# Patient Record
Sex: Male | Born: 1955
Health system: Southern US, Community
[De-identification: ages and names within clinical notes are randomized; demographics above are authoritative.]

## PROBLEM LIST (undated history)

## (undated) DIAGNOSIS — R011 Cardiac murmur, unspecified: Secondary | ICD-10-CM

## (undated) DIAGNOSIS — I5032 Chronic diastolic (congestive) heart failure: Secondary | ICD-10-CM

## (undated) DIAGNOSIS — E669 Obesity, unspecified: Secondary | ICD-10-CM

## (undated) DIAGNOSIS — F32A Depression, unspecified: Secondary | ICD-10-CM

## (undated) DIAGNOSIS — E785 Hyperlipidemia, unspecified: Secondary | ICD-10-CM

## (undated) DIAGNOSIS — I2510 Atherosclerotic heart disease of native coronary artery without angina pectoris: Secondary | ICD-10-CM

## (undated) DIAGNOSIS — I6529 Occlusion and stenosis of unspecified carotid artery: Secondary | ICD-10-CM

## (undated) DIAGNOSIS — N329 Bladder disorder, unspecified: Secondary | ICD-10-CM

## (undated) DIAGNOSIS — I219 Acute myocardial infarction, unspecified: Secondary | ICD-10-CM

## (undated) DIAGNOSIS — I712 Thoracic aortic aneurysm, without rupture: Secondary | ICD-10-CM

## (undated) DIAGNOSIS — I350 Nonrheumatic aortic (valve) stenosis: Secondary | ICD-10-CM

## (undated) DIAGNOSIS — K219 Gastro-esophageal reflux disease without esophagitis: Secondary | ICD-10-CM

## (undated) DIAGNOSIS — R42 Dizziness and giddiness: Secondary | ICD-10-CM

## (undated) DIAGNOSIS — C801 Malignant (primary) neoplasm, unspecified: Secondary | ICD-10-CM

## (undated) DIAGNOSIS — G4730 Sleep apnea, unspecified: Secondary | ICD-10-CM

## (undated) DIAGNOSIS — M1990 Unspecified osteoarthritis, unspecified site: Secondary | ICD-10-CM

## (undated) DIAGNOSIS — M2550 Pain in unspecified joint: Secondary | ICD-10-CM

## (undated) DIAGNOSIS — Z860100 Personal history of colon polyps, unspecified: Secondary | ICD-10-CM

## (undated) DIAGNOSIS — R0600 Dyspnea, unspecified: Secondary | ICD-10-CM

## (undated) DIAGNOSIS — G2581 Restless legs syndrome: Secondary | ICD-10-CM

## (undated) DIAGNOSIS — F329 Major depressive disorder, single episode, unspecified: Secondary | ICD-10-CM

## (undated) DIAGNOSIS — I10 Essential (primary) hypertension: Secondary | ICD-10-CM

## (undated) DIAGNOSIS — D126 Benign neoplasm of colon, unspecified: Secondary | ICD-10-CM

## (undated) DIAGNOSIS — R7303 Prediabetes: Secondary | ICD-10-CM

## (undated) DIAGNOSIS — H269 Unspecified cataract: Secondary | ICD-10-CM

## (undated) DIAGNOSIS — F419 Anxiety disorder, unspecified: Secondary | ICD-10-CM

## (undated) DIAGNOSIS — I7121 Aneurysm of the ascending aorta, without rupture: Secondary | ICD-10-CM

## (undated) DIAGNOSIS — T8859XA Other complications of anesthesia, initial encounter: Secondary | ICD-10-CM

## (undated) HISTORY — DX: Anxiety disorder, unspecified: F41.9

## (undated) HISTORY — PX: CATARACT EXTRACTION: SUR2

## (undated) HISTORY — DX: Aneurysm of the ascending aorta, without rupture: I71.21

## (undated) HISTORY — DX: Thoracic aortic aneurysm, without rupture: I71.2

## (undated) HISTORY — DX: Depression, unspecified: F32.A

## (undated) HISTORY — DX: Nonrheumatic aortic (valve) stenosis: I35.0

## (undated) HISTORY — DX: Occlusion and stenosis of unspecified carotid artery: I65.29

## (undated) HISTORY — DX: Chronic diastolic (congestive) heart failure: I50.32

## (undated) HISTORY — DX: Personal history of colon polyps, unspecified: Z86.0100

## (undated) HISTORY — DX: Essential (primary) hypertension: I10

## (undated) HISTORY — DX: Atherosclerotic heart disease of native coronary artery without angina pectoris: I25.10

## (undated) HISTORY — DX: Hyperlipidemia, unspecified: E78.5

## (undated) HISTORY — PX: OTHER SURGICAL HISTORY: SHX169

## (undated) HISTORY — PX: TONSILLECTOMY: SUR1361

## (undated) HISTORY — DX: Unspecified osteoarthritis, unspecified site: M19.90

## (undated) HISTORY — DX: Benign neoplasm of colon, unspecified: D12.6

## (undated) HISTORY — DX: Obesity, unspecified: E66.9

## (undated) HISTORY — DX: Personal history of colonic polyps: Z86.010

## (undated) HISTORY — DX: Cardiac murmur, unspecified: R01.1

## (undated) HISTORY — DX: Major depressive disorder, single episode, unspecified: F32.9

## (undated) SURGERY — ECHOCARDIOGRAM, TRANSESOPHAGEAL
Anesthesia: Moderate Sedation

---
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
## 1997-08-16 ENCOUNTER — Other Ambulatory Visit: Admission: RE | Admit: 1997-08-16 | Discharge: 1997-08-16 | Payer: Self-pay | Admitting: Orthopedic Surgery

## 1998-01-01 ENCOUNTER — Ambulatory Visit (HOSPITAL_COMMUNITY): Admission: RE | Admit: 1998-01-01 | Discharge: 1998-01-01 | Payer: Self-pay | Admitting: Family Medicine

## 1998-02-19 ENCOUNTER — Ambulatory Visit (HOSPITAL_BASED_OUTPATIENT_CLINIC_OR_DEPARTMENT_OTHER): Admission: RE | Admit: 1998-02-19 | Discharge: 1998-02-19 | Payer: Self-pay | Admitting: Orthopaedic Surgery

## 2004-03-10 ENCOUNTER — Ambulatory Visit: Payer: Self-pay | Admitting: Family Medicine

## 2004-03-17 ENCOUNTER — Ambulatory Visit: Payer: Self-pay | Admitting: Family Medicine

## 2004-03-24 ENCOUNTER — Ambulatory Visit: Payer: Self-pay

## 2004-03-26 ENCOUNTER — Ambulatory Visit: Payer: Self-pay | Admitting: Gastroenterology

## 2004-04-05 ENCOUNTER — Ambulatory Visit: Payer: Self-pay | Admitting: Gastroenterology

## 2004-04-08 ENCOUNTER — Ambulatory Visit: Payer: Self-pay | Admitting: Family Medicine

## 2004-07-02 ENCOUNTER — Ambulatory Visit: Payer: Self-pay | Admitting: Internal Medicine

## 2005-03-23 ENCOUNTER — Ambulatory Visit: Payer: Self-pay | Admitting: Family Medicine

## 2005-03-25 ENCOUNTER — Emergency Department (HOSPITAL_COMMUNITY): Admission: EM | Admit: 2005-03-25 | Discharge: 2005-03-25 | Payer: Self-pay | Admitting: Emergency Medicine

## 2005-03-28 ENCOUNTER — Ambulatory Visit: Payer: Self-pay | Admitting: Family Medicine

## 2005-04-05 ENCOUNTER — Ambulatory Visit: Payer: Self-pay

## 2005-04-11 ENCOUNTER — Ambulatory Visit: Payer: Self-pay

## 2005-06-23 ENCOUNTER — Encounter: Admission: RE | Admit: 2005-06-23 | Discharge: 2005-06-23 | Payer: Self-pay | Admitting: Family Medicine

## 2005-07-07 ENCOUNTER — Ambulatory Visit: Payer: Self-pay | Admitting: Family Medicine

## 2005-08-01 ENCOUNTER — Encounter: Admission: RE | Admit: 2005-08-01 | Discharge: 2005-08-01 | Payer: Self-pay | Admitting: Otolaryngology

## 2005-08-01 ENCOUNTER — Other Ambulatory Visit: Admission: RE | Admit: 2005-08-01 | Discharge: 2005-08-01 | Payer: Self-pay | Admitting: Interventional Radiology

## 2005-08-01 ENCOUNTER — Encounter (INDEPENDENT_AMBULATORY_CARE_PROVIDER_SITE_OTHER): Payer: Self-pay | Admitting: *Deleted

## 2005-11-17 ENCOUNTER — Ambulatory Visit: Payer: Self-pay | Admitting: Family Medicine

## 2007-01-06 ENCOUNTER — Emergency Department (HOSPITAL_COMMUNITY): Admission: EM | Admit: 2007-01-06 | Discharge: 2007-01-06 | Payer: Self-pay | Admitting: Emergency Medicine

## 2007-01-09 DIAGNOSIS — F411 Generalized anxiety disorder: Secondary | ICD-10-CM | POA: Insufficient documentation

## 2007-01-25 ENCOUNTER — Telehealth: Payer: Self-pay | Admitting: Family Medicine

## 2007-03-16 ENCOUNTER — Ambulatory Visit: Payer: Self-pay | Admitting: Family Medicine

## 2007-03-16 LAB — CONVERTED CEMR LAB
Glucose, Urine, Semiquant: NEGATIVE
Nitrite: NEGATIVE
Protein, U semiquant: NEGATIVE
Specific Gravity, Urine: 1.015
Urobilinogen, UA: 0.2
WBC Urine, dipstick: NEGATIVE

## 2007-03-20 LAB — CONVERTED CEMR LAB
AST: 18 units/L (ref 0–37)
Alkaline Phosphatase: 82 units/L (ref 39–117)
Basophils Relative: 0.5 % (ref 0.0–1.0)
CO2: 30 meq/L (ref 19–32)
Chloride: 105 meq/L (ref 96–112)
Creatinine, Ser: 1 mg/dL (ref 0.4–1.5)
Direct LDL: 145.6 mg/dL
HCT: 45.4 % (ref 39.0–52.0)
Hemoglobin: 16 g/dL (ref 13.0–17.0)
Monocytes Absolute: 0.5 10*3/uL (ref 0.2–0.7)
Neutrophils Relative %: 58.8 % (ref 43.0–77.0)
PSA: 1.74 ng/mL (ref 0.10–4.00)
Potassium: 5 meq/L (ref 3.5–5.1)
RDW: 12.6 % (ref 11.5–14.6)
Sodium: 142 meq/L (ref 135–145)
Total Bilirubin: 1.6 mg/dL — ABNORMAL HIGH (ref 0.3–1.2)
Total CHOL/HDL Ratio: 5.5
Total Protein: 7.1 g/dL (ref 6.0–8.3)
VLDL: 43 mg/dL — ABNORMAL HIGH (ref 0–40)
WBC: 7.6 10*3/uL (ref 4.5–10.5)

## 2007-03-23 ENCOUNTER — Ambulatory Visit: Payer: Self-pay | Admitting: Family Medicine

## 2007-03-27 ENCOUNTER — Telehealth: Payer: Self-pay | Admitting: Family Medicine

## 2007-04-04 ENCOUNTER — Telehealth (INDEPENDENT_AMBULATORY_CARE_PROVIDER_SITE_OTHER): Payer: Self-pay | Admitting: *Deleted

## 2007-10-03 ENCOUNTER — Telehealth: Payer: Self-pay | Admitting: Family Medicine

## 2008-02-07 ENCOUNTER — Ambulatory Visit: Payer: Self-pay | Admitting: Family Medicine

## 2008-02-07 DIAGNOSIS — I6529 Occlusion and stenosis of unspecified carotid artery: Secondary | ICD-10-CM | POA: Insufficient documentation

## 2008-02-07 DIAGNOSIS — D126 Benign neoplasm of colon, unspecified: Secondary | ICD-10-CM | POA: Insufficient documentation

## 2008-02-08 ENCOUNTER — Encounter: Payer: Self-pay | Admitting: Family Medicine

## 2008-02-22 ENCOUNTER — Ambulatory Visit: Payer: Self-pay

## 2008-02-22 ENCOUNTER — Encounter: Payer: Self-pay | Admitting: Family Medicine

## 2008-05-16 ENCOUNTER — Ambulatory Visit: Payer: Self-pay | Admitting: Family Medicine

## 2008-05-19 ENCOUNTER — Telehealth: Payer: Self-pay | Admitting: Family Medicine

## 2008-05-20 ENCOUNTER — Telehealth: Payer: Self-pay | Admitting: Gastroenterology

## 2008-05-22 ENCOUNTER — Ambulatory Visit: Payer: Self-pay | Admitting: Gastroenterology

## 2008-05-22 DIAGNOSIS — K648 Other hemorrhoids: Secondary | ICD-10-CM | POA: Insufficient documentation

## 2008-05-22 LAB — CONVERTED CEMR LAB
Basophils Absolute: 0 10*3/uL (ref 0.0–0.1)
Basophils Relative: 0 % (ref 0.0–3.0)
Eosinophils Absolute: 0.2 10*3/uL (ref 0.0–0.7)
Ferritin: 123.3 ng/mL (ref 22.0–322.0)
Hemoglobin: 15.4 g/dL (ref 13.0–17.0)
Iron: 43 ug/dL (ref 42–165)
Lymphocytes Relative: 27.8 % (ref 12.0–46.0)
MCHC: 35.2 g/dL (ref 30.0–36.0)
MCV: 89.9 fL (ref 78.0–100.0)
Neutro Abs: 6 10*3/uL (ref 1.4–7.7)
RDW: 12.8 % (ref 11.5–14.6)

## 2008-05-23 ENCOUNTER — Ambulatory Visit: Payer: Self-pay | Admitting: Gastroenterology

## 2008-05-23 HISTORY — PX: OTHER SURGICAL HISTORY: SHX169

## 2008-06-18 ENCOUNTER — Telehealth: Payer: Self-pay | Admitting: Gastroenterology

## 2008-06-24 ENCOUNTER — Encounter: Payer: Self-pay | Admitting: Family Medicine

## 2008-07-18 ENCOUNTER — Telehealth: Payer: Self-pay | Admitting: Family Medicine

## 2008-08-01 ENCOUNTER — Ambulatory Visit: Payer: Self-pay | Admitting: Family Medicine

## 2008-08-01 DIAGNOSIS — N410 Acute prostatitis: Secondary | ICD-10-CM

## 2008-08-01 LAB — CONVERTED CEMR LAB
Bilirubin Urine: NEGATIVE
Glucose, Urine, Semiquant: NEGATIVE
Ketones, urine, test strip: NEGATIVE
Protein, U semiquant: NEGATIVE
Urobilinogen, UA: 0.2
pH: 7.5

## 2008-10-24 ENCOUNTER — Telehealth: Payer: Self-pay | Admitting: Family Medicine

## 2009-02-13 ENCOUNTER — Encounter (INDEPENDENT_AMBULATORY_CARE_PROVIDER_SITE_OTHER): Payer: Self-pay | Admitting: *Deleted

## 2009-03-04 ENCOUNTER — Telehealth: Payer: Self-pay | Admitting: Family Medicine

## 2009-03-13 ENCOUNTER — Ambulatory Visit (HOSPITAL_COMMUNITY): Admission: RE | Admit: 2009-03-13 | Discharge: 2009-03-14 | Payer: Self-pay | Admitting: Orthopaedic Surgery

## 2009-03-23 HISTORY — PX: NECK SURGERY: SHX720

## 2009-04-06 ENCOUNTER — Telehealth: Payer: Self-pay | Admitting: Gastroenterology

## 2009-04-07 ENCOUNTER — Ambulatory Visit: Payer: Self-pay | Admitting: Gastroenterology

## 2009-04-23 ENCOUNTER — Ambulatory Visit: Payer: Self-pay | Admitting: Gastroenterology

## 2009-04-23 DIAGNOSIS — K602 Anal fissure, unspecified: Secondary | ICD-10-CM | POA: Insufficient documentation

## 2009-09-28 ENCOUNTER — Encounter: Admission: RE | Admit: 2009-09-28 | Discharge: 2009-09-28 | Payer: Self-pay | Admitting: Family Medicine

## 2009-10-07 ENCOUNTER — Telehealth: Payer: Self-pay | Admitting: Family Medicine

## 2010-04-25 DIAGNOSIS — I219 Acute myocardial infarction, unspecified: Secondary | ICD-10-CM

## 2010-04-25 HISTORY — PX: CORONARY ANGIOPLASTY: SHX604

## 2010-04-25 HISTORY — DX: Acute myocardial infarction, unspecified: I21.9

## 2010-05-25 NOTE — Progress Notes (Signed)
Summary: refill Valium  Phone Note Refill Request Message from:  Fax from Pharmacy on October 07, 2009 3:03 PM  Refills Requested: Medication #1:  VALIUM 5 MG  TABS two times a day as needed anxiety   Dosage confirmed as above?Dosage Confirmed   Supply Requested: 1 month   Last Refilled: 04/27/2009  Method Requested: Fax to Local Pharmacy Initial call taken by: Raechel Ache, RN,  October 07, 2009 3:04 PM Caller: [facility]  [address] [facility]  Follow-up for Phone Call        call in #60 with 2 rf. He needs to see me soon, it has been over a year Follow-up by: Nelwyn Salisbury MD,  October 08, 2009 8:32 AM  Additional Follow-up for Phone Call Additional follow up Details #1::        Rx faxed to pharmacy Additional Follow-up by: Raechel Ache, RN,  October 08, 2009 9:44 AM    Prescriptions: VALIUM 5 MG  TABS (DIAZEPAM) two times a day as needed anxiety  #60 x 2   Entered by:   Raechel Ache, RN   Authorized by:   Nelwyn Salisbury MD   Signed by:   Raechel Ache, RN on 10/08/2009   Method used:   Historical   RxID:   [id number]

## 2010-07-28 ENCOUNTER — Other Ambulatory Visit: Payer: Self-pay

## 2010-07-28 LAB — DIFFERENTIAL
Basophils Absolute: 0 10*3/uL (ref 0.0–0.1)
Basophils Relative: 0 % (ref 0–1)
Eosinophils Relative: 3 % (ref 0–5)
Lymphocytes Relative: 30 % (ref 12–46)
Monocytes Absolute: 0.5 10*3/uL (ref 0.1–1.0)
Neutro Abs: 5.3 10*3/uL (ref 1.7–7.7)

## 2010-07-28 LAB — CBC
HCT: 45.7 % (ref 39.0–52.0)
Hemoglobin: 15.8 g/dL (ref 13.0–17.0)
MCV: 92.1 fL (ref 78.0–100.0)
Platelets: 172 10*3/uL (ref 150–400)
RBC: 4.96 MIL/uL (ref 4.22–5.81)
WBC: 8.7 10*3/uL (ref 4.0–10.5)

## 2010-07-28 LAB — COMPREHENSIVE METABOLIC PANEL
Albumin: 4 g/dL (ref 3.5–5.2)
Alkaline Phosphatase: 84 U/L (ref 39–117)
BUN: 12 mg/dL (ref 6–23)
CO2: 27 mEq/L (ref 19–32)
Chloride: 105 mEq/L (ref 96–112)
Creatinine, Ser: 0.91 mg/dL (ref 0.4–1.5)
GFR calc non Af Amer: >60 mL/min (ref >60)
Potassium: 4.4 mEq/L (ref 3.5–5.1)
Total Bilirubin: 0.8 mg/dL (ref 0.3–1.2)

## 2010-07-28 LAB — URINALYSIS, ROUTINE W REFLEX MICROSCOPIC
Bilirubin Urine: NEGATIVE
Ketones, ur: NEGATIVE mg/dL
Leukocytes, UA: NEGATIVE
Nitrite: NEGATIVE
Specific Gravity, Urine: 1.005 (ref 1.005–1.030)
Urobilinogen, UA: 0.2 mg/dL (ref 0.0–1.0)
pH: 6.5 (ref 5.0–8.0)

## 2010-07-28 NOTE — Telephone Encounter (Signed)
Called [facility] and rx denied

## 2010-07-28 NOTE — Telephone Encounter (Signed)
NO, he needs an OV. I have not seen him in 2 years

## 2010-11-22 ENCOUNTER — Encounter: Payer: Self-pay | Admitting: Family Medicine

## 2010-11-22 ENCOUNTER — Ambulatory Visit (INDEPENDENT_AMBULATORY_CARE_PROVIDER_SITE_OTHER): Payer: BC Managed Care – PPO | Admitting: Family Medicine

## 2010-11-22 DIAGNOSIS — R319 Hematuria, unspecified: Secondary | ICD-10-CM

## 2010-11-22 DIAGNOSIS — R079 Chest pain, unspecified: Secondary | ICD-10-CM

## 2010-11-22 DIAGNOSIS — R011 Cardiac murmur, unspecified: Secondary | ICD-10-CM

## 2010-11-22 LAB — BASIC METABOLIC PANEL
BUN: 15 mg/dL (ref 6–23)
Calcium: 9.3 mg/dL (ref 8.4–10.5)
GFR: 100.16 mL/min (ref >60.00)
Glucose, Bld: 80 mg/dL (ref 70–99)

## 2010-11-22 LAB — CBC WITH DIFFERENTIAL/PLATELET
Eosinophils Relative: 6.4 % — ABNORMAL HIGH (ref 0.0–5.0)
HCT: 44.9 % (ref 39.0–52.0)
Lymphocytes Relative: 26.2 % (ref 12.0–46.0)
Lymphs Abs: 2.4 10*3/uL (ref 0.7–4.0)
Monocytes Relative: 6.3 % (ref 3.0–12.0)
Neutrophils Relative %: 60.7 % (ref 43.0–77.0)
Platelets: 156 10*3/uL (ref 150.0–400.0)
WBC: 9.2 10*3/uL (ref 4.5–10.5)

## 2010-11-22 LAB — POCT URINALYSIS DIPSTICK
Bilirubin, UA: NEGATIVE
Glucose, UA: NEGATIVE
Ketones, UA: NEGATIVE
Leukocytes, UA: NEGATIVE
Nitrite, UA: NEGATIVE

## 2010-11-22 LAB — TSH: TSH: 1.23 u[IU]/mL (ref 0.35–5.50)

## 2010-11-22 LAB — HEPATIC FUNCTION PANEL
Albumin: 4.2 g/dL (ref 3.5–5.2)
Total Bilirubin: 1 mg/dL (ref 0.3–1.2)

## 2010-11-22 MED ORDER — DIAZEPAM 5 MG PO TABS: 5.0000 mg | ORAL_TABLET | Freq: Four times a day (QID) | ORAL | Status: DC | PRN

## 2010-11-22 NOTE — Progress Notes (Signed)
Addended by: Gershon Crane A on: 11/22/2010 11:03 AM   Modules accepted: Orders

## 2010-11-22 NOTE — Progress Notes (Signed)
  Subjective:    Patient ID: Luis [name], male    DOB: 1955/08/06, 55 y.o.   MRN: [id number]  HPI Here for 2 months of chest pressure and SOB during exercise. When exerting himself like mowing the lawn he gets lightheaded, dizzy, and feels his heart pounding. This resolves quickly when he rests. No nausea or sweats.    Review of Systems  Constitutional: Negative.   Respiratory: Positive for chest tightness and shortness of breath. Negative for cough.   Cardiovascular: Positive for chest pain. Negative for palpitations and leg swelling.       Objective:   Physical Exam  Constitutional: He appears well-developed and well-nourished.  Neck: No thyromegaly present.  Cardiovascular: Normal rate, regular rhythm and intact distal pulses.  Exam reveals no gallop and no friction rub.        He has a 4/6 SM over the aortic area and a 2/6 SM over the mitral area. EKG is clear   Lymphadenopathy:    He has no cervical adenopathy.          Assessment & Plan:   His murmur sounds like some aortic stenosis, and this could certainly explain his symptoms. We will set up an ECHO soon and also a stress test to assess his coronaries. He will take it easy for now. Stay on aspirin.

## 2010-11-26 ENCOUNTER — Telehealth: Payer: Self-pay | Admitting: Family Medicine

## 2010-11-26 NOTE — Telephone Encounter (Signed)
Message copied by Baldemar Friday on Fri Nov 26, 2010 12:31 PM ------      Message from: Gershon Crane A      Created: Wed Nov 24, 2010  9:39 AM       Normal except he has a tiny bit of blood in the urine. This may or may not be important. Refer to Urology for microscopic hematuria

## 2010-11-26 NOTE — Progress Notes (Signed)
Addended by: Aniceto Boss A on: 11/26/2010 12:31 PM   Modules accepted: Orders

## 2010-11-26 NOTE — Telephone Encounter (Signed)
Referral sent and pt aware.

## 2010-12-06 ENCOUNTER — Ambulatory Visit (HOSPITAL_BASED_OUTPATIENT_CLINIC_OR_DEPARTMENT_OTHER): Payer: BC Managed Care – PPO | Admitting: Radiology

## 2010-12-06 ENCOUNTER — Ambulatory Visit (HOSPITAL_COMMUNITY): Payer: BC Managed Care – PPO | Attending: Family Medicine | Admitting: Radiology

## 2010-12-06 DIAGNOSIS — R011 Cardiac murmur, unspecified: Secondary | ICD-10-CM

## 2010-12-06 DIAGNOSIS — R072 Precordial pain: Secondary | ICD-10-CM

## 2010-12-06 DIAGNOSIS — R0989 Other specified symptoms and signs involving the circulatory and respiratory systems: Secondary | ICD-10-CM

## 2010-12-06 DIAGNOSIS — I359 Nonrheumatic aortic valve disorder, unspecified: Secondary | ICD-10-CM | POA: Insufficient documentation

## 2010-12-06 DIAGNOSIS — R0602 Shortness of breath: Secondary | ICD-10-CM

## 2010-12-06 DIAGNOSIS — R079 Chest pain, unspecified: Secondary | ICD-10-CM | POA: Insufficient documentation

## 2010-12-06 DIAGNOSIS — R0609 Other forms of dyspnea: Secondary | ICD-10-CM | POA: Insufficient documentation

## 2010-12-06 DIAGNOSIS — R0789 Other chest pain: Secondary | ICD-10-CM

## 2010-12-06 DIAGNOSIS — R42 Dizziness and giddiness: Secondary | ICD-10-CM | POA: Insufficient documentation

## 2010-12-06 MED ORDER — REGADENOSON 0.4 MG/5ML IV SOLN
0.4000 mg | Freq: Once | INTRAVENOUS | Status: AC
  Administered 2010-12-06: 0.4 mg via INTRAVENOUS

## 2010-12-06 MED ORDER — TECHNETIUM TC 99M TETROFOSMIN IV KIT
11.0000 | PACK | Freq: Once | INTRAVENOUS | Status: AC | PRN
  Administered 2010-12-06: 11 via INTRAVENOUS

## 2010-12-06 MED ORDER — TECHNETIUM TC 99M TETROFOSMIN IV KIT
33.0000 | PACK | Freq: Once | INTRAVENOUS | Status: AC | PRN
  Administered 2010-12-06: 33 via INTRAVENOUS

## 2010-12-06 NOTE — Progress Notes (Signed)
[facility] 3 NUCLEAR MED [address] [place] [address] [phone]  Cardiology Nuclear Med Study  Luis [name] is a 55 y.o. male [id number] 10-29-1955   Nuclear Med Background Indication for Stress Test:  Evaluation for Ischemia History:  No previous documented CAD Cardiac Risk Factors: Carotid Disease and Smoker  Symptoms:  Chest Pressure. Dizziness, DOE and Light-Headedness   Nuclear Pre-Procedure Caffeine/Decaff Intake:  8:00pm NPO After: 9:30pm   Lungs:  clear IV 0.9% NS with Angio Cath:  20g  IV Site: R Hand  IV Started by:  Cathlyn Parsons, RN  Chest Size (in):  44 Cup Size: n/a  Height: 5\' 6"  (1.676 m)  Weight:  198 lb (89.812 kg)  BMI:  Body mass index is 31.96 kg/(m^2). Tech Comments:  NA    Nuclear Med Study 1 or 2 day study: 1 day  Stress Test Type:  Treadmill/Lexiscan  Reading MD: Olga Millers, MD  Order Authorizing Provider:  S.Fry  Resting Radionuclide: Technetium 37m Tetrofosmin  Resting Radionuclide Dose: 11.0 mCi   Stress Radionuclide:  Technetium 23m Tetrofosmin  Stress Radionuclide Dose: 33.0 mCi           Stress Protocol Rest HR: 54 Stress HR: 92  Rest BP: 120/79 Stress BP: 167/75  Exercise Time (min): 6:33 METS: 8.70   Predicted Max HR: 164 bpm % Max HR: 56.1 bpm Rate Pressure Product: 86578   Dose of Adenosine (mg):  n/a Dose of Lexiscan: 0.4 mg  Dose of Atropine (mg): n/a Dose of Dobutamine: n/a mcg/kg/min (at max HR)  Stress Test Technologist: Milana Na, EMT-P  Nuclear Technologist:  Domenic Polite, CNMT     Rest Procedure:  Myocardial perfusion imaging was performed at rest 45 minutes following the intravenous administration of Technetium 26m Tetrofosmin. Rest ECG: Sinus Bradycardia  Stress Procedure:  The patient received IV Lexiscan 0.4 mg over 15-seconds with concurrent low level exercise and then Technetium 91m Tetrofosmin was injected at 30-seconds while the patient continued walking  one more minute.  There were no significant changes with Lexiscan.  Quantitative spect images were obtained after a 45-minute delay. Stress ECG: No significant ST segment change suggestive of ischemia.  QPS Raw Data Images: Normal left ventricular size. Stress Images:  There is decreased uptake in the inferior wall. Rest Images:  There is decreased uptake in the inferior wall, less prominent compared to the stress images. Subtraction (SDS):  These findings are consistent with prior inferior infarct and mild peri-infarct ischemia. Transient Ischemic Dilatation (Normal <1.22):  1.0 Lung/Heart Ratio (Normal <0.45):  0.35  Quantitative Gated Spect Images QGS EDV:  119 ml QGS ESV:  51 ml QGS cine images:  Inferior akinesis. QGS EF: 57%  Impression Exercise Capacity:  Lexiscan with low level exercise. BP Response:  Normal blood pressure response. Clinical Symptoms:  No chest pain. ECG Impression:  No significant ST segment change suggestive of ischemia. Comparison with Prior Nuclear Study: Inferior infarct and ischemia new since previous.  Overall Impression:  Abnormal stress nuclear study.   Olga Millers  .

## 2010-12-07 ENCOUNTER — Telehealth: Payer: Self-pay | Admitting: *Deleted

## 2010-12-07 NOTE — Telephone Encounter (Signed)
Wife called and states husband is "not himself".  Vague abdominal complaints with some numbness in his arm.  Not eating and losing weight.  We discussed the Echo results and upcoming Cardiology referral.  Advised her if she feels he is much worse, we will be happy to have him come in and see if Dr. Clent Ridges needs to make this referral STAT, or make changes in his current regimen.  She will call back after talking to pt.

## 2010-12-07 NOTE — Progress Notes (Signed)
nuc med report routed to Dr.Fry 12/07/10 Domenic Polite

## 2010-12-07 NOTE — Telephone Encounter (Signed)
noted 

## 2010-12-08 ENCOUNTER — Ambulatory Visit: Payer: BC Managed Care – PPO | Admitting: Family Medicine

## 2010-12-08 ENCOUNTER — Encounter: Payer: Self-pay | Admitting: *Deleted

## 2010-12-08 ENCOUNTER — Encounter: Payer: Self-pay | Admitting: Family Medicine

## 2010-12-08 ENCOUNTER — Telehealth: Payer: Self-pay | Admitting: *Deleted

## 2010-12-08 ENCOUNTER — Ambulatory Visit (INDEPENDENT_AMBULATORY_CARE_PROVIDER_SITE_OTHER): Payer: BC Managed Care – PPO | Admitting: Physician Assistant

## 2010-12-08 ENCOUNTER — Ambulatory Visit (INDEPENDENT_AMBULATORY_CARE_PROVIDER_SITE_OTHER): Payer: BC Managed Care – PPO | Admitting: Family Medicine

## 2010-12-08 ENCOUNTER — Encounter: Payer: Self-pay | Admitting: Physician Assistant

## 2010-12-08 DIAGNOSIS — F17200 Nicotine dependence, unspecified, uncomplicated: Secondary | ICD-10-CM

## 2010-12-08 DIAGNOSIS — I209 Angina pectoris, unspecified: Secondary | ICD-10-CM

## 2010-12-08 DIAGNOSIS — Z720 Tobacco use: Secondary | ICD-10-CM | POA: Insufficient documentation

## 2010-12-08 DIAGNOSIS — R079 Chest pain, unspecified: Secondary | ICD-10-CM

## 2010-12-08 DIAGNOSIS — I359 Nonrheumatic aortic valve disorder, unspecified: Secondary | ICD-10-CM

## 2010-12-08 DIAGNOSIS — I350 Nonrheumatic aortic (valve) stenosis: Secondary | ICD-10-CM

## 2010-12-08 DIAGNOSIS — R9430 Abnormal result of cardiovascular function study, unspecified: Secondary | ICD-10-CM

## 2010-12-08 DIAGNOSIS — I208 Other forms of angina pectoris: Secondary | ICD-10-CM

## 2010-12-08 DIAGNOSIS — I6529 Occlusion and stenosis of unspecified carotid artery: Secondary | ICD-10-CM

## 2010-12-08 DIAGNOSIS — I2510 Atherosclerotic heart disease of native coronary artery without angina pectoris: Secondary | ICD-10-CM | POA: Insufficient documentation

## 2010-12-08 MED ORDER — METOPROLOL SUCCINATE ER 25 MG PO TB24: 12.5000 mg | ORAL_TABLET | Freq: Every day | ORAL | Status: DC

## 2010-12-08 MED ORDER — ROSUVASTATIN CALCIUM 20 MG PO TABS: 20.0000 mg | ORAL_TABLET | Freq: Every day | ORAL | Status: DC

## 2010-12-08 MED ORDER — NITROGLYCERIN 0.4 MG SL SUBL: 0.4000 mg | SUBLINGUAL_TABLET | SUBLINGUAL | Status: DC | PRN

## 2010-12-08 NOTE — Assessment & Plan Note (Signed)
Proceed to cardiac catheterization as noted.

## 2010-12-08 NOTE — Telephone Encounter (Signed)
He is seen today as in the chart

## 2010-12-08 NOTE — Progress Notes (Signed)
Addended by: Hillis Range on: 12/08/2010 11:14 PM   Modules accepted: Level of Service

## 2010-12-08 NOTE — Progress Notes (Signed)
Spoke with pt and went over results.  

## 2010-12-08 NOTE — Patient Instructions (Addendum)
Your physician has requested that you have a cardiac catheterization DX ANGINA AND AORTIC STENOSIS. [name] Cardiac catheterization is used to diagnose and/or treat various heart conditions. Doctors may recommend this procedure for a number of different reasons. The most common reason is to evaluate chest pain. Chest pain can be a symptom of coronary artery disease (CAD), and cardiac catheterization can show whether plaque is narrowing or blocking your heart's arteries. This procedure is also used to evaluate the valves, as well as measure the blood flow and oxygen levels in different parts of your heart. For further information please visit [email or web address] Please follow instruction sheet, as given.   Your physician has recommended you make the following change in your medication: START TOPROL XL 25 MG TAKE 1/2 (HALF) TABLET DAILY; START CRESTOR 20 MG 1 TABLET DAILY; A PRESCRIPTION FOR NITROGLYCERIN 0.4 MG SL HAS BEEN SENT TO YOUR PHARMACY TODAY.  CONTINUE ON ASPIRIN  IF YOUR SYMPTOMS SHOULD GET WORSE YOU HAVE BEEN INSTRUCTED TO GO TO THE EMERGENCY DEPT AS PER SCOTT WEAVER, PA-C.  Your physician recommends that you return for lab work in: 12/09/10 STAT PRE CATH LABS, BMET, CBC W/DIFF, PT/INR

## 2010-12-08 NOTE — Assessment & Plan Note (Signed)
He will need followup Dopplers.  This can be arranged after his catheterization.

## 2010-12-08 NOTE — Telephone Encounter (Addendum)
Family members continue to call asking why we have not gotten pt's results and a appt with Cardiology.  They state pt continues with chest pain, fatigue and numbness in arm.  Scheduled appt today with Dr. Clent Ridges to discuss plan of treatment.

## 2010-12-08 NOTE — Assessment & Plan Note (Signed)
He has been advised to quit 

## 2010-12-08 NOTE — Progress Notes (Signed)
History of Present Illness: Luis [name] is a 55 y.o. male presents as a new patient for chest pain and an abnormal stress test.  He has a h/o mild AS by echo in 2005.  He had carotid dopplers in 2009 that demonstrated 0-39% bilat ICA stenosis.  He denies a h/o DM, HTN or HLP.  He smokes 1 PPD of cigarettes for the last 30+ years.  Over the last 2 mos he has developed progressively worsening substernal chest pain with radiation to his throat and jaw with exertion.  He now gets symptoms with just minimal exertion.  He notes associated dyspnea, headache, nausea.  He denies diaphoresis.  He denies syncope.  Feels weak.  No orthopnea, PND or edema.  He denies rest symptoms.  He was set up for an echocardiogram which was done 8/13: Mild LVH, EF 55-65%, grade 2 diastolic dysfunction, moderate aortic stenosis with a mean gradient of 22 mmHg, mild aortic regurgitation, mild LAE, PASP 34.  He had a Myoview done the same day which demonstrated inferior infarct with mild peri-infarct ischemia with an EF of 57%.  He was referred for further evaluation.  Past Medical History  Diagnosis Date  . Aortic stenosis, mild 03-24-04    per ECHO   . ED (erectile dysfunction)   . Anxiety   . Normal cardiac stress test 04/11/05  . Carotid stenosis     11/09 dopplers with 0-39% bilat. ICA    Current Outpatient Prescriptions  Medication Sig Dispense Refill  . aspirin 81 MG tablet Take 81 mg by mouth daily.        . diazepam (VALIUM) 5 MG tablet Take 1 tablet (5 mg total) by mouth every 6 (six) hours as needed.  60 tablet  5  . lidocaine-hydrocortisone (ANAMANTLE HC) 3-0.5 % CREA Place 1 Applicatorful rectally 2 (two) times daily.        . tadalafil (CIALIS) 20 MG tablet Take 20 mg by mouth daily as needed.          Allergies: No Known Allergies  Social Hx:  Smoker (1ppd x 30+yrs); occ ETOH; no drugs; Married; 4 kids; Journalist, newspaper  Family Hx:  Mom died with Lung CA; Aunts and Uncles with "heart  trouble"  ROS:  Please see the history of present illness.  He has had some chills.  He had hematuria diagnosed recently on a urinalysis and is being referred to urology.  He has lost 7 pounds recently.  All other systems reviewed and negative.   Vital Signs: BP 114/78  Pulse 65  Ht 5\' 6"  (1.676 m)  Wt 198 lb (89.812 kg)  BMI 31.96 kg/m2  PHYSICAL EXAM: Well nourished, well developed, in no acute distress HEENT: normal Neck: no JVD Endocrine: No thyromegaly Vascular: DP/PT 1+ bilaterally, No femoral artery bruits bilaterally, carotids are 1+ bilaterally, no carotid bruits or radiating murmur noted Cardiac:  normal S1, Diminished S2; RRR; 2/6 harsh crescendo decrescendo systolic murmur Heard best at the right upper sternal border and left lower sternal border Lungs:  clear to auscultation bilaterally, no wheezing, rhonchi or rales Abd: soft, nontender, no hepatomegaly Ext: no edema Skin: warm and dry Neuro:  CNs 2-12 intact, no focal abnormalities noted Psych: Normal affect  EKG:  Sinus rhythm, heart rate 65, left axis deviation, poor R-wave progression, nonspecific ST-T wave changes  ASSESSMENT AND PLAN:

## 2010-12-08 NOTE — Progress Notes (Signed)
  Subjective:    Patient ID: Luis [name], male    DOB: 1955-06-10, 55 y.o.   MRN: [id number]  HPI Here for follow up of exertional chest pains. We saw him last week and set him up for a stress test and an ECHO, both of which he had on Monday. This showed an infarct surrounded by a zone of ischemia inferiorly. He was told that we would do a referral for him to see Cardiology but he was not informed of the results. He has continued to have mild intermittent chest discomfort. I was not aware of the stress test results until we looked them up today.    Review of Systems  Constitutional: Positive for fatigue.  Respiratory: Negative for cough, choking, chest tightness and wheezing.   Cardiovascular: Negative for palpitations and leg swelling.       Objective:   Physical Exam  Constitutional: He appears well-developed and well-nourished.  Cardiovascular: Normal rate, regular rhythm, normal heart sounds and intact distal pulses.  Exam reveals no gallop and no friction rub.        3/6 SM as before           Assessment & Plan:  He has angina and an abnormal stress test. We will get him worked in to see cardiology today.

## 2010-12-08 NOTE — Assessment & Plan Note (Addendum)
He is describing class IV angina.  He has an abnormal stress test.  He also has moderate aortic stenosis.  I have recommended cardiac catheterization to him today.  I discussed this with Dr. Johney Frame who also agreed.  Given his aortic stenosis, we will set him up for a right and left heart catheterization.  I will place him on a small dose of beta blocker with Toprol-XL 25 mg one half tablet daily.  I will also start him on lipid therapy with Crestor 20 mg a day.  He will be given nitroglycerin to use as needed.  He will remain on aspirin.  He has been advised to go the emergency room should his chest symptoms change.  He will be set up for cardiac catheterization in the next one to 2 days.  Risks and benefits of cardiac catheterization have been discussed with the patient.  These include bleeding, infection, kidney damage, stroke, heart attack, death.  The patient understands these risks and is willing to proceed.             I have seen patient with Luis [name] and formulated plan with him.  I agree with all of the above. Pts symptoms are concerning for progressive angina.  His aortic stenosis is at least moderate but probably not severe by exam. I agree that RHC/ LHC is the most prudent next step.  This will be performed later this week. He will start ASA, metoprolol, and statin.  slNTG also prescribed. He will call 911 immediately if chest pain worsens in the interim.

## 2010-12-09 ENCOUNTER — Other Ambulatory Visit (INDEPENDENT_AMBULATORY_CARE_PROVIDER_SITE_OTHER): Payer: BC Managed Care – PPO | Admitting: *Deleted

## 2010-12-09 DIAGNOSIS — R9430 Abnormal result of cardiovascular function study, unspecified: Secondary | ICD-10-CM

## 2010-12-09 DIAGNOSIS — I359 Nonrheumatic aortic valve disorder, unspecified: Secondary | ICD-10-CM

## 2010-12-09 DIAGNOSIS — I350 Nonrheumatic aortic (valve) stenosis: Secondary | ICD-10-CM

## 2010-12-09 DIAGNOSIS — I208 Other forms of angina pectoris: Secondary | ICD-10-CM

## 2010-12-09 DIAGNOSIS — I209 Angina pectoris, unspecified: Secondary | ICD-10-CM

## 2010-12-09 LAB — CBC WITH DIFFERENTIAL/PLATELET
Basophils Relative: 1.1 % (ref 0.0–3.0)
Eosinophils Absolute: 0.4 10*3/uL (ref 0.0–0.7)
Eosinophils Relative: 8.3 % — ABNORMAL HIGH (ref 0.0–5.0)
Hemoglobin: 15 g/dL (ref 13.0–17.0)
Lymphocytes Relative: 30.3 % (ref 12.0–46.0)
MCHC: 34.2 g/dL (ref 30.0–36.0)
Monocytes Relative: 17 % — ABNORMAL HIGH (ref 3.0–12.0)
Neutro Abs: 2.3 10*3/uL (ref 1.4–7.7)
RBC: 4.87 Mil/uL (ref 4.22–5.81)
WBC: 5.2 10*3/uL (ref 4.5–10.5)

## 2010-12-09 LAB — PROTIME-INR
INR: 1 ratio (ref 0.8–1.0)
Prothrombin Time: 11.3 s (ref 10.2–12.4)

## 2010-12-09 LAB — BASIC METABOLIC PANEL
CO2: 24 mEq/L (ref 19–32)
Calcium: 8.7 mg/dL (ref 8.4–10.5)
Sodium: 134 mEq/L — ABNORMAL LOW (ref 135–145)

## 2010-12-10 ENCOUNTER — Inpatient Hospital Stay (HOSPITAL_COMMUNITY)
Admission: AD | Admit: 2010-12-10 | Discharge: 2010-12-11 | DRG: 854 | Disposition: A | Discharge status: Home / Self Care | Payer: BC Managed Care – PPO | Source: Ambulatory Visit | Attending: Internal Medicine | Admitting: Internal Medicine

## 2010-12-10 ENCOUNTER — Inpatient Hospital Stay (HOSPITAL_BASED_OUTPATIENT_CLINIC_OR_DEPARTMENT_OTHER)
Admission: RE | Admit: 2010-12-10 | Discharge: 2010-12-10 | Disposition: A | Discharge status: Hospice | Payer: BC Managed Care – PPO | Source: Ambulatory Visit | Attending: Internal Medicine | Admitting: Internal Medicine

## 2010-12-10 DIAGNOSIS — I2510 Atherosclerotic heart disease of native coronary artery without angina pectoris: Principal | ICD-10-CM | POA: Diagnosis present

## 2010-12-10 DIAGNOSIS — Z01810 Encounter for preprocedural cardiovascular examination: Secondary | ICD-10-CM | POA: Insufficient documentation

## 2010-12-10 DIAGNOSIS — I6529 Occlusion and stenosis of unspecified carotid artery: Secondary | ICD-10-CM | POA: Diagnosis present

## 2010-12-10 DIAGNOSIS — I4901 Ventricular fibrillation: Secondary | ICD-10-CM | POA: Insufficient documentation

## 2010-12-10 DIAGNOSIS — F411 Generalized anxiety disorder: Secondary | ICD-10-CM | POA: Diagnosis present

## 2010-12-10 DIAGNOSIS — Z7982 Long term (current) use of aspirin: Secondary | ICD-10-CM

## 2010-12-10 DIAGNOSIS — I359 Nonrheumatic aortic valve disorder, unspecified: Secondary | ICD-10-CM | POA: Diagnosis present

## 2010-12-10 DIAGNOSIS — I498 Other specified cardiac arrhythmias: Secondary | ICD-10-CM | POA: Diagnosis present

## 2010-12-10 DIAGNOSIS — R0602 Shortness of breath: Secondary | ICD-10-CM | POA: Insufficient documentation

## 2010-12-10 DIAGNOSIS — I700 Atherosclerosis of aorta: Secondary | ICD-10-CM

## 2010-12-10 DIAGNOSIS — F17200 Nicotine dependence, unspecified, uncomplicated: Secondary | ICD-10-CM | POA: Insufficient documentation

## 2010-12-10 DIAGNOSIS — R079 Chest pain, unspecified: Secondary | ICD-10-CM | POA: Insufficient documentation

## 2010-12-10 DIAGNOSIS — N529 Male erectile dysfunction, unspecified: Secondary | ICD-10-CM | POA: Diagnosis present

## 2010-12-10 DIAGNOSIS — I200 Unstable angina: Secondary | ICD-10-CM | POA: Diagnosis present

## 2010-12-10 LAB — POCT I-STAT 3, VENOUS BLOOD GAS (G3P V)
Bicarbonate: 24.2 mEq/L — ABNORMAL HIGH (ref 20.0–24.0)
Bicarbonate: 24.4 mEq/L — ABNORMAL HIGH (ref 20.0–24.0)
TCO2: 26 mmol/L (ref 0–100)
pCO2, Ven: 44 mmHg — ABNORMAL LOW (ref 45.0–50.0)
pCO2, Ven: 44.3 mmHg — ABNORMAL LOW (ref 45.0–50.0)
pH, Ven: 7.35 — ABNORMAL HIGH (ref 7.250–7.300)
pO2, Ven: 38 mmHg (ref 30.0–45.0)
pO2, Ven: 40 mmHg (ref 30.0–45.0)

## 2010-12-10 LAB — CARDIAC PANEL(CRET KIN+CKTOT+MB+TROPI)
Relative Index: 3.8 — ABNORMAL HIGH (ref 0.0–2.5)
Troponin I: 1.21 ng/mL (ref <0.30)

## 2010-12-10 LAB — POCT I-STAT 3, ART BLOOD GAS (G3+)
Acid-base deficit: 2 mmol/L (ref 0.0–2.0)
pH, Arterial: 7.387 (ref 7.350–7.450)

## 2010-12-10 LAB — MRSA PCR SCREENING: MRSA by PCR: NEGATIVE

## 2010-12-11 DIAGNOSIS — I200 Unstable angina: Secondary | ICD-10-CM

## 2010-12-11 LAB — LIPID PANEL
Cholesterol: 162 mg/dL (ref 0–200)
HDL: 27 mg/dL — ABNORMAL LOW (ref >39)
Triglycerides: 250 mg/dL — ABNORMAL HIGH (ref <150)

## 2010-12-11 LAB — BASIC METABOLIC PANEL
BUN: 15 mg/dL (ref 6–23)
CO2: 28 mEq/L (ref 19–32)
Calcium: 8.2 mg/dL — ABNORMAL LOW (ref 8.4–10.5)
Glucose, Bld: 104 mg/dL — ABNORMAL HIGH (ref 70–99)
Sodium: 137 mEq/L (ref 135–145)

## 2010-12-11 LAB — CBC
MCV: 88.5 fL (ref 78.0–100.0)
Platelets: 105 10*3/uL — ABNORMAL LOW (ref 150–400)
RBC: 4.35 MIL/uL (ref 4.22–5.81)
WBC: 6.4 10*3/uL (ref 4.0–10.5)

## 2010-12-11 LAB — CARDIAC PANEL(CRET KIN+CKTOT+MB+TROPI): CK, MB: 3.8 ng/mL (ref 0.3–4.0)

## 2010-12-13 LAB — POCT ACTIVATED CLOTTING TIME: Activated Clotting Time: 474 seconds

## 2010-12-17 ENCOUNTER — Encounter: Payer: BC Managed Care – PPO | Admitting: Internal Medicine

## 2010-12-21 NOTE — Discharge Summary (Signed)
NAMEMarland Kitchen  Luis [name], Luis [name] NO.:  [id number]  MEDICAL RECORD NO.:  [id number]  LOCATION:  2921                         FACILITY:  [facility]  PHYSICIAN:  Bevelyn Buckles. Bensimhon, MDDATE OF BIRTH:  [birth year]  DATE OF ADMISSION:  12/10/2010 DATE OF DISCHARGE:  12/11/2010                              DISCHARGE SUMMARY   PRIMARY CARDIOLOGIST:  Hillis Range, MD.  PRIMARY CARE PROVIDER:  Tera Mater. Clent Ridges, MD.  DISCHARGE DIAGNOSIS:  Unstable angina.  SECONDARY DIAGNOSES: 1. Coronary artery disease status post drug-eluting stent placement x2     to the right coronary artery this admission. 2. Ventricular tachycardia/fibrillation during right coronary artery     angiography, requiring defibrillation x1. 3. Mild aortic stenosis. 4. Carotid arterial disease. 5. Erectile dysfunction. 6. Anxiety. 7. Ongoing tobacco abuse.  ALLERGIES:  No known drug allergies.  PROCEDURES:  Left heart cardiac catheterization from December 10, 2010, showing 40% stenoses in the distal left circumflex and mid RCA as well as a 99% stenosis in the distal RCA.  During right coronary artery injection, the patient did have episode of ventricular tachycardia/ventricular fibrillation, which required defibrillation x1. EF was 55%-60%.  Renal arteries were patent.  Right heart catheterization was performed showing RA of 6, RV 28/5, (10) PA 20/6, (18) PCWP of 10.  Fick cardiac output of 4.7 liters per minute and an index of 2.4 liters per minute per meter squared.  Aortic valve gradient was 9 mmHg with a calculated area of 2.36.  Films were reviewed in the distal right coronary artery was stented with a 2.5 x 20-mm Promus Element Plus drug-eluting stent as well as a 3.0 x 28-mm Promus Element Plus drug-eluting stent.  HISTORY OF PRESENT ILLNESS:  A 55 year old male without prior cardiac history, who was recently seen in clinic on August 15 secondary to a 2- month history of progressively worsening exertional  substernal chest discomfort with radiation to the throat and jaw.  He underwent echocardiography in the outpatient setting, showing normal LV function with diastolic dysfunction, question of moderate aortic stenosis, as well as a Myoview which showed inferior infarct with mild peri-infarct ischemia, and the EF 57%.  When seen in the office on August 15, decision was made to pursue diagnostic catheterization.  HOSPITAL COURSE:  The patient presented to the [facility] cath lab on August 17 where right and left heart cardiac catheterization were performed showing normal right heart pressures with a 99% stenosis in the distal right coronary artery.  As outlined above, during the right coronary artery angiography, the patient developed ventricular tachycardia/fibrillation and required defibrillation x1.  Films were reviewed and decision was made to pursue percutaneous intervention of the right coronary artery, and this was subsequently treated with two drug-eluting stents as outlined above.  Though a complex procedure, the patient did tolerate procedure well, and this morning, has been ambulating without recurrent symptoms or limitations.  He did have a minor troponin elevation to 1.21 with minimal CK-MB rise to 4.5, however, both enzymes are trending down and the patient has had no additional chest pain.  He will be discharged home today in good condition.  DISCHARGE LABS:  Hemoglobin 13.3, hematocrit 38.5, WCB 6.4, platelets  105.  Sodium 137, potassium 3.7, chloride 104, CO2 28, BUN 15, creatinine 0.88, glucose 104, calcium 8.2, CK 102, MB 3.8, troponin-I 0.60.  Total cholesterol 162, triglycerides 250, HDL 27, LDL 85.  MRSA screen was negative.  DISPOSITION:  The patient will be discharged home today in good condition.  FOLLOWUP PLANS AND APPOINTMENTS:  The patient will follow up with Dr. Gershon Crane as previously scheduled and we will arrange followup in our office in approximately  2 weeks.  DISCHARGE MEDICATIONS: 1. Plavix 75 mg daily. 2. Rosuvastatin 40 mg at bedtime. 3. Nitroglycerin 0.4 mg sublingual p.r.n. chest pain. 4. Aspirin 81 mg daily. 5. Valium 5 mg q.6 h. p.r.n.  We have discontinued the patient's home dose of beta-blocker given baseline bradycardia and borderline low blood pressures.  OUTSTANDING LAB STUDIES:  None.  DURATION OF DISCHARGE ENCOUNTER:  40 minutes including physician time.     Nicolasa Ducking, ANP   ______________________________ Bevelyn Buckles. Bensimhon, MD    CB/MEDQ  D:  12/11/2010  T:  12/11/2010  Job:  [id number]  cc:   Jeannett Senior A. Clent Ridges, MD  Electronically Signed by Nicolasa Ducking ANP on 12/16/2010 03:02:38 PM Electronically Signed by Arvilla Meres MD on 12/21/2010 05:26:36 PM

## 2010-12-21 NOTE — Cardiovascular Report (Signed)
NAME:  Luis [name], Luis [name] NO.:  [id number]  MEDICAL RECORD NO.:  [id number]  LOCATION:                                 FACILITY:  PHYSICIAN:  Bevelyn Buckles. Bensimhon, MDDATE OF BIRTH:  1956/04/13  DATE OF PROCEDURE:  12/10/2010 DATE OF DISCHARGE:                           CARDIAC CATHETERIZATION   PRIMARY CARDIOLOGIST:  Hillis Range, MD.  PATIENT IDENTIFICATION:  Luis [name] is a 55 year old male with a long history of tobacco use, which is ongoing.  He also has a history of aortic stenosis.  He recently developed chest pain and shortness of breath.  An echocardiogram which showed normal LV function with moderate aortic stenosis with a mean gradient of 22, a Myoview showed inferior infarct with mild peri-infarct ischemia, EF 57%.  He was referred for cardiac catheterization.  PROCEDURES PERFORMED: 1. Right heart cath. 2. Selective coronary angiography. 3. Left heart cath. 4. Left ventriculogram. 5. Abdominal aortogram.  DESCRIPTION OF PROCEDURE:  The risks and indication were explained. Consent was signed and placed on the chart.  Right groin area was prepped and draped in routine sterile fashion.  Anesthetized 1% local lidocaine.  A 4-French arterial sheath was placed in the right femoral artery.  A 7-French venous sheath was placed in the right femoral vein using modified Seldinger technique.  A JL-5, 3-D RCA, and angled pigtail were used for left heart cath.  Swan-Ganz catheter was used for the right heart cath.  After injection of the right coronary artery, the patient developed ventricular tachycardia, ventricular fibrillation, which persisted, and he had to be defibrillated.  He quickly had return of circulation and was alert and oriented.  HEMODYNAMICS:  Right atrial pressure, mean of 6.  RV pressure 28/5 with an EDP of 10.  PA pressure 28/6 with a mean 18.  Pulmonary capillary wedge pressure, mean of 10.  Central aortic pressure 116/69 with a  mean of 90.  LV pressure 124/20 with EDP of 25.  Fick cardiac output 4.7. Cardiac index 2.4.  Aortic valve gradient had a peak of 9 mmHg with an aortic valve area of 2.36 consistent with very mild aortic stenosis.  Left main was normal.  LAD was a long vessel coursing to the apex that gave off a diagonal branch, had minimal plaquing.  Left circumflex gave off a ramus branch and two OMs, it has 40% lesion in the mid AV groove circumflex.  Right coronary artery was a dominant vessel, had a 40% lesion in the mid section around the distal bend prior to the PDA that had a high-grade 99% lesion.  There was diffuse disease throughout the distal RCA bed. There was no evidence of left-to-right or right-to-right collaterals.  Left ventriculogram done in the RAO position showed an EF of 55%-60%. No regional wall motion abnormalities.  Abdominal aorta, the renal arteries were patent bilaterally.  He had mild plaquing throughout the aorta.  There was no evidence of aneurysmal dilatation.  ASSESSMENT: 1. Coronary artery disease with high-grade right coronary lesion as     described above. 2. Normal left ventricular function. 3. Normal right heart pressures. 4. Normal renal arteries with mild aortic plaquing. 5. Very mild aortic stenosis by  cath. 6. Ventricular tachycardia/ventricular fibrillation arrest during     cardiac catheterization with injection of the RCA, requiring     defibrillation x1.  PLAN/DISCUSSION:  We will proceed with percutaneous intervention of the RCA today.  He will need aggressive risk factor modification including smoking cessation.     Bevelyn Buckles. Bensimhon, MD     DRB/MEDQ  D:  12/10/2010  T:  12/10/2010  Job:  [id number]  cc:   Luis Senior A. Clent Ridges, MD Luis Newcomer, PA-C  Electronically Signed by Arvilla Meres MD on 12/21/2010 04:54:09 PM

## 2010-12-23 ENCOUNTER — Encounter: Payer: Self-pay | Admitting: Internal Medicine

## 2010-12-29 ENCOUNTER — Ambulatory Visit (INDEPENDENT_AMBULATORY_CARE_PROVIDER_SITE_OTHER): Payer: BC Managed Care – PPO | Admitting: Internal Medicine

## 2010-12-29 ENCOUNTER — Encounter: Payer: BC Managed Care – PPO | Admitting: Physician Assistant

## 2010-12-29 ENCOUNTER — Telehealth: Payer: Self-pay | Admitting: Internal Medicine

## 2010-12-29 ENCOUNTER — Encounter: Payer: BC Managed Care – PPO | Admitting: Internal Medicine

## 2010-12-29 ENCOUNTER — Encounter: Payer: Self-pay | Admitting: Internal Medicine

## 2010-12-29 DIAGNOSIS — I359 Nonrheumatic aortic valve disorder, unspecified: Secondary | ICD-10-CM

## 2010-12-29 DIAGNOSIS — Z720 Tobacco use: Secondary | ICD-10-CM

## 2010-12-29 DIAGNOSIS — I2510 Atherosclerotic heart disease of native coronary artery without angina pectoris: Secondary | ICD-10-CM

## 2010-12-29 DIAGNOSIS — I6529 Occlusion and stenosis of unspecified carotid artery: Secondary | ICD-10-CM

## 2010-12-29 DIAGNOSIS — I350 Nonrheumatic aortic (valve) stenosis: Secondary | ICD-10-CM

## 2010-12-29 DIAGNOSIS — F17200 Nicotine dependence, unspecified, uncomplicated: Secondary | ICD-10-CM

## 2010-12-29 MED ORDER — VARENICLINE TARTRATE 0.5 MG X 11 & 1 MG X 42 PO MISC: ORAL | Status: DC

## 2010-12-29 NOTE — Telephone Encounter (Signed)
Pt wife calling wanting to know if pt still has any restrictions or if pt can return to work following pt stint implantation. Please return call to discuss.

## 2010-12-29 NOTE — Assessment & Plan Note (Signed)
Mild by recent cath We will follow with serial echos

## 2010-12-29 NOTE — Progress Notes (Signed)
The patient presents today for routine cardiology followup.  Since last being seen in our clinic, the patient underwent PCI of the mid and distal RCA with promus stents by Dr Excell Seltzer.  He has done very well since that time.  His chest pain has resolved.  He is presently without complaint.  His groin has healed without event.  Today, he denies symptoms of palpitations, chest pain, shortness of breath, orthopnea, PND, lower extremity edema, dizziness, presyncope, syncope, or neurologic sequela.  The patient feels that he is tolerating medications without difficulties and is otherwise without complaint today.   Past Medical History  Diagnosis Date  . Aortic stenosis, mild 03-24-04    per cath 8/12  . ED (erectile dysfunction)   . Anxiety   . Carotid stenosis     11/09 dopplers with 0-39% bilat. ICA  . History of colonic polyps     hyperplastic  . Rectal bleeding   . CAD (coronary artery disease)     s/p PCI of the RCA 8/12 with DES by Dr Excell Seltzer, preserved EF  . External hemorrhoids   . Tobacco use disorder    Past Surgical History  Procedure Date  . Tonsillectomy   . Cataract extraction   . Right elbow surgery   . Solonscopy 05/23/08    per Dr. Celene Kras hemorrhoids only, repeat in 5 years  . Neck surgery 03/23/09    per Dr. Ophelia Charter, cervical fusion   . Coronary stenting     s/p PCI of the RCA by Dr Excell Seltzer 8/12 with 2 promus stents    Current Outpatient Prescriptions  Medication Sig Dispense Refill  . aspirin 81 MG tablet Take 81 mg by mouth daily.        . clopidogrel (PLAVIX) 75 MG tablet Take 75 mg by mouth daily.        . diazepam (VALIUM) 5 MG tablet Take 1 tablet (5 mg total) by mouth every 6 (six) hours as needed.  60 tablet  5  . nitroGLYCERIN (NITROSTAT) 0.4 MG SL tablet Place 1 tablet (0.4 mg total) under the tongue every 5 (five) minutes as needed for chest pain.  25 tablet  3  . rosuvastatin (CRESTOR) 40 MG tablet Take 40 mg by mouth daily.        . tadalafil  (CIALIS) 20 MG tablet Take 20 mg by mouth daily as needed.        . varenicline (CHANTIX PAK) 0.5 MG X 11 & 1 MG X 42 tablet Take one 0.5mg  tablet by mouth once daily for 3 days, then increase to one 0.5mg  tablet twice daily for 3 days, then increase to one 1mg  tablet twice daily.  53 tablet  0    No Known Allergies  History   Social History  . Marital Status: Married    Spouse Name: N/A    Number of Children: N/A  . Years of Education: N/A   Occupational History  . [employer]    Social History Main Topics  . Smoking status: Current Everyday Smoker -- 1.0 packs/day for 40 years  . Smokeless tobacco: Never Used   Comment: will prescribe chantix  . Alcohol Use: No  . Drug Use: No  . Sexually Active: Not on file   Other Topics Concern  . Not on file   Social History Narrative  . No narrative on file    Family History  Problem Relation Age of Onset  . Cancer Other     colon  .  Heart disease Other   . Colon cancer Neg Hx     ROS-  All systems are reviewed and are negative except as outlined in the HPI above  Physical Exam: Filed Vitals:   12/29/10 1146  BP: 111/76  Pulse: 65  Height: 5\' 6"  (1.676 m)  Weight: 202 lb (91.627 kg)    GEN- The patient is well appearing, alert and oriented x 3 today.   Head- normocephalic, atraumatic Eyes-  Sclera clear, conjunctiva pink Ears- hearing intact Oropharynx- clear Neck- supple, no JVP Lymph- no cervical lymphadenopathy Lungs- Clear to ausculation bilaterally, normal work of breathing Heart- Regular rate and rhythm, 2/6 mid peaking SEM LUSB GI- soft, NT, ND, + BS Extremities- no clubbing, cyanosis, or edema, groin OK without hematoma or bruit MS- no significant deformity or atrophy Skin- no rash or lesion Psych- euthymic mood, full affect Neuro- strength and sensation are intact  ekg today reveals sinus rhythm 65 bpm, LAD Cath reviewed in detail  Assessment and Plan:

## 2010-12-29 NOTE — Assessment & Plan Note (Signed)
Smoking cessation advised He is willing to try chantix.  He denies suicidal ideations or depression at this time.

## 2010-12-29 NOTE — Assessment & Plan Note (Signed)
Stable s/p recent PCI of the RCA Continue ASA and plavix for at least a year Continue statin Add beta blocker in the future if heart rate increases  Smoking cessation advised

## 2010-12-29 NOTE — Telephone Encounter (Signed)
Resume activity as tolerating No lifting more than 20 pounds until return.

## 2010-12-29 NOTE — Cardiovascular Report (Signed)
NAMEMarland Kitchen  TIMTOHY, BROSKI NO.:  [id number]  MEDICAL RECORD NO.:  [id number]  LOCATION:  2921                         FACILITY:  [facility]  PHYSICIAN:  Veverly Fells. Excell Seltzer, MD  DATE OF BIRTH:  January 25, 1956  DATE OF PROCEDURE:  12/10/2010 DATE OF DISCHARGE:                           CARDIAC CATHETERIZATION   PROCEDURE:  PCI of the right coronary artery with PTCA and stenting.  PROCEDURAL INDICATIONS:  Luis [name] is a 55 year old gentleman who presented with crescendo angina.  He underwent cardiac catheterization by Dr. Gala Romney in the outpatient cath lab.  This demonstrated critical distal RCA stenosis with a 99% subtotal occlusion.  There was TIMI 2 flow into the distal portion of the RCA.  The mid RCA had moderately type diffuse stenosis in the 60% range.  During diagnostic angiography of the right coronary artery, the patient had ventricular fibrillation and had to be defibrillated.  He was transferred to the inpatient cardiac cath lab for PCI.  Risks and indication of the procedure had already been reviewed with the patient.  Informed consent was obtained.  The 4-French sheath in the right femoral artery was changed out to a 7-French sheath and the right femoral vein was changed out to a new 7-French venous sheath.  Angiomax was started for anticoagulation.  The patient was loaded with 600 mg of Plavix.  A JR-4 guide was initially inserted, but there was pressure dampening with this.  A JR-4 side-hole guide was then utilized.  I initially tried to wire the lesion with a Cougar guidewire after a therapeutic ACT had been achieved.  I was unable to cross the lesion with the Cougar wire and in fact this led to total occlusion of the vessel.  The patient actually had minimal symptoms with this.  I was not able to get the wire across and then I attempted with a whisper wire. This also would not cross the lesion.  I then took a 2.0 x 15 mm balloon over the whisper wire in  order to provide support and this provided enough support for the wire to cross the lesion.  However, the balloon would not cross.  The wire was in good position and the balloon was removed.  A 1.2 x 6-mm balloon was then advanced and initially I had trouble crossing but after aggressive seating of the guide, 1.2 mm balloon did cross and 2 inflations were done up to 10 atmospheres.  This restored flow into the vessel.  That balloon was removed and a 2-0 balloon was advanced and dilated as well.  At this point there was a good channel in the vessel and the vessel was rewired with a Cougar wire.  The whisper was pulled out.  The distal lesion was measured. There was a critical lesion followed by a moderate lesion, both in close proximity.  The lesions could both be covered with 20 mm stent and a 2.5 x 20 PROMUS Element stent was chosen.  The stent was carefully positioned and deployed at 12 atmospheres.  It appeared well expanded. The stented segment was then postdilated with a 2.75 mm noncompliant balloon.  There was an excellent result at the lesion site.  The guide  catheter was pulled back into the ostium of the RCA and angiography demonstrated increased irregularity through the midportion of the right coronary artery vessel.  I suspected this was related to aggressive guide seating.  Intracoronary nitroglycerin was given, but the appearance of this did not change.  I elected to stent this area of the vessel as well.  A 3.0 x 28-mm PROMUS Element stent platform was chosen. This provided complete coverage of the mid RCA and the stent was deployed at 14 atmospheres.  There was an excellent angiographic result and the stent was postdilated with 3.25 mm x 20 noncompliant balloon. There was an excellent final result with 0% residual stenosis at both lesion sites.  There were no immediate complications.  FINAL CONCLUSIONS:  Successful complex PCI of subtotal occlusion of the right coronary  artery using 2 drug-eluting stents, one in the mid RCA and one in the distal RCA.  The patient should remain on dual antiplatelet therapy with aspirin and Plavix for minimum of 12 months.     Veverly Fells. Excell Seltzer, MD     MDC/MEDQ  D:  12/11/2010  T:  12/11/2010  Job:  [id number]  Electronically Signed by Luis Bollman MD on 12/29/2010 11:34:52 PM

## 2010-12-29 NOTE — Patient Instructions (Signed)
Your physician has recommended you make the following change in your medication:  1) Start Chantix starter pack as directed.  Your physician has requested that you have a carotid duplex. This test is an ultrasound of the carotid arteries in your neck. It looks at blood flow through these arteries that supply the brain with blood. Allow one hour for this exam. There are no restrictions or special instructions.  Your physician has requested that you have an abdominal aorta duplex. During this test, an ultrasound is used to evaluate the aorta. Allow 30 minutes for this exam. Do not eat after midnight the day before and avoid carbonated beverages  Your physician recommends that you schedule a follow-up appointment in: 2 months.

## 2010-12-29 NOTE — Assessment & Plan Note (Signed)
Doppler from 2009 reviewed We will repeat dopplers and also obtain abdominal US to evaluate for AAA given lifestyle/ tobacco  Smoking cessation advised

## 2010-12-30 NOTE — Telephone Encounter (Signed)
Pt's wife calling re message from yesterday, told her what dr allred said, but she still needs to know if he can rtn to work and when? pls call [phone]

## 2011-01-05 NOTE — Telephone Encounter (Signed)
Return to work  

## 2011-01-05 NOTE — Telephone Encounter (Signed)
Called patient back and spoke with him and he went back to work last [employer]

## 2011-02-02 ENCOUNTER — Encounter (INDEPENDENT_AMBULATORY_CARE_PROVIDER_SITE_OTHER): Payer: BC Managed Care – PPO | Admitting: Cardiology

## 2011-02-02 DIAGNOSIS — F17200 Nicotine dependence, unspecified, uncomplicated: Secondary | ICD-10-CM

## 2011-02-02 DIAGNOSIS — I6529 Occlusion and stenosis of unspecified carotid artery: Secondary | ICD-10-CM

## 2011-02-02 DIAGNOSIS — I700 Atherosclerosis of aorta: Secondary | ICD-10-CM

## 2011-02-05 ENCOUNTER — Other Ambulatory Visit: Payer: Self-pay | Admitting: Internal Medicine

## 2011-02-07 ENCOUNTER — Other Ambulatory Visit: Payer: Self-pay | Admitting: *Deleted

## 2011-02-07 MED ORDER — VARENICLINE TARTRATE 1 MG PO TABS: 1.0000 mg | ORAL_TABLET | Freq: Two times a day (BID) | ORAL | Status: DC

## 2011-02-28 ENCOUNTER — Ambulatory Visit (INDEPENDENT_AMBULATORY_CARE_PROVIDER_SITE_OTHER): Payer: BC Managed Care – PPO | Admitting: Internal Medicine

## 2011-02-28 ENCOUNTER — Encounter: Payer: Self-pay | Admitting: Internal Medicine

## 2011-02-28 VITALS — BP 123/77 | HR 67 | Ht 66.0 in | Wt 210.0 lb

## 2011-02-28 DIAGNOSIS — F17200 Nicotine dependence, unspecified, uncomplicated: Secondary | ICD-10-CM

## 2011-02-28 DIAGNOSIS — I2510 Atherosclerotic heart disease of native coronary artery without angina pectoris: Secondary | ICD-10-CM

## 2011-02-28 DIAGNOSIS — Z720 Tobacco use: Secondary | ICD-10-CM

## 2011-02-28 DIAGNOSIS — E7800 Pure hypercholesterolemia, unspecified: Secondary | ICD-10-CM

## 2011-02-28 DIAGNOSIS — E785 Hyperlipidemia, unspecified: Secondary | ICD-10-CM

## 2011-02-28 NOTE — Assessment & Plan Note (Signed)
Ongoing cessation encouraged today

## 2011-02-28 NOTE — Progress Notes (Signed)
The patient presents today for routine cardiology followup.  Since last being seen in our clinic, the patient has done very well.  His chest pain has resolved s/p PCI and he has resumed all activity.Luis [name]  He is presently without complaint.  He has gained weight with smoking cessation. Today, he denies symptoms of palpitations, chest pain, shortness of breath, orthopnea, PND, lower extremity edema, dizziness, presyncope, syncope, or neurologic sequela.  The patient feels that he is tolerating medications without difficulties and is otherwise without complaint today.   Past Medical History  Diagnosis Date  . Aortic stenosis, mild 03-24-04    per cath 8/12  . ED (erectile dysfunction)   . Anxiety   . Carotid stenosis     11/09 dopplers with 0-39% bilat. ICA  . History of colonic polyps     hyperplastic  . Rectal bleeding   . CAD (coronary artery disease)     s/p PCI of the RCA 8/12 with DES by Dr Excell Seltzer, preserved EF  . External hemorrhoids   . Tobacco use disorder    Past Surgical History  Procedure Date  . Tonsillectomy   . Cataract extraction   . Right elbow surgery   . Solonscopy 05/23/08    per Dr. Celene Kras hemorrhoids only, repeat in 5 years  . Neck surgery 03/23/09    per Dr. Ophelia Charter, cervical fusion   . Coronary stenting     s/p PCI of the RCA by Dr Excell Seltzer 8/12 with 2 promus stents    Current Outpatient Prescriptions  Medication Sig Dispense Refill  . aspirin 81 MG tablet Take 81 mg by mouth daily.        . clopidogrel (PLAVIX) 75 MG tablet Take 75 mg by mouth daily.        . diazepam (VALIUM) 5 MG tablet Take 1 tablet (5 mg total) by mouth every 6 (six) hours as needed.  60 tablet  5  . nitroGLYCERIN (NITROSTAT) 0.4 MG SL tablet Place 1 tablet (0.4 mg total) under the tongue every 5 (five) minutes as needed for chest pain.  25 tablet  3  . rosuvastatin (CRESTOR) 40 MG tablet Take 40 mg by mouth daily.        . tadalafil (CIALIS) 20 MG tablet Take 20 mg by mouth daily  as needed.        . varenicline (CHANTIX) 1 MG tablet Take 1 tablet (1 mg total) by mouth 2 (two) times daily.  60 tablet  3    No Known Allergies  History   Social History  . Marital Status: Married    Spouse Name: N/A    Number of Children: N/A  . Years of Education: N/A   Occupational History  . [employer]    Social History Main Topics  . Smoking status: Former Smoker -- 1.0 packs/day for 40 years  . Smokeless tobacco: Never Used   Comment: will prescribe chantix  . Alcohol Use: No  . Drug Use: No  . Sexually Active: Not on file   Other Topics Concern  . Not on file   Social History Narrative  . No narrative on file    Family History  Problem Relation Age of Onset  . Cancer Other     colon  . Heart disease Other   . Colon cancer Neg Hx    Physical Exam: Filed Vitals:   02/28/11 0929  BP: 123/77  Pulse: 67  Height: 5\' 6"  (1.676 m)  Weight: 210 lb (95.255  kg)    GEN- The patient is well appearing, alert and oriented x 3 today.   Head- normocephalic, atraumatic Eyes-  Sclera clear, conjunctiva pink Ears- hearing intact Oropharynx- clear Neck- supple, no JVP Lymph- no cervical lymphadenopathy Lungs- Clear to ausculation bilaterally, normal work of breathing Heart- Regular rate and rhythm, 2/6 mid peaking SEM LUSB GI- soft, NT, ND, + BS Extremities- no clubbing, cyanosis, or edema, groin OK without hematoma or bruit MS- no significant deformity or atrophy Skin- no rash or lesion Psych- euthymic mood, full affect Neuro- strength and sensation are intact  Cath reviewed in detail Carotid US and abdominal US reviewed from 02/02/11  Assessment and Plan:

## 2011-02-28 NOTE — Assessment & Plan Note (Signed)
Stable No change required today  

## 2011-02-28 NOTE — Assessment & Plan Note (Signed)
Fasting lipids and LFTs 

## 2011-02-28 NOTE — Patient Instructions (Addendum)
Your physician wants you to follow-up in: 6 months with Dr Jacquiline Doe will receive a reminder letter in the mail two months in advance. If you don't receive a letter, please call our office to schedule the follow-up appointment.  Your physician recommends that you return for lab work fasting lipid and liver--when patient can come

## 2011-03-04 ENCOUNTER — Other Ambulatory Visit (INDEPENDENT_AMBULATORY_CARE_PROVIDER_SITE_OTHER): Payer: BC Managed Care – PPO | Admitting: *Deleted

## 2011-03-04 DIAGNOSIS — E7800 Pure hypercholesterolemia, unspecified: Secondary | ICD-10-CM

## 2011-03-04 LAB — HEPATIC FUNCTION PANEL
Alkaline Phosphatase: 78 U/L (ref 39–117)
Bilirubin, Direct: 0.1 mg/dL (ref 0.0–0.3)
Total Protein: 7.1 g/dL (ref 6.0–8.3)

## 2011-03-04 LAB — LIPID PANEL: LDL Cholesterol: 72 mg/dL (ref 0–99)

## 2011-03-15 ENCOUNTER — Telehealth: Payer: Self-pay | Admitting: Internal Medicine

## 2011-03-15 NOTE — Telephone Encounter (Signed)
FU Call: Pt wife calling back wanting to know if pt can switch from crestor to Lipitor. Please return pt call to discuss further.

## 2011-03-15 NOTE — Telephone Encounter (Signed)
Returned call to patient  He just has the Crestor refilled so we have some time but wants to change to another med due to cost if okay with Dr Johney Frame.  He says he has discussed with him before and Dr Johney Frame wanted him to stay on Crestor  I let him know I would as again and call him back next week

## 2011-06-04 ENCOUNTER — Emergency Department (HOSPITAL_COMMUNITY)
Admission: EM | Admit: 2011-06-04 | Discharge: 2011-06-05 | Disposition: A | Discharge status: Home / Self Care | Payer: BC Managed Care – PPO | Attending: Emergency Medicine | Admitting: Emergency Medicine

## 2011-06-04 ENCOUNTER — Other Ambulatory Visit: Payer: Self-pay

## 2011-06-04 ENCOUNTER — Emergency Department (HOSPITAL_COMMUNITY): Payer: BC Managed Care – PPO

## 2011-06-04 ENCOUNTER — Encounter (HOSPITAL_COMMUNITY): Payer: Self-pay

## 2011-06-04 DIAGNOSIS — Z7982 Long term (current) use of aspirin: Secondary | ICD-10-CM | POA: Insufficient documentation

## 2011-06-04 DIAGNOSIS — F411 Generalized anxiety disorder: Secondary | ICD-10-CM | POA: Insufficient documentation

## 2011-06-04 DIAGNOSIS — R0602 Shortness of breath: Secondary | ICD-10-CM | POA: Insufficient documentation

## 2011-06-04 DIAGNOSIS — R1013 Epigastric pain: Secondary | ICD-10-CM | POA: Insufficient documentation

## 2011-06-04 DIAGNOSIS — K8020 Calculus of gallbladder without cholecystitis without obstruction: Secondary | ICD-10-CM | POA: Insufficient documentation

## 2011-06-04 DIAGNOSIS — Z79899 Other long term (current) drug therapy: Secondary | ICD-10-CM | POA: Insufficient documentation

## 2011-06-04 DIAGNOSIS — I2510 Atherosclerotic heart disease of native coronary artery without angina pectoris: Secondary | ICD-10-CM | POA: Insufficient documentation

## 2011-06-04 LAB — CBC
HCT: 39.8 % (ref 39.0–52.0)
Hemoglobin: 14 g/dL (ref 13.0–17.0)
MCH: 30.6 pg (ref 26.0–34.0)
MCV: 87.1 fL (ref 78.0–100.0)
RBC: 4.57 MIL/uL (ref 4.22–5.81)
WBC: 6.7 10*3/uL (ref 4.0–10.5)

## 2011-06-04 LAB — LIPASE, BLOOD: Lipase: 24 U/L (ref 11–59)

## 2011-06-04 LAB — BASIC METABOLIC PANEL
CO2: 27 mEq/L (ref 19–32)
Calcium: 10.5 mg/dL (ref 8.4–10.5)
Chloride: 102 mEq/L (ref 96–112)
Creatinine, Ser: 0.87 mg/dL (ref 0.50–1.35)
Glucose, Bld: 136 mg/dL — ABNORMAL HIGH (ref 70–99)

## 2011-06-04 LAB — HEPATIC FUNCTION PANEL
ALT: 20 U/L (ref 0–53)
Alkaline Phosphatase: 78 U/L (ref 39–117)
Indirect Bilirubin: 0.5 mg/dL (ref 0.3–0.9)
Total Bilirubin: 0.6 mg/dL (ref 0.3–1.2)
Total Protein: 7 g/dL (ref 6.0–8.3)

## 2011-06-04 MED ORDER — GI COCKTAIL ~~LOC~~
30.0000 mL | Freq: Once | ORAL | Status: AC
  Administered 2011-06-04: 30 mL via ORAL
  Filled 2011-06-04: qty 30

## 2011-06-04 NOTE — ED Provider Notes (Signed)
History     CSN: [id number]  Arrival date & time 06/04/11  2109   First MD Initiated Contact with Patient 06/04/11 2133      Chief Complaint  Patient presents with  . Abdominal Pain    with shortness of breath    (Consider location/radiation/quality/duration/timing/severity/associated sxs/prior treatment) HPI  Patient with hx of CAD  s/p PCI of the RCA 8/12 with DES by Dr Excell Seltzer, preserved EF presents to ER with complaint of abdominal pain. Patient states he felt well today and then ate a hot dog for lunch around 11:30pm stating that around 2pm he had acute onset abdominal cramping and pain that after belching improved and he went about his day. Patient states he then had a bean burrito for supper and shortly after had recurrence of abdominal pain stating "it hurt so bad it doubled me over and took my breath away." Patient states this was waxing and waning, cramp like for a few hours and by time of arrival to the ER he had once again belched numerous times stating at time of evaluation, "the pain has gone away again." Patient denies hx of similar episodes. He denies fevers, chills, CP, n/v/d, diaphoresis. He took some TUMs after the second episode stating "not sure if the tums or the belching got rid of the pain."    Past Medical History  Diagnosis Date  . Aortic stenosis, mild 03-24-04    per cath 8/12  . ED (erectile dysfunction)   . Anxiety   . Carotid stenosis     11/09 dopplers with 0-39% bilat. ICA  . History of colonic polyps     hyperplastic  . Rectal bleeding   . CAD (coronary artery disease)     s/p PCI of the RCA 8/12 with DES by Dr Excell Seltzer, preserved EF  . External hemorrhoids   . Tobacco use disorder     quit since 9/12    Past Surgical History  Procedure Date  . Tonsillectomy   . Cataract extraction   . Right elbow surgery   . Solonscopy 05/23/08    per Dr. Celene Kras hemorrhoids only, repeat in 5 years  . Neck surgery 03/23/09    per Dr. Ophelia Charter,  cervical fusion   . Coronary stenting     s/p PCI of the RCA by Dr Excell Seltzer 8/12 with 2 promus stents    Family History  Problem Relation Age of Onset  . Cancer Other     colon  . Heart disease Other   . Colon cancer Neg Hx     History  Substance Use Topics  . Smoking status: Former Smoker -- 1.0 packs/day for 40 years  . Smokeless tobacco: Never Used   Comment: will prescribe chantix  . Alcohol Use: No      Review of Systems  All other systems reviewed and are negative.    Allergies  Review of patient's allergies indicates no known allergies.  Home Medications   Current Outpatient Rx  Name Route Sig Dispense Refill  . ASPIRIN 81 MG PO TABS Oral Take 81 mg by mouth daily.      [name] CLOPIDOGREL BISULFATE 75 MG PO TABS Oral Take 75 mg by mouth daily.      [name] DIAZEPAM 5 MG PO TABS Oral Take 5 mg by mouth every 6 (six) hours as needed. For anxiety.    [name] NITROGLYCERIN 0.4 MG SL SUBL Sublingual Place 0.4 mg under the tongue every 5 (five) minutes as needed. For chest pain.    [name]  ROSUVASTATIN CALCIUM 40 MG PO TABS Oral Take 40 mg by mouth daily.        BP 143/91  Pulse 59  Temp(Src) 97.7 F (36.5 C) (Oral)  Resp 18  SpO2 98%  Physical Exam  Nursing note and vitals reviewed. Constitutional: He is oriented to person, place, and time. He appears well-developed and well-nourished. No distress.  HENT:  Head: Normocephalic and atraumatic.  Eyes: Conjunctivae are normal.  Neck: Normal range of motion. Neck supple.  Cardiovascular: Normal rate, regular rhythm, normal heart sounds and intact distal pulses.  Exam reveals no gallop and no friction rub.   No murmur heard. Pulmonary/Chest: Effort normal and breath sounds normal. No respiratory distress. He has no wheezes. He has no rales. He exhibits no tenderness.  Abdominal: Bowel sounds are normal. He exhibits no distension and no mass. There is no tenderness. There is no rebound and no guarding.  Musculoskeletal: Normal range of  motion. He exhibits no edema and no tenderness.  Neurological: He is alert and oriented to person, place, and time.  Skin: Skin is warm and dry. No rash noted. He is not diaphoretic. No erythema.  Psychiatric: He has a normal mood and affect.    ED Course  Procedures (including critical care time)  GI cocktail.  Labs Reviewed  CBC - Abnormal; Notable for the following:    Platelets 141 (*)    All other components within normal limits  BASIC METABOLIC PANEL - Abnormal; Notable for the following:    Glucose, Bld 136 (*)    All other components within normal limits  TROPONIN I  HEPATIC FUNCTION PANEL  LIPASE, BLOOD   Dg Chest 2 View  06/04/2011  *RADIOLOGY REPORT*  Clinical Data: Shortness of breath.  Abdominal pain today.  History of stents placed in August.  CHEST - 2 VIEW  Comparison: 03/11/2009  Findings: Patient has had prior cervical lesion.  The heart is large.  No evidence for pulmonary edema.  No focal consolidations or pleural effusions.  Mildly prominent interstitial markings, likely chronic.  IMPRESSION: Cardiomegaly.  No edema.  Original Report Authenticated By: Patterson Hammersmith, M.D.     No diagnosis found.    MDM  Sign out given to Ivonne Andrew with patient in [place] with results pending and dispo based on finding. On re-exam before going to [place], patient continued to deny abdominal pain.    Medical screening examination/treatment/procedure(s) were conducted as a shared visit with non-physician practitioner(s) and myself.  I personally evaluated the patient during the encounter Obese late-middle-aged man developed upper abdominal pain after eating a hotdog today.  Prior history of CAD with stenting.  Exam shows nontender abdomen; EKG non-acute.  Advised getting abdominal ultrasound. Osvaldo Human, M.D.      [place], [place] 06/04/11 2357  Carleene Cooper III, MD 06/05/11 [phone]

## 2011-06-04 NOTE — ED Provider Notes (Signed)
11:22 PM  Date: 06/04/2011  Rate: 56  Rhythm: sinus bradycardia  QRS Axis: left  Intervals: PR prolonged QRS:  Poor R wave progression in precordial leads suggests old anterior myocardial infarction. Q waves in III, AVF suggest old inferior myocardial infarction.  ST/T Wave abnormalities: normal  Conduction Disutrbances:none  Narrative Interpretation: Abnormal EKG.  Old EKG Reviewed: unchanged    Carleene Cooper III, MD 06/04/11 2325

## 2011-06-04 NOTE — ED Notes (Signed)
Pt c/o epigastric pain, tender w/ palpation. Pt states that it began to hurt a couple of hours after eating a hotdog today

## 2011-06-05 NOTE — ED Provider Notes (Signed)
Luis [name] 12:00 AM patient discussed in sign out with Drucie Opitz PAC. Patient has ultrasound of abdomen pending to rule out gallstones or cholecystitis. Patient currently pain free with unremarkable abdominal exam. Patient states pain was brief and episodic. He denies similar symptoms previously. Patient states she continues to be comfortable at this time.  12:30 AM ultrasound shows small gallstones. Patient continues to be asymptomatic. He is unremarkable abdominal exam was negative Murphy'[name] sign. His labs are also unremarkable today with no elevated WBC were elevated LFTs. Patient is afebrile. At this time he is ready to return home. Plan to provide general surgery referral.    US Abdomen Complete  06/05/2011  *RADIOLOGY REPORT*  Clinical Data:  Epigastric pain after eating of hot dog.  COMPLETE ABDOMINAL ULTRASOUND  Comparison:  None.  Findings:  Gallbladder:  Small stones and sludge in the dependent portion of the gallbladder.  No significant distension or pericholecystic edema.  Mild thickening of the gallbladder wall, measuring 4 mm. Murphy'[name] sign is negative.  Changes are indeterminate for cholecystitis.  Common bile duct:  Normal caliber, measuring 3 mm diameter.  Liver:  Diffusely heterogeneous liver parenchymal echotexture suggesting fatty infiltration.  No focal lesions identified.  IVC:  Not visualized due to bowel gas.  Pancreas:  Not visualized due to bowel gas.  Spleen:  Spleen length measures 10.8 cm.  No focal parenchymal lesion.  Right Kidney:  Right kidney measures 12.1 cm length.  No hydronephrosis.  Left Kidney:  Left kidney measures 12.1 cm length.  No hydronephrosis.  Suggestion of a small cyst in the lower pole measuring about 1 cm diameter.  Abdominal aorta:  Proximal aorta is obscured.  No distal aneurysm.  IMPRESSION: Small stones and sludge in the gallbladder with minimal gallbladder wall thickening.  Probable fatty infiltration of the liver.  Small left renal cyst.   Original Report Authenticated By: Marlon Pel, M.D.    Medical screening examination/treatment/procedure([name]) were conducted as a shared visit with non-physician practitioner([name]) and myself.  I personally evaluated the patient during the encounter Ultrasound confirmed my suspicion that his symptoms were gall bladder-related. Osvaldo Human, M.D.   Phill Mutter Gackle, PA 06/05/11 1610  Carleene Cooper III, MD 06/05/11 [phone]

## 2011-06-16 ENCOUNTER — Encounter (INDEPENDENT_AMBULATORY_CARE_PROVIDER_SITE_OTHER): Payer: Self-pay

## 2011-06-16 ENCOUNTER — Encounter (INDEPENDENT_AMBULATORY_CARE_PROVIDER_SITE_OTHER): Payer: Self-pay | Admitting: Surgery

## 2011-06-16 ENCOUNTER — Ambulatory Visit (INDEPENDENT_AMBULATORY_CARE_PROVIDER_SITE_OTHER): Payer: BC Managed Care – PPO | Admitting: Surgery

## 2011-06-16 DIAGNOSIS — K829 Disease of gallbladder, unspecified: Secondary | ICD-10-CM | POA: Insufficient documentation

## 2011-06-16 NOTE — Progress Notes (Addendum)
Re:   Luis [name] DOB:   Oct 23, 1958 MRN:   [id number]  ASSESSMENT AND PLAN: 1.  Gall bladder disease.  I discussed with the patient the indications and risks of gall bladder surgery.  The primary risks of gall bladder surgery include, but are not limited to, bleeding, infection, common bile duct injury, and open surgery.  There is also the risk that the patient may have continued symptoms after surgery.  However, the likelihood of improvement in symptoms and return to the patient's normal status is good. We discussed the typical post-operative recovery course. I tried to answer the patient's questions.  I gave the patient literature about gall bladder surgery.  2.  Aortic stenosis, mild. 3.  History of MI (unknown date), PCI of RCA in Aug 2012 by Dr. Excell Seltzer.  Sees Dr. Jamesetta Orleans.  Last seen Dec 2012.  Will need cardiac clearance. [Cleared by Dr. Shela Commons. Allred 06/16/2011. DN 06/29/2011] 4.  Anxiety. 5.  History of hemorrhoids - seen by Dr. Cordelia Poche in the past. 6.  Quit smoking 01/19/2011.   Chief Complaint  Patient presents with  . Pre-op Exam    eval gallbladder with stones   REFERRING PHYSICIAN: Nelwyn Salisbury, MD, MD  HISTORY OF PRESENT ILLNESS: Luis [name] is a 56 y.o. (DOB: 1955/09/04)  white male whose primary care physician is Nelwyn Salisbury, MD, MD and comes to me today for gall bladder disease.  The patient has had no significant abdominal symptoms prior to early February of this year. However around 05 June 2011, he developed severe epigastric abdominal pain. He went to the [facility] emergency room and was evaluated.  His evaluation included ultrasound of his abdomen which showed small stones and sludge in his gallbladder. His liver functions were normal. His lipase was normal. His symptoms have resolved. He's had minimal symptoms since that time. He comes today for discussion of further management of his gallbladder disease.  No history of stomach disease.   No history of liver disease.   No history of pancreas disease.  No history of colon disease.  Had colonoscopy by Dr. Nyra Capes around 2011.   Past Medical History  Diagnosis Date  . Aortic stenosis, mild 03-24-04    per cath 8/12  . ED (erectile dysfunction)   . Anxiety   . Carotid stenosis     11/09 dopplers with 0-39% bilat. ICA  . History of colonic polyps     hyperplastic  . Rectal bleeding   . CAD (coronary artery disease)     s/p PCI of the RCA 8/12 with DES by Dr Excell Seltzer, preserved EF  . External hemorrhoids   . Tobacco use disorder     quit since 9/12  . Heart murmur   . Heart attack   . Bruises easily       Past Surgical History  Procedure Date  . Tonsillectomy   . Cataract extraction   . Right elbow surgery   . Solonscopy 05/23/08    per Dr. Celene Kras hemorrhoids only, repeat in 5 years  . Neck surgery 03/23/09    per Dr. Ophelia Charter, cervical fusion   . Coronary stenting     s/p PCI of the RCA by Dr Excell Seltzer 8/12 with 2 promus stents      Current Outpatient Prescriptions  Medication Sig Dispense Refill  . aspirin 81 MG tablet Take 81 mg by mouth daily.        . clopidogrel (PLAVIX) 75 MG tablet Take  75 mg by mouth daily.        . diazepam (VALIUM) 5 MG tablet Take 5 mg by mouth every 6 (six) hours as needed. For anxiety.      . nitroGLYCERIN (NITROSTAT) 0.4 MG SL tablet Place 0.4 mg under the tongue every 5 (five) minutes as needed. For chest pain.      . rosuvastatin (CRESTOR) 40 MG tablet Take 40 mg by mouth daily.           No Known Allergies  REVIEW OF SYSTEMS: Skin:  No history of rash.  No history of abnormal moles. Infection:  No history of hepatitis or HIV.  No history of MRSA. Neurologic:  No history of stroke.  No history of seizure.  No history of headaches. Cardiac:  Significant CAD.  Needed 2 stents in Aug 2012.  Sees Dr. Jamesetta Orleans.  Has aortic stenosis. Pulmonary:  Quit smoking Sept. 2012.  Chantex helped him quit  smoking.  Endocrine:  No diabetes. No thyroid disease. Gastrointestinal:  See HPI. Urologic:  No history of kidney stones.  No history of bladder infections. Musculoskeletal:  No history of joint or back disease. Hematologic:  No bleeding disorder.  No history of anemia.  Not anticoagulated. Psycho-social:  The patient is oriented.   The patient has no obvious psychologic or social impairment to understanding our conversation and plan.  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 Wife, Inocencio Homes, with patient.  PHYSICAL EXAM: BP 122/82  Pulse 69  Temp(Src) 99.1 F (37.3 C) (Temporal)  Ht 5' 6.5" (1.689 m)  Wt 219 lb 9.6 oz (99.61 kg)  BMI 34.91 kg/m2  SpO2 99%  General: Bearded WN WM who is alert. HEENT: Normal. Pupils equal. Upper dentures. Neck: Supple. No mass.  No thyroid mass.   Lymph Nodes:  No supraclavicular or cervical nodes. Lungs: Clear to auscultation and symmetric breath sounds. Heart:  RRR. 3/6 systolic precordial murmur.  Abdomen: Soft. No mass. No tenderness. No hernia. Normal bowel sounds.  No abdominal scars. Rectal: Not done. Extremities:  Good strength and ROM  in upper and lower extremities. Neurologic:  Grossly intact to motor and sensory function. Psychiatric: Has normal mood and affect. Behavior is normal.   DATA REVIEWED: ER visit data, [place] report.  Ovidio Kin, MD,  [facility] [address] [place]  [address]   [place], [place]    [address] Phone:  [phone] FAX:  [phone]

## 2011-06-20 ENCOUNTER — Ambulatory Visit (INDEPENDENT_AMBULATORY_CARE_PROVIDER_SITE_OTHER): Payer: BC Managed Care – PPO | Admitting: Surgery

## 2011-06-20 ENCOUNTER — Telehealth: Payer: Self-pay | Admitting: Internal Medicine

## 2011-06-20 NOTE — Telephone Encounter (Signed)
New Msg: Pt wife calling wanting to check on status of receipt of surgical clearance for gal stone removal. Please return pt/pt wife call to discuss further.   To contact pt please call: [phone]

## 2011-06-20 NOTE — Telephone Encounter (Signed)
Spoke with the patient and he is wanting to get this done soon.  He has been having attacks.  He could not tell me the name of the surgeon he saw on Thurs.  I have asked him to contact them and have them send over surgical clearance request

## 2011-06-21 NOTE — Telephone Encounter (Signed)
Have form for Dr Johney Frame to fill out when he's in the office tomorrow

## 2011-06-22 ENCOUNTER — Other Ambulatory Visit (INDEPENDENT_AMBULATORY_CARE_PROVIDER_SITE_OTHER): Payer: Self-pay | Admitting: Surgery

## 2011-06-22 NOTE — Telephone Encounter (Signed)
Follow up from previous call:  Status of cardiac clearance.  

## 2011-06-22 NOTE — Telephone Encounter (Signed)
Faxed in for clearance

## 2011-06-22 NOTE — Telephone Encounter (Signed)
Fu from previous call: wife calling to check on status of surgical clearance from dr [facility] please call pt [phone] if NA can call [phone] ok to leave message

## 2011-06-23 ENCOUNTER — Telehealth (INDEPENDENT_AMBULATORY_CARE_PROVIDER_SITE_OTHER): Payer: Self-pay

## 2011-06-23 NOTE — Telephone Encounter (Signed)
I called pt and told him that we received the clearance from Dr Johney Frame to schedule surgery.  Dr Ezzard Standing wants him to stop his Plavix a week ahead of time and the Aspirin 3 days ahead.  The orders are in the computer and I sent the posting sheet to the schedulers.

## 2011-07-08 ENCOUNTER — Other Ambulatory Visit: Payer: Self-pay | Admitting: Cardiovascular Disease

## 2011-07-12 ENCOUNTER — Encounter (HOSPITAL_COMMUNITY): Payer: Self-pay | Admitting: Pharmacy Technician

## 2011-07-14 ENCOUNTER — Encounter (HOSPITAL_COMMUNITY)
Admission: RE | Admit: 2011-07-14 | Discharge: 2011-07-14 | Disposition: A | Discharge status: Home / Self Care | Payer: BC Managed Care – PPO | Source: Ambulatory Visit | Attending: Surgery | Admitting: Surgery

## 2011-07-14 ENCOUNTER — Encounter (HOSPITAL_COMMUNITY): Payer: Self-pay

## 2011-07-14 HISTORY — DX: Sleep apnea, unspecified: G47.30

## 2011-07-14 LAB — COMPREHENSIVE METABOLIC PANEL
ALT: 21 U/L (ref 0–53)
Alkaline Phosphatase: 85 U/L (ref 39–117)
BUN: 13 mg/dL (ref 6–23)
CO2: 27 mEq/L (ref 19–32)
Chloride: 102 mEq/L (ref 96–112)
GFR calc Af Amer: >90 mL/min (ref >90)
Glucose, Bld: 145 mg/dL — ABNORMAL HIGH (ref 70–99)
Potassium: 3.8 mEq/L (ref 3.5–5.1)
Sodium: 137 mEq/L (ref 135–145)
Total Bilirubin: 0.7 mg/dL (ref 0.3–1.2)

## 2011-07-14 LAB — SURGICAL PCR SCREEN
MRSA, PCR: NEGATIVE
Staphylococcus aureus: NEGATIVE

## 2011-07-14 LAB — CBC
HCT: 42.1 % (ref 39.0–52.0)
Hemoglobin: 14.4 g/dL (ref 13.0–17.0)
MCH: 29.9 pg (ref 26.0–34.0)
MCHC: 34.2 g/dL (ref 30.0–36.0)
RDW: 12.4 % (ref 11.5–15.5)

## 2011-07-14 LAB — DIFFERENTIAL
Basophils Relative: 0 % (ref 0–1)
Eosinophils Absolute: 0.2 10*3/uL (ref 0.0–0.7)
Eosinophils Relative: 3 % (ref 0–5)
Lymphs Abs: 2.1 10*3/uL (ref 0.7–4.0)
Monocytes Relative: 5 % (ref 3–12)
Neutrophils Relative %: 62 % (ref 43–77)

## 2011-07-14 NOTE — Patient Instructions (Addendum)
20 Luis [name]  07/14/2011  Your procedure is scheduled on:  07-21-2011  Report to [facility] at 0830  AM.  Call this number if you have problems the morning of surgery: [phone]   Remember:   Do not eat food or drink liquids:After Midnight.  .  Take these medicines the morning of surgery with A SIP OF WATER: VALIUM IF NEEDED, ZANTAC   Do not wear jewelry or make up.  Do not wear lotions, powders, or perfumes.Do not wear deodorant.    Do not bring valuables to the hospital.  Contacts, dentures or bridgework may not be worn into surgery.  Leave suitcase in the car. After surgery it may be brought to your room.  For patients admitted to the hospital, checkout time is 11:00 AM the day of discharge.     Special Instructions: CHG Shower Use Special Wash: 1/2 bottle night before surgery and 1/2 bottle morning of surgery.neck down avoid private area   Please read over the following fact sheets that you were given: MRSA Information  Luis [name] [facility] pre op nurse phone number [phone], call if needed

## 2011-07-14 NOTE — Progress Notes (Signed)
07/14/11 1449  OBSTRUCTIVE SLEEP APNEA  Have you ever been diagnosed with sleep apnea through a sleep study? No  Do you snore loudly (loud enough to be heard through closed doors)?  1  Do you often feel tired, fatigued, or sleepy during the daytime? 1  Has anyone observed you stop breathing during your sleep? 0  Do you have, or are you being treated for high blood pressure? 0  BMI more than 35 kg/m2? 1  Age over 58 years old? 1  Neck circumference greater than 40 cm/18 inches? 0  Gender: 1  Obstructive Sleep Apnea Score 5   Score 4 or greater  Updated health history;Results sent to PCP

## 2011-07-14 NOTE — Pre-Procedure Instructions (Signed)
06-04-2011 chest 2 view xray epic 06-04-2011 ekg epic last office visit note 02-28-2012 dr allred epic Carotid dopplers 12-06-2011 epic

## 2011-07-18 ENCOUNTER — Encounter (INDEPENDENT_AMBULATORY_CARE_PROVIDER_SITE_OTHER): Payer: Self-pay | Admitting: Internal Medicine

## 2011-07-21 ENCOUNTER — Encounter (HOSPITAL_COMMUNITY): Payer: Self-pay | Admitting: Anesthesiology

## 2011-07-21 ENCOUNTER — Inpatient Hospital Stay (HOSPITAL_COMMUNITY)
Admission: RE | Admit: 2011-07-21 | Discharge: 2011-07-22 | DRG: 493 | Disposition: A | Discharge status: Home / Self Care | Payer: BC Managed Care – PPO | Source: Ambulatory Visit | Attending: Surgery | Admitting: Surgery

## 2011-07-21 ENCOUNTER — Encounter (HOSPITAL_COMMUNITY): Payer: Self-pay | Admitting: *Deleted

## 2011-07-21 ENCOUNTER — Ambulatory Visit (HOSPITAL_COMMUNITY): Payer: BC Managed Care – PPO

## 2011-07-21 ENCOUNTER — Ambulatory Visit (HOSPITAL_COMMUNITY): Payer: BC Managed Care – PPO | Admitting: Anesthesiology

## 2011-07-21 ENCOUNTER — Encounter (HOSPITAL_COMMUNITY)
Admission: RE | Disposition: A | Discharge status: Home / Self Care | Payer: Self-pay | Source: Ambulatory Visit | Attending: Surgery

## 2011-07-21 DIAGNOSIS — F411 Generalized anxiety disorder: Secondary | ICD-10-CM | POA: Diagnosis present

## 2011-07-21 DIAGNOSIS — K8010 Calculus of gallbladder with chronic cholecystitis without obstruction: Principal | ICD-10-CM | POA: Diagnosis present

## 2011-07-21 DIAGNOSIS — K829 Disease of gallbladder, unspecified: Secondary | ICD-10-CM

## 2011-07-21 DIAGNOSIS — I359 Nonrheumatic aortic valve disorder, unspecified: Secondary | ICD-10-CM | POA: Diagnosis present

## 2011-07-21 DIAGNOSIS — Z87891 Personal history of nicotine dependence: Secondary | ICD-10-CM

## 2011-07-21 DIAGNOSIS — K811 Chronic cholecystitis: Secondary | ICD-10-CM

## 2011-07-21 DIAGNOSIS — I252 Old myocardial infarction: Secondary | ICD-10-CM

## 2011-07-21 HISTORY — PX: CHOLECYSTECTOMY: SHX55

## 2011-07-21 SURGERY — LAPAROSCOPIC CHOLECYSTECTOMY WITH INTRAOPERATIVE CHOLANGIOGRAM
Anesthesia: General | Site: Abdomen | Wound class: Contaminated

## 2011-07-21 MED ORDER — SUCCINYLCHOLINE CHLORIDE 20 MG/ML IJ SOLN
INTRAMUSCULAR | Status: DC | PRN
  Administered 2011-07-21: 150 mg via INTRAVENOUS

## 2011-07-21 MED ORDER — IOHEXOL 300 MG/ML  SOLN
INTRAMUSCULAR | Status: AC
  Filled 2011-07-21: qty 1

## 2011-07-21 MED ORDER — BUPIVACAINE-EPINEPHRINE PF 0.25-1:200000 % IJ SOLN
INTRAMUSCULAR | Status: AC
  Filled 2011-07-21: qty 30

## 2011-07-21 MED ORDER — DEXAMETHASONE SODIUM PHOSPHATE 4 MG/ML IJ SOLN
INTRAMUSCULAR | Status: DC | PRN
  Administered 2011-07-21: 10 mg via INTRAVENOUS

## 2011-07-21 MED ORDER — PROPOFOL 10 MG/ML IV EMUL
INTRAVENOUS | Status: DC | PRN
  Administered 2011-07-21: 200 mg via INTRAVENOUS

## 2011-07-21 MED ORDER — GLYCOPYRROLATE 0.2 MG/ML IJ SOLN
INTRAMUSCULAR | Status: DC | PRN
  Administered 2011-07-21: 0.3 mg via INTRAVENOUS
  Administered 2011-07-21: .4 mg via INTRAVENOUS

## 2011-07-21 MED ORDER — FENTANYL CITRATE 0.05 MG/ML IJ SOLN: 25.0000 ug | INTRAMUSCULAR | Status: DC | PRN

## 2011-07-21 MED ORDER — LIDOCAINE HCL (CARDIAC) 20 MG/ML IV SOLN
INTRAVENOUS | Status: DC | PRN
  Administered 2011-07-21: 30 mg via INTRAVENOUS

## 2011-07-21 MED ORDER — BUPIVACAINE-EPINEPHRINE PF 0.25-1:200000 % IJ SOLN
INTRAMUSCULAR | Status: DC | PRN
  Administered 2011-07-21: 30 mL

## 2011-07-21 MED ORDER — MIDAZOLAM HCL 5 MG/5ML IJ SOLN
INTRAMUSCULAR | Status: DC | PRN
  Administered 2011-07-21: 0.5 mg via INTRAVENOUS
  Administered 2011-07-21: 1.5 mg via INTRAVENOUS

## 2011-07-21 MED ORDER — LACTATED RINGERS IV SOLN
INTRAVENOUS | Status: DC | PRN
  Administered 2011-07-21 (×2): via INTRAVENOUS

## 2011-07-21 MED ORDER — KCL IN DEXTROSE-NACL 20-5-0.45 MEQ/L-%-% IV SOLN
INTRAVENOUS | Status: DC
  Administered 2011-07-21 (×2): via INTRAVENOUS
  Filled 2011-07-21 (×4): qty 1000

## 2011-07-21 MED ORDER — HYDROCODONE-ACETAMINOPHEN 5-325 MG PO TABS
1.0000 | ORAL_TABLET | ORAL | Status: DC | PRN
  Administered 2011-07-22: 1 via ORAL
  Filled 2011-07-21: qty 2

## 2011-07-21 MED ORDER — CISATRACURIUM BESYLATE 2 MG/ML IV SOLN
INTRAVENOUS | Status: DC | PRN
  Administered 2011-07-21: 10 mg via INTRAVENOUS
  Administered 2011-07-21: 2 mg via INTRAVENOUS

## 2011-07-21 MED ORDER — FAMOTIDINE 10 MG PO TABS
10.0000 mg | ORAL_TABLET | Freq: Two times a day (BID) | ORAL | Status: DC
  Administered 2011-07-21: 10 mg via ORAL
  Filled 2011-07-21 (×4): qty 1

## 2011-07-21 MED ORDER — NITROGLYCERIN 0.4 MG SL SUBL: 0.4000 mg | SUBLINGUAL_TABLET | SUBLINGUAL | Status: DC | PRN

## 2011-07-21 MED ORDER — CEFAZOLIN SODIUM-DEXTROSE 2-3 GM-% IV SOLR
INTRAVENOUS | Status: AC
  Filled 2011-07-21: qty 50

## 2011-07-21 MED ORDER — LACTATED RINGERS IR SOLN
Status: DC | PRN
  Administered 2011-07-21: 1000 mL

## 2011-07-21 MED ORDER — ACETAMINOPHEN 10 MG/ML IV SOLN
INTRAVENOUS | Status: DC | PRN
  Administered 2011-07-21: 1000 mg via INTRAVENOUS

## 2011-07-21 MED ORDER — DIAZEPAM 5 MG PO TABS: 5.0000 mg | ORAL_TABLET | Freq: Four times a day (QID) | ORAL | Status: DC | PRN

## 2011-07-21 MED ORDER — FENTANYL CITRATE 0.05 MG/ML IJ SOLN
INTRAMUSCULAR | Status: DC | PRN
  Administered 2011-07-21 (×2): 50 ug via INTRAVENOUS

## 2011-07-21 MED ORDER — LACTATED RINGERS IV SOLN: INTRAVENOUS | Status: DC

## 2011-07-21 MED ORDER — ACETAMINOPHEN 10 MG/ML IV SOLN
INTRAVENOUS | Status: AC
  Filled 2011-07-21: qty 100

## 2011-07-21 MED ORDER — LACTATED RINGERS IV SOLN
INTRAVENOUS | Status: DC
  Administered 2011-07-21: 1000 mL via INTRAVENOUS

## 2011-07-21 MED ORDER — ATROPINE SULFATE 0.4 MG/ML IJ SOLN
INTRAMUSCULAR | Status: DC | PRN
  Administered 2011-07-21: 0.2 mg via INTRAVENOUS

## 2011-07-21 MED ORDER — KETAMINE HCL 10 MG/ML IJ SOLN
INTRAMUSCULAR | Status: DC | PRN
  Administered 2011-07-21 (×6): 5 mg via INTRAVENOUS

## 2011-07-21 MED ORDER — CEFAZOLIN SODIUM-DEXTROSE 2-3 GM-% IV SOLR
2.0000 g | Freq: Once | INTRAVENOUS | Status: AC
  Administered 2011-07-21: 2 g via INTRAVENOUS

## 2011-07-21 MED ORDER — DROPERIDOL 2.5 MG/ML IJ SOLN
INTRAMUSCULAR | Status: DC | PRN
  Administered 2011-07-21: .5 mg via INTRAVENOUS

## 2011-07-21 MED ORDER — MORPHINE SULFATE 2 MG/ML IJ SOLN: 1.0000 mg | INTRAMUSCULAR | Status: DC | PRN

## 2011-07-21 MED ORDER — MIDAZOLAM HCL 10 MG/2ML IJ SOLN
INTRAMUSCULAR | Status: AC
  Filled 2011-07-21: qty 2

## 2011-07-21 MED ORDER — NEOSTIGMINE METHYLSULFATE 1 MG/ML IJ SOLN
INTRAMUSCULAR | Status: DC | PRN
  Administered 2011-07-21: 3 mg via INTRAVENOUS

## 2011-07-21 MED ORDER — HEPARIN SODIUM (PORCINE) 5000 UNIT/ML IJ SOLN
5000.0000 [IU] | Freq: Three times a day (TID) | INTRAMUSCULAR | Status: DC
  Administered 2011-07-21 – 2011-07-22 (×2): 5000 [IU] via SUBCUTANEOUS
  Filled 2011-07-21 (×5): qty 1

## 2011-07-21 MED ORDER — FENTANYL CITRATE 0.05 MG/ML IJ SOLN
INTRAMUSCULAR | Status: AC
  Filled 2011-07-21: qty 2

## 2011-07-21 MED ORDER — ONDANSETRON HCL 4 MG/2ML IJ SOLN
INTRAMUSCULAR | Status: DC | PRN
  Administered 2011-07-21 (×2): 2 mg via INTRAVENOUS

## 2011-07-21 MED ORDER — ONDANSETRON HCL 4 MG/2ML IJ SOLN: 4.0000 mg | Freq: Four times a day (QID) | INTRAMUSCULAR | Status: DC | PRN

## 2011-07-21 MED ORDER — HYDROMORPHONE HCL PF 1 MG/ML IJ SOLN
INTRAMUSCULAR | Status: DC | PRN
  Administered 2011-07-21: 1 mg via INTRAVENOUS

## 2011-07-21 MED ORDER — SODIUM CHLORIDE 0.9 % IJ SOLN
INTRAMUSCULAR | Status: DC | PRN
  Administered 2011-07-21: 13:00:00

## 2011-07-21 MED ORDER — ONDANSETRON HCL 4 MG PO TABS: 4.0000 mg | ORAL_TABLET | Freq: Four times a day (QID) | ORAL | Status: DC | PRN

## 2011-07-21 SURGICAL SUPPLY — 40 items
APPLIER CLIP ROT 10 11.4 M/L (STAPLE) ×2
BENZOIN TINCTURE PRP APPL 2/3 (GAUZE/BANDAGES/DRESSINGS) ×2 IMPLANT
CANISTER SUCTION 2500CC (MISCELLANEOUS) ×2 IMPLANT
CHLORAPREP W/TINT 26ML (MISCELLANEOUS) ×2 IMPLANT
CHOLANGIOGRAM CATH TAUT (CATHETERS) ×2 IMPLANT
CLIP APPLIE ROT 10 11.4 M/L (STAPLE) ×1 IMPLANT
CLOTH BEACON ORANGE TIMEOUT ST (SAFETY) ×2 IMPLANT
COVER MAYO STAND STRL (DRAPES) IMPLANT
DECANTER SPIKE VIAL GLASS SM (MISCELLANEOUS) ×2 IMPLANT
DERMABOND ADVANCED (GAUZE/BANDAGES/DRESSINGS) ×1
DERMABOND ADVANCED .7 DNX12 (GAUZE/BANDAGES/DRESSINGS) ×1 IMPLANT
DRAPE C-ARM 42X72 X-RAY (DRAPES) IMPLANT
DRAPE LAPAROSCOPIC ABDOMINAL (DRAPES) ×2 IMPLANT
ELECT REM PT RETURN 9FT ADLT (ELECTROSURGICAL) ×2
ELECTRODE REM PT RTRN 9FT ADLT (ELECTROSURGICAL) ×1 IMPLANT
GLOVE BIOGEL PI IND STRL 7.0 (GLOVE) ×1 IMPLANT
GLOVE BIOGEL PI INDICATOR 7.0 (GLOVE) ×1
GLOVE SURG SIGNA 7.5 PF LTX (GLOVE) ×2 IMPLANT
GOWN STRL NON-REIN LRG LVL3 (GOWN DISPOSABLE) ×6 IMPLANT
GOWN STRL REIN XL XLG (GOWN DISPOSABLE) ×4 IMPLANT
HEMOSTAT SURGICEL 4X8 (HEMOSTASIS) IMPLANT
IV CATH 14GX2 1/4 (CATHETERS) ×2 IMPLANT
IV SET EXT 30 76VOL 4 MALE LL (IV SETS) ×2 IMPLANT
KIT BASIN OR (CUSTOM PROCEDURE TRAY) ×2 IMPLANT
NS IRRIG 1000ML POUR BTL (IV SOLUTION) IMPLANT
POUCH SPECIMEN RETRIEVAL 10MM (ENDOMECHANICALS) IMPLANT
SET IRRIG TUBING LAPAROSCOPIC (IRRIGATION / IRRIGATOR) ×2 IMPLANT
SLEEVE Z-THREAD 5X100MM (TROCAR) IMPLANT
SOLUTION ANTI FOG 6CC (MISCELLANEOUS) ×2 IMPLANT
STOPCOCK K 69 2C6206 (IV SETS) ×2 IMPLANT
STRIP CLOSURE SKIN 1/4X4 (GAUZE/BANDAGES/DRESSINGS) IMPLANT
SUT VIC AB 5-0 PS2 18 (SUTURE) ×2 IMPLANT
TOWEL OR 17X26 10 PK STRL BLUE (TOWEL DISPOSABLE) ×6 IMPLANT
TRAY LAP CHOLE (CUSTOM PROCEDURE TRAY) ×2 IMPLANT
TROCAR XCEL BLUNT TIP 100MML (ENDOMECHANICALS) ×2 IMPLANT
TROCAR Z-THREAD FIOS 11X100 BL (TROCAR) ×2 IMPLANT
TROCAR Z-THREAD FIOS 5X100MM (TROCAR) ×2 IMPLANT
TROCAR Z-THREAD SLEEVE 11X100 (TROCAR) ×2 IMPLANT
TUBING INSUFFLATION 10FT LAP (TUBING) ×2 IMPLANT
WATER STERILE IRR 1500ML POUR (IV SOLUTION) ×2 IMPLANT

## 2011-07-21 NOTE — H&P (Signed)
Re: Luis [name]  DOB: [birth year]  MRN: [id number]   ASSESSMENT AND PLAN:  1. Gall bladder disease.   I discussed with the patient the indications and risks of gall bladder surgery. The primary risks of gall bladder surgery include, but are not limited to, bleeding, infection, common bile duct injury, and open surgery. There is also the risk that the patient may have continued symptoms after surgery. However, the likelihood of improvement in symptoms and return to the patient's normal status is good. We discussed the typical post-operative recovery course. I tried to answer the patient's questions.   I gave the patient literature about gall bladder surgery.   The patient had more abdominal symptoms last night.  His wife is here with him today.  2. Aortic stenosis, mild.  3. History of MI (unknown date), PCI of RCA in Aug 2012 by Dr. Excell Seltzer.   Sees Dr. Jamesetta Orleans. Last seen Dec 2012.   Cleared by Dr. Shela Commons. Allred 06/16/2011. DN 06/29/2011 4. Anxiety.  5. History of hemorrhoids - seen by Dr. Cordelia Poche in the past.  6. Quit smoking 01/19/2011.   Chief Complaint   Patient presents with   .  Pre-op Exam     eval gallbladder with stones   REFERRING PHYSICIAN: Nelwyn Salisbury, MD, MD   HISTORY OF PRESENT ILLNESS:  Luis [name] is a 56 y.o. (DOB: May 19, 1955) white male whose primary care physician is Nelwyn Salisbury, MD, MD and comes to me today for gall bladder disease.  The patient has had no significant abdominal symptoms prior to early February of this year. However around 05 June 2011, he developed severe epigastric abdominal pain. He went to the [facility] emergency room and was evaluated.  His evaluation included ultrasound of his abdomen which showed small stones and sludge in his gallbladder. His liver functions were normal. His lipase was normal. His symptoms have resolved. He's had minimal symptoms since that time. He comes today for discussion of further management of his  gallbladder disease.  No history of stomach disease. No history of liver disease. No history of pancreas disease. No history of colon disease. Had colonoscopy by Dr. Nyra Capes around 2011.   Past Medical History   Diagnosis  Date   .  Aortic stenosis, mild  03-24-04     per cath 8/12   .  ED (erectile dysfunction)    .  Anxiety    .  Carotid stenosis      11/09 dopplers with 0-39% bilat. ICA   .  History of colonic polyps      hyperplastic   .  Rectal bleeding    .  CAD (coronary artery disease)      s/p PCI of the RCA 8/12 with DES by Dr Excell Seltzer, preserved EF   .  External hemorrhoids    .  Tobacco use disorder      quit since 9/12   .  Heart murmur    .  Heart attack    .  Bruises easily     Past Surgical History   Procedure  Date   .  Tonsillectomy    .  Cataract extraction    .  Right elbow surgery    .  Solonscopy  05/23/08     per Dr. Celene Kras hemorrhoids only, repeat in 5 years   .  Neck surgery  03/23/09     per Dr. Ophelia Charter, cervical fusion   .  Coronary stenting  s/p PCI of the RCA by Dr Excell Seltzer 8/12 with 2 promus stents    Current Outpatient Prescriptions   Medication  Sig  Dispense  Refill   .  aspirin 81 MG tablet  Take 81 mg by mouth daily.     .  clopidogrel (PLAVIX) 75 MG tablet  Take 75 mg by mouth daily.     .  diazepam (VALIUM) 5 MG tablet  Take 5 mg by mouth every 6 (six) hours as needed. For anxiety.     .  nitroGLYCERIN (NITROSTAT) 0.4 MG SL tablet  Place 0.4 mg under the tongue every 5 (five) minutes as needed. For chest pain.     .  rosuvastatin (CRESTOR) 40 MG tablet  Take 40 mg by mouth daily.     No Known Allergies   REVIEW OF SYSTEMS:  Skin: No history of rash. No history of abnormal moles.  Infection: No history of hepatitis or HIV. No history of MRSA.  Neurologic: No history of stroke. No history of seizure. No history of headaches.  Cardiac: Significant CAD. Needed 2 stents in Aug 2012. Sees Dr. Jamesetta Orleans. Has aortic stenosis.   Pulmonary: Quit smoking Sept. 2012. Chantex helped him quit smoking.  Endocrine: No diabetes. No thyroid disease.  Gastrointestinal: See HPI.  Urologic: No history of kidney stones. No history of bladder infections.  Musculoskeletal: No history of joint or back disease.  Hematologic: No bleeding disorder. No history of anemia. Not anticoagulated.  Psycho-social: The patient is oriented. The patient has no obvious psychologic or social impairment to understanding our conversation and plan.   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  Wife, Inocencio Homes, with patient.   PHYSICAL EXAM:  Ht 5' 6.5" (1.689 m)  Wt 219 lb 9.6 oz (99.61 kg)  BMI 34.91 kg/m2   General: Bearded WN WM who is alert.  HEENT: Normal. Pupils equal. Upper dentures.  Neck: Supple. No mass. No thyroid mass.  Lymph Nodes: No supraclavicular or cervical nodes.  Lungs: Clear to auscultation and symmetric breath sounds.  Heart: RRR. 3/6 systolic precordial murmur.   Abdomen: Soft. No mass. No tenderness. No hernia. Normal bowel sounds. No abdominal scars.  Rectal: Not done.  Extremities: Good strength and ROM in upper and lower extremities.  Neurologic: Grossly intact to motor and sensory function.  Psychiatric: Has normal mood and affect. Behavior is normal.   DATA REVIEWED:  ER visit data, [place] report.   Ovidio Kin, MD, [facility]  [address] [place]  [place], [place] [address]  Phone: [phone] FAX: [phone]

## 2011-07-21 NOTE — Brief Op Note (Signed)
07/21/2011  12:43 PM  PATIENT:  Luis [name], 56 y.o., male, MRN: [id number]  PREOP DIAGNOSIS:  GALL BLADDER DISEASE   POSTOP DIAGNOSIS:   Chronic cholecystitis, cholelithiasis.  PROCEDURE:   Procedure(s):  LAPAROSCOPIC CHOLECYSTECTOMY WITH INTRAOPERATIVE CHOLANGIOGRAM  SURGEON:   Ovidio Kin, M.D.  ASSISTANTMichaell Cowing, MD  ANESTHESIA:   general  Gaetano Hawthorne, MD - Anesthesiologist  General  EBL:  minimal  ml  BLOOD ADMINISTERED: none  DRAINS: none   LOCAL MEDICATIONS USED:   25 cc 1/4% marcaine  SPECIMEN:   Gall bladder  COUNTS CORRECT:  YES  INDICATIONS FOR PROCEDURE:  Luis [name] is a 56 y.o. (DOB: 12-06-1955) white male whose primary care physician is Nelwyn Salisbury, MD, MD and comes for gall bladder surgery.   The indications and risks of the surgery were explained to the patient.  The risks include, but are not limited to, infection, bleeding, and nerve injury.  Note dictated to:   [phone]

## 2011-07-21 NOTE — Anesthesia Postprocedure Evaluation (Signed)
  Anesthesia Post-op Note  Patient: Luis [name]  Procedure(s) Performed: Procedure(s) (LRB): LAPAROSCOPIC CHOLECYSTECTOMY WITH INTRAOPERATIVE CHOLANGIOGRAM (N/A)  Patient Location: PACU  Anesthesia Type: General  Level of Consciousness: awake and alert   Airway and Oxygen Therapy: Patient Spontanous Breathing  Post-op Pain: mild  Post-op Assessment: Post-op Vital signs reviewed, Patient's Cardiovascular Status Stable, Respiratory Function Stable, Patent Airway and No signs of Nausea or vomiting  Post-op Vital Signs: stable  Complications: No apparent anesthesia complications

## 2011-07-21 NOTE — Anesthesia Preprocedure Evaluation (Addendum)
Anesthesia Evaluation  Patient identified by MRN, date of birth, ID band Patient awake    Reviewed: Allergy & Precautions, H&P , NPO status , Patient's Chart, lab work & pertinent test results  Airway Mallampati: II TM Distance: >3 FB Neck ROM: full    Dental  (+) Dental Advisory Given and Missing,    Pulmonary sleep apnea , Current Smoker,  Stop bang 5.  No CPAP or history of OSA breath sounds clear to auscultation  Pulmonary exam normal       Cardiovascular + CAD and + Past MI + Valvular Problems/Murmurs AS Rhythm:regular Rate:Normal  PCI of the RCA 8/12. No angina or probs since.  Preserved EF.  Mild AS.     Neuro/Psych Anxiety negative neurological ROS  negative psych ROS   GI/Hepatic negative GI ROS, Neg liver ROS,   Endo/Other  negative endocrine ROS  Renal/GU negative Renal ROS  negative genitourinary   Musculoskeletal   Abdominal   Peds  Hematology negative hematology ROS (+)   Anesthesia Other Findings   Reproductive/Obstetrics negative OB ROS                          Anesthesia Physical Anesthesia Plan  ASA: III  Anesthesia Plan: General   Post-op Pain Management:    Induction:   Airway Management Planned: Oral ETT  Additional Equipment:   Intra-op Plan:   Post-operative Plan: Extubation in OR  Informed Consent: I have reviewed the patients History and Physical, chart, labs and discussed the procedure including the risks, benefits and alternatives for the proposed anesthesia with the patient or authorized representative who has indicated his/her understanding and acceptance.   Dental Advisory Given  Plan Discussed with: CRNA and Surgeon  Anesthesia Plan Comments:        Anesthesia Quick Evaluation

## 2011-07-21 NOTE — Transfer of Care (Signed)
Immediate Anesthesia Transfer of Care Note  Patient: Luis [name]  Procedure(s) Performed: Procedure(s) (LRB): LAPAROSCOPIC CHOLECYSTECTOMY WITH INTRAOPERATIVE CHOLANGIOGRAM (N/A)  Patient Location: PACU  Anesthesia Type: General  Level of Consciousness: awake and sedated  Airway & Oxygen Therapy: Patient Spontanous Breathing and Patient connected to face mask  Post-op Assessment: Report given to PACU RN and Post -op Vital signs reviewed and stable  Post vital signs: Reviewed and stable  Complications: No apparent anesthesia complications

## 2011-07-22 ENCOUNTER — Encounter (HOSPITAL_COMMUNITY): Payer: Self-pay | Admitting: Surgery

## 2011-07-22 MED ORDER — HYDROCODONE-ACETAMINOPHEN 5-325 MG PO TABS: 1.0000 | ORAL_TABLET | ORAL | Status: AC | PRN

## 2011-07-22 NOTE — Progress Notes (Signed)
Patient provided with discharge instructions and prescriptions. Patient verbalized understanding. Patient discharged to home. 

## 2011-07-22 NOTE — Discharge Instructions (Signed)

## 2011-07-22 NOTE — Discharge Summary (Signed)
  Physician Discharge Summary  Patient ID: Luis [name] MRN: [id number] DOB/AGE: July 16, 1955 57 y.o.  Admit date: 07/21/2011 Discharge date: 07/22/2011  Admission Diagnoses:  Chronic cholecystitis  Discharge Diagnoses:  same  Active Problems:  * No active hospital problems. *    Surgery:  Laparoscopic cholecystectomy with IOC  Discharged Condition: improved  Hospital Course:   Had surgery and observed overnight.  Eating a hearty breakfast and ready for discharge on PD 1.  Consults: none  Significant Diagnostic Studies: IOC    Discharge Exam: Blood pressure 132/73, pulse 65, temperature 97.8 F (36.6 C), temperature source Oral, resp. rate 16, height 5\' 6"  (1.676 m), weight 219 lb 12.8 oz (99.701 kg), SpO2 99.00%. incisons ok with dermabond in place.    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      clopidogrel 75 MG tablet   Commonly known as: PLAVIX      diazepam 5 MG tablet   Commonly known as: VALIUM   Take 5 mg by mouth every 6 (six) hours as needed. For anxiety.      HYDROcodone-acetaminophen 5-325 MG per tablet   Commonly known as: NORCO   Take 1-2 tablets by mouth every 4 (four) hours as needed.      nitroGLYCERIN 0.4 MG SL tablet   Commonly known as: NITROSTAT   Place 0.4 mg under the tongue every 5 (five) minutes as needed. For chest pain.      ranitidine 150 MG capsule   Commonly known as: ZANTAC   Take 150 mg by mouth 2 (two) times daily.      rosuvastatin 40 MG tablet   Commonly known as: CRESTOR   Take 40 mg by mouth every evening.           Follow-up  Information    Follow up with NEWMAN,DAVID H, MD in 4 weeks.   Contact information:   [employer] [address], [address] [place] [address] [phone]          Signed: Valarie Merino 07/22/2011, 7:51 AM

## 2011-07-22 NOTE — Op Note (Signed)
NAMEMarland Kitchen  Luis, [name] NO.:  [id number]  MEDICAL RECORD NO.:  [id number]  LOCATION:  1536                         FACILITY:  [facility]  PHYSICIAN:  Sandria Bales. Ezzard Standing, M.D.  DATE OF BIRTH:  07-25-1955  DATE OF PROCEDURE:  07/21/2011                              OPERATIVE REPORT  PREOPERATIVE DIAGNOSIS:  Chronic cholecystitis and cholelithiasis.  POSTOPERATIVE DIAGNOSIS:  Chronic cholecystitis and cholelithiasis.  PROCEDURE:  Laparoscopic cholecystectomy with intraoperative cholangiogram.  SURGEON:  Sandria Bales. Ezzard Standing, M.D.  FIRST ASSISTANT:  Ardeth Sportsman, MD  ANESTHESIA:  General endotracheal.  BLOOD LOSS:  Minimal.  INDICATION FOR PROCEDURE:  [name] is a 56 year old white male who sees Dr. Gershon Crane as his primary medical doctor.  He developed acute abdominal pain, presented to the [facility] Emergency Room in February and was found to have gallstones.  His other evaluation was negative.  The symptoms were probably due to gallbladder disease.  I discussed with him about the indications, potential complications of gallbladder surgery.  Potential complications included, but are not limited to, bleeding, infection, common bile duct injury, and open surgery.  OPERATIVE NOTE:  The patient was taken to room #11 where he underwent a general endotracheal anesthetic and supervised by Dr. Ronelle Nigh.  His abdomen was shaved and prepped with ChloraPrep and sterilely draped.  A time-out was held surgical checklist run.  I accessed the abdominal cavity through an infraumbilical incision with sharp dissection carried down to the abdominal cavity.  I placed 3 additional trocars, a 10 mm subxiphoid trocar, 5 mm right mid subcostal, 5 mm lateral subcostal trocar.  I did an evaluation of the abdomen.  The stomach was unremarkable.  Both lobes of liver unremarkable.  The bile duct I could see was unremarkable, though he had a fair amount of omentum consistent  with his body habitus.    I then found the gallbladder, grasped, rotated cephalad.  Three-fourth of it was covered with fat and some extent appeared to be chronically inflamed.  I stripped down the fat to identify the cystic artery, which was doubly clipped and divided.   Identified the cystic duct.  I then shot an intraoperative cholangiogram.  Intraoperative cholangiogram was shot using a cutoff taut catheter, inserted through a 14-gauge Jelco catheter and into the side of the cut cystic duct.  The taut catheter was secured with an Endoclip.  I used about 15 mL of half-strength Hypaque solution and injected this under fluoro showing free flow of contrast down the cystic duct into the common bile duct up the hepatic radicals and into the duodenum.  There was no obstruction or filling defect.  This looked to be a normal intraoperative cholangiogram.  The taut catheter was then removed.  Cystic duct triply clipped and divided.  The gallbladder was then bluntly and sharply dissected from the gallbladder bed, and placed in an EndoCatch bag. I revisualized the triangle of Calot and gallbladder bed.  There was no bleeding or bile leak.  I irrigated with 2 L of saline.  The gallbladder was then delivered through the umbilicus.  The umbilical port was closed with 0 Vicryl suture.  I washed the ports.  The trocar was removed. There was no bleeding at trocar site.  The skin at each site was closed with 5-0 Vicryl suture, painted with Dermabond.    The patient tolerated the procedure well, was transported to recovery room in good condition. Sponge and needle count were correct.   Sandria Bales. Ezzard Standing, M.D., FACS   DHN/MEDQ  D:  07/21/2011  T:  07/22/2011  Job:  [id number]  cc:   Jeannett Senior A. Clent Ridges, MD [address] [place] [address]  Hillis Range, MD [address] [place] [address]  Vania Rea. Jarold Motto, MD, FACG, FACP, FAGA [address] [place] [address]

## 2011-07-28 ENCOUNTER — Telehealth (INDEPENDENT_AMBULATORY_CARE_PROVIDER_SITE_OTHER): Payer: Self-pay

## 2011-07-28 NOTE — Telephone Encounter (Signed)
I left patient several messages to call me for his postop appointment.  It is scheduled for 08/18/11 4pm

## 2011-07-29 ENCOUNTER — Encounter (INDEPENDENT_AMBULATORY_CARE_PROVIDER_SITE_OTHER): Payer: Self-pay

## 2011-08-11 ENCOUNTER — Telehealth: Payer: Self-pay | Admitting: Family Medicine

## 2011-08-11 NOTE — Telephone Encounter (Signed)
Refill request for Diazepam 5 mg take 1 po q6hrs prn and pt last here on 12/08/10.

## 2011-08-12 NOTE — Telephone Encounter (Signed)
Call in #[phone] with 5 rf 

## 2011-08-15 MED ORDER — DIAZEPAM 5 MG PO TABS: 5.0000 mg | ORAL_TABLET | Freq: Four times a day (QID) | ORAL | Status: DC | PRN

## 2011-08-15 NOTE — Telephone Encounter (Signed)
Script called in

## 2011-08-18 ENCOUNTER — Encounter (INDEPENDENT_AMBULATORY_CARE_PROVIDER_SITE_OTHER): Payer: BC Managed Care – PPO | Admitting: Surgery

## 2011-09-13 ENCOUNTER — Ambulatory Visit (INDEPENDENT_AMBULATORY_CARE_PROVIDER_SITE_OTHER): Payer: BC Managed Care – PPO | Admitting: Family Medicine

## 2011-09-13 ENCOUNTER — Encounter: Payer: Self-pay | Admitting: Family Medicine

## 2011-09-13 VITALS — BP 130/78 | HR 59 | Temp 97.9°F | Wt 222.0 lb

## 2011-09-13 DIAGNOSIS — F341 Dysthymic disorder: Secondary | ICD-10-CM

## 2011-09-13 DIAGNOSIS — F418 Other specified anxiety disorders: Secondary | ICD-10-CM

## 2011-09-13 MED ORDER — ESCITALOPRAM OXALATE 10 MG PO TABS: 10.0000 mg | ORAL_TABLET | Freq: Every day | ORAL | Status: DC

## 2011-09-13 NOTE — Progress Notes (Signed)
  Subjective:    Patient ID: Luis [name], male    DOB: 1955-07-22, 56 y.o.   MRN: [id number]  HPI Here with his wife for increasing problems of anxiety and what they think are "panic attacks". He has been under stress lately due to some legal trouble his son is dealing with, and [name] worries about him all the time. He feels pressure in his chest and SOB, especially when he is in a crowded room. Frequently he has to go outside to relieve the overwhelming feelings of dread that come over him. This is happening at work and in other places. He has trouble sleeping but his appetite is normal. He admits to feeling sad and hopeless at times as well, and he often has uncontrollable crying spells. He denies any suicidal thoughts. These feelings have troubled him for about 6 months but are getting worse. He has used Valium on a prn basis for 10 years or more.    Review of Systems  Constitutional: Negative.   Respiratory: Positive for chest tightness and shortness of breath. Negative for cough, choking and wheezing.   Cardiovascular: Negative.   Psychiatric/Behavioral: Positive for hallucinations, sleep disturbance, dysphoric mood, decreased concentration and agitation. Negative for suicidal ideas, confusion and self-injury. The patient is nervous/anxious. The patient is not hyperactive.        Objective:   Physical Exam  Constitutional: He appears well-developed and well-nourished.  Neck: No thyromegaly present.  Cardiovascular: Normal rate, regular rhythm, normal heart sounds and intact distal pulses.   Pulmonary/Chest: Effort normal and breath sounds normal.  Lymphadenopathy:    He has no cervical adenopathy.  Psychiatric: His behavior is normal. Judgment and thought content normal.       Tearful, poor eye contact           Assessment & Plan:  He has depression mixed with anxiety. Continue Valium prn, but we will start on Lexapro 10 mg daily. Recheck in 2 weeks. We will probably need to  titrate up on the dose.

## 2011-09-27 ENCOUNTER — Encounter: Payer: Self-pay | Admitting: Family Medicine

## 2011-09-27 ENCOUNTER — Ambulatory Visit (INDEPENDENT_AMBULATORY_CARE_PROVIDER_SITE_OTHER): Payer: BC Managed Care – PPO | Admitting: Family Medicine

## 2011-09-27 VITALS — BP 126/88 | HR 62 | Temp 98.4°F | Wt 218.0 lb

## 2011-09-27 DIAGNOSIS — F418 Other specified anxiety disorders: Secondary | ICD-10-CM

## 2011-09-27 DIAGNOSIS — F341 Dysthymic disorder: Secondary | ICD-10-CM

## 2011-09-27 NOTE — Progress Notes (Signed)
  Subjective:    Patient ID: Luis [name], male    DOB: 1955/11/09, 56 y.o.   MRN: [id number]  HPI Here to follow up on anxiety and depression. We saw him 2 weeks ago, and he discussed some of the stressors in his life. We started him on Lexapro to take daily in addition to prn Valium, and this has been very successful. He feels better, he is less anxious, and his moods are improved. He wants to continue the meds.    Review of Systems  Constitutional: Negative.   Psychiatric/Behavioral: Positive for dysphoric mood. The patient is nervous/anxious.        Objective:   Physical Exam  Constitutional: He appears well-developed and well-nourished.  Psychiatric: He has a normal mood and affect. His behavior is normal. Thought content normal.       He seems much more relaxed and is smiling          Assessment & Plan:  He is much better. We will stay on current meds, and will recheck in 2 months

## 2011-12-10 ENCOUNTER — Other Ambulatory Visit: Payer: Self-pay | Admitting: Physician Assistant

## 2011-12-10 ENCOUNTER — Other Ambulatory Visit: Payer: Self-pay | Admitting: Family Medicine

## 2011-12-12 ENCOUNTER — Other Ambulatory Visit: Payer: Self-pay | Admitting: *Deleted

## 2011-12-12 ENCOUNTER — Telehealth: Payer: Self-pay | Admitting: Family Medicine

## 2011-12-12 ENCOUNTER — Encounter: Payer: Self-pay | Admitting: *Deleted

## 2011-12-12 NOTE — Telephone Encounter (Signed)
Refill request for Escitalopram 10 mg take 1 po qd and last here on 09/27/11.

## 2011-12-14 MED ORDER — ESCITALOPRAM OXALATE 10 MG PO TABS: 10.0000 mg | ORAL_TABLET | Freq: Every day | ORAL | Status: DC

## 2011-12-14 NOTE — Addendum Note (Signed)
Addended by: Aniceto Boss A on: 12/14/2011 04:18 PM   Modules accepted: Orders

## 2011-12-14 NOTE — Telephone Encounter (Signed)
Call in one year supply  

## 2011-12-14 NOTE — Telephone Encounter (Signed)
I sent script e-scribe. 

## 2011-12-28 ENCOUNTER — Encounter: Payer: Self-pay | Admitting: Family Medicine

## 2011-12-28 ENCOUNTER — Ambulatory Visit (INDEPENDENT_AMBULATORY_CARE_PROVIDER_SITE_OTHER): Payer: BC Managed Care – PPO | Admitting: Family Medicine

## 2011-12-28 VITALS — BP 122/78 | HR 68 | Temp 97.9°F | Wt 215.0 lb

## 2011-12-28 DIAGNOSIS — I350 Nonrheumatic aortic (valve) stenosis: Secondary | ICD-10-CM

## 2011-12-28 DIAGNOSIS — I2510 Atherosclerotic heart disease of native coronary artery without angina pectoris: Secondary | ICD-10-CM

## 2011-12-28 DIAGNOSIS — F411 Generalized anxiety disorder: Secondary | ICD-10-CM

## 2011-12-28 DIAGNOSIS — I359 Nonrheumatic aortic valve disorder, unspecified: Secondary | ICD-10-CM

## 2011-12-28 MED ORDER — ESCITALOPRAM OXALATE 20 MG PO TABS: 20.0000 mg | ORAL_TABLET | Freq: Every day | ORAL | Status: DC

## 2011-12-28 NOTE — Progress Notes (Signed)
  Subjective:    Patient ID: Luis [name], male    DOB: 09/02/1955, 56 y.o.   MRN: [id number]  HPI Here to follow up on anxiety. Last visit we started on Lexapro and prn Valium, and this has been quite helpful for him. He feels more relaxed, he sleeps better, his BP is better controlled, and he can focus on his job better. He would like to try a higher dose however. No side effects to report.    Review of Systems  Constitutional: Negative.   Respiratory: Negative.   Cardiovascular: Negative.   Neurological: Negative.   Psychiatric/Behavioral: Negative for hallucinations, behavioral problems, confusion, dysphoric mood, decreased concentration and agitation. The patient is nervous/anxious.        Objective:   Physical Exam  Constitutional: He appears well-developed and well-nourished.  Cardiovascular: Normal rate, regular rhythm, normal heart sounds and intact distal pulses.   Pulmonary/Chest: Effort normal and breath sounds normal.  Lymphadenopathy:    He has no cervical adenopathy.  Neurological: He is alert.  Psychiatric: He has a normal mood and affect. His behavior is normal. Thought content normal.          Assessment & Plan:  We will increase Lexapro to 20 mg a day. Recheck prn

## 2011-12-29 ENCOUNTER — Encounter: Payer: Self-pay | Admitting: Family Medicine

## 2012-01-09 ENCOUNTER — Ambulatory Visit (INDEPENDENT_AMBULATORY_CARE_PROVIDER_SITE_OTHER): Payer: BC Managed Care – PPO | Admitting: Internal Medicine

## 2012-01-09 ENCOUNTER — Encounter: Payer: Self-pay | Admitting: Internal Medicine

## 2012-01-09 VITALS — BP 120/68 | HR 59 | Ht 66.0 in | Wt 213.0 lb

## 2012-01-09 DIAGNOSIS — R0989 Other specified symptoms and signs involving the circulatory and respiratory systems: Secondary | ICD-10-CM

## 2012-01-09 DIAGNOSIS — F17200 Nicotine dependence, unspecified, uncomplicated: Secondary | ICD-10-CM

## 2012-01-09 DIAGNOSIS — I359 Nonrheumatic aortic valve disorder, unspecified: Secondary | ICD-10-CM

## 2012-01-09 DIAGNOSIS — R0683 Snoring: Secondary | ICD-10-CM

## 2012-01-09 DIAGNOSIS — R5381 Other malaise: Secondary | ICD-10-CM

## 2012-01-09 DIAGNOSIS — R5383 Other fatigue: Secondary | ICD-10-CM | POA: Insufficient documentation

## 2012-01-09 DIAGNOSIS — I350 Nonrheumatic aortic (valve) stenosis: Secondary | ICD-10-CM

## 2012-01-09 DIAGNOSIS — Z720 Tobacco use: Secondary | ICD-10-CM

## 2012-01-09 DIAGNOSIS — E785 Hyperlipidemia, unspecified: Secondary | ICD-10-CM

## 2012-01-09 DIAGNOSIS — I2510 Atherosclerotic heart disease of native coronary artery without angina pectoris: Secondary | ICD-10-CM

## 2012-01-09 NOTE — Progress Notes (Signed)
PCP: Nelwyn Salisbury, MD  Luis [name] is a 56 y.o. male who presents today for routine cardiology followup.  Since last being seen in our clinic, the patient reports doing very well.  He has quit smoking for a year now.  He remains active, without ischemic symptoms.  His concern today is with fatigue.  He reports that he snores and does not feel well rested in the am.  He is frequently tired throughout the day.   Today, he denies symptoms of palpitations, chest pain, shortness of breath,  lower extremity edema, dizziness, presyncope, or syncope.  The patient is otherwise without complaint today.   Past Medical History  Diagnosis Date  . Aortic stenosis, mild 03-24-04    per cath 8/12  . Anxiety   . Carotid stenosis     11/09 dopplers with 0-39% bilat. ICA  . History of colonic polyps     hyperplastic  . Rectal bleeding   . CAD (coronary artery disease)     s/p PCI of the RCA 8/12 with DES by Dr Excell Seltzer, preserved EF  . External hemorrhoids   . Tobacco use disorder     quit since 9/12  . Heart murmur   . Heart attack   . ED (erectile dysfunction)   . Sleep apnea     STOPBANG=5   Past Surgical History  Procedure Date  . Tonsillectomy   . Cataract extraction  4 YRS AGO    BOTH EYES  . Right elbow surgery   . Solonscopy 05/23/08    per Dr. Celene Kras hemorrhoids only, repeat in 5 years  . Neck surgery 03/23/09    per Dr. Ophelia Charter, cervical fusion   . Coronary stenting     s/p PCI of the RCA by Dr Excell Seltzer 8/12 with 2 promus stents  . Cholecystectomy 07/21/2011    Procedure: LAPAROSCOPIC CHOLECYSTECTOMY WITH INTRAOPERATIVE CHOLANGIOGRAM;  Surgeon: Kandis Cocking, MD;  Location: [facility] ORS;  Service: General;  Laterality: N/A;    Current Outpatient Prescriptions  Medication Sig Dispense Refill  . aspirin 81 MG tablet Take 81 mg by mouth every morning.       . diazepam (VALIUM) 5 MG tablet Take 1 tablet (5 mg total) by mouth every 6 (six) hours as needed. For anxiety.  60 tablet   5  . escitalopram (LEXAPRO) 20 MG tablet Take 1 tablet (20 mg total) by mouth daily.  30 tablet  11  . nitroGLYCERIN (NITROSTAT) 0.4 MG SL tablet Place 0.4 mg under the tongue every 5 (five) minutes as needed. For chest pain.      . rosuvastatin (CRESTOR) 20 MG tablet Take 20 mg by mouth at bedtime.      [name] DISCONTD: CRESTOR 20 MG tablet TAKE 1 TABLET (20 MG TOTAL) BY MOUTH AT BEDTIME.  30 tablet  11    Physical Exam: Filed Vitals:   01/09/12 1628  BP: 120/68  Pulse: 59  Height: 5\' 6"  (1.676 m)  Weight: 213 lb (96.616 kg)  SpO2: 95%    GEN- The patient is well appearing, alert and oriented x 3 today.   Head- normocephalic, atraumatic Eyes-  Sclera clear, conjunctiva pink Ears- hearing intact Oropharynx- clear Lungs- Clear to ausculation bilaterally, normal work of breathing Heart- Regular rate and rhythm, 2/6 SEM LUSB (early peaking), rubs or gallops, PMI not laterally displaced GI- soft, NT, ND, + BS Extremities- no clubbing, cyanosis, or edema  ekg today reveals sinus rhythm 59 bpm, PR 182, QRS 106, otherwise normal  ekg  Assessment and Plan:

## 2012-01-09 NOTE — Assessment & Plan Note (Signed)
No ischemic symptoms I have reviewed his cath with Dr Excell Seltzer today.  As he is > 12 months s/p PCI, per guidelines, we will stop plavix.  Continue ASA. Check fasting lipids

## 2012-01-09 NOTE — Assessment & Plan Note (Signed)
He remains quit! 

## 2012-01-09 NOTE — Assessment & Plan Note (Signed)
Mild by previous echo and by exam today Consider repeat echo upon return

## 2012-01-09 NOTE — Patient Instructions (Signed)
Your physician wants you to follow-up in: 6 months with Sunday Spillers and 12 months with Dr Jacquiline Doe will receive a reminder letter in the mail two months in advance. If you don't receive a letter, please call our office to schedule the follow-up appointment.  Your physician has recommended you make the following change in your medication:  1) Stop Plavix  Your physician recommends that you return for lab work at patient convenience

## 2012-01-09 NOTE — Assessment & Plan Note (Signed)
Continue crestor Return for fasting lipids and liver profile this week

## 2012-01-09 NOTE — Assessment & Plan Note (Signed)
Given snoring and am fatigue, we will obtain a sleep study Check TFTs and testosterone also

## 2012-01-10 ENCOUNTER — Other Ambulatory Visit (INDEPENDENT_AMBULATORY_CARE_PROVIDER_SITE_OTHER): Payer: BC Managed Care – PPO

## 2012-01-10 DIAGNOSIS — E785 Hyperlipidemia, unspecified: Secondary | ICD-10-CM

## 2012-01-10 DIAGNOSIS — R5383 Other fatigue: Secondary | ICD-10-CM

## 2012-01-10 LAB — TSH: TSH: 0.89 u[IU]/mL (ref 0.35–5.50)

## 2012-01-10 LAB — TESTOSTERONE: Testosterone: 326.57 ng/dL — ABNORMAL LOW (ref 350.00–890.00)

## 2012-01-10 LAB — HEPATIC FUNCTION PANEL
AST: 17 U/L (ref 0–37)
Albumin: 3.8 g/dL (ref 3.5–5.2)
Alkaline Phosphatase: 72 U/L (ref 39–117)
Bilirubin, Direct: 0.2 mg/dL (ref 0.0–0.3)
Total Bilirubin: 1.1 mg/dL (ref 0.3–1.2)

## 2012-01-10 LAB — LIPID PANEL
HDL: 47.9 mg/dL (ref >39.00)
LDL Cholesterol: 68 mg/dL (ref 0–99)
VLDL: 18.4 mg/dL (ref 0.0–40.0)

## 2012-01-10 LAB — T4, FREE: Free T4: 0.79 ng/dL (ref 0.60–1.60)

## 2012-01-13 ENCOUNTER — Ambulatory Visit (HOSPITAL_BASED_OUTPATIENT_CLINIC_OR_DEPARTMENT_OTHER): Payer: BC Managed Care – PPO | Attending: Internal Medicine

## 2012-01-13 VITALS — Ht 66.0 in | Wt 212.0 lb

## 2012-01-13 DIAGNOSIS — R5383 Other fatigue: Secondary | ICD-10-CM

## 2012-01-13 DIAGNOSIS — G4733 Obstructive sleep apnea (adult) (pediatric): Secondary | ICD-10-CM | POA: Insufficient documentation

## 2012-01-16 ENCOUNTER — Telehealth: Payer: Self-pay | Admitting: Internal Medicine

## 2012-01-16 NOTE — Telephone Encounter (Signed)
lmom for patient with his results of labs and that the sleep study results are not ready yet  Will call him when they are done

## 2012-01-16 NOTE — Telephone Encounter (Signed)
Pt' s wife calling for results of blood test and sleep study last week, would like results [phone] pt cell

## 2012-01-24 DIAGNOSIS — G4710 Hypersomnia, unspecified: Secondary | ICD-10-CM

## 2012-01-24 DIAGNOSIS — G4730 Sleep apnea, unspecified: Secondary | ICD-10-CM

## 2012-01-25 NOTE — Procedures (Signed)
NAME:  Luis [name], Luis [name] NO.:  [id number]  MEDICAL RECORD NO.:  [id number]          PATIENT TYPE:  OUT  LOCATION:  SLEEP CENTER                 FACILITY:  [facility]  PHYSICIAN:  Barbaraann Share, MD,FCCPDATE OF BIRTH:  10[birth year]  DATE OF STUDY:  01/13/2012                           NOCTURNAL POLYSOMNOGRAM  REFERRING PHYSICIAN:  Hillis Range, MD  LOCATION:  Sleep Lab.  INDICATION FOR STUDY:  Hypersomnia with sleep apnea.  EPWORTH SLEEPINESS SCORE:  10.  MEDICATIONS:  SLEEP ARCHITECTURE:  The patient had total sleep time of 342 minutes with no slow-wave sleep and only 60 minutes of REM.  Sleep onset latency was normal at 17 minutes, and REM onset was prolonged at 160 minutes. Sleep efficiency was moderately reduced at 75%.  RESPIRATORY DATA:  The patient was found to have 54 apneas and 87 obstructive hypopneas, giving him an apnea-hypopnea index of 25 events per hour.  The events occurred in all body positions and there was loud snoring noted throughout.  Attempts were made to do a split night protocol, however, the patient was unable to tolerate CPAP.  This study was then continued as a standard NPSG.  It should be noted the patient was tried on three different masks prior to the study, and a Swift FX medium appeared to be his best tolerance.  OXYGEN DATA:  There was O2 desaturation as low as 82% with the patient's obstructive events.  CARDIAC DATA:  No clinically significant arrhythmias were noted.  MOVEMENT-PARASOMNIA:  The patient was found to have 642 periodic limb movements, with 2 per hour resulting in arousal or awakening.  There were no abnormal behaviors seen.  IMPRESSIONS-RECOMMENDATIONS: 1. Moderate obstructive sleep apnea/hypopnea syndrome with an AHI of     25 events per hour and O2 desaturation as low as 82%.  Treatment     for this degree of sleep apnea can include a trial of weight loss     alone, upper airway surgery, dental appliance, and  also a     continuous positive airway pressure.  It should be noted attempts     were made to do a split night protocol with a ResMed Swift FX     medium nasal pillows; however, the patient could not tolerate     continuous positive airway pressure and the study was continued as     a standard NPSG.  If continuous positive airway pressure is chosen     as the patient's treatment modality, he will need aggressive     desensitization. 2. Very large numbers of periodic limb movements with significant     sleep disruption.  It is unclear if the patient's     leg jerks are related to his sleep-disordered breathing, or whether     he may have a concomitant primary movement disorder of sleep.     Clinical correlation is suggested.     Barbaraann Share, MD,FCCP Diplomate, American Board of Sleep Medicine    KMC/MEDQ  D:  01/24/2012 08:31:52  T:  01/25/2012 00:02:25  Job:  [id number]

## 2012-01-26 ENCOUNTER — Telehealth: Payer: Self-pay | Admitting: *Deleted

## 2012-01-26 DIAGNOSIS — G4730 Sleep apnea, unspecified: Secondary | ICD-10-CM

## 2012-01-26 NOTE — Telephone Encounter (Signed)
Called and left message for patient that Dr Johney Frame had reviewed his sleep study results and taht he needs to be referred to sleep MD for CPAP

## 2012-01-31 ENCOUNTER — Telehealth: Payer: Self-pay | Admitting: Cardiology

## 2012-01-31 NOTE — Telephone Encounter (Signed)
Patient called no answer.Left message on personal voice mail schedulers will be calling with appointment with pulmonary Dr for cpap.

## 2012-01-31 NOTE — Telephone Encounter (Signed)
Pt rtn call, not sure who called something about an appt, he thought he was cleared for appts after last visit, pls call

## 2012-02-13 ENCOUNTER — Encounter: Payer: Self-pay | Admitting: Pulmonary Disease

## 2012-02-13 ENCOUNTER — Ambulatory Visit (INDEPENDENT_AMBULATORY_CARE_PROVIDER_SITE_OTHER): Payer: BC Managed Care – PPO | Admitting: Pulmonary Disease

## 2012-02-13 VITALS — BP 118/72 | HR 57 | Temp 97.7°F | Ht 66.0 in | Wt 216.0 lb

## 2012-02-13 DIAGNOSIS — G4733 Obstructive sleep apnea (adult) (pediatric): Secondary | ICD-10-CM | POA: Insufficient documentation

## 2012-02-13 NOTE — Patient Instructions (Addendum)
Will start on cpap at moderate pressure level.  Please call if having tolerance issues. followup with me in 6 weeks.

## 2012-02-13 NOTE — Progress Notes (Signed)
  Subjective:    Patient ID: Luis [name], male    DOB: March 29, 1956, 56 y.o.   MRN: [id number]  HPI The patient is a 56 year old male who I've been asked to see for management of obstructive sleep apnea.  He recently underwent a sleep study where he was found to have moderate obstructive sleep apnea, with an AHI of 25 events per hour.  He was also found to have large numbers of periodic limb movements, with mild sleep disruption.  The patient notes loud snoring, and has been told that he has an abnormal breathing pattern during sleep.  He has frequent awakenings at night, and is not rested in the mornings upon arising.  His wife has commented on frequent leg kicks during the night, but he denies any history consistent with restless leg syndrome.  He notes definite sleep pressure with periods of inactivity, and states that he is unable to stay inside in the evenings without falling asleep.  He struggles staying awake with movies and TV shows.  He denies any issues with sleepiness while driving unless greater than 3 hours.  The patient states that his weight is up 25 pounds over the last 2 years, and his Epworth score today is 13.  Sleep Questionnaire: What time do you typically go to bed?( Between what hours) 9-9:30 pm How long does it take you to fall asleep? depends on How many times during the night do you wake up? What time do you get out of bed to start your day? 0600 Do you drive or operate heavy machinery in your occupation? No How much has your weight changed (up or down) over the past two years? (In pounds) 0 oz (0 kg) Have you ever had a sleep study before? Yes If yes, location of study? [facility] If yes, date of study? 01-13-12 Do you currently use CPAP? No Do you wear oxygen at any time? No    Review of Systems  Constitutional: Negative for fever and unexpected weight change.  HENT: Negative for ear pain, nosebleeds, congestion, sore throat, rhinorrhea, sneezing, trouble swallowing, dental problem,  postnasal drip and sinus pressure.   Eyes: Negative for redness and itching.  Respiratory: Negative for cough, chest tightness, shortness of breath and wheezing.   Cardiovascular: Negative for palpitations and leg swelling.  Gastrointestinal: Negative for nausea and vomiting.  Genitourinary: Negative for dysuria.  Musculoskeletal: Negative for joint swelling.  Skin: Negative for rash.  Neurological: Negative for headaches.  Hematological: Does not bruise/bleed easily.  Psychiatric/Behavioral: Positive for dysphoric mood. The patient is nervous/anxious.        Objective:   Physical Exam Constitutional:  Overweight male, no acute distress  HENT:  Nares patent without discharge, but narrowed.  Oropharynx without exudate, uvula normal, palate mildly elongated.   Eyes:  Perrla, eomi, no scleral icterus  Neck:  No JVD, no TMG  Cardiovascular:  Normal rate, regular rhythm, no rubs or gallops.  3/6 blowing murmur.         Intact distal pulses  Pulmonary :  Normal breath sounds, no stridor or respiratory distress   No rales, rhonchi, or wheezing  Abdominal:  Soft, nondistended, bowel sounds present.  No tenderness noted.   Musculoskeletal:  No lower extremity edema noted.  Lymph Nodes:  No cervical lymphadenopathy noted  Skin:  No cyanosis noted  Neurologic:  Alert, appropriate, moves all 4 extremities without obvious deficit.         Assessment & Plan:

## 2012-02-13 NOTE — Assessment & Plan Note (Signed)
The patient has moderate obstructive sleep apnea by his recent study, and clearly is very symptomatic.  He also has underlying cardiovascular issues as well.  He was found to have a lot of leg kicks on his sleep study, but is not symptomatic in the evening or at bedtime.  These may just be secondary to his sleep disordered breathing.  I have reviewed the various treatment options for sleep apnea, but have recommended a trial of CPAP while he works on weight loss.  The patient is agreeable to this approach.

## 2012-03-26 ENCOUNTER — Encounter: Payer: Self-pay | Admitting: Pulmonary Disease

## 2012-03-26 ENCOUNTER — Ambulatory Visit (INDEPENDENT_AMBULATORY_CARE_PROVIDER_SITE_OTHER): Payer: BC Managed Care – PPO | Admitting: Pulmonary Disease

## 2012-03-26 VITALS — BP 122/82 | HR 69 | Temp 97.6°F | Ht 66.0 in | Wt 218.0 lb

## 2012-03-26 DIAGNOSIS — G4733 Obstructive sleep apnea (adult) (pediatric): Secondary | ICD-10-CM

## 2012-03-26 NOTE — Patient Instructions (Addendum)
Will turn your machine to the auto mode to optimize your pressure.  Will let you know the results. Work on weight loss If doing well, followup with me in 6mos.  If having issues, please call me.

## 2012-03-26 NOTE — Assessment & Plan Note (Signed)
The patient is wearing CPAP compliantly, and has seen some improvement in his sleep.  He is still getting used to the machine, but will continue to make the effort to wear the device.  I have told him that we need to optimize his pressure, and we used the automatic settings on his machine to do this.  I also encouraged him to work aggressively on weight loss. Care Plan:  At this point, will arrange for the patient's machine to be changed over to auto mode for 2 weeks to optimize their pressure.  I will review the downloaded data once sent by dme, and also evaluate for compliance, leaks, and residual osa.  I will call the patient and dme to discuss the results, and have the patient's machine set appropriately.  This will serve as the pt's cpap pressure titration.

## 2012-03-26 NOTE — Progress Notes (Signed)
  Subjective:    Patient ID: Luis [name], male    DOB: 27-Dec-1955, 56 y.o.   MRN: [id number]  HPI Patient comes in today for followup of his known obstructive sleep apnea.  He was started on CPAP at the last visit a moderate pressure level, and returns for evaluation.  He has been wearing CPAP compliantly, but does have tolerance issues at night on occasion.  He will sometimes pull the mask off during sleep, but then will awaken and put it back on.  He thinks that he feels more rested in the mornings, but has not seen a big difference in his daytime alertness.   Review of Systems  Constitutional: Negative for fever and unexpected weight change.  HENT: Positive for congestion and rhinorrhea. Negative for ear pain, nosebleeds, sore throat, sneezing, trouble swallowing, dental problem, postnasal drip and sinus pressure.   Eyes: Negative for redness and itching.  Respiratory: Positive for cough. Negative for chest tightness, shortness of breath and wheezing.   Cardiovascular: Negative for palpitations and leg swelling.  Gastrointestinal: Negative for nausea and vomiting.  Genitourinary: Negative for dysuria.  Musculoskeletal: Negative for joint swelling.  Skin: Negative for rash.  Neurological: Negative for headaches.  Hematological: Bruises/bleeds easily.  Psychiatric/Behavioral: Positive for dysphoric mood. The patient is nervous/anxious.        Objective:   Physical Exam Overweight male in no acute distress Nose without purulence or discharge noted No skin breakdown or pressure necrosis from the CPAP mask Neck without lymphadenopathy or thyromegaly Lower extremities without edema, no cyanosis Alert and oriented, does not appear to be sleepy, moves all 4 extremities.       Assessment & Plan:

## 2012-04-21 ENCOUNTER — Other Ambulatory Visit: Payer: Self-pay | Admitting: Family Medicine

## 2012-04-27 NOTE — Telephone Encounter (Signed)
Call in #[phone] with 5 rf 

## 2012-05-07 ENCOUNTER — Encounter: Payer: Self-pay | Admitting: Internal Medicine

## 2012-06-10 ENCOUNTER — Other Ambulatory Visit: Payer: Self-pay | Admitting: Pulmonary Disease

## 2012-06-10 DIAGNOSIS — G4733 Obstructive sleep apnea (adult) (pediatric): Secondary | ICD-10-CM

## 2012-06-12 ENCOUNTER — Telehealth: Payer: Self-pay | Admitting: Internal Medicine

## 2012-06-12 NOTE — Telephone Encounter (Signed)
Advanced Let pt and dme know that optimal pressure is 13cm. ---  I spoke with patient about results and he verbalized understanding and had no questions.

## 2012-07-02 ENCOUNTER — Encounter: Payer: Self-pay | Admitting: Family Medicine

## 2012-07-02 ENCOUNTER — Ambulatory Visit (INDEPENDENT_AMBULATORY_CARE_PROVIDER_SITE_OTHER): Payer: BC Managed Care – PPO | Admitting: Family Medicine

## 2012-07-02 VITALS — BP 120/80 | HR 69 | Temp 98.5°F | Wt 224.0 lb

## 2012-07-02 DIAGNOSIS — L02229 Furuncle of trunk, unspecified: Secondary | ICD-10-CM

## 2012-07-02 MED ORDER — DOXYCYCLINE HYCLATE 100 MG PO CAPS: 100.0000 mg | ORAL_CAPSULE | Freq: Two times a day (BID) | ORAL | Status: AC

## 2012-07-02 NOTE — Progress Notes (Signed)
  Subjective:    Patient ID: Luis [name], male    DOB: [birth year], 57 y.o.   MRN: [id number]  HPI Here for one week of a painful boil on the upper back. This is same site where we lanced a boil several years ago. No fever    Review of Systems  Constitutional: Negative.        Objective:   Physical Exam  Constitutional: He appears well-developed and well-nourished.  Skin:  The middle upper back has a large tender boil           Assessment & Plan:  The boil was lanced with a scalpel and a large amount of purulent fluid was extracted . The wound was dressed with gauze. Given Doxycycline while we wait for the culture report.

## 2012-07-05 LAB — WOUND CULTURE: Organism ID, Bacteria: NO GROWTH

## 2012-07-06 NOTE — Progress Notes (Signed)
Quick Note:  I spoke with pt ______ 

## 2012-11-19 ENCOUNTER — Encounter: Payer: Self-pay | Admitting: Family Medicine

## 2012-11-19 ENCOUNTER — Ambulatory Visit (INDEPENDENT_AMBULATORY_CARE_PROVIDER_SITE_OTHER): Payer: BC Managed Care – PPO | Admitting: Family Medicine

## 2012-11-19 VITALS — BP 124/80 | HR 64 | Temp 97.9°F | Wt 230.0 lb

## 2012-11-19 DIAGNOSIS — B86 Scabies: Secondary | ICD-10-CM

## 2012-11-19 MED ORDER — METHYLPREDNISOLONE ACETATE 80 MG/ML IJ SUSP
120.0000 mg | Freq: Once | INTRAMUSCULAR | Status: AC
  Administered 2012-11-19: 120 mg via INTRAMUSCULAR

## 2012-11-19 MED ORDER — MALATHION 0.5 % EX LOTN: TOPICAL_LOTION | CUTANEOUS | Status: DC

## 2012-11-19 NOTE — Progress Notes (Signed)
  Subjective:    Patient ID: Luis [name], male    DOB: 01-11-1956, 57 y.o.   MRN: [id number]  HPI Here for 3 days of itchy rash over both legs and around the waist. He feels fine in general. Using benadryl.    Review of Systems  Constitutional: Negative.   Skin: Positive for rash.       Objective:   Physical Exam  Constitutional: He appears well-developed and well-nourished.  Skin:  Widespread red tiny papules and macules over the lower body           Assessment & Plan:  Scabies. Treat tonight with Ovide and repeat tomorrow night if needed

## 2012-11-19 NOTE — Addendum Note (Signed)
Addended by: Aniceto Boss A on: 11/19/2012 11:52 AM   Modules accepted: Orders

## 2012-11-30 ENCOUNTER — Telehealth: Payer: Self-pay | Admitting: Family Medicine

## 2012-11-30 ENCOUNTER — Ambulatory Visit: Payer: Self-pay | Admitting: Family Medicine

## 2012-11-30 NOTE — Telephone Encounter (Signed)
Patient Information:  Caller Name: Inocencio Homes  Phone: [phone]  Patient: Lenice Llamas  Gender: Male  DOB: 16-Feb-1956  Age: 57 Years  PCP: Gershon Crane [facility])  Office Follow Up:  Does the office need to follow up with this patient?: No  Instructions For The Office: N/A  RN Note:  Diagnosed with scabies, and given Ovide, and used as directed.  States there are more blisters, now on the upper body as well.  Itching is intense and keeping him from sleeping.  States is out of Ovide and wants to try something else, or refill the Rx.  States no one else in the family is affected.  Per rash protocol, emergent symptoms denied; disposition See Today in Office.  Appt scheduled 11/30/12 1515 with Dr. Clent Ridges.  krs/can  Symptoms  Reason For Call & Symptoms: rash; seen in office 11/19/12 and diagnosed with scabies  Reviewed Health History In EMR: Yes  Reviewed Medications In EMR: Yes  Reviewed Allergies In EMR: Yes  Reviewed Surgeries / Procedures: Yes  Date of Onset of Symptoms: Unknown  Guideline(s) Used:  Rash or Redness - Widespread  Disposition Per Guideline:   See Today in Office  Reason For Disposition Reached:   Severe itching  Advice Given:  N/A  Patient Will Follow Care Advice:  YES  Appointment Scheduled:  11/30/2012 15:15:00 Appointment Scheduled Provider:  Gershon Crane [facility])

## 2012-12-10 ENCOUNTER — Encounter: Payer: Self-pay | Admitting: Family Medicine

## 2012-12-10 ENCOUNTER — Ambulatory Visit (INDEPENDENT_AMBULATORY_CARE_PROVIDER_SITE_OTHER): Payer: BC Managed Care – PPO | Admitting: Family Medicine

## 2012-12-10 VITALS — BP 140/98 | Temp 98.1°F | Wt 231.0 lb

## 2012-12-10 DIAGNOSIS — L739 Follicular disorder, unspecified: Secondary | ICD-10-CM

## 2012-12-10 DIAGNOSIS — L738 Other specified follicular disorders: Secondary | ICD-10-CM

## 2012-12-10 MED ORDER — DOXYCYCLINE HYCLATE 100 MG PO CAPS: 100.0000 mg | ORAL_CAPSULE | Freq: Two times a day (BID) | ORAL | Status: AC

## 2012-12-10 NOTE — Progress Notes (Signed)
  Subjective:    Patient ID: Luis [name], male    DOB: 1955/07/01, 57 y.o.   MRN: [id number]  HPI Here for continuing skin rashes. He was seen here last month for red spots over the legs which appeared to be from scabies. He used Ovide lotion once and the spots seemed to improve. They faded away somewhat and the itching improved for about a week. Then the itching returned and he developed new red spots over the entire trunk. His wife still has no skin lesions at all.    Review of Systems  Constitutional: Negative.   Skin: Positive for rash.       Objective:   Physical Exam  Constitutional: He appears well-developed and well-nourished. No distress.  Skin:  Widespread isolated red spots over the trunk and legs. The newer ones over the back are pustular           Assessment & Plan:  Now this appears to be a folliculitis. Given 10 days of Doxycycline. Recheck prn

## 2012-12-14 ENCOUNTER — Other Ambulatory Visit: Payer: Self-pay | Admitting: Family Medicine

## 2012-12-17 NOTE — Telephone Encounter (Signed)
Call in #[phone] with 5 rf 

## 2012-12-24 ENCOUNTER — Other Ambulatory Visit: Payer: Self-pay | Admitting: Internal Medicine

## 2013-01-11 ENCOUNTER — Ambulatory Visit (INDEPENDENT_AMBULATORY_CARE_PROVIDER_SITE_OTHER): Payer: BC Managed Care – PPO | Admitting: Family Medicine

## 2013-01-11 ENCOUNTER — Encounter: Payer: Self-pay | Admitting: Family Medicine

## 2013-01-11 VITALS — BP 120/78 | Wt 231.0 lb

## 2013-01-11 DIAGNOSIS — H8110 Benign paroxysmal vertigo, unspecified ear: Secondary | ICD-10-CM

## 2013-01-11 DIAGNOSIS — R0602 Shortness of breath: Secondary | ICD-10-CM

## 2013-01-11 MED ORDER — MECLIZINE HCL 25 MG PO TABS: 25.0000 mg | ORAL_TABLET | Freq: Three times a day (TID) | ORAL | Status: DC | PRN

## 2013-01-11 MED ORDER — MECLIZINE HCL 32 MG PO TABS: 32.0000 mg | ORAL_TABLET | Freq: Three times a day (TID) | ORAL | Status: DC | PRN

## 2013-01-11 NOTE — Patient Instructions (Addendum)
Benign Positional Vertigo Vertigo means you feel like you or your surroundings are moving when they are not. Benign positional vertigo is the most common form of vertigo. Benign means that the cause of your condition is not serious. Benign positional vertigo is more common in older adults. CAUSES  Benign positional vertigo is the result of an upset in the labyrinth system. This is an area in the middle ear that helps control your balance. This may be caused by a viral infection, head injury, or repetitive motion. However, often no specific cause is found. SYMPTOMS  Symptoms of benign positional vertigo occur when you move your head or eyes in different directions. Some of the symptoms may include:  Loss of balance and falls.  Vomiting.  Blurred vision.  Dizziness.  Nausea.  Involuntary eye movements (nystagmus). DIAGNOSIS  Benign positional vertigo is usually diagnosed by physical exam. If the specific cause of your benign positional vertigo is unknown, your caregiver may perform imaging tests, such as magnetic resonance imaging (MRI) or computed tomography (CT). TREATMENT  Your caregiver may recommend movements or procedures to correct the benign positional vertigo. Medicines such as meclizine, benzodiazepines, and medicines for nausea may be used to treat your symptoms. In rare cases, if your symptoms are caused by certain conditions that affect the inner ear, you may need surgery. HOME CARE INSTRUCTIONS   Follow your caregiver's instructions.  Move slowly. Do not make sudden body or head movements.  Avoid driving.  Avoid operating heavy machinery.  Avoid performing any tasks that would be dangerous to you or others during a vertigo episode.  Drink enough fluids to keep your urine clear or pale yellow. SEEK IMMEDIATE MEDICAL CARE IF:   You develop problems with walking, weakness, numbness, or using your arms, hands, or legs.  You have difficulty speaking.  You develop  severe headaches.  Your nausea or vomiting continues or gets worse.  You develop visual changes.  Your family or friends notice any behavioral changes.  Your condition gets worse.  You have a fever.  You develop a stiff neck or sensitivity to light.  SEEK MEDICAL CARE IMMEDIATELY IF CHEST PAIN or TIGHTNESS, TROUBLE BREATHING, JAW PAIN  MAKE SURE YOU:   Understand these instructions.  Will watch your condition.  Will get help right away if you are not doing well or get worse. Document Released: 01/17/2006 Document Revised: 07/04/2011 Document Reviewed: 12/30/2010 [facility] Patient Information 2014 [place], [place].

## 2013-01-11 NOTE — Addendum Note (Signed)
Addended by: Azucena Freed on: 01/11/2013 04:40 PM   Modules accepted: Orders, Medications

## 2013-01-11 NOTE — Progress Notes (Signed)
Chief Complaint  Patient presents with  . 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  Past Medical History  Diagnosis Date  . Aortic stenosis, mild 03-24-04    per cath 8/12  . Anxiety   . Carotid stenosis     11/09 dopplers with 0-39% bilat. ICA  . History of colonic polyps     hyperplastic  . Rectal bleeding   . CAD (coronary artery disease)     s/p PCI of the RCA 8/12 with DES by Dr Excell Seltzer, preserved EF  . External hemorrhoids   . Tobacco use disorder     quit since 9/12  . Heart murmur   . Heart attack   . ED (erectile dysfunction)   . Sleep apnea     STOPBANG=5    Past Surgical History  Procedure Laterality Date  . Tonsillectomy    . Cataract extraction   4 YRS AGO    BOTH EYES  . Right elbow surgery    . Solonscopy  05/23/08    per Dr. Celene Kras hemorrhoids only, repeat in 5 years  . Neck surgery  03/23/09    per Dr. Ophelia Charter, cervical fusion   . Coronary stenting      s/p PCI of the RCA by Dr Excell Seltzer 8/12 with 2 promus stents  . Cholecystectomy  07/21/2011    Procedure: LAPAROSCOPIC CHOLECYSTECTOMY WITH INTRAOPERATIVE CHOLANGIOGRAM;  Surgeon: Kandis Cocking, MD;  Location: [facility];  Service: General;  Laterality: N/A;    Family History  Problem Relation Age of Onset  . Cancer Other     colon  . Heart disease Other   . Colon cancer Neg Hx   . Cancer Mother     lung    History   Social History  . Marital  Status: Married    Spouse Name: N/A    Number of Children: N/A  . Years of Education: N/A   Occupational History  . [employer]    Social History Main Topics  . Smoking status: Former Smoker -- 1.00 packs/day for 40 years    Quit date: 01/19/2011  . Smokeless tobacco: Never Used     Comment: will prescribe chantix  . Alcohol Use: No  . Drug Use: No  . Sexual Activity: None   Other Topics Concern  . None   Social History Narrative  . None    Current outpatient prescriptions:aspirin 81 MG tablet, Take 81 mg by mouth every morning. , Disp: , Rfl: ;  CRESTOR 20 MG tablet, TAKE 1 TABLET (20 MG TOTAL) BY MOUTH AT BEDTIME., Disp: 30 tablet, Rfl: 1;  diazepam (VALIUM) 5 MG tablet, TAKE 1 TABLET BY MOUTH EVERY 6 HOURS, Disp: 60 tablet, Rfl: 5;  nitroGLYCERIN (NITROSTAT) 0.4 MG SL tablet, Place 0.4 mg under the tongue every 5 (five) minutes as needed. For chest pain., Disp: , Rfl:  rosuvastatin (CRESTOR) 20 MG tablet, Take  20 mg by mouth at bedtime., Disp: , Rfl: ;  escitalopram (LEXAPRO) 20 MG tablet, Take 1 tablet (20 mg total) by mouth daily., Disp: 30 tablet, Rfl: 11;  meclizine (ANTIVERT) 32 MG tablet, Take 1 tablet (32 mg total) by mouth 3 (three) times daily as needed., Disp: 30 tablet, Rfl: 0  EXAM:  Filed Vitals:   01/11/13 1420  BP: 120/78  Orthostatic negative  Body mass index is 37.3 kg/(m^2).  GENERAL: vitals reviewed and listed above, alert, oriented, appears well hydrated and in no acute distress  HEENT: atraumatic, PERRLA, conjunttiva clear, no obvious abnormalities on inspection of external nose and ears  NECK: no obvious masses on inspection  LUNGS: clear to auscultation bilaterally, no wheezes, rales or rhonchi, good air movement  CV: HRRR, SEM II/VI, no peripheral edema  MS: moves all extremities without noticeable abnormality  NEURO: dix-hallpike abnormal to R with reproduction of symptoms and rotational nystagmus, CN II-XII grossly intact, finger to nose  normal  PSYCH: pleasant and cooperative, no obvious depression or anxiety  ASSESSMENT AND PLAN:  Discussed the following assessment and plan:  BPPV (benign paroxysmal positional vertigo) - Plan: meclizine (ANTIVERT) 32 MG tablet, Ambulatory referral to Physical Therapy  Benign paroxysmal positional vertigo - Plan: meclizine (ANTIVERT) 32 MG tablet, Ambulatory referral to Physical Therapy  SOB (shortness of breath) - Plan: EKG 12-Lead  -hx and exam findings classic for BPPV - denies any symptoms that would suggest cardiac or other neurological etiology, dix hallpike + and reproduced symptoms - otherwise normal neurol exam -EKG given PMH done and unchanged from previous, orthostatics normal -referral for vestibular rehab and meclizine given -return and emergency precuations given - discussed that hx and exam are strongly suggestive of middle ear issues, but given his hx of heart issues advised if any pre-syncope, CP, SOB, DOE, HA returns, or other symptoms or otherwise of course should be evaluated immediately/EMS if any concern for heart issues -Patient advised to return or notify a doctor immediately if symptoms worsen or persist or new concerns arise.  There are no Patient Instructions on file for this visit.   Kriste Basque R.

## 2013-01-23 ENCOUNTER — Other Ambulatory Visit: Payer: Self-pay | Admitting: Family Medicine

## 2013-02-24 ENCOUNTER — Other Ambulatory Visit: Payer: Self-pay | Admitting: Internal Medicine

## 2013-03-01 ENCOUNTER — Other Ambulatory Visit: Payer: Self-pay | Admitting: Internal Medicine

## 2013-03-08 ENCOUNTER — Encounter: Payer: Self-pay | Admitting: Gastroenterology

## 2013-04-09 ENCOUNTER — Other Ambulatory Visit: Payer: Self-pay | Admitting: Internal Medicine

## 2013-05-15 ENCOUNTER — Other Ambulatory Visit: Payer: Self-pay

## 2013-05-15 MED ORDER — ROSUVASTATIN CALCIUM 20 MG PO TABS
ORAL_TABLET | ORAL | Status: DC
Start: 2013-05-15 — End: 2013-05-22

## 2013-05-22 ENCOUNTER — Encounter (INDEPENDENT_AMBULATORY_CARE_PROVIDER_SITE_OTHER): Payer: Self-pay

## 2013-05-22 ENCOUNTER — Encounter: Payer: Self-pay | Admitting: Physician Assistant

## 2013-05-22 ENCOUNTER — Ambulatory Visit (INDEPENDENT_AMBULATORY_CARE_PROVIDER_SITE_OTHER): Payer: BC Managed Care – PPO | Admitting: Physician Assistant

## 2013-05-22 VITALS — BP 110/72 | HR 56 | Ht 66.0 in | Wt 233.0 lb

## 2013-05-22 DIAGNOSIS — I359 Nonrheumatic aortic valve disorder, unspecified: Secondary | ICD-10-CM

## 2013-05-22 DIAGNOSIS — E785 Hyperlipidemia, unspecified: Secondary | ICD-10-CM

## 2013-05-22 DIAGNOSIS — I6529 Occlusion and stenosis of unspecified carotid artery: Secondary | ICD-10-CM

## 2013-05-22 DIAGNOSIS — I2510 Atherosclerotic heart disease of native coronary artery without angina pectoris: Secondary | ICD-10-CM

## 2013-05-22 MED ORDER — ROSUVASTATIN CALCIUM 20 MG PO TABS: ORAL_TABLET | ORAL | Status: DC

## 2013-05-22 MED ORDER — NITROGLYCERIN 0.4 MG SL SUBL: 0.4000 mg | SUBLINGUAL_TABLET | SUBLINGUAL | Status: DC | PRN

## 2013-05-22 NOTE — Addendum Note (Signed)
Addended by: Michae Kava on: 05/22/2013 04:59 PM   Modules accepted: Orders

## 2013-05-22 NOTE — Patient Instructions (Addendum)
REFILL FOR CRESTOR  REFILL FOR NTG SENT IN TODAY  LAB WORK; FASTING LIPID AND LIVER PANEL, BMET  Your physician has requested that you have an echocardiogram DX 424.1. Echocardiography is a painless test that uses sound waves to create images of your heart. It provides your doctor with information about the size and shape of your heart and how well your heart's chambers and valves are working. This procedure takes approximately one hour. There are no restrictions for this procedure.  Your physician has requested that you have a carotid duplex DX 433.10. This test is an ultrasound of the carotid arteries in your neck. It looks at blood flow through these arteries that supply the brain with blood. Allow one hour for this exam. There are no restrictions or special instructions.  Your physician wants you to follow-up in: [place] DR. ALLRED You will receive a reminder letter in the mail two months in advance. If you don't receive a letter, please call our office to schedule the follow-up appointment.

## 2013-05-22 NOTE — Progress Notes (Signed)
[address] [place], [place]  [address] Phone: [phone] Fax:  [phone]  Date:  05/22/2013   ID:  Luis [name], DOB 1955-05-29, MRN [id number]  PCP:  Laurey Morale, MD  Cardiologist:  Dr. Thompson Grayer     History of Present Illness: Luis [name] is a 58 y.o. male with a hx of CAD, aortic stenosis, mild carotid stenosis, T2DM, HTN, HL, tobacco abuse.  He was initially seen in 11/2010 for chest pain.  Echo demonstrated normal LVF, mod diastolic dysfunction, mod AS (mean 22), mild AI, mild LAE, PASP 34.  Myoview demonstrated inf scar with mild peri-infarct ischemia, EF 57%.  He was referred for cardiac cath.  LHC (12/10/2010):  mAVCFX 40, mRCA 40, dRCA 99, EF 55-60%; mild AS (peak 9 mmHg).  PCI:  Promus DES x 2 to mid and dist RCA.   Abdominal US (01/2011):  No AAA.  Carotid US (01/2011):  0-39% bilateral.  Last seen by Dr. Thompson Grayer in 12/2011.  Since last seen, he is doing well.  He denies chest pain, significant dyspnea with exertion, syncope, orthopnea, PND, edema.  He is now on CPAP for OSA.    Recent Labs: No results found for requested labs within last 365 days.   Wt Readings from Last 3 Encounters:  05/22/13 233 lb (105.688 kg)  01/11/13 231 lb (104.781 kg)  12/10/12 231 lb (104.781 kg)     Past Medical History  Diagnosis Date  . Aortic stenosis, mild 03-24-04    per cath 8/12  . Anxiety   . Carotid stenosis     11/09 dopplers with 0-39% bilat. ICA  . History of colonic polyps     hyperplastic  . Rectal bleeding   . CAD (coronary artery disease)     s/p PCI of the RCA 8/12 with DES by Dr Burt Knack, preserved EF  . External hemorrhoids   . Tobacco use disorder     quit since 9/12  . Heart murmur   . Heart attack   . ED (erectile dysfunction)   . Sleep apnea     STOPBANG=5    Current Outpatient Prescriptions  Medication Sig Dispense Refill  . aspirin 81 MG tablet Take 81 mg by mouth every morning.       . diazepam (VALIUM) 5 MG tablet TAKE  1 TABLET BY MOUTH EVERY 6 HOURS  60 tablet  5  . escitalopram (LEXAPRO) 20 MG tablet TAKE 1 TABLET BY MOUTH DAILY.  30 tablet  11  . meclizine (ANTIVERT) 25 MG tablet Take 1 tablet (25 mg total) by mouth 3 (three) times daily as needed.  30 tablet  0  . nitroGLYCERIN (NITROSTAT) 0.4 MG SL tablet Place 0.4 mg under the tongue every 5 (five) minutes as needed. For chest pain.      . rosuvastatin (CRESTOR) 20 MG tablet TAKE 1 TABLET BY MOUTH AT BEDTIME  7 tablet  0   No current facility-administered medications for this visit.    Allergies:   Review of patient's allergies indicates no known allergies.   Social History:  The patient  reports that he quit smoking about 2 years ago. He has never used smokeless tobacco. He reports that he does not drink alcohol or use illicit drugs.   Family History:  The patient's family history includes Cancer in his mother and other; Heart disease in his other. There is no history of Colon cancer.   ROS:  Please see the history of  present illness.      All other systems reviewed and negative.   PHYSICAL EXAM: VS:  BP 110/72  Pulse 56  Ht 5\' 6"  (1.676 m)  Wt 233 lb (105.688 kg)  BMI 37.63 kg/m2 Well nourished, well developed, in no acute distress HEENT: normal Neck: no JVD Vascular:  Bilateral carotid bruit vs radiating murmur Cardiac:  normal S1, S2; RRR; 2/6 harsh systolic murmurat RUSB Lungs:  clear to auscultation bilaterally, no wheezing, rhonchi or rales Abd: soft, nontender, no hepatomegaly Ext: no edema Skin: warm and dry Neuro:  CNs 2-12 intact, no focal abnormalities noted  EKG:  Sinus brady, HR 56, no significant change from prior tracing.          ASSESSMENT AND PLAN:  1. CAD:  No angina.  Continue ASA, statin.   2. Aortic Stenosis:  Mild by echo in 2012.  Arrange follow up 2D echo.   3. Hyperlipidemia:  Continue statin.  Arrange follow up BMET, Lipids, LFTs. 4. Carotid Stenosis:  Arrange follow up Carotid US. 5. Tobacco Abuse:  He  remains quit.  6. Disposition:  F/u with Dr. Thompson Grayer in 1 year.   Signed, Richardson Dopp, PA-C  05/22/2013 4:03 PM

## 2013-05-28 ENCOUNTER — Encounter: Payer: Self-pay | Admitting: Cardiology

## 2013-05-28 ENCOUNTER — Ambulatory Visit (HOSPITAL_COMMUNITY): Payer: BC Managed Care – PPO | Attending: Cardiology | Admitting: Cardiology

## 2013-05-28 ENCOUNTER — Telehealth: Payer: Self-pay | Admitting: *Deleted

## 2013-05-28 ENCOUNTER — Encounter: Payer: Self-pay | Admitting: Physician Assistant

## 2013-05-28 ENCOUNTER — Other Ambulatory Visit (INDEPENDENT_AMBULATORY_CARE_PROVIDER_SITE_OTHER): Payer: BC Managed Care – PPO

## 2013-05-28 DIAGNOSIS — E785 Hyperlipidemia, unspecified: Secondary | ICD-10-CM | POA: Insufficient documentation

## 2013-05-28 DIAGNOSIS — I10 Essential (primary) hypertension: Secondary | ICD-10-CM | POA: Insufficient documentation

## 2013-05-28 DIAGNOSIS — I2510 Atherosclerotic heart disease of native coronary artery without angina pectoris: Secondary | ICD-10-CM

## 2013-05-28 DIAGNOSIS — I059 Rheumatic mitral valve disease, unspecified: Secondary | ICD-10-CM | POA: Insufficient documentation

## 2013-05-28 DIAGNOSIS — I359 Nonrheumatic aortic valve disorder, unspecified: Secondary | ICD-10-CM | POA: Insufficient documentation

## 2013-05-28 LAB — HEPATIC FUNCTION PANEL
ALBUMIN: 3.9 g/dL (ref 3.5–5.2)
ALT: 27 U/L (ref 0–53)
AST: 22 U/L (ref 0–37)
Alkaline Phosphatase: 72 U/L (ref 39–117)
Bilirubin, Direct: 0.1 mg/dL (ref 0.0–0.3)
Total Bilirubin: 1.1 mg/dL (ref 0.3–1.2)
Total Protein: 7.1 g/dL (ref 6.0–8.3)

## 2013-05-28 LAB — LIPID PANEL
CHOLESTEROL: 146 mg/dL (ref 0–200)
HDL: 53 mg/dL (ref >39.00)
LDL Cholesterol: 71 mg/dL (ref 0–99)
Total CHOL/HDL Ratio: 3
Triglycerides: 109 mg/dL (ref 0.0–149.0)
VLDL: 21.8 mg/dL (ref 0.0–40.0)

## 2013-05-28 LAB — BASIC METABOLIC PANEL
BUN: 14 mg/dL (ref 6–23)
CHLORIDE: 105 meq/L (ref 96–112)
CO2: 28 mEq/L (ref 19–32)
Calcium: 8.8 mg/dL (ref 8.4–10.5)
Creatinine, Ser: 0.9 mg/dL (ref 0.4–1.5)
GFR: 96.62 mL/min (ref >60.00)
GLUCOSE: 97 mg/dL (ref 70–99)
POTASSIUM: 4.4 meq/L (ref 3.5–5.1)
Sodium: 138 mEq/L (ref 135–145)

## 2013-05-28 NOTE — Progress Notes (Signed)
Echo performed. 

## 2013-05-28 NOTE — Telephone Encounter (Signed)
lmptcb for lab results 

## 2013-05-30 ENCOUNTER — Telehealth: Payer: Self-pay | Admitting: *Deleted

## 2013-05-30 ENCOUNTER — Telehealth: Payer: Self-pay | Admitting: Internal Medicine

## 2013-05-30 ENCOUNTER — Encounter: Payer: Self-pay | Admitting: *Deleted

## 2013-05-30 NOTE — Telephone Encounter (Signed)
LVM with a man who answered the phone, do not know if this was a family memeber or not. I asked for ptcb in reference had test results. He said ok and will give the message. I said thank you.

## 2013-05-30 NOTE — Telephone Encounter (Signed)
New problem   Pt need to talk to nurse concerning mychart. Please call pt

## 2013-05-30 NOTE — Telephone Encounter (Signed)
Left patient a message to call me back. I do not know anything about mychart or how it's handled

## 2013-05-30 NOTE — Telephone Encounter (Signed)
ptcb and has been notified about lab results with verbal understanding. I was going to send out a result letter but pt cb before letter went out so I cancelled letter.

## 2013-06-03 ENCOUNTER — Ambulatory Visit (HOSPITAL_COMMUNITY): Payer: BC Managed Care – PPO | Attending: Internal Medicine

## 2013-06-03 DIAGNOSIS — I6529 Occlusion and stenosis of unspecified carotid artery: Secondary | ICD-10-CM | POA: Insufficient documentation

## 2013-06-03 DIAGNOSIS — E785 Hyperlipidemia, unspecified: Secondary | ICD-10-CM | POA: Insufficient documentation

## 2013-06-03 DIAGNOSIS — I2510 Atherosclerotic heart disease of native coronary artery without angina pectoris: Secondary | ICD-10-CM | POA: Insufficient documentation

## 2013-06-03 DIAGNOSIS — F17200 Nicotine dependence, unspecified, uncomplicated: Secondary | ICD-10-CM | POA: Insufficient documentation

## 2013-06-03 DIAGNOSIS — I658 Occlusion and stenosis of other precerebral arteries: Secondary | ICD-10-CM | POA: Insufficient documentation

## 2013-06-05 ENCOUNTER — Encounter: Payer: Self-pay | Admitting: Physician Assistant

## 2013-06-05 ENCOUNTER — Telehealth: Payer: Self-pay | Admitting: *Deleted

## 2013-06-05 NOTE — Telephone Encounter (Signed)
lmptcb for test results 

## 2013-06-06 ENCOUNTER — Other Ambulatory Visit: Payer: Self-pay | Admitting: *Deleted

## 2013-06-06 DIAGNOSIS — R931 Abnormal findings on diagnostic imaging of heart and coronary circulation: Secondary | ICD-10-CM

## 2013-06-07 NOTE — Telephone Encounter (Signed)
pt notified about echo results with verbal understanding, repeat echo in 2 yrs.

## 2013-06-10 ENCOUNTER — Telehealth: Payer: Self-pay | Admitting: Internal Medicine

## 2013-06-10 NOTE — Telephone Encounter (Signed)
LMTCB ./CY 

## 2013-06-10 NOTE — Telephone Encounter (Signed)
New message    Pt is having a CT thurs----what is this CT for?

## 2013-06-12 NOTE — Telephone Encounter (Signed)
Spoke with patient who states his wife called regarding reason for CT.  I advised patient of reason from Richardson Dopp, PA-C's note following echo.  Patient verbalized understanding and agreement.

## 2013-06-13 ENCOUNTER — Ambulatory Visit (INDEPENDENT_AMBULATORY_CARE_PROVIDER_SITE_OTHER)
Admission: RE | Admit: 2013-06-13 | Discharge: 2013-06-13 | Disposition: A | Discharge status: Home / Self Care | Payer: BC Managed Care – PPO | Source: Ambulatory Visit | Attending: Physician Assistant | Admitting: Physician Assistant

## 2013-06-13 DIAGNOSIS — R931 Abnormal findings on diagnostic imaging of heart and coronary circulation: Secondary | ICD-10-CM

## 2013-06-13 DIAGNOSIS — R9389 Abnormal findings on diagnostic imaging of other specified body structures: Secondary | ICD-10-CM

## 2013-06-13 MED ORDER — IOHEXOL 350 MG/ML SOLN
100.0000 mL | Freq: Once | INTRAVENOUS | Status: AC | PRN
  Administered 2013-06-13: 100 mL via INTRAVENOUS

## 2013-06-14 ENCOUNTER — Telehealth: Payer: Self-pay | Admitting: *Deleted

## 2013-06-14 NOTE — Telephone Encounter (Signed)
pt notified about CTA mild dilation of ascending aorta and will repeat CTA in 1 year, pt verbalized understanding.

## 2013-07-03 ENCOUNTER — Other Ambulatory Visit: Payer: Self-pay | Admitting: Family Medicine

## 2013-07-08 NOTE — Telephone Encounter (Signed)
Call in #60 only, he needs testing and a contract  

## 2013-07-09 NOTE — Telephone Encounter (Signed)
I spoke with pt and he will give a urine sample & sign contract.

## 2013-09-24 ENCOUNTER — Encounter: Payer: Self-pay | Admitting: Gastroenterology

## 2014-02-01 ENCOUNTER — Other Ambulatory Visit: Payer: Self-pay | Admitting: Family Medicine

## 2014-02-06 ENCOUNTER — Other Ambulatory Visit: Payer: Self-pay | Admitting: Family Medicine

## 2014-06-02 ENCOUNTER — Ambulatory Visit: Payer: BC Managed Care – PPO | Admitting: Cardiology

## 2014-06-03 ENCOUNTER — Other Ambulatory Visit: Payer: Self-pay | Admitting: Physician Assistant

## 2014-06-05 ENCOUNTER — Encounter: Payer: Self-pay | Admitting: *Deleted

## 2014-06-05 ENCOUNTER — Ambulatory Visit (INDEPENDENT_AMBULATORY_CARE_PROVIDER_SITE_OTHER): Payer: BC Managed Care – PPO | Admitting: Cardiology

## 2014-06-05 ENCOUNTER — Encounter: Payer: Self-pay | Admitting: Cardiology

## 2014-06-05 VITALS — BP 126/82 | HR 53 | Ht 66.0 in | Wt 241.0 lb

## 2014-06-05 DIAGNOSIS — I25118 Atherosclerotic heart disease of native coronary artery with other forms of angina pectoris: Secondary | ICD-10-CM

## 2014-06-05 DIAGNOSIS — R0602 Shortness of breath: Secondary | ICD-10-CM

## 2014-06-05 DIAGNOSIS — I350 Nonrheumatic aortic (valve) stenosis: Secondary | ICD-10-CM

## 2014-06-05 NOTE — Patient Instructions (Signed)
Your physician has requested that you have an echocardiogram. Echocardiography is a painless test that uses sound waves to create images of your heart. It provides your doctor with information about the size and shape of your heart and how well your heart's chambers and valves are working. This procedure takes approximately one hour. There are no restrictions for this procedure. HAVE THIS FIRST TO BE SURE THAT YOU DO NOT HAVE SEVERE AORTIC STENOSIS  Your physician has requested that you have en exercise stress myoview. For further information please visit [email or web address]. Please follow instruction sheet, as given.  Your physician recommends that you schedule a follow-up appointment in: 2 weeks with Dr Aundra Dubin.

## 2014-06-07 NOTE — Progress Notes (Signed)
Patient ID: Luis [name], male   DOB: 07/02/1955, 59 y.o.   MRN: [id number] PCP: Dr. Sarajane Jews  59 yo with history of CAD and moderate aortic stenosis presents for cardiology followup.  He has seen Dr Rayann Heman and is seen by me for the first time today.  Last Sunday, he was cleaning out a dog house and became markedly tire, lightheaded, short of breath, and diaphoretic.  No chest pain.  Also, for the last few weeks he has noted increased exertional dyspnea => now with moderate work such as Freight forwarder, carrying a load. Legs feel weak but no claudication-type pain.  No chest pain or tightness.  No palpitations or lightheadedness.  His prior anginal equivalent before PCI in 8/12 also was exertional dyspnea.    ECG: NSR at 53  Labs (2/15): K 4.4, creatinine 0.9, LDL 71, HDL 53  PMH: 1. OSA: Uses CPAP 2. CAD: Cardiolite 8/12 with inferior infarct and peri-infarct ischemia, had PCI to the mid and distal RCA with Promus DES x 2.   3. Carotid stenosis: 2/15 carotid dopplers 1-39% bilateral ICA stenosis.  4. Colon polyps 5. HTN 6. Hyperlipidemia 7. Abdominal US (10/12) with no AAA 8. Aortic stenosis: Echo (2/15) with EF 60-65%, mild LVH, moderate diastolic dysfunction, normal RV size/systolic function, moderate aortic stenosis with mean gradient 31 and AVA 1.16 cm2., ascending aorta 4.4 cm.  9. Ascending aorta aneurysm: 4.4 cm on 2/15 echo.   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   ROS: All systems reviewed and negative except as per HPI.   Current Outpatient Prescriptions  Medication Sig Dispense Refill  . aspirin 81 MG tablet Take 81 mg by mouth every morning.     [name] CRESTOR 20 MG tablet TAKE 1 TABLET BY MOUTH AT BEDTIME 30 tablet 6  . diazepam (VALIUM) 5 MG tablet TAKE 1 TABLET BY MOUTH EVERY 6 HOURS AS NEEDED 60 tablet 0  . escitalopram (LEXAPRO) 20 MG tablet TAKE 1 TABLET BY MOUTH DAILY. 30 tablet 11  . escitalopram (LEXAPRO) 20 MG tablet TAKE 1 TABLET BY  MOUTH DAILY. 30 tablet 11  . meclizine (ANTIVERT) 25 MG tablet Take 1 tablet (25 mg total) by mouth 3 (three) times daily as needed. 30 tablet 0  . nitroGLYCERIN (NITROSTAT) 0.4 MG SL tablet Place 1 tablet (0.4 mg total) under the tongue every 5 (five) minutes as needed. For chest pain. 25 tablet 0  . rosuvastatin (CRESTOR) 20 MG tablet TAKE 1 TABLET BY MOUTH AT BEDTIME 30 tablet 11   No current facility-administered medications for this visit.   BP 126/82 mmHg  Pulse 53  Ht 5\' 6"  (1.676 m)  Wt 241 lb (109.317 kg)  BMI 38.92 kg/m2 General: NAD Neck: No JVD, no thyromegaly or thyroid nodule.  Lungs: Clear to auscultation bilaterally with normal respiratory effort. CV: Nondisplaced PMI.  Heart regular S1/S2, no W0/J8, 3/6 systolic crescendo-decrescendo murmur RUSB with somewhat muffled S2.  No peripheral edema.  No carotid bruit.  Normal pedal pulses.  Abdomen: Soft, nontender, no hepatosplenomegaly, no distention.  Skin: Intact without lesions or rashes.  Neurologic: Alert and oriented x 3.  Psych: Normal affect. Extremities: No clubbing or cyanosis.  HEENT: Normal.   Assessment/Plan: 1. CAD: Prior PCI to RCA in 8/12.  Symptoms currently are concerning for recurrent symptomatic CAD.  His prior angina equivalent was exertional dyspnea (which is what he is having currently).  I will arrange for ETT-Cardiolite (do after echo  is done to make sure he does not have severe AS).  Continue ASA 81 and statin.  2. Aortic stenosis: Moderate on last echo.  If AS has progressed to severe, it certainly could be contributing to his current symptoms.  I will arrange for echo prior to Cardiolite.   3. Hyperlipidemia: Needs lipids checked.   [name] 06/07/2014

## 2014-06-10 ENCOUNTER — Ambulatory Visit (HOSPITAL_COMMUNITY): Payer: BC Managed Care – PPO | Attending: Cardiology | Admitting: Radiology

## 2014-06-10 DIAGNOSIS — E785 Hyperlipidemia, unspecified: Secondary | ICD-10-CM | POA: Insufficient documentation

## 2014-06-10 DIAGNOSIS — R0602 Shortness of breath: Secondary | ICD-10-CM | POA: Insufficient documentation

## 2014-06-10 DIAGNOSIS — Z720 Tobacco use: Secondary | ICD-10-CM | POA: Diagnosis not present

## 2014-06-10 DIAGNOSIS — I350 Nonrheumatic aortic (valve) stenosis: Secondary | ICD-10-CM | POA: Diagnosis not present

## 2014-06-10 NOTE — Progress Notes (Signed)
Echocardiogram performed.  

## 2014-06-16 ENCOUNTER — Encounter: Payer: Self-pay | Admitting: Internal Medicine

## 2014-06-16 ENCOUNTER — Encounter: Payer: Self-pay | Admitting: Cardiology

## 2014-06-19 ENCOUNTER — Ambulatory Visit (HOSPITAL_COMMUNITY): Payer: BC Managed Care – PPO | Attending: Cardiology | Admitting: Radiology

## 2014-06-19 DIAGNOSIS — I350 Nonrheumatic aortic (valve) stenosis: Secondary | ICD-10-CM | POA: Diagnosis not present

## 2014-06-19 DIAGNOSIS — R0602 Shortness of breath: Secondary | ICD-10-CM | POA: Insufficient documentation

## 2014-06-19 MED ORDER — REGADENOSON 0.4 MG/5ML IV SOLN
0.4000 mg | Freq: Once | INTRAVENOUS | Status: AC
  Administered 2014-06-19: 0.4 mg via INTRAVENOUS

## 2014-06-19 MED ORDER — TECHNETIUM TC 99M SESTAMIBI GENERIC - CARDIOLITE
11.0000 | Freq: Once | INTRAVENOUS | Status: AC | PRN
  Administered 2014-06-19: 11 via INTRAVENOUS

## 2014-06-19 MED ORDER — TECHNETIUM TC 99M SESTAMIBI GENERIC - CARDIOLITE
33.0000 | Freq: Once | INTRAVENOUS | Status: AC | PRN
Start: 2014-06-19 — End: 2014-06-19
  Administered 2014-06-19: 33 via INTRAVENOUS

## 2014-06-19 NOTE — Progress Notes (Signed)
[facility] [address] [place], [place] [address] [phone]    Cardiology Nuclear Med Study  Luis [name] is a 59 y.o. male     MRN : [id number]     DOB: 09/15/1955  Procedure Date: 06/19/2014  Nuclear Med Background  Indication for Stress Test:  Evaluation for Ischemia History:  CAD, MPI 2012 (ischemia) Cardiac Risk Factors: Carotid Disease  Symptoms:  DOE and SOB   Nuclear Pre-Procedure Caffeine/Decaff Intake:  None NPO After: 8:00am   Lungs:  clear O2 Sat: 96% on room air. IV 0.9% NS with Angio Cath:  22g  IV Site: R Hand  IV Started by:  Crissie Figures, RN  Chest Size (in):  48 Cup Size: n/a  Height: 5\' 6"  (1.676 m)  Weight:  240 lb (108.863 kg)  BMI:  Body mass index is 38.76 kg/(m^2). Tech Comments:  N/A    Nuclear Med Study 1 or 2 day study: 1 day  Stress Test Type:  Lexiscan  Reading MD: N/A  Order Authorizing Provider:  [name], MD  Resting Radionuclide: Technetium 70m Sestamibi  Resting Radionuclide Dose: 11.0 mCi   Stress Radionuclide:  Technetium 33m Sestamibi  Stress Radionuclide Dose: 33.0 mCi           Stress Protocol Rest HR: 51 Stress HR: 80  Rest BP: 133/79 Stress BP: 134/83  Exercise Time (min): n/a METS: n/a   Predicted Max HR: 160 bpm % Max HR: 50 bpm Rate Pressure Product: 11120   Dose of Adenosine (mg):  n/a Dose of Lexiscan: 0.4 mg  Dose of Atropine (mg): n/a Dose of Dobutamine: n/a mcg/kg/min (at max HR)  Stress Test Technologist: Glade Lloyd, BS-ES  Nuclear Technologist:  Earl Many, CNMT     Rest Procedure:  Myocardial perfusion imaging was performed at rest 45 minutes following the intravenous administration of Technetium 39m Sestamibi. Rest ECG: Sinus bradycardia rate 51 with poor R wave progression  Stress Procedure:  The patient received IV Lexiscan 0.4 mg over 15-seconds.  Technetium 90m Sestamibi injected at 30-seconds.  Quantitative spect images were obtained after a 45 minute  delay.  During the infusion of Lexiscan the patient complained of SOB, "feeling funny" and a headache.  These symptoms began to resolve in recovery.  Stress ECG: No significant change from baseline ECG  QPS Raw Data Images:  Normal; no motion artifact; normal heart/lung ratio. Stress Images:  Normal homogeneous uptake in all areas of the myocardium. Rest Images:  Normal homogeneous uptake in all areas of the myocardium. Subtraction (SDS):  No evidence of ischemia. Transient Ischemic Dilatation (Normal <1.22):  0.99 Lung/Heart Ratio (Normal <0.45):  0.34  Quantitative Gated Spect Images QGS EDV:  159 ml QGS ESV:  58 ml  Impression Exercise Capacity:  Lexiscan with no exercise. BP Response:  Normal blood pressure response. Clinical Symptoms:  Typical symptoms with Lexiscan ECG Impression:  No significant ST segment change suggestive of ischemia. Comparison with Prior Nuclear Study: No images to compare  Overall Impression:  Low risk stress nuclear study with no evidence of ischemia identified.  LV Ejection Fraction: 64%.  LV Wall Motion:  NL LV Function; NL Wall Motion  Candee Furbish, MD

## 2014-06-20 ENCOUNTER — Encounter: Payer: Self-pay | Admitting: *Deleted

## 2014-06-20 ENCOUNTER — Ambulatory Visit (INDEPENDENT_AMBULATORY_CARE_PROVIDER_SITE_OTHER): Payer: BC Managed Care – PPO | Admitting: Cardiology

## 2014-06-20 ENCOUNTER — Encounter: Payer: Self-pay | Admitting: Cardiology

## 2014-06-20 VITALS — BP 118/72 | HR 66 | Ht 66.0 in | Wt 241.6 lb

## 2014-06-20 DIAGNOSIS — I25118 Atherosclerotic heart disease of native coronary artery with other forms of angina pectoris: Secondary | ICD-10-CM

## 2014-06-20 DIAGNOSIS — R0602 Shortness of breath: Secondary | ICD-10-CM

## 2014-06-20 DIAGNOSIS — E785 Hyperlipidemia, unspecified: Secondary | ICD-10-CM

## 2014-06-20 DIAGNOSIS — I350 Nonrheumatic aortic (valve) stenosis: Secondary | ICD-10-CM

## 2014-06-20 LAB — CBC WITH DIFFERENTIAL/PLATELET
Basophils Absolute: 0 10*3/uL (ref 0.0–0.1)
Basophils Relative: 0.4 % (ref 0.0–3.0)
EOS ABS: 0.3 10*3/uL (ref 0.0–0.7)
Eosinophils Relative: 4.5 % (ref 0.0–5.0)
HEMATOCRIT: 41.3 % (ref 39.0–52.0)
Hemoglobin: 14.1 g/dL (ref 13.0–17.0)
Lymphocytes Relative: 24.1 % (ref 12.0–46.0)
Lymphs Abs: 1.7 10*3/uL (ref 0.7–4.0)
MCHC: 34.2 g/dL (ref 30.0–36.0)
MCV: 86.2 fl (ref 78.0–100.0)
MONO ABS: 0.6 10*3/uL (ref 0.1–1.0)
Monocytes Relative: 8.5 % (ref 3.0–12.0)
NEUTROS PCT: 62.5 % (ref 43.0–77.0)
Neutro Abs: 4.5 10*3/uL (ref 1.4–7.7)
Platelets: 171 10*3/uL (ref 150.0–400.0)
RBC: 4.8 Mil/uL (ref 4.22–5.81)
RDW: 13.9 % (ref 11.5–15.5)
WBC: 7.1 10*3/uL (ref 4.0–10.5)

## 2014-06-20 LAB — LIPID PANEL
Cholesterol: 154 mg/dL (ref 0–200)
HDL: 57.1 mg/dL (ref >39.00)
LDL Cholesterol: 62 mg/dL (ref 0–99)
NONHDL: 96.9
Total CHOL/HDL Ratio: 3
Triglycerides: 177 mg/dL — ABNORMAL HIGH (ref 0.0–149.0)
VLDL: 35.4 mg/dL (ref 0.0–40.0)

## 2014-06-20 LAB — PROTIME-INR
INR: 1 ratio (ref 0.8–1.0)
PROTHROMBIN TIME: 10.6 s (ref 9.6–13.1)

## 2014-06-20 LAB — HEPATIC FUNCTION PANEL
ALBUMIN: 4.2 g/dL (ref 3.5–5.2)
ALT: 21 U/L (ref 0–53)
AST: 16 U/L (ref 0–37)
Alkaline Phosphatase: 81 U/L (ref 39–117)
BILIRUBIN TOTAL: 0.9 mg/dL (ref 0.2–1.2)
Bilirubin, Direct: 0.2 mg/dL (ref 0.0–0.3)
Total Protein: 7.2 g/dL (ref 6.0–8.3)

## 2014-06-20 LAB — BASIC METABOLIC PANEL
BUN: 17 mg/dL (ref 6–23)
CHLORIDE: 104 meq/L (ref 96–112)
CO2: 31 meq/L (ref 19–32)
Calcium: 9.1 mg/dL (ref 8.4–10.5)
Creatinine, Ser: 1 mg/dL (ref 0.40–1.50)
GFR: 80.9 mL/min (ref >60.00)
Glucose, Bld: 103 mg/dL — ABNORMAL HIGH (ref 70–99)
POTASSIUM: 4.8 meq/L (ref 3.5–5.1)
Sodium: 138 mEq/L (ref 135–145)

## 2014-06-20 NOTE — Patient Instructions (Signed)
Your physician has requested that you have a cardiac catheterization. Cardiac catheterization is used to diagnose and/or treat various heart conditions. Doctors may recommend this procedure for a number of different reasons. The most common reason is to evaluate chest pain. Chest pain can be a symptom of coronary artery disease (CAD), and cardiac catheterization can show whether plaque is narrowing or blocking your heart's arteries. This procedure is also used to evaluate the valves, as well as measure the blood flow and oxygen levels in different parts of your heart. For further information please visit [email or web address]. Please follow instruction sheet, as given.  Your physician has requested that you have a TEE. During a TEE, sound waves are used to create images of your heart. It provides your doctor with information about the size and shape of your heart and how well your heart's chambers and valves are working. In this test, a transducer is attached to the end of a flexible tube that's guided down your throat and into your esophagus (the tube leading from you mouth to your stomach) to get a more detailed image of your heart. You are not awake for the procedure. Please see the instruction sheet given to you today. For further information please visit [email or web address].  Your physician recommends that you have lab work today: lipid/liver/ bmp/cbc/inr  Your physician recommends that you schedule a follow-up appointment in: 2-3 weeks.

## 2014-06-22 NOTE — Progress Notes (Signed)
Patient ID: Luis [name], male   DOB: April 05, 1956, 59 y.o.   MRN: [id number] PCP: Dr. Sarajane Jews  59 yo with history of CAD and moderate aortic stenosis presents for cardiology followup.  For the last few weeks he has noted increased exertional dyspnea => now with walking 30-40 yards, bending over, lifting and carrying modest loads.  This has been steadily worsening.  It feels like symptoms before he had stents in 8/12.  No chest pain or tightness.  No palpitations or lightheadedness.  Weight is stable.   I had him do a Lexiscan Cardiolite in 2/16.  This showed no ischemia or infarction.  I also had him get an echo that showed ?bicuspid aortic valve and probably moderate aortic stenosis.   Labs (2/15): K 4.4, creatinine 0.9, LDL 71, HDL 53  PMH: 1. OSA: Uses CPAP 2. CAD: Cardiolite 8/12 with inferior infarct and peri-infarct ischemia, had PCI to the mid and distal RCA with Promus DES x 2.  Lexiscan Cardiolite (2/16) with no ischemia or infarction.  3. Carotid stenosis: 2/15 carotid dopplers 1-39% bilateral ICA stenosis.  4. Colon polyps 5. HTN 6. Hyperlipidemia 7. Abdominal US (10/12) with no AAA 8. Aortic stenosis: Echo (2/15) with EF 60-65%, mild LVH, moderate diastolic dysfunction, normal RV size/systolic function, moderate aortic stenosis with mean gradient 31 and AVA 1.16 cm2., ascending aorta 4.4 cm.  Echo (2/16) with EF 60-65%, grade II diastolic dysfunction, ?bicuspid aortic valve with moderate AS (mean gradient 35 mmHg, AVA 1.5 cm^2), mildly dilated RV with normal systolic function.  9. Ascending aorta aneurysm: 4.4 cm on 2/15 echo.   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   ROS: All systems reviewed and negative except as per HPI.   Current Outpatient Prescriptions  Medication Sig Dispense Refill  . aspirin 81 MG tablet Take 81 mg by mouth every morning.     [name] CRESTOR 20 MG tablet TAKE 1 TABLET BY MOUTH AT BEDTIME 30 tablet 6  . diazepam  (VALIUM) 5 MG tablet TAKE 1 TABLET BY MOUTH EVERY 6 HOURS AS NEEDED 60 tablet 0  . escitalopram (LEXAPRO) 20 MG tablet TAKE 1 TABLET BY MOUTH DAILY. 30 tablet 11  . meclizine (ANTIVERT) 25 MG tablet Take 1 tablet (25 mg total) by mouth 3 (three) times daily as needed. 30 tablet 0  . nitroGLYCERIN (NITROSTAT) 0.4 MG SL tablet Place 1 tablet (0.4 mg total) under the tongue every 5 (five) minutes as needed. For chest pain. 25 tablet 0   No current facility-administered medications for this visit.   BP 118/72 mmHg  Pulse 66  Ht 5\' 6"  (1.676 m)  Wt 241 lb 9.6 oz (109.589 kg)  BMI 39.01 kg/m2 General: NAD Neck: No JVD, no thyromegaly or thyroid nodule.  Lungs: Clear to auscultation bilaterally with normal respiratory effort. CV: Nondisplaced PMI.  Heart regular S1/S2, no M0/Q6, 3/6 systolic crescendo-decrescendo murmur RUSB with somewhat muffled S2.  No peripheral edema.  No carotid bruit.  Normal pedal pulses.  Abdomen: Soft, nontender, no hepatosplenomegaly, no distention.  Skin: Intact without lesions or rashes.  Neurologic: Alert and oriented x 3.  Psych: Normal affect. Extremities: No clubbing or cyanosis.  HEENT: Normal.   Assessment/Plan: 1. CAD: Prior PCI to RCA in 8/12.  Symptoms currently are concerning for recurrent symptomatic CAD.  His prior angina equivalent was exertional dyspnea (which is what he is having currently).  Cardiolite done after last appointment showed no ischemia or  infarction, but his symptoms continue and are worsening.  I am concerned that his Cardiolite could be a false positive.  I am going to set him up for right and left heart catheterization.  2. Aortic stenosis:  Mean gradient on last echo in 2/16 suggested that AS is still moderate (35 mmHg).  However, I question whether the TTE obtained the true gradient across the aortic valve, as his symptoms certainly could also be due to symptomatic severe AS.  Also, there is a question of possible bicuspid aortic valve.   Therefore, I will arrange for TEE to more closely assess the aortic valve.  3. Hyperlipidemia: Needs lipids checked.  4. Ascending aortic aneurysm: Will reassess by TEE.  This can be associated with bicuspid aortic valve.   [name] 06/22/2014

## 2014-06-23 ENCOUNTER — Ambulatory Visit (HOSPITAL_COMMUNITY)
Admission: RE | Admit: 2014-06-23 | Discharge: 2014-06-23 | Disposition: A | Discharge status: Home / Self Care | Payer: BC Managed Care – PPO | Source: Ambulatory Visit | Attending: Cardiology | Admitting: Cardiology

## 2014-06-23 ENCOUNTER — Encounter (HOSPITAL_COMMUNITY)
Admission: RE | Disposition: A | Discharge status: Home / Self Care | Payer: Self-pay | Source: Ambulatory Visit | Attending: Cardiology

## 2014-06-23 ENCOUNTER — Encounter (HOSPITAL_COMMUNITY): Payer: Self-pay | Admitting: *Deleted

## 2014-06-23 DIAGNOSIS — I714 Abdominal aortic aneurysm, without rupture: Secondary | ICD-10-CM | POA: Diagnosis not present

## 2014-06-23 DIAGNOSIS — I10 Essential (primary) hypertension: Secondary | ICD-10-CM | POA: Diagnosis not present

## 2014-06-23 DIAGNOSIS — Q231 Congenital insufficiency of aortic valve: Secondary | ICD-10-CM | POA: Diagnosis not present

## 2014-06-23 DIAGNOSIS — I2510 Atherosclerotic heart disease of native coronary artery without angina pectoris: Secondary | ICD-10-CM | POA: Diagnosis not present

## 2014-06-23 DIAGNOSIS — Z87891 Personal history of nicotine dependence: Secondary | ICD-10-CM | POA: Diagnosis not present

## 2014-06-23 DIAGNOSIS — G4733 Obstructive sleep apnea (adult) (pediatric): Secondary | ICD-10-CM | POA: Diagnosis not present

## 2014-06-23 DIAGNOSIS — I350 Nonrheumatic aortic (valve) stenosis: Secondary | ICD-10-CM

## 2014-06-23 DIAGNOSIS — I6523 Occlusion and stenosis of bilateral carotid arteries: Secondary | ICD-10-CM | POA: Diagnosis not present

## 2014-06-23 DIAGNOSIS — Z7982 Long term (current) use of aspirin: Secondary | ICD-10-CM | POA: Insufficient documentation

## 2014-06-23 DIAGNOSIS — I252 Old myocardial infarction: Secondary | ICD-10-CM | POA: Insufficient documentation

## 2014-06-23 DIAGNOSIS — E785 Hyperlipidemia, unspecified: Secondary | ICD-10-CM | POA: Diagnosis not present

## 2014-06-23 DIAGNOSIS — R0609 Other forms of dyspnea: Secondary | ICD-10-CM | POA: Diagnosis present

## 2014-06-23 DIAGNOSIS — Q230 Congenital stenosis of aortic valve: Secondary | ICD-10-CM | POA: Insufficient documentation

## 2014-06-23 DIAGNOSIS — Z955 Presence of coronary angioplasty implant and graft: Secondary | ICD-10-CM | POA: Diagnosis not present

## 2014-06-23 HISTORY — PX: TEE WITHOUT CARDIOVERSION: SHX5443

## 2014-06-23 HISTORY — PX: LEFT AND RIGHT HEART CATHETERIZATION WITH CORONARY ANGIOGRAM: SHX5449

## 2014-06-23 SURGERY — LEFT AND RIGHT HEART CATHETERIZATION WITH CORONARY ANGIOGRAM
Anesthesia: LOCAL

## 2014-06-23 SURGERY — ECHOCARDIOGRAM, TRANSESOPHAGEAL
Anesthesia: Moderate Sedation

## 2014-06-23 MED ORDER — SODIUM CHLORIDE 0.9 % IV SOLN: INTRAVENOUS | Status: DC

## 2014-06-23 MED ORDER — SODIUM CHLORIDE 0.9 % IV SOLN: 250.0000 mL | INTRAVENOUS | Status: DC | PRN

## 2014-06-23 MED ORDER — BUTAMBEN-TETRACAINE-BENZOCAINE 2-2-14 % EX AERO
INHALATION_SPRAY | CUTANEOUS | Status: DC | PRN
  Administered 2014-06-23: 2 via TOPICAL

## 2014-06-23 MED ORDER — MIDAZOLAM HCL 2 MG/2ML IJ SOLN
INTRAMUSCULAR | Status: AC
  Filled 2014-06-23: qty 2

## 2014-06-23 MED ORDER — NITROGLYCERIN 1 MG/10 ML FOR IR/CATH LAB
INTRA_ARTERIAL | Status: AC
  Filled 2014-06-23: qty 10

## 2014-06-23 MED ORDER — SODIUM CHLORIDE 0.9 % IV SOLN
INTRAVENOUS | Status: DC
  Administered 2014-06-23: 500 mL via INTRAVENOUS

## 2014-06-23 MED ORDER — HEPARIN SODIUM (PORCINE) 1000 UNIT/ML IJ SOLN
INTRAMUSCULAR | Status: AC
  Filled 2014-06-23: qty 1

## 2014-06-23 MED ORDER — SODIUM CHLORIDE 0.9 % IJ SOLN: 3.0000 mL | INTRAMUSCULAR | Status: DC | PRN

## 2014-06-23 MED ORDER — SODIUM CHLORIDE 0.9 % IJ SOLN: 3.0000 mL | Freq: Two times a day (BID) | INTRAMUSCULAR | Status: DC

## 2014-06-23 MED ORDER — FENTANYL CITRATE 0.05 MG/ML IJ SOLN
INTRAMUSCULAR | Status: DC | PRN
  Administered 2014-06-23 (×3): 25 ug via INTRAVENOUS

## 2014-06-23 MED ORDER — FENTANYL CITRATE 0.05 MG/ML IJ SOLN
INTRAMUSCULAR | Status: AC
  Filled 2014-06-23: qty 2

## 2014-06-23 MED ORDER — VERAPAMIL HCL 2.5 MG/ML IV SOLN
INTRAVENOUS | Status: AC
  Filled 2014-06-23: qty 2

## 2014-06-23 MED ORDER — DIPHENHYDRAMINE HCL 50 MG/ML IJ SOLN
INTRAMUSCULAR | Status: AC
  Filled 2014-06-23: qty 1

## 2014-06-23 MED ORDER — ACETAMINOPHEN 325 MG PO TABS: 650.0000 mg | ORAL_TABLET | ORAL | Status: DC | PRN

## 2014-06-23 MED ORDER — HEPARIN (PORCINE) IN NACL 2-0.9 UNIT/ML-% IJ SOLN
INTRAMUSCULAR | Status: AC
  Filled 2014-06-23: qty 1000

## 2014-06-23 MED ORDER — LIDOCAINE HCL (PF) 1 % IJ SOLN
INTRAMUSCULAR | Status: AC
  Filled 2014-06-23: qty 30

## 2014-06-23 MED ORDER — ASPIRIN 81 MG PO CHEW
81.0000 mg | CHEWABLE_TABLET | ORAL | Status: DC
  Filled 2014-06-23: qty 1

## 2014-06-23 MED ORDER — ONDANSETRON HCL 4 MG/2ML IJ SOLN: 4.0000 mg | Freq: Four times a day (QID) | INTRAMUSCULAR | Status: DC | PRN

## 2014-06-23 MED ORDER — MIDAZOLAM HCL 10 MG/2ML IJ SOLN
INTRAMUSCULAR | Status: DC | PRN
  Administered 2014-06-23 (×3): 2 mg via INTRAVENOUS

## 2014-06-23 MED ORDER — MIDAZOLAM HCL 5 MG/ML IJ SOLN
INTRAMUSCULAR | Status: AC
  Filled 2014-06-23: qty 2

## 2014-06-23 NOTE — Interval H&P Note (Signed)
Cath Lab Visit (complete for each Cath Lab visit)  Clinical Evaluation Leading to the Procedure:   ACS: No.  Non-ACS:    Anginal Classification: CCS III  Anti-ischemic medical therapy: Minimal Therapy (1 class of medications)  Non-Invasive Test Results: Low-risk stress test findings: cardiac mortality <1%/year  Prior CABG: No previous CABG      History and Physical Interval Note:  06/23/2014 11:52 AM  Luis [name]  has presented today for surgery, with the diagnosis of excertional sob, aortic stenosis  The various methods of treatment have been discussed with the patient and family. After consideration of risks, benefits and other options for treatment, the patient has consented to  Procedure(s): LEFT AND RIGHT HEART CATHETERIZATION WITH CORONARY ANGIOGRAM (N/A) as a surgical intervention .  The patient's history has been reviewed, patient examined, no change in status, stable for surgery.  I have reviewed the patient's chart and labs.  Questions were answered to the patient's satisfaction.     Bill Mcvey [employer]

## 2014-06-23 NOTE — Interval H&P Note (Signed)
History and Physical Interval Note:  06/23/2014 8:08 AM  Luis [name]  has presented today for surgery, with the diagnosis of aortic stenosis  The various methods of treatment have been discussed with the patient and family. After consideration of risks, benefits and other options for treatment, the patient has consented to  Procedure(s): TRANSESOPHAGEAL ECHOCARDIOGRAM (TEE) (N/A) as a surgical intervention .  The patient's history has been reviewed, patient examined, no change in status, stable for surgery.  I have reviewed the patient's chart and labs.  Questions were answered to the patient's satisfaction.     Jeffree Cazeau [employer]

## 2014-06-23 NOTE — CV Procedure (Signed)
Procedure: TEE  Indication: Aortic stenosis.  Sedation: Versed 6 mg IV, Fentanyl 75 mcg IV  Findings: Please see echo section for full report.  Normal LV size with mild LV hypertrophy.  EF 55-60%.  Mildly dilated RV with normal systolic function.  The aortic valve was bicuspid with moderate calcification and moderate aortic stenosis, mean gradient 30 mmHg with AVA > 1 cm.  Mild AI.  Mild MR.  Negative bubble study.  The ascending aorta was dilated to 4.2 cm.    Impression: Moderate AS with bicuspid aortic valve, mildly dilated ascending aorta.   Luis [name] 06/23/2014 8:52 AM

## 2014-06-23 NOTE — Discharge Instructions (Signed)
Radial Site Care °Refer to this sheet in the next few weeks. These instructions provide you with information on caring for yourself after your procedure. Your caregiver may also give you more specific instructions. Your treatment has been planned according to current medical practices, but problems sometimes occur. Call your caregiver if you have any problems or questions after your procedure. °HOME CARE INSTRUCTIONS °· You may shower the day after the procedure. Remove the bandage (dressing) and gently wash the site with plain soap and water. Gently pat the site dry. °· Do not apply powder or lotion to the site. °· Do not submerge the affected site in water for 3 to 5 days. °· Inspect the site at least twice daily. °· Do not flex or bend the affected arm for 24 hours. °· No lifting over 5 pounds (2.3 kg) for 5 days after your procedure. °· Do not drive home if you are discharged the same day of the procedure. Have someone else drive you. °· You may drive 24 hours after the procedure unless otherwise instructed by your caregiver. °· Do not operate machinery or power tools for 24 hours. °· A responsible adult should be with you for the first 24 hours after you arrive home. °What to expect: °· Any bruising will usually fade within 1 to 2 weeks. °· Blood that collects in the tissue (hematoma) may be painful to the touch. It should usually decrease in size and tenderness within 1 to 2 weeks. °SEEK IMMEDIATE MEDICAL CARE IF: °· You have unusual pain at the radial site. °· You have redness, warmth, swelling, or pain at the radial site. °· You have drainage (other than a small amount of blood on the dressing). °· You have chills. °· You have a fever or persistent symptoms for more than 72 hours. °· You have a fever and your symptoms suddenly get worse. °· Your arm becomes pale, cool, tingly, or numb. °· You have heavy bleeding from the site. Hold pressure on the site. °Document Released: 05/14/2010 Document Revised:  07/04/2011 Document Reviewed: 05/14/2010 °ExitCare® Patient Information ©2015 [employer]. This information is not intended to replace advice given to you by your health care provider. Make sure you discuss any questions you have with your health care provider. ° °

## 2014-06-23 NOTE — H&P (View-Only) (Signed)
Patient ID: DRAGON THRUSH, male   DOB: [birth year], 59 y.o.   MRN: [id number] PCP: Dr. Sarajane Jews  59 yo with history of CAD and moderate aortic stenosis presents for cardiology followup.  For the last few weeks he has noted increased exertional dyspnea => now with walking 30-40 yards, bending over, lifting and carrying modest loads.  This has been steadily worsening.  It feels like symptoms before he had stents in 8/12.  No chest pain or tightness.  No palpitations or lightheadedness.  Weight is stable.   I had him do a Lexiscan Cardiolite in 2/16.  This showed no ischemia or infarction.  I also had him get an echo that showed ?bicuspid aortic valve and probably moderate aortic stenosis.   Labs (2/15): K 4.4, creatinine 0.9, LDL 71, HDL 53  PMH: 1. OSA: Uses CPAP 2. CAD: Cardiolite 8/12 with inferior infarct and peri-infarct ischemia, had PCI to the mid and distal RCA with Promus DES x 2.  Lexiscan Cardiolite (2/16) with no ischemia or infarction.  3. Carotid stenosis: 2/15 carotid dopplers 1-39% bilateral ICA stenosis.  4. Colon polyps 5. HTN 6. Hyperlipidemia 7. Abdominal US (10/12) with no AAA 8. Aortic stenosis: Echo (2/15) with EF 60-65%, mild LVH, moderate diastolic dysfunction, normal RV size/systolic function, moderate aortic stenosis with mean gradient 31 and AVA 1.16 cm2., ascending aorta 4.4 cm.  Echo (2/16) with EF 60-65%, grade II diastolic dysfunction, ?bicuspid aortic valve with moderate AS (mean gradient 35 mmHg, AVA 1.5 cm^2), mildly dilated RV with normal systolic function.  9. Ascending aorta aneurysm: 4.4 cm on 2/15 echo.   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   ROS: All systems reviewed and negative except as per HPI.   Current Outpatient Prescriptions  Medication Sig Dispense Refill  . aspirin 81 MG tablet Take 81 mg by mouth every morning.     [name] CRESTOR 20 MG tablet TAKE 1 TABLET BY MOUTH AT BEDTIME 30 tablet 6  . diazepam  (VALIUM) 5 MG tablet TAKE 1 TABLET BY MOUTH EVERY 6 HOURS AS NEEDED 60 tablet 0  . escitalopram (LEXAPRO) 20 MG tablet TAKE 1 TABLET BY MOUTH DAILY. 30 tablet 11  . meclizine (ANTIVERT) 25 MG tablet Take 1 tablet (25 mg total) by mouth 3 (three) times daily as needed. 30 tablet 0  . nitroGLYCERIN (NITROSTAT) 0.4 MG SL tablet Place 1 tablet (0.4 mg total) under the tongue every 5 (five) minutes as needed. For chest pain. 25 tablet 0   No current facility-administered medications for this visit.   BP 118/72 mmHg  Pulse 66  Ht 5\' 6"  (1.676 m)  Wt 241 lb 9.6 oz (109.589 kg)  BMI 39.01 kg/m2 General: NAD Neck: No JVD, no thyromegaly or thyroid nodule.  Lungs: Clear to auscultation bilaterally with normal respiratory effort. CV: Nondisplaced PMI.  Heart regular S1/S2, no J0/K9, 3/6 systolic crescendo-decrescendo murmur RUSB with somewhat muffled S2.  No peripheral edema.  No carotid bruit.  Normal pedal pulses.  Abdomen: Soft, nontender, no hepatosplenomegaly, no distention.  Skin: Intact without lesions or rashes.  Neurologic: Alert and oriented x 3.  Psych: Normal affect. Extremities: No clubbing or cyanosis.  HEENT: Normal.   Assessment/Plan: 1. CAD: Prior PCI to RCA in 8/12.  Symptoms currently are concerning for recurrent symptomatic CAD.  His prior angina equivalent was exertional dyspnea (which is what he is having currently).  Cardiolite done after last appointment showed no ischemia or  infarction, but his symptoms continue and are worsening.  I am concerned that his Cardiolite could be a false positive.  I am going to set him up for right and left heart catheterization.  2. Aortic stenosis:  Mean gradient on last echo in 2/16 suggested that AS is still moderate (35 mmHg).  However, I question whether the TTE obtained the true gradient across the aortic valve, as his symptoms certainly could also be due to symptomatic severe AS.  Also, there is a question of possible bicuspid aortic valve.   Therefore, I will arrange for TEE to more closely assess the aortic valve.  3. Hyperlipidemia: Needs lipids checked.  4. Ascending aortic aneurysm: Will reassess by TEE.  This can be associated with bicuspid aortic valve.   [name] 06/22/2014

## 2014-06-23 NOTE — Progress Notes (Signed)
  Echocardiogram Echocardiogram Transesophageal has been performed.  Luis [name] 06/23/2014, 9:13 AM

## 2014-06-23 NOTE — CV Procedure (Signed)
    Cardiac Catheterization Procedure Note  Name: Luis [name] MRN: [id number] DOB: 1956/01/22  Procedure: Right Heart Cath, Selective Coronary Angiography  Indication: Exertional dyspnea, progressive.    Procedural Details: The right radial and brachial areas were prepped, draped, and anesthetized with 1% lidocaine. There was a pre-existing peripheral IV in the right brachial area that was replaced with a 58F venous sheath. A Swan-Ganz catheter was used for the right heart catheterization. Standard protocol was followed for recording of right heart pressures and sampling of oxygen saturations. Fick cardiac output was calculated.  The right radial artery was entered using modified Seldinger technique and a 77F arterial sheath was placed.  Standard Judkins catheters were used for selective coronary angiography. There were no immediate procedural complications. The patient was transferred to the post catheterization recovery area for further monitoring.  Procedural Findings: Hemodynamics (mmHg) RA mean 12 RV 31/12 PA 32/15, mean 23 PCWP mean 18 AO 134/77  Oxygen saturations: PA 76% AO 96%  Cardiac Output (Fick) 7.53  Cardiac Index (Fick) 3.47   Coronary angiography: Coronary dominance: right  Left mainstem: Short, no significant disease.    Left anterior descending (LAD): Luminal irregularities in the LAD.   Left circumflex (LCx): Moderate ramus looks ok.  Luminals in LCx.   Right coronary artery (RCA): Patent mid and distal RCA stents.  50-60% stenosis proximally in a relatively small PDA.   Left ventriculography: Left ventricular systolic function is normal, LVEF is estimated at 55-65%, there is no significant mitral regurgitation   Final Conclusions:  Patent RCA stents, moderate stenosis in the proximal PDA.  This is a relatively small vessel.  Mildly elevated left and right heart filling pressures.    Recommendations: I will add Imdur 30 mg daily and Lasix 20 mg daily +  KCl 10 daily to his regimen.  Will arrange PFTs as well.   [name] 06/23/2014, 12:37 PM

## 2014-06-24 ENCOUNTER — Encounter (HOSPITAL_COMMUNITY): Payer: Self-pay | Admitting: Cardiology

## 2014-06-24 LAB — POCT I-STAT 3, VENOUS BLOOD GAS (G3P V)
ACID-BASE DEFICIT: 1 mmol/L (ref 0.0–2.0)
BICARBONATE: 24 meq/L (ref 20.0–24.0)
O2 SAT: 76 %
TCO2: 25 mmol/L (ref 0–100)
pCO2, Ven: 41.2 mmHg — ABNORMAL LOW (ref 45.0–50.0)
pH, Ven: 7.373 — ABNORMAL HIGH (ref 7.250–7.300)
pO2, Ven: 42 mmHg (ref 30.0–45.0)

## 2014-06-24 LAB — POCT I-STAT 3, ART BLOOD GAS (G3+)
Acid-base deficit: 1 mmol/L (ref 0.0–2.0)
Bicarbonate: 24.9 mEq/L — ABNORMAL HIGH (ref 20.0–24.0)
O2 Saturation: 96 %
PCO2 ART: 44.6 mmHg (ref 35.0–45.0)
PH ART: 7.355 (ref 7.350–7.450)
PO2 ART: 86 mmHg (ref 80.0–100.0)
TCO2: 26 mmol/L (ref 0–100)

## 2014-06-26 ENCOUNTER — Encounter (HOSPITAL_COMMUNITY): Payer: BC Managed Care – PPO

## 2014-06-30 ENCOUNTER — Encounter: Payer: Self-pay | Admitting: Cardiology

## 2014-06-30 ENCOUNTER — Ambulatory Visit: Payer: BC Managed Care – PPO | Admitting: Cardiology

## 2014-06-30 ENCOUNTER — Ambulatory Visit (INDEPENDENT_AMBULATORY_CARE_PROVIDER_SITE_OTHER): Payer: BC Managed Care – PPO | Admitting: Cardiology

## 2014-06-30 VITALS — BP 122/66 | HR 59 | Ht 66.0 in | Wt 241.0 lb

## 2014-06-30 DIAGNOSIS — I350 Nonrheumatic aortic (valve) stenosis: Secondary | ICD-10-CM

## 2014-06-30 DIAGNOSIS — I2510 Atherosclerotic heart disease of native coronary artery without angina pectoris: Secondary | ICD-10-CM

## 2014-06-30 DIAGNOSIS — I5032 Chronic diastolic (congestive) heart failure: Secondary | ICD-10-CM

## 2014-06-30 DIAGNOSIS — R0602 Shortness of breath: Secondary | ICD-10-CM

## 2014-06-30 LAB — BASIC METABOLIC PANEL
BUN: 18 mg/dL (ref 6–23)
CHLORIDE: 103 meq/L (ref 96–112)
CO2: 31 meq/L (ref 19–32)
Calcium: 8.7 mg/dL (ref 8.4–10.5)
Creatinine, Ser: 1.08 mg/dL (ref 0.40–1.50)
GFR: 74.02 mL/min (ref >60.00)
Glucose, Bld: 105 mg/dL — ABNORMAL HIGH (ref 70–99)
POTASSIUM: 4.2 meq/L (ref 3.5–5.1)
Sodium: 138 mEq/L (ref 135–145)

## 2014-06-30 LAB — BRAIN NATRIURETIC PEPTIDE: Pro B Natriuretic peptide (BNP): 19 pg/mL (ref 0.0–100.0)

## 2014-06-30 MED ORDER — FUROSEMIDE 40 MG PO TABS: 40.0000 mg | ORAL_TABLET | Freq: Every day | ORAL | Status: DC

## 2014-06-30 NOTE — Progress Notes (Signed)
Patient ID: Luis [name], male   DOB: 1956/01/20, 59 y.o.   MRN: [id number] PCP: Dr. Sarajane Jews  59 yo with history of CAD and moderate aortic stenosis presents for cardiology followup. Recently, he has noted increased exertional dyspnea => now with walking 30-40 yards, bending over, lifting and carrying modest loads.  This seems to have gradually worsened  No chest pain or tightness.  No palpitations or lightheadedness.    I had him do a Lexiscan Cardiolite in 2/16.  This showed no ischemia or infarction.  I also had him get an echo that showed ?bicuspid aortic valve and probably moderate aortic stenosis.  Given ongoing symptoms, I had him do a RHC/LHC and TEE in 2/16.  The RHC showed mildly elevated left and right heart filling pressures and the LHC showed nonobstructive CAD.  TEE confirmed bicuspid aortic valve with moderate AS.  I started him on Imdur for ?microvascular disease and Lasix 20 mg daily.    The Imdur is giving him a headache.  Symptomatically, he is no worse and no better.  He has had a hard week; his step-father died fairly suddenly.    Labs 06-29-22): K 4.4, creatinine 0.9, LDL 71, HDL 53 Labs (2/16): K 4.8, creatinine 1.0, LDL 62, HDL 57  ECG: NSR, poor RWP  PMH: 1. OSA: Uses CPAP 2. CAD: Cardiolite 8/12 with inferior infarct and peri-infarct ischemia, had PCI to the mid and distal RCA with Promus DES x 2.  Lexiscan Cardiolite (2/16) with no ischemia or infarction. LHC/RHC (2/16) with mean RA 12, PA 32/15, mean PCWP 18, CI 3.47; patent mid and distal RCA stents, 50-60% proximal stenosis small PDA.   3. Carotid stenosis: June 29, 2022 carotid dopplers 1-39% bilateral ICA stenosis.  4. Colon polyps 5. HTN 6. Hyperlipidemia 7. Abdominal US (10/12) with no AAA 8. Aortic stenosis: Echo 29-Jun-2022) with EF 60-65%, mild LVH, moderate diastolic dysfunction, normal RV size/systolic function, moderate aortic stenosis with mean gradient 31 and AVA 1.16 cm2, ascending aorta 4.4 cm.  Echo (2/16) with EF  60-65%, grade II diastolic dysfunction, ?bicuspid aortic valve with moderate AS (mean gradient 35 mmHg, AVA 1.5 cm^2), mildly dilated RV with normal systolic function.  TEE (2/16) with bicuspid aortic valve, moderate AS mean gradient 30 mmHg, AVA > 1 cm^2, EF 55-60%, mild LVH, mildly dilated RV with normal systolic function, mild AI, mild MR, ascending aorta 4.2 cm.  9. Ascending aorta aneurysm: 4.4 cm on 06/29/2022 echo. Ascending aorta 4.2 cm TEE 2/16.  10. Chronic diastolic CHF.   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   ROS: All systems reviewed and negative except as per HPI.   Current Outpatient Prescriptions  Medication Sig Dispense Refill  . aspirin 81 MG tablet Take 81 mg by mouth every morning.     [name] CRESTOR 20 MG tablet TAKE 1 TABLET BY MOUTH AT BEDTIME 30 tablet 6  . diazepam (VALIUM) 5 MG tablet TAKE 1 TABLET BY MOUTH EVERY 6 HOURS AS NEEDED 60 tablet 0  . escitalopram (LEXAPRO) 20 MG tablet TAKE 1 TABLET BY MOUTH DAILY. 30 tablet 11  . isosorbide mononitrate (IMDUR) 30 MG 24 hr tablet Take 30 mg by mouth daily.  6  . KLOR-CON 10 10 MEQ tablet Take 10 mEq by mouth daily.  6  . meclizine (ANTIVERT) 25 MG tablet Take 1 tablet (25 mg total) by mouth 3 (three) times daily as needed. 30 tablet 0  . nitroGLYCERIN (NITROSTAT)  0.4 MG SL tablet Place 1 tablet (0.4 mg total) under the tongue every 5 (five) minutes as needed. For chest pain. 25 tablet 0  . furosemide (LASIX) 40 MG tablet Take 1 tablet (40 mg total) by mouth daily. 90 tablet 3   No current facility-administered medications for this visit.   BP 122/66 mmHg  Pulse 59  Ht 5\' 6"  (1.676 m)  Wt 241 lb (109.317 kg)  BMI 38.92 kg/m2 General: NAD Neck: No JVD, no thyromegaly or thyroid nodule.  Lungs: Clear to auscultation bilaterally with normal respiratory effort. CV: Nondisplaced PMI.  Heart regular S1/S2, no F1/M3, 3/6 systolic crescendo-decrescendo murmur RUSB with somewhat muffled S2.   No peripheral edema.  No carotid bruit.  Normal pedal pulses.  Abdomen: Soft, nontender, no hepatosplenomegaly, no distention.  Skin: Intact without lesions or rashes.  Neurologic: Alert and oriented x 3.  Psych: Normal affect. Extremities: No clubbing or cyanosis.  HEENT: Normal.   Assessment/Plan: 1. CAD: Prior PCI to RCA in 8/12.  LHC (2/16) with nonobstructive disease.  I had started him on Imdur for ?microvascular angina but he developed severe headaches without any improvement in symptoms.  I will have him stop Imdur.  Continue ASA 81 and Crestor.  2. Aortic stenosis: Bicuspid valve with moderate AS by TEE and TTE.  Given dyspnea with exertion, will repeat echo in 6 months.  3. Hyperlipidemia: Good LDL on current statin dose.  4. Ascending aortic aneurysm: Associated with bicuspid aortic valve.  4.2 cm ascending aorta on 2/16 TEE.  He will need CTA or MRA to follow mild ascending aortic aneurysm in 2/17.  5. Chronic diastolic CHF: Elevated filling pressures on RHC 2/16.  Ongoing dyspnea.  I will increase Lasix to 40 mg daily with BMET/BNP today and in 2 wks.   6.  Dyspnea: Increasing Lasix as above.  Given history of prior smoking, I will also arrange for PFTs.    [name] 06/30/2014

## 2014-06-30 NOTE — Patient Instructions (Signed)
Increase lasix (furosemide) to 40mg  daily.   Your physician recommends that you return for lab work today--BMET/BNP.  Your physician has recommended that you have a pulmonary function test. Pulmonary Function Tests are a group of tests that measure how well air moves in and out of your lungs.  Your physician recommends that you return for lab work in: 2 weeks--BMET.  Your physician recommends that you schedule a follow-up appointment in: 2 months with Dr Aundra Dubin.

## 2014-07-21 ENCOUNTER — Other Ambulatory Visit (INDEPENDENT_AMBULATORY_CARE_PROVIDER_SITE_OTHER): Payer: BC Managed Care – PPO

## 2014-07-21 DIAGNOSIS — I350 Nonrheumatic aortic (valve) stenosis: Secondary | ICD-10-CM | POA: Diagnosis not present

## 2014-07-21 DIAGNOSIS — R0602 Shortness of breath: Secondary | ICD-10-CM | POA: Diagnosis not present

## 2014-07-21 DIAGNOSIS — I2510 Atherosclerotic heart disease of native coronary artery without angina pectoris: Secondary | ICD-10-CM | POA: Diagnosis not present

## 2014-07-21 NOTE — Addendum Note (Signed)
Addended by: Eulis Foster on: 07/21/2014 04:14 PM   Modules accepted: Orders

## 2014-07-22 LAB — BASIC METABOLIC PANEL
BUN: 18 mg/dL (ref 6–23)
CO2: 27 meq/L (ref 19–32)
Calcium: 8.9 mg/dL (ref 8.4–10.5)
Chloride: 103 mEq/L (ref 96–112)
Creatinine, Ser: 1.09 mg/dL (ref 0.40–1.50)
GFR: 73.22 mL/min (ref >60.00)
GLUCOSE: 108 mg/dL — AB (ref 70–99)
Potassium: 3.9 mEq/L (ref 3.5–5.1)
Sodium: 137 mEq/L (ref 135–145)

## 2014-07-24 ENCOUNTER — Ambulatory Visit (INDEPENDENT_AMBULATORY_CARE_PROVIDER_SITE_OTHER): Payer: BC Managed Care – PPO | Admitting: Internal Medicine

## 2014-07-24 DIAGNOSIS — I350 Nonrheumatic aortic (valve) stenosis: Secondary | ICD-10-CM

## 2014-07-24 DIAGNOSIS — R0602 Shortness of breath: Secondary | ICD-10-CM

## 2014-07-24 DIAGNOSIS — I2510 Atherosclerotic heart disease of native coronary artery without angina pectoris: Secondary | ICD-10-CM

## 2014-07-24 LAB — PULMONARY FUNCTION TEST
DL/VA % pred: 98 %
DL/VA: 4.36 ml/min/mmHg/L
DLCO UNC % PRED: 83 %
DLCO UNC: 23.77 ml/min/mmHg
FEF 25-75 Post: 3.61 L/sec
FEF 25-75 Pre: 2.84 L/sec
FEF2575-%Change-Post: 26 %
FEF2575-%PRED-POST: 135 %
FEF2575-%Pred-Pre: 106 %
FEV1-%Change-Post: 6 %
FEV1-%PRED-POST: 90 %
FEV1-%PRED-PRE: 85 %
FEV1-PRE: 2.74 L
FEV1-Post: 2.93 L
FEV1FVC-%Change-Post: 5 %
FEV1FVC-%PRED-PRE: 106 %
FEV6-%Change-Post: 1 %
FEV6-%Pred-Post: 84 %
FEV6-%Pred-Pre: 83 %
FEV6-PRE: 3.38 L
FEV6-Post: 3.43 L
FEV6FVC-%Change-Post: 0 %
FEV6FVC-%PRED-PRE: 105 %
FEV6FVC-%Pred-Post: 105 %
FVC-%Change-Post: 1 %
FVC-%PRED-PRE: 79 %
FVC-%Pred-Post: 80 %
FVC-POST: 3.43 L
FVC-Pre: 3.38 L
POST FEV1/FVC RATIO: 85 %
POST FEV6/FVC RATIO: 100 %
PRE FEV6/FVC RATIO: 100 %
Pre FEV1/FVC ratio: 81 %
RV % pred: 86 %
RV: 1.81 L
TLC % PRED: 82 %
TLC: 5.28 L

## 2014-07-24 NOTE — Progress Notes (Signed)
PFT done today. 

## 2014-09-19 ENCOUNTER — Ambulatory Visit: Payer: BC Managed Care – PPO | Admitting: Cardiology

## 2014-10-10 ENCOUNTER — Encounter: Payer: Self-pay | Admitting: Gastroenterology

## 2014-11-24 ENCOUNTER — Encounter: Payer: Self-pay | Admitting: Family Medicine

## 2014-11-24 ENCOUNTER — Ambulatory Visit (INDEPENDENT_AMBULATORY_CARE_PROVIDER_SITE_OTHER): Payer: BC Managed Care – PPO | Admitting: Family Medicine

## 2014-11-24 VITALS — BP 139/83 | HR 64 | Temp 98.0°F | Ht 66.0 in | Wt 239.0 lb

## 2014-11-24 DIAGNOSIS — Z0000 Encounter for general adult medical examination without abnormal findings: Secondary | ICD-10-CM

## 2014-11-24 NOTE — Progress Notes (Signed)
Pre visit review using our clinic review tool, if applicable. No additional management support is needed unless otherwise documented below in the visit note. 

## 2014-11-24 NOTE — Progress Notes (Signed)
   Subjective:    Patient ID: Luis [name], male    DOB: 11-Jun-1955, 59 y.o.   MRN: [id number]  HPI 59 yr old male for a cpx. His main complaint today is feeling tired all the time and getting SOB on exertion. Of course he has aortic stenosis and he see Dr. Aundra Dubin frequently. He has has been worked up fairly thoroughly from a cardiac and a pulmonary perspective. Of note he has put on 30 lbs of weight in the past year. He is proud of quitting smoking.    Review of Systems  Constitutional: Negative.   HENT: Negative.   Eyes: Negative.   Respiratory: Positive for shortness of breath. Negative for apnea, cough, choking, chest tightness, wheezing and stridor.   Cardiovascular: Negative.   Gastrointestinal: Negative.   Genitourinary: Negative.   Musculoskeletal: Negative.   Skin: Negative.   Neurological: Negative.   Psychiatric/Behavioral: Negative.        Objective:   Physical Exam  Constitutional: He is oriented to person, place, and time. No distress.  Obese   HENT:  Head: Normocephalic and atraumatic.  Right Ear: External ear normal.  Left Ear: External ear normal.  Nose: Nose normal.  Mouth/Throat: Oropharynx is clear and moist. No oropharyngeal exudate.  Eyes: Conjunctivae and EOM are normal. Pupils are equal, round, and reactive to light. Right eye exhibits no discharge. Left eye exhibits no discharge. No scleral icterus.  Neck: Neck supple. No JVD present. No tracheal deviation present. No thyromegaly present.  Cardiovascular: Normal rate, regular rhythm, normal heart sounds and intact distal pulses.  Exam reveals no gallop and no friction rub.   No murmur heard. Pulmonary/Chest: Effort normal and breath sounds normal. No respiratory distress. He has no wheezes. He has no rales. He exhibits no tenderness.  Abdominal: Soft. Bowel sounds are normal. He exhibits no distension and no mass. There is no tenderness. There is no rebound and no guarding.  Genitourinary: Rectum  normal, prostate normal and penis normal. Guaiac negative stool. No penile tenderness.  Musculoskeletal: Normal range of motion. He exhibits no edema or tenderness.  Lymphadenopathy:    He has no cervical adenopathy.  Neurological: He is alert and oriented to person, place, and time. He has normal reflexes. No cranial nerve deficit. He exhibits normal muscle tone. Coordination normal.  Skin: Skin is warm and dry. No rash noted. He is not diaphoretic. No erythema. No pallor.  Psychiatric: He has a normal mood and affect. His behavior is normal. Judgment and thought content normal.          Assessment & Plan:  Well exam. We discussed diet and exercise advice. I think a large part of his fatigue is poor conditioning and being overweight. We will set up fasting labs in the near future. He is past due for a colonoscopy so we will arrange for this.

## 2014-11-26 ENCOUNTER — Other Ambulatory Visit (INDEPENDENT_AMBULATORY_CARE_PROVIDER_SITE_OTHER): Payer: BC Managed Care – PPO

## 2014-11-26 ENCOUNTER — Encounter: Payer: Self-pay | Admitting: Internal Medicine

## 2014-11-26 DIAGNOSIS — Z0000 Encounter for general adult medical examination without abnormal findings: Secondary | ICD-10-CM

## 2014-11-26 LAB — HEPATIC FUNCTION PANEL
ALT: 21 U/L (ref 0–53)
AST: 19 U/L (ref 0–37)
Albumin: 4.3 g/dL (ref 3.5–5.2)
Alkaline Phosphatase: 82 U/L (ref 39–117)
Bilirubin, Direct: 0.2 mg/dL (ref 0.0–0.3)
TOTAL PROTEIN: 7.1 g/dL (ref 6.0–8.3)
Total Bilirubin: 1 mg/dL (ref 0.2–1.2)

## 2014-11-26 LAB — TSH: TSH: 0.91 u[IU]/mL (ref 0.35–4.50)

## 2014-11-26 LAB — CBC WITH DIFFERENTIAL/PLATELET
BASOS ABS: 0 10*3/uL (ref 0.0–0.1)
BASOS PCT: 0.4 % (ref 0.0–3.0)
Eosinophils Absolute: 0.8 10*3/uL — ABNORMAL HIGH (ref 0.0–0.7)
Eosinophils Relative: 10.4 % — ABNORMAL HIGH (ref 0.0–5.0)
HCT: 41.2 % (ref 39.0–52.0)
HEMOGLOBIN: 14.1 g/dL (ref 13.0–17.0)
LYMPHS PCT: 24.1 % (ref 12.0–46.0)
Lymphs Abs: 1.9 10*3/uL (ref 0.7–4.0)
MCHC: 34.1 g/dL (ref 30.0–36.0)
MCV: 87.5 fl (ref 78.0–100.0)
Monocytes Absolute: 0.5 10*3/uL (ref 0.1–1.0)
Monocytes Relative: 6 % (ref 3.0–12.0)
NEUTROS ABS: 4.6 10*3/uL (ref 1.4–7.7)
Neutrophils Relative %: 59.1 % (ref 43.0–77.0)
PLATELETS: 165 10*3/uL (ref 150.0–400.0)
RBC: 4.71 Mil/uL (ref 4.22–5.81)
RDW: 14 % (ref 11.5–15.5)
WBC: 7.7 10*3/uL (ref 4.0–10.5)

## 2014-11-26 LAB — POCT URINALYSIS DIPSTICK
Bilirubin, UA: NEGATIVE
Glucose, UA: NEGATIVE
KETONES UA: NEGATIVE
Leukocytes, UA: NEGATIVE
Nitrite, UA: NEGATIVE
PROTEIN UA: NEGATIVE
Spec Grav, UA: <=1.005
Urobilinogen, UA: 0.2
pH, UA: 5

## 2014-11-26 LAB — BASIC METABOLIC PANEL
BUN: 18 mg/dL (ref 6–23)
CO2: 30 mEq/L (ref 19–32)
Calcium: 9.2 mg/dL (ref 8.4–10.5)
Chloride: 102 mEq/L (ref 96–112)
Creatinine, Ser: 0.95 mg/dL (ref 0.40–1.50)
GFR: 85.71 mL/min (ref >60.00)
GLUCOSE: 95 mg/dL (ref 70–99)
POTASSIUM: 4.7 meq/L (ref 3.5–5.1)
SODIUM: 139 meq/L (ref 135–145)

## 2014-11-26 LAB — LIPID PANEL
CHOLESTEROL: 161 mg/dL (ref 0–200)
HDL: 58.4 mg/dL (ref >39.00)
LDL Cholesterol: 78 mg/dL (ref 0–99)
NonHDL: 102.86
Total CHOL/HDL Ratio: 3
Triglycerides: 125 mg/dL (ref 0.0–149.0)
VLDL: 25 mg/dL (ref 0.0–40.0)

## 2014-11-26 LAB — PSA: PSA: 1.69 ng/mL (ref 0.10–4.00)

## 2014-12-24 ENCOUNTER — Ambulatory Visit (INDEPENDENT_AMBULATORY_CARE_PROVIDER_SITE_OTHER): Payer: BC Managed Care – PPO | Admitting: Cardiology

## 2014-12-24 ENCOUNTER — Encounter: Payer: Self-pay | Admitting: *Deleted

## 2014-12-24 ENCOUNTER — Encounter: Payer: Self-pay | Admitting: Cardiology

## 2014-12-24 VITALS — BP 128/88 | HR 64 | Ht 66.0 in | Wt 237.0 lb

## 2014-12-24 DIAGNOSIS — I5032 Chronic diastolic (congestive) heart failure: Secondary | ICD-10-CM | POA: Diagnosis not present

## 2014-12-24 DIAGNOSIS — G4733 Obstructive sleep apnea (adult) (pediatric): Secondary | ICD-10-CM | POA: Diagnosis not present

## 2014-12-24 DIAGNOSIS — J302 Other seasonal allergic rhinitis: Secondary | ICD-10-CM

## 2014-12-24 DIAGNOSIS — I2510 Atherosclerotic heart disease of native coronary artery without angina pectoris: Secondary | ICD-10-CM

## 2014-12-24 DIAGNOSIS — I350 Nonrheumatic aortic (valve) stenosis: Secondary | ICD-10-CM | POA: Diagnosis not present

## 2014-12-24 MED ORDER — IPRATROPIUM BROMIDE 0.03 % NA SOLN: 2.0000 | Freq: Two times a day (BID) | NASAL | Status: DC

## 2014-12-24 NOTE — Patient Instructions (Addendum)
Medication Instructions:  Use atrovent nasal spray 2 sprays both nostrils every 12 hours.  Labwork: None today  Testing/Procedures: Your physician has requested that you have an echocardiogram. Echocardiography is a painless test that uses sound waves to create images of your heart. It provides your doctor with information about the size and shape of your heart and how well your heart's chambers and valves are working. This procedure takes approximately one hour. There are no restrictions for this procedure. September 2016    Follow-Up: Your physician wants you to follow-up in: 6 months with Dr Aundra Dubin. (February 2017). You will receive a reminder letter in the mail two months in advance. If you don't receive a letter, please call our office to schedule the follow-up appointment.    Any Other Special Instructions Will Be Listed Below (If Applicable).  You have been referred to [facility].

## 2014-12-25 NOTE — Progress Notes (Signed)
Patient ID: Luis [name], male   DOB: Mar 28, 1956, 59 y.o.   MRN: [id number] PCP: Dr. Sarajane Jews  59 yo with history of CAD and moderate aortic stenosis presents for cardiology followup. He has had trouble now for a number of months with exertional dyspnea.  He is short of breath after walking 75 yards.  No chest pain.  No orthopnea.  He also has a lot of trouble with nasal congestion, so much that he has been unable to wear his CPAP.  He thinks that he is allergic to grass, hay, etc.  He is also limited by knee pain.     I had him do a Lexiscan Cardiolite in 2/16.  This showed no ischemia or infarction.  I also had him get an echo that showed ?bicuspid aortic valve and probably moderate aortic stenosis.  Given ongoing symptoms, I had him do a RHC/LHC and TEE in 2/16.  The RHC showed mildly elevated left and right heart filling pressures and the LHC showed nonobstructive CAD.  TEE confirmed bicuspid aortic valve with moderate AS.  I started him on Imdur for ?microvascular disease and Lasix 20 mg daily. The Imdur is giving him a headache.  He had PFTs in 4/16 that were within normal limits.  Labs (2/15): K 4.4, creatinine 0.9, LDL 71, HDL 53 Labs (2/16): K 4.8, creatinine 1.0, LDL 62, HDL 57 Labs (8/16): K 4.7, creatinine 0.95, TSH normal, HCT 41.2, LDL 78, HDL 58  PMH: 1. OSA: Uses CPAP 2. CAD: Cardiolite 8/12 with inferior infarct and peri-infarct ischemia, had PCI to the mid and distal RCA with Promus DES x 2.  Lexiscan Cardiolite (2/16) with no ischemia or infarction. LHC/RHC (2/16) with mean RA 12, PA 32/15, mean PCWP 18, CI 3.47; patent mid and distal RCA stents, 50-60% proximal stenosis small PDA.   3. Carotid stenosis: 2/15 carotid dopplers 1-39% bilateral ICA stenosis.  4. Colon polyps 5. HTN 6. Hyperlipidemia 7. Abdominal US (10/12) with no AAA 8. Aortic stenosis: Echo (2/15) with EF 60-65%, mild LVH, moderate diastolic dysfunction, normal RV size/systolic function, moderate aortic stenosis  with mean gradient 31 and AVA 1.16 cm2, ascending aorta 4.4 cm.  Echo (2/16) with EF 60-65%, grade II diastolic dysfunction, ?bicuspid aortic valve with moderate AS (mean gradient 35 mmHg, AVA 1.5 cm^2), mildly dilated RV with normal systolic function.  TEE (2/16) with bicuspid aortic valve, moderate AS mean gradient 30 mmHg, AVA > 1 cm^2, EF 55-60%, mild LVH, mildly dilated RV with normal systolic function, mild AI, mild MR, ascending aorta 4.2 cm.  9. Ascending aorta aneurysm: 4.4 cm on 2/15 echo. Ascending aorta 4.2 cm TEE 2/16.  10. Chronic diastolic CHF.  11. PFTs (4/16) were within normal limits.   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   ROS: All systems reviewed and negative except as per HPI.   Current Outpatient Prescriptions  Medication Sig Dispense Refill  . aspirin 81 MG tablet Take 81 mg by mouth every morning.     [name] CRESTOR 20 MG tablet TAKE 1 TABLET BY MOUTH AT BEDTIME 30 tablet 6  . escitalopram (LEXAPRO) 20 MG tablet TAKE 1 TABLET BY MOUTH DAILY. 30 tablet 11  . furosemide (LASIX) 40 MG tablet Take 1 tablet (40 mg total) by mouth daily. 90 tablet 3  . KLOR-CON 10 10 MEQ tablet Take 10 mEq by mouth daily.  6  . meclizine (ANTIVERT) 25 MG tablet Take 1 tablet (25  mg total) by mouth 3 (three) times daily as needed. 30 tablet 0  . nitroGLYCERIN (NITROSTAT) 0.4 MG SL tablet Place 1 tablet (0.4 mg total) under the tongue every 5 (five) minutes as needed. For chest pain. 25 tablet 0  . ipratropium (ATROVENT) 0.03 % nasal spray Place 2 sprays into both nostrils every 12 (twelve) hours. 30 mL 1   No current facility-administered medications for this visit.   BP 128/88 mmHg  Pulse 64  Ht 5\' 6"  (1.676 m)  Wt 237 lb (107.502 kg)  BMI 38.27 kg/m2 General: NAD Neck: Thick, no JVD, no thyromegaly or thyroid nodule.  Lungs: Clear to auscultation bilaterally with normal respiratory effort. CV: Nondisplaced PMI.  Heart regular S1/S2, no K8/M3,  3/6 systolic crescendo-decrescendo murmur RUSB with somewhat muffled S2.  No peripheral edema.  No carotid bruit.  Normal pedal pulses.  Abdomen: Soft, nontender, no hepatosplenomegaly, no distention.  Skin: Intact without lesions or rashes.  Neurologic: Alert and oriented x 3.  Psych: Normal affect. Extremities: No clubbing or cyanosis.  HEENT: Normal.   Assessment/Plan: 1. CAD: Prior PCI to RCA in 8/12.  LHC (2/16) with nonobstructive disease.  I had started him on Imdur for ?microvascular angina but he developed severe headaches without any improvement in symptoms.  Continue ASA 81 and Crestor.  2. Aortic stenosis: Bicuspid valve with moderate AS by TEE and TTE.  Given dyspnea with exertion, will repeat echo in 6 months => will arrange for 9/16.  3. Hyperlipidemia: Good LDL on current statin dose.  4. Ascending aortic aneurysm: Associated with bicuspid aortic valve.  4.2 cm ascending aorta on 2/16 TEE.  He will need CTA or MRA to follow mild ascending aortic aneurysm in 2/17.  5. Chronic diastolic CHF:  Lasix has not really helped his symptoms.  Will continue current dose.    6.  Dyspnea: Ongoing dyspnea.  He has moderate AS and nonobstructive coronary disease.  I do not think that he is significantly volume overloaded at this point on Lasix.  PFTs were within normal limits.  Currently, a lot of his problem seems to be with nasal and sinus congestion.  I will have him try atrovent nasal spray and will have him see an allergist. He needs to clear the congestion so he can get back on CPAP at night.     [name] 12/25/2014

## 2015-01-02 ENCOUNTER — Other Ambulatory Visit: Payer: Self-pay

## 2015-01-02 ENCOUNTER — Ambulatory Visit (HOSPITAL_COMMUNITY): Payer: BC Managed Care – PPO | Attending: Cardiovascular Disease

## 2015-01-02 DIAGNOSIS — F17200 Nicotine dependence, unspecified, uncomplicated: Secondary | ICD-10-CM | POA: Diagnosis not present

## 2015-01-02 DIAGNOSIS — I350 Nonrheumatic aortic (valve) stenosis: Secondary | ICD-10-CM

## 2015-01-02 DIAGNOSIS — E785 Hyperlipidemia, unspecified: Secondary | ICD-10-CM | POA: Insufficient documentation

## 2015-01-02 DIAGNOSIS — I2510 Atherosclerotic heart disease of native coronary artery without angina pectoris: Secondary | ICD-10-CM | POA: Diagnosis not present

## 2015-01-02 DIAGNOSIS — I517 Cardiomegaly: Secondary | ICD-10-CM | POA: Insufficient documentation

## 2015-01-02 DIAGNOSIS — I352 Nonrheumatic aortic (valve) stenosis with insufficiency: Secondary | ICD-10-CM | POA: Diagnosis not present

## 2015-01-02 DIAGNOSIS — J302 Other seasonal allergic rhinitis: Secondary | ICD-10-CM | POA: Diagnosis not present

## 2015-01-02 HISTORY — DX: Nonrheumatic aortic (valve) stenosis: I35.0

## 2015-01-08 ENCOUNTER — Encounter: Payer: Self-pay | Admitting: Family Medicine

## 2015-01-09 ENCOUNTER — Encounter: Payer: Self-pay | Admitting: Cardiology

## 2015-01-09 NOTE — Telephone Encounter (Signed)
I don't think he would need a new referral to see another doctor in that group. He can simply call them to set up an appt

## 2015-01-13 ENCOUNTER — Telehealth: Payer: Self-pay | Admitting: Internal Medicine

## 2015-01-13 DIAGNOSIS — G4733 Obstructive sleep apnea (adult) (pediatric): Secondary | ICD-10-CM

## 2015-01-13 NOTE — Telephone Encounter (Signed)
Pt returned call [phone]

## 2015-01-13 NOTE — Telephone Encounter (Signed)
Called and spoke to pt. Informed him of the refill of supplies. Order placed. Appt made with CY in 04/2015. Pt verbalized understanding and denied any further questions or concerns at this time.

## 2015-01-13 NOTE — Telephone Encounter (Signed)
Ok to refill CPAP mask of choice, supplies     Dx OSA

## 2015-01-13 NOTE — Telephone Encounter (Signed)
[id number] x1 

## 2015-01-13 NOTE — Telephone Encounter (Signed)
Called and spoke with pt Pt states needing CPAP supplies reordered through [facility] patient Last OV 03/26/2012 Pt not willing to make OV at this time  Dr Annamaria Boots, please advise. Thanks   No Known Allergies   Current Outpatient Prescriptions on File Prior to Visit  Medication Sig Dispense Refill  . aspirin 81 MG tablet Take 81 mg by mouth every morning.     [name] CRESTOR 20 MG tablet TAKE 1 TABLET BY MOUTH AT BEDTIME 30 tablet 6  . escitalopram (LEXAPRO) 20 MG tablet TAKE 1 TABLET BY MOUTH DAILY. 30 tablet 11  . furosemide (LASIX) 40 MG tablet Take 1 tablet (40 mg total) by mouth daily. 90 tablet 3  . ipratropium (ATROVENT) 0.03 % nasal spray Place 2 sprays into both nostrils every 12 (twelve) hours. 30 mL 1  . KLOR-CON 10 10 MEQ tablet Take 10 mEq by mouth daily.  6  . meclizine (ANTIVERT) 25 MG tablet Take 1 tablet (25 mg total) by mouth 3 (three) times daily as needed. 30 tablet 0  . nitroGLYCERIN (NITROSTAT) 0.4 MG SL tablet Place 1 tablet (0.4 mg total) under the tongue every 5 (five) minutes as needed. For chest pain. 25 tablet 0   No current facility-administered medications on file prior to visit.

## 2015-01-14 ENCOUNTER — Ambulatory Visit (INDEPENDENT_AMBULATORY_CARE_PROVIDER_SITE_OTHER): Payer: BC Managed Care – PPO | Admitting: Family Medicine

## 2015-01-14 ENCOUNTER — Encounter: Payer: Self-pay | Admitting: Family Medicine

## 2015-01-14 VITALS — BP 153/80 | HR 62 | Temp 97.9°F | Ht 66.0 in | Wt 232.0 lb

## 2015-01-14 DIAGNOSIS — F411 Generalized anxiety disorder: Secondary | ICD-10-CM | POA: Insufficient documentation

## 2015-01-14 DIAGNOSIS — J310 Chronic rhinitis: Secondary | ICD-10-CM | POA: Insufficient documentation

## 2015-01-14 MED ORDER — DIAZEPAM 5 MG PO TABS: 5.0000 mg | ORAL_TABLET | Freq: Three times a day (TID) | ORAL | Status: DC | PRN

## 2015-01-14 MED ORDER — METHYLPREDNISOLONE 4 MG PO TBPK: ORAL_TABLET | ORAL | Status: DC

## 2015-01-14 NOTE — Progress Notes (Signed)
   Subjective:    Patient ID: Luis [name], male    DOB: 1956/02/21, 59 y.o.   MRN: [id number]  HPI Here asking for advice about nasal congestion and anxiety. He has chronic nasal congestion and when he lies down at night he cannot breathe through the nose at all. This makes it impossible to wear his CPAP mask and it gives him anxiety attacks. He took Valium in the past and he wants to try this again. He has seen Dr. Verlin Fester for allergy consultations and they have given him Flonase and Atrovent sprays,  but these have not helped. The only thing that helps is oral prednisone. He is scheduled to see Dr. Janace Hoard next week for an ENT consult.    Review of Systems  Constitutional: Negative.   HENT: Positive for congestion and sinus pressure. Negative for rhinorrhea.   Eyes: Negative.   Respiratory: Negative.   Psychiatric/Behavioral: Positive for agitation. Negative for dysphoric mood. The patient is nervous/anxious.        Objective:   Physical Exam  Constitutional: He appears well-developed and well-nourished.  HENT:  Right Ear: External ear normal.  Left Ear: External ear normal.  Nose: Nose normal.  Mouth/Throat: Oropharynx is clear and moist.  Eyes: Conjunctivae are normal.  Neck: No thyromegaly present.  Cardiovascular: Normal rate, regular rhythm, normal heart sounds and intact distal pulses.   Pulmonary/Chest: Effort normal and breath sounds normal.  Lymphadenopathy:    He has no cervical adenopathy.  Psychiatric: He has a normal mood and affect. His behavior is normal. Thought content normal.          Assessment & Plan:  He is given a Medrol dose pack to reduce the nasal congestion temporarily until he can see ENT. It is likely he will require rhinoplasty. Given Valium to use for anxiety.

## 2015-01-14 NOTE — Progress Notes (Signed)
Pre visit review using our clinic review tool, if applicable. No additional management support is needed unless otherwise documented below in the visit note. 

## 2015-01-19 ENCOUNTER — Other Ambulatory Visit: Payer: Self-pay | Admitting: Cardiology

## 2015-01-19 ENCOUNTER — Other Ambulatory Visit: Payer: Self-pay | Admitting: Physician Assistant

## 2015-01-28 ENCOUNTER — Telehealth: Payer: Self-pay | Admitting: *Deleted

## 2015-01-28 NOTE — Telephone Encounter (Signed)
Luis [name],  This pt is cleared for anesthetic care at [facility].  Thanks,  [name] 

## 2015-01-28 NOTE — Telephone Encounter (Signed)
Luis [name],  [facility] you are well.  Can you please look at this patients cardiac history just to make sure he is appropriate for the [place]. Its pretty complicated. His last echo was 01-02-15 with an EF of 60-65% and moderate stenosis.  Thanks for your time  Marijean Niemann

## 2015-01-28 NOTE — Telephone Encounter (Signed)
Proceed as scheduled  Marie PV  

## 2015-02-02 ENCOUNTER — Ambulatory Visit (AMBULATORY_SURGERY_CENTER): Payer: Self-pay

## 2015-02-02 VITALS — Ht 66.0 in | Wt 234.0 lb

## 2015-02-02 DIAGNOSIS — Z86010 Personal history of colonic polyps: Secondary | ICD-10-CM

## 2015-02-02 DIAGNOSIS — I350 Nonrheumatic aortic (valve) stenosis: Secondary | ICD-10-CM

## 2015-02-02 MED ORDER — NA SULFATE-K SULFATE-MG SULF 17.5-3.13-1.6 GM/177ML PO SOLN: 1.0000 | Freq: Once | ORAL | Status: DC

## 2015-02-02 NOTE — Progress Notes (Signed)
No egg or soy allergy.  No previous complications from anesthesia. No home O2. No diet meds. 

## 2015-02-16 ENCOUNTER — Ambulatory Visit (AMBULATORY_SURGERY_CENTER): Payer: BC Managed Care – PPO | Admitting: Internal Medicine

## 2015-02-16 ENCOUNTER — Encounter: Payer: Self-pay | Admitting: Internal Medicine

## 2015-02-16 VITALS — BP 117/81 | HR 56 | Temp 97.4°F | Resp 19 | Ht 66.0 in | Wt 234.0 lb

## 2015-02-16 DIAGNOSIS — D124 Benign neoplasm of descending colon: Secondary | ICD-10-CM | POA: Diagnosis not present

## 2015-02-16 DIAGNOSIS — Z86010 Personal history of colonic polyps: Secondary | ICD-10-CM | POA: Diagnosis not present

## 2015-02-16 MED ORDER — SODIUM CHLORIDE 0.9 % IV SOLN: 500.0000 mL | INTRAVENOUS | Status: DC

## 2015-02-16 NOTE — Progress Notes (Signed)
Called to room to assist during endoscopic procedure.  Patient ID and intended procedure confirmed with present staff. Received instructions for my participation in the procedure from the performing physician.  

## 2015-02-16 NOTE — Progress Notes (Signed)
  [facility] Post-op Note  Patient: Luis [name]  Procedure(s) Performed: colonoscopy  Patient Location: [facility] - Recovery Area  Anesthesia Type: Deep Sedation/Propofol  Level of Consciousness: awake, oriented and patient cooperative  Airway and Oxygen Therapy: Patient Spontanous Breathing  Post-op Pain: none  Post-op Assessment:  Post-op Vital signs reviewed, Patient's Cardiovascular Status Stable, Respiratory Function Stable, Patent Airway, No signs of Nausea or vomiting and Pain level controlled  Post-op Vital Signs: Reviewed and stable  Complications: No apparent anesthesia complications  Curly Mackowski E 10:04 AM

## 2015-02-16 NOTE — Patient Instructions (Signed)

## 2015-02-16 NOTE — Op Note (Signed)
[facility]  [address]. [place], [address]   COLONOSCOPY PROCEDURE REPORT  PATIENT: Luis, [name]  MR#: [id number] BIRTHDATE: 1955/09/21 , 59  yrs. old GENDER: male ENDOSCOPIST: Jerene Bears, MD REFERRED RX:[id number] Consuello Masse, M.D. PROCEDURE DATE:  02/16/2015 PROCEDURE:   Colonoscopy, surveillance and Colonoscopy with snare polypectomy First Screening Colonoscopy - Avg.  risk and is 50 yrs.  old or older - No.  Prior Negative Screening - Now for repeat screening. N/A  History of Adenoma - Now for follow-up colonoscopy & has been > or = to 3 yrs.  N/A  Polyps removed today? Yes ASA CLASS:   Class III INDICATIONS:Surveillance due to prior colonic neoplasia and PH Colon remote colon polyp, last colon normal 2010 Shar[facility] Iles) MEDICATIONS: Monitored anesthesia care and Propofol 250 mg IV  DESCRIPTION OF PROCEDURE:   After the risks benefits and alternatives of the procedure were thoroughly explained, informed consent was obtained.  The digital rectal exam revealed no rectal mass.   The LB [id number]  endoscope was introduced through the anus and advanced to the cecum, which was identified by both the appendix and ileocecal valve. No adverse events experienced. The quality of the prep was good.  (Suprep was used)  The instrument was then slowly withdrawn as the colon was fully examined. Estimated blood loss is zero unless otherwise noted in this procedure report.  COLON FINDINGS: A sessile polyp measuring 5 mm in size was found in the descending colon.  A polypectomy was performed with a cold snare.  The resection was complete, the polyp tissue was completely retrieved and sent to histology.   The examination was otherwise normal.  Retroflexed views revealed internal hemorrhoids. The time to cecum = 2.9 Withdrawal time = 9.1   The scope was withdrawn and the procedure completed. COMPLICATIONS: There were no immediate complications.  ENDOSCOPIC  IMPRESSION: 1.   Sessile polyp was found in the descending colon; polypectomy was performed with a cold snare 2.   The examination was otherwise normal  RECOMMENDATIONS: 1.  Await pathology results 2.  If the polyp removed today is proven to be an adenomatous (pre-cancerous) polyp, you will need a repeat colonoscopy in 5 years.  Otherwise you should continue to follow colorectal cancer screening guidelines for "routine risk" patients with colonoscopy in 10 years.  You will receive a letter within 1-2 weeks with the results of your biopsy as well as final recommendations.  Please call my office if you have not received a letter after 3 weeks.  eSigned:  Jerene Bears, MD 02/16/2015 10:02 AM   cc: Laurey Morale, MD and The Patient

## 2015-02-17 ENCOUNTER — Telehealth: Payer: Self-pay | Admitting: *Deleted

## 2015-02-17 NOTE — Telephone Encounter (Signed)
No answer, left message to call if questions or concerns. 

## 2015-02-18 ENCOUNTER — Ambulatory Visit: Payer: Self-pay | Admitting: Allergy and Immunology

## 2015-02-19 ENCOUNTER — Encounter: Payer: Self-pay | Admitting: Internal Medicine

## 2015-03-03 ENCOUNTER — Other Ambulatory Visit: Payer: Self-pay | Admitting: Family Medicine

## 2015-03-13 ENCOUNTER — Telehealth: Payer: Self-pay | Admitting: Family Medicine

## 2015-03-13 ENCOUNTER — Telehealth: Payer: Self-pay | Admitting: Cardiology

## 2015-03-13 MED ORDER — AMOXICILLIN 500 MG PO CAPS: ORAL_CAPSULE | ORAL | Status: DC

## 2015-03-13 NOTE — Telephone Encounter (Signed)
Pt had 4 teeth extraction today at dental office on 29 and pt has heart valve and needs abx call into [facility]

## 2015-03-13 NOTE — Telephone Encounter (Signed)
NewMessage  Pt wife calling to speak w/ RN concerng pt's recent dental surgery. Please call back and discuss.

## 2015-03-13 NOTE — Telephone Encounter (Signed)
I sent script e-scribe and left a voice message for pt. 

## 2015-03-13 NOTE — Telephone Encounter (Signed)
Call in Amoxicillin 500 mg to take 4 tabs today and 4 tomorrow , #8 with no rf

## 2015-03-13 NOTE — Telephone Encounter (Signed)
Pt had a dental procedure done today.  He wanted to know if he should be on any antibiotics.  Advised that he didn't need any antibiotics from a cardiology standpoint.  Pt has aortic stenosis, has never had BE.

## 2015-04-29 ENCOUNTER — Institutional Professional Consult (permissible substitution): Payer: BC Managed Care – PPO | Admitting: Internal Medicine

## 2015-05-12 ENCOUNTER — Institutional Professional Consult (permissible substitution): Payer: BC Managed Care – PPO | Admitting: Internal Medicine

## 2015-07-04 ENCOUNTER — Other Ambulatory Visit: Payer: Self-pay | Admitting: Cardiology

## 2015-07-14 ENCOUNTER — Other Ambulatory Visit: Payer: Self-pay | Admitting: Family Medicine

## 2015-07-14 MED ORDER — ESCITALOPRAM OXALATE 20 MG PO TABS: 20.0000 mg | ORAL_TABLET | Freq: Every day | ORAL | Status: DC

## 2015-07-21 ENCOUNTER — Other Ambulatory Visit: Payer: Self-pay | Admitting: Otolaryngology

## 2015-07-21 ENCOUNTER — Ambulatory Visit
Admission: RE | Admit: 2015-07-21 | Discharge: 2015-07-21 | Disposition: A | Discharge status: Home / Self Care | Payer: BC Managed Care – PPO | Source: Ambulatory Visit | Attending: Otolaryngology | Admitting: Otolaryngology

## 2015-07-21 DIAGNOSIS — J329 Chronic sinusitis, unspecified: Secondary | ICD-10-CM

## 2015-07-21 DIAGNOSIS — R0981 Nasal congestion: Secondary | ICD-10-CM

## 2015-08-03 ENCOUNTER — Telehealth: Payer: Self-pay | Admitting: Cardiology

## 2015-08-03 NOTE — Telephone Encounter (Signed)
I will forward to Dr McLean for review 

## 2015-08-03 NOTE — Telephone Encounter (Signed)
New message   Request for surgical clearance:  1. What type of surgery is being performed? Nasal sinus surgery   2. When is this surgery scheduled? Pending   3. Are there any medications that need to be held prior to surgery and how long? Per MD request    4. Name of physician performing surgery? Dr. Radene Journey   5. What is your office phone and fax number? [phone] / fax # fax [phone]

## 2015-08-04 NOTE — Telephone Encounter (Signed)
Moderate AS on fairly recent echo.  As long as he has had no new dyspnea or chest pain, should be ok for surgery.

## 2015-08-10 NOTE — Telephone Encounter (Signed)
This message has been faxed to Dr Radene Journey through the [facility] system.

## 2015-08-11 NOTE — Progress Notes (Signed)
Cardiology Office Note    Date:  08/12/2015   ID:  RIGSBY PEREGRINO, DOB 12-06-1955, MRN [id number]  PCP:  Laurey Morale, MD  Cardiologist:  Dr. Aundra Dubin   Follow up  History of Present Illness:  Luis [name] is a 60 y.o. male with history of CAD s/p DESx2 to RCA (2012), bicuspid AV with mod AS, OSA on CPAP, ascending aortic aneurysm and HLD who presents to clinic for follow-up.  2D ECHO in 05/2014 showed ?bicuspid aortic valve and probable moderate aortic stenosis. He complained of ongoing dyspnea so Dr. Aundra Dubin set him up for a RHC/LHC and TEE in 2/16. The RHC showed mildly elevated left and right heart filling pressures and the LHC showed nonobstructive CAD. TEE confirmed bicuspid aortic valve with moderate AS. He was started on Imdur for ?microvascular disease and Lasix 20 mg daily. The Imdur did not help his dyspnea and gave him a significant headache so it was discontinued. He had PFTs in 4/16 that were within normal limits.  He was last seen by Dr. Aundra Dubin in 11/2014. His dyspnea was felt to be due to allergies and he was started on Atrovent and sent to an allergist.   Today he presents to clinic for follow-up. He was seen by Dr. Lucia Gaskins, an ENT dr and was found to have complete opacification of right maxillary sinus. He will require surgery on this with general anaesthesia. No CP or SOB. No LE edema, orthopnea or PND. No dizziness or syncope. He cannot wear the CPAP due to ENT issues.    PMH: 1. OSA: Uses CPAP 2. CAD: Cardiolite 8/12 with inferior infarct and peri-infarct ischemia, had PCI to the mid and distal RCA with Promus DES x 2. Lexiscan Cardiolite (2/16) with no ischemia or infarction. LHC/RHC (2/16) with mean RA 12, PA 32/15, mean PCWP 18, CI 3.47; patent mid and distal RCA stents, 50-60% proximal stenosis small PDA.  3. Carotid stenosis: 2/15 carotid dopplers 1-39% bilateral ICA stenosis.  4. Colon polyps 5. HTN 6. Hyperlipidemia 7. Abdominal US (10/12) with no  AAA 8. Aortic stenosis: Echo (2/15) with EF 60-65%, mild LVH, moderate diastolic dysfunction, normal RV size/systolic function, moderate aortic stenosis with mean gradient 31 and AVA 1.16 cm2, ascending aorta 4.4 cm. Echo (2/16) with EF 60-65%, grade II diastolic dysfunction, ?bicuspid aortic valve with moderate AS (mean gradient 35 mmHg, AVA 1.5 cm^2), mildly dilated RV with normal systolic function. TEE (2/16) with bicuspid aortic valve, moderate AS mean gradient 30 mmHg, AVA > 1 cm^2, EF 55-60%, mild LVH, mildly dilated RV with normal systolic function, mild AI, mild MR, ascending aorta 4.2 cm.  9. Ascending aorta aneurysm: 4.4 cm on 2/15 echo. Ascending aorta 4.2 cm TEE 2/16.  10. Chronic diastolic CHF.  11. PFTs (4/16) were within normal limits.   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      Past Medical History  Diagnosis Date  . Aortic stenosis, moderate 01-02-15    echo 01-02-15  . Anxiety   . Carotid stenosis     11/09 dopplers with 0-39% bilat. ICA;   Carotid US (05/2013):  Bilateral 1-39% ICA; L thyroid nodule (prior hx of aspiration) - f/u 2 years.  . History of colonic polyps     hyperplastic  . Rectal bleeding   . CAD (coronary artery disease)     s/p PCI of the RCA 8/12 with DES by Dr Burt Knack, preserved EF  . External  hemorrhoids   . Tobacco use disorder     quit since 9/12  . Heart murmur   . Heart attack ([place])   . ED (erectile dysfunction)   . Sleep apnea     STOPBANG=5  . Arthritis   . Depression   . CHF (congestive heart failure) ([place])   . Hyperlipidemia     Past Surgical History  Procedure Laterality Date  . Tonsillectomy    . Cataract extraction   4 YRS AGO    BOTH EYES  . Right elbow surgery    . Solonscopy  05/23/08    per Dr. Wynona Luna hemorrhoids only, repeat in 5 years  . Neck surgery  03/23/09    per Dr. Lorin Mercy, cervical fusion   . Coronary stenting      s/p PCI of the RCA by Dr Burt Knack 8/12  with 2 promus stents  . Cholecystectomy  07/21/2011    Procedure: LAPAROSCOPIC CHOLECYSTECTOMY WITH INTRAOPERATIVE CHOLANGIOGRAM;  Surgeon: Shann Medal, MD;  Location: [facility] ORS;  Service: General;  Laterality: N/A;  . Left and right heart catheterization with coronary angiogram N/A 06/23/2014    Procedure: LEFT AND RIGHT HEART CATHETERIZATION WITH CORONARY ANGIOGRAM;  Surgeon: Larey Dresser, MD;  Location: [facility] CATH LAB;  Service: Cardiovascular;  Laterality: N/A;  . Tee without cardioversion N/A 06/23/2014    Procedure: TRANSESOPHAGEAL ECHOCARDIOGRAM (TEE);  Surgeon: Larey Dresser, MD;  Location: [facility] ENDOSCOPY;  Service: Cardiovascular;  Laterality: N/A;    Current Medications: Outpatient Prescriptions Prior to Visit  Medication Sig Dispense Refill  . aspirin 81 MG tablet Take 81 mg by mouth every morning.     . diazepam (VALIUM) 5 MG tablet Take 1 tablet (5 mg total) by mouth every 8 (eight) hours as needed for anxiety. 60 tablet 2  . escitalopram (LEXAPRO) 20 MG tablet Take 1 tablet (20 mg total) by mouth daily. 90 tablet 3  . furosemide (LASIX) 40 MG tablet TAKE 1 TABLET (40 MG TOTAL) BY MOUTH DAILY. 90 tablet 0  . ibuprofen (ADVIL,MOTRIN) 200 MG tablet Take 200 mg by mouth every 6 (six) hours as needed.    [name] ipratropium (ATROVENT) 0.03 % nasal spray Place 2 sprays into both nostrils every 12 (twelve) hours. 30 mL 1  . KLOR-CON 10 10 MEQ tablet TAKE 1 TABLET BY MOUTH DAILY 30 tablet 6  . meclizine (ANTIVERT) 25 MG tablet Take 1 tablet (25 mg total) by mouth 3 (three) times daily as needed. 30 tablet 0  . nitroGLYCERIN (NITROSTAT) 0.4 MG SL tablet Place 1 tablet (0.4 mg total) under the tongue every 5 (five) minutes as needed. For chest pain. 25 tablet 0  . rosuvastatin (CRESTOR) 20 MG tablet TAKE 1 TABLET BY MOUTH AT BEDTIME 30 tablet 6  . amoxicillin (AMOXIL) 500 MG capsule Take 4 capsules today and take 4 tomorrow. 8 capsule 0   No facility-administered medications prior to visit.      Allergies:   Review of patient's allergies indicates no known allergies.   Social History   Social History  . Marital Status: Married    Spouse Name: N/A  . Number of Children: N/A  . Years of Education: N/A   Occupational History  . [employer]    Social History Main Topics  . Smoking status: Former Smoker -- 1.00 packs/day for 40 years    Quit date: 01/19/2011  . Smokeless tobacco: Never Used     Comment: will prescribe chantix  . Alcohol Use: No  .  Drug Use: No  . Sexual Activity: Not Asked   Other Topics Concern  . None   Social History Narrative     Family History:  The patient's family history includes Cancer in his mother and other; Heart disease in his other. There is no history of Colon cancer, Esophageal cancer, Rectal cancer, or Stomach cancer.   ROS:   Please see the history of present illness.    ROS All other systems reviewed and are negative.   PHYSICAL EXAM:   VS:  BP 120/68 mmHg  Pulse 60  Ht 5\' 6"  (1.676 m)  Wt 232 lb (105.235 kg)  BMI 37.46 kg/m2   GEN: Well nourished, well developed, in no acute distress HEENT: normal Neck: no JVD, carotid bruits, or masses Cardiac: RRR; no murmurs, rubs, or gallops,no edema  Respiratory:  clear to auscultation bilaterally, normal work of breathing GI: soft, nontender, nondistended, + BS MS: no deformity or atrophy Skin: warm and dry, no rash Neuro:  Alert and Oriented x 3, Strength and sensation are intact Psych: euthymic mood, full affect  Wt Readings from Last 3 Encounters:  08/12/15 232 lb (105.235 kg)  02/16/15 234 lb (106.142 kg)  02/02/15 234 lb (106.142 kg)      Studies/Labs Reviewed:   EKG:  EKG is ordered today.  The ekg ordered today demonstrates sinus brady HR 58. ILBBB  Recent Labs: 11/26/2014: ALT 21; BUN 18; Creatinine, Ser 0.95; Hemoglobin 14.1; Platelets 165.0; Potassium 4.7; Sodium 139; TSH 0.91   Lipid Panel    Component Value Date/Time   CHOL 161 11/26/2014 0911   TRIG  125.0 11/26/2014 0911   HDL 58.40 11/26/2014 0911   CHOLHDL 3 11/26/2014 0911   VLDL 25.0 11/26/2014 0911   LDLCALC 78 11/26/2014 0911   LDLDIRECT 145.6 03/16/2007 0908    Additional studies/ records that were reviewed today include:  2D ECHO: 01/02/2015 LV EF: 60% - 65% Study Conclusions - Left ventricle: The cavity size was normal. Wall thickness was  increased in a pattern of mild LVH. Systolic function was normal.  The estimated ejection fraction was in the range of 60% to 65%.  Doppler parameters are consistent with abnormal left ventricular  relaxation (grade 1 diastolic dysfunction). - Aortic valve: AV is thickened, calcified with restricted motion  Peak and mean gradients through the valve are 43 and 29 mm Hg  repectively consistent with moderate aortic stenosis. In  comparison to echo from 05/2014, gradients are lower (mean on  previous echo was 35 mm Hg). There was trivial regurgitation.  Valve area (VTI): 1.2 cm^2. - Right atrium: The atrium was mildly dilated.  Cardiac Catheterization 2/29/17 Procedural Findings: Hemodynamics (mmHg) RA mean 12 RV 31/12 PA 32/15, mean 23 PCWP mean 18 AO 134/77  Oxygen saturations: PA 76% AO 96%  Cardiac Output (Fick) 7.53  Cardiac Index (Fick) 3.47  Final Conclusions: Patent RCA stents, moderate stenosis in the proximal PDA. This is a relatively small vessel. Mildly elevated left and right heart filling pressures.      ASSESSMENT:    1. Coronary artery disease involving native coronary artery of native heart without angina pectoris   2. Aortic stenosis with bicuspid valve   3. Ascending aortic aneurysm ([place])   4. HLD (hyperlipidemia)   5. Chronic diastolic CHF (congestive heart failure) ([place])   6. Dyspnea   7. Ascending aortic aneurysm (HCC) Active     PLAN:  In order of problems listed above:  CAD: Prior PCI to RCA in  8/12. LHC (2/16) with nonobstructive disease.Trialed on Imdur for  ?microvascular angina but he developed severe headaches without any improvement in symptoms. Continue ASA 81 and Crestor.   Bicuspid aortic valve with mod AS: Bicuspid valve with moderate AS by TEE and TTE. Repeat 2-D echo in 12/2014 showed stable moderate aortic stenosis. We will repeat 2-D echo in 12/2015.  Ascending aortic aneurysm: Associated with bicuspid aortic valve. 4.2 cm ascending aorta on 2/16 TEE. He will need CTA or MRA to follow mild ascending aortic aneurysm in 2/17. This was never done. I will have that arranged.  HLD: continue statin.    Chronic diastolic CHF: he appears euvolemic. Continue current Lasix dosing.   Dyspnea: Ongoing dyspnea. He has moderate AS and nonobstructive coronary disease.Dr. Aundra Dubin did not think that he was significantly volume overloaded. PFTs were within normal limits. It was felt that a lot of his problem had to do with nasal and sinus congestion. He was started on Atrovent and referred to an allergist.  ENT surgery: he will require general anaesthesia. He had a cath in 05/2014 with stable CAD. He has had not had any worsening exertional chest pain and shortness of breath has been stable. He will not be required to do stress testing prior to surgery and is cleared from a cardiac standpoint.    Medication Adjustments/Labs and Tests Ordered: Current medicines are reviewed at length with the patient today.  Concerns regarding medicines are outlined above.  Medication changes, Labs and Tests ordered today are listed in the Patient Instructions below. Patient Instructions  Medication Instructions:  Your physician recommends that you continue on your current medications as directed. Please refer to the Current Medication list given to you today.   Labwork: NONE ORDERED  Testing/Procedures: Your physician has requested that you have cardiac CT. Cardiac computed tomography (CT) is a painless test that uses an x-ray machine to take clear,  detailed pictures of your heart. For further information please visit [facility]. Please follow instruction sheet as given.    Follow-Up: Your physician wants you to follow-up in: [place] DR. Aundra Dubin You will receive a reminder letter in the mail two months in advance. If you don't receive a letter, please call our office to schedule the follow-up appointment.   Any Other Special Instructions Will Be Listed Below (If Applicable).     If you need a refill on your cardiac medications before your next appointment, please call your pharmacy.       Renea Ee  08/12/2015 4:31 PM    [facility] [place], [place], [place]  [address] Phone: [phone]; Fax: [phone]

## 2015-08-12 ENCOUNTER — Encounter: Payer: Self-pay | Admitting: Physician Assistant

## 2015-08-12 ENCOUNTER — Ambulatory Visit (INDEPENDENT_AMBULATORY_CARE_PROVIDER_SITE_OTHER): Payer: BC Managed Care – PPO | Admitting: Physician Assistant

## 2015-08-12 VITALS — BP 120/68 | HR 60 | Ht 66.0 in | Wt 232.0 lb

## 2015-08-12 DIAGNOSIS — I5032 Chronic diastolic (congestive) heart failure: Secondary | ICD-10-CM

## 2015-08-12 DIAGNOSIS — E785 Hyperlipidemia, unspecified: Secondary | ICD-10-CM

## 2015-08-12 DIAGNOSIS — Q231 Congenital insufficiency of aortic valve: Secondary | ICD-10-CM | POA: Diagnosis not present

## 2015-08-12 DIAGNOSIS — R0600 Dyspnea, unspecified: Secondary | ICD-10-CM

## 2015-08-12 DIAGNOSIS — I712 Thoracic aortic aneurysm, without rupture: Secondary | ICD-10-CM

## 2015-08-12 DIAGNOSIS — I7121 Aneurysm of the ascending aorta, without rupture: Secondary | ICD-10-CM

## 2015-08-12 DIAGNOSIS — Q230 Congenital stenosis of aortic valve: Secondary | ICD-10-CM

## 2015-08-12 DIAGNOSIS — I2510 Atherosclerotic heart disease of native coronary artery without angina pectoris: Secondary | ICD-10-CM

## 2015-08-12 LAB — BASIC METABOLIC PANEL
BUN: 16 mg/dL (ref 7–25)
CO2: 30 mmol/L (ref 20–31)
Calcium: 8.8 mg/dL (ref 8.6–10.3)
Chloride: 103 mmol/L (ref 98–110)
Creat: 1.19 mg/dL (ref 0.70–1.25)
Glucose, Bld: 93 mg/dL (ref 65–99)
POTASSIUM: 4.4 mmol/L (ref 3.5–5.3)
SODIUM: 143 mmol/L (ref 135–146)

## 2015-08-12 NOTE — Patient Instructions (Signed)
Medication Instructions:  Your physician recommends that you continue on your current medications as directed. Please refer to the Current Medication list given to you today.   Labwork: NONE ORDERED  Testing/Procedures: Your physician has requested that you have cardiac CT. Cardiac computed tomography (CT) is a painless test that uses an x-ray machine to take clear, detailed pictures of your heart. For further information please visit [facility]tn. Please follow instruction sheet as given.    Follow-Up: Your physician wants you to follow-up in: [place] DR. Aundra Dubin You will receive a reminder letter in the mail two months in advance. If you don't receive a letter, please call our office to schedule the follow-up appointment.   Any Other Special Instructions Will Be Listed Below (If Applicable).     If you need a refill on your cardiac medications before your next appointment, please call your pharmacy.

## 2015-08-17 ENCOUNTER — Ambulatory Visit (INDEPENDENT_AMBULATORY_CARE_PROVIDER_SITE_OTHER)
Admission: RE | Admit: 2015-08-17 | Discharge: 2015-08-17 | Disposition: A | Discharge status: Home / Self Care | Payer: BC Managed Care – PPO | Source: Ambulatory Visit | Attending: Physician Assistant | Admitting: Physician Assistant

## 2015-08-17 DIAGNOSIS — I712 Thoracic aortic aneurysm, without rupture: Secondary | ICD-10-CM | POA: Diagnosis not present

## 2015-08-17 DIAGNOSIS — I7121 Aneurysm of the ascending aorta, without rupture: Secondary | ICD-10-CM

## 2015-08-17 MED ORDER — IOPAMIDOL (ISOVUE-370) INJECTION 76%
100.0000 mL | Freq: Once | INTRAVENOUS | Status: AC | PRN
  Administered 2015-08-17: 100 mL via INTRAVENOUS

## 2015-08-17 NOTE — Addendum Note (Signed)
Addended by: Freada Bergeron on: 08/17/2015 04:40 PM   Modules accepted: Orders

## 2015-08-18 ENCOUNTER — Telehealth: Payer: Self-pay | Admitting: Cardiology

## 2015-08-18 NOTE — Telephone Encounter (Signed)
Follow Up  This is a follow up on the clearance to Dr. Lucia Gaskins. Pt states that Dr. Jennell Corner office are reporting that they have not received that Fax through [place] as of yet. Please assist

## 2015-08-18 NOTE — Telephone Encounter (Signed)
Pt's wife advised I spoke with Susie at Dr [facility] office and surgical clearance has been faxed.

## 2015-08-18 NOTE — Telephone Encounter (Signed)
Susie at Dr Rosina Lowenstein office advised I have faxed Luis [name]'s  note from 08/12/15 with surgical clearance has been faxed to Dr Lucia Gaskins at [phone].

## 2015-08-18 NOTE — Telephone Encounter (Signed)
Follow Up   Mrs. Luis [name] is calling because Dr. Pollie Friar office is telling her that they are not on Epic and the clearance will need to be faxed to them (no fax number given) .[name] Please call if you have any questions   Thanks

## 2015-08-19 ENCOUNTER — Telehealth: Payer: Self-pay | Admitting: *Deleted

## 2015-08-19 DIAGNOSIS — I719 Aortic aneurysm of unspecified site, without rupture: Secondary | ICD-10-CM

## 2015-08-19 NOTE — Telephone Encounter (Signed)
Pt has been made aware of ct angio chest.  He is aware that the anuerysm has gotten a little bigger, but right now we are just gonna closely monitor it in 6 months and see what we need to do.  Pt verbalized understanding.

## 2015-08-19 NOTE — Telephone Encounter (Signed)
-----   Message from Eileen Stanford, [place] sent at 08/19/2015  2:33 PM EDT ----- We are repeating CT bc his aneurysm is enlarging and we need to follow it every 6 months. If it gets any bigger we will have to refer to [facility].

## 2015-08-25 NOTE — Progress Notes (Signed)
Cardiology notes and pt's pmh reviewed by Dr. Veatrice Kells with anesthesia. Pt's surgery with Dr.C Lucia Gaskins to be done at the [facility] not at [facility]. Office notified.

## 2015-08-28 ENCOUNTER — Encounter (HOSPITAL_COMMUNITY): Payer: Self-pay | Admitting: *Deleted

## 2015-08-28 NOTE — Progress Notes (Signed)
Spoke with Dr.Newman's office requesting orders be placed into epic

## 2015-08-30 ENCOUNTER — Other Ambulatory Visit: Payer: Self-pay | Admitting: Otolaryngology

## 2015-08-30 NOTE — H&P (Signed)
PREOPERATIVE H&P  Chief Complaint: chronic nasal obstruction  HPI: Luis [name] is a 60 y.o. male who presents for evaluation of chronic nasal obstruction for years. On exam he has a deviated septum and large turbinates in addition to chronic drainage from the right middle meatus despite several rounds of antibiotics. Recent CT scan last month demonstrated complete opacity of the right maxillary sinus and partial opacity of the right ethmoid sinuses. He's taken to the OR for septoplasty, turbinate reductions and right FESS.  Past Medical History  Diagnosis Date  . Aortic stenosis, moderate 01-02-15    echo 01-02-15  . Carotid stenosis     11/09 dopplers with 0-39% bilat. ICA;   Carotid US (05/2013):  Bilateral 1-39% ICA; L thyroid nodule (prior hx of aspiration) - f/u 2 years.  . History of colonic polyps     hyperplastic  . CAD (coronary artery disease)     s/p PCI of the RCA 8/12 with DES by Dr Burt Knack, preserved EF  . Heart murmur   . Sleep apnea     STOPBANG=5  . Arthritis   . Hyperlipidemia     takes Crestor nightly  . Depression     takes Lexapro daily  . CHF (congestive heart failure) (H[facility])     takes Lasix daily  . Vertigo     takes Meclizine as needed  . Anxiety     takes Valium as needed  . Heart attack [facility])     didn't even know he had one  . Shortness of breath dyspnea   . Restless leg   . Joint pain   . GERD (gastroesophageal reflux disease)     if needed will take OTC meds    Past Surgical History  Procedure Laterality Date  . Tonsillectomy    . Cataract extraction   4 YRS AGO    BOTH EYES  . Right elbow surgery    . Solonscopy  05/23/08    per Dr. Wynona Luna hemorrhoids only, repeat in 5 years  . Neck surgery  03/23/09    per Dr. Lorin Mercy, cervical fusion   . Coronary stenting      s/p PCI of the RCA by Dr Burt Knack 8/12 with 2 promus stents  . Cholecystectomy  07/21/2011    Procedure: LAPAROSCOPIC CHOLECYSTECTOMY WITH INTRAOPERATIVE CHOLANGIOGRAM;   Surgeon: Shann Medal, MD;  Location: [facility];  Service: General;  Laterality: N/A;  . Left and right heart catheterization with coronary angiogram N/A 06/23/2014    Procedure: LEFT AND RIGHT HEART CATHETERIZATION WITH CORONARY ANGIOGRAM;  Surgeon: Larey Dresser, MD;  Location: [facility] CATH LAB;  Service: Cardiovascular;  Laterality: N/A;  . Tee without cardioversion N/A 06/23/2014    Procedure: TRANSESOPHAGEAL ECHOCARDIOGRAM (TEE);  Surgeon: Larey Dresser, MD;  Location: [facility] ENDOSCOPY;  Service: Cardiovascular;  Laterality: N/A;  . Coronary angioplasty      2 stents   Social History   Social History  . Marital Status: Married    Spouse Name: N/A  . Number of Children: N/A  . Years of Education: N/A   Occupational History  . [employer]    Social History Main Topics  . Smoking status: Former Research scientist (life sciences)  . Smokeless tobacco: Never Used     Comment: 5 yrs ago  . Alcohol Use: No  . Drug Use: No  . Sexual Activity: Not Asked   Other Topics Concern  . None   Social History Narrative   Family History  Problem Relation Age  of Onset  . Cancer Other     colon  . Heart disease Other   . Colon cancer Neg Hx   . Esophageal cancer Neg Hx   . Rectal cancer Neg Hx   . Stomach cancer Neg Hx   . Cancer Mother     lung   No Known Allergies Prior to Admission medications   Medication Sig Start Date End Date Taking? Authorizing Provider  aspirin 81 MG tablet Take 81 mg by mouth every morning.    Yes Historical Provider, MD  diazepam (VALIUM) 5 MG tablet Take 1 tablet (5 mg total) by mouth every 8 (eight) hours as needed for anxiety. 01/14/15  Yes Laurey Morale, MD  escitalopram (LEXAPRO) 20 MG tablet Take 1 tablet (20 mg total) by mouth daily. 07/14/15  Yes Laurey Morale, MD  furosemide (LASIX) 40 MG tablet TAKE 1 TABLET (40 MG TOTAL) BY MOUTH DAILY. 07/06/15  Yes Larey Dresser, MD  ibuprofen (ADVIL,MOTRIN) 200 MG tablet Take 200 mg by mouth 2 (two) times daily as needed for moderate pain.     Yes Historical Provider, MD  ipratropium (ATROVENT) 0.03 % nasal spray Place 2 sprays into both nostrils every 12 (twelve) hours. Patient taking differently: Place 2 sprays into both nostrils 2 (two) times daily as needed for rhinitis.  12/24/14  Yes Larey Dresser, MD  KLOR-CON 10 10 MEQ tablet TAKE 1 TABLET BY MOUTH DAILY 01/19/15  Yes Larey Dresser, MD  nitroGLYCERIN (NITROSTAT) 0.4 MG SL tablet Place 1 tablet (0.4 mg total) under the tongue every 5 (five) minutes as needed. For chest pain. 05/22/13 06/23/21 Yes Scott Joylene Draft, PA-C  rosuvastatin (CRESTOR) 20 MG tablet TAKE 1 TABLET BY MOUTH AT BEDTIME 01/19/15  Yes Larey Dresser, MD  meclizine (ANTIVERT) 25 MG tablet Take 1 tablet (25 mg total) by mouth 3 (three) times daily as needed. Patient taking differently: Take 25 mg by mouth 3 (three) times daily as needed for dizziness.  01/11/13   Lucretia Kern, DO     Positive ROS: chronic nasal obstruction, history of OSA but doesn't wear CPAP  All other systems have been reviewed and were otherwise negative with the exception of those mentioned in the HPI and as above.  Physical Exam: There were no vitals filed for this visit.  General: Alert, no acute distress Oral: Normal oral mucosa s/p tonsillectomy Nasal: Septal deviation to the left, large turbinates and thick mucous discharge from right middle meatus Neck: No palpable adenopathy or left thyroid nodule Ear: Ear canal is clear with normal appearing TMs Cardiovascular: Regular rate and rhythm, no murmur.  Respiratory: Clear to auscultation Neurologic: Alert and oriented x 3   Assessment/Plan: chronic sinsus disease, septal deviation, turbinate hypertrophy Plan for Procedure(s): NASAL SEPTOPLASTY WITH TURBINATE REDUCTION, RIGHT FESS WITH RIGHT ETHMOIDECTOMY, AND MAXILLARY ENLARGEMENT   Melony Overly, MD 08/30/2015 10:22 PM

## 2015-08-31 MED ORDER — CEFAZOLIN SODIUM-DEXTROSE 2-4 GM/100ML-% IV SOLN: 2.0000 g | INTRAVENOUS | Status: AC

## 2015-09-01 ENCOUNTER — Ambulatory Visit (HOSPITAL_COMMUNITY): Admission: RE | Admit: 2015-09-01 | Payer: BC Managed Care – PPO | Source: Ambulatory Visit | Admitting: Otolaryngology

## 2015-09-01 HISTORY — DX: Gastro-esophageal reflux disease without esophagitis: K21.9

## 2015-09-01 HISTORY — DX: Pain in unspecified joint: M25.50

## 2015-09-01 HISTORY — DX: Restless legs syndrome: G25.81

## 2015-09-01 HISTORY — DX: Dizziness and giddiness: R42

## 2015-09-01 SURGERY — SEPTOPLASTY, NOSE, WITH NASAL TURBINATE REDUCTION
Anesthesia: General | Laterality: Right

## 2015-09-02 ENCOUNTER — Other Ambulatory Visit: Payer: Self-pay | Admitting: Cardiology

## 2015-10-10 ENCOUNTER — Other Ambulatory Visit: Payer: Self-pay | Admitting: Cardiology

## 2015-11-26 ENCOUNTER — Encounter: Payer: BC Managed Care – PPO | Admitting: Physician Assistant

## 2015-11-26 ENCOUNTER — Encounter: Payer: Self-pay | Admitting: Physician Assistant

## 2015-11-26 ENCOUNTER — Telehealth: Payer: Self-pay | Admitting: Cardiology

## 2015-11-26 DIAGNOSIS — I7121 Aneurysm of the ascending aorta, without rupture: Secondary | ICD-10-CM | POA: Insufficient documentation

## 2015-11-26 DIAGNOSIS — I712 Thoracic aortic aneurysm, without rupture: Secondary | ICD-10-CM | POA: Insufficient documentation

## 2015-11-26 DIAGNOSIS — I10 Essential (primary) hypertension: Secondary | ICD-10-CM | POA: Insufficient documentation

## 2015-11-26 NOTE — Telephone Encounter (Signed)
Closed encounter °

## 2015-11-26 NOTE — Progress Notes (Signed)
Cardiology Office Note    Date:  11/27/2015  ID:  Luis [name], DOB [birth year], MRN [id number] PCP:  Laurey Morale, MD  Cardiologist:  Aundra Dubin   Chief Complaint: leg pain  History of Present Illness:  Luis [name] is a 60 y.o. male with history of CAD s/p DESx2 to RCA (2012), bicuspid AV with mod AS, OSA intolerant of CPAP, ascending aortic aneurysm, chronic diastolic CHF, HTN, HLD who presents to clinic for evaluation of leg pain. Prior evaluation noted below. Last 2D echo 12/2014: mild LVH, EF 60-65%, grade 1 DD, mod aortic stenosis, mild RAE. Last CT 07/2015 Ascending aorta aneurysm enlarging to 4.5cm 07/2015, recommend semi-annual follow-up (due 01/2016). Last BMET unremarkable 07/2015. He was previously started on Imdur for ?microvascular disease and did develop a headache with that. He has had dyspnea felt r/t seasonal allergies in the past. He is supposed to have sinus surgery to help correct this as he has been unable to tolerate CPAP but his family is frustrated because insurance has not yet approved it.  He returns for f/u today at the insistence of his sister and wife. For the past several years he's noted leg pain on ambulation but it seems to be gradually progressive. His legs tend to cramp up easily as well. Although he states he feels "fine," his family insists he hides symptoms until things get really serious - they are concerned that he has very low energy and progressive dyspnea with exertion requiring him to rest. After being confronted about these symptoms he does admit they have progressed over time. He goes to bed at 7pm nightly now which is unusual for him. Denies any CP, LEE, orthopnea, or edema.    Prior studies:  - Cardiolite 8/12 with inferior infarct and peri-infarct ischemia, had PCI to the mid and distal RCA with Promus DES x 2. Lexiscan Cardiolite (2/16) with no ischemia or infarction. LHC/RHC (2/16) with mean RA 12, PA 32/15, mean PCWP 18, CI 3.47; patent mid and  distal RCA stents, 50-60% proximal stenosis small PDA.   - 2/15 carotid dopplers 1-39% bilateral ICA stenosis.  - Echo (2/15) with EF 60-65%, mild LVH, moderate diastolic dysfunction, normal RV size/systolic function, moderate aortic stenosis with mean gradient 31 and AVA 1.16 cm2, ascending aorta 4.4 cm. Echo (2/16) with EF 60-65%, grade II diastolic dysfunction, ?bicuspid aortic valve with moderate AS (mean gradient 35 mmHg, AVA 1.5 cm^2), mildly dilated RV with normal systolic function. TEE (2/16) with bicuspid aortic valve, moderate AS mean gradient 30 mmHg, AVA > 1 cm^2, EF 55-60%, mild LVH, mildly dilated RV with normal systolic function, mild AI, mild MR, ascending aorta 4.2 cm.  - Last echo 12/2014 showed mild LVH EF 60-65%, grade 1 DD, moderate AS, gradients are lower, mild RAE - Ascending aorta aneurysm: 4.4 cm on 2/15 echo. Ascending aorta 4.2 cm TEE 2/16. Enlarging to 4.5cm 07/2015, recommend semi-annual follow-up (due 01/2016)  - PFTs (4/16) were within normal limits.     Past Medical History:  Diagnosis Date  . Anxiety    takes Valium as needed  . Aortic stenosis, moderate   . Arthritis   . Ascending aortic aneurysm ([place])   . CAD (coronary artery disease)    a. s/p PCI of the RCA 8/12 with DES by Dr Burt Knack, preserved EF. b. LHC/RHC (2/16) with mean RA 12, PA 32/15, mean PCWP 18, CI 3.47; patent mid and distal RCA stents, 50-60% proximal stenosis small PDA.   . Carotid stenosis  a. Carotid US (05/2013):  Bilateral 1-39% ICA; L thyroid nodule (prior hx of aspiration).  . Chronic diastolic CHF (congestive heart failure) ([place])   . Depression   . Essential hypertension   . GERD (gastroesophageal reflux disease)    if needed will take OTC meds   . Heart murmur   . History of colonic polyps    hyperplastic  . Hyperlipidemia   . Joint pain   . Restless leg   . Sleep apnea    STOPBANG=5  . Vertigo    takes Meclizine as needed    Past Surgical History:  Procedure  Laterality Date  . CATARACT EXTRACTION   4 YRS AGO   BOTH EYES  . CHOLECYSTECTOMY  07/21/2011   Procedure: LAPAROSCOPIC CHOLECYSTECTOMY WITH INTRAOPERATIVE CHOLANGIOGRAM;  Surgeon: Shann Medal, MD;  Location: [facility];  Service: General;  Laterality: N/A;  . CORONARY ANGIOPLASTY     2 stents  . coronary stenting     s/p PCI of the RCA by Dr Burt Knack 8/12 with 2 promus stents  . LEFT AND RIGHT HEART CATHETERIZATION WITH CORONARY ANGIOGRAM N/A 06/23/2014   Procedure: LEFT AND RIGHT HEART CATHETERIZATION WITH CORONARY ANGIOGRAM;  Surgeon: Larey Dresser, MD;  Location: [facility] CATH LAB;  Service: Cardiovascular;  Laterality: N/A;  . NECK SURGERY  03/23/09   per Dr. Lorin Mercy, cervical fusion   . right elbow surgery    . solonscopy  05/23/08   per Dr. Wynona Luna hemorrhoids only, repeat in 5 years  . TEE WITHOUT CARDIOVERSION N/A 06/23/2014   Procedure: TRANSESOPHAGEAL ECHOCARDIOGRAM (TEE);  Surgeon: Larey Dresser, MD;  Location: [facility];  Service: Cardiovascular;  Laterality: N/A;  . TONSILLECTOMY      Current Medications: Current Outpatient Prescriptions  Medication Sig Dispense Refill  . aspirin 81 MG tablet Take 81 mg by mouth every morning.     . diazepam (VALIUM) 5 MG tablet Take 1 tablet (5 mg total) by mouth every 8 (eight) hours as needed for anxiety. 60 tablet 2  . escitalopram (LEXAPRO) 20 MG tablet Take 1 tablet (20 mg total) by mouth daily. 90 tablet 3  . furosemide (LASIX) 40 MG tablet TAKE 1 TABLET (40 MG TOTAL) BY MOUTH DAILY. 90 tablet 2  . ibuprofen (ADVIL,MOTRIN) 200 MG tablet Take 200 mg by mouth 2 (two) times daily as needed for moderate pain.     [name] ipratropium (ATROVENT) 0.03 % nasal spray Place 2 sprays into both nostrils every 12 (twelve) hours as needed for rhinitis.    [name] KLOR-CON 10 10 MEQ tablet TAKE 1 TABLET BY MOUTH DAILY 30 tablet 11  . meclizine (ANTIVERT) 25 MG tablet Take 25 mg by mouth 3 (three) times daily as needed for dizziness.    . nitroGLYCERIN  (NITROSTAT) 0.4 MG SL tablet Place 1 tablet (0.4 mg total) under the tongue every 5 (five) minutes as needed. For chest pain. 25 tablet 0  . rosuvastatin (CRESTOR) 20 MG tablet TAKE 1 TABLET BY MOUTH AT BEDTIME 30 tablet 11   No current facility-administered medications for this visit.      Allergies:   Review of patient's allergies indicates no known allergies.   Social History   Social History  . Marital status: Married    Spouse name: N/A  . Number of children: N/A  . Years of education: N/A   Occupational History  . [employer] operational officer   Social History Main Topics  . Smoking status: Former Research scientist (life sciences)  .  Smokeless tobacco: Never Used     Comment: 5 yrs ago  . Alcohol use No  . Drug use: No  . Sexual activity: Not Asked   Other Topics Concern  . None   Social History Narrative  . None     Family History:  The patient's family history includes Cancer in his mother and other; Heart disease in his other.   ROS:   Please see the history of present illness.  All other systems are reviewed and otherwise negative.    PHYSICAL EXAM:   VS:  BP 122/64   Pulse 62   Ht 5' 6.5" (1.689 m)   Wt 238 lb 12.8 oz (108.3 kg)   BMI 37.97 kg/m   BMI: Body mass index is 37.97 kg/m. GEN: Well nourished, well developed obese WM in no acute distress  HEENT: normocephalic, atraumatic Neck: no JVD, carotid bruits, or masses Cardiac: RRR, pronounced SEM at RUSB, faint but preserved S2, no rubs or gallops, no edema. Decreased pedal pulse RLE, 2+ pedal pulse LLE. Respiratory:  clear to auscultation bilaterally, normal work of breathing GI: soft, nontender, nondistended, + BS MS: no deformity or atrophy  Skin: warm and dry, no rash Neuro:  Alert and Oriented x 3, Strength and sensation are intact, follows commands Psych: euthymic mood, full affect  Wt Readings from Last 3 Encounters:  11/27/15 238 lb 12.8 oz (108.3 kg)  08/12/15 232 lb (105.2 kg)  02/16/15 234 lb  (106.1 kg)      Studies/Labs Reviewed:   EKG:  EKG was ordered today and personally reviewed by me and demonstrates sinus bradycardia 59bpm, possible prior septal infarct, no acute ST-T changes.  Recent Labs: 08/12/2015: BUN 16; Creat 1.19; Potassium 4.4; Sodium 143   Lipid Panel    Component Value Date/Time   CHOL 161 11/26/2014 0911   TRIG 125.0 11/26/2014 0911   HDL 58.40 11/26/2014 0911   CHOLHDL 3 11/26/2014 0911   VLDL 25.0 11/26/2014 0911   LDLCALC 78 11/26/2014 0911   LDLDIRECT 145.6 03/16/2007 0908    Additional studies/ records that were reviewed today include: Summarized above.    ASSESSMENT & PLAN:   1. Leg pain - proceed with ABIs/LE arterial testing to evaluate for PAD. Also check CK and lytes. 2. Fatigue/DOE - suspect fatigue is partially due to untreated sleep apnea, but dyspnea has progressed. I d/w Dr. Aundra Dubin of how to proceed - will start with 2D echo to further evaluate aortic stenosis. Will also check basic labs. 3. CAD - no recent chest pain. If above testing is unrevealing would consider updating ischemic eval. 4. Moderate aortic stenosis - as above, repeat echocardiogram to exclude progression. Significant murmur noted on exam. 5. PVD including ascending aortic aneurysm and carotid artery disease - per d/w Dr. Aundra Dubin, will proceed with updated CTA to track progression. Will defer timing of carotid duplex f/u to MD (1-39% in 2015). 6. HTN - continue present regimen.  Disposition: F/u with Dr. Aundra Dubin soon per him - our nurse will be sending his nurse a message to help work him in.   Medication Adjustments/Labs and Tests Ordered: Current medicines are reviewed at length with the patient today.  Concerns regarding medicines are outlined above. Medication changes, Labs and Tests ordered today are summarized above and listed in the Patient Instructions accessible in Encounters.   Raechel Ache PA-C  11/27/2015 2:11 PM    [facility]  [facility] [address], [place], [place]  [address] Phone: [phone]; Fax: [phone]

## 2015-11-26 NOTE — Progress Notes (Signed)
Scheduling meant to put this patient on for another day.    This encounter was created in error - please disregard.

## 2015-11-27 ENCOUNTER — Ambulatory Visit (INDEPENDENT_AMBULATORY_CARE_PROVIDER_SITE_OTHER): Payer: BC Managed Care – PPO | Admitting: Physician Assistant

## 2015-11-27 ENCOUNTER — Encounter: Payer: Self-pay | Admitting: Physician Assistant

## 2015-11-27 VITALS — BP 122/64 | HR 62 | Ht 66.5 in | Wt 238.8 lb

## 2015-11-27 DIAGNOSIS — M79605 Pain in left leg: Secondary | ICD-10-CM

## 2015-11-27 DIAGNOSIS — M79606 Pain in leg, unspecified: Secondary | ICD-10-CM

## 2015-11-27 DIAGNOSIS — I2510 Atherosclerotic heart disease of native coronary artery without angina pectoris: Secondary | ICD-10-CM | POA: Diagnosis not present

## 2015-11-27 DIAGNOSIS — M79604 Pain in right leg: Secondary | ICD-10-CM | POA: Diagnosis not present

## 2015-11-27 DIAGNOSIS — I10 Essential (primary) hypertension: Secondary | ICD-10-CM

## 2015-11-27 DIAGNOSIS — R5382 Chronic fatigue, unspecified: Secondary | ICD-10-CM

## 2015-11-27 DIAGNOSIS — R5383 Other fatigue: Secondary | ICD-10-CM | POA: Diagnosis not present

## 2015-11-27 DIAGNOSIS — I350 Nonrheumatic aortic (valve) stenosis: Secondary | ICD-10-CM

## 2015-11-27 DIAGNOSIS — I739 Peripheral vascular disease, unspecified: Secondary | ICD-10-CM | POA: Diagnosis not present

## 2015-11-27 DIAGNOSIS — I719 Aortic aneurysm of unspecified site, without rupture: Secondary | ICD-10-CM

## 2015-11-27 DIAGNOSIS — R0609 Other forms of dyspnea: Secondary | ICD-10-CM

## 2015-11-27 LAB — CBC
HEMATOCRIT: 40.1 % (ref 38.5–50.0)
HEMOGLOBIN: 13.4 g/dL (ref 13.2–17.1)
MCH: 29.8 pg (ref 27.0–33.0)
MCHC: 33.4 g/dL (ref 32.0–36.0)
MCV: 89.1 fL (ref 80.0–100.0)
MPV: 10 fL (ref 7.5–12.5)
Platelets: 168 10*3/uL (ref 140–400)
RBC: 4.5 MIL/uL (ref 4.20–5.80)
RDW: 14.3 % (ref 11.0–15.0)
WBC: 8.2 10*3/uL (ref 3.8–10.8)

## 2015-11-27 LAB — CK: Total CK: 125 U/L (ref 7–232)

## 2015-11-27 LAB — BASIC METABOLIC PANEL
BUN: 15 mg/dL (ref 7–25)
CALCIUM: 8.4 mg/dL — AB (ref 8.6–10.3)
CHLORIDE: 102 mmol/L (ref 98–110)
CO2: 25 mmol/L (ref 20–31)
Creat: 1.05 mg/dL (ref 0.70–1.25)
GLUCOSE: 145 mg/dL — AB (ref 65–99)
POTASSIUM: 4.3 mmol/L (ref 3.5–5.3)
SODIUM: 139 mmol/L (ref 135–146)

## 2015-11-27 LAB — MAGNESIUM: Magnesium: 2.1 mg/dL (ref 1.5–2.5)

## 2015-11-27 LAB — TSH: TSH: 0.58 mIU/L (ref 0.40–4.50)

## 2015-11-27 MED ORDER — NITROGLYCERIN 0.4 MG SL SUBL: 0.4000 mg | SUBLINGUAL_TABLET | SUBLINGUAL | 0 refills | Status: DC | PRN

## 2015-11-27 NOTE — Patient Instructions (Addendum)
Medication Instructions:  Your physician recommends that you continue on your current medications as directed. Please refer to the Current Medication list given to you today.   Labwork: TODAY:  BMET, CK, MAG, CBC, BNP, & TSH  Testing/Procedures: Your physician has requested that you have a lower extremity arterial exercise duplex. During this test, exercise and ultrasound are used to evaluate arterial blood flow in the legs. Allow one hour for this exam. There are no restrictions or special instructions.  Your physician has requested that you have an echocardiogram. Echocardiography is a painless test that uses sound waves to create images of your heart. It provides your doctor with information about the size and shape of your heart and how well your heart's chambers and valves are working. This procedure takes approximately one hour. There are no restrictions for this procedure.  Non-Cardiac CT Angiography (CTA), is a special type of CT scan that uses a computer to produce multi-dimensional views of major blood vessels throughout the body. In CT angiography, a contrast material is injected through an IV to help visualize the blood vessels   Follow-Up: Your physician recommends that you schedule a follow-up appointment in: 01/04/16 @ 1:30 WITH DR. Aundra Dubin   Any Other Special Instructions Will Be Listed Below (If Applicable). Echocardiogram An echocardiogram, or echocardiography, uses sound waves (ultrasound) to produce an image of your heart. The echocardiogram is simple, painless, obtained within a short period of time, and offers valuable information to your health care provider. The images from an echocardiogram can provide information such as:  Evidence of coronary artery disease (CAD).  Heart size.  Heart muscle function.  Heart valve function.  Aneurysm detection.  Evidence of a past heart attack.  Fluid buildup around the heart.  Heart muscle thickening.  Assess heart  valve function. LET [facility] CARE PROVIDER KNOW ABOUT:  Any allergies you have.  All medicines you are taking, including vitamins, herbs, eye drops, creams, and over-the-counter medicines.  Previous problems you or members of your family have had with the use of anesthetics.  Any blood disorders you have.  Previous surgeries you have had.  Medical conditions you have.  Possibility of pregnancy, if this applies. BEFORE THE PROCEDURE  No special preparation is needed. Eat and drink normally.  PROCEDURE   In order to produce an image of your heart, gel will be applied to your chest and a wand-like tool (transducer) will be moved over your chest. The gel will help transmit the sound waves from the transducer. The sound waves will harmlessly bounce off your heart to allow the heart images to be captured in real-time motion. These images will then be recorded.  You may need an IV to receive a medicine that improves the quality of the pictures. AFTER THE PROCEDURE You may return to your normal schedule including diet, activities, and medicines, unless your health care provider tells you otherwise.   This information is not intended to replace advice given to you by your health care provider. Make sure you discuss any questions you have with your health care provider.   Document Released: 04/08/2000 Document Revised: 05/02/2014 Document Reviewed: 12/17/2012 Elsevier Interactive Patient Education [employer].    If you need a refill on your cardiac medications before your next appointment, please call your pharmacy.

## 2015-11-28 LAB — BRAIN NATRIURETIC PEPTIDE: Brain Natriuretic Peptide: 55.6 pg/mL (ref <100)

## 2015-11-30 ENCOUNTER — Other Ambulatory Visit: Payer: Self-pay | Admitting: Physician Assistant

## 2015-11-30 DIAGNOSIS — I739 Peripheral vascular disease, unspecified: Secondary | ICD-10-CM

## 2015-12-07 ENCOUNTER — Ambulatory Visit (HOSPITAL_COMMUNITY)
Admission: RE | Admit: 2015-12-07 | Discharge: 2015-12-07 | Disposition: A | Discharge status: Home / Self Care | Payer: BC Managed Care – PPO | Source: Ambulatory Visit | Attending: Physician Assistant | Admitting: Physician Assistant

## 2015-12-07 DIAGNOSIS — I739 Peripheral vascular disease, unspecified: Secondary | ICD-10-CM | POA: Insufficient documentation

## 2015-12-07 DIAGNOSIS — I2510 Atherosclerotic heart disease of native coronary artery without angina pectoris: Secondary | ICD-10-CM | POA: Insufficient documentation

## 2015-12-07 DIAGNOSIS — K219 Gastro-esophageal reflux disease without esophagitis: Secondary | ICD-10-CM | POA: Diagnosis not present

## 2015-12-07 DIAGNOSIS — R5383 Other fatigue: Secondary | ICD-10-CM

## 2015-12-07 DIAGNOSIS — F419 Anxiety disorder, unspecified: Secondary | ICD-10-CM | POA: Insufficient documentation

## 2015-12-07 DIAGNOSIS — M79604 Pain in right leg: Secondary | ICD-10-CM

## 2015-12-07 DIAGNOSIS — M79605 Pain in left leg: Secondary | ICD-10-CM

## 2015-12-07 DIAGNOSIS — E785 Hyperlipidemia, unspecified: Secondary | ICD-10-CM | POA: Diagnosis not present

## 2015-12-07 DIAGNOSIS — I110 Hypertensive heart disease with heart failure: Secondary | ICD-10-CM | POA: Diagnosis not present

## 2015-12-07 DIAGNOSIS — I5032 Chronic diastolic (congestive) heart failure: Secondary | ICD-10-CM | POA: Insufficient documentation

## 2015-12-07 DIAGNOSIS — F329 Major depressive disorder, single episode, unspecified: Secondary | ICD-10-CM | POA: Insufficient documentation

## 2015-12-08 ENCOUNTER — Ambulatory Visit (HOSPITAL_COMMUNITY): Payer: BC Managed Care – PPO | Attending: Cardiovascular Disease

## 2015-12-08 ENCOUNTER — Other Ambulatory Visit: Payer: Self-pay

## 2015-12-08 ENCOUNTER — Ambulatory Visit (INDEPENDENT_AMBULATORY_CARE_PROVIDER_SITE_OTHER)
Admission: RE | Admit: 2015-12-08 | Discharge: 2015-12-08 | Disposition: A | Discharge status: Home / Self Care | Payer: BC Managed Care – PPO | Source: Ambulatory Visit | Attending: Physician Assistant | Admitting: Physician Assistant

## 2015-12-08 DIAGNOSIS — E785 Hyperlipidemia, unspecified: Secondary | ICD-10-CM | POA: Insufficient documentation

## 2015-12-08 DIAGNOSIS — I509 Heart failure, unspecified: Secondary | ICD-10-CM | POA: Insufficient documentation

## 2015-12-08 DIAGNOSIS — I2510 Atherosclerotic heart disease of native coronary artery without angina pectoris: Secondary | ICD-10-CM | POA: Insufficient documentation

## 2015-12-08 DIAGNOSIS — Z87891 Personal history of nicotine dependence: Secondary | ICD-10-CM | POA: Diagnosis not present

## 2015-12-08 DIAGNOSIS — I350 Nonrheumatic aortic (valve) stenosis: Secondary | ICD-10-CM | POA: Insufficient documentation

## 2015-12-08 DIAGNOSIS — I719 Aortic aneurysm of unspecified site, without rupture: Secondary | ICD-10-CM

## 2015-12-08 DIAGNOSIS — I110 Hypertensive heart disease with heart failure: Secondary | ICD-10-CM | POA: Insufficient documentation

## 2015-12-08 DIAGNOSIS — G4733 Obstructive sleep apnea (adult) (pediatric): Secondary | ICD-10-CM | POA: Diagnosis not present

## 2015-12-08 DIAGNOSIS — Z8249 Family history of ischemic heart disease and other diseases of the circulatory system: Secondary | ICD-10-CM | POA: Insufficient documentation

## 2015-12-08 DIAGNOSIS — I712 Thoracic aortic aneurysm, without rupture: Secondary | ICD-10-CM | POA: Insufficient documentation

## 2015-12-08 MED ORDER — IOPAMIDOL (ISOVUE-370) INJECTION 76%
100.0000 mL | Freq: Once | INTRAVENOUS | Status: AC | PRN
  Administered 2015-12-08: 100 mL via INTRAVENOUS

## 2015-12-09 ENCOUNTER — Telehealth: Payer: Self-pay | Admitting: Cardiology

## 2015-12-09 NOTE — Telephone Encounter (Signed)
Walk in pt form-Application for Disability Parking Placard-dropped off-Luis [name] and Dr.McLean back Friday will give to them then.

## 2015-12-11 ENCOUNTER — Telehealth: Payer: Self-pay | Admitting: Physician Assistant

## 2015-12-11 DIAGNOSIS — R0600 Dyspnea, unspecified: Secondary | ICD-10-CM

## 2015-12-11 DIAGNOSIS — M79606 Pain in leg, unspecified: Secondary | ICD-10-CM

## 2015-12-11 NOTE — Telephone Encounter (Signed)
New message      Pt decided to have stress test.  Please call

## 2015-12-11 NOTE — Telephone Encounter (Signed)
-----   Message from Luis [name], [place] sent at 12/10/2015 12:28 PM EDT ----- Please let patient know echocardiogram is generally unrevealing, aortic stenosis is moderate. He has diastolic dysfunction but most recent BNP was totally normal. At this point would recommend Lexiscan nuclear stress test to exclude blockage as the cause of his symptoms if he is feeling the same as he was in the office recently. Will plan to keep f/u with Dr. Aundra Dubin in September to go over whether or not he needs a closer look at his heart valve. Dayna Dunn PA-C

## 2015-12-15 ENCOUNTER — Telehealth (HOSPITAL_COMMUNITY): Payer: Self-pay | Admitting: *Deleted

## 2015-12-15 NOTE — Telephone Encounter (Signed)
Patient given detailed instructions per Myocardial Perfusion Study Information Sheet for the test on 12/18/15 at 0715. Patient notified to arrive 15 minutes early and that it is imperative to arrive on time for appointment to keep from having the test rescheduled.  If you need to cancel or reschedule your appointment, please call the office within 24 hours of your appointment. Failure to do so may result in a cancellation of your appointment, and a $50 no show fee. Patient verbalized understanding.Lowery Paullin, Ranae [place]ms

## 2015-12-18 ENCOUNTER — Ambulatory Visit (HOSPITAL_COMMUNITY): Payer: BC Managed Care – PPO | Attending: Cardiology

## 2015-12-18 DIAGNOSIS — R0600 Dyspnea, unspecified: Secondary | ICD-10-CM

## 2015-12-18 DIAGNOSIS — I10 Essential (primary) hypertension: Secondary | ICD-10-CM | POA: Insufficient documentation

## 2015-12-18 DIAGNOSIS — M79606 Pain in leg, unspecified: Secondary | ICD-10-CM | POA: Diagnosis not present

## 2015-12-18 DIAGNOSIS — I779 Disorder of arteries and arterioles, unspecified: Secondary | ICD-10-CM | POA: Insufficient documentation

## 2015-12-18 DIAGNOSIS — R0609 Other forms of dyspnea: Secondary | ICD-10-CM | POA: Insufficient documentation

## 2015-12-18 MED ORDER — TECHNETIUM TC 99M TETROFOSMIN IV KIT
31.7000 | PACK | Freq: Once | INTRAVENOUS | Status: AC | PRN
  Administered 2015-12-18: 31.7 via INTRAVENOUS
  Filled 2015-12-18: qty 32

## 2015-12-18 MED ORDER — REGADENOSON 0.4 MG/5ML IV SOLN
0.4000 mg | Freq: Once | INTRAVENOUS | Status: AC
  Administered 2015-12-18: 0.4 mg via INTRAVENOUS

## 2015-12-18 MED ORDER — TECHNETIUM TC 99M TETROFOSMIN IV KIT
11.0000 | PACK | Freq: Once | INTRAVENOUS | Status: AC | PRN
  Administered 2015-12-18: 11 via INTRAVENOUS
  Filled 2015-12-18: qty 11

## 2015-12-20 LAB — MYOCARDIAL PERFUSION IMAGING
CHL CUP NUCLEAR SRS: 1
CHL CUP NUCLEAR SSS: 2
CHL CUP RESTING HR STRESS: 49 {beats}/min
CSEPPHR: 78 {beats}/min
LV sys vol: 65 mL
LVDIAVOL: 147 mL (ref 62–150)
RATE: 0.32
SDS: 1
TID: 1.13

## 2016-01-04 ENCOUNTER — Ambulatory Visit: Payer: BC Managed Care – PPO | Admitting: Cardiology

## 2016-01-06 ENCOUNTER — Telehealth: Payer: Self-pay | Admitting: Cardiology

## 2016-01-06 ENCOUNTER — Encounter: Payer: Self-pay | Admitting: Cardiology

## 2016-01-06 NOTE — Telephone Encounter (Signed)
New Message  Pts wife voiced she needs sooner appt than 12.15.17 which was offered and declined.  Pts wife voiced pt has issues and needs to be seen by MD only and no APP/PA.  Pts wife declined an APP/PA when offered.  Please f/u with pt.

## 2016-01-06 NOTE — Telephone Encounter (Signed)
Wife calling stating Luis [name]'s app w/Dr. Aundra Dubin was cancelled [facility] by office and wants to resch.  States he is still having problems with his feet and is SOB with any activity. Was told app was in fu after tests were completed.  Rescheduled with Dayna Dunn,PA on 9/26.  She is agreeable with this time and day.

## 2016-01-14 ENCOUNTER — Encounter: Payer: Self-pay | Admitting: *Deleted

## 2016-01-14 ENCOUNTER — Ambulatory Visit (INDEPENDENT_AMBULATORY_CARE_PROVIDER_SITE_OTHER): Payer: BC Managed Care – PPO | Admitting: Cardiology

## 2016-01-14 ENCOUNTER — Encounter: Payer: Self-pay | Admitting: Cardiology

## 2016-01-14 VITALS — BP 130/80 | HR 62 | Ht 66.5 in | Wt 238.0 lb

## 2016-01-14 DIAGNOSIS — R0600 Dyspnea, unspecified: Secondary | ICD-10-CM | POA: Diagnosis not present

## 2016-01-14 DIAGNOSIS — R0602 Shortness of breath: Secondary | ICD-10-CM | POA: Diagnosis not present

## 2016-01-14 DIAGNOSIS — G4730 Sleep apnea, unspecified: Secondary | ICD-10-CM

## 2016-01-14 DIAGNOSIS — R5383 Other fatigue: Secondary | ICD-10-CM

## 2016-01-14 DIAGNOSIS — I350 Nonrheumatic aortic (valve) stenosis: Secondary | ICD-10-CM | POA: Diagnosis not present

## 2016-01-14 DIAGNOSIS — I2510 Atherosclerotic heart disease of native coronary artery without angina pectoris: Secondary | ICD-10-CM

## 2016-01-14 LAB — CBC WITH DIFFERENTIAL/PLATELET
Basophils Absolute: 0 cells/uL (ref 0–200)
Basophils Relative: 0 %
EOS PCT: 7 %
Eosinophils Absolute: 546 cells/uL — ABNORMAL HIGH (ref 15–500)
HCT: 40.7 % (ref 38.5–50.0)
HEMOGLOBIN: 13.5 g/dL (ref 13.2–17.1)
LYMPHS ABS: 2106 {cells}/uL (ref 850–3900)
Lymphocytes Relative: 27 %
MCH: 28.9 pg (ref 27.0–33.0)
MCHC: 33.2 g/dL (ref 32.0–36.0)
MCV: 87.2 fL (ref 80.0–100.0)
MPV: 11.2 fL (ref 7.5–12.5)
Monocytes Absolute: 624 cells/uL (ref 200–950)
Monocytes Relative: 8 %
NEUTROS PCT: 58 %
Neutro Abs: 4524 cells/uL (ref 1500–7800)
Platelets: 184 10*3/uL (ref 140–400)
RBC: 4.67 MIL/uL (ref 4.20–5.80)
RDW: 14.8 % (ref 11.0–15.0)
WBC: 7.8 10*3/uL (ref 3.8–10.8)

## 2016-01-14 LAB — BASIC METABOLIC PANEL
BUN: 17 mg/dL (ref 7–25)
CALCIUM: 8.8 mg/dL (ref 8.6–10.3)
CO2: 26 mmol/L (ref 20–31)
Chloride: 102 mmol/L (ref 98–110)
Creat: 1.08 mg/dL (ref 0.70–1.25)
GLUCOSE: 113 mg/dL — AB (ref 65–99)
Potassium: 4 mmol/L (ref 3.5–5.3)
SODIUM: 140 mmol/L (ref 135–146)

## 2016-01-14 NOTE — Patient Instructions (Addendum)
Medication Instructions:  Your physician recommends that you continue on your current medications as directed. Please refer to the Current Medication list given to you today.   Labwork: BMET/CBCd/PT/INR today  Testing/Procedures: Your physician has requested that you have a TEE. During a TEE, sound waves are used to create images of your heart. It provides your doctor with information about the size and shape of your heart and how well your heart's chambers and valves are working. In this test, a transducer is attached to the end of a flexible tube that's guided down your throat and into your esophagus (the tube leading from you mouth to your stomach) to get a more detailed image of your heart. You are not awake for the procedure. Please see the instruction sheet given to you today. For further information please visit [email or web address]. Thursday September 28,2017  Your physician has requested that you have a right and left cardiac catheterization. Cardiac catheterization is used to diagnose and/or treat various heart conditions. Doctors may recommend this procedure for a number of different reasons. The most common reason is to evaluate chest pain. Chest pain can be a symptom of coronary artery disease (CAD), and cardiac catheterization can show whether plaque is narrowing or blocking your heart's arteries. This procedure is also used to evaluate the valves, as well as measure the blood flow and oxygen levels in different parts of your heart. For further information please visit [email or web address]. Please follow instruction sheet, as given. Thursday September 28,2017 following the TEE.  I spoke with Delilah Shan in [employer], booking number [phone]. I spoke with Santiago Glad in the Cardiac Cath Lab. In Basket message to prior authorization.   Follow-Up: Your physician recommends that you schedule a follow-up appointment with PA/NP in 3-4 weeks.  Your physician recommends that you schedule an  appointment with Dr Fransico Him for evaluation and management of sleep apnea.         If you need a refill on your cardiac medications before your next appointment, please call your pharmacy.

## 2016-01-15 LAB — PROTIME-INR
INR: 1
PROTHROMBIN TIME: 10.2 s (ref 9.0–11.5)

## 2016-01-15 NOTE — Progress Notes (Signed)
Patient ID: Luis [name], male   DOB: 1956/03/21, 60 y.o.   MRN: [id number] PCP: Dr. Sarajane Jews  60 y.o. with history of CAD and moderate aortic stenosis presents for cardiology followup. He had RCA PCI in 8/12.     When I saw him in early 2016, he reported significant exertional dyspnea.  I had him do a Lexiscan Cardiolite in 2/16.  This showed no ischemia or infarction.  I also had him get an echo that showed ?bicuspid aortic valve and probably moderate aortic stenosis.  Given ongoing symptoms, I had him do a RHC/LHC and TEE in 2/16.  The RHC showed mildly elevated left and right heart filling pressures and the LHC showed nonobstructive CAD.  TEE confirmed bicuspid aortic valve with moderate AS.  He had PFTs in 4/16 that were within normal limits.  I started him on Lasix 20 mg daily.  He seemed to improve.    However, over the last few months he has again developed significant exertional dyspnea.  His wife and sister are very worried about him.  He has been short of breath with any moderate activity and has to take frequent rest breaks.  He is short of breath just going out to feed the dogs.  Still working full time.  No energy.  Sleepy during the day (not using CPAP).  No syncope/lightheadedness.  No orthopnea/PND.  No chest pain. He was seen by the PA recently with these complaints.  Echo 8/17 showed preserved EF with probably moderate aortic stenosis.  Cardiolite did not show evidence for ischemia or infarction.  Symptoms are ongoing despite the normal studies and are slowly progressive.   Labs (2/15): K 4.4, creatinine 0.9, LDL 71, HDL 53 Labs (2/16): K 4.8, creatinine 1.0, LDL 62, HDL 57 Labs (8/16): K 4.7, creatinine 0.95, TSH normal, HCT 41.2, LDL 78, HDL 58 Labs (8/17): K 4.3, creatinine 1.05, HCT 40.1, TSH normal, BNP 56  PMH: 1. OSA: Not using CPAP.  2. CAD: Cardiolite 8/12 with inferior infarct and peri-infarct ischemia, had PCI to the mid and distal RCA with Promus DES x 2.  Lexiscan  Cardiolite (2/16) with no ischemia or infarction. LHC/RHC (2/16) with mean RA 12, PA 32/15, mean PCWP 18, CI 3.47; patent mid and distal RCA stents, 50-60% proximal stenosis small PDA.   - Cardiolite (8/17): EF 56%, normal.  3. Carotid stenosis: 2/15 carotid dopplers 1-39% bilateral ICA stenosis.  4. Colon polyps 5. HTN 6. Hyperlipidemia 7. Abdominal US (10/12) with no AAA 8. Aortic stenosis: Echo (2/15) with EF 60-65%, mild LVH, moderate diastolic dysfunction, normal RV size/systolic function, moderate aortic stenosis with mean gradient 31 and AVA 1.16 cm2, ascending aorta 4.4 cm.  Echo (2/16) with EF 60-65%, grade II diastolic dysfunction, ?bicuspid aortic valve with moderate AS (mean gradient 35 mmHg, AVA 1.5 cm^2), mildly dilated RV with normal systolic function.  TEE (2/16) with bicuspid aortic valve, moderate AS mean gradient 30 mmHg, AVA > 1 cm^2, EF 55-60%, mild LVH, mildly dilated RV with normal systolic function, mild AI, mild MR, ascending aorta 4.2 cm.  - Echo (8/17): EF 60-65%, grade II diastolic dysfunction, bicuspid aortic valve with mean gradient 31 mmHg, probably moderate stenosis.  9. Ascending aorta aneurysm: 4.4 cm on 2/15 echo. Ascending aorta 4.2 cm TEE 2/16.  10. Chronic diastolic CHF.  11. PFTs (4/16) were within normal limits.  12. Lower extremity arterial dopplers normal in 8/17.   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  Sister with RyR2 gene.   ROS: All systems reviewed and negative except as per HPI.   Current Outpatient Prescriptions  Medication Sig Dispense Refill  . aspirin 81 MG tablet Take 81 mg by mouth every morning.     . diazepam (VALIUM) 5 MG tablet Take 1 tablet (5 mg total) by mouth every 8 (eight) hours as needed for anxiety. 60 tablet 2  . escitalopram (LEXAPRO) 20 MG tablet Take 1 tablet (20 mg total) by mouth daily. 90 tablet 3  . furosemide (LASIX) 40 MG tablet TAKE 1 TABLET (40 MG TOTAL) BY MOUTH DAILY. 90  tablet 2  . ibuprofen (ADVIL,MOTRIN) 200 MG tablet Take 200 mg by mouth 2 (two) times daily as needed for moderate pain.     [name] ipratropium (ATROVENT) 0.03 % nasal spray Place 2 sprays into both nostrils every 12 (twelve) hours as needed for rhinitis.    [name] KLOR-CON 10 10 MEQ tablet TAKE 1 TABLET BY MOUTH DAILY 30 tablet 11  . meclizine (ANTIVERT) 25 MG tablet Take 25 mg by mouth 3 (three) times daily as needed for dizziness.    . nitroGLYCERIN (NITROSTAT) 0.4 MG SL tablet Place 1 tablet (0.4 mg total) under the tongue every 5 (five) minutes as needed. For chest pain. 25 tablet 0  . rosuvastatin (CRESTOR) 20 MG tablet TAKE 1 TABLET BY MOUTH AT BEDTIME 30 tablet 11   No current facility-administered medications for this visit.    BP 130/80   Pulse 62   Ht 5' 6.5" (1.689 m)   Wt 238 lb (108 kg)   BMI 37.84 kg/m  General: NAD Neck: Thick, no JVD, no thyromegaly or thyroid nodule.  Lungs: Clear to auscultation bilaterally with normal respiratory effort. CV: Nondisplaced PMI.  Heart regular S1/S2, no [id number], 3/6 systolic crescendo-decrescendo murmur RUSB with somewhat muffled S2.  1+ ankle edema.  No carotid bruit.  Normal pedal pulses.  Abdomen: Soft, nontender, no hepatosplenomegaly, no distention.  Skin: Intact without lesions or rashes.  Neurologic: Alert and oriented x 3.  Psych: Normal affect. Extremities: No clubbing or cyanosis.  HEENT: Normal.   Assessment/Plan: 1. CAD: Prior PCI to RCA in 8/12.  LHC (2/16) with nonobstructive disease.  I had started him on Imdur for ?microvascular angina but he developed severe headaches without any improvement in symptoms.  Cardiolite in 8/17 with no evidence for ischemia/infarction.  He continues to have significant exertional symptoms, they are progressive. He and his family are very worried about his symptoms. PFTs were normal within the last year.   - We discussed options to further investigate his dyspnea.  We decided that the best option at this  point with ongoing exertional symptoms that seem to be worsening would be right and left heart catheterization.  We discussed risks/benefits and he agrees to proceed.  - Continue ASA 81 and Crestor.  2. Aortic stenosis: Bicuspid valve with moderate AS by echo 8/17. Given ongoing severe dyspnea, I will repeat TEE on the day of his cath to more closely investigate the significance of his aortic stenosis.  We discussed risks/benefits of TEE today and he agreed to proceed.   3. Hyperlipidemia: Good LDL on current statin dose.  4. Ascending aortic aneurysm: Associated with bicuspid aortic valve.  4.2 cm ascending aorta on 2/16 TEE.  Reassess with TEE. 5. Chronic diastolic CHF:  Lasix has not really helped his symptoms.  He does not appear markedly volume overloaded. Will continue current dose.  6.  OSA: Untreated currently.  I think that a lot of his daytime sleepiness/fatigue may be due to OSA.  I am going to refer him back to sleep medicine for re-evaluation.     [name] 01/15/2016

## 2016-01-19 ENCOUNTER — Ambulatory Visit: Payer: BC Managed Care – PPO | Admitting: Physician Assistant

## 2016-01-21 ENCOUNTER — Encounter (HOSPITAL_COMMUNITY): Payer: Self-pay | Admitting: *Deleted

## 2016-01-21 ENCOUNTER — Encounter (HOSPITAL_COMMUNITY)
Admission: RE | Disposition: A | Discharge status: Home / Self Care | Payer: Self-pay | Source: Ambulatory Visit | Attending: Cardiology

## 2016-01-21 ENCOUNTER — Ambulatory Visit (HOSPITAL_COMMUNITY)
Admission: RE | Admit: 2016-01-21 | Discharge: 2016-01-21 | Disposition: A | Discharge status: Home / Self Care | Payer: BC Managed Care – PPO | Source: Ambulatory Visit | Attending: Cardiology | Admitting: Cardiology

## 2016-01-21 ENCOUNTER — Other Ambulatory Visit (HOSPITAL_COMMUNITY): Payer: Self-pay | Admitting: Cardiology

## 2016-01-21 DIAGNOSIS — Z87891 Personal history of nicotine dependence: Secondary | ICD-10-CM | POA: Insufficient documentation

## 2016-01-21 DIAGNOSIS — Z955 Presence of coronary angioplasty implant and graft: Secondary | ICD-10-CM | POA: Insufficient documentation

## 2016-01-21 DIAGNOSIS — I6523 Occlusion and stenosis of bilateral carotid arteries: Secondary | ICD-10-CM | POA: Insufficient documentation

## 2016-01-21 DIAGNOSIS — E785 Hyperlipidemia, unspecified: Secondary | ICD-10-CM | POA: Insufficient documentation

## 2016-01-21 DIAGNOSIS — Z86010 Personal history of colonic polyps: Secondary | ICD-10-CM | POA: Diagnosis not present

## 2016-01-21 DIAGNOSIS — I5032 Chronic diastolic (congestive) heart failure: Secondary | ICD-10-CM | POA: Insufficient documentation

## 2016-01-21 DIAGNOSIS — R0609 Other forms of dyspnea: Secondary | ICD-10-CM | POA: Insufficient documentation

## 2016-01-21 DIAGNOSIS — R0600 Dyspnea, unspecified: Secondary | ICD-10-CM | POA: Diagnosis not present

## 2016-01-21 DIAGNOSIS — G4733 Obstructive sleep apnea (adult) (pediatric): Secondary | ICD-10-CM | POA: Insufficient documentation

## 2016-01-21 DIAGNOSIS — I350 Nonrheumatic aortic (valve) stenosis: Secondary | ICD-10-CM

## 2016-01-21 DIAGNOSIS — Z7982 Long term (current) use of aspirin: Secondary | ICD-10-CM | POA: Diagnosis not present

## 2016-01-21 DIAGNOSIS — I2510 Atherosclerotic heart disease of native coronary artery without angina pectoris: Secondary | ICD-10-CM | POA: Diagnosis not present

## 2016-01-21 DIAGNOSIS — I110 Hypertensive heart disease with heart failure: Secondary | ICD-10-CM | POA: Insufficient documentation

## 2016-01-21 DIAGNOSIS — I712 Thoracic aortic aneurysm, without rupture: Secondary | ICD-10-CM | POA: Diagnosis not present

## 2016-01-21 HISTORY — PX: TEE WITHOUT CARDIOVERSION: SHX5443

## 2016-01-21 LAB — POCT I-STAT 3, VENOUS BLOOD GAS (G3P V)
Acid-Base Excess: 1 mmol/L (ref 0.0–2.0)
Bicarbonate: 27.8 mmol/L (ref 20.0–28.0)
O2 Saturation: 66 %
PCO2 VEN: 55 mmHg (ref 44.0–60.0)
PH VEN: 7.312 (ref 7.250–7.430)
TCO2: 29 mmol/L (ref 0–100)
pO2, Ven: 38 mmHg (ref 32.0–45.0)

## 2016-01-21 SURGERY — RIGHT HEART CATH AND CORONARY ANGIOGRAPHY

## 2016-01-21 SURGERY — ECHOCARDIOGRAM, TRANSESOPHAGEAL
Anesthesia: Moderate Sedation

## 2016-01-21 MED ORDER — HEPARIN (PORCINE) IN NACL 2-0.9 UNIT/ML-% IJ SOLN
INTRAMUSCULAR | Status: AC
  Filled 2016-01-21: qty 1000

## 2016-01-21 MED ORDER — IOPAMIDOL (ISOVUE-370) INJECTION 76%
INTRAVENOUS | Status: AC
  Filled 2016-01-21: qty 100

## 2016-01-21 MED ORDER — SODIUM CHLORIDE 0.9 % IV SOLN: 250.0000 mL | INTRAVENOUS | Status: DC | PRN

## 2016-01-21 MED ORDER — ACETAMINOPHEN 325 MG PO TABS: 650.0000 mg | ORAL_TABLET | ORAL | Status: DC | PRN

## 2016-01-21 MED ORDER — VERAPAMIL HCL 2.5 MG/ML IV SOLN
INTRAVENOUS | Status: AC
  Filled 2016-01-21: qty 2

## 2016-01-21 MED ORDER — VERAPAMIL HCL 2.5 MG/ML IV SOLN
INTRAVENOUS | Status: DC | PRN
  Administered 2016-01-21: 10 mL via INTRA_ARTERIAL

## 2016-01-21 MED ORDER — IOPAMIDOL (ISOVUE-370) INJECTION 76%
INTRAVENOUS | Status: DC | PRN
  Administered 2016-01-21: 55 mL

## 2016-01-21 MED ORDER — MIDAZOLAM HCL 2 MG/2ML IJ SOLN
INTRAMUSCULAR | Status: AC
Start: 2016-01-21 — End: 2016-01-21
  Filled 2016-01-21: qty 2

## 2016-01-21 MED ORDER — ONDANSETRON HCL 4 MG/2ML IJ SOLN: 4.0000 mg | Freq: Four times a day (QID) | INTRAMUSCULAR | Status: DC | PRN

## 2016-01-21 MED ORDER — FENTANYL CITRATE (PF) 100 MCG/2ML IJ SOLN
INTRAMUSCULAR | Status: AC
  Filled 2016-01-21: qty 2

## 2016-01-21 MED ORDER — LIDOCAINE HCL (PF) 1 % IJ SOLN
INTRAMUSCULAR | Status: DC | PRN
  Administered 2016-01-21 (×2): 2 mL

## 2016-01-21 MED ORDER — DIPHENHYDRAMINE HCL 50 MG/ML IJ SOLN
INTRAMUSCULAR | Status: AC
  Filled 2016-01-21: qty 1

## 2016-01-21 MED ORDER — HEPARIN SODIUM (PORCINE) 1000 UNIT/ML IJ SOLN
INTRAMUSCULAR | Status: AC
  Filled 2016-01-21: qty 1

## 2016-01-21 MED ORDER — LIDOCAINE HCL (PF) 1 % IJ SOLN
INTRAMUSCULAR | Status: AC
  Filled 2016-01-21: qty 30

## 2016-01-21 MED ORDER — SODIUM CHLORIDE 0.9 % IV SOLN
INTRAVENOUS | Status: DC
  Administered 2016-01-21: 500 mL via INTRAVENOUS

## 2016-01-21 MED ORDER — MIDAZOLAM HCL 10 MG/2ML IJ SOLN
INTRAMUSCULAR | Status: DC | PRN
  Administered 2016-01-21 (×2): 2 mg via INTRAVENOUS

## 2016-01-21 MED ORDER — SODIUM CHLORIDE 0.9 % WEIGHT BASED INFUSION: 1.0000 mL/kg/h | INTRAVENOUS | Status: DC

## 2016-01-21 MED ORDER — MIDAZOLAM HCL 5 MG/ML IJ SOLN
INTRAMUSCULAR | Status: AC
  Filled 2016-01-21: qty 2

## 2016-01-21 MED ORDER — FENTANYL CITRATE (PF) 100 MCG/2ML IJ SOLN
INTRAMUSCULAR | Status: DC | PRN
  Administered 2016-01-21: 25 ug via INTRAVENOUS

## 2016-01-21 MED ORDER — HEPARIN (PORCINE) IN NACL 2-0.9 UNIT/ML-% IJ SOLN
INTRAMUSCULAR | Status: DC | PRN
  Administered 2016-01-21: 13:00:00

## 2016-01-21 MED ORDER — HEPARIN SODIUM (PORCINE) 1000 UNIT/ML IJ SOLN
INTRAMUSCULAR | Status: DC | PRN
  Administered 2016-01-21: 5000 [IU] via INTRAVENOUS

## 2016-01-21 MED ORDER — MIDAZOLAM HCL 2 MG/2ML IJ SOLN
INTRAMUSCULAR | Status: DC | PRN
  Administered 2016-01-21: 1 mg via INTRAVENOUS

## 2016-01-21 MED ORDER — SODIUM CHLORIDE 0.9% FLUSH: 3.0000 mL | INTRAVENOUS | Status: DC | PRN

## 2016-01-21 MED ORDER — SODIUM CHLORIDE 0.9% FLUSH: 3.0000 mL | Freq: Two times a day (BID) | INTRAVENOUS | Status: DC

## 2016-01-21 MED ORDER — BUTAMBEN-TETRACAINE-BENZOCAINE 2-2-14 % EX AERO
INHALATION_SPRAY | CUTANEOUS | Status: DC | PRN
  Administered 2016-01-21: 2 via TOPICAL

## 2016-01-21 MED ORDER — FENTANYL CITRATE (PF) 100 MCG/2ML IJ SOLN
INTRAMUSCULAR | Status: DC | PRN
  Administered 2016-01-21: 25 ug via INTRAVENOUS
  Administered 2016-01-21: 50 ug via INTRAVENOUS

## 2016-01-21 SURGICAL SUPPLY — 12 items
CATH 5FR JR4 DIAGNOSTIC (CATHETERS) ×3 IMPLANT
CATH BALLN WEDGE 5F 110CM (CATHETERS) ×3 IMPLANT
CATH INFINITI 5 FR JL3.5 (CATHETERS) ×3 IMPLANT
DEVICE RAD COMP TR BAND LRG (VASCULAR PRODUCTS) ×3 IMPLANT
GLIDESHEATH SLEND SS 6F .021 (SHEATH) ×3 IMPLANT
GUIDEWIRE .025 260CM (WIRE) ×3 IMPLANT
KIT HEART LEFT (KITS) ×3 IMPLANT
PACK CARDIAC CATHETERIZATION (CUSTOM PROCEDURE TRAY) ×3 IMPLANT
SHEATH FAST CATH BRACH 5F 5CM (SHEATH) ×3 IMPLANT
TRANSDUCER W/STOPCOCK (MISCELLANEOUS) ×3 IMPLANT
TUBING CIL FLEX 10 FLL-RA (TUBING) ×3 IMPLANT
WIRE SAFE-T 1.5MM-J .035X260CM (WIRE) ×3 IMPLANT

## 2016-01-21 NOTE — Discharge Instructions (Signed)
Radial Site Care °Refer to this sheet in the next few weeks. These instructions provide you with information about caring for yourself after your procedure. Your health care provider may also give you more specific instructions. Your treatment has been planned according to current medical practices, but problems sometimes occur. Call your health care provider if you have any problems or questions after your procedure. °WHAT TO EXPECT AFTER THE PROCEDURE °After your procedure, it is typical to have the following: °· Bruising at the radial site that usually fades within 1-2 weeks. °· Blood collecting in the tissue (hematoma) that may be painful to the touch. It should usually decrease in size and tenderness within 1-2 weeks. °HOME CARE INSTRUCTIONS °· Take medicines only as directed by your health care provider. °· You may shower 24-48 hours after the procedure or as directed by your health care provider. Remove the bandage (dressing) and gently wash the site with plain soap and water. Pat the area dry with a clean towel. Do not rub the site, because this may cause bleeding. °· Do not take baths, swim, or use a hot tub until your health care provider approves. °· Check your insertion site every day for redness, swelling, or drainage. °· Do not apply powder or lotion to the site. °· Do not flex or bend the affected arm for 24 hours or as directed by your health care provider. °· Do not push or pull heavy objects with the affected arm for 24 hours or as directed by your health care provider. °· Do not lift over 10 lb (4.5 kg) for 5 days after your procedure or as directed by your health care provider. °· Ask your health care provider when it is okay to: °¨ Return to work or school. °¨ Resume usual physical activities or sports. °¨ Resume sexual activity. °· Do not drive home if you are discharged the same day as the procedure. Have someone else drive you. °· You may drive 24 hours after the procedure unless otherwise  instructed by your health care provider. °· Do not operate machinery or power tools for 24 hours after the procedure. °· If your procedure was done as an outpatient procedure, which means that you went home the same day as your procedure, a responsible adult should be with you for the first 24 hours after you arrive home. °· Keep all follow-up visits as directed by your health care provider. This is important. °SEEK MEDICAL CARE IF: °· You have a fever. °· You have chills. °· You have increased bleeding from the radial site. Hold pressure on the site. CALL 911 °SEEK IMMEDIATE MEDICAL CARE IF: °· You have unusual pain at the radial site. °· You have redness, warmth, or swelling at the radial site. °· You have drainage (other than a small amount of blood on the dressing) from the radial site. °· The radial site is bleeding, and the bleeding does not stop after 30 minutes of holding steady pressure on the site. °· Your arm or hand becomes pale, cool, tingly, or numb. °  °This information is not intended to replace advice given to you by your health care provider. Make sure you discuss any questions you have with your health care provider. °  °Document Released: 05/14/2010 Document Revised: 05/02/2014 Document Reviewed: 10/28/2013 °Elsevier Interactive Patient Education ©2016 [employer] ° °

## 2016-01-21 NOTE — CV Procedure (Signed)
Procedure: TEE  Indication: Aortic stenosis  Sedation: Versed 4 mg IV, Fentanyl 75 mcg IV  Findings: Please see echo section for full report.  Normal LV size with mild LV hypertrophy.  EF 60-65%.  Normal wall motion.  Normal RV size and systolic function.  No significant tricuspid regurgitation.  Trivial mitral regurgitation.  Normal right atrial size, mild left atrial enlargement.  No LAA thrombus.  No ASD or PFO.  The aortic valve was bicuspid with moderate aortic stenosis.  AVA by planimetry was 1.05 cm^2, mean gradient 24 mmHg.  Mild aortic insufficiency.  The ascending aorta was mildly dilated to 4.2 cm.   Aortic stenosis remains moderate.  Luis [name] 01/21/2016 10:08 AM

## 2016-01-21 NOTE — Interval H&P Note (Signed)
Cath Lab Visit (complete for each Cath Lab visit)  Clinical Evaluation Leading to the Procedure:   ACS: No.  Non-ACS:    Anginal Classification: CCS III  Anti-ischemic medical therapy: Minimal Therapy (1 class of medications)  Non-Invasive Test Results: Low-risk stress test findings: cardiac mortality <1%/year  Prior CABG: No previous CABG      History and Physical Interval Note:  01/21/2016 1:05 PM  Luis [name]  has presented today for surgery, with the diagnosis of arotic stenosis - shortness of breath  The various methods of treatment have been discussed with the patient and family. After consideration of risks, benefits and other options for treatment, the patient has consented to  Procedure(s): Right/Left Heart Cath and Coronary Angiography (N/A) as a surgical intervention .  The patient's history has been reviewed, patient examined, no change in status, stable for surgery.  I have reviewed the patient's chart and labs.  Questions were answered to the patient's satisfaction.     Landynn Dupler [employer]

## 2016-01-21 NOTE — H&P (View-Only) (Signed)
Patient ID: Luis [name], male   DOB: Mar 23, 1956, 60 y.o.   MRN: [id number] PCP: Dr. Sarajane Jews  60 y.o. with history of CAD and moderate aortic stenosis presents for cardiology followup. He had RCA PCI in 8/12.     When I saw him in early 2016, he reported significant exertional dyspnea.  I had him do a Lexiscan Cardiolite in 2/16.  This showed no ischemia or infarction.  I also had him get an echo that showed ?bicuspid aortic valve and probably moderate aortic stenosis.  Given ongoing symptoms, I had him do a RHC/LHC and TEE in 2/16.  The RHC showed mildly elevated left and right heart filling pressures and the LHC showed nonobstructive CAD.  TEE confirmed bicuspid aortic valve with moderate AS.  He had PFTs in 4/16 that were within normal limits.  I started him on Lasix 20 mg daily.  He seemed to improve.    However, over the last few months he has again developed significant exertional dyspnea.  His wife and sister are very worried about him.  He has been short of breath with any moderate activity and has to take frequent rest breaks.  He is short of breath just going out to feed the dogs.  Still working full time.  No energy.  Sleepy during the day (not using CPAP).  No syncope/lightheadedness.  No orthopnea/PND.  No chest pain. He was seen by the PA recently with these complaints.  Echo 8/17 showed preserved EF with probably moderate aortic stenosis.  Cardiolite did not show evidence for ischemia or infarction.  Symptoms are ongoing despite the normal studies and are slowly progressive.   Labs (2/15): K 4.4, creatinine 0.9, LDL 71, HDL 53 Labs (2/16): K 4.8, creatinine 1.0, LDL 62, HDL 57 Labs (8/16): K 4.7, creatinine 0.95, TSH normal, HCT 41.2, LDL 78, HDL 58 Labs (8/17): K 4.3, creatinine 1.05, HCT 40.1, TSH normal, BNP 56  PMH: 1. OSA: Not using CPAP.  2. CAD: Cardiolite 8/12 with inferior infarct and peri-infarct ischemia, had PCI to the mid and distal RCA with Promus DES x 2.  Lexiscan  Cardiolite (2/16) with no ischemia or infarction. LHC/RHC (2/16) with mean RA 12, PA 32/15, mean PCWP 18, CI 3.47; patent mid and distal RCA stents, 50-60% proximal stenosis small PDA.   - Cardiolite (8/17): EF 56%, normal.  3. Carotid stenosis: 2/15 carotid dopplers 1-39% bilateral ICA stenosis.  4. Colon polyps 5. HTN 6. Hyperlipidemia 7. Abdominal US (10/12) with no AAA 8. Aortic stenosis: Echo (2/15) with EF 60-65%, mild LVH, moderate diastolic dysfunction, normal RV size/systolic function, moderate aortic stenosis with mean gradient 31 and AVA 1.16 cm2, ascending aorta 4.4 cm.  Echo (2/16) with EF 60-65%, grade II diastolic dysfunction, ?bicuspid aortic valve with moderate AS (mean gradient 35 mmHg, AVA 1.5 cm^2), mildly dilated RV with normal systolic function.  TEE (2/16) with bicuspid aortic valve, moderate AS mean gradient 30 mmHg, AVA > 1 cm^2, EF 55-60%, mild LVH, mildly dilated RV with normal systolic function, mild AI, mild MR, ascending aorta 4.2 cm.  - Echo (8/17): EF 60-65%, grade II diastolic dysfunction, bicuspid aortic valve with mean gradient 31 mmHg, probably moderate stenosis.  9. Ascending aorta aneurysm: 4.4 cm on 2/15 echo. Ascending aorta 4.2 cm TEE 2/16.  10. Chronic diastolic CHF.  11. PFTs (4/16) were within normal limits.  12. Lower extremity arterial dopplers normal in 8/17.   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  Sister with RyR2 gene.   ROS: All systems reviewed and negative except as per HPI.   Current Outpatient Prescriptions  Medication Sig Dispense Refill  . aspirin 81 MG tablet Take 81 mg by mouth every morning.     . diazepam (VALIUM) 5 MG tablet Take 1 tablet (5 mg total) by mouth every 8 (eight) hours as needed for anxiety. 60 tablet 2  . escitalopram (LEXAPRO) 20 MG tablet Take 1 tablet (20 mg total) by mouth daily. 90 tablet 3  . furosemide (LASIX) 40 MG tablet TAKE 1 TABLET (40 MG TOTAL) BY MOUTH DAILY. 90  tablet 2  . ibuprofen (ADVIL,MOTRIN) 200 MG tablet Take 200 mg by mouth 2 (two) times daily as needed for moderate pain.     [name] ipratropium (ATROVENT) 0.03 % nasal spray Place 2 sprays into both nostrils every 12 (twelve) hours as needed for rhinitis.    [name] KLOR-CON 10 10 MEQ tablet TAKE 1 TABLET BY MOUTH DAILY 30 tablet 11  . meclizine (ANTIVERT) 25 MG tablet Take 25 mg by mouth 3 (three) times daily as needed for dizziness.    . nitroGLYCERIN (NITROSTAT) 0.4 MG SL tablet Place 1 tablet (0.4 mg total) under the tongue every 5 (five) minutes as needed. For chest pain. 25 tablet 0  . rosuvastatin (CRESTOR) 20 MG tablet TAKE 1 TABLET BY MOUTH AT BEDTIME 30 tablet 11   No current facility-administered medications for this visit.    BP 130/80   Pulse 62   Ht 5' 6.5" (1.689 m)   Wt 238 lb (108 kg)   BMI 37.84 kg/m  General: NAD Neck: Thick, no JVD, no thyromegaly or thyroid nodule.  Lungs: Clear to auscultation bilaterally with normal respiratory effort. CV: Nondisplaced PMI.  Heart regular S1/S2, no [id number], 3/6 systolic crescendo-decrescendo murmur RUSB with somewhat muffled S2.  1+ ankle edema.  No carotid bruit.  Normal pedal pulses.  Abdomen: Soft, nontender, no hepatosplenomegaly, no distention.  Skin: Intact without lesions or rashes.  Neurologic: Alert and oriented x 3.  Psych: Normal affect. Extremities: No clubbing or cyanosis.  HEENT: Normal.   Assessment/Plan: 1. CAD: Prior PCI to RCA in 8/12.  LHC (2/16) with nonobstructive disease.  I had started him on Imdur for ?microvascular angina but he developed severe headaches without any improvement in symptoms.  Cardiolite in 8/17 with no evidence for ischemia/infarction.  He continues to have significant exertional symptoms, they are progressive. He and his family are very worried about his symptoms. PFTs were normal within the last year.   - We discussed options to further investigate his dyspnea.  We decided that the best option at this  point with ongoing exertional symptoms that seem to be worsening would be right and left heart catheterization.  We discussed risks/benefits and he agrees to proceed.  - Continue ASA 81 and Crestor.  2. Aortic stenosis: Bicuspid valve with moderate AS by echo 8/17. Given ongoing severe dyspnea, I will repeat TEE on the day of his cath to more closely investigate the significance of his aortic stenosis.  We discussed risks/benefits of TEE today and he agreed to proceed.   3. Hyperlipidemia: Good LDL on current statin dose.  4. Ascending aortic aneurysm: Associated with bicuspid aortic valve.  4.2 cm ascending aorta on 2/16 TEE.  Reassess with TEE. 5. Chronic diastolic CHF:  Lasix has not really helped his symptoms.  He does not appear markedly volume overloaded. Will continue current dose.  6.  OSA: Untreated currently.  I think that a lot of his daytime sleepiness/fatigue may be due to OSA.  I am going to refer him back to sleep medicine for re-evaluation.     [name] 01/15/2016

## 2016-01-21 NOTE — Interval H&P Note (Signed)
History and Physical Interval Note:  01/21/2016 9:39 AM  Luis [name]  has presented today for surgery, with the diagnosis of AORTIC STENOSIS AND SHORTNESS OF BREATH  The various methods of treatment have been discussed with the patient and family. After consideration of risks, benefits and other options for treatment, the patient has consented to  Procedure(s): TRANSESOPHAGEAL ECHOCARDIOGRAM (TEE) (N/A) as a surgical intervention .  The patient's history has been reviewed, patient examined, no change in status, stable for surgery.  I have reviewed the patient's chart and labs.  Questions were answered to the patient's satisfaction.     Willliam Pettet [employer]

## 2016-01-21 NOTE — OR Nursing (Signed)
New scope had to be inserted after the first one didn't work properly,

## 2016-01-22 ENCOUNTER — Encounter (HOSPITAL_COMMUNITY): Payer: Self-pay | Admitting: Cardiology

## 2016-02-05 ENCOUNTER — Ambulatory Visit: Payer: BC Managed Care – PPO | Admitting: Physician Assistant

## 2016-02-16 ENCOUNTER — Other Ambulatory Visit: Payer: BC Managed Care – PPO

## 2016-02-24 ENCOUNTER — Institutional Professional Consult (permissible substitution): Payer: BC Managed Care – PPO | Admitting: Cardiology

## 2016-02-29 ENCOUNTER — Encounter: Payer: Self-pay | Admitting: Cardiology

## 2016-02-29 ENCOUNTER — Ambulatory Visit (INDEPENDENT_AMBULATORY_CARE_PROVIDER_SITE_OTHER): Payer: BC Managed Care – PPO | Admitting: Cardiology

## 2016-02-29 VITALS — BP 134/78 | HR 69 | Ht 66.5 in | Wt 237.8 lb

## 2016-02-29 DIAGNOSIS — I10 Essential (primary) hypertension: Secondary | ICD-10-CM | POA: Diagnosis not present

## 2016-02-29 DIAGNOSIS — G4733 Obstructive sleep apnea (adult) (pediatric): Secondary | ICD-10-CM | POA: Diagnosis not present

## 2016-02-29 DIAGNOSIS — E669 Obesity, unspecified: Secondary | ICD-10-CM | POA: Diagnosis not present

## 2016-02-29 HISTORY — DX: Obesity, unspecified: E66.9

## 2016-02-29 NOTE — Patient Instructions (Signed)
Medication Instructions:  Your physician recommends that you continue on your current medications as directed. Please refer to the Current Medication list given to you today.   Labwork: None  Testing/Procedures: Dr. Radford Pax recommends you have a SLEEP STUDY.  Follow-Up: Your physician recommends that you schedule a follow-up appointment AS NEEDED with Dr. Radford Pax pending study results.   Any Other Special Instructions Will Be Listed Below (If Applicable).     If you need a refill on your cardiac medications before your next appointment, please call your pharmacy.

## 2016-02-29 NOTE — Progress Notes (Signed)
Cardiology Office Note    Date:  02/29/2016   ID:  Luis [name], DOB May 25, 1955, MRN [id number]  PCP:  Laurey Morale, MD  Cardiologist:  Fransico Him, MD   Chief Complaint  Patient presents with  . Sleep Apnea  . Hypertension    History of Present Illness:  Luis [name] is a 60 y.o. male with a history of moderate AS, ascending aortic aneurysm, ASCAD and CHF who presents for evaluation of OSA.  He was diagnosed with OSA a few years ago and was on CPAP initially but stopped about a year ago because he cannot breath through his nose.  He started out with the nasal pillow mask but cannot breath through his nose when he lays down so he did not tolerate the mask.  He has not tried the full face mask due to having a beard.  His wife says that he snores loudly and has witnessed him having apneic episodes.  He sleeps terrible at night because he cannot breathe through his nose.  He has excessive daytime sleepiness and tires out quickly.  He has no am headaches.  He is followed by ENT and it has been recommeded that he had nasal surgery but it has been denied by insurance. He has tried nasal saline spray, steroid spray and antibx without any improvement.      Past Medical History:  Diagnosis Date  . Anxiety    takes Valium as needed  . Aortic stenosis, moderate   . Arthritis   . Ascending aortic aneurysm ([place])   . CAD (coronary artery disease)    a. s/p PCI of the RCA 8/12 with DES by Dr Burt Knack, preserved EF. b. LHC/RHC (2/16) with mean RA 12, PA 32/15, mean PCWP 18, CI 3.47; patent mid and distal RCA stents, 50-60% proximal stenosis small PDA.   . Carotid stenosis    a. Carotid US (05/2013):  Bilateral 1-39% ICA; L thyroid nodule (prior hx of aspiration).  . Chronic diastolic CHF (congestive heart failure) ([place])   . Depression   . Essential hypertension   . GERD (gastroesophageal reflux disease)    if needed will take OTC meds   . Heart murmur   . History of colonic polyps    hyperplastic  . Hyperlipidemia   . Joint pain   . Obesity (BMI 30-39.9) 02/29/2016  . Restless leg   . Sleep apnea    STOPBANG=5  . Vertigo    takes Meclizine as needed    Past Surgical History:  Procedure Laterality Date  . CATARACT EXTRACTION   4 YRS AGO   BOTH EYES  . CHOLECYSTECTOMY  07/21/2011   Procedure: LAPAROSCOPIC CHOLECYSTECTOMY WITH INTRAOPERATIVE CHOLANGIOGRAM;  Surgeon: Shann Medal, MD;  Location: [facility];  Service: General;  Laterality: N/A;  . CORONARY ANGIOPLASTY     2 stents  . coronary stenting     s/p PCI of the RCA by Dr Burt Knack 8/12 with 2 promus stents  . LEFT AND RIGHT HEART CATHETERIZATION WITH CORONARY ANGIOGRAM N/A 06/23/2014   Procedure: LEFT AND RIGHT HEART CATHETERIZATION WITH CORONARY ANGIOGRAM;  Surgeon: Larey Dresser, MD;  Location: [facility] CATH LAB;  Service: Cardiovascular;  Laterality: N/A;  . NECK SURGERY  03/23/09   per Dr. Lorin Mercy, cervical fusion   . right elbow surgery    . solonscopy  05/23/08   per Dr. Wynona Luna hemorrhoids only, repeat in 5 years  . TEE WITHOUT CARDIOVERSION N/A 06/23/2014   Procedure: TRANSESOPHAGEAL ECHOCARDIOGRAM (TEE);  Surgeon: Larey Dresser, MD;  Location: [facility];  Service: Cardiovascular;  Laterality: N/A;  . TEE WITHOUT CARDIOVERSION N/A 01/21/2016   Procedure: TRANSESOPHAGEAL ECHOCARDIOGRAM (TEE);  Surgeon: Larey Dresser, MD;  Location: [facility] ENDOSCOPY;  Service: Cardiovascular;  Laterality: N/A;  . TONSILLECTOMY      Current Medications: Outpatient Medications Prior to Visit  Medication Sig Dispense Refill  . aspirin 81 MG tablet Take 81 mg by mouth every morning.     . diazepam (VALIUM) 5 MG tablet Take 1 tablet (5 mg total) by mouth every 8 (eight) hours as needed for anxiety. 60 tablet 2  . escitalopram (LEXAPRO) 20 MG tablet Take 1 tablet (20 mg total) by mouth daily. 90 tablet 3  . furosemide (LASIX) 40 MG tablet TAKE 1 TABLET (40 MG TOTAL) BY MOUTH DAILY. 90 tablet 2  . ibuprofen  (ADVIL,MOTRIN) 200 MG tablet Take 200 mg by mouth 2 (two) times daily as needed for moderate pain.     [name] ipratropium (ATROVENT) 0.03 % nasal spray Place 2 sprays into both nostrils every 12 (twelve) hours as needed for rhinitis.    [name] KLOR-CON 10 10 MEQ tablet TAKE 1 TABLET BY MOUTH DAILY 30 tablet 11  . meclizine (ANTIVERT) 25 MG tablet Take 25 mg by mouth 3 (three) times daily as needed for dizziness.    . nitroGLYCERIN (NITROSTAT) 0.4 MG SL tablet Place 1 tablet (0.4 mg total) under the tongue every 5 (five) minutes as needed. For chest pain. 25 tablet 0  . rosuvastatin (CRESTOR) 20 MG tablet TAKE 1 TABLET BY MOUTH AT BEDTIME 30 tablet 11   No facility-administered medications prior to visit.      Allergies:   Patient has no known allergies.   Social History   Social History  . Marital status: Married    Spouse name: N/A  . Number of children: N/A  . Years of education: N/A   Occupational History  . [employer] operational officer   Social History Main Topics  . Smoking status: Former Research scientist (life sciences)  . Smokeless tobacco: Never Used     Comment: 5 yrs ago  . Alcohol use No  . Drug use: No  . Sexual activity: Not Asked   Other Topics Concern  . None   Social History Narrative  . None     Family History:  The patient's family history includes Cancer in his mother and other; Heart disease in his other.   ROS:   Please see the history of present illness.    ROS All other systems reviewed and are negative.  No flowsheet data found.     PHYSICAL EXAM:   VS:  BP 134/78   Pulse 69   Ht 5' 6.5" (1.689 m)   Wt 237 lb 12.8 oz (107.9 kg)   SpO2 95%   BMI 37.81 kg/m    GEN: Well nourished, well developed, in no acute distress  HEENT: normal  Neck: no JVD, carotid bruits, or masses Cardiac: RRR; no rubs, or gallops,no edema.  Intact distal pulses bilaterally. 2/6 late peaking SM at RUSB to LLSB Respiratory:  clear to auscultation bilaterally, normal work of  breathing GI: soft, nontender, nondistended, + BS MS: no deformity or atrophy  Skin: warm and dry, no rash Neuro:  Alert and Oriented x 3, Strength and sensation are intact Psych: euthymic mood, full affect  Wt Readings from Last 3 Encounters:  02/29/16 237 lb 12.8 oz (107.9 kg)  01/21/16 238 lb (108  kg)  01/14/16 238 lb (108 kg)      Studies/Labs Reviewed:   EKG:  EKG is not ordered today.  Recent Labs: 11/27/2015: Brain Natriuretic Peptide 55.6; Magnesium 2.1; TSH 0.58 01/14/2016: BUN 17; Creat 1.08; Hemoglobin 13.5; Platelets 184; Potassium 4.0; Sodium 140   Lipid Panel    Component Value Date/Time   CHOL 161 11/26/2014 0911   TRIG 125.0 11/26/2014 0911   HDL 58.40 11/26/2014 0911   CHOLHDL 3 11/26/2014 0911   VLDL 25.0 11/26/2014 0911   LDLCALC 78 11/26/2014 0911   LDLDIRECT 145.6 03/16/2007 0908    Additional studies/ records that were reviewed today include:  Office notes from Dr. Aundra Dubin    ASSESSMENT:    1. OSA (obstructive sleep apnea)   2. Essential hypertension   3. Obesity (BMI 30-39.9)      PLAN:  In order of problems listed above:  1. OSA - that patient has tried CPAP but could not tolerate the nasal pillow mask due to significant nasal obstruction.  He has been evaluated by ENT and recommendations have been made for nasal septoplasty but apparently insurance will not cover the procedure.  He has had significant OSA in the past with excessive daytime sleepiness, snoring and witnessed apnea at night by his wife.  He also has CHF, CAD, HTN and significant AS and all could be impacted by his OSA.  At this time I have recommended that he proceed with sleep study to determine severity of his OSA.  Given his significant nasal obstruction he will likely not tolerate CPAP treatment until his nasal septoplasty has been performed as he cannot lay down at night to breath.  Recommend proceeding with ENT surgery and then treat underlying OSA.  2. HTN - BP  controlled. 3. Obesity - I have encouraged him to get into a routine exercise program and cut back on carbs and portions.     Medication Adjustments/Labs and Tests Ordered: Current medicines are reviewed at length with the patient today.  Concerns regarding medicines are outlined above.  Medication changes, Labs and Tests ordered today are listed in the Patient Instructions below.  There are no Patient Instructions on file for this visit.   Signed, Fransico Him, MD  02/29/2016 3:08 PM    [facility] [place], [place], [place]  [address] Phone: [phone]; Fax: [phone]

## 2016-04-21 ENCOUNTER — Ambulatory Visit (HOSPITAL_BASED_OUTPATIENT_CLINIC_OR_DEPARTMENT_OTHER): Payer: BC Managed Care – PPO | Attending: Cardiology | Admitting: Cardiology

## 2016-04-21 DIAGNOSIS — G4733 Obstructive sleep apnea (adult) (pediatric): Secondary | ICD-10-CM | POA: Diagnosis present

## 2016-04-21 DIAGNOSIS — G4736 Sleep related hypoventilation in conditions classified elsewhere: Secondary | ICD-10-CM | POA: Diagnosis not present

## 2016-04-26 ENCOUNTER — Telehealth: Payer: Self-pay | Admitting: *Deleted

## 2016-04-26 NOTE — Procedures (Signed)
   Patient Name: Luis [name], Luis [name] Date: 04/21/2016 Gender: Male D.O.B: [birth year] Age (years): 61 Referring Provider: Fransico Him MD, ABSM Height (inches): 51 Interpreting Physician: Fransico Him MD, ABSM Weight (lbs): 237 RPSGT: Baxter Flattery BMI: 38 MRN: [id number] Neck Size: 19.00  CLINICAL INFORMATION Sleep Study Type: NPSG  Indication for sleep study: Fatigue, Hypertension, Obesity, Snoring, Witnesses Apnea / Gasping During Sleep  Epworth Sleepiness Score: 12  SLEEP STUDY TECHNIQUE As per the AASM Manual for the Scoring of Sleep and Associated Events v2.3 (April 2016) with a hypopnea requiring 4% desaturations.  The channels recorded and monitored were frontal, central and occipital EEG, electrooculogram (EOG), submentalis EMG (chin), nasal and oral airflow, thoracic and abdominal wall motion, anterior tibialis EMG, snore microphone, electrocardiogram, and pulse oximetry.  MEDICATIONS Medications self-administered by patient taken the night of the study : N/A  SLEEP ARCHITECTURE The study was initiated at 10:43:23 PM and ended at 5:13:08 AM.  Sleep onset time was 15.1 minutes and the sleep efficiency was 70.9%. The total sleep time was 276.5 minutes.  Stage REM latency was 272.5 minutes.  The patient spent 17.72% of the night in stage N1 sleep, 58.05% in stage N2 sleep, 0.00% in stage N3 and 24.23% in REM.  Alpha intrusion was absent.  Supine sleep was 3.36%.  RESPIRATORY PARAMETERS The overall apnea/hypopnea index (AHI) was 24.5 per hour. There were 43 total apneas, including 42 obstructive, 1 central and 0 mixed apneas. There were 70 hypopneas and 21 RERAs.  The AHI during Stage REM sleep was 7.2 per hour.  AHI while supine was 116.3 per hour.  The mean oxygen saturation was 92.95%. The minimum SpO2 during sleep was 84.00%.  Loud snoring was noted during this study.  CARDIAC DATA The 2 lead EKG demonstrated sinus rhythm. The mean heart rate was  49.95 beats per minute. Other EKG findings include: None.  LEG MOVEMENT DATA The total PLMS were 312 with a resulting PLMS index of 67.70. Associated arousal with leg movement index was 7.2 .  IMPRESSIONS - Moderate obstructive sleep apnea occurred during this study (AHI = 24.5/h). - No significant central sleep apnea occurred during this study (CAI = 0.2/h). - Oxygen desaturation was noted during this study (Min O2 = 84.00%). - The patient snored with Loud snoring volume. - No cardiac abnormalities were noted during this study. - Severe periodic limb movements of sleep occurred during the study. Associated arousals were significant.  DIAGNOSIS - Obstructive Sleep Apnea (327.23 [G47.33 ICD-10]) - Nocturnal Hypoxemia (327.26 [G47.36 ICD-10])  RECOMMENDATIONS - Therapeutic CPAP titration to determine optimal pressure required to alleviate sleep disordered breathing. - Positional therapy avoiding supine position during sleep. - Avoid alcohol, sedatives and other CNS depressants that may worsen sleep apnea and disrupt normal sleep architecture. - Sleep hygiene should be reviewed to assess factors that may improve sleep quality. - Weight management and regular exercise should be initiated or continued if appropriate.  [place], American Board of Sleep Medicine  ELECTRONICALLY SIGNED ON:  04/26/2016, 4:43 PM [place] PH: [phone]   FX: [phone] [place]

## 2016-04-26 NOTE — Telephone Encounter (Signed)
-----   Message from Sueanne Margarita, MD sent at 04/26/2016  4:45 PM EST ----- Please let patient know that they have sleep apnea and recommend CPAP titration. Please set up titration in the sleep lab.

## 2016-04-26 NOTE — Telephone Encounter (Signed)
Called the patient and gave him his sleep results, he verbalized understanding

## 2016-04-27 ENCOUNTER — Other Ambulatory Visit: Payer: Self-pay | Admitting: *Deleted

## 2016-04-27 DIAGNOSIS — G4733 Obstructive sleep apnea (adult) (pediatric): Secondary | ICD-10-CM

## 2016-04-28 ENCOUNTER — Encounter: Payer: Self-pay | Admitting: *Deleted

## 2016-05-10 ENCOUNTER — Other Ambulatory Visit: Payer: Self-pay | Admitting: Allergy and Immunology

## 2016-06-02 ENCOUNTER — Encounter: Payer: Self-pay | Admitting: Family Medicine

## 2016-06-02 ENCOUNTER — Ambulatory Visit (INDEPENDENT_AMBULATORY_CARE_PROVIDER_SITE_OTHER): Payer: BC Managed Care – PPO | Admitting: Family Medicine

## 2016-06-02 VITALS — BP 148/88 | HR 60 | Temp 97.7°F | Ht 66.0 in | Wt 241.0 lb

## 2016-06-02 DIAGNOSIS — L02229 Furuncle of trunk, unspecified: Secondary | ICD-10-CM

## 2016-06-02 MED ORDER — DOXYCYCLINE HYCLATE 100 MG PO CAPS: 100.0000 mg | ORAL_CAPSULE | Freq: Two times a day (BID) | ORAL | 0 refills | Status: AC

## 2016-06-02 NOTE — Progress Notes (Signed)
Pre visit review using our clinic review tool, if applicable. No additional management support is needed unless otherwise documented below in the visit note. 

## 2016-06-02 NOTE — Progress Notes (Signed)
   Subjective:    Patient ID: Luis [name], male    DOB: 08/21/1955, 61 y.o.   MRN: [id number]  HPI Here for several days of a tender boil on the back. No fever. It opened and drained last night.   Review of Systems  Constitutional: Negative.   Skin: Positive for wound.       Objective:   Physical Exam  Constitutional: He appears well-developed and well-nourished. No distress.  Skin:  There is a large tender boil in the center of the back.          Assessment & Plan:  The boil was lanced with a scalpel and it was emptied of all its purulent material. Treat with Doxycycline. Culture the sample.  [name], MD

## 2016-06-05 LAB — WOUND CULTURE
Gram Stain: NONE SEEN
ORGANISM ID, BACTERIA: NORMAL

## 2016-06-12 ENCOUNTER — Ambulatory Visit (HOSPITAL_BASED_OUTPATIENT_CLINIC_OR_DEPARTMENT_OTHER): Payer: BC Managed Care – PPO | Attending: Cardiology | Admitting: Cardiology

## 2016-06-12 VITALS — Ht 66.0 in | Wt 241.0 lb

## 2016-06-12 DIAGNOSIS — G4733 Obstructive sleep apnea (adult) (pediatric): Secondary | ICD-10-CM | POA: Diagnosis not present

## 2016-06-30 ENCOUNTER — Telehealth: Payer: Self-pay | Admitting: *Deleted

## 2016-06-30 NOTE — Telephone Encounter (Signed)
-----   Message from Luis Margarita, MD sent at 06/30/2016  2:30 PM EST ----- Pt had successful PAP titration. Please setup appointment in 10 weeks. Please let [facility] know that order for PAP is in EPIC.

## 2016-06-30 NOTE — Procedures (Signed)
   Patient Name: Luis [name], Luis [name] Date: 06/12/2016 Gender: Male D.O.B: 09-Jun-1955 Age (years): 72 Referring Provider: Fransico Him MD, ABSM Height (inches): 66 Interpreting Physician: Fransico Him MD, ABSM Weight (lbs): 241 RPSGT: Zadie Rhine BMI: 39 MRN: [id number] Neck Size: 19.00  CLINICAL INFORMATION The patient is referred for a CPAP titration to treat sleep apnea. Date of NPSG, Split Night or HST: 04/21/2016  SLEEP STUDY TECHNIQUE As per the AASM Manual for the Scoring of Sleep and Associated Events v2.3 (April 2016) with a hypopnea requiring 4% desaturations.  The channels recorded and monitored were frontal, central and occipital EEG, electrooculogram (EOG), submentalis EMG (chin), nasal and oral airflow, thoracic and abdominal wall motion, anterior tibialis EMG, snore microphone, electrocardiogram, and pulse oximetry. Continuous positive airway pressure (CPAP) was initiated at the beginning of the study and titrated to treat sleep-disordered breathing.  MEDICATIONS Medications self-administered by patient taken the night of the study : N/A  TECHNICIAN COMMENTS Comments added by technician: NO RESTROOM VISTED. Patient had difficulty initiating sleep.  Comments added by scorer: N/A  RESPIRATORY PARAMETERS Optimal PAP Pressure (cm): 20  AHI at Optimal Pressure (/hr):0.0 Overall Minimal O2 (%):85.00  Supine % at Optimal Pressure (%): 0 Minimal O2 at Optimal Pressure (%): 92.0    SLEEP ARCHITECTURE The study was initiated at 9:08:44 PM and ended at 4:18:14 AM.  Sleep onset time was 29.6 minutes and the sleep efficiency was 67.9%. The total sleep time was 291.5 minutes.  The patient spent 5.49% of the night in stage N1 sleep, 41.85% in stage N2 sleep, 0.00% in stage N3 and 52.66% in REM.Stage REM latency was 153.5 minutes  Wake after sleep onset was 108.4. Alpha intrusion was absent. Supine sleep was 28.30%.  CARDIAC DATA The 2 lead EKG demonstrated sinus  rhythm. The mean heart rate was 51.97 beats per minute. Other EKG findings include: None.  LEG MOVEMENT DATA The total Periodic Limb Movements of Sleep (PLMS) were 517. The PLMS index was 106.42. A PLMS index of <15 is considered normal in adults.  IMPRESSIONS - The optimal PAP pressure was 20 cm of water. - Central sleep apnea was not noted during this titration (CAI = 0.8/h). - Moderate oxygen desaturations were observed during this titration (min O2 = 85.00%). - No snoring was audible during this study. - No cardiac abnormalities were observed during this study. - Severe periodic limb movements were observed during this study. Arousals associated with PLMs were rare.  DIAGNOSIS - Obstructive Sleep Apnea (327.23 [G47.33 ICD-10])  RECOMMENDATIONS - Trial of CPAP therapy on 20 cm H2O with a Medium size Resmed Full Face Mask AirFit F20 mask and heated humidification. - Avoid alcohol, sedatives and other CNS depressants that may worsen sleep apnea and disrupt normal sleep architecture. - Sleep hygiene should be reviewed to assess factors that may improve sleep quality. - Weight management and regular exercise should be initiated or continued. - Return to Sleep Center for re-evaluation after 10 weeks of therapy  [place], [place] [facility]  ELECTRONICALLY SIGNED ON:  06/30/2016, 2:27 PM [place] PH: [phone]   FX: [phone] [place]

## 2016-06-30 NOTE — Telephone Encounter (Signed)
Called the patient and gave him his sleep study results, he verbalized understanding and agreed but states that he already has a cpap machine that works well and is less than 63 years old

## 2016-07-15 ENCOUNTER — Other Ambulatory Visit: Payer: Self-pay | Admitting: Cardiology

## 2016-08-04 ENCOUNTER — Other Ambulatory Visit: Payer: Self-pay | Admitting: Family Medicine

## 2016-08-26 ENCOUNTER — Telehealth: Payer: Self-pay | Admitting: *Deleted

## 2016-08-29 NOTE — Telephone Encounter (Addendum)
Patient states he stopped using his cpap for about a year and has been using it again since he had his titration done June 12 2016. Patient notified of 10 week sleep appointment made for Sep 21 2016. Patient understands he must keep this appointment for insurance purposes. Patient agrees and thanked me for calling.

## 2016-08-31 ENCOUNTER — Encounter: Payer: Self-pay | Admitting: Cardiology

## 2016-09-07 NOTE — Telephone Encounter (Signed)
-----   Message from Sueanne Margarita, MD sent at 09/06/2016  5:16 PM EDT ----- Good AHI and compliance.  Continue current CPAP settings.

## 2016-09-07 NOTE — Telephone Encounter (Signed)
Message  Received: Yesterday  Message Contents  Sueanne Margarita, MD  Freada Bergeron, CMA        Good AHI and compliance. Continue current CPAP settings.     CPAP  Order: [id number]  Status:  Final result Visible to patient:  Yes (MyChart)  Notes recorded by Freada Bergeron, CMA on 09/07/2016 at 10:47 AM EDT Informed patient of results and patient understanding was verbalized. Patient was grateful for the call. ------  Notes recorded by Sueanne Margarita, MD on 09/06/2016 at 5:16 PM EDT Good AHI and compliance. Continue current CPAP settings.

## 2016-09-14 ENCOUNTER — Other Ambulatory Visit: Payer: Self-pay | Admitting: Cardiology

## 2016-09-14 NOTE — Telephone Encounter (Signed)
Pt has an appt with Dr. Aundra Dubin at [facility]

## 2016-09-21 ENCOUNTER — Ambulatory Visit (INDEPENDENT_AMBULATORY_CARE_PROVIDER_SITE_OTHER): Payer: BC Managed Care – PPO | Admitting: Cardiology

## 2016-09-21 ENCOUNTER — Other Ambulatory Visit: Payer: Self-pay | Admitting: *Deleted

## 2016-09-21 ENCOUNTER — Encounter: Payer: Self-pay | Admitting: Cardiology

## 2016-09-21 VITALS — BP 118/78 | HR 54 | Ht 66.0 in | Wt 245.4 lb

## 2016-09-21 DIAGNOSIS — G4733 Obstructive sleep apnea (adult) (pediatric): Secondary | ICD-10-CM | POA: Diagnosis not present

## 2016-09-21 DIAGNOSIS — I10 Essential (primary) hypertension: Secondary | ICD-10-CM | POA: Diagnosis not present

## 2016-09-21 MED ORDER — ROSUVASTATIN CALCIUM 20 MG PO TABS: 20.0000 mg | ORAL_TABLET | Freq: Every day | ORAL | 3 refills | Status: DC

## 2016-09-21 NOTE — Progress Notes (Signed)
Cardiology Office Note    Date:  09/21/2016   ID:  Luis [name], DOB Sep 18, 1955, MRN [id number]  PCP:  Laurey Morale, MD  Cardiologist:  Fransico Him, MD   Chief Complaint  Patient presents with  . Sleep Apnea  . Hypertension    History of Present Illness:  Luis [name] is a 61 y.o. male with a history of moderate AS, ascending aortic aneurysm, ASCAD and CHF.  He is here for followup of his OSA.  He has was diagnosed with OSA a few years ago and was on CPAP initially but stopped about a year ago because he could not breath through his nose.  He recently underwent PSG showing moderate OSA with an AHI of 24/hr and oxygen desaturations as low as 84%.  He is now on CPAP at 11cm H2O. He is now back for followup.  He is doing well with his CPAP device.  He tolerates the full face mask and feels the pressure is adequate.  He does have some problems with leakage due to his beard but he did not tolerate the nasal pillow mask.  Since going back on CPAP he has not felt more rested in the am but does have less daytime sleepiness.  He does have some problems with significant mouth dryness but if he goes any higher with the humidity he gets condensation in the tubing.  His wife says that she does not think he is snoring.     Past Medical History:  Diagnosis Date  . Anxiety    takes Valium as needed  . Aortic stenosis, moderate   . Arthritis   . Ascending aortic aneurysm ([place])   . CAD (coronary artery disease)    a. s/p PCI of the RCA 8/12 with DES by Dr Burt Knack, preserved EF. b. LHC/RHC (2/16) with mean RA 12, PA 32/15, mean PCWP 18, CI 3.47; patent mid and distal RCA stents, 50-60% proximal stenosis small PDA.   . Carotid stenosis    a. Carotid US (05/2013):  Bilateral 1-39% ICA; L thyroid nodule (prior hx of aspiration).  . Chronic diastolic CHF (congestive heart failure) ([place])   . Depression   . Essential hypertension   . GERD (gastroesophageal reflux disease)    if needed will take  OTC meds   . Heart murmur   . History of colonic polyps    hyperplastic  . Hyperlipidemia   . Joint pain   . Obesity (BMI 30-39.9) 02/29/2016  . Restless leg   . Sleep apnea    STOPBANG=5  . Vertigo    takes Meclizine as needed    Past Surgical History:  Procedure Laterality Date  . CATARACT EXTRACTION   4 YRS AGO   BOTH EYES  . CHOLECYSTECTOMY  07/21/2011   Procedure: LAPAROSCOPIC CHOLECYSTECTOMY WITH INTRAOPERATIVE CHOLANGIOGRAM;  Surgeon: Shann Medal, MD;  Location: [facility];  Service: General;  Laterality: N/A;  . CORONARY ANGIOPLASTY     2 stents  . coronary stenting     s/p PCI of the RCA by Dr Burt Knack 8/12 with 2 promus stents  . LEFT AND RIGHT HEART CATHETERIZATION WITH CORONARY ANGIOGRAM N/A 06/23/2014   Procedure: LEFT AND RIGHT HEART CATHETERIZATION WITH CORONARY ANGIOGRAM;  Surgeon: Larey Dresser, MD;  Location: [facility] CATH LAB;  Service: Cardiovascular;  Laterality: N/A;  . NECK SURGERY  03/23/09   per Dr. Lorin Mercy, cervical fusion   . right elbow surgery    . solonscopy  05/23/08  per Dr. Wynona Luna hemorrhoids only, repeat in 5 years  . TEE WITHOUT CARDIOVERSION N/A 06/23/2014   Procedure: TRANSESOPHAGEAL ECHOCARDIOGRAM (TEE);  Surgeon: Larey Dresser, MD;  Location: [facility];  Service: Cardiovascular;  Laterality: N/A;  . TEE WITHOUT CARDIOVERSION N/A 01/21/2016   Procedure: TRANSESOPHAGEAL ECHOCARDIOGRAM (TEE);  Surgeon: Larey Dresser, MD;  Location: [facility] ENDOSCOPY;  Service: Cardiovascular;  Laterality: N/A;  . TONSILLECTOMY      Current Medications: Current Meds  Medication Sig  . aspirin 81 MG tablet Take 81 mg by mouth every morning.   . diazepam (VALIUM) 5 MG tablet Take 1 tablet (5 mg total) by mouth every 8 (eight) hours as needed for anxiety.  [name] escitalopram (LEXAPRO) 20 MG tablet TAKE 1 TABLET (20 MG TOTAL) BY MOUTH DAILY.  . furosemide (LASIX) 40 MG tablet TAKE 1 TABLET (40 MG TOTAL) BY MOUTH DAILY.  [name] ibuprofen (ADVIL,MOTRIN) 200 MG tablet  Take 200 mg by mouth 2 (two) times daily as needed for moderate pain.   [name] ipratropium (ATROVENT) 0.03 % nasal spray Place 2 sprays into both nostrils every 12 (twelve) hours as needed for rhinitis.  [name] KLOR-CON 10 10 MEQ tablet TAKE 1 TABLET BY MOUTH DAILY  . meclizine (ANTIVERT) 25 MG tablet Take 25 mg by mouth 3 (three) times daily as needed for dizziness.  . nitroGLYCERIN (NITROSTAT) 0.4 MG SL tablet Place 1 tablet (0.4 mg total) under the tongue every 5 (five) minutes as needed. For chest pain.  . rosuvastatin (CRESTOR) 20 MG tablet TAKE 1 TABLET BY MOUTH AT BEDTIME    Allergies:   Patient has no known allergies.   Social History   Social History  . Marital status: Married    Spouse name: N/A  . Number of children: N/A  . Years of education: N/A   Occupational History  . [employer] operational officer   Social History Main Topics  . Smoking status: Former Research scientist (life sciences)  . Smokeless tobacco: Never Used     Comment: 5 yrs ago  . Alcohol use No  . Drug use: No  . Sexual activity: Not Asked   Other Topics Concern  . None   Social History Narrative  . None     Family History:  The patient's family history includes Cancer in his mother and other; Heart disease in his other.   ROS:   Please see the history of present illness.    ROS All other systems reviewed and are negative.  No flowsheet data found.     PHYSICAL EXAM:   VS:  BP 118/78   Pulse (!) 54   Ht 5\' 6"  (1.676 m)   Wt 245 lb 6.4 oz (111.3 kg)   SpO2 94%   BMI 39.61 kg/m    GEN: Well nourished, well developed, in no acute distress  HEENT: normal  Neck: no JVD, carotid bruits, or masses Cardiac: RRR; no rubs, or gallops,no edema.  Intact distal pulses bilaterally. 2/6 SM at RUSB Respiratory:  clear to auscultation bilaterally, normal work of breathing GI: soft, nontender, nondistended, + BS MS: no deformity or atrophy  Skin: warm and dry, no rash Neuro:  Alert and Oriented x 3, Strength and  sensation are intact Psych: euthymic mood, full affect  Wt Readings from Last 3 Encounters:  09/21/16 245 lb 6.4 oz (111.3 kg)  06/12/16 241 lb (109.3 kg)  06/02/16 241 lb (109.3 kg)      Studies/Labs Reviewed:   EKG:  EKG is not  ordered today.    Recent Labs: 11/27/2015: Brain Natriuretic Peptide 55.6; Magnesium 2.1; TSH 0.58 01/14/2016: BUN 17; Creat 1.08; Hemoglobin 13.5; Platelets 184; Potassium 4.0; Sodium 140   Lipid Panel    Component Value Date/Time   CHOL 161 11/26/2014 0911   TRIG 125.0 11/26/2014 0911   HDL 58.40 11/26/2014 0911   CHOLHDL 3 11/26/2014 0911   VLDL 25.0 11/26/2014 0911   LDLCALC 78 11/26/2014 0911   LDLDIRECT 145.6 03/16/2007 0908    Additional studies/ records that were reviewed today include:  CPAP download    ASSESSMENT:    1. OSA (obstructive sleep apnea)   2. Essential hypertension   3. Morbid obesity ([place])      PLAN:  In order of problems listed above:  OSA - the patient is tolerating PAP therapy well without any problems. The PAP download was reviewed today and showed an AHI of 3.5/hr on 13 cm H2O with 83% compliance in using more than 4 hours nightly.  The patient has been using and benefiting from CPAP use and will continue to benefit from therapy.  HTN - his BP is adequately controlled on exam today.  This is diet controlled.  Morbid obesity - His BMI is 40.  I have encouraged him to get into a routine exercise program and cut back on carbs and portions.     Medication Adjustments/Labs and Tests Ordered: Current medicines are reviewed at length with the patient today.  Concerns regarding medicines are outlined above.  Medication changes, Labs and Tests ordered today are listed in the Patient Instructions below.  There are no Patient Instructions on file for this visit.   Signed, Fransico Him, MD  09/21/2016 7:52 AM    [facility] [address], [place], [place]  [address] Phone: [phone]; Fax:  [phone]

## 2016-09-21 NOTE — Patient Instructions (Signed)
Medication Instructions:  Your physician recommends that you continue on your current medications as directed. Please refer to the Current Medication list given to you today.   Labwork: -None  Testing/Procedures: -None  Follow-Up: Your physician wants you to follow-up in: 1 year with Dr. Radford Pax.  You will receive a reminder letter in the mail two months in advance. If you don't receive a letter, please call our office to schedule the follow-up appointment.   Any Other Special Instructions Will Be Listed Below (If Applicable).  Patient is bring sleep download tomorrow.   If you need a refill on your cardiac medications before your next appointment, please call your pharmacy.

## 2016-10-20 ENCOUNTER — Encounter: Payer: Self-pay | Admitting: Family Medicine

## 2016-10-21 ENCOUNTER — Other Ambulatory Visit: Payer: Self-pay

## 2016-10-21 MED ORDER — MECLIZINE HCL 25 MG PO TABS: 25.0000 mg | ORAL_TABLET | Freq: Three times a day (TID) | ORAL | 0 refills | Status: DC | PRN

## 2016-11-10 ENCOUNTER — Encounter (HOSPITAL_COMMUNITY): Payer: Self-pay | Admitting: Cardiology

## 2016-11-10 ENCOUNTER — Ambulatory Visit (HOSPITAL_COMMUNITY)
Admission: RE | Admit: 2016-11-10 | Discharge: 2016-11-10 | Disposition: A | Discharge status: Home / Self Care | Payer: BC Managed Care – PPO | Source: Ambulatory Visit | Attending: Cardiology | Admitting: Cardiology

## 2016-11-10 VITALS — BP 132/78 | HR 55 | Wt 242.4 lb

## 2016-11-10 DIAGNOSIS — G4733 Obstructive sleep apnea (adult) (pediatric): Secondary | ICD-10-CM | POA: Insufficient documentation

## 2016-11-10 DIAGNOSIS — E669 Obesity, unspecified: Secondary | ICD-10-CM | POA: Insufficient documentation

## 2016-11-10 DIAGNOSIS — I712 Thoracic aortic aneurysm, without rupture: Secondary | ICD-10-CM | POA: Insufficient documentation

## 2016-11-10 DIAGNOSIS — Z87891 Personal history of nicotine dependence: Secondary | ICD-10-CM | POA: Insufficient documentation

## 2016-11-10 DIAGNOSIS — Z6839 Body mass index (BMI) 39.0-39.9, adult: Secondary | ICD-10-CM | POA: Insufficient documentation

## 2016-11-10 DIAGNOSIS — I2510 Atherosclerotic heart disease of native coronary artery without angina pectoris: Secondary | ICD-10-CM | POA: Insufficient documentation

## 2016-11-10 DIAGNOSIS — E785 Hyperlipidemia, unspecified: Secondary | ICD-10-CM | POA: Diagnosis not present

## 2016-11-10 DIAGNOSIS — I350 Nonrheumatic aortic (valve) stenosis: Secondary | ICD-10-CM | POA: Insufficient documentation

## 2016-11-10 DIAGNOSIS — R0609 Other forms of dyspnea: Secondary | ICD-10-CM | POA: Diagnosis not present

## 2016-11-10 DIAGNOSIS — I7121 Aneurysm of the ascending aorta, without rupture: Secondary | ICD-10-CM

## 2016-11-10 DIAGNOSIS — I5043 Acute on chronic combined systolic (congestive) and diastolic (congestive) heart failure: Secondary | ICD-10-CM

## 2016-11-10 DIAGNOSIS — I5032 Chronic diastolic (congestive) heart failure: Secondary | ICD-10-CM | POA: Insufficient documentation

## 2016-11-10 DIAGNOSIS — R0600 Dyspnea, unspecified: Secondary | ICD-10-CM

## 2016-11-10 DIAGNOSIS — Z7982 Long term (current) use of aspirin: Secondary | ICD-10-CM | POA: Diagnosis not present

## 2016-11-10 DIAGNOSIS — Z79899 Other long term (current) drug therapy: Secondary | ICD-10-CM | POA: Diagnosis not present

## 2016-11-10 DIAGNOSIS — I110 Hypertensive heart disease with heart failure: Secondary | ICD-10-CM | POA: Insufficient documentation

## 2016-11-10 LAB — LIPID PANEL
Cholesterol: 144 mg/dL (ref 0–200)
HDL: 48 mg/dL (ref >40)
LDL CALC: 53 mg/dL (ref 0–99)
Total CHOL/HDL Ratio: 3 RATIO
Triglycerides: 215 mg/dL — ABNORMAL HIGH (ref <150)
VLDL: 43 mg/dL — AB (ref 0–40)

## 2016-11-10 LAB — BASIC METABOLIC PANEL
ANION GAP: 7 (ref 5–15)
BUN: 19 mg/dL (ref 6–20)
CALCIUM: 8.8 mg/dL — AB (ref 8.9–10.3)
CO2: 28 mmol/L (ref 22–32)
Chloride: 105 mmol/L (ref 101–111)
Creatinine, Ser: 1.2 mg/dL (ref 0.61–1.24)
Glucose, Bld: 100 mg/dL — ABNORMAL HIGH (ref 65–99)
Potassium: 4.6 mmol/L (ref 3.5–5.1)
SODIUM: 140 mmol/L (ref 135–145)

## 2016-11-10 NOTE — Patient Instructions (Signed)
Will schedule you for chest CT and echocardiogram at [facility] in September 2018.  Routine lab work today. Will notify you of abnormal results, otherwise no news is good news!  No changes to medication at this time.  Follow up 6 months with Dr. Aundra Dubin. Take all medication as prescribed the day of your appointment. Bring all medications with you to your appointment.  Do the following things EVERYDAY: 1) Weigh yourself in the morning before breakfast. Write it down and keep it in a log. 2) Take your medicines as prescribed 3) Eat low salt foods-Limit salt (sodium) to 2000 mg per day.  4) Stay as active as you can everyday 5) Limit all fluids for the day to less than 2 liters

## 2016-11-11 ENCOUNTER — Telehealth (HOSPITAL_COMMUNITY): Payer: Self-pay | Admitting: *Deleted

## 2016-11-11 NOTE — Telephone Encounter (Signed)
Lipid Profile  Order: [id number]  Status:  Final result Visible to patient:  Yes (MyChart) Dx:  Hyperlipidemia, unspecified hyperlipi...  Notes recorded by Darron Doom, RN on 11/11/2016 at 12:12 PM EDT Called and spoke with patient he is aware and will try and work on lowering his fats/carbs. ------  Notes recorded by Larey Dresser, MD on 11/10/2016 at 10:35 PM EDT Lipids ok except TGs high. Cut back on fats/carbohydrates in diet.

## 2016-11-12 NOTE — Progress Notes (Signed)
Patient ID: Luis [name], male   DOB: 10-20-1955, 61 y.o.   MRN: [id number] PCP: Dr. Sarajane Jews Cardiology: Dr. Aundra Dubin  61 y.o. with history of CAD and moderate aortic stenosis presents for cardiology followup. He had RCA PCI in 8/12.     When I saw him in early 2016, he reported significant exertional dyspnea.  I had him do a Lexiscan Cardiolite in 2/16.  This showed no ischemia or infarction.  I also had him get an echo that showed ?bicuspid aortic valve and probably moderate aortic stenosis.  Given ongoing symptoms, I had him do a RHC/LHC and TEE in 2/16.  The RHC showed mildly elevated left and right heart filling pressures and the LHC showed nonobstructive CAD.  TEE confirmed bicuspid aortic valve with moderate AS.  He had PFTs in 4/16 that were within normal limits.  I started him on Lasix 20 mg daily.  He seemed to improve.    Progressive dypnea in fall 2017.  RHC/LHC in 2017 showed mildly elevated left heart filling pressure and nonobstructive CAD.  TEE showed moderate aortic stenosis, bicuspid valve.  Stable ascending aorta dilation.   Today,weight is up 4 lbs.  He is stable clinically.  Short of breath walking up an incline.  He tries to do some walking for exercise.  +bendopnea, no orthopnea/PND.  No palpitations.  No lightheadedness.  He is using CPAP and feels like he is sleeping better.   Labs (2/15): K 4.4, creatinine 0.9, LDL 71, HDL 53 Labs (2/16): K 4.8, creatinine 1.0, LDL 62, HDL 57 Labs (8/16): K 4.7, creatinine 0.95, TSH normal, HCT 41.2, LDL 78, HDL 58 Labs (8/17): K 4.3, creatinine 1.05, HCT 40.1, TSH normal, BNP 56 Labs (9/17): creatinine 1.08, BNP 56  ECG (personally reviewed): NSR, IVCD 122 msec  PMH: 1. OSA: Using CPAP.  2. CAD: Cardiolite 8/12 with inferior infarct and peri-infarct ischemia, had PCI to the mid and distal RCA with Promus DES x 2.  Lexiscan Cardiolite (2/16) with no ischemia or infarction. LHC/RHC (2/16) with mean RA 12, PA 32/15, mean PCWP 18, CI 3.47;  patent mid and distal RCA stents, 50-60% proximal stenosis small PDA.   - LHC/RHC (9/17): 60% proximal PDA; mean RA 2, PA 24/8, mean PCWP 18, CI 2.13.  - Cardiolite (8/17): EF 56%, normal.  3. Carotid stenosis: 2/15 carotid dopplers 1-39% bilateral ICA stenosis.  4. Colon polyps 5. HTN 6. Hyperlipidemia 7. Abdominal US (10/12) with no AAA 8. Aortic stenosis: Echo (2/15) with EF 60-65%, mild LVH, moderate diastolic dysfunction, normal RV size/systolic function, moderate aortic stenosis with mean gradient 31 and AVA 1.16 cm2, ascending aorta 4.4 cm.  Echo (2/16) with EF 60-65%, grade II diastolic dysfunction, ?bicuspid aortic valve with moderate AS (mean gradient 35 mmHg, AVA 1.5 cm^2), mildly dilated RV with normal systolic function.  TEE (2/16) with bicuspid aortic valve, moderate AS mean gradient 30 mmHg, AVA > 1 cm^2, EF 55-60%, mild LVH, mildly dilated RV with normal systolic function, mild AI, mild MR, ascending aorta 4.2 cm.  - Echo (8/17): EF 60-65%, grade II diastolic dysfunction, bicuspid aortic valve with mean gradient 31 mmHg, probably moderate stenosis.  - TEE (9/17): EF 55-60%, bicuspid aortic valve with moderate AS with mean gradient 24 mmHg and AVA 1.05 cm^2, ascending aorta 4.2 cm.  9. Ascending aorta aneurysm: 4.4 cm on 2/15 echo. Ascending aorta 4.2 cm TEE 2/16.  Ascending aorta 4.2 cm on 9/17 TEE.  10. Chronic diastolic CHF.  11. PFTs (4/16)  were within normal limits.  12. Lower extremity arterial dopplers normal in 8/17.   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  Sister with RyR2 gene.   ROS: All systems reviewed and negative except as per HPI.   Current Outpatient Prescriptions  Medication Sig Dispense Refill  . aspirin 81 MG tablet Take 81 mg by mouth every morning.     . diazepam (VALIUM) 5 MG tablet Take 1 tablet (5 mg total) by mouth every 8 (eight) hours as needed for anxiety. 60 tablet 2  . escitalopram (LEXAPRO) 20 MG tablet  TAKE 1 TABLET (20 MG TOTAL) BY MOUTH DAILY. 90 tablet 3  . furosemide (LASIX) 40 MG tablet TAKE 1 TABLET (40 MG TOTAL) BY MOUTH DAILY. 90 tablet 1  . ibuprofen (ADVIL,MOTRIN) 200 MG tablet Take 200 mg by mouth 2 (two) times daily as needed for moderate pain.     [name] ipratropium (ATROVENT) 0.03 % nasal spray Place 2 sprays into both nostrils every 12 (twelve) hours as needed for rhinitis.    [name] KLOR-CON 10 10 MEQ tablet TAKE 1 TABLET BY MOUTH DAILY 30 tablet 11  . meclizine (ANTIVERT) 25 MG tablet Take 1 tablet (25 mg total) by mouth 3 (three) times daily as needed for dizziness. 30 tablet 0  . nitroGLYCERIN (NITROSTAT) 0.4 MG SL tablet Place 1 tablet (0.4 mg total) under the tongue every 5 (five) minutes as needed. For chest pain. 25 tablet 0  . rosuvastatin (CRESTOR) 20 MG tablet Take 1 tablet (20 mg total) by mouth at bedtime. 90 tablet 3   No current facility-administered medications for this encounter.    BP 132/78   Pulse (!) 55   Wt 242 lb 6.4 oz (110 kg)   SpO2 98%   BMI 39.12 kg/m  General: NAD Neck: Thick, no JVD, no thyromegaly or thyroid nodule.  Lungs: Clear to auscultation bilaterally with normal respiratory effort. CV: Nondisplaced PMI.  Heart regular S1/S2, no O9/B3, 3/6 systolic murmur RUSB with clear S2.  No peripheral edema.  No carotid bruit.  2+ PT pulses bilaterally.  Abdomen: Soft, nontender, no hepatosplenomegaly, no distention.  Skin: Intact without lesions or rashes.  Neurologic: Alert and oriented x 3.  Psych: Normal affect. Extremities: No clubbing or cyanosis.  HEENT: Normal.   Assessment/Plan: 1. CAD: Prior PCI to RCA in 8/12.  LHC (9/17) with nonobstructive disease.  I had started him on Imdur for ?microvascular angina but he developed severe headaches without any improvement in symptoms.  No chest pain, stable exertional dyspnea.  - Continue ASA 81 and Crestor.  2. Aortic stenosis: Bicuspid valve with moderate AS by TEE 9/17.  - Repeat echo at 1 year in  9/18 given ongoing dyspnea (though this seems stable).  3. Hyperlipidemia: Check lipids. .  4. Ascending aortic aneurysm: Associated with bicuspid aortic valve.   - CTA chest to follow in 9/18.  5. Chronic diastolic CHF:  He is euvolemic on exam, NYHA class II-III.  Continue Lasix 40 mg daily.  BMET today.  6.  OSA: Continue CPAP.  7. Exertional dyspnea: I think obesity and deconditioning plays a big role.  I encouraged diet and exercise.   Followup in 6 months.      [name] 11/12/2016

## 2016-11-15 ENCOUNTER — Telehealth (HOSPITAL_COMMUNITY): Payer: Self-pay | Admitting: *Deleted

## 2016-11-15 NOTE — Telephone Encounter (Signed)
-----   Message from Larey Dresser, MD sent at 11/14/2016  5:42 PM EDT ----- Not sure would change anything right now, would have her let us know if this recurs. May have been vagal event.   ----- Message ----- From: Darron Doom, RN Sent: 11/14/2016  10:01 AM To: Larey Dresser, MD  Patient's wife called triage stating that after his visit with [place] last Thursday he was sitting in the dentist office and starting feeling very dizzy/lightheaded and sweats for about 10 mins.  He then had a headache the rest of the day.  Patient's wife is wanting [place] to advise him on what he should do?

## 2016-11-15 NOTE — Telephone Encounter (Signed)
Called and left detailed message to call us back if he were to have anymore events and that we would not be making any changes at this time.

## 2016-11-17 ENCOUNTER — Other Ambulatory Visit: Payer: Self-pay | Admitting: Cardiology

## 2016-12-05 ENCOUNTER — Telehealth (HOSPITAL_COMMUNITY): Payer: Self-pay

## 2016-12-05 NOTE — Telephone Encounter (Signed)
Patient calling CHF clinic to report another "episode/spell". States not as severe as the other ones that usually cause feeling that he will faint. Patient reports being at [employer], standing, started feeling "jittery and lightheaded and had to sit down", sitting down did not make symptoms resolve, and only after 20 minutes did the symptoms slowly go away on their own.  States he was diaphoretic by the end of the episode. Denies CP, SOB, and any other s/s. States he was checked out by EMS and EKG, CBG, and vitals were all WNL. Dr. Claris Gladden previous notes state these are likely vasovagal episodes and have not warranted changes in the past, just close follow up. Patient scheduled for echo and cardiac ct in 4 weeks per Dr. Claris Gladden orders, due to follow up with Dr. Aundra Dubin 04/2017. Will forward to Dr. Aundra Dubin to advise further if needed. Advised patient to return call to clinic for recurrent s/s.  Renee Pain, RN

## 2017-01-04 ENCOUNTER — Ambulatory Visit (HOSPITAL_BASED_OUTPATIENT_CLINIC_OR_DEPARTMENT_OTHER)
Admission: RE | Admit: 2017-01-04 | Discharge: 2017-01-04 | Disposition: A | Discharge status: Home / Self Care | Payer: BC Managed Care – PPO | Source: Ambulatory Visit

## 2017-01-04 ENCOUNTER — Ambulatory Visit (HOSPITAL_COMMUNITY)
Admission: RE | Admit: 2017-01-04 | Discharge: 2017-01-04 | Disposition: A | Discharge status: Home / Self Care | Payer: BC Managed Care – PPO | Source: Ambulatory Visit | Attending: Cardiology | Admitting: Cardiology

## 2017-01-04 DIAGNOSIS — I5032 Chronic diastolic (congestive) heart failure: Secondary | ICD-10-CM

## 2017-01-04 DIAGNOSIS — I2510 Atherosclerotic heart disease of native coronary artery without angina pectoris: Secondary | ICD-10-CM | POA: Insufficient documentation

## 2017-01-04 DIAGNOSIS — I350 Nonrheumatic aortic (valve) stenosis: Secondary | ICD-10-CM

## 2017-01-04 DIAGNOSIS — E785 Hyperlipidemia, unspecified: Secondary | ICD-10-CM | POA: Insufficient documentation

## 2017-01-04 DIAGNOSIS — I110 Hypertensive heart disease with heart failure: Secondary | ICD-10-CM | POA: Diagnosis not present

## 2017-01-04 DIAGNOSIS — R918 Other nonspecific abnormal finding of lung field: Secondary | ICD-10-CM | POA: Insufficient documentation

## 2017-01-04 DIAGNOSIS — I7121 Aneurysm of the ascending aorta, without rupture: Secondary | ICD-10-CM

## 2017-01-04 DIAGNOSIS — I712 Thoracic aortic aneurysm, without rupture: Secondary | ICD-10-CM

## 2017-01-04 MED ORDER — IOPAMIDOL (ISOVUE-370) INJECTION 76%
INTRAVENOUS | Status: AC
  Administered 2017-01-04: 100 mL
  Filled 2017-01-04: qty 100

## 2017-01-04 NOTE — Progress Notes (Signed)
  Echocardiogram 2D Echocardiogram has been performed.  Luis [name] 01/04/2017, 3:07 PM

## 2017-01-05 LAB — POCT I-STAT CREATININE: Creatinine, Ser: 1.1 mg/dL (ref 0.61–1.24)

## 2017-01-06 ENCOUNTER — Telehealth (HOSPITAL_COMMUNITY): Payer: Self-pay

## 2017-01-06 NOTE — Telephone Encounter (Signed)
Stable 4.5 cm ascending aortic aneurysm. Repeat in 1 year.     CT ANGIO CHEST PE W OR WO CONTRAST  Order: [id number]  Status:  Final result Visible to patient:  Yes (MyChart) Dx:  Ascending aortic aneurysm ([place])  Notes recorded by Shirley Muscat, RN on 01/06/2017 at 9:35 AM EDT Pt aware of results  ------  Notes recorded by Larey Dresser, MD on 01/05/2017 at 10:14 PM EDT Stable 4.5 cm ascending aortic aneurysm. Repeat in 1 year.    ______________________________________________  Notes recorded by Shirley Muscat, RN on 01/06/2017 at 9:35 AM EDT Pt aware of results  ------  Notes recorded by Larey Dresser, MD on 01/04/2017 at 4:17 PM EDT Probably moderate AS, normal EF.

## 2017-01-12 ENCOUNTER — Encounter: Payer: Self-pay | Admitting: Family Medicine

## 2017-01-13 ENCOUNTER — Other Ambulatory Visit: Payer: Self-pay | Admitting: Cardiology

## 2017-05-04 ENCOUNTER — Telehealth: Payer: Self-pay

## 2017-05-04 NOTE — Telephone Encounter (Signed)
Alternative requested: Pt payng 18 for escit. Pt is requesting lower out of pocket cost. See alternatives and associated cost: cital 3, serta. 8

## 2017-05-05 NOTE — Telephone Encounter (Signed)
Called pt and left a VM to call back to make sure they are aware of the change.

## 2017-05-05 NOTE — Telephone Encounter (Signed)
Sent to PCP for alterative option for pt.

## 2017-05-05 NOTE — Telephone Encounter (Signed)
Stop Lexapro and call in Citalopram 40 mg daily. Call in #90 with 3 rf

## 2017-05-09 NOTE — Telephone Encounter (Signed)
Called pt and left a VM to call back.  

## 2017-05-09 NOTE — Telephone Encounter (Signed)
Called pt and left a VM on pt's mobile number want to make sure pt is aware of this change before we sent Rx in.

## 2017-05-12 NOTE — Telephone Encounter (Signed)
Pt called back and did inform him of Dr. Murrell Redden message. Please call if any other information needs to be shared.

## 2017-05-15 MED ORDER — CITALOPRAM HYDROBROMIDE 40 MG PO TABS: 40.0000 mg | ORAL_TABLET | Freq: Every day | ORAL | 3 refills | Status: DC

## 2017-05-15 NOTE — Addendum Note (Signed)
Addended by: Myriam Forehand on: 05/15/2017 11:49 AM   Modules accepted: Orders

## 2017-05-15 NOTE — Telephone Encounter (Signed)
Rx sent for Citalopram 40 mg daily. Call in #90 with 3 rf. Lexapro removed for pt's med list.

## 2017-06-07 ENCOUNTER — Other Ambulatory Visit: Payer: Self-pay | Admitting: Physician Assistant

## 2017-07-14 ENCOUNTER — Other Ambulatory Visit: Payer: Self-pay | Admitting: Cardiology

## 2017-09-11 ENCOUNTER — Other Ambulatory Visit (HOSPITAL_COMMUNITY): Payer: Self-pay | Admitting: Cardiology

## 2017-09-18 ENCOUNTER — Other Ambulatory Visit: Payer: Self-pay | Admitting: Cardiology

## 2017-11-03 ENCOUNTER — Encounter (HOSPITAL_COMMUNITY): Payer: Self-pay | Admitting: Cardiology

## 2017-11-03 ENCOUNTER — Other Ambulatory Visit: Payer: Self-pay

## 2017-11-03 ENCOUNTER — Ambulatory Visit (HOSPITAL_COMMUNITY)
Admission: RE | Admit: 2017-11-03 | Discharge: 2017-11-03 | Disposition: A | Discharge status: Home / Self Care | Payer: BC Managed Care – PPO | Source: Ambulatory Visit | Attending: Cardiology | Admitting: Cardiology

## 2017-11-03 VITALS — BP 133/75 | HR 56 | Wt 258.0 lb

## 2017-11-03 DIAGNOSIS — I491 Atrial premature depolarization: Secondary | ICD-10-CM | POA: Insufficient documentation

## 2017-11-03 DIAGNOSIS — I110 Hypertensive heart disease with heart failure: Secondary | ICD-10-CM | POA: Insufficient documentation

## 2017-11-03 DIAGNOSIS — I440 Atrioventricular block, first degree: Secondary | ICD-10-CM | POA: Diagnosis not present

## 2017-11-03 DIAGNOSIS — Z87891 Personal history of nicotine dependence: Secondary | ICD-10-CM | POA: Diagnosis not present

## 2017-11-03 DIAGNOSIS — I350 Nonrheumatic aortic (valve) stenosis: Secondary | ICD-10-CM | POA: Diagnosis not present

## 2017-11-03 DIAGNOSIS — I2510 Atherosclerotic heart disease of native coronary artery without angina pectoris: Secondary | ICD-10-CM | POA: Diagnosis not present

## 2017-11-03 DIAGNOSIS — Z7982 Long term (current) use of aspirin: Secondary | ICD-10-CM | POA: Insufficient documentation

## 2017-11-03 DIAGNOSIS — G4733 Obstructive sleep apnea (adult) (pediatric): Secondary | ICD-10-CM | POA: Diagnosis not present

## 2017-11-03 DIAGNOSIS — I712 Thoracic aortic aneurysm, without rupture: Secondary | ICD-10-CM | POA: Diagnosis not present

## 2017-11-03 DIAGNOSIS — I7121 Aneurysm of the ascending aorta, without rupture: Secondary | ICD-10-CM

## 2017-11-03 DIAGNOSIS — I5032 Chronic diastolic (congestive) heart failure: Secondary | ICD-10-CM

## 2017-11-03 DIAGNOSIS — E785 Hyperlipidemia, unspecified: Secondary | ICD-10-CM

## 2017-11-03 DIAGNOSIS — Z79899 Other long term (current) drug therapy: Secondary | ICD-10-CM | POA: Diagnosis not present

## 2017-11-03 LAB — LIPID PANEL
CHOL/HDL RATIO: 3.1 ratio
Cholesterol: 159 mg/dL (ref 0–200)
HDL: 51 mg/dL (ref >40)
LDL CALC: 68 mg/dL (ref 0–99)
TRIGLYCERIDES: 200 mg/dL — AB (ref <150)
VLDL: 40 mg/dL (ref 0–40)

## 2017-11-03 LAB — BASIC METABOLIC PANEL
Anion gap: 8 (ref 5–15)
BUN: 20 mg/dL (ref 8–23)
CALCIUM: 9.2 mg/dL (ref 8.9–10.3)
CO2: 30 mmol/L (ref 22–32)
CREATININE: 1.13 mg/dL (ref 0.61–1.24)
Chloride: 105 mmol/L (ref 98–111)
GFR calc Af Amer: >60 mL/min (ref >60)
GLUCOSE: 118 mg/dL — AB (ref 70–99)
POTASSIUM: 4.7 mmol/L (ref 3.5–5.1)
SODIUM: 143 mmol/L (ref 135–145)

## 2017-11-03 MED ORDER — POTASSIUM CHLORIDE ER 10 MEQ PO TBCR: 10.0000 meq | EXTENDED_RELEASE_TABLET | Freq: Every day | ORAL | 11 refills | Status: DC

## 2017-11-03 MED ORDER — NITROGLYCERIN 0.4 MG SL SUBL: 0.4000 mg | SUBLINGUAL_TABLET | SUBLINGUAL | 2 refills | Status: DC | PRN

## 2017-11-03 MED ORDER — ROSUVASTATIN CALCIUM 20 MG PO TABS: 20.0000 mg | ORAL_TABLET | Freq: Every day | ORAL | 3 refills | Status: DC

## 2017-11-03 MED ORDER — FUROSEMIDE 40 MG PO TABS: 40.0000 mg | ORAL_TABLET | Freq: Every day | ORAL | 11 refills | Status: DC

## 2017-11-03 NOTE — Patient Instructions (Signed)
Labs today  Your physician has requested that you have an echocardiogram. Echocardiography is a painless test that uses sound waves to create images of your heart. It provides your doctor with information about the size and shape of your heart and how well your heart's chambers and valves are working. This procedure takes approximately one hour. There are no restrictions for this procedure.  IN SEPT.  Non-Cardiac CT Angiography (CTA), is a special type of CT scan that uses a computer to produce multi-dimensional views of major blood vessels throughout the body. In CT angiography, a contrast material is injected through an IV to help visualize the blood vessels.  IN SEPT.  Your physician has requested that you regularly monitor and record your blood pressure readings at home. Please use the same machine at the same time of day to check your readings and record them.  PLEASE CALL us WITH READINGS IN 2 WEEKS  We will contact you in 6 months to schedule your next appointment.

## 2017-11-05 NOTE — Progress Notes (Signed)
Patient ID: Luis [name], male   DOB: 12-Jul-1955, 62 y.o.   MRN: [id number] PCP: Dr. Sarajane Jews Cardiology: Dr. Aundra Dubin  62 y.o. with history of CAD and moderate aortic stenosis presents for cardiology followup. He had RCA PCI in 8/12.     When I saw him in early 62, he reported significant exertional dyspnea. he reported significant exertional dyspnea.  I had him do a Lexiscan Cardiolite in 2/16.  This showed no ischemia or infarction.  I also had him get an echo that showed ?bicuspid aortic valve and probably moderate aortic stenosis.  Given ongoing symptoms, I had him do a RHC/LHC and TEE in 2/16.  The RHC showed mildly elevated left and right heart filling pressures and the LHC showed nonobstructive CAD.  TEE confirmed bicuspid aortic valve with moderate AS.  He had PFTs in 4/16 that were within normal limits.  I started him on Lasix 20 mg daily.  He seemed to improve.    Progressive dypnea in fall 2017.  RHC/LHC in 2017 showed mildly elevated left heart filling pressure and nonobstructive CAD.  TEE showed moderate aortic stenosis, bicuspid valve.  Stable ascending aorta dilation.   Echo in 9/18 showed EF 60-65% with bicuspid aortic valve and moderate AS.  CTA chest showed 4.5 cm ascending aorta.   Today, weight is up 16 lbs.  He says that his weight has gone up steadily since he quit smoking.  Not getting much exercise.  He does get out and do some yardwork. He does ok walking on flat ground but is short of breath going up inclines.  No chest pain.  No claudication.     Labs (2/15): K 4.4, creatinine 0.9, LDL 71, HDL 53 Labs (2/16): K 4.8, creatinine 1.0, LDL 62, HDL 57 Labs (8/16): K 4.7, creatinine 0.95, TSH normal, HCT 41.2, LDL 78, HDL 58 Labs (8/17): K 4.3, creatinine 1.05, HCT 40.1, TSH normal, BNP 56 Labs (9/17): creatinine 1.08, BNP 56 Labs (7/18): creatinine 1.2, LDL 53  ECG (personally reviewed): NSR, 1st degree AVB, PACs  PMH: 1. OSA: Using CPAP.  2. CAD: Cardiolite 8/12 with inferior infarct and peri-infarct ischemia, had  PCI to the mid and distal RCA with Promus DES x 2.  Lexiscan Cardiolite (2/16) with no ischemia or infarction. LHC/RHC (2/16) with mean RA 12, PA 32/15, mean PCWP 18, CI 3.47; patent mid and distal RCA stents, 50-60% proximal stenosis small PDA.   - LHC/RHC (9/17): 60% proximal PDA; mean RA 2, PA 24/8, mean PCWP 18, CI 2.13.  - Cardiolite (8/17): EF 56%, normal.  3. Carotid stenosis: 2/15 carotid dopplers 1-39% bilateral ICA stenosis.  4. Colon polyps 5. HTN 6. Hyperlipidemia 7. Abdominal US (10/12) with no AAA 8. Aortic stenosis: Echo (2/15) with EF 60-65%, mild LVH, moderate diastolic dysfunction, normal RV size/systolic function, moderate aortic stenosis with mean gradient 31 and AVA 1.16 cm2, ascending aorta 4.4 cm.  Echo (2/16) with EF 60-65%, grade II diastolic dysfunction, ?bicuspid aortic valve with moderate AS (mean gradient 35 mmHg, AVA 1.5 cm^2), mildly dilated RV with normal systolic function.  TEE (2/16) with bicuspid aortic valve, moderate AS mean gradient 30 mmHg, AVA > 1 cm^2, EF 55-60%, mild LVH, mildly dilated RV with normal systolic function, mild AI, mild MR, ascending aorta 4.2 cm.  - Echo (8/17): EF 60-65%, grade II diastolic dysfunction, bicuspid aortic valve with mean gradient 31 mmHg, probably moderate stenosis.  - TEE (9/17): EF 55-60%, bicuspid aortic valve with moderate AS with mean gradient 24 mmHg and AVA  1.05 cm^2, ascending aorta 4.2 cm.  - Echo (9/18): EF 60-65%, bicuspid aortic valve, AVA 1.05 cm^2 with mean gradient 24 mmHg (moderate AS).  9. Ascending aorta aneurysm: 4.4 cm on 2/15 echo. Ascending aorta 4.2 cm TEE 2/16.  Ascending aorta 4.2 cm on 9/17 TEE.  - CTA chest (9/18): 4.5 cm ascending aorta.  10. Chronic diastolic CHF.  11. PFTs (4/16) were within normal limits.  12. Lower extremity arterial dopplers normal in 8/17.   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  Sister with RyR2 gene.   ROS: All systems reviewed  and negative except as per HPI.   Current Outpatient Medications  Medication Sig Dispense Refill  . aspirin 81 MG tablet Take 81 mg by mouth every morning.     . citalopram (CELEXA) 40 MG tablet Take 1 tablet (40 mg total) by mouth daily. 90 tablet 3  . diazepam (VALIUM) 5 MG tablet Take 1 tablet (5 mg total) by mouth every 8 (eight) hours as needed for anxiety. 60 tablet 2  . furosemide (LASIX) 40 MG tablet Take 1 tablet (40 mg total) by mouth daily. 30 tablet 11  . ibuprofen (ADVIL,MOTRIN) 200 MG tablet Take 200 mg by mouth 2 (two) times daily as needed for moderate pain.     [name] ipratropium (ATROVENT) 0.03 % nasal spray Place 2 sprays into both nostrils every 12 (twelve) hours as needed for rhinitis.    [name] meclizine (ANTIVERT) 25 MG tablet Take 1 tablet (25 mg total) by mouth 3 (three) times daily as needed for dizziness. 30 tablet 0  . nitroGLYCERIN (NITROSTAT) 0.4 MG SL tablet Place 1 tablet (0.4 mg total) under the tongue every 5 (five) minutes as needed. For chest pain. 25 tablet 2  . potassium chloride (KLOR-CON 10) 10 MEQ tablet Take 1 tablet (10 mEq total) by mouth daily. 30 tablet 11  . rosuvastatin (CRESTOR) 20 MG tablet Take 1 tablet (20 mg total) by mouth at bedtime. 90 tablet 3   No current facility-administered medications for this encounter.    BP 133/75   Pulse (!) 56   Wt 258 lb (117 kg)   SpO2 96%   BMI 41.64 kg/m  General: NAD Neck: No JVD, no thyromegaly or thyroid nodule.  Lungs: Clear to auscultation bilaterally with normal respiratory effort. CV: Nondisplaced PMI.  Heart regular S1/S2, no I2/L7, 3/6 systolic murmur RUSB with clear S2, murmur radiates to the neck.  No peripheral edema.  Normal pedal pulses.  Abdomen: Soft, nontender, no hepatosplenomegaly, no distention.  Skin: Intact without lesions or rashes.  Neurologic: Alert and oriented x 3.  Psych: Normal affect. Extremities: No clubbing or cyanosis.  HEENT: Normal.   Assessment/Plan: 1. CAD: Prior PCI  to RCA in 8/12.  LHC (9/17) with nonobstructive disease.  I had started him on Imdur for ?microvascular angina but he developed severe headaches without any improvement in symptoms.  No chest pain, stable exertional dyspnea.  - Continue ASA 81 and Crestor.  2. Aortic stenosis: Bicuspid valve with moderate AS by echo 9/18.  - Repeat echo 9/19.   3. Hyperlipidemia: Check lipids today.  4. Ascending aortic aneurysm: Associated with bicuspid aortic valve.  4.5 cm ascending aorta in 9/18.  - Repeat CTA chest 9/19 to follow aortic aneurysm.  5. Chronic diastolic CHF:  He is euvolemic on exam, NYHA class II.   - Continue Lasix 40 mg daily.   - BMET today.  6.  OSA: Continue CPAP.  7. HTN: With ascending aortic aneurysm, need good BP control.  Will have him check BP daily x 1 week and call in results.    Followup in 6 months.      [name] 11/05/2017

## 2017-11-20 ENCOUNTER — Telehealth (HOSPITAL_COMMUNITY): Payer: Self-pay

## 2017-11-20 ENCOUNTER — Other Ambulatory Visit (HOSPITAL_COMMUNITY): Payer: Self-pay

## 2017-11-20 DIAGNOSIS — I5032 Chronic diastolic (congestive) heart failure: Secondary | ICD-10-CM

## 2017-11-20 MED ORDER — LOSARTAN POTASSIUM 50 MG PO TABS: 50.0000 mg | ORAL_TABLET | Freq: Every day | ORAL | 0 refills | Status: DC

## 2017-11-20 NOTE — Telephone Encounter (Signed)
Pt's wife called to notify provider of the last 2 weeks of bp and pulse readings.  7/13-131/73   56 7/14-142/25   58 7/15-145/85   57 7/16-133/78   57 7/17-142/86   54 7/18-155/81   59 7/19-165/95   55 7/20-143/81   55 7/21-142/84   63 7/22-145/80   50 7/23-144/79   59 7/24-148/83   53 7/25-146/81   60 7/26-146/83   53 7/29-158/86   58

## 2017-11-20 NOTE — Telephone Encounter (Signed)
Left VM for pt to call office

## 2017-11-20 NOTE — Telephone Encounter (Signed)
Pt called back and was understandable. Lab appt made.

## 2017-11-20 NOTE — Telephone Encounter (Signed)
Would start losartan 50 mg daily with BMET in 10 days.  BP high, has ascending aortic aneurysm.  HR in 50s fairly frequently so would not use beta blocker.

## 2017-11-30 ENCOUNTER — Ambulatory Visit (HOSPITAL_COMMUNITY)
Admission: RE | Admit: 2017-11-30 | Discharge: 2017-11-30 | Disposition: A | Discharge status: Home / Self Care | Payer: BC Managed Care – PPO | Source: Ambulatory Visit | Attending: Internal Medicine | Admitting: Internal Medicine

## 2017-11-30 DIAGNOSIS — I5032 Chronic diastolic (congestive) heart failure: Secondary | ICD-10-CM | POA: Diagnosis not present

## 2017-11-30 LAB — BASIC METABOLIC PANEL
ANION GAP: 6 (ref 5–15)
BUN: 18 mg/dL (ref 8–23)
CALCIUM: 9 mg/dL (ref 8.9–10.3)
CO2: 30 mmol/L (ref 22–32)
Chloride: 103 mmol/L (ref 98–111)
Creatinine, Ser: 1.09 mg/dL (ref 0.61–1.24)
GFR calc Af Amer: >60 mL/min (ref >60)
GFR calc non Af Amer: >60 mL/min (ref >60)
GLUCOSE: 97 mg/dL (ref 70–99)
Potassium: 5 mmol/L (ref 3.5–5.1)
SODIUM: 139 mmol/L (ref 135–145)

## 2017-12-20 ENCOUNTER — Ambulatory Visit (HOSPITAL_COMMUNITY)
Admission: RE | Admit: 2017-12-20 | Discharge: 2017-12-20 | Disposition: A | Discharge status: Home / Self Care | Payer: BC Managed Care – PPO | Source: Ambulatory Visit | Attending: Cardiology | Admitting: Cardiology

## 2017-12-20 DIAGNOSIS — I712 Thoracic aortic aneurysm, without rupture: Secondary | ICD-10-CM | POA: Insufficient documentation

## 2017-12-20 DIAGNOSIS — I7121 Aneurysm of the ascending aorta, without rupture: Secondary | ICD-10-CM

## 2017-12-20 DIAGNOSIS — I700 Atherosclerosis of aorta: Secondary | ICD-10-CM | POA: Diagnosis not present

## 2017-12-20 MED ORDER — IOPAMIDOL (ISOVUE-370) INJECTION 76%
INTRAVENOUS | Status: AC
  Filled 2017-12-20: qty 100

## 2017-12-20 MED ORDER — IOPAMIDOL (ISOVUE-370) INJECTION 76%
100.0000 mL | Freq: Once | INTRAVENOUS | Status: AC | PRN
  Administered 2017-12-20: 100 mL via INTRAVENOUS

## 2017-12-22 ENCOUNTER — Telehealth (HOSPITAL_COMMUNITY): Payer: Self-pay | Admitting: *Deleted

## 2017-12-22 NOTE — Telephone Encounter (Signed)
Result Notes for CT ANGIO CHEST AORTA W &/OR WO CONTRAST   Notes recorded by Darron Doom, RN on 12/22/2017 at 10:23 AM EDT Called and spoke with patient's wife, she's aware and no further questions. ------  Notes recorded by Larey Dresser, MD on 12/20/2017 at 9:04 PM EDT Stable 4.5 cm ascending aorta. Can repeat study in 1 year

## 2018-02-12 ENCOUNTER — Other Ambulatory Visit (HOSPITAL_COMMUNITY): Payer: Self-pay | Admitting: Cardiology

## 2018-04-16 ENCOUNTER — Telehealth: Payer: Self-pay | Admitting: *Deleted

## 2018-04-16 DIAGNOSIS — K648 Other hemorrhoids: Secondary | ICD-10-CM

## 2018-04-16 NOTE — Telephone Encounter (Signed)
The referral was done  

## 2018-04-16 NOTE — Telephone Encounter (Signed)
Copied from [place] [id number]. Topic: Referral - Request for Referral >> Apr 16, 2018  3:12 PM Marin Olp L wrote: Has patient seen PCP for this complaint? Yes.   *If NO, is insurance requiring patient see PCP for this issue before PCP can refer them? Referral for which specialty: GI Preferred provider/office: WHEREVER IS COVERED BY INSURANCE Reason for referral: HEMORRHOIDS, BLOOD IN STOOL, PAINFUL

## 2018-04-20 ENCOUNTER — Ambulatory Visit: Payer: BC Managed Care – PPO | Admitting: Gastroenterology

## 2018-04-28 ENCOUNTER — Other Ambulatory Visit: Payer: Self-pay | Admitting: Family Medicine

## 2018-05-01 ENCOUNTER — Encounter: Payer: Self-pay | Admitting: Nurse Practitioner

## 2018-05-01 ENCOUNTER — Ambulatory Visit: Payer: BC Managed Care – PPO | Admitting: Nurse Practitioner

## 2018-05-01 VITALS — BP 124/70 | HR 76 | Ht 66.0 in | Wt 266.8 lb

## 2018-05-01 DIAGNOSIS — K6289 Other specified diseases of anus and rectum: Secondary | ICD-10-CM

## 2018-05-01 DIAGNOSIS — K625 Hemorrhage of anus and rectum: Secondary | ICD-10-CM

## 2018-05-01 MED ORDER — DILTIAZEM GEL 2 %: 1.0000 "application " | Freq: Three times a day (TID) | CUTANEOUS | 0 refills | Status: DC

## 2018-05-01 NOTE — Progress Notes (Addendum)
ASSESSMENT / PLAN:   63 year old male with 2-year history of left sided rectal pain and rectal bleeding.  Based on history the patient provides me today as well as exam, I suspect he has a fissure just inside anus.  The fissure is in an unusual place (left lateral) however.  -Diltiazem gel 3 times daily x 8 weeks.  I  explained how to administer the medication -Soaks 3 times daily when able -Follow-up with me in 8 weeks to document visual healing or proceed with further work-up if symptoms not resolved -Advised patient to keep stools soft and avoid straining.  He is not having any issues with his bowels at present  History of adenomatous colon polyps.  Due for surveillance colonoscopy 2021  Addendum: Reviewed and agree with assessment and management plan. Jerene Bears, MD    HPI:    Chief Complaint:   Rectal pain and rectal bleeding  Patient is a 63 year old male with multiple medical problems not limited to AAA, hypertension, chronic diastolic heart failure, CAD, carotid stenosis, and sleep apnea. He known to Dr. Hilarie Fredrickson for a history of adenomatous colon polyps.  Due for surveillance colonoscopy in 2021.  Patient is referred by PCP, Dr. Delma Freeze for evaluation of chronic rectal pain and rectal bleeding.  He says this has been going on for approximately 2 years.  He has pain with every bowel movement but bleeding occurs generally with just the first bowel movement of the day.  No abdominal pain.  The rectal pain is described as stinging and burning.  The pain lasts for hours.  It hurts to sit down at times.  He mainly feels the pain on the left.  Patient denies constipation/straining.  He takes a baby aspirin, not anticoagulated.  His weight is up several pounds from 258 in July to 266 pounds today.   ENDOSCOPIC IMPRESSION: 1. Sessile polyp was found in the descending colon; polypectomy was performed with a cold snare 2. The examination was otherwise normal Polyp  path, tubular adenoma without HGD   Past Medical History:  Diagnosis Date  . Anxiety    takes Valium as needed  . Aortic stenosis, moderate   . Arthritis   . Ascending aortic aneurysm ([place])   . CAD (coronary artery disease)    a. s/p PCI of the RCA 8/12 with DES by Dr Burt Knack, preserved EF. b. LHC/RHC (2/16) with mean RA 12, PA 32/15, mean PCWP 18, CI 3.47; patent mid and distal RCA stents, 50-60% proximal stenosis small PDA.   . Carotid stenosis    a. Carotid US (05/2013):  Bilateral 1-39% ICA; L thyroid nodule (prior hx of aspiration).  . Chronic diastolic CHF (congestive heart failure) ([place])   . Depression   . Essential hypertension   . GERD (gastroesophageal reflux disease)    if needed will take OTC meds   . Heart murmur   . History of colonic polyps    hyperplastic  . Hyperlipidemia   . Joint pain   . Obesity (BMI 30-39.9) 02/29/2016  . Restless leg   . Sleep apnea    STOPBANG=5  . Vertigo    takes Meclizine as needed     Past Surgical History:  Procedure Laterality Date  . CATARACT EXTRACTION   4 YRS AGO   BOTH EYES  . CHOLECYSTECTOMY  07/21/2011   Procedure: LAPAROSCOPIC CHOLECYSTECTOMY WITH INTRAOPERATIVE CHOLANGIOGRAM;  Surgeon: Fenton Malling  Lucia Gaskins, MD;  Location: [facility];  Service: General;  Laterality: N/A;  . CORONARY ANGIOPLASTY     2 stents  . coronary stenting     s/p PCI of the RCA by Dr Burt Knack 8/12 with 2 promus stents  . LEFT AND RIGHT HEART CATHETERIZATION WITH CORONARY ANGIOGRAM N/A 06/23/2014   Procedure: LEFT AND RIGHT HEART CATHETERIZATION WITH CORONARY ANGIOGRAM;  Surgeon: Larey Dresser, MD;  Location: [facility] CATH LAB;  Service: Cardiovascular;  Laterality: N/A;  . NECK SURGERY  03/23/09   per Dr. Lorin Mercy, cervical fusion   . right elbow surgery    . solonscopy  05/23/08   per Dr. Wynona Luna hemorrhoids only, repeat in 5 years  . TEE WITHOUT CARDIOVERSION N/A 06/23/2014   Procedure: TRANSESOPHAGEAL ECHOCARDIOGRAM (TEE);  Surgeon: Larey Dresser,  MD;  Location: [facility];  Service: Cardiovascular;  Laterality: N/A;  . TEE WITHOUT CARDIOVERSION N/A 01/21/2016   Procedure: TRANSESOPHAGEAL ECHOCARDIOGRAM (TEE);  Surgeon: Larey Dresser, MD;  Location: [facility] ENDOSCOPY;  Service: Cardiovascular;  Laterality: N/A;  . TONSILLECTOMY     Family History  Problem Relation Age of Onset  . Lung cancer Mother        lung  . Colon cancer Neg Hx   . Esophageal cancer Neg Hx   . Rectal cancer Neg Hx   . Stomach cancer Neg Hx    Social History   Tobacco Use  . Smoking status: Former Research scientist (life sciences)  . Smokeless tobacco: Never Used  . Tobacco comment: 5 yrs ago  Substance Use Topics  . Alcohol use: No    Alcohol/week: 0.0 standard drinks  . Drug use: No   Current Outpatient Medications  Medication Sig Dispense Refill  . aspirin 81 MG tablet Take 81 mg by mouth every morning.     . citalopram (CELEXA) 40 MG tablet Take 1 tablet (40 mg total) by mouth daily. 90 tablet 3  . diazepam (VALIUM) 5 MG tablet Take 1 tablet (5 mg total) by mouth every 8 (eight) hours as needed for anxiety. 60 tablet 2  . furosemide (LASIX) 40 MG tablet Take 1 tablet (40 mg total) by mouth daily. 30 tablet 11  . ibuprofen (ADVIL,MOTRIN) 200 MG tablet Take 200 mg by mouth 2 (two) times daily as needed for moderate pain.     [name] ipratropium (ATROVENT) 0.03 % nasal spray Place 2 sprays into both nostrils every 12 (twelve) hours as needed for rhinitis.    [name] losartan (COZAAR) 50 MG tablet TAKE 1 TABLET BY MOUTH EVERY DAY 90 tablet 0  . meclizine (ANTIVERT) 25 MG tablet Take 1 tablet (25 mg total) by mouth 3 (three) times daily as needed for dizziness. 30 tablet 0  . nitroGLYCERIN (NITROSTAT) 0.4 MG SL tablet Place 1 tablet (0.4 mg total) under the tongue every 5 (five) minutes as needed. For chest pain. 25 tablet 2  . potassium chloride (KLOR-CON 10) 10 MEQ tablet Take 1 tablet (10 mEq total) by mouth daily. 30 tablet 11  . rosuvastatin (CRESTOR) 20 MG tablet Take 1 tablet (20 mg  total) by mouth at bedtime. 90 tablet 3   No current facility-administered medications for this visit.    No Known Allergies   Review of Systems: All systems reviewed and negative except where noted in HPI.   Creatinine clearance cannot be calculated (Patient's most recent lab result is older than the maximum 21 days allowed.)   Physical Exam:    Wt Readings from Last 3 Encounters:  05/01/18  266 lb 12.8 oz (121 kg)  11/03/17 258 lb (117 kg)  11/10/16 242 lb 6.4 oz (110 kg)    BP 124/70   Pulse 76   Ht 5\' 6"  (1.676 m)   Wt 266 lb 12.8 oz (121 kg)   BMI 43.06 kg/m  Constitutional:  Pleasant male in no acute distress. Psychiatric: Normal mood and affect. Behavior is normal. EENT: Pupils normal.  Conjunctivae are normal. No scleral icterus. Cardiovascular: Normal rate, regular rhythm, + murmur, 2+ BLE edema Pulmonary/chest: Effort normal and breath sounds normal. No wheezing, rales or rhonchi. Abdominal: Soft, nondistended, nontender. Bowel sounds active throughout. There are no masses palpable. No hepatomegaly. RECTAL: No obvious external lesions.  Unable to perform DRE due to intense pain.  I put the tip of my finger inside the anus to try and palpate the left wall where patient was having the pain.  Again his discomfort precluded exam Neurological: Alert and oriented to person place and time.  Tye Savoy, NP  05/01/2018, 9:32 AM  Cc: Laurey Morale, MD

## 2018-05-01 NOTE — Patient Instructions (Addendum)
If you are age 65 or older, your body mass index should be between 23-30. Your Body mass index is 43.06 kg/m. If this is out of the aforementioned range listed, please consider follow up with your Primary Care Provider.  If you are age 20 or younger, your body mass index should be between 19-25. Your Body mass index is 43.06 kg/m. If this is out of the aformentioned range listed, please consider follow up with your Primary Care Provider.   We have sent the following medications to your pharmacy for you to pick up at your convenience: Diltiazem Gel  Use Recticare as needed.  (over-the-counter)   How to Take a Sitz Bath A sitz bath is a warm water bath that may be used to care for your rectum, genital area, or the area between your rectum and genitals (perineum). For a sitz bath, the water only comes up to your hips and covers your buttocks. A sitz bath may done at home in a bathtub or with a portable sitz bath that fits over the toilet. Your health care provider may recommend a sitz bath to help:  Relieve pain and discomfort after delivering a baby.  Relieve pain and itching from hemorrhoids or anal fissures.  Relieve pain after certain surgeries.  Relax muscles that are sore or tight. How to take a sitz bath Take 3-4 sitz baths a day, or as many as told by your health care provider. Bathtub sitz bath To take a sitz bath in a bathtub: 1. Partially fill a bathtub with warm water. The water should be deep enough to cover your hips and buttocks when you are sitting in the tub. 2. If your health care provider told you to put medicine in the water, follow his or her instructions. 3. Sit in the water. 4. Open the tub drain a little, and leave it open during your bath. 5. Turn on the warm water again, enough to replace the water that is draining out. Keep the water running throughout your bath. This helps keep the water at the right level and the right temperature. 6. Soak in the water for  15-20 minutes, or as long as told by your health care provider. 7. When you are done, be careful when you stand up. You may feel dizzy. 8. After the sitz bath, pat yourself dry. Do not rub your skin to dry it.  Over-the-toilet sitz bath To take a sitz bath with an over-the-toilet basin: 1. Follow the manufacturer's instructions. 2. Fill the basin with warm water. 3. If your health care provider told you to put medicine in the water, follow his or her instructions. 4. Sit on the seat. Make sure the water covers your buttocks and perineum. 5. Soak in the water for 15-20 minutes, or as long as told by your health care provider. 6. After the sitz bath, pat yourself dry. Do not rub your skin to dry it. 7. Clean and dry the basin between uses. 8. Discard the basin if it cracks, or according to the manufacturer's instructions. Contact a health care provider if:  Your symptoms get worse. Do not continue with sitz baths if your symptoms get worse.  You have new symptoms. If this happens, do not continue with sitz baths until you talk with your health care provider. Summary  A sitz bath is a warm water bath in which the water only comes up to your hips and covers your buttocks.  A sitz bath may help relieve itching, relieve  pain, and relax muscles that are sore or tight in the lower part of your body, including your genital area.  Take 3-4 sitz baths a day, or as many as told by your health care provider. Soak in the water for 15-20 minutes.  Do not continue with sitz baths if your symptoms get worse. This information is not intended to replace advice given to you by your health care provider. Make sure you discuss any questions you have with your health care provider. Document Released: 01/02/2004 Document Revised: 04/13/2017 Document Reviewed: 04/13/2017 Elsevier Interactive Patient Education  2019 [employer].  Follow up with me in eight weeks.  Please call the office in a couple of weeks  as the schedule is not available at this time.  Thank you for choosing me and [facility].   Tye Savoy, NP

## 2018-05-11 NOTE — Addendum Note (Signed)
Addended by: Jerene Bears on: 05/11/2018 03:48 PM   Modules accepted: Level of Service

## 2018-05-22 ENCOUNTER — Encounter: Payer: Self-pay | Admitting: Family Medicine

## 2018-05-22 ENCOUNTER — Ambulatory Visit: Payer: BC Managed Care – PPO | Admitting: Family Medicine

## 2018-05-22 VITALS — BP 120/70 | HR 60 | Temp 98.0°F | Wt 267.0 lb

## 2018-05-22 DIAGNOSIS — F411 Generalized anxiety disorder: Secondary | ICD-10-CM | POA: Diagnosis not present

## 2018-05-22 DIAGNOSIS — I10 Essential (primary) hypertension: Secondary | ICD-10-CM | POA: Diagnosis not present

## 2018-05-22 MED ORDER — CITALOPRAM HYDROBROMIDE 40 MG PO TABS
40.0000 mg | ORAL_TABLET | Freq: Every day | ORAL | 3 refills | Status: DC
Start: 2018-05-22 — End: 2019-02-25

## 2018-05-22 MED ORDER — DIAZEPAM 5 MG PO TABS: 5.0000 mg | ORAL_TABLET | Freq: Two times a day (BID) | ORAL | 2 refills | Status: DC | PRN

## 2018-05-22 NOTE — Progress Notes (Signed)
   Subjective:    Patient ID: Luis [name], male    DOB: 1955-06-25, 63 y.o.   MRN: [id number]  HPI Here to follow up on anxiety. He is doing well in general. He sees Cardiology frequently to watch his aortic stenosis. He is getting close to the point of needing surgery apparently.    Review of Systems  Constitutional: Negative.   Respiratory: Negative.   Cardiovascular: Negative.   Neurological: Negative.   Psychiatric/Behavioral: Negative.        Objective:   Physical Exam Constitutional:      Appearance: Normal appearance.  Cardiovascular:     Rate and Rhythm: Normal rate and regular rhythm.     Pulses: Normal pulses.     Heart sounds: Normal heart sounds.  Pulmonary:     Effort: Pulmonary effort is normal.     Breath sounds: Normal breath sounds.  Neurological:     Mental Status: He is alert.  Psychiatric:        Mood and Affect: Mood normal.        Behavior: Behavior normal.        Thought Content: Thought content normal.           Assessment & Plan:  His anxiety is well controlled. We refilled his medications. [name], MD

## 2018-05-29 ENCOUNTER — Other Ambulatory Visit (HOSPITAL_COMMUNITY): Payer: Self-pay | Admitting: Cardiology

## 2018-05-29 NOTE — Telephone Encounter (Signed)
Pt needs appt for future refills [phone] 

## 2018-05-30 ENCOUNTER — Encounter

## 2018-05-30 ENCOUNTER — Other Ambulatory Visit (HOSPITAL_COMMUNITY): Payer: Self-pay | Admitting: Cardiology

## 2018-05-30 ENCOUNTER — Ambulatory Visit: Payer: BC Managed Care – PPO | Admitting: Internal Medicine

## 2018-06-11 ENCOUNTER — Encounter: Payer: Self-pay | Admitting: *Deleted

## 2018-06-25 ENCOUNTER — Encounter: Payer: Self-pay | Admitting: Internal Medicine

## 2018-06-25 ENCOUNTER — Ambulatory Visit: Payer: PPO | Admitting: Internal Medicine

## 2018-06-25 VITALS — BP 128/76 | HR 57 | Ht 66.0 in | Wt 269.1 lb

## 2018-06-25 DIAGNOSIS — K625 Hemorrhage of anus and rectum: Secondary | ICD-10-CM

## 2018-06-25 DIAGNOSIS — K602 Anal fissure, unspecified: Secondary | ICD-10-CM | POA: Diagnosis not present

## 2018-06-25 MED ORDER — DILTIAZEM GEL 2 %: 1.0000 "application " | Freq: Two times a day (BID) | CUTANEOUS | 0 refills | Status: DC

## 2018-06-25 NOTE — Progress Notes (Signed)
   Subjective:    Patient ID: Luis [name], male    DOB: 1955-11-30, 64 y.o.   MRN: [id number]  HPI Luis [name] is a 63 year old male with a past medical history of AAA, hypertension, chronic diastolic heart failure, CAD, carotid stenosis, sleep apnea, adenomatous colon polyps and recent diagnosis of anal fissure who is here for follow-up.  He was seen on 05/01/2018 Luis Savoy, NP.  At that visit he was treated with diltiazem gel and soaking baths.  On exam that day complete DRE was unable to be performed due to intense pain and there was a presumed fissure in the left lateral position.  He reports that he has been using the diltiazem gel but is having a difficult time applying this to the first knuckle due to discomfort.  He still has pain with defecation as the stool is being passed.  He also has intermittent red blood with wiping and occasionally on the stool.  He did have 1 week of absolutely no symptoms and then symptoms recurred.  He has had issues with this on and off for years.  After bowel movement it can hurt to sit and walk.  He actually tries to avoid going to the bathroom at times due to the discomfort.  No abdominal pain.  No nausea or vomiting.  No fevers or chills.  No upper GI or hepatobiliary complaint.   Review of Systems As per HPI, otherwise negative  Current Medications, Allergies, Past Medical History, Past Surgical History, Family History and Social History were reviewed in [employer] record.     Objective:   Physical Exam BP 128/76   Pulse (!) 57   Ht 5\' 6"  (1.676 m)   Wt 269 lb 2 oz (122.1 kg)   BMI 43.44 kg/m  Gen: awake, alert, NAD HEENT: anicteric, op clear CV: RRR, no mrg Pulm: CTA b/l Abd: soft, NT/ND, +BS throughout Ext: no c/c/e Neuro: nonfocal     Assessment & Plan:  63 year old male with a past medical history of AAA, hypertension, chronic diastolic heart failure, CAD, carotid stenosis, sleep apnea, adenomatous  colon polyps and recent diagnosis of anal fissure who is here for follow-  1. Anal fissure --we spent time today again discussing anal fissure and how this can take weeks to heal.  I do not think he is getting the diltiazem into the anal canal for maximal efficacy.  This is likely due to discomfort.  I recommended that he begin RectiCare before bowel movement and after bowel movement as needed.  We discussed that he can apply this, pea-sized amount, with the tip of a Q-tip inserted no more than 1 inch into the anal canal.  I would like him to apply diltiazem gel 2% 2-3 times a day in the same manner for at least a month.  Continue high-fiber diet so that stools are formed, not too loose or too hard. --I instructed him to call me on 07/25/2018.  If symptoms have healed nothing further, if symptoms persist proceed to colonoscopy which would allow for sedation for a complete exam.  Would also suffice for colon polyp surveillance which is due next year.  25 minutes spent with the patient today. Greater than 50% was spent in counseling and coordination of care with the patient

## 2018-06-25 NOTE — Patient Instructions (Signed)
We have sent a prescription refill for diltiazem gel to [facility]. You should apply a pea size amount inside your rectum twice daily.  [facility]'s information is below: Address: [address], [place], [place] [address]  [phone]  *Please DO NOT go directly from our office to pick up this medication! Give the pharmacy 1 day to process the prescription as this is compounded at takes time to make.  Please purchase the following medications over the counter and take as directed: Recticare-Apply as needed inside rectum.   Please call our office on 07/25/2018 with an update on your condition. If you are not better, we may need to consider colonoscopy.  If you are age 65 or older, your body mass index should be between 23-30. Your Body mass index is 43.44 kg/m. If this is out of the aforementioned range listed, please consider follow up with your Primary Care Provider.  If you are age 11 or younger, your body mass index should be between 19-25. Your Body mass index is 43.44 kg/m. If this is out of the aformentioned range listed, please consider follow up with your Primary Care Provider.

## 2018-07-25 ENCOUNTER — Telehealth: Payer: Self-pay | Admitting: Internal Medicine

## 2018-07-25 ENCOUNTER — Ambulatory Visit: Payer: Self-pay | Admitting: *Deleted

## 2018-07-25 ENCOUNTER — Other Ambulatory Visit: Payer: Self-pay

## 2018-07-25 ENCOUNTER — Encounter: Payer: Self-pay | Admitting: Family Medicine

## 2018-07-25 ENCOUNTER — Ambulatory Visit (INDEPENDENT_AMBULATORY_CARE_PROVIDER_SITE_OTHER): Payer: PPO | Admitting: Family Medicine

## 2018-07-25 DIAGNOSIS — S30861A Insect bite (nonvenomous) of abdominal wall, initial encounter: Secondary | ICD-10-CM

## 2018-07-25 DIAGNOSIS — W57XXXA Bitten or stung by nonvenomous insect and other nonvenomous arthropods, initial encounter: Secondary | ICD-10-CM

## 2018-07-25 MED ORDER — DOXYCYCLINE HYCLATE 100 MG PO CAPS: 100.0000 mg | ORAL_CAPSULE | Freq: Two times a day (BID) | ORAL | 0 refills | Status: AC

## 2018-07-25 NOTE — Telephone Encounter (Signed)
Message from Valla Leaver sent at 07/25/2018 8:34 AM EDT   Summary: tick, bite, rash   Pulled a tick off Monday night, rash, icthing          Returned call to patient regarding tick bite. Stated the tick was small and black and embedded into his skin. Located under his belly button. The area is red, itchy and blotchy looking. He denies fever or headache. He found the tick on Sunday night but stated had been itching 3 or 4 days before that. No tetanus booster noted in chart and pt can not remember when he received the last one. Advised of possible virtual visit, pt is willing. His # is [phone] E-mail address  [email or web address] He will not be available today until after 1 for virtual visit. Notified flow at [facility]. And pt will receive a call back. Advised to call back for increase in any symptoms, voiced understanding.  Reason for Disposition . [1] Red or very tender (to touch) area AND [2] started over 24 hours after the bite  Answer Assessment - Initial Assessment Questions 1. TYPE of TICK: "Is it a wood tick or a deer tick?" If unsure, ask: "What size was the tick?" "Did it look more like a watermelon seed or a poppy seed?"      Small black tick 2. LOCATION: "Where is the tick bite located?"      Under belly, under the belly bottom 3. ONSET: "How long do you think the tick was attached before you removed it?" (Hours or days)      A few days 4. TETANUS: "When was the last tetanus booster?"      Not sure 5. PREGNANCY: "Is there any chance you are pregnant?" "When was your last menstrual period?"     n/a  Protocols used: TICK BITE-A-AH

## 2018-07-25 NOTE — Progress Notes (Signed)
   Subjective:    Patient ID: Luis [name], male    DOB: [birth year], 63 y.o.   MRN: [id number]  HPI Virtual Visit via Telephone Note  I connected with the patient on 07/25/18 at  2:00 PM EDT by Webex and verified that I am speaking with the correct person using two identifiers. We had technical difficulty with both audio and video, so we conducted the visit by telephone.    I discussed the limitations, risks, security and privacy concerns of performing an evaluation and management service by telephone and the availability of in person appointments. I also discussed with the patient that there may be a patient responsible charge related to this service. The patient expressed understanding and agreed to proceed.  Location patient: home Location provider: work or home office Participants present for the call: patient, provider Patient did not have a visit in the prior 7 days to address this/these issue(s).   History of Present Illness: He noticed a small tick embedded in the skin on his lower abdomen above the pubis 2 days ago. His wife pulled it out with tweezers. Then yesterday hr developed a red itchy rash in this area. He has been applying cortisone cream to it. He feels fine in general, and he denies any fever, body ache, or headache.    Observations/Objective: Patient sounds cheerful and well on the phone. I do not appreciate any SOB. Speech and thought processing are grossly intact. Patient reported vitals:  Assessment and Plan: This is a local reaction to the tick bite. We will cover with 10 days of Doxycycline. Recheck prn.  [name], MD   Follow Up Instructions:     [phone] 11-20 [phone] 21-30 I did not refer this patient for an OV in the next 24 hours for this/these issue(s).  I discussed the assessment and treatment plan with the patient. The patient was provided an opportunity to ask questions and all were answered. The patient agreed with the plan and  demonstrated an understanding of the instructions.   The patient was advised to call back or seek an in-person evaluation if the symptoms worsen or if the condition fails to improve as anticipated.  I provided  13 minutes of non-face-to-face time during this encounter.   [name], MD    Review of Systems     Objective:   Physical Exam        Assessment & Plan:

## 2018-07-25 NOTE — Telephone Encounter (Signed)
Great to hear Would continue the anal fissure treatment for 1-2 weeks until completely healed

## 2018-07-25 NOTE — Telephone Encounter (Signed)
Pt's wife aware.

## 2018-07-25 NOTE — Telephone Encounter (Signed)
Pts wife states he has no bleeding and no pain for about a week. She called to give an update.

## 2018-07-25 NOTE — Telephone Encounter (Signed)
Called and spoke with pt and he is aware of the webex appt scheduled today with Dr. Sarajane Jews at 2 pm

## 2018-07-25 NOTE — Telephone Encounter (Signed)
Pt's wife called to update Dr. Hilarie Fredrickson on pt's progress, she stated that he has not experienced bleeding for a week now and pain has also subsided.

## 2018-08-21 ENCOUNTER — Other Ambulatory Visit (HOSPITAL_COMMUNITY): Payer: Self-pay | Admitting: Cardiology

## 2018-09-02 ENCOUNTER — Other Ambulatory Visit (HOSPITAL_COMMUNITY): Payer: Self-pay | Admitting: Cardiology

## 2018-09-10 ENCOUNTER — Telehealth: Payer: Self-pay | Admitting: Internal Medicine

## 2018-09-10 NOTE — Telephone Encounter (Signed)
Pt is experiencing rectal bleeding and abd p.  Pt would like a call to discuss.

## 2018-09-11 NOTE — Telephone Encounter (Signed)
Persistent anal fissure type symptom despite more than adequate treatment with diltiazem gel  I would recommend the following Continue RectiCare for pain per box instruction, this can be applied to the area of pain before bowel movement and as needed I would have him continue diltiazem gel twice daily applied to the first knuckle to the anal canal Please arrange colonoscopy, which I had previously discussed with him --this will allow for more complete evaluation/examination also exclude other pathology  I would have him call his primary care regarding dysuria and hematuria

## 2018-09-11 NOTE — Telephone Encounter (Signed)
See telephone note dated today.

## 2018-09-11 NOTE — Telephone Encounter (Signed)
Spoke with patient who reports intermittent rectal bleeding. The patient reports he is applying the diltazem gel and the gel is "not helping." The patient reports pain and burning upon defecation, frank red blood "on the tissue" and occasionally in the commode. The patient reports most stools are soft, brown, formed stool with some stools that are hard and uncomfortable to pass. Patient denies weakness, dizziness, lightheadedness, but does complain of SOB that he feels is unrelated to his GI symptoms. The patient does take a daily aspirin 81 mg. The patient also mentioned dysuria and reports that he passed what he called a "blood clot" but wife thinks was a "stone." Dysuria has continued and the patient wondered if all of these symptoms were related. This RN expressed to the patient that his dysuria and rectal bleeding were mostly like not related but would pass along this information to the provider. Please advise.

## 2018-09-12 NOTE — Telephone Encounter (Signed)
Scheduled prop colon in the [place]: Thursday 10/04/18 at 1:30 pm TELEPHONE previsit with nurse: Thursday 09/20/18 at 11:00 am  Left message for patient to call back.

## 2018-09-13 NOTE — Telephone Encounter (Signed)
Letter sent to patient for appt reminders.

## 2018-09-13 NOTE — Telephone Encounter (Signed)
Spoke to the patient and reports Dr. Vena Rua recommendations concerning continued treatment for anal fissures and gave the patient dates and times concerning upcoming appointments. Patient told to call back with additional questions or concerns.

## 2018-09-14 NOTE — Telephone Encounter (Signed)
Josh reviewed echo report and states that it is ok for [place].   Riki Sheer, LPN ( [facility] )

## 2018-09-14 NOTE — Telephone Encounter (Signed)
Dr. Hilarie Fredrickson and [name],   Please review Echo for Luis [name]. MRN: [id number]. Patient has moderate Aortic Stenosis. Is this patient [name] for [name]?

## 2018-09-15 ENCOUNTER — Other Ambulatory Visit: Payer: Self-pay | Admitting: Internal Medicine

## 2018-09-20 ENCOUNTER — Other Ambulatory Visit: Payer: Self-pay

## 2018-09-20 ENCOUNTER — Ambulatory Visit (AMBULATORY_SURGERY_CENTER): Payer: Self-pay

## 2018-09-20 ENCOUNTER — Encounter: Payer: Self-pay | Admitting: Internal Medicine

## 2018-09-20 VITALS — Ht 66.0 in | Wt 269.0 lb

## 2018-09-20 DIAGNOSIS — K625 Hemorrhage of anus and rectum: Secondary | ICD-10-CM

## 2018-09-20 MED ORDER — PEG 3350-KCL-NA BICARB-NACL 420 G PO SOLR: 4000.0000 mL | Freq: Once | ORAL | 0 refills | Status: AC

## 2018-09-20 NOTE — Progress Notes (Signed)
Denies allergies to eggs or soy products. Denies complication of anesthesia or sedation. Denies use of weight loss medication. Denies use of O2.   Emmi instructions given for colonoscopy.   Pre-Visit was conducted by phone due to Covid 19. Instructions were reviewed and mailed to patients confirmed home address. Patient was encouraged to call with any questions or concerns.

## 2018-10-03 ENCOUNTER — Telehealth: Payer: Self-pay | Admitting: *Deleted

## 2018-10-03 NOTE — Telephone Encounter (Signed)

## 2018-10-04 ENCOUNTER — Encounter: Payer: Self-pay | Admitting: Internal Medicine

## 2018-10-04 ENCOUNTER — Other Ambulatory Visit: Payer: Self-pay

## 2018-10-04 ENCOUNTER — Ambulatory Visit (AMBULATORY_SURGERY_CENTER): Payer: PPO | Admitting: Internal Medicine

## 2018-10-04 ENCOUNTER — Other Ambulatory Visit (HOSPITAL_COMMUNITY): Payer: Self-pay | Admitting: Cardiology

## 2018-10-04 VITALS — BP 121/77 | HR 46 | Temp 97.8°F | Resp 21 | Ht 66.0 in | Wt 269.0 lb

## 2018-10-04 DIAGNOSIS — K625 Hemorrhage of anus and rectum: Secondary | ICD-10-CM

## 2018-10-04 DIAGNOSIS — D122 Benign neoplasm of ascending colon: Secondary | ICD-10-CM

## 2018-10-04 DIAGNOSIS — D123 Benign neoplasm of transverse colon: Secondary | ICD-10-CM | POA: Diagnosis not present

## 2018-10-04 MED ORDER — CLOTRIMAZOLE-BETAMETHASONE 1-0.05 % EX CREA: 1.0000 "application " | TOPICAL_CREAM | Freq: Two times a day (BID) | CUTANEOUS | 0 refills | Status: DC

## 2018-10-04 MED ORDER — SODIUM CHLORIDE 0.9 % IV SOLN: 500.0000 mL | Freq: Once | INTRAVENOUS | Status: DC

## 2018-10-04 NOTE — Op Note (Signed)
[place] Patient Name: Luis [name] Procedure Date: 10/04/2018 1:33 PM MRN: [id number] Endoscopist: Jerene Bears , MD Age: 63 Referring MD:  Date of Birth: 1956-02-19 Gender: Male Account #: [id number] Procedure:                Colonoscopy Indications:              Rectal bleeding, anorectal pain, personal history                            of tubular adenoma (< 1 cm) in Oct 2016 Medicines:                Monitored Anesthesia Care Procedure:                Pre-Anesthesia Assessment:                           - Prior to the procedure, a History and Physical                            was performed, and patient medications and                            allergies were reviewed. The patient's tolerance of                            previous anesthesia was also reviewed. The risks                            and benefits of the procedure and the sedation                            options and risks were discussed with the patient.                            All questions were answered, and informed consent                            was obtained. Prior Anticoagulants: The patient has                            taken no previous anticoagulant or antiplatelet                            agents. ASA Grade Assessment: III - A patient with                            severe systemic disease. After reviewing the risks                            and benefits, the patient was deemed in                            satisfactory condition to undergo the procedure.  After obtaining informed consent, the colonoscope                            was passed under direct vision. Throughout the                            procedure, the patient's blood pressure, pulse, and                            oxygen saturations were monitored continuously. The                            Colonoscope was introduced through the anus and                            advanced to the cecum,  identified by appendiceal                            orifice and ileocecal valve. The colonoscopy was                            performed without difficulty. The patient tolerated                            the procedure well. The quality of the bowel                            preparation was good. The ileocecal valve,                            appendiceal orifice, and rectum were photographed. Scope In: 1:43:49 PM Scope Out: 1:59:22 PM Scope Withdrawal Time: 0 hours 13 minutes 8 seconds  Total Procedure Duration: 0 hours 15 minutes 33 seconds  Findings:                 The perianal exam findings include a perianal                            fungal rash. No anal fissure was seen today.                           A 6 mm polyp was found in the ascending colon. The                            polyp was sessile. The polyp was removed with a                            cold snare. Resection and retrieval were complete.                           A 5 mm polyp was found in the transverse colon. The  polyp was sessile. The polyp was removed with a                            cold snare. Resection and retrieval were complete.                           Internal hemorrhoids were found during                            retroflexion. The hemorrhoids were small. Complications:            No immediate complications. Estimated Blood Loss:     Estimated blood loss was minimal. Impression:               - Perianal fungal rash found on perianal exam.                           - One 6 mm polyp in the ascending colon, removed                            with a cold snare. Resected and retrieved.                           - One 5 mm polyp in the transverse colon, removed                            with a cold snare. Resected and retrieved.                           - Internal hemorrhoids. Recommendation:           - Patient has a contact number available for                             emergencies. The signs and symptoms of potential                            delayed complications were discussed with the                            patient. Return to normal activities tomorrow.                            Written discharge instructions were provided to the                            patient.                           - Resume previous diet.                           - Continue present medications.                           - Lotrisone cream twice daily for 1-2  weeks for                            perianal rash and itching.                           - Await pathology results.                           - Repeat colonoscopy is recommended for                            surveillance. The colonoscopy date will be                            determined after pathology results from today's                            exam become available for review.                           - Call Dr. Aundra Dubin at [facility] for a follow-up                            office visit (due at this time).                           - Referral to [facility] given recent                            hematuria. Jerene Bears, MD 10/04/2018 2:09:07 PM This report has been signed electronically.

## 2018-10-04 NOTE — Patient Instructions (Addendum)
Handouts Provided:  Polyps    YOU HAD AN ENDOSCOPIC PROCEDURE TODAY AT [facility]:   Refer to the procedure report that was given to you for any specific questions about what was found during the examination.  If the procedure report does not answer your questions, please call your gastroenterologist to clarify.  If you requested that your care partner not be given the details of your procedure findings, then the procedure report has been included in a sealed envelope for you to review at your convenience later.  YOU SHOULD EXPECT: Some feelings of bloating in the abdomen. Passage of more gas than usual.  Walking can help get rid of the air that was put into your GI tract during the procedure and reduce the bloating. If you had a lower endoscopy (such as a colonoscopy or flexible sigmoidoscopy) you may notice spotting of blood in your stool or on the toilet paper. If you underwent a bowel prep for your procedure, you may not have a normal bowel movement for a few days.  Please Note:  You might notice some irritation and congestion in your nose or some drainage.  This is from the oxygen used during your procedure.  There is no need for concern and it should clear up in a day or so.  SYMPTOMS TO REPORT IMMEDIATELY:   Following lower endoscopy (colonoscopy or flexible sigmoidoscopy):  Excessive amounts of blood in the stool  Significant tenderness or worsening of abdominal pains  Swelling of the abdomen that is new, acute  Fever of 100F or higher  For urgent or emergent issues, a gastroenterologist can be reached at any hour by calling [phone].   DIET:  We do recommend a small meal at first, but then you may proceed to your regular diet.  Drink plenty of fluids but you should avoid alcoholic beverages for 24 hours.  ACTIVITY:  You should plan to take it easy for the rest of today and you should NOT DRIVE or use heavy machinery until tomorrow (because of the sedation  medicines used during the test).    FOLLOW UP: Our staff will call the number listed on your records 48-72 hours following your procedure to check on you and address any questions or concerns that you may have regarding the information given to you following your procedure. If we do not reach you, we will leave a message.  We will attempt to reach you two times.  During this call, we will ask if you have developed any symptoms of COVID 19. If you develop any symptoms (ie: fever, flu-like symptoms, shortness of breath, cough etc.) before then, please call [phone].  If you test positive for Covid 19 in the 2 weeks post procedure, please call and report this information to [place].    If any biopsies were taken you will be contacted by phone or by letter within the next 1-3 weeks.  Please call us at [phone] if you have not heard about the biopsies in 3 weeks.    SIGNATURES/CONFIDENTIALITY: You and/or your care partner have signed paperwork which will be entered into your electronic medical record.  These signatures attest to the fact that that the information above on your After Visit Summary has been reviewed and is understood.  Full responsibility of the confidentiality of this discharge information lies with you and/or your care-partner.

## 2018-10-04 NOTE — Progress Notes (Signed)
Pt's states no medical or surgical changes since previsit or office visit.  Temps taken by CW V/S taken by JB

## 2018-10-04 NOTE — Progress Notes (Signed)
PT taken to PACU. Monitors in place. VSS. Report given to RN. 

## 2018-10-08 ENCOUNTER — Telehealth: Payer: Self-pay | Admitting: *Deleted

## 2018-10-08 NOTE — Telephone Encounter (Signed)
  Follow up Call-  Call back number 10/04/2018  Post procedure Call Back phone  # [phone]  Permission to leave phone message Yes  Some recent data might be hidden     Patient questions:  Do you have a fever, pain , or abdominal swelling? No. Pain Score  0 *  Have you tolerated food without any problems? Yes.    Have you been able to return to your normal activities? Yes.    Do you have any questions about your discharge instructions: Diet   No. Medications  No. Follow up visit  No.  Do you have questions or concerns about your Care? No.  Actions: * If pain score is 4 or above: No action needed, pain <4.  1. Have you developed a fever since your procedure? NO  2.   Have you had an respiratory symptoms (SOB or cough) since your procedure? NO  3.   Have you tested positive for COVID 19 since your procedure NO  4.   Have you had any family members/close contacts diagnosed with the COVID 19 since your procedure?  NO   If yes to any of these questions please route to Joylene John, RN and Alphonsa Gin, RN.

## 2018-10-09 ENCOUNTER — Encounter: Payer: Self-pay | Admitting: Internal Medicine

## 2018-10-09 ENCOUNTER — Telehealth: Payer: Self-pay

## 2018-10-09 NOTE — Telephone Encounter (Signed)
Referral made to [facility] for Hematuria. Patient called and notified

## 2018-10-27 ENCOUNTER — Other Ambulatory Visit (HOSPITAL_COMMUNITY): Payer: Self-pay | Admitting: Cardiology

## 2018-11-13 ENCOUNTER — Other Ambulatory Visit (HOSPITAL_COMMUNITY): Payer: Self-pay

## 2018-11-13 MED ORDER — LOSARTAN POTASSIUM 50 MG PO TABS: 50.0000 mg | ORAL_TABLET | Freq: Every day | ORAL | 0 refills | Status: DC

## 2018-11-22 ENCOUNTER — Other Ambulatory Visit: Payer: Self-pay

## 2018-11-22 DIAGNOSIS — R6889 Other general symptoms and signs: Secondary | ICD-10-CM | POA: Diagnosis not present

## 2018-11-22 DIAGNOSIS — Z20822 Contact with and (suspected) exposure to covid-19: Secondary | ICD-10-CM

## 2018-11-24 LAB — NOVEL CORONAVIRUS, NAA: SARS-CoV-2, NAA: NOT DETECTED

## 2018-11-27 ENCOUNTER — Other Ambulatory Visit (HOSPITAL_COMMUNITY): Payer: Self-pay | Admitting: Cardiology

## 2018-12-03 ENCOUNTER — Other Ambulatory Visit: Payer: Self-pay

## 2018-12-03 ENCOUNTER — Encounter (HOSPITAL_COMMUNITY): Payer: Self-pay | Admitting: Cardiology

## 2018-12-03 ENCOUNTER — Ambulatory Visit (HOSPITAL_COMMUNITY)
Admission: RE | Admit: 2018-12-03 | Discharge: 2018-12-03 | Disposition: A | Discharge status: Home / Self Care | Payer: PPO | Source: Ambulatory Visit | Attending: Cardiology | Admitting: Cardiology

## 2018-12-03 VITALS — BP 131/68 | HR 68 | Wt 268.6 lb

## 2018-12-03 DIAGNOSIS — I2510 Atherosclerotic heart disease of native coronary artery without angina pectoris: Secondary | ICD-10-CM | POA: Insufficient documentation

## 2018-12-03 DIAGNOSIS — Q231 Congenital insufficiency of aortic valve: Secondary | ICD-10-CM | POA: Diagnosis not present

## 2018-12-03 DIAGNOSIS — I5032 Chronic diastolic (congestive) heart failure: Secondary | ICD-10-CM | POA: Insufficient documentation

## 2018-12-03 DIAGNOSIS — I712 Thoracic aortic aneurysm, without rupture: Secondary | ICD-10-CM | POA: Diagnosis not present

## 2018-12-03 DIAGNOSIS — Z86010 Personal history of colonic polyps: Secondary | ICD-10-CM | POA: Diagnosis not present

## 2018-12-03 DIAGNOSIS — G4733 Obstructive sleep apnea (adult) (pediatric): Secondary | ICD-10-CM | POA: Insufficient documentation

## 2018-12-03 DIAGNOSIS — I7121 Aneurysm of the ascending aorta, without rupture: Secondary | ICD-10-CM

## 2018-12-03 DIAGNOSIS — E785 Hyperlipidemia, unspecified: Secondary | ICD-10-CM | POA: Insufficient documentation

## 2018-12-03 DIAGNOSIS — Z7982 Long term (current) use of aspirin: Secondary | ICD-10-CM | POA: Insufficient documentation

## 2018-12-03 DIAGNOSIS — Z955 Presence of coronary angioplasty implant and graft: Secondary | ICD-10-CM | POA: Insufficient documentation

## 2018-12-03 DIAGNOSIS — Z79899 Other long term (current) drug therapy: Secondary | ICD-10-CM | POA: Diagnosis not present

## 2018-12-03 DIAGNOSIS — Z87891 Personal history of nicotine dependence: Secondary | ICD-10-CM | POA: Diagnosis not present

## 2018-12-03 DIAGNOSIS — Z8249 Family history of ischemic heart disease and other diseases of the circulatory system: Secondary | ICD-10-CM | POA: Insufficient documentation

## 2018-12-03 DIAGNOSIS — I110 Hypertensive heart disease with heart failure: Secondary | ICD-10-CM | POA: Insufficient documentation

## 2018-12-03 DIAGNOSIS — I350 Nonrheumatic aortic (valve) stenosis: Secondary | ICD-10-CM | POA: Diagnosis not present

## 2018-12-03 LAB — BASIC METABOLIC PANEL
Anion gap: 10 (ref 5–15)
BUN: 16 mg/dL (ref 8–23)
CO2: 27 mmol/L (ref 22–32)
Calcium: 9.1 mg/dL (ref 8.9–10.3)
Chloride: 101 mmol/L (ref 98–111)
Creatinine, Ser: 1.09 mg/dL (ref 0.61–1.24)
GFR calc Af Amer: >60 mL/min (ref >60)
GFR calc non Af Amer: >60 mL/min (ref >60)
Glucose, Bld: 115 mg/dL — ABNORMAL HIGH (ref 70–99)
Potassium: 4.9 mmol/L (ref 3.5–5.1)
Sodium: 138 mmol/L (ref 135–145)

## 2018-12-03 LAB — CBC
HCT: 41.8 % (ref 39.0–52.0)
Hemoglobin: 13.6 g/dL (ref 13.0–17.0)
MCH: 29.1 pg (ref 26.0–34.0)
MCHC: 32.5 g/dL (ref 30.0–36.0)
MCV: 89.3 fL (ref 80.0–100.0)
Platelets: 165 10*3/uL (ref 150–400)
RBC: 4.68 MIL/uL (ref 4.22–5.81)
RDW: 13.7 % (ref 11.5–15.5)
WBC: 7 10*3/uL (ref 4.0–10.5)
nRBC: 0 % (ref 0.0–0.2)

## 2018-12-03 LAB — LIPID PANEL
Cholesterol: 154 mg/dL (ref 0–200)
HDL: 50 mg/dL (ref >40)
LDL Cholesterol: 59 mg/dL (ref 0–99)
Total CHOL/HDL Ratio: 3.1 RATIO
Triglycerides: 224 mg/dL — ABNORMAL HIGH (ref <150)
VLDL: 45 mg/dL — ABNORMAL HIGH (ref 0–40)

## 2018-12-03 NOTE — Patient Instructions (Signed)
Labs done today  Your physician has requested that you have an echocardiogram. Echocardiography is a painless test that uses sound waves to create images of your heart. It provides your doctor with information about the size and shape of your heart and how well your heart's chambers and valves are working. This procedure takes approximately one hour. There are no restrictions for this procedure.  Non-Cardiac CT Angiography (CTA), is a special type of CT scan that uses a computer to produce multi-dimensional views of major blood vessels throughout the body. In CT angiography, a contrast material is injected through an IV to help visualize the blood vessels.  Once approved by your insurance company we will call you to schedule this.  We will contact you in 6 months to schedule your next appointment.  At the [facility], you and your health needs are our priority. As part of our continuing mission to provide you with exceptional heart care, we have created designated Provider Care Teams. These Care Teams include your primary Cardiologist (physician) and Advanced Practice Providers (APPs- Physician Assistants and Nurse Practitioners) who all work together to provide you with the care you need, when you need it.   You may see any of the following providers on your designated Care Team at your next follow up: [name] Dr Glori Bickers . Dr [name] . Darrick Grinder, NP   Please be sure to bring in all your medications bottles to every appointment.

## 2018-12-03 NOTE — Progress Notes (Signed)
Patient ID: Luis [name], male   DOB: 1955-08-27, 63 y.o.   MRN: [id number] PCP: Dr. Sarajane Jews Cardiology: Dr. Aundra Dubin  63 y.o. with history of CAD and moderate aortic stenosis presents for follow of AS and CAD. He had RCA PCI in 8/12.     When I saw him in early 2016, he reported significant exertional dyspnea.  I had him do a Lexiscan Cardiolite in 2/16.  This showed no ischemia or infarction.  I also had him get an echo that showed ?bicuspid aortic valve and probably moderate aortic stenosis.  Given ongoing symptoms, I had him do a RHC/LHC and TEE in 2/16.  The RHC showed mildly elevated left and right heart filling pressures and the LHC showed nonobstructive CAD.  TEE confirmed bicuspid aortic valve with moderate AS.  He had PFTs in 4/16 that were within normal limits.  I started him on Lasix 20 mg daily.  He seemed to improve.    Progressive dypnea in fall 2017.  RHC/LHC in 2017 showed mildly elevated left heart filling pressure and nonobstructive CAD.  TEE showed moderate aortic stenosis, bicuspid valve.  Stable ascending aorta dilation.   Echo in 9/18 showed EF 60-65% with bicuspid aortic valve and moderate AS.  CTA chest showed 4.5 cm ascending aorta.  Repeat CTA chest in 8/19 showed stable 4.5 cm ascending aorta.   He has been stable symptomatically.  No chest pain.  No dyspnea walking on flat ground though he gets short of breath walking up a hill or other moderate activity.  Tired after walking about 1/2 block. He is able to get around in stores without problems.  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 Weight is up 10 lbs since I last saw him.     Labs (2/15): K 4.4, creatinine 0.9, LDL 71, HDL 53 Labs (2/16): K 4.8, creatinine 1.0, LDL 62, HDL 57 Labs (8/16): K 4.7, creatinine 0.95, TSH normal, HCT 41.2, LDL 78, HDL 58 Labs (8/17): K 4.3, creatinine 1.05, HCT 40.1, TSH normal, BNP 56 Labs (9/17): creatinine 1.08, BNP 56 Labs (7/18): creatinine 1.2, LDL 53 Labs  (7/19): LDL 68 Labs (8/19): K 5, creatinine 1.09  ECG (personally reviewed): NSR at 56 bpm  PMH: 1. OSA: Using CPAP.  2. CAD: Cardiolite 8/12 with inferior infarct and peri-infarct ischemia, had PCI to the mid and distal RCA with Promus DES x 2.  Lexiscan Cardiolite (2/16) with no ischemia or infarction. LHC/RHC (2/16) with mean RA 12, PA 32/15, mean PCWP 18, CI 3.47; patent mid and distal RCA stents, 50-60% proximal stenosis small PDA.   - LHC/RHC (9/17): 60% proximal PDA; mean RA 2, PA 24/8, mean PCWP 18, CI 2.13.  - Cardiolite (8/17): EF 56%, normal.  3. Carotid stenosis: 2/15 carotid dopplers 1-39% bilateral ICA stenosis.  4. Colon polyps 5. HTN 6. Hyperlipidemia 7. Abdominal US (10/12) with no AAA 8. Aortic stenosis: Echo (2/15) with EF 60-65%, mild LVH, moderate diastolic dysfunction, normal RV size/systolic function, moderate aortic stenosis with mean gradient 31 and AVA 1.16 cm2, ascending aorta 4.4 cm.  Echo (2/16) with EF 60-65%, grade II diastolic dysfunction, ?bicuspid aortic valve with moderate AS (mean gradient 35 mmHg, AVA 1.5 cm^2), mildly dilated RV with normal systolic function.  TEE (2/16) with bicuspid aortic valve, moderate AS mean gradient 30 mmHg, AVA > 1 cm^2, EF 55-60%, mild LVH, mildly dilated RV with normal systolic function, mild AI, mild MR, ascending aorta 4.2 cm.  -  Echo (8/17): EF 60-65%, grade II diastolic dysfunction, bicuspid aortic valve with mean gradient 31 mmHg, probably moderate stenosis.  - TEE (9/17): EF 55-60%, bicuspid aortic valve with moderate AS with mean gradient 24 mmHg and AVA 1.05 cm^2, ascending aorta 4.2 cm.  - Echo (9/18): EF 60-65%, bicuspid aortic valve, AVA 1.05 cm^2 with mean gradient 24 mmHg (moderate AS).  9. Ascending aorta aneurysm: 4.4 cm on 2/15 echo. Ascending aorta 4.2 cm TEE 2/16.  Ascending aorta 4.2 cm on 9/17 TEE.  - CTA chest (9/18): 4.5 cm ascending aorta.  - CTA chest (8/19): 4.5 cm ascending aorta 10. Chronic diastolic  CHF.  11. PFTs (4/16) were within normal limits.  12. Lower extremity arterial dopplers normal in 8/17.   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  Sister with RyR2 gene.   ROS: All systems reviewed and negative except as per HPI.   Current Outpatient Medications  Medication Sig Dispense Refill  . aspirin 81 MG tablet Take 81 mg by mouth every morning.     . citalopram (CELEXA) 40 MG tablet Take 1 tablet (40 mg total) by mouth daily. 90 tablet 3  . clotrimazole-betamethasone (LOTRISONE) cream Apply 1 application topically 2 (two) times daily. For 1-2 weeks 30 g 0  . diazepam (VALIUM) 5 MG tablet Take 1 tablet (5 mg total) by mouth every 12 (twelve) hours as needed for anxiety. 60 tablet 2  . diltiazem 2 % GEL Apply 1 application topically 2 (two) times daily. 30 g 0  . furosemide (LASIX) 40 MG tablet TAKE 1 TABLET BY MOUTH EVERY DAY 90 tablet 3  . ibuprofen (ADVIL,MOTRIN) 200 MG tablet Take 200 mg by mouth 2 (two) times daily as needed for moderate pain.     [name] ipratropium (ATROVENT) 0.03 % nasal spray Place 2 sprays into both nostrils every 12 (twelve) hours as needed for rhinitis.    [name] losartan (COZAAR) 50 MG tablet Take 1 tablet (50 mg total) by mouth daily. 90 tablet 0  . meclizine (ANTIVERT) 25 MG tablet Take 1 tablet (25 mg total) by mouth 3 (three) times daily as needed for dizziness. 30 tablet 0  . nitroGLYCERIN (NITROSTAT) 0.4 MG SL tablet Place 1 tablet (0.4 mg total) under the tongue every 5 (five) minutes as needed. For chest pain. 25 tablet 2  . potassium chloride (KLOR-CON 10) 10 MEQ tablet Take 1 tablet (10 mEq total) by mouth daily. 30 tablet 11  . rosuvastatin (CRESTOR) 20 MG tablet TAKE 1 TABLET BY MOUTH EVERYDAY AT BEDTIME 30 tablet 1   No current facility-administered medications for this encounter.    BP 131/68   Pulse 68   Wt 121.8 kg (268 lb 9.6 oz)   SpO2 97%   BMI 43.35 kg/m  General: NAD Neck: No JVD, no thyromegaly or  thyroid nodule.  Lungs: Clear to auscultation bilaterally with normal respiratory effort. CV: Nondisplaced PMI.  Heart regular S1/S2, no X3/K4, 3/6 systolic murmur RUSB with clear S2, murmur radiates to the neck.  1+ ankle edema.  Normal pedal pulses.  Abdomen: Soft, nontender, no hepatosplenomegaly, no distention.  Skin: Intact without lesions or rashes.  Neurologic: Alert and oriented x 3.  Psych: Normal affect. Extremities: No clubbing or cyanosis.  HEENT: Normal.    Assessment/Plan: 1. CAD: Prior PCI to RCA in 8/12.  LHC (9/17) with nonobstructive disease.  I had started him on Imdur for ?microvascular angina but he developed severe headaches  without any improvement in symptoms.  No chest pain, stable exertional dyspnea.  - Continue ASA 81 and Crestor.  2. Aortic stenosis: Bicuspid valve with moderate AS by echo 9/18.  - He needs a repeat echo to follow aortic stenosis.    3. Hyperlipidemia: Check lipids today.  4. Ascending aortic aneurysm: Associated with bicuspid aortic valve.  4.5 cm ascending aorta in 8/19.  - Repeat CTA chest to follow aortic aneurysm.  5. Chronic diastolic CHF:  He is euvolemic on exam, NYHA class II.   - Continue Lasix 40 mg daily.   - BMET today.  6.  OSA: Continue CPAP.  7. HTN: With ascending aortic aneurysm, need good BP control.  Continue losartan.     Followup in 6 months if echo and CTA are stable.      [name] 12/03/2018

## 2018-12-07 ENCOUNTER — Ambulatory Visit (HOSPITAL_COMMUNITY)
Admission: RE | Admit: 2018-12-07 | Discharge: 2018-12-07 | Disposition: A | Discharge status: Home / Self Care | Payer: PPO | Source: Ambulatory Visit | Attending: Cardiology | Admitting: Cardiology

## 2018-12-07 ENCOUNTER — Other Ambulatory Visit: Payer: Self-pay

## 2018-12-07 DIAGNOSIS — I2510 Atherosclerotic heart disease of native coronary artery without angina pectoris: Secondary | ICD-10-CM | POA: Diagnosis not present

## 2018-12-07 DIAGNOSIS — F17210 Nicotine dependence, cigarettes, uncomplicated: Secondary | ICD-10-CM | POA: Diagnosis not present

## 2018-12-07 DIAGNOSIS — I350 Nonrheumatic aortic (valve) stenosis: Secondary | ICD-10-CM

## 2018-12-07 DIAGNOSIS — I509 Heart failure, unspecified: Secondary | ICD-10-CM | POA: Diagnosis not present

## 2018-12-07 NOTE — Progress Notes (Signed)
  Echocardiogram 2D Echocardiogram has been performed.  Bobbye Charleston 12/07/2018, 2:10 PM

## 2018-12-18 ENCOUNTER — Other Ambulatory Visit: Payer: Self-pay

## 2018-12-18 ENCOUNTER — Ambulatory Visit (HOSPITAL_COMMUNITY)
Admission: RE | Admit: 2018-12-18 | Discharge: 2018-12-18 | Disposition: A | Discharge status: Home / Self Care | Payer: PPO | Source: Ambulatory Visit | Attending: Cardiology | Admitting: Cardiology

## 2018-12-18 DIAGNOSIS — I7121 Aneurysm of the ascending aorta, without rupture: Secondary | ICD-10-CM

## 2018-12-18 DIAGNOSIS — I712 Thoracic aortic aneurysm, without rupture: Secondary | ICD-10-CM | POA: Diagnosis not present

## 2018-12-18 MED ORDER — IOHEXOL 350 MG/ML SOLN
75.0000 mL | Freq: Once | INTRAVENOUS | Status: AC | PRN
  Administered 2018-12-18: 17:00:00 75 mL via INTRAVENOUS

## 2018-12-19 ENCOUNTER — Telehealth (HOSPITAL_COMMUNITY): Payer: Self-pay

## 2018-12-19 DIAGNOSIS — I7121 Aneurysm of the ascending aorta, without rupture: Secondary | ICD-10-CM

## 2018-12-19 DIAGNOSIS — I712 Thoracic aortic aneurysm, without rupture: Secondary | ICD-10-CM

## 2018-12-19 NOTE — Telephone Encounter (Signed)
-----   Message from Larey Dresser, MD sent at 12/19/2018  2:29 PM EDT ----- Stable ascending thoracic aortic aneurysm at 4.6 cm.  Repeat in 1 year.

## 2018-12-19 NOTE — Telephone Encounter (Signed)
Pt aware of results of ct angio. Ordered for repeat in 1 year. Pt appreciative

## 2018-12-26 ENCOUNTER — Other Ambulatory Visit (HOSPITAL_COMMUNITY): Payer: Self-pay | Admitting: Cardiology

## 2018-12-31 ENCOUNTER — Other Ambulatory Visit (HOSPITAL_COMMUNITY): Payer: Self-pay | Admitting: Cardiology

## 2019-02-24 ENCOUNTER — Other Ambulatory Visit: Payer: Self-pay | Admitting: Family Medicine

## 2019-03-12 ENCOUNTER — Other Ambulatory Visit (HOSPITAL_COMMUNITY): Payer: Self-pay | Admitting: Cardiology

## 2019-05-03 ENCOUNTER — Telehealth (INDEPENDENT_AMBULATORY_CARE_PROVIDER_SITE_OTHER): Payer: PPO | Admitting: Cardiology

## 2019-05-03 ENCOUNTER — Encounter: Payer: Self-pay | Admitting: Cardiology

## 2019-05-03 ENCOUNTER — Other Ambulatory Visit: Payer: Self-pay

## 2019-05-03 ENCOUNTER — Telehealth: Payer: Self-pay | Admitting: *Deleted

## 2019-05-03 VITALS — BP 152/84 | HR 60 | Ht 66.0 in | Wt 270.0 lb

## 2019-05-03 DIAGNOSIS — I10 Essential (primary) hypertension: Secondary | ICD-10-CM | POA: Diagnosis not present

## 2019-05-03 DIAGNOSIS — G4733 Obstructive sleep apnea (adult) (pediatric): Secondary | ICD-10-CM

## 2019-05-03 NOTE — Progress Notes (Signed)
Virtual Visit via Telephone Note   This visit type was conducted due to national recommendations for restrictions regarding the COVID-19 Pandemic (e.g. social distancing) in an effort to limit this patient's exposure and mitigate transmission in our community.  Due to his co-morbid illnesses, this patient is at least at moderate risk for complications without adequate follow up.  This format is felt to be most appropriate for this patient at this time.  The patient did not have access to video technology/had technical difficulties with video requiring transitioning to audio format only (telephone).  All issues noted in this document were discussed and addressed.  No physical exam could be performed with this format.  Please refer to the patient's chart for his  consent to telehealth for [facility].  Evaluation Performed:  Follow-up visit  This visit type was conducted due to national recommendations for restrictions regarding the COVID-19 Pandemic (e.g. social distancing).  This format is felt to be most appropriate for this patient at this time.  All issues noted in this document were discussed and addressed.  No physical exam was performed (except for noted visual exam findings with Video Visits).  Please refer to the patient's chart (MyChart message for video visits and phone note for telephone visits) for the patient's consent to telehealth for [facility].  Date:  05/03/2019   ID:  Luis [name], DOB 1955/07/17, MRN [id number]  Patient Location:  Home  Provider location:   [place]  PCP:  Laurey Morale, MD  Cardiologist:  [name], MD Sleep Medicine:  Fransico Him, MD Electrophysiologist:  None   Chief Complaint:  OSA, HTN  History of Present Illness:    Luis [name] is a 64 y.o. male who presents via audio/video conferencing for a telehealth visit today.    This is a 64yo male with a hx of OSA.  He has was diagnosed with OSA a few years ago and was on CPAP  initially but stopped because he could not breath through his nose. He recently underwent PSG showing moderate OSA with an AHI of 24/hr and oxygen desaturations as low as 84%.  He is now on CPAP at 11cm H2O.  He is doing well with his CPAP device and thinks that he has gotten used to it.  He tolerates the mask and feels the pressure is adequate.  He feels rested in the am and has no significant daytime sleepiness.    He does not think that he snores.    The patient does not have symptoms concerning for COVID-19 infection (fever, chills, cough, or new shortness of breath).   Prior CV studies:   The following studies were reviewed today:  PAP compliance download  Past Medical History:  Diagnosis Date  . Anxiety    takes Valium as needed  . Aortic stenosis, moderate   . Arthritis   . Ascending aortic aneurysm ([place])   . CAD (coronary artery disease)    a. s/p PCI of the RCA 8/12 with DES by Dr Burt Knack, preserved EF. b. LHC/RHC (2/16) with mean RA 12, PA 32/15, mean PCWP 18, CI 3.47; patent mid and distal RCA stents, 50-60% proximal stenosis small PDA.   . Carotid stenosis    a. Carotid US (05/2013):  Bilateral 1-39% ICA; L thyroid nodule (prior hx of aspiration).  . Cataract   . Chronic diastolic CHF (congestive heart failure) ([place])   . Depression   . Essential hypertension   . GERD (gastroesophageal reflux disease)  if needed will take OTC meds   . Heart murmur   . History of colonic polyps    hyperplastic  . Hyperlipidemia   . Joint pain   . Myocardial infarction ([place])   . Obesity (BMI 30-39.9) 02/29/2016  . Restless leg   . Sleep apnea    STOPBANG=5  . Tubular adenoma of colon   . Vertigo    takes Meclizine as needed   Past Surgical History:  Procedure Laterality Date  . CATARACT EXTRACTION   4 YRS AGO   BOTH EYES  . CHOLECYSTECTOMY  07/21/2011   Procedure: LAPAROSCOPIC CHOLECYSTECTOMY WITH INTRAOPERATIVE CHOLANGIOGRAM;  Surgeon: Shann Medal, MD;  Location: [facility]S;   Service: General;  Laterality: N/A;  . CORONARY ANGIOPLASTY     2 stents  . coronary stenting     s/p PCI of the RCA by Dr Burt Knack 8/12 with 2 promus stents  . LEFT AND RIGHT HEART CATHETERIZATION WITH CORONARY ANGIOGRAM N/A 06/23/2014   Procedure: LEFT AND RIGHT HEART CATHETERIZATION WITH CORONARY ANGIOGRAM;  Surgeon: Larey Dresser, MD;  Location: [facility] CATH LAB;  Service: Cardiovascular;  Laterality: N/A;  . NECK SURGERY  03/23/09   per Dr. Lorin Mercy, cervical fusion   . right elbow surgery    . solonscopy  05/23/08   per Dr. Wynona Luna hemorrhoids only, repeat in 5 years  . TEE WITHOUT CARDIOVERSION N/A 06/23/2014   Procedure: TRANSESOPHAGEAL ECHOCARDIOGRAM (TEE);  Surgeon: Larey Dresser, MD;  Location: [facility];  Service: Cardiovascular;  Laterality: N/A;  . TEE WITHOUT CARDIOVERSION N/A 01/21/2016   Procedure: TRANSESOPHAGEAL ECHOCARDIOGRAM (TEE);  Surgeon: Larey Dresser, MD;  Location[facility] ENDOSCOPY;  Service: Cardiovascular;  Laterality: N/A;  . TONSILLECTOMY       Current Meds  Medication Sig  . aspirin 81 MG tablet Take 81 mg by mouth every morning.   . citalopram (CELEXA) 40 MG tablet TAKE 1 TABLET BY MOUTH EVERY DAY  . clotrimazole-betamethasone (LOTRISONE) cream Apply 1 application topically 2 (two) times daily. For 1-2 weeks  . diazepam (VALIUM) 5 MG tablet Take 1 tablet (5 mg total) by mouth every 12 (twelve) hours as needed for anxiety.  . furosemide (LASIX) 40 MG tablet TAKE 1 TABLET BY MOUTH EVERY DAY  . ibuprofen (ADVIL,MOTRIN) 200 MG tablet Take 200 mg by mouth 2 (two) times daily as needed for moderate pain.   [name] ipratropium (ATROVENT) 0.03 % nasal spray Place 2 sprays into both nostrils every 12 (twelve) hours as needed for rhinitis.  [name] losartan (COZAAR) 50 MG tablet TAKE 1 TABLET BY MOUTH EVERY DAY  . meclizine (ANTIVERT) 25 MG tablet Take 1 tablet (25 mg total) by mouth 3 (three) times daily as needed for dizziness.  . nitroGLYCERIN (NITROSTAT) 0.4 MG SL  tablet Place 1 tablet (0.4 mg total) under the tongue every 5 (five) minutes as needed. For chest pain.  . potassium chloride (K-DUR) 10 MEQ tablet TAKE 1 TABLET BY MOUTH EVERY DAY  . rosuvastatin (CRESTOR) 20 MG tablet TAKE 1 TABLET BY MOUTH EVERYDAY AT BEDTIME     Allergies:   Patient has no known allergies.   Social History   Tobacco Use  . Smoking status: Former Research scientist (life sciences)  . Smokeless tobacco: Never Used  . Tobacco comment: 5 yrs ago  Substance Use Topics  . Alcohol use: No    Alcohol/week: 0.0 standard drinks  . Drug use: No     Family Hx: The patient's family history includes Esophageal cancer in his cousin;  Lung cancer in his mother. There is no history of Colon cancer, Rectal cancer, or Stomach cancer.  ROS:   Please see the history of present illness.     All other systems reviewed and are negative.   Labs/Other Tests and Data Reviewed:    Recent Labs: 12/03/2018: BUN 16; Creatinine, Ser 1.09; Hemoglobin 13.6; Platelets 165; Potassium 4.9; Sodium 138   Recent Lipid Panel Lab Results  Component Value Date/Time   CHOL 154 12/03/2018 11:55 AM   TRIG 224 (H) 12/03/2018 11:55 AM   HDL 50 12/03/2018 11:55 AM   CHOLHDL 3.1 12/03/2018 11:55 AM   LDLCALC 59 12/03/2018 11:55 AM   LDLDIRECT 145.6 03/16/2007 09:08 AM    Wt Readings from Last 3 Encounters:  05/03/19 270 lb (122.5 kg)  12/03/18 268 lb 9.6 oz (121.8 kg)  10/04/18 269 lb (122 kg)     Objective:    Vital Signs:  Ht 5\' 6"  (1.676 m)   Wt 270 lb (122.5 kg)   BMI 43.58 kg/m      ASSESSMENT & PLAN:    1.  OSA -  The patient is tolerating PAP therapy well without any problems.  The patient has been using and benefiting from PAP use and will continue to benefit from therapy. I will get a download from the DME. I will order him new supplies.   2.  HTN -Bp borderline elevated -he admits to salting his food -I encouraged him to avoid added Na in his diet -continue Losartan 50mg  daily  3.  Morbid obesity   -I have encouraged him to get into a routine exercise program and cut back on carbs and portions.   COVID-19 Education: The signs and symptoms of COVID-19 were discussed with the patient and how to seek care for testing (follow up with PCP or arrange E-visit).  The importance of social distancing was discussed today.  Patient Risk:   After full review of this patient's clinical status, I feel that they are at least moderate risk at this time.  Time:   Today, I have spent 20 minutes directly with the patient on telemedicine discussing medical problems including OSA, HTN and morbid obesity.  We also reviewed the symptoms of COVID 19 and the ways to protect against contracting the virus with telehealth technology.  I spent an additional 5 minutes reviewing patient's chart including labs.  Medication Adjustments/Labs and Tests Ordered: Current medicines are reviewed at length with the patient today.  Concerns regarding medicines are outlined above.  Tests Ordered: No orders of the defined types were placed in this encounter.  Medication Changes: No orders of the defined types were placed in this encounter.   Disposition:  Follow up in 1 year(s)  Signed, Fransico Him, MD  05/03/2019 2:28 PM    [place]

## 2019-05-03 NOTE — Telephone Encounter (Signed)
Order placed to [employer] via community message. 1 year OV made

## 2019-05-03 NOTE — Telephone Encounter (Signed)
-----   Message from Sueanne Margarita, MD sent at 05/03/2019  2:35 PM EST ----- Please order new supplies for his CPAP and a new mask.  Please get a download.  [facility] was down and I could not get on.  FOllowup with me in 1 year

## 2019-05-14 ENCOUNTER — Other Ambulatory Visit (HOSPITAL_COMMUNITY): Payer: Self-pay | Admitting: Cardiology

## 2019-06-13 ENCOUNTER — Telehealth: Payer: Self-pay | Admitting: Family Medicine

## 2019-06-13 NOTE — Progress Notes (Signed)
°  Chronic Care Management   Outreach Note  06/13/2019 Name: ZADYN ALBERDA MRN: [id number] DOB: [birth year]  Referred by: Laurey Morale, MD Reason for referral : No chief complaint on file.   An unsuccessful telephone outreach was attempted today. The patient was referred to the pharmacist for assistance with care management and care coordination.   Follow Up Plan:   Raynicia Dukes [employer] Scheduler

## 2019-06-30 ENCOUNTER — Ambulatory Visit: Payer: PPO | Attending: Internal Medicine

## 2019-06-30 DIAGNOSIS — Z23 Encounter for immunization: Secondary | ICD-10-CM | POA: Insufficient documentation

## 2019-06-30 NOTE — Progress Notes (Signed)
   Covid-19 Vaccination Clinic  Name:  Luis [name]    MRN: [id number] DOB: November 22, 1955  06/30/2019  Mr. Walenta was observed post Covid-19 immunization for 15 minutes without incident. He was provided with Vaccine Information Sheet and instruction to access the V-Safe system.   Mr. Dickes was instructed to call 911 with any severe reactions post vaccine: [name] Difficulty breathing  . Swelling of face and throat  . A fast heartbeat  . A bad rash all over body  . Dizziness and weakness   Immunizations Administered    Name Date Dose VIS Date Route   Pfizer COVID-19 Vaccine 06/30/2019  8:55 AM 0.3 mL 04/05/2019 Intramuscular   Manufacturer: [place]   Lot: [id number]   [place]: [id number]

## 2019-07-03 ENCOUNTER — Telehealth: Payer: Self-pay | Admitting: Family Medicine

## 2019-07-03 NOTE — Progress Notes (Signed)
  Chronic Care Management   Outreach Note  07/03/2019 Name: Luis [name] MRN: [id number] DOB: 1955/10/09  Referred by: Laurey Morale, MD Reason for referral : No chief complaint on file.   A second unsuccessful telephone outreach was attempted today. The patient was referred to pharmacist for assistance with care management and care coordination.  Follow Up Plan:   Raynicia Dukes [employer] Scheduler

## 2019-07-03 NOTE — Chronic Care Management (AMB) (Signed)
°  Chronic Care Management   Note  07/03/2019 Name: Luis [name] MRN: [id number] DOB: [birth year]  Luis [name] is a 64 y.o. year old male who is a primary care patient of Laurey Morale, MD. I reached out to [name] by phone today in response to a referral sent by Luis [name]'s PCP, Laurey Morale, MD.   Luis [name] was given information about Chronic Care Management services today including:  1. CCM service includes personalized support from designated clinical staff supervised by his physician, including individualized plan of care and coordination with other care providers 2. 24/7 contact phone numbers for assistance for urgent and routine care needs. 3. Service will only be billed when office clinical staff spend 20 minutes or more in a month to coordinate care. 4. Only one practitioner may furnish and bill the service in a calendar month. 5. The patient may stop CCM services at any time (effective at the end of the month) by phone call to the office staff.   Patient agreed to services and verbal consent obtained.   Follow up plan:   Raynicia Dukes [employer] Scheduler

## 2019-07-04 ENCOUNTER — Other Ambulatory Visit: Payer: Self-pay

## 2019-07-04 ENCOUNTER — Ambulatory Visit (HOSPITAL_COMMUNITY)
Admission: RE | Admit: 2019-07-04 | Discharge: 2019-07-04 | Disposition: A | Discharge status: Home / Self Care | Payer: PPO | Source: Ambulatory Visit | Attending: Cardiology | Admitting: Cardiology

## 2019-07-04 ENCOUNTER — Encounter (HOSPITAL_COMMUNITY): Payer: Self-pay | Admitting: Cardiology

## 2019-07-04 VITALS — BP 120/70 | HR 57 | Wt 275.0 lb

## 2019-07-04 DIAGNOSIS — E785 Hyperlipidemia, unspecified: Secondary | ICD-10-CM | POA: Diagnosis not present

## 2019-07-04 DIAGNOSIS — I110 Hypertensive heart disease with heart failure: Secondary | ICD-10-CM | POA: Diagnosis not present

## 2019-07-04 DIAGNOSIS — I2510 Atherosclerotic heart disease of native coronary artery without angina pectoris: Secondary | ICD-10-CM | POA: Diagnosis not present

## 2019-07-04 DIAGNOSIS — I10 Essential (primary) hypertension: Secondary | ICD-10-CM

## 2019-07-04 DIAGNOSIS — I350 Nonrheumatic aortic (valve) stenosis: Secondary | ICD-10-CM

## 2019-07-04 DIAGNOSIS — Z7982 Long term (current) use of aspirin: Secondary | ICD-10-CM | POA: Insufficient documentation

## 2019-07-04 DIAGNOSIS — Z79899 Other long term (current) drug therapy: Secondary | ICD-10-CM | POA: Diagnosis not present

## 2019-07-04 DIAGNOSIS — Z86010 Personal history of colonic polyps: Secondary | ICD-10-CM | POA: Insufficient documentation

## 2019-07-04 DIAGNOSIS — G4733 Obstructive sleep apnea (adult) (pediatric): Secondary | ICD-10-CM | POA: Insufficient documentation

## 2019-07-04 DIAGNOSIS — Z8249 Family history of ischemic heart disease and other diseases of the circulatory system: Secondary | ICD-10-CM | POA: Insufficient documentation

## 2019-07-04 DIAGNOSIS — Q231 Congenital insufficiency of aortic valve: Secondary | ICD-10-CM | POA: Insufficient documentation

## 2019-07-04 DIAGNOSIS — Z955 Presence of coronary angioplasty implant and graft: Secondary | ICD-10-CM | POA: Insufficient documentation

## 2019-07-04 DIAGNOSIS — I712 Thoracic aortic aneurysm, without rupture: Secondary | ICD-10-CM | POA: Diagnosis not present

## 2019-07-04 DIAGNOSIS — I5032 Chronic diastolic (congestive) heart failure: Secondary | ICD-10-CM | POA: Diagnosis not present

## 2019-07-04 LAB — BASIC METABOLIC PANEL
Anion gap: 8 (ref 5–15)
BUN: 23 mg/dL (ref 8–23)
CO2: 30 mmol/L (ref 22–32)
Calcium: 9.2 mg/dL (ref 8.9–10.3)
Chloride: 102 mmol/L (ref 98–111)
Creatinine, Ser: 1.03 mg/dL (ref 0.61–1.24)
GFR calc Af Amer: >60 mL/min (ref >60)
GFR calc non Af Amer: >60 mL/min (ref >60)
Glucose, Bld: 134 mg/dL — ABNORMAL HIGH (ref 70–99)
Potassium: 4.8 mmol/L (ref 3.5–5.1)
Sodium: 140 mmol/L (ref 135–145)

## 2019-07-04 NOTE — Progress Notes (Signed)
Patient ID: Luis [name], male   DOB: 07/06/1955, 64 y.o.   MRN: [id number] PCP: Dr. Sarajane Jews Cardiology: Dr. Aundra Dubin  64 y.o. with history of CAD and moderate aortic stenosis presents for follow of AS and CAD. He had RCA PCI in 8/12.     When I saw him in early 2016, he reported significant exertional dyspnea.  I had him do a Lexiscan Cardiolite in 2/16.  This showed no ischemia or infarction.  I also had him get an echo that showed ?bicuspid aortic valve and probably moderate aortic stenosis.  Given ongoing symptoms, I had him do a RHC/LHC and TEE in 2/16.  The RHC showed mildly elevated left and right heart filling pressures and the LHC showed nonobstructive CAD.  TEE confirmed bicuspid aortic valve with moderate AS.  He had PFTs in 4/16 that were within normal limits.  I started him on Lasix 20 mg daily.  He seemed to improve.    Progressive dypnea in fall 2017.  RHC/LHC in 2017 showed mildly elevated left heart filling pressure and nonobstructive CAD.  TEE showed moderate aortic stenosis, bicuspid valve.  Stable ascending aorta dilation.   Echo in 9/18 showed EF 60-65% with bicuspid aortic valve and moderate AS.  CTA chest showed 4.5 cm ascending aorta.  Repeat CTA chest in 8/19 showed stable 4.5 cm ascending aorta.  Echo in 8/20 showed EF 60-65%, moderate AS, 4.5 cm ascending aorta. CTA chest in 8/20 with 4.6 cm ascending aorta.   He has been stable symptomatically.  No chest pain.  He walks his dog for 15-20 minutes a day, gets short of breath walking up hills.  Ok on flat ground.  No orthopnea/PND.  No palpitations.  No lightheadedness.  Weight continues to rise, up another 8 lbs.  Poor diet.       Labs (2/15): K 4.4, creatinine 0.9, LDL 71, HDL 53 Labs (2/16): K 4.8, creatinine 1.0, LDL 62, HDL 57 Labs (8/16): K 4.7, creatinine 0.95, TSH normal, HCT 41.2, LDL 78, HDL 58 Labs (8/17): K 4.3, creatinine 1.05, HCT 40.1, TSH normal, BNP 56 Labs (9/17): creatinine 1.08, BNP 56 Labs (7/18):  creatinine 1.2, LDL 53 Labs (7/19): LDL 68 Labs (8/19): K 5, creatinine 1.09 Labs (8/20): K 4.9, creatinine 1.09, LDL 59, HDL 50  ECG (personally reviewed): NSR, LAFB  PMH: 1. OSA: Using CPAP.  2. CAD: Cardiolite 8/12 with inferior infarct and peri-infarct ischemia, had PCI to the mid and distal RCA with Promus DES x 2.  Lexiscan Cardiolite (2/16) with no ischemia or infarction. LHC/RHC (2/16) with mean RA 12, PA 32/15, mean PCWP 18, CI 3.47; patent mid and distal RCA stents, 50-60% proximal stenosis small PDA.   - LHC/RHC (9/17): 60% proximal PDA; mean RA 2, PA 24/8, mean PCWP 18, CI 2.13.  - Cardiolite (8/17): EF 56%, normal.  3. Carotid stenosis: 2/15 carotid dopplers 1-39% bilateral ICA stenosis.  4. Colon polyps 5. HTN 6. Hyperlipidemia 7. Abdominal US (10/12) with no AAA 8. Aortic stenosis: Echo (2/15) with EF 60-65%, mild LVH, moderate diastolic dysfunction, normal RV size/systolic function, moderate aortic stenosis with mean gradient 31 and AVA 1.16 cm2, ascending aorta 4.4 cm.  Echo (2/16) with EF 60-65%, grade II diastolic dysfunction, ?bicuspid aortic valve with moderate AS (mean gradient 35 mmHg, AVA 1.5 cm^2), mildly dilated RV with normal systolic function.  TEE (2/16) with bicuspid aortic valve, moderate AS mean gradient 30 mmHg, AVA > 1 cm^2, EF 55-60%, mild LVH, mildly dilated RV  with normal systolic function, mild AI, mild MR, ascending aorta 4.2 cm.  - Echo (8/17): EF 60-65%, grade II diastolic dysfunction, bicuspid aortic valve with mean gradient 31 mmHg, probably moderate stenosis.  - TEE (9/17): EF 55-60%, bicuspid aortic valve with moderate AS with mean gradient 24 mmHg and AVA 1.05 cm^2, ascending aorta 4.2 cm.  - Echo (9/18): EF 60-65%, bicuspid aortic valve, AVA 1.05 cm^2 with mean gradient 24 mmHg (moderate AS).  - Echo (8/20): EF 60-65%, mild RV dilation with normal systolic function, moderate AS with AVA 1.46 cm^2 and mean gradient 36 mmHg, 4.5 cm ascending aorta.   9. Ascending aorta aneurysm: 4.4 cm on 2/15 echo. Ascending aorta 4.2 cm TEE 2/16.  Ascending aorta 4.2 cm on 9/17 TEE.  - CTA chest (9/18): 4.5 cm ascending aorta.  - CTA chest (8/19): 4.5 cm ascending aorta - CTA chest (8/20): 4.6 cm ascending aorta 10. Chronic diastolic CHF.  11. PFTs (4/16) were within normal limits.  12. Lower extremity arterial dopplers normal in 8/17.   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  Sister with RyR2 gene.   ROS: All systems reviewed and negative except as per HPI.   Current Outpatient Medications  Medication Sig Dispense Refill  . aspirin 81 MG tablet Take 81 mg by mouth every morning.     . citalopram (CELEXA) 40 MG tablet TAKE 1 TABLET BY MOUTH EVERY DAY 90 tablet 3  . clotrimazole-betamethasone (LOTRISONE) cream Apply 1 application topically 2 (two) times daily. For 1-2 weeks 30 g 0  . diazepam (VALIUM) 5 MG tablet Take 1 tablet (5 mg total) by mouth every 12 (twelve) hours as needed for anxiety. 60 tablet 2  . furosemide (LASIX) 40 MG tablet TAKE 1 TABLET BY MOUTH EVERY DAY 90 tablet 3  . ibuprofen (ADVIL,MOTRIN) 200 MG tablet Take 200 mg by mouth 2 (two) times daily as needed for moderate pain.     [name] ipratropium (ATROVENT) 0.03 % nasal spray Place 2 sprays into both nostrils every 12 (twelve) hours as needed for rhinitis.    [name] losartan (COZAAR) 50 MG tablet TAKE 1 TABLET BY MOUTH EVERY DAY 90 tablet 0  . MAGNESIUM PO Take 500 mg by mouth daily.    . meclizine (ANTIVERT) 25 MG tablet Take 1 tablet (25 mg total) by mouth 3 (three) times daily as needed for dizziness. 30 tablet 0  . nitroGLYCERIN (NITROSTAT) 0.4 MG SL tablet Place 1 tablet (0.4 mg total) under the tongue every 5 (five) minutes as needed. For chest pain. 25 tablet 2  . rosuvastatin (CRESTOR) 20 MG tablet TAKE 1 TABLET BY MOUTH EVERYDAY AT BEDTIME 90 tablet 3   No current facility-administered medications for this encounter.   BP 120/70    Pulse (!) 57   Wt 124.7 kg (275 lb)   SpO2 97%   BMI 44.39 kg/m  General: NAD Neck: No JVD, no thyromegaly or thyroid nodule.  Lungs: Clear to auscultation bilaterally with normal respiratory effort. CV: Nondisplaced PMI.  Heart regular S1/S2, no [id number], 3/6 systolic crescendo-decrescendo murmur RUSB with clear S2.  Trace ankle edema.  No carotid bruit.  Normal pedal pulses.  Abdomen: Soft, nontender, no hepatosplenomegaly, no distention.  Skin: Intact without lesions or rashes.  Neurologic: Alert and oriented x 3.  Psych: Normal affect. Extremities: No clubbing or cyanosis.  HEENT: Normal.   Assessment/Plan: 1. CAD: Prior PCI to RCA in 8/12.  LHC (9/17) with  nonobstructive disease.  I had started him on Imdur for ?microvascular angina but he developed severe headaches without any improvement in symptoms.  No chest pain, stable exertional dyspnea.  - Continue ASA 81 and Crestor.  2. Aortic stenosis: Bicuspid valve with moderate AS by echo 8/20.  He has dyspnea walking up a hill.   - He needs a repeat echo in 8/21 to follow AS.     3. Hyperlipidemia: Good lipids in 8/20.  4. Ascending aortic aneurysm: Associated with bicuspid aortic valve.  4.6 cm ascending aorta in 8/20.  - Repeat CTA chest in 8/21 to follow aortic aneurysm.  5. Chronic diastolic CHF:  He is euvolemic on exam, NYHA class II.   - Continue Lasix 40 mg daily.  I think he can stop KCl based on prior BMETs.  - BMET today.  6.  OSA: Continue CPAP.  7. HTN: With ascending aortic aneurysm, need good BP control.  Continue losartan.     Followup in 6 months with echo   [name] 07/04/2019

## 2019-07-04 NOTE — Patient Instructions (Addendum)
STOP Potassium    Labs today We will only contact you if something comes back abnormal or we need to make some changes. Otherwise no news is good news!   Dr Aundra Dubin ordered for you to have a CT of your chest.  Radiology will call you to schedule this appointment.     Your physician has requested that you have an echocardiogram. Echocardiography is a painless test that uses sound waves to create images of your heart. It provides your doctor with information about the size and shape of your heart and how well your heart's chambers and valves are working. This procedure takes approximately one hour. There are no restrictions for this procedure.   Your physician recommends that you schedule a follow-up appointment in: 6 months for an ECHO and appointment with Dr Aundra Dubin   Please call office at [phone] option [phone] if you have any questions or concerns.    At the [facility], you and your health needs are our priority. As part of our continuing mission to provide you with exceptional heart care, we have created designated Provider Care Teams. These Care Teams include your primary Cardiologist (physician) and Advanced Practice Providers (APPs- Physician Assistants and Nurse Practitioners) who all work together to provide you with the care you need, when you need it.   You may see any of the following providers on your designated Care Team at your next follow up: [name] Dr Glori Bickers . Dr [name] . Darrick Grinder, NP . Lyda Jester, PA . Audry Riles, PharmD   Please be sure to bring in all your medications bottles to every appointment.

## 2019-07-15 ENCOUNTER — Telehealth: Payer: Self-pay

## 2019-07-15 DIAGNOSIS — I5032 Chronic diastolic (congestive) heart failure: Secondary | ICD-10-CM

## 2019-07-15 DIAGNOSIS — I10 Essential (primary) hypertension: Secondary | ICD-10-CM

## 2019-07-15 NOTE — Telephone Encounter (Signed)
I am requesting an ambulatory referral to CCM be placed for this patient. Referral will need to have 2 current diagnosis attached to it. Thank you!  

## 2019-07-16 ENCOUNTER — Ambulatory Visit: Payer: PPO

## 2019-07-16 ENCOUNTER — Other Ambulatory Visit: Payer: Self-pay

## 2019-07-16 DIAGNOSIS — I350 Nonrheumatic aortic (valve) stenosis: Secondary | ICD-10-CM

## 2019-07-16 DIAGNOSIS — I10 Essential (primary) hypertension: Secondary | ICD-10-CM

## 2019-07-16 DIAGNOSIS — I5032 Chronic diastolic (congestive) heart failure: Secondary | ICD-10-CM

## 2019-07-16 DIAGNOSIS — E785 Hyperlipidemia, unspecified: Secondary | ICD-10-CM

## 2019-07-16 DIAGNOSIS — J310 Chronic rhinitis: Secondary | ICD-10-CM

## 2019-07-16 DIAGNOSIS — F411 Generalized anxiety disorder: Secondary | ICD-10-CM

## 2019-07-16 NOTE — Chronic Care Management (AMB) (Signed)
Chronic Care Management Pharmacy  Name: BRICK SODERBERG  MRN: [id number] DOB: [birth year]  Initial Questions: 1. Have you seen any other providers since your last visit? NA 2. Any changes in your medicines or health? No   Chief Complaint/ HPI  [name],  64 y.o. , male presents for their Initial CCM visit with the clinical pharmacist via telephone due to COVID-19 Pandemic.  PCP : Laurey Morale, MD  Their chronic conditions include: HTN, HLD, CAD, diastolic HF, anxiety disorder, chronic rhinitis, vertigo, pain    Office Visits: 07/25/2018- Patient presented to Dr. [name], MD for tick bite on lower abdomen. Patient used cortisone cream for red itchy rash that developed in the area. Patient prescribed 10 day course of doxycycline.   Consult Visit: 07/04/2019- Cardiology- Patient presented to Dr. [name], MD for AS and CAD follow up.  Patient was started on Imdur for angina, but patient developed severe headaches. Patient to continue ASA 81 and Crestor. For aortic stenosis, patient to obtain repeat ECHO on 8/21. Patient to obtain repeat CTA chest on 8/21 for ascending aortic aneurysm. For CHF, patient to continue Lasix 40mg  daily. Patient to stop potassium supplementation based on BMET. Patient to continue losartan for good BP control.   05/03/2019- Cardiology- Patient presented via virtual visit to Dr. Fransico Him, MD for OSA.  Patient tolerating PAP for OSA. For HTN, discussed sodium reduction and to continue losartan 50mg  daily.   Medications: Outpatient Encounter Medications as of 07/16/2019  Medication Sig  . aspirin 81 MG tablet Take 81 mg by mouth every morning.   . citalopram (CELEXA) 40 MG tablet TAKE 1 TABLET BY MOUTH EVERY DAY  . diazepam (VALIUM) 5 MG tablet Take 1 tablet (5 mg total) by mouth every 12 (twelve) hours as needed for anxiety.  . furosemide (LASIX) 40 MG tablet TAKE 1 TABLET BY MOUTH EVERY DAY  . ibuprofen (ADVIL,MOTRIN) 200 MG tablet Take 200  mg by mouth 2 (two) times daily as needed for moderate pain.   [name] ipratropium (ATROVENT) 0.03 % nasal spray Place 2 sprays into both nostrils every 12 (twelve) hours as needed for rhinitis.  [name] losartan (COZAAR) 50 MG tablet TAKE 1 TABLET BY MOUTH EVERY DAY  . MAGNESIUM PO Take 500 mg by mouth daily.  . meclizine (ANTIVERT) 25 MG tablet Take 1 tablet (25 mg total) by mouth 3 (three) times daily as needed for dizziness.  . nitroGLYCERIN (NITROSTAT) 0.4 MG SL tablet Place 1 tablet (0.4 mg total) under the tongue every 5 (five) minutes as needed. For chest pain.  . rosuvastatin (CRESTOR) 20 MG tablet TAKE 1 TABLET BY MOUTH EVERYDAY AT BEDTIME  . clotrimazole-betamethasone (LOTRISONE) cream Apply 1 application topically 2 (two) times daily. For 1-2 weeks (Patient not taking: Reported on 07/16/2019)   No facility-administered encounter medications on file as of 07/16/2019.    Current Diagnosis/Assessment:  Goals Addressed            This Visit's Progress   . Pharmacy Care Plan       CARE PLAN ENTRY  Current Barriers:  . Chronic Disease Management support, education, and care coordination needs related to CHF, CAD, HTN, HLD, and anxiety, chronic rhinitis, vertigo, pain   Pharmacist Clinical Goal(s):  [name] Work with the care management team to address educational, disease management, and care coordination needs  . Call provider office for new or worsened signs and symptoms  . Continue self health monitoring activities as directed today  .  Call care management team with questions or concerns.  . Heart failure: Check weight daily and contact physician if weight gain of more than 3 pounds overnight or more than 5 pounds in a week. . Blood pressure:  [name] Maintain blood pressure within goal of your provider (130/80 or 140/90). . Recent home blood pressure reading: 141/85 mmHg (07/16/2019) . Last office blood pressure reading: 120/70 mmHg (07/16/2019) . High cholesterol:  . Cholesterol goals: Total  Cholesterol goal under 200, Triglycerides goal under 150, HDL goal above 40 (men) or above 50 (women), LDL goal under 70.  [name] Recent cholesterol levels (12/03/2018) - Total cholesterol: 154 - Triglycerides: 224 - HDL: 50 - LDL: 59 . Anxiety: Continue to see improvement in anxiety symptoms . Chronic rhinitis . Minimize symptoms related to allergies.  . Vertigo  . Minimize episodes of dizziness.  . Pain . Continue to see improvement in pain level.   Interventions: . Comprehensive medication review performed. . Evaluation of current treatment plans and patient's adherence to plan as established by provider . Assessed patient's education and care coordination needs . Provided disease specific education to patient . Heart failure . Continue:  - losartan 50mg , 1 tablet once daily  - furosemide 40mg , 1 tablet once daily  . Blood pressure:  . Discussed need to continue checking blood pressure at home.  . Discussed diet modifications. DASH diet:  following a diet emphasizing fruits and vegetables and low-fat dairy products along with whole grains, fish, poultry, and nuts. Reducing red meats and sugars.  . Exercising . Reducing the amount of salt intake to 1500mg /per day.  . Recommend using a salt substitute to replace your salt if you need flavor.     . Weight reduction- We discussed losing 5-10% of body weight . Discussed and reviewed technique for measuring blood pressure . Continue:  -  losartan 50mg , 1 tablet once daily  . High cholesterol/ aortic stenosis  . How to reduce cholesterol through diet/weight management and physical activity.    . We discussed how a diet high in plant sterols (fruits/vegetables/nuts/whole grains/legumes) may reduce your cholesterol.  Encouraged increasing fiber to a daily intake of 10-25g/day  . Continue:  - Rosuvastatin 20mg , 1 tablet once daily at bedtime  - Nitroglycerin 0.4mg  SL tablet, 1 tablet under tongue every five minutes as needed for chest  pain - Aspirin 81mg , 1 tablet daily  . Anxiety . Continue:   - citalopram 40mg , 1 tablet once daily - diazepam 5mg , 1 tablet every twelve hours as needed for anxiety  . Chronic rhinitis . Continue: ipratropium 0.03% nasal spray (Atrovent), 2 sprays into both nostrils every 12 hours as needed . Vertigo . Continue: Meclizine 25mg , 1 tablet three times daily as needed for dizziness.  . Pain . Continue: ibuprofen 200mg , 1 tablet two times daily as needed for moderate pain   Patient Self Care Activities:  . Self administers medications as prescribed and Calls provider office for new concerns or questions . Continue current medications as directed by providers.  . Continue following up with primary care provider and/or specialists. . Continue at home blood pressure readings. . Continue working on health habits (diet/ exercise).  Initial goal documentation        Heart Failure  Type: Diastolic  Last ejection fraction: 60-65% (12/07/2018)  NYHA Class: II (slight limitation of activity)   Patient has failed these meds in past: none  Patient is currently controlled on the following medications:  - losartan 50mg , 1 tablet once  daily  - furosemide 40mg , 1 tablet once daily   We discussed weighing daily; if you gain more than 3 pounds in one day or 5 pounds in one week call your doctor.  - patient denies swelling  - patient notes weighing every morning and states weight up and down. Currently stable at: 275 lbs.   Plan Continue current medications ,  Hypertension  Office blood pressures are  BP Readings from Last 3 Encounters:  07/04/19 120/70  05/03/19 (!) 152/84  12/03/18 131/68   Patient has failed these meds in the past: none   Patient checks BP at home 1-2x per week  Patient home BP readings are ranging: 141/85 mmHg Pulse: 52 bpm   Patient is controlled on:  - losartan 50mg , 1 tablet once daily   We discussed diet and exercise extensively  . DASH diet:  following a  diet emphasizing fruits and vegetables and low-fat dairy products along with whole grains, fish, poultry, and nuts. Reducing red meats and sugars. . Exercising   . Reducing the amount of salt intake to 1500mg /per day.  . Recommend using a salt substitute to replace your salt if you need flavor.    . Weight reduction- We discussed losing 5-10% of body weight.   . Discussed and reviewed technique for measuring blood pressure.   Plan Patient stated he is working on cutting back on potatoes and sodium.  Continue current medications.    Hyperlipidemia/ CAD (aortic stenosis)   Lipid Panel     Component Value Date/Time   CHOL 154 12/03/2018 1155   TRIG 224 (H) 12/03/2018 1155   HDL 50 12/03/2018 1155   CHOLHDL 3.1 12/03/2018 1155   VLDL 45 (H) 12/03/2018 1155   LDLCALC 59 12/03/2018 1155   LDLDIRECT 145.6 03/16/2007 0908    The 10-year ASCVD risk score Mikey Bussing DC Jr., et al., 2013) is: 11.1%   Values used to calculate the score:     Age: 84 years     Sex: Male     Is Non-Hispanic African American: No     Diabetic: No     Tobacco smoker: No     Systolic Blood Pressure: 123456 mmHg     Is BP treated: Yes     HDL Cholesterol: 50 mg/dL     Total Cholesterol: 154 mg/dL   Patient has failed these meds in past: none  Patient is currently managed on the following medications:  - rosuvastatin 20mg , 1 tablet once daily at bedtime  - nitroglycerin 0.4mg  SL tablet, 1 tablet under tongue every five minutes as needed for chest pain - ASA 81mg , 1 tablet daily   We discussed:  diet and exercise extensively  . How to reduce cholesterol through diet/weight management and physical activity.    . We discussed how a diet high in plant sterols (fruits/vegetables/nuts/whole grains/legumes) may reduce your cholesterol.  Encouraged increasing fiber to a daily intake of 10-25g/day   Plan Patient notes has not needed to use nitroglycerin.    Continue current medications.   Anxiety disorder   Patient has  failed these meds in past: escitalopram (cost)  Patient is currently controlled on the following medications:  - citalopram 40mg , 1 tablet once daily - diazepam 5mg , 1 tablet every twelve hours as needed for anxiety   Plan Patient noted not needed to use diazepam lately since being placed on citalopram.  Continue current medications  Chronic rhinitis   Patient has failed these meds in past: none  Patient is  currently controlled on the following medications:  - ipratropium 0.03% nasal spray (Atrovent), 2 sprays into both nostrils every 12 hours as needed (per patient report, has not needed to use it in awhile).   Plan Continue current medications   Vertigo   Patient has failed these meds in past: none  Patient is currently controlled on the following medications:   Meclizine 25mg , 1 tablet three times daily as needed for dizziness.   Plan Patient states has not needed in quite awhile (time not defined).  Continue current medications.   Pain   Patient has failed these meds in past: none  Patient is currently controlled on the following medications:  - ibuprofen 200mg , 1 tablet two times daily as needed for moderate pain (patient notes not using for some weeks).   Plan Continue current medications.   Medication Management  Patient organizes medications: patient uses pill box.  Barriers: denies issues from pharmacy.  Adherence: no gaps in fill history (per medication dispense history 01/17/19 to 07/15/19)   Follow up Follow up visit with PharmD in 6 months Will conduct general telephone calls for periodic check-ins before next visit.   Anson Crofts, PharmD Clinical Pharmacist [facility] [place] [phone]

## 2019-07-16 NOTE — Telephone Encounter (Signed)
Done

## 2019-07-22 NOTE — Patient Instructions (Addendum)
Visit Information  Goals Addressed            This Visit's Progress   . Pharmacy Care Plan       CARE PLAN ENTRY  Current Barriers:  . Chronic Disease Management support, education, and care coordination needs related to CHF, CAD, HTN, HLD, and anxiety, chronic rhinitis, vertigo, pain   Pharmacist Clinical Goal(s):  [name] Work with the care management team to address educational, disease management, and care coordination needs  . Call provider office for new or worsened signs and symptoms  . Continue self health monitoring activities as directed today  . Call care management team with questions or concerns.  . Heart failure: Check weight daily and contact physician if weight gain of more than 3 pounds overnight or more than 5 pounds in a week. . Blood pressure:  [name] Maintain blood pressure within goal of your provider (130/80 or 140/90). . Recent home blood pressure reading: 141/85 mmHg (07/16/2019) . Last office blood pressure reading: 120/70 mmHg (07/16/2019) . High cholesterol:  . Cholesterol goals: Total Cholesterol goal under 200, Triglycerides goal under 150, HDL goal above 40 (men) or above 50 (women), LDL goal under 70.  [name] Recent cholesterol levels (12/03/2018) - Total cholesterol: 154 - Triglycerides: 224 - HDL: 50 - LDL: 59 . Anxiety: Continue to see improvement in anxiety symptoms . Chronic rhinitis . Minimize symptoms related to allergies.  . Vertigo  . Minimize episodes of dizziness.  . Pain . Continue to see improvement in pain level.   Interventions: . Comprehensive medication review performed. . Evaluation of current treatment plans and patient's adherence to plan as established by provider . Assessed patient's education and care coordination needs . Provided disease specific education to patient . Heart failure . Continue:  - losartan 50mg , 1 tablet once daily  - furosemide 40mg , 1 tablet once daily  . Blood pressure:  . Discussed need to continue checking  blood pressure at home.  . Discussed diet modifications. DASH diet:  following a diet emphasizing fruits and vegetables and low-fat dairy products along with whole grains, fish, poultry, and nuts. Reducing red meats and sugars.  . Exercising . Reducing the amount of salt intake to 1500mg /per day.  . Recommend using a salt substitute to replace your salt if you need flavor.     . Weight reduction- We discussed losing 5-10% of body weight . Discussed and reviewed technique for measuring blood pressure . Continue:  -  losartan 50mg , 1 tablet once daily  . High cholesterol/ aortic stenosis  . How to reduce cholesterol through diet/weight management and physical activity.    . We discussed how a diet high in plant sterols (fruits/vegetables/nuts/whole grains/legumes) may reduce your cholesterol.  Encouraged increasing fiber to a daily intake of 10-25g/day  . Continue:  - Rosuvastatin 20mg , 1 tablet once daily at bedtime  - Nitroglycerin 0.4mg  SL tablet, 1 tablet under tongue every five minutes as needed for chest pain - Aspirin 81mg , 1 tablet daily  . Anxiety . Continue:   - citalopram 40mg , 1 tablet once daily - diazepam 5mg , 1 tablet every twelve hours as needed for anxiety  . Chronic rhinitis . Continue: ipratropium 0.03% nasal spray (Atrovent), 2 sprays into both nostrils every 12 hours as needed . Vertigo . Continue: Meclizine 25mg , 1 tablet three times daily as needed for dizziness.  . Pain . Continue: ibuprofen 200mg , 1 tablet two times daily as needed for moderate pain   Patient Self Care Activities:  .  Self administers medications as prescribed and Calls provider office for new concerns or questions . Continue current medications as directed by providers.  . Continue following up with primary care provider and/or specialists. . Continue at home blood pressure readings. . Continue working on health habits (diet/ exercise).  Initial goal documentation        Luis [name]  was given information about Chronic Care Management services today including:  1. CCM service includes personalized support from designated clinical staff supervised by his physician, including individualized plan of care and coordination with other care providers 2. 24/7 contact phone numbers for assistance for urgent and routine care needs. 3. Standard insurance, coinsurance, copays and deductibles apply for chronic care management only during months in which we provide at least 20 minutes of these services. Most insurances cover these services at 100%, however patients may be responsible for any copay, coinsurance and/or deductible if applicable. This service may help you avoid the need for more expensive face-to-face services. 4. Only one practitioner may furnish and bill the service in a calendar month. 5. The patient may stop CCM services at any time (effective at the end of the month) by phone call to the office staff.  Patient agreed to services and verbal consent obtained.   The patient verbalized understanding of instructions provided today and agreed to receive a mailed copy of patient instruction and/or educational materials. Telephone follow up appointment with pharmacy team member scheduled for: 01/16/2020  Anson Crofts, PharmD Clinical Pharmacist [facility] [phone]    [place] stands for "Dietary Approaches to Stop Hypertension." The DASH eating plan is a healthy eating plan that has been shown to reduce high blood pressure (hypertension). It may also reduce your risk for type 2 diabetes, heart disease, and stroke. The DASH eating plan may also help with weight loss. What are tips for following this plan?  General guidelines  Avoid eating more than 2,300 mg (milligrams) of salt (sodium) a day. If you have hypertension, you may need to reduce your sodium intake to 1,500 mg a day.  Limit alcohol intake to no more than 1 drink  a day for nonpregnant women and 2 drinks a day for men. One drink equals 12 oz of beer, 5 oz of wine, or 1 oz of hard liquor.  Work with your health care provider to maintain a healthy body weight or to lose weight. Ask what an ideal weight is for you.  Get at least 30 minutes of exercise that causes your heart to beat faster (aerobic exercise) most days of the week. Activities may include walking, swimming, or biking.  Work with your health care provider or diet and nutrition specialist (dietitian) to adjust your eating plan to your individual calorie needs. Reading food labels   Check food labels for the amount of sodium per serving. Choose foods with less than 5 percent of the Daily Value of sodium. Generally, foods with less than 300 mg of sodium per serving fit into this eating plan.  To find whole grains, look for the word "whole" as the first word in the ingredient list. Shopping  Buy products labeled as "low-sodium" or "no salt added."  Buy fresh foods. Avoid canned foods and premade or frozen meals. Cooking  Avoid adding salt when cooking. Use salt-free seasonings or herbs instead of table salt or sea salt. Check with your health care provider or pharmacist before using salt substitutes.  Do not fry foods.  Cook foods using healthy methods such as baking, boiling, grilling, and broiling instead.  Cook with heart-healthy oils, such as olive, canola, soybean, or sunflower oil. Meal planning  Eat a balanced diet that includes: ? 5 or more servings of fruits and vegetables each day. At each meal, try to fill half of your plate with fruits and vegetables. ? Up to 6-8 servings of whole grains each day. ? Less than 6 oz of lean meat, poultry, or fish each day. A 3-oz serving of meat is about the same size as a deck of cards. One egg equals 1 oz. ? 2 servings of low-fat dairy each day. ? A serving of nuts, seeds, or beans 5 times each week. ? Heart-healthy fats. Healthy fats called  Omega-3 fatty acids are found in foods such as flaxseeds and coldwater fish, like sardines, salmon, and mackerel.  Limit how much you eat of the following: ? Canned or prepackaged foods. ? Food that is high in trans fat, such as fried foods. ? Food that is high in saturated fat, such as fatty meat. ? Sweets, desserts, sugary drinks, and other foods with added sugar. ? Full-fat dairy products.  Do not salt foods before eating.  Try to eat at least 2 vegetarian meals each week.  Eat more home-cooked food and less restaurant, buffet, and fast food.  When eating at a restaurant, ask that your food be prepared with less salt or no salt, if possible. What foods are recommended? The items listed may not be a complete list. Talk with your dietitian about what dietary choices are best for you. Grains Whole-grain or whole-wheat bread. Whole-grain or whole-wheat pasta. Brown rice. [place]. Bulgur. Whole-grain and low-sodium cereals. Pita bread. Low-fat, low-sodium crackers. Whole-wheat flour tortillas. Vegetables Fresh or frozen vegetables (raw, steamed, roasted, or grilled). Low-sodium or reduced-sodium tomato and vegetable juice. Low-sodium or reduced-sodium tomato sauce and tomato paste. Low-sodium or reduced-sodium canned vegetables. Fruits All fresh, dried, or frozen fruit. Canned fruit in natural juice (without added sugar). Meat and other protein foods Skinless chicken or [place]. Ground chicken or [place]. Pork with fat trimmed off. Fish and seafood. Egg whites. Dried beans, peas, or lentils. Unsalted nuts, nut butters, and seeds. Unsalted canned beans. Lean cuts of beef with fat trimmed off. Low-sodium, lean deli meat. Dairy Low-fat (1%) or fat-free (skim) milk. Fat-free, low-fat, or reduced-fat cheeses. Nonfat, low-sodium ricotta or cottage cheese. Low-fat or nonfat yogurt. Low-fat, low-sodium cheese. Fats and oils Soft margarine without trans fats. Vegetable oil. Low-fat,  reduced-fat, or light mayonnaise and salad dressings (reduced-sodium). Canola, safflower, olive, soybean, and sunflower oils. Avocado. Seasoning and other foods Herbs. Spices. Seasoning mixes without salt. Unsalted popcorn and pretzels. Fat-free sweets. What foods are not recommended? The items listed may not be a complete list. Talk with your dietitian about what dietary choices are best for you. Grains Baked goods made with fat, such as croissants, muffins, or some breads. Dry pasta or rice meal packs. Vegetables Creamed or fried vegetables. Vegetables in a cheese sauce. Regular canned vegetables (not low-sodium or reduced-sodium). Regular canned tomato sauce and paste (not low-sodium or reduced-sodium). Regular tomato and vegetable juice (not low-sodium or reduced-sodium). Angie Fava. Olives. Fruits Canned fruit in a light or heavy syrup. Fried fruit. Fruit in cream or butter sauce. Meat and other protein foods Fatty cuts of meat. Ribs. Fried meat. Berniece Salines. Sausage. Bologna and other processed lunch meats. Salami. Fatback. Hotdogs. Bratwurst. Salted nuts and seeds. Canned beans with added salt. Canned or  smoked fish. Whole eggs or egg yolks. Chicken or [place] with skin. Dairy Whole or 2% milk, cream, and half-and-half. Whole or full-fat cream cheese. Whole-fat or sweetened yogurt. Full-fat cheese. Nondairy creamers. Whipped toppings. Processed cheese and cheese spreads. Fats and oils Butter. Stick margarine. Lard. Shortening. Ghee. Bacon fat. Tropical oils, such as coconut, palm kernel, or palm oil. Seasoning and other foods Salted popcorn and pretzels. Onion salt, garlic salt, seasoned salt, table salt, and sea salt. Worcestershire sauce. Tartar sauce. Barbecue sauce. Teriyaki sauce. Soy sauce, including reduced-sodium. Steak sauce. Canned and packaged gravies. Fish sauce. Oyster sauce. Cocktail sauce. Horseradish that you find on the shelf. Ketchup. Mustard. Meat flavorings and tenderizers.  Bouillon cubes. Hot sauce and Tabasco sauce. Premade or packaged marinades. Premade or packaged taco seasonings. Relishes. Regular salad dressings. Where to find more information:  National Heart, Lung, and [place]: https://wilson-eaton.com/  American Heart Association: www.heart.org Summary  The DASH eating plan is a healthy eating plan that has been shown to reduce high blood pressure (hypertension). It may also reduce your risk for type 2 diabetes, heart disease, and stroke.  With the DASH eating plan, you should limit salt (sodium) intake to 2,300 mg a day. If you have hypertension, you may need to reduce your sodium intake to 1,500 mg a day.  When on the DASH eating plan, aim to eat more fresh fruits and vegetables, whole grains, lean proteins, low-fat dairy, and heart-healthy fats.  Work with your health care provider or diet and nutrition specialist (dietitian) to adjust your eating plan to your individual calorie needs. This information is not intended to replace advice given to you by your health care provider. Make sure you discuss any questions you have with your health care provider. Document Revised: 03/24/2017 Document Reviewed: 04/04/2016 Elsevier Patient Education  2020 [employer].

## 2019-07-30 ENCOUNTER — Ambulatory Visit: Payer: PPO | Attending: Internal Medicine

## 2019-07-30 DIAGNOSIS — Z23 Encounter for immunization: Secondary | ICD-10-CM

## 2019-07-30 NOTE — Progress Notes (Signed)
   Covid-19 Vaccination Clinic  Name:  Luis [name]    MRN: [id number] DOB: Feb 17, 1956  07/30/2019  [name] was observed post Covid-19 immunization for 15 minutes without incident. He was provided with Vaccine Information Sheet and instruction to access the V-Safe system.   [name] was instructed to call 911 with any severe reactions post vaccine: [name] Difficulty breathing  . Swelling of face and throat  . A fast heartbeat  . A bad rash all over body  . Dizziness and weakness   Immunizations Administered    Name Date Dose VIS Date Route   Pfizer COVID-19 Vaccine 07/30/2019 10:38 AM 0.3 mL 04/05/2019 Intramuscular   Manufacturer: [employer], [employer]   Lot: [id number]   [place]: [id number]

## 2019-08-08 ENCOUNTER — Encounter: Payer: Self-pay | Admitting: Family Medicine

## 2019-08-08 ENCOUNTER — Ambulatory Visit (INDEPENDENT_AMBULATORY_CARE_PROVIDER_SITE_OTHER): Payer: PPO | Admitting: Family Medicine

## 2019-08-08 ENCOUNTER — Other Ambulatory Visit: Payer: Self-pay

## 2019-08-08 VITALS — BP 140/70 | HR 60 | Temp 98.2°F | Wt 273.2 lb

## 2019-08-08 DIAGNOSIS — N410 Acute prostatitis: Secondary | ICD-10-CM

## 2019-08-08 LAB — POCT URINALYSIS DIPSTICK
Bilirubin, UA: NEGATIVE
Glucose, UA: NEGATIVE
Ketones, UA: NEGATIVE
Leukocytes, UA: NEGATIVE
Nitrite, UA: NEGATIVE
Protein, UA: NEGATIVE
Spec Grav, UA: 1.01 (ref 1.010–1.025)
Urobilinogen, UA: 0.2 E.U./dL
pH, UA: 7 (ref 5.0–8.0)

## 2019-08-08 MED ORDER — CIPROFLOXACIN HCL 500 MG PO TABS: 500.0000 mg | ORAL_TABLET | Freq: Two times a day (BID) | ORAL | 0 refills | Status: DC

## 2019-08-08 NOTE — Progress Notes (Signed)
   Subjective:    Patient ID: Luis [name], male    DOB: [birth year], 64 y.o.   MRN: [id number]  HPI Here for 2 days of seeing dark urine which he thinks may be blood. No urgency or burning. He also notes that for the past 2 months he has had low back pain and mild pain in the testicles. No fever. BMs are normal.    Review of Systems  Constitutional: Negative.   Respiratory: Negative.   Cardiovascular: Negative.   Gastrointestinal: Negative.   Genitourinary: Positive for hematuria and testicular pain. Negative for difficulty urinating, dysuria, flank pain, frequency and urgency.       Objective:   Physical Exam Constitutional:      Appearance: Normal appearance. He is not ill-appearing.  Cardiovascular:     Rate and Rhythm: Normal rate and regular rhythm.     Pulses: Normal pulses.     Heart sounds: Normal heart sounds.  Pulmonary:     Effort: Pulmonary effort is normal.     Breath sounds: Normal breath sounds.  Abdominal:     General: Abdomen is flat. Bowel sounds are normal. There is no distension.     Palpations: Abdomen is soft. There is no mass.     Tenderness: There is no abdominal tenderness. There is no guarding or rebound.     Hernia: No hernia is present.  Genitourinary:    Penis: Normal.      Testes: Normal.     Comments: Prostate is swollen and tender  Neurological:     Mental Status: He is alert.           Assessment & Plan:  Prostatitis, treat with Cipro. Culture the sample. Drink lots of water.  [name], MD

## 2019-08-09 LAB — URINE CULTURE
MICRO NUMBER:: 10368042
Result:: NO GROWTH
SPECIMEN QUALITY:: ADEQUATE

## 2019-08-10 ENCOUNTER — Emergency Department (HOSPITAL_COMMUNITY)
Admission: EM | Admit: 2019-08-10 | Discharge: 2019-08-10 | Disposition: A | Discharge status: Home / Self Care | Payer: PPO | Attending: Emergency Medicine | Admitting: Emergency Medicine

## 2019-08-10 ENCOUNTER — Other Ambulatory Visit: Payer: Self-pay

## 2019-08-10 ENCOUNTER — Emergency Department (HOSPITAL_COMMUNITY): Payer: PPO

## 2019-08-10 ENCOUNTER — Encounter (HOSPITAL_COMMUNITY): Payer: Self-pay | Admitting: Emergency Medicine

## 2019-08-10 DIAGNOSIS — R310 Gross hematuria: Secondary | ICD-10-CM

## 2019-08-10 DIAGNOSIS — R339 Retention of urine, unspecified: Secondary | ICD-10-CM | POA: Insufficient documentation

## 2019-08-10 DIAGNOSIS — R319 Hematuria, unspecified: Secondary | ICD-10-CM | POA: Diagnosis not present

## 2019-08-10 DIAGNOSIS — Z79899 Other long term (current) drug therapy: Secondary | ICD-10-CM | POA: Insufficient documentation

## 2019-08-10 DIAGNOSIS — I714 Abdominal aortic aneurysm, without rupture: Secondary | ICD-10-CM | POA: Insufficient documentation

## 2019-08-10 DIAGNOSIS — N410 Acute prostatitis: Secondary | ICD-10-CM | POA: Insufficient documentation

## 2019-08-10 DIAGNOSIS — I10 Essential (primary) hypertension: Secondary | ICD-10-CM | POA: Diagnosis not present

## 2019-08-10 DIAGNOSIS — I252 Old myocardial infarction: Secondary | ICD-10-CM | POA: Diagnosis not present

## 2019-08-10 DIAGNOSIS — Z7982 Long term (current) use of aspirin: Secondary | ICD-10-CM | POA: Insufficient documentation

## 2019-08-10 DIAGNOSIS — I2510 Atherosclerotic heart disease of native coronary artery without angina pectoris: Secondary | ICD-10-CM | POA: Diagnosis not present

## 2019-08-10 DIAGNOSIS — I5032 Chronic diastolic (congestive) heart failure: Secondary | ICD-10-CM | POA: Diagnosis not present

## 2019-08-10 DIAGNOSIS — N2889 Other specified disorders of kidney and ureter: Secondary | ICD-10-CM | POA: Diagnosis not present

## 2019-08-10 DIAGNOSIS — Z955 Presence of coronary angioplasty implant and graft: Secondary | ICD-10-CM | POA: Diagnosis not present

## 2019-08-10 LAB — CBC WITH DIFFERENTIAL/PLATELET
Abs Immature Granulocytes: 0.04 10*3/uL (ref 0.00–0.07)
Basophils Absolute: 0 10*3/uL (ref 0.0–0.1)
Basophils Relative: 0 %
Eosinophils Absolute: 0.4 10*3/uL (ref 0.0–0.5)
Eosinophils Relative: 4 %
HCT: 40.5 % (ref 39.0–52.0)
Hemoglobin: 13.1 g/dL (ref 13.0–17.0)
Immature Granulocytes: 0 %
Lymphocytes Relative: 23 %
Lymphs Abs: 2 10*3/uL (ref 0.7–4.0)
MCH: 29.4 pg (ref 26.0–34.0)
MCHC: 32.3 g/dL (ref 30.0–36.0)
MCV: 90.8 fL (ref 80.0–100.0)
Monocytes Absolute: 0.7 10*3/uL (ref 0.1–1.0)
Monocytes Relative: 7 %
Neutro Abs: 5.8 10*3/uL (ref 1.7–7.7)
Neutrophils Relative %: 66 %
Platelets: 169 10*3/uL (ref 150–400)
RBC: 4.46 MIL/uL (ref 4.22–5.81)
RDW: 13.7 % (ref 11.5–15.5)
WBC: 8.9 10*3/uL (ref 4.0–10.5)
nRBC: 0 % (ref 0.0–0.2)

## 2019-08-10 LAB — COMPREHENSIVE METABOLIC PANEL
ALT: 30 U/L (ref 0–44)
AST: 22 U/L (ref 15–41)
Albumin: 3.8 g/dL (ref 3.5–5.0)
Alkaline Phosphatase: 70 U/L (ref 38–126)
Anion gap: 11 (ref 5–15)
BUN: 21 mg/dL (ref 8–23)
CO2: 26 mmol/L (ref 22–32)
Calcium: 8.9 mg/dL (ref 8.9–10.3)
Chloride: 101 mmol/L (ref 98–111)
Creatinine, Ser: 1.04 mg/dL (ref 0.61–1.24)
GFR calc Af Amer: >60 mL/min (ref >60)
GFR calc non Af Amer: >60 mL/min (ref >60)
Glucose, Bld: 101 mg/dL — ABNORMAL HIGH (ref 70–99)
Potassium: 4.1 mmol/L (ref 3.5–5.1)
Sodium: 138 mmol/L (ref 135–145)
Total Bilirubin: 1.1 mg/dL (ref 0.3–1.2)
Total Protein: 7.1 g/dL (ref 6.5–8.1)

## 2019-08-10 LAB — URINALYSIS, ROUTINE W REFLEX MICROSCOPIC

## 2019-08-10 LAB — LIPASE, BLOOD: Lipase: 25 U/L (ref 11–51)

## 2019-08-10 LAB — URINALYSIS, MICROSCOPIC (REFLEX)
Bacteria, UA: NONE SEEN
RBC / HPF: >50 RBC/hpf (ref 0–5)

## 2019-08-10 LAB — PROTIME-INR
INR: 1.1 (ref 0.8–1.2)
Prothrombin Time: 14.1 seconds (ref 11.4–15.2)

## 2019-08-10 MED ORDER — SODIUM CHLORIDE 0.9 % IV SOLN
1.0000 g | Freq: Once | INTRAVENOUS | Status: AC
  Administered 2019-08-10: 1 g via INTRAVENOUS
  Filled 2019-08-10: qty 10

## 2019-08-10 MED ORDER — LIDOCAINE HCL URETHRAL/MUCOSAL 2 % EX GEL
1.0000 "application " | Freq: Once | CUTANEOUS | Status: AC
  Administered 2019-08-10: 1 via TOPICAL

## 2019-08-10 MED ORDER — IOHEXOL 300 MG/ML  SOLN
100.0000 mL | Freq: Once | INTRAMUSCULAR | Status: AC | PRN
  Administered 2019-08-10: 100 mL via INTRAVENOUS

## 2019-08-10 MED ORDER — CEFPODOXIME PROXETIL 200 MG PO TABS: 200.0000 mg | ORAL_TABLET | Freq: Two times a day (BID) | ORAL | 0 refills | Status: AC

## 2019-08-10 MED ORDER — SODIUM CHLORIDE 0.9 % IV BOLUS
500.0000 mL | Freq: Once | INTRAVENOUS | Status: AC
  Administered 2019-08-10: 500 mL via INTRAVENOUS

## 2019-08-10 MED ORDER — DIAZEPAM 2 MG PO TABS
2.0000 mg | ORAL_TABLET | Freq: Once | ORAL | Status: AC
  Administered 2019-08-10: 2 mg via ORAL
  Filled 2019-08-10: qty 1

## 2019-08-10 MED ORDER — TAMSULOSIN HCL 0.4 MG PO CAPS
0.4000 mg | ORAL_CAPSULE | Freq: Once | ORAL | Status: AC
  Administered 2019-08-10: 17:00:00 0.4 mg via ORAL
  Filled 2019-08-10: qty 1

## 2019-08-10 NOTE — ED Provider Notes (Signed)
[facility] EMERGENCY DEPARTMENT Provider Note   CSN: [id number] Arrival date & time: 08/10/19  [id number]     History Chief Complaint  Patient presents with  . Hematuria    Luis [name] is a 64 y.o. male.  64 year old male with past medical history of ascending aortic aneurysm (4.6cm on 12/19/2018), coronary artery disease (with stents), CHF, hypertension, hyperlipidemia, aortic stenosis presents with complaint of hematuria.  Patient states that he has had a discomfort in his low back and testicles for the past month, worse with having to sit in the car, improves if he reclines the seat slightly.  Patient noticed 2 days ago that his urine looked dark/bloody, went to his PCP who we did a rectal exam and was told that he had acute prostatitis and was started on Cipro.  Patient states he is taking 5 of the Cipro tablets however his symptoms seem to be getting worse, reports he is now passing clots which interrupt his stream.  He denies urinary retention or dysuria.  Patient states he has occasional bright red blood in his stools, states this has been ongoing for several years, suspects it is due to a hemorrhoid, had his colonoscopy within the past year.  Patient is unaware of PSA trend.  Patient denies unintentional weight loss, night sweats, fevers, nausea, vomiting, swelling or changes to his testicles.  Takes a daily 81 mg aspirin, otherwise not anticoagulated.  No other complaints or concerns today.        Past Medical History:  Diagnosis Date  . Anxiety    takes Valium as needed  . Aortic stenosis, moderate   . Arthritis   . Ascending aortic aneurysm ([place])   . CAD (coronary artery disease)    a. s/p PCI of the RCA 8/12 with DES by Dr Burt Knack, preserved EF. b. LHC/RHC (2/16) with mean RA 12, PA 32/15, mean PCWP 18, CI 3.47; patent mid and distal RCA stents, 50-60% proximal stenosis small PDA.   . Carotid stenosis    a. Carotid US (05/2013):  Bilateral 1-39% ICA; L  thyroid nodule (prior hx of aspiration).  . Cataract   . Chronic diastolic CHF (congestive heart failure) ([place])   . Depression   . Essential hypertension   . GERD (gastroesophageal reflux disease)    if needed will take OTC meds   . Heart murmur   . History of colonic polyps    hyperplastic  . Hyperlipidemia   . Joint pain   . Myocardial infarction ([place])   . Obesity (BMI 30-39.9) 02/29/2016  . Restless leg   . Sleep apnea    STOPBANG=5  . Tubular adenoma of colon   . Vertigo    takes Meclizine as needed    Patient Active Problem List   Diagnosis Date Noted  . Morbid obesity ([place]) 02/29/2016  . Essential hypertension   . Ascending aortic aneurysm ([place])   . Rhinitis, chronic 01/14/2015  . Generalized anxiety disorder 01/14/2015  . Aortic stenosis, moderate 01/02/2015  . Chronic diastolic CHF (congestive heart failure) ([place]) 06/30/2014  . OSA (obstructive sleep apnea) 02/13/2012  . Fatigue 01/09/2012  . Gall bladder disease 06/16/2011  . Hyperlipidemia 02/28/2011  . Angina of effort ([place]) 12/08/2010  . CAD (coronary artery disease) 12/08/2010  . Tobacco abuse 12/08/2010  . Aortic stenosis 12/08/2010  . ANAL FISSURE 04/23/2009  . ACUTE PROSTATITIS 08/01/2008  . INTERNAL HEMORRHOIDS 05/22/2008  . COLONIC POLYPS 02/07/2008  . CAROTID ARTERY STENOSIS 02/07/2008  . ANXIETY  01/09/2007    Past Surgical History:  Procedure Laterality Date  . CATARACT EXTRACTION   4 YRS AGO   BOTH EYES  . CHOLECYSTECTOMY  07/21/2011   Procedure: LAPAROSCOPIC CHOLECYSTECTOMY WITH INTRAOPERATIVE CHOLANGIOGRAM;  Surgeon: Shann Medal, MD;  Location: [facility] ORS;  Service: General;  Laterality: N/A;  . CORONARY ANGIOPLASTY     2 stents  . coronary stenting     s/p PCI of the RCA by Dr Burt Knack 8/12 with 2 promus stents  . LEFT AND RIGHT HEART CATHETERIZATION WITH CORONARY ANGIOGRAM N/A 06/23/2014   Procedure: LEFT AND RIGHT HEART CATHETERIZATION WITH CORONARY ANGIOGRAM;  Surgeon: Larey Dresser,  MD;  Location: [facility] CATH LAB;  Service: Cardiovascular;  Laterality: N/A;  . NECK SURGERY  03/23/09   per Dr. Lorin Mercy, cervical fusion   . right elbow surgery    . solonscopy  05/23/08   per Dr. Wynona Luna hemorrhoids only, repeat in 5 years  . TEE WITHOUT CARDIOVERSION N/A 06/23/2014   Procedure: TRANSESOPHAGEAL ECHOCARDIOGRAM (TEE);  Surgeon: Larey Dresser, MD;  Location: [place];  Service: Cardiovascular;  Laterality: N/A;  . TEE WITHOUT CARDIOVERSION N/A 01/21/2016   Procedure: TRANSESOPHAGEAL ECHOCARDIOGRAM (TEE);  Surgeon: Larey Dresser, MD;  Location: [facility] ENDOSCOPY;  Service: Cardiovascular;  Laterality: N/A;  . TONSILLECTOMY         Family History  Problem Relation Age of Onset  . Lung cancer Mother        lung  . Esophageal cancer Cousin   . Colon cancer Neg Hx   . Rectal cancer Neg Hx   . Stomach cancer Neg Hx     Social History   Tobacco Use  . Smoking status: Former Research scientist (life sciences)  . Smokeless tobacco: Never Used  . Tobacco comment: 5 yrs ago  Substance Use Topics  . Alcohol use: No    Alcohol/week: 0.0 standard drinks  . Drug use: No    Home Medications Prior to Admission medications   Medication Sig Start Date End Date Taking? Authorizing Provider  aspirin 81 MG tablet Take 81 mg by mouth every morning.    Yes [provider]  citalopram (CELEXA) 40 MG tablet TAKE 1 TABLET BY MOUTH EVERY DAY Patient taking differently: Take 40 mg by mouth daily.  02/25/19  Yes Laurey Morale, MD  diazepam (VALIUM) 5 MG tablet Take 1 tablet (5 mg total) by mouth every 12 (twelve) hours as needed for anxiety. 05/22/18  Yes Laurey Morale, MD  furosemide (LASIX) 40 MG tablet TAKE 1 TABLET BY MOUTH EVERY DAY Patient taking differently: Take 40 mg by mouth daily.  09/03/18  Yes Larey Dresser, MD  ibuprofen (ADVIL,MOTRIN) 200 MG tablet Take 200 mg by mouth 2 (two) times daily as needed for moderate pain.    Yes [provider]  ipratropium (ATROVENT) 0.03 %  nasal spray Place 2 sprays into both nostrils every 12 (twelve) hours as needed for rhinitis.   Yes [provider]  losartan (COZAAR) 50 MG tablet TAKE 1 TABLET BY MOUTH EVERY DAY Patient taking differently: Take 50 mg by mouth daily.  05/14/19  Yes Larey Dresser, MD  MAGNESIUM PO Take 500 mg by mouth daily.   Yes [provider]  meclizine (ANTIVERT) 25 MG tablet Take 1 tablet (25 mg total) by mouth 3 (three) times daily as needed for dizziness. 10/21/16  Yes Laurey Morale, MD  nitroGLYCERIN (NITROSTAT) 0.4 MG SL tablet Place 1 tablet (0.4 mg total) under the  tongue every 5 (five) minutes as needed. For chest pain. 11/03/17 12/05/25 Yes Larey Dresser, MD  rosuvastatin (CRESTOR) 20 MG tablet TAKE 1 TABLET BY MOUTH EVERYDAY AT BEDTIME Patient taking differently: Take 20 mg by mouth at bedtime.  12/26/18  Yes Larey Dresser, MD  cefpodoxime (VANTIN) 200 MG tablet Take 1 tablet (200 mg total) by mouth 2 (two) times daily for 14 days. 08/10/19 08/24/19  Tacy Learn, PA-C  clotrimazole-betamethasone (LOTRISONE) cream Apply 1 application topically 2 (two) times daily. For 1-2 weeks Patient not taking: Reported on 08/10/2019 10/04/18   Pyrtle, Lajuan Lines, MD    Allergies    Patient has no known allergies.  Review of Systems   Review of Systems  Constitutional: Negative for chills, diaphoresis and fever.  Respiratory: Negative for shortness of breath.   Cardiovascular: Negative for chest pain.  Gastrointestinal: Positive for blood in stool. Negative for abdominal pain, constipation, diarrhea, nausea and vomiting.  Genitourinary: Positive for difficulty urinating, hematuria and testicular pain. Negative for dysuria, flank pain, frequency and scrotal swelling.  Musculoskeletal: Positive for back pain. Negative for arthralgias and myalgias.  Skin: Negative for rash and wound.  Allergic/Immunologic: Negative for immunocompromised state.  Neurological: Negative for dizziness and  weakness.  Hematological: Does not bruise/bleed easily.  All other systems reviewed and are negative.   Physical Exam Updated Vital Signs BP 133/78   Pulse (!) 59   Temp 98.4 F (36.9 C) (Oral)   Resp 20   SpO2 95%   Physical Exam Vitals and nursing note reviewed.  Constitutional:      General: He is not in acute distress.    Appearance: He is well-developed. He is not diaphoretic.  HENT:     Head: Normocephalic and atraumatic.  Cardiovascular:     Rate and Rhythm: Normal rate and regular rhythm.     Pulses: Normal pulses.     Heart sounds: Murmur present.  Pulmonary:     Effort: Pulmonary effort is normal.     Breath sounds: Normal breath sounds.  Abdominal:     General: Abdomen is protuberant.     Tenderness: There is no abdominal tenderness.  Genitourinary:    Comments: Deferred, not had rectal exam tender prostate.  Denies testicle pain with palpation. Musculoskeletal:     Right lower leg: No edema.     Left lower leg: No edema.  Skin:    General: Skin is warm and dry.     Findings: No erythema or rash.  Neurological:     Mental Status: He is alert and oriented to person, place, and time.  Psychiatric:        Behavior: Behavior normal.     ED Results / Procedures / Treatments   Labs (all labs ordered are listed, but only abnormal results are displayed) Labs Reviewed  URINALYSIS, ROUTINE W REFLEX MICROSCOPIC - Abnormal; Notable for the following components:      Result Value   Color, Urine RED (*)    APPearance TURBID (*)    Glucose, UA   (*)    Value: TEST NOT REPORTED DUE TO COLOR INTERFERENCE OF URINE PIGMENT   Hgb urine dipstick   (*)    Value: TEST NOT REPORTED DUE TO COLOR INTERFERENCE OF URINE PIGMENT   Bilirubin Urine   (*)    Value: TEST NOT REPORTED DUE TO COLOR INTERFERENCE OF URINE PIGMENT   Ketones, ur   (*)    Value: TEST NOT REPORTED DUE TO COLOR  INTERFERENCE OF URINE PIGMENT   Protein, ur   (*)    Value: TEST NOT REPORTED DUE TO COLOR  INTERFERENCE OF URINE PIGMENT   Nitrite   (*)    Value: TEST NOT REPORTED DUE TO COLOR INTERFERENCE OF URINE PIGMENT   Leukocytes,Ua   (*)    Value: TEST NOT REPORTED DUE TO COLOR INTERFERENCE OF URINE PIGMENT   All other components within normal limits  URINALYSIS, MICROSCOPIC (REFLEX) - Abnormal; Notable for the following components:   Non Squamous Epithelial PRESENT (*)    All other components within normal limits  COMPREHENSIVE METABOLIC PANEL - Abnormal; Notable for the following components:   Glucose, Bld 101 (*)    All other components within normal limits  URINE CULTURE  CBC WITH DIFFERENTIAL/PLATELET  LIPASE, BLOOD  PROTIME-INR    EKG None  Radiology CT Abdomen Pelvis W Contrast  Result Date: 08/10/2019 CLINICAL DATA:  Hematuria EXAM: CT ABDOMEN AND PELVIS WITH CONTRAST TECHNIQUE: Multidetector CT imaging of the abdomen and pelvis was performed using the standard protocol following bolus administration of intravenous contrast. CONTRAST:  123mL OMNIPAQUE IOHEXOL 300 MG/ML  SOLN COMPARISON:  Overlapping portion of CT a chest 12/18/2018; abdominal ultrasound of 06/05/2011 FINDINGS: Lower chest: Mild cardiomegaly. Coronary atherosclerosis. Calcifications of the aortic valve. Hepatobiliary: Cholecystectomy. No biliary dilatation. Pancreas: Unremarkable Spleen: Unremarkable Adrenals/Urinary Tract: Both adrenal glands appear normal. 10 mm hypodense lesion of the left kidney upper pole, technically too small to characterize although statistically likely to be a cyst. Similar 8 mm lesion of the left kidney lower pole, image 58/6. Exophytic 2.7 by 2.0 cm lesion of the left kidney lower pole noted, similar approximate fluid density on portal venous and delayed phase images, most likely to be a cyst. No stones are identified. No hydronephrosis or hydroureter. Please note that the with and without contrast hematuria protocol was not utilized on today's exam. Stomach/Bowel: Unremarkable  Vascular/Lymphatic: Aortoiliac atherosclerotic vascular disease. No pathologic adenopathy. Reproductive: The prostate gland measures 5.4 by 4.7 by 5.9 cm (volume = 78 cm^3), compatible prostatomegaly. Subtle indistinctness of the margins of the prostate gland potentially from low-grade inflammation. No abscess identified. Other: No supplemental non-categorized findings. Musculoskeletal: Lumbar spondylosis and degenerative disc disease causing bilateral foraminal impingement at L4-5 and L5-S1 and probable central narrowing of the thecal sac at L4-5. IMPRESSION: 1. Subtle indistinctness of the margins of the prostate gland potentially from low-grade inflammation. Moderate prostatomegaly. No abscess identified. Please note that the with and without contrast hematuria protocol was not utilized on today's exam. 2. Other imaging findings of potential clinical significance: Mild cardiomegaly. Coronary atherosclerosis. Calcifications of the aortic valve. Lumbar spondylosis and degenerative disc disease causing bilateral foraminal impingement at L4-5 and L5-S1 and probable central narrowing of the thecal sac at L4-5. Hypodense renal lesions are technically too small to characterize although likely cysts. 3. Aortic atherosclerosis. Aortic Atherosclerosis (ICD10-I70.0). Electronically Signed   By: Van Clines M.D.   On: 08/10/2019 15:06    Procedures Procedures (including critical care time)  Medications Ordered in ED Medications  sodium chloride 0.9 % bolus 500 mL (0 mLs Intravenous Stopped 08/10/19 1356)  iohexol (OMNIPAQUE) 300 MG/ML solution 100 mL (100 mLs Intravenous Contrast Given 08/10/19 1430)  cefTRIAXone (ROCEPHIN) 1 g in sodium chloride 0.9 % 100 mL IVPB (0 g Intravenous Stopped 08/10/19 1712)  tamsulosin (FLOMAX) capsule 0.4 mg (0.4 mg Oral Given 08/10/19 1709)  lidocaine (XYLOCAINE) 2 % jelly 1 application (1 application Topical Given 08/10/19 1915)  diazepam (VALIUM)  tablet 2 mg (2 mg Oral Given  08/10/19 1913)    ED Course  I have reviewed the triage vital signs and the nursing notes.  Pertinent labs & imaging results that were available during my care of the patient were reviewed by me and considered in my medical decision making (see chart for details).  Clinical Course as of Aug 09 2028  Sat Aug 10, 3243  4113 64 year old male presents with complaint of low back pain with discomfort in his testicles for the past month, now with gross hematuria and passing clots.  Patient was seen by PCP 2 days ago, started on Cipro for suspected acute prostatitis.  Patient presents to the ER today as he is now passing clots with some difficulty.  Patient is able to empty his bladder, denies fevers or vomiting.   [LM]  [id number] On exam, no abdominal pain, no CVA tenderness, patient is otherwise well-appearing with reassuring vital signs.  Review of labs, CBC is within normal limits, hemoglobin hematocrit within normal limits, platelets normal.  Lipase within normal limits, CMP without significant findings, specifically normal BUN and creatinine.  Urinalysis unable to be completed due to gross hematuria however no bacteria seen, greater than 50 red blood cells and 6-10 white cells on micro.  Will attempt to add culture to the sample. CT abdomen pelvis with contrast shows enlarged prostate without abscess.  Other chronic findings as documented. Case discussed with Dr. Alyson Ingles on-call with urology, recommends Rocephin while in the ER, discontinue Cipro due to resistance concerns and start Vantin.  Advised patient to discontinue his aspirin for the next several days, return to ER for fevers or inability to void or other concerns.  Patient to call urology office on Monday to schedule follow-up and be seen before Wednesday.  Patient and wife verbalized understanding of discharge instructions and plan.   [LM]  1720 After antibiotics, patient reports difficulty voiding/unable to empty bladder. Plan is to irrigate  bladder prior to discharge.    [LM]  2030 Patient unable to void for several hours while awaiting catheter with irrigation, patient will be sent home with catheter in place, concern he will bounce back to the ER with retention although advised patient this is still a possibility and if his Foley is not draining he should return.   [LM]    Clinical Course User Index [LM] Roque Lias   MDM Rules/Calculators/A&P                      Final Clinical Impression(s) / ED Diagnoses Final diagnoses:  Gross hematuria  Acute prostatitis    Rx / DC Orders ED Discharge Orders         Ordered    cefpodoxime (VANTIN) 200 MG tablet  2 times daily     08/10/19 1551           Roque Lias 08/10/19 2031    Quintella Reichert, MD 08/12/19 1359

## 2019-08-10 NOTE — ED Notes (Signed)
Bladder scan showed 225 mL, pt complaining of urine retention. Foley cath to be placed per PA orders.

## 2019-08-10 NOTE — ED Triage Notes (Signed)
Reports hematuria since Tuesday.  Seen by PCP on Thursday and started on antibiotic.  States urine is more red now with clots.  Called PCP office and told to come to ED.  Denies pain.  Denies fever and chills.

## 2019-08-10 NOTE — ED Notes (Signed)
Pt ambulated to restroom with steady gait.

## 2019-08-10 NOTE — ED Notes (Signed)
Discharge instructions reviewed with pt and family. Pt educated on foley care and follow up care. Pt verbalized understanding.

## 2019-08-10 NOTE — ED Notes (Signed)
Foley catheter placed, 275 mL of blood tinged urine returned. RN irrigated with total of 350 mL sterile water and 350 mL was returned. Returned irrigation still blood tinged.

## 2019-08-10 NOTE — Discharge Instructions (Addendum)
Stop the Cipro.  There is a new prescription for Vantin to take, sent to your pharmacy. Follow up with urology, call Monday to schedule an appointment to be seen before Wednesday. Return to the ER for worsening or concerning symptoms especially feeling weak/dizzy, fever, inability to empty your bladder/catheter not draining.

## 2019-08-12 ENCOUNTER — Other Ambulatory Visit (HOSPITAL_COMMUNITY): Payer: Self-pay | Admitting: Cardiology

## 2019-08-14 DIAGNOSIS — R310 Gross hematuria: Secondary | ICD-10-CM | POA: Diagnosis not present

## 2019-08-14 DIAGNOSIS — N410 Acute prostatitis: Secondary | ICD-10-CM | POA: Diagnosis not present

## 2019-08-29 ENCOUNTER — Other Ambulatory Visit (HOSPITAL_COMMUNITY): Payer: Self-pay | Admitting: Cardiology

## 2019-09-03 DIAGNOSIS — R310 Gross hematuria: Secondary | ICD-10-CM | POA: Diagnosis not present

## 2019-09-05 ENCOUNTER — Other Ambulatory Visit: Payer: Self-pay | Admitting: Urology

## 2019-09-24 ENCOUNTER — Telehealth (HOSPITAL_COMMUNITY): Payer: Self-pay

## 2019-09-24 NOTE — Telephone Encounter (Signed)
Received a fax requesting medical Records to be sent to [facility]. Records were successfully faxed to: [phone]. Medical request form will be scanned into patients chart.

## 2019-10-06 NOTE — H&P (Signed)
HPI: 08/14/19 the patient was seen in the ER on 08/10/19 with gross hematuria that had started 48 hours prior. He was seen by his primary care physician who performed a rectal examination and diagnosed acute prostatitis and placed the patient on Cipro but he said his hematuria became worse. It was also associated with low back pain and bilateral testicular pain that seems to be worse when sitting. He does take aspirin but no other anticoagulants. A CT scan with contrast was performed in the ER that revealed left renal cysts but no stones or hydronephrosis and an enlarged prostate. He was told to stop his aspirin.  He has remained on antibiotics and his urine has completely cleared. He has no prior history of gross hematuria. His low back pain seems to be a little bit better. He said it seems to get worse when he rides in the car but if he lays the seat back it improved. His testicular pain which was present previous to starting the antibiotics has now resolved as well. He does not have any voiding difficulties as a baseline and does have a significant tobacco history having an 80 pack-year history. He has stopped smoking.   09/03/19: He returns today for completion of his workup of gross hematuria. I postponed his cystoscopic evaluation due to the fact that he had a Foley catheter indwelling at his last visit which would result in bladder irritation potentially indistinguishable from urothelial malignancy. He reports he has been doing well with his catheter out. He said just today he started having some slight burning with urination. He has not seen any further gross hematuria.     ALLERGIES: No Allergies    MEDICATIONS: Crestor 20 mg tablet  Aspirin Ec 81 mg tablet, delayed release  Atrovent Hfa 17 mcg/actuation hfa aerosol with adapter  Cefpodoxime Proxetil 200 mg tablet 1 tablet PO Daily  Escitalopram Oxalate 20 mg tablet  Furosemide 40 mg tablet  Losartan Potassium 50 mg tablet  Meclizine Hcl 25 mg  tablet  Nitroglycerin 0.4 mg tablet, sublingual     GU PSH: No GU PSH    NON-GU PSH: Cardiac Stent Placement Cholecystectomy (laparoscopic) Elbow Arthroscopy/surgery, Right Neck Spine Fusion     GU PMH: Acute prostatitis, He may have had an acute prostatitis although no showed positivity. He has cleared his hematuria, his testicular pain has improved as well and so I have recommended he complete his current course of antibiotics. - 08/14/2019 Gross hematuria, He had gross hematuria and has a significant smoking history. His upper tract study revealed no abnormality and he will need cystoscopy however that will not be performed today since he has had a catheter indwelling and will have catheter irritation so I am going to let his bladder heal up from that and have him return in 3 weeks for cystoscopy. - 08/14/2019      PMH Notes:  1898-04-25 00:00:00 - Note: Normal Routine History And Physical Adult   NON-GU PMH: Aortic stenosis Atherosclerosis of aorta Hypercholesterolemia Hypertension    FAMILY HISTORY: Lung Cancer - Mother   SOCIAL HISTORY: Marital Status: Married Preferred Language: English; Ethnicity: Not Hispanic Or Latino; Race: White Current Smoking Status: Patient does not smoke anymore.   Tobacco Use Assessment Completed: Used Tobacco in last 30 days? Does not use smokeless tobacco. Has never drank.  Does not use drugs. Drinks 4+ caffeinated drinks per day.    REVIEW OF SYSTEMS:    GU Review Male:   Patient denies stream starts and  stops, trouble starting your stream, get up at night to urinate, hard to postpone urination, frequent urination, penile pain, burning/ pain with urination, leakage of urine, erection problems, and have to strain to urinate .  Gastrointestinal (Upper):   Patient denies nausea, vomiting, and indigestion/ heartburn.  Gastrointestinal (Lower):   Patient denies diarrhea and constipation.  Constitutional:   Patient denies fever, night sweats,  weight loss, and fatigue.  Skin:   Patient denies skin rash/ lesion and itching.  Eyes:   Patient denies blurred vision and double vision.  Ears/ Nose/ Throat:   Patient denies sore throat and sinus problems.  Hematologic/Lymphatic:   Patient denies swollen glands and easy bruising.  Cardiovascular:   Patient denies leg swelling and chest pains.  Respiratory:   Patient denies cough and shortness of breath.  Endocrine:   Patient denies excessive thirst.  Musculoskeletal:   Patient denies back pain and joint pain.  Neurological:   Patient denies headaches and dizziness.  Psychologic:   Patient denies depression and anxiety.   VITAL SIGNS: None   GU PHYSICAL EXAMINATION:    Urethral Meatus: Normal size. No lesion, no wart, no polyp, no balanitis, no discharge. Normal location.   Penis: Circumcised, no warts, no cracks. No dorsal Peyronie's plaques, no left corporal Peyronie's plaques, no right corporal Peyronie's plaques, no scarring, no warts. No balanitis, no meatal stenosis.   MULTI-SYSTEM PHYSICAL EXAMINATION:    Constitutional: Well-nourished. No physical deformities. Normally developed. Good grooming.  Neck: Neck symmetrical, not swollen. Normal tracheal position.  Respiratory: No labored breathing, no use of accessory muscles.   Cardiovascular: Normal temperature, normal extremity pulses, no swelling, no varicosities.  Lymphatic: No enlargement of neck, axillae, groin.  Skin: No paleness, no jaundice, no cyanosis. No lesion, no ulcer, no rash.  Neurologic / Psychiatric: Oriented to time, oriented to place, oriented to person. No depression, no anxiety, no agitation.  Gastrointestinal: No mass, no tenderness, no rigidity, non obese abdomen.  Eyes: Normal conjunctivae. Normal eyelids.  Ears, Nose, Mouth, and Throat: Left ear no scars, no lesions, no masses. Right ear no scars, no lesions, no masses. Nose no scars, no lesions, no masses. Normal hearing. Normal lips.  Musculoskeletal:  Normal gait and station of head and neck.     Complexity of Data:  Lab Test Review:   BUN/Creatinine  Records Review:   Previous Patient Records, POC Tool  Notes:                     Review of his POC tool reveals a normal creatinine of 1.04 on 08/10/19.   PROCEDURES:         Flexible Cystoscopy - 52000  Risks, benefits, and some of the potential complications were discussed. Sterile technique and 2% Lidocaine intraurethral analgesia were used.  Meatus:  Normal size. Normal location. Normal condition.  Urethra:  No strictures.  External Sphincter:  Normal.  Verumontanum:  Normal.  Prostate:  Obstructing. Moderate hyperplasia.  Bladder Neck:  Non-obstructing.  Ureteral Orifices:  Normal location. Normal size. Normal shape. Effluxed clear urine.  Bladder:  I found an area on the right wall of his bladder that appeared somewhat concerning for possible urothelial malignancy.      The lower urinary tract was carefully examined. The procedure was well-tolerated and without complications. Instructions were given to call the office immediately for bloody urine, difficulty urinating, urinary retention, painful or frequent urination, fever or other illness. The patient stated that he understood these instructions and  would comply with them.         Urinalysis w/Scope Dipstick Dipstick Cont'd Micro  Color: Yellow Bilirubin: Neg mg/dL WBC/hpf: 0 - 5/hpf  Appearance: Clear Ketones: Neg mg/dL RBC/hpf: 3 - 10/hpf  Specific Gravity: 1.015 Blood: 1+ ery/uL Bacteria: NS (Not Seen)  pH: 7.5 Protein: Trace mg/dL Cystals: NS (Not Seen)  Glucose: Neg mg/dL Urobilinogen: 0.2 mg/dL Casts: NS (Not Seen)    Nitrites: Neg Trichomonas: Not Present    Leukocyte Esterase: Neg leu/uL Mucous: Not Present      Epithelial Cells: NS (Not Seen)      Yeast: NS (Not Seen)      Sperm: Not Present    ASSESSMENT/PLAN:      ICD-10 Details  1 GU:   Gross hematuria - R31.0 Chronic, Stable - He has not had any further  gross hematuria. The lesion that I noted on the right wall of his bladder certainly could have been the source of his hematuria. There was no active bleeding noted cystoscopically today.  2   Bladder tumor/neoplasm - D41.4 Undiagnosed New Problem - He has an area that is worrisome for urothelial malignancy on the right wall of his bladder. We therefore discussed the need for a bladder biopsy. I went over the procedure with him in detail. We discussed how the procedure is performed, its risks and complications, the probability of success, the outpatient nature of the procedure as well as the anticipated postoperative course. He understands and has elected to proceed.

## 2019-10-14 ENCOUNTER — Other Ambulatory Visit: Payer: Self-pay

## 2019-10-14 ENCOUNTER — Encounter (HOSPITAL_BASED_OUTPATIENT_CLINIC_OR_DEPARTMENT_OTHER): Payer: Self-pay | Admitting: Urology

## 2019-10-14 NOTE — Progress Notes (Signed)
Spoke w/ via phone for pre-op interview---pt Lab needs dos----    I stat 8           COVID test ------10-17-2019 at 255 pm Arrive at -------530 am 10-21-2019 NPO after ------midnight Medications to take morning of surgery -----diazepam prn, citalopram,  Diabetic medication -----n/a Patient Special Instructions -----bring cpap mask and tubing and leave in car Pre-Op special Istructions -----none Patient verbalized understanding of instructions that were given at this phone interview. Patient denies shortness of breath, chest pain, fever, cough a this phone interview.  Anesthesia : chf, cad and moderate aortic stenosis mild lvh, had right pci 11-2010, 4.6 cm ascending aortic aneurysm, mi 2012, osa with cpap, patient states no cardiac S&S, no sob with activity. Chart to jessica zanetto pa for review  PCP:dr steven frys Cardiologist :dr Barrie Lyme 07-04-2019 Chest ct; 12-09-2018 epic EKG :07-04-2019 Echo :07-04-2019 Cardiac Cath : 01-21-2016 epic Activity level: able to walk up flight of steps, do house hold activities Sleep Study/ CPAP :uses cpcp does not know settings or wherer sleep study done Fasting Blood Sugar :      / Checks Blood Sugar -- times a day:  n/a Blood Thinner/ Instructions /Last Dose:n/a ASA / Instructions/ Last Dose : patient instrcuted by dr Karsten Ro to stop 81 mg aspirin 5 days before surgery

## 2019-10-17 ENCOUNTER — Other Ambulatory Visit (HOSPITAL_COMMUNITY)
Admission: RE | Admit: 2019-10-17 | Discharge: 2019-10-17 | Disposition: A | Discharge status: Home / Self Care | Payer: PPO | Source: Ambulatory Visit | Attending: Urology | Admitting: Urology

## 2019-10-17 DIAGNOSIS — Z01812 Encounter for preprocedural laboratory examination: Secondary | ICD-10-CM | POA: Diagnosis not present

## 2019-10-17 DIAGNOSIS — Z20822 Contact with and (suspected) exposure to covid-19: Secondary | ICD-10-CM | POA: Insufficient documentation

## 2019-10-17 LAB — SARS CORONAVIRUS 2 (TAT 6-24 HRS): SARS Coronavirus 2: NEGATIVE

## 2019-10-18 NOTE — Progress Notes (Signed)
Chart reviewed by anesthesia, Konrad Felix PA and Dr Lissa Hoard MDA/ Dr Christella Hartigan MDA, barring any acute status change okay for [facility] (refre to [place] progress note in epic)

## 2019-10-18 NOTE — Progress Notes (Signed)
Anesthesia Chart Review   Case: [id number] Date/Time: 10/21/19 0715   Procedure: TRANSURETHRAL RESECTION OF BLADDER TUMOR (TURBT) (N/A )   Anesthesia type: General   Pre-op diagnosis: BLADDER LESION   Location: [facility] OR ROOM 2 / [place]   Surgeons: Kathie Rhodes, MD      DISCUSSION:63 y.o. former smoker (80 pack years, quit 04/25/10) with h/o moderate AS (mean gradient 36.0 mmHg, AV area 1.46 cm2), chronic diastolic CHF, CAD (DES 9563), GERD, sleep apnea w/CPAP, bladder lesion scheduled for above procedure 10/21/2019 with Dr. Kathie Rhodes.   Pt last seen by cardiology 07/04/2019.  Per OV note pt stable.  Walks his dogs 15-20 minutes a day, gets short of breath walking up hills.  6 month follow up recommended.    Discussed with Dr. Christella Hartigan and Dr. Lissa Hoard.  Anticipate pt can proceed with planned procedure barring acute status change, ok for [facility].  VS: Ht 5\' 6"  (1.676 m)   Wt 123.4 kg   BMI 43.90 kg/m   PROVIDERS: Laurey Morale, MD is PCP   [name], MD is Cardiologist  LABS: labs DOS, SDW (all labs ordered are listed, but only abnormal results are displayed)  Labs Reviewed - No data to display   IMAGES:   EKG: 07/04/2019 Rate 57 bpm Sinus bradycardia Left axis deviation  Poor R wave progression  CV: Echo 12/07/2018 IMPRESSIONS    1. The left ventricle has normal systolic function with an ejection  fraction of 60-65%. The cavity size was normal. There is moderately  increased left ventricular wall thickness. Left ventricular diastolic  Doppler parameters are consistent with  pseudonormalization.  2. The right ventricle has normal systolic function. The cavity was  mildly enlarged.  3. The mitral valve is abnormal. There is mild mitral annular  calcification present.  4. The tricuspid valve is grossly normal.  5. The aortic valve has an indeterminate number of cusps. Moderate  calcification of the aortic valve. Moderate stenosis of the aortic  valve.  6. The aorta is abnormal unless otherwise noted.  7. There is mild dilatation of the ascending aorta measuring 45 mm.  8. The inferior vena cava was dilated in size with >50% respiratory  variability.  9. Normal LV systolic function; moderate LVH; moderate diastolic  dysfunction; mildly dilated ascending aorta (4.5 cm; suggest CTA or MRA to  further assess); moderate AS (mean gradient 36 mmHg); mild RVE.   Cardiac Cath 01/21/2016 1. Mildly elevated left heart filling pressure (PCWP 18), normal RA pressure.  No pulmonary hypertension.  2. Coronary angiography was similar to the prior study.  There is a 60% stenosis in the proximal PDA.    I do not think that the moderate PDA disease should be causing his profound dyspnea with exertion.  There was no PDA territory ischemia on Cardiolite.  I will let him try ranolazine 500 mg bid for 1 month.  If symptoms improve, possible he does have an anginal equivalent with dyspnea.  Otherwise, could stop ranolazine.   Myocardial Perfusion 12/18/2015  Nuclear stress EF: 56%.  There was no ST segment deviation noted during stress.  No T wave inversion was noted during stress.  The study is normal.  This is a low risk study.  The left ventricular ejection fraction is normal (55-65%).   Low risk stress nuclear study with normal perfusion and normal left ventricular regional and global systolic function.  Past Medical History:  Diagnosis Date  . Anxiety    takes  Valium as needed  . Aortic stenosis, moderate   . Arthritis   . Ascending aortic aneurysm ([place])   . CAD (coronary artery disease)    a. s/p PCI of the RCA 8/12 with DES by Dr Burt Knack, preserved EF. b. LHC/RHC (2/16) with mean RA 12, PA 32/15, mean PCWP 18, CI 3.47; patent mid and distal RCA stents, 50-60% proximal stenosis small PDA.   . Carotid stenosis    a. Carotid US (05/2013):  Bilateral 1-39% ICA; L thyroid nodule (prior hx of aspiration).  . Chronic diastolic CHF  (congestive heart failure) ([place])   . Depression   . Essential hypertension   . GERD (gastroesophageal reflux disease)    if needed will take OTC meds   . Heart murmur   . History of colonic polyps    hyperplastic  . Hyperlipidemia   . Joint pain   . Lesion of bladder   . Myocardial infarction ([place]) 2012  . Obesity (BMI 30-39.9) 02/29/2016  . Restless leg   . Sleep apnea    uses cpap  . Tubular adenoma of colon   . Vertigo    takes Meclizine as needed    Past Surgical History:  Procedure Laterality Date  . CATARACT EXTRACTION   4 YRS AGO   BOTH EYES  . CHOLECYSTECTOMY  07/21/2011   Procedure: LAPAROSCOPIC CHOLECYSTECTOMY WITH INTRAOPERATIVE CHOLANGIOGRAM;  Surgeon: Shann Medal, MD;  Location: [facility] ORS;  Service: General;  Laterality: N/A;  . CORONARY ANGIOPLASTY  2012   2 stents  . coronary stenting     s/p PCI of the RCA by Dr Burt Knack 8/12 with 2 promus stents  . LEFT AND RIGHT HEART CATHETERIZATION WITH CORONARY ANGIOGRAM N/A 06/23/2014   Procedure: LEFT AND RIGHT HEART CATHETERIZATION WITH CORONARY ANGIOGRAM;  Surgeon: Larey Dresser, MD;  Location: [facility] C[facility] LAB;  Service: Cardiovascular;  Laterality: N/A;  . NECK SURGERY  03/23/09   per Dr. Lorin Mercy, cervical fusion   . right elbow surgery    . solonscopy  05/23/08   per Dr. Wynona Luna hemorrhoids only, repeat in 5 years  . TEE WITHOUT CARDIOVERSION N/A 06/23/2014   Procedure: TRANSESOPHAGEAL ECHOCARDIOGRAM (TEE);  Surgeon: Larey Dresser, MD;  Location: [facility];  Service: Cardiovascular;  Laterality: N/A;  . TEE WITHOUT CARDIOVERSION N/A 01/21/2016   Procedure: TRANSESOPHAGEAL ECHOCARDIOGRAM (TEE);  Surgeon: Larey Dresser, MD;  Location: [facility];  Service: Cardiovascular;  Laterality: N/A;  . TONSILLECTOMY      MEDICATIONS: No current facility-administered medications for this encounter.   [name] aspirin 81 MG tablet  . citalopram (CELEXA) 40 MG tablet  . diazepam (VALIUM) 5 MG tablet  . furosemide  (LASIX) 40 MG tablet  . ibuprofen (ADVIL,MOTRIN) 200 MG tablet  . ipratropium (ATROVENT) 0.03 % nasal spray  . losartan (COZAAR) 50 MG tablet  . MAGNESIUM PO  . meclizine (ANTIVERT) 25 MG tablet  . nitroGLYCERIN (NITROSTAT) 0.4 MG SL tablet  . rosuvastatin (CRESTOR) 20 MG tablet     Maia Plan [facility] Pre-Surgical Testing [phone] 10/18/19  2:59 PM

## 2019-10-20 ENCOUNTER — Encounter (HOSPITAL_BASED_OUTPATIENT_CLINIC_OR_DEPARTMENT_OTHER): Payer: Self-pay | Admitting: Urology

## 2019-10-20 NOTE — Anesthesia Preprocedure Evaluation (Addendum)
Anesthesia Evaluation  Patient identified by MRN, date of birth, ID band Patient awake    Reviewed: Allergy & Precautions, NPO status , Patient's Chart, lab work & pertinent test results, reviewed documented beta blocker date and time   Airway Mallampati: III  TM Distance: >3 FB Neck ROM: Full    Dental  (+) Edentulous Lower, Partial Upper   Pulmonary sleep apnea and Continuous Positive Airway Pressure Ventilation , former smoker,    Pulmonary exam normal breath sounds clear to auscultation       Cardiovascular hypertension, Pt. on medications + angina with exertion + CAD, + Past MI and +CHF  Normal cardiovascular exam+ Valvular Problems/Murmurs AS  Rhythm:Regular Rate:Normal  Moderate AS AVA 1.46cm2  Mean gradient 36 PCI 8/12 RCA w/ DES Inferior MI with preserved EF  Echo 11/27/18 1. The left ventricle has normal systolic function with an ejection fraction of 60-65%. The cavity size was normal. There is moderately increased left ventricular wall thickness. Left ventricular diastolic Doppler parameters are consistent with pseudonormalization.  2. The right ventricle has normal systolic function. The cavity was mildly enlarged.  3. The mitral valve is abnormal. There is mild mitral annular calcification present.  4. The tricuspid valve is grossly normal.  5. The aortic valve has an indeterminate number of cusps. Moderate calcification of the aortic valve. Moderate stenosis of the aortic valve.  6. The aorta is abnormal unless otherwise noted.  7. There is mild dilatation of the ascending aorta measuring 45 mm.  8. The inferior vena cava was dilated in size with >50% respiratory variability.  9. Normal LV systolic function; moderate LVH; moderate diastolic dysfunction; mildly dilated ascending aorta (4.5 cm; suggest CTA or MRA to further assess); moderate AS (mean gradient 36 mmHg); mild RVE.    Neuro/Psych PSYCHIATRIC  DISORDERS Anxiety Depression negative neurological ROS     GI/Hepatic Neg liver ROS, GERD  Medicated and Controlled,  Endo/Other  Hyperlipidemia  Renal/GU negative Renal ROS   Bladder lesion    Musculoskeletal  (+) Arthritis , Osteoarthritis,    Abdominal (+) + obese,   Peds  Hematology negative hematology ROS (+)   Anesthesia Other Findings   Reproductive/Obstetrics                          Anesthesia Physical Anesthesia Plan  ASA: III  Anesthesia Plan: General   Post-op Pain Management:    Induction: Intravenous  PONV Risk Score and Plan: 4 or greater and Ondansetron, Dexamethasone, Treatment may vary due to age or medical condition and Midazolam  Airway Management Planned: LMA  Additional Equipment:   Intra-op Plan:   Post-operative Plan: Extubation in OR  Informed Consent: I have reviewed the patients History and Physical, chart, labs and discussed the procedure including the risks, benefits and alternatives for the proposed anesthesia with the patient or authorized representative who has indicated his/her understanding and acceptance.     Dental advisory given  Plan Discussed with: CRNA and Surgeon  Anesthesia Plan Comments:        Anesthesia Quick Evaluation

## 2019-10-21 ENCOUNTER — Encounter (HOSPITAL_BASED_OUTPATIENT_CLINIC_OR_DEPARTMENT_OTHER)
Admission: RE | Disposition: A | Discharge status: Home / Self Care | Payer: Self-pay | Source: Home / Self Care | Attending: Urology

## 2019-10-21 ENCOUNTER — Encounter (HOSPITAL_BASED_OUTPATIENT_CLINIC_OR_DEPARTMENT_OTHER): Payer: Self-pay | Admitting: Urology

## 2019-10-21 ENCOUNTER — Other Ambulatory Visit: Payer: Self-pay

## 2019-10-21 ENCOUNTER — Ambulatory Visit (HOSPITAL_BASED_OUTPATIENT_CLINIC_OR_DEPARTMENT_OTHER)
Admission: RE | Admit: 2019-10-21 | Discharge: 2019-10-21 | Disposition: A | Discharge status: Home / Self Care | Payer: PPO | Attending: Urology | Admitting: Urology

## 2019-10-21 ENCOUNTER — Ambulatory Visit (HOSPITAL_BASED_OUTPATIENT_CLINIC_OR_DEPARTMENT_OTHER): Payer: PPO | Admitting: Physician Assistant

## 2019-10-21 DIAGNOSIS — Z79899 Other long term (current) drug therapy: Secondary | ICD-10-CM | POA: Insufficient documentation

## 2019-10-21 DIAGNOSIS — M1990 Unspecified osteoarthritis, unspecified site: Secondary | ICD-10-CM | POA: Insufficient documentation

## 2019-10-21 DIAGNOSIS — C672 Malignant neoplasm of lateral wall of bladder: Secondary | ICD-10-CM | POA: Diagnosis not present

## 2019-10-21 DIAGNOSIS — Z87891 Personal history of nicotine dependence: Secondary | ICD-10-CM | POA: Diagnosis not present

## 2019-10-21 DIAGNOSIS — I110 Hypertensive heart disease with heart failure: Secondary | ICD-10-CM | POA: Insufficient documentation

## 2019-10-21 DIAGNOSIS — Z955 Presence of coronary angioplasty implant and graft: Secondary | ICD-10-CM | POA: Insufficient documentation

## 2019-10-21 DIAGNOSIS — G4730 Sleep apnea, unspecified: Secondary | ICD-10-CM | POA: Diagnosis not present

## 2019-10-21 DIAGNOSIS — E7800 Pure hypercholesterolemia, unspecified: Secondary | ICD-10-CM | POA: Insufficient documentation

## 2019-10-21 DIAGNOSIS — I252 Old myocardial infarction: Secondary | ICD-10-CM | POA: Insufficient documentation

## 2019-10-21 DIAGNOSIS — F329 Major depressive disorder, single episode, unspecified: Secondary | ICD-10-CM | POA: Insufficient documentation

## 2019-10-21 DIAGNOSIS — I509 Heart failure, unspecified: Secondary | ICD-10-CM | POA: Diagnosis not present

## 2019-10-21 DIAGNOSIS — E785 Hyperlipidemia, unspecified: Secondary | ICD-10-CM | POA: Diagnosis not present

## 2019-10-21 DIAGNOSIS — Z7982 Long term (current) use of aspirin: Secondary | ICD-10-CM | POA: Insufficient documentation

## 2019-10-21 DIAGNOSIS — D494 Neoplasm of unspecified behavior of bladder: Secondary | ICD-10-CM | POA: Diagnosis not present

## 2019-10-21 DIAGNOSIS — C679 Malignant neoplasm of bladder, unspecified: Secondary | ICD-10-CM | POA: Diagnosis not present

## 2019-10-21 DIAGNOSIS — I5032 Chronic diastolic (congestive) heart failure: Secondary | ICD-10-CM | POA: Diagnosis not present

## 2019-10-21 DIAGNOSIS — N329 Bladder disorder, unspecified: Secondary | ICD-10-CM

## 2019-10-21 HISTORY — DX: Bladder disorder, unspecified: N32.9

## 2019-10-21 HISTORY — PX: TRANSURETHRAL RESECTION OF BLADDER TUMOR: SHX2575

## 2019-10-21 LAB — POCT I-STAT, CHEM 8
BUN: 23 mg/dL (ref 8–23)
Calcium, Ion: 1.21 mmol/L (ref 1.15–1.40)
Chloride: 102 mmol/L (ref 98–111)
Creatinine, Ser: 1.2 mg/dL (ref 0.61–1.24)
Glucose, Bld: 116 mg/dL — ABNORMAL HIGH (ref 70–99)
HCT: 39 % (ref 39.0–52.0)
Hemoglobin: 13.3 g/dL (ref 13.0–17.0)
Potassium: 3.7 mmol/L (ref 3.5–5.1)
Sodium: 141 mmol/L (ref 135–145)
TCO2: 29 mmol/L (ref 22–32)

## 2019-10-21 SURGERY — TURBT (TRANSURETHRAL RESECTION OF BLADDER TUMOR)
Anesthesia: General | Site: Bladder

## 2019-10-21 MED ORDER — ONDANSETRON HCL 4 MG/2ML IJ SOLN: 4.0000 mg | Freq: Once | INTRAMUSCULAR | Status: DC | PRN

## 2019-10-21 MED ORDER — PHENYLEPHRINE 40 MCG/ML (10ML) SYRINGE FOR IV PUSH (FOR BLOOD PRESSURE SUPPORT)
PREFILLED_SYRINGE | INTRAVENOUS | Status: DC | PRN
  Administered 2019-10-21: 120 ug via INTRAVENOUS

## 2019-10-21 MED ORDER — OXYCODONE HCL 5 MG/5ML PO SOLN: 5.0000 mg | Freq: Once | ORAL | Status: DC | PRN

## 2019-10-21 MED ORDER — PROPOFOL 10 MG/ML IV BOLUS
INTRAVENOUS | Status: AC
  Filled 2019-10-21: qty 20

## 2019-10-21 MED ORDER — ONDANSETRON HCL 4 MG/2ML IJ SOLN
INTRAMUSCULAR | Status: DC | PRN
  Administered 2019-10-21: 4 mg via INTRAVENOUS

## 2019-10-21 MED ORDER — EPHEDRINE SULFATE-NACL 50-0.9 MG/10ML-% IV SOSY
PREFILLED_SYRINGE | INTRAVENOUS | Status: DC | PRN
  Administered 2019-10-21: 10 mg via INTRAVENOUS

## 2019-10-21 MED ORDER — FENTANYL CITRATE (PF) 100 MCG/2ML IJ SOLN
INTRAMUSCULAR | Status: DC | PRN
  Administered 2019-10-21: 50 ug via INTRAVENOUS
  Administered 2019-10-21 (×2): 25 ug via INTRAVENOUS

## 2019-10-21 MED ORDER — FENTANYL CITRATE (PF) 100 MCG/2ML IJ SOLN
25.0000 ug | INTRAMUSCULAR | Status: DC | PRN
  Administered 2019-10-21 (×2): 25 ug via INTRAVENOUS
  Administered 2019-10-21: 50 ug via INTRAVENOUS

## 2019-10-21 MED ORDER — EPHEDRINE 5 MG/ML INJ
INTRAVENOUS | Status: AC
  Filled 2019-10-21: qty 10

## 2019-10-21 MED ORDER — GEMCITABINE CHEMO FOR BLADDER INSTILLATION 2000 MG
2000.0000 mg | Freq: Once | INTRAVENOUS | Status: AC
  Administered 2019-10-21: 2000 mg via INTRAVESICAL
  Filled 2019-10-21: qty 2000

## 2019-10-21 MED ORDER — SODIUM CHLORIDE 0.9 % IR SOLN
Status: DC | PRN
  Administered 2019-10-21: 3000 mL via INTRAVESICAL

## 2019-10-21 MED ORDER — ONDANSETRON HCL 4 MG/2ML IJ SOLN
INTRAMUSCULAR | Status: AC
  Filled 2019-10-21: qty 2

## 2019-10-21 MED ORDER — LACTATED RINGERS IV SOLN: INTRAVENOUS | Status: DC

## 2019-10-21 MED ORDER — LIDOCAINE 2% (20 MG/ML) 5 ML SYRINGE
INTRAMUSCULAR | Status: AC
  Filled 2019-10-21: qty 5

## 2019-10-21 MED ORDER — DEXAMETHASONE SODIUM PHOSPHATE 10 MG/ML IJ SOLN
INTRAMUSCULAR | Status: AC
  Filled 2019-10-21: qty 1

## 2019-10-21 MED ORDER — MIDAZOLAM HCL 5 MG/5ML IJ SOLN
INTRAMUSCULAR | Status: DC | PRN
  Administered 2019-10-21: 2 mg via INTRAVENOUS

## 2019-10-21 MED ORDER — OXYCODONE HCL 5 MG PO TABS: 5.0000 mg | ORAL_TABLET | Freq: Once | ORAL | Status: DC | PRN

## 2019-10-21 MED ORDER — PROPOFOL 10 MG/ML IV BOLUS
INTRAVENOUS | Status: DC | PRN
  Administered 2019-10-21 (×3): 50 mg via INTRAVENOUS
  Administered 2019-10-21: 200 mg via INTRAVENOUS
  Administered 2019-10-21: 50 mg via INTRAVENOUS

## 2019-10-21 MED ORDER — FENTANYL CITRATE (PF) 100 MCG/2ML IJ SOLN
INTRAMUSCULAR | Status: AC
  Filled 2019-10-21: qty 2

## 2019-10-21 MED ORDER — CIPROFLOXACIN IN D5W 400 MG/200ML IV SOLN
400.0000 mg | Freq: Once | INTRAVENOUS | Status: AC
  Administered 2019-10-21: 400 mg via INTRAVENOUS

## 2019-10-21 MED ORDER — PROPOFOL 10 MG/ML IV BOLUS
INTRAVENOUS | Status: AC
  Filled 2019-10-21: qty 40

## 2019-10-21 MED ORDER — MIDAZOLAM HCL 2 MG/2ML IJ SOLN
INTRAMUSCULAR | Status: AC
  Filled 2019-10-21: qty 2

## 2019-10-21 MED ORDER — DEXAMETHASONE SODIUM PHOSPHATE 10 MG/ML IJ SOLN
INTRAMUSCULAR | Status: DC | PRN
  Administered 2019-10-21: 10 mg via INTRAVENOUS

## 2019-10-21 MED ORDER — CIPROFLOXACIN IN D5W 400 MG/200ML IV SOLN
INTRAVENOUS | Status: AC
  Filled 2019-10-21: qty 200

## 2019-10-21 MED ORDER — HYDROCODONE-ACETAMINOPHEN 10-325 MG PO TABS: 1.0000 | ORAL_TABLET | ORAL | 0 refills | Status: DC | PRN

## 2019-10-21 MED ORDER — PHENAZOPYRIDINE HCL 200 MG PO TABS
200.0000 mg | ORAL_TABLET | Freq: Three times a day (TID) | ORAL | 0 refills | Status: DC | PRN
Start: 2019-10-21 — End: 2020-10-08

## 2019-10-21 MED ORDER — LIDOCAINE 2% (20 MG/ML) 5 ML SYRINGE
INTRAMUSCULAR | Status: DC | PRN
  Administered 2019-10-21: 100 mg via INTRAVENOUS

## 2019-10-21 MED ORDER — PHENYLEPHRINE 40 MCG/ML (10ML) SYRINGE FOR IV PUSH (FOR BLOOD PRESSURE SUPPORT)
PREFILLED_SYRINGE | INTRAVENOUS | Status: AC
  Filled 2019-10-21: qty 10

## 2019-10-21 SURGICAL SUPPLY — 29 items
BAG DRAIN URO-CYSTO SKYTR STRL (DRAIN) ×2 IMPLANT
BAG URINE DRAIN 2000ML AR STRL (UROLOGICAL SUPPLIES) IMPLANT
BAG URINE LEG 500ML (DRAIN) IMPLANT
CATH FOLEY 2WAY SLVR  5CC 16FR (CATHETERS) ×2
CATH FOLEY 2WAY SLVR  5CC 20FR (CATHETERS)
CATH FOLEY 2WAY SLVR  5CC 22FR (CATHETERS)
CATH FOLEY 2WAY SLVR  5CC 24FR (CATHETERS) ×2
CATH FOLEY 2WAY SLVR 5CC 16FR (CATHETERS) ×1 IMPLANT
CATH FOLEY 2WAY SLVR 5CC 20FR (CATHETERS) IMPLANT
CATH FOLEY 2WAY SLVR 5CC 22FR (CATHETERS) IMPLANT
CATH FOLEY 2WAY SLVR 5CC 24FR (CATHETERS) ×1 IMPLANT
CATH FOLEY 3WAY 20FR (CATHETERS) IMPLANT
CLOTH BEACON ORANGE TIMEOUT ST (SAFETY) ×2 IMPLANT
ELECT REM PT RETURN 9FT ADLT (ELECTROSURGICAL)
ELECTRODE REM PT RTRN 9FT ADLT (ELECTROSURGICAL) IMPLANT
EVACUATOR MICROVAS BLADDER (UROLOGICAL SUPPLIES) ×2 IMPLANT
GLOVE BIO SURGEON STRL SZ8 (GLOVE) ×2 IMPLANT
GOWN STRL REUS W/ TWL XL LVL3 (GOWN DISPOSABLE) ×1 IMPLANT
GOWN STRL REUS W/TWL XL LVL3 (GOWN DISPOSABLE) ×2
HOLDER FOLEY CATH W/STRAP (MISCELLANEOUS) ×2 IMPLANT
IV NS IRRIG 3000ML ARTHROMATIC (IV SOLUTION) ×2 IMPLANT
KIT TURNOVER CYSTO (KITS) ×2 IMPLANT
LOOP CUT BIPOLAR 24F LRG (ELECTROSURGICAL) ×2 IMPLANT
MANIFOLD NEPTUNE II (INSTRUMENTS) ×2 IMPLANT
PACK CYSTO (CUSTOM PROCEDURE TRAY) ×2 IMPLANT
PLUG CATH AND CAP STER (CATHETERS) IMPLANT
TUBE CONNECTING 12X1/4 (SUCTIONS) ×2 IMPLANT
TUBING UROLOGY SET (TUBING) ×2 IMPLANT
WATER STERILE IRR 3000ML UROMA (IV SOLUTION) IMPLANT

## 2019-10-21 NOTE — Anesthesia Postprocedure Evaluation (Signed)
Anesthesia Post Note  Patient: Luis [name]  Procedure(s) Performed: TRANSURETHRAL RESECTION OF BLADDER TUMOR (TURBT) (N/A Bladder)     Patient location during evaluation: PACU Anesthesia Type: General Level of consciousness: awake and alert and oriented Pain management: pain level controlled Vital Signs Assessment: post-procedure vital signs reviewed and stable Respiratory status: spontaneous breathing, nonlabored ventilation and respiratory function stable Cardiovascular status: blood pressure returned to baseline and stable Postop Assessment: no apparent nausea or vomiting Anesthetic complications: no   No complications documented.  Last Vitals:  Vitals:   10/21/19 0915 10/21/19 0930  BP: 109/81 113/78  Pulse: 63 (!) 59  Resp: 10 (!) 9  Temp:    SpO2: 100% 96%    Last Pain:  Vitals:   10/21/19 0845  TempSrc:   PainSc: 8                  Shalane Florendo A.

## 2019-10-21 NOTE — Discharge Instructions (Addendum)
Transurethral Resection/biopsy of Bladder Lesion    General instructions:     Your recent bladder surgery requires very little post hospital care but some definite precautions.  Despite the fact that no skin incisions were used, the area around the bladder incisions are raw and covered with scabs to promote healing and prevent bleeding. Certain precautions are needed to insure that the scabs are not disturbed over the next 2-4 weeks while the healing proceeds.  Because the raw surface inside your bladder and the irritating effects of urine you may expect frequency of urination and/or urgency (a stronger desire to urinate) and perhaps even getting up at night more often. This will usually resolve or improve slowly over the healing period. You may see some blood in your urine over the first 6 weeks. Do not be alarmed, even if the urine was clear for a while. Get off your feet and drink lots of fluids until clearing occurs. If you start to pass clots or don't improve call us.  Catheter: (If you are discharged with a catheter.)  1. Keep your catheter secured to your leg at all times with tape or the supplied strap. 2. You may experience leakage of urine around your catheter- as long as the  catheter continues to drain, this is normal.  If your catheter stops draining  go to the ER. 3. You may also have blood in your urine, even after it has been clear for  several days; you may even pass some small blood clots or other material.  This  is normal as well.  If this happens, sit down and drink plenty of water to help  make urine to flush out your bladder.  If the blood in your urine becomes worse  after doing this, contact our office or return to the ER. 4. You may use the leg bag (small bag) during the day, but use the large bag at  night.  Diet:  You may return to your normal diet immediately. Because of the raw surface of your bladder, alcohol, spicy foods, foods high in acid and drinks with  caffeine may cause irritation or frequency and should be used in moderation. To keep your urine flowing freely and avoid constipation, drink plenty of fluids during the day (8-10 glasses). Tip: Avoid cranberry juice because it is very acidic.  Activity:  Your physical activity doesn't need to be restricted. However, if you are very active, you may see some blood in the urine. We suggest that you reduce your activity under the circumstances until the bleeding has stopped.  Bowels:  It is important to keep your bowels regular during the postoperative period. Straining with bowel movements can cause bleeding. A bowel movement every other day is reasonable. Use a mild laxative if needed, such as milk of magnesia 2-3 tablespoons, or 2 Dulcolax tablets. Call if you continue to have problems. If you had been taking narcotics for pain, before, during or after your surgery, you may be constipated. Take a laxative if necessary.    Medication:  You should resume your pre-surgery medications unless told not to. In addition you may be given an antibiotic to prevent or treat infection. Antibiotics are not always necessary. All medication should be taken as prescribed until the bottles are finished unless you are having an unusual reaction to one of the drugs.   Post Anesthesia Home Care Instructions  Activity: Get plenty of rest for the remainder of the day. A responsible adult should stay with  you for 24 hours following the procedure.  For the next 24 hours, DO NOT: -Drive a car -Paediatric nurse -Drink alcoholic beverages -Take any medication unless instructed by your physician -Make any legal decisions or sign important papers.  Meals: Start with liquid foods such as gelatin or soup. Progress to regular foods as tolerated. Avoid greasy, spicy, heavy foods. If nausea and/or vomiting occur, drink only clear liquids until the nausea and/or vomiting subsides. Call your physician if vomiting  continues.  Special Instructions/Symptoms: Your throat may feel dry or sore from the anesthesia or the breathing tube placed in your throat during surgery. If this causes discomfort, gargle with warm salt water. The discomfort should disappear within 24 hours.  If you had a scopolamine patch placed behind your ear for the management of post- operative nausea and/or vomiting:  1. The medication in the patch is effective for 72 hours, after which it should be removed.  Wrap patch in a tissue and discard in the trash. Wash hands thoroughly with soap and water. 2. You may remove the patch earlier than 72 hours if you experience unpleasant side effects which may include dry mouth, dizziness or visual disturbances. 3. Avoid touching the patch. Wash your hands with soap and water after contact with the patch.

## 2019-10-21 NOTE — Anesthesia Procedure Notes (Signed)
Procedure Name: LMA Insertion Date/Time: 10/21/2019 7:22 AM Performed by: Lollie Sails, CRNA Pre-anesthesia Checklist: Patient identified, Emergency Drugs available, Suction available, Patient being monitored and Timeout performed Patient Re-evaluated:Patient Re-evaluated prior to induction Oxygen Delivery Method: Circle system utilized Preoxygenation: Pre-oxygenation with 100% oxygen Induction Type: IV induction Ventilation: Mask ventilation without difficulty LMA: LMA inserted LMA Size: 5.0 Number of attempts: 1 Placement Confirmation: positive ETCO2 and breath sounds checked- equal and bilateral Tube secured with: Tape Dental Injury: Teeth and Oropharynx as per pre-operative assessment

## 2019-10-21 NOTE — Op Note (Signed)
PATIENT:  Luis [name]  PRE-OPERATIVE DIAGNOSIS: Bladder tumor  POST-OPERATIVE DIAGNOSIS: Same  PROCEDURE:  Procedure(s): 1. TRANSURETHRAL RESECTION OF BLADDER TUMOR (TURBT) (1.0 cm.) 2. Instillation of intravesical chemotherapy (Gemcitabine)  SURGEON:  Surgeon(s): Claybon Jabs  ANESTHESIA:   General  EBL:  Minimal  DRAINS: Urethral catheter (16 Fr. Foley)   SPECIMEN:  Bladder tumor  DISPOSITION OF SPECIMEN:  PATHOLOGY  Indication: Luis [name] is a 64 year old male who experienced gross hematuria and therefore was evaluated further with a CT scan which revealed no abnormality of the upper tract.  Cystoscopy after having had a Foley catheter in for some time revealed what appeared to be some abnormal mucosa on the right wall of the bladder and therefore he is brought to the operating room today for further evaluation of that and a thorough inspection of his bladder under anesthesia.  Description of operation: The patient was taken to the operating room and administered general anesthesia. They were then placed on the table and moved to the dorsal lithotomy position after which the genitalia was sterilely prepped and draped. An official timeout was then performed.  The 5 French resectoscope with the 30 lens and visual obturator were then passed into the bladder under direct visualization. Urethra appeared normal.  He was noted to have a very high bladder neck with lateral lobe hypertrophy of the prostate but no significant median lobe hypertrophy.  The visual obturator was then removed and the Gyrus resectoscope element with 30  lens was then inserted and the bladder was fully and systematically inspected. Ureteral orifices were noted to be in the normal anatomic positions.  The right wall of the bladder was inspected and at this time found to be entirely normal in appearance.  I did however find a papillary tumor on the left wall of the bladder lateral to the left ureteral  orifice.  No other papillary tumors were identified in the bladder with the 30 degree lens so I inserted the 70 degree lens and reinspected the bladder again noting only the single bladder tumor on the left wall of the bladder with no right bladder wall mucosal abnormality noted.  I first began by resecting the tumor from the left wall of the bladder.  I resected the tumor and then resected into the wall of the bladder.  I then fulgurated the base and the surrounding mucosa.  The left ureteral orifice was noted to remain well away from the area of resection and fulguration.  Reinspection of the bladder revealed all obvious tumor had been fully resected and there was no evidence of perforation. The Microvasive evacuator was then used to irrigate the bladder and remove all of the portions of bladder tumor which were sent to pathology. I then removed the resectoscope.  A 16 French Foley catheter was then inserted in the bladder and irrigated. The irrigant returned clear with no clots. The patient was awakened and taken to the recovery room.  While in the recovery room 2 g of gemcitabine in 52.6 cc of sterile water was instilled in the bladder through the catheter and the catheter was plugged. This will remain indwelling for approximately one hour. It will then be drained from the bladder and the catheter will be removed and the patient discharged home.  PLAN OF CARE: Discharge to home after PACU  PATIENT DISPOSITION:  PACU - hemodynamically stable.

## 2019-10-21 NOTE — Transfer of Care (Signed)
Immediate Anesthesia Transfer of Care Note  Patient: OTHMAR RINGER  Procedure(s) Performed: TRANSURETHRAL RESECTION OF BLADDER TUMOR (TURBT) (N/A Bladder)  Patient Location: PACU  Anesthesia Type:General  Level of Consciousness: awake, sedated and patient cooperative  Airway & Oxygen Therapy: Patient Spontanous Breathing and Patient connected to nasal cannula oxygen  Post-op Assessment: Report given to RN and Post -op Vital signs reviewed and stable  Post vital signs: Reviewed and stable  Last Vitals:  Vitals Value Taken Time  BP 130/91 10/21/19 0810  Temp 36.8 C 10/21/19 0810  Pulse 65 10/21/19 0812  Resp 12 10/21/19 0812  SpO2 99 % 10/21/19 0812  Vitals shown include unvalidated device data.  Last Pain:  Vitals:   10/21/19 0545  TempSrc: Oral  PainSc: 0-No pain      Patients Stated Pain Goal: 4 (39/43/20 0379)  Complications: No complications documented.

## 2019-10-22 ENCOUNTER — Encounter (HOSPITAL_BASED_OUTPATIENT_CLINIC_OR_DEPARTMENT_OTHER): Payer: Self-pay | Admitting: Urology

## 2019-10-22 DIAGNOSIS — N401 Enlarged prostate with lower urinary tract symptoms: Secondary | ICD-10-CM | POA: Diagnosis not present

## 2019-10-22 DIAGNOSIS — D414 Neoplasm of uncertain behavior of bladder: Secondary | ICD-10-CM | POA: Diagnosis not present

## 2019-10-22 DIAGNOSIS — R3915 Urgency of urination: Secondary | ICD-10-CM | POA: Diagnosis not present

## 2019-10-22 LAB — SURGICAL PATHOLOGY

## 2019-10-24 DIAGNOSIS — D414 Neoplasm of uncertain behavior of bladder: Secondary | ICD-10-CM | POA: Diagnosis not present

## 2019-10-24 DIAGNOSIS — N401 Enlarged prostate with lower urinary tract symptoms: Secondary | ICD-10-CM | POA: Diagnosis not present

## 2019-10-24 DIAGNOSIS — R338 Other retention of urine: Secondary | ICD-10-CM | POA: Diagnosis not present

## 2019-10-25 ENCOUNTER — Other Ambulatory Visit: Payer: Self-pay | Admitting: Urology

## 2019-10-25 DIAGNOSIS — R338 Other retention of urine: Secondary | ICD-10-CM | POA: Diagnosis not present

## 2019-10-31 DIAGNOSIS — R338 Other retention of urine: Secondary | ICD-10-CM | POA: Diagnosis not present

## 2019-10-31 DIAGNOSIS — D414 Neoplasm of uncertain behavior of bladder: Secondary | ICD-10-CM | POA: Diagnosis not present

## 2019-11-19 ENCOUNTER — Other Ambulatory Visit: Payer: Self-pay | Admitting: Family Medicine

## 2019-11-20 NOTE — Telephone Encounter (Signed)
Last filled 05/22/2018 Last OV 08/08/2019  Ok to fill?

## 2019-12-02 ENCOUNTER — Other Ambulatory Visit: Payer: Self-pay | Admitting: Orthopedic Surgery

## 2019-12-02 DIAGNOSIS — C678 Malignant neoplasm of overlapping sites of bladder: Secondary | ICD-10-CM | POA: Diagnosis not present

## 2019-12-02 DIAGNOSIS — M25521 Pain in right elbow: Secondary | ICD-10-CM

## 2019-12-02 DIAGNOSIS — R338 Other retention of urine: Secondary | ICD-10-CM | POA: Diagnosis not present

## 2019-12-02 DIAGNOSIS — N281 Cyst of kidney, acquired: Secondary | ICD-10-CM | POA: Diagnosis not present

## 2019-12-02 DIAGNOSIS — Z125 Encounter for screening for malignant neoplasm of prostate: Secondary | ICD-10-CM | POA: Diagnosis not present

## 2019-12-03 ENCOUNTER — Ambulatory Visit
Admission: RE | Admit: 2019-12-03 | Discharge: 2019-12-03 | Disposition: A | Discharge status: Home / Self Care | Payer: PPO | Source: Ambulatory Visit | Attending: Orthopedic Surgery | Admitting: Orthopedic Surgery

## 2019-12-03 DIAGNOSIS — M19021 Primary osteoarthritis, right elbow: Secondary | ICD-10-CM | POA: Diagnosis not present

## 2019-12-03 DIAGNOSIS — M25421 Effusion, right elbow: Secondary | ICD-10-CM | POA: Diagnosis not present

## 2019-12-03 DIAGNOSIS — M25521 Pain in right elbow: Secondary | ICD-10-CM

## 2019-12-03 DIAGNOSIS — M24021 Loose body in right elbow: Secondary | ICD-10-CM | POA: Diagnosis not present

## 2019-12-03 DIAGNOSIS — Z9889 Other specified postprocedural states: Secondary | ICD-10-CM | POA: Diagnosis not present

## 2019-12-04 ENCOUNTER — Other Ambulatory Visit: Payer: Self-pay

## 2019-12-04 ENCOUNTER — Encounter (HOSPITAL_BASED_OUTPATIENT_CLINIC_OR_DEPARTMENT_OTHER): Payer: Self-pay | Admitting: Urology

## 2019-12-04 NOTE — Progress Notes (Signed)
Spoke w/ via phone for pre-op interview---PT Lab needs dos----   I stat 8             COVID test ------12-10-2019 at 1100 am Arrive at -------800 am 12-13-2019 NPO after MN NO Solid Food.  Clear liquids from MN until---700 am then npo Medications to take morning of surgery -----diazepam prn, citalopram Diabetic medication -----n/a Patient Special Instructions -----none Pre-Op special Istructions -----none Patient verbalized understanding of instructions that were given at this phone interview. Patient denies shortness of breath, chest pain, fever, cough at this phone interview.  Anesthesia Review: chart reviewed by Janett Billow zanetto pa and [place] spoke with dr germeroth and dr Christella Hartigan on 10-18-2019 see progress notes jessica zanetto pa and denise sexton rn 10-18-2019 barring acute status chnages pt ok for [facility].. had 10-21-2019 surgery at [facility] without any problems. Hx of chf, cad and moderate aortic stenosis, mild lvh, had right pci 11/2010, 4.6 cm ascending aortic aneurysm, mi 2012, osa uses cpap, patient states no cardiac s & s, no sob at pre op call  PCP: dr Nydia Bouton Cardiologist :de mclean lov 07-04-2019 epic Chest Ct: 12-19-2018 epic EKG :07-04-2019 epic Echo :07-04-2019 epic  Cardiac Cath : 01-21-2016 epic Activity level: able to walk up stairs and do household activities without problems Sleep Study/ CPAP : 06-22-2016 epic uses cpap Fasting Blood Sugar :      / Checks Blood Sugar -- times a day:  n/a Blood Thinner/ Instructions /Last Dose: n/a ASA / Instructions/ Last Dose : stopping 81 mg aspirin 5 days before surgery per dr Karsten Ro instructions

## 2019-12-05 DIAGNOSIS — M19029 Primary osteoarthritis, unspecified elbow: Secondary | ICD-10-CM | POA: Diagnosis not present

## 2019-12-05 DIAGNOSIS — M19021 Primary osteoarthritis, right elbow: Secondary | ICD-10-CM | POA: Diagnosis not present

## 2019-12-10 ENCOUNTER — Other Ambulatory Visit (HOSPITAL_COMMUNITY)
Admission: RE | Admit: 2019-12-10 | Discharge: 2019-12-10 | Disposition: A | Discharge status: Home / Self Care | Payer: PPO | Source: Ambulatory Visit | Attending: Urology | Admitting: Urology

## 2019-12-10 DIAGNOSIS — Z20822 Contact with and (suspected) exposure to covid-19: Secondary | ICD-10-CM | POA: Insufficient documentation

## 2019-12-10 DIAGNOSIS — Z01812 Encounter for preprocedural laboratory examination: Secondary | ICD-10-CM | POA: Diagnosis not present

## 2019-12-10 LAB — SARS CORONAVIRUS 2 (TAT 6-24 HRS): SARS Coronavirus 2: NEGATIVE

## 2019-12-12 NOTE — Anesthesia Preprocedure Evaluation (Addendum)
Anesthesia Evaluation  Patient identified by MRN, date of birth, ID band Patient awake    Reviewed: Allergy & Precautions, NPO status , Patient's Chart, lab work & pertinent test results  Airway Mallampati: II  TM Distance: >3 FB Neck ROM: Full   Comment: Thick beard  Dental  (+) Edentulous Lower, Partial Upper, Dental Advisory Given,    Pulmonary sleep apnea , Patient abstained from smoking., former smoker,  Quit smoking 2012, 80 pack year history    Pulmonary exam normal breath sounds clear to auscultation       Cardiovascular hypertension, Pt. on medications + CAD, + Past MI (2012), + Cardiac Stents (s/p PCI of the RCA 8/12 with DES, takes baby ASA) and +CHF (moderate diastolic dysfunction)  Normal cardiovascular exam+ Valvular Problems/Murmurs (mod AS) AS  Rhythm:Regular Rate:Normal  Echo 11/2018: Normal LV systolic function; moderate LVH; moderate diastolic  dysfunction; mildly dilated ascending aorta (4.5 cm; suggest CTA or MRA to  further assess); moderate AS (mean gradient 36 mmHg); mild RVE.    Neuro/Psych PSYCHIATRIC DISORDERS Anxiety Depression Vertigo, RLS    GI/Hepatic Neg liver ROS, GERD  ,  Endo/Other  Morbid obesityBMI 44   Renal/GU negative Renal ROS Bladder dysfunction  Bladder ca    Musculoskeletal  (+) Arthritis , Osteoarthritis,    Abdominal (+) + obese,   Peds  Hematology negative hematology ROS (+)   Anesthesia Other Findings   Reproductive/Obstetrics negative OB ROS                            Anesthesia Physical Anesthesia Plan  ASA: III  Anesthesia Plan: General   Post-op Pain Management:    Induction: Intravenous  PONV Risk Score and Plan: 3 and Ondansetron, Dexamethasone, Treatment may vary due to age or medical condition and Midazolam  Airway Management Planned: LMA  Additional Equipment: None  Intra-op Plan:   Post-operative Plan: Extubation in  OR  Informed Consent: I have reviewed the patients History and Physical, chart, labs and discussed the procedure including the risks, benefits and alternatives for the proposed anesthesia with the patient or authorized representative who has indicated his/her understanding and acceptance.     Dental advisory given  Plan Discussed with: CRNA  Anesthesia Plan Comments:        Anesthesia Quick Evaluation

## 2019-12-13 ENCOUNTER — Ambulatory Visit (HOSPITAL_BASED_OUTPATIENT_CLINIC_OR_DEPARTMENT_OTHER): Payer: PPO | Admitting: Anesthesiology

## 2019-12-13 ENCOUNTER — Encounter (HOSPITAL_BASED_OUTPATIENT_CLINIC_OR_DEPARTMENT_OTHER): Payer: Self-pay | Admitting: Urology

## 2019-12-13 ENCOUNTER — Ambulatory Visit (HOSPITAL_BASED_OUTPATIENT_CLINIC_OR_DEPARTMENT_OTHER)
Admission: RE | Admit: 2019-12-13 | Discharge: 2019-12-13 | Disposition: A | Discharge status: Home / Self Care | Payer: PPO | Attending: Urology | Admitting: Urology

## 2019-12-13 ENCOUNTER — Other Ambulatory Visit: Payer: Self-pay

## 2019-12-13 ENCOUNTER — Encounter (HOSPITAL_BASED_OUTPATIENT_CLINIC_OR_DEPARTMENT_OTHER)
Admission: RE | Disposition: A | Discharge status: Home / Self Care | Payer: Self-pay | Source: Home / Self Care | Attending: Urology

## 2019-12-13 DIAGNOSIS — Z7982 Long term (current) use of aspirin: Secondary | ICD-10-CM | POA: Insufficient documentation

## 2019-12-13 DIAGNOSIS — I110 Hypertensive heart disease with heart failure: Secondary | ICD-10-CM | POA: Diagnosis not present

## 2019-12-13 DIAGNOSIS — E669 Obesity, unspecified: Secondary | ICD-10-CM | POA: Insufficient documentation

## 2019-12-13 DIAGNOSIS — I252 Old myocardial infarction: Secondary | ICD-10-CM | POA: Diagnosis not present

## 2019-12-13 DIAGNOSIS — C679 Malignant neoplasm of bladder, unspecified: Secondary | ICD-10-CM | POA: Insufficient documentation

## 2019-12-13 DIAGNOSIS — I712 Thoracic aortic aneurysm, without rupture: Secondary | ICD-10-CM | POA: Insufficient documentation

## 2019-12-13 DIAGNOSIS — Z87891 Personal history of nicotine dependence: Secondary | ICD-10-CM | POA: Diagnosis not present

## 2019-12-13 DIAGNOSIS — D090 Carcinoma in situ of bladder: Secondary | ICD-10-CM | POA: Diagnosis not present

## 2019-12-13 DIAGNOSIS — I6529 Occlusion and stenosis of unspecified carotid artery: Secondary | ICD-10-CM | POA: Diagnosis not present

## 2019-12-13 DIAGNOSIS — R339 Retention of urine, unspecified: Secondary | ICD-10-CM | POA: Insufficient documentation

## 2019-12-13 DIAGNOSIS — G4730 Sleep apnea, unspecified: Secondary | ICD-10-CM | POA: Diagnosis not present

## 2019-12-13 DIAGNOSIS — F419 Anxiety disorder, unspecified: Secondary | ICD-10-CM | POA: Diagnosis not present

## 2019-12-13 DIAGNOSIS — Z79899 Other long term (current) drug therapy: Secondary | ICD-10-CM | POA: Diagnosis not present

## 2019-12-13 DIAGNOSIS — I2510 Atherosclerotic heart disease of native coronary artery without angina pectoris: Secondary | ICD-10-CM | POA: Diagnosis not present

## 2019-12-13 DIAGNOSIS — Z955 Presence of coronary angioplasty implant and graft: Secondary | ICD-10-CM | POA: Insufficient documentation

## 2019-12-13 DIAGNOSIS — R42 Dizziness and giddiness: Secondary | ICD-10-CM | POA: Insufficient documentation

## 2019-12-13 DIAGNOSIS — N3289 Other specified disorders of bladder: Secondary | ICD-10-CM | POA: Diagnosis not present

## 2019-12-13 DIAGNOSIS — I5032 Chronic diastolic (congestive) heart failure: Secondary | ICD-10-CM | POA: Diagnosis not present

## 2019-12-13 DIAGNOSIS — Z8551 Personal history of malignant neoplasm of bladder: Secondary | ICD-10-CM | POA: Diagnosis not present

## 2019-12-13 DIAGNOSIS — K219 Gastro-esophageal reflux disease without esophagitis: Secondary | ICD-10-CM | POA: Insufficient documentation

## 2019-12-13 HISTORY — PX: CYSTOSCOPY W/ RETROGRADES: SHX1426

## 2019-12-13 HISTORY — PX: TRANSURETHRAL RESECTION OF BLADDER TUMOR: SHX2575

## 2019-12-13 LAB — POCT I-STAT, CHEM 8
BUN: 25 mg/dL — ABNORMAL HIGH (ref 8–23)
Calcium, Ion: 1.18 mmol/L (ref 1.15–1.40)
Chloride: 101 mmol/L (ref 98–111)
Creatinine, Ser: 1 mg/dL (ref 0.61–1.24)
Glucose, Bld: 110 mg/dL — ABNORMAL HIGH (ref 70–99)
HCT: 40 % (ref 39.0–52.0)
Hemoglobin: 13.6 g/dL (ref 13.0–17.0)
Potassium: 4.6 mmol/L (ref 3.5–5.1)
Sodium: 139 mmol/L (ref 135–145)
TCO2: 28 mmol/L (ref 22–32)

## 2019-12-13 SURGERY — TURBT (TRANSURETHRAL RESECTION OF BLADDER TUMOR)
Anesthesia: General | Site: Bladder

## 2019-12-13 MED ORDER — EPHEDRINE SULFATE-NACL 50-0.9 MG/10ML-% IV SOSY
PREFILLED_SYRINGE | INTRAVENOUS | Status: DC | PRN
  Administered 2019-12-13 (×5): 10 mg via INTRAVENOUS

## 2019-12-13 MED ORDER — HYDROMORPHONE HCL 1 MG/ML IJ SOLN: 0.2500 mg | INTRAMUSCULAR | Status: DC | PRN

## 2019-12-13 MED ORDER — ONDANSETRON HCL 4 MG/2ML IJ SOLN
INTRAMUSCULAR | Status: DC | PRN
  Administered 2019-12-13: 4 mg via INTRAVENOUS

## 2019-12-13 MED ORDER — LIDOCAINE HCL (CARDIAC) PF 100 MG/5ML IV SOSY
PREFILLED_SYRINGE | INTRAVENOUS | Status: DC | PRN
  Administered 2019-12-13: 60 mg via INTRAVENOUS

## 2019-12-13 MED ORDER — EPHEDRINE 5 MG/ML INJ
INTRAVENOUS | Status: AC
  Filled 2019-12-13: qty 10

## 2019-12-13 MED ORDER — DEXAMETHASONE SODIUM PHOSPHATE 10 MG/ML IJ SOLN
INTRAMUSCULAR | Status: AC
  Filled 2019-12-13: qty 1

## 2019-12-13 MED ORDER — HYDROCODONE-ACETAMINOPHEN 10-325 MG PO TABS: 1.0000 | ORAL_TABLET | ORAL | 0 refills | Status: DC | PRN

## 2019-12-13 MED ORDER — FENTANYL CITRATE (PF) 100 MCG/2ML IJ SOLN
INTRAMUSCULAR | Status: DC | PRN
Start: 2019-12-13 — End: 2019-12-13
  Administered 2019-12-13: 25 ug via INTRAVENOUS
  Administered 2019-12-13: 50 ug via INTRAVENOUS
  Administered 2019-12-13: 25 ug via INTRAVENOUS

## 2019-12-13 MED ORDER — MIDAZOLAM HCL 5 MG/5ML IJ SOLN
INTRAMUSCULAR | Status: DC | PRN
  Administered 2019-12-13 (×2): 1 mg via INTRAVENOUS

## 2019-12-13 MED ORDER — ACETAMINOPHEN 500 MG PO TABS
ORAL_TABLET | ORAL | Status: AC
  Filled 2019-12-13: qty 2

## 2019-12-13 MED ORDER — PROPOFOL 10 MG/ML IV BOLUS
INTRAVENOUS | Status: DC | PRN
  Administered 2019-12-13: 200 mg via INTRAVENOUS

## 2019-12-13 MED ORDER — OXYCODONE HCL 5 MG/5ML PO SOLN: 5.0000 mg | Freq: Once | ORAL | Status: DC | PRN

## 2019-12-13 MED ORDER — IOHEXOL 300 MG/ML  SOLN
INTRAMUSCULAR | Status: DC | PRN
  Administered 2019-12-13: 17 mL via URETHRAL

## 2019-12-13 MED ORDER — PROPOFOL 10 MG/ML IV BOLUS
INTRAVENOUS | Status: AC
  Filled 2019-12-13: qty 20

## 2019-12-13 MED ORDER — MIDAZOLAM HCL 2 MG/2ML IJ SOLN
INTRAMUSCULAR | Status: AC
  Filled 2019-12-13: qty 2

## 2019-12-13 MED ORDER — LIDOCAINE 2% (20 MG/ML) 5 ML SYRINGE
INTRAMUSCULAR | Status: AC
  Filled 2019-12-13: qty 5

## 2019-12-13 MED ORDER — FENTANYL CITRATE (PF) 100 MCG/2ML IJ SOLN
INTRAMUSCULAR | Status: AC
  Filled 2019-12-13: qty 2

## 2019-12-13 MED ORDER — ONDANSETRON HCL 4 MG/2ML IJ SOLN
INTRAMUSCULAR | Status: AC
  Filled 2019-12-13: qty 2

## 2019-12-13 MED ORDER — OXYCODONE HCL 5 MG PO TABS: 5.0000 mg | ORAL_TABLET | Freq: Once | ORAL | Status: DC | PRN

## 2019-12-13 MED ORDER — GENTAMICIN SULFATE 40 MG/ML IJ SOLN
5.0000 mg/kg | INTRAVENOUS | Status: AC
  Administered 2019-12-13: 440 mg via INTRAVENOUS
  Filled 2019-12-13: qty 11

## 2019-12-13 MED ORDER — DEXAMETHASONE SODIUM PHOSPHATE 4 MG/ML IJ SOLN
INTRAMUSCULAR | Status: DC | PRN
  Administered 2019-12-13: 5 mg via INTRAVENOUS

## 2019-12-13 MED ORDER — ACETAMINOPHEN 500 MG PO TABS
1000.0000 mg | ORAL_TABLET | Freq: Once | ORAL | Status: AC
  Administered 2019-12-13: 1000 mg via ORAL

## 2019-12-13 MED ORDER — ONDANSETRON HCL 4 MG/2ML IJ SOLN: 4.0000 mg | Freq: Once | INTRAMUSCULAR | Status: DC | PRN

## 2019-12-13 MED ORDER — SODIUM CHLORIDE 0.9 % IR SOLN
Status: DC | PRN
  Administered 2019-12-13: 3000 mL via INTRAVESICAL

## 2019-12-13 MED ORDER — EPHEDRINE SULFATE 50 MG/ML IJ SOLN
INTRAMUSCULAR | Status: DC | PRN
  Administered 2019-12-13: 5 mg via INTRAVENOUS

## 2019-12-13 MED ORDER — LACTATED RINGERS IV SOLN: INTRAVENOUS | Status: DC

## 2019-12-13 SURGICAL SUPPLY — 34 items
BAG DRAIN URO-CYSTO SKYTR STRL (DRAIN) ×4 IMPLANT
BAG DRN RND TRDRP ANRFLXCHMBR (UROLOGICAL SUPPLIES)
BAG DRN UROCATH (DRAIN) ×2
BAG URINE DRAIN 2000ML AR STRL (UROLOGICAL SUPPLIES) IMPLANT
BAG URINE LEG 500ML (DRAIN) IMPLANT
CATH FOLEY 2WAY SLVR  5CC 22FR (CATHETERS)
CATH FOLEY 2WAY SLVR 30CC 20FR (CATHETERS) IMPLANT
CATH FOLEY 2WAY SLVR 5CC 22FR (CATHETERS) IMPLANT
CATH INTERMIT  6FR 70CM (CATHETERS) ×4 IMPLANT
CLOTH BEACON ORANGE TIMEOUT ST (SAFETY) ×4 IMPLANT
ELECT REM PT RETURN 9FT ADLT (ELECTROSURGICAL) ×4
ELECTRODE REM PT RTRN 9FT ADLT (ELECTROSURGICAL) ×2 IMPLANT
EVACUATOR MICROVAS BLADDER (UROLOGICAL SUPPLIES) IMPLANT
GAUZE SPONGE 4X4 12PLY STRL LF (GAUZE/BANDAGES/DRESSINGS) ×4 IMPLANT
GLOVE BIO SURGEON STRL SZ 6.5 (GLOVE) ×3 IMPLANT
GLOVE BIO SURGEON STRL SZ7.5 (GLOVE) ×4 IMPLANT
GLOVE BIO SURGEONS STRL SZ 6.5 (GLOVE) ×1
GOWN STRL REUS W/ TWL LRG LVL3 (GOWN DISPOSABLE) ×2 IMPLANT
GOWN STRL REUS W/TWL LRG LVL3 (GOWN DISPOSABLE) ×8 IMPLANT
GUIDEWIRE ANG ZIPWIRE 038X150 (WIRE) IMPLANT
GUIDEWIRE STR DUAL SENSOR (WIRE) ×4 IMPLANT
IV NS 1000ML (IV SOLUTION)
IV NS 1000ML BAXH (IV SOLUTION) IMPLANT
IV NS IRRIG 3000ML ARTHROMATIC (IV SOLUTION) ×4 IMPLANT
KIT TURNOVER CYSTO (KITS) ×4 IMPLANT
LOOP CUT BIPOLAR 24F LRG (ELECTROSURGICAL) ×4 IMPLANT
MANIFOLD NEPTUNE II (INSTRUMENTS) ×4 IMPLANT
NS IRRIG 500ML POUR BTL (IV SOLUTION) ×4 IMPLANT
PACK CYSTO (CUSTOM PROCEDURE TRAY) ×4 IMPLANT
SYR 10ML LL (SYRINGE) IMPLANT
SYR TOOMEY IRRIG 70ML (MISCELLANEOUS)
SYRINGE TOOMEY IRRIG 70ML (MISCELLANEOUS) IMPLANT
TUBE CONNECTING 12'X1/4 (SUCTIONS)
TUBE CONNECTING 12X1/4 (SUCTIONS) IMPLANT

## 2019-12-13 NOTE — Anesthesia Procedure Notes (Signed)
Procedure Name: LMA Insertion Date/Time: 12/13/2019 10:22 AM Performed by: Suan Halter, CRNA Pre-anesthesia Checklist: Patient identified, Emergency Drugs available, Suction available and Patient being monitored Patient Re-evaluated:Patient Re-evaluated prior to induction Oxygen Delivery Method: Circle system utilized Preoxygenation: Pre-oxygenation with 100% oxygen Induction Type: IV induction Ventilation: Mask ventilation without difficulty LMA: LMA inserted LMA Size: 5.0 Number of attempts: 1 Airway Equipment and Method: Bite block Placement Confirmation: positive ETCO2 Tube secured with: Tape Dental Injury: Teeth and Oropharynx as per pre-operative assessment

## 2019-12-13 NOTE — Op Note (Signed)
NAME: Luis [name], Luis [name] MEDICAL RECORD [id number] ACCOUNT [id number] DATE OF BIRTH:01-26-1956 FACILITY: [facility] LOCATION: [facility] PHYSICIAN:Iveliz Garay Tresa Moore, MD  OPERATIVE REPORT  DATE OF PROCEDURE:  12/13/2019  SURGEON:  Alexis Frock, MD  PREOPERATIVE DIAGNOSIS:  History of superficial high-grade bladder cancer.  PROCEDURE:   1.  Restaging transection bladder tumor, volume small.   2.  Bilateral retrograde pyelograms, interpretation.  ESTIMATED BLOOD LOSS:  Nil.  COMPLICATIONS:  None.  SPECIMENS: 1.  Right bladder erythema for pathology. 2.  Old resection site, left for permanent pathology. 3.  Base of old resection site for permanent pathology.  FINDINGS: 1.  Very riding bladder neck, making visualization quite difficult. 2.  Right-sided bladder neck erythema concerning for possible carcinoma in situ.  This area was biopsied and fulgurated. 3.  No residual gross tumor at the area of the left wall resection site. 4.  Unremarkable bilateral retrograde pyelograms.  INDICATIONS:  The patient is a pleasant but significantly comorbid 64 year old man with history of superficial bladder cancer that is high-grade.  He underwent transurethral resection several weeks ago by a partner of mine who has since retired.  His  care has been transferred to me.  Options were discussed, including recommended path of restaging resection to verify resolution of all resectable disease and to hopefully obtain muscle specimen and he presents for this today.  Informed consent was  obtained and placed in medical record.  DESCRIPTION OF PROCEDURE:  The patient being identified, the procedure being cystoscopy, retrograde and restaging transection of bladder tumor was confirmed.  Procedure timeout was performed.  Intravenous antibiotics were administered.  General  anesthesia induced.  The patient was placed into a low lithotomy position.  Sterile field was created, prepping and draping the patient's  penis, perineum and proximal thighs using iodine.  Cystourethroscopy was initially performed using 21-French rigid  cystoscope with offset lens.  Inspection of the anterior and posterior urethra revealed a very, very high-riding bladder neck.  Inspection of the urinary bladder revealed no diverticula, no calcifications, no papillary lesions.  Old resection site was  noted  superolateral to the left ureteral orifice.  There was no obvious residual tumor.  There was some erythema concerning for possible carcinoma in situ at the right bladder neck-prostate junction area.  Attention was directed at retrograde  pyelograms.  The right ureteral orifice was cannulated with a 6-French renal catheter and right retrograde pyelogram was obtained.  Right retrograde pyelogram demonstrated single right ureter with single system right kidney.  No filling defects or narrowing noted.  Similarly, left retrograde pyelogram was obtained.  Left retrograde pyelogram demonstrated a single left ureter with single system left kidney.  There was prominent notching at the area of the iliac crossing, otherwise unremarkable.  Cystoscope was exchanged for the 26-French resectoscope sheath with  visual obturator.  Using rigid biopsy forceps, a representative sample of the right bladder erythema at the bladder neck was sampled and set aside labeled for permanent pathology.  The base of this was then fulgurated using the loop electrode.  Next, the  same rigid biopsy forceps were used to obtain superficial sample of the old resection site and then a deep seromuscular bite, which appeared to include superficial muscle of the bladder.  This site was then fulgurated using loop electrode, which yielded  excellent hemostasis.  There was no evidence of bladder perforation.  The cystoscope was then removed, keeping some fluid in the bladder for trial of void.  The patient tolerated the procedure well.  No immediate perioperative complications.   The  patient was taken to postanesthesia care unit in stable condition.  Plan for discharge home after void.  VN/NUANCE  D:12/13/2019 T:12/13/2019 JOB:[id number]

## 2019-12-13 NOTE — H&P (Signed)
Luis [name] is an 64 y.o. male.    Chief Complaint: Pre-Op Restaging Transurethral Resection of Bladder Tumor  HPI:   1 - High Grade Bladder Cancer - T1G3 by TURBT + gemcitabine 09/2019 by Luis [name] for small left wall tumor, no muscle in specimen on eval gross hematuria. CT w/o upper tract lesions.   2 - Urinary Retention - on/off uirnary retention 2021. Prostate vol 84mL (fiarly normal for age) by CT ellipsoid calculation 2021. Placed on tamsulosin and passed trial of void 10/2019. FU PVR 11/2019 "51ml"    PMH sig for obesity, CAD/Stents/CHF/Lasix (follows Luis. Algernon [name], ASA only now which he has held prior), lap chole, neck surgery, elbow surgery. He is retired Engineer, structural. His PCP is Luis [name].   Today " Luis [name]" is seen to proceed with cysto, retrogrades, and restaging TURBT for high grade superficial cancer. No interval fevers. C19 screen negative. Most recent UA without significant infectious parameters.    Past Medical History:  Diagnosis Date  . Anxiety    takes Valium as needed  . Aortic stenosis, moderate   . Arthritis   . Ascending aortic aneurysm ([place])   . CAD (coronary artery disease)    a. s/p PCI of the RCA 8/12 with DES by Luis [name], preserved EF. b. LHC/RHC (2/16) with mean RA 12, PA 32/15, mean PCWP 18, CI 3.47; patent mid and distal RCA stents, 50-60% proximal stenosis small PDA.   . Carotid stenosis    a. Carotid US (05/2013):  Bilateral 1-39% ICA; L thyroid nodule (prior hx of aspiration).  . Chronic diastolic CHF (congestive heart failure) ([place])   . Depression   . Essential hypertension   . GERD (gastroesophageal reflux disease)    if needed will take OTC meds   . Heart murmur   . History of colonic polyps    hyperplastic  . Hyperlipidemia   . Joint pain   . Lesion of bladder   . Myocardial infarction ([place]) 2012  . Obesity (BMI 30-39.9) 02/29/2016  . Restless leg   . Sleep apnea    uses cpap  . Tubular adenoma of colon   . Vertigo     takes Meclizine as needed    Past Surgical History:  Procedure Laterality Date  . CATARACT EXTRACTION   4 YRS AGO   BOTH EYES  . CHOLECYSTECTOMY  07/21/2011   Procedure: LAPAROSCOPIC CHOLECYSTECTOMY WITH INTRAOPERATIVE CHOLANGIOGRAM;  Surgeon: Luis Medal, [name];  Location: [facility];  Service: General;  Laterality: N/A;  . CORONARY ANGIOPLASTY  2012   2 stents  . coronary stenting     s/p PCI of the RCA by Luis [name] 8/12 with 2 promus stents  . LEFT AND RIGHT HEART CATHETERIZATION WITH CORONARY ANGIOGRAM N/A 06/23/2014   Procedure: LEFT AND RIGHT HEART CATHETERIZATION WITH CORONARY ANGIOGRAM;  Surgeon: Luis Dresser, [name];  Location: [facility] CATH LAB;  Service: Cardiovascular;  Laterality: N/A;  . NECK SURGERY  03/23/09   per Luis. Lorin [name], cervical fusion   . right elbow surgery    . solonscopy  05/23/08   per Luis. Wynona [name] hemorrhoids only, repeat in 5 years  . TEE WITHOUT CARDIOVERSION N/A 06/23/2014   Procedure: TRANSESOPHAGEAL ECHOCARDIOGRAM (TEE);  Surgeon: Luis Dresser, [name];  Location: [facility];  Service: Cardiovascular;  Laterality: N/A;  . TEE WITHOUT CARDIOVERSION N/A 01/21/2016   Procedure: TRANSESOPHAGEAL ECHOCARDIOGRAM (TEE);  Surgeon: Luis Dresser, [name];  Location: [facility];  Service: Cardiovascular;  Laterality: N/A;  . TONSILLECTOMY    .  TRANSURETHRAL RESECTION OF BLADDER TUMOR N/A 10/21/2019   Procedure: TRANSURETHRAL RESECTION OF BLADDER TUMOR (TURBT);  Surgeon: Luis Rhodes, [name];  Location: [facility];  Service: Urology;  Laterality: N/A;    Family History  Problem Relation Age of Onset  . Lung cancer Mother        lung  . Esophageal cancer Cousin   . Colon cancer Neg Hx   . Rectal cancer Neg Hx   . Stomach cancer Neg Hx    Social History:  reports that he quit smoking about 9 years ago. His smoking use included cigarettes. He has a 80.00 pack-year smoking history. He has never used smokeless tobacco. He reports that he does not drink  alcohol and does not use drugs.  Allergies: No Known Allergies  No medications prior to admission.    No results found for this or any previous visit (from the past 48 hour(s)). No results found.  Review of Systems  Constitutional: Negative for chills and fever.  HENT: Negative.   All other systems reviewed and are negative.   Height 5\' 6"  (1.676 m), weight 123.4 kg. Physical Exam HENT:     Head: Normocephalic.     Nose: Nose normal.     Mouth/Throat:     Mouth: Mucous membranes are moist.  Eyes:     Pupils: Pupils are equal, round, and reactive to light.  Cardiovascular:     Rate and Rhythm: Normal rate.     Pulses: Normal pulses.  Pulmonary:     Effort: Pulmonary effort is normal.  Abdominal:     Comments: Stable moderate truncal obesity  Genitourinary:    Penis: Normal.   Musculoskeletal:        General: Normal range of motion.     Cervical back: Normal range of motion.  Skin:    General: Skin is warm.  Neurological:     General: No focal deficit present.     Mental Status: He is alert.  Psychiatric:        Mood and Affect: Mood normal.      Assessment/Plan  Proceed as planned with restaging TURBT and retrogrades. Risks, benefits, alternatives, expected peri-op course discussed previously and reiterated today.   Luis Frock, [name] 12/13/2019, 5:29 AM

## 2019-12-13 NOTE — Brief Op Note (Signed)
12/13/2019  10:55 AM  PATIENT:  Luis [name]  64 y.o. male  PRE-OPERATIVE DIAGNOSIS:  BLADDER CANCER  POST-OPERATIVE DIAGNOSIS:  BLADDER CANCER  PROCEDURE:  Procedure(s) with comments: TRANSURETHRAL RESECTION OF BLADDER TUMOR (TURBT) (N/A) - 1 HR CYSTOSCOPY WITH RETROGRADE PYELOGRAM (Bilateral)  SURGEON:  Surgeon(s) and Role:    * Alexis Frock, MD - Primary  PHYSICIAN ASSISTANT:   ASSISTANTS: none   ANESTHESIA:   general  EBL:  0 mL   BLOOD ADMINISTERED:none  DRAINS: none   LOCAL MEDICATIONS USED:  NONE  SPECIMEN:  Source of Specimen:  1 - Rt bladder erythema; 2 - Old left resection site; 3 -base of old resection site  DISPOSITION OF SPECIMEN:  PATHOLOGY  COUNTS:  YES  TOURNIQUET:  * No tourniquets in log *  DICTATION: .Other Dictation: Dictation Number [id number]  PLAN OF CARE: Discharge to home after PACU  PATIENT DISPOSITION:  PACU - hemodynamically stable.   Delay start of Pharmacological VTE agent (>24hrs) due to surgical blood loss or risk of bleeding: yes

## 2019-12-13 NOTE — Transfer of Care (Signed)
  Immediate Anesthesia Transfer of Care Note  Patient: Luis [name]  Procedure(s) Performed: Procedure(s) (LRB): TRANSURETHRAL RESECTION OF BLADDER TUMOR (TURBT) (N/A) CYSTOSCOPY WITH RETROGRADE PYELOGRAM (Bilateral)  Patient Location: PACU  Anesthesia Type: General  Level of Consciousness: awake, sedated, patient cooperative and responds to stimulation  Airway & Oxygen Therapy: Patient Spontanous Breathing and Patient connected to [place] 02 and soft FM   Post-op Assessment: Report given to PACU RN, Post -op Vital signs reviewed and stable and Patient moving all extremities HOB elevated   Post vital signs: Reviewed and stable  Complications: No apparent anesthesia complications

## 2019-12-13 NOTE — Discharge Instructions (Signed)
1 - You may have urinary urgency (bladder spasms) and bloody urine on / off  for up to 2 seeks.  This is normal.  2 - Call MD or go to ER for fever >102, severe pain / nausea / vomiting not relieved by medications, or acute change in medical status  Transurethral Resection of Bladder Tumor, Care After This sheet gives you information about how to care for yourself after your procedure. Your health care provider may also give you more specific instructions. If you have problems or questions, contact your health care provider. What can I expect after the procedure? After the procedure, it is common to have:  A small amount of blood in your urine for up to 2 weeks.  Soreness or mild pain from your catheter. After your catheter is removed, you may have mild soreness, especially when urinating.  Pain in your lower abdomen. Follow these instructions at home: Medicines   Take over-the-counter and prescription medicines only as told by your health care provider.  If you were prescribed an antibiotic medicine, take it as told by your health care provider. Do not stop taking the antibiotic even if you start to feel better.  Do not drive for 24 hours if you were given a sedative during your procedure.  Ask your health care provider if the medicine prescribed to you: ? Requires you to avoid driving or using heavy machinery. ? Can cause constipation. You may need to take these actions to prevent or treat constipation:  Take over-the-counter or prescription medicines.  Eat foods that are high in fiber, such as beans, whole grains, and fresh fruits and vegetables.  Limit foods that are high in fat and processed sugars, such as fried or sweet foods. Activity  Return to your normal activities as told by your health care provider. Ask your health care provider what activities are safe for you.  Do not lift anything that is heavier than 10 lb (4.5 kg), or the limit that you are told, until your  health care provider says that it is safe.  Avoid intense physical activity for as long as told by your health care provider.  Rest as told by your health care provider.  Avoid sitting for a long time without moving. Get up to take short walks every 1-2 hours. This is important to improve blood flow and breathing. Ask for help if you feel weak or unsteady. General instructions   Do not drink alcohol for as long as told by your health care provider. This is especially important if you are taking prescription pain medicines.  Do not take baths, swim, or use a hot tub until your health care provider approves. Ask your health care provider if you may take showers. You may only be allowed to take sponge baths.  If you have a catheter, follow instructions from your health care provider about caring for your catheter and your drainage bag.  Drink enough fluid to keep your urine pale yellow.  Wear compression stockings as told by your health care provider. These stockings help to prevent blood clots and reduce swelling in your legs.  Keep all follow-up visits as told by your health care provider. This is important. ? You will need to be followed closely with regular checks of your bladder and urethra (cystoscopies) to make sure that the cancer does not come back. Contact a health care provider if:  You have pain that gets worse or does not improve with medicine.  You have  blood in your urine for more than 2 weeks.  You have cloudy or bad-smelling urine.  You become constipated. Signs of constipation may include having: ? Fewer than three bowel movements in a week. ? Difficulty having a bowel movement. ? Stools that are dry, hard, or larger than normal.  You have a fever. Get help right away if:  You have: ? Severe pain. ? Bright red blood in your urine. ? Blood clots in your urine. ? A lot of blood in your urine.  Your catheter has been removed and you are not able to  urinate.  You have a catheter in place and the catheter is not draining urine. Summary  After your procedure, it is common to have a small amount of blood in your urine, soreness or mild pain from your catheter, and pain in your lower abdomen.  Take over-the-counter and prescription medicines only as told by your health care provider.  Rest as told by your health care provider. Follow your health care provider's instructions about returning to normal activities. Ask what activities are safe for you.  If you have a catheter, follow instructions from your health care provider about caring for your catheter and your drainage bag.  Get help right away if you cannot urinate, you have severe pain, or you have bright red blood or blood clots in your urine. This information is not intended to replace advice given to you by your health care provider. Make sure you discuss any questions you have with your health care provider. Document Revised: 11/09/2017 Document Reviewed: 11/09/2017 Elsevier Patient Education  [place] Instructions  Activity: Get plenty of rest for the remainder of the day. A responsible individual must stay with you for 24 hours following the procedure.  For the next 24 hours, DO NOT: -Drive a car -Paediatric nurse -Drink alcoholic beverages -Take any medication unless instructed by your physician -Make any legal decisions or sign important papers.  Meals: Start with liquid foods such as gelatin or soup. Progress to regular foods as tolerated. Avoid greasy, spicy, heavy foods. If nausea and/or vomiting occur, drink only clear liquids until the nausea and/or vomiting subsides. Call your physician if vomiting continues.  Special Instructions/Symptoms: Your throat may feel dry or sore from the anesthesia or the breathing tube placed in your throat during surgery. If this causes discomfort, gargle with warm salt water. The discomfort  should disappear within 24 hours.

## 2019-12-13 NOTE — Anesthesia Postprocedure Evaluation (Signed)
Anesthesia Post Note  Patient: Luis [name]  Procedure(s) Performed: TRANSURETHRAL RESECTION OF BLADDER TUMOR (TURBT) (N/A Bladder) CYSTOSCOPY WITH RETROGRADE PYELOGRAM (Bilateral Bladder)     Patient location during evaluation: PACU Anesthesia Type: General Level of consciousness: awake and alert, oriented and patient cooperative Pain management: pain level controlled Vital Signs Assessment: post-procedure vital signs reviewed and stable Respiratory status: spontaneous breathing, nonlabored ventilation and respiratory function stable Cardiovascular status: blood pressure returned to baseline and stable Postop Assessment: no apparent nausea or vomiting Anesthetic complications: no   No complications documented.  Last Vitals:  Vitals:   12/13/19 1104 12/13/19 1115  BP: 134/65 116/65  Pulse: 84 66  Resp: 13 13  Temp: (!) 36.3 C   SpO2: 100% 98%    Last Pain:  Vitals:   12/13/19 1104  TempSrc:   PainSc: 0-No pain                 Pervis Hocking

## 2019-12-16 ENCOUNTER — Encounter (HOSPITAL_BASED_OUTPATIENT_CLINIC_OR_DEPARTMENT_OTHER): Payer: Self-pay | Admitting: Urology

## 2019-12-17 LAB — SURGICAL PATHOLOGY

## 2019-12-19 ENCOUNTER — Other Ambulatory Visit (HOSPITAL_COMMUNITY): Payer: Self-pay | Admitting: Cardiology

## 2019-12-26 DIAGNOSIS — R338 Other retention of urine: Secondary | ICD-10-CM | POA: Diagnosis not present

## 2019-12-26 DIAGNOSIS — C678 Malignant neoplasm of overlapping sites of bladder: Secondary | ICD-10-CM | POA: Diagnosis not present

## 2020-01-03 DIAGNOSIS — Z5111 Encounter for antineoplastic chemotherapy: Secondary | ICD-10-CM | POA: Diagnosis not present

## 2020-01-03 DIAGNOSIS — C678 Malignant neoplasm of overlapping sites of bladder: Secondary | ICD-10-CM | POA: Diagnosis not present

## 2020-01-10 DIAGNOSIS — Z5111 Encounter for antineoplastic chemotherapy: Secondary | ICD-10-CM | POA: Diagnosis not present

## 2020-01-10 DIAGNOSIS — C678 Malignant neoplasm of overlapping sites of bladder: Secondary | ICD-10-CM | POA: Diagnosis not present

## 2020-01-15 ENCOUNTER — Telehealth: Payer: Self-pay | Admitting: Pharmacist

## 2020-01-15 NOTE — Chronic Care Management (AMB) (Signed)
I left the patient a message about his upcoming appointment on 01/16/2020 @ 3:30 pm with the clinical pharmacist. He was asked to please have all medication on had to review the pharmacist.

## 2020-01-16 ENCOUNTER — Ambulatory Visit: Payer: PPO

## 2020-01-16 DIAGNOSIS — E785 Hyperlipidemia, unspecified: Secondary | ICD-10-CM

## 2020-01-16 DIAGNOSIS — I10 Essential (primary) hypertension: Secondary | ICD-10-CM

## 2020-01-16 NOTE — Chronic Care Management (AMB) (Addendum)
Chronic Care Management Pharmacy  Name: Luis [name]  MRN: [id number] DOB: Mar 13, 1956  Initial Questions: 1. Have you seen any other providers since your last visit? Yes  2. Any changes in your medicines or health? Yes   Chief Complaint/ HPI  Luis [name],  64 y.o. , male presents for their Follow-Up CCM visit with the clinical pharmacist via telephone due to COVID-19 Pandemic.  PCP : Laurey Morale, MD  Their chronic conditions include: HTN, HLD, CAD, diastolic HF, anxiety disorder, chronic rhinitis, vertigo, pain    Office Visits: 08/08/19 [name], MD: Patient presented with dark urine and low back pain and mild pain in testicles. Suspected prostatitis and prescribed Cipro and recommended lots of water.  07/25/2018- Patient presented to Dr. [name], MD for tick bite on lower abdomen. Patient used cortisone cream for red itchy rash that developed in the area. Patient prescribed 10 day course of doxycycline.   Consult Visit: 12/13/19 - Patient was admitted for transurethral resection of bladder tumor.  12/05/19 [name] ([facility]): Patient presented for osteoarthritis of elbow follow up. Unable to access notes.  10/24/19 Azucena Fallen: Patient presented for BPH follow up. Unable to access notes.  10/22/19 Kathie Rhodes (urology): Patient presented for BPH and urination urgency follow up. Unable to access notes.  10/21/19 Kathie Rhodes: Patient was admitted for undiagnosed new problem of urothelial malignancy on the right wall of his bladder. Recommended bladder biospy.  09/03/19 Kathie Rhodes (urology): Patient presented with hematuria follow up. Unable to access notes.  08/10/19: Patient presented to the ED with hematuria and back pain. Patient reports this was the first incidence. Patient was recently started on cipro which made it worse. Held aspirin and started cefpodoxime. Planned follow up with urology.  07/04/2019- Cardiology- Patient presented to Dr. [name], MD for AS and CAD follow up.  Patient was started on Imdur for angina, but patient developed severe headaches. Patient to continue ASA 81 and Crestor. For aortic stenosis, patient to obtain repeat ECHO on 8/21. Patient to obtain repeat CTA chest on 8/21 for ascending aortic aneurysm. For CHF, patient to continue Lasix 40mg  daily. Patient to stop potassium supplementation based on BMET. Patient to continue losartan for good BP control.   05/03/2019- Cardiology- Patient presented via virtual visit to Dr. Fransico Him, MD for OSA.  Patient tolerating PAP for OSA. For HTN, discussed sodium reduction and to continue losartan 50mg  daily.   Medications: Outpatient Encounter Medications as of 01/16/2020  Medication Sig   tamsulosin (FLOMAX) 0.4 MG CAPS capsule Take 0.4 mg by mouth daily.   aspirin 81 MG tablet Take 81 mg by mouth every morning.    citalopram (CELEXA) 40 MG tablet TAKE 1 TABLET BY MOUTH EVERY DAY (Patient taking differently: Take 40 mg by mouth daily. )   diazepam (VALIUM) 5 MG tablet TAKE 1 TABLET (5 MG TOTAL) BY MOUTH EVERY 12 (TWELVE) HOURS AS NEEDED FOR ANXIETY.   furosemide (LASIX) 40 MG tablet TAKE 1 TABLET BY MOUTH EVERY DAY   HYDROcodone-acetaminophen (NORCO) 10-325 MG tablet Take 1 tablet by mouth every 4 (four) hours as needed for moderate pain (Post-operatively). Maximum dose per 24 hours - 8 pills   ibuprofen (ADVIL,MOTRIN) 200 MG tablet Take 200 mg by mouth 2 (two) times daily as needed for moderate pain.    ipratropium (ATROVENT) 0.03 % nasal spray Place 2 sprays into both nostrils every 12 (twelve) hours as needed for rhinitis.   losartan (COZAAR) 50 MG  tablet TAKE 1 TABLET BY MOUTH EVERY DAY   MAGNESIUM PO Take 500 mg by mouth daily.   meclizine (ANTIVERT) 25 MG tablet Take 1 tablet (25 mg total) by mouth 3 (three) times daily as needed for dizziness.   nitroGLYCERIN (NITROSTAT) 0.4 MG SL tablet Place 1 tablet (0.4 mg total) under the tongue every 5 (five)  minutes as needed. For chest pain.   phenazopyridine (PYRIDIUM) 200 MG tablet Take 1 tablet (200 mg total) by mouth 3 (three) times daily as needed for pain.   rosuvastatin (CRESTOR) 20 MG tablet Take 1 tablet (20 mg total) by mouth at bedtime.   No facility-administered encounter medications on file as of 01/16/2020.    Current Diagnosis/Assessment:  Goals Addressed            This Visit's Progress    Pharmacy Care Plan       CARE PLAN ENTRY  Current Barriers:   Chronic Disease Management support, education, and care coordination needs related to CHF, CAD, HTN, HLD, and anxiety, chronic rhinitis, vertigo, pain   Pharmacist Clinical Goal(s):   Work with the care management team to address educational, disease management, and care coordination needs   Call provider office for new or worsened signs and symptoms   Continue self health monitoring activities as directed today   Call care management team with questions or concerns.   Heart failure: Check weight daily and contact physician if weight gain of more than 3 pounds overnight or more than 5 pounds in a week.  Blood pressure:   Maintain blood pressure within goal of your provider (130/80 or 140/90).  Recent home blood pressure reading: 138/76 mmHg (01/16/2020)  Last office blood pressure reading: 130/79 mmHg (07/16/2019)  High cholesterol:   Cholesterol goals: Total Cholesterol goal under 200, Triglycerides goal under 150, HDL goal above 40 (men) or above 50 (women), LDL goal under 70.   Recent cholesterol levels (12/03/2018) - Total cholesterol: 154 - Triglycerides: 224 - HDL: 50 - LDL: 59  Anxiety: Continue to see improvement in anxiety symptoms  Chronic rhinitis  Minimize symptoms related to allergies.   Vertigo   Minimize episodes of dizziness.   Pain  Continue to see improvement in pain level.   Interventions:  Comprehensive medication review performed.  Evaluation of current treatment plans  and patient's adherence to plan as established by provider  Assessed patient's education and care coordination needs  Provided disease specific education to patient  Heart failure  Continue:  - losartan 50mg , 1 tablet once daily  - furosemide 40mg , 1 tablet once daily   Blood pressure:   Discussed need to continue checking blood pressure at home.   Discussed diet modifications. DASH diet:  following a diet emphasizing fruits and vegetables and low-fat dairy products along with whole grains, fish, poultry, and nuts. Reducing red meats and sugars.   Exercising  Reducing the amount of salt intake to 1500mg /per day.   Recommend using a salt substitute to replace your salt if you need flavor.      Weight reduction- We discussed losing 5-10% of body weight  Discussed and reviewed technique for measuring blood pressure  Continue:  -  losartan 50mg , 1 tablet once daily   High cholesterol/ aortic stenosis   How to reduce cholesterol through diet/weight management and physical activity.     We discussed how a diet high in plant sterols (fruits/vegetables/nuts/whole grains/legumes) may reduce your cholesterol.  Encouraged increasing fiber to a daily intake of 10-25g/day  Continue:  - Rosuvastatin 20mg , 1 tablet once daily at bedtime  - Nitroglycerin 0.4mg  SL tablet, 1 tablet under tongue every five minutes as needed for chest pain - Aspirin 81mg , 1 tablet daily   Anxiety  Continue:   - citalopram 40mg , 1 tablet once daily - diazepam 5mg , 1 tablet every twelve hours as needed for anxiety   Chronic rhinitis  Continue: ipratropium 0.03% nasal spray (Atrovent), 2 sprays into both nostrils every 12 hours as needed  Vertigo  Continue: Meclizine 25mg , 1 tablet three times daily as needed for dizziness.   Pain  Continue: ibuprofen 200mg , 1 tablet two times daily as needed for moderate pain   Patient Self Care Activities:   Self administers medications as prescribed and  Calls provider office for new concerns or questions  Continue current medications as directed by providers.   Continue following up with primary care provider and/or specialists.  Continue at home blood pressure readings.  Continue working on health habits (diet/ exercise).  Please see past updates related to this goal by clicking on the "Past Updates" button in the selected goal         Heart Failure  Type: Diastolic  Last ejection fraction: 60-65% (12/07/2018)  NYHA Class: II (slight limitation of activity)   Patient has failed these meds in past: none  Patient is currently controlled on the following medications:  - losartan 50mg , 1 tablet once daily  - furosemide 40mg , 1 tablet once daily   We discussed weighing daily; if you gain more than 3 pounds in one day or 5 pounds in one week call your doctor.  - patient denies swelling as this time (typically his swelling is around ankles) - patient notes weighing every morning and weight is currently stable at: 273 lbs.    Plan Continue current medications  Patient reports his potassium was stopped. ,  Hypertension   Office blood pressures are: BP Readings from Last 3 Encounters:  12/13/19 130/79  10/21/19 125/77  08/10/19 (!) 125/59   Patient has failed these meds in the past: none   Patient checks BP at home 1-2x per week  Patient home BP readings are ranging: 138/76 mmHg   Patient is controlled on:  - losartan 50mg , 1 tablet once daily   We discussed diet and exercise extensively   Diet: patient reports swapping salt for pepper as seasoning and avoiding added salt  Exercising: patient reports he enjoys walking but his knees bother him  Reducing the amount of salt intake to < 1500mg /per day.   Discussed and reviewed technique for measuring blood pressure.   Plan  Continue current medications.    Hyperlipidemia/ CAD (aortic stenosis)   LDL goal < 70  Lipid Panel     Component Value Date/Time   CHOL  154 12/03/2018 1155   TRIG 224 (H) 12/03/2018 1155   HDL 50 12/03/2018 1155   CHOLHDL 3.1 12/03/2018 1155   VLDL 45 (H) 12/03/2018 1155   LDLCALC 59 12/03/2018 1155   LDLDIRECT 145.6 03/16/2007 0908    The 10-year ASCVD risk score Mikey Bussing DC Jr., et al., 2013) is: 13.8%   Values used to calculate the score:     Age: 83 years     Sex: Male     Is Non-Hispanic African American: No     Diabetic: No     Tobacco smoker: No     Systolic Blood Pressure: 161 mmHg     Is BP treated: Yes  HDL Cholesterol: 50 mg/dL     Total Cholesterol: 154 mg/dL   Patient has failed these meds in past: none  Patient is currently managed on the following medications:  - rosuvastatin 20mg , 1 tablet once daily at bedtime  - nitroglycerin 0.4mg  SL tablet, 1 tablet under tongue every five minutes as needed for chest pain - ASA 81mg , 1 tablet daily   We discussed:  diet and exercise extensively   How to reduce cholesterol through diet/weight management and physical activity.     We discussed how a diet high in plant sterols (fruits/vegetables/nuts/whole grains/legumes) may reduce your cholesterol.  Encouraged increasing fiber to a daily intake of 10-25g/day   Plan Patient notes has not needed to use nitroglycerin.    Continue current medications.  Managed by cardiology - due for repeat lipid panel  BPH   PSA  Date Value Ref Range Status  11/26/2014 1.69 0.10 - 4.00 ng/mL Final  03/16/2007 1.74 0.10 - 4.00 ng/mL Final     Patient is currently controlled on the following medications:   tamsulosin 0.4 mg 1 capsule daily  Plan Managed by urology. Continue current medications  Anxiety disorder   Patient has failed these meds in past: escitalopram (cost)  Patient is currently controlled on the following medications:  - citalopram 40mg , 1 tablet once daily - diazepam 5mg , 1 tablet every twelve hours as needed for anxiety   Plan Patient noted not needed to use diazepam lately (last dispensed 30  ds on 11/20/19) Continue current medications  Chronic rhinitis   Patient has failed these meds in past: none  Patient is currently controlled on the following medications:  - ipratropium 0.03% nasal spray (Atrovent), 2 sprays into both nostrils every 12 hours as needed (per patient report, has not needed to use it in awhile).   Plan Continue current medications   Vertigo   Patient has failed these meds in past: none  Patient is currently controlled on the following medications:   Meclizine 25mg , 1 tablet three times daily as needed for dizziness.   Plan Continue current medications.   Pain   Patient has failed these meds in past: none  Patient is currently controlled on the following medications:  -ibuprofen 200mg , 1 tablet two times daily as needed for moderate pain (patient notes not using for some weeks).  -Hydrocodone-APAP 10-325, 1 tablet as needed (1 tablet 2 days ago)  Plan Continue current medications.  Recommended trying Voltaren gel for joint pain to avoid using ibuprofen and hydrocodone-APAP.  Medication Management   Pt uses [facility] for all medications Uses pill box? No - uses a rack Pt endorses 100% compliance (maybe once a year)  We discussed: Discussed benefits of medication synchronization, packaging and delivery as well as enhanced pharmacist oversight with [facility].  Plan  Continue current medication management strategy   Follow up: 6 month phone visit  [name], PharmD Clinical Pharmacist [place] [facility] [place] [phone]

## 2020-01-17 DIAGNOSIS — C678 Malignant neoplasm of overlapping sites of bladder: Secondary | ICD-10-CM | POA: Diagnosis not present

## 2020-01-17 DIAGNOSIS — Z5111 Encounter for antineoplastic chemotherapy: Secondary | ICD-10-CM | POA: Diagnosis not present

## 2020-01-20 DIAGNOSIS — M19021 Primary osteoarthritis, right elbow: Secondary | ICD-10-CM | POA: Diagnosis not present

## 2020-01-20 DIAGNOSIS — M24521 Contracture, right elbow: Secondary | ICD-10-CM | POA: Diagnosis not present

## 2020-01-20 DIAGNOSIS — M25721 Osteophyte, right elbow: Secondary | ICD-10-CM | POA: Diagnosis not present

## 2020-01-20 DIAGNOSIS — M24021 Loose body in right elbow: Secondary | ICD-10-CM | POA: Diagnosis not present

## 2020-01-20 DIAGNOSIS — G8918 Other acute postprocedural pain: Secondary | ICD-10-CM | POA: Diagnosis not present

## 2020-01-20 NOTE — Patient Instructions (Addendum)
Hello [name]!  It was great to get to speak with you over the telephone! I want to encourage you to continue as you have been with checking your blood pressure and making some of those diet changes (reducing sodium intake to <1500 mg/day) and exercising as much as possible in order to maintain a heart healthy lifestyle. I also attached some information for some foods to avoid with elevated triglycerides as we discussed on the phone.  Don't hesitate to reach out to me at any point if you need anything!  Best, Maddie  [name], PharmD Clinical Pharmacist [facility]   Visit Information  Goals Addressed            This Visit's Progress   . Pharmacy Care Plan       CARE PLAN ENTRY  Current Barriers:  . Chronic Disease Management support, education, and care coordination needs related to CHF, CAD, HTN, HLD, and anxiety, chronic rhinitis, vertigo, pain   Pharmacist Clinical Goal(s):  [name] Work with the care management team to address educational, disease management, and care coordination needs  . Call provider office for new or worsened signs and symptoms  . Continue self health monitoring activities as directed today  . Call care management team with questions or concerns.  . Heart failure: Check weight daily and contact physician if weight gain of more than 3 pounds overnight or more than 5 pounds in a week. . Blood pressure:  [name] Maintain blood pressure within goal of your provider (130/80 or 140/90). . Recent home blood pressure reading: 138/76 mmHg (01/16/2020) . Last office blood pressure reading: 130/79 mmHg (07/16/2019) . High cholesterol:  . Cholesterol goals: Total Cholesterol goal under 200, Triglycerides goal under 150, HDL goal above 40 (men) or above 50 (women), LDL goal under 70.  [name] Recent cholesterol levels (12/03/2018) - Total cholesterol: 154 - Triglycerides: 224 - HDL: 50 - LDL: 59 . Anxiety: Continue to see improvement in  anxiety symptoms . Chronic rhinitis . Minimize symptoms related to allergies.  . Vertigo  . Minimize episodes of dizziness.  . Pain . Continue to see improvement in pain level.   Interventions: . Comprehensive medication review performed. . Evaluation of current treatment plans and patient's adherence to plan as established by provider . Assessed patient's education and care coordination needs . Provided disease specific education to patient . Heart failure . Continue:  - losartan 50mg , 1 tablet once daily  - furosemide 40mg , 1 tablet once daily  . Blood pressure:  . Discussed need to continue checking blood pressure at home.  . Discussed diet modifications. DASH diet:  following a diet emphasizing fruits and vegetables and low-fat dairy products along with whole grains, fish, poultry, and nuts. Reducing red meats and sugars.  . Exercising . Reducing the amount of salt intake to 1500mg /per day.  . Recommend using a salt substitute to replace your salt if you need flavor.     . Weight reduction- We discussed losing 5-10% of body weight . Discussed and reviewed technique for measuring blood pressure . Continue:  -  losartan 50mg , 1 tablet once daily  . High cholesterol/ aortic stenosis  . How to reduce cholesterol through diet/weight management and physical activity.    . We discussed how a diet high in plant sterols (fruits/vegetables/nuts/whole grains/legumes) may reduce your cholesterol.  Encouraged increasing fiber to a daily intake of 10-25g/day  . Continue:  - Rosuvastatin 20mg , 1 tablet once daily at bedtime  -  Nitroglycerin 0.4mg  SL tablet, 1 tablet under tongue every five minutes as needed for chest pain - Aspirin 81mg , 1 tablet daily  . Anxiety . Continue:   - citalopram 40mg , 1 tablet once daily - diazepam 5mg , 1 tablet every twelve hours as needed for anxiety  . Chronic rhinitis . Continue: ipratropium 0.03% nasal spray (Atrovent), 2 sprays into both nostrils every  12 hours as needed . Vertigo . Continue: Meclizine 25mg , 1 tablet three times daily as needed for dizziness.  . Pain . Continue: ibuprofen 200mg , 1 tablet two times daily as needed for moderate pain   Patient Self Care Activities:  . Self administers medications as prescribed and Calls provider office for new concerns or questions . Continue current medications as directed by providers.  . Continue following up with primary care provider and/or specialists. . Continue at home blood pressure readings. . Continue working on health habits (diet/ exercise).  Please see past updates related to this goal by clicking on the "Past Updates" button in the selected goal         The patient verbalized understanding of instructions provided today and declined a print copy of patient instruction materials.   Telephone follow up appointment with pharmacy team member scheduled for: 6 months   High Triglycerides Eating Plan Triglycerides are a type of fat in the blood. High levels of triglycerides can increase your risk of heart disease and stroke. If your triglyceride levels are high, choosing the right foods can help lower your triglycerides and keep your heart healthy. Work with your health care provider or a diet and nutrition specialist (dietitian) to develop an eating plan that is right for you. What are tips for following this plan? General guidelines   Lose weight, if you are overweight. For most people, losing 5-10 lbs (2-5 kg) helps lower triglyceride levels. A weight-loss plan may include. ? 30 minutes of exercise at least 5 days a week. ? Reducing the amount of calories, sugar, and fat you eat.  Eat a wide variety of fresh fruits, vegetables, and whole grains. These foods are high in fiber.  Eat foods that contain healthy fats, such as fatty fish, nuts, seeds, and olive oil.  Avoid foods that are high in added sugar, added salt (sodium), saturated fat, and trans fat.  Avoid  low-fiber, refined carbohydrates such as white bread, crackers, noodles, and white rice.  Avoid foods with partially hydrogenated oils (trans fats), such as fried foods or stick margarine.  Limit alcohol intake to no more than 1 drink a day for nonpregnant women and 2 drinks a day for men. One drink equals 12 oz of beer, 5 oz of wine, or 1 oz of hard liquor. Your health care provider may recommend that you drink less depending on your overall health. Reading food labels  Check food labels for the amount of saturated fat. Choose foods with no or very little saturated fat.  Check food labels for the amount of trans fat. Choose foods with no trans fat.  Check food labels for the amount of cholesterol. Choose foods low in cholesterol. Ask your dietitian how much cholesterol you should have each day.  Check food labels for the amount of sodium. Choose foods with less than 140 milligrams (mg) per serving. Shopping  Buy dairy products labeled as nonfat (skim) or low-fat (1%).  Avoid buying processed or prepackaged foods. These are often high in added sugar, sodium, and fat. Cooking  Choose healthy fats when cooking, such as  olive oil or canola oil.  Cook foods using lower fat methods, such as baking, broiling, boiling, or grilling.  Make your own sauces, dressings, and marinades when possible, instead of buying them. Store-bought sauces, dressings, and marinades are often high in sodium and sugar. Meal planning  Eat more home-cooked food and less restaurant, buffet, and fast food.  Eat fatty fish at least 2 times each week. Examples of fatty fish include salmon, trout, mackerel, tuna, and herring.  If you eat whole eggs, do not eat more than 3 egg yolks per week. What foods are recommended? The items listed may not be a complete list. Talk with your dietitian about what dietary choices are best for you. Grains Whole wheat or whole grain breads, crackers, cereals, and pasta. Unsweetened  oatmeal. Bulgur. Barley. Quinoa. Brown rice. Whole wheat flour tortillas. Vegetables Fresh or frozen vegetables. Low-sodium canned vegetables. Fruits All fresh, canned (in natural juice), or frozen fruits. Meats and other protein foods Skinless chicken or [place]. Ground chicken or [place]. Lean cuts of pork, trimmed of fat. Fish and seafood, especially salmon, trout, and herring. Egg whites. Dried beans, peas, or lentils. Unsalted nuts or seeds. Unsalted canned beans. Natural peanut or almond butter. Dairy Low-fat dairy products. Skim or low-fat (1%) milk. Reduced fat (2%) and low-sodium cheese. Low-fat ricotta cheese. Low-fat cottage cheese. Plain, low-fat yogurt. Fats and oils Tub margarine without trans fats. Light or reduced-fat mayonnaise. Light or reduced-fat salad dressings. Avocado. Safflower, olive, sunflower, soybean, and canola oils. What foods are not recommended? The items listed may not be a complete list. Talk with your dietitian about what dietary choices are best for you. Grains White bread. White (regular) pasta. White rice. Cornbread. Bagels. Pastries. Crackers that contain trans fat. Vegetables Creamed or fried vegetables. Vegetables in a cheese sauce. Fruits Sweetened dried fruit. Canned fruit in syrup. Fruit juice. Meats and other protein foods Fatty cuts of meat. Ribs. Chicken wings. Berniece Salines. Sausage. Bologna. Salami. Chitterlings. Fatback. Hot dogs. Bratwurst. Packaged lunch meats. Dairy Whole or reduced-fat (2%) milk. Half-and-half. Cream cheese. Full-fat or sweetened yogurt. Full-fat cheese. Nondairy creamers. Whipped toppings. Processed cheese or cheese spreads. Cheese curds. Beverages Alcohol. Sweetened drinks, such as soda, lemonade, fruit drinks, or punches. Fats and oils Butter. Stick margarine. Lard. Shortening. Ghee. Bacon fat. Tropical oils, such as coconut, palm kernel, or palm oils. Sweets and desserts Corn syrup. Sugars. Honey. Molasses. Candy. Jam and  jelly. Syrup. Sweetened cereals. Cookies. Pies. Cakes. Donuts. Muffins. Ice cream. Condiments Store-bought sauces, dressings, and marinades that are high in sugar, such as ketchup and barbecue sauce. Summary  High levels of triglycerides can increase the risk of heart disease and stroke. Choosing the right foods can help lower your triglycerides.  Eat plenty of fresh fruits, vegetables, and whole grains. Choose low-fat dairy and lean meats. Eat fatty fish at least twice a week.  Avoid processed and prepackaged foods with added sugar, sodium, saturated fat, and trans fat.  If you need suggestions or have questions about what types of food are good for you, talk with your health care provider or a dietitian. This information is not intended to replace advice given to you by your health care provider. Make sure you discuss any questions you have with your health care provider. Document Revised: 03/24/2017 Document Reviewed: 06/14/2016 Elsevier Patient Education  2020 [employer].

## 2020-01-24 DIAGNOSIS — Z5111 Encounter for antineoplastic chemotherapy: Secondary | ICD-10-CM | POA: Diagnosis not present

## 2020-01-24 DIAGNOSIS — C678 Malignant neoplasm of overlapping sites of bladder: Secondary | ICD-10-CM | POA: Diagnosis not present

## 2020-01-31 DIAGNOSIS — Z5111 Encounter for antineoplastic chemotherapy: Secondary | ICD-10-CM | POA: Diagnosis not present

## 2020-01-31 DIAGNOSIS — C678 Malignant neoplasm of overlapping sites of bladder: Secondary | ICD-10-CM | POA: Diagnosis not present

## 2020-02-04 DIAGNOSIS — Z4789 Encounter for other orthopedic aftercare: Secondary | ICD-10-CM | POA: Diagnosis not present

## 2020-02-04 DIAGNOSIS — M25521 Pain in right elbow: Secondary | ICD-10-CM | POA: Diagnosis not present

## 2020-02-04 DIAGNOSIS — M19021 Primary osteoarthritis, right elbow: Secondary | ICD-10-CM | POA: Diagnosis not present

## 2020-02-07 DIAGNOSIS — Z5111 Encounter for antineoplastic chemotherapy: Secondary | ICD-10-CM | POA: Diagnosis not present

## 2020-02-07 DIAGNOSIS — C678 Malignant neoplasm of overlapping sites of bladder: Secondary | ICD-10-CM | POA: Diagnosis not present

## 2020-02-13 DIAGNOSIS — M25521 Pain in right elbow: Secondary | ICD-10-CM | POA: Diagnosis not present

## 2020-02-20 DIAGNOSIS — M25521 Pain in right elbow: Secondary | ICD-10-CM | POA: Diagnosis not present

## 2020-02-25 DIAGNOSIS — M25521 Pain in right elbow: Secondary | ICD-10-CM | POA: Diagnosis not present

## 2020-03-18 ENCOUNTER — Other Ambulatory Visit (HOSPITAL_COMMUNITY): Payer: Self-pay | Admitting: Cardiology

## 2020-03-31 DIAGNOSIS — R338 Other retention of urine: Secondary | ICD-10-CM | POA: Diagnosis not present

## 2020-03-31 DIAGNOSIS — C678 Malignant neoplasm of overlapping sites of bladder: Secondary | ICD-10-CM | POA: Diagnosis not present

## 2020-04-03 ENCOUNTER — Telehealth: Payer: Self-pay | Admitting: Family Medicine

## 2020-04-03 NOTE — Telephone Encounter (Signed)
Left message for patient to call back and schedule Medicare Annual Wellness Visit (AWV) either virtually or in office.   Last AWV no information please schedule at anytime with [facility] Nurse Health Advisor 1 or 2   This should be a 45 minute visit. 

## 2020-04-07 DIAGNOSIS — M19021 Primary osteoarthritis, right elbow: Secondary | ICD-10-CM | POA: Diagnosis not present

## 2020-04-07 DIAGNOSIS — Z4789 Encounter for other orthopedic aftercare: Secondary | ICD-10-CM | POA: Diagnosis not present

## 2020-04-08 DIAGNOSIS — Z1152 Encounter for screening for COVID-19: Secondary | ICD-10-CM | POA: Diagnosis not present

## 2020-04-08 DIAGNOSIS — Z03818 Encounter for observation for suspected exposure to other biological agents ruled out: Secondary | ICD-10-CM | POA: Diagnosis not present

## 2020-04-28 ENCOUNTER — Ambulatory Visit (HOSPITAL_COMMUNITY)
Admission: RE | Admit: 2020-04-28 | Discharge: 2020-04-28 | Disposition: A | Discharge status: Home / Self Care | Payer: PPO | Source: Ambulatory Visit | Attending: Cardiology | Admitting: Cardiology

## 2020-04-28 ENCOUNTER — Ambulatory Visit (HOSPITAL_BASED_OUTPATIENT_CLINIC_OR_DEPARTMENT_OTHER)
Admission: RE | Admit: 2020-04-28 | Discharge: 2020-04-28 | Disposition: A | Discharge status: Home / Self Care | Payer: PPO | Source: Ambulatory Visit | Attending: Cardiology | Admitting: Cardiology

## 2020-04-28 ENCOUNTER — Other Ambulatory Visit: Payer: Self-pay

## 2020-04-28 VITALS — BP 130/78 | HR 57 | Ht 66.5 in | Wt 271.0 lb

## 2020-04-28 DIAGNOSIS — Z8249 Family history of ischemic heart disease and other diseases of the circulatory system: Secondary | ICD-10-CM | POA: Diagnosis not present

## 2020-04-28 DIAGNOSIS — I2510 Atherosclerotic heart disease of native coronary artery without angina pectoris: Secondary | ICD-10-CM | POA: Diagnosis not present

## 2020-04-28 DIAGNOSIS — Z7982 Long term (current) use of aspirin: Secondary | ICD-10-CM | POA: Diagnosis not present

## 2020-04-28 DIAGNOSIS — Z8551 Personal history of malignant neoplasm of bladder: Secondary | ICD-10-CM | POA: Diagnosis not present

## 2020-04-28 DIAGNOSIS — E785 Hyperlipidemia, unspecified: Secondary | ICD-10-CM | POA: Diagnosis not present

## 2020-04-28 DIAGNOSIS — Z87891 Personal history of nicotine dependence: Secondary | ICD-10-CM | POA: Insufficient documentation

## 2020-04-28 DIAGNOSIS — G4733 Obstructive sleep apnea (adult) (pediatric): Secondary | ICD-10-CM | POA: Insufficient documentation

## 2020-04-28 DIAGNOSIS — I350 Nonrheumatic aortic (valve) stenosis: Secondary | ICD-10-CM | POA: Insufficient documentation

## 2020-04-28 DIAGNOSIS — I5032 Chronic diastolic (congestive) heart failure: Secondary | ICD-10-CM

## 2020-04-28 DIAGNOSIS — Z7901 Long term (current) use of anticoagulants: Secondary | ICD-10-CM | POA: Diagnosis not present

## 2020-04-28 DIAGNOSIS — Z79899 Other long term (current) drug therapy: Secondary | ICD-10-CM | POA: Insufficient documentation

## 2020-04-28 DIAGNOSIS — I110 Hypertensive heart disease with heart failure: Secondary | ICD-10-CM | POA: Diagnosis not present

## 2020-04-28 DIAGNOSIS — I712 Thoracic aortic aneurysm, without rupture: Secondary | ICD-10-CM | POA: Diagnosis not present

## 2020-04-28 LAB — BASIC METABOLIC PANEL
Anion gap: 11 (ref 5–15)
BUN: 15 mg/dL (ref 8–23)
CO2: 24 mmol/L (ref 22–32)
Calcium: 9 mg/dL (ref 8.9–10.3)
Chloride: 102 mmol/L (ref 98–111)
Creatinine, Ser: 0.99 mg/dL (ref 0.61–1.24)
GFR, Estimated: >60 mL/min (ref >60)
Glucose, Bld: 94 mg/dL (ref 70–99)
Potassium: 3.8 mmol/L (ref 3.5–5.1)
Sodium: 137 mmol/L (ref 135–145)

## 2020-04-28 LAB — ECHOCARDIOGRAM COMPLETE
AR max vel: 1.26 cm2
AV Area VTI: 1.6 cm2
AV Area mean vel: 1.46 cm2
AV Mean grad: 32 mmHg
AV Peak grad: 60.2 mmHg
Ao pk vel: 3.88 m/s
Area-P 1/2: 1.91 cm2
S' Lateral: 2.3 cm

## 2020-04-28 LAB — LIPID PANEL
Cholesterol: 161 mg/dL (ref 0–200)
HDL: 52 mg/dL (ref >40)
LDL Cholesterol: 70 mg/dL (ref 0–99)
Total CHOL/HDL Ratio: 3.1 RATIO
Triglycerides: 194 mg/dL — ABNORMAL HIGH (ref <150)
VLDL: 39 mg/dL (ref 0–40)

## 2020-04-28 LAB — CBC
HCT: 42.4 % (ref 39.0–52.0)
Hemoglobin: 13.7 g/dL (ref 13.0–17.0)
MCH: 28.7 pg (ref 26.0–34.0)
MCHC: 32.3 g/dL (ref 30.0–36.0)
MCV: 88.9 fL (ref 80.0–100.0)
Platelets: 142 10*3/uL — ABNORMAL LOW (ref 150–400)
RBC: 4.77 MIL/uL (ref 4.22–5.81)
RDW: 14 % (ref 11.5–15.5)
WBC: 6.8 10*3/uL (ref 4.0–10.5)
nRBC: 0 % (ref 0.0–0.2)

## 2020-04-28 MED ORDER — LOSARTAN POTASSIUM 50 MG PO TABS
50.0000 mg | ORAL_TABLET | Freq: Two times a day (BID) | ORAL | 3 refills | Status: DC
Start: 2020-04-28 — End: 2021-02-17

## 2020-04-28 NOTE — Progress Notes (Signed)
Patient ID: Luis [name], male   DOB: 10-19-1955, 65 y.o.   MRN: [id number] PCP: Dr. Sarajane Jews Cardiology: Dr. Aundra Dubin  65 y.o. with history of CAD and moderate aortic stenosis presents for follow of AS and CAD. He had RCA PCI in 8/12.     When I saw him in early 2016, he reported significant exertional dyspnea.  I had him do a Lexiscan Cardiolite in 2/16.  This showed no ischemia or infarction.  I also had him get an echo that showed ?bicuspid aortic valve and probably moderate aortic stenosis.  Given ongoing symptoms, I had him do a RHC/LHC and TEE in 2/16.  The RHC showed mildly elevated left and right heart filling pressures and the LHC showed nonobstructive CAD.  TEE confirmed bicuspid aortic valve with moderate AS.  He had PFTs in 4/16 that were within normal limits.  I started him on Lasix 20 mg daily.  He seemed to improve.    Progressive dypnea in fall 2017.  RHC/LHC in 2017 showed mildly elevated left heart filling pressure and nonobstructive CAD.  TEE showed moderate aortic stenosis, bicuspid valve.  Stable ascending aorta dilation.   Echo in 9/18 showed EF 60-65% with bicuspid aortic valve and moderate AS.  CTA chest showed 4.5 cm ascending aorta.  Repeat CTA chest in 8/19 showed stable 4.5 cm ascending aorta.  Echo in 8/20 showed EF 60-65%, moderate AS, 4.5 cm ascending aorta. CTA chest in 8/20 with 4.6 cm ascending aorta. Echo was done today and reviewed, EF 60-65%, mild LVH, moderate AS with mean gradient 32 and AVA 1.6 cm^2, bicuspid aortic valve, ascending aorta 46 mm.   He is being treated for bladder cancer with chemotherapy and TURBT every 3 months.   He has been stable symptomatically.  Weigh is down 4 lbs.  Does some walking for exercise, [place] in the mall.  Stable dyspnea walking up a flight of stairs, no dyspnea on flat ground.  No chest pain.   Uses CPAP regularly.   Labs (2/15): K 4.4, creatinine 0.9, LDL 71, HDL 53 Labs (2/16): K 4.8, creatinine 1.0, LDL 62, HDL 57 Labs  (8/16): K 4.7, creatinine 0.95, TSH normal, HCT 41.2, LDL 78, HDL 58 Labs (8/17): K 4.3, creatinine 1.05, HCT 40.1, TSH normal, BNP 56 Labs (9/17): creatinine 1.08, BNP 56 Labs (7/18): creatinine 1.2, LDL 53 Labs (7/19): LDL 68 Labs (8/19): K 5, creatinine 1.09 Labs (8/20): K 4.9, creatinine 1.09, LDL 59, HDL 50 Labs (8/21): K 4.6, creatinine 1.0  ECG (personally reviewed): NSR, LAFB  PMH: 1. OSA: Using CPAP.  2. CAD: Cardiolite 8/12 with inferior infarct and peri-infarct ischemia, had PCI to the mid and distal RCA with Promus DES x 2.  Lexiscan Cardiolite (2/16) with no ischemia or infarction. LHC/RHC (2/16) with mean RA 12, PA 32/15, mean PCWP 18, CI 3.47; patent mid and distal RCA stents, 50-60% proximal stenosis small PDA.   - LHC/RHC (9/17): 60% proximal PDA; mean RA 2, PA 24/8, mean PCWP 18, CI 2.13.  - Cardiolite (8/17): EF 56%, normal.  3. Carotid stenosis: 2/15 carotid dopplers 1-39% bilateral ICA stenosis.  4. Colon polyps 5. HTN 6. Hyperlipidemia 7. Abdominal US (10/12) with no AAA 8. Aortic stenosis: Echo (2/15) with EF 60-65%, mild LVH, moderate diastolic dysfunction, normal RV size/systolic function, moderate aortic stenosis with mean gradient 31 and AVA 1.16 cm2, ascending aorta 4.4 cm.  Echo (2/16) with EF 60-65%, grade II diastolic dysfunction, ?bicuspid aortic valve with moderate AS (mean  gradient 35 mmHg, AVA 1.5 cm^2), mildly dilated RV with normal systolic function.  TEE (2/16) with bicuspid aortic valve, moderate AS mean gradient 30 mmHg, AVA > 1 cm^2, EF 55-60%, mild LVH, mildly dilated RV with normal systolic function, mild AI, mild MR, ascending aorta 4.2 cm.  - Echo (8/17): EF 60-65%, grade II diastolic dysfunction, bicuspid aortic valve with mean gradient 31 mmHg, probably moderate stenosis.  - TEE (9/17): EF 55-60%, bicuspid aortic valve with moderate AS with mean gradient 24 mmHg and AVA 1.05 cm^2, ascending aorta 4.2 cm.  - Echo (9/18): EF 60-65%, bicuspid aortic  valve, AVA 1.05 cm^2 with mean gradient 24 mmHg (moderate AS).  - Echo (8/20): EF 60-65%, mild RV dilation with normal systolic function, moderate AS with AVA 1.46 cm^2 and mean gradient 36 mmHg, 4.5 cm ascending aorta.  - EF 60-65%, mild LVH, moderate AS with mean gradient 32 and AVA 1.6 cm^2, bicuspid aortic valve, ascending aorta 46 mm.  9. Ascending aorta aneurysm: 4.4 cm on 2/15 echo. Ascending aorta 4.2 cm TEE 2/16.  Ascending aorta 4.2 cm on 9/17 TEE.  - CTA chest (9/18): 4.5 cm ascending aorta.  - CTA chest (8/19): 4.5 cm ascending aorta - CTA chest (8/20): 4.6 cm ascending aorta 10. Chronic diastolic CHF.  11. PFTs (4/16) were within normal limits.  12. Lower extremity arterial dopplers normal in 8/17.  13. Bladder cancer: S/p chemo and TURBT  SH: Quit smoking in 2013, married, works as Curator.  FH: Father with MI, AVR.  Sister with RyR2 gene.   ROS: All systems reviewed and negative except as per HPI.   Current Outpatient Medications  Medication Sig Dispense Refill  . aspirin 81 MG tablet Take 81 mg by mouth every morning.    . citalopram (CELEXA) 40 MG tablet TAKE 1 TABLET BY MOUTH EVERY DAY (Patient taking differently: Take 40 mg by mouth daily.) 90 tablet 3  . diazepam (VALIUM) 5 MG tablet TAKE 1 TABLET (5 MG TOTAL) BY MOUTH EVERY 12 (TWELVE) HOURS AS NEEDED FOR ANXIETY. 60 tablet 5  . furosemide (LASIX) 40 MG tablet TAKE 1 TABLET BY MOUTH EVERY DAY 90 tablet 3  . ibuprofen (ADVIL,MOTRIN) 200 MG tablet Take 200 mg by mouth 2 (two) times daily as needed for moderate pain.     [name] ipratropium (ATROVENT) 0.03 % nasal spray Place 2 sprays into both nostrils every 12 (twelve) hours as needed for rhinitis.    [name] MAGNESIUM PO Take 500 mg by mouth daily.    . meclizine (ANTIVERT) 25 MG tablet Take 1 tablet (25 mg total) by mouth 3 (three) times daily as needed for dizziness. 30 tablet 0  . nitroGLYCERIN (NITROSTAT) 0.4 MG SL tablet Place 1 tablet (0.4 mg total) under the tongue every  5 (five) minutes as needed. For chest pain. 25 tablet 2  . phenazopyridine (PYRIDIUM) 200 MG tablet Take 1 tablet (200 mg total) by mouth 3 (three) times daily as needed for pain. 20 tablet 0  . rosuvastatin (CRESTOR) 20 MG tablet TAKE 1 TABLET BY MOUTH EVERYDAY AT BEDTIME 90 tablet 1  . tamsulosin (FLOMAX) 0.4 MG CAPS capsule Take 0.4 mg by mouth daily.    [name] losartan (COZAAR) 50 MG tablet Take 1 tablet (50 mg total) by mouth in the morning and at bedtime. 180 tablet 3   No current facility-administered medications for this encounter.   BP 130/78   Pulse (!) 57   Ht 5' 6.5" (1.689 m)   [employer]  122.9 kg (271 lb)   SpO2 95%   BMI 43.09 kg/m  General: NAD Neck: No JVD, no thyromegaly or thyroid nodule.  Lungs: Clear to auscultation bilaterally with normal respiratory effort. CV: Nondisplaced PMI.  Heart regular S1/S2, no S3/S4, 2/6 SEM RUSB with clear S2.  1+ ankle edema.  No carotid bruit.  Normal pedal pulses.  Abdomen: Soft, nontender, no hepatosplenomegaly, no distention.  Skin: Intact without lesions or rashes.  Neurologic: Alert and oriented x 3.  Psych: Normal affect. Extremities: No clubbing or cyanosis.  HEENT: Normal.   Assessment/Plan: 1. CAD: Prior PCI to RCA in 8/12.  LHC (9/17) with nonobstructive disease.  I had started him on Imdur for ?microvascular angina but he developed severe headaches without any improvement in symptoms.  No chest pain, stable exertional dyspnea.  - Continue ASA 81 and Crestor.  2. Aortic stenosis: Bicuspid valve with moderate AS by echo done today (stable).  He has dyspnea walking up a hill.     3. Hyperlipidemia: Check lipids today.  4. Ascending aortic aneurysm: Associated with bicuspid aortic valve.  4.6 cm ascending aorta on echo today.  - I will arrange for CTA chest for full assessment of ascending aortic aneurysm.   5. Chronic diastolic CHF:  He is euvolemic on exam, NYHA class II.   - Continue Lasix 40 mg daily.  - BMET today.  6.  OSA:  Continue CPAP.  7. HTN: With ascending aortic aneurysm, need good BP control.   - Increase losartan to 50 mg bid with BMET 10 days.      Followup in 6 months  Marca Ancona 04/28/2020

## 2020-04-28 NOTE — Patient Instructions (Addendum)
INCREASE Losartan 50mg  (1 tablet) twice daily  Labs done today, your results will be available in MyChart, we will contact you for abnormal readings.  Your physician recommends that you schedule repeat labs in 2 weeks  Please call our office in June 2022 to schedule your follow up appointment  If you have any questions or concerns before your next appointment please send July 2022 a message through [place] or call our office at [phone].    TO LEAVE A MESSAGE FOR THE NURSE SELECT OPTION 2, PLEASE LEAVE A MESSAGE INCLUDING: . YOUR NAME . DATE OF BIRTH . CALL BACK NUMBER . REASON FOR CALL**this is important as we prioritize the call backs  YOU WILL RECEIVE A CALL BACK THE SAME DAY AS LONG AS YOU CALL BEFORE 4:00 PM

## 2020-04-28 NOTE — Progress Notes (Signed)
Echocardiogram 2D Echocardiogram has been performed.  Luis [name] 04/28/2020, 8:58 AM

## 2020-05-09 ENCOUNTER — Other Ambulatory Visit: Payer: Self-pay | Admitting: Family Medicine

## 2020-05-11 ENCOUNTER — Other Ambulatory Visit (HOSPITAL_COMMUNITY): Payer: PPO

## 2020-05-14 ENCOUNTER — Ambulatory Visit (HOSPITAL_COMMUNITY)
Admission: RE | Admit: 2020-05-14 | Discharge: 2020-05-14 | Disposition: A | Discharge status: Home / Self Care | Payer: PPO | Source: Ambulatory Visit | Attending: Cardiology | Admitting: Cardiology

## 2020-05-14 ENCOUNTER — Other Ambulatory Visit: Payer: Self-pay

## 2020-05-14 DIAGNOSIS — I5032 Chronic diastolic (congestive) heart failure: Secondary | ICD-10-CM | POA: Insufficient documentation

## 2020-05-14 LAB — BASIC METABOLIC PANEL WITH GFR
Anion gap: 9 (ref 5–15)
BUN: 14 mg/dL (ref 8–23)
CO2: 27 mmol/L (ref 22–32)
Calcium: 9 mg/dL (ref 8.9–10.3)
Chloride: 103 mmol/L (ref 98–111)
Creatinine, Ser: 1.02 mg/dL (ref 0.61–1.24)
GFR, Estimated: >60 mL/min
Glucose, Bld: 118 mg/dL — ABNORMAL HIGH (ref 70–99)
Potassium: 4 mmol/L (ref 3.5–5.1)
Sodium: 139 mmol/L (ref 135–145)

## 2020-06-10 ENCOUNTER — Telehealth: Payer: Self-pay | Admitting: Pharmacist

## 2020-06-10 NOTE — Chronic Care Management (AMB) (Signed)
I left the patient a message about his upcoming appointment on 06/11/2020 @ 3:30 PM with the clinical pharmacist. He was asked to please have all medication on hand to review with the pharmacist.  Neita Goodnight) Mare Ferrari, [place] Assistant [phone]

## 2020-06-11 ENCOUNTER — Ambulatory Visit (INDEPENDENT_AMBULATORY_CARE_PROVIDER_SITE_OTHER): Payer: PPO | Admitting: Pharmacist

## 2020-06-11 DIAGNOSIS — I10 Essential (primary) hypertension: Secondary | ICD-10-CM | POA: Diagnosis not present

## 2020-06-11 DIAGNOSIS — E785 Hyperlipidemia, unspecified: Secondary | ICD-10-CM

## 2020-06-11 NOTE — Progress Notes (Signed)
Chronic Care Management Pharmacy Note  06/11/2020 Name:  Luis [name] MRN:  [id number] DOB:  1955/05/19  Subjective: Luis [name] is an 65 y.o. year old male who is a primary patient of Laurey Morale, MD.  The CCM team was consulted for assistance with disease management and care coordination needs.    Engaged with patient by telephone for follow up visit in response to provider referral for pharmacy case management and/or care coordination services.   Consent to Services:  The patient was given information about Chronic Care Management services, agreed to services, and gave verbal consent prior to initiation of services.  Please see initial visit note for detailed documentation.   Patient Care Team: Laurey Morale, MD as PCP - General Sueanne Margarita, MD as PCP - Sleep Medicine (Cardiology) Thompson Grayer, MD as Consulting Physician (Cardiology) [name], [facility] as Pharmacist (Pharmacist)  Recent office visits: 08/08/19 [name], MD: Patient presented with dark urine and low back pain and mild pain in testicles. Suspected prostatitis and prescribed Cipro and recommended lots of water.  Recent consult visits: 04/28/20 [name], MD (cardiology): Patient presented for CHF follow up. Increased losartan to 50 mg BID.  04/07/20 [name] (emergeortho): Patient presented for osteoarthritis follow up.   02/25/20 [name] (OT): Patient presented for pain in right elbow. Unable to access notes.  02/07/20 [name] (urology): Patient presented for follow up. Unable to access notes.   Hospital visits: 12/13/19 - Patient was admitted for transurethral resection of bladder tumor.  Objective:  Lab Results  Component Value Date   CREATININE 1.02 05/14/2020   BUN 14 05/14/2020   GFR 85.71 11/26/2014   GFRNONAA >60 05/14/2020   GFRAA >60 08/10/2019   NA 139 05/14/2020   K 4.0 05/14/2020   CALCIUM 9.0 05/14/2020   CO2 27 05/14/2020    Lab Results   Component Value Date/Time   GFR 85.71 11/26/2014 09:11 AM   GFR 73.22 07/21/2014 04:14 PM    Last diabetic Eye exam: No results found for: HMDIABEYEEXA  Last diabetic Foot exam: No results found for: HMDIABFOOTEX   Lab Results  Component Value Date   CHOL 161 04/28/2020   HDL 52 04/28/2020   LDLCALC 70 04/28/2020   LDLDIRECT 145.6 03/16/2007   TRIG 194 (H) 04/28/2020   CHOLHDL 3.1 04/28/2020    Hepatic Function Latest Ref Rng & Units 08/10/2019 11/26/2014 06/20/2014  Total Protein 6.5 - 8.1 g/dL 7.1 7.1 7.2  Albumin 3.5 - 5.0 g/dL 3.8 4.3 4.2  AST 15 - 41 U/L '22 19 16  ' ALT 0 - 44 U/L '30 21 21  ' Alk Phosphatase 38 - 126 U/L 70 82 81  Total Bilirubin 0.3 - 1.2 mg/dL 1.1 1.0 0.9  Bilirubin, Direct 0.0 - 0.3 mg/dL - 0.2 0.2    Lab Results  Component Value Date/Time   TSH 0.58 11/27/2015 03:07 PM   TSH 0.91 11/26/2014 09:11 AM   FREET4 0.79 01/10/2012 08:32 AM    CBC Latest Ref Rng & Units 04/28/2020 12/13/2019 10/21/2019  WBC 4.0 - 10.5 K/uL 6.8 - -  Hemoglobin 13.0 - 17.0 g/dL 13.7 13.6 13.3  Hematocrit 39.0 - 52.0 % 42.4 40.0 39.0  Platelets 150 - 400 K/uL 142(L) - -    No results found for: VD25OH  Clinical ASCVD: Yes  The 10-year ASCVD risk score Mikey Bussing DC Jr., et al., 2013) is: 13.9%   Values used to calculate the score:  Age: 79 years     Sex: Male     Is Non-Hispanic African American: No     Diabetic: No     Tobacco smoker: No     Systolic Blood Pressure: 326 mmHg     Is BP treated: Yes     HDL Cholesterol: 52 mg/dL     Total Cholesterol: 161 mg/dL    No flowsheet data found.   Social History   Tobacco Use  Smoking Status Former Smoker  . Packs/day: 2.00  . Years: 40.00  . Pack years: 80.00  . Types: Cigarettes  . Quit date: 2012  . Years since quitting: 10.1  Smokeless Tobacco Never Used   BP Readings from Last 3 Encounters:  04/28/20 130/78  12/13/19 130/79  10/21/19 125/77   Pulse Readings from Last 3 Encounters:  04/28/20 (!) 57  12/13/19  61  10/21/19 76   Wt Readings from Last 3 Encounters:  04/28/20 271 lb (122.9 kg)  12/13/19 268 lb 4.8 oz (121.7 kg)  10/21/19 269 lb 9.6 oz (122.3 kg)    Assessment/Interventions: Review of patient past medical history, allergies, medications, health status, including review of consultants reports, laboratory and other test data, was performed as part of comprehensive evaluation and provision of chronic care management services.   SDOH:  (Social Determinants of Health) assessments and interventions performed: No   CCM Care Plan  No Known Allergies  Medications Reviewed Today    Reviewed by Enid Derry, CMA (Certified Medical Assistant) on 04/28/20 at [phone]  Med List Status: <None>  Medication Order Taking? Sig Documenting Provider Last Dose Status Informant  aspirin 81 MG tablet [id number] Yes Take 81 mg by mouth every morning. [provider] Taking Active Self  citalopram (CELEXA) 40 MG tablet [id number] Yes TAKE 1 TABLET BY MOUTH EVERY DAY  Patient taking differently: Take 40 mg by mouth daily.   Laurey Morale, MD Taking Active Self  diazepam (VALIUM) 5 MG tablet [id number] Yes TAKE 1 TABLET (5 MG TOTAL) BY MOUTH EVERY 12 (TWELVE) HOURS AS NEEDED FOR ANXIETY. Laurey Morale, MD Taking Active   furosemide (LASIX) 40 MG tablet [id number] Yes TAKE 1 TABLET BY MOUTH EVERY DAY Larey Dresser, MD Taking Active Self  ibuprofen (ADVIL,MOTRIN) 200 MG tablet [id number] Yes Take 200 mg by mouth 2 (two) times daily as needed for moderate pain.  [provider] Taking Active Self  ipratropium (ATROVENT) 0.03 % nasal spray [id number] Yes Place 2 sprays into both nostrils every 12 (twelve) hours as needed for rhinitis. [provider] Taking Active Self  losartan (COZAAR) 50 MG tablet [id number] Yes TAKE 1 TABLET BY MOUTH EVERY DAY Larey Dresser, MD Taking Active Self  MAGNESIUM PO [id number] Yes Take 500 mg by mouth daily. [provider] Taking Active Self   meclizine (ANTIVERT) 25 MG tablet [id number] Yes Take 1 tablet (25 mg total) by mouth 3 (three) times daily as needed for dizziness. Laurey Morale, MD Taking Active Self  nitroGLYCERIN (NITROSTAT) 0.4 MG SL tablet [id number] Yes Place 1 tablet (0.4 mg total) under the tongue every 5 (five) minutes as needed. For chest pain. Larey Dresser, MD Taking Active Self  phenazopyridine (PYRIDIUM) 200 MG tablet [id number] Yes Take 1 tablet (200 mg total) by mouth 3 (three) times daily as needed for pain. Kathie Rhodes, MD Taking Active   rosuvastatin (CRESTOR) 20 MG tablet [id number] Yes TAKE 1 TABLET BY MOUTH EVERYDAY AT BEDTIME Larey Dresser, MD Taking  Active   tamsulosin (FLOMAX) 0.4 MG CAPS capsule [id number] Yes Take 0.4 mg by mouth daily. [provider] Taking Active           Patient Active Problem List   Diagnosis Date Noted  . Morbid obesity ([place]) 02/29/2016  . Essential hypertension   . Ascending aortic aneurysm ([place])   . Rhinitis, chronic 01/14/2015  . Generalized anxiety disorder 01/14/2015  . Aortic stenosis, moderate 01/02/2015  . Chronic diastolic CHF (congestive heart failure) ([place]) 06/30/2014  . OSA (obstructive sleep apnea) 02/13/2012  . Fatigue 01/09/2012  . Gall bladder disease 06/16/2011  . Hyperlipidemia 02/28/2011  . Angina of effort ([place]) 12/08/2010  . CAD (coronary artery disease) 12/08/2010  . Tobacco abuse 12/08/2010  . Aortic stenosis 12/08/2010  . ANAL FISSURE 04/23/2009  . ACUTE PROSTATITIS 08/01/2008  . INTERNAL HEMORRHOIDS 05/22/2008  . COLONIC POLYPS 02/07/2008  . CAROTID ARTERY STENOSIS 02/07/2008  . ANXIETY 01/09/2007    Immunization History  Administered Date(s) Administered  . Influenza Whole 03/23/2007  . PFIZER(Purple Top)SARS-COV-2 Vaccination 06/30/2019, 07/30/2019    Conditions to be addressed/monitored:  Hypertension, Hyperlipidemia, Heart Failure, Coronary Artery Disease, Anxiety and chronic rhinitis, vertigo, pain  There are  no care plans that you recently modified to display for this patient.     Medication Assistance: None required.  Patient affirms current coverage meets needs.  Patient's preferred pharmacy is:  [id number] [place] [address] [address]Phone: [phone]: [phone] [address]Phone: [phone]: [phone] [id number] [address] [place]one: [phone]: [phone] pill box? No - uses a rack Pt endorses 100% compliance  We discussed: Benefits of medication synchronization, packaging and delivery as well as enhanced pharmacist oversight with [employer]. Patient decided to: Continue current medication management strategy  Care Plan and Follow Up Patient Decision:  Patient agrees to Care Plan and Follow-up.  Plan: Telephone follow up appointment with care management team member scheduled for:  4-5 months  BP assessment in 2 months  MaJeni SallesPharmD [facility]Ingerharmacist Le[place]t [id number]

## 2020-06-15 ENCOUNTER — Other Ambulatory Visit: Payer: Self-pay

## 2020-06-16 ENCOUNTER — Encounter: Payer: Self-pay | Admitting: Family Medicine

## 2020-06-16 ENCOUNTER — Ambulatory Visit (INDEPENDENT_AMBULATORY_CARE_PROVIDER_SITE_OTHER): Payer: PPO | Admitting: Family Medicine

## 2020-06-16 VITALS — BP 118/72 | HR 61 | Temp 97.8°F | Ht 66.5 in | Wt 277.0 lb

## 2020-06-16 DIAGNOSIS — F411 Generalized anxiety disorder: Secondary | ICD-10-CM

## 2020-06-16 DIAGNOSIS — G4733 Obstructive sleep apnea (adult) (pediatric): Secondary | ICD-10-CM

## 2020-06-16 MED ORDER — VENLAFAXINE HCL ER 150 MG PO CP24: 150.0000 mg | ORAL_CAPSULE | Freq: Every day | ORAL | 11 refills | Status: DC

## 2020-06-16 NOTE — Progress Notes (Signed)
   Subjective:    Patient ID: Luis [name], male    DOB: 09-10-1955, 65 y.o.   MRN: [id number]  HPI Here for 2 issues. First he needs to see someone about his CPAP machine which is about 67 years old. An indicator light says something is wrong with it. He last saw Dr. Gwenette Greet for his sleep apnea. Also his anxiety has been more of a problem. He has had a hard year, what with having elbow surgery and being diagnosed with bladder cancer. In addition his son died suddenly last summer, and he is still working through this. He has been taking Celexa for years now, and he adds a Valium once in a while.    Review of Systems  Constitutional: Negative.   Respiratory: Negative.   Cardiovascular: Negative.   Psychiatric/Behavioral: Negative for agitation, behavioral problems, confusion, decreased concentration, dysphoric mood, hallucinations, self-injury, sleep disturbance and suicidal ideas. The patient is nervous/anxious.        Objective:   Physical Exam Constitutional:      Appearance: Normal appearance. He is obese.  Cardiovascular:     Rate and Rhythm: Normal rate and regular rhythm.     Pulses: Normal pulses.     Heart sounds: Normal heart sounds.  Pulmonary:     Effort: Pulmonary effort is normal.     Breath sounds: Normal breath sounds.  Neurological:     Mental Status: He is alert.  Psychiatric:        Mood and Affect: Mood normal.        Behavior: Behavior normal.        Thought Content: Thought content normal.        Judgment: Judgment normal.           Assessment & Plan:  For the sleep apnea, he will see Dr. Gala Murdoch on 07-07-20. For the anxiety, we will stop Celexa and start him on Effexor XR 150 mg daily. Recheck in 4 weeks. [name], MD

## 2020-06-22 NOTE — Patient Instructions (Addendum)
Hi Luis [name],  It was great getting to speak with you again! As we discussed, please go back to regularly checking your blood pressure weekly and I will plan to have my assistance follow up with you in a couple of months. Also, don't forget your follow up discussion about anxiety with Dr. Sarajane Jews.  Please don't hesitate to reach out to me if you have any questions or need anything from me!  Best, Maddie  [name], PharmD [facility] Clinical Pharmacist [place] [facility] [place]   Visit Information  Goals Addressed   None    Patient Care Plan: CCM Pharmacy Care Plan    Problem Identified: Problem: Hypertension, Hyperlipidemia, Heart Failure, Coronary Artery Disease, Anxiety and chronic rhinitis, vertigo, pain     Long-Range Goal: Patient-Specific Goal   Start Date: 06/11/2020  Expected End Date: 06/11/2021  This Visit's Progress: On track  Priority: High  Note:   Current Barriers:  . Unable to independently monitor therapeutic efficacy . Unable to achieve control of anxiety   Pharmacist Clinical Goal(s):  [name] Over the next 160 days, patient will achieve adherence to monitoring guidelines and medication adherence to achieve therapeutic efficacy . achieve control of blood pressure as evidenced by home blood pressure monitoring  through collaboration with PharmD and provider.   Interventions: . 1:1 collaboration with Laurey Morale, MD regarding development and update of comprehensive plan of care as evidenced by provider attestation and co-signature . Inter-disciplinary care team collaboration (see longitudinal plan of care) . Comprehensive medication review performed; medication list updated in electronic medical record  Hypertension (BP goal <130/80) -Controlled -Current treatment: . losartan 50mg , 1 tablet twice daily  -Medications previously tried: none  -Current home readings: 142/73 mmHg  -Current dietary habits: patient drinks 2 cups of coffee with  caffeine in the morning -Current exercise habits: patient reports he enjoys walking but his knees bother him and he is still trying to walk when he can -Denies hypotensive/hypertensive symptoms -Educated on Exercise goal of 150 minutes per week; Importance of home blood pressure monitoring; Symptoms of hypotension and importance of maintaining adequate hydration; -Counseled to monitor BP at home weekly, document, and provide log at future appointments -Recommended to continue current medication Counseled on substituting one cup of coffee for water  Hyperlipidemia/Coronary artery disease: (LDL goal < 70) -Controlled -Current treatment:  rosuvastatin 20mg , 1 tablet once daily at bedtime   nitroglycerin 0.4mg  SL tablet, 1 tablet under tongue every five minutes as needed for chest pain  ASA 81mg , 1 tablet daily -Medications previously tried: none  -Current dietary patterns: did not discuss -Current exercise habits: patient reports he enjoys walking but his knees bother him and he is still trying to walk when he can -Educated on Cholesterol goals;  Exercise goal of 150 minutes per week; -Recommended to continue current medication  Heart Failure (Goal: control symptoms and prevent exacerbations) -Controlled Type: Diastolic -NYHA Class: II (slight limitation of activity) -Ejection fraction: 60-65% (Date: 12/07/2018) -Current treatment:  losartan 50mg , 1 tablet twice daily   furosemide 40mg , 1 tablet once daily -Medications previously tried: none -Current home BP/HR readings: could not provide HR readings -Current dietary habits: did not discuss -Current exercise routine:  patient reports he enjoys walking but his knees bother him and he is still trying to walk when he can -Educated on Importance of weighing daily; if you gain more than 3 pounds in one day or 5 pounds in one week, contact cardiologist Importance of blood pressure control -Recommended to  continue current  medication  Anxiety (Goal: minimize symptoms of anxiety) -Uncontrolled -Current treatment:  citalopram 40mg , 1 tablet once daily  diazepam 5mg , 1 tablet every twelve hours as needed for anxiety -Medications previously tried/failed: escitalopram (cost) -GAD7: n/a -Connected with and scheduled an appointment with Dr. Sarajane Jews for mental health support -Educated on Benefits of medication for symptom control Benefits of cognitive-behavioral therapy with or without medication -Recommended to continue current medication  BPH (Goal: minimize symptoms of enlarged prostate) -Controlled -Current treatment  . tamsulosin 0.4 mg 1 capsule daily -Medications previously tried: none  -Recommended to continue current medication  Chronic rhinitis (Goal: minimize symptoms of chronic rhinitis) -Controlled -Current treatment  . ipratropium 0.03% nasal spray (Atrovent), 2 sprays into both nostrils every 12 hours as needed (per patient report, has not needed to use it in awhile) -Medications previously tried: none  -Recommended to continue current medication  Pain (Goal: minimize symptoms of pain) -Controlled -Current treatment   ibuprofen 200mg , 1 tablet two times daily as needed for moderate pain (patient notes not using for some weeks)  Hydrocodone-APAP 10-325, 1 tablet as neede -Medications previously tried: none  -Recommended  trying Voltaren gel for joint pain to avoid using ibuprofen and hydrocodone-APAP  Vertigo (Goal: minimize symptoms of vertigo) -Controlled -Current treatment  . Meclizine 25mg , 1 tablet three times daily as needed for dizziness -Medications previously tried: none  -Recommended to continue current medication   Health Maintenance -Vaccine gaps: shingles, tetanus, Pneumonia -Current therapy:  . none -Educated on Cost vs benefit of each product must be carefully weighed by individual consumer -Patient is satisfied with current therapy and denies issues  -Recommended  to continue current medication  Patient Goals/Self-Care Activities . Over the next 150 days, patient will:  - take medications as prescribed check blood pressure weekly, document, and provide at future appointments  Follow Up Plan: Telephone follow up appointment with care management team member scheduled for: 4-5 months       Patient verbalizes understanding of instructions provided today and agrees to view in [place].  Telephone follow up appointment with pharmacy team member scheduled for:4-5 months  [name], [facility]  How to Take Your Blood Pressure Blood pressure measures how strongly your blood is pressing against the walls of your arteries. Arteries are blood vessels that carry blood from your heart throughout your body. You can take your blood pressure at home with a machine. You may need to check your blood pressure at home:  To check if you have high blood pressure (hypertension).  To check your blood pressure over time.  To make sure your blood pressure medicine is working. Supplies needed:  Blood pressure machine, or monitor.  Dining room chair to sit in.  Table or desk.  Small notebook.  Pencil or pen. How to prepare Avoid these things for 30 minutes before checking your blood pressure:  Having drinks with caffeine in them, such as coffee or tea.  Drinking alcohol.  Eating.  Smoking.  Exercising. Do these things five minutes before checking your blood pressure:  Go to the bathroom and pee (urinate).  Sit in a dining chair. Do not sit in a soft couch or an armchair.  Be quiet. Do not talk. How to take your blood pressure Follow the instructions that came with your machine. If you have a digital blood pressure monitor, these may be the instructions: 1. Sit up straight. 2. Place your feet on the floor. Do not cross your ankles or legs. 3.  Rest your left arm at the level of your heart. You may rest it on a table, desk, or chair. 4. Pull up  your shirt sleeve. 5. Wrap the blood pressure cuff around the upper part of your left arm. The cuff should be 1 inch (2.5 cm) above your elbow. It is best to wrap the cuff around bare skin. 6. Fit the cuff snugly around your arm. You should be able to place only one finger between the cuff and your arm. 7. Place the cord so that it rests in the bend of your elbow. 8. Press the power button. 9. Sit quietly while the cuff fills with air and loses air. 10. Write down the numbers on the screen. 11. Wait 2-3 minutes and then repeat steps 1-10.   What do the numbers mean? Two numbers make up your blood pressure. The first number is called systolic pressure. The second is called diastolic pressure. An example of a blood pressure reading is "120 over 80" (or 120/80). If you are an adult and do not have a medical condition, use this guide to find out if your blood pressure is normal: Normal  First number: below 120.  Second number: below 80. Elevated  First number: 120-129.  Second number: below 80. Hypertension stage 1  First number: 130-139.  Second number: 80-89. Hypertension stage 2  First number: 140 or above.  Second number: 90 or above. Your blood pressure is above normal even if only the top or bottom number is above normal. Follow these instructions at home:  Check your blood pressure as often as your doctor tells you to.  Check your blood pressure at the same time every day.  Take your monitor to your next doctor's appointment. Your doctor will: ? Make sure you are using it correctly. ? Make sure it is working right.  Make sure you understand what your blood pressure numbers should be.  Tell your doctor if your medicine is causing side effects.  Keep all follow-up visits as told by your doctor. This is important. General tips:  You will need a blood pressure machine, or monitor. Your doctor can suggest a monitor. You can buy one at a drugstore or online. When  choosing one: ? Choose one with an arm cuff. ? Choose one that wraps around your upper arm. Only one finger should fit between your arm and the cuff. ? Do not choose one that measures your blood pressure from your wrist or finger. Where to find more information [name] [employer]: www.heart.org Contact a doctor if:  Your blood pressure keeps being high. Get help right away if:  Your first blood pressure number is higher than 180.  Your second blood pressure number is higher than 120. Summary  Check your blood pressure at the same time every day.  Avoid caffeine, alcohol, smoking, and exercise for 30 minutes before checking your blood pressure.  Make sure you understand what your blood pressure numbers should be. This information is not intended to replace advice given to you by your health care provider. Make sure you discuss any questions you have with your health care provider. Document Revised: 04/05/2019 Document Reviewed: 04/05/2019 Elsevier Patient Education  2021 Luis [name].

## 2020-06-23 DIAGNOSIS — Z125 Encounter for screening for malignant neoplasm of prostate: Secondary | ICD-10-CM | POA: Diagnosis not present

## 2020-06-23 LAB — PSA: PSA: 2.24

## 2020-06-30 DIAGNOSIS — Z125 Encounter for screening for malignant neoplasm of prostate: Secondary | ICD-10-CM | POA: Diagnosis not present

## 2020-06-30 DIAGNOSIS — C678 Malignant neoplasm of overlapping sites of bladder: Secondary | ICD-10-CM | POA: Diagnosis not present

## 2020-06-30 DIAGNOSIS — R338 Other retention of urine: Secondary | ICD-10-CM | POA: Diagnosis not present

## 2020-07-07 ENCOUNTER — Other Ambulatory Visit: Payer: Self-pay

## 2020-07-07 ENCOUNTER — Ambulatory Visit (INDEPENDENT_AMBULATORY_CARE_PROVIDER_SITE_OTHER): Payer: PPO | Admitting: Pulmonary Disease

## 2020-07-07 ENCOUNTER — Encounter: Payer: Self-pay | Admitting: Pulmonary Disease

## 2020-07-07 VITALS — BP 118/74 | HR 74 | Temp 97.3°F | Ht 66.0 in | Wt 278.8 lb

## 2020-07-07 DIAGNOSIS — G4733 Obstructive sleep apnea (adult) (pediatric): Secondary | ICD-10-CM

## 2020-07-07 NOTE — Progress Notes (Signed)
Luis [name]    [id number]    May 21, 1955  Primary Care Physician:Fry, Ishmael Holter, MD  Referring Physician: Laurey Morale, MD [place],  [place] [address]  Chief complaint:   Patient with a history of obstructive sleep apnea, machine is dated and becoming dysfunctional  HPI:  Patient with obstructive sleep apnea Very compliant with CPAP use Uses his CPAP nightly Continues to benefit from CPAP use on a nightly basis  Previous symptoms included witnessed apneas, severe snoring  Does have a history of hypertension, heart attack, coronary artery disease, heart failure, heart rhythm problems  Usually goes to bed between 830 and 9 Takes him about 5 to 10 minutes to fall asleep 45 awakenings Final wake up time about 7 AM  Weight has gone up significantly since he quit smoking in 2012  No pertinent occupational history No family history of obstructive sleep apnea   Outpatient Encounter Medications as of 07/07/2020  Medication Sig  . aspirin 81 MG tablet Take 81 mg by mouth every morning.  . diazepam (VALIUM) 5 MG tablet TAKE 1 TABLET (5 MG TOTAL) BY MOUTH EVERY 12 (TWELVE) HOURS AS NEEDED FOR ANXIETY.  . furosemide (LASIX) 40 MG tablet TAKE 1 TABLET BY MOUTH EVERY DAY  . ibuprofen (ADVIL,MOTRIN) 200 MG tablet Take 200 mg by mouth 2 (two) times daily as needed for moderate pain.   [name] ipratropium (ATROVENT) 0.03 % nasal spray Place 2 sprays into both nostrils every 12 (twelve) hours as needed for rhinitis.  [name] losartan (COZAAR) 50 MG tablet Take 1 tablet (50 mg total) by mouth in the morning and at bedtime.  [name] MAGNESIUM PO Take 500 mg by mouth daily.  . meclizine (ANTIVERT) 25 MG tablet Take 1 tablet (25 mg total) by mouth 3 (three) times daily as needed for dizziness.  . nitroGLYCERIN (NITROSTAT) 0.4 MG SL tablet Place 1 tablet (0.4 mg total) under the tongue every 5 (five) minutes as needed. For chest pain.  . phenazopyridine (PYRIDIUM) 200 MG  tablet Take 1 tablet (200 mg total) by mouth 3 (three) times daily as needed for pain.  . rosuvastatin (CRESTOR) 20 MG tablet TAKE 1 TABLET BY MOUTH EVERYDAY AT BEDTIME  . tamsulosin (FLOMAX) 0.4 MG CAPS capsule Take 0.4 mg by mouth daily.  [name] venlafaxine XR (EFFEXOR XR) 150 MG 24 hr capsule Take 1 capsule (150 mg total) by mouth daily with breakfast.   No facility-administered encounter medications on file as of 07/07/2020.    Allergies as of 07/07/2020 - Review Complete 07/07/2020  Allergen Reaction Noted  . Oxycodone  06/16/2020    Past Medical History:  Diagnosis Date  . Anxiety    takes Valium as needed  . Aortic stenosis, moderate   . Arthritis   . Ascending aortic aneurysm ([place])   . CAD (coronary artery disease)    a. s/p PCI of the RCA 8/12 with DES by Dr Burt Knack, preserved EF. b. LHC/RHC (2/16) with mean RA 12, PA 32/15, mean PCWP 18, CI 3.47; patent mid and distal RCA stents, 50-60% proximal stenosis small PDA.   . Carotid stenosis    a. Carotid US (05/2013):  Bilateral 1-39% ICA; L thyroid nodule (prior hx of aspiration).  . Chronic diastolic CHF (congestive heart failure) ([place])   . Depression   . Essential hypertension   . GERD (gastroesophageal reflux disease)    if needed will take OTC meds   . Heart murmur   .  History of colonic polyps    hyperplastic  . Hyperlipidemia   . Joint pain   . Lesion of bladder   . Myocardial infarction ([place]) 2012  . Obesity (BMI 30-39.9) 02/29/2016  . Restless leg   . Sleep apnea    uses cpap  . Tubular adenoma of colon   . Vertigo    takes Meclizine as needed    Past Surgical History:  Procedure Laterality Date  . CATARACT EXTRACTION   4 YRS AGO   BOTH EYES  . CHOLECYSTECTOMY  07/21/2011   Procedure: LAPAROSCOPIC CHOLECYSTECTOMY WITH INTRAOPERATIVE CHOLANGIOGRAM;  Surgeon: Shann Medal, MD;  Location: [facility];  Service: General;  Laterality: N/A;  . CORONARY ANGIOPLASTY  2012   2 stents  . coronary stenting     s/p PCI of  the RCA by Dr Burt Knack 8/12 with 2 promus stents  . CYSTOSCOPY W/ RETROGRADES Bilateral 12/13/2019   Procedure: CYSTOSCOPY WITH RETROGRADE PYELOGRAM;  Surgeon: Alexis Frock, MD;  [facility];  Service: Urology;  Laterality: Bilateral;  . LEFT AND RIGHT HEART CATHETERIZATION WITH CORONARY ANGIOGRAM N/A 06/23/2014   Procedure: LEFT AND RIGHT HEART CATHETERIZATION WITH CORONARY ANGIOGRAM;  Surgeon: Larey Dresser, MD;  Location: [facility] CATH LAB;  Service: Cardiovascular;  Laterality: N/A;  . NECK SURGERY  03/23/09   per Dr. Lorin Mercy, cervical fusion   . right elbow surgery    . solonscopy  05/23/08   per Dr. Wynona Luna hemorrhoids only, repeat in 5 years  . TEE WITHOUT CARDIOVERSION N/A 06/23/2014   Procedure: TRANSESOPHAGEAL ECHOCARDIOGRAM (TEE);  Surgeon: Larey Dresser, MD;  Location: [place];  Service: Cardiovascular;  Laterality: N/A;  . TEE WITHOUT CARDIOVERSION N/A 01/21/2016   Procedure: TRANSESOPHAGEAL ECHOCARDIOGRAM (TEE);  Surgeon: Larey Dresser, MD;  Location: [place];  Service: Cardiovascular;  Laterality: N/A;  . TONSILLECTOMY    . TRANSURETHRAL RESECTION OF BLADDER TUMOR N/A 10/21/2019   Procedure: TRANSURETHRAL RESECTION OF BLADDER TUMOR (TURBT);  Surgeon: Kathie Rhodes, MD;  Location[facility];  Service: Urology;  Laterality: N/A;  . TRANSURETHRAL RESECTION OF BLADDER TUMOR N/A 12/13/2019   Procedure: TRANSURETHRAL RESECTION OF BLADDER TUMOR (TURBT);  Surgeon: Alexis Frock, MD;  Location: [facility];  Service: Urology;  Laterality: N/A;  1 HR    Family History  Problem Relation Age of Onset  . Lung cancer Mother        lung  . Esophageal cancer Cousin   . Colon cancer Neg Hx   . Rectal cancer Neg Hx   . Stomach cancer Neg Hx     Social History   Socioeconomic History  . Marital status: Married    Spouse name: Not on file  . Number of children: 4  . Years of education: Not on file  . Highest  education level: Not on file  Occupational History  . Occupation: Designer, multimedia: Programmer, systems  Tobacco Use  . Smoking status: Former Smoker    Packs/day: 2.00    Years: 40.00    Pack years: 80.00    Types: Cigarettes    Quit date: 2012    Years since quitting: 10.2  . Smokeless tobacco: Never Used  Vaping Use  . Vaping Use: Never used  Substance and Sexual Activity  . Alcohol use: No    Alcohol/week: 0.0 standard drinks  . Drug use: No  . Sexual activity: Not on file  Other Topics Concern  . Not  on file  Social History Narrative  . Not on file   Social Determinants of Health   Financial Resource Strain: Not on file  Food Insecurity: Not on file  Transportation Needs: Not on file  Physical Activity: Not on file  Stress: Not on file  Social Connections: Not on file  Intimate Partner Violence: Not on file    Review of Systems  Respiratory: Positive for apnea.   Psychiatric/Behavioral: Positive for sleep disturbance.    Vitals:   07/07/20 1006  BP: 118/74  Pulse: 74  Temp: (!) 97.3 F (36.3 C)  SpO2: 97%     Physical Exam Constitutional:      Appearance: He is obese.  HENT:     Head: Normocephalic.     Mouth/Throat:     Mouth: Mucous membranes are moist.     Comments: Mallampati 4, crowded oropharynx Cardiovascular:     Rate and Rhythm: Normal rate and regular rhythm.     Heart sounds: No murmur heard. No friction rub.  Pulmonary:     Effort: No respiratory distress.     Breath sounds: No stridor. No wheezing or rhonchi.  Musculoskeletal:     Cervical back: No rigidity or tenderness.  Neurological:     Mental Status: He is alert.  Psychiatric:        Mood and Affect: Mood normal.   Epworth of 12  Data Reviewed:  Sleep study reviewed showing he was titrated to CPAP of 20 on 06/12/2016  Assessment:  Obstructive sleep apnea -Compliant with CPAP use -Will not sleep without his CPAP -Machine is becoming  dysfunctional  Obesity  Coronary artery disease  Plan/Recommendations: We will schedule patient for home sleep study  Encourage weight loss efforts  Regular exercise and diet recommended  Continue using current machine  Encouraged to give [place] a call if he has any significant concerns   Sherrilyn Rist MD [facility] 07/07/2020, 10:29 AM  CC: Laurey Morale, MD

## 2020-07-07 NOTE — Patient Instructions (Signed)
History of obstructive sleep apnea Machine is dated  We will schedule you for home sleep study to confirm that you still have significant sleep apnea We will update your results as soon as reviewed  Contact the medical supply company to set you up with a new CPAP  I will follow-up with you in about 3-4 months  Continue weight loss efforts  Call with significant concerns

## 2020-07-08 ENCOUNTER — Other Ambulatory Visit: Payer: Self-pay | Admitting: Family Medicine

## 2020-07-21 ENCOUNTER — Other Ambulatory Visit: Payer: Self-pay

## 2020-07-21 ENCOUNTER — Ambulatory Visit: Payer: PPO

## 2020-07-21 DIAGNOSIS — G4733 Obstructive sleep apnea (adult) (pediatric): Secondary | ICD-10-CM

## 2020-07-22 DIAGNOSIS — G4733 Obstructive sleep apnea (adult) (pediatric): Secondary | ICD-10-CM | POA: Diagnosis not present

## 2020-07-31 ENCOUNTER — Telehealth: Payer: Self-pay | Admitting: Pharmacist

## 2020-07-31 NOTE — Chronic Care Management (AMB) (Signed)
    Chronic Care Management Pharmacy Assistant   Name: Luis [name]  MRN: [id number] DOB: [birth year]  Reason for Encounter: Disease State   Conditions to be addressed/monitored: HTN  Recent office visits:  . 02.22.2022 Laurey Morale, MD Family Medicine o Venlafaxine HCl 150 mg Oral Daily with breakfast o Citalopram Hydrobromide 40 mg -Discontinued   Recent consult visits:  . 03.15.2022 Laurin Coder, MD Pulmonary Disease  Hospital visits:  None in previous 6 months  Medications: Outpatient Encounter Medications as of 07/31/2020  Medication Sig  . aspirin 81 MG tablet Take 81 mg by mouth every morning.  . diazepam (VALIUM) 5 MG tablet TAKE 1 TABLET (5 MG TOTAL) BY MOUTH EVERY 12 (TWELVE) HOURS AS NEEDED FOR ANXIETY.  . furosemide (LASIX) 40 MG tablet TAKE 1 TABLET BY MOUTH EVERY DAY  . ibuprofen (ADVIL,MOTRIN) 200 MG tablet Take 200 mg by mouth 2 (two) times daily as needed for moderate pain.   [name] ipratropium (ATROVENT) 0.03 % nasal spray Place 2 sprays into both nostrils every 12 (twelve) hours as needed for rhinitis.  [name] losartan (COZAAR) 50 MG tablet Take 1 tablet (50 mg total) by mouth in the morning and at bedtime.  [name] MAGNESIUM PO Take 500 mg by mouth daily.  . meclizine (ANTIVERT) 25 MG tablet Take 1 tablet (25 mg total) by mouth 3 (three) times daily as needed for dizziness.  . nitroGLYCERIN (NITROSTAT) 0.4 MG SL tablet Place 1 tablet (0.4 mg total) under the tongue every 5 (five) minutes as needed. For chest pain.  . phenazopyridine (PYRIDIUM) 200 MG tablet Take 1 tablet (200 mg total) by mouth 3 (three) times daily as needed for pain.  . rosuvastatin (CRESTOR) 20 MG tablet TAKE 1 TABLET BY MOUTH EVERYDAY AT BEDTIME  . tamsulosin (FLOMAX) 0.4 MG CAPS capsule Take 0.4 mg by mouth daily.  [name] venlafaxine XR (EFFEXOR-XR) 150 MG 24 hr capsule TAKE 1 CAPSULE BY MOUTH DAILY WITH BREAKFAST.   No facility-administered encounter medications on file as of 07/31/2020.   Reviewed  chart prior to disease state call. Spoke with patient regarding BP   Attempted on several occasions to contact patient for hypertension adherence call. Patient has not returned calls.  Star Rating Drugs:  Dispensed Quantity Pharmacy  Rosuvastatin 20 mg 02.26.2022 [facility]      [name], [place] [phone]

## 2020-08-03 ENCOUNTER — Other Ambulatory Visit: Payer: Self-pay | Admitting: Family Medicine

## 2020-08-03 DIAGNOSIS — G4733 Obstructive sleep apnea (adult) (pediatric): Secondary | ICD-10-CM

## 2020-08-04 ENCOUNTER — Telehealth: Payer: Self-pay | Admitting: Pulmonary Disease

## 2020-08-04 DIAGNOSIS — G4733 Obstructive sleep apnea (adult) (pediatric): Secondary | ICD-10-CM

## 2020-08-04 NOTE — Telephone Encounter (Signed)
Call patient  Sleep study result  Date of study: 07/21/2020  Impression: Severe obstructive sleep apnea Moderate oxygen desaturations  Recommendation: DME referral  Recommend CPAP therapy for severe obstructive sleep apnea  Auto titrating CPAP with pressure settings of 5-20 will be appropriate  Encourage weight loss measures  Follow-up in the office 4 to 6 weeks following initiation of treatment

## 2020-08-04 NOTE — Telephone Encounter (Signed)
Spoke with pt and notified of results per Dr. Ander Slade. Pt verbalized understanding and denied any questions. He was agreeable to CPAP. I made him aware of backorder issue and to call for 31-90 day rov once he gets his machine.

## 2020-08-12 ENCOUNTER — Other Ambulatory Visit: Payer: Self-pay

## 2020-08-12 ENCOUNTER — Ambulatory Visit (INDEPENDENT_AMBULATORY_CARE_PROVIDER_SITE_OTHER): Payer: PPO

## 2020-08-12 DIAGNOSIS — Z0000 Encounter for general adult medical examination without abnormal findings: Secondary | ICD-10-CM

## 2020-08-12 NOTE — Progress Notes (Signed)
Virtual Visit via Telephone Note  I connected with  Luis [name] on 08/12/20 at  8:45 AM EDT by telephone and verified that I am speaking with the correct person using two identifiers.  Location: Patient: Home Provider: Office Persons participating in the virtual visit: patient/Nurse Health Advisor   I discussed the limitations, risks, security and privacy concerns of performing an evaluation and management service by telephone and the availability of in person appointments. The patient expressed understanding and agreed to proceed.  Interactive audio and video telecommunications were attempted between this nurse and patient, however failed, due to patient having technical difficulties OR patient did not have access to video capability.  We continued and completed visit with audio only.  Some vital signs may be absent or patient reported.   Willette Brace, LPN    Subjective:   Luis [name] is a 65 y.o. male who presents for an Initial Medicare Annual Wellness Visit.  Review of Systems     Cardiac Risk Factors include: advanced age (>36men, >35 women);hypertension;dyslipidemia;male gender;obesity (BMI >30kg/m2)     Objective:    There were no vitals filed for this visit. There is no height or weight on file to calculate BMI.  Advanced Directives 08/12/2020 12/13/2019 10/21/2019 06/12/2016 04/21/2016 01/21/2016 02/02/2015  Does Patient Have a Medical Advance Directive? Yes Yes Yes Yes No Yes Yes  Type of Advance Directive Living will - Living will Living will;Healthcare Power of [place];Living will [place];Living will  Does patient want to make changes to medical advance directive? - No - Guardian declined No - Patient declined - - - -  Copy of Healthcare Power of Attorney in Chart? - - - No - copy requested - No - copy requested -  Would patient like information on creating a medical advance directive? - - - No - Patient  declined No - Patient declined - -  Pre-existing out of facility DNR order (yellow form or pink MOST form) - - - - - - -    Current Medications (verified) Outpatient Encounter Medications as of 08/12/2020  Medication Sig  . aspirin 81 MG tablet Take 81 mg by mouth every morning.  . diazepam (VALIUM) 5 MG tablet TAKE 1 TABLET (5 MG TOTAL) BY MOUTH EVERY 12 (TWELVE) HOURS AS NEEDED FOR ANXIETY.  . furosemide (LASIX) 40 MG tablet TAKE 1 TABLET BY MOUTH EVERY DAY  . ibuprofen (ADVIL,MOTRIN) 200 MG tablet Take 200 mg by mouth 2 (two) times daily as needed for moderate pain.   [name] ipratropium (ATROVENT) 0.03 % nasal spray Place 2 sprays into both nostrils every 12 (twelve) hours as needed for rhinitis.  [name] losartan (COZAAR) 50 MG tablet Take 1 tablet (50 mg total) by mouth in the morning and at bedtime.  [name] MAGNESIUM PO Take 500 mg by mouth daily.  . meclizine (ANTIVERT) 25 MG tablet Take 1 tablet (25 mg total) by mouth 3 (three) times daily as needed for dizziness.  . nitroGLYCERIN (NITROSTAT) 0.4 MG SL tablet Place 1 tablet (0.4 mg total) under the tongue every 5 (five) minutes as needed. For chest pain.  . phenazopyridine (PYRIDIUM) 200 MG tablet Take 1 tablet (200 mg total) by mouth 3 (three) times daily as needed for pain.  . rosuvastatin (CRESTOR) 20 MG tablet TAKE 1 TABLET BY MOUTH EVERYDAY AT BEDTIME  . tamsulosin (FLOMAX) 0.4 MG CAPS capsule Take 0.4 mg by mouth daily.  [name] venlafaxine XR (EFFEXOR-XR) 150 MG  24 hr capsule TAKE 1 CAPSULE BY MOUTH DAILY WITH BREAKFAST.   No facility-administered encounter medications on file as of 08/12/2020.    Allergies (verified) Oxycodone   History: Past Medical History:  Diagnosis Date  . Anxiety    takes Valium as needed  . Aortic stenosis, moderate   . Arthritis   . Ascending aortic aneurysm ([place])   . CAD (coronary artery disease)    a. s/p PCI of the RCA 8/12 with DES by Dr Burt Knack, preserved EF. b. LHC/RHC (2/16) with mean RA 12, PA 32/15, mean  PCWP 18, CI 3.47; patent mid and distal RCA stents, 50-60% proximal stenosis small PDA.   . Carotid stenosis    a. Carotid US (05/2013):  Bilateral 1-39% ICA; L thyroid nodule (prior hx of aspiration).  . Chronic diastolic CHF (congestive heart failure) ([place])   . Depression   . Essential hypertension   . GERD (gastroesophageal reflux disease)    if needed will take OTC meds   . Heart murmur   . History of colonic polyps    hyperplastic  . Hyperlipidemia   . Joint pain   . Lesion of bladder   . Myocardial infarction ([place]) 2012  . Obesity (BMI 30-39.9) 02/29/2016  . Restless leg   . Sleep apnea    uses cpap  . Tubular adenoma of colon   . Vertigo    takes Meclizine as needed   Past Surgical History:  Procedure Laterality Date  . CATARACT EXTRACTION   4 YRS AGO   BOTH EYES  . CHOLECYSTECTOMY  07/21/2011   Procedure: LAPAROSCOPIC CHOLECYSTECTOMY WITH INTRAOPERATIVE CHOLANGIOGRAM;  Surgeon: Shann Medal, MD;  Location: [facility];  Service: General;  Laterality: N/A;  . CORONARY ANGIOPLASTY  2012   2 stents  . coronary stenting     s/p PCI of the RCA by Dr Burt Knack 8/12 with 2 promus stents  . CYSTOSCOPY W/ RETROGRADES Bilateral 12/13/2019   Procedure: CYSTOSCOPY WITH RETROGRADE PYELOGRAM;  Surgeon: Alexis Frock, MD;  Location: [facility];  Service: Urology;  Laterality: Bilateral;  . LEFT AND RIGHT HEART CATHETERIZATION WITH CORONARY ANGIOGRAM N/A 06/23/2014   Procedure: LEFT AND RIGHT HEART CATHETERIZATION WITH CORONARY ANGIOGRAM;  Surgeon: Larey Dresser, MD;  Location: [facility] CATH LAB;  Service: Cardiovascular;  Laterality: N/A;  . NECK SURGERY  03/23/09   per Dr. Lorin Mercy, cervical fusion   . right elbow surgery    . solonscopy  05/23/08   per Dr. Wynona Luna hemorrhoids only, repeat in 5 years  . TEE WITHOUT CARDIOVERSION N/A 06/23/2014   Procedure: TRANSESOPHAGEAL ECHOCARDIOGRAM (TEE);  Surgeon: Larey Dresser, MD;  Location: [place];  Service:  Cardiovascular;  Laterality: N/A;  . TEE WITHOUT CARDIOVERSION N/A 01/21/2016   Procedure: TRANSESOPHAGEAL ECHOCARDIOGRAM (TEE);  Surgeon: Larey Dresser, MD;  Location: [place];  Service: Cardiovascular;  Laterality: N/A;  . TONSILLECTOMY    . TRANSURETHRAL RESECTION OF BLADDER TUMOR N/A 10/21/2019   Procedure: TRANSURETHRAL RESECTION OF BLADDER TUMOR (TURBT);  Surgeon: Kathie Rhodes, MD;  Location: [facility];  Service: Urology;  Laterality: N/A;  . TRANSURETHRAL RESECTION OF BLADDER TUMOR N/A 12/13/2019   Procedure: TRANSURETHRAL RESECTION OF BLADDER TUMOR (TURBT);  Surgeon: Alexis Frock, MD;  Location: [facility];  Service: Urology;  Laterality: N/A;  1 HR   Family History  Problem Relation Age of Onset  . Lung cancer Mother        lung  . Esophageal cancer Cousin   .  Colon cancer Neg Hx   . Rectal cancer Neg Hx   . Stomach cancer Neg Hx    Social History   Socioeconomic History  . Marital status: Married    Spouse name: Not on file  . Number of children: 4  . Years of education: Not on file  . Highest education level: Not on file  Occupational History  . Occupation: Designer, multimedia: Programmer, systems  Tobacco Use  . Smoking status: Former Smoker    Packs/day: 2.00    Years: 40.00    Pack years: 80.00    Types: Cigarettes    Quit date: 2012    Years since quitting: 10.3  . Smokeless tobacco: Never Used  Vaping Use  . Vaping Use: Never used  Substance and Sexual Activity  . Alcohol use: No    Alcohol/week: 0.0 standard drinks  . Drug use: No  . Sexual activity: Not on file  Other Topics Concern  . Not on file  Social History Narrative  . Not on file   Social Determinants of Health   Financial Resource Strain: Low Risk   . Difficulty of Paying Living Expenses: Not hard at all  Food Insecurity: No Food Insecurity  . Worried About Charity fundraiser in the Last Year: Never true  . Ran Out of Food in  the Last Year: Never true  Transportation Needs: No Transportation Needs  . Lack of Transportation (Medical): No  . Lack of Transportation (Non-Medical): No  Physical Activity: Inactive  . Days of Exercise per Week: 0 days  . Minutes of Exercise per Session: 0 min  Stress: No Stress Concern Present  . Feeling of Stress : Not at all  Social Connections: Moderately Isolated  . Frequency of Communication with Friends and Family: Once a week  . Frequency of Social Gatherings with Friends and Family: Twice a week  . Attends Religious Services: Never  . Active Member of Clubs or Organizations: No  . Attends Archivist Meetings: Never  . Marital Status: Married    Tobacco Counseling Counseling given: Not Answered   Clinical Intake:  Pre-visit preparation completed: Yes  Pain : No/denies pain     BMI - recorded: 45.02 Nutritional Status: BMI > 30  Obese Nutritional Risks: None Diabetes: No  How often do you need to have someone help you when you read instructions, pamphlets, or other written materials from your doctor or pharmacy?: 1 - Never  Diabetic?No  Interpreter Needed?: No  Information entered by :: Charlott Rakes, LPN   Activities of Daily Living In your present state of health, do you have any difficulty performing the following activities: 08/12/2020 12/13/2019  Hearing? N N  Vision? N N  Difficulty concentrating or making decisions? N N  Walking or climbing stairs? Y N  Dressing or bathing? N N  Doing errands, shopping? N -  Preparing Food and eating ? N -  Using the Toilet? N -  In the past six months, have you accidently leaked urine? Y -  Comment had bladder cancer -  Do you have problems with loss of bowel control? N -  Managing your Medications? N -  Managing your Finances? N -  Housekeeping or managing your Housekeeping? N -  Some recent data might be hidden    Patient Care Team: Laurey Morale, MD as PCP - General Sueanne Margarita, MD  as PCP - Sleep Medicine (Cardiology) Thompson Grayer, MD as  Consulting Physician (Cardiology) [name], [facility] as Pharmacist (Pharmacist)  Indicate any recent Medical Services you may have received from other than [facility] providers in the past year (date may be approximate).     Assessment:   This is a routine wellness examination for Mustapha.  Hearing/Vision screen  Hearing Screening   125Hz  250Hz  500Hz  1000Hz  2000Hz  3000Hz  4000Hz  6000Hz  8000Hz   Right ear:           Left ear:           Comments: Pt denies any hearing   Vision Screening Comments: Pt follows up with Dr Gershon Crane for annual eye exams   Dietary issues and exercise activities discussed: Current Exercise Habits: The patient does not participate in regular exercise at present  Goals    . Patient Stated     Stay healthy and see my grandkids graduate     . Pharmacy Care Plan     CARE PLAN ENTRY  Current Barriers:  . Chronic Disease Management support, education, and care coordination needs related to CHF, CAD, HTN, HLD, and anxiety, chronic rhinitis, vertigo, pain   Pharmacist Clinical Goal(s):  [name] Work with the care management team to address educational, disease management, and care coordination needs  . Call provider office for new or worsened signs and symptoms  . Continue self health monitoring activities as directed today  . Call care management team with questions or concerns.  . Heart failure: Check weight daily and contact physician if weight gain of more than 3 pounds overnight or more than 5 pounds in a week. . Blood pressure:  [name] Maintain blood pressure within goal of your provider (130/80 or 140/90). . Recent home blood pressure reading: 138/76 mmHg (01/16/2020) . Last office blood pressure reading: 130/79 mmHg (07/16/2019) . High cholesterol:  . Cholesterol goals: Total Cholesterol goal under 200, Triglycerides goal under 150, HDL goal above 40 (men) or above 50 (women), LDL goal under 70.  [name] Recent  cholesterol levels (12/03/2018) - Total cholesterol: 154 - Triglycerides: 224 - HDL: 50 - LDL: 59 . Anxiety: Continue to see improvement in anxiety symptoms . Chronic rhinitis . Minimize symptoms related to allergies.  . Vertigo  . Minimize episodes of dizziness.  . Pain . Continue to see improvement in pain level.   Interventions: . Comprehensive medication review performed. . Evaluation of current treatment plans and patient's adherence to plan as established by provider . Assessed patient's education and care coordination needs . Provided disease specific education to patient . Heart failure . Continue:  - losartan 50mg , 1 tablet once daily  - furosemide 40mg , 1 tablet once daily  . Blood pressure:  . Discussed need to continue checking blood pressure at home.  . Discussed diet modifications. DASH diet:  following a diet emphasizing fruits and vegetables and low-fat dairy products along with whole grains, fish, poultry, and nuts. Reducing red meats and sugars.  . Exercising . Reducing the amount of salt intake to 1500mg /per day.  . Recommend using a salt substitute to replace your salt if you need flavor.     . Weight reduction- We discussed losing 5-10% of body weight . Discussed and reviewed technique for measuring blood pressure . Continue:  -  losartan 50mg , 1 tablet once daily  . High cholesterol/ aortic stenosis  . How to reduce cholesterol through diet/weight management and physical activity.    . We discussed how a diet high in plant sterols (fruits/vegetables/nuts/whole grains/legumes) may reduce your cholesterol.  Encouraged  increasing fiber to a daily intake of 10-25g/day  . Continue:  - Rosuvastatin 20mg , 1 tablet once daily at bedtime  - Nitroglycerin 0.4mg  SL tablet, 1 tablet under tongue every five minutes as needed for chest pain - Aspirin 81mg , 1 tablet daily  . Anxiety . Continue:   - citalopram 40mg , 1 tablet once daily - diazepam 5mg , 1 tablet every  twelve hours as needed for anxiety  . Chronic rhinitis . Continue: ipratropium 0.03% nasal spray (Atrovent), 2 sprays into both nostrils every 12 hours as needed . Vertigo . Continue: Meclizine 25mg , 1 tablet three times daily as needed for dizziness.  . Pain . Continue: ibuprofen 200mg , 1 tablet two times daily as needed for moderate pain   Patient Self Care Activities:  . Self administers medications as prescribed and Calls provider office for new concerns or questions . Continue current medications as directed by providers.  . Continue following up with primary care provider and/or specialists. . Continue at home blood pressure readings. . Continue working on health habits (diet/ exercise).  Please see past updates related to this goal by clicking on the "Past Updates" button in the selected goal        Depression Screen PHQ 2/9 Scores 08/12/2020 06/16/2020  PHQ - 2 Score 0 0  PHQ- 9 Score - 4    Fall Risk Fall Risk  08/12/2020  Falls in the past year? 1  Number falls in past yr: 1  Injury with Fall? 0  Risk for fall due to : Impaired vision  Follow up Falls prevention discussed    FALL RISK PREVENTION PERTAINING TO THE HOME:  Any stairs in or around the home? Yes  If so, are there any without handrails? No  Home free of loose throw rugs in walkways, pet beds, electrical cords, etc? Yes  Adequate lighting in your home to reduce risk of falls? Yes   ASSISTIVE DEVICES UTILIZED TO PREVENT FALLS:  Life alert? No  Use of a cane, walker or w/c? No  Grab bars in the bathroom? No  Shower chair or bench in shower? No  Elevated toilet seat or a handicapped toilet? No   TIMED UP AND GO:  Was the test performed? No .     Cognitive Function:     6CIT Screen 08/12/2020  What Year? 0 points  What month? 0 points  Count back from 20 0 points  Months in reverse 2 points  Repeat phrase 0 points    Immunizations Immunization History  Administered Date(s) Administered   . Influenza Whole 03/23/2007  . PFIZER(Purple Top)SARS-COV-2 Vaccination 06/30/2019, 07/30/2019, 01/08/2020    TDAP status: Due, Education has been provided regarding the importance of this vaccine. Advised may receive this vaccine at local pharmacy or Health Dept. Aware to provide a copy of the vaccination record if obtained from local pharmacy or Health Dept. Verbalized acceptance and understanding.  Flu Vaccine status: Declined, Education has been provided regarding the importance of this vaccine but patient still declined. Advised may receive this vaccine at local pharmacy or Health Dept. Aware to provide a copy of the vaccination record if obtained from local pharmacy or Health Dept. Verbalized acceptance and understanding.  Pneumococcal vaccine status: Declined,  Education has been provided regarding the importance of this vaccine but patient still declined. Advised may receive this vaccine at local pharmacy or Health Dept. Aware to provide a copy of the vaccination record if obtained from local pharmacy or Health Dept. Verbalized acceptance and understanding.  Covid-19 vaccine status: Completed vaccines  Qualifies for Shingles Vaccine? Yes   Zostavax completed No   Shingrix Completed?: No.    Education has been provided regarding the importance of this vaccine. Patient has been advised to call insurance company to determine out of pocket expense if they have not yet received this vaccine. Advised may also receive vaccine at local pharmacy or Health Dept. Verbalized acceptance and understanding.  Screening Tests Health Maintenance  Topic Date Due  . Hepatitis C Screening  Never done  . TETANUS/TDAP  Never done  . PNA vac Low Risk Adult (1 of 2 - PCV13) Never done  . INFLUENZA VACCINE  11/23/2020  . COLONOSCOPY (Pts 45-51yrs Insurance coverage will need to be confirmed)  10/03/2025  . COVID-19 Vaccine  Completed  . HPV VACCINES  Aged Out    Health Maintenance  Health Maintenance  Due  Topic Date Due  . Hepatitis C Screening  Never done  . TETANUS/TDAP  Never done  . PNA vac Low Risk Adult (1 of 2 - PCV13) Never done    Colorectal cancer screening: Type of screening: Colonoscopy. Completed 10/04/18. Repeat every 7 years   Additional Screening:  Hepatitis C Screening: does qualify;  Vision Screening: Recommended annual ophthalmology exams for early detection of glaucoma and other disorders of the eye. Is the patient up to date with their annual eye exam?  Yes  Who is the provider or what is the name of the office in which the patient attends annual eye exams? Dr Gershon Crane  If pt is not established with a provider, would they like to be referred to a provider to establish care? No .   Dental Screening: Recommended annual dental exams for proper oral hygiene  Community Resource Referral / Chronic Care Management: CRR required this visit?  No   CCM required this visit?  No      Plan:     I have personally reviewed and noted the following in the patient's chart:   . Medical and social history . Use of alcohol, tobacco or illicit drugs  . Current medications and supplements . Functional ability and status . Nutritional status . Physical activity . Advanced directives . List of other physicians . Hospitalizations, surgeries, and ER visits in previous 12 months . Vitals . Screenings to include cognitive, depression, and falls . Referrals and appointments  In addition, I have reviewed and discussed with patient certain preventive protocols, quality metrics, and best practice recommendations. A written personalized care plan for preventive services as well as general preventive health recommendations were provided to patient.     Willette Brace, LPN   [id number]   Nurse Notes: None

## 2020-08-12 NOTE — Patient Instructions (Addendum)
Luis [name] , Thank you for taking time to come for your Medicare Wellness Visit. I appreciate your ongoing commitment to your health goals. Please review the following plan we discussed and let me know if I can assist you in the future.   Screening recommendations/referrals: Colonoscopy: Done 10/04/18 Recommended yearly ophthalmology/optometry visit for glaucoma screening and checkup Recommended yearly dental visit for hygiene and checkup  Vaccinations: Influenza vaccine: Declined  Pneumococcal vaccine: Declined Tdap vaccine: Due and discussed Shingles vaccine: Shingrix discussed. Please contact your pharmacy for coverage information.    Covid-19: Completed 3/7, 4/6, & 01/08/20  Advanced directives: Please bring a copy of your health care power of attorney and living will to the office at your convenience.  Conditions/risks identified: Stay healthy and attend grandkids graduation  Next appointment: Follow up in one year for your annual wellness visit.   Preventive Care 43 Years and Older, Male Preventive care refers to lifestyle choices and visits with your health care provider that can promote health and wellness. What does preventive care include?  A yearly physical exam. This is also called an annual well check.  Dental exams once or twice a year.  Routine eye exams. Ask your health care provider how often you should have your eyes checked.  Personal lifestyle choices, including:  Daily care of your teeth and gums.  Regular physical activity.  Eating a healthy diet.  Avoiding tobacco and drug use.  Limiting alcohol use.  Practicing safe sex.  Taking low doses of aspirin every day.  Taking vitamin and mineral supplements as recommended by your health care provider. What happens during an annual well check? The services and screenings done by your health care provider during your annual well check will depend on your age, overall health, lifestyle risk factors, and  family history of disease. Counseling  Your health care provider may ask you questions about your:  Alcohol use.  Tobacco use.  Drug use.  Emotional well-being.  Home and relationship well-being.  Sexual activity.  Eating habits.  History of falls.  Memory and ability to understand (cognition).  Work and work Statistician. Screening  You may have the following tests or measurements:  Height, weight, and BMI.  Blood pressure.  Lipid and cholesterol levels. These may be checked every 5 years, or more frequently if you are over 56 years old.  Skin check.  Lung cancer screening. You may have this screening every year starting at age 48 if you have a 30-pack-year history of smoking and currently smoke or have quit within the past 15 years.  Fecal occult blood test (FOBT) of the stool. You may have this test every year starting at age 24.  Flexible sigmoidoscopy or colonoscopy. You may have a sigmoidoscopy every 5 years or a colonoscopy every 10 years starting at age 65.  Prostate cancer screening. Recommendations will vary depending on your family history and other risks.  Hepatitis C blood test.  Hepatitis B blood test.  Sexually transmitted disease (STD) testing.  Diabetes screening. This is done by checking your blood sugar (glucose) after you have not eaten for a while (fasting). You may have this done every 1-3 years.  Abdominal aortic aneurysm (AAA) screening. You may need this if you are a current or former smoker.  Osteoporosis. You may be screened starting at age 66 if you are at high risk. Talk with your health care provider about your test results, treatment options, and if necessary, the need for more tests. Vaccines  Your  health care provider may recommend certain vaccines, such as:  Influenza vaccine. This is recommended every year.  Tetanus, diphtheria, and acellular pertussis (Tdap, Td) vaccine. You may need a Td booster every 10 years.  Zoster  vaccine. You may need this after age 62.  Pneumococcal 13-valent conjugate (PCV13) vaccine. One dose is recommended after age 56.  Pneumococcal polysaccharide (PPSV23) vaccine. One dose is recommended after age 68. Talk to your health care provider about which screenings and vaccines you need and how often you need them. This information is not intended to replace advice given to you by your health care provider. Make sure you discuss any questions you have with your health care provider. Document Released: 05/08/2015 Document Revised: 12/30/2015 Document Reviewed: 02/10/2015 Elsevier Interactive Patient Education  2017 [place] Prevention in the Home Falls can cause injuries. They can happen to people of all ages. There are many things you can do to make your home safe and to help prevent falls. What can I do on the outside of my home?  Regularly fix the edges of walkways and driveways and fix any cracks.  Remove anything that might make you trip as you walk through a door, such as a raised step or threshold.  Trim any bushes or trees on the path to your home.  Use bright outdoor lighting.  Clear any walking paths of anything that might make someone trip, such as rocks or tools.  Regularly check to see if handrails are loose or broken. Make sure that both sides of any steps have handrails.  Any raised decks and porches should have guardrails on the edges.  Have any leaves, snow, or ice cleared regularly.  Use sand or salt on walking paths during winter.  Clean up any spills in your garage right away. This includes oil or grease spills. What can I do in the bathroom?  Use night lights.  Install grab bars by the toilet and in the tub and shower. Do not use towel bars as grab bars.  Use non-skid mats or decals in the tub or shower.  If you need to sit down in the shower, use a plastic, non-slip stool.  Keep the floor dry. Clean up any water that spills on the  floor as soon as it happens.  Remove soap buildup in the tub or shower regularly.  Attach bath mats securely with double-sided non-slip rug tape.  Do not have throw rugs and other things on the floor that can make you trip. What can I do in the bedroom?  Use night lights.  Make sure that you have a light by your bed that is easy to reach.  Do not use any sheets or blankets that are too big for your bed. They should not hang down onto the floor.  Have a firm chair that has side arms. You can use this for support while you get dressed.  Do not have throw rugs and other things on the floor that can make you trip. What can I do in the kitchen?  Clean up any spills right away.  Avoid walking on wet floors.  Keep items that you use a lot in easy-to-reach places.  If you need to reach something above you, use a strong step stool that has a grab bar.  Keep electrical cords out of the way.  Do not use floor polish or wax that makes floors slippery. If you must use wax, use non-skid floor wax.  Do not  have throw rugs and other things on the floor that can make you trip. What can I do with my stairs?  Do not leave any items on the stairs.  Make sure that there are handrails on both sides of the stairs and use them. Fix handrails that are broken or loose. Make sure that handrails are as long as the stairways.  Check any carpeting to make sure that it is firmly attached to the stairs. Fix any carpet that is loose or worn.  Avoid having throw rugs at the top or bottom of the stairs. If you do have throw rugs, attach them to the floor with carpet tape.  Make sure that you have a light switch at the top of the stairs and the bottom of the stairs. If you do not have them, ask someone to add them for you. What else can I do to help prevent falls?  Wear shoes that:  Do not have high heels.  Have rubber bottoms.  Are comfortable and fit you well.  Are closed at the toe. Do not wear  sandals.  If you use a stepladder:  Make sure that it is fully opened. Do not climb a closed stepladder.  Make sure that both sides of the stepladder are locked into place.  Ask someone to hold it for you, if possible.  Clearly mark and make sure that you can see:  Any grab bars or handrails.  First and last steps.  Where the edge of each step is.  Use tools that help you move around (mobility aids) if they are needed. These include:  Canes.  Walkers.  Scooters.  Crutches.  Turn on the lights when you go into a dark area. Replace any light bulbs as soon as they burn out.  Set up your furniture so you have a clear path. Avoid moving your furniture around.  If any of your floors are uneven, fix them.  If there are any pets around you, be aware of where they are.  Review your medicines with your doctor. Some medicines can make you feel dizzy. This can increase your chance of falling. Ask your doctor what other things that you can do to help prevent falls. This information is not intended to replace advice given to you by your health care provider. Make sure you discuss any questions you have with your health care provider. Document Released: 02/05/2009 Document Revised: 09/17/2015 Document Reviewed: 05/16/2014 Elsevier Interactive Patient Education  2017 [employer].

## 2020-08-22 ENCOUNTER — Other Ambulatory Visit (HOSPITAL_COMMUNITY): Payer: Self-pay | Admitting: Cardiology

## 2020-08-26 ENCOUNTER — Telehealth (HOSPITAL_COMMUNITY): Payer: Self-pay | Admitting: Vascular Surgery

## 2020-08-26 MED ORDER — FUROSEMIDE 40 MG PO TABS: 40.0000 mg | ORAL_TABLET | Freq: Every day | ORAL | 0 refills | Status: DC

## 2020-08-26 NOTE — Telephone Encounter (Signed)
Pt needs [facility] / PT is scheduled to see [facility] 7/13

## 2020-09-11 ENCOUNTER — Telehealth: Payer: Self-pay | Admitting: Pharmacist

## 2020-09-11 NOTE — Chronic Care Management (AMB) (Signed)
Chronic Care Management Pharmacy Assistant   Name: Luis [name]  MRN: [id number] DOB: 30-Apr-1955  Reason for Encounter: Disease State/ Hypertension Assessment Call.   Conditions to be addressed/monitored: HTN   Recent office visits:  None.  Recent consult visits:  None.   Hospital visits:  None in previous 6 months  Medications: Outpatient Encounter Medications as of 09/11/2020  Medication Sig  . aspirin 81 MG tablet Take 81 mg by mouth every morning.  . diazepam (VALIUM) 5 MG tablet TAKE 1 TABLET (5 MG TOTAL) BY MOUTH EVERY 12 (TWELVE) HOURS AS NEEDED FOR ANXIETY.  . furosemide (LASIX) 40 MG tablet Take 1 tablet (40 mg total) by mouth daily.  [name] ibuprofen (ADVIL,MOTRIN) 200 MG tablet Take 200 mg by mouth 2 (two) times daily as needed for moderate pain.   [name] ipratropium (ATROVENT) 0.03 % nasal spray Place 2 sprays into both nostrils every 12 (twelve) hours as needed for rhinitis.  [name] losartan (COZAAR) 50 MG tablet Take 1 tablet (50 mg total) by mouth in the morning and at bedtime.  [name] MAGNESIUM PO Take 500 mg by mouth daily.  . meclizine (ANTIVERT) 25 MG tablet Take 1 tablet (25 mg total) by mouth 3 (three) times daily as needed for dizziness.  . nitroGLYCERIN (NITROSTAT) 0.4 MG SL tablet Place 1 tablet (0.4 mg total) under the tongue every 5 (five) minutes as needed. For chest pain.  . phenazopyridine (PYRIDIUM) 200 MG tablet Take 1 tablet (200 mg total) by mouth 3 (three) times daily as needed for pain.  . rosuvastatin (CRESTOR) 20 MG tablet TAKE 1 TABLET BY MOUTH EVERYDAY AT BEDTIME  . tamsulosin (FLOMAX) 0.4 MG CAPS capsule Take 0.4 mg by mouth daily.  [name] venlafaxine XR (EFFEXOR-XR) 150 MG 24 hr capsule TAKE 1 CAPSULE BY MOUTH DAILY WITH BREAKFAST.   No facility-administered encounter medications on file as of 09/11/2020.    Reviewed chart prior to disease state call. Spoke with patient regarding BP  Recent Office Vitals: BP Readings from Last 3 Encounters:  07/07/20  118/74  06/16/20 118/72  04/28/20 130/78   Pulse Readings from Last 3 Encounters:  07/07/20 74  06/16/20 61  04/28/20 (!) 57    Wt Readings from Last 3 Encounters:  07/07/20 278 lb 12.8 oz (126.5 kg)  06/16/20 277 lb (125.6 kg)  04/28/20 271 lb (122.9 kg)     Kidney Function Lab Results  Component Value Date/Time   CREATININE 1.02 05/14/2020 02:46 PM   CREATININE 0.99 04/28/2020 12:26 PM   CREATININE 1.08 01/14/2016 02:31 PM   CREATININE 1.05 11/27/2015 03:07 PM   GFR 85.71 11/26/2014 09:11 AM   GFRNONAA >60 05/14/2020 02:46 PM   GFRAA >60 08/10/2019 12:24 PM    BMP Latest Ref Rng & Units 05/14/2020 04/28/2020 12/13/2019  Glucose 70 - 99 mg/dL 118(H) 94 110(H)  BUN 8 - 23 mg/dL 14 15 25(H)  Creatinine 0.61 - 1.24 mg/dL 1.02 0.99 1.00  Sodium 135 - 145 mmol/L 139 137 139  Potassium 3.5 - 5.1 mmol/L 4.0 3.8 4.6  Chloride 98 - 111 mmol/L 103 102 101  CO2 22 - 32 mmol/L 27 24 -  Calcium 8.9 - 10.3 mg/dL 9.0 9.0 -    . Current antihypertensive regimen:  o Losartan 50mg  - take twice a day.  . How often are you checking your Blood Pressure?  . Current home BP readings:  . What recent interventions/DTPs have been made by any provider to improve Blood Pressure control  since last CPP Visit: None.  . Any recent hospitalizations or ED visits since last visit with CPP? No . What diet changes have been made to improve Blood Pressure Control? o  . What exercise is being done to improve your Blood Pressure Control? o   Adherence Review: Is the patient currently on ACE/ARB medication? Yes Does the patient have >5 day gap between last estimated fill dates? No  Multiple unsuccessful attempts to reach patient by phone.   Star Rating Drugs:   Losartan 50mg  - [facility]  Rosuvastatin 20mg  - last filled on 06/20/20 90DS at [facility] [phone]

## 2020-09-14 ENCOUNTER — Other Ambulatory Visit (HOSPITAL_COMMUNITY): Payer: Self-pay | Admitting: Cardiology

## 2020-09-14 NOTE — Telephone Encounter (Cosign Needed)
2 attempts.

## 2020-09-16 NOTE — Telephone Encounter (Cosign Needed)
3rd attempt

## 2020-09-28 DIAGNOSIS — Z125 Encounter for screening for malignant neoplasm of prostate: Secondary | ICD-10-CM | POA: Diagnosis not present

## 2020-09-28 DIAGNOSIS — C678 Malignant neoplasm of overlapping sites of bladder: Secondary | ICD-10-CM | POA: Diagnosis not present

## 2020-10-01 ENCOUNTER — Other Ambulatory Visit: Payer: Self-pay | Admitting: Urology

## 2020-10-08 ENCOUNTER — Encounter (HOSPITAL_BASED_OUTPATIENT_CLINIC_OR_DEPARTMENT_OTHER): Payer: Self-pay | Admitting: Urology

## 2020-10-08 ENCOUNTER — Other Ambulatory Visit: Payer: Self-pay

## 2020-10-08 NOTE — Progress Notes (Signed)
Spoke w/ via phone for pre-op interview---pt Lab needs dos---- I stat 8              Lab results------see below COVID test -----patient states asymptomatic no test needed Arrive at -------800 am  10-14-2020 NPO after MN NO Solid Food.  Clear liquids from MN until---700 am then npo Med rec completed Medications to take morning of surgery -----diazepam prn, venlafaxine, tamsulosin Diabetic medication -----n/a Patient instructed no nail polish to be worn day of surgery Patient instructed to bring photo id and insurance card day of surgery Patient aware to have Driver (ride ) / caregiver wife gayle will stay    for 24 hours after surgery  Patient Special Instructions -----bring cpap maks tubing and machine and leave in car Pre-Op special Istructions -----none Patient verbalized understanding of instructions that were given at this phone interview. Patient denies shortness of breath, chest pain, fever, cough at this phone interview.   Anesthesia Review: chart reviewed by Audria Nine and [place] spoke with dr germeroth and dr Noreene Larsson on 10-18-2019 see prgress notes jessica zanetto pa and denise sexton rn 62-08-2019 barring acute status changes pt ok for lwc. Had 10-14-2019 surgery and 12-10-2019 surgery at [place] without problems. Hx of chf, cad and moderate aortic stenosis, mild lvh, had right pci 11-2010, 4.6 cm ascending aortic aneurysm, mi 2012, osa uses cpap, has future sleep study scheduled, needs new cpap, pt states no cardiac s & s , no sob at pre op  PCP: dr Nydia Bouton Cardiologist : Chest x-ray : dr Barrie Lyme 04-28-2020 epic EKG :04-28-2020 epic Echo :04-28-2020 epic Stress test: 01-18-2016 epic Cardiac Cath : 01-21-2016 epic Activity level: able to walk up stairs and do household activities without problems. Sleep Study/ CPAP :uses cpap does not know cpap settings Blood Thinner/ Instructions Maryjane Hurter Dose: n/a ASA / Instructions/ Last Dose :  pt stopping 81 mg aspirin new patient choice [facility]  pulmonary dr Mordecai Rasmussen 07-07-2020 epic

## 2020-10-13 ENCOUNTER — Telehealth: Payer: Self-pay | Admitting: Pharmacist

## 2020-10-13 NOTE — Chronic Care Management (AMB) (Signed)
    Chronic Care Management Pharmacy Assistant   Name: Luis [name]  MRN: [id number] DOB: Sep 27, 1955   Reason for Encounter: Disease State   Conditions to be addressed/monitored: HTN  Recent office visits:    Recent consult visits:    Hospital visits:  Medication Reconciliation was completed by comparing discharge summary, patient's EMR and Pharmacy list, and upon discussion with patient.  Admitted to the hospital on 06.22.2022 due to Transurethral Resection of bladder Tumor. Discharge date was 06.22.2022. Discharged from [facility].    New?Medications Started at [facility] Discharge:?? -started    Medication Changes at Hospital Discharge: -Changed None  Medications Discontinued at Hospital Discharge: -Stopped None  Medications that remain the same after Hospital Discharge:??  -All other medications will remain the same.    Medications: Outpatient Encounter Medications as of 10/13/2020  Medication Sig   aspirin 81 MG tablet Take 81 mg by mouth every morning.   diazepam (VALIUM) 5 MG tablet TAKE 1 TABLET (5 MG TOTAL) BY MOUTH EVERY 12 (TWELVE) HOURS AS NEEDED FOR ANXIETY.   furosemide (LASIX) 40 MG tablet Take 1 tablet (40 mg total) by mouth daily.   ibuprofen (ADVIL,MOTRIN) 200 MG tablet Take 200 mg by mouth 2 (two) times daily as needed for moderate pain.    ipratropium (ATROVENT) 0.03 % nasal spray Place 2 sprays into both nostrils every 12 (twelve) hours as needed for rhinitis.   losartan (COZAAR) 50 MG tablet Take 1 tablet (50 mg total) by mouth in the morning and at bedtime.   MAGNESIUM PO Take 500 mg by mouth daily.   meclizine (ANTIVERT) 25 MG tablet Take 1 tablet (25 mg total) by mouth 3 (three) times daily as needed for dizziness.   nitroGLYCERIN (NITROSTAT) 0.4 MG SL tablet Place 1 tablet (0.4 mg total) under the tongue every 5 (five) minutes as needed. For chest pain.   rosuvastatin (CRESTOR) 20 MG tablet TAKE 1 TABLET BY MOUTH EVERYDAY AT  BEDTIME   tamsulosin (FLOMAX) 0.4 MG CAPS capsule Take 0.4 mg by mouth daily.   venlafaxine XR (EFFEXOR-XR) 150 MG 24 hr capsule TAKE 1 CAPSULE BY MOUTH DAILY WITH BREAKFAST.   No facility-administered encounter medications on file as of 10/13/2020.   Attempted on several occasions to contact patient for Hypertension adherence call. Patient has not returned calls.  Date- Patient called to remind of appointment with Watt Climes on 07.01.2022 at 12:00 pm  No answer, left message of appointment date, time and type of appointment (telephone). Left message to have all medications, supplements, blood pressure and/or blood sugar logs available during appointment and to return call if need to reschedule.  Star Rating Drug: Medication Dispensed Quantity Pharmacy  Losartan 50 mg [facility]15.2022 [facility]  Rosuvastatin 20 mg [facility]23.2022 [facility]    [name], [place] Pharmacist Assistant [phone]

## 2020-10-14 ENCOUNTER — Ambulatory Visit (HOSPITAL_BASED_OUTPATIENT_CLINIC_OR_DEPARTMENT_OTHER)
Admission: RE | Admit: 2020-10-14 | Discharge: 2020-10-14 | Disposition: A | Discharge status: Home / Self Care | Payer: PPO | Attending: Urology | Admitting: Urology

## 2020-10-14 ENCOUNTER — Ambulatory Visit (HOSPITAL_BASED_OUTPATIENT_CLINIC_OR_DEPARTMENT_OTHER): Payer: PPO | Admitting: Anesthesiology

## 2020-10-14 ENCOUNTER — Encounter (HOSPITAL_BASED_OUTPATIENT_CLINIC_OR_DEPARTMENT_OTHER)
Admission: RE | Disposition: A | Discharge status: Home / Self Care | Payer: Self-pay | Source: Home / Self Care | Attending: Urology

## 2020-10-14 ENCOUNTER — Encounter (HOSPITAL_BASED_OUTPATIENT_CLINIC_OR_DEPARTMENT_OTHER): Payer: Self-pay | Admitting: Urology

## 2020-10-14 ENCOUNTER — Other Ambulatory Visit: Payer: Self-pay

## 2020-10-14 DIAGNOSIS — I2510 Atherosclerotic heart disease of native coronary artery without angina pectoris: Secondary | ICD-10-CM | POA: Insufficient documentation

## 2020-10-14 DIAGNOSIS — I252 Old myocardial infarction: Secondary | ICD-10-CM | POA: Insufficient documentation

## 2020-10-14 DIAGNOSIS — C679 Malignant neoplasm of bladder, unspecified: Secondary | ICD-10-CM | POA: Diagnosis not present

## 2020-10-14 DIAGNOSIS — Z955 Presence of coronary angioplasty implant and graft: Secondary | ICD-10-CM | POA: Insufficient documentation

## 2020-10-14 DIAGNOSIS — Z8551 Personal history of malignant neoplasm of bladder: Secondary | ICD-10-CM | POA: Insufficient documentation

## 2020-10-14 DIAGNOSIS — Z6841 Body Mass Index (BMI) 40.0 and over, adult: Secondary | ICD-10-CM | POA: Diagnosis not present

## 2020-10-14 DIAGNOSIS — C675 Malignant neoplasm of bladder neck: Secondary | ICD-10-CM | POA: Diagnosis not present

## 2020-10-14 DIAGNOSIS — Z87891 Personal history of nicotine dependence: Secondary | ICD-10-CM | POA: Insufficient documentation

## 2020-10-14 DIAGNOSIS — I5032 Chronic diastolic (congestive) heart failure: Secondary | ICD-10-CM | POA: Insufficient documentation

## 2020-10-14 DIAGNOSIS — I110 Hypertensive heart disease with heart failure: Secondary | ICD-10-CM | POA: Insufficient documentation

## 2020-10-14 DIAGNOSIS — Z885 Allergy status to narcotic agent status: Secondary | ICD-10-CM | POA: Diagnosis not present

## 2020-10-14 DIAGNOSIS — E669 Obesity, unspecified: Secondary | ICD-10-CM | POA: Insufficient documentation

## 2020-10-14 HISTORY — PX: CYSTOSCOPY W/ RETROGRADES: SHX1426

## 2020-10-14 HISTORY — PX: TRANSURETHRAL RESECTION OF BLADDER TUMOR: SHX2575

## 2020-10-14 LAB — POCT I-STAT, CHEM 8
BUN: 16 mg/dL (ref 8–23)
Calcium, Ion: 1.16 mmol/L (ref 1.15–1.40)
Chloride: 103 mmol/L (ref 98–111)
Creatinine, Ser: 1 mg/dL (ref 0.61–1.24)
Glucose, Bld: 114 mg/dL — ABNORMAL HIGH (ref 70–99)
HCT: 38 % — ABNORMAL LOW (ref 39.0–52.0)
Hemoglobin: 12.9 g/dL — ABNORMAL LOW (ref 13.0–17.0)
Potassium: 4 mmol/L (ref 3.5–5.1)
Sodium: 140 mmol/L (ref 135–145)
TCO2: 26 mmol/L (ref 22–32)

## 2020-10-14 SURGERY — TURBT (TRANSURETHRAL RESECTION OF BLADDER TUMOR)
Anesthesia: General | Site: Renal

## 2020-10-14 MED ORDER — EPHEDRINE 5 MG/ML INJ
INTRAVENOUS | Status: AC
  Filled 2020-10-14: qty 10

## 2020-10-14 MED ORDER — TRAMADOL HCL 50 MG PO TABS: 50.0000 mg | ORAL_TABLET | Freq: Four times a day (QID) | ORAL | Status: DC | PRN

## 2020-10-14 MED ORDER — ONDANSETRON HCL 4 MG/2ML IJ SOLN
INTRAMUSCULAR | Status: DC | PRN
  Administered 2020-10-14: 4 mg via INTRAVENOUS

## 2020-10-14 MED ORDER — MIDAZOLAM HCL 5 MG/5ML IJ SOLN
INTRAMUSCULAR | Status: DC | PRN
  Administered 2020-10-14: 1 mg via INTRAVENOUS

## 2020-10-14 MED ORDER — DEXAMETHASONE SODIUM PHOSPHATE 10 MG/ML IJ SOLN
INTRAMUSCULAR | Status: AC
  Filled 2020-10-14: qty 1

## 2020-10-14 MED ORDER — IOHEXOL 300 MG/ML  SOLN
INTRAMUSCULAR | Status: DC | PRN
  Administered 2020-10-14: 10 mL

## 2020-10-14 MED ORDER — FENTANYL CITRATE (PF) 100 MCG/2ML IJ SOLN
INTRAMUSCULAR | Status: DC | PRN
  Administered 2020-10-14 (×2): 50 ug via INTRAVENOUS

## 2020-10-14 MED ORDER — FENTANYL CITRATE (PF) 100 MCG/2ML IJ SOLN
INTRAMUSCULAR | Status: AC
  Filled 2020-10-14: qty 2

## 2020-10-14 MED ORDER — MIDAZOLAM HCL 2 MG/2ML IJ SOLN
INTRAMUSCULAR | Status: AC
  Filled 2020-10-14: qty 2

## 2020-10-14 MED ORDER — LIDOCAINE HCL (CARDIAC) PF 100 MG/5ML IV SOSY
PREFILLED_SYRINGE | INTRAVENOUS | Status: DC | PRN
  Administered 2020-10-14: 60 mg via INTRAVENOUS

## 2020-10-14 MED ORDER — LIDOCAINE HCL (PF) 2 % IJ SOLN
INTRAMUSCULAR | Status: AC
  Filled 2020-10-14: qty 5

## 2020-10-14 MED ORDER — SODIUM CHLORIDE 0.9 % IR SOLN
Status: DC | PRN
  Administered 2020-10-14: 3000 mL

## 2020-10-14 MED ORDER — PHENYLEPHRINE HCL (PRESSORS) 10 MG/ML IV SOLN
INTRAVENOUS | Status: DC | PRN
  Administered 2020-10-14: 40 ug via INTRAVENOUS
  Administered 2020-10-14: 80 ug via INTRAVENOUS

## 2020-10-14 MED ORDER — STERILE WATER FOR IRRIGATION IR SOLN
Status: DC | PRN
  Administered 2020-10-14: 500 mL

## 2020-10-14 MED ORDER — PROPOFOL 10 MG/ML IV BOLUS
INTRAVENOUS | Status: DC | PRN
  Administered 2020-10-14: 50 mg via INTRAVENOUS
  Administered 2020-10-14: 150 mg via INTRAVENOUS

## 2020-10-14 MED ORDER — TRAMADOL HCL 50 MG PO TABS: 50.0000 mg | ORAL_TABLET | Freq: Four times a day (QID) | ORAL | 0 refills | Status: DC | PRN

## 2020-10-14 MED ORDER — LACTATED RINGERS IV SOLN: INTRAVENOUS | Status: DC

## 2020-10-14 MED ORDER — SUCCINYLCHOLINE CHLORIDE 200 MG/10ML IV SOSY
PREFILLED_SYRINGE | INTRAVENOUS | Status: AC
  Filled 2020-10-14: qty 10

## 2020-10-14 MED ORDER — GENTAMICIN SULFATE 40 MG/ML IJ SOLN
5.0000 mg/kg | INTRAVENOUS | Status: AC
  Administered 2020-10-14: 440 mg via INTRAVENOUS
  Filled 2020-10-14: qty 11

## 2020-10-14 MED ORDER — DEXAMETHASONE SODIUM PHOSPHATE 4 MG/ML IJ SOLN
INTRAMUSCULAR | Status: DC | PRN
  Administered 2020-10-14: 10 mg via INTRAVENOUS

## 2020-10-14 MED ORDER — PHENYLEPHRINE 40 MCG/ML (10ML) SYRINGE FOR IV PUSH (FOR BLOOD PRESSURE SUPPORT)
PREFILLED_SYRINGE | INTRAVENOUS | Status: AC
  Filled 2020-10-14: qty 10

## 2020-10-14 MED ORDER — ONDANSETRON HCL 4 MG/2ML IJ SOLN
INTRAMUSCULAR | Status: AC
  Filled 2020-10-14: qty 2

## 2020-10-14 SURGICAL SUPPLY — 33 items
BAG DRAIN URO-CYSTO SKYTR STRL (DRAIN) ×3 IMPLANT
BAG DRN RND TRDRP ANRFLXCHMBR (UROLOGICAL SUPPLIES)
BAG DRN UROCATH (DRAIN) ×2
BAG URINE DRAIN 2000ML AR STRL (UROLOGICAL SUPPLIES) IMPLANT
BAG URINE LEG 500ML (DRAIN) IMPLANT
CATH FOLEY 2WAY SLVR  5CC 22FR (CATHETERS)
CATH FOLEY 2WAY SLVR 30CC 20FR (CATHETERS) IMPLANT
CATH FOLEY 2WAY SLVR 5CC 22FR (CATHETERS) IMPLANT
CATH INTERMIT  6FR 70CM (CATHETERS) ×3 IMPLANT
CLOTH BEACON ORANGE TIMEOUT ST (SAFETY) ×3 IMPLANT
ELECT REM PT RETURN 9FT ADLT (ELECTROSURGICAL) ×3
ELECTRODE REM PT RTRN 9FT ADLT (ELECTROSURGICAL) ×2 IMPLANT
EVACUATOR MICROVAS BLADDER (UROLOGICAL SUPPLIES) IMPLANT
GLOVE SURG ENC MOIS LTX SZ7.5 (GLOVE) ×3 IMPLANT
GLOVE SURG UNDER POLY LF SZ6.5 (GLOVE) ×6 IMPLANT
GOWN STRL REUS W/ TWL LRG LVL3 (GOWN DISPOSABLE) ×2 IMPLANT
GOWN STRL REUS W/TWL LRG LVL3 (GOWN DISPOSABLE) ×6 IMPLANT
GUIDEWIRE ANG ZIPWIRE 038X150 (WIRE) IMPLANT
GUIDEWIRE STR DUAL SENSOR (WIRE) ×3 IMPLANT
IV NS 1000ML (IV SOLUTION)
IV NS 1000ML BAXH (IV SOLUTION) IMPLANT
IV NS IRRIG 3000ML ARTHROMATIC (IV SOLUTION) ×3 IMPLANT
KIT TURNOVER CYSTO (KITS) ×3 IMPLANT
LOOP CUT BIPOLAR 24F LRG (ELECTROSURGICAL) IMPLANT
MANIFOLD NEPTUNE II (INSTRUMENTS) ×3 IMPLANT
NS IRRIG 500ML POUR BTL (IV SOLUTION) ×3 IMPLANT
PACK CYSTO (CUSTOM PROCEDURE TRAY) ×3 IMPLANT
SYR 10ML LL (SYRINGE) IMPLANT
SYR TOOMEY IRRIG 70ML (MISCELLANEOUS)
SYRINGE TOOMEY IRRIG 70ML (MISCELLANEOUS) IMPLANT
TUBE CONNECTING 12X1/4 (SUCTIONS) ×3 IMPLANT
TUBING UROLOGY SET (TUBING) ×3 IMPLANT
WATER STERILE IRR 500ML POUR (IV SOLUTION) ×3 IMPLANT

## 2020-10-14 NOTE — H&P (Signed)
Luis [name] is an 65 y.o. male.    Chief Complaint: Pre-OP Transurethral Resection of Bladder Tumor  HPI:   1 - High Grade Bladder Cancer - T1G3 by TURBT + gemcitabine 09/2019 by Karsten Ro for small left wall tumor, no muscle in specimen on eval gross hematuria. CT w/o upper tract lesions. Restaging 11/2019 with CIS only, no gross disease. Induction BCG x 6 ending 01/2020.   Recent Surveillance:  03/2020 cysto - old resection site left bladder neck noted.  06/2020 cysto - no recurrence; 09/2020 cysto - left bladder neck 1.5cm recurrence high-grade appearing.   2 - Urinary Retention - on/off uirnary retention 2021. Prostate vol 78mL (fiarly normal for age) by CT ellipsoid calculation 2021. Placed on tamsulosin and passed trial of void 10/2019. FU PVR 11/2019 "59ml"   PMH sig for obesity, CAD/Stents/CHF/Lasix (follows Dr. Algernon Huxley, ASA only now which he has held prior), lap chole, neck surgery, elbow surgery. He is retired Engineer, structural. His PCP is Kemper Durie MD.   Today " [name]" is seen to proceed with TURBT for recurrent bladder cancer. No interval fevers. Most recent UA without infectious parameters.   Past Medical History:  Diagnosis Date   Anxiety    takes Valium as needed   Aortic stenosis, moderate    Arthritis    Ascending aortic aneurysm (HCC)    CAD (coronary artery disease)    a. s/p PCI of the RCA 8/12 with DES by Dr Burt Knack, preserved EF. b. LHC/RHC (2/16) with mean RA 12, PA 32/15, mean PCWP 18, CI 3.47; patent mid and distal RCA stents, 50-60% proximal stenosis small PDA.      Carotid stenosis    a. Carotid US (05/2013):  Bilateral 1-39% ICA; L thyroid nodule (prior hx of aspiration).   Chronic diastolic CHF (congestive heart failure) (HCC)    Depression    Essential hypertension    GERD (gastroesophageal reflux disease)    if needed will take OTC meds    Heart murmur    History of colonic polyps    hyperplastic   Hyperlipidemia    Joint pain    Lesion of  bladder    Myocardial infarction ([place]) 2012   Obesity (BMI 30-39.9) 02/29/2016   Restless leg    Sleep apnea    uses cpap   Tubular adenoma of colon    Vertigo    takes Meclizine as needed    Past Surgical History:  Procedure Laterality Date   CATARACT EXTRACTION   4 YRS AGO   BOTH EYES   CHOLECYSTECTOMY  07/21/2011   Procedure: LAPAROSCOPIC CHOLECYSTECTOMY WITH INTRAOPERATIVE CHOLANGIOGRAM;  Surgeon: Shann Medal, MD;  Location: [facility];  Service: General;  Laterality: N/A;   CORONARY ANGIOPLASTY  2012   2 stents   coronary stenting     s/p PCI of the RCA by Dr Burt Knack 8/12 with 2 promus stents   CYSTOSCOPY W/ RETROGRADES Bilateral 12/13/2019   Procedure: CYSTOSCOPY WITH RETROGRADE PYELOGRAM;  Surgeon: Alexis Frock, MD;  [facility];  Service: Urology;  Laterality: Bilateral;   LEFT AND RIGHT HEART CATHETERIZATION WITH CORONARY ANGIOGRAM N/A 06/23/2014   Procedure: LEFT AND RIGHT HEART CATHETERIZATION WITH CORONARY ANGIOGRAM;  Surgeon: Larey Dresser, MD;  Location: [facility] CATH LAB;  Service: Cardiovascular;  Laterality: N/A;   NECK SURGERY  03/23/09   per Dr. Lorin Mercy, cervical fusion    right elbow surgery     solonscopy  05/23/08   per Dr. Wynona Luna hemorrhoids  only, repeat in 5 years   TEE WITHOUT CARDIOVERSION N/A 06/23/2014   Procedure: TRANSESOPHAGEAL ECHOCARDIOGRAM (TEE);  Surgeon: Larey Dresser, MD;  Location: [facility];  Service: Cardiovascular;  Laterality: N/A;   TEE WITHOUT CARDIOVERSION N/A 01/21/2016   Procedure: TRANSESOPHAGEAL ECHOCARDIOGRAM (TEE);  Surgeon: Larey Dresser, MD;  Location: [place];  Service: Cardiovascular;  Laterality: N/A;   TONSILLECTOMY     TRANSURETHRAL RESECTION OF BLADDER TUMOR N/A 10/21/2019   Procedure: TRANSURETHRAL RESECTION OF BLADDER TUMOR (TURBT);  Surgeon: Kathie Rhodes, MD;  Location: [facility];  Service: Urology;  Laterality: N/A;   TRANSURETHRAL RESECTION OF BLADDER TUMOR N/A  12/13/2019   Procedure: TRANSURETHRAL RESECTION OF BLADDER TUMOR (TURBT);  Surgeon: Alexis Frock, MD;  [facility];  Service: Urology;  Laterality: N/A;  1 HR    Family History  Problem Relation Age of Onset   Lung cancer Mother        lung   Esophageal cancer Cousin    Colon cancer Neg Hx    Rectal cancer Neg Hx    Stomach cancer Neg Hx    Social History:  reports that he quit smoking about 10 years ago. His smoking use included cigarettes. He has a 80.00 pack-year smoking history. He has never used smokeless tobacco. He reports that he does not drink alcohol and does not use drugs.  Allergies:  Allergies  Allergen Reactions   Oxycodone     insomnia    No medications prior to admission.    No results found for this or any previous visit (from the past 48 hour(s)). No results found.  Review of Systems  Constitutional:  Negative for chills and fever.  All other systems reviewed and are negative.  Height 5\' 6"  (1.676 m), weight 123.8 kg. Physical Exam Vitals reviewed.  HENT:     Head: Normocephalic.     Nose: Nose normal.     Mouth/Throat:     Mouth: Mucous membranes are moist.  Eyes:     Pupils: Pupils are equal, round, and reactive to light.  Cardiovascular:     Rate and Rhythm: Normal rate.  Pulmonary:     Effort: Pulmonary effort is normal.  Abdominal:     General: Abdomen is flat.     Comments: Stable large truncal obesity.   Musculoskeletal:        General: Normal range of motion.     Cervical back: Normal range of motion.  Skin:    General: Skin is warm.  Neurological:     General: No focal deficit present.     Mental Status: He is alert.  Psychiatric:        Mood and Affect: Mood normal.     Assessment/Plan  Proceed as planned with TURBT, retrogrades for recurrent bladder cancer. Risks, benefits, alternatives, expected peri-op course discussed previously and reiterated today, including need for possible peri-op catheter  especially with his h/o retention.   Alexis Frock, MD 10/14/2020, 7:53 AM

## 2020-10-14 NOTE — Anesthesia Postprocedure Evaluation (Signed)
Anesthesia Post Note  Patient: Luis [name]  Procedure(s) Performed: TRANSURETHRAL RESECTION OF BLADDER TUMOR (TURBT) (Bladder) CYSTOSCOPY WITH RETROGRADE PYELOGRAM (Bilateral: Renal)     Patient location during evaluation: PACU Anesthesia Type: General Level of consciousness: awake and alert Pain management: pain level controlled Vital Signs Assessment: post-procedure vital signs reviewed and stable Respiratory status: spontaneous breathing, nonlabored ventilation, respiratory function stable and patient connected to nasal cannula oxygen Cardiovascular status: blood pressure returned to baseline and stable Postop Assessment: no apparent nausea or vomiting Anesthetic complications: no   No notable events documented.  Last Vitals:  Vitals:   10/14/20 1101 10/14/20 1145  BP: 119/65 121/79  Pulse: (!) 56 (!) 54  Resp: 12 15  Temp:  (!) 36.3 C  SpO2: 96% 97%    Last Pain:  Vitals:   10/14/20 1145  TempSrc:   PainSc: 3                  Rondell Pardon P Mayelin Panos

## 2020-10-14 NOTE — Anesthesia Preprocedure Evaluation (Addendum)
Anesthesia Evaluation  Patient identified by MRN, date of birth, ID band Patient awake    Reviewed: Allergy & Precautions, NPO status , Patient's Chart, lab work & pertinent test results  Airway Mallampati: III  TM Distance: >3 FB Neck ROM: Full    Dental  (+) Partial Upper, Edentulous Lower   Pulmonary sleep apnea and Continuous Positive Airway Pressure Ventilation , former smoker,    Pulmonary exam normal        Cardiovascular hypertension, + CAD, + Past MI (2012), + Cardiac Stents and +CHF  + Valvular Problems/Murmurs AS  Rhythm:Regular Rate:Normal     Neuro/Psych Anxiety Depression negative neurological ROS     GI/Hepatic Neg liver ROS, GERD  ,  Endo/Other  negative endocrine ROS  Renal/GU negative Renal ROS Bladder dysfunction  Recurrent bladder Ca    Musculoskeletal  (+) Arthritis , Osteoarthritis,    Abdominal (+)  Abdomen: soft. Bowel sounds: normal.  Peds  Hematology negative hematology ROS (+)   Anesthesia Other Findings   Reproductive/Obstetrics                            Anesthesia Physical Anesthesia Plan  ASA: 3  Anesthesia Plan: General   Post-op Pain Management:    Induction: Intravenous  PONV Risk Score and Plan: 2 and Ondansetron, Dexamethasone and Treatment may vary due to age or medical condition  Airway Management Planned: Mask and LMA  Additional Equipment: None  Intra-op Plan:   Post-operative Plan: Extubation in OR  Informed Consent: I have reviewed the patients History and Physical, chart, labs and discussed the procedure including the risks, benefits and alternatives for the proposed anesthesia with the patient or authorized representative who has indicated his/her understanding and acceptance.     Dental advisory given  Plan Discussed with: CRNA  Anesthesia Plan Comments: (Lab Results      Component                Value               Date                       WBC                      6.8                 04/28/2020                HGB                      12.9 (L)            10/14/2020                HCT                      38.0 (L)            10/14/2020                MCV                      88.9                04/28/2020                PLT  142 (L)             04/28/2020           Lab Results      Component                Value               Date                      NA                       140                 10/14/2020                K                        4.0                 10/14/2020                CO2                      27                  05/14/2020                GLUCOSE                  114 (H)             10/14/2020                BUN                      16                  10/14/2020                CREATININE               1.00                10/14/2020                CALCIUM                  9.0                 05/14/2020                GFRNONAA                 >60                 05/14/2020                GFRAA                    >60                 08/10/2019            ECHO 01/22: 1. Left ventricular ejection fraction, by estimation, is 60 to 65%. The  left ventricle has normal function. The left ventricle has no regional  wall motion abnormalities. There is mild left ventricular hypertrophy.  Left ventricular diastolic parameters  are consistent with  Grade I diastolic dysfunction (impaired relaxation).  2. Right ventricular systolic function is normal. The right ventricular  size is normal. Tricuspid regurgitation signal is inadequate for assessing  PA pressure.  3. Left atrial size was mildly dilated.  4. Right atrial size was mildly dilated.  5. The mitral valve is normal in structure. No evidence of mitral valve  regurgitation. No evidence of mitral stenosis.  6. The aortic valve is bicuspid. Aortic valve regurgitation is not  visualized. Moderate aortic valve  stenosis. Aortic valve area, by VTI  measures 1.60 cm. Aortic valve mean gradient measures 32.0 mmHg.  7. Aortic dilatation noted. There is mild dilatation of the ascending  aorta, measuring 46 mm. )        Anesthesia Quick Evaluation

## 2020-10-14 NOTE — Transfer of Care (Signed)
Immediate Anesthesia Transfer of Care Note  Patient: Luis [name]  Procedure(s) Performed: TRANSURETHRAL RESECTION OF BLADDER TUMOR (TURBT) (Bladder) CYSTOSCOPY WITH RETROGRADE PYELOGRAM (Bilateral: Renal)  Patient Location: PACU  Anesthesia Type:General  Level of Consciousness: awake, alert , oriented, drowsy and patient cooperative  Airway & Oxygen Therapy: Patient Spontanous Breathing and Patient connected to face mask oxygen  Post-op Assessment: Report given to RN and Post -op Vital signs reviewed and stable  Post vital signs: Reviewed and stable  Last Vitals:  Vitals Value Taken Time  BP 98/62 10/14/20 1030  Temp    Pulse 62 10/14/20 1032  Resp 14 10/14/20 1030  SpO2 97 % 10/14/20 1032  Vitals shown include unvalidated device data.  Last Pain:  Vitals:   10/14/20 0812  TempSrc: Oral  PainSc: 0-No pain      Patients Stated Pain Goal: 4 (02/33/43 5686)  Complications: No notable events documented.

## 2020-10-14 NOTE — Discharge Instructions (Addendum)
1 - You may have urinary urgency (bladder spasms) and bloody urine on / off for up to 2 weeks. This is normal.  2 - Call MD or go to ER for fever >102, severe pain / nausea / vomiting not relieved by medications, or acute change in medical status  Post Anesthesia Home Care Instructions  Activity: Get plenty of rest for the remainder of the day. A responsible adult should stay with you for 24 hours following the procedure.  For the next 24 hours, DO NOT: -Drive a car -Operate machinery -Drink alcoholic beverages -Take any medication unless instructed by your physician -Make any legal decisions or sign important papers.  Meals: Start with liquid foods such as gelatin or soup. Progress to regular foods as tolerated. Avoid greasy, spicy, heavy foods. If nausea and/or vomiting occur, drink only clear liquids until the nausea and/or vomiting subsides. Call your physician if vomiting continues.  Special Instructions/Symptoms: Your throat may feel dry or sore from the anesthesia or the breathing tube placed in your throat during surgery. If this causes discomfort, gargle with warm salt water. The discomfort should disappear within 24 hours.       

## 2020-10-14 NOTE — Brief Op Note (Signed)
10/14/2020  10:13 AM  PATIENT:  Luis [name]  65 y.o. male  PRE-OPERATIVE DIAGNOSIS:  RECURRENT BLADDER CANCER  POST-OPERATIVE DIAGNOSIS:  RECURRENT BLADDER CANCER  PROCEDURE:  Procedure(s): TRANSURETHRAL RESECTION OF BLADDER TUMOR (TURBT) (N/A) CYSTOSCOPY WITH RETROGRADE PYELOGRAM (Bilateral)  SURGEON:  Surgeon(s) and Role:    * Alexis Frock, MD - Primary  PHYSICIAN ASSISTANT:   ASSISTANTS: none   ANESTHESIA:   general  EBL:  minimal   BLOOD ADMINISTERED:none  DRAINS: none   LOCAL MEDICATIONS USED:  NONE  SPECIMEN:  Source of Specimen:  bladder tumor  DISPOSITION OF SPECIMEN:  PATHOLOGY  COUNTS:  YES  TOURNIQUET:  * No tourniquets in log *  DICTATION: .Other Dictation: Dictation Number [id number]  PLAN OF CARE: Discharge to home after PACU  PATIENT DISPOSITION:  PACU - hemodynamically stable.   Delay start of Pharmacological VTE agent (>24hrs) due to surgical blood loss or risk of bleeding: no

## 2020-10-14 NOTE — Anesthesia Procedure Notes (Signed)
Procedure Name: LMA Insertion Date/Time: 10/14/2020 9:50 AM Performed by: Willa Frater, CRNA Pre-anesthesia Checklist: Patient identified, Emergency Drugs available, Suction available and Patient being monitored Patient Re-evaluated:Patient Re-evaluated prior to induction Oxygen Delivery Method: Circle system utilized Preoxygenation: Pre-oxygenation with 100% oxygen Induction Type: IV induction Ventilation: Mask ventilation without difficulty LMA: LMA inserted LMA Size: 5.0 Number of attempts: 1 Airway Equipment and Method: Bite block Placement Confirmation: positive ETCO2 Tube secured with: Tape Dental Injury: Teeth and Oropharynx as per pre-operative assessment

## 2020-10-15 LAB — SURGICAL PATHOLOGY

## 2020-10-15 NOTE — Op Note (Signed)
NAME: Luis [name], BUCKMAN MEDICAL RECORD NO: [id number] ACCOUNT NO: [id number] DATE OF BIRTH: 07-08-1955 FACILITY: [facility] LOCATION: [facility] PHYSICIAN: Alexis Frock, MD  Operative Report   DATE OF PROCEDURE: 10/14/2020  PREOPERATIVE DIAGNOSIS:  Recurrent bladder cancer.  PROCEDURE: 1.  Transurethral resection of bladder tumor, volume small. 2.  Bilateral retrograde pyelograms, interpretation.  ESTIMATED BLOOD LOSS:  Nil.  COMPLICATIONS:  None.  SPECIMENS:  Bladder tumor fragments to permanent pathology.  FINDINGS:  1.  Very high riding bladder neck angulation, incredibly difficult. 2.  Approximately 1.5 cm predominantly papillary tumor at the bladder neck, prostate junction at the 2 o'clock position. 3.  Complete resolution of all accessible visible tumor following transurethral resection. 4.  Unremarkable retrograde pyelograms.  INDICATIONS:  The patient is a pleasant but quite comorbid 65 year old man with history of prior superficial high-grade bladder cancer.  He has been compliant with surveillance.  He was found recently on office cystoscopy to have a recurrence of tumor,  relatively small.  Options were discussed for management including recommended path of transurethral resection given his history of high-grade disease and wished to proceed.  Informed consent was obtained and placed on the medical record.  PROCEDURE IN DETAIL:  The patient being verified, procedure being transurethral resection of bladder tumor with retrograde was confirmed.  Procedure timeout was performed.  Intravenous antibiotics administered. General LMA anesthesia induced.  The  patient placed into a low lithotomy position.  Sterile field was created.  Prepped and draped the patient's penis, perineum and proximal thigh using iodine.  Cystourethroscopy was performed using 21-French rigid cystoscope with offset lens.  Inspection  of the anterior, posterior urethra was unremarkable.  Inspection of urinary  bladder was quite difficult given his incredibly high riding bladder neck and his very large obesity, again angulation, very tricky.  As per his recent office cystoscopy, he did  have a relatively small volume recurrence of bladder tumor at the 2 o'clock position of the bladder neck, prostate area, approximately 1.5 cm.  No additional foci of tumor were noted.  Attention was directed at retrograde pyelograms.  The left ureteral  orifice was cannulated with a 6-French renal catheter and a left retrograde pyelogram was obtained.  Left retrograde pyelogram demonstrated a single left ureter, single system left kidney.  No filling defects or narrowing noted.  Similarly, right retrograde pyelogram was obtained.  Right retrograde pyelogram demonstrated single right ureter, single system right kidney.  No filling defects or narrowing noted.  Next, the cystoscope was exchanged for a 26-French resectoscope sheath with visual obturator and very careful resection was  performed of the small tumor down to the superficial stroma of the prostate. Base of this was fulgurated.  Tumor fragments were irrigated and set aside for permanent pathology.  Given the deep tissue being prostatic, the resection visibly contained  prostate tissue, thus no indication for further deep specimen.  The bladder was partially emptied per cystoscope.  The procedure was then terminated.  The patient tolerated the procedure well.  No immediate perioperative complications.  The patient was  taken to postanesthesia care in stable condition.  Plan for discharge home after trial of void.   SHW D: 10/14/2020 10:17:53 am T: 10/14/2020 10:47:00 am  JOB: [id number]/ [id number]

## 2020-10-16 ENCOUNTER — Encounter (HOSPITAL_BASED_OUTPATIENT_CLINIC_OR_DEPARTMENT_OTHER): Payer: Self-pay | Admitting: Urology

## 2020-10-23 ENCOUNTER — Ambulatory Visit (INDEPENDENT_AMBULATORY_CARE_PROVIDER_SITE_OTHER): Payer: PPO | Admitting: Pharmacist

## 2020-10-23 DIAGNOSIS — I10 Essential (primary) hypertension: Secondary | ICD-10-CM | POA: Diagnosis not present

## 2020-10-23 DIAGNOSIS — E785 Hyperlipidemia, unspecified: Secondary | ICD-10-CM | POA: Diagnosis not present

## 2020-10-23 DIAGNOSIS — F411 Generalized anxiety disorder: Secondary | ICD-10-CM

## 2020-10-23 NOTE — Patient Instructions (Signed)
Hi Luis [name],  It was great catching up with you again! Don't forget to keep an eye out on your blood pressure at home (maybe checking about once a week) and try to bring your machine to cardiology to make sure it's accurate. Also, don't forget to ask cardiology about when to take an additional furosemide if you need it based on your weight/fluid build up.  Please reach out to me if you have any questions or need anything before our follow up!  Best, Maddie  [name], PharmD, [facility]   Visit Information   Goals Addressed   None    Patient Care Plan: CCM Pharmacy Care Plan     Problem Identified: Problem: Hypertension, Hyperlipidemia, Heart Failure, Coronary Artery Disease, Anxiety and chronic rhinitis, vertigo, pain      Long-Range Goal: Patient-Specific Goal   Start Date: 06/11/2020  Expected End Date: 06/11/2021  Recent Progress: On track  Priority: High  Note:   Current Barriers:  Unable to independently monitor therapeutic efficacy Unable to achieve control of anxiety   Pharmacist Clinical Goal(s):  Patient will achieve adherence to monitoring guidelines and medication adherence to achieve therapeutic efficacy achieve control of blood pressure as evidenced by home blood pressure monitoring  through collaboration with PharmD and provider.   Interventions: 1:1 collaboration with Laurey Morale, MD regarding development and update of comprehensive plan of care as evidenced by provider attestation and co-signature Inter-disciplinary care team collaboration (see longitudinal plan of care) Comprehensive medication review performed; medication list updated in electronic medical record  Hypertension (BP goal <130/80) -Controlled -Current treatment: losartan 50mg , 1 tablet twice daily  -Medications previously tried: none  -Current home readings: 14/86/mmHg  -Current dietary habits: patient drinks 2 cups of  coffee with caffeine in the morning -Current exercise habits: patient reports he enjoys walking but his knees bother him and he is still trying to walk with the grandkids when he can -Denies hypotensive/hypertensive symptoms -Educated on Exercise goal of 150 minutes per week; Importance of home blood pressure monitoring; Symptoms of hypotension and importance of maintaining adequate hydration; -Counseled to monitor BP at home weekly, document, and provide log at future appointments -Recommended to continue current medication Counseled on substituting one cup of coffee for water  Hyperlipidemia/Coronary artery disease: (LDL goal < 70) -Controlled -Current treatment: rosuvastatin 20mg , 1 tablet once daily at bedtime  nitroglycerin 0.4mg  SL tablet, 1 tablet under tongue every five minutes as needed for chest pain ASA 81mg , 1 tablet daily -Medications previously tried: none  -Current dietary patterns: did not discuss -Current exercise habits: patient reports he enjoys walking but his knees bother him and he is still trying to walk when he can -Educated on Cholesterol goals;  Exercise goal of 150 minutes per week; -Recommended to continue current medication  Heart Failure (Goal: control symptoms and prevent exacerbations) -Controlled Type: Diastolic -NYHA Class: II (slight limitation of activity) -Ejection fraction: 60-65% (Date: 12/07/2018) -Current treatment: losartan 50mg , 1 tablet twice daily  furosemide 40mg , 1 tablet once daily -Medications previously tried: none -Current home BP/HR readings: could not provide HR readings -Current dietary habits: did not discuss -Current exercise routine:  patient reports he enjoys walking but his knees bother him and he is still trying to walk when he can -Educated on Importance of weighing daily; if you gain more than 3 pounds in one day or 5 pounds in one week, contact cardiologist Importance of blood pressure control -Recommended to continue  current  medication Recommended for patient to discuss with cardiologist about when to take an additional tablet if needed  Anxiety (Goal: minimize symptoms of anxiety) -Controlled -Current treatment: Venlafaxine 150 mg, 1 tablet once daily diazepam 5 mg, 1 tablet every twelve hours as needed for anxiety -Medications previously tried/failed: citalopram (no longer effective), escitalopram (cost) -GAD7: n/a -Educated on Benefits of medication for symptom control Benefits of cognitive-behavioral therapy with or without medication -Recommended to continue current medication  BPH (Goal: minimize symptoms of enlarged prostate) -Controlled -Current treatment  tamsulosin 0.4 mg 1 capsule daily -Medications previously tried: none  -Recommended to continue current medication  Chronic rhinitis (Goal: minimize symptoms of chronic rhinitis) -Controlled -Current treatment  ipratropium 0.03% nasal spray (Atrovent), 2 sprays into both nostrils every 12 hours as needed (per patient report, has not needed to use it in awhile) -Medications previously tried: none  -Recommended to continue current medication  Pain (Goal: minimize symptoms of pain) -Controlled -Current treatment  ibuprofen 200mg , 1 tablet two times daily as needed for moderate pain -Medications previously tried: none  -Recommended using Tylenol over NSAIDs due to risk for increasing BP and fluid retention  Vertigo (Goal: minimize symptoms of vertigo) -Controlled -Current treatment  Meclizine 25mg , 1 tablet three times daily as needed for dizziness -Medications previously tried: none  -Recommended to continue current medication   Health Maintenance -Vaccine gaps: shingles, tetanus, pneumonia -Current therapy:  none -Educated on Cost vs benefit of each product must be carefully weighed by individual consumer -Patient is satisfied with current therapy and denies issues  -Recommended to continue current medication  Patient  Goals/Self-Care Activities Patient will:  - take medications as prescribed check blood pressure weekly, document, and provide at future appointments  Follow Up Plan: Telephone follow up appointment with care management team member scheduled for: 5 months       Patient verbalizes understanding of instructions provided today and agrees to view in [place].  Telephone follow up appointment with pharmacy team member scheduled for:4 months  [name], [facility]

## 2020-10-23 NOTE — Progress Notes (Signed)
Chronic Care Management Pharmacy Note  10/23/2020 Name:  Luis [name] MRN:  [id number] DOB:  1956-02-17  Summary: BP is at goal < 130/80 per office readings   Recommendations/Changes made from today's visit: -Recommended for patient to bring BP cuff to cardiology to compare readings -Recommended for patient to discuss with cardiology about option to take an additional furosemide if needed depending on weight gain   Plan: Follow up in 4 months  Subjective: JAXIN FULFER is an 65 y.o. year old male who is a primary patient of Laurey Morale, MD.  The CCM team was consulted for assistance with disease management and care coordination needs.    Engaged with patient by telephone for follow up visit in response to provider referral for pharmacy case management and/or care coordination services.   Consent to Services:  The patient was given information about Chronic Care Management services, agreed to services, and gave verbal consent prior to initiation of services.  Please see initial visit note for detailed documentation.   Patient Care Team: Laurey Morale, MD as PCP - General Sueanne Margarita, MD as PCP - Sleep Medicine (Cardiology) Thompson Grayer, MD as Consulting Physician (Cardiology) [name], [facility] as Pharmacist (Pharmacist)  Recent office visits: 08/12/20 Charlott Rakes, LPN: Patient presented for AWV.  08/07/20 [name], MD: Patient presented for anxiety. D/c'd Celexa and started on Effexor XR 150 mg daily.  Recent consult visits: 07/07/20 Sherrilyn Rist, MD (pulmonary): Patient presented for OSA follow up. Arranged for home sleep study.   06/30/20 Alexis Frock (urology): Unable to access notes.   04/28/20 [name], MD (cardiology): Patient presented for CHF follow up. Increased losartan to 50 mg BID.  04/07/20 [name] (emergeortho): Patient presented for osteoarthritis follow up.   02/25/20 [name] (OT): Patient presented for pain in  right elbow. Unable to access notes.  02/07/20 [name] (urology): Patient presented for follow up. Unable to access notes.   Hospital visits: 10/14/20 - Patient was admitted for transurethral resection of bladder tumor.  Objective:  Lab Results  Component Value Date   CREATININE 1.00 10/14/2020   BUN 16 10/14/2020   GFR 85.71 11/26/2014   GFRNONAA >60 05/14/2020   GFRAA >60 08/10/2019   NA 140 10/14/2020   K 4.0 10/14/2020   CALCIUM 9.0 05/14/2020   CO2 27 05/14/2020    Lab Results  Component Value Date/Time   GFR 85.71 11/26/2014 09:11 AM   GFR 73.22 07/21/2014 04:14 PM    Last diabetic Eye exam: No results found for: HMDIABEYEEXA  Last diabetic Foot exam: No results found for: HMDIABFOOTEX   Lab Results  Component Value Date   CHOL 161 04/28/2020   HDL 52 04/28/2020   LDLCALC 70 04/28/2020   LDLDIRECT 145.6 03/16/2007   TRIG 194 (H) 04/28/2020   CHOLHDL 3.1 04/28/2020    Hepatic Function Latest Ref Rng & Units 08/10/2019 11/26/2014 06/20/2014  Total Protein 6.5 - 8.1 g/dL 7.1 7.1 7.2  Albumin 3.5 - 5.0 g/dL 3.8 4.3 4.2  AST 15 - 41 U/L _0 ALT 0 - 44 U/L _1 Alk Phosphatase 38 - 126 U/L 70 82 81  Total Bilirubin 0.3 - 1.2 mg/dL 1.1 1.0 0.9  Bilirubin, Direct 0.0 - 0.3 mg/dL - 0.2 0.2    Lab Results  Component Value Date/Time   TSH 0.58 11/27/2015 03:07 PM   TSH 0.91 11/26/2014 09:11 AM   FREET4 0.79 01/10/2012 08:32 AM  CBC Latest Ref Rng & Units 10/14/2020 04/28/2020 12/13/2019  WBC 4.0 - 10.5 K/uL - 6.8 -  Hemoglobin 13.0 - 17.0 g/dL 12.9(L) 13.7 13.6  Hematocrit 39.0 - 52.0 % 38.0(L) 42.4 40.0  Platelets 150 - 400 K/uL - 142(L) -    No results found for: VD25OH  Clinical ASCVD: Yes  The 10-year ASCVD risk score Mikey Bussing DC Jr., et al., 2013) is: 12.3%   Values used to calculate the score:     Age: 65 years     Sex: Male     Is Non-Hispanic African American: No     Diabetic: No     Tobacco smoker: No     Systolic Blood  Pressure: 121 mmHg     Is BP treated: Yes     HDL Cholesterol: 52 mg/dL     Total Cholesterol: 161 mg/dL    Depression screen [facility] 2/9 08/12/2020 06/16/2020  Decreased Interest 0 0  Down, Depressed, Hopeless 0 0  PHQ - 2 Score 0 0  Altered sleeping - 1  Tired, decreased energy - 3  Change in appetite - 0  Feeling bad or failure about yourself  - 0  Trouble concentrating - 0  Moving slowly or fidgety/restless - 0  Suicidal thoughts - 0  PHQ-9 Score - 4  Difficult doing work/chores - Somewhat difficult  Some recent data might be hidden     Social History   Tobacco Use  Smoking Status Former   Packs/day: 2.00   Years: 40.00   Pack years: 80.00   Types: Cigarettes   Quit date: 2012   Years since quitting: 10.5  Smokeless Tobacco Never   BP Readings from Last 3 Encounters:  10/14/20 121/79  07/07/20 118/74  06/16/20 118/72   Pulse Readings from Last 3 Encounters:  10/14/20 (!) 54  07/07/20 74  06/16/20 61   Wt Readings from Last 3 Encounters:  10/14/20 277 lb 3.2 oz (125.7 kg)  07/07/20 278 lb 12.8 oz (126.5 kg)  06/16/20 277 lb (125.6 kg)    Assessment/Interventions: Review of patient past medical history, allergies, medications, health status, including review of consultants reports, laboratory and other test data, was performed as part of comprehensive evaluation and provision of chronic care management services.   SDOH:  (Social Determinants of Health) assessments and interventions performed: No   CCM Care Plan  Allergies  Allergen Reactions   Oxycodone     insomnia    Medications Reviewed Today     Reviewed by Loa Socks, RN (Registered Nurse) on 10/14/20 at [place] List Status: <None>   Medication Order Taking? Sig Documenting Provider Last Dose Status Informant  aspirin 81 MG tablet [id number] Yes Take 81 mg by mouth every morning. [provider] Past Week Active Self  diazepam (VALIUM) 5 MG tablet [id number] Yes TAKE 1 TABLET (5 MG  TOTAL) BY MOUTH EVERY 12 (TWELVE) HOURS AS NEEDED FOR ANXIETY. Laurey Morale, MD 10/14/2020 0815 Active   furosemide (LASIX) 40 MG tablet [id number] Yes Take 1 tablet (40 mg total) by mouth daily. Larey Dresser, MD 10/13/2020 Active   ibuprofen (ADVIL,MOTRIN) 200 MG tablet [id number] Yes Take 200 mg by mouth 2 (two) times daily as needed for moderate pain.  [provider] Past Month Active Self  ipratropium (ATROVENT) 0.03 % nasal spray [id number] Yes Place 2 sprays into both nostrils every 12 (twelve) hours as needed for rhinitis. [provider] Past Month Active Self  losartan (COZAAR) 50 MG  tablet [id number] Yes Take 1 tablet (50 mg total) by mouth in the morning and at bedtime. Larey Dresser, MD 10/14/2020 0700 Active   MAGNESIUM PO [id number] Yes Take 500 mg by mouth daily. [provider] 10/14/2020 0700 Active Self  meclizine (ANTIVERT) 25 MG tablet [id number] Yes Take 1 tablet (25 mg total) by mouth 3 (three) times daily as needed for dizziness. Laurey Morale, MD Past Month Active Self  nitroGLYCERIN (NITROSTAT) 0.4 MG SL tablet [id number] No Place 1 tablet (0.4 mg total) under the tongue every 5 (five) minutes as needed. For chest pain. Larey Dresser, MD Unknown Active Self  rosuvastatin (CRESTOR) 20 MG tablet [id number] Yes TAKE 1 TABLET BY MOUTH EVERYDAY AT BEDTIME Larey Dresser, MD 10/13/2020 Active Self  tamsulosin (FLOMAX) 0.4 MG CAPS capsule [id number] Yes Take 0.4 mg by mouth daily. [provider] 10/13/2020 Active   venlafaxine XR (EFFEXOR-XR) 150 MG 24 hr capsule [id number] Yes TAKE 1 CAPSULE BY MOUTH DAILY WITH BREAKFAST. Laurey Morale, MD 10/13/2020 Active Self            Patient Active Problem List   Diagnosis Date Noted   Morbid obesity ([place]) 02/29/2016   Essential hypertension    Ascending aortic aneurysm (HCC)    Rhinitis, chronic 01/14/2015   Generalized anxiety disorder 01/14/2015   Aortic stenosis, moderate 01/02/2015   Chronic  diastolic CHF (congestive heart failure) ([place]) 06/30/2014   OSA (obstructive sleep apnea) 02/13/2012   Fatigue 01/09/2012   Gall bladder disease 06/16/2011   Hyperlipidemia 02/28/2011   Angina of effort ([place]) 12/08/2010   CAD (coronary artery disease) 12/08/2010   Tobacco abuse 12/08/2010   Aortic stenosis 12/08/2010   ANAL FISSURE 04/23/2009   INTERNAL HEMORRHOIDS 05/22/2008   COLONIC POLYPS 02/07/2008   CAROTID ARTERY STENOSIS 02/07/2008   Anxiety state 01/09/2007    Immunization History  Administered Date(s) Administered   Influenza Whole 03/23/2007   PFIZER(Purple Top)SARS-COV-2 Vaccination 06/30/2019, 07/30/2019, 01/08/2020   Patient reports the biggest change/concern from the last visit is that his bladder cancer is back. He is going next week to discuss his options as far as chemo.  Conditions to be addressed/monitored:  Hypertension, Hyperlipidemia, Heart Failure, Coronary Artery Disease, Anxiety and chronic rhinitis, vertigo, pain  Care Plan : CCM Pharmacy Care Plan  Updates made by [name], RPH since 10/23/2020 12:00 AM     Problem: Problem: Hypertension, Hyperlipidemia, Heart Failure, Coronary Artery Disease, Anxiety and chronic rhinitis, vertigo, pain      Long-Range Goal: Patient-Specific Goal   Start Date: 06/11/2020  Expected End Date: 06/11/2021  Recent Progress: On track  Priority: High  Note:   Current Barriers:  Unable to independently monitor therapeutic efficacy Unable to achieve control of anxiety   Pharmacist Clinical Goal(s):  Patient will achieve adherence to monitoring guidelines and medication adherence to achieve therapeutic efficacy achieve control of blood pressure as evidenced by home blood pressure monitoring  through collaboration with PharmD and provider.   Interventions: 1:1 collaboration with Laurey Morale, MD regarding development and update of comprehensive plan of care as evidenced by provider attestation and  co-signature Inter-disciplinary care team collaboration (see longitudinal plan of care) Comprehensive medication review performed; medication list updated in electronic medical record  Hypertension (BP goal <130/80) -Controlled -Current treatment: losartan 79m, 1 tablet twice daily  -Medications previously tried: none  -Current home readings: 14/86/mmHg  -Current dietary habits: patient drinks 2 cups of coffee with caffeine in the morning -Current exercise  habits: patient reports he enjoys walking but his knees bother him and he is still trying to walk with the grandkids when he can -Denies hypotensive/hypertensive symptoms -Educated on Exercise goal of 150 minutes per week; Importance of home blood pressure monitoring; Symptoms of hypotension and importance of maintaining adequate hydration; -Counseled to monitor BP at home weekly, document, and provide log at future appointments -Recommended to continue current medication Counseled on substituting one cup of coffee for water  Hyperlipidemia/Coronary artery disease: (LDL goal < 70) -Controlled -Current treatment: rosuvastatin 53m, 1 tablet once daily at bedtime  nitroglycerin 0.472mSL tablet, 1 tablet under tongue every five minutes as needed for chest pain ASA 8151m1 tablet daily -Medications previously tried: none  -Current dietary patterns: did not discuss -Current exercise habits: patient reports he enjoys walking but his knees bother him and he is still trying to walk when he can -Educated on Cholesterol goals;  Exercise goal of 150 minutes per week; -Recommended to continue current medication  Heart Failure (Goal: control symptoms and prevent exacerbations) -Controlled Type: Diastolic -NYHA Class: II (slight limitation of activity) -Ejection fraction: 60-65% (Date: 12/07/2018) -Current treatment: losartan 61m51m tablet twice daily  furosemide 40mg57mtablet once daily -Medications previously tried: none -Current  home BP/HR readings: could not provide HR readings -Current dietary habits: did not discuss -Current exercise routine:  patient reports he enjoys walking but his knees bother him and he is still trying to walk when he can -Educated on Importance of weighing daily; if you gain more than 3 pounds in one day or 5 pounds in one week, contact cardiologist Importance of blood pressure control -Recommended to continue current medication Recommended for patient to discuss with cardiologist about when to take an additional tablet if needed  Anxiety (Goal: minimize symptoms of anxiety) -Controlled -Current treatment: Venlafaxine 150 mg, 1 tablet once daily diazepam 5 mg, 1 tablet every twelve hours as needed for anxiety -Medications previously tried/failed: citalopram (no longer effective), escitalopram (cost) -GAD7: n/a -Educated on Benefits of medication for symptom control Benefits of cognitive-behavioral therapy with or without medication -Recommended to continue current medication  BPH (Goal: minimize symptoms of enlarged prostate) -Controlled -Current treatment  tamsulosin 0.4 mg 1 capsule daily -Medications previously tried: none  -Recommended to continue current medication  Chronic rhinitis (Goal: minimize symptoms of chronic rhinitis) -Controlled -Current treatment  ipratropium 0.03% nasal spray (Atrovent), 2 sprays into both nostrils every 12 hours as needed (per patient report, has not needed to use it in awhile) -Medications previously tried: none  -Recommended to continue current medication  Pain (Goal: minimize symptoms of pain) -Controlled -Current treatment  ibuprofen 200mg,56mablet two times daily as needed for moderate pain -Medications previously tried: none  -Recommended using Tylenol over NSAIDs due to risk for increasing BP and fluid retention  Vertigo (Goal: minimize symptoms of vertigo) -Controlled -Current treatment  Meclizine 25mg, 61mblet three times  daily as needed for dizziness -Medications previously tried: none  -Recommended to continue current medication   Health Maintenance -Vaccine gaps: shingles, tetanus, pneumonia -Current therapy:  none -Educated on Cost vs benefit of each product must be carefully weighed by individual consumer -Patient is satisfied with current therapy and denies issues  -Recommended to continue current medication  Patient Goals/Self-Care Activities Patient will:  - take medications as prescribed check blood pressure weekly, document, and provide at future appointments  Follow Up Plan: Telephone follow up appointment with care management team member scheduled for: 5 months  Medication Assistance: None required.  Patient affirms current coverage meets needs.  Compliance/Adherence/Medication fill history: Care Gaps: Hep C screening, tetanus, shingrix, pneumonia, COVID booster   Star-Rating Drugs: Losartan 65m - [facility]07[facility] Rosuvastatin 266m- last [facility]14/20 [facility]  Patient's preferred pharmacy is:  [id number] [place]: [phone]: [address] [id number]: [phone]: [phone]/pharmacy #[id number] -[place] [address] [id number]: [phone]: [phone] pill box? No - uses a rack Pt endorses 100% compliance  We discussed: Benefits of medication synchronization, packaging and delivery as well as enhanced pharmacist oversight with [employer]. Patient decided to: Continue current medication management strategy  Care Plan and Follow Up Patient Decision:  Patient agrees to Care Plan and Follow-up.  Plan: Telephone follow up  appointment with care management team member scheduled for:  4-5 months   BP assessment in 2 months  MadeJeni SallesarmD BCACPottery Additionrmacist Le[place]oulter[place]ras[place]ills[place]-[phone] up 4-5

## 2020-10-27 DIAGNOSIS — C678 Malignant neoplasm of overlapping sites of bladder: Secondary | ICD-10-CM | POA: Diagnosis not present

## 2020-10-27 DIAGNOSIS — R338 Other retention of urine: Secondary | ICD-10-CM | POA: Diagnosis not present

## 2020-10-29 ENCOUNTER — Other Ambulatory Visit: Payer: Self-pay | Admitting: Urology

## 2020-11-04 ENCOUNTER — Encounter (HOSPITAL_COMMUNITY): Payer: Self-pay | Admitting: Cardiology

## 2020-11-04 ENCOUNTER — Other Ambulatory Visit: Payer: Self-pay

## 2020-11-04 ENCOUNTER — Ambulatory Visit (HOSPITAL_COMMUNITY)
Admission: RE | Admit: 2020-11-04 | Discharge: 2020-11-04 | Disposition: A | Discharge status: Home / Self Care | Payer: PPO | Source: Ambulatory Visit | Attending: Cardiology | Admitting: Cardiology

## 2020-11-04 VITALS — BP 138/76 | HR 60 | Ht 66.5 in | Wt 276.0 lb

## 2020-11-04 DIAGNOSIS — I110 Hypertensive heart disease with heart failure: Secondary | ICD-10-CM | POA: Insufficient documentation

## 2020-11-04 DIAGNOSIS — I350 Nonrheumatic aortic (valve) stenosis: Secondary | ICD-10-CM | POA: Insufficient documentation

## 2020-11-04 DIAGNOSIS — G4733 Obstructive sleep apnea (adult) (pediatric): Secondary | ICD-10-CM | POA: Diagnosis not present

## 2020-11-04 DIAGNOSIS — Z87891 Personal history of nicotine dependence: Secondary | ICD-10-CM | POA: Diagnosis not present

## 2020-11-04 DIAGNOSIS — I712 Thoracic aortic aneurysm, without rupture: Secondary | ICD-10-CM | POA: Insufficient documentation

## 2020-11-04 DIAGNOSIS — Z8719 Personal history of other diseases of the digestive system: Secondary | ICD-10-CM | POA: Insufficient documentation

## 2020-11-04 DIAGNOSIS — Z8249 Family history of ischemic heart disease and other diseases of the circulatory system: Secondary | ICD-10-CM | POA: Diagnosis not present

## 2020-11-04 DIAGNOSIS — Z79899 Other long term (current) drug therapy: Secondary | ICD-10-CM | POA: Insufficient documentation

## 2020-11-04 DIAGNOSIS — I2510 Atherosclerotic heart disease of native coronary artery without angina pectoris: Secondary | ICD-10-CM | POA: Insufficient documentation

## 2020-11-04 DIAGNOSIS — C679 Malignant neoplasm of bladder, unspecified: Secondary | ICD-10-CM | POA: Insufficient documentation

## 2020-11-04 DIAGNOSIS — E785 Hyperlipidemia, unspecified: Secondary | ICD-10-CM | POA: Insufficient documentation

## 2020-11-04 DIAGNOSIS — Z9221 Personal history of antineoplastic chemotherapy: Secondary | ICD-10-CM | POA: Diagnosis not present

## 2020-11-04 DIAGNOSIS — Z7982 Long term (current) use of aspirin: Secondary | ICD-10-CM | POA: Insufficient documentation

## 2020-11-04 DIAGNOSIS — I5032 Chronic diastolic (congestive) heart failure: Secondary | ICD-10-CM | POA: Insufficient documentation

## 2020-11-04 DIAGNOSIS — I714 Abdominal aortic aneurysm, without rupture, unspecified: Secondary | ICD-10-CM

## 2020-11-04 LAB — BASIC METABOLIC PANEL
Anion gap: 6 (ref 5–15)
BUN: 19 mg/dL (ref 8–23)
CO2: 26 mmol/L (ref 22–32)
Calcium: 8.8 mg/dL — ABNORMAL LOW (ref 8.9–10.3)
Chloride: 105 mmol/L (ref 98–111)
Creatinine, Ser: 1.06 mg/dL (ref 0.61–1.24)
GFR, Estimated: >60 mL/min (ref >60)
Glucose, Bld: 115 mg/dL — ABNORMAL HIGH (ref 70–99)
Potassium: 4 mmol/L (ref 3.5–5.1)
Sodium: 137 mmol/L (ref 135–145)

## 2020-11-04 LAB — LIPID PANEL
Cholesterol: 146 mg/dL (ref 0–200)
HDL: 45 mg/dL (ref >40)
LDL Cholesterol: 72 mg/dL (ref 0–99)
Total CHOL/HDL Ratio: 3.2 RATIO
Triglycerides: 147 mg/dL (ref <150)
VLDL: 29 mg/dL (ref 0–40)

## 2020-11-04 LAB — BRAIN NATRIURETIC PEPTIDE: B Natriuretic Peptide: 9.5 pg/mL (ref 0.0–100.0)

## 2020-11-04 MED ORDER — CARVEDILOL 6.25 MG PO TABS: 6.2500 mg | ORAL_TABLET | Freq: Two times a day (BID) | ORAL | 3 refills | Status: DC

## 2020-11-04 MED ORDER — FUROSEMIDE 40 MG PO TABS: ORAL_TABLET | ORAL | 0 refills | Status: DC

## 2020-11-04 NOTE — Patient Instructions (Signed)
Increase Lasix to 40 mg in the AM and 20mg  in thePM  START coreg 6.25 mg Twice daily   Labs done today, your results will be available in MyChart, we will contact you for abnormal readings.  Follow up in 10 days for [facility]  Your physician has requested that you have an echocardiogram. Echocardiography is a painless test that uses sound waves to create images of your heart. It provides your doctor with information about the size and shape of your heart and how well your heart's chambers and valves are working. This procedure takes approximately one hour. There are no restrictions for this procedure.  CTA chest  for evaluate AAA, Insurance has to approve,we will call to schedule an appointment Echo for January 2023 If you have any questions or concerns before your next appointment please send [place] a message through [place] or call our office at [phone].    TO LEAVE A MESSAGE FOR THE NURSE SELECT OPTION 2, PLEASE LEAVE A MESSAGE INCLUDING: YOUR NAME DATE OF BIRTH CALL BACK NUMBER REASON FOR CALL**this is important as we prioritize the call backs  YOU WILL RECEIVE A CALL BACK THE SAME DAY AS LONG AS YOU CALL BEFORE 4:00 PM  milAt the [facility], you and your health needs are our priority. As part of our continuing mission to provide you with exceptional heart care, we have created designated Provider Care Teams. These Care Teams include your primary Cardiologist (physician) and Advanced Practice Providers (APPs- Physician Assistants and Nurse Practitioners) who all work together to provide you with the care you need, when you need it.   You may see any of the following providers on your designated Care Team at your next follow up: Dr Glori Bickers Dr [name] Dr Patrice Paradise, NP Lyda Jester, [place] Ginnie Smart Audry Riles, PharmD   Please be sure to bring in all your medications bottles to every appointment.

## 2020-11-04 NOTE — Progress Notes (Signed)
Patient ID: Luis [name], male   DOB: [birth year], 65 y.o.   MRN: [id number] PCP: Dr. Sarajane Jews Cardiology: Dr. Aundra Dubin  65 y.o. with history of CAD and moderate aortic stenosis presents for follow of AS and CAD. He had RCA PCI in 8/12.     When I saw him in early 2016, he reported significant exertional dyspnea.  I had him do a Lexiscan Cardiolite in 2/16.  This showed no ischemia or infarction.  I also had him get an echo that showed ?bicuspid aortic valve and probably moderate aortic stenosis.  Given ongoing symptoms, I had him do a RHC/LHC and TEE in 2/16.  The RHC showed mildly elevated left and right heart filling pressures and the LHC showed nonobstructive CAD.  TEE confirmed bicuspid aortic valve with moderate AS.  He had PFTs in 4/16 that were within normal limits.  I started him on Lasix 20 mg daily.  He seemed to improve.    Progressive dypnea in fall 2017.  RHC/LHC in 2017 showed mildly elevated left heart filling pressure and nonobstructive CAD.  TEE showed moderate aortic stenosis, bicuspid valve.  Stable ascending aorta dilation.   Echo in 9/18 showed EF 60-65% with bicuspid aortic valve and moderate AS.  CTA chest showed 4.5 cm ascending aorta.  Repeat CTA chest in 8/19 showed stable 4.5 cm ascending aorta.  Echo in 8/20 showed EF 60-65%, moderate AS, 4.5 cm ascending aorta. CTA chest in 8/20 with 4.6 cm ascending aorta. Echo in 1/22 showed EF 60-65%, mild LVH, moderate AS with mean gradient 32 and AVA 1.6 cm^2, bicuspid aortic valve, ascending aorta 46 mm.   He is being treated for bladder cancer with chemotherapy and TURBT every 3 months.  Recent TURBT in 6/22 showed recurrence of bladder cancer, plan for intravesicle chemotherapy again.   Weight up 5 lbs since last appointment.  He reports dyspnea walking up stairs and hills, says that his legs have been swelling.  No dyspnea walking on flat ground.  No chest pain.  Not getting a lot of exercise.  No orthopnea/PND.   Uses CPAP regularly.  BP remains mildly elevated.   Labs (2/15): K 4.4, creatinine 0.9, LDL 71, HDL 53 Labs (2/16): K 4.8, creatinine 1.0, LDL 62, HDL 57 Labs (8/16): K 4.7, creatinine 0.95, TSH normal, HCT 41.2, LDL 78, HDL 58 Labs (8/17): K 4.3, creatinine 1.05, HCT 40.1, TSH normal, BNP 56 Labs (9/17): creatinine 1.08, BNP 56 Labs (7/18): creatinine 1.2, LDL 53 Labs (7/19): LDL 68 Labs (8/19): K 5, creatinine 1.09 Labs (8/20): K 4.9, creatinine 1.09, LDL 59, HDL 50 Labs (8/21): K 4.6, creatinine 1.0 Labs (1/22): K 4, creatinine 1.02, LDL 70  ECG (personally reviewed): NSR, LVH  PMH: 1. OSA: Using CPAP.  2. CAD: Cardiolite 8/12 with inferior infarct and peri-infarct ischemia, had PCI to the mid and distal RCA with Promus DES x 2.  Lexiscan Cardiolite (2/16) with no ischemia or infarction. LHC/RHC (2/16) with mean RA 12, PA 32/15, mean PCWP 18, CI 3.47; patent mid and distal RCA stents, 50-60% proximal stenosis small PDA.   - LHC/RHC (9/17): 60% proximal PDA; mean RA 2, PA 24/8, mean PCWP 18, CI 2.13.  - Cardiolite (8/17): EF 56%, normal.  3. Carotid stenosis: 2/15 carotid dopplers 1-39% bilateral ICA stenosis.  4. Colon polyps 5. HTN 6. Hyperlipidemia 7. Abdominal US (10/12) with no AAA 8. Aortic stenosis: Echo (2/15) with EF 60-65%, mild LVH, moderate diastolic dysfunction, normal RV size/systolic function, moderate aortic  stenosis with mean gradient 31 and AVA 1.16 cm2, ascending aorta 4.4 cm.  Echo (2/16) with EF 60-65%, grade II diastolic dysfunction, ?bicuspid aortic valve with moderate AS (mean gradient 35 mmHg, AVA 1.5 cm^2), mildly dilated RV with normal systolic function.  TEE (2/16) with bicuspid aortic valve, moderate AS mean gradient 30 mmHg, AVA > 1 cm^2, EF 55-60%, mild LVH, mildly dilated RV with normal systolic function, mild AI, mild MR, ascending aorta 4.2 cm.  - Echo (8/17): EF 60-65%, grade II diastolic dysfunction, bicuspid aortic valve with mean gradient 31 mmHg, probably moderate  stenosis.  - TEE (9/17): EF 55-60%, bicuspid aortic valve with moderate AS with mean gradient 24 mmHg and AVA 1.05 cm^2, ascending aorta 4.2 cm.  - Echo (9/18): EF 60-65%, bicuspid aortic valve, AVA 1.05 cm^2 with mean gradient 24 mmHg (moderate AS).  - Echo (8/20): EF 60-65%, mild RV dilation with normal systolic function, moderate AS with AVA 1.46 cm^2 and mean gradient 36 mmHg, 4.5 cm ascending aorta.  - Echo (1/22): EF 60-65%, mild LVH, moderate AS with mean gradient 32 and AVA 1.6 cm^2, bicuspid aortic valve, ascending aorta 46 mm.  9. Ascending aorta aneurysm: 4.4 cm on 2/15 echo. Ascending aorta 4.2 cm TEE 2/16.  Ascending aorta 4.2 cm on 9/17 TEE.  - CTA chest (9/18): 4.5 cm ascending aorta.  - CTA chest (8/19): 4.5 cm ascending aorta - CTA chest (8/20): 4.6 cm ascending aorta 10. Chronic diastolic CHF.  11. PFTs (4/16) were within normal limits.  12. Lower extremity arterial dopplers normal in 8/17.  13. Bladder cancer: S/p chemo and TURBT  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  Sister with RyR2 gene.   ROS: All systems reviewed and negative except as per HPI.   Current Outpatient Medications  Medication Sig Dispense Refill   acetaminophen (TYLENOL) 325 MG tablet Take 650 mg by mouth every 6 (six) hours as needed.     aspirin 81 MG tablet Take 81 mg by mouth every morning.     carvedilol (COREG) 6.25 MG tablet Take 1 tablet (6.25 mg total) by mouth 2 (two) times daily. 180 tablet 3   diazepam (VALIUM) 5 MG tablet TAKE 1 TABLET (5 MG TOTAL) BY MOUTH EVERY 12 (TWELVE) HOURS AS NEEDED FOR ANXIETY. 60 tablet 5   ipratropium (ATROVENT) 0.03 % nasal spray Place 2 sprays into both nostrils every 12 (twelve) hours as needed for rhinitis.     losartan (COZAAR) 50 MG tablet Take 1 tablet (50 mg total) by mouth in the morning and at bedtime. 180 tablet 3   MAGNESIUM PO Take 500 mg by mouth daily.     meclizine (ANTIVERT) 25 MG tablet Take 1 tablet  (25 mg total) by mouth 3 (three) times daily as needed for dizziness. 30 tablet 0   nitroGLYCERIN (NITROSTAT) 0.4 MG SL tablet Place 1 tablet (0.4 mg total) under the tongue every 5 (five) minutes as needed. For chest pain. 25 tablet 2   rosuvastatin (CRESTOR) 20 MG tablet TAKE 1 TABLET BY MOUTH EVERYDAY AT BEDTIME 90 tablet 3   tamsulosin (FLOMAX) 0.4 MG CAPS capsule Take 0.4 mg by mouth daily.     traMADol (ULTRAM) 50 MG tablet Take 1-2 tablets (50-100 mg total) by mouth every 6 (six) hours as needed for moderate pain (post-operatively). 15 tablet 0   venlafaxine XR (EFFEXOR-XR) 150 MG 24 hr capsule TAKE 1 CAPSULE BY MOUTH DAILY WITH BREAKFAST. [place]  capsule 1   furosemide (LASIX) 40 MG tablet Take 1 tablet (40 mg total) by mouth daily AND 0.5 tablets (20 mg total) every evening. 90 tablet 0   Current Facility-Administered Medications  Medication Dose Route Frequency Provider Last Rate Last Admin   traMADol (ULTRAM) tablet 50-100 mg  50-100 mg Oral Q6H PRN Alexis Frock, MD       BP 138/76   Pulse 60   Ht 5' 6.5" (1.689 m)   Wt 125.2 kg (276 lb)   SpO2 94%   BMI 43.88 kg/m  General: NAD Neck: JVP 8-9 cm, no thyromegaly or thyroid nodule.  Lungs: Clear to auscultation bilaterally with normal respiratory effort. CV: Nondisplaced PMI.  Heart regular S1/S2, no S3/S4, 3/6 SEM RUSB with obscured S2  1+ ankle edema.  No carotid bruit.  Normal pedal pulses.  Abdomen: Soft, nontender, no hepatosplenomegaly, no distention.  Skin: Intact without lesions or rashes.  Neurologic: Alert and oriented x 3.  Psych: Normal affect. Extremities: No clubbing or cyanosis.  HEENT: Normal.   Assessment/Plan: 1. CAD: Prior PCI to RCA in 8/12.  LHC (9/17) with nonobstructive disease.  I had started him on Imdur for ?microvascular angina but he developed severe headaches without any improvement in symptoms.  No chest pain now.  - Continue ASA 81 and Crestor.  2. Aortic stenosis: Bicuspid valve with moderate AS  by echo in 1/22.   - Repeat echo for progression in 1/23.     3. Hyperlipidemia: Good lipids 1/22.  4. Ascending aortic aneurysm: Associated with bicuspid aortic valve.   - I will arrange for CTA chest for full assessment of ascending aortic aneurysm, should have been done after last appointment but looks like it was not scheduled.   5. Chronic diastolic CHF:  NYHA class II-III symptoms, mild volume overload on exam.    - Increase Lasix to 40 qam/20 qpm with BMET today and in 10 days.  - Add SGLT2 inhibitor in future.   6. OSA: Continue CPAP.  7. HTN: With ascending aortic aneurysm, need good BP control.  BP mildly elevated today.  - Continue losartan 50 mg bid.  - Will start beta blocker given ascending aortic aneurysm, start Coreg 6.25 mg bid.   Followup with APP in 2 months.   [name] 11/04/2020

## 2020-11-10 DIAGNOSIS — G4733 Obstructive sleep apnea (adult) (pediatric): Secondary | ICD-10-CM | POA: Diagnosis not present

## 2020-11-18 ENCOUNTER — Other Ambulatory Visit (HOSPITAL_BASED_OUTPATIENT_CLINIC_OR_DEPARTMENT_OTHER): Payer: PPO

## 2020-11-23 ENCOUNTER — Ambulatory Visit (HOSPITAL_BASED_OUTPATIENT_CLINIC_OR_DEPARTMENT_OTHER)
Admission: RE | Admit: 2020-11-23 | Discharge: 2020-11-23 | Disposition: A | Discharge status: Home / Self Care | Payer: PPO | Source: Ambulatory Visit | Attending: Cardiology | Admitting: Cardiology

## 2020-11-23 ENCOUNTER — Other Ambulatory Visit: Payer: Self-pay

## 2020-11-23 DIAGNOSIS — I714 Abdominal aortic aneurysm, without rupture, unspecified: Secondary | ICD-10-CM

## 2020-11-23 DIAGNOSIS — I712 Thoracic aortic aneurysm, without rupture: Secondary | ICD-10-CM | POA: Diagnosis not present

## 2020-11-23 DIAGNOSIS — I5032 Chronic diastolic (congestive) heart failure: Secondary | ICD-10-CM

## 2020-11-23 LAB — BASIC METABOLIC PANEL
Anion gap: 7 (ref 5–15)
BUN: 17 mg/dL (ref 8–23)
CO2: 26 mmol/L (ref 22–32)
Calcium: 8.6 mg/dL — ABNORMAL LOW (ref 8.9–10.3)
Chloride: 104 mmol/L (ref 98–111)
Creatinine, Ser: 1.15 mg/dL (ref 0.61–1.24)
GFR, Estimated: >60 mL/min (ref >60)
Glucose, Bld: 133 mg/dL — ABNORMAL HIGH (ref 70–99)
Potassium: 3.6 mmol/L (ref 3.5–5.1)
Sodium: 137 mmol/L (ref 135–145)

## 2020-11-23 MED ORDER — IOHEXOL 350 MG/ML SOLN
80.0000 mL | Freq: Once | INTRAVENOUS | Status: AC | PRN
  Administered 2020-11-23: 75 mL via INTRAVENOUS

## 2020-11-26 ENCOUNTER — Other Ambulatory Visit (HOSPITAL_COMMUNITY): Payer: Self-pay | Admitting: Cardiology

## 2020-12-14 ENCOUNTER — Other Ambulatory Visit: Payer: Self-pay

## 2020-12-14 ENCOUNTER — Encounter (HOSPITAL_BASED_OUTPATIENT_CLINIC_OR_DEPARTMENT_OTHER): Payer: Self-pay | Admitting: Urology

## 2020-12-14 NOTE — Progress Notes (Signed)
Spoke w/ via phone for pre-op interview---pt Lab needs dos---- I stat 8              Lab results------see below COVID test -----patient states asymptomatic no test needed Arrive at -------830 am 12-16-2020 NPO after MN NO Solid Food.  Clear liquids from MN until--- 730  am then npo Med rec completed Medications to take morning of surgery -----diazepam prn, venlafaxine, tamsulosin, carvedilol Diabetic medication -----n/a Patient instructed to bring photo id and insurance card day of surgery Patient aware to have Driver (ride ) / caregiver wife gayle will stay    for 24 hours after surgery  Patient Special Instructions -----bring cpap maks tubing and machine and leave in car Pre-Op special Istructions -----none Patient verbalized understanding of instructions that were given at this phone interview. Patient denies shortness of breath, chest pain, fever, cough at this phone interview.   Anesthesia Review: chart reviewed by Audria Nine and [place] spoke with dr germeroth and dr Noreene Larsson on 10-18-2019 see prgress notes jessica zanetto pa and denise sexton rn 62-08-2019 barring acute status changes pt ok for lwc. Had 10-14-2020 surgery  and 2 other surgeries at [facility] without problems. Hx of chf, cad and moderate aortic stenosis, mild lvh, had right pci 11-2010, 4.6 cm ascending aortic aneurysm, mi 2012, osa uses cpap,  pt states no cardiac s & s , no sob at pre op call, has sob with heavy exertion  PCP: dr Nydia Bouton Cardiologist :dr Barrie Lyme 11-04-2020 epic Chest  [facility] ct 11-23-2020 epic EKG :11-04-2020 epic Echo :04-28-2020 epic Stress test: 01-18-2016 epic Cardiac Cath : 01-21-2016 epic Activity level: able to walk up stairs and do household activities without problems. Sleep Study/ CPAP :uses cpap does not know cpap settings Blood Thinner/ Instructions Maryjane Hurter Dose: n/a ASA / Instructions/ Last Dose :  pt stopping 81 mg aspirin new patient choice [facility] dr Mordecai Rasmussen 07-07-2020 epic

## 2020-12-15 MED ORDER — GENTAMICIN SULFATE 40 MG/ML IJ SOLN
450.0000 mg | INTRAVENOUS | Status: AC
  Administered 2020-12-16: 450 mg via INTRAVENOUS
  Filled 2020-12-15: qty 11.25

## 2020-12-16 ENCOUNTER — Encounter (HOSPITAL_BASED_OUTPATIENT_CLINIC_OR_DEPARTMENT_OTHER)
Admission: RE | Disposition: A | Discharge status: Home / Self Care | Payer: Self-pay | Source: Home / Self Care | Attending: Urology

## 2020-12-16 ENCOUNTER — Ambulatory Visit (HOSPITAL_BASED_OUTPATIENT_CLINIC_OR_DEPARTMENT_OTHER): Payer: PPO | Admitting: Certified Registered Nurse Anesthetist

## 2020-12-16 ENCOUNTER — Other Ambulatory Visit: Payer: Self-pay

## 2020-12-16 ENCOUNTER — Encounter (HOSPITAL_BASED_OUTPATIENT_CLINIC_OR_DEPARTMENT_OTHER): Payer: Self-pay | Admitting: Urology

## 2020-12-16 ENCOUNTER — Ambulatory Visit (HOSPITAL_BASED_OUTPATIENT_CLINIC_OR_DEPARTMENT_OTHER)
Admission: RE | Admit: 2020-12-16 | Discharge: 2020-12-16 | Disposition: A | Discharge status: Home / Self Care | Payer: PPO | Attending: Urology | Admitting: Urology

## 2020-12-16 DIAGNOSIS — D0919 Carcinoma in situ of other urinary organs: Secondary | ICD-10-CM | POA: Diagnosis not present

## 2020-12-16 DIAGNOSIS — Z6841 Body Mass Index (BMI) 40.0 and over, adult: Secondary | ICD-10-CM | POA: Insufficient documentation

## 2020-12-16 DIAGNOSIS — Z885 Allergy status to narcotic agent status: Secondary | ICD-10-CM | POA: Insufficient documentation

## 2020-12-16 DIAGNOSIS — Z08 Encounter for follow-up examination after completed treatment for malignant neoplasm: Secondary | ICD-10-CM | POA: Insufficient documentation

## 2020-12-16 DIAGNOSIS — D075 Carcinoma in situ of prostate: Secondary | ICD-10-CM | POA: Insufficient documentation

## 2020-12-16 DIAGNOSIS — R338 Other retention of urine: Secondary | ICD-10-CM | POA: Diagnosis not present

## 2020-12-16 DIAGNOSIS — I110 Hypertensive heart disease with heart failure: Secondary | ICD-10-CM | POA: Diagnosis not present

## 2020-12-16 DIAGNOSIS — Z8551 Personal history of malignant neoplasm of bladder: Secondary | ICD-10-CM | POA: Diagnosis not present

## 2020-12-16 DIAGNOSIS — E669 Obesity, unspecified: Secondary | ICD-10-CM | POA: Diagnosis not present

## 2020-12-16 DIAGNOSIS — D090 Carcinoma in situ of bladder: Secondary | ICD-10-CM | POA: Diagnosis not present

## 2020-12-16 DIAGNOSIS — Z87891 Personal history of nicotine dependence: Secondary | ICD-10-CM | POA: Insufficient documentation

## 2020-12-16 DIAGNOSIS — C679 Malignant neoplasm of bladder, unspecified: Secondary | ICD-10-CM | POA: Diagnosis not present

## 2020-12-16 DIAGNOSIS — I5032 Chronic diastolic (congestive) heart failure: Secondary | ICD-10-CM | POA: Diagnosis not present

## 2020-12-16 DIAGNOSIS — N401 Enlarged prostate with lower urinary tract symptoms: Secondary | ICD-10-CM | POA: Insufficient documentation

## 2020-12-16 DIAGNOSIS — I25119 Atherosclerotic heart disease of native coronary artery with unspecified angina pectoris: Secondary | ICD-10-CM | POA: Diagnosis not present

## 2020-12-16 DIAGNOSIS — N3289 Other specified disorders of bladder: Secondary | ICD-10-CM | POA: Diagnosis not present

## 2020-12-16 HISTORY — PX: TRANSURETHRAL RESECTION OF BLADDER TUMOR: SHX2575

## 2020-12-16 HISTORY — PX: CYSTOSCOPY W/ RETROGRADES: SHX1426

## 2020-12-16 LAB — POCT I-STAT, CHEM 8
BUN: 22 mg/dL (ref 8–23)
Calcium, Ion: 1.17 mmol/L (ref 1.15–1.40)
Chloride: 103 mmol/L (ref 98–111)
Creatinine, Ser: 0.9 mg/dL (ref 0.61–1.24)
Glucose, Bld: 118 mg/dL — ABNORMAL HIGH (ref 70–99)
HCT: 36 % — ABNORMAL LOW (ref 39.0–52.0)
Hemoglobin: 12.2 g/dL — ABNORMAL LOW (ref 13.0–17.0)
Potassium: 4 mmol/L (ref 3.5–5.1)
Sodium: 140 mmol/L (ref 135–145)
TCO2: 26 mmol/L (ref 22–32)

## 2020-12-16 SURGERY — TURBT (TRANSURETHRAL RESECTION OF BLADDER TUMOR)
Anesthesia: General | Site: Renal

## 2020-12-16 MED ORDER — FENTANYL CITRATE (PF) 100 MCG/2ML IJ SOLN: 25.0000 ug | INTRAMUSCULAR | Status: DC | PRN

## 2020-12-16 MED ORDER — LIDOCAINE 2% (20 MG/ML) 5 ML SYRINGE
INTRAMUSCULAR | Status: DC | PRN
  Administered 2020-12-16: 60 mg via INTRAVENOUS

## 2020-12-16 MED ORDER — FENTANYL CITRATE (PF) 100 MCG/2ML IJ SOLN
INTRAMUSCULAR | Status: DC | PRN
  Administered 2020-12-16 (×4): 25 ug via INTRAVENOUS

## 2020-12-16 MED ORDER — EPHEDRINE SULFATE-NACL 50-0.9 MG/10ML-% IV SOSY
PREFILLED_SYRINGE | INTRAVENOUS | Status: DC | PRN
  Administered 2020-12-16 (×5): 10 mg via INTRAVENOUS

## 2020-12-16 MED ORDER — IOHEXOL 300 MG/ML  SOLN
INTRAMUSCULAR | Status: DC | PRN
  Administered 2020-12-16: 10 mL

## 2020-12-16 MED ORDER — DEXAMETHASONE SODIUM PHOSPHATE 10 MG/ML IJ SOLN
INTRAMUSCULAR | Status: DC | PRN
  Administered 2020-12-16: 4 mg via INTRAVENOUS

## 2020-12-16 MED ORDER — SODIUM CHLORIDE 0.9 % IR SOLN
Status: DC | PRN
  Administered 2020-12-16 (×2): 3000 mL

## 2020-12-16 MED ORDER — ONDANSETRON HCL 4 MG/2ML IJ SOLN
INTRAMUSCULAR | Status: DC | PRN
  Administered 2020-12-16: 4 mg via INTRAVENOUS

## 2020-12-16 MED ORDER — MIDAZOLAM HCL 2 MG/2ML IJ SOLN
INTRAMUSCULAR | Status: AC
  Filled 2020-12-16: qty 2

## 2020-12-16 MED ORDER — PROPOFOL 10 MG/ML IV BOLUS
INTRAVENOUS | Status: DC | PRN
  Administered 2020-12-16: 100 mg via INTRAVENOUS
  Administered 2020-12-16: 50 mg via INTRAVENOUS

## 2020-12-16 MED ORDER — PHENYLEPHRINE 40 MCG/ML (10ML) SYRINGE FOR IV PUSH (FOR BLOOD PRESSURE SUPPORT)
PREFILLED_SYRINGE | INTRAVENOUS | Status: DC | PRN
  Administered 2020-12-16 (×2): 80 ug via INTRAVENOUS

## 2020-12-16 MED ORDER — 0.9 % SODIUM CHLORIDE (POUR BTL) OPTIME
TOPICAL | Status: DC | PRN
  Administered 2020-12-16: 500 mL

## 2020-12-16 MED ORDER — DEXAMETHASONE SODIUM PHOSPHATE 10 MG/ML IJ SOLN
INTRAMUSCULAR | Status: AC
  Filled 2020-12-16: qty 1

## 2020-12-16 MED ORDER — LACTATED RINGERS IV SOLN: INTRAVENOUS | Status: DC

## 2020-12-16 MED ORDER — TRAMADOL HCL 50 MG PO TABS: 50.0000 mg | ORAL_TABLET | Freq: Four times a day (QID) | ORAL | 0 refills | Status: DC | PRN

## 2020-12-16 MED ORDER — PROPOFOL 500 MG/50ML IV EMUL
INTRAVENOUS | Status: AC
  Filled 2020-12-16: qty 50

## 2020-12-16 MED ORDER — PHENYLEPHRINE 40 MCG/ML (10ML) SYRINGE FOR IV PUSH (FOR BLOOD PRESSURE SUPPORT)
PREFILLED_SYRINGE | INTRAVENOUS | Status: AC
  Filled 2020-12-16: qty 10

## 2020-12-16 MED ORDER — GLYCOPYRROLATE PF 0.2 MG/ML IJ SOSY
PREFILLED_SYRINGE | INTRAMUSCULAR | Status: DC | PRN
  Administered 2020-12-16: .2 mg via INTRAVENOUS

## 2020-12-16 MED ORDER — FENTANYL CITRATE (PF) 100 MCG/2ML IJ SOLN
INTRAMUSCULAR | Status: AC
  Filled 2020-12-16: qty 2

## 2020-12-16 MED ORDER — EPHEDRINE 5 MG/ML INJ
INTRAVENOUS | Status: AC
  Filled 2020-12-16: qty 5

## 2020-12-16 MED ORDER — MIDAZOLAM HCL 5 MG/5ML IJ SOLN
INTRAMUSCULAR | Status: DC | PRN
  Administered 2020-12-16: 2 mg via INTRAVENOUS

## 2020-12-16 MED ORDER — ONDANSETRON HCL 4 MG/2ML IJ SOLN
INTRAMUSCULAR | Status: AC
  Filled 2020-12-16: qty 2

## 2020-12-16 MED ORDER — LIDOCAINE HCL (PF) 2 % IJ SOLN
INTRAMUSCULAR | Status: AC
  Filled 2020-12-16: qty 5

## 2020-12-16 MED ORDER — GLYCOPYRROLATE PF 0.2 MG/ML IJ SOSY
PREFILLED_SYRINGE | INTRAMUSCULAR | Status: AC
  Filled 2020-12-16: qty 1

## 2020-12-16 SURGICAL SUPPLY — 33 items
BAG DRAIN URO-CYSTO SKYTR STRL (DRAIN) ×4 IMPLANT
BAG DRN RND TRDRP ANRFLXCHMBR (UROLOGICAL SUPPLIES)
BAG DRN UROCATH (DRAIN) ×2
BAG URINE DRAIN 2000ML AR STRL (UROLOGICAL SUPPLIES) IMPLANT
BAG URINE LEG 500ML (DRAIN) IMPLANT
CATH FOLEY 2WAY SLVR  5CC 22FR (CATHETERS)
CATH FOLEY 2WAY SLVR 30CC 20FR (CATHETERS) IMPLANT
CATH FOLEY 2WAY SLVR 5CC 22FR (CATHETERS) IMPLANT
CATH INTERMIT  6FR 70CM (CATHETERS) ×4 IMPLANT
CLOTH BEACON ORANGE TIMEOUT ST (SAFETY) ×4 IMPLANT
ELECT REM PT RETURN 9FT ADLT (ELECTROSURGICAL)
ELECTRODE REM PT RTRN 9FT ADLT (ELECTROSURGICAL) IMPLANT
EVACUATOR MICROVAS BLADDER (UROLOGICAL SUPPLIES) IMPLANT
GLOVE SURG ENC MOIS LTX SZ7.5 (GLOVE) ×4 IMPLANT
GLOVE SURG UNDER POLY LF SZ6 (GLOVE) ×8 IMPLANT
GLOVE SURG UNDER POLY LF SZ7 (GLOVE) ×4 IMPLANT
GOWN STRL REUS W/TWL LRG LVL3 (GOWN DISPOSABLE) ×12 IMPLANT
GUIDEWIRE ANG ZIPWIRE 038X150 (WIRE) IMPLANT
GUIDEWIRE STR DUAL SENSOR (WIRE) IMPLANT
IV NS 1000ML (IV SOLUTION)
IV NS 1000ML BAXH (IV SOLUTION) IMPLANT
IV NS IRRIG 3000ML ARTHROMATIC (IV SOLUTION) ×12 IMPLANT
KIT TURNOVER CYSTO (KITS) ×4 IMPLANT
LOOP CUT BIPOLAR 24F LRG (ELECTROSURGICAL) ×4 IMPLANT
MANIFOLD NEPTUNE II (INSTRUMENTS) ×4 IMPLANT
NS IRRIG 500ML POUR BTL (IV SOLUTION) ×8 IMPLANT
PACK CYSTO (CUSTOM PROCEDURE TRAY) ×4 IMPLANT
SYR 10ML LL (SYRINGE) IMPLANT
SYR TOOMEY IRRIG 70ML (MISCELLANEOUS)
SYRINGE TOOMEY IRRIG 70ML (MISCELLANEOUS) IMPLANT
TUBE CONNECTING 12'X1/4 (SUCTIONS)
TUBE CONNECTING 12X1/4 (SUCTIONS) IMPLANT
TUBING UROLOGY SET (TUBING) ×4 IMPLANT

## 2020-12-16 NOTE — Transfer of Care (Signed)
Immediate Anesthesia Transfer of Care Note  Patient: Luis [name]  Procedure(s) Performed: RESTAGING TRANSURETHRAL RESECTION OF BLADDER TUMOR (TURBT) (Bladder) CYSTOSCOPY WITH RETROGRADE PYELOGRAM (Bilateral: Renal)  Patient Location: PACU  Anesthesia Type:General  Level of Consciousness: awake, alert  and oriented  Airway & Oxygen Therapy: Patient Spontanous Breathing and Patient connected to face mask oxygen  Post-op Assessment: Report given to RN and Post -op Vital signs reviewed and stable  Post vital signs: Reviewed and stable  Last Vitals:  Vitals Value Taken Time  BP 122/66 12/16/20 1051  Temp    Pulse 57 12/16/20 1052  Resp 12 12/16/20 1052  SpO2 100 % 12/16/20 1052  Vitals shown include unvalidated device data.  Last Pain:  Vitals:   12/16/20 0843  TempSrc: Oral  PainSc: 0-No pain         Complications: No notable events documented.

## 2020-12-16 NOTE — Anesthesia Postprocedure Evaluation (Signed)
Anesthesia Post Note  Patient: KALEY MANJARRES  Procedure(s) Performed: RESTAGING TRANSURETHRAL RESECTION OF BLADDER TUMOR (TURBT) (Bladder) CYSTOSCOPY WITH RETROGRADE PYELOGRAM (Bilateral: Renal)     Patient location during evaluation: PACU Anesthesia Type: General Level of consciousness: awake Pain management: pain level controlled Vital Signs Assessment: post-procedure vital signs reviewed and stable Respiratory status: spontaneous breathing Cardiovascular status: stable Postop Assessment: no apparent nausea or vomiting Anesthetic complications: no   No notable events documented.  Last Vitals:  Vitals:   12/16/20 1130 12/16/20 1145  BP: (!) 111/58 110/78  Pulse: (!) 54 (!) 52  Resp: 12 16  Temp:  36.6 C  SpO2: 98% 99%    Last Pain:  Vitals:   12/16/20 1145  TempSrc:   PainSc: 0-No pain                 Michah Minton

## 2020-12-16 NOTE — H&P (Signed)
Luis [name] is an 65 y.o. male.    Chief Complaint: Pre-Op Cysto, Retrogrades, Restaging Transurethral Resection of Bladder Tumor  HPI:   1 - High Grade Bladder Cancer - T1G3 by TURBT + gemcitabine 09/2019 by Karsten Ro for small left wall tumor, no muscle in specimen on eval gross hematuria. CT w/o upper tract lesions. Restaging 11/2019 with CIS only, no gross disease. Induction BCG x 6 ending 01/2020.   Recent Surveillance:  03/2020 cysto - old resection site left bladder neck noted.  06/2020 cysto - no recurrence; 09/2020 cysto - left bladder neck 1.5cm recurrence high-grade appearing ==> TURBT T1G3   2 - Urinary Retention - on/off uirnary retention 2021. Prostate vol 12m (fiarly normal for age) by CT ellipsoid calculation 2021. Placed on tamsulosin and passed trial of void 10/2019. FU PVR 11/2019 "052m   3 - Non-Complex Left Renal Cyst - about 2cm left mid cyst w/o sig enhancement or mass effect incidetnal on hemturia CDT 2021.   4 - Prostate Screening - No FHX prostate cancer  2016 - PSA 1.67  2022 - PSA 2.26   PMH sig for obesity, CAD/Stents/CHF/Lasix (follows Dr. McAlgernon HuxleyASA only now which he has held prior), lap chole, neck surgery, elbow surgery. 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   Today " RoBarbaraann Rondois seen to proceed with restaging TURBT and retrogrades. NO interval fevers. Most recent UA without infectious parameters.   Past Medical History:  Diagnosis Date   Anxiety    takes Valium as needed   Aortic stenosis, moderate    Arthritis    Ascending aortic aneurysm (HCC)    CAD (coronary artery disease)    a. s/p PCI of the RCA 8/12 with DES by Dr CoBurt Knackpreserved EF. b. LHC/RHC (2/16) with mean RA 12, PA 32/15, mean PCWP 18, CI 3.47; patent mid and distal RCA stents, 50-60% proximal stenosis small PDA.      Carotid stenosis    a. Carotid [place]  Bilateral 1-39% ICA; L thyroid nodule (prior hx of aspiration).   Chronic diastolic CHF  (congestive heart failure) (HCC)    Depression    Essential hypertension    GERD (gastroesophageal reflux disease)    if needed will take OTC meds    Heart murmur    History of colonic polyps    hyperplastic   Hyperlipidemia    Joint pain    Lesion of bladder    [place]   Obesity (BMI 30-39.9) 02/29/2016   Restless leg    Sleep apnea    uses cpap   Tubular adenoma of colon    Vertigo    takes Meclizine as needed    Past Surgical History:  Procedure Laterality Date   CATARACT EXTRACTION   4 YRS AGO   BOTH EYES   CHOLECYSTECTOMY  07/21/2011   Procedure: LAPAROSCOPIC CHOLECYSTECTOMY WITH INTRAOPERATIVE CHOLANGIOGRAM;  Surgeon: DaShann MedalMD;  Location: [facility];  Service: General;  Laterality: N/A;   CORONARY ANGIOPLASTY  2012   2 stents   coronary stenting     s/p PCI of the RCA by Dr CoBurt Knack/12 with 2 promus stents   CYSTOSCOPY W/ RETROGRADES Bilateral 12/13/2019   Procedure: CYSTOSCOPY WITH RETROGRADE PYELOGRAM;  Surgeon: MaAlexis FrockMD;  [facility];  Laterality: Bilateral;   CYSTOSCOPY W/ RETROGRADES Bilateral 10/14/2020   Procedure: CYSTOSCOPY WITH RETROGRADE PYELOGRAM;  Surgeon: MaAlexis FrockMD;  Location:  [place];  Service: Urology;  Laterality: Bilateral;   LEFT AND RIGHT HEART CATHETERIZATION WITH CORONARY ANGIOGRAM N/A 06/23/2014   Procedure: LEFT AND RIGHT HEART CATHETERIZATION WITH CORONARY ANGIOGRAM;  Surgeon: Larey Dresser, MD;  Location: [facility] CATH LAB;  Service: Cardiovascular;  Laterality: N/A;   NECK SURGERY  03/23/09   per Dr. Lorin Mercy, cervical fusion    right elbow surgery     solonscopy  05/23/08   per Dr. Wynona Luna hemorrhoids only, repeat in 5 years   TEE WITHOUT CARDIOVERSION N/A 06/23/2014   Procedure: TRANSESOPHAGEAL ECHOCARDIOGRAM (TEE);  Surgeon: Larey Dresser, MD;  Location: [place];  Service: Cardiovascular;  Laterality: N/A;   TEE WITHOUT  CARDIOVERSION N/A 01/21/2016   Procedure: TRANSESOPHAGEAL ECHOCARDIOGRAM (TEE);  Surgeon: Larey Dresser, MD;  Location: [place];  Service: Cardiovascular;  Laterality: N/A;   TONSILLECTOMY     TRANSURETHRAL RESECTION OF BLADDER TUMOR N/A 10/21/2019   Procedure: TRANSURETHRAL RESECTION OF BLADDER TUMOR (TURBT);  Surgeon: Kathie Rhodes, MD;  Location: [facility];  Service: Urology;  Laterality: N/A;   TRANSURETHRAL RESECTION OF BLADDER TUMOR N/A 12/13/2019   Procedure: TRANSURETHRAL RESECTION OF BLADDER TUMOR (TURBT);  Surgeon: Alexis Frock, MD;  Location[facility];  Service: Urology;  Laterality: N/A;  1 HR   TRANSURETHRAL RESECTION OF BLADDER TUMOR N/A 10/14/2020   Procedure: TRANSURETHRAL RESECTION OF BLADDER TUMOR (TURBT);  Surgeon: Alexis Frock, MD;  Location: [facility];  Service: Urology;  Laterality: N/A;    Family History  Problem Relation Age of Onset   Lung cancer Mother        lung   Esophageal cancer Cousin    Colon cancer Neg Hx    Rectal cancer Neg Hx    Stomach cancer Neg Hx    Social History:  reports that he quit smoking about 10 years ago. His smoking use included cigarettes. He has a 80.00 pack-year smoking history. He has never used smokeless tobacco. He reports that he does not drink alcohol and does not use drugs.  Allergies:  Allergies  Allergen Reactions   Oxycodone     insomnia    No medications prior to admission.    No results found for this or any previous visit (from the past 48 hour(s)). No results found.  Review of Systems  Constitutional: Negative.   Genitourinary:  Positive for urgency.  All other systems reviewed and are negative.  There were no vitals taken for this visit. Physical Exam Vitals reviewed.  HENT:     Head: Normocephalic.     Nose: Nose normal.     Mouth/Throat:     Mouth: Mucous membranes are moist.  Eyes:     Pupils: Pupils are equal, round, and reactive to  light.  Cardiovascular:     Rate and Rhythm: Normal rate.  Abdominal:     Comments: Stable truncal obesity.   Genitourinary:    Comments: NO CVAT Musculoskeletal:        General: Normal range of motion.     Cervical back: Normal range of motion.  Skin:    General: Skin is warm.  Neurological:     General: No focal deficit present.     Mental Status: He is alert.     Assessment/Plan  Proceed as planned with cysto, retrogardes, TURBT (restaging). Risks, benefits, alternatives, expected peri-op course discsused previously and reiteratd today.   Alexis Frock, MD 12/16/2020, 8:17 AM

## 2020-12-16 NOTE — Anesthesia Preprocedure Evaluation (Signed)
Anesthesia Evaluation  Patient identified by MRN, date of birth, ID band Patient awake    Reviewed: Allergy & Precautions, NPO status , Patient's Chart, lab work & pertinent test results  Airway Mallampati: II  TM Distance: >3 FB     Dental   Pulmonary sleep apnea , former smoker,    breath sounds clear to auscultation       Cardiovascular hypertension, + angina + CAD, + Past MI and +CHF   Rhythm:Regular Rate:Normal     Neuro/Psych Anxiety Depression    GI/Hepatic Neg liver ROS, GERD  ,  Endo/Other  negative endocrine ROS  Renal/GU negative Renal ROS     Musculoskeletal  (+) Arthritis ,   Abdominal   Peds  Hematology   Anesthesia Other Findings   Reproductive/Obstetrics                             Anesthesia Physical Anesthesia Plan  ASA: 3  Anesthesia Plan: General   Post-op Pain Management:    Induction: Intravenous  PONV Risk Score and Plan: 3 and Ondansetron, Dexamethasone and Midazolam  Airway Management Planned: LMA  Additional Equipment:   Intra-op Plan:   Post-operative Plan:   Informed Consent: I have reviewed the patients History and Physical, chart, labs and discussed the procedure including the risks, benefits and alternatives for the proposed anesthesia with the patient or authorized representative who has indicated his/her understanding and acceptance.     Dental advisory given  Plan Discussed with: CRNA and Anesthesiologist  Anesthesia Plan Comments:         Anesthesia Quick Evaluation

## 2020-12-16 NOTE — Discharge Instructions (Addendum)
1 - You may have urinary urgency (bladder spasms) and bloody urine on / off for up to 2 weeks. This is normal.  2 - Call MD or go to ER for fever >102, severe pain / nausea / vomiting not relieved by medications, or acute change in medical status                                            Transurethral Procedure  Medications: Resume all your other meds from home  Activity: 1. No heavy lifting > 10 pounds for 2 weeks. 2. No sexual activity for 2 weeks. 3. No strenuous activity for 2 weeks. 4. No driving while on narcotic pain medications. 5. Drink plenty of water. 6. Continue to walk at home - you can still get blood clots when you are at home so keep     active but don't over do it. 7. Your urine may have some blood in it - make sure you drink plenty of water. Call or           come to the ER immediately if you catheter stops draining or you are unable to urinate.  Bathing: You can shower. You can take a bath unless you have a foley catheter in place.  Signs / Symptoms to call: 1. Call if you have a fever greater than 101.5  2. Uncontrolled nausea / vomiting, uncontrolled pain / dizziness, unable to urinate, leg         swelling / leg pain, or any other concerns.   You can reach [place] at [place] Instructions  Activity: Get plenty of rest for the remainder of the day. A responsible adult should stay with you for 24 hours following the procedure.  For the next 24 hours, DO NOT: -Drive a car -Paediatric nurse -Drink alcoholic beverages -Take any medication unless instructed by your physician -Make any legal decisions or sign important papers.  Meals: Start with liquid foods such as gelatin or soup. Progress to regular foods as tolerated. Avoid greasy, spicy, heavy foods. If nausea and/or vomiting occur, drink only clear liquids until the nausea and/or vomiting subsides. Call your physician if vomiting continues.  Special  Instructions/Symptoms: Your throat may feel dry or sore from the anesthesia or the breathing tube placed in your throat during surgery. If this causes discomfort, gargle with warm salt water. The discomfort should disappear within 24 hours.  If you had a scopolamine patch placed behind your ear for the management of post- operative nausea and/or vomiting:  1. The medication in the patch is effective for 72 hours, after which it should be removed.  Wrap patch in a tissue and discard in the trash. Wash hands thoroughly with soap and water. 2. You may remove the patch earlier than 72 hours if you experience unpleasant side effects which may include dry mouth, dizziness or visual disturbances. 3. Avoid touching the patch. Wash your hands with soap and water after contact with the patch.

## 2020-12-16 NOTE — Brief Op Note (Signed)
12/16/2020  10:42 AM  PATIENT:  [name]  65 y.o. male  PRE-OPERATIVE DIAGNOSIS:  BLADDER CANCER  POST-OPERATIVE DIAGNOSIS:  BLADDER CANCER  PROCEDURE:  Procedure(s): RESTAGING TRANSURETHRAL RESECTION OF BLADDER TUMOR (TURBT) (N/A) CYSTOSCOPY WITH RETROGRADE PYELOGRAM (Bilateral)  SURGEON:  Surgeon(s) and Role:    * Alexis Frock, MD - Primary  PHYSICIAN ASSISTANT:   ASSISTANTS: none   ANESTHESIA:   general  EBL:  minimal  BLOOD ADMINISTERED:none  DRAINS: none   LOCAL MEDICATIONS USED:  NONE  SPECIMEN:  Source of Specimen:  1 - old resection site; 2 - prostatic urethra  DISPOSITION OF SPECIMEN:  PATHOLOGY  COUNTS:  YES  TOURNIQUET:  * No tourniquets in log *  DICTATION: .Other Dictation: Dictation Number [id number]  PLAN OF CARE: Discharge to home after PACU  PATIENT DISPOSITION:  PACU - hemodynamically stable.   Delay start of Pharmacological VTE agent (>24hrs) due to surgical blood loss or risk of bleeding: yes

## 2020-12-16 NOTE — Anesthesia Procedure Notes (Signed)
Procedure Name: LMA Insertion Date/Time: 12/16/2020 10:14 AM Performed by: Genelle Bal, CRNA Pre-anesthesia Checklist: Patient identified, Emergency Drugs available, Suction available and Patient being monitored Patient Re-evaluated:Patient Re-evaluated prior to induction Oxygen Delivery Method: Circle system utilized Preoxygenation: Pre-oxygenation with 100% oxygen Induction Type: IV induction Ventilation: Mask ventilation without difficulty LMA: LMA inserted LMA Size: 5.0 Number of attempts: 1 Airway Equipment and Method: Bite block Placement Confirmation: positive ETCO2 Tube secured with: Tape Dental Injury: Teeth and Oropharynx as per pre-operative assessment

## 2020-12-17 ENCOUNTER — Encounter (HOSPITAL_BASED_OUTPATIENT_CLINIC_OR_DEPARTMENT_OTHER): Payer: Self-pay | Admitting: Urology

## 2020-12-17 NOTE — Op Note (Signed)
NAME: CAMMERON, BLICKENSTAFF MEDICAL RECORD NO: [id number] ACCOUNT NO: [id number] DATE OF BIRTH: 1955-10-15 FACILITY: [facility] LOCATION: [facility] PHYSICIAN: Alexis Frock, MD  Operative Report   DATE OF PROCEDURE: 12/16/2020  PREOPERATIVE DIAGNOSIS:  History of bladder cancer, recurrent.  PROCEDURES: 1.  Restaging transurethral resection of bladder tumor, volume small. 2.  Bilateral retrograde pyelogram with interpretation.  ESTIMATED BLOOD LOSS:  Nil.  COMPLICATIONS:  None.  SPECIMENS: 1.  Old resection site. 2.  Prostatic urethra.  FINDINGS:    1.  Unremarkable bilateral retrograde pyelograms. 2.  No evidence of gross resection at old resection site, left lateral bladder neck. 3.  Some questionable papillary tissue, prostatic urethra.  This was biopsied. 4.  Large obesity and high-riding prostate.  INDICATIONS:  The patient is a pleasant 65 year old man with a history of superficial bladder cancer spanning several years.  He has had a recurrence earlier this year, was managed with transurethral resection and found again to have non-muscle invasive,  but high-grade disease.  Options were discussed for further management including bladder preserving versus nonbladder preserving protocols, and he adamantly wished to proceed with a path of possible bladder preservation with repeat resection for  restaging.  He presents for this today.  Informed consent was obtained and placed in medical record.  DESCRIPTION OF PROCEDURE:  The patient being Treon Kiracofe verified.  Procedure being restaging transurethral resection of bladder tumor was confirmed. Procedure timeout was performed.  Intravenous antibiotics were administered.  General  LMA anesthesia was introduced.  The patient was placed into a low lithotomy position.  Sterile field was created prepping and draping the patient's penis, perineum, and proximal thigh using iodine.  Cystourethroscopy was performed using 21-French rigid   cystoscope with offset lens.  Inspection of the anterior and posterior urethra revealed a very large trilobar prostatic hypertrophy with high-riding bladder neck.  He did have some subtle papillary changes to the distal prostatic urethra worrisome for  possible urothelial carcinoma in this site.  Bladder was mildly trabeculated.  Ureteral orifices were singleton.  The old resection site lateral to the left ureteral orifice was unremarkable without any obvious recurrent tumor.  Attention was then  directed to the retrograde pyelograms.  The left ureteral orifice was cannulated with a 6-French renal catheter and left retrograde pyelogram was obtained.  Left retrograde pyelogram demonstrated a single left ureter, single system left kidney.  No filling defects or narrowing noted.  Similarly, right retrograde pyelogram was obtained.  Right retrograde pyelogram demonstrated a single right ureter, single system right kidney.  No filling defects or narrowing noted.  Given his unfavorable body habitus, angulation to the old resection site was difficult, but possible.  As such, it was  felt that cold cup biopsy forceps would be most prudent to sample this area, and cold cup biopsy forceps was used to obtain representative seromuscular samples of the old resection site.  These were set aside, labeled as such.  Again, this was close to,  but not involving the left ureteral orifice, and this remained visually patent following biopsy.  The base of this was fulgurated using loop electrode.  Again, hemostasis was excellent, and left orifice remained visually patent.  The prostatic urethra  was once again inspected.  Again, the area of subtle papillary tissue was of some concern.  This was sampled using a single swipe with the resectoscope at each prostatic lobe, which incorporated some of the papillary tissue, set aside and labeled as  prostatic urethral biopsy.  Base of  this was fulgurated.  Following this, there was  excellent hemostasis.  No evidence of any sort of perforation.  We achieved the goals of the surgery today.  The bladder was partially emptied per cystoscope.  Procedure  was terminated.  The patient tolerated the procedure well.  No immediate perioperative complications.  The patient was taken to postanesthesia care unit in stable condition.  Plan for discharge home after void.   [facility] D: 12/16/2020 10:47:45 am T: 12/16/2020 2:20:00 pm  JOB: [id number]

## 2020-12-18 LAB — SURGICAL PATHOLOGY

## 2020-12-24 DIAGNOSIS — C678 Malignant neoplasm of overlapping sites of bladder: Secondary | ICD-10-CM | POA: Diagnosis not present

## 2020-12-24 DIAGNOSIS — R338 Other retention of urine: Secondary | ICD-10-CM | POA: Diagnosis not present

## 2020-12-25 ENCOUNTER — Other Ambulatory Visit (HOSPITAL_COMMUNITY): Payer: Self-pay | Admitting: Cardiology

## 2021-01-02 ENCOUNTER — Other Ambulatory Visit: Payer: Self-pay | Admitting: Family Medicine

## 2021-01-03 ENCOUNTER — Other Ambulatory Visit: Payer: Self-pay | Admitting: Family Medicine

## 2021-01-04 NOTE — Progress Notes (Signed)
[facility] Progress Note   Patient ID: Luis [name], male   DOB: 1955-07-29, 65 y.o.   MRN: [id number] PCP: Dr. Sarajane Jews Cardiology: Dr. Aundra Dubin  65 y.o. with history of CAD and moderate aortic stenosis presents for follow of AS and CAD. He had RCA PCI in 8/12.     When I saw him in early 2016, he reported significant exertional dyspnea.  I had him do a Lexiscan Cardiolite in 2/16.  This showed no ischemia or infarction.  I also had him get an echo that showed ?bicuspid aortic valve and probably moderate aortic stenosis.  Given ongoing symptoms, I had him do a RHC/LHC and TEE in 2/16.  The RHC showed mildly elevated left and right heart filling pressures and the LHC showed nonobstructive CAD.  TEE confirmed bicuspid aortic valve with moderate AS.  He had PFTs in 4/16 that were within normal limits.  I started him on Lasix 20 mg daily.  He seemed to improve.    Progressive dypnea in fall 2017.  RHC/LHC in 2017 showed mildly elevated left heart filling pressure and nonobstructive CAD.  TEE showed moderate aortic stenosis, bicuspid valve.  Stable ascending aorta dilation.   Echo in 9/18 showed EF 60-65% with bicuspid aortic valve and moderate AS.  CTA chest showed 4.5 cm ascending aorta.  Repeat CTA chest in 8/19 showed stable 4.5 cm ascending aorta.  Echo in 8/20 showed EF 60-65%, moderate AS, 4.5 cm ascending aorta. CTA chest in 8/20 with 4.6 cm ascending aorta. Echo in 1/22 showed EF 60-65%, mild LVH, moderate AS with mean gradient 32 and AVA 1.6 cm^2, bicuspid aortic valve, ascending aorta 46 mm.   He is being treated for bladder cancer with chemotherapy and TURBT every 3 months.  Recent TURBT in 6/22 showed recurrence of bladder cancer, plan for intravesicle chemotherapy again.   At [facility] clinic f/u 7/22, he was ordered to get f/u CT. This showed stable dilatation of the ascending thoracic aorta with maximal measured diameter of approximately 4.4 cm. He was also ordered to start  low dose ? blocker, Coreg 6.25 bid. Diuretic also adjusted for mild fluid overload. Lasix was increased to 40 qam/20 qpm.  He returns now for 2 month f/u. Since his last visit, he underwent repeat TURBT 12/16/20. He reports he will have to restart chemo on 9/16. From a cardiac standpoint, he remains stable. Denies CP. Wt is down 2 lb. No significant dyspnea. Reports chronic exertional dyspnea w/ stairs and inclines but no dyspnea ambulating flat surfaces. EKG shows sinus brady, 51 bpm but asymptomatic. No fatigue. BP well controlled.     Labs (2/15): K 4.4, creatinine 0.9, LDL 71, HDL 53 Labs (2/16): K 4.8, creatinine 1.0, LDL 62, HDL 57 Labs (8/16): K 4.7, creatinine 0.95, TSH normal, HCT 41.2, LDL 78, HDL 58 Labs (8/17): K 4.3, creatinine 1.05, HCT 40.1, TSH normal, BNP 56 Labs (9/17): creatinine 1.08, BNP 56 Labs (7/18): creatinine 1.2, LDL 53 Labs (7/19): LDL 68 Labs (8/19): K 5, creatinine 1.09 Labs (8/20): K 4.9, creatinine 1.09, LDL 59, HDL 50 Labs (8/21): K 4.6, creatinine 1.0 Labs (1/22): K 4, creatinine 1.02, LDL 70 Labs (7/22): K 4.0, SCr 1.06   ECG (personally reviewed): sinus bradycardia, 51 bpm w/ 1st degree AVB   PMH: 1. OSA: Using CPAP.  2. CAD: Cardiolite 8/12 with inferior infarct and peri-infarct ischemia, had PCI to the mid and distal RCA with Promus DES x 2.  Lexiscan Cardiolite (2/16)  with no ischemia or infarction. LHC/RHC (2/16) with mean RA 12, PA 32/15, mean PCWP 18, CI 3.47; patent mid and distal RCA stents, 50-60% proximal stenosis small PDA.   - LHC/RHC (9/17): 60% proximal PDA; mean RA 2, PA 24/8, mean PCWP 18, CI 2.13.  - Cardiolite (8/17): EF 56%, normal.  3. Carotid stenosis: 2/15 carotid dopplers 1-39% bilateral ICA stenosis.  4. Colon polyps 5. HTN 6. Hyperlipidemia 7. Abdominal US (10/12) with no AAA 8. Aortic stenosis: Echo (2/15) with EF 60-65%, mild LVH, moderate diastolic dysfunction, normal RV size/systolic function, moderate aortic stenosis with  mean gradient 31 and AVA 1.16 cm2, ascending aorta 4.4 cm.  Echo (2/16) with EF 60-65%, grade II diastolic dysfunction, ?bicuspid aortic valve with moderate AS (mean gradient 35 mmHg, AVA 1.5 cm^2), mildly dilated RV with normal systolic function.  TEE (2/16) with bicuspid aortic valve, moderate AS mean gradient 30 mmHg, AVA > 1 cm^2, EF 55-60%, mild LVH, mildly dilated RV with normal systolic function, mild AI, mild MR, ascending aorta 4.2 cm.  - Echo (8/17): EF 60-65%, grade II diastolic dysfunction, bicuspid aortic valve with mean gradient 31 mmHg, probably moderate stenosis.  - TEE (9/17): EF 55-60%, bicuspid aortic valve with moderate AS with mean gradient 24 mmHg and AVA 1.05 cm^2, ascending aorta 4.2 cm.  - Echo (9/18): EF 60-65%, bicuspid aortic valve, AVA 1.05 cm^2 with mean gradient 24 mmHg (moderate AS).  - Echo (8/20): EF 60-65%, mild RV dilation with normal systolic function, moderate AS with AVA 1.46 cm^2 and mean gradient 36 mmHg, 4.5 cm ascending aorta.  - Echo (1/22): EF 60-65%, mild LVH, moderate AS with mean gradient 32 and AVA 1.6 cm^2, bicuspid aortic valve, ascending aorta 46 mm.  9. Ascending aorta aneurysm: 4.4 cm on 2/15 echo. Ascending aorta 4.2 cm TEE 2/16.  Ascending aorta 4.2 cm on 9/17 TEE.  - CTA chest (9/18): 4.5 cm ascending aorta.  - CTA chest (8/19): 4.5 cm ascending aorta - CTA chest (8/20): 4.6 cm ascending aorta 10. Chronic diastolic CHF.  11. PFTs (4/16) were within normal limits.  12. Lower extremity arterial dopplers normal in 8/17.  13. Bladder cancer: S/p chemo and TURBT  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  Sister with RyR2 gene.   ROS: All systems reviewed and negative except as per HPI.   Current Outpatient Medications  Medication Sig Dispense Refill   acetaminophen (TYLENOL) 325 MG tablet Take 650 mg by mouth every 6 (six) hours as needed.     aspirin 81 MG tablet Take 81 mg by mouth every morning.      carvedilol (COREG) 6.25 MG tablet Take 1 tablet (6.25 mg total) by mouth 2 (two) times daily. 180 tablet 3   diazepam (VALIUM) 5 MG tablet TAKE 1 TABLET (5 MG TOTAL) BY MOUTH EVERY 12 (TWELVE) HOURS AS NEEDED FOR ANXIETY. 60 tablet 5   furosemide (LASIX) 40 MG tablet TAKE 1 TABLET (40 MG TOTAL) BY MOUTH DAILY AND 1/2 TABLETS (20 MG TOTAL) EVERY EVENING. 45 tablet 1   ipratropium (ATROVENT) 0.03 % nasal spray Place 2 sprays into both nostrils every 12 (twelve) hours as needed for rhinitis.     losartan (COZAAR) 50 MG tablet Take 1 tablet (50 mg total) by mouth in the morning and at bedtime. 180 tablet 3   MAGNESIUM PO Take 500 mg by mouth daily.     meclizine (ANTIVERT) 25 MG tablet Take 1 tablet (  25 mg total) by mouth 3 (three) times daily as needed for dizziness. 30 tablet 0   nitroGLYCERIN (NITROSTAT) 0.4 MG SL tablet Place 1 tablet (0.4 mg total) under the tongue every 5 (five) minutes as needed. For chest pain. 25 tablet 2   rosuvastatin (CRESTOR) 20 MG tablet TAKE 1 TABLET BY MOUTH EVERYDAY AT BEDTIME 90 tablet 3   tamsulosin (FLOMAX) 0.4 MG CAPS capsule Take 0.4 mg by mouth daily.     traMADol (ULTRAM) 50 MG tablet Take 1-2 tablets (50-100 mg total) by mouth every 6 (six) hours as needed for moderate pain or severe pain (post-operatively). 15 tablet 0   venlafaxine XR (EFFEXOR-XR) 150 MG 24 hr capsule TAKE 1 CAPSULE BY MOUTH DAILY WITH BREAKFAST. 90 capsule 1   No current facility-administered medications for this encounter.   BP 134/78   Pulse (!) 51   Wt 124.6 kg (274 lb 12.8 oz)   SpO2 98%   BMI 43.69 kg/m  PHYSICAL EXAM: General:  Well appearing, moderately obese. No respiratory difficulty HEENT: normal Neck: supple. no JVD. Carotids 2+ bilat; no bruits. No lymphadenopathy or thyromegaly appreciated. Cor: PMI nondisplaced. Regular rhythm and slow rate . 2/6 AS murmur  Lungs: clear Abdomen: obese, soft, nontender, nondistended. No hepatosplenomegaly. No bruits or masses. Good  bowel sounds. Extremities: no cyanosis, clubbing, rash, edema Neuro: alert & oriented x 3, cranial nerves grossly intact. moves all 4 extremities w/o difficulty. Affect pleasant.   Assessment/Plan: 1. CAD: Prior PCI to RCA in 8/12.  LHC (9/17) with nonobstructive disease.  We had started him on Imdur for ?microvascular angina but he developed severe headaches without any improvement in symptoms.  Imdur discontinued. Has been stable w/o CP  - Continue ASA 81 and Crestor.  2. Aortic stenosis: Bicuspid valve with moderate AS by echo in 1/22.   - Repeat echo for progression in 1/23.     3. Hyperlipidemia: Good lipids 1/22. C/w statin.  4. Ascending aortic aneurysm: Associated with bicuspid aortic valve.  F/u Chest CT 8/22 showed stable stable dilatation of the ascending thoracic aorta with maximal measured diameter of approximately 4.4 cm - c/w ? blocker for HR and BP control - c/w statin - repeat CT annually  - 5. Chronic diastolic CHF:  NYHA class II Volume status stable   - Continue Lasix to 40 qam/20 qpm  - Hold off on SGLT2 inhibitor for now w/ active urological issues   - Check BMP today   6. OSA: Continue CPAP. Reports nightly compliance  7. HTN: Needs good BP control with ascending aortic aneurysm.  - Continue losartan 50 mg bid.  - Continue Coreg 6.25 mg bid. HF too slow for titration  8. Bladder Cancer: Recent TURBT in 6/22 showed recurrence of bladder cancer - plan for intravesicle chemotherapy again. Starts 9/16  9. Sinus Bradycardia: EKG shows SB 51 bpm w/ 1st degree AVB, in setting of ? blocker use. Asymptomatic - continue low dose ? blocker. Monitor. No indication for PPM.   Followup with Dr. Aundra Dubin in 3 months   Lyda Jester, PA-C  01/05/2021

## 2021-01-05 ENCOUNTER — Ambulatory Visit (HOSPITAL_COMMUNITY)
Admission: RE | Admit: 2021-01-05 | Discharge: 2021-01-05 | Disposition: A | Discharge status: Home / Self Care | Payer: PPO | Source: Ambulatory Visit | Attending: Cardiology | Admitting: Cardiology

## 2021-01-05 ENCOUNTER — Encounter (HOSPITAL_COMMUNITY): Payer: Self-pay

## 2021-01-05 ENCOUNTER — Other Ambulatory Visit: Payer: Self-pay

## 2021-01-05 VITALS — BP 134/78 | HR 51 | Wt 274.8 lb

## 2021-01-05 DIAGNOSIS — R001 Bradycardia, unspecified: Secondary | ICD-10-CM | POA: Insufficient documentation

## 2021-01-05 DIAGNOSIS — E785 Hyperlipidemia, unspecified: Secondary | ICD-10-CM | POA: Insufficient documentation

## 2021-01-05 DIAGNOSIS — Z79899 Other long term (current) drug therapy: Secondary | ICD-10-CM | POA: Diagnosis not present

## 2021-01-05 DIAGNOSIS — I712 Thoracic aortic aneurysm, without rupture: Secondary | ICD-10-CM | POA: Insufficient documentation

## 2021-01-05 DIAGNOSIS — I440 Atrioventricular block, first degree: Secondary | ICD-10-CM | POA: Insufficient documentation

## 2021-01-05 DIAGNOSIS — I110 Hypertensive heart disease with heart failure: Secondary | ICD-10-CM | POA: Insufficient documentation

## 2021-01-05 DIAGNOSIS — I2510 Atherosclerotic heart disease of native coronary artery without angina pectoris: Secondary | ICD-10-CM | POA: Diagnosis not present

## 2021-01-05 DIAGNOSIS — G4733 Obstructive sleep apnea (adult) (pediatric): Secondary | ICD-10-CM | POA: Diagnosis not present

## 2021-01-05 DIAGNOSIS — Z87891 Personal history of nicotine dependence: Secondary | ICD-10-CM | POA: Diagnosis not present

## 2021-01-05 DIAGNOSIS — I5032 Chronic diastolic (congestive) heart failure: Secondary | ICD-10-CM | POA: Diagnosis not present

## 2021-01-05 DIAGNOSIS — Z7982 Long term (current) use of aspirin: Secondary | ICD-10-CM | POA: Insufficient documentation

## 2021-01-05 DIAGNOSIS — Q231 Congenital insufficiency of aortic valve: Secondary | ICD-10-CM | POA: Insufficient documentation

## 2021-01-05 DIAGNOSIS — Z8249 Family history of ischemic heart disease and other diseases of the circulatory system: Secondary | ICD-10-CM | POA: Diagnosis not present

## 2021-01-05 DIAGNOSIS — Z8551 Personal history of malignant neoplasm of bladder: Secondary | ICD-10-CM | POA: Diagnosis not present

## 2021-01-05 DIAGNOSIS — Z955 Presence of coronary angioplasty implant and graft: Secondary | ICD-10-CM | POA: Insufficient documentation

## 2021-01-05 LAB — BASIC METABOLIC PANEL
Anion gap: 8 (ref 5–15)
BUN: 15 mg/dL (ref 8–23)
CO2: 27 mmol/L (ref 22–32)
Calcium: 8.7 mg/dL — ABNORMAL LOW (ref 8.9–10.3)
Chloride: 101 mmol/L (ref 98–111)
Creatinine, Ser: 0.94 mg/dL (ref 0.61–1.24)
GFR, Estimated: >60 mL/min (ref >60)
Glucose, Bld: 114 mg/dL — ABNORMAL HIGH (ref 70–99)
Potassium: 3.8 mmol/L (ref 3.5–5.1)
Sodium: 136 mmol/L (ref 135–145)

## 2021-01-05 LAB — BRAIN NATRIURETIC PEPTIDE: B Natriuretic Peptide: 18 pg/mL (ref 0.0–100.0)

## 2021-01-05 NOTE — Patient Instructions (Signed)
Do the following things EVERYDAY: Weigh yourself in the morning before breakfast. Write it down and keep it in a log. Take your medicines as prescribed Eat low salt foods--Limit salt (sodium) to 2000 mg per day.  Stay as active as you can everyday Limit all fluids for the day to less than 2 liters  Labs today We will only contact you if something comes back abnormal or we need to make some changes. Otherwise no news is good news!  Your physician recommends that you schedule a follow-up appointment in: [place] [facility]   Do the following things EVERYDAY: Weigh yourself in the morning before breakfast. Write it down and keep it in a log. Take your medicines as prescribed Eat low salt foods--Limit salt (sodium) to 2000 mg per day.  Stay as active as you can everyday Limit all fluids for the day to less than 2 liters  At the [facility], you and your health needs are our priority. As part of our continuing mission to provide you with exceptional heart care, we have created designated Provider Care Teams. These Care Teams include your primary Cardiologist (physician) and Advanced Practice Providers (APPs- Physician Assistants and Nurse Practitioners) who all work together to provide you with the care you need, when you need it.   You may see any of the following providers on your designated Care Team at your next follow up: Dr Glori Bickers Dr [name] Dr Patrice Paradise, NP Lyda Jester, [place] Ginnie Smart Audry Riles, PharmD   Please be sure to bring in all your medications bottles to every appointment.

## 2021-01-08 DIAGNOSIS — Z5111 Encounter for antineoplastic chemotherapy: Secondary | ICD-10-CM | POA: Diagnosis not present

## 2021-01-08 DIAGNOSIS — C678 Malignant neoplasm of overlapping sites of bladder: Secondary | ICD-10-CM | POA: Diagnosis not present

## 2021-01-14 ENCOUNTER — Telehealth: Payer: Self-pay | Admitting: Pharmacist

## 2021-01-14 NOTE — Chronic Care Management (AMB) (Signed)
Chronic Care Management Pharmacy Assistant   Name: Luis [name]  MRN: [id number] DOB: [birth year]  Reason for Encounter: Disease State/. Hypertension Assessment Call.   Conditions to be addressed/monitored: HTN   Recent office visits:  None.  Recent consult visits:  01/05/21 Lyda Jester PA-C (Heart and Vascular clinic) - seen for CHF. No medication changes. Follow up in 4 months.   11/04/20 [name] MD (Cardiology) - seen for chronic diastolic CHF and other chronic conditions. Patient started on carvedilol 6.25mg  twice daily. Increased furosemide from 40mg  daily to 40mg  daily and 20mg  every evening. Follow up in 10 days for lab work.  10/27/20 Alexis Frock (Urology) - seen for malignant neoplasm of overlapping sites of bladder and retention of urine. No medication changes or follow up needed.   Hospital visits:  None in previous 6 months  Medications: Outpatient Encounter Medications as of 01/14/2021  Medication Sig   acetaminophen (TYLENOL) 325 MG tablet Take 650 mg by mouth every 6 (six) hours as needed.   aspirin 81 MG tablet Take 81 mg by mouth every morning.   carvedilol (COREG) 6.25 MG tablet Take 1 tablet (6.25 mg total) by mouth 2 (two) times daily.   diazepam (VALIUM) 5 MG tablet TAKE 1 TABLET (5 MG TOTAL) BY MOUTH EVERY 12 (TWELVE) HOURS AS NEEDED FOR ANXIETY.   furosemide (LASIX) 40 MG tablet TAKE 1 TABLET (40 MG TOTAL) BY MOUTH DAILY AND 1/2 TABLETS (20 MG TOTAL) EVERY EVENING.   ipratropium (ATROVENT) 0.03 % nasal spray Place 2 sprays into both nostrils every 12 (twelve) hours as needed for rhinitis.   losartan (COZAAR) 50 MG tablet Take 1 tablet (50 mg total) by mouth in the morning and at bedtime.   MAGNESIUM PO Take 500 mg by mouth daily.   meclizine (ANTIVERT) 25 MG tablet Take 1 tablet (25 mg total) by mouth 3 (three) times daily as needed for dizziness.   nitroGLYCERIN (NITROSTAT) 0.4 MG SL tablet Place 1 tablet (0.4 mg total) under the tongue  every 5 (five) minutes as needed. For chest pain.   rosuvastatin (CRESTOR) 20 MG tablet TAKE 1 TABLET BY MOUTH EVERYDAY AT BEDTIME   tamsulosin (FLOMAX) 0.4 MG CAPS capsule Take 0.4 mg by mouth daily.   traMADol (ULTRAM) 50 MG tablet Take 1-2 tablets (50-100 mg total) by mouth every 6 (six) hours as needed for moderate pain or severe pain (post-operatively).   venlafaxine XR (EFFEXOR-XR) 150 MG 24 hr capsule TAKE 1 CAPSULE BY MOUTH DAILY WITH BREAKFAST.   No facility-administered encounter medications on file as of 01/14/2021.   Fill History: CARVEDILOL 6.25 MG TABLET 11/04/2020 90   FUROSEMIDE 40 MG TABLET 12/25/2020 30   LOSARTAN POTASSIUM 50 MG TAB 11/07/2020 90   ROSUVASTATIN CALCIUM 20 MG TAB 12/13/2020 90   TAMSULOSIN HCL 0.4 MG CAPSULE 12/31/2020 90   VENLAFAXINE HCL ER 150 MG CAP 01/04/2021 90   TRAMADOL HCL 50 MG TABLET 12/16/2020 3   Reviewed chart prior to disease state call. Spoke with patient regarding BP  Recent Office Vitals: BP Readings from Last 3 Encounters:  01/05/21 134/78  12/16/20 110/78  11/04/20 138/76   Pulse Readings from Last 3 Encounters:  01/05/21 (!) 51  12/16/20 (!) 52  11/04/20 60    Wt Readings from Last 3 Encounters:  01/05/21 274 lb 12.8 oz (124.6 kg)  12/16/20 274 lb 1.6 oz (124.3 kg)  11/04/20 276 lb (125.2 kg)     Kidney Function Lab Results  Component  Value Date/Time   CREATININE 0.94 01/05/2021 12:20 PM   CREATININE 0.90 12/16/2020 08:55 AM   CREATININE 1.08 01/14/2016 02:31 PM   CREATININE 1.05 11/27/2015 03:07 PM   GFR 85.71 11/26/2014 09:11 AM   GFRNONAA >60 01/05/2021 12:20 PM   GFRAA >60 08/10/2019 12:24 PM    BMP Latest Ref Rng & Units 01/05/2021 12/16/2020 11/23/2020  Glucose 70 - 99 mg/dL 114(H) 118(H) 133(H)  BUN 8 - 23 mg/dL 15 22 17   Creatinine 0.61 - 1.24 mg/dL 0.94 0.90 1.15  Sodium 135 - 145 mmol/L 136 140 137  Potassium 3.5 - 5.1 mmol/L 3.8 4.0 3.6  Chloride 98 - 111 mmol/L 101 103 104  CO2 22 - 32 mmol/L  27 - 26  Calcium 8.9 - 10.3 mg/dL 8.7(L) - 8.6(L)    Current antihypertensive regimen:  Losartan 50mg  - take 1 tablet by mouth in the morning and 1 tablet at bedtime. Carvedilol 6.25mg  - take 1 tablet by mouth twice daily.  How often are you checking your Blood Pressure? weekly Current home BP readings: 134/78 on 01/05/21. What recent interventions/DTPs have been made by any provider to improve Blood Pressure control since last CPP Visit: None. Any recent hospitalizations or ED visits since last visit with CPP? No  Adherence Review: Is the patient currently on ACE/ARB medication? Yes Does the patient have >5 day gap between last estimated fill dates? No  Notes: Spoke with patient and reviewed all medications as prescribed. Patient reports taking all medications and no issues at this time. Patient states he Panama what he wants. He does add a little salt here and there. He eats mostly chicken and fish with vegetables for dinner and a starch. He does drinks plenty of water and he tends to eat a late breakfast. Patient stated for activity he walks in his neighborhood and does yard work weekly. Patient thanked me for my call.  Care Gaps:  AWV - scheduled for 08/18/21 Hepatitis C screening - never done Tetanus/TDAP - never done Zoster vaccines  - never done Covid - 19 vaccine booster 4 - overdue since 05/09/20 Flu vaccine - due  Star Rating Drugs:  Losartan 50mg  - last filled on 11/07/20 90DS at [facility]  Rosuvastatin 20mg  - last filled on 12/13/20 90DS at [facility] Pharmacist Assistant [phone]

## 2021-01-15 DIAGNOSIS — C678 Malignant neoplasm of overlapping sites of bladder: Secondary | ICD-10-CM | POA: Diagnosis not present

## 2021-01-15 DIAGNOSIS — Z5111 Encounter for antineoplastic chemotherapy: Secondary | ICD-10-CM | POA: Diagnosis not present

## 2021-01-29 DIAGNOSIS — Z5111 Encounter for antineoplastic chemotherapy: Secondary | ICD-10-CM | POA: Diagnosis not present

## 2021-01-29 DIAGNOSIS — C678 Malignant neoplasm of overlapping sites of bladder: Secondary | ICD-10-CM | POA: Diagnosis not present

## 2021-02-04 ENCOUNTER — Encounter: Payer: Self-pay | Admitting: Family Medicine

## 2021-02-04 ENCOUNTER — Telehealth (INDEPENDENT_AMBULATORY_CARE_PROVIDER_SITE_OTHER): Payer: PPO | Admitting: Family Medicine

## 2021-02-04 VITALS — HR 69

## 2021-02-04 DIAGNOSIS — U071 COVID-19: Secondary | ICD-10-CM | POA: Diagnosis not present

## 2021-02-04 MED ORDER — MOLNUPIRAVIR EUA 200MG CAPSULE: 4.0000 | ORAL_CAPSULE | Freq: Two times a day (BID) | ORAL | 0 refills | Status: AC

## 2021-02-04 MED ORDER — BENZONATATE 200 MG PO CAPS: 200.0000 mg | ORAL_CAPSULE | Freq: Two times a day (BID) | ORAL | 0 refills | Status: DC | PRN

## 2021-02-04 NOTE — Progress Notes (Signed)
Virtual Visit via Video Note  I connected with Luis [name]  on 02/04/21 at  4:40 PM EDT by a video enabled telemedicine application and verified that I am speaking with the correct person using two identifiers.  Location patient: home, [place] Location provider:work or home office Persons participating in the virtual visit: patient, provider, wife  I discussed the limitations of evaluation and management by telemedicine and the availability of in person appointments. The patient expressed understanding and agreed to proceed.   HPI:  Acute telemedicine visit for Covid19: -Onset: 3 days ago, positive test yesterday -Symptoms include:sore throat, cough, headache, nasal congestion, chills -Denies: CP, SOB, NVD, inability to eat/drink/get out of bed -Pertinent past medical history: see below -Pertinent medication allergies:  Allergies  Allergen Reactions   Oxycodone     insomnia  -COVID-19 vaccine status: 2 doses and booster -labs in September if GFR > 60 ROS: See pertinent positives and negatives per HPI.  Past Medical History:  Diagnosis Date   Anxiety    takes Valium as needed   Aortic stenosis, moderate    Arthritis    Ascending aortic aneurysm    CAD (coronary artery disease)    a. s/p PCI of the RCA 8/12 with DES by Dr Burt Knack, preserved EF. b. LHC/RHC (2/16) with mean RA 12, PA 32/15, mean PCWP 18, CI 3.47; patent mid and distal RCA stents, 50-60% proximal stenosis small PDA.      Carotid stenosis    a. Carotid US (05/2013):  Bilateral 1-39% ICA; L thyroid nodule (prior hx of aspiration).   Chronic diastolic CHF (congestive heart failure) (HCC)    Depression    Essential hypertension    GERD (gastroesophageal reflux disease)    if needed will take OTC meds    Heart murmur    History of colonic polyps    hyperplastic   Hyperlipidemia    Joint pain    Lesion of bladder    Myocardial infarction ([place]) 2012   Obesity (BMI 30-39.9) 02/29/2016   Restless leg    Sleep apnea    uses  cpap   Tubular adenoma of colon    Vertigo    takes Meclizine as needed    Past Surgical History:  Procedure Laterality Date   CATARACT EXTRACTION   4 YRS AGO   BOTH EYES   CHOLECYSTECTOMY  07/21/2011   Procedure: LAPAROSCOPIC CHOLECYSTECTOMY WITH INTRAOPERATIVE CHOLANGIOGRAM;  Surgeon: Shann Medal, MD;  Location: [facility];  Service: General;  Laterality: N/A;   CORONARY ANGIOPLASTY  2012   2 stents   coronary stenting     s/p PCI of the RCA by Dr Burt Knack 8/12 with 2 promus stents   CYSTOSCOPY W/ RETROGRADES Bilateral 12/13/2019   Procedure: CYSTOSCOPY WITH RETROGRADE PYELOGRAM;  Surgeon: Alexis Frock, MD;  Location: [facility];  Service: Urology;  Laterality: Bilateral;   CYSTOSCOPY W/ RETROGRADES Bilateral 10/14/2020   Procedure: CYSTOSCOPY WITH RETROGRADE PYELOGRAM;  Surgeon: Alexis Frock, MD;  Location: [facility];  Service: Urology;  Laterality: Bilateral;   CYSTOSCOPY W/ RETROGRADES Bilateral 12/16/2020   Procedure: CYSTOSCOPY WITH RETROGRADE PYELOGRAM;  Surgeon: Alexis Frock, MD;  Location: [facility];  Service: Urology;  Laterality: Bilateral;   LEFT AND RIGHT HEART CATHETERIZATION WITH CORONARY ANGIOGRAM N/A 06/23/2014   Procedure: LEFT AND RIGHT HEART CATHETERIZATION WITH CORONARY ANGIOGRAM;  Surgeon: Larey Dresser, MD;  Location: [facility];  Service: Cardiovascular;  Laterality: N/A;   NECK SURGERY  03/23/09  per Dr. Lorin Mercy, cervical fusion    right elbow surgery     solonscopy  05/23/08   per Dr. Wynona Luna hemorrhoids only, repeat in 5 years   TEE WITHOUT CARDIOVERSION N/A 06/23/2014   Procedure: TRANSESOPHAGEAL ECHOCARDIOGRAM (TEE);  Surgeon: Larey Dresser, MD;  Location: [facility];  Service: Cardiovascular;  Laterality: N/A;   TEE WITHOUT CARDIOVERSION N/A 01/21/2016   Procedure: TRANSESOPHAGEAL ECHOCARDIOGRAM (TEE);  Surgeon: Larey Dresser, MD;  Location: [place];  Service: Cardiovascular;   Laterality: N/A;   TONSILLECTOMY     TRANSURETHRAL RESECTION OF BLADDER TUMOR N/A 10/21/2019   Procedure: TRANSURETHRAL RESECTION OF BLADDER TUMOR (TURBT);  Surgeon: Kathie Rhodes, MD;  Location: [facility];  Service: Urology;  Laterality: N/A;   TRANSURETHRAL RESECTION OF BLADDER TUMOR N/A 12/13/2019   Procedure: TRANSURETHRAL RESECTION OF BLADDER TUMOR (TURBT);  Surgeon: Alexis Frock, MD;  Location: [facility];  Service: Urology;  Laterality: N/A;  1 HR   TRANSURETHRAL RESECTION OF BLADDER TUMOR N/A 10/14/2020   Procedure: TRANSURETHRAL RESECTION OF BLADDER TUMOR (TURBT);  Surgeon: Alexis Frock, MD;  Location: [facility];  Service: Urology;  Laterality: N/A;   TRANSURETHRAL RESECTION OF BLADDER TUMOR N/A 12/16/2020   Procedure: RESTAGING TRANSURETHRAL RESECTION OF BLADDER TUMOR (TURBT);  Surgeon: Alexis Frock, MD;  Location: [facility];  Service: Urology;  Laterality: N/A;     Current Outpatient Medications:    acetaminophen (TYLENOL) 325 MG tablet, Take 650 mg by mouth every 6 (six) hours as needed., Disp: , Rfl:    aspirin 81 MG tablet, Take 81 mg by mouth every morning., Disp: , Rfl:    carvedilol (COREG) 6.25 MG tablet, Take 1 tablet (6.25 mg total) by mouth 2 (two) times daily., Disp: 180 tablet, Rfl: 3   diazepam (VALIUM) 5 MG tablet, TAKE 1 TABLET (5 MG TOTAL) BY MOUTH EVERY 12 (TWELVE) HOURS AS NEEDED FOR ANXIETY., Disp: 60 tablet, Rfl: 5   furosemide (LASIX) 40 MG tablet, TAKE 1 TABLET (40 MG TOTAL) BY MOUTH DAILY AND 1/2 TABLETS (20 MG TOTAL) EVERY EVENING., Disp: 45 tablet, Rfl: 1   ipratropium (ATROVENT) 0.03 % nasal spray, Place 2 sprays into both nostrils every 12 (twelve) hours as needed for rhinitis., Disp: , Rfl:    losartan (COZAAR) 50 MG tablet, Take 1 tablet (50 mg total) by mouth in the morning and at bedtime., Disp: 180 tablet, Rfl: 3   MAGNESIUM PO, Take 500 mg by mouth daily., Disp: , Rfl:     meclizine (ANTIVERT) 25 MG tablet, Take 1 tablet (25 mg total) by mouth 3 (three) times daily as needed for dizziness., Disp: 30 tablet, Rfl: 0   nitroGLYCERIN (NITROSTAT) 0.4 MG SL tablet, Place 1 tablet (0.4 mg total) under the tongue every 5 (five) minutes as needed. For chest pain., Disp: 25 tablet, Rfl: 2   rosuvastatin (CRESTOR) 20 MG tablet, TAKE 1 TABLET BY MOUTH EVERYDAY AT BEDTIME, Disp: 90 tablet, Rfl: 3   tamsulosin (FLOMAX) 0.4 MG CAPS capsule, Take 0.4 mg by mouth daily., Disp: , Rfl:    traMADol (ULTRAM) 50 MG tablet, Take 1-2 tablets (50-100 mg total) by mouth every 6 (six) hours as needed for moderate pain or severe pain (post-operatively)., Disp: 15 tablet, Rfl: 0   venlafaxine XR (EFFEXOR-XR) 150 MG 24 hr capsule, TAKE 1 CAPSULE BY MOUTH DAILY WITH BREAKFAST., Disp: 90 capsule, Rfl: 1  EXAM:  VITALS per patient if applicable:  GENERAL: alert, oriented, appears well and  in no acute distress  HEENT: atraumatic, conjunttiva clear, no obvious abnormalities on inspection of external nose and ears  NECK: normal movements of the head and neck  LUNGS: on inspection no signs of respiratory distress, breathing rate appears normal, no obvious gross SOB, gasping or wheezing  CV: no obvious cyanosis  MS: moves all visible extremities without noticeable abnormality  PSYCH/NEURO: pleasant and cooperative, no obvious depression or anxiety, speech and thought processing grossly intact  ASSESSMENT AND PLAN:  Discussed the following assessment and plan:  No diagnosis found.   Discussed treatment options (infusions and oral options and risk of drug interactions), ideal treatment window, potential complications, isolation and precautions for COVID-19.  Discussed possibility of rebound with antivirals and the need to reisolate if it should occur for 5 days. Checked for/reviewed any labs done in the last 90 days with GFR listed in HPI if available.  After lengthy discussion, the patient  opted for treatment with Le[place]rio due to being higher risk for complications of covid or severe disease and other factors. Discussed EUA status of this drug and the fact that there is preliminary limited knowledge of risks/interactions/side effects per EUA document vs possible benefits and precautions. This information was shared with patient during the visit and also was provided in patient instructions. Also, advised that patient discuss risks/interactions and use with pharmacist/treatment team as well. The patient did want a prescription for cough, Tessalon Rx sent.  Other symptomatic care measures summarized in patient instructions. Advised to seek prompt in person care if worsening, new symptoms arise, or if is not improving with treatment. Discussed options for inperson care if PCP office not available. Did let this patient know that I only do telemedicine on Tuesdays and Thursdays for [place]. Advised to schedule follow up visit with PCP or [facility] if any further questions or concerns to avoid delays in care.   I discussed the assessment and treatment plan with the patient. The patient was provided an opportunity to ask questions and all were answered. The patient agreed with the plan and demonstrated an understanding of the instructions.     Lucretia Kern, DO

## 2021-02-04 NOTE — Patient Instructions (Signed)
HOME CARE TIPS:   -I sent the medication(s) we discussed to your pharmacy: Meds ordered this encounter  Medications   molnupiravir EUA (LAGEVRIO) 200 mg CAPS capsule    Sig: Take 4 capsules (800 mg total) by mouth 2 (two) times daily for 5 days.    Dispense:  40 capsule    Refill:  0   benzonatate (TESSALON) 200 MG capsule    Sig: Take 1 capsule (200 mg total) by mouth 2 (two) times daily as needed.    Dispense:  20 capsule    Refill:  0     -I sent in the [facility] treatment or referral you requested per our discussion. Please see the information provided below and discuss further with the pharmacist/treatment team.   -there is a chance of rebound illness after finishing your treatment. If you become sick again please isolate for an additional 5 days, plus 5 more days of masking.    -can use nasal saline a few times per day if you have nasal congestion  -stay hydrated, drink plenty of fluids and eat small healthy meals - avoid dairy  -follow up with your doctor in 2-3 days unless improving and feeling better  -stay home while sick, except to seek medical care. If you have COVID19, ideally it would be best to stay home for a full 10 days since the onset of symptoms PLUS one day of no fever and feeling better. Wear a good mask that fits snugly (such as N95 or KN95) if around others to reduce the risk of transmission.  It was nice to meet you today, and I really hope you are feeling better soon. I help [place] out with telemedicine visits on Tuesdays and Thursdays and am available for visits on those days. If you have any concerns or questions following this visit please schedule a follow up visit with your Primary Care doctor or seek care at a local urgent care clinic to avoid delays in care.    Seek in person care or schedule a follow up video visit promptly if your symptoms worsen, new concerns arise or you are not improving with treatment. Call 911 and/or seek emergency care if  your symptoms are severe or life threatening.     Fact Sheet for Patients And Caregivers Emergency Use Authorization (EUA) Of LAGEVRIOT (molnupiravir) capsules For Coronavirus Disease 2019 (COVID-19)  What is the most important information I should know about LAGEVRIO? LAGEVRIO may cause serious side effects, including: ? LAGEVRIO may cause harm to your unborn baby. It is not known if LAGEVRIO will harm your baby if you take LAGEVRIO during pregnancy. o LAGEVRIO is not recommended for use in pregnancy. o LAGEVRIO has not been studied in pregnancy. LAGEVRIO was studied in pregnant animals only. When LAGEVRIO was given to pregnant animals, LAGEVRIO caused harm to their unborn babies. o You and your healthcare provider may decide that you should take LAGEVRIO during pregnancy if there are no other COVID-19 treatment options approved or authorized by the FDA that are accessible or clinically appropriate for you. o If you and your healthcare provider decide that you should take LAGEVRIO during pregnancy, you and your healthcare provider should discuss the known and potential benefits and the potential risks of taking LAGEVRIO during pregnancy. For individuals who are able to become pregnant: ? You should use a reliable method of birth control (contraception) consistently and correctly during treatment with LAGEVRIO and for 4 days after the last dose of LAGEVRIO. Talk to your  healthcare provider about reliable birth control methods. ? Before starting treatment with [facility] your healthcare provider may do a pregnancy test to see if you are pregnant before starting treatment with LAGEVRIO. ? Tell your healthcare provider right away if you become pregnant or think you may be pregnant during treatment with LAGEVRIO. Pregnancy Surveillance Program: ? There is a pregnancy surveillance program for individuals who take LAGEVRIO during pregnancy. The purpose of this program is to collect  information about the health of you and your baby. Talk to your healthcare provider about how to take part in this program. ? If you take LAGEVRIO during pregnancy and you agree to participate in the pregnancy surveillance program and allow your healthcare provider to share your information with [place], then your healthcare provider will report your use of [place] during pregnancy to [place]. by calling [phone] or PeacefulBlog.es. For individuals who are sexually active with partners who are able to become pregnant: ? It is not known if LAGEVRIO can affect sperm. While the risk is regarded as low, animal studies to fully assess the potential for LAGEVRIO to affect the babies of males treated with LAGEVRIO have not been completed. A reliable method of birth control (contraception) should be used consistently and correctly during treatment with LAGEVRIO and for at least 3 months after the last dose. The risk to sperm beyond 3 months is not known. Studies to understand the risk to sperm beyond 3 months are ongoing. Talk to your healthcare provider about reliable birth control methods. Talk to your healthcare provider if you have questions or concerns about how LAGEVRIO may affect sperm. You are being given this fact sheet because your healthcare provider believes it is necessary to provide you with LAGEVRIO for the treatment of adults with mild-to-moderate coronavirus disease 2019 (COVID-19) with positive results of direct SARS-CoV-2 viral testing, and who are at high risk for progression to severe COVID-19 including hospitalization or death, and for whom other COVID-19 treatment options approved or authorized by the FDA are not accessible or clinically appropriate. The U.S. Food and Drug Administration (FDA) has issued an Emergency Use Authorization (EUA) to make LAGEVRIO available during the COVID-19 pandemic (for more details about an EUA  please see "What is an Emergency Use Authorization?" at the end of this document). LAGEVRIO is not an FDA-approved medicine in the [place]. Read this Fact Sheet for information about LAGEVRIO. Talk to your healthcare provider about your options if you have any questions. It is your choice to take LAGEVRIO.  What is COVID-19? COVID-19 is caused by a virus called a coronavirus. You can get COVID-19 through close contact with another person who has the virus. COVID-19 illnesses have ranged from very mild-to-severe, including illness resulting in death. While information so far suggests that most COVID-19 illness is mild, serious illness can happen and may cause some of your other medical conditions to become worse. Older people and people of all ages with severe, long lasting (chronic) medical conditions like heart disease, lung disease and diabetes, for example seem to be at higher risk of being hospitalized for COVID-19.  What is LAGEVRIO? LAGEVRIO is an investigational medicine used to treat mild-to-moderate COVID-19 in adults: ? with positive results of direct SARS-CoV-2 viral testing, and ? who are at high risk for progression to severe COVID-19 including hospitalization or death, and for whom other COVID-19 treatment options approved or authorized by the FDA are not accessible or clinically appropriate. The FDA  has authorized the emergency use of [place] for the treatment of mild-tomoderate COVID-19 in adults under an EUA. For more information on EUA, see the "What is an Emergency Use Authorization (EUA)?" section at the end of this Fact Sheet. LAGEVRIO is not authorized: ? for use in people less than 8 years of age. ? for prevention of COVID-19. ? for people needing hospitalization for COVID-19. ? for use for longer than 5 consecutive days.  What should I tell my healthcare provider before I take LAGEVRIO? Tell your healthcare provider if you: ? Have any allergies ? Are  breastfeeding or plan to breastfeed ? Have any serious illnesses ? Are taking any medicines (prescription, over-the-counter, vitamins, or herbal products).  How do I take LAGEVRIO? ? Take LAGEVRIO exactly as your healthcare provider tells you to take it. ? Take 4 capsules of LAGEVRIO every 12 hours (for example, at 8 am and at 8 pm) ? Take LAGEVRIO for 5 days. It is important that you complete the full 5 days of treatment with LAGEVRIO. Do not stop taking LAGEVRIO before you complete the full 5 days of treatment, even if you feel better. ? Take LAGEVRIO with or without food. ? You should stay in isolation for as long as your healthcare provider tells you to. Talk to your healthcare provider if you are not sure about how to properly isolate while you have COVID-19. ? Swallow LAGEVRIO capsules whole. Do not open, break, or crush the capsules. If you cannot swallow capsules whole, tell your healthcare provider. ? What to do if you miss a dose: o If it has been less than 10 hours since the missed dose, take it as soon as you remember o If it has been more than 10 hours since the missed dose, skip the missed dose and take your dose at the next scheduled time. ? Do not double the dose of LAGEVRIO to make up for a missed dose.  What are the important possible side effects of LAGEVRIO? ? See, "What is the most important information I should know about LAGEVRIO?" ? Allergic Reactions. Allergic reactions can happen in people taking LAGEVRIO, even after only 1 dose. Stop taking LAGEVRIO and call your healthcare provider right away if you get any of the following symptoms of an allergic reaction: o hives o rapid heartbeat o trouble swallowing or breathing o swelling of the mouth, lips, or face o throat tightness o hoarseness o skin rash The most common side effects of LAGEVRIO are: ? diarrhea ? nausea ? dizziness These are not all the possible side effects of LAGEVRIO. Not many people have  taken LAGEVRIO. Serious and unexpected side effects may happen. This medicine is still being studied, so it is possible that all of the risks are not known at this time.  What other treatment choices are there?  Veklury (remdesivir) is FDA-approved as an intravenous (IV) infusion for the treatment of mildto-moderate XFGHW-29 in certain adults and children. Talk with your doctor to see if Marijean Heath is appropriate for you. Like LAGEVRIO, FDA may also allow for the emergency use of other medicines to treat people with COVID-19. Go to LacrosseProperties.si for more information. It is your choice to be treated or not to be treated with LAGEVRIO. Should you decide not to take it, it will not change your standard medical care.  What if I am breastfeeding? Breastfeeding is not recommended during treatment with LAGEVRIO and for 4 days after the last dose of LAGEVRIO. If you are breastfeeding  or plan to breastfeed, talk to your healthcare provider about your options and specific situation before taking LAGEVRIO.  How do I report side effects with LAGEVRIO? Contact your healthcare provider if you have any side effects that bother you or do not go away. Report side effects to FDA MedWatch at SmoothHits.hu or call 1-800-FDA-1088 (1- (662)[phone]).  How should I store [place]? ? Store LAGEVRIO capsules at room temperature between 75F to 61F (20C to 25C). ? Keep LAGEVRIO and all medicines out of the reach of children and pets. How can I learn more about COVID-19? ? Ask your healthcare provider. ? Visit SeekRooms.co.uk ? Contact your local or state public health department. ? Call [place] at [phone] (toll free in the [place] ? Visit www.molnupiravir.com  What Is an Emergency Use Authorization (EUA)? The [place] FDA has made [place] available under an emergency  access mechanism called an Emergency Use Authorization (EUA) The EUA is supported by a Presenter, broadcasting Health and Human Service (HHS) declaration that circumstances exist to justify emergency use of drugs and biological products during the COVID-19 pandemic. LAGEVRIO for the treatment of mild-to-moderate COVID-19 in adults with positive results of direct SARS-CoV-2 viral testing, who are at high risk for progression to severe COVID-19, including hospitalization or death, and for whom alternative COVID-19 treatment options approved or authorized by FDA are not accessible or clinically appropriate, has not undergone the same type of review as an FDA-approved product. In issuing an EUA under the PZPSU-86 public health emergency, the FDA has determined, among other things, that based on the total amount of scientific evidence available including data from adequate and well-controlled clinical trials, if available, it is reasonable to believe that the product may be effective for diagnosing, treating, or preventing COVID-19, or a serious or life-threatening disease or condition caused by COVID-19; that the known and potential benefits of the product, when used to diagnose, treat, or prevent such disease or condition, outweigh the known and potential risks of such product; and that there are no adequate, approved, and available alternatives.  All of these criteria must be met to allow for the product to be used in the treatment of patients during the COVID-19 pandemic. The EUA for LAGEVRIO is in effect for the duration of the COVID-19 declaration justifying emergency use of LAGEVRIO, unless terminated or revoked (after which LAGEVRIO may no longer be used under the EUA). For patent information: http://rogers.info/ Copyright  2021-2022 [place] [place], [place] and its affiliates. All rights reserved. [id number] Revised: March 2022

## 2021-02-09 DIAGNOSIS — G4733 Obstructive sleep apnea (adult) (pediatric): Secondary | ICD-10-CM | POA: Diagnosis not present

## 2021-02-11 DIAGNOSIS — G4733 Obstructive sleep apnea (adult) (pediatric): Secondary | ICD-10-CM | POA: Diagnosis not present

## 2021-02-11 DIAGNOSIS — F419 Anxiety disorder, unspecified: Secondary | ICD-10-CM | POA: Diagnosis not present

## 2021-02-12 DIAGNOSIS — C678 Malignant neoplasm of overlapping sites of bladder: Secondary | ICD-10-CM | POA: Diagnosis not present

## 2021-02-12 DIAGNOSIS — Z5111 Encounter for antineoplastic chemotherapy: Secondary | ICD-10-CM | POA: Diagnosis not present

## 2021-02-14 ENCOUNTER — Other Ambulatory Visit (HOSPITAL_COMMUNITY): Payer: Self-pay | Admitting: Cardiology

## 2021-02-15 DIAGNOSIS — F419 Anxiety disorder, unspecified: Secondary | ICD-10-CM | POA: Diagnosis not present

## 2021-02-15 DIAGNOSIS — G4733 Obstructive sleep apnea (adult) (pediatric): Secondary | ICD-10-CM | POA: Diagnosis not present

## 2021-02-19 DIAGNOSIS — Z5111 Encounter for antineoplastic chemotherapy: Secondary | ICD-10-CM | POA: Diagnosis not present

## 2021-02-19 DIAGNOSIS — C678 Malignant neoplasm of overlapping sites of bladder: Secondary | ICD-10-CM | POA: Diagnosis not present

## 2021-02-26 DIAGNOSIS — Z5111 Encounter for antineoplastic chemotherapy: Secondary | ICD-10-CM | POA: Diagnosis not present

## 2021-02-26 DIAGNOSIS — C678 Malignant neoplasm of overlapping sites of bladder: Secondary | ICD-10-CM | POA: Diagnosis not present

## 2021-03-08 ENCOUNTER — Other Ambulatory Visit (HOSPITAL_COMMUNITY): Payer: Self-pay | Admitting: Cardiology

## 2021-03-11 ENCOUNTER — Telehealth: Payer: Self-pay | Admitting: Pharmacist

## 2021-03-11 NOTE — Chronic Care Management (AMB) (Signed)
    Chronic Care Management Pharmacy Assistant   Name: Luis [name]  MRN: [id number] DOB: 1955-11-02  03/12/2021 APPOINTMENT REMINDER   Called Luis [name], No answer, left message of appointment on 03/12/2021 at 12:00 via telephone visit with [name], Pharm D. Notified to have all medications, supplements, blood pressure and/or blood sugar logs available during appointment and to return call if need to reschedule.  Care Gaps: AWV - scheduled for 08/18/21 Pneumovax - never done Hepatitis C screening - never done Tetanus/TDAP - never done Zoster vaccines  - never done Covid - 19 vaccine - overdue Flu vaccine - due Last BP - 134/78 on 01/05/2021  Star Rating Drug: Losartan 50mg  - last [facility]2022[facility]  Rosuvastatin 20mg  - last filled on 12/13/20 90DS [facility]  Any gaps in medications fill history? No  [place]  Catering manager [phone]

## 2021-03-12 ENCOUNTER — Ambulatory Visit (INDEPENDENT_AMBULATORY_CARE_PROVIDER_SITE_OTHER): Payer: PPO | Admitting: Pharmacist

## 2021-03-12 DIAGNOSIS — I10 Essential (primary) hypertension: Secondary | ICD-10-CM

## 2021-03-12 DIAGNOSIS — E785 Hyperlipidemia, unspecified: Secondary | ICD-10-CM

## 2021-03-12 NOTE — Progress Notes (Signed)
Chronic Care Management Pharmacy Note  03/24/2021 Name:  SOMA LIZAK MRN:  [id number] DOB:  November 21, 1955  Summary: BP is at goal < 130/80 per office readings   Recommendations/Changes made from today's visit: -Recommended for patient to bring BP cuff to next office visit to compare readings   Plan: Follow up in 6 months  Subjective: WILLET SCHLEIFER is an 65 y.o. year old male who is a primary patient of Laurey Morale, MD.  The CCM team was consulted for assistance with disease management and care coordination needs.    Engaged with patient by telephone for follow up visit in response to provider referral for pharmacy case management and/or care coordination services.   Consent to Services:  The patient was given information about Chronic Care Management services, agreed to services, and gave verbal consent prior to initiation of services.  Please see initial visit note for detailed documentation.   Patient Care Team: Laurey Morale, MD as PCP - General Sueanne Margarita, MD as PCP - Sleep Medicine (Cardiology) Thompson Grayer, MD as Consulting Physician (Cardiology) [name], [facility] as Pharmacist (Pharmacist)  Recent office visits: 02/04/21 Colin Benton, DO: Patient presented for video visit for COVID infection. Prescribed molnupiravir and benzonatate.   08/12/20 Charlott Rakes, LPN: Patient presented for AWV.  08/07/20 [name], MD: Patient presented for anxiety. D/c'd Celexa and started on Effexor XR 150 mg daily.  Recent consult visits: 01/15/21 Louis Meckel (urology): Patient presented for chemotherapy for bladder. Unable to access notes.  01/05/21 Brittainy Simmons PA-C (Heart and Vascular clinic) - seen for CHF. No medication changes. Follow up in 4 months.    11/04/20 [name] MD (Cardiology) - seen for chronic diastolic CHF and other chronic conditions. Patient started on carvedilol 6.46m twice daily. Increased furosemide from 43mdaily to 4037maily  and 60m21mery evening. Follow up in 10 days for lab work.   10/27/20 [facility]) - seen for malignant neoplasm of overlapping sites of bladder and retention of urine. No medication changes or follow up needed.     07/07/20 AdewSherrilyn Rist (pulmonary): Patient presented for OSA follow up. Arranged for home sleep study.   Hospital visits: 10/14/20 - Patient was admitted for transurethral resection of bladder tumor.  Objective:  Lab Results  Component Value Date   CREATININE 0.94 01/05/2021   BUN 15 01/05/2021   GFR 85.71 11/26/2014   GFRNONAA >60 01/05/2021   GFRAA >60 08/10/2019   NA 136 01/05/2021   K 3.8 01/05/2021   CALCIUM 8.7 (L) 01/05/2021   CO2 27 01/05/2021    Lab Results  Component Value Date/Time   GFR 85.71 11/26/2014 09:11 AM   GFR 73.22 07/21/2014 04:14 PM    Last diabetic Eye exam: No results found for: HMDIABEYEEXA  Last diabetic Foot exam: No results found for: HMDIABFOOTEX   Lab Results  Component Value Date   CHOL 146 11/04/2020   HDL 45 11/04/2020   LDLCALC 72 11/04/2020   LDLDIRECT 145.6 03/16/2007   TRIG 147 11/04/2020   CHOLHDL 3.2 11/04/2020    Hepatic Function Latest Ref Rng & Units 08/10/2019 11/26/2014 06/20/2014  Total Protein 6.5 - 8.1 g/dL 7.1 7.1 7.2  Albumin 3.5 - 5.0 g/dL 3.8 4.3 4.2  AST 15 - 41 U/L '22 19 16  ' ALT 0 - 44 U/L '30 21 21  ' Alk Phosphatase 38 - 126 U/L 70 82 81  Total Bilirubin 0.3 - 1.2 mg/dL 1.1 1.0 0.9  Bilirubin, Direct 0.0 - 0.3 mg/dL - 0.2 0.2    Lab Results  Component Value Date/Time   TSH 0.58 11/27/2015 03:07 PM   TSH 0.91 11/26/2014 09:11 AM   FREET4 0.79 01/10/2012 08:32 AM    CBC Latest Ref Rng & Units 12/16/2020 10/14/2020 04/28/2020  WBC 4.0 - 10.5 K/uL - - 6.8  Hemoglobin 13.0 - 17.0 g/dL 12.2(L) 12.9(L) 13.7  Hematocrit 39.0 - 52.0 % 36.0(L) 38.0(L) 42.4  Platelets 150 - 400 K/uL - - 142(L)    No results found for: VD25OH  Clinical ASCVD: Yes  The 10-year ASCVD risk score (Arnett DK,  et al., 2019) is: 16.1%   Values used to calculate the score:     Age: 2 years     Sex: Male     Is Non-Hispanic African American: No     Diabetic: No     Tobacco smoker: No     Systolic Blood Pressure: 428 mmHg     Is BP treated: Yes     HDL Cholesterol: 45 mg/dL     Total Cholesterol: 146 mg/dL    Depression screen [facility] 2/9 08/12/2020 06/16/2020  Decreased Interest 0 0  Down, Depressed, Hopeless 0 0  PHQ - 2 Score 0 0  Altered sleeping - 1  Tired, decreased energy - 3  Change in appetite - 0  Feeling bad or failure about yourself  - 0  Trouble concentrating - 0  Moving slowly or fidgety/restless - 0  Suicidal thoughts - 0  PHQ-9 Score - 4  Difficult doing work/chores - Somewhat difficult  Some recent data might be hidden     Social History   Tobacco Use  Smoking Status Former   Packs/day: 2.00   Years: 40.00   Pack years: 80.00   Types: Cigarettes   Quit date: 2012   Years since quitting: 10.9  Smokeless Tobacco Never   BP Readings from Last 3 Encounters:  01/05/21 134/78  12/16/20 110/78  11/04/20 138/76   Pulse Readings from Last 3 Encounters:  02/04/21 69  01/05/21 (!) 51  12/16/20 (!) 52   Wt Readings from Last 3 Encounters:  01/05/21 274 lb 12.8 oz (124.6 kg)  12/16/20 274 lb 1.6 oz (124.3 kg)  11/04/20 276 lb (125.2 kg)    Assessment/Interventions: Review of patient past medical history, allergies, medications, health status, including review of consultants reports, laboratory and other test data, was performed as part of comprehensive evaluation and provision of chronic care management services.   SDOH:  (Social Determinants of Health) assessments and interventions performed: No   CCM Care Plan  Allergies  Allergen Reactions   Oxycodone     insomnia    Medications Reviewed Today     Reviewed by Agnes Lawrence, CMA (Certified Medical Assistant) on 02/04/21 at 1522  Med List Status: <None>   Medication Order Taking? Sig Documenting  Provider Last Dose Status Informant  acetaminophen (TYLENOL) 325 MG tablet [id number] Yes Take 650 mg by mouth every 6 (six) hours as needed. [provider] Taking Active Self  aspirin 81 MG tablet [id number] Yes Take 81 mg by mouth every morning. [provider] Taking Active Self  carvedilol (COREG) 6.25 MG tablet [id number] Yes Take 1 tablet (6.25 mg total) by mouth 2 (two) times daily. Larey Dresser, MD Taking Active Self  diazepam (VALIUM) 5 MG tablet [id number] Yes TAKE 1 TABLET (5 MG TOTAL) BY MOUTH EVERY 12 (TWELVE) HOURS AS NEEDED FOR ANXIETY. Laurey Morale,  MD Taking Active Self  furosemide (LASIX) 40 MG tablet [id number] Yes TAKE 1 TABLET (40 MG TOTAL) BY MOUTH DAILY AND 1/2 TABLETS (20 MG TOTAL) EVERY EVENING. Larey Dresser, MD Taking Active   ipratropium (ATROVENT) 0.03 % nasal spray [id number] Yes Place 2 sprays into both nostrils every 12 (twelve) hours as needed for rhinitis. [provider] Taking Active Self  losartan (COZAAR) 50 MG tablet [id number] Yes Take 1 tablet (50 mg total) by mouth in the morning and at bedtime. Larey Dresser, MD Taking Active Self  MAGNESIUM PO [id number] Yes Take 500 mg by mouth daily. [provider] Taking Active Self  meclizine (ANTIVERT) 25 MG tablet [id number] Yes Take 1 tablet (25 mg total) by mouth 3 (three) times daily as needed for dizziness. Laurey Morale, MD Taking Active Self  nitroGLYCERIN (NITROSTAT) 0.4 MG SL tablet [id number] Yes Place 1 tablet (0.4 mg total) under the tongue every 5 (five) minutes as needed. For chest pain. Larey Dresser, MD Taking Active Self  rosuvastatin (CRESTOR) 20 MG tablet [id number] Yes TAKE 1 TABLET BY MOUTH EVERYDAY AT BEDTIME Larey Dresser, MD Taking Active Self  tamsulosin [facility]) 0.4 MG CAPS capsule [id number] Yes Take 0.4 mg by mouth daily. [provider] Taking Active   traMADol (ULTRAM) 50 MG tablet [id number] Yes Take 1-2 tablets (50-100 mg total) by mouth  every 6 (six) hours as needed for moderate pain or severe pain (post-operatively). Alexis Frock, MD Taking Active   venlafaxine XR (EFFEXOR-XR) 150 MG 24 hr capsule [id number] Yes TAKE 1 CAPSULE BY MOUTH DAILY WITH BREAKFAST. Laurey Morale, MD Taking Active             Patient Active Problem List   Diagnosis Date Noted   Morbid obesity ([place]) 02/29/2016   Essential hypertension    Ascending aortic aneurysm (HCC)    Rhinitis, chronic 01/14/2015   Generalized anxiety disorder 01/14/2015   Aortic stenosis, moderate 01/02/2015   Chronic diastolic CHF (congestive heart failure) ([place]) 06/30/2014   OSA (obstructive sleep apnea) 02/13/2012   Fatigue 01/09/2012   Gall bladder disease 06/16/2011   Hyperlipidemia 02/28/2011   Angina of effort ([place]) 12/08/2010   CAD (coronary artery disease) 12/08/2010   Tobacco abuse 12/08/2010   Aortic stenosis 12/08/2010   ANAL FISSURE 04/23/2009   INTERNAL HEMORRHOIDS 05/22/2008   COLONIC POLYPS 02/07/2008   CAROTID ARTERY STENOSIS 02/07/2008   Anxiety state 01/09/2007    Immunization History  Administered Date(s) Administered   Influenza Whole 03/23/2007   PFIZER(Purple Top)SARS-COV-2 Vaccination 06/30/2019, 07/30/2019, 01/08/2020   Patient just finished chemo 2 weeks ago and so far so good. He has felt his stress is manageable overall. He does have to do some repeat scans and is worried about those so hopefully his stress won't be affected much by the outcome of those.  Patient reports he is currently working on increasing his water intake.  Conditions to be addressed/monitored:  Hypertension, Hyperlipidemia, Heart Failure, Coronary Artery Disease, Anxiety and chronic rhinitis, vertigo, pain  Conditions addressed this visit: Hypertension, heart failure  Care Plan : CCM Pharmacy Care Plan  Updates made by [name], [place] since 03/24/2021 12:00 AM     Problem: Problem: Hypertension, Hyperlipidemia, Heart Failure, Coronary Artery  Disease, Anxiety and chronic rhinitis, vertigo, pain      Long-Range Goal: Patient-Specific Goal   Start Date: 06/11/2020  Expected End Date: 06/11/2021  Recent Progress: On track  Priority: High  Note:   Current Barriers:  Unable to independently monitor therapeutic efficacy  Pharmacist Clinical Goal(s):  Patient will achieve adherence to monitoring guidelines and medication adherence to achieve therapeutic efficacy achieve control of blood pressure as evidenced by home blood pressure monitoring  through collaboration with PharmD and provider.   Interventions: 1:1 collaboration with Laurey Morale, MD regarding development and update of comprehensive plan of care as evidenced by provider attestation and co-signature Inter-disciplinary care team collaboration (see longitudinal plan of care) Comprehensive medication review performed; medication list updated in electronic medical record  Hypertension (BP goal <130/80) -Controlled -Current treatment: losartan 8m, 1 tablet twice daily  Carvedilol 6.25 mg 1 tablet twice daily with a meal -Medications previously tried: none  -Current home readings: 137/80; 135-140 mmHg (checking every few weeks - arm cuff) -Current dietary habits: patient drinks 2 cups of coffee with caffeine in the morning -Current exercise habits: patient reports he enjoys walking but his knees bother him and he is still trying to walk with the grandkids when he can -Denies hypotensive/hypertensive symptoms -Educated on Exercise goal of 150 minutes per week; Importance of home blood pressure monitoring; Symptoms of hypotension and importance of maintaining adequate hydration; -Counseled to monitor BP at home weekly, document, and provide log at future appointments -Recommended to continue current medication  Hyperlipidemia/Coronary artery disease: (LDL goal < 70) -Controlled -Current treatment: rosuvastatin 269m 1 tablet once daily at bedtime  nitroglycerin  0.23m55mL tablet, 1 tablet under tongue every five minutes as needed for chest pain ASA 47m43m tablet daily -Medications previously tried: none  -Current dietary patterns: did not discuss -Current exercise habits: patient reports he enjoys walking but his knees bother him and he is still trying to walk when he can -Educated on Cholesterol goals;  Exercise goal of 150 minutes per week; -Recommended to continue current medication  Heart Failure (Goal: control symptoms and prevent exacerbations) -Controlled Type: Diastolic -NYHA Class: II (slight limitation of activity) -Ejection fraction: 60-65% (Date: 12/07/2018) -Current treatment: losartan 50mg45mtablet twice daily  furosemide 40mg,19mablet once daily and 20 mg in the evening Carvedilol 6.25 mg 1 tablet twice daily -Medications previously tried: none -Current home BP/HR readings: could not provide HR readings -Current dietary habits: did not discuss -Current exercise routine:  patient reports he enjoys walking but his knees bother him and he is still trying to walk when he can -Educated on Importance of weighing daily; if you gain more than 3 pounds in one day or 5 pounds in one week, contact cardiologist Importance of blood pressure control -Recommended to continue current medication.  Anxiety (Goal: minimize symptoms of anxiety) -Controlled -Current treatment: Venlafaxine 150 mg, 1 tablet once daily diazepam 5 mg, 1 tablet every twelve hours as needed for anxiety -Medications previously tried/failed: citalopram (no longer effective), escitalopram (cost) -GAD7: n/a -Educated on Benefits of medication for symptom control Benefits of cognitive-behavioral therapy with or without medication -Recommended to continue current medication  BPH (Goal: minimize symptoms of enlarged prostate) -Controlled -Current treatment  tamsulosin 0.4 mg 1 capsule daily -Medications previously tried: none  -Recommended to continue current  medication  Chronic rhinitis (Goal: minimize symptoms of chronic rhinitis) -Controlled -Current treatment  ipratropium 0.03% nasal spray (Atrovent), 2 sprays into both nostrils every 12 hours as needed (per patient report, has not needed to use it in awhile) -Medications previously tried: none  -Recommended to continue current medication  Pain (Goal: minimize symptoms of pain) -Controlled -Current treatment  ibuprofen 200mg, 47mblet two times daily as needed  for moderate pain -Medications previously tried: none  -Recommended using Tylenol over NSAIDs due to risk for increasing BP and fluid retention  Vertigo (Goal: minimize symptoms of vertigo) -Controlled -Current treatment  Meclizine 22m, 1 tablet three times daily as needed for dizziness -Medications previously tried: none  -Recommended to continue current medication   Health Maintenance -Vaccine gaps: shingles, tetanus, pneumonia -Current therapy:  None -Educated on Cost vs benefit of each product must be carefully weighed by individual consumer -Patient is satisfied with current therapy and denies issues  -Recommended to continue current medication  Patient Goals/Self-Care Activities Patient will:  - take medications as prescribed check blood pressure weekly, document, and provide at future appointments  Follow Up Plan: Telephone follow up appointment with care management team member scheduled for: 6 months       Medication Assistance: None required.  Patient affirms current coverage meets needs.   Compliance/Adherence/Medication fill history: Care Gaps: Hep C screening, tetanus, shingrix, pneumonia, COVID booster, influenza Last BP - 134/78 on 01/05/2021   Star-Rating Drugs: Losartan 520m- last filled on 02/06/2021 [facility]  Rosuvastatin 2020m last filled on 12/13/20 [facility] at [facility]  Patient's preferred pharmacy is:  [id number] [place] [id number]: [phone]: [phone] pill box? No - uses a rack Pt endorses 100% compliance  We discussed: Benefits of medication synchronization, packaging and delivery as well as enhanced pharmacist oversight with [employer]. Patient decided to: Continue current medication management strategy  Care Plan and Follow Up Patient Decision:  Patient agrees to Care Plan and Follow-up.  Plan: Telephone follow up appointment with care management team member scheduled for:  6 months    MadeJeni SallesarmD BCACCole Camprmacist [phone]

## 2021-03-18 DIAGNOSIS — G4733 Obstructive sleep apnea (adult) (pediatric): Secondary | ICD-10-CM | POA: Diagnosis not present

## 2021-03-18 DIAGNOSIS — F419 Anxiety disorder, unspecified: Secondary | ICD-10-CM | POA: Diagnosis not present

## 2021-03-24 DIAGNOSIS — Z87891 Personal history of nicotine dependence: Secondary | ICD-10-CM

## 2021-03-24 DIAGNOSIS — I5030 Unspecified diastolic (congestive) heart failure: Secondary | ICD-10-CM | POA: Diagnosis not present

## 2021-03-24 DIAGNOSIS — I110 Hypertensive heart disease with heart failure: Secondary | ICD-10-CM

## 2021-03-24 DIAGNOSIS — E785 Hyperlipidemia, unspecified: Secondary | ICD-10-CM | POA: Diagnosis not present

## 2021-03-24 NOTE — Patient Instructions (Signed)
Hi Luis [name],  It was great to get to catch up with you again!  Please reach out to me if you have any questions or need anything before our follow up!  Best, Maddie  [name], PharmD, [place]   Visit Information   Goals Addressed   None    Patient Care Plan: CCM Pharmacy Care Plan     Problem Identified: Problem: Hypertension, Hyperlipidemia, Heart Failure, Coronary Artery Disease, Anxiety and chronic rhinitis, vertigo, pain      Long-Range Goal: Patient-Specific Goal   Start Date: 06/11/2020  Expected End Date: 06/11/2021  Recent Progress: On track  Priority: High  Note:   Current Barriers:  Unable to independently monitor therapeutic efficacy Unable to achieve control of anxiety   Pharmacist Clinical Goal(s):  Patient will achieve adherence to monitoring guidelines and medication adherence to achieve therapeutic efficacy achieve control of blood pressure as evidenced by home blood pressure monitoring  through collaboration with PharmD and provider.   Interventions: 1:1 collaboration with Laurey Morale, MD regarding development and update of comprehensive plan of care as evidenced by provider attestation and co-signature Inter-disciplinary care team collaboration (see longitudinal plan of care) Comprehensive medication review performed; medication list updated in electronic medical record  Hypertension (BP goal <130/80) -Controlled -Current treatment: losartan 50mg , 1 tablet twice daily  Carvedilol 6.25 mg 1 tablet twice daily with a meal -Medications previously tried: none  -Current home readings: 137/80; 135-140 mmHg (checking every few weeks - arm cuff) -Current dietary habits: patient drinks 2 cups of coffee with caffeine in the morning -Current exercise habits: patient reports he enjoys walking but his knees bother him and he is still trying to walk with the grandkids when he can -Denies  hypotensive/hypertensive symptoms -Educated on Exercise goal of 150 minutes per week; Importance of home blood pressure monitoring; Symptoms of hypotension and importance of maintaining adequate hydration; -Counseled to monitor BP at home weekly, document, and provide log at future appointments -Recommended to continue current medication  Hyperlipidemia/Coronary artery disease: (LDL goal < 70) -Controlled -Current treatment: rosuvastatin 20mg , 1 tablet once daily at bedtime  nitroglycerin 0.4mg  SL tablet, 1 tablet under tongue every five minutes as needed for chest pain ASA 81mg , 1 tablet daily -Medications previously tried: none  -Current dietary patterns: did not discuss -Current exercise habits: patient reports he enjoys walking but his knees bother him and he is still trying to walk when he can -Educated on Cholesterol goals;  Exercise goal of 150 minutes per week; -Recommended to continue current medication  Heart Failure (Goal: control symptoms and prevent exacerbations) -Controlled Type: Diastolic -NYHA Class: II (slight limitation of activity) -Ejection fraction: 60-65% (Date: 12/07/2018) -Current treatment: losartan 50mg , 1 tablet twice daily  furosemide 40mg , 1 tablet once daily and 20 mg in the evening Carvedilol 6.25 mg 1 tablet twice daily -Medications previously tried: none -Current home BP/HR readings: could not provide HR readings -Current dietary habits: did not discuss -Current exercise routine:  patient reports he enjoys walking but his knees bother him and he is still trying to walk when he can -Educated on Importance of weighing daily; if you gain more than 3 pounds in one day or 5 pounds in one week, contact cardiologist Importance of blood pressure control -Recommended to continue current medication.  Anxiety (Goal: minimize symptoms of anxiety) -Controlled -Current treatment: Venlafaxine 150 mg, 1 tablet once daily diazepam 5 mg, 1 tablet every twelve  hours as needed for anxiety -  Medications previously tried/failed: citalopram (no longer effective), escitalopram (cost) -GAD7: n/a -Educated on Benefits of medication for symptom control Benefits of cognitive-behavioral therapy with or without medication -Recommended to continue current medication  BPH (Goal: minimize symptoms of enlarged prostate) -Controlled -Current treatment  tamsulosin 0.4 mg 1 capsule daily -Medications previously tried: none  -Recommended to continue current medication  Chronic rhinitis (Goal: minimize symptoms of chronic rhinitis) -Controlled -Current treatment  ipratropium 0.03% nasal spray (Atrovent), 2 sprays into both nostrils every 12 hours as needed (per patient report, has not needed to use it in awhile) -Medications previously tried: none  -Recommended to continue current medication  Pain (Goal: minimize symptoms of pain) -Controlled -Current treatment  ibuprofen 200mg , 1 tablet two times daily as needed for moderate pain -Medications previously tried: none  -Recommended using Tylenol over NSAIDs due to risk for increasing BP and fluid retention  Vertigo (Goal: minimize symptoms of vertigo) -Controlled -Current treatment  Meclizine 25mg , 1 tablet three times daily as needed for dizziness -Medications previously tried: none  -Recommended to continue current medication   Health Maintenance -Vaccine gaps: shingles, tetanus, pneumonia -Current therapy:  None -Educated on Cost vs benefit of each product must be carefully weighed by individual consumer -Patient is satisfied with current therapy and denies issues  -Recommended to continue current medication  Patient Goals/Self-Care Activities Patient will:  - take medications as prescribed check blood pressure weekly, document, and provide at future appointments  Follow Up Plan: Telephone follow up appointment with care management team member scheduled for: 6 months       Patient  verbalizes understanding of instructions provided today and agrees to view in [place].  Telephone follow up appointment with pharmacy team member scheduled for: 6 months  Luis [name], [facility]

## 2021-04-03 ENCOUNTER — Other Ambulatory Visit (HOSPITAL_COMMUNITY): Payer: Self-pay | Admitting: Internal Medicine

## 2021-04-17 DIAGNOSIS — F419 Anxiety disorder, unspecified: Secondary | ICD-10-CM | POA: Diagnosis not present

## 2021-04-17 DIAGNOSIS — G4733 Obstructive sleep apnea (adult) (pediatric): Secondary | ICD-10-CM | POA: Diagnosis not present

## 2021-05-02 ENCOUNTER — Other Ambulatory Visit (HOSPITAL_COMMUNITY): Payer: Self-pay | Admitting: Internal Medicine

## 2021-05-04 ENCOUNTER — Ambulatory Visit (HOSPITAL_COMMUNITY): Payer: PPO

## 2021-05-04 ENCOUNTER — Encounter (HOSPITAL_COMMUNITY): Payer: PPO | Admitting: Cardiology

## 2021-05-18 DIAGNOSIS — G4733 Obstructive sleep apnea (adult) (pediatric): Secondary | ICD-10-CM | POA: Diagnosis not present

## 2021-05-18 DIAGNOSIS — F419 Anxiety disorder, unspecified: Secondary | ICD-10-CM | POA: Diagnosis not present

## 2021-05-31 ENCOUNTER — Telehealth: Payer: Self-pay | Admitting: Pharmacist

## 2021-05-31 NOTE — Chronic Care Management (AMB) (Signed)
Chronic Care Management Pharmacy Assistant   Name: Luis [name]  MRN: [id number] DOB: 1955/05/28  Reason for Encounter: Disease State / Hypertension Assessment Call   Conditions to be addressed/monitored: HTN  Recent office visits:  None  Recent consult visits:  None  Hospital visits:  None  Medications: Outpatient Encounter Medications as of 05/31/2021  Medication Sig   acetaminophen (TYLENOL) 325 MG tablet Take 650 mg by mouth every 6 (six) hours as needed.   aspirin 81 MG tablet Take 81 mg by mouth every morning.   benzonatate (TESSALON) 200 MG capsule Take 1 capsule (200 mg total) by mouth 2 (two) times daily as needed.   carvedilol (COREG) 6.25 MG tablet Take 1 tablet (6.25 mg total) by mouth 2 (two) times daily.   diazepam (VALIUM) 5 MG tablet TAKE 1 TABLET (5 MG TOTAL) BY MOUTH EVERY 12 (TWELVE) HOURS AS NEEDED FOR ANXIETY.   furosemide (LASIX) 40 MG tablet TAKE 1 TABLET (40 MG TOTAL) BY MOUTH DAILY AND 1/2 TABLETS (20 MG TOTAL) EVERY EVENING.   ipratropium (ATROVENT) 0.03 % nasal spray Place 2 sprays into both nostrils every 12 (twelve) hours as needed for rhinitis.   losartan (COZAAR) 50 MG tablet TAKE 1 TABLET (50 MG TOTAL) BY MOUTH IN THE MORNING AND AT BEDTIME.   MAGNESIUM PO Take 500 mg by mouth daily.   meclizine (ANTIVERT) 25 MG tablet Take 1 tablet (25 mg total) by mouth 3 (three) times daily as needed for dizziness.   nitroGLYCERIN (NITROSTAT) 0.4 MG SL tablet Place 1 tablet (0.4 mg total) under the tongue every 5 (five) minutes as needed. For chest pain.   rosuvastatin (CRESTOR) 20 MG tablet TAKE 1 TABLET BY MOUTH EVERYDAY AT BEDTIME   tamsulosin (FLOMAX) 0.4 MG CAPS capsule Take 0.4 mg by mouth daily.   venlafaxine XR (EFFEXOR-XR) 150 MG 24 hr capsule TAKE 1 CAPSULE BY MOUTH DAILY WITH BREAKFAST.   No facility-administered encounter medications on file as of 05/31/2021.  Fill History: LOSARTAN POTASSIUM 50 MG TAB 04/15/2021 90   ROSUVASTATIN CALCIUM 20  MG TAB 03/13/2021 90   CARVEDILOL 6.25 MG TABLET 01/30/2021 90   CITALOPRAM HBR 40 MG TABLET 05/11/2020 90   FUROSEMIDE 40 MG TABLET 04/05/2021 30   TAMSULOSIN HCL 0.4 MG CAPSULE 04/04/2021 90   VENLAFAXINE HCL ER 150 MG CAP 04/04/2021 90   Reviewed chart prior to disease state call. Spoke with patient regarding BP  Recent Office Vitals: BP Readings from Last 3 Encounters:  01/05/21 134/78  12/16/20 110/78  11/04/20 138/76   Pulse Readings from Last 3 Encounters:  02/04/21 69  01/05/21 (!) 51  12/16/20 (!) 52    Wt Readings from Last 3 Encounters:  01/05/21 274 lb 12.8 oz (124.6 kg)  12/16/20 274 lb 1.6 oz (124.3 kg)  11/04/20 276 lb (125.2 kg)     Kidney Function Lab Results  Component Value Date/Time   CREATININE 0.94 01/05/2021 12:20 PM   CREATININE 0.90 12/16/2020 08:55 AM   CREATININE 1.08 01/14/2016 02:31 PM   CREATININE 1.05 11/27/2015 03:07 PM   GFR 85.71 11/26/2014 09:11 AM   GFRNONAA >60 01/05/2021 12:20 PM   GFRAA >60 08/10/2019 12:24 PM    BMP Latest Ref Rng & Units 01/05/2021 12/16/2020 11/23/2020  Glucose 70 - 99 mg/dL 114(H) 118(H) 133(H)  BUN 8 - 23 mg/dL 15 22 17   Creatinine 0.61 - 1.24 mg/dL 0.94 0.90 1.15  Sodium 135 - 145 mmol/L 136 140 137  Potassium 3.5 -  5.1 mmol/L 3.8 4.0 3.6  Chloride 98 - 111 mmol/L 101 103 104  CO2 22 - 32 mmol/L 27 - 26  Calcium 8.9 - 10.3 mg/dL 8.7(L) - 8.6(L)    Current antihypertensive regimen:  Carvedilol 6.25 mg twice daily Losartan 50 mg twice daily  How often are you checking your Blood Pressure?   Current home BP readings:   What recent interventions/DTPs have been made by any provider to improve Blood Pressure control since last CPP Visit: No recent interventions.  Any recent hospitalizations or ED visits since last visit with CPP? No recent hospital visits.  What diet changes have been made to improve Blood Pressure Control?  Timonium -   What exercise is being done to improve  your Blood Pressure Control?    Adherence Review: Is the patient currently on ACE/ARB medication? Yes Does the patient have >5 day gap between last estimated fill dates? No  Care Gaps: AWV - scheduled for 08/18/21 Last BP - 134/78 on 01/05/2021 Pneumovax - never done Hepatitis C screening - never done Tetanus/TDAP - never done Zoster vaccines  - never done Covid - 19 vaccine - overdue Flu vaccine - due  Star Rating Drugs: Losartan 50mg  - last filled on 04/15/2021 90DS at [facility]  Rosuvastatin 20mg  - last filled on 03/13/2021 90DS at [facility] [phone]

## 2021-06-02 DIAGNOSIS — G4733 Obstructive sleep apnea (adult) (pediatric): Secondary | ICD-10-CM | POA: Diagnosis not present

## 2021-06-15 ENCOUNTER — Other Ambulatory Visit (HOSPITAL_COMMUNITY): Payer: Self-pay | Admitting: Cardiology

## 2021-06-15 ENCOUNTER — Other Ambulatory Visit: Payer: Self-pay | Admitting: Family Medicine

## 2021-06-15 DIAGNOSIS — F411 Generalized anxiety disorder: Secondary | ICD-10-CM

## 2021-06-18 DIAGNOSIS — F419 Anxiety disorder, unspecified: Secondary | ICD-10-CM | POA: Diagnosis not present

## 2021-06-18 DIAGNOSIS — G4733 Obstructive sleep apnea (adult) (pediatric): Secondary | ICD-10-CM | POA: Diagnosis not present

## 2021-06-24 DIAGNOSIS — R338 Other retention of urine: Secondary | ICD-10-CM | POA: Diagnosis not present

## 2021-06-24 DIAGNOSIS — C678 Malignant neoplasm of overlapping sites of bladder: Secondary | ICD-10-CM | POA: Diagnosis not present

## 2021-07-01 ENCOUNTER — Telehealth: Payer: Self-pay | Admitting: Pharmacist

## 2021-07-01 ENCOUNTER — Ambulatory Visit (HOSPITAL_COMMUNITY)
Admission: RE | Admit: 2021-07-01 | Discharge: 2021-07-01 | Disposition: A | Discharge status: Home / Self Care | Payer: PPO | Source: Ambulatory Visit | Attending: Cardiology | Admitting: Cardiology

## 2021-07-01 ENCOUNTER — Other Ambulatory Visit (HOSPITAL_COMMUNITY): Payer: Self-pay

## 2021-07-01 ENCOUNTER — Other Ambulatory Visit: Payer: Self-pay

## 2021-07-01 ENCOUNTER — Ambulatory Visit (HOSPITAL_BASED_OUTPATIENT_CLINIC_OR_DEPARTMENT_OTHER)
Admission: RE | Admit: 2021-07-01 | Discharge: 2021-07-01 | Disposition: A | Discharge status: Home / Self Care | Payer: PPO | Source: Ambulatory Visit | Attending: Cardiology | Admitting: Cardiology

## 2021-07-01 ENCOUNTER — Encounter (HOSPITAL_COMMUNITY): Payer: Self-pay | Admitting: Cardiology

## 2021-07-01 VITALS — BP 108/60 | HR 52 | Wt 281.4 lb

## 2021-07-01 DIAGNOSIS — Z7982 Long term (current) use of aspirin: Secondary | ICD-10-CM | POA: Diagnosis not present

## 2021-07-01 DIAGNOSIS — I2510 Atherosclerotic heart disease of native coronary artery without angina pectoris: Secondary | ICD-10-CM | POA: Insufficient documentation

## 2021-07-01 DIAGNOSIS — E785 Hyperlipidemia, unspecified: Secondary | ICD-10-CM | POA: Insufficient documentation

## 2021-07-01 DIAGNOSIS — I5032 Chronic diastolic (congestive) heart failure: Secondary | ICD-10-CM

## 2021-07-01 DIAGNOSIS — Q230 Congenital stenosis of aortic valve: Secondary | ICD-10-CM | POA: Insufficient documentation

## 2021-07-01 DIAGNOSIS — I7121 Aneurysm of the ascending aorta, without rupture: Secondary | ICD-10-CM | POA: Diagnosis not present

## 2021-07-01 DIAGNOSIS — I110 Hypertensive heart disease with heart failure: Secondary | ICD-10-CM | POA: Insufficient documentation

## 2021-07-01 DIAGNOSIS — R001 Bradycardia, unspecified: Secondary | ICD-10-CM | POA: Diagnosis not present

## 2021-07-01 DIAGNOSIS — Z9989 Dependence on other enabling machines and devices: Secondary | ICD-10-CM | POA: Diagnosis not present

## 2021-07-01 DIAGNOSIS — I700 Atherosclerosis of aorta: Secondary | ICD-10-CM | POA: Insufficient documentation

## 2021-07-01 DIAGNOSIS — I350 Nonrheumatic aortic (valve) stenosis: Secondary | ICD-10-CM | POA: Diagnosis not present

## 2021-07-01 DIAGNOSIS — Z955 Presence of coronary angioplasty implant and graft: Secondary | ICD-10-CM | POA: Insufficient documentation

## 2021-07-01 DIAGNOSIS — Z9221 Personal history of antineoplastic chemotherapy: Secondary | ICD-10-CM | POA: Diagnosis not present

## 2021-07-01 DIAGNOSIS — G4733 Obstructive sleep apnea (adult) (pediatric): Secondary | ICD-10-CM | POA: Diagnosis not present

## 2021-07-01 DIAGNOSIS — Z8551 Personal history of malignant neoplasm of bladder: Secondary | ICD-10-CM | POA: Insufficient documentation

## 2021-07-01 DIAGNOSIS — Z79899 Other long term (current) drug therapy: Secondary | ICD-10-CM | POA: Insufficient documentation

## 2021-07-01 DIAGNOSIS — R0609 Other forms of dyspnea: Secondary | ICD-10-CM | POA: Diagnosis not present

## 2021-07-01 DIAGNOSIS — M25569 Pain in unspecified knee: Secondary | ICD-10-CM | POA: Insufficient documentation

## 2021-07-01 LAB — ECHOCARDIOGRAM COMPLETE
AR max vel: 1.03 cm2
AV Area VTI: 0.91 cm2
AV Area mean vel: 0.89 cm2
AV Mean grad: 48 mmHg
AV Peak grad: 75.6 mmHg
Ao pk vel: 4.35 m/s
Area-P 1/2: 2.5 cm2
S' Lateral: 2.3 cm

## 2021-07-01 LAB — LIPID PANEL
Cholesterol: 143 mg/dL (ref 0–200)
HDL: 48 mg/dL (ref >40)
LDL Cholesterol: 61 mg/dL (ref 0–99)
Total CHOL/HDL Ratio: 3 RATIO
Triglycerides: 169 mg/dL — ABNORMAL HIGH (ref <150)
VLDL: 34 mg/dL (ref 0–40)

## 2021-07-01 LAB — BASIC METABOLIC PANEL
Anion gap: 9 (ref 5–15)
BUN: 14 mg/dL (ref 8–23)
CO2: 26 mmol/L (ref 22–32)
Calcium: 8.6 mg/dL — ABNORMAL LOW (ref 8.9–10.3)
Chloride: 104 mmol/L (ref 98–111)
Creatinine, Ser: 0.96 mg/dL (ref 0.61–1.24)
GFR, Estimated: >60 mL/min (ref >60)
Glucose, Bld: 113 mg/dL — ABNORMAL HIGH (ref 70–99)
Potassium: 4 mmol/L (ref 3.5–5.1)
Sodium: 139 mmol/L (ref 135–145)

## 2021-07-01 LAB — CBC
HCT: 39.4 % (ref 39.0–52.0)
Hemoglobin: 13.2 g/dL (ref 13.0–17.0)
MCH: 30 pg (ref 26.0–34.0)
MCHC: 33.5 g/dL (ref 30.0–36.0)
MCV: 89.5 fL (ref 80.0–100.0)
Platelets: 160 10*3/uL (ref 150–400)
RBC: 4.4 MIL/uL (ref 4.22–5.81)
RDW: 13.9 % (ref 11.5–15.5)
WBC: 5.9 10*3/uL (ref 4.0–10.5)
nRBC: 0 % (ref 0.0–0.2)

## 2021-07-01 NOTE — Chronic Care Management (AMB) (Signed)
? ? ?  Chronic Care Management ?Pharmacy Assistant  ? ?Name: Luis [name]  MRN: [id number] DOB: [birth year] ? ?Reason for Encounter: Reschedule appointment to July ?Unable to reach patient after several attempts.  ?  ? ?Gennie Alma CMA  ?Clinical Pharmacist Assistant ?[phone] ? ?

## 2021-07-01 NOTE — Progress Notes (Signed)
Patient ID: Luis [name], male   DOB: July 12, 1955, 66 y.o.   MRN: [id number] PCP: Dr. Sarajane Jews Cardiology: Dr. Aundra Dubin  66 y.o. with history of CAD and moderate aortic stenosis presents for follow of AS and CAD. He had RCA PCI in 8/12.     When I saw him in early 2016, he reported significant exertional dyspnea.  I had him do a Lexiscan Cardiolite in 2/16.  This showed no ischemia or infarction.  I also had him get an echo that showed ?bicuspid aortic valve and probably moderate aortic stenosis.  Given ongoing symptoms, I had him do a RHC/LHC and TEE in 2/16.  The RHC showed mildly elevated left and right heart filling pressures and the LHC showed nonobstructive CAD.  TEE confirmed bicuspid aortic valve with moderate AS.  He had PFTs in 4/16 that were within normal limits.  I started him on Lasix 20 mg daily.  He seemed to improve.    Progressive dypnea in fall 2017.  RHC/LHC in 2017 showed mildly elevated left heart filling pressure and nonobstructive CAD.  TEE showed moderate aortic stenosis, bicuspid valve.  Stable ascending aorta dilation.   Echo in 9/18 showed EF 60-65% with bicuspid aortic valve and moderate AS.  CTA chest showed 4.5 cm ascending aorta.  Repeat CTA chest in 8/19 showed stable 4.5 cm ascending aorta.  Echo in 8/20 showed EF 60-65%, moderate AS, 4.5 cm ascending aorta. CTA chest in 8/20 with 4.6 cm ascending aorta. Echo in 1/22 showed EF 60-65%, mild LVH, moderate AS with mean gradient 32 and AVA 1.6 cm^2, bicuspid aortic valve, ascending aorta 46 mm.   He is being treated for bladder cancer with chemotherapy and TURBT.   CTA chest in 8/22 with 4.4 cm ascending aorta.   Echo was done today and reviewed, EF 60-65% with mild LVH, normal RV, bicuspid aortic valve with severe AS and mean gradient 48 mmHg, AVA 0.91 cm^2.    Weight is up 7 lbs since last appointment.  Patient reports feeling "tired all the time."  He is primarily limited by his knee pain when walking but notes dyspnea  walking up stairs and with yardwork.  No chest pain.  No orthopnea/PND. No lightheadedness.    ECG (personally reviewed): NSR, LAFB  Labs (2/15): K 4.4, creatinine 0.9, LDL 71, HDL 53 Labs (2/16): K 4.8, creatinine 1.0, LDL 62, HDL 57 Labs (8/16): K 4.7, creatinine 0.95, TSH normal, HCT 41.2, LDL 78, HDL 58 Labs (8/17): K 4.3, creatinine 1.05, HCT 40.1, TSH normal, BNP 56 Labs (9/17): creatinine 1.08, BNP 56 Labs (7/18): creatinine 1.2, LDL 53 Labs (7/19): LDL 68 Labs (8/19): K 5, creatinine 1.09 Labs (8/20): K 4.9, creatinine 1.09, LDL 59, HDL 50 Labs (8/21): K 4.6, creatinine 1.0 Labs (1/22): K 4, creatinine 1.02, LDL 70 Labs (9/22): BNP 18, K 3.8, creatinine 0.94  PMH: 1. OSA: Using CPAP.  2. CAD: Cardiolite 8/12 with inferior infarct and peri-infarct ischemia, had PCI to the mid and distal RCA with Promus DES x 2.  Lexiscan Cardiolite (2/16) with no ischemia or infarction. LHC/RHC (2/16) with mean RA 12, PA 32/15, mean PCWP 18, CI 3.47; patent mid and distal RCA stents, 50-60% proximal stenosis small PDA.   - LHC/RHC (9/17): 60% proximal PDA; mean RA 2, PA 24/8, mean PCWP 18, CI 2.13.  - Cardiolite (8/17): EF 56%, normal.  3. Carotid stenosis: 2/15 carotid dopplers 1-39% bilateral ICA stenosis.  4. Colon polyps 5. HTN 6. Hyperlipidemia 7. Abdominal  US (10/12) with no AAA 8. Aortic stenosis: Echo (2/15) with EF 60-65%, mild LVH, moderate diastolic dysfunction, normal RV size/systolic function, moderate aortic stenosis with mean gradient 31 and AVA 1.16 cm2, ascending aorta 4.4 cm.  Echo (2/16) with EF 60-65%, grade II diastolic dysfunction, ?bicuspid aortic valve with moderate AS (mean gradient 35 mmHg, AVA 1.5 cm^2), mildly dilated RV with normal systolic function.  TEE (2/16) with bicuspid aortic valve, moderate AS mean gradient 30 mmHg, AVA > 1 cm^2, EF 55-60%, mild LVH, mildly dilated RV with normal systolic function, mild AI, mild MR, ascending aorta 4.2 cm.  - Echo (8/17): EF  60-65%, grade II diastolic dysfunction, bicuspid aortic valve with mean gradient 31 mmHg, probably moderate stenosis.  - TEE (9/17): EF 55-60%, bicuspid aortic valve with moderate AS with mean gradient 24 mmHg and AVA 1.05 cm^2, ascending aorta 4.2 cm.  - Echo (9/18): EF 60-65%, bicuspid aortic valve, AVA 1.05 cm^2 with mean gradient 24 mmHg (moderate AS).  - Echo (8/20): EF 60-65%, mild RV dilation with normal systolic function, moderate AS with AVA 1.46 cm^2 and mean gradient 36 mmHg, 4.5 cm ascending aorta.  - Echo (1/22): EF 60-65%, mild LVH, moderate AS with mean gradient 32 and AVA 1.6 cm^2, bicuspid aortic valve, ascending aorta 46 mm.  - Echo (3/23): EF 60-65% with mild LVH, normal RV, bicuspid aortic valve with severe AS and mean gradient 48 mmHg, AVA 0.91 cm^2. 4.5 cm ascending aorta.  9. Ascending aorta aneurysm: 4.4 cm on 2/15 echo. Ascending aorta 4.2 cm TEE 2/16.  Ascending aorta 4.2 cm on 9/17 TEE.  - CTA chest (9/18): 4.5 cm ascending aorta.  - CTA chest (8/19): 4.5 cm ascending aorta - CTA chest (8/20): 4.6 cm ascending aorta - CTA chest (8/22): 4.4 cm ascending aorta 10. Chronic diastolic CHF.  11. PFTs (4/16) were within normal limits.  12. Lower extremity arterial dopplers normal in 8/17.  13. Bladder cancer: S/p chemo and TURBT  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  Sister with RyR2 gene.   ROS: All systems reviewed and negative except as per HPI.   Current Outpatient Medications  Medication Sig Dispense Refill   acetaminophen (TYLENOL) 325 MG tablet Take 650 mg by mouth every 6 (six) hours as needed.     aspirin 81 MG tablet Take 81 mg by mouth every morning.     carvedilol (COREG) 6.25 MG tablet Take 1 tablet (6.25 mg total) by mouth 2 (two) times daily. 180 tablet 3   diazepam (VALIUM) 5 MG tablet TAKE 1 TABLET (5 MG TOTAL) BY MOUTH EVERY 12 (TWELVE) HOURS AS NEEDED FOR ANXIETY. 60 tablet 5   furosemide (LASIX) 40 MG  tablet TAKE 1 TABLET (40 MG TOTAL) BY MOUTH DAILY AND 1/2 TABLETS (20 MG TOTAL) EVERY EVENING. 45 tablet 1   ipratropium (ATROVENT) 0.03 % nasal spray Place 2 sprays into both nostrils every 12 (twelve) hours as needed for rhinitis.     losartan (COZAAR) 50 MG tablet TAKE 1 TABLET (50 MG TOTAL) BY MOUTH IN THE MORNING AND AT BEDTIME. 180 tablet 3   MAGNESIUM PO Take 500 mg by mouth daily.     meclizine (ANTIVERT) 25 MG tablet Take 1 tablet (25 mg total) by mouth 3 (three) times daily as needed for dizziness. 30 tablet 0   nitroGLYCERIN (NITROSTAT) 0.4 MG SL tablet Place 1 tablet (0.4 mg total) under the tongue every 5 (five) minutes as  needed. For chest pain. 25 tablet 2   rosuvastatin (CRESTOR) 20 MG tablet TAKE 1 TABLET BY MOUTH EVERYDAY AT BEDTIME 90 tablet 3   tamsulosin (FLOMAX) 0.4 MG CAPS capsule Take 0.4 mg by mouth daily.     venlafaxine XR (EFFEXOR-XR) 150 MG 24 hr capsule TAKE 1 CAPSULE BY MOUTH DAILY WITH BREAKFAST. 90 capsule 0   No current facility-administered medications for this encounter.   BP 108/60    Pulse (!) 52    Wt 127.6 kg (281 lb 6.4 oz)    SpO2 98%    BMI 44.74 kg/m  General: NAD Neck: No JVD, no thyromegaly or thyroid nodule.  Lungs: Clear to auscultation bilaterally with normal respiratory effort. CV: Nondisplaced PMI.  Heart regular S1/S2, no S3/S4, 3/6 SEM RUSB with obscured S2.  1+ ankle edema.  No carotid bruit.  Normal pedal pulses.  Abdomen: Soft, nontender, no hepatosplenomegaly, no distention.  Skin: Intact without lesions or rashes.  Neurologic: Alert and oriented x 3.  Psych: Normal affect. Extremities: No clubbing or cyanosis.  HEENT: Normal.   Assessment/Plan: 1. CAD: Prior PCI to RCA in 8/12.  LHC (9/17) with nonobstructive disease.  I had started him on Imdur for ?microvascular angina but he developed severe headaches without any improvement in symptoms.  No chest pain now.  - Continue ASA 81 and Crestor.  2. Aortic stenosis: Bicuspid valve with  severe AS on today's echo.  He is symptomatic with exertional dyspnea.   He will need valve replacement, either by TAVR or SAVR.  Ascending aorta measured 4.4 cm on last CTA in 8/22 and 4.5 cm on echo today. Would consider ascending aorta replacement at time of AVR if > 4.5 cm.  - I will refer to Dr. Cyndia Bent for evaluation of AVR.  He will likely need repeat CTA to assess the ascending aorta.  If < 4.5 cm, would favor TAVR.  If > 4.5 cm, would consider SAVR with ascending aorta replacement.  - I will arrange for RHC/LHC as part of workup for AVR.  We discussed risks/benefits and he agrees to procedure.     3. Hyperlipidemia: Check lipids today.  4. Ascending aortic aneurysm: Associated with bicuspid aortic valve.  4.4 cm on 8/22 CTA, 4.5 cm on echo today.  Would consider ascending aorta replacement with ascending aorta > 4.5.  See discussion above.  5. Chronic diastolic CHF:  NYHA class II-III symptoms, not volume overloaded on exam.  Suspect symptoms are related to severe AS.   - Continue Lasix 40 qam/20 qpm, BMET today.  6. OSA: Continue CPAP.  7. HTN: BP controlled. With ascending aortic aneurysm, he is on beta blocker.   Followup 3 months.    [name] 07/01/2021

## 2021-07-01 NOTE — Progress Notes (Signed)
Echocardiogram ?2D Echocardiogram has been performed. ? ?Luis [name] ?07/01/2021, 8:54 AM ?

## 2021-07-01 NOTE — Telephone Encounter (Signed)
Received referral from Dr Aundra Dubin to start [facility] therapy for weight loss. Pt has Medicare insurance so weight loss medications like Wegovy are not covered. Ozempic is on his formulary as a Tier 3 drug but requires step therapy with metformin first for diabetic patients. He does not have an A1c in Epic or KPN and does not have a diagnosis of DM. ? ?Left message for pt that his insurance will not cover [place] for weight loss. ?

## 2021-07-01 NOTE — Patient Instructions (Addendum)
Labs done today, your results will be available in MyChart, we will contact you for abnormal readings. ? ?You have been referred to [place] for [facility] They will call you to arrange your appointment. ? ?You have been referred to Dr. Cyndia Bent. His office will call you to arrange your appointment ? ?You are scheduled for a Cardiac Catheterization on Thursday, March 16 with Dr. [name]. ? ?1. Please arrive at the Main Entrance A at [facility]: [place], [place] [address] at 5:30 AM (This time is two hours before your procedure to ensure your preparation). Free valet parking service is available.  ? ?Special note: Every effort is made to have your procedure done on time. Please understand that emergencies sometimes delay scheduled procedures. ? ?2. Diet: Do not eat solid foods after midnight.  You may have clear liquids until 5 AM upon the day of the procedure. ? ?3. Labs: completed today  ? ?4. Medication instructions in preparation for your procedure: ? ? Contrast Allergy: No ? ?Stop taking, Lasix (Furosemide)  Thursday, March 16, ? ?On the morning of your procedure, take  any morning medicines NOT listed above.  You may use sips of water. ? ?5. Plan to go home the same day, you will only stay overnight if medically necessary. ?6. You MUST have a responsible adult to drive you home. ?7. An adult MUST be with you the first 24 hours after you arrive home. ?8. Bring a current list of your medications, and the last time and date medication taken. ?9. Bring ID and current insurance cards. ?10.Please wear clothes that are easy to get on and off and wear slip-on shoes. ? ?Your physician recommends that you schedule a follow-up appointment in: 3 months  ? ?If you have any questions or concerns before your next appointment please send [place] a message through [place] or call our office at [phone].   ? ?TO LEAVE A MESSAGE FOR THE NURSE SELECT OPTION 2, PLEASE LEAVE A MESSAGE  INCLUDING: ?YOUR NAME ?DATE OF BIRTH ?CALL BACK NUMBER ?REASON FOR CALL**this is important as we prioritize the call backs ? ?YOU WILL RECEIVE A CALL BACK THE SAME DAY AS LONG AS YOU CALL BEFORE 4:00 PM ? ?At the [facility], you and your health needs are our priority. As part of our continuing mission to provide you with exceptional heart care, we have created designated Provider Care Teams. These Care Teams include your primary Cardiologist (physician) and Advanced Practice Providers (APPs- Physician Assistants and Nurse Practitioners) who all work together to provide you with the care you need, when you need it.  ? ?You may see any of the following providers on your designated Care Team at your next follow up: ?Dr Glori Bickers ?Dr [name] ?Darrick Grinder, NP ?Lyda Jester, PA ?Jessica Milford,NP ?Marlyce Huge, PA ?Audry Riles, PharmD ? ? ?Please be sure to bring in all your medications bottles to every appointment.  ? ? ? ? ?

## 2021-07-01 NOTE — H&P (View-Only) (Signed)
Patient ID: Luis [name], male   DOB: [birth year], 66 y.o.   MRN: [id number] ?PCP: Dr. Sarajane Jews ?Cardiology: Dr. Aundra Dubin ? ?66 y.o. with history of CAD and moderate aortic stenosis presents for follow of AS and CAD. He had RCA PCI in 8/12.    ? ?When I saw him in early 2016, he reported significant exertional dyspnea.  I had him do a Lexiscan Cardiolite in 2/16.  This showed no ischemia or infarction.  I also had him get an echo that showed ?bicuspid aortic valve and probably moderate aortic stenosis.  Given ongoing symptoms, I had him do a RHC/LHC and TEE in 2/16.  The RHC showed mildly elevated left and right heart filling pressures and the LHC showed nonobstructive CAD.  TEE confirmed bicuspid aortic valve with moderate AS.  He had PFTs in 4/16 that were within normal limits.  I started him on Lasix 20 mg daily.  He seemed to improve.   ? ?Progressive dypnea in fall 2017.  RHC/LHC in 2017 showed mildly elevated left heart filling pressure and nonobstructive CAD.  TEE showed moderate aortic stenosis, bicuspid valve.  Stable ascending aorta dilation.  ? ?Echo in 9/18 showed EF 60-65% with bicuspid aortic valve and moderate AS.  CTA chest showed 4.5 cm ascending aorta.  Repeat CTA chest in 8/19 showed stable 4.5 cm ascending aorta.  Echo in 8/20 showed EF 60-65%, moderate AS, 4.5 cm ascending aorta. CTA chest in 8/20 with 4.6 cm ascending aorta. Echo in 1/22 showed EF 60-65%, mild LVH, moderate AS with mean gradient 32 and AVA 1.6 cm^2, bicuspid aortic valve, ascending aorta 46 mm.  ? ?He is being treated for bladder cancer with chemotherapy and TURBT.  ? ?CTA chest in 8/22 with 4.4 cm ascending aorta.  ? ?Echo was done today and reviewed, EF 60-65% with mild LVH, normal RV, bicuspid aortic valve with severe AS and mean gradient 48 mmHg, AVA 0.91 cm^2.   ? ?Weight is up 7 lbs since last appointment.  Patient reports feeling "tired all the time."  He is primarily limited by his knee pain when walking but notes dyspnea  walking up stairs and with yardwork.  No chest pain.  No orthopnea/PND. No lightheadedness.   ? ?ECG (personally reviewed): NSR, LAFB ? ?Labs (2/15): K 4.4, creatinine 0.9, LDL 71, HDL 53 ?Labs (2/16): K 4.8, creatinine 1.0, LDL 62, HDL 57 ?Labs (8/16): K 4.7, creatinine 0.95, TSH normal, HCT 41.2, LDL 78, HDL 58 ?Labs (8/17): K 4.3, creatinine 1.05, HCT 40.1, TSH normal, BNP 56 ?Labs (9/17): creatinine 1.08, BNP 56 ?Labs (7/18): creatinine 1.2, LDL 53 ?Labs (7/19): LDL 68 ?Labs (8/19): K 5, creatinine 1.09 ?Labs (8/20): K 4.9, creatinine 1.09, LDL 59, HDL 50 ?Labs (8/21): K 4.6, creatinine 1.0 ?Labs (1/22): K 4, creatinine 1.02, LDL 70 ?Labs (9/22): BNP 18, K 3.8, creatinine 0.94 ? ?PMH: ?1. OSA: Using CPAP.  ?2. CAD: Cardiolite 8/12 with inferior infarct and peri-infarct ischemia, had PCI to the mid and distal RCA with Promus DES x 2.  Lexiscan Cardiolite (2/16) with no ischemia or infarction. LHC/RHC (2/16) with mean RA 12, PA 32/15, mean PCWP 18, CI 3.47; patent mid and distal RCA stents, 50-60% proximal stenosis small PDA.   ?- LHC/RHC (9/17): 60% proximal PDA; mean RA 2, PA 24/8, mean PCWP 18, CI 2.13.  ?- Cardiolite (8/17): EF 56%, normal.  ?3. Carotid stenosis: 2/15 carotid dopplers 1-39% bilateral ICA stenosis.  ?4. Colon polyps ?5. HTN ?6. Hyperlipidemia ?7. Abdominal  US (10/12) with no AAA ?8. Aortic stenosis: Echo (2/15) with EF 60-65%, mild LVH, moderate diastolic dysfunction, normal RV size/systolic function, moderate aortic stenosis with mean gradient 31 and AVA 1.16 cm2, ascending aorta 4.4 cm.  Echo (2/16) with EF 60-65%, grade II diastolic dysfunction, ?bicuspid aortic valve with moderate AS (mean gradient 35 mmHg, AVA 1.5 cm^2), mildly dilated RV with normal systolic function.  TEE (2/16) with bicuspid aortic valve, moderate AS mean gradient 30 mmHg, AVA > 1 cm^2, EF 55-60%, mild LVH, mildly dilated RV with normal systolic function, mild AI, mild MR, ascending aorta 4.2 cm.  ?- Echo (8/17): EF  60-65%, grade II diastolic dysfunction, bicuspid aortic valve with mean gradient 31 mmHg, probably moderate stenosis.  ?- TEE (9/17): EF 55-60%, bicuspid aortic valve with moderate AS with mean gradient 24 mmHg and AVA 1.05 cm^2, ascending aorta 4.2 cm.  ?- Echo (9/18): EF 60-65%, bicuspid aortic valve, AVA 1.05 cm^2 with mean gradient 24 mmHg (moderate AS).  ?- Echo (8/20): EF 60-65%, mild RV dilation with normal systolic function, moderate AS with AVA 1.46 cm^2 and mean gradient 36 mmHg, 4.5 cm ascending aorta.  ?- Echo (1/22): EF 60-65%, mild LVH, moderate AS with mean gradient 32 and AVA 1.6 cm^2, bicuspid aortic valve, ascending aorta 46 mm.  ?- Echo (3/23): EF 60-65% with mild LVH, normal RV, bicuspid aortic valve with severe AS and mean gradient 48 mmHg, AVA 0.91 cm^2. 4.5 cm ascending aorta.  ?9. Ascending aorta aneurysm: 4.4 cm on 2/15 echo. Ascending aorta 4.2 cm TEE 2/16.  Ascending aorta 4.2 cm on 9/17 TEE.  ?- CTA chest (9/18): 4.5 cm ascending aorta.  ?- CTA chest (8/19): 4.5 cm ascending aorta ?- CTA chest (8/20): 4.6 cm ascending aorta ?- CTA chest (8/22): 4.4 cm ascending aorta ?10. Chronic diastolic CHF.  ?11. PFTs (4/16) were within normal limits.  ?12. Lower extremity arterial dopplers normal in 8/17.  ?13. Bladder cancer: S/p chemo and TURBT ? ?SH: Quit smoking in 2013, married, works as Dealer. ? ?FH: Father with MI, AVR.  Sister with RyR2 gene.  ? ?ROS: All systems reviewed and negative except as per HPI.  ? ?Current Outpatient Medications  ?Medication Sig Dispense Refill  ? acetaminophen (TYLENOL) 325 MG tablet Take 650 mg by mouth every 6 (six) hours as needed.    ? aspirin 81 MG tablet Take 81 mg by mouth every morning.    ? carvedilol (COREG) 6.25 MG tablet Take 1 tablet (6.25 mg total) by mouth 2 (two) times daily. 180 tablet 3  ? diazepam (VALIUM) 5 MG tablet TAKE 1 TABLET (5 MG TOTAL) BY MOUTH EVERY 12 (TWELVE) HOURS AS NEEDED FOR ANXIETY. 60 tablet 5  ? furosemide (LASIX) 40 MG  tablet TAKE 1 TABLET (40 MG TOTAL) BY MOUTH DAILY AND 1/2 TABLETS (20 MG TOTAL) EVERY EVENING. 45 tablet 1  ? ipratropium (ATROVENT) 0.03 % nasal spray Place 2 sprays into both nostrils every 12 (twelve) hours as needed for rhinitis.    ? losartan (COZAAR) 50 MG tablet TAKE 1 TABLET (50 MG TOTAL) BY MOUTH IN THE MORNING AND AT BEDTIME. 180 tablet 3  ? MAGNESIUM PO Take 500 mg by mouth daily.    ? meclizine (ANTIVERT) 25 MG tablet Take 1 tablet (25 mg total) by mouth 3 (three) times daily as needed for dizziness. 30 tablet 0  ? nitroGLYCERIN (NITROSTAT) 0.4 MG SL tablet Place 1 tablet (0.4 mg total) under the tongue every 5 (five) minutes as  needed. For chest pain. 25 tablet 2  ? rosuvastatin (CRESTOR) 20 MG tablet TAKE 1 TABLET BY MOUTH EVERYDAY AT BEDTIME 90 tablet 3  ? tamsulosin (FLOMAX) 0.4 MG CAPS capsule Take 0.4 mg by mouth daily.    ? venlafaxine XR (EFFEXOR-XR) 150 MG 24 hr capsule TAKE 1 CAPSULE BY MOUTH DAILY WITH BREAKFAST. 90 capsule 0  ? ?No current facility-administered medications for this encounter.  ? ?BP 108/60   Pulse (!) 52   Wt 127.6 kg (281 lb 6.4 oz)   SpO2 98%   BMI 44.74 kg/m?  ?General: NAD ?Neck: No JVD, no thyromegaly or thyroid nodule.  ?Lungs: Clear to auscultation bilaterally with normal respiratory effort. ?CV: Nondisplaced PMI.  Heart regular S1/S2, no S3/S4, 3/6 SEM RUSB with obscured S2.  1+ ankle edema.  No carotid bruit.  Normal pedal pulses.  ?Abdomen: Soft, nontender, no hepatosplenomegaly, no distention.  ?Skin: Intact without lesions or rashes.  ?Neurologic: Alert and oriented x 3.  ?Psych: Normal affect. ?Extremities: No clubbing or cyanosis.  ?HEENT: Normal.  ? ?Assessment/Plan: ?1. CAD: Prior PCI to RCA in 8/12.  LHC (9/17) with nonobstructive disease.  I had started him on Imdur for ?microvascular angina but he developed severe headaches without any improvement in symptoms.  No chest pain now.  ?- Continue ASA 81 and Crestor.  ?2. Aortic stenosis: Bicuspid valve with  severe AS on today's echo.  He is symptomatic with exertional dyspnea.   He will need valve replacement, either by TAVR or SAVR.  Ascending aorta measured 4.4 cm on last CTA in 8/22 and 4.5 cm on echo today. Would

## 2021-07-08 ENCOUNTER — Ambulatory Visit (HOSPITAL_COMMUNITY)
Admission: RE | Disposition: A | Discharge status: Home / Self Care | Payer: Self-pay | Source: Home / Self Care | Attending: Cardiology

## 2021-07-08 ENCOUNTER — Other Ambulatory Visit: Payer: Self-pay

## 2021-07-08 ENCOUNTER — Telehealth (HOSPITAL_COMMUNITY): Payer: Self-pay

## 2021-07-08 ENCOUNTER — Ambulatory Visit (HOSPITAL_COMMUNITY)
Admission: RE | Admit: 2021-07-08 | Discharge: 2021-07-08 | Disposition: A | Discharge status: Home / Self Care | Payer: PPO | Attending: Cardiology | Admitting: Cardiology

## 2021-07-08 ENCOUNTER — Encounter (HOSPITAL_COMMUNITY): Payer: Self-pay | Admitting: Cardiology

## 2021-07-08 DIAGNOSIS — R0609 Other forms of dyspnea: Secondary | ICD-10-CM | POA: Diagnosis not present

## 2021-07-08 DIAGNOSIS — I350 Nonrheumatic aortic (valve) stenosis: Secondary | ICD-10-CM | POA: Diagnosis not present

## 2021-07-08 DIAGNOSIS — E785 Hyperlipidemia, unspecified: Secondary | ICD-10-CM | POA: Diagnosis not present

## 2021-07-08 DIAGNOSIS — Z955 Presence of coronary angioplasty implant and graft: Secondary | ICD-10-CM | POA: Diagnosis not present

## 2021-07-08 DIAGNOSIS — I5032 Chronic diastolic (congestive) heart failure: Secondary | ICD-10-CM | POA: Diagnosis not present

## 2021-07-08 DIAGNOSIS — Z7982 Long term (current) use of aspirin: Secondary | ICD-10-CM | POA: Insufficient documentation

## 2021-07-08 DIAGNOSIS — Z87891 Personal history of nicotine dependence: Secondary | ICD-10-CM | POA: Insufficient documentation

## 2021-07-08 DIAGNOSIS — I2510 Atherosclerotic heart disease of native coronary artery without angina pectoris: Secondary | ICD-10-CM | POA: Insufficient documentation

## 2021-07-08 DIAGNOSIS — I110 Hypertensive heart disease with heart failure: Secondary | ICD-10-CM | POA: Insufficient documentation

## 2021-07-08 DIAGNOSIS — Z79899 Other long term (current) drug therapy: Secondary | ICD-10-CM | POA: Insufficient documentation

## 2021-07-08 DIAGNOSIS — G4733 Obstructive sleep apnea (adult) (pediatric): Secondary | ICD-10-CM | POA: Insufficient documentation

## 2021-07-08 DIAGNOSIS — I7121 Aneurysm of the ascending aorta, without rupture: Secondary | ICD-10-CM | POA: Insufficient documentation

## 2021-07-08 HISTORY — PX: RIGHT HEART CATH AND CORONARY ANGIOGRAPHY: CATH118264

## 2021-07-08 LAB — POCT I-STAT EG7
Acid-Base Excess: 0 mmol/L (ref 0.0–2.0)
Acid-Base Excess: 2 mmol/L (ref 0.0–2.0)
Bicarbonate: 26.5 mmol/L (ref 20.0–28.0)
Bicarbonate: 28.1 mmol/L — ABNORMAL HIGH (ref 20.0–28.0)
Calcium, Ion: 1.09 mmol/L — ABNORMAL LOW (ref 1.15–1.40)
Calcium, Ion: 1.2 mmol/L (ref 1.15–1.40)
HCT: 34 % — ABNORMAL LOW (ref 39.0–52.0)
HCT: 36 % — ABNORMAL LOW (ref 39.0–52.0)
Hemoglobin: 11.6 g/dL — ABNORMAL LOW (ref 13.0–17.0)
Hemoglobin: 12.2 g/dL — ABNORMAL LOW (ref 13.0–17.0)
O2 Saturation: 70 %
O2 Saturation: 73 %
Potassium: 3.9 mmol/L (ref 3.5–5.1)
Potassium: 4.2 mmol/L (ref 3.5–5.1)
Sodium: 140 mmol/L (ref 135–145)
Sodium: 141 mmol/L (ref 135–145)
TCO2: 28 mmol/L (ref 22–32)
TCO2: 30 mmol/L (ref 22–32)
pCO2, Ven: 50 mmHg (ref 44–60)
pCO2, Ven: 51.9 mmHg (ref 44–60)
pH, Ven: 7.332 (ref 7.25–7.43)
pH, Ven: 7.342 (ref 7.25–7.43)
pO2, Ven: 40 mmHg (ref 32–45)
pO2, Ven: 42 mmHg (ref 32–45)

## 2021-07-08 SURGERY — RIGHT HEART CATH AND CORONARY ANGIOGRAPHY
Anesthesia: LOCAL

## 2021-07-08 MED ORDER — HEPARIN SODIUM (PORCINE) 1000 UNIT/ML IJ SOLN
INTRAMUSCULAR | Status: AC
Start: 2021-07-08 — End: ?
  Filled 2021-07-08: qty 10

## 2021-07-08 MED ORDER — MIDAZOLAM HCL 2 MG/2ML IJ SOLN
INTRAMUSCULAR | Status: AC
  Filled 2021-07-08: qty 2

## 2021-07-08 MED ORDER — SODIUM CHLORIDE 0.9 % IV SOLN: INTRAVENOUS | Status: DC

## 2021-07-08 MED ORDER — LABETALOL HCL 5 MG/ML IV SOLN: 10.0000 mg | INTRAVENOUS | Status: DC | PRN

## 2021-07-08 MED ORDER — LIDOCAINE HCL (PF) 1 % IJ SOLN
INTRAMUSCULAR | Status: DC | PRN
  Administered 2021-07-08: 5 mL

## 2021-07-08 MED ORDER — HYDRALAZINE HCL 20 MG/ML IJ SOLN: 10.0000 mg | INTRAMUSCULAR | Status: DC | PRN

## 2021-07-08 MED ORDER — SODIUM CHLORIDE 0.9% FLUSH: 3.0000 mL | Freq: Two times a day (BID) | INTRAVENOUS | Status: DC

## 2021-07-08 MED ORDER — ONDANSETRON HCL 4 MG/2ML IJ SOLN: 4.0000 mg | Freq: Four times a day (QID) | INTRAMUSCULAR | Status: DC | PRN

## 2021-07-08 MED ORDER — MIDAZOLAM HCL 2 MG/2ML IJ SOLN
INTRAMUSCULAR | Status: DC | PRN
  Administered 2021-07-08: 1 mg via INTRAVENOUS

## 2021-07-08 MED ORDER — LIDOCAINE HCL (PF) 1 % IJ SOLN
INTRAMUSCULAR | Status: AC
Start: 2021-07-08 — End: ?
  Filled 2021-07-08: qty 30

## 2021-07-08 MED ORDER — ACETAMINOPHEN 325 MG PO TABS: 650.0000 mg | ORAL_TABLET | ORAL | Status: DC | PRN

## 2021-07-08 MED ORDER — SODIUM CHLORIDE 0.9% FLUSH: 3.0000 mL | INTRAVENOUS | Status: DC | PRN

## 2021-07-08 MED ORDER — HEPARIN SODIUM (PORCINE) 1000 UNIT/ML IJ SOLN
INTRAMUSCULAR | Status: DC | PRN
  Administered 2021-07-08: 6000 [IU] via INTRAVENOUS

## 2021-07-08 MED ORDER — FENTANYL CITRATE (PF) 100 MCG/2ML IJ SOLN
INTRAMUSCULAR | Status: AC
Start: 2021-07-08 — End: ?
  Filled 2021-07-08: qty 2

## 2021-07-08 MED ORDER — SODIUM CHLORIDE 0.9 % IV SOLN: 250.0000 mL | INTRAVENOUS | Status: DC | PRN

## 2021-07-08 MED ORDER — VERAPAMIL HCL 2.5 MG/ML IV SOLN
INTRAVENOUS | Status: DC | PRN
  Administered 2021-07-08: 10 mL via INTRA_ARTERIAL

## 2021-07-08 MED ORDER — IOHEXOL 350 MG/ML SOLN
INTRAVENOUS | Status: DC | PRN
Start: 2021-07-08 — End: 2021-07-08
  Administered 2021-07-08: 75 mL via INTRACARDIAC

## 2021-07-08 MED ORDER — HEPARIN (PORCINE) IN NACL 1000-0.9 UT/500ML-% IV SOLN
INTRAVENOUS | Status: AC
  Filled 2021-07-08: qty 1000

## 2021-07-08 MED ORDER — FENTANYL CITRATE (PF) 100 MCG/2ML IJ SOLN
INTRAMUSCULAR | Status: DC | PRN
  Administered 2021-07-08: 25 ug via INTRAVENOUS

## 2021-07-08 MED ORDER — VERAPAMIL HCL 2.5 MG/ML IV SOLN
INTRAVENOUS | Status: AC
Start: 2021-07-08 — End: ?
  Filled 2021-07-08: qty 2

## 2021-07-08 SURGICAL SUPPLY — 15 items
BAND ZEPHYR COMPRESS 30 LONG (HEMOSTASIS) ×1 IMPLANT
CATH BALLN WEDGE 5F 110CM (CATHETERS) ×1 IMPLANT
CATH INFINITI 5 FR 3DRC (CATHETERS) ×1 IMPLANT
CATH INFINITI 5 FR JL3.5 (CATHETERS) ×1 IMPLANT
CATH INFINITI 5FR JL4 (CATHETERS) ×1 IMPLANT
CATH INFINITI JR4 5F (CATHETERS) ×1 IMPLANT
GLIDESHEATH SLEND SS 6F .021 (SHEATH) ×1 IMPLANT
GUIDEWIRE INQWIRE 1.5J.035X260 (WIRE) IMPLANT
INQWIRE 1.5J .035X260CM (WIRE) ×4
KIT HEART LEFT (KITS) ×2 IMPLANT
PACK CARDIAC CATHETERIZATION (CUSTOM PROCEDURE TRAY) ×2 IMPLANT
SHEATH GLIDE SLENDER 4/5FR (SHEATH) ×1 IMPLANT
SHEATH PROBE COVER 6X72 (BAG) ×1 IMPLANT
TRANSDUCER W/STOPCOCK (MISCELLANEOUS) ×2 IMPLANT
WIRE MICROINTRODUCER 60CM (WIRE) ×1 IMPLANT

## 2021-07-08 NOTE — Discharge Instructions (Signed)

## 2021-07-08 NOTE — Interval H&P Note (Signed)
History and Physical Interval Note: ? ?07/08/2021 ?7:58 AM ? ?Luis [name]  has presented today for surgery, with the diagnosis of chf.  The various methods of treatment have been discussed with the patient and family. After consideration of risks, benefits and other options for treatment, the patient has consented to  Procedure(s): ?RIGHT/LEFT HEART CATH AND CORONARY ANGIOGRAPHY (N/A) as a surgical intervention.  The patient's history has been reviewed, patient examined, no change in status, stable for surgery.  I have reviewed the patient's chart and labs.  Questions were answered to the patient's satisfaction.   ? ? ?Yoav Okane Aundra Dubin ? ? ?

## 2021-07-08 NOTE — Progress Notes (Signed)
Patient and wife was given discharge instructions. Both verbalized understanding. 

## 2021-07-08 NOTE — Telephone Encounter (Signed)
Ordered per Dr.Mclean's request ?

## 2021-07-09 MED FILL — Heparin Sod (Porcine)-NaCl IV Soln 1000 Unit/500ML-0.9%: INTRAVENOUS | Qty: 1000 | Status: AC

## 2021-07-13 ENCOUNTER — Encounter (HOSPITAL_COMMUNITY): Payer: Self-pay | Admitting: Cardiology

## 2021-07-14 ENCOUNTER — Other Ambulatory Visit (HOSPITAL_COMMUNITY): Payer: Self-pay | Admitting: *Deleted

## 2021-07-14 MED ORDER — FUROSEMIDE 40 MG PO TABS: ORAL_TABLET | ORAL | 3 refills | Status: DC

## 2021-07-15 ENCOUNTER — Other Ambulatory Visit: Payer: Self-pay

## 2021-07-15 ENCOUNTER — Ambulatory Visit (HOSPITAL_COMMUNITY)
Admission: RE | Admit: 2021-07-15 | Discharge: 2021-07-15 | Disposition: A | Discharge status: Home / Self Care | Payer: PPO | Source: Ambulatory Visit | Attending: Surgery | Admitting: Surgery

## 2021-07-15 DIAGNOSIS — I719 Aortic aneurysm of unspecified site, without rupture: Secondary | ICD-10-CM | POA: Diagnosis not present

## 2021-07-15 DIAGNOSIS — I712 Thoracic aortic aneurysm, without rupture, unspecified: Secondary | ICD-10-CM | POA: Diagnosis not present

## 2021-07-15 DIAGNOSIS — I350 Nonrheumatic aortic (valve) stenosis: Secondary | ICD-10-CM | POA: Diagnosis not present

## 2021-07-15 DIAGNOSIS — I7121 Aneurysm of the ascending aorta, without rupture: Secondary | ICD-10-CM | POA: Insufficient documentation

## 2021-07-15 DIAGNOSIS — K760 Fatty (change of) liver, not elsewhere classified: Secondary | ICD-10-CM | POA: Diagnosis not present

## 2021-07-15 MED ORDER — IOHEXOL 350 MG/ML SOLN
100.0000 mL | Freq: Once | INTRAVENOUS | Status: AC | PRN
  Administered 2021-07-15: 100 mL via INTRAVENOUS

## 2021-07-16 DIAGNOSIS — G4733 Obstructive sleep apnea (adult) (pediatric): Secondary | ICD-10-CM | POA: Diagnosis not present

## 2021-07-16 DIAGNOSIS — F419 Anxiety disorder, unspecified: Secondary | ICD-10-CM | POA: Diagnosis not present

## 2021-07-27 ENCOUNTER — Encounter: Payer: Self-pay | Admitting: Physician Assistant

## 2021-07-27 ENCOUNTER — Other Ambulatory Visit: Payer: Self-pay | Admitting: Physician Assistant

## 2021-07-27 DIAGNOSIS — I350 Nonrheumatic aortic (valve) stenosis: Secondary | ICD-10-CM

## 2021-07-27 DIAGNOSIS — Z209 Contact with and (suspected) exposure to unspecified communicable disease: Secondary | ICD-10-CM

## 2021-08-06 ENCOUNTER — Ambulatory Visit (HOSPITAL_COMMUNITY)
Admission: RE | Admit: 2021-08-06 | Discharge: 2021-08-06 | Disposition: A | Discharge status: Home / Self Care | Payer: PPO | Source: Ambulatory Visit | Attending: Physician Assistant | Admitting: Physician Assistant

## 2021-08-06 DIAGNOSIS — I350 Nonrheumatic aortic (valve) stenosis: Secondary | ICD-10-CM

## 2021-08-06 MED ORDER — IOHEXOL 350 MG/ML SOLN
95.0000 mL | Freq: Once | INTRAVENOUS | Status: AC | PRN
  Administered 2021-08-06: 95 mL via INTRAVENOUS

## 2021-08-09 DIAGNOSIS — M79672 Pain in left foot: Secondary | ICD-10-CM | POA: Diagnosis not present

## 2021-08-15 ENCOUNTER — Other Ambulatory Visit (HOSPITAL_COMMUNITY): Payer: Self-pay | Admitting: Cardiology

## 2021-08-15 ENCOUNTER — Other Ambulatory Visit: Payer: Self-pay | Admitting: Family Medicine

## 2021-08-15 DIAGNOSIS — F411 Generalized anxiety disorder: Secondary | ICD-10-CM

## 2021-08-16 DIAGNOSIS — G4733 Obstructive sleep apnea (adult) (pediatric): Secondary | ICD-10-CM | POA: Diagnosis not present

## 2021-08-16 DIAGNOSIS — F419 Anxiety disorder, unspecified: Secondary | ICD-10-CM | POA: Diagnosis not present

## 2021-08-18 ENCOUNTER — Ambulatory Visit: Payer: PPO

## 2021-08-18 ENCOUNTER — Telehealth (INDEPENDENT_AMBULATORY_CARE_PROVIDER_SITE_OTHER): Payer: PPO

## 2021-08-18 VITALS — Ht 66.0 in | Wt 276.0 lb

## 2021-08-18 DIAGNOSIS — Z0000 Encounter for general adult medical examination without abnormal findings: Secondary | ICD-10-CM | POA: Diagnosis not present

## 2021-08-18 NOTE — Progress Notes (Signed)
? ?Subjective:  ? Luis [name] is a 66 y.o. male who presents for Medicare Annual/Subsequent preventive examination. ? ?Review of Systems    ?Virtual Visit via Telephone Note ? ?I connected with  Luis [name] on 08/18/21 at  9:30 AM EDT by telephone and verified that I am speaking with the correct person using two identifiers. ? ?Location: ?Patient: Home ?Provider: Office ?Persons participating in the virtual visit: patient/Nurse Health Advisor ?  ?I discussed the limitations, risks, security and privacy concerns of performing an evaluation and management service by telephone and the availability of in person appointments. The patient expressed understanding and agreed to proceed. ? ?Interactive audio and video telecommunications were attempted between this nurse and patient, however failed, due to patient having technical difficulties OR patient did not have access to video capability.  We continued and completed visit with audio only. ? ?Some vital signs may be absent or patient reported.  ? ?Criselda Peaches, LPN  ?Cardiac Risk Factors include: advanced age (>69mn, >>41women);hypertension;male gender ? ?   ?Objective:  ?  ?Today's Vitals  ? 08/18/21 0934  ?Weight: 276 lb (125.2 kg)  ?Height: '5\' 6"'$  (1.676 m)  ? ?Body mass index is 44.55 kg/m?. ? ? ?  08/18/2021  ?  9:45 AM 07/08/2021  ?  5:50 AM 12/16/2020  ?  8:37 AM 10/14/2020  ?  8:23 AM 08/12/2020  ?  8:57 AM 12/13/2019  ?  8:01 AM 10/21/2019  ?  5:59 AM  ?Advanced Directives  ?Does Patient Have a Medical Advance Directive? Yes Yes No Yes Yes Yes Yes  ?Type of AParamed[place]of [place]Living will [place] will  [place] Living will  Living will  ?Does patient want to make changes to medical advance directive? No - Patient declined     No - Guardian declined No - Patient declined  ?Copy of [place] Chart? No - copy requested   No - copy requested      ?Would patient like information on creating a medical advance directive?   No - Patient declined      ? ? ?Current Medications (verified) ?Outpatient Encounter Medications as of 08/18/2021  ?Medication Sig  ? acetaminophen (TYLENOL) 325 MG tablet Take 650 mg by mouth every 6 (six) hours as needed for moderate pain.  ? aspirin 81 MG tablet Take 81 mg by mouth every morning.  ? carvedilol (COREG) 6.25 MG tablet TAKE 1 TABLET BY MOUTH TWICE A DAY  ? diazepam (VALIUM) 5 MG tablet TAKE 1 TABLET (5 MG TOTAL) BY MOUTH EVERY 12 (TWELVE) HOURS AS NEEDED FOR ANXIETY.  ? furosemide (LASIX) 40 MG tablet TAKE 1 TABLET (40 MG TOTAL) BY MOUTH DAILY AND 1/2 TABLETS (20 MG TOTAL) EVERY EVENING.  ? ipratropium (ATROVENT) 0.03 % nasal spray Place 2 sprays into both nostrils every 12 (twelve) hours as needed for rhinitis.  ? losartan (COZAAR) 50 MG tablet TAKE 1 TABLET (50 MG TOTAL) BY MOUTH IN THE MORNING AND AT BEDTIME.  ? MAGNESIUM PO Take 500 mg by mouth daily.  ? meclizine (ANTIVERT) 25 MG tablet Take 1 tablet (25 mg total) by mouth 3 (three) times daily as needed for dizziness.  ? nitroGLYCERIN (NITROSTAT) 0.4 MG SL tablet Place 1 tablet (0.4 mg total) under the tongue every 5 (five) minutes as needed. For chest pain.  ? rosuvastatin (CRESTOR) 20 MG tablet TAKE 1 TABLET BY MOUTH EVERYDAY AT BEDTIME  ?  tamsulosin (FLOMAX) 0.4 MG CAPS capsule Take 0.4 mg by mouth daily.  ? venlafaxine XR (EFFEXOR-XR) 150 MG 24 hr capsule TAKE 1 CAPSULE BY MOUTH DAILY WITH BREAKFAST.  ? ?No facility-administered encounter medications on file as of 08/18/2021.  ? ? ?Allergies (verified) ?Oxycodone  ? ?History: ?Past Medical History:  ?Diagnosis Date  ? Anxiety   ? takes Valium as needed  ? Aortic stenosis, moderate   ? Arthritis   ? Ascending aortic aneurysm ([place])   ? CAD (coronary artery disease)   ? a. s/p PCI of the RCA 8/12 with DES by Dr Burt Knack, preserved EF. b. LHC/RHC (2/16) with mean RA 12, PA 32/15, mean PCWP 18, CI 3.47; patent mid and  distal RCA stents, 50-60% proximal stenosis small PDA.     ? Carotid stenosis   ? a. Carotid US (05/2013):  Bilateral 1-39% ICA; L thyroid nodule (prior hx of aspiration).  ? Chronic diastolic CHF (congestive heart failure) ([place])   ? Depression   ? Essential hypertension   ? GERD (gastroesophageal reflux disease)   ? if needed will take OTC meds   ? Heart murmur   ? History of colonic polyps   ? hyperplastic  ? Hyperlipidemia   ? Joint pain   ? Lesion of bladder   ? Myocardial infarction [facility]) 2012  ? Obesity (BMI 30-39.9) 02/29/2016  ? Restless leg   ? Sleep apnea   ? uses cpap  ? Tubular adenoma of colon   ? Vertigo   ? takes Meclizine as needed  ? ?Past Surgical History:  ?Procedure Laterality Date  ? CATARACT EXTRACTION   4 YRS AGO  ? BOTH EYES  ? CHOLECYSTECTOMY  07/21/2011  ? Procedure: LAPAROSCOPIC CHOLECYSTECTOMY WITH INTRAOPERATIVE CHOLANGIOGRAM;  Surgeon: Shann Medal, MD;  Location: [facility];  Service: General;  Laterality: N/A;  ? CORONARY ANGIOPLASTY  2012  ? 2 stents  ? coronary stenting    ? s/p PCI of the RCA by Dr Burt Knack 8/12 with 2 promus stents  ? CYSTOSCOPY W/ RETROGRADES Bilateral 12/13/2019  ? Procedure: CYSTOSCOPY WITH RETROGRADE PYELOGRAM;  Surgeon: Alexis Frock, MD;  Location: [facility];  Service: Urology;  Laterality: Bilateral;  ? CYSTOSCOPY W/ RETROGRADES Bilateral 10/14/2020  ? Procedure: CYSTOSCOPY WITH RETROGRADE PYELOGRAM;  Surgeon: Alexis Frock, MD;  Location: [facility];  Service: Urology;  Laterality: Bilateral;  ? CYSTOSCOPY W/ RETROGRADES Bilateral 12/16/2020  ? Procedure: CYSTOSCOPY WITH RETROGRADE PYELOGRAM;  Surgeon: Alexis Frock, MD;  Location: [facility];  Service: Urology;  Laterality: Bilateral;  ? LEFT AND RIGHT HEART CATHETERIZATION WITH CORONARY ANGIOGRAM N/A 06/23/2014  ? Procedure: LEFT AND RIGHT HEART CATHETERIZATION WITH CORONARY ANGIOGRAM;  Surgeon: Larey Dresser, MD;  Location: [facility] CATH LAB;  Service:  Cardiovascular;  Laterality: N/A;  ? NECK SURGERY  03/23/09  ? per Dr. Lorin Mercy, cervical fusion   ? right elbow surgery    ? RIGHT HEART CATH AND CORONARY ANGIOGRAPHY N/A 07/08/2021  ? Procedure: RIGHT HEART CATH AND CORONARY ANGIOGRAPHY;  Surgeon: Larey Dresser, MD;  Location: [place] CV LAB;  Service: Cardiovascular;  Laterality: N/A;  ? solonscopy  05/23/08  ? per Dr. Wynona Luna hemorrhoids only, repeat in 5 years  ? TEE WITHOUT CARDIOVERSION N/A 06/23/2014  ? Procedure: TRANSESOPHAGEAL ECHOCARDIOGRAM (TEE);  Surgeon: Larey Dresser, MD;  Location: [place];  Service: Cardiovascular;  Laterality: N/A;  ? TEE WITHOUT CARDIOVERSION N/A 01/21/2016  ? Procedure: TRANSESOPHAGEAL ECHOCARDIOGRAM (TEE);  Surgeon: Kirk Ruths  Claris Gladden, MD;  Location: [place];  Service: Cardiovascular;  Laterality: N/A;  ? TONSILLECTOMY    ? TRANSURETHRAL RESECTION OF BLADDER TUMOR N/A 10/21/2019  ? Procedure: TRANSURETHRAL RESECTION OF BLADDER TUMOR (TURBT);  Surgeon: Kathie Rhodes, MD;  Location: [facility];  Service: Urology;  Laterality: N/A;  ? TRANSURETHRAL RESECTION OF BLADDER TUMOR N/A 12/13/2019  ? Procedure: TRANSURETHRAL RESECTION OF BLADDER TUMOR (TURBT);  Surgeon: Alexis Frock, MD;  Location: [facility];  Service: Urology;  Laterality: N/A;  1 HR  ? TRANSURETHRAL RESECTION OF BLADDER TUMOR N/A 10/14/2020  ? Procedure: TRANSURETHRAL RESECTION OF BLADDER TUMOR (TURBT);  Surgeon: Alexis Frock, MD;  Location: [facility];  Service: Urology;  Laterality: N/A;  ? TRANSURETHRAL RESECTION OF BLADDER TUMOR N/A 12/16/2020  ? Procedure: RESTAGING TRANSURETHRAL RESECTION OF BLADDER TUMOR (TURBT);  Surgeon: Alexis Frock, MD;  Location: [facility];  Service: Urology;  Laterality: N/A;  ? ?Family History  ?Problem Relation Age of Onset  ? Lung cancer Mother   ?     lung  ? Esophageal cancer Cousin   ? Colon cancer Neg Hx   ? Rectal cancer Neg Hx   ? Stomach  cancer Neg Hx   ? ?Social History  ? ?Socioeconomic History  ? Marital status: Married  ?  Spouse name: Not on file  ? Number of children: 4  ? Years of education: Not on file  ? Highest education level: Not on file  ?Delford Field

## 2021-08-18 NOTE — Patient Instructions (Addendum)
?Mr. Willinger , ?Thank you for taking time to come for your Medicare Wellness Visit. I appreciate your ongoing commitment to your health goals. Please review the following plan we discussed and let me know if I can assist you in the future.  ? ?These are the goals we discussed: ? Goals   ? ?   Patient Stated (pt-stated)   ?   Stay healthy and see my grandkids graduate.  ?  ? ?  ?  ?This is a list of the screening recommended for you and due dates:  ?Health Maintenance  ?Topic Date Due  ? Hepatitis C Screening: USPSTF Recommendation to screen - Ages 68-79 yo.  Never done  ? COVID-19 Vaccine (4 - Booster for Pfizer series) 09/03/2021*  ? Zoster (Shingles) Vaccine (1 of 2) 11/17/2021*  ? Pneumonia Vaccine (1 - PCV) 08/19/2022*  ? Tetanus Vaccine  08/19/2022*  ? Flu Shot  11/23/2021  ? Colon Cancer Screening  10/03/2025  ? HPV Vaccine  Aged Out  ?*Topic was postponed. The date shown is not the original due date.  ? ? ? ?Advanced directives: Yes Patient will submit copy ? ? ?Conditions/risks identified: None ? ?Next appointment: Follow up in one year for your annual wellness visit.  ? ?Preventive Care 80 Years and Older, Male ?Preventive care refers to lifestyle choices and visits with your health care provider that can promote health and wellness. ?What does preventive care include? ?A yearly physical exam. This is also called an annual well check. ?Dental exams once or twice a year. ?Routine eye exams. Ask your health care provider how often you should have your eyes checked. ?Personal lifestyle choices, including: ?Daily care of your teeth and gums. ?Regular physical activity. ?Eating a healthy diet. ?Avoiding tobacco and drug use. ?Limiting alcohol use. ?Practicing safe sex. ?Taking low doses of aspirin every day. ?Taking vitamin and mineral supplements as recommended by your health care provider. ?What happens during an annual well check? ?The services and screenings done by your health care provider during your  annual well check will depend on your age, overall health, lifestyle risk factors, and family history of disease. ?Counseling  ?Your health care provider may ask you questions about your: ?Alcohol use. ?Tobacco use. ?Drug use. ?Emotional well-being. ?Home and relationship well-being. ?Sexual activity. ?Eating habits. ?History of falls. ?Memory and ability to understand (cognition). ?Work and work Statistician. ?Screening  ?You may have the following tests or measurements: ?Height, weight, and BMI. ?Blood pressure. ?Lipid and cholesterol levels. These may be checked every 5 years, or more frequently if you are over 30 years old. ?Skin check. ?Lung cancer screening. You may have this screening every year starting at age 51 if you have a 30-pack-year history of smoking and currently smoke or have quit within the past 15 years. ?Fecal occult blood test (FOBT) of the stool. You may have this test every year starting at age 33. ?Flexible sigmoidoscopy or colonoscopy. You may have a sigmoidoscopy every 5 years or a colonoscopy every 10 years starting at age 52. ?Prostate cancer screening. Recommendations will vary depending on your family history and other risks. ?Hepatitis C blood test. ?Hepatitis B blood test. ?Sexually transmitted disease (STD) testing. ?Diabetes screening. This is done by checking your blood sugar (glucose) after you have not eaten for a while (fasting). You may have this done every 1-3 years. ?Abdominal aortic aneurysm (AAA) screening. You may need this if you are a current or former smoker. ?Osteoporosis. You may be screened starting  at age 47 if you are at high risk. ?Talk with your health care provider about your test results, treatment options, and if necessary, the need for more tests. ?Vaccines  ?Your health care provider may recommend certain vaccines, such as: ?Influenza vaccine. This is recommended every year. ?Tetanus, diphtheria, and acellular pertussis (Tdap, Td) vaccine. You may need a Td  booster every 10 years. ?Zoster vaccine. You may need this after age 22. ?Pneumococcal 13-valent conjugate (PCV13) vaccine. One dose is recommended after age 9. ?Pneumococcal polysaccharide (PPSV23) vaccine. One dose is recommended after age 75. ?Talk to your health care provider about which screenings and vaccines you need and how often you need them. ?This information is not intended to replace advice given to you by your health care provider. Make sure you discuss any questions you have with your health care provider. ?Document Released: 05/08/2015 Document Revised: 12/30/2015 Document Reviewed: 02/10/2015 ?Elsevier Interactive Patient Education ? 2017 [place]. ? ?Fall Prevention in the Home ?Falls can cause injuries. They can happen to people of all ages. There are many things you can do to make your home safe and to help prevent falls. ?What can I do on the outside of my home? ?Regularly fix the edges of walkways and driveways and fix any cracks. ?Remove anything that might make you trip as you walk through a door, such as a raised step or threshold. ?Trim any bushes or trees on the path to your home. ?Use bright outdoor lighting. ?Clear any walking paths of anything that might make someone trip, such as rocks or tools. ?Regularly check to see if handrails are loose or broken. Make sure that both sides of any steps have handrails. ?Any raised decks and porches should have guardrails on the edges. ?Have any leaves, snow, or ice cleared regularly. ?Use sand or salt on walking paths during winter. ?Clean up any spills in your garage right away. This includes oil or grease spills. ?What can I do in the bathroom? ?Use night lights. ?Install grab bars by the toilet and in the tub and shower. Do not use towel bars as grab bars. ?Use non-skid mats or decals in the tub or shower. ?If you need to sit down in the shower, use a plastic, non-slip stool. ?Keep the floor dry. Clean up any water that spills on the floor  as soon as it happens. ?Remove soap buildup in the tub or shower regularly. ?Attach bath mats securely with double-sided non-slip rug tape. ?Do not have throw rugs and other things on the floor that can make you trip. ?What can I do in the bedroom? ?Use night lights. ?Make sure that you have a light by your bed that is easy to reach. ?Do not use any sheets or blankets that are too big for your bed. They should not hang down onto the floor. ?Have a firm chair that has side arms. You can use this for support while you get dressed. ?Do not have throw rugs and other things on the floor that can make you trip. ?What can I do in the kitchen? ?Clean up any spills right away. ?Avoid walking on wet floors. ?Keep items that you use a lot in easy-to-reach places. ?If you need to reach something above you, use a strong step stool that has a grab bar. ?Keep electrical cords out of the way. ?Do not use floor polish or wax that makes floors slippery. If you must use wax, use non-skid floor wax. ?Do not have throw rugs  and other things on the floor that can make you trip. ?What can I do with my stairs? ?Do not leave any items on the stairs. ?Make sure that there are handrails on both sides of the stairs and use them. Fix handrails that are broken or loose. Make sure that handrails are as long as the stairways. ?Check any carpeting to make sure that it is firmly attached to the stairs. Fix any carpet that is loose or worn. ?Avoid having throw rugs at the top or bottom of the stairs. If you do have throw rugs, attach them to the floor with carpet tape. ?Make sure that you have a light switch at the top of the stairs and the bottom of the stairs. If you do not have them, ask someone to add them for you. ?What else can I do to help prevent falls? ?Wear shoes that: ?Do not have high heels. ?Have rubber bottoms. ?Are comfortable and fit you well. ?Are closed at the toe. Do not wear sandals. ?If you use a stepladder: ?Make sure that it is  fully opened. Do not climb a closed stepladder. ?Make sure that both sides of the stepladder are locked into place. ?Ask someone to hold it for you, if possible. ?Clearly mark and make sure that you ca

## 2021-08-19 DIAGNOSIS — M216X2 Other acquired deformities of left foot: Secondary | ICD-10-CM | POA: Diagnosis not present

## 2021-08-19 DIAGNOSIS — M722 Plantar fascial fibromatosis: Secondary | ICD-10-CM | POA: Insufficient documentation

## 2021-08-19 DIAGNOSIS — M216X9 Other acquired deformities of unspecified foot: Secondary | ICD-10-CM | POA: Insufficient documentation

## 2021-08-19 NOTE — Telephone Encounter (Signed)
Done

## 2021-08-19 NOTE — Telephone Encounter (Signed)
-----   Message from Criselda Peaches, LPN sent at 06/16/7979 11:51 AM EDT ----- ?Regarding: Labs ?Hi Dr Sarajane Jews just fyi this patient needs an order for Hep-C Screening. Request was also included in [facility] nurse note forwarded to you 08/18/21. ? ?Rolene Arbour LPN  ? ?

## 2021-08-31 DIAGNOSIS — G4733 Obstructive sleep apnea (adult) (pediatric): Secondary | ICD-10-CM | POA: Diagnosis not present

## 2021-09-01 ENCOUNTER — Encounter: Payer: Self-pay | Admitting: Surgery

## 2021-09-01 ENCOUNTER — Encounter: Payer: Self-pay | Admitting: *Deleted

## 2021-09-01 ENCOUNTER — Institutional Professional Consult (permissible substitution) (INDEPENDENT_AMBULATORY_CARE_PROVIDER_SITE_OTHER): Payer: PPO | Admitting: Surgery

## 2021-09-01 ENCOUNTER — Other Ambulatory Visit: Payer: Self-pay | Admitting: *Deleted

## 2021-09-01 VITALS — BP 126/74 | HR 60 | Resp 20 | Wt 276.0 lb

## 2021-09-01 DIAGNOSIS — I350 Nonrheumatic aortic (valve) stenosis: Secondary | ICD-10-CM

## 2021-09-01 DIAGNOSIS — I7121 Aneurysm of the ascending aorta, without rupture: Secondary | ICD-10-CM

## 2021-09-01 NOTE — Progress Notes (Signed)
Pre Surgical Assessment: 5 M Walk Test ? ?67M=16.52f ? ?5 Meter Walk Test- trial 1: 10.12 seconds ?5 Meter Walk Test- trial 2: 7.08 seconds ?5 Meter Walk Test- trial 3: 7.98 seconds ?5 Meter Walk Test Average: 8.39 seconds ?  ?

## 2021-09-01 NOTE — Progress Notes (Signed)
Patient ID: Luis [name], male   DOB: 06-16-1955, 66 y.o.   MRN: [id number] ? ? [facility]   ?MULTIDISCIPLINARY HEART VALVE CLINIC ?  ? ? ? ?   ?[address] ?      [place] [address] ?            [phone]   ?      ? ?CARDIOTHORACIC SURGERY CONSULTATION REPORT ? ?PCP is Laurey Morale, MD ?Referring Provider is [name], MD ?Primary Cardiologist is [name], MD ? ?Reason for consultation: Severe bicuspid aortic valve stenosis and ascending aortic aneurysm ? ?HPI: ? ?The patient is a 66 year old gentleman with a history of hypertension, hyperlipidemia, coronary artery disease status post PCI of the RCA in 2012 with DES,  bladder cancer that has been treated with TURBT and chemotherapy with BCG, and moderate aortic stenosis.  His most recent echo on 07/01/2021 showed an increase in the mean gradient to 48 mmHg with a peak gradient of 76 mmHg.  Valve area by VTI was 0.91 cm?[name]  The valve appeared bicuspid.  The ascending aorta was measured at 4.5 cm.  Left ventricular ejection fraction was 60 to 65% with mild concentric LVH and grade 1 diastolic dysfunction.  Cardiac catheterization on 07/08/2021 showed patent stents in the RCA and otherwise no significant disease. ? ?He is here today with his wife.  He continues to have exertional shortness of breath and fatigue.  He denies any chest pain or pressure.  He denies dizziness and syncope.  He has had no orthopnea or PND.  He denies peripheral edema.  His activity has primarily been limited by degenerative arthritis in his knees. ? ?Past Medical History:  ?Diagnosis Date  ? Anxiety   ? takes Valium as needed  ? Aortic stenosis, moderate   ? Arthritis   ? Ascending aortic aneurysm ([place])   ? CAD (coronary artery disease)   ? a. s/p PCI of the RCA 8/12 with DES by Dr Burt Knack, preserved EF. b. LHC/RHC (2/16) with mean RA 12, PA 32/15, mean PCWP 18, CI 3.47; patent mid and distal RCA stents, 50-60% proximal stenosis small PDA.     ?  Carotid stenosis   ? a. Carotid US (05/2013):  Bilateral 1-39% ICA; L thyroid nodule (prior hx of aspiration).  ? Chronic diastolic CHF (congestive heart failure) ([place])   ? Depression   ? Essential hypertension   ? GERD (gastroesophageal reflux disease)   ? if needed will take OTC meds   ? Heart murmur   ? History of colonic polyps   ? hyperplastic  ? Hyperlipidemia   ? Joint pain   ? Lesion of bladder   ? Myocardial infarction [facility]) 2012  ? Obesity (BMI 30-39.9) 02/29/2016  ? Restless leg   ? Sleep apnea   ? uses cpap  ? Tubular adenoma of colon   ? Vertigo   ? takes Meclizine as needed  ? ? ?Past Surgical History:  ?Procedure Laterality Date  ? CATARACT EXTRACTION   4 YRS AGO  ? BOTH EYES  ? CHOLECYSTECTOMY  07/21/2011  ? Procedure: LAPAROSCOPIC CHOLECYSTECTOMY WITH INTRAOPERATIVE CHOLANGIOGRAM;  Surgeon: Shann Medal, MD;  Location: [facility];  Service: General;  Laterality: N/A;  ? CORONARY ANGIOPLASTY  2012  ? 2 stents  ? coronary stenting    ? s/p PCI of the RCA by Dr Burt Knack 8/12 with 2 promus stents  ? CYSTOSCOPY W/ RETROGRADES Bilateral 12/13/2019  ? Procedure:  CYSTOSCOPY WITH RETROGRADE PYELOGRAM;  Surgeon: Alexis Frock, MD;  Location: [facility];  Service: Urology;  Laterality: Bilateral;  ? CYSTOSCOPY W/ RETROGRADES Bilateral 10/14/2020  ? Procedure: CYSTOSCOPY WITH RETROGRADE PYELOGRAM;  Surgeon: Alexis Frock, MD;  Location: [facility];  Service: Urology;  Laterality: Bilateral;  ? CYSTOSCOPY W/ RETROGRADES Bilateral 12/16/2020  ? Procedure: CYSTOSCOPY WITH RETROGRADE PYELOGRAM;  Surgeon: Alexis Frock, MD;  Location: [facility];  Service: Urology;  Laterality: Bilateral;  ? LEFT AND RIGHT HEART CATHETERIZATION WITH CORONARY ANGIOGRAM N/A 06/23/2014  ? Procedure: LEFT AND RIGHT HEART CATHETERIZATION WITH CORONARY ANGIOGRAM;  Surgeon: Larey Dresser, MD;  Location: [facility] CATH LAB;  Service: Cardiovascular;  Laterality: N/A;  ? NECK SURGERY  03/23/09  ? per  Dr. Lorin Mercy, cervical fusion   ? right elbow surgery    ? RIGHT HEART CATH AND CORONARY ANGIOGRAPHY N/A 07/08/2021  ? Procedure: RIGHT HEART CATH AND CORONARY ANGIOGRAPHY;  Surgeon: Larey Dresser, MD;  Location: [place] CV LAB;  Service: Cardiovascular;  Laterality: N/A;  ? solonscopy  05/23/08  ? per Dr. Wynona Luna hemorrhoids only, repeat in 5 years  ? TEE WITHOUT CARDIOVERSION N/A 06/23/2014  ? Procedure: TRANSESOPHAGEAL ECHOCARDIOGRAM (TEE);  Surgeon: Larey Dresser, MD;  Location: [place];  Service: Cardiovascular;  Laterality: N/A;  ? TEE WITHOUT CARDIOVERSION N/A 01/21/2016  ? Procedure: TRANSESOPHAGEAL ECHOCARDIOGRAM (TEE);  Surgeon: Larey Dresser, MD;  Location: [place];  Service: Cardiovascular;  Laterality: N/A;  ? TONSILLECTOMY    ? TRANSURETHRAL RESECTION OF BLADDER TUMOR N/A 10/21/2019  ? Procedure: TRANSURETHRAL RESECTION OF BLADDER TUMOR (TURBT);  Surgeon: Kathie Rhodes, MD;  Location[facility];  Service: Urology;  Laterality: N/A;  ? TRANSURETHRAL RESECTION OF BLADDER TUMOR N/A 12/13/2019  ? Procedure: TRANSURETHRAL RESECTION OF BLADDER TUMOR (TURBT);  Surgeon: Alexis Frock, MD;  Location: [facility];  Service: Urology;  Laterality: N/A;  1 HR  ? TRANSURETHRAL RESECTION OF BLADDER TUMOR N/A 10/14/2020  ? Procedure: TRANSURETHRAL RESECTION OF BLADDER TUMOR (TURBT);  Surgeon: Alexis Frock, MD;  Location: [facility];  Service: Urology;  Laterality: N/A;  ? TRANSURETHRAL RESECTION OF BLADDER TUMOR N/A 12/16/2020  ? Procedure: RESTAGING TRANSURETHRAL RESECTION OF BLADDER TUMOR (TURBT);  Surgeon: Alexis Frock, MD;  Location: [facility];  Service: Urology;  Laterality: N/A;  ? ? ?Family History  ?Problem Relation Age of Onset  ? Lung cancer Mother   ?     lung  ? Esophageal cancer Cousin   ? Colon cancer Neg Hx   ? Rectal cancer Neg Hx   ? Stomach cancer Neg Hx   ? ? ?Social History  ? ?Socioeconomic History  ?  Marital status: Married  ?  Spouse name: Not on file  ? Number of children: 4  ? Years of education: Not on file  ? Highest education level: Not on file  ?Occupational History  ? Occupation: Airline pilot  ?  Employer: [employer], systems  ?Tobacco Use  ? Smoking status: Former  ?  Packs/day: 2.00  ?  Years: 40.00  ?  Pack years: 80.00  ?  Types: Cigarettes  ?  Quit date: 2012  ?  Years since quitting: 11.3  ? Smokeless tobacco: Never  ?Vaping Use  ? Vaping Use: Never used  ?Substance and Sexual Activity  ? Alcohol use: No  ?  Alcohol/week: 0.0 standard drinks  ? Drug use: No  ? Sexual activity: Not  on file  ?Other Topics Concern  ? Not on file  ?Social History Narrative  ? Not on file  ? ?Social Determinants of Health  ? ?Financial Resource Strain: Low Risk   ? Difficulty of Paying Living Expenses: Not hard at all  ?Food Insecurity: No Food Insecurity  ? Worried About Charity fundraiser in the Last Year: Never true  ? Ran Out of Food in the Last Year: Never true  ?Transportation Needs: No Transportation Needs  ? Lack of Transportation (Medical): No  ? Lack of Transportation (Non-Medical): No  ?Physical Activity: Insufficiently Active  ? Days of Exercise per Week: 3 days  ? Minutes of Exercise per Session: 30 min  ?Stress: No Stress Concern Present  ? Feeling of Stress : Not at all  ?Social Connections: Moderately Isolated  ? Frequency of Communication with Friends and Family: More than three times a week  ? Frequency of Social Gatherings with Friends and Family: More than three times a week  ? Attends Religious Services: Never  ? Active Member of Clubs or Organizations: No  ? Attends Archivist Meetings: Never  ? Marital Status: Married  ?Intimate Partner Violence: Not At Risk  ? Fear of Current or Ex-Partner: No  ? Emotionally Abused: No  ? Physically Abused: No  ? Sexually Abused: No  ? ? ?Prior to Admission medications   ?Medication Sig Start Date End Date Taking? Authorizing Provider   ?acetaminophen (TYLENOL) 325 MG tablet Take 650 mg by mouth every 6 (six) hours as needed for moderate pain.   Yes [provider]  ?aspirin 81 MG tablet Take 81 mg by mouth every morning.   Yes Provider

## 2021-09-13 NOTE — Pre-Procedure Instructions (Signed)
Surgical Instructions    Your procedure is scheduled on Thursday, May 25th.  Report to [facility] Main Entrance "A" at 5:30 A.M., then check in with the Admitting office.  Call this number if you have problems the morning of surgery:  [phone]   If you have any questions prior to your surgery date call [phone]: Open Monday-Friday 8am-4pm    Remember:  Do not eat or drink after midnight the night before your surgery    Take these medicines the morning of surgery with A SIP OF WATER  carvedilol (COREG) tamsulosin (FLOMAX) venlafaxine XR (EFFEXOR-XR)  As needed: acetaminophen (TYLENOL)  diazepam (VALIUM) Nasal spray meclizine (ANTIVERT)  nitroGLYCERIN (NITROSTAT)-please let a nurse know if you had to use this.  As of today, STOP taking any Aspirin (unless otherwise instructed by your surgeon) Aleve, Naproxen, Ibuprofen, Motrin, Advil, Goody's, BC's, all herbal medications, fish oil, and all vitamins.                     Do NOT Smoke (Tobacco/Vaping) for 24 hours prior to your procedure.  If you use a CPAP at night, you may bring your mask/headgear for your overnight stay.   Contacts, glasses, piercing's, hearing aid's, dentures or partials may not be worn into surgery, please bring cases for these belongings.    For patients admitted to the hospital, discharge time will be determined by your treatment team.   Patients discharged the day of surgery will not be allowed to drive home, and someone needs to stay with them for 24 hours.  SURGICAL WAITING ROOM VISITATION Patients having surgery or a procedure may have two support people in the waiting room. These visitors may be switched out with other visitors if needed. Children under the age of 81 must have an adult accompany them who is not the patient. If the patient needs to stay at the hospital during part of their recovery, the visitor guidelines for inpatient rooms apply.  Please refer to the [facility] website for  the visitor guidelines for Inpatients (after your surgery is over and you are in a regular room).    Special instructions:   - Preparing For Surgery  Before surgery, you can play an important role. Because skin is not sterile, your skin needs to be as free of germs as possible. You can reduce the number of germs on your skin by washing with CHG (chlorahexidine gluconate) Soap before surgery.  CHG is an antiseptic cleaner which kills germs and bonds with the skin to continue killing germs even after washing.    Oral Hygiene is also important to reduce your risk of infection.  Remember - BRUSH YOUR TEETH THE MORNING OF SURGERY WITH YOUR REGULAR TOOTHPASTE  Please do not use if you have an allergy to CHG or antibacterial soaps. If your skin becomes reddened/irritated stop using the CHG.  Do not shave (including legs and underarms) for at least 48 hours prior to first CHG shower. It is OK to shave your face.  Please follow these instructions carefully.   Shower the NIGHT BEFORE SURGERY and the MORNING OF SURGERY  If you chose to wash your hair, wash your hair first as usual with your normal shampoo.  After you shampoo, rinse your hair and body thoroughly to remove the shampoo.  Use CHG Soap as you would any other liquid soap. You can apply CHG directly to the skin and wash gently with a scrungie or a clean washcloth.   Apply  the CHG Soap to your body ONLY FROM THE NECK DOWN.  Do not use on open wounds or open sores. Avoid contact with your eyes, ears, mouth and genitals (private parts). Wash Face and genitals (private parts)  with your normal soap.   Wash thoroughly, paying special attention to the area where your surgery will be performed.  Thoroughly rinse your body with warm water from the neck down.  DO NOT shower/wash with your normal soap after using and rinsing off the CHG Soap.  Pat yourself dry with a CLEAN TOWEL.  Wear CLEAN PAJAMAS to bed the night before  surgery  Place CLEAN SHEETS on your bed the night before your surgery  DO NOT SLEEP WITH PETS.   Day of Surgery: Take a shower with CHG soap. Do not wear jewelry Do not wear lotions, powders, colognes, or deodorant. Men may shave face and neck. Do not bring valuables to the hospital.  [facility] is not responsible for any belongings or valuables. Wear Clean/Comfortable clothing the morning of surgery Remember to brush your teeth WITH YOUR REGULAR TOOTHPASTE.   Please read over the following fact sheets that you were given.    If you received a COVID test during your pre-op visit  it is requested that you wear a mask when out in public, stay away from anyone that may not be feeling well and notify your surgeon if you develop symptoms. If you have been in contact with anyone that has tested positive in the last 10 days please notify you surgeon.

## 2021-09-14 ENCOUNTER — Ambulatory Visit (HOSPITAL_COMMUNITY)
Admission: RE | Admit: 2021-09-14 | Discharge: 2021-09-14 | Disposition: A | Discharge status: Home / Self Care | Payer: PPO | Source: Ambulatory Visit | Attending: Surgery | Admitting: Surgery

## 2021-09-14 ENCOUNTER — Encounter (HOSPITAL_COMMUNITY)
Admission: RE | Admit: 2021-09-14 | Discharge: 2021-09-14 | Disposition: A | Discharge status: Home / Self Care | Payer: PPO | Source: Ambulatory Visit | Attending: Surgery | Admitting: Surgery

## 2021-09-14 ENCOUNTER — Ambulatory Visit (HOSPITAL_BASED_OUTPATIENT_CLINIC_OR_DEPARTMENT_OTHER)
Admission: RE | Admit: 2021-09-14 | Discharge: 2021-09-14 | Disposition: A | Discharge status: Home / Self Care | Payer: PPO | Source: Ambulatory Visit | Attending: Surgery | Admitting: Surgery

## 2021-09-14 ENCOUNTER — Other Ambulatory Visit: Payer: Self-pay

## 2021-09-14 ENCOUNTER — Encounter (HOSPITAL_COMMUNITY): Payer: Self-pay

## 2021-09-14 VITALS — BP 127/84 | HR 51 | Temp 97.7°F | Resp 18 | Ht 66.0 in | Wt 279.4 lb

## 2021-09-14 DIAGNOSIS — I480 Paroxysmal atrial fibrillation: Secondary | ICD-10-CM | POA: Diagnosis not present

## 2021-09-14 DIAGNOSIS — I509 Heart failure, unspecified: Secondary | ICD-10-CM | POA: Diagnosis not present

## 2021-09-14 DIAGNOSIS — Z20822 Contact with and (suspected) exposure to covid-19: Secondary | ICD-10-CM | POA: Diagnosis not present

## 2021-09-14 DIAGNOSIS — I350 Nonrheumatic aortic (valve) stenosis: Secondary | ICD-10-CM | POA: Diagnosis present

## 2021-09-14 DIAGNOSIS — Z801 Family history of malignant neoplasm of trachea, bronchus and lung: Secondary | ICD-10-CM | POA: Diagnosis not present

## 2021-09-14 DIAGNOSIS — I2510 Atherosclerotic heart disease of native coronary artery without angina pectoris: Secondary | ICD-10-CM | POA: Diagnosis present

## 2021-09-14 DIAGNOSIS — I4891 Unspecified atrial fibrillation: Secondary | ICD-10-CM | POA: Diagnosis not present

## 2021-09-14 DIAGNOSIS — J811 Chronic pulmonary edema: Secondary | ICD-10-CM | POA: Diagnosis not present

## 2021-09-14 DIAGNOSIS — K219 Gastro-esophageal reflux disease without esophagitis: Secondary | ICD-10-CM | POA: Diagnosis present

## 2021-09-14 DIAGNOSIS — Z4682 Encounter for fitting and adjustment of non-vascular catheter: Secondary | ICD-10-CM | POA: Diagnosis not present

## 2021-09-14 DIAGNOSIS — I719 Aortic aneurysm of unspecified site, without rupture: Secondary | ICD-10-CM | POA: Diagnosis not present

## 2021-09-14 DIAGNOSIS — I7121 Aneurysm of the ascending aorta, without rupture: Secondary | ICD-10-CM | POA: Insufficient documentation

## 2021-09-14 DIAGNOSIS — Z885 Allergy status to narcotic agent status: Secondary | ICD-10-CM | POA: Diagnosis not present

## 2021-09-14 DIAGNOSIS — I38 Endocarditis, valve unspecified: Secondary | ICD-10-CM | POA: Diagnosis not present

## 2021-09-14 DIAGNOSIS — G4733 Obstructive sleep apnea (adult) (pediatric): Secondary | ICD-10-CM | POA: Diagnosis present

## 2021-09-14 DIAGNOSIS — J90 Pleural effusion, not elsewhere classified: Secondary | ICD-10-CM | POA: Diagnosis not present

## 2021-09-14 DIAGNOSIS — Z981 Arthrodesis status: Secondary | ICD-10-CM | POA: Diagnosis not present

## 2021-09-14 DIAGNOSIS — Z01818 Encounter for other preprocedural examination: Secondary | ICD-10-CM | POA: Insufficient documentation

## 2021-09-14 DIAGNOSIS — Z800 Family history of malignant neoplasm of digestive organs: Secondary | ICD-10-CM | POA: Diagnosis not present

## 2021-09-14 DIAGNOSIS — I25119 Atherosclerotic heart disease of native coronary artery with unspecified angina pectoris: Secondary | ICD-10-CM | POA: Diagnosis not present

## 2021-09-14 DIAGNOSIS — I110 Hypertensive heart disease with heart failure: Secondary | ICD-10-CM | POA: Diagnosis not present

## 2021-09-14 DIAGNOSIS — J984 Other disorders of lung: Secondary | ICD-10-CM | POA: Diagnosis not present

## 2021-09-14 DIAGNOSIS — J9811 Atelectasis: Secondary | ICD-10-CM | POA: Diagnosis not present

## 2021-09-14 DIAGNOSIS — D62 Acute posthemorrhagic anemia: Secondary | ICD-10-CM | POA: Diagnosis not present

## 2021-09-14 DIAGNOSIS — I517 Cardiomegaly: Secondary | ICD-10-CM | POA: Diagnosis not present

## 2021-09-14 DIAGNOSIS — Z6841 Body Mass Index (BMI) 40.0 and over, adult: Secondary | ICD-10-CM | POA: Diagnosis not present

## 2021-09-14 DIAGNOSIS — I959 Hypotension, unspecified: Secondary | ICD-10-CM | POA: Diagnosis not present

## 2021-09-14 DIAGNOSIS — Z79899 Other long term (current) drug therapy: Secondary | ICD-10-CM | POA: Diagnosis not present

## 2021-09-14 DIAGNOSIS — M1990 Unspecified osteoarthritis, unspecified site: Secondary | ICD-10-CM | POA: Diagnosis present

## 2021-09-14 DIAGNOSIS — E785 Hyperlipidemia, unspecified: Secondary | ICD-10-CM | POA: Diagnosis not present

## 2021-09-14 DIAGNOSIS — I5022 Chronic systolic (congestive) heart failure: Secondary | ICD-10-CM | POA: Diagnosis not present

## 2021-09-14 DIAGNOSIS — Z952 Presence of prosthetic heart valve: Secondary | ICD-10-CM | POA: Diagnosis present

## 2021-09-14 DIAGNOSIS — F419 Anxiety disorder, unspecified: Secondary | ICD-10-CM | POA: Diagnosis present

## 2021-09-14 DIAGNOSIS — Z87891 Personal history of nicotine dependence: Secondary | ICD-10-CM | POA: Diagnosis not present

## 2021-09-14 DIAGNOSIS — I5032 Chronic diastolic (congestive) heart failure: Secondary | ICD-10-CM | POA: Diagnosis not present

## 2021-09-14 DIAGNOSIS — G2581 Restless legs syndrome: Secondary | ICD-10-CM | POA: Diagnosis not present

## 2021-09-14 DIAGNOSIS — Z7982 Long term (current) use of aspirin: Secondary | ICD-10-CM | POA: Diagnosis not present

## 2021-09-14 DIAGNOSIS — I252 Old myocardial infarction: Secondary | ICD-10-CM | POA: Diagnosis not present

## 2021-09-14 HISTORY — DX: Other complications of anesthesia, initial encounter: T88.59XA

## 2021-09-14 LAB — BLOOD GAS, ARTERIAL
Acid-Base Excess: 3.9 mmol/L — ABNORMAL HIGH (ref 0.0–2.0)
Bicarbonate: 28.5 mmol/L — ABNORMAL HIGH (ref 20.0–28.0)
Drawn by: 60286
O2 Saturation: 97.4 %
Patient temperature: 37
pCO2 arterial: 42 mmHg (ref 32–48)
pH, Arterial: 7.44 (ref 7.35–7.45)
pO2, Arterial: 92 mmHg (ref 83–108)

## 2021-09-14 LAB — CBC
HCT: 39.3 % (ref 39.0–52.0)
Hemoglobin: 12.7 g/dL — ABNORMAL LOW (ref 13.0–17.0)
MCH: 29.5 pg (ref 26.0–34.0)
MCHC: 32.3 g/dL (ref 30.0–36.0)
MCV: 91.4 fL (ref 80.0–100.0)
Platelets: 115 10*3/uL — ABNORMAL LOW (ref 150–400)
RBC: 4.3 MIL/uL (ref 4.22–5.81)
RDW: 13.8 % (ref 11.5–15.5)
WBC: 7.8 10*3/uL (ref 4.0–10.5)
nRBC: 0 % (ref 0.0–0.2)

## 2021-09-14 LAB — URINALYSIS, ROUTINE W REFLEX MICROSCOPIC
Bilirubin Urine: NEGATIVE
Glucose, UA: NEGATIVE mg/dL
Ketones, ur: NEGATIVE mg/dL
Leukocytes,Ua: NEGATIVE
Nitrite: NEGATIVE
Protein, ur: NEGATIVE mg/dL
Specific Gravity, Urine: 1.008 (ref 1.005–1.030)
pH: 6 (ref 5.0–8.0)

## 2021-09-14 LAB — COMPREHENSIVE METABOLIC PANEL
ALT: 23 U/L (ref 0–44)
AST: <5 U/L — ABNORMAL LOW (ref 15–41)
Albumin: 3.7 g/dL (ref 3.5–5.0)
Alkaline Phosphatase: 82 U/L (ref 38–126)
Anion gap: 9 (ref 5–15)
BUN: 19 mg/dL (ref 8–23)
CO2: 24 mmol/L (ref 22–32)
Calcium: 8.8 mg/dL — ABNORMAL LOW (ref 8.9–10.3)
Chloride: 103 mmol/L (ref 98–111)
Creatinine, Ser: 1.01 mg/dL (ref 0.61–1.24)
GFR, Estimated: >60 mL/min (ref >60)
Glucose, Bld: 114 mg/dL — ABNORMAL HIGH (ref 70–99)
Potassium: 4.3 mmol/L (ref 3.5–5.1)
Sodium: 136 mmol/L (ref 135–145)
Total Bilirubin: <0.1 mg/dL — ABNORMAL LOW (ref 0.3–1.2)
Total Protein: 6.7 g/dL (ref 6.5–8.1)

## 2021-09-14 LAB — HEMOGLOBIN A1C
Hgb A1c MFr Bld: 5.9 % — ABNORMAL HIGH (ref 4.8–5.6)
Mean Plasma Glucose: 122.63 mg/dL

## 2021-09-14 LAB — SARS CORONAVIRUS 2 (TAT 6-24 HRS): SARS Coronavirus 2: NEGATIVE

## 2021-09-14 LAB — PROTIME-INR
INR: 0.9 (ref 0.8–1.2)
Prothrombin Time: 12.5 seconds (ref 11.4–15.2)

## 2021-09-14 LAB — SURGICAL PCR SCREEN
MRSA, PCR: NEGATIVE
Staphylococcus aureus: POSITIVE — AB

## 2021-09-14 LAB — APTT: aPTT: 23 seconds — ABNORMAL LOW (ref 24–36)

## 2021-09-14 NOTE — Progress Notes (Signed)
PCP - Dr. [name] Cardiologist - Dr. [name] Urologist-Dr. Alexis Frock  PPM/ICD - n/a  Chest x-ray - 09/14/21 EKG - 09/14/21 Stress Test - 2017 ECHO - 07/01/21 Cardiac Cath -07/08/21   Sleep Study - OSA+ CPAP - uses nightly  Blood Thinner Instructions: n/a Aspirin Instructions: n/a  COVID TEST- 09/14/21; done in PAT  Anesthesia review: Yes, cardiac hx.   Patient denies shortness of breath, fever, cough and chest pain at PAT appointment   All instructions explained to the patient, with a verbal understanding of the material. Patient agrees to go over the instructions while at home for a better understanding. Patient also instructed to self quarantine after being tested for COVID-19. The opportunity to ask questions was provided.

## 2021-09-15 DIAGNOSIS — G4733 Obstructive sleep apnea (adult) (pediatric): Secondary | ICD-10-CM | POA: Diagnosis not present

## 2021-09-15 DIAGNOSIS — F419 Anxiety disorder, unspecified: Secondary | ICD-10-CM | POA: Diagnosis not present

## 2021-09-15 MED ORDER — TRANEXAMIC ACID (OHS) PUMP PRIME SOLUTION
2.0000 mg/kg | INTRAVENOUS | Status: DC
  Filled 2021-09-15: qty 2.53

## 2021-09-15 MED ORDER — NOREPINEPHRINE 4 MG/250ML-% IV SOLN
0.0000 ug/min | INTRAVENOUS | Status: DC
  Filled 2021-09-15: qty 250

## 2021-09-15 MED ORDER — DEXMEDETOMIDINE HCL IN NACL 400 MCG/100ML IV SOLN
0.1000 ug/kg/h | INTRAVENOUS | Status: AC
  Administered 2021-09-16: .7 ug/kg/h via INTRAVENOUS
  Administered 2021-09-16: .4 ug/kg/h via INTRAVENOUS
  Filled 2021-09-15: qty 100

## 2021-09-15 MED ORDER — MILRINONE LACTATE IN DEXTROSE 20-5 MG/100ML-% IV SOLN
0.3000 ug/kg/min | INTRAVENOUS | Status: DC
  Filled 2021-09-15: qty 100

## 2021-09-15 MED ORDER — CEFAZOLIN SODIUM-DEXTROSE 2-4 GM/100ML-% IV SOLN
2.0000 g | INTRAVENOUS | Status: AC
  Administered 2021-09-16: 3 g via INTRAVENOUS
  Filled 2021-09-15: qty 100

## 2021-09-15 MED ORDER — MANNITOL 20 % IV SOLN
INTRAVENOUS | Status: DC
  Filled 2021-09-15: qty 13

## 2021-09-15 MED ORDER — PLASMA-LYTE A IV SOLN
INTRAVENOUS | Status: DC
  Filled 2021-09-15: qty 2.5

## 2021-09-15 MED ORDER — TRANEXAMIC ACID (OHS) BOLUS VIA INFUSION
15.0000 mg/kg | INTRAVENOUS | Status: AC
  Administered 2021-09-16: 1900.5 mg via INTRAVENOUS
  Filled 2021-09-15: qty 1901

## 2021-09-15 MED ORDER — HEPARIN 30,000 UNITS/1000 ML (OHS) CELLSAVER SOLUTION
Status: DC
  Filled 2021-09-15: qty 1000

## 2021-09-15 MED ORDER — NITROGLYCERIN IN D5W 200-5 MCG/ML-% IV SOLN
2.0000 ug/min | INTRAVENOUS | Status: DC
  Filled 2021-09-15: qty 250

## 2021-09-15 MED ORDER — INSULIN REGULAR(HUMAN) IN NACL 100-0.9 UT/100ML-% IV SOLN
INTRAVENOUS | Status: AC
  Administered 2021-09-16: 2.2 [IU]/h via INTRAVENOUS
  Filled 2021-09-15: qty 100

## 2021-09-15 MED ORDER — POTASSIUM CHLORIDE 2 MEQ/ML IV SOLN
80.0000 meq | INTRAVENOUS | Status: DC
  Filled 2021-09-15: qty 40

## 2021-09-15 MED ORDER — EPINEPHRINE HCL 5 MG/250ML IV SOLN IN NS
0.0000 ug/min | INTRAVENOUS | Status: DC
  Filled 2021-09-15: qty 250

## 2021-09-15 MED ORDER — VANCOMYCIN HCL 1500 MG/300ML IV SOLN
1500.0000 mg | INTRAVENOUS | Status: AC
  Administered 2021-09-16: 1500 mg via INTRAVENOUS
  Filled 2021-09-15: qty 300

## 2021-09-15 MED ORDER — CEFAZOLIN IN SODIUM CHLORIDE 3-0.9 GM/100ML-% IV SOLN
3.0000 g | INTRAVENOUS | Status: AC
  Administered 2021-09-16: 3 g via INTRAVENOUS
  Filled 2021-09-15: qty 100

## 2021-09-15 MED ORDER — TRANEXAMIC ACID 1000 MG/10ML IV SOLN
1.5000 mg/kg/h | INTRAVENOUS | Status: AC
  Administered 2021-09-16 (×2): 1.5 mg/kg/h via INTRAVENOUS
  Filled 2021-09-15: qty 25

## 2021-09-15 MED ORDER — PHENYLEPHRINE HCL-NACL 20-0.9 MG/250ML-% IV SOLN
30.0000 ug/min | INTRAVENOUS | Status: AC
  Administered 2021-09-16: 20 ug/min via INTRAVENOUS
  Filled 2021-09-15: qty 250

## 2021-09-15 NOTE — Anesthesia Preprocedure Evaluation (Signed)
Anesthesia Evaluation  Patient identified by MRN, date of birth, ID band Patient awake    Reviewed: Allergy & Precautions, NPO status , Patient's Chart, lab work & pertinent test results, reviewed documented beta blocker date and time   History of Anesthesia Complications (+) PROLONGED EMERGENCE and history of anesthetic complications  Airway Mallampati: II  TM Distance: >3 FB Neck ROM: Limited    Dental  (+) Dental Advisory Given, Missing, Edentulous Lower   Pulmonary sleep apnea , former smoker,    Pulmonary exam normal breath sounds clear to auscultation       Cardiovascular hypertension, Pt. on medications and Pt. on home beta blockers + angina + CAD, + Past MI, + Cardiac Stents, + Peripheral Vascular Disease and +CHF  Normal cardiovascular exam+ Valvular Problems/Murmurs AS  Rhythm:Regular Rate:Normal  Echo 07/01/21: 1. Left ventricular ejection fraction, by estimation, is 60 to 65%. The  left ventricle has normal function. The left ventricle has no regional  wall motion abnormalities. There is mild concentric left ventricular  hypertrophy. Left ventricular diastolic  parameters are consistent with Grade I diastolic dysfunction (impaired  relaxation).  2. Right ventricular systolic function is normal. The right ventricular  size is normal. Tricuspid regurgitation signal is inadequate for assessing  PA pressure.  3. The mitral valve is normal in structure. No evidence of mitral valve  regurgitation. No evidence of mitral stenosis.  4. The aortic valve is bicuspid. There is severe calcifcation of the  aortic valve. Aortic valve regurgitation is not visualized. Severe aortic  valve stenosis. Aortic valve area, by VTI measures 0.91 cm. Aortic valve  mean gradient measures 48.0 mmHg.  5. Aortic dilatation noted. There is moderate dilatation of the ascending  aorta, measuring 45 mm.    Neuro/Psych PSYCHIATRIC DISORDERS  Anxiety Depression negative neurological ROS     GI/Hepatic Neg liver ROS, GERD  ,  Endo/Other  Morbid obesity  Renal/GU negative Renal ROS     Musculoskeletal  (+) Arthritis ,   Abdominal   Peds  Hematology  (+) Blood dyscrasia (Thrombocytopenia), anemia ,   Anesthesia Other Findings   Reproductive/Obstetrics                            Anesthesia Physical Anesthesia Plan  ASA: 4  Anesthesia Plan: General   Post-op Pain Management:    Induction: Intravenous  PONV Risk Score and Plan: 2 and Midazolam and Treatment may vary due to age or medical condition  Airway Management Planned: Oral ETT  Additional Equipment: Arterial line, CVP, PA Cath, TEE and Ultrasound Guidance Line Placement  Intra-op Plan:   Post-operative Plan: Post-operative intubation/ventilation  Informed Consent: I have reviewed the patients History and Physical, chart, labs and discussed the procedure including the risks, benefits and alternatives for the proposed anesthesia with the patient or authorized representative who has indicated his/her understanding and acceptance.     Dental advisory given  Plan Discussed with: CRNA  Anesthesia Plan Comments:        Anesthesia Quick Evaluation

## 2021-09-15 NOTE — H&P (Signed)
[address]       [place] [address]             [phone]      Cardiothoracic Surgery Admission History and Physical   PCP is Laurey Morale, MD Referring Provider is [name], MD Primary Cardiologist is [name], MD   Reason for admission: Severe bicuspid aortic valve stenosis and ascending aortic aneurysm   HPI:   The patient is a 66 year old gentleman with a history of hypertension, hyperlipidemia, coronary artery disease status post PCI of the RCA in 2012 with DES,  bladder cancer that has been treated with TURBT and chemotherapy with BCG, and moderate aortic stenosis.  His most recent echo on 07/01/2021 showed an increase in the mean gradient to 48 mmHg with a peak gradient of 76 mmHg.  Valve area by VTI was 0.91 cm.  The valve appeared bicuspid.  The ascending aorta was measured at 4.5 cm.  Left ventricular ejection fraction was 60 to 65% with mild concentric LVH and grade 1 diastolic dysfunction.  Cardiac catheterization on 07/08/2021 showed patent stents in the RCA and otherwise no significant disease.   He lives with his wife.  He continues to have exertional shortness of breath and fatigue.  He denies any chest pain or pressure.  He denies dizziness and syncope.  He has had no orthopnea or PND.  He denies peripheral edema.  His activity has primarily been limited by degenerative arthritis in his knees.       Past Medical History:  Diagnosis Date   Anxiety      takes Valium as needed   Aortic stenosis, moderate     Arthritis     Ascending aortic aneurysm (HCC)     CAD (coronary artery disease)      a. s/p PCI of the RCA 8/12 with DES by Dr Burt Knack, preserved EF. b. LHC/RHC (2/16) with mean RA 12, PA 32/15, mean PCWP 18, CI 3.47; patent mid and distal RCA stents, 50-60% proximal stenosis small PDA.      Carotid stenosis      a. Carotid US (05/2013):  Bilateral 1-39% ICA; L thyroid nodule (prior hx of aspiration).   Chronic diastolic CHF  (congestive heart failure) (HCC)     Depression     Essential hypertension     GERD (gastroesophageal reflux disease)      if needed will take OTC meds    Heart murmur     History of colonic polyps      hyperplastic   Hyperlipidemia     Joint pain     Lesion of bladder     Myocardial infarction ([place]) 2012   Obesity (BMI 30-39.9) 02/29/2016   Restless leg     Sleep apnea      uses cpap   Tubular adenoma of colon     Vertigo      takes Meclizine as needed           Past Surgical History:  Procedure Laterality Date   CATARACT EXTRACTION    4 YRS AGO    BOTH EYES   CHOLECYSTECTOMY   07/21/2011    Procedure: LAPAROSCOPIC CHOLECYSTECTOMY WITH INTRAOPERATIVE CHOLANGIOGRAM;  Surgeon: Shann Medal, MD;  Location: [facility];  Service: General;  Laterality: N/A;   CORONARY ANGIOPLASTY   2012    2 stents   coronary stenting        s/p PCI of the RCA by Dr  Cooper 8/12 with 2 promus stents   CYSTOSCOPY W/ RETROGRADES Bilateral 12/13/2019    Procedure: CYSTOSCOPY WITH RETROGRADE PYELOGRAM;  Surgeon: Alexis Frock, MD;  Location: [facility];  Service: Urology;  Laterality: Bilateral;   CYSTOSCOPY W/ RETROGRADES Bilateral 10/14/2020    Procedure: CYSTOSCOPY WITH RETROGRADE PYELOGRAM;  Surgeon: Alexis Frock, MD;  Location: [facility];  Service: Urology;  Laterality: Bilateral;   CYSTOSCOPY W/ RETROGRADES Bilateral 12/16/2020    Procedure: CYSTOSCOPY WITH RETROGRADE PYELOGRAM;  Surgeon: Alexis Frock, MD;  Location: [facility];  Service: Urology;  Laterality: Bilateral;   LEFT AND RIGHT HEART CATHETERIZATION WITH CORONARY ANGIOGRAM N/A 06/23/2014    Procedure: LEFT AND RIGHT HEART CATHETERIZATION WITH CORONARY ANGIOGRAM;  Surgeon: Larey Dresser, MD;  Location: [facility] CATH LAB;  Service: Cardiovascular;  Laterality: N/A;   NECK SURGERY   03/23/09    per Dr. Lorin Mercy, cervical fusion    right elbow surgery       RIGHT HEART CATH AND CORONARY  ANGIOGRAPHY N/A 07/08/2021    Procedure: RIGHT HEART CATH AND CORONARY ANGIOGRAPHY;  Surgeon: Larey Dresser, MD;  Location: [facility];  Service: Cardiovascular;  Laterality: N/A;   solonscopy   05/23/08    per Dr. Wynona Luna hemorrhoids only, repeat in 5 years   TEE WITHOUT CARDIOVERSION N/A 06/23/2014    Procedure: TRANSESOPHAGEAL ECHOCARDIOGRAM (TEE);  Surgeon: Larey Dresser, MD;  Location: [facility];  Service: Cardiovascular;  Laterality: N/A;   TEE WITHOUT CARDIOVERSION N/A 01/21/2016    Procedure: TRANSESOPHAGEAL ECHOCARDIOGRAM (TEE);  Surgeon: Larey Dresser, MD;  Location: [facility];  Service: Cardiovascular;  Laterality: N/A;   TONSILLECTOMY       TRANSURETHRAL RESECTION OF BLADDER TUMOR N/A 10/21/2019    Procedure: TRANSURETHRAL RESECTION OF BLADDER TUMOR (TURBT);  Surgeon: Kathie Rhodes, MD;  Location: [facility];  Service: Urology;  Laterality: N/A;   TRANSURETHRAL RESECTION OF BLADDER TUMOR N/A 12/13/2019    Procedure: TRANSURETHRAL RESECTION OF BLADDER TUMOR (TURBT);  Surgeon: Alexis Frock, MD;  Location: [facility];  Service: Urology;  Laterality: N/A;  1 HR   TRANSURETHRAL RESECTION OF BLADDER TUMOR N/A 10/14/2020    Procedure: TRANSURETHRAL RESECTION OF BLADDER TUMOR (TURBT);  Surgeon: Alexis Frock, MD;  Location: [facility];  Service: Urology;  Laterality: N/A;   TRANSURETHRAL RESECTION OF BLADDER TUMOR N/A 12/16/2020    Procedure: RESTAGING TRANSURETHRAL RESECTION OF BLADDER TUMOR (TURBT);  Surgeon: Alexis Frock, MD;  Location: [facility];  Service: Urology;  Laterality: N/A;           Family History  Problem Relation Age of Onset   Lung cancer Mother          lung   Esophageal cancer Cousin     Colon cancer Neg Hx     Rectal cancer Neg Hx     Stomach cancer Neg Hx        Social History         Socioeconomic History   Marital status: Married      Spouse name: Not on file    Number of children: 4   Years of education: Not on file   Highest education level: Not on file  Occupational History   Occupation: Airline pilot      Employer: [employer] systems  Tobacco Use   Smoking status: Former      Packs/day: 2.00      Years: 40.00  Pack years: 80.00      Types: Cigarettes      Quit date: 2012      Years since quitting: 11.3   Smokeless tobacco: Never  Vaping Use   Vaping Use: Never used  Substance and Sexual Activity   Alcohol use: No      Alcohol/week: 0.0 standard drinks   Drug use: No   Sexual activity: Not on file  Other Topics Concern   Not on file  Social History Narrative   Not on file    Social Determinants of Health       Financial Resource Strain: Low Risk    Difficulty of Paying Living Expenses: Not hard at all  Food Insecurity: No Food Insecurity   Worried About Charity fundraiser in the Last Year: Never true   [place] in the Last Year: Never true  Transportation Needs: No Transportation Needs   Lack of Transportation (Medical): No   Lack of Transportation (Non-Medical): No  Physical Activity: Insufficiently Active   Days of Exercise per Week: 3 days   Minutes of Exercise per Session: 30 min  Stress: No Stress Concern Present   Feeling of Stress : Not at all  Social Connections: Moderately Isolated   Frequency of Communication with Friends and Family: More than three times a week   Frequency of Social Gatherings with Friends and Family: More than three times a week   Attends Religious Services: Never   Marine scientist or Organizations: No   Attends Archivist Meetings: Never   Marital Status: Married  Human resources officer Violence: Not At Risk   Fear of Current or Ex-Partner: No   Emotionally Abused: No   Physically Abused: No   Sexually Abused: No             Prior to Admission medications   Medication Sig Start Date End Date Taking? Authorizing Provider  acetaminophen (TYLENOL)  325 MG tablet Take 650 mg by mouth every 6 (six) hours as needed for moderate pain.     Yes [provider]  aspirin 81 MG tablet Take 81 mg by mouth every morning.     Yes [provider]  carvedilol (COREG) 6.25 MG tablet TAKE 1 TABLET BY MOUTH TWICE A DAY 08/16/21   Yes Larey Dresser, MD  diazepam (VALIUM) 5 MG tablet TAKE 1 TABLET (5 MG TOTAL) BY MOUTH EVERY 12 (TWELVE) HOURS AS NEEDED FOR ANXIETY. 11/20/19   Yes Laurey Morale, MD  furosemide (LASIX) 40 MG tablet TAKE 1 TABLET (40 MG TOTAL) BY MOUTH DAILY AND 1/2 TABLETS (20 MG TOTAL) EVERY EVENING. 07/14/21   Yes Larey Dresser, MD  ipratropium (ATROVENT) 0.03 % nasal spray Place 2 sprays into both nostrils every 12 (twelve) hours as needed for rhinitis.     Yes [provider]  losartan (COZAAR) 50 MG tablet TAKE 1 TABLET (50 MG TOTAL) BY MOUTH IN THE MORNING AND AT BEDTIME. 02/17/21   Yes Larey Dresser, MD  MAGNESIUM PO Take 500 mg by mouth daily.     Yes [provider]  meclizine (ANTIVERT) 25 MG tablet Take 1 tablet (25 mg total) by mouth 3 (three) times daily as needed for dizziness. 10/21/16   Yes Laurey Morale, MD  nitroGLYCERIN (NITROSTAT) 0.4 MG SL tablet Place 1 tablet (0.4 mg total) under the tongue every 5 (five) minutes as needed. For chest pain. 11/03/17 12/05/25 Yes Larey Dresser, MD  rosuvastatin (CRESTOR) 20 MG tablet TAKE 1 TABLET BY MOUTH EVERYDAY AT BEDTIME 06/15/21   Yes Larey Dresser, MD  tamsulosin (FLOMAX) 0.4 MG CAPS capsule Take 0.4 mg by mouth daily.     Yes [provider]  venlafaxine XR (EFFEXOR-XR) 150 MG 24 hr capsule TAKE 1 CAPSULE BY MOUTH DAILY WITH BREAKFAST. 08/16/21   Yes Laurey Morale, MD            Current Outpatient Medications  Medication Sig Dispense Refill   acetaminophen (TYLENOL) 325 MG tablet Take 650 mg by mouth every 6 (six) hours as needed for moderate pain.       aspirin 81 MG tablet Take 81 mg by mouth every morning.       carvedilol  (COREG) 6.25 MG tablet TAKE 1 TABLET BY MOUTH TWICE A DAY 180 tablet 3   diazepam (VALIUM) 5 MG tablet TAKE 1 TABLET (5 MG TOTAL) BY MOUTH EVERY 12 (TWELVE) HOURS AS NEEDED FOR ANXIETY. 60 tablet 5   furosemide (LASIX) 40 MG tablet TAKE 1 TABLET (40 MG TOTAL) BY MOUTH DAILY AND 1/2 TABLETS (20 MG TOTAL) EVERY EVENING. 45 tablet 3   ipratropium (ATROVENT) 0.03 % nasal spray Place 2 sprays into both nostrils every 12 (twelve) hours as needed for rhinitis.       losartan (COZAAR) 50 MG tablet TAKE 1 TABLET (50 MG TOTAL) BY MOUTH IN THE MORNING AND AT BEDTIME. 180 tablet 3   MAGNESIUM PO Take 500 mg by mouth daily.       meclizine (ANTIVERT) 25 MG tablet Take 1 tablet (25 mg total) by mouth 3 (three) times daily as needed for dizziness. 30 tablet 0   nitroGLYCERIN (NITROSTAT) 0.4 MG SL tablet Place 1 tablet (0.4 mg total) under the tongue every 5 (five) minutes as needed. For chest pain. 25 tablet 2   rosuvastatin (CRESTOR) 20 MG tablet TAKE 1 TABLET BY MOUTH EVERYDAY AT BEDTIME 90 tablet 3   tamsulosin (FLOMAX) 0.4 MG CAPS capsule Take 0.4 mg by mouth daily.       venlafaxine XR (EFFEXOR-XR) 150 MG 24 hr capsule TAKE 1 CAPSULE BY MOUTH DAILY WITH BREAKFAST. 90 capsule 0    No current facility-administered medications for this visit.           Allergies  Allergen Reactions   Oxycodone        insomnia          Review of Systems:               General:                      normal appetite, + decreased energy, + weight gain since he stopped smoking, no weight loss, no fever             Cardiac:                       no chest pain with exertion, no chest pain at rest, +SOB with mild exertion, no resting SOB, no PND, no orthopnea, no palpitations, no arrhythmia, no atrial fibrillation, no LE edema, no dizzy spells, no syncope             Respiratory:                 + exertional shortness of breath, no home oxygen, no productive cough, no dry cough, no bronchitis, no wheezing, no hemoptysis, no  asthma, no  pain with inspiration or cough, no sleep apnea, no CPAP at night             GI:                               no difficulty swallowing, no reflux, no frequent heartburn, no hiatal hernia, no abdominal pain, no constipation, no diarrhea, no hematochezia, no hematemesis, no melena             GU:                              no dysuria,  no frequency, no urinary tract infection, no hematuria, no enlarged prostate, no kidney stones, no kidney disease             Vascular:                     no pain suggestive of claudication, no pain in feet, no leg cramps, no varicose veins, no DVT, no non-healing foot ulcer             Neuro:                         no stroke, no TIA's, no seizures, no headaches, no temporary blindness one eye,  no slurred speech, no peripheral neuropathy, no chronic pain, no instability of gait, no memory/cognitive dysfunction             Musculoskeletal:         + arthritis - primarily involving the knees, no joint swelling, no myalgias, no difficulty walking, normal mobility              Skin:                            no rash, no itching, no skin infections, no pressure sores or ulcerations             Psych:                         no anxiety, no depression, no nervousness, no unusual recent stress             Eyes:                           no blurry vision, no floaters, no recent vision changes, no glasses or contacts             ENT:                            no hearing loss, no loose or painful teeth, + upper and lower dentures             Hematologic:               no easy bruising, no abnormal bleeding, no clotting disorder, no frequent epistaxis             Endocrine:                   no diabetes, does not check CBG's at home  Physical Exam:               BP 126/74 (BP Location: Left Arm, Patient Position: Sitting)   Pulse 60   Resp 20   Wt 276 lb (125.2 kg)   SpO2 94% Comment: RA  BMI 44.55 kg/m              General:                       Obese gentleman in no distress  well-appearing             HEENT:                       Unremarkable, NCAT, PERLA, EOMI             Neck:                           no JVD, no bruits, no adenopathy  or thyromegaly             Chest:                          clear to auscultation, symmetrical breath sounds, no wheezes, no rhonchi              CV:                              RRR, 3/6 systolic murmur RSB, no diastolic murmur             Abdomen:                    soft, non-tender, no masses              Extremities:                 warm, well-perfused, pulses palpalbe at ankles, no lower extremity edema             Rectal/GU                   Deferred             Neuro:                         Grossly non-focal and symmetrical throughout             Skin:                            Clean and dry, no rashes, no breakdown   Diagnostic Tests:   ECHOCARDIOGRAM REPORT         Patient Name:   Luis [name] Date of Exam: 07/01/2021  Medical Rec #:  [id number]        Height:       66.5 in  Accession #:    [id number]       Weight:       274.8 lb  Date of Birth:  [birth year]        BSA:          2.302 m  Patient Age:    51 years         BP:           129/85  mmHg  Patient Gender: M                HR:           58 bpm.  Exam Location:  Outpatient   Procedure: 2D Echo   Indications:    CHF     History:        Patient has prior history of Echocardiogram examinations,  most                  recent 04/28/2020. CAD, Aortic Valve Disease; Risk                  Factors:Hypertension.     Sonographer:    Jefferey Pica  Referring Phys: [place]     1. Left ventricular ejection fraction, by estimation, is 60 to 65%. The  left ventricle has normal function. The left ventricle has no regional  wall motion abnormalities. There is mild concentric left ventricular  hypertrophy. Left ventricular diastolic  parameters are consistent with Grade I diastolic  dysfunction (impaired  relaxation).   2. Right ventricular systolic function is normal. The right ventricular  size is normal. Tricuspid regurgitation signal is inadequate for assessing  PA pressure.   3. The mitral valve is normal in structure. No evidence of mitral valve  regurgitation. No evidence of mitral stenosis.   4. The aortic valve is bicuspid. There is severe calcifcation of the  aortic valve. Aortic valve regurgitation is not visualized. Severe aortic  valve stenosis. Aortic valve area, by VTI measures 0.91 cm. Aortic valve  mean gradient measures 48.0 mmHg.   5. Aortic dilatation noted. There is moderate dilatation of the ascending  aorta, measuring 45 mm.   FINDINGS   Left Ventricle: Left ventricular ejection fraction, by estimation, is 60  to 65%. The left ventricle has normal function. The left ventricle has no  regional wall motion abnormalities. The left ventricular internal cavity  size was normal in size. There is   mild concentric left ventricular hypertrophy. Left ventricular diastolic  parameters are consistent with Grade I diastolic dysfunction (impaired  relaxation).   Right Ventricle: The right ventricular size is normal. No increase in  right ventricular wall thickness. Right ventricular systolic function is  normal. Tricuspid regurgitation signal is inadequate for assessing PA  pressure.   Left Atrium: Left atrial size was normal in size.   Right Atrium: Right atrial size was normal in size.   Pericardium: There is no evidence of pericardial effusion.   Mitral Valve: The mitral valve is normal in structure. There is mild  calcification of the mitral valve leaflet(s). Mild mitral annular  calcification. No evidence of mitral valve regurgitation. No evidence of  mitral valve stenosis.   Tricuspid Valve: The tricuspid valve is normal in structure. Tricuspid  valve regurgitation is not demonstrated.   Aortic Valve: The aortic valve is bicuspid. There  is severe calcifcation  of the aortic valve. Aortic valve regurgitation is not visualized. Severe  aortic stenosis is present. Aortic valve mean gradient measures 48.0 mmHg.  Aortic valve peak gradient  measures 75.6 mmHg. Aortic valve area, by VTI measures 0.91 cm.   Pulmonic Valve: The pulmonic valve was normal in structure. Pulmonic valve  regurgitation is not visualized.   Aorta: Aortic dilatation noted. There is moderate dilatation of the  ascending aorta, measuring 45 mm.   Venous: The inferior vena cava was not well visualized.   IAS/Shunts: No atrial level  shunt detected by color flow Doppler.      LEFT VENTRICLE  PLAX 2D  LVIDd:         4.40 cm   Diastology  LVIDs:         2.30 cm   LV e' medial:    4.87 cm/s  LV PW:         1.60 cm   LV E/e' medial:  17.0  LV IVS:        1.70 cm   LV e' lateral:   6.24 cm/s  LVOT diam:     2.00 cm   LV E/e' lateral: 13.3  LV SV:         92  LV SV Index:   40  LVOT Area:     3.14 cm      RIGHT VENTRICLE  RV S prime:     16.50 cm/s  TAPSE (M-mode): 2.9 cm   LEFT ATRIUM             Index        RIGHT ATRIUM           Index  LA diam:        4.50 cm 1.96 cm/m   RA Area:     20.30 cm  LA Vol (A2C):   61.3 ml 26.63 ml/m  RA Volume:   56.90 ml  24.72 ml/m  LA Vol (A4C):   67.8 ml 29.46 ml/m  LA Biplane Vol: 67.3 ml 29.24 ml/m   AORTIC VALVE                     PULMONIC VALVE  AV Area (Vmax):    1.03 cm      PV Vmax:       1.24 m/s  AV Area (Vmean):   0.89 cm      PV Peak grad:  6.2 mmHg  AV Area (VTI):     0.91 cm  AV Vmax:           434.75 cm/s  AV Vmean:          295.250 cm/s  AV VTI:            1.009 m  AV Peak Grad:      75.6 mmHg  AV Mean Grad:      48.0 mmHg  LVOT Vmax:         142.00 cm/s  LVOT Vmean:        83.300 cm/s  LVOT VTI:          0.292 m  LVOT/AV VTI ratio: 0.29     AORTA  Ao Root diam: 3.70 cm  Ao Asc diam:  4.40 cm   MITRAL VALVE  MV Area (PHT): 2.50 cm    SHUNTS  MV Decel Time: 303 msec     Systemic VTI:  0.29 m  MV E velocity: 82.90 cm/s  Systemic Diam: 2.00 cm  MV A velocity: 90.70 cm/s  MV E/A ratio:  0.91   Dalton McleanMD  Electronically signed by Franki Monte  Signature Date/Time: 07/01/2021/10:15:24 AM         Final       Physicians   Panel Physicians Referring Physician Case Authorizing Physician  Larey Dresser, MD (Primary)        Procedures   RIGHT HEART CATH AND CORONARY ANGIOGRAPHY    Conclusion       Mid RCA lesion is 40% stenosed.   Previously placed  Prox RCA to Mid RCA stent (unknown type) is  widely patent.   Previously placed Dist RCA stent (unknown type) is  widely patent.   1. Patent RCA stents with nonobstructive mild disease.  2. Normal PCWP, mildly elevated RA pressure.  3. Preserved cardiac output.    Workup for AVR ongoing.    Procedural Details   Technical Details Procedure: Right Heart Cath,  Selective Coronary Angiography  Indication: Aortic stenosis   Procedural Details: The right brachial and radial areas were prepped, draped, and anesthetized with 1% lidocaine. Using modified Seldinger technique and US guidance, a 70F sheath was placed in a right brachial artery.  A Swan-Ganz catheter was used for the right heart catheterization. Standard protocol was followed for recording of right heart pressures and sampling of oxygen saturations. Fick cardiac output was calculated. The right radial artery was entered using modified Seldinger technique and a 20F sheath was placed.  Standard Judkins catheters were used for selective coronary angiography. There were no immediate procedural complications. The patient was transferred to the post catheterization recovery area for further monitoring.     Estimated blood loss <50 mL.   During this procedure medications were administered to achieve and maintain moderate conscious sedation while the patient's heart rate, blood pressure, and oxygen saturation were continuously monitored and I was  present face-to-face 100% of this time.    Medications (Filter: Administrations occurring from 0728 to 0855 on 07/08/21)  important  Continuous medications are totaled by the amount administered until 07/08/21 0855.    fentaNYL (SUBLIMAZE) injection (mcg) Total dose:  25 mcg Date/Time Rate/Dose/Volume Action    07/08/21 0800 25 mcg Given      midazolam (VERSED) injection (mg) Total dose:  1 mg Date/Time Rate/Dose/Volume Action    07/08/21 0800 1 mg Given      lidocaine (PF) (XYLOCAINE) 1 % injection (mL) Total volume:  5 mL Date/Time Rate/Dose/Volume Action    07/08/21 0805 5 mL Given      Radial Cocktail/Verapamil only (mL) Total volume:  10 mL Date/Time Rate/Dose/Volume Action    07/08/21 0822 10 mL Given      heparin sodium (porcine) injection (Units) Total dose:  6,000 Units Date/Time Rate/Dose/Volume Action    07/08/21 0824 6,000 Units Given      iohexol (OMNIPAQUE) 350 MG/ML injection (mL) Total volume:  75 mL Date/Time Rate/Dose/Volume Action    07/08/21 0846 75 mL Given      Sedation Time   Sedation Time Physician-1: 44 minutes 7 seconds Contrast   Medication Name Total Dose  iohexol (OMNIPAQUE) 350 MG/ML injection 75 mL    Radiation/Fluoro   Fluoro time: 11.6 (min) DAP: 77439 (mGycm2) Cumulative Air Kerma: 6270 (mGy) Complications      Complications documented before study signed (07/08/2021  3:50 AM)     No complications were associated with this study.  Documented by Jonne Ply, RT - 07/08/2021  8:51 AM      Coronary Findings   Diagnostic Dominance: Right Right Coronary Artery  Previously placed Prox RCA to Mid RCA stent (unknown type) is widely patent.  Mid RCA lesion is 40% stenosed.  Previously placed Dist RCA stent (unknown type) is widely patent.    Intervention    No interventions have been documented.    Right Heart   Right Heart Pressures RHC Procedural Findings: Hemodynamics (mmHg) RA mean 13 RV 26/14 PA 23/8, mean  9 PCWP mean 11  Oxygen saturations: PA 71% AO 99%  Cardiac Output (Fick) 6.11  Cardiac Index (Fick) 2.65    Coronary Diagrams   Diagnostic Dominance: Right Intervention   Implants      No implant documentation for this case.    Syngo Images    Show images for CARDIAC CATHETERIZATION Images on Long Term Storage    Show images for Shanti, Eichel "[name]" Link to Procedure Log   Procedure Log    Hemo Data   Flowsheet Row Most Recent Value  Fick Cardiac Output 6.11 L/min  Fick Cardiac Output Index 2.65 (L/min)/BSA  RA A Wave 11 mmHg  RA V Wave 12 mmHg  RA Mean 13 mmHg  RV Systolic Pressure 26 mmHg  RV Diastolic Pressure 7 mmHg  RV EDP 14 mmHg  PA Systolic Pressure 23 mmHg  PA Diastolic Pressure 8 mmHg  PA Mean 9 mmHg  PW A Wave 19 mmHg  PW V Wave 12 mmHg  PW Mean 11 mmHg  AO Systolic Pressure 725 mmHg  AO Diastolic Pressure 55 mmHg  AO Mean 77 mmHg  QP/QS 1  TPVR Index 3.78 HRUI  TSVR Index 30.23 HRUI  TPVR/TSVR Ratio 0.13      ADDENDUM REPORT: 07/18/2021 17:23   CLINICAL DATA:  Aortic valve replacement (TAVR), pre-op eval   EXAM: Cardiac TAVR CT   TECHNIQUE: The patient was scanned on a Siemens Force 366 slice scanner. A 120 kV retrospective scan was triggered in the descending thoracic aorta at 111 HU's. Gantry rotation speed was 270 msecs and collimation was .9 mm. The 3D data set was reconstructed in 5% intervals of the R-R cycle. Systolic and diastolic phases were analyzed on a dedicated work station using MPR, MIP and VRT modes. The patient received 158m OMNIPAQUE IOHEXOL 350 MG/ML SOLN of contrast.   FINDINGS: Aortic Valve: Bicuspid aortic valve with apparent fusion of right and left coronary cusps. Severely reduced cusp separation. Severely thickened, severely calcified aortic valve cusps.   AV calcium score: 2899   Virtual Basal Annulus Measurements made at 55% R-R interval due to motion artifact in systole.   Maximum/Minimum  Diameter: 27.3 x 20.3 mm   Perimeter: 73.4 mm   Area:  407 mm2   Trivial LVOT calcifications.   Based on these measurements, the annulus would be suitable for a 23 mm Sapien 3 valve.   Sinus of Valsalva Measurements:   Non-coronary:  35 mm   Right - coronary:  35 mm   Left - coronary:  35 mm   Sinus of Valsalva Height:   Left: 26.8  mm   Right: 20.3 mm   Aorta: Mild aortic atherosclerosis.   Sinotubular Junction:  34 mm   Ascending Thoracic Aorta:  43 mm   Aortic Arch:  29 mm   Descending Thoracic Aorta:  30 mm   Coronary Artery Height above Annulus:   Left Main: 21.9 mm   Right Coronary: 13.8 mm   Coronary Arteries: Stents visualized in RCA. Calcifications noted in left coronary system including left main.   Optimum Fluoroscopic Angle for Delivery: LAO 13 CAU 8   IMPRESSION: 1. Aortic Valve: Bicuspid aortic valve with apparent fusion of right and left coronary cusps. Severely reduced cusp separation. Severely thickened, severely calcified aortic valve cusps.   2. AV calcium score: 2899   3. Annulus area:  407 mm2, suitable for 23 mm Sapien 3 valve   4. Sufficient coronary artery heights from annulus   5. Optimum Fluoroscopic Angle for Delivery: LAO 13 CAU 8     Electronically Signed  By: Cherlynn Kaiser M.D.   On: 07/18/2021 17:23    Addended by Elouise Munroe, MD on 07/18/2021  5:26 PM    Study Result   Narrative & Impression  EXAM: OVER-READ INTERPRETATION  CT CHEST   The following report is an over-read performed by radiologist Dr. Vinnie Langton of [facility], [place] on 07/15/2021. This over-read does not include interpretation of cardiac or coronary anatomy or pathology. The coronary calcium score/coronary CTA interpretation by the cardiologist is attached.   COMPARISON:  Chest CTA 11/23/2020.   FINDINGS: Extracardiac findings will be described separately under dictation for contemporaneously obtained CTA chest,  abdomen and pelvis.   IMPRESSION: Please see separate dictation for contemporaneously obtained CTA chest, abdomen and pelvis 07/15/2021 for full description of relevant extracardiac findings.   Electronically Signed: By: Vinnie Langton M.D. On: 07/15/2021 10:57        Narrative & Impression  CLINICAL DATA:  66 year old male under preoperative evaluation prior to potential transcatheter aortic valve replacement (TAVR) procedure.   EXAM: CT ANGIOGRAPHY CHEST, ABDOMEN AND PELVIS   TECHNIQUE: Non-contrast CT of the chest was initially obtained.   Multidetector CT imaging through the chest, abdomen and pelvis was performed using the standard protocol during bolus administration of intravenous contrast. Multiplanar reconstructed images and MIPs were obtained and reviewed to evaluate the vascular anatomy.   RADIATION DOSE REDUCTION: This exam was performed according to the departmental dose-optimization program which includes automated exposure control, adjustment of the mA and/or kV according to patient size and/or use of iterative reconstruction technique.   CONTRAST:  176m OMNIPAQUE IOHEXOL 350 MG/ML SOLN   COMPARISON:  None.   FINDINGS: CTA CHEST FINDINGS   Cardiovascular: Heart size is borderline enlarged. There is no significant pericardial fluid, thickening or pericardial calcification. There is aortic atherosclerosis, as well as atherosclerosis of the great vessels of the mediastinum and the coronary arteries, including calcified atherosclerotic plaque in the left main, left anterior descending, left circumflex and right coronary arteries. Ascending thoracic aorta is mildly aneurysmal (4.5 cm in diameter). Thickening calcification of the aortic valve.   Mediastinum/Lymph Nodes: No pathologically enlarged mediastinal or hilar lymph nodes. Esophagus is unremarkable in appearance. No axillary lymphadenopathy.   Lungs/Pleura: No suspicious appearing pulmonary  nodules or masses are noted. No acute consolidative airspace disease. No pleural effusions.   Musculoskeletal/Soft Tissues: Orthopedic fixation hardware in the lower cervical spine incidentally noted. There are no aggressive appearing lytic or blastic lesions noted in the visualized portions of the skeleton.   CTA ABDOMEN AND PELVIS FINDINGS   Hepatobiliary: No suspicious cystic or solid hepatic lesions. Severe diffuse low attenuation throughout the hepatic parenchyma, indicative of hepatic steatosis. Liver contour is slightly shrunken and nodular in appearance, suggesting early changes of cirrhosis. No intra or extrahepatic biliary ductal dilatation. Status post cholecystectomy.   Pancreas: No pancreatic mass. No pancreatic ductal dilatation. No pancreatic or peripancreatic fluid collections or inflammatory changes.   Spleen: Unremarkable.   Adrenals/Urinary Tract: 2.5 cm low-attenuation lesion in the anterior aspect of the lower pole of the left kidney, compatible with a simple cyst. Right kidney and bilateral adrenal glands are normal in appearance. No hydroureteronephrosis. Urinary bladder is nearly decompressed, but otherwise unremarkable in appearance.   Stomach/Bowel: The appearance of the stomach is normal. There is no pathologic dilatation of small bowel or colon. The appendix is not confidently identified and may be surgically absent. Regardless, there are no inflammatory changes noted adjacent to the cecum to suggest the presence  of an acute appendicitis at this time.   Vascular/Lymphatic: Aortic atherosclerosis, with vascular findings and measurements pertinent to potential TAVR procedure, as detailed below. No aneurysm or dissection noted in the abdominal or pelvic vasculature. No lymphadenopathy noted in the abdomen or pelvis.   Reproductive: Prostate gland and seminal vesicles are unremarkable in appearance.   Other: Small umbilical hernia containing only  omental fat. No significant volume of ascites. No pneumoperitoneum.   Musculoskeletal: There are no aggressive appearing lytic or blastic lesions noted in the visualized portions of the skeleton.   VASCULAR MEASUREMENTS PERTINENT TO TAVR:   AORTA:   Minimal Aortic Diameter-14 x 14 mm   Severity of Aortic Calcification-mild to moderate   RIGHT PELVIS:   Right Common Iliac Artery -   Minimal Diameter-7.6 x 8.8 mm   Tortuosity-mild   Calcification-mild   Right External Iliac Artery -   Minimal Diameter-7.1 x 7.9 mm   Tortuosity-mild to moderate   Calcification-none   Right Common Femoral Artery -   Minimal Diameter-6.5 x 7.3 mm   Tortuosity-mild   Calcification-mild   LEFT PELVIS:   Left Common Iliac Artery -   Minimal Diameter-10.7 x 10.7 mm   Tortuosity-mild   Calcification-mild   Left External Iliac Artery -   Minimal Diameter-7.2 x 6.3 mm   Tortuosity-mild to moderate   Calcification-none   Left Common Femoral Artery -   Minimal Diameter-7.3 x 7.0 mm   Tortuosity-mild   Calcification-mild   Review of the MIP images confirms the above findings.   IMPRESSION: 1. Vascular findings and measurements pertinent to potential TAVR procedure, as detailed above. 2. Severe thickening calcification of the aortic valve, compatible with reported clinical history of severe aortic stenosis. 3. Mild aneurysmal dilatation of the ascending thoracic aorta (4.5 cm in diameter). Ascending thoracic aortic aneurysm. Recommend semi-annual imaging followup by CTA or MRA and referral to cardiothoracic surgery if not already obtained. This recommendation follows 2010 ACCF/AHA/AATS/ACR/ASA/SCA/SCAI/SIR/STS/SVM Guidelines for the Diagnosis and Management of Patients With Thoracic Aortic Disease. Circulation. 2010; 121: T732-K025. Aortic aneurysm NOS (ICD10-I71.9). 4. Severe hepatic steatosis with subtle morphologic changes in the liver indicative of early  cirrhosis. 5. Additional incidental findings, as above.     Electronically Signed   By: Vinnie Langton M.D.   On: 07/15/2021 12:45          Impression:   This 66 year old gentleman has stage D, severe, symptomatic bicuspid aortic valve stenosis with New York Heart Association class III symptoms of exertional fatigue and shortness of breath consistent with chronic diastolic congestive heart failure.  I have personally reviewed his 2D echocardiogram, cardiac catheterization, and CTA studies.  His echocardiogram shows a heavily calcified bicuspid aortic valve with thickened leaflets and restricted mobility.  The mean gradient was 48 mmHg consistent with severe aortic stenosis.  Cardiac catheterization shows no significant coronary stenoses.  I agree that aortic valve replacement is indicated in this patient for relief of his symptoms and to prevent left ventricular dysfunction.  With a heavily calcified bicuspid aortic valve and relatively young age of 65 and a 4.5 cm ascending aortic aneurysm I think that open surgical aortic valve replacement and replacement of his ascending aorta would be the best option for him.  I discussed the alternative of transcatheter aortic valve replacement and continued medical follow-up of his ascending aortic aneurysm.  I explained the potential difficulties with bicuspid valve morphology and extensive calcification as well as the possibility that his aorta may continue to enlarge and  require a more extensive surgery later.  I also discussed the unknown long-term durability of transcatheter aortic valves and our current recommendation for open surgical valve replacement in low risk patients less than 67 years old.  He is in agreement with proceeding with open surgery.  I discussed the alternatives of mechanical and bioprosthetic valves.  Given his age I recommended a bioprosthetic valve and he is in agreement with that. I discussed the operative procedure with the patient  and family including alternatives, benefits and risks; including but not limited to bleeding, blood transfusion, infection, stroke, myocardial infarction, graft failure, heart block requiring a permanent pacemaker, organ dysfunction, and death.  Luis [name] understands and agrees to proceed.      Plan:   Aortic valve replacement using a bioprosthetic valve and replacement of his ascending aortic aneurysm.      Gaye Pollack, MD

## 2021-09-16 ENCOUNTER — Inpatient Hospital Stay (HOSPITAL_COMMUNITY): Payer: PPO

## 2021-09-16 ENCOUNTER — Inpatient Hospital Stay (HOSPITAL_COMMUNITY): Payer: PPO | Admitting: Physician Assistant

## 2021-09-16 ENCOUNTER — Inpatient Hospital Stay (HOSPITAL_COMMUNITY): Payer: PPO | Admitting: Anesthesiology

## 2021-09-16 ENCOUNTER — Encounter (HOSPITAL_COMMUNITY)
Admission: RE | Disposition: A | Discharge status: Home / Self Care | Payer: Self-pay | Source: Home / Self Care | Attending: Surgery

## 2021-09-16 ENCOUNTER — Other Ambulatory Visit: Payer: Self-pay

## 2021-09-16 ENCOUNTER — Encounter (HOSPITAL_COMMUNITY): Payer: Self-pay | Admitting: Surgery

## 2021-09-16 ENCOUNTER — Inpatient Hospital Stay (HOSPITAL_COMMUNITY)
Admission: RE | Admit: 2021-09-16 | Discharge: 2021-09-23 | DRG: 220 | Disposition: A | Discharge status: Home / Self Care | Payer: PPO | Attending: Surgery | Admitting: Surgery

## 2021-09-16 ENCOUNTER — Telehealth: Payer: Self-pay | Admitting: Pharmacist

## 2021-09-16 DIAGNOSIS — I252 Old myocardial infarction: Secondary | ICD-10-CM

## 2021-09-16 DIAGNOSIS — I110 Hypertensive heart disease with heart failure: Secondary | ICD-10-CM | POA: Diagnosis not present

## 2021-09-16 DIAGNOSIS — I350 Nonrheumatic aortic (valve) stenosis: Secondary | ICD-10-CM

## 2021-09-16 DIAGNOSIS — Z79899 Other long term (current) drug therapy: Secondary | ICD-10-CM

## 2021-09-16 DIAGNOSIS — Z955 Presence of coronary angioplasty implant and graft: Secondary | ICD-10-CM

## 2021-09-16 DIAGNOSIS — Z800 Family history of malignant neoplasm of digestive organs: Secondary | ICD-10-CM | POA: Diagnosis not present

## 2021-09-16 DIAGNOSIS — Z4682 Encounter for fitting and adjustment of non-vascular catheter: Secondary | ICD-10-CM | POA: Diagnosis not present

## 2021-09-16 DIAGNOSIS — E876 Hypokalemia: Secondary | ICD-10-CM | POA: Diagnosis not present

## 2021-09-16 DIAGNOSIS — F419 Anxiety disorder, unspecified: Secondary | ICD-10-CM | POA: Diagnosis present

## 2021-09-16 DIAGNOSIS — K219 Gastro-esophageal reflux disease without esophagitis: Secondary | ICD-10-CM | POA: Diagnosis present

## 2021-09-16 DIAGNOSIS — I5032 Chronic diastolic (congestive) heart failure: Secondary | ICD-10-CM | POA: Diagnosis present

## 2021-09-16 DIAGNOSIS — Z885 Allergy status to narcotic agent status: Secondary | ICD-10-CM | POA: Diagnosis not present

## 2021-09-16 DIAGNOSIS — I5022 Chronic systolic (congestive) heart failure: Secondary | ICD-10-CM | POA: Diagnosis not present

## 2021-09-16 DIAGNOSIS — Z7982 Long term (current) use of aspirin: Secondary | ICD-10-CM

## 2021-09-16 DIAGNOSIS — J90 Pleural effusion, not elsewhere classified: Secondary | ICD-10-CM | POA: Diagnosis not present

## 2021-09-16 DIAGNOSIS — D62 Acute posthemorrhagic anemia: Secondary | ICD-10-CM | POA: Diagnosis not present

## 2021-09-16 DIAGNOSIS — G2581 Restless legs syndrome: Secondary | ICD-10-CM | POA: Diagnosis present

## 2021-09-16 DIAGNOSIS — I7121 Aneurysm of the ascending aorta, without rupture: Secondary | ICD-10-CM | POA: Diagnosis present

## 2021-09-16 DIAGNOSIS — I959 Hypotension, unspecified: Secondary | ICD-10-CM | POA: Diagnosis not present

## 2021-09-16 DIAGNOSIS — I2510 Atherosclerotic heart disease of native coronary artery without angina pectoris: Secondary | ICD-10-CM | POA: Diagnosis present

## 2021-09-16 DIAGNOSIS — I454 Nonspecific intraventricular block: Secondary | ICD-10-CM | POA: Diagnosis present

## 2021-09-16 DIAGNOSIS — G4733 Obstructive sleep apnea (adult) (pediatric): Secondary | ICD-10-CM | POA: Diagnosis present

## 2021-09-16 DIAGNOSIS — Z20822 Contact with and (suspected) exposure to covid-19: Secondary | ICD-10-CM | POA: Diagnosis present

## 2021-09-16 DIAGNOSIS — I517 Cardiomegaly: Secondary | ICD-10-CM | POA: Diagnosis not present

## 2021-09-16 DIAGNOSIS — Z6841 Body Mass Index (BMI) 40.0 and over, adult: Secondary | ICD-10-CM

## 2021-09-16 DIAGNOSIS — J9811 Atelectasis: Secondary | ICD-10-CM | POA: Diagnosis not present

## 2021-09-16 DIAGNOSIS — Z87891 Personal history of nicotine dependence: Secondary | ICD-10-CM | POA: Diagnosis not present

## 2021-09-16 DIAGNOSIS — E785 Hyperlipidemia, unspecified: Secondary | ICD-10-CM | POA: Diagnosis present

## 2021-09-16 DIAGNOSIS — Z952 Presence of prosthetic heart valve: Secondary | ICD-10-CM | POA: Diagnosis present

## 2021-09-16 DIAGNOSIS — M1990 Unspecified osteoarthritis, unspecified site: Secondary | ICD-10-CM | POA: Diagnosis present

## 2021-09-16 DIAGNOSIS — I480 Paroxysmal atrial fibrillation: Secondary | ICD-10-CM | POA: Diagnosis not present

## 2021-09-16 DIAGNOSIS — Z801 Family history of malignant neoplasm of trachea, bronchus and lung: Secondary | ICD-10-CM | POA: Diagnosis not present

## 2021-09-16 DIAGNOSIS — J811 Chronic pulmonary edema: Secondary | ICD-10-CM | POA: Diagnosis not present

## 2021-09-16 DIAGNOSIS — Z8551 Personal history of malignant neoplasm of bladder: Secondary | ICD-10-CM

## 2021-09-16 DIAGNOSIS — I25119 Atherosclerotic heart disease of native coronary artery with unspecified angina pectoris: Secondary | ICD-10-CM

## 2021-09-16 DIAGNOSIS — I4891 Unspecified atrial fibrillation: Secondary | ICD-10-CM | POA: Diagnosis not present

## 2021-09-16 DIAGNOSIS — J984 Other disorders of lung: Secondary | ICD-10-CM | POA: Diagnosis not present

## 2021-09-16 HISTORY — PX: REPLACEMENT ASCENDING AORTA: SHX6068

## 2021-09-16 HISTORY — PX: TEE WITHOUT CARDIOVERSION: SHX5443

## 2021-09-16 HISTORY — PX: AORTIC VALVE REPLACEMENT: SHX41

## 2021-09-16 LAB — POCT I-STAT 7, (LYTES, BLD GAS, ICA,H+H)
Acid-Base Excess: 2 mmol/L (ref 0.0–2.0)
Acid-Base Excess: 4 mmol/L — ABNORMAL HIGH (ref 0.0–2.0)
Acid-Base Excess: 0 mmol/L (ref 0.0–2.0)
Acid-Base Excess: 2 mmol/L (ref 0.0–2.0)
Acid-Base Excess: 0 mmol/L (ref 0.0–2.0)
Acid-base deficit: 4 mmol/L — ABNORMAL HIGH (ref 0.0–2.0)
Acid-base deficit: 1 mmol/L (ref 0.0–2.0)
Acid-base deficit: 3 mmol/L — ABNORMAL HIGH (ref 0.0–2.0)
Acid-base deficit: 2 mmol/L (ref 0.0–2.0)
Acid-base deficit: 2 mmol/L (ref 0.0–2.0)
Bicarbonate: 29.3 mmol/L — ABNORMAL HIGH (ref 20.0–28.0)
Bicarbonate: 30 mmol/L — ABNORMAL HIGH (ref 20.0–28.0)
Bicarbonate: 27.2 mmol/L (ref 20.0–28.0)
Bicarbonate: 26.8 mmol/L (ref 20.0–28.0)
Bicarbonate: 25.7 mmol/L (ref 20.0–28.0)
Bicarbonate: 23.6 mmol/L (ref 20.0–28.0)
Bicarbonate: 24.9 mmol/L (ref 20.0–28.0)
Bicarbonate: 23.3 mmol/L (ref 20.0–28.0)
Bicarbonate: 23.5 mmol/L (ref 20.0–28.0)
Bicarbonate: 23.8 mmol/L (ref 20.0–28.0)
Calcium, Ion: 1.19 mmol/L (ref 1.15–1.40)
Calcium, Ion: 1.04 mmol/L — ABNORMAL LOW (ref 1.15–1.40)
Calcium, Ion: 1.12 mmol/L — ABNORMAL LOW (ref 1.15–1.40)
Calcium, Ion: 1.04 mmol/L — ABNORMAL LOW (ref 1.15–1.40)
Calcium, Ion: 1.08 mmol/L — ABNORMAL LOW (ref 1.15–1.40)
Calcium, Ion: 1.09 mmol/L — ABNORMAL LOW (ref 1.15–1.40)
Calcium, Ion: 1.04 mmol/L — ABNORMAL LOW (ref 1.15–1.40)
Calcium, Ion: 1.06 mmol/L — ABNORMAL LOW (ref 1.15–1.40)
Calcium, Ion: 1.05 mmol/L — ABNORMAL LOW (ref 1.15–1.40)
Calcium, Ion: 1.05 mmol/L — ABNORMAL LOW (ref 1.15–1.40)
HCT: 33 % — ABNORMAL LOW (ref 39.0–52.0)
HCT: 26 % — ABNORMAL LOW (ref 39.0–52.0)
HCT: 27 % — ABNORMAL LOW (ref 39.0–52.0)
HCT: 26 % — ABNORMAL LOW (ref 39.0–52.0)
HCT: 28 % — ABNORMAL LOW (ref 39.0–52.0)
HCT: 31 % — ABNORMAL LOW (ref 39.0–52.0)
HCT: 24 % — ABNORMAL LOW (ref 39.0–52.0)
HCT: 27 % — ABNORMAL LOW (ref 39.0–52.0)
HCT: 25 % — ABNORMAL LOW (ref 39.0–52.0)
HCT: 24 % — ABNORMAL LOW (ref 39.0–52.0)
Hemoglobin: 11.2 g/dL — ABNORMAL LOW (ref 13.0–17.0)
Hemoglobin: 8.8 g/dL — ABNORMAL LOW (ref 13.0–17.0)
Hemoglobin: 9.2 g/dL — ABNORMAL LOW (ref 13.0–17.0)
Hemoglobin: 8.8 g/dL — ABNORMAL LOW (ref 13.0–17.0)
Hemoglobin: 9.5 g/dL — ABNORMAL LOW (ref 13.0–17.0)
Hemoglobin: 10.5 g/dL — ABNORMAL LOW (ref 13.0–17.0)
Hemoglobin: 8.2 g/dL — ABNORMAL LOW (ref 13.0–17.0)
Hemoglobin: 9.2 g/dL — ABNORMAL LOW (ref 13.0–17.0)
Hemoglobin: 8.5 g/dL — ABNORMAL LOW (ref 13.0–17.0)
Hemoglobin: 8.2 g/dL — ABNORMAL LOW (ref 13.0–17.0)
O2 Saturation: 100 %
O2 Saturation: 100 %
O2 Saturation: 98 %
O2 Saturation: 100 %
O2 Saturation: 99 %
O2 Saturation: 97 %
O2 Saturation: 99 %
O2 Saturation: 99 %
O2 Saturation: 99 %
O2 Saturation: 98 %
Patient temperature: 36
Patient temperature: 35.7
Patient temperature: 36.2
Patient temperature: 36.4
Patient temperature: 36.5
Potassium: 3.9 mmol/L (ref 3.5–5.1)
Potassium: 3.9 mmol/L (ref 3.5–5.1)
Potassium: 5.3 mmol/L — ABNORMAL HIGH (ref 3.5–5.1)
Potassium: 4.5 mmol/L (ref 3.5–5.1)
Potassium: 4.2 mmol/L (ref 3.5–5.1)
Potassium: 4.5 mmol/L (ref 3.5–5.1)
Potassium: 4.3 mmol/L (ref 3.5–5.1)
Potassium: 4.5 mmol/L (ref 3.5–5.1)
Potassium: 4.7 mmol/L (ref 3.5–5.1)
Potassium: 4.4 mmol/L (ref 3.5–5.1)
Sodium: 136 mmol/L (ref 135–145)
Sodium: 137 mmol/L (ref 135–145)
Sodium: 135 mmol/L (ref 135–145)
Sodium: 136 mmol/L (ref 135–145)
Sodium: 138 mmol/L (ref 135–145)
Sodium: 138 mmol/L (ref 135–145)
Sodium: 139 mmol/L (ref 135–145)
Sodium: 139 mmol/L (ref 135–145)
Sodium: 137 mmol/L (ref 135–145)
Sodium: 138 mmol/L (ref 135–145)
TCO2: 31 mmol/L (ref 22–32)
TCO2: 32 mmol/L (ref 22–32)
TCO2: 29 mmol/L (ref 22–32)
TCO2: 28 mmol/L (ref 22–32)
TCO2: 27 mmol/L (ref 22–32)
TCO2: 25 mmol/L (ref 22–32)
TCO2: 26 mmol/L (ref 22–32)
TCO2: 25 mmol/L (ref 22–32)
TCO2: 25 mmol/L (ref 22–32)
TCO2: 25 mmol/L (ref 22–32)
pCO2 arterial: 57.6 mmHg — ABNORMAL HIGH (ref 32–48)
pCO2 arterial: 52 mmHg — ABNORMAL HIGH (ref 32–48)
pCO2 arterial: 59.6 mmHg — ABNORMAL HIGH (ref 32–48)
pCO2 arterial: 39.6 mmHg (ref 32–48)
pCO2 arterial: 45.3 mmHg (ref 32–48)
pCO2 arterial: 49.3 mmHg — ABNORMAL HIGH (ref 32–48)
pCO2 arterial: 44.4 mmHg (ref 32–48)
pCO2 arterial: 45.7 mmHg (ref 32–48)
pCO2 arterial: 43.2 mmHg (ref 32–48)
pCO2 arterial: 42.8 mmHg (ref 32–48)
pH, Arterial: 7.314 — ABNORMAL LOW (ref 7.35–7.45)
pH, Arterial: 7.369 (ref 7.35–7.45)
pH, Arterial: 7.267 — ABNORMAL LOW (ref 7.35–7.45)
pH, Arterial: 7.439 (ref 7.35–7.45)
pH, Arterial: 7.361 (ref 7.35–7.45)
pH, Arterial: 7.282 — ABNORMAL LOW (ref 7.35–7.45)
pH, Arterial: 7.35 (ref 7.35–7.45)
pH, Arterial: 7.312 — ABNORMAL LOW (ref 7.35–7.45)
pH, Arterial: 7.341 — ABNORMAL LOW (ref 7.35–7.45)
pH, Arterial: 7.351 (ref 7.35–7.45)
pO2, Arterial: 322 mmHg — ABNORMAL HIGH (ref 83–108)
pO2, Arterial: 430 mmHg — ABNORMAL HIGH (ref 83–108)
pO2, Arterial: 113 mmHg — ABNORMAL HIGH (ref 83–108)
pO2, Arterial: 337 mmHg — ABNORMAL HIGH (ref 83–108)
pO2, Arterial: 133 mmHg — ABNORMAL HIGH (ref 83–108)
pO2, Arterial: 100 mmHg (ref 83–108)
pO2, Arterial: 128 mmHg — ABNORMAL HIGH (ref 83–108)
pO2, Arterial: 140 mmHg — ABNORMAL HIGH (ref 83–108)
pO2, Arterial: 127 mmHg — ABNORMAL HIGH (ref 83–108)
pO2, Arterial: 118 mmHg — ABNORMAL HIGH (ref 83–108)

## 2021-09-16 LAB — BASIC METABOLIC PANEL
Anion gap: 7 (ref 5–15)
BUN: 18 mg/dL (ref 8–23)
CO2: 22 mmol/L (ref 22–32)
Calcium: 6.9 mg/dL — ABNORMAL LOW (ref 8.9–10.3)
Chloride: 108 mmol/L (ref 98–111)
Creatinine, Ser: 0.99 mg/dL (ref 0.61–1.24)
GFR, Estimated: >60 mL/min (ref >60)
Glucose, Bld: 201 mg/dL — ABNORMAL HIGH (ref 70–99)
Potassium: 4.5 mmol/L (ref 3.5–5.1)
Sodium: 137 mmol/L (ref 135–145)

## 2021-09-16 LAB — GLUCOSE, CAPILLARY
Glucose-Capillary: 171 mg/dL — ABNORMAL HIGH (ref 70–99)
Glucose-Capillary: 168 mg/dL — ABNORMAL HIGH (ref 70–99)
Glucose-Capillary: 87 mg/dL (ref 70–99)
Glucose-Capillary: 89 mg/dL (ref 70–99)
Glucose-Capillary: 110 mg/dL — ABNORMAL HIGH (ref 70–99)
Glucose-Capillary: 149 mg/dL — ABNORMAL HIGH (ref 70–99)
Glucose-Capillary: 166 mg/dL — ABNORMAL HIGH (ref 70–99)
Glucose-Capillary: 187 mg/dL — ABNORMAL HIGH (ref 70–99)
Glucose-Capillary: 172 mg/dL — ABNORMAL HIGH (ref 70–99)

## 2021-09-16 LAB — POCT I-STAT, CHEM 8
BUN: 18 mg/dL (ref 8–23)
BUN: 19 mg/dL (ref 8–23)
BUN: 20 mg/dL (ref 8–23)
BUN: 21 mg/dL (ref 8–23)
BUN: 20 mg/dL (ref 8–23)
Calcium, Ion: 1.13 mmol/L — ABNORMAL LOW (ref 1.15–1.40)
Calcium, Ion: 1.16 mmol/L (ref 1.15–1.40)
Calcium, Ion: 1.04 mmol/L — ABNORMAL LOW (ref 1.15–1.40)
Calcium, Ion: 1.01 mmol/L — ABNORMAL LOW (ref 1.15–1.40)
Calcium, Ion: 1.06 mmol/L — ABNORMAL LOW (ref 1.15–1.40)
Chloride: 101 mmol/L (ref 98–111)
Chloride: 99 mmol/L (ref 98–111)
Chloride: 102 mmol/L (ref 98–111)
Chloride: 101 mmol/L (ref 98–111)
Chloride: 101 mmol/L (ref 98–111)
Creatinine, Ser: 0.8 mg/dL (ref 0.61–1.24)
Creatinine, Ser: 0.8 mg/dL (ref 0.61–1.24)
Creatinine, Ser: 0.8 mg/dL (ref 0.61–1.24)
Creatinine, Ser: 0.8 mg/dL (ref 0.61–1.24)
Creatinine, Ser: 0.8 mg/dL (ref 0.61–1.24)
Glucose, Bld: 132 mg/dL — ABNORMAL HIGH (ref 70–99)
Glucose, Bld: 159 mg/dL — ABNORMAL HIGH (ref 70–99)
Glucose, Bld: 163 mg/dL — ABNORMAL HIGH (ref 70–99)
Glucose, Bld: 162 mg/dL — ABNORMAL HIGH (ref 70–99)
Glucose, Bld: 163 mg/dL — ABNORMAL HIGH (ref 70–99)
HCT: 27 % — ABNORMAL LOW (ref 39.0–52.0)
HCT: 29 % — ABNORMAL LOW (ref 39.0–52.0)
HCT: 27 % — ABNORMAL LOW (ref 39.0–52.0)
HCT: 26 % — ABNORMAL LOW (ref 39.0–52.0)
HCT: 29 % — ABNORMAL LOW (ref 39.0–52.0)
Hemoglobin: 9.2 g/dL — ABNORMAL LOW (ref 13.0–17.0)
Hemoglobin: 9.9 g/dL — ABNORMAL LOW (ref 13.0–17.0)
Hemoglobin: 9.2 g/dL — ABNORMAL LOW (ref 13.0–17.0)
Hemoglobin: 8.8 g/dL — ABNORMAL LOW (ref 13.0–17.0)
Hemoglobin: 9.9 g/dL — ABNORMAL LOW (ref 13.0–17.0)
Potassium: 3.8 mmol/L (ref 3.5–5.1)
Potassium: 3.9 mmol/L (ref 3.5–5.1)
Potassium: 4.7 mmol/L (ref 3.5–5.1)
Potassium: 4.9 mmol/L (ref 3.5–5.1)
Potassium: 4.2 mmol/L (ref 3.5–5.1)
Sodium: 136 mmol/L (ref 135–145)
Sodium: 136 mmol/L (ref 135–145)
Sodium: 135 mmol/L (ref 135–145)
Sodium: 134 mmol/L — ABNORMAL LOW (ref 135–145)
Sodium: 137 mmol/L (ref 135–145)
TCO2: 28 mmol/L (ref 22–32)
TCO2: 30 mmol/L (ref 22–32)
TCO2: 27 mmol/L (ref 22–32)
TCO2: 26 mmol/L (ref 22–32)
TCO2: 26 mmol/L (ref 22–32)

## 2021-09-16 LAB — CBC
HCT: 30.8 % — ABNORMAL LOW (ref 39.0–52.0)
HCT: 26.8 % — ABNORMAL LOW (ref 39.0–52.0)
Hemoglobin: 9.9 g/dL — ABNORMAL LOW (ref 13.0–17.0)
Hemoglobin: 8.6 g/dL — ABNORMAL LOW (ref 13.0–17.0)
MCH: 29.6 pg (ref 26.0–34.0)
MCH: 29.5 pg (ref 26.0–34.0)
MCHC: 32.1 g/dL (ref 30.0–36.0)
MCHC: 32.1 g/dL (ref 30.0–36.0)
MCV: 91.9 fL (ref 80.0–100.0)
MCV: 91.8 fL (ref 80.0–100.0)
Platelets: 159 10*3/uL (ref 150–400)
Platelets: 126 10*3/uL — ABNORMAL LOW (ref 150–400)
RBC: 3.35 MIL/uL — ABNORMAL LOW (ref 4.22–5.81)
RBC: 2.92 MIL/uL — ABNORMAL LOW (ref 4.22–5.81)
RDW: 13.7 % (ref 11.5–15.5)
RDW: 13.8 % (ref 11.5–15.5)
WBC: 18 10*3/uL — ABNORMAL HIGH (ref 4.0–10.5)
WBC: 11.6 10*3/uL — ABNORMAL HIGH (ref 4.0–10.5)
nRBC: 0 % (ref 0.0–0.2)
nRBC: 0 % (ref 0.0–0.2)

## 2021-09-16 LAB — APTT: aPTT: 30 seconds (ref 24–36)

## 2021-09-16 LAB — POCT I-STAT EG7
Acid-Base Excess: 4 mmol/L — ABNORMAL HIGH (ref 0.0–2.0)
Bicarbonate: 30.3 mmol/L — ABNORMAL HIGH (ref 20.0–28.0)
Calcium, Ion: 1.07 mmol/L — ABNORMAL LOW (ref 1.15–1.40)
HCT: 27 % — ABNORMAL LOW (ref 39.0–52.0)
Hemoglobin: 9.2 g/dL — ABNORMAL LOW (ref 13.0–17.0)
O2 Saturation: 91 %
Potassium: 3.7 mmol/L (ref 3.5–5.1)
Sodium: 138 mmol/L (ref 135–145)
TCO2: 32 mmol/L (ref 22–32)
pCO2, Ven: 55.7 mmHg (ref 44–60)
pH, Ven: 7.344 (ref 7.25–7.43)
pO2, Ven: 65 mmHg — ABNORMAL HIGH (ref 32–45)

## 2021-09-16 LAB — FIBRINOGEN: Fibrinogen: 341 mg/dL (ref 210–475)

## 2021-09-16 LAB — HEMOGLOBIN AND HEMATOCRIT, BLOOD
HCT: 26.2 % — ABNORMAL LOW (ref 39.0–52.0)
Hemoglobin: 8.9 g/dL — ABNORMAL LOW (ref 13.0–17.0)

## 2021-09-16 LAB — PROTIME-INR
INR: 0.9 (ref 0.8–1.2)
Prothrombin Time: 11.9 seconds (ref 11.4–15.2)

## 2021-09-16 LAB — ABO/RH: ABO/RH(D): A NEG

## 2021-09-16 LAB — MAGNESIUM: Magnesium: 2.9 mg/dL — ABNORMAL HIGH (ref 1.7–2.4)

## 2021-09-16 LAB — PLATELET COUNT: Platelets: 101 10*3/uL — ABNORMAL LOW (ref 150–400)

## 2021-09-16 SURGERY — REPLACEMENT, AORTIC VALVE, OPEN
Anesthesia: General | Site: Chest

## 2021-09-16 MED ORDER — SODIUM CHLORIDE 0.45 % IV SOLN: INTRAVENOUS | Status: DC | PRN

## 2021-09-16 MED ORDER — PHENYLEPHRINE 80 MCG/ML (10ML) SYRINGE FOR IV PUSH (FOR BLOOD PRESSURE SUPPORT)
PREFILLED_SYRINGE | INTRAVENOUS | Status: DC | PRN
  Administered 2021-09-16: 80 ug via INTRAVENOUS
  Administered 2021-09-16 (×2): 160 ug via INTRAVENOUS
  Administered 2021-09-16: 240 ug via INTRAVENOUS
  Administered 2021-09-16: 80 ug via INTRAVENOUS
  Administered 2021-09-16: 160 ug via INTRAVENOUS
  Administered 2021-09-16: 200 ug via INTRAVENOUS
  Administered 2021-09-16 (×3): 80 ug via INTRAVENOUS
  Administered 2021-09-16 (×2): 160 ug via INTRAVENOUS
  Administered 2021-09-16 (×2): 80 ug via INTRAVENOUS
  Administered 2021-09-16: 160 ug via INTRAVENOUS
  Administered 2021-09-16: 80 ug via INTRAVENOUS
  Administered 2021-09-16 (×3): 160 ug via INTRAVENOUS
  Administered 2021-09-16: 120 ug via INTRAVENOUS

## 2021-09-16 MED ORDER — METOPROLOL TARTRATE 12.5 MG HALF TABLET: 12.5000 mg | ORAL_TABLET | Freq: Two times a day (BID) | ORAL | Status: DC

## 2021-09-16 MED ORDER — PROPOFOL 10 MG/ML IV BOLUS
INTRAVENOUS | Status: DC | PRN
  Administered 2021-09-16: 30 mg via INTRAVENOUS
  Administered 2021-09-16: 50 mg via INTRAVENOUS
  Administered 2021-09-16 (×2): 20 mg via INTRAVENOUS
  Administered 2021-09-16: 40 mg via INTRAVENOUS
  Administered 2021-09-16: 20 mg via INTRAVENOUS
  Administered 2021-09-16: 50 mg via INTRAVENOUS
  Administered 2021-09-16 (×3): 20 mg via INTRAVENOUS

## 2021-09-16 MED ORDER — EPHEDRINE 5 MG/ML INJ
INTRAVENOUS | Status: AC
Start: 2021-09-16 — End: ?
  Filled 2021-09-16: qty 5

## 2021-09-16 MED ORDER — PHENYLEPHRINE 80 MCG/ML (10ML) SYRINGE FOR IV PUSH (FOR BLOOD PRESSURE SUPPORT)
PREFILLED_SYRINGE | INTRAVENOUS | Status: AC
  Filled 2021-09-16: qty 40

## 2021-09-16 MED ORDER — PROPOFOL 10 MG/ML IV BOLUS
INTRAVENOUS | Status: AC
  Filled 2021-09-16: qty 20

## 2021-09-16 MED ORDER — PROTAMINE SULFATE 10 MG/ML IV SOLN
INTRAVENOUS | Status: DC | PRN
  Administered 2021-09-16: 410 mg via INTRAVENOUS
  Administered 2021-09-16: 30 mg via INTRAVENOUS

## 2021-09-16 MED ORDER — LACTATED RINGERS IV SOLN: 500.0000 mL | Freq: Once | INTRAVENOUS | Status: DC | PRN

## 2021-09-16 MED ORDER — TRANEXAMIC ACID 1000 MG/10ML IV SOLN
1.5000 mg/kg/h | INTRAVENOUS | Status: DC
  Filled 2021-09-16: qty 25

## 2021-09-16 MED ORDER — PHENYLEPHRINE HCL-NACL 20-0.9 MG/250ML-% IV SOLN
0.0000 ug/min | INTRAVENOUS | Status: DC
  Administered 2021-09-16: 40 ug/min via INTRAVENOUS
  Administered 2021-09-16: 70 ug/min via INTRAVENOUS
  Administered 2021-09-16: 55 ug/min via INTRAVENOUS
  Administered 2021-09-17: 35.067 ug/min via INTRAVENOUS
  Administered 2021-09-17: 50 ug/min via INTRAVENOUS
  Filled 2021-09-16: qty 500
  Filled 2021-09-16 (×3): qty 250

## 2021-09-16 MED ORDER — CHLORHEXIDINE GLUCONATE 0.12 % MT SOLN
15.0000 mL | OROMUCOSAL | Status: AC
  Administered 2021-09-16: 15 mL via OROMUCOSAL

## 2021-09-16 MED ORDER — MIDAZOLAM HCL (PF) 10 MG/2ML IJ SOLN
INTRAMUSCULAR | Status: AC
  Filled 2021-09-16: qty 2

## 2021-09-16 MED ORDER — PLASMA-LYTE A IV SOLN
INTRAVENOUS | Status: DC | PRN
  Administered 2021-09-16: 500 mL

## 2021-09-16 MED ORDER — ONDANSETRON HCL 4 MG/2ML IJ SOLN: 4.0000 mg | Freq: Four times a day (QID) | INTRAMUSCULAR | Status: DC | PRN

## 2021-09-16 MED ORDER — TAMSULOSIN HCL 0.4 MG PO CAPS
0.4000 mg | ORAL_CAPSULE | Freq: Every day | ORAL | Status: DC
  Administered 2021-09-17 – 2021-09-23 (×6): 0.4 mg via ORAL
  Filled 2021-09-16 (×7): qty 1

## 2021-09-16 MED ORDER — HEPARIN SODIUM (PORCINE) 1000 UNIT/ML IJ SOLN
INTRAMUSCULAR | Status: DC | PRN
  Administered 2021-09-16: 44000 [IU] via INTRAVENOUS

## 2021-09-16 MED ORDER — METOPROLOL TARTRATE 5 MG/5ML IV SOLN: 2.5000 mg | INTRAVENOUS | Status: DC | PRN

## 2021-09-16 MED ORDER — MIDAZOLAM HCL (PF) 5 MG/ML IJ SOLN
INTRAMUSCULAR | Status: DC | PRN
  Administered 2021-09-16: 3 mg via INTRAVENOUS
  Administered 2021-09-16 (×4): 2 mg via INTRAVENOUS
  Administered 2021-09-16: 1 mg via INTRAVENOUS

## 2021-09-16 MED ORDER — POTASSIUM CHLORIDE 10 MEQ/50ML IV SOLN: 10.0000 meq | INTRAVENOUS | Status: AC

## 2021-09-16 MED ORDER — EPHEDRINE SULFATE-NACL 50-0.9 MG/10ML-% IV SOSY
PREFILLED_SYRINGE | INTRAVENOUS | Status: DC | PRN
  Administered 2021-09-16: 2.5 mg via INTRAVENOUS
  Administered 2021-09-16: 5 mg via INTRAVENOUS

## 2021-09-16 MED ORDER — ACETAMINOPHEN 500 MG PO TABS
1000.0000 mg | ORAL_TABLET | Freq: Four times a day (QID) | ORAL | Status: AC
  Administered 2021-09-17 – 2021-09-21 (×15): 1000 mg via ORAL
  Filled 2021-09-16 (×17): qty 2

## 2021-09-16 MED ORDER — FENTANYL CITRATE (PF) 250 MCG/5ML IJ SOLN
INTRAMUSCULAR | Status: AC
  Filled 2021-09-16: qty 5

## 2021-09-16 MED ORDER — 0.9 % SODIUM CHLORIDE (POUR BTL) OPTIME
TOPICAL | Status: DC | PRN
  Administered 2021-09-16: 6000 mL

## 2021-09-16 MED ORDER — SODIUM CHLORIDE (PF) 0.9 % IJ SOLN
OROMUCOSAL | Status: DC | PRN
  Administered 2021-09-16: 24 mL via TOPICAL

## 2021-09-16 MED ORDER — THROMBIN (RECOMBINANT) 20000 UNITS EX SOLR
CUTANEOUS | Status: AC
  Filled 2021-09-16: qty 20000

## 2021-09-16 MED ORDER — TRAMADOL HCL 50 MG PO TABS
50.0000 mg | ORAL_TABLET | ORAL | Status: DC | PRN
  Administered 2021-09-17 – 2021-09-18 (×2): 50 mg via ORAL
  Administered 2021-09-18 – 2021-09-21 (×6): 100 mg via ORAL
  Filled 2021-09-16: qty 1
  Filled 2021-09-16: qty 2
  Filled 2021-09-16: qty 1
  Filled 2021-09-16 (×6): qty 2

## 2021-09-16 MED ORDER — SODIUM BICARBONATE 8.4 % IV SOLN
50.0000 meq | Freq: Once | INTRAVENOUS | Status: AC
  Administered 2021-09-16: 50 meq via INTRAVENOUS

## 2021-09-16 MED ORDER — SODIUM CHLORIDE 0.9% FLUSH
3.0000 mL | Freq: Two times a day (BID) | INTRAVENOUS | Status: DC
  Administered 2021-09-17 – 2021-09-23 (×10): 3 mL via INTRAVENOUS

## 2021-09-16 MED ORDER — BISACODYL 10 MG RE SUPP: 10.0000 mg | Freq: Every day | RECTAL | Status: DC

## 2021-09-16 MED ORDER — COAGULATION FACTOR VIIA RECOMB 1 MG IV SOLR
45.0000 ug/kg | Freq: Once | INTRAVENOUS | Status: AC
  Administered 2021-09-16: 6000 ug via INTRAVENOUS
  Filled 2021-09-16: qty 6

## 2021-09-16 MED ORDER — METHYLPREDNISOLONE SODIUM SUCC 125 MG IJ SOLR
INTRAMUSCULAR | Status: DC | PRN
  Administered 2021-09-16: 125 mg via INTRAVENOUS

## 2021-09-16 MED ORDER — LACTATED RINGERS IV SOLN: INTRAVENOUS | Status: DC | PRN

## 2021-09-16 MED ORDER — ASPIRIN 325 MG PO TBEC
325.0000 mg | DELAYED_RELEASE_TABLET | Freq: Every day | ORAL | Status: DC
  Administered 2021-09-17 – 2021-09-20 (×4): 325 mg via ORAL
  Filled 2021-09-16 (×5): qty 1

## 2021-09-16 MED ORDER — SODIUM CHLORIDE 0.9% FLUSH: 3.0000 mL | INTRAVENOUS | Status: DC | PRN

## 2021-09-16 MED ORDER — FAMOTIDINE IN NACL 20-0.9 MG/50ML-% IV SOLN
20.0000 mg | Freq: Two times a day (BID) | INTRAVENOUS | Status: AC
  Administered 2021-09-16 (×2): 20 mg via INTRAVENOUS
  Filled 2021-09-16 (×2): qty 50

## 2021-09-16 MED ORDER — MECLIZINE HCL 25 MG PO TABS: 25.0000 mg | ORAL_TABLET | Freq: Three times a day (TID) | ORAL | Status: DC | PRN

## 2021-09-16 MED ORDER — ROSUVASTATIN CALCIUM 20 MG PO TABS
20.0000 mg | ORAL_TABLET | Freq: Every day | ORAL | Status: DC
  Administered 2021-09-17 – 2021-09-23 (×7): 20 mg via ORAL
  Filled 2021-09-16 (×7): qty 1

## 2021-09-16 MED ORDER — DEXTROSE 50 % IV SOLN: 0.0000 mL | INTRAVENOUS | Status: DC | PRN

## 2021-09-16 MED ORDER — NITROGLYCERIN IN D5W 200-5 MCG/ML-% IV SOLN: 0.0000 ug/min | INTRAVENOUS | Status: DC

## 2021-09-16 MED ORDER — METOCLOPRAMIDE HCL 5 MG/ML IJ SOLN
10.0000 mg | Freq: Four times a day (QID) | INTRAMUSCULAR | Status: AC
  Administered 2021-09-16 – 2021-09-17 (×4): 10 mg via INTRAVENOUS
  Filled 2021-09-16 (×3): qty 2

## 2021-09-16 MED ORDER — ORAL CARE MOUTH RINSE: 15.0000 mL | Freq: Once | OROMUCOSAL | Status: AC

## 2021-09-16 MED ORDER — ACETAMINOPHEN 650 MG RE SUPP
650.0000 mg | Freq: Once | RECTAL | Status: AC
  Administered 2021-09-16: 650 mg via RECTAL

## 2021-09-16 MED ORDER — METOPROLOL TARTRATE 25 MG/10 ML ORAL SUSPENSION
12.5000 mg | Freq: Two times a day (BID) | ORAL | Status: DC
  Administered 2021-09-16: 12.5 mg

## 2021-09-16 MED ORDER — SODIUM CHLORIDE 0.9 % IV SOLN: INTRAVENOUS | Status: DC

## 2021-09-16 MED ORDER — ALBUMIN HUMAN 5 % IV SOLN: INTRAVENOUS | Status: DC | PRN

## 2021-09-16 MED ORDER — DEXMEDETOMIDINE HCL IN NACL 400 MCG/100ML IV SOLN
INTRAVENOUS | Status: AC
Start: 2021-09-16 — End: ?
  Filled 2021-09-16: qty 100

## 2021-09-16 MED ORDER — INSULIN REGULAR(HUMAN) IN NACL 100-0.9 UT/100ML-% IV SOLN: INTRAVENOUS | Status: DC

## 2021-09-16 MED ORDER — DOCUSATE SODIUM 100 MG PO CAPS
200.0000 mg | ORAL_CAPSULE | Freq: Every day | ORAL | Status: DC
  Administered 2021-09-17 – 2021-09-23 (×6): 200 mg via ORAL
  Filled 2021-09-16 (×7): qty 2

## 2021-09-16 MED ORDER — ALBUMIN HUMAN 5 % IV SOLN
250.0000 mL | INTRAVENOUS | Status: DC | PRN
  Administered 2021-09-16 (×2): 12.5 g via INTRAVENOUS

## 2021-09-16 MED ORDER — MIDAZOLAM HCL 2 MG/2ML IJ SOLN
INTRAMUSCULAR | Status: AC
  Filled 2021-09-16: qty 2

## 2021-09-16 MED ORDER — MORPHINE SULFATE (PF) 2 MG/ML IV SOLN
1.0000 mg | INTRAVENOUS | Status: DC | PRN
  Administered 2021-09-17 – 2021-09-18 (×6): 2 mg via INTRAVENOUS
  Filled 2021-09-16 (×6): qty 1

## 2021-09-16 MED ORDER — VANCOMYCIN HCL IN DEXTROSE 1-5 GM/200ML-% IV SOLN
1000.0000 mg | Freq: Once | INTRAVENOUS | Status: AC
  Administered 2021-09-16: 1000 mg via INTRAVENOUS
  Filled 2021-09-16: qty 200

## 2021-09-16 MED ORDER — MUPIROCIN 2 % EX OINT
1.0000 "application " | TOPICAL_OINTMENT | Freq: Two times a day (BID) | CUTANEOUS | Status: AC
  Administered 2021-09-16 – 2021-09-20 (×9): 1 via NASAL
  Filled 2021-09-16 (×2): qty 22

## 2021-09-16 MED ORDER — CHLORHEXIDINE GLUCONATE CLOTH 2 % EX PADS
6.0000 | MEDICATED_PAD | Freq: Every day | CUTANEOUS | Status: AC
  Administered 2021-09-16 – 2021-09-20 (×5): 6 via TOPICAL

## 2021-09-16 MED ORDER — HEMOSTATIC AGENTS (NO CHARGE) OPTIME
TOPICAL | Status: DC | PRN
  Administered 2021-09-16 (×4): 1 via TOPICAL

## 2021-09-16 MED ORDER — VENLAFAXINE HCL ER 75 MG PO CP24
150.0000 mg | ORAL_CAPSULE | Freq: Every day | ORAL | Status: DC
  Administered 2021-09-17 – 2021-09-23 (×7): 150 mg via ORAL
  Filled 2021-09-16: qty 2
  Filled 2021-09-16: qty 1
  Filled 2021-09-16 (×2): qty 2
  Filled 2021-09-16: qty 1
  Filled 2021-09-16: qty 2

## 2021-09-16 MED ORDER — BISACODYL 5 MG PO TBEC
10.0000 mg | DELAYED_RELEASE_TABLET | Freq: Every day | ORAL | Status: DC
  Administered 2021-09-17 – 2021-09-23 (×6): 10 mg via ORAL
  Filled 2021-09-16 (×7): qty 2

## 2021-09-16 MED ORDER — HEMOSTATIC AGENTS (NO CHARGE) OPTIME
TOPICAL | Status: DC | PRN
  Administered 2021-09-16: 1 via TOPICAL

## 2021-09-16 MED ORDER — THROMBIN (RECOMBINANT) 20000 UNITS EX SOLR
CUTANEOUS | Status: DC | PRN
  Administered 2021-09-16: 20000 [IU] via TOPICAL

## 2021-09-16 MED ORDER — ACETAMINOPHEN 160 MG/5ML PO SOLN: 650.0000 mg | Freq: Once | ORAL | Status: AC

## 2021-09-16 MED ORDER — CEFAZOLIN SODIUM-DEXTROSE 2-4 GM/100ML-% IV SOLN
2.0000 g | Freq: Three times a day (TID) | INTRAVENOUS | Status: AC
  Administered 2021-09-16 – 2021-09-18 (×6): 2 g via INTRAVENOUS
  Filled 2021-09-16 (×6): qty 100

## 2021-09-16 MED ORDER — IPRATROPIUM BROMIDE 0.03 % NA SOLN: 2.0000 | Freq: Two times a day (BID) | NASAL | Status: DC | PRN

## 2021-09-16 MED ORDER — FENTANYL CITRATE (PF) 250 MCG/5ML IJ SOLN
INTRAMUSCULAR | Status: DC | PRN
  Administered 2021-09-16 (×2): 100 ug via INTRAVENOUS
  Administered 2021-09-16: 150 ug via INTRAVENOUS
  Administered 2021-09-16 (×2): 50 ug via INTRAVENOUS
  Administered 2021-09-16: 100 ug via INTRAVENOUS
  Administered 2021-09-16: 200 ug via INTRAVENOUS

## 2021-09-16 MED ORDER — ASPIRIN 81 MG PO CHEW: 324.0000 mg | CHEWABLE_TABLET | Freq: Every day | ORAL | Status: DC

## 2021-09-16 MED ORDER — ROCURONIUM BROMIDE 10 MG/ML (PF) SYRINGE
PREFILLED_SYRINGE | INTRAVENOUS | Status: DC | PRN
  Administered 2021-09-16 (×2): 40 mg via INTRAVENOUS
  Administered 2021-09-16: 100 mg via INTRAVENOUS
  Administered 2021-09-16: 40 mg via INTRAVENOUS

## 2021-09-16 MED ORDER — PANTOPRAZOLE SODIUM 40 MG PO TBEC
40.0000 mg | DELAYED_RELEASE_TABLET | Freq: Every day | ORAL | Status: DC
  Administered 2021-09-18 – 2021-09-23 (×6): 40 mg via ORAL
  Filled 2021-09-16 (×6): qty 1

## 2021-09-16 MED ORDER — LACTATED RINGERS IV SOLN: INTRAVENOUS | Status: DC

## 2021-09-16 MED ORDER — METOPROLOL TARTRATE 12.5 MG HALF TABLET
12.5000 mg | ORAL_TABLET | Freq: Once | ORAL | Status: DC
  Filled 2021-09-16: qty 1

## 2021-09-16 MED ORDER — ROCURONIUM BROMIDE 10 MG/ML (PF) SYRINGE
PREFILLED_SYRINGE | INTRAVENOUS | Status: AC
  Filled 2021-09-16: qty 30

## 2021-09-16 MED ORDER — CHLORHEXIDINE GLUCONATE 4 % EX LIQD: 30.0000 mL | CUTANEOUS | Status: DC

## 2021-09-16 MED ORDER — CHLORHEXIDINE GLUCONATE 0.12 % MT SOLN: 15.0000 mL | Freq: Once | OROMUCOSAL | Status: DC

## 2021-09-16 MED ORDER — ACETAMINOPHEN 160 MG/5ML PO SOLN: 1000.0000 mg | Freq: Four times a day (QID) | ORAL | Status: DC

## 2021-09-16 MED ORDER — ~~LOC~~ CARDIAC SURGERY, PATIENT & FAMILY EDUCATION
Freq: Once | Status: DC
  Filled 2021-09-16: qty 1

## 2021-09-16 MED ORDER — CHLORHEXIDINE GLUCONATE 0.12 % MT SOLN
15.0000 mL | Freq: Once | OROMUCOSAL | Status: AC
  Administered 2021-09-16: 15 mL via OROMUCOSAL
  Filled 2021-09-16: qty 15

## 2021-09-16 MED ORDER — OXYCODONE HCL 5 MG PO TABS: 5.0000 mg | ORAL_TABLET | ORAL | Status: DC | PRN

## 2021-09-16 MED ORDER — MIDAZOLAM HCL 2 MG/2ML IJ SOLN: 2.0000 mg | INTRAMUSCULAR | Status: DC | PRN

## 2021-09-16 MED ORDER — MAGNESIUM SULFATE 4 GM/100ML IV SOLN
4.0000 g | Freq: Once | INTRAVENOUS | Status: AC
  Administered 2021-09-16: 4 g via INTRAVENOUS
  Filled 2021-09-16: qty 100

## 2021-09-16 MED ORDER — SODIUM CHLORIDE 0.9 % IV SOLN: 250.0000 mL | INTRAVENOUS | Status: DC

## 2021-09-16 MED ORDER — DEXMEDETOMIDINE HCL IN NACL 400 MCG/100ML IV SOLN
0.0000 ug/kg/h | INTRAVENOUS | Status: DC
  Administered 2021-09-16: 0.3 ug/kg/h via INTRAVENOUS
  Filled 2021-09-16: qty 100

## 2021-09-16 SURGICAL SUPPLY — 91 items
ADAPTER CARDIO PERF ANTE/RETRO (ADAPTER) ×3 IMPLANT
ADAPTER DLP PERFUSION .25INX2I (MISCELLANEOUS) ×1 IMPLANT
BAG DECANTER FOR FLEXI CONT (MISCELLANEOUS) ×3 IMPLANT
BLADE CLIPPER SURG (BLADE) ×3 IMPLANT
BLADE STERNUM SYSTEM 6 (BLADE) ×3 IMPLANT
BLADE SURG 15 STRL LF DISP TIS (BLADE) ×2 IMPLANT
BLADE SURG 15 STRL SS (BLADE) ×3
CANISTER SUCT 3000ML PPV (MISCELLANEOUS) ×3 IMPLANT
CANNULA GUNDRY RCSP 15FR (MISCELLANEOUS) ×3 IMPLANT
CANNULA SUMP PERICARDIAL (CANNULA) ×1 IMPLANT
CATH HEART VENT LEFT (CATHETERS) ×2 IMPLANT
CATH ROBINSON RED A/P 18FR (CATHETERS) ×11 IMPLANT
CATH THORACIC 36FR (CATHETERS) ×3 IMPLANT
CATH THORACIC 36FR RT ANG (CATHETERS) ×4 IMPLANT
CNTNR URN SCR LID CUP LEK RST (MISCELLANEOUS) ×2 IMPLANT
CONT SPEC 4OZ STRL OR WHT (MISCELLANEOUS) ×6
CONTAINER PROTECT SURGISLUSH (MISCELLANEOUS) ×6 IMPLANT
COVER SURGICAL LIGHT HANDLE (MISCELLANEOUS) ×3 IMPLANT
DEVICE SUT CK QUICK LOAD MINI (Prosthesis & Implant Heart) ×1 IMPLANT
DEVICE SUT QUICK LOAD TK 5 (STAPLE) ×1 IMPLANT
DRAPE CARDIOVASCULAR INCISE (DRAPES) ×3
DRAPE SRG 135X102X78XABS (DRAPES) ×2 IMPLANT
DRAPE WARM FLUID 44X44 (DRAPES) ×3 IMPLANT
DRSG COVADERM 4X14 (GAUZE/BANDAGES/DRESSINGS) ×3 IMPLANT
ELECT CAUTERY BLADE 6.4 (BLADE) ×3 IMPLANT
ELECT REM PT RETURN 9FT ADLT (ELECTROSURGICAL) ×6
ELECTRODE REM PT RTRN 9FT ADLT (ELECTROSURGICAL) ×4 IMPLANT
FELT TEFLON 1X6 (MISCELLANEOUS) ×7 IMPLANT
GAUZE 4X4 16PLY ~~LOC~~+RFID DBL (SPONGE) ×3 IMPLANT
GAUZE SPONGE 4X4 12PLY STRL (GAUZE/BANDAGES/DRESSINGS) ×3 IMPLANT
GLOVE BIO SURGEON STRL SZ 6 (GLOVE) IMPLANT
GLOVE BIO SURGEON STRL SZ 6.5 (GLOVE) ×3 IMPLANT
GLOVE BIO SURGEON STRL SZ7 (GLOVE) IMPLANT
GLOVE BIO SURGEON STRL SZ7.5 (GLOVE) ×4 IMPLANT
GLOVE SURG MICRO LTX SZ7 (GLOVE) ×6 IMPLANT
GOWN STRL REUS W/ TWL LRG LVL3 (GOWN DISPOSABLE) ×8 IMPLANT
GOWN STRL REUS W/ TWL XL LVL3 (GOWN DISPOSABLE) ×2 IMPLANT
GOWN STRL REUS W/TWL LRG LVL3 (GOWN DISPOSABLE) ×12
GOWN STRL REUS W/TWL XL LVL3 (GOWN DISPOSABLE) ×6
GRAFT HEMASHIELD 30X10 (Vascular Products) ×1 IMPLANT
HEMOSTAT POWDER SURGIFOAM 1G (HEMOSTASIS) ×12 IMPLANT
HEMOSTAT SURGICEL 2X14 (HEMOSTASIS) ×6 IMPLANT
INSERT FOGARTY XLG (MISCELLANEOUS) ×1 IMPLANT
KIT BASIN OR (CUSTOM PROCEDURE TRAY) ×3 IMPLANT
KIT CATH CPB BARTLE (MISCELLANEOUS) ×3 IMPLANT
KIT SUCTION CATH 14FR (SUCTIONS) ×3 IMPLANT
KIT SUT CK MINI COMBO 4X17 (Prosthesis & Implant Heart) ×1 IMPLANT
KIT TURNOVER KIT B (KITS) ×3 IMPLANT
LINE VENT (MISCELLANEOUS) ×2 IMPLANT
LOOP VESSEL SUPERMAXI WHITE (MISCELLANEOUS) ×1 IMPLANT
NS IRRIG 1000ML POUR BTL (IV SOLUTION) ×18 IMPLANT
PACK E OPEN HEART (SUTURE) ×3 IMPLANT
PACK OPEN HEART (CUSTOM PROCEDURE TRAY) ×3 IMPLANT
PAD ARMBOARD 7.5X6 YLW CONV (MISCELLANEOUS) ×6 IMPLANT
POSITIONER HEAD DONUT 9IN (MISCELLANEOUS) ×3 IMPLANT
SET MPS 3-ND DEL (MISCELLANEOUS) ×1 IMPLANT
SET VEIN GRAFT PERF (SET/KITS/TRAYS/PACK) ×1 IMPLANT
SPONGE T-LAP 18X18 ~~LOC~~+RFID (SPONGE) ×13 IMPLANT
SPONGE T-LAP 4X18 ~~LOC~~+RFID (SPONGE) ×7 IMPLANT
SUT BONE WAX W31G (SUTURE) ×3 IMPLANT
SUT EB EXC GRN/WHT 2-0 V-5 (SUTURE) ×6 IMPLANT
SUT ETHIBON EXCEL 2-0 V-5 (SUTURE) ×2 IMPLANT
SUT ETHIBOND 2 0 SH (SUTURE) ×6
SUT ETHIBOND 2 0 SH 36X2 (SUTURE) IMPLANT
SUT ETHIBOND V-5 VALVE (SUTURE) ×4 IMPLANT
SUT PROLENE 3 0 SH 48 (SUTURE) ×3 IMPLANT
SUT PROLENE 3 0 SH DA (SUTURE) ×1 IMPLANT
SUT PROLENE 3 0 SH1 36 (SUTURE) ×3 IMPLANT
SUT PROLENE 4 0 RB 1 (SUTURE) ×42
SUT PROLENE 4 0 SH DA (SUTURE) ×1 IMPLANT
SUT PROLENE 4-0 RB1 .5 CRCL 36 (SUTURE) ×6 IMPLANT
SUT SILK 2 0 SH CR/8 (SUTURE) ×1 IMPLANT
SUT STEEL 6MS V (SUTURE) IMPLANT
SUT STEEL STERNAL CCS#1 18IN (SUTURE) ×2 IMPLANT
SUT STEEL SZ 6 DBL 3X14 BALL (SUTURE) ×1 IMPLANT
SUT VIC AB 1 CTX 36 (SUTURE) ×9
SUT VIC AB 1 CTX36XBRD ANBCTR (SUTURE) ×4 IMPLANT
SUT VIC AB 2-0 CTX 27 (SUTURE) ×1 IMPLANT
SUT VIC AB 3-0 X1 27 (SUTURE) ×1 IMPLANT
SYR 10ML KIT SKIN ADHESIVE (MISCELLANEOUS) ×1 IMPLANT
SYSTEM SAHARA CHEST DRAIN ATS (WOUND CARE) ×3 IMPLANT
TAPE PAPER 2X10 WHT MICROPORE (GAUZE/BANDAGES/DRESSINGS) ×1 IMPLANT
TOWEL GREEN STERILE (TOWEL DISPOSABLE) ×3 IMPLANT
TOWEL GREEN STERILE FF (TOWEL DISPOSABLE) ×3 IMPLANT
TRAY FOLEY SLVR 16FR TEMP STAT (SET/KITS/TRAYS/PACK) ×3 IMPLANT
TUBE CONNECTING 20X1/4 (TUBING) ×1 IMPLANT
UNDERPAD 30X36 HEAVY ABSORB (UNDERPADS AND DIAPERS) ×3 IMPLANT
VALVE AORTIC SZ25 INSP/RESIL (Prosthesis & Implant Heart) ×1 IMPLANT
VENT LEFT HEART 12002 (CATHETERS) ×3
WATER STERILE IRR 1000ML POUR (IV SOLUTION) ×6 IMPLANT
YANKAUER SUCT BULB TIP NO VENT (SUCTIONS) ×1 IMPLANT

## 2021-09-16 NOTE — Interval H&P Note (Signed)
History and Physical Interval Note:  09/16/2021 7:06 AM  Luis [name]  has presented today for surgery, with the diagnosis of SEVERE AS TAA.  The various methods of treatment have been discussed with the patient and family. After consideration of risks, benefits and other options for treatment, the patient has consented to  Procedure(s) with comments: AORTIC VALVE REPLACEMENT (AVR) (N/A) REPLACEMENT ASCENDING AORTA (N/A) - CIRC ARREST TRANSESOPHAGEAL ECHOCARDIOGRAM (TEE) (N/A) as a surgical intervention.  The patient's history has been reviewed, patient examined, no change in status, stable for surgery.  I have reviewed the patient's chart and labs.  Questions were answered to the patient's satisfaction.     Gaye Pollack

## 2021-09-16 NOTE — Hospital Course (Addendum)
History of Present Illness:  Luis [name] is a 66 year old gentleman with a history of hypertension, hyperlipidemia, coronary artery disease status post PCI of the RCA in 2012 with DES,  bladder cancer that has been treated with TURBT and chemotherapy with BCG, and moderate aortic stenosis.  His most recent echo on 07/01/2021 showed an increase in the mean gradient to 48 mmHg with a peak gradient of 76 mmHg.  Valve area by VTI was 0.91 cm.  The valve appeared bicuspid.  The ascending aorta was measured at 4.5 cm.  Left ventricular ejection fraction was 60 to 65% with mild concentric LVH and grade 1 diastolic dysfunction.  Cardiac catheterization on 07/08/2021 showed patent stents in the RCA and otherwise no significant disease.  He was referred to [facility] for surgical consultation.  He was evaluated by Dr. Cyndia Bent at which time he continued to have exertional shortness of breath and fatigue.  He denies any chest pain or pressure.  He denies dizziness and syncope.  He has had no orthopnea or PND.  He denies peripheral edema.  His activity has primarily been limited by degenerative arthritis in his knees.  He had initially undergone workup for TAVR, however he was noted to have an enlarged Aorta on his CTA of the chest.  Due to this it was recommended the patient undergo Aortic Valve Replacement with replacement of his Ascending Aorta.  The risks and benefits of the procedure were explained to the patient and he was agreeable to proceed.  Hospital Course:  Luis [name] presented to [facility] on 09/16/2021.  He was taken to the operating room and underwent Aortic Valve Replacement with a 25 mm Edwards Resilia Aortic Valve and Replacement of Ascending Aorta under hypothermic circulatory arrest.  He tolerated the procedure without difficulty and was taken to the SICU in stable condition.  The patient was extubated the evening of surgery without difficulty.  He was weaned of  Neo-synephrine as hemodynamics allowed.  His chest tubes and arterial lines were removed without difficulty.

## 2021-09-16 NOTE — Anesthesia Procedure Notes (Signed)
Procedure Name: Intubation Date/Time: 09/16/2021 7:52 AM Performed by: Vonna Drafts, CRNA Pre-anesthesia Checklist: Patient identified, Emergency Drugs available, Suction available and Patient being monitored Patient Re-evaluated:Patient Re-evaluated prior to induction Oxygen Delivery Method: Circle system utilized Preoxygenation: Pre-oxygenation with 100% oxygen Induction Type: IV induction Ventilation: Mask ventilation without difficulty and Oral airway inserted - appropriate to patient size Laryngoscope Size: Mac and 4 Grade View: Grade I Tube type: Oral Tube size: 8.0 mm Number of attempts: 1 Airway Equipment and Method: Stylet and Oral airway Placement Confirmation: ETT inserted through vocal cords under direct vision, positive ETCO2 and breath sounds checked- equal and bilateral Secured at: 23 cm Tube secured with: Tape Dental Injury: Teeth and Oropharynx as per pre-operative assessment

## 2021-09-16 NOTE — Plan of Care (Signed)
  Problem: Education: Goal: Knowledge of General Education information will improve Description: Including pain rating scale, medication(s)/side effects and non-pharmacologic comfort measures Outcome: Progressing   Problem: Health Behavior/Discharge Planning: Goal: Ability to manage health-related needs will improve Outcome: Progressing   Problem: Clinical Measurements: Goal: Ability to maintain clinical measurements within normal limits will improve Outcome: Progressing Goal: Will remain free from infection Outcome: Progressing Goal: Diagnostic test results will improve Outcome: Progressing Goal: Respiratory complications will improve Outcome: Progressing Goal: Cardiovascular complication will be avoided Outcome: Progressing   Problem: Activity: Goal: Risk for activity intolerance will decrease Outcome: Progressing   Problem: Nutrition: Goal: Adequate nutrition will be maintained Outcome: Progressing   Problem: Coping: Goal: Level of anxiety will decrease Outcome: Progressing   Problem: Elimination: Goal: Will not experience complications related to bowel motility Outcome: Progressing Goal: Will not experience complications related to urinary retention Outcome: Progressing   Problem: Pain Managment: Goal: General experience of comfort will improve Outcome: Progressing   Problem: Safety: Goal: Ability to remain free from injury will improve Outcome: Progressing   Problem: Skin Integrity: Goal: Risk for impaired skin integrity will decrease Outcome: Progressing   Problem: Education: Goal: Will demonstrate proper wound care and an understanding of methods to prevent future damage Outcome: Progressing Goal: Knowledge of disease or condition will improve Outcome: Progressing Goal: Knowledge of the prescribed therapeutic regimen will improve Outcome: Progressing Goal: Individualized Educational Video(s) Outcome: Progressing   Problem: Activity: Goal: Risk for  activity intolerance will decrease Outcome: Progressing   Problem: Cardiac: Goal: Will achieve and/or maintain hemodynamic stability Outcome: Progressing   Problem: Clinical Measurements: Goal: Postoperative complications will be avoided or minimized Outcome: Progressing

## 2021-09-16 NOTE — Anesthesia Procedure Notes (Signed)
Central Venous Catheter Insertion Performed by: Santa Lighter, MD, anesthesiologist Start/End5/25/2023 7:10 AM, 09/16/2021 7:15 AM Patient location: Pre-op. Preanesthetic checklist: patient identified, IV checked, site marked, risks and benefits discussed, surgical consent, monitors and equipment checked, pre-op evaluation and timeout performed Position: Trendelenburg Hand hygiene performed  and maximum sterile barriers used  Total catheter length 100. PA cath was placed.Swan type:thermodilution PA Cath depth:58 Procedure performed without using ultrasound guided technique. Attempts: 1 Patient tolerated the procedure well with no immediate complications.

## 2021-09-16 NOTE — Progress Notes (Signed)
Pt switched over to CPAP/PS at this time.

## 2021-09-16 NOTE — Chronic Care Management (AMB) (Signed)
    Chronic Care Management Pharmacy Assistant   Name: Luis [name]  MRN: [id number] DOB: [birth year]  09/17/2021 APPOINTMENT REMINDER  Patient is currently in cardiovascular ICU.  Appointment cancelled.   [place] Pharmacist Assistant [phone]

## 2021-09-16 NOTE — Anesthesia Procedure Notes (Signed)
Arterial Line Insertion Start/End5/25/2023 7:20 AM, 09/16/2021 7:25 AM Performed by: Santa Lighter, MD  Patient location: Pre-op. Preanesthetic checklist: patient identified, IV checked, site marked, risks and benefits discussed, surgical consent, monitors and equipment checked, pre-op evaluation, timeout performed and anesthesia consent Lidocaine 1% used for infiltration Left, radial was placed Catheter size: 20 G Hand hygiene performed  and maximum sterile barriers used   Attempts: 1 Procedure performed using ultrasound guided technique. Ultrasound Notes:anatomy identified, needle tip was noted to be adjacent to the nerve/plexus identified, no ultrasound evidence of intravascular and/or intraneural injection and image(s) printed for medical record Following insertion, dressing applied and Biopatch. Post procedure assessment: normal and unchanged  Patient tolerated the procedure well with no immediate complications.

## 2021-09-16 NOTE — Transfer of Care (Signed)
Immediate Anesthesia Transfer of Care Note  Patient: Luis [name]  Procedure(s) Performed: AORTIC VALVE REPLACEMENT (AVR) (Chest) REPLACEMENT ASCENDING AORTA WITH 30 X 10MM HEMASHIELD PLATINUM WOVEN DOUBLE VELOUR VASCULAR GRAFT TRANSESOPHAGEAL ECHOCARDIOGRAM (TEE)  Patient Location: SICU  Anesthesia Type:General  Level of Consciousness: Patient remains intubated per anesthesia plan  Airway & Oxygen Therapy: Patient remains intubated per anesthesia plan and Patient placed on Ventilator (see vital sign flow sheet for setting)  Post-op Assessment: Report given to RN and Post -op Vital signs reviewed and stable  Post vital signs: Reviewed and stable  Last Vitals:  Vitals Value Taken Time  BP    Temp    Pulse 80 09/16/21 1450  Resp 17 09/16/21 1450  SpO2 99 % 09/16/21 1450  Vitals shown include unvalidated device data.  Last Pain:  Vitals:   09/16/21 0617  TempSrc:   PainSc: 0-No pain         Complications: No notable events documented.

## 2021-09-16 NOTE — Progress Notes (Signed)
Rapid wean initiated at this time per protocol  

## 2021-09-16 NOTE — Progress Notes (Signed)
CT surgery PM rounds  Patient starting to wake up and respond after AVR-replacement of ascending aneurysm Hemodynamic stable Chest tube output minimal Postoperative labs satisfactory Atrially pacing for underlying sinus bradycardia Lungs clear  Blood pressure 93/66, pulse 80, temperature (!) 97 F (36.1 C), resp. rate 17, height '5\' 6"'$  (1.676 m), weight 125.6 kg, SpO2 100 %.

## 2021-09-16 NOTE — Op Note (Signed)
CARDIOVASCULAR SURGERY OPERATIVE NOTE  09/16/2021  Surgeon:  Gaye Pollack, MD  First Assistant: Ellwood Handler,  PA-C : An experienced assistant was required given the complexity of this surgery and the standard of surgical care. The assistant was needed for exposure, dissection, suctioning, retraction of delicate tissues and sutures, instrument exchange and for overall help during this procedure.    Preoperative Diagnosis:  Severe bicuspid aortic valve stenosis and ascending aortic aneurysm   Postoperative Diagnosis:  Same   Procedure:  Median Sternotomy Extracorporeal circulation 3.   Supra-coronary replacement of the ascending aorta (hemi-arch) using a 30 x 10 mm Hemashield graft under deep hypothermic circulatory arrest 4.   Aortic valve replacement using a 25 mm Edwards INSPIRIS RESILIA pericardial valve   Anesthesia:  General Endotracheal   Clinical History/Surgical Indication:    This 66 year old gentleman has stage D, severe, symptomatic bicuspid aortic valve stenosis with New York Heart Association class III symptoms of exertional fatigue and shortness of breath consistent with chronic diastolic congestive heart failure.  I have personally reviewed his 2D echocardiogram, cardiac catheterization, and CTA studies.  His echocardiogram shows a heavily calcified bicuspid aortic valve with thickened leaflets and restricted mobility.  The mean gradient was 48 mmHg consistent with severe aortic stenosis.  Cardiac catheterization shows no significant coronary stenoses.  I agree that aortic valve replacement is indicated in this patient for relief of his symptoms and to prevent left ventricular dysfunction.  With a heavily calcified bicuspid aortic valve and relatively young age of 19 and a 4.5 cm ascending aortic aneurysm I think that open surgical aortic valve replacement and replacement of his ascending aorta would be the best option for him.  I discussed the alternative of  transcatheter aortic valve replacement and continued medical follow-up of his ascending aortic aneurysm.  I explained the potential difficulties with bicuspid valve morphology and extensive calcification as well as the possibility that his aorta may continue to enlarge and require a more extensive surgery later.  I also discussed the unknown long-term durability of transcatheter aortic valves and our current recommendation for open surgical valve replacement in low risk patients less than 43 years old.  He is in agreement with proceeding with open surgery.  I discussed the alternatives of mechanical and bioprosthetic valves.  Given his age I recommended a bioprosthetic valve and he is in agreement with that. I discussed the operative procedure with the patient and family including alternatives, benefits and risks; including but not limited to bleeding, blood transfusion, infection, stroke, myocardial infarction, graft failure, heart block requiring a permanent pacemaker, organ dysfunction, and death.  [name] understands and agrees to proceed.     Preparation:  The patient was seen in the preoperative holding area and the correct patient, correct operation were confirmed with the patient after reviewing the medical record and catheterization. The consent was signed by me. Preoperative antibiotics were given. A pulmonary arterial line and radial arterial line were placed by the anesthesia team. The patient was taken back to the operating room and positioned supine on the operating room table. After being placed under general endotracheal anesthesia by the anesthesia team a foley catheter was placed. The neck, chest, abdomen, and both legs were prepped with betadine soap and solution and draped in the usual sterile manner. A surgical time-out was taken and the correct patient and operative procedure were confirmed with the nursing and anesthesia staff.  TEE:  Performed by Dr. Gifford Shave. This showed a  bicuspid aortic  valve with severe AS, normal LV systolic function.   Cardiopulmonary Bypass:  A median sternotomy was performed. The pericardium was opened in the midline. Right ventricular function appeared normal. The ascending aorta was of normal size and had no palpable plaque. There were no contraindications to aortic cannulation or cross-clamping. The patient was fully systemically heparinized and the ACT was maintained > 400 sec. The distal ascending aorta was cannulated with a 51 F aortic cannula for arterial inflow. Venous cannulation was performed via the right atrial appendage using a two-staged venous cannula. A left ventricular vent was placed via the right superior pulmonary vein. Systemic cooling was begun with a goal temperature of 18 degrees centigrade by bladder and rectal temperature probes. A retrograde cardioplegia cannula was placed through the right atrium into the coronary sinus without difficulty. A retrograde cerebral perfusion cannula was placed into the SVC through a pursestring suture and the SVC was encircled with a silastic tape. Aortic occlusion was performed with a single cross-clamp. Topical cooling of the heart with iced saline was used. Cold antegrade KBC cardioplegia was used to induce diastolic arrest and was then given retrograde at about 60 minute intervals throughout the period of arrest to maintain myocardial temperature at or below 10 degrees centigrade. A temperature probe was inserted into the interventricular septum and an insulating pad was placed in the pericardium. CO2 was insufflated into the pericardium throughout the case to minimize intracardiac air.    Resection and grafting of ascending aortic aneurysm:  After 30 minutes of cooling the target temperature of 18 degrees centigrade was reached. Cerebral oximetry was 70% bilaterally. BIS was zero. The patient was given Propofol and 125 mg of Solumedrol. The head was packed in ice. The bed was placed in  steep trendelenburg. Circulatory arrest was begun and the blood volume emptied into the venous reservoir. Continuous retrograde cerebral perfusion was begun and the SVC occluded with the silastic tape. The aortic cannula was removed. The aorta was transected just proximal to the innominate artery beveling the resection out along the undersurface of the aortic arch (Hemiarch replacement). The aortic diameter was measured at 30 mm here. A 30 x 10 mm Hemasheild Platinum vascular graft was prepared. ( Catalog # [id number] P0, Lot [id number], SN [id number]). It was anastomosed to the aortic arch in an end to end manner using 3-0 prolene continuous suture with a felt strip to reinforce the anastomisis. A light coating of BioGlue was applied to seal needle holes. The arterial end of the bypass circuit was then connected to the 29m side arm graft and circulation was slowly resumed. The tape was removed from the SVC. The aortic graft was cross-clamped proximal to the side arm graft and full CPB support was resumed. Circulatory arrest time was 24 minutes.   Aortic Valve Replacement:   A transverse aortotomy was performed 1 cm above the take-off of the right coronary artery. The native valve was a type 1 bicuspid with fusion of the left and right cusps. There were severely calcified leaflets and severe annular calcification. The ostia of the coronary arteries were in normal position and were not obstructed. The native valve leaflets were excised and the annulus was decalcified with rongeurs. Care was taken to remove all particulate debris. The left ventricle was directly inspected for debris and then irrigated with ice saline solution. The annulus was sized and a size 238mEdwards INSPIRIS RESILIA pericardial valve was chosen. The model number was [id number] and the serial number was [id number]  Ethibond pledgeted horizontal mattress sutures were placed around the annulus with the pledgets in a sub-annular position. The  sutures were placed through the sewing ring and the valve lowered into place. The sutures were tied using CorKnots. The valve seated nicely and the coronary ostia were not obstructed. The prosthetic valve leaflets moved normally and there was no sub-valvular obstruction.   The aortic graft was then cut to the appropriate length and anastomosed end to end to the supra-coronary aorta using continuous 3-0 prolene suture with a felt strip for reinforcement.  BioGlue was applied to seal the needle holes in the grafts. A vent cannula was placed into the graft to remove any air. Deairing maneuvers were performed and the bed placed in trendelenburg position.   Completion:   The patient was rewarmed to 37 degrees Centigrade. The crossclamp was removed with a time of 122 minutes. There was spontaneous return of sinus rhythm. The position of the graft was satisfactory. Two temporary epicardial pacing wires were placed on the right atrium and two on the right ventricle. The patient was weaned from CPB without difficulty on no inotropic agents. CPB time was 164 minutes. Cardiac output was 6 LPM. TEE showed a normal functioning aortic valve prosthesis with no AI.  LV function appeared normal. Heparin was fully reversed with protamine and the venous cannula removed. The aortic sidearm graft was ligated with a heavy silk tie and suture ligated with pledgetted 3-0 prolene suture. The patient remained coagulopathic after protamine was given and was not clotting. Platelets were 100K, fibrinogen was normal. I gave 2 units of FFP and 1 unit of plts and there was still bleeding from needle holes at the anastomoses and poor clot structure. Therefore a half dose of NovoSeven was given. Hemostasis was achieved rapidly after that.  Mediastinal drainage tubes were placed. The sternum was closed with double #6 stainless steel wires. The fascia was closed with continuous # 1 vicryl suture. The subcutaneous tissue was closed with 2-0  vicryl continuous suture. The skin was closed with 3-0 vicryl subcuticular suture. All sponge, needle, and instrument counts were reported correct at the end of the case. Dry sterile dressings were placed over the incisions and around the chest tubes which were connected to pleurevac suction. The patient was then transported to the surgical intensive care unit in stable condition.

## 2021-09-16 NOTE — Anesthesia Procedure Notes (Signed)
Central Venous Catheter Insertion Performed by: Santa Lighter, MD, anesthesiologist Start/End5/25/2023 7:05 AM, 09/16/2021 7:15 AM Patient location: Pre-op. Preanesthetic checklist: patient identified, IV checked, site marked, risks and benefits discussed, surgical consent, monitors and equipment checked, pre-op evaluation, timeout performed and anesthesia consent Position: Trendelenburg Lidocaine 1% used for infiltration and patient sedated Hand hygiene performed , maximum sterile barriers used  and Seldinger technique used Catheter size: 9 Fr Central line was placed.MAC introducer Procedure performed using ultrasound guided technique. Ultrasound Notes:anatomy identified, needle tip was noted to be adjacent to the nerve/plexus identified, no ultrasound evidence of intravascular and/or intraneural injection and image(s) printed for medical record Attempts: 1 Following insertion, line sutured, dressing applied and Biopatch. Post procedure assessment: free fluid flow, blood return through all ports and no air  Patient tolerated the procedure well with no immediate complications.

## 2021-09-16 NOTE — Brief Op Note (Signed)
09/16/2021  8:06 AM  PATIENT:  Luis [name]  66 y.o. male  PRE-OPERATIVE DIAGNOSIS:  SEVERE AS TAA  POST-OPERATIVE DIAGNOSIS:  * No post-op diagnosis entered *  PROCEDURE:  Procedure(s) with comments:  AORTIC VALVE REPLACEMENT (AVR) (N/A) -25 mm Edwards Resilia Bioprosthetic Valve  REPLACEMENT ASCENDING AORTA WITH 30 X 10MM HEMASHIELD PLATINUM WOVEN DOUBLE VELOUR VASCULAR GRAFT (N/A) - CIRC ARREST  TRANSESOPHAGEAL ECHOCARDIOGRAM (TEE) (N/A)  SURGEON:  Surgeon(s) and Role:    * Gaye Pollack, MD - Primary  PHYSICIAN ASSISTANT: Ellwood Handler PA-C  ASSISTANTS: Despina Arias RNFA, Ardyth Man RNFA   ANESTHESIA:   general  EBL:  Per Anesthesia Record  BLOOD ADMINISTERED:   CC CELLSAVER  DRAINS:  Mediastinal Chest Drains    LOCAL MEDICATIONS USED:  NONE  SPECIMEN:  Source of Specimen:  Aortic Valve Leaflets, Ascending Aorta  DISPOSITION OF SPECIMEN:  PATHOLOGY  COUNTS:  YES  TOURNIQUET:  * No tourniquets in log *  DICTATION: .Dragon Dictation  PLAN OF CARE: Admit to inpatient   PATIENT DISPOSITION:  ICU - intubated and hemodynamically stable.   Delay start of Pharmacological VTE agent (>24hrs) due to surgical blood loss or risk of bleeding: yes

## 2021-09-16 NOTE — Procedures (Signed)
Extubation Procedure Note  Patient Details:   Name: Luis [name] DOB: 1955/11/02 MRN: [id number]   Airway Documentation:    Vent end date: 09/16/21 Vent end time: 2100   Evaluation  O2 sats: stable throughout Complications: No apparent complications Patient did tolerate procedure well. Bilateral Breath Sounds: Clear   Yes  Pt extubated to 4L [place]  per protocol.  NIF: -25 VC: 1.1L IS: 1046m best of three attempts.  Positive cuff leak noted, pt able to say name after extubation, no stridor noted. Pt tolerated well.   [name]5/25/2023, 9:07 PM

## 2021-09-17 ENCOUNTER — Telehealth: Payer: PPO

## 2021-09-17 ENCOUNTER — Other Ambulatory Visit: Payer: Self-pay | Admitting: Student

## 2021-09-17 ENCOUNTER — Encounter (HOSPITAL_COMMUNITY): Payer: Self-pay | Admitting: Surgery

## 2021-09-17 ENCOUNTER — Inpatient Hospital Stay (HOSPITAL_COMMUNITY): Payer: PPO

## 2021-09-17 DIAGNOSIS — Z952 Presence of prosthetic heart valve: Secondary | ICD-10-CM

## 2021-09-17 LAB — BASIC METABOLIC PANEL
Anion gap: 6 (ref 5–15)
Anion gap: 7 (ref 5–15)
BUN: 20 mg/dL (ref 8–23)
BUN: 25 mg/dL — ABNORMAL HIGH (ref 8–23)
CO2: 22 mmol/L (ref 22–32)
CO2: 23 mmol/L (ref 22–32)
Calcium: 7.1 mg/dL — ABNORMAL LOW (ref 8.9–10.3)
Calcium: 7 mg/dL — ABNORMAL LOW (ref 8.9–10.3)
Chloride: 107 mmol/L (ref 98–111)
Chloride: 105 mmol/L (ref 98–111)
Creatinine, Ser: 1.01 mg/dL (ref 0.61–1.24)
Creatinine, Ser: 1.24 mg/dL (ref 0.61–1.24)
GFR, Estimated: >60 mL/min (ref >60)
GFR, Estimated: >60 mL/min (ref >60)
Glucose, Bld: 149 mg/dL — ABNORMAL HIGH (ref 70–99)
Glucose, Bld: 198 mg/dL — ABNORMAL HIGH (ref 70–99)
Potassium: 4.4 mmol/L (ref 3.5–5.1)
Potassium: 4 mmol/L (ref 3.5–5.1)
Sodium: 135 mmol/L (ref 135–145)
Sodium: 135 mmol/L (ref 135–145)

## 2021-09-17 LAB — BPAM PLATELET PHERESIS
Blood Product Expiration Date: 202305262359
ISSUE DATE / TIME: 202305251216
Unit Type and Rh: 6200

## 2021-09-17 LAB — ECHO INTRAOPERATIVE TEE
AR max vel: 1.44 cm2
AV Area VTI: 1.19 cm2
AV Area mean vel: 1.4 cm2
AV Mean grad: 35 mmHg
AV Peak grad: 54 mmHg
Ao pk vel: 3.67 m/s
Height: 66 in
Weight: 4432 oz

## 2021-09-17 LAB — PREPARE FRESH FROZEN PLASMA

## 2021-09-17 LAB — BPAM FFP
Blood Product Expiration Date: 202305302359
Blood Product Expiration Date: 202305302359
ISSUE DATE / TIME: 202305251235
ISSUE DATE / TIME: 202305251235
Unit Type and Rh: 600
Unit Type and Rh: 6200

## 2021-09-17 LAB — CBC
HCT: 26.1 % — ABNORMAL LOW (ref 39.0–52.0)
HCT: 22.7 % — ABNORMAL LOW (ref 39.0–52.0)
Hemoglobin: 8.6 g/dL — ABNORMAL LOW (ref 13.0–17.0)
Hemoglobin: 7.5 g/dL — ABNORMAL LOW (ref 13.0–17.0)
MCH: 29.9 pg (ref 26.0–34.0)
MCH: 30.6 pg (ref 26.0–34.0)
MCHC: 33 g/dL (ref 30.0–36.0)
MCHC: 33 g/dL (ref 30.0–36.0)
MCV: 90.6 fL (ref 80.0–100.0)
MCV: 92.7 fL (ref 80.0–100.0)
Platelets: 146 10*3/uL — ABNORMAL LOW (ref 150–400)
Platelets: 109 10*3/uL — ABNORMAL LOW (ref 150–400)
RBC: 2.88 MIL/uL — ABNORMAL LOW (ref 4.22–5.81)
RBC: 2.45 MIL/uL — ABNORMAL LOW (ref 4.22–5.81)
RDW: 14.1 % (ref 11.5–15.5)
RDW: 14.4 % (ref 11.5–15.5)
WBC: 13.5 10*3/uL — ABNORMAL HIGH (ref 4.0–10.5)
WBC: 12.9 10*3/uL — ABNORMAL HIGH (ref 4.0–10.5)
nRBC: 0 % (ref 0.0–0.2)
nRBC: 0 % (ref 0.0–0.2)

## 2021-09-17 LAB — PREPARE PLATELET PHERESIS: Unit division: 0

## 2021-09-17 LAB — POCT I-STAT, CHEM 8
BUN: 18 mg/dL (ref 8–23)
Calcium, Ion: 1.05 mmol/L — ABNORMAL LOW (ref 1.15–1.40)
Chloride: 103 mmol/L (ref 98–111)
Creatinine, Ser: 0.8 mg/dL (ref 0.61–1.24)
Glucose, Bld: 134 mg/dL — ABNORMAL HIGH (ref 70–99)
HCT: 29 % — ABNORMAL LOW (ref 39.0–52.0)
Hemoglobin: 9.9 g/dL — ABNORMAL LOW (ref 13.0–17.0)
Potassium: 4.1 mmol/L (ref 3.5–5.1)
Sodium: 139 mmol/L (ref 135–145)
TCO2: 26 mmol/L (ref 22–32)

## 2021-09-17 LAB — SURGICAL PATHOLOGY

## 2021-09-17 LAB — GLUCOSE, CAPILLARY
Glucose-Capillary: 137 mg/dL — ABNORMAL HIGH (ref 70–99)
Glucose-Capillary: 141 mg/dL — ABNORMAL HIGH (ref 70–99)
Glucose-Capillary: 141 mg/dL — ABNORMAL HIGH (ref 70–99)
Glucose-Capillary: 145 mg/dL — ABNORMAL HIGH (ref 70–99)
Glucose-Capillary: 146 mg/dL — ABNORMAL HIGH (ref 70–99)
Glucose-Capillary: 156 mg/dL — ABNORMAL HIGH (ref 70–99)
Glucose-Capillary: 164 mg/dL — ABNORMAL HIGH (ref 70–99)
Glucose-Capillary: 168 mg/dL — ABNORMAL HIGH (ref 70–99)
Glucose-Capillary: 212 mg/dL — ABNORMAL HIGH (ref 70–99)

## 2021-09-17 LAB — MAGNESIUM
Magnesium: 2.7 mg/dL — ABNORMAL HIGH (ref 1.7–2.4)
Magnesium: 2.7 mg/dL — ABNORMAL HIGH (ref 1.7–2.4)

## 2021-09-17 MED ORDER — KETOROLAC TROMETHAMINE 15 MG/ML IJ SOLN
15.0000 mg | Freq: Four times a day (QID) | INTRAMUSCULAR | Status: DC | PRN
  Administered 2021-09-17 – 2021-09-18 (×3): 15 mg via INTRAVENOUS
  Filled 2021-09-17 (×3): qty 1

## 2021-09-17 MED ORDER — INSULIN ASPART 100 UNIT/ML IJ SOLN
0.0000 [IU] | INTRAMUSCULAR | Status: DC
  Administered 2021-09-17: 2 [IU] via SUBCUTANEOUS
  Administered 2021-09-17: 8 [IU] via SUBCUTANEOUS
  Administered 2021-09-17: 2 [IU] via SUBCUTANEOUS
  Administered 2021-09-18: 4 [IU] via SUBCUTANEOUS
  Administered 2021-09-18 – 2021-09-19 (×5): 2 [IU] via SUBCUTANEOUS

## 2021-09-17 MED ORDER — ENOXAPARIN SODIUM 40 MG/0.4ML IJ SOSY
40.0000 mg | PREFILLED_SYRINGE | Freq: Every day | INTRAMUSCULAR | Status: DC
  Administered 2021-09-17 – 2021-09-18 (×2): 40 mg via SUBCUTANEOUS
  Filled 2021-09-17 (×2): qty 0.4

## 2021-09-17 MED ORDER — FUROSEMIDE 10 MG/ML IJ SOLN
40.0000 mg | Freq: Once | INTRAMUSCULAR | Status: AC
  Administered 2021-09-17: 40 mg via INTRAVENOUS
  Filled 2021-09-17: qty 4

## 2021-09-17 MED FILL — Mannitol IV Soln 20%: INTRAVENOUS | Qty: 500 | Status: AC

## 2021-09-17 MED FILL — Electrolyte-R (PH 7.4) Solution: INTRAVENOUS | Qty: 3000 | Status: AC

## 2021-09-17 MED FILL — Sodium Chloride IV Soln 0.9%: INTRAVENOUS | Qty: 2000 | Status: AC

## 2021-09-17 MED FILL — Lidocaine HCl Local Soln Prefilled Syringe 100 MG/5ML (2%): INTRAMUSCULAR | Qty: 5 | Status: AC

## 2021-09-17 MED FILL — Sodium Bicarbonate IV Soln 8.4%: INTRAVENOUS | Qty: 50 | Status: AC

## 2021-09-17 MED FILL — Lidocaine HCl Local Preservative Free (PF) Inj 2%: INTRAMUSCULAR | Qty: 15 | Status: AC

## 2021-09-17 MED FILL — Heparin Sodium (Porcine) Inj 1000 Unit/ML: Qty: 1000 | Status: AC

## 2021-09-17 MED FILL — Potassium Chloride Inj 2 mEq/ML: INTRAVENOUS | Qty: 40 | Status: AC

## 2021-09-17 NOTE — Anesthesia Postprocedure Evaluation (Signed)
Anesthesia Post Note  Patient: Luis [name]  Procedure(s) Performed: AORTIC VALVE REPLACEMENT (AVR) (Chest) REPLACEMENT ASCENDING AORTA WITH 30 X 10MM HEMASHIELD PLATINUM WOVEN DOUBLE VELOUR VASCULAR GRAFT TRANSESOPHAGEAL ECHOCARDIOGRAM (TEE)     Patient location during evaluation: SICU Anesthesia Type: General Level of consciousness: sedated Pain management: pain level controlled Vital Signs Assessment: post-procedure vital signs reviewed and stable Respiratory status: patient remains intubated per anesthesia plan Cardiovascular status: stable Postop Assessment: no apparent nausea or vomiting Anesthetic complications: no   No notable events documented.  Last Vitals:  Vitals:   09/17/21 1950 09/17/21 2000  BP:  (!) 104/56  Pulse:  70  Resp:  17  Temp: 37.1 C   SpO2:  100%    Last Pain:  Vitals:   09/17/21 1950  TempSrc: Axillary  PainSc:                  Santa Lighter

## 2021-09-17 NOTE — Progress Notes (Signed)
Pt already on CPAP and resting. RT will cont to monitor as needed.

## 2021-09-17 NOTE — Discharge Summary (Signed)
[address]       [place] [address]             [phone]    Physician Discharge Summary  Patient ID: Luis [name] MRN: [id number] DOB/AGE: 68-Feb-1957 68 y.o.  Admit date: 09/16/2021 Discharge date: 09/23/2021  Admission Diagnoses:  Patient Active Problem List    Date Noted   Morbid obesity ([place]) 02/29/2016   Essential hypertension    Ascending aortic aneurysm (HCC)    Rhinitis, chronic 01/14/2015   Generalized anxiety disorder 01/14/2015   Aortic stenosis, moderate 01/02/2015   Chronic diastolic CHF (congestive heart failure) ([place]) 06/30/2014   OSA (obstructive sleep apnea) 02/13/2012   Fatigue 01/09/2012   Gall bladder disease 06/16/2011   Hyperlipidemia 02/28/2011   Angina of effort ([place]) 12/08/2010   CAD (coronary artery disease) 12/08/2010   Tobacco abuse 12/08/2010   Aortic stenosis 12/08/2010   ANAL FISSURE 04/23/2009   INTERNAL HEMORRHOIDS 05/22/2008   COLONIC POLYPS 02/07/2008   CAROTID ARTERY STENOSIS 02/07/2008   Anxiety state 01/09/2007   Discharge Diagnoses:  Patient Active Problem List   Diagnosis Date Noted   S/P AVR 09/16/2021   Morbid obesity ([place]) 02/29/2016   Essential hypertension    Ascending aortic aneurysm (HCC)    Rhinitis, chronic 01/14/2015   Generalized anxiety disorder 01/14/2015   Aortic stenosis, moderate 01/02/2015   Chronic diastolic CHF (congestive heart failure) ([place]) 06/30/2014   OSA (obstructive sleep apnea) 02/13/2012   Fatigue 01/09/2012   Gall bladder disease 06/16/2011   Hyperlipidemia 02/28/2011   Angina of effort ([place]) 12/08/2010   CAD (coronary artery disease) 12/08/2010   Tobacco abuse 12/08/2010   Aortic stenosis 12/08/2010   ANAL FISSURE 04/23/2009   INTERNAL HEMORRHOIDS 05/22/2008   COLONIC POLYPS 02/07/2008   CAROTID ARTERY STENOSIS 02/07/2008   Anxiety state 01/09/2007   Discharged Condition: good  History of Present Illness:  Luis [name] is a 66 year old gentleman with a  history of hypertension, hyperlipidemia, coronary artery disease status post PCI of the RCA in 2012 with DES,  bladder cancer that has been treated with TURBT and chemotherapy with BCG, and moderate aortic stenosis.  His most recent echo on 07/01/2021 showed an increase in the mean gradient to 48 mmHg with a peak gradient of 76 mmHg.  Valve area by VTI was 0.91 cm.  The valve appeared bicuspid.  The ascending aorta was measured at 4.5 cm.  Left ventricular ejection fraction was 60 to 65% with mild concentric LVH and grade 1 diastolic dysfunction.  Cardiac catheterization on 07/08/2021 showed patent stents in the RCA and otherwise no significant disease.  He was referred to [facility] for surgical consultation.  He was evaluated by Luis [name] at which time he continued to have exertional shortness of breath and fatigue.  He denies any chest pain or pressure.  He denies dizziness and syncope.  He has had no orthopnea or PND.  He denies peripheral edema.  His activity has primarily been limited by degenerative arthritis in his knees.  He had initially undergone workup for TAVR, however he was noted to have an enlarged Aorta on his CTA of the chest.  Due to this it was recommended the patient undergo Aortic Valve Replacement with replacement of his Ascending Aorta.  The risks and benefits of the procedure were explained to the patient and he was agreeable to proceed.  Hospital Course:  Luis [name] presented to [facility] on 09/16/2021.  He was taken to the operating room and underwent Aortic Valve Replacement with a 25 mm Edwards Resilia Aortic Valve and Replacement of Ascending Aorta under hypothermic circulatory arrest.  He tolerated the procedure without difficulty and was taken to the SICU in stable condition.  The patient was extubated the evening of surgery without difficulty.  He was weaned of Neo-synephrine as hemodynamics allowed.  His chest tubes and arterial lines were  removed without difficulty.  He was maintaining NSR and was transferred to the progressive care unit on 09/18/2021.  The patient developed Atrial Fibrillation with RVR.  He was treated with IV Amiodarone protocol and did not convert.  He required additional Amiodarone bolus therapy.  He was unable to start BB therapy due to underlying hypotension.  Coumadin was initiated to decrease the risk of stroke.  He was anemic with Hgb of 7.1 he was transfused 2 units of packed cells with improvement of Hgb to 7.4.  The patient was having difficulty with IV access and ability to obtain blood samples and PICC line was placed.  He was volume overloaded and treated with IV Lasix as blood pressure allowed.  He developed testicular pain that was radiating from his back.  He does have a history of cancer and is due to follow up with Urology.  Cardiology consult was obtained and recommended addition of digoxin.  The patient converted to NSR.  He was not started on coumadin due to increase risk of bleeding with development of clots/bleeding from previous bladder cancer.  The patient was hypokalemic due to aggressive diuretics.  He was supplemented accordingly and his potassium level improved to 3.4.  His blood pressure improved and he was restarted on his home Coreg at a reduced dose.  He is ambulating independently.  His surgical incisions are healing without evidence of infection.  He is medically stable for discharge home today.  Consults: cardiology  Significant Diagnostic Studies: cardiac graphics:   Echocardiogram:    IMPRESSIONS    1. Left ventricular ejection fraction, by estimation, is 60 to 65%. The  left ventricle has normal function. The left ventricle has no regional  wall motion abnormalities. There is mild concentric left ventricular  hypertrophy. Left ventricular diastolic  parameters are consistent with Grade I diastolic dysfunction (impaired  relaxation).   2. Right ventricular systolic function is  normal. The right ventricular  size is normal. Tricuspid regurgitation signal is inadequate for assessing  PA pressure.   3. The mitral valve is normal in structure. No evidence of mitral valve  regurgitation. No evidence of mitral stenosis.   4. The aortic valve is bicuspid. There is severe calcifcation of the  aortic valve. Aortic valve regurgitation is not visualized. Severe aortic  valve stenosis. Aortic valve area, by VTI measures 0.91 cm. Aortic valve  mean gradient measures 48.0 mmHg.   5. Aortic dilatation noted. There is moderate dilatation of the ascending  aorta, measuring 45 mm.   Treatments: surgery:   09/16/2021   Surgeon:  Gaye Pollack, MD   First Assistant: Ellwood Handler,  PA-C : An experienced assistant was required given the complexity of this surgery and the standard of surgical care. The assistant was needed for exposure, dissection, suctioning, retraction of delicate tissues and sutures, instrument exchange and for overall help during this procedure.      Preoperative Diagnosis:  Severe bicuspid aortic valve stenosis and ascending aortic aneurysm    Postoperative Diagnosis:  Same    Procedure:   Median Sternotomy  Extracorporeal circulation 3.   Supra-coronary replacement of the ascending aorta (hemi-arch) using a 30 x 10 mm Hemashield graft under deep hypothermic circulatory arrest 4.   Aortic valve replacement using a 25 mm Edwards INSPIRIS RESILIA pericardial valve   Discharge Exam: Blood pressure 110/70, pulse 70, temperature 98 F (36.7 C), temperature source Oral, resp. rate 17, height '5\' 6"'$  (1.676 m), weight 126.1 kg, SpO2 99 %.  General appearance: alert, cooperative, and no distress Heart: regular rate and rhythm Lungs: clear to auscultation bilaterally Abdomen: soft, non-tender; bowel sounds normal; no masses,  no organomegaly Extremities: edema trace Wound: clean and dry  Discharge Medications:  The patient has been discharged  on:   1.Beta Blocker:  Yes [ x  ]                              No   [   ]                              If No, reason:  2.Ace Inhibitor/ARB: Yes [   ]                                     No  [ x   ]                                     If No, reason: labile BP  3.Statin:   Yes [ x  ]                  No  [   ]                  If No, reason:  4.Ecasa:  Yes  [ x  ]                  No   [   ]                  If No, reason:  Patient had ACS upon admission: No  Plavix/P2Y12 inhibitor: Yes [   ]                                      No  [ X  ]    Allergies as of 09/23/2021       Reactions   Oxycodone    insomnia        Medication List     STOP taking these medications    losartan 50 MG tablet Commonly known as: COZAAR       TAKE these medications    acetaminophen 500 MG tablet Commonly known as: TYLENOL Take 1-2 tablets (500-1,000 mg total) by mouth every 6 (six) hours as needed for moderate pain. What changed: how much to take   amiodarone 200 MG tablet Commonly known as: PACERONE Take 2 tablets (400 mg total) by mouth 2 (two) times daily. X 7 days, then decrease to 200 mg BID x 7 days, then decrease to 200 mg daily   aspirin 81 MG tablet Take 81 mg by mouth every morning.   carvedilol 3.125  MG tablet Commonly known as: COREG Take 1 tablet (3.125 mg total) by mouth 2 (two) times daily with a meal. What changed:  medication strength how much to take when to take this   digoxin 0.25 MG tablet Commonly known as: LANOXIN Take 1 tablet (0.25 mg total) by mouth daily.   furosemide 40 MG tablet Commonly known as: LASIX TAKE 1 TABLET (40 MG TOTAL) BY MOUTH DAILY AND 1/2 TABLETS (20 MG TOTAL) EVERY EVENING. What changed:  how much to take how to take this when to take this   ipratropium 0.03 % nasal spray Commonly known as: ATROVENT Place 2 sprays into both nostrils every 12 (twelve) hours as needed for rhinitis.   MAGNESIUM PO Take 500 mg by mouth  daily.   meclizine 25 MG tablet Commonly known as: ANTIVERT Take 1 tablet (25 mg total) by mouth 3 (three) times daily as needed for dizziness.   multivitamin-iron-minerals-folic acid chewable tablet Chew 1 tablet by mouth daily.   nitroGLYCERIN 0.4 MG SL tablet Commonly known as: NITROSTAT Place 1 tablet (0.4 mg total) under the tongue every 5 (five) minutes as needed. For chest pain.   potassium chloride SA 20 MEQ tablet Commonly known as: KLOR-CON M Take 2 tablets (40 mEq total) by mouth daily.   rosuvastatin 20 MG tablet Commonly known as: CRESTOR TAKE 1 TABLET BY MOUTH EVERYDAY AT BEDTIME What changed: See the new instructions.   tamsulosin 0.4 MG Caps capsule Commonly known as: FLOMAX Take 0.4 mg by mouth daily.   traMADol 50 MG tablet Commonly known as: ULTRAM Take 1-2 tablets (50-100 mg total) by mouth every 4 (four) hours as needed for moderate pain.   venlafaxine XR 150 MG 24 hr capsule Commonly known as: EFFEXOR-XR TAKE 1 CAPSULE BY MOUTH DAILY WITH BREAKFAST.        Follow-up Information     Larey Dresser, MD Follow up.   Specialty: Cardiology Why: Follow-up with Dr. Aundra Dubin scheduled for 09/30/2021 at 10:20am. Contact information: [place] [address] [phone]         [place] [facility] Follow up.   Specialty: Cardiology Why: Post-op Echo scheduled for 10/29/2021 at 8:20am at our [facility]. Contact information: [address], [place] [phone]        Gaye Pollack, MD Follow up on 10/20/2021.   Specialty: Cardiothoracic Surgery Why: Appointment is at 9:00, please get CXR at 8:30 at [facility] [place] located on first floor of our office building Contact information: [address] [place] [address] [phone]                 Signed:  Ellwood Handler, PA-C  09/23/2021, 7:37 AM

## 2021-09-17 NOTE — Plan of Care (Signed)
  Problem: Education: Goal: Knowledge of General Education information will improve Description: Including pain rating scale, medication(s)/side effects and non-pharmacologic comfort measures Outcome: Progressing   Problem: Health Behavior/Discharge Planning: Goal: Ability to manage health-related needs will improve Outcome: Progressing   Problem: Clinical Measurements: Goal: Ability to maintain clinical measurements within normal limits will improve Outcome: Progressing Goal: Will remain free from infection Outcome: Progressing Goal: Diagnostic test results will improve Outcome: Progressing Goal: Respiratory complications will improve Outcome: Progressing Goal: Cardiovascular complication will be avoided Outcome: Progressing   Problem: Activity: Goal: Risk for activity intolerance will decrease Outcome: Progressing   Problem: Nutrition: Goal: Adequate nutrition will be maintained Outcome: Progressing   Problem: Coping: Goal: Level of anxiety will decrease Outcome: Progressing   Problem: Elimination: Goal: Will not experience complications related to bowel motility Outcome: Progressing Goal: Will not experience complications related to urinary retention Outcome: Progressing   Problem: Pain Managment: Goal: General experience of comfort will improve Outcome: Progressing   Problem: Safety: Goal: Ability to remain free from injury will improve Outcome: Progressing   Problem: Skin Integrity: Goal: Risk for impaired skin integrity will decrease Outcome: Progressing   Problem: Education: Goal: Will demonstrate proper wound care and an understanding of methods to prevent future damage Outcome: Progressing Goal: Knowledge of disease or condition will improve Outcome: Progressing Goal: Knowledge of the prescribed therapeutic regimen will improve Outcome: Progressing Goal: Individualized Educational Video(s) Outcome: Progressing

## 2021-09-17 NOTE — Progress Notes (Signed)
Ordered 6 week post-op Echo following AVR per protocol. Primary Cardiologist: Dr. Aundra Dubin CT Surgeon: Dr. Everardo Beals, PA-C 09/17/2021 9:03 AM

## 2021-09-17 NOTE — Progress Notes (Signed)
EVENING ROUNDS NOTE :     [address]       [place] [address]             [phone]                 1 Day Post-Op Procedure(s) (LRB): AORTIC VALVE REPLACEMENT (AVR) (N/A) REPLACEMENT ASCENDING AORTA WITH 30 X 10MM HEMASHIELD PLATINUM WOVEN DOUBLE VELOUR VASCULAR GRAFT (N/A) TRANSESOPHAGEAL ECHOCARDIOGRAM (TEE) (N/A)   Total Length of Stay:  LOS: 1 day  Events:   No events Resting comfortably    BP (!) 113/54 (BP Location: Left Arm)   Pulse 73   Temp 98.4 F (36.9 C) (Oral)   Resp 19   Ht '5\' 6"'$  (1.676 m)   Wt 130 kg   SpO2 97%   BMI 46.26 kg/m   PAP: (19-45)/(14-32) 28/20 CO:  [5.2 L/min-6.9 L/min] 6.9 L/min CI:  [2.2 L/min/m2-4.6 L/min/m2] 3 L/min/m2  Vent Mode: CPAP;PSV FiO2 (%):  [40 %-50 %] 40 % Set Rate:  [4 bmp-12 bmp] 4 bmp Vt Set:  [510 mL] 510 mL PEEP:  [5 cmH20] 5 cmH20 Pressure Support:  [5 cmH20-10 cmH20] 5 cmH20 Plateau Pressure:  [15 cmH20] 15 cmH20   sodium chloride Stopped (09/16/21 2017)   sodium chloride     sodium chloride      ceFAZolin (ANCEF) IV Stopped (09/17/21 1441)   lactated ringers     lactated ringers 20 mL/hr at 09/17/21 1500   nitroGLYCERIN     phenylephrine (NEO-SYNEPHRINE) Adult infusion 15 mcg/min (09/17/21 1613)    I/O last 3 completed shifts: In: 7614.4 [I.V.:4497.5; HYQMV:7846; IV NGEXBMWUX:3244] Out: 2045 [Urine:1905; Chest Tube:140]      Latest Ref Rng & Units 09/17/2021    4:00 AM 09/16/2021   10:04 PM 09/16/2021    9:21 PM  CBC  WBC 4.0 - 10.5 K/uL 13.5    11.6    Hemoglobin 13.0 - 17.0 g/dL 8.6   8.2   8.6    Hematocrit 39.0 - 52.0 % 26.1   24.0   26.8    Platelets 150 - 400 K/uL 146    126         Latest Ref Rng & Units 09/17/2021    4:00 AM 09/16/2021   10:04 PM 09/16/2021    9:21 PM  BMP  Glucose 70 - 99 mg/dL 149    201    BUN 8 - 23 mg/dL 20    18    Creatinine 0.61 - 1.24 mg/dL 1.01    0.99    Sodium 135 - 145 mmol/L 135   138   137    Potassium 3.5 - 5.1 mmol/L 4.4   4.4    4.5    Chloride 98 - 111 mmol/L 107    108    CO2 22 - 32 mmol/L 22    22    Calcium 8.9 - 10.3 mg/dL 7.1    6.9      ABG    Component Value Date/Time   PHART 7.351 09/16/2021 2204   PCO2ART 42.8 09/16/2021 2204   PO2ART 118 (H) 09/16/2021 2204   HCO3 23.8 09/16/2021 2204   TCO2 25 09/16/2021 2204   ACIDBASEDEF 2.0 09/16/2021 2204   O2SAT 98 09/16/2021 2204       Melodie Bouillon, MD 09/17/2021 5:23 PM

## 2021-09-17 NOTE — Discharge Instructions (Signed)

## 2021-09-17 NOTE — Progress Notes (Signed)
1 Day Post-Op Procedure(s) (LRB): AORTIC VALVE REPLACEMENT (AVR) (N/A) REPLACEMENT ASCENDING AORTA WITH 30 X 10MM HEMASHIELD PLATINUM WOVEN DOUBLE VELOUR VASCULAR GRAFT (N/A) TRANSESOPHAGEAL ECHOCARDIOGRAM (TEE) (N/A) Subjective: Sore but otherwise feels ok  Objective: Vital signs in last 24 hours: Temp:  [96.4 F (35.8 C)-99 F (37.2 C)] 99 F (37.2 C) (05/26 0600) Pulse Rate:  [53-81] 80 (05/26 0600) Cardiac Rhythm: Atrial paced (05/25 2000) Resp:  [10-18] 11 (05/26 0600) BP: (85-140)/(60-75) 118/73 (05/26 0600) SpO2:  [95 %-100 %] 97 % (05/26 0600) Arterial Line BP: (80-137)/(43-79) 110/48 (05/26 0600) FiO2 (%):  [40 %-50 %] 40 % (05/25 2030) Weight:  [130 kg] 130 kg (05/26 0500)  Hemodynamic parameters for last 24 hours: PAP: (19-38)/(9-27) 24/20 CO:  [4.2 L/min-6.9 L/min] 6.9 L/min CI:  [1.8 L/min/m2-4.6 L/min/m2] 3 L/min/m2  Intake/Output from previous day: 05/25 0701 - 05/26 0700 In: 7463.6 [I.V.:4448.4; ZHYQM:5784; IV Piggyback:1526.2] Out: 2045 [Urine:1905; Chest Tube:140] Intake/Output this shift: Total I/O In: 1125.2 [I.V.:757.4; IV Piggyback:367.8] Out: 875 [Urine:775; Chest Tube:100]  General appearance: alert and cooperative Neurologic: intact Heart: regular rate and rhythm, S1, S2 normal, no murmur Lungs: clear to auscultation bilaterally Extremities: edema mild Wound: dressing dry  Lab Results: Recent Labs    09/16/21 2121 09/16/21 2204 09/17/21 0400  WBC 11.6*  --  13.5*  HGB 8.6* 8.2* 8.6*  HCT 26.8* 24.0* 26.1*  PLT 126*  --  146*   BMET:  Recent Labs    09/16/21 2121 09/16/21 2204 09/17/21 0400  NA 137 138 135  K 4.5 4.4 4.4  CL 108  --  107  CO2 22  --  22  GLUCOSE 201*  --  149*  BUN 18  --  20  CREATININE 0.99  --  1.01  CALCIUM 6.9*  --  7.1*    PT/INR:  Recent Labs    09/16/21 1505  LABPROT 11.9  INR 0.9   ABG    Component Value Date/Time   PHART 7.351 09/16/2021 2204   HCO3 23.8 09/16/2021 2204   TCO2 25  09/16/2021 2204   ACIDBASEDEF 2.0 09/16/2021 2204   O2SAT 98 09/16/2021 2204   CBG (last 3)  Recent Labs    09/17/21 0201 09/17/21 0302 09/17/21 0358  GLUCAP 137* 146* 141*   CXR: mild atelectasis ECG: sinus 80. No heart block  Assessment/Plan: S/P Procedure(s) (LRB): AORTIC VALVE REPLACEMENT (AVR) (N/A) REPLACEMENT ASCENDING AORTA WITH 30 X 10MM HEMASHIELD PLATINUM WOVEN DOUBLE VELOUR VASCULAR GRAFT (N/A) TRANSESOPHAGEAL ECHOCARDIOGRAM (TEE) (N/A)  POD 1 Hemodynamically stable still on some neo. CI 3. Rhythm is sinus 80's. Will hold beta blocker this am until off neo, then can resume Coreg at low dose.  DC chest tubes, swan, arterial line.  Wt is 10 lbs over preop. Start diuresis once off neo.  Glucose under good control. Preop Hgb A1c 5.9 on no meds. Transitioned to SSI.  OOB, IS, mobilize.     LOS: 1 day    Luis [name] 09/17/2021

## 2021-09-18 ENCOUNTER — Inpatient Hospital Stay (HOSPITAL_COMMUNITY): Payer: PPO

## 2021-09-18 LAB — GLUCOSE, CAPILLARY
Glucose-Capillary: 137 mg/dL — ABNORMAL HIGH (ref 70–99)
Glucose-Capillary: 138 mg/dL — ABNORMAL HIGH (ref 70–99)
Glucose-Capillary: 157 mg/dL — ABNORMAL HIGH (ref 70–99)
Glucose-Capillary: 160 mg/dL — ABNORMAL HIGH (ref 70–99)
Glucose-Capillary: 185 mg/dL — ABNORMAL HIGH (ref 70–99)

## 2021-09-18 LAB — BASIC METABOLIC PANEL
Anion gap: 3 — ABNORMAL LOW (ref 5–15)
BUN: 29 mg/dL — ABNORMAL HIGH (ref 8–23)
CO2: 26 mmol/L (ref 22–32)
Calcium: 7.3 mg/dL — ABNORMAL LOW (ref 8.9–10.3)
Chloride: 106 mmol/L (ref 98–111)
Creatinine, Ser: 1.19 mg/dL (ref 0.61–1.24)
GFR, Estimated: >60 mL/min (ref >60)
Glucose, Bld: 140 mg/dL — ABNORMAL HIGH (ref 70–99)
Potassium: 4 mmol/L (ref 3.5–5.1)
Sodium: 135 mmol/L (ref 135–145)

## 2021-09-18 LAB — CBC
HCT: 22.6 % — ABNORMAL LOW (ref 39.0–52.0)
Hemoglobin: 7.1 g/dL — ABNORMAL LOW (ref 13.0–17.0)
MCH: 29.5 pg (ref 26.0–34.0)
MCHC: 31.4 g/dL (ref 30.0–36.0)
MCV: 93.8 fL (ref 80.0–100.0)
Platelets: 99 10*3/uL — ABNORMAL LOW (ref 150–400)
RBC: 2.41 MIL/uL — ABNORMAL LOW (ref 4.22–5.81)
RDW: 14.6 % (ref 11.5–15.5)
WBC: 13.2 10*3/uL — ABNORMAL HIGH (ref 4.0–10.5)
nRBC: 0 % (ref 0.0–0.2)

## 2021-09-18 MED ORDER — ~~LOC~~ CARDIAC SURGERY, PATIENT & FAMILY EDUCATION: Freq: Once | Status: AC

## 2021-09-18 MED ORDER — POTASSIUM CHLORIDE CRYS ER 20 MEQ PO TBCR
20.0000 meq | EXTENDED_RELEASE_TABLET | Freq: Three times a day (TID) | ORAL | Status: AC
  Administered 2021-09-18 (×3): 20 meq via ORAL
  Filled 2021-09-18 (×3): qty 1

## 2021-09-18 MED ORDER — SODIUM CHLORIDE 0.9% FLUSH: 3.0000 mL | INTRAVENOUS | Status: DC | PRN

## 2021-09-18 MED ORDER — SODIUM CHLORIDE 0.9 % IV SOLN: 250.0000 mL | INTRAVENOUS | Status: DC | PRN

## 2021-09-18 MED ORDER — SODIUM CHLORIDE 0.9% FLUSH
3.0000 mL | Freq: Two times a day (BID) | INTRAVENOUS | Status: DC
  Administered 2021-09-18: 3 mL via INTRAVENOUS

## 2021-09-18 MED ORDER — FUROSEMIDE 10 MG/ML IJ SOLN
40.0000 mg | Freq: Two times a day (BID) | INTRAMUSCULAR | Status: AC
  Administered 2021-09-18 (×2): 40 mg via INTRAVENOUS
  Filled 2021-09-18 (×2): qty 4

## 2021-09-18 MED ORDER — FE FUMARATE-B12-VIT C-FA-IFC PO CAPS
1.0000 | ORAL_CAPSULE | Freq: Two times a day (BID) | ORAL | Status: DC
  Administered 2021-09-18 – 2021-09-23 (×11): 1 via ORAL
  Filled 2021-09-18 (×11): qty 1

## 2021-09-18 NOTE — Progress Notes (Signed)
[address]       [place] [address]             [phone]                 2 Days Post-Op Procedure(s) (LRB): AORTIC VALVE REPLACEMENT (AVR) (N/A) REPLACEMENT ASCENDING AORTA WITH 30 X 10MM HEMASHIELD PLATINUM WOVEN DOUBLE VELOUR VASCULAR GRAFT (N/A) TRANSESOPHAGEAL ECHOCARDIOGRAM (TEE) (N/A)   Events: No events _______________________________________________________________ Vitals: BP 118/61   Pulse 68   Temp 98.5 F (36.9 C) (Oral)   Resp 18   Ht '5\' 6"'$  (1.676 m)   Wt 130 kg   SpO2 100%   BMI 46.26 kg/m  Filed Weights   09/16/21 0605 09/17/21 0500  Weight: 125.6 kg 130 kg     - Neuro: alert NAD  - Cardiovascular: sinus  Drips: none.   PAP: (27-28)/(19-20) 28/20  - Pulm: EWOB, on CPAP    ABG    Component Value Date/Time   PHART 7.351 09/16/2021 2204   PCO2ART 42.8 09/16/2021 2204   PO2ART 118 (H) 09/16/2021 2204   HCO3 23.8 09/16/2021 2204   TCO2 25 09/16/2021 2204   ACIDBASEDEF 2.0 09/16/2021 2204   O2SAT 98 09/16/2021 2204    - Abd: ND - Extremity: warm  .Intake/Output      05/26 0701 05/27 0700 05/27 0701 05/28 0700   P.O. 120 240   I.V. (mL/kg) 342.9 (2.6) 364.9 (2.8)   Blood     IV Piggyback 100 200   Total Intake(mL/kg) 562.9 (4.3) 804.9 (6.2)   Urine (mL/kg/hr) 1525 (0.5) 275 (0.6)   Chest Tube 140    Total Output 1665 275   Net -1102.1 +529.9           _______________________________________________________________ Labs:    Latest Ref Rng & Units 09/18/2021    5:01 AM 09/17/2021    5:00 PM 09/17/2021    4:00 AM  CBC  WBC 4.0 - 10.5 K/uL 13.2   12.9   13.5    Hemoglobin 13.0 - 17.0 g/dL 7.1   7.5   8.6    Hematocrit 39.0 - 52.0 % 22.6   22.7   26.1    Platelets 150 - 400 K/uL 99   109   146        Latest Ref Rng & Units 09/18/2021    5:01 AM 09/17/2021    5:00 PM 09/17/2021    4:00 AM  CMP  Glucose 70 - 99 mg/dL 140   198   149    BUN 8 - 23 mg/dL '29   25   20    '$ Creatinine 0.61 - 1.24 mg/dL  1.19   1.24   1.01    Sodium 135 - 145 mmol/L 135   135   135    Potassium 3.5 - 5.1 mmol/L 4.0   4.0   4.4    Chloride 98 - 111 mmol/L 106   105   107    CO2 22 - 32 mmol/L '26   23   22    '$ Calcium 8.9 - 10.3 mg/dL 7.3   7.0   7.1      CXR: clear  _______________________________________________________________  Assessment and Plan: POD 2 s/p AVR, ascending.  Neuro: pain controlled CV: pacing wires in place.  Will keep for now.  Stable hemodynamics Pulm: IS, ambulation.  CPAP for comfort Renal: creat stable GI: on diet Heme: stable ID: [email or web address] Endo: SSI Dispo:  floor today   Lajuana Matte 09/18/2021 10:44 AM

## 2021-09-18 NOTE — Progress Notes (Addendum)
Patient Arrived from [facility]. Checked chart for recent EKG to compare with what the CCMD notified the writer about. Performed EKG that resulted in STEMI and Notified the Provider Erine PA. Patient is asymptomatic and reports feeling great. Provider is made aware, Family is present and aware, Charge nurse is notified, and copy of EKG is placed in the chart. Will continue to check with patient and spouse that is at the bedside.

## 2021-09-19 LAB — PREPARE RBC (CROSSMATCH)

## 2021-09-19 LAB — CBC
HCT: 21.9 % — ABNORMAL LOW (ref 39.0–52.0)
Hemoglobin: 7.1 g/dL — ABNORMAL LOW (ref 13.0–17.0)
MCH: 30.5 pg (ref 26.0–34.0)
MCHC: 32.4 g/dL (ref 30.0–36.0)
MCV: 94 fL (ref 80.0–100.0)
Platelets: 125 10*3/uL — ABNORMAL LOW (ref 150–400)
RBC: 2.33 MIL/uL — ABNORMAL LOW (ref 4.22–5.81)
RDW: 14.7 % (ref 11.5–15.5)
WBC: 13.5 10*3/uL — ABNORMAL HIGH (ref 4.0–10.5)
nRBC: 0 % (ref 0.0–0.2)

## 2021-09-19 LAB — GLUCOSE, CAPILLARY
Glucose-Capillary: 136 mg/dL — ABNORMAL HIGH (ref 70–99)
Glucose-Capillary: 152 mg/dL — ABNORMAL HIGH (ref 70–99)

## 2021-09-19 LAB — BASIC METABOLIC PANEL
Anion gap: 5 (ref 5–15)
BUN: 33 mg/dL — ABNORMAL HIGH (ref 8–23)
CO2: 24 mmol/L (ref 22–32)
Calcium: 7.5 mg/dL — ABNORMAL LOW (ref 8.9–10.3)
Chloride: 106 mmol/L (ref 98–111)
Creatinine, Ser: 1.23 mg/dL (ref 0.61–1.24)
GFR, Estimated: >60 mL/min (ref >60)
Glucose, Bld: 129 mg/dL — ABNORMAL HIGH (ref 70–99)
Potassium: 4.4 mmol/L (ref 3.5–5.1)
Sodium: 135 mmol/L (ref 135–145)

## 2021-09-19 LAB — HEMOGLOBIN AND HEMATOCRIT, BLOOD
HCT: 22.1 % — ABNORMAL LOW (ref 39.0–52.0)
Hemoglobin: 7.1 g/dL — ABNORMAL LOW (ref 13.0–17.0)

## 2021-09-19 MED ORDER — AMIODARONE HCL IN DEXTROSE 360-4.14 MG/200ML-% IV SOLN
60.0000 mg/h | INTRAVENOUS | Status: DC
  Administered 2021-09-19 (×2): 60 mg/h via INTRAVENOUS
  Filled 2021-09-19 (×2): qty 200

## 2021-09-19 MED ORDER — FUROSEMIDE 10 MG/ML IJ SOLN
40.0000 mg | Freq: Two times a day (BID) | INTRAMUSCULAR | Status: AC
  Administered 2021-09-19 (×2): 40 mg via INTRAVENOUS
  Filled 2021-09-19 (×2): qty 4

## 2021-09-19 MED ORDER — AMIODARONE HCL IN DEXTROSE 360-4.14 MG/200ML-% IV SOLN
30.0000 mg/h | INTRAVENOUS | Status: DC
  Administered 2021-09-19 – 2021-09-21 (×5): 30 mg/h via INTRAVENOUS
  Filled 2021-09-19 (×5): qty 200

## 2021-09-19 MED ORDER — AMIODARONE LOAD VIA INFUSION
150.0000 mg | Freq: Once | INTRAVENOUS | Status: AC
  Administered 2021-09-19: 150 mg via INTRAVENOUS
  Filled 2021-09-19: qty 83.34

## 2021-09-19 NOTE — Progress Notes (Signed)
  Amiodarone Drug - Drug Interaction Consult Note  Recommendations:  Continue current medications, monitor Qtc and ADE. Consider checking Mg in the next 24 hours   Amiodarone is metabolized by the cytochrome P450 system and therefore has the potential to cause many drug interactions. Amiodarone has an average plasma half-life of 50 days (range 20 to 100 days).   There is potential for drug interactions to occur several weeks or months after stopping treatment and the onset of drug interactions may be slow after initiating amiodarone.   '[x]'$  Statins: Increased risk of myopathy. Simvastatin- restrict dose to '20mg'$  daily. Other statins: counsel patients to report any muscle pain or weakness immediately.  '[]'$  Anticoagulants: Amiodarone can increase anticoagulant effect. Consider warfarin dose reduction. Patients should be monitored closely and the dose of anticoagulant altered accordingly, remembering that amiodarone levels take several weeks to stabilize.  '[]'$  Antiepileptics: Amiodarone can increase plasma concentration of phenytoin, the dose should be reduced. Note that small changes in phenytoin dose can result in large changes in levels. Monitor patient and counsel on signs of toxicity.  '[]'$  Beta blockers: increased risk of bradycardia, AV block and myocardial depression. Sotalol - avoid concomitant use.  '[]'$   Calcium channel blockers (diltiazem and verapamil): increased risk of bradycardia, AV block and myocardial depression.  '[]'$   Cyclosporine: Amiodarone increases levels of cyclosporine. Reduced dose of cyclosporine is recommended.  '[]'$  Digoxin dose should be halved when amiodarone is started.  '[x]'$  Diuretics: increased risk of cardiotoxicity if hypokalemia occurs.  '[]'$  Oral hypoglycemic agents (glyburide, glipizide, glimepiride): increased risk of hypoglycemia. Patient's glucose levels should be monitored closely when initiating amiodarone therapy.   '[x]'$  Drugs that prolong the QT interval:   Torsades de pointes risk may be increased with concurrent use - avoid if possible.  Monitor QTc, also keep magnesium/potassium WNL if concurrent therapy can't be avoided.  Antibiotics: e.g. fluoroquinolones, erythromycin.  Antiarrhythmics: e.g. quinidine, procainamide, disopyramide, sotalol.  Antipsychotics: e.g. phenothiazines, haloperidol.   Lithium, tricyclic antidepressants, and methadone. Thank You,  Ursula Beath  09/19/2021 9:07 AM

## 2021-09-19 NOTE — Progress Notes (Addendum)
      [address]       [place] [address]             [phone]         Patient developed rapid Atrial Fibrillation.  Will initiated Amiodarone protocol.  Will also treat with 1 unit of blood for anemia.  Electrolytes are normal.  Hold off on EPW today.   Ellwood Handler, PA-C

## 2021-09-19 NOTE — Progress Notes (Signed)
Mobility Specialist: Progress Note   09/19/21 1504  Mobility  Activity Ambulated with assistance in hallway  Level of Assistance Minimal assist, patient does 75% or more  Assistive Device Front wheel walker  Distance Ambulated (ft) 160 ft  Activity Response Tolerated well  $Mobility charge 1 Mobility   Pre-Mobility: 126 HR, 100/68 (79) BP, 100% SpO2 During Mobility: 128-137 (147 peak) HR Post-Mobility: 121 HR, 101/70 (80) BP, 100% SpO2  Pt received in the bed and agreeable to mobility. Stopped x2 for brief standing breaks secondary to general fatigue and pt c/o feeling hot. Pt to the chair after session with call bell in reach and family member present in the room.   [facility] Jeslynn Hollander Mobility Specialist Mobility Specialist 5 North: [phone] Mobility Specialist 6 North: [phone]

## 2021-09-19 NOTE — Progress Notes (Addendum)
      [address]       [place] [address]             [phone]      3 Days Post-Op Procedure(s) (LRB): AORTIC VALVE REPLACEMENT (AVR) (N/A) REPLACEMENT ASCENDING AORTA WITH 30 X 10MM HEMASHIELD PLATINUM WOVEN DOUBLE VELOUR VASCULAR GRAFT (N/A) TRANSESOPHAGEAL ECHOCARDIOGRAM (TEE) (N/A)  Subjective:  Patient doing okay.  Pain is well controlled currently.  + ambulation He has not yet moved his bowels  Objective: Vital signs in last 24 hours: Temp:  [97.6 F (36.4 C)-98.6 F (37 C)] 98.6 F (37 C) (05/28 0354) Pulse Rate:  [68-102] 74 (05/28 0354) Cardiac Rhythm: Normal sinus rhythm;Bundle branch block (05/28 0700) Resp:  [16-22] 22 (05/28 0354) BP: (96-127)/(49-63) 127/63 (05/28 0354) SpO2:  [80 %-100 %] 95 % (05/28 0354) Weight:  [131.4 kg] 131.4 kg (05/28 0600)  Intake/Output from previous day: 05/27 0701 - 05/28 0700 In: 1064.9 [P.O.:480; I.V.:384.9; IV Piggyback:200] Out: 990 [Urine:990]  General appearance: alert, cooperative, and no distress Heart: regular rate and rhythm Lungs: clear to auscultation bilaterally Abdomen: soft, non-tender; bowel sounds normal; no masses,  no organomegaly Extremities: edema trace Wound: clean and dry  Lab Results: Recent Labs    09/18/21 0501 09/19/21 0227  WBC 13.2* 13.5*  HGB 7.1* 7.1*  HCT 22.6* 21.9*  PLT 99* 125*   BMET:  Recent Labs    09/18/21 0501 09/19/21 0227  NA 135 135  K 4.0 4.4  CL 106 106  CO2 26 24  GLUCOSE 140* 129*  BUN 29* 33*  CREATININE 1.19 1.23  CALCIUM 7.3* 7.5*    PT/INR:  Recent Labs    09/16/21 1505  LABPROT 11.9  INR 0.9   ABG    Component Value Date/Time   PHART 7.351 09/16/2021 2204   HCO3 23.8 09/16/2021 2204   TCO2 25 09/16/2021 2204   ACIDBASEDEF 2.0 09/16/2021 2204   O2SAT 98 09/16/2021 2204   CBG (last 3)  Recent Labs    09/18/21 1121 09/18/21 1620 09/18/21 2051  GLUCAP 137* 138* 157*    Assessment/Plan: S/P Procedure(s)  (LRB): AORTIC VALVE REPLACEMENT (AVR) (N/A) REPLACEMENT ASCENDING AORTA WITH 30 X 10MM HEMASHIELD PLATINUM WOVEN DOUBLE VELOUR VASCULAR GRAFT (N/A) TRANSESOPHAGEAL ECHOCARDIOGRAM (TEE) (N/A)  CV- NSR, BP is labile- unable to start BB at this time Pulm- no acute issues, continue IS Renal- creatinine at 1.23, remains volume overloaded, continue Lasix, potassium.[name] stop Toradol Expected post operative blood loss anemia- Hgb at 7.1, in setting of low blood pressure may benefit from transfusion, will discuss with Dr. Kipp Brood CBGs controlled, will stop SSIP Dispo- patient stable, will d/c EPW today... BP remains labile, will discuss transfusion with Dr.Kensley Valladares, continue diuretics, d/c cbgs   LOS: 3 days    Luis Handler, PA-C 09/19/2021  Agree with above. A-fib overnight.  Amio protocol started. Continue diuresis. Will transfuse 1 unit of blood.  [name]

## 2021-09-19 NOTE — Progress Notes (Signed)
Patient went into Afib early this morning.  PA notified and Amiodarone started.

## 2021-09-20 ENCOUNTER — Inpatient Hospital Stay (HOSPITAL_COMMUNITY): Payer: PPO

## 2021-09-20 DIAGNOSIS — I4891 Unspecified atrial fibrillation: Secondary | ICD-10-CM

## 2021-09-20 DIAGNOSIS — Z952 Presence of prosthetic heart valve: Secondary | ICD-10-CM | POA: Diagnosis not present

## 2021-09-20 LAB — BPAM RBC
Blood Product Expiration Date: 202306142359
ISSUE DATE / TIME: 202305281147
Unit Type and Rh: 600

## 2021-09-20 LAB — TYPE AND SCREEN
ABO/RH(D): A NEG
Antibody Screen: NEGATIVE
Unit division: 0

## 2021-09-20 LAB — BASIC METABOLIC PANEL
Anion gap: 6 (ref 5–15)
BUN: 33 mg/dL — ABNORMAL HIGH (ref 8–23)
CO2: 27 mmol/L (ref 22–32)
Calcium: 7.8 mg/dL — ABNORMAL LOW (ref 8.9–10.3)
Chloride: 103 mmol/L (ref 98–111)
Creatinine, Ser: 1.11 mg/dL (ref 0.61–1.24)
GFR, Estimated: >60 mL/min (ref >60)
Glucose, Bld: 154 mg/dL — ABNORMAL HIGH (ref 70–99)
Potassium: 3.7 mmol/L (ref 3.5–5.1)
Sodium: 136 mmol/L (ref 135–145)

## 2021-09-20 LAB — PREPARE RBC (CROSSMATCH)

## 2021-09-20 LAB — TSH: TSH: 0.664 u[IU]/mL (ref 0.350–4.500)

## 2021-09-20 LAB — HEMOGLOBIN AND HEMATOCRIT, BLOOD
HCT: 23.5 % — ABNORMAL LOW (ref 39.0–52.0)
Hemoglobin: 7.6 g/dL — ABNORMAL LOW (ref 13.0–17.0)

## 2021-09-20 MED ORDER — AMIODARONE IV BOLUS ONLY 150 MG/100ML
150.0000 mg | Freq: Once | INTRAVENOUS | Status: AC
  Administered 2021-09-20: 150 mg via INTRAVENOUS
  Filled 2021-09-20: qty 100

## 2021-09-20 MED ORDER — POTASSIUM CHLORIDE CRYS ER 20 MEQ PO TBCR
40.0000 meq | EXTENDED_RELEASE_TABLET | Freq: Once | ORAL | Status: AC
  Administered 2021-09-20: 40 meq via ORAL
  Filled 2021-09-20: qty 2

## 2021-09-20 MED ORDER — WARFARIN SODIUM 5 MG PO TABS
5.0000 mg | ORAL_TABLET | Freq: Every day | ORAL | Status: DC
Start: 2021-09-20 — End: 2021-09-21
  Filled 2021-09-20 (×2): qty 1

## 2021-09-20 MED ORDER — DIGOXIN 0.25 MG/ML IJ SOLN
0.2500 mg | Freq: Once | INTRAMUSCULAR | Status: DC
  Filled 2021-09-20: qty 1

## 2021-09-20 MED ORDER — CHLORHEXIDINE GLUCONATE CLOTH 2 % EX PADS
6.0000 | MEDICATED_PAD | Freq: Every day | CUTANEOUS | Status: DC
Start: 2021-09-20 — End: 2021-09-23
  Administered 2021-09-20 – 2021-09-23 (×4): 6 via TOPICAL

## 2021-09-20 MED ORDER — SODIUM CHLORIDE 0.9% FLUSH: 10.0000 mL | INTRAVENOUS | Status: DC | PRN

## 2021-09-20 MED ORDER — TORSEMIDE 20 MG PO TABS
40.0000 mg | ORAL_TABLET | Freq: Once | ORAL | Status: AC
  Administered 2021-09-20: 40 mg via ORAL
  Filled 2021-09-20: qty 2

## 2021-09-20 MED ORDER — FUROSEMIDE 10 MG/ML IJ SOLN
40.0000 mg | Freq: Once | INTRAMUSCULAR | Status: AC
  Administered 2021-09-20: 40 mg via INTRAVENOUS
  Filled 2021-09-20: qty 4

## 2021-09-20 MED ORDER — WARFARIN - PHYSICIAN DOSING INPATIENT: Freq: Every day | Status: DC

## 2021-09-20 MED ORDER — BISACODYL 10 MG RE SUPP
10.0000 mg | Freq: Every day | RECTAL | Status: DC | PRN
Start: 2021-09-20 — End: 2021-09-23
  Filled 2021-09-20: qty 1

## 2021-09-20 MED ORDER — SODIUM CHLORIDE 0.9% FLUSH
10.0000 mL | Freq: Two times a day (BID) | INTRAVENOUS | Status: DC
  Administered 2021-09-20 – 2021-09-23 (×6): 10 mL

## 2021-09-20 NOTE — Care Management Important Message (Signed)
Important Message  Patient Details  Name: Luis [name] MRN: [id number] Date of Birth: December 14, 1955   Medicare Important Message Given:  Yes     [name] Mont[address] Circle 09/20/2021, 1:56 PM

## 2021-09-20 NOTE — Consult Note (Signed)
Cardiology Consultation:   Patient ID: KASPIAN MUCCIO MRN: [id number]; DOB: Feb 07, 1956  Admit date: 09/16/2021 Date of Consult: 09/20/2021  PCP:  Laurey Morale, MD   [facility] Providers Cardiologist:  None  Sleep Medicine:  Fransico Him, MD  { Patient Profile:   ABDULKARIM EBERLIN is a 66 y.o. male with a hx of bicuspid AS and ascending aortic aneurysm s/p Bentall, who is being seen 09/20/2021 for the evaluation of uncontrolled atrial fib  at the request of Dr. Kipp Brood.  History of Present Illness:   Mr. Banfill is a pleasant 66 yo man with a h/o AS, and ascending aortic aneurysm who underwent Bentall several days ago. He has developed atrial fib with a RVR. He has been started on systemic anti-coagulation and IV amiodarone. His bp's have been soft. He denies chest pain or sob. The rest of his history is well documented by Dr. Vivi Martens admit note. He has been constipated.    Past Medical History:  Diagnosis Date   Anxiety    takes Valium as needed   Aortic stenosis, moderate    Arthritis    Ascending aortic aneurysm (HCC)    CAD (coronary artery disease)    a. s/p PCI of the RCA 8/12 with DES by Dr Burt Knack, preserved EF. b. LHC/RHC (2/16) with mean RA 12, PA 32/15, mean PCWP 18, CI 3.47; patent mid and distal RCA stents, 50-60% proximal stenosis small PDA.      Carotid stenosis    a. Carotid US (05/2013):  Bilateral 1-39% ICA; L thyroid nodule (prior hx of aspiration).   Chronic diastolic CHF (congestive heart failure) (HCC)    Complication of anesthesia    difficulty waking up after gallbladder surgery   Depression    Essential hypertension    GERD (gastroesophageal reflux disease)    if needed will take OTC meds    Heart murmur    History of colonic polyps    hyperplastic   Hyperlipidemia    Joint pain    Lesion of bladder    Myocardial infarction ([place]) 2012   Obesity (BMI 30-39.9) 02/29/2016   Restless leg    Sleep apnea    uses cpap   Tubular adenoma of  colon    Vertigo    takes Meclizine as needed    Past Surgical History:  Procedure Laterality Date   AORTIC VALVE REPLACEMENT N/A 09/16/2021   Procedure: AORTIC VALVE REPLACEMENT (AVR);  Surgeon: Gaye Pollack, MD;  Location: [place];  Service: Open Heart Surgery;  Laterality: N/A;   CATARACT EXTRACTION   4 YRS AGO   BOTH EYES   CHOLECYSTECTOMY  07/21/2011   Procedure: LAPAROSCOPIC CHOLECYSTECTOMY WITH INTRAOPERATIVE CHOLANGIOGRAM;  Surgeon: Shann Medal, MD;  Location: [facility];  Service: General;  Laterality: N/A;   CORONARY ANGIOPLASTY  2012   2 stents   coronary stenting     s/p PCI of the RCA by Dr Burt Knack 8/12 with 2 promus stents   CYSTOSCOPY W/ RETROGRADES Bilateral 12/13/2019   Procedure: CYSTOSCOPY WITH RETROGRADE PYELOGRAM;  Surgeon: Alexis Frock, MD;  Location: [facility];  Service: Urology;  Laterality: Bilateral;   CYSTOSCOPY W/ RETROGRADES Bilateral 10/14/2020   Procedure: CYSTOSCOPY WITH RETROGRADE PYELOGRAM;  Surgeon: Alexis Frock, MD;  Location: [facility];  Service: Urology;  Laterality: Bilateral;   CYSTOSCOPY W/ RETROGRADES Bilateral 12/16/2020   Procedure: CYSTOSCOPY WITH RETROGRADE PYELOGRAM;  Surgeon: Alexis Frock, MD;  Location: [facility];  Service: Urology;  Laterality: Bilateral;   LEFT AND RIGHT HEART CATHETERIZATION WITH CORONARY ANGIOGRAM N/A 06/23/2014   Procedure: LEFT AND RIGHT HEART CATHETERIZATION WITH CORONARY ANGIOGRAM;  Surgeon: Larey Dresser, MD;  Location: [facility] CATH LAB;  Service: Cardiovascular;  Laterality: N/A;   NECK SURGERY  03/23/09   per Dr. Lorin Mercy, cervical fusion    REPLACEMENT ASCENDING AORTA N/A 09/16/2021   Procedure: REPLACEMENT ASCENDING AORTA WITH 30 X 10MM HEMASHIELD PLATINUM WOVEN DOUBLE VELOUR VASCULAR GRAFT;  Surgeon: Gaye Pollack, MD;  Location: [place];  Service: Open Heart Surgery;  Laterality: N/A;  CIRC ARREST   right elbow surgery     RIGHT HEART CATH AND CORONARY  ANGIOGRAPHY N/A 07/08/2021   Procedure: RIGHT HEART CATH AND CORONARY ANGIOGRAPHY;  Surgeon: Larey Dresser, MD;  Location: [place] CV LAB;  Service: Cardiovascular;  Laterality: N/A;   solonscopy  05/23/08   per Dr. Wynona Luna hemorrhoids only, repeat in 5 years   TEE WITHOUT CARDIOVERSION N/A 06/23/2014   Procedure: TRANSESOPHAGEAL ECHOCARDIOGRAM (TEE);  Surgeon: Larey Dresser, MD;  Location: [place];  Service: Cardiovascular;  Laterality: N/A;   TEE WITHOUT CARDIOVERSION N/A 01/21/2016   Procedure: TRANSESOPHAGEAL ECHOCARDIOGRAM (TEE);  Surgeon: Larey Dresser, MD;  Location: [place];  Service: Cardiovascular;  Laterality: N/A;   TEE WITHOUT CARDIOVERSION N/A 09/16/2021   Procedure: TRANSESOPHAGEAL ECHOCARDIOGRAM (TEE);  Surgeon: Gaye Pollack, MD;  Location: [place];  Service: Open Heart Surgery;  Laterality: N/A;   TONSILLECTOMY     TRANSURETHRAL RESECTION OF BLADDER TUMOR N/A 10/21/2019   Procedure: TRANSURETHRAL RESECTION OF BLADDER TUMOR (TURBT);  Surgeon: Kathie Rhodes, MD;  Location: [facility];  Service: Urology;  Laterality: N/A;   TRANSURETHRAL RESECTION OF BLADDER TUMOR N/A 12/13/2019   Procedure: TRANSURETHRAL RESECTION OF BLADDER TUMOR (TURBT);  Surgeon: Alexis Frock, MD;  Location: [facility];  Service: Urology;  Laterality: N/A;  1 HR   TRANSURETHRAL RESECTION OF BLADDER TUMOR N/A 10/14/2020   Procedure: TRANSURETHRAL RESECTION OF BLADDER TUMOR (TURBT);  Surgeon: Alexis Frock, MD;  Location: [facility];  Service: Urology;  Laterality: N/A;   TRANSURETHRAL RESECTION OF BLADDER TUMOR N/A 12/16/2020   Procedure: RESTAGING TRANSURETHRAL RESECTION OF BLADDER TUMOR (TURBT);  Surgeon: Alexis Frock, MD;  Location: [facility];  Service: Urology;  Laterality: N/A;     Home Medications:  Prior to Admission medications   Medication Sig Start Date End Date Taking? Authorizing Provider   acetaminophen (TYLENOL) 500 MG tablet Take 1,000 mg by mouth every 6 (six) hours as needed for moderate pain.   Yes [provider]  aspirin 81 MG tablet Take 81 mg by mouth every morning.   Yes [provider]  carvedilol (COREG) 6.25 MG tablet TAKE 1 TABLET BY MOUTH TWICE A DAY Patient taking differently: Take 6.25 mg by mouth 2 (two) times daily with a meal. 08/16/21  Yes Larey Dresser, MD  furosemide (LASIX) 40 MG tablet TAKE 1 TABLET (40 MG TOTAL) BY MOUTH DAILY AND 1/2 TABLETS (20 MG TOTAL) EVERY EVENING. Patient taking differently: Take 20-40 mg by mouth See admin instructions. TAKE 1 TABLET (40 MG TOTAL) BY MOUTH DAILY AND 1/2 TABLETS (20 MG TOTAL) EVERY EVENING. 07/14/21  Yes Larey Dresser, MD  ipratropium (ATROVENT) 0.03 % nasal spray Place 2 sprays into both nostrils every 12 (twelve) hours as needed for rhinitis.   Yes [provider]  losartan (COZAAR) 50 MG tablet TAKE 1 TABLET (50 MG TOTAL)  BY MOUTH IN THE MORNING AND AT BEDTIME. 02/17/21  Yes Larey Dresser, MD  MAGNESIUM PO Take 500 mg by mouth daily.   Yes [provider]  nitroGLYCERIN (NITROSTAT) 0.4 MG SL tablet Place 1 tablet (0.4 mg total) under the tongue every 5 (five) minutes as needed. For chest pain. 11/03/17 12/05/25 Yes Larey Dresser, MD  rosuvastatin (CRESTOR) 20 MG tablet TAKE 1 TABLET BY MOUTH EVERYDAY AT BEDTIME Patient taking differently: Take 20 mg by mouth daily. 06/15/21  Yes Larey Dresser, MD  tamsulosin (FLOMAX) 0.4 MG CAPS capsule Take 0.4 mg by mouth daily.   Yes [provider]  venlafaxine XR (EFFEXOR-XR) 150 MG 24 hr capsule TAKE 1 CAPSULE BY MOUTH DAILY WITH BREAKFAST. Patient taking differently: Take 150 mg by mouth daily with breakfast. 08/16/21  Yes Laurey Morale, MD  meclizine (ANTIVERT) 25 MG tablet Take 1 tablet (25 mg total) by mouth 3 (three) times daily as needed for dizziness. 10/21/16   Laurey Morale, MD    Inpatient  Medications: Scheduled Meds:  acetaminophen  1,000 mg Oral Q6H   aspirin EC  325 mg Oral Daily   bisacodyl  10 mg Oral Daily   Or   bisacodyl  10 mg Rectal Daily   Chlorhexidine Gluconate Cloth  6 each Topical Daily   docusate sodium  200 mg Oral Daily   ferrous [id number] C-folic acid  1 capsule Oral BID PC   mupirocin ointment  1 application. Nasal BID   pantoprazole  40 mg Oral Daily   rosuvastatin  20 mg Oral Daily   sodium chloride flush  10-40 mL Intracatheter Q12H   sodium chloride flush  3 mL Intravenous Q12H   tamsulosin  0.4 mg Oral Daily   venlafaxine XR  150 mg Oral Q breakfast   warfarin  5 mg Oral Daily   Warfarin - Physician Dosing Inpatient   Does not apply q1600   Continuous Infusions:  amiodarone 30 mg/hr (09/19/21 2011)   PRN Meds: bisacodyl, ipratropium, meclizine, metoprolol tartrate, ondansetron (ZOFRAN) IV, sodium chloride flush, sodium chloride flush, traMADol  Allergies:    Allergies  Allergen Reactions   Oxycodone     insomnia    Social History:   Social History   Socioeconomic History   Marital status: Married    Spouse name: Not on file   Number of children: 4   Years of education: Not on file   Highest education level: Not on file  Occupational History   Occupation: Designer, multimedia: Programmer, systems  Tobacco Use   Smoking status: Former    Packs/day: 2.00    Years: 40.00    Pack years: 80.00    Types: Cigarettes    Quit date: 2012    Years since quitting: 11.4   Smokeless tobacco: Never  Vaping Use   Vaping Use: Never used  Substance and Sexual Activity   Alcohol use: No    Alcohol/week: 0.0 standard drinks   Drug use: No   Sexual activity: Not on file  Other Topics Concern   Not on file  Social History Narrative   Not on file   Social Determinants of Health   Financial Resource Strain: Low Risk    Difficulty of Paying Living Expenses: Not hard at all  Food Insecurity: No Food Insecurity    Worried About Charity fundraiser in the Last Year: Never true   [place] in the  Last Year: Never true  Transportation Needs: No Transportation Needs   Lack of Transportation (Medical): No   Lack of Transportation (Non-Medical): No  Physical Activity: Insufficiently Active   Days of Exercise per Week: 3 days   Minutes of Exercise per Session: 30 min  Stress: No Stress Concern Present   Feeling of Stress : Not at all  Social Connections: Moderately Isolated   Frequency of Communication with Friends and Family: More than three times a week   Frequency of Social Gatherings with Friends and Family: More than three times a week   Attends Religious Services: Never   Marine scientist or Organizations: No   Attends Music therapist: Never   Marital Status: Married  Human resources officer Violence: Not At Risk   Fear of Current or Ex-Partner: No   Emotionally Abused: No   Physically Abused: No   Sexually Abused: No    Family History:    Family History  Problem Relation Age of Onset   Lung cancer Mother        lung   Esophageal cancer Cousin    Colon cancer Neg Hx    Rectal cancer Neg Hx    Stomach cancer Neg Hx      ROS:  Please see the history of present illness.   All other ROS reviewed and negative.     Physical Exam/Data:   Vitals:   09/20/21 0400 09/20/21 0418 09/20/21 0500 09/20/21 0734  BP:  109/71  99/71  Pulse: 66 64 64 (!) 137  Resp:  '18 16 20  '$ Temp:  98.4 F (36.9 C)  98.1 F (36.7 C)  TempSrc:  Axillary  Oral  SpO2:  100% 100% 99%  Weight:   133.6 kg   Height:        Intake/Output Summary (Last 24 hours) at 09/20/2021 1140 Last data filed at 09/20/2021 0263 Gross per 24 hour  Intake 480 ml  Output 830 ml  Net -350 ml      09/20/2021    5:00 AM 09/19/2021    6:00 AM 09/17/2021    5:00 AM  Last 3 Weights  Weight (lbs) 294 lb 8.6 oz 289 lb 11 oz 286 lb 9.6 oz  Weight (kg) 133.6 kg 131.4 kg 130 kg     Body mass index is 47.54  kg/m.  General:  Well nourished, well developed, in no acute distress HEENT: normal Neck: no JVD Vascular: No carotid bruits; Distal pulses 2+ bilaterally Cardiac:  normal S1, S2; IRIR tachy; no murmur  Lungs:  clear to auscultation bilaterally, no wheezing, rhonchi or rale, healing sternotomy incision. Abd: soft, nontender, no hepatomegaly  Ext: no edema Musculoskeletal:  No deformities, BUE and BLE strength normal and equal Skin: warm and dry  Neuro:  CNs 2-12 intact, no focal abnormalities noted Psych:  Normal affect   EKG:  The EKG was personally reviewed and demonstrates:  none Telemetry:  Telemetry was personally reviewed and demonstrates:  atrial fib with a RVR/CVR between 90-130/min  Relevant CV Studies: none  Laboratory Data:  High Sensitivity Troponin:  No results for input(s): TROPONINIHS in the last 720 hours.   Chemistry Recent Labs  Lab 09/16/21 2121 09/16/21 2204 09/17/21 0400 09/17/21 1700 09/18/21 0501 09/19/21 0227  NA 137   < > 135 135 135 135  K 4.5   < > 4.4 4.0 4.0 4.4  CL 108  --  107 105 106 106  CO2 22  --  22 23  26 24  GLUCOSE 201*  --  149* 198* 140* 129*  BUN 18  --  20 25* 29* 33*  CREATININE 0.99  --  1.01 1.24 1.19 1.23  CALCIUM 6.9*  --  7.1* 7.0* 7.3* 7.5*  MG 2.9*  --  2.7* 2.7*  --   --   GFRNONAA >60  --  >60 >60 >60 >60  ANIONGAP 7  --  6 7 3* 5   < > = values in this interval not displayed.    Recent Labs  Lab 09/14/21 1133  PROT 6.7  ALBUMIN 3.7  AST <5*  ALT 23  ALKPHOS 82  BILITOT <0.1*   Lipids No results for input(s): CHOL, TRIG, HDL, LABVLDL, LDLCALC, CHOLHDL in the last 168 hours.  Hematology Recent Labs  Lab 09/17/21 1700 09/18/21 0501 09/19/21 0227 09/19/21 1734  WBC 12.9* 13.2* 13.5*  --   RBC 2.45* 2.41* 2.33*  --   HGB 7.5* 7.1* 7.1* 7.1*  HCT 22.7* 22.6* 21.9* 22.1*  MCV 92.7 93.8 94.0  --   MCH 30.6 29.5 30.5  --   MCHC 33.0 31.4 32.4  --   RDW 14.4 14.6 14.7  --   PLT 109* 99* 125*  --     Thyroid No results for input(s): TSH, FREET4 in the last 168 hours.  BNPNo results for input(s): BNP, PROBNP in the last 168 hours.  DDimer No results for input(s): DDIMER in the last 168 hours.   Radiology/Studies:  DG Chest Port 1 View  Result Date: 09/20/2021 CLINICAL DATA:  Status post PICC line placement. EXAM: PORTABLE CHEST 1 VIEW COMPARISON:  Chest radiograph dated Sep 18, 2021 FINDINGS: The heart is enlarged. Left basilar atelectasis and/or small effusion. Lungs otherwise clear. Right access PICC with distal tip in the SVC. IMPRESSION: 1. Cardiomegaly with evidence of prior coronary artery bypass grafting. 2. Low lung volumes with left basilar atelectasis/small effusion. 3. Right access PICC with distal tip in the SVC. Electronically Signed   By: Keane Police D.O.   On: 09/20/2021 09:49   DG Chest Port 1 View  Result Date: 09/18/2021 CLINICAL DATA:  Status post aortic valve replacement. EXAM: PORTABLE CHEST 1 VIEW COMPARISON:  09/17/2021 FINDINGS: Stable enlarged cardiac silhouette, median sternotomy wires and prosthetic aortic valve. No significant change in linear atelectasis in the left lower lung zone. Interval minimal linear atelectasis in the right lower lung zone. Mild diffuse peribronchial thickening. The right jugular Swan-Ganz catheter has been removed and the sheath remains in place with a kink in the sheath at the mid to lower right neck. The mediastinal and chest tubes have been removed. No pneumothorax. Cervical spine fixation hardware. IMPRESSION: 1. Stable left basilar atelectasis and interval minimal right basilar atelectasis. 2. Mild bronchitic changes. 3. Stable cardiomegaly. Electronically Signed   By: Claudie Revering M.D.   On: 09/18/2021 09:53   DG Chest Port 1 View  Result Date: 09/17/2021 CLINICAL DATA:  Aortic valve replacement yesterday EXAM: PORTABLE CHEST 1 VIEW COMPARISON:  Prior chest x-ray 09/16/2021 FINDINGS: Right IJ vascular sheath conveys a Swan-Ganz  catheter into the heart. The tip of the Swan overlies the main pulmonary outflow tract. It appears the catheter has curved back on itself. Patient is status post median sternotomy with evidence of mitral valve replacement. Mediastinal and left chest tubes in place. The left chest tube may be in the left pleural space, or represent a pericardial drain. Stable cardiomegaly. Pulmonary vascular congestion without overt edema. Slightly improved  inspiratory volumes. No pneumothorax. No acute osseous abnormality. Endotracheal tube and gastric tube have been removed. IMPRESSION: 1. The tip of the Swan-Ganz catheter appears curved back on itself and overlying the main pulmonary outflow tract. 2. Improved inspiratory volumes compared to yesterday. 3. Stable cardiomegaly and pulmonary vascular congestion without overt edema. 4. Interval extubation and removal of gastric tube. 5. Stable position of mediastinal and left chest drains. Electronically Signed   By: Jacqulynn Cadet M.D.   On: 09/17/2021 08:46   DG Chest Port 1 View  Result Date: 09/16/2021 CLINICAL DATA:  Status post AVR. EXAM: PORTABLE CHEST 1 VIEW COMPARISON:  Chest x-ray 09/14/2021 FINDINGS: Endotracheal tube tip is proximally 5.5 cm above carina. Enteric tube extends below the diaphragm. Mediastinal drain and left pleural present. There are new median sternotomy wires and prosthetic heart valve. Swan-Ganz catheter tip projects over the level of the main pulmonary artery. There is minimal left basilar atelectasis/airspace disease. No pleural effusion or pneumothorax. Cardiac silhouette is mildly enlarged. No acute fractures are seen. IMPRESSION: 1. New recent postoperative changes as above. 2. Mild cardiomegaly. 3. Minimal left basilar atelectasis/airspace disease. Electronically Signed   By: [name] M.D.   On: 09/16/2021 15:39   [place] EKG SITE RITE  Result Date: 09/20/2021 If Site Rite image not attached, placement could not be confirmed due to  current cardiac rhythm.    Assessment and Plan:   Postop atrial fib with a RVR - the patient appears comfortable. I would continue the IV amiodarone. I'll give a single dose of IV digoxin. DCCV tomorrow if he is still out of rhythm. Agree with holding beta blocker or calcium blocker in the setting of borderline bp. S/p AVR - post op course has otherwise been unremarkable. Coags - he has been started on coumadin but this will take days to become therapeutic. He will need IV heparin if ok from a post op surgical risk. AS - s/p AVR and no murmur on exam today.     For questions or updates, please contact [place] Please consult [email or web address] for contact info under    Signed, Cristopher Peru, MD  09/20/2021 11:40 AM

## 2021-09-20 NOTE — Progress Notes (Signed)
Mobility Specialist Progress Note    09/20/21 1600  Mobility  Activity Ambulated with assistance in hallway  Level of Assistance Standby assist, set-up cues, supervision of patient - no hands on  Assistive Device Front wheel walker  Distance Ambulated (ft) 240 ft  Activity Response Tolerated fair  $Mobility charge 1 Mobility   Pre-Mobility: 89 HR During Mobility: 94 HR Post-Mobility: 88 HR  Pt received in chair and agreeable. Took x2 standing rest breaks. Pt SOB with exertion. Returned to chair with call bell in reach.    Hildred Alamin Mobility Specialist  Primary: 5N M.S. Phone: [phone] Secondary: 6N M.S. Phone: [phone]

## 2021-09-20 NOTE — Progress Notes (Signed)
Mobility Specialist Progress Note:   09/20/21 0946  Mobility  Activity Refused mobility   Will follow-up as time allows.   [facility] Ariadna Setter Mobility Specialist

## 2021-09-20 NOTE — Progress Notes (Addendum)
[address]       [place] [address]             [phone]      4 Days Post-Op Procedure(s) (LRB): AORTIC VALVE REPLACEMENT (AVR) (N/A) REPLACEMENT ASCENDING AORTA WITH 30 X 10MM HEMASHIELD PLATINUM WOVEN DOUBLE VELOUR VASCULAR GRAFT (N/A) TRANSESOPHAGEAL ECHOCARDIOGRAM (TEE) (N/A)  Subjective:  Patient is having low back pain that radiates into his testicle.  His wife said the testicle isn't present and she is wondering if there is something wrong.  He has a history cancer and was due to have urology follow up on 6/8.   + ambulation   He has not yet moved his bowels  Objective: Vital signs in last 24 hours: Temp:  [97.9 F (36.6 C)-98.8 F (37.1 C)] 98.1 F (36.7 C) (05/29 0734) Pulse Rate:  [64-137] 137 (05/29 0734) Cardiac Rhythm: Atrial fibrillation;Bundle branch block (05/29 0747) Resp:  [16-20] 20 (05/29 0734) BP: (95-122)/(54-92) 99/71 (05/29 0734) SpO2:  [90 %-100 %] 99 % (05/29 0734) Weight:  [133.6 kg] 133.6 kg (05/29 0500)  Intake/Output from previous day: 05/28 0701 - 05/29 0700 In: 723 [P.O.:390; I.V.:3; Blood:330] Out: 930 [Urine:930]  General appearance: alert, cooperative, and no distress Heart: irregularly irregular rhythm Lungs: clear to auscultation bilaterally Abdomen: soft, non-tender; bowel sounds normal; no masses,  no organomegaly Extremities: edema + 1 pitting Wound: clean and dry  Lab Results: Recent Labs    09/18/21 0501 09/19/21 0227 09/19/21 1734  WBC 13.2* 13.5*  --   HGB 7.1* 7.1* 7.1*  HCT 22.6* 21.9* 22.1*  PLT 99* 125*  --    BMET:  Recent Labs    09/18/21 0501 09/19/21 0227  NA 135 135  K 4.0 4.4  CL 106 106  CO2 26 24  GLUCOSE 140* 129*  BUN 29* 33*  CREATININE 1.19 1.23  CALCIUM 7.3* 7.5*    PT/INR: No results for input(s): LABPROT, INR in the last 72 hours. ABG    Component Value Date/Time   PHART 7.351 09/16/2021 2204   HCO3 23.8 09/16/2021 2204   TCO2 25 09/16/2021 2204    ACIDBASEDEF 2.0 09/16/2021 2204   O2SAT 98 09/16/2021 2204   CBG (last 3)  Recent Labs    09/18/21 2051 09/19/21 1138 09/19/21 1641  GLUCAP 157* 136* 152*    Assessment/Plan: S/P Procedure(s) (LRB): AORTIC VALVE REPLACEMENT (AVR) (N/A) REPLACEMENT ASCENDING AORTA WITH 30 X 10MM HEMASHIELD PLATINUM WOVEN DOUBLE VELOUR VASCULAR GRAFT (N/A) TRANSESOPHAGEAL ECHOCARDIOGRAM (TEE) (N/A)  CV- Atrial Fibrillation with RVR is on Amiodarone gtt, will rebolus this morning, unfortunately his pressure is too low for Lopressor.[name] if this persists could add Digoxin and he will likely require coumadin  Pulm- no acute issues, off oxygen, continue IS Renal- weight is up about 20 lbs, will give IV lasix today, supplement potassium, labs are pending Expected blood loss anemia, Hgb 7.1 after 1 unit of packed cells, will repeat today GU- patient voiding, but not a lot, will check post void residual to ensure he is emptying bladder fully, unsure what to make of testicular complaints... patient has urology follow up in the future, will monitor Dispo- patient remains in rapid Atrial Fibrillation, will repeat IV Amiodarone bolus... he has poorly functioning IV access, they were unable to get blood this morning, have requested PICC line placement for IV transfusions and to obtain blood, will give IV lasix today if pressure tolerates will repeat this afternoon.   Repeat transfusion, continue  current care   LOS: 4 days   Ellwood Handler, PA-C 09/20/2021  Agree with above Remains in afib Adding coumadin Awaiting labs  Kaaren Nass O Jerrell Mangel

## 2021-09-20 NOTE — Progress Notes (Signed)
Peripherally Inserted Central Catheter Placement  The IV Nurse has discussed with the patient and/or persons authorized to consent for the patient, the purpose of this procedure and the potential benefits and risks involved with this procedure.  The benefits include less needle sticks, lab draws from the catheter, and the patient may be discharged home with the catheter. Risks include, but not limited to, infection, bleeding, blood clot (thrombus formation), and puncture of an artery; nerve damage and irregular heartbeat and possibility to perform a PICC exchange if needed/ordered by physician.  Alternatives to this procedure were also discussed.  Bard Power PICC patient education guide, fact sheet on infection prevention and patient information card has been provided to patient /or left at bedside.    PICC Placement Documentation  PICC Double Lumen 34/19/62 Right Cephalic 41 cm 0 cm (Active)  Indication for Insertion or Continuance of Line Vasoactive infusions;Limited venous access - need for IV therapy >5 days (PICC only) 09/20/21 0926  Exposed Catheter (cm) 0 cm 09/20/21 0926  Site Assessment Clean, Dry, Intact 09/20/21 0926  Lumen #1 Status Flushed;Saline locked;Blood return noted 09/20/21 0926  Lumen #2 Status Blood return noted;Saline locked;Flushed 09/20/21 0926  Dressing Type Transparent;Securing device 09/20/21 0926  Dressing Status Antimicrobial disc in place 09/20/21 2297  Dressing Intervention New dressing;Other (Comment) 09/20/21 0926  Dressing Change Due 09/27/21 09/20/21 0926       Christella Noa Albarece 09/20/2021, 9:29 AM

## 2021-09-20 NOTE — Progress Notes (Signed)
The patient was started on a Unit of RBCs at [facility] by the day shift nurse and did not show any signs of a reaction after 15 minutes. Suddenly around 2050, the patient started shivering and complaining of chills.  The nurse stopped the blood at 2055.  His vitals at 2105 were normal with a BP at 110/66, HR 77, Resp at 21, O2 94% and temp of 98.2.  A few minutes after the blood was stopped, the pt stopped shivering and felt better.  The on call cardiothoracic surgeon was informed.  Will continue to monitor.  Lupita Dawn, RN

## 2021-09-21 ENCOUNTER — Inpatient Hospital Stay: Payer: Self-pay

## 2021-09-21 DIAGNOSIS — Z952 Presence of prosthetic heart valve: Secondary | ICD-10-CM | POA: Diagnosis not present

## 2021-09-21 DIAGNOSIS — I4891 Unspecified atrial fibrillation: Secondary | ICD-10-CM | POA: Diagnosis not present

## 2021-09-21 LAB — BPAM RBC
Blood Product Expiration Date: 202306142359
ISSUE DATE / TIME: 202305291845
Unit Type and Rh: 600

## 2021-09-21 LAB — CBC
HCT: 22.8 % — ABNORMAL LOW (ref 39.0–52.0)
Hemoglobin: 7.4 g/dL — ABNORMAL LOW (ref 13.0–17.0)
MCH: 29.7 pg (ref 26.0–34.0)
MCHC: 32.5 g/dL (ref 30.0–36.0)
MCV: 91.6 fL (ref 80.0–100.0)
Platelets: 156 10*3/uL (ref 150–400)
RBC: 2.49 MIL/uL — ABNORMAL LOW (ref 4.22–5.81)
RDW: 15.4 % (ref 11.5–15.5)
WBC: 9.3 10*3/uL (ref 4.0–10.5)
nRBC: 0.3 % — ABNORMAL HIGH (ref 0.0–0.2)

## 2021-09-21 LAB — TYPE AND SCREEN
ABO/RH(D): A NEG
Antibody Screen: NEGATIVE
Unit division: 0

## 2021-09-21 LAB — BASIC METABOLIC PANEL
Anion gap: 8 (ref 5–15)
BUN: 32 mg/dL — ABNORMAL HIGH (ref 8–23)
CO2: 26 mmol/L (ref 22–32)
Calcium: 7.7 mg/dL — ABNORMAL LOW (ref 8.9–10.3)
Chloride: 100 mmol/L (ref 98–111)
Creatinine, Ser: 1.16 mg/dL (ref 0.61–1.24)
GFR, Estimated: >60 mL/min (ref >60)
Glucose, Bld: 162 mg/dL — ABNORMAL HIGH (ref 70–99)
Potassium: 3.5 mmol/L (ref 3.5–5.1)
Sodium: 134 mmol/L — ABNORMAL LOW (ref 135–145)

## 2021-09-21 LAB — PROTIME-INR
INR: 1.2 (ref 0.8–1.2)
Prothrombin Time: 15.1 seconds (ref 11.4–15.2)

## 2021-09-21 LAB — GLUCOSE, CAPILLARY: Glucose-Capillary: 136 mg/dL — ABNORMAL HIGH (ref 70–99)

## 2021-09-21 LAB — OCCULT BLOOD X 1 CARD TO LAB, STOOL: Fecal Occult Bld: NEGATIVE

## 2021-09-21 MED ORDER — FUROSEMIDE 10 MG/ML IJ SOLN
40.0000 mg | Freq: Once | INTRAMUSCULAR | Status: DC
Start: 2021-09-21 — End: 2021-09-21
  Administered 2021-09-21: 40 mg via INTRAVENOUS
  Filled 2021-09-21: qty 4

## 2021-09-21 MED ORDER — DIGOXIN 125 MCG PO TABS
0.2500 mg | ORAL_TABLET | Freq: Every day | ORAL | Status: DC
  Administered 2021-09-21 – 2021-09-23 (×3): 0.25 mg via ORAL
  Filled 2021-09-21 (×3): qty 2

## 2021-09-21 MED ORDER — METOLAZONE 5 MG PO TABS
2.5000 mg | ORAL_TABLET | Freq: Every day | ORAL | Status: DC
  Administered 2021-09-21 – 2021-09-22 (×2): 2.5 mg via ORAL
  Filled 2021-09-21 (×2): qty 1

## 2021-09-21 MED ORDER — TORSEMIDE 20 MG PO TABS
20.0000 mg | ORAL_TABLET | Freq: Every day | ORAL | Status: DC
  Administered 2021-09-21 – 2021-09-22 (×2): 20 mg via ORAL
  Filled 2021-09-21 (×2): qty 1

## 2021-09-21 MED ORDER — POTASSIUM CHLORIDE CRYS ER 20 MEQ PO TBCR
40.0000 meq | EXTENDED_RELEASE_TABLET | Freq: Once | ORAL | Status: AC
  Administered 2021-09-21: 40 meq via ORAL
  Filled 2021-09-21: qty 2

## 2021-09-21 MED ORDER — TORSEMIDE 20 MG PO TABS: 20.0000 mg | ORAL_TABLET | Freq: Every day | ORAL | Status: DC

## 2021-09-21 MED ORDER — TORSEMIDE 20 MG PO TABS
40.0000 mg | ORAL_TABLET | Freq: Once | ORAL | Status: DC
Start: 2021-09-21 — End: 2021-09-21

## 2021-09-21 MED ORDER — AMIODARONE HCL 200 MG PO TABS
400.0000 mg | ORAL_TABLET | Freq: Two times a day (BID) | ORAL | Status: DC
  Administered 2021-09-21 – 2021-09-23 (×4): 400 mg via ORAL
  Filled 2021-09-21 (×4): qty 2

## 2021-09-21 MED ORDER — POTASSIUM CHLORIDE CRYS ER 20 MEQ PO TBCR: 40.0000 meq | EXTENDED_RELEASE_TABLET | Freq: Once | ORAL | Status: AC

## 2021-09-21 MED ORDER — POTASSIUM CHLORIDE CRYS ER 20 MEQ PO TBCR
40.0000 meq | EXTENDED_RELEASE_TABLET | Freq: Two times a day (BID) | ORAL | Status: DC
Start: 2021-09-21 — End: 2021-09-21

## 2021-09-21 NOTE — Progress Notes (Signed)
Patient using hospital CPAP independently. Patient was already on unit when RT came into room. Told pt to call if he needed anything.

## 2021-09-21 NOTE — Progress Notes (Signed)
CARDIAC REHAB PHASE I   PRE:  Rate/Rhythm: 76 NSR  BP:  Sitting: 114/57      SaO2: 94 RA  MODE:  Ambulation: 240 ft   POST:  Rate/Rhythm: 90 NSR  BP:  Sitting: 169/71      SaO2: 97 RA  Seen pt from [phone], pt was ambulated through the hallways with RW and contact guard assist. Pt walked well in hallways, had steady gait, only took one break for SOB. SaO2 reading was 92 during this break. Pt returned to bed and initial blood pressure reading was 182/73, pt felt hot but denied headache and unusual symptoms. Pt and was encouraged to continue IS use prior to leaving.    Christen Bame  8:39 AM 09/21/2021

## 2021-09-21 NOTE — Progress Notes (Addendum)
[address]       [place] [address]             [phone]       5 Days Post-Op Procedure(s) (LRB): AORTIC VALVE REPLACEMENT (AVR) (N/A) REPLACEMENT ASCENDING AORTA WITH 30 X 10MM HEMASHIELD PLATINUM WOVEN DOUBLE VELOUR VASCULAR GRAFT (N/A) TRANSESOPHAGEAL ECHOCARDIOGRAM (TEE) (N/A)  Subjective:  Patient hungry this morning, sitting up eating breakfast.  He has had some hematuria overnight.  Per nursing documentation appeared to be clots.  He has had a small bowel movement which he denied blood loss.  Objective: Vital signs in last 24 hours: Temp:  [98 F (36.7 C)-98.3 F (36.8 C)] 98.2 F (36.8 C) (05/30 0340) Pulse Rate:  [67-137] 69 (05/30 0340) Cardiac Rhythm: Normal sinus rhythm;Heart block (05/29 1921) Resp:  [14-21] 19 (05/30 0340) BP: (96-125)/(51-71) 125/60 (05/30 0340) SpO2:  [96 %-100 %] 100 % (05/30 0340) Weight:  [131.2 kg] 131.2 kg (05/30 0340)  Intake/Output from previous day: 05/29 0701 - 05/30 0700 In: 1743.1 [P.O.:240; I.V.:948.1; Blood:555] Out: 3500 [XFGHW:2993]  General appearance: alert, cooperative, and no distress Heart: regular rate and rhythm Lungs: clear to auscultation bilaterally Abdomen: soft, non-tender; bowel sounds normal; no masses,  no organomegaly Extremities: edema +1 pitting Wound: clean and dry  Lab Results: Recent Labs    09/19/21 0227 09/19/21 1734 09/20/21 1125 09/21/21 0333  WBC 13.5*  --   --  9.3  HGB 7.1*   < > 7.6* 7.4*  HCT 21.9*   < > 23.5* 22.8*  PLT 125*  --   --  156   < > = values in this interval not displayed.   BMET:  Recent Labs    09/20/21 1125 09/21/21 0333  NA 136 134*  K 3.7 3.5  CL 103 100  CO2 27 26  GLUCOSE 154* 162*  BUN 33* 32*  CREATININE 1.11 1.16  CALCIUM 7.8* 7.7*    PT/INR:  Recent Labs    09/21/21 0333  LABPROT 15.1  INR 1.2   ABG    Component Value Date/Time   PHART 7.351 09/16/2021 2204   HCO3 23.8 09/16/2021 2204   TCO2 25 09/16/2021  2204   ACIDBASEDEF 2.0 09/16/2021 2204   O2SAT 98 09/16/2021 2204   CBG (last 3)  Recent Labs    09/18/21 2051 09/19/21 1138 09/19/21 1641  GLUCAP 157* 136* 152*    Assessment/Plan: S/P Procedure(s) (LRB): AORTIC VALVE REPLACEMENT (AVR) (N/A) REPLACEMENT ASCENDING AORTA WITH 30 X 10MM HEMASHIELD PLATINUM WOVEN DOUBLE VELOUR VASCULAR GRAFT (N/A) TRANSESOPHAGEAL ECHOCARDIOGRAM (TEE) (N/A)  CV- PAF, converted to NSR yesterday, SBP improved- will transition to oral Amiodarone this evening, continue Digoxin, will remove EPW today INR 1.2, as discussed with Dr. Cyndia Bent will d/c coumadin due to increase risk of bleeding with clotting from bladder cancer Pulm- no acute issues, continue IS Renal- creatinine at 1.16, remains stable, edematous on exam, but weight is trending down, will continue IV Lasix, Demadex, continue potassium Post operative blood loss anemia, Hgb is only up to 7.4 after 2 units of blood.. he is having some hematuria with clots, no other obvious source of bleeding, will check occult stool, continue Trinsicon Dispo- patient stable, converted to NSR, will d/c EPW and transition to oral Amiodarone, continue diuretics, monitor Hgb and sources for bleeding.   LOS: 5 days    Ellwood Handler, PA-C 09/21/2021   Chart reviewed, patient examined, agree with above. He is back in sinus since  yesterday on IV amio. Will not anticoagulate due to blood clots in urine probably due to his hx of recurrent bladder cancer. No clots today so far. Wt still 12 lbs over preop. Continue diuresis, KCL.

## 2021-09-21 NOTE — Progress Notes (Addendum)
Electrophysiology Rounding Note  Patient Name: Luis [name] Date of Encounter: 09/21/2021  Electrophysiologist: New   Subjective   Comfortable in chair this am. Had hematuria overnight coumadin held.   Inpatient Medications    Scheduled Meds:  acetaminophen  1,000 mg Oral Q6H   amiodarone  400 mg Oral BID   bisacodyl  10 mg Oral Daily   Or   bisacodyl  10 mg Rectal Daily   Chlorhexidine Gluconate Cloth  6 each Topical Daily   digoxin  0.25 mg Intravenous Once   digoxin  0.25 mg Oral Daily   docusate sodium  200 mg Oral Daily   ferrous [id number] C-folic acid  1 capsule Oral BID PC   metolazone  2.5 mg Oral Daily   mupirocin ointment  1 application. Nasal BID   pantoprazole  40 mg Oral Daily   rosuvastatin  20 mg Oral Daily   sodium chloride flush  10-40 mL Intracatheter Q12H   sodium chloride flush  3 mL Intravenous Q12H   tamsulosin  0.4 mg Oral Daily   venlafaxine XR  150 mg Oral Q breakfast   Continuous Infusions:  amiodarone 30 mg/hr (09/21/21 0736)   PRN Meds: bisacodyl, ipratropium, meclizine, metoprolol tartrate, ondansetron (ZOFRAN) IV, sodium chloride flush, sodium chloride flush, traMADol   Vital Signs    Vitals:   09/20/21 2105 09/20/21 2330 09/21/21 0340 09/21/21 0750  BP: 110/66 (!) 113/51 125/60 (!) 114/57  Pulse: 74 71 69 72  Resp: (!) '21 14 19 20  '$ Temp: 98.2 F (36.8 C) 98.2 F (36.8 C) 98.2 F (36.8 C) 98.2 F (36.8 C)  TempSrc: Oral Oral Oral Oral  SpO2: 97% 100% 100% 97%  Weight:   131.2 kg   Height:        Intake/Output Summary (Last 24 hours) at 09/21/2021 0840 Last data filed at 09/21/2021 0234 Gross per 24 hour  Intake 1743.11 ml  Output 1375 ml  Net 368.11 ml   Filed Weights   09/19/21 0600 09/20/21 0500 09/21/21 0340  Weight: 131.4 kg 133.6 kg 131.2 kg    Physical Exam    GEN- The patient is well appearing, alert and oriented x 3 today.   Head- normocephalic, atraumatic Eyes-  Sclera clear, conjunctiva  pink Ears- hearing intact Oropharynx- clear Neck- supple Lungs- Clear to ausculation bilaterally, normal work of breathing Heart- Regular rate and rhythm, no murmurs, rubs or gallops GI- soft, NT, ND, + BS Extremities- no clubbing or cyanosis. No edema Skin- no rash or lesion Psych- euthymic mood, full affect Neuro- strength and sensation are intact  Labs    CBC Recent Labs    09/19/21 0227 09/19/21 1734 09/20/21 1125 09/21/21 0333  WBC 13.5*  --   --  9.3  HGB 7.1*   < > 7.6* 7.4*  HCT 21.9*   < > 23.5* 22.8*  MCV 94.0  --   --  91.6  PLT 125*  --   --  156   < > = values in this interval not displayed.   Basic Metabolic Panel Recent Labs    09/20/21 1125 09/21/21 0333  NA 136 134*  K 3.7 3.5  CL 103 100  CO2 27 26  GLUCOSE 154* 162*  BUN 33* 32*  CREATININE 1.11 1.16  CALCIUM 7.8* 7.7*   Liver Function Tests No results for input(s): AST, ALT, ALKPHOS, BILITOT, PROT, ALBUMIN in the last 72 hours. No results for input(s): LIPASE, AMYLASE in the last 72 hours. Cardiac  Enzymes No results for input(s): CKTOTAL, CKMB, CKMBINDEX, TROPONINI in the last 72 hours.   Telemetry    NSR 60-70s currently, had several brief runs of AF overnight but overall maintaining NSR.  (personally reviewed)  Radiology    DG Chest Port 1 View  Result Date: 09/20/2021 CLINICAL DATA:  Status post PICC line placement. EXAM: PORTABLE CHEST 1 VIEW COMPARISON:  Chest radiograph dated Sep 18, 2021 FINDINGS: The heart is enlarged. Left basilar atelectasis and/or small effusion. Lungs otherwise clear. Right access PICC with distal tip in the SVC. IMPRESSION: 1. Cardiomegaly with evidence of prior coronary artery bypass grafting. 2. Low lung volumes with left basilar atelectasis/small effusion. 3. Right access PICC with distal tip in the SVC. Electronically Signed   By: Keane Police D.O.   On: 09/20/2021 09:49   [place] EKG SITE RITE  Result Date: 09/20/2021 If Site Rite image not attached,  placement could not be confirmed due to current cardiac rhythm.   Patient Profile     Luis [name] is a 66 y.o. male with a hx of bicuspid AS and ascending aortic aneurysm s/p Bentall, who is being seen 09/20/2021 for the evaluation of uncontrolled atrial fib  at the request of Dr. Kipp Brood.  Assessment & Plan    Postop atrial fib with a RVR  Pt converted to NSR overnight OAC on hold with hematuria and post op anemia.  Continue IV amiodarone for now. Will discuss timing of po with MD.   2. AS S/p AVR  Post op course has otherwise been unremarkable.  3. Coags Coumadin held with hematuria / clotting from bladder CA.  Hgb 7.4 this am.  FOBT pending.    For questions or updates, please contact [place] Please consult [email or web address] for contact info under Cardiology/STEMI.  Signed, Shirley Friar, PA-C  09/21/2021, 8:40 AM   EP Attending  Patient seen and examined. CV with a RRR and lungs with minimal rales. He is doing better today, back in NSR. Continue IV amiodarone until tomorrow and then switch to oral amiodarone. Might consider urologic work up for the cause of hematuria.   Carleene Overlie Fronia Depass,MD

## 2021-09-21 NOTE — Progress Notes (Signed)
Twice when the patient voided tonight, it seemed that he had passed a blood clot with his urine.  No signs of bleeding seen.  Will continue to monitor.  Lupita Dawn, RN

## 2021-09-22 LAB — CBC
HCT: 24 % — ABNORMAL LOW (ref 39.0–52.0)
Hemoglobin: 7.8 g/dL — ABNORMAL LOW (ref 13.0–17.0)
MCH: 29.7 pg (ref 26.0–34.0)
MCHC: 32.5 g/dL (ref 30.0–36.0)
MCV: 91.3 fL (ref 80.0–100.0)
Platelets: 195 10*3/uL (ref 150–400)
RBC: 2.63 MIL/uL — ABNORMAL LOW (ref 4.22–5.81)
RDW: 15.6 % — ABNORMAL HIGH (ref 11.5–15.5)
WBC: 9.2 10*3/uL (ref 4.0–10.5)
nRBC: 0.4 % — ABNORMAL HIGH (ref 0.0–0.2)

## 2021-09-22 LAB — BASIC METABOLIC PANEL
Anion gap: 10 (ref 5–15)
BUN: 26 mg/dL — ABNORMAL HIGH (ref 8–23)
CO2: 30 mmol/L (ref 22–32)
Calcium: 8.1 mg/dL — ABNORMAL LOW (ref 8.9–10.3)
Chloride: 95 mmol/L — ABNORMAL LOW (ref 98–111)
Creatinine, Ser: 1.16 mg/dL (ref 0.61–1.24)
GFR, Estimated: >60 mL/min (ref >60)
Glucose, Bld: 116 mg/dL — ABNORMAL HIGH (ref 70–99)
Potassium: 3.2 mmol/L — ABNORMAL LOW (ref 3.5–5.1)
Sodium: 135 mmol/L (ref 135–145)

## 2021-09-22 LAB — PROTIME-INR
INR: 1.4 — ABNORMAL HIGH (ref 0.8–1.2)
Prothrombin Time: 16.6 seconds — ABNORMAL HIGH (ref 11.4–15.2)

## 2021-09-22 MED ORDER — CARVEDILOL 3.125 MG PO TABS
3.1250 mg | ORAL_TABLET | Freq: Two times a day (BID) | ORAL | Status: DC
  Administered 2021-09-22 – 2021-09-23 (×3): 3.125 mg via ORAL
  Filled 2021-09-22 (×3): qty 1

## 2021-09-22 MED ORDER — POTASSIUM CHLORIDE CRYS ER 20 MEQ PO TBCR
40.0000 meq | EXTENDED_RELEASE_TABLET | Freq: Two times a day (BID) | ORAL | Status: DC
  Administered 2021-09-22 (×2): 40 meq via ORAL
  Filled 2021-09-22 (×2): qty 2

## 2021-09-22 MED ORDER — ASPIRIN 81 MG PO CHEW
81.0000 mg | CHEWABLE_TABLET | Freq: Every day | ORAL | Status: DC
  Administered 2021-09-22 – 2021-09-23 (×2): 81 mg via ORAL
  Filled 2021-09-22 (×2): qty 1

## 2021-09-22 NOTE — Plan of Care (Signed)
  Problem: Education: Goal: Knowledge of General Education information will improve Description: Including pain rating scale, medication(s)/side effects and non-pharmacologic comfort measures Outcome: Progressing   Problem: Health Behavior/Discharge Planning: Goal: Ability to manage health-related needs will improve Outcome: Progressing   Problem: Clinical Measurements: Goal: Ability to maintain clinical measurements within normal limits will improve Outcome: Progressing Goal: Will remain free from infection Outcome: Progressing Goal: Diagnostic test results will improve Outcome: Progressing Goal: Respiratory complications will improve Outcome: Progressing Goal: Cardiovascular complication will be avoided Outcome: Progressing   Problem: Elimination: Goal: Will not experience complications related to bowel motility Outcome: Progressing Goal: Will not experience complications related to urinary retention Outcome: Progressing   Problem: Pain Managment: Goal: General experience of comfort will improve Outcome: Progressing   Problem: Safety: Goal: Ability to remain free from injury will improve Outcome: Progressing   

## 2021-09-22 NOTE — Progress Notes (Addendum)
      [address]       [place] [address]             [phone]      6 Days Post-Op Procedure(s) (LRB): AORTIC VALVE REPLACEMENT (AVR) (N/A) REPLACEMENT ASCENDING AORTA WITH 30 X 10MM HEMASHIELD PLATINUM WOVEN DOUBLE VELOUR VASCULAR GRAFT (N/A) TRANSESOPHAGEAL ECHOCARDIOGRAM (TEE) (N/A)  Subjective:  Patient sitting up in chair eating breakfast.  He has not passed any further clots when voiding.  The patient wants to go home today.  + ambulation   +BM  Objective: Vital signs in last 24 hours: Temp:  [97.6 F (36.4 C)-99.8 F (37.7 C)] 97.6 F (36.4 C) (05/31 0445) Pulse Rate:  [68-74] 71 (05/31 0445) Cardiac Rhythm: Normal sinus rhythm (05/31 0325) Resp:  [16-20] 20 (05/31 0445) BP: (114-142)/(57-70) 142/70 (05/31 0445) SpO2:  [93 %-100 %] 98 % (05/31 0445) Weight:  [128.3 kg] 128.3 kg (05/31 0445)  Intake/Output from previous day: 05/30 0701 - 05/31 0700 In: 640 [P.O.:640] Out: 2925 [Urine:2925]  General appearance: alert, cooperative, and no distress Heart: regular rate and rhythm Lungs: clear to auscultation bilaterally Abdomen: soft, non-tender; bowel sounds normal; no masses,  no organomegaly Extremities: edema trace, improving Wound: clean and dry  Lab Results: Recent Labs    09/21/21 0333 09/22/21 0530  WBC 9.3 9.2  HGB 7.4* 7.8*  HCT 22.8* 24.0*  PLT 156 195   BMET:  Recent Labs    09/21/21 0333 09/22/21 0530  NA 134* 135  K 3.5 3.2*  CL 100 95*  CO2 26 30  GLUCOSE 162* 116*  BUN 32* 26*  CREATININE 1.16 1.16  CALCIUM 7.7* 8.1*    PT/INR:  Recent Labs    09/22/21 0530  LABPROT 16.6*  INR 1.4*   ABG    Component Value Date/Time   PHART 7.351 09/16/2021 2204   HCO3 23.8 09/16/2021 2204   TCO2 25 09/16/2021 2204   ACIDBASEDEF 2.0 09/16/2021 2204   O2SAT 98 09/16/2021 2204   CBG (last 3)  Recent Labs    09/19/21 1138 09/19/21 1641  GLUCAP 136* 152*    Assessment/Plan: S/P Procedure(s) (LRB): AORTIC  VALVE REPLACEMENT (AVR) (N/A) REPLACEMENT ASCENDING AORTA WITH 30 X 10MM HEMASHIELD PLATINUM WOVEN DOUBLE VELOUR VASCULAR GRAFT (N/A) TRANSESOPHAGEAL ECHOCARDIOGRAM (TEE) (N/A)  CV- PAF, maintaining NSR, mild HTN-continue oral Amiodarone, will resume Coreg at reduced dose, if needed can restart Cozaar for BP control INR 1.4. coumadin has been discontinued Pulm- no acute issues, continue IS Renal- creatinine stable, weight is trending down, continue Demadex, Zaroxolyn Hypokalemia- mild, due to aggressive diuresis.[name] will increase supplement today Post operative anemia- Hgb at 7.4, continue trinsicon, bladder issues are likely contributing to low hemoglobin, occult stool negative Dispo- patient maintaining NSR, will resume low dose Coreg for BP control and additional HR control, continue diuretics, supplement K.. if patient remains in NSR and potassium improves, will plan for d/c tomorrow   LOS: 6 days    Ellwood Handler, PA-C 09/22/2021   Chart reviewed, patient examined, agree with above. He feels well. Maintaining sinus so far on po amio. Will observe rhythm today and continue diuresis and K+ repletion and plan home tomorrow if no changes.

## 2021-09-22 NOTE — Progress Notes (Signed)
CARDIAC REHAB PHASE I   PRE:  Rate/Rhythm: 76 NSR  BP:  Sitting: 133/72      SaO2: 95 RA  MODE:  Ambulation: 230 ft   POST:  Rate/Rhythm: 91  BP:  Sitting: 152/71      SaO2: 97 RA  Seen pt from 1003-1049, Pt was ambulated through hallways with RW only needing minimal assistance. Pt is walking well with RW, gait is slow and steady, encouraged him to walk with wife at least two more times. Pt was educated on sternal precautions, IS use, ex guidelines, restrictions, and diet. Will f/u with CRPII tomorrow.    Luis [name]  10:42 AM 09/22/2021

## 2021-09-22 NOTE — Progress Notes (Signed)
Visit made to patients room to discuss CPAP.  Patient self administered hospital CPAP and is resting well.

## 2021-09-22 NOTE — Progress Notes (Addendum)
Mobility Specialist Progress Note    09/22/21 1611  Mobility  Activity Ambulated with assistance in hallway  Level of Assistance Standby assist, set-up cues, supervision of patient - no hands on  Assistive Device Front wheel walker  Distance Ambulated (ft) 240 ft  Activity Response Tolerated well  $Mobility charge 1 Mobility   During Mobility: 93 HR Post-Mobility: 84 HR  Pt received in chair and agreeable. No complaints on walk. Returned to bed with call bell in reach.    Hildred Alamin Mobility Specialist  Primary: 5N M.S. Phone: [phone] Secondary: 6N M.S. Phone: [phone]

## 2021-09-22 NOTE — Progress Notes (Signed)
Pt maintaining NSR.   Received dose of po amiodarone last night.  Stop IV amiodarone.    Taper amiodarone  400 mg BID x 1 week  200 mg BID x 1 week Then 200 mg daily.   Will make outpatient follow up with Dr. Lovena Le and plan to see as needed while remains here.   Legrand Como [address]" [place], PA-C  09/22/2021 7:11 AM

## 2021-09-22 NOTE — Progress Notes (Signed)
Went to ambulate pt at 1401, pt is sleeping. Will f/u tomorrow.

## 2021-09-23 LAB — BASIC METABOLIC PANEL
Anion gap: 10 (ref 5–15)
BUN: 24 mg/dL — ABNORMAL HIGH (ref 8–23)
CO2: 33 mmol/L — ABNORMAL HIGH (ref 22–32)
Calcium: 8.3 mg/dL — ABNORMAL LOW (ref 8.9–10.3)
Chloride: 93 mmol/L — ABNORMAL LOW (ref 98–111)
Creatinine, Ser: 1.11 mg/dL (ref 0.61–1.24)
GFR, Estimated: >60 mL/min (ref >60)
Glucose, Bld: 116 mg/dL — ABNORMAL HIGH (ref 70–99)
Potassium: 3.4 mmol/L — ABNORMAL LOW (ref 3.5–5.1)
Sodium: 136 mmol/L (ref 135–145)

## 2021-09-23 MED ORDER — ACETAMINOPHEN 500 MG PO TABS: 500.0000 mg | ORAL_TABLET | Freq: Four times a day (QID) | ORAL | 0 refills | Status: DC | PRN

## 2021-09-23 MED ORDER — AMIODARONE HCL 200 MG PO TABS: 400.0000 mg | ORAL_TABLET | Freq: Two times a day (BID) | ORAL | 1 refills | Status: DC

## 2021-09-23 MED ORDER — POTASSIUM CHLORIDE CRYS ER 20 MEQ PO TBCR: 40.0000 meq | EXTENDED_RELEASE_TABLET | Freq: Every day | ORAL | 3 refills | Status: DC

## 2021-09-23 MED ORDER — CENTRUM PO CHEW: 1.0000 | CHEWABLE_TABLET | Freq: Every day | ORAL | Status: AC

## 2021-09-23 MED ORDER — CARVEDILOL 3.125 MG PO TABS: 3.1250 mg | ORAL_TABLET | Freq: Two times a day (BID) | ORAL | 3 refills | Status: DC

## 2021-09-23 MED ORDER — TRAMADOL HCL 50 MG PO TABS
50.0000 mg | ORAL_TABLET | ORAL | 0 refills | Status: DC | PRN
Start: 2021-09-23 — End: 2021-10-03

## 2021-09-23 MED ORDER — DIGOXIN 250 MCG PO TABS: 0.2500 mg | ORAL_TABLET | Freq: Every day | ORAL | 3 refills | Status: DC

## 2021-09-23 MED ORDER — POTASSIUM CHLORIDE CRYS ER 20 MEQ PO TBCR
80.0000 meq | EXTENDED_RELEASE_TABLET | Freq: Once | ORAL | Status: AC
Start: 2021-09-23 — End: 2021-09-23
  Administered 2021-09-23: 80 meq via ORAL
  Filled 2021-09-23: qty 4

## 2021-09-23 NOTE — Progress Notes (Signed)
CARDIAC REHAB PHASE I   D/c education reinforced with pt and wife. Pt educated on site cre and restrictions. Encouraged continued IS use, walks, and sternal precautions. Questions and concerns addressed. Will refer to CRP II Coram Rufina Falco, RN BSN 09/23/2021 9:09 AM

## 2021-09-23 NOTE — Progress Notes (Addendum)
      [address]       [place] [address]             [phone]       7 Days Post-Op Procedure(s) (LRB): AORTIC VALVE REPLACEMENT (AVR) (N/A) REPLACEMENT ASCENDING AORTA WITH 30 X 10MM HEMASHIELD PLATINUM WOVEN DOUBLE VELOUR VASCULAR GRAFT (N/A) TRANSESOPHAGEAL ECHOCARDIOGRAM (TEE) (N/A)  Subjective:  Patient states he is ready to go home.  He has no complaints this morning.  + ambulation   + BM  Objective: Vital signs in last 24 hours: Temp:  [97.5 F (36.4 C)-98.5 F (36.9 C)] 97.7 F (36.5 C) (06/01 0425) Pulse Rate:  [68-77] 68 (06/01 0425) Cardiac Rhythm: Normal sinus rhythm (05/31 2325) Resp:  [16-20] 18 (06/01 0557) BP: (110-140)/(62-73) 119/62 (06/01 0425) SpO2:  [91 %-98 %] 95 % (06/01 0425) Weight:  [126.1 kg] 126.1 kg (06/01 0557)  Intake/Output from previous day: 05/31 0701 - 06/01 0700 In: 240 [P.O.:240] Out: 2650 [Urine:2650]  General appearance: alert, cooperative, and no distress Heart: regular rate and rhythm Lungs: clear to auscultation bilaterally Abdomen: soft, non-tender; bowel sounds normal; no masses,  no organomegaly Extremities: edema trace Wound: clean and dry  Lab Results: Recent Labs    09/21/21 0333 09/22/21 0530  WBC 9.3 9.2  HGB 7.4* 7.8*  HCT 22.8* 24.0*  PLT 156 195   BMET:  Recent Labs    09/22/21 0530 09/23/21 0530  NA 135 136  K 3.2* 3.4*  CL 95* 93*  CO2 30 33*  GLUCOSE 116* 116*  BUN 26* 24*  CREATININE 1.16 1.11  CALCIUM 8.1* 8.3*    PT/INR:  Recent Labs    09/22/21 0530  LABPROT 16.6*  INR 1.4*   ABG    Component Value Date/Time   PHART 7.351 09/16/2021 2204   HCO3 23.8 09/16/2021 2204   TCO2 25 09/16/2021 2204   ACIDBASEDEF 2.0 09/16/2021 2204   O2SAT 98 09/16/2021 2204   CBG (last 3)  No results for input(s): GLUCAP in the last 72 hours.  Assessment/Plan: S/P Procedure(s) (LRB): AORTIC VALVE REPLACEMENT (AVR) (N/A) REPLACEMENT ASCENDING AORTA WITH 30 X 10MM  HEMASHIELD PLATINUM WOVEN DOUBLE VELOUR VASCULAR GRAFT (N/A) TRANSESOPHAGEAL ECHOCARDIOGRAM (TEE) (N/A)  CV- PAF, maintaining NSR- continue Amiodarone, Coreg, Digoxin, not a candidate for coumadin as he is high risk of bleeding with hematuria related to bladder cancer ?possible recurrence Pulm- no acute issues, not requiring oxygen, continue IS Renal- creatinine has been stable, weight is at baseline, will stop Zaroxolyn, Demadex, resume home Lasix regimen Hypokalemia- improving, will continue supplementation Dispo- patient stable, maintaining NSR, will d/c home today   LOS: 7 days    Ellwood Handler, PA-C 09/23/2021   Chart reviewed, patient examined, agree with above. He remains in sinus rhythm. Wt is back to baseline. Plan home today.

## 2021-09-23 NOTE — Progress Notes (Signed)
Patient D/C home, sutures removed prior to D/C, D/C information provided to patient

## 2021-09-23 NOTE — Care Management Important Message (Signed)
Important Message  Patient Details  Name: NARAYAN SCULL MRN: [id number] Date of Birth: November 12, 1955   Medicare Important Message Given:  Yes     Shelda Altes 09/23/2021, 9:04 AM

## 2021-09-23 NOTE — Plan of Care (Signed)
  Problem: Education: Goal: Knowledge of General Education information will improve Description: Including pain rating scale, medication(s)/side effects and non-pharmacologic comfort measures Outcome: Adequate for Discharge   Problem: Health Behavior/Discharge Planning: Goal: Ability to manage health-related needs will improve Outcome: Adequate for Discharge   Problem: Clinical Measurements: Goal: Ability to maintain clinical measurements within normal limits will improve Outcome: Adequate for Discharge Goal: Will remain free from infection Outcome: Adequate for Discharge Goal: Diagnostic test results will improve Outcome: Adequate for Discharge Goal: Cardiovascular complication will be avoided Outcome: Adequate for Discharge   Problem: Coping: Goal: Level of anxiety will decrease Outcome: Adequate for Discharge   Problem: Elimination: Goal: Will not experience complications related to bowel motility Outcome: Adequate for Discharge Goal: Will not experience complications related to urinary retention Outcome: Adequate for Discharge   Problem: Cardiac: Goal: Will achieve and/or maintain hemodynamic stability Outcome: Adequate for Discharge   Problem: Respiratory: Goal: Respiratory status will improve Outcome: Adequate for Discharge   Problem: Urinary Elimination: Goal: Ability to achieve and maintain adequate renal perfusion and functioning will improve Outcome: Adequate for Discharge

## 2021-09-28 ENCOUNTER — Emergency Department (HOSPITAL_COMMUNITY): Payer: PPO

## 2021-09-28 ENCOUNTER — Encounter (HOSPITAL_COMMUNITY): Payer: Self-pay | Admitting: Emergency Medicine

## 2021-09-28 ENCOUNTER — Other Ambulatory Visit: Payer: Self-pay

## 2021-09-28 ENCOUNTER — Encounter (HOSPITAL_COMMUNITY)
Admission: EM | Disposition: A | Discharge status: Home / Self Care | Payer: Self-pay | Source: Home / Self Care | Attending: Surgery

## 2021-09-28 ENCOUNTER — Inpatient Hospital Stay (HOSPITAL_COMMUNITY): Payer: PPO

## 2021-09-28 ENCOUNTER — Inpatient Hospital Stay (HOSPITAL_COMMUNITY): Payer: PPO | Admitting: Anesthesiology

## 2021-09-28 ENCOUNTER — Inpatient Hospital Stay (HOSPITAL_COMMUNITY)
Admission: EM | Admit: 2021-09-28 | Discharge: 2021-10-03 | DRG: 271 | Disposition: A | Discharge status: Home / Self Care | Payer: PPO | Attending: Surgery | Admitting: Surgery

## 2021-09-28 DIAGNOSIS — I7121 Aneurysm of the ascending aorta, without rupture: Secondary | ICD-10-CM | POA: Diagnosis present

## 2021-09-28 DIAGNOSIS — I5032 Chronic diastolic (congestive) heart failure: Secondary | ICD-10-CM | POA: Diagnosis present

## 2021-09-28 DIAGNOSIS — Z7982 Long term (current) use of aspirin: Secondary | ICD-10-CM

## 2021-09-28 DIAGNOSIS — Z86010 Personal history of colonic polyps: Secondary | ICD-10-CM | POA: Diagnosis not present

## 2021-09-28 DIAGNOSIS — I110 Hypertensive heart disease with heart failure: Secondary | ICD-10-CM | POA: Diagnosis not present

## 2021-09-28 DIAGNOSIS — Z9889 Other specified postprocedural states: Secondary | ICD-10-CM

## 2021-09-28 DIAGNOSIS — M7989 Other specified soft tissue disorders: Secondary | ICD-10-CM | POA: Diagnosis not present

## 2021-09-28 DIAGNOSIS — I213 ST elevation (STEMI) myocardial infarction of unspecified site: Secondary | ICD-10-CM | POA: Diagnosis not present

## 2021-09-28 DIAGNOSIS — I509 Heart failure, unspecified: Secondary | ICD-10-CM | POA: Diagnosis not present

## 2021-09-28 DIAGNOSIS — W19XXXA Unspecified fall, initial encounter: Secondary | ICD-10-CM | POA: Diagnosis not present

## 2021-09-28 DIAGNOSIS — Z952 Presence of prosthetic heart valve: Secondary | ICD-10-CM | POA: Diagnosis not present

## 2021-09-28 DIAGNOSIS — K219 Gastro-esophageal reflux disease without esophagitis: Secondary | ICD-10-CM | POA: Diagnosis not present

## 2021-09-28 DIAGNOSIS — G4730 Sleep apnea, unspecified: Secondary | ICD-10-CM | POA: Diagnosis present

## 2021-09-28 DIAGNOSIS — R200 Anesthesia of skin: Secondary | ICD-10-CM | POA: Diagnosis not present

## 2021-09-28 DIAGNOSIS — G4733 Obstructive sleep apnea (adult) (pediatric): Secondary | ICD-10-CM | POA: Diagnosis not present

## 2021-09-28 DIAGNOSIS — Z87891 Personal history of nicotine dependence: Secondary | ICD-10-CM

## 2021-09-28 DIAGNOSIS — I639 Cerebral infarction, unspecified: Secondary | ICD-10-CM | POA: Diagnosis not present

## 2021-09-28 DIAGNOSIS — I3139 Other pericardial effusion (noninflammatory): Secondary | ICD-10-CM

## 2021-09-28 DIAGNOSIS — S0990XA Unspecified injury of head, initial encounter: Secondary | ICD-10-CM | POA: Diagnosis not present

## 2021-09-28 DIAGNOSIS — R079 Chest pain, unspecified: Secondary | ICD-10-CM | POA: Diagnosis not present

## 2021-09-28 DIAGNOSIS — Z981 Arthrodesis status: Secondary | ICD-10-CM

## 2021-09-28 DIAGNOSIS — Z6841 Body Mass Index (BMI) 40.0 and over, adult: Secondary | ICD-10-CM | POA: Diagnosis not present

## 2021-09-28 DIAGNOSIS — I2510 Atherosclerotic heart disease of native coronary artery without angina pectoris: Secondary | ICD-10-CM

## 2021-09-28 DIAGNOSIS — I10 Essential (primary) hypertension: Secondary | ICD-10-CM | POA: Diagnosis not present

## 2021-09-28 DIAGNOSIS — R55 Syncope and collapse: Secondary | ICD-10-CM | POA: Diagnosis not present

## 2021-09-28 DIAGNOSIS — E785 Hyperlipidemia, unspecified: Secondary | ICD-10-CM | POA: Diagnosis not present

## 2021-09-28 DIAGNOSIS — J9811 Atelectasis: Secondary | ICD-10-CM | POA: Diagnosis not present

## 2021-09-28 DIAGNOSIS — I9789 Other postprocedural complications and disorders of the circulatory system, not elsewhere classified: Secondary | ICD-10-CM | POA: Diagnosis present

## 2021-09-28 DIAGNOSIS — R319 Hematuria, unspecified: Secondary | ICD-10-CM | POA: Diagnosis present

## 2021-09-28 DIAGNOSIS — I252 Old myocardial infarction: Secondary | ICD-10-CM | POA: Diagnosis not present

## 2021-09-28 DIAGNOSIS — Z801 Family history of malignant neoplasm of trachea, bronchus and lung: Secondary | ICD-10-CM

## 2021-09-28 DIAGNOSIS — I480 Paroxysmal atrial fibrillation: Secondary | ICD-10-CM | POA: Diagnosis present

## 2021-09-28 DIAGNOSIS — Z955 Presence of coronary angioplasty implant and graft: Secondary | ICD-10-CM

## 2021-09-28 DIAGNOSIS — Y92239 Unspecified place in hospital as the place of occurrence of the external cause: Secondary | ICD-10-CM | POA: Diagnosis not present

## 2021-09-28 DIAGNOSIS — D62 Acute posthemorrhagic anemia: Secondary | ICD-10-CM | POA: Diagnosis not present

## 2021-09-28 DIAGNOSIS — K760 Fatty (change of) liver, not elsewhere classified: Secondary | ICD-10-CM | POA: Diagnosis not present

## 2021-09-28 DIAGNOSIS — Z885 Allergy status to narcotic agent status: Secondary | ICD-10-CM

## 2021-09-28 DIAGNOSIS — I314 Cardiac tamponade: Secondary | ICD-10-CM | POA: Diagnosis present

## 2021-09-28 DIAGNOSIS — T462X5A Adverse effect of other antidysrhythmic drugs, initial encounter: Secondary | ICD-10-CM | POA: Diagnosis not present

## 2021-09-28 DIAGNOSIS — Z8551 Personal history of malignant neoplasm of bladder: Secondary | ICD-10-CM

## 2021-09-28 DIAGNOSIS — Z9221 Personal history of antineoplastic chemotherapy: Secondary | ICD-10-CM

## 2021-09-28 DIAGNOSIS — R404 Transient alteration of awareness: Secondary | ICD-10-CM | POA: Diagnosis not present

## 2021-09-28 DIAGNOSIS — I4891 Unspecified atrial fibrillation: Secondary | ICD-10-CM | POA: Diagnosis not present

## 2021-09-28 DIAGNOSIS — Z79899 Other long term (current) drug therapy: Secondary | ICD-10-CM

## 2021-09-28 DIAGNOSIS — I959 Hypotension, unspecified: Secondary | ICD-10-CM | POA: Diagnosis not present

## 2021-09-28 DIAGNOSIS — Z800 Family history of malignant neoplasm of digestive organs: Secondary | ICD-10-CM

## 2021-09-28 DIAGNOSIS — N281 Cyst of kidney, acquired: Secondary | ICD-10-CM | POA: Diagnosis not present

## 2021-09-28 DIAGNOSIS — N3289 Other specified disorders of bladder: Secondary | ICD-10-CM | POA: Diagnosis not present

## 2021-09-28 DIAGNOSIS — R0602 Shortness of breath: Secondary | ICD-10-CM | POA: Diagnosis not present

## 2021-09-28 DIAGNOSIS — I440 Atrioventricular block, first degree: Secondary | ICD-10-CM | POA: Diagnosis present

## 2021-09-28 DIAGNOSIS — G2581 Restless legs syndrome: Secondary | ICD-10-CM | POA: Diagnosis not present

## 2021-09-28 DIAGNOSIS — M170 Bilateral primary osteoarthritis of knee: Secondary | ICD-10-CM | POA: Diagnosis present

## 2021-09-28 DIAGNOSIS — R0902 Hypoxemia: Secondary | ICD-10-CM | POA: Diagnosis not present

## 2021-09-28 DIAGNOSIS — Z9049 Acquired absence of other specified parts of digestive tract: Secondary | ICD-10-CM

## 2021-09-28 DIAGNOSIS — F411 Generalized anxiety disorder: Secondary | ICD-10-CM | POA: Diagnosis not present

## 2021-09-28 DIAGNOSIS — J90 Pleural effusion, not elsewhere classified: Secondary | ICD-10-CM | POA: Diagnosis not present

## 2021-09-28 DIAGNOSIS — R110 Nausea: Secondary | ICD-10-CM | POA: Diagnosis not present

## 2021-09-28 HISTORY — DX: Malignant (primary) neoplasm, unspecified: C80.1

## 2021-09-28 HISTORY — PX: PERICARDIOCENTESIS: CATH118255

## 2021-09-28 HISTORY — PX: TEE WITHOUT CARDIOVERSION: SHX5443

## 2021-09-28 HISTORY — PX: SUBXYPHOID PERICARDIAL WINDOW: SHX5075

## 2021-09-28 LAB — ECHOCARDIOGRAM COMPLETE
AR max vel: 2.3 cm2
AV Area VTI: 2.02 cm2
AV Area mean vel: 2.19 cm2
AV Mean grad: 10 mmHg
AV Peak grad: 20.9 mmHg
Ao pk vel: 2.28 m/s
S' Lateral: 3.3 cm

## 2021-09-28 LAB — CBC WITH DIFFERENTIAL/PLATELET
Abs Immature Granulocytes: 0.32 10*3/uL — ABNORMAL HIGH (ref 0.00–0.07)
Basophils Absolute: 0.1 10*3/uL (ref 0.0–0.1)
Basophils Relative: 1 %
Eosinophils Absolute: 0.3 10*3/uL (ref 0.0–0.5)
Eosinophils Relative: 2 %
HCT: 26.1 % — ABNORMAL LOW (ref 39.0–52.0)
Hemoglobin: 8 g/dL — ABNORMAL LOW (ref 13.0–17.0)
Immature Granulocytes: 2 %
Lymphocytes Relative: 17 %
Lymphs Abs: 2.7 10*3/uL (ref 0.7–4.0)
MCH: 29.2 pg (ref 26.0–34.0)
MCHC: 30.7 g/dL (ref 30.0–36.0)
MCV: 95.3 fL (ref 80.0–100.0)
Monocytes Absolute: 1.1 10*3/uL — ABNORMAL HIGH (ref 0.1–1.0)
Monocytes Relative: 7 %
Neutro Abs: 11.1 10*3/uL — ABNORMAL HIGH (ref 1.7–7.7)
Neutrophils Relative %: 71 %
Platelets: 340 10*3/uL (ref 150–400)
RBC: 2.74 MIL/uL — ABNORMAL LOW (ref 4.22–5.81)
RDW: 14.8 % (ref 11.5–15.5)
WBC: 15.5 10*3/uL — ABNORMAL HIGH (ref 4.0–10.5)
nRBC: 0.2 % (ref 0.0–0.2)

## 2021-09-28 LAB — HEPATIC FUNCTION PANEL
ALT: 92 U/L — ABNORMAL HIGH (ref 0–44)
AST: 69 U/L — ABNORMAL HIGH (ref 15–41)
Albumin: 2.8 g/dL — ABNORMAL LOW (ref 3.5–5.0)
Alkaline Phosphatase: 124 U/L (ref 38–126)
Bilirubin, Direct: 0.3 mg/dL — ABNORMAL HIGH (ref 0.0–0.2)
Indirect Bilirubin: 0.7 mg/dL (ref 0.3–0.9)
Total Bilirubin: 1 mg/dL (ref 0.3–1.2)
Total Protein: 6.2 g/dL — ABNORMAL LOW (ref 6.5–8.1)

## 2021-09-28 LAB — PROTIME-INR
INR: 1.3 — ABNORMAL HIGH (ref 0.8–1.2)
Prothrombin Time: 15.9 seconds — ABNORMAL HIGH (ref 11.4–15.2)

## 2021-09-28 LAB — CBC
HCT: 26.2 % — ABNORMAL LOW (ref 39.0–52.0)
Hemoglobin: 8.2 g/dL — ABNORMAL LOW (ref 13.0–17.0)
MCH: 29.3 pg (ref 26.0–34.0)
MCHC: 31.3 g/dL (ref 30.0–36.0)
MCV: 93.6 fL (ref 80.0–100.0)
Platelets: 324 10*3/uL (ref 150–400)
RBC: 2.8 MIL/uL — ABNORMAL LOW (ref 4.22–5.81)
RDW: 15.1 % (ref 11.5–15.5)
WBC: 16.9 10*3/uL — ABNORMAL HIGH (ref 4.0–10.5)
nRBC: 0 % (ref 0.0–0.2)

## 2021-09-28 LAB — BASIC METABOLIC PANEL
Anion gap: 12 (ref 5–15)
Anion gap: 7 (ref 5–15)
Anion gap: 8 (ref 5–15)
BUN: 22 mg/dL (ref 8–23)
BUN: 22 mg/dL (ref 8–23)
BUN: 22 mg/dL (ref 8–23)
CO2: 23 mmol/L (ref 22–32)
CO2: 27 mmol/L (ref 22–32)
CO2: 26 mmol/L (ref 22–32)
Calcium: 8.1 mg/dL — ABNORMAL LOW (ref 8.9–10.3)
Calcium: 7.9 mg/dL — ABNORMAL LOW (ref 8.9–10.3)
Calcium: 7.9 mg/dL — ABNORMAL LOW (ref 8.9–10.3)
Chloride: 100 mmol/L (ref 98–111)
Chloride: 103 mmol/L (ref 98–111)
Chloride: 102 mmol/L (ref 98–111)
Creatinine, Ser: 1.35 mg/dL — ABNORMAL HIGH (ref 0.61–1.24)
Creatinine, Ser: 1.16 mg/dL (ref 0.61–1.24)
Creatinine, Ser: 1.13 mg/dL (ref 0.61–1.24)
GFR, Estimated: 58 mL/min — ABNORMAL LOW (ref >60)
GFR, Estimated: >60 mL/min (ref >60)
GFR, Estimated: >60 mL/min (ref >60)
Glucose, Bld: 179 mg/dL — ABNORMAL HIGH (ref 70–99)
Glucose, Bld: 126 mg/dL — ABNORMAL HIGH (ref 70–99)
Glucose, Bld: 159 mg/dL — ABNORMAL HIGH (ref 70–99)
Potassium: 4.1 mmol/L (ref 3.5–5.1)
Potassium: 4.5 mmol/L (ref 3.5–5.1)
Potassium: 4.6 mmol/L (ref 3.5–5.1)
Sodium: 135 mmol/L (ref 135–145)
Sodium: 137 mmol/L (ref 135–145)
Sodium: 136 mmol/L (ref 135–145)

## 2021-09-28 LAB — BRAIN NATRIURETIC PEPTIDE: B Natriuretic Peptide: 171 pg/mL — ABNORMAL HIGH (ref 0.0–100.0)

## 2021-09-28 LAB — LIPASE, BLOOD: Lipase: 39 U/L (ref 11–51)

## 2021-09-28 LAB — TROPONIN I (HIGH SENSITIVITY)
Troponin I (High Sensitivity): 46 ng/L — ABNORMAL HIGH (ref <18)
Troponin I (High Sensitivity): 43 ng/L — ABNORMAL HIGH (ref <18)

## 2021-09-28 LAB — APTT: aPTT: 34 seconds (ref 24–36)

## 2021-09-28 LAB — PREPARE RBC (CROSSMATCH)

## 2021-09-28 LAB — ECHOCARDIOGRAM LIMITED

## 2021-09-28 LAB — DIGOXIN LEVEL: Digoxin Level: 1.3 ng/mL (ref 0.8–2.0)

## 2021-09-28 LAB — GLUCOSE, CAPILLARY
Glucose-Capillary: 131 mg/dL — ABNORMAL HIGH (ref 70–99)
Glucose-Capillary: 159 mg/dL — ABNORMAL HIGH (ref 70–99)

## 2021-09-28 SURGERY — PERICARDIOCENTESIS
Anesthesia: LOCAL

## 2021-09-28 SURGERY — CREATION, PERICARDIAL WINDOW, SUBXIPHOID APPROACH
Anesthesia: General

## 2021-09-28 MED ORDER — KETAMINE HCL 10 MG/ML IJ SOLN
INTRAMUSCULAR | Status: DC | PRN
  Administered 2021-09-28: 50 mg via INTRAVENOUS

## 2021-09-28 MED ORDER — PROPOFOL 10 MG/ML IV BOLUS
INTRAVENOUS | Status: AC
  Filled 2021-09-28: qty 20

## 2021-09-28 MED ORDER — TAMSULOSIN HCL 0.4 MG PO CAPS
0.4000 mg | ORAL_CAPSULE | Freq: Every day | ORAL | Status: DC
  Administered 2021-09-29 – 2021-10-03 (×5): 0.4 mg via ORAL
  Filled 2021-09-28 (×5): qty 1

## 2021-09-28 MED ORDER — IOHEXOL 350 MG/ML SOLN
100.0000 mL | Freq: Once | INTRAVENOUS | Status: AC | PRN
  Administered 2021-09-28: 100 mL via INTRAVENOUS

## 2021-09-28 MED ORDER — ASPIRIN 81 MG PO TBEC
81.0000 mg | DELAYED_RELEASE_TABLET | ORAL | Status: DC
  Administered 2021-09-29 – 2021-10-03 (×5): 81 mg via ORAL
  Filled 2021-09-28 (×5): qty 1

## 2021-09-28 MED ORDER — ACETAMINOPHEN 160 MG/5ML PO SOLN
1000.0000 mg | Freq: Four times a day (QID) | ORAL | Status: DC
  Filled 2021-09-28: qty 40.6

## 2021-09-28 MED ORDER — ONDANSETRON HCL 4 MG/2ML IJ SOLN
4.0000 mg | Freq: Four times a day (QID) | INTRAMUSCULAR | Status: DC | PRN
Start: 2021-09-28 — End: 2021-09-30

## 2021-09-28 MED ORDER — MIDAZOLAM HCL 2 MG/2ML IJ SOLN
INTRAMUSCULAR | Status: AC
  Filled 2021-09-28: qty 2

## 2021-09-28 MED ORDER — FENTANYL CITRATE (PF) 250 MCG/5ML IJ SOLN
INTRAMUSCULAR | Status: AC
  Filled 2021-09-28: qty 5

## 2021-09-28 MED ORDER — SUCCINYLCHOLINE CHLORIDE 200 MG/10ML IV SOSY
PREFILLED_SYRINGE | INTRAVENOUS | Status: DC | PRN
  Administered 2021-09-28: 120 mg via INTRAVENOUS

## 2021-09-28 MED ORDER — LACTATED RINGERS IV SOLN: INTRAVENOUS | Status: DC | PRN

## 2021-09-28 MED ORDER — SODIUM CHLORIDE 0.9% FLUSH
3.0000 mL | Freq: Two times a day (BID) | INTRAVENOUS | Status: DC
  Administered 2021-09-28 – 2021-09-29 (×2): 3 mL via INTRAVENOUS
  Administered 2021-09-29: 10 mL via INTRAVENOUS
  Administered 2021-09-30: 3 mL via INTRAVENOUS

## 2021-09-28 MED ORDER — INSULIN ASPART 100 UNIT/ML IJ SOLN
0.0000 [IU] | INTRAMUSCULAR | Status: DC
  Administered 2021-09-28 – 2021-09-29 (×4): 2 [IU] via SUBCUTANEOUS

## 2021-09-28 MED ORDER — VENLAFAXINE HCL ER 75 MG PO CP24
150.0000 mg | ORAL_CAPSULE | Freq: Every day | ORAL | Status: DC
  Administered 2021-09-29 – 2021-10-03 (×5): 150 mg via ORAL
  Filled 2021-09-28 (×2): qty 2
  Filled 2021-09-28 (×2): qty 1
  Filled 2021-09-28: qty 2

## 2021-09-28 MED ORDER — DEXTROSE-NACL 5-0.9 % IV SOLN: INTRAVENOUS | Status: DC

## 2021-09-28 MED ORDER — LACTATED RINGERS IV SOLN: INTRAVENOUS | Status: DC

## 2021-09-28 MED ORDER — HYDRALAZINE HCL 20 MG/ML IJ SOLN: 10.0000 mg | INTRAMUSCULAR | Status: DC | PRN

## 2021-09-28 MED ORDER — FENTANYL CITRATE (PF) 100 MCG/2ML IJ SOLN
INTRAMUSCULAR | Status: AC
  Filled 2021-09-28: qty 2

## 2021-09-28 MED ORDER — PROPOFOL 10 MG/ML IV BOLUS
INTRAVENOUS | Status: DC | PRN
  Administered 2021-09-28 (×2): 50 mg via INTRAVENOUS

## 2021-09-28 MED ORDER — ONDANSETRON HCL 4 MG/2ML IJ SOLN: 4.0000 mg | Freq: Four times a day (QID) | INTRAMUSCULAR | Status: DC | PRN

## 2021-09-28 MED ORDER — FENTANYL CITRATE PF 50 MCG/ML IJ SOSY
50.0000 ug | PREFILLED_SYRINGE | INTRAMUSCULAR | Status: DC | PRN
  Administered 2021-09-28: 50 ug via INTRAVENOUS
  Filled 2021-09-28: qty 1

## 2021-09-28 MED ORDER — ROSUVASTATIN CALCIUM 20 MG PO TABS
20.0000 mg | ORAL_TABLET | Freq: Every day | ORAL | Status: DC
  Administered 2021-09-29 – 2021-10-03 (×5): 20 mg via ORAL
  Filled 2021-09-28 (×5): qty 1

## 2021-09-28 MED ORDER — PHENYLEPHRINE 80 MCG/ML (10ML) SYRINGE FOR IV PUSH (FOR BLOOD PRESSURE SUPPORT)
PREFILLED_SYRINGE | INTRAVENOUS | Status: DC | PRN
  Administered 2021-09-28: 80 ug via INTRAVENOUS

## 2021-09-28 MED ORDER — ROSUVASTATIN CALCIUM 20 MG PO TABS: 20.0000 mg | ORAL_TABLET | Freq: Every day | ORAL | Status: DC

## 2021-09-28 MED ORDER — MIDAZOLAM HCL 2 MG/2ML IJ SOLN
INTRAMUSCULAR | Status: DC | PRN
  Administered 2021-09-28: 2 mg via INTRAVENOUS

## 2021-09-28 MED ORDER — PHENYLEPHRINE HCL-NACL 20-0.9 MG/250ML-% IV SOLN
INTRAVENOUS | Status: DC | PRN
  Administered 2021-09-28: 40 ug/min via INTRAVENOUS

## 2021-09-28 MED ORDER — AMIODARONE HCL 200 MG PO TABS: 400.0000 mg | ORAL_TABLET | Freq: Two times a day (BID) | ORAL | Status: DC

## 2021-09-28 MED ORDER — VENLAFAXINE HCL ER 150 MG PO CP24: 150.0000 mg | ORAL_CAPSULE | Freq: Every day | ORAL | Status: DC

## 2021-09-28 MED ORDER — ONDANSETRON HCL 4 MG/2ML IJ SOLN
INTRAMUSCULAR | Status: DC | PRN
  Administered 2021-09-28: 4 mg via INTRAVENOUS

## 2021-09-28 MED ORDER — DEXAMETHASONE SODIUM PHOSPHATE 10 MG/ML IJ SOLN
INTRAMUSCULAR | Status: DC | PRN
  Administered 2021-09-28: 5 mg via INTRAVENOUS

## 2021-09-28 MED ORDER — CHLORHEXIDINE GLUCONATE 0.12 % MT SOLN
OROMUCOSAL | Status: AC
  Administered 2021-09-28: 15 mL via OROMUCOSAL
  Filled 2021-09-28: qty 15

## 2021-09-28 MED ORDER — BISACODYL 5 MG PO TBEC
10.0000 mg | DELAYED_RELEASE_TABLET | Freq: Every day | ORAL | Status: DC
  Administered 2021-09-28 – 2021-10-03 (×4): 10 mg via ORAL
  Filled 2021-09-28 (×5): qty 2

## 2021-09-28 MED ORDER — SODIUM CHLORIDE 0.9 % IV SOLN: 250.0000 mL | INTRAVENOUS | Status: DC | PRN

## 2021-09-28 MED ORDER — MIDAZOLAM HCL 2 MG/2ML IJ SOLN
INTRAMUSCULAR | Status: DC | PRN
  Administered 2021-09-28 (×2): 1 mg via INTRAVENOUS

## 2021-09-28 MED ORDER — SODIUM CHLORIDE 0.9 % IV SOLN
10.0000 mL/h | Freq: Once | INTRAVENOUS | Status: AC
  Administered 2021-09-28: 10 mL/h via INTRAVENOUS

## 2021-09-28 MED ORDER — AMIODARONE HCL 200 MG PO TABS
400.0000 mg | ORAL_TABLET | Freq: Two times a day (BID) | ORAL | Status: DC
  Administered 2021-09-28 – 2021-09-29 (×4): 400 mg via ORAL
  Filled 2021-09-28 (×4): qty 2

## 2021-09-28 MED ORDER — SODIUM CHLORIDE 0.9% FLUSH
3.0000 mL | INTRAVENOUS | Status: DC | PRN
Start: 2021-09-28 — End: 2021-09-30

## 2021-09-28 MED ORDER — FENTANYL CITRATE PF 50 MCG/ML IJ SOSY
25.0000 ug | PREFILLED_SYRINGE | INTRAMUSCULAR | Status: DC | PRN
  Administered 2021-09-28 – 2021-09-30 (×4): 50 ug via INTRAVENOUS
  Filled 2021-09-28 (×4): qty 1

## 2021-09-28 MED ORDER — CHLORHEXIDINE GLUCONATE CLOTH 2 % EX PADS
6.0000 | MEDICATED_PAD | Freq: Every day | CUTANEOUS | Status: DC
  Administered 2021-09-28 – 2021-09-30 (×3): 6 via TOPICAL

## 2021-09-28 MED ORDER — FENTANYL CITRATE (PF) 250 MCG/5ML IJ SOLN
INTRAMUSCULAR | Status: DC | PRN
  Administered 2021-09-28 (×2): 25 ug via INTRAVENOUS

## 2021-09-28 MED ORDER — CHLORHEXIDINE GLUCONATE 0.12 % MT SOLN: 15.0000 mL | Freq: Once | OROMUCOSAL | Status: AC

## 2021-09-28 MED ORDER — 0.9 % SODIUM CHLORIDE (POUR BTL) OPTIME
TOPICAL | Status: DC | PRN
  Administered 2021-09-28: 2000 mL

## 2021-09-28 MED ORDER — METOCLOPRAMIDE HCL 5 MG/ML IJ SOLN
10.0000 mg | Freq: Four times a day (QID) | INTRAMUSCULAR | Status: AC
  Administered 2021-09-28 – 2021-09-29 (×4): 10 mg via INTRAVENOUS
  Filled 2021-09-28 (×4): qty 2

## 2021-09-28 MED ORDER — ACETAMINOPHEN 325 MG PO TABS: 650.0000 mg | ORAL_TABLET | ORAL | Status: DC | PRN

## 2021-09-28 MED ORDER — TAMSULOSIN HCL 0.4 MG PO CAPS: 0.4000 mg | ORAL_CAPSULE | Freq: Every day | ORAL | Status: DC

## 2021-09-28 MED ORDER — DEXTROSE 5 % IV SOLN
INTRAVENOUS | Status: DC | PRN
  Administered 2021-09-28: 3 g via INTRAVENOUS

## 2021-09-28 MED ORDER — SODIUM CHLORIDE 0.9 % IV BOLUS
500.0000 mL | Freq: Once | INTRAVENOUS | Status: AC
  Administered 2021-09-28: 500 mL via INTRAVENOUS

## 2021-09-28 MED ORDER — CEFAZOLIN SODIUM-DEXTROSE 2-4 GM/100ML-% IV SOLN
2.0000 g | Freq: Three times a day (TID) | INTRAVENOUS | Status: AC
  Administered 2021-09-28 – 2021-09-29 (×2): 2 g via INTRAVENOUS
  Filled 2021-09-28 (×2): qty 100

## 2021-09-28 MED ORDER — ROCURONIUM BROMIDE 10 MG/ML (PF) SYRINGE
PREFILLED_SYRINGE | INTRAVENOUS | Status: DC | PRN
  Administered 2021-09-28: 40 mg via INTRAVENOUS

## 2021-09-28 MED ORDER — SENNOSIDES-DOCUSATE SODIUM 8.6-50 MG PO TABS
1.0000 | ORAL_TABLET | Freq: Every day | ORAL | Status: DC
  Administered 2021-09-28 – 2021-09-30 (×3): 1 via ORAL
  Filled 2021-09-28 (×3): qty 1

## 2021-09-28 MED ORDER — ORAL CARE MOUTH RINSE: 15.0000 mL | Freq: Once | OROMUCOSAL | Status: AC

## 2021-09-28 MED ORDER — ASPIRIN 81 MG PO TBEC: 81.0000 mg | DELAYED_RELEASE_TABLET | Freq: Every day | ORAL | Status: DC

## 2021-09-28 MED ORDER — SODIUM CHLORIDE 0.9 % IV SOLN: INTRAVENOUS | Status: DC

## 2021-09-28 MED ORDER — KETAMINE HCL 50 MG/5ML IJ SOSY
PREFILLED_SYRINGE | INTRAMUSCULAR | Status: AC
  Filled 2021-09-28: qty 5

## 2021-09-28 MED ORDER — LABETALOL HCL 5 MG/ML IV SOLN: 10.0000 mg | INTRAVENOUS | Status: DC | PRN

## 2021-09-28 MED ORDER — PHENYLEPHRINE HCL-NACL 20-0.9 MG/250ML-% IV SOLN
INTRAVENOUS | Status: AC
  Filled 2021-09-28: qty 250

## 2021-09-28 MED ORDER — TRAMADOL HCL 50 MG PO TABS: 50.0000 mg | ORAL_TABLET | Freq: Four times a day (QID) | ORAL | Status: DC | PRN

## 2021-09-28 MED ORDER — ACETAMINOPHEN 500 MG PO TABS
1000.0000 mg | ORAL_TABLET | Freq: Four times a day (QID) | ORAL | Status: DC
  Administered 2021-09-28 – 2021-10-03 (×14): 1000 mg via ORAL
  Filled 2021-09-28 (×13): qty 2

## 2021-09-28 MED ORDER — NITROGLYCERIN 0.4 MG SL SUBL: 0.4000 mg | SUBLINGUAL_TABLET | SUBLINGUAL | Status: DC | PRN

## 2021-09-28 MED ORDER — SUGAMMADEX SODIUM 200 MG/2ML IV SOLN
INTRAVENOUS | Status: DC | PRN
  Administered 2021-09-28: 200 mg via INTRAVENOUS

## 2021-09-28 MED ORDER — TRAMADOL HCL 50 MG PO TABS
50.0000 mg | ORAL_TABLET | Freq: Four times a day (QID) | ORAL | Status: DC | PRN
  Administered 2021-09-29: 100 mg via ORAL
  Filled 2021-09-28: qty 2

## 2021-09-28 MED ORDER — LIDOCAINE HCL (PF) 1 % IJ SOLN
INTRAMUSCULAR | Status: AC
  Filled 2021-09-28: qty 30

## 2021-09-28 MED ORDER — FENTANYL CITRATE (PF) 100 MCG/2ML IJ SOLN
INTRAMUSCULAR | Status: DC | PRN
Start: 2021-09-28 — End: 2021-09-28
  Administered 2021-09-28 (×2): 25 ug via INTRAVENOUS

## 2021-09-28 SURGICAL SUPPLY — 50 items
BLADE CLIPPER SURG (BLADE) ×2 IMPLANT
CANISTER SUCT 3000ML PPV (MISCELLANEOUS) ×2 IMPLANT
CATH THORACIC 28FR (CATHETERS) IMPLANT
CATH THORACIC 28FR RT ANG (CATHETERS) IMPLANT
CATH THORACIC 36FR (CATHETERS) IMPLANT
CATH THORACIC 36FR RT ANG (CATHETERS) IMPLANT
CLIP TI MEDIUM 6 (CLIP) ×1 IMPLANT
CLIP TI WIDE RED SMALL 6 (CLIP) ×1 IMPLANT
CNTNR URN SCR LID CUP LEK RST (MISCELLANEOUS) ×1 IMPLANT
CONT SPEC 4OZ STRL OR WHT (MISCELLANEOUS) ×2
DRAIN CHANNEL 28F RND 3/8 FF (WOUND CARE) IMPLANT
DRAIN CHANNEL 32F RND 10.7 FF (WOUND CARE) ×1 IMPLANT
DRAPE LAPAROSCOPIC ABDOMINAL (DRAPES) ×2 IMPLANT
DRAPE WARM FLUID 44X44 (DRAPES) ×1 IMPLANT
DRSG COVADERM 4X6 (GAUZE/BANDAGES/DRESSINGS) ×1 IMPLANT
ELECT REM PT RETURN 9FT ADLT (ELECTROSURGICAL) ×2
ELECTRODE REM PT RTRN 9FT ADLT (ELECTROSURGICAL) ×1 IMPLANT
GAUZE 4X4 16PLY ~~LOC~~+RFID DBL (SPONGE) ×1 IMPLANT
GAUZE SPONGE 4X4 12PLY STRL (GAUZE/BANDAGES/DRESSINGS) ×2 IMPLANT
GAUZE SPONGE 4X4 12PLY STRL LF (GAUZE/BANDAGES/DRESSINGS) ×1 IMPLANT
GLOVE SURG MICRO LTX SZ7 (GLOVE) ×2 IMPLANT
GOWN STRL REUS W/ TWL LRG LVL3 (GOWN DISPOSABLE) ×1 IMPLANT
GOWN STRL REUS W/ TWL XL LVL3 (GOWN DISPOSABLE) ×1 IMPLANT
GOWN STRL REUS W/TWL LRG LVL3 (GOWN DISPOSABLE) ×2
GOWN STRL REUS W/TWL XL LVL3 (GOWN DISPOSABLE) ×2
KIT BASIN OR (CUSTOM PROCEDURE TRAY) ×2 IMPLANT
KIT TURNOVER KIT B (KITS) ×2 IMPLANT
NS IRRIG 1000ML POUR BTL (IV SOLUTION) ×2 IMPLANT
PACK CHEST (CUSTOM PROCEDURE TRAY) ×2 IMPLANT
PAD ARMBOARD 7.5X6 YLW CONV (MISCELLANEOUS) ×4 IMPLANT
PAD ELECT DEFIB RADIOL ZOLL (MISCELLANEOUS) ×2 IMPLANT
SPONGE T-LAP 18X18 ~~LOC~~+RFID (SPONGE) ×4 IMPLANT
SPONGE T-LAP 4X18 ~~LOC~~+RFID (SPONGE) ×1 IMPLANT
SUT VIC AB 0 CT1 18XCR BRD 8 (SUTURE) ×1 IMPLANT
SUT VIC AB 0 CT1 8-18 (SUTURE) ×2
SUT VIC AB 1 CTX 18 (SUTURE) IMPLANT
SUT VIC AB 2-0 CT1 27 (SUTURE) ×2
SUT VIC AB 2-0 CT1 TAPERPNT 27 (SUTURE) IMPLANT
SUT VIC AB 3-0 X1 27 (SUTURE) IMPLANT
SWAB COLLECTION DEVICE MRSA (MISCELLANEOUS) IMPLANT
SWAB CULTURE ESWAB REG 1ML (MISCELLANEOUS) IMPLANT
SYR 10ML LL (SYRINGE) IMPLANT
SYR 50ML SLIP (SYRINGE) IMPLANT
SYSTEM SAHARA CHEST DRAIN ATS (WOUND CARE) ×1 IMPLANT
TAPE CLOTH SURG 4X10 WHT LF (GAUZE/BANDAGES/DRESSINGS) ×1 IMPLANT
TOWEL GREEN STERILE (TOWEL DISPOSABLE) ×2 IMPLANT
TOWEL GREEN STERILE FF (TOWEL DISPOSABLE) ×2 IMPLANT
TRAP SPECIMEN MUCUS 40CC (MISCELLANEOUS) ×1 IMPLANT
TRAY FOLEY SLVR 16FR TEMP STAT (SET/KITS/TRAYS/PACK) ×2 IMPLANT
WATER STERILE IRR 1000ML POUR (IV SOLUTION) ×2 IMPLANT

## 2021-09-28 SURGICAL SUPPLY — 6 items
PACK CARDIAC CATHETERIZATION (CUSTOM PROCEDURE TRAY) ×1 IMPLANT
PROTECTION STATION PRESSURIZED (MISCELLANEOUS) ×2
SHEATH PROBE COVER 6X72 (BAG) ×1 IMPLANT
STATION PROTECTION PRESSURIZED (MISCELLANEOUS) IMPLANT
TRAY PERICARDIOCENTESIS 6FX60 (TRAY / TRAY PROCEDURE) ×1 IMPLANT
WIRE EMERALD 3MM-J .035X150CM (WIRE) ×1 IMPLANT

## 2021-09-28 NOTE — Progress Notes (Signed)
  Echocardiogram 2D Echocardiogram has been performed.  Luis [name] 09/28/2021, 7:58 AM

## 2021-09-28 NOTE — Interval H&P Note (Signed)
History and Physical Interval Note:  09/28/2021 9:06 AM  Luis [name]  has presented today for surgery, with the diagnosis of pericariocentesis.  The various methods of treatment have been discussed with the patient and family. After consideration of risks, benefits and other options for treatment, the patient has consented to  Procedure(s): PERICARDIOCENTESIS (N/A) as a surgical intervention.  The patient's history has been reviewed, patient examined, no change in status, stable for surgery.  I have reviewed the patient's chart and labs.  Questions were answered to the patient's satisfaction.     Larae Grooms

## 2021-09-28 NOTE — Anesthesia Procedure Notes (Signed)
Procedure Name: Intubation Date/Time: 09/28/2021 3:56 PM Performed by: Erick Colace, CRNA Pre-anesthesia Checklist: Patient identified, Emergency Drugs available, Suction available and Patient being monitored Patient Re-evaluated:Patient Re-evaluated prior to induction Oxygen Delivery Method: Circle system utilized Preoxygenation: Pre-oxygenation with 100% oxygen Induction Type: IV induction Ventilation: Mask ventilation without difficulty Laryngoscope Size: Glidescope and 4 Grade View: Grade I Tube type: Oral Tube size: 7.5 mm Number of attempts: 1 Airway Equipment and Method: Stylet and Oral airway Placement Confirmation: ETT inserted through vocal cords under direct vision, positive ETCO2 and breath sounds checked- equal and bilateral Secured at: 21 cm Tube secured with: Tape Dental Injury: Teeth and Oropharynx as per pre-operative assessment

## 2021-09-28 NOTE — Anesthesia Procedure Notes (Signed)
Central Venous Catheter Insertion Performed by: Roderic Palau, MD, anesthesiologist Start/End6/09/2021 3:50 PM, 09/28/2021 4:05 PM Patient location: Pre-op. Preanesthetic checklist: patient identified, IV checked, site marked, risks and benefits discussed, surgical consent, monitors and equipment checked, pre-op evaluation, timeout performed and anesthesia consent Position: Trendelenburg Lidocaine 1% used for infiltration and patient sedated Hand hygiene performed , maximum sterile barriers used  and Seldinger technique used Catheter size: 8 Fr Total catheter length 16. Central line was placed.Double lumen Procedure performed using ultrasound guided technique. Ultrasound Notes:anatomy identified, needle tip was noted to be adjacent to the nerve/plexus identified, no ultrasound evidence of intravascular and/or intraneural injection and image(s) printed for medical record Attempts: 1 Following insertion, dressing applied, line sutured and Biopatch. Post procedure assessment: blood return through all ports  Patient tolerated the procedure well with no immediate complications.

## 2021-09-28 NOTE — H&P (Addendum)
Cardiology H&P:   Patient ID: Luis [name] MRN: [id number]; DOB: 1956-03-03  Admit date: 09/28/2021 Date of Consult: 09/28/2021  PCP:  Laurey Morale, MD   [facility] Providers Cardiologist:  None  Advanced Heart Failure:  [name], MD  Sleep Medicine:  Fransico Him, MD  `  Patient Profile:   Luis [name] is a 66 y.o. male with a hx of CAD s/p stenting to distal RCA, hypertension, hyperlipidemia, HFpEF, aortic stenosis, bladder cancer who is being seen 09/28/2021 for the evaluation of large pericardial effusion at the request of Dr. Dina Rich.   History of Present Illness:   Luis [name] is a 66 year old male with past medical history noted above.  He has been followed recently in the advanced heart failure clinic.  Had a Lexiscan Myoview in 2012 with inferior infarct and peri-infarct ischemia with PCI to the mid and distal RCA with DES x2.  He had a Lexiscan Cardiolite in 2016 which showed no ischemia or infarct.  Echocardiogram showed questionable bicuspid aortic valve with moderate aortic stenosis.  Did undergo right and left heart catheterization which showed mildly elevated left and right heart filling pressures and left heart cath showed nonobstructive CAD.  TEE confirmed bicuspid aortic valve with moderate aortic stenosis.   Echo 12/2016 showed LVEF of 60 to 65% with bicuspid aortic valve and moderate aortic stenosis.  CT chest showed 4.5 cm ascending aorta.  Repeat CT chest 11/2017 showed stable 4.5 cm ascending aorta.   He has been treated for bladder cancer with chemotherapy and a TURBT.  CTA chest 11/2020 with 4.4 cm ascending aorta.  Echocardiogram 06/2021 showed LVEF of 60 to 65%, mild LVH, normal RV, bicuspid aortic valve with severe aortic stenosis and mean gradient of 48 mmHg aortic valve area 0.91.  Given he was symptomatic with exertional dyspnea and increased weight gain medications were adjusted and he was sent for surgical evaluation of AVR.   Underwent cardiac  catheterization 07/08/2021 which showed patent stents in the RCA with no other significant disease.  He was referred to [facility] and seen by Dr. Cyndia Bent with recommendations to undergo aortic valve replacement with replacement of his ascending aorta.  Underwent surgical valve replacement as well as aortic graft on 09/16/2021.  Developed postoperative atrial fibrillation with RVR which was treated with IV amiodarone.  They were unable to initially start beta-blocker therapy due to underlying hypotension.  Coumadin was initiated in the setting of stroke risk reduction.  He was anemic with a hemoglobin of 7.1 and transfused 2 units of PRBCs with improvement to 7.4.  Was treated with IV Lasix for volume overload.  He was seen by cardiology in regards to his atrial fibrillation with recommendation of addition of digoxin.  Converted to sinus rhythm.  He was not continued on Coumadin at the time of discharge due to increased bleeding risk as he had hematuria with his history of bladder cancer.  His blood pressures did improve and he was restarted on Coreg at reduced dose.  Presented to the ED on 6/6 with an episode of syncope.  Per EMS report patient was found in the hallway by his family after hearing him fall.  On their arrival respirations were around 4 to 6 breaths/min and a NPA was placed.  Gradually became more alert and responsive and was answering questions upon arrival to the ED.  Labs in the ED showed sodium 135, potassium 4.1, creatinine 1.35, AST 69, ALT 92, BNP 171, high-sensitivity troponin 46>> 43, WBC  15.5, hemoglobin 8.  EKG showed sinus rhythm, 66 bpm, prolonged QT interval.  CT head was negative.  CT angio chest showed large low-density pericardial effusion measuring nearly 4 cm in thickness.  EDP reached out to cardiothoracic surgery with recommendations for stat echocardiogram.  Cardiology asked to evaluate as well.   In the ED he appears comfortable, blood pressures 774 systolic and HR in the 12I. Echo  just completed at the bedside.   Past Medical History:  Diagnosis Date   Anxiety    takes Valium as needed   Aortic stenosis, moderate    Arthritis    Ascending aortic aneurysm (HCC)    CAD (coronary artery disease)    a. s/p PCI of the RCA 8/12 with DES by Dr Burt Knack, preserved EF. b. LHC/RHC (2/16) with mean RA 12, PA 32/15, mean PCWP 18, CI 3.47; patent mid and distal RCA stents, 50-60% proximal stenosis small PDA.      Carotid stenosis    a. Carotid US (05/2013):  Bilateral 1-39% ICA; L thyroid nodule (prior hx of aspiration).   Chronic diastolic CHF (congestive heart failure) (HCC)    Complication of anesthesia    difficulty waking up after gallbladder surgery   Depression    Essential hypertension    GERD (gastroesophageal reflux disease)    if needed will take OTC meds    Heart murmur    History of colonic polyps    hyperplastic   Hyperlipidemia    Joint pain    Lesion of bladder    Myocardial infarction ([place]) 2012   Obesity (BMI 30-39.9) 02/29/2016   Restless leg    Sleep apnea    uses cpap   Tubular adenoma of colon    Vertigo    takes Meclizine as needed    Past Surgical History:  Procedure Laterality Date   AORTIC VALVE REPLACEMENT N/A 09/16/2021   Procedure: AORTIC VALVE REPLACEMENT (AVR);  Surgeon: Gaye Pollack, MD;  Location: [place];  Service: Open Heart Surgery;  Laterality: N/A;   CATARACT EXTRACTION   4 YRS AGO   BOTH EYES   CHOLECYSTECTOMY  07/21/2011   Procedure: LAPAROSCOPIC CHOLECYSTECTOMY WITH INTRAOPERATIVE CHOLANGIOGRAM;  Surgeon: Shann Medal, MD;  Location: [facility];  Service: General;  Laterality: N/A;   CORONARY ANGIOPLASTY  2012   2 stents   coronary stenting     s/p PCI of the RCA by Dr Burt Knack 8/12 with 2 promus stents   CYSTOSCOPY W/ RETROGRADES Bilateral 12/13/2019   Procedure: CYSTOSCOPY WITH RETROGRADE PYELOGRAM;  Surgeon: Alexis Frock, MD;  Location[facility];  Service: Urology;  Laterality: Bilateral;   CYSTOSCOPY  W/ RETROGRADES Bilateral 10/14/2020   Procedure: CYSTOSCOPY WITH RETROGRADE PYELOGRAM;  Surgeon: Alexis Frock, MD;  Location: [facility];  Service: Urology;  Laterality: Bilateral;   CYSTOSCOPY W/ RETROGRADES Bilateral 12/16/2020   Procedure: CYSTOSCOPY WITH RETROGRADE PYELOGRAM;  Surgeon: Alexis Frock, MD;  Location[facility];  Service: Urology;  Laterality: Bilateral;   LEFT AND RIGHT HEART CATHETERIZATION WITH CORONARY ANGIOGRAM N/A 06/23/2014   Procedure: LEFT AND RIGHT HEART CATHETERIZATION WITH CORONARY ANGIOGRAM;  Surgeon: Larey Dresser, MD;  Location: [facility] CATH LAB;  Service: Cardiovascular;  Laterality: N/A;   NECK SURGERY  03/23/09   per Dr. Lorin Mercy, cervical fusion    REPLACEMENT ASCENDING AORTA N/A 09/16/2021   Procedure: REPLACEMENT ASCENDING AORTA WITH 30 X 10MM HEMASHIELD PLATINUM WOVEN DOUBLE VELOUR VASCULAR GRAFT;  Surgeon: Gaye Pollack, MD;  Location: [facility];  Service: Open Heart Surgery;  Laterality: N/A;  CIRC ARREST   right elbow surgery     RIGHT HEART CATH AND CORONARY ANGIOGRAPHY N/A 07/08/2021   Procedure: RIGHT HEART CATH AND CORONARY ANGIOGRAPHY;  Surgeon: Larey Dresser, MD;  Location: [place] CV LAB;  Service: Cardiovascular;  Laterality: N/A;   solonscopy  05/23/08   per Dr. Wynona Luna hemorrhoids only, repeat in 5 years   TEE WITHOUT CARDIOVERSION N/A 06/23/2014   Procedure: TRANSESOPHAGEAL ECHOCARDIOGRAM (TEE);  Surgeon: Larey Dresser, MD;  Location: [place];  Service: Cardiovascular;  Laterality: N/A;   TEE WITHOUT CARDIOVERSION N/A 01/21/2016   Procedure: TRANSESOPHAGEAL ECHOCARDIOGRAM (TEE);  Surgeon: Larey Dresser, MD;  Location: [place];  Service: Cardiovascular;  Laterality: N/A;   TEE WITHOUT CARDIOVERSION N/A 09/16/2021   Procedure: TRANSESOPHAGEAL ECHOCARDIOGRAM (TEE);  Surgeon: Gaye Pollack, MD;  Location: [place];  Service: Open Heart Surgery;  Laterality: N/A;   TONSILLECTOMY      TRANSURETHRAL RESECTION OF BLADDER TUMOR N/A 10/21/2019   Procedure: TRANSURETHRAL RESECTION OF BLADDER TUMOR (TURBT);  Surgeon: Kathie Rhodes, MD;  Location[facility];  Service: Urology;  Laterality: N/A;   TRANSURETHRAL RESECTION OF BLADDER TUMOR N/A 12/13/2019   Procedure: TRANSURETHRAL RESECTION OF BLADDER TUMOR (TURBT);  Surgeon: Alexis Frock, MD;  Location: [facility];  Service: Urology;  Laterality: N/A;  1 HR   TRANSURETHRAL RESECTION OF BLADDER TUMOR N/A 10/14/2020   Procedure: TRANSURETHRAL RESECTION OF BLADDER TUMOR (TURBT);  Surgeon: Alexis Frock, MD;  Location: [facility];  Service: Urology;  Laterality: N/A;   TRANSURETHRAL RESECTION OF BLADDER TUMOR N/A 12/16/2020   Procedure: RESTAGING TRANSURETHRAL RESECTION OF BLADDER TUMOR (TURBT);  Surgeon: Alexis Frock, MD;  Location: [facility];  Service: Urology;  Laterality: N/A;     Home Medications:  Prior to Admission medications   Medication Sig Start Date End Date Taking? Authorizing Provider  acetaminophen (TYLENOL) 500 MG tablet Take 1-2 tablets (500-1,000 mg total) by mouth every 6 (six) hours as needed for moderate pain. 09/23/21   Barrett, Lodema Hong, PA-C  amiodarone (PACERONE) 200 MG tablet Take 2 tablets (400 mg total) by mouth 2 (two) times daily. X 7 days, then decrease to 200 mg BID x 7 days, then decrease to 200 mg daily 09/23/21   Barrett, Lodema Hong, PA-C  aspirin 81 MG tablet Take 81 mg by mouth every morning.    [provider]  carvedilol (COREG) 3.125 MG tablet Take 1 tablet (3.125 mg total) by mouth 2 (two) times daily with a meal. 09/23/21   Barrett, Erin R, PA-C  digoxin (LANOXIN) 0.25 MG tablet Take 1 tablet (0.25 mg total) by mouth daily. 09/23/21   Barrett, Erin R, PA-C  furosemide (LASIX) 40 MG tablet TAKE 1 TABLET (40 MG TOTAL) BY MOUTH DAILY AND 1/2 TABLETS (20 MG TOTAL) EVERY EVENING. Patient taking differently: Take 20-40 mg by mouth See admin  instructions. TAKE 1 TABLET (40 MG TOTAL) BY MOUTH DAILY AND 1/2 TABLETS (20 MG TOTAL) EVERY EVENING. 07/14/21   Larey Dresser, MD  ipratropium (ATROVENT) 0.03 % nasal spray Place 2 sprays into both nostrils every 12 (twelve) hours as needed for rhinitis.    [provider]  MAGNESIUM PO Take 500 mg by mouth daily.    [provider]  meclizine (ANTIVERT) 25 MG tablet Take 1 tablet (25 mg total) by mouth 3 (three) times daily as needed for dizziness. 10/21/16  Laurey Morale, MD  multivitamin-iron-minerals-folic acid (CENTRUM) chewable tablet Chew 1 tablet by mouth daily. 09/23/21   Barrett, Erin R, PA-C  nitroGLYCERIN (NITROSTAT) 0.4 MG SL tablet Place 1 tablet (0.4 mg total) under the tongue every 5 (five) minutes as needed. For chest pain. 11/03/17 12/05/25  Larey Dresser, MD  potassium chloride SA (KLOR-CON M) 20 MEQ tablet Take 2 tablets (40 mEq total) by mouth daily. 09/23/21   Barrett, Erin R, PA-C  rosuvastatin (CRESTOR) 20 MG tablet TAKE 1 TABLET BY MOUTH EVERYDAY AT BEDTIME Patient taking differently: Take 20 mg by mouth daily. 06/15/21   Larey Dresser, MD  tamsulosin (FLOMAX) 0.4 MG CAPS capsule Take 0.4 mg by mouth daily.    [provider]  traMADol (ULTRAM) 50 MG tablet Take 1-2 tablets (50-100 mg total) by mouth every 4 (four) hours as needed for moderate pain. 09/23/21   Barrett, Erin R, PA-C  venlafaxine XR (EFFEXOR-XR) 150 MG 24 hr capsule TAKE 1 CAPSULE BY MOUTH DAILY WITH BREAKFAST. Patient taking differently: Take 150 mg by mouth daily with breakfast. 08/16/21   Laurey Morale, MD    Inpatient Medications: Scheduled Meds:  Continuous Infusions:  PRN Meds:   Allergies:    Allergies  Allergen Reactions   Oxycodone     insomnia    Social History:   Social History   Socioeconomic History   Marital status: Married    Spouse name: Not on file   Number of children: 4   Years of education: Not on file   Highest education level: Not on file   Occupational History   Occupation: Designer, multimedia: Programmer, systems  Tobacco Use   Smoking status: Former    Packs/day: 2.00    Years: 40.00    Pack years: 80.00    Types: Cigarettes    Quit date: 2012    Years since quitting: 11.4   Smokeless tobacco: Never  Vaping Use   Vaping Use: Never used  Substance and Sexual Activity   Alcohol use: No    Alcohol/week: 0.0 standard drinks   Drug use: No   Sexual activity: Not on file  Other Topics Concern   Not on file  Social History Narrative   Not on file   Social Determinants of Health   Financial Resource Strain: Low Risk    Difficulty of Paying Living Expenses: Not hard at all  Food Insecurity: No Food Insecurity   Worried About Charity fundraiser in the Last Year: Never true   [place] in the Last Year: Never true  Transportation Needs: No Transportation Needs   Lack of Transportation (Medical): No   Lack of Transportation (Non-Medical): No  Physical Activity: Insufficiently Active   Days of Exercise per Week: 3 days   Minutes of Exercise per Session: 30 min  Stress: No Stress Concern Present   Feeling of Stress : Not at all  Social Connections: Moderately Isolated   Frequency of Communication with Friends and Family: More than three times a week   Frequency of Social Gatherings with Friends and Family: More than three times a week   Attends Religious Services: Never   Marine scientist or Organizations: No   Attends Archivist Meetings: Never   Marital Status: Married  Human resources officer Violence: Not At Risk   Fear of Current or Ex-Partner: No   Emotionally Abused: No   Physically Abused: No   Sexually  Abused: No    Family History:    Family History  Problem Relation Age of Onset   Lung cancer Mother        lung   Esophageal cancer Cousin    Colon cancer Neg Hx    Rectal cancer Neg Hx    Stomach cancer Neg Hx      ROS:  Please see the history of present  illness.   All other ROS reviewed and negative.     Physical Exam/Data:   Vitals:   09/28/21 0430 09/28/21 0500 09/28/21 0545 09/28/21 0600  BP: 123/66 103/61 110/87 (!) 109/48  Pulse: (!) 58 (!) 58 (!) 57 (!) 59  Resp: '13 20  16  '$ Temp:      TempSrc:      SpO2: 96% 98% 99% 94%   No intake or output data in the 24 hours ending 09/28/21 0821    09/23/2021    5:57 AM 09/22/2021    4:45 AM 09/21/2021    3:40 AM  Last 3 Weights  Weight (lbs) 277 lb 14.4 oz 282 lb 13.6 oz 289 lb 3.9 oz  Weight (kg) 126.055 kg 128.3 kg 131.2 kg     There is no height or weight on file to calculate BMI.  General:  Obese male, laying back in bed appearing comfortable.  HEENT: normal Neck: no JVD Vascular: No carotid bruits; Distal pulses 2+ bilaterally Cardiac:  normal S1, S2; RRR; soft systolic murmur  Lungs:  clear to auscultation bilaterally, no wheezing, rhonchi or rales  Abd: soft, nontender, no hepatomegaly  Ext: 1+ LE edema bilaterally Musculoskeletal:  No deformities, BUE and BLE strength normal and equal Skin: warm and dry  Neuro:  CNs 2-12 intact, no focal abnormalities noted Psych:  Normal affect   EKG:  The EKG was personally reviewed and demonstrates:  sinus rhythm, 66 bpm, prolonged QT interval Telemetry:  Telemetry was personally reviewed and demonstrates:  Sinus Rhythm 60s  Relevant CV Studies:  Echo: pending official read  Laboratory Data:  High Sensitivity Troponin:   Recent Labs  Lab 09/28/21 0228 09/28/21 0446  TROPONINIHS 46* 43*     Chemistry Recent Labs  Lab 09/22/21 0530 09/23/21 0530 09/28/21 0228  NA 135 136 135  K 3.2* 3.4* 4.1  CL 95* 93* 100  CO2 30 33* 23  GLUCOSE 116* 116* 179*  BUN 26* 24* 22  CREATININE 1.16 1.11 1.35*  CALCIUM 8.1* 8.3* 8.1*  GFRNONAA >60 >60 58*  ANIONGAP '10 10 12    '$ Recent Labs  Lab 09/28/21 0228  PROT 6.2*  ALBUMIN 2.8*  AST 69*  ALT 92*  ALKPHOS 124  BILITOT 1.0   Lipids No results for input(s): CHOL, TRIG,  HDL, LABVLDL, LDLCALC, CHOLHDL in the last 168 hours.  Hematology Recent Labs  Lab 09/22/21 0530 09/28/21 0228  WBC 9.2 15.5*  RBC 2.63* 2.74*  HGB 7.8* 8.0*  HCT 24.0* 26.1*  MCV 91.3 95.3  MCH 29.7 29.2  MCHC 32.5 30.7  RDW 15.6* 14.8  PLT 195 340   Thyroid No results for input(s): TSH, FREET4 in the last 168 hours.  BNP Recent Labs  Lab 09/28/21 0228  BNP 171.0*    DDimer No results for input(s): DDIMER in the last 168 hours.   Radiology/Studies:  CT Head Wo Contrast  Result Date: 09/28/2021 CLINICAL DATA:  Head trauma EXAM: CT HEAD WITHOUT CONTRAST TECHNIQUE: Contiguous axial images were obtained from the base of the skull through the vertex without  intravenous contrast. RADIATION DOSE REDUCTION: This exam was performed according to the departmental dose-optimization program which includes automated exposure control, adjustment of the mA and/or kV according to patient size and/or use of iterative reconstruction technique. COMPARISON:  None Available. FINDINGS: Brain: There is no mass, hemorrhage or extra-axial collection. The size and configuration of the ventricles and extra-axial CSF spaces are normal. The brain parenchyma is normal, without acute or chronic infarction. Vascular: No abnormal hyperdensity of the major intracranial arteries or dural venous sinuses. No intracranial atherosclerosis. Skull: The visualized skull base, calvarium and extracranial soft tissues are normal. Sinuses/Orbits: No fluid levels or advanced mucosal thickening of the visualized paranasal sinuses. No mastoid or middle ear effusion. The orbits are normal. IMPRESSION: Normal head CT. Electronically Signed   By: Ulyses Jarred M.D.   On: 09/28/2021 04:07   DG Chest Portable 1 View  Result Date: 09/28/2021 CLINICAL DATA:  Syncope. EXAM: PORTABLE CHEST 1 VIEW COMPARISON:  Sep 20, 2021 FINDINGS: Multiple sternal wires are noted. The right-sided PICC line seen on the prior exam has been removed. There is no  evidence of focal consolidation, pleural effusion or pneumothorax. The cardiac silhouette is markedly enlarged and unchanged in size. An artificial aortic valve is seen. A radiopaque fusion plate and screws are seen overlying the cervical spine. No acute osseous abnormalities are identified. IMPRESSION: Stable cardiomegaly without evidence of acute or active cardiopulmonary disease. Electronically Signed   By: Virgina Norfolk M.D.   On: 09/28/2021 02:59   CT Angio Chest/Abd/Pel for Dissection W and/or Wo Contrast  Result Date: 09/28/2021 CLINICAL DATA:  Found unresponsive in hallway at home. EXAM: CT ANGIOGRAPHY CHEST, ABDOMEN AND PELVIS TECHNIQUE: Non-contrast CT of the chest was initially obtained. Multidetector CT imaging through the chest, abdomen and pelvis was performed using the standard protocol during bolus administration of intravenous contrast. Multiplanar reconstructed images and MIPs were obtained and reviewed to evaluate the vascular anatomy. RADIATION DOSE REDUCTION: This exam was performed according to the departmental dose-optimization program which includes automated exposure control, adjustment of the mA and/or kV according to patient size and/or use of iterative reconstruction technique. CONTRAST:  137m OMNIPAQUE IOHEXOL 350 MG/ML SOLN COMPARISON:  07/15/2021 FINDINGS: CTA CHEST FINDINGS Cardiovascular: Interval aortic valve/root repair. Large low-density pericardial effusion which is simple fluid density. The effusion measures up to 3.8 cm in thickness along the right heart border. No evidence of aortic dissection. No noted pulmonary emboli. Mediastinum/Nodes: No high-density hematoma or rim enhancing collection. Lungs/Pleura: Small pleural effusions. No pulmonary edema or pneumonia. Musculoskeletal: Sternotomy wires are intact with unremarkable osteotomy margins. No acute finding Review of the MIP images confirms the above findings. CTA ABDOMEN AND PELVIS FINDINGS VASCULAR Aorta:  Atheromatous plaque.  No aneurysm or dissection Celiac: Separate origin of the celiac and hepatic arteries. No acute finding SMA: No acute finding or proximal flow limiting stenosis Renals: Unremarkable IMA: Patent Inflow: Atheromatous plaque without stenosis or aneurysm Veins: Unremarkable Review of the MIP images confirms the above findings. NON-VASCULAR Hepatobiliary: Hepatic steatosis based on the noncontrast phase. Cholecystectomy. Pancreas: No acute finding Spleen: Negative Adrenals/Urinary Tract: Simple left renal cyst at the lower pole. No follow-up required. No hydronephrosis. Negative adrenals. Partially distended bladder which shows left upper eccentric thickening with urothelial based calcific density. Stomach/Bowel: No acute finding Lymphatic: Negative for adenopathy Reproductive: No acute finding Other: No ascites or pneumoperitoneum.  Fatty umbilical hernia. Musculoskeletal: Generalized lumbar spine degeneration Review of the MIP images confirms the above findings. IMPRESSION: 1. Large low-density pericardial  effusion measuring nearly 4 cm in thickness. 2. Interval aortic valve and root repair. No acute vascular finding. 3. Eccentric thickening of the bladder wall with probable urothelial calcification on the left, recommend outpatient urology referral. 4. Hepatic steatosis. Electronically Signed   By: Jorje Guild M.D.   On: 09/28/2021 05:34     Assessment and Plan:   Luis [name] is a 66 y.o. male with a hx of CAD s/p stenting to distal RCA, hypertension, hyperlipidemia, HFpEF, aortic stenosis, bladder cancer who is being seen 09/28/2021 for the evaluation of large pericardial effusion at the request of Dr. Dina Rich.   Syncope/Pericardial effusion: had unwitnessed syncopal episode in the hallway, family heard him fall. CT angio with large pericardial effusion 4cm. Ordered for STAT echo, shows evidence of tamponade physiology. Plan for pericardiocentesis. HR and BP are stable. TCTS called  by EDP as well, recommended cardiology admission for pericardiocentesis  Aortic stenosis s/p AVR, ascending aorta graft: done 5/25 by Dr. Cyndia Bent. Post op course overall uncomplicated  --Normal functioning valve on echo  Post op Afib: in SR on admission  -- continue amiodarone taper with '400mg'$  BID, then drop to '200mg'$  BID tomorrow -- hold dig as he remains in SR -- no OAC with pericardial effusion/hematuria noted during recent admission  CAD s/p stenting of RCA '12: no anginal symptoms -- continue ASA, statin. Holding BB for now with pericardial effusion  HFpEF: post volume overload has improved per pt and family -- will hold on lasix for now  Hx of bladder Ca: did have hematuria during recent admission. There was concern his cancer may have reoccurred. Now with pericardial effusion, question there this is contributing vs mostly post procedural issue. Will need cytologies of pericardial fluid   Risk Assessment/Risk Scores:   CHA2DS2-VASc Score = 4  This indicates a 4.8% annual risk of stroke. The patient's score is based upon: CHF History: 1 HTN History: 1 Diabetes History: 0 Stroke History: 0 Vascular Disease History: 1 Age Score: 1 Gender Score: 0   For questions or updates, please contact [facility] Please consult [email or web address] for contact info under    Signed, Reino Bellis, NP  09/28/2021 8:21 AM  Patient seen and examined.  Agree with above documentation.  Luis [name] is a 66 year old male with history of CAD status post RCA PCI, bicuspid aortic valve with severe aortic stenosis status post AVR 09/16/2021, ascending aorta replacement 09/16/2021, bladder cancer who presents with syncopal episode.  Echo 3/23 showed severe aortic stenosis.  Cath 06/2021 showed patent RCA stents, no other significant disease.  Referred to [facility] and underwent aortic valve replacement and aortic root replacement on 09/16/2021.  Postop course complicated by A-fib with RVR.  Was not started on  anticoagulation given his anemia, as he had issues with hematuria.  He converted to sinus rhythm with amiodarone, was also discharged on digoxin.  He is admitted with syncope.  Reports that last night he got up during the night to urinate, and after using the bathroom he was washing his hands at the sink and then felt short of breath. Wife heard him fall to the ground and called EMS.  On arrival to the ED, vitals have been stable.  CT chest showed large pericardial effusion, no aortic dissection.  Echocardiogram showed large effusion measuring 3 cm around RV, signs of tamponade physiology with RV diastolic collapse.  He was seen by Dr. Darcey Nora in cardiac surgery, recommended cardiology admission for pericardiocentesis.  On exam, patient is alert and oriented,  regular rate and rhythm, no murmurs, lungs CTAB, trace lower extremity edema.  Given large effusion with signs of tamponade physiology on echo and presenting with syncope, recommend pericardiocentesis.  Discussed with Dr. Irish Lack, patient is scheduled for next case this morning.  Discussed risks and benefits with patient and he is willing to proceed.  Donato Heinz, MD

## 2021-09-28 NOTE — Anesthesia Preprocedure Evaluation (Addendum)
Anesthesia Evaluation  Patient identified by MRN, date of birth, ID band Patient awake    Reviewed: Allergy & Precautions, H&P , NPO status , Patient's Chart, lab work & pertinent test results, reviewed documented beta blocker date and time   Airway Mallampati: II  TM Distance: >3 FB Neck ROM: Full    Dental no notable dental hx. (+) Partial Upper, Partial Lower, Dental Advisory Given   Pulmonary sleep apnea , former smoker,    Pulmonary exam normal breath sounds clear to auscultation       Cardiovascular hypertension, Pt. on medications and Pt. on home beta blockers + CAD, + Past MI and +CHF   Rhythm:Regular Rate:Normal     Neuro/Psych Anxiety Depression negative neurological ROS     GI/Hepatic Neg liver ROS, GERD  ,  Endo/Other  Morbid obesity  Renal/GU negative Renal ROS  negative genitourinary   Musculoskeletal  (+) Arthritis , Osteoarthritis,    Abdominal   Peds  Hematology negative hematology ROS (+)   Anesthesia Other Findings   Reproductive/Obstetrics negative OB ROS                            Anesthesia Physical Anesthesia Plan  ASA: 4  Anesthesia Plan: General   Post-op Pain Management:    Induction: Intravenous  PONV Risk Score and Plan: 3 and Ondansetron, Dexamethasone and Midazolam  Airway Management Planned: Oral ETT and Video Laryngoscope Planned  Additional Equipment: Arterial line  Intra-op Plan:   Post-operative Plan: Extubation in OR  Informed Consent: I have reviewed the patients History and Physical, chart, labs and discussed the procedure including the risks, benefits and alternatives for the proposed anesthesia with the patient or authorized representative who has indicated his/her understanding and acceptance.     Dental advisory given  Plan Discussed with: CRNA  Anesthesia Plan Comments:         Anesthesia Quick Evaluation

## 2021-09-28 NOTE — Progress Notes (Signed)
  Subjective: Patient examined, images of chest CT scan and this morning's echocardiogram personally reviewed and discussed with patient. Syncopal episode early this a.m. while he was up using the bathroom preceded by some shortness of breath with exertion. Discharged 5 days ago after AVR and replacement of the ascending aorta with straight graft. Echo shows moderate pericardial effusion with maximal diameter of 3 cm over the right ventricle with some compression.  LV function and aortic valve function are normal.  No pulmonary edema.  Renal function is baseline normal.  He is not tachycardic and has stable blood pressure.  Situation discussed with Dr. Cyndia Bent.  Patient needs cardiology admission and evaluation for pericardiocentesis ,if safe.  If not the patient could have a subxiphoid pericardial window.  The patient is not anticoagulated.  He is currently n.p.o. waiting for room in the ED.  Objective: Vital signs in last 24 hours: Temp:  [97 F (36.1 C)] 97 F (36.1 C) (06/06 0303) Pulse Rate:  [57-65] 59 (06/06 0600) Cardiac Rhythm: Normal sinus rhythm (06/06 0230) Resp:  [11-20] 16 (06/06 0600) BP: (103-130)/(48-87) 109/48 (06/06 0600) SpO2:  [93 %-99 %] 94 % (06/06 0600)  Hemodynamic parameters for last 24 hours:    Intake/Output from previous day: No intake/output data recorded. Intake/Output this shift: No intake/output data recorded.  Exam  Alert and comfortable in the ED No JVD Lungs clear Heart rate regular without murmur or pericardial rub Extremities warm without edema Neuro intact  Lab Results: Recent Labs    09/28/21 0228  WBC 15.5*  HGB 8.0*  HCT 26.1*  PLT 340   BMET:  Recent Labs    09/28/21 0228  NA 135  K 4.1  CL 100  CO2 23  GLUCOSE 179*  BUN 22  CREATININE 1.35*  CALCIUM 8.1*    PT/INR: No results for input(s): LABPROT, INR in the last 72 hours. ABG    Component Value Date/Time   PHART 7.351 09/16/2021 2204   HCO3 23.8 09/16/2021  2204   TCO2 25 09/16/2021 2204   ACIDBASEDEF 2.0 09/16/2021 2204   O2SAT 98 09/16/2021 2204   CBG (last 3)  No results for input(s): GLUCAP in the last 72 hours.  Assessment/Plan: S/P   AVR and replacement of ascending aortic aneurysm by Dr. Cyndia Bent Moderate pericardial effusion with probable pretamponade Recommend admission to cardiology and assessment for pericardiocentesis in Cath Lab under imaging.    LOS: 0 days    Dahlia Byes 09/28/2021

## 2021-09-28 NOTE — Anesthesia Procedure Notes (Signed)
Arterial Line Insertion Start/End6/09/2021 2:30 PM, 09/28/2021 2:35 PM Performed by: CRNA  Patient location: Pre-op. Preanesthetic checklist: patient identified, IV checked, site marked, risks and benefits discussed, surgical consent, monitors and equipment checked, pre-op evaluation, timeout performed and anesthesia consent Lidocaine 1% used for infiltration Right, radial was placed Catheter size: 20 G Hand hygiene performed  and maximum sterile barriers used   Attempts: 1 Procedure performed without using ultrasound guided technique. Following insertion, dressing applied and Biopatch. Post procedure assessment: normal and unchanged

## 2021-09-28 NOTE — Progress Notes (Signed)
RT attempted to retrieve abg from pt. No sample was obtainable. Pt. Also did not tolerate the procedure and pt.wife could not bare the site of watching pt. Get stuck with needle. Pt. Wife asked RT to stop attempting to get the abg. RT made Dr. Dina Rich aware. No issues at time, pt. Remains on 2L nasal cannula with sats at 97%.

## 2021-09-28 NOTE — Op Note (Signed)
  CARDIOVASCULAR SURGERY OPERATIVE NOTE  09/28/2021  Surgeon:  Gaye Pollack, MD  First Assistant: Jadene Pierini, PA-C   Preoperative Diagnosis:  Large pericardial effusion  Postoperative Diagnosis:  Same  Procedure:  Subxyphoid pericardial window   Anesthesia:  General Endotracheal   Clinical History/Surgical Indication:  The patient is s/p AVR and supra-coronary replacement of the ascending aorta for aortic aneurysm on 09/16/2021. He was discharged on 09/23/2021 and now returned this am with progressive SOB, fatigue, nausea and abdominal discomfort and presyncope. CTA of the chest showed a large circumferential pericardial effusion and echo showed the effusion was largest over the RV with signs of tamponade. He had an attempt at catheter drainage by Dr. Irish Lack in the cath lab but the fluid could not be accessed due to body habitus. I felt subxyphoid pericardial window was needed. I discussed the operative procedure with the patient and family including alternatives, benefits and risks; including but not limited to bleeding, blood transfusion, infection, organ dysfunction, and death.  Luis [name] understands and agrees to proceed.   Preparation:  The patient was seen in the preoperative holding area and the correct patient, correct operation were confirmed with the patient after reviewing the medical record and catheterization. The consent was signed by me. Preoperative antibiotics were given.  The patient was taken back to the operating room and positioned supine on the operating room table. After being placed under general endotracheal anesthesia by the anesthesia team a foley catheter was placed. The neck, chest, and abdomen were prepped with betadine soap and solution and draped in the usual sterile manner. A surgical time-out was taken and the correct patient and operative procedure were confirmed with the nursing and anesthesia staff.   TEE:  Performed by Dr. Suann Larry.  This showed a large circumferential pericardial effusion.   Subxyphoid pericardial window:  A 4 cm incision was made over the xyphoid process and carried down through the subcutaneous tissue using cautery until the midline fascia was encountered. The fascia was divided in the midline and the subxyphoid space entered. The anterior pericardial space was entered bluntly with a careful finger under the sternum. The pericardial space was entered and about 850 cc of old bloody fluid was removed. TEE confirmed that all of the fluid was removed. A 32 F Blake drain was placed in the inferior pericardial space through a separate small incision. Hemostasis was complete. The midline fascia was approximated with interrupted #0 vicryl sutures. The subcutaneous tissue was approximated with continuous 2-0 vicryl suture and the skin with 3-0 vicryl subcuticular suture. The sponge, needle, and instrument counts were correct according to the nurses. The patient was awakened, extubated, and transported to [facility] in stable condition.

## 2021-09-28 NOTE — Brief Op Note (Signed)
09/28/2021  5:18 PM  PATIENT:  Luis [name]  66 y.o. male  PRE-OPERATIVE DIAGNOSIS:  PERICARDIAL EFFUSION  POST-OPERATIVE DIAGNOSIS:  PERICARDIAL EFFUSION  PROCEDURE:  Procedure(s): SUBXYPHOID PERICARDIAL WINDOW (N/A) TRANSESOPHAGEAL ECHOCARDIOGRAM (TEE) (N/A)  SURGEON:  Surgeon(s) and Role:    * Sarya Linenberger, Fernande Boyden, MD - Primary  PHYSICIAN ASSISTANT: Jadene Pierini, PA-C   ANESTHESIA:   general  EBL:  10 mL   BLOOD ADMINISTERED:none  DRAINS: (36 F) Blake drain(s) in the pericardium       850 cc of old thin bloody fluid removed.  LOCAL MEDICATIONS USED:  NONE  SPECIMEN:  No Specimen  DISPOSITION OF SPECIMEN:  N/A  COUNTS:  YES  TOURNIQUET:  * No tourniquets in log *  DICTATION: .Note written in EPIC  PLAN OF CARE: Admit to inpatient   PATIENT DISPOSITION:  ICU - extubated and stable.   Delay start of Pharmacological VTE agent (>24hrs) due to surgical blood loss or risk of bleeding: yes

## 2021-09-28 NOTE — ED Provider Notes (Addendum)
[facility] EMERGENCY DEPARTMENT Provider Note   CSN: [id number] Arrival date & time: 09/28/21  0220     History  Chief Complaint  Patient presents with  . Loss of Consciousness    Luis [name] is a 66 y.o. male.  HPI     This is a 66 year old male with recent history of aortic valve replacement, and an endovascular aortic graft who presents with syncope.  Per EMS, patient was found in the hallway by his family.  They heard him fall.  Patient does not have any recollection of the events.  Upon EMS arrival, respirations were 4-6.  NPA was placed.  Patient gradually got more responsive and could answer questions.  Upon arrival to the emergency department, he is awake, alert, oriented.  He has no recollection of the events.  Denies any prodrome.  Denies any chest pain shortness of breath, fever, dizziness.  Denies pain.  He is not taking any pain medication at home.  Chart reviewed.  Recent surgery as stated above.  Was discharged on June 2.  He was discharged on digoxin, amiodarone, Lasix.  Patient was discontinued on Coumadin secondary to hematuria likely related to bladder cancer.  Home Medications Prior to Admission medications   Medication Sig Start Date End Date Taking? Authorizing Provider  acetaminophen (TYLENOL) 500 MG tablet Take 1-2 tablets (500-1,000 mg total) by mouth every 6 (six) hours as needed for moderate pain. 09/23/21   Barrett, Lodema Hong, PA-C  amiodarone (PACERONE) 200 MG tablet Take 2 tablets (400 mg total) by mouth 2 (two) times daily. X 7 days, then decrease to 200 mg BID x 7 days, then decrease to 200 mg daily 09/23/21   Barrett, Lodema Hong, PA-C  aspirin 81 MG tablet Take 81 mg by mouth every morning.    [provider]  carvedilol (COREG) 3.125 MG tablet Take 1 tablet (3.125 mg total) by mouth 2 (two) times daily with a meal. 09/23/21   Barrett, Erin R, PA-C  digoxin (LANOXIN) 0.25 MG tablet Take 1 tablet (0.25 mg total) by mouth daily. 09/23/21    Barrett, Erin R, PA-C  furosemide (LASIX) 40 MG tablet TAKE 1 TABLET (40 MG TOTAL) BY MOUTH DAILY AND 1/2 TABLETS (20 MG TOTAL) EVERY EVENING. Patient taking differently: Take 20-40 mg by mouth See admin instructions. TAKE 1 TABLET (40 MG TOTAL) BY MOUTH DAILY AND 1/2 TABLETS (20 MG TOTAL) EVERY EVENING. 07/14/21   Larey Dresser, MD  ipratropium (ATROVENT) 0.03 % nasal spray Place 2 sprays into both nostrils every 12 (twelve) hours as needed for rhinitis.    [provider]  MAGNESIUM PO Take 500 mg by mouth daily.    [provider]  meclizine (ANTIVERT) 25 MG tablet Take 1 tablet (25 mg total) by mouth 3 (three) times daily as needed for dizziness. 10/21/16   Laurey Morale, MD  multivitamin-iron-minerals-folic acid (CENTRUM) chewable tablet Chew 1 tablet by mouth daily. 09/23/21   Barrett, Erin R, PA-C  nitroGLYCERIN (NITROSTAT) 0.4 MG SL tablet Place 1 tablet (0.4 mg total) under the tongue every 5 (five) minutes as needed. For chest pain. 11/03/17 12/05/25  Larey Dresser, MD  potassium chloride SA (KLOR-CON M) 20 MEQ tablet Take 2 tablets (40 mEq total) by mouth daily. 09/23/21   Barrett, Erin R, PA-C  rosuvastatin (CRESTOR) 20 MG tablet TAKE 1 TABLET BY MOUTH EVERYDAY AT BEDTIME Patient taking differently: Take 20 mg by mouth daily. 06/15/21   Larey Dresser,  MD  tamsulosin (FLOMAX) 0.4 MG CAPS capsule Take 0.4 mg by mouth daily.    [provider]  traMADol (ULTRAM) 50 MG tablet Take 1-2 tablets (50-100 mg total) by mouth every 4 (four) hours as needed for moderate pain. 09/23/21   Barrett, Erin R, PA-C  venlafaxine XR (EFFEXOR-XR) 150 MG 24 hr capsule TAKE 1 CAPSULE BY MOUTH DAILY WITH BREAKFAST. Patient taking differently: Take 150 mg by mouth daily with breakfast. 08/16/21   Laurey Morale, MD      Allergies    Oxycodone    Review of Systems   Review of Systems  Constitutional:  Negative for fever.  Respiratory:  Negative for shortness of breath.    Cardiovascular:  Negative for chest pain.  Neurological:  Positive for syncope.  All other systems reviewed and are negative.  Physical Exam Updated Vital Signs BP (!) 109/48 (BP Location: Right Arm)   Pulse (!) 59   Temp (!) 97 F (36.1 C) (Temporal)   Resp 16   SpO2 94%  Physical Exam Vitals and nursing note reviewed.  Constitutional:      Appearance: He is well-developed. He is obese.     Comments: Ill-appearing but nontoxic  HENT:     Head: Normocephalic and atraumatic.     Mouth/Throat:     Mouth: Mucous membranes are moist.  Eyes:     Pupils: Pupils are equal, round, and reactive to light.     Comments: Pupils 4 mm reactive bilaterally  Cardiovascular:     Rate and Rhythm: Normal rate and regular rhythm.     Heart sounds: Normal heart sounds. No murmur heard.    Comments: Fresh scar over the sternum, clean dry and intact, no adjacent erythema, no drainage Pulmonary:     Effort: Pulmonary effort is normal. No respiratory distress.     Breath sounds: Normal breath sounds. No wheezing.  Abdominal:     Palpations: Abdomen is soft.     Tenderness: There is no abdominal tenderness. There is no rebound.     Comments: Protuberant but soft  Musculoskeletal:     Cervical back: Neck supple.     Comments: Trace bilateral lower extremity swelling  Lymphadenopathy:     Cervical: No cervical adenopathy.  Skin:    General: Skin is warm and dry.  Neurological:     Mental Status: He is alert and oriented to person, place, and time.  Psychiatric:        Mood and Affect: Mood normal.    ED Results / Procedures / Treatments   Labs (all labs ordered are listed, but only abnormal results are displayed) Labs Reviewed  CBC WITH DIFFERENTIAL/PLATELET - Abnormal; Notable for the following components:      Result Value   WBC 15.5 (*)    RBC 2.74 (*)    Hemoglobin 8.0 (*)    HCT 26.1 (*)    Neutro Abs 11.1 (*)    Monocytes Absolute 1.1 (*)    Abs Immature Granulocytes 0.32 (*)     All other components within normal limits  BASIC METABOLIC PANEL - Abnormal; Notable for the following components:   Glucose, Bld 179 (*)    Creatinine, Ser 1.35 (*)    Calcium 8.1 (*)    GFR, Estimated 58 (*)    All other components within normal limits  BRAIN NATRIURETIC PEPTIDE - Abnormal; Notable for the following components:   B Natriuretic Peptide 171.0 (*)    All other components within normal  limits  HEPATIC FUNCTION PANEL - Abnormal; Notable for the following components:   Total Protein 6.2 (*)    Albumin 2.8 (*)    AST 69 (*)    ALT 92 (*)    Bilirubin, Direct 0.3 (*)    All other components within normal limits  TROPONIN I (HIGH SENSITIVITY) - Abnormal; Notable for the following components:   Troponin I (High Sensitivity) 46 (*)    All other components within normal limits  TROPONIN I (HIGH SENSITIVITY) - Abnormal; Notable for the following components:   Troponin I (High Sensitivity) 43 (*)    All other components within normal limits  DIGOXIN LEVEL  LIPASE, BLOOD  URINALYSIS, ROUTINE W REFLEX MICROSCOPIC  I-STAT ARTERIAL BLOOD GAS, ED  TYPE AND SCREEN    EKG EKG Interpretation  Date/Time:  Tuesday September 28 2021 02:27:17 EDT Ventricular Rate:  66 PR Interval:  228 QRS Duration: 120 QT Interval:  460 QTC Calculation: 482 R Axis:   83 Text Interpretation: Sinus rhythm Prolonged PR interval Nonspecific intraventricular conduction delay Anterior infarct, old Confirmed by Thayer Jew [phone]) on 09/28/2021 2:35:07 AM  Radiology CT Head Wo Contrast  Result Date: 09/28/2021 CLINICAL DATA:  Head trauma EXAM: CT HEAD WITHOUT CONTRAST TECHNIQUE: Contiguous axial images were obtained from the base of the skull through the vertex without intravenous contrast. RADIATION DOSE REDUCTION: This exam was performed according to the departmental dose-optimization program which includes automated exposure control, adjustment of the mA and/or kV according to patient size and/or  use of iterative reconstruction technique. COMPARISON:  None Available. FINDINGS: Brain: There is no mass, hemorrhage or extra-axial collection. The size and configuration of the ventricles and extra-axial CSF spaces are normal. The brain parenchyma is normal, without acute or chronic infarction. Vascular: No abnormal hyperdensity of the major intracranial arteries or dural venous sinuses. No intracranial atherosclerosis. Skull: The visualized skull base, calvarium and extracranial soft tissues are normal. Sinuses/Orbits: No fluid levels or advanced mucosal thickening of the visualized paranasal sinuses. No mastoid or middle ear effusion. The orbits are normal. IMPRESSION: Normal head CT. Electronically Signed   By: Ulyses Jarred M.D.   On: 09/28/2021 04:07   DG Chest Portable 1 View  Result Date: 09/28/2021 CLINICAL DATA:  Syncope. EXAM: PORTABLE CHEST 1 VIEW COMPARISON:  Sep 20, 2021 FINDINGS: Multiple sternal wires are noted. The right-sided PICC line seen on the prior exam has been removed. There is no evidence of focal consolidation, pleural effusion or pneumothorax. The cardiac silhouette is markedly enlarged and unchanged in size. An artificial aortic valve is seen. A radiopaque fusion plate and screws are seen overlying the cervical spine. No acute osseous abnormalities are identified. IMPRESSION: Stable cardiomegaly without evidence of acute or active cardiopulmonary disease. Electronically Signed   By: Virgina Norfolk M.D.   On: 09/28/2021 02:59   CT Angio Chest/Abd/Pel for Dissection W and/or Wo Contrast  Result Date: 09/28/2021 CLINICAL DATA:  Found unresponsive in hallway at home. EXAM: CT ANGIOGRAPHY CHEST, ABDOMEN AND PELVIS TECHNIQUE: Non-contrast CT of the chest was initially obtained. Multidetector CT imaging through the chest, abdomen and pelvis was performed using the standard protocol during bolus administration of intravenous contrast. Multiplanar reconstructed images and MIPs were  obtained and reviewed to evaluate the vascular anatomy. RADIATION DOSE REDUCTION: This exam was performed according to the departmental dose-optimization program which includes automated exposure control, adjustment of the mA and/or kV according to patient size and/or use of iterative reconstruction technique. CONTRAST:  147m OMNIPAQUE IOHEXOL  350 MG/ML SOLN COMPARISON:  07/15/2021 FINDINGS: CTA CHEST FINDINGS Cardiovascular: Interval aortic valve/root repair. Large low-density pericardial effusion which is simple fluid density. The effusion measures up to 3.8 cm in thickness along the right heart border. No evidence of aortic dissection. No noted pulmonary emboli. Mediastinum/Nodes: No high-density hematoma or rim enhancing collection. Lungs/Pleura: Small pleural effusions. No pulmonary edema or pneumonia. Musculoskeletal: Sternotomy wires are intact with unremarkable osteotomy margins. No acute finding Review of the MIP images confirms the above findings. CTA ABDOMEN AND PELVIS FINDINGS VASCULAR Aorta: Atheromatous plaque.  No aneurysm or dissection Celiac: Separate origin of the celiac and hepatic arteries. No acute finding SMA: No acute finding or proximal flow limiting stenosis Renals: Unremarkable IMA: Patent Inflow: Atheromatous plaque without stenosis or aneurysm Veins: Unremarkable Review of the MIP images confirms the above findings. NON-VASCULAR Hepatobiliary: Hepatic steatosis based on the noncontrast phase. Cholecystectomy. Pancreas: No acute finding Spleen: Negative Adrenals/Urinary Tract: Simple left renal cyst at the lower pole. No follow-up required. No hydronephrosis. Negative adrenals. Partially distended bladder which shows left upper eccentric thickening with urothelial based calcific density. Stomach/Bowel: No acute finding Lymphatic: Negative for adenopathy Reproductive: No acute finding Other: No ascites or pneumoperitoneum.  Fatty umbilical hernia. Musculoskeletal: Generalized lumbar spine  degeneration Review of the MIP images confirms the above findings. IMPRESSION: 1. Large low-density pericardial effusion measuring nearly 4 cm in thickness. 2. Interval aortic valve and root repair. No acute vascular finding. 3. Eccentric thickening of the bladder wall with probable urothelial calcification on the left, recommend outpatient urology referral. 4. Hepatic steatosis. Electronically Signed   By: Jorje Guild M.D.   On: 09/28/2021 05:34    Procedures .Critical Care Performed by: Merryl Hacker, MD Authorized by: Merryl Hacker, MD   Critical care provider statement:    Critical care time (minutes):  60   Critical care was necessary to treat or prevent imminent or life-threatening deterioration of the following conditions:  Cardiac failure   Critical care was time spent personally by me on the following activities:  Development of treatment plan with patient or surrogate, discussions with consultants, evaluation of patient's response to treatment, examination of patient, ordering and review of laboratory studies, ordering and review of radiographic studies, ordering and performing treatments and interventions, pulse oximetry, re-evaluation of patient's condition and review of old charts    Medications Ordered in ED Medications  sodium chloride 0.9 % bolus 500 mL (0 mLs Intravenous Stopped 09/28/21 0642)  iohexol (OMNIPAQUE) 350 MG/ML injection 100 mL (100 mLs Intravenous Contrast Given 09/28/21 0517)    ED Course/ Medical Decision Making/ A&P Clinical Course as of 09/28/21 0647  Tue Sep 28, 2021  0342 Spoke with patient's wife who is now at the bedside.  States that he recently has endorsed that "he did not feel right."  She states that he did not speak of anything specifically but has had some abdominal discomfort for which she has been taking antacids. [CH]  5009 CT independently reviewed by myself.  Patient appears to have a large pericardial effusion.  Clinically he has no  evidence of tamponade at this time.  We will consult with cardiothoracic surgery and cardiology.  Likely will need a stat echocardiogram. [CH]  3818 Spoke with Dr. Darcey Nora.  Recommends stat echo.  This has been ordered.  Patient made n.p.o. and type and screen ordered.  Cardiology has already been consulted.  Awaiting callback. [CH]  [phone] Spoke to cardiology PA,INgold.  She will ensure patient gets echocardiogram. [  CH]    Clinical Course User Index [CH] Vittoria Noreen, Barbette Hair, MD                           Medical Decision Making Amount and/or Complexity of Data Reviewed Labs: ordered. Radiology: ordered.  Risk Prescription drug management. Decision regarding hospitalization.   This patient presents to the ED for concern of loss of consciousness, this involves an extensive number of treatment options, and is a complaint that carries with it a high risk of complications and morbidity.  I considered the following differential and admission for this acute, potentially life threatening condition.  The differential diagnosis includes syncope secondary to acute blood loss or arrhythmia, seizure, cardiac event  MDM:    This is a 66 year old male with recent history of aortic valve repair and ascending aortic graft who presents with an episode of loss of consciousness.  Patient has no recollection of the event.  Currently he does not have any significant complaints.  Wife does state that he complained of "not feeling right" and some upper abdominal discomfort which he was taking antacids for.  Initial vital signs are largely reassuring.  Heart rate is in the 60s.  Blood pressure 130/69.  O2 sats 97%.  Patient has a fairly significant recent clinical history.  Given absence of prodrome prior to syncope, would be concern for cardiac event.  No evidence of arrhythmia or ischemia on EKG.  Labs obtained.  Trope x2 stable in the low 40s.  He does have a slight leukocytosis to 15.5 with a left shift.  Creatinine  elevated slightly at 1.35.  Patient was given some fluids.  Dig level is normal.  Hepatic function testing is slightly elevated.  Feel it prudent to evaluate both his aorta and abdomen given unclear etiology of his syncope.  CT scan was obtained.  CT was independently evaluated by myself.  Patient has a large pericardial effusion.  Clinically he is not in tamponade and has remained hemodynamically stable.  Hemoglobin is stable and he is not currently on anticoagulants.  Will engage cardiothoracic surgery and cardiology.  Stat echocardiogram ordered.  6:47 AM See discussions with consultants above. (Labs, imaging, consults)  Labs: I Ordered, and personally interpreted labs.  The pertinent results include: CBC, CMP, BNP, troponin, digoxin level  Imaging Studies ordered: I ordered imaging studies including chest x-ray with cardiomegaly, CT scan I independently visualized and interpreted imaging. I agree with the radiologist interpretation  Additional history obtained from prior admission.  External records from outside source obtained and reviewed including OR notes  Cardiac Monitoring: .The patient was maintained on a cardiac monitor.  I personally viewed and interpreted the cardiac monitored which showed an underlying rhythm of: Sinus rhythm  Reevaluation: After the interventions noted above, I reevaluated the patient and found that they have :improved  Social Determinants of Health: .Lives independently with wife  Disposition: Admit  Co morbidities that complicate the patient evaluation . Past Medical History:  Diagnosis Date  . Anxiety    takes Valium as needed  . Aortic stenosis, moderate   . Arthritis   . Ascending aortic aneurysm ([place])   . CAD (coronary artery disease)    a. s/p PCI of the RCA 8/12 with DES by Dr Burt Knack, preserved EF. b. LHC/RHC (2/16) with mean RA 12, PA 32/15, mean PCWP 18, CI 3.47; patent mid and distal RCA stents, 50-60% proximal stenosis small PDA.      . Carotid  stenosis    a. Carotid US (05/2013):  Bilateral 1-39% ICA; L thyroid nodule (prior hx of aspiration).  . Chronic diastolic CHF (congestive heart failure) ([place])   . Complication of anesthesia    difficulty waking up after gallbladder surgery  . Depression   . Essential hypertension   . GERD (gastroesophageal reflux disease)    if needed will take OTC meds   . Heart murmur   . History of colonic polyps    hyperplastic  . Hyperlipidemia   . Joint pain   . Lesion of bladder   . Myocardial infarction ([place]) 2012  . Obesity (BMI 30-39.9) 02/29/2016  . Restless leg   . Sleep apnea    uses cpap  . Tubular adenoma of colon   . Vertigo    takes Meclizine as needed     Medicines Meds ordered this encounter  Medications  . sodium chloride 0.9 % bolus 500 mL  . iohexol (OMNIPAQUE) 350 MG/ML injection 100 mL    I have reviewed the patients home medicines and have made adjustments as needed  Problem List / ED Course: Problem List Items Addressed This Visit   None Visit Diagnoses     Syncope, unspecified syncope type    -  Primary   Pericardial effusion                       Final Clinical Impression(s) / ED Diagnoses Final diagnoses:  Syncope, unspecified syncope type  Pericardial effusion    Rx / DC Orders ED Discharge Orders     None         Aurie Harroun, Barbette Hair, MD 09/28/21 7681    Merryl Hacker, MD 09/28/21 1572    Merryl Hacker, MD 09/28/21 [phone]

## 2021-09-28 NOTE — ED Triage Notes (Signed)
Pt BIB [facility] from home, pt found unresponsive in the hallway, family heard him fall. Recent heart surgery. EMS reports respirations initially 4-6, NPA placed. On arrival to ED, pt unable to recall what happened, but answering questions appropriately. Denies pain.

## 2021-09-28 NOTE — Progress Notes (Signed)
  Echocardiogram 2D Echocardiogram has been performed.  Luis [name] 09/28/2021, 10:38 AM

## 2021-09-28 NOTE — Progress Notes (Signed)
RT set up CPAP with 3L O2 bled into circuit. Patient tolerating well at this time. RT will monitor as needed.

## 2021-09-28 NOTE — Consult Note (Addendum)
[address]       [place] [address]             [phone]     PCP:  Laurey Morale, MD              [facility] Providers Cardiologist:  None  Advanced Heart Failure:  [name], MD  Sleep Medicine:  Fransico Him, MD  `    Reason for Consult: Pericardial effusion Referring Physician: Darcel Bayley, MD  Luis [name] is an 66 y.o. male.  HPI: Patient is a 65 year old male well-known to [facility] having undergone a supra coronary replacement of the ascending aorta (hemiarch ) using a 30 x 10 mm Hemashield graft under deep hypothermic circulatory arrest and aortic valve replacement using a 25 mm Edwards Inspiris Resilia pericardial valve on 09/16/2021 by Dr. Cyndia Bent.  Other significant cardiac history includes previous stenting of the distal RCA.  He presented to the emergency department today with an episode of syncope.  Per EMS he was found in the hallway by his family after hearing him fall.  During transport to the emergency room he gradually became more alert and responsive.  A bedside echocardiogram performed he was found to have a significant pericardial effusion.  Initial recommendations were for cardiology admission and pericardiocentesis in the Cath Lab.  This was unsuccessful due to significant difficulty with access due to body habitus.  He will require subxiphoid pericardial window to drain the effusion.  Past Medical History:  Diagnosis Date   Anxiety    takes Valium as needed   Aortic stenosis, moderate    Arthritis    Ascending aortic aneurysm (HCC)    CAD (coronary artery disease)    a. s/p PCI of the RCA 8/12 with DES by Dr Burt Knack, preserved EF. b. LHC/RHC (2/16) with mean RA 12, PA 32/15, mean PCWP 18, CI 3.47; patent mid and distal RCA stents, 50-60% proximal stenosis small PDA.      Carotid stenosis    a. Carotid US (05/2013):  Bilateral 1-39% ICA; L thyroid nodule (prior hx of aspiration).   Chronic diastolic CHF (congestive heart  failure) (HCC)    Complication of anesthesia    difficulty waking up after gallbladder surgery   Depression    Essential hypertension    GERD (gastroesophageal reflux disease)    if needed will take OTC meds    Heart murmur    History of colonic polyps    hyperplastic   Hyperlipidemia    Joint pain    Lesion of bladder    Myocardial infarction ([place]) 2012   Obesity (BMI 30-39.9) 02/29/2016   Restless leg    Sleep apnea    uses cpap   Tubular adenoma of colon    Vertigo    takes Meclizine as needed    Past Surgical History:  Procedure Laterality Date   AORTIC VALVE REPLACEMENT N/A 09/16/2021   Procedure: AORTIC VALVE REPLACEMENT (AVR);  Surgeon: Gaye Pollack, MD;  Location: [facility];  Service: Open Heart Surgery;  Laterality: N/A;   CATARACT EXTRACTION   4 YRS AGO   BOTH EYES   CHOLECYSTECTOMY  07/21/2011   Procedure: LAPAROSCOPIC CHOLECYSTECTOMY WITH INTRAOPERATIVE CHOLANGIOGRAM;  Surgeon: Shann Medal, MD;  Location: [facility];  Service: General;  Laterality: N/A;   CORONARY ANGIOPLASTY  2012   2 stents   coronary stenting     s/p PCI of the RCA by Dr Burt Knack 8/12  with 2 promus stents   CYSTOSCOPY W/ RETROGRADES Bilateral 12/13/2019   Procedure: CYSTOSCOPY WITH RETROGRADE PYELOGRAM;  Surgeon: [place] Frock, MD;  Location[facility];  Service: Urology;  Laterality: Bilateral;   CYSTOSCOPY W/ RETROGRADES Bilateral 10/14/2020   Procedure: CYSTOSCOPY WITH RETROGRADE PYELOGRAM;  Surgeon: [place] Frock, MD;  Location: [facility];  Service: Urology;  Laterality: Bilateral;   CYSTOSCOPY W/ RETROGRADES Bilateral 12/16/2020   Procedure: CYSTOSCOPY WITH RETROGRADE PYELOGRAM;  Surgeon: [place] Frock, MD;  Location: [facility];  Service: Urology;  Laterality: Bilateral;   LEFT AND RIGHT HEART CATHETERIZATION WITH CORONARY ANGIOGRAM N/A 06/23/2014   Procedure: LEFT AND RIGHT HEART CATHETERIZATION WITH CORONARY ANGIOGRAM;  Surgeon: Larey Dresser, MD;  Location: [facility];  Service: Cardiovascular;  Laterality: N/A;   NECK SURGERY  03/23/09   per Dr. Lorin Mercy, cervical fusion    REPLACEMENT ASCENDING AORTA N/A 09/16/2021   Procedure: REPLACEMENT ASCENDING AORTA WITH 30 X 10MM HEMASHIELD PLATINUM WOVEN DOUBLE VELOUR VASCULAR GRAFT;  Surgeon: Gaye Pollack, MD;  Location: [facility];  Service: Open Heart Surgery;  Laterality: N/A;  CIRC ARREST   right elbow surgery     RIGHT HEART CATH AND CORONARY ANGIOGRAPHY N/A 07/08/2021   Procedure: RIGHT HEART CATH AND CORONARY ANGIOGRAPHY;  Surgeon: Larey Dresser, MD;  Location: [facility] CV LAB;  Service: Cardiovascular;  Laterality: N/A;   solonscopy  05/23/08   per Dr. Wynona Luna hemorrhoids only, repeat in 5 years   TEE WITHOUT CARDIOVERSION N/A 06/23/2014   Procedure: TRANSESOPHAGEAL ECHOCARDIOGRAM (TEE);  Surgeon: Larey Dresser, MD;  Location: [facility];  Service: Cardiovascular;  Laterality: N/A;   TEE WITHOUT CARDIOVERSION N/A 01/21/2016   Procedure: TRANSESOPHAGEAL ECHOCARDIOGRAM (TEE);  Surgeon: Larey Dresser, MD;  Location: [facility];  Service: Cardiovascular;  Laterality: N/A;   TEE WITHOUT CARDIOVERSION N/A 09/16/2021   Procedure: TRANSESOPHAGEAL ECHOCARDIOGRAM (TEE);  Surgeon: Gaye Pollack, MD;  Location: [facility];  Service: Open Heart Surgery;  Laterality: N/A;   TONSILLECTOMY     TRANSURETHRAL RESECTION OF BLADDER TUMOR N/A 10/21/2019   Procedure: TRANSURETHRAL RESECTION OF BLADDER TUMOR (TURBT);  Surgeon: Kathie Rhodes, MD;  Location: [facility];  Service: Urology;  Laterality: N/A;   TRANSURETHRAL RESECTION OF BLADDER TUMOR N/A 12/13/2019   Procedure: TRANSURETHRAL RESECTION OF BLADDER TUMOR (TURBT);  Surgeon: [place] Frock, MD;  Location: [facility];  Service: Urology;  Laterality: N/A;  1 HR   TRANSURETHRAL RESECTION OF BLADDER TUMOR N/A 10/14/2020   Procedure: TRANSURETHRAL RESECTION OF BLADDER TUMOR (TURBT);  Surgeon: [place] Frock, MD;  Location: [facility];  Service: Urology;  Laterality: N/A;   TRANSURETHRAL RESECTION OF BLADDER TUMOR N/A 12/16/2020   Procedure: RESTAGING TRANSURETHRAL RESECTION OF BLADDER TUMOR (TURBT);  Surgeon: [place] Frock, MD;  Location: [facility];  Service: Urology;  Laterality: N/A;    Family History  Problem Relation Age of Onset   Lung cancer Mother        lung   Esophageal cancer Cousin    Colon cancer Neg Hx    Rectal cancer Neg Hx    Stomach cancer Neg Hx     Social History:  reports that he quit smoking about 11 years ago. His smoking use included cigarettes. He has a 80.00 pack-year smoking history. He has never used smokeless tobacco. He reports that he does not drink alcohol and does not use drugs.  Allergies:  Allergies  Allergen Reactions  Oxycodone Other (See Comments)    Insomnia, "makes pt crazy".    Medications:  Current Outpatient Medications  Medication Instructions   acetaminophen (TYLENOL) 500-1,000 mg, Oral, Every 6 hours PRN   amiodarone (PACERONE) 400 mg, Oral, 2 times daily, X 7 days, then decrease to 200 mg BID x 7 days, then decrease to 200 mg daily   aspirin 81 mg, Oral, BH-each morning   carvedilol (COREG) 3.125 mg, Oral, 2 times daily with meals   digoxin (LANOXIN) 0.25 mg, Oral, Daily   furosemide (LASIX) 40 MG tablet TAKE 1 TABLET (40 MG TOTAL) BY MOUTH DAILY AND 1/2 TABLETS (20 MG TOTAL) EVERY EVENING.   ipratropium (ATROVENT) 0.03 % nasal spray 2 sprays, Each Nare, Every 12 hours PRN   MAGNESIUM PO 500 mg, Oral, Daily   meclizine (ANTIVERT) 25 mg, Oral, 3 times daily PRN   multivitamin-iron-minerals-folic acid (CENTRUM) chewable tablet 1 tablet, Oral, Daily   nitroGLYCERIN (NITROSTAT) 0.4 mg, Sublingual, Every 5 min PRN, For chest pain.   potassium chloride SA (KLOR-CON M) 20 MEQ tablet 40 mEq, Oral, Daily   rosuvastatin (CRESTOR) 20 MG tablet TAKE 1 TABLET BY MOUTH EVERYDAY AT BEDTIME   tamsulosin  (FLOMAX) 0.4 mg, Oral, Daily   traMADol (ULTRAM) 50-100 mg, Oral, Every 4 hours PRN   venlafaxine XR (EFFEXOR-XR) 150 MG 24 hr capsule TAKE 1 CAPSULE BY MOUTH DAILY WITH BREAKFAST.     Results for orders placed or performed during the hospital encounter of 09/28/21 (from the past 48 hour(s))  CBC with Differential     Status: Abnormal   Collection Time: 09/28/21  2:28 AM  Result Value Ref Range   WBC 15.5 (H) 4.0 - 10.5 K/uL   RBC 2.74 (L) 4.22 - 5.81 MIL/uL   Hemoglobin 8.0 (L) 13.0 - 17.0 g/dL   HCT 26.1 (L) 39.0 - 52.0 %   MCV 95.3 80.0 - 100.0 fL   MCH 29.2 26.0 - 34.0 pg   MCHC 30.7 30.0 - 36.0 g/dL   RDW 14.8 11.5 - 15.5 %   Platelets 340 150 - 400 K/uL   nRBC 0.2 0.0 - 0.2 %   Neutrophils Relative % 71 %   Neutro Abs 11.1 (H) 1.7 - 7.7 K/uL   Lymphocytes Relative 17 %   Lymphs Abs 2.7 0.7 - 4.0 K/uL   Monocytes Relative 7 %   Monocytes Absolute 1.1 (H) 0.1 - 1.0 K/uL   Eosinophils Relative 2 %   Eosinophils Absolute 0.3 0.0 - 0.5 K/uL   Basophils Relative 1 %   Basophils Absolute 0.1 0.0 - 0.1 K/uL   Immature Granulocytes 2 %   Abs Immature Granulocytes 0.32 (H) 0.00 - 0.07 K/uL    Comment: Performed at [facility], [address] [place], [place] [address]  Basic metabolic panel     Status: Abnormal   Collection Time: 09/28/21  2:28 AM  Result Value Ref Range   Sodium 135 135 - 145 mmol/L   Potassium 4.1 3.5 - 5.1 mmol/L   Chloride 100 98 - 111 mmol/L   CO2 23 22 - 32 mmol/L   Glucose, Bld 179 (H) 70 - 99 mg/dL    Comment: Glucose reference range applies only to samples taken after fasting for at least 8 hours.   BUN 22 8 - 23 mg/dL   Creatinine, Ser 1.35 (H) 0.61 - 1.24 mg/dL   Calcium 8.1 (L) 8.9 - 10.3 mg/dL   GFR, Estimated 58 (L) >60 mL/min  Comment: (NOTE) Calculated using the CKD-EPI Creatinine Equation (2021)    Anion gap 12 5 - 15    Comment: Performed at [facility], [place] [address] [place], [place] [address]  Brain natriuretic  peptide     Status: Abnormal   Collection Time: 09/28/21  2:28 AM  Result Value Ref Range   B Natriuretic Peptide 171.0 (H) 0.0 - 100.0 pg/mL    Comment: Performed at [facility] [address], [place], [place] [address]  Troponin I (High Sensitivity)     Status: Abnormal   Collection Time: 09/28/21  2:28 AM  Result Value Ref Range   Troponin I (High Sensitivity) 46 (H) <18 ng/L    Comment: (NOTE) Elevated high sensitivity troponin I (hsTnI) values and significant  changes across serial measurements may suggest ACS but many other  chronic and acute conditions are known to elevate hsTnI results.  Refer to the "Links" section for chest pain algorithms and additional  guidance. Performed at [facility], [place] [address] [place], [place] [address]   Digoxin level     Status: None   Collection Time: 09/28/21  2:28 AM  Result Value Ref Range   Digoxin Level 1.3 0.8 - 2.0 ng/mL    Comment: Performed at [facility] [address] [place], [place] [address]  Hepatic function panel     Status: Abnormal   Collection Time: 09/28/21  2:28 AM  Result Value Ref Range   Total Protein 6.2 (L) 6.5 - 8.1 g/dL   Albumin 2.8 (L) 3.5 - 5.0 g/dL   AST 69 (H) 15 - 41 U/L   ALT 92 (H) 0 - 44 U/L   Alkaline Phosphatase 124 38 - 126 U/L   Total Bilirubin 1.0 0.3 - 1.2 mg/dL   Bilirubin, Direct 0.3 (H) 0.0 - 0.2 mg/dL   Indirect Bilirubin 0.7 0.3 - 0.9 mg/dL    Comment: Performed at [facility] [address] [place], [place] [address]  Lipase, blood     Status: None   Collection Time: 09/28/21  2:28 AM  Result Value Ref Range   Lipase 39 11 - 51 U/L    Comment: Performed at [facility] [address] [place], [place] [address]  Troponin I (High Sensitivity)     Status: Abnormal   Collection Time: 09/28/21  4:46 AM  Result Value Ref Range   Troponin I (High Sensitivity) 43 (H) <18 ng/L    Comment: (NOTE) Elevated high sensitivity troponin I (hsTnI) values  and significant  changes across serial measurements may suggest ACS but many other  chronic and acute conditions are known to elevate hsTnI results.  Refer to the "Links" section for chest pain algorithms and additional  guidance. Performed at [facility], [place] [address] [place], [place] [address]   Type and screen [place]     Status: None   Collection Time: 09/28/21  6:47 AM  Result Value Ref Range   ABO/RH(D) A NEG    Antibody Screen NEG    Sample Expiration      10/01/2021,2359 Performed at [facility], [place] [address], [place], [place] [address]   Protime-INR     Status: Abnormal   Collection Time: 09/28/21  8:56 AM  Result Value Ref Range   Prothrombin Time 15.9 (H) 11.4 - 15.2 seconds   INR 1.3 (H) 0.8 - 1.2    Comment: (NOTE)  INR goal varies based on device and disease states. Performed at [facility], [place] [address], [place], [place] [address]   APTT     Status: None   Collection Time: 09/28/21  8:56 AM  Result Value Ref Range   aPTT 34 24 - 36 seconds    Comment: Performed at [facility] [address] [place] [place], [place] [address]    CT Head Wo Contrast  Result Date: 09/28/2021 CLINICAL DATA:  Head trauma EXAM: CT HEAD WITHOUT CONTRAST TECHNIQUE: Contiguous axial images were obtained from the base of the skull through the vertex without intravenous contrast. RADIATION DOSE REDUCTION: This exam was performed according to the departmental dose-optimization program which includes automated exposure control, adjustment of the mA and/or kV according to patient size and/or use of iterative reconstruction technique. COMPARISON:  None Available. FINDINGS: Brain: There is no mass, hemorrhage or extra-axial collection. The size and configuration of the ventricles and extra-axial CSF spaces are normal. The brain parenchyma is normal, without acute or chronic infarction. Vascular: No abnormal hyperdensity of the major intracranial  arteries or dural venous sinuses. No intracranial atherosclerosis. Skull: The visualized skull base, calvarium and extracranial soft tissues are normal. Sinuses/Orbits: No fluid levels or advanced mucosal thickening of the visualized paranasal sinuses. No mastoid or middle ear effusion. The orbits are normal. IMPRESSION: Normal head CT. Electronically Signed   By: Ulyses Jarred M.D.   On: 09/28/2021 04:07   CARDIAC CATHETERIZATION  Result Date: 09/28/2021   Unsuccessful attempt at pericardiocentesis. We will discuss with cardiac surgeons regarding possibility of pericardial window.   DG Chest Portable 1 View  Result Date: 09/28/2021 CLINICAL DATA:  Syncope. EXAM: PORTABLE CHEST 1 VIEW COMPARISON:  Sep 20, 2021 FINDINGS: Multiple sternal wires are noted. The right-sided PICC line seen on the prior exam has been removed. There is no evidence of focal consolidation, pleural effusion or pneumothorax. The cardiac silhouette is markedly enlarged and unchanged in size. An artificial aortic valve is seen. A radiopaque fusion plate and screws are seen overlying the cervical spine. No acute osseous abnormalities are identified. IMPRESSION: Stable cardiomegaly without evidence of acute or active cardiopulmonary disease. Electronically Signed   By: Virgina Norfolk M.D.   On: 09/28/2021 02:59   ECHOCARDIOGRAM COMPLETE  Result Date: 09/28/2021    ECHOCARDIOGRAM REPORT   Patient Name:   Luis [name] Date of Exam: 09/28/2021 Medical Rec #:  [id number]        Height:       66.0 in Accession #:    [id number]       Weight:       277.9 lb Date of Birth:  11/15/1955        BSA:          2.300 m Patient Age:    58 years         BP:           109/48 mmHg Patient Gender: M                HR:           60 bpm. Exam Location:  Inpatient Procedure: 2D Echo, Cardiac Doppler and Color Doppler STAT ECHO Indications:    I31.3 Pericardial effusion (noninflammatory)  History:        Patient has prior history of Echocardiogram  examinations, most                 recent 07/01/2021. CHF, CAD, Aortic Valve  Disease; Risk                 Factors:Hypertension. Aortic stenosis. AVR.  Sonographer:    Roseanna Rainbow RDCS Referring Phys: [id number] [place]  1. Large pericardial effusion. Measures 3.1cm adjacent to RV. There is evidence of tamponade physiology, with RV diastolic collapse seen.  2. Left ventricular ejection fraction, by estimation, is 60 to 65%. The left ventricle has normal function. The left ventricle has no regional wall motion abnormalities. There is moderate left ventricular hypertrophy. Left ventricular diastolic parameters are indeterminate.  3. Right ventricular systolic function is normal. The right ventricular size is normal. Tricuspid regurgitation signal is inadequate for assessing PA pressure.  4. The mitral valve is normal in structure. No evidence of mitral valve regurgitation.  5. There is a 25 mm Edwards Inspiris valve present in the aortic position     Aortic valve regurgitation is not visualized. Echo findings are consistent with normal structure and function of the aortic valve prosthesis. Aortic valve mean gradient measures 10.0 mmHg.  6. The inferior vena cava is dilated in size with <50% respiratory variability, suggesting right atrial pressure of 15 mmHg. FINDINGS  Left Ventricle: Left ventricular ejection fraction, by estimation, is 60 to 65%. The left ventricle has normal function. The left ventricle has no regional wall motion abnormalities. The left ventricular internal cavity size was normal in size. There is  moderate left ventricular hypertrophy. Left ventricular diastolic parameters are indeterminate. Right Ventricle: The right ventricular size is normal. No increase in right ventricular wall thickness. Right ventricular systolic function is normal. Tricuspid regurgitation signal is inadequate for assessing PA pressure. Left Atrium: Left atrial size was normal in size. Right Atrium: Right  atrial size was normal in size. Pericardium: A large pericardial effusion is present. Mitral Valve: The mitral valve is normal in structure. No evidence of mitral valve regurgitation. Tricuspid Valve: The tricuspid valve is normal in structure. Tricuspid valve regurgitation is trivial. Aortic Valve: The aortic valve has been repaired/replaced. Aortic valve regurgitation is not visualized. Aortic valve mean gradient measures 10.0 mmHg. Aortic valve peak gradient measures 20.9 mmHg. Aortic valve area, by VTI measures 2.02 cm. There is a  25 mm Edwards Inspiris valve present in the aortic position. Echo findings are consistent with normal structure and function of the aortic valve prosthesis. Pulmonic Valve: The pulmonic valve was grossly normal. Pulmonic valve regurgitation is not visualized. Aorta: The aortic root and ascending aorta are structurally normal, with no evidence of dilitation. Venous: The inferior vena cava is dilated in size with less than 50% respiratory variability, suggesting right atrial pressure of 15 mmHg. IAS/Shunts: The interatrial septum was not well visualized.  LEFT VENTRICLE PLAX 2D LVIDd:         4.90 cm LVIDs:         3.30 cm LV PW:         1.70 cm LV IVS:        1.20 cm LVOT diam:     2.50 cm LV SV:         79 LV SV Index:   34 LVOT Area:     4.91 cm  IVC IVC diam: 2.80 cm AORTIC VALVE AV Area (Vmax):    2.30 cm AV Area (Vmean):   2.19 cm AV Area (VTI):     2.02 cm AV Vmax:           228.33 cm/s AV Vmean:  143.667 cm/s AV VTI:            0.391 m AV Peak Grad:      20.9 mmHg AV Mean Grad:      10.0 mmHg LVOT Vmax:         107.00 cm/s LVOT Vmean:        64.000 cm/s LVOT VTI:          0.161 m LVOT/AV VTI ratio: 0.41  AORTA Ao Root diam: 3.60 cm Ao Asc diam:  3.10 cm  SHUNTS Systemic VTI:  0.16 m Systemic Diam: 2.50 cm Oswaldo Milian MD Electronically signed by Oswaldo Milian MD Signature Date/Time: 09/28/2021/8:30:06 AM    Final    CT Angio Chest/Abd/Pel for  Dissection W and/or Wo Contrast  Result Date: 09/28/2021 CLINICAL DATA:  Found unresponsive in hallway at home. EXAM: CT ANGIOGRAPHY CHEST, ABDOMEN AND PELVIS TECHNIQUE: Non-contrast CT of the chest was initially obtained. Multidetector CT imaging through the chest, abdomen and pelvis was performed using the standard protocol during bolus administration of intravenous contrast. Multiplanar reconstructed images and MIPs were obtained and reviewed to evaluate the vascular anatomy. RADIATION DOSE REDUCTION: This exam was performed according to the departmental dose-optimization program which includes automated exposure control, adjustment of the mA and/or kV according to patient size and/or use of iterative reconstruction technique. CONTRAST:  118m OMNIPAQUE IOHEXOL 350 MG/ML SOLN COMPARISON:  07/15/2021 FINDINGS: CTA CHEST FINDINGS Cardiovascular: Interval aortic valve/root repair. Large low-density pericardial effusion which is simple fluid density. The effusion measures up to 3.8 cm in thickness along the right heart border. No evidence of aortic dissection. No noted pulmonary emboli. Mediastinum/Nodes: No high-density hematoma or rim enhancing collection. Lungs/Pleura: Small pleural effusions. No pulmonary edema or pneumonia. Musculoskeletal: Sternotomy wires are intact with unremarkable osteotomy margins. No acute finding Review of the MIP images confirms the above findings. CTA ABDOMEN AND PELVIS FINDINGS VASCULAR Aorta: Atheromatous plaque.  No aneurysm or dissection Celiac: Separate origin of the celiac and hepatic arteries. No acute finding SMA: No acute finding or proximal flow limiting stenosis Renals: Unremarkable IMA: Patent Inflow: Atheromatous plaque without stenosis or aneurysm Veins: Unremarkable Review of the MIP images confirms the above findings. NON-VASCULAR Hepatobiliary: Hepatic steatosis based on the noncontrast phase. Cholecystectomy. Pancreas: No acute finding Spleen: Negative  Adrenals/Urinary Tract: Simple left renal cyst at the lower pole. No follow-up required. No hydronephrosis. Negative adrenals. Partially distended bladder which shows left upper eccentric thickening with urothelial based calcific density. Stomach/Bowel: No acute finding Lymphatic: Negative for adenopathy Reproductive: No acute finding Other: No ascites or pneumoperitoneum.  Fatty umbilical hernia. Musculoskeletal: Generalized lumbar spine degeneration Review of the MIP images confirms the above findings. IMPRESSION: 1. Large low-density pericardial effusion measuring nearly 4 cm in thickness. 2. Interval aortic valve and root repair. No acute vascular finding. 3. Eccentric thickening of the bladder wall with probable urothelial calcification on the left, recommend outpatient urology referral. 4. Hepatic steatosis. Electronically Signed   By: JJorje GuildM.D.   On: 09/28/2021 05:34    Review of Systems  Constitutional:  Positive for appetite change and fatigue.  Respiratory:  Positive for shortness of breath.   Cardiovascular:  Positive for leg swelling.  Gastrointestinal:  Positive for abdominal pain.  Neurological:  Positive for syncope.  Blood pressure 105/80, pulse (!) 0, temperature (!) 97 F (36.1 C), temperature source Temporal, resp. rate 20, SpO2 98 %. Physical Exam Constitutional:      General: He is not in acute distress.  Appearance: He is obese. He is ill-appearing.  HENT:     Head: Normocephalic and atraumatic.     Mouth/Throat:     Mouth: Mucous membranes are moist.     Pharynx: No oropharyngeal exudate.  Eyes:     General: No scleral icterus.    Extraocular Movements: Extraocular movements intact.     Pupils: Pupils are equal, round, and reactive to light.  Cardiovascular:     Rate and Rhythm: Normal rate and regular rhythm.     Heart sounds: No murmur heard.   No friction rub. No gallop.  Pulmonary:     Effort: Pulmonary effort is normal.     Breath sounds: Normal  breath sounds. No wheezing or rales.  Abdominal:     General: There is distension.     Palpations: There is no mass.     Tenderness: There is abdominal tenderness. There is no guarding or rebound.     Hernia: A hernia is present.     Comments: Umbilical hernia, + LLQ TTP- mild  Musculoskeletal:     Cervical back: Normal range of motion.     Right lower leg: Edema present.     Left lower leg: Edema present.  Skin:    General: Skin is warm and dry.     Capillary Refill: Capillary refill takes less than 2 seconds.     Coloration: Skin is not jaundiced.     Findings: No rash.  Neurological:     General: No focal deficit present.     Mental Status: He is alert and oriented to person, place, and time.     Sensory: No sensory deficit.     Motor: Weakness present.     Coordination: Coordination normal.     Comments: Generalized weakness  Psychiatric:        Mood and Affect: Mood normal.        Behavior: Behavior normal.        Thought Content: Thought content normal.        Judgment: Judgment normal.    Assessment/Plan: Postoperative pericardial effusion following above described procedure.  Plan is for subxiphoid pericardial window  John Giovanni 09/28/2021, 10:25 AM

## 2021-09-28 NOTE — Progress Notes (Signed)
  Echocardiogram 2D Echocardiogram has been performed.  Luis [name] 09/28/2021, 10:40 AM

## 2021-09-28 NOTE — Transfer of Care (Signed)
Immediate Anesthesia Transfer of Care Note  Patient: Luis [name]  Procedure(s) Performed: SUBXYPHOID PERICARDIAL WINDOW TRANSESOPHAGEAL ECHOCARDIOGRAM (TEE)  Patient Location: ICU  Anesthesia Type:General  Level of Consciousness: awake and alert   Airway & Oxygen Therapy: Patient Spontanous Breathing and Patient connected to nasal cannula oxygen  Post-op Assessment: Report given to RN and Post -op Vital signs reviewed and stable  Post vital signs: Reviewed and stable  Last Vitals:  Vitals Value Taken Time  BP 102/45 09/28/21 1730  Temp    Pulse 60 09/28/21 1735  Resp 15 09/28/21 1735  SpO2 96 % 09/28/21 1735  Vitals shown include unvalidated device data.  Last Pain:  Vitals:   09/28/21 1429  TempSrc: Oral  PainSc: 0-No pain         Complications: No notable events documented.

## 2021-09-29 ENCOUNTER — Encounter (HOSPITAL_COMMUNITY): Payer: Self-pay | Admitting: Surgery

## 2021-09-29 ENCOUNTER — Inpatient Hospital Stay (HOSPITAL_COMMUNITY): Payer: PPO

## 2021-09-29 LAB — CBC
HCT: 25.6 % — ABNORMAL LOW (ref 39.0–52.0)
Hemoglobin: 7.8 g/dL — ABNORMAL LOW (ref 13.0–17.0)
MCH: 28.7 pg (ref 26.0–34.0)
MCHC: 30.5 g/dL (ref 30.0–36.0)
MCV: 94.1 fL (ref 80.0–100.0)
Platelets: 318 10*3/uL (ref 150–400)
RBC: 2.72 MIL/uL — ABNORMAL LOW (ref 4.22–5.81)
RDW: 15.1 % (ref 11.5–15.5)
WBC: 15.5 10*3/uL — ABNORMAL HIGH (ref 4.0–10.5)
nRBC: 0.1 % (ref 0.0–0.2)

## 2021-09-29 LAB — BASIC METABOLIC PANEL
Anion gap: 6 (ref 5–15)
BUN: 21 mg/dL (ref 8–23)
CO2: 25 mmol/L (ref 22–32)
Calcium: 7.9 mg/dL — ABNORMAL LOW (ref 8.9–10.3)
Chloride: 105 mmol/L (ref 98–111)
Creatinine, Ser: 1.1 mg/dL (ref 0.61–1.24)
GFR, Estimated: >60 mL/min (ref >60)
Glucose, Bld: 148 mg/dL — ABNORMAL HIGH (ref 70–99)
Potassium: 4.2 mmol/L (ref 3.5–5.1)
Sodium: 136 mmol/L (ref 135–145)

## 2021-09-29 LAB — GLUCOSE, CAPILLARY
Glucose-Capillary: 136 mg/dL — ABNORMAL HIGH (ref 70–99)
Glucose-Capillary: 124 mg/dL — ABNORMAL HIGH (ref 70–99)
Glucose-Capillary: 99 mg/dL (ref 70–99)

## 2021-09-29 LAB — DIGOXIN LEVEL: Digoxin Level: 1.1 ng/mL (ref 0.8–2.0)

## 2021-09-29 MED ORDER — CARVEDILOL 3.125 MG PO TABS
3.1250 mg | ORAL_TABLET | Freq: Two times a day (BID) | ORAL | Status: DC
  Administered 2021-09-29 – 2021-10-03 (×8): 3.125 mg via ORAL
  Filled 2021-09-29 (×8): qty 1

## 2021-09-29 MED ORDER — FE FUMARATE-B12-VIT C-FA-IFC PO CAPS
1.0000 | ORAL_CAPSULE | Freq: Two times a day (BID) | ORAL | Status: DC
  Administered 2021-09-29 – 2021-10-03 (×9): 1 via ORAL
  Filled 2021-09-29 (×9): qty 1

## 2021-09-29 MED ORDER — FUROSEMIDE 10 MG/ML IJ SOLN
40.0000 mg | Freq: Once | INTRAMUSCULAR | Status: AC
  Administered 2021-09-29: 40 mg via INTRAVENOUS
  Filled 2021-09-29: qty 4

## 2021-09-29 MED ORDER — DIGOXIN 125 MCG PO TABS
0.2500 mg | ORAL_TABLET | Freq: Every day | ORAL | Status: DC
  Administered 2021-09-29: 0.25 mg via ORAL
  Filled 2021-09-29: qty 2

## 2021-09-29 MED ORDER — POTASSIUM CHLORIDE CRYS ER 20 MEQ PO TBCR
20.0000 meq | EXTENDED_RELEASE_TABLET | Freq: Two times a day (BID) | ORAL | Status: AC
Start: 2021-09-29 — End: 2021-09-29
  Administered 2021-09-29 (×2): 20 meq via ORAL
  Filled 2021-09-29 (×2): qty 1

## 2021-09-29 MED FILL — Lidocaine HCl Local Preservative Free (PF) Inj 1%: INTRAMUSCULAR | Qty: 30 | Status: AC

## 2021-09-29 NOTE — Progress Notes (Signed)
1 Day Post-Op Procedure(s) (LRB): SUBXYPHOID PERICARDIAL WINDOW (N/A) TRANSESOPHAGEAL ECHOCARDIOGRAM (TEE) (N/A) Subjective: Feels better than preop. Ambulated this am.   Objective: Vital signs in last 24 hours: Temp:  [97.7 F (36.5 C)-98.6 F (37 C)] 98.4 F (36.9 C) (06/07 0730) Pulse Rate:  [0-76] 76 (06/07 0700) Cardiac Rhythm: Normal sinus rhythm (06/06 2000) Resp:  [13-24] 22 (06/07 0700) BP: (95-133)/(48-114) 111/56 (06/07 0600) SpO2:  [83 %-100 %] 83 % (06/07 0700) Arterial Line BP: (129-160)/(49-59) 142/51 (06/07 0600) Weight:  [126.5 kg] 126.5 kg (06/07 0600)  Hemodynamic parameters for last 24 hours:    Intake/Output from previous day: 06/06 0701 - 06/07 0700 In: 1849.6 [P.O.:720; I.V.:879.5; IV Piggyback:250.1] Out: 1211 [Urine:1075; Blood:10; Chest Tube:126] Intake/Output this shift: No intake/output data recorded.  General appearance: alert and cooperative Neurologic: intact Heart: regular rate and rhythm, S1, S2 normal, no murmur Lungs: clear to auscultation bilaterally Extremities: edema mild Wound: dressing dry Chest tube output low, thin serosanguinous.  Lab Results: Recent Labs    09/28/21 2317 09/29/21 0314  WBC 16.9* 15.5*  HGB 8.2* 7.8*  HCT 26.2* 25.6*  PLT 324 318   BMET:  Recent Labs    09/28/21 2317 09/29/21 0314  NA 136 136  K 4.6 4.2  CL 102 105  CO2 26 25  GLUCOSE 159* 148*  BUN 22 21  CREATININE 1.13 1.10  CALCIUM 7.9* 7.9*    PT/INR:  Recent Labs    09/28/21 0856  LABPROT 15.9*  INR 1.3*   ABG    Component Value Date/Time   PHART 7.351 09/16/2021 2204   HCO3 23.8 09/16/2021 2204   TCO2 25 09/16/2021 2204   ACIDBASEDEF 2.0 09/16/2021 2204   O2SAT 98 09/16/2021 2204   CBG (last 3)  Recent Labs    09/28/21 2255 09/29/21 0330 09/29/21 0701  GLUCAP 159* 136* 124*   CXR: ok  Assessment/Plan: S/P Procedure(s) (LRB): SUBXYPHOID PERICARDIAL WINDOW (N/A) TRANSESOPHAGEAL ECHOCARDIOGRAM (TEE) (N/A)  POD  1 Hemodynamically stable in sinus rhythm. Resume Coreg and amio/dig for postop atrial fib after his original surgery. Decrease amio to 200 bid tomorrow.  Continue chest tube today.  DC arterial line and foley.  Diurese some and dc IVF.  Keep central line in for access.  Ambulate, IS.  Acute blood loss anemia: His Hgb was on the low side when he went home and a little lower after IV hydration yesterday. His BP is fine and he was able to ambulate so will follow and start iron.   LOS: 1 day    Luis [name] 09/29/2021

## 2021-09-29 NOTE — Progress Notes (Signed)
CT Surgery  Resting w/o complaint Min output from pericardial drain Nsr  Blood pressure 113/71, pulse 63, temperature 98.1 F (36.7 C), temperature source Axillary, resp. rate 18, height '5\' 6"'$  (1.676 m), weight 126.5 kg, SpO2 99 %.

## 2021-09-29 NOTE — Anesthesia Postprocedure Evaluation (Signed)
Anesthesia Post Note  Patient: Luis [name]  Procedure(s) Performed: SUBXYPHOID PERICARDIAL WINDOW TRANSESOPHAGEAL ECHOCARDIOGRAM (TEE)     Patient location during evaluation: Other Anesthesia Type: General Level of consciousness: awake and alert Pain management: pain level controlled Vital Signs Assessment: post-procedure vital signs reviewed and stable Respiratory status: spontaneous breathing, nonlabored ventilation, respiratory function stable and patient connected to nasal cannula oxygen Cardiovascular status: blood pressure returned to baseline and stable Postop Assessment: no apparent nausea or vomiting Anesthetic complications: no   No notable events documented.  Last Vitals:  Vitals:   09/29/21 0730 09/29/21 0810  BP:  (!) 118/52  Pulse:  71  Resp:  18  Temp: 36.9 C   SpO2:  98%    Last Pain:  Vitals:   09/29/21 0800  TempSrc:   PainSc: 0-No pain                 Denaja Verhoeven,W. EDMOND

## 2021-09-29 NOTE — Hospital Course (Addendum)
Hospital Course:  Luis [name] is a 66 yo male known to [facility].  He has a history of hypertension, hyperlipidemia, coronary artery disease status post PCI of the RCA in 2012 with DES,  bladder cancer that has been treated with TURBT and chemotherapy with BCG, and moderate aortic stenosis.  His most recent echo on 07/01/2021 showed an increase in the mean gradient to 48 mmHg with a peak gradient of 76 mmHg.  Valve area by VTI was 0.91 cm.  The valve appeared bicuspid.  The ascending aorta was measured at 4.5 cm.  Left ventricular ejection fraction was 60 to 65% with mild concentric LVH and grade 1 diastolic dysfunction.  Cardiac catheterization on 07/08/2021 showed patent stents in the RCA and otherwise no significant disease.  He was referred to [facility] for surgical consultation.  He was evaluated by Dr. Cyndia Bent at which time he continued to have exertional shortness of breath and fatigue.  He denies any chest pain or pressure.  He denies dizziness and syncope.  He has had no orthopnea or PND.  He denies peripheral edema.  His activity has primarily been limited by degenerative arthritis in his knees.  He had initially undergone workup for TAVR, however he was noted to have an enlarged Aorta on his CTA of the chest.  He underwent Aortic Valve Replacement and Replacement of Ascending Aorta on 09/16/2021.  His hospital course was complicated by Atrial Fibrillation with RVR.  He also required transfusions for anemia due to hematuria likely related to previous bladder cancer.  He also required IV diuretics for volume overloaded state.  The  patient was discharged home on 09/23/2021.  The  patient presented to the ED on 09/28/2021 via EMS after suffering a syncopal episode at home.  He was found unresponsive by family members.  Workup in the ED showed a mildy elevated creatinine at 1.35, Hgb was stable at 8.0.  CT of the head was negative, but CTA of the chest showed a large low density pericardial  effusion measuring nearly 4 cm.  Due to this Echocardiogram was performed which showed evidence of tamponade physiology.  Admission was recommended   Hospital Course:   Cardiology was consulted for admission.  They took patient to the catheterization lab for Pericardiocentesis.  Unfortunately they were unable to perform. Patient was then taken to the operating room for sub-xiphoid pericardial window creation.  This removed 850 ml of bloody fluid.  He tolerated the procedure without difficulty, was extubated and taken to the SICU in stable condition.  He was maintaining NSR.   The patient was resumed on home Coreg.  His Amiodarone was also resumed at a reduced dose.  The patient was started on diuretics for volume overloaded state.  He was started on iron therapy for his anemia.  His chest tube output decreased and his chest tube was removed on 09/30/2021.  He was stable for transfer to the progressive care unit.  The patient complained of numbness in his lower legs and bottom of his feet.  He was also complaining of nausea making it difficult for him to eat.  Due to this his Amiodarone was discontinued.  He remains on Coreg and Digoxin;however, cardiology stopped Digoxin on 06/10.   He is ambulating without difficulty.  His surgical incisions are healing without evidence of infection.  He does have LE edema and both feet are swollen. He was instructed to resume home regimen of Lasix and potassium supplementation at discharge. Patient is  feeling better overall (legs/bottom of feet still are numb). He is medically stable for discharge home today.

## 2021-09-30 ENCOUNTER — Inpatient Hospital Stay (HOSPITAL_COMMUNITY): Payer: PPO

## 2021-09-30 ENCOUNTER — Encounter (HOSPITAL_COMMUNITY): Payer: PPO | Admitting: Cardiology

## 2021-09-30 DIAGNOSIS — Z9889 Other specified postprocedural states: Secondary | ICD-10-CM

## 2021-09-30 DIAGNOSIS — I3139 Other pericardial effusion (noninflammatory): Secondary | ICD-10-CM

## 2021-09-30 DIAGNOSIS — I480 Paroxysmal atrial fibrillation: Secondary | ICD-10-CM | POA: Diagnosis not present

## 2021-09-30 DIAGNOSIS — R55 Syncope and collapse: Secondary | ICD-10-CM | POA: Diagnosis not present

## 2021-09-30 LAB — CBC
HCT: 25.7 % — ABNORMAL LOW (ref 39.0–52.0)
Hemoglobin: 7.9 g/dL — ABNORMAL LOW (ref 13.0–17.0)
MCH: 29 pg (ref 26.0–34.0)
MCHC: 30.7 g/dL (ref 30.0–36.0)
MCV: 94.5 fL (ref 80.0–100.0)
Platelets: 308 10*3/uL (ref 150–400)
RBC: 2.72 MIL/uL — ABNORMAL LOW (ref 4.22–5.81)
RDW: 15.5 % (ref 11.5–15.5)
WBC: 16.3 10*3/uL — ABNORMAL HIGH (ref 4.0–10.5)
nRBC: 0 % (ref 0.0–0.2)

## 2021-09-30 LAB — BASIC METABOLIC PANEL
Anion gap: 6 (ref 5–15)
BUN: 22 mg/dL (ref 8–23)
CO2: 27 mmol/L (ref 22–32)
Calcium: 7.9 mg/dL — ABNORMAL LOW (ref 8.9–10.3)
Chloride: 102 mmol/L (ref 98–111)
Creatinine, Ser: 1.16 mg/dL (ref 0.61–1.24)
GFR, Estimated: >60 mL/min (ref >60)
Glucose, Bld: 118 mg/dL — ABNORMAL HIGH (ref 70–99)
Potassium: 3.8 mmol/L (ref 3.5–5.1)
Sodium: 135 mmol/L (ref 135–145)

## 2021-09-30 LAB — LIPOPROTEIN A (LPA): Lipoprotein (a): 115.7 nmol/L — ABNORMAL HIGH (ref <75.0)

## 2021-09-30 MED ORDER — AMIODARONE HCL 200 MG PO TABS
200.0000 mg | ORAL_TABLET | Freq: Two times a day (BID) | ORAL | Status: DC
  Administered 2021-09-30 (×2): 200 mg via ORAL
  Filled 2021-09-30 (×3): qty 1

## 2021-09-30 MED ORDER — TORSEMIDE 20 MG PO TABS
40.0000 mg | ORAL_TABLET | Freq: Every day | ORAL | Status: DC
  Administered 2021-09-30: 40 mg via ORAL
  Filled 2021-09-30 (×2): qty 2

## 2021-09-30 MED ORDER — POTASSIUM CHLORIDE CRYS ER 20 MEQ PO TBCR
20.0000 meq | EXTENDED_RELEASE_TABLET | Freq: Two times a day (BID) | ORAL | Status: DC
  Administered 2021-09-30 (×2): 20 meq via ORAL
  Filled 2021-09-30 (×2): qty 1

## 2021-09-30 NOTE — Discharge Summary (Signed)
[address]       [place] [address]             [phone]    Physician Discharge Summary  Patient ID: Luis [name] MRN: [id number] DOB/AGE: February 17, 1956 66 y.o.  Admit date: 09/28/2021 Discharge date: 10/01/2021  Admission Diagnoses:  Patient Active Problem List   Diagnosis Date Noted   Pericardial effusion 09/28/2021   Syncope    PAF (paroxysmal atrial fibrillation) ([place])    S/P AVR 09/16/2021   Morbid obesity ([place]) 02/29/2016   Essential hypertension    Ascending aortic aneurysm ([facility])    Rhinitis, chronic 01/14/2015   Generalized anxiety disorder 01/14/2015   Aortic stenosis, moderate 01/02/2015   Chronic diastolic CHF (congestive heart failure) ([place]) 06/30/2014   OSA (obstructive sleep apnea) 02/13/2012   Fatigue 01/09/2012   Gall bladder disease 06/16/2011   Hyperlipidemia 02/28/2011   Angina of effort ([place]) 12/08/2010   CAD (coronary artery disease) 12/08/2010   Tobacco abuse 12/08/2010   Aortic stenosis 12/08/2010   ANAL FISSURE 04/23/2009   INTERNAL HEMORRHOIDS 05/22/2008   COLONIC POLYPS 02/07/2008   CAROTID ARTERY STENOSIS 02/07/2008   Anxiety state 01/09/2007   Discharge Diagnoses:  Patient Active Problem List   Diagnosis Date Noted   S/P pericardial window creation 09/30/2021   Pericardial effusion 09/28/2021   Syncope    PAF (paroxysmal atrial fibrillation) ([place])    S/P AVR 09/16/2021   Morbid obesity ([place]) 02/29/2016   Essential hypertension    Ascending aortic aneurysm (H[facility])    Rhinitis, chronic 01/14/2015   Generalized anxiety disorder 01/14/2015   Aortic stenosis, moderate 01/02/2015   Chronic diastolic CHF (congestive heart failure) ([place]) 06/30/2014   OSA (obstructive sleep apnea) 02/13/2012   Fatigue 01/09/2012   Gall bladder disease 06/16/2011   Hyperlipidemia 02/28/2011   Angina of effort ([place]) 12/08/2010   CAD (coronary artery disease) 12/08/2010   Tobacco abuse 12/08/2010   Aortic stenosis 12/08/2010    ANAL FISSURE 04/23/2009   INTERNAL HEMORRHOIDS 05/22/2008   COLONIC POLYPS 02/07/2008   CAROTID ARTERY STENOSIS 02/07/2008   Anxiety state 01/09/2007   Discharged Condition: good  Hospital Course:  Luis [name] is a 66 yo male known to [facility].  He has a history of hypertension, hyperlipidemia, coronary artery disease status post PCI of the RCA in 2012 with DES,  bladder cancer that has been treated with TURBT and chemotherapy with BCG, and moderate aortic stenosis.  His most recent echo on 07/01/2021 showed an increase in the mean gradient to 48 mmHg with a peak gradient of 76 mmHg.  Valve area by VTI was 0.91 cm.  The valve appeared bicuspid.  The ascending aorta was measured at 4.5 cm.  Left ventricular ejection fraction was 60 to 65% with mild concentric LVH and grade 1 diastolic dysfunction.  Cardiac catheterization on 07/08/2021 showed patent stents in the RCA and otherwise no significant disease.  He was referred to [facility] for surgical consultation.  He was evaluated by Dr. Cyndia Bent at which time he continued to have exertional shortness of breath and fatigue.  He denies any chest pain or pressure.  He denies dizziness and syncope.  He has had no orthopnea or PND.  He denies peripheral edema.  His activity has primarily been limited by degenerative arthritis in his knees.  He had initially undergone workup for TAVR, however he was noted to have an enlarged Aorta on his CTA of the chest.  He underwent Aortic Valve Replacement and Replacement of Ascending Aorta on 09/16/2021.  His hospital course was complicated by Atrial Fibrillation with RVR.  He also required transfusions for anemia due to hematuria likely related to previous bladder cancer.  He also required IV diuretics for volume overloaded state.  The  patient was discharged home on 09/23/2021.  The  patient presented to the ED on 09/28/2021 via EMS after suffering a syncopal episode at home.  He was found unresponsive by  family members.  Workup in the ED showed a mildy elevated creatinine at 1.35, Hgb was stable at 8.0.  CT of the head was negative, but CTA of the chest showed a large low density pericardial effusion measuring nearly 4 cm.  Due to this Echocardiogram was performed which showed evidence of tamponade physiology.  Admission was recommended   Hospital Course:   Cardiology was consulted for admission.  They took patient to the catheterization lab for Pericardiocentesis.  Unfortunately they were unable to perform. Patient was then taken to the operating room for sub-xiphoid pericardial window creation.  This removed 850 ml of bloody fluid.  He tolerated the procedure without difficulty, was extubated and taken to the SICU in stable condition.  He was maintaining NSR.   The patient was resumed on home Coreg.  His Amiodarone was also resumed at a reduced dose.  The patient was started on diuretics for volume overloaded state.  He was started on iron therapy for his anemia.  His chest tube output decreased and his chest tube was removed on 09/30/2021.  He was stable for transfer to the progressive care unit.  The patient complained of numbness in his lower legs and bottom of his feet.  He was also complaining of nausea making it difficult for him to eat.  Due to this his Amiodarone was discontinued.  He remains on Coreg and Digoxin.  His weight is below baseline and he has been taken off Lasix and potassium.  He is ambulating without difficulty.  His surgical incisions are healing without evidence of infection.  He is medically stable for discharge home today.  Consults: cardiology  Significant Diagnostic Studies: cardiac graphics:    Echocardiogram:    IMPRESSIONS    1. Large pericardial effusion. Echo guided pericardiocentesis.  Pericardiocentesis was unsuccessful.   2. Left ventricular ejection fraction, by estimation, is 55 to 60%. The  left ventricle has normal function. Left ventricular endocardial  border  not optimally defined to evaluate regional wall motion.   3. Large pericardial effusion. The pericardial effusion is  circumferential.   Treatments: surgery:   09/28/2021   Surgeon:  Gaye Pollack, MD   First Assistant: Jadene Pierini, PA-C     Preoperative Diagnosis:  Large pericardial effusion   Postoperative Diagnosis:  Same   Procedure:  Subxyphoid pericardial window  Discharge Exam: Blood pressure (!) 115/53, pulse 75, temperature 98.3 F (36.8 C), temperature source Oral, resp. rate 20, height '5\' 6"'$  (1.676 m), weight 121.6 kg, SpO2 96 %. {physical [id number]   Discharge Medications:  The patient has been discharged on:   1.Beta Blocker:  Yes [ X  ]                              No   [   ]  If No, reason:  2.Ace Inhibitor/ARB: Yes [   ]                                     No  [  x  ]                                     If No, reason: hypotension  3.Statin:   Yes [ X  ]                  No  [   ]                  If No, reason:  4.Ecasa:  Yes  [ X  ]                  No   [   ]                  If No, reason:  Patient had ACS upon admission:NO  Plavix/P2Y12 inhibitor: Yes [   ]                                      No  [  N ]      Allergies as of 10/01/2021       Reactions   Oxycodone Other (See Comments)   Insomnia, "makes pt crazy".     Med Rec must be completed prior to using this [facility]***        Signed:  Ellwood Handler, PA-C  10/01/2021, 9:06 AM

## 2021-09-30 NOTE — Progress Notes (Signed)
09/30/2021 1020 Received pt to room 4E-23 from [place].  Pt is A&O, no C/O voiced.  Tele monitor applied and CCMD notified.  Oriented to room, call light and bed.  Call bell in reach, family at bedside. [place]

## 2021-09-30 NOTE — Progress Notes (Signed)
2 Days Post-Op Procedure(s) (LRB): SUBXYPHOID PERICARDIAL WINDOW (N/A) TRANSESOPHAGEAL ECHOCARDIOGRAM (TEE) (N/A) Subjective: No complaints. Ambulated this am.  Objective: Vital signs in last 24 hours: Temp:  [98.1 F (36.7 C)-98.4 F (36.9 C)] 98.2 F (36.8 C) (06/08 0301) Pulse Rate:  [59-71] 62 (06/08 0600) Cardiac Rhythm: Normal sinus rhythm (06/07 1600) Resp:  [15-18] 18 (06/07 1930) BP: (97-123)/(52-81) 120/58 (06/08 0600) SpO2:  [94 %-100 %] 97 % (06/08 0600) Weight:  [126.8 kg] 126.8 kg (06/08 0600)  Hemodynamic parameters for last 24 hours:    Intake/Output from previous day: 06/07 0701 - 06/08 0700 In: 8453 [P.O.:1570] Out: 1565 [Urine:1550; Chest Tube:15] Intake/Output this shift: No intake/output data recorded.  General appearance: alert and cooperative Neurologic: intact Heart: regular rate and rhythm, S1, S2 normal, no murmur Lungs: clear to auscultation bilaterally Extremities: edema mild Wound: dressing dry. Chest tube output low, serous.  Lab Results: Recent Labs    09/29/21 0314 09/30/21 0304  WBC 15.5* 16.3*  HGB 7.8* 7.9*  HCT 25.6* 25.7*  PLT 318 308   BMET:  Recent Labs    09/29/21 0314 09/30/21 0304  NA 136 135  K 4.2 3.8  CL 105 102  CO2 25 27  GLUCOSE 148* 118*  BUN 21 22  CREATININE 1.10 1.16  CALCIUM 7.9* 7.9*    PT/INR:  Recent Labs    09/28/21 0856  LABPROT 15.9*  INR 1.3*   ABG    Component Value Date/Time   PHART 7.351 09/16/2021 2204   HCO3 23.8 09/16/2021 2204   TCO2 25 09/16/2021 2204   ACIDBASEDEF 2.0 09/16/2021 2204   O2SAT 98 09/16/2021 2204   CBG (last 3)  Recent Labs    09/29/21 0330 09/29/21 0701 09/29/21 1133  GLUCAP 136* 124* 99   CXR: mild bibasilar atelectasis.  Assessment/Plan: S/P Procedure(s) (LRB): SUBXYPHOID PERICARDIAL WINDOW (N/A) TRANSESOPHAGEAL ECHOCARDIOGRAM (TEE) (N/A)  POD 2 Hemodynamically stable in sinus rhythm. Decrease amio to 200 bid. Continue digoxin and  Coreg.  DC pericardial tube and central line.  Continue diuresis. He is about 3 lbs over wt he went home at.and still has some edema.  Transfer to 4E. Continue IS, ambulation.   LOS: 2 days    Luis [name] 09/30/2021

## 2021-09-30 NOTE — Progress Notes (Signed)
Progress Note  Patient Name: Luis [name] Date of Encounter: 09/30/2021  [facility] Cardiologist: None   Subjective   Denies any chest pain, dyspnea, or lightheadedness  Inpatient Medications    Scheduled Meds:  acetaminophen  1,000 mg Oral Q6H   Or   acetaminophen (TYLENOL) oral liquid 160 mg/5 mL  1,000 mg Oral Q6H   amiodarone  400 mg Oral BID   aspirin EC  81 mg Oral BH-q7a   bisacodyl  10 mg Oral Daily   carvedilol  3.125 mg Oral BID WC   Chlorhexidine Gluconate Cloth  6 each Topical Daily   digoxin  0.25 mg Oral Daily   ferrous [id number] C-folic acid  1 capsule Oral BID PC   potassium chloride  20 mEq Oral BID   rosuvastatin  20 mg Oral Daily   senna-docusate  1 tablet Oral QHS   sodium chloride flush  3 mL Intravenous Q12H   tamsulosin  0.4 mg Oral Daily   torsemide  40 mg Oral Daily   venlafaxine XR  150 mg Oral Q breakfast   Continuous Infusions:  PRN Meds: acetaminophen, fentaNYL (SUBLIMAZE) injection, ondansetron (ZOFRAN) IV, sodium chloride flush, traMADol   Vital Signs    Vitals:   09/30/21 0301 09/30/21 0400 09/30/21 0500 09/30/21 0600  BP:  123/73 115/66 (!) 120/58  Pulse:  61 61 62  Resp:      Temp: 98.2 F (36.8 C)     TempSrc: Axillary     SpO2:  98% 97% 97%  Weight:    126.8 kg  Height:    '5\' 6"'$  (1.676 m)    Intake/Output Summary (Last 24 hours) at 09/30/2021 0808 Last data filed at 09/30/2021 0600 Gross per 24 hour  Intake 1570 ml  Output 1565 ml  Net 5 ml      09/30/2021    6:00 AM 09/29/2021    6:00 AM 09/23/2021    5:57 AM  Last 3 Weights  Weight (lbs) 279 lb 8.7 oz 278 lb 14.1 oz 277 lb 14.4 oz  Weight (kg) 126.8 kg 126.5 kg 126.055 kg      Telemetry    Normal sinus rhythm- Personally Reviewed  ECG    No new ECG- Personally Reviewed  Physical Exam   GEN: No acute distress.   Neck: No JVD Cardiac: RRR, no murmurs Respiratory: Scattered expiratory wheezing GI: Soft, nontender, non-distended  MS: No  edema; No deformity. Neuro:  Nonfocal  Psych: Normal affect   Labs    High Sensitivity Troponin:   Recent Labs  Lab 09/28/21 0228 09/28/21 0446  TROPONINIHS 46* 43*     Chemistry Recent Labs  Lab 09/28/21 0228 09/28/21 1752 09/28/21 2317 09/29/21 0314 09/30/21 0304  NA 135   < > 136 136 135  K 4.1   < > 4.6 4.2 3.8  CL 100   < > 102 105 102  CO2 23   < > '26 25 27  '$ GLUCOSE 179*   < > 159* 148* 118*  BUN 22   < > '22 21 22  '$ CREATININE 1.35*   < > 1.13 1.10 1.16  CALCIUM 8.1*   < > 7.9* 7.9* 7.9*  PROT 6.2*  --   --   --   --   ALBUMIN 2.8*  --   --   --   --   AST 69*  --   --   --   --   ALT 92*  --   --   --   --  ALKPHOS 124  --   --   --   --   BILITOT 1.0  --   --   --   --   GFRNONAA 58*   < > >60 >60 >60  ANIONGAP 12   < > '8 6 6   '$ < > = values in this interval not displayed.    Lipids No results for input(s): "CHOL", "TRIG", "HDL", "LABVLDL", "LDLCALC", "CHOLHDL" in the last 168 hours.  Hematology Recent Labs  Lab 09/28/21 2317 09/29/21 0314 09/30/21 0304  WBC 16.9* 15.5* 16.3*  RBC 2.80* 2.72* 2.72*  HGB 8.2* 7.8* 7.9*  HCT 26.2* 25.6* 25.7*  MCV 93.6 94.1 94.5  MCH 29.3 28.7 29.0  MCHC 31.3 30.5 30.7  RDW 15.1 15.1 15.5  PLT 324 318 308   Thyroid No results for input(s): "TSH", "FREET4" in the last 168 hours.  BNP Recent Labs  Lab 09/28/21 0228  BNP 171.0*    DDimer No results for input(s): "DDIMER" in the last 168 hours.   Radiology    DG Chest Port 1 View  Result Date: 09/29/2021 CLINICAL DATA:  Shortness of breath and chest pain. EXAM: PORTABLE CHEST 1 VIEW COMPARISON:  September 28, 2021 FINDINGS: The heart size and mediastinal contours are stable. Cardiac valvular replacement ring is unchanged. Heart size is enlarged. Right central venous line is unchanged compared prior exam. Patchy consolidation of left lung base is noted. The visualized skeletal structures are stable. IMPRESSION: Patchy consolidation of left lung base, suspicious for  pneumonia. Electronically Signed   By: Abelardo Diesel M.D.   On: 09/29/2021 07:22   DG Chest Port 1 View  Result Date: 09/28/2021 CLINICAL DATA:  Pericardial effusion. EXAM: PORTABLE CHEST 1 VIEW COMPARISON:  September 28, 2021 (2:33 a.m.) FINDINGS: Since the prior study there is been interval placement of a right internal jugular venous catheter. Its distal tip is seen overlying the midportion of the superior vena cava. Multiple sternal wires are noted. There is stable moderate to marked severity enlargement of the cardiac silhouette. An artificial aortic valve is in place. Mild atelectasis is seen within the left lung base. There is no evidence of a pleural effusion or pneumothorax. A radiopaque fusion plate and screws are seen overlying the cervical spine. The visualized skeletal structures are unremarkable. IMPRESSION: 1. Interval right internal jugular venous catheter placement, as described above. 2. Stable cardiomegaly with mild left basilar atelectasis. Electronically Signed   By: Virgina Norfolk M.D.   On: 09/28/2021 19:10   ECHOCARDIOGRAM LIMITED  Result Date: 09/28/2021    ECHOCARDIOGRAM LIMITED REPORT   Patient Name:   Luis [name] Date of Exam: 09/28/2021 Medical Rec #:  [id number]        Height:       66.0 in Accession #:    [id number]       Weight:       277.9 lb Date of Birth:  27-Sep-1955        BSA:          2.300 m Patient Age:    66 years         BP:           105/80 mmHg Patient Gender: M                HR:           70 bpm. Exam Location:  Inpatient Procedure: Limited Echo Indications:    Pericardiocenetsis  History:  Patient has prior history of Echocardiogram examinations, most                 recent 09/28/2021. CHF, CAD, Aortic Valve Disease; Risk                 Factors:Hypertension. Wortic valve disease. AVR.  Sonographer:    Clayton Lefort RDCS (AE) Referring Phys: [id number] Jettie Booze  Sonographer Comments: Image acquisition challenging due to patient body habitus. IMPRESSIONS  1.  Large pericardial effusion. Echo guided pericardiocentesis. Pericardiocentesis was unsuccessful.  2. Left ventricular ejection fraction, by estimation, is 55 to 60%. The left ventricle has normal function. Left ventricular endocardial border not optimally defined to evaluate regional wall motion.  3. Large pericardial effusion. The pericardial effusion is circumferential. FINDINGS  Left Ventricle: Left ventricular ejection fraction, by estimation, is 55 to 60%. The left ventricle has normal function. Left ventricular endocardial border not optimally defined to evaluate regional wall motion. Pericardium: A large pericardial effusion is present. The pericardial effusion is circumferential. Eleonore Chiquito MD Electronically signed by Eleonore Chiquito MD Signature Date/Time: 09/28/2021/3:26:52 PM    Final    CARDIAC CATHETERIZATION  Result Date: 09/28/2021   Unsuccessful attempt at pericardiocentesis. We will discuss with cardiac surgeons regarding possibility of pericardial window.    Cardiac Studies     Patient Profile     66 y.o. male with a hx of CAD s/p stenting to distal RCA, hypertension, hyperlipidemia, HFpEF, aortic stenosis, bladder cancer who is being seen for the evaluation of large pericardial effusion  Assessment & Plan    Pericardial effusion: had unwitnessed syncopal episode 6/6. CT angio with large pericardial effusion 4cm. Echo shows evidence of tamponade physiology. Plan for pericardiocentesis. Pericardiocentesis was unsuccessful.  Underwent pericardial window with Dr Cyndia Bent on 6/6.  Pericardial drain removed 6/8 -Plan limited echo tomorrow to evaluate for reaccumulation of effusion   Aortic stenosis s/p AVR, ascending aorta graft: done 5/25 by Dr. Cyndia Bent. Post op course overall uncomplicated  --Normal functioning valve on echo   Post op Afib: in SR on admission  -- continue amiodarone taper, now on '200mg'$  BID  -- continue coreg -- recommend discontinuing digoxin as remains in sinus  rhythm on amiodarone -- no OAC with pericardial effusion/hematuria noted during recent admission   CAD s/p stenting of RCA '12: no anginal symptoms -- continue ASA, statin, Coreg   HFpEF: post volume overload, has been on Lasix   Hx of bladder Ca: did have hematuria during recent admission. There was concern his cancer may have reoccurred. Now with pericardial effusion, question there this is contributing vs mostly post procedural issue. Will need cytologies of pericardial fluid  For questions or updates, please contact [place] Please consult [email or web address] for contact info under        Signed, Donato Heinz, MD  09/30/2021, 8:08 AM

## 2021-10-01 ENCOUNTER — Inpatient Hospital Stay (HOSPITAL_COMMUNITY): Payer: PPO

## 2021-10-01 DIAGNOSIS — R55 Syncope and collapse: Secondary | ICD-10-CM | POA: Diagnosis not present

## 2021-10-01 DIAGNOSIS — I3139 Other pericardial effusion (noninflammatory): Secondary | ICD-10-CM | POA: Diagnosis not present

## 2021-10-01 LAB — ECHOCARDIOGRAM LIMITED
Height: 66 in
S' Lateral: 3.2 cm
Weight: 4288 oz

## 2021-10-01 MED ORDER — DIGOXIN 125 MCG PO TABS
0.2500 mg | ORAL_TABLET | Freq: Every day | ORAL | Status: DC
  Administered 2021-10-01 – 2021-10-02 (×2): 0.25 mg via ORAL
  Filled 2021-10-01 (×2): qty 2

## 2021-10-01 MED ORDER — ONDANSETRON HCL 4 MG/2ML IJ SOLN: 4.0000 mg | Freq: Four times a day (QID) | INTRAMUSCULAR | Status: DC | PRN

## 2021-10-01 MED ORDER — POTASSIUM CHLORIDE CRYS ER 10 MEQ PO TBCR
30.0000 meq | EXTENDED_RELEASE_TABLET | Freq: Two times a day (BID) | ORAL | Status: DC
  Filled 2021-10-01: qty 1

## 2021-10-01 NOTE — Progress Notes (Signed)
  Transition of Care [facility]) Screening Note   Patient Details  Name: Luis [name] Date of Birth: 11/17/1955   Transition of Care [facility]) CM/SW Contact:    Dawayne Patricia, RN Phone Number: 10/01/2021, 4:21 PM    Transition of Care Department [facility]) has reviewed patient and no TOC needs have been identified at this time. We will continue to monitor patient advancement through interdisciplinary progression rounds. If new patient transition needs arise, please place a TOC consult.

## 2021-10-01 NOTE — Progress Notes (Signed)
  Echocardiogram 2D Echocardiogram has been performed.  Luis [name] 10/01/2021, 2:25 PM

## 2021-10-01 NOTE — Progress Notes (Addendum)
Progress Note  Patient Name: Luis [name] Date of Encounter: 10/01/2021  [facility] Cardiologist: None   Subjective   Patient had some nausea yesterday while walking. There was concern that this may be due to the Amiodarone. Therefore, this was stopped and he was restarted on Digoxin. He is maintaining sinus rhythm but having a lot of ectopy (looks like PACs given same morphology of regular QRS). He denies any nausea this morning. No chest pain or shortness of breath.  Inpatient Medications    Scheduled Meds:  acetaminophen  1,000 mg Oral Q6H   Or   acetaminophen (TYLENOL) oral liquid 160 mg/5 mL  1,000 mg Oral Q6H   aspirin EC  81 mg Oral BH-q7a   bisacodyl  10 mg Oral Daily   carvedilol  3.125 mg Oral BID WC   digoxin  0.25 mg Oral Daily   ferrous [id number] C-folic acid  1 capsule Oral BID PC   rosuvastatin  20 mg Oral Daily   senna-docusate  1 tablet Oral QHS   tamsulosin  0.4 mg Oral Daily   venlafaxine XR  150 mg Oral Q breakfast   Continuous Infusions:  PRN Meds: ondansetron (ZOFRAN) IV, traMADol   Vital Signs    Vitals:   09/30/21 2241 10/01/21 0021 10/01/21 0418 10/01/21 0803  BP:  126/65 127/62 (!) 115/53  Pulse: 67 65 73 75  Resp: '20 19 20 20  '$ Temp:  98.5 F (36.9 C) 98.3 F (36.8 C) 98.3 F (36.8 C)  TempSrc:  Oral Oral Oral  SpO2: 98% 98% 95% 96%  Weight:   121.6 kg   Height:        Intake/Output Summary (Last 24 hours) at 10/01/2021 1103 Last data filed at 10/01/2021 0803 Gross per 24 hour  Intake 620 ml  Output 1755 ml  Net -1135 ml      10/01/2021    4:18 AM 09/30/2021    6:00 AM 09/29/2021    6:00 AM  Last 3 Weights  Weight (lbs) 268 lb 279 lb 8.7 oz 278 lb 14.1 oz  Weight (kg) 121.564 kg 126.8 kg 126.5 kg      Telemetry    Normal sinus rhythm with PACs. Rates in the 66s to 57s. - Personally Reviewed  ECG    Normal sinus rhythm, rate 74 bpm, with 1st degree AV block and PACs. Non-specific intra-ventricular conduction  block. - Personally Reviewed  Physical Exam   GEN: No acute distress.   Neck: No JVD. Cardiac: RRR. No murmurs, rubs, or gallops.  Respiratory: Clear to auscultation bilaterally. No wheezes, rhonchi, or rales. GI: Soft, non-distended, and non-tender. MS: Trace lower extremity edema. No deformity. Skin: Warm and dry. Neuro:  No focal deficits. Psych: Normal affect. Responds appropriately.   Labs    High Sensitivity Troponin:   Recent Labs  Lab 09/28/21 0228 09/28/21 0446  TROPONINIHS 46* 43*     Chemistry Recent Labs  Lab 09/28/21 0228 09/28/21 1752 09/28/21 2317 09/29/21 0314 09/30/21 0304  NA 135   < > 136 136 135  K 4.1   < > 4.6 4.2 3.8  CL 100   < > 102 105 102  CO2 23   < > '26 25 27  '$ GLUCOSE 179*   < > 159* 148* 118*  BUN 22   < > '22 21 22  '$ CREATININE 1.35*   < > 1.13 1.10 1.16  CALCIUM 8.1*   < > 7.9* 7.9* 7.9*  PROT 6.2*  --   --   --   --  ALBUMIN 2.8*  --   --   --   --   AST 69*  --   --   --   --   ALT 92*  --   --   --   --   ALKPHOS 124  --   --   --   --   BILITOT 1.0  --   --   --   --   GFRNONAA 58*   < > >60 >60 >60  ANIONGAP 12   < > '8 6 6   '$ < > = values in this interval not displayed.    Lipids No results for input(s): "CHOL", "TRIG", "HDL", "LABVLDL", "LDLCALC", "CHOLHDL" in the last 168 hours.  Hematology Recent Labs  Lab 09/28/21 2317 09/29/21 0314 09/30/21 0304  WBC 16.9* 15.5* 16.3*  RBC 2.80* 2.72* 2.72*  HGB 8.2* 7.8* 7.9*  HCT 26.2* 25.6* 25.7*  MCV 93.6 94.1 94.5  MCH 29.3 28.7 29.0  MCHC 31.3 30.5 30.7  RDW 15.1 15.1 15.5  PLT 324 318 308   Thyroid No results for input(s): "TSH", "FREET4" in the last 168 hours.  BNP Recent Labs  Lab 09/28/21 0228  BNP 171.0*    DDimer No results for input(s): "DDIMER" in the last 168 hours.   Radiology    DG CHEST PORT 1 VIEW  Result Date: 09/30/2021 CLINICAL DATA:  Chest tube. EXAM: PORTABLE CHEST 1 VIEW COMPARISON:  09/29/2021 FINDINGS: 0527 hours. Low volume film. The  cardio pericardial silhouette is enlarged. Left base collapse/consolidation is similar to prior the tiny left pleural effusion. No evidence for pneumothorax. Right IJ central line tip overlies the region of the innominate vein confluence. Telemetry leads overlie the chest. IMPRESSION: Stable exam. Left base collapse/consolidation with tiny left pleural effusion. Electronically Signed   By: Misty Stanley M.D.   On: 09/30/2021 08:12    Cardiac Studies   Complete Echo 09/28/2021: Impressions: 1. Large pericardial effusion. Measures 3.1cm adjacent to RV. There is  evidence of tamponade physiology, with RV diastolic collapse seen.   2. Left ventricular ejection fraction, by estimation, is 60 to 65%. The  left ventricle has normal function. The left ventricle has no regional  wall motion abnormalities. There is moderate left ventricular hypertrophy.  Left ventricular diastolic  parameters are indeterminate.   3. Right ventricular systolic function is normal. The right ventricular  size is normal. Tricuspid regurgitation signal is inadequate for assessing  PA pressure.   4. The mitral valve is normal in structure. No evidence of mitral valve  regurgitation.   5. There is a 25 mm Edwards Inspiris valve present in the aortic position      Aortic valve regurgitation is not visualized. Echo findings are  consistent with normal structure and function of the aortic valve  prosthesis. Aortic valve mean gradient measures 10.0 mmHg.   6. The inferior vena cava is dilated in size with <50% respiratory  variability, suggesting right atrial pressure of 15 mmHg.  _______________  Pericardiocentesis 09/28/2021:   Unsuccessful attempt at pericardiocentesis.   We will discuss with cardiac surgeons regarding possibility of pericardial window.  _______________  Limited Echo 09/28/2021: Impressions: 1. Large pericardial effusion. Echo guided pericardiocentesis.  Pericardiocentesis was unsuccessful.   2. Left  ventricular ejection fraction, by estimation, is 55 to 60%. The  left ventricle has normal function. Left ventricular endocardial border  not optimally defined to evaluate regional wall motion.   3. Large pericardial effusion. The pericardial effusion is  circumferential.  Patient Profile     66 y.o. male with a history of CAD s/p DES to distal RCA in 2012, HFppEF, severe aortic stenosis s/p recent AVR, hypertension, hyperlipidemia, and bladder cancer. He recently underwent AVR and replacement of ascending aorta on 09/16/2021 and was discharged on 09/23/2021. He then presented back to the ED on 09/28/2021 after a syncopal episode and was found to have a large pericardial effusion with evidence of tamponade.   Assessment & Plan    Syncope Pericardial Effusion with Cardiac Tamponade Patient had an unwitnessed syncopal episode on 09/28/2021. CTA showed large pericardial effusion measuring 4cm. Echo showed evidence of tamponade physiology. Pericardiocentesis was attempted on 6/6 but was unsuccessful. Therefore, he underwent pericardial window with Dr. Cyndia Bent. Drain was removed on 6/8. - Patient doing well today. - Will repeat limited Echo today to re-evaluate for reaccumulation.   Aortic Stenosis s/p AVR and Ascending Aorta Graft Recent AVR and ascending aorta graft on 09/16/2021.  - Normal functioning valve on Echo this admission.  CAD S/p DES to RCA in 2012. - No angina symptoms. - Continue Aspirin '81mg'$  daily, Coreg 3.'125mg'$  twice daily, and Crestor '20mg'$  daily.  Post-Op Atrial Fibrillation Following recent valve surgery.  - Maintaining sinus rhythm this admission. Telemetry does show frequent PACs. - Amiodarone was stopped yesterday due to concern that this was causing patient's nausea and he was restarted on Digoxin.  I would be more concerned about the digoxin as the cause of his GI symptoms and would recommend discontinuing.  As he is maintaining sinus rhythm and has normal systolic function,  would not recommend continuing digoxin.  Amiodarone could be contributing but notably his dose was cut from 400 mg twice daily to 200 mg twice daily yesterday - Continue Coreg 3.'125mg'$  twice daily.  HFpEF He had post-op volume overload and has been on Torsemide. Echo this admission showed LVEF of 55-60%. - Appear euvolemic on exam. - CT surgery team stopped Torsemide today due to weight being lower than baseline.  Hypertension BP well controlled. - Continue Coreg as above.  History of Bladder Cancer He did have recurrent hematuria during recent admission and there concern that he may have recurrent bladder cancer. He already has follow-up with Urology planned.    For questions or updates, please contact [place] Please consult [email or web address] for contact info under        Signed, Darreld Mclean, PA-C  10/01/2021, 11:03 AM    Patient seen and examined.  Agree with above documentation.  On exam, patient is alert and oriented, regular rate and rhythm, no murmurs, lungs CTAB, no LE edema or JVD.  Would favor discontinuing digoxin.  Plan limited echo today to evaluate for reaccumulation of pericardial fluid  Donato Heinz, MD

## 2021-10-01 NOTE — Care Management Important Message (Signed)
Important Message  Patient Details  Name: Luis [name] MRN: [id number] Date of Birth: January 12, 1956   Medicare Important Message Given:  Yes     Shelda Altes 10/01/2021, 9:21 AM

## 2021-10-01 NOTE — Plan of Care (Addendum)
Plan of care is reviewed. Pt is progressing.   Problem: Activity: Pt has complaints of feeling dizziness while ambulated with PT. Goal: Ability to return to baseline activity level will improve Outcome: Progressing: orthostatic BP was checked. No orthostatic hypotension noted.    Problem: Cardiovascular: Pt is known case of s/p AVR on 09/16/21 with post op Atrial fibrillation. He had developed cardiac tamponade from large pericardial effusion after discharged home on 09/23/21. Pt underwent sub-xyphoid pericardial window by Dr. Cyndia Bent on 09/28/2021. Goal: Ability to achieve and maintain adequate cardiovascular perfusion will improve Outcome: Progressing: Pt is hemodynamically stable. BP is within normal limits. No acute distress noted tonight. NSR on the monitor.    Problem: Clinical Measurements:  Goal: Will remain free from infection Outcome: Progressing: Pt remains afebrile. Incision site is clean and dry, no drainage, no signs of surgical wound infection.   Problem: Clinical Measurements: Dx OSA, on CPAP at night. CXR on 09/30/21 showed left base collapse/consolidation with tiny left pleural effusion. Goal: Respiratory complications will improve Outcome: Progressing: SPO2 remains > 97% on room air/ CPAP. He denied SOB. Lungs clear on auscultation. Encouraged using incentive spirometer effectively. Pt expressed understanding.    Problem: Activity: Pt had a complaint of dizziness, which is caused to have poor tolerated on ambulation. No SOB with exertion.  Goal: Risk for activity intolerance will decrease Outcome: Progressing: We will try advanced ambulation as tolerated with mobility specialist again at am.    Problem: Pain Managment: post-op as mentioned above. Goal: General experience of comfort will improve Outcome: Progressing: Pt denies pain. Tylenol schedule q 6 hrs. He is able to rest well at night.   Kennyth Lose, RN

## 2021-10-01 NOTE — Progress Notes (Addendum)
      [address]       [place] [address]             [phone]      3 Days Post-Op Procedure(s) (LRB): SUBXYPHOID PERICARDIAL WINDOW (N/A) TRANSESOPHAGEAL ECHOCARDIOGRAM (TEE) (N/A)  Subjective:  Patient with multiple complaints.  He states that he is nauseated and hasn't been able to eat much.  He also states last night he experienced flushing and when he attempted to walk he felt like he was going to pass out.  He also states that his legs and bottom of his feet are numb.  Objective: Vital signs in last 24 hours: Temp:  [97.8 F (36.6 C)-98.5 F (36.9 C)] 98.3 F (36.8 C) (06/09 0418) Pulse Rate:  [64-73] 73 (06/09 0418) Cardiac Rhythm: Normal sinus rhythm;Bundle branch block (06/08 1908) Resp:  [18-20] 20 (06/09 0418) BP: (97-130)/(52-70) 127/62 (06/09 0418) SpO2:  [92 %-100 %] 95 % (06/09 0418) Weight:  [121.6 kg] 121.6 kg (06/09 0418)  Intake/Output from previous day: 06/08 0701 - 06/09 0700 In: 500 [P.O.:500] Out: 1655 [Urine:1655]  General appearance: alert, cooperative, and no distress Heart: regular rate and rhythm Lungs: clear to auscultation bilaterally Abdomen: soft, non-tender; bowel sounds normal; no masses,  no organomegaly Extremities: edema +1  Wound: clean and dry  Lab Results: Recent Labs    09/29/21 0314 09/30/21 0304  WBC 15.5* 16.3*  HGB 7.8* 7.9*  HCT 25.6* 25.7*  PLT 318 308   BMET:  Recent Labs    09/29/21 0314 09/30/21 0304  NA 136 135  K 4.2 3.8  CL 105 102  CO2 25 27  GLUCOSE 148* 118*  BUN 21 22  CREATININE 1.10 1.16  CALCIUM 7.9* 7.9*    PT/INR:  Recent Labs    09/28/21 0856  LABPROT 15.9*  INR 1.3*   ABG    Component Value Date/Time   PHART 7.351 09/16/2021 2204   HCO3 23.8 09/16/2021 2204   TCO2 25 09/16/2021 2204   ACIDBASEDEF 2.0 09/16/2021 2204   O2SAT 98 09/16/2021 2204   CBG (last 3)  Recent Labs    09/29/21 0330 09/29/21 0701 09/29/21 1133  GLUCAP 136* 124* 99     Assessment/Plan: S/P Procedure(s) (LRB): SUBXYPHOID PERICARDIAL WINDOW (N/A) TRANSESOPHAGEAL ECHOCARDIOGRAM (TEE) (N/A)  CV- NSR with PVCs BP is well controlled- on Coreg at 3.125, also on Amiodarone 200 mg BID.[name] this may be a source of most of his complaints, he was also on Digoxin, which I will resume Pulm- no acute issues, continue IS Renal- creatinine remains stable at 1.16, weight is elevated with edema on exam continue Demadex.. K is at 3.8 continue supplement GI- nausea, he is moving bowels, patient had no difficulty with oral intake during previous admission, this is likely related to Amiodarone use.. will add zofran prn Morbid obesity Deconditioning- patient encouraged to continue and try to ambulate Dispo- patient stable, in NSR with PVCs, all his complaints are side effects of Amiodarone use, will resume Digoxin, will add zofran prn for nausea relief   LOS: 3 days   Ellwood Handler, PA-C 10/01/2021   Chart reviewed, patient examined, agree with above. He has been hemodynamically stable in sinus rhythm. With his persistent nausea complaints, some numbness in feet will stop amiodarone to see if this helps. Continue Coreg and digoxin. Wt is 268 today which is lower than his baseline wt so will stop diuretic and KCL.

## 2021-10-02 DIAGNOSIS — I3139 Other pericardial effusion (noninflammatory): Secondary | ICD-10-CM | POA: Diagnosis not present

## 2021-10-02 DIAGNOSIS — I480 Paroxysmal atrial fibrillation: Secondary | ICD-10-CM | POA: Diagnosis not present

## 2021-10-02 LAB — TYPE AND SCREEN
ABO/RH(D): A NEG
Antibody Screen: NEGATIVE
Unit division: 0
Unit division: 0

## 2021-10-02 LAB — BPAM RBC
Blood Product Expiration Date: 202306152359
Blood Product Expiration Date: 202306152359
ISSUE DATE / TIME: 202306061636
ISSUE DATE / TIME: 202306061636
Unit Type and Rh: 600
Unit Type and Rh: 600

## 2021-10-02 MED ORDER — FUROSEMIDE 40 MG PO TABS
40.0000 mg | ORAL_TABLET | Freq: Every day | ORAL | Status: DC
  Administered 2021-10-02 – 2021-10-03 (×2): 40 mg via ORAL
  Filled 2021-10-02 (×2): qty 1

## 2021-10-02 NOTE — Progress Notes (Addendum)
      [address]       ,[place] [address]             [phone]        4 Days Post-Op Procedure(s) (LRB): SUBXYPHOID PERICARDIAL WINDOW (N/A) TRANSESOPHAGEAL ECHOCARDIOGRAM (TEE) (N/A)  Subjective: Patient states legs/bottom of feet still are numb. He walked 3 times yesterday. He has intermittent nausea. He is asking when the can go home.  Objective: Vital signs in last 24 hours: Temp:  [97.7 F (36.5 C)-98.4 F (36.9 C)] 97.7 F (36.5 C) (06/10 0819) Pulse Rate:  [62-76] 70 (06/10 0819) Cardiac Rhythm: Normal sinus rhythm (06/09 1907) Resp:  [17-20] 17 (06/10 0819) BP: (98-138)/(53-71) 102/66 (06/10 0819) SpO2:  [91 %-99 %] 91 % (06/10 0819) Weight:  [125.1 kg] 125.1 kg (06/10 0644)   Current Weight  10/02/21 125.1 kg      Intake/Output from previous day: 06/09 0701 - 06/10 0700 In: 360 [P.O.:360] Out: 300 [Urine:300]   Physical Exam:  Cardiovascular: RRR Pulmonary: Clear to auscultation bilaterally Abdomen: Soft, obese,non tender, bowel sounds present. Extremities: Mild bilateral lower extremity edema. Wounds: Lower sternal wound with slight bloody drainage.  No erythema or signs of infection. Left chest tube wound with slight sero sanguinous drainage  Lab Results: CBC: Recent Labs    09/30/21 0304  WBC 16.3*  HGB 7.9*  HCT 25.7*  PLT 308   BMET:  Recent Labs    09/30/21 0304  NA 135  K 3.8  CL 102  CO2 27  GLUCOSE 118*  BUN 22  CREATININE 1.16  CALCIUM 7.9*    PT/INR:  Lab Results  Component Value Date   INR 1.3 (H) 09/28/2021   INR 1.4 (H) 09/22/2021   INR 1.2 09/21/2021   ABG:  INR: Will add last result for INR, ABG once components are confirmed Will add last 4 CBG results once components are confirmed  Assessment/Plan:  1. CV - SR,PVCs previously. History of a fib. On Coreg 3.125 mg bid and Digoxin 0.25 mg daily.  2.  Pulmonary - On room air. Encourage incentive spirometer. 3.  Expected post op acute  blood loss anemia - Last H and H stable 7.9 and 25.7. Continue Trinsicon 4.Will discuss disposition with Dr. Nada Libman ZimmermanPA-C 8:25 AM   Patient seen and examined, agree with above Says nausea a little better Still numbness in feet Home tomorrow if continues to progress  Remo Lipps C. Roxan Hockey, MD [facility] [phone]

## 2021-10-02 NOTE — Progress Notes (Addendum)
Progress Note  Patient Name: Luis [name] Date of Encounter: 10/02/2021  [facility] Cardiologist:  Subjective   Denies dyspnea or CP  Inpatient Medications    Scheduled Meds:  acetaminophen  1,000 mg Oral Q6H   Or   acetaminophen (TYLENOL) oral liquid 160 mg/5 mL  1,000 mg Oral Q6H   aspirin EC  81 mg Oral BH-q7a   bisacodyl  10 mg Oral Daily   carvedilol  3.125 mg Oral BID WC   ferrous [id number] C-folic acid  1 capsule Oral BID PC   rosuvastatin  20 mg Oral Daily   senna-docusate  1 tablet Oral QHS   tamsulosin  0.4 mg Oral Daily   venlafaxine XR  150 mg Oral Q breakfast   Continuous Infusions:  PRN Meds: ondansetron (ZOFRAN) IV, traMADol   Vital Signs    Vitals:   10/01/21 2347 10/02/21 0437 10/02/21 0644 10/02/21 0819  BP: 116/66 128/60  102/66  Pulse: 62 76  70  Resp: '18 17  17  '$ Temp: 98.4 F (36.9 C) 98.4 F (36.9 C)  97.7 F (36.5 C)  TempSrc: Oral Oral  Oral  SpO2: 96% 96%  91%  Weight:   125.1 kg   Height:        Intake/Output Summary (Last 24 hours) at 10/02/2021 0943 Last data filed at 10/02/2021 3016 Gross per 24 hour  Intake 240 ml  Output 200 ml  Net 40 ml      10/02/2021    6:44 AM 10/01/2021    4:18 AM 09/30/2021    6:00 AM  Last 3 Weights  Weight (lbs) 275 lb 12.7 oz 268 lb 279 lb 8.7 oz  Weight (kg) 125.1 kg 121.564 kg 126.8 kg      Telemetry    Sinus with PACs - Personally Reviewed   GEN: No acute distress.   Neck: No JVD Cardiac: RRR, 2/6 systolic murmur Respiratory: diminished BS bases GI: Soft, nontender, non-distended  MS: 1+ edema Neuro:  Nonfocal  Psych: Normal affect   Labs    High Sensitivity Troponin:   Recent Labs  Lab 09/28/21 0228 09/28/21 0446  TROPONINIHS 46* 43*     Chemistry Recent Labs  Lab 09/28/21 0228 09/28/21 1752 09/28/21 2317 09/29/21 0314 09/30/21 0304  NA 135   < > 136 136 135  K 4.1   < > 4.6 4.2 3.8  CL 100   < > 102 105 102  CO2 23   < > '26 25 27  '$ GLUCOSE  179*   < > 159* 148* 118*  BUN 22   < > '22 21 22  '$ CREATININE 1.35*   < > 1.13 1.10 1.16  CALCIUM 8.1*   < > 7.9* 7.9* 7.9*  PROT 6.2*  --   --   --   --   ALBUMIN 2.8*  --   --   --   --   AST 69*  --   --   --   --   ALT 92*  --   --   --   --   ALKPHOS 124  --   --   --   --   BILITOT 1.0  --   --   --   --   GFRNONAA 58*   < > >60 >60 >60  ANIONGAP 12   < > '8 6 6   '$ < > = values in this interval not displayed.    Hematology Recent Labs  Lab  09/28/21 2317 09/29/21 0314 09/30/21 0304  WBC 16.9* 15.5* 16.3*  RBC 2.80* 2.72* 2.72*  HGB 8.2* 7.8* 7.9*  HCT 26.2* 25.6* 25.7*  MCV 93.6 94.1 94.5  MCH 29.3 28.7 29.0  MCHC 31.3 30.5 30.7  RDW 15.1 15.1 15.5  PLT 324 318 308   BNP Recent Labs  Lab 09/28/21 0228  BNP 171.0*     Radiology    ECHOCARDIOGRAM LIMITED  Result Date: 10/01/2021    ECHOCARDIOGRAM LIMITED REPORT   Patient Name:   Luis [name] Date of Exam: 10/01/2021 Medical Rec #:  [id number]        Height:       66.0 in Accession #:    [id number]       Weight:       268.0 lb Date of Birth:  Oct 30, 1955        BSA:          2.265 m Patient Age:    1 years         BP:           124/56 mmHg Patient Gender: M                HR:           69 bpm. Exam Location:  Inpatient Procedure: Limited Echo Indications:    Pericardial effusion  History:        Patient has prior history of Echocardiogram examinations, most                 recent 09/28/2021. CHF, CAD, Aortic Valve Disease; Risk                 Factors:Sleep Apnea, Dyslipidemia and Hypertension.  Sonographer:    Eartha Inch Referring Phys: [id number] [place]  Sonographer Comments: Some views difficult to image due to bandage placement. IMPRESSIONS  1. Left ventricular ejection fraction, by estimation, is 55%. The left ventricle has normal function. Left ventricular endocardial border not optimally defined to evaluate regional wall motion.  2. Septal bounce consistent with recent cardiac surgery.  3. The RV was  poorly visualized.  4. Bioprosthetic aortic valve, not fully evaluated.  5. A small pericardial effusion is present. The pericardial effusion is circumferential. There is no evidence of cardiac tamponade. FINDINGS  Left Ventricle: Left ventricular ejection fraction, by estimation, is 55%. The left ventricle has normal function. Left ventricular endocardial border not optimally defined to evaluate regional wall motion. The left ventricular internal cavity size was normal in size. There is no left ventricular hypertrophy. Pericardium: A small pericardial effusion is present. The pericardial effusion is circumferential. There is no evidence of cardiac tamponade. LEFT VENTRICLE PLAX 2D LVIDd:         5.00 cm LVIDs:         3.20 cm LV PW:         1.10 cm LV IVS:        1.10 cm  Dalton McleanMD Electronically signed by Franki Monte Signature Date/Time: 10/01/2021/5:32:28 PM    Final       Patient Profile   66 y.o. male with a history of CAD s/p DES to distal RCA in 2012, HFppEF, severe aortic stenosis s/p recent AVR, hypertension, hyperlipidemia, and bladder cancer. He recently underwent AVR and replacement of ascending aorta on 09/16/2021 and was discharged on 09/23/2021. He then presented back to the ED on 09/28/2021 after a syncopal episode and was found to have a large pericardial effusion  with evidence of tamponade.  He is now status post pericardial window.  Follow-up echocardiogram yesterday showed normal LV function and small pericardial effusion.  Assessment & Plan    1 pericardial effusion-status post pericardial window.  Follow-up echocardiogram yesterday showed small residual effusion.  2 Status post AVR ascending aortic graft-recent echocardiogram showed normal functioning valve.  3 postoperative atrial fibrillation-patient remains in sinus rhythm today.  Amiodarone was discontinued due to concern it was causing nausea.  If atrial fibrillation recurs could consider low-dose amiodarone (200 mg daily)  to see if he tolerates compared to higher dose.  We will continue carvedilol.  Discontinue digoxin.  4 coronary artery disease-continue aspirin and statin.  5 history of bladder cancer-follow-up shows question abnormality in the bladder.  Follow-up with urology as patient.  6 history of diastolic congestive heart failure-mildly volume overloaded on examination.  Resume Lasix 40 mg daily.  For questions or updates, please contact [place] Please consult [email or web address] for contact info under        Signed, Kirk Ruths, MD  10/02/2021, 9:43 AM

## 2021-10-03 MED ORDER — TRAMADOL HCL 50 MG PO TABS: 50.0000 mg | ORAL_TABLET | Freq: Four times a day (QID) | ORAL | 0 refills | Status: DC | PRN

## 2021-10-03 NOTE — Progress Notes (Addendum)
      [address]       [place] [address]             [phone]        5 Days Post-Op Procedure(s) (LRB): SUBXYPHOID PERICARDIAL WINDOW (N/A) TRANSESOPHAGEAL ECHOCARDIOGRAM (TEE) (N/A)  Subjective: Patient states legs/bottom of feet still are numb and still with swollen feet. He states overall, however, he is feeling better and wants to go home.   Objective: Vital signs in last 24 hours: Temp:  [97.8 F (36.6 C)-98.4 F (36.9 C)] 97.9 F (36.6 C) (06/11 0833) Pulse Rate:  [71-74] 72 (06/11 0833) Cardiac Rhythm: Normal sinus rhythm;Bundle branch block;Heart block (06/10 1900) Resp:  [17-20] 19 (06/11 0833) BP: (98-125)/(41-73) 116/67 (06/11 0833) SpO2:  [96 %-100 %] 100 % (06/11 0833) Weight:  [125.6 kg] 125.6 kg (06/11 0644)   Current Weight  10/03/21 125.6 kg      Intake/Output from previous day: 06/10 0701 - 06/11 0700 In: -  Out: 250 [Urine:250]   Physical Exam:  Cardiovascular: RRR Pulmonary: Clear to auscultation bilaterally Abdomen: Soft, obese,non tender, bowel sounds present. Extremities: Mild bilateral lower extremity/feet edema. Wounds: Lower sternal wound with scant bloody drainage.  No erythema or signs of infection. Left chest tube wound clean and dry this am  Lab Results: CBC: No results for input(s): "WBC", "HGB", "HCT", "PLT" in the last 72 hours.  BMET:  No results for input(s): "NA", "K", "CL", "CO2", "GLUCOSE", "BUN", "CREATININE", "CALCIUM" in the last 72 hours.   PT/INR:  Lab Results  Component Value Date   INR 1.3 (H) 09/28/2021   INR 1.4 (H) 09/22/2021   INR 1.2 09/21/2021   ABG:  INR: Will add last result for INR, ABG once components are confirmed Will add last 4 CBG results once components are confirmed  Assessment/Plan:  1. CV - SR, first degree heart block. History of a fib. On Coreg 3.125 mg bid. Cardiology stopped Digoxin yesterday 2.  Pulmonary - On room air. Encourage incentive spirometer. 3.   Expected post op acute blood loss anemia - Last H and H stable 7.9 and 25.7. Continue Trinsicon 4.Will discharge  Donielle M ZimmermanPA-C 8:55 AM   Patient seen and examined, agree with above Home today  Remo Lipps C. Roxan Hockey, MD [facility] [phone]

## 2021-10-05 ENCOUNTER — Telehealth (HOSPITAL_COMMUNITY): Payer: Self-pay

## 2021-10-05 NOTE — Telephone Encounter (Signed)
Pt wife Edd Fabian returned CR phone and stated pt is not interested in CR at this time.   Closed referral

## 2021-10-05 NOTE — Telephone Encounter (Signed)
Attempted to call patient in regards to Cardiac Rehab - LM on VM 

## 2021-10-06 ENCOUNTER — Ambulatory Visit (HOSPITAL_COMMUNITY)
Admission: RE | Admit: 2021-10-06 | Discharge: 2021-10-06 | Disposition: A | Discharge status: Home / Self Care | Payer: PPO | Source: Ambulatory Visit | Attending: Family Medicine | Admitting: Family Medicine

## 2021-10-06 ENCOUNTER — Encounter (HOSPITAL_COMMUNITY): Payer: Self-pay

## 2021-10-06 VITALS — BP 120/70 | HR 72 | Wt 275.6 lb

## 2021-10-06 DIAGNOSIS — I10 Essential (primary) hypertension: Secondary | ICD-10-CM | POA: Diagnosis not present

## 2021-10-06 DIAGNOSIS — E785 Hyperlipidemia, unspecified: Secondary | ICD-10-CM | POA: Insufficient documentation

## 2021-10-06 DIAGNOSIS — I25119 Atherosclerotic heart disease of native coronary artery with unspecified angina pectoris: Secondary | ICD-10-CM | POA: Diagnosis not present

## 2021-10-06 DIAGNOSIS — R519 Headache, unspecified: Secondary | ICD-10-CM | POA: Insufficient documentation

## 2021-10-06 DIAGNOSIS — I110 Hypertensive heart disease with heart failure: Secondary | ICD-10-CM | POA: Diagnosis not present

## 2021-10-06 DIAGNOSIS — Z952 Presence of prosthetic heart valve: Secondary | ICD-10-CM

## 2021-10-06 DIAGNOSIS — G4733 Obstructive sleep apnea (adult) (pediatric): Secondary | ICD-10-CM | POA: Diagnosis not present

## 2021-10-06 DIAGNOSIS — I7121 Aneurysm of the ascending aorta, without rupture: Secondary | ICD-10-CM | POA: Diagnosis not present

## 2021-10-06 DIAGNOSIS — I350 Nonrheumatic aortic (valve) stenosis: Secondary | ICD-10-CM | POA: Insufficient documentation

## 2021-10-06 DIAGNOSIS — R5381 Other malaise: Secondary | ICD-10-CM | POA: Diagnosis not present

## 2021-10-06 DIAGNOSIS — I2510 Atherosclerotic heart disease of native coronary artery without angina pectoris: Secondary | ICD-10-CM | POA: Diagnosis not present

## 2021-10-06 DIAGNOSIS — I5032 Chronic diastolic (congestive) heart failure: Secondary | ICD-10-CM | POA: Insufficient documentation

## 2021-10-06 DIAGNOSIS — I3139 Other pericardial effusion (noninflammatory): Secondary | ICD-10-CM | POA: Insufficient documentation

## 2021-10-06 DIAGNOSIS — Z955 Presence of coronary angioplasty implant and graft: Secondary | ICD-10-CM | POA: Diagnosis not present

## 2021-10-06 LAB — CBC
HCT: 28 % — ABNORMAL LOW (ref 39.0–52.0)
Hemoglobin: 8.3 g/dL — ABNORMAL LOW (ref 13.0–17.0)
MCH: 27.4 pg (ref 26.0–34.0)
MCHC: 29.6 g/dL — ABNORMAL LOW (ref 30.0–36.0)
MCV: 92.4 fL (ref 80.0–100.0)
Platelets: 280 10*3/uL (ref 150–400)
RBC: 3.03 MIL/uL — ABNORMAL LOW (ref 4.22–5.81)
RDW: 15.2 % (ref 11.5–15.5)
WBC: 6.3 10*3/uL (ref 4.0–10.5)
nRBC: 0 % (ref 0.0–0.2)

## 2021-10-06 LAB — BASIC METABOLIC PANEL
Anion gap: 9 (ref 5–15)
BUN: 11 mg/dL (ref 8–23)
CO2: 27 mmol/L (ref 22–32)
Calcium: 8.5 mg/dL — ABNORMAL LOW (ref 8.9–10.3)
Chloride: 104 mmol/L (ref 98–111)
Creatinine, Ser: 1.05 mg/dL (ref 0.61–1.24)
GFR, Estimated: >60 mL/min (ref >60)
Glucose, Bld: 130 mg/dL — ABNORMAL HIGH (ref 70–99)
Potassium: 4.1 mmol/L (ref 3.5–5.1)
Sodium: 140 mmol/L (ref 135–145)

## 2021-10-06 LAB — BRAIN NATRIURETIC PEPTIDE: B Natriuretic Peptide: 283.1 pg/mL — ABNORMAL HIGH (ref 0.0–100.0)

## 2021-10-06 NOTE — Patient Instructions (Signed)
INCREASE Lasix to 80 mg daily for 3 days, then resume normal dose of 40 mg in the AM and 20 mg in the PM  INCREASE Potassium to 60 meq daily for 3 days, then resume normal dose of 40 meq daily thereafter  Labs today We will only contact you if something comes back abnormal or we need to make some changes. Otherwise no news is good news!  Labs needed in 10-14 days -ok to have drawn at follow up with Dr Cyndia Bent  Please wear your compression hose daily, place them on as soon as you get up in the morning and remove before you go to bed at night.  Your physician recommends that you schedule a follow-up appointment in: 3-4 months with Dr Aundra Dubin  Do the following things EVERYDAY: Weigh yourself in the morning before breakfast. Write it down and keep it in a log. Take your medicines as prescribed Eat low salt foods--Limit salt (sodium) to 2000 mg per day.  Stay as active as you can everyday Limit all fluids for the day to less than 2 liters  At the [facility], you and your health needs are our priority. As part of our continuing mission to provide you with exceptional heart care, we have created designated Provider Care Teams. These Care Teams include your primary Cardiologist (physician) and Advanced Practice Providers (APPs- Physician Assistants and Nurse Practitioners) who all work together to provide you with the care you need, when you need it.   You may see any of the following providers on your designated Care Team at your next follow up: Dr Glori Bickers Dr Haynes Kerns, NP Lyda Jester, [place] [facility] [place], [place] Audry Riles, PharmD   Please be sure to bring in all your medications bottles to every appointment.   If you have any questions or concerns before your next appointment please send [place] a message through [place] or call our office at [phone].    TO LEAVE A MESSAGE FOR THE NURSE SELECT OPTION 2, PLEASE LEAVE A MESSAGE  INCLUDING: YOUR NAME DATE OF BIRTH CALL BACK NUMBER REASON FOR CALL**this is important as we prioritize the call backs  YOU WILL RECEIVE A CALL BACK THE SAME DAY AS LONG AS YOU CALL BEFORE 4:00 PM

## 2021-10-06 NOTE — Progress Notes (Signed)
Patient ID: Luis [name], male   DOB: 1955-07-02, 66 y.o.   MRN: [id number] PCP: Dr. Sarajane Jews Cardiology: Dr. Aundra Dubin  66 y.o. with history of CAD and moderate aortic stenosis presents for follow of AS and CAD. He had RCA PCI in 8/12.     When I saw him in early 2016, he reported significant exertional dyspnea.  I had him do a Lexiscan Cardiolite in 2/16.  This showed no ischemia or infarction.  I also had him get an echo that showed ?bicuspid aortic valve and probably moderate aortic stenosis.  Given ongoing symptoms, I had him do a RHC/LHC and TEE in 2/16.  The RHC showed mildly elevated left and right heart filling pressures and the LHC showed nonobstructive CAD.  TEE confirmed bicuspid aortic valve with moderate AS.  He had PFTs in 4/16 that were within normal limits.  I started him on Lasix 20 mg daily.  He seemed to improve.    Progressive dypnea in fall 2017.  RHC/LHC in 2017 showed mildly elevated left heart filling pressure and nonobstructive CAD.  TEE showed moderate aortic stenosis, bicuspid valve.  Stable ascending aorta dilation.   Echo in 9/18 showed EF 60-65% with bicuspid aortic valve and moderate AS.  CTA chest showed 4.5 cm ascending aorta.  Repeat CTA chest in 8/19 showed stable 4.5 cm ascending aorta.  Echo in 8/20 showed EF 60-65%, moderate AS, 4.5 cm ascending aorta. CTA chest in 8/20 with 4.6 cm ascending aorta. Echo in 1/22 showed EF 60-65%, mild LVH, moderate AS with mean gradient 32 and AVA 1.6 cm^2, bicuspid aortic valve, ascending aorta 46 mm.   He is being treated for bladder cancer with chemotherapy and TURBT.   CTA chest in 8/22 with 4.4 cm ascending aorta.   Echo 3/23 showed EF 60-65% with mild LVH, normal RV, bicuspid aortic valve with severe AS and mean gradient 48 mmHg, AVA 0.91 cm^2.    Follow up 3/23, continued with exertional dyspnea. Referred to [facility] for evaluation of AVR, arranged for [facility] as part of work up. R/LHC (3/23) showed mild CAD, patent RCA stents,  normal PCWP, mildly elevated RA pressure, preserved CO.  Underwent AV replacement and replacement of ascending aorta with Dr. Cyndia Bent 5/23. Had post-op atrial fibrillation, started on amiodarone.   Present back to ED 1 week after discharge with LOC. Echo showed moderate pericardial effusion with maximal diameter of 3 cm over the right ventricle with some compression, LV and aortic valve function normal. Attempted pericardiocentesis in cath lab, but unsuccessful and placed sub-xiphoid pericardial window in OR, with 850 cc fluid removed. Discharged home, weight 275 lbs.  Today he returns for post hospital HF follow up with his wife. Overall feeling fatigued. He has poor energy and has dyspnea with little exertion. Mostly sits around the house during the day. Feet are swelling more and feels he is not urinating as briskly on current dose of diuretics. Denies palpitations, CP, dizziness, abnormal bleeding or PND/Orthopnea. Appetite ok. No fever or chills. Weight at home 275 pounds. Taking all medications.   ECG (personally reviewed): NSR, LBBB 69 bpm  Labs (2/15): K 4.4, creatinine 0.9, LDL 71, HDL 53 Labs (2/16): K 4.8, creatinine 1.0, LDL 62, HDL 57 Labs (8/16): K 4.7, creatinine 0.95, TSH normal, HCT 41.2, LDL 78, HDL 58 Labs (8/17): K 4.3, creatinine 1.05, HCT 40.1, TSH normal, BNP 56 Labs (9/17): creatinine 1.08, BNP 56 Labs (7/18): creatinine 1.2, LDL 53 Labs (7/19): LDL 68 Labs (8/19): K  5, creatinine 1.09 Labs (8/20): K 4.9, creatinine 1.09, LDL 59, HDL 50 Labs (8/21): K 4.6, creatinine 1.0 Labs (1/22): K 4, creatinine 1.02, LDL 70 Labs (9/22): BNP 18, K 3.8, creatinine 0.94 Labs (3/23): LDL 61, HDL 48 Labs (6/23): K 3.8, creatinine 1.16, hgb 7.9  PMH: 1. OSA: Using CPAP.  2. CAD: Cardiolite 8/12 with inferior infarct and peri-infarct ischemia, had PCI to the mid and distal RCA with Promus DES x 2.  Lexiscan Cardiolite (2/16) with no ischemia or infarction. LHC/RHC (2/16) with mean RA  12, PA 32/15, mean PCWP 18, CI 3.47; patent mid and distal RCA stents, 50-60% proximal stenosis small PDA.   - LHC/RHC (9/17): 60% proximal PDA; mean RA 2, PA 24/8, mean PCWP 18, CI 2.13.  - Cardiolite (8/17): EF 56%, normal.  - R/LHC (3/23, AVR work up): mid RCA lesion 40% stenosis, previously placed prox RCA to Mid RCA stent (unknown type) widely patent, previously placed dist RCA stent (unknown type) widely patent.; mean RA 13, PA 23/8, mean PCWP 11, CI 2.65.  3. Carotid stenosis: 2/15 carotid dopplers 1-39% bilateral ICA stenosis.  4. Colon polyps 5. HTN 6. Hyperlipidemia 7. Abdominal US (10/12) with no AAA 8. Aortic stenosis: Echo (2/15) with EF 60-65%, mild LVH, moderate diastolic dysfunction, normal RV size/systolic function, moderate aortic stenosis with mean gradient 31 and AVA 1.16 cm2, ascending aorta 4.4 cm.  Echo (2/16) with EF 60-65%, grade II diastolic dysfunction, ?bicuspid aortic valve with moderate AS (mean gradient 35 mmHg, AVA 1.5 cm^2), mildly dilated RV with normal systolic function.  TEE (2/16) with bicuspid aortic valve, moderate AS mean gradient 30 mmHg, AVA > 1 cm^2, EF 55-60%, mild LVH, mildly dilated RV with normal systolic function, mild AI, mild MR, ascending aorta 4.2 cm.  - Echo (8/17): EF 60-65%, grade II diastolic dysfunction, bicuspid aortic valve with mean gradient 31 mmHg, probably moderate stenosis.  - TEE (9/17): EF 55-60%, bicuspid aortic valve with moderate AS with mean gradient 24 mmHg and AVA 1.05 cm^2, ascending aorta 4.2 cm.  - Echo (9/18): EF 60-65%, bicuspid aortic valve, AVA 1.05 cm^2 with mean gradient 24 mmHg (moderate AS).  - Echo (8/20): EF 60-65%, mild RV dilation with normal systolic function, moderate AS with AVA 1.46 cm^2 and mean gradient 36 mmHg, 4.5 cm ascending aorta.  - Echo (1/22): EF 60-65%, mild LVH, moderate AS with mean gradient 32 and AVA 1.6 cm^2, bicuspid aortic valve, ascending aorta 46 mm.  - Echo (3/23): EF 60-65% with mild LVH,  normal RV, bicuspid aortic valve with severe AS and mean gradient 48 mmHg, AVA 0.91 cm^2. 4.5 cm ascending aorta.  - s/p Aortic Valve Replacement with a 25 mm Edwards Resilia Aortic Valve and Replacement of Ascending Aorta (5/23). 9. Ascending aorta aneurysm: 4.4 cm on 2/15 echo. Ascending aorta 4.2 cm TEE 2/16.  Ascending aorta 4.2 cm on 9/17 TEE.  - CTA chest (9/18): 4.5 cm ascending aorta.  - CTA chest (8/19): 4.5 cm ascending aorta - CTA chest (8/20): 4.6 cm ascending aorta - CTA chest (8/22): 4.4 cm ascending aorta - replacement of ascending aorta 5/23. 10. Chronic diastolic CHF.  11. PFTs (4/16) were within normal limits.  12. Lower extremity arterial dopplers normal in 8/17.  13. Bladder cancer: S/p chemo and TURBT  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  Sister with RyR2 gene.   ROS: All systems reviewed and negative except as per HPI.  Current Outpatient Medications  Medication Sig Dispense Refill   acetaminophen (TYLENOL) 500 MG tablet Take 1-2 tablets (500-1,000 mg total) by mouth every 6 (six) hours as needed for moderate pain. (Patient taking differently: Take 1,000 mg by mouth every 6 (six) hours as needed for moderate pain.) 30 tablet 0   aspirin 81 MG tablet Take 81 mg by mouth every morning.     carvedilol (COREG) 3.125 MG tablet Take 1 tablet (3.125 mg total) by mouth 2 (two) times daily with a meal. 60 tablet 3   furosemide (LASIX) 40 MG tablet TAKE 1 TABLET (40 MG TOTAL) BY MOUTH DAILY AND 1/2 TABLETS (20 MG TOTAL) EVERY EVENING. (Patient taking differently: Take 20-40 mg by mouth See admin instructions. 40 mg daily AND 20 mg every evening) 45 tablet 3   ipratropium (ATROVENT) 0.03 % nasal spray Place 2 sprays into both nostrils every 12 (twelve) hours as needed for rhinitis.     Magnesium 500 MG TABS Take 500 mg by mouth daily.     meclizine (ANTIVERT) 25 MG tablet Take 1 tablet (25 mg total) by mouth 3 (three) times daily as  needed for dizziness. 30 tablet 0   multivitamin-iron-minerals-folic acid (CENTRUM) chewable tablet Chew 1 tablet by mouth daily.     potassium chloride SA (KLOR-CON M) 20 MEQ tablet Take 2 tablets (40 mEq total) by mouth daily. 60 tablet 3   rosuvastatin (CRESTOR) 20 MG tablet TAKE 1 TABLET BY MOUTH EVERYDAY AT BEDTIME 90 tablet 3   tamsulosin (FLOMAX) 0.4 MG CAPS capsule Take 0.4 mg by mouth daily.     traMADol (ULTRAM) 50 MG tablet Take 1 tablet (50 mg total) by mouth every 6 (six) hours as needed for moderate pain. 14 tablet 0   venlafaxine XR (EFFEXOR-XR) 150 MG 24 hr capsule TAKE 1 CAPSULE BY MOUTH DAILY WITH BREAKFAST. 90 capsule 0   No current facility-administered medications for this encounter.   Wt Readings from Last 3 Encounters:  10/06/21 125 kg (275 lb 9.6 oz)  10/03/21 125.6 kg (276 lb 14.4 oz)  09/23/21 126.1 kg (277 lb 14.4 oz)   BP 120/70   Pulse 72   Wt 125 kg (275 lb 9.6 oz)   SpO2 98%   BMI 44.48 kg/m  Physical Exam General:  NAD. No resp difficulty, fatigued-appearing. HEENT: Normal Neck: Supple. Thick neck. Carotids 2+ bilat; no bruits. No lymphadenopathy or thryomegaly appreciated. Cor: PMI nondisplaced. Regular rate & rhythm. No rubs, gallops or murmurs. Lungs: Clear Abdomen: Obese, soft,  nontender, nondistended. No hepatosplenomegaly. No bruits or masses. Good bowel sounds. Extremities: No cyanosis, clubbing, rash, 2+ BLE edema Neuro: Alert & oriented x 3, cranial nerves grossly intact. Moves all 4 extremities w/o difficulty. Affect pleasant.  Assessment/Plan: 1. CAD: Prior PCI to RCA in 8/12.  LHC (9/17) with nonobstructive disease. He was started on Imdur for ?microvascular angina but he developed severe headaches without any improvement in symptoms.  Underwent coronary angiography (3/23) as part of AVR work up and showed patent RCA stents with nonobstructive mild disease. No chest pain now.  - Continue ASA 81 and Crestor.  2. Aortic stenosis: Bicuspid  valve with severe AS on 3/23 echo.  He is now s/p AV replacement and replacement of ascending aorta 5/23. 3. Hyperlipidemia: Lipids ok 3/23. 4. Ascending aortic aneurysm: Associated with bicuspid aortic valve.  4.4 cm on 8/22 CTA, 4.5 cm on echo today.  S/p replacement of ascending aorta 5/23. 5. Chronic diastolic CHF:  NYHA class  II-III symptoms, he is at least mildly volume overloaded on exam.   - Increase Lasix to 80 mg daily x 3 days with extra 20 KCL x 3 days, then resume home dose of Lasix 40 mg q am/20 mg q pm and 20 KCL bid. BMET/BNP today, repeat BMET in a couple weeks. - Place compression hose. 6. OSA: Continue CPAP.  7. HTN: BP controlled. With ascending aortic aneurysm, he is on beta blocker.  8. Pericardial effusion: s/p pericardial window 6/23.  9. Physical deconditioning: we discussed cardiac rehab when he is cleared by [facility], he is undecided. I strongly encouraged him to consider.  Follow up in 3-4 weeks with APP and 3-4 months with Dr. Aundra Dubin.  Maricela Bo [facility] FNP-BC 10/06/2021

## 2021-10-10 ENCOUNTER — Other Ambulatory Visit (HOSPITAL_COMMUNITY): Payer: Self-pay | Admitting: Cardiology

## 2021-10-12 ENCOUNTER — Other Ambulatory Visit: Payer: Self-pay | Admitting: Physician Assistant

## 2021-10-13 NOTE — Addendum Note (Signed)
Addended by: Kerry Dory on: 10/13/2021 04:20 PM   Modules accepted: Orders

## 2021-10-16 DIAGNOSIS — G4733 Obstructive sleep apnea (adult) (pediatric): Secondary | ICD-10-CM | POA: Diagnosis not present

## 2021-10-16 DIAGNOSIS — F419 Anxiety disorder, unspecified: Secondary | ICD-10-CM | POA: Diagnosis not present

## 2021-10-18 ENCOUNTER — Other Ambulatory Visit: Payer: Self-pay | Admitting: Surgery

## 2021-10-18 DIAGNOSIS — I3139 Other pericardial effusion (noninflammatory): Secondary | ICD-10-CM

## 2021-10-20 ENCOUNTER — Ambulatory Visit (INDEPENDENT_AMBULATORY_CARE_PROVIDER_SITE_OTHER): Payer: Self-pay | Admitting: Surgery

## 2021-10-20 ENCOUNTER — Ambulatory Visit (HOSPITAL_COMMUNITY)
Admission: RE | Admit: 2021-10-20 | Discharge: 2021-10-20 | Disposition: A | Discharge status: Home / Self Care | Payer: PPO | Source: Ambulatory Visit | Attending: Cardiology | Admitting: Cardiology

## 2021-10-20 ENCOUNTER — Encounter: Payer: Self-pay | Admitting: Surgery

## 2021-10-20 ENCOUNTER — Ambulatory Visit
Admission: RE | Admit: 2021-10-20 | Discharge: 2021-10-20 | Disposition: A | Discharge status: Home / Self Care | Payer: PPO | Source: Ambulatory Visit | Attending: Surgery | Admitting: Surgery

## 2021-10-20 VITALS — BP 107/67 | HR 76 | Resp 20 | Ht 66.0 in | Wt 262.0 lb

## 2021-10-20 DIAGNOSIS — I7121 Aneurysm of the ascending aorta, without rupture: Secondary | ICD-10-CM

## 2021-10-20 DIAGNOSIS — Z981 Arthrodesis status: Secondary | ICD-10-CM | POA: Diagnosis not present

## 2021-10-20 DIAGNOSIS — I350 Nonrheumatic aortic (valve) stenosis: Secondary | ICD-10-CM

## 2021-10-20 DIAGNOSIS — I517 Cardiomegaly: Secondary | ICD-10-CM | POA: Diagnosis not present

## 2021-10-20 DIAGNOSIS — I5032 Chronic diastolic (congestive) heart failure: Secondary | ICD-10-CM | POA: Insufficient documentation

## 2021-10-20 DIAGNOSIS — J90 Pleural effusion, not elsewhere classified: Secondary | ICD-10-CM | POA: Diagnosis not present

## 2021-10-20 DIAGNOSIS — I3139 Other pericardial effusion (noninflammatory): Secondary | ICD-10-CM

## 2021-10-20 DIAGNOSIS — Z952 Presence of prosthetic heart valve: Secondary | ICD-10-CM

## 2021-10-20 LAB — BASIC METABOLIC PANEL
Anion gap: 11 (ref 5–15)
BUN: 16 mg/dL (ref 8–23)
CO2: 25 mmol/L (ref 22–32)
Calcium: 8.7 mg/dL — ABNORMAL LOW (ref 8.9–10.3)
Chloride: 105 mmol/L (ref 98–111)
Creatinine, Ser: 1 mg/dL (ref 0.61–1.24)
GFR, Estimated: >60 mL/min (ref >60)
Glucose, Bld: 118 mg/dL — ABNORMAL HIGH (ref 70–99)
Potassium: 4.1 mmol/L (ref 3.5–5.1)
Sodium: 141 mmol/L (ref 135–145)

## 2021-10-20 NOTE — Progress Notes (Signed)
HPI: Patient returns for routine postoperative follow-up having undergone aortic valve replacement using a 25 mm Inspiris Resilia pericardial valve and supra-coronary replacement of the ascending aorta under hypothermic circulatory arrest on 09/16/2021. The patient's early postoperative recovery while in the hospital was notable for an uncomplicated postoperative course except for development of atrial fibrillation.  He was started on amiodarone with conversion to sinus rhythm but developed persistent nausea and the amiodarone was discontinued.  We did not start him on anticoagulation due to his recent history of hematuria. Since hospital discharge on 09/23/2021 the patient was readmitted on 09/28/2021 with fairly rapid onset of shortness of breath, fatigue, nausea and abdominal discomfort, and presyncope.  He was found to have a large circumferential pericardial effusion with signs of tamponade and underwent subxiphoid pericardial window on 09/28/2021 draining 850 cc of old bloody fluid.  He was diuresed further and sent home on 10/03/2021.  Since discharge he has been feeling better.  He said that the swelling in his legs has continued to decrease.  He is ambulating daily without chest pain and only mild exertional dyspnea.  He has a follow-up appointment with Dr. Lovena Le soon concerning his postoperative atrial fibrillation.  He has had recurrent hematuria with a history of bladder cancer and has follow-up arranged with his urologist for repeat cystoscopy.   Current Outpatient Medications  Medication Sig Dispense Refill   acetaminophen (TYLENOL) 500 MG tablet Take 1-2 tablets (500-1,000 mg total) by mouth every 6 (six) hours as needed for moderate pain. (Patient taking differently: Take 1,000 mg by mouth every 6 (six) hours as needed for moderate pain.) 30 tablet 0   aspirin 81 MG tablet Take 81 mg by mouth every morning.     carvedilol (COREG) 3.125 MG tablet Take 1 tablet (3.125 mg total) by mouth 2  (two) times daily with a meal. 60 tablet 3   furosemide (LASIX) 20 MG tablet TAKE 1 TABLET (40 MG TOTAL) BY MOUTH DAILY AND 1/2 TABLETS (20 MG TOTAL) EVERY EVENING. 252 tablet 3   ipratropium (ATROVENT) 0.03 % nasal spray Place 2 sprays into both nostrils every 12 (twelve) hours as needed for rhinitis.     Magnesium 500 MG TABS Take 500 mg by mouth daily.     meclizine (ANTIVERT) 25 MG tablet Take 1 tablet (25 mg total) by mouth 3 (three) times daily as needed for dizziness. 30 tablet 0   multivitamin-iron-minerals-folic acid (CENTRUM) chewable tablet Chew 1 tablet by mouth daily.     potassium chloride SA (KLOR-CON M) 20 MEQ tablet Take 2 tablets (40 mEq total) by mouth daily. 60 tablet 3   rosuvastatin (CRESTOR) 20 MG tablet TAKE 1 TABLET BY MOUTH EVERYDAY AT BEDTIME 90 tablet 3   tamsulosin (FLOMAX) 0.4 MG CAPS capsule Take 0.4 mg by mouth daily.     traMADol (ULTRAM) 50 MG tablet Take 1 tablet (50 mg total) by mouth every 6 (six) hours as needed for moderate pain. 14 tablet 0   venlafaxine XR (EFFEXOR-XR) 150 MG 24 hr capsule TAKE 1 CAPSULE BY MOUTH DAILY WITH BREAKFAST. 90 capsule 0   No current facility-administered medications for this visit.    Physical Exam: BP 107/67   Pulse 76   Resp 20   Ht '5\' 6"'$  (1.676 m)   Wt 262 lb (118.8 kg)   SpO2 96%   BMI 42.29 kg/m  He looks well. Cardiac exam shows a regular rate and rhythm with normal heart sounds.  There is  no murmur. Lung exam is clear. The chest incision is healing well and the sternum is stable.  There is slight pinkish discoloration along the incision from the subxiphoid pericardial window.  It looks like it is healing well otherwise. There is mild bilateral lower extremity edema  Diagnostic Tests:  Narrative & Impression  CLINICAL DATA:  s/p pericardial effusion   EXAM: CHEST - 2 VIEW   COMPARISON:  Radiograph 09/30/2021   FINDINGS: Right neck catheter has been removed since the prior exam. Prior median sternotomy  and aortic valve replacement. Mildly enlarged cardiac silhouette, similar to prior exam. No focal airspace disease. Small left pleural effusion. No pneumothorax. No acute osseous abnormality. Cervical spine fusion hardware noted.   IMPRESSION: Mildly enlarged cardiac silhouette, similar to prior, correlate with ECHO to assess for presence of a pericardial effusion.   Small left pleural effusion.  No focal airspace disease.     Electronically Signed   By: Maurine Simmering M.D.   On: 10/20/2021 08:33      Impression:  Overall I think he is doing well 1 month following his surgery.  I encouraged him to continue walking as much as possible.  I told him he could drive when he felt comfortable with that.  Asked him not to lift anything heavier than 10 pounds for 3 months postoperatively.  Plan:  I will see him back in 1 month with a repeat chest x-ray.   Gaye Pollack, MD [facility] [phone]

## 2021-10-29 ENCOUNTER — Ambulatory Visit (HOSPITAL_COMMUNITY): Payer: PPO | Attending: Cardiology

## 2021-10-29 DIAGNOSIS — Z952 Presence of prosthetic heart valve: Secondary | ICD-10-CM | POA: Diagnosis not present

## 2021-10-29 LAB — ECHOCARDIOGRAM COMPLETE
AV Mean grad: 12.8 mmHg
AV Peak grad: 25 mmHg
Ao pk vel: 2.5 m/s
Area-P 1/2: 3.12 cm2
S' Lateral: 2.8 cm

## 2021-10-29 MED ORDER — PERFLUTREN LIPID MICROSPHERE
1.0000 mL | INTRAVENOUS | Status: AC | PRN
  Administered 2021-10-29: 1 mL via INTRAVENOUS

## 2021-11-04 ENCOUNTER — Other Ambulatory Visit (HOSPITAL_COMMUNITY): Payer: PPO

## 2021-11-04 ENCOUNTER — Ambulatory Visit (INDEPENDENT_AMBULATORY_CARE_PROVIDER_SITE_OTHER): Payer: PPO | Admitting: Internal Medicine

## 2021-11-04 ENCOUNTER — Encounter: Payer: Self-pay | Admitting: Internal Medicine

## 2021-11-04 VITALS — BP 132/76 | HR 73 | Ht 66.0 in | Wt 267.6 lb

## 2021-11-04 DIAGNOSIS — I480 Paroxysmal atrial fibrillation: Secondary | ICD-10-CM

## 2021-11-04 DIAGNOSIS — I5032 Chronic diastolic (congestive) heart failure: Secondary | ICD-10-CM

## 2021-11-04 DIAGNOSIS — Z952 Presence of prosthetic heart valve: Secondary | ICD-10-CM

## 2021-11-04 NOTE — Patient Instructions (Addendum)
Medication Instructions:  Your physician recommends that you continue on your current medications as directed. Please refer to the Current Medication list given to you today.  *If you need a refill on your cardiac medications before your next appointment, please call your pharmacy*  Lab Work: None ordered.  If you have labs (blood work) drawn today and your tests are completely normal, you will receive your results only by: MyChart Message (if you have MyChart) OR A paper copy in the mail If you have any lab test that is abnormal or we need to change your treatment, we will call you to review the results.  Testing/Procedures: None ordered.  Follow-Up: At [facility], you and your health needs are our priority.  As part of our continuing mission to provide you with exceptional heart care, we have created designated Provider Care Teams.  These Care Teams include your primary Cardiologist (physician) and Advanced Practice Providers (APPs -  Physician Assistants and Nurse Practitioners) who all work together to provide you with the care you need, when you need it.  We recommend signing up for the patient portal called "MyChart".  Sign up information is provided on this After Visit Summary.  MyChart is used to connect with patients for Virtual Visits (Telemedicine).  Patients are able to view lab/test results, encounter notes, upcoming appointments, etc.  Non-urgent messages can be sent to your provider as well.   To learn more about what you can do with MyChart, go to [email or web address].    Your next appointment:   AS NEEDED  The format for your next appointment:   In Person  Provider:   Gregg Taylor, MD{or one of the following Advanced Practice Providers on your designated Care Team:   Renee Ursuy, PA-C Michael "Andy" Tillery, PA-C  Important Information About Sugar        

## 2021-11-04 NOTE — Progress Notes (Signed)
HPI Luis [name] returns today for follow-up of atrial fibrillation.  He is a very pleasant 66 year old man with a history of aortic valve stenosis and an enlarged ascending aorta who underwent surgical aortic valve replacement and aortic root replacement back in May.  He had postoperative atrial fibrillation.  He represented in June with a large pericardial effusion and syncope.  He underwent subxiphoid pericardial window placement.  His postoperative course was fairly unremarkable.  He was discharged on amiodarone.  This was ultimately stopped.  During his hospital course he developed hematuria and has been found to have recurrent bladder cancer.  He is pending additional treatment by Dr. Tammi Klippel.  His hematuria has resolved with discontinuation of his oral anticoagulant.  He has had no palpitations or other evidence of atrial fibrillation. Allergies  Allergen Reactions   Roxicodone [Oxycodone] Other (See Comments)    Insomnia, "makes pt crazy".     Current Outpatient Medications  Medication Sig Dispense Refill   acetaminophen (TYLENOL) 500 MG tablet Take 1-2 tablets (500-1,000 mg total) by mouth every 6 (six) hours as needed for moderate pain. (Patient taking differently: Take 1,000 mg by mouth every 6 (six) hours as needed for moderate pain.) 30 tablet 0   aspirin 81 MG tablet Take 81 mg by mouth every morning.     carvedilol (COREG) 3.125 MG tablet Take 1 tablet (3.125 mg total) by mouth 2 (two) times daily with a meal. 60 tablet 3   citalopram (CELEXA) 40 MG tablet Take 1 tablet by mouth daily.     Ferrous Sulfate (IRON PO) Take by mouth. 1 tab daily     furosemide (LASIX) 20 MG tablet TAKE 1 TABLET (40 MG TOTAL) BY MOUTH DAILY AND 1/2 TABLETS (20 MG TOTAL) EVERY EVENING. 252 tablet 3   ipratropium (ATROVENT) 0.03 % nasal spray Place 2 sprays into both nostrils every 12 (twelve) hours as needed for rhinitis.     Magnesium 500 MG TABS Take 500 mg by mouth daily.     meclizine  (ANTIVERT) 25 MG tablet Take 1 tablet (25 mg total) by mouth 3 (three) times daily as needed for dizziness. 30 tablet 0   multivitamin-iron-minerals-folic acid (CENTRUM) chewable tablet Chew 1 tablet by mouth daily.     potassium chloride SA (KLOR-CON M) 20 MEQ tablet Take 2 tablets (40 mEq total) by mouth daily. 60 tablet 3   rosuvastatin (CRESTOR) 20 MG tablet TAKE 1 TABLET BY MOUTH EVERYDAY AT BEDTIME 90 tablet 3   tamsulosin (FLOMAX) 0.4 MG CAPS capsule Take 0.4 mg by mouth daily.     traMADol (ULTRAM) 50 MG tablet Take 1 tablet (50 mg total) by mouth every 6 (six) hours as needed for moderate pain. 14 tablet 0   venlafaxine XR (EFFEXOR-XR) 150 MG 24 hr capsule TAKE 1 CAPSULE BY MOUTH DAILY WITH BREAKFAST. 90 capsule 0   No current facility-administered medications for this visit.     Past Medical History:  Diagnosis Date   Anxiety    takes Valium as needed   Aortic stenosis, moderate    Arthritis    Ascending aortic aneurysm (HCC)    CAD (coronary artery disease)    a. s/p PCI of the RCA 8/12 with DES by Dr Burt Knack, preserved EF. b. LHC/RHC (2/16) with mean RA 12, PA 32/15, mean PCWP 18, CI 3.47; patent mid and distal RCA stents, 50-60% proximal stenosis small PDA.      Cancer [facility])    bladder  Carotid stenosis    a. Carotid US (05/2013):  Bilateral 1-39% ICA; L thyroid nodule (prior hx of aspiration).   Chronic diastolic CHF (congestive heart failure) (HCC)    Complication of anesthesia    difficulty waking up after gallbladder surgery   Depression    Essential hypertension    GERD (gastroesophageal reflux disease)    if needed will take OTC meds    Heart murmur    History of colonic polyps    hyperplastic   Hyperlipidemia    Joint pain    Lesion of bladder    Myocardial infarction ([place]) 2012   Obesity (BMI 30-39.9) 02/29/2016   Restless leg    Sleep apnea    uses cpap   Tubular adenoma of colon    Vertigo    takes Meclizine as needed    ROS:   All systems  reviewed and negative except as noted in the HPI.   Past Surgical History:  Procedure Laterality Date   AORTIC VALVE REPLACEMENT N/A 09/16/2021   Procedure: AORTIC VALVE REPLACEMENT (AVR);  Surgeon: Gaye Pollack, MD;  Location: [place];  Service: Open Heart Surgery;  Laterality: N/A;   CATARACT EXTRACTION   4 YRS AGO   BOTH EYES   CHOLECYSTECTOMY  07/21/2011   Procedure: LAPAROSCOPIC CHOLECYSTECTOMY WITH INTRAOPERATIVE CHOLANGIOGRAM;  Surgeon: Shann Medal, MD;  Location: [facility];  Service: General;  Laterality: N/A;   CORONARY ANGIOPLASTY  2012   2 stents   coronary stenting     s/p PCI of the RCA by Dr Burt Knack 8/12 with 2 promus stents   CYSTOSCOPY W/ RETROGRADES Bilateral 12/13/2019   Procedure: CYSTOSCOPY WITH RETROGRADE PYELOGRAM;  Surgeon: Alexis Frock, MD;  Location: [facility];  Service: Urology;  Laterality: Bilateral;   CYSTOSCOPY W/ RETROGRADES Bilateral 10/14/2020   Procedure: CYSTOSCOPY WITH RETROGRADE PYELOGRAM;  Surgeon: Alexis Frock, MD;  Location: [facility];  Service: Urology;  Laterality: Bilateral;   CYSTOSCOPY W/ RETROGRADES Bilateral 12/16/2020   Procedure: CYSTOSCOPY WITH RETROGRADE PYELOGRAM;  Surgeon: Alexis Frock, MD;  Location: [facility];  Service: Urology;  Laterality: Bilateral;   LEFT AND RIGHT HEART CATHETERIZATION WITH CORONARY ANGIOGRAM N/A 06/23/2014   Procedure: LEFT AND RIGHT HEART CATHETERIZATION WITH CORONARY ANGIOGRAM;  Surgeon: Larey Dresser, MD;  Location: [facility] CATH LAB;  Service: Cardiovascular;  Laterality: N/A;   NECK SURGERY  03/23/09   per Dr. Lorin Mercy, cervical fusion    PERICARDIOCENTESIS N/A 09/28/2021   Procedure: PERICARDIOCENTESIS;  Surgeon: Jettie Booze, MD;  Location: [place] CV LAB;  Service: Cardiovascular;  Laterality: N/A;   REPLACEMENT ASCENDING AORTA N/A 09/16/2021   Procedure: REPLACEMENT ASCENDING AORTA WITH 30 X 10MM HEMASHIELD PLATINUM WOVEN DOUBLE VELOUR VASCULAR  GRAFT;  Surgeon: Gaye Pollack, MD;  Location: [place];  Service: Open Heart Surgery;  Laterality: N/A;  CIRC ARREST   right elbow surgery     RIGHT HEART CATH AND CORONARY ANGIOGRAPHY N/A 07/08/2021   Procedure: RIGHT HEART CATH AND CORONARY ANGIOGRAPHY;  Surgeon: Larey Dresser, MD;  Location: [place] CV LAB;  Service: Cardiovascular;  Laterality: N/A;   solonscopy  05/23/08   per Dr. Wynona Luna hemorrhoids only, repeat in 5 years   SUBXYPHOID PERICARDIAL WINDOW N/A 09/28/2021   Procedure: SUBXYPHOID PERICARDIAL WINDOW;  Surgeon: Gaye Pollack, MD;  Location: [place];  Service: Thoracic;  Laterality: N/A;   TEE WITHOUT CARDIOVERSION N/A 06/23/2014   Procedure: TRANSESOPHAGEAL ECHOCARDIOGRAM (TEE);  Surgeon: Elby Showers  Aundra Dubin, MD;  Location: [facility] ENDOSCOPY;  Service: Cardiovascular;  Laterality: N/A;   TEE WITHOUT CARDIOVERSION N/A 01/21/2016   Procedure: TRANSESOPHAGEAL ECHOCARDIOGRAM (TEE);  Surgeon: Larey Dresser, MD;  Location: [place];  Service: Cardiovascular;  Laterality: N/A;   TEE WITHOUT CARDIOVERSION N/A 09/16/2021   Procedure: TRANSESOPHAGEAL ECHOCARDIOGRAM (TEE);  Surgeon: Gaye Pollack, MD;  Location: [place];  Service: Open Heart Surgery;  Laterality: N/A;   TEE WITHOUT CARDIOVERSION N/A 09/28/2021   Procedure: TRANSESOPHAGEAL ECHOCARDIOGRAM (TEE);  Surgeon: Gaye Pollack, MD;  Location: [facility] OR;  Service: Thoracic;  Laterality: N/A;   TONSILLECTOMY     TRANSURETHRAL RESECTION OF BLADDER TUMOR N/A 10/21/2019   Procedure: TRANSURETHRAL RESECTION OF BLADDER TUMOR (TURBT);  Surgeon: Kathie Rhodes, MD;  Location: [facility];  Service: Urology;  Laterality: N/A;   TRANSURETHRAL RESECTION OF BLADDER TUMOR N/A 12/13/2019   Procedure: TRANSURETHRAL RESECTION OF BLADDER TUMOR (TURBT);  Surgeon: Alexis Frock, MD;  Location: [facility];  Service: Urology;  Laterality: N/A;  1 HR   TRANSURETHRAL RESECTION OF BLADDER TUMOR N/A 10/14/2020   Procedure:  TRANSURETHRAL RESECTION OF BLADDER TUMOR (TURBT);  Surgeon: Alexis Frock, MD;  Location: [facility];  Service: Urology;  Laterality: N/A;   TRANSURETHRAL RESECTION OF BLADDER TUMOR N/A 12/16/2020   Procedure: RESTAGING TRANSURETHRAL RESECTION OF BLADDER TUMOR (TURBT);  Surgeon: Alexis Frock, MD;  Location: [facility];  Service: Urology;  Laterality: N/A;     Family History  Problem Relation Age of Onset   Lung cancer Mother        lung   Esophageal cancer Cousin    Colon cancer Neg Hx    Rectal cancer Neg Hx    Stomach cancer Neg Hx      Social History   Socioeconomic History   Marital status: Married    Spouse name: Not on file   Number of children: 4   Years of education: Not on file   Highest education level: Not on file  Occupational History   Occupation: Designer, multimedia: Programmer, systems  Tobacco Use   Smoking status: Former    Packs/day: 2.00    Years: 40.00    Total pack years: 80.00    Types: Cigarettes    Quit date: 2012    Years since quitting: 11.5   Smokeless tobacco: Never  Vaping Use   Vaping Use: Never used  Substance and Sexual Activity   Alcohol use: No    Alcohol/week: 0.0 standard drinks of alcohol   Drug use: No   Sexual activity: Not on file  Other Topics Concern   Not on file  Social History Narrative   Not on file   Social Determinants of Health   Financial Resource Strain: Low Risk  (08/18/2021)   Overall Financial Resource Strain (CARDIA)    Difficulty of Paying Living Expenses: Not hard at all  Food Insecurity: No Food Insecurity (08/18/2021)   Hunger Vital Sign    Worried About Running Out of Food in the Last Year: Never true    [place] in the Last Year: Never true  Transportation Needs: No Transportation Needs (08/18/2021)   PRAPARE - Hydrologist (Medical): No    Lack of Transportation (Non-Medical): No  Physical Activity:  Insufficiently Active (08/18/2021)   Exercise Vital Sign    Days of Exercise per Week: 3 days    Minutes of Exercise  per Session: 30 min  Stress: No Stress Concern Present (08/18/2021)   [place]    Feeling of Stress : Not at all  Social Connections: Moderately Isolated (08/18/2021)   Social Connection and Isolation Panel [NHANES]    Frequency of Communication with Friends and Family: More than three times a week    Frequency of Social Gatherings with Friends and Family: More than three times a week    Attends Religious Services: Never    Marine scientist or Organizations: No    Attends Archivist Meetings: Never    Marital Status: Married  Human resources officer Violence: Not At Risk (08/18/2021)   Humiliation, Afraid, Rape, and Kick questionnaire    Fear of Current or Ex-Partner: No    Emotionally Abused: No    Physically Abused: No    Sexually Abused: No     BP 132/76   Pulse 73   Ht '5\' 6"'$  (1.676 m)   Wt 267 lb 9.6 oz (121.4 kg)   SpO2 96%   BMI 43.19 kg/m   Physical Exam:  Well appearing NAD HEENT: Unremarkable Neck:  No JVD, no thyromegally Lymphatics:  No adenopathy Back:  No CVA tenderness Lungs:  Clear, with no wheezes or rhonchi HEART:  Regular rate rhythm, no murmurs, no rubs, no clicks Abd:  soft, positive bowel sounds, no organomegally, no rebound, no guarding Ext:  2 plus pulses, no edema, no cyanosis, no clubbing Skin:  No rashes no nodules Neuro:  CN II through XII intact, motor grossly intact  EKG -normal sinus rhythm  Assess/Plan:  1.  Postoperative atrial fibrillation -the patient is maintaining sinus rhythm and feels well.  He is a not particularly good candidate for systemic anticoagulation at the present time.  He will undergo watchful waiting.  At this point I would not recommend an antiarrhythmic drug but rather watchful waiting. 2.  Aortic valve stenosis -he is status  postsurgical aortic valve replacement and his exam today demonstrates no evidence of significant aortic stenosis. 3.  Coronary artery disease -he denies anginal symptoms. 4.  Hematuria -he has had no recurrent hematuria since discontinuation of systemic anticoagulation.  Following the treatment of his bladder cancer, if he develops recurrent A-fib, we would consider reinitiation of systemic anticoagulation.  Cristopher Peru, MD

## 2021-11-05 DIAGNOSIS — R338 Other retention of urine: Secondary | ICD-10-CM | POA: Diagnosis not present

## 2021-11-05 DIAGNOSIS — C678 Malignant neoplasm of overlapping sites of bladder: Secondary | ICD-10-CM | POA: Diagnosis not present

## 2021-11-15 DIAGNOSIS — G4733 Obstructive sleep apnea (adult) (pediatric): Secondary | ICD-10-CM | POA: Diagnosis not present

## 2021-11-15 DIAGNOSIS — F419 Anxiety disorder, unspecified: Secondary | ICD-10-CM | POA: Diagnosis not present

## 2021-11-22 ENCOUNTER — Other Ambulatory Visit: Payer: Self-pay | Admitting: Family Medicine

## 2021-11-22 DIAGNOSIS — F411 Generalized anxiety disorder: Secondary | ICD-10-CM

## 2021-11-24 ENCOUNTER — Encounter: Payer: Self-pay | Admitting: Surgery

## 2021-11-24 ENCOUNTER — Ambulatory Visit (INDEPENDENT_AMBULATORY_CARE_PROVIDER_SITE_OTHER): Payer: Self-pay | Admitting: Surgery

## 2021-11-24 VITALS — BP 134/80 | HR 86 | Resp 20 | Ht 66.0 in | Wt 268.0 lb

## 2021-11-24 DIAGNOSIS — I3139 Other pericardial effusion (noninflammatory): Secondary | ICD-10-CM

## 2021-11-24 DIAGNOSIS — I7121 Aneurysm of the ascending aorta, without rupture: Secondary | ICD-10-CM

## 2021-11-24 DIAGNOSIS — Z952 Presence of prosthetic heart valve: Secondary | ICD-10-CM

## 2021-11-24 DIAGNOSIS — I350 Nonrheumatic aortic (valve) stenosis: Secondary | ICD-10-CM

## 2021-11-24 NOTE — Progress Notes (Signed)
HPI:  Patient returns for routine postoperative follow-up having undergone aortic valve replacement using a 25 mm Inspiris Resilia pericardial valve and supra-coronary replacement of the ascending aorta under hypothermic circulatory arrest on 09/16/2021.   The patient's early postoperative recovery while in the hospital was notable for an uncomplicated postoperative course except for development of atrial fibrillation.  He was started on amiodarone with conversion to sinus rhythm but developed persistent nausea and the amiodarone was discontinued.  We did not start him on anticoagulation due to his recent history of hematuria. Since hospital discharge on 09/23/2021 the patient was readmitted on 09/28/2021 with fairly rapid onset of shortness of breath, fatigue, nausea and abdominal discomfort, and presyncope.  He was found to have a large circumferential pericardial effusion with signs of tamponade and underwent subxiphoid pericardial window on 09/28/2021 draining 850 cc of old bloody fluid.  He was diuresed further and sent home on 10/03/2021.  He recently saw Dr. Lovena Le in follow-up concerning his atrial fibrillation and was in sinus rhythm.  No antiarrhythmic therapy was recommended and given his hematuria with a history of bladder cancer he was not started on anticoagulation.  He continues to feel well.  He was seen by his urologist and underwent cystoscopy revealing what he calls a red spot on his bladder that they are going to follow-up again in about 3 months.  He continues to feel well and is walking daily without chest pain or shortness of breath.  He has had no peripheral edema.  Current Outpatient Medications  Medication Sig Dispense Refill   acetaminophen (TYLENOL) 500 MG tablet Take 1-2 tablets (500-1,000 mg total) by mouth every 6 (six) hours as needed for moderate pain. (Patient taking differently: Take 1,000 mg by mouth every 6 (six) hours as needed for moderate pain.) 30 tablet 0   aspirin  81 MG tablet Take 81 mg by mouth every morning.     carvedilol (COREG) 3.125 MG tablet Take 1 tablet (3.125 mg total) by mouth 2 (two) times daily with a meal. 60 tablet 3   citalopram (CELEXA) 40 MG tablet Take 1 tablet by mouth daily.     Ferrous Sulfate (IRON PO) Take by mouth. 1 tab daily     furosemide (LASIX) 20 MG tablet TAKE 1 TABLET (40 MG TOTAL) BY MOUTH DAILY AND 1/2 TABLETS (20 MG TOTAL) EVERY EVENING. 252 tablet 3   ipratropium (ATROVENT) 0.03 % nasal spray Place 2 sprays into both nostrils every 12 (twelve) hours as needed for rhinitis.     Magnesium 500 MG TABS Take 500 mg by mouth daily.     meclizine (ANTIVERT) 25 MG tablet Take 1 tablet (25 mg total) by mouth 3 (three) times daily as needed for dizziness. 30 tablet 0   multivitamin-iron-minerals-folic acid (CENTRUM) chewable tablet Chew 1 tablet by mouth daily.     potassium chloride SA (KLOR-CON M) 20 MEQ tablet Take 2 tablets (40 mEq total) by mouth daily. 60 tablet 3   rosuvastatin (CRESTOR) 20 MG tablet TAKE 1 TABLET BY MOUTH EVERYDAY AT BEDTIME 90 tablet 3   tamsulosin (FLOMAX) 0.4 MG CAPS capsule Take 0.4 mg by mouth daily.     traMADol (ULTRAM) 50 MG tablet Take 1 tablet (50 mg total) by mouth every 6 (six) hours as needed for moderate pain. 14 tablet 0   venlafaxine XR (EFFEXOR-XR) 150 MG 24 hr capsule TAKE 1 CAPSULE BY MOUTH DAILY WITH BREAKFAST. 90 capsule 0   No current facility-administered medications for  this visit.     Physical Exam: BP 134/80   Pulse 86   Resp 20   Ht '5\' 6"'$  (1.676 m)   Wt 268 lb (121.6 kg)   SpO2 97% Comment: RA  BMI 43.26 kg/m  He looks well. Cardiac exam shows regular rate and rhythm with normal heart sounds.  There is no murmur. Lungs are clear. The chest incision is well-healed and the sternum is stable.  The subxiphoid incision is well-healed and there is no sign of a hernia. There is no peripheral edema  Diagnostic Tests:  None today  Impression:  He has now  approximately 2 months out from his initial surgery and doing well.  I encouraged him to continue walking as much as possible.  I asked him not to lift anything heavier than 10 pounds for 3 months postoperatively.  Plan:  He has a follow-up appointment with Dr. Aundra Dubin in September.  He will return to see me in 1 year with a CTA of the chest for aortic surveillance.   Gaye Pollack, MD [facility] [phone]

## 2021-11-30 DIAGNOSIS — G4733 Obstructive sleep apnea (adult) (pediatric): Secondary | ICD-10-CM | POA: Diagnosis not present

## 2021-12-16 DIAGNOSIS — G4733 Obstructive sleep apnea (adult) (pediatric): Secondary | ICD-10-CM | POA: Diagnosis not present

## 2021-12-16 DIAGNOSIS — F419 Anxiety disorder, unspecified: Secondary | ICD-10-CM | POA: Diagnosis not present

## 2021-12-19 ENCOUNTER — Other Ambulatory Visit: Payer: Self-pay | Admitting: Physician Assistant

## 2021-12-25 ENCOUNTER — Other Ambulatory Visit: Payer: Self-pay | Admitting: Physician Assistant

## 2021-12-25 ENCOUNTER — Other Ambulatory Visit: Payer: Self-pay | Admitting: Family Medicine

## 2021-12-25 DIAGNOSIS — F411 Generalized anxiety disorder: Secondary | ICD-10-CM

## 2021-12-28 ENCOUNTER — Other Ambulatory Visit (HOSPITAL_COMMUNITY): Payer: Self-pay | Admitting: *Deleted

## 2021-12-28 MED ORDER — POTASSIUM CHLORIDE CRYS ER 20 MEQ PO TBCR: 40.0000 meq | EXTENDED_RELEASE_TABLET | Freq: Every day | ORAL | 3 refills | Status: DC

## 2021-12-28 MED ORDER — CARVEDILOL 3.125 MG PO TABS: 3.1250 mg | ORAL_TABLET | Freq: Two times a day (BID) | ORAL | 3 refills | Status: DC

## 2021-12-28 NOTE — Telephone Encounter (Signed)
Received vm that patient needed a refill I called back to see which med needed a refill. No answer left vm for return call

## 2022-01-04 ENCOUNTER — Encounter: Payer: Self-pay | Admitting: Family Medicine

## 2022-01-12 ENCOUNTER — Other Ambulatory Visit (HOSPITAL_COMMUNITY): Payer: Self-pay | Admitting: Cardiology

## 2022-01-16 DIAGNOSIS — G4733 Obstructive sleep apnea (adult) (pediatric): Secondary | ICD-10-CM | POA: Diagnosis not present

## 2022-01-16 DIAGNOSIS — F419 Anxiety disorder, unspecified: Secondary | ICD-10-CM | POA: Diagnosis not present

## 2022-01-17 ENCOUNTER — Ambulatory Visit (HOSPITAL_COMMUNITY)
Admission: RE | Admit: 2022-01-17 | Discharge: 2022-01-17 | Disposition: A | Discharge status: Home / Self Care | Payer: PPO | Source: Ambulatory Visit | Attending: Cardiology | Admitting: Cardiology

## 2022-01-17 ENCOUNTER — Other Ambulatory Visit (HOSPITAL_COMMUNITY): Payer: Self-pay

## 2022-01-17 ENCOUNTER — Other Ambulatory Visit (HOSPITAL_COMMUNITY): Payer: Self-pay | Admitting: Cardiology

## 2022-01-17 VITALS — BP 130/70 | HR 64 | Wt 278.6 lb

## 2022-01-17 DIAGNOSIS — I350 Nonrheumatic aortic (valve) stenosis: Secondary | ICD-10-CM | POA: Diagnosis not present

## 2022-01-17 DIAGNOSIS — Z7984 Long term (current) use of oral hypoglycemic drugs: Secondary | ICD-10-CM | POA: Insufficient documentation

## 2022-01-17 DIAGNOSIS — E7841 Elevated Lipoprotein(a): Secondary | ICD-10-CM | POA: Insufficient documentation

## 2022-01-17 DIAGNOSIS — Z9221 Personal history of antineoplastic chemotherapy: Secondary | ICD-10-CM | POA: Diagnosis not present

## 2022-01-17 DIAGNOSIS — Z7982 Long term (current) use of aspirin: Secondary | ICD-10-CM | POA: Insufficient documentation

## 2022-01-17 DIAGNOSIS — Z952 Presence of prosthetic heart valve: Secondary | ICD-10-CM | POA: Insufficient documentation

## 2022-01-17 DIAGNOSIS — I3139 Other pericardial effusion (noninflammatory): Secondary | ICD-10-CM | POA: Insufficient documentation

## 2022-01-17 DIAGNOSIS — I5032 Chronic diastolic (congestive) heart failure: Secondary | ICD-10-CM | POA: Diagnosis not present

## 2022-01-17 DIAGNOSIS — I2510 Atherosclerotic heart disease of native coronary artery without angina pectoris: Secondary | ICD-10-CM | POA: Insufficient documentation

## 2022-01-17 DIAGNOSIS — Z87891 Personal history of nicotine dependence: Secondary | ICD-10-CM | POA: Insufficient documentation

## 2022-01-17 DIAGNOSIS — G4733 Obstructive sleep apnea (adult) (pediatric): Secondary | ICD-10-CM | POA: Diagnosis not present

## 2022-01-17 DIAGNOSIS — I110 Hypertensive heart disease with heart failure: Secondary | ICD-10-CM | POA: Diagnosis not present

## 2022-01-17 DIAGNOSIS — Z8249 Family history of ischemic heart disease and other diseases of the circulatory system: Secondary | ICD-10-CM | POA: Insufficient documentation

## 2022-01-17 DIAGNOSIS — I7121 Aneurysm of the ascending aorta, without rupture: Secondary | ICD-10-CM | POA: Diagnosis not present

## 2022-01-17 DIAGNOSIS — Z955 Presence of coronary angioplasty implant and graft: Secondary | ICD-10-CM | POA: Insufficient documentation

## 2022-01-17 DIAGNOSIS — Z8551 Personal history of malignant neoplasm of bladder: Secondary | ICD-10-CM | POA: Insufficient documentation

## 2022-01-17 DIAGNOSIS — E782 Mixed hyperlipidemia: Secondary | ICD-10-CM

## 2022-01-17 DIAGNOSIS — Z79899 Other long term (current) drug therapy: Secondary | ICD-10-CM | POA: Insufficient documentation

## 2022-01-17 LAB — LIPID PANEL
Cholesterol: 155 mg/dL (ref 0–200)
HDL: 57 mg/dL (ref >40)
LDL Cholesterol: 71 mg/dL (ref 0–99)
Total CHOL/HDL Ratio: 2.7 RATIO
Triglycerides: 136 mg/dL (ref <150)
VLDL: 27 mg/dL (ref 0–40)

## 2022-01-17 LAB — BASIC METABOLIC PANEL
Anion gap: 7 (ref 5–15)
BUN: 15 mg/dL (ref 8–23)
CO2: 27 mmol/L (ref 22–32)
Calcium: 9 mg/dL (ref 8.9–10.3)
Chloride: 105 mmol/L (ref 98–111)
Creatinine, Ser: 1.06 mg/dL (ref 0.61–1.24)
GFR, Estimated: >60 mL/min (ref >60)
Glucose, Bld: 120 mg/dL — ABNORMAL HIGH (ref 70–99)
Potassium: 4.3 mmol/L (ref 3.5–5.1)
Sodium: 139 mmol/L (ref 135–145)

## 2022-01-17 LAB — CBC
HCT: 38.4 % — ABNORMAL LOW (ref 39.0–52.0)
Hemoglobin: 12.4 g/dL — ABNORMAL LOW (ref 13.0–17.0)
MCH: 27.1 pg (ref 26.0–34.0)
MCHC: 32.3 g/dL (ref 30.0–36.0)
MCV: 83.8 fL (ref 80.0–100.0)
Platelets: 165 10*3/uL (ref 150–400)
RBC: 4.58 MIL/uL (ref 4.22–5.81)
RDW: 17.1 % — ABNORMAL HIGH (ref 11.5–15.5)
WBC: 7.1 10*3/uL (ref 4.0–10.5)
nRBC: 0 % (ref 0.0–0.2)

## 2022-01-17 LAB — BRAIN NATRIURETIC PEPTIDE: B Natriuretic Peptide: 47.6 pg/mL (ref 0.0–100.0)

## 2022-01-17 MED ORDER — EMPAGLIFLOZIN 10 MG PO TABS: 10.0000 mg | ORAL_TABLET | Freq: Every day | ORAL | 11 refills | Status: DC

## 2022-01-17 MED ORDER — FUROSEMIDE 20 MG PO TABS: 40.0000 mg | ORAL_TABLET | Freq: Two times a day (BID) | ORAL | 3 refills | Status: DC

## 2022-01-17 NOTE — Patient Instructions (Addendum)
Changes Lasix to 60 mg in the morning and 40 mg in the evening for 3 days, then change to 40 mg Twice daily  Start Jardiance 10 mg daily   Labs done today, your results will be available in MyChart, we will contact you for abnormal readings.  Repeat blood work in 10 days  Your physician recommends that you schedule a follow-up appointment in: 1 month  If you have any questions or concerns before your next appointment please send [place] a message through [place] or call our office at [phone].    TO LEAVE A MESSAGE FOR THE NURSE SELECT OPTION 2, PLEASE LEAVE A MESSAGE INCLUDING: YOUR NAME DATE OF BIRTH CALL BACK NUMBER REASON FOR CALL**this is important as we prioritize the call backs  YOU WILL RECEIVE A CALL BACK THE SAME DAY AS LONG AS YOU CALL BEFORE 4:00 PM  At the [facility], you and your health needs are our priority. As part of our continuing mission to provide you with exceptional heart care, we have created designated Provider Care Teams. These Care Teams include your primary Cardiologist (physician) and Advanced Practice Providers (APPs- Physician Assistants and Nurse Practitioners) who all work together to provide you with the care you need, when you need it.   You may see any of the following providers on your designated Care Team at your next follow up: Dr Glori Bickers Dr [name] Dr. Roxana Hires, NP Lyda Jester, [place] [facility] [place], [place] Forestine Na, NP Audry Riles, PharmD   Please be sure to bring in all your medications bottles to every appointment.

## 2022-01-18 NOTE — Progress Notes (Signed)
Patient ID: JEANPAUL BIEHL, male   DOB: 03/21/1956, 66 y.o.   MRN: [id number] PCP: Dr. Sarajane Jews Cardiology: Dr. Aundra Dubin  66 y.o. with history of CAD and moderate aortic stenosis presents for follow of AS and CAD. He had RCA PCI in 8/12.     When I saw him in early 2016, he reported significant exertional dyspnea.  I had him do a Lexiscan Cardiolite in 2/16.  This showed no ischemia or infarction.  I also had him get an echo that showed ?bicuspid aortic valve and probably moderate aortic stenosis.  Given ongoing symptoms, I had him do a RHC/LHC and TEE in 2/16.  The RHC showed mildly elevated left and right heart filling pressures and the LHC showed nonobstructive CAD.  TEE confirmed bicuspid aortic valve with moderate AS.  He had PFTs in 4/16 that were within normal limits.  I started him on Lasix 20 mg daily.  He seemed to improve.    Progressive dypnea in fall 2017.  RHC/LHC in 2017 showed mildly elevated left heart filling pressure and nonobstructive CAD.  TEE showed moderate aortic stenosis, bicuspid valve.  Stable ascending aorta dilation.   Echo in 9/18 showed EF 60-65% with bicuspid aortic valve and moderate AS.  CTA chest showed 4.5 cm ascending aorta.  Repeat CTA chest in 8/19 showed stable 4.5 cm ascending aorta.  Echo in 8/20 showed EF 60-65%, moderate AS, 4.5 cm ascending aorta. CTA chest in 8/20 with 4.6 cm ascending aorta. Echo in 1/22 showed EF 60-65%, mild LVH, moderate AS with mean gradient 32 and AVA 1.6 cm^2, bicuspid aortic valve, ascending aorta 46 mm.   He is being treated for bladder cancer with chemotherapy and TURBT.   CTA chest in 8/22 with 4.4 cm ascending aorta.   Echo 3/23 showed EF 60-65% with mild LVH, normal RV, bicuspid aortic valve with severe AS and mean gradient 48 mmHg, AVA 0.91 cm^2.    Follow up 3/23, continued with exertional dyspnea. Referred to [facility] for evaluation of AVR, arranged for [facility] as part of work up. R/LHC (3/23) showed mild CAD, patent RCA stents,  normal PCWP, mildly elevated RA pressure, preserved CO.  Underwent AV replacement and replacement of ascending aorta with Dr. Cyndia Bent 5/23. Had post-op atrial fibrillation, started on amiodarone/warfarin. Presented back to ED 1 week after discharge with LOC. Echo showed moderate pericardial effusion with maximal diameter of 3 cm over the right ventricle with some compression, LV and aortic valve function normal. Attempted pericardiocentesis in cath lab, but unsuccessful and placed sub-xiphoid pericardial window in OR, with 850 cc fluid removed. Discharged home, weight 275 lbs.  Post-op echo in 7/23 showed EF 60-65%, normal RV, stable bioprosthetic aortic valve.   He had hematuria and found to have likely recurrence of bladder cancer.   Today he returns for followup of CHF and aortic valve disease.  He has had no further hematuria.  No palpitations.  He is in NSR today.  He does not get short of breath walking on flat ground but does get short of breath with stairs and inclines. No chest pain.  He does not want to do cardiac rehab due to his schedule (taking care of grandson).  Weight is up 3 lbs.     ECG (personally reviewed): NSR, 1st degree AVB, old ASMI.   Labs (2/15): K 4.4, creatinine 0.9, LDL 71, HDL 53 Labs (2/16): K 4.8, creatinine 1.0, LDL 62, HDL 57 Labs (8/16): K 4.7, creatinine 0.95, TSH normal, HCT 41.2,  LDL 78, HDL 58 Labs (8/17): K 4.3, creatinine 1.05, HCT 40.1, TSH normal, BNP 56 Labs (9/17): creatinine 1.08, BNP 56 Labs (7/18): creatinine 1.2, LDL 53 Labs (7/19): LDL 68 Labs (8/19): K 5, creatinine 1.09 Labs (8/20): K 4.9, creatinine 1.09, LDL 59, HDL 50 Labs (8/21): K 4.6, creatinine 1.0 Labs (1/22): K 4, creatinine 1.02, LDL 70 Labs (9/22): BNP 18, K 3.8, creatinine 0.94 Labs (3/23): LDL 61, HDL 48 Labs (6/23): K 4.1, creatinine 1.0, hgb 8.3, Lp(a) 116  PMH: 1. OSA: Using CPAP.  2. CAD: Cardiolite 8/12 with inferior infarct and peri-infarct ischemia, had PCI to the mid  and distal RCA with Promus DES x 2.  Lexiscan Cardiolite (2/16) with no ischemia or infarction. LHC/RHC (2/16) with mean RA 12, PA 32/15, mean PCWP 18, CI 3.47; patent mid and distal RCA stents, 50-60% proximal stenosis small PDA.   - LHC/RHC (9/17): 60% proximal PDA; mean RA 2, PA 24/8, mean PCWP 18, CI 2.13.  - Cardiolite (8/17): EF 56%, normal.  - R/LHC (3/23, AVR work up): mid RCA lesion 40% stenosis, previously placed prox RCA to Mid RCA stent (unknown type) widely patent, previously placed dist RCA stent (unknown type) widely patent; mean RA 13, PA 23/8, mean PCWP 11, CI 2.65.  3. Carotid stenosis: 2/15 carotid dopplers 1-39% bilateral ICA stenosis.  4. Colon polyps 5. HTN 6. Hyperlipidemia 7. Abdominal US (10/12) with no AAA 8. Aortic stenosis: Echo (2/15) with EF 60-65%, mild LVH, moderate diastolic dysfunction, normal RV size/systolic function, moderate aortic stenosis with mean gradient 31 and AVA 1.16 cm2, ascending aorta 4.4 cm.  Echo (2/16) with EF 60-65%, grade II diastolic dysfunction, ?bicuspid aortic valve with moderate AS (mean gradient 35 mmHg, AVA 1.5 cm^2), mildly dilated RV with normal systolic function.  TEE (2/16) with bicuspid aortic valve, moderate AS mean gradient 30 mmHg, AVA > 1 cm^2, EF 55-60%, mild LVH, mildly dilated RV with normal systolic function, mild AI, mild MR, ascending aorta 4.2 cm.  - Echo (8/17): EF 60-65%, grade II diastolic dysfunction, bicuspid aortic valve with mean gradient 31 mmHg, probably moderate stenosis.  - TEE (9/17): EF 55-60%, bicuspid aortic valve with moderate AS with mean gradient 24 mmHg and AVA 1.05 cm^2, ascending aorta 4.2 cm.  - Echo (9/18): EF 60-65%, bicuspid aortic valve, AVA 1.05 cm^2 with mean gradient 24 mmHg (moderate AS).  - Echo (8/20): EF 60-65%, mild RV dilation with normal systolic function, moderate AS with AVA 1.46 cm^2 and mean gradient 36 mmHg, 4.5 cm ascending aorta.  - Echo (1/22): EF 60-65%, mild LVH, moderate AS with  mean gradient 32 and AVA 1.6 cm^2, bicuspid aortic valve, ascending aorta 46 mm.  - Echo (3/23): EF 60-65% with mild LVH, normal RV, bicuspid aortic valve with severe AS and mean gradient 48 mmHg, AVA 0.91 cm^2. 4.5 cm ascending aorta.  - s/p bioprosthetic AVR with a 25 mm Edwards Resilia Aortic Valve and Replacement of Ascending Aorta (5/23). - Echo (7/23): EF 60-65%, normal RV, stable bioprosthetic aortic valve.  9. Ascending aorta aneurysm: 4.4 cm on 2/15 echo. Ascending aorta 4.2 cm TEE 2/16.  Ascending aorta 4.2 cm on 9/17 TEE.  - CTA chest (9/18): 4.5 cm ascending aorta.  - CTA chest (8/19): 4.5 cm ascending aorta - CTA chest (8/20): 4.6 cm ascending aorta - CTA chest (8/22): 4.4 cm ascending aorta - replacement of ascending aorta 5/23. 10. Chronic diastolic CHF.  11. PFTs (4/16) were within normal limits.  12. Lower extremity  arterial dopplers normal in 8/17.  13. Bladder cancer: S/p chemo and TURBT 14. Pericardial tamponade with pericardial window s/p AVR in 5/23.  15. Atrial fibrillation: Paroxysmal, noted post-op AVR.   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  Sister with RyR2 gene.   ROS: All systems reviewed and negative except as per HPI.   Current Outpatient Medications  Medication Sig Dispense Refill   acetaminophen (TYLENOL) 500 MG tablet Take 1-2 tablets (500-1,000 mg total) by mouth every 6 (six) hours as needed for moderate pain. (Patient taking differently: Take 1,000 mg by mouth every 6 (six) hours as needed for moderate pain.) 30 tablet 0   aspirin 81 MG tablet Take 81 mg by mouth every morning.     carvedilol (COREG) 3.125 MG tablet Take 1 tablet (3.125 mg total) by mouth 2 (two) times daily with a meal. 60 tablet 3   citalopram (CELEXA) 40 MG tablet Take 1 tablet by mouth daily.     empagliflozin (JARDIANCE) 10 MG TABS tablet Take 1 tablet (10 mg total) by mouth daily before breakfast. 30 tablet 11   Ferrous Sulfate (IRON  PO) Take by mouth. 1 tab daily     ipratropium (ATROVENT) 0.03 % nasal spray Place 2 sprays into both nostrils every 12 (twelve) hours as needed for rhinitis.     KLOR-CON M20 20 MEQ tablet TAKE 2 TABLETS BY MOUTH DAILY 180 tablet 1   Magnesium 500 MG TABS Take 500 mg by mouth daily.     meclizine (ANTIVERT) 25 MG tablet Take 1 tablet (25 mg total) by mouth 3 (three) times daily as needed for dizziness. 30 tablet 0   multivitamin-iron-minerals-folic acid (CENTRUM) chewable tablet Chew 1 tablet by mouth daily.     rosuvastatin (CRESTOR) 20 MG tablet TAKE 1 TABLET BY MOUTH EVERYDAY AT BEDTIME 90 tablet 3   tamsulosin (FLOMAX) 0.4 MG CAPS capsule Take 0.4 mg by mouth daily.     venlafaxine XR (EFFEXOR-XR) 150 MG 24 hr capsule TAKE 1 CAPSULE BY MOUTH DAILY WITH BREAKFAST. 90 capsule 0   furosemide (LASIX) 20 MG tablet Take 2 tablets (40 mg total) by mouth every morning AND 1 tablet (20 mg total) every evening. 90 tablet 3   traMADol (ULTRAM) 50 MG tablet Take 1 tablet (50 mg total) by mouth every 6 (six) hours as needed for moderate pain. (Patient not taking: Reported on 01/17/2022) 14 tablet 0   No current facility-administered medications for this encounter.   Wt Readings from Last 3 Encounters:  01/17/22 126.4 kg (278 lb 9.6 oz)  11/24/21 121.6 kg (268 lb)  11/04/21 121.4 kg (267 lb 9.6 oz)   BP 130/70   Pulse 64   Wt 126.4 kg (278 lb 9.6 oz)   SpO2 96%   BMI 44.97 kg/m  General: NAD Neck: JVP 8-9 cm, no thyromegaly or thyroid nodule.  Lungs: Clear to auscultation bilaterally with normal respiratory effort. CV: Nondisplaced PMI.  Heart regular S1/S2, no S3/S4, 2/6 SEM RUSB.  1+ ankle edema.  No carotid bruit.  Normal pedal pulses.  Abdomen: Soft, nontender, no hepatosplenomegaly, no distention.  Skin: Intact without lesions or rashes.  Neurologic: Alert and oriented x 3.  Psych: Normal affect. Extremities: No clubbing or cyanosis.  HEENT: Normal.   Assessment/Plan: 1. CAD: Prior  PCI to RCA in 8/12.  LHC (9/17) with nonobstructive disease. He was started on Imdur for ?microvascular angina but he developed severe headaches without any  improvement in symptoms.  Underwent coronary angiography (3/23) as part of AVR work up and showed patent RCA stents with nonobstructive mild disease. No chest pain now.  - Continue ASA 81 and Crestor.  2. Aortic stenosis: Bicuspid valve with severe AS on 3/23 echo.  He is now s/p bioprosthetic AV replacement and replacement of ascending aorta 5/23. Echo in 7/23 showed normal bioprosthetic aortic valve.  3. Hyperlipidemia: Good LDL in 3/23.  However, Lp(a) noted to be significantly elevated at 116.  - Recheck lipids today, if LDL > 70, will add Repatha give elevated Lp(a).  4. Ascending aortic aneurysm: Associated with bicuspid aortic valve.  4.4 cm on 8/22 CTA, 4.5 cm on echo today.  S/p replacement of ascending aorta 5/23. 5. Chronic diastolic CHF:  NYHA class III symptoms, he is volume overloaded on exam.  - Increase Lasix to 60 qam/40 qpm x 3 days then 40 mg bid after that.  BMET/BNP today and BMET in 10 days.  - Add Farxiga vs Jardiance 10 mg daily (whichever insurance prefers).  6. OSA: Continue CPAP.  7. HTN: BP controlled.  8. Pericardial effusion: s/p pericardial window 6/23 post-op.  9. Atrial fibrillation: Paroxysmal, only noted post-op.  Anticoagulation was stopped with pericardial effusion and later hematuria.  NSR today.  - If AF recurs, will need DOAC.  10. Anemia: post-op. Check CBC today.   Follow up in 1 month with APP.   [name]  01/18/2022

## 2022-02-01 ENCOUNTER — Ambulatory Visit (HOSPITAL_COMMUNITY)
Admission: RE | Admit: 2022-02-01 | Discharge: 2022-02-01 | Disposition: A | Discharge status: Home / Self Care | Payer: PPO | Source: Ambulatory Visit | Attending: Cardiology | Admitting: Cardiology

## 2022-02-01 DIAGNOSIS — I5032 Chronic diastolic (congestive) heart failure: Secondary | ICD-10-CM | POA: Insufficient documentation

## 2022-02-01 LAB — BASIC METABOLIC PANEL
Anion gap: 8 (ref 5–15)
BUN: 19 mg/dL (ref 8–23)
CO2: 29 mmol/L (ref 22–32)
Calcium: 8.8 mg/dL — ABNORMAL LOW (ref 8.9–10.3)
Chloride: 101 mmol/L (ref 98–111)
Creatinine, Ser: 1.34 mg/dL — ABNORMAL HIGH (ref 0.61–1.24)
GFR, Estimated: 58 mL/min — ABNORMAL LOW (ref >60)
Glucose, Bld: 118 mg/dL — ABNORMAL HIGH (ref 70–99)
Potassium: 4 mmol/L (ref 3.5–5.1)
Sodium: 138 mmol/L (ref 135–145)

## 2022-02-09 ENCOUNTER — Other Ambulatory Visit (HOSPITAL_COMMUNITY): Payer: Self-pay | Admitting: Cardiology

## 2022-02-12 ENCOUNTER — Other Ambulatory Visit: Payer: Self-pay | Admitting: Family Medicine

## 2022-02-12 DIAGNOSIS — F411 Generalized anxiety disorder: Secondary | ICD-10-CM

## 2022-02-14 DIAGNOSIS — C678 Malignant neoplasm of overlapping sites of bladder: Secondary | ICD-10-CM | POA: Diagnosis not present

## 2022-02-14 DIAGNOSIS — R338 Other retention of urine: Secondary | ICD-10-CM | POA: Diagnosis not present

## 2022-02-15 ENCOUNTER — Encounter (HOSPITAL_COMMUNITY): Payer: Self-pay

## 2022-02-15 ENCOUNTER — Ambulatory Visit (HOSPITAL_COMMUNITY)
Admission: RE | Admit: 2022-02-15 | Discharge: 2022-02-15 | Disposition: A | Discharge status: Home / Self Care | Payer: PPO | Source: Ambulatory Visit | Attending: Family Medicine | Admitting: Family Medicine

## 2022-02-15 VITALS — BP 120/70 | HR 67 | Wt 276.6 lb

## 2022-02-15 DIAGNOSIS — I5032 Chronic diastolic (congestive) heart failure: Secondary | ICD-10-CM | POA: Insufficient documentation

## 2022-02-15 DIAGNOSIS — Z953 Presence of xenogenic heart valve: Secondary | ICD-10-CM | POA: Insufficient documentation

## 2022-02-15 DIAGNOSIS — Z952 Presence of prosthetic heart valve: Secondary | ICD-10-CM | POA: Diagnosis not present

## 2022-02-15 DIAGNOSIS — I480 Paroxysmal atrial fibrillation: Secondary | ICD-10-CM | POA: Diagnosis not present

## 2022-02-15 DIAGNOSIS — R0602 Shortness of breath: Secondary | ICD-10-CM | POA: Insufficient documentation

## 2022-02-15 DIAGNOSIS — I25119 Atherosclerotic heart disease of native coronary artery with unspecified angina pectoris: Secondary | ICD-10-CM | POA: Insufficient documentation

## 2022-02-15 DIAGNOSIS — G4733 Obstructive sleep apnea (adult) (pediatric): Secondary | ICD-10-CM | POA: Insufficient documentation

## 2022-02-15 DIAGNOSIS — I110 Hypertensive heart disease with heart failure: Secondary | ICD-10-CM | POA: Insufficient documentation

## 2022-02-15 DIAGNOSIS — C679 Malignant neoplasm of bladder, unspecified: Secondary | ICD-10-CM | POA: Insufficient documentation

## 2022-02-15 DIAGNOSIS — Z7984 Long term (current) use of oral hypoglycemic drugs: Secondary | ICD-10-CM | POA: Diagnosis not present

## 2022-02-15 DIAGNOSIS — D649 Anemia, unspecified: Secondary | ICD-10-CM | POA: Diagnosis not present

## 2022-02-15 DIAGNOSIS — I3139 Other pericardial effusion (noninflammatory): Secondary | ICD-10-CM | POA: Insufficient documentation

## 2022-02-15 DIAGNOSIS — F419 Anxiety disorder, unspecified: Secondary | ICD-10-CM | POA: Diagnosis not present

## 2022-02-15 DIAGNOSIS — E785 Hyperlipidemia, unspecified: Secondary | ICD-10-CM | POA: Insufficient documentation

## 2022-02-15 DIAGNOSIS — Z7901 Long term (current) use of anticoagulants: Secondary | ICD-10-CM | POA: Insufficient documentation

## 2022-02-15 DIAGNOSIS — I350 Nonrheumatic aortic (valve) stenosis: Secondary | ICD-10-CM | POA: Insufficient documentation

## 2022-02-15 DIAGNOSIS — Z955 Presence of coronary angioplasty implant and graft: Secondary | ICD-10-CM | POA: Diagnosis not present

## 2022-02-15 DIAGNOSIS — I10 Essential (primary) hypertension: Secondary | ICD-10-CM

## 2022-02-15 DIAGNOSIS — E782 Mixed hyperlipidemia: Secondary | ICD-10-CM | POA: Diagnosis not present

## 2022-02-15 DIAGNOSIS — I7121 Aneurysm of the ascending aorta, without rupture: Secondary | ICD-10-CM | POA: Insufficient documentation

## 2022-02-15 DIAGNOSIS — I2510 Atherosclerotic heart disease of native coronary artery without angina pectoris: Secondary | ICD-10-CM | POA: Diagnosis not present

## 2022-02-15 LAB — BASIC METABOLIC PANEL
Anion gap: 8 (ref 5–15)
BUN: 16 mg/dL (ref 8–23)
CO2: 29 mmol/L (ref 22–32)
Calcium: 9.1 mg/dL (ref 8.9–10.3)
Chloride: 103 mmol/L (ref 98–111)
Creatinine, Ser: 1.28 mg/dL — ABNORMAL HIGH (ref 0.61–1.24)
GFR, Estimated: >60 mL/min (ref >60)
Glucose, Bld: 133 mg/dL — ABNORMAL HIGH (ref 70–99)
Potassium: 4.5 mmol/L (ref 3.5–5.1)
Sodium: 140 mmol/L (ref 135–145)

## 2022-02-15 LAB — BRAIN NATRIURETIC PEPTIDE: B Natriuretic Peptide: 32 pg/mL (ref 0.0–100.0)

## 2022-02-15 MED ORDER — ROSUVASTATIN CALCIUM 20 MG PO TABS: ORAL_TABLET | ORAL | 3 refills | Status: DC

## 2022-02-15 MED ORDER — FUROSEMIDE 20 MG PO TABS: ORAL_TABLET | ORAL | 6 refills | Status: DC

## 2022-02-15 MED ORDER — CARVEDILOL 3.125 MG PO TABS: ORAL_TABLET | ORAL | 1 refills | Status: DC

## 2022-02-15 NOTE — Patient Instructions (Signed)
DECREASE Lasix to 60 mg in the AM and 40 mg in the PM  Labs today We will only contact you if something comes back abnormal or we need to make some changes. Otherwise no news is good news!  Your physician wants you to follow-up in: 3-4 months with Dr Kendall Flack will receive a reminder letter in the mail two months in advance. If you don't receive a letter, please call our office to schedule the follow-up appointment.   Do the following things EVERYDAY: Weigh yourself in the morning before breakfast. Write it down and keep it in a log. Take your medicines as prescribed Eat low salt foods--Limit salt (sodium) to 2000 mg per day.  Stay as active as you can everyday Limit all fluids for the day to less than 2 liters   At the [facility], you and your health needs are our priority. As part of our continuing mission to provide you with exceptional heart care, we have created designated Provider Care Teams. These Care Teams include your primary Cardiologist (physician) and Advanced Practice Providers (APPs- Physician Assistants and Nurse Practitioners) who all work together to provide you with the care you need, when you need it.   You may see any of the following providers on your designated Care Team at your next follow up: Dr Glori Bickers Dr [name] Dr. Roxana Hires, NP Lyda Jester, [place] [facility] [place], [place] Forestine Na, NP Audry Riles, PharmD   Please be sure to bring in all your medications bottles to every appointment.   If you have any questions or concerns before your next appointment please send [place] a message through [place] or call our office at [phone].    TO LEAVE A MESSAGE FOR THE NURSE SELECT OPTION 2, PLEASE LEAVE A MESSAGE INCLUDING: YOUR NAME DATE OF BIRTH CALL BACK NUMBER REASON FOR CALL**this is important as we prioritize the call backs  YOU WILL RECEIVE A CALL BACK THE SAME DAY AS LONG AS YOU CALL  BEFORE 4:00 PM

## 2022-02-15 NOTE — Progress Notes (Signed)
Patient ID: Luis [name], male   DOB: Mar 08, 1956, 66 y.o.   MRN: [id number] PCP: Dr. Sarajane Jews Cardiology: Dr. Aundra Dubin  66 y.o. with history of CAD and moderate aortic stenosis presents for follow of AS and CAD. He had RCA PCI in 8/12.     When I saw him in early 2016, he reported significant exertional dyspnea.  I had him do a Lexiscan Cardiolite in 2/16.  This showed no ischemia or infarction.  I also had him get an echo that showed ?bicuspid aortic valve and probably moderate aortic stenosis.  Given ongoing symptoms, I had him do a RHC/LHC and TEE in 2/16.  The RHC showed mildly elevated left and right heart filling pressures and the LHC showed nonobstructive CAD.  TEE confirmed bicuspid aortic valve with moderate AS.  He had PFTs in 4/16 that were within normal limits.  I started him on Lasix 20 mg daily.  He seemed to improve.    Progressive dypnea in fall 2017.  RHC/LHC in 2017 showed mildly elevated left heart filling pressure and nonobstructive CAD.  TEE showed moderate aortic stenosis, bicuspid valve.  Stable ascending aorta dilation.   Echo in 9/18 showed EF 60-65% with bicuspid aortic valve and moderate AS.  CTA chest showed 4.5 cm ascending aorta.  Repeat CTA chest in 8/19 showed stable 4.5 cm ascending aorta.  Echo in 8/20 showed EF 60-65%, moderate AS, 4.5 cm ascending aorta. CTA chest in 8/20 with 4.6 cm ascending aorta. Echo in 1/22 showed EF 60-65%, mild LVH, moderate AS with mean gradient 32 and AVA 1.6 cm^2, bicuspid aortic valve, ascending aorta 46 mm.   He is being treated for bladder cancer with chemotherapy and TURBT.   CTA chest in 8/22 with 4.4 cm ascending aorta.   Echo 3/23 showed EF 60-65% with mild LVH, normal RV, bicuspid aortic valve with severe AS and mean gradient 48 mmHg, AVA 0.91 cm^2.    Follow up 3/23, continued with exertional dyspnea. Referred to [facility] for evaluation of AVR, arranged for [facility] as part of work up. R/LHC (3/23) showed mild CAD, patent RCA stents,  normal PCWP, mildly elevated RA pressure, preserved CO.  Underwent AV replacement and replacement of ascending aorta with Dr. Cyndia Bent 5/23. Had post-op atrial fibrillation, started on amiodarone/warfarin. Presented back to ED 1 week after discharge with LOC. Echo showed moderate pericardial effusion with maximal diameter of 3 cm over the right ventricle with some compression, LV and aortic valve function normal. Attempted pericardiocentesis in cath lab, but unsuccessful and placed sub-xiphoid pericardial window in OR, with 850 cc fluid removed. Discharged home, weight 275 lbs.  Post-op echo in 7/23 showed EF 60-65%, normal RV, stable bioprosthetic aortic valve.   He had hematuria and found to have likely recurrence of bladder cancer.   Follow up 9/23, NYHA III symptoms and volume overloaded. SGLT2i added and Lasix increased x 3 days to 60/40 then back to 40 bid.  Today he returns for HF follow up. Overall feeling fine. He has shortness of breath walking up inclines but no issues walking on flat ground. Tries to stay active watching his 88 year old grandson. He misunderstood previous Lasix instructions, and has been taking 80 bid. Denies palpitations, CP, dizziness, edema, or PND/Orthopnea. Appetite ok. No fever or chills. Weight at home 273 pounds. Taking all medications. Wears CPAP at night.   ECG (personally reviewed): none ordered today.  Labs (2/15): K 4.4, creatinine 0.9, LDL 71, HDL 53 Labs (2/16): K 4.8, creatinine  1.0, LDL 62, HDL 57 Labs (8/16): K 4.7, creatinine 0.95, TSH normal, HCT 41.2, LDL 78, HDL 58 Labs (8/17): K 4.3, creatinine 1.05, HCT 40.1, TSH normal, BNP 56 Labs (9/17): creatinine 1.08, BNP 56 Labs (7/18): creatinine 1.2, LDL 53 Labs (7/19): LDL 68 Labs (8/19): K 5, creatinine 1.09 Labs (8/20): K 4.9, creatinine 1.09, LDL 59, HDL 50 Labs (8/21): K 4.6, creatinine 1.0 Labs (1/22): K 4, creatinine 1.02, LDL 70 Labs (9/22): BNP 18, K 3.8, creatinine 0.94 Labs (3/23): LDL  61, HDL 48 Labs (6/23): K 4.1, creatinine 1.0, hgb 8.3, Lp(a) 116 Labs (10/23): K 4.0, creatinine 1.34, LDL 71  PMH: 1. OSA: Using CPAP.  2. CAD: Cardiolite 8/12 with inferior infarct and peri-infarct ischemia, had PCI to the mid and distal RCA with Promus DES x 2.  Lexiscan Cardiolite (2/16) with no ischemia or infarction. LHC/RHC (2/16) with mean RA 12, PA 32/15, mean PCWP 18, CI 3.47; patent mid and distal RCA stents, 50-60% proximal stenosis small PDA.   - LHC/RHC (9/17): 60% proximal PDA; mean RA 2, PA 24/8, mean PCWP 18, CI 2.13.  - Cardiolite (8/17): EF 56%, normal.  - R/LHC (3/23, AVR work up): mid RCA lesion 40% stenosis, previously placed prox RCA to Mid RCA stent (unknown type) widely patent, previously placed dist RCA stent (unknown type) widely patent; mean RA 13, PA 23/8, mean PCWP 11, CI 2.65.  3. Carotid stenosis: 2/15 carotid dopplers 1-39% bilateral ICA stenosis.  4. Colon polyps 5. HTN 6. Hyperlipidemia 7. Abdominal US (10/12) with no AAA 8. Aortic stenosis: Echo (2/15) with EF 60-65%, mild LVH, moderate diastolic dysfunction, normal RV size/systolic function, moderate aortic stenosis with mean gradient 31 and AVA 1.16 cm2, ascending aorta 4.4 cm.  Echo (2/16) with EF 60-65%, grade II diastolic dysfunction, ?bicuspid aortic valve with moderate AS (mean gradient 35 mmHg, AVA 1.5 cm^2), mildly dilated RV with normal systolic function.  TEE (2/16) with bicuspid aortic valve, moderate AS mean gradient 30 mmHg, AVA > 1 cm^2, EF 55-60%, mild LVH, mildly dilated RV with normal systolic function, mild AI, mild MR, ascending aorta 4.2 cm.  - Echo (8/17): EF 60-65%, grade II diastolic dysfunction, bicuspid aortic valve with mean gradient 31 mmHg, probably moderate stenosis.  - TEE (9/17): EF 55-60%, bicuspid aortic valve with moderate AS with mean gradient 24 mmHg and AVA 1.05 cm^2, ascending aorta 4.2 cm.  - Echo (9/18): EF 60-65%, bicuspid aortic valve, AVA 1.05 cm^2 with mean gradient  24 mmHg (moderate AS).  - Echo (8/20): EF 60-65%, mild RV dilation with normal systolic function, moderate AS with AVA 1.46 cm^2 and mean gradient 36 mmHg, 4.5 cm ascending aorta.  - Echo (1/22): EF 60-65%, mild LVH, moderate AS with mean gradient 32 and AVA 1.6 cm^2, bicuspid aortic valve, ascending aorta 46 mm.  - Echo (3/23): EF 60-65% with mild LVH, normal RV, bicuspid aortic valve with severe AS and mean gradient 48 mmHg, AVA 0.91 cm^2. 4.5 cm ascending aorta.  - s/p bioprosthetic AVR with a 25 mm Edwards Resilia Aortic Valve and Replacement of Ascending Aorta (5/23). - Echo (7/23): EF 60-65%, normal RV, stable bioprosthetic aortic valve.  9. Ascending aorta aneurysm: 4.4 cm on 2/15 echo. Ascending aorta 4.2 cm TEE 2/16.  Ascending aorta 4.2 cm on 9/17 TEE.  - CTA chest (9/18): 4.5 cm ascending aorta.  - CTA chest (8/19): 4.5 cm ascending aorta - CTA chest (8/20): 4.6 cm ascending aorta - CTA chest (8/22): 4.4 cm ascending  aorta - replacement of ascending aorta 5/23. 10. Chronic diastolic CHF.  11. PFTs (4/16) were within normal limits.  12. Lower extremity arterial dopplers normal in 8/17.  13. Bladder cancer: S/p chemo and TURBT 14. Pericardial tamponade with pericardial window s/p AVR in 5/23.  15. Atrial fibrillation: Paroxysmal, noted post-op AVR.   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  Sister with RyR2 gene.   ROS: All systems reviewed and negative except as per HPI.   Current Outpatient Medications  Medication Sig Dispense Refill   acetaminophen (TYLENOL) 500 MG tablet Take 1-2 tablets (500-1,000 mg total) by mouth every 6 (six) hours as needed for moderate pain. (Patient taking differently: Take 1,000 mg by mouth every 6 (six) hours as needed for moderate pain.) 30 tablet 0   aspirin 81 MG tablet Take 81 mg by mouth every morning.     carvedilol (COREG) 3.125 MG tablet TAKE 1 TABLET BY MOUTH TWICE A DAY WITH A MEAL 180 tablet 1    citalopram (CELEXA) 40 MG tablet Take 1 tablet by mouth daily.     empagliflozin (JARDIANCE) 10 MG TABS tablet Take 1 tablet (10 mg total) by mouth daily before breakfast. 30 tablet 11   Ferrous Sulfate (IRON PO) Take by mouth. 1 tab daily     furosemide (LASIX) 20 MG tablet Patient takes 4 tablets in the morning by mouth and 4 tablets in the evening.     ipratropium (ATROVENT) 0.03 % nasal spray Place 2 sprays into both nostrils every 12 (twelve) hours as needed for rhinitis.     KLOR-CON M20 20 MEQ tablet TAKE 2 TABLETS BY MOUTH DAILY 180 tablet 1   Magnesium 500 MG TABS Take 500 mg by mouth daily.     meclizine (ANTIVERT) 25 MG tablet Take 1 tablet (25 mg total) by mouth 3 (three) times daily as needed for dizziness. 30 tablet 0   multivitamin-iron-minerals-folic acid (CENTRUM) chewable tablet Chew 1 tablet by mouth daily.     rosuvastatin (CRESTOR) 20 MG tablet TAKE 1 TABLET BY MOUTH EVERYDAY AT BEDTIME 90 tablet 3   tamsulosin (FLOMAX) 0.4 MG CAPS capsule Take 0.4 mg by mouth daily.     traMADol (ULTRAM) 50 MG tablet Take 1 tablet (50 mg total) by mouth every 6 (six) hours as needed for moderate pain. 14 tablet 0   venlafaxine XR (EFFEXOR-XR) 150 MG 24 hr capsule TAKE 1 CAPSULE BY MOUTH DAILY WITH BREAKFAST. 90 capsule 0   No current facility-administered medications for this encounter.   Wt Readings from Last 3 Encounters:  02/15/22 125.5 kg (276 lb 9.6 oz)  01/17/22 126.4 kg (278 lb 9.6 oz)  11/24/21 121.6 kg (268 lb)   BP 120/70   Pulse 67   Wt 125.5 kg (276 lb 9.6 oz)   SpO2 98%   BMI 44.64 kg/m  Physical Exam General:  NAD. No resp difficulty, walked into clinic HEENT: Normal Neck: Supple. No JVD, thick neck. Carotids 2+ bilat; no bruits. No lymphadenopathy or thryomegaly appreciated. Cor: PMI nondisplaced. Regular rate & rhythm. No rubs, gallops, 2/6 SEM RUSB Lungs: Clear Abdomen: Soft, obese, nontender, nondistended. No hepatosplenomegaly. No bruits or masses. Good bowel  sounds. Extremities: No cyanosis, clubbing, rash, trace pedal edema Neuro: Alert & oriented x 3, cranial nerves grossly intact. Moves all 4 extremities w/o difficulty. Affect pleasant.  Assessment/Plan: 1. CAD: Prior PCI to RCA in 8/12.  LHC (9/17) with nonobstructive disease. He was  started on Imdur for ?microvascular angina but he developed severe headaches without any improvement in symptoms.  Underwent coronary angiography (3/23) as part of AVR work up and showed patent RCA stents with nonobstructive mild disease. No chest pain now.  - Continue ASA 81 and Crestor.  2. Aortic stenosis: Bicuspid valve with severe AS on 3/23 echo.  He is now s/p bioprosthetic AV replacement and replacement of ascending aorta 5/23. Echo in 7/23 showed normal bioprosthetic aortic valve.  3. Hyperlipidemia: Good LDL in 3/23.  However, Lp(a) noted to be significantly elevated at 116.  - Repeat LDL 71 9/23. - Continue Crestor. 4. Ascending aortic aneurysm: Associated with bicuspid aortic valve.  4.4 cm on 8/22 CTA, 4.5 cm on echo today.  S/p replacement of ascending aorta 5/23. 5. Chronic diastolic CHF:  Improved NYHA class II symptoms, he is not volume overloaded on exam.  - Decrease Lasix to 60 mg q am/40 mg q pm. BMET and BNP today. - Continue Jardiance 10 mg daily. No GU symptoms. - Continue carvedilol 3.125 mg bid.  6. OSA: Continue CPAP.  7. HTN: BP controlled.  8. Pericardial effusion: s/p pericardial window 6/23 post-op.  9. Atrial fibrillation: Paroxysmal, only noted post-op.  Anticoagulation was stopped with pericardial effusion and later hematuria.  Regular on exam today.  - If AF recurs, will need DOAC.  10. Anemia: post-op. Hgb 12.4 on 9/23 11. Bladder CA: No hematuria. Follows with Dr. Tresa Moore.  Follow up in 3-4 months with Dr. Aundra Dubin.  Maricela Bo [facility] FNP-BC 02/15/2022

## 2022-02-28 DIAGNOSIS — G4733 Obstructive sleep apnea (adult) (pediatric): Secondary | ICD-10-CM | POA: Diagnosis not present

## 2022-03-02 ENCOUNTER — Other Ambulatory Visit: Payer: Self-pay | Admitting: Family Medicine

## 2022-03-02 DIAGNOSIS — F411 Generalized anxiety disorder: Secondary | ICD-10-CM

## 2022-03-24 DIAGNOSIS — M722 Plantar fascial fibromatosis: Secondary | ICD-10-CM | POA: Diagnosis not present

## 2022-03-24 DIAGNOSIS — M216X9 Other acquired deformities of unspecified foot: Secondary | ICD-10-CM | POA: Diagnosis not present

## 2022-03-24 DIAGNOSIS — M79672 Pain in left foot: Secondary | ICD-10-CM | POA: Diagnosis not present

## 2022-03-28 ENCOUNTER — Other Ambulatory Visit (HOSPITAL_COMMUNITY): Payer: Self-pay | Admitting: Cardiology

## 2022-05-16 DIAGNOSIS — R310 Gross hematuria: Secondary | ICD-10-CM | POA: Diagnosis not present

## 2022-05-16 DIAGNOSIS — R338 Other retention of urine: Secondary | ICD-10-CM | POA: Diagnosis not present

## 2022-05-16 DIAGNOSIS — C678 Malignant neoplasm of overlapping sites of bladder: Secondary | ICD-10-CM | POA: Diagnosis not present

## 2022-05-18 ENCOUNTER — Other Ambulatory Visit: Payer: Self-pay | Admitting: Family Medicine

## 2022-05-18 DIAGNOSIS — F411 Generalized anxiety disorder: Secondary | ICD-10-CM

## 2022-05-18 MED ORDER — VENLAFAXINE HCL ER 150 MG PO CP24: 150.0000 mg | ORAL_CAPSULE | Freq: Every day | ORAL | 0 refills | Status: DC

## 2022-05-18 NOTE — Telephone Encounter (Signed)
Refill sent.

## 2022-05-18 NOTE — Telephone Encounter (Signed)
Patient scheduled an OV for 05/23/22  has 2 tablets remaining

## 2022-05-23 ENCOUNTER — Ambulatory Visit (INDEPENDENT_AMBULATORY_CARE_PROVIDER_SITE_OTHER): Payer: PPO | Admitting: Family Medicine

## 2022-05-23 ENCOUNTER — Other Ambulatory Visit: Payer: Self-pay | Admitting: Urology

## 2022-05-23 ENCOUNTER — Encounter: Payer: Self-pay | Admitting: Family Medicine

## 2022-05-23 VITALS — BP 128/76 | HR 61 | Temp 97.9°F | Wt 285.0 lb

## 2022-05-23 DIAGNOSIS — F411 Generalized anxiety disorder: Secondary | ICD-10-CM

## 2022-05-23 MED ORDER — DIAZEPAM 5 MG PO TABS: 5.0000 mg | ORAL_TABLET | Freq: Two times a day (BID) | ORAL | 2 refills | Status: DC | PRN

## 2022-05-23 NOTE — Progress Notes (Signed)
   Subjective:    Patient ID: Luis [name], male    DOB: [birth year], 67 y.o.   MRN: [id number]  HPI Here to follow up on anxiety. He has been dealing with a lot of issues in the past year including recurrent bladder tumors, PAF, and pericardial effusions. His anxiety has been a little worse lately so he asks for refill son Diazepam. He still takes Vanlefaxine daily, but he had not needed any Diazepam for over a year.    Review of Systems  Constitutional: Negative.   Respiratory: Negative.    Cardiovascular: Negative.   Psychiatric/Behavioral:  Negative for agitation, confusion, decreased concentration, dysphoric mood, hallucinations and sleep disturbance. The patient is nervous/anxious.        Objective:   Physical Exam Constitutional:      Appearance: Normal appearance.  Cardiovascular:     Rate and Rhythm: Normal rate and regular rhythm.     Pulses: Normal pulses.     Heart sounds: Normal heart sounds.  Pulmonary:     Effort: Pulmonary effort is normal.     Breath sounds: Normal breath sounds.  Neurological:     Mental Status: He is alert.  Psychiatric:        Mood and Affect: Mood normal.        Behavior: Behavior normal.        Thought Content: Thought content normal.           Assessment & Plan:  Anxiety. We refilled the Diazepam 5 mg to use as needed. He will follow up with Urology and Cardiology. [name], MD

## 2022-05-26 ENCOUNTER — Encounter (HOSPITAL_COMMUNITY): Payer: Self-pay

## 2022-05-26 ENCOUNTER — Encounter (HOSPITAL_COMMUNITY): Admission: RE | Admit: 2022-05-26 | Payer: PPO | Source: Ambulatory Visit

## 2022-05-26 NOTE — Patient Instructions (Addendum)
SURGICAL WAITING ROOM VISITATION Patients having surgery or a procedure may have no more than 2 support people in the waiting area - these visitors may rotate.    If the patient needs to stay at the hospital during part of their recovery, the visitor guidelines for inpatient rooms apply. Pre-op nurse will coordinate an appropriate time for 1 support person to accompany patient in pre-op.  This support person may not rotate.    Please refer to the [facility] website for the visitor guidelines for Inpatients (after your surgery is over and you are in a regular room).   Due to an increase in RSV and influenza rates and associated hospitalizations, children ages 87 and under may not visit patients in [place].     Your procedure is scheduled on: 06-03-22   Report to [facility] Main Entrance    Report to admitting at 12:45 PM   Call this number if you have problems the morning of surgery [phone]   Do not eat food or drink liquids :After Midnight.           If you have questions, please contact your surgeon's office.   FOLLOW  ANY ADDITIONAL PRE OP INSTRUCTIONS YOU RECEIVED FROM YOUR SURGEON'S OFFICE!!!     Oral Hygiene is also important to reduce your risk of infection.                                    Remember - BRUSH YOUR TEETH THE MORNING OF SURGERY WITH YOUR REGULAR TOOTHPASTE   Do NOT smoke after Midnight   Take these medicines the morning of surgery with A SIP OF WATER:   Venlafaxine  Carvedilol  Diazepam  Tamsulosin  Tylenol if needed  DO NOT TAKE ANY ORAL DIABETIC MEDICATIONS DAY OF YOUR SURGERY  Bring CPAP mask and tubing day of surgery.                              You may not have any metal on your body including jewelry, and body piercing             Do not wear lotions, powders, cologne, or deodorant              Men may shave face and neck.   Do not bring valuables to the hospital. [employer].   Contacts, dentures or bridgework may not be worn into surgery.   DO NOT [place]. PHARMACY WILL DISPENSE MEDICATIONS LISTED ON YOUR MEDICATION LIST TO YOU DURING YOUR ADMISSION [place]!    Patients discharged on the day of surgery will not be allowed to drive home.  Someone NEEDS to stay with you for the first 24 hours after anesthesia.   Special Instructions: Bring a copy of your healthcare power of attorney and living will documents the day of surgery if you haven't scanned them before.              Please read over the following fact sheets you were given: IF YOU HAVE QUESTIONS ABOUT YOUR PRE-OP INSTRUCTIONS PLEASE CALL [phone] [place]  If you received a COVID test during your pre-op visit  it is requested that you wear a mask when out in public, stay away from anyone that may not be feeling  well and notify your surgeon if you develop symptoms. If you test positive for Covid or have been in contact with anyone that has tested positive in the last 10 days please notify you surgeon.  [place] - Preparing for Surgery Before surgery, you can play an important role.  Because skin is not sterile, your skin needs to be as free of germs as possible.  You can reduce the number of germs on your skin by washing with CHG (chlorahexidine gluconate) soap before surgery.  CHG is an antiseptic cleaner which kills germs and bonds with the skin to continue killing germs even after washing. Please DO NOT use if you have an allergy to CHG or antibacterial soaps.  If your skin becomes reddened/irritated stop using the CHG and inform your nurse when you arrive at Short Stay. Do not shave (including legs and underarms) for at least 48 hours prior to the first CHG shower.  You may shave your face/neck.  Please follow these instructions carefully:  1.  Shower with CHG Soap the night before surgery and the  morning of surgery.  2.  If you choose to wash  your hair, wash your hair first as usual with your normal  shampoo.  3.  After you shampoo, rinse your hair and body thoroughly to remove the shampoo.                             4.  Use CHG as you would any other liquid soap.  You can apply chg directly to the skin and wash.  Gently with a scrungie or clean washcloth.  5.  Apply the CHG Soap to your body ONLY FROM THE NECK DOWN.   Do   not use on face/ open                           Wound or open sores. Avoid contact with eyes, ears mouth and   genitals (private parts).                       Wash face,  Genitals (private parts) with your normal soap.             6.  Wash thoroughly, paying special attention to the area where your    surgery  will be performed.  7.  Thoroughly rinse your body with warm water from the neck down.  8.  DO NOT shower/wash with your normal soap after using and rinsing off the CHG Soap.                9.  Pat yourself dry with a clean towel.            10.  Wear clean pajamas.            11.  Place clean sheets on your bed the night of your first shower and do not  sleep with pets. Day of Surgery : Do not apply any lotions/deodorants the morning of surgery.  Please wear clean clothes to the hospital/surgery center.  FAILURE TO FOLLOW THESE INSTRUCTIONS MAY RESULT IN THE CANCELLATION OF YOUR SURGERY  PATIENT SIGNATURE_________________________________  NURSE SIGNATURE__________________________________  ________________________________________________________________________

## 2022-05-27 NOTE — Progress Notes (Signed)
COVID Vaccine Completed:  Yes  Date of COVID positive in last 90 days:  PCP - [name], MD Cardiologist - Fransico Him, MD/[place] Aundra Dubin, MD Cardiothoracic - Gilford Raid, MD  Chest x-ray - 10-20-21 Epic EKG - 01-17-22 Epic Stress Test - 12-18-15 Epic ECHO - 10-29-21 Epic Cardiac Cath - 07-08-21 Epic Pacemaker/ICD device last checked: Spinal Cord Stimulator: Coronary CT - 08-06-21 Epic  Bowel Prep -   Sleep Study - Yes, +sleep apnea CPAP -   Fasting Blood Sugar -  Checks Blood Sugar _____ times a day  Last dose of GLP1 agonist-  N/A GLP1 instructions:  N/A   Jardiance for CHF Last dose of SGLT-2 inhibitors-  N/A SGLT-2 instructions: N/A   Blood Thinner Instructions: Aspirin Instructions:  ASA 81 Last Dose:  Activity level:  Can go up a flight of stairs and perform activities of daily living without stopping and without symptoms of chest pain or shortness of breath.  Able to exercise without symptoms  Unable to go up a flight of stairs without symptoms of     Anesthesia review:  Afib, CAD, Hx of MI,  aortic stenosis, carotid artery stenosis, CHF, HTN, AAA, OSA.  Poor R wave progression on EKG.  S/P AVR  Patient denies shortness of breath, fever, cough and chest pain at PAT appointment  Patient verbalized understanding of instructions that were given to them at the PAT appointment. Patient was also instructed that they will need to review over the PAT instructions again at home before surgery.

## 2022-05-30 ENCOUNTER — Encounter (HOSPITAL_COMMUNITY): Payer: Self-pay

## 2022-05-30 ENCOUNTER — Other Ambulatory Visit: Payer: Self-pay

## 2022-05-30 ENCOUNTER — Encounter (HOSPITAL_COMMUNITY)
Admission: RE | Admit: 2022-05-30 | Discharge: 2022-05-30 | Disposition: A | Discharge status: Home / Self Care | Payer: PPO | Source: Ambulatory Visit | Attending: Urology | Admitting: Urology

## 2022-05-30 VITALS — BP 151/89 | HR 61 | Temp 97.9°F | Resp 20 | Ht 66.0 in | Wt 282.0 lb

## 2022-05-30 DIAGNOSIS — G4730 Sleep apnea, unspecified: Secondary | ICD-10-CM | POA: Insufficient documentation

## 2022-05-30 DIAGNOSIS — I2510 Atherosclerotic heart disease of native coronary artery without angina pectoris: Secondary | ICD-10-CM | POA: Insufficient documentation

## 2022-05-30 DIAGNOSIS — I5032 Chronic diastolic (congestive) heart failure: Secondary | ICD-10-CM | POA: Diagnosis not present

## 2022-05-30 DIAGNOSIS — I110 Hypertensive heart disease with heart failure: Secondary | ICD-10-CM | POA: Diagnosis not present

## 2022-05-30 DIAGNOSIS — C679 Malignant neoplasm of bladder, unspecified: Secondary | ICD-10-CM | POA: Insufficient documentation

## 2022-05-30 DIAGNOSIS — I10 Essential (primary) hypertension: Secondary | ICD-10-CM | POA: Diagnosis not present

## 2022-05-30 DIAGNOSIS — Z01818 Encounter for other preprocedural examination: Secondary | ICD-10-CM | POA: Diagnosis not present

## 2022-05-30 DIAGNOSIS — E119 Type 2 diabetes mellitus without complications: Secondary | ICD-10-CM

## 2022-05-30 HISTORY — DX: Dyspnea, unspecified: R06.00

## 2022-05-30 HISTORY — DX: Prediabetes: R73.03

## 2022-05-30 LAB — BASIC METABOLIC PANEL
Anion gap: 11 (ref 5–15)
BUN: 21 mg/dL (ref 8–23)
CO2: 27 mmol/L (ref 22–32)
Calcium: 9.4 mg/dL (ref 8.9–10.3)
Chloride: 104 mmol/L (ref 98–111)
Creatinine, Ser: 1.04 mg/dL (ref 0.61–1.24)
GFR, Estimated: >60 mL/min (ref >60)
Glucose, Bld: 107 mg/dL — ABNORMAL HIGH (ref 70–99)
Potassium: 4.5 mmol/L (ref 3.5–5.1)
Sodium: 142 mmol/L (ref 135–145)

## 2022-05-30 LAB — CBC
HCT: 46.9 % (ref 39.0–52.0)
Hemoglobin: 14.9 g/dL (ref 13.0–17.0)
MCH: 29.2 pg (ref 26.0–34.0)
MCHC: 31.8 g/dL (ref 30.0–36.0)
MCV: 92 fL (ref 80.0–100.0)
Platelets: 150 10*3/uL (ref 150–400)
RBC: 5.1 MIL/uL (ref 4.22–5.81)
RDW: 14.6 % (ref 11.5–15.5)
WBC: 7.6 10*3/uL (ref 4.0–10.5)
nRBC: 0 % (ref 0.0–0.2)

## 2022-05-30 LAB — HEMOGLOBIN A1C
Hgb A1c MFr Bld: 5.9 % — ABNORMAL HIGH (ref 4.8–5.6)
Mean Plasma Glucose: 122.63 mg/dL

## 2022-05-30 LAB — GLUCOSE, CAPILLARY: Glucose-Capillary: 109 mg/dL — ABNORMAL HIGH (ref 70–99)

## 2022-05-31 ENCOUNTER — Telehealth (HOSPITAL_COMMUNITY): Payer: Self-pay

## 2022-05-31 NOTE — Progress Notes (Signed)
Anesthesia Chart Review   Case: [id number] Date/Time: 06/03/22 1445   Procedures:      TRANSURETHRAL RESECTION OF BLADDER TUMOR (TURBT)     CYSTOSCOPY WITH RETROGRADE PYELOGRAM (Bilateral)   Anesthesia type: General   Pre-op diagnosis: RECURRENT BLADDER CANCER   Location: [facility]OM 03 / [facility]   Surgeons: Alexis Frock, MD       DISCUSSION:67 y.o. former smoker with h/o sleep apnea, CAD (PCI to RCA in 8/12), CHF, a/p AV replacement 08/2021, S/p replacement of ascending aorta 5/23, post operative a-fib, recurrent bladder cancer scheduled for above procedure 06/03/2022 with Dr. Alexis Frock.   Last seen by cardiology 02/15/2022, stable at this visit with 3-4 month follow up recommended.  Cardiac clearance requested.  VS: BP (!) 151/89   Pulse 61   Temp 36.6 C (Oral)   Resp 20   Ht '5\' 6"'$  (1.676 m)   Wt 127.9 kg   SpO2 97%   BMI 45.52 kg/m   PROVIDERS: Laurey Morale, MD is PCP   Cardiologist - [name], MD  Cardiothoracic - Gilford Raid, MD LABS: Labs reviewed: Acceptable for surgery. (all labs ordered are listed, but only abnormal results are displayed)  Labs Reviewed  HEMOGLOBIN A1C - Abnormal; Notable for the following components:      Result Value   Hgb A1c MFr Bld 5.9 (*)    All other components within normal limits  BASIC METABOLIC PANEL - Abnormal; Notable for the following components:   Glucose, Bld 107 (*)    All other components within normal limits  GLUCOSE, CAPILLARY - Abnormal; Notable for the following components:   Glucose-Capillary 109 (*)    All other components within normal limits  CBC     IMAGES:   EKG:   CV: Echo 10/29/2021 1. Left ventricular ejection fraction, by estimation, is 60 to 65%. The  left ventricle has normal function. The left ventricle has no regional  wall motion abnormalities. Left ventricular diastolic parameters were  normal.   2. Right ventricular systolic function is normal. The right ventricular  size is normal.    3. Possible trivial pericardial effusion anterior to RV.   4. The mitral valve is normal in structure. Trivial mitral valve  regurgitation. No evidence of mitral stenosis.   5. The aortic valve is normal in structure. Aortic valve regurgitation is  not visualized. No aortic stenosis is present. There is a 25 mm Edwards  Inspiris Resilia valve present in the aortic position. Procedure Date:  09/16/21. Aortic valve mean gradient  measures 12.8 mmHg. Aortic valve Vmax measures 2.50 m/s.   6. Aortic root/ascending aorta has been repaired/replaced. There is mild  dilatation of the aortic root, measuring 38 mm.   7. The inferior vena cava is normal in size with greater than 50%  respiratory variability, suggesting right atrial pressure of 3 mmHg.  Past Medical History:  Diagnosis Date   Anxiety    takes Valium as needed   Aortic stenosis, moderate    Arthritis    Ascending aortic aneurysm (HCC)    CAD (coronary artery disease)    a. s/p PCI of the RCA 8/12 with DES by Dr Burt Knack, preserved EF. b. LHC/RHC (2/16) with mean RA 12, PA 32/15, mean PCWP 18, CI 3.47; patent mid and distal RCA stents, 50-60% proximal stenosis small PDA.      Cancer [facility])    bladder   Carotid stenosis    a. Carotid US (05/2013):  Bilateral 1-39% ICA; L  thyroid nodule (prior hx of aspiration).   Chronic diastolic CHF (congestive heart failure) (HCC)    Complication of anesthesia    difficulty waking up after gallbladder surgery   Depression    Dyspnea    Essential hypertension    GERD (gastroesophageal reflux disease)    if needed will take OTC meds    Heart murmur    History of colonic polyps    hyperplastic   Hyperlipidemia    Joint pain    Lesion of bladder    Myocardial infarction ([place]) 2012   Obesity (BMI 30-39.9) 02/29/2016   Pre-diabetes    Restless leg    Sleep apnea    uses cpap   Tubular adenoma of colon    Vertigo    takes Meclizine as needed    Past Surgical History:  Procedure  Laterality Date   AORTIC VALVE REPLACEMENT N/A 09/16/2021   Procedure: AORTIC VALVE REPLACEMENT (AVR);  Surgeon: Gaye Pollack, MD;  Location: [place];  Service: Open Heart Surgery;  Laterality: N/A;   CATARACT EXTRACTION   4 YRS AGO   BOTH EYES   CHOLECYSTECTOMY  07/21/2011   Procedure: LAPAROSCOPIC CHOLECYSTECTOMY WITH INTRAOPERATIVE CHOLANGIOGRAM;  Surgeon: Shann Medal, MD;  Location: [facility];  Service: General;  Laterality: N/A;   CORONARY ANGIOPLASTY  2012   2 stents   coronary stenting     s/p PCI of the RCA by Dr Burt Knack 8/12 with 2 promus stents   CYSTOSCOPY W/ RETROGRADES Bilateral 12/13/2019   Procedure: CYSTOSCOPY WITH RETROGRADE PYELOGRAM;  Surgeon: Alexis Frock, MD;  [facility];  Service: Urology;  Laterality: Bilateral;   CYSTOSCOPY W/ RETROGRADES Bilateral 10/14/2020   Procedure: CYSTOSCOPY WITH RETROGRADE PYELOGRAM;  Surgeon: Alexis Frock, MD;  Location: [facility];  Service: Urology;  Laterality: Bilateral;   CYSTOSCOPY W/ RETROGRADES Bilateral 12/16/2020   Procedure: CYSTOSCOPY WITH RETROGRADE PYELOGRAM;  Surgeon: Alexis Frock, MD;  Location: [facility];  Service: Urology;  Laterality: Bilateral;   LEFT AND RIGHT HEART CATHETERIZATION WITH CORONARY ANGIOGRAM N/A 06/23/2014   Procedure: LEFT AND RIGHT HEART CATHETERIZATION WITH CORONARY ANGIOGRAM;  Surgeon: Larey Dresser, MD;  Location: [facility] C[facility] LAB;  Service: Cardiovascular;  Laterality: N/A;   NECK SURGERY  03/23/09   per Dr. Lorin Mercy, cervical fusion    PERICARDIOCENTESIS N/A 09/28/2021   Procedure: PERICARDIOCENTESIS;  Surgeon: Jettie Booze, MD;  Location: [place] CV LAB;  Service: Cardiovascular;  Laterality: N/A;   REPLACEMENT ASCENDING AORTA N/A 09/16/2021   Procedure: REPLACEMENT ASCENDING AORTA WITH 30 X 10MM HEMASHIELD PLATINUM WOVEN DOUBLE VELOUR VASCULAR GRAFT;  Surgeon: Gaye Pollack, MD;  Location: [place];  Service: Open Heart Surgery;   Laterality: N/A;  CIRC ARREST   right elbow surgery     RIGHT HEART CATH AND CORONARY ANGIOGRAPHY N/A 07/08/2021   Procedure: RIGHT HEART CATH AND CORONARY ANGIOGRAPHY;  Surgeon: Larey Dresser, MD;  Location: [place] CV LAB;  Service: Cardiovascular;  Laterality: N/A;   solonscopy  05/23/08   per Dr. Wynona Luna hemorrhoids only, repeat in 5 years   SUBXYPHOID PERICARDIAL WINDOW N/A 09/28/2021   Procedure: SUBXYPHOID PERICARDIAL WINDOW;  Surgeon: Gaye Pollack, MD;  Location: [place];  Service: Thoracic;  Laterality: N/A;   TEE WITHOUT CARDIOVERSION N/A 06/23/2014   Procedure: TRANSESOPHAGEAL ECHOCARDIOGRAM (TEE);  Surgeon: Larey Dresser, MD;  Location: [place];  Service: Cardiovascular;  Laterality: N/A;   TEE WITHOUT CARDIOVERSION N/A 01/21/2016   Procedure:  TRANSESOPHAGEAL ECHOCARDIOGRAM (TEE);  Surgeon: Larey Dresser, MD;  Location: [place];  Service: Cardiovascular;  Laterality: N/A;   TEE WITHOUT CARDIOVERSION N/A 09/16/2021   Procedure: TRANSESOPHAGEAL ECHOCARDIOGRAM (TEE);  Surgeon: Gaye Pollack, MD;  Location: [place];  Service: Open Heart Surgery;  Laterality: N/A;   TEE WITHOUT CARDIOVERSION N/A 09/28/2021   Procedure: TRANSESOPHAGEAL ECHOCARDIOGRAM (TEE);  Surgeon: Gaye Pollack, MD;  Location: [facility] OR;  Service: Thoracic;  Laterality: N/A;   TONSILLECTOMY     TRANSURETHRAL RESECTION OF BLADDER TUMOR N/A 10/21/2019   Procedure: TRANSURETHRAL RESECTION OF BLADDER TUMOR (TURBT);  Surgeon: Kathie Rhodes, MD;  Location: [facility];  Service: Urology;  Laterality: N/A;   TRANSURETHRAL RESECTION OF BLADDER TUMOR N/A 12/13/2019   Procedure: TRANSURETHRAL RESECTION OF BLADDER TUMOR (TURBT);  Surgeon: Alexis Frock, MD;  Location: [facility];  Service: Urology;  Laterality: N/A;  1 HR   TRANSURETHRAL RESECTION OF BLADDER TUMOR N/A 10/14/2020   Procedure: TRANSURETHRAL RESECTION OF BLADDER TUMOR (TURBT);  Surgeon: Alexis Frock, MD;   Location: [facility];  Service: Urology;  Laterality: N/A;   TRANSURETHRAL RESECTION OF BLADDER TUMOR N/A 12/16/2020   Procedure: RESTAGING TRANSURETHRAL RESECTION OF BLADDER TUMOR (TURBT);  Surgeon: Alexis Frock, MD;  Location: [facility];  Service: Urology;  Laterality: N/A;    MEDICATIONS:  acetaminophen (TYLENOL) 500 MG tablet   aspirin 81 MG tablet   carvedilol (COREG) 3.125 MG tablet   diazepam (VALIUM) 5 MG tablet   empagliflozin (JARDIANCE) 10 MG TABS tablet   Ferrous Sulfate (IRON PO)   furosemide (LASIX) 20 MG tablet   ipratropium (ATROVENT) 0.03 % nasal spray   KLOR-CON M20 20 MEQ tablet   Magnesium 500 MG TABS   meclizine (ANTIVERT) 25 MG tablet   multivitamin-iron-minerals-folic acid (CENTRUM) chewable tablet   rosuvastatin (CRESTOR) 20 MG tablet   tamsulosin (FLOMAX) 0.4 MG CAPS capsule   traMADol (ULTRAM) 50 MG tablet   venlafaxine XR (EFFEXOR-XR) 150 MG 24 hr capsule   No current facility-administered medications for this encounter.    Konrad Felix Ward, PA-C [facility] [phone]

## 2022-05-31 NOTE — Telephone Encounter (Signed)
Salita with Dr. Zettie Pho office called and states patient is scheduled for surgery on Friday and needs cardiac clearance. She has faxed over a form as well, but they need this asap if it can be done today.  Her number is [phone], ext. [phone]

## 2022-06-01 DIAGNOSIS — G4733 Obstructive sleep apnea (adult) (pediatric): Secondary | ICD-10-CM | POA: Diagnosis not present

## 2022-06-02 NOTE — Telephone Encounter (Signed)
Received, placed on Dr. Oleh Genin desk for a signature.

## 2022-06-03 ENCOUNTER — Ambulatory Visit (HOSPITAL_COMMUNITY)
Admission: RE | Admit: 2022-06-03 | Discharge: 2022-06-03 | Disposition: A | Discharge status: Home / Self Care | Payer: PPO | Source: Ambulatory Visit | Attending: Urology | Admitting: Urology

## 2022-06-03 ENCOUNTER — Ambulatory Visit (HOSPITAL_COMMUNITY): Payer: PPO

## 2022-06-03 ENCOUNTER — Ambulatory Visit (HOSPITAL_BASED_OUTPATIENT_CLINIC_OR_DEPARTMENT_OTHER): Payer: PPO | Admitting: Anesthesiology

## 2022-06-03 ENCOUNTER — Ambulatory Visit (HOSPITAL_COMMUNITY): Payer: PPO | Admitting: Physician Assistant

## 2022-06-03 ENCOUNTER — Other Ambulatory Visit: Payer: Self-pay

## 2022-06-03 ENCOUNTER — Encounter (HOSPITAL_COMMUNITY)
Admission: RE | Disposition: A | Discharge status: Home / Self Care | Payer: Self-pay | Source: Ambulatory Visit | Attending: Urology

## 2022-06-03 ENCOUNTER — Encounter (HOSPITAL_COMMUNITY): Payer: Self-pay | Admitting: Urology

## 2022-06-03 DIAGNOSIS — G4730 Sleep apnea, unspecified: Secondary | ICD-10-CM | POA: Insufficient documentation

## 2022-06-03 DIAGNOSIS — Z7984 Long term (current) use of oral hypoglycemic drugs: Secondary | ICD-10-CM

## 2022-06-03 DIAGNOSIS — Z87891 Personal history of nicotine dependence: Secondary | ICD-10-CM

## 2022-06-03 DIAGNOSIS — I5032 Chronic diastolic (congestive) heart failure: Secondary | ICD-10-CM | POA: Insufficient documentation

## 2022-06-03 DIAGNOSIS — I252 Old myocardial infarction: Secondary | ICD-10-CM

## 2022-06-03 DIAGNOSIS — C679 Malignant neoplasm of bladder, unspecified: Secondary | ICD-10-CM | POA: Insufficient documentation

## 2022-06-03 DIAGNOSIS — E669 Obesity, unspecified: Secondary | ICD-10-CM | POA: Insufficient documentation

## 2022-06-03 DIAGNOSIS — C671 Malignant neoplasm of dome of bladder: Secondary | ICD-10-CM | POA: Diagnosis not present

## 2022-06-03 DIAGNOSIS — I110 Hypertensive heart disease with heart failure: Secondary | ICD-10-CM

## 2022-06-03 DIAGNOSIS — E119 Type 2 diabetes mellitus without complications: Secondary | ICD-10-CM | POA: Diagnosis not present

## 2022-06-03 DIAGNOSIS — I2510 Atherosclerotic heart disease of native coronary artery without angina pectoris: Secondary | ICD-10-CM | POA: Insufficient documentation

## 2022-06-03 DIAGNOSIS — I5030 Unspecified diastolic (congestive) heart failure: Secondary | ICD-10-CM

## 2022-06-03 DIAGNOSIS — I509 Heart failure, unspecified: Secondary | ICD-10-CM | POA: Diagnosis not present

## 2022-06-03 DIAGNOSIS — Z6841 Body Mass Index (BMI) 40.0 and over, adult: Secondary | ICD-10-CM | POA: Insufficient documentation

## 2022-06-03 HISTORY — PX: TRANSURETHRAL RESECTION OF BLADDER TUMOR: SHX2575

## 2022-06-03 HISTORY — PX: CYSTOSCOPY W/ RETROGRADES: SHX1426

## 2022-06-03 SURGERY — TURBT (TRANSURETHRAL RESECTION OF BLADDER TUMOR)
Anesthesia: General | Site: Ureter

## 2022-06-03 MED ORDER — ACETAMINOPHEN 500 MG PO TABS
1000.0000 mg | ORAL_TABLET | Freq: Once | ORAL | Status: DC
  Filled 2022-06-03: qty 2

## 2022-06-03 MED ORDER — CHLORHEXIDINE GLUCONATE 0.12 % MT SOLN
15.0000 mL | Freq: Once | OROMUCOSAL | Status: AC
  Administered 2022-06-03: 15 mL via OROMUCOSAL

## 2022-06-03 MED ORDER — PROPOFOL 10 MG/ML IV BOLUS
INTRAVENOUS | Status: AC
  Filled 2022-06-03: qty 20

## 2022-06-03 MED ORDER — 0.9 % SODIUM CHLORIDE (POUR BTL) OPTIME
TOPICAL | Status: DC | PRN
  Administered 2022-06-03: 1000 mL

## 2022-06-03 MED ORDER — FENTANYL CITRATE PF 50 MCG/ML IJ SOSY
PREFILLED_SYRINGE | INTRAMUSCULAR | Status: AC
  Filled 2022-06-03: qty 1

## 2022-06-03 MED ORDER — ORAL CARE MOUTH RINSE: 15.0000 mL | Freq: Once | OROMUCOSAL | Status: AC

## 2022-06-03 MED ORDER — ONDANSETRON HCL 4 MG/2ML IJ SOLN
INTRAMUSCULAR | Status: DC | PRN
  Administered 2022-06-03: 4 mg via INTRAVENOUS

## 2022-06-03 MED ORDER — AMISULPRIDE (ANTIEMETIC) 5 MG/2ML IV SOLN: 10.0000 mg | Freq: Once | INTRAVENOUS | Status: DC | PRN

## 2022-06-03 MED ORDER — IOHEXOL 300 MG/ML  SOLN
INTRAMUSCULAR | Status: DC | PRN
  Administered 2022-06-03: 14 mL

## 2022-06-03 MED ORDER — DEXAMETHASONE SODIUM PHOSPHATE 10 MG/ML IJ SOLN
INTRAMUSCULAR | Status: AC
  Filled 2022-06-03: qty 1

## 2022-06-03 MED ORDER — LACTATED RINGERS IV SOLN: INTRAVENOUS | Status: DC

## 2022-06-03 MED ORDER — FENTANYL CITRATE PF 50 MCG/ML IJ SOSY
PREFILLED_SYRINGE | INTRAMUSCULAR | Status: AC
  Administered 2022-06-03: 50 ug via INTRAVENOUS
  Filled 2022-06-03: qty 2

## 2022-06-03 MED ORDER — LIDOCAINE 2% (20 MG/ML) 5 ML SYRINGE
INTRAMUSCULAR | Status: DC | PRN
  Administered 2022-06-03: 60 mg via INTRAVENOUS

## 2022-06-03 MED ORDER — FENTANYL CITRATE PF 50 MCG/ML IJ SOSY
25.0000 ug | PREFILLED_SYRINGE | INTRAMUSCULAR | Status: DC | PRN
  Administered 2022-06-03 (×2): 50 ug via INTRAVENOUS

## 2022-06-03 MED ORDER — SUGAMMADEX SODIUM 500 MG/5ML IV SOLN
INTRAVENOUS | Status: DC | PRN
  Administered 2022-06-03: 500 mg via INTRAVENOUS

## 2022-06-03 MED ORDER — PROPOFOL 10 MG/ML IV BOLUS
INTRAVENOUS | Status: DC | PRN
  Administered 2022-06-03: 150 mg via INTRAVENOUS
  Administered 2022-06-03: 80 mg via INTRAVENOUS
  Administered 2022-06-03: 50 mg via INTRAVENOUS

## 2022-06-03 MED ORDER — SODIUM CHLORIDE 0.9 % IR SOLN
Status: DC | PRN
  Administered 2022-06-03: 9000 mL

## 2022-06-03 MED ORDER — FENTANYL CITRATE (PF) 100 MCG/2ML IJ SOLN
INTRAMUSCULAR | Status: AC
  Filled 2022-06-03: qty 2

## 2022-06-03 MED ORDER — ROCURONIUM BROMIDE 100 MG/10ML IV SOLN
INTRAVENOUS | Status: DC | PRN
  Administered 2022-06-03: 30 mg via INTRAVENOUS

## 2022-06-03 MED ORDER — FENTANYL CITRATE (PF) 100 MCG/2ML IJ SOLN
INTRAMUSCULAR | Status: DC | PRN
  Administered 2022-06-03 (×4): 25 ug via INTRAVENOUS

## 2022-06-03 MED ORDER — DEXAMETHASONE SODIUM PHOSPHATE 10 MG/ML IJ SOLN
INTRAMUSCULAR | Status: DC | PRN
  Administered 2022-06-03: 4 mg via INTRAVENOUS

## 2022-06-03 MED ORDER — SUCCINYLCHOLINE CHLORIDE 200 MG/10ML IV SOSY
PREFILLED_SYRINGE | INTRAVENOUS | Status: DC | PRN
  Administered 2022-06-03: 140 mg via INTRAVENOUS

## 2022-06-03 MED ORDER — GENTAMICIN SULFATE 40 MG/ML IJ SOLN
5.0000 mg/kg | INTRAVENOUS | Status: AC
  Administered 2022-06-03: 450 mg via INTRAVENOUS
  Filled 2022-06-03: qty 11.25

## 2022-06-03 SURGICAL SUPPLY — 24 items
BAG COUNTER SPONGE SURGICOUNT (BAG) IMPLANT
BAG URINE DRAIN 2000ML AR STRL (UROLOGICAL SUPPLIES) IMPLANT
BAG URO CATCHER STRL LF (MISCELLANEOUS) ×2 IMPLANT
CATH HEMATURIA 20FR (CATHETERS) IMPLANT
CATH URETL OPEN END 6FR 70 (CATHETERS) IMPLANT
CLOTH BEACON ORANGE TIMEOUT ST (SAFETY) ×2 IMPLANT
DRAPE FOOT SWITCH (DRAPES) ×2 IMPLANT
ELECT REM PT RETURN 15FT ADLT (MISCELLANEOUS) ×2 IMPLANT
EVACUATOR MICROVAS BLADDER (UROLOGICAL SUPPLIES) IMPLANT
GLOVE SURG LX STRL 7.5 STRW (GLOVE) ×2 IMPLANT
GOWN SRG XL LVL 4 BRTHBL STRL (GOWNS) ×2 IMPLANT
GOWN STRL NON-REIN XL LVL4 (GOWNS) ×2
GOWN STRL REUS W/ TWL XL LVL3 (GOWN DISPOSABLE) ×2 IMPLANT
GOWN STRL REUS W/TWL XL LVL3 (GOWN DISPOSABLE) ×2
GUIDEWIRE STR DUAL SENSOR (WIRE) ×2 IMPLANT
KIT TURNOVER KIT A (KITS) IMPLANT
LOOP CUT BIPOLAR 24F LRG (ELECTROSURGICAL) IMPLANT
MANIFOLD NEPTUNE II (INSTRUMENTS) ×2 IMPLANT
NS IRRIG 1000ML POUR BTL (IV SOLUTION) IMPLANT
PACK CYSTO (CUSTOM PROCEDURE TRAY) ×2 IMPLANT
SYR TOOMEY IRRIG 70ML (MISCELLANEOUS)
SYRINGE TOOMEY IRRIG 70ML (MISCELLANEOUS) IMPLANT
TUBING CONNECTING 10 (TUBING) ×2 IMPLANT
TUBING UROLOGY SET (TUBING) ×2 IMPLANT

## 2022-06-03 NOTE — Brief Op Note (Signed)
06/03/2022  6:04 PM  PATIENT:  Luis [name]  67 y.o. male  PRE-OPERATIVE DIAGNOSIS:  RECURRENT BLADDER CANCER  POST-OPERATIVE DIAGNOSIS:  recurrent bladder cancer  PROCEDURE:  Procedure(s): TRANSURETHRAL RESECTION OF BLADDER TUMOR (TURBT) (N/A) CYSTOSCOPY WITH RETROGRADE PYELOGRAM (Bilateral)  SURGEON:  Surgeon(s) and Role:    * Alexis Frock, MD - Primary  PHYSICIAN ASSISTANT:   ASSISTANTS: none   ANESTHESIA:   general  EBL:  minimal   BLOOD ADMINISTERED:none  DRAINS:  97F foley to gravity. 10cc water in balloon. Irrigation port plugged.    LOCAL MEDICATIONS USED:  NONE  SPECIMEN:  Source of Specimen:  prostatic uretrha  DISPOSITION OF SPECIMEN:  PATHOLOGY  COUNTS:  YES  TOURNIQUET:  * No tourniquets in log *  DICTATION: .Other Dictation: Dictation Number [id number]  PLAN OF CARE: Discharge to home after PACU  PATIENT DISPOSITION:  PACU - hemodynamically stable.   Delay start of Pharmacological VTE agent (>24hrs) due to surgical blood loss or risk of bleeding: not applicable

## 2022-06-03 NOTE — Op Note (Unsigned)
NAME: Luis [name], Luis [name] MEDICAL RECORD NO: [id number] ACCOUNT NO: [id number] DATE OF BIRTH: [birth year] FACILITY: [facility] LOCATION: [facility] PHYSICIAN: Alexis Frock, MD  Operative Report   DATE OF PROCEDURE: 06/03/2022  PREOPERATIVE DIAGNOSIS:  Recurrent bladder cancer.  PROCEDURES PERFORMED: 1.  Transurethral resection of bladder tumor, volume medium. 2.  Bilateral retrograde pyelograms with interpretation.  ESTIMATED BLOOD LOSS:  Nil.  COMPLICATIONS:  None.  SPECIMEN:  Prostatic urethra for permanent pathology.  FINDINGS:  1.  Papillary tumor in the prostatic urethra through the verumontanum.  This was concerning for intraductal invasion. 2.  Bladder neck erythema. 3.  Erythema and papillary tumor in the area of the bladder dome.  His body habitus makes it impossible to resect. This area was fulgurated only. 4.  Extreme truncal obesity.  DRAINS: A 20-French 3-way coude type Foley catheter to straight drain, 10 mL water in the balloon.  Irrigation port plugged.  INDICATIONS:  Luis [name] is a pleasant, but unfortunate 67 year old man with history of recurrent high-grade urothelial carcinoma of the bladder.  He has extreme truncal obesity.  He is not a cystectomy candidate.  He was found on surveillance cystoscopy to  have area of questionable papillary tumor in the bladder dome as well as an area of erythema in his bladder neck that has been stable for a while. Given the area of new papillary tumor, it was felt transurethral resection was warranted.  Informed consent  was obtained and placed in medical record.  PROCEDURE DETAILS:  The patient being Luis [name] verified and the procedure being transurethral resection of bladder tumor, retrogrades confirmed.  Procedure timeout was performed.  Intravenous antibiotics were administered.  General LMA  anesthesia initially introduced.  The patient was placed into a low lithotomy position.  Sterile field was created, prepped and  draped the patient's penis, perineum, and proximal thighs using iodine.  Cystourethroscopy was performed using 21-French rigid  cystoscope with offset lens.  Inspection of anterior and posterior urethra revealed some questionable area of papillary tumor in the area of the prostatic urethra, especially the verumontanum.  This was not previously appreciated on office cystoscopy.   Inspection of the bladder was quite difficult given his extreme truncal obesity and angulation required.  It was essentially impossible to even visualize the bladder dome with LMA in place given his belly breathing.  I discussed with anesthesia staff the  very difficult technical nature of the procedure and that this may be more advantageous under general anesthesia with paralysis and anesthesia staffs graciously agreed and placed endotracheal tube with paralysis.  This improved the situation and as he  was no longer belly breathing, however, still remained technically nearly impossible. The area of bladder dome tumor was seen and could be barely reached with the  resectoscope loop to an extent that I felt that fulguration would be a safe, but frank  resection hazardous as there is significant bleeding that was encountered.  There was no way I could control this.  As such, a fulguration alone technique was used throughout the bladder dome, fulgurating the area of small papillary tumor that was  approximately 2-3 cm.  Attention was directed towards retrograde pyelogram and left ureteral orifice was cannulated with a 6-French Foley catheter and a left retrograde pyelogram was obtained.  Left retrograde pyelogram demonstrates a single left ureter, single system left kidney.  No filling defects or narrowing noted.  Similarly, a right retrograde pyelogram was obtained.  Right retrograde pyelogram demonstrated single right ureter, single system right  kidney.  No filling defects or narrowing noted.  Using resectoscope, once again, the  area of the bladder neck erythema was carefully resected down to superficial  fibromuscular stroma of the urinary bladder. Fragments set aside for permanent pathology.  The base of this was fulgurated and the area of the prostatic urethra was once again inspected.  Again, there appeared to be papillary tumor in the area of the  verumontanum.  This was somewhat concerning for possible intraductal invasion and this was resected with fragments set aside labeled as prostatic urethra.  Again, there did appear to be questionable gross tumor visible at the deeper aspect of this, the  base of this area was fulgurated.  Given the instrumentation in the area of the prostatic urethra, it was felt that the safest means of management would be interval short-term catheterization; as such, a new 20-French 3-way coude catheter was placed per  urethra straight drain, 10 mL water in the balloon.  Efflux was essentially clear. Irrigation port was plugged.  Procedure was terminated.  The patient tolerated the procedure well, no immediate perioperative complications.  The patient was taken to  postanesthesia care unit in stable condition.  Plan for discharge home with catheter.  We will request for followup for trial of void early next week.   MUK D: 06/03/2022 6:10:28 pm T: 06/03/2022 8:22:00 pm  JOB: [id number] [id number]

## 2022-06-03 NOTE — Anesthesia Procedure Notes (Signed)
Procedure Name: LMA Insertion Date/Time: 06/03/2022 5:26 PM  Performed by: Niel Hummer, CRNAPre-anesthesia Checklist: Patient identified, Emergency Drugs available, Suction available and Patient being monitored Patient Re-evaluated:Patient Re-evaluated prior to induction Oxygen Delivery Method: Circle system utilized Preoxygenation: Pre-oxygenation with 100% oxygen Induction Type: IV induction LMA: LMA with gastric port inserted LMA Size: 5.0 Number of attempts: 1 Dental Injury: Teeth and Oropharynx as per pre-operative assessment

## 2022-06-03 NOTE — H&P (Signed)
Luis [name] is an 67 y.o. male.    Chief Complaint: Pre-Op Transurethral Resection of Bladder Tumor  HPI:   1 - High Grade Bladder Cancer - T1G3 by TURBT + gemcitabine 09/2019 by Luis [name] for small left wall tumor, no muscle in specimen on eval gross hematuria. CT w/o upper tract lesions. Restaging 11/2019 with CIS only, no gross disease. Induction BCG x 6 ending 01/2020.   Recent Surveillance:  03/2020 cysto - old resection site left bladder neck noted.  06/2020 cysto - no recurrence; 09/2020 cysto - left bladder neck 1.5cm recurrence==> TURBT T1G3; 11/2020 restaging TURBT CIS only including prostatic urethra  12/2020 - re-induction BCG x6 (not good cystectomy candidate)  06/2021 cysto no recurrence; 10/2021 cysto ? left doem early recurrence (3cm erythema, no papillary tumor); 01/2022 stable left dome area mild erythema  04/2021 - cysto stable chronic left dome erythema and NEW left base lateral papillary tumor (abour 3cm)   2 - Urinary Retention - on/off uirnary retention 2021. Prostate vol 97m (fiarly normal for age) by CT ellipsoid calculation 2021. Placed on tamsulosin and passed trial of void 10/2019. FU PVR 11/2019 "017m   PMH sig for obesity (most truncal), CAD/Stents/CHF/AVR/Lasix (follows Luis. McAlgernon HuxleyASA only now which he has held prior), lap chole, neck surgery, elbow surgery. 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   Today " Luis Rondois seen to proceed with TURBT / retrogrades for management of recurrent bladder cancet. No interval fevers. Most recent UCX negative, Cr 1's, Hgb 14.9.   Past Medical History:  Diagnosis Date   Anxiety    takes Valium as needed   Aortic stenosis, moderate    Arthritis    Ascending aortic aneurysm (HCC)    CAD (coronary artery disease)    a. s/p PCI of the RCA 8/12 with DES by Luis CoBurt Knackpreserved EF. b. LHC/RHC (2/16) with mean RA 12, PA 32/15, mean PCWP 18, CI 3.47; patent mid and distal RCA stents, 50-60% proximal stenosis  small PDA.      [facility]   bladder   Carotid stenosis    a. Carotid [place]):  Bilateral 1-39% ICA; L thyroid nodule (prior hx of aspiration).   Chronic diastolic CHF (congestive heart failure) (HCC)    Complication of anesthesia    difficulty waking up after gallbladder surgery   Depression    Dyspnea    Essential hypertension    GERD (gastroesophageal reflux disease)    if needed will take OTC meds    Heart murmur    History of colonic polyps    hyperplastic   Hyperlipidemia    Joint pain    Lesion of bladder    [place]   Obesity (BMI 30-39.9) 02/29/2016   Pre-diabetes    Restless leg    Sleep apnea    uses cpap   Tubular adenoma of colon    Vertigo    takes Meclizine as needed    Past Surgical History:  Procedure Laterality Date   AORTIC VALVE REPLACEMENT N/A 09/16/2021   Procedure: AORTIC VALVE REPLACEMENT (AVR);  Surgeon: Luis PollackMD;  [place] [facility];  Laterality: N/A;   CATARACT EXTRACTION   4 YRS AGO   BOTH EYES   CHOLECYSTECTOMY  07/21/2011   Procedure: LAPAROSCOPIC CHOLECYSTECTOMY WITH INTRAOPERATIVE CHOLANGIOGRAM;  Surgeon: Luis MedalMD;  Location: [facility];  Service: General;  Laterality: N/A;   CORONARY ANGIOPLASTY  2012   2 stents   coronary stenting     s/p PCI of the RCA by Luis Luis [name] 8/12 with 2 promus stents   CYSTOSCOPY W/ RETROGRADES Bilateral 12/13/2019   Procedure: CYSTOSCOPY WITH RETROGRADE PYELOGRAM;  Surgeon: Luis Frock, MD;  Location: [facility];  Service: Urology;  Laterality: Bilateral;   CYSTOSCOPY W/ RETROGRADES Bilateral 10/14/2020   Procedure: CYSTOSCOPY WITH RETROGRADE PYELOGRAM;  Surgeon: Luis Frock, MD;  Location: [facility];  Service: Urology;  Laterality: Bilateral;   CYSTOSCOPY W/ RETROGRADES Bilateral 12/16/2020   Procedure: CYSTOSCOPY WITH RETROGRADE PYELOGRAM;  Surgeon: Luis Frock, MD;  Location: [facility];  Service: Urology;  Laterality: Bilateral;   LEFT AND RIGHT HEART CATHETERIZATION WITH CORONARY ANGIOGRAM N/A 06/23/2014   Procedure: LEFT AND RIGHT HEART CATHETERIZATION WITH CORONARY ANGIOGRAM;  Surgeon: Luis Dresser, MD;  Location: [facility] CATH LAB;  Service: Cardiovascular;  Laterality: N/A;   NECK SURGERY  03/23/09   per Luis. Lorin [name], cervical fusion    PERICARDIOCENTESIS N/A 09/28/2021   Procedure: PERICARDIOCENTESIS;  Surgeon: Luis Booze, MD;  Location: [place] CV LAB;  Service: Cardiovascular;  Laterality: N/A;   REPLACEMENT ASCENDING AORTA N/A 09/16/2021   Procedure: REPLACEMENT ASCENDING AORTA WITH 30 X 10MM HEMASHIELD PLATINUM WOVEN DOUBLE VELOUR VASCULAR GRAFT;  Surgeon: Luis Pollack, MD;  Location: [place];  Service: Open Heart Surgery;  Laterality: N/A;  CIRC ARREST   right elbow surgery     RIGHT HEART CATH AND CORONARY ANGIOGRAPHY N/A 07/08/2021   Procedure: RIGHT HEART CATH AND CORONARY ANGIOGRAPHY;  Surgeon: Luis Dresser, MD;  Location: [place] CV LAB;  Service: Cardiovascular;  Laterality: N/A;   solonscopy  05/23/08   per Luis. Wynona [name] hemorrhoids only, repeat in 5 years   SUBXYPHOID PERICARDIAL WINDOW N/A 09/28/2021   Procedure: SUBXYPHOID PERICARDIAL WINDOW;  Surgeon: Luis Pollack, MD;  Location: [place];  Service: Thoracic;  Laterality: N/A;   TEE WITHOUT CARDIOVERSION N/A 06/23/2014   Procedure: TRANSESOPHAGEAL ECHOCARDIOGRAM (TEE);  Surgeon: Luis Dresser, MD;  Location: [place];  Service: Cardiovascular;  Laterality: N/A;   TEE WITHOUT CARDIOVERSION N/A 01/21/2016   Procedure: TRANSESOPHAGEAL ECHOCARDIOGRAM (TEE);  Surgeon: Luis Dresser, MD;  Location: [place];  Service: Cardiovascular;  Laterality: N/A;   TEE WITHOUT CARDIOVERSION N/A 09/16/2021   Procedure: TRANSESOPHAGEAL ECHOCARDIOGRAM (TEE);  Surgeon: Luis Pollack, MD;  Location: [place];  Service: Open Heart Surgery;  Laterality: N/A;   TEE WITHOUT CARDIOVERSION N/A  09/28/2021   Procedure: TRANSESOPHAGEAL ECHOCARDIOGRAM (TEE);  Surgeon: Luis Pollack, MD;  Location: [facility] OR;  Service: Thoracic;  Laterality: N/A;   TONSILLECTOMY     TRANSURETHRAL RESECTION OF BLADDER TUMOR N/A 10/21/2019   Procedure: TRANSURETHRAL RESECTION OF BLADDER TUMOR (TURBT);  Surgeon: Kathie Rhodes, MD;  Location[facility];  Service: Urology;  Laterality: N/A;   TRANSURETHRAL RESECTION OF BLADDER TUMOR N/A 12/13/2019   Procedure: TRANSURETHRAL RESECTION OF BLADDER TUMOR (TURBT);  Surgeon: Luis Frock, MD;  Location[facility];  Service: Urology;  Laterality: N/A;  1 HR   TRANSURETHRAL RESECTION OF BLADDER TUMOR N/A 10/14/2020   Procedure: TRANSURETHRAL RESECTION OF BLADDER TUMOR (TURBT);  Surgeon: Luis Frock, MD;  Location: [facility];  Service: Urology;  Laterality: N/A;   TRANSURETHRAL RESECTION OF BLADDER TUMOR N/A 12/16/2020   Procedure: RESTAGING TRANSURETHRAL RESECTION OF BLADDER TUMOR (TURBT);  Surgeon: Luis Frock, MD;  Location: [facility];  Service: Urology;  Laterality: N/A;    Family History  Problem Relation Age of Onset   Lung cancer Mother        lung   Esophageal cancer Cousin    Colon cancer Neg Hx    Rectal cancer Neg Hx    Stomach cancer Neg Hx    Social History:  reports that he quit smoking about 12 years ago. His smoking use included cigarettes. He has a 80.00 pack-year smoking history. He has never used smokeless tobacco. He reports that he does not drink alcohol and does not use drugs.  Allergies:  Allergies  Allergen Reactions   Roxicodone [Oxycodone] Other (See Comments)    Insomnia, "makes pt crazy".    No medications prior to admission.    No results found for this or any previous visit (from the past 48 hour(s)). No results found.  Review of Systems  Constitutional:  Negative for chills and fever.  All other systems reviewed and are negative.   There were no  vitals taken for this visit. Physical Exam Vitals reviewed.  HENT:     Head: Normocephalic.  Eyes:     Pupils: Pupils are equal, round, and reactive to light.  Cardiovascular:     Rate and Rhythm: Normal rate.  Pulmonary:     Effort: Pulmonary effort is normal.  Abdominal:     Comments: Stable very large truncal obesity.   Genitourinary:    Comments: No CVAT Musculoskeletal:        General: Normal range of motion.     Cervical back: Normal range of motion.  Skin:    General: Skin is warm.  Neurological:     General: No focal deficit present.     Mental Status: He is alert.  Psychiatric:        Mood and Affect: Mood normal.      Assessment/Plan  Proceed as planned with TURBT, retrogrades. Risks, benefits, alternatives, expected peri-op course discussed previously and reiterated today.   Luis Frock, MD 06/03/2022, 6:56 AM

## 2022-06-03 NOTE — Discharge Instructions (Addendum)
1 - You may have urinary urgency (bladder spasms) and bloody urine on / off for up to 2 weeks. This is normal.  2 - Call MD or go to ER for fever >102, severe pain / nausea / vomiting not relieved by medications, or acute change in medical status

## 2022-06-03 NOTE — Anesthesia Preprocedure Evaluation (Addendum)
Anesthesia Evaluation  Patient identified by MRN, date of birth, ID band Patient awake    Reviewed: Allergy & Precautions, NPO status , Patient's Chart, lab work & pertinent test results, reviewed documented beta blocker date and time   History of Anesthesia Complications (+) history of anesthetic complications (difficulty waking up after gallbladder surgery)  Airway Mallampati: III  TM Distance: >3 FB Neck ROM: Full   Comment: Previous grade I view with glidescope 4, easy mask Dental  (+) Upper Dentures, Lower Dentures   Pulmonary neg shortness of breath, sleep apnea , neg COPD, neg recent URI, former smoker   Pulmonary exam normal breath sounds clear to auscultation       Cardiovascular hypertension, Pt. on home beta blockers and Pt. on medications + CAD, + Past MI (2012), + Cardiac Stents and +CHF (diastolic)  + dysrhythmias (1st degree AV block) + Valvular Problems/Murmurs (s/p AVR 08/27/2021) AS  Rhythm:Regular Rate:Normal  HLD, ascending aortic aneurysm, carotid stenosis  TTE 10/29/2021: IMPRESSIONS     1. Left ventricular ejection fraction, by estimation, is 60 to 65%. The  left ventricle has normal function. The left ventricle has no regional  wall motion abnormalities. Left ventricular diastolic parameters were  normal.   2. Right ventricular systolic function is normal. The right ventricular  size is normal.   3. Possible trivial pericardial effusion anterior to RV.   4. The mitral valve is normal in structure. Trivial mitral valve  regurgitation. No evidence of mitral stenosis.   5. The aortic valve is normal in structure. Aortic valve regurgitation is  not visualized. No aortic stenosis is present. There is a 25 mm Edwards  Inspiris Resilia valve present in the aortic position. Procedure Date:  09/16/21. Aortic valve mean gradient  measures 12.8 mmHg. Aortic valve Vmax measures 2.50 m/s.   6. Aortic root/ascending  aorta has been repaired/replaced. There is mild  dilatation of the aortic root, measuring 38 mm.   7. The inferior vena cava is normal in size with greater than 50%  respiratory variability, suggesting right atrial pressure of 3 mmHg.     Neuro/Psych neg Seizures PSYCHIATRIC DISORDERS Anxiety Depression    Vertigo     GI/Hepatic Neg liver ROS,GERD  ,,  Endo/Other  diabetes (Hgb A1c 5.9), Well Controlled, Type 2, Oral Hypoglycemic Agents    Renal/GU negative Renal ROS     Musculoskeletal  (+) Arthritis ,    Abdominal  (+) + obese  Peds  Hematology negative hematology ROS (+)   Anesthesia Other Findings 67 y.o. former smoker with h/o sleep apnea, CAD (PCI to RCA in 8/12), CHF, s/p AV replacement 08/2021, S/p replacement of ascending aorta 5/23, post operative a-fib, recurrent bladder cancer   Reproductive/Obstetrics                             Anesthesia Physical Anesthesia Plan  ASA: 3  Anesthesia Plan: General   Post-op Pain Management: Tylenol PO (pre-op)*   Induction: Intravenous  PONV Risk Score and Plan: 2 and Ondansetron, Dexamethasone and Treatment may vary due to age or medical condition  Airway Management Planned: LMA  Additional Equipment:   Intra-op Plan:   Post-operative Plan: Extubation in OR  Informed Consent: I have reviewed the patients History and Physical, chart, labs and discussed the procedure including the risks, benefits and alternatives for the proposed anesthesia with the patient or authorized representative who has indicated his/her understanding and acceptance.  Dental advisory given  Plan Discussed with: CRNA and Anesthesiologist  Anesthesia Plan Comments: (Risks of general anesthesia discussed including, but not limited to, sore throat, hoarse voice, chipped/damaged teeth, injury to vocal cords, nausea and vomiting, allergic reactions, lung infection, heart attack, stroke, and death. All questions  answered. )        Anesthesia Quick Evaluation

## 2022-06-03 NOTE — Transfer of Care (Signed)
Immediate Anesthesia Transfer of Care Note  Patient: Luis [name]  Procedure(s) Performed: TRANSURETHRAL RESECTION OF BLADDER TUMOR (TURBT) (Bladder) CYSTOSCOPY WITH RETROGRADE PYELOGRAM (Bilateral: Ureter)  Patient Location: PACU  Anesthesia Type:General  Level of Consciousness: awake, alert , and patient cooperative  Airway & Oxygen Therapy: Patient Spontanous Breathing and Patient connected to face mask oxygen  Post-op Assessment: Report given to RN, Post -op Vital signs reviewed and stable, and Patient moving all extremities X 4  Post vital signs: Reviewed and stable  Last Vitals:  Vitals Value Taken Time  BP 167/83   Temp    Pulse 67 06/03/22 1819  Resp 12 06/03/22 1819  SpO2 100 % 06/03/22 1819  Vitals shown include unvalidated device data.  Last Pain:  Vitals:   06/03/22 1341  TempSrc: Oral         Complications: No notable events documented.

## 2022-06-03 NOTE — Anesthesia Procedure Notes (Signed)
Procedure Name: Intubation Date/Time: 06/03/2022 5:45 PM  Performed by: Jonna Munro, CRNAPre-anesthesia Checklist: Patient identified, Emergency Drugs available, Suction available, Patient being monitored and Timeout performed Patient Re-evaluated:Patient Re-evaluated prior to induction Oxygen Delivery Method: Circle system utilized Preoxygenation: Pre-oxygenation with 100% oxygen Induction Type: IV induction Laryngoscope Size: Mac and 4 Grade View: Grade I Tube type: Oral Tube size: 7.5 mm Number of attempts: 1 Airway Equipment and Method: Stylet Placement Confirmation: ETT inserted through vocal cords under direct vision, positive ETCO2, CO2 detector and breath sounds checked- equal and bilateral Secured at: 23 cm Tube secured with: Tape Dental Injury: Teeth and Oropharynx as per pre-operative assessment

## 2022-06-05 ENCOUNTER — Encounter (HOSPITAL_COMMUNITY): Payer: Self-pay | Admitting: Urology

## 2022-06-07 DIAGNOSIS — C678 Malignant neoplasm of overlapping sites of bladder: Secondary | ICD-10-CM | POA: Diagnosis not present

## 2022-06-07 NOTE — Anesthesia Postprocedure Evaluation (Signed)
Anesthesia Post Note  Patient: Luis [name]  Procedure(s) Performed: TRANSURETHRAL RESECTION OF BLADDER TUMOR (TURBT) (Bladder) CYSTOSCOPY WITH RETROGRADE PYELOGRAM (Bilateral: Ureter)     Patient location during evaluation: PACU Anesthesia Type: General Level of consciousness: awake and alert Pain management: pain level controlled Vital Signs Assessment: post-procedure vital signs reviewed and stable Respiratory status: spontaneous breathing, nonlabored ventilation, respiratory function stable and patient connected to nasal cannula oxygen Cardiovascular status: blood pressure returned to baseline and stable Postop Assessment: no apparent nausea or vomiting Anesthetic complications: no   No notable events documented.  Last Vitals:  Vitals:   06/03/22 1900 06/03/22 1915  BP: (!) 145/79 (!) 160/83  Pulse: 62 67  Resp: 12 13  Temp:  36.7 C  SpO2: 93% 98%    Last Pain:  Vitals:   06/03/22 1915  TempSrc:   PainSc: 0-No pain                 Eartha Vonbehren S

## 2022-06-08 LAB — SURGICAL PATHOLOGY

## 2022-06-09 ENCOUNTER — Other Ambulatory Visit (HOSPITAL_COMMUNITY): Payer: Self-pay | Admitting: Family Medicine

## 2022-06-09 NOTE — Telephone Encounter (Signed)
LATE ENTRY: Received a medical clearance form from [facility] , requesting Cardiology clearance for the following procedure: transitional resection of bladder tumor. Medical clearance form was signed by Theador Hawthorne and successfully faxed to [phone] on Friday, February 9,. Form will be scanned into patients chart. \

## 2022-06-14 DIAGNOSIS — C678 Malignant neoplasm of overlapping sites of bladder: Secondary | ICD-10-CM | POA: Diagnosis not present

## 2022-06-22 ENCOUNTER — Other Ambulatory Visit (HOSPITAL_COMMUNITY): Payer: Self-pay | Admitting: Cardiology

## 2022-06-22 ENCOUNTER — Other Ambulatory Visit: Payer: Self-pay | Admitting: Family Medicine

## 2022-06-22 DIAGNOSIS — F411 Generalized anxiety disorder: Secondary | ICD-10-CM

## 2022-07-05 DIAGNOSIS — R338 Other retention of urine: Secondary | ICD-10-CM | POA: Diagnosis not present

## 2022-07-05 DIAGNOSIS — C678 Malignant neoplasm of overlapping sites of bladder: Secondary | ICD-10-CM | POA: Diagnosis not present

## 2022-07-07 ENCOUNTER — Telehealth: Payer: Self-pay | Admitting: Hematology and Oncology

## 2022-07-07 NOTE — Telephone Encounter (Signed)
scheduled per 3/14 referral , pt has been called and confirmed date and time. Pt is aware of location and to arrive early for check in

## 2022-07-14 ENCOUNTER — Telehealth: Payer: Self-pay

## 2022-07-14 NOTE — Progress Notes (Signed)
Care Management & Coordination Services Pharmacy Team  Reason for Encounter: Hypertension  Contacted patient to discuss hypertension disease state. Unsuccessful outreach. Left voicemail for patient to return call. Multiple attempts  Current antihypertensive regimen:  Carvedilol 3.125 mg twice daily  Patient verbally confirms he is taking the above medications as directed.   How often are you checking your Blood Pressure?   he checks his blood pressure   taking his medication.  Current home BP readings:  DATE:             BP               PULSE   Wrist or arm cuff:  OTC medications including pseudoephedrine or NSAIDs?  Any readings above 180/100?  If yes any symptoms of hypertensive emergency?   What recent interventions/DTPs have been made by any provider to improve Blood Pressure control since last CPP Visit:   Any recent hospitalizations or ED visits since last visit with CPP?   What diet changes have been made to improve Blood Pressure Control?  Patient follows Breakfast -  Lunch -  Dinner -  Caffeine intake: Salt intake:  What exercise is being done to improve your Blood Pressure Control?    Adherence Review: Is the patient currently on ACE/ARB medication? No Does the patient have >5 day gap between last estimated fill dates? No  Care Gaps: AWV - completed 08/18/2021, scheduled 08/22/2022 Next appointment -  Urine ACR - never done Hep C screen - never done Tdap - never done Shingrix - never done Flu - overdue Covid - overdue Pneumovax - postponed   Star Rating Drugs: Rosuvastatin 20mg  - last filled 06/11/2022 90 DS at [facility]  Chart Updates: Recent office visits:  05/23/2022 [name] MD - Patient was seen for Generalized anxiety disorder. Started Diazepam 5 mg q 12 hours prn. Discontinued Citalopram.   Recent consult visits:  03/25/2023 Armond Hang MD (ortho) - Patient was seen for pain in left foot and additional concerns. No additional chart  notes.   Hospital visits:  [facility] on 06/03/2022 (7 hours) due to transurethral resection of bladder tumor.    New?Medications Started at [facility] Discharge:?? None Medication Changes at Hospital Discharge: None Medications Discontinued at Hospital Discharge: None Medications that remain the same after Hospital Discharge:??  -All other medications will remain the same.    Medications: Outpatient Encounter Medications as of 07/14/2022  Medication Sig   acetaminophen (TYLENOL) 500 MG tablet Take 1-2 tablets (500-1,000 mg total) by mouth every 6 (six) hours as needed for moderate pain. (Patient taking differently: Take 1,000 mg by mouth every 6 (six) hours as needed for moderate pain.)   aspirin 81 MG tablet Take 81 mg by mouth every morning.   carvedilol (COREG) 3.125 MG tablet TAKE 1 TABLET BY MOUTH TWICE A DAY WITH A MEAL (Patient taking differently: Take 3.125 mg by mouth 2 (two) times daily with a meal.)   diazepam (VALIUM) 5 MG tablet Take 1 tablet (5 mg total) by mouth every 12 (twelve) hours as needed for anxiety.   empagliflozin (JARDIANCE) 10 MG TABS tablet Take 1 tablet (10 mg total) by mouth daily before breakfast.   Ferrous Sulfate (IRON PO) Take 1 tablet by mouth daily. 1 tab daily   furosemide (LASIX) 20 MG tablet TAKE 2 TABLETS (40 MG TOTAL) BY MOUTH EVERY MORNING AND 1 TABLET (20 MG TOTAL) EVERY EVENING.   ipratropium (ATROVENT) 0.03 % nasal spray Place 2 sprays into both  nostrils every 12 (twelve) hours as needed for rhinitis.   KLOR-CON M20 20 MEQ tablet TAKE 2 TABLETS BY MOUTH DAILY   Magnesium 500 MG TABS Take 500 mg by mouth daily.   meclizine (ANTIVERT) 25 MG tablet Take 1 tablet (25 mg total) by mouth 3 (three) times daily as needed for dizziness.   multivitamin-iron-minerals-folic acid (CENTRUM) chewable tablet Chew 1 tablet by mouth daily.   rosuvastatin (CRESTOR) 20 MG tablet TAKE 1 TABLET BY MOUTH EVERYDAY AT BEDTIME (Patient taking differently: Take  20 mg by mouth at bedtime.)   tamsulosin (FLOMAX) 0.4 MG CAPS capsule Take 0.4 mg by mouth daily.   traMADol (ULTRAM) 50 MG tablet Take 1 tablet (50 mg total) by mouth every 6 (six) hours as needed for moderate pain.   venlafaxine XR (EFFEXOR-XR) 150 MG 24 hr capsule Take 1 capsule (150 mg total) by mouth daily with breakfast.   No facility-administered encounter medications on file as of 07/14/2022.  Fill History:   Dispensed Days Supply Quantity Provider Pharmacy  CARVEDILOL 3.125 MG TABLET 06/22/2022 90 180 each      Dispensed Days Supply Quantity Provider Pharmacy  DIAZEPAM 5 MG TABLET 05/23/2022 30 60 each      Dispensed Days Supply Quantity Provider Pharmacy  JARDIANCE 10 MG TABLET 07/10/2022 30 30 tablet      Dispensed Days Supply Quantity Provider Pharmacy  FUROSEMIDE 20 MG TABLET 06/08/2022 90 225 each      Dispensed Days Supply Quantity Provider Pharmacy  KLOR-CON M20 TABLET 06/22/2022 90 180 each      Dispensed Days Supply Quantity Provider Pharmacy  ROSUVASTATIN CALCIUM 20 MG TAB 06/11/2022 90 90 each      Dispensed Days Supply Quantity Provider Pharmacy  TAMSULOSIN HCL 0.4 MG CAPSULE 06/01/2022 90 90 each      Dispensed Days Supply Quantity Provider Pharmacy  TRAMADOL HCL 50 MG TABLET 10/03/2021 3 14 each      Dispensed Days Supply Quantity Provider Pharmacy  VENLAFAXINE HCL ER 150 MG CAP 05/18/2022 90 90 each     Recent Office Vitals: BP Readings from Last 3 Encounters:  06/03/22 (!) 160/83  05/30/22 (!) 151/89  05/23/22 128/76   Pulse Readings from Last 3 Encounters:  06/03/22 67  05/30/22 61  05/23/22 61    Wt Readings from Last 3 Encounters:  06/03/22 282 lb (127.9 kg)  05/30/22 282 lb (127.9 kg)  05/23/22 285 lb (129.3 kg)     Kidney Function Lab Results  Component Value Date/Time   CREATININE 1.04 05/30/2022 12:52 PM   CREATININE 1.28 (H) 02/15/2022 12:13 PM   CREATININE 1.08 01/14/2016 02:31 PM   CREATININE 1.05 11/27/2015 03:07 PM   GFR  85.71 11/26/2014 09:11 AM   GFRNONAA >60 05/30/2022 12:52 PM   GFRAA >60 08/10/2019 12:24 PM       Latest Ref Rng & Units 05/30/2022   12:52 PM 02/15/2022   12:13 PM 02/01/2022   11:02 AM  BMP  Glucose 70 - 99 mg/dL 107  133  118   BUN 8 - 23 mg/dL 21  16  19    Creatinine 0.61 - 1.24 mg/dL 1.04  1.28  1.34   Sodium 135 - 145 mmol/L 142  140  138   Potassium 3.5 - 5.1 mmol/L 4.5  4.5  4.0   Chloride 98 - 111 mmol/L 104  103  101   CO2 22 - 32 mmol/L 27  29  29    Calcium 8.9 - 10.3 mg/dL 9.4  9.1  8.8    [place] Pharmacist Assistant [phone]

## 2022-07-29 ENCOUNTER — Inpatient Hospital Stay: Payer: PPO

## 2022-07-29 ENCOUNTER — Other Ambulatory Visit: Payer: Self-pay

## 2022-07-29 ENCOUNTER — Inpatient Hospital Stay: Payer: PPO | Attending: Hematology and Oncology | Admitting: Hematology and Oncology

## 2022-07-29 VITALS — BP 160/77 | HR 67 | Temp 97.7°F | Resp 17 | Wt 283.7 lb

## 2022-07-29 DIAGNOSIS — Z801 Family history of malignant neoplasm of trachea, bronchus and lung: Secondary | ICD-10-CM | POA: Diagnosis not present

## 2022-07-29 DIAGNOSIS — Z8041 Family history of malignant neoplasm of ovary: Secondary | ICD-10-CM | POA: Insufficient documentation

## 2022-07-29 DIAGNOSIS — C679 Malignant neoplasm of bladder, unspecified: Secondary | ICD-10-CM | POA: Insufficient documentation

## 2022-07-29 DIAGNOSIS — R310 Gross hematuria: Secondary | ICD-10-CM | POA: Diagnosis not present

## 2022-07-29 DIAGNOSIS — R350 Frequency of micturition: Secondary | ICD-10-CM | POA: Insufficient documentation

## 2022-07-29 DIAGNOSIS — Z79899 Other long term (current) drug therapy: Secondary | ICD-10-CM | POA: Insufficient documentation

## 2022-07-29 DIAGNOSIS — Z800 Family history of malignant neoplasm of digestive organs: Secondary | ICD-10-CM | POA: Diagnosis not present

## 2022-07-29 DIAGNOSIS — Z87891 Personal history of nicotine dependence: Secondary | ICD-10-CM | POA: Insufficient documentation

## 2022-07-29 LAB — CBC WITH DIFFERENTIAL (CANCER CENTER ONLY)
Abs Immature Granulocytes: 0.02 10*3/uL (ref 0.00–0.07)
Basophils Absolute: 0 10*3/uL (ref 0.0–0.1)
Basophils Relative: 1 %
Eosinophils Absolute: 0.3 10*3/uL (ref 0.0–0.5)
Eosinophils Relative: 4 %
HCT: 43.7 % (ref 39.0–52.0)
Hemoglobin: 14.7 g/dL (ref 13.0–17.0)
Immature Granulocytes: 0 %
Lymphocytes Relative: 23 %
Lymphs Abs: 1.5 10*3/uL (ref 0.7–4.0)
MCH: 30 pg (ref 26.0–34.0)
MCHC: 33.6 g/dL (ref 30.0–36.0)
MCV: 89.2 fL (ref 80.0–100.0)
Monocytes Absolute: 0.6 10*3/uL (ref 0.1–1.0)
Monocytes Relative: 8 %
Neutro Abs: 4.4 10*3/uL (ref 1.7–7.7)
Neutrophils Relative %: 64 %
Platelet Count: 156 10*3/uL (ref 150–400)
RBC: 4.9 MIL/uL (ref 4.22–5.81)
RDW: 14.5 % (ref 11.5–15.5)
WBC Count: 6.8 10*3/uL (ref 4.0–10.5)
nRBC: 0 % (ref 0.0–0.2)

## 2022-07-29 LAB — CMP (CANCER CENTER ONLY)
ALT: 27 U/L (ref 0–44)
AST: 22 U/L (ref 15–41)
Albumin: 4.2 g/dL (ref 3.5–5.0)
Alkaline Phosphatase: 89 U/L (ref 38–126)
Anion gap: 8 (ref 5–15)
BUN: 19 mg/dL (ref 8–23)
CO2: 27 mmol/L (ref 22–32)
Calcium: 9.5 mg/dL (ref 8.9–10.3)
Chloride: 104 mmol/L (ref 98–111)
Creatinine: 1.03 mg/dL (ref 0.61–1.24)
GFR, Estimated: >60 mL/min (ref >60)
Glucose, Bld: 156 mg/dL — ABNORMAL HIGH (ref 70–99)
Potassium: 3.8 mmol/L (ref 3.5–5.1)
Sodium: 139 mmol/L (ref 135–145)
Total Bilirubin: 0.9 mg/dL (ref 0.3–1.2)
Total Protein: 7.4 g/dL (ref 6.5–8.1)

## 2022-07-29 LAB — TSH: TSH: 1.357 u[IU]/mL (ref 0.350–4.500)

## 2022-07-29 NOTE — Progress Notes (Signed)
[facility] [phone]   [phone]  INITIAL CONSULT NOTE  Patient Care Team: Nelwyn Salisbury, MD as PCP - General Quintella Reichert, MD as PCP - Sleep Medicine (Cardiology) Laurey Morale, MD as PCP - Advanced Heart Failure (Cardiology) Hillis Range, MD (Inactive) as Consulting Physician (Cardiology) Verner Chol, [facility] (Inactive) as Pharmacist (Pharmacist)  Hematological/Oncological History # BCG-Unresponsive High Risk Non-Muscle Invasive Bladder Cancer 06/2020: left bladder neck recurrence, TURBT T1G3 11/2020: restaging TURBT showed CIS 12/2020: redinduction BCG x6 (deemed not a good surgical candidate)  06/2021: left dome early recurrence 3 cm erythema, no papillary tumor 04/2021: chronic left dome erythema, new left base lateral papillary tumor (3 cm) 06/18/2022: T1G3 prostatic urethra and multifocal bladder 07/29/2022: establish care with Dr. [name]   CHIEF COMPLAINTS/PURPOSE OF CONSULTATION:  "High Risk Non-Muscle Invasive Bladder Cancer "  HISTORY OF PRESENTING ILLNESS:  Luis [name] 67 y.o. 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  On review of the previous records he has an extensive history of bladder cancer dating back to at least August 2021.  The full history as noted above.  The patient was deemed not to be a surgical candidate for cystectomy and therefore was referred for consideration of palliative pembrolizumab and surveillance cystoscopies.  On exam today on exam today Luis [name] is accompanied by his wife.  He reports that he has been "peeing blood".  He notes that he has been through a lot including 2 rounds of BCG and 3 biopsies.  He notes that he does have frequent burning with urination.  He notes that he does have a lot of urinary frequency and goes to the bathroom at least once per hour.  He used the restroom twice during our  clinic visit.  He reports he has an occasional upset stomach but it does not occur every night.  He is a former smoker having quit 10 to 12 years ago.  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  He does not drink any alcohol.  On further discussion he reports his mother had lung cancer in his father was unknown to him because he was raised by his stepfather.  He has half brother had prostate cancer and he has 4 children.  He reports that he does have obstructive sleep apnea and is currently on a CPAP machine.  He otherwise denies any fevers, chills, sweats, nausea, vomiting or diarrhea.  A full 10 point ROS is otherwise negative.  MEDICAL HISTORY:  Past Medical History:  Diagnosis Date   Anxiety    takes Valium as needed   Aortic stenosis, moderate    Arthritis    Ascending aortic aneurysm (HCC)    CAD (coronary artery disease)    a. s/p PCI of the RCA 8/12 with DES by Dr Excell Seltzer, preserved EF. b. LHC/RHC (2/16) with mean RA 12, PA 32/15, mean PCWP 18, CI 3.47; patent mid and distal RCA stents, 50-60% proximal stenosis small PDA.      Cancer [facility])    bladder   Carotid stenosis    a. Carotid US (05/2013):  Bilateral 1-39% ICA; L thyroid nodule (prior hx of aspiration).   Chronic diastolic CHF (congestive heart failure) (HCC)    Complication of anesthesia    difficulty waking up after gallbladder surgery   Depression    Dyspnea    Essential hypertension    GERD (gastroesophageal  reflux disease)    if needed will take OTC meds    Heart murmur    History of colonic polyps    hyperplastic   Hyperlipidemia    Joint pain    Lesion of bladder    Myocardial infarction (HCC) 2012   Obesity (BMI 30-39.9) 02/29/2016   Pre-diabetes    Restless leg    Sleep apnea    uses cpap   Tubular adenoma of colon    Vertigo    takes Meclizine as needed    SURGICAL HISTORY: Past Surgical History:  Procedure Laterality Date   AORTIC VALVE REPLACEMENT N/A 09/16/2021   Procedure:  AORTIC VALVE REPLACEMENT (AVR);  Surgeon: Alleen BorneBartle, Bryan K, MD;  [facility];  Service: Open Heart Surgery;  Laterality: N/A;   CATARACT EXTRACTION   4 YRS AGO   BOTH EYES   CHOLECYSTECTOMY  07/21/2011   Procedure: LAPAROSCOPIC CHOLECYSTECTOMY WITH INTRAOPERATIVE CHOLANGIOGRAM;  Surgeon: Kandis Cockingavid H Newman, MD;  Location: [facility] ORS;  Service: General;  Laterality: N/A;   CORONARY ANGIOPLASTY  2012   2 stents   coronary stenting     s/p PCI of the RCA by Dr Excell Seltzerooper 8/12 with 2 promus stents   CYSTOSCOPY W/ RETROGRADES Bilateral 12/13/2019   Procedure: CYSTOSCOPY WITH RETROGRADE PYELOGRAM;  Surgeon: Sebastian AcheManny, Theodore, MD;  [facility] [place];  Service: Urology;  Laterality: Bilateral;   CYSTOSCOPY W/ RETROGRADES Bilateral 10/14/2020   Procedure: CYSTOSCOPY WITH RETROGRADE PYELOGRAM;  Surgeon: Sebastian AcheManny, Theodore, MD;  [facility] [place];  Service: Urology;  Laterality: Bilateral;   CYSTOSCOPY W/ RETROGRADES Bilateral 12/16/2020   Procedure: CYSTOSCOPY WITH RETROGRADE PYELOGRAM;  Surgeon: Sebastian AcheManny, Theodore, MD;  [facility] [place];  Service: Urology;  Laterality: Bilateral;   CYSTOSCOPY W/ RETROGRADES Bilateral 06/03/2022   Procedure: CYSTOSCOPY WITH RETROGRADE PYELOGRAM;  Surgeon: Sebastian AcheManny, Theodore, MD;  Location: [facility];  Service: Urology;  Laterality: Bilateral;   LEFT AND RIGHT HEART CATHETERIZATION WITH CORONARY ANGIOGRAM N/A 06/23/2014   Procedure: LEFT AND RIGHT HEART CATHETERIZATION WITH CORONARY ANGIOGRAM;  Surgeon: Laurey Moralealton S McLean, MD;  [facility];  Service: Cardiovascular;  Laterality: N/A;   NECK SURGERY  03/23/09   per Dr. Ophelia CharterYates, cervical fusion    PERICARDIOCENTESIS N/A 09/28/2021   Procedure: PERICARDIOCENTESIS;  Surgeon: Corky CraftsVaranasi, Jayadeep S, MD;  [facility];  Service: Cardiovascular;  Laterality: N/A;   REPLACEMENT ASCENDING AORTA N/A 09/16/2021   Procedure: REPLACEMENT ASCENDING AORTA WITH 30 X 10MM HEMASHIELD PLATINUM  WOVEN DOUBLE VELOUR VASCULAR GRAFT;  Surgeon: Alleen BorneBartle, Bryan K, MD;  Location: [facility] OR;  Service: Open Heart Surgery;  Laterality: N/A;  CIRC ARREST   right elbow surgery     RIGHT HEART CATH AND CORONARY ANGIOGRAPHY N/A 07/08/2021   Procedure: RIGHT HEART CATH AND CORONARY ANGIOGRAPHY;  Surgeon: Laurey MoraleMcLean, Dalton S, MD;  [facility];  Service: Cardiovascular;  Laterality: N/A;   solonscopy  05/23/08   per Dr. Celene KrasPatterson,external hemorrhoids only, repeat in 5 years   SUBXYPHOID PERICARDIAL WINDOW N/A 09/28/2021   Procedure: SUBXYPHOID PERICARDIAL WINDOW;  Surgeon: Alleen BorneBartle, Bryan K, MD;  Location: [facility] OR;  Service: Thoracic;  Laterality: N/A;   TEE WITHOUT CARDIOVERSION N/A 06/23/2014   Procedure: TRANSESOPHAGEAL ECHOCARDIOGRAM (TEE);  Surgeon: Laurey Moralealton S McLean, MD;  [facility];  Service: Cardiovascular;  Laterality: N/A;   TEE WITHOUT CARDIOVERSION N/A 01/21/2016   Procedure: TRANSESOPHAGEAL ECHOCARDIOGRAM (TEE);  Surgeon: Laurey Moralealton S McLean, MD;  [facility];  Service: Cardiovascular;  Laterality: N/A;   TEE WITHOUT CARDIOVERSION N/A 09/16/2021   Procedure: TRANSESOPHAGEAL ECHOCARDIOGRAM (TEE);  Surgeon: Alleen Borne, MD;  Location: [facility] OR;  Service: Open Heart Surgery;  Laterality: N/A;   TEE WITHOUT CARDIOVERSION N/A 09/28/2021   Procedure: TRANSESOPHAGEAL ECHOCARDIOGRAM (TEE);  Surgeon: Alleen Borne, MD;  Location: [facility] OR;  Service: Thoracic;  Laterality: N/A;   TONSILLECTOMY     TRANSURETHRAL RESECTION OF BLADDER TUMOR N/A 10/21/2019   Procedure: TRANSURETHRAL RESECTION OF BLADDER TUMOR (TURBT);  Surgeon: Ihor Gully, MD;  [facility];  Service: Urology;  Laterality: N/A;   TRANSURETHRAL RESECTION OF BLADDER TUMOR N/A 12/13/2019   Procedure: TRANSURETHRAL RESECTION OF BLADDER TUMOR (TURBT);  Surgeon: Sebastian Ache, MD;  [facility];  Service: Urology;  Laterality: N/A;  1 HR   TRANSURETHRAL RESECTION OF BLADDER TUMOR  N/A 10/14/2020   Procedure: TRANSURETHRAL RESECTION OF BLADDER TUMOR (TURBT);  Surgeon: Sebastian Ache, MD;  Location: [facility];  Service: Urology;  Laterality: N/A;   TRANSURETHRAL RESECTION OF BLADDER TUMOR N/A 12/16/2020   Procedure: RESTAGING TRANSURETHRAL RESECTION OF BLADDER TUMOR (TURBT);  Surgeon: Sebastian Ache, MD;  Location: [facility];  Service: Urology;  Laterality: N/A;   TRANSURETHRAL RESECTION OF BLADDER TUMOR N/A 06/03/2022   Procedure: TRANSURETHRAL RESECTION OF BLADDER TUMOR (TURBT);  Surgeon: Sebastian Ache, MD;  [facility];  Service: Urology;  Laterality: N/A;    SOCIAL HISTORY: Social History   Socioeconomic History   Marital status: Married    Spouse name: Not on file   Number of children: 4   Years of education: Not on file   Highest education level: Not on file  Occupational History   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  Tobacco Use   Smoking status: Former    Packs/day: 2.00    Years: 40.00    Additional pack years: 0.00    Total pack years: 80.00    Types: Cigarettes    Quit date: 2012    Years since quitting: 12.2   Smokeless tobacco: Never  Vaping Use   Vaping Use: Never used  Substance and Sexual Activity   Alcohol use: No    Alcohol/week: 0.0 standard drinks of alcohol   Drug use: No   Sexual activity: Not on file  Other Topics Concern   Not on file  Social History Narrative   Not on file   Social Determinants of Health   Financial Resource Strain: Low Risk  (08/18/2021)   Overall Financial Resource Strain (CARDIA)    Difficulty of Paying Living Expenses: Not hard at all  Food Insecurity: No Food Insecurity (08/18/2021)   Hunger Vital Sign    Worried About Running Out of Food in the Last Year: Never true    Ran Out of Food in the Last Year: Never true  Transportation Needs: No Transportation Needs (08/18/2021)   PRAPARE - Administrator, Civil Service  (Medical): No    Lack of Transportation (Non-Medical): No  Physical Activity: Insufficiently Active (08/18/2021)   Exercise Vital Sign    Days of Exercise per Week: 3 days    Minutes of Exercise per Session: 30 min  Stress: No Stress Concern Present (08/18/2021)   [employer] of Occupational Health - Occupational Stress Questionnaire    Feeling of Stress : Not at all  Social Connections: Moderately Isolated (08/18/2021)   Social Connection and Isolation Panel [NHANES]    Frequency  of Communication with Friends and Family: More than three times a week    Frequency of Social Gatherings with Friends and Family: More than three times a week    Attends Religious Services: Never    Database administratorActive Member of Clubs or Organizations: No    Attends BankerClub or Organization Meetings: Never    Marital Status: Married  Catering managerntimate Partner Violence: Not At Risk (08/18/2021)   Humiliation, Afraid, Rape, and Kick questionnaire    Fear of Current or Ex-Partner: No    Emotionally Abused: No    Physically Abused: No    Sexually Abused: No    FAMILY HISTORY: Family History  Problem Relation Age of Onset   Lung cancer Mother        lung   Esophageal cancer Cousin    Colon cancer Neg Hx    Rectal cancer Neg Hx    Stomach cancer Neg Hx     ALLERGIES:  has no active allergies.  MEDICATIONS:  Current Outpatient Medications  Medication Sig Dispense Refill   acetaminophen (TYLENOL) 500 MG tablet Take 1-2 tablets (500-1,000 mg total) by mouth every 6 (six) hours as needed for moderate pain. (Patient taking differently: Take 1,000 mg by mouth every 6 (six) hours as needed for moderate pain.) 30 tablet 0   aspirin 81 MG tablet Take 81 mg by mouth every morning.     carvedilol (COREG) 3.125 MG tablet TAKE 1 TABLET BY MOUTH TWICE A DAY WITH A MEAL (Patient taking differently: Take 3.125 mg by mouth 2 (two) times daily with a meal.) 180 tablet 1   diazepam (VALIUM) 5 MG tablet Take 1 tablet (5 mg total) by mouth every  12 (twelve) hours as needed for anxiety. 60 tablet 2   empagliflozin (JARDIANCE) 10 MG TABS tablet Take 1 tablet (10 mg total) by mouth daily before breakfast. 30 tablet 11   Ferrous Sulfate (IRON PO) Take 1 tablet by mouth daily. 1 tab daily     furosemide (LASIX) 20 MG tablet TAKE 2 TABLETS (40 MG TOTAL) BY MOUTH EVERY MORNING AND 1 TABLET (20 MG TOTAL) EVERY EVENING. 270 tablet 1   ibuprofen (ADVIL) 200 MG tablet Take by mouth.     ipratropium (ATROVENT) 0.03 % nasal spray Place 2 sprays into both nostrils every 12 (twelve) hours as needed for rhinitis.     KLOR-CON M20 20 MEQ tablet TAKE 2 TABLETS BY MOUTH DAILY 180 tablet 1   Magnesium 500 MG TABS Take 500 mg by mouth daily.     meclizine (ANTIVERT) 25 MG tablet Take 1 tablet (25 mg total) by mouth 3 (three) times daily as needed for dizziness. 30 tablet 0   multivitamin-iron-minerals-folic acid (CENTRUM) chewable tablet Chew 1 tablet by mouth daily.     phenazopyridine (PYRIDIUM) 200 MG tablet Take 200 mg by mouth every 8 (eight) hours as needed.     rosuvastatin (CRESTOR) 20 MG tablet TAKE 1 TABLET BY MOUTH EVERYDAY AT BEDTIME (Patient taking differently: Take 20 mg by mouth at bedtime.) 90 tablet 3   tamsulosin (FLOMAX) 0.4 MG CAPS capsule Take 0.4 mg by mouth daily.     venlafaxine XR (EFFEXOR-XR) 150 MG 24 hr capsule Take 1 capsule (150 mg total) by mouth daily with breakfast. 90 capsule 0   digoxin (LANOXIN) 0.25 MG tablet Take 1 tablet by mouth daily.     nitroGLYCERIN (NITROSTAT) 0.4 MG SL tablet Place under the tongue. (Patient not taking: Reported on 07/29/2022)     traMADol Janean Sark(ULTRAM)  50 MG tablet Take 1 tablet (50 mg total) by mouth every 6 (six) hours as needed for moderate pain. (Patient not taking: Reported on 07/29/2022) 14 tablet 0   No current facility-administered medications for this visit.    REVIEW OF SYSTEMS:   Constitutional: ( - ) fevers, ( - )  chills , ( - ) night sweats Eyes: ( - ) blurriness of vision, ( - ) double  vision, ( - ) watery eyes Ears, nose, mouth, throat, and face: ( - ) mucositis, ( - ) sore throat Respiratory: ( - ) cough, ( - ) dyspnea, ( - ) wheezes Cardiovascular: ( - ) palpitation, ( - ) chest discomfort, ( - ) lower extremity swelling Gastrointestinal:  ( - ) nausea, ( - ) heartburn, ( - ) change in bowel habits Skin: ( - ) abnormal skin rashes Lymphatics: ( - ) new lymphadenopathy, ( - ) easy bruising Neurological: ( - ) numbness, ( - ) tingling, ( - ) new weaknesses Behavioral/Psych: ( - ) mood change, ( - ) new changes  All other systems were reviewed with the patient and are negative.  PHYSICAL EXAMINATION: ECOG PERFORMANCE STATUS: 1 - Symptomatic but completely ambulatory  Vitals:   07/29/22 0856  BP: (!) 160/77  Pulse: 67  Resp: 17  Temp: 97.7 F (36.5 C)  SpO2: 92%   Filed Weights   07/29/22 0856  Weight: 283 lb 11.2 oz (128.7 kg)    GENERAL: well appearing elderly Caucasian male in NAD  SKIN: skin color, texture, turgor are normal, no rashes or significant lesions EYES: conjunctiva are pink and non-injected, sclera clear LUNGS: clear to auscultation and percussion with normal breathing effort HEART: regular rate & rhythm and no murmurs and no lower extremity edema Musculoskeletal: no cyanosis of digits and no clubbing  PSYCH: alert & oriented x 3, fluent speech NEURO: no focal motor/sensory deficits  LABORATORY DATA:  I have reviewed the data as listed    Latest Ref Rng & Units 07/29/2022    9:42 AM 05/30/2022   12:52 PM 01/17/2022   12:28 PM  CBC  WBC 4.0 - 10.5 K/uL 6.8  7.6  7.1   Hemoglobin 13.0 - 17.0 g/dL 40.9  81.1  91.4   Hematocrit 39.0 - 52.0 % 43.7  46.9  38.4   Platelets 150 - 400 K/uL 156  150  165        Latest Ref Rng & Units 07/29/2022    9:42 AM 05/30/2022   12:52 PM 02/15/2022   12:13 PM  CMP  Glucose 70 - 99 mg/dL 782  956  213   BUN 8 - 23 mg/dL 19  21  16    Creatinine 0.61 - 1.24 mg/dL 0.86  5.78  4.69   Sodium 135 - 145 mmol/L  139  142  140   Potassium 3.5 - 5.1 mmol/L 3.8  4.5  4.5   Chloride 98 - 111 mmol/L 104  104  103   CO2 22 - 32 mmol/L 27  27  29    Calcium 8.9 - 10.3 mg/dL 9.5  9.4  9.1   Total Protein 6.5 - 8.1 g/dL 7.4     Total Bilirubin 0.3 - 1.2 mg/dL 0.9     Alkaline Phos 38 - 126 U/L 89     AST 15 - 41 U/L 22     ALT 0 - 44 U/L 27       RADIOGRAPHIC STUDIES: No results found.  ASSESSMENT &  PLAN Luis [name] 67 y.o. 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  After review of the labs, review of the records, and discussion with the patient the patients findings are most consistent with BCG unresponsive high risk non-muscle invasive bladder cancer, not a candidate for cystectomy.  # BCG-Unresponsive High Risk Non-Muscle Invasive Bladder Cancer --patient is not felt to be a candidate for cystectomy, was referred for consideration of pembrolizumab treatment -- Today discussed the risks and benefits of pembrolizumab treatment and he was willing and able to proceed with the treatment 200 mg q. 21 days until progression or intolerance up to 24 months. -- Defer to urology for management of hematuria and urinary frequency. -- Labs today to include baseline CBC and CMP.  We will also order baseline TSH -- Plan for return to clinic prior to the start of therapy.  #Supportive Care -- chemotherapy education to be scheduled  -- port placement not required  -- no pain medication required at this time.     Orders Placed This Encounter  Procedures   CBC with Differential (Cancer Center Only)    Standing Status:   Future    Number of Occurrences:   1    Standing Expiration Date:   07/29/2023   CMP (Cancer Center only)    Standing Status:   Future    Number of Occurrences:   1    Standing Expiration Date:   07/29/2023   TSH    Standing Status:   Future    Number of Occurrences:   1    Standing Expiration  Date:   07/29/2023   CBC with Differential (Cancer Center Only)    Standing Status:   Future    Standing Expiration Date:   08/12/2023   CMP (Cancer Center only)    Standing Status:   Future    Standing Expiration Date:   08/12/2023   T4    Standing Status:   Future    Standing Expiration Date:   08/12/2023   TSH    Standing Status:   Future    Standing Expiration Date:   08/12/2023    All questions were answered. The patient knows to call the clinic with any problems, questions or concerns.  A total of more than 60 minutes were spent on this encounter with face-to-face time and non-face-to-face time, including preparing to see the patient, ordering tests and/or medications, counseling the patient and coordination of care as outlined above.   Luis Barns, MD Department of Hematology/Oncology [facility] Phone: [phone] Pager: [phone] Email: [email or web address]  08/07/2022 8:30 PM  Literature Support:  Pembrolizumab monotherapy for the treatment of high-risk non-muscle-invasive bladder cancer unresponsive to BCG ([id number]): an open-label, single-arm, multicentre, phase 2 study,The Lancet Oncology,Volume 22, Issue 7,2021,Pages 919-930,ISSN 2956-2130  -- Pembrolizumab monotherapy was tolerable and showed promising antitumour activity in patients with BCG-unresponsive non-muscle-invasive bladder cancer who declined or were ineligible for radical cystectomy and should be considered a a clinically active non-surgical treatment option in this difficult-to-treat population.  --All enrolled patients were assigned to receive pembrolizumab 200 mg intravenously every 3 weeks for up to 24 months or until centrally confirmed disease persistence, recurrence, or progression; unacceptable toxic effects  --

## 2022-08-01 ENCOUNTER — Encounter: Payer: Self-pay | Admitting: Hematology and Oncology

## 2022-08-07 ENCOUNTER — Encounter: Payer: Self-pay | Admitting: Hematology and Oncology

## 2022-08-07 NOTE — Progress Notes (Signed)
START ON PATHWAY REGIMEN - Bladder     A cycle is every 21 days:     Pembrolizumab   **Always confirm dose/schedule in your pharmacy ordering system**  Patient Characteristics: Pre-Cystectomy or Nonsurgical Candidate, M0 (Clinical Staging), High-Grade cTa, cN0 or cTis/cT1, cN0, Refractory to Intravesical BCG and Referring Urologist Indicates Other Intravesical Therapies Not Preferred Therapeutic Status: Pre-Cystectomy or Nonsurgical Candidate, M0 (Clinical Staging) AJCC M Category: cM0 AJCC 8 Stage Grouping: I AJCC T Category: cT1 AJCC N Category: cN0 Intent of Therapy: Non-Curative / Palliative Intent, Discussed with Patient

## 2022-08-08 ENCOUNTER — Encounter: Payer: Self-pay | Admitting: Family Medicine

## 2022-08-08 ENCOUNTER — Ambulatory Visit (INDEPENDENT_AMBULATORY_CARE_PROVIDER_SITE_OTHER): Payer: PPO | Admitting: Family Medicine

## 2022-08-08 ENCOUNTER — Telehealth: Payer: Self-pay | Admitting: Hematology and Oncology

## 2022-08-08 VITALS — BP 120/74 | HR 60 | Temp 98.2°F | Wt 279.0 lb

## 2022-08-08 DIAGNOSIS — J029 Acute pharyngitis, unspecified: Secondary | ICD-10-CM | POA: Diagnosis not present

## 2022-08-08 DIAGNOSIS — J020 Streptococcal pharyngitis: Secondary | ICD-10-CM | POA: Diagnosis not present

## 2022-08-08 DIAGNOSIS — R059 Cough, unspecified: Secondary | ICD-10-CM

## 2022-08-08 LAB — POCT RAPID STREP A (OFFICE): Rapid Strep A Screen: POSITIVE — AB

## 2022-08-08 LAB — POC COVID19 BINAXNOW: SARS Coronavirus 2 Ag: NEGATIVE

## 2022-08-08 MED ORDER — CEFUROXIME AXETIL 500 MG PO TABS: 500.0000 mg | ORAL_TABLET | Freq: Two times a day (BID) | ORAL | 0 refills | Status: AC

## 2022-08-08 NOTE — Telephone Encounter (Signed)
Reached out to patient to schedule per WQ, patient aware of dates and times of appointments.

## 2022-08-08 NOTE — Progress Notes (Signed)
   Subjective:    Patient ID: Luis [name], male    DOB: [birth year], 67 y.o.   MRN: [id number]  HPI Here for one week of ST and a dry cough. No fever. No SOB or NVD. Taking Mucinex.    Review of Systems  Constitutional: Negative.   HENT:  Positive for congestion, postnasal drip and sore throat. Negative for ear pain and sinus pressure.   Eyes: Negative.   Respiratory:  Positive for cough. Negative for shortness of breath and wheezing.        Objective:   Physical Exam Constitutional:      Appearance: Normal appearance.  HENT:     Right Ear: Tympanic membrane, ear canal and external ear normal.     Left Ear: Tympanic membrane, ear canal and external ear normal.     Nose: Nose normal.     Mouth/Throat:     Pharynx: Oropharynx is clear.  Eyes:     Conjunctiva/sclera: Conjunctivae normal.  Pulmonary:     Effort: Pulmonary effort is normal.     Breath sounds: Normal breath sounds.  Lymphadenopathy:     Cervical: No cervical adenopathy.  Neurological:     Mental Status: He is alert.           Assessment & Plan:  Strep pharyngitis, treat with 10 days of Cefuroxime. He is scheduled to begin chemotherapy later this week for bladder cancer. I advised him to contact Dr. Derek Mound office about this infection in case they would want to postpone the therapy.  Gershon Crane, MD

## 2022-08-09 ENCOUNTER — Other Ambulatory Visit: Payer: Self-pay

## 2022-08-11 ENCOUNTER — Inpatient Hospital Stay: Payer: PPO

## 2022-08-12 ENCOUNTER — Other Ambulatory Visit: Payer: Self-pay | Admitting: Physician Assistant

## 2022-08-12 ENCOUNTER — Inpatient Hospital Stay: Payer: PPO

## 2022-08-12 ENCOUNTER — Inpatient Hospital Stay (HOSPITAL_BASED_OUTPATIENT_CLINIC_OR_DEPARTMENT_OTHER): Payer: PPO | Admitting: Physician Assistant

## 2022-08-12 ENCOUNTER — Other Ambulatory Visit: Payer: Self-pay

## 2022-08-12 VITALS — BP 129/77 | HR 56 | Temp 97.9°F | Resp 14

## 2022-08-12 VITALS — BP 146/80 | HR 64 | Temp 97.7°F | Resp 13 | Wt 280.5 lb

## 2022-08-12 DIAGNOSIS — Z5112 Encounter for antineoplastic immunotherapy: Secondary | ICD-10-CM

## 2022-08-12 DIAGNOSIS — C679 Malignant neoplasm of bladder, unspecified: Secondary | ICD-10-CM

## 2022-08-12 LAB — CMP (CANCER CENTER ONLY)
ALT: 24 U/L (ref 0–44)
AST: 22 U/L (ref 15–41)
Albumin: 4 g/dL (ref 3.5–5.0)
Alkaline Phosphatase: 89 U/L (ref 38–126)
Anion gap: 8 (ref 5–15)
BUN: 18 mg/dL (ref 8–23)
CO2: 26 mmol/L (ref 22–32)
Calcium: 8.9 mg/dL (ref 8.9–10.3)
Chloride: 104 mmol/L (ref 98–111)
Creatinine: 1.11 mg/dL (ref 0.61–1.24)
GFR, Estimated: >60 mL/min (ref >60)
Glucose, Bld: 187 mg/dL — ABNORMAL HIGH (ref 70–99)
Potassium: 3.7 mmol/L (ref 3.5–5.1)
Sodium: 138 mmol/L (ref 135–145)
Total Bilirubin: 0.7 mg/dL (ref 0.3–1.2)
Total Protein: 7.3 g/dL (ref 6.5–8.1)

## 2022-08-12 LAB — CBC WITH DIFFERENTIAL (CANCER CENTER ONLY)
Abs Immature Granulocytes: 0.06 10*3/uL (ref 0.00–0.07)
Basophils Absolute: 0 10*3/uL (ref 0.0–0.1)
Basophils Relative: 1 %
Eosinophils Absolute: 0.3 10*3/uL (ref 0.0–0.5)
Eosinophils Relative: 4 %
HCT: 42.1 % (ref 39.0–52.0)
Hemoglobin: 13.9 g/dL (ref 13.0–17.0)
Immature Granulocytes: 1 %
Lymphocytes Relative: 19 %
Lymphs Abs: 1.3 10*3/uL (ref 0.7–4.0)
MCH: 30 pg (ref 26.0–34.0)
MCHC: 33 g/dL (ref 30.0–36.0)
MCV: 90.9 fL (ref 80.0–100.0)
Monocytes Absolute: 0.3 10*3/uL (ref 0.1–1.0)
Monocytes Relative: 5 %
Neutro Abs: 4.8 10*3/uL (ref 1.7–7.7)
Neutrophils Relative %: 70 %
Platelet Count: 169 10*3/uL (ref 150–400)
RBC: 4.63 MIL/uL (ref 4.22–5.81)
RDW: 13.9 % (ref 11.5–15.5)
WBC Count: 6.8 10*3/uL (ref 4.0–10.5)
nRBC: 0 % (ref 0.0–0.2)

## 2022-08-12 LAB — TSH: TSH: 0.817 u[IU]/mL (ref 0.350–4.500)

## 2022-08-12 MED ORDER — SODIUM CHLORIDE 0.9 % IV SOLN
200.0000 mg | Freq: Once | INTRAVENOUS | Status: AC
  Administered 2022-08-12: 200 mg via INTRAVENOUS
  Filled 2022-08-12: qty 200

## 2022-08-12 MED ORDER — SODIUM CHLORIDE 0.9 % IV SOLN: Freq: Once | INTRAVENOUS | Status: AC

## 2022-08-12 NOTE — Patient Instructions (Signed)
[place]  Discharge Instructions: Thank you for choosing [facility] to provide your oncology and hematology care.   If you have a lab appointment with the Cancer Center, please go directly to the Cancer Center and check in at the registration area.   Wear comfortable clothing and clothing appropriate for easy access to any Portacath or PICC line.   We strive to give you quality time with your provider. You may need to reschedule your appointment if you arrive late (15 or more minutes).  Arriving late affects you and other patients whose appointments are after yours.  Also, if you miss three or more appointments without notifying the office, you may be dismissed from the clinic at the provider's discretion.      For prescription refill requests, have your pharmacy contact our office and allow 72 hours for refills to be completed.    Today you received the following chemotherapy and/or immunotherapy agents: Keytruda (pembrolizumab)      To help prevent nausea and vomiting after your treatment, we encourage you to take your nausea medication as directed.  BELOW ARE SYMPTOMS THAT SHOULD BE REPORTED IMMEDIATELY: *FEVER GREATER THAN 100.4 F (38 C) OR HIGHER *CHILLS OR SWEATING *NAUSEA AND VOMITING THAT IS NOT CONTROLLED WITH YOUR NAUSEA MEDICATION *UNUSUAL SHORTNESS OF BREATH *UNUSUAL BRUISING OR BLEEDING *URINARY PROBLEMS (pain or burning when urinating, or frequent urination) *BOWEL PROBLEMS (unusual diarrhea, constipation, pain near the anus) TENDERNESS IN MOUTH AND THROAT WITH OR WITHOUT PRESENCE OF ULCERS (sore throat, sores in mouth, or a toothache) UNUSUAL RASH, SWELLING OR PAIN  UNUSUAL VAGINAL DISCHARGE OR ITCHING   Items with * indicate a potential emergency and should be followed up as soon as possible or go to the Emergency Department if any problems should occur.  Please show the CHEMOTHERAPY ALERT CARD or IMMUNOTHERAPY  ALERT CARD at check-in to the Emergency Department and triage nurse.  Should you have questions after your visit or need to cancel or reschedule your appointment, please contact [facility] [place] [facility]  Dept: [phone]  and follow the prompts.  Office hours are 8:00 a.m. to 4:30 p.m. Monday - Friday. Please note that voicemails left after 4:00 p.m. may not be returned until the following business day.  We are closed weekends and major holidays. You have access to a nurse at all times for urgent questions. Please call the main number to the clinic Dept: [phone] and follow the prompts.   For any non-urgent questions, you may also contact your provider using MyChart. We now offer e-Visits for anyone 31 and older to request care online for non-urgent symptoms. For details visit [email or web address].   Also download the MyChart app! Go to the app store, search "MyChart", open the app, select [place], and log in with your MyChart username and password.  Pembrolizumab Injection What is this medication? PEMBROLIZUMAB (PEM broe LIZ ue mab) treats some types of cancer. It works by helping your immune system slow or stop the spread of cancer cells. It is a monoclonal antibody. This medicine may be used for other purposes; ask your health care provider or pharmacist if you have questions. COMMON BRAND NAME(S): Keytruda What should I tell my care team before I take this medication? They need to know if you have any of these conditions: Allogeneic stem cell transplant (uses someone else's stem cells) Autoimmune diseases, such as Crohn disease, ulcerative colitis, lupus History of chest radiation  Nervous system problems, such as Guillain-Barre syndrome, myasthenia gravis Organ transplant An unusual or allergic reaction to pembrolizumab, other medications, foods, dyes, or preservatives Pregnant or trying to get pregnant Breast-feeding How should I use this  medication? This medication is injected into a vein. It is given by your care team in a hospital or clinic setting. A special MedGuide will be given to you before each treatment. Be sure to read this information carefully each time. Talk to your care team about the use of this medication in children. While it may be prescribed for children as young as 6 months for selected conditions, precautions do apply. Overdosage: If you think you have taken too much of this medicine contact a poison control center or emergency room at once. NOTE: This medicine is only for you. Do not share this medicine with others. What if I miss a dose? Keep appointments for follow-up doses. It is important not to miss your dose. Call your care team if you are unable to keep an appointment. What may interact with this medication? Interactions have not been studied. This list may not describe all possible interactions. Give your health care provider a list of all the medicines, herbs, non-prescription drugs, or dietary supplements you use. Also tell them if you smoke, drink alcohol, or use illegal drugs. Some items may interact with your medicine. What should I watch for while using this medication? Your condition will be monitored carefully while you are receiving this medication. You may need blood work while taking this medication. This medication may cause serious skin reactions. They can happen weeks to months after starting the medication. Contact your care team right away if you notice fevers or flu-like symptoms with a rash. The rash may be red or purple and then turn into blisters or peeling of the skin. You may also notice a red rash with swelling of the face, lips, or lymph nodes in your neck or under your arms. Tell your care team right away if you have any change in your eyesight. Talk to your care team if you may be pregnant. Serious birth defects can occur if you take this medication during pregnancy and for 4  months after the last dose. You will need a negative pregnancy test before starting this medication. Contraception is recommended while taking this medication and for 4 months after the last dose. Your care team can help you find the option that works for you. Do not breastfeed while taking this medication and for 4 months after the last dose. What side effects may I notice from receiving this medication? Side effects that you should report to your care team as soon as possible: Allergic reactions--skin rash, itching, hives, swelling of the face, lips, tongue, or throat Dry cough, shortness of breath or trouble breathing Eye pain, redness, irritation, or discharge with blurry or decreased vision Heart muscle inflammation--unusual weakness or fatigue, shortness of breath, chest pain, fast or irregular heartbeat, dizziness, swelling of the ankles, feet, or hands Hormone gland problems--headache, sensitivity to light, unusual weakness or fatigue, dizziness, fast or irregular heartbeat, increased sensitivity to cold or heat, excessive sweating, constipation, hair loss, increased thirst or amount of urine, tremors or shaking, irritability Infusion reactions--chest pain, shortness of breath or trouble breathing, feeling faint or lightheaded Kidney injury (glomerulonephritis)--decrease in the amount of urine, red or dark brown urine, foamy or bubbly urine, swelling of the ankles, hands, or feet Liver injury--right upper belly pain, loss of appetite, nausea, light-colored stool, dark yellow  or brown urine, yellowing skin or eyes, unusual weakness or fatigue Pain, tingling, or numbness in the hands or feet, muscle weakness, change in vision, confusion or trouble speaking, loss of balance or coordination, trouble walking, seizures Rash, fever, and swollen lymph nodes Redness, blistering, peeling, or loosening of the skin, including inside the mouth Sudden or severe stomach pain, bloody diarrhea, fever, nausea,  vomiting Side effects that usually do not require medical attention (report to your care team if they continue or are bothersome): Bone, joint, or muscle pain Diarrhea Fatigue Loss of appetite Nausea Skin rash This list may not describe all possible side effects. Call your doctor for medical advice about side effects. You may report side effects to FDA at 1-800-FDA-1088. Where should I keep my medication? This medication is given in a hospital or clinic. It will not be stored at home. NOTE: This sheet is a summary. It may not cover all possible information. If you have questions about this medicine, talk to your doctor, pharmacist, or health care provider.  2023 [employer]/Gold Standard (2021-08-24 00:00:00)

## 2022-08-12 NOTE — Progress Notes (Signed)
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 [name]  08/12/2022: Cycle 1 of Pembrolizumab  CHIEF COMPLAINTS/PURPOSE OF CONSULTATION:  "High Risk Non-Muscle Invasive Bladder Cancer "  HISTORY OF PRESENTING ILLNESS:  Luis [name] 66 y.o. male with medical history significant for BCG unresponsive high risk nonmuscle invasive bladder cancer who presents for a follow up before starting Cycle 1 of Pembrolizumab therapy. He is accompanied by his wife for this visit.   On exam today on exam today Luis [name] reports that he is overall stable since the last visit without any changes to his health. His energy is stable and he continues to complete his ADLs on his own. He denies any appetite or weight changes. He denies nausea, vomiting or abdominal pain. His bowel habits are unchanged without recurrent episodes of diarrhea or constipation. He continues to persistent urinary symptoms including urinary frequency and intermittent episodes of hematuria. He denies fevers, chills, sweats, shortness of breath, chest pain or cough. He has no other complaints. A full 10 point  ROS is otherwise negative.  MEDICAL HISTORY:  Past Medical History:  Diagnosis Date   Anxiety    takes Valium as needed   Aortic stenosis, moderate    Arthritis    Ascending aortic aneurysm    CAD (coronary artery disease)    a. s/p PCI of the RCA 8/12 with DES by Dr Excell Seltzer, preserved EF. b. LHC/RHC (2/16) with mean RA 12, PA 32/15, mean PCWP 18, CI 3.47; patent mid and distal RCA stents, 50-60% proximal stenosis small PDA.      Cancer    bladder   Carotid stenosis    a. Carotid US (05/2013):  Bilateral 1-39% ICA; L thyroid nodule (prior hx of aspiration).   Chronic diastolic CHF (congestive heart failure)    Complication of anesthesia    difficulty waking up after gallbladder surgery   Depression    Dyspnea    Essential hypertension    GERD (gastroesophageal reflux disease)    if needed will take OTC meds    Heart murmur    History of colonic polyps    hyperplastic   Hyperlipidemia    Joint pain    Lesion of bladder    Myocardial infarction 2012   Obesity (BMI 30-39.9) 02/29/2016   Pre-diabetes    Restless leg    Sleep apnea    uses cpap   Tubular adenoma of colon    Vertigo    takes Meclizine as needed    SURGICAL HISTORY: Past Surgical History:  Procedure Laterality Date   AORTIC VALVE REPLACEMENT N/A 09/16/2021   Procedure: AORTIC VALVE REPLACEMENT (AVR);  Surgeon: Alleen Borne, MD;  Location: [facility] OR;  Service: Open Heart Surgery;  Laterality: N/A;   CATARACT EXTRACTION   4  YRS AGO   BOTH EYES   CHOLECYSTECTOMY  07/21/2011   Procedure: LAPAROSCOPIC CHOLECYSTECTOMY WITH INTRAOPERATIVE CHOLANGIOGRAM;  Surgeon: Kandis Cocking, MD;  Location: [facility] ORS;  Service: General;  Laterality: N/A;   CORONARY ANGIOPLASTY  2012   2 stents   coronary stenting     s/p PCI of the RCA by Dr Excell Seltzer 8/12 with 2 promus stents   CYSTOSCOPY W/ RETROGRADES Bilateral 12/13/2019   Procedure: CYSTOSCOPY WITH RETROGRADE PYELOGRAM;  Surgeon: Sebastian Ache, MD;  Location: [facility];  Service: Urology;  Laterality: Bilateral;   CYSTOSCOPY W/ RETROGRADES Bilateral 10/14/2020   Procedure: CYSTOSCOPY WITH RETROGRADE PYELOGRAM;  Surgeon: Sebastian Ache, MD;  Location: [facility];  Service: Urology;  Laterality: Bilateral;   CYSTOSCOPY W/ RETROGRADES Bilateral 12/16/2020   Procedure: CYSTOSCOPY WITH RETROGRADE PYELOGRAM;  Surgeon: Sebastian Ache, MD;  Location: [facility];  Service: Urology;  Laterality: Bilateral;   CYSTOSCOPY W/ RETROGRADES Bilateral 06/03/2022   Procedure: CYSTOSCOPY WITH RETROGRADE PYELOGRAM;  Surgeon: Sebastian Ache, MD;  Location: [facility];  Service: Urology;  Laterality: Bilateral;   LEFT AND RIGHT HEART CATHETERIZATION WITH CORONARY ANGIOGRAM N/A 06/23/2014   Procedure: LEFT AND RIGHT HEART CATHETERIZATION WITH CORONARY ANGIOGRAM;  Surgeon: Laurey Morale, MD;  Location: [facility] CATH LAB;  Service: Cardiovascular;  Laterality: N/A;   NECK SURGERY  03/23/09   per Dr. Ophelia Charter, cervical fusion    PERICARDIOCENTESIS N/A 09/28/2021   Procedure: PERICARDIOCENTESIS;  Surgeon: Corky Crafts, MD;  Location: [facility]VAS[facility] CV LAB;  Service: Cardiovascular;  Laterality: N/A;   REPLACEMENT ASCENDING AORTA N/A 09/16/2021   Procedure: REPLACEMENT ASCENDING AORTA WITH 30 X HEMASHIELD PLATINUM WOVEN DOUBLE VELOUR VASCULAR GRAFT;  Surgeon: Alleen Borne, MD;  Location: [facility] OR;  Service: Open Heart Surgery;  Laterality: N/A;  CIRC ARREST   right elbow surgery     RIGHT HEART CATH AND CORONARY ANGIOGRAPHY N/A 07/08/2021   Procedure: RIGHT HEART CATH AND CORONARY ANGIOGRAPHY;  Surgeon: Laurey Morale, MD;  Location: [facility] INVASIVE CV LAB;  Service: Cardiovascular;  Laterality: N/A;   solonscopy  05/23/08   per Dr. Celene Kras hemorrhoids only, repeat in 5 years   SUBXYPHOID PERICARDIAL WINDOW N/A 09/28/2021   Procedure: SUBXYPHOID PERICARDIAL WINDOW;  Surgeon: Alleen Borne, MD;  Location: [facility] OR;  Service: Thoracic;   Laterality: N/A;   TEE WITHOUT CARDIOVERSION N/A 06/23/2014   Procedure: TRANSESOPHAGEAL ECHOCARDIOGRAM (TEE);  Surgeon: Laurey Morale, MD;  Location: [facility] ENDOSCOPY;  Service: Cardiovascular;  Laterality: N/A;   TEE WITHOUT CARDIOVERSION N/A 01/21/2016   Procedure: TRANSESOPHAGEAL ECHOCARDIOGRAM (TEE);  Surgeon: Laurey Morale, MD;  Location: [facility] ENDOSCOPY;  Service: Cardiovascular;  Laterality: N/A;   TEE WITHOUT CARDIOVERSION N/A 09/16/2021   Procedure: TRANSESOPHAGEAL ECHOCARDIOGRAM (TEE);  Surgeon: Alleen Borne, MD;  Location: [facility] OR;  Service: Open Heart Surgery;  Laterality: N/A;   TEE WITHOUT CARDIOVERSION N/A 09/28/2021   Procedure: TRANSESOPHAGEAL ECHOCARDIOGRAM (TEE);  Surgeon: Alleen Borne, MD;  Location: [facility] OR;  Service: Thoracic;  Laterality: N/A;   TONSILLECTOMY     TRANSURETHRAL RESECTION OF BLADDER TUMOR N/A 10/21/2019   Procedure: TRANSURETHRAL RESECTION OF BLADDER TUMOR (TURBT);  Surgeon: Ihor Gully, MD;  [facility];  Service: Urology;  Laterality: N/A;   TRANSURETHRAL RESECTION OF BLADDER TUMOR N/A 12/13/2019   Procedure: TRANSURETHRAL RESECTION OF BLADDER TUMOR (TURBT);  Surgeon: Sebastian Ache, MD;  Location: [facility];  Service: Urology;  Laterality: N/A;  1  HR   TRANSURETHRAL RESECTION OF BLADDER TUMOR N/A 10/14/2020   Procedure: TRANSURETHRAL RESECTION OF BLADDER TUMOR (TURBT);  Surgeon: Sebastian Ache, MD;  Location: [facility];  Service: Urology;  Laterality: N/A;   TRANSURETHRAL RESECTION OF BLADDER TUMOR N/A 12/16/2020   Procedure: RESTAGING TRANSURETHRAL RESECTION OF BLADDER TUMOR (TURBT);  Surgeon: Sebastian Ache, MD;  Location: [facility];  Service: Urology;  Laterality: N/A;   TRANSURETHRAL RESECTION OF BLADDER TUMOR N/A 06/03/2022   Procedure: TRANSURETHRAL RESECTION OF BLADDER TUMOR (TURBT);  Surgeon: Sebastian Ache, MD;  Location: [facility];  Service: Urology;  Laterality: N/A;    SOCIAL  HISTORY: Social History   Socioeconomic History   Marital status: Married    Spouse name: Not on file   Number of children: 4   Years of education: Not on file   Highest education level: Not on file  Occupational History   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  Tobacco Use   Smoking status: Former    Packs/day: 2.00    Years: 40.00    Additional pack years: 0.00    Total pack years: 80.00    Types: Cigarettes    Quit date: 2012    Years since quitting: 12.3   Smokeless tobacco: Never  Vaping Use   Vaping Use: Never used  Substance and Sexual Activity   Alcohol use: No    Alcohol/week: 0.0 standard drinks of alcohol   Drug use: No   Sexual activity: Not on file  Other Topics Concern   Not on file  Social History Narrative   Not on file   Social Determinants of Health   Financial Resource Strain: Low Risk  (08/18/2021)   Overall Financial Resource Strain (CARDIA)    Difficulty of Paying Living Expenses: Not hard at all  Food Insecurity: No Food Insecurity (08/18/2021)   Hunger Vital Sign    Worried About Running Out of Food in the Last Year: Never true    Ran Out of Food in the Last Year: Never true  Transportation Needs: No Transportation Needs (08/18/2021)   PRAPARE - Administrator, Civil Service (Medical): No    Lack of Transportation (Non-Medical): No  Physical Activity: Insufficiently Active (08/18/2021)   Exercise Vital Sign    Days of Exercise per Week: 3 days    Minutes of Exercise per Session: 30 min  Stress: No Stress Concern Present (08/18/2021)   [employer] of Occupational Health - Occupational Stress Questionnaire    Feeling of Stress : Not at all  Social Connections: Moderately Isolated (08/18/2021)   Social Connection and Isolation Panel [NHANES]    Frequency of Communication with Friends and Family: More than three times a week    Frequency of Social Gatherings with Friends and Family: More than three  times a week    Attends Religious Services: Never    Database administrator or Organizations: No    Attends Banker Meetings: Never    Marital Status: Married  Catering manager Violence: Not At Risk (08/18/2021)   Humiliation, Afraid, Rape, and Kick questionnaire    Fear of Current or Ex-Partner: No    Emotionally Abused: No    Physically Abused: No    Sexually Abused: No    FAMILY HISTORY: Family History  Problem Relation Age of Onset   Lung cancer Mother        lung   Esophageal cancer Cousin    Colon  cancer Neg Hx    Rectal cancer Neg Hx    Stomach cancer Neg Hx     ALLERGIES:  has No Known Allergies.  MEDICATIONS:  Current Outpatient Medications  Medication Sig Dispense Refill   acetaminophen (TYLENOL) 500 MG tablet Take 1-2 tablets (500-1,000 mg total) by mouth every 6 (six) hours as needed for moderate pain. (Patient taking differently: Take 1,000 mg by mouth every 6 (six) hours as needed for moderate pain.) 30 tablet 0   aspirin 81 MG tablet Take 81 mg by mouth every morning.     carvedilol (COREG) 3.125 MG tablet TAKE 1 TABLET BY MOUTH TWICE A DAY WITH A MEAL (Patient taking differently: Take 3.125 mg by mouth 2 (two) times daily with a meal.) 180 tablet 1   cefUROXime (CEFTIN) 500 MG tablet Take 1 tablet (500 mg total) by mouth 2 (two) times daily with a meal for 10 days. 20 tablet 0   diazepam (VALIUM) 5 MG tablet Take 1 tablet (5 mg total) by mouth every 12 (twelve) hours as needed for anxiety. 60 tablet 2   digoxin (LANOXIN) 0.25 MG tablet Take 1 tablet by mouth daily.     empagliflozin (JARDIANCE) 10 MG TABS tablet Take 1 tablet (10 mg total) by mouth daily before breakfast. 30 tablet 11   Ferrous Sulfate (IRON PO) Take 1 tablet by mouth daily. 1 tab daily     furosemide (LASIX) 20 MG tablet TAKE 2 TABLETS (40 MG TOTAL) BY MOUTH EVERY MORNING AND 1 TABLET (20 MG TOTAL) EVERY EVENING. 270 tablet 1   ibuprofen (ADVIL) 200 MG tablet Take by mouth.      ipratropium (ATROVENT) 0.03 % nasal spray Place 2 sprays into both nostrils every 12 (twelve) hours as needed for rhinitis.     KLOR-CON M20 20 MEQ tablet TAKE 2 TABLETS BY MOUTH DAILY 180 tablet 1   Magnesium 500 MG TABS Take 500 mg by mouth daily.     meclizine (ANTIVERT) 25 MG tablet Take 1 tablet (25 mg total) by mouth 3 (three) times daily as needed for dizziness. 30 tablet 0   multivitamin-iron-minerals-folic acid (CENTRUM) chewable tablet Chew 1 tablet by mouth daily.     nitroGLYCERIN (NITROSTAT) 0.4 MG SL tablet Place under the tongue.     phenazopyridine (PYRIDIUM) 200 MG tablet Take 200 mg by mouth every 8 (eight) hours as needed. AZO     rosuvastatin (CRESTOR) 20 MG tablet TAKE 1 TABLET BY MOUTH EVERYDAY AT BEDTIME (Patient taking differently: Take 20 mg by mouth at bedtime.) 90 tablet 3   tamsulosin (FLOMAX) 0.4 MG CAPS capsule Take 0.4 mg by mouth daily.     traMADol (ULTRAM) 50 MG tablet Take 1 tablet (50 mg total) by mouth every 6 (six) hours as needed for moderate pain. 14 tablet 0   venlafaxine XR (EFFEXOR-XR) 150 MG 24 hr capsule Take 1 capsule (150 mg total) by mouth daily with breakfast. 90 capsule 0   No current facility-administered medications for this visit.    REVIEW OF SYSTEMS:   Constitutional: ( - ) fevers, ( - )  chills , ( - ) night sweats Eyes: ( - ) blurriness of vision, ( - ) double vision, ( - ) watery eyes Ears, nose, mouth, throat, and face: ( - ) mucositis, ( - ) sore throat Respiratory: ( - ) cough, ( - ) dyspnea, ( - ) wheezes Cardiovascular: ( - ) palpitation, ( - ) chest discomfort, ( - ) lower extremity  swelling Gastrointestinal:  ( - ) nausea, ( - ) heartburn, ( - ) change in bowel habits Skin: ( - ) abnormal skin rashes Lymphatics: ( - ) new lymphadenopathy, ( - ) easy bruising Neurological: ( - ) numbness, ( - ) tingling, ( - ) new weaknesses Behavioral/Psych: ( - ) mood change, ( - ) new changes  All other systems were reviewed with the patient  and are negative.  PHYSICAL EXAMINATION: ECOG PERFORMANCE STATUS: 1 - Symptomatic but completely ambulatory  Vitals:   08/12/22 0838  BP: (!) 146/80  Pulse: 64  Resp: 13  Temp: 97.7 F (36.5 C)  SpO2: 92%   Filed Weights   08/12/22 0838  Weight: 280 lb 8 oz (127.2 kg)    GENERAL: well appearing elderly Caucasian male in NAD  SKIN: skin color, texture, turgor are normal, no rashes or significant lesions EYES: conjunctiva are pink and non-injected, sclera clear LUNGS: clear to auscultation and percussion with normal breathing effort HEART: regular rate & rhythm and no murmurs and no lower extremity edema Musculoskeletal: no cyanosis of digits and no clubbing  PSYCH: alert & oriented x 3, fluent speech NEURO: no focal motor/sensory deficits  LABORATORY DATA:  I have reviewed the data as listed    Latest Ref Rng & Units 08/12/2022    7:47 AM 07/29/2022    9:42 AM 05/30/2022   12:52 PM  CBC  WBC 4.0 - 10.5 K/uL 6.8  6.8  7.6   Hemoglobin 13.0 - 17.0 g/dL 16.1  09.6  04.5   Hematocrit 39.0 - 52.0 % 42.1  43.7  46.9   Platelets 150 - 400 K/uL 169  156  150        Latest Ref Rng & Units 08/12/2022    7:47 AM 07/29/2022    9:42 AM 05/30/2022   12:52 PM  CMP  Glucose 70 - 99 mg/dL 409  811  914   BUN 8 - 23 mg/dL 18  19  21    Creatinine 0.61 - 1.24 mg/dL 7.82  9.56  2.13   Sodium 135 - 145 mmol/L 138  139  142   Potassium 3.5 - 5.1 mmol/L 3.7  3.8  4.5   Chloride 98 - 111 mmol/L 104  104  104   CO2 22 - 32 mmol/L 26  27  27    Calcium 8.9 - 10.3 mg/dL 8.9  9.5  9.4   Total Protein 6.5 - 8.1 g/dL 7.3  7.4    Total Bilirubin 0.3 - 1.2 mg/dL 0.7  0.9    Alkaline Phos 38 - 126 U/L 89  89    AST 15 - 41 U/L 22  22    ALT 0 - 44 U/L 24  27      RADIOGRAPHIC STUDIES: No results found.  ASSESSMENT & PLAN Luis [name] 67 y.o. 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  After review  of the labs, review of the records, and discussion with the patient the patients findings are most consistent with BCG unresponsive high risk non-muscle invasive bladder cancer, not a candidate for cystectomy.  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 21 days until progression or intolerance up to 24 months. PLAN: --Due to start Cycle 1 of Pembrolizumab therapy today --Labs from today were reviewed and adequate for treatment.  No cytopenias. Creatinine and LFTs normal. Thyroid panel pending. --Proceed with treatment today without any modifications --RTC in 3 weeks with labs and follow up visit before Cycle 2.  #Urinary frequency and hematuria: -- Defer to urology for further evaluation and management  #Supportive Care -- chemotherapy education to be scheduled  -- port placement not required  -- no pain medication required at this time.     No orders of the defined types were placed in this encounter.   All questions were answered. The patient knows to call the clinic with any problems, questions or concerns.  I have spent a total of 30 minutes minutes of face-to-face and non-face-to-face time, preparing to see the patient, performing a medically appropriate examination, counseling and educating the patient, documenting clinical information in the electronic health record,and care coordination.   [name] PA-C Dept of Hematology and Oncology [facility] Phone: [phone]   08/12/2022 9:05 AM  Literature Support:  Pembrolizumab monotherapy for the treatment of high-risk non-muscle-invasive bladder cancer unresponsive to BCG [facility]): an open-label, single-arm, multicentre, phase 2 study,The Lancet Oncology,Volume 22, Issue 7,2021,Pages 919-930,ISSN 0981-1914  -- Pembrolizumab monotherapy was tolerable and showed  promising antitumour activity in patients with BCG-unresponsive non-muscle-invasive bladder cancer who declined or were ineligible for radical cystectomy and should be considered a a clinically active non-surgical treatment option in this difficult-to-treat population.  --All enrolled patients were assigned to receive pembrolizumab 200 mg intravenously every 3 weeks for up to 24 months or until centrally confirmed disease persistence, recurrence, or progression; unacceptable toxic effects  --

## 2022-08-13 ENCOUNTER — Other Ambulatory Visit: Payer: Self-pay

## 2022-08-13 ENCOUNTER — Other Ambulatory Visit: Payer: Self-pay | Admitting: Hematology and Oncology

## 2022-08-13 DIAGNOSIS — C679 Malignant neoplasm of bladder, unspecified: Secondary | ICD-10-CM

## 2022-08-13 LAB — PROSTATE-SPECIFIC AG, SERUM (LABCORP): Prostate Specific Ag, Serum: 1.7 ng/mL (ref 0.0–4.0)

## 2022-08-14 LAB — T4: T4, Total: 7.5 ug/dL (ref 4.5–12.0)

## 2022-08-15 ENCOUNTER — Telehealth: Payer: Self-pay | Admitting: *Deleted

## 2022-08-15 NOTE — Telephone Encounter (Signed)
-----   Message from Mickeal Needy, RN sent at 08/12/2022 11:22 AM EDT ----- Regarding: First time 08/12/22 Dr. [name]- first time [place] for bladder Ca- tolerated well.

## 2022-08-16 ENCOUNTER — Other Ambulatory Visit: Payer: Self-pay

## 2022-08-17 ENCOUNTER — Other Ambulatory Visit: Payer: Self-pay | Admitting: Family Medicine

## 2022-08-17 ENCOUNTER — Other Ambulatory Visit (HOSPITAL_COMMUNITY): Payer: Self-pay | Admitting: Cardiology

## 2022-08-17 DIAGNOSIS — F411 Generalized anxiety disorder: Secondary | ICD-10-CM

## 2022-08-21 ENCOUNTER — Other Ambulatory Visit (HOSPITAL_COMMUNITY): Payer: Self-pay | Admitting: Family Medicine

## 2022-08-28 IMAGING — DX DG CHEST 2V
2 series · 2 of 2 positions shown · non-contrast
Comparison: 06/03/2021

CLINICAL DATA: Pre-procedure chest x-ray for TAVR.

EXAM:
CHEST - 2 VIEW

[w chest pa]
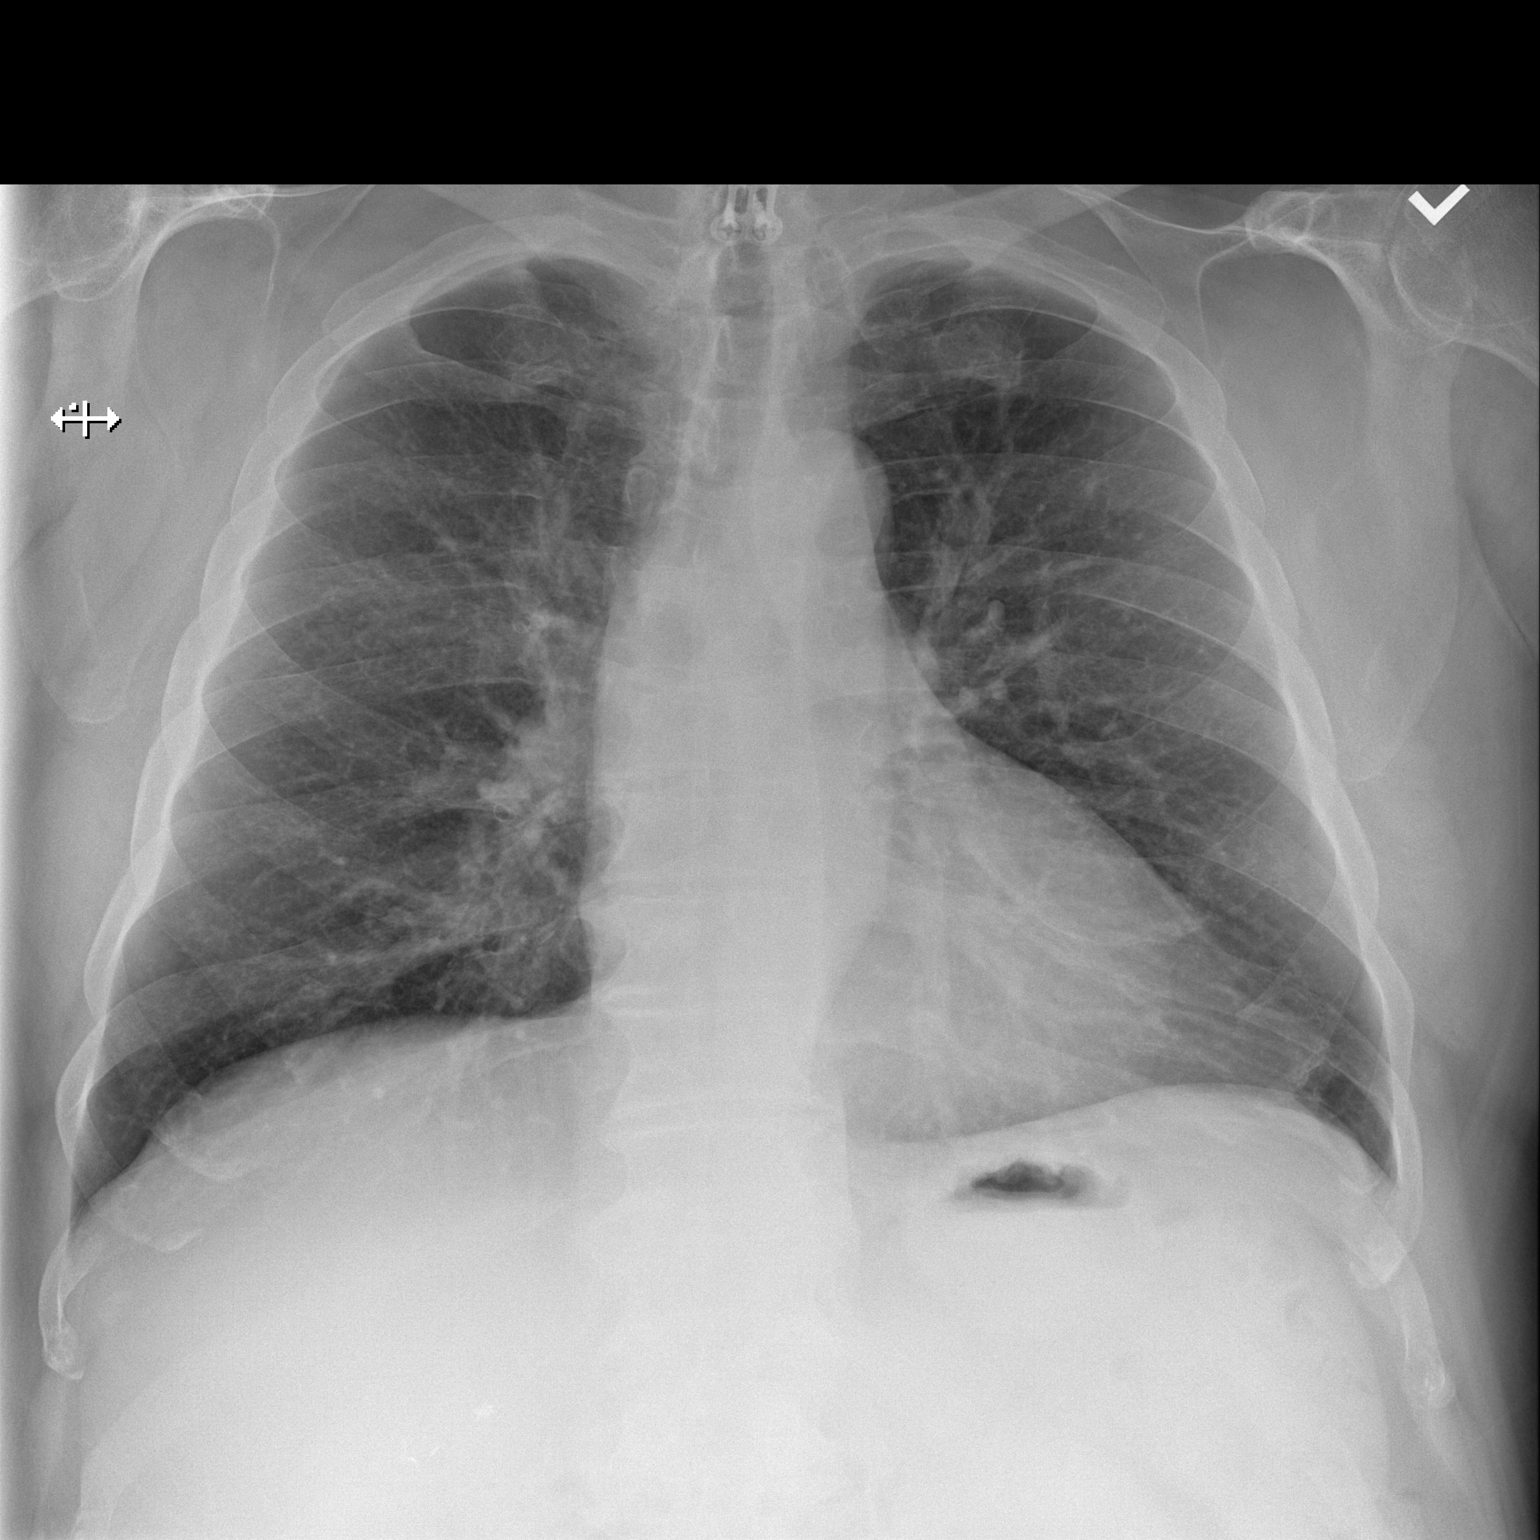

[w chest lat]
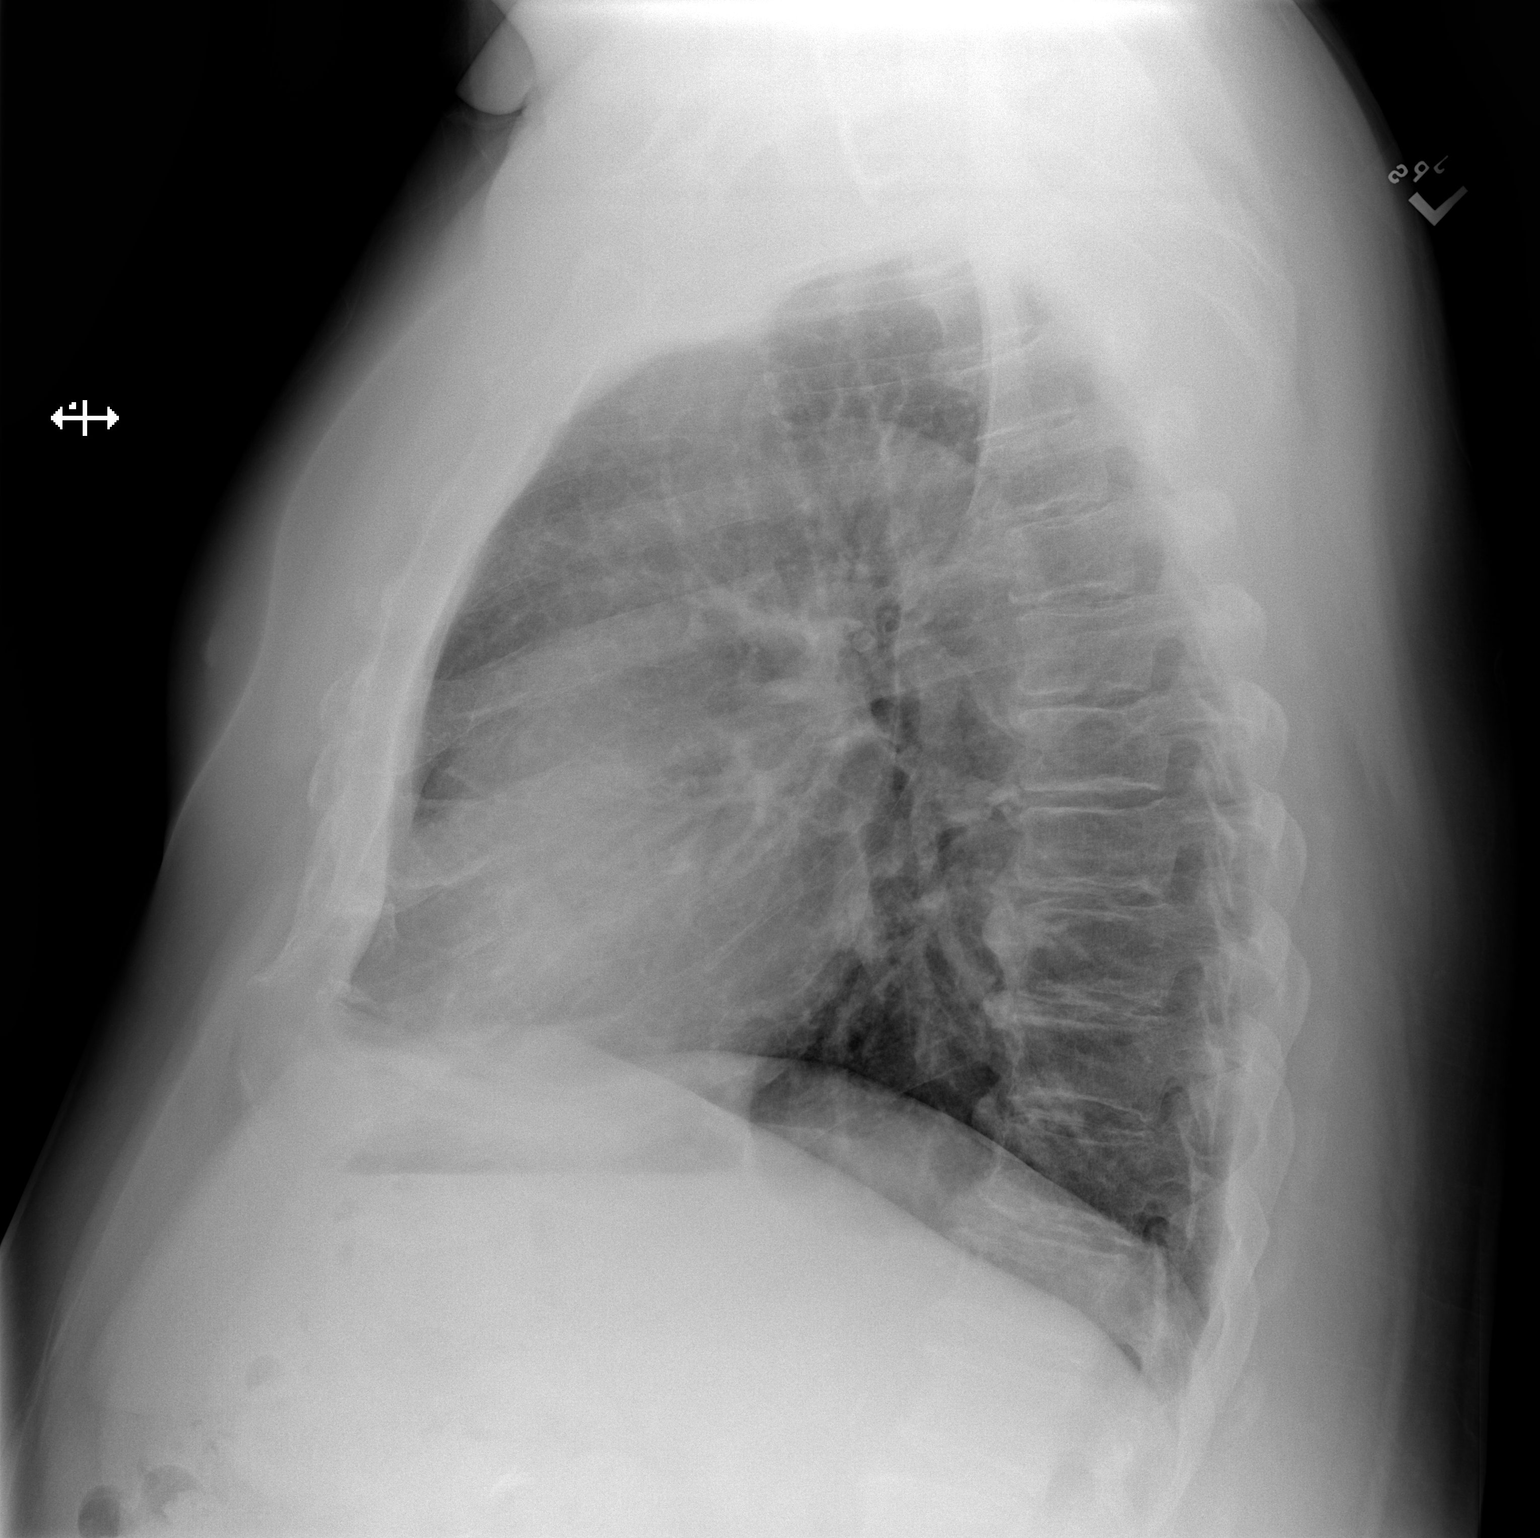

[2 of 2 positions shown; findings below may reference images not displayed]

FINDINGS: The cardiopericardial silhouette is within normal limits for size.
The lungs are clear without focal pneumonia, edema, pneumothorax or
pleural effusion. Interstitial markings are diffusely coarsened with
chronic features. The visualized bony structures of the thorax are
unremarkable. Anterior fusion hardware noted lower cervical spine.
IMPRESSION: No active cardiopulmonary disease.

## 2022-08-30 DIAGNOSIS — G4733 Obstructive sleep apnea (adult) (pediatric): Secondary | ICD-10-CM | POA: Diagnosis not present

## 2022-08-31 ENCOUNTER — Telehealth: Payer: Self-pay | Admitting: Family Medicine

## 2022-08-31 NOTE — Telephone Encounter (Signed)
Called patient to schedule Medicare Annual Wellness Visit (AWV). Left message for patient to call back and schedule Medicare Annual Wellness Visit (AWV).  Last date of AWV: 08/18/21  Please schedule an appointment at any time with [facility].  If any questions, please contact me at [phone].  Thank you ,  Rudell Cobb AWV direct phone # [phone]

## 2022-09-01 ENCOUNTER — Other Ambulatory Visit: Payer: Self-pay

## 2022-09-01 ENCOUNTER — Telehealth: Payer: Self-pay

## 2022-09-01 NOTE — Progress Notes (Signed)
Care Management & Coordination Services Pharmacy Team  Reason for Encounter: Hypertension  Contacted patient to discuss hypertension disease state. Unsuccessful outreach. Left voicemail for patient to return call. Multiple attempts  Current antihypertensive regimen:  Carvedilol 3.125 mg twice daily  Patient verbally confirms he is taking the above medications as directed.    How often are you checking your Blood Pressure?    he checks his blood pressure   taking his medication.   Current home BP readings:  DATE:              BP               PULSE    Wrist or arm cuff:   OTC medications including pseudoephedrine or NSAIDs?   Any readings above 180/100?  If yes any symptoms of hypertensive emergency?    What recent interventions/DTPs have been made by any provider to improve Blood Pressure control since last CPP Visit:    Any recent hospitalizations or ED visits since last visit with CPP?    What diet changes have been made to improve Blood Pressure Control?  Patient follows Breakfast -  Lunch -  Dinner -  Caffeine intake: Salt intake:   What exercise is being done to improve your Blood Pressure Control?    Adherence Review: Is the patient currently on ACE/ARB medication? No Does the patient have >5 day gap between last estimated fill dates? No  Care Gaps: AWV - completed 08/19/2022 Last A1C - 5.9 on 05/30/2022 Pneumovax - never done Urine ACR - never done Hep C Screen - never done Tdap - never done Shingrix - never done Covid overdue   Star Rating Drugs: Rosuvastatin 20mg  - last filled 06/11/2022 90 DS at [facility]  Chart Updates: Recent office visits:  08/08/2022 Gershon Crane MD - Patient was seen for strep pharyngitis and additional concerns. Started Ceftin 500 mg bid.    Recent consult visits:  08/12/2022 [name] PA-C (hem/onc) - Patient was seen for Malignant neoplasm of urinary bladder, unspecified site and an additional concerns. No medication  changes.   07/29/2022 Laurel Dimmer MD (hem/onc) - Patient was seen for Malignant neoplasm of urinary bladder, unspecified site.  No medication changes.  Hospital visits:  None  Medications: Outpatient Encounter Medications as of 09/01/2022  Medication Sig   acetaminophen (TYLENOL) 500 MG tablet Take 1-2 tablets (500-1,000 mg total) by mouth every 6 (six) hours as needed for moderate pain. (Patient taking differently: Take 1,000 mg by mouth every 6 (six) hours as needed for moderate pain.)   aspirin 81 MG tablet Take 81 mg by mouth every morning.   carvedilol (COREG) 3.125 MG tablet TAKE 1 TABLET BY MOUTH TWICE A DAY WITH A MEAL (Patient taking differently: Take 3.125 mg by mouth 2 (two) times daily with a meal.)   diazepam (VALIUM) 5 MG tablet Take 1 tablet (5 mg total) by mouth every 12 (twelve) hours as needed for anxiety.   digoxin (LANOXIN) 0.25 MG tablet Take 1 tablet by mouth daily.   empagliflozin (JARDIANCE) 10 MG TABS tablet Take 1 tablet (10 mg total) by mouth daily before breakfast.   Ferrous Sulfate (IRON PO) Take 1 tablet by mouth daily. 1 tab daily   furosemide (LASIX) 20 MG tablet Take 1 tablet (20 mg total) by mouth as directed. NEEDS FOLLOW UP APPOINTMENT FOR MORE REFILLS   ibuprofen (ADVIL) 200 MG tablet Take by mouth.   ipratropium (ATROVENT) 0.03 % nasal spray Place 2 sprays into  both nostrils every 12 (twelve) hours as needed for rhinitis.   KLOR-CON M20 20 MEQ tablet TAKE 2 TABLETS BY MOUTH EVERY DAY   Magnesium 500 MG TABS Take 500 mg by mouth daily.   meclizine (ANTIVERT) 25 MG tablet Take 1 tablet (25 mg total) by mouth 3 (three) times daily as needed for dizziness.   multivitamin-iron-minerals-folic acid (CENTRUM) chewable tablet Chew 1 tablet by mouth daily.   nitroGLYCERIN (NITROSTAT) 0.4 MG SL tablet Place under the tongue.   phenazopyridine (PYRIDIUM) 200 MG tablet Take 200 mg by mouth every 8 (eight) hours as needed. AZO   rosuvastatin (CRESTOR) 20 MG tablet  TAKE 1 TABLET BY MOUTH EVERYDAY AT BEDTIME (Patient taking differently: Take 20 mg by mouth at bedtime.)   tamsulosin (FLOMAX) 0.4 MG CAPS capsule Take 0.4 mg by mouth daily.   traMADol (ULTRAM) 50 MG tablet Take 1 tablet (50 mg total) by mouth every 6 (six) hours as needed for moderate pain.   venlafaxine XR (EFFEXOR-XR) 150 MG 24 hr capsule TAKE 1 CAPSULE BY MOUTH DAILY WITH BREAKFAST.   No facility-administered encounter medications on file as of 09/01/2022.  Fill History:  Dispensed Days Supply Quantity Provider Pharmacy  CARVEDILOL 3.125 MG TABLET 06/22/2022 90 180 each      Dispensed Days Supply Quantity Provider Pharmacy  DIAZEPAM 5 MG TABLET 05/23/2022 30 60 each      Dispensed Days Supply Quantity Provider Pharmacy  FUROSEMIDE 20 MG TABLET 09/01/2022 90 270 tablet      Dispensed Days Supply Quantity Provider Pharmacy  PHENAZOPYRIDINE 200 MG TAB 07/05/2022 30 90 each      Dispensed Days Supply Quantity Provider Pharmacy  ROSUVASTATIN CALCIUM 20 MG TAB 06/11/2022 90 90 each      Dispensed Days Supply Quantity Provider Pharmacy  TAMSULOSIN 0.4 MG CAPSULE 08/30/2022 90 90 capsule      Dispensed Days Supply Quantity Provider Pharmacy  TRAMADOL HCL 50 MG TABLET 10/03/2021 3 14 each      Dispensed Days Supply Quantity Provider Pharmacy  VENLAFAXINE HCL ER 150 MG CAP 08/18/2022 90 90 each     Recent Office Vitals: BP Readings from Last 3 Encounters:  08/12/22 129/77  08/12/22 (!) 146/80  08/08/22 120/74   Pulse Readings from Last 3 Encounters:  08/12/22 (!) 56  08/12/22 64  08/08/22 60    Wt Readings from Last 3 Encounters:  08/12/22 280 lb 8 oz (127.2 kg)  08/08/22 279 lb (126.6 kg)  07/29/22 283 lb 11.2 oz (128.7 kg)     Kidney Function Lab Results  Component Value Date/Time   CREATININE 1.11 08/12/2022 07:47 AM   CREATININE 1.03 07/29/2022 09:42 AM   CREATININE 1.08 01/14/2016 02:31 PM   CREATININE 1.05 11/27/2015 03:07 PM   GFR 85.71 11/26/2014 09:11 AM    GFRNONAA >60 08/12/2022 07:47 AM   GFRAA >60 08/10/2019 12:24 PM       Latest Ref Rng & Units 08/12/2022    7:47 AM 07/29/2022    9:42 AM 05/30/2022   12:52 PM  BMP  Glucose 70 - 99 mg/dL 161  096  045   BUN 8 - 23 mg/dL 18  19  21    Creatinine 0.61 - 1.24 mg/dL 4.09  8.11  9.14   Sodium 135 - 145 mmol/L 138  139  142   Potassium 3.5 - 5.1 mmol/L 3.7  3.8  4.5   Chloride 98 - 111 mmol/L 104  104  104   CO2 22 - 32  mmol/L 26  27  27    Calcium 8.9 - 10.3 mg/dL 8.9  9.5  9.4      Inetta Fermo [facility]  Clinical Pharmacist Assistant [phone]

## 2022-09-02 ENCOUNTER — Inpatient Hospital Stay: Payer: PPO

## 2022-09-02 ENCOUNTER — Inpatient Hospital Stay (HOSPITAL_BASED_OUTPATIENT_CLINIC_OR_DEPARTMENT_OTHER): Payer: PPO | Admitting: Physician Assistant

## 2022-09-02 ENCOUNTER — Other Ambulatory Visit: Payer: Self-pay

## 2022-09-02 ENCOUNTER — Inpatient Hospital Stay: Payer: PPO | Attending: Hematology and Oncology

## 2022-09-02 VITALS — BP 142/88 | HR 59 | Temp 98.1°F | Resp 13 | Wt 281.3 lb

## 2022-09-02 VITALS — BP 124/83 | HR 55 | Temp 98.0°F | Resp 18

## 2022-09-02 DIAGNOSIS — C679 Malignant neoplasm of bladder, unspecified: Secondary | ICD-10-CM

## 2022-09-02 DIAGNOSIS — Z79899 Other long term (current) drug therapy: Secondary | ICD-10-CM | POA: Insufficient documentation

## 2022-09-02 DIAGNOSIS — Z5112 Encounter for antineoplastic immunotherapy: Secondary | ICD-10-CM | POA: Diagnosis not present

## 2022-09-02 LAB — CMP (CANCER CENTER ONLY)
ALT: 18 U/L (ref 0–44)
AST: 19 U/L (ref 15–41)
Albumin: 4 g/dL (ref 3.5–5.0)
Alkaline Phosphatase: 83 U/L (ref 38–126)
Anion gap: 7 (ref 5–15)
BUN: 22 mg/dL (ref 8–23)
CO2: 27 mmol/L (ref 22–32)
Calcium: 8.5 mg/dL — ABNORMAL LOW (ref 8.9–10.3)
Chloride: 104 mmol/L (ref 98–111)
Creatinine: 1.07 mg/dL (ref 0.61–1.24)
GFR, Estimated: >60 mL/min (ref >60)
Glucose, Bld: 178 mg/dL — ABNORMAL HIGH (ref 70–99)
Potassium: 3.8 mmol/L (ref 3.5–5.1)
Sodium: 138 mmol/L (ref 135–145)
Total Bilirubin: 0.8 mg/dL (ref 0.3–1.2)
Total Protein: 7.1 g/dL (ref 6.5–8.1)

## 2022-09-02 LAB — CBC WITH DIFFERENTIAL (CANCER CENTER ONLY)
Abs Immature Granulocytes: 0.02 10*3/uL (ref 0.00–0.07)
Basophils Absolute: 0 10*3/uL (ref 0.0–0.1)
Basophils Relative: 1 %
Eosinophils Absolute: 0.3 10*3/uL (ref 0.0–0.5)
Eosinophils Relative: 5 %
HCT: 42.6 % (ref 39.0–52.0)
Hemoglobin: 13.9 g/dL (ref 13.0–17.0)
Immature Granulocytes: 0 %
Lymphocytes Relative: 21 %
Lymphs Abs: 1.3 10*3/uL (ref 0.7–4.0)
MCH: 29.8 pg (ref 26.0–34.0)
MCHC: 32.6 g/dL (ref 30.0–36.0)
MCV: 91.2 fL (ref 80.0–100.0)
Monocytes Absolute: 0.4 10*3/uL (ref 0.1–1.0)
Monocytes Relative: 7 %
Neutro Abs: 4.1 10*3/uL (ref 1.7–7.7)
Neutrophils Relative %: 66 %
Platelet Count: 137 10*3/uL — ABNORMAL LOW (ref 150–400)
RBC: 4.67 MIL/uL (ref 4.22–5.81)
RDW: 14.6 % (ref 11.5–15.5)
WBC Count: 6.2 10*3/uL (ref 4.0–10.5)
nRBC: 0 % (ref 0.0–0.2)

## 2022-09-02 MED ORDER — SODIUM CHLORIDE 0.9 % IV SOLN: Freq: Once | INTRAVENOUS | Status: AC

## 2022-09-02 MED ORDER — SODIUM CHLORIDE 0.9 % IV SOLN
200.0000 mg | Freq: Once | INTRAVENOUS | Status: AC
  Administered 2022-09-02: 200 mg via INTRAVENOUS
  Filled 2022-09-02: qty 200

## 2022-09-02 NOTE — Progress Notes (Signed)
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 [name]  08/12/2022: Cycle 1 of Pembrolizumab 09/02/2022: Cycle 2 of Pembrolizumab   CHIEF COMPLAINTS/PURPOSE OF CONSULTATION:  "High Risk Non-Muscle Invasive Bladder Cancer "  HISTORY OF PRESENTING ILLNESS:  Luis [name] 67 y.o. male with medical history significant for BCG unresponsive high risk nonmuscle invasive bladder cancer who presents for a follow up before starting Cycle 2 of Pembrolizumab therapy. He is accompanied by his wife for this visit.   On exam today on exam today Luis [name] reports he tolerated the first cycle very well. His energy and appetite are stable. He is able to complete his daily routines at baseline. He denies any nausea, vomiting or abdominal pain. Bowel habits are regular. He reports stable urinary frequency and occasional episodes of hematuria. He is scheduled for a follow up with his urology in a couple of weeks. He denies fevers, chills, sweats, shortness of breath, chest pain, cough, rash or peripheral edema. He has no other complaints. A full 10 point ROS is otherwise  negative.  MEDICAL HISTORY:  Past Medical History:  Diagnosis Date   Anxiety    takes Valium as needed   Aortic stenosis, moderate    Arthritis    Ascending aortic aneurysm (HCC)    CAD (coronary artery disease)    a. s/p PCI of the RCA 8/12 with DES by Dr Excell Seltzer, preserved EF. b. LHC/RHC (2/16) with mean RA 12, PA 32/15, mean PCWP 18, CI 3.47; patent mid and distal RCA stents, 50-60% proximal stenosis small PDA.      Cancer [facility])    bladder   Carotid stenosis    a. Carotid US (05/2013):  Bilateral 1-39% ICA; L thyroid nodule (prior hx of aspiration).   Chronic diastolic CHF (congestive heart failure) (HCC)    Complication of anesthesia    difficulty waking up after gallbladder surgery   Depression    Dyspnea    Essential hypertension    GERD (gastroesophageal reflux disease)    if needed will take OTC meds    Heart murmur    History of colonic polyps    hyperplastic   Hyperlipidemia    Joint pain    Lesion of bladder    Myocardial infarction (HCC) 2012   Obesity (BMI 30-39.9) 02/29/2016   Pre-diabetes    Restless leg    Sleep apnea    uses cpap   Tubular adenoma of colon    Vertigo    takes Meclizine as needed    SURGICAL HISTORY: Past Surgical History:  Procedure Laterality Date   AORTIC VALVE REPLACEMENT N/A 09/16/2021   Procedure: AORTIC VALVE REPLACEMENT (AVR);  Surgeon: Alleen Borne, MD;  Location: [facility] OR;  Service: Open Heart Surgery;  Laterality: N/A;   CATARACT  EXTRACTION   4 YRS AGO   BOTH EYES   CHOLECYSTECTOMY  07/21/2011   Procedure: LAPAROSCOPIC CHOLECYSTECTOMY WITH INTRAOPERATIVE CHOLANGIOGRAM;  Surgeon: Kandis Cocking, MD;  Location: [facility] ORS;  Service: General;  Laterality: N/A;   CORONARY ANGIOPLASTY  2012   2 stents   coronary stenting     s/p PCI of the RCA by Dr Excell Seltzer 8/12 with 2 promus stents   CYSTOSCOPY W/ RETROGRADES Bilateral 12/13/2019   Procedure: CYSTOSCOPY WITH RETROGRADE PYELOGRAM;  Surgeon: Sebastian Ache, MD;  Location: [facility];  Service: Urology;  Laterality: Bilateral;   CYSTOSCOPY W/ RETROGRADES Bilateral 10/14/2020   Procedure: CYSTOSCOPY WITH RETROGRADE PYELOGRAM;  Surgeon: Sebastian Ache, MD;  Location: [facility];  Service: Urology;  Laterality: Bilateral;   CYSTOSCOPY W/ RETROGRADES Bilateral 12/16/2020   Procedure: CYSTOSCOPY WITH RETROGRADE PYELOGRAM;  Surgeon: Sebastian Ache, MD;  Location: [facility];  Service: Urology;  Laterality: Bilateral;   CYSTOSCOPY W/ RETROGRADES Bilateral 06/03/2022   Procedure: CYSTOSCOPY WITH RETROGRADE PYELOGRAM;  Surgeon: Sebastian Ache, MD;  Location: [facility];  Service: Urology;  Laterality: Bilateral;   LEFT AND RIGHT HEART CATHETERIZATION WITH CORONARY ANGIOGRAM N/A 06/23/2014   Procedure: LEFT AND RIGHT HEART CATHETERIZATION WITH CORONARY ANGIOGRAM;  Surgeon: Laurey Morale, MD;  Location: [facility] CATH LAB;  Service: Cardiovascular;  Laterality: N/A;   NECK SURGERY  03/23/09   per Dr. Ophelia Charter, cervical fusion    PERICARDIOCENTESIS N/A 09/28/2021   Procedure: PERICARDIOCENTESIS;  Surgeon: Corky Crafts, MD;  Location: [facility] INVASIVE CV LAB;  Service: Cardiovascular;  Laterality: N/A;   REPLACEMENT ASCENDING AORTA N/A 09/16/2021   Procedure: REPLACEMENT ASCENDING AORTA WITH 30 X HEMASHIELD PLATINUM WOVEN DOUBLE VELOUR VASCULAR GRAFT;  Surgeon: Alleen Borne, MD;  Location: [facility] OR;  Service: Open Heart Surgery;  Laterality: N/A;  CIRC ARREST   right elbow surgery     RIGHT HEART CATH AND CORONARY ANGIOGRAPHY N/A 07/08/2021   Procedure: RIGHT HEART CATH AND CORONARY ANGIOGRAPHY;  Surgeon: Laurey Morale, MD;  Location: [facility] INVASIVE CV LAB;  Service: Cardiovascular;  Laterality: N/A;   solonscopy  05/23/08   per Dr. Celene Kras hemorrhoids only, repeat in 5 years   SUBXYPHOID PERICARDIAL WINDOW N/A 09/28/2021   Procedure: SUBXYPHOID PERICARDIAL WINDOW;  Surgeon: Alleen Borne, MD;  Location: [facility] OR;  Service: Thoracic;   Laterality: N/A;   TEE WITHOUT CARDIOVERSION N/A 06/23/2014   Procedure: TRANSESOPHAGEAL ECHOCARDIOGRAM (TEE);  Surgeon: Laurey Morale, MD;  Location: [facility] ENDOSCOPY;  Service: Cardiovascular;  Laterality: N/A;   TEE WITHOUT CARDIOVERSION N/A 01/21/2016   Procedure: TRANSESOPHAGEAL ECHOCARDIOGRAM (TEE);  Surgeon: Laurey Morale, MD;  Location: [facility] ENDOSCOPY;  Service: Cardiovascular;  Laterality: N/A;   TEE WITHOUT CARDIOVERSION N/A 09/16/2021   Procedure: TRANSESOPHAGEAL ECHOCARDIOGRAM (TEE);  Surgeon: Alleen Borne, MD;  Location: [facility] OR;  Service: Open Heart Surgery;  Laterality: N/A;   TEE WITHOUT CARDIOVERSION N/A 09/28/2021   Procedure: TRANSESOPHAGEAL ECHOCARDIOGRAM (TEE);  Surgeon: Alleen Borne, MD;  Location: [facility] OR;  Service: Thoracic;  Laterality: N/A;   TONSILLECTOMY     TRANSURETHRAL RESECTION OF BLADDER TUMOR N/A 10/21/2019   Procedure: TRANSURETHRAL RESECTION OF BLADDER TUMOR (TURBT);  Surgeon: Ihor Gully, MD;  Location: [facility];  Service: Urology;  Laterality: N/A;   TRANSURETHRAL RESECTION OF BLADDER TUMOR N/A 12/13/2019   Procedure: TRANSURETHRAL RESECTION OF BLADDER TUMOR (TURBT);  Surgeon: Sebastian Ache, MD;  Location: [facility];  Service: Urology;  Laterality: N/A;  1 HR   TRANSURETHRAL RESECTION OF BLADDER TUMOR N/A 10/14/2020   Procedure: TRANSURETHRAL RESECTION OF BLADDER TUMOR (TURBT);  Surgeon: Sebastian Ache, MD;  Location: [facility];  Service: Urology;  Laterality: N/A;   TRANSURETHRAL RESECTION OF BLADDER TUMOR N/A 12/16/2020   Procedure: RESTAGING TRANSURETHRAL RESECTION OF BLADDER TUMOR (TURBT);  Surgeon: Sebastian Ache, MD;  Location: [facility];  Service: Urology;  Laterality: N/A;   TRANSURETHRAL RESECTION OF BLADDER TUMOR N/A 06/03/2022   Procedure: TRANSURETHRAL RESECTION OF BLADDER TUMOR (TURBT);  Surgeon: Sebastian Ache, MD;  Location: [facility];  Service: Urology;  Laterality: N/A;    SOCIAL  HISTORY: Social History   Socioeconomic History   Marital status: Married    Spouse name: Not on file   Number of children: 4   Years of education: Not on file   Highest education level: Not on file  Occupational History   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  Tobacco Use   Smoking status: Former    Packs/day: 2.00    Years: 40.00    Additional pack years: 0.00    Total pack years: 80.00    Types: Cigarettes    Quit date: 2012    Years since quitting: 12.3   Smokeless tobacco: Never  Vaping Use   Vaping Use: Never used  Substance and Sexual Activity   Alcohol use: No    Alcohol/week: 0.0 standard drinks of alcohol   Drug use: No   Sexual activity: Not on file  Other Topics Concern   Not on file  Social History Narrative   Not on file   Social Determinants of Health   Financial Resource Strain: Low Risk  (08/18/2021)   Overall Financial Resource Strain (CARDIA)    Difficulty of Paying Living Expenses: Not hard at all  Food Insecurity: No Food Insecurity (08/18/2021)   Hunger Vital Sign    Worried About Running Out of Food in the Last Year: Never true    Ran Out of Food in the Last Year: Never true  Transportation Needs: No Transportation Needs (08/18/2021)   PRAPARE - Administrator, Civil Service (Medical): No    Lack of Transportation (Non-Medical): No  Physical Activity: Insufficiently Active (08/18/2021)   Exercise Vital Sign    Days of Exercise per Week: 3 days    Minutes of Exercise per Session: 30 min  Stress: No Stress Concern Present (08/18/2021)   [employer] of Occupational Health - Occupational Stress Questionnaire    Feeling of Stress : Not at all  Social Connections: Moderately Isolated (08/18/2021)   Social Connection and Isolation Panel [NHANES]    Frequency of Communication with Friends and Family: More than three times a week    Frequency of Social Gatherings with Friends and Family: More than three  times a week    Attends Religious Services: Never    Database administrator or Organizations: No    Attends Banker Meetings: Never    Marital Status: Married  Catering manager Violence: Not At Risk (08/18/2021)   Humiliation, Afraid, Rape, and Kick questionnaire    Fear of Current or Ex-Partner: No    Emotionally Abused: No    Physically Abused: No    Sexually Abused: No    FAMILY HISTORY: Family History  Problem Relation Age of Onset   Lung cancer Mother        lung   Esophageal cancer Cousin  Colon cancer Neg Hx    Rectal cancer Neg Hx    Stomach cancer Neg Hx     ALLERGIES:  has No Known Allergies.  MEDICATIONS:  Current Outpatient Medications  Medication Sig Dispense Refill   acetaminophen (TYLENOL) 500 MG tablet Take 1-2 tablets (500-1,000 mg total) by mouth every 6 (six) hours as needed for moderate pain. (Patient taking differently: Take 1,000 mg by mouth every 6 (six) hours as needed for moderate pain.) 30 tablet 0   aspirin 81 MG tablet Take 81 mg by mouth every morning.     carvedilol (COREG) 3.125 MG tablet TAKE 1 TABLET BY MOUTH TWICE A DAY WITH A MEAL (Patient taking differently: Take 3.125 mg by mouth 2 (two) times daily with a meal.) 180 tablet 1   diazepam (VALIUM) 5 MG tablet Take 1 tablet (5 mg total) by mouth every 12 (twelve) hours as needed for anxiety. 60 tablet 2   digoxin (LANOXIN) 0.25 MG tablet Take 1 tablet by mouth daily.     empagliflozin (JARDIANCE) 10 MG TABS tablet Take 1 tablet (10 mg total) by mouth daily before breakfast. 30 tablet 11   Ferrous Sulfate (IRON PO) Take 1 tablet by mouth daily. 1 tab daily     furosemide (LASIX) 20 MG tablet Take 1 tablet (20 mg total) by mouth as directed. NEEDS FOLLOW UP APPOINTMENT FOR MORE REFILLS 450 tablet 0   ibuprofen (ADVIL) 200 MG tablet Take by mouth.     ipratropium (ATROVENT) 0.03 % nasal spray Place 2 sprays into both nostrils every 12 (twelve) hours as needed for rhinitis.      KLOR-CON M20 20 MEQ tablet TAKE 2 TABLETS BY MOUTH EVERY DAY 180 tablet 1   Magnesium 500 MG TABS Take 500 mg by mouth daily.     meclizine (ANTIVERT) 25 MG tablet Take 1 tablet (25 mg total) by mouth 3 (three) times daily as needed for dizziness. 30 tablet 0   multivitamin-iron-minerals-folic acid (CENTRUM) chewable tablet Chew 1 tablet by mouth daily.     nitroGLYCERIN (NITROSTAT) 0.4 MG SL tablet Place under the tongue.     phenazopyridine (PYRIDIUM) 200 MG tablet Take 200 mg by mouth every 8 (eight) hours as needed. AZO     rosuvastatin (CRESTOR) 20 MG tablet TAKE 1 TABLET BY MOUTH EVERYDAY AT BEDTIME (Patient taking differently: Take 20 mg by mouth at bedtime.) 90 tablet 3   tamsulosin (FLOMAX) 0.4 MG CAPS capsule Take 0.4 mg by mouth daily.     traMADol (ULTRAM) 50 MG tablet Take 1 tablet (50 mg total) by mouth every 6 (six) hours as needed for moderate pain. 14 tablet 0   venlafaxine XR (EFFEXOR-XR) 150 MG 24 hr capsule TAKE 1 CAPSULE BY MOUTH DAILY WITH BREAKFAST. 90 capsule 0   No current facility-administered medications for this visit.    REVIEW OF SYSTEMS:   Constitutional: ( - ) fevers, ( - )  chills , ( - ) night sweats Eyes: ( - ) blurriness of vision, ( - ) double vision, ( - ) watery eyes Ears, nose, mouth, throat, and face: ( - ) mucositis, ( - ) sore throat Respiratory: ( - ) cough, ( - ) dyspnea, ( - ) wheezes Cardiovascular: ( - ) palpitation, ( - ) chest discomfort, ( - ) lower extremity swelling Gastrointestinal:  ( - ) nausea, ( - ) heartburn, ( - ) change in bowel habits Skin: ( - ) abnormal skin rashes Lymphatics: ( - ) new  lymphadenopathy, ( - ) easy bruising Neurological: ( - ) numbness, ( - ) tingling, ( - ) new weaknesses Behavioral/Psych: ( - ) mood change, ( - ) new changes  All other systems were reviewed with the patient and are negative.  PHYSICAL EXAMINATION: ECOG PERFORMANCE STATUS: 1 - Symptomatic but completely ambulatory  Vitals:   09/02/22 0854   BP: (!) 142/88  Pulse: (!) 59  Resp: 13  Temp: 98.1 F (36.7 C)  SpO2: 95%   Filed Weights   09/02/22 0854  Weight: 281 lb 4.8 oz (127.6 kg)    GENERAL: well appearing elderly Caucasian male in NAD  SKIN: skin color, texture, turgor are normal, no rashes or significant lesions EYES: conjunctiva are pink and non-injected, sclera clear LUNGS: clear to auscultation and percussion with normal breathing effort HEART: regular rate & rhythm and no murmurs and no lower extremity edema Musculoskeletal: no cyanosis of digits and no clubbing  PSYCH: alert & oriented x 3, fluent speech NEURO: no focal motor/sensory deficits  LABORATORY DATA:  I have reviewed the data as listed    Latest Ref Rng & Units 09/02/2022    8:13 AM 08/12/2022    7:47 AM 07/29/2022    9:42 AM  CBC  WBC 4.0 - 10.5 K/uL 6.2  6.8  6.8   Hemoglobin 13.0 - 17.0 g/dL 16.1  09.6  04.5   Hematocrit 39.0 - 52.0 % 42.6  42.1  43.7   Platelets 150 - 400 K/uL 137  169  156        Latest Ref Rng & Units 09/02/2022    8:13 AM 08/12/2022    7:47 AM 07/29/2022    9:42 AM  CMP  Glucose 70 - 99 mg/dL 409  811  914   BUN 8 - 23 mg/dL 22  18  19    Creatinine 0.61 - 1.24 mg/dL 7.82  9.56  2.13   Sodium 135 - 145 mmol/L 138  138  139   Potassium 3.5 - 5.1 mmol/L 3.8  3.7  3.8   Chloride 98 - 111 mmol/L 104  104  104   CO2 22 - 32 mmol/L 27  26  27    Calcium 8.9 - 10.3 mg/dL 8.5  8.9  9.5   Total Protein 6.5 - 8.1 g/dL 7.1  7.3  7.4   Total Bilirubin 0.3 - 1.2 mg/dL 0.8  0.7  0.9   Alkaline Phos 38 - 126 U/L 83  89  89   AST 15 - 41 U/L 19  22  22    ALT 0 - 44 U/L 18  24  27      RADIOGRAPHIC STUDIES: No results found.  ASSESSMENT & PLAN Luis [name] 67 y.o. 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  After review of the labs, review of the records, and discussion with the patient the patients findings are most consistent with  BCG unresponsive high risk non-muscle invasive bladder cancer, not a candidate for cystectomy.  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 21 days until progression or intolerance up to 24 months. --Started Cycle 1 of Pembrolizumab on 08/12/2022.  PLAN: --Due to start Cycle 2 of Pembrolizumab therapy today --Labs from today were reviewed and adequate for treatment. WBC 6.2, Hgb 13.9, Plt 137K. Creatinine and LFTs normal.  --Proceed with treatment  today without any modifications --RTC in 3 weeks with labs and follow up visit before Cycle 3.  #Urinary frequency and hematuria: -- Defer to urology for further evaluation and management --Scheduled for follow up on 09/13/2022.  #Supportive Care -- chemotherapy education to be scheduled  -- port placement not required  -- no pain medication required at this time.     No orders of the defined types were placed in this encounter.   All questions were answered. The patient knows to call the clinic with any problems, questions or concerns.  I have spent a total of 30 minutes minutes of face-to-face and non-face-to-face time, preparing to see the patient, performing a medically appropriate examination, counseling and educating the patient, documenting clinical information in the electronic health record,and care coordination.   [name] PA-C Dept of Hematology and Oncology [facility] Phone: [phone]   09/02/2022 9:00 AM  Literature Support:  Pembrolizumab monotherapy for the treatment of high-risk non-muscle-invasive bladder cancer unresponsive to BCG [facility]): an open-label, single-arm, multicentre, phase 2 study,The Lancet Oncology,Volume 22, Issue 7,2021,Pages 919-930,ISSN 0981-1914  -- Pembrolizumab monotherapy was tolerable and showed promising antitumour activity in  patients with BCG-unresponsive non-muscle-invasive bladder cancer who declined or were ineligible for radical cystectomy and should be considered a a clinically active non-surgical treatment option in this difficult-to-treat population.  --All enrolled patients were assigned to receive pembrolizumab 200 mg intravenously every 3 weeks for up to 24 months or until centrally confirmed disease persistence, recurrence, or progression; unacceptable toxic effects  --

## 2022-09-02 NOTE — Patient Instructions (Signed)
[facility]  Discharge Instructions: Thank you for choosing [facility] to provide your oncology and hematology care.   If you have a lab appointment with the Cancer Center, please go directly to the Cancer Center and check in at the registration area.   Wear comfortable clothing and clothing appropriate for easy access to any Portacath or PICC line.   We strive to give you quality time with your provider. You may need to reschedule your appointment if you arrive late (15 or more minutes).  Arriving late affects you and other patients whose appointments are after yours.  Also, if you miss three or more appointments without notifying the office, you may be dismissed from the clinic at the provider's discretion.      For prescription refill requests, have your pharmacy contact our office and allow 72 hours for refills to be completed.    Today you received the following chemotherapy and/or immunotherapy agent: Pembrolizumab (Keytruda)   To help prevent nausea and vomiting after your treatment, we encourage you to take your nausea medication as directed.  BELOW ARE SYMPTOMS THAT SHOULD BE REPORTED IMMEDIATELY: *FEVER GREATER THAN 100.4 F (38 C) OR HIGHER *CHILLS OR SWEATING *NAUSEA AND VOMITING THAT IS NOT CONTROLLED WITH YOUR NAUSEA MEDICATION *UNUSUAL SHORTNESS OF BREATH *UNUSUAL BRUISING OR BLEEDING *URINARY PROBLEMS (pain or burning when urinating, or frequent urination) *BOWEL PROBLEMS (unusual diarrhea, constipation, pain near the anus) TENDERNESS IN MOUTH AND THROAT WITH OR WITHOUT PRESENCE OF ULCERS (sore throat, sores in mouth, or a toothache) UNUSUAL RASH, SWELLING OR PAIN  UNUSUAL VAGINAL DISCHARGE OR ITCHING   Items with * indicate a potential emergency and should be followed up as soon as possible or go to the Emergency Department if any problems should occur.  Please show the CHEMOTHERAPY ALERT CARD or IMMUNOTHERAPY ALERT  CARD at check-in to the Emergency Department and triage nurse.  Should you have questions after your visit or need to cancel or reschedule your appointment, please contact [facility]  Dept: [phone]  and follow the prompts.  Office hours are 8:00 a.m. to 4:30 p.m. Monday - Friday. Please note that voicemails left after 4:00 p.m. may not be returned until the following business day.  We are closed weekends and major holidays. You have access to a nurse at all times for urgent questions. Please call the main number to the clinic Dept: [phone] and follow the prompts.   For any non-urgent questions, you may also contact your provider using MyChart. We now offer e-Visits for anyone 68 and older to request care online for non-urgent symptoms. For details visit [email or web address].   Also download the MyChart app! Go to the app store, search "MyChart", open the app, select [place], and log in with your MyChart username and password.

## 2022-09-05 ENCOUNTER — Telehealth: Payer: Self-pay | Admitting: Hematology and Oncology

## 2022-09-06 ENCOUNTER — Other Ambulatory Visit: Payer: Self-pay

## 2022-09-07 ENCOUNTER — Other Ambulatory Visit: Payer: Self-pay

## 2022-09-07 DIAGNOSIS — H35033 Hypertensive retinopathy, bilateral: Secondary | ICD-10-CM | POA: Diagnosis not present

## 2022-09-07 DIAGNOSIS — D3131 Benign neoplasm of right choroid: Secondary | ICD-10-CM | POA: Diagnosis not present

## 2022-09-07 DIAGNOSIS — Z961 Presence of intraocular lens: Secondary | ICD-10-CM | POA: Diagnosis not present

## 2022-09-07 DIAGNOSIS — H43812 Vitreous degeneration, left eye: Secondary | ICD-10-CM | POA: Diagnosis not present

## 2022-09-08 ENCOUNTER — Encounter: Payer: Self-pay | Admitting: Family Medicine

## 2022-09-09 ENCOUNTER — Other Ambulatory Visit: Payer: Self-pay

## 2022-09-09 MED ORDER — MECLIZINE HCL 25 MG PO TABS: 25.0000 mg | ORAL_TABLET | Freq: Three times a day (TID) | ORAL | 0 refills | Status: AC | PRN

## 2022-09-11 IMAGING — CT CT HEAD W/O CM
4 series · 17 of 47 positions shown, 19 images · non-contrast
Comparison: None Available.

CLINICAL DATA: Head trauma



[Series 3: head bone · axial · 0.44mm/px · z∈[-134,-70]mm · 4 of 90 slices shown]
[im 9/90  bone]
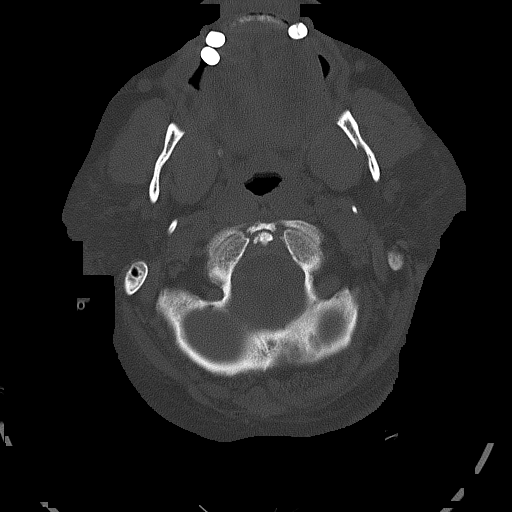
[im 18/90  bone]
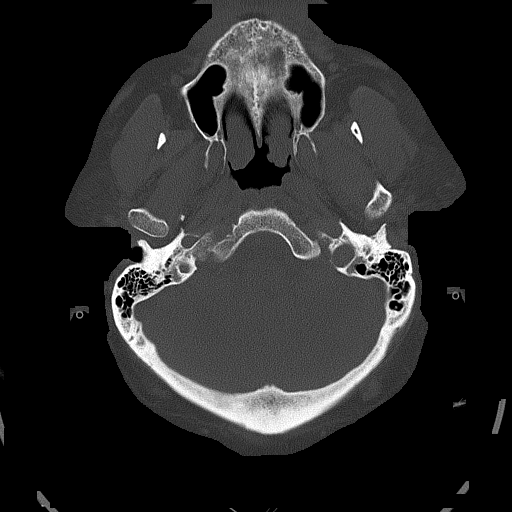
[im 27/90  bone]
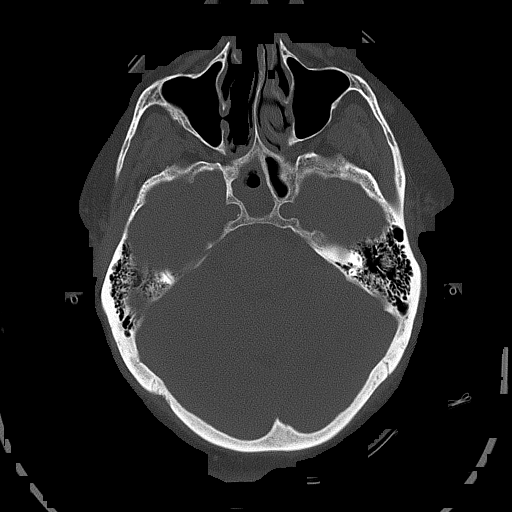
[im 41/90  bone]
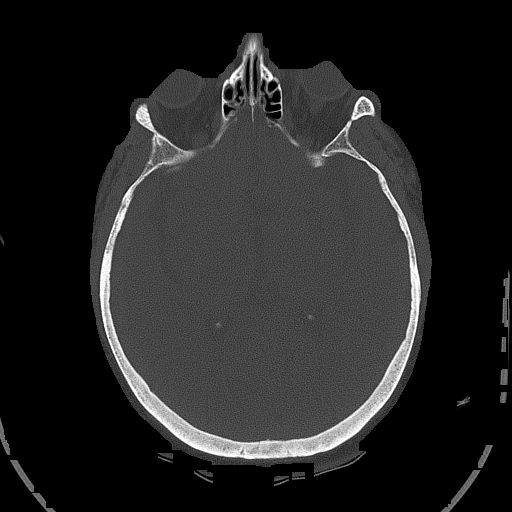

[Series 4: head without · axial · non-contrast · 0.44mm/px · z∈[-130,+0]mm · 7 of 36 slices shown, 9 images]
[im 5/36  brain]
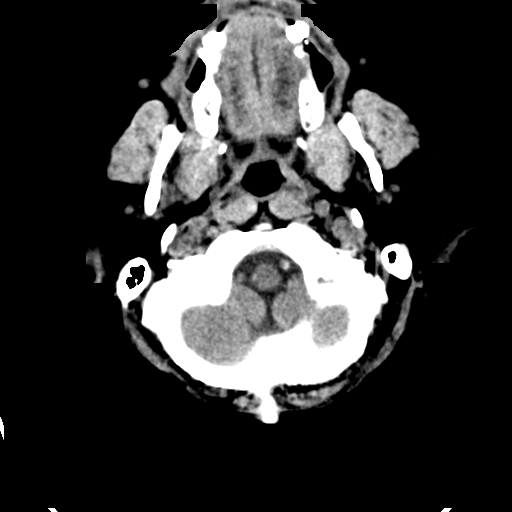
[im 5/36  bone]
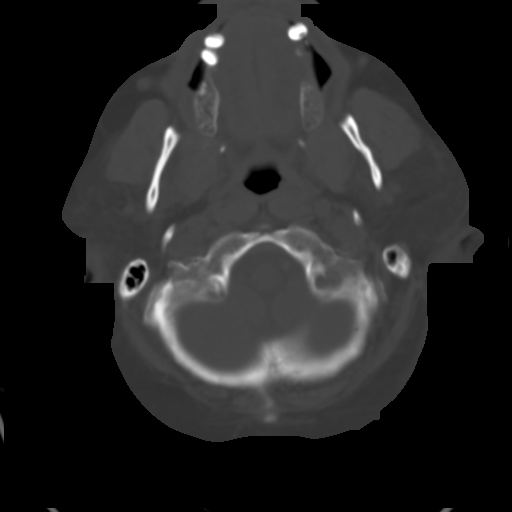
[im 9/36  brain]
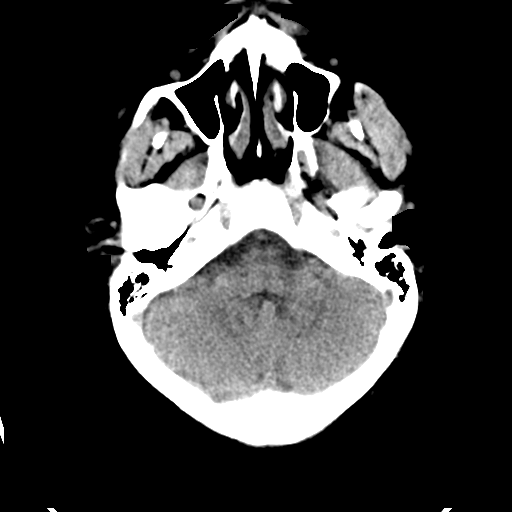
[im 14/36  brain]
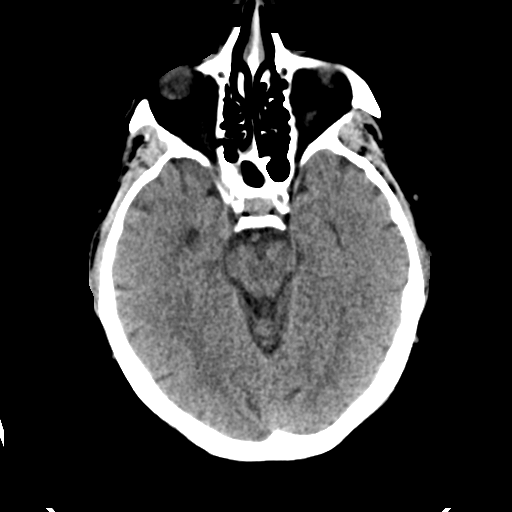
[im 18/36  brain]
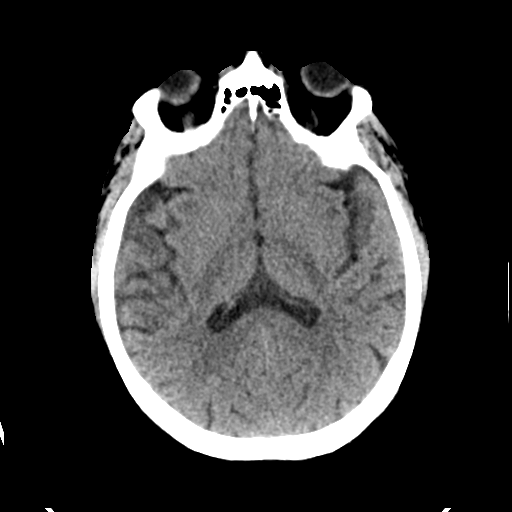
[im 22/36  brain]
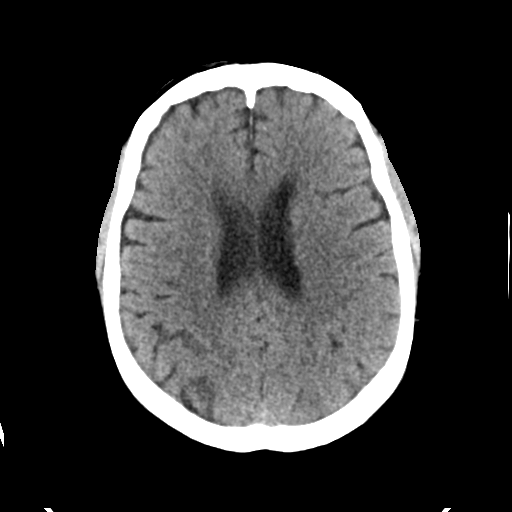
[im 22/36  bone]
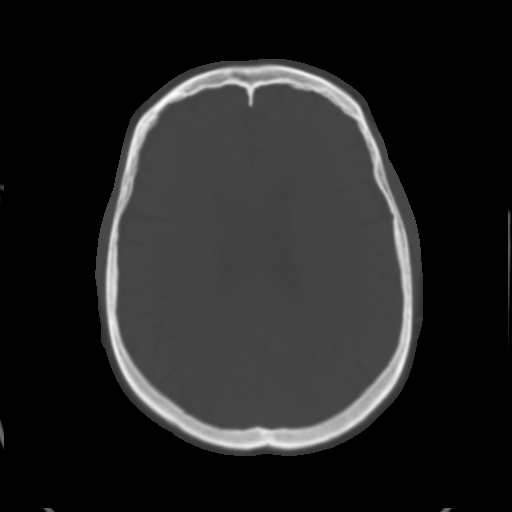
[im 27/36  brain]
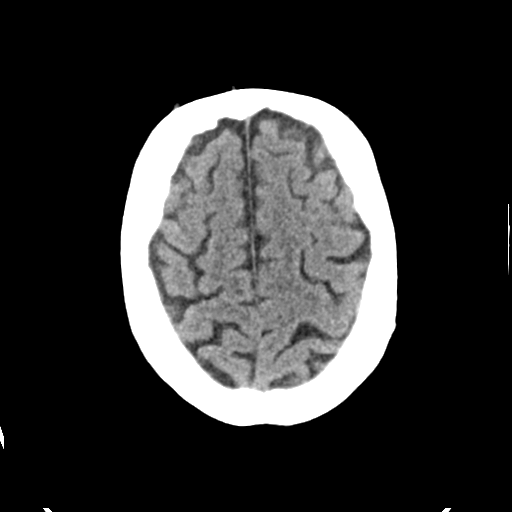
[im 31/36  brain]
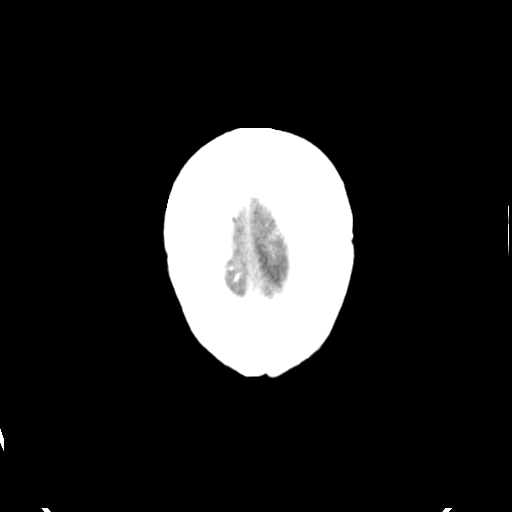

[Series 5: head without cor · coronal · non-contrast · 0.35mm/px · 3 of 67 slices shown]
[im 23/67  brain]
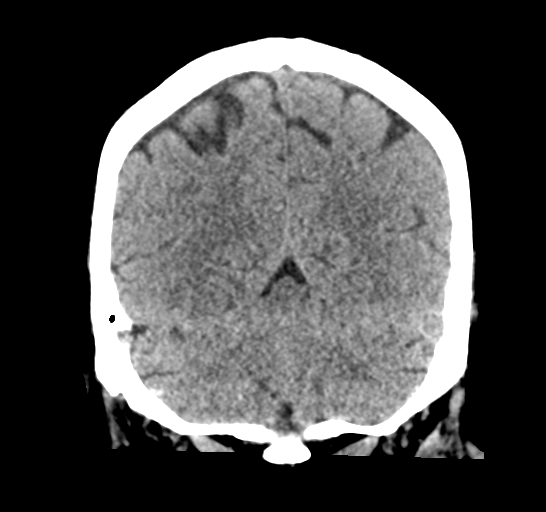
[im 30/67  brain]
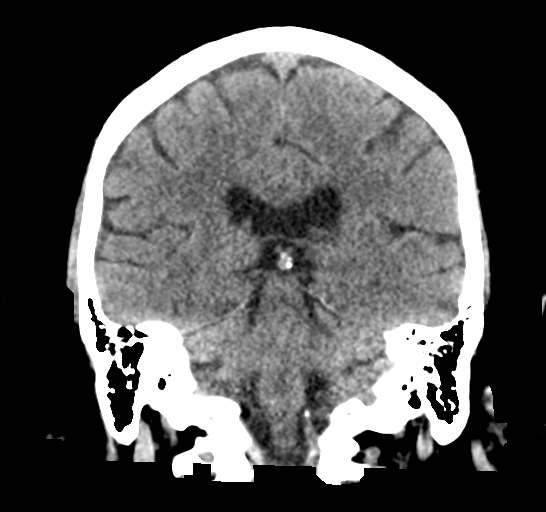
[im 37/67  brain]
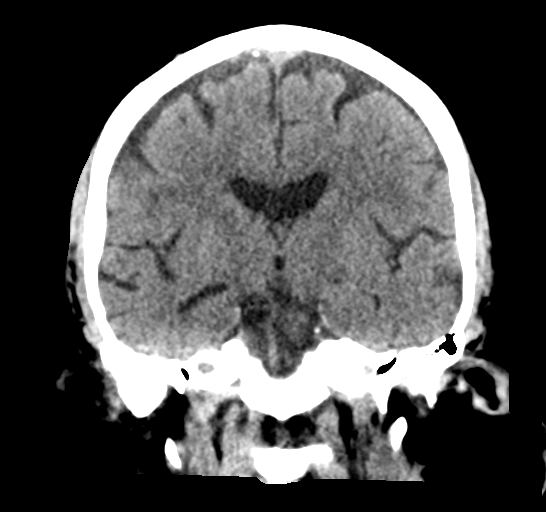

[Series 6: head without sag · sagittal · non-contrast · 0.35mm/px · 3 of 61 slices shown]
[im 21/61  brain]
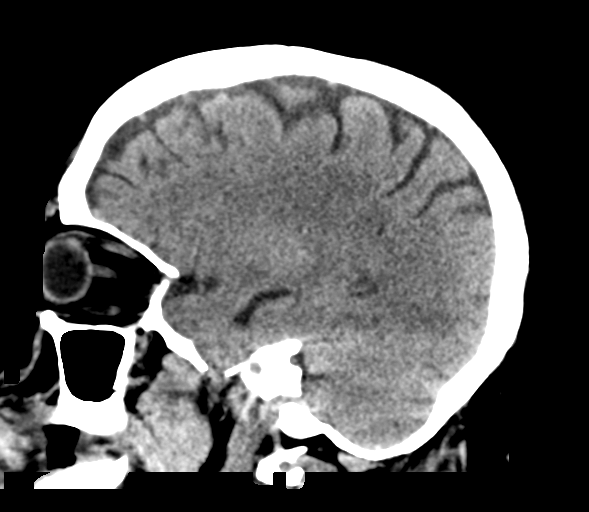
[im 31/61  brain]
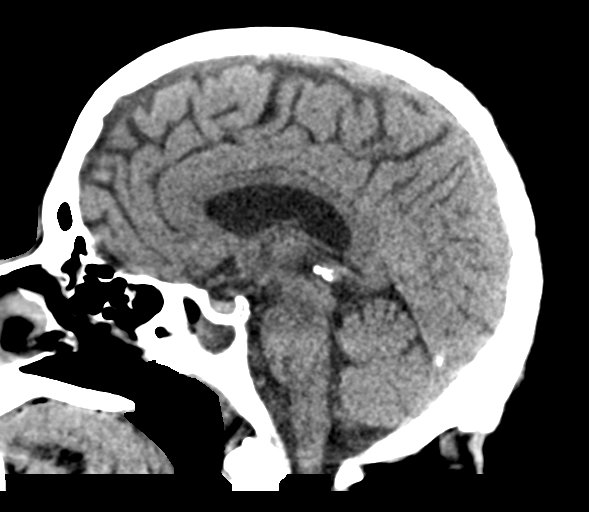
[im 41/61  brain]
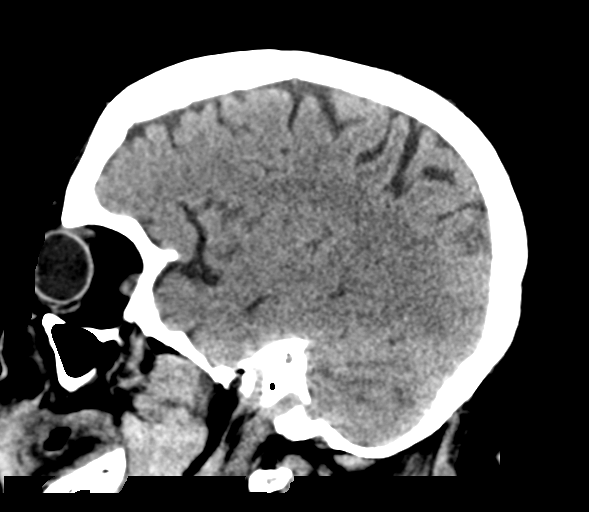

[17 of 47 positions shown; findings below may reference images not displayed]

FINDINGS: Brain: There is no mass, hemorrhage or extra-axial collection. The
size and configuration of the ventricles and extra-axial CSF spaces
are normal. The brain parenchyma is normal, without acute or chronic
infarction.

Vascular: No abnormal hyperdensity of the major intracranial
arteries or dural venous sinuses. No intracranial atherosclerosis.

Skull: The visualized skull base, calvarium and extracranial soft
tissues are normal.

Sinuses/Orbits: No fluid levels or advanced mucosal thickening of
the visualized paranasal sinuses. No mastoid or middle ear effusion.
The orbits are normal.
IMPRESSION: Normal head CT.

## 2022-09-12 IMAGING — DX DG CHEST 1V PORT
1 series · 1 of 1 positions shown · non-contrast
Comparison: September 28, 2021

CLINICAL DATA: Shortness of breath and chest pain.

EXAM:
PORTABLE CHEST 1 VIEW

[chest ap]
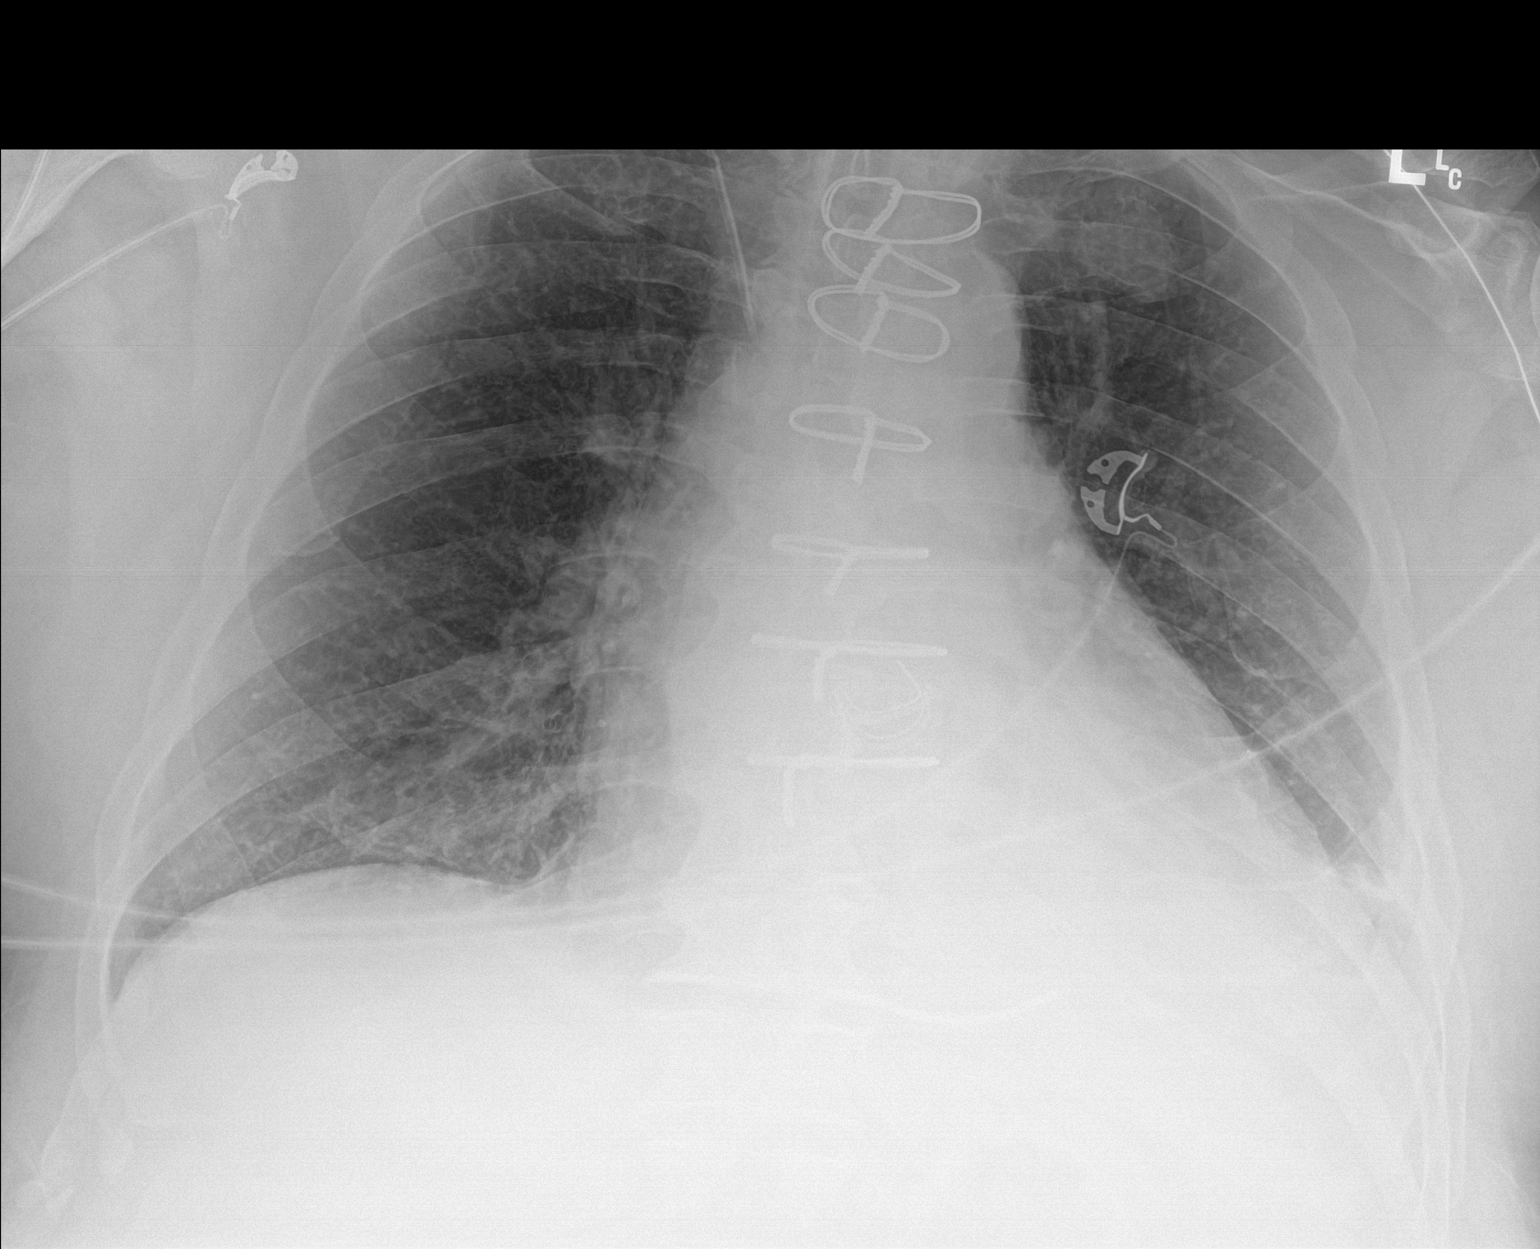

[1 of 1 positions shown; findings below may reference images not displayed]

FINDINGS: The heart size and mediastinal contours are stable. Cardiac valvular
replacement ring is unchanged. Heart size is enlarged. Right central
venous line is unchanged compared prior exam. Patchy consolidation
of left lung base is noted. The visualized skeletal structures are
stable.
IMPRESSION: Patchy consolidation of left lung base, suspicious for pneumonia.

## 2022-09-13 DIAGNOSIS — R338 Other retention of urine: Secondary | ICD-10-CM | POA: Diagnosis not present

## 2022-09-13 DIAGNOSIS — C678 Malignant neoplasm of overlapping sites of bladder: Secondary | ICD-10-CM | POA: Diagnosis not present

## 2022-09-23 ENCOUNTER — Ambulatory Visit: Payer: PPO | Admitting: Physician Assistant

## 2022-09-23 ENCOUNTER — Ambulatory Visit: Payer: PPO

## 2022-09-23 ENCOUNTER — Inpatient Hospital Stay: Payer: PPO

## 2022-09-23 ENCOUNTER — Inpatient Hospital Stay (HOSPITAL_BASED_OUTPATIENT_CLINIC_OR_DEPARTMENT_OTHER): Payer: PPO | Admitting: Hematology and Oncology

## 2022-09-23 ENCOUNTER — Other Ambulatory Visit: Payer: PPO

## 2022-09-23 VITALS — BP 124/67 | HR 56 | Temp 97.9°F | Resp 18

## 2022-09-23 VITALS — BP 133/63 | HR 60 | Temp 98.1°F | Wt 281.8 lb

## 2022-09-23 DIAGNOSIS — Z5112 Encounter for antineoplastic immunotherapy: Secondary | ICD-10-CM

## 2022-09-23 DIAGNOSIS — C679 Malignant neoplasm of bladder, unspecified: Secondary | ICD-10-CM

## 2022-09-23 LAB — CBC WITH DIFFERENTIAL (CANCER CENTER ONLY)
Abs Immature Granulocytes: 0.02 10*3/uL (ref 0.00–0.07)
Basophils Absolute: 0 10*3/uL (ref 0.0–0.1)
Basophils Relative: 1 %
Eosinophils Absolute: 0.3 10*3/uL (ref 0.0–0.5)
Eosinophils Relative: 5 %
HCT: 42.4 % (ref 39.0–52.0)
Hemoglobin: 14.1 g/dL (ref 13.0–17.0)
Immature Granulocytes: 0 %
Lymphocytes Relative: 26 %
Lymphs Abs: 1.7 10*3/uL (ref 0.7–4.0)
MCH: 29.7 pg (ref 26.0–34.0)
MCHC: 33.3 g/dL (ref 30.0–36.0)
MCV: 89.5 fL (ref 80.0–100.0)
Monocytes Absolute: 0.6 10*3/uL (ref 0.1–1.0)
Monocytes Relative: 9 %
Neutro Abs: 3.8 10*3/uL (ref 1.7–7.7)
Neutrophils Relative %: 59 %
Platelet Count: 151 10*3/uL (ref 150–400)
RBC: 4.74 MIL/uL (ref 4.22–5.81)
RDW: 14.6 % (ref 11.5–15.5)
WBC Count: 6.4 10*3/uL (ref 4.0–10.5)
nRBC: 0 % (ref 0.0–0.2)

## 2022-09-23 LAB — CMP (CANCER CENTER ONLY)
ALT: 25 U/L (ref 0–44)
AST: 24 U/L (ref 15–41)
Albumin: 4.2 g/dL (ref 3.5–5.0)
Alkaline Phosphatase: 85 U/L (ref 38–126)
Anion gap: 8 (ref 5–15)
BUN: 22 mg/dL (ref 8–23)
CO2: 28 mmol/L (ref 22–32)
Calcium: 8.8 mg/dL — ABNORMAL LOW (ref 8.9–10.3)
Chloride: 104 mmol/L (ref 98–111)
Creatinine: 1.24 mg/dL (ref 0.61–1.24)
GFR, Estimated: >60 mL/min (ref >60)
Glucose, Bld: 139 mg/dL — ABNORMAL HIGH (ref 70–99)
Potassium: 4 mmol/L (ref 3.5–5.1)
Sodium: 140 mmol/L (ref 135–145)
Total Bilirubin: 0.7 mg/dL (ref 0.3–1.2)
Total Protein: 7.2 g/dL (ref 6.5–8.1)

## 2022-09-23 LAB — TSH: TSH: 1.38 u[IU]/mL (ref 0.350–4.500)

## 2022-09-23 MED ORDER — SODIUM CHLORIDE 0.9 % IV SOLN: Freq: Once | INTRAVENOUS | Status: AC

## 2022-09-23 MED ORDER — SODIUM CHLORIDE 0.9 % IV SOLN
200.0000 mg | Freq: Once | INTRAVENOUS | Status: AC
  Administered 2022-09-23: 200 mg via INTRAVENOUS
  Filled 2022-09-23: qty 200

## 2022-09-23 NOTE — Progress Notes (Signed)
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 [name]  08/12/2022: Cycle 1 of Pembrolizumab 09/02/2022: Cycle 2 of Pembrolizumab  09/23/2022: Cycle 3 of Pembrolizumab  CHIEF COMPLAINTS/PURPOSE OF CONSULTATION:  "High Risk Non-Muscle Invasive Bladder Cancer "  HISTORY OF PRESENTING ILLNESS:  Luis [name] 67 y.o. male with medical history significant for BCG unresponsive high risk nonmuscle invasive bladder cancer who presents for a follow up before starting Cycle 3 of Pembrolizumab therapy. He is accompanied by his wife for this visit.   On exam today on exam today Luis [name] reports reports that he is not having any side effects as a result of his pembrolizumab therapy.  He reports that he does have a little drop in energy the day he receives infusion but otherwise has been well.  He had no issues with rash, itching, or shortness of breath.  He reports his appetite has been good though he does continue to have some blood in his urine.  He reports that he did see urology a few weeks ago and there was "another spot".  He reports that they will be doing a repeat  cystoscopy in about 3 months time.  He has a little bit of pain with urination but nothing else out of the ordinary.  He is not having any nausea, vomiting, or diarrhea.. He denies fevers, chills, sweats, shortness of breath, chest pain, cough, rash or peripheral edema. He has no other complaints. A full 10 point ROS is otherwise negative.  MEDICAL HISTORY:  Past Medical History:  Diagnosis Date   Anxiety    takes Valium as needed   Aortic stenosis, moderate    Arthritis    Ascending aortic aneurysm (HCC)    CAD (coronary artery disease)    a. s/p PCI of the RCA 8/12 with DES by Dr Excell Seltzer, preserved EF. b. LHC/RHC (2/16) with mean RA 12, PA 32/15, mean PCWP 18, CI 3.47; patent mid and distal RCA stents, 50-60% proximal stenosis small PDA.      Cancer [facility])    bladder   Carotid stenosis    a. Carotid US (05/2013):  Bilateral 1-39% ICA; L thyroid nodule (prior hx of aspiration).   Chronic diastolic CHF (congestive heart failure) (HCC)    Complication of anesthesia    difficulty waking up after gallbladder surgery   Depression    Dyspnea    Essential hypertension    GERD (gastroesophageal reflux disease)    if needed will take OTC meds    Heart murmur    History of colonic polyps    hyperplastic   Hyperlipidemia    Joint pain    Lesion of bladder    Myocardial infarction (HCC) 2012   Obesity (BMI 30-39.9) 02/29/2016   Pre-diabetes    Restless leg  Sleep apnea    uses cpap   Tubular adenoma of colon    Vertigo    takes Meclizine as needed    SURGICAL HISTORY: Past Surgical History:  Procedure Laterality Date   AORTIC VALVE REPLACEMENT N/A 09/16/2021   Procedure: AORTIC VALVE REPLACEMENT (AVR);  Surgeon: Alleen Borne, MD;  Location: [facility] OR;  Service: Open Heart Surgery;  Laterality: N/A;   CATARACT EXTRACTION   4 YRS AGO   BOTH EYES   CHOLECYSTECTOMY  07/21/2011   Procedure: LAPAROSCOPIC CHOLECYSTECTOMY WITH INTRAOPERATIVE CHOLANGIOGRAM;  Surgeon: Kandis Cocking, MD;   Location: [facility] ORS;  Service: General;  Laterality: N/A;   CORONARY ANGIOPLASTY  2012   2 stents   coronary stenting     s/p PCI of the RCA by Dr Excell Seltzer 8/12 with 2 promus stents   CYSTOSCOPY W/ RETROGRADES Bilateral 12/13/2019   Procedure: CYSTOSCOPY WITH RETROGRADE PYELOGRAM;  Surgeon: Sebastian Ache, MD;  [facility];  Service: Urology;  Laterality: Bilateral;   CYSTOSCOPY W/ RETROGRADES Bilateral 10/14/2020   Procedure: CYSTOSCOPY WITH RETROGRADE PYELOGRAM;  Surgeon: Sebastian Ache, MD;  [facility];  Service: Urology;  Laterality: Bilateral;   CYSTOSCOPY W/ RETROGRADES Bilateral 12/16/2020   Procedure: CYSTOSCOPY WITH RETROGRADE PYELOGRAM;  Surgeon: Sebastian Ache, MD;  Location: [facility];  Service: Urology;  Laterality: Bilateral;   CYSTOSCOPY W/ RETROGRADES Bilateral 06/03/2022   Procedure: CYSTOSCOPY WITH RETROGRADE PYELOGRAM;  Surgeon: Sebastian Ache, MD;  Location: [facility];  Service: Urology;  Laterality: Bilateral;   LEFT AND RIGHT HEART CATHETERIZATION WITH CORONARY ANGIOGRAM N/A 06/23/2014   Procedure: LEFT AND RIGHT HEART CATHETERIZATION WITH CORONARY ANGIOGRAM;  Surgeon: Laurey Morale, MD;  Location: [facility] CATH LAB;  Service: Cardiovascular;  Laterality: N/A;   NECK SURGERY  03/23/09   per Dr. Ophelia Charter, cervical fusion    PERICARDIOCENTESIS N/A 09/28/2021   Procedure: PERICARDIOCENTESIS;  Surgeon: Corky Crafts, MD;  Location: [facility] INVASIVE CV LAB;  Service: Cardiovascular;  Laterality: N/A;   REPLACEMENT ASCENDING AORTA N/A 09/16/2021   Procedure: REPLACEMENT ASCENDING AORTA WITH 30 X HEMASHIELD PLATINUM WOVEN DOUBLE VELOUR VASCULAR GRAFT;  Surgeon: Alleen Borne, MD;  Location: [facility] OR;  Service: Open Heart Surgery;  Laterality: N/A;  CIRC ARREST   right elbow surgery     RIGHT HEART CATH AND CORONARY ANGIOGRAPHY N/A 07/08/2021   Procedure: RIGHT HEART CATH AND CORONARY ANGIOGRAPHY;  Surgeon: Laurey Morale,  MD;  Location: [facility] INVASIVE CV LAB;  Service: Cardiovascular;  Laterality: N/A;   solonscopy  05/23/08   per Dr. Celene Kras hemorrhoids only, repeat in 5 years   SUBXYPHOID PERICARDIAL WINDOW N/A 09/28/2021   Procedure: SUBXYPHOID PERICARDIAL WINDOW;  Surgeon: Alleen Borne, MD;  Location: [facility] OR;  Service: Thoracic;  Laterality: N/A;   TEE WITHOUT CARDIOVERSION N/A 06/23/2014   Procedure: TRANSESOPHAGEAL ECHOCARDIOGRAM (TEE);  Surgeon: Laurey Morale, MD;  Location: [facility] ENDOSCOPY;  Service: Cardiovascular;  Laterality: N/A;   TEE WITHOUT CARDIOVERSION N/A 01/21/2016   Procedure: TRANSESOPHAGEAL ECHOCARDIOGRAM (TEE);  Surgeon: Laurey Morale, MD;  Location: [facility] ENDOSCOPY;  Service: Cardiovascular;  Laterality: N/A;   TEE WITHOUT CARDIOVERSION N/A 09/16/2021   Procedure: TRANSESOPHAGEAL ECHOCARDIOGRAM (TEE);  Surgeon: Alleen Borne, MD;  Location: [facility] OR;  Service: Open Heart Surgery;  Laterality: N/A;   TEE WITHOUT CARDIOVERSION N/A 09/28/2021   Procedure: TRANSESOPHAGEAL ECHOCARDIOGRAM (TEE);  Surgeon: Alleen Borne, MD;  Location: [facility] OR;  Service: Thoracic;  Laterality: N/A;  TONSILLECTOMY     TRANSURETHRAL RESECTION OF BLADDER TUMOR N/A 10/21/2019   Procedure: TRANSURETHRAL RESECTION OF BLADDER TUMOR (TURBT);  Surgeon: Ihor Gully, MD;  Location: [facility]  Service: Urology;  Laterality: N/A;   TRANSURETHRAL RESECTION OF BLADDER TUMOR N/A 12/13/2019   Procedure: TRANSURETHRAL RESECTION OF BLADDER TUMOR (TURBT);  Surgeon: Sebastian Ache, MD;  Location: [facility];  Service: Urology;  Laterality: N/A;  1 HR   TRANSURETHRAL RESECTION OF BLADDER TUMOR N/A 10/14/2020   Procedure: TRANSURETHRAL RESECTION OF BLADDER TUMOR (TURBT);  Surgeon: Sebastian Ache, MD;  [facility];  Service: Urology;  Laterality: N/A;   TRANSURETHRAL RESECTION OF BLADDER TUMOR N/A 12/16/2020   Procedure: RESTAGING TRANSURETHRAL RESECTION OF BLADDER TUMOR  (TURBT);  Surgeon: Sebastian Ache, MD;  Location: [facility];  Service: Urology;  Laterality: N/A;   TRANSURETHRAL RESECTION OF BLADDER TUMOR N/A 06/03/2022   Procedure: TRANSURETHRAL RESECTION OF BLADDER TUMOR (TURBT);  Surgeon: Sebastian Ache, MD;  Location: [facility]S;  Service: Urology;  Laterality: N/A;    SOCIAL HISTORY: Social History   Socioeconomic History   Marital status: Married    Spouse name: Not on file   Number of children: 4   Years of education: Not on file   Highest education level: Not on file  Occupational History   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  Tobacco Use   Smoking status: Former    Packs/day: 2.00    Years: 40.00    Additional pack years: 0.00    Total pack years: 80.00    Types: Cigarettes    Quit date: 2012    Years since quitting: 12.4   Smokeless tobacco: Never  Vaping Use   Vaping Use: Never used  Substance and Sexual Activity   Alcohol use: No    Alcohol/week: 0.0 standard drinks of alcohol   Drug use: No   Sexual activity: Not on file  Other Topics Concern   Not on file  Social History Narrative   Not on file   Social Determinants of Health   Financial Resource Strain: Low Risk  (08/18/2021)   Overall Financial Resource Strain (CARDIA)    Difficulty of Paying Living Expenses: Not hard at all  Food Insecurity: No Food Insecurity (08/18/2021)   Hunger Vital Sign    Worried About Running Out of Food in the Last Year: Never true    Ran Out of Food in the Last Year: Never true  Transportation Needs: No Transportation Needs (08/18/2021)   PRAPARE - Administrator, Civil Service (Medical): No    Lack of Transportation (Non-Medical): No  Physical Activity: Insufficiently Active (08/18/2021)   Exercise Vital Sign    Days of Exercise per Week: 3 days    Minutes of Exercise per Session: 30 min  Stress: No Stress Concern Present (08/18/2021)   [employer] of Occupational Health -  Occupational Stress Questionnaire    Feeling of Stress : Not at all  Social Connections: Moderately Isolated (08/18/2021)   Social Connection and Isolation Panel [NHANES]    Frequency of Communication with Friends and Family: More than three times a week    Frequency of Social Gatherings with Friends and Family: More than three times a week    Attends Religious Services: Never    Database administrator or Organizations: No    Attends Banker Meetings: Never    Marital Status: Married  Catering manager  Violence: Not At Risk (08/18/2021)   Humiliation, Afraid, Rape, and Kick questionnaire    Fear of Current or Ex-Partner: No    Emotionally Abused: No    Physically Abused: No    Sexually Abused: No    FAMILY HISTORY: Family History  Problem Relation Age of Onset   Lung cancer Mother        lung   Esophageal cancer Cousin    Colon cancer Neg Hx    Rectal cancer Neg Hx    Stomach cancer Neg Hx     ALLERGIES:  has No Known Allergies.  MEDICATIONS:  Current Outpatient Medications  Medication Sig Dispense Refill   acetaminophen (TYLENOL) 500 MG tablet Take 1-2 tablets (500-1,000 mg total) by mouth every 6 (six) hours as needed for moderate pain. (Patient taking differently: Take 1,000 mg by mouth every 6 (six) hours as needed for moderate pain.) 30 tablet 0   aspirin 81 MG tablet Take 81 mg by mouth every morning.     carvedilol (COREG) 3.125 MG tablet TAKE 1 TABLET BY MOUTH TWICE A DAY WITH A MEAL (Patient taking differently: Take 3.125 mg by mouth 2 (two) times daily with a meal.) 180 tablet 1   diazepam (VALIUM) 5 MG tablet Take 1 tablet (5 mg total) by mouth every 12 (twelve) hours as needed for anxiety. 60 tablet 2   digoxin (LANOXIN) 0.25 MG tablet Take 1 tablet by mouth daily.     empagliflozin (JARDIANCE) 10 MG TABS tablet Take 1 tablet (10 mg total) by mouth daily before breakfast. 30 tablet 11   Ferrous Sulfate (IRON PO) Take 1 tablet by mouth daily. 1 tab daily      furosemide (LASIX) 20 MG tablet Take 1 tablet (20 mg total) by mouth as directed. NEEDS FOLLOW UP APPOINTMENT FOR MORE REFILLS 450 tablet 0   ibuprofen (ADVIL) 200 MG tablet Take by mouth.     ipratropium (ATROVENT) 0.03 % nasal spray Place 2 sprays into both nostrils every 12 (twelve) hours as needed for rhinitis.     KLOR-CON M20 20 MEQ tablet TAKE 2 TABLETS BY MOUTH EVERY DAY 180 tablet 1   Magnesium 500 MG TABS Take 500 mg by mouth daily.     meclizine (ANTIVERT) 25 MG tablet Take 1 tablet (25 mg total) by mouth 3 (three) times daily as needed for dizziness. 30 tablet 0   multivitamin-iron-minerals-folic acid (CENTRUM) chewable tablet Chew 1 tablet by mouth daily.     nitroGLYCERIN (NITROSTAT) 0.4 MG SL tablet Place under the tongue.     phenazopyridine (PYRIDIUM) 200 MG tablet Take 200 mg by mouth every 8 (eight) hours as needed. AZO     rosuvastatin (CRESTOR) 20 MG tablet TAKE 1 TABLET BY MOUTH EVERYDAY AT BEDTIME (Patient taking differently: Take 20 mg by mouth at bedtime.) 90 tablet 3   tamsulosin (FLOMAX) 0.4 MG CAPS capsule Take 0.4 mg by mouth daily.     traMADol (ULTRAM) 50 MG tablet Take 1 tablet (50 mg total) by mouth every 6 (six) hours as needed for moderate pain. 14 tablet 0   venlafaxine XR (EFFEXOR-XR) 150 MG 24 hr capsule TAKE 1 CAPSULE BY MOUTH DAILY WITH BREAKFAST. 90 capsule 0   No current facility-administered medications for this visit.    REVIEW OF SYSTEMS:   Constitutional: ( - ) fevers, ( - )  chills , ( - ) night sweats Eyes: ( - ) blurriness of vision, ( - ) double vision, ( - ) watery  eyes Ears, nose, mouth, throat, and face: ( - ) mucositis, ( - ) sore throat Respiratory: ( - ) cough, ( - ) dyspnea, ( - ) wheezes Cardiovascular: ( - ) palpitation, ( - ) chest discomfort, ( - ) lower extremity swelling Gastrointestinal:  ( - ) nausea, ( - ) heartburn, ( - ) change in bowel habits Skin: ( - ) abnormal skin rashes Lymphatics: ( - ) new lymphadenopathy, ( - )  easy bruising Neurological: ( - ) numbness, ( - ) tingling, ( - ) new weaknesses Behavioral/Psych: ( - ) mood change, ( - ) new changes  All other systems were reviewed with the patient and are negative.  PHYSICAL EXAMINATION: ECOG PERFORMANCE STATUS: 1 - Symptomatic but completely ambulatory  Vitals:   09/23/22 1300  BP: 133/63  Pulse: 60  Temp: 98.1 F (36.7 C)  SpO2: 97%    Filed Weights   09/23/22 1300  Weight: 281 lb 12.8 oz (127.8 kg)     GENERAL: well appearing elderly Caucasian male in NAD  SKIN: skin color, texture, turgor are normal, no rashes or significant lesions EYES: conjunctiva are pink and non-injected, sclera clear LUNGS: clear to auscultation and percussion with normal breathing effort HEART: regular rate & rhythm and no murmurs and no lower extremity edema Musculoskeletal: no cyanosis of digits and no clubbing  PSYCH: alert & oriented x 3, fluent speech NEURO: no focal motor/sensory deficits  LABORATORY DATA:  I have reviewed the data as listed    Latest Ref Rng & Units 09/23/2022   12:03 PM 09/02/2022    8:13 AM 08/12/2022    7:47 AM  CBC  WBC 4.0 - 10.5 K/uL 6.4  6.2  6.8   Hemoglobin 13.0 - 17.0 g/dL 96.2  95.2  84.1   Hematocrit 39.0 - 52.0 % 42.4  42.6  42.1   Platelets 150 - 400 K/uL 151  137  169        Latest Ref Rng & Units 09/23/2022   12:03 PM 09/02/2022    8:13 AM 08/12/2022    7:47 AM  CMP  Glucose 70 - 99 mg/dL 324  401  027   BUN 8 - 23 mg/dL 22  22  18    Creatinine 0.61 - 1.24 mg/dL 2.53  6.64  4.03   Sodium 135 - 145 mmol/L 140  138  138   Potassium 3.5 - 5.1 mmol/L 4.0  3.8  3.7   Chloride 98 - 111 mmol/L 104  104  104   CO2 22 - 32 mmol/L 28  27  26    Calcium 8.9 - 10.3 mg/dL 8.8  8.5  8.9   Total Protein 6.5 - 8.1 g/dL 7.2  7.1  7.3   Total Bilirubin 0.3 - 1.2 mg/dL 0.7  0.8  0.7   Alkaline Phos 38 - 126 U/L 85  83  89   AST 15 - 41 U/L 24  19  22    ALT 0 - 44 U/L 25  18  24      RADIOGRAPHIC STUDIES: No results  found.  ASSESSMENT & PLAN KOWEN CHIHUAHUA 67 y.o. 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  After review of the labs, review of the records, and discussion with the patient the patients findings are most consistent with BCG unresponsive high risk non-muscle invasive bladder cancer, not a candidate for cystectomy.  # BCG-Unresponsive High Risk Non-Muscle Invasive  Bladder Cancer --patient is not felt to be a candidate for cystectomy -- Recommended pembrolizumab 200 mg q. 21 days until progression or intolerance up to 24 months. --Started Cycle 1 of Pembrolizumab on 08/12/2022.  PLAN: --Due to start Cycle 3 of Pembrolizumab therapy today --Labs from today were reviewed and adequate for treatment. WBC 6.4, hemoglobin 14.1, MCV 89.5, and platelets of 151. creatinine and LFTs normal.  --Proceed with treatment today without any modifications --RTC in 3 weeks with labs and follow up visit before Cycle 4  #Urinary frequency and hematuria: -- Defer to urology for further evaluation and management --Scheduled for follow up on 09/13/2022.  #Supportive Care -- chemotherapy education to be scheduled  -- port placement not required  -- no pain medication required at this time.   No orders of the defined types were placed in this encounter.  All questions were answered. The patient knows to call the clinic with any problems, questions or concerns.  I have spent a total of 30 minutes minutes of face-to-face and non-face-to-face time, preparing to see the patient, performing a medically appropriate examination, counseling and educating the patient, documenting clinical information in the electronic health record,and care coordination.   Ulysees Barns, MD Department of Hematology/Oncology [facility] Phone: [phone] Pager: [phone] Email:  [email or web address]  09/23/2022 3:41 PM  Literature Support:  Pembrolizumab monotherapy for the treatment of high-risk non-muscle-invasive bladder cancer unresponsive to BCG ([id number]): an open-label, single-arm, multicentre, phase 2 study,The Lancet Oncology,Volume 22, Issue 7,2021,Pages 919-930,ISSN 2956-2130  -- Pembrolizumab monotherapy was tolerable and showed promising antitumour activity in patients with BCG-unresponsive non-muscle-invasive bladder cancer who declined or were ineligible for radical cystectomy and should be considered a a clinically active non-surgical treatment option in this difficult-to-treat population.  --All enrolled patients were assigned to receive pembrolizumab 200 mg intravenously every 3 weeks for up to 24 months or until centrally confirmed disease persistence, recurrence, or progression; unacceptable toxic effects

## 2022-09-23 NOTE — Patient Instructions (Signed)
[place] [facility]  Discharge Instructions: Thank you for choosing [facility] to provide your oncology and hematology care.   If you have a lab appointment with the Cancer Center, please go directly to the Cancer Center and check in at the registration area.   Wear comfortable clothing and clothing appropriate for easy access to any Portacath or PICC line.   We strive to give you quality time with your provider. You may need to reschedule your appointment if you arrive late (15 or more minutes).  Arriving late affects you and other patients whose appointments are after yours.  Also, if you miss three or more appointments without notifying the office, you may be dismissed from the clinic at the provider's discretion.      For prescription refill requests, have your pharmacy contact our office and allow 72 hours for refills to be completed.    Today you received the following chemotherapy and/or immunotherapy agent: Pembrolizumab (Keytruda)   To help prevent nausea and vomiting after your treatment, we encourage you to take your nausea medication as directed.  BELOW ARE SYMPTOMS THAT SHOULD BE REPORTED IMMEDIATELY: *FEVER GREATER THAN 100.4 F (38 C) OR HIGHER *CHILLS OR SWEATING *NAUSEA AND VOMITING THAT IS NOT CONTROLLED WITH YOUR NAUSEA MEDICATION *UNUSUAL SHORTNESS OF BREATH *UNUSUAL BRUISING OR BLEEDING *URINARY PROBLEMS (pain or burning when urinating, or frequent urination) *BOWEL PROBLEMS (unusual diarrhea, constipation, pain near the anus) TENDERNESS IN MOUTH AND THROAT WITH OR WITHOUT PRESENCE OF ULCERS (sore throat, sores in mouth, or a toothache) UNUSUAL RASH, SWELLING OR PAIN  UNUSUAL VAGINAL DISCHARGE OR ITCHING   Items with * indicate a potential emergency and should be followed up as soon as possible or go to the Emergency Department if any problems should occur.  Please show the CHEMOTHERAPY ALERT CARD or IMMUNOTHERAPY ALERT  CARD at check-in to the Emergency Department and triage nurse.  Should you have questions after your visit or need to cancel or reschedule your appointment, please contact [place] [facility]  Dept: [phone]  and follow the prompts.  Office hours are 8:00 a.m. to 4:30 p.m. Monday - Friday. Please note that voicemails left after 4:00 p.m. may not be returned until the following business day.  We are closed weekends and major holidays. You have access to a nurse at all times for urgent questions. Please call the main number to the clinic Dept: [phone] and follow the prompts.   For any non-urgent questions, you may also contact your provider using MyChart. We now offer e-Visits for anyone 18 and older to request care online for non-urgent symptoms. For details visit [email or web address].   Also download the MyChart app! Go to the app store, search "MyChart", open the app, select [place], and log in with your MyChart username and password.   

## 2022-09-25 LAB — T4: T4, Total: 7.2 ug/dL (ref 4.5–12.0)

## 2022-09-27 ENCOUNTER — Other Ambulatory Visit: Payer: Self-pay

## 2022-10-10 DIAGNOSIS — D3131 Benign neoplasm of right choroid: Secondary | ICD-10-CM | POA: Diagnosis not present

## 2022-10-10 DIAGNOSIS — H43812 Vitreous degeneration, left eye: Secondary | ICD-10-CM | POA: Diagnosis not present

## 2022-10-10 DIAGNOSIS — Z961 Presence of intraocular lens: Secondary | ICD-10-CM | POA: Diagnosis not present

## 2022-10-10 DIAGNOSIS — H35033 Hypertensive retinopathy, bilateral: Secondary | ICD-10-CM | POA: Diagnosis not present

## 2022-10-10 DIAGNOSIS — H52223 Regular astigmatism, bilateral: Secondary | ICD-10-CM | POA: Diagnosis not present

## 2022-10-14 ENCOUNTER — Other Ambulatory Visit: Payer: Self-pay

## 2022-10-14 ENCOUNTER — Inpatient Hospital Stay: Payer: PPO | Attending: Hematology and Oncology

## 2022-10-14 ENCOUNTER — Inpatient Hospital Stay (HOSPITAL_BASED_OUTPATIENT_CLINIC_OR_DEPARTMENT_OTHER): Payer: PPO | Admitting: Hematology and Oncology

## 2022-10-14 ENCOUNTER — Inpatient Hospital Stay: Payer: PPO

## 2022-10-14 VITALS — BP 159/78 | HR 75 | Temp 97.7°F | Resp 19 | Ht 66.0 in | Wt 282.1 lb

## 2022-10-14 DIAGNOSIS — Z5112 Encounter for antineoplastic immunotherapy: Secondary | ICD-10-CM

## 2022-10-14 DIAGNOSIS — C679 Malignant neoplasm of bladder, unspecified: Secondary | ICD-10-CM

## 2022-10-14 DIAGNOSIS — Z79899 Other long term (current) drug therapy: Secondary | ICD-10-CM | POA: Insufficient documentation

## 2022-10-14 LAB — CMP (CANCER CENTER ONLY)
ALT: 27 U/L (ref 0–44)
AST: 26 U/L (ref 15–41)
Albumin: 3.9 g/dL (ref 3.5–5.0)
Alkaline Phosphatase: 83 U/L (ref 38–126)
Anion gap: 8 (ref 5–15)
BUN: 21 mg/dL (ref 8–23)
CO2: 27 mmol/L (ref 22–32)
Calcium: 8.9 mg/dL (ref 8.9–10.3)
Chloride: 104 mmol/L (ref 98–111)
Creatinine: 1.15 mg/dL (ref 0.61–1.24)
GFR, Estimated: >60 mL/min (ref >60)
Glucose, Bld: 150 mg/dL — ABNORMAL HIGH (ref 70–99)
Potassium: 3.7 mmol/L (ref 3.5–5.1)
Sodium: 139 mmol/L (ref 135–145)
Total Bilirubin: 0.9 mg/dL (ref 0.3–1.2)
Total Protein: 6.9 g/dL (ref 6.5–8.1)

## 2022-10-14 LAB — CBC WITH DIFFERENTIAL (CANCER CENTER ONLY)
Abs Immature Granulocytes: 0.02 10*3/uL (ref 0.00–0.07)
Basophils Absolute: 0 10*3/uL (ref 0.0–0.1)
Basophils Relative: 1 %
Eosinophils Absolute: 0.3 10*3/uL (ref 0.0–0.5)
Eosinophils Relative: 4 %
HCT: 43.8 % (ref 39.0–52.0)
Hemoglobin: 14.5 g/dL (ref 13.0–17.0)
Immature Granulocytes: 0 %
Lymphocytes Relative: 24 %
Lymphs Abs: 1.6 10*3/uL (ref 0.7–4.0)
MCH: 29.6 pg (ref 26.0–34.0)
MCHC: 33.1 g/dL (ref 30.0–36.0)
MCV: 89.4 fL (ref 80.0–100.0)
Monocytes Absolute: 0.5 10*3/uL (ref 0.1–1.0)
Monocytes Relative: 7 %
Neutro Abs: 4.2 10*3/uL (ref 1.7–7.7)
Neutrophils Relative %: 64 %
Platelet Count: 144 10*3/uL — ABNORMAL LOW (ref 150–400)
RBC: 4.9 MIL/uL (ref 4.22–5.81)
RDW: 14.5 % (ref 11.5–15.5)
WBC Count: 6.6 10*3/uL (ref 4.0–10.5)
nRBC: 0 % (ref 0.0–0.2)

## 2022-10-14 MED ORDER — SODIUM CHLORIDE 0.9 % IV SOLN
200.0000 mg | Freq: Once | INTRAVENOUS | Status: AC
  Administered 2022-10-14: 200 mg via INTRAVENOUS
  Filled 2022-10-14: qty 200

## 2022-10-14 MED ORDER — SODIUM CHLORIDE 0.9 % IV SOLN: Freq: Once | INTRAVENOUS | Status: AC

## 2022-10-14 NOTE — Progress Notes (Signed)
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 [name]  08/12/2022: Cycle 1 of Pembrolizumab 09/02/2022: Cycle 2 of Pembrolizumab  09/23/2022: Cycle 3 of Pembrolizumab 10/14/2022: Cycle 4 of Pembrolizumab  CHIEF COMPLAINTS/PURPOSE OF CONSULTATION:  "High Risk Non-Muscle Invasive Bladder Cancer "  HISTORY OF PRESENTING ILLNESS:  Luis [name] 67 y.o. male with medical history significant for BCG unresponsive high risk nonmuscle invasive bladder cancer who presents for a follow up before starting Cycle 4 of Pembrolizumab therapy. He is accompanied by his wife for this visit.   On exam today on exam today Mr. Houseworth reports he has been well overall in the interim since last visit.  He did develop a rash on his arm 1 week after his last treatment.  It was itchy.  It was isolated to his left arm.  He noted he has been using a 5% steroid cream and has been very effective in reducing the rash.  Is almost gone at this point.  He is not having any other side effects as a result of his Keytruda treatment.  He reports that his legs do feel heavy and he does have some  occasional swelling.  He does that occasionally his urine is dark but has not seen any gross blood.  He has not had any infectious symptoms any kind.  He notes he is looking forward to a vacation to [facility] which he will start once he leaves here today.  He is not having any nausea, vomiting, or diarrhea.. He denies fevers, chills, sweats, shortness of breath, chest pain, cough, rash or peripheral edema. He has no other complaints. A full 10 point ROS is otherwise negative.  MEDICAL HISTORY:  Past Medical History:  Diagnosis Date   Anxiety    takes Valium as needed   Aortic stenosis, moderate    Arthritis    Ascending aortic aneurysm (HCC)    CAD (coronary artery disease)    a. s/p PCI of the RCA 8/12 with DES by Dr Excell Seltzer, preserved EF. b. LHC/RHC (2/16) with mean RA 12, PA 32/15, mean PCWP 18, CI 3.47; patent mid and distal RCA stents, 50-60% proximal stenosis small PDA.      Cancer [facility])    bladder   Carotid stenosis    a. Carotid US (05/2013):  Bilateral 1-39% ICA; L thyroid nodule (prior hx of aspiration).   Chronic diastolic CHF (congestive heart failure) (HCC)    Complication of anesthesia    difficulty waking up after gallbladder surgery   Depression    Dyspnea    Essential hypertension    GERD (gastroesophageal reflux disease)    if needed will take OTC meds    Heart murmur    History of colonic polyps    hyperplastic   Hyperlipidemia  Joint pain    Lesion of bladder    Myocardial infarction (HCC) 2012   Obesity (BMI 30-39.9) 02/29/2016   Pre-diabetes    Restless leg    Sleep apnea    uses cpap   Tubular adenoma of colon    Vertigo    takes Meclizine as needed    SURGICAL HISTORY: Past Surgical History:  Procedure Laterality Date   AORTIC VALVE REPLACEMENT N/A 09/16/2021   Procedure: AORTIC VALVE REPLACEMENT (AVR);  Surgeon: Alleen Borne, MD;  Location: [facility] OR;  Service: Open Heart Surgery;  Laterality: N/A;   CATARACT EXTRACTION   4 YRS AGO   BOTH EYES    CHOLECYSTECTOMY  07/21/2011   Procedure: LAPAROSCOPIC CHOLECYSTECTOMY WITH INTRAOPERATIVE CHOLANGIOGRAM;  Surgeon: Kandis Cocking, MD;  Location: [facility] ORS;  Service: General;  Laterality: N/A;   CORONARY ANGIOPLASTY  2012   2 stents   coronary stenting     s/p PCI of the RCA by Dr Excell Seltzer 8/12 with 2 promus stents   CYSTOSCOPY W/ RETROGRADES Bilateral 12/13/2019   Procedure: CYSTOSCOPY WITH RETROGRADE PYELOGRAM;  Surgeon: Sebastian Ache, MD;  Location: [facility];  Service: Urology;  Laterality: Bilateral;   CYSTOSCOPY W/ RETROGRADES Bilateral 10/14/2020   Procedure: CYSTOSCOPY WITH RETROGRADE PYELOGRAM;  Surgeon: Sebastian Ache, MD;  Location: [facility];  Service: Urology;  Laterality: Bilateral;   CYSTOSCOPY W/ RETROGRADES Bilateral 12/16/2020   Procedure: CYSTOSCOPY WITH RETROGRADE PYELOGRAM;  Surgeon: Sebastian Ache, MD;  Location: [facility];  Service: Urology;  Laterality: Bilateral;   CYSTOSCOPY W/ RETROGRADES Bilateral 06/03/2022   Procedure: CYSTOSCOPY WITH RETROGRADE PYELOGRAM;  Surgeon: Sebastian Ache, MD;  Location: [facility] ORS;  Service: Urology;  Laterality: Bilateral;   LEFT AND RIGHT HEART CATHETERIZATION WITH CORONARY ANGIOGRAM N/A 06/23/2014   Procedure: LEFT AND RIGHT HEART CATHETERIZATION WITH CORONARY ANGIOGRAM;  Surgeon: Laurey Morale, MD;  Location: [facility] CATH LAB;  Service: Cardiovascular;  Laterality: N/A;   NECK SURGERY  03/23/09   per Dr. Ophelia Charter, cervical fusion    PERICARDIOCENTESIS N/A 09/28/2021   Procedure: PERICARDIOCENTESIS;  Surgeon: Corky Crafts, MD;  Location: [facility] INVASIVE CV LAB;  Service: Cardiovascular;  Laterality: N/A;   REPLACEMENT ASCENDING AORTA N/A 09/16/2021   Procedure: REPLACEMENT ASCENDING AORTA WITH 30 X HEMASHIELD PLATINUM WOVEN DOUBLE VELOUR VASCULAR GRAFT;  Surgeon: Alleen Borne, MD;  Location: [facility] OR;  Service: Open Heart Surgery;  Laterality: N/A;  CIRC ARREST   right elbow surgery      RIGHT HEART CATH AND CORONARY ANGIOGRAPHY N/A 07/08/2021   Procedure: RIGHT HEART CATH AND CORONARY ANGIOGRAPHY;  Surgeon: Laurey Morale, MD;  Location: [facility] INVASIVE CV LAB;  Service: Cardiovascular;  Laterality: N/A;   solonscopy  05/23/08   per Dr. Celene Kras hemorrhoids only, repeat in 5 years   SUBXYPHOID PERICARDIAL WINDOW N/A 09/28/2021   Procedure: SUBXYPHOID PERICARDIAL WINDOW;  Surgeon: Alleen Borne, MD;  Location: [facility] OR;  Service: Thoracic;  Laterality: N/A;   TEE WITHOUT CARDIOVERSION N/A 06/23/2014   Procedure: TRANSESOPHAGEAL ECHOCARDIOGRAM (TEE);  Surgeon: Laurey Morale, MD;  Location: [facility] ENDOSCOPY;  Service: Cardiovascular;  Laterality: N/A;   TEE WITHOUT CARDIOVERSION N/A 01/21/2016   Procedure: TRANSESOPHAGEAL ECHOCARDIOGRAM (TEE);  Surgeon: Laurey Morale, MD;  Location: [facility] ENDOSCOPY;  Service: Cardiovascular;  Laterality: N/A;   TEE WITHOUT CARDIOVERSION N/A 09/16/2021   Procedure: TRANSESOPHAGEAL ECHOCARDIOGRAM (TEE);  Surgeon: Alleen Borne, MD;  Location[facility] OR;  Service: Open Heart Surgery;  Laterality:  N/A;   TEE WITHOUT CARDIOVERSION N/A 09/28/2021   Procedure: TRANSESOPHAGEAL ECHOCARDIOGRAM (TEE);  Surgeon: Alleen Borne, MD;  Location: [facility] OR;  Service: Thoracic;  Laterality: N/A;   TONSILLECTOMY     TRANSURETHRAL RESECTION OF BLADDER TUMOR N/A 10/21/2019   Procedure: TRANSURETHRAL RESECTION OF BLADDER TUMOR (TURBT);  Surgeon: Ihor Gully, MD;  Location: [facility];  Service: Urology;  Laterality: N/A;   TRANSURETHRAL RESECTION OF BLADDER TUMOR N/A 12/13/2019   Procedure: TRANSURETHRAL RESECTION OF BLADDER TUMOR (TURBT);  Surgeon: Sebastian Ache, MD;  Location: [facility];  Service: Urology;  Laterality: N/A;  1 HR   TRANSURETHRAL RESECTION OF BLADDER TUMOR N/A 10/14/2020   Procedure: TRANSURETHRAL RESECTION OF BLADDER TUMOR (TURBT);  Surgeon: Sebastian Ache, MD;  Location: [facility];  Service: Urology;   Laterality: N/A;   TRANSURETHRAL RESECTION OF BLADDER TUMOR N/A 12/16/2020   Procedure: RESTAGING TRANSURETHRAL RESECTION OF BLADDER TUMOR (TURBT);  Surgeon: Sebastian Ache, MD;  Location: [facility];  Service: Urology;  Laterality: N/A;   TRANSURETHRAL RESECTION OF BLADDER TUMOR N/A 06/03/2022   Procedure: TRANSURETHRAL RESECTION OF BLADDER TUMOR (TURBT);  Surgeon: Sebastian Ache, MD;  Location: [facility] ORS;  Service: Urology;  Laterality: N/A;    SOCIAL HISTORY: Social History   Socioeconomic History   Marital status: Married    Spouse name: Not on file   Number of children: 4   Years of education: Not on file   Highest education level: Not on file  Occupational History   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  Tobacco Use   Smoking status: Former    Packs/day: 2.00    Years: 40.00    Additional pack years: 0.00    Total pack years: 80.00    Types: Cigarettes    Quit date: 2012    Years since quitting: 12.4   Smokeless tobacco: Never  Vaping Use   Vaping Use: Never used  Substance and Sexual Activity   Alcohol use: No    Alcohol/week: 0.0 standard drinks of alcohol   Drug use: No   Sexual activity: Not on file  Other Topics Concern   Not on file  Social History Narrative   Not on file   Social Determinants of Health   Financial Resource Strain: Low Risk  (08/18/2021)   Overall Financial Resource Strain (CARDIA)    Difficulty of Paying Living Expenses: Not hard at all  Food Insecurity: No Food Insecurity (08/18/2021)   Hunger Vital Sign    Worried About Running Out of Food in the Last Year: Never true    Ran Out of Food in the Last Year: Never true  Transportation Needs: No Transportation Needs (08/18/2021)   PRAPARE - Administrator, Civil Service (Medical): No    Lack of Transportation (Non-Medical): No  Physical Activity: Insufficiently Active (08/18/2021)   Exercise Vital Sign    Days of Exercise per Week: 3  days    Minutes of Exercise per Session: 30 min  Stress: No Stress Concern Present (08/18/2021)   [employer] of Occupational Health - Occupational Stress Questionnaire    Feeling of Stress : Not at all  Social Connections: Moderately Isolated (08/18/2021)   Social Connection and Isolation Panel [NHANES]    Frequency of Communication with Friends and Family: More than three times a week    Frequency of Social Gatherings with Friends and Family: More than three times a week  Attends Religious Services: Never    Active Member of Clubs or Organizations: No    Attends Banker Meetings: Never    Marital Status: Married  Catering manager Violence: Not At Risk (08/18/2021)   Humiliation, Afraid, Rape, and Kick questionnaire    Fear of Current or Ex-Partner: No    Emotionally Abused: No    Physically Abused: No    Sexually Abused: No    FAMILY HISTORY: Family History  Problem Relation Age of Onset   Lung cancer Mother        lung   Esophageal cancer Cousin    Colon cancer Neg Hx    Rectal cancer Neg Hx    Stomach cancer Neg Hx     ALLERGIES:  has No Known Allergies.  MEDICATIONS:  Current Outpatient Medications  Medication Sig Dispense Refill   acetaminophen (TYLENOL) 500 MG tablet Take 1-2 tablets (500-1,000 mg total) by mouth every 6 (six) hours as needed for moderate pain. (Patient taking differently: Take 1,000 mg by mouth every 6 (six) hours as needed for moderate pain.) 30 tablet 0   aspirin 81 MG tablet Take 81 mg by mouth every morning.     carvedilol (COREG) 3.125 MG tablet TAKE 1 TABLET BY MOUTH TWICE A DAY WITH A MEAL (Patient taking differently: Take 3.125 mg by mouth 2 (two) times daily with a meal.) 180 tablet 1   diazepam (VALIUM) 5 MG tablet Take 1 tablet (5 mg total) by mouth every 12 (twelve) hours as needed for anxiety. 60 tablet 2   digoxin (LANOXIN) 0.25 MG tablet Take 1 tablet by mouth daily.     empagliflozin (JARDIANCE) 10 MG TABS tablet  Take 1 tablet (10 mg total) by mouth daily before breakfast. 30 tablet 11   Ferrous Sulfate (IRON PO) Take 1 tablet by mouth daily. 1 tab daily     furosemide (LASIX) 20 MG tablet Take 1 tablet (20 mg total) by mouth as directed. NEEDS FOLLOW UP APPOINTMENT FOR MORE REFILLS 450 tablet 0   ibuprofen (ADVIL) 200 MG tablet Take by mouth.     ipratropium (ATROVENT) 0.03 % nasal spray Place 2 sprays into both nostrils every 12 (twelve) hours as needed for rhinitis.     KLOR-CON M20 20 MEQ tablet TAKE 2 TABLETS BY MOUTH EVERY DAY 180 tablet 1   Magnesium 500 MG TABS Take 500 mg by mouth daily.     meclizine (ANTIVERT) 25 MG tablet Take 1 tablet (25 mg total) by mouth 3 (three) times daily as needed for dizziness. 30 tablet 0   multivitamin-iron-minerals-folic acid (CENTRUM) chewable tablet Chew 1 tablet by mouth daily.     nitroGLYCERIN (NITROSTAT) 0.4 MG SL tablet Place under the tongue.     phenazopyridine (PYRIDIUM) 200 MG tablet Take 200 mg by mouth every 8 (eight) hours as needed. AZO     rosuvastatin (CRESTOR) 20 MG tablet TAKE 1 TABLET BY MOUTH EVERYDAY AT BEDTIME (Patient taking differently: Take 20 mg by mouth at bedtime.) 90 tablet 3   tamsulosin (FLOMAX) 0.4 MG CAPS capsule Take 0.4 mg by mouth daily.     traMADol (ULTRAM) 50 MG tablet Take 1 tablet (50 mg total) by mouth every 6 (six) hours as needed for moderate pain. 14 tablet 0   venlafaxine XR (EFFEXOR-XR) 150 MG 24 hr capsule TAKE 1 CAPSULE BY MOUTH DAILY WITH BREAKFAST. 90 capsule 0   No current facility-administered medications for this visit.   Facility-Administered Medications Ordered in Other Visits  Medication Dose Route Frequency Provider Last Rate Last Admin   0.9 %  sodium chloride infusion   Intravenous Once Jaci Standard, MD       pembrolizumab [facility]) 200 mg in sodium chloride 0.9 % 50 mL chemo infusion  200 mg Intravenous Once Jaci Standard, MD        REVIEW OF SYSTEMS:   Constitutional: ( - ) fevers, ( - )   chills , ( - ) night sweats Eyes: ( - ) blurriness of vision, ( - ) double vision, ( - ) watery eyes Ears, nose, mouth, throat, and face: ( - ) mucositis, ( - ) sore throat Respiratory: ( - ) cough, ( - ) dyspnea, ( - ) wheezes Cardiovascular: ( - ) palpitation, ( - ) chest discomfort, ( - ) lower extremity swelling Gastrointestinal:  ( - ) nausea, ( - ) heartburn, ( - ) change in bowel habits Skin: ( - ) abnormal skin rashes Lymphatics: ( - ) new lymphadenopathy, ( - ) easy bruising Neurological: ( - ) numbness, ( - ) tingling, ( - ) new weaknesses Behavioral/Psych: ( - ) mood change, ( - ) new changes  All other systems were reviewed with the patient and are negative.  PHYSICAL EXAMINATION: ECOG PERFORMANCE STATUS: 1 - Symptomatic but completely ambulatory  Vitals:   10/14/22 1007  BP: (!) 159/78  Pulse: 75  Resp: 19  Temp: 97.7 F (36.5 C)  SpO2: 96%     Filed Weights   10/14/22 1007  Weight: 282 lb 1 oz (127.9 kg)      GENERAL: well appearing elderly Caucasian male in NAD  SKIN: skin color, texture, turgor are normal, no rashes or significant lesions EYES: conjunctiva are pink and non-injected, sclera clear LUNGS: clear to auscultation and percussion with normal breathing effort HEART: regular rate & rhythm and no murmurs and no lower extremity edema Musculoskeletal: no cyanosis of digits and no clubbing  PSYCH: alert & oriented x 3, fluent speech NEURO: no focal motor/sensory deficits  LABORATORY DATA:  I have reviewed the data as listed    Latest Ref Rng & Units 10/14/2022    9:29 AM 09/23/2022   12:03 PM 09/02/2022    8:13 AM  CBC  WBC 4.0 - 10.5 K/uL 6.6  6.4  6.2   Hemoglobin 13.0 - 17.0 g/dL 69.6  29.5  28.4   Hematocrit 39.0 - 52.0 % 43.8  42.4  42.6   Platelets 150 - 400 K/uL 144  151  137        Latest Ref Rng & Units 10/14/2022    9:29 AM 09/23/2022   12:03 PM 09/02/2022    8:13 AM  CMP  Glucose 70 - 99 mg/dL 132  440  102   BUN 8 - 23 mg/dL 21   22  22    Creatinine 0.61 - 1.24 mg/dL 7.25  3.66  4.40   Sodium 135 - 145 mmol/L 139  140  138   Potassium 3.5 - 5.1 mmol/L 3.7  4.0  3.8   Chloride 98 - 111 mmol/L 104  104  104   CO2 22 - 32 mmol/L 27  28  27    Calcium 8.9 - 10.3 mg/dL 8.9  8.8  8.5   Total Protein 6.5 - 8.1 g/dL 6.9  7.2  7.1   Total Bilirubin 0.3 - 1.2 mg/dL 0.9  0.7  0.8   Alkaline Phos 38 - 126 U/L 83  85  83   AST 15 - 41 U/L 26  24  19    ALT 0 - 44 U/L 27  25  18      RADIOGRAPHIC STUDIES: No results found.  ASSESSMENT & PLAN ILAI HILLER 67 y.o. 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  After review of the labs, review of the records, and discussion with the patient the patients findings are most consistent with BCG unresponsive high risk non-muscle invasive bladder cancer, not a candidate for cystectomy.  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 21 days until progression or intolerance up to 24 months. --Started Cycle 1 of Pembrolizumab on 08/12/2022.  PLAN: --Due to start Cycle 4 of Pembrolizumab therapy today --Labs from today were reviewed and adequate for treatment. WBC 6.6, Hgb 14.5, MCV 89.4, Plt 144. creatinine and LFTs normal.  --Proceed with treatment today without any modifications --CT scans are not able to monitor for progression of his cancer.  Cystoscopies are the preferred monitoring strategy for his cancer. --RTC in 3 weeks with labs and follow up visit before Cycle 5  #Urinary frequency and hematuria: -- Defer to urology for further evaluation and management  #Supportive Care -- chemotherapy education to be scheduled  -- port placement not required  -- no pain medication required at this time.   No orders of the defined types were placed in this encounter.  All questions  were answered. The patient knows to call the clinic with any problems, questions or concerns.  I have spent a total of 30 minutes minutes of face-to-face and non-face-to-face time, preparing to see the patient, performing a medically appropriate examination, counseling and educating the patient, documenting clinical information in the electronic health record,and care coordination.   Ulysees Barns, MD Department of Hematology/Oncology [facility] at [facility] Phone: [phone] Pager: [phone] Email: [email or web address]  10/14/2022 10:37 AM  Literature Support:  Pembrolizumab monotherapy for the treatment of high-risk non-muscle-invasive bladder cancer unresponsive to BCG ([id number]): an open-label, single-arm, multicentre, phase 2 study,The Lancet Oncology,Volume 22, Issue 7,2021,Pages 919-930,ISSN 0865-7846  -- Pembrolizumab monotherapy was tolerable and showed promising antitumour activity in patients with BCG-unresponsive non-muscle-invasive bladder cancer who declined or were ineligible for radical cystectomy and should be considered a a clinically active non-surgical treatment option in this difficult-to-treat population.  --All enrolled patients were assigned to receive pembrolizumab 200 mg intravenously every 3 weeks for up to 24 months or until centrally confirmed disease persistence, recurrence, or progression; unacceptable toxic effects

## 2022-10-18 ENCOUNTER — Other Ambulatory Visit: Payer: Self-pay

## 2022-10-29 ENCOUNTER — Other Ambulatory Visit: Payer: Self-pay

## 2022-11-01 ENCOUNTER — Other Ambulatory Visit: Payer: Self-pay | Admitting: Surgery

## 2022-11-01 DIAGNOSIS — I7121 Aneurysm of the ascending aorta, without rupture: Secondary | ICD-10-CM

## 2022-11-03 ENCOUNTER — Telehealth (INDEPENDENT_AMBULATORY_CARE_PROVIDER_SITE_OTHER): Payer: PPO | Admitting: Family Medicine

## 2022-11-03 ENCOUNTER — Encounter: Payer: Self-pay | Admitting: Family Medicine

## 2022-11-03 VITALS — Wt 278.0 lb

## 2022-11-03 DIAGNOSIS — Z0000 Encounter for general adult medical examination without abnormal findings: Secondary | ICD-10-CM | POA: Diagnosis not present

## 2022-11-03 NOTE — Patient Instructions (Addendum)
I really enjoyed getting to talk with you today! I am available on Tuesdays and Thursdays for virtual visits if you have any questions or concerns, or if I can be of any further assistance.   CHECKLIST FROM ANNUAL WELLNESS VISIT:  -Follow up (please call to schedule if not scheduled after visit):   -yearly for annual wellness visit with primary care office  Here is a list of your preventive care/health maintenance measures and the plan for each if any are due:  PLAN For any measures below that may be due:   Health Maintenance  Topic Date Due   Diabetic kidney evaluation - Urine ACR  Never done   Hepatitis C Screening  Never done   DTaP/Tdap/Td (1 - Tdap) Never done   Zoster Vaccines- Shingrix (1 of 2) Never done   Pneumonia Vaccine 50+ Years old (1 of 1 - PCV) Never done   COVID-19 Vaccine (4 - 2023-24 season) 12/24/2021   Lung Cancer Screening  09/29/2022   INFLUENZA VACCINE  11/24/2022   Diabetic kidney evaluation - eGFR measurement  10/14/2023   Medicare Annual Wellness (AWV)  11/03/2023   Colonoscopy  10/03/2025   HPV VACCINES  Aged Out    -See a dentist at least yearly  -Get your eyes checked and then per your eye specialist's recommendations  -Other issues addressed today:   -I have included below further information regarding a healthy whole foods based diet, physical activity guidelines for adults, stress management and opportunities for social connections. I hope you find this information useful.   -----------------------------------------------------------------------------------------------------------------------------------------------------------------------------------------------------------------------------------------------------------  NUTRITION: -eat real food: lots of colorful vegetables (half the plate) and fruits -5-7 servings of vegetables and fruits per day (fresh or steamed is best), exp. 2 servings of vegetables with lunch and dinner and 2 servings  of fruit per day. Berries and greens such as kale and collards are great choices.  -consume on a regular basis: whole grains (make sure first ingredient on label contains the word "whole"), fresh fruits, fish, nuts, seeds, healthy oils (such as olive oil, avocado oil, grape seed oil) -may eat small amounts of dairy and lean meat on occasion, but avoid processed meats such as ham, bacon, lunch meat, etc. -drink water -try to avoid fast food and pre-packaged foods, processed meat -most experts advise limiting sodium to < 2300mg  per day, should limit further is any chronic conditions such as high blood pressure, heart disease, diabetes, etc. The American Heart Association advised that < 1500mg  is is ideal -try to avoid foods that contain any ingredients with names you do not recognize  -try to avoid sugar/sweets (except for the natural sugar that occurs in fresh fruit) -try to avoid sweet drinks -try to avoid white rice, white bread, pasta (unless whole grain), white or yellow potatoes  EXERCISE GUIDELINES FOR ADULTS: -if you wish to increase your physical activity, do so gradually and with the approval of your doctor -STOP and seek medical care immediately if you have any chest pain, chest discomfort or trouble breathing when starting or increasing exercise  -move and stretch your body, legs, feet and arms when sitting for long periods -Physical activity guidelines for optimal health in adults: -least 150 minutes per week of aerobic exercise (can talk, but not sing) once approved by your doctor, 20-30 minutes of sustained activity or two 10 minute episodes of sustained activity every day.  -resistance training at least 2 days per week if approved by your doctor -balance exercises 3+ days per week:  Stand somewhere where you have something sturdy to hold onto if you lose balance.    1) lift up on toes, start with 5x per day and work up to 20x   2) stand and lift on leg straight out to the side so  that foot is a few inches of the floor, start with 5x each side and work up to 20x each side   3) stand on one foot, start with 5 seconds each side and work up to 20 seconds on each side  If you need ideas or help with getting more active:  -Silver sneakers https://tools.silversneakers.com  -Walk with a Doc: http://www.duncan-williams.com/  -try to include resistance (weight lifting/strength building) and balance exercises twice per week: or the following link for ideas: http://castillo-powell.com/  BuyDucts.dk  STRESS MANAGEMENT: -can try meditating, or just sitting quietly with deep breathing while intentionally relaxing all parts of your body for 5 minutes daily -if you need further help with stress, anxiety or depression please follow up with your primary doctor or contact the wonderful folks at [facility]: [phone]  SOCIAL CONNECTIONS: -options in [place] if you wish to engage in more social and exercise related activities:  -Silver sneakers https://tools.silversneakers.com  -Walk with a Doc: http://www.duncan-williams.com/  -Check out the [facility] Active Adults 50+ section on the [employer] (hiking clubs, book clubs, cards and games, chess, exercise classes, aquatic classes and much more) - see the website for details: https://www.[place].gov/departments/parks-recreation/active-adults50  -YouTube has lots of exercise videos for different ages and abilities as well  -[facility] (a variety of indoor and outdoor inperson activities for adults). [phone]. [address]  -Virtual Online Classes (a variety of topics): see seniorplanet.org or call [phone]  -consider volunteering at a school, hospice center, church, senior center or elsewhere

## 2022-11-03 NOTE — Progress Notes (Signed)
PATIENT CHECK-IN and HEALTH RISK ASSESSMENT QUESTIONNAIRE:  -completed by phone/video for upcoming Medicare Preventive Visit  Pre-Visit Check-in: 1)Vitals (height, wt, BP, etc) - record in vitals section for visit on day of visit 2)Review and Update Medications, Allergies PMH, Surgeries, Social history in Epic 3)Hospitalizations in the last year with date/reason? No  4)Review and Update Care Team (patient's specialists) in Epic 5) Complete PHQ9 in Epic  6) Complete Fall Screening in Epic 7)Review all Health Maintenance Due and order under PCP if not done.  Medicare Wellness Patient Questionnaire:  Answer theses question about your habits: Do you drink alcohol? no Have you ever smoked?Yes  Quit date if applicable? 13 years ago   How many packs a day do/did you smoke? 1-2 Do you use smokeless tobacco?No  Do you use an illicit drugs?no Do you exercises? No, but runs around after his grandkids, he walks some  Are you sexually active?  No   Typical breakfast: Bacon/sausage,eggs, Grits, Biscuit Typical lunch: Varies  Typical dinner : Varies Typical snacks: Chips  Beverages: Soda and water  Answer theses question about you: Can you perform most household chores?Yes  Do you find it hard to follow a conversation in a noisy room? No Do you often ask people to speak up or repeat themselves?No Do you feel that you have a problem with memory?No Do you balance your checkbook and or bank acounts? No Do you feel safe at home? Yes  Last dentist visit? N/A Do you need assistance with any of the following: Please note if so No  Driving?  Feeding yourself?  Getting from bed to chair?  Getting to the toilet?  Bathing or showering?  Dressing yourself?  Managing money?  Climbing a flight of stairs Yes   Preparing meals?    Do you have Advanced Directives in place (Living Will, Healthcare Power or Attorney)? Yes   Last eye Exam and location? [facility] 1 month ago    Do you  currently use prescribed or non-prescribed narcotic or opioid pain medications? No   Do you have a history or close family history of breast, ovarian, tubal or peritoneal cancer or a family member with BRCA (breast cancer susceptibility 1 and 2) gene mutations? No  Nurse/Assistant Credentials/time stamp: Mg 4:12 PM   ----------------------------------------------------------------------------------------------------------------------------------------------------------------------------------------------------------------------    MEDICARE ANNUAL PREVENTIVE CARE VISIT WITH PROVIDER (Welcome to Medicare, initial annual wellness or annual wellness exam)  Virtual Visit via Phone Note  I connected with Luis [name] on 11/03/22  by phone and verified that I am speaking with the correct person using two identifiers.  Location patient: home Location provider:work or home office Persons participating in the virtual visit: patient, provider  Concerns and/or follow up today: reports is doing ok. Undergoing infusions for bladder cancer - but feels is tolerating tx well and feels well.    See HM section in Epic for other details of completed HM.    ROS: negative for report of fevers, vision changes, vision loss, hearing loss or change, chest pain, sob, hemoptysis, melena, hematochezia, falls, bleeding or bruising, thoughts of suicide or self harm, memory loss  Patient-completed extensive health risk assessment - reviewed and discussed with the patient: See Health Risk Assessment completed with patient prior to the visit either above or in recent phone note. This was reviewed in detailed with the patient today and appropriate recommendations, orders and referrals were placed as needed per Summary below and patient instructions.   Review of Medical History: -PMH, PSH,  Family History and current specialty and care providers reviewed and updated and listed below   Patient Care Team: Nelwyn Salisbury, MD as PCP - General Quintella Reichert, MD as PCP - Sleep Medicine (Cardiology) Laurey Morale, MD as PCP - Advanced Heart Failure (Cardiology) Hillis Range, MD (Inactive) as Consulting Physician (Cardiology) Verner Chol, [facility] (Inactive) as Pharmacist (Pharmacist)   Past Medical History:  Diagnosis Date   Anxiety    takes Valium as needed   Aortic stenosis, moderate    Arthritis    Ascending aortic aneurysm (HCC)    CAD (coronary artery disease)    a. s/p PCI of the RCA 8/12 with DES by Dr Excell Seltzer, preserved EF. b. LHC/RHC (2/16) with mean RA 12, PA 32/15, mean PCWP 18, CI 3.47; patent mid and distal RCA stents, 50-60% proximal stenosis small PDA.      Cancer [facility])    bladder   Carotid stenosis    a. Carotid US (05/2013):  Bilateral 1-39% ICA; L thyroid nodule (prior hx of aspiration).   Chronic diastolic CHF (congestive heart failure) (HCC)    Complication of anesthesia    difficulty waking up after gallbladder surgery   Depression    Dyspnea    Essential hypertension    GERD (gastroesophageal reflux disease)    if needed will take OTC meds    Heart murmur    History of colonic polyps    hyperplastic   Hyperlipidemia    Joint pain    Lesion of bladder    Myocardial infarction (HCC) 2012   Obesity (BMI 30-39.9) 02/29/2016   Pre-diabetes    Restless leg    Sleep apnea    uses cpap   Tubular adenoma of colon    Vertigo    takes Meclizine as needed    Past Surgical History:  Procedure Laterality Date   AORTIC VALVE REPLACEMENT N/A 09/16/2021   Procedure: AORTIC VALVE REPLACEMENT (AVR);  Surgeon: Alleen Borne, MD;  [facility];  Service: Open Heart Surgery;  Laterality: N/A;   CATARACT EXTRACTION   4 YRS AGO   BOTH EYES   CHOLECYSTECTOMY  07/21/2011   Procedure: LAPAROSCOPIC CHOLECYSTECTOMY WITH INTRAOPERATIVE CHOLANGIOGRAM;  Surgeon: Kandis Cocking, MD;  Location: [facility] ORS;  Service: General;  Laterality: N/A;   CORONARY ANGIOPLASTY  2012   2 stents    coronary stenting     s/p PCI of the RCA by Dr Excell Seltzer 8/12 with 2 promus stents   CYSTOSCOPY W/ RETROGRADES Bilateral 12/13/2019   Procedure: CYSTOSCOPY WITH RETROGRADE PYELOGRAM;  Surgeon: Sebastian Ache, MD;  Location[facility];  Service: Urology;  Laterality: Bilateral;   CYSTOSCOPY W/ RETROGRADES Bilateral 10/14/2020   Procedure: CYSTOSCOPY WITH RETROGRADE PYELOGRAM;  Surgeon: Sebastian Ache, MD;  Location: [facility];  Service: Urology;  Laterality: Bilateral;   CYSTOSCOPY W/ RETROGRADES Bilateral 12/16/2020   Procedure: CYSTOSCOPY WITH RETROGRADE PYELOGRAM;  Surgeon: Sebastian Ache, MD;  Location: [facility];  Service: Urology;  Laterality: Bilateral;   CYSTOSCOPY W/ RETROGRADES Bilateral 06/03/2022   Procedure: CYSTOSCOPY WITH RETROGRADE PYELOGRAM;  Surgeon: Sebastian Ache, MD;  Location: [facility];  Service: Urology;  Laterality: Bilateral;   LEFT AND RIGHT HEART CATHETERIZATION WITH CORONARY ANGIOGRAM N/A 06/23/2014   Procedure: LEFT AND RIGHT HEART CATHETERIZATION WITH CORONARY ANGIOGRAM;  Surgeon: Laurey Morale, MD;  Location: [facility] CATH LAB;  Service: Cardiovascular;  Laterality: N/A;   NECK SURGERY  03/23/09   per Dr. Ophelia Charter,  cervical fusion    PERICARDIOCENTESIS N/A 09/28/2021   Procedure: PERICARDIOCENTESIS;  Surgeon: Corky Crafts, MD;  Location: [facility] INVASIVE CV LAB;  Service: Cardiovascular;  Laterality: N/A;   REPLACEMENT ASCENDING AORTA N/A 09/16/2021   Procedure: REPLACEMENT ASCENDING AORTA WITH 30 X HEMASHIELD PLATINUM WOVEN DOUBLE VELOUR VASCULAR GRAFT;  Surgeon: Alleen Borne, MD;  Location: [facility] OR;  Service: Open Heart Surgery;  Laterality: N/A;  [facility] ARREST   right elbow surgery     RIGHT HEART CATH AND CORONARY ANGIOGRAPHY N/A 07/08/2021   Procedure: RIGHT HEART CATH AND CORONARY ANGIOGRAPHY;  Surgeon: Laurey Morale, MD;  Location: [facility] INVASIVE CV LAB;  Service: Cardiovascular;  Laterality: N/A;   solonscopy   05/23/08   per Dr. Celene Kras hemorrhoids only, repeat in 5 years   SUBXYPHOID PERICARDIAL WINDOW N/A 09/28/2021   Procedure: SUBXYPHOID PERICARDIAL WINDOW;  Surgeon: Alleen Borne, MD;  Location: [facility] OR;  Service: Thoracic;  Laterality: N/A;   TEE WITHOUT CARDIOVERSION N/A 06/23/2014   Procedure: TRANSESOPHAGEAL ECHOCARDIOGRAM (TEE);  Surgeon: Laurey Morale, MD;  Location: [facility] ENDOSCOPY;  Service: Cardiovascular;  Laterality: N/A;   TEE WITHOUT CARDIOVERSION N/A 01/21/2016   Procedure: TRANSESOPHAGEAL ECHOCARDIOGRAM (TEE);  Surgeon: Laurey Morale, MD;  Location: [facility] ENDOSCOPY;  Service: Cardiovascular;  Laterality: N/A;   TEE WITHOUT CARDIOVERSION N/A 09/16/2021   Procedure: TRANSESOPHAGEAL ECHOCARDIOGRAM (TEE);  Surgeon: Alleen Borne, MD;  Location: [facility] OR;  Service: Open Heart Surgery;  Laterality: N/A;   TEE WITHOUT CARDIOVERSION N/A 09/28/2021   Procedure: TRANSESOPHAGEAL ECHOCARDIOGRAM (TEE);  Surgeon: Alleen Borne, MD;  Location: [facility] OR;  Service: Thoracic;  Laterality: N/A;   TONSILLECTOMY     TRANSURETHRAL RESECTION OF BLADDER TUMOR N/A 10/21/2019   Procedure: TRANSURETHRAL RESECTION OF BLADDER TUMOR (TURBT);  Surgeon: Ihor Gully, MD;  Location: [facility];  Service: Urology;  Laterality: N/A;   TRANSURETHRAL RESECTION OF BLADDER TUMOR N/A 12/13/2019   Procedure: TRANSURETHRAL RESECTION OF BLADDER TUMOR (TURBT);  Surgeon: Sebastian Ache, MD;  Location: [facility];  Service: Urology;  Laterality: N/A;  1 HR   TRANSURETHRAL RESECTION OF BLADDER TUMOR N/A 10/14/2020   Procedure: TRANSURETHRAL RESECTION OF BLADDER TUMOR (TURBT);  Surgeon: Sebastian Ache, MD;  Location: [facility];  Service: Urology;  Laterality: N/A;   TRANSURETHRAL RESECTION OF BLADDER TUMOR N/A 12/16/2020   Procedure: RESTAGING TRANSURETHRAL RESECTION OF BLADDER TUMOR (TURBT);  Surgeon: Sebastian Ache, MD;  Location: [facility];  Service: Urology;   Laterality: N/A;   TRANSURETHRAL RESECTION OF BLADDER TUMOR N/A 06/03/2022   Procedure: TRANSURETHRAL RESECTION OF BLADDER TUMOR (TURBT);  Surgeon: Sebastian Ache, MD;  Location: [facility];  Service: Urology;  Laterality: N/A;    Social History   Socioeconomic History   Marital status: Married    Spouse name: Not on file   Number of children: 4   Years of education: Not on file   Highest education level: Not on file  Occupational History   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  Tobacco Use   Smoking status: Former    Current packs/day: 0.00    Average packs/day: 2.0 packs/day for 40.0 years (80.0 ttl pk-yrs)    Types: Cigarettes    Start date: 68    Quit date: 2012    Years since quitting: 12.5   Smokeless tobacco: Never  Vaping Use   Vaping status: Never Used  Substance and Sexual Activity   Alcohol use:  No    Alcohol/week: 0.0 standard drinks of alcohol   Drug use: No   Sexual activity: Not on file  Other Topics Concern   Not on file  Social History Narrative   Not on file   Social Determinants of Health   Financial Resource Strain: Low Risk  (11/03/2022)   Overall Financial Resource Strain (CARDIA)    Difficulty of Paying Living Expenses: Not hard at all  Food Insecurity: No Food Insecurity (11/03/2022)   Hunger Vital Sign    Worried About Running Out of Food in the Last Year: Never true    Ran Out of Food in the Last Year: Never true  Transportation Needs: No Transportation Needs (11/03/2022)   PRAPARE - Administrator, Civil Service (Medical): No    Lack of Transportation (Non-Medical): No  Physical Activity: Unknown (11/03/2022)   Exercise Vital Sign    Days of Exercise per Week: Patient unable to answer    Minutes of Exercise per Session: 30 min  Stress: No Stress Concern Present (11/03/2022)   [employer] of Occupational Health - Occupational Stress Questionnaire    Feeling of Stress : Not at all  Social  Connections: Moderately Isolated (11/03/2022)   Social Connection and Isolation Panel [NHANES]    Frequency of Communication with Friends and Family: More than three times a week    Frequency of Social Gatherings with Friends and Family: More than three times a week    Attends Religious Services: Never    Database administrator or Organizations: No    Attends Banker Meetings: Never    Marital Status: Married  Catering manager Violence: Not At Risk (11/03/2022)   Humiliation, Afraid, Rape, and Kick questionnaire    Fear of Current or Ex-Partner: No    Emotionally Abused: No    Physically Abused: No    Sexually Abused: No    Family History  Problem Relation Age of Onset   Lung cancer Mother        lung   Esophageal cancer Cousin    Colon cancer Neg Hx    Rectal cancer Neg Hx    Stomach cancer Neg Hx     Current Outpatient Medications on File Prior to Visit  Medication Sig Dispense Refill   acetaminophen (TYLENOL) 500 MG tablet Take 1-2 tablets (500-1,000 mg total) by mouth every 6 (six) hours as needed for moderate pain. (Patient taking differently: Take 1,000 mg by mouth every 6 (six) hours as needed for moderate pain.) 30 tablet 0   aspirin 81 MG tablet Take 81 mg by mouth every morning.     carvedilol (COREG) 3.125 MG tablet TAKE 1 TABLET BY MOUTH TWICE A DAY WITH A MEAL (Patient taking differently: Take 3.125 mg by mouth 2 (two) times daily with a meal.) 180 tablet 1   diazepam (VALIUM) 5 MG tablet Take 1 tablet (5 mg total) by mouth every 12 (twelve) hours as needed for anxiety. 60 tablet 2   digoxin (LANOXIN) 0.25 MG tablet Take 1 tablet by mouth daily.     empagliflozin (JARDIANCE) 10 MG TABS tablet Take 1 tablet (10 mg total) by mouth daily before breakfast. 30 tablet 11   Ferrous Sulfate (IRON PO) Take 1 tablet by mouth daily. 1 tab daily     furosemide (LASIX) 20 MG tablet Take 1 tablet (20 mg total) by mouth as directed. NEEDS FOLLOW UP APPOINTMENT FOR MORE  REFILLS 450 tablet 0   ibuprofen (ADVIL) 200  MG tablet Take by mouth.     ipratropium (ATROVENT) 0.03 % nasal spray Place 2 sprays into both nostrils every 12 (twelve) hours as needed for rhinitis.     KLOR-CON M20 20 MEQ tablet TAKE 2 TABLETS BY MOUTH EVERY DAY 180 tablet 1   Magnesium 500 MG TABS Take 500 mg by mouth daily.     meclizine (ANTIVERT) 25 MG tablet Take 1 tablet (25 mg total) by mouth 3 (three) times daily as needed for dizziness. 30 tablet 0   multivitamin-iron-minerals-folic acid (CENTRUM) chewable tablet Chew 1 tablet by mouth daily.     nitroGLYCERIN (NITROSTAT) 0.4 MG SL tablet Place under the tongue.     rosuvastatin (CRESTOR) 20 MG tablet TAKE 1 TABLET BY MOUTH EVERYDAY AT BEDTIME (Patient taking differently: Take 20 mg by mouth at bedtime.) 90 tablet 3   tamsulosin (FLOMAX) 0.4 MG CAPS capsule Take 0.4 mg by mouth daily.     traMADol (ULTRAM) 50 MG tablet Take 1 tablet (50 mg total) by mouth every 6 (six) hours as needed for moderate pain. 14 tablet 0   venlafaxine XR (EFFEXOR-XR) 150 MG 24 hr capsule TAKE 1 CAPSULE BY MOUTH DAILY WITH BREAKFAST. 90 capsule 0   phenazopyridine (PYRIDIUM) 200 MG tablet Take 200 mg by mouth every 8 (eight) hours as needed. AZO (Patient not taking: Reported on 11/03/2022)     No current facility-administered medications on file prior to visit.    No Known Allergies     Physical Exam There were no vitals filed for this visit. Estimated body mass index is 44.87 kg/m as calculated from the following:   Height as of 10/14/22: 5\' 6"  (1.676 m).   Weight as of this encounter: 278 lb (126.1 kg).  EKG (optional): deferred due to virtual visit  GENERAL: alert, oriented, no acute distress detected; full vision exam deferred due to pandemic and/or virtual encounter  PSYCH/NEURO: pleasant and cooperative, no obvious depression or anxiety, speech and thought processing grossly intact, Cognitive function grossly intact  Flowsheet Row Video Visit  from 11/03/2022 in [facility]  PHQ-9 Total Score 0           11/03/2022    3:58 PM 08/08/2022    2:42 PM 05/23/2022    1:59 PM 08/18/2021    9:40 AM 08/12/2020    8:55 AM  Depression screen PHQ 2/9  Decreased Interest 0 1 0 0 0  Down, Depressed, Hopeless 0 0 0 0 0  PHQ - 2 Score 0 1 0 0 0  Altered sleeping 0 3 1    Tired, decreased energy 0 2 1    Change in appetite 0 0 1    Feeling bad or failure about yourself  0 0 0    Trouble concentrating 0 0 0    Moving slowly or fidgety/restless 0 0 0    Suicidal thoughts 0 0 0    PHQ-9 Score 0 6 3    Difficult doing work/chores Not difficult at all Somewhat difficult Not difficult at all         10/02/2021    7:55 AM 10/02/2021    7:30 PM 05/23/2022    1:59 PM 08/08/2022    2:42 PM 11/03/2022    3:59 PM  Fall Risk  Falls in the past year?   0 0 0  Was there an injury with Fall?   0 0 0  Fall Risk Category Calculator   0 0 0  (  RETIRED) Patient Fall Risk Level Moderate fall risk Moderate fall risk     Patient at Risk for Falls Due to   No Fall Risks No Fall Risks No Fall Risks  Fall risk Follow up   Falls evaluation completed Falls evaluation completed Falls evaluation completed     SUMMARY AND PLAN:  Encounter for Medicare annual wellness exam   Discussed applicable health maintenance/preventive health measures and advised and referred or ordered per patient preferences: -he is considering doing vaccines at pharmacy  Health Maintenance  Topic Date Due   Diabetic kidney evaluation - Urine ACR  Never done   Hepatitis C Screening  Never done   DTaP/Tdap/Td (1 - Tdap) Never done   Zoster Vaccines- Shingrix (1 of 2) Never done   Pneumonia Vaccine 34+ Years old (1 of 1 - PCV) Never done   COVID-19 Vaccine (4 - 2023-24 season) 12/24/2021   Lung Cancer Screening  09/29/2022   INFLUENZA VACCINE  11/24/2022   Diabetic kidney evaluation - eGFR measurement  10/14/2023   Medicare Annual Wellness (AWV)   11/03/2023   Colonoscopy  10/03/2025   HPV VACCINES  Aged [employer] and counseling on the following was provided based on the above review of health and a plan/checklist for the patient, along with additional information discussed, was provided for the patient in the patient instructions :   -Provided safe balance exercises that can be done at home to improve balance and discussed exercise guidelines for adults with include balance exercises at least 3 days per week.  -Advised and counseled on a healthy lifestyle - including the importance of a healthy diet, regular physical activity, social connections and stress management. -Reviewed patient's current diet. Advised and counseled on a whole foods based healthy diet. A summary of a healthy diet was provided in the Patient Instructions.  -reviewed patient's current physical activity level and discussed exercise guidelines for adults. Discussed community resources and ideas for safe exercise at home to assist in meeting exercise guideline recommendations in a safe and healthy way. Discussed adding very gradually and with approval from his doctors.  -Advise yearly dental visits at minimum and regular eye exams   Follow up: see patient instructions   Patient Instructions  I really enjoyed getting to talk with you today! I am available on Tuesdays and Thursdays for virtual visits if you have any questions or concerns, or if I can be of any further assistance.   CHECKLIST FROM ANNUAL WELLNESS VISIT:  -Follow up (please call to schedule if not scheduled after visit):   -yearly for annual wellness visit with primary care office  Here is a list of your preventive care/health maintenance measures and the plan for each if any are due:  PLAN For any measures below that may be due:   Health Maintenance  Topic Date Due   Diabetic kidney evaluation - Urine ACR  Never done   Hepatitis C Screening  Never done   DTaP/Tdap/Td (1 - Tdap) Never  done   Zoster Vaccines- Shingrix (1 of 2) Never done   Pneumonia Vaccine 26+ Years old (1 of 1 - PCV) Never done   COVID-19 Vaccine (4 - 2023-24 season) 12/24/2021   Lung Cancer Screening  09/29/2022   INFLUENZA VACCINE  11/24/2022   Diabetic kidney evaluation - eGFR measurement  10/14/2023   Medicare Annual Wellness (AWV)  11/03/2023   Colonoscopy  10/03/2025   HPV VACCINES  Aged Out    -See a Education officer, community  at least yearly  -Get your eyes checked and then per your eye specialist's recommendations  -Other issues addressed today:   -I have included below further information regarding a healthy whole foods based diet, physical activity guidelines for adults, stress management and opportunities for social connections. I hope you find this information useful.   -----------------------------------------------------------------------------------------------------------------------------------------------------------------------------------------------------------------------------------------------------------  NUTRITION: -eat real food: lots of colorful vegetables (half the plate) and fruits -5-7 servings of vegetables and fruits per day (fresh or steamed is best), exp. 2 servings of vegetables with lunch and dinner and 2 servings of fruit per day. Berries and greens such as kale and collards are great choices.  -consume on a regular basis: whole grains (make sure first ingredient on label contains the word "whole"), fresh fruits, fish, nuts, seeds, healthy oils (such as olive oil, avocado oil, grape seed oil) -may eat small amounts of dairy and lean meat on occasion, but avoid processed meats such as ham, bacon, lunch meat, etc. -drink water -try to avoid fast food and pre-packaged foods, processed meat -most experts advise limiting sodium to < 2300mg  per day, should limit further is any chronic conditions such as high blood pressure, heart disease, diabetes, etc. The American Heart Association  advised that < 1500mg  is is ideal -try to avoid foods that contain any ingredients with names you do not recognize  -try to avoid sugar/sweets (except for the natural sugar that occurs in fresh fruit) -try to avoid sweet drinks -try to avoid white rice, white bread, pasta (unless whole grain), white or yellow potatoes  EXERCISE GUIDELINES FOR ADULTS: -if you wish to increase your physical activity, do so gradually and with the approval of your doctor -STOP and seek medical care immediately if you have any chest pain, chest discomfort or trouble breathing when starting or increasing exercise  -move and stretch your body, legs, feet and arms when sitting for long periods -Physical activity guidelines for optimal health in adults: -least 150 minutes per week of aerobic exercise (can talk, but not sing) once approved by your doctor, 20-30 minutes of sustained activity or two 10 minute episodes of sustained activity every day.  -resistance training at least 2 days per week if approved by your doctor -balance exercises 3+ days per week:   Stand somewhere where you have something sturdy to hold onto if you lose balance.    1) lift up on toes, start with 5x per day and work up to 20x   2) stand and lift on leg straight out to the side so that foot is a few inches of the floor, start with 5x each side and work up to 20x each side   3) stand on one foot, start with 5 seconds each side and work up to 20 seconds on each side  If you need ideas or help with getting more active:  -Silver sneakers https://tools.silversneakers.com  -Walk with a Doc: http://www.duncan-williams.com/  -try to include resistance (weight lifting/strength building) and balance exercises twice per week: or the following link for ideas: http://castillo-powell.com/  BuyDucts.dk  STRESS MANAGEMENT: -can try meditating, or just sitting quietly  with deep breathing while intentionally relaxing all parts of your body for 5 minutes daily -if you need further help with stress, anxiety or depression please follow up with your primary doctor or contact the wonderful folks at [facility]: [phone]  SOCIAL CONNECTIONS: -options in [place] if you wish to engage in more social and exercise related activities:  -Silver sneakers https://tools.silversneakers.com  -Walk with  a Doc: http://www.duncan-williams.com/  -Check out the [facility] Active Adults 50+ section on the [place] [employer] (hiking clubs, book clubs, cards and games, chess, exercise classes, aquatic classes and much more) - see the website for details: https://www.County [email or web address]  -YouTube has lots of exercise videos for different ages and abilities as well  -[facility] (a variety of indoor and outdoor inperson activities for adults). [phone]. [address].  -Virtual Online Classes (a variety of topics): see seniorplanet.org or call [phone]  -consider volunteering at a school, hospice center, church, senior center or elsewhere           Terressa Koyanagi, DO

## 2022-11-04 ENCOUNTER — Inpatient Hospital Stay: Payer: PPO | Attending: Hematology and Oncology

## 2022-11-04 ENCOUNTER — Inpatient Hospital Stay: Payer: PPO

## 2022-11-04 ENCOUNTER — Inpatient Hospital Stay (HOSPITAL_BASED_OUTPATIENT_CLINIC_OR_DEPARTMENT_OTHER): Payer: PPO | Admitting: Hematology and Oncology

## 2022-11-04 ENCOUNTER — Other Ambulatory Visit: Payer: Self-pay

## 2022-11-04 DIAGNOSIS — C679 Malignant neoplasm of bladder, unspecified: Secondary | ICD-10-CM

## 2022-11-04 DIAGNOSIS — Z79899 Other long term (current) drug therapy: Secondary | ICD-10-CM | POA: Insufficient documentation

## 2022-11-04 DIAGNOSIS — Z5112 Encounter for antineoplastic immunotherapy: Secondary | ICD-10-CM | POA: Diagnosis not present

## 2022-11-04 LAB — CBC WITH DIFFERENTIAL (CANCER CENTER ONLY)
Abs Immature Granulocytes: 0.02 10*3/uL (ref 0.00–0.07)
Basophils Absolute: 0 10*3/uL (ref 0.0–0.1)
Basophils Relative: 0 %
Eosinophils Absolute: 0.3 10*3/uL (ref 0.0–0.5)
Eosinophils Relative: 5 %
HCT: 43.9 % (ref 39.0–52.0)
Hemoglobin: 14.5 g/dL (ref 13.0–17.0)
Immature Granulocytes: 0 %
Lymphocytes Relative: 22 %
Lymphs Abs: 1.5 10*3/uL (ref 0.7–4.0)
MCH: 29.8 pg (ref 26.0–34.0)
MCHC: 33 g/dL (ref 30.0–36.0)
MCV: 90.3 fL (ref 80.0–100.0)
Monocytes Absolute: 0.5 10*3/uL (ref 0.1–1.0)
Monocytes Relative: 8 %
Neutro Abs: 4.4 10*3/uL (ref 1.7–7.7)
Neutrophils Relative %: 65 %
Platelet Count: 150 10*3/uL (ref 150–400)
RBC: 4.86 MIL/uL (ref 4.22–5.81)
RDW: 14.5 % (ref 11.5–15.5)
WBC Count: 6.8 10*3/uL (ref 4.0–10.5)
nRBC: 0 % (ref 0.0–0.2)

## 2022-11-04 LAB — CMP (CANCER CENTER ONLY)
ALT: 27 U/L (ref 0–44)
AST: 23 U/L (ref 15–41)
Albumin: 4.2 g/dL (ref 3.5–5.0)
Alkaline Phosphatase: 85 U/L (ref 38–126)
Anion gap: 8 (ref 5–15)
BUN: 21 mg/dL (ref 8–23)
CO2: 26 mmol/L (ref 22–32)
Calcium: 9.1 mg/dL (ref 8.9–10.3)
Chloride: 104 mmol/L (ref 98–111)
Creatinine: 1.1 mg/dL (ref 0.61–1.24)
GFR, Estimated: >60 mL/min (ref >60)
Glucose, Bld: 142 mg/dL — ABNORMAL HIGH (ref 70–99)
Potassium: 3.7 mmol/L (ref 3.5–5.1)
Sodium: 138 mmol/L (ref 135–145)
Total Bilirubin: 0.8 mg/dL (ref 0.3–1.2)
Total Protein: 7.4 g/dL (ref 6.5–8.1)

## 2022-11-04 MED ORDER — SODIUM CHLORIDE 0.9 % IV SOLN
200.0000 mg | Freq: Once | INTRAVENOUS | Status: AC
  Administered 2022-11-04: 200 mg via INTRAVENOUS
  Filled 2022-11-04: qty 200

## 2022-11-04 MED ORDER — SODIUM CHLORIDE 0.9 % IV SOLN: Freq: Once | INTRAVENOUS | Status: AC

## 2022-11-04 NOTE — Progress Notes (Signed)
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 [name]  08/12/2022: Cycle 1 of Pembrolizumab 09/02/2022: Cycle 2 of Pembrolizumab  09/23/2022: Cycle 3 of Pembrolizumab 10/14/2022: Cycle 4 of Pembrolizumab 11/04/2022: Cycle 5 of Pembrolizumab  CHIEF COMPLAINTS/PURPOSE OF CONSULTATION:  "High Risk Non-Muscle Invasive Bladder Cancer "  HISTORY OF PRESENTING ILLNESS:  Luis [name] 67 y.o. male with medical history significant for BCG unresponsive high risk nonmuscle invasive bladder cancer who presents for a follow up before starting Cycle 5 of Pembrolizumab therapy. He is accompanied by his wife for this visit.   On exam today on exam today Luis [name] reports he has been well overall in the interim since her last visit.  He did stay inside for the Fourth of July due to the heat.  He did go outside yesterday to take care of the weeds and mow the yard.  He reports that he is tolerating his Keytruda well without any major side effects.  He does have a small rash on the left side of his abdomen which is itchy.  It takes up the surface area about a circle with 3 inch  diameter.  He reports he does have some weakness and tiredness on Saturday after his infusions but he does improve by Sunday.  He is not having any infectious symptoms such as runny nose, sore throat, or cough.  He does have some blood off and on in the urine.  He will be visiting with urology again in August 2024.  He notes he is eating well and otherwise has no limitations in his day-to-day activity.  He is not having any nausea, vomiting, or diarrhea.. He denies fevers, chills, sweats, shortness of breath, chest pain, cough, rash or peripheral edema. He has no other complaints. A full 10 point ROS is otherwise negative.  MEDICAL HISTORY:  Past Medical History:  Diagnosis Date   Anxiety    takes Valium as needed   Aortic stenosis, moderate    Arthritis    Ascending aortic aneurysm (HCC)    CAD (coronary artery disease)    a. s/p PCI of the RCA 8/12 with DES by Dr Excell Seltzer, preserved EF. b. LHC/RHC (2/16) with mean RA 12, PA 32/15, mean PCWP 18, CI 3.47; patent mid and distal RCA stents, 50-60% proximal stenosis small PDA.      Cancer [facility])    bladder   Carotid stenosis    a. Carotid US (05/2013):  Bilateral 1-39% ICA; L thyroid nodule (prior hx of aspiration).   Chronic diastolic CHF (congestive heart failure) (HCC)    Complication of anesthesia    difficulty waking up after gallbladder surgery   Depression    Dyspnea    Essential hypertension    GERD (gastroesophageal reflux disease)    if needed  will take OTC meds    Heart murmur    History of colonic polyps    hyperplastic   Hyperlipidemia    Joint pain    Lesion of bladder    Myocardial infarction (HCC) 2012   Obesity (BMI 30-39.9) 02/29/2016   Pre-diabetes    Restless leg    Sleep apnea    uses cpap   Tubular adenoma of colon    Vertigo    takes Meclizine as needed    SURGICAL HISTORY: Past Surgical History:  Procedure Laterality Date   AORTIC VALVE REPLACEMENT N/A 09/16/2021   Procedure: AORTIC VALVE REPLACEMENT  (AVR);  Surgeon: Alleen Borne, MD;  Location: [facility] OR;  Service: Open Heart Surgery;  Laterality: N/A;   CATARACT EXTRACTION   4 YRS AGO   BOTH EYES   CHOLECYSTECTOMY  07/21/2011   Procedure: LAPAROSCOPIC CHOLECYSTECTOMY WITH INTRAOPERATIVE CHOLANGIOGRAM;  Surgeon: Kandis Cocking, MD;  Location: [facility] ORS;  Service: General;  Laterality: N/A;   CORONARY ANGIOPLASTY  2012   2 stents   coronary stenting     s/p PCI of the RCA by Dr Excell Seltzer 8/12 with 2 promus stents   CYSTOSCOPY W/ RETROGRADES Bilateral 12/13/2019   Procedure: CYSTOSCOPY WITH RETROGRADE PYELOGRAM;  Surgeon: Sebastian Ache, MD;  Location: [facility];  Service: Urology;  Laterality: Bilateral;   CYSTOSCOPY W/ RETROGRADES Bilateral 10/14/2020   Procedure: CYSTOSCOPY WITH RETROGRADE PYELOGRAM;  Surgeon: Sebastian Ache, MD;  Location: [facility];  Service: Urology;  Laterality: Bilateral;   CYSTOSCOPY W/ RETROGRADES Bilateral 12/16/2020   Procedure: CYSTOSCOPY WITH RETROGRADE PYELOGRAM;  Surgeon: Sebastian Ache, MD;  Location: [facility];  Service: Urology;  Laterality: Bilateral;   CYSTOSCOPY W/ RETROGRADES Bilateral 06/03/2022   Procedure: CYSTOSCOPY WITH RETROGRADE PYELOGRAM;  Surgeon: Sebastian Ache, MD;  Location: [facility];  Service: Urology;  Laterality: Bilateral;   LEFT AND RIGHT HEART CATHETERIZATION WITH CORONARY ANGIOGRAM N/A 06/23/2014   Procedure: LEFT AND RIGHT HEART CATHETERIZATION WITH CORONARY ANGIOGRAM;  Surgeon: Laurey Morale, MD;  Location: [facility] C[facility] LAB;  Service: Cardiovascular;  Laterality: N/A;   NECK SURGERY  03/23/09   per Dr. Ophelia Charter, cervical fusion    PERICARDIOCENTESIS N/A 09/28/2021   Procedure: PERICARDIOCENTESIS;  Surgeon: Corky Crafts, MD;  Location: [facility] INVASIVE CV LAB;  Service: Cardiovascular;  Laterality: N/A;   REPLACEMENT ASCENDING AORTA N/A 09/16/2021   Procedure: REPLACEMENT ASCENDING AORTA WITH 30 X HEMASHIELD PLATINUM WOVEN DOUBLE VELOUR  VASCULAR GRAFT;  Surgeon: Alleen Borne, MD;  Location: [facility] OR;  Service: Open Heart Surgery;  Laterality: N/A;  CIRC ARREST   right elbow surgery     RIGHT HEART CATH AND CORONARY ANGIOGRAPHY N/A 07/08/2021   Procedure: RIGHT HEART CATH AND CORONARY ANGIOGRAPHY;  Surgeon: Laurey Morale, MD;  Location: [facility] INVASIVE CV LAB;  Service: Cardiovascular;  Laterality: N/A;   solonscopy  05/23/08   per Dr. Celene Kras hemorrhoids only, repeat in 5 years   SUBXYPHOID PERICARDIAL WINDOW N/A 09/28/2021   Procedure: SUBXYPHOID PERICARDIAL WINDOW;  Surgeon: Alleen Borne, MD;  Location: [facility] OR;  Service: Thoracic;  Laterality: N/A;   TEE WITHOUT CARDIOVERSION N/A 06/23/2014   Procedure: TRANSESOPHAGEAL ECHOCARDIOGRAM (TEE);  Surgeon: Laurey Morale, MD;  Location: [facility] ENDOSCOPY;  Service: Cardiovascular;  Laterality: N/A;   TEE WITHOUT CARDIOVERSION N/A 01/21/2016   Procedure: TRANSESOPHAGEAL ECHOCARDIOGRAM (TEE);  Surgeon: Laurey Morale, MD;  Location: [facility] ENDOSCOPY;  Service: Cardiovascular;  Laterality: N/A;   TEE WITHOUT  CARDIOVERSION N/A 09/16/2021   Procedure: TRANSESOPHAGEAL ECHOCARDIOGRAM (TEE);  Surgeon: Alleen Borne, MD;  Location: [facility] OR;  Service: Open Heart Surgery;  Laterality: N/A;   TEE WITHOUT CARDIOVERSION N/A 09/28/2021   Procedure: TRANSESOPHAGEAL ECHOCARDIOGRAM (TEE);  Surgeon: Alleen Borne, MD;  Location: [facility] OR;  Service: Thoracic;  Laterality: N/A;   TONSILLECTOMY     TRANSURETHRAL RESECTION OF BLADDER TUMOR N/A 10/21/2019   Procedure: TRANSURETHRAL RESECTION OF BLADDER TUMOR (TURBT);  Surgeon: Ihor Gully, MD;  Location: [facility];  Service: Urology;  Laterality: N/A;   TRANSURETHRAL RESECTION OF BLADDER TUMOR N/A 12/13/2019   Procedure: TRANSURETHRAL RESECTION OF BLADDER TUMOR (TURBT);  Surgeon: Sebastian Ache, MD;  Location: [facility];  Service: Urology;  Laterality: N/A;  1 HR   TRANSURETHRAL RESECTION OF BLADDER TUMOR N/A 10/14/2020    Procedure: TRANSURETHRAL RESECTION OF BLADDER TUMOR (TURBT);  Surgeon: Sebastian Ache, MD;  Location: [facility];  Service: Urology;  Laterality: N/A;   TRANSURETHRAL RESECTION OF BLADDER TUMOR N/A 12/16/2020   Procedure: RESTAGING TRANSURETHRAL RESECTION OF BLADDER TUMOR (TURBT);  Surgeon: Sebastian Ache, MD;  Location: [facility];  Service: Urology;  Laterality: N/A;   TRANSURETHRAL RESECTION OF BLADDER TUMOR N/A 06/03/2022   Procedure: TRANSURETHRAL RESECTION OF BLADDER TUMOR (TURBT);  Surgeon: Sebastian Ache, MD;  Location: [facility]S;  Service: Urology;  Laterality: N/A;    SOCIAL HISTORY: Social History   Socioeconomic History   Marital status: Married    Spouse name: Not on file   Number of children: 4   Years of education: Not on file   Highest education level: Not on file  Occupational History   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  Tobacco Use   Smoking status: Former    Current packs/day: 0.00    Average packs/day: 2.0 packs/day for 40.0 years (80.0 ttl pk-yrs)    Types: Cigarettes    Start date: 5    Quit date: 2012    Years since quitting: 12.5   Smokeless tobacco: Never  Vaping Use   Vaping status: Never Used  Substance and Sexual Activity   Alcohol use: No    Alcohol/week: 0.0 standard drinks of alcohol   Drug use: No   Sexual activity: Not on file  Other Topics Concern   Not on file  Social History Narrative   Not on file   Social Determinants of Health   Financial Resource Strain: Low Risk  (11/03/2022)   Overall Financial Resource Strain (CARDIA)    Difficulty of Paying Living Expenses: Not hard at all  Food Insecurity: No Food Insecurity (11/03/2022)   Hunger Vital Sign    Worried About Running Out of Food in the Last Year: Never true    Ran Out of Food in the Last Year: Never true  Transportation Needs: No Transportation Needs (11/03/2022)   PRAPARE - Scientist, research (physical sciences) (Medical): No    Lack of Transportation (Non-Medical): No  Physical Activity: Unknown (11/03/2022)   Exercise Vital Sign    Days of Exercise per Week: Patient unable to answer    Minutes of Exercise per Session: 30 min  Stress: No Stress Concern Present (11/03/2022)   [employer] of Occupational Health - Occupational Stress Questionnaire    Feeling of Stress : Not at all  Social Connections: Moderately Isolated (11/03/2022)   Social Connection and Isolation Panel [NHANES]    Frequency of Communication with Friends  and Family: More than three times a week    Frequency of Social Gatherings with Friends and Family: More than three times a week    Attends Religious Services: Never    Database administrator or Organizations: No    Attends Banker Meetings: Never    Marital Status: Married  Catering manager Violence: Not At Risk (11/03/2022)   Humiliation, Afraid, Rape, and Kick questionnaire    Fear of Current or Ex-Partner: No    Emotionally Abused: No    Physically Abused: No    Sexually Abused: No    FAMILY HISTORY: Family History  Problem Relation Age of Onset   Lung cancer Mother        lung   Esophageal cancer Cousin    Colon cancer Neg Hx    Rectal cancer Neg Hx    Stomach cancer Neg Hx     ALLERGIES:  has No Known Allergies.  MEDICATIONS:  Current Outpatient Medications  Medication Sig Dispense Refill   acetaminophen (TYLENOL) 500 MG tablet Take 1-2 tablets (500-1,000 mg total) by mouth every 6 (six) hours as needed for moderate pain. (Patient taking differently: Take 1,000 mg by mouth every 6 (six) hours as needed for moderate pain.) 30 tablet 0   aspirin 81 MG tablet Take 81 mg by mouth every morning.     carvedilol (COREG) 3.125 MG tablet TAKE 1 TABLET BY MOUTH TWICE A DAY WITH A MEAL (Patient taking differently: Take 3.125 mg by mouth 2 (two) times daily with a meal.) 180 tablet 1   diazepam (VALIUM) 5 MG tablet Take 1 tablet (5 mg  total) by mouth every 12 (twelve) hours as needed for anxiety. 60 tablet 2   digoxin (LANOXIN) 0.25 MG tablet Take 1 tablet by mouth daily.     empagliflozin (JARDIANCE) 10 MG TABS tablet Take 1 tablet (10 mg total) by mouth daily before breakfast. 30 tablet 11   Ferrous Sulfate (IRON PO) Take 1 tablet by mouth daily. 1 tab daily     furosemide (LASIX) 20 MG tablet Take 1 tablet (20 mg total) by mouth as directed. NEEDS FOLLOW UP APPOINTMENT FOR MORE REFILLS 450 tablet 0   ibuprofen (ADVIL) 200 MG tablet Take by mouth.     ipratropium (ATROVENT) 0.03 % nasal spray Place 2 sprays into both nostrils every 12 (twelve) hours as needed for rhinitis.     KLOR-CON M20 20 MEQ tablet TAKE 2 TABLETS BY MOUTH EVERY DAY 180 tablet 1   Magnesium 500 MG TABS Take 500 mg by mouth daily.     meclizine (ANTIVERT) 25 MG tablet Take 1 tablet (25 mg total) by mouth 3 (three) times daily as needed for dizziness. 30 tablet 0   multivitamin-iron-minerals-folic acid (CENTRUM) chewable tablet Chew 1 tablet by mouth daily.     nitroGLYCERIN (NITROSTAT) 0.4 MG SL tablet Place under the tongue.     phenazopyridine (PYRIDIUM) 200 MG tablet Take 200 mg by mouth every 8 (eight) hours as needed. AZO (Patient not taking: Reported on 11/03/2022)     rosuvastatin (CRESTOR) 20 MG tablet TAKE 1 TABLET BY MOUTH EVERYDAY AT BEDTIME (Patient taking differently: Take 20 mg by mouth at bedtime.) 90 tablet 3   tamsulosin (FLOMAX) 0.4 MG CAPS capsule Take 0.4 mg by mouth daily.     traMADol (ULTRAM) 50 MG tablet Take 1 tablet (50 mg total) by mouth every 6 (six) hours as needed for moderate pain. 14 tablet 0   venlafaxine XR (  EFFEXOR-XR) 150 MG 24 hr capsule TAKE 1 CAPSULE BY MOUTH DAILY WITH BREAKFAST. 90 capsule 0   No current facility-administered medications for this visit.    REVIEW OF SYSTEMS:   Constitutional: ( - ) fevers, ( - )  chills , ( - ) night sweats Eyes: ( - ) blurriness of vision, ( - ) double vision, ( - ) watery  eyes Ears, nose, mouth, throat, and face: ( - ) mucositis, ( - ) sore throat Respiratory: ( - ) cough, ( - ) dyspnea, ( - ) wheezes Cardiovascular: ( - ) palpitation, ( - ) chest discomfort, ( - ) lower extremity swelling Gastrointestinal:  ( - ) nausea, ( - ) heartburn, ( - ) change in bowel habits Skin: ( - ) abnormal skin rashes Lymphatics: ( - ) new lymphadenopathy, ( - ) easy bruising Neurological: ( - ) numbness, ( - ) tingling, ( - ) new weaknesses Behavioral/Psych: ( - ) mood change, ( - ) new changes  All other systems were reviewed with the patient and are negative.  PHYSICAL EXAMINATION: ECOG PERFORMANCE STATUS: 1 - Symptomatic but completely ambulatory  Vitals:   11/04/22 0833  BP: 126/69  Pulse: 65  Resp: 17  Temp: 98.5 F (36.9 C)  SpO2: 95%      Filed Weights   11/04/22 0833  Weight: 282 lb 6.4 oz (128.1 kg)       GENERAL: well appearing elderly Caucasian male in NAD  SKIN: skin color, texture, turgor are normal, no rashes or significant lesions EYES: conjunctiva are pink and non-injected, sclera clear LUNGS: clear to auscultation and percussion with normal breathing effort HEART: regular rate & rhythm and no murmurs and no lower extremity edema Musculoskeletal: no cyanosis of digits and no clubbing  PSYCH: alert & oriented x 3, fluent speech NEURO: no focal motor/sensory deficits  LABORATORY DATA:  I have reviewed the data as listed    Latest Ref Rng & Units 11/04/2022    7:54 AM 10/14/2022    9:29 AM 09/23/2022   12:03 PM  CBC  WBC 4.0 - 10.5 K/uL 6.8  6.6  6.4   Hemoglobin 13.0 - 17.0 g/dL 09.8  11.9  14.7   Hematocrit 39.0 - 52.0 % 43.9  43.8  42.4   Platelets 150 - 400 K/uL 150  144  151        Latest Ref Rng & Units 11/04/2022    7:54 AM 10/14/2022    9:29 AM 09/23/2022   12:03 PM  CMP  Glucose 70 - 99 mg/dL 829  562  130   BUN 8 - 23 mg/dL 21  21  22    Creatinine 0.61 - 1.24 mg/dL 8.65  7.84  6.96   Sodium 135 - 145 mmol/L 138  139   140   Potassium 3.5 - 5.1 mmol/L 3.7  3.7  4.0   Chloride 98 - 111 mmol/L 104  104  104   CO2 22 - 32 mmol/L 26  27  28    Calcium 8.9 - 10.3 mg/dL 9.1  8.9  8.8   Total Protein 6.5 - 8.1 g/dL 7.4  6.9  7.2   Total Bilirubin 0.3 - 1.2 mg/dL 0.8  0.9  0.7   Alkaline Phos 38 - 126 U/L 85  83  85   AST 15 - 41 U/L 23  26  24    ALT 0 - 44 U/L 27  27  25      RADIOGRAPHIC STUDIES:  No results found.  ASSESSMENT & PLAN Luis PERNICIARO 68 y.o. 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  After review of the labs, review of the records, and discussion with the patient the patients findings are most consistent with BCG unresponsive high risk non-muscle invasive bladder cancer, not a candidate for cystectomy.  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 21 days until progression or intolerance up to 24 months. --Started Cycle 1 of Pembrolizumab on 08/12/2022.  PLAN: --Due to start Cycle 5 of Pembrolizumab therapy today --Labs from today were reviewed and adequate for treatment. WBC 6.8, Hgb 14.5, MCV 90.3, Plt 150. Creatinine and LFTs normal.  --Proceed with treatment today without any modifications --CT scans are not able to monitor for progression of his cancer.  Cystoscopies are the preferred monitoring strategy for his cancer. --RTC in 3 weeks with labs and follow up visit before Cycle 6  # Abdominal Rash -- Small circular rash, approximately 3 inches in diameter. -- Patient has access to hydrocortisone 5% at home, recommend he apply that to the lesion 1-2 times daily to see if there is improvement.  #Urinary frequency and hematuria: -- Defer to urology for further evaluation and management  #Supportive Care -- chemotherapy education to be scheduled  -- port placement not required   -- no pain medication required at this time.   No orders of the defined types were placed in this encounter.  All questions were answered. The patient knows to call the clinic with any problems, questions or concerns.  I have spent a total of 30 minutes minutes of face-to-face and non-face-to-face time, preparing to see the patient, performing a medically appropriate examination, counseling and educating the patient, documenting clinical information in the electronic health record,and care coordination.   Ulysees Barns, MD Department of Hematology/Oncology [facility] Phone: [phone] Pager: [phone] Email: [email or web address]  11/04/2022 8:49 AM  Literature Support:  Pembrolizumab monotherapy for the treatment of high-risk non-muscle-invasive bladder cancer unresponsive to BCG ([id number]): an open-label, single-arm, multicentre, phase 2 study,The Lancet Oncology,Volume 22, Issue 7,2021,Pages 919-930,ISSN 2956-2130  -- Pembrolizumab monotherapy was tolerable and showed promising antitumour activity in patients with BCG-unresponsive non-muscle-invasive bladder cancer who declined or were ineligible for radical cystectomy and should be considered a a clinically active non-surgical treatment option in this difficult-to-treat population.  --All enrolled patients were assigned to receive pembrolizumab 200 mg intravenously every 3 weeks for up to 24 months or until centrally confirmed disease persistence, recurrence, or progression; unacceptable toxic effects

## 2022-11-08 ENCOUNTER — Other Ambulatory Visit: Payer: Self-pay

## 2022-11-10 ENCOUNTER — Other Ambulatory Visit: Payer: Self-pay | Admitting: Family Medicine

## 2022-11-10 DIAGNOSIS — F411 Generalized anxiety disorder: Secondary | ICD-10-CM

## 2022-11-14 ENCOUNTER — Ambulatory Visit (HOSPITAL_COMMUNITY)
Admission: RE | Admit: 2022-11-14 | Discharge: 2022-11-14 | Disposition: A | Discharge status: Home / Self Care | Payer: PPO | Source: Ambulatory Visit | Attending: Cardiology | Admitting: Cardiology

## 2022-11-14 ENCOUNTER — Encounter (HOSPITAL_COMMUNITY): Payer: Self-pay | Admitting: Cardiology

## 2022-11-14 VITALS — BP 110/70 | HR 57 | Wt 285.0 lb

## 2022-11-14 DIAGNOSIS — Z953 Presence of xenogenic heart valve: Secondary | ICD-10-CM | POA: Diagnosis not present

## 2022-11-14 DIAGNOSIS — R519 Headache, unspecified: Secondary | ICD-10-CM | POA: Diagnosis not present

## 2022-11-14 DIAGNOSIS — E785 Hyperlipidemia, unspecified: Secondary | ICD-10-CM | POA: Diagnosis not present

## 2022-11-14 DIAGNOSIS — Z79899 Other long term (current) drug therapy: Secondary | ICD-10-CM | POA: Insufficient documentation

## 2022-11-14 DIAGNOSIS — I25119 Atherosclerotic heart disease of native coronary artery with unspecified angina pectoris: Secondary | ICD-10-CM | POA: Diagnosis not present

## 2022-11-14 DIAGNOSIS — C679 Malignant neoplasm of bladder, unspecified: Secondary | ICD-10-CM | POA: Insufficient documentation

## 2022-11-14 DIAGNOSIS — I5032 Chronic diastolic (congestive) heart failure: Secondary | ICD-10-CM | POA: Diagnosis not present

## 2022-11-14 DIAGNOSIS — G4733 Obstructive sleep apnea (adult) (pediatric): Secondary | ICD-10-CM | POA: Insufficient documentation

## 2022-11-14 DIAGNOSIS — I110 Hypertensive heart disease with heart failure: Secondary | ICD-10-CM | POA: Insufficient documentation

## 2022-11-14 DIAGNOSIS — I350 Nonrheumatic aortic (valve) stenosis: Secondary | ICD-10-CM | POA: Diagnosis not present

## 2022-11-14 DIAGNOSIS — Z7984 Long term (current) use of oral hypoglycemic drugs: Secondary | ICD-10-CM | POA: Diagnosis not present

## 2022-11-14 DIAGNOSIS — I7121 Aneurysm of the ascending aorta, without rupture: Secondary | ICD-10-CM | POA: Insufficient documentation

## 2022-11-14 DIAGNOSIS — I3139 Other pericardial effusion (noninflammatory): Secondary | ICD-10-CM | POA: Diagnosis not present

## 2022-11-14 DIAGNOSIS — I480 Paroxysmal atrial fibrillation: Secondary | ICD-10-CM | POA: Insufficient documentation

## 2022-11-14 DIAGNOSIS — Z955 Presence of coronary angioplasty implant and graft: Secondary | ICD-10-CM | POA: Insufficient documentation

## 2022-11-14 MED ORDER — FUROSEMIDE 20 MG PO TABS: ORAL_TABLET | ORAL | 3 refills | Status: DC

## 2022-11-14 NOTE — Patient Instructions (Signed)
Medication Changes:  STOP: DIGOXIN- MAKE SURE YOU ARE NOT TAKING THIS   AVOID TAKING IBUPROFEN- PLEASE USE TYLENOL AS NEEDED INSTEAD  Lab Work:  Labs done today, your results will be available in MyChart, we will contact you for abnormal readings.  Testing/Procedures:  Your physician has requested that you have an echocardiogram. Echocardiography is a painless test that uses sound waves to create images of your heart. It provides your doctor with information about the size and shape of your heart and how well your heart's chambers and valves are working. You may receive an ultrasound enhancing agent through an IV if needed to better visualize your heart during the echo.This procedure takes approximately one hour. There are no restrictions for this procedure.   Follow-Up in: 4 MONTHS AS SCHEDULED WITH APP   At the [facility], you and your health needs are our priority. We have a designated team specialized in the treatment of Heart Failure. This Care Team includes your primary Heart Failure Specialized Cardiologist (physician), Advanced Practice Providers (APPs- Physician Assistants and Nurse Practitioners), and Pharmacist who all work together to provide you with the care you need, when you need it.   You may see any of the following providers on your designated Care Team at your next follow up:  Dr. Arvilla Meres Dr. Marca Ancona Dr. Marcos Eke, NP Robbie Lis, [place] [facility] [place], [place] Brynda Peon, NP Karle Plumber, PharmD  Please be sure to bring in all your medications bottles to every appointment.   Need to Contact us:  If you have any questions or concerns before your next appointment please send [place] a message through [place] or call our office at [phone].    TO LEAVE A MESSAGE FOR THE NURSE SELECT OPTION 2, PLEASE LEAVE A MESSAGE INCLUDING: YOUR NAME DATE OF BIRTH CALL BACK NUMBER REASON FOR CALL**this is  important as we prioritize the call backs  YOU WILL RECEIVE A CALL BACK THE SAME DAY AS LONG AS YOU CALL BEFORE 4:00 PM

## 2022-11-15 ENCOUNTER — Ambulatory Visit (HOSPITAL_COMMUNITY)
Admission: RE | Admit: 2022-11-15 | Discharge: 2022-11-15 | Disposition: A | Discharge status: Home / Self Care | Payer: PPO | Source: Ambulatory Visit | Attending: Cardiology | Admitting: Cardiology

## 2022-11-15 ENCOUNTER — Other Ambulatory Visit (HOSPITAL_COMMUNITY): Payer: Self-pay

## 2022-11-15 DIAGNOSIS — I5032 Chronic diastolic (congestive) heart failure: Secondary | ICD-10-CM | POA: Insufficient documentation

## 2022-11-15 LAB — BASIC METABOLIC PANEL
Anion gap: 11 (ref 5–15)
BUN: 21 mg/dL (ref 8–23)
CO2: 24 mmol/L (ref 22–32)
Calcium: 8.8 mg/dL — ABNORMAL LOW (ref 8.9–10.3)
Chloride: 102 mmol/L (ref 98–111)
Creatinine, Ser: 0.99 mg/dL (ref 0.61–1.24)
GFR, Estimated: >60 mL/min (ref >60)
Glucose, Bld: 147 mg/dL — ABNORMAL HIGH (ref 70–99)
Potassium: 3.8 mmol/L (ref 3.5–5.1)
Sodium: 137 mmol/L (ref 135–145)

## 2022-11-15 LAB — BRAIN NATRIURETIC PEPTIDE: B Natriuretic Peptide: 44.9 pg/mL (ref 0.0–100.0)

## 2022-11-15 NOTE — Progress Notes (Signed)
Patient ID: Luis [name], male   DOB: 06/11/1955, 67 y.o.   MRN: [id number] PCP: Dr. Clent Ridges Cardiology: Dr. Shirlee Latch  67 y.o. with history of CAD and moderate aortic stenosis presents for follow of AS and CAD. He had RCA PCI in 8/12.     When I saw him in early 2016, he reported significant exertional dyspnea.  I had him do a Lexiscan Cardiolite in 2/16.  This showed no ischemia or infarction.  I also had him get an echo that showed ?bicuspid aortic valve and probably moderate aortic stenosis.  Given ongoing symptoms, I had him do a RHC/LHC and TEE in 2/16.  The RHC showed mildly elevated left and right heart filling pressures and the LHC showed nonobstructive CAD.  TEE confirmed bicuspid aortic valve with moderate AS.  He had PFTs in 4/16 that were within normal limits.  I started him on Lasix 20 mg daily.  He seemed to improve.    Progressive dypnea in fall 2017.  RHC/LHC in 2017 showed mildly elevated left heart filling pressure and nonobstructive CAD.  TEE showed moderate aortic stenosis, bicuspid valve.  Stable ascending aorta dilation.   Echo in 9/18 showed EF 60-65% with bicuspid aortic valve and moderate AS.  CTA chest showed 4.5 cm ascending aorta.  Repeat CTA chest in 8/19 showed stable 4.5 cm ascending aorta.  Echo in 8/20 showed EF 60-65%, moderate AS, 4.5 cm ascending aorta. CTA chest in 8/20 with 4.6 cm ascending aorta. Echo in 1/22 showed EF 60-65%, mild LVH, moderate AS with mean gradient 32 and AVA 1.6 cm^2, bicuspid aortic valve, ascending aorta 46 mm.   He is being treated for bladder cancer with chemotherapy and TURBT.   CTA chest in 8/22 with 4.4 cm ascending aorta.   Echo 3/23 showed EF 60-65% with mild LVH, normal RV, bicuspid aortic valve with severe AS and mean gradient 48 mmHg, AVA 0.91 cm^2.    Follow up 3/23, continued with exertional dyspnea. Referred to [facility] for evaluation of AVR, arranged for [facility] as part of work up. R/LHC (3/23) showed mild CAD, patent RCA stents,  normal PCWP, mildly elevated RA pressure, preserved CO.  Underwent AV replacement and replacement of ascending aorta with Dr. Laneta Simmers 5/23. Had post-op atrial fibrillation, started on amiodarone/warfarin. Presented back to ED 1 week after discharge with LOC. Echo showed moderate pericardial effusion with maximal diameter of 3 cm over the right ventricle with some compression, LV and aortic valve function normal. Attempted pericardiocentesis in cath lab, but unsuccessful and placed sub-xiphoid pericardial window in OR, with 850 cc fluid removed. Discharged home, weight 275 lbs.  Post-op echo in 7/23 showed EF 60-65%, normal RV, stable bioprosthetic aortic valve.   He had hematuria and found to have recurrence of bladder cancer, now getting pembrolizumab infusions.   Today he returns for HF follow up. Generally no dyspnea with usual activities.  He does get some congestion at times that he attributes to sinuses.  No orthopnea/PND. No chest pain. Weight is up about 8 lbs. He is taking Lasix 60 qam/40 qpm.  He is taking ibuprofen frequently for aches and pains.   ECG (personally reviewed): NSR, RBBB  Labs (2/15): K 4.4, creatinine 0.9, LDL 71, HDL 53 Labs (2/16): K 4.8, creatinine 1.0, LDL 62, HDL 57 Labs (8/16): K 4.7, creatinine 0.95, TSH normal, HCT 41.2, LDL 78, HDL 58 Labs (8/17): K 4.3, creatinine 1.05, HCT 40.1, TSH normal, BNP 56 Labs (9/17): creatinine 1.08, BNP 56 Labs (7/18):  creatinine 1.2, LDL 53 Labs (7/19): LDL 68 Labs (8/19): K 5, creatinine 1.09 Labs (8/20): K 4.9, creatinine 1.09, LDL 59, HDL 50 Labs (8/21): K 4.6, creatinine 1.0 Labs (1/22): K 4, creatinine 1.02, LDL 70 Labs (9/22): BNP 18, K 3.8, creatinine 0.94 Labs (3/23): LDL 61, HDL 48 Labs (6/23): K 4.1, creatinine 1.0, hgb 8.3, Lp(a) 116 Labs (10/23): K 4.0, creatinine 1.34, LDL 71 Labs (7/24): K 3.7, creatinine 1.10, hgb 14.5  PMH: 1. OSA: Using CPAP.  2. CAD: Cardiolite 8/12 with inferior infarct and peri-infarct  ischemia, had PCI to the mid and distal RCA with Promus DES x 2.  Lexiscan Cardiolite (2/16) with no ischemia or infarction. LHC/RHC (2/16) with mean RA 12, PA 32/15, mean PCWP 18, CI 3.47; patent mid and distal RCA stents, 50-60% proximal stenosis small PDA.   - LHC/RHC (9/17): 60% proximal PDA; mean RA 2, PA 24/8, mean PCWP 18, CI 2.13.  - Cardiolite (8/17): EF 56%, normal.  - R/LHC (3/23, AVR work up): mid RCA lesion 40% stenosis, previously placed prox RCA to Mid RCA stent (unknown type) widely patent, previously placed dist RCA stent (unknown type) widely patent; mean RA 13, PA 23/8, mean PCWP 11, CI 2.65.  3. Carotid stenosis: 2/15 carotid dopplers 1-39% bilateral ICA stenosis.  4. Colon polyps 5. HTN 6. Hyperlipidemia 7. Abdominal US (10/12) with no AAA 8. Aortic stenosis: Echo (2/15) with EF 60-65%, mild LVH, moderate diastolic dysfunction, normal RV size/systolic function, moderate aortic stenosis with mean gradient 31 and AVA 1.16 cm2, ascending aorta 4.4 cm.  Echo (2/16) with EF 60-65%, grade II diastolic dysfunction, ?bicuspid aortic valve with moderate AS (mean gradient 35 mmHg, AVA 1.5 cm^2), mildly dilated RV with normal systolic function.  TEE (2/16) with bicuspid aortic valve, moderate AS mean gradient 30 mmHg, AVA > 1 cm^2, EF 55-60%, mild LVH, mildly dilated RV with normal systolic function, mild AI, mild MR, ascending aorta 4.2 cm.  - Echo (8/17): EF 60-65%, grade II diastolic dysfunction, bicuspid aortic valve with mean gradient 31 mmHg, probably moderate stenosis.  - TEE (9/17): EF 55-60%, bicuspid aortic valve with moderate AS with mean gradient 24 mmHg and AVA 1.05 cm^2, ascending aorta 4.2 cm.  - Echo (9/18): EF 60-65%, bicuspid aortic valve, AVA 1.05 cm^2 with mean gradient 24 mmHg (moderate AS).  - Echo (8/20): EF 60-65%, mild RV dilation with normal systolic function, moderate AS with AVA 1.46 cm^2 and mean gradient 36 mmHg, 4.5 cm ascending aorta.  - Echo (1/22): EF 60-65%,  mild LVH, moderate AS with mean gradient 32 and AVA 1.6 cm^2, bicuspid aortic valve, ascending aorta 46 mm.  - Echo (3/23): EF 60-65% with mild LVH, normal RV, bicuspid aortic valve with severe AS and mean gradient 48 mmHg, AVA 0.91 cm^2. 4.5 cm ascending aorta.  - s/p bioprosthetic AVR with a 25 mm Edwards Resilia Aortic Valve and Replacement of Ascending Aorta (5/23). - Echo (7/23): EF 60-65%, normal RV, stable bioprosthetic aortic valve.  9. Ascending aorta aneurysm: 4.4 cm on 2/15 echo. Ascending aorta 4.2 cm TEE 2/16.  Ascending aorta 4.2 cm on 9/17 TEE.  - CTA chest (9/18): 4.5 cm ascending aorta.  - CTA chest (8/19): 4.5 cm ascending aorta - CTA chest (8/20): 4.6 cm ascending aorta - CTA chest (8/22): 4.4 cm ascending aorta - replacement of ascending aorta 5/23. 10. Chronic diastolic CHF.  11. PFTs (4/16) were within normal limits.  12. Lower extremity arterial dopplers normal in 8/17.  13. Bladder  cancer: S/p chemo and TURBT 14. Pericardial tamponade with pericardial window s/p AVR in 5/23.  15. Atrial fibrillation: Paroxysmal, noted post-op AVR.   SH: Quit smoking in 2013, married, works as Curator.  FH: Father with MI, AVR.  Sister with RyR2 gene.   ROS: All systems reviewed and negative except as per HPI.   Current Outpatient Medications  Medication Sig Dispense Refill   acetaminophen (TYLENOL) 500 MG tablet Take 1-2 tablets (500-1,000 mg total) by mouth every 6 (six) hours as needed for moderate pain. 30 tablet 0   aspirin 81 MG tablet Take 81 mg by mouth every morning.     carvedilol (COREG) 3.125 MG tablet TAKE 1 TABLET BY MOUTH TWICE A DAY WITH A MEAL 180 tablet 1   diazepam (VALIUM) 5 MG tablet Take 1 tablet (5 mg total) by mouth every 12 (twelve) hours as needed for anxiety. 60 tablet 2   empagliflozin (JARDIANCE) 10 MG TABS tablet Take 1 tablet (10 mg total) by mouth daily before breakfast. 30 tablet 11   Ferrous Sulfate (IRON PO) Take 1 tablet by mouth daily. 1 tab  daily     ibuprofen (ADVIL) 200 MG tablet Take by mouth.     ipratropium (ATROVENT) 0.03 % nasal spray Place 2 sprays into both nostrils every 12 (twelve) hours as needed for rhinitis.     KLOR-CON M20 20 MEQ tablet TAKE 2 TABLETS BY MOUTH EVERY DAY 180 tablet 1   Magnesium 500 MG TABS Take 500 mg by mouth daily.     meclizine (ANTIVERT) 25 MG tablet Take 1 tablet (25 mg total) by mouth 3 (three) times daily as needed for dizziness. 30 tablet 0   multivitamin-iron-minerals-folic acid (CENTRUM) chewable tablet Chew 1 tablet by mouth daily.     nitroGLYCERIN (NITROSTAT) 0.4 MG SL tablet Place under the tongue.     phenazopyridine (PYRIDIUM) 200 MG tablet Take 200 mg by mouth every 8 (eight) hours as needed. AZO     rosuvastatin (CRESTOR) 20 MG tablet TAKE 1 TABLET BY MOUTH EVERYDAY AT BEDTIME 90 tablet 3   tamsulosin (FLOMAX) 0.4 MG CAPS capsule Take 0.4 mg by mouth daily.     venlafaxine XR (EFFEXOR-XR) 150 MG 24 hr capsule TAKE 1 CAPSULE BY MOUTH DAILY WITH BREAKFAST. 90 capsule 0   furosemide (LASIX) 20 MG tablet TAKE 60MG  IN THE MORNING AND 40MG  IN THE EVENING 150 tablet 3   No current facility-administered medications for this encounter.   Wt Readings from Last 3 Encounters:  11/14/22 129.3 kg (285 lb)  11/04/22 128.1 kg (282 lb 6.4 oz)  11/03/22 126.1 kg (278 lb)   BP 110/70   Pulse (!) 57   Wt 129.3 kg (285 lb)   SpO2 95%   BMI 46.00 kg/m  General: NAD Neck: JVP 7-8 cm, no thyromegaly or thyroid nodule.  Lungs: Clear to auscultation bilaterally with normal respiratory effort. CV: Nondisplaced PMI.  Heart regular S1/S2, no S3/S4, 2/6 early SEM RUSB.  Trace ankle edema.  No carotid bruit.  Normal pedal pulses.  Abdomen: Soft, nontender, no hepatosplenomegaly, no distention.  Skin: Intact without lesions or rashes.  Neurologic: Alert and oriented x 3.  Psych: Normal affect. Extremities: No clubbing or cyanosis.  HEENT: Normal.   Assessment/Plan: 1. CAD: Prior PCI to RCA in  8/12.  LHC (9/17) with nonobstructive disease. He was started on Imdur for ?microvascular angina but he developed severe headaches without any improvement in symptoms.  Underwent coronary angiography (3/23) as part  of AVR work up and showed patent RCA stents with nonobstructive mild disease. No chest pain now.  - Continue ASA 81 and Crestor.  2. Aortic stenosis: Bicuspid valve with severe AS on 3/23 echo.  He is now s/p bioprosthetic AV replacement and replacement of ascending aorta 5/23. Echo in 7/23 showed normal bioprosthetic aortic valve.  - I will arrange for repeat echo.  3. Hyperlipidemia:  - Continue Crestor.  4. Ascending aortic aneurysm: Associated with bicuspid aortic valve.  S/p replacement of ascending aorta 5/23. 5. Chronic diastolic CHF: Stable NYHA class II symptoms, he is not significantly volume overloaded on exam.  - Continue Lasix 60 mg q am/40 mg q pm. BMET and BNP today. - Continue Jardiance 10 mg daily.  - Continue carvedilol 3.125 mg bid.  - I am not sure why he is on digoxin, he will stop.  - Avoid ibuprofen use, would use Tylenol for aches/pains.  6. OSA: Continue CPAP.  7. HTN: BP controlled.  8. Pericardial effusion: s/p pericardial window 6/23 post-op.  - Repeat echo as above.  9. Atrial fibrillation: Paroxysmal, only noted post-op.  Anticoagulation was stopped with pericardial effusion and later hematuria.  NSR today.   - If AF recurs, will need DOAC.  10. Bladder CA: Ongoing treatment with pembrolizumab.   Marca Ancona  11/15/2022

## 2022-11-16 ENCOUNTER — Other Ambulatory Visit: Payer: Self-pay

## 2022-11-24 ENCOUNTER — Ambulatory Visit (HOSPITAL_COMMUNITY)
Admission: RE | Admit: 2022-11-24 | Discharge: 2022-11-24 | Disposition: A | Discharge status: Home / Self Care | Payer: PPO | Source: Ambulatory Visit | Attending: Internal Medicine | Admitting: Internal Medicine

## 2022-11-24 DIAGNOSIS — E785 Hyperlipidemia, unspecified: Secondary | ICD-10-CM | POA: Insufficient documentation

## 2022-11-24 DIAGNOSIS — I252 Old myocardial infarction: Secondary | ICD-10-CM | POA: Insufficient documentation

## 2022-11-24 DIAGNOSIS — I2510 Atherosclerotic heart disease of native coronary artery without angina pectoris: Secondary | ICD-10-CM | POA: Diagnosis not present

## 2022-11-24 DIAGNOSIS — Z952 Presence of prosthetic heart valve: Secondary | ICD-10-CM | POA: Diagnosis not present

## 2022-11-24 DIAGNOSIS — I7121 Aneurysm of the ascending aorta, without rupture: Secondary | ICD-10-CM | POA: Insufficient documentation

## 2022-11-24 DIAGNOSIS — I110 Hypertensive heart disease with heart failure: Secondary | ICD-10-CM | POA: Insufficient documentation

## 2022-11-24 DIAGNOSIS — I5032 Chronic diastolic (congestive) heart failure: Secondary | ICD-10-CM | POA: Insufficient documentation

## 2022-11-24 DIAGNOSIS — G4730 Sleep apnea, unspecified: Secondary | ICD-10-CM | POA: Diagnosis not present

## 2022-11-24 LAB — ECHOCARDIOGRAM COMPLETE
AR max vel: 2.07 cm2
AV Area VTI: 2.24 cm2
AV Area mean vel: 2.47 cm2
AV Mean grad: 17 mmHg
AV Peak grad: 43.9 mmHg
Ao pk vel: 3.31 m/s
Area-P 1/2: 3.21 cm2
Calc EF: 72.2 %
S' Lateral: 2.8 cm
Single Plane A2C EF: 72.5 %
Single Plane A4C EF: 71.1 %

## 2022-11-24 MED ORDER — PERFLUTREN LIPID MICROSPHERE
1.0000 mL | INTRAVENOUS | Status: AC | PRN
  Administered 2022-11-24: 3 mL via INTRAVENOUS
  Filled 2022-11-24: qty 10

## 2022-11-24 NOTE — Progress Notes (Signed)
Echocardiogram 2D Echocardiogram has been performed.  Luis [name] 11/24/2022, 12:21 PM

## 2022-11-25 ENCOUNTER — Inpatient Hospital Stay: Payer: PPO | Attending: Hematology and Oncology

## 2022-11-25 ENCOUNTER — Other Ambulatory Visit: Payer: Self-pay

## 2022-11-25 ENCOUNTER — Inpatient Hospital Stay: Payer: PPO

## 2022-11-25 ENCOUNTER — Inpatient Hospital Stay (HOSPITAL_BASED_OUTPATIENT_CLINIC_OR_DEPARTMENT_OTHER): Payer: PPO | Admitting: Hematology and Oncology

## 2022-11-25 VITALS — BP 115/69 | HR 56 | Resp 18

## 2022-11-25 DIAGNOSIS — C679 Malignant neoplasm of bladder, unspecified: Secondary | ICD-10-CM

## 2022-11-25 DIAGNOSIS — Z5112 Encounter for antineoplastic immunotherapy: Secondary | ICD-10-CM | POA: Insufficient documentation

## 2022-11-25 DIAGNOSIS — Z79899 Other long term (current) drug therapy: Secondary | ICD-10-CM | POA: Insufficient documentation

## 2022-11-25 LAB — CBC WITH DIFFERENTIAL (CANCER CENTER ONLY)
Abs Immature Granulocytes: 0.02 10*3/uL (ref 0.00–0.07)
Basophils Absolute: 0.1 10*3/uL (ref 0.0–0.1)
Basophils Relative: 1 %
Eosinophils Absolute: 0.3 10*3/uL (ref 0.0–0.5)
Eosinophils Relative: 4 %
HCT: 42.9 % (ref 39.0–52.0)
Hemoglobin: 14.5 g/dL (ref 13.0–17.0)
Immature Granulocytes: 0 %
Lymphocytes Relative: 22 %
Lymphs Abs: 1.7 10*3/uL (ref 0.7–4.0)
MCH: 30.5 pg (ref 26.0–34.0)
MCHC: 33.8 g/dL (ref 30.0–36.0)
MCV: 90.3 fL (ref 80.0–100.0)
Monocytes Absolute: 0.6 10*3/uL (ref 0.1–1.0)
Monocytes Relative: 8 %
Neutro Abs: 5 10*3/uL (ref 1.7–7.7)
Neutrophils Relative %: 65 %
Platelet Count: 151 10*3/uL (ref 150–400)
RBC: 4.75 MIL/uL (ref 4.22–5.81)
RDW: 14.5 % (ref 11.5–15.5)
WBC Count: 7.7 10*3/uL (ref 4.0–10.5)
nRBC: 0 % (ref 0.0–0.2)

## 2022-11-25 LAB — CMP (CANCER CENTER ONLY)
ALT: 28 U/L (ref 0–44)
AST: 24 U/L (ref 15–41)
Albumin: 4.1 g/dL (ref 3.5–5.0)
Alkaline Phosphatase: 89 U/L (ref 38–126)
Anion gap: 7 (ref 5–15)
BUN: 21 mg/dL (ref 8–23)
CO2: 28 mmol/L (ref 22–32)
Calcium: 8.7 mg/dL — ABNORMAL LOW (ref 8.9–10.3)
Chloride: 105 mmol/L (ref 98–111)
Creatinine: 1.14 mg/dL (ref 0.61–1.24)
GFR, Estimated: >60 mL/min (ref >60)
Glucose, Bld: 124 mg/dL — ABNORMAL HIGH (ref 70–99)
Potassium: 3.8 mmol/L (ref 3.5–5.1)
Sodium: 140 mmol/L (ref 135–145)
Total Bilirubin: 0.8 mg/dL (ref 0.3–1.2)
Total Protein: 7.1 g/dL (ref 6.5–8.1)

## 2022-11-25 LAB — TSH: TSH: 1.523 u[IU]/mL (ref 0.350–4.500)

## 2022-11-25 MED ORDER — SODIUM CHLORIDE 0.9 % IV SOLN
200.0000 mg | Freq: Once | INTRAVENOUS | Status: AC
  Administered 2022-11-25: 200 mg via INTRAVENOUS
  Filled 2022-11-25: qty 200

## 2022-11-25 MED ORDER — SODIUM CHLORIDE 0.9 % IV SOLN: Freq: Once | INTRAVENOUS | Status: AC

## 2022-11-25 NOTE — Patient Instructions (Signed)
Luis [name]  Discharge Instructions: Thank you for choosing [facility] to provide your oncology and hematology care.   If you have a lab appointment with the [facility], please go directly to the [facility] and check in at the registration area.   Wear comfortable clothing and clothing appropriate for easy access to any Portacath or PICC line.   We strive to give you quality time with your provider. You may need to reschedule your appointment if you arrive late (15 or more minutes).  Arriving late affects you and other patients whose appointments are after yours.  Also, if you miss three or more appointments without notifying the office, you may be dismissed from the clinic at the provider's discretion.      For prescription refill requests, have your pharmacy contact our office and allow 72 hours for refills to be completed.    Today you received the following chemotherapy and/or immunotherapy agents: Keytruda      To help prevent nausea and vomiting after your treatment, we encourage you to take your nausea medication as directed.  BELOW ARE SYMPTOMS THAT SHOULD BE REPORTED IMMEDIATELY: *FEVER GREATER THAN 100.4 F (38 C) OR HIGHER *CHILLS OR SWEATING *NAUSEA AND VOMITING THAT IS NOT CONTROLLED WITH YOUR NAUSEA MEDICATION *UNUSUAL SHORTNESS OF BREATH *UNUSUAL BRUISING OR BLEEDING *URINARY PROBLEMS (pain or burning when urinating, or frequent urination) *BOWEL PROBLEMS (unusual diarrhea, constipation, pain near the anus) TENDERNESS IN MOUTH AND THROAT WITH OR WITHOUT PRESENCE OF ULCERS (sore throat, sores in mouth, or a toothache) UNUSUAL RASH, SWELLING OR PAIN  UNUSUAL VAGINAL DISCHARGE OR ITCHING   Items with * indicate a potential emergency and should be followed up as soon as possible or go to the Emergency Department if any problems should occur.  Please show the CHEMOTHERAPY ALERT CARD or IMMUNOTHERAPY ALERT CARD at  check-in to the Emergency Department and triage nurse.  Should you have questions after your visit or need to cancel or reschedule your appointment, please contact [facility]  Dept: [phone]  and follow the prompts.  Office hours are 8:00 a.m. to 4:30 p.m. Monday - Friday. Please note that voicemails left after 4:00 p.m. may not be returned until the following business day.  We are closed weekends and major holidays. You have access to a nurse at all times for urgent questions. Please call the main number to the clinic Dept: [phone] and follow the prompts.   For any non-urgent questions, you may also contact your provider using MyChart. We now offer e-Visits for anyone 14 and older to request care online for non-urgent symptoms. For details visit [email or web address].   Also download the MyChart app! Go to the app store, search "MyChart", open the app, select [facility], and log in with your MyChart username and password.

## 2022-11-25 NOTE — Progress Notes (Signed)
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 [name]  08/12/2022: Cycle 1 of Pembrolizumab 09/02/2022: Cycle 2 of Pembrolizumab  09/23/2022: Cycle 3 of Pembrolizumab 10/14/2022: Cycle 4 of Pembrolizumab 11/04/2022: Cycle 5 of Pembrolizumab 11/25/2022: Cycle 6 of Pembrolizumab  CHIEF COMPLAINTS/PURPOSE OF CONSULTATION:  "High Risk Non-Muscle Invasive Bladder Cancer "  HISTORY OF PRESENTING ILLNESS:  Luis [name] 67 y.o. male with medical history significant for BCG unresponsive high risk nonmuscle invasive bladder cancer who presents for a follow up before starting Cycle 6 of Pembrolizumab therapy. He is accompanied by his wife for this visit.   On exam today on exam today Mr. Markie reports he has been at his baseline level of health in the interim since our last visit.  His wife is currently with his grandson who be starting pre-k soon.  He reports he tolerated his last cycle of Keytruda quite well.  Is not causing any fatigue.  His energy and appetite levels are strong.  He does still continue to have burning and straining with urination as well as some  change in the color which he interprets as blood in the urine.  He notes he has an upcoming appointment with urology on 12/14/2022.  He reports that he did recently get stung on his legs bilaterally when he was cutting a branch with a chainsaw.  He notes that otherwise he has been his baseline level health with no questions concerns or complaints today.  Overall he is willing and able to proceed with treatment.  He is not having any nausea, vomiting, or diarrhea.. He denies fevers, chills, sweats, shortness of breath, chest pain, cough, rash or peripheral edema. A full 10 point ROS is otherwise negative.  MEDICAL HISTORY:  Past Medical History:  Diagnosis Date   Anxiety    takes Valium as needed   Aortic stenosis, moderate    Arthritis    Ascending aortic aneurysm (HCC)    CAD (coronary artery disease)    a. s/p PCI of the RCA 8/12 with DES by Dr Excell Seltzer, preserved EF. b. LHC/RHC (2/16) with mean RA 12, PA 32/15, mean PCWP 18, CI 3.47; patent mid and distal RCA stents, 50-60% proximal stenosis small PDA.      Cancer [facility])    bladder   Carotid stenosis    a. Carotid US (05/2013):  Bilateral 1-39% ICA; L thyroid nodule (prior hx of aspiration).   Chronic diastolic CHF (congestive heart failure) (HCC)    Complication of anesthesia    difficulty waking up after gallbladder surgery   Depression    Dyspnea    Essential hypertension    GERD (gastroesophageal reflux disease)    if needed will take OTC meds    Heart murmur    History of colonic polyps  hyperplastic   Hyperlipidemia    Joint pain    Lesion of bladder    Myocardial infarction (HCC) 2012   Obesity (BMI 30-39.9) 02/29/2016   Pre-diabetes    Restless leg    Sleep apnea    uses cpap   Tubular adenoma of colon    Vertigo    takes Meclizine as needed    SURGICAL HISTORY: Past Surgical History:  Procedure Laterality Date   AORTIC VALVE REPLACEMENT N/A 09/16/2021   Procedure: AORTIC VALVE REPLACEMENT (AVR);  Surgeon: Alleen Borne, MD;  [facility];  Service: Open Heart Surgery;  Laterality: N/A;   CATARACT EXTRACTION   4 YRS AGO   BOTH EYES   CHOLECYSTECTOMY  07/21/2011   Procedure: LAPAROSCOPIC CHOLECYSTECTOMY WITH INTRAOPERATIVE CHOLANGIOGRAM;  Surgeon: Kandis Cocking, MD;  Location: [facility] ORS;  Service: General;  Laterality: N/A;   CORONARY ANGIOPLASTY  2012   2 stents   coronary stenting     s/p PCI of the RCA by Dr Excell Seltzer 8/12 with 2 promus stents   CYSTOSCOPY W/ RETROGRADES Bilateral 12/13/2019   Procedure: CYSTOSCOPY WITH RETROGRADE PYELOGRAM;  Surgeon: Sebastian Ache, MD;  Location: [facility];  Service: Urology;  Laterality: Bilateral;   CYSTOSCOPY W/ RETROGRADES Bilateral 10/14/2020   Procedure: CYSTOSCOPY WITH RETROGRADE PYELOGRAM;  Surgeon: Sebastian Ache, MD;  Location: [facility];  Service: Urology;  Laterality: Bilateral;   CYSTOSCOPY W/ RETROGRADES Bilateral 12/16/2020   Procedure: CYSTOSCOPY WITH RETROGRADE PYELOGRAM;  Surgeon: Sebastian Ache, MD;  Location[facility];  Service: Urology;  Laterality: Bilateral;   CYSTOSCOPY W/ RETROGRADES Bilateral 06/03/2022   Procedure: CYSTOSCOPY WITH RETROGRADE PYELOGRAM;  Surgeon: Sebastian Ache, MD;  Location: [facility];  Service: Urology;  Laterality: Bilateral;   LEFT AND RIGHT HEART CATHETERIZATION WITH CORONARY ANGIOGRAM N/A 06/23/2014   Procedure: LEFT AND RIGHT HEART CATHETERIZATION WITH CORONARY ANGIOGRAM;  Surgeon: Laurey Morale, MD;  Location: [facility] CATH LAB;  Service: Cardiovascular;  Laterality: N/A;   NECK SURGERY  03/23/09   per Dr. Ophelia Charter, cervical fusion    PERICARDIOCENTESIS N/A 09/28/2021   Procedure: PERICARDIOCENTESIS;  Surgeon: Corky Crafts, MD;  Location: [facility] INVASIVE CV LAB;  Service: Cardiovascular;  Laterality: N/A;   REPLACEMENT ASCENDING AORTA N/A 09/16/2021   Procedure: REPLACEMENT ASCENDING AORTA WITH 30 X HEMASHIELD PLATINUM WOVEN DOUBLE VELOUR VASCULAR GRAFT;  Surgeon:  Alleen Borne, MD;  Location: [facility] OR;  Service: Open Heart Surgery;  Laterality: N/A;  CIRC ARREST   right elbow surgery     RIGHT HEART CATH AND CORONARY ANGIOGRAPHY N/A 07/08/2021   Procedure: RIGHT HEART CATH AND CORONARY ANGIOGRAPHY;  Surgeon: Laurey Morale, MD;  Location: [facility] INVASIVE CV LAB;  Service: Cardiovascular;  Laterality: N/A;   solonscopy  05/23/08   per Dr. Celene Kras hemorrhoids only, repeat in 5 years   SUBXYPHOID PERICARDIAL WINDOW N/A 09/28/2021   Procedure: SUBXYPHOID PERICARDIAL WINDOW;  Surgeon: Alleen Borne, MD;  Location: [facility] OR;  Service: Thoracic;  Laterality: N/A;   TEE WITHOUT CARDIOVERSION N/A 06/23/2014   Procedure: TRANSESOPHAGEAL ECHOCARDIOGRAM (TEE);  Surgeon: Laurey Morale, MD;  Location: [facility] ENDOSCOPY;  Service: Cardiovascular;  Laterality: N/A;   TEE WITHOUT CARDIOVERSION N/A 01/21/2016   Procedure: TRANSESOPHAGEAL ECHOCARDIOGRAM (TEE);  Surgeon: Laurey Morale, MD;  Location: [facility] ENDOSCOPY;  Service: Cardiovascular;  Laterality: N/A;   TEE WITHOUT CARDIOVERSION N/A 09/16/2021   Procedure: TRANSESOPHAGEAL ECHOCARDIOGRAM (TEE);  Surgeon: Alleen Borne, MD;  Location: [facility] OR;  Service: Open Heart Surgery;  Laterality: N/A;   TEE WITHOUT CARDIOVERSION N/A 09/28/2021   Procedure: TRANSESOPHAGEAL ECHOCARDIOGRAM (TEE);  Surgeon: Alleen Borne, MD;  Location: [facility] OR;  Service: Thoracic;  Laterality: N/A;   TONSILLECTOMY     TRANSURETHRAL RESECTION OF BLADDER TUMOR N/A 10/21/2019   Procedure: TRANSURETHRAL RESECTION OF BLADDER TUMOR (TURBT);  Surgeon: Ihor Gully, MD;  Location: [facility];  Service: Urology;  Laterality: N/A;   TRANSURETHRAL RESECTION OF BLADDER TUMOR N/A 12/13/2019   Procedure: TRANSURETHRAL RESECTION OF BLADDER TUMOR (TURBT);  Surgeon: Sebastian Ache, MD;  Location: [facility];  Service: Urology;  Laterality: N/A;  1 HR   TRANSURETHRAL RESECTION OF BLADDER TUMOR N/A 10/14/2020   Procedure: TRANSURETHRAL  RESECTION OF BLADDER TUMOR (TURBT);  Surgeon: Sebastian Ache, MD;  Location: [facility];  Service: Urology;  Laterality: N/A;   TRANSURETHRAL RESECTION OF BLADDER TUMOR N/A 12/16/2020   Procedure: RESTAGING TRANSURETHRAL RESECTION OF BLADDER TUMOR (TURBT);  Surgeon: Sebastian Ache, MD;  Location: [facility];  Service: Urology;  Laterality: N/A;   TRANSURETHRAL RESECTION OF BLADDER TUMOR N/A 06/03/2022   Procedure: TRANSURETHRAL RESECTION OF BLADDER TUMOR (TURBT);  Surgeon: Sebastian Ache, MD;  Location: [facility];  Service: Urology;  Laterality: N/A;    SOCIAL HISTORY: Social History   Socioeconomic History   Marital status: Married    Spouse name: Not on file   Number of children: 4   Years of education: Not on file   Highest education level: Not on file  Occupational History   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  Tobacco Use   Smoking status: Former    Current packs/day: 0.00    Average packs/day: 2.0 packs/day for 40.0 years (80.0 ttl pk-yrs)    Types: Cigarettes    Start date: 57    Quit date: 2012    Years since quitting: 12.5   Smokeless tobacco: Never  Vaping Use   Vaping status: Never Used  Substance and Sexual Activity   Alcohol use: No    Alcohol/week: 0.0 standard drinks of alcohol   Drug use: No   Sexual activity: Not on file  Other Topics Concern   Not on file  Social History Narrative   Not on file   Social Determinants of Health   Financial Resource Strain: Low Risk  (11/03/2022)   Overall Financial Resource Strain (CARDIA)    Difficulty of Paying Living Expenses: Not hard at all  Food Insecurity: No Food Insecurity (11/03/2022)   Hunger Vital Sign    Worried About Running Out of Food in the Last Year: Never true    Ran Out of Food in the Last Year: Never true  Transportation Needs: No Transportation Needs (11/03/2022)   PRAPARE - Administrator, Civil Service (Medical): No     Lack of Transportation (Non-Medical): No  Physical Activity: Unknown (11/03/2022)   Exercise Vital Sign    Days of Exercise per Week: Patient unable to answer    Minutes of Exercise per Session: 30 min  Stress: No Stress Concern Present (11/03/2022)   [employer] of Occupational Health - Occupational Stress Questionnaire    Feeling of Stress : Not at all  Social Connections: Moderately Isolated (11/03/2022)   Social Connection and Isolation Panel [NHANES]    Frequency of Communication with Friends and Family: More than three times a week    Frequency of Social Gatherings with Friends and Family: More  than three times a week    Attends Religious Services: Never    Active Member of Clubs or Organizations: No    Attends Banker Meetings: Never    Marital Status: Married  Catering manager Violence: Not At Risk (11/03/2022)   Humiliation, Afraid, Rape, and Kick questionnaire    Fear of Current or Ex-Partner: No    Emotionally Abused: No    Physically Abused: No    Sexually Abused: No    FAMILY HISTORY: Family History  Problem Relation Age of Onset   Lung cancer Mother        lung   Esophageal cancer Cousin    Colon cancer Neg Hx    Rectal cancer Neg Hx    Stomach cancer Neg Hx     ALLERGIES:  has No Known Allergies.  MEDICATIONS:  Current Outpatient Medications  Medication Sig Dispense Refill   acetaminophen (TYLENOL) 500 MG tablet Take 1-2 tablets (500-1,000 mg total) by mouth every 6 (six) hours as needed for moderate pain. 30 tablet 0   aspirin 81 MG tablet Take 81 mg by mouth every morning.     carvedilol (COREG) 3.125 MG tablet TAKE 1 TABLET BY MOUTH TWICE A DAY WITH A MEAL 180 tablet 1   diazepam (VALIUM) 5 MG tablet Take 1 tablet (5 mg total) by mouth every 12 (twelve) hours as needed for anxiety. 60 tablet 2   empagliflozin (JARDIANCE) 10 MG TABS tablet Take 1 tablet (10 mg total) by mouth daily before breakfast. 30 tablet 11   Ferrous Sulfate (IRON PO)  Take 1 tablet by mouth daily. 1 tab daily     furosemide (LASIX) 20 MG tablet TAKE 60MG  IN THE MORNING AND 40MG  IN THE EVENING 150 tablet 3   ibuprofen (ADVIL) 200 MG tablet Take by mouth.     ipratropium (ATROVENT) 0.03 % nasal spray Place 2 sprays into both nostrils every 12 (twelve) hours as needed for rhinitis.     KLOR-CON M20 20 MEQ tablet TAKE 2 TABLETS BY MOUTH EVERY DAY 180 tablet 1   Magnesium 500 MG TABS Take 500 mg by mouth daily.     meclizine (ANTIVERT) 25 MG tablet Take 1 tablet (25 mg total) by mouth 3 (three) times daily as needed for dizziness. 30 tablet 0   multivitamin-iron-minerals-folic acid (CENTRUM) chewable tablet Chew 1 tablet by mouth daily.     nitroGLYCERIN (NITROSTAT) 0.4 MG SL tablet Place under the tongue.     phenazopyridine (PYRIDIUM) 200 MG tablet Take 200 mg by mouth every 8 (eight) hours as needed. AZO     rosuvastatin (CRESTOR) 20 MG tablet TAKE 1 TABLET BY MOUTH EVERYDAY AT BEDTIME 90 tablet 3   tamsulosin (FLOMAX) 0.4 MG CAPS capsule Take 0.4 mg by mouth daily.     venlafaxine XR (EFFEXOR-XR) 150 MG 24 hr capsule TAKE 1 CAPSULE BY MOUTH DAILY WITH BREAKFAST. 90 capsule 0   No current facility-administered medications for this visit.   Facility-Administered Medications Ordered in Other Visits  Medication Dose Route Frequency Provider Last Rate Last Admin   pembrolizumab (KEYTRUDA) 200 mg in sodium chloride 0.9 % 50 mL chemo infusion  200 mg Intravenous Once Jaci Standard, MD        REVIEW OF SYSTEMS:   Constitutional: ( - ) fevers, ( - )  chills , ( - ) night sweats Eyes: ( - ) blurriness of vision, ( - ) double vision, ( - ) watery eyes Ears, nose, mouth,  throat, and face: ( - ) mucositis, ( - ) sore throat Respiratory: ( - ) cough, ( - ) dyspnea, ( - ) wheezes Cardiovascular: ( - ) palpitation, ( - ) chest discomfort, ( - ) lower extremity swelling Gastrointestinal:  ( - ) nausea, ( - ) heartburn, ( - ) change in bowel habits Skin: ( - )  abnormal skin rashes Lymphatics: ( - ) new lymphadenopathy, ( - ) easy bruising Neurological: ( - ) numbness, ( - ) tingling, ( - ) new weaknesses Behavioral/Psych: ( - ) mood change, ( - ) new changes  All other systems were reviewed with the patient and are negative.  PHYSICAL EXAMINATION: ECOG PERFORMANCE STATUS: 1 - Symptomatic but completely ambulatory  Vitals:   11/25/22 0929  BP: (!) 154/88  Pulse: 64  Resp: 13  Temp: 97.7 F (36.5 C)  SpO2: 96%      Filed Weights   11/25/22 0929  Weight: 283 lb 9.6 oz (128.6 kg)    GENERAL: well appearing elderly Caucasian male in NAD  SKIN: skin color, texture, turgor are normal, no rashes or significant lesions EYES: conjunctiva are pink and non-injected, sclera clear LUNGS: clear to auscultation and percussion with normal breathing effort HEART: regular rate & rhythm and no murmurs and no lower extremity edema Musculoskeletal: no cyanosis of digits and no clubbing  PSYCH: alert & oriented x 3, fluent speech NEURO: no focal motor/sensory deficits  LABORATORY DATA:  I have reviewed the data as listed    Latest Ref Rng & Units 11/25/2022    9:11 AM 11/04/2022    7:54 AM 10/14/2022    9:29 AM  CBC  WBC 4.0 - 10.5 K/uL 7.7  6.8  6.6   Hemoglobin 13.0 - 17.0 g/dL 95.1  88.4  16.6   Hematocrit 39.0 - 52.0 % 42.9  43.9  43.8   Platelets 150 - 400 K/uL 151  150  144        Latest Ref Rng & Units 11/25/2022    9:11 AM 11/15/2022    1:33 PM 11/04/2022    7:54 AM  CMP  Glucose 70 - 99 mg/dL 063  016  010   BUN 8 - 23 mg/dL 21  21  21    Creatinine 0.61 - 1.24 mg/dL 9.32  3.55  7.32   Sodium 135 - 145 mmol/L 140  137  138   Potassium 3.5 - 5.1 mmol/L 3.8  3.8  3.7   Chloride 98 - 111 mmol/L 105  102  104   CO2 22 - 32 mmol/L 28  24  26    Calcium 8.9 - 10.3 mg/dL 8.7  8.8  9.1   Total Protein 6.5 - 8.1 g/dL 7.1   7.4   Total Bilirubin 0.3 - 1.2 mg/dL 0.8   0.8   Alkaline Phos 38 - 126 U/L 89   85   AST 15 - 41 U/L 24   23   ALT  0 - 44 U/L 28   27     RADIOGRAPHIC STUDIES: ECHOCARDIOGRAM COMPLETE  Result Date: 11/24/2022    ECHOCARDIOGRAM REPORT   Patient Name:   ELMO RIO Date of Exam: 11/24/2022 Medical Rec #:  [id number]        Height:       66.0 in Accession #:    [id number]       Weight:       285.0 lb Date of Birth:  1955/07/22  BSA:          2.325 m Patient Age:    66 years         BP:           110/70 mmHg Patient Gender: M                HR:           60 bpm. Exam Location:  Outpatient Procedure: 2D Echo, Cardiac Doppler, Color Doppler and Intracardiac            Opacification Agent Indications:    Congestive Heart Failure I50.9  History:        Patient has prior history of Echocardiogram examinations, most                 recent 10/29/2021. CHF, CAD and Previous Myocardial Infarction,                 Aortic Valve Disease, Signs/Symptoms:Dyspnea; Risk                 Factors:Hypertension, Dyslipidemia and Sleep Apnea. Ascending                 Aortic Aneurysm.                 Aortic Valve: 25 mm Edwards Inspiris Resilia valve is present in                 the aortic position. Procedure Date: 09/16/21.  Sonographer:    Eulah Pont RDCS Referring Phys: [id number] MCLEAN IMPRESSIONS  1. Left ventricular ejection fraction, by estimation, is 60 to 65%. The left ventricle has normal function. The left ventricle has no regional wall motion abnormalities. There is mild left ventricular hypertrophy. Left ventricular diastolic parameters were normal.  2. Right ventricular systolic function is normal. The right ventricular size is normal. Tricuspid regurgitation signal is inadequate for assessing PA pressure.  3. The mitral valve is normal in structure. No evidence of mitral valve regurgitation.  4. No significant PVL.[name] The aortic valve was not well visualized. Aortic valve regurgitation is not visualized. There is a 25 mm Edwards Inspiris Resilia valve present in the aortic position. Procedure Date: 09/16/21.  5. The  inferior vena cava is normal in size with greater than 50% respiratory variability, suggesting right atrial pressure of 3 mmHg. FINDINGS  Left Ventricle: Left ventricular ejection fraction, by estimation, is 60 to 65%. The left ventricle has normal function. The left ventricle has no regional wall motion abnormalities. Definity contrast agent was given IV to delineate the left ventricular  endocardial borders. The left ventricular internal cavity size was normal in size. There is mild left ventricular hypertrophy. Left ventricular diastolic parameters were normal. Right Ventricle: The right ventricular size is normal. Right ventricular systolic function is normal. Tricuspid regurgitation signal is inadequate for assessing PA pressure. Left Atrium: Left atrial size was normal in size. Right Atrium: Right atrial size was normal in size. Pericardium: There is no evidence of pericardial effusion. Mitral Valve: The mitral valve is normal in structure. No evidence of mitral valve regurgitation. Tricuspid Valve: Tricuspid valve regurgitation is not demonstrated. Aortic Valve: No significant PVL. The aortic valve was not well visualized. Aortic valve regurgitation is not visualized. Aortic valve mean gradient measures 17.0 mmHg. Aortic valve peak gradient measures 43.9 mmHg. Aortic valve area, by VTI measures 2.24 cm. There is a 25 mm Edwards Inspiris Resilia valve present in the aortic position. Procedure Date:  09/16/21. Pulmonic Valve: The pulmonic valve was normal in structure. Pulmonic valve regurgitation is not visualized. Aorta: The aortic root and ascending aorta are structurally normal, with no evidence of dilitation. Venous: The inferior vena cava is normal in size with greater than 50% respiratory variability, suggesting right atrial pressure of 3 mmHg. IAS/Shunts: The interatrial septum was not well visualized.  LEFT VENTRICLE PLAX 2D LVIDd:         4.70 cm      Diastology LVIDs:         2.80 cm      LV e'  medial:    8.73 cm/s LV PW:         1.10 cm      LV E/e' medial:  12.0 LV IVS:        1.10 cm      LV e' lateral:   11.50 cm/s LVOT diam:     2.50 cm      LV E/e' lateral: 9.1 LV SV:         145 LV SV Index:   62 LVOT Area:     4.91 cm  LV Volumes (MOD) LV vol d, MOD A2C: 106.0 ml LV vol d, MOD A4C: 135.0 ml LV vol s, MOD A2C: 29.1 ml LV vol s, MOD A4C: 39.0 ml LV SV MOD A2C:     76.9 ml LV SV MOD A4C:     135.0 ml LV SV MOD BP:      88.2 ml RIGHT VENTRICLE RV S prime:     9.53 cm/s TAPSE (M-mode): 1.8 cm LEFT ATRIUM             Index        RIGHT ATRIUM           Index LA diam:        4.80 cm 2.06 cm/m   RA Area:     22.00 cm LA Vol (A2C):   44.0 ml 18.93 ml/m  RA Volume:   64.40 ml  27.70 ml/m LA Vol (A4C):   52.8 ml 22.71 ml/m LA Biplane Vol: 51.6 ml 22.20 ml/m  AORTIC VALVE AV Area (Vmax):    2.07 cm AV Area (Vmean):   2.47 cm AV Area (VTI):     2.24 cm AV Vmax:           331.33 cm/s AV Vmean:          182.000 cm/s AV VTI:            0.646 m AV Peak Grad:      43.9 mmHg AV Mean Grad:      17.0 mmHg LVOT Vmax:         140.00 cm/s LVOT Vmean:        91.500 cm/s LVOT VTI:          0.295 m LVOT/AV VTI ratio: 0.46  AORTA Ao Root diam: 3.80 cm Ao Asc diam:  3.40 cm MITRAL VALVE MV Area (PHT): 3.21 cm     SHUNTS MV Decel Time: 236 msec     Systemic VTI:  0.30 m MV E velocity: 105.00 cm/s  Systemic Diam: 2.50 cm MV A velocity: 95.60 cm/s MV E/A ratio:  1.10 Halford Decamp signed by Carolan Clines Signature Date/Time: 11/24/2022/1:36:45 PM    Final     ASSESSMENT & PLAN Luis [name] 67 y.o. 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  After review of the labs, review of the records, and discussion with the patient the patients findings are most consistent with BCG unresponsive high risk non-muscle invasive bladder cancer, not a candidate for cystectomy.  # BCG-Unresponsive High Risk Non-Muscle Invasive  Bladder Cancer --patient is not felt to be a candidate for cystectomy -- Recommended pembrolizumab 200 mg q. 21 days until progression or intolerance up to 24 months. --Started Cycle 1 of Pembrolizumab on 08/12/2022.  PLAN: --Due to start Cycle 6 of Pembrolizumab therapy today --Labs from today were reviewed and adequate for treatment. WBC 7.7, hemoglobin 14.5, MCV 90.3, and platelets 151. Creatinine and LFTs normal.  --Proceed with treatment today without any modifications --CT scans are not able to monitor for progression of his cancer.  Cystoscopies are the preferred monitoring strategy for his cancer. --RTC in 3 weeks with labs and follow up visit before Cycle 7  # Abdominal Rash -- Small circular rash, approximately 3 inches in diameter. -- Patient has access to hydrocortisone 5% at home, recommend he apply that to the lesion 1-2 times daily to see if there is improvement.  #Urinary frequency and hematuria: -- Defer to urology for further evaluation and management  #Supportive Care -- chemotherapy education to be scheduled  -- port placement not required  -- no pain medication required at this time.   No orders of the defined types were placed in this encounter.  All questions were answered. The patient knows to call the clinic with any problems, questions or concerns.  I have spent a total of 30 minutes minutes of face-to-face and non-face-to-face time, preparing to see the patient, performing a medically appropriate examination, counseling and educating the patient, documenting clinical information in the electronic health record,and care coordination.   Ulysees Barns, MD Department of Hematology/Oncology [facility] at [facility] Phone: [phone] Pager: [phone] Email: [email or web address]  11/25/2022 10:17 AM  Literature Support:  Pembrolizumab monotherapy for the treatment of high-risk non-muscle-invasive bladder cancer unresponsive  to BCG ([id number]): an open-label, single-arm, multicentre, phase 2 study,The Lancet Oncology,Volume 22, Issue 7,2021,Pages 919-930,ISSN 4403-4742  -- Pembrolizumab monotherapy was tolerable and showed promising antitumour activity in patients with BCG-unresponsive non-muscle-invasive bladder cancer who declined or were ineligible for radical cystectomy and should be considered a a clinically active non-surgical treatment option in this difficult-to-treat population.  --All enrolled patients were assigned to receive pembrolizumab 200 mg intravenously every 3 weeks for up to 24 months or until centrally confirmed disease persistence, recurrence, or progression; unacceptable toxic effects

## 2022-11-28 DIAGNOSIS — G4733 Obstructive sleep apnea (adult) (pediatric): Secondary | ICD-10-CM | POA: Diagnosis not present

## 2022-12-05 ENCOUNTER — Other Ambulatory Visit (HOSPITAL_COMMUNITY): Payer: Self-pay | Admitting: Cardiology

## 2022-12-12 DIAGNOSIS — R338 Other retention of urine: Secondary | ICD-10-CM | POA: Diagnosis not present

## 2022-12-12 DIAGNOSIS — C678 Malignant neoplasm of overlapping sites of bladder: Secondary | ICD-10-CM | POA: Diagnosis not present

## 2022-12-12 DIAGNOSIS — D413 Neoplasm of uncertain behavior of urethra: Secondary | ICD-10-CM | POA: Diagnosis not present

## 2022-12-14 ENCOUNTER — Ambulatory Visit (INDEPENDENT_AMBULATORY_CARE_PROVIDER_SITE_OTHER): Payer: PPO | Admitting: Surgery

## 2022-12-14 ENCOUNTER — Encounter: Payer: Self-pay | Admitting: Surgery

## 2022-12-14 ENCOUNTER — Ambulatory Visit
Admission: RE | Admit: 2022-12-14 | Discharge: 2022-12-14 | Disposition: A | Discharge status: Home / Self Care | Payer: PPO | Source: Ambulatory Visit | Attending: Surgery | Admitting: Surgery

## 2022-12-14 VITALS — BP 143/81 | HR 65 | Resp 18 | Ht 66.0 in | Wt 282.0 lb

## 2022-12-14 DIAGNOSIS — Z952 Presence of prosthetic heart valve: Secondary | ICD-10-CM

## 2022-12-14 DIAGNOSIS — I7121 Aneurysm of the ascending aorta, without rupture: Secondary | ICD-10-CM

## 2022-12-14 DIAGNOSIS — I700 Atherosclerosis of aorta: Secondary | ICD-10-CM | POA: Diagnosis not present

## 2022-12-14 MED ORDER — IOPAMIDOL (ISOVUE-370) INJECTION 76%
500.0000 mL | Freq: Once | INTRAVENOUS | Status: AC | PRN
  Administered 2022-12-14: 75 mL via INTRAVENOUS

## 2022-12-14 NOTE — Progress Notes (Signed)
HPI:  The patient is a 67 year old gentleman who underwent aortic valve replacement using a 25 mm Inspiris Resilia pericardial valve and supra-coronary replacement of the ascending aorta under hypothermic circulatory arrest on 09/16/2021.  He has some postoperative atrial fibrillation does converted with amiodarone.  He is not anticoagulated due to his history of bladder cancer with recurrent hematuria.  He continues to do well over the past year but is still having problems related to his bladder cancer.  He has undergone multiple procedures for that and it has been recurrent.  He is being followed by urology and oncology.  He denies any chest pain or pressure.  He has had no shortness of breath.    Current Outpatient Medications  Medication Sig Dispense Refill   acetaminophen (TYLENOL) 500 MG tablet Take 1-2 tablets (500-1,000 mg total) by mouth every 6 (six) hours as needed for moderate pain. 30 tablet 0   aspirin 81 MG tablet Take 81 mg by mouth every morning.     carvedilol (COREG) 3.125 MG tablet TAKE 1 TABLET BY MOUTH TWICE A DAY WITH FOOD 180 tablet 1   diazepam (VALIUM) 5 MG tablet Take 1 tablet (5 mg total) by mouth every 12 (twelve) hours as needed for anxiety. 60 tablet 2   empagliflozin (JARDIANCE) 10 MG TABS tablet Take 1 tablet (10 mg total) by mouth daily before breakfast. 30 tablet 11   Ferrous Sulfate (IRON PO) Take 1 tablet by mouth daily. 1 tab daily     furosemide (LASIX) 20 MG tablet TAKE 60MG  IN THE MORNING AND 40MG  IN THE EVENING 150 tablet 3   ibuprofen (ADVIL) 200 MG tablet Take by mouth.     ipratropium (ATROVENT) 0.03 % nasal spray Place 2 sprays into both nostrils every 12 (twelve) hours as needed for rhinitis.     KLOR-CON M20 20 MEQ tablet TAKE 2 TABLETS BY MOUTH EVERY DAY 180 tablet 1   Magnesium 500 MG TABS Take 500 mg by mouth daily.     meclizine (ANTIVERT) 25 MG tablet Take 1 tablet (25 mg total) by mouth 3 (three) times daily as needed for dizziness. 30  tablet 0   multivitamin-iron-minerals-folic acid (CENTRUM) chewable tablet Chew 1 tablet by mouth daily.     nitroGLYCERIN (NITROSTAT) 0.4 MG SL tablet Place under the tongue.     phenazopyridine (PYRIDIUM) 200 MG tablet Take 200 mg by mouth every 8 (eight) hours as needed. AZO     rosuvastatin (CRESTOR) 20 MG tablet TAKE 1 TABLET BY MOUTH EVERYDAY AT BEDTIME 90 tablet 3   tamsulosin (FLOMAX) 0.4 MG CAPS capsule Take 0.4 mg by mouth daily.     venlafaxine XR (EFFEXOR-XR) 150 MG 24 hr capsule TAKE 1 CAPSULE BY MOUTH DAILY WITH BREAKFAST. 90 capsule 0   No current facility-administered medications for this visit.     Physical Exam: BP (!) 143/81 (BP Location: Left Arm, Patient Position: Sitting)   Pulse 65   Resp 18   Ht 5\' 6"  (1.676 m)   Wt 282 lb (127.9 kg)   SpO2 91% Comment: RA  BMI 45.52 kg/m  He looks well. Cardiac exam shows a regular rate and rhythm with a 2/6 systolic flow murmur along the right sternal border.  There is no diastolic murmur. Lungs are clear. There is no peripheral edema.  Diagnostic Tests:  ECHOCARDIOGRAM REPORT       Patient Name:   Luis [name] Date of Exam: 11/24/2022  Medical Rec #:  [id number]        Height:       66.0 in  Accession #:    [id number]       Weight:       285.0 lb  Date of Birth:  04-Feb-1956        BSA:          2.325 m  Patient Age:    66 years         BP:           110/70 mmHg  Patient Gender: M                HR:           60 bpm.  Exam Location:  Outpatient   Procedure: 2D Echo, Cardiac Doppler, Color Doppler and Intracardiac             Opacification Agent   Indications:    Congestive Heart Failure I50.9    History:        Patient has prior history of Echocardiogram examinations,  most                 recent 10/29/2021. CHF, CAD and Previous Myocardial  Infarction,                 Aortic Valve Disease, Signs/Symptoms:Dyspnea; Risk                  Factors:Hypertension, Dyslipidemia and Sleep Apnea.  Ascending                  Aortic Aneurysm.                  Aortic Valve: 25 mm Edwards Inspiris Resilia valve is  present in                  the aortic position. Procedure Date: 09/16/21.    Sonographer:    Eulah Pont RDCS  Referring Phys: [id number] MCLEAN   IMPRESSIONS     1. Left ventricular ejection fraction, by estimation, is 60 to 65%. The  left ventricle has normal function. The left ventricle has no regional  wall motion abnormalities. There is mild left ventricular hypertrophy.  Left ventricular diastolic parameters  were normal.   2. Right ventricular systolic function is normal. The right ventricular  size is normal. Tricuspid regurgitation signal is inadequate for assessing  PA pressure.   3. The mitral valve is normal in structure. No evidence of mitral valve  regurgitation.   4. No significant PVL.[name] The aortic valve was not well visualized. Aortic  valve regurgitation is not visualized. There is a 25 mm Edwards Inspiris  Resilia valve present in the aortic position. Procedure Date: 09/16/21.   5. The inferior vena cava is normal in size with greater than 50%  respiratory variability, suggesting right atrial pressure of 3 mmHg.   FINDINGS   Left Ventricle: Left ventricular ejection fraction, by estimation, is 60  to 65%. The left ventricle has normal function. The left ventricle has no  regional wall motion abnormalities. Definity contrast agent was given IV  to delineate the left ventricular   endocardial borders. The left ventricular internal cavity size was normal  in size. There is mild left ventricular hypertrophy. Left ventricular  diastolic parameters were normal.   Right Ventricle: The right ventricular size is normal. Right ventricular  systolic function is normal. Tricuspid regurgitation signal is inadequate  for assessing PA pressure.  Left Atrium: Left atrial size was normal in size.   Right Atrium: Right atrial size was normal in size.   Pericardium: There is  no evidence of pericardial effusion.   Mitral Valve: The mitral valve is normal in structure. No evidence of  mitral valve regurgitation.   Tricuspid Valve: Tricuspid valve regurgitation is not demonstrated.   Aortic Valve: No significant PVL. The aortic valve was not well  visualized. Aortic valve regurgitation is not visualized. Aortic valve  mean gradient measures 17.0 mmHg. Aortic valve peak gradient measures 43.9  mmHg. Aortic valve area, by VTI measures  2.24 cm. There is a 25 mm Edwards Inspiris Resilia valve present in the  aortic position. Procedure Date: 09/16/21.   Pulmonic Valve: The pulmonic valve was normal in structure. Pulmonic valve  regurgitation is not visualized.   Aorta: The aortic root and ascending aorta are structurally normal, with  no evidence of dilitation.   Venous: The inferior vena cava is normal in size with greater than 50%  respiratory variability, suggesting right atrial pressure of 3 mmHg.   IAS/Shunts: The interatrial septum was not well visualized.     LEFT VENTRICLE  PLAX 2D  LVIDd:         4.70 cm      Diastology  LVIDs:         2.80 cm      LV e' medial:    8.73 cm/s  LV PW:         1.10 cm      LV E/e' medial:  12.0  LV IVS:        1.10 cm      LV e' lateral:   11.50 cm/s  LVOT diam:     2.50 cm      LV E/e' lateral: 9.1  LV SV:         145  LV SV Index:   62  LVOT Area:     4.91 cm    LV Volumes (MOD)  LV vol d, MOD A2C: 106.0 ml  LV vol d, MOD A4C: 135.0 ml  LV vol s, MOD A2C: 29.1 ml  LV vol s, MOD A4C: 39.0 ml  LV SV MOD A2C:     76.9 ml  LV SV MOD A4C:     135.0 ml  LV SV MOD BP:      88.2 ml   RIGHT VENTRICLE  RV S prime:     9.53 cm/s  TAPSE (M-mode): 1.8 cm   LEFT ATRIUM             Index        RIGHT ATRIUM           Index  LA diam:        4.80 cm 2.06 cm/m   RA Area:     22.00 cm  LA Vol (A2C):   44.0 ml 18.93 ml/m  RA Volume:   64.40 ml  27.70 ml/m  LA Vol (A4C):   52.8 ml 22.71 ml/m  LA Biplane Vol: 51.6  ml 22.20 ml/m   AORTIC VALVE  AV Area (Vmax):    2.07 cm  AV Area (Vmean):   2.47 cm  AV Area (VTI):     2.24 cm  AV Vmax:           331.33 cm/s  AV Vmean:          182.000 cm/s  AV VTI:  0.646 m  AV Peak Grad:      43.9 mmHg  AV Mean Grad:      17.0 mmHg  LVOT Vmax:         140.00 cm/s  LVOT Vmean:        91.500 cm/s  LVOT VTI:          0.295 m  LVOT/AV VTI ratio: 0.46    AORTA  Ao Root diam: 3.80 cm  Ao Asc diam:  3.40 cm   MITRAL VALVE  MV Area (PHT): 3.21 cm     SHUNTS  MV Decel Time: 236 msec     Systemic VTI:  0.30 m  MV E velocity: 105.00 cm/s  Systemic Diam: 2.50 cm  MV A velocity: 95.60 cm/s  MV E/A ratio:  1.10   Actuary signed by Carolan Clines  Signature Date/Time: 11/24/2022/1:36:45 PM        Final      Narrative & Impression  CLINICAL DATA:  Follow-up ascending aortic aneurysm   EXAM: CT ANGIOGRAPHY CHEST WITH CONTRAST   TECHNIQUE: Multidetector CT imaging of the chest was performed using the standard protocol during bolus administration of intravenous contrast. Multiplanar CT image reconstructions and MIPs were obtained to evaluate the vascular anatomy.   RADIATION DOSE REDUCTION: This exam was performed according to the departmental dose-optimization program which includes automated exposure control, adjustment of the mA and/or kV according to patient size and/or use of iterative reconstruction technique.   CONTRAST:  75mL ISOVUE-370 IOPAMIDOL (ISOVUE-370) INJECTION 76%   COMPARISON:  CTA chest dated September 28, 2021   FINDINGS: Cardiovascular: Prior aortic valve replacement and graft repair of the ascending thoracic aorta. No evidence of pseudoaneurysm. Normal caliber aortic arch and descending thoracic aorta with mild atherosclerotic disease. Severe left main and three-vessel coronary artery calcifications. Normal heart size. No pericardial effusion.   Mediastinum/Nodes: Esophagus and thyroid are unremarkable.  No enlarged lymph nodes seen in the chest.   Lungs/Pleura: Central airways are patent. No consolidation, pleural effusion or pneumothorax.   Upper Abdomen: No acute abnormality.   Musculoskeletal: Prior median sternotomy. No aggressive appearing osseous lesions.   Review of the MIP images confirms the above findings.   IMPRESSION: 1. Prior aortic valve replacement and graft repair of the ascending thoracic aorta. No evidence of pseudoaneurysm. 2. Coronary artery calcifications and aortic Atherosclerosis (ICD10-I70.0).     Electronically Signed   By: Allegra Lai M.D.   On: 12/14/2022 15:26       Impression:  He is doing well from a cardiac standpoint approximately 15 months after his surgery.  2D echocardiogram shows a normal-appearing aortic valve prosthesis with a mean gradient of 17 mmHg.  This is increased from 12.8 mmHg last July.  Left ventricular ejection fraction is normal.  There is no paravalvular leak.  CTA of the chest shows a stable repair of the ascending aortic aneurysm with no evidence of pseudoaneurysm or progressive aneurysmal disease of the remaining native aorta.  I reviewed the echo and CTA images with him and answered all of his questions.  I stressed the importance of continued good blood pressure control and preventing further enlargement of the residual native aorta and acute aortic dissection.  I do not think he requires further CT scans but should have a periodic echocardiogram to follow his aortic valve prosthesis.  Plan:  He will continue to follow-up with cardiology and his PCP as well as urology/oncology for his bladder cancer.  I will  be happy to see him back if the need arises.  I spent 15 minutes performing this established patient evaluation and > 50% of this time was spent face to face counseling and coordinating the surveillance of his previously resected ascending aortic aneurysm.   Alleen Borne, MD [facility] [phone]

## 2022-12-16 ENCOUNTER — Ambulatory Visit: Payer: PPO | Admitting: Physician Assistant

## 2022-12-16 ENCOUNTER — Ambulatory Visit: Payer: PPO

## 2022-12-16 ENCOUNTER — Other Ambulatory Visit: Payer: PPO

## 2022-12-16 ENCOUNTER — Other Ambulatory Visit: Payer: Self-pay | Admitting: Urology

## 2022-12-20 ENCOUNTER — Inpatient Hospital Stay: Payer: PPO

## 2022-12-20 ENCOUNTER — Inpatient Hospital Stay (HOSPITAL_BASED_OUTPATIENT_CLINIC_OR_DEPARTMENT_OTHER): Payer: PPO | Admitting: Physician Assistant

## 2022-12-20 ENCOUNTER — Other Ambulatory Visit: Payer: Self-pay

## 2022-12-20 ENCOUNTER — Telehealth (HOSPITAL_COMMUNITY): Payer: Self-pay | Admitting: Cardiology

## 2022-12-20 VITALS — BP 155/72 | HR 68 | Temp 97.9°F | Resp 18 | Wt 282.6 lb

## 2022-12-20 DIAGNOSIS — Z5112 Encounter for antineoplastic immunotherapy: Secondary | ICD-10-CM | POA: Diagnosis not present

## 2022-12-20 DIAGNOSIS — C679 Malignant neoplasm of bladder, unspecified: Secondary | ICD-10-CM

## 2022-12-20 LAB — CMP (CANCER CENTER ONLY)
ALT: 25 U/L (ref 0–44)
AST: 24 U/L (ref 15–41)
Albumin: 4.1 g/dL (ref 3.5–5.0)
Alkaline Phosphatase: 83 U/L (ref 38–126)
Anion gap: 8 (ref 5–15)
BUN: 23 mg/dL (ref 8–23)
CO2: 28 mmol/L (ref 22–32)
Calcium: 8.9 mg/dL (ref 8.9–10.3)
Chloride: 103 mmol/L (ref 98–111)
Creatinine: 1.32 mg/dL — ABNORMAL HIGH (ref 0.61–1.24)
GFR, Estimated: 59 mL/min — ABNORMAL LOW (ref >60)
Glucose, Bld: 149 mg/dL — ABNORMAL HIGH (ref 70–99)
Potassium: 4 mmol/L (ref 3.5–5.1)
Sodium: 139 mmol/L (ref 135–145)
Total Bilirubin: 0.8 mg/dL (ref 0.3–1.2)
Total Protein: 7.1 g/dL (ref 6.5–8.1)

## 2022-12-20 LAB — CBC WITH DIFFERENTIAL (CANCER CENTER ONLY)
Abs Immature Granulocytes: 0.03 10*3/uL (ref 0.00–0.07)
Basophils Absolute: 0 10*3/uL (ref 0.0–0.1)
Basophils Relative: 1 %
Eosinophils Absolute: 0.3 10*3/uL (ref 0.0–0.5)
Eosinophils Relative: 4 %
HCT: 43.7 % (ref 39.0–52.0)
Hemoglobin: 14.4 g/dL (ref 13.0–17.0)
Immature Granulocytes: 0 %
Lymphocytes Relative: 28 %
Lymphs Abs: 1.9 10*3/uL (ref 0.7–4.0)
MCH: 30.1 pg (ref 26.0–34.0)
MCHC: 33 g/dL (ref 30.0–36.0)
MCV: 91.2 fL (ref 80.0–100.0)
Monocytes Absolute: 0.5 10*3/uL (ref 0.1–1.0)
Monocytes Relative: 7 %
Neutro Abs: 4.1 10*3/uL (ref 1.7–7.7)
Neutrophils Relative %: 60 %
Platelet Count: 148 10*3/uL — ABNORMAL LOW (ref 150–400)
RBC: 4.79 MIL/uL (ref 4.22–5.81)
RDW: 14.3 % (ref 11.5–15.5)
WBC Count: 6.8 10*3/uL (ref 4.0–10.5)
nRBC: 0 % (ref 0.0–0.2)

## 2022-12-20 MED ORDER — SODIUM CHLORIDE 0.9 % IV SOLN: Freq: Once | INTRAVENOUS | Status: AC

## 2022-12-20 MED ORDER — SODIUM CHLORIDE 0.9 % IV SOLN
200.0000 mg | Freq: Once | INTRAVENOUS | Status: AC
  Administered 2022-12-20: 200 mg via INTRAVENOUS
  Filled 2022-12-20: qty 200

## 2022-12-20 NOTE — Telephone Encounter (Signed)
Ok to hold aspirin.

## 2022-12-20 NOTE — Telephone Encounter (Signed)
Luis [name] with [facility] called to request cardiac clearance for upcoming procedure (TRANSURETHRAL RESECTION OF BLADDER) ON 12/28/22  Pt will need to hold ASA 5 days prior    Last OV 11/14/22 Last echo 11/24/22  Please advise

## 2022-12-20 NOTE — Patient Instructions (Signed)

## 2022-12-20 NOTE — Progress Notes (Signed)
Sent message, via epic in basket, requesting orders in epic from surgeon.  

## 2022-12-20 NOTE — Progress Notes (Signed)
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 [name]  08/12/2022: Cycle 1 of Pembrolizumab 09/02/2022: Cycle 2 of Pembrolizumab  09/23/2022: Cycle 3 of Pembrolizumab 10/14/2022: Cycle 4 of Pembrolizumab 11/04/2022: Cycle 5 of Pembrolizumab 11/25/2022: Cycle 6 of Pembrolizumab 12/20/2022: Cycle 7 of Pembrolizumab  CHIEF COMPLAINTS/PURPOSE OF CONSULTATION:  "High Risk Non-Muscle Invasive Bladder Cancer "  HISTORY OF PRESENTING ILLNESS:  Luis [name] 67 y.o. male with medical history significant for BCG unresponsive high risk nonmuscle invasive bladder cancer who presents for a follow up before starting Cycle 7 of Pembrolizumab therapy. He is accompanied by his wife for this visit.   On exam today on exam today [name] reports he is tolerating immunotherapy without any new or significant toxicities. He reports his energy and appetite are stable. He has stable urinary symptoms with intermittent episodes of dysuria and hematuria. He denies nausea, vomiting or bowel habit changes. He denies fevers, chills, sweats, shortness of breath, chest pain or cough. He  has no other complaints. A full 10 point ROS is otherwise negative.  MEDICAL HISTORY:  Past Medical History:  Diagnosis Date   Anxiety    takes Valium as needed   Aortic stenosis, moderate    Arthritis    Ascending aortic aneurysm (HCC)    CAD (coronary artery disease)    a. s/p PCI of the RCA 8/12 with DES by Dr Excell Seltzer, preserved EF. b. LHC/RHC (2/16) with mean RA 12, PA 32/15, mean PCWP 18, CI 3.47; patent mid and distal RCA stents, 50-60% proximal stenosis small PDA.      Cancer [facility])    bladder   Carotid stenosis    a. Carotid US (05/2013):  Bilateral 1-39% ICA; L thyroid nodule (prior hx of aspiration).   Chronic diastolic CHF (congestive heart failure) (HCC)    Complication of anesthesia    difficulty waking up after gallbladder surgery   Depression    Dyspnea    Essential hypertension    GERD (gastroesophageal reflux disease)    if needed will take OTC meds    Heart murmur    History of colonic polyps    hyperplastic   Hyperlipidemia    Joint pain    Lesion of bladder    Myocardial infarction (HCC) 2012   Obesity (BMI 30-39.9) 02/29/2016   Pre-diabetes    Restless leg    Sleep apnea    uses cpap   Tubular adenoma of colon    Vertigo    takes Meclizine as needed    SURGICAL HISTORY: Past Surgical History:  Procedure Laterality Date   AORTIC VALVE REPLACEMENT N/A 09/16/2021   Procedure: AORTIC VALVE REPLACEMENT (AVR);  Surgeon: Alleen Borne, MD;  Location: [facility] OR;  Service: Open Heart Surgery;  Laterality: N/A;   CATARACT  EXTRACTION   4 YRS AGO   BOTH EYES   CHOLECYSTECTOMY  07/21/2011   Procedure: LAPAROSCOPIC CHOLECYSTECTOMY WITH INTRAOPERATIVE CHOLANGIOGRAM;  Surgeon: Kandis Cocking, MD;  Location: [facility] ORS;  Service: General;  Laterality: N/A;   CORONARY ANGIOPLASTY  2012   2 stents   coronary stenting     s/p PCI of the RCA by Dr Excell Seltzer 8/12 with 2 promus stents   CYSTOSCOPY W/ RETROGRADES Bilateral 12/13/2019   Procedure: CYSTOSCOPY WITH RETROGRADE  PYELOGRAM;  Surgeon: Sebastian Ache, MD;  Location: [facility];  Service: Urology;  Laterality: Bilateral;   CYSTOSCOPY W/ RETROGRADES Bilateral 10/14/2020   Procedure: CYSTOSCOPY WITH RETROGRADE PYELOGRAM;  Surgeon: Sebastian Ache, MD;  Location: [facility];  Service: Urology;  Laterality: Bilateral;   CYSTOSCOPY W/ RETROGRADES Bilateral 12/16/2020   Procedure: CYSTOSCOPY WITH RETROGRADE PYELOGRAM;  Surgeon: Sebastian Ache, MD;  Location: [facility];  Service: Urology;  Laterality: Bilateral;   CYSTOSCOPY W/ RETROGRADES Bilateral 06/03/2022   Procedure: CYSTOSCOPY WITH RETROGRADE PYELOGRAM;  Surgeon: Sebastian Ache, MD;  Location: [facility]S;  Service: Urology;  Laterality: Bilateral;   LEFT AND RIGHT HEART CATHETERIZATION WITH CORONARY ANGIOGRAM N/A 06/23/2014   Procedure: LEFT AND RIGHT HEART CATHETERIZATION WITH CORONARY ANGIOGRAM;  Surgeon: Laurey Morale, MD;  Location: [facility] CATH LAB;  Service: Cardiovascular;  Laterality: N/A;   NECK SURGERY  03/23/09   per Dr. Ophelia Charter, cervical fusion    PERICARDIOCENTESIS N/A 09/28/2021   Procedure: PERICARDIOCENTESIS;  Surgeon: Corky Crafts, MD;  Location: [facility] INVASIVE CV LAB;  Service: Cardiovascular;  Laterality: N/A;   REPLACEMENT ASCENDING AORTA N/A 09/16/2021   Procedure: REPLACEMENT ASCENDING AORTA WITH 30 X HEMASHIELD PLATINUM WOVEN DOUBLE VELOUR VASCULAR GRAFT;  Surgeon: Alleen Borne, MD;  Location: [facility] OR;  Service: Open Heart Surgery;  Laterality: N/A;  CIRC ARREST   right elbow surgery     RIGHT HEART CATH AND CORONARY ANGIOGRAPHY N/A 07/08/2021   Procedure: RIGHT HEART CATH AND CORONARY ANGIOGRAPHY;  Surgeon: Laurey Morale, MD;  Location: [facility] INVASIVE CV LAB;  Service: Cardiovascular;  Laterality: N/A;   solonscopy  05/23/08   per Dr. Celene Kras hemorrhoids only, repeat in 5 years   SUBXYPHOID PERICARDIAL WINDOW N/A 09/28/2021   Procedure: SUBXYPHOID PERICARDIAL WINDOW;  Surgeon:  Alleen Borne, MD;  Location: [facility] OR;  Service: Thoracic;  Laterality: N/A;   TEE WITHOUT CARDIOVERSION N/A 06/23/2014   Procedure: TRANSESOPHAGEAL ECHOCARDIOGRAM (TEE);  Surgeon: Laurey Morale, MD;  Location: [facility] ENDOSCOPY;  Service: Cardiovascular;  Laterality: N/A;   TEE WITHOUT CARDIOVERSION N/A 01/21/2016   Procedure: TRANSESOPHAGEAL ECHOCARDIOGRAM (TEE);  Surgeon: Laurey Morale, MD;  Location: [facility] ENDOSCOPY;  Service: Cardiovascular;  Laterality: N/A;   TEE WITHOUT CARDIOVERSION N/A 09/16/2021   Procedure: TRANSESOPHAGEAL ECHOCARDIOGRAM (TEE);  Surgeon: Alleen Borne, MD;  Location: [facility] OR;  Service: Open Heart Surgery;  Laterality: N/A;   TEE WITHOUT CARDIOVERSION N/A 09/28/2021   Procedure: TRANSESOPHAGEAL ECHOCARDIOGRAM (TEE);  Surgeon: Alleen Borne, MD;  Location: [facility] OR;  Service: Thoracic;  Laterality: N/A;   TONSILLECTOMY     TRANSURETHRAL RESECTION OF BLADDER TUMOR N/A 10/21/2019   Procedure: TRANSURETHRAL RESECTION OF BLADDER TUMOR (TURBT);  Surgeon: Ihor Gully, MD;  Location: [facility];  Service: Urology;  Laterality: N/A;   TRANSURETHRAL RESECTION OF BLADDER TUMOR N/A 12/13/2019   Procedure: TRANSURETHRAL RESECTION OF BLADDER TUMOR (TURBT);  Surgeon: Sebastian Ache, MD;  Location: [facility];  Service: Urology;  Laterality: N/A;  1 HR   TRANSURETHRAL RESECTION OF BLADDER TUMOR N/A 10/14/2020   Procedure: TRANSURETHRAL RESECTION OF BLADDER TUMOR (TURBT);  Surgeon: Sebastian Ache, MD;  Location: [facility];  Service: Urology;  Laterality: N/A;   TRANSURETHRAL RESECTION OF BLADDER TUMOR N/A 12/16/2020   Procedure: RESTAGING TRANSURETHRAL RESECTION OF BLADDER TUMOR (TURBT);  Surgeon: Sebastian Ache, MD;  Location: [facility];  Service: Urology;  Laterality: N/A;   TRANSURETHRAL RESECTION OF BLADDER TUMOR N/A 06/03/2022   Procedure: TRANSURETHRAL RESECTION OF BLADDER TUMOR (TURBT);  Surgeon: Sebastian Ache, MD;   Location: [facility]S;  Service: Urology;  Laterality: N/A;    SOCIAL HISTORY: Social History   Socioeconomic History   Marital status: Married    Spouse name: Not on file   Number of children: 4   Years of education: Not on file   Highest education level: Not on file  Occupational History   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  Tobacco Use   Smoking status: Former    Current packs/day: 0.00    Average packs/day: 2.0 packs/day for 40.0 years (80.0 ttl pk-yrs)    Types: Cigarettes    Start date: 57    Quit date: 2012    Years since quitting: 12.6   Smokeless tobacco: Never  Vaping Use   Vaping status: Never Used  Substance and Sexual Activity   Alcohol use: No    Alcohol/week: 0.0 standard drinks of alcohol   Drug use: No   Sexual activity: Not on file  Other Topics Concern   Not on file  Social History Narrative   Not on file   Social Determinants of Health   Financial Resource Strain: Low Risk  (11/03/2022)   Overall Financial Resource Strain (CARDIA)    Difficulty of Paying Living Expenses: Not hard at all  Food Insecurity: No Food Insecurity (11/03/2022)   Hunger Vital Sign    Worried About Running Out of Food in the Last Year: Never true    Ran Out of Food in the Last Year: Never true  Transportation Needs: No Transportation Needs (11/03/2022)   PRAPARE - Administrator, Civil Service (Medical): No    Lack of Transportation (Non-Medical): No  Physical Activity: Unknown (11/03/2022)   Exercise Vital Sign    Days of Exercise per Week: Patient unable to answer    Minutes of Exercise per Session: 30 min  Stress: No Stress Concern Present (11/03/2022)   [employer] of Occupational Health - Occupational Stress Questionnaire    Feeling of Stress : Not at all  Social Connections: Moderately Isolated (11/03/2022)   Social Connection and Isolation Panel [NHANES]    Frequency of Communication with Friends and Family: More than  three times a week    Frequency of Social Gatherings with Friends and Family: More than three times a week    Attends Religious Services: Never    Database administrator or Organizations: No    Attends Banker Meetings: Never    Marital Status: Married  Catering manager Violence: Not At Risk (11/03/2022)   Humiliation, Afraid, Rape, and Kick questionnaire    Fear of Current or Ex-Partner: No    Emotionally Abused: No    Physically Abused: No    Sexually Abused: No    FAMILY HISTORY: Family History  Problem Relation Age of Onset   Lung cancer Mother        lung   Esophageal  cancer Cousin    Colon cancer Neg Hx    Rectal cancer Neg Hx    Stomach cancer Neg Hx     ALLERGIES:  has No Known Allergies.  MEDICATIONS:  Current Outpatient Medications  Medication Sig Dispense Refill   acetaminophen (TYLENOL) 500 MG tablet Take 1-2 tablets (500-1,000 mg total) by mouth every 6 (six) hours as needed for moderate pain. 30 tablet 0   aspirin 81 MG tablet Take 81 mg by mouth every morning.     carvedilol (COREG) 3.125 MG tablet TAKE 1 TABLET BY MOUTH TWICE A DAY WITH FOOD 180 tablet 1   diazepam (VALIUM) 5 MG tablet Take 1 tablet (5 mg total) by mouth every 12 (twelve) hours as needed for anxiety. 60 tablet 2   empagliflozin (JARDIANCE) 10 MG TABS tablet Take 1 tablet (10 mg total) by mouth daily before breakfast. 30 tablet 11   Ferrous Sulfate (IRON PO) Take 1 tablet by mouth daily. 1 tab daily     furosemide (LASIX) 20 MG tablet TAKE 60MG  IN THE MORNING AND 40MG  IN THE EVENING 150 tablet 3   ibuprofen (ADVIL) 200 MG tablet Take by mouth.     ipratropium (ATROVENT) 0.03 % nasal spray Place 2 sprays into both nostrils every 12 (twelve) hours as needed for rhinitis.     KLOR-CON M20 20 MEQ tablet TAKE 2 TABLETS BY MOUTH EVERY DAY 180 tablet 1   Magnesium 500 MG TABS Take 500 mg by mouth daily.     meclizine (ANTIVERT) 25 MG tablet Take 1 tablet (25 mg total) by mouth 3 (three)  times daily as needed for dizziness. 30 tablet 0   multivitamin-iron-minerals-folic acid (CENTRUM) chewable tablet Chew 1 tablet by mouth daily.     nitroGLYCERIN (NITROSTAT) 0.4 MG SL tablet Place under the tongue.     phenazopyridine (PYRIDIUM) 200 MG tablet Take 200 mg by mouth every 8 (eight) hours as needed. AZO     rosuvastatin (CRESTOR) 20 MG tablet TAKE 1 TABLET BY MOUTH EVERYDAY AT BEDTIME 90 tablet 3   tamsulosin (FLOMAX) 0.4 MG CAPS capsule Take 0.4 mg by mouth daily.     venlafaxine XR (EFFEXOR-XR) 150 MG 24 hr capsule TAKE 1 CAPSULE BY MOUTH DAILY WITH BREAKFAST. 90 capsule 0   No current facility-administered medications for this visit.    REVIEW OF SYSTEMS:   Constitutional: ( - ) fevers, ( - )  chills , ( - ) night sweats Eyes: ( - ) blurriness of vision, ( - ) double vision, ( - ) watery eyes Ears, nose, mouth, throat, and face: ( - ) mucositis, ( - ) sore throat Respiratory: ( - ) cough, ( - ) dyspnea, ( - ) wheezes Cardiovascular: ( - ) palpitation, ( - ) chest discomfort, ( - ) lower extremity swelling Gastrointestinal:  ( - ) nausea, ( - ) heartburn, ( - ) change in bowel habits Skin: ( - ) abnormal skin rashes Lymphatics: ( - ) new lymphadenopathy, ( - ) easy bruising Neurological: ( - ) numbness, ( - ) tingling, ( - ) new weaknesses Behavioral/Psych: ( - ) mood change, ( - ) new changes  All other systems were reviewed with the patient and are negative.  PHYSICAL EXAMINATION: ECOG PERFORMANCE STATUS: 1 - Symptomatic but completely ambulatory  Vitals:   12/20/22 1342  BP: (!) 155/72  Pulse: 68  Resp: 18  Temp: 97.9 F (36.6 C)  SpO2: 95%      Filed Weights  12/20/22 1342  Weight: 282 lb 9.6 oz (128.2 kg)    GENERAL: well appearing elderly Caucasian male in NAD  SKIN: skin color, texture, turgor are normal, no rashes or significant lesions EYES: conjunctiva are pink and non-injected, sclera clear LUNGS: clear to auscultation and percussion with  normal breathing effort HEART: regular rate & rhythm and no murmurs and no lower extremity edema Musculoskeletal: no cyanosis of digits and no clubbing  PSYCH: alert & oriented x 3, fluent speech NEURO: no focal motor/sensory deficits  LABORATORY DATA:  I have reviewed the data as listed    Latest Ref Rng & Units 12/20/2022   12:59 PM 11/25/2022    9:11 AM 11/04/2022    7:54 AM  CBC  WBC 4.0 - 10.5 K/uL 6.8  7.7  6.8   Hemoglobin 13.0 - 17.0 g/dL 16.1  09.6  04.5   Hematocrit 39.0 - 52.0 % 43.7  42.9  43.9   Platelets 150 - 400 K/uL 148  151  150        Latest Ref Rng & Units 12/20/2022   12:59 PM 11/25/2022    9:11 AM 11/15/2022    1:33 PM  CMP  Glucose 70 - 99 mg/dL 409  811  914   BUN 8 - 23 mg/dL 23  21  21    Creatinine 0.61 - 1.24 mg/dL 7.82  9.56  2.13   Sodium 135 - 145 mmol/L 139  140  137   Potassium 3.5 - 5.1 mmol/L 4.0  3.8  3.8   Chloride 98 - 111 mmol/L 103  105  102   CO2 22 - 32 mmol/L 28  28  24    Calcium 8.9 - 10.3 mg/dL 8.9  8.7  8.8   Total Protein 6.5 - 8.1 g/dL 7.1  7.1    Total Bilirubin 0.3 - 1.2 mg/dL 0.8  0.8    Alkaline Phos 38 - 126 U/L 83  89    AST 15 - 41 U/L 24  24    ALT 0 - 44 U/L 25  28      RADIOGRAPHIC STUDIES: CT ANGIO CHEST AORTA W/CM & OR WO/CM  Result Date: 12/14/2022 CLINICAL DATA:  Follow-up ascending aortic aneurysm EXAM: CT ANGIOGRAPHY CHEST WITH CONTRAST TECHNIQUE: Multidetector CT imaging of the chest was performed using the standard protocol during bolus administration of intravenous contrast. Multiplanar CT image reconstructions and MIPs were obtained to evaluate the vascular anatomy. RADIATION DOSE REDUCTION: This exam was performed according to the departmental dose-optimization program which includes automated exposure control, adjustment of the mA and/or kV according to patient size and/or use of iterative reconstruction technique. CONTRAST:  75mL ISOVUE-370 IOPAMIDOL (ISOVUE-370) INJECTION 76% COMPARISON:  CTA chest dated September 28, 2021 FINDINGS: Cardiovascular: Prior aortic valve replacement and graft repair of the ascending thoracic aorta. No evidence of pseudoaneurysm. Normal caliber aortic arch and descending thoracic aorta with mild atherosclerotic disease. Severe left main and three-vessel coronary artery calcifications. Normal heart size. No pericardial effusion. Mediastinum/Nodes: Esophagus and thyroid are unremarkable. No enlarged lymph nodes seen in the chest. Lungs/Pleura: Central airways are patent. No consolidation, pleural effusion or pneumothorax. Upper Abdomen: No acute abnormality. Musculoskeletal: Prior median sternotomy. No aggressive appearing osseous lesions. Review of the MIP images confirms the above findings. IMPRESSION: 1. Prior aortic valve replacement and graft repair of the ascending thoracic aorta. No evidence of pseudoaneurysm. 2. Coronary artery calcifications and aortic Atherosclerosis (ICD10-I70.0). Electronically Signed   By: Aliene Altes.D.  On: 12/14/2022 15:26   ECHOCARDIOGRAM COMPLETE  Result Date: 11/24/2022    ECHOCARDIOGRAM REPORT   Patient Name:   Luis [name] Date of Exam: 11/24/2022 Medical Rec #:  [id number]        Height:       66.0 in Accession #:    [id number]       Weight:       285.0 lb Date of Birth:  [birth year]        BSA:          2.325 m Patient Age:    66 years         BP:           110/70 mmHg Patient Gender: M                HR:           60 bpm. Exam Location:  Outpatient Procedure: 2D Echo, Cardiac Doppler, Color Doppler and Intracardiac            Opacification Agent Indications:    Congestive Heart Failure I50.9  History:        Patient has prior history of Echocardiogram examinations, most                 recent 10/29/2021. CHF, CAD and Previous Myocardial Infarction,                 Aortic Valve Disease, Signs/Symptoms:Dyspnea; Risk                 Factors:Hypertension, Dyslipidemia and Sleep Apnea. Ascending                 Aortic Aneurysm.                 Aortic  Valve: 25 mm Edwards Inspiris Resilia valve is present in                 the aortic position. Procedure Date: 09/16/21.  Sonographer:    Eulah Pont RDCS Referring Phys: [id number] MCLEAN IMPRESSIONS  1. Left ventricular ejection fraction, by estimation, is 60 to 65%. The left ventricle has normal function. The left ventricle has no regional wall motion abnormalities. There is mild left ventricular hypertrophy. Left ventricular diastolic parameters were normal.  2. Right ventricular systolic function is normal. The right ventricular size is normal. Tricuspid regurgitation signal is inadequate for assessing PA pressure.  3. The mitral valve is normal in structure. No evidence of mitral valve regurgitation.  4. No significant PVL.[name] The aortic valve was not well visualized. Aortic valve regurgitation is not visualized. There is a 25 mm Edwards Inspiris Resilia valve present in the aortic position. Procedure Date: 09/16/21.  5. The inferior vena cava is normal in size with greater than 50% respiratory variability, suggesting right atrial pressure of 3 mmHg. FINDINGS  Left Ventricle: Left ventricular ejection fraction, by estimation, is 60 to 65%. The left ventricle has normal function. The left ventricle has no regional wall motion abnormalities. Definity contrast agent was given IV to delineate the left ventricular  endocardial borders. The left ventricular internal cavity size was normal in size. There is mild left ventricular hypertrophy. Left ventricular diastolic parameters were normal. Right Ventricle: The right ventricular size is normal. Right ventricular systolic function is normal. Tricuspid regurgitation signal is inadequate for assessing PA pressure. Left Atrium: Left atrial size was normal in size. Right Atrium: Right atrial size was normal in size. Pericardium: There is no  evidence of pericardial effusion. Mitral Valve: The mitral valve is normal in structure. No evidence of mitral valve  regurgitation. Tricuspid Valve: Tricuspid valve regurgitation is not demonstrated. Aortic Valve: No significant PVL. The aortic valve was not well visualized. Aortic valve regurgitation is not visualized. Aortic valve mean gradient measures 17.0 mmHg. Aortic valve peak gradient measures 43.9 mmHg. Aortic valve area, by VTI measures 2.24 cm. There is a 25 mm Edwards Inspiris Resilia valve present in the aortic position. Procedure Date: 09/16/21. Pulmonic Valve: The pulmonic valve was normal in structure. Pulmonic valve regurgitation is not visualized. Aorta: The aortic root and ascending aorta are structurally normal, with no evidence of dilitation. Venous: The inferior vena cava is normal in size with greater than 50% respiratory variability, suggesting right atrial pressure of 3 mmHg. IAS/Shunts: The interatrial septum was not well visualized.  LEFT VENTRICLE PLAX 2D LVIDd:         4.70 cm      Diastology LVIDs:         2.80 cm      LV e' medial:    8.73 cm/s LV PW:         1.10 cm      LV E/e' medial:  12.0 LV IVS:        1.10 cm      LV e' lateral:   11.50 cm/s LVOT diam:     2.50 cm      LV E/e' lateral: 9.1 LV SV:         145 LV SV Index:   62 LVOT Area:     4.91 cm  LV Volumes (MOD) LV vol d, MOD A2C: 106.0 ml LV vol d, MOD A4C: 135.0 ml LV vol s, MOD A2C: 29.1 ml LV vol s, MOD A4C: 39.0 ml LV SV MOD A2C:     76.9 ml LV SV MOD A4C:     135.0 ml LV SV MOD BP:      88.2 ml RIGHT VENTRICLE RV S prime:     9.53 cm/s TAPSE (M-mode): 1.8 cm LEFT ATRIUM             Index        RIGHT ATRIUM           Index LA diam:        4.80 cm 2.06 cm/m   RA Area:     22.00 cm LA Vol (A2C):   44.0 ml 18.93 ml/m  RA Volume:   64.40 ml  27.70 ml/m LA Vol (A4C):   52.8 ml 22.71 ml/m LA Biplane Vol: 51.6 ml 22.20 ml/m  AORTIC VALVE AV Area (Vmax):    2.07 cm AV Area (Vmean):   2.47 cm AV Area (VTI):     2.24 cm AV Vmax:           331.33 cm/s AV Vmean:          182.000 cm/s AV VTI:            0.646 m AV Peak Grad:      43.9  mmHg AV Mean Grad:      17.0 mmHg LVOT Vmax:         140.00 cm/s LVOT Vmean:        91.500 cm/s LVOT VTI:          0.295 m LVOT/AV VTI ratio: 0.46  AORTA Ao Root diam: 3.80 cm Ao Asc diam:  3.40 cm MITRAL VALVE MV Area (PHT): 3.21 cm  SHUNTS MV Decel Time: 236 msec     Systemic VTI:  0.30 m MV E velocity: 105.00 cm/s  Systemic Diam: 2.50 cm MV A velocity: 95.60 cm/s MV E/A ratio:  1.10 Carolan Clines Electronically signed by Carolan Clines Signature Date/Time: 11/24/2022/1:36:45 PM    Final     ASSESSMENT & PLAN Luis [name] 67 y.o. 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  After review of the labs, review of the records, and discussion with the patient the patients findings are most consistent with BCG unresponsive high risk non-muscle invasive bladder cancer, not a candidate for cystectomy.  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 21 days until progression or intolerance up to 24 months. --Started Cycle 1 of Pembrolizumab on 08/12/2022.  PLAN: --Due to start Cycle 7 of Pembrolizumab therapy today --Labs from today were reviewed and adequate for treatment. WBC 6.8, hemoglobin 14.4, MCV 91.2, and platelets 148. Creatinine is 1.32, LFTs normal. --Proceed with treatment today without any modifications --Patient is scheduled for CT imaging tomorrow at [facility] and TURBT on 12/28/2022. We will review CT imaging and determine if treatment changes need to be made.  --RTC in 3 weeks with labs and follow up visit before Cycle 8  # Abdominal Rash--improved -- Small circular rash, approximately 3 inches in diameter. -- Patient has access to hydrocortisone 5% at home, recommend he apply that to the lesion 1-2 times daily to see if there is improvement.  #Urinary frequency and hematuria: --  Defer to urology for further evaluation and management  #Supportive Care -- chemotherapy education to be scheduled  -- port placement not required  -- no pain medication required at this time.   No orders of the defined types were placed in this encounter.  All questions were answered. The patient knows to call the clinic with any problems, questions or concerns.  I have spent a total of 30 minutes minutes of face-to-face and non-face-to-face time, preparing to see the patient, performing a medically appropriate examination, counseling and educating the patient, documenting clinical information in the electronic health record,and care coordination.   [name] PA-C Dept of Hematology and Oncology [facility] Phone: [phone]   12/20/2022 1:52 PM  Literature Support:  Pembrolizumab monotherapy for the treatment of high-risk non-muscle-invasive bladder cancer unresponsive to BCG [facility]): an open-label, single-arm, multicentre, phase 2 study,The Lancet Oncology,Volume 22, Issue 7,2021,Pages 919-930,ISSN 7846-9629  -- Pembrolizumab monotherapy was tolerable and showed promising antitumour activity in patients with BCG-unresponsive non-muscle-invasive bladder cancer who declined or were ineligible for radical cystectomy and should be considered a a clinically active non-surgical treatment option in this difficult-to-treat population.  --All enrolled patients were assigned to receive pembrolizumab 200 mg intravenously every 3 weeks for up to 24 months or until centrally confirmed disease persistence, recurrence, or progression; unacceptable toxic effects

## 2022-12-21 DIAGNOSIS — C678 Malignant neoplasm of overlapping sites of bladder: Secondary | ICD-10-CM | POA: Diagnosis not present

## 2022-12-21 DIAGNOSIS — R160 Hepatomegaly, not elsewhere classified: Secondary | ICD-10-CM | POA: Diagnosis not present

## 2022-12-21 DIAGNOSIS — N289 Disorder of kidney and ureter, unspecified: Secondary | ICD-10-CM | POA: Diagnosis not present

## 2022-12-21 DIAGNOSIS — C679 Malignant neoplasm of bladder, unspecified: Secondary | ICD-10-CM | POA: Diagnosis not present

## 2022-12-21 NOTE — Telephone Encounter (Signed)
Fully noted however staff from [facility] (Jessica)requested additional "note of clearance" for anesthesia purposes.     Shanda Bumps with [facility] aware

## 2022-12-21 NOTE — Telephone Encounter (Signed)
@  [facility]  Please include cardiac clearance for procedure  Medication prep orders were received however needs medical clearance as well

## 2022-12-21 NOTE — Progress Notes (Signed)
COVID Vaccine Completed: yes  Date of COVID positive in last 90 days:  PCP - Gershon Crane, MD Cardiologist - Marca Ancona, MD Advanced Heart Failure- Marca Ancona, MD  Chest x-ray -  EKG - 11/14/22 Epic Stress Test - 12/18/15 Epic ECHO - 11/24/22 Epic Cardiac Cath - 07/08/21 Epic Pacemaker/ICD device last checked: Spinal Cord Stimulator:  Bowel Prep -   Sleep Study -  CPAP -   Fasting Blood Sugar -  Checks Blood Sugar _____ times a day  Last dose of GLP1 agonist-  N/A GLP1 instructions:  N/A   Last dose of SGLT-2 inhibitors-  N/A SGLT-2 instructions: N/A   Blood Thinner Instructions:  Time Aspirin Instructions: ASA 81, hold  5 days Last Dose:  Activity level:  Can go up a flight of stairs and perform activities of daily living without stopping and without symptoms of chest pain or shortness of breath.  Able to exercise without symptoms  Unable to go up a flight of stairs without symptoms of     Anesthesia review: carotidartery stenosis, CAD, CHF, HTN, OSA  Patient denies shortness of breath, fever, cough and chest pain at PAT appointment  Patient verbalized understanding of instructions that were given to them at the PAT appointment. Patient was also instructed that they will need to review over the PAT instructions again at home before surgery.

## 2022-12-21 NOTE — Telephone Encounter (Signed)
Detailed message left for [place] [phone]

## 2022-12-22 ENCOUNTER — Other Ambulatory Visit: Payer: Self-pay

## 2022-12-22 ENCOUNTER — Encounter (HOSPITAL_COMMUNITY): Payer: Self-pay

## 2022-12-22 ENCOUNTER — Encounter (HOSPITAL_COMMUNITY)
Admission: RE | Admit: 2022-12-22 | Discharge: 2022-12-22 | Disposition: A | Discharge status: Home / Self Care | Payer: PPO | Source: Ambulatory Visit | Attending: Urology | Admitting: Urology

## 2022-12-22 DIAGNOSIS — Z79899 Other long term (current) drug therapy: Secondary | ICD-10-CM | POA: Insufficient documentation

## 2022-12-22 DIAGNOSIS — Z01812 Encounter for preprocedural laboratory examination: Secondary | ICD-10-CM | POA: Insufficient documentation

## 2022-12-22 DIAGNOSIS — Z01818 Encounter for other preprocedural examination: Secondary | ICD-10-CM | POA: Diagnosis present

## 2022-12-22 DIAGNOSIS — C679 Malignant neoplasm of bladder, unspecified: Secondary | ICD-10-CM | POA: Insufficient documentation

## 2022-12-22 NOTE — Patient Instructions (Addendum)
SURGICAL WAITING ROOM VISITATION  Patients having surgery or a procedure may have no more than 2 support people in the waiting area - these visitors may rotate.    Children under the age of 59 must have an adult with them who is not the patient.  Due to an increase in RSV and influenza rates and associated hospitalizations, children ages 47 and under may not visit patients in [facility] hospitals.  If the patient needs to stay at the hospital during part of their recovery, the visitor guidelines for inpatient rooms apply. Pre-op nurse will coordinate an appropriate time for 1 support person to accompany patient in pre-op.  This support person may not rotate.    Please refer to the [facility] website for the visitor guidelines for Inpatients (after your surgery is over and you are in a regular room).    Your procedure is scheduled on: 12/28/22   Report to [facility] Main Entrance    Report to admitting at 9:15 AM   Call this number if you have problems the morning of surgery [phone]   Do not eat food or drink liquids :After Midnight.          If you have questions, please contact your surgeon's office.   FOLLOW BOWEL PREP AND ANY ADDITIONAL PRE OP INSTRUCTIONS YOU RECEIVED FROM YOUR SURGEON'S OFFICE!!!     Oral Hygiene is also important to reduce your risk of infection.                                    Remember - BRUSH YOUR TEETH THE MORNING OF SURGERY WITH YOUR REGULAR TOOTHPASTE  DENTURES WILL BE REMOVED PRIOR TO SURGERY PLEASE DO NOT APPLY "Poly grip" OR ADHESIVES!!!   Stop all vitamins and herbal supplements 7 days before surgery.   Hold Jardiance 3 days prior to surgery. Last dose 12/24/22.   Take these medicines the morning of surgery with A SIP OF WATER: Tylenol, Carvedilol, Diazepam, Meclizine, Pyridum, Tamsulosin, Venlafaxine  Bring CPAP mask and tubing day of surgery.                              You may not have any metal on your body including  jewelry, and body piercing             Do not wear make-up, lotions, powders, cologne, or deodorant              Men may shave face and neck.   Do not bring valuables to the hospital. [facility] IS NOT             RESPONSIBLE   FOR VALUABLES.   Contacts, glasses, dentures or bridgework may not be worn into surgery.  DO NOT BRING YOUR HOME MEDICATIONS TO THE HOSPITAL. PHARMACY WILL DISPENSE MEDICATIONS LISTED ON YOUR MEDICATION LIST TO YOU DURING YOUR ADMISSION IN THE HOSPITAL!    Patients discharged on the day of surgery will not be allowed to drive home.  Someone NEEDS to stay with you for the first 24 hours after anesthesia.              Please read over the following fact sheets you were given: IF YOU HAVE QUESTIONS ABOUT YOUR PRE-OP INSTRUCTIONS PLEASE CALL [name]   If you received a COVID test during your pre-op visit  it  is requested that you wear a mask when out in public, stay away from anyone that may not be feeling well and notify your surgeon if you develop symptoms. If you test positive for Covid or have been in contact with anyone that has tested positive in the last 10 days please notify you surgeon.    Index - Preparing for Surgery Before surgery, you can play an important role.  Because skin is not sterile, your skin needs to be as free of germs as possible.  You can reduce the number of germs on your skin by washing with CHG (chlorahexidine gluconate) soap before surgery.  CHG is an antiseptic cleaner which kills germs and bonds with the skin to continue killing germs even after washing. Please DO NOT use if you have an allergy to CHG or antibacterial soaps.  If your skin becomes reddened/irritated stop using the CHG and inform your nurse when you arrive at Short Stay. Do not shave (including legs and underarms) for at least 48 hours prior to the first CHG shower.  You may shave your face/neck.  Please follow these instructions carefully:  1.  Shower  with CHG Soap the night before surgery and the  morning of surgery.  2.  If you choose to wash your hair, wash your hair first as usual with your normal  shampoo.  3.  After you shampoo, rinse your hair and body thoroughly to remove the shampoo.                             4.  Use CHG as you would any other liquid soap.  You can apply chg directly to the skin and wash.  Gently with a scrungie or clean washcloth.  5.  Apply the CHG Soap to your body ONLY FROM THE NECK DOWN.   Do   not use on face/ open                           Wound or open sores. Avoid contact with eyes, ears mouth and   genitals (private parts).                       Wash face,  Genitals (private parts) with your normal soap.             6.  Wash thoroughly, paying special attention to the area where your    surgery  will be performed.  7.  Thoroughly rinse your body with warm water from the neck down.  8.  DO NOT shower/wash with your normal soap after using and rinsing off the CHG Soap.                9.  Pat yourself dry with a clean towel.            10.  Wear clean pajamas.            11.  Place clean sheets on your bed the night of your first shower and do not  sleep with pets. Day of Surgery : Do not apply any lotions/deodorants the morning of surgery.  Please wear clean clothes to the hospital/surgery center.  FAILURE TO FOLLOW THESE INSTRUCTIONS MAY RESULT IN THE CANCELLATION OF YOUR SURGERY  PATIENT SIGNATURE_________________________________  NURSE SIGNATURE__________________________________  ________________________________________________________________________

## 2022-12-23 NOTE — Progress Notes (Signed)
Anesthesia Chart Review   Case: [id number] Date/Time: 12/28/22 1115   Procedures:      TRANSURETHRAL RESECTION OF BLADDER TUMOR (TURBT) - 75 MINUTES     CYSTOSCOPY WITH RETROGRADE PYELOGRAM (Bilateral)   Anesthesia type: General   Pre-op diagnosis: RECURRENT BLADDER CANCER   [facility]   Surgeons: Loletta Parish., MD       DISCUSSION:66 y.o. former smoker with h/o sleep apnea, CAD (PCI to RCA in 8/12), CHF, a/p AV replacement 08/2021, S/p replacement of ascending aorta 5/23, post operative a-fib, recurrent bladder cancer scheduled for above procedure 12/28/2022 with Dr. Sebastian Ache.   Patient last seen by cardiothoracic surgeon 12/14/2022.  Per office visit note patient is doing well from a cardiac standpoint.  Echo with normal appearing aortic valve prosthesis.  CTA chest with stable repair of AAA.  Patient last seen by primary cardiology 11/14/2022.  Per office visit note patient asymptomatic, BP control.  Per Dr. Shirlee Latch ok to hold ASA 5 days prior to procedure.  VS: BP (!) 145/74   Pulse 60   Temp 36.7 C (Oral)   Resp 18   Ht 5\' 6"  (1.676 m)   Wt 127.5 kg   SpO2 96%   BMI 45.35 kg/m   PROVIDERS: Nelwyn Salisbury, MD is PCP   Cardiologist - Marca Ancona, MD   Cardiothoracic - Evelene Croon, MD LABS: Labs reviewed: Acceptable for surgery. (all labs ordered are listed, but only abnormal results are displayed)  Labs Reviewed - No data to display   IMAGES:   EKG:   CV: Echo 11/24/2022 1. Left ventricular ejection fraction, by estimation, is 60 to 65%. The  left ventricle has normal function. The left ventricle has no regional  wall motion abnormalities. There is mild left ventricular hypertrophy.  Left ventricular diastolic parameters  were normal.   2. Right ventricular systolic function is normal. The right ventricular  size is normal. Tricuspid regurgitation signal is inadequate for assessing  PA pressure.   3. The mitral valve is  normal in structure. No evidence of mitral valve  regurgitation.   4. No significant PVL.[name] The aortic valve was not well visualized. Aortic  valve regurgitation is not visualized. There is a 25 mm Edwards Inspiris  Resilia valve present in the aortic position. Procedure Date: 09/16/21.   5. The inferior vena cava is normal in size with greater than 50%  respiratory variability, suggesting right atrial pressure of 3 mmHg.  Past Medical History:  Diagnosis Date   Anxiety    takes Valium as needed   Aortic stenosis, moderate    Arthritis    Ascending aortic aneurysm (HCC)    CAD (coronary artery disease)    a. s/p PCI of the RCA 8/12 with DES by Dr Excell Seltzer, preserved EF. b. LHC/RHC (2/16) with mean RA 12, PA 32/15, mean PCWP 18, CI 3.47; patent mid and distal RCA stents, 50-60% proximal stenosis small PDA.      Cancer [facility])    bladder   Carotid stenosis    a. Carotid US (05/2013):  Bilateral 1-39% ICA; L thyroid nodule (prior hx of aspiration).   Chronic diastolic CHF (congestive heart failure) (HCC)    Complication of anesthesia    difficulty waking up after gallbladder surgery   Depression    Dyspnea    Essential hypertension    GERD (gastroesophageal reflux disease)    if needed will take OTC meds    Heart murmur  History of colonic polyps    hyperplastic   Hyperlipidemia    Joint pain    Lesion of bladder    Myocardial infarction (HCC) 2012   Obesity (BMI 30-39.9) 02/29/2016   Pre-diabetes    Restless leg    Sleep apnea    uses cpap   Tubular adenoma of colon    Vertigo    takes Meclizine as needed    Past Surgical History:  Procedure Laterality Date   AORTIC VALVE REPLACEMENT N/A 09/16/2021   Procedure: AORTIC VALVE REPLACEMENT (AVR);  Surgeon: Alleen Borne, MD;  Location: [facility] OR;  Service: Open Heart Surgery;  Laterality: N/A;   CATARACT EXTRACTION   4 YRS AGO   BOTH EYES   CHOLECYSTECTOMY  07/21/2011   Procedure: LAPAROSCOPIC CHOLECYSTECTOMY WITH INTRAOPERATIVE  CHOLANGIOGRAM;  Surgeon: Kandis Cocking, MD;  [facility];  Service: General;  Laterality: N/A;   CORONARY ANGIOPLASTY  2012   2 stents   coronary stenting     s/p PCI of the RCA by Dr Excell Seltzer 8/12 with 2 promus stents   CYSTOSCOPY W/ RETROGRADES Bilateral 12/13/2019   Procedure: CYSTOSCOPY WITH RETROGRADE PYELOGRAM;  Surgeon: Sebastian Ache, MD;  Location: [facility];  Service: Urology;  Laterality: Bilateral;   CYSTOSCOPY W/ RETROGRADES Bilateral 10/14/2020   Procedure: CYSTOSCOPY WITH RETROGRADE PYELOGRAM;  Surgeon: Sebastian Ache, MD;  Location: [facility];  Service: Urology;  Laterality: Bilateral;   CYSTOSCOPY W/ RETROGRADES Bilateral 12/16/2020   Procedure: CYSTOSCOPY WITH RETROGRADE PYELOGRAM;  Surgeon: Sebastian Ache, MD;  Location: [facility];  Service: Urology;  Laterality: Bilateral;   CYSTOSCOPY W/ RETROGRADES Bilateral 06/03/2022   Procedure: CYSTOSCOPY WITH RETROGRADE PYELOGRAM;  Surgeon: Sebastian Ache, MD;  [facility];  Service: Urology;  Laterality: Bilateral;   LEFT AND RIGHT HEART CATHETERIZATION WITH CORONARY ANGIOGRAM N/A 06/23/2014   Procedure: LEFT AND RIGHT HEART CATHETERIZATION WITH CORONARY ANGIOGRAM;  Surgeon: Laurey Morale, MD;  Location[facility] C[facility] LAB;  Service: Cardiovascular;  Laterality: N/A;   NECK SURGERY  03/23/09   per Dr. Ophelia Charter, cervical fusion    PERICARDIOCENTESIS N/A 09/28/2021   Procedure: PERICARDIOCENTESIS;  Surgeon: Corky Crafts, MD;  Location: [facility] INVASIVE CV LAB;  Service: Cardiovascular;  Laterality: N/A;   REPLACEMENT ASCENDING AORTA N/A 09/16/2021   Procedure: REPLACEMENT ASCENDING AORTA WITH 30 X HEMASHIELD PLATINUM WOVEN DOUBLE VELOUR VASCULAR GRAFT;  Surgeon: Alleen Borne, MD;  Location: [facility];  Service: Open Heart Surgery;  Laterality: N/A;  CIRC ARREST   right elbow surgery     RIGHT HEART CATH AND CORONARY ANGIOGRAPHY N/A 07/08/2021   Procedure: RIGHT HEART CATH AND  CORONARY ANGIOGRAPHY;  Surgeon: Laurey Morale, MD;  Location: [facility] INVASIVE CV LAB;  Service: Cardiovascular;  Laterality: N/A;   solonscopy  05/23/08   per Dr. Celene Kras hemorrhoids only, repeat in 5 years   SUBXYPHOID PERICARDIAL WINDOW N/A 09/28/2021   Procedure: SUBXYPHOID PERICARDIAL WINDOW;  Surgeon: Alleen Borne, MD;  Location: [facility] OR;  Service: Thoracic;  Laterality: N/A;   TEE WITHOUT CARDIOVERSION N/A 06/23/2014   Procedure: TRANSESOPHAGEAL ECHOCARDIOGRAM (TEE);  Surgeon: Laurey Morale, MD;  Location: [facility] ENDOSCOPY;  Service: Cardiovascular;  Laterality: N/A;   TEE WITHOUT CARDIOVERSION N/A 01/21/2016   Procedure: TRANSESOPHAGEAL ECHOCARDIOGRAM (TEE);  Surgeon: Laurey Morale, MD;  Location: [facility] ENDOSCOPY;  Service: Cardiovascular;  Laterality: N/A;   TEE WITHOUT CARDIOVERSION N/A 09/16/2021   Procedure: TRANSESOPHAGEAL ECHOCARDIOGRAM (TEE);  Surgeon: Alleen Borne,  MD;  Location: [facility] OR;  Service: Open Heart Surgery;  Laterality: N/A;   TEE WITHOUT CARDIOVERSION N/A 09/28/2021   Procedure: TRANSESOPHAGEAL ECHOCARDIOGRAM (TEE);  Surgeon: Alleen Borne, MD;  Location: [facility] OR;  Service: Thoracic;  Laterality: N/A;   TONSILLECTOMY     TRANSURETHRAL RESECTION OF BLADDER TUMOR N/A 10/21/2019   Procedure: TRANSURETHRAL RESECTION OF BLADDER TUMOR (TURBT);  Surgeon: Ihor Gully, MD;  Location: [facility];  Service: Urology;  Laterality: N/A;   TRANSURETHRAL RESECTION OF BLADDER TUMOR N/A 12/13/2019   Procedure: TRANSURETHRAL RESECTION OF BLADDER TUMOR (TURBT);  Surgeon: Sebastian Ache, MD;  Location: [facility];  Service: Urology;  Laterality: N/A;  1 HR   TRANSURETHRAL RESECTION OF BLADDER TUMOR N/A 10/14/2020   Procedure: TRANSURETHRAL RESECTION OF BLADDER TUMOR (TURBT);  Surgeon: Sebastian Ache, MD;  Location: [facility];  Service: Urology;  Laterality: N/A;   TRANSURETHRAL RESECTION OF BLADDER TUMOR N/A 12/16/2020   Procedure:  RESTAGING TRANSURETHRAL RESECTION OF BLADDER TUMOR (TURBT);  Surgeon: Sebastian Ache, MD;  Location: [facility];  Service: Urology;  Laterality: N/A;   TRANSURETHRAL RESECTION OF BLADDER TUMOR N/A 06/03/2022   Procedure: TRANSURETHRAL RESECTION OF BLADDER TUMOR (TURBT);  Surgeon: Sebastian Ache, MD;  [facility];  Service: Urology;  Laterality: N/A;    MEDICATIONS:  acetaminophen (TYLENOL) 500 MG tablet   aspirin 81 MG tablet   carvedilol (COREG) 3.125 MG tablet   diazepam (VALIUM) 5 MG tablet   empagliflozin (JARDIANCE) 10 MG TABS tablet   Ferrous Sulfate (IRON PO)   furosemide (LASIX) 20 MG tablet   ibuprofen (ADVIL) 200 MG tablet   ipratropium (ATROVENT) 0.03 % nasal spray   KLOR-CON M20 20 MEQ tablet   Magnesium 500 MG TABS   meclizine (ANTIVERT) 25 MG tablet   multivitamin-iron-minerals-folic acid (CENTRUM) chewable tablet   nitroGLYCERIN (NITROSTAT) 0.4 MG SL tablet   phenazopyridine (PYRIDIUM) 200 MG tablet   rosuvastatin (CRESTOR) 20 MG tablet   tamsulosin (FLOMAX) 0.4 MG CAPS capsule   venlafaxine XR (EFFEXOR-XR) 150 MG 24 hr capsule   No current facility-administered medications for this encounter.     Jodell Cipro Ward, PA-C [facility]Surgical Testing [phone]

## 2022-12-27 NOTE — Anesthesia Preprocedure Evaluation (Addendum)
Anesthesia Evaluation  Patient identified by MRN, date of birth, ID band  Reviewed: Allergy & Precautions, NPO status , Patient's Chart, lab work & pertinent test results  Airway Mallampati: II  TM Distance: >3 FB Neck ROM: Full    Dental no notable dental hx. (+) Upper Dentures, Lower Dentures   Pulmonary sleep apnea and Continuous Positive Airway Pressure Ventilation , former smoker   Pulmonary exam normal breath sounds clear to auscultation       Cardiovascular hypertension, + CAD (stents 2012), + Past MI (2012) and +CHF  Normal cardiovascular exam+ Valvular Problems/Murmurs (s/p AVR) AS  Rhythm:Regular Rate:Normal  Echo 11/24/2022 1. Left ventricular ejection fraction, by estimation, is 60 to 65%. The  left ventricle has normal function. The left ventricle has no regional  wall motion abnormalities. There is mild left ventricular hypertrophy.  Left ventricular diastolic parameters  were normal.   2. Right ventricular systolic function is normal. The right ventricular  size is normal. Tricuspid regurgitation signal is inadequate for assessing  PA pressure.   3. The mitral valve is normal in structure. No evidence of mitral valve  regurgitation.   4. No significant PVL.[name] The aortic valve was not well visualized. Aortic  valve regurgitation is not visualized. There is a 25 mm Edwards Inspiris  Resilia valve present in the aortic position. Procedure Date: 09/16/21.   5. The inferior vena cava is normal in size with greater than 50%  respiratory variability, suggesting right atrial pressure of 3 mmHg    Neuro/Psych  PSYCHIATRIC DISORDERS Anxiety Depression       GI/Hepatic ,GERD  ,,  Endo/Other    Renal/GU    Recurrent Bladder CA    Musculoskeletal   Abdominal  (+) + obese (BMI 45.3)  Peds  Hematology   Anesthesia Other Findings   Reproductive/Obstetrics                               Anesthesia Physical Anesthesia Plan  ASA: 3  Anesthesia Plan: General   Post-op Pain Management: Tylenol PO (pre-op)*   Induction: Intravenous  PONV Risk Score and Plan: Treatment may vary due to age or medical condition, Midazolam and Ondansetron  Airway Management Planned: Oral ETT  Additional Equipment: None  Intra-op Plan:   Post-operative Plan: Extubation in OR  Informed Consent: I have reviewed the patients History and Physical, chart, labs and discussed the procedure including the risks, benefits and alternatives for the proposed anesthesia with the patient or authorized representative who has indicated his/her understanding and acceptance.     Dental advisory given  Plan Discussed with: CRNA, Anesthesiologist and Surgeon  Anesthesia Plan Comments: (See PAT note 12/22/2022)        Anesthesia Quick Evaluation

## 2022-12-28 ENCOUNTER — Other Ambulatory Visit: Payer: Self-pay

## 2022-12-28 ENCOUNTER — Encounter (HOSPITAL_COMMUNITY)
Admission: RE | Disposition: A | Discharge status: Home / Self Care | Payer: Self-pay | Source: Home / Self Care | Attending: Urology

## 2022-12-28 ENCOUNTER — Ambulatory Visit (HOSPITAL_BASED_OUTPATIENT_CLINIC_OR_DEPARTMENT_OTHER): Payer: PPO | Admitting: Anesthesiology

## 2022-12-28 ENCOUNTER — Ambulatory Visit (HOSPITAL_COMMUNITY): Payer: PPO | Admitting: Physician Assistant

## 2022-12-28 ENCOUNTER — Encounter (HOSPITAL_COMMUNITY): Payer: Self-pay | Admitting: Urology

## 2022-12-28 ENCOUNTER — Ambulatory Visit (HOSPITAL_COMMUNITY)
Admission: RE | Admit: 2022-12-28 | Discharge: 2022-12-28 | Disposition: A | Discharge status: Home / Self Care | Payer: PPO | Attending: Urology | Admitting: Urology

## 2022-12-28 ENCOUNTER — Ambulatory Visit (HOSPITAL_COMMUNITY): Payer: PPO

## 2022-12-28 DIAGNOSIS — Z6841 Body Mass Index (BMI) 40.0 and over, adult: Secondary | ICD-10-CM | POA: Insufficient documentation

## 2022-12-28 DIAGNOSIS — E669 Obesity, unspecified: Secondary | ICD-10-CM | POA: Insufficient documentation

## 2022-12-28 DIAGNOSIS — I2510 Atherosclerotic heart disease of native coronary artery without angina pectoris: Secondary | ICD-10-CM | POA: Diagnosis not present

## 2022-12-28 DIAGNOSIS — I252 Old myocardial infarction: Secondary | ICD-10-CM | POA: Diagnosis not present

## 2022-12-28 DIAGNOSIS — I5032 Chronic diastolic (congestive) heart failure: Secondary | ICD-10-CM | POA: Diagnosis not present

## 2022-12-28 DIAGNOSIS — C671 Malignant neoplasm of dome of bladder: Secondary | ICD-10-CM | POA: Insufficient documentation

## 2022-12-28 DIAGNOSIS — I480 Paroxysmal atrial fibrillation: Secondary | ICD-10-CM | POA: Diagnosis not present

## 2022-12-28 DIAGNOSIS — G4730 Sleep apnea, unspecified: Secondary | ICD-10-CM | POA: Insufficient documentation

## 2022-12-28 DIAGNOSIS — G4733 Obstructive sleep apnea (adult) (pediatric): Secondary | ICD-10-CM

## 2022-12-28 DIAGNOSIS — C679 Malignant neoplasm of bladder, unspecified: Secondary | ICD-10-CM | POA: Diagnosis not present

## 2022-12-28 DIAGNOSIS — Z952 Presence of prosthetic heart valve: Secondary | ICD-10-CM | POA: Diagnosis not present

## 2022-12-28 DIAGNOSIS — C678 Malignant neoplasm of overlapping sites of bladder: Secondary | ICD-10-CM | POA: Diagnosis not present

## 2022-12-28 DIAGNOSIS — I110 Hypertensive heart disease with heart failure: Secondary | ICD-10-CM | POA: Diagnosis not present

## 2022-12-28 DIAGNOSIS — Z955 Presence of coronary angioplasty implant and graft: Secondary | ICD-10-CM | POA: Diagnosis not present

## 2022-12-28 DIAGNOSIS — Z87891 Personal history of nicotine dependence: Secondary | ICD-10-CM | POA: Diagnosis not present

## 2022-12-28 HISTORY — PX: CYSTOSCOPY W/ RETROGRADES: SHX1426

## 2022-12-28 SURGERY — CYSTOSCOPY, WITH RETROGRADE PYELOGRAM
Anesthesia: General | Laterality: Bilateral

## 2022-12-28 MED ORDER — CHLORHEXIDINE GLUCONATE 0.12 % MT SOLN
15.0000 mL | Freq: Once | OROMUCOSAL | Status: AC
  Administered 2022-12-28: 15 mL via OROMUCOSAL

## 2022-12-28 MED ORDER — LIDOCAINE 2% (20 MG/ML) 5 ML SYRINGE
INTRAMUSCULAR | Status: DC | PRN
Start: 2022-12-28 — End: 2022-12-28
  Administered 2022-12-28: 100 mg via INTRAVENOUS

## 2022-12-28 MED ORDER — PROPOFOL 10 MG/ML IV BOLUS
INTRAVENOUS | Status: DC | PRN
Start: 2022-12-28 — End: 2022-12-28
  Administered 2022-12-28: 200 mg via INTRAVENOUS

## 2022-12-28 MED ORDER — OXYCODONE HCL 5 MG/5ML PO SOLN: 5.0000 mg | Freq: Once | ORAL | Status: DC | PRN

## 2022-12-28 MED ORDER — HYDROMORPHONE HCL 1 MG/ML IJ SOLN: 0.2500 mg | INTRAMUSCULAR | Status: DC | PRN

## 2022-12-28 MED ORDER — FENTANYL CITRATE (PF) 100 MCG/2ML IJ SOLN
INTRAMUSCULAR | Status: AC
  Filled 2022-12-28: qty 2

## 2022-12-28 MED ORDER — OXYCODONE HCL 5 MG PO TABS: 5.0000 mg | ORAL_TABLET | Freq: Once | ORAL | Status: DC | PRN

## 2022-12-28 MED ORDER — ACETAMINOPHEN 500 MG PO TABS: 1000.0000 mg | ORAL_TABLET | Freq: Once | ORAL | Status: DC

## 2022-12-28 MED ORDER — FENTANYL CITRATE (PF) 100 MCG/2ML IJ SOLN
INTRAMUSCULAR | Status: DC | PRN
Start: 2022-12-28 — End: 2022-12-28
  Administered 2022-12-28: 100 ug via INTRAVENOUS

## 2022-12-28 MED ORDER — DEXAMETHASONE SODIUM PHOSPHATE 10 MG/ML IJ SOLN
INTRAMUSCULAR | Status: AC
  Filled 2022-12-28: qty 1

## 2022-12-28 MED ORDER — 0.9 % SODIUM CHLORIDE (POUR BTL) OPTIME
TOPICAL | Status: DC | PRN
Start: 2022-12-28 — End: 2022-12-28
  Administered 2022-12-28: 1000 mL

## 2022-12-28 MED ORDER — GENTAMICIN SULFATE 40 MG/ML IJ SOLN
5.0000 mg/kg | INTRAVENOUS | Status: AC
  Administered 2022-12-28: 446.4 mg via INTRAVENOUS
  Filled 2022-12-28: qty 11.25

## 2022-12-28 MED ORDER — ONDANSETRON HCL 4 MG/2ML IJ SOLN
INTRAMUSCULAR | Status: DC | PRN
Start: 2022-12-28 — End: 2022-12-28
  Administered 2022-12-28: 4 mg via INTRAVENOUS

## 2022-12-28 MED ORDER — ORAL CARE MOUTH RINSE: 15.0000 mL | Freq: Once | OROMUCOSAL | Status: AC

## 2022-12-28 MED ORDER — DEXAMETHASONE SODIUM PHOSPHATE 10 MG/ML IJ SOLN
INTRAMUSCULAR | Status: DC | PRN
Start: 2022-12-28 — End: 2022-12-28
  Administered 2022-12-28: 10 mg via INTRAVENOUS

## 2022-12-28 MED ORDER — ROCURONIUM BROMIDE 10 MG/ML (PF) SYRINGE
PREFILLED_SYRINGE | INTRAVENOUS | Status: DC | PRN
Start: 2022-12-28 — End: 2022-12-28
  Administered 2022-12-28: 60 mg via INTRAVENOUS

## 2022-12-28 MED ORDER — STERILE WATER FOR IRRIGATION IR SOLN
Status: DC | PRN
Start: 2022-12-28 — End: 2022-12-28
  Administered 2022-12-28: 3000 mL via INTRAVESICAL

## 2022-12-28 MED ORDER — OXYCODONE-ACETAMINOPHEN 5-325 MG PO TABS
1.0000 | ORAL_TABLET | Freq: Four times a day (QID) | ORAL | 0 refills | Status: DC | PRN
Start: 2022-12-28 — End: 2023-03-01

## 2022-12-28 MED ORDER — SODIUM CHLORIDE 0.9 % IR SOLN
Status: DC | PRN
  Administered 2022-12-28: 6000 mL via INTRAVESICAL

## 2022-12-28 MED ORDER — SUGAMMADEX SODIUM 200 MG/2ML IV SOLN
INTRAVENOUS | Status: DC | PRN
Start: 2022-12-28 — End: 2022-12-28
  Administered 2022-12-28: 300 mg via INTRAVENOUS

## 2022-12-28 MED ORDER — IOHEXOL 300 MG/ML  SOLN
INTRAMUSCULAR | Status: DC | PRN
Start: 2022-12-28 — End: 2022-12-28
  Administered 2022-12-28: 30 mL

## 2022-12-28 MED ORDER — ONDANSETRON HCL 4 MG/2ML IJ SOLN
INTRAMUSCULAR | Status: AC
  Filled 2022-12-28: qty 2

## 2022-12-28 MED ORDER — SENNOSIDES-DOCUSATE SODIUM 8.6-50 MG PO TABS: 1.0000 | ORAL_TABLET | Freq: Two times a day (BID) | ORAL | 0 refills | Status: DC

## 2022-12-28 MED ORDER — ACETAMINOPHEN 10 MG/ML IV SOLN: 1000.0000 mg | Freq: Once | INTRAVENOUS | Status: DC | PRN

## 2022-12-28 MED ORDER — LACTATED RINGERS IV SOLN: INTRAVENOUS | Status: DC

## 2022-12-28 MED ORDER — ONDANSETRON HCL 4 MG/2ML IJ SOLN: 4.0000 mg | Freq: Once | INTRAMUSCULAR | Status: DC | PRN

## 2022-12-28 SURGICAL SUPPLY — 23 items
BAG COUNTER SPONGE SURGICOUNT (BAG) IMPLANT
BAG DRN RND TRDRP ANRFLXCHMBR (UROLOGICAL SUPPLIES)
BAG SPNG CNTER NS LX DISP (BAG)
BAG URINE DRAIN 2000ML AR STRL (UROLOGICAL SUPPLIES) IMPLANT
BAG URO CATCHER STRL LF (MISCELLANEOUS) ×2 IMPLANT
CATH URETL OPEN END 6FR 70 (CATHETERS) ×1 IMPLANT
CLOTH BEACON ORANGE TIMEOUT ST (SAFETY) ×2 IMPLANT
DRAPE FOOT SWITCH (DRAPES) ×2 IMPLANT
ELECT REM PT RETURN 15FT ADLT (MISCELLANEOUS) ×2 IMPLANT
EVACUATOR MICROVAS BLADDER (UROLOGICAL SUPPLIES) IMPLANT
GLOVE SURG LX STRL 7.5 STRW (GLOVE) ×2 IMPLANT
GOWN STRL REUS W/ TWL XL LVL3 (GOWN DISPOSABLE) ×2 IMPLANT
GOWN STRL REUS W/TWL XL LVL3 (GOWN DISPOSABLE) ×2
GUIDEWIRE STR DUAL SENSOR (WIRE) ×2 IMPLANT
KIT TURNOVER KIT A (KITS) IMPLANT
LOOP CUT BIPOLAR 24F LRG (ELECTROSURGICAL) ×1 IMPLANT
MANIFOLD NEPTUNE II (INSTRUMENTS) ×2 IMPLANT
NS IRRIG 1000ML POUR BTL (IV SOLUTION) IMPLANT
PACK CYSTO (CUSTOM PROCEDURE TRAY) ×2 IMPLANT
SYR TOOMEY IRRIG 70ML (MISCELLANEOUS) ×2
SYRINGE TOOMEY IRRIG 70ML (MISCELLANEOUS) IMPLANT
TUBING CONNECTING 10 (TUBING) ×2 IMPLANT
TUBING UROLOGY SET (TUBING) ×2 IMPLANT

## 2022-12-28 NOTE — Anesthesia Postprocedure Evaluation (Signed)
Anesthesia Post Note  Patient: DVONTA CALTRIDER  Procedure(s) Performed: CYSTOSCOPY WITH RETROGRADE PYELOGRAM, FULGARATION OF BLEEDERS (Bilateral)     Patient location during evaluation: PACU Anesthesia Type: General Level of consciousness: awake and alert Pain management: pain level controlled Vital Signs Assessment: post-procedure vital signs reviewed and stable Respiratory status: spontaneous breathing, nonlabored ventilation, respiratory function stable and patient connected to nasal cannula oxygen Cardiovascular status: blood pressure returned to baseline and stable Postop Assessment: no apparent nausea or vomiting Anesthetic complications: no   No notable events documented.  Last Vitals:  Vitals:   12/28/22 1345 12/28/22 1358  BP: (!) 146/69 (!) 165/83  Pulse: (!) 59 60  Resp: 16   Temp:    SpO2: 94% 96%    Last Pain:  Vitals:   12/28/22 1358  TempSrc:   PainSc: 0-No pain                 Trevor Iha

## 2022-12-28 NOTE — Brief Op Note (Signed)
12/28/2022  12:41 PM  PATIENT:  Luis [name]  67 y.o. male  PRE-OPERATIVE DIAGNOSIS:  RECURRENT BLADDER CANCER  POST-OPERATIVE DIAGNOSIS:  RECURRENT BLADDER CANCER  PROCEDURE:  Procedure(s): CYSTOSCOPY WITH RETROGRADE PYELOGRAM, FULGARATION OF BLEEDERS (Bilateral)  SURGEON:  Surgeons and Role:    * Anais Denslow, Delbert Phenix., MD - Primary  PHYSICIAN ASSISTANT:   ASSISTANTS: none   ANESTHESIA:   general  EBL:  minimal   BLOOD ADMINISTERED:none  DRAINS: none   LOCAL MEDICATIONS USED:  NONE  SPECIMEN:  No Specimen  DISPOSITION OF SPECIMEN:  N/A  COUNTS:  YES  TOURNIQUET:  * No tourniquets in log *  DICTATION: .Other Dictation: Dictation Number [id number]  PLAN OF CARE: Discharge to home after PACU  PATIENT DISPOSITION:  PACU - hemodynamically stable.   Delay start of Pharmacological VTE agent (>24hrs) due to surgical blood loss or risk of bleeding: yes

## 2022-12-28 NOTE — H&P (Signed)
Luis [name] is an 67 y.o. male.    Chief Complaint: Pre-Op Transurethral Resection of Bladder Tumor  HPI:   1 - High Grade Bladder Cancer - T1G3 by TURBT + gemcitabine 09/2019 by Vernie Ammons for small left wall tumor, no muscle in specimen on eval gross hematuria. CT w/o upper tract lesions. Restaging 11/2019 with CIS only, no gross disease. Induction BCG x 6 ending 01/2020. ??Recent Course:  ?06/2020 cysto - no recurrence;  09/2020 cysto - left bladder neck 1.5cm recurrence==> TURBT T1G3;  11/2020 restaging TURBT CIS only including prostatic urethra ? 12/2020 - re-induction BCG x6 (not good cystectomy candidate) ? 04/2022 - cysto stable chronic left dome erythema and NEW left base lateral papillary tumor (abour 3cm) ==> 05/2022 T1G3 prostatic uretrha + multifocal bladder (dome not reachable) ==> Begin Q3 week pembolizumab under care Dr. [name]. ? 08/2022 - cysto - 3cm nodular tumor at dome (not accessable with resectoscope);  11/2022 cysto - progression of dome tumor, some likely bladder neck recurrence as well.   ??2 - Urinary Retention - on/off uirnary retention 2021. Prostate vol 67mL (fiarly normal for age) by CT ellipsoid calculation 2021. Placed on tamsulosin and passed trial of void 10/2019. FU PVR 11/2019 "0ml" ??   ??PMH sig for obesity (most truncal), CAD/Stents/CHF/AVR/Lasix (follows Dr. Sherlie Ban, ASA only now which he has held prior), lap chole, neck surgery, elbow surgery. He is retired Neurosurgeon. His PCP is Novella Olive MD.   ??Today " [name]" is seen to proceed with TURBT for recurrent / progressive bladder cancer. Interval CT with non-specific borderline adenopathy, no overt metastatic disease. Cr 0.8, Hgb 14, Most recent UA without infectious parameters.     Past Medical History:  Diagnosis Date   Anxiety    takes Valium as needed   Aortic stenosis, moderate    Arthritis    Ascending aortic aneurysm (HCC)    CAD (coronary artery disease)    a. s/p PCI of the RCA  8/12 with DES by Dr Excell Seltzer, preserved EF. b. LHC/RHC (2/16) with mean RA 12, PA 32/15, mean PCWP 18, CI 3.47; patent mid and distal RCA stents, 50-60% proximal stenosis small PDA.      Cancer [facility])    bladder   Carotid stenosis    a. Carotid US (05/2013):  Bilateral 1-39% ICA; L thyroid nodule (prior hx of aspiration).   Chronic diastolic CHF (congestive heart failure) (HCC)    Complication of anesthesia    difficulty waking up after gallbladder surgery   Depression    Dyspnea    Essential hypertension    GERD (gastroesophageal reflux disease)    if needed will take OTC meds    Heart murmur    History of colonic polyps    hyperplastic   Hyperlipidemia    Joint pain    Lesion of bladder    Myocardial infarction (HCC) 2012   Obesity (BMI 30-39.9) 02/29/2016   Pre-diabetes    Restless leg    Sleep apnea    uses cpap   Tubular adenoma of colon    Vertigo    takes Meclizine as needed    Past Surgical History:  Procedure Laterality Date   AORTIC VALVE REPLACEMENT N/A 09/16/2021   Procedure: AORTIC VALVE REPLACEMENT (AVR);  Surgeon: Alleen Borne, MD;  Location: [facility] OR;  Service: Open Heart Surgery;  Laterality: N/A;   CATARACT EXTRACTION   4 YRS AGO   BOTH EYES   CHOLECYSTECTOMY  07/21/2011   Procedure: LAPAROSCOPIC  CHOLECYSTECTOMY WITH INTRAOPERATIVE CHOLANGIOGRAM;  Surgeon: Kandis Cocking, MD;  Location: [facility] ORS;  Service: General;  Laterality: N/A;   CORONARY ANGIOPLASTY  2012   2 stents   coronary stenting     s/p PCI of the RCA by Dr Excell Seltzer 8/12 with 2 promus stents   CYSTOSCOPY W/ RETROGRADES Bilateral 12/13/2019   Procedure: CYSTOSCOPY WITH RETROGRADE PYELOGRAM;  Surgeon: Sebastian Ache, MD;  Location: [facility];  Service: Urology;  Laterality: Bilateral;   CYSTOSCOPY W/ RETROGRADES Bilateral 10/14/2020   Procedure: CYSTOSCOPY WITH RETROGRADE PYELOGRAM;  Surgeon: Sebastian Ache, MD;  Location: [facility];  Service: Urology;  Laterality:  Bilateral;   CYSTOSCOPY W/ RETROGRADES Bilateral 12/16/2020   Procedure: CYSTOSCOPY WITH RETROGRADE PYELOGRAM;  Surgeon: Sebastian Ache, MD;  Location: [facility];  Service: Urology;  Laterality: Bilateral;   CYSTOSCOPY W/ RETROGRADES Bilateral 06/03/2022   Procedure: CYSTOSCOPY WITH RETROGRADE PYELOGRAM;  Surgeon: Sebastian Ache, MD;  Location: [facility]S;  Service: Urology;  Laterality: Bilateral;   LEFT AND RIGHT HEART CATHETERIZATION WITH CORONARY ANGIOGRAM N/A 06/23/2014   Procedure: LEFT AND RIGHT HEART CATHETERIZATION WITH CORONARY ANGIOGRAM;  Surgeon: Laurey Morale, MD;  Location: [facility] CATH LAB;  Service: Cardiovascular;  Laterality: N/A;   NECK SURGERY  03/23/09   per Dr. Ophelia Charter, cervical fusion    PERICARDIOCENTESIS N/A 09/28/2021   Procedure: PERICARDIOCENTESIS;  Surgeon: Corky Crafts, MD;  Location: [facility] INVASIVE CV LAB;  Service: Cardiovascular;  Laterality: N/A;   REPLACEMENT ASCENDING AORTA N/A 09/16/2021   Procedure: REPLACEMENT ASCENDING AORTA WITH 30 X HEMASHIELD PLATINUM WOVEN DOUBLE VELOUR VASCULAR GRAFT;  Surgeon: Alleen Borne, MD;  Location: [facility] OR;  Service: Open Heart Surgery;  Laterality: N/A;  CIRC ARREST   right elbow surgery     RIGHT HEART CATH AND CORONARY ANGIOGRAPHY N/A 07/08/2021   Procedure: RIGHT HEART CATH AND CORONARY ANGIOGRAPHY;  Surgeon: Laurey Morale, MD;  Location: [facility] INVASIVE CV LAB;  Service: Cardiovascular;  Laterality: N/A;   solonscopy  05/23/08   per Dr. Celene Kras hemorrhoids only, repeat in 5 years   SUBXYPHOID PERICARDIAL WINDOW N/A 09/28/2021   Procedure: SUBXYPHOID PERICARDIAL WINDOW;  Surgeon: Alleen Borne, MD;  Location: [facility] OR;  Service: Thoracic;  Laterality: N/A;   TEE WITHOUT CARDIOVERSION N/A 06/23/2014   Procedure: TRANSESOPHAGEAL ECHOCARDIOGRAM (TEE);  Surgeon: Laurey Morale, MD;  Location: [facility] ENDOSCOPY;  Service: Cardiovascular;  Laterality: N/A;   TEE WITHOUT CARDIOVERSION N/A 01/21/2016   Procedure:  TRANSESOPHAGEAL ECHOCARDIOGRAM (TEE);  Surgeon: Laurey Morale, MD;  Location: [facility] ENDOSCOPY;  Service: Cardiovascular;  Laterality: N/A;   TEE WITHOUT CARDIOVERSION N/A 09/16/2021   Procedure: TRANSESOPHAGEAL ECHOCARDIOGRAM (TEE);  Surgeon: Alleen Borne, MD;  Location: [facility] OR;  Service: Open Heart Surgery;  Laterality: N/A;   TEE WITHOUT CARDIOVERSION N/A 09/28/2021   Procedure: TRANSESOPHAGEAL ECHOCARDIOGRAM (TEE);  Surgeon: Alleen Borne, MD;  Location: [facility] OR;  Service: Thoracic;  Laterality: N/A;   TONSILLECTOMY     TRANSURETHRAL RESECTION OF BLADDER TUMOR N/A 10/21/2019   Procedure: TRANSURETHRAL RESECTION OF BLADDER TUMOR (TURBT);  Surgeon: Ihor Gully, MD;  Location: [facility];  Service: Urology;  Laterality: N/A;   TRANSURETHRAL RESECTION OF BLADDER TUMOR N/A 12/13/2019   Procedure: TRANSURETHRAL RESECTION OF BLADDER TUMOR (TURBT);  Surgeon: Sebastian Ache, MD;  Location: [facility];  Service: Urology;  Laterality: N/A;  1 HR   TRANSURETHRAL RESECTION OF BLADDER TUMOR N/A 10/14/2020   Procedure: TRANSURETHRAL RESECTION  OF BLADDER TUMOR (TURBT);  Surgeon: Sebastian Ache, MD;  Location: [facility];  Service: Urology;  Laterality: N/A;   TRANSURETHRAL RESECTION OF BLADDER TUMOR N/A 12/16/2020   Procedure: RESTAGING TRANSURETHRAL RESECTION OF BLADDER TUMOR (TURBT);  Surgeon: Sebastian Ache, MD;  Location: [facility];  Service: Urology;  Laterality: N/A;   TRANSURETHRAL RESECTION OF BLADDER TUMOR N/A 06/03/2022   Procedure: TRANSURETHRAL RESECTION OF BLADDER TUMOR (TURBT);  Surgeon: Sebastian Ache, MD;  Location: [facility];  Service: Urology;  Laterality: N/A;    Family History  Problem Relation Age of Onset   Lung cancer Mother        lung   Esophageal cancer Cousin    Colon cancer Neg Hx    Rectal cancer Neg Hx    Stomach cancer Neg Hx    Social History:  reports that he quit smoking about 12 years ago. His smoking use  included cigarettes. He started smoking about 52 years ago. He has a 80 pack-year smoking history. He has never used smokeless tobacco. He reports that he does not drink alcohol and does not use drugs.  Allergies: No Known Allergies  No medications prior to admission.    No results found for this or any previous visit (from the past 48 hour(s)). No results found.  Review of Systems  Constitutional:  Negative for chills and fever.  All other systems reviewed and are negative.   There were no vitals taken for this visit. Physical Exam Vitals reviewed.  Constitutional:      Comments: Pleasant, at baseline.   HENT:     Head: Normocephalic.     Nose: Nose normal.  Eyes:     Pupils: Pupils are equal, round, and reactive to light.  Cardiovascular:     Rate and Rhythm: Normal rate.  Pulmonary:     Effort: Pulmonary effort is normal.  Abdominal:     Comments: Stable large truncal obesity  Genitourinary:    Comments: No CVAT Musculoskeletal:        General: Normal range of motion.     Cervical back: Normal range of motion.  Skin:    General: Skin is warm.  Neurological:     General: No focal deficit present.     Mental Status: He is alert.  Psychiatric:        Mood and Affect: Mood normal.      Assessment/Plan  Proceed as planned with TURBT, retrogrades. Risks, benefits, alternatives, expected peri-op course discussed previously and reiterated today. We have requested extra long resectoscope to aide in reachign his dome tumor with his challenging body habius.   Loletta Parish., MD 12/28/2022, 5:32 AM

## 2022-12-28 NOTE — Transfer of Care (Signed)
Immediate Anesthesia Transfer of Care Note  Patient: Luis [name]  Procedure(s) Performed: CYSTOSCOPY WITH RETROGRADE PYELOGRAM, FULGARATION OF BLEEDERS (Bilateral)  Patient Location: PACU  Anesthesia Type:General  Level of Consciousness: awake, alert , and oriented  Airway & Oxygen Therapy: Patient Spontanous Breathing  Post-op Assessment: Report given to RN and Post -op Vital signs reviewed and stable  Post vital signs: Reviewed and stable  Last Vitals:  Vitals Value Taken Time  BP 150/89 12/28/22 1254  Temp 36.5 C 12/28/22 1254  Pulse 57 12/28/22 1259  Resp 16 12/28/22 1259  SpO2 100 % 12/28/22 1259  Vitals shown include unfiled device data.  Last Pain:  Vitals:   12/28/22 1002  TempSrc:   PainSc: 0-No pain         Complications: No notable events documented.

## 2022-12-28 NOTE — Op Note (Unsigned)
NAME: Luis [name], Luis [name] MEDICAL RECORD NO: [id number] ACCOUNT NO: [id number] DATE OF BIRTH: 26-Feb-1956 FACILITY: [facility] LOCATION: [facility] PHYSICIAN: Sebastian Ache, MD  Operative Report   DATE OF PROCEDURE: 12/28/2022  PREOPERATIVE DIAGNOSIS:  High-grade recurrent bladder cancer with aberrant lower tract anatomy.  POSTOPERATIVE DIAGNOSIS:  High-grade recurrent bladder cancer with aberrant lower tract anatomy.  PROCEDURE PERFORMED:   1.  Cystoscopy with bilateral retrograde pyelograms interpretation. 2.  Fulguration of bleeders.  ESTIMATED BLOOD LOSS:  20 mL.  COMPLICATIONS:  None.  SPECIMEN:  None.  FINDINGS:   1.  Persistent severe angulation, high riding bladder neck and inability to visualize and contact dome tumor with the standard resectoscope or extra long resectoscope mostly due to angulation not length. 2.  Unremarkable bilateral retrograde pyelograms. 3.  Bleeding near the area of bladder neck and prostate due to torque from the scope.  This was fulgurated with excellent hemostasis at the end of the case.  INDICATIONS:  The patient is a pleasant, but unfortunate 67 year old man with history of very large obesity, high-grade bladder cancer over a number of years.  He is status post BCG induction and has had several recurrences that have been symptomatic.   His most recent pertinent tumor at the bladder dome.  He has a severely high riding bladder neck with acute angulation, making endoscopic management nearly impossible.  He was found on workup of hematuria and staging cystoscopy to have significant some  recurrence of tumor.  Staging imaging was unremarkable for any overt distant disease.  He has been on PD-L1 inhibitors per medical oncology, but disease has continued to progress slowly.  Options were discussed including recommended path of reattempt  transurethral resection for maximal debulking and palliation with the use of the extra resectoscope, he wished to proceed, we  verified preoperatively this equipment was available in working order.  Informed consent was obtained and placed in medical  record.  PROCEDURE IN DETAIL:  The patient being identified and verified. Procedure being cystoscopy, bilateral retrograde, transurethral resection of bladder tumor was confirmed.  Procedure timeout was performed. Intravenous antibiotics administered.  General  endotracheal anesthesia was introduced.  He was intubated for maximal relaxation and endoscopic angles placed into a low lithotomy position.  Sterile field was created, prepped and draped the patient's penis, perineum, and proximal thighs using iodine.   Cystourethroscopy was performed using 21-French rigid cystoscope with offset lens per standard posterior urethra revealed severe high riding bladder neck with angulation with really only the trigone area below able to visualize a standard cystoscopy.  Attention directed to the retrogrades, the right ureteral orifice was cannulated with 6-French open-ended catheter, and right retrograde pyelogram was obtained.  Right retrograde pyelogram demonstrated single right ureter, single system right kidney.  No filling defects or narrowing noted.  Similarly, left retrograde pyelogram was obtained.  Left retrograde pyelogram demonstrated single left ureter, single system left kidney.  No filling defects or narrowing noted.  Cystoscope was exchanged to 26-French resectoscope sheath with visual obturator and has had multiple attempts and angulating to  visualize the tumor.  This was not able to be performed, it was felt to be essentially mostly due to very high riding bladder neck.  The extra long resectoscope set was also used to visualize the area of tumor and similarly this was not possible mostly  due to angulation, it was quite unfortunate.  Flexible cystoscopy was used with 16-French flexible cystoscope, which did corroborate approximately 4 cm bladder dome occurrence.  This was  visualized only with this technique.  The semirigid scope was then  once again used to infiltrate the area of the bladder neck and prostatic urethra, which showed excellent hemostasis as it was not definitely possible to perform any tumor resection, even with the extra long tools. Bladder was partially empty per  cystoscope.  Procedure was then terminated.  The patient tolerated the procedure well, no immediate periprocedural complications.  The patient taken to postanesthesia care in stable condition.  Plan for discharge home.  We were asked to consider alternative methods of management including palliative, comfort care options versus reconsideration cystectomy, understanding he is a risky surgical candidate we were attempting endoscopy via a perineal urethrostomy.  However, I  felt that the angulations from yesterday likely would not even allow that.   PUS D: 12/28/2022 12:48:07 pm T: 12/28/2022 1:16:00 pm  JOB: [id number]/ [id number]

## 2022-12-28 NOTE — Anesthesia Procedure Notes (Signed)
Procedure Name: Intubation Date/Time: 12/28/2022 11:55 AM  Performed by: [place], Clydene Pugh, CRNAPre-anesthesia Checklist: Patient identified, Emergency Drugs available, Suction available and Patient being monitored Patient Re-evaluated:Patient Re-evaluated prior to induction Oxygen Delivery Method: Circle system utilized Preoxygenation: Pre-oxygenation with 100% oxygen Induction Type: IV induction Ventilation: Mask ventilation without difficulty Laryngoscope Size: Mac and 4 Grade View: Grade I Tube type: Oral Tube size: 7.5 mm Number of attempts: 1 Airway Equipment and Method: Stylet and Oral airway Placement Confirmation: ETT inserted through vocal cords under direct vision, positive ETCO2 and breath sounds checked- equal and bilateral Secured at: 22 cm Tube secured with: Tape Dental Injury: Teeth and Oropharynx as per pre-operative assessment

## 2022-12-28 NOTE — Discharge Instructions (Signed)
1 - You may have urinary urgency (bladder spasms) and bloody urine on / off for up to 2 weeks.  This is normal.  2 - Call MD or go to ER for fever >102, severe pain / nausea / vomiting not relieved by medications, or acute change in medical status  

## 2022-12-29 ENCOUNTER — Encounter (HOSPITAL_COMMUNITY): Payer: Self-pay | Admitting: Urology

## 2023-01-01 ENCOUNTER — Emergency Department (HOSPITAL_COMMUNITY)
Admission: EM | Admit: 2023-01-01 | Discharge: 2023-01-01 | Disposition: A | Discharge status: Home / Self Care | Payer: PPO | Attending: Emergency Medicine | Admitting: Emergency Medicine

## 2023-01-01 ENCOUNTER — Encounter (HOSPITAL_COMMUNITY): Payer: Self-pay

## 2023-01-01 ENCOUNTER — Other Ambulatory Visit: Payer: Self-pay

## 2023-01-01 DIAGNOSIS — Z7982 Long term (current) use of aspirin: Secondary | ICD-10-CM | POA: Diagnosis not present

## 2023-01-01 DIAGNOSIS — Z8551 Personal history of malignant neoplasm of bladder: Secondary | ICD-10-CM | POA: Diagnosis not present

## 2023-01-01 DIAGNOSIS — R300 Dysuria: Secondary | ICD-10-CM

## 2023-01-01 DIAGNOSIS — N390 Urinary tract infection, site not specified: Secondary | ICD-10-CM | POA: Diagnosis not present

## 2023-01-01 DIAGNOSIS — Z79899 Other long term (current) drug therapy: Secondary | ICD-10-CM | POA: Diagnosis not present

## 2023-01-01 LAB — URINALYSIS, W/ REFLEX TO CULTURE (INFECTION SUSPECTED)
Bacteria, UA: NONE SEEN
Bilirubin Urine: NEGATIVE
Glucose, UA: >=500 mg/dL — AB
Ketones, ur: NEGATIVE mg/dL
Leukocytes,Ua: NEGATIVE
Nitrite: NEGATIVE
Protein, ur: 100 mg/dL — AB
RBC / HPF: >50 RBC/hpf (ref 0–5)
Specific Gravity, Urine: 1.021 (ref 1.005–1.030)
pH: 8 (ref 5.0–8.0)

## 2023-01-01 LAB — CBC WITH DIFFERENTIAL/PLATELET
Abs Immature Granulocytes: 0.02 10*3/uL (ref 0.00–0.07)
Basophils Absolute: 0 10*3/uL (ref 0.0–0.1)
Basophils Relative: 0 %
Eosinophils Absolute: 0.2 10*3/uL (ref 0.0–0.5)
Eosinophils Relative: 3 %
HCT: 45.9 % (ref 39.0–52.0)
Hemoglobin: 14.8 g/dL (ref 13.0–17.0)
Immature Granulocytes: 0 %
Lymphocytes Relative: 20 %
Lymphs Abs: 1.5 10*3/uL (ref 0.7–4.0)
MCH: 30.3 pg (ref 26.0–34.0)
MCHC: 32.2 g/dL (ref 30.0–36.0)
MCV: 93.9 fL (ref 80.0–100.0)
Monocytes Absolute: 0.6 10*3/uL (ref 0.1–1.0)
Monocytes Relative: 8 %
Neutro Abs: 5 10*3/uL (ref 1.7–7.7)
Neutrophils Relative %: 69 %
Platelets: 152 10*3/uL (ref 150–400)
RBC: 4.89 MIL/uL (ref 4.22–5.81)
RDW: 14.5 % (ref 11.5–15.5)
WBC: 7.3 10*3/uL (ref 4.0–10.5)
nRBC: 0 % (ref 0.0–0.2)

## 2023-01-01 LAB — BASIC METABOLIC PANEL
Anion gap: 10 (ref 5–15)
BUN: 25 mg/dL — ABNORMAL HIGH (ref 8–23)
CO2: 28 mmol/L (ref 22–32)
Calcium: 9.1 mg/dL (ref 8.9–10.3)
Chloride: 102 mmol/L (ref 98–111)
Creatinine, Ser: 0.96 mg/dL (ref 0.61–1.24)
GFR, Estimated: >60 mL/min (ref >60)
Glucose, Bld: 142 mg/dL — ABNORMAL HIGH (ref 70–99)
Potassium: 4.1 mmol/L (ref 3.5–5.1)
Sodium: 140 mmol/L (ref 135–145)

## 2023-01-01 MED ORDER — CEFDINIR 300 MG PO CAPS
300.0000 mg | ORAL_CAPSULE | Freq: Two times a day (BID) | ORAL | 0 refills | Status: DC
Start: 2023-01-01 — End: 2023-02-28

## 2023-01-01 NOTE — ED Triage Notes (Signed)
Pt arrived Pov reporting hx of bladder ca. Has  a procedure done Friday and since, has not been able to fully urinate. Denies any pain at this tme. No other symptoms reported

## 2023-01-01 NOTE — ED Provider Notes (Signed)
EMERGENCY DEPARTMENT AT [facility] Provider Note   CSN: [id number] Arrival date & time: 01/01/23  1055     History  Chief Complaint  Patient presents with   Urinary Retention    Luis [name] is a 67 y.o. male.  Patient complains of difficulty urinating.  Patient reports that he feels like his bladder is full.  Patient reports that he is dribbling continuously.  Patient reports earlier today that he passed a clot of blood.  He is concerned that he has a clot keeping him from urinating.  Patient had a procedure on 9/4 by Dr. [name].  Patient has a past medical history of bladder cancer.  Patient reports he has had multiple previous treatments.  Patient reports discomfort with attempting to urinate.  The history is provided by the patient.       Home Medications Prior to Admission medications   Medication Sig Start Date End Date Taking? Authorizing Provider  cefdinir (OMNICEF) 300 MG capsule Take 1 capsule (300 mg total) by mouth 2 (two) times daily. 01/01/23  Yes Elson Areas, PA-C  aspirin 81 MG tablet Take 81 mg by mouth every morning.    [provider]  carvedilol (COREG) 3.125 MG tablet TAKE 1 TABLET BY MOUTH TWICE A DAY WITH FOOD 12/06/22   Laurey Morale, MD  diazepam (VALIUM) 5 MG tablet Take 1 tablet (5 mg total) by mouth every 12 (twelve) hours as needed for anxiety. 05/23/22   Nelwyn Salisbury, MD  empagliflozin (JARDIANCE) 10 MG TABS tablet Take 1 tablet (10 mg total) by mouth daily before breakfast. 01/17/22   Laurey Morale, MD  Ferrous Sulfate (IRON PO) Take 1 tablet by mouth daily. 1 tab daily    [provider]  furosemide (LASIX) 20 MG tablet TAKE 60MG  IN THE MORNING AND 40MG  IN THE EVENING 11/14/22   Laurey Morale, MD  ibuprofen (ADVIL) 200 MG tablet Take 200-600 mg by mouth every 6 (six) hours as needed for moderate pain. 02/15/18   [provider]  ipratropium (ATROVENT) 0.03 % nasal spray Place 2 sprays into  both nostrils every 12 (twelve) hours as needed for rhinitis.    [provider]  KLOR-CON M20 20 MEQ tablet TAKE 2 TABLETS BY MOUTH EVERY DAY 08/17/22   Laurey Morale, MD  Magnesium 500 MG TABS Take 500 mg by mouth daily.    [provider]  meclizine (ANTIVERT) 25 MG tablet Take 1 tablet (25 mg total) by mouth 3 (three) times daily as needed for dizziness. 09/09/22   Nelwyn Salisbury, MD  multivitamin-iron-minerals-folic acid (CENTRUM) chewable tablet Chew 1 tablet by mouth daily. 09/23/21   Barrett, Erin R, PA-C  nitroGLYCERIN (NITROSTAT) 0.4 MG SL tablet Place 0.4 mg under the tongue every 5 (five) minutes as needed for chest pain. 11/03/17 12/05/25  [provider]  oxyCODONE-acetaminophen (PERCOCET) 5-325 MG tablet Take 1 tablet by mouth every 6 (six) hours as needed for severe pain or moderate pain (post-operatively). 12/28/22 12/28/23  Loletta Parish., MD  phenazopyridine (PYRIDIUM) 200 MG tablet Take 200 mg by mouth every 8 (eight) hours as needed. AZO 07/05/22   [provider]  rosuvastatin (CRESTOR) 20 MG tablet TAKE 1 TABLET BY MOUTH EVERYDAY AT BEDTIME 02/15/22   [place], Anderson Malta, FNP  senna-docusate (SENOKOT-S) 8.6-50 MG tablet Take 1 tablet by mouth 2 (two) times daily. While taking strong pain meds to prevent constipation. 12/28/22   Manny,  Delbert Phenix., MD  tamsulosin (FLOMAX) 0.4 MG CAPS capsule Take 0.4 mg by mouth daily.    [provider]  venlafaxine XR (EFFEXOR-XR) 150 MG 24 hr capsule TAKE 1 CAPSULE BY MOUTH DAILY WITH BREAKFAST. 11/10/22   Panosh, Neta Mends, MD      Allergies    Patient has no known allergies.    Review of Systems   Review of Systems  All other systems reviewed and are negative.   Physical Exam Updated Vital Signs BP (!) 143/77 (BP Location: Right Arm)   Pulse (!) 55   Temp 98.2 F (36.8 C) (Oral)   Resp 18   Ht 5\' 6"  (1.676 m)   Wt 123 kg   SpO2 95%   BMI 43.77 kg/m  Physical Exam Vitals and nursing  note reviewed.  Constitutional:      Appearance: He is well-developed.  HENT:     Head: Normocephalic.     Nose: Nose normal.     Mouth/Throat:     Mouth: Mucous membranes are moist.  Eyes:     Pupils: Pupils are equal, round, and reactive to light.  Cardiovascular:     Rate and Rhythm: Normal rate.  Pulmonary:     Effort: Pulmonary effort is normal.  Abdominal:     General: Abdomen is flat. There is no distension.     Palpations: Abdomen is soft.     Tenderness: There is no abdominal tenderness.  Musculoskeletal:        General: Normal range of motion.     Cervical back: Normal range of motion.  Skin:    General: Skin is warm.  Neurological:     General: No focal deficit present.     Mental Status: He is alert and oriented to person, place, and time.  Psychiatric:        Mood and Affect: Mood normal.     ED Results / Procedures / Treatments   Labs (all labs ordered are listed, but only abnormal results are displayed) Labs Reviewed  BASIC METABOLIC PANEL - Abnormal; Notable for the following components:      Result Value   Glucose, Bld 142 (*)    BUN 25 (*)    All other components within normal limits  URINALYSIS, W/ REFLEX TO CULTURE (INFECTION SUSPECTED) - Abnormal; Notable for the following components:   APPearance HAZY (*)    Glucose, UA >=500 (*)    Hgb urine dipstick LARGE (*)    Protein, ur 100 (*)    All other components within normal limits  URINE CULTURE  CBC WITH DIFFERENTIAL/PLATELET    EKG None  Radiology No results found.  Procedures Procedures    Medications Ordered in ED Medications - No data to display  ED Course/ Medical Decision Making/ A&P                                 Medical Decision Making Patient complains of urinary discomfort dribbling and feeling like his bladder is full  Amount and/or Complexity of Data Reviewed Independent Historian: spouse    Details: Patient is here with a family member who is  supportive External Data Reviewed: notes.    Details: Urology  notes reviewed Labs: ordered.    Details: UA shows elevated white blood cell count and red blood cells. Discussion of management or test interpretation with external provider(s): Discussed the patient with Dr. Joyce Gross urology on-call.  She advised Omnicef for urinary tract infection.  She agrees with removing catheter.  Patient is a advised to follow-up in the office  Risk Prescription drug management. Risk Details: I placed a Foley catheter.  Patient reports relief from discomfort.  Patient did not have any clots no gross hematuria.  Patient had approximately 200 cc of output. I discussed the findings with patient.  Patient is advised we will treat him with an antibiotic Foley is removed because patient does not appear to have clots or obstructive urinary process at this time Urine culture is ordered.           Final Clinical Impression(s) / ED Diagnoses Final diagnoses:  Dysuria  Urinary tract infection with hematuria, site unspecified    Rx / DC Orders ED Discharge Orders          Ordered    cefdinir (OMNICEF) 300 MG capsule  2 times daily        01/01/23 1336           An After Visit Summary was printed and given to the patient.    Elson Areas, [place] 01/01/23 1444    Wynetta Fines, MD 01/01/23 [phone]

## 2023-01-01 NOTE — Discharge Instructions (Addendum)
Return if any problems.

## 2023-01-02 LAB — URINE CULTURE: Culture: NO GROWTH

## 2023-01-06 ENCOUNTER — Other Ambulatory Visit: Payer: PPO

## 2023-01-06 ENCOUNTER — Ambulatory Visit: Payer: PPO | Admitting: Hematology and Oncology

## 2023-01-06 ENCOUNTER — Ambulatory Visit: Payer: PPO

## 2023-01-09 DIAGNOSIS — C678 Malignant neoplasm of overlapping sites of bladder: Secondary | ICD-10-CM | POA: Diagnosis not present

## 2023-01-10 ENCOUNTER — Other Ambulatory Visit (HOSPITAL_COMMUNITY): Payer: Self-pay | Admitting: Cardiology

## 2023-01-10 ENCOUNTER — Encounter: Payer: Self-pay | Admitting: Urology

## 2023-01-12 ENCOUNTER — Telehealth (HOSPITAL_COMMUNITY): Payer: Self-pay | Admitting: Pharmacy Technician

## 2023-01-12 ENCOUNTER — Encounter (HOSPITAL_COMMUNITY): Payer: Self-pay | Admitting: Cardiology

## 2023-01-12 NOTE — Telephone Encounter (Signed)
Called and spoke with patient. Grant obtained.

## 2023-01-12 NOTE — Telephone Encounter (Signed)
Advanced Heart Failure Patient Advocate Encounter  The patient was approved for a [employer] grant that will help cover the cost of Jardiance and Carvedilol. Total amount awarded, $10,000. Eligibility, 12/13/22 - 12/12/23.  ID [id number]  BIN [id number]  PCN [id number]  Group [id number]  Emailed patient copy of information.  Archer Asa, CPhT

## 2023-01-13 ENCOUNTER — Inpatient Hospital Stay: Payer: PPO

## 2023-01-13 ENCOUNTER — Inpatient Hospital Stay (HOSPITAL_BASED_OUTPATIENT_CLINIC_OR_DEPARTMENT_OTHER): Payer: PPO | Admitting: Hematology and Oncology

## 2023-01-13 ENCOUNTER — Inpatient Hospital Stay: Payer: PPO | Attending: Hematology and Oncology

## 2023-01-13 DIAGNOSIS — C679 Malignant neoplasm of bladder, unspecified: Secondary | ICD-10-CM | POA: Insufficient documentation

## 2023-01-13 DIAGNOSIS — Z5112 Encounter for antineoplastic immunotherapy: Secondary | ICD-10-CM | POA: Insufficient documentation

## 2023-01-13 DIAGNOSIS — Z79899 Other long term (current) drug therapy: Secondary | ICD-10-CM | POA: Diagnosis not present

## 2023-01-13 LAB — CBC WITH DIFFERENTIAL (CANCER CENTER ONLY)
Basophils Absolute: 0 10*3/uL (ref 0.0–0.1)
Basophils Relative: 1 %
Eosinophils Absolute: 0.3 10*3/uL (ref 0.0–0.5)
Eosinophils Relative: 4 %
HCT: 43 % (ref 39.0–52.0)
Hemoglobin: 14.3 g/dL (ref 13.0–17.0)
Lymphocytes Relative: 21 %
Lymphs Abs: 1.3 10*3/uL (ref 0.7–4.0)
MCH: 30.1 pg (ref 26.0–34.0)
MCHC: 33.3 g/dL (ref 30.0–36.0)
MCV: 90.5 fL (ref 80.0–100.0)
Monocytes Absolute: 0.4 10*3/uL (ref 0.1–1.0)
Monocytes Relative: 6 %
Neutro Abs: 4.2 10*3/uL (ref 1.7–7.7)
Neutrophils Relative %: 68 %
Platelet Count: 135 10*3/uL — ABNORMAL LOW (ref 150–400)
RBC: 4.75 MIL/uL (ref 4.22–5.81)
RDW: 14.1 % (ref 11.5–15.5)
WBC Count: 6.2 10*3/uL (ref 4.0–10.5)
nRBC: 0 % (ref 0.0–0.2)
nRBC: 0 /100 WBC

## 2023-01-13 LAB — CMP (CANCER CENTER ONLY)
ALT: 28 U/L (ref 0–44)
AST: 24 U/L (ref 15–41)
Albumin: 4 g/dL (ref 3.5–5.0)
Alkaline Phosphatase: 84 U/L (ref 38–126)
Anion gap: 8 (ref 5–15)
BUN: 22 mg/dL (ref 8–23)
CO2: 26 mmol/L (ref 22–32)
Calcium: 8.9 mg/dL (ref 8.9–10.3)
Chloride: 104 mmol/L (ref 98–111)
Creatinine: 1.19 mg/dL (ref 0.61–1.24)
GFR, Estimated: >60 mL/min (ref >60)
Glucose, Bld: 207 mg/dL — ABNORMAL HIGH (ref 70–99)
Potassium: 3.7 mmol/L (ref 3.5–5.1)
Sodium: 138 mmol/L (ref 135–145)
Total Bilirubin: 0.9 mg/dL (ref 0.3–1.2)
Total Protein: 7.1 g/dL (ref 6.5–8.1)

## 2023-01-13 MED ORDER — SODIUM CHLORIDE 0.9 % IV SOLN: Freq: Once | INTRAVENOUS | Status: AC

## 2023-01-13 MED ORDER — SODIUM CHLORIDE 0.9 % IV SOLN
200.0000 mg | Freq: Once | INTRAVENOUS | Status: AC
  Administered 2023-01-13: 200 mg via INTRAVENOUS
  Filled 2023-01-13: qty 200

## 2023-01-13 NOTE — Patient Instructions (Signed)
[facility]  Discharge Instructions: Thank you for choosing [facility] to provide your oncology and hematology care.   If you have a lab appointment with the Cancer Center, please go directly to the Cancer Center and check in at the registration area.   Wear comfortable clothing and clothing appropriate for easy access to any Portacath or PICC line.   We strive to give you quality time with your provider. You may need to reschedule your appointment if you arrive late (15 or more minutes).  Arriving late affects you and other patients whose appointments are after yours.  Also, if you miss three or more appointments without notifying the office, you may be dismissed from the clinic at the provider's discretion.      For prescription refill requests, have your pharmacy contact our office and allow 72 hours for refills to be completed.    Today you received the following chemotherapy and/or immunotherapy agents: Keytruda      To help prevent nausea and vomiting after your treatment, we encourage you to take your nausea medication as directed.  BELOW ARE SYMPTOMS THAT SHOULD BE REPORTED IMMEDIATELY: *FEVER GREATER THAN 100.4 F (38 C) OR HIGHER *CHILLS OR SWEATING *NAUSEA AND VOMITING THAT IS NOT CONTROLLED WITH YOUR NAUSEA MEDICATION *UNUSUAL SHORTNESS OF BREATH *UNUSUAL BRUISING OR BLEEDING *URINARY PROBLEMS (pain or burning when urinating, or frequent urination) *BOWEL PROBLEMS (unusual diarrhea, constipation, pain near the anus) TENDERNESS IN MOUTH AND THROAT WITH OR WITHOUT PRESENCE OF ULCERS (sore throat, sores in mouth, or a toothache) UNUSUAL RASH, SWELLING OR PAIN  UNUSUAL VAGINAL DISCHARGE OR ITCHING   Items with * indicate a potential emergency and should be followed up as soon as possible or go to the Emergency Department if any problems should occur.  Please show the CHEMOTHERAPY ALERT CARD or IMMUNOTHERAPY ALERT CARD at  check-in to the Emergency Department and triage nurse.  Should you have questions after your visit or need to cancel or reschedule your appointment, please contact [facility]  Dept: [phone]  and follow the prompts.  Office hours are 8:00 a.m. to 4:30 p.m. Monday - Friday. Please note that voicemails left after 4:00 p.m. may not be returned until the following business day.  We are closed weekends and major holidays. You have access to a nurse at all times for urgent questions. Please call the main number to the clinic Dept: [phone] and follow the prompts.   For any non-urgent questions, you may also contact your provider using MyChart. We now offer e-Visits for anyone 74 and older to request care online for non-urgent symptoms. For details visit [email or web address].   Also download the MyChart app! Go to the app store, search "MyChart", open the app, select [place], and log in with your MyChart username and password.

## 2023-01-13 NOTE — Progress Notes (Signed)
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 [name]  08/12/2022: Cycle 1 of Pembrolizumab 09/02/2022: Cycle 2 of Pembrolizumab  09/23/2022: Cycle 3 of Pembrolizumab 10/14/2022: Cycle 4 of Pembrolizumab 11/04/2022: Cycle 5 of Pembrolizumab 11/25/2022: Cycle 6 of Pembrolizumab 12/20/2022: Cycle 7 of Pembrolizumab 01/13/2023: Cycle 8 of Pembrolizumab   CHIEF COMPLAINTS/PURPOSE OF CONSULTATION:  "High Risk Non-Muscle Invasive Bladder Cancer "  HISTORY OF PRESENTING ILLNESS:  Luis [name] 67 y.o. male with medical history significant for BCG unresponsive high risk nonmuscle invasive bladder cancer who presents for a follow up before starting Cycle 8 of Pembrolizumab therapy. He is accompanied by his wife for this visit.   On exam today Luis [name] reports he has been well overall and interim since her last visit.  He reports that he has been having some blood in his urine and some burning and pain with urination.  He has met with urology who unfortunately is found he is had progression of his bladder cancer.  Urology is now recommending cystectomy.  Patient  reports his appetite is good and his energy is strong.  He is tolerating immunotherapy well without any toxicity.  Overall he feels like he must proceed with the cystectomy as his best option moving forward.  We discussed the cystectomy and other options moving forward and he voices understanding.  He denies fevers, chills, sweats, shortness of breath, chest pain or cough. He has no other complaints. A full 10 point ROS is otherwise negative.  MEDICAL HISTORY:  Past Medical History:  Diagnosis Date   Anxiety    takes Valium as needed   Aortic stenosis, moderate    Arthritis    Ascending aortic aneurysm (HCC)    CAD (coronary artery disease)    a. s/p PCI of the RCA 8/12 with DES by Dr Excell Seltzer, preserved EF. b. LHC/RHC (2/16) with mean RA 12, PA 32/15, mean PCWP 18, CI 3.47; patent mid and distal RCA stents, 50-60% proximal stenosis small PDA.      Cancer [facility])    bladder   Carotid stenosis    a. Carotid US (05/2013):  Bilateral 1-39% ICA; L thyroid nodule (prior hx of aspiration).   Chronic diastolic CHF (congestive heart failure) (HCC)    Complication of anesthesia    difficulty waking up after gallbladder surgery   Depression    Dyspnea    Essential hypertension    GERD (gastroesophageal reflux disease)    if needed will take OTC meds    Heart murmur    History of colonic polyps    hyperplastic   Hyperlipidemia    Joint pain    Lesion of bladder    Myocardial infarction (HCC) 2012   Obesity (BMI 30-39.9) 02/29/2016   Pre-diabetes    Restless leg  Sleep apnea    uses cpap   Tubular adenoma of colon    Vertigo    takes Meclizine as needed    SURGICAL HISTORY: Past Surgical History:  Procedure Laterality Date   AORTIC VALVE REPLACEMENT N/A 09/16/2021   Procedure: AORTIC VALVE REPLACEMENT (AVR);  Surgeon: Alleen Borne, MD;  Location: [facility] OR;  Service: Open Heart Surgery;  Laterality: N/A;   CATARACT EXTRACTION   4 YRS AGO   BOTH EYES   CHOLECYSTECTOMY  07/21/2011    Procedure: LAPAROSCOPIC CHOLECYSTECTOMY WITH INTRAOPERATIVE CHOLANGIOGRAM;  Surgeon: Kandis Cocking, MD;  Location: [facility] ORS;  Service: General;  Laterality: N/A;   CORONARY ANGIOPLASTY  2012   2 stents   coronary stenting     s/p PCI of the RCA by Dr Excell Seltzer 8/12 with 2 promus stents   CYSTOSCOPY W/ RETROGRADES Bilateral 12/13/2019   Procedure: CYSTOSCOPY WITH RETROGRADE PYELOGRAM;  Surgeon: Sebastian Ache, MD;  Location: [facility];  Service: Urology;  Laterality: Bilateral;   CYSTOSCOPY W/ RETROGRADES Bilateral 10/14/2020   Procedure: CYSTOSCOPY WITH RETROGRADE PYELOGRAM;  Surgeon: Sebastian Ache, MD;  Location: [facility];  Service: Urology;  Laterality: Bilateral;   CYSTOSCOPY W/ RETROGRADES Bilateral 12/16/2020   Procedure: CYSTOSCOPY WITH RETROGRADE PYELOGRAM;  Surgeon: Sebastian Ache, MD;  Location: [facility];  Service: Urology;  Laterality: Bilateral;   CYSTOSCOPY W/ RETROGRADES Bilateral 06/03/2022   Procedure: CYSTOSCOPY WITH RETROGRADE PYELOGRAM;  Surgeon: Sebastian Ache, MD;  Location: [facility];  Service: Urology;  Laterality: Bilateral;   CYSTOSCOPY W/ RETROGRADES Bilateral 12/28/2022   Procedure: CYSTOSCOPY WITH RETROGRADE PYELOGRAM, FULGARATION OF BLEEDERS;  Surgeon: Loletta Parish., MD;  Location: [facility];  Service: Urology;  Laterality: Bilateral;   LEFT AND RIGHT HEART CATHETERIZATION WITH CORONARY ANGIOGRAM N/A 06/23/2014   Procedure: LEFT AND RIGHT HEART CATHETERIZATION WITH CORONARY ANGIOGRAM;  Surgeon: Laurey Morale, MD;  Location: [facility] CATH LAB;  Service: Cardiovascular;  Laterality: N/A;   NECK SURGERY  03/23/09   per Dr. Ophelia Charter, cervical fusion    PERICARDIOCENTESIS N/A 09/28/2021   Procedure: PERICARDIOCENTESIS;  Surgeon: Corky Crafts, MD;  Location: [facility] INVASIVE CV LAB;  Service: Cardiovascular;  Laterality: N/A;   REPLACEMENT ASCENDING AORTA N/A 09/16/2021   Procedure: REPLACEMENT ASCENDING AORTA WITH 30 X  HEMASHIELD PLATINUM WOVEN DOUBLE VELOUR VASCULAR GRAFT;  Surgeon: Alleen Borne, MD;  Location: [facility] OR;  Service: Open Heart Surgery;  Laterality: N/A;  CIRC ARREST   right elbow surgery     RIGHT HEART CATH AND CORONARY ANGIOGRAPHY N/A 07/08/2021   Procedure: RIGHT HEART CATH AND CORONARY ANGIOGRAPHY;  Surgeon: Laurey Morale, MD;  Location: [facility] INVASIVE CV LAB;  Service: Cardiovascular;  Laterality: N/A;   solonscopy  05/23/08   per Dr. Celene Kras hemorrhoids only, repeat in 5 years   SUBXYPHOID PERICARDIAL WINDOW N/A 09/28/2021   Procedure: SUBXYPHOID PERICARDIAL WINDOW;  Surgeon: Alleen Borne, MD;  Location: [facility] OR;  Service: Thoracic;  Laterality: N/A;   TEE WITHOUT CARDIOVERSION N/A 06/23/2014   Procedure: TRANSESOPHAGEAL ECHOCARDIOGRAM (TEE);  Surgeon: Laurey Morale, MD;  Location: [facility] ENDOSCOPY;  Service: Cardiovascular;  Laterality: N/A;   TEE WITHOUT CARDIOVERSION N/A 01/21/2016   Procedure: TRANSESOPHAGEAL ECHOCARDIOGRAM (TEE);  Surgeon: Laurey Morale, MD;  Location: [facility] ENDOSCOPY;  Service: Cardiovascular;  Laterality: N/A;   TEE WITHOUT CARDIOVERSION N/A 09/16/2021   Procedure: TRANSESOPHAGEAL ECHOCARDIOGRAM (TEE);  Surgeon: Alleen Borne, MD;  Location: [facility] OR;  Service: Open Heart Surgery;  Laterality: N/A;   TEE WITHOUT CARDIOVERSION N/A 09/28/2021   Procedure: TRANSESOPHAGEAL ECHOCARDIOGRAM (TEE);  Surgeon: Alleen Borne, MD;  Location: [facility] OR;  Service: Thoracic;  Laterality: N/A;   TONSILLECTOMY     TRANSURETHRAL RESECTION OF BLADDER TUMOR N/A 10/21/2019   Procedure: TRANSURETHRAL RESECTION OF BLADDER TUMOR (TURBT);  Surgeon: Ihor Gully, MD;  Location: [facility];  Service: Urology;  Laterality: N/A;   TRANSURETHRAL RESECTION OF BLADDER TUMOR N/A 12/13/2019   Procedure: TRANSURETHRAL RESECTION OF BLADDER TUMOR (TURBT);  Surgeon: Sebastian Ache, MD;  Location: [facility];  Service: Urology;  Laterality: N/A;  1 HR   TRANSURETHRAL  RESECTION OF BLADDER TUMOR N/A 10/14/2020   Procedure: TRANSURETHRAL RESECTION OF BLADDER TUMOR (TURBT);  Surgeon: Sebastian Ache, MD;  Location: [facility];  Service: Urology;  Laterality: N/A;   TRANSURETHRAL RESECTION OF BLADDER TUMOR N/A 12/16/2020   Procedure: RESTAGING TRANSURETHRAL RESECTION OF BLADDER TUMOR (TURBT);  Surgeon: Sebastian Ache, MD;  Location: [facility];  Service: Urology;  Laterality: N/A;   TRANSURETHRAL RESECTION OF BLADDER TUMOR N/A 06/03/2022   Procedure: TRANSURETHRAL RESECTION OF BLADDER TUMOR (TURBT);  Surgeon: Sebastian Ache, MD;  Location: [facility];  Service: Urology;  Laterality: N/A;    SOCIAL HISTORY: Social History   Socioeconomic History   Marital status: Married    Spouse name: Not on file   Number of children: 4   Years of education: Not on file   Highest education level: Not on file  Occupational History   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  Tobacco Use   Smoking status: Former    Current packs/day: 0.00    Average packs/day: 2.0 packs/day for 40.0 years (80.0 ttl pk-yrs)    Types: Cigarettes    Start date: 67    Quit date: 2012    Years since quitting: 12.7   Smokeless tobacco: Never  Vaping Use   Vaping status: Never Used  Substance and Sexual Activity   Alcohol use: No    Alcohol/week: 0.0 standard drinks of alcohol   Drug use: No   Sexual activity: Not on file  Other Topics Concern   Not on file  Social History Narrative   Not on file   Social Determinants of Health   Financial Resource Strain: Low Risk  (11/03/2022)   Overall Financial Resource Strain (CARDIA)    Difficulty of Paying Living Expenses: Not hard at all  Food Insecurity: No Food Insecurity (11/03/2022)   Hunger Vital Sign    Worried About Running Out of Food in the Last Year: Never true    Ran Out of Food in the Last Year: Never true  Transportation Needs: No Transportation Needs (11/03/2022)    PRAPARE - Administrator, Civil Service (Medical): No    Lack of Transportation (Non-Medical): No  Physical Activity: Unknown (11/03/2022)   Exercise Vital Sign    Days of Exercise per Week: Patient unable to answer    Minutes of Exercise per Session: 30 min  Stress: No Stress Concern Present (11/03/2022)   [employer] of Occupational Health - Occupational Stress Questionnaire    Feeling of Stress : Not at all  Social Connections: Moderately Isolated (11/03/2022)   Social Connection and Isolation Panel [NHANES]    Frequency of Communication with Friends and Family: More than three times a week    Frequency of Social Gatherings with Friends and Family: More than three times a week  Attends Religious Services: Never    Active Member of Clubs or Organizations: No    Attends Banker Meetings: Never    Marital Status: Married  Catering manager Violence: Not At Risk (11/03/2022)   Humiliation, Afraid, Rape, and Kick questionnaire    Fear of Current or Ex-Partner: No    Emotionally Abused: No    Physically Abused: No    Sexually Abused: No    FAMILY HISTORY: Family History  Problem Relation Age of Onset   Lung cancer Mother        lung   Esophageal cancer Cousin    Colon cancer Neg Hx    Rectal cancer Neg Hx    Stomach cancer Neg Hx     ALLERGIES:  has No Known Allergies.  MEDICATIONS:  Current Outpatient Medications  Medication Sig Dispense Refill   aspirin 81 MG tablet Take 81 mg by mouth every morning.     carvedilol (COREG) 3.125 MG tablet TAKE 1 TABLET BY MOUTH TWICE A DAY WITH FOOD 180 tablet 1   cefdinir (OMNICEF) 300 MG capsule Take 1 capsule (300 mg total) by mouth 2 (two) times daily. 20 capsule 0   diazepam (VALIUM) 5 MG tablet Take 1 tablet (5 mg total) by mouth every 12 (twelve) hours as needed for anxiety. 60 tablet 2   Ferrous Sulfate (IRON PO) Take 1 tablet by mouth daily. 1 tab daily     furosemide (LASIX) 20 MG tablet TAKE 60MG  IN  THE MORNING AND 40MG  IN THE EVENING 150 tablet 3   ibuprofen (ADVIL) 200 MG tablet Take 200-600 mg by mouth every 6 (six) hours as needed for moderate pain.     ipratropium (ATROVENT) 0.03 % nasal spray Place 2 sprays into both nostrils every 12 (twelve) hours as needed for rhinitis.     JARDIANCE 10 MG TABS tablet TAKE 1 TABLET BY MOUTH DAILY BEFORE BREAKFAST. 30 tablet 11   KLOR-CON M20 20 MEQ tablet TAKE 2 TABLETS BY MOUTH EVERY DAY 180 tablet 1   Magnesium 500 MG TABS Take 500 mg by mouth daily.     meclizine (ANTIVERT) 25 MG tablet Take 1 tablet (25 mg total) by mouth 3 (three) times daily as needed for dizziness. 30 tablet 0   multivitamin-iron-minerals-folic acid (CENTRUM) chewable tablet Chew 1 tablet by mouth daily.     nitroGLYCERIN (NITROSTAT) 0.4 MG SL tablet Place 0.4 mg under the tongue every 5 (five) minutes as needed for chest pain.     oxyCODONE-acetaminophen (PERCOCET) 5-325 MG tablet Take 1 tablet by mouth every 6 (six) hours as needed for severe pain or moderate pain (post-operatively). 15 tablet 0   phenazopyridine (PYRIDIUM) 200 MG tablet Take 200 mg by mouth every 8 (eight) hours as needed. AZO     rosuvastatin (CRESTOR) 20 MG tablet TAKE 1 TABLET BY MOUTH EVERYDAY AT BEDTIME 90 tablet 3   senna-docusate (SENOKOT-S) 8.6-50 MG tablet Take 1 tablet by mouth 2 (two) times daily. While taking strong pain meds to prevent constipation. 10 tablet 0   tamsulosin (FLOMAX) 0.4 MG CAPS capsule Take 0.4 mg by mouth daily.     venlafaxine XR (EFFEXOR-XR) 150 MG 24 hr capsule TAKE 1 CAPSULE BY MOUTH DAILY WITH BREAKFAST. 90 capsule 0   No current facility-administered medications for this visit.    REVIEW OF SYSTEMS:   Constitutional: ( - ) fevers, ( - )  chills , ( - ) night sweats Eyes: ( - ) blurriness of vision, ( - )  double vision, ( - ) watery eyes Ears, nose, mouth, throat, and face: ( - ) mucositis, ( - ) sore throat Respiratory: ( - ) cough, ( - ) dyspnea, ( - )  wheezes Cardiovascular: ( - ) palpitation, ( - ) chest discomfort, ( - ) lower extremity swelling Gastrointestinal:  ( - ) nausea, ( - ) heartburn, ( - ) change in bowel habits Skin: ( - ) abnormal skin rashes Lymphatics: ( - ) new lymphadenopathy, ( - ) easy bruising Neurological: ( - ) numbness, ( - ) tingling, ( - ) new weaknesses Behavioral/Psych: ( - ) mood change, ( - ) new changes  All other systems were reviewed with the patient and are negative.  PHYSICAL EXAMINATION: ECOG PERFORMANCE STATUS: 1 - Symptomatic but completely ambulatory  Vitals:   01/13/23 0818  BP: 136/74  Pulse: 62  Resp: 16  Temp: 98.3 F (36.8 C)  SpO2: 97%      Filed Weights   01/13/23 0818  Weight: 282 lb 9.6 oz (128.2 kg)    GENERAL: well appearing elderly Caucasian male in NAD  SKIN: skin color, texture, turgor are normal, no rashes or significant lesions EYES: conjunctiva are pink and non-injected, sclera clear LUNGS: clear to auscultation and percussion with normal breathing effort HEART: regular rate & rhythm and no murmurs and no lower extremity edema Musculoskeletal: no cyanosis of digits and no clubbing  PSYCH: alert & oriented x 3, fluent speech NEURO: no focal motor/sensory deficits  LABORATORY DATA:  I have reviewed the data as listed    Latest Ref Rng & Units 01/01/2023   11:29 AM 12/20/2022   12:59 PM 11/25/2022    9:11 AM  CBC  WBC 4.0 - 10.5 K/uL 7.3  6.8  7.7   Hemoglobin 13.0 - 17.0 g/dL 16.1  09.6  04.5   Hematocrit 39.0 - 52.0 % 45.9  43.7  42.9   Platelets 150 - 400 K/uL 152  148  151        Latest Ref Rng & Units 01/01/2023   11:29 AM 12/20/2022   12:59 PM 11/25/2022    9:11 AM  CMP  Glucose 70 - 99 mg/dL 409  811  914   BUN 8 - 23 mg/dL 25  23  21    Creatinine 0.61 - 1.24 mg/dL 7.82  9.56  2.13   Sodium 135 - 145 mmol/L 140  139  140   Potassium 3.5 - 5.1 mmol/L 4.1  4.0  3.8   Chloride 98 - 111 mmol/L 102  103  105   CO2 22 - 32 mmol/L 28  28  28    Calcium 8.9  - 10.3 mg/dL 9.1  8.9  8.7   Total Protein 6.5 - 8.1 g/dL  7.1  7.1   Total Bilirubin 0.3 - 1.2 mg/dL  0.8  0.8   Alkaline Phos 38 - 126 U/L  83  89   AST 15 - 41 U/L  24  24   ALT 0 - 44 U/L  25  28     RADIOGRAPHIC STUDIES: DG C-Arm 1-60 Min-No Report  Result Date: 12/28/2022 Fluoroscopy was utilized by the requesting physician.  No radiographic interpretation.   CT ANGIO CHEST AORTA W/CM & OR WO/CM  Result Date: 12/14/2022 CLINICAL DATA:  Follow-up ascending aortic aneurysm EXAM: CT ANGIOGRAPHY CHEST WITH CONTRAST TECHNIQUE: Multidetector CT imaging of the chest was performed using the standard protocol during bolus administration of intravenous contrast. Multiplanar CT image reconstructions  and MIPs were obtained to evaluate the vascular anatomy. RADIATION DOSE REDUCTION: This exam was performed according to the departmental dose-optimization program which includes automated exposure control, adjustment of the mA and/or kV according to patient size and/or use of iterative reconstruction technique. CONTRAST:  75mL ISOVUE-370 IOPAMIDOL (ISOVUE-370) INJECTION 76% COMPARISON:  CTA chest dated September 28, 2021 FINDINGS: Cardiovascular: Prior aortic valve replacement and graft repair of the ascending thoracic aorta. No evidence of pseudoaneurysm. Normal caliber aortic arch and descending thoracic aorta with mild atherosclerotic disease. Severe left main and three-vessel coronary artery calcifications. Normal heart size. No pericardial effusion. Mediastinum/Nodes: Esophagus and thyroid are unremarkable. No enlarged lymph nodes seen in the chest. Lungs/Pleura: Central airways are patent. No consolidation, pleural effusion or pneumothorax. Upper Abdomen: No acute abnormality. Musculoskeletal: Prior median sternotomy. No aggressive appearing osseous lesions. Review of the MIP images confirms the above findings. IMPRESSION: 1. Prior aortic valve replacement and graft repair of the ascending thoracic aorta. No  evidence of pseudoaneurysm. 2. Coronary artery calcifications and aortic Atherosclerosis (ICD10-I70.0). Electronically Signed   By: Allegra Lai M.D.   On: 12/14/2022 15:26    ASSESSMENT & PLAN Luis [name] 67 y.o. 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  After review of the labs, review of the records, and discussion with the patient the patients findings are most consistent with BCG unresponsive high risk non-muscle invasive bladder cancer, not a candidate for cystectomy.  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 21 days until progression or intolerance up to 24 months. --Started Cycle 1 of Pembrolizumab on 08/12/2022.  PLAN: --Due to start Cycle 8 of Pembrolizumab therapy today.  He is having progression though the immunotherapy may be slowing it down.  Would recommend 1 final dose of Keytruda to hold him over until his impending surgery. --Labs from today were reviewed and adequate for treatment. WBC 6.2, hemoglobin 14.3, MCV 90.5, and platelets of 135, LFTs normal. --Proceed with treatment today without any modifications --CT imaging and recent urology evaluation are consistent with progression of disease.  --Discussed treatment options moving forward including cystectomy, Nadofaragene (unsure of availability locally) or [place] +BCG (unsure of availability locally) or surveillance. Patient noted he would like to proceed with surgery as offered by urology. -- Unlikely patient will require adjuvant chemotherapy status post cystectomy, however will have patient back after scheduled surgery and pathology has been reviewed to assure adjuvant therapy is not required. -Return to clinic in 8 weeks time.  # Abdominal Rash--improved -- Small circular rash, approximately 3 inches  in diameter. -- Patient has access to hydrocortisone 5% at home, recommend he apply that to the lesion 1-2 times daily to see if there is improvement.  #Urinary frequency and hematuria: -- Defer to urology for further evaluation and management  #Supportive Care -- chemotherapy education to be scheduled  -- port placement not required  -- no pain medication required at this time.   No orders of the defined types were placed in this encounter.  All questions were answered. The patient knows to call the clinic with any problems, questions or concerns.  I have spent a total of 30 minutes minutes of face-to-face and non-face-to-face time, preparing to see the patient, performing a medically appropriate examination, counseling and educating the patient, documenting clinical information in the electronic health record,and care coordination.  Ulysees Barns, MD Department of Hematology/Oncology [facility] Phone: [phone] Pager: [phone] Email: [email or web address]    01/13/2023 8:21 AM  Literature Support:  Pembrolizumab monotherapy for the treatment of high-risk non-muscle-invasive bladder cancer unresponsive to BCG ([id number]): an open-label, single-arm, multicentre, phase 2 study,The Lancet Oncology,Volume 22, Issue 7,2021,Pages 919-930,ISSN 5284-1324  -- Pembrolizumab monotherapy was tolerable and showed promising antitumour activity in patients with BCG-unresponsive non-muscle-invasive bladder cancer who declined or were ineligible for radical cystectomy and should be considered a a clinically active non-surgical treatment option in this difficult-to-treat population.  --All enrolled patients were assigned to receive pembrolizumab 200 mg intravenously every 3 weeks for up to 24 months or until centrally confirmed disease persistence, recurrence, or progression; unacceptable toxic effects

## 2023-01-16 ENCOUNTER — Telehealth: Payer: Self-pay | Admitting: Hematology and Oncology

## 2023-01-19 ENCOUNTER — Encounter (HOSPITAL_COMMUNITY): Payer: Self-pay

## 2023-01-19 ENCOUNTER — Other Ambulatory Visit: Payer: Self-pay | Admitting: Urology

## 2023-01-20 ENCOUNTER — Telehealth (HOSPITAL_BASED_OUTPATIENT_CLINIC_OR_DEPARTMENT_OTHER): Payer: Self-pay | Admitting: *Deleted

## 2023-01-20 ENCOUNTER — Telehealth: Payer: Self-pay | Admitting: Internal Medicine

## 2023-01-20 NOTE — Telephone Encounter (Signed)
Patient is returning phone call.  °

## 2023-01-20 NOTE — Telephone Encounter (Signed)
1st attempt to reach pt regarding surgical clearance and the need for a tele visit.  Left a message for pt to call back and ask for the preop team. 

## 2023-01-20 NOTE — Telephone Encounter (Signed)
Pre-operative Risk Assessment    Patient Name: Luis [name]  DOB: 02-Feb-1956 MRN: [id number]      Request for Surgical Clearance    Procedure:   Cysto with ICG injection, Robotic prostatectomy, cystectomy, node dissection heal. Conduct diversion  Date of Surgery:  Clearance 03/03/23                                 Surgeon:  Dr. Lucile Shutters Group or Practice Name:  [facility] Phone number:  [phone] Fax number:  [phone]   Type of Clearance Requested:   - Medical  - Pharmacy:  Hold Aspirin Not indicated.   Type of Anesthesia:  Not Indicated   Additional requests/questions:    Signed, Emmit Pomfret   01/20/2023, 11:03 AM

## 2023-01-20 NOTE — Telephone Encounter (Signed)
Name: Luis [name]  DOB: [birth year]  MRN: [id number]  Primary Cardiologist: None   Preoperative team, please contact this patient and set up a phone call appointment for further preoperative risk assessment. Please obtain consent and complete medication review. Thank you for your help.  I confirm that guidance regarding antiplatelet and oral anticoagulation therapy has been completed and, if necessary, noted below.  I also confirmed the patient resides in the state of [place]. As per [facility] Medical Board telemedicine laws, the patient must reside in the state in which the provider is licensed.  Patient's aspirin is not prescribed by cardiology.  Recommendations for holding aspirin will need to come from prescribing provider.  [name], NP 01/20/2023, 11:16 AM [facility]

## 2023-01-23 ENCOUNTER — Encounter: Payer: Self-pay | Admitting: Hematology and Oncology

## 2023-01-25 ENCOUNTER — Telehealth: Payer: Self-pay

## 2023-01-25 NOTE — Telephone Encounter (Signed)
Pt is scheduled for tele 10/31 at 9:20am. Med rec and consent done

## 2023-01-25 NOTE — Telephone Encounter (Signed)
Pt is scheduled for tele 10/31 at 9:20am. Med rec and consent done    Patient Consent for Virtual Visit        Luis [name] has provided verbal consent on 01/25/2023 for a virtual visit (video or telephone).   CONSENT FOR VIRTUAL VISIT FOR:  Luis [name]  By participating in this virtual visit I agree to the following:  I hereby voluntarily request, consent and authorize [facility] and its employed or contracted physicians, physician assistants, nurse practitioners or other licensed health care professionals (the Practitioner), to provide me with telemedicine health care services (the "Services") as deemed necessary by the treating Practitioner. I acknowledge and consent to receive the Services by the Practitioner via telemedicine. I understand that the telemedicine visit will involve communicating with the Practitioner through live audiovisual communication technology and the disclosure of certain medical information by electronic transmission. I acknowledge that I have been given the opportunity to request an in-person assessment or other available alternative prior to the telemedicine visit and am voluntarily participating in the telemedicine visit.  I understand that I have the right to withhold or withdraw my consent to the use of telemedicine in the course of my care at any time, without affecting my right to future care or treatment, and that the Practitioner or I may terminate the telemedicine visit at any time. I understand that I have the right to inspect all information obtained and/or recorded in the course of the telemedicine visit and may receive copies of available information for a reasonable fee.  I understand that some of the potential risks of receiving the Services via telemedicine include:  Delay or interruption in medical evaluation due to technological equipment failure or disruption; Information transmitted may not be sufficient (e.g. poor resolution of  images) to allow for appropriate medical decision making by the Practitioner; and/or  In rare instances, security protocols could fail, causing a breach of personal health information.  Furthermore, I acknowledge that it is my responsibility to provide information about my medical history, conditions and care that is complete and accurate to the best of my ability. I acknowledge that Practitioner's advice, recommendations, and/or decision may be based on factors not within their control, such as incomplete or inaccurate data provided by me or distortions of diagnostic images or specimens that may result from electronic transmissions. I understand that the practice of medicine is not an exact science and that Practitioner makes no warranties or guarantees regarding treatment outcomes. I acknowledge that a copy of this consent can be made available to me via my patient portal [facility] MyChart), or I can request a printed copy by calling the office of [facility].    I understand that my insurance will be billed for this visit.   I have read or had this consent read to me. I understand the contents of this consent, which adequately explains the benefits and risks of the Services being provided via telemedicine.  I have been provided ample opportunity to ask questions regarding this consent and the Services and have had my questions answered to my satisfaction. I give my informed consent for the services to be provided through the use of telemedicine in my medical care

## 2023-02-06 ENCOUNTER — Other Ambulatory Visit: Payer: Self-pay | Admitting: Internal Medicine

## 2023-02-06 ENCOUNTER — Other Ambulatory Visit (HOSPITAL_COMMUNITY): Payer: Self-pay | Admitting: Family Medicine

## 2023-02-06 DIAGNOSIS — F411 Generalized anxiety disorder: Secondary | ICD-10-CM

## 2023-02-10 ENCOUNTER — Encounter: Payer: Self-pay | Admitting: Adult Health

## 2023-02-10 ENCOUNTER — Telehealth: Payer: Self-pay | Admitting: Family Medicine

## 2023-02-10 ENCOUNTER — Telehealth (INDEPENDENT_AMBULATORY_CARE_PROVIDER_SITE_OTHER): Payer: PPO | Admitting: Adult Health

## 2023-02-10 VITALS — Temp 100.5°F | Ht 66.0 in | Wt 278.0 lb

## 2023-02-10 DIAGNOSIS — U071 COVID-19: Secondary | ICD-10-CM

## 2023-02-10 MED ORDER — MOLNUPIRAVIR EUA 200MG CAPSULE
4.0000 | ORAL_CAPSULE | Freq: Two times a day (BID) | ORAL | 0 refills | Status: DC
Start: 2023-02-10 — End: 2023-02-10

## 2023-02-10 MED ORDER — NIRMATRELVIR/RITONAVIR (PAXLOVID)TABLET: 3.0000 | ORAL_TABLET | Freq: Two times a day (BID) | ORAL | 0 refills | Status: AC

## 2023-02-10 NOTE — Progress Notes (Signed)
Virtual Visit via Video Note  I connected with Luis [name] on 02/10/23 at 11:30 AM EDT by a video enabled telemedicine application and verified that I am speaking with the correct person using two identifiers.  Location patient: home Location provider:work or home office Persons participating in the virtual visit: patient, provider  I discussed the limitations of evaluation and management by telemedicine and the availability of in person appointments. The patient expressed understanding and agreed to proceed.   HPI: 67 year old male who is being evaluated today for an acute issue.  He reports that he tested positive for COVID-19 last night.  He he tested himself when his symptoms started.  Symptoms include sneezing, dry cough, body aches, low-grade fever up to 100 degrees and fatigue.  He has not had any shortness of breath, wheezing, nausea, vomiting, or diarrhea.   ROS: See pertinent positives and negatives per HPI.  Past Medical History:  Diagnosis Date   Anxiety    takes Valium as needed   Aortic stenosis, moderate    Arthritis    Ascending aortic aneurysm (HCC)    CAD (coronary artery disease)    a. s/p PCI of the RCA 8/12 with DES by Dr Excell Seltzer, preserved EF. b. LHC/RHC (2/16) with mean RA 12, PA 32/15, mean PCWP 18, CI 3.47; patent mid and distal RCA stents, 50-60% proximal stenosis small PDA.      Cancer [facility])    bladder   Carotid stenosis    a. Carotid US (05/2013):  Bilateral 1-39% ICA; L thyroid nodule (prior hx of aspiration).   Chronic diastolic CHF (congestive heart failure) (HCC)    Complication of anesthesia    difficulty waking up after gallbladder surgery   Depression    Dyspnea    Essential hypertension    GERD (gastroesophageal reflux disease)    if needed will take OTC meds    Heart murmur    History of colonic polyps    hyperplastic   Hyperlipidemia    Joint pain    Lesion of bladder    Myocardial infarction (HCC) 2012   Obesity (BMI 30-39.9)  02/29/2016   Pre-diabetes    Restless leg    Sleep apnea    uses cpap   Tubular adenoma of colon    Vertigo    takes Meclizine as needed    Past Surgical History:  Procedure Laterality Date   AORTIC VALVE REPLACEMENT N/A 09/16/2021   Procedure: AORTIC VALVE REPLACEMENT (AVR);  Surgeon: Alleen Borne, MD;  Location: [facility] OR;  Service: Open Heart Surgery;  Laterality: N/A;   CATARACT EXTRACTION   4 YRS AGO   BOTH EYES   CHOLECYSTECTOMY  07/21/2011   Procedure: LAPAROSCOPIC CHOLECYSTECTOMY WITH INTRAOPERATIVE CHOLANGIOGRAM;  Surgeon: Kandis Cocking, MD;  Location: [facility] ORS;  Service: General;  Laterality: N/A;   CORONARY ANGIOPLASTY  2012   2 stents   coronary stenting     s/p PCI of the RCA by Dr Excell Seltzer 8/12 with 2 promus stents   CYSTOSCOPY W/ RETROGRADES Bilateral 12/13/2019   Procedure: CYSTOSCOPY WITH RETROGRADE PYELOGRAM;  Surgeon: Sebastian Ache, MD;  Location: [facility];  Service: Urology;  Laterality: Bilateral;   CYSTOSCOPY W/ RETROGRADES Bilateral 10/14/2020   Procedure: CYSTOSCOPY WITH RETROGRADE PYELOGRAM;  Surgeon: Sebastian Ache, MD;  Location: [facility];  Service: Urology;  Laterality: Bilateral;   CYSTOSCOPY W/ RETROGRADES Bilateral 12/16/2020   Procedure: CYSTOSCOPY WITH RETROGRADE PYELOGRAM;  Surgeon: Sebastian Ache, MD;  Location: [facility];  Service: Urology;  Laterality: Bilateral;   CYSTOSCOPY W/ RETROGRADES Bilateral 06/03/2022   Procedure: CYSTOSCOPY WITH RETROGRADE PYELOGRAM;  Surgeon: Sebastian Ache, MD;  Location: [facility];  Service: Urology;  Laterality: Bilateral;   CYSTOSCOPY W/ RETROGRADES Bilateral 12/28/2022   Procedure: CYSTOSCOPY WITH RETROGRADE PYELOGRAM, FULGARATION OF BLEEDERS;  Surgeon: Loletta Parish., MD;  Location: [facility];  Service: Urology;  Laterality: Bilateral;   LEFT AND RIGHT HEART CATHETERIZATION WITH CORONARY ANGIOGRAM N/A 06/23/2014   Procedure: LEFT AND RIGHT HEART CATHETERIZATION WITH  CORONARY ANGIOGRAM;  Surgeon: Laurey Morale, MD;  Location: [facility] CATH LAB;  Service: Cardiovascular;  Laterality: N/A;   NECK SURGERY  03/23/09   per Dr. Ophelia Charter, cervical fusion    PERICARDIOCENTESIS N/A 09/28/2021   Procedure: PERICARDIOCENTESIS;  Surgeon: Corky Crafts, MD;  Location: [facility] INVASIVE CV LAB;  Service: Cardiovascular;  Laterality: N/A;   REPLACEMENT ASCENDING AORTA N/A 09/16/2021   Procedure: REPLACEMENT ASCENDING AORTA WITH 30 X HEMASHIELD PLATINUM WOVEN DOUBLE VELOUR VASCULAR GRAFT;  Surgeon: Alleen Borne, MD;  Location: [facility] OR;  Service: Open Heart Surgery;  Laterality: N/A;  CIRC ARREST   right elbow surgery     RIGHT HEART CATH AND CORONARY ANGIOGRAPHY N/A 07/08/2021   Procedure: RIGHT HEART CATH AND CORONARY ANGIOGRAPHY;  Surgeon: Laurey Morale, MD;  Location: [facility] INVASIVE CV LAB;  Service: Cardiovascular;  Laterality: N/A;   solonscopy  05/23/08   per Dr. Celene Kras hemorrhoids only, repeat in 5 years   SUBXYPHOID PERICARDIAL WINDOW N/A 09/28/2021   Procedure: SUBXYPHOID PERICARDIAL WINDOW;  Surgeon: Alleen Borne, MD;  Location: [facility] OR;  Service: Thoracic;  Laterality: N/A;   TEE WITHOUT CARDIOVERSION N/A 06/23/2014   Procedure: TRANSESOPHAGEAL ECHOCARDIOGRAM (TEE);  Surgeon: Laurey Morale, MD;  Location: [facility] ENDOSCOPY;  Service: Cardiovascular;  Laterality: N/A;   TEE WITHOUT CARDIOVERSION N/A 01/21/2016   Procedure: TRANSESOPHAGEAL ECHOCARDIOGRAM (TEE);  Surgeon: Laurey Morale, MD;  Location: [facility] ENDOSCOPY;  Service: Cardiovascular;  Laterality: N/A;   TEE WITHOUT CARDIOVERSION N/A 09/16/2021   Procedure: TRANSESOPHAGEAL ECHOCARDIOGRAM (TEE);  Surgeon: Alleen Borne, MD;  Location: [facility] OR;  Service: Open Heart Surgery;  Laterality: N/A;   TEE WITHOUT CARDIOVERSION N/A 09/28/2021   Procedure: TRANSESOPHAGEAL ECHOCARDIOGRAM (TEE);  Surgeon: Alleen Borne, MD;  Location: [facility] OR;  Service: Thoracic;  Laterality: N/A;   TONSILLECTOMY     TRANSURETHRAL  RESECTION OF BLADDER TUMOR N/A 10/21/2019   Procedure: TRANSURETHRAL RESECTION OF BLADDER TUMOR (TURBT);  Surgeon: Ihor Gully, MD;  Location: [facility];  Service: Urology;  Laterality: N/A;   TRANSURETHRAL RESECTION OF BLADDER TUMOR N/A 12/13/2019   Procedure: TRANSURETHRAL RESECTION OF BLADDER TUMOR (TURBT);  Surgeon: Sebastian Ache, MD;  Location: [facility];  Service: Urology;  Laterality: N/A;  1 HR   TRANSURETHRAL RESECTION OF BLADDER TUMOR N/A 10/14/2020   Procedure: TRANSURETHRAL RESECTION OF BLADDER TUMOR (TURBT);  Surgeon: Sebastian Ache, MD;  Location: [facility];  Service: Urology;  Laterality: N/A;   TRANSURETHRAL RESECTION OF BLADDER TUMOR N/A 12/16/2020   Procedure: RESTAGING TRANSURETHRAL RESECTION OF BLADDER TUMOR (TURBT);  Surgeon: Sebastian Ache, MD;  Location: [facility];  Service: Urology;  Laterality: N/A;   TRANSURETHRAL RESECTION OF BLADDER TUMOR N/A 06/03/2022   Procedure: TRANSURETHRAL RESECTION OF BLADDER TUMOR (TURBT);  Surgeon: Sebastian Ache, MD;  Location: [facility]S;  Service: Urology;  Laterality: N/A;    Family History  Problem Relation Age of Onset  Lung cancer Mother        lung   Esophageal cancer Cousin    Colon cancer Neg Hx    Rectal cancer Neg Hx    Stomach cancer Neg Hx        Current Outpatient Medications:    aspirin 81 MG tablet, Take 81 mg by mouth every morning., Disp: , Rfl:    carvedilol (COREG) 3.125 MG tablet, TAKE 1 TABLET BY MOUTH TWICE A DAY WITH FOOD, Disp: 180 tablet, Rfl: 1   cefdinir (OMNICEF) 300 MG capsule, Take 1 capsule (300 mg total) by mouth 2 (two) times daily., Disp: 20 capsule, Rfl: 0   diazepam (VALIUM) 5 MG tablet, Take 1 tablet (5 mg total) by mouth every 12 (twelve) hours as needed for anxiety., Disp: 60 tablet, Rfl: 2   Ferrous Sulfate (IRON PO), Take 1 tablet by mouth daily. 1 tab daily, Disp: , Rfl:    furosemide (LASIX) 20 MG tablet, TAKE 60MG  IN THE  MORNING AND 40MG  IN THE EVENING, Disp: 150 tablet, Rfl: 3   ibuprofen (ADVIL) 200 MG tablet, Take 200-600 mg by mouth every 6 (six) hours as needed for moderate pain., Disp: , Rfl:    ipratropium (ATROVENT) 0.03 % nasal spray, Place 2 sprays into both nostrils every 12 (twelve) hours as needed for rhinitis., Disp: , Rfl:    JARDIANCE 10 MG TABS tablet, TAKE 1 TABLET BY MOUTH DAILY BEFORE BREAKFAST., Disp: 30 tablet, Rfl: 11   KLOR-CON M20 20 MEQ tablet, TAKE 2 TABLETS BY MOUTH EVERY DAY, Disp: 180 tablet, Rfl: 1   Magnesium 500 MG TABS, Take 500 mg by mouth daily., Disp: , Rfl:    meclizine (ANTIVERT) 25 MG tablet, Take 1 tablet (25 mg total) by mouth 3 (three) times daily as needed for dizziness., Disp: 30 tablet, Rfl: 0   molnupiravir EUA (LAGEVRIO) 200 mg CAPS capsule, Take 4 capsules (800 mg total) by mouth 2 (two) times daily for 5 days., Disp: 40 capsule, Rfl: 0   multivitamin-iron-minerals-folic acid (CENTRUM) chewable tablet, Chew 1 tablet by mouth daily., Disp: , Rfl:    nitroGLYCERIN (NITROSTAT) 0.4 MG SL tablet, Place 0.4 mg under the tongue every 5 (five) minutes as needed for chest pain., Disp: , Rfl:    oxyCODONE-acetaminophen (PERCOCET) 5-325 MG tablet, Take 1 tablet by mouth every 6 (six) hours as needed for severe pain or moderate pain (post-operatively)., Disp: 15 tablet, Rfl: 0   phenazopyridine (PYRIDIUM) 200 MG tablet, Take 200 mg by mouth every 8 (eight) hours as needed. AZO, Disp: , Rfl:    rosuvastatin (CRESTOR) 20 MG tablet, TAKE 1 TABLET BY MOUTH EVERYDAY AT BEDTIME, Disp: 90 tablet, Rfl: 3   senna-docusate (SENOKOT-S) 8.6-50 MG tablet, Take 1 tablet by mouth 2 (two) times daily. While taking strong pain meds to prevent constipation., Disp: 10 tablet, Rfl: 0   tamsulosin (FLOMAX) 0.4 MG CAPS capsule, Take 0.4 mg by mouth daily., Disp: , Rfl:    venlafaxine XR (EFFEXOR-XR) 150 MG 24 hr capsule, TAKE 1 CAPSULE BY MOUTH DAILY WITH BREAKFAST., Disp: 90 capsule, Rfl:  0  EXAM:  VITALS per patient if applicable:  GENERAL: alert, oriented, appears well and in no acute distress  HEENT: atraumatic, conjunttiva clear, no obvious abnormalities on inspection of external nose and ears  NECK: normal movements of the head and neck  LUNGS: on inspection no signs of respiratory distress, breathing rate appears normal, no obvious gross SOB, gasping or wheezing  CV: no obvious cyanosis  MS: moves all visible extremities without noticeable abnormality  PSYCH/NEURO: pleasant and cooperative, no obvious depression or anxiety, speech and thought processing grossly intact  ASSESSMENT AND PLAN:  Discussed the following assessment and plan:  1. COVID-19 virus infection - Will treat due to chronic medication conditions and symptoms.  - Follow up if not improving in the next 3-4 days or sooner if symptoms worsen  - molnupiravir EUA (LAGEVRIO) 200 mg CAPS capsule; Take 4 capsules (800 mg total) by mouth 2 (two) times daily for 5 days.  Dispense: 40 capsule; Refill: 0      I discussed the assessment and treatment plan with the patient. The patient was provided an opportunity to ask questions and all were answered. The patient agreed with the plan and demonstrated an understanding of the instructions.   The patient was advised to call back or seek an in-person evaluation if the symptoms worsen or if the condition fails to improve as anticipated.   Shirline Frees, NP

## 2023-02-10 NOTE — Telephone Encounter (Signed)
Please advise 

## 2023-02-10 NOTE — Telephone Encounter (Signed)
I called in Paxlovid instead

## 2023-02-10 NOTE — Telephone Encounter (Signed)
Pt wife is calling and the insurance will not cover covid medmolnupiravir EUA (LAGEVRIO) 200 mg CAPS capsule  that cory sent to pharm the will cover the other medication cory mention per wife  [id number] [place], [place] - [address] Phone: [phone]  Fax: [phone]

## 2023-02-13 NOTE — Telephone Encounter (Signed)
Noted  

## 2023-02-13 NOTE — Telephone Encounter (Signed)
Spoke to pt spouse and she stated he was able to get the first medication. Pt spouse stated that pt is in the donut hole and both medications were expensive. She stated they just paid $200 out-of-pocket for the 1st medication bc the paxlovid was $300. Pt is feeling a lot better.

## 2023-02-13 NOTE — Telephone Encounter (Signed)
Patient spouse notified of update  and verbalized understanding. 

## 2023-02-23 ENCOUNTER — Ambulatory Visit: Payer: PPO | Attending: Cardiology | Admitting: Student

## 2023-02-23 DIAGNOSIS — Z01810 Encounter for preprocedural cardiovascular examination: Secondary | ICD-10-CM

## 2023-02-23 NOTE — Progress Notes (Signed)
Virtual Visit via Telephone Note   Because of Adison Larowe Sidman's co-morbid illnesses, he is at least at moderate risk for complications without adequate follow up.  This format is felt to be most appropriate for this patient at this time.  The patient did not have access to video technology/had technical difficulties with video requiring transitioning to audio format only (telephone).  All issues noted in this document were discussed and addressed.  No physical exam could be performed with this format.  Please refer to the patient's chart for his consent to telehealth for [facility].  Evaluation Performed:  Preoperative cardiovascular risk assessment _____________   Date:  02/23/2023   Patient ID:  Luis [name], DOB 09/02/1955, MRN [id number] Patient Location:  Home Provider location:   Office  Primary Care Provider:  Nelwyn Salisbury, MD Primary Cardiologist:  None  Chief Complaint / Patient Profile   67 y.o. y/o male with a h/o CAD s/p PCI with stent to RCA August 2012, chronic diastolic heart failure, PAF postop AVR not on anticoagulation, carotid artery stenosis, aortic stenosis s/p AVR May 2023, ascending aortic aneurysm s/p repair May 2023, pericardial effusion s/p pericardial window June 2023, hypertension, hyperlipidemia, OSA, bladder cancer, tobacco abuse who is pending cystoscopy with ICG injection, robotic prostatectomy, cystectomy, node dissection, heal. conduction diversion by Dr. [name] on 03/03/2023 and presents today for telephonic preoperative cardiovascular risk assessment.  History of Present Illness    Luis [name] is a 67 y.o. male who presents via audio/video conferencing for a telehealth visit today.  Pt was last seen in cardiology clinic on 11/16/2022 by Dr. Shirlee Latch.  At that time Luis [name] was stable from a cardiac standpoint.  The patient is now pending procedure as outlined above. Since his last visit, he is doing well. Patient denies  shortness of breath, dyspnea on exertion, orthopnea or PND. He will occasionally get lower extremity edema that is normal in the morning and progresses throughout the day. No chest pain, pressure, or tightness. No palpitations. He stays active performing light to moderate yard work.   Past Medical History    Past Medical History:  Diagnosis Date   Anxiety    takes Valium as needed   Aortic stenosis, moderate    Arthritis    Ascending aortic aneurysm (HCC)    CAD (coronary artery disease)    a. s/p PCI of the RCA 8/12 with DES by Dr Excell Seltzer, preserved EF. b. LHC/RHC (2/16) with mean RA 12, PA 32/15, mean PCWP 18, CI 3.47; patent mid and distal RCA stents, 50-60% proximal stenosis small PDA.      Cancer [facility])    bladder   Carotid stenosis    a. Carotid US (05/2013):  Bilateral 1-39% ICA; L thyroid nodule (prior hx of aspiration).   Chronic diastolic CHF (congestive heart failure) (HCC)    Complication of anesthesia    difficulty waking up after gallbladder surgery   Depression    Dyspnea    Essential hypertension    GERD (gastroesophageal reflux disease)    if needed will take OTC meds    Heart murmur    History of colonic polyps    hyperplastic   Hyperlipidemia    Joint pain    Lesion of bladder    Myocardial infarction (HCC) 2012   Obesity (BMI 30-39.9) 02/29/2016   Pre-diabetes    Restless leg    Sleep apnea    uses cpap   Tubular adenoma of colon  Vertigo    takes Meclizine as needed   Past Surgical History:  Procedure Laterality Date   AORTIC VALVE REPLACEMENT N/A 09/16/2021   Procedure: AORTIC VALVE REPLACEMENT (AVR);  Surgeon: Alleen Borne, MD;  Location: [facility] OR;  Service: Open Heart Surgery;  Laterality: N/A;   CATARACT EXTRACTION   4 YRS AGO   BOTH EYES   CHOLECYSTECTOMY  07/21/2011   Procedure: LAPAROSCOPIC CHOLECYSTECTOMY WITH INTRAOPERATIVE CHOLANGIOGRAM;  Surgeon: Kandis Cocking, MD;  Location: [facility] ORS;  Service: General;  Laterality: N/A;   CORONARY  ANGIOPLASTY  2012   2 stents   coronary stenting     s/p PCI of the RCA by Dr Excell Seltzer 8/12 with 2 promus stents   CYSTOSCOPY W/ RETROGRADES Bilateral 12/13/2019   Procedure: CYSTOSCOPY WITH RETROGRADE PYELOGRAM;  Surgeon: Sebastian Ache, MD;  [facility];  Service: Urology;  Laterality: Bilateral;   CYSTOSCOPY W/ RETROGRADES Bilateral 10/14/2020   Procedure: CYSTOSCOPY WITH RETROGRADE PYELOGRAM;  Surgeon: Sebastian Ache, MD;  Location[facility];  Service: Urology;  Laterality: Bilateral;   CYSTOSCOPY W/ RETROGRADES Bilateral 12/16/2020   Procedure: CYSTOSCOPY WITH RETROGRADE PYELOGRAM;  Surgeon: Sebastian Ache, MD;  Location: [facility];  Service: Urology;  Laterality: Bilateral;   CYSTOSCOPY W/ RETROGRADES Bilateral 06/03/2022   Procedure: CYSTOSCOPY WITH RETROGRADE PYELOGRAM;  Surgeon: Sebastian Ache, MD;  Location: [facility];  Service: Urology;  Laterality: Bilateral;   CYSTOSCOPY W/ RETROGRADES Bilateral 12/28/2022   Procedure: CYSTOSCOPY WITH RETROGRADE PYELOGRAM, FULGARATION OF BLEEDERS;  Surgeon: Loletta Parish., MD;  Location: [facility];  Service: Urology;  Laterality: Bilateral;   LEFT AND RIGHT HEART CATHETERIZATION WITH CORONARY ANGIOGRAM N/A 06/23/2014   Procedure: LEFT AND RIGHT HEART CATHETERIZATION WITH CORONARY ANGIOGRAM;  Surgeon: Laurey Morale, MD;  Location: [facility] CATH LAB;  Service: Cardiovascular;  Laterality: N/A;   NECK SURGERY  03/23/09   per Dr. Ophelia Charter, cervical fusion    PERICARDIOCENTESIS N/A 09/28/2021   Procedure: PERICARDIOCENTESIS;  Surgeon: Corky Crafts, MD;  Location: [facility] INVASIVE CV LAB;  Service: Cardiovascular;  Laterality: N/A;   REPLACEMENT ASCENDING AORTA N/A 09/16/2021   Procedure: REPLACEMENT ASCENDING AORTA WITH 30 X HEMASHIELD PLATINUM WOVEN DOUBLE VELOUR VASCULAR GRAFT;  Surgeon: Alleen Borne, MD;  Location: [facility] OR;  Service: Open Heart Surgery;  Laterality: N/A;  CIRC ARREST   right elbow  surgery     RIGHT HEART CATH AND CORONARY ANGIOGRAPHY N/A 07/08/2021   Procedure: RIGHT HEART CATH AND CORONARY ANGIOGRAPHY;  Surgeon: Laurey Morale, MD;  Location: [facility] INVASIVE CV LAB;  Service: Cardiovascular;  Laterality: N/A;   solonscopy  05/23/08   per Dr. Celene Kras hemorrhoids only, repeat in 5 years   SUBXYPHOID PERICARDIAL WINDOW N/A 09/28/2021   Procedure: SUBXYPHOID PERICARDIAL WINDOW;  Surgeon: Alleen Borne, MD;  Location: [facility] OR;  Service: Thoracic;  Laterality: N/A;   TEE WITHOUT CARDIOVERSION N/A 06/23/2014   Procedure: TRANSESOPHAGEAL ECHOCARDIOGRAM (TEE);  Surgeon: Laurey Morale, MD;  Location: [facility] ENDOSCOPY;  Service: Cardiovascular;  Laterality: N/A;   TEE WITHOUT CARDIOVERSION N/A 01/21/2016   Procedure: TRANSESOPHAGEAL ECHOCARDIOGRAM (TEE);  Surgeon: Laurey Morale, MD;  Location: [facility] ENDOSCOPY;  Service: Cardiovascular;  Laterality: N/A;   TEE WITHOUT CARDIOVERSION N/A 09/16/2021   Procedure: TRANSESOPHAGEAL ECHOCARDIOGRAM (TEE);  Surgeon: Alleen Borne, MD;  Location: [facility] OR;  Service: Open Heart Surgery;  Laterality: N/A;   TEE WITHOUT CARDIOVERSION N/A 09/28/2021   Procedure: TRANSESOPHAGEAL ECHOCARDIOGRAM (TEE);  Surgeon: Laneta Simmers,  Payton Doughty, MD;  Location: [facility] OR;  Service: Thoracic;  Laterality: N/A;   TONSILLECTOMY     TRANSURETHRAL RESECTION OF BLADDER TUMOR N/A 10/21/2019   Procedure: TRANSURETHRAL RESECTION OF BLADDER TUMOR (TURBT);  Surgeon: Ihor Gully, MD;  Location: [facility];  Service: Urology;  Laterality: N/A;   TRANSURETHRAL RESECTION OF BLADDER TUMOR N/A 12/13/2019   Procedure: TRANSURETHRAL RESECTION OF BLADDER TUMOR (TURBT);  Surgeon: Sebastian Ache, MD;  Location: [facility];  Service: Urology;  Laterality: N/A;  1 HR   TRANSURETHRAL RESECTION OF BLADDER TUMOR N/A 10/14/2020   Procedure: TRANSURETHRAL RESECTION OF BLADDER TUMOR (TURBT);  Surgeon: Sebastian Ache, MD;  Location: [facility];  Service:  Urology;  Laterality: N/A;   TRANSURETHRAL RESECTION OF BLADDER TUMOR N/A 12/16/2020   Procedure: RESTAGING TRANSURETHRAL RESECTION OF BLADDER TUMOR (TURBT);  Surgeon: Sebastian Ache, MD;  Location: [facility];  Service: Urology;  Laterality: N/A;   TRANSURETHRAL RESECTION OF BLADDER TUMOR N/A 06/03/2022   Procedure: TRANSURETHRAL RESECTION OF BLADDER TUMOR (TURBT);  Surgeon: Sebastian Ache, MD;  Location: [facility];  Service: Urology;  Laterality: N/A;    Allergies  No Known Allergies  Home Medications    Prior to Admission medications   Medication Sig Start Date End Date Taking? Authorizing Provider  aspirin 81 MG tablet Take 81 mg by mouth every morning.    [provider]  carvedilol (COREG) 3.125 MG tablet TAKE 1 TABLET BY MOUTH TWICE A DAY WITH FOOD 12/06/22   Laurey Morale, MD  cefdinir (OMNICEF) 300 MG capsule Take 1 capsule (300 mg total) by mouth 2 (two) times daily. 01/01/23   Elson Areas, PA-C  diazepam (VALIUM) 5 MG tablet Take 1 tablet (5 mg total) by mouth every 12 (twelve) hours as needed for anxiety. 05/23/22   Nelwyn Salisbury, MD  Ferrous Sulfate (IRON PO) Take 1 tablet by mouth daily. 1 tab daily    [provider]  furosemide (LASIX) 20 MG tablet TAKE 60MG  IN THE MORNING AND 40MG  IN THE EVENING 11/14/22   Laurey Morale, MD  ibuprofen (ADVIL) 200 MG tablet Take 200-600 mg by mouth every 6 (six) hours as needed for moderate pain. 02/15/18   [provider]  ipratropium (ATROVENT) 0.03 % nasal spray Place 2 sprays into both nostrils every 12 (twelve) hours as needed for rhinitis.    [provider]  JARDIANCE 10 MG TABS tablet TAKE 1 TABLET BY MOUTH DAILY BEFORE BREAKFAST. 01/10/23   Laurey Morale, MD  KLOR-CON M20 20 MEQ tablet TAKE 2 TABLETS BY MOUTH EVERY DAY 08/17/22   Laurey Morale, MD  Magnesium 500 MG TABS Take 500 mg by mouth daily.    [provider]  meclizine (ANTIVERT) 25 MG tablet Take 1 tablet  (25 mg total) by mouth 3 (three) times daily as needed for dizziness. 09/09/22   Nelwyn Salisbury, MD  multivitamin-iron-minerals-folic acid (CENTRUM) chewable tablet Chew 1 tablet by mouth daily. 09/23/21   Barrett, Erin R, PA-C  nitroGLYCERIN (NITROSTAT) 0.4 MG SL tablet Place 0.4 mg under the tongue every 5 (five) minutes as needed for chest pain. 11/03/17 12/05/25  [provider]  oxyCODONE-acetaminophen (PERCOCET) 5-325 MG tablet Take 1 tablet by mouth every 6 (six) hours as needed for severe pain or moderate pain (post-operatively). 12/28/22 12/28/23  Loletta Parish., MD  phenazopyridine (PYRIDIUM) 200 MG tablet Take 200 mg by mouth every 8 (eight) hours as needed.  AZO 07/05/22   [provider]  rosuvastatin (CRESTOR) 20 MG tablet TAKE 1 TABLET BY MOUTH EVERYDAY AT BEDTIME 02/06/23   Milford, Anderson Malta, FNP  senna-docusate (SENOKOT-S) 8.6-50 MG tablet Take 1 tablet by mouth 2 (two) times daily. While taking strong pain meds to prevent constipation. 12/28/22   Loletta Parish., MD  tamsulosin (FLOMAX) 0.4 MG CAPS capsule Take 0.4 mg by mouth daily.    [provider]  venlafaxine XR (EFFEXOR-XR) 150 MG 24 hr capsule TAKE 1 CAPSULE BY MOUTH DAILY WITH BREAKFAST. 02/07/23   Nelwyn Salisbury, MD    Physical Exam    Vital Signs:  Luis [name] does not have vital signs available for review today.  Given telephonic nature of communication, physical exam is limited. AAOx3. NAD. Normal affect.  Speech and respirations are unlabored.  Accessory Clinical Findings    None  Assessment & Plan    Primary Cardiologist: None  Preoperative cardiovascular risk assessment. Cystoscopy with ICG injection, robotic prostatectomy, cystectomy, node dissection, heal. conduction diversion by Dr. [name] on 03/03/2023.  Chart reviewed as part of pre-operative protocol coverage. According to the RCRI, patient has a 6.6% risk of MACE. Patient reports activity equivalent to >4.0 METS  (performing light to moderate yard work).   Given past medical history and time since last visit, based on ACC/AHA guidelines, Luis [name] would be at acceptable risk for the planned procedure without further cardiovascular testing.   Patient was advised that if he develops new symptoms prior to surgery to contact our office to arrange a follow-up appointment.  he verbalized understanding.  Ideally aspirin should be continued without interruption, however if the bleeding risk is too great, aspirin may be held for 5-7 days prior to surgery. Please resume aspirin post operatively when it is felt to be safe from a bleeding standpoint.    I will route this recommendation to the requesting party via Epic fax function.  Please call with questions.  Time:   Today, I have spent 5 minutes with the patient with telehealth technology discussing medical history, symptoms, and management plan.     Carlos Levering, NP  02/23/2023, 9:26 AM

## 2023-02-27 DIAGNOSIS — G4733 Obstructive sleep apnea (adult) (pediatric): Secondary | ICD-10-CM | POA: Diagnosis not present

## 2023-02-28 ENCOUNTER — Encounter (HOSPITAL_COMMUNITY): Payer: Self-pay

## 2023-02-28 ENCOUNTER — Ambulatory Visit (HOSPITAL_COMMUNITY)
Admission: RE | Admit: 2023-02-28 | Discharge: 2023-02-28 | Disposition: A | Discharge status: Home / Self Care | Payer: PPO | Source: Ambulatory Visit | Attending: Family Medicine

## 2023-02-28 VITALS — BP 118/64 | HR 64 | Wt 284.8 lb

## 2023-02-28 DIAGNOSIS — I3139 Other pericardial effusion (noninflammatory): Secondary | ICD-10-CM

## 2023-02-28 DIAGNOSIS — I110 Hypertensive heart disease with heart failure: Secondary | ICD-10-CM | POA: Insufficient documentation

## 2023-02-28 DIAGNOSIS — Z95820 Peripheral vascular angioplasty status with implants and grafts: Secondary | ICD-10-CM | POA: Diagnosis not present

## 2023-02-28 DIAGNOSIS — I10 Essential (primary) hypertension: Secondary | ICD-10-CM | POA: Diagnosis not present

## 2023-02-28 DIAGNOSIS — I2510 Atherosclerotic heart disease of native coronary artery without angina pectoris: Secondary | ICD-10-CM | POA: Insufficient documentation

## 2023-02-28 DIAGNOSIS — Z955 Presence of coronary angioplasty implant and graft: Secondary | ICD-10-CM | POA: Insufficient documentation

## 2023-02-28 DIAGNOSIS — G4733 Obstructive sleep apnea (adult) (pediatric): Secondary | ICD-10-CM | POA: Insufficient documentation

## 2023-02-28 DIAGNOSIS — G2581 Restless legs syndrome: Secondary | ICD-10-CM | POA: Diagnosis not present

## 2023-02-28 DIAGNOSIS — C678 Malignant neoplasm of overlapping sites of bladder: Secondary | ICD-10-CM | POA: Diagnosis not present

## 2023-02-28 DIAGNOSIS — Z952 Presence of prosthetic heart valve: Secondary | ICD-10-CM

## 2023-02-28 DIAGNOSIS — I7121 Aneurysm of the ascending aorta, without rupture: Secondary | ICD-10-CM | POA: Insufficient documentation

## 2023-02-28 DIAGNOSIS — C679 Malignant neoplasm of bladder, unspecified: Secondary | ICD-10-CM | POA: Insufficient documentation

## 2023-02-28 DIAGNOSIS — I480 Paroxysmal atrial fibrillation: Secondary | ICD-10-CM | POA: Insufficient documentation

## 2023-02-28 DIAGNOSIS — E782 Mixed hyperlipidemia: Secondary | ICD-10-CM

## 2023-02-28 DIAGNOSIS — I5032 Chronic diastolic (congestive) heart failure: Secondary | ICD-10-CM | POA: Insufficient documentation

## 2023-02-28 DIAGNOSIS — E669 Obesity, unspecified: Secondary | ICD-10-CM | POA: Diagnosis not present

## 2023-02-28 DIAGNOSIS — E785 Hyperlipidemia, unspecified: Secondary | ICD-10-CM | POA: Insufficient documentation

## 2023-02-28 DIAGNOSIS — I350 Nonrheumatic aortic (valve) stenosis: Secondary | ICD-10-CM | POA: Insufficient documentation

## 2023-02-28 DIAGNOSIS — K567 Ileus, unspecified: Secondary | ICD-10-CM | POA: Diagnosis not present

## 2023-02-28 DIAGNOSIS — C775 Secondary and unspecified malignant neoplasm of intrapelvic lymph nodes: Secondary | ICD-10-CM | POA: Diagnosis not present

## 2023-02-28 DIAGNOSIS — F32A Depression, unspecified: Secondary | ICD-10-CM | POA: Diagnosis not present

## 2023-02-28 DIAGNOSIS — C7982 Secondary malignant neoplasm of genital organs: Secondary | ICD-10-CM | POA: Diagnosis not present

## 2023-02-28 DIAGNOSIS — Z79899 Other long term (current) drug therapy: Secondary | ICD-10-CM | POA: Insufficient documentation

## 2023-02-28 DIAGNOSIS — D0919 Carcinoma in situ of other urinary organs: Secondary | ICD-10-CM | POA: Diagnosis not present

## 2023-02-28 LAB — BASIC METABOLIC PANEL
Anion gap: 13 (ref 5–15)
BUN: 19 mg/dL (ref 8–23)
CO2: 25 mmol/L (ref 22–32)
Calcium: 8.9 mg/dL (ref 8.9–10.3)
Chloride: 102 mmol/L (ref 98–111)
Creatinine, Ser: 1.19 mg/dL (ref 0.61–1.24)
GFR, Estimated: >60 mL/min (ref >60)
Glucose, Bld: 135 mg/dL — ABNORMAL HIGH (ref 70–99)
Potassium: 3.8 mmol/L (ref 3.5–5.1)
Sodium: 140 mmol/L (ref 135–145)

## 2023-02-28 LAB — BRAIN NATRIURETIC PEPTIDE: B Natriuretic Peptide: 38.4 pg/mL (ref 0.0–100.0)

## 2023-02-28 NOTE — Progress Notes (Signed)
Patient ID: Luis [name], male   DOB: 1955-12-26, 67 y.o.   MRN: [id number] PCP: Dr. Clent Ridges Cardiology: Dr. Shirlee Latch  67 y.o. with history of CAD and moderate aortic stenosis presents for follow of AS and CAD. He had RCA PCI in 8/12.     When I saw him in early 2016, he reported significant exertional dyspnea.  I had him do a Lexiscan Cardiolite in 2/16.  This showed no ischemia or infarction.  I also had him get an echo that showed ?bicuspid aortic valve and probably moderate aortic stenosis.  Given ongoing symptoms, I had him do a RHC/LHC and TEE in 2/16.  The RHC showed mildly elevated left and right heart filling pressures and the LHC showed nonobstructive CAD.  TEE confirmed bicuspid aortic valve with moderate AS.  He had PFTs in 4/16 that were within normal limits.  I started him on Lasix 20 mg daily.  He seemed to improve.    Progressive dypnea in fall 2017.  RHC/LHC in 2017 showed mildly elevated left heart filling pressure and nonobstructive CAD.  TEE showed moderate aortic stenosis, bicuspid valve.  Stable ascending aorta dilation.   Echo in 9/18 showed EF 60-65% with bicuspid aortic valve and moderate AS.  CTA chest showed 4.5 cm ascending aorta.  Repeat CTA chest in 8/19 showed stable 4.5 cm ascending aorta.  Echo in 8/20 showed EF 60-65%, moderate AS, 4.5 cm ascending aorta. CTA chest in 8/20 with 4.6 cm ascending aorta. Echo in 1/22 showed EF 60-65%, mild LVH, moderate AS with mean gradient 32 and AVA 1.6 cm^2, bicuspid aortic valve, ascending aorta 46 mm.   He is being treated for bladder cancer with chemotherapy and TURBT.   CTA chest in 8/22 with 4.4 cm ascending aorta.   Echo 3/23 showed EF 60-65% with mild LVH, normal RV, bicuspid aortic valve with severe AS and mean gradient 48 mmHg, AVA 0.91 cm^2.    Follow up 3/23, continued with exertional dyspnea. Referred to [facility] for evaluation of AVR, arranged for [facility] as part of work up. R/LHC (3/23) showed mild CAD, patent RCA stents,  normal PCWP, mildly elevated RA pressure, preserved CO.  Underwent AV replacement and replacement of ascending aorta with Dr. Laneta Simmers 5/23. Had post-op atrial fibrillation, started on amiodarone/warfarin. Presented back to ED 1 week after discharge with LOC. Echo showed moderate pericardial effusion with maximal diameter of 3 cm over the right ventricle with some compression, LV and aortic valve function normal. Attempted pericardiocentesis in cath lab, but unsuccessful and placed sub-xiphoid pericardial window in OR, with 850 cc fluid removed. Discharged home, weight 275 lbs.  Post-op echo in 7/23 showed EF 60-65%, normal RV, stable bioprosthetic aortic valve.   He had hematuria and found to have recurrence of bladder cancer, now getting pembrolizumab infusions.   Echo 8/24 showed EF 60-65%, normal RV, stable bioprosthetic aortic valve  Today he returns for HF follow up. Overall feeling fine. Having difficulty urinating. He is having hematuria with clots. He is under-going prostatectomy and cystectomy later this week. Mild LE edema at night. Denies palpitations, CP, dizziness, or PND/Orthopnea. Appetite ok. No fever or chills. Weight at home 282 pounds. Taking all medications.   ECG (personally reviewed): NSR 64 bpm  Labs (7/19): LDL 68 Labs (8/19): K 5, creatinine 1.09 Labs (8/20): K 4.9, creatinine 1.09, LDL 59, HDL 50 Labs (8/21): K 4.6, creatinine 1.0 Labs (1/22): K 4, creatinine 1.02, LDL 70 Labs (9/22): BNP 18, K 3.8, creatinine 0.94 Labs (  3/23): LDL 61, HDL 48 Labs (6/23): K 4.1, creatinine 1.0, hgb 8.3, Lp(a) 116 Labs (10/23): K 4.0, creatinine 1.34, LDL 71 Labs (7/24): K 3.7, creatinine 1.10, hgb 14.5 Labs (9/24): K 3.7, creatinine 1.19  PMH: 1. OSA: Using CPAP.  2. CAD: Cardiolite 8/12 with inferior infarct and peri-infarct ischemia, had PCI to the mid and distal RCA with Promus DES x 2.  Lexiscan Cardiolite (2/16) with no ischemia or infarction. LHC/RHC (2/16) with mean RA 12,  PA 32/15, mean PCWP 18, CI 3.47; patent mid and distal RCA stents, 50-60% proximal stenosis small PDA.   - LHC/RHC (9/17): 60% proximal PDA; mean RA 2, PA 24/8, mean PCWP 18, CI 2.13.  - Cardiolite (8/17): EF 56%, normal.  - R/LHC (3/23, AVR work up): mid RCA lesion 40% stenosis, previously placed prox RCA to Mid RCA stent (unknown type) widely patent, previously placed dist RCA stent (unknown type) widely patent; mean RA 13, PA 23/8, mean PCWP 11, CI 2.65.  3. Carotid stenosis: 2/15 carotid dopplers 1-39% bilateral ICA stenosis.  4. Colon polyps 5. HTN 6. Hyperlipidemia 7. Abdominal US (10/12) with no AAA 8. Aortic stenosis: Echo (2/15) with EF 60-65%, mild LVH, moderate diastolic dysfunction, normal RV size/systolic function, moderate aortic stenosis with mean gradient 31 and AVA 1.16 cm2, ascending aorta 4.4 cm.  Echo (2/16) with EF 60-65%, grade II diastolic dysfunction, ?bicuspid aortic valve with moderate AS (mean gradient 35 mmHg, AVA 1.5 cm^2), mildly dilated RV with normal systolic function.  TEE (2/16) with bicuspid aortic valve, moderate AS mean gradient 30 mmHg, AVA > 1 cm^2, EF 55-60%, mild LVH, mildly dilated RV with normal systolic function, mild AI, mild MR, ascending aorta 4.2 cm.  - Echo (8/17): EF 60-65%, grade II diastolic dysfunction, bicuspid aortic valve with mean gradient 31 mmHg, probably moderate stenosis.  - TEE (9/17): EF 55-60%, bicuspid aortic valve with moderate AS with mean gradient 24 mmHg and AVA 1.05 cm^2, ascending aorta 4.2 cm.  - Echo (9/18): EF 60-65%, bicuspid aortic valve, AVA 1.05 cm^2 with mean gradient 24 mmHg (moderate AS).  - Echo (8/20): EF 60-65%, mild RV dilation with normal systolic function, moderate AS with AVA 1.46 cm^2 and mean gradient 36 mmHg, 4.5 cm ascending aorta.  - Echo (1/22): EF 60-65%, mild LVH, moderate AS with mean gradient 32 and AVA 1.6 cm^2, bicuspid aortic valve, ascending aorta 46 mm.  - Echo (3/23): EF 60-65% with mild LVH, normal  RV, bicuspid aortic valve with severe AS and mean gradient 48 mmHg, AVA 0.91 cm^2. 4.5 cm ascending aorta.  - s/p bioprosthetic AVR with a 25 mm Edwards Resilia Aortic Valve and Replacement of Ascending Aorta (5/23). - Echo (7/23): EF 60-65%, normal RV, stable bioprosthetic aortic valve.  - Echo (8/24): EF 60-65%, normal RV, stable bioprosthetic valve 9. Ascending aorta aneurysm: 4.4 cm on 2/15 echo. Ascending aorta 4.2 cm TEE 2/16.  Ascending aorta 4.2 cm on 9/17 TEE.  - CTA chest (9/18): 4.5 cm ascending aorta.  - CTA chest (8/19): 4.5 cm ascending aorta - CTA chest (8/20): 4.6 cm ascending aorta - CTA chest (8/22): 4.4 cm ascending aorta - replacement of ascending aorta 5/23. 10. Chronic diastolic CHF.  11. PFTs (4/16) were within normal limits.  12. Lower extremity arterial dopplers normal in 8/17.  13. Bladder cancer: S/p chemo and TURBT 14. Pericardial tamponade with pericardial window s/p AVR in 5/23.  15. Atrial fibrillation: Paroxysmal, noted post-op AVR.   SH: Quit smoking in 2013, married,  works as Curator.  FH: Father with MI, AVR.  Sister with RyR2 gene.   ROS: All systems reviewed and negative except as per HPI.   Current Outpatient Medications  Medication Sig Dispense Refill   acetaminophen (TYLENOL) 325 MG tablet Take 650 mg by mouth as needed.     aspirin 81 MG tablet Take 81 mg by mouth every morning.     carvedilol (COREG) 3.125 MG tablet TAKE 1 TABLET BY MOUTH TWICE A DAY WITH FOOD 180 tablet 1   diazepam (VALIUM) 5 MG tablet Take 1 tablet (5 mg total) by mouth every 12 (twelve) hours as needed for anxiety. 60 tablet 2   Ferrous Sulfate (IRON PO) Take 1 tablet by mouth daily. 1 tab daily     furosemide (LASIX) 20 MG tablet TAKE 60MG  IN THE MORNING AND 40MG  IN THE EVENING 150 tablet 3   ipratropium (ATROVENT) 0.03 % nasal spray Place 2 sprays into both nostrils every 12 (twelve) hours as needed for rhinitis.     JARDIANCE 10 MG TABS tablet TAKE 1 TABLET BY MOUTH  DAILY BEFORE BREAKFAST. 30 tablet 11   KLOR-CON M20 20 MEQ tablet TAKE 2 TABLETS BY MOUTH EVERY DAY 180 tablet 1   Magnesium 500 MG TABS Take 500 mg by mouth daily.     meclizine (ANTIVERT) 25 MG tablet Take 1 tablet (25 mg total) by mouth 3 (three) times daily as needed for dizziness. 30 tablet 0   multivitamin-iron-minerals-folic acid (CENTRUM) chewable tablet Chew 1 tablet by mouth daily.     nitroGLYCERIN (NITROSTAT) 0.4 MG SL tablet Place 0.4 mg under the tongue every 5 (five) minutes as needed for chest pain.     oxyCODONE-acetaminophen (PERCOCET) 5-325 MG tablet Take 1 tablet by mouth every 6 (six) hours as needed for severe pain or moderate pain (post-operatively). 15 tablet 0   phenazopyridine (PYRIDIUM) 200 MG tablet Take 200 mg by mouth every 8 (eight) hours as needed. AZO     rosuvastatin (CRESTOR) 20 MG tablet TAKE 1 TABLET BY MOUTH EVERYDAY AT BEDTIME 90 tablet 3   senna-docusate (SENOKOT-S) 8.6-50 MG tablet Take 1 tablet by mouth 2 (two) times daily. While taking strong pain meds to prevent constipation. 10 tablet 0   tamsulosin (FLOMAX) 0.4 MG CAPS capsule Take 0.4 mg by mouth daily.     venlafaxine XR (EFFEXOR-XR) 150 MG 24 hr capsule TAKE 1 CAPSULE BY MOUTH DAILY WITH BREAKFAST. 90 capsule 0   No current facility-administered medications for this encounter.   Wt Readings from Last 3 Encounters:  02/28/23 129.2 kg (284 lb 12.8 oz)  02/10/23 126.1 kg (278 lb)  01/13/23 128.2 kg (282 lb 9.6 oz)   BP 118/64   Pulse 64   Wt 129.2 kg (284 lb 12.8 oz)   SpO2 91%   BMI 45.97 kg/m   Physical Exam General:  NAD. No resp difficulty, walked into clinic HEENT: Normal Neck: Supple. No JVD, thick neck Carotids 2+ bilat; no bruits. No lymphadenopathy or thryomegaly appreciated. Cor: PMI nondisplaced. Regular rate & rhythm. No rubs, gallops or murmurs. Lungs: Clear Abdomen: Soft, nontender, obese, nondistended. No hepatosplenomegaly. No bruits or masses. Good bowel  sounds. Extremities: No cyanosis, clubbing, rash, trace BLE edema Neuro: Alert & oriented x 3, cranial nerves grossly intact. Moves all 4 extremities w/o difficulty. Affect pleasant.  Assessment/Plan: 1. CAD: Prior PCI to RCA in 8/12.  LHC (9/17) with nonobstructive disease. He was started on Imdur for ?microvascular angina but he developed severe  headaches without any improvement in symptoms.  Underwent coronary angiography (3/23) as part of AVR work up and showed patent RCA stents with nonobstructive mild disease. No chest pain now.  - Continue ASA 81 and Crestor.  2. Aortic stenosis: Bicuspid valve with severe AS on 3/23 echo.  He is now s/p bioprosthetic AV replacement and replacement of ascending aorta 5/23. Echo in 7/23 showed normal bioprosthetic aortic valve. Echo 8/24 showed stable bioprosthetic valve. 3. Hyperlipidemia: Continue Crestor.  4. Ascending aortic aneurysm: Associated with bicuspid aortic valve.  S/p replacement of ascending aorta 5/23. 5. Chronic diastolic CHF: Stable NYHA class II symptoms, he is not significantly volume overloaded on exam.  - Will stop Jardiance in anticipation of bladder removal surgery. Discussed with Dr. Shirlee Latch. With plans for supra-pubic catheter, risks for UTI > benefits at this point. - Continue Lasix 60 mg q am/40 mg q pm. May require higher dose with stopping SGLT2i, but will re-evaluate after his surgery. BMET and BNP today. - Continue carvedilol 3.125 mg bid.  - Avoid ibuprofen use, would use Tylenol for aches/pains.  6. OSA: Continue CPAP.  7. HTN: BP controlled.  8. Pericardial effusion: s/p pericardial window 6/23 post-op.  - Resolved on most recent echo 8/24.  9. Atrial fibrillation: Paroxysmal, only noted post-op.  Anticoagulation was stopped with pericardial effusion and later hematuria.  NSR on ECG today. - If AF recurs, will need DOAC.  10. Bladder CA: Treated with pembrolizumab. As above, planning prostatectomy and cystectomy.  Follow  up in 4 weeks with APP (post surgery follow up) and 4 months with Dr. Kathreen Cornfield [facility] FNP-BC 02/28/2023

## 2023-02-28 NOTE — Patient Instructions (Signed)
Thank you for coming in today  If you had labs drawn today, any labs that are abnormal the clinic will call you No news is good news  Medications: Stop Jardiance   Follow up appointments:  Your physician recommends that you schedule a follow-up appointment in:  4 weeks in clinic  4 months With Dr. Earlean Shawl will receive a reminder letter in the mail a few months in advance. If you don't receive a letter, please call our office to schedule the follow-up appointment.    Do the following things EVERYDAY: Weigh yourself in the morning before breakfast. Write it down and keep it in a log. Take your medicines as prescribed Eat low salt foods--Limit salt (sodium) to 2000 mg per day.  Stay as active as you can everyday Limit all fluids for the day to less than 2 liters   At the [facility], you and your health needs are our priority. As part of our continuing mission to provide you with exceptional heart care, we have created designated Provider Care Teams. These Care Teams include your primary Cardiologist (physician) and Advanced Practice Providers (APPs- Physician Assistants and Nurse Practitioners) who all work together to provide you with the care you need, when you need it.   You may see any of the following providers on your designated Care Team at your next follow up: Dr Arvilla Meres Dr Marca Ancona Dr. Marcos Eke, NP Robbie Lis, [place] [facility] [place], [place] Brynda Peon, NP Karle Plumber, PharmD   Please be sure to bring in all your medications bottles to every appointment.    Thank you for choosing [facility]  If you have any questions or concerns before your next appointment please send [place] a message through [place] or call our office at [phone].    TO LEAVE A MESSAGE FOR THE NURSE SELECT OPTION 2, PLEASE LEAVE A MESSAGE INCLUDING: YOUR NAME DATE OF BIRTH CALL BACK  NUMBER REASON FOR CALL**this is important as we prioritize the call backs  YOU WILL RECEIVE A CALL BACK THE SAME DAY AS LONG AS YOU CALL BEFORE 4:00 PM

## 2023-03-02 ENCOUNTER — Inpatient Hospital Stay (HOSPITAL_COMMUNITY)
Admission: RE | Admit: 2023-03-02 | Discharge: 2023-03-09 | DRG: 654 | Disposition: A | Discharge status: Home Health | Payer: PPO | Attending: Urology | Admitting: Urology

## 2023-03-02 ENCOUNTER — Encounter (HOSPITAL_COMMUNITY): Payer: Self-pay | Admitting: Urology

## 2023-03-02 ENCOUNTER — Other Ambulatory Visit: Payer: Self-pay

## 2023-03-02 DIAGNOSIS — Z9221 Personal history of antineoplastic chemotherapy: Secondary | ICD-10-CM

## 2023-03-02 DIAGNOSIS — C775 Secondary and unspecified malignant neoplasm of intrapelvic lymph nodes: Secondary | ICD-10-CM | POA: Diagnosis present

## 2023-03-02 DIAGNOSIS — I110 Hypertensive heart disease with heart failure: Secondary | ICD-10-CM | POA: Diagnosis not present

## 2023-03-02 DIAGNOSIS — Z9049 Acquired absence of other specified parts of digestive tract: Secondary | ICD-10-CM

## 2023-03-02 DIAGNOSIS — I5032 Chronic diastolic (congestive) heart failure: Secondary | ICD-10-CM | POA: Diagnosis not present

## 2023-03-02 DIAGNOSIS — K429 Umbilical hernia without obstruction or gangrene: Secondary | ICD-10-CM | POA: Diagnosis not present

## 2023-03-02 DIAGNOSIS — K567 Ileus, unspecified: Secondary | ICD-10-CM | POA: Diagnosis not present

## 2023-03-02 DIAGNOSIS — E669 Obesity, unspecified: Secondary | ICD-10-CM | POA: Diagnosis present

## 2023-03-02 DIAGNOSIS — Z801 Family history of malignant neoplasm of trachea, bronchus and lung: Secondary | ICD-10-CM | POA: Diagnosis not present

## 2023-03-02 DIAGNOSIS — Z79899 Other long term (current) drug therapy: Secondary | ICD-10-CM

## 2023-03-02 DIAGNOSIS — G2581 Restless legs syndrome: Secondary | ICD-10-CM | POA: Diagnosis not present

## 2023-03-02 DIAGNOSIS — Z87891 Personal history of nicotine dependence: Secondary | ICD-10-CM

## 2023-03-02 DIAGNOSIS — I480 Paroxysmal atrial fibrillation: Secondary | ICD-10-CM | POA: Diagnosis present

## 2023-03-02 DIAGNOSIS — Z952 Presence of prosthetic heart valve: Secondary | ICD-10-CM

## 2023-03-02 DIAGNOSIS — Z860101 Personal history of adenomatous and serrated colon polyps: Secondary | ICD-10-CM

## 2023-03-02 DIAGNOSIS — C7982 Secondary malignant neoplasm of genital organs: Secondary | ICD-10-CM | POA: Diagnosis not present

## 2023-03-02 DIAGNOSIS — Z800 Family history of malignant neoplasm of digestive organs: Secondary | ICD-10-CM | POA: Diagnosis not present

## 2023-03-02 DIAGNOSIS — D090 Carcinoma in situ of bladder: Secondary | ICD-10-CM | POA: Diagnosis not present

## 2023-03-02 DIAGNOSIS — E6609 Other obesity due to excess calories: Secondary | ICD-10-CM | POA: Diagnosis not present

## 2023-03-02 DIAGNOSIS — F32A Depression, unspecified: Secondary | ICD-10-CM | POA: Diagnosis not present

## 2023-03-02 DIAGNOSIS — I2510 Atherosclerotic heart disease of native coronary artery without angina pectoris: Secondary | ICD-10-CM | POA: Diagnosis not present

## 2023-03-02 DIAGNOSIS — N401 Enlarged prostate with lower urinary tract symptoms: Secondary | ICD-10-CM | POA: Diagnosis not present

## 2023-03-02 DIAGNOSIS — F411 Generalized anxiety disorder: Secondary | ICD-10-CM | POA: Diagnosis not present

## 2023-03-02 DIAGNOSIS — Z955 Presence of coronary angioplasty implant and graft: Secondary | ICD-10-CM | POA: Diagnosis not present

## 2023-03-02 DIAGNOSIS — D0919 Carcinoma in situ of other urinary organs: Secondary | ICD-10-CM | POA: Diagnosis present

## 2023-03-02 DIAGNOSIS — R338 Other retention of urine: Secondary | ICD-10-CM | POA: Diagnosis not present

## 2023-03-02 DIAGNOSIS — Z7982 Long term (current) use of aspirin: Secondary | ICD-10-CM

## 2023-03-02 DIAGNOSIS — Z95820 Peripheral vascular angioplasty status with implants and grafts: Secondary | ICD-10-CM

## 2023-03-02 DIAGNOSIS — C678 Malignant neoplasm of overlapping sites of bladder: Principal | ICD-10-CM | POA: Diagnosis present

## 2023-03-02 DIAGNOSIS — C679 Malignant neoplasm of bladder, unspecified: Secondary | ICD-10-CM | POA: Diagnosis not present

## 2023-03-02 DIAGNOSIS — E785 Hyperlipidemia, unspecified: Secondary | ICD-10-CM | POA: Diagnosis not present

## 2023-03-02 DIAGNOSIS — K420 Umbilical hernia with obstruction, without gangrene: Secondary | ICD-10-CM | POA: Diagnosis not present

## 2023-03-02 DIAGNOSIS — I252 Old myocardial infarction: Secondary | ICD-10-CM | POA: Diagnosis not present

## 2023-03-02 DIAGNOSIS — G4733 Obstructive sleep apnea (adult) (pediatric): Secondary | ICD-10-CM | POA: Diagnosis not present

## 2023-03-02 DIAGNOSIS — Z981 Arthrodesis status: Secondary | ICD-10-CM

## 2023-03-02 LAB — COMPREHENSIVE METABOLIC PANEL
ALT: 23 U/L (ref 0–44)
ALT: 25 U/L (ref 0–44)
AST: 25 U/L (ref 15–41)
AST: 27 U/L (ref 15–41)
Albumin: 3.8 g/dL (ref 3.5–5.0)
Albumin: 4 g/dL (ref 3.5–5.0)
Alkaline Phosphatase: 64 U/L (ref 38–126)
Alkaline Phosphatase: 70 U/L (ref 38–126)
Anion gap: 13 (ref 5–15)
Anion gap: 8 (ref 5–15)
BUN: 21 mg/dL (ref 8–23)
BUN: 19 mg/dL (ref 8–23)
CO2: 25 mmol/L (ref 22–32)
CO2: 26 mmol/L (ref 22–32)
Calcium: 8.7 mg/dL — ABNORMAL LOW (ref 8.9–10.3)
Calcium: 8.5 mg/dL — ABNORMAL LOW (ref 8.9–10.3)
Chloride: 99 mmol/L (ref 98–111)
Chloride: 100 mmol/L (ref 98–111)
Creatinine, Ser: 0.87 mg/dL (ref 0.61–1.24)
Creatinine, Ser: 0.95 mg/dL (ref 0.61–1.24)
GFR, Estimated: >60 mL/min (ref >60)
GFR, Estimated: >60 mL/min (ref >60)
Glucose, Bld: 115 mg/dL — ABNORMAL HIGH (ref 70–99)
Glucose, Bld: 109 mg/dL — ABNORMAL HIGH (ref 70–99)
Potassium: 3.6 mmol/L (ref 3.5–5.1)
Potassium: 3.9 mmol/L (ref 3.5–5.1)
Sodium: 137 mmol/L (ref 135–145)
Sodium: 134 mmol/L — ABNORMAL LOW (ref 135–145)
Total Bilirubin: 1.2 mg/dL — ABNORMAL HIGH (ref <1.2)
Total Bilirubin: 1 mg/dL (ref <1.2)
Total Protein: 7 g/dL (ref 6.5–8.1)
Total Protein: 7.2 g/dL (ref 6.5–8.1)

## 2023-03-02 LAB — CBC
HCT: 39.4 % (ref 39.0–52.0)
HCT: 42.8 % (ref 39.0–52.0)
Hemoglobin: 12.5 g/dL — ABNORMAL LOW (ref 13.0–17.0)
Hemoglobin: 13.3 g/dL (ref 13.0–17.0)
MCH: 29.9 pg (ref 26.0–34.0)
MCH: 29.9 pg (ref 26.0–34.0)
MCHC: 31.7 g/dL (ref 30.0–36.0)
MCHC: 31.1 g/dL (ref 30.0–36.0)
MCV: 94.3 fL (ref 80.0–100.0)
MCV: 96.2 fL (ref 80.0–100.0)
Platelets: 144 10*3/uL — ABNORMAL LOW (ref 150–400)
Platelets: 148 10*3/uL — ABNORMAL LOW (ref 150–400)
RBC: 4.18 MIL/uL — ABNORMAL LOW (ref 4.22–5.81)
RBC: 4.45 MIL/uL (ref 4.22–5.81)
RDW: 14.6 % (ref 11.5–15.5)
RDW: 14.6 % (ref 11.5–15.5)
WBC: 5.4 10*3/uL (ref 4.0–10.5)
WBC: 6.9 10*3/uL (ref 4.0–10.5)
nRBC: 0 % (ref 0.0–0.2)
nRBC: 0 % (ref 0.0–0.2)

## 2023-03-02 LAB — TYPE AND SCREEN
ABO/RH(D): A NEG
Antibody Screen: NEGATIVE

## 2023-03-02 LAB — SURGICAL PCR SCREEN
MRSA, PCR: NEGATIVE
Staphylococcus aureus: NEGATIVE

## 2023-03-02 MED ORDER — CARVEDILOL 3.125 MG PO TABS
3.1250 mg | ORAL_TABLET | Freq: Two times a day (BID) | ORAL | Status: DC
  Administered 2023-03-02 – 2023-03-09 (×12): 3.125 mg via ORAL
  Filled 2023-03-02 (×12): qty 1

## 2023-03-02 MED ORDER — FUROSEMIDE 40 MG PO TABS: 60.0000 mg | ORAL_TABLET | Freq: Every day | ORAL | Status: DC

## 2023-03-02 MED ORDER — FUROSEMIDE 40 MG PO TABS
40.0000 mg | ORAL_TABLET | Freq: Every day | ORAL | Status: DC
  Administered 2023-03-02 – 2023-03-04 (×2): 40 mg via ORAL
  Filled 2023-03-02 (×2): qty 1

## 2023-03-02 MED ORDER — MUPIROCIN 2 % EX OINT
1.0000 | TOPICAL_OINTMENT | Freq: Two times a day (BID) | CUTANEOUS | Status: AC
  Administered 2023-03-02 – 2023-03-07 (×7): 1 via NASAL
  Filled 2023-03-02 (×2): qty 22

## 2023-03-02 MED ORDER — ORAL CARE MOUTH RINSE: 15.0000 mL | OROMUCOSAL | Status: DC | PRN

## 2023-03-02 MED ORDER — PEG 3350-KCL-NA BICARB-NACL 420 G PO SOLR
4000.0000 mL | Freq: Once | ORAL | Status: AC
  Administered 2023-03-02: 4000 mL via ORAL

## 2023-03-02 MED ORDER — VENLAFAXINE HCL ER 150 MG PO CP24
150.0000 mg | ORAL_CAPSULE | Freq: Every day | ORAL | Status: DC
  Administered 2023-03-04 – 2023-03-09 (×6): 150 mg via ORAL
  Filled 2023-03-02 (×6): qty 1

## 2023-03-02 MED ORDER — LACTATED RINGERS IV SOLN: INTRAVENOUS | Status: DC

## 2023-03-02 MED ORDER — PIPERACILLIN-TAZOBACTAM 3.375 G IVPB 30 MIN
3.3750 g | INTRAVENOUS | Status: AC
  Administered 2023-03-03: 3.375 g via INTRAVENOUS
  Filled 2023-03-02: qty 50

## 2023-03-02 MED ORDER — ROSUVASTATIN CALCIUM 20 MG PO TABS
20.0000 mg | ORAL_TABLET | Freq: Every day | ORAL | Status: DC
Start: 2023-03-02 — End: 2023-03-09
  Administered 2023-03-02 – 2023-03-08 (×6): 20 mg via ORAL
  Filled 2023-03-02 (×6): qty 1

## 2023-03-02 MED ORDER — POTASSIUM CHLORIDE CRYS ER 20 MEQ PO TBCR
20.0000 meq | EXTENDED_RELEASE_TABLET | Freq: Two times a day (BID) | ORAL | Status: DC
  Administered 2023-03-02 – 2023-03-09 (×12): 20 meq via ORAL
  Filled 2023-03-02 (×12): qty 1

## 2023-03-02 MED ORDER — INFLUENZA VAC A&B SURF ANT ADJ 0.5 ML IM SUSY
0.5000 mL | PREFILLED_SYRINGE | INTRAMUSCULAR | Status: DC
  Filled 2023-03-02: qty 0.5

## 2023-03-02 NOTE — Anesthesia Preprocedure Evaluation (Addendum)
Anesthesia Evaluation  Patient identified by MRN, date of birth, ID band Patient awake    History of Anesthesia Complications (+) history of anesthetic complications (difficulty waking up after gallbladder surgery ("wild"))  Airway Mallampati: III  TM Distance: >3 FB Neck ROM: Full   Comment: Previous grade I view with MAC 4, easy mask Dental  (+) Dental Advisory Given Only has 3 teeth, all on the top. Denies them being loose.:   Pulmonary neg shortness of breath, sleep apnea and Continuous Positive Airway Pressure Ventilation , neg COPD, neg recent URI, former smoker   Pulmonary exam normal breath sounds clear to auscultation       Cardiovascular hypertension (carvedilol), Pt. on home beta blockers (-) angina + CAD, + Past MI (2012), + Cardiac Stents (2012) and +CHF  + dysrhythmias (postop from AVR) Atrial Fibrillation + Valvular Problems/Murmurs (s/p AVR 09/16/2021) AS  Rhythm:Regular Rate:Normal  HLD, ascending aortic aneurysm, carotid stenosis, s/p pericardial window 09/28/2021  TTE  11/24/2022: IMPRESSIONS     1. Left ventricular ejection fraction, by estimation, is 60 to 65%. The  left ventricle has normal function. The left ventricle has no regional  wall motion abnormalities. There is mild left ventricular hypertrophy.  Left ventricular diastolic parameters  were normal.   2. Right ventricular systolic function is normal. The right ventricular  size is normal. Tricuspid regurgitation signal is inadequate for assessing  PA pressure.   3. The mitral valve is normal in structure. No evidence of mitral valve  regurgitation.   4. No significant PVL.[name] The aortic valve was not well visualized. Aortic  valve regurgitation is not visualized. There is a 25 mm Edwards Inspiris  Resilia valve present in the aortic position. Procedure Date: 09/16/21.   5. The inferior vena cava is normal in size with greater than 50%  respiratory  variability, suggesting right atrial pressure of 3 mmHg.     Neuro/Psych neg Seizures PSYCHIATRIC DISORDERS Anxiety Depression    vertigo    GI/Hepatic Neg liver ROS,GERD  ,,  Endo/Other  neg diabetes  Morbid obesityPre-diabetes  Renal/GU negative Renal ROS   Bladder cancer    Musculoskeletal  (+) Arthritis ,    Abdominal  (+) + obese  Peds  Hematology negative hematology ROS (+) Lab Results      Component                Value               Date                      WBC                      6.2                 01/13/2023                HGB                      14.3                01/13/2023                HCT                      43.0                01/13/2023  MCV                      90.5                01/13/2023                PLT                      135 (L)             01/13/2023              Anesthesia Other Findings   Reproductive/Obstetrics                             Anesthesia Physical Anesthesia Plan  ASA: 3  Anesthesia Plan: General   Post-op Pain Management: Tylenol PO (pre-op)*   Induction: Intravenous  PONV Risk Score and Plan: 2 and Ondansetron, Dexamethasone and Treatment may vary due to age or medical condition  Airway Management Planned: Oral ETT  Additional Equipment: Arterial line  Intra-op Plan:   Post-operative Plan: Extubation in OR  Informed Consent: I have reviewed the patients History and Physical, chart, labs and discussed the procedure including the risks, benefits and alternatives for the proposed anesthesia with the patient or authorized representative who has indicated his/her understanding and acceptance.     Dental advisory given  Plan Discussed with: CRNA and Anesthesiologist  Anesthesia Plan Comments: (Risks of general anesthesia discussed including, but not limited to, sore throat, hoarse voice, chipped/damaged teeth, injury to vocal cords, nausea and vomiting, allergic  reactions, lung infection, heart attack, stroke, and death. All questions answered. )        Anesthesia Quick Evaluation

## 2023-03-02 NOTE — Plan of Care (Addendum)
Patient Direct Admit from home, ambulatory, spouse accompanying him. VSS. No complaints of pain. Provider notified of arrival, orders received. [place] IV placed by VAT, bowel prep initiated. Belongings reviewed (no valuables or meds brought).   Problem: Education: Goal: Knowledge of General Education information will improve Description: Including pain rating scale, medication(s)/side effects and non-pharmacologic comfort measures Outcome: Progressing   Problem: Nutrition: Goal: Adequate nutrition will be maintained Outcome: Progressing

## 2023-03-02 NOTE — Discharge Instructions (Signed)
1- Drain Sites - You may have some mild persistent drainage from old drain site for several days, this is normal. This can be covered with cotton gauze for convenience.  2 - Stiches - Your stitches are all dissolvable. You may notice a "loose thread" at your incisions, these are normal and require no intervention. You may cut them flush to the skin with fingernail clippers if needed for comfort.  3 - Diet - No restrictions  4 - Activity - No heavy lifting / straining (any activities that require valsalva or "bearing down") x 4 weeks. Otherwise, no restrictions.  5 - Bathing - You may shower immediately. Do not take a bath or get into swimming pool where incision sites are submersed in water x 4 weeks.   6 -  When to Call the Doctor - Call MD for any fever >102, any acute wound problems, or any severe nausea / vomiting. You can call the [facility] ([phone]) 24 hours a day 365 days a year. It will roll-over to the answering service and on-call physician after hours.  

## 2023-03-02 NOTE — H&P (Signed)
Luis [name] is an 67 y.o. male.    Chief Complaint: Pre-Op Cystoprostatectomy  HPI:   1 - High Grade Bladder Cancer - long h/o high grade bladder cancer since at least 2021 s/p BCG induction x2, pembolizumab, and multiple recurences notw not endoscopically managable.   ?PMH sig for obesity (most truncal), CAD/Stents/CHF/AVR/Lasix (follows Dr. Sherlie Ban, ASA only now which he has held prior), lap chole, neck surgery, elbow surgery. He is retired Neurosurgeon. His PCP is Novella Olive MD. ??  Today " [name]" is seen to proceed with curative intent cystoprostatectomy and urinary diversion. Cards clearanc on file with recnet favorable echo and ischemic eval. Completed bowel prep to Clear. Hgb 12's, Cr normal.   Past Medical History:  Diagnosis Date   Anxiety    takes Valium as needed   Aortic stenosis, moderate    Arthritis    Ascending aortic aneurysm (HCC)    CAD (coronary artery disease)    a. s/p PCI of the RCA 8/12 with DES by Dr Excell Seltzer, preserved EF. b. LHC/RHC (2/16) with mean RA 12, PA 32/15, mean PCWP 18, CI 3.47; patent mid and distal RCA stents, 50-60% proximal stenosis small PDA.      Cancer [facility])    bladder   Carotid stenosis    a. Carotid US (05/2013):  Bilateral 1-39% ICA; L thyroid nodule (prior hx of aspiration).   Chronic diastolic CHF (congestive heart failure) (HCC)    Complication of anesthesia    difficulty waking up after gallbladder surgery   Depression    Dyspnea    Essential hypertension    GERD (gastroesophageal reflux disease)    if needed will take OTC meds    Heart murmur    History of colonic polyps    hyperplastic   Hyperlipidemia    Joint pain    Lesion of bladder    Myocardial infarction (HCC) 2012   Obesity (BMI 30-39.9) 02/29/2016   Pre-diabetes    Restless leg    Sleep apnea    uses cpap   Tubular adenoma of colon    Vertigo    takes Meclizine as needed    Past Surgical History:  Procedure Laterality Date   AORTIC VALVE  REPLACEMENT N/A 09/16/2021   Procedure: AORTIC VALVE REPLACEMENT (AVR);  Surgeon: Alleen Borne, MD;  Location: [facility] OR;  Service: Open Heart Surgery;  Laterality: N/A;   CATARACT EXTRACTION   4 YRS AGO   BOTH EYES   CHOLECYSTECTOMY  07/21/2011   Procedure: LAPAROSCOPIC CHOLECYSTECTOMY WITH INTRAOPERATIVE CHOLANGIOGRAM;  Surgeon: Kandis Cocking, MD;  Location: [facility] ORS;  Service: General;  Laterality: N/A;   CORONARY ANGIOPLASTY  2012   2 stents   coronary stenting     s/p PCI of the RCA by Dr Excell Seltzer 8/12 with 2 promus stents   CYSTOSCOPY W/ RETROGRADES Bilateral 12/13/2019   Procedure: CYSTOSCOPY WITH RETROGRADE PYELOGRAM;  Surgeon: Sebastian Ache, MD;  Location: [facility];  Service: Urology;  Laterality: Bilateral;   CYSTOSCOPY W/ RETROGRADES Bilateral 10/14/2020   Procedure: CYSTOSCOPY WITH RETROGRADE PYELOGRAM;  Surgeon: Sebastian Ache, MD;  Location: [facility];  Service: Urology;  Laterality: Bilateral;   CYSTOSCOPY W/ RETROGRADES Bilateral 12/16/2020   Procedure: CYSTOSCOPY WITH RETROGRADE PYELOGRAM;  Surgeon: Sebastian Ache, MD;  Location: [facility];  Service: Urology;  Laterality: Bilateral;   CYSTOSCOPY W/ RETROGRADES Bilateral 06/03/2022   Procedure: CYSTOSCOPY WITH RETROGRADE PYELOGRAM;  Surgeon: Sebastian Ache, MD;  Location: [facility]S;  Service: Urology;  Laterality: Bilateral;   CYSTOSCOPY W/ RETROGRADES Bilateral 12/28/2022   Procedure: CYSTOSCOPY WITH RETROGRADE PYELOGRAM, FULGARATION OF BLEEDERS;  Surgeon: Loletta Parish., MD;  Location: [facility];  Service: Urology;  Laterality: Bilateral;   LEFT AND RIGHT HEART CATHETERIZATION WITH CORONARY ANGIOGRAM N/A 06/23/2014   Procedure: LEFT AND RIGHT HEART CATHETERIZATION WITH CORONARY ANGIOGRAM;  Surgeon: Laurey Morale, MD;  Location: [facility] CATH LAB;  Service: Cardiovascular;  Laterality: N/A;   NECK SURGERY  03/23/09   per Dr. Ophelia Charter, cervical fusion    PERICARDIOCENTESIS N/A 09/28/2021    Procedure: PERICARDIOCENTESIS;  Surgeon: Corky Crafts, MD;  Location: [facility] INVASIVE CV LAB;  Service: Cardiovascular;  Laterality: N/A;   REPLACEMENT ASCENDING AORTA N/A 09/16/2021   Procedure: REPLACEMENT ASCENDING AORTA WITH 30 X HEMASHIELD PLATINUM WOVEN DOUBLE VELOUR VASCULAR GRAFT;  Surgeon: Alleen Borne, MD;  Location: [facility] OR;  Service: Open Heart Surgery;  Laterality: N/A;  [facility] ARREST   right elbow surgery     RIGHT HEART CATH AND CORONARY ANGIOGRAPHY N/A 07/08/2021   Procedure: RIGHT HEART CATH AND CORONARY ANGIOGRAPHY;  Surgeon: Laurey Morale, MD;  Location: [facility] INVASIVE CV LAB;  Service: Cardiovascular;  Laterality: N/A;   solonscopy  05/23/08   per Dr. Celene Kras hemorrhoids only, repeat in 5 years   SUBXYPHOID PERICARDIAL WINDOW N/A 09/28/2021   Procedure: SUBXYPHOID PERICARDIAL WINDOW;  Surgeon: Alleen Borne, MD;  Location: [facility] OR;  Service: Thoracic;  Laterality: N/A;   TEE WITHOUT CARDIOVERSION N/A 06/23/2014   Procedure: TRANSESOPHAGEAL ECHOCARDIOGRAM (TEE);  Surgeon: Laurey Morale, MD;  Location: [facility] ENDOSCOPY;  Service: Cardiovascular;  Laterality: N/A;   TEE WITHOUT CARDIOVERSION N/A 01/21/2016   Procedure: TRANSESOPHAGEAL ECHOCARDIOGRAM (TEE);  Surgeon: Laurey Morale, MD;  Location: [facility] ENDOSCOPY;  Service: Cardiovascular;  Laterality: N/A;   TEE WITHOUT CARDIOVERSION N/A 09/16/2021   Procedure: TRANSESOPHAGEAL ECHOCARDIOGRAM (TEE);  Surgeon: Alleen Borne, MD;  Location: [facility] OR;  Service: Open Heart Surgery;  Laterality: N/A;   TEE WITHOUT CARDIOVERSION N/A 09/28/2021   Procedure: TRANSESOPHAGEAL ECHOCARDIOGRAM (TEE);  Surgeon: Alleen Borne, MD;  Location: [facility] OR;  Service: Thoracic;  Laterality: N/A;   TONSILLECTOMY     TRANSURETHRAL RESECTION OF BLADDER TUMOR N/A 10/21/2019   Procedure: TRANSURETHRAL RESECTION OF BLADDER TUMOR (TURBT);  Surgeon: Ihor Gully, MD;  Location: [facility];  Service: Urology;  Laterality: N/A;    TRANSURETHRAL RESECTION OF BLADDER TUMOR N/A 12/13/2019   Procedure: TRANSURETHRAL RESECTION OF BLADDER TUMOR (TURBT);  Surgeon: Sebastian Ache, MD;  Location: [facility];  Service: Urology;  Laterality: N/A;  1 HR   TRANSURETHRAL RESECTION OF BLADDER TUMOR N/A 10/14/2020   Procedure: TRANSURETHRAL RESECTION OF BLADDER TUMOR (TURBT);  Surgeon: Sebastian Ache, MD;  Location: [facility];  Service: Urology;  Laterality: N/A;   TRANSURETHRAL RESECTION OF BLADDER TUMOR N/A 12/16/2020   Procedure: RESTAGING TRANSURETHRAL RESECTION OF BLADDER TUMOR (TURBT);  Surgeon: Sebastian Ache, MD;  Location: [facility];  Service: Urology;  Laterality: N/A;   TRANSURETHRAL RESECTION OF BLADDER TUMOR N/A 06/03/2022   Procedure: TRANSURETHRAL RESECTION OF BLADDER TUMOR (TURBT);  Surgeon: Sebastian Ache, MD;  Location: [facility];  Service: Urology;  Laterality: N/A;    Family History  Problem Relation Age of Onset   Lung cancer Mother        lung   Esophageal cancer Cousin    Colon cancer Neg Hx    Rectal cancer Neg Hx  Stomach cancer Neg Hx    Social History:  reports that he quit smoking about 12 years ago. His smoking use included cigarettes. He started smoking about 52 years ago. He has a 80 pack-year smoking history. He has never used smokeless tobacco. He reports that he does not drink alcohol and does not use drugs.  Allergies: No Known Allergies  Medications Prior to Admission  Medication Sig Dispense Refill   acetaminophen (TYLENOL) 325 MG tablet Take 650 mg by mouth every 6 (six) hours as needed (pain.).     aspirin EC 81 MG tablet Take 81 mg by mouth in the morning.     carvedilol (COREG) 3.125 MG tablet TAKE 1 TABLET BY MOUTH TWICE A DAY WITH FOOD 180 tablet 1   diazepam (VALIUM) 5 MG tablet Take 1 tablet (5 mg total) by mouth every 12 (twelve) hours as needed for anxiety. 60 tablet 2   Ferrous Sulfate (IRON PO) Take 1 tablet by mouth in the morning. 1  tab daily     furosemide (LASIX) 20 MG tablet TAKE 60MG  IN THE MORNING AND 40MG  IN THE EVENING 150 tablet 3   ipratropium (ATROVENT) 0.03 % nasal spray Place 2 sprays into both nostrils every 12 (twelve) hours as needed for rhinitis.     KLOR-CON M20 20 MEQ tablet TAKE 2 TABLETS BY MOUTH EVERY DAY (Patient taking differently: Take 20 mEq by mouth 2 (two) times daily.) 180 tablet 1   Magnesium 500 MG TABS Take 500 mg by mouth in the morning.     meclizine (ANTIVERT) 25 MG tablet Take 1 tablet (25 mg total) by mouth 3 (three) times daily as needed for dizziness. 30 tablet 0   multivitamin-iron-minerals-folic acid (CENTRUM) chewable tablet Chew 1 tablet by mouth daily.     phenazopyridine (PYRIDIUM) 200 MG tablet Take 200 mg by mouth every 8 (eight) hours as needed for pain. AZO     rosuvastatin (CRESTOR) 20 MG tablet TAKE 1 TABLET BY MOUTH EVERYDAY AT BEDTIME 90 tablet 3   tamsulosin (FLOMAX) 0.4 MG CAPS capsule Take 0.4 mg by mouth daily.     venlafaxine XR (EFFEXOR-XR) 150 MG 24 hr capsule TAKE 1 CAPSULE BY MOUTH DAILY WITH BREAKFAST. 90 capsule 0   nitroGLYCERIN (NITROSTAT) 0.4 MG SL tablet Place 0.4 mg under the tongue every 5 (five) minutes as needed for chest pain.      No results found for this or any previous visit (from the past 48 hour(s)). No results found.  Review of Systems  Constitutional:  Negative for chills and fever.  All other systems reviewed and are negative.   There were no vitals taken for this visit. Physical Exam Vitals reviewed.  Constitutional:      Comments: Pleasant, wife at bedside. Mild anxious as expected.   HENT:     Head: Normocephalic.  Eyes:     Pupils: Pupils are equal, round, and reactive to light.  Cardiovascular:     Rate and Rhythm: Normal rate.  Pulmonary:     Effort: Pulmonary effort is normal.  Abdominal:     General: Abdomen is flat.     Comments: Stable truncal obesity. Reducibel umbilical hernia.   Genitourinary:    Comments: No CVAT  at present.  Musculoskeletal:        General: Normal range of motion.     Cervical back: Normal range of motion.  Skin:    General: Skin is warm.  Neurological:     General: No focal  deficit present.     Mental Status: He is alert.  Psychiatric:        Mood and Affect: Mood normal.      Assessment/Plan  Proceed as planned with cystoprostatectomy. We have exhausted all other reasonable options for his recurent high grade bladder cancer. RIsks (including mortality), benefits, alternatives, expected peri-op course dsicussed previously over many occasions and reiterated today. He understands that his CV, metabolic, and pulm comorbidity increases risk of nearly all peri-op complications includign infection, MI, CVA, and mortality.     Loletta Parish., MD 03/02/2023, 4:08 PM

## 2023-03-02 NOTE — Progress Notes (Signed)
Pt set up on CPAP using his mask and tubing from home.  Patient states he will self administer CPAP when he is ready for bed.

## 2023-03-03 ENCOUNTER — Other Ambulatory Visit: Payer: Self-pay

## 2023-03-03 ENCOUNTER — Inpatient Hospital Stay (HOSPITAL_COMMUNITY): Payer: PPO | Admitting: Certified Registered"

## 2023-03-03 ENCOUNTER — Encounter (HOSPITAL_COMMUNITY)
Admission: RE | Disposition: A | Discharge status: Home Health | Payer: Self-pay | Source: Home / Self Care | Attending: Urology

## 2023-03-03 ENCOUNTER — Encounter (HOSPITAL_COMMUNITY): Payer: Self-pay | Admitting: Urology

## 2023-03-03 DIAGNOSIS — C679 Malignant neoplasm of bladder, unspecified: Secondary | ICD-10-CM

## 2023-03-03 HISTORY — PX: ROBOT ASSISTED LAPAROSCOPIC RADICAL PROSTATECTOMY: SHX5141

## 2023-03-03 HISTORY — PX: ROBOT ASSISTED LAPAROSCOPIC COMPLETE CYSTECT ILEAL CONDUIT: SHX5139

## 2023-03-03 HISTORY — PX: UMBILICAL HERNIA REPAIR: SHX196

## 2023-03-03 HISTORY — PX: CYSTOSCOPY WITH INJECTION: SHX1424

## 2023-03-03 LAB — POCT I-STAT 7, (LYTES, BLD GAS, ICA,H+H)
Acid-base deficit: 3 mmol/L — ABNORMAL HIGH (ref 0.0–2.0)
Bicarbonate: 22.8 mmol/L (ref 20.0–28.0)
Calcium, Ion: 1.11 mmol/L — ABNORMAL LOW (ref 1.15–1.40)
HCT: 32 % — ABNORMAL LOW (ref 39.0–52.0)
Hemoglobin: 10.9 g/dL — ABNORMAL LOW (ref 13.0–17.0)
O2 Saturation: 99 %
Potassium: 4.4 mmol/L (ref 3.5–5.1)
Sodium: 135 mmol/L (ref 135–145)
TCO2: 24 mmol/L (ref 22–32)
pCO2 arterial: 43.9 mm[Hg] (ref 32–48)
pH, Arterial: 7.323 — ABNORMAL LOW (ref 7.35–7.45)
pO2, Arterial: 143 mm[Hg] — ABNORMAL HIGH (ref 83–108)

## 2023-03-03 LAB — HEMOGLOBIN AND HEMATOCRIT, BLOOD
HCT: 36.1 % — ABNORMAL LOW (ref 39.0–52.0)
Hemoglobin: 11.3 g/dL — ABNORMAL LOW (ref 13.0–17.0)

## 2023-03-03 SURGERY — CYSTOSCOPY, WITH INJECTION OF BLADDER NECK OR BLADDER WALL
Anesthesia: General

## 2023-03-03 MED ORDER — BUPIVACAINE LIPOSOME 1.3 % IJ SUSP
INTRAMUSCULAR | Status: AC
  Filled 2023-03-03: qty 20

## 2023-03-03 MED ORDER — MIDAZOLAM HCL 2 MG/2ML IJ SOLN
INTRAMUSCULAR | Status: DC | PRN
  Administered 2023-03-03: 2 mg via INTRAVENOUS

## 2023-03-03 MED ORDER — PROPOFOL 10 MG/ML IV BOLUS
INTRAVENOUS | Status: DC | PRN
  Administered 2023-03-03: 150 mg via INTRAVENOUS
  Administered 2023-03-03: 50 mg via INTRAVENOUS

## 2023-03-03 MED ORDER — WATER FOR IRRIGATION, STERILE IR SOLN
Status: DC | PRN
  Administered 2023-03-03: 1500 mL

## 2023-03-03 MED ORDER — LACTATED RINGERS IV SOLN: INTRAVENOUS | Status: DC | PRN

## 2023-03-03 MED ORDER — SODIUM CHLORIDE 0.9 % IV SOLN: INTRAVENOUS | Status: AC

## 2023-03-03 MED ORDER — LIDOCAINE 2% (20 MG/ML) 5 ML SYRINGE
INTRAMUSCULAR | Status: DC | PRN
  Administered 2023-03-03: 100 mg via INTRAVENOUS

## 2023-03-03 MED ORDER — MIDAZOLAM HCL 2 MG/2ML IJ SOLN
INTRAMUSCULAR | Status: AC
  Filled 2023-03-03: qty 2

## 2023-03-03 MED ORDER — ROCURONIUM BROMIDE 10 MG/ML (PF) SYRINGE
PREFILLED_SYRINGE | INTRAVENOUS | Status: DC | PRN
  Administered 2023-03-03: 20 mg via INTRAVENOUS
  Administered 2023-03-03: 70 mg via INTRAVENOUS
  Administered 2023-03-03 (×2): 30 mg via INTRAVENOUS
  Administered 2023-03-03: 10 mg via INTRAVENOUS
  Administered 2023-03-03 (×3): 30 mg via INTRAVENOUS

## 2023-03-03 MED ORDER — HYDROMORPHONE HCL 1 MG/ML IJ SOLN
0.2500 mg | INTRAMUSCULAR | Status: DC | PRN
  Administered 2023-03-03 (×3): 0.5 mg via INTRAVENOUS

## 2023-03-03 MED ORDER — FENTANYL CITRATE (PF) 100 MCG/2ML IJ SOLN
INTRAMUSCULAR | Status: DC | PRN
  Administered 2023-03-03 (×2): 50 ug via INTRAVENOUS
  Administered 2023-03-03: 100 ug via INTRAVENOUS
  Administered 2023-03-03: 50 ug via INTRAVENOUS

## 2023-03-03 MED ORDER — OXYCODONE HCL 5 MG PO TABS
5.0000 mg | ORAL_TABLET | Freq: Once | ORAL | Status: AC | PRN
  Administered 2023-03-03: 5 mg via ORAL

## 2023-03-03 MED ORDER — ROCURONIUM BROMIDE 10 MG/ML (PF) SYRINGE
PREFILLED_SYRINGE | INTRAVENOUS | Status: AC
  Filled 2023-03-03: qty 10

## 2023-03-03 MED ORDER — AMISULPRIDE (ANTIEMETIC) 5 MG/2ML IV SOLN: 10.0000 mg | Freq: Once | INTRAVENOUS | Status: DC | PRN

## 2023-03-03 MED ORDER — KETAMINE HCL 10 MG/ML IJ SOLN
INTRAMUSCULAR | Status: DC | PRN
  Administered 2023-03-03: 20 mg via INTRAVENOUS
  Administered 2023-03-03 (×3): 10 mg via INTRAVENOUS

## 2023-03-03 MED ORDER — ACETAMINOPHEN 500 MG PO TABS
ORAL_TABLET | ORAL | Status: AC
  Filled 2023-03-03: qty 2

## 2023-03-03 MED ORDER — SODIUM CHLORIDE (PF) 0.9 % IJ SOLN
INTRAMUSCULAR | Status: AC
  Filled 2023-03-03: qty 20

## 2023-03-03 MED ORDER — PIPERACILLIN-TAZOBACTAM 3.375 G IVPB
3.3750 g | Freq: Three times a day (TID) | INTRAVENOUS | Status: AC
  Administered 2023-03-03 – 2023-03-04 (×3): 3.375 g via INTRAVENOUS
  Filled 2023-03-03 (×3): qty 50

## 2023-03-03 MED ORDER — ALBUMIN HUMAN 5 % IV SOLN
INTRAVENOUS | Status: AC
  Filled 2023-03-03: qty 500

## 2023-03-03 MED ORDER — SUGAMMADEX SODIUM 200 MG/2ML IV SOLN
INTRAVENOUS | Status: DC | PRN
  Administered 2023-03-03: 300 mg via INTRAVENOUS
  Administered 2023-03-03: 100 mg via INTRAVENOUS

## 2023-03-03 MED ORDER — DEXAMETHASONE SODIUM PHOSPHATE 10 MG/ML IJ SOLN
INTRAMUSCULAR | Status: AC
Start: 2023-03-03 — End: ?
  Filled 2023-03-03: qty 1

## 2023-03-03 MED ORDER — ACETAMINOPHEN 10 MG/ML IV SOLN
1000.0000 mg | Freq: Four times a day (QID) | INTRAVENOUS | Status: AC
  Administered 2023-03-03 – 2023-03-04 (×4): 1000 mg via INTRAVENOUS
  Filled 2023-03-03 (×4): qty 100

## 2023-03-03 MED ORDER — ALBUMIN HUMAN 5 % IV SOLN: INTRAVENOUS | Status: DC | PRN

## 2023-03-03 MED ORDER — OXYCODONE HCL 5 MG/5ML PO SOLN: 5.0000 mg | Freq: Once | ORAL | Status: AC | PRN

## 2023-03-03 MED ORDER — HYDROMORPHONE HCL 1 MG/ML IJ SOLN
0.5000 mg | INTRAMUSCULAR | Status: DC | PRN
  Administered 2023-03-03 – 2023-03-06 (×15): 1 mg via INTRAVENOUS
  Filled 2023-03-03 (×15): qty 1

## 2023-03-03 MED ORDER — ONDANSETRON HCL 4 MG/2ML IJ SOLN
INTRAMUSCULAR | Status: AC
  Filled 2023-03-03: qty 2

## 2023-03-03 MED ORDER — ACETAMINOPHEN 500 MG PO TABS
1000.0000 mg | ORAL_TABLET | Freq: Once | ORAL | Status: AC
  Administered 2023-03-03: 1000 mg via ORAL

## 2023-03-03 MED ORDER — ACETAMINOPHEN 10 MG/ML IV SOLN
1000.0000 mg | Freq: Once | INTRAVENOUS | Status: AC
  Administered 2023-03-03: 1000 mg via INTRAVENOUS

## 2023-03-03 MED ORDER — HYDROMORPHONE HCL 1 MG/ML IJ SOLN
INTRAMUSCULAR | Status: AC
  Filled 2023-03-03: qty 1

## 2023-03-03 MED ORDER — FENTANYL CITRATE (PF) 250 MCG/5ML IJ SOLN
INTRAMUSCULAR | Status: AC
  Filled 2023-03-03: qty 5

## 2023-03-03 MED ORDER — ONDANSETRON HCL 4 MG/2ML IJ SOLN
4.0000 mg | INTRAMUSCULAR | Status: DC | PRN
Start: 2023-03-03 — End: 2023-03-09

## 2023-03-03 MED ORDER — DIPHENHYDRAMINE HCL 50 MG/ML IJ SOLN: 12.5000 mg | Freq: Four times a day (QID) | INTRAMUSCULAR | Status: DC | PRN

## 2023-03-03 MED ORDER — HYDROMORPHONE HCL 2 MG/ML IJ SOLN
INTRAMUSCULAR | Status: AC
  Filled 2023-03-03: qty 1

## 2023-03-03 MED ORDER — KETAMINE HCL 10 MG/ML IJ SOLN
INTRAMUSCULAR | Status: AC
  Filled 2023-03-03: qty 1

## 2023-03-03 MED ORDER — STERILE WATER FOR INJECTION IJ SOLN
INTRAMUSCULAR | Status: DC | PRN
  Administered 2023-03-03: 5 mL via INTRAMUSCULAR

## 2023-03-03 MED ORDER — DEXAMETHASONE SODIUM PHOSPHATE 10 MG/ML IJ SOLN
INTRAMUSCULAR | Status: DC | PRN
  Administered 2023-03-03: 4 mg via INTRAVENOUS

## 2023-03-03 MED ORDER — PHENYLEPHRINE HCL-NACL 20-0.9 MG/250ML-% IV SOLN
INTRAVENOUS | Status: DC | PRN
  Administered 2023-03-03: 40 ug/min via INTRAVENOUS

## 2023-03-03 MED ORDER — SODIUM CHLORIDE (PF) 0.9 % IJ SOLN
INTRAMUSCULAR | Status: DC | PRN
  Administered 2023-03-03: 20 mL

## 2023-03-03 MED ORDER — BUPIVACAINE LIPOSOME 1.3 % IJ SUSP
INTRAMUSCULAR | Status: DC | PRN
  Administered 2023-03-03: 20 mL

## 2023-03-03 MED ORDER — DIPHENHYDRAMINE HCL 12.5 MG/5ML PO ELIX
12.5000 mg | ORAL_SOLUTION | Freq: Four times a day (QID) | ORAL | Status: DC | PRN
  Administered 2023-03-08: 12.5 mg via ORAL
  Filled 2023-03-03: qty 5

## 2023-03-03 MED ORDER — OXYCODONE HCL 5 MG PO TABS
5.0000 mg | ORAL_TABLET | ORAL | Status: DC | PRN
  Administered 2023-03-04 – 2023-03-08 (×6): 5 mg via ORAL
  Filled 2023-03-03 (×7): qty 1

## 2023-03-03 MED ORDER — HYDROMORPHONE HCL 1 MG/ML IJ SOLN
INTRAMUSCULAR | Status: AC
  Administered 2023-03-03: 0.5 mg via INTRAVENOUS
  Filled 2023-03-03: qty 2

## 2023-03-03 MED ORDER — HYDROMORPHONE HCL 1 MG/ML IJ SOLN
0.5000 mg | INTRAMUSCULAR | Status: AC | PRN
  Administered 2023-03-03 (×2): 0.5 mg via INTRAVENOUS

## 2023-03-03 MED ORDER — SODIUM CHLORIDE 0.9 % IV SOLN: INTRAVENOUS | Status: DC | PRN

## 2023-03-03 MED ORDER — SENNOSIDES-DOCUSATE SODIUM 8.6-50 MG PO TABS
2.0000 | ORAL_TABLET | Freq: Every day | ORAL | Status: DC
  Administered 2023-03-04 – 2023-03-08 (×5): 2 via ORAL
  Filled 2023-03-03 (×5): qty 2

## 2023-03-03 MED ORDER — CHLORHEXIDINE GLUCONATE 0.12 % MT SOLN
15.0000 mL | Freq: Once | OROMUCOSAL | Status: AC
  Administered 2023-03-03: 15 mL via OROMUCOSAL

## 2023-03-03 MED ORDER — PROPOFOL 10 MG/ML IV BOLUS
INTRAVENOUS | Status: AC
  Filled 2023-03-03: qty 20

## 2023-03-03 MED ORDER — OXYCODONE HCL 5 MG PO TABS
ORAL_TABLET | ORAL | Status: AC
  Filled 2023-03-03: qty 1

## 2023-03-03 MED ORDER — HYDROMORPHONE HCL 1 MG/ML IJ SOLN
INTRAMUSCULAR | Status: DC | PRN
  Administered 2023-03-03 (×2): .25 mg via INTRAVENOUS
  Administered 2023-03-03: .5 mg via INTRAVENOUS

## 2023-03-03 MED ORDER — STERILE WATER FOR INJECTION IJ SOLN
INTRAMUSCULAR | Status: AC
  Filled 2023-03-03: qty 10

## 2023-03-03 MED ORDER — ONDANSETRON HCL 4 MG/2ML IJ SOLN
INTRAMUSCULAR | Status: DC | PRN
  Administered 2023-03-03: 4 mg via INTRAVENOUS

## 2023-03-03 MED ORDER — ACETAMINOPHEN 10 MG/ML IV SOLN
INTRAVENOUS | Status: AC
  Filled 2023-03-03: qty 100

## 2023-03-03 MED ORDER — SODIUM CHLORIDE 0.9 % IR SOLN
Status: DC | PRN
  Administered 2023-03-03: 1000 mL via INTRAVESICAL

## 2023-03-03 SURGICAL SUPPLY — 130 items
ADH SKN CLS APL DERMABOND .7 (GAUZE/BANDAGES/DRESSINGS) ×4
AGENT HMST KT MTR STRL THRMB (HEMOSTASIS)
APL ESCP 34 STRL LF DISP (HEMOSTASIS)
APL PRP STRL LF DISP 70% ISPRP (MISCELLANEOUS) ×2
APL SKNCLS STERI-STRIP NONHPOA (GAUZE/BANDAGES/DRESSINGS)
APL SWBSTK 6 STRL LF DISP (MISCELLANEOUS) ×2
APPLICATOR COTTON TIP 6 STRL (MISCELLANEOUS) ×2 IMPLANT
APPLICATOR COTTON TIP 6IN STRL (MISCELLANEOUS) ×2
APPLICATOR SURGIFLO ENDO (HEMOSTASIS) IMPLANT
BAG COUNTER SPONGE SURGICOUNT (BAG) IMPLANT
BAG LAPAROSCOPIC 12 15 PORT 16 (BASKET) ×2 IMPLANT
BAG RETRIEVAL 12/15 (BASKET) ×2
BAG SPNG CNTER NS LX DISP (BAG)
BAG URO CATCHER STRL LF (MISCELLANEOUS) ×2 IMPLANT
BENZOIN TINCTURE PRP APPL 2/3 (GAUZE/BANDAGES/DRESSINGS) IMPLANT
BLADE SURG SZ10 CARB STEEL (BLADE) IMPLANT
CATH FOLEY 2WAY SLVR 18FR 30CC (CATHETERS) ×2 IMPLANT
CATH SILICONE 5CC 18FR (INSTRUMENTS) ×2 IMPLANT
CATH TIEMANN FOLEY 18FR 5CC (CATHETERS) ×2 IMPLANT
CHLORAPREP W/TINT 26 (MISCELLANEOUS) ×2 IMPLANT
CLIP LIGATING HEM O LOK PURPLE (MISCELLANEOUS) ×4 IMPLANT
CLIP LIGATING HEMO LOK XL GOLD (MISCELLANEOUS) ×4 IMPLANT
CLIP LIGATING HEMO O LOK GREEN (MISCELLANEOUS) ×2 IMPLANT
CLOTH BEACON ORANGE TIMEOUT ST (SAFETY) ×2 IMPLANT
CNTNR URN SCR LID CUP LEK RST (MISCELLANEOUS) ×2 IMPLANT
CONT SPEC 4OZ STRL OR WHT (MISCELLANEOUS) ×2
COVER SURGICAL LIGHT HANDLE (MISCELLANEOUS) ×2 IMPLANT
COVER TIP SHEARS 8 DVNC (MISCELLANEOUS) ×2 IMPLANT
CUTTER ECHEON FLEX ENDO 45 340 (ENDOMECHANICALS) ×2 IMPLANT
DERMABOND ADVANCED .7 DNX12 (GAUZE/BANDAGES/DRESSINGS) ×4 IMPLANT
DRAIN CHANNEL RND F F (WOUND CARE) IMPLANT
DRAIN PENROSE 0.5X18 (DRAIN) IMPLANT
DRAPE ARM DVNC X/XI (DISPOSABLE) ×8 IMPLANT
DRAPE COLUMN DVNC XI (DISPOSABLE) ×2 IMPLANT
DRAPE SURG IRRIG POUCH 19X23 (DRAPES) ×2 IMPLANT
DRIVER NDL LRG 8 DVNC XI (INSTRUMENTS) ×4 IMPLANT
DRIVER NDLE LRG 8 DVNC XI (INSTRUMENTS) ×4
DRSG TEGADERM 4X4.75 (GAUZE/BANDAGES/DRESSINGS) ×2 IMPLANT
ELECT PENCIL ROCKER SW 15FT (MISCELLANEOUS) ×2 IMPLANT
ELECT REM PT RETURN 15FT ADLT (MISCELLANEOUS) ×2 IMPLANT
FORCEPS BPLR LNG DVNC XI (INSTRUMENTS) ×2 IMPLANT
FORCEPS PROGRASP DVNC XI (FORCEP) ×2 IMPLANT
GAUZE 4X4 16PLY ~~LOC~~+RFID DBL (SPONGE) IMPLANT
GAUZE SPONGE 2X2 8PLY STRL LF (GAUZE/BANDAGES/DRESSINGS) IMPLANT
GAUZE SPONGE 4X4 12PLY STRL (GAUZE/BANDAGES/DRESSINGS) ×2 IMPLANT
GLOVE BIO SURGEON STRL SZ 6.5 (GLOVE) ×4 IMPLANT
GLOVE BIOGEL PI IND STRL 7.5 (GLOVE) ×2 IMPLANT
GLOVE SURG LX STRL 7.5 STRW (GLOVE) ×6 IMPLANT
GOWN SRG XL LVL 4 BRTHBL STRL (GOWNS) ×2 IMPLANT
GOWN STRL NON-REIN XL LVL4 (GOWNS) ×2
GOWN STRL REUS W/ TWL XL LVL3 (GOWN DISPOSABLE) ×6 IMPLANT
GOWN STRL REUS W/TWL XL LVL3 (GOWN DISPOSABLE) ×6
GOWN STRL SURGICAL XL XLNG (GOWN DISPOSABLE) ×2 IMPLANT
HOLDER FOLEY CATH W/STRAP (MISCELLANEOUS) ×2 IMPLANT
IRRIG SUCT STRYKERFLOW 2 WTIP (MISCELLANEOUS) ×2
IRRIGATION SUCT STRKRFLW 2 WTP (MISCELLANEOUS) ×2 IMPLANT
IV LACTATED RINGERS 1000ML (IV SOLUTION) ×2 IMPLANT
KIT PROCEDURE DVNC SI (MISCELLANEOUS) ×2 IMPLANT
KIT TURNOVER KIT A (KITS) IMPLANT
LOOP VESSEL MAXI BLUE (MISCELLANEOUS) ×2 IMPLANT
MANIFOLD NEPTUNE II (INSTRUMENTS) ×2 IMPLANT
NDL ASPIRATION 22 (NEEDLE) ×2 IMPLANT
NDL INSUFFLATION 14GA 120MM (NEEDLE) ×2 IMPLANT
NDL SPNL 22GX7 QUINCKE BK (NEEDLE) ×2 IMPLANT
NEEDLE ASPIRATION 22 (NEEDLE) ×2
NEEDLE INSUFFLATION 14GA 120MM (NEEDLE) ×2
NEEDLE SPNL 22GX7 QUINCKE BK (NEEDLE) ×2
PACK CYSTO (CUSTOM PROCEDURE TRAY) ×2 IMPLANT
PACK ROBOT UROLOGY CUSTOM (CUSTOM PROCEDURE TRAY) ×2 IMPLANT
PAD POSITIONING PINK XL (MISCELLANEOUS) ×2 IMPLANT
PAD PREP 24X48 CUFFED NSTRL (MISCELLANEOUS) ×2 IMPLANT
PLUG CATH AND CAP STRL 200 (CATHETERS) ×2 IMPLANT
PORT ACCESS TROCAR AIRSEAL 12 (TROCAR) ×2 IMPLANT
RELOAD STAPLE 45 4.1 GRN THCK (STAPLE) ×2 IMPLANT
RELOAD STAPLE 60 2.6 WHT THN (STAPLE) ×6 IMPLANT
RELOAD STAPLE 60 4.1 GRN THCK (STAPLE) ×6 IMPLANT
RETRACTOR LONRSTAR 16.6X16.6CM (MISCELLANEOUS) IMPLANT
RETRACTOR STAY HOOK 5MM (MISCELLANEOUS) IMPLANT
RETRACTOR STER APS 16.6X16.6CM (MISCELLANEOUS)
RETRACTOR WND ALEXIS 18 MED (MISCELLANEOUS) ×2 IMPLANT
RTRCTR WOUND ALEXIS 18CM MED (MISCELLANEOUS) ×2
SCISSORS MNPLR CVD DVNC XI (INSTRUMENTS) ×2 IMPLANT
SEAL UNIV 5-12 XI (MISCELLANEOUS) ×8 IMPLANT
SET CYSTO W/LG BORE CLAMP LF (SET/KITS/TRAYS/PACK) IMPLANT
SET TRI-LUMEN FLTR TB AIRSEAL (TUBING) ×2 IMPLANT
SOL ELECTROSURG ANTI STICK (MISCELLANEOUS) ×2
SOL PREP POV-IOD 4OZ 10% (MISCELLANEOUS) ×2 IMPLANT
SOLUTION ELECTROSURG ANTI STCK (MISCELLANEOUS) ×2 IMPLANT
SPIKE FLUID TRANSFER (MISCELLANEOUS) ×2 IMPLANT
SPONGE T-LAP 4X18 ~~LOC~~+RFID (SPONGE) ×2 IMPLANT
STAPLE RELOAD 45 GRN (STAPLE) ×2
STAPLER ECHELON LONG 60 440 (INSTRUMENTS) ×2 IMPLANT
STAPLER RELOAD GREEN 60MM (STAPLE) ×6
STAPLER RELOAD WHITE 60MM (STAPLE) ×6
STENT SET URETHERAL LEFT 7FR (STENTS) ×2 IMPLANT
STENT SET URETHERAL RIGHT 7FR (STENTS) ×2 IMPLANT
SURGIFLO W/THROMBIN 8M KIT (HEMOSTASIS) IMPLANT
SUT CHROMIC 4 0 RB 1X27 (SUTURE) ×2 IMPLANT
SUT ETHILON 3 0 PS 1 (SUTURE) ×2 IMPLANT
SUT MNCRL AB 4-0 PS2 18 (SUTURE) ×4 IMPLANT
SUT NOVA 0 T19/GS 22DT (SUTURE) IMPLANT
SUT PDS AB 1 CT1 27 (SUTURE) ×6 IMPLANT
SUT SILK 3 0 SH 30 (SUTURE) IMPLANT
SUT SILK 3 0 SH CR/8 (SUTURE) ×2 IMPLANT
SUT VIC AB 2-0 CT1 27 (SUTURE)
SUT VIC AB 2-0 CT1 27XBRD (SUTURE) IMPLANT
SUT VIC AB 2-0 SH 18 (SUTURE) IMPLANT
SUT VIC AB 2-0 SH 27 (SUTURE)
SUT VIC AB 2-0 SH 27X BRD (SUTURE) ×2 IMPLANT
SUT VIC AB 2-0 UR5 27 (SUTURE) ×8 IMPLANT
SUT VIC AB 3-0 SH 27 (SUTURE) ×12
SUT VIC AB 3-0 SH 27X BRD (SUTURE) ×4 IMPLANT
SUT VIC AB 3-0 SH 27XBRD (SUTURE) ×8 IMPLANT
SUT VIC AB 4-0 RB1 27 (SUTURE) ×8
SUT VIC AB 4-0 RB1 27XBRD (SUTURE) ×8 IMPLANT
SUT VICRYL 0 UR6 27IN ABS (SUTURE) ×2 IMPLANT
SUT VLOC BARB 180 ABS3/0GR12 (SUTURE) ×2
SUTURE VLOC BRB 180 ABS3/0GR12 (SUTURE) ×6 IMPLANT
SYR 27GX1/2 1ML LL SAFETY (SYRINGE) ×2 IMPLANT
SYR CONTROL 10ML LL (SYRINGE) IMPLANT
SYR TOOMEY IRRIG 70ML (MISCELLANEOUS) ×2
SYRINGE TOOMEY IRRIG 70ML (MISCELLANEOUS) IMPLANT
SYS KII OPTICAL ACCESS 15MM (TROCAR) ×2
SYSTEM KII OPTICAL ACCESS 15MM (TROCAR) ×2 IMPLANT
SYSTEM UROSTOMY GENTLE TOUCH (WOUND CARE) ×2 IMPLANT
TOWEL OR NON WOVEN STRL DISP B (DISPOSABLE) ×2 IMPLANT
TROCAR Z-THREAD FIOS 5X100MM (TROCAR) IMPLANT
TUBING CONNECTING 10 (TUBING) ×2 IMPLANT
WATER STERILE IRR 1000ML POUR (IV SOLUTION) ×2 IMPLANT
WATER STERILE IRR 3000ML UROMA (IV SOLUTION) ×2 IMPLANT

## 2023-03-03 NOTE — Anesthesia Procedure Notes (Signed)
Procedure Name: Intubation Date/Time: 03/03/2023 7:45 AM  Performed by: Sindy Guadeloupe, CRNAPre-anesthesia Checklist: Patient identified, Emergency Drugs available, Suction available, Patient being monitored and Timeout performed Patient Re-evaluated:Patient Re-evaluated prior to induction Oxygen Delivery Method: Circle system utilized Preoxygenation: Pre-oxygenation with 100% oxygen Induction Type: IV induction Ventilation: Mask ventilation without difficulty Laryngoscope Size: Mac and 4 Grade View: Grade I Tube type: Oral Tube size: 7.5 mm Number of attempts: 1 Airway Equipment and Method: Stylet Placement Confirmation: ETT inserted through vocal cords under direct vision, positive ETCO2 and breath sounds checked- equal and bilateral Secured at: 22 cm Tube secured with: Tape Dental Injury: Teeth and Oropharynx as per pre-operative assessment

## 2023-03-03 NOTE — Progress Notes (Signed)
   03/03/23 2300  BiPAP/CPAP/SIPAP  Reason BIPAP/CPAP not in use Non-compliant   Pt. Wants to remain on 2 lpm n/c that has been on since surgery this date.

## 2023-03-03 NOTE — Anesthesia Postprocedure Evaluation (Signed)
Anesthesia Post Note  Patient: [name]  Procedure(s) Performed: CYSTOSCOPY WITH INDOCYANINE INJECTION XI ROBOTIC ASSISTED LAPAROSCOPIC COMPLETE CYSTECTECTOMY WITH  ILEAL CONDUIT DIVERSION XI ROBOTIC ASSISTED LAPAROSCOPIC RADICAL PROSTATECTOMY WITH LYMPH NODE DISSECTION HERNIA REPAIR UMBILICAL     Patient location during evaluation: PACU Anesthesia Type: General Level of consciousness: awake Pain management: pain level controlled Vital Signs Assessment: post-procedure vital signs reviewed and stable Respiratory status: spontaneous breathing, nonlabored ventilation and respiratory function stable Cardiovascular status: blood pressure returned to baseline and stable Postop Assessment: no apparent nausea or vomiting Anesthetic complications: no   No notable events documented.  Last Vitals:  Vitals:   03/03/23 1515 03/03/23 1530  BP: (!) 161/66   Pulse: 70   Resp: 14 12  Temp:    SpO2: 93%     Last Pain:  Vitals:   03/03/23 1530  TempSrc:   PainSc: 5                  Linton Rump

## 2023-03-03 NOTE — Anesthesia Procedure Notes (Signed)
Arterial Line Insertion Start/End11/11/2022 7:10 AM, 03/03/2023 7:13 AM Performed by: Linton Rump, MD, anesthesiologist  Patient location: Pre-op. Preanesthetic checklist: patient identified, IV checked, site marked, risks and benefits discussed, surgical consent, monitors and equipment checked, pre-op evaluation, timeout performed and anesthesia consent Lidocaine 1% used for infiltration Right, radial was placed Catheter size: 20 G Hand hygiene performed  and maximum sterile barriers used   Attempts: 1 Procedure performed using ultrasound guided (No image saved.) technique. Following insertion, dressing applied and Biopatch. Post procedure assessment: normal and unchanged  Patient tolerated the procedure well with no immediate complications.

## 2023-03-03 NOTE — Transfer of Care (Signed)
Immediate Anesthesia Transfer of Care Note  Patient: Luis [name]  Procedure(s) Performed: CYSTOSCOPY WITH INDOCYANINE INJECTION XI ROBOTIC ASSISTED LAPAROSCOPIC COMPLETE CYSTECTECTOMY WITH  ILEAL CONDUIT DIVERSION XI ROBOTIC ASSISTED LAPAROSCOPIC RADICAL PROSTATECTOMY WITH LYMPH NODE DISSECTION HERNIA REPAIR UMBILICAL  Patient Location: PACU  Anesthesia Type:General  Level of Consciousness: awake, alert , and patient cooperative  Airway & Oxygen Therapy: Patient Spontanous Breathing and Patient connected to face mask oxygen  Post-op Assessment: Report given to RN and Post -op Vital signs reviewed and stable  Post vital signs: Reviewed and stable  Last Vitals:  Vitals Value Taken Time  BP 181/84 03/03/23 1352  Temp    Pulse 66 03/03/23 1356  Resp 11 03/03/23 1356  SpO2 99 % 03/03/23 1356  Vitals shown include unfiled device data.  Last Pain:  Vitals:   03/03/23 0648  TempSrc:   PainSc: 0-No pain         Complications: No notable events documented.

## 2023-03-03 NOTE — Plan of Care (Signed)

## 2023-03-03 NOTE — Brief Op Note (Signed)
03/03/2023  1:41 PM  PATIENT:  Luis [name]  67 y.o. male  PRE-OPERATIVE DIAGNOSIS:  PROGRESSIVE BLADDER CANCER  POST-OPERATIVE DIAGNOSIS:  PROGRESSIVE BLADDER CANCER  PROCEDURE:  Procedure(s) with comments: CYSTOSCOPY WITH INDOCYANINE INJECTION (N/A) - 360 MINUTES XI ROBOTIC ASSISTED LAPAROSCOPIC COMPLETE CYSTECTECTOMY WITH  ILEAL CONDUIT DIVERSION (N/A) XI ROBOTIC ASSISTED LAPAROSCOPIC RADICAL PROSTATECTOMY WITH LYMPH NODE DISSECTION (N/A) HERNIA REPAIR UMBILICAL  SURGEON:  Surgeons and Role:    * Qamar Aughenbaugh, Delbert Phenix., MD - Primary  PHYSICIAN ASSISTANT:   ASSISTANTS: Harrie Foreman PA   ANESTHESIA:   local and general  EBL:  800 mL   BLOOD ADMINISTERED:none  DRAINS:  1 - JP to bulb; 2 - RLQ Urostomy to gravity with Rt (blued) and Lt (red) bander stents    LOCAL MEDICATIONS USED:  MARCAINE     SPECIMEN:  Source of Specimen:  1 - ureteral margins, 2 - pelvic lymph nodes; 3 - bladder + prostate en bloc  DISPOSITION OF SPECIMEN:  PATHOLOGY  COUNTS:  YES  TOURNIQUET:  * No tourniquets in log *  DICTATION: .Other Dictation: Dictation Number [id number]  [id number]  PLAN OF CARE: Admit to inpatient   PATIENT DISPOSITION:  PACU - hemodynamically stable.   Delay start of Pharmacological VTE agent (>24hrs) due to surgical blood loss or risk of bleeding: yes

## 2023-03-03 NOTE — Plan of Care (Signed)
  Problem: Education: Goal: Knowledge of General Education information will improve Description: Including pain rating scale, medication(s)/side effects and non-pharmacologic comfort measures Outcome: Progressing   Problem: Clinical Measurements: Goal: Ability to maintain clinical measurements within normal limits will improve Outcome: Progressing   Problem: Coping: Goal: Level of anxiety will decrease Outcome: Progressing   Problem: Pain Management: Goal: General experience of comfort will improve Outcome: Progressing

## 2023-03-04 ENCOUNTER — Encounter (HOSPITAL_COMMUNITY): Payer: Self-pay | Admitting: Urology

## 2023-03-04 LAB — BASIC METABOLIC PANEL
Anion gap: 8 (ref 5–15)
BUN: 14 mg/dL (ref 8–23)
CO2: 22 mmol/L (ref 22–32)
Calcium: 7.7 mg/dL — ABNORMAL LOW (ref 8.9–10.3)
Chloride: 106 mmol/L (ref 98–111)
Creatinine, Ser: 0.99 mg/dL (ref 0.61–1.24)
GFR, Estimated: >60 mL/min (ref >60)
Glucose, Bld: 127 mg/dL — ABNORMAL HIGH (ref 70–99)
Potassium: 4.4 mmol/L (ref 3.5–5.1)
Sodium: 136 mmol/L (ref 135–145)

## 2023-03-04 LAB — HEMOGLOBIN AND HEMATOCRIT, BLOOD
HCT: 35.6 % — ABNORMAL LOW (ref 39.0–52.0)
Hemoglobin: 11.1 g/dL — ABNORMAL LOW (ref 13.0–17.0)

## 2023-03-04 NOTE — Op Note (Unsigned)
NAME: Luis [name], CAPELLA MEDICAL RECORD NO: [id number] ACCOUNT NO: [id number] DATE OF BIRTH: [birth year] FACILITY: [facility] LOCATION: [id number] PHYSICIAN: Sebastian Ache, MD  Operative Report   PREOPERATIVE DIAGNOSIS:  Refractory high-grade bladder cancer.  PROCEDURE PERFORMED: 1.  Cystoscopy with injection of ICG dye. 2.  Laparoscopic radical cystectomy with prostatectomy, bilateral pelvic lymph node dissection, ileal conduit urinary diversion. 3.  Open umbilical hernia repair.  ESTIMATED BLOOD LOSS:  800 mL  COMPLICATIONS:  None.  SPECIMENS: 1. Left ureteral margin, frozen section. 2. Left final distal ureteral margin. 3. Right distal ureteral margin, frozen section. 4. Final right distal ureteral margin. 5. Right external iliac lymph nodes. 6. Right common iliac lymph nodes. 7. Left external iliac lymph nodes. 8. Left common iliac lymph nodes. 9. Bladder plus prostate en bloc.  ASSISTANT:  Flo Shanks, PA  DRAINS:  1.  Jackson-Pratt drain to bulb suction. 2.  Right lower quadrant urostomy to gravity drainage with the right (blue) and left (red) Bander stents to gravity drainage.  INDICATIONS:  The patient is a pleasant but comorbid 67 year old man with a history of ischemic cardiovascular disease, congestive heart failure, and fairly large obesity. He has had a multi-year course of high-grade bladder cancer. He is status post  several transurethral resections and BCG induction x2. He is even status post systemic immunotherapy with pembrolizumab. Despite these heroic effortsat bladder preservation, he continues to have recurrent high-grade disease that is not  endoscopically manageable partially due to his rapid recurrence and due to his body habitus where tumor at the dome is inaccessible. Options and risks for further management including continued systemic therapy with palliative intent versus perineal  urethrostomy to hopefully better resect his difficult to reach tumor versus  curative intent cystectomy. We discussed these options for years, meaning he has had very good functional status. Most recent cardiovascular evaluation is quite reassuring. We have  all agreed on curative intent cystectomy.  He was admitted yesterday for bowel prep labs in preparation for surgery today, informed consent was then placed in my record.  DESCRIPTION OF PROCEDURE: The patient being verified himself, procedure being cystoscopy injection of ICG dye, robotic cystectomy, prostatectomy, pelvic lymph node dissection,  ileal conduit diversion was confirmed as well as umbilical hernia repair.   timeout was performed.  Intravenous antibiotics administered.  General endotracheal anesthesia induced.  The patient was placed into a low lithotomy position.  Sterile field was created, prepping and draping the patient's penis, perineum and proximal  thighs using iodine and his infra-xiphoid abdomen using chlorhexidine gluconate after clipper shaving. After he was further fastened to operative table using 3-inch tape over foam padding across the supraxiphoid chest, his arms were tucked to the side  with gel rolls.  A test of steep Trendelenburg positioning was performed and found to be suitably positioned.  An LAVH type drape was placed.  Cystourethroscopy was performed using a 24-French injection scope set.  Inspection of anterior and posterior  urethra revealed a large high-riding prostate with incredibly high bladder neck.  Inspection of the bladder revealed multifocal papillary tumor in all quadrants.  Given the limitations of geometry of his habitus, approximately 2 mL indocyanine green dye  was injected in the area of the bladder neck for sentinel lymph node angiography and then a silicone type catheter was placed free to straight drain. Next, high-flow, low-pressure pneumoperitoneum was obtained using Veress technique in the supraumbilical  midline approximately 4 cm superior to the superior edge of his  palpable hernia defect, which  was corroborated to contain only fat by axial imaging.  An 8 mm robotic camera port was then placed in location. Laparoscopic examination of the peritoneal  cavity revealed no significant adhesions and no visceral injury and simple-appearing fat within the umbilical hernia defect.  Additional ports were placed as follows:  Right paramedian 8 mm robotic port, right far lateral 12 mm AirSeal assistant port,  right paramedian 15 mm assistant port at the previously planned stomal site, left paramedian 8 mm robotic port, left far lateral 8 mm robotic port. Robot was docked and passed the electronic checks.  Attention was first directed at reduction of the  umbilical hernia. Simple-appearing omental fat was reduced from the umbilical hernia.  No evidence of bowel contents were noted.  Next, incision was made in the posterior peritoneal on the left side from the area of the presumed iliac vessels superiorly  for distance approximately 18 cm and then distally coursing lateral to the left medial umbilical ligament towards the anterior abdominal wall.  This created a large retroperitoneal flap which was retracted medially. The left ureter was encountered as it  coursed over the iliac vessels, marked a vessel loop, dissected proximally to the area of the gonadal crossing and then distally to the ureterovesical junction. It was doubly clipped and ligated proximal clip and dye tagged suture. Frozen section  negative for carcinoma.  This was tucked out of the true pelvis. This inherently exposed lymphatic fields on the left side and sentinel lymphangiography did reveal several avidly sentinel nodes within the left common iliac group.  Papillary tissue within  the confines of the left common iliac artery and vein were dissected free. Lymphostasis achieved with cold clips, set aside, labeled left common iliac nodes. Next, the left external iliac group was dissected free with the boundaries being  left external  iliac artery, vein, pelvic sidewall.  Lymphostasis achieved with cold clips, set aside, labeled left external iliac lymph nodes.  Given his excessive intraabdominal obesity and very large bladder, the visualization and angulation towards the obturator  group was not great and this was not dissected.  Left obturator nerve was visualized. There were no sentinel lymph nodes in that aspect.  Dissection proceeded lateral to the left bladder wall towards the area of the endopelvic fascia on the left side  down to the prostatic apex. This completed left-sided retroperitoneal dissection.  Next, attention was turned to right-sided retroperitoneal dissection.  The ileocecal junction was identified as verified by the location of the appendix. This was traced  proximally a distance of approximately 18 cm to a loop of distal ileum that appeared to have suitable vascularity and mobility for conduit formation. This was marked with silk tag clip and a separate clip distal to this for proximal distal orientation.   An incision was made lateral to the ascending colon from the area of the cecum proximally for a distal of about 18 cm and then distally coursing lateral to the right medial umbilical ligament towards the area of the anterior abdominal wall and a Y-shaped  extension was made of this coursing along the iliac vessels towards the area of the aortic bifurcation.  This created a large right-sided retroperitoneal flap, which was retracted medially.  The right ureter was encountered as it coursed over the iliac  vessels dissected proximally to the gonadal crossing distal to the ureterovesical junction, which was doubly clipped and ligated, proximal clip being a white tagged suture.  The frozen section was somewhat suspicious for carcinoma  in situ.  This was  grossly negative.  Right ureter was tucked out of the true pelvis.  Next, right-sided pelvic lymphadenectomy was performed of the right common iliac  and right external iliac groups as per the left side and similarly the right obturator nerve was  visualized. The right bladder wall was swept away from the pelvic side wall towards the area of the endopelvic fascia on the right side and then towards the area of the prostatic apex.  A retroperitoneal tunnel was then made from the right side to the  left side directly anterior to the ureter bifurcation. The left ureter was grasped through this, brought through this tunnel to the right side and the right ureter, left ureter, terminal ileum tag sutures were placed into a single Hem-o-Lok clip and all  tucked out of the true pelvis. Posterior dissection was performed by connecting the previous inferior aspects of the retroperitoneal incisions in the midline behind the bladder and this retroperitoneal flap was developed behind the plane of the vas and  seminal vesicles toward the area of the apex of the prostate and then swept laterally, which exposed the vascular pedicles of the bladder and prostate. This was controlled using sequential stapling technique with vascular stapler x2 each side taking  exquisite care to avoid vascular, neurologic or rectal injury which did not occur.  I was quite happy with the completeness and safety of this. The space of Retzius was then developed between the medial umbilical ligaments down to the level of the dorsal  venous complex and was controlled using green load stapler. Final apical dissection was performed in the anterior plane. The membranous urethra was transected coldly and the in situ silicone catheter was doubly clipped, ligated used as bucket handle  allowing repositioning of the bladder and prostate en bloc specimen into an extra large EndoCatch bag.  The membranous urethral stump was oversewn using running V-Loc suture.  Several small venous sinuses in this area were oversewn also using V-Loc  suture.  There was excellent hemostasis.  Notably, the patient just had  with overall somewhat oozy up to this portion with a total blood loss of approximately 800 mL, but at this point, hemostasis was quite good.  Closed suction drain brought out  through the previous left lateral most robotic port site into the peritoneal cavity. The specimen in the specimen bag were brought to the left side of the abdomen with a bag string coming through the left paramedian robotic port site and the right  ureter, left ureter, terminal ileum tag sutures were grasped with a self-locking grasper via the right-sided paramedian 15 mm port site.  Robot was then undocked.  Specimen was retrieved by extending the previous camera port site inferiorly erring to the  left side of the umbilicus directly into and through the hernia defect at its inferior border and the bladder plus prostate en bloc specimen was removed, setting aside for permanent pathology.  An Alexis retractor was placed through this. The right  ureter, left ureter, terminal ileum tag sutures were brought through this.  These structures were grasped with Babcock forceps and the bilateral ureters appeared to have sufficient length and viability for conduit formation. The previously marked  area of distal ileum was then taken out of continuity for a distance approximately 14 cm using green load stapler proximal and distal.  The mesentery was developed using 1.5 loads of white load stapler distal, 1 load proximal, taking exquisite care to  avoid devascularization  of the anastomotic or conduit segments. The conduit segment was then aligned to retroperitoneal orientation.  Bowel-bowel anastomosis was performed on the antimesenteric border using 1.5 loads of the green load stapler with the  free end being oversewn with running silk and second imbricating layer of running silk.  The acute angle to the anastomosis was bolstered using interrupted silk and the mesenteric defect was reapproximated using interrupted silk.  The bowel-bowel   anastomosis was visibly viable and palpably patent and was redelivered into the abdominal cavity. Attention was directed at the formation of the conduit.  The proximal staple line was excluded using running Vicryl and the distal staple line was removed.   A position suitable for ureteroenteric anastomosis was performed.  It was identified and a 4 mm segment of bowel serosal mucosa was excised and 4 mucosal everting sutures placed. Notably, this was on the mesenteric side of the proximal conduit. A heel  stitch was applied to the spatulated left ureter. Final margin sent.  Red color Bander stent was placed to 26 cm anastomosis, which was then continued using two separate running suture layers of 4-0 Vicryl which revealed excellent tension free  apposition. A chromic suture was used to anchor the stent in the midpoint to prevent inadvertent early displacement.  Next, the right ureter was similarly trimmed to length as proximally as geometry would allow given the question of in situ disease at  this location on frozen section. Notably, this is grossly completely unremarkable with no evidence of papillary tumor or erythematous changes whatsoever. The right ureter was spatulated and similarly a 4-mm segment of bowel serosal mucosa was excised and  mucosal everting sutures placed.  Heel suture was applied and a blue-colored Bander stent was placed to 25 cm anastomosis, which was then completed using two separate running suture layers of 4-0 Vicryl and the stent also anchored in the midpoint using  interrupted silk.  I was very, very happy with the geometry and vascularity of the conduit segment. Next, a quarter-sized diameter column of skin and subcutaneous tissue was excised from the previously marked stomal corresponding to the 15 mm port site  to level of the fascia was dilated to accommodate two surgeon's fingers.  Four quadrant type Vicryl sutures were placed for fascial anchoring. The conduit was brought  through this and the fascial anchoring sutures fashioned to the proximal end of this to  prevent peristomal hernia formation and then also used the rosebudding sutures.  The abdomen was again inspected. Hemostasis was excellent.  Sponge, needle counts were correct. Omentum was brought over the traction port site.  Hernia sac was removed and  the traction site was closed using figure-of-eight NovaFil permanent suture x11, which provided excellent reapproximation of the prior umbilical hernia defect as well. Scarpa's was reapproximated using running Vicryl.  All incision sites were  infiltrated dilute lipolyzed Marcaine and closed at the level of the skin using subcuticular Monocryl followed by Dermabond.  Final stomal maturation was performed in a quadrant-type fashion using additional 3-0 Vicryl x3 each quadrant. Stomal appliance  was placed. Stents were cut to length and procedure was terminated. The patient tolerated the procedure well with no immediate perioperative complications. The patient taken to Postanesthesia Care Unit in stable condition. .  Please note first assistant, Flo Shanks, was crucial for all portions of the surgery today. She provided invaluable retraction, suctioning, vascular stapling, vascular clipping, lymphatic clipping, specimen manipulation, robotic instrument exchange  and general first assistance.  PAA D: 03/03/2023 1:58:53 pm T: 03/04/2023 12:03:00 am  JOB: [id number]/ [id number]

## 2023-03-04 NOTE — Progress Notes (Signed)
   03/04/23 2018  BiPAP/CPAP/SIPAP  BiPAP/CPAP/SIPAP Pt Type Adult (prefers self placement)  BiPAP/CPAP/SIPAP DREAMSTATIOND  Mask Type Full face mask (from home)  FiO2 (%) 21 %  Patient Home Equipment No (mask and tubing from home)  Auto Titrate Yes  CPAP/SIPAP surface wiped down Yes  BiPAP/CPAP /SiPAP Vitals  Pulse Rate 71  Resp 18  SpO2 94 %  Bilateral Breath Sounds Clear;Diminished  MEWS Score/Color  MEWS Score 0  MEWS Score Color Green

## 2023-03-04 NOTE — Progress Notes (Signed)
1 Day Post-Op Subjective: Pain well controlled on current pain control regiment. Negative flatus. No nausea/vomiting. Mild incisional pain. Patient ambulated in room  Objective: Vital signs in last 24 hours: Temp:  [97.7 F (36.5 C)-98.6 F (37 C)] 97.7 F (36.5 C) (11/09 0742) Pulse Rate:  [61-80] 64 (11/09 0742) Resp:  [0-25] 16 (11/09 0742) BP: (124-192)/(58-124) 124/58 (11/09 0742) SpO2:  [90 %-100 %] 95 % (11/09 0742) Arterial Line BP: (191-237)/(58-120) 237/120 (11/08 1430)  Intake/Output from previous day: 11/08 0701 - 11/09 0700 In: 3724.7 [I.V.:2913; IV Piggyback:811.7] Out: 2990 [Urine:1625; Drains:565; Blood:800] Intake/Output this shift: Total I/O In: -  Out: 255 [Urine:225; Drains:30]  Physical Exam:  General:alert, cooperative, and appears stated age GI: soft, non tender, normal bowel sounds, no palpable masses, no organomegaly, no inguinal hernia Male genitalia: not done Extremities: extremities normal, atraumatic, no cyanosis or edema  Lab Results: Recent Labs    03/03/23 1144 03/03/23 1438 03/04/23 0538  HGB 10.9* 11.3* 11.1*  HCT 32.0* 36.1* 35.6*   BMET Recent Labs    03/02/23 1921 03/03/23 1144 03/04/23 0538  NA 134* 135 136  K 3.9 4.4 4.4  CL 100  --  106  CO2 26  --  22  GLUCOSE 109*  --  127*  BUN 19  --  14  CREATININE 0.95  --  0.99  CALCIUM 8.5*  --  7.7*   No results for input(s): "LABPT", "INR" in the last 72 hours. No results for input(s): "LABURIN" in the last 72 hours. Results for orders placed or performed during the hospital encounter of 03/02/23  Surgical PCR screen     Status: None   Collection Time: 03/02/23  4:44 PM   Specimen: Nasal Mucosa; Nasal Swab  Result Value Ref Range Status   MRSA, PCR NEGATIVE NEGATIVE Final   Staphylococcus aureus NEGATIVE NEGATIVE Final    Comment: (NOTE) The Xpert SA Assay (FDA approved for NASAL specimens in patients 51 years of age and older), is one component of a  comprehensive surveillance program. It is not intended to diagnose infection nor to guide or monitor treatment. Performed at [facility], [address] [place], [place] [address]     Studies/Results: No results found.  Assessment/Plan: POD#1 radical cystectomy NPO with Ice chips Continue current pain control regiment PT consult    LOS: 2 days   Wilkie Aye 03/04/2023, 11:36 AM

## 2023-03-04 NOTE — Progress Notes (Signed)
Mobility Specialist - Progress Note   03/04/23 0915  Mobility  Activity Ambulated with assistance in hallway  Level of Assistance Modified independent, requires aide device or extra time  Assistive Device Front wheel walker  Distance Ambulated (ft) 440 ft  Range of Motion/Exercises Active  Activity Response Tolerated well  Mobility Referral Yes  $Mobility charge 1 Mobility  Mobility Specialist Start Time (ACUTE ONLY) 0850  Mobility Specialist Stop Time (ACUTE ONLY) 0915  Mobility Specialist Time Calculation (min) (ACUTE ONLY) 25 min   Pt was found on recliner chair and agreeable to ambulate. Pt initially ambulating in rrom ~58ft then proceeded to hallway ambulation. C/o back pain throughout. At EOS returned to chair with all needs met. Call bell in reach and wife in room.  Billey Chang Mobility Specialist

## 2023-03-05 LAB — BASIC METABOLIC PANEL
Anion gap: 8 (ref 5–15)
BUN: 15 mg/dL (ref 8–23)
CO2: 23 mmol/L (ref 22–32)
Calcium: 8.1 mg/dL — ABNORMAL LOW (ref 8.9–10.3)
Chloride: 106 mmol/L (ref 98–111)
Creatinine, Ser: 0.96 mg/dL (ref 0.61–1.24)
GFR, Estimated: >60 mL/min (ref >60)
Glucose, Bld: 123 mg/dL — ABNORMAL HIGH (ref 70–99)
Potassium: 4 mmol/L (ref 3.5–5.1)
Sodium: 137 mmol/L (ref 135–145)

## 2023-03-05 LAB — HEMOGLOBIN AND HEMATOCRIT, BLOOD
HCT: 35.8 % — ABNORMAL LOW (ref 39.0–52.0)
Hemoglobin: 11.2 g/dL — ABNORMAL LOW (ref 13.0–17.0)

## 2023-03-05 MED ORDER — FUROSEMIDE 40 MG PO TABS
40.0000 mg | ORAL_TABLET | Freq: Two times a day (BID) | ORAL | Status: DC
  Administered 2023-03-05 – 2023-03-09 (×8): 40 mg via ORAL
  Filled 2023-03-05 (×8): qty 1

## 2023-03-05 NOTE — Plan of Care (Signed)
  Problem: Education: Goal: Knowledge of General Education information will improve Description: Including pain rating scale, medication(s)/side effects and non-pharmacologic comfort measures Outcome: Progressing   Problem: Clinical Measurements: Goal: Will remain free from infection Outcome: Progressing   Problem: Pain Management: Goal: General experience of comfort will improve Outcome: Progressing   Problem: Safety: Goal: Ability to remain free from injury will improve Outcome: Progressing

## 2023-03-05 NOTE — Consult Note (Signed)
WOC consulted for new urostomy, will follow up in the am for post op needs  Luis [name] [facility], CNS, [employer] [phone]

## 2023-03-05 NOTE — Progress Notes (Signed)
2 Days Post-Op Subjective: Pain well controlled on current pain control regiment. Negative flatus. No nausea/vomiting. Mild incisional pain. Tolerating ice chips  Objective: Vital signs in last 24 hours: Temp:  [98.1 F (36.7 C)-99.1 F (37.3 C)] 99 F (37.2 C) (11/10 1302) Pulse Rate:  [71-80] 80 (11/10 1302) Resp:  [16-20] 16 (11/10 1302) BP: (119-153)/(56-83) 132/77 (11/10 1302) SpO2:  [92 %-100 %] 93 % (11/10 1302) FiO2 (%):  [21 %] 21 % (11/09 2018)  Intake/Output from previous day: 11/09 0701 - 11/10 0700 In: 1278.4 [P.O.:390; I.V.:600.1; IV Piggyback:288.3] Out: 2175 [Urine:1720; Drains:455] Intake/Output this shift: Total I/O In: 220 [P.O.:220] Out: 735 [Urine:400; Drains:335]  Physical Exam:  General:alert, cooperative, and appears stated age GI: soft, non tender, normal bowel sounds, no palpable masses, no organomegaly, no inguinal hernia Male genitalia: not done Extremities: extremities normal, atraumatic, no cyanosis or edema  Lab Results: Recent Labs    03/03/23 1438 03/04/23 0538 03/05/23 0529  HGB 11.3* 11.1* 11.2*  HCT 36.1* 35.6* 35.8*   BMET Recent Labs    03/04/23 0538 03/05/23 0529  NA 136 137  K 4.4 4.0  CL 106 106  CO2 22 23  GLUCOSE 127* 123*  BUN 14 15  CREATININE 0.99 0.96  CALCIUM 7.7* 8.1*   No results for input(s): "LABPT", "INR" in the last 72 hours. No results for input(s): "LABURIN" in the last 72 hours. Results for orders placed or performed during the hospital encounter of 03/02/23  Surgical PCR screen     Status: None   Collection Time: 03/02/23  4:44 PM   Specimen: Nasal Mucosa; Nasal Swab  Result Value Ref Range Status   MRSA, PCR NEGATIVE NEGATIVE Final   Staphylococcus aureus NEGATIVE NEGATIVE Final    Comment: (NOTE) The Xpert SA Assay (FDA approved for NASAL specimens in patients 62 years of age and older), is one component of a comprehensive surveillance program. It is not intended to diagnose infection nor  to guide or monitor treatment. Performed at [facility], [address], [place], [place] [address]     Studies/Results: No results found.  Assessment/Plan: POD#2 radical cystectomy Clear liquid diet Continue current pain control regiment Ambulate in halls with assistance    LOS: 3 days   Wilkie Aye 03/05/2023, 4:14 PM

## 2023-03-05 NOTE — Evaluation (Signed)
Physical Therapy One Time Evaluation and Discharge from acute PT Patient Details Name: Luis [name] MRN: [id number] DOB: 1956/02/23 Today's Date: 03/05/2023  History of Present Illness  Pt is a 67 year old male s/p Laparoscopic radical cystectomy with prostatectomy, bilateral pelvic lymph node dissection, ileal conduit urinary diversion.  Clinical Impression  Patient evaluated by Physical Therapy with no further acute PT needs identified. All education has been completed and the patient has no further questions.   Pt ambulated in hallway with RW.  Pt performing well and plans to walk 4x today.  Pt agreeable to no further PT needs however did wish to see if he could have a rollator upon d/c.  PT is signing off. Thank you for this referral.          If plan is discharge home, recommend the following:     Can travel by private vehicle        Equipment Recommendations Rollator (4 wheels)  Recommendations for Other Services       Functional Status Assessment Patient has not had a recent decline in their functional status     Precautions / Restrictions Precautions Precautions: Fall Precaution Comments: urostomy, JP drain Restrictions Weight Bearing Restrictions: No      Mobility  Bed Mobility               General bed mobility comments: pt in recliner    Transfers Overall transfer level: Needs assistance Equipment used: Rolling walker (2 wheels) Transfers: Sit to/from Stand Sit to Stand: Supervision           General transfer comment: cues for hand placement    Ambulation/Gait Ambulation/Gait assistance: Supervision Gait Distance (Feet): 400 Feet Assistive device: Rolling walker (2 wheels) Gait Pattern/deviations: Step-through pattern, Decreased stride length       General Gait Details: pt mobilizing well with RW, no unsteadiness or LOB  Stairs            Wheelchair Mobility     Tilt Bed    Modified Rankin (Stroke Patients Only)        Balance Overall balance assessment: No apparent balance deficits (not formally assessed) (denies falls)                                           Pertinent Vitals/Pain Pain Assessment Pain Assessment: No/denies pain    Home Living Family/patient expects to be discharged to:: Private residence Living Arrangements: Spouse/significant other   Type of Home: House Home Access: Stairs to enter   Secretary/administrator of Steps: 2   Home Layout: One level Home Equipment: Agricultural consultant (2 wheels)      Prior Function Prior Level of Function : Independent/Modified Independent                     Extremity/Trunk Assessment        Lower Extremity Assessment Lower Extremity Assessment: Overall WFL for tasks assessed    Cervical / Trunk Assessment Cervical / Trunk Assessment: Normal  Communication   Communication Communication: No apparent difficulties  Cognition Arousal: Alert Behavior During Therapy: WFL for tasks assessed/performed Overall Cognitive Status: Within Functional Limits for tasks assessed  General Comments      Exercises     Assessment/Plan    PT Assessment Patient does not need any further PT services  PT Problem List         PT Treatment Interventions      PT Goals (Current goals can be found in the Care Plan section)  Acute Rehab PT Goals PT Goal Formulation: All assessment and education complete, DC therapy    Frequency       Co-evaluation               AM-PAC PT "6 Clicks" Mobility  Outcome Measure Help needed turning from your back to your side while in a flat bed without using bedrails?: None Help needed moving from lying on your back to sitting on the side of a flat bed without using bedrails?: None Help needed moving to and from a bed to a chair (including a wheelchair)?: None Help needed standing up from a chair using your arms (e.g.,  wheelchair or bedside chair)?: None Help needed to walk in hospital room?: None Help needed climbing 3-5 steps with a railing? : A Little 6 Click Score: 23    End of Session Equipment Utilized During Treatment: Gait belt Activity Tolerance: Patient tolerated treatment well Patient left: in chair;with call bell/phone within reach;with family/visitor present Nurse Communication: Mobility status PT Visit Diagnosis: Difficulty in walking, not elsewhere classified (R26.2)    Time: 1610-9604 PT Time Calculation (min) (ACUTE ONLY): 14 min   Charges:   PT Evaluation $PT Eval Low Complexity: 1 Low   PT General Charges $$ ACUTE PT VISIT: 1 Visit       Thomasene Mohair PT, DPT Physical Therapist Acute Rehabilitation Services Office: [phone]   Kati L Payson 03/05/2023, 1:23 PM

## 2023-03-06 ENCOUNTER — Encounter (HOSPITAL_COMMUNITY): Payer: PPO

## 2023-03-06 LAB — BASIC METABOLIC PANEL
Anion gap: 11 (ref 5–15)
BUN: 17 mg/dL (ref 8–23)
CO2: 20 mmol/L — ABNORMAL LOW (ref 22–32)
Calcium: 8.3 mg/dL — ABNORMAL LOW (ref 8.9–10.3)
Chloride: 105 mmol/L (ref 98–111)
Creatinine, Ser: 0.98 mg/dL (ref 0.61–1.24)
GFR, Estimated: >60 mL/min (ref >60)
Glucose, Bld: 135 mg/dL — ABNORMAL HIGH (ref 70–99)
Potassium: 4.3 mmol/L (ref 3.5–5.1)
Sodium: 136 mmol/L (ref 135–145)

## 2023-03-06 LAB — CREATININE, FLUID (PLEURAL, PERITONEAL, JP DRAINAGE): Creat, Fluid: 0.9 mg/dL

## 2023-03-06 LAB — HEMOGLOBIN AND HEMATOCRIT, BLOOD
HCT: 37.1 % — ABNORMAL LOW (ref 39.0–52.0)
Hemoglobin: 11.4 g/dL — ABNORMAL LOW (ref 13.0–17.0)

## 2023-03-06 MED ORDER — ASPIRIN 81 MG PO TBEC
81.0000 mg | DELAYED_RELEASE_TABLET | Freq: Every morning | ORAL | Status: DC
  Administered 2023-03-06 – 2023-03-09 (×4): 81 mg via ORAL
  Filled 2023-03-06 (×4): qty 1

## 2023-03-06 MED ORDER — METOCLOPRAMIDE HCL 5 MG/ML IJ SOLN
10.0000 mg | Freq: Three times a day (TID) | INTRAMUSCULAR | Status: DC
  Administered 2023-03-07 – 2023-03-09 (×8): 10 mg via INTRAVENOUS
  Filled 2023-03-06 (×9): qty 2

## 2023-03-06 MED ORDER — METOCLOPRAMIDE HCL 5 MG/ML IJ SOLN
10.0000 mg | Freq: Three times a day (TID) | INTRAMUSCULAR | Status: DC
  Administered 2023-03-06: 10 mg via INTRAVENOUS
  Filled 2023-03-06: qty 2

## 2023-03-06 MED ORDER — ACETAMINOPHEN 500 MG PO TABS
1000.0000 mg | ORAL_TABLET | Freq: Four times a day (QID) | ORAL | Status: DC
  Administered 2023-03-06 – 2023-03-09 (×12): 1000 mg via ORAL
  Filled 2023-03-06 (×12): qty 2

## 2023-03-06 MED ORDER — MELATONIN 3 MG PO TABS
3.0000 mg | ORAL_TABLET | Freq: Every day | ORAL | Status: DC
  Administered 2023-03-06 – 2023-03-08 (×3): 3 mg via ORAL
  Filled 2023-03-06 (×3): qty 1

## 2023-03-06 MED ORDER — HEPARIN SODIUM (PORCINE) 5000 UNIT/ML IJ SOLN
5000.0000 [IU] | Freq: Three times a day (TID) | INTRAMUSCULAR | Status: DC
  Administered 2023-03-06 – 2023-03-09 (×10): 5000 [IU] via SUBCUTANEOUS
  Filled 2023-03-06 (×10): qty 1

## 2023-03-06 NOTE — Consult Note (Signed)
WOC Nurse ostomy consult note Stoma type/location: RLQ, ileal conduit  Stomal assessment/size: 1 5/8" bloody, moist, 2 stents in place 1 red/1 blue Peristomal assessment: intact  Treatment options for stomal/peristomal skin: 2" ostomy barrier ring  Output: pink clear urine Ostomy pouching: 1pc.flat urostomy pouch with 2" barrier ring  Education provided:  Explained role of ostomy nurse and creation of stoma  Explained stoma characteristics (budded, flush, color, texture, care) Demonstrated pouch change (cutting new barrier, measuring stoma, cleaning peristomal skin and stoma, use of barrier ring) Education on use wick in stoma to keep skin dry with pouch change Education on emptying when 1/3 to 1/2 full and how to empty Education on urine characteristics (sediment, mucous) Demonstrated hooking pouch to nighttime drainage bag Provided patient with [employer] and marked items currently using Answered patient/family questions:    Enrolled patient in [employer] DC program: Yes   WOC Nurse will follow along with you for continued support with ostomy teaching and care Briseyda Fehr [place] MSN, RN, [place], CNS, [place] [phone]

## 2023-03-06 NOTE — Progress Notes (Addendum)
3 Days Post-Op Subjective: Pt remains HDS Pain controlled, but only taking IV Dilaudid Tolerating CLD, PG but also burping UOP dropping from 1.6L to 800 cc over past 24 hours Moderate uptick in drain output, 455 cc --> 800 cc  H/H stable, BMP in process   Objective: Vital signs in last 24 hours: Temp:  [98 F (36.7 C)-99 F (37.2 C)] 98.7 F (37.1 C) (11/11 0509) Pulse Rate:  [71-80] 71 (11/11 0509) Resp:  [16-24] 21 (11/11 0509) BP: (131-147)/(72-86) 132/72 (11/11 0509) SpO2:  [91 %-100 %] 91 % (11/11 0509)  Intake/Output from previous day: 11/10 0701 - 11/11 0700 In: 800 [P.O.:800] Out: 1755 [Urine:949; Drains:806] Intake/Output this shift: No intake/output data recorded.  Physical Exam:  General:alert, cooperative, and appears stated age GI: soft, non tender, normal bowel sounds, no palpable masses, no organomegaly, no inguinal hernia Male genitalia: not done Extremities: extremities normal, atraumatic, no cyanosis or edema  Lab Results: Recent Labs    03/04/23 0538 03/05/23 0529 03/06/23 0502  HGB 11.1* 11.2* 11.4*  HCT 35.6* 35.8* 37.1*   BMET Recent Labs    03/04/23 0538 03/05/23 0529  NA 136 137  K 4.4 4.0  CL 106 106  CO2 22 23  GLUCOSE 127* 123*  BUN 14 15  CREATININE 0.99 0.96  CALCIUM 7.7* 8.1*   No results for input(s): "LABPT", "INR" in the last 72 hours. No results for input(s): "LABURIN" in the last 72 hours. Results for orders placed or performed during the hospital encounter of 03/02/23  Surgical PCR screen     Status: None   Collection Time: 03/02/23  4:44 PM   Specimen: Nasal Mucosa; Nasal Swab  Result Value Ref Range Status   MRSA, PCR NEGATIVE NEGATIVE Final   Staphylococcus aureus NEGATIVE NEGATIVE Final    Comment: (NOTE) The Xpert SA Assay (FDA approved for NASAL specimens in patients 69 years of age and older), is one component of a comprehensive surveillance program. It is not intended to diagnose infection nor to guide or  monitor treatment. Performed at [facility], [address] [place], [place] [address]     Studies/Results: No results found.  Assessment/Plan: POD#3 radical cystectomy Adv diet to FLD DL discontinued, scheduled PO APAP Follow-up BMP WOCN to see today JP Creatinine this AM Will restart home asp 81 mg SQH today for DVT Ppx    LOS: 4 days   Fayrene Fearing Frisbie 03/06/2023, 7:07 AM   I have seen and examing the patient and agree with Dr. Shade Flood plan.  Briefly,  S:   1- Bladder Cancer - s/p robotic cystoprostatectomy + senitnal / template node dissection and conduit urinary diversion 03/03/23. Path pending. Admitted 11/7 for bowel prep. JP Cr same as serum 11/11. Proph ASA + heparin startedd POD 3.   2 - Ileus - bowel anastamosis as part of urinary diversion above .NPO initially post-op, ice chips POD1, Clears POD2, some flatus and advanced to full liquid diet POD 3. Starging scheduled reglan POD 3.   3 - Disposition / Rehab - lives with independently at baseline. PT eval 11/10 recs rolling walker.   Today "[name]" is progressing. Tolerating fulls w/o emesis. Not much BM or flatus today. DId stomal teahcign with ostomy RC today and wife which went well. Has been walking.   O: NAD, AOx3, family at bedside Non-labored breathing on RA RRR Stable large truncal obesity, no r/g RLQ Urostomy with somewhat dark but stable / viable with Rt (blue), Lt (red) bander  stents and non-foul urine. JP with non-foul serosanguinous output SCD's in place.   Hgb and Cr excellent Path penidng  A/P Doing well POD 3. Goals for DC discussed. Beign regland to encourage bowel motility. PM melatonitn per pt request for sleep (do not favor stronger sleep aids as can cause confusion).

## 2023-03-06 NOTE — Progress Notes (Signed)
   03/06/23 0003  BiPAP/CPAP/SIPAP  BiPAP/CPAP/SIPAP Pt Type Adult  BiPAP/CPAP/SIPAP DREAMSTATIOND  Mask Type Full face mask  Mask Size Large  Patient Home Equipment No  Auto Titrate Yes

## 2023-03-06 NOTE — Plan of Care (Signed)
  Problem: Pain Management: Goal: General experience of comfort will improve Outcome: Progressing   Problem: Safety: Goal: Ability to remain free from injury will improve Outcome: Progressing

## 2023-03-07 LAB — BASIC METABOLIC PANEL
Anion gap: 8 (ref 5–15)
BUN: 16 mg/dL (ref 8–23)
CO2: 25 mmol/L (ref 22–32)
Calcium: 8.1 mg/dL — ABNORMAL LOW (ref 8.9–10.3)
Chloride: 102 mmol/L (ref 98–111)
Creatinine, Ser: 0.93 mg/dL (ref 0.61–1.24)
GFR, Estimated: >60 mL/min (ref >60)
Glucose, Bld: 136 mg/dL — ABNORMAL HIGH (ref 70–99)
Potassium: 3.6 mmol/L (ref 3.5–5.1)
Sodium: 135 mmol/L (ref 135–145)

## 2023-03-07 LAB — HEMOGLOBIN AND HEMATOCRIT, BLOOD
HCT: 31.4 % — ABNORMAL LOW (ref 39.0–52.0)
Hemoglobin: 9.8 g/dL — ABNORMAL LOW (ref 13.0–17.0)

## 2023-03-07 MED ORDER — FUROSEMIDE 10 MG/ML IJ SOLN
20.0000 mg | Freq: Once | INTRAMUSCULAR | Status: AC
  Administered 2023-03-07: 20 mg via INTRAVENOUS
  Filled 2023-03-07: qty 2

## 2023-03-07 NOTE — Progress Notes (Signed)
   03/07/23 1942  BiPAP/CPAP/SIPAP  BiPAP/CPAP/SIPAP Pt Type Adult (pt declined assistance with cpap, prefers/comfortable with self-placement)  BiPAP/CPAP/SIPAP DREAMSTATIOND  Mask Type Full face mask (mask from home)  FiO2 (%) 21 %  Patient Home Equipment No (only his mask and tubing from home)  Auto Titrate Yes (pt comfortable with previous night's settings: automode, min8cm, max14cm h2o)  CPAP/SIPAP surface wiped down Yes

## 2023-03-07 NOTE — Progress Notes (Signed)
   03/07/23 0000  BiPAP/CPAP/SIPAP  BiPAP/CPAP/SIPAP Pt Type Adult  BiPAP/CPAP/SIPAP DREAMSTATIOND  Mask Type Nasal mask  FiO2 (%) 21 %  Patient Home Equipment No  Auto Titrate Yes

## 2023-03-07 NOTE — Progress Notes (Addendum)
4 Days Post-Op Subjective: Pt remains HDS, tmax 100.8 at 8 PM yesterday  Pain controlled, only requiring oral APAP UOP adequate, 825 cc, clear yellow Stoma unchanged Drain output remains high, 695 cc. JP creatinine yesterday negative Had BM this AM -- tolerating FLD Hemoglobin mild drop this AM, 11.4 --> 9.8  Objective: Vital signs in last 24 hours: Temp:  [98.5 F (36.9 C)-100.5 F (38.1 C)] 98.6 F (37 C) (11/12 1354) Pulse Rate:  [62-72] 70 (11/12 1354) Resp:  [16-20] 16 (11/12 1354) BP: (121-145)/(59-73) 131/69 (11/12 1354) SpO2:  [93 %-95 %] 93 % (11/12 1354) FiO2 (%):  [21 %] 21 % (11/12 0000)  Intake/Output from previous day: 11/11 0701 - 11/12 0700 In: 1680 [P.O.:1680] Out: 1970 [Urine:1175; Drains:795] Intake/Output this shift: Total I/O In: -  Out: 1350 [Urine:900; Drains:450]  Physical Exam:  General:alert, cooperative, and appears stated age GI: soft, non tender, normal bowel sounds, no palpable masses, no organomegaly, no inguinal hernia. Urostomy in place with bander stents. Stoma slightly congested but stable. Urine Yellow Male genitalia: not done Extremities: extremities normal, atraumatic, no cyanosis. 2+ edema BLE  Lab Results: Recent Labs    03/05/23 0529 03/06/23 0502 03/07/23 0504  HGB 11.2* 11.4* 9.8*  HCT 35.8* 37.1* 31.4*   BMET Recent Labs    03/06/23 0616 03/07/23 0504  NA 136 135  K 4.3 3.6  CL 105 102  CO2 20* 25  GLUCOSE 135* 136*  BUN 17 16  CREATININE 0.98 0.93  CALCIUM 8.3* 8.1*   No results for input(s): "LABPT", "INR" in the last 72 hours. No results for input(s): "LABURIN" in the last 72 hours. Results for orders placed or performed during the hospital encounter of 03/02/23  Surgical PCR screen     Status: None   Collection Time: 03/02/23  4:44 PM   Specimen: Nasal Mucosa; Nasal Swab  Result Value Ref Range Status   MRSA, PCR NEGATIVE NEGATIVE Final   Staphylococcus aureus NEGATIVE NEGATIVE Final    Comment:  (NOTE) The Xpert SA Assay (FDA approved for NASAL specimens in patients 37 years of age and older), is one component of a comprehensive surveillance program. It is not intended to diagnose infection nor to guide or monitor treatment. Performed at [facility], [address], [place], [place] [address]     Studies/Results: No results found.  Assessment/Plan: POD#3 radical cystectomy Adv diet to Reg Diet Scheduled PO APAP BMP monitoring Has seen WOCN Monitor drain Output home asp 81 mg SQH today for DVT Ppx  Will give additional lasix dosage today   LOS: 5 days   Loletta Parish. 03/07/2023, 4:50 PM   S:   1- Bladder Cancer - s/p robotic cystoprostatectomy + senitnal / template node dissection and conduit urinary diversion 03/03/23. Path pending. Admitted 11/7 for bowel prep. JP Cr same as serum 11/11. Proph ASA + heparin startedd POD 3.   2 - Ileus - bowel anastamosis as part of urinary diversion above .NPO initially post-op, ice chips POD1, Clears POD2, some flatus and advanced to full liquid diet POD 3. Starging scheduled reglan POD 3. Resumed bowel function POD 4 and advanced to regular diet.   3 - Disposition / Rehab - lives with independently at baseline. PT eval 11/10 recs rolling walker.   Today "[name]" is progressing. Tolerating fulls w/o emesis. Not much BM or flatus today. DId stomal teahcign with ostomy RC today and wife which went well. Has been walking.   O: NAD,  AOx3, wife at bedside. Walking in room.  Non-labored breathing on RA RRR Stable large truncal obesity, no r/g RLQ Urostomy with somewhat dark but stable / viable with Rt (blue), Lt (red) bander stents and non-foul urine. JP with non-foul serosanguinous output Mild LE edema to below calf, stable  Hgb and Cr excellent Path penidng  A/P Doing well POD 4. Case managmetn consult for hopeful HHRN, continue regular diet. Has ostomy RN appt in house tomorrow which will be  great. LIkely DC 11/14 based on current progress.  Appreciate ostomy RN team, PT comanagement.

## 2023-03-07 NOTE — Plan of Care (Signed)

## 2023-03-07 NOTE — Plan of Care (Signed)
  Problem: Education: °Goal: Knowledge of General Education information will improve °Description: Including pain rating scale, medication(s)/side effects and non-pharmacologic comfort measures °Outcome: Progressing °  °Problem: Clinical Measurements: °Goal: Diagnostic test results will improve °Outcome: Progressing °  °Problem: Activity: °Goal: Risk for activity intolerance will decrease °Outcome: Progressing °  °Problem: Coping: °Goal: Level of anxiety will decrease °Outcome: Progressing °  °

## 2023-03-08 LAB — BASIC METABOLIC PANEL
Anion gap: 8 (ref 5–15)
BUN: 17 mg/dL (ref 8–23)
CO2: 27 mmol/L (ref 22–32)
Calcium: 7.9 mg/dL — ABNORMAL LOW (ref 8.9–10.3)
Chloride: 100 mmol/L (ref 98–111)
Creatinine, Ser: 0.91 mg/dL (ref 0.61–1.24)
GFR, Estimated: >60 mL/min (ref >60)
Glucose, Bld: 126 mg/dL — ABNORMAL HIGH (ref 70–99)
Potassium: 3.2 mmol/L — ABNORMAL LOW (ref 3.5–5.1)
Sodium: 135 mmol/L (ref 135–145)

## 2023-03-08 LAB — HEMOGLOBIN AND HEMATOCRIT, BLOOD
HCT: 29.8 % — ABNORMAL LOW (ref 39.0–52.0)
Hemoglobin: 9.4 g/dL — ABNORMAL LOW (ref 13.0–17.0)

## 2023-03-08 LAB — SURGICAL PATHOLOGY

## 2023-03-08 NOTE — Progress Notes (Signed)
   03/08/23 2100  BiPAP/CPAP/SIPAP  BiPAP/CPAP/SIPAP Pt Type Adult (pt placed himself on CPAP)  Mask Type Full face mask (home mask and tubing)  Patient Home Equipment Yes (pt mask and tubing)  Auto Titrate Yes (min 8, max 14)  CPAP/SIPAP surface wiped down Yes

## 2023-03-08 NOTE — Plan of Care (Signed)
  Problem: Education: Goal: Knowledge of General Education information will improve Description: Including pain rating scale, medication(s)/side effects and non-pharmacologic comfort measures Outcome: Progressing   Problem: Clinical Measurements: Goal: Diagnostic test results will improve Outcome: Progressing   Problem: Elimination: Goal: Will not experience complications related to bowel motility Outcome: Progressing   Problem: Pain Management: Goal: General experience of comfort will improve Outcome: Progressing

## 2023-03-08 NOTE — TOC Initial Note (Addendum)
Transition of Care [facility]) - Initial/Assessment Note    Patient Details  Name: Luis [name] MRN: [id number] Date of Birth: Sep 19, 1955  Transition of Care [facility]) CM/SW Contact:    Lanier Clam, RN Phone Number: 03/08/2023, 3:53 PM  Clinical Narrative: Will f/u on [facility] agency to accept for [facility] ileal conduit.   -4:10p-[facility] rep Amy accepted for HHRN-start of care date 03/13/23 Monday.                Expected Discharge Plan: Home w Home Health Services Barriers to Discharge: Continued Medical Work up   Patient Goals and CMS Choice Patient states their goals for this hospitalization and ongoing recovery are:: Home CMS Medicare.gov Compare Post Acute Care list provided to:: Patient Represenative (must comment) Choice offered to / list presented to : Spouse  ownership interest in [facility]provided to:: Spouse    Expected Discharge Plan and Services   Discharge Planning Services: CM Consult Post Acute Care Choice: Home Health Living arrangements for the past 2 months: Single Family Home                           HH Arranged: RN HH Agency: [facility] Date [facility] Agency Contacted: 03/08/23 Time HH Agency Contacted: [phone] Representative spoke with at [facility] Agency: Amy  Prior Living Arrangements/Services Living arrangements for the past 2 months: Single Family Home Lives with:: Spouse Patient language and need for interpreter reviewed:: Yes Do you feel safe going back to the place where you live?: Yes      Need for Family Participation in Patient Care: Yes (Comment) Care giver support system in place?: Yes (comment)   Criminal Activity/Legal Involvement Pertinent to Current Situation/Hospitalization: No - Comment as needed  Activities of Daily Living   ADL Screening (condition at time of admission) Independently performs ADLs?: Yes (appropriate for developmental age) Is the patient deaf or have difficulty hearing?: No Does the patient have  difficulty seeing, even when wearing glasses/contacts?: No Does the patient have difficulty concentrating, remembering, or making decisions?: No  Permission Sought/Granted Permission sought to share information with : Case Manager Permission granted to share information with : Yes, Verbal Permission Granted  Share Information with NAME: Case Manager           Emotional Assessment Appearance:: Appears stated age Attitude/Demeanor/Rapport: Gracious Affect (typically observed): Accepting Orientation: : Oriented to Self, Oriented to Place, Oriented to  Time, Oriented to Situation Alcohol / Substance Use: Not Applicable Psych Involvement: No (comment)  Admission diagnosis:  Bladder cancer [facility]) [C67.9] Patient Active Problem List   Diagnosis Date Noted   Bladder cancer (HCC) 07/29/2022   S/P pericardial window creation 09/30/2021   Pericardial effusion 09/28/2021   Syncope    PAF (paroxysmal atrial fibrillation) (HCC)    S/P AVR 09/16/2021   Morbid obesity (HCC) 02/29/2016   Essential hypertension    Ascending aortic aneurysm (HCC)    Rhinitis, chronic 01/14/2015   Generalized anxiety disorder 01/14/2015   Aortic stenosis, moderate 01/02/2015   Chronic diastolic CHF (congestive heart failure) (HCC) 06/30/2014   OSA (obstructive sleep apnea) 02/13/2012   Fatigue 01/09/2012   Gall bladder disease 06/16/2011   Hyperlipidemia 02/28/2011   Angina of effort (HCC) 12/08/2010   CAD (coronary artery disease) 12/08/2010   Tobacco abuse 12/08/2010   Aortic stenosis 12/08/2010   ANAL FISSURE 04/23/2009   Internal hemorrhoids 05/22/2008   COLONIC POLYPS 02/07/2008   CAROTID ARTERY STENOSIS 02/07/2008  PCP:  Nelwyn Salisbury, MD Pharmacy:   [id number] - [address], [place] - [address] [place] [address] Phone: [phone] Fax: [phone]     Social Determinants of Health (SDOH) Social History: SDOH Screenings   Food Insecurity: No Food  Insecurity (03/02/2023)  Housing: Low Risk  (03/02/2023)  Transportation Needs: No Transportation Needs (03/02/2023)  Utilities: Not At Risk (03/02/2023)  Alcohol Screen: Low Risk  (11/03/2022)  Depression (PHQ2-9): Low Risk  (11/03/2022)  Recent Concern: Depression (PHQ2-9) - Medium Risk (08/08/2022)  Financial Resource Strain: Low Risk  (11/03/2022)  Physical Activity: Unknown (11/03/2022)  Social Connections: Moderately Isolated (11/03/2022)  Stress: No Stress Concern Present (11/03/2022)  Tobacco Use: Medium Risk (03/03/2023)  Health Literacy: Adequate Health Literacy (11/03/2022)   SDOH Interventions:     Readmission Risk Interventions     No data to display

## 2023-03-08 NOTE — Consult Note (Signed)
WOC Nurse ostomy follow up Stoma type/location: RLQ, ileal conduit  Stomal assessment/size: 1 5/8" , moist, pink, 2 stents in place (1 red/1 blue) Peristomal assessment: intact, crusted blood at sutures at mucocutaneous junction  Treatment options for stomal/peristomal skin: 2" barrier ring  Output pink tinged urine Ostomy pouching: 1pc.flat with 2" barrier ring  Education provided:  Allowed wife to provide pouch change Patient disconnected himself from bedside drainage bag. Wife removed old pouch, monitoring stents during removal. Wife cut new skin barrier, apply barrier ring, we discussed cleaning peristomal skin and mucous from ostomy.  Patient and wife verbalized use of wick when stents have been removed.  Wife placed new pouch with minimal difficulty. Wife hooked patient back to BSD bag.   Answered questions related to showering.  Discussed diet, meds, and risk for peristomal hernia.  Wife has communicated with [employer] about DME coverage and DME connections for ostomy supplies.  (7) pouches, barrier rings in the room for patient DC. (2) urinary adapters and (1) additional BSD in the room. No other questions from patient and wife.    Enrolled patient in [employer] Secure Start Discharge program: Yes  WOC Nurse will follow along with you for continued support with ostomy teaching and care Evaan Tidwell [facility] MSN, RN, [place], CNS, [place] [phone]

## 2023-03-09 ENCOUNTER — Other Ambulatory Visit (HOSPITAL_COMMUNITY): Payer: Self-pay

## 2023-03-09 MED ORDER — OXYCODONE HCL 5 MG PO TABS
5.0000 mg | ORAL_TABLET | Freq: Four times a day (QID) | ORAL | 0 refills | Status: DC | PRN
  Filled 2023-03-09: qty 20, 5d supply, fill #0

## 2023-03-09 NOTE — Discharge Summary (Signed)
Date of admission: 03/02/2023  Date of discharge: 03/09/2023  Admission diagnosis:  Bladder cancer [facility]) [C67.9]   Discharge diagnosis:  Bladder cancer [facility]) [C67.9]  Secondary diagnoses:   Active Ambulatory Problems    Diagnosis Date Noted   COLONIC POLYPS 02/07/2008   CAROTID ARTERY STENOSIS 02/07/2008   Internal hemorrhoids 05/22/2008   ANAL FISSURE 04/23/2009   Angina of effort (HCC) 12/08/2010   CAD (coronary artery disease) 12/08/2010   Tobacco abuse 12/08/2010   Aortic stenosis 12/08/2010   Hyperlipidemia 02/28/2011   Gall bladder disease 06/16/2011   Fatigue 01/09/2012   OSA (obstructive sleep apnea) 02/13/2012   Chronic diastolic CHF (congestive heart failure) (HCC) 06/30/2014   Rhinitis, chronic 01/14/2015   Generalized anxiety disorder 01/14/2015   Aortic stenosis, moderate 01/02/2015   Essential hypertension    Ascending aortic aneurysm (HCC)    Morbid obesity (HCC) 02/29/2016   S/P AVR 09/16/2021   Pericardial effusion 09/28/2021   Syncope    PAF (paroxysmal atrial fibrillation) (HCC)    S/P pericardial window creation 09/30/2021   Bladder cancer (HCC) 07/29/2022   Resolved Ambulatory Problems    Diagnosis Date Noted   No Resolved Ambulatory Problems   Past Medical History:  Diagnosis Date   Anxiety    Arthritis    Cancer (HCC)    Carotid stenosis    Complication of anesthesia    Depression    Dyspnea    GERD (gastroesophageal reflux disease)    Heart murmur    History of colonic polyps    Joint pain    Lesion of bladder    Myocardial infarction (HCC) 2012   Obesity (BMI 30-39.9) 02/29/2016   Pre-diabetes    Restless leg    Sleep apnea    Tubular adenoma of colon    Vertigo      History and Physical: For full details, please see admission history and physical. Briefly, KAMARIE KAMMEYER is a 67 y.o. year old patient who was admitted with high grade recurrent bladder cancer refractory to intravesical chemotherapy and systemic  pembrolizumab.  Hospital Course: Pt admitted and underwent robotic radical cystoprostatectomy and lymph node dissection on 03/03/2023.  Pt had an unremarkable post-operative course. His pain was initially controlled with IV pain medications, and transitioned to oral medicaitons once he was able to tolerate PO.  His diet was slowly advanced, going to FLD on POD3, and Regular Diet on POD4. Pt had first BM on POD3.  JP creatinine was found to be negative on POD3, and was ultimately removed on POD5.  He was continued on his home medications, and diuretics were intermittently used to help with postoperative edema.  He received WOCN teaching for appropriate urostomy changes.   He had no acute complications during his hospital stay.   On the day of discharge, the patient was tolerating a regular diet and their pain was well controlled. They were determined to be stable for discharge home.  He will follow-up in clinic on 03/21/2023 where he will have urine culture taken and discuss removal of his bander stents.   Pathology WAS reviewed with patient.   FINAL MICROSCOPIC DIAGNOSIS:  A. URETER, LEFT DISTAL MARGIN, BIOPSY: Negative for carcinoma  B. URETER, RIGHT DISTAL MARGIN, BIOPSY: Urothelial carcinoma in situ (CIS)  C. SENTINEL LYMPH NODE, LEFT COMMON ILIAC, EXCISION: Metastatic urothelial carcinoma in one of two lymph nodes (1/2, 0.4 cm)  D. LYMPH NODE, LEFT EXTERNAL ILIAC, EXCISION: One benign lymph node (0/1)  E. SENTINEL LYMPH NODE, LEFT OBTURATOR,  EXCISION: One benign lymph node (0/1)  F. SENTINEL LYMPH NODE, RIGHT EXTERNAL ILIAC, EXCISION: Five benign lymph nodes (0/5)  G. SENTINEL LYMPH NODE, RIGHT COMMON, EXCISION: One benign lymph node (0/1)  H. BLADDER AND PROSTATE, CYSTOPROSTATECTOMY: Infiltrative high-grade urothelial carcinoma, size 5.8 cm Involving bladder dome, anterior wall and prostatic stroma. Tumor invades directly into prostatic stroma at apex and mid portion  of the gland (pT4a) Urothelial carcinoma in situ is present at bilateral ureteral orifice, trigone and prostatic urethral Margins of resection are positive for urothelial carcinoma in situ (CIS) at apex urethral and right ureter Other margins of resection are negative for carcinoma  I. URETER, FINAL RIGHT MARGIN, BIOPSY: Urothelial carcinoma in situ  J. URETER, FINAL LEFT MARGIN, BIOPSY: Urothelium with atypia   Laboratory values:  Recent Labs    03/07/23 0504 03/08/23 0447  HGB 9.8* 9.4*  HCT 31.4* 29.8*   Recent Labs    03/07/23 0504 03/08/23 0447  CREATININE 0.93 0.91    Disposition: Home  Discharge medications:  Allergies as of 03/09/2023   No Known Allergies      Medication List     STOP taking these medications    tamsulosin 0.4 MG Caps capsule Commonly known as: FLOMAX       TAKE these medications    aspirin EC 81 MG tablet Take 81 mg by mouth in the morning.   carvedilol 3.125 MG tablet Commonly known as: COREG TAKE 1 TABLET BY MOUTH TWICE A DAY WITH FOOD   diazepam 5 MG tablet Commonly known as: VALIUM Take 1 tablet (5 mg total) by mouth every 12 (twelve) hours as needed for anxiety.   furosemide 20 MG tablet Commonly known as: LASIX TAKE 60MG  IN THE MORNING AND 40MG  IN THE EVENING   ipratropium 0.03 % nasal spray Commonly known as: ATROVENT Place 2 sprays into both nostrils every 12 (twelve) hours as needed for rhinitis.   IRON PO Take 1 tablet by mouth in the morning. 1 tab daily   Klor-Con M20 20 MEQ tablet Generic drug: potassium chloride SA TAKE 2 TABLETS BY MOUTH EVERY DAY What changed:  how much to take when to take this   Magnesium 500 MG Tabs Take 500 mg by mouth in the morning.   meclizine 25 MG tablet Commonly known as: ANTIVERT Take 1 tablet (25 mg total) by mouth 3 (three) times daily as needed for dizziness.   multivitamin-iron-minerals-folic acid chewable tablet Chew 1 tablet by mouth daily.    nitroGLYCERIN 0.4 MG SL tablet Commonly known as: NITROSTAT Place 0.4 mg under the tongue every 5 (five) minutes as needed for chest pain.   oxyCODONE 5 MG immediate release tablet Commonly known as: Roxicodone Take 1 tablet (5 mg total) by mouth every 6 (six) hours as needed for breakthrough pain (post-operatively).   phenazopyridine 200 MG tablet Commonly known as: PYRIDIUM Take 200 mg by mouth every 8 (eight) hours as needed for pain. AZO   rosuvastatin 20 MG tablet Commonly known as: CRESTOR TAKE 1 TABLET BY MOUTH EVERYDAY AT BEDTIME   Tylenol 325 MG tablet Generic drug: acetaminophen Take 650 mg by mouth every 6 (six) hours as needed (pain.).   venlafaxine XR 150 MG 24 hr capsule Commonly known as: EFFEXOR-XR TAKE 1 CAPSULE BY MOUTH DAILY WITH BREAKFAST.        Followup:   Follow-up Information     [name] Delbert Phenix., MD Follow up on 03/21/2023.   Specialty: Urology Why: at 2:45 for MD  visit. Contact information: [address] [place] [address] [phone]         [facility] Follow up.   Why: HH nursing start of care date 03/13/23(Monday) Contact information: [address] [place] [address] [phone]

## 2023-03-09 NOTE — Progress Notes (Signed)
Nursing Discharge Note   Name: Luis [name] MRN: [id number] DOB: May 12, 1955    Admit Date: 03/02/2023  Discharge Date: 03/09/2023   Luis [name] is to be discharged home per MD order.  AVS completed. Reviewed with patient and family at bedside. Highlighted copy provided for patient to take home.  Patient/caregiver able to verbalize understanding of discharge instructions. PIV removed. Patient stable upon discharge.   Transitions of Care Pharmacy delivered home prescription of oxycodone to bedside prior to discharge.  Reviewed specific discharge instructions related to drain site care, stitches, diet, activity, bathing, and when to contact the Doctor. Patient and wife with verbalized understanding.  Urostomy teaching completed. Understands how to change from drainage bag to small pouch. Home health to begin start of care on 03/13/2023. Agency phone number highlighted on AVS.   Allergies as of 03/09/2023   No Known Allergies      Medication List     STOP taking these medications    tamsulosin 0.4 MG Caps capsule Commonly known as: FLOMAX       TAKE these medications    aspirin EC 81 MG tablet Take 81 mg by mouth in the morning.   carvedilol 3.125 MG tablet Commonly known as: COREG TAKE 1 TABLET BY MOUTH TWICE A DAY WITH FOOD   diazepam 5 MG tablet Commonly known as: VALIUM Take 1 tablet (5 mg total) by mouth every 12 (twelve) hours as needed for anxiety.   furosemide 20 MG tablet Commonly known as: LASIX TAKE 60MG  IN THE MORNING AND 40MG  IN THE EVENING   ipratropium 0.03 % nasal spray Commonly known as: ATROVENT Place 2 sprays into both nostrils every 12 (twelve) hours as needed for rhinitis.   IRON PO Take 1 tablet by mouth in the morning. 1 tab daily   Klor-Con M20 20 MEQ tablet Generic drug: potassium chloride SA TAKE 2 TABLETS BY MOUTH EVERY DAY What changed:  how much to take when to take this   Magnesium 500 MG Tabs Take 500 mg by mouth in  the morning.   meclizine 25 MG tablet Commonly known as: ANTIVERT Take 1 tablet (25 mg total) by mouth 3 (three) times daily as needed for dizziness.   multivitamin-iron-minerals-folic acid chewable tablet Chew 1 tablet by mouth daily.   nitroGLYCERIN 0.4 MG SL tablet Commonly known as: NITROSTAT Place 0.4 mg under the tongue every 5 (five) minutes as needed for chest pain.   oxyCODONE 5 MG immediate release tablet Commonly known as: Roxicodone Take 1 tablet (5 mg total) by mouth every 6 (six) hours as needed for breakthrough pain (post-operatively).   phenazopyridine 200 MG tablet Commonly known as: PYRIDIUM Take 200 mg by mouth every 8 (eight) hours as needed for pain. AZO   rosuvastatin 20 MG tablet Commonly known as: CRESTOR TAKE 1 TABLET BY MOUTH EVERYDAY AT BEDTIME   Tylenol 325 MG tablet Generic drug: acetaminophen Take 650 mg by mouth every 6 (six) hours as needed (pain.).   venlafaxine XR 150 MG 24 hr capsule Commonly known as: EFFEXOR-XR TAKE 1 CAPSULE BY MOUTH DAILY WITH BREAKFAST.        Instructions 1- Drain Sites - You may have some mild persistent drainage from old drain site for several days, this is normal. This can be covered with cotton gauze for convenience.   2 - Stiches - Your stitches are all dissolvable. You may notice a "loose thread" at your incisions, these are normal and require no intervention. You may cut  them flush to the skin with fingernail clippers if needed for comfort.   3 - Diet - No restrictions   4 - Activity - No heavy lifting / straining (any activities that require valsalva or "bearing down") x 4 weeks. Otherwise, no restrictions.   5 - Bathing - You may shower immediately. Do not take a bath or get into swimming pool where incision sites are submersed in water x 4 weeks.    6 - When to Call the Doctor - Call MD for any fever >102, any acute wound problems, or any severe nausea / vomiting. You can call the [facility]  Office [phone]) 24 hours a day 365 days a year. It will roll-over to the answering service and on-call physician after hours.   Discharge Instructions/ Education: Discharge instructions given to patient/family with verbalized understanding. Discharge education completed with patient/family including: follow up instructions, medication list, discharge activities, and limitations if indicated. Additional discharge instructions as indicated by discharging provider also reviewed. Patient and family able to verbalize understanding, all questions fully answered. Patient instructed to return to Emergency Department, call 911, or call MD for any changes in condition.  Patient escorted via wheelchair to lobby and discharged home via private automobile.

## 2023-03-10 ENCOUNTER — Telehealth: Payer: Self-pay

## 2023-03-10 NOTE — Transitions of Care (Post Inpatient/ED Visit) (Signed)
   03/10/2023  Name: Luis [name] MRN: [id number] DOB: 29-Nov-1955  Today's TOC FU Call Status: Today's TOC FU Call Status:: Unsuccessful Call (1st Attempt) Unsuccessful Call (1st Attempt) Date: 03/10/23  Attempted to reach the patient regarding the most recent Inpatient/ED visit.  Follow Up Plan: Additional outreach attempts will be made to reach the patient to complete the Transitions of Care (Post Inpatient/ED visit) call.   Lonia Chimera, RN, BSN, CEN [employer]- Transition of Care Team.  [facility] [phone]

## 2023-03-13 ENCOUNTER — Inpatient Hospital Stay: Payer: PPO | Attending: Hematology and Oncology

## 2023-03-13 ENCOUNTER — Inpatient Hospital Stay (HOSPITAL_BASED_OUTPATIENT_CLINIC_OR_DEPARTMENT_OTHER): Payer: PPO | Admitting: Hematology and Oncology

## 2023-03-13 ENCOUNTER — Telehealth: Payer: Self-pay

## 2023-03-13 ENCOUNTER — Other Ambulatory Visit: Payer: Self-pay | Admitting: Hematology and Oncology

## 2023-03-13 VITALS — BP 134/73 | HR 60 | Temp 97.7°F | Resp 13 | Wt 274.4 lb

## 2023-03-13 DIAGNOSIS — Z800 Family history of malignant neoplasm of digestive organs: Secondary | ICD-10-CM | POA: Insufficient documentation

## 2023-03-13 DIAGNOSIS — C673 Malignant neoplasm of anterior wall of bladder: Secondary | ICD-10-CM | POA: Diagnosis present

## 2023-03-13 DIAGNOSIS — Z87891 Personal history of nicotine dependence: Secondary | ICD-10-CM | POA: Insufficient documentation

## 2023-03-13 DIAGNOSIS — C671 Malignant neoplasm of dome of bladder: Secondary | ICD-10-CM | POA: Diagnosis not present

## 2023-03-13 DIAGNOSIS — Z79899 Other long term (current) drug therapy: Secondary | ICD-10-CM | POA: Diagnosis not present

## 2023-03-13 DIAGNOSIS — C679 Malignant neoplasm of bladder, unspecified: Secondary | ICD-10-CM

## 2023-03-13 DIAGNOSIS — R21 Rash and other nonspecific skin eruption: Secondary | ICD-10-CM | POA: Insufficient documentation

## 2023-03-13 LAB — CMP (CANCER CENTER ONLY)
ALT: 26 U/L (ref 0–44)
AST: 19 U/L (ref 15–41)
Albumin: 3.1 g/dL — ABNORMAL LOW (ref 3.5–5.0)
Alkaline Phosphatase: 83 U/L (ref 38–126)
Anion gap: 5 (ref 5–15)
BUN: 12 mg/dL (ref 8–23)
CO2: 30 mmol/L (ref 22–32)
Calcium: 8.2 mg/dL — ABNORMAL LOW (ref 8.9–10.3)
Chloride: 103 mmol/L (ref 98–111)
Creatinine: 0.92 mg/dL (ref 0.61–1.24)
GFR, Estimated: >60 mL/min (ref >60)
Glucose, Bld: 138 mg/dL — ABNORMAL HIGH (ref 70–99)
Potassium: 3.5 mmol/L (ref 3.5–5.1)
Sodium: 138 mmol/L (ref 135–145)
Total Bilirubin: 0.5 mg/dL (ref <1.2)
Total Protein: 6.2 g/dL — ABNORMAL LOW (ref 6.5–8.1)

## 2023-03-13 LAB — CBC WITH DIFFERENTIAL (CANCER CENTER ONLY)
Abs Immature Granulocytes: 0.14 10*3/uL — ABNORMAL HIGH (ref 0.00–0.07)
Basophils Absolute: 0 10*3/uL (ref 0.0–0.1)
Basophils Relative: 0 %
Eosinophils Absolute: 0.6 10*3/uL — ABNORMAL HIGH (ref 0.0–0.5)
Eosinophils Relative: 7 %
HCT: 31.4 % — ABNORMAL LOW (ref 39.0–52.0)
Hemoglobin: 10.1 g/dL — ABNORMAL LOW (ref 13.0–17.0)
Immature Granulocytes: 2 %
Lymphocytes Relative: 18 %
Lymphs Abs: 1.5 10*3/uL (ref 0.7–4.0)
MCH: 29.2 pg (ref 26.0–34.0)
MCHC: 32.2 g/dL (ref 30.0–36.0)
MCV: 90.8 fL (ref 80.0–100.0)
Monocytes Absolute: 0.5 10*3/uL (ref 0.1–1.0)
Monocytes Relative: 7 %
Neutro Abs: 5.5 10*3/uL (ref 1.7–7.7)
Neutrophils Relative %: 66 %
Platelet Count: 255 10*3/uL (ref 150–400)
RBC: 3.46 MIL/uL — ABNORMAL LOW (ref 4.22–5.81)
RDW: 14.6 % (ref 11.5–15.5)
WBC Count: 8.3 10*3/uL (ref 4.0–10.5)
nRBC: 0 % (ref 0.0–0.2)

## 2023-03-13 LAB — TSH: TSH: 1.322 u[IU]/mL (ref 0.350–4.500)

## 2023-03-13 NOTE — Transitions of Care (Post Inpatient/ED Visit) (Signed)
03/13/2023  Name: Luis [name] MRN: [id number] DOB: 05-Oct-1955  Today's TOC FU Call Status: Today's TOC FU Call Status:: Successful TOC FU Call Completed TOC FU Call Complete Date: 03/13/23 Patient's Name and Date of Birth confirmed.  Transition Care Management Follow-up Telephone Call Date of Discharge: 03/09/23 Discharge Facility: [facility]) Type of Discharge: Inpatient Admission Primary Inpatient Discharge Diagnosis:: "bladder CA" How have you been since you were released from the hospital?: Better (Spoke w/ wife-pt resting-states pt not having any pain-has not needed to take any Oxycodone, appetite fair, having regular BMs, "drainage has stopped," napping during the day-doesn't sleep well at night-not a new issue for pt per wife) Any questions or concerns?: No  Items Reviewed: Did you receive and understand the discharge instructions provided?: Yes Medications obtained,verified, and reconciled?: Yes (Medications Reviewed) Any new allergies since your discharge?: No Dietary orders reviewed?: Yes Type of Diet Ordered:: low salt/heart healthy Do you have support at home?: Yes People in Home: spouse Name of Support/Comfort Primary Source: [employer]  Medications Reviewed Today: Medications Reviewed Today     Reviewed by Charlyn Minerva, RN (Registered Nurse) on 03/13/23 at [phone]  Med List Status: <None>   Medication Order Taking? Sig Documenting Provider Last Dose Status Informant  acetaminophen (TYLENOL) 325 MG tablet [id number] Yes Take 650 mg by mouth every 6 (six) hours as needed (pain.). [provider] Taking Active Self  aspirin EC 81 MG tablet [id number] Yes Take 81 mg by mouth in the morning. [provider] Taking Active Self  carvedilol (COREG) 3.125 MG tablet [id number] Yes TAKE 1 TABLET BY MOUTH TWICE A DAY WITH FOOD Laurey Morale, MD Taking Active Self  diazepam (VALIUM) 5 MG tablet [id number] Yes Take 1 tablet (5 mg total) by mouth  every 12 (twelve) hours as needed for anxiety. Nelwyn Salisbury, MD Taking Active Self  Ferrous Sulfate (IRON PO) [id number] Yes Take 1 tablet by mouth in the morning. 1 tab daily [provider] Taking Active Self  furosemide (LASIX) 20 MG tablet [id number] Yes TAKE 60MG  IN THE MORNING AND 40MG  IN THE Lehman Prom, MD Taking Active Self  ipratropium (ATROVENT) 0.03 % nasal spray [id number] Yes Place 2 sprays into both nostrils every 12 (twelve) hours as needed for rhinitis. [provider] Taking Active Self  KLOR-CON M20 20 MEQ tablet [id number] Yes TAKE 2 TABLETS BY MOUTH EVERY DAY  Patient taking differently: Take 20 mEq by mouth 2 (two) times daily.   Laurey Morale, MD Taking Active Self  Magnesium 500 MG TABS [id number] Yes Take 500 mg by mouth in the morning. [provider] Taking Active Self  meclizine (ANTIVERT) 25 MG tablet [id number] Yes Take 1 tablet (25 mg total) by mouth 3 (three) times daily as needed for dizziness. Nelwyn Salisbury, MD Taking Active Self  multivitamin-iron-minerals-folic acid (CENTRUM) chewable tablet [id number] Yes Chew 1 tablet by mouth daily. Barrett, Rae Roam, PA-C Taking Active Self  nitroGLYCERIN (NITROSTAT) 0.4 MG SL tablet [id number]  Place 0.4 mg under the tongue every 5 (five) minutes as needed for chest pain. [provider]  Active Self           Med Note Tiburcio Pea, Jackie Plum Mar 01, 2023 11:18 AM) Needs refills authorized  oxyCODONE (ROXICODONE) 5 MG immediate release tablet [id number] No Take 1 tablet (5 mg total) by mouth every 6 (six) hours as needed for breakthrough pain (post-operatively).  Patient not taking: Reported on  03/13/2023   Loletta Parish., MD Not Taking Active   phenazopyridine (PYRIDIUM) 200 MG tablet [id number] Yes Take 200 mg by mouth every 8 (eight) hours as needed for pain. AZO [provider] Taking Active Self  rosuvastatin (CRESTOR) 20 MG tablet [id number] Yes TAKE 1 TABLET BY  MOUTH EVERYDAY AT BEDTIME [place], Anderson Malta, FNP Taking Active Self  venlafaxine XR (EFFEXOR-XR) 150 MG 24 hr capsule [id number] Yes TAKE 1 CAPSULE BY MOUTH DAILY WITH BREAKFAST. Nelwyn Salisbury, MD Taking Active Self  Med List Note Halford Decamp, CPhT 03/01/23 1119): CPAP at night            Home Care and Equipment/Supplies: Were Home Health Services Ordered?: Yes Name of Home Health Agency:: [facility] Has Agency set up a time to come to your home?: No (Per inpt TOC notes start of care date 03/13/23- discussed with wife- confirmed they have agency contact info and will call if they don't hear from agency by mid day today) Any new equipment or medical supplies ordered?: No  Functional Questionnaire: Do you need assistance with bathing/showering or dressing?: No Do you need assistance with meal preparation?: No Do you need assistance with eating?: No Do you have difficulty maintaining continence: No Do you need assistance with getting out of bed/getting out of a chair/moving?: No Do you have difficulty managing or taking your medications?: No  Follow up appointments reviewed: PCP Follow-up appointment confirmed?: No (seeing several specialists, just saw PCP a few wks ago) MD Provider Line Number:[phone] Given: No Specialist Hospital Follow-up appointment confirmed?: Yes Date of Specialist follow-up appointment?: 03/13/23 Follow-Up Specialty Provider:: Dr. Shaune Pascal, Dr. Manny(urology)-03/21/23 Do you need transportation to your follow-up appointment?: No Do you understand care options if your condition(s) worsen?: Yes-patient verbalized understanding  SDOH Interventions Today    Flowsheet Row Most Recent Value  SDOH Interventions   Food Insecurity Interventions Intervention Not Indicated  Housing Interventions Intervention Not Indicated  Transportation Interventions Intervention Not Indicated  Utilities Interventions Intervention Not Indicated      TOC  interventions discussed/reviewed: -Doctor visit discussed/reviewed -PCP -Doctor visits discussed/reviewed-Specialist -Provided Verbal Education: nutrition, pain/sx mgmt -Pharmacy topic discussed/reviewed-medications & their functions Safety discussed/reviewed  Antionette Fairy, RN,BSN,CCM RN Care Manager Transitions of Care  [place]  Direct Phone: [phone] Toll Free: [phone] Fax: [phone]

## 2023-03-13 NOTE — Progress Notes (Signed)
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 [name]  08/12/2022: Cycle 1 of Pembrolizumab 09/02/2022: Cycle 2 of Pembrolizumab  09/23/2022: Cycle 3 of Pembrolizumab 10/14/2022: Cycle 4 of Pembrolizumab 11/04/2022: Cycle 5 of Pembrolizumab 11/25/2022: Cycle 6 of Pembrolizumab 12/20/2022: Cycle 7 of Pembrolizumab 01/13/2023: Cycle 8 of Pembrolizumab  03/03/2023: Cystectomy/Prostatectomy showed infiltrative high-grade urothelial carcinoma, size 5.8 cm involving bladder dome, anterior wall and prostatic stroma. Tumor invades directly into prostatic stroma at apex and mid portion of the gland (pT4a). Metastatic urothelial cancer in one of two left common iliac lymph nodes.   CHIEF COMPLAINTS/PURPOSE OF CONSULTATION:  "High Risk Non-Muscle Invasive Bladder Cancer "  HISTORY OF PRESENTING ILLNESS:  Luis [name] 67 y.o. 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 In the interim since our last visit he underwent cystoprostatectomy.   On exam today Luis [name] reports his  surgery went well overall.  He has no residual pain or discomfort and has had no issues since then.  He reports that his urine in the urostomy bag has been yellow with some occasional tinges of red.  He notes that he is not having any redness, pain, or oozing at the surgical site though when he rolls over at night he reports he can feel discomfort.  He reports that he was aware of the results of showing positive margin and spread to the lymph node.  He reports that he has been eating well but doing his best to try not to overeat.  He notes that he is also not particular sleeping well.  He has lost about 8 pounds in the interim since her last visit, though he reports he was "hoping to lose more weight when the bladder was removed".  He denies fevers, chills, sweats, shortness of breath, chest pain or cough. He has no other complaints. A full 10 point ROS is otherwise negative.  MEDICAL HISTORY:  Past Medical History:  Diagnosis Date   Anxiety    takes Valium as needed   Aortic stenosis, moderate    Arthritis    Ascending aortic aneurysm (HCC)    CAD (coronary artery disease)    a. s/p PCI of the RCA 8/12 with DES by Dr Excell Seltzer, preserved EF. b. LHC/RHC (2/16) with mean RA 12, PA 32/15, mean PCWP 18, CI 3.47; patent mid and distal RCA stents, 50-60% proximal stenosis small PDA.      Cancer [facility])    bladder   Carotid stenosis    a. Carotid US (05/2013):  Bilateral 1-39% ICA; L thyroid nodule (prior hx of aspiration).   Chronic diastolic CHF (congestive heart failure) (HCC)    Complication of anesthesia    difficulty waking up after gallbladder  surgery   Depression    Dyspnea    Essential hypertension    GERD (gastroesophageal reflux disease)    if needed will take OTC meds    Heart murmur    History of colonic polyps    hyperplastic   Hyperlipidemia    Joint pain    Lesion of bladder    Myocardial infarction (HCC) 2012   Obesity (BMI 30-39.9) 02/29/2016   Pre-diabetes    Restless leg     Sleep apnea    uses cpap   Tubular adenoma of colon    Vertigo    takes Meclizine as needed    SURGICAL HISTORY: Past Surgical History:  Procedure Laterality Date   AORTIC VALVE REPLACEMENT N/A 09/16/2021   Procedure: AORTIC VALVE REPLACEMENT (AVR);  Surgeon: Alleen Borne, MD;  Location: [facility] OR;  Service: Open Heart Surgery;  Laterality: N/A;   CATARACT EXTRACTION   4 YRS AGO   BOTH EYES   CHOLECYSTECTOMY  07/21/2011   Procedure: LAPAROSCOPIC CHOLECYSTECTOMY WITH INTRAOPERATIVE CHOLANGIOGRAM;  Surgeon: Kandis Cocking, MD;  Location: [facility] ORS;  Service: General;  Laterality: N/A;   CORONARY ANGIOPLASTY  2012   2 stents   coronary stenting     s/p PCI of the RCA by Dr Excell Seltzer 8/12 with 2 promus stents   CYSTOSCOPY W/ RETROGRADES Bilateral 12/13/2019   Procedure: CYSTOSCOPY WITH RETROGRADE PYELOGRAM;  Surgeon: Sebastian Ache, MD;  Location: [facility];  Service: Urology;  Laterality: Bilateral;   CYSTOSCOPY W/ RETROGRADES Bilateral 10/14/2020   Procedure: CYSTOSCOPY WITH RETROGRADE PYELOGRAM;  Surgeon: Sebastian Ache, MD;  Location: [facility];  Service: Urology;  Laterality: Bilateral;   CYSTOSCOPY W/ RETROGRADES Bilateral 12/16/2020   Procedure: CYSTOSCOPY WITH RETROGRADE PYELOGRAM;  Surgeon: Sebastian Ache, MD;  Location: [facility];  Service: Urology;  Laterality: Bilateral;   CYSTOSCOPY W/ RETROGRADES Bilateral 06/03/2022   Procedure: CYSTOSCOPY WITH RETROGRADE PYELOGRAM;  Surgeon: Sebastian Ache, MD;  Location: [facility];  Service: Urology;  Laterality: Bilateral;   CYSTOSCOPY W/ RETROGRADES Bilateral 12/28/2022   Procedure: CYSTOSCOPY WITH RETROGRADE PYELOGRAM, FULGARATION OF BLEEDERS;  Surgeon: Loletta Parish., MD;  Location: [facility];  Service: Urology;  Laterality: Bilateral;   CYSTOSCOPY WITH INJECTION N/A 03/03/2023   Procedure: CYSTOSCOPY WITH INDOCYANINE INJECTION;  Surgeon: Loletta Parish., MD;  Location: [facility];  Service:  Urology;  Laterality: N/A;  360 MINUTES   LEFT AND RIGHT HEART CATHETERIZATION WITH CORONARY ANGIOGRAM N/A 06/23/2014   Procedure: LEFT AND RIGHT HEART CATHETERIZATION WITH CORONARY ANGIOGRAM;  Surgeon: Laurey Morale, MD;  Location: [facility] CATH LAB;  Service: Cardiovascular;  Laterality: N/A;   NECK SURGERY  03/23/09   per Dr. Ophelia Charter, cervical fusion    PERICARDIOCENTESIS N/A 09/28/2021   Procedure: PERICARDIOCENTESIS;  Surgeon: Corky Crafts, MD;  Location: [facility] INVASIVE CV LAB;  Service: Cardiovascular;  Laterality: N/A;   REPLACEMENT ASCENDING AORTA N/A 09/16/2021   Procedure: REPLACEMENT ASCENDING AORTA WITH 30 X HEMASHIELD PLATINUM WOVEN DOUBLE VELOUR VASCULAR GRAFT;  Surgeon: Alleen Borne, MD;  Location: [facility] OR;  Service: Open Heart Surgery;  Laterality: N/A;  CIRC ARREST   right elbow surgery     RIGHT HEART CATH AND CORONARY ANGIOGRAPHY N/A 07/08/2021   Procedure: RIGHT HEART CATH AND CORONARY ANGIOGRAPHY;  Surgeon: Laurey Morale, MD;  Location: [facility] INVASIVE CV LAB;  Service: Cardiovascular;  Laterality: N/A;   ROBOT ASSISTED LAPAROSCOPIC COMPLETE CYSTECT ILEAL CONDUIT N/A 03/03/2023   Procedure:  XI ROBOTIC ASSISTED LAPAROSCOPIC COMPLETE CYSTECTECTOMY WITH  ILEAL CONDUIT DIVERSION;  Surgeon: Loletta Parish., MD;  Location: [facility];  Service: Urology;  Laterality: N/A;   ROBOT ASSISTED LAPAROSCOPIC RADICAL PROSTATECTOMY N/A 03/03/2023   Procedure: XI ROBOTIC ASSISTED LAPAROSCOPIC RADICAL PROSTATECTOMY WITH LYMPH NODE DISSECTION;  Surgeon: Loletta Parish., MD;  Location: [facility];  Service: Urology;  Laterality: N/A;   solonscopy  05/23/08   per Dr. Celene Kras hemorrhoids only, repeat in 5 years   SUBXYPHOID PERICARDIAL WINDOW N/A 09/28/2021   Procedure: SUBXYPHOID PERICARDIAL WINDOW;  Surgeon: Alleen Borne, MD;  Location: [facility] OR;  Service: Thoracic;  Laterality: N/A;   TEE WITHOUT CARDIOVERSION N/A 06/23/2014   Procedure: TRANSESOPHAGEAL ECHOCARDIOGRAM (TEE);  Surgeon:  Laurey Morale, MD;  Location: [facility] ENDOSCOPY;  Service: Cardiovascular;  Laterality: N/A;   TEE WITHOUT CARDIOVERSION N/A 01/21/2016   Procedure: TRANSESOPHAGEAL ECHOCARDIOGRAM (TEE);  Surgeon: Laurey Morale, MD;  Location: [facility] ENDOSCOPY;  Service: Cardiovascular;  Laterality: N/A;   TEE WITHOUT CARDIOVERSION N/A 09/16/2021   Procedure: TRANSESOPHAGEAL ECHOCARDIOGRAM (TEE);  Surgeon: Alleen Borne, MD;  Location: [facility] OR;  Service: Open Heart Surgery;  Laterality: N/A;   TEE WITHOUT CARDIOVERSION N/A 09/28/2021   Procedure: TRANSESOPHAGEAL ECHOCARDIOGRAM (TEE);  Surgeon: Alleen Borne, MD;  Location: [facility] OR;  Service: Thoracic;  Laterality: N/A;   TONSILLECTOMY     TRANSURETHRAL RESECTION OF BLADDER TUMOR N/A 10/21/2019   Procedure: TRANSURETHRAL RESECTION OF BLADDER TUMOR (TURBT);  Surgeon: Ihor Gully, MD;  Location: [facility];  Service: Urology;  Laterality: N/A;   TRANSURETHRAL RESECTION OF BLADDER TUMOR N/A 12/13/2019   Procedure: TRANSURETHRAL RESECTION OF BLADDER TUMOR (TURBT);  Surgeon: Sebastian Ache, MD;  Location: [facility];  Service: Urology;  Laterality: N/A;  1 HR   TRANSURETHRAL RESECTION OF BLADDER TUMOR N/A 10/14/2020   Procedure: TRANSURETHRAL RESECTION OF BLADDER TUMOR (TURBT);  Surgeon: Sebastian Ache, MD;  Location: [facility];  Service: Urology;  Laterality: N/A;   TRANSURETHRAL RESECTION OF BLADDER TUMOR N/A 12/16/2020   Procedure: RESTAGING TRANSURETHRAL RESECTION OF BLADDER TUMOR (TURBT);  Surgeon: Sebastian Ache, MD;  Location: [facility];  Service: Urology;  Laterality: N/A;   TRANSURETHRAL RESECTION OF BLADDER TUMOR N/A 06/03/2022   Procedure: TRANSURETHRAL RESECTION OF BLADDER TUMOR (TURBT);  Surgeon: Sebastian Ache, MD;  Location: [facility];  Service: Urology;  Laterality: N/A;   UMBILICAL HERNIA REPAIR  03/03/2023   Procedure: HERNIA REPAIR UMBILICAL;  Surgeon: Loletta Parish., MD;  Location: [facility];   Service: Urology;;    SOCIAL HISTORY: Social History   Socioeconomic History   Marital status: Married    Spouse name: Not on file   Number of children: 4   Years of education: Not on file   Highest education level: Not on file  Occupational History   Occupation: Manufacturing engineer    Employer: [employer]  Tobacco Use   Smoking status: Former    Current packs/day: 0.00    Average packs/day: 2.0 packs/day for 40.0 years (80.0 ttl pk-yrs)    Types: Cigarettes    Start date: 54    Quit date: 2012    Years since quitting: 12.8   Smokeless tobacco: Never  Vaping Use   Vaping status: Never Used  Substance and Sexual Activity   Alcohol use: No    Alcohol/week: 0.0 standard drinks of alcohol   Drug use: No   Sexual activity: Not on file  Other Topics Concern   Not on file  Social History Narrative   Not on file   Social Determinants of Health   Financial Resource Strain: Low Risk  (11/03/2022)   Overall Financial Resource Strain (CARDIA)    Difficulty of Paying Living Expenses: Not hard at all  Food Insecurity: No Food Insecurity (03/13/2023)   Hunger Vital Sign    Worried About Running Out of Food in the Last Year: Never true    Ran Out of Food in the Last Year: Never true  Transportation Needs: No Transportation Needs (03/13/2023)   PRAPARE - Administrator, Civil Service (Medical): No    Lack of Transportation (Non-Medical): No  Physical Activity: Unknown (11/03/2022)   Exercise Vital Sign    Days of Exercise per Week: Patient unable to answer    Minutes of Exercise per Session: 30 min  Stress: No Stress Concern Present (11/03/2022)   [employer] of Occupational Health - Occupational Stress Questionnaire    Feeling of Stress : Not at all  Social Connections: Moderately Isolated (11/03/2022)   Social Connection and Isolation Panel [NHANES]    Frequency of Communication with Friends and Family: More than three times a week    Frequency  of Social Gatherings with Friends and Family: More than three times a week    Attends Religious Services: Never    Database administrator or Organizations: No    Attends Banker Meetings: Never    Marital Status: Married  Catering manager Violence: Patient Unable To Answer (03/13/2023)   Humiliation, Afraid, Rape, and Kick questionnaire    Fear of Current or Ex-Partner: Patient unable to answer    Emotionally Abused: Patient unable to answer    Physically Abused: Patient unable to answer    Sexually Abused: Patient unable to answer    FAMILY HISTORY: Family History  Problem Relation Age of Onset   Lung cancer Mother        lung   Esophageal cancer Cousin    Colon cancer Neg Hx    Rectal cancer Neg Hx    Stomach cancer Neg Hx     ALLERGIES:  has No Known Allergies.  MEDICATIONS:  Current Outpatient Medications  Medication Sig Dispense Refill   acetaminophen (TYLENOL) 325 MG tablet Take 650 mg by mouth every 6 (six) hours as needed (pain.).     aspirin EC 81 MG tablet Take 81 mg by mouth in the morning.     carvedilol (COREG) 3.125 MG tablet TAKE 1 TABLET BY MOUTH TWICE A DAY WITH FOOD 180 tablet 1   diazepam (VALIUM) 5 MG tablet Take 1 tablet (5 mg total) by mouth every 12 (twelve) hours as needed for anxiety. 60 tablet 2   Ferrous Sulfate (IRON PO) Take 1 tablet by mouth in the morning. 1 tab daily     furosemide (LASIX) 20 MG tablet TAKE 60MG  IN THE MORNING AND 40MG  IN THE EVENING 150 tablet 3   ipratropium (ATROVENT) 0.03 % nasal spray Place 2 sprays into both nostrils every 12 (twelve) hours as needed for rhinitis.     KLOR-CON M20 20 MEQ tablet TAKE 2 TABLETS BY MOUTH EVERY DAY (Patient taking differently: Take 20 mEq by mouth 2 (two) times daily.) 180 tablet 1   Magnesium 500 MG TABS Take 500 mg by mouth in the morning.     meclizine (ANTIVERT) 25 MG tablet Take 1 tablet (25 mg total) by mouth 3 (three) times daily as  needed for dizziness. 30 tablet 0    multivitamin-iron-minerals-folic acid (CENTRUM) chewable tablet Chew 1 tablet by mouth daily.     nitroGLYCERIN (NITROSTAT) 0.4 MG SL tablet Place 0.4 mg under the tongue every 5 (five) minutes as needed for chest pain.     oxyCODONE (ROXICODONE) 5 MG immediate release tablet Take 1 tablet (5 mg total) by mouth every 6 (six) hours as needed for breakthrough pain (post-operatively). (Patient not taking: Reported on 03/13/2023) 20 tablet 0   rosuvastatin (CRESTOR) 20 MG tablet TAKE 1 TABLET BY MOUTH EVERYDAY AT BEDTIME 90 tablet 3   venlafaxine XR (EFFEXOR-XR) 150 MG 24 hr capsule TAKE 1 CAPSULE BY MOUTH DAILY WITH BREAKFAST. 90 capsule 0   No current facility-administered medications for this visit.    REVIEW OF SYSTEMS:   Constitutional: ( - ) fevers, ( - )  chills , ( - ) night sweats Eyes: ( - ) blurriness of vision, ( - ) double vision, ( - ) watery eyes Ears, nose, mouth, throat, and face: ( - ) mucositis, ( - ) sore throat Respiratory: ( - ) cough, ( - ) dyspnea, ( - ) wheezes Cardiovascular: ( - ) palpitation, ( - ) chest discomfort, ( - ) lower extremity swelling Gastrointestinal:  ( - ) nausea, ( - ) heartburn, ( - ) change in bowel habits Skin: ( - ) abnormal skin rashes Lymphatics: ( - ) new lymphadenopathy, ( - ) easy bruising Neurological: ( - ) numbness, ( - ) tingling, ( - ) new weaknesses Behavioral/Psych: ( - ) mood change, ( - ) new changes  All other systems were reviewed with the patient and are negative.  PHYSICAL EXAMINATION: ECOG PERFORMANCE STATUS: 1 - Symptomatic but completely ambulatory  Vitals:   03/13/23 1452  BP: 134/73  Pulse: 60  Resp: 13  Temp: 97.7 F (36.5 C)  SpO2: 96%       Filed Weights   03/13/23 1452  Weight: 274 lb 6.4 oz (124.5 kg)     GENERAL: well appearing elderly Caucasian male in NAD  SKIN: skin color, texture, turgor are normal, no rashes or significant lesions EYES: conjunctiva are pink and non-injected, sclera  clear LUNGS: clear to auscultation and percussion with normal breathing effort HEART: regular rate & rhythm and no murmurs and no lower extremity edema Musculoskeletal: no cyanosis of digits and no clubbing  PSYCH: alert & oriented x 3, fluent speech NEURO: no focal motor/sensory deficits  LABORATORY DATA:  I have reviewed the data as listed    Latest Ref Rng & Units 03/13/2023    2:23 PM 03/08/2023    4:47 AM 03/07/2023    5:04 AM  CBC  WBC 4.0 - 10.5 K/uL 8.3     Hemoglobin 13.0 - 17.0 g/dL 16.1  9.4  9.8   Hematocrit 39.0 - 52.0 % 31.4  29.8  31.4   Platelets 150 - 400 K/uL 255          Latest Ref Rng & Units 03/13/2023    2:23 PM 03/08/2023    4:47 AM 03/07/2023    5:04 AM  CMP  Glucose 70 - 99 mg/dL 096  045  409   BUN 8 - 23 mg/dL 12  17  16    Creatinine 0.61 - 1.24 mg/dL 8.11  9.14  7.82   Sodium 135 - 145 mmol/L 138  135  135   Potassium 3.5 - 5.1 mmol/L 3.5  3.2  3.6   Chloride 98 -  111 mmol/L 103  100  102   CO2 22 - 32 mmol/L 30  27  25    Calcium 8.9 - 10.3 mg/dL 8.2  7.9  8.1   Total Protein 6.5 - 8.1 g/dL 6.2     Total Bilirubin <1.2 mg/dL 0.5     Alkaline Phos 38 - 126 U/L 83     AST 15 - 41 U/L 19     ALT 0 - 44 U/L 26       RADIOGRAPHIC STUDIES: No results found.  ASSESSMENT & PLAN Luis [name] 67 y.o. 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  After review of the labs, review of the records, and discussion with the patient the patients findings are most consistent with BCG unresponsive high risk non-muscle invasive bladder cancer, not a candidate for cystectomy.  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 21 days until progression or intolerance up to 24 months. --Started Cycle 1 of Pembrolizumab on 08/12/2022.  PLAN: --Labs from today  were reviewed and adequate for treatment. WBC 8.3, hemoglobin 10.1, MCV 90.8, platelets 255 -- Given the results of the pathology showing positive margins and spread to the lymph node we are considering radiation therapy versus chemotherapy.  For chemotherapy I recommend gemcitabine and cisplatin x 4 cycles.  This would consist of gemcitabine 1000 mg/m on days 1 and 8 and cisplatin 70 mg/m on day 1 of 21-day cycle for 4 cycles. --Additionally prior to the start of adjuvant therapy would allow patient time to recover from his surgery. -- Alternatively adjuvant radiation therapy could be considered.  Will discuss with urology and are GU cancer expert -Return to clinic pending results of our discussion.  # Abdominal Rash--improved -- Small circular rash, approximately 3 inches in diameter. -- Patient has access to hydrocortisone 5% at home, recommend he apply that to the lesion 1-2 times daily to see if there is improvement.  #Supportive Care -- port placement not required  -- no pain medication required at this time.   No orders of the defined types were placed in this encounter.  All questions were answered. The patient knows to call the clinic with any problems, questions or concerns.  I have spent a total of 30 minutes minutes of face-to-face and non-face-to-face time, preparing to see the patient, performing a medically appropriate examination, counseling and educating the patient, documenting clinical information in the electronic health record,and care coordination.   Ulysees Barns, MD Department of Hematology/Oncology [facility] Phone: [phone] Pager: [phone] Email: [email or web address]    03/13/2023 4:57 PM  Literature Support:  Pembrolizumab monotherapy for the treatment of high-risk non-muscle-invasive bladder cancer unresponsive to BCG ([id number]): an open-label, single-arm, multicentre, phase 2 study,The Lancet  Oncology,Volume 22, Issue 7,2021,Pages 919-930,ISSN 2130-8657  -- Pembrolizumab monotherapy was tolerable and showed promising antitumour activity in patients with BCG-unresponsive non-muscle-invasive bladder cancer who declined or were ineligible for radical cystectomy and should be considered a a clinically active non-surgical treatment option in this difficult-to-treat population.  --All enrolled patients were assigned to receive pembrolizumab 200 mg intravenously every 3 weeks for up to 24 months or until centrally confirmed disease persistence, recurrence, or progression; unacceptable toxic effects

## 2023-03-14 DIAGNOSIS — I2510 Atherosclerotic heart disease of native coronary artery without angina pectoris: Secondary | ICD-10-CM | POA: Diagnosis not present

## 2023-03-14 DIAGNOSIS — I110 Hypertensive heart disease with heart failure: Secondary | ICD-10-CM | POA: Diagnosis not present

## 2023-03-14 DIAGNOSIS — I5032 Chronic diastolic (congestive) heart failure: Secondary | ICD-10-CM | POA: Diagnosis not present

## 2023-03-14 DIAGNOSIS — Z436 Encounter for attention to other artificial openings of urinary tract: Secondary | ICD-10-CM | POA: Diagnosis not present

## 2023-03-14 DIAGNOSIS — C679 Malignant neoplasm of bladder, unspecified: Secondary | ICD-10-CM | POA: Diagnosis not present

## 2023-03-14 DIAGNOSIS — M1380 Other specified arthritis, unspecified site: Secondary | ICD-10-CM | POA: Diagnosis not present

## 2023-03-14 DIAGNOSIS — Z48816 Encounter for surgical aftercare following surgery on the genitourinary system: Secondary | ICD-10-CM | POA: Diagnosis not present

## 2023-03-14 DIAGNOSIS — F32A Depression, unspecified: Secondary | ICD-10-CM | POA: Diagnosis not present

## 2023-03-14 DIAGNOSIS — I252 Old myocardial infarction: Secondary | ICD-10-CM | POA: Diagnosis not present

## 2023-03-14 DIAGNOSIS — E785 Hyperlipidemia, unspecified: Secondary | ICD-10-CM | POA: Diagnosis not present

## 2023-03-21 DIAGNOSIS — R8271 Bacteriuria: Secondary | ICD-10-CM | POA: Diagnosis not present

## 2023-03-27 ENCOUNTER — Other Ambulatory Visit: Payer: Self-pay | Admitting: Family Medicine

## 2023-03-29 NOTE — Telephone Encounter (Signed)
LOV 02/10/23

## 2023-03-29 NOTE — Progress Notes (Signed)
Patient ID: Luis [name], male   DOB: 1956/02/03, 67 y.o.   MRN: [id number] PCP: Dr. Clent Ridges Cardiology: Dr. Shirlee Latch  67 y.o. with history of CAD and moderate aortic stenosis presents for follow of AS and CAD. He had RCA PCI in 8/12. Metastatic bladder cancer previously treated with chemo and TURBT, now s/p cystoprostatectomy and lymph node dissection 11/24.  When I saw him in early 2016, he reported significant exertional dyspnea.  Did Lexiscan Cardiolite in 2/16.  This showed no ischemia or infarction.  Echo showed ?bicuspid aortic valve and probably moderate aortic stenosis. Given echo results RHC/LHC and TEE in 2/16. The RHC showed mildly elevated left and right heart filling pressures and the LHC showed nonobstructive CAD.  TEE confirmed bicuspid aortic valve with moderate AS. PFTs in 4/16 that were within normal limits. Started on Lasix 20 mg daily.  He seemed to improve.    Progressive dypnea in fall 2017.  RHC/LHC in 2017 showed mildly elevated left heart filling pressure and nonobstructive CAD.  TEE showed moderate aortic stenosis, bicuspid valve. Stable ascending aorta dilation.   Echo in 9/18 showed EF 60-65% with bicuspid aortic valve and moderate AS.  CTA chest showed 4.5 cm ascending aorta.  Repeat CTA chest in 8/19 showed stable 4.5 cm ascending aorta.  Echo in 8/20 showed EF 60-65%, moderate AS, 4.5 cm ascending aorta. CTA chest in 8/20 with 4.6 cm ascending aorta. Echo in 1/22 showed EF 60-65%, mild LVH, moderate AS with mean gradient 32 and AVA 1.6 cm^2, bicuspid aortic valve, ascending aorta 46 mm. CTA chest in 8/22 with 4.4 cm ascending aorta.   Echo 3/23 showed EF 60-65% with mild LVH, normal RV, bicuspid aortic valve with severe AS and mean gradient 48 mmHg, AVA 0.91 cm^2.  R/LHC showed mild CAD, patent RCA stents, normal PCWP, mildly elevated RA pressure, preserved CO.  Follow up 3/23, continued with exertional dyspnea. Referred to [facility] for evaluation of AVR, arranged for [facility] as  part of work up.   Underwent AV replacement and replacement of ascending aorta with Dr. Laneta Simmers 5/23. Had post-op atrial fibrillation, started on amiodarone/warfarin. Presented back to ED 1 week after discharge with LOC. Echo showed moderate pericardial effusion with maximal diameter of 3 cm over the right ventricle with some compression, LV and aortic valve function normal. Attempted pericardiocentesis in cath lab, but unsuccessful and placed sub-xiphoid pericardial window in OR, with 850 cc fluid removed. Discharged home, weight 275 lbs.  Echo 7/23 showed EF 60-65%, normal RV, stable bioprosthetic aortic valve.  Echo 8/24 showed EF 60-65%, normal RV, stable bioprosthetic aortic valve  He had hematuria and found to have recurrence of bladder cancer, now getting pembrolizumab infusions.   S/p cystoprostatectomy and lymph node dissection 03/03/23. Based on pathology results, Oncology considering chemotherapy with gemcitabine and cisplatin +/- adjunct radiation therapy.    Today he returns for HF follow up. Feeling crummy since procedure, discomfort at surgical location. Endorses diarrhea for a couple weeks with a few instances of incontinence, dark. Poor appetite, not eating very much. No SOB, CP, dizziness, edema, or PND/Orthopnea. Sleeping supine with 1 pillow at home. No fever or chills. Has lost >10lbs in the last month. Taking all medications.   Labs (7/19): LDL 68 Labs (8/19): K 5, creatinine 1.09 Labs (8/20): K 4.9, creatinine 1.09, LDL 59, HDL 50 Labs (8/21): K 4.6, creatinine 1.0 Labs (1/22): K 4, creatinine 1.02, LDL 70 Labs (9/22): BNP 18, K 3.8, creatinine 0.94 Labs (3/23): LDL 61,  HDL 48 Labs (6/23): K 4.1, creatinine 1.0, hgb 8.3, Lp(a) 116 Labs (10/23): K 4.0, creatinine 1.34, LDL 71 Labs (7/24): K 3.7, creatinine 1.10, hgb 14.5 Labs (9/24): K 3.7, creatinine 1.19 Labs (11/24): K 3.5, creatinine 0.92, hgb 10.1  PMH: 1. OSA: Using CPAP.  2. CAD: Cardiolite 8/12 with inferior  infarct and peri-infarct ischemia, had PCI to the mid and distal RCA with Promus DES x 2.  Lexiscan Cardiolite (2/16) with no ischemia or infarction. LHC/RHC (2/16) with mean RA 12, PA 32/15, mean PCWP 18, CI 3.47; patent mid and distal RCA stents, 50-60% proximal stenosis small PDA.   - LHC/RHC (9/17): 60% proximal PDA; mean RA 2, PA 24/8, mean PCWP 18, CI 2.13.  - Cardiolite (8/17): EF 56%, normal.  - R/LHC (3/23, AVR work up): mid RCA lesion 40% stenosis, previously placed prox RCA to Mid RCA stent (unknown type) widely patent, previously placed dist RCA stent (unknown type) widely patent; mean RA 13, PA 23/8, mean PCWP 11, CI 2.65.  3. Carotid stenosis: 2/15 carotid dopplers 1-39% bilateral ICA stenosis.  4. Colon polyps 5. HTN 6. Hyperlipidemia 7. Abdominal US (10/12) with no AAA 8. Aortic stenosis: Echo (2/15) with EF 60-65%, mild LVH, moderate diastolic dysfunction, normal RV size/systolic function, moderate aortic stenosis with mean gradient 31 and AVA 1.16 cm2, ascending aorta 4.4 cm.  Echo (2/16) with EF 60-65%, grade II diastolic dysfunction, ?bicuspid aortic valve with moderate AS (mean gradient 35 mmHg, AVA 1.5 cm^2), mildly dilated RV with normal systolic function.  TEE (2/16) with bicuspid aortic valve, moderate AS mean gradient 30 mmHg, AVA > 1 cm^2, EF 55-60%, mild LVH, mildly dilated RV with normal systolic function, mild AI, mild MR, ascending aorta 4.2 cm.  - Echo (8/17): EF 60-65%, grade II diastolic dysfunction, bicuspid aortic valve with mean gradient 31 mmHg, probably moderate stenosis.  - TEE (9/17): EF 55-60%, bicuspid aortic valve with moderate AS with mean gradient 24 mmHg and AVA 1.05 cm^2, ascending aorta 4.2 cm.  - Echo (9/18): EF 60-65%, bicuspid aortic valve, AVA 1.05 cm^2 with mean gradient 24 mmHg (moderate AS).  - Echo (8/20): EF 60-65%, mild RV dilation with normal systolic function, moderate AS with AVA 1.46 cm^2 and mean gradient 36 mmHg, 4.5 cm ascending aorta.   - Echo (1/22): EF 60-65%, mild LVH, moderate AS with mean gradient 32 and AVA 1.6 cm^2, bicuspid aortic valve, ascending aorta 46 mm.  - Echo (3/23): EF 60-65% with mild LVH, normal RV, bicuspid aortic valve with severe AS and mean gradient 48 mmHg, AVA 0.91 cm^2. 4.5 cm ascending aorta.  - s/p bioprosthetic AVR with a 25 mm Edwards Resilia Aortic Valve and Replacement of Ascending Aorta (5/23). - Echo (7/23): EF 60-65%, normal RV, stable bioprosthetic aortic valve.  - Echo (8/24): EF 60-65%, normal RV, stable bioprosthetic valve 9. Ascending aorta aneurysm: 4.4 cm on 2/15 echo. Ascending aorta 4.2 cm TEE 2/16.  Ascending aorta 4.2 cm on 9/17 TEE.  - CTA chest (9/18): 4.5 cm ascending aorta.  - CTA chest (8/19): 4.5 cm ascending aorta - CTA chest (8/20): 4.6 cm ascending aorta - CTA chest (8/22): 4.4 cm ascending aorta - replacement of ascending aorta 5/23. 10. Chronic diastolic CHF.  11. PFTs (4/16) were within normal limits.  12. Lower extremity arterial dopplers normal in 8/17.  13. Bladder cancer: S/p chemo and TURBT 14. Pericardial tamponade with pericardial window s/p AVR in 5/23.  15. Atrial fibrillation: Paroxysmal, noted post-op AVR.   SH:  Quit smoking in 2013, married, works as Curator.  FH: Father with MI, AVR.  Sister with RyR2 gene.   ROS: All systems reviewed and negative except as per HPI.   Current Outpatient Medications  Medication Sig Dispense Refill   acetaminophen (TYLENOL) 325 MG tablet Take 650 mg by mouth every 6 (six) hours as needed (pain.).     aspirin EC 81 MG tablet Take 81 mg by mouth in the morning.     carvedilol (COREG) 3.125 MG tablet TAKE 1 TABLET BY MOUTH TWICE A DAY WITH FOOD 180 tablet 1   diazepam (VALIUM) 5 MG tablet TAKE 1 TABLET BY MOUTH EVERY 12 HOURS AS NEEDED FOR ANXIETY. 60 tablet 5   Ferrous Sulfate (IRON PO) Take 1 tablet by mouth in the morning. 1 tab daily     furosemide (LASIX) 20 MG tablet TAKE 60MG  IN THE MORNING AND 40MG  IN THE  EVENING 150 tablet 3   ipratropium (ATROVENT) 0.03 % nasal spray Place 2 sprays into both nostrils every 12 (twelve) hours as needed for rhinitis.     KLOR-CON M20 20 MEQ tablet TAKE 2 TABLETS BY MOUTH EVERY DAY (Patient taking differently: Take 20 mEq by mouth 2 (two) times daily.) 180 tablet 1   Magnesium 500 MG TABS Take 500 mg by mouth in the morning.     meclizine (ANTIVERT) 25 MG tablet Take 1 tablet (25 mg total) by mouth 3 (three) times daily as needed for dizziness. 30 tablet 0   multivitamin-iron-minerals-folic acid (CENTRUM) chewable tablet Chew 1 tablet by mouth daily.     nitroGLYCERIN (NITROSTAT) 0.4 MG SL tablet Place 0.4 mg under the tongue every 5 (five) minutes as needed for chest pain.     oxyCODONE (ROXICODONE) 5 MG immediate release tablet Take 1 tablet (5 mg total) by mouth every 6 (six) hours as needed for breakthrough pain (post-operatively). 20 tablet 0   rosuvastatin (CRESTOR) 20 MG tablet TAKE 1 TABLET BY MOUTH EVERYDAY AT BEDTIME 90 tablet 3   venlafaxine XR (EFFEXOR-XR) 150 MG 24 hr capsule TAKE 1 CAPSULE BY MOUTH DAILY WITH BREAKFAST. 90 capsule 0   No current facility-administered medications for this encounter.   Wt Readings from Last 3 Encounters:  03/31/23 117.9 kg (260 lb)  03/13/23 124.5 kg (274 lb 6.4 oz)  03/02/23 130.3 kg (287 lb 4.8 oz)   BP 124/76   Pulse (!) 54   Wt 117.9 kg (260 lb)   SpO2 99%   BMI 41.97 kg/m   Physical Exam General:  NAD. Melancholy. No resp difficulty, walked into clinic HEENT: Normal Neck: Supple. No JVD, thick neck Carotids 2+ bilat; no bruits. No lymphadenopathy or thryomegaly appreciated. Cor: PMI nondisplaced. Sinus brady in 50-60s. No rubs, gallops or murmurs. Lungs: Clear, bases diminished Abdomen: Firm, nontender, obese, nondistended. No hepatosplenomegaly. No bruits or masses. Good bowel sounds. Urostomy present. Extremities: No cyanosis, clubbing, rash, no BLE edema Neuro: Alert & oriented x 3, cranial nerves  grossly intact. Moves all 4 extremities w/o difficulty. Affect flat, pleasant.  Assessment/Plan: 1. CAD: Prior PCI to RCA in 8/12.  LHC (9/17) with nonobstructive disease. He was started on Imdur for ?microvascular angina but he developed severe headaches without any improvement in symptoms.  Underwent coronary angiography (3/23) as part of AVR work up and showed patent RCA stents with nonobstructive mild disease. No chest pain now.  - Continue ASA 81 and Crestor.  2. Aortic stenosis: Bicuspid valve with severe AS on 3/23 echo.  He  is now s/p bioprosthetic AV replacement and replacement of ascending aorta 5/23. Echo in 7/23 showed normal bioprosthetic aortic valve. Echo 8/24 showed stable bioprosthetic valve. 3. Hyperlipidemia: Continue Crestor.  4. Ascending aortic aneurysm: Associated with bicuspid aortic valve.  S/p replacement of ascending aorta 5/23. 5. Chronic diastolic CHF: Stable NYHA class II symptoms. Appears euvolemic on exam. - Continue to hold Jardiance in post operative period with urostomy. Risks for UTI > benefits at this point. - Continue Lasix 60 mg q am/40 mg q pm. BMET today. - Continue carvedilol 3.125 mg bid.  - Avoid ibuprofen use, would use Tylenol for aches/pains.  6. OSA: Continue CPAP.  7. HTN: BP controlled.  8. Pericardial effusion: s/p pericardial window 6/23 post-op.  - Resolved on most recent echo 8/24.  9. Atrial fibrillation: Paroxysmal, only noted post-op. Anticoagulation was stopped with pericardial effusion and later hematuria. Regular rate on exam.  - If AF recurs, will need DOAC.  10. Metastatic Bladder CA: Treated with pembrolizumab. Now S/p cystoprostatectomy and lymph node dissection 03/03/23. Based on pathology results, oncology considering chemotherapy with gemcitabine and cisplatin +/- adjunct radiation therapy.   - Ongoing psychosocial needs assessment per oncology - Situational depression  Follow up: 3 months with Dr. McLean  [place] Chelan Heringer,  NP 03/31/2023

## 2023-03-30 DIAGNOSIS — Z436 Encounter for attention to other artificial openings of urinary tract: Secondary | ICD-10-CM | POA: Diagnosis not present

## 2023-03-31 ENCOUNTER — Encounter (HOSPITAL_COMMUNITY): Payer: Self-pay

## 2023-03-31 ENCOUNTER — Ambulatory Visit (HOSPITAL_COMMUNITY)
Admission: RE | Admit: 2023-03-31 | Discharge: 2023-03-31 | Disposition: A | Discharge status: Home / Self Care | Payer: PPO | Source: Ambulatory Visit | Attending: Cardiology | Admitting: Cardiology

## 2023-03-31 VITALS — BP 124/76 | HR 54 | Wt 260.0 lb

## 2023-03-31 DIAGNOSIS — I2510 Atherosclerotic heart disease of native coronary artery without angina pectoris: Secondary | ICD-10-CM | POA: Diagnosis not present

## 2023-03-31 DIAGNOSIS — Z87891 Personal history of nicotine dependence: Secondary | ICD-10-CM | POA: Insufficient documentation

## 2023-03-31 DIAGNOSIS — I25119 Atherosclerotic heart disease of native coronary artery with unspecified angina pectoris: Secondary | ICD-10-CM | POA: Diagnosis not present

## 2023-03-31 DIAGNOSIS — R319 Hematuria, unspecified: Secondary | ICD-10-CM | POA: Insufficient documentation

## 2023-03-31 DIAGNOSIS — F4321 Adjustment disorder with depressed mood: Secondary | ICD-10-CM | POA: Diagnosis not present

## 2023-03-31 DIAGNOSIS — I350 Nonrheumatic aortic (valve) stenosis: Secondary | ICD-10-CM | POA: Insufficient documentation

## 2023-03-31 DIAGNOSIS — Z953 Presence of xenogenic heart valve: Secondary | ICD-10-CM | POA: Insufficient documentation

## 2023-03-31 DIAGNOSIS — Z955 Presence of coronary angioplasty implant and graft: Secondary | ICD-10-CM | POA: Diagnosis not present

## 2023-03-31 DIAGNOSIS — I5032 Chronic diastolic (congestive) heart failure: Secondary | ICD-10-CM

## 2023-03-31 DIAGNOSIS — Z9079 Acquired absence of other genital organ(s): Secondary | ICD-10-CM | POA: Diagnosis not present

## 2023-03-31 DIAGNOSIS — I110 Hypertensive heart disease with heart failure: Secondary | ICD-10-CM | POA: Insufficient documentation

## 2023-03-31 DIAGNOSIS — C679 Malignant neoplasm of bladder, unspecified: Secondary | ICD-10-CM

## 2023-03-31 DIAGNOSIS — I7121 Aneurysm of the ascending aorta, without rupture: Secondary | ICD-10-CM | POA: Insufficient documentation

## 2023-03-31 DIAGNOSIS — Z79899 Other long term (current) drug therapy: Secondary | ICD-10-CM | POA: Insufficient documentation

## 2023-03-31 DIAGNOSIS — G4733 Obstructive sleep apnea (adult) (pediatric): Secondary | ICD-10-CM

## 2023-03-31 DIAGNOSIS — I480 Paroxysmal atrial fibrillation: Secondary | ICD-10-CM

## 2023-03-31 DIAGNOSIS — I159 Secondary hypertension, unspecified: Secondary | ICD-10-CM | POA: Diagnosis not present

## 2023-03-31 DIAGNOSIS — I3139 Other pericardial effusion (noninflammatory): Secondary | ICD-10-CM | POA: Insufficient documentation

## 2023-03-31 DIAGNOSIS — C775 Secondary and unspecified malignant neoplasm of intrapelvic lymph nodes: Secondary | ICD-10-CM

## 2023-03-31 DIAGNOSIS — E785 Hyperlipidemia, unspecified: Secondary | ICD-10-CM | POA: Diagnosis not present

## 2023-03-31 DIAGNOSIS — Z8551 Personal history of malignant neoplasm of bladder: Secondary | ICD-10-CM | POA: Insufficient documentation

## 2023-03-31 LAB — CBC
HCT: 33 % — ABNORMAL LOW (ref 39.0–52.0)
Hemoglobin: 10.4 g/dL — ABNORMAL LOW (ref 13.0–17.0)
MCH: 27.4 pg (ref 26.0–34.0)
MCHC: 31.5 g/dL (ref 30.0–36.0)
MCV: 87.1 fL (ref 80.0–100.0)
Platelets: 265 10*3/uL (ref 150–400)
RBC: 3.79 MIL/uL — ABNORMAL LOW (ref 4.22–5.81)
RDW: 14.3 % (ref 11.5–15.5)
WBC: 9 10*3/uL (ref 4.0–10.5)
nRBC: 0 % (ref 0.0–0.2)

## 2023-03-31 LAB — BASIC METABOLIC PANEL
Anion gap: 12 (ref 5–15)
BUN: 17 mg/dL (ref 8–23)
CO2: 24 mmol/L (ref 22–32)
Calcium: 8.7 mg/dL — ABNORMAL LOW (ref 8.9–10.3)
Chloride: 101 mmol/L (ref 98–111)
Creatinine, Ser: 1.53 mg/dL — ABNORMAL HIGH (ref 0.61–1.24)
GFR, Estimated: 49 mL/min — ABNORMAL LOW (ref >60)
Glucose, Bld: 143 mg/dL — ABNORMAL HIGH (ref 70–99)
Potassium: 3.9 mmol/L (ref 3.5–5.1)
Sodium: 137 mmol/L (ref 135–145)

## 2023-03-31 NOTE — Patient Instructions (Addendum)
Thank you for coming in today  If you had labs drawn today, any labs that are abnormal the clinic will call you No news is good news  Medications: Ok to use over the counter Metamucil   Follow up appointments:  Your physician recommends that you schedule a follow-up appointment in:  3 months With Dr. Earlean Shawl will receive a reminder letter in the mail a few months in advance. If you don't receive a letter, please call our office to schedule the follow-up appointment.    Do the following things EVERYDAY: Weigh yourself in the morning before breakfast. Write it down and keep it in a log. Take your medicines as prescribed Eat low salt foods--Limit salt (sodium) to 2000 mg per day.  Stay as active as you can everyday Limit all fluids for the day to less than 2 liters   At the [facility], you and your health needs are our priority. As part of our continuing mission to provide you with exceptional heart care, we have created designated Provider Care Teams. These Care Teams include your primary Cardiologist (physician) and Advanced Practice Providers (APPs- Physician Assistants and Nurse Practitioners) who all work together to provide you with the care you need, when you need it.   You may see any of the following providers on your designated Care Team at your next follow up: Dr Arvilla Meres Dr Marca Ancona Dr. Marcos Eke, NP Robbie Lis, [place] [facility], [place] Brynda Peon, NP Karle Plumber, PharmD   Please be sure to bring in all your medications bottles to every appointment.    Thank you for choosing [facility]  If you have any questions or concerns before your next appointment please send [place] a message through [place] or call our office at [phone].    TO LEAVE A MESSAGE FOR THE NURSE SELECT OPTION 2, PLEASE LEAVE A MESSAGE INCLUDING: YOUR NAME DATE OF BIRTH CALL BACK  NUMBER REASON FOR CALL**this is important as we prioritize the call backs  YOU WILL RECEIVE A CALL BACK THE SAME DAY AS LONG AS YOU CALL BEFORE 4:00 PM

## 2023-04-05 ENCOUNTER — Other Ambulatory Visit (HOSPITAL_COMMUNITY): Payer: Self-pay | Admitting: Cardiology

## 2023-04-05 ENCOUNTER — Telehealth (HOSPITAL_COMMUNITY): Payer: Self-pay | Admitting: *Deleted

## 2023-04-05 ENCOUNTER — Encounter (HOSPITAL_COMMUNITY): Payer: Self-pay | Admitting: Cardiology

## 2023-04-05 DIAGNOSIS — Z79899 Other long term (current) drug therapy: Secondary | ICD-10-CM

## 2023-04-05 DIAGNOSIS — I5032 Chronic diastolic (congestive) heart failure: Secondary | ICD-10-CM

## 2023-04-05 MED ORDER — POTASSIUM CHLORIDE CRYS ER 20 MEQ PO TBCR: 40.0000 meq | EXTENDED_RELEASE_TABLET | Freq: Every day | ORAL | 3 refills | Status: DC

## 2023-04-05 MED ORDER — FUROSEMIDE 20 MG PO TABS: 40.0000 mg | ORAL_TABLET | Freq: Two times a day (BID) | ORAL | 3 refills | Status: DC

## 2023-04-05 NOTE — Telephone Encounter (Signed)
Called patient and wife per [place] Lee, NP with following lab results and instructions:  "Renal function elevated  Hold Lasix for 2 days. Then resume at 40 mg bid.  Repeat BMET in 10-14 days."  Both verbalized understanding of same. Meds updated, lab ordered and schedule. Asked them to call [phone] if any questions or concerns.

## 2023-04-07 ENCOUNTER — Other Ambulatory Visit: Payer: Self-pay | Admitting: Hematology and Oncology

## 2023-04-07 DIAGNOSIS — C679 Malignant neoplasm of bladder, unspecified: Secondary | ICD-10-CM

## 2023-04-07 NOTE — Progress Notes (Signed)
DISCONTINUE ON PATHWAY REGIMEN - Bladder     A cycle is every 21 days:     Pembrolizumab   **Always confirm dose/schedule in your pharmacy ordering system**  PRIOR TREATMENT: [id number]: Pembrolizumab 200 mg q21 Days Until Progression,  Unacceptable Toxicity, or up to 24 Months  START ON PATHWAY REGIMEN - Bladder     A cycle is every 21 days:     Gemcitabine      Cisplatin   **Always confirm dose/schedule in your pharmacy ordering system**  Patient Characteristics: Post-Cystectomy without Neoadjuvant Therapy, M0 (Pathologic Staging), pT0-4a, pN1, M0 Therapeutic Status: Post-Cystectomy without Neoadjuvant Therapy, M0 (Pathologic Staging) AJCC M Category: cM0 AJCC 8 Stage Grouping: IIIA AJCC T Category: pT4a AJCC N Category: pN1 Intent of Therapy: Curative Intent, Discussed with Patient

## 2023-04-08 ENCOUNTER — Other Ambulatory Visit: Payer: Self-pay

## 2023-04-10 DIAGNOSIS — Z8551 Personal history of malignant neoplasm of bladder: Secondary | ICD-10-CM | POA: Diagnosis not present

## 2023-04-10 DIAGNOSIS — Z936 Other artificial openings of urinary tract status: Secondary | ICD-10-CM | POA: Diagnosis not present

## 2023-04-13 ENCOUNTER — Emergency Department (HOSPITAL_COMMUNITY): Payer: PPO

## 2023-04-13 ENCOUNTER — Other Ambulatory Visit: Payer: Self-pay

## 2023-04-13 ENCOUNTER — Telehealth: Payer: Self-pay | Admitting: Hematology and Oncology

## 2023-04-13 ENCOUNTER — Telehealth: Payer: Self-pay | Admitting: *Deleted

## 2023-04-13 ENCOUNTER — Inpatient Hospital Stay (HOSPITAL_COMMUNITY)
Admission: EM | Admit: 2023-04-13 | Discharge: 2023-04-18 | DRG: 871 | Disposition: A | Discharge status: Home / Self Care | Payer: PPO | Attending: Internal Medicine | Admitting: Internal Medicine

## 2023-04-13 ENCOUNTER — Other Ambulatory Visit: Payer: PPO | Attending: Hematology and Oncology

## 2023-04-13 ENCOUNTER — Encounter (HOSPITAL_COMMUNITY): Payer: Self-pay

## 2023-04-13 DIAGNOSIS — R109 Unspecified abdominal pain: Secondary | ICD-10-CM | POA: Diagnosis not present

## 2023-04-13 DIAGNOSIS — Z2239 Carrier of other specified bacterial diseases: Secondary | ICD-10-CM

## 2023-04-13 DIAGNOSIS — Z9049 Acquired absence of other specified parts of digestive tract: Secondary | ICD-10-CM | POA: Diagnosis not present

## 2023-04-13 DIAGNOSIS — E876 Hypokalemia: Secondary | ICD-10-CM | POA: Diagnosis present

## 2023-04-13 DIAGNOSIS — Z8551 Personal history of malignant neoplasm of bladder: Secondary | ICD-10-CM | POA: Diagnosis not present

## 2023-04-13 DIAGNOSIS — Z95818 Presence of other cardiac implants and grafts: Secondary | ICD-10-CM | POA: Diagnosis not present

## 2023-04-13 DIAGNOSIS — A419 Sepsis, unspecified organism: Secondary | ICD-10-CM | POA: Diagnosis not present

## 2023-04-13 DIAGNOSIS — G4733 Obstructive sleep apnea (adult) (pediatric): Secondary | ICD-10-CM | POA: Diagnosis not present

## 2023-04-13 DIAGNOSIS — E861 Hypovolemia: Secondary | ICD-10-CM | POA: Diagnosis not present

## 2023-04-13 DIAGNOSIS — A498 Other bacterial infections of unspecified site: Secondary | ICD-10-CM | POA: Diagnosis present

## 2023-04-13 DIAGNOSIS — R7881 Bacteremia: Secondary | ICD-10-CM | POA: Diagnosis present

## 2023-04-13 DIAGNOSIS — I252 Old myocardial infarction: Secondary | ICD-10-CM | POA: Diagnosis not present

## 2023-04-13 DIAGNOSIS — R7303 Prediabetes: Secondary | ICD-10-CM | POA: Diagnosis present

## 2023-04-13 DIAGNOSIS — Z23 Encounter for immunization: Secondary | ICD-10-CM | POA: Diagnosis not present

## 2023-04-13 DIAGNOSIS — Z860101 Personal history of adenomatous and serrated colon polyps: Secondary | ICD-10-CM

## 2023-04-13 DIAGNOSIS — Z79899 Other long term (current) drug therapy: Secondary | ICD-10-CM

## 2023-04-13 DIAGNOSIS — I5032 Chronic diastolic (congestive) heart failure: Secondary | ICD-10-CM | POA: Diagnosis present

## 2023-04-13 DIAGNOSIS — Z720 Tobacco use: Secondary | ICD-10-CM

## 2023-04-13 DIAGNOSIS — Z952 Presence of prosthetic heart valve: Secondary | ICD-10-CM

## 2023-04-13 DIAGNOSIS — C679 Malignant neoplasm of bladder, unspecified: Secondary | ICD-10-CM | POA: Diagnosis present

## 2023-04-13 DIAGNOSIS — E785 Hyperlipidemia, unspecified: Secondary | ICD-10-CM | POA: Diagnosis present

## 2023-04-13 DIAGNOSIS — D630 Anemia in neoplastic disease: Secondary | ICD-10-CM | POA: Diagnosis present

## 2023-04-13 DIAGNOSIS — I350 Nonrheumatic aortic (valve) stenosis: Secondary | ICD-10-CM | POA: Diagnosis present

## 2023-04-13 DIAGNOSIS — I480 Paroxysmal atrial fibrillation: Secondary | ICD-10-CM | POA: Diagnosis not present

## 2023-04-13 DIAGNOSIS — N179 Acute kidney failure, unspecified: Principal | ICD-10-CM

## 2023-04-13 DIAGNOSIS — A4181 Sepsis due to Enterococcus: Principal | ICD-10-CM | POA: Diagnosis present

## 2023-04-13 DIAGNOSIS — B9689 Other specified bacterial agents as the cause of diseases classified elsewhere: Secondary | ICD-10-CM | POA: Diagnosis not present

## 2023-04-13 DIAGNOSIS — E440 Moderate protein-calorie malnutrition: Secondary | ICD-10-CM | POA: Diagnosis present

## 2023-04-13 DIAGNOSIS — R6520 Severe sepsis without septic shock: Secondary | ICD-10-CM | POA: Diagnosis not present

## 2023-04-13 DIAGNOSIS — G2581 Restless legs syndrome: Secondary | ICD-10-CM | POA: Diagnosis not present

## 2023-04-13 DIAGNOSIS — Z801 Family history of malignant neoplasm of trachea, bronchus and lung: Secondary | ICD-10-CM

## 2023-04-13 DIAGNOSIS — E669 Obesity, unspecified: Secondary | ICD-10-CM | POA: Diagnosis present

## 2023-04-13 DIAGNOSIS — Z87891 Personal history of nicotine dependence: Secondary | ICD-10-CM

## 2023-04-13 DIAGNOSIS — Z936 Other artificial openings of urinary tract status: Secondary | ICD-10-CM

## 2023-04-13 DIAGNOSIS — R579 Shock, unspecified: Secondary | ICD-10-CM | POA: Diagnosis not present

## 2023-04-13 DIAGNOSIS — Z6841 Body Mass Index (BMI) 40.0 and over, adult: Secondary | ICD-10-CM | POA: Diagnosis not present

## 2023-04-13 DIAGNOSIS — R6521 Severe sepsis with septic shock: Secondary | ICD-10-CM | POA: Diagnosis not present

## 2023-04-13 DIAGNOSIS — I110 Hypertensive heart disease with heart failure: Secondary | ICD-10-CM | POA: Diagnosis not present

## 2023-04-13 DIAGNOSIS — I2510 Atherosclerotic heart disease of native coronary artery without angina pectoris: Secondary | ICD-10-CM | POA: Diagnosis not present

## 2023-04-13 DIAGNOSIS — Z229 Carrier of infectious disease, unspecified: Secondary | ICD-10-CM | POA: Diagnosis not present

## 2023-04-13 DIAGNOSIS — Z7982 Long term (current) use of aspirin: Secondary | ICD-10-CM

## 2023-04-13 DIAGNOSIS — Z955 Presence of coronary angioplasty implant and graft: Secondary | ICD-10-CM

## 2023-04-13 DIAGNOSIS — Z1624 Resistance to multiple antibiotics: Secondary | ICD-10-CM | POA: Diagnosis present

## 2023-04-13 DIAGNOSIS — I330 Acute and subacute infective endocarditis: Secondary | ICD-10-CM | POA: Diagnosis not present

## 2023-04-13 DIAGNOSIS — I509 Heart failure, unspecified: Secondary | ICD-10-CM | POA: Diagnosis not present

## 2023-04-13 DIAGNOSIS — Z953 Presence of xenogenic heart valve: Secondary | ICD-10-CM

## 2023-04-13 DIAGNOSIS — R918 Other nonspecific abnormal finding of lung field: Secondary | ICD-10-CM | POA: Diagnosis not present

## 2023-04-13 DIAGNOSIS — I10 Essential (primary) hypertension: Secondary | ICD-10-CM | POA: Diagnosis not present

## 2023-04-13 DIAGNOSIS — B952 Enterococcus as the cause of diseases classified elsewhere: Secondary | ICD-10-CM | POA: Diagnosis not present

## 2023-04-13 DIAGNOSIS — R001 Bradycardia, unspecified: Secondary | ICD-10-CM | POA: Diagnosis not present

## 2023-04-13 DIAGNOSIS — I340 Nonrheumatic mitral (valve) insufficiency: Secondary | ICD-10-CM | POA: Diagnosis not present

## 2023-04-13 DIAGNOSIS — I440 Atrioventricular block, first degree: Secondary | ICD-10-CM | POA: Diagnosis present

## 2023-04-13 DIAGNOSIS — Z0389 Encounter for observation for other suspected diseases and conditions ruled out: Secondary | ICD-10-CM | POA: Diagnosis not present

## 2023-04-13 DIAGNOSIS — K760 Fatty (change of) liver, not elsewhere classified: Secondary | ICD-10-CM | POA: Diagnosis not present

## 2023-04-13 DIAGNOSIS — Z954 Presence of other heart-valve replacement: Secondary | ICD-10-CM

## 2023-04-13 DIAGNOSIS — Z800 Family history of malignant neoplasm of digestive organs: Secondary | ICD-10-CM

## 2023-04-13 DIAGNOSIS — F418 Other specified anxiety disorders: Secondary | ICD-10-CM | POA: Diagnosis present

## 2023-04-13 LAB — COMPREHENSIVE METABOLIC PANEL
ALT: 32 U/L (ref 0–44)
AST: 36 U/L (ref 15–41)
Albumin: 3.2 g/dL — ABNORMAL LOW (ref 3.5–5.0)
Alkaline Phosphatase: 136 U/L — ABNORMAL HIGH (ref 38–126)
Anion gap: 14 (ref 5–15)
BUN: 33 mg/dL — ABNORMAL HIGH (ref 8–23)
CO2: 19 mmol/L — ABNORMAL LOW (ref 22–32)
Calcium: 8.8 mg/dL — ABNORMAL LOW (ref 8.9–10.3)
Chloride: 99 mmol/L (ref 98–111)
Creatinine, Ser: 2.42 mg/dL — ABNORMAL HIGH (ref 0.61–1.24)
GFR, Estimated: 28 mL/min — ABNORMAL LOW (ref >60)
Glucose, Bld: 150 mg/dL — ABNORMAL HIGH (ref 70–99)
Potassium: 4.2 mmol/L (ref 3.5–5.1)
Sodium: 132 mmol/L — ABNORMAL LOW (ref 135–145)
Total Bilirubin: 1 mg/dL (ref <1.2)
Total Protein: 8.9 g/dL — ABNORMAL HIGH (ref 6.5–8.1)

## 2023-04-13 LAB — BLOOD CULTURE ID PANEL (REFLEXED) - BCID2

## 2023-04-13 LAB — RESP PANEL BY RT-PCR (RSV, FLU A&B, COVID)  RVPGX2
Influenza A by PCR: NEGATIVE
Influenza B by PCR: NEGATIVE
Resp Syncytial Virus by PCR: NEGATIVE
SARS Coronavirus 2 by RT PCR: NEGATIVE

## 2023-04-13 LAB — URINALYSIS, W/ REFLEX TO CULTURE (INFECTION SUSPECTED)
Bilirubin Urine: NEGATIVE
Glucose, UA: NEGATIVE mg/dL
Ketones, ur: NEGATIVE mg/dL
Nitrite: NEGATIVE
Protein, ur: 100 mg/dL — AB
Specific Gravity, Urine: 1.011 (ref 1.005–1.030)
WBC, UA: >50 WBC/hpf (ref 0–5)
pH: 7 (ref 5.0–8.0)

## 2023-04-13 LAB — GLUCOSE, CAPILLARY: Glucose-Capillary: 109 mg/dL — ABNORMAL HIGH (ref 70–99)

## 2023-04-13 LAB — CBC WITH DIFFERENTIAL/PLATELET
Abs Immature Granulocytes: 0.13 10*3/uL — ABNORMAL HIGH (ref 0.00–0.07)
Basophils Absolute: 0 10*3/uL (ref 0.0–0.1)
Basophils Relative: 0 %
Eosinophils Absolute: 0.1 10*3/uL (ref 0.0–0.5)
Eosinophils Relative: 0 %
HCT: 31.8 % — ABNORMAL LOW (ref 39.0–52.0)
Hemoglobin: 9.8 g/dL — ABNORMAL LOW (ref 13.0–17.0)
Immature Granulocytes: 1 %
Lymphocytes Relative: 4 %
Lymphs Abs: 0.9 10*3/uL (ref 0.7–4.0)
MCH: 26.5 pg (ref 26.0–34.0)
MCHC: 30.8 g/dL (ref 30.0–36.0)
MCV: 85.9 fL (ref 80.0–100.0)
Monocytes Absolute: 0.9 10*3/uL (ref 0.1–1.0)
Monocytes Relative: 5 %
Neutro Abs: 17.7 10*3/uL — ABNORMAL HIGH (ref 1.7–7.7)
Neutrophils Relative %: 90 %
Platelets: 260 10*3/uL (ref 150–400)
RBC: 3.7 MIL/uL — ABNORMAL LOW (ref 4.22–5.81)
RDW: 15.7 % — ABNORMAL HIGH (ref 11.5–15.5)
WBC: 19.6 10*3/uL — ABNORMAL HIGH (ref 4.0–10.5)
nRBC: 0 % (ref 0.0–0.2)

## 2023-04-13 LAB — PROTIME-INR
INR: 1.3 — ABNORMAL HIGH (ref 0.8–1.2)
Prothrombin Time: 16.1 s — ABNORMAL HIGH (ref 11.4–15.2)

## 2023-04-13 LAB — APTT: aPTT: 38 s — ABNORMAL HIGH (ref 24–36)

## 2023-04-13 LAB — MRSA NEXT GEN BY PCR, NASAL: MRSA by PCR Next Gen: NOT DETECTED

## 2023-04-13 LAB — I-STAT CG4 LACTIC ACID, ED
Lactic Acid, Venous: 1.3 mmol/L (ref 0.5–1.9)
Lactic Acid, Venous: 1.1 mmol/L (ref 0.5–1.9)

## 2023-04-13 MED ORDER — ACETAMINOPHEN 325 MG PO TABS
650.0000 mg | ORAL_TABLET | Freq: Four times a day (QID) | ORAL | Status: DC | PRN
  Administered 2023-04-15 – 2023-04-16 (×2): 650 mg via ORAL
  Filled 2023-04-13 (×2): qty 2

## 2023-04-13 MED ORDER — SODIUM CHLORIDE 0.9 % IV SOLN
2.0000 g | Freq: Two times a day (BID) | INTRAVENOUS | Status: DC
  Administered 2023-04-13: 2 g via INTRAVENOUS
  Filled 2023-04-13: qty 12.5

## 2023-04-13 MED ORDER — ACETAMINOPHEN 500 MG PO TABS
1000.0000 mg | ORAL_TABLET | Freq: Once | ORAL | Status: AC
  Administered 2023-04-13: 1000 mg via ORAL
  Filled 2023-04-13: qty 2

## 2023-04-13 MED ORDER — VANCOMYCIN HCL 2000 MG/400ML IV SOLN
2000.0000 mg | Freq: Once | INTRAVENOUS | Status: AC
  Administered 2023-04-13: 2000 mg via INTRAVENOUS
  Filled 2023-04-13: qty 400

## 2023-04-13 MED ORDER — NOREPINEPHRINE 4 MG/250ML-% IV SOLN
0.0000 ug/min | INTRAVENOUS | Status: DC
  Administered 2023-04-13: 2 ug/min via INTRAVENOUS
  Filled 2023-04-13: qty 250

## 2023-04-13 MED ORDER — AMPICILLIN SODIUM 2 G IJ SOLR
2.0000 g | Freq: Four times a day (QID) | INTRAMUSCULAR | Status: DC
  Administered 2023-04-14 – 2023-04-16 (×10): 2 g via INTRAVENOUS
  Filled 2023-04-13 (×11): qty 2000

## 2023-04-13 MED ORDER — PNEUMOCOCCAL 20-VAL CONJ VACC 0.5 ML IM SUSY
0.5000 mL | PREFILLED_SYRINGE | INTRAMUSCULAR | Status: AC | PRN
  Administered 2023-04-18: 0.5 mL via INTRAMUSCULAR
  Filled 2023-04-13: qty 0.5

## 2023-04-13 MED ORDER — METRONIDAZOLE 500 MG/100ML IV SOLN
500.0000 mg | Freq: Once | INTRAVENOUS | Status: AC
  Administered 2023-04-13: 500 mg via INTRAVENOUS
  Filled 2023-04-13: qty 100

## 2023-04-13 MED ORDER — CHLORHEXIDINE GLUCONATE CLOTH 2 % EX PADS
6.0000 | MEDICATED_PAD | Freq: Every day | CUTANEOUS | Status: DC
  Administered 2023-04-13 – 2023-04-16 (×4): 6 via TOPICAL

## 2023-04-13 MED ORDER — POLYETHYLENE GLYCOL 3350 17 G PO PACK: 17.0000 g | PACK | Freq: Every day | ORAL | Status: DC | PRN

## 2023-04-13 MED ORDER — SODIUM CHLORIDE 0.9 % IV SOLN
2.0000 g | Freq: Once | INTRAVENOUS | Status: AC
  Administered 2023-04-13: 2 g via INTRAVENOUS
  Filled 2023-04-13: qty 12.5

## 2023-04-13 MED ORDER — ROSUVASTATIN CALCIUM 10 MG PO TABS
10.0000 mg | ORAL_TABLET | Freq: Every day | ORAL | Status: DC
  Administered 2023-04-14 – 2023-04-18 (×5): 10 mg via ORAL
  Filled 2023-04-13 (×5): qty 1

## 2023-04-13 MED ORDER — SODIUM CHLORIDE 0.9 % IV BOLUS
1000.0000 mL | Freq: Once | INTRAVENOUS | Status: AC
  Administered 2023-04-13: 1000 mL via INTRAVENOUS

## 2023-04-13 MED ORDER — HEPARIN SODIUM (PORCINE) 5000 UNIT/ML IJ SOLN
5000.0000 [IU] | Freq: Three times a day (TID) | INTRAMUSCULAR | Status: DC
  Administered 2023-04-13: 5000 [IU] via SUBCUTANEOUS
  Filled 2023-04-13 (×2): qty 1

## 2023-04-13 MED ORDER — ASPIRIN 81 MG PO TBEC
81.0000 mg | DELAYED_RELEASE_TABLET | Freq: Every day | ORAL | Status: DC
  Administered 2023-04-14 – 2023-04-18 (×5): 81 mg via ORAL
  Filled 2023-04-13 (×5): qty 1

## 2023-04-13 MED ORDER — SODIUM CHLORIDE 0.9 % IV SOLN
250.0000 mL | INTRAVENOUS | Status: DC
Start: 2023-04-13 — End: 2023-04-14

## 2023-04-13 MED ORDER — LACTATED RINGERS IV SOLN: INTRAVENOUS | Status: AC

## 2023-04-13 MED ORDER — ONDANSETRON HCL 4 MG/2ML IJ SOLN
4.0000 mg | Freq: Four times a day (QID) | INTRAMUSCULAR | Status: DC | PRN
  Administered 2023-04-14: 4 mg via INTRAVENOUS
  Filled 2023-04-13: qty 2

## 2023-04-13 MED ORDER — NOREPINEPHRINE 4 MG/250ML-% IV SOLN
2.0000 ug/min | INTRAVENOUS | Status: DC
  Administered 2023-04-13: 2 ug/min via INTRAVENOUS

## 2023-04-13 MED ORDER — DOCUSATE SODIUM 100 MG PO CAPS: 100.0000 mg | ORAL_CAPSULE | Freq: Two times a day (BID) | ORAL | Status: DC | PRN

## 2023-04-13 MED ORDER — VANCOMYCIN HCL IN DEXTROSE 1-5 GM/200ML-% IV SOLN: 1000.0000 mg | Freq: Once | INTRAVENOUS | Status: DC

## 2023-04-13 NOTE — Progress Notes (Signed)
PHARMACY - PHYSICIAN COMMUNICATION CRITICAL VALUE ALERT - BLOOD CULTURE IDENTIFICATION (BCID)  Luis [name] is an 67 y.o. male who presented to [facility] on 04/13/2023 with sepsis.   Assessment: BCID + Enterococcus faecalis (no vanc resistance) in 4/4 bottles  Name of physician (or Provider) Contacted: Dr Warrick Parisian  Current antibiotics: Cefepime  Changes to prescribed antibiotics recommended:  Recommendations accepted by provider - d/c Cefepime - Begin Ampicillin 2gm IV q6h (dose adjusted for CrCl ~ 34 ml/min)  Results for orders placed or performed during the hospital encounter of 04/13/23  Blood Culture ID Panel (Reflexed) (Collected: 04/13/2023 10:24 AM)  Result Value Ref Range   Enterococcus faecalis DETECTED (A) NOT DETECTED   Enterococcus Faecium NOT DETECTED NOT DETECTED   Listeria monocytogenes NOT DETECTED NOT DETECTED   Staphylococcus species NOT DETECTED NOT DETECTED   Staphylococcus aureus (BCID) NOT DETECTED NOT DETECTED   Staphylococcus epidermidis NOT DETECTED NOT DETECTED   Staphylococcus lugdunensis NOT DETECTED NOT DETECTED   Streptococcus species NOT DETECTED NOT DETECTED   Streptococcus agalactiae NOT DETECTED NOT DETECTED   Streptococcus pneumoniae NOT DETECTED NOT DETECTED   Streptococcus pyogenes NOT DETECTED NOT DETECTED   A.calcoaceticus-baumannii NOT DETECTED NOT DETECTED   Bacteroides fragilis NOT DETECTED NOT DETECTED   Enterobacterales NOT DETECTED NOT DETECTED   Enterobacter cloacae complex NOT DETECTED NOT DETECTED   Escherichia coli NOT DETECTED NOT DETECTED   Klebsiella aerogenes NOT DETECTED NOT DETECTED   Klebsiella oxytoca NOT DETECTED NOT DETECTED   Klebsiella pneumoniae NOT DETECTED NOT DETECTED   Proteus species NOT DETECTED NOT DETECTED   Salmonella species NOT DETECTED NOT DETECTED   Serratia marcescens NOT DETECTED NOT DETECTED   Haemophilus influenzae NOT DETECTED NOT DETECTED   Neisseria meningitidis NOT DETECTED NOT DETECTED    Pseudomonas aeruginosa NOT DETECTED NOT DETECTED   Stenotrophomonas maltophilia NOT DETECTED NOT DETECTED   Candida albicans NOT DETECTED NOT DETECTED   Candida auris NOT DETECTED NOT DETECTED   Candida glabrata NOT DETECTED NOT DETECTED   Candida krusei NOT DETECTED NOT DETECTED   Candida parapsilosis NOT DETECTED NOT DETECTED   Candida tropicalis NOT DETECTED NOT DETECTED   Cryptococcus neoformans/gattii NOT DETECTED NOT DETECTED   Vancomycin resistance NOT DETECTED NOT DETECTED    Maryellen Pile, PharmD 04/13/2023  11:32 PM

## 2023-04-13 NOTE — Progress Notes (Signed)
Elink monitoring code sepsis 

## 2023-04-13 NOTE — Progress Notes (Deleted)
I personally saw the patient and performed a substantive portion of this encounter, including a complete performance of at least one of the key components (MDM, Hx and/or Exam), in conjunction with the Advanced Practice Provider.    Metastatic bladder cancer status post cystoprostatectomy 11/8 with ileal conduit  Did well initially but has been having vomiting with diarrhea for the last 2 weeks, 20 pound weight loss came in for generalized weakness and low-grade fevers 100.5 in the ED Hypotensive, lactate normal, mild leukocytosis, AKI with BUN/creatinine 33/2.4, UA with more than 50 white cells CT abdomen surprisingly does not show any bowel pathology no evidence of hydronephrosis Received 2 L of fluid and started on low-dose Levophed Reviewed recent cardiology visit, on diuretics and Coreg   On exam -awake, interactive, lethargic, soft nontender abdomen, clear urine with some mucus strands, clear breath sounds bilateral, S1-S2 bradycardic 50s on monitor sinus rhythm.   Impression/plan Hypovolemic versus septic shock -await cultures, empiric cefepime, give more fluids and attempt to taper off Levophed, admit to ICU. Will treat as urinary source for now while awaiting cultures No evidence of bowel obstruction on CT abdomen, difficult to interpret UA due to ileal conduit , seen by urology  AKI -follow renal function, nonoliguric currently. HFpEF -hold Coreg and diuretic for now  Wife updated at bedside. My independent critical care time was 40 minutes  Debralee Braaksma V. Vassie Loll MD

## 2023-04-13 NOTE — Telephone Encounter (Signed)
Received call from pt's wife. She informed us that her husband is in the ED currently being worked for an infection s/p cystectomy, likely possible admission,  Patient Education has been cancelled for the day.and rescheduled 04/17/23 if pt is out of the hospital.

## 2023-04-13 NOTE — Progress Notes (Signed)
   04/13/23 2321  BiPAP/CPAP/SIPAP  Reason BIPAP/CPAP not in use Non-compliant   Pt. found on Room Air, stated, "will have wife bring his in from home", made aware to notify if wanting one placed prior.

## 2023-04-13 NOTE — Progress Notes (Signed)
Pharmacy Antibiotic Note  Luis [name] is a 67 y.o. male admitted on 04/13/2023 with sepsis. Pharmacy has been consulted for cefepime dosing.  Hx of bladder cancer s/p TURBT and pembrolizumab x8 cycles. Cancer now spread to lymph nodes and planned to start gemcitabine and cisplatin after Christmas. Pt initially normotensive in ED with fever of 100.5, WBC 19.6, LA 1.3. Patient given one-time doses of cefepime, Flagyl, and vanc while in the ED 12/19. Scr 2.42 on admission, CrCl ~34 ml/min.   Plan: Start cefepime 2g IV q12hrs  Monitor cultures, renal function, and overall clinical picture  De-escalate abx as able  Height: 5\' 6"  (167.6 cm) Weight: 117.9 kg (260 lb) IBW/kg (Calculated) : 63.8  Temp (24hrs), Avg:99.4 F (37.4 C), Min:98.3 F (36.8 C), Max:100.5 F (38.1 C)  Recent Labs  Lab 04/13/23 1024 04/13/23 1032 04/13/23 1313  WBC 19.6*  --   --   CREATININE 2.42*  --   --   LATICACIDVEN  --  1.3 1.1    Estimated Creatinine Clearance: 34.8 mL/min (A) (by C-G formula based on SCr of 2.42 mg/dL (H)).    No Known Allergies  Antimicrobials this admission: 12/19 vancomycin 2g IV x1 12/19 Flagyl 500mg  IV x1 12/19 cefepime >>  Dose adjustments this admission: N/A  Microbiology results: 12/19 BCx: in process 12/19 UCx: in process  12/19 MRSA PCR: not yet collected   Thank you for allowing pharmacy to be a part of this patient's care.  Cherylin Mylar, PharmD Clinical Pharmacist  12/19/20245:17 PM

## 2023-04-13 NOTE — ED Provider Notes (Signed)
[place] EMERGENCY DEPARTMENT AT [facility] Provider Note   CSN: [id number] Arrival date & time: 04/13/23  0930     History  Chief Complaint  Patient presents with   Weakness    Luis [name] is a 67 y.o. male.  With a history of bladder cancer status post total cystectomy last month and history of heart failure and MI presents to the ED for generalized weakness.  Patient underwent total cystectomy on November 8 with Dr. [name] and has been recovering at home.  Wife voices concern for generalized weakness, decreased p.o. intake, 30 pound weight loss since the procedure.  He is scheduled to start another round of chemotherapy.  Wife first noticed fever last night which persisted to today.  He was seen as an outpatient in his urology office upstairs and sent here for further evaluation given concern for potential postoperative infection   Weakness      Home Medications Prior to Admission medications   Medication Sig Start Date End Date Taking? Authorizing Provider  acetaminophen (TYLENOL) 325 MG tablet Take 650 mg by mouth every 6 (six) hours as needed (pain.).    [provider]  aspirin EC 81 MG tablet Take 81 mg by mouth in the morning.    [provider]  carvedilol (COREG) 3.125 MG tablet TAKE 1 TABLET BY MOUTH TWICE A DAY WITH FOOD 04/07/23   Laurey Morale, MD  diazepam (VALIUM) 5 MG tablet TAKE 1 TABLET BY MOUTH EVERY 12 HOURS AS NEEDED FOR ANXIETY. 03/29/23   Nelwyn Salisbury, MD  Ferrous Sulfate (IRON PO) Take 1 tablet by mouth in the morning. 1 tab daily    [provider]  furosemide (LASIX) 20 MG tablet Take 2 tablets (40 mg total) by mouth 2 (two) times daily. 04/05/23   Lee, [place], NP  ipratropium (ATROVENT) 0.03 % nasal spray Place 2 sprays into both nostrils every 12 (twelve) hours as needed for rhinitis.    [provider]  Magnesium 500 MG TABS Take 500 mg by mouth in the morning.    [provider]   meclizine (ANTIVERT) 25 MG tablet Take 1 tablet (25 mg total) by mouth 3 (three) times daily as needed for dizziness. 09/09/22   Nelwyn Salisbury, MD  multivitamin-iron-minerals-folic acid (CENTRUM) chewable tablet Chew 1 tablet by mouth daily. 09/23/21   Barrett, Erin R, PA-C  nitroGLYCERIN (NITROSTAT) 0.4 MG SL tablet Place 0.4 mg under the tongue every 5 (five) minutes as needed for chest pain. 11/03/17 12/05/25  [provider]  oxyCODONE (ROXICODONE) 5 MG immediate release tablet Take 1 tablet (5 mg total) by mouth every 6 (six) hours as needed for breakthrough pain (post-operatively). 03/09/23 03/08/24  Loletta Parish., MD  potassium chloride SA (KLOR-CON M20) 20 MEQ tablet Take 2 tablets (40 mEq total) by mouth daily. 04/05/23   Laurey Morale, MD  rosuvastatin (CRESTOR) 20 MG tablet TAKE 1 TABLET BY MOUTH EVERYDAY AT BEDTIME 02/06/23   Milford, Anderson Malta, FNP  venlafaxine XR (EFFEXOR-XR) 150 MG 24 hr capsule TAKE 1 CAPSULE BY MOUTH DAILY WITH BREAKFAST. 02/07/23   Nelwyn Salisbury, MD      Allergies    Patient has no known allergies.    Review of Systems   Review of Systems  Neurological:  Positive for weakness.    Physical Exam Updated Vital Signs BP (!) 93/55   Pulse (!) 58   Temp 98.3 F (36.8 C) (Oral)  Resp (!) 21   Ht 5\' 6"  (1.676 m)   Wt 117.9 kg   SpO2 98%   BMI 41.97 kg/m  Physical Exam Vitals and nursing note reviewed.  HENT:     Head: Normocephalic and atraumatic.  Eyes:     Pupils: Pupils are equal, round, and reactive to light.  Cardiovascular:     Rate and Rhythm: Normal rate and regular rhythm.  Pulmonary:     Effort: Pulmonary effort is normal.     Breath sounds: Normal breath sounds.  Abdominal:     Palpations: Abdomen is soft.     Tenderness: There is no abdominal tenderness.     Comments: Healing postsurgical incision Nephrostomy tube in place with some white mucus appearing drainage from insertion site Draining clear yellow urine in  collection bag No surrounding induration erythema No abdominal tenderness No rebound rigidity or guarding  Skin:    General: Skin is warm and dry.  Neurological:     Mental Status: He is alert.  Psychiatric:        Mood and Affect: Mood normal.     ED Results / Procedures / Treatments   Labs (all labs ordered are listed, but only abnormal results are displayed) Labs Reviewed  COMPREHENSIVE METABOLIC PANEL - Abnormal; Notable for the following components:      Result Value   Sodium 132 (*)    CO2 19 (*)    Glucose, Bld 150 (*)    BUN 33 (*)    Creatinine, Ser 2.42 (*)    Calcium 8.8 (*)    Total Protein 8.9 (*)    Albumin 3.2 (*)    Alkaline Phosphatase 136 (*)    GFR, Estimated 28 (*)    All other components within normal limits  CBC WITH DIFFERENTIAL/PLATELET - Abnormal; Notable for the following components:   WBC 19.6 (*)    RBC 3.70 (*)    Hemoglobin 9.8 (*)    HCT 31.8 (*)    RDW 15.7 (*)    Neutro Abs 17.7 (*)    Abs Immature Granulocytes 0.13 (*)    All other components within normal limits  PROTIME-INR - Abnormal; Notable for the following components:   Prothrombin Time 16.1 (*)    INR 1.3 (*)    All other components within normal limits  APTT - Abnormal; Notable for the following components:   aPTT 38 (*)    All other components within normal limits  URINALYSIS, W/ REFLEX TO CULTURE (INFECTION SUSPECTED) - Abnormal; Notable for the following components:   APPearance CLOUDY (*)    Hgb urine dipstick MODERATE (*)    Protein, ur 100 (*)    Leukocytes,Ua LARGE (*)    Bacteria, UA FEW (*)    All other components within normal limits  RESP PANEL BY RT-PCR (RSV, FLU A&B, COVID)  RVPGX2  CULTURE, BLOOD (ROUTINE X 2)  CULTURE, BLOOD (ROUTINE X 2)  URINE CULTURE  I-STAT CG4 LACTIC ACID, ED  I-STAT CG4 LACTIC ACID, ED    EKG EKG Interpretation Date/Time:  Thursday April 13 2023 10:27:35 EST Ventricular Rate:  81 PR Interval:  174 QRS  Duration:  117 QT Interval:  375 QTC Calculation: 436 R Axis:   82  Text Interpretation: Sinus rhythm Nonspecific intraventricular conduction delay Borderline T abnormalities, inferior leads Confirmed by Estelle June [phone]) on 04/13/2023 3:51:26 PM  Radiology CT ABDOMEN PELVIS WO CONTRAST Result Date: 04/13/2023 CLINICAL DATA:  Status post cystectomy on 03/03/2023 with pain and  fever, initial encounter EXAM: CT ABDOMEN AND PELVIS WITHOUT CONTRAST TECHNIQUE: Multidetector CT imaging of the abdomen and pelvis was performed following the standard protocol without IV contrast. RADIATION DOSE REDUCTION: This exam was performed according to the departmental dose-optimization program which includes automated exposure control, adjustment of the mA and/or kV according to patient size and/or use of iterative reconstruction technique. COMPARISON:  12/21/2022 FINDINGS: Lower chest: No acute abnormality. Hepatobiliary: Fatty infiltration of the liver is noted. Gallbladder has been surgically removed. Pancreas: Unremarkable. No pancreatic ductal dilatation or surrounding inflammatory changes. Spleen: Normal in size without focal abnormality. Adrenals/Urinary Tract: Adrenal glands are within normal limits. Kidneys demonstrate no renal calculi. Small exophytic cysts are noted in left kidney stable from the prior exam. No further follow-up is recommended. Mild fullness of the collecting systems is noted bilaterally. A ureteral stent is noted on the right. Interval cystectomy is noted with urostomy in the right lower quadrant. The ureteral stent on the right extends into the ostomy bag. Stomach/Bowel: Stomach is within normal limits. Small bowel shows no obstructive change. Scattered fluid and fecal material is noted throughout the colon. No obstructive changes are seen. Postsurgical changes in the right lower quadrant are noted consistent with creation of an ileal conduit Vascular/Lymphatic: Aortic atherosclerosis. No  enlarged abdominal or pelvic lymph nodes. Reproductive: Prostate has been surgically removed during the cystectomy procedure. No fluid is noted in the operative bed. Other: No free fluid is noted.  No focal herniation is noted. Musculoskeletal: No acute or significant osseous findings. IMPRESSION: Changes consistent with history of prior cystectomy and prostatectomy with ileal conduit formation in the right mid abdomen. Mild fullness of the collecting systems is noted bilaterally. A right-sided nephroureteral stent is noted extending into the ostomy bag. No evidence of postoperative abscess or extravasation is identified. No acute abnormality is seen. Electronically Signed   By: Alcide Clever M.D.   On: 04/13/2023 15:32   DG Chest Port 1 View Result Date: 04/13/2023 CLINICAL DATA:  Possible sepsis EXAM: PORTABLE CHEST 1 VIEW COMPARISON:  Chest x-ray October 20, 2021 FINDINGS: The cardiomediastinal silhouette is unchanged in contour status post median sternotomy. No focal pulmonary opacity. No pleural effusion or pneumothorax. The visualized upper abdomen is unremarkable. No acute osseous abnormality. IMPRESSION: No active disease. Electronically Signed   By: Jacob Moores M.D.   On: 04/13/2023 14:09    Procedures .Critical Care  Performed by: Royanne Foots, DO Authorized by: Royanne Foots, DO   Critical care provider statement:    Critical care time (minutes):  45   Critical care time was exclusive of:  Separately billable procedures and treating other patients and teaching time   Critical care was necessary to treat or prevent imminent or life-threatening deterioration of the following conditions:  Sepsis   Critical care was time spent personally by me on the following activities:  Development of treatment plan with patient or surrogate, discussions with consultants, evaluation of patient's response to treatment, examination of patient, ordering and review of laboratory studies, ordering and  review of radiographic studies, ordering and performing treatments and interventions, pulse oximetry, re-evaluation of patient's condition and review of old charts   I assumed direction of critical care for this patient from another provider in my specialty: no   Comments:     Discussed with intensivist Dr. Vassie Loll     Medications Ordered in ED Medications  lactated ringers infusion ( Intravenous New Bag/Given 04/13/23 1128)  norepinephrine (LEVOPHED) 4mg  in (0.016 mg/mL)  premix infusion (2 mcg/min Intravenous New Bag/Given 04/13/23 1537)  ceFEPIme (MAXIPIME) 2 g in sodium chloride 0.9 % 100 mL IVPB (0 g Intravenous Stopped 04/13/23 1140)  metroNIDAZOLE (FLAGYL) IVPB 500 mg (0 mg Intravenous Stopped 04/13/23 1215)  sodium chloride 0.9 % bolus 1,000 mL (0 mLs Intravenous Stopped 04/13/23 1130)  vancomycin (VANCOREADY) IVPB 2000 mg/400 mL (0 mg Intravenous Stopped 04/13/23 1354)  acetaminophen (TYLENOL) tablet 1,000 mg (1,000 mg Oral Given 04/13/23 1127)  sodium chloride 0.9 % bolus 1,000 mL (0 mLs Intravenous Stopped 04/13/23 1409)    ED Course/ Medical Decision Making/ A&P Clinical Course as of 04/13/23 1551  Thu Apr 13, 2023  1427 Patient has had nearly 30 cc/kg IV fluid bolus up to this point.  Systolic blood pressure in the low 100s but maintaining MAP above 70.  Feels a bit better after fever control here.  Labs notable for leukocytosis of 19.6 with infected urine which we will send for culture.  COVID RSV flu all negative.  Renal impairment at baseline with no significant metabolic disturbance.  Lactate is 1.1.  CT abdomen pelvis has been obtained but we are awaiting formal radiology read.  I discussed with urologist PA on-call who will evaluate patient in the emergency department shortly.  Planning for admission once CT read is finalized [MP]  1523 Reassessed patient hemodynamic status after volume resuscitation.  MAP is continuously downtrending.  Will initiate Levophed at this time  [MP]  1538 CT abdomen pelvis radiology read completed.  Shows postoperative changes but no acute abscess, extravasation or other acute process.  Discussed with intensivist on-call who will evaluate patient in the ED to determine best disposition [MP]  1548 I, Estelle June DO, am transitioning care of this patient to the oncoming provider pending ICU evaluation and admission to medicine service versus ICU [MP]    Clinical Course User Index [MP] Royanne Foots, DO                                 Medical Decision Making 67 year old male with history as above including bladder cancer with recent cystectomy on 11 8 presenting for generalized weakness, poor p.o. intake, and fever from home.  Was seen earlier this morning at urology office and directed here for further evaluation given concern for postoperative infection.  Febrile with oral temp of 100.5 on initial assessment here.  Not meeting SIRS criteria but will treat for presumed infection, will obtain infectious workup including labs, blood cultures, lactate, UA, chest x-ray and initiate broad-spectrum antibiotics and IV fluids.  Will obtain CT abdomen pelvis to look for evidence of postoperative infection or complication following recent cystectomy.  Hemodynamically stable we will continue to monitor on telemetry and reassess hemodynamic status.  Admission likely  Amount and/or Complexity of Data Reviewed Radiology: ordered.  Risk OTC drugs. Prescription drug management.           Final Clinical Impression(s) / ED Diagnoses Final diagnoses:  AKI (acute kidney injury) (HCC)  History of bladder cancer  Sepsis, due to unspecified organism, unspecified whether acute organ dysfunction present [facility])    Rx / DC Orders ED Discharge Orders     None         Royanne Foots, DO 04/13/23 1552

## 2023-04-13 NOTE — ED Provider Triage Note (Signed)
Emergency Medicine Provider Triage Evaluation Note  Luis [name] , a 67 y.o. male  was evaluated in triage.  Pt complains of generalized weakness.  Has been going on for couple weeks.  Family member mentioned that he has had a temperature for 3 to 4 days.  Also endorsing generalized abdominal pain.  History of bladder cancer status post surgery on November 8.  Has indwelling Foley catheter.  Review of Systems  Positive: See above Negative: See above  Physical Exam  BP 124/73 (BP Location: Right Arm)   Pulse 76   Temp (!) 100.5 F (38.1 C) (Oral) Comment: 1G tylenol @ 07:30  Resp 18   Ht 5\' 6"  (1.676 m)   Wt 117.9 kg   SpO2 92%   BMI 41.97 kg/m  Gen:   Awake, no distress   Resp:  Normal effort  MSK:   Moves extremities without difficulty  Other:    Medical Decision Making  Medically screening exam initiated at 10:15 AM.  Appropriate orders placed.  Luis [name] was informed that the remainder of the evaluation will be completed by another provider, this initial triage assessment does not replace that evaluation, and the importance of remaining in the ED until their evaluation is complete.  Febrile in triage and is ill-appearing.  Activated code sepsis.   [name], PA-C 04/13/23 1016

## 2023-04-13 NOTE — H&P (Signed)
NAME:  Luis [name], MRN:  [id number], DOB:  07-23-1955, LOS: 0 ADMISSION DATE:  04/13/2023, CONSULTATION DATE:  04/13/23 REFERRING MD:  Dr. Elayne Snare, CHIEF COMPLAINT:  weakness   History of Present Illness:  53 yoM with PMH as below significant for bladder cancer (dx 2020 s/p TURBT and completion of 8 cycles of pembrolizumab (03/03/23) who was sent from urology office today with concern for sepsis.  Followed by Dr. [name] with oncology, scheduled to Tentatively scheduled to start gemcitabine and cisplatin after Christmas given positive margins and spread to lymph nodes next week.  Pt presenting from home with 2-3 weeks of progressive generalized weakness, decreased PO intake, and 28lb weight loss since under going total cystectomy with ileal conduit and prostatectomy Nov 8 w/ Dr. [name].  Wife reports they noticed an odor from ileal conduit when symptoms started.  Was eating normally but since gags at the smell of food with occasional non-bloody emesis. Also has complained of changing abd pain and been taking Pepto-bismo frequently.  No change in urine output.  Noticed low grade fever, tmax 100.4 and sleeping a lot, compliant with CPAP, no AMS, for several days but pt resistant to seek evaluation.  Pt denies HA, neck pain, CP, SOB, or diarrhea/ constipation.    In ER, initially normotensive with temp 100.5.   Labs Na 132, bicarb 19, BUN/ sCr 33/ 2.42 (previously 17/ 1.53 on 12/6), alk phos 136, WBC 19.6, Hgb 9.8 (previously 10.4), reassuring lactic 1.3> 1.1, INR 1.3, UA with mod Hgb, large leukocytes, > 50 WBC, 100 protein.  CXR without acute process.  CT a/p showed no evidence of postoperative abscess or extravasation or acute abnormality; existing right sided nephroureteral stent well positioned.  Urology consulting.  Pan-cultured and started on empiric vanc, cefepime, and flagyl.  1L NS given.  Pt developed SBP in the 80's, improved with additional 1L NS bolus but requiring low dose peripheral NE.  PCCM  consulted for admit.   Last took his medicines 12/18, including diuretics and coreg.  Taking tylenol only for pain.   Pertinent  Medical History  CAD, HFpEF, mod AS (biscuspid AV s/p AV replacement and replaced of ascending aorta 5/23), metastatic bladder cancer, OSA on CPAP, HTN, PAF off DOAC  Significant Hospital Events: Including procedures, antibiotic start and stop dates in addition to other pertinent events     Interim History / Subjective:   Objective   Blood pressure (!) 93/55, pulse (!) 58, temperature 98.3 F (36.8 C), temperature source Oral, resp. rate (!) 21, height 5\' 6"  (1.676 m), weight 117.9 kg, SpO2 98%.        Intake/Output Summary (Last 24 hours) at 04/13/2023 1542 Last data filed at 04/13/2023 1409 Gross per 24 hour  Intake 2400 ml  Output --  Net 2400 ml   Filed Weights   04/13/23 0951  Weight: 117.9 kg    Examination: General:  Older male lying in bed in NAD, initially sleeping  HEENT: MM pink/minimally moist, poor dentition Neuro: awakens easily, oriented x 3, MAE, generalized weakness CV: rr, SB 50's, no murmur PULM:  non labored, clear, diminished in bases, 99% 2L GI: obese, soft, bs+, NT, ileal conduit in place with some white mucous output, cyu Extremities: warm/dry, trace ankle edema  Skin: no rashes   Resolved Hospital Problem list    Assessment & Plan:   Shock- suspect hypovolemic given poor PO intake, N/V,  rule out urosepsis - CXR neg, CT a/p without acute process P:  -  admit to ICU - cont to wean low dose peripheral NE for MAP goal > 65 - cont cefepime, stop vanc/ flagyl - neg lactic acid reassuring - follow cultures - trend WBC/ fever curve - CBGs q 4, add SSI if needed - antiemetics prn.  Sips for now, advance as tolerated - cont to supportive care, mental status reassuring at this time   AKI, suspect pre-renal with poor PO intake, diuretic use  NAGMA, mild Hyponatremia - good UOP thus far - cont supportive care -  trend renal indices  - strict I/Os, daily wts - avoid nephrotoxins, renal dose meds, hemodynamic support as above   Hx HTN, HFpEF - NSR to bradycardic in 50's - given hypotension, cont to hold home coreg, lasix   High risk for Protein calorie malnutrition - 28lb wt loss since 11/8 - maximize nutrition when able   OSA on CPAP - CPAP nightly    Metastatic Bladder cancer s/p total cystectomy with ileal conduit and prostatectomy Nov 8 w/ Dr. [name] w/urology - Urology following, appreciate input - followed by Dr. [name], f/u oncology outpt;  tentative plans to start gemcitabine and cisplatin soon   Depression/ anxiety - hold effexor- XR with AKI for now   HLD - resume crestor  Best Practice (right click and "Reselect all SmartList Selections" daily)   Diet/type: NPO w/ oral meds, advance as tolerated DVT prophylaxis prophylactic heparin  Pressure ulcer(s): N/A GI prophylaxis: N/A Lines: N/A Foley:  N/A Code Status:  full code Last date of multidisciplinary goals of care discussion [12/19]  Wife, Claris Gladden updated at bedside, [phone]  Labs   CBC: Recent Labs  Lab 04/13/23 1024  WBC 19.6*  NEUTROABS 17.7*  HGB 9.8*  HCT 31.8*  MCV 85.9  PLT 260    Basic Metabolic Panel: Recent Labs  Lab 04/13/23 1024  NA 132*  K 4.2  CL 99  CO2 19*  GLUCOSE 150*  BUN 33*  CREATININE 2.42*  CALCIUM 8.8*   GFR: Estimated Creatinine Clearance: 34.8 mL/min (A) (by C-G formula based on SCr of 2.42 mg/dL (H)). Recent Labs  Lab 04/13/23 1024 04/13/23 1032 04/13/23 1313  WBC 19.6*  --   --   LATICACIDVEN  --  1.3 1.1    Liver Function Tests: Recent Labs  Lab 04/13/23 1024  AST 36  ALT 32  ALKPHOS 136*  BILITOT 1.0  PROT 8.9*  ALBUMIN 3.2*   No results for input(s): "LIPASE", "AMYLASE" in the last 168 hours. No results for input(s): "AMMONIA" in the last 168 hours.  ABG    Component Value Date/Time   PHART 7.323 (L) 03/03/2023 1144   PCO2ART 43.9  03/03/2023 1144   PO2ART 143 (H) 03/03/2023 1144   HCO3 22.8 03/03/2023 1144   TCO2 24 03/03/2023 1144   ACIDBASEDEF 3.0 (H) 03/03/2023 1144   O2SAT 99 03/03/2023 1144     Coagulation Profile: Recent Labs  Lab 04/13/23 1024  INR 1.3*    Cardiac Enzymes: No results for input(s): "CKTOTAL", "CKMB", "CKMBINDEX", "TROPONINI" in the last 168 hours.  HbA1C: Hgb A1c MFr Bld  Date/Time Value Ref Range Status  05/30/2022 12:52 PM 5.9 (H) 4.8 - 5.6 % Final    Comment:    (NOTE) Pre diabetes:          5.7%-6.4%  Diabetes:              >6.4%  Glycemic control for   <7.0% adults with diabetes   09/14/2021 11:33 AM 5.9 (H)  4.8 - 5.6 % Final    Comment:    (NOTE) Pre diabetes:          5.7%-6.4%  Diabetes:              >6.4%  Glycemic control for   <7.0% adults with diabetes     CBG: No results for input(s): "GLUCAP" in the last 168 hours.  Review of Systems:   Review of Systems  Constitutional:  Positive for fever, malaise/fatigue and weight loss.  Respiratory:  Negative for cough and shortness of breath.   Cardiovascular:  Negative for chest pain, orthopnea and leg swelling.  Gastrointestinal:  Positive for abdominal pain, nausea and vomiting. Negative for blood in stool, constipation, diarrhea and melena.  Genitourinary:  Negative for flank pain and hematuria.  Neurological:  Positive for weakness. Negative for dizziness, focal weakness, loss of consciousness and headaches.   Past Medical History:  He,  has a past medical history of Anxiety, Aortic stenosis, moderate, Arthritis, Ascending aortic aneurysm (HCC), CAD (coronary artery disease), Cancer (HCC), Carotid stenosis, Chronic diastolic CHF (congestive heart failure) (HCC), Complication of anesthesia, Depression, Dyspnea, Essential hypertension, GERD (gastroesophageal reflux disease), Heart murmur, History of colonic polyps, Hyperlipidemia, Joint pain, Lesion of bladder, Myocardial infarction (HCC) (2012), Obesity (BMI  30-39.9) (02/29/2016), Pre-diabetes, Restless leg, Sleep apnea, Tubular adenoma of colon, and Vertigo.   Surgical History:   Past Surgical History:  Procedure Laterality Date   AORTIC VALVE REPLACEMENT N/A 09/16/2021   Procedure: AORTIC VALVE REPLACEMENT (AVR);  Surgeon: Alleen Borne, MD;  Location: [facility];  Service: Open Heart Surgery;  Laterality: N/A;   CATARACT EXTRACTION   4 YRS AGO   BOTH EYES   CHOLECYSTECTOMY  07/21/2011   Procedure: LAPAROSCOPIC CHOLECYSTECTOMY WITH INTRAOPERATIVE CHOLANGIOGRAM;  Surgeon: Kandis Cocking, MD;  Location: [facility];  Service: General;  Laterality: N/A;   CORONARY ANGIOPLASTY  2012   2 stents   coronary stenting     s/p PCI of the RCA by Dr Excell Seltzer 8/12 with 2 promus stents   CYSTOSCOPY W/ RETROGRADES Bilateral 12/13/2019   Procedure: CYSTOSCOPY WITH RETROGRADE PYELOGRAM;  Surgeon: Sebastian Ache, MD;  Location: [facility];  Service: Urology;  Laterality: Bilateral;   CYSTOSCOPY W/ RETROGRADES Bilateral 10/14/2020   Procedure: CYSTOSCOPY WITH RETROGRADE PYELOGRAM;  Surgeon: Sebastian Ache, MD;  Location: [facility];  Service: Urology;  Laterality: Bilateral;   CYSTOSCOPY W/ RETROGRADES Bilateral 12/16/2020   Procedure: CYSTOSCOPY WITH RETROGRADE PYELOGRAM;  Surgeon: Sebastian Ache, MD;  Location: [facility];  Service: Urology;  Laterality: Bilateral;   CYSTOSCOPY W/ RETROGRADES Bilateral 06/03/2022   Procedure: CYSTOSCOPY WITH RETROGRADE PYELOGRAM;  Surgeon: Sebastian Ache, MD;  Location: [facility];  Service: Urology;  Laterality: Bilateral;   CYSTOSCOPY W/ RETROGRADES Bilateral 12/28/2022   Procedure: CYSTOSCOPY WITH RETROGRADE PYELOGRAM, FULGARATION OF BLEEDERS;  Surgeon: Loletta Parish., MD;  Location: [facility];  Service: Urology;  Laterality: Bilateral;   CYSTOSCOPY WITH INJECTION N/A 03/03/2023   Procedure: CYSTOSCOPY WITH INDOCYANINE INJECTION;  Surgeon: Loletta Parish., MD;  Location: [facility];   Service: Urology;  Laterality: N/A;  360 MINUTES   LEFT AND RIGHT HEART CATHETERIZATION WITH CORONARY ANGIOGRAM N/A 06/23/2014   Procedure: LEFT AND RIGHT HEART CATHETERIZATION WITH CORONARY ANGIOGRAM;  Surgeon: Laurey Morale, MD;  Location: [facility] CATH LAB;  Service: Cardiovascular;  Laterality: N/A;   NECK SURGERY  03/23/09   per Dr. Ophelia Charter, cervical fusion    PERICARDIOCENTESIS N/A 09/28/2021  Procedure: PERICARDIOCENTESIS;  Surgeon: Corky Crafts, MD;  Location: [facility] INVASIVE CV LAB;  Service: Cardiovascular;  Laterality: N/A;   REPLACEMENT ASCENDING AORTA N/A 09/16/2021   Procedure: REPLACEMENT ASCENDING AORTA WITH 30 X HEMASHIELD PLATINUM WOVEN DOUBLE VELOUR VASCULAR GRAFT;  Surgeon: Alleen Borne, MD;  Location: [facility] OR;  Service: Open Heart Surgery;  Laterality: N/A;  CIRC ARREST   right elbow surgery     RIGHT HEART CATH AND CORONARY ANGIOGRAPHY N/A 07/08/2021   Procedure: RIGHT HEART CATH AND CORONARY ANGIOGRAPHY;  Surgeon: Laurey Morale, MD;  Location: [facility] INVASIVE CV LAB;  Service: Cardiovascular;  Laterality: N/A;   ROBOT ASSISTED LAPAROSCOPIC COMPLETE CYSTECT ILEAL CONDUIT N/A 03/03/2023   Procedure: XI ROBOTIC ASSISTED LAPAROSCOPIC COMPLETE CYSTECTECTOMY WITH  ILEAL CONDUIT DIVERSION;  Surgeon: Loletta Parish., MD;  Location: [facility];  Service: Urology;  Laterality: N/A;   ROBOT ASSISTED LAPAROSCOPIC RADICAL PROSTATECTOMY N/A 03/03/2023   Procedure: XI ROBOTIC ASSISTED LAPAROSCOPIC RADICAL PROSTATECTOMY WITH LYMPH NODE DISSECTION;  Surgeon: Loletta Parish., MD;  Location: [facility];  Service: Urology;  Laterality: N/A;   solonscopy  05/23/08   per Dr. Celene Kras hemorrhoids only, repeat in 5 years   SUBXYPHOID PERICARDIAL WINDOW N/A 09/28/2021   Procedure: SUBXYPHOID PERICARDIAL WINDOW;  Surgeon: Alleen Borne, MD;  Location: [facility] OR;  Service: Thoracic;  Laterality: N/A;   TEE WITHOUT CARDIOVERSION N/A 06/23/2014   Procedure: TRANSESOPHAGEAL ECHOCARDIOGRAM (TEE);   Surgeon: Laurey Morale, MD;  Location: [facility] ENDOSCOPY;  Service: Cardiovascular;  Laterality: N/A;   TEE WITHOUT CARDIOVERSION N/A 01/21/2016   Procedure: TRANSESOPHAGEAL ECHOCARDIOGRAM (TEE);  Surgeon: Laurey Morale, MD;  Location: [facility] ENDOSCOPY;  Service: Cardiovascular;  Laterality: N/A;   TEE WITHOUT CARDIOVERSION N/A 09/16/2021   Procedure: TRANSESOPHAGEAL ECHOCARDIOGRAM (TEE);  Surgeon: Alleen Borne, MD;  Location: [facility] OR;  Service: Open Heart Surgery;  Laterality: N/A;   TEE WITHOUT CARDIOVERSION N/A 09/28/2021   Procedure: TRANSESOPHAGEAL ECHOCARDIOGRAM (TEE);  Surgeon: Alleen Borne, MD;  Location: [facility] OR;  Service: Thoracic;  Laterality: N/A;   TONSILLECTOMY     TRANSURETHRAL RESECTION OF BLADDER TUMOR N/A 10/21/2019   Procedure: TRANSURETHRAL RESECTION OF BLADDER TUMOR (TURBT);  Surgeon: Ihor Gully, MD;  Location: [facility];  Service: Urology;  Laterality: N/A;   TRANSURETHRAL RESECTION OF BLADDER TUMOR N/A 12/13/2019   Procedure: TRANSURETHRAL RESECTION OF BLADDER TUMOR (TURBT);  Surgeon: Sebastian Ache, MD;  Location: [facility];  Service: Urology;  Laterality: N/A;  1 HR   TRANSURETHRAL RESECTION OF BLADDER TUMOR N/A 10/14/2020   Procedure: TRANSURETHRAL RESECTION OF BLADDER TUMOR (TURBT);  Surgeon: Sebastian Ache, MD;  Location: [facility];  Service: Urology;  Laterality: N/A;   TRANSURETHRAL RESECTION OF BLADDER TUMOR N/A 12/16/2020   Procedure: RESTAGING TRANSURETHRAL RESECTION OF BLADDER TUMOR (TURBT);  Surgeon: Sebastian Ache, MD;  Location: [facility];  Service: Urology;  Laterality: N/A;   TRANSURETHRAL RESECTION OF BLADDER TUMOR N/A 06/03/2022   Procedure: TRANSURETHRAL RESECTION OF BLADDER TUMOR (TURBT);  Surgeon: Sebastian Ache, MD;  Location: [facility];  Service: Urology;  Laterality: N/A;   UMBILICAL HERNIA REPAIR  03/03/2023   Procedure: HERNIA REPAIR UMBILICAL;  Surgeon: Loletta Parish., MD;  Location:  [facility];  Service: Urology;;     Social History:   reports that he quit smoking about 12 years ago. His smoking use included cigarettes. He started smoking about 53 years ago. He has a 80 pack-year smoking history.  He has never used smokeless tobacco. He reports that he does not drink alcohol and does not use drugs.   Family History:  His family history includes Esophageal cancer in his cousin; Lung cancer in his mother. There is no history of Colon cancer, Rectal cancer, or Stomach cancer.   Allergies No Known Allergies   Home Medications  Prior to Admission medications   Medication Sig Start Date End Date Taking? Authorizing Provider  acetaminophen (TYLENOL) 325 MG tablet Take 650 mg by mouth every 6 (six) hours as needed (pain.).    [provider]  aspirin EC 81 MG tablet Take 81 mg by mouth in the morning.    [provider]  carvedilol (COREG) 3.125 MG tablet TAKE 1 TABLET BY MOUTH TWICE A DAY WITH FOOD 04/07/23   Laurey Morale, MD  diazepam (VALIUM) 5 MG tablet TAKE 1 TABLET BY MOUTH EVERY 12 HOURS AS NEEDED FOR ANXIETY. 03/29/23   Nelwyn Salisbury, MD  Ferrous Sulfate (IRON PO) Take 1 tablet by mouth in the morning. 1 tab daily    [provider]  furosemide (LASIX) 20 MG tablet Take 2 tablets (40 mg total) by mouth 2 (two) times daily. 04/05/23   Lee, [place], NP  ipratropium (ATROVENT) 0.03 % nasal spray Place 2 sprays into both nostrils every 12 (twelve) hours as needed for rhinitis.    [provider]  Magnesium 500 MG TABS Take 500 mg by mouth in the morning.    [provider]  meclizine (ANTIVERT) 25 MG tablet Take 1 tablet (25 mg total) by mouth 3 (three) times daily as needed for dizziness. 09/09/22   Nelwyn Salisbury, MD  multivitamin-iron-minerals-folic acid (CENTRUM) chewable tablet Chew 1 tablet by mouth daily. 09/23/21   Barrett, Erin R, PA-C  nitroGLYCERIN (NITROSTAT) 0.4 MG SL tablet Place 0.4 mg under the tongue every 5 (five)  minutes as needed for chest pain. 11/03/17 12/05/25  [provider]  oxyCODONE (ROXICODONE) 5 MG immediate release tablet Take 1 tablet (5 mg total) by mouth every 6 (six) hours as needed for breakthrough pain (post-operatively). 03/09/23 03/08/24  Loletta Parish., MD  potassium chloride SA (KLOR-CON M20) 20 MEQ tablet Take 2 tablets (40 mEq total) by mouth daily. 04/05/23   Laurey Morale, MD  rosuvastatin (CRESTOR) 20 MG tablet TAKE 1 TABLET BY MOUTH EVERYDAY AT BEDTIME 02/06/23   Milford, Anderson Malta, FNP  venlafaxine XR (EFFEXOR-XR) 150 MG 24 hr capsule TAKE 1 CAPSULE BY MOUTH DAILY WITH BREAKFAST. 02/07/23   Nelwyn Salisbury, MD     Critical care time: 50 mins       Posey Boyer, MSN, AG-ACNP-BC [facility] 04/13/2023, 5:06 PM  See Amion for pager If no response to pager , please call [phone] until 7pm After 7:00 pm call Elink  [phone]

## 2023-04-13 NOTE — Progress Notes (Signed)
ED Pharmacy Antibiotic Sign Off An antibiotic consult was received from an ED provider for cefepime and vancomycin per pharmacy dosing for sepsis. A chart review was completed to assess appropriateness.   The following one time order(s) were placed:  - cefepime 2g IV x1 - vancomycin 2g IV x1  Further antibiotic and/or antibiotic pharmacy consults should be ordered by the admitting provider if indicated.   Thank you for allowing pharmacy to be a part of this patient's care.   Cherylin Mylar, PharmD Clinical Pharmacist  12/19/202410:22 AM

## 2023-04-13 NOTE — ED Triage Notes (Addendum)
Patient has his bladder taken out November 8th due to cancer. Ever since he has gone downhill. Increased weakness no appetite, not drinking much. Has a urostomy. Supposed to start chemo next week.

## 2023-04-13 NOTE — Consult Note (Signed)
Urology Consult Note   Requesting Attending Physician:  Royanne Foots, DO Service Providing Consult: Urology  Consulting Attending: Dr. Margo Aye   Reason for Consult: Sent from urology office for further workup.  Ill-appearing  HPI: Luis [name] is seen in consultation for reasons noted above at the request of Royanne Foots, DO.  Patient presented to [facility] urology for postoperative assessment following cystectomy with ileal conduit diversion with Dr. [name] on 11/8.  Patient reported nausea, vomiting, and diarrhea for several days.  He was quite ill-appearing and shared decision between PA, Dr. [name], and patient were to present to [facility] emergency department for further workup.  On arrival he was found to have a significant AKI as well as significant leukocytosis without clear etiology.  CT A/P does not show small bowel obstruction, abscess development or urinary extravasation.  I went to see the patient and his wife in the emergency department.  He experienced diarrhea for some time immediately postoperatively, this resolved, and then he has been experiencing this again for the last couple of days.  He has not been able to tolerate any food, or even the smell of it.  He has been bringing up clear liquid, presumably the water that he has been drinking.  They report good ostomy output.  They deny fever.  His blood pressure has been labile and in discussion with the emergency department immediately after seeing him Levophed was about to be initiated with transfer to intensive care unit.  ------------------  Assessment:  67 y.o. male with recent cystectomy with ileal conduit.  Now nausea/vomiting and diarrhea   Recommendations: # Undifferentiated infection +/- volume loss  Laparoscopic radical cystectomy with prostatectomy, bilateral pelvic lymph node dissection, and ileal conduit urinary diversion with Dr. [name] on 03/03/2023 No clear etiology on workup.  Significant AKI and  leukocytosis. Right ureteral stent well-positioned.  No hydronephrosis.  No abscess development.  No extravasation of urine Agree with broad-spectrum ABX and fluid resuscitation.  Trend labs Appreciate ED and critical care team assistance.  Will continue to follow along.  Case and plan discussed with Dr. Margo Aye  Past Medical History: Past Medical History:  Diagnosis Date   Anxiety    takes Valium as needed   Aortic stenosis, moderate    Arthritis    Ascending aortic aneurysm (HCC)    CAD (coronary artery disease)    a. s/p PCI of the RCA 8/12 with DES by Dr Excell Seltzer, preserved EF. b. LHC/RHC (2/16) with mean RA 12, PA 32/15, mean PCWP 18, CI 3.47; patent mid and distal RCA stents, 50-60% proximal stenosis small PDA.      Cancer [facility])    bladder   Carotid stenosis    a. Carotid US (05/2013):  Bilateral 1-39% ICA; L thyroid nodule (prior hx of aspiration).   Chronic diastolic CHF (congestive heart failure) (HCC)    Complication of anesthesia    difficulty waking up after gallbladder surgery   Depression    Dyspnea    Essential hypertension    GERD (gastroesophageal reflux disease)    if needed will take OTC meds    Heart murmur    History of colonic polyps    hyperplastic   Hyperlipidemia    Joint pain    Lesion of bladder    Myocardial infarction (HCC) 2012   Obesity (BMI 30-39.9) 02/29/2016   Pre-diabetes    Restless leg    Sleep apnea    uses cpap   Tubular adenoma of  colon    Vertigo    takes Meclizine as needed    Past Surgical History:  Past Surgical History:  Procedure Laterality Date   AORTIC VALVE REPLACEMENT N/A 09/16/2021   Procedure: AORTIC VALVE REPLACEMENT (AVR);  Surgeon: Alleen Borne, MD;  Location: [facility] OR;  Service: Open Heart Surgery;  Laterality: N/A;   CATARACT EXTRACTION   4 YRS AGO   BOTH EYES   CHOLECYSTECTOMY  07/21/2011   Procedure: LAPAROSCOPIC CHOLECYSTECTOMY WITH INTRAOPERATIVE CHOLANGIOGRAM;  Surgeon: Kandis Cocking, MD;  Location: [facility] ORS;   Service: General;  Laterality: N/A;   CORONARY ANGIOPLASTY  2012   2 stents   coronary stenting     s/p PCI of the RCA by Dr Excell Seltzer 8/12 with 2 promus stents   CYSTOSCOPY W/ RETROGRADES Bilateral 12/13/2019   Procedure: CYSTOSCOPY WITH RETROGRADE PYELOGRAM;  Surgeon: Sebastian Ache, MD;  Location: [facility];  Service: Urology;  Laterality: Bilateral;   CYSTOSCOPY W/ RETROGRADES Bilateral 10/14/2020   Procedure: CYSTOSCOPY WITH RETROGRADE PYELOGRAM;  Surgeon: Sebastian Ache, MD;  Location: [facility];  Service: Urology;  Laterality: Bilateral;   CYSTOSCOPY W/ RETROGRADES Bilateral 12/16/2020   Procedure: CYSTOSCOPY WITH RETROGRADE PYELOGRAM;  Surgeon: Sebastian Ache, MD;  Location: [facility];  Service: Urology;  Laterality: Bilateral;   CYSTOSCOPY W/ RETROGRADES Bilateral 06/03/2022   Procedure: CYSTOSCOPY WITH RETROGRADE PYELOGRAM;  Surgeon: Sebastian Ache, MD;  Location: [facility];  Service: Urology;  Laterality: Bilateral;   CYSTOSCOPY W/ RETROGRADES Bilateral 12/28/2022   Procedure: CYSTOSCOPY WITH RETROGRADE PYELOGRAM, FULGARATION OF BLEEDERS;  Surgeon: Loletta Parish., MD;  Location: [facility];  Service: Urology;  Laterality: Bilateral;   CYSTOSCOPY WITH INJECTION N/A 03/03/2023   Procedure: CYSTOSCOPY WITH INDOCYANINE INJECTION;  Surgeon: Loletta Parish., MD;  Location: [facility];  Service: Urology;  Laterality: N/A;  360 MINUTES   LEFT AND RIGHT HEART CATHETERIZATION WITH CORONARY ANGIOGRAM N/A 06/23/2014   Procedure: LEFT AND RIGHT HEART CATHETERIZATION WITH CORONARY ANGIOGRAM;  Surgeon: Laurey Morale, MD;  Location: [facility] CATH LAB;  Service: Cardiovascular;  Laterality: N/A;   NECK SURGERY  03/23/09   per Dr. Ophelia Charter, cervical fusion    PERICARDIOCENTESIS N/A 09/28/2021   Procedure: PERICARDIOCENTESIS;  Surgeon: Corky Crafts, MD;  Location: [facility] INVASIVE CV LAB;  Service: Cardiovascular;  Laterality: N/A;   REPLACEMENT ASCENDING AORTA  N/A 09/16/2021   Procedure: REPLACEMENT ASCENDING AORTA WITH 30 X HEMASHIELD PLATINUM WOVEN DOUBLE VELOUR VASCULAR GRAFT;  Surgeon: Alleen Borne, MD;  Location: [facility] OR;  Service: Open Heart Surgery;  Laterality: N/A;  CIRC ARREST   right elbow surgery     RIGHT HEART CATH AND CORONARY ANGIOGRAPHY N/A 07/08/2021   Procedure: RIGHT HEART CATH AND CORONARY ANGIOGRAPHY;  Surgeon: Laurey Morale, MD;  Location: [facility] INVASIVE CV LAB;  Service: Cardiovascular;  Laterality: N/A;   ROBOT ASSISTED LAPAROSCOPIC COMPLETE CYSTECT ILEAL CONDUIT N/A 03/03/2023   Procedure: XI ROBOTIC ASSISTED LAPAROSCOPIC COMPLETE CYSTECTECTOMY WITH  ILEAL CONDUIT DIVERSION;  Surgeon: Loletta Parish., MD;  Location: [facility];  Service: Urology;  Laterality: N/A;   ROBOT ASSISTED LAPAROSCOPIC RADICAL PROSTATECTOMY N/A 03/03/2023   Procedure: XI ROBOTIC ASSISTED LAPAROSCOPIC RADICAL PROSTATECTOMY WITH LYMPH NODE DISSECTION;  Surgeon: Loletta Parish., MD;  Location: [facility];  Service: Urology;  Laterality: N/A;   solonscopy  05/23/08   per Dr. Celene Kras hemorrhoids only, repeat in 5 years   SUBXYPHOID PERICARDIAL WINDOW N/A 09/28/2021   Procedure:  SUBXYPHOID PERICARDIAL WINDOW;  Surgeon: Alleen Borne, MD;  Location[facility] OR;  Service: Thoracic;  Laterality: N/A;   TEE WITHOUT CARDIOVERSION N/A 06/23/2014   Procedure: TRANSESOPHAGEAL ECHOCARDIOGRAM (TEE);  Surgeon: Laurey Morale, MD;  Location: [facility] ENDOSCOPY;  Service: Cardiovascular;  Laterality: N/A;   TEE WITHOUT CARDIOVERSION N/A 01/21/2016   Procedure: TRANSESOPHAGEAL ECHOCARDIOGRAM (TEE);  Surgeon: Laurey Morale, MD;  Location: [facility] ENDOSCOPY;  Service: Cardiovascular;  Laterality: N/A;   TEE WITHOUT CARDIOVERSION N/A 09/16/2021   Procedure: TRANSESOPHAGEAL ECHOCARDIOGRAM (TEE);  Surgeon: Alleen Borne, MD;  Location: [facility] OR;  Service: Open Heart Surgery;  Laterality: N/A;   TEE WITHOUT CARDIOVERSION N/A 09/28/2021   Procedure: TRANSESOPHAGEAL ECHOCARDIOGRAM  (TEE);  Surgeon: Alleen Borne, MD;  Location: [facility] OR;  Service: Thoracic;  Laterality: N/A;   TONSILLECTOMY     TRANSURETHRAL RESECTION OF BLADDER TUMOR N/A 10/21/2019   Procedure: TRANSURETHRAL RESECTION OF BLADDER TUMOR (TURBT);  Surgeon: Ihor Gully, MD;  Location: [facility];  Service: Urology;  Laterality: N/A;   TRANSURETHRAL RESECTION OF BLADDER TUMOR N/A 12/13/2019   Procedure: TRANSURETHRAL RESECTION OF BLADDER TUMOR (TURBT);  Surgeon: Sebastian Ache, MD;  Location: [facility];  Service: Urology;  Laterality: N/A;  1 HR   TRANSURETHRAL RESECTION OF BLADDER TUMOR N/A 10/14/2020   Procedure: TRANSURETHRAL RESECTION OF BLADDER TUMOR (TURBT);  Surgeon: Sebastian Ache, MD;  Location: [facility];  Service: Urology;  Laterality: N/A;   TRANSURETHRAL RESECTION OF BLADDER TUMOR N/A 12/16/2020   Procedure: RESTAGING TRANSURETHRAL RESECTION OF BLADDER TUMOR (TURBT);  Surgeon: Sebastian Ache, MD;  Location: [facility];  Service: Urology;  Laterality: N/A;   TRANSURETHRAL RESECTION OF BLADDER TUMOR N/A 06/03/2022   Procedure: TRANSURETHRAL RESECTION OF BLADDER TUMOR (TURBT);  Surgeon: Sebastian Ache, MD;  Location: [facility];  Service: Urology;  Laterality: N/A;   UMBILICAL HERNIA REPAIR  03/03/2023   Procedure: HERNIA REPAIR UMBILICAL;  Surgeon: Loletta Parish., MD;  Location: [facility];  Service: Urology;;    Medication: Current Facility-Administered Medications  Medication Dose Route Frequency Provider Last Rate Last Admin   lactated ringers infusion   Intravenous Continuous [name], PA-C 150 mL/hr at 04/13/23 1128 New Bag at 04/13/23 1128   norepinephrine (LEVOPHED) 4mg  in (0.016 mg/mL) premix infusion  0-40 mcg/min Intravenous Continuous Estelle June A, DO 7.5 mL/hr at 04/13/23 1537 2 mcg/min at 04/13/23 1537   Current Outpatient Medications  Medication Sig Dispense Refill   acetaminophen (TYLENOL) 325 MG  tablet Take 650 mg by mouth every 6 (six) hours as needed (pain.).     aspirin EC 81 MG tablet Take 81 mg by mouth in the morning.     carvedilol (COREG) 3.125 MG tablet TAKE 1 TABLET BY MOUTH TWICE A DAY WITH FOOD 180 tablet 1   diazepam (VALIUM) 5 MG tablet TAKE 1 TABLET BY MOUTH EVERY 12 HOURS AS NEEDED FOR ANXIETY. 60 tablet 5   Ferrous Sulfate (IRON PO) Take 1 tablet by mouth in the morning. 1 tab daily     furosemide (LASIX) 20 MG tablet Take 2 tablets (40 mg total) by mouth 2 (two) times daily. 360 tablet 3   ipratropium (ATROVENT) 0.03 % nasal spray Place 2 sprays into both nostrils every 12 (twelve) hours as needed for rhinitis.     Magnesium 500 MG TABS Take 500 mg by mouth in the morning.     meclizine (ANTIVERT) 25 MG tablet Take 1 tablet (25  mg total) by mouth 3 (three) times daily as needed for dizziness. 30 tablet 0   multivitamin-iron-minerals-folic acid (CENTRUM) chewable tablet Chew 1 tablet by mouth daily.     nitroGLYCERIN (NITROSTAT) 0.4 MG SL tablet Place 0.4 mg under the tongue every 5 (five) minutes as needed for chest pain.     oxyCODONE (ROXICODONE) 5 MG immediate release tablet Take 1 tablet (5 mg total) by mouth every 6 (six) hours as needed for breakthrough pain (post-operatively). 20 tablet 0   potassium chloride SA (KLOR-CON M20) 20 MEQ tablet Take 2 tablets (40 mEq total) by mouth daily. 180 tablet 3   rosuvastatin (CRESTOR) 20 MG tablet TAKE 1 TABLET BY MOUTH EVERYDAY AT BEDTIME 90 tablet 3   venlafaxine XR (EFFEXOR-XR) 150 MG 24 hr capsule TAKE 1 CAPSULE BY MOUTH DAILY WITH BREAKFAST. 90 capsule 0    Allergies: No Known Allergies  Social History: Social History   Tobacco Use   Smoking status: Former    Current packs/day: 0.00    Average packs/day: 2.0 packs/day for 40.0 years (80.0 ttl pk-yrs)    Types: Cigarettes    Start date: 25    Quit date: 2012    Years since quitting: 12.9   Smokeless tobacco: Never  Vaping Use   Vaping status: Never Used   Substance Use Topics   Alcohol use: No    Alcohol/week: 0.0 standard drinks of alcohol   Drug use: No    Family History Family History  Problem Relation Age of Onset   Lung cancer Mother        lung   Esophageal cancer Cousin    Colon cancer Neg Hx    Rectal cancer Neg Hx    Stomach cancer Neg Hx     Review of Systems  Gastrointestinal:  Positive for diarrhea, nausea and vomiting.     Objective   Vital signs in last 24 hours: BP (!) 93/55   Pulse (!) 58   Temp 98.3 F (36.8 C) (Oral)   Resp (!) 21   Ht 5\' 6"  (1.676 m)   Wt 117.9 kg   SpO2 98%   BMI 41.97 kg/m   Physical Exam General: NAD, A&O, resting, appropriate HEENT: [place]/AT Pulmonary: Normal work of breathing Cardiovascular: RRR, no cyanosis Abdomen: Soft, protuberant GU: Ureteral stent appears well-positioned.  Urostomy, beefy pink clear yellow urine with expected mucus stranding. Neuro: Appropriate, no focal neurological deficits  Most Recent Labs: Lab Results  Component Value Date   WBC 19.6 (H) 04/13/2023   HGB 9.8 (L) 04/13/2023   HCT 31.8 (L) 04/13/2023   PLT 260 04/13/2023    Lab Results  Component Value Date   NA 132 (L) 04/13/2023   K 4.2 04/13/2023   CL 99 04/13/2023   CO2 19 (L) 04/13/2023   BUN 33 (H) 04/13/2023   CREATININE 2.42 (H) 04/13/2023   CALCIUM 8.8 (L) 04/13/2023   MG 2.7 (H) 09/17/2021    Lab Results  Component Value Date   INR 1.3 (H) 04/13/2023   APTT 38 (H) 04/13/2023     Urine Culture: @[id number] (laburin,org,[id number])@   IMAGING: CT ABDOMEN PELVIS WO CONTRAST Result Date: 04/13/2023 CLINICAL DATA:  Status post cystectomy on 03/03/2023 with pain and fever, initial encounter EXAM: CT ABDOMEN AND PELVIS WITHOUT CONTRAST TECHNIQUE: Multidetector CT imaging of the abdomen and pelvis was performed following the standard protocol without IV contrast. RADIATION DOSE REDUCTION: This exam was performed according to the departmental dose-optimization program which  includes automated exposure  control, adjustment of the mA and/or kV according to patient size and/or use of iterative reconstruction technique. COMPARISON:  12/21/2022 FINDINGS: Lower chest: No acute abnormality. Hepatobiliary: Fatty infiltration of the liver is noted. Gallbladder has been surgically removed. Pancreas: Unremarkable. No pancreatic ductal dilatation or surrounding inflammatory changes. Spleen: Normal in size without focal abnormality. Adrenals/Urinary Tract: Adrenal glands are within normal limits. Kidneys demonstrate no renal calculi. Small exophytic cysts are noted in left kidney stable from the prior exam. No further follow-up is recommended. Mild fullness of the collecting systems is noted bilaterally. A ureteral stent is noted on the right. Interval cystectomy is noted with urostomy in the right lower quadrant. The ureteral stent on the right extends into the ostomy bag. Stomach/Bowel: Stomach is within normal limits. Small bowel shows no obstructive change. Scattered fluid and fecal material is noted throughout the colon. No obstructive changes are seen. Postsurgical changes in the right lower quadrant are noted consistent with creation of an ileal conduit Vascular/Lymphatic: Aortic atherosclerosis. No enlarged abdominal or pelvic lymph nodes. Reproductive: Prostate has been surgically removed during the cystectomy procedure. No fluid is noted in the operative bed. Other: No free fluid is noted.  No focal herniation is noted. Musculoskeletal: No acute or significant osseous findings. IMPRESSION: Changes consistent with history of prior cystectomy and prostatectomy with ileal conduit formation in the right mid abdomen. Mild fullness of the collecting systems is noted bilaterally. A right-sided nephroureteral stent is noted extending into the ostomy bag. No evidence of postoperative abscess or extravasation is identified. No acute abnormality is seen. Electronically Signed   By: Alcide Clever M.D.    On: 04/13/2023 15:32   DG Chest Port 1 View Result Date: 04/13/2023 CLINICAL DATA:  Possible sepsis EXAM: PORTABLE CHEST 1 VIEW COMPARISON:  Chest x-ray October 20, 2021 FINDINGS: The cardiomediastinal silhouette is unchanged in contour status post median sternotomy. No focal pulmonary opacity. No pleural effusion or pneumothorax. The visualized upper abdomen is unremarkable. No acute osseous abnormality. IMPRESSION: No active disease. Electronically Signed   By: Jacob Moores M.D.   On: 04/13/2023 14:09    ------  Elmon Kirschner, NP Pager: [phone]   Please contact the urology consult pager with any further questions/concerns.

## 2023-04-14 DIAGNOSIS — G4733 Obstructive sleep apnea (adult) (pediatric): Secondary | ICD-10-CM

## 2023-04-14 DIAGNOSIS — B952 Enterococcus as the cause of diseases classified elsewhere: Secondary | ICD-10-CM

## 2023-04-14 DIAGNOSIS — R001 Bradycardia, unspecified: Secondary | ICD-10-CM

## 2023-04-14 DIAGNOSIS — Z952 Presence of prosthetic heart valve: Secondary | ICD-10-CM | POA: Diagnosis not present

## 2023-04-14 DIAGNOSIS — A419 Sepsis, unspecified organism: Secondary | ICD-10-CM | POA: Diagnosis not present

## 2023-04-14 DIAGNOSIS — R579 Shock, unspecified: Secondary | ICD-10-CM | POA: Diagnosis not present

## 2023-04-14 DIAGNOSIS — A498 Other bacterial infections of unspecified site: Secondary | ICD-10-CM | POA: Diagnosis present

## 2023-04-14 DIAGNOSIS — R7881 Bacteremia: Secondary | ICD-10-CM | POA: Diagnosis present

## 2023-04-14 DIAGNOSIS — N179 Acute kidney failure, unspecified: Secondary | ICD-10-CM | POA: Diagnosis not present

## 2023-04-14 LAB — GLUCOSE, CAPILLARY
Glucose-Capillary: 102 mg/dL — ABNORMAL HIGH (ref 70–99)
Glucose-Capillary: 134 mg/dL — ABNORMAL HIGH (ref 70–99)
Glucose-Capillary: 147 mg/dL — ABNORMAL HIGH (ref 70–99)
Glucose-Capillary: 151 mg/dL — ABNORMAL HIGH (ref 70–99)
Glucose-Capillary: 89 mg/dL (ref 70–99)
Glucose-Capillary: 92 mg/dL (ref 70–99)

## 2023-04-14 LAB — CBC
HCT: 23.7 % — ABNORMAL LOW (ref 39.0–52.0)
Hemoglobin: 7.2 g/dL — ABNORMAL LOW (ref 13.0–17.0)
MCH: 27.1 pg (ref 26.0–34.0)
MCHC: 30.4 g/dL (ref 30.0–36.0)
MCV: 89.1 fL (ref 80.0–100.0)
Platelets: 163 10*3/uL (ref 150–400)
RBC: 2.66 MIL/uL — ABNORMAL LOW (ref 4.22–5.81)
RDW: 15.9 % — ABNORMAL HIGH (ref 11.5–15.5)
WBC: 8.6 10*3/uL (ref 4.0–10.5)
nRBC: 0 % (ref 0.0–0.2)

## 2023-04-14 LAB — BASIC METABOLIC PANEL
Anion gap: 12 (ref 5–15)
BUN: 29 mg/dL — ABNORMAL HIGH (ref 8–23)
CO2: 18 mmol/L — ABNORMAL LOW (ref 22–32)
Calcium: 7.7 mg/dL — ABNORMAL LOW (ref 8.9–10.3)
Chloride: 104 mmol/L (ref 98–111)
Creatinine, Ser: 2.01 mg/dL — ABNORMAL HIGH (ref 0.61–1.24)
GFR, Estimated: 35 mL/min — ABNORMAL LOW (ref >60)
Glucose, Bld: 109 mg/dL — ABNORMAL HIGH (ref 70–99)
Potassium: 3.4 mmol/L — ABNORMAL LOW (ref 3.5–5.1)
Sodium: 134 mmol/L — ABNORMAL LOW (ref 135–145)

## 2023-04-14 LAB — HEMOGLOBIN AND HEMATOCRIT, BLOOD
HCT: 24.8 % — ABNORMAL LOW (ref 39.0–52.0)
HCT: 27.5 % — ABNORMAL LOW (ref 39.0–52.0)
Hemoglobin: 7.3 g/dL — ABNORMAL LOW (ref 13.0–17.0)
Hemoglobin: 8.4 g/dL — ABNORMAL LOW (ref 13.0–17.0)

## 2023-04-14 LAB — HIV ANTIBODY (ROUTINE TESTING W REFLEX): HIV Screen 4th Generation wRfx: NONREACTIVE

## 2023-04-14 LAB — OCCULT BLOOD X 1 CARD TO LAB, STOOL: Fecal Occult Bld: NEGATIVE

## 2023-04-14 LAB — PREPARE RBC (CROSSMATCH)

## 2023-04-14 MED ORDER — BOOST / RESOURCE BREEZE PO LIQD CUSTOM
1.0000 | Freq: Three times a day (TID) | ORAL | Status: DC
  Administered 2023-04-14 – 2023-04-15 (×5): 1 via ORAL

## 2023-04-14 MED ORDER — MIDODRINE HCL 5 MG PO TABS
5.0000 mg | ORAL_TABLET | Freq: Three times a day (TID) | ORAL | Status: DC
  Administered 2023-04-14 – 2023-04-15 (×4): 5 mg via ORAL
  Filled 2023-04-14 (×4): qty 1

## 2023-04-14 MED ORDER — POTASSIUM CHLORIDE 10 MEQ/100ML IV SOLN
10.0000 meq | INTRAVENOUS | Status: AC
  Administered 2023-04-14 (×2): 10 meq via INTRAVENOUS
  Filled 2023-04-14 (×2): qty 100

## 2023-04-14 MED ORDER — ATROPINE SULFATE 1 MG/10ML IJ SOSY
PREFILLED_SYRINGE | INTRAMUSCULAR | Status: AC
  Filled 2023-04-14: qty 10

## 2023-04-14 MED ORDER — SODIUM CHLORIDE 0.9% IV SOLUTION: Freq: Once | INTRAVENOUS | Status: AC

## 2023-04-14 MED ORDER — SODIUM CHLORIDE 0.9 % IV SOLN
2.0000 g | Freq: Two times a day (BID) | INTRAVENOUS | Status: DC
  Administered 2023-04-14 – 2023-04-17 (×7): 2 g via INTRAVENOUS
  Filled 2023-04-14 (×8): qty 20

## 2023-04-14 MED ORDER — NOREPINEPHRINE 4 MG/250ML-% IV SOLN
2.0000 ug/min | INTRAVENOUS | Status: DC
  Administered 2023-04-14: 2 ug/min via INTRAVENOUS

## 2023-04-14 MED ORDER — VENLAFAXINE HCL ER 150 MG PO CP24
150.0000 mg | ORAL_CAPSULE | Freq: Every day | ORAL | Status: DC
  Administered 2023-04-14 – 2023-04-18 (×5): 150 mg via ORAL
  Filled 2023-04-14 (×5): qty 1

## 2023-04-14 MED ORDER — SODIUM CHLORIDE 0.9 % IV SOLN
250.0000 mL | INTRAVENOUS | Status: DC
  Administered 2023-04-14: 250 mL via INTRAVENOUS

## 2023-04-14 NOTE — Progress Notes (Signed)
This nurse informed Dr. Vassie Loll of patient's continued sinus bradycardia (lowest HR 48, consistently in 50's). Per Dr. Vassie Loll, "Ok as long as sinus brady & asymptomatic. If heart rate less than 40, you can put pads on him or keep atropine at bedside."

## 2023-04-14 NOTE — Care Management (Signed)
Transition of Care [facility]) - Inpatient Brief Assessment   Patient Details  Name: Luis [name] MRN: [id number] Date of Birth: 1955-11-28  Transition [facility]) CM/SW Contact:    Lavenia Atlas, RN Phone Number: 04/14/2023, 6:57 PM   Clinical Narrative: Per chart review patient currently in [facility] ICU for bacteremia. Patient was to start chemo next week.  TOC following for discharge needs.   Transition of Care Asessment: Insurance and Status: Insurance coverage has been reviewed Patient has primary care physician: Yes Home environment has been reviewed: from home with spouse Prior level of function:: independent Prior/Current Home Services: No current home services Social Drivers of Health Review: SDOH reviewed no interventions necessary Readmission risk has been reviewed: Yes Transition of care needs: no transition of care needs at this time

## 2023-04-14 NOTE — Progress Notes (Signed)
This nurse changed the patient's conduit pouch this day; exudate/teal-colored discharge found in/around stoma. Informed night shift nurse to let day shift know and urology to be made aware of this finding.

## 2023-04-14 NOTE — Consult Note (Signed)
Date of Admission:  04/13/2023          Reason for Consult: Enterococcal bacteremia, rule out endocarditis    Referring Provider: CHAMP Auto consult and Cyril Mourning, MD   Assessment:  Enterococcus faecalis bacteremia rule out native, prosthetic valve endocarditis and aortic graft infection Bladder cancer sp TURBT, chemotherapy, recent ileal conduit AKI OSA  Plan:  Change antibiotics to ceftriaxone ampicillin Repeat blood cultures tomorrow 2D echocardiogram--and he will need a TEE  Principal Problem:   Bacteremia Active Problems:   Shock (HCC)   Enterococcus faecalis infection   History of prosthetic aortic valve   Scheduled Meds:  sodium chloride   Intravenous Once   aspirin EC  81 mg Oral Daily   Chlorhexidine Gluconate Cloth  6 each Topical Daily   feeding supplement  1 Container Oral TID BM   midodrine  5 mg Oral TID   rosuvastatin  10 mg Oral Daily   venlafaxine XR  150 mg Oral Q breakfast   Continuous Infusions:  ampicillin (OMNIPEN) IV 2 g (04/14/23 1138)   cefTRIAXone (ROCEPHIN)  IV Stopped (04/14/23 1030)   PRN Meds:.acetaminophen, docusate sodium, ondansetron (ZOFRAN) IV, [START ON 04/16/2023] pneumococcal 20-valent conjugate vaccine, polyethylene glycol  HPI: Luis [name] is a 67 y.o. male with past medical history significant for aortic valve replacement and aortic graft placement in 2023 with also history of pericardial effusion status post pericardial window, bladder cancer status post TURBT and 8 cycles of pembrolizumab (03/03/23). He is followed by Dr. [name] with oncology, scheduled to Tentatively scheduled to start gemcitabine and cisplatin after Christmas given positive margins and spread to lymph nodes next week   otal cystectomy with ileal conduit and prostatectomy Nov 8 w/ Dr. [name]. Wife reports they noticed an odor from ileal conduit when symptoms started. Was eating normally but since gags at the smell of food with occasional  non-bloody emesis .  He had a temperature up to 104 degrees reportedly.  He ultimately was convinced to come into the hospital by his wife.  He had progressive sepsis and septic shock requiring Levophed.  Blood cultures drawn on admission are now growing Enterococcus faecalis.  CT abdomen pelvis does not show any acute intra-abdominal pathology or infection.  Given his history of his aortic valve replacement aortic graft I have concerns about both native and potential prosthetic valve endocarditis and potential for graft infection.  We will change him over to ampicillin along with ceftriaxone for dual beta-lactam therapy for presumptive treatment of endocarditis.  We are obtaining a 2D echocardiogram but he will also need a transesophageal echocardiogram.  I have personally spent 111 minutes involved in face-to-face and non-face-to-face activities for this patient on the day of the visit. Professional time spent includes the following activities: Preparing to see the patient (review of tests), Obtaining and/or reviewing separately obtained history (admission/discharge record), Performing a medically appropriate examination and/or evaluation , Ordering medications/tests/procedures, referring and communicating with other health care professionals, Documenting clinical information in the EMR, Independently interpreting results (not separately reported), Communicating results to the patient/family/caregiver, Counseling and educating the patient/family/caregiver and Care coordination (not separately reported).    Review of Systems: Review of Systems  Constitutional:  Positive for chills, fever, malaise/fatigue and weight loss.  HENT:  Negative for congestion and sore throat.   Eyes:  Negative for blurred vision and photophobia.  Respiratory:  Negative for cough, shortness of breath and wheezing.   Cardiovascular:  Negative for chest  pain, palpitations and leg swelling.  Gastrointestinal:  Negative for  abdominal pain, blood in stool, constipation, diarrhea, heartburn, melena, nausea and vomiting.  Genitourinary:  Negative for dysuria, flank pain and hematuria.  Musculoskeletal:  Negative for back pain, falls, joint pain and myalgias.  Skin:  Negative for itching and rash.  Neurological:  Positive for weakness. Negative for dizziness, focal weakness, loss of consciousness and headaches.  Endo/Heme/Allergies:  Does not bruise/bleed easily.  Psychiatric/Behavioral:  Negative for depression and suicidal ideas. The patient does not have insomnia.     Past Medical History:  Diagnosis Date   Anxiety    takes Valium as needed   Aortic stenosis, moderate    Arthritis    Ascending aortic aneurysm (HCC)    CAD (coronary artery disease)    a. s/p PCI of the RCA 8/12 with DES by Dr Excell Seltzer, preserved EF. b. LHC/RHC (2/16) with mean RA 12, PA 32/15, mean PCWP 18, CI 3.47; patent mid and distal RCA stents, 50-60% proximal stenosis small PDA.      Cancer [facility])    bladder   Carotid stenosis    a. Carotid US (05/2013):  Bilateral 1-39% ICA; L thyroid nodule (prior hx of aspiration).   Chronic diastolic CHF (congestive heart failure) (HCC)    Complication of anesthesia    difficulty waking up after gallbladder surgery   Depression    Dyspnea    Essential hypertension    GERD (gastroesophageal reflux disease)    if needed will take OTC meds    Heart murmur    History of colonic polyps    hyperplastic   Hyperlipidemia    Joint pain    Lesion of bladder    Myocardial infarction (HCC) 2012   Obesity (BMI 30-39.9) 02/29/2016   Pre-diabetes    Restless leg    Sleep apnea    uses cpap   Tubular adenoma of colon    Vertigo    takes Meclizine as needed    Social History   Tobacco Use   Smoking status: Former    Current packs/day: 0.00    Average packs/day: 2.0 packs/day for 40.0 years (80.0 ttl pk-yrs)    Types: Cigarettes    Start date: 73    Quit date: 2012    Years since quitting:  12.9   Smokeless tobacco: Never  Vaping Use   Vaping status: Never Used  Substance Use Topics   Alcohol use: No    Alcohol/week: 0.0 standard drinks of alcohol   Drug use: No    Family History  Problem Relation Age of Onset   Lung cancer Mother        lung   Esophageal cancer Cousin    Colon cancer Neg Hx    Rectal cancer Neg Hx    Stomach cancer Neg Hx    No Known Allergies  OBJECTIVE: Blood pressure (!) 86/48, pulse (!) 50, temperature 98.9 F (37.2 C), temperature source Oral, resp. rate 10, height 5\' 6"  (1.676 m), weight 124 kg, SpO2 100%.  Physical Exam Constitutional:      Appearance: He is well-developed.  HENT:     Head: Normocephalic and atraumatic.  Eyes:     Conjunctiva/sclera: Conjunctivae normal.  Cardiovascular:     Rate and Rhythm: Normal rate and regular rhythm.     Heart sounds: Murmur heard.     No friction rub. No gallop.  Pulmonary:     Effort: Pulmonary effort is normal. No respiratory distress.  Breath sounds: No stridor. No wheezing or rhonchi.  Abdominal:     General: There is no distension.     Palpations: Abdomen is soft.  Musculoskeletal:        General: No tenderness. Normal range of motion.     Cervical back: Normal range of motion and neck supple.  Skin:    General: Skin is warm and dry.     Coloration: Skin is not pale.     Findings: No erythema or rash.  Neurological:     General: No focal deficit present.     Mental Status: He is alert and oriented to person, place, and time.  Psychiatric:        Mood and Affect: Mood normal.        Behavior: Behavior normal.        Thought Content: Thought content normal.        Judgment: Judgment normal.     Lab Results Lab Results  Component Value Date   WBC 8.6 04/14/2023   HGB 7.3 (L) 04/14/2023   HCT 24.8 (L) 04/14/2023   MCV 89.1 04/14/2023   PLT 163 04/14/2023    Lab Results  Component Value Date   CREATININE 2.01 (H) 04/14/2023   BUN 29 (H) 04/14/2023   NA 134 (L)  04/14/2023   K 3.4 (L) 04/14/2023   CL 104 04/14/2023   CO2 18 (L) 04/14/2023    Lab Results  Component Value Date   ALT 32 04/13/2023   AST 36 04/13/2023   ALKPHOS 136 (H) 04/13/2023   BILITOT 1.0 04/13/2023     Microbiology: Recent Results (from the past 240 hours)  Resp panel by RT-PCR (RSV, Flu A&B, Covid) Anterior Nasal Swab     Status: None   Collection Time: 04/13/23 10:14 AM   Specimen: Anterior Nasal Swab  Result Value Ref Range Status   SARS Coronavirus 2 by RT PCR NEGATIVE NEGATIVE Final    Comment: (NOTE) SARS-CoV-2 target nucleic acids are NOT DETECTED.  The SARS-CoV-2 RNA is generally detectable in upper respiratory specimens during the acute phase of infection. The lowest concentration of SARS-CoV-2 viral copies this assay can detect is 138 copies/mL. A negative result does not preclude SARS-Cov-2 infection and should not be used as the sole basis for treatment or other patient management decisions. A negative result may occur with  improper specimen collection/handling, submission of specimen other than nasopharyngeal swab, presence of viral mutation(s) within the areas targeted by this assay, and inadequate number of viral copies(<138 copies/mL). A negative result must be combined with clinical observations, patient history, and epidemiological information. The expected result is Negative.  Fact Sheet for Patients:  BloggerCourse.com  Fact Sheet for Healthcare Providers:  SeriousBroker.it  This test is no t yet approved or cleared by the [place] FDA and  has been authorized for detection and/or diagnosis of SARS-CoV-2 by FDA under an Emergency Use Authorization (EUA). This EUA will remain  in effect (meaning this test can be used) for the duration of the COVID-19 declaration under Section 564(b)(1) of the Act, 21 U.S.C.section 360bbb-3(b)(1), unless the authorization is terminated  or revoked  sooner.       Influenza A by PCR NEGATIVE NEGATIVE Final   Influenza B by PCR NEGATIVE NEGATIVE Final    Comment: (NOTE) The Xpert Xpress SARS-CoV-2/FLU/RSV plus assay is intended as an aid in the diagnosis of influenza from Nasopharyngeal swab specimens and should not be used as a sole basis for treatment. Nasal  washings and aspirates are unacceptable for Xpert Xpress SARS-CoV-2/FLU/RSV testing.  Fact Sheet for Patients: BloggerCourse.com  Fact Sheet for Healthcare Providers: SeriousBroker.it  This test is not yet approved or cleared by the [place] FDA and has been authorized for detection and/or diagnosis of SARS-CoV-2 by FDA under an Emergency Use Authorization (EUA). This EUA will remain in effect (meaning this test can be used) for the duration of the COVID-19 declaration under Section 564(b)(1) of the Act, 21 U.S.C. section 360bbb-3(b)(1), unless the authorization is terminated or revoked.     Resp Syncytial Virus by PCR NEGATIVE NEGATIVE Final    Comment: (NOTE) Fact Sheet for Patients: BloggerCourse.com  Fact Sheet for Healthcare Providers: SeriousBroker.it  This test is not yet approved or cleared by the [place] FDA and has been authorized for detection and/or diagnosis of SARS-CoV-2 by FDA under an Emergency Use Authorization (EUA). This EUA will remain in effect (meaning this test can be used) for the duration of the COVID-19 declaration under Section 564(b)(1) of the Act, 21 U.S.C. section 360bbb-3(b)(1), unless the authorization is terminated or revoked.  Performed at [facility], [address] [place], [place] [address]   Urine Culture     Status: Abnormal (Preliminary result)   Collection Time: 04/13/23 10:14 AM   Specimen: Urine, Random  Result Value Ref Range Status   Specimen Description   Final    URINE,  RANDOM Performed at [facility], [address], [place], [place] [address]    Special Requests   Final    NONE Reflexed from [id number] Performed at [facility], [address], [place], [place] [address]    Culture (A)  Final    >=100,000 COLONIES/mL ENTEROCOCCUS FAECALIS >=100,000 COLONIES/mL STENOTROPHOMONAS MALTOPHILIA    Report Status PENDING  Incomplete  Blood Culture (routine x 2)     Status: Abnormal (Preliminary result)   Collection Time: 04/13/23 10:24 AM   Specimen: BLOOD  Result Value Ref Range Status   Specimen Description   Final    BLOOD RIGHT ANTECUBITAL Performed at [facility], [address] [place], [place] [address]    Special Requests   Final    BOTTLES DRAWN AEROBIC AND ANAEROBIC Blood Culture adequate volume Performed at [facility], [address], [place], [place] [address]    Culture  Setup Time   Final    GRAM POSITIVE COCCI IN CHAINS IN BOTH AEROBIC AND ANAEROBIC BOTTLES CRITICAL RESULT CALLED TO, READ BACK BY AND VERIFIED WITH: PHARMD L. Bernarda Caffey [id number] @ 2313 FH    Culture (A)  Final    ENTEROCOCCUS FAECALIS CULTURE REINCUBATED FOR BETTER GROWTH Performed at [facility], [address], [place], [place] [address]    Report Status PENDING  Incomplete  Blood Culture ID Panel (Reflexed)     Status: Abnormal   Collection Time: 04/13/23 10:24 AM  Result Value Ref Range Status   Enterococcus faecalis DETECTED (A) NOT DETECTED Final    Comment: CRITICAL RESULT CALLED TO, READ BACK BY AND VERIFIED WITH: PHARMD L. Bernarda Caffey [id number] @ 2313 FH    Enterococcus Faecium NOT DETECTED NOT DETECTED Final   Listeria monocytogenes NOT DETECTED NOT DETECTED Final   Staphylococcus species NOT DETECTED NOT DETECTED Final   Staphylococcus aureus (BCID) NOT DETECTED NOT DETECTED Final   Staphylococcus epidermidis NOT DETECTED NOT DETECTED Final   Staphylococcus lugdunensis  NOT DETECTED NOT DETECTED Final   Streptococcus species NOT DETECTED NOT  DETECTED Final   Streptococcus agalactiae NOT DETECTED NOT DETECTED Final   Streptococcus pneumoniae NOT DETECTED NOT DETECTED Final   Streptococcus pyogenes NOT DETECTED NOT DETECTED Final   A.calcoaceticus-baumannii NOT DETECTED NOT DETECTED Final   Bacteroides fragilis NOT DETECTED NOT DETECTED Final   Enterobacterales NOT DETECTED NOT DETECTED Final   Enterobacter cloacae complex NOT DETECTED NOT DETECTED Final   Escherichia coli NOT DETECTED NOT DETECTED Final   Klebsiella aerogenes NOT DETECTED NOT DETECTED Final   Klebsiella oxytoca NOT DETECTED NOT DETECTED Final   Klebsiella pneumoniae NOT DETECTED NOT DETECTED Final   Proteus species NOT DETECTED NOT DETECTED Final   Salmonella species NOT DETECTED NOT DETECTED Final   Serratia marcescens NOT DETECTED NOT DETECTED Final   Haemophilus influenzae NOT DETECTED NOT DETECTED Final   Neisseria meningitidis NOT DETECTED NOT DETECTED Final   Pseudomonas aeruginosa NOT DETECTED NOT DETECTED Final   Stenotrophomonas maltophilia NOT DETECTED NOT DETECTED Final   Candida albicans NOT DETECTED NOT DETECTED Final   Candida auris NOT DETECTED NOT DETECTED Final   Candida glabrata NOT DETECTED NOT DETECTED Final   Candida krusei NOT DETECTED NOT DETECTED Final   Candida parapsilosis NOT DETECTED NOT DETECTED Final   Candida tropicalis NOT DETECTED NOT DETECTED Final   Cryptococcus neoformans/gattii NOT DETECTED NOT DETECTED Final   Vancomycin resistance NOT DETECTED NOT DETECTED Final    Comment: Performed at [facility], [address], [place], [place] [address]  Blood Culture (routine x 2)     Status: None (Preliminary result)   Collection Time: 04/13/23 10:27 AM   Specimen: BLOOD LEFT HAND  Result Value Ref Range Status   Specimen Description   Final    BLOOD LEFT HAND Performed at [facility], [address], [place], [place] [address]     Special Requests   Final    BOTTLES DRAWN AEROBIC AND ANAEROBIC Blood Culture adequate volume Performed at [facility], [address], [place], [place] [address]    Culture  Setup Time   Final    GRAM POSITIVE COCCI IN CHAINS IN BOTH AEROBIC AND ANAEROBIC BOTTLES Performed at [facility], [address], [place], [place] [address]    Culture GRAM POSITIVE COCCI IN CHAINS  Final   Report Status PENDING  Incomplete  MRSA Next Gen by PCR, Nasal     Status: None   Collection Time: 04/13/23  5:27 PM   Specimen: Nasal Mucosa; Nasal Swab  Result Value Ref Range Status   MRSA by PCR Next Gen NOT DETECTED NOT DETECTED Final    Comment: (NOTE) The GeneXpert MRSA Assay (FDA approved for NASAL specimens only), is one component of a comprehensive MRSA colonization surveillance program. It is not intended to diagnose MRSA infection nor to guide or monitor treatment for MRSA infections. Test performance is not FDA approved in patients less than 24 years old. Performed at [facility], [address], [place], [place] [address]     Acey Lav, MD [facility] [phone] pager  04/14/2023, 12:02 PM

## 2023-04-14 NOTE — Progress Notes (Signed)
     [facility] has been requested to perform a transesophageal echocardiogram on Luis [name] for bacteremia.  After careful review of history and examination, the risks and benefits of transesophageal echocardiogram have been explained including risks of esophageal damage, perforation (1:10,000 risk), bleeding, pharyngeal hematoma as well as other potential complications associated with conscious sedation including aspiration, arrhythmia, respiratory failure and death. Alternatives to treatment were discussed, questions were answered. Patient is willing to proceed.   Procedure is scheduled for [name] 04/17/2023 at 8:30am with Dr. Anne Fu. Will place pre-procedural orders.  Of note, patient has had acute on chronic anemia this admission. Hemoglobin 7.2 today. Per reviewed of rounding notes form primary team, no evidence of blood loss. He is currently receiving 1 unit of PRBCs. Our team will follow-up on labs prior to procedure.    Corrin Parker, PA-C 04/14/2023 4:18 PM

## 2023-04-14 NOTE — Progress Notes (Addendum)
Subjective: Patient was better on rounds today.  Hemodynamically stable as of this morning with vasopressors weaned off overnight.  Continues to have diarrhea.  Enteric coccus bacteremia.  N/V is under control.  Objective: Vital signs in last 24 hours: Temp:  [98.1 F (36.7 C)-99.2 F (37.3 C)] 98.9 F (37.2 C) (12/20 0800) Pulse Rate:  [51-75] 59 (12/20 1023) Resp:  [15-25] 19 (12/20 1023) BP: (86-140)/(34-82) 99/50 (12/20 1015) SpO2:  [90 %-100 %] 99 % (12/20 1023)  Assessment/Plan:  # Enterococcal bacteremia #leukoytosis- resolved Agree with broad-spectrum ABX while awaiting sensitivities TTE pending and consideration of patient's AV replacement. WBC 19.6-->8.6 this am  #AKI PT Pink ostomy with clear yellow urine and mucus stranding. Continue to trend labs.  Interval improvement in serum creatinine overnight. Presumably prerenal d/t poor oral intake and GI volume loss.  Intake/Output from previous day: 12/19 0701 - 12/20 0700 In: 4704.1 [I.V.:2104.1; IV Piggyback:2600] Out: 1700 [Urine:1700]  Intake/Output this shift: Total I/O In: 1107.2 [I.V.:806.5; IV Piggyback:300.7] Out: 1200 [Urine:750; Stool:450]  Physical Exam:  General: Alert and oriented CV: No cyanosis Lungs: equal chest rise Gu: urostomy beefy pink with clear yellow urine and mucous stranding.    Lab Results: Recent Labs    04/13/23 1024 04/14/23 0250 04/14/23 0911  HGB 9.8* 7.2* 7.3*  HCT 31.8* 23.7* 24.8*   BMET Recent Labs    04/13/23 1024 04/14/23 0250  NA 132* 134*  K 4.2 3.4*  CL 99 104  CO2 19* 18*  GLUCOSE 150* 109*  BUN 33* 29*  CREATININE 2.42* 2.01*  CALCIUM 8.8* 7.7*     Studies/Results: CT ABDOMEN PELVIS WO CONTRAST Result Date: 04/13/2023 CLINICAL DATA:  Status post cystectomy on 03/03/2023 with pain and fever, initial encounter EXAM: CT ABDOMEN AND PELVIS WITHOUT CONTRAST TECHNIQUE: Multidetector CT imaging of the abdomen and pelvis was performed following the  standard protocol without IV contrast. RADIATION DOSE REDUCTION: This exam was performed according to the departmental dose-optimization program which includes automated exposure control, adjustment of the mA and/or kV according to patient size and/or use of iterative reconstruction technique. COMPARISON:  12/21/2022 FINDINGS: Lower chest: No acute abnormality. Hepatobiliary: Fatty infiltration of the liver is noted. Gallbladder has been surgically removed. Pancreas: Unremarkable. No pancreatic ductal dilatation or surrounding inflammatory changes. Spleen: Normal in size without focal abnormality. Adrenals/Urinary Tract: Adrenal glands are within normal limits. Kidneys demonstrate no renal calculi. Small exophytic cysts are noted in left kidney stable from the prior exam. No further follow-up is recommended. Mild fullness of the collecting systems is noted bilaterally. A ureteral stent is noted on the right. Interval cystectomy is noted with urostomy in the right lower quadrant. The ureteral stent on the right extends into the ostomy bag. Stomach/Bowel: Stomach is within normal limits. Small bowel shows no obstructive change. Scattered fluid and fecal material is noted throughout the colon. No obstructive changes are seen. Postsurgical changes in the right lower quadrant are noted consistent with creation of an ileal conduit Vascular/Lymphatic: Aortic atherosclerosis. No enlarged abdominal or pelvic lymph nodes. Reproductive: Prostate has been surgically removed during the cystectomy procedure. No fluid is noted in the operative bed. Other: No free fluid is noted.  No focal herniation is noted. Musculoskeletal: No acute or significant osseous findings. IMPRESSION: Changes consistent with history of prior cystectomy and prostatectomy with ileal conduit formation in the right mid abdomen. Mild fullness of the collecting systems is noted bilaterally. A right-sided nephroureteral stent is noted extending into the ostomy  bag. No evidence of postoperative abscess or extravasation is identified. No acute abnormality is seen. Electronically Signed   By: Alcide Clever M.D.   On: 04/13/2023 15:32   DG Chest Port 1 View Result Date: 04/13/2023 CLINICAL DATA:  Possible sepsis EXAM: PORTABLE CHEST 1 VIEW COMPARISON:  Chest x-ray October 20, 2021 FINDINGS: The cardiomediastinal silhouette is unchanged in contour status post median sternotomy. No focal pulmonary opacity. No pleural effusion or pneumothorax. The visualized upper abdomen is unremarkable. No acute osseous abnormality. IMPRESSION: No active disease. Electronically Signed   By: Jacob Moores M.D.   On: 04/13/2023 14:09      LOS: 1 day   Elmon Kirschner, NP [facility] Pager: [phone]  04/14/2023, 11:02 AM   I have seen and examined the paient and agree with Cameron's plan.    Greatly appreciate critical care care, ID, cardiology comnanagement of severe infection. Fortunately no surgical / drain indications.

## 2023-04-14 NOTE — Progress Notes (Signed)
eLink Physician-Brief Progress Note Patient Name: Luis [name] DOB: 09-02-1955 MRN: [id number]   Date of Service  04/14/2023  HPI/Events of Note  Notified by [facility] regarding bradycardia as low as in the 40s. Patient remains HDS with SBP in 160s. Currently resting with HR ranging from 40-50s  eICU Interventions  No atropine indicated at this point Continue to monitor on tele and trend BP BSRN to notify elink if patient becomes hypotensive  Current HR range acceptable     Intervention Category Intermediate Interventions: Arrhythmia - evaluation and management  [name] 04/14/2023, 10:27 PM

## 2023-04-14 NOTE — Progress Notes (Signed)
NAME:  Luis [name], MRN:  [id number], DOB:  08-14-1955, LOS: 1 ADMISSION DATE:  04/13/2023, CONSULTATION DATE:  04/13/23 REFERRING MD:  Dr. Elayne Snare, CHIEF COMPLAINT:  weakness   History of Present Illness:  2 yoM with PMH as below significant for bladder cancer (dx 2020 s/p TURBT and completion of 8 cycles of pembrolizumab (03/03/23) who was sent from urology office today with concern for sepsis.  Followed by Dr. [name] with oncology, scheduled to Tentatively scheduled to start gemcitabine and cisplatin after Christmas given positive margins and spread to lymph nodes next week.  Pt presenting from home with 2-3 weeks of progressive generalized weakness, decreased PO intake, and 28lb weight loss since under going total cystectomy with ileal conduit and prostatectomy Nov 8 w/ Dr. [name].  Wife reports they noticed an odor from ileal conduit when symptoms started.  Was eating normally but since gags at the smell of food with occasional non-bloody emesis. Also has complained of changing abd pain and been taking Pepto-bismo frequently.  No change in urine output.  Noticed low grade fever, tmax 100.4 and sleeping a lot, compliant with CPAP, no AMS, for several days but pt resistant to seek evaluation.  Pt denies HA, neck pain, CP, SOB, or diarrhea/ constipation.    In ER, initially normotensive with temp 100.5.   Labs Na 132, bicarb 19, BUN/ sCr 33/ 2.42 (previously 17/ 1.53 on 12/6), alk phos 136, WBC 19.6, Hgb 9.8 (previously 10.4), reassuring lactic 1.3> 1.1, INR 1.3, UA with mod Hgb, large leukocytes, > 50 WBC, 100 protein.  CXR without acute process.  CT a/p showed no evidence of postoperative abscess or extravasation or acute abnormality; existing right sided nephroureteral stent well positioned.  Urology consulting.  Pan-cultured and started on empiric vanc, cefepime, and flagyl.  1L NS given.  Pt developed SBP in the 80's, improved with additional 1L NS bolus but requiring low dose peripheral NE.  PCCM  consulted for admit.   Last took his medicines 12/18, including diuretics and coreg.  Taking tylenol only for pain.   Pertinent  Medical History  CAD, HFpEF, mod AS (biscuspid AV s/p AV replacement and replaced of ascending aorta 5/23), metastatic bladder cancer, OSA on CPAP, HTN, PAF off DOAC  Significant Hospital Events: Including procedures, antibiotic start and stop dates in addition to other pertinent events     Interim History / Subjective:   Off pressors Liquid stools, Flexi-Seal placed. Good urine output 1.7 L Afebrile Continues to feel weak, complains of nausea, no abdominal pain Objective   Blood pressure (!) 107/44, pulse (!) 55, temperature 98.1 F (36.7 C), temperature source Oral, resp. rate 19, height 5\' 6"  (1.676 m), weight 117.9 kg, SpO2 100%.        Intake/Output Summary (Last 24 hours) at 04/14/2023 0813 Last data filed at 04/14/2023 0708 Gross per 24 hour  Intake 5613.28 ml  Output 1700 ml  Net 3913.28 ml   Filed Weights   04/13/23 0951  Weight: 117.9 kg    Examination: General:  Older male lying in bed in NAD, initially sleeping  HEENT: MM pink/minimally moist, poor dentition Neuro: Alert, interactive, nonfocal CV: rr, bradycardia no murmur PULM: Clear, no accessory muscle use GI: obese, soft, bs+, NT, ileal conduit-Foley bag Extremities: warm/dry, trace ankle edema  Skin: no rashes   Labs show hypokalemia, mild hyponatremia, improved BUN/creatinine 29/2.0, peaked at 2.4, no leukocytosis, drop in hemoglobin from 9.8-7.2  Resolved Hospital Problem list    Assessment & Plan:  Septic shock -Enterococcus bacteremia , likely GI source Hypovolemia improved P:  -Off Levophed -Simplify antibiotics to ampicillin - antiemetics prn.  Sips for now, advance as tolerated   AKI, suspect pre-renal with poor PO intake, diuretic use  NAGMA, mild Hyponatremia - - trend renal indices  - strict I/Os, daily wts - avoid nephrotoxins, renal dose  meds -Hypokalemia will be repleted   Anemia, no evidence of blood loss, drop in hemoglobin from 10.4-7.2, some hemodilution -Recheck and transfuse for hemoglobin less than 7  Hx HTN, HFpEF - NSR to bradycardic in 50's - given hypotension, cont to hold home coreg, lasix   High risk for Protein calorie malnutrition - 28lb wt loss since 11/8 - maximize nutrition when able   OSA on CPAP - CPAP nightly    Metastatic Bladder cancer s/p total cystectomy with ileal conduit and prostatectomy Nov 8 w/ Dr. [name] w/urology - Urology following, appreciate input - followed by Dr. [name], f/u oncology outpt;  tentative plans to start gemcitabine and cisplatin    Depression/ anxiety - hold effexor- XR with AKI for now   HLD - resume crestor  Best Practice (right click and "Reselect all SmartList Selections" daily)   Diet/type: clear liquids DVT prophylaxis prophylactic heparin  Pressure ulcer(s): N/A GI prophylaxis: N/A Lines: N/A Foley:  N/A Code Status:  full code Last date of multidisciplinary goals of care discussion [12/19]  Wife, Claris Gladden updated at bedside, [phone]  Labs   CBC: Recent Labs  Lab 04/13/23 1024 04/14/23 0250  WBC 19.6* 8.6  NEUTROABS 17.7*  --   HGB 9.8* 7.2*  HCT 31.8* 23.7*  MCV 85.9 89.1  PLT 260 163    Basic Metabolic Panel: Recent Labs  Lab 04/13/23 1024 04/14/23 0250  NA 132* 134*  K 4.2 3.4*  CL 99 104  CO2 19* 18*  GLUCOSE 150* 109*  BUN 33* 29*  CREATININE 2.42* 2.01*  CALCIUM 8.8* 7.7*   GFR: Estimated Creatinine Clearance: 41.9 mL/min (A) (by C-G formula based on SCr of 2.01 mg/dL (H)). Recent Labs  Lab 04/13/23 1024 04/13/23 1032 04/13/23 1313 04/14/23 0250  WBC 19.6*  --   --  8.6  LATICACIDVEN  --  1.3 1.1  --     Liver Function Tests: Recent Labs  Lab 04/13/23 1024  AST 36  ALT 32  ALKPHOS 136*  BILITOT 1.0  PROT 8.9*  ALBUMIN 3.2*   No results for input(s): "LIPASE", "AMYLASE" in the last 168  hours. No results for input(s): "AMMONIA" in the last 168 hours.  ABG    Component Value Date/Time   PHART 7.323 (L) 03/03/2023 1144   PCO2ART 43.9 03/03/2023 1144   PO2ART 143 (H) 03/03/2023 1144   HCO3 22.8 03/03/2023 1144   TCO2 24 03/03/2023 1144   ACIDBASEDEF 3.0 (H) 03/03/2023 1144   O2SAT 99 03/03/2023 1144     Coagulation Profile: Recent Labs  Lab 04/13/23 1024  INR 1.3*    Cardiac Enzymes: No results for input(s): "CKTOTAL", "CKMB", "CKMBINDEX", "TROPONINI" in the last 168 hours.  HbA1C: Hgb A1c MFr Bld  Date/Time Value Ref Range Status  05/30/2022 12:52 PM 5.9 (H) 4.8 - 5.6 % Final    Comment:    (NOTE) Pre diabetes:          5.7%-6.4%  Diabetes:              >6.4%  Glycemic control for   <7.0% adults with diabetes   09/14/2021 11:33 AM 5.9 (H) 4.8 -  5.6 % Final    Comment:    (NOTE) Pre diabetes:          5.7%-6.4%  Diabetes:              >6.4%  Glycemic control for   <7.0% adults with diabetes     CBG: Recent Labs  Lab 04/13/23 1917 04/14/23 0010 04/14/23 0358 04/14/23 0741  GLUCAP 109* 102* 92 89    Cyril Mourning MD. FCCP. [facility] Pager : [phone]  If no response to pager , please call [phone] until 7 pm After 7:00 pm call Elink  [phone]     04/14/2023, 8:13 AM

## 2023-04-14 NOTE — Plan of Care (Signed)
  Problem: Clinical Measurements: Goal: Diagnostic test results will improve Outcome: Progressing Goal: Respiratory complications will improve Outcome: Progressing Goal: Cardiovascular complication will be avoided Outcome: Progressing   Problem: Activity: Goal: Risk for activity intolerance will decrease Outcome: Progressing   Problem: Nutrition: Goal: Adequate nutrition will be maintained Outcome: Progressing   Problem: Coping: Goal: Level of anxiety will decrease Outcome: Progressing   Problem: Elimination: Goal: Will not experience complications related to urinary retention Outcome: Progressing   Problem: Elimination: Goal: Will not experience complications related to bowel motility Outcome: Not Progressing

## 2023-04-15 ENCOUNTER — Inpatient Hospital Stay (HOSPITAL_COMMUNITY): Payer: PPO

## 2023-04-15 DIAGNOSIS — R579 Shock, unspecified: Secondary | ICD-10-CM | POA: Diagnosis not present

## 2023-04-15 DIAGNOSIS — B952 Enterococcus as the cause of diseases classified elsewhere: Secondary | ICD-10-CM | POA: Diagnosis not present

## 2023-04-15 DIAGNOSIS — R6521 Severe sepsis with septic shock: Secondary | ICD-10-CM | POA: Diagnosis not present

## 2023-04-15 DIAGNOSIS — Z954 Presence of other heart-valve replacement: Secondary | ICD-10-CM

## 2023-04-15 DIAGNOSIS — G4733 Obstructive sleep apnea (adult) (pediatric): Secondary | ICD-10-CM | POA: Diagnosis not present

## 2023-04-15 DIAGNOSIS — R7881 Bacteremia: Secondary | ICD-10-CM | POA: Diagnosis not present

## 2023-04-15 DIAGNOSIS — A419 Sepsis, unspecified organism: Secondary | ICD-10-CM | POA: Diagnosis not present

## 2023-04-15 DIAGNOSIS — Z952 Presence of prosthetic heart valve: Secondary | ICD-10-CM | POA: Diagnosis not present

## 2023-04-15 DIAGNOSIS — Z2239 Carrier of other specified bacterial diseases: Secondary | ICD-10-CM

## 2023-04-15 DIAGNOSIS — R001 Bradycardia, unspecified: Secondary | ICD-10-CM | POA: Diagnosis not present

## 2023-04-15 DIAGNOSIS — N179 Acute kidney failure, unspecified: Secondary | ICD-10-CM | POA: Diagnosis not present

## 2023-04-15 LAB — CBC WITH DIFFERENTIAL/PLATELET
Abs Immature Granulocytes: 0.02 10*3/uL (ref 0.00–0.07)
Basophils Absolute: 0 10*3/uL (ref 0.0–0.1)
Basophils Relative: 0 %
Eosinophils Absolute: 0.2 10*3/uL (ref 0.0–0.5)
Eosinophils Relative: 4 %
HCT: 25.8 % — ABNORMAL LOW (ref 39.0–52.0)
Hemoglobin: 7.9 g/dL — ABNORMAL LOW (ref 13.0–17.0)
Immature Granulocytes: 0 %
Lymphocytes Relative: 20 %
Lymphs Abs: 1.1 10*3/uL (ref 0.7–4.0)
MCH: 27.1 pg (ref 26.0–34.0)
MCHC: 30.6 g/dL (ref 30.0–36.0)
MCV: 88.4 fL (ref 80.0–100.0)
Monocytes Absolute: 0.7 10*3/uL (ref 0.1–1.0)
Monocytes Relative: 13 %
Neutro Abs: 3.3 10*3/uL (ref 1.7–7.7)
Neutrophils Relative %: 63 %
Platelets: 146 10*3/uL — ABNORMAL LOW (ref 150–400)
RBC: 2.92 MIL/uL — ABNORMAL LOW (ref 4.22–5.81)
RDW: 15.4 % (ref 11.5–15.5)
WBC: 5.3 10*3/uL (ref 4.0–10.5)
nRBC: 0 % (ref 0.0–0.2)

## 2023-04-15 LAB — ECHOCARDIOGRAM COMPLETE
AR max vel: 1.96 cm2
AV Area VTI: 2.16 cm2
AV Area mean vel: 1.83 cm2
AV Mean grad: 7.5 mm[Hg]
AV Peak grad: 17.3 mm[Hg]
Ao pk vel: 2.08 m/s
Area-P 1/2: 3.12 cm2
Calc EF: 77.1 %
Height: 66 in
MV VTI: 1.81 cm2
S' Lateral: 3.4 cm
Single Plane A2C EF: 78.3 %
Single Plane A4C EF: 77.4 %
Weight: 4306.91 [oz_av]

## 2023-04-15 LAB — GLUCOSE, CAPILLARY
Glucose-Capillary: 100 mg/dL — ABNORMAL HIGH (ref 70–99)
Glucose-Capillary: 108 mg/dL — ABNORMAL HIGH (ref 70–99)
Glucose-Capillary: 113 mg/dL — ABNORMAL HIGH (ref 70–99)
Glucose-Capillary: 117 mg/dL — ABNORMAL HIGH (ref 70–99)
Glucose-Capillary: 120 mg/dL — ABNORMAL HIGH (ref 70–99)
Glucose-Capillary: 82 mg/dL (ref 70–99)
Glucose-Capillary: 98 mg/dL (ref 70–99)

## 2023-04-15 LAB — BASIC METABOLIC PANEL
Anion gap: 8 (ref 5–15)
BUN: 21 mg/dL (ref 8–23)
CO2: 23 mmol/L (ref 22–32)
Calcium: 8 mg/dL — ABNORMAL LOW (ref 8.9–10.3)
Chloride: 106 mmol/L (ref 98–111)
Creatinine, Ser: 1.44 mg/dL — ABNORMAL HIGH (ref 0.61–1.24)
GFR, Estimated: 53 mL/min — ABNORMAL LOW (ref >60)
Glucose, Bld: 129 mg/dL — ABNORMAL HIGH (ref 70–99)
Potassium: 3.4 mmol/L — ABNORMAL LOW (ref 3.5–5.1)
Sodium: 137 mmol/L (ref 135–145)

## 2023-04-15 LAB — URINE CULTURE: Culture: >=100000 — AB

## 2023-04-15 LAB — PHOSPHORUS: Phosphorus: 2.5 mg/dL (ref 2.5–4.6)

## 2023-04-15 LAB — MAGNESIUM: Magnesium: 1.8 mg/dL (ref 1.7–2.4)

## 2023-04-15 MED ORDER — PERFLUTREN LIPID MICROSPHERE
1.0000 mL | INTRAVENOUS | Status: AC | PRN
  Administered 2023-04-15: 4 mL via INTRAVENOUS

## 2023-04-15 MED ORDER — POTASSIUM CHLORIDE CRYS ER 20 MEQ PO TBCR
20.0000 meq | EXTENDED_RELEASE_TABLET | Freq: Once | ORAL | Status: AC
  Administered 2023-04-15: 20 meq via ORAL
  Filled 2023-04-15: qty 1

## 2023-04-15 NOTE — Progress Notes (Signed)
NAME:  Luis [name], MRN:  [id number], DOB:  [birth year], LOS: 2 ADMISSION DATE:  04/13/2023, CONSULTATION DATE:  04/13/23 REFERRING MD:  Dr. Elayne Snare, CHIEF COMPLAINT:  weakness   History of Present Illness:  89 yoM with PMH as below significant for bladder cancer (dx 2020 s/p TURBT and completion of 8 cycles of pembrolizumab (03/03/23) who was sent from urology office today with concern for sepsis.  Followed by Dr. [name] with oncology, scheduled to Tentatively scheduled to start gemcitabine and cisplatin after Christmas given positive margins and spread to lymph nodes next week.  Pt presenting from home with 2-3 weeks of progressive generalized weakness, decreased PO intake, and 28lb weight loss since under going total cystectomy with ileal conduit and prostatectomy Nov 8 w/ Dr. [name].  Wife reports they noticed an odor from ileal conduit when symptoms started.  Was eating normally but since gags at the smell of food with occasional non-bloody emesis. Also has complained of changing abd pain and been taking Pepto-bismo frequently.  No change in urine output.  Noticed low grade fever, tmax 100.4 and sleeping a lot, compliant with CPAP, no AMS, for several days but pt resistant to seek evaluation.  Pt denies HA, neck pain, CP, SOB, or diarrhea/ constipation.    In ER, initially normotensive with temp 100.5.   Labs Na 132, bicarb 19, BUN/ sCr 33/ 2.42 (previously 17/ 1.53 on 12/6), alk phos 136, WBC 19.6, Hgb 9.8 (previously 10.4), reassuring lactic 1.3> 1.1, INR 1.3, UA with mod Hgb, large leukocytes, > 50 WBC, 100 protein.  CXR without acute process.  CT a/p showed no evidence of postoperative abscess or extravasation or acute abnormality; existing right sided nephroureteral stent well positioned.  Urology consulting.  Pan-cultured and started on empiric vanc, cefepime, and flagyl.  1L NS given.  Pt developed SBP in the 80's, improved with additional 1L NS bolus but requiring low dose peripheral NE.  PCCM  consulted for admit.   Last took his medicines 12/18, including diuretics and coreg.  Taking tylenol only for pain.   Pertinent  Medical History  CAD, HFpEF, mod AS (biscuspid AV s/p AV replacement and replaced of ascending aorta 5/23), metastatic bladder cancer, OSA on CPAP, HTN, PAF off DOAC  Significant Hospital Events: Including procedures, antibiotic start and stop dates in addition to other pertinent events   12/20 added midodrine, 1 unit PRBC  Interim History / Subjective:   Bradycardic last 24 hours Off Levophed this morning Able to tolerate oral  Objective   Blood pressure (!) 102/49, pulse (!) 55, temperature 98.1 F (36.7 C), temperature source Oral, resp. rate (!) 22, height 5\' 6"  (1.676 m), weight 122.1 kg, SpO2 96%.        Intake/Output Summary (Last 24 hours) at 04/15/2023 0847 Last data filed at 04/15/2023 0800 Gross per 24 hour  Intake 1067.24 ml  Output 4935 ml  Net -3867.76 ml   Filed Weights   04/13/23 0951 04/14/23 0703 04/15/23 0500  Weight: 117.9 kg 124 kg 122.1 kg    Examination: General:  Older male lying in bed in NAD, HEENT: MM pink/minimally moist, poor dentition Neuro: Alert, interactive, nonfocal CV: rr, bradycardia no murmur PULM: Clear, no accessory muscle use GI: obese, soft, bs+, NT, ileal conduit-pink mucosa, Foley bag Extremities: warm/dry, trace ankle edema  Skin: no rashes   Hemoglobin improved from 7.2-8.4 posttransfusion Fecal occult blood negative  Resolved Hospital Problem list    Assessment & Plan:   Septic shock -Enterococcus bacteremia ,  likely GI source Hypovolemia improved P:  -Off Levophed -Simplify antibiotics to ampicillin/ceftriaxone -TEE planned per ID   AKI, suspect pre-renal with poor PO intake, diuretic use  NAGMA, mild Hyponatremia - - trend renal indices  - strict I/Os, daily wts - avoid nephrotoxins, renal dose meds -Await labs today   Anemia, no evidence of blood loss, drop in hemoglobin  from 10.4-7.2, some hemodilution -Status post 1 unit PRBC 12/20  Hx HTN, HFpEF Persistent bradycardia -DC midodrine  -cont to hold home coreg, lasix   OSA on CPAP - CPAP nightly    Metastatic Bladder cancer s/p total cystectomy with ileal conduit and prostatectomy Nov 8 w/ Dr. [name] w/urology - Urology following, appreciate input - followed by Dr. [name], f/u oncology outpt;  tentative plans to start gemcitabine and cisplatin    Depression/ anxiety - hold effexor- XR with AKI for now   PCCM available as needed  Best Practice (right click and "Reselect all SmartList Selections" daily)   Diet/type: clear liquids DVT prophylaxis prophylactic heparin  Pressure ulcer(s): N/A GI prophylaxis: N/A Lines: N/A Foley:  N/A Code Status:  full code Last date of multidisciplinary goals of care discussion [12/19]  Wife, Claris Gladden updated at bedside, [phone]  Labs   CBC: Recent Labs  Lab 04/13/23 1024 04/14/23 0250 04/14/23 0911 04/14/23 1901  WBC 19.6* 8.6  --   --   NEUTROABS 17.7*  --   --   --   HGB 9.8* 7.2* 7.3* 8.4*  HCT 31.8* 23.7* 24.8* 27.5*  MCV 85.9 89.1  --   --   PLT 260 163  --   --     Basic Metabolic Panel: Recent Labs  Lab 04/13/23 1024 04/14/23 0250  NA 132* 134*  K 4.2 3.4*  CL 99 104  CO2 19* 18*  GLUCOSE 150* 109*  BUN 33* 29*  CREATININE 2.42* 2.01*  CALCIUM 8.8* 7.7*   GFR: Estimated Creatinine Clearance: 42.7 mL/min (A) (by C-G formula based on SCr of 2.01 mg/dL (H)). Recent Labs  Lab 04/13/23 1024 04/13/23 1032 04/13/23 1313 04/14/23 0250  WBC 19.6*  --   --  8.6  LATICACIDVEN  --  1.3 1.1  --     Liver Function Tests: Recent Labs  Lab 04/13/23 1024  AST 36  ALT 32  ALKPHOS 136*  BILITOT 1.0  PROT 8.9*  ALBUMIN 3.2*   No results for input(s): "LIPASE", "AMYLASE" in the last 168 hours. No results for input(s): "AMMONIA" in the last 168 hours.  ABG    Component Value Date/Time   PHART 7.323 (L) 03/03/2023 1144    PCO2ART 43.9 03/03/2023 1144   PO2ART 143 (H) 03/03/2023 1144   HCO3 22.8 03/03/2023 1144   TCO2 24 03/03/2023 1144   ACIDBASEDEF 3.0 (H) 03/03/2023 1144   O2SAT 99 03/03/2023 1144     Coagulation Profile: Recent Labs  Lab 04/13/23 1024  INR 1.3*    Cardiac Enzymes: No results for input(s): "CKTOTAL", "CKMB", "CKMBINDEX", "TROPONINI" in the last 168 hours.  HbA1C: Hgb A1c MFr Bld  Date/Time Value Ref Range Status  05/30/2022 12:52 PM 5.9 (H) 4.8 - 5.6 % Final    Comment:    (NOTE) Pre diabetes:          5.7%-6.4%  Diabetes:              >6.4%  Glycemic control for   <7.0% adults with diabetes   09/14/2021 11:33 AM 5.9 (H) 4.8 - 5.6 % Final  Comment:    (NOTE) Pre diabetes:          5.7%-6.4%  Diabetes:              >6.4%  Glycemic control for   <7.0% adults with diabetes     CBG: Recent Labs  Lab 04/14/23 1612 04/14/23 1925 04/15/23 0010 04/15/23 0346 04/15/23 0730  GLUCAP 151* 134* 98 100* 113*    Cyril Mourning MD. FCCP. [facility] Pager : [phone]  If no response to pager , please call [phone] until 7 pm After 7:00 pm call Elink  [phone]     04/15/2023, 8:47 AM

## 2023-04-15 NOTE — H&P (View-Only) (Signed)
Subjective: No new complaints   Antibiotics:  Anti-infectives (From admission, onward)    Start     Dose/Rate Route Frequency Ordered Stop   04/14/23 1030  cefTRIAXone (ROCEPHIN) 2 g in sodium chloride 0.9 % 100 mL IVPB        2 g 200 mL/hr over 30 Minutes Intravenous Every 12 hours 04/14/23 0938     04/14/23 0030  ampicillin (OMNIPEN) 2 g in sodium chloride 0.9 % 100 mL IVPB        2 g 300 mL/hr over 20 Minutes Intravenous Every 6 hours 04/13/23 2332     04/13/23 2300  ceFEPIme (MAXIPIME) 2 g in sodium chloride 0.9 % 100 mL IVPB  Status:  Discontinued        2 g 200 mL/hr over 30 Minutes Intravenous Every 12 hours 04/13/23 1727 04/13/23 2331   04/13/23 1100  vancomycin (VANCOREADY) IVPB 2000 mg/400 mL        2,000 mg 200 mL/hr over 120 Minutes Intravenous  Once 04/13/23 1025 04/13/23 1354   04/13/23 1015  ceFEPIme (MAXIPIME) 2 g in sodium chloride 0.9 % 100 mL IVPB        2 g 200 mL/hr over 30 Minutes Intravenous  Once 04/13/23 1014 04/13/23 1140   04/13/23 1015  metroNIDAZOLE (FLAGYL) IVPB 500 mg        500 mg 100 mL/hr over 60 Minutes Intravenous  Once 04/13/23 1014 04/13/23 1215   04/13/23 1015  vancomycin (VANCOCIN) IVPB 1000 mg/200 mL premix  Status:  Discontinued        1,000 mg 200 mL/hr over 60 Minutes Intravenous  Once 04/13/23 1014 04/13/23 1025       Medications: Scheduled Meds:  aspirin EC  81 mg Oral Daily   Chlorhexidine Gluconate Cloth  6 each Topical Daily   feeding supplement  1 Container Oral TID BM   rosuvastatin  10 mg Oral Daily   venlafaxine XR  150 mg Oral Q breakfast   Continuous Infusions:  ampicillin (OMNIPEN) IV 300 mL/hr at 04/15/23 1113   cefTRIAXone (ROCEPHIN)  IV Stopped (04/15/23 0921)   PRN Meds:.acetaminophen, docusate sodium, ondansetron (ZOFRAN) IV, [START ON 04/16/2023] pneumococcal 20-valent conjugate vaccine, polyethylene glycol    Objective: Weight change: 6.065 kg  Intake/Output Summary (Last 24 hours) at  04/15/2023 1659 Last data filed at 04/15/2023 1600 Gross per 24 hour  Intake 1141.18 ml  Output 5135 ml  Net -3993.82 ml   Blood pressure (!) 129/59, pulse (!) 56, temperature 97.8 F (36.6 C), temperature source Oral, resp. rate (!) 22, height 5\' 6"  (1.676 m), weight 122.1 kg, SpO2 100%. Temp:  [97.8 F (36.6 C)-98.7 F (37.1 C)] 97.8 F (36.6 C) (12/21 1600) Pulse Rate:  [42-66] 56 (12/21 1300) Resp:  [8-22] 22 (12/21 1300) BP: (81-168)/(34-71) 129/59 (12/21 1300) SpO2:  [85 %-100 %] 100 % (12/21 1300) Weight:  [122.1 kg] 122.1 kg (12/21 0500)  Physical Exam: Physical Exam Constitutional:      Appearance: He is well-developed.  HENT:     Head: Normocephalic and atraumatic.  Eyes:     Conjunctiva/sclera: Conjunctivae normal.  Cardiovascular:     Rate and Rhythm: Normal rate and regular rhythm.  Pulmonary:     Effort: Pulmonary effort is normal. No respiratory distress.     Breath sounds: No wheezing.  Abdominal:     General: There is no distension.     Palpations: Abdomen is soft.  Musculoskeletal:  General: Normal range of motion.     Cervical back: Normal range of motion and neck supple.  Skin:    General: Skin is warm and dry.     Findings: No erythema or rash.  Neurological:     General: No focal deficit present.     Mental Status: He is alert and oriented to person, place, and time.  Psychiatric:        Mood and Affect: Mood normal.        Behavior: Behavior normal.        Thought Content: Thought content normal.        Judgment: Judgment normal.     Ileal conduit is clean  CBC:    BMET Recent Labs    04/14/23 0250 04/15/23 0928  NA 134* 137  K 3.4* 3.4*  CL 104 106  CO2 18* 23  GLUCOSE 109* 129*  BUN 29* 21  CREATININE 2.01* 1.44*  CALCIUM 7.7* 8.0*     Liver Panel  Recent Labs    04/13/23 1024  PROT 8.9*  ALBUMIN 3.2*  AST 36  ALT 32  ALKPHOS 136*  BILITOT 1.0       Sedimentation Rate No results for input(s):  "ESRSEDRATE" in the last 72 hours. C-Reactive Protein No results for input(s): "CRP" in the last 72 hours.  Micro Results: Recent Results (from the past 720 hours)  Resp panel by RT-PCR (RSV, Flu A&B, Covid) Anterior Nasal Swab     Status: None   Collection Time: 04/13/23 10:14 AM   Specimen: Anterior Nasal Swab  Result Value Ref Range Status   SARS Coronavirus 2 by RT PCR NEGATIVE NEGATIVE Final    Comment: (NOTE) SARS-CoV-2 target nucleic acids are NOT DETECTED.  The SARS-CoV-2 RNA is generally detectable in upper respiratory specimens during the acute phase of infection. The lowest concentration of SARS-CoV-2 viral copies this assay can detect is 138 copies/mL. A negative result does not preclude SARS-Cov-2 infection and should not be used as the sole basis for treatment or other patient management decisions. A negative result may occur with  improper specimen collection/handling, submission of specimen other than nasopharyngeal swab, presence of viral mutation(s) within the areas targeted by this assay, and inadequate number of viral copies(<138 copies/mL). A negative result must be combined with clinical observations, patient history, and epidemiological information. The expected result is Negative.  Fact Sheet for Patients:  BloggerCourse.com  Fact Sheet for Healthcare Providers:  SeriousBroker.it  This test is no t yet approved or cleared by the [place] FDA and  has been authorized for detection and/or diagnosis of SARS-CoV-2 by FDA under an Emergency Use Authorization (EUA). This EUA will remain  in effect (meaning this test can be used) for the duration of the COVID-19 declaration under Section 564(b)(1) of the Act, 21 U.S.C.section 360bbb-3(b)(1), unless the authorization is terminated  or revoked sooner.       Influenza A by PCR NEGATIVE NEGATIVE Final   Influenza B by PCR NEGATIVE NEGATIVE Final     Comment: (NOTE) The Xpert Xpress SARS-CoV-2/FLU/RSV plus assay is intended as an aid in the diagnosis of influenza from Nasopharyngeal swab specimens and should not be used as a sole basis for treatment. Nasal washings and aspirates are unacceptable for Xpert Xpress SARS-CoV-2/FLU/RSV testing.  Fact Sheet for Patients: BloggerCourse.com  Fact Sheet for Healthcare Providers: SeriousBroker.it  This test is not yet approved or cleared by the [place] FDA and has been authorized for detection and/or diagnosis  of SARS-CoV-2 by FDA under an Emergency Use Authorization (EUA). This EUA will remain in effect (meaning this test can be used) for the duration of the COVID-19 declaration under Section 564(b)(1) of the Act, 21 U.S.C. section 360bbb-3(b)(1), unless the authorization is terminated or revoked.     Resp Syncytial Virus by PCR NEGATIVE NEGATIVE Final    Comment: (NOTE) Fact Sheet for Patients: BloggerCourse.com  Fact Sheet for Healthcare Providers: SeriousBroker.it  This test is not yet approved or cleared by the [place] FDA and has been authorized for detection and/or diagnosis of SARS-CoV-2 by FDA under an Emergency Use Authorization (EUA). This EUA will remain in effect (meaning this test can be used) for the duration of the COVID-19 declaration under Section 564(b)(1) of the Act, 21 U.S.C. section 360bbb-3(b)(1), unless the authorization is terminated or revoked.  Performed at [facility], [address] [place], [place] [address]   Urine Culture     Status: Abnormal   Collection Time: 04/13/23 10:14 AM   Specimen: Urine, Random  Result Value Ref Range Status   Specimen Description   Final    URINE, RANDOM Performed at [facility], [address] [place], [place] [address]    Special Requests   Final    NONE Reflexed  from [id number] Performed at [facility], [address] [place], [place] [address]    Culture (A)  Final    >=100,000 COLONIES/mL ENTEROCOCCUS FAECALIS >=100,000 COLONIES/mL STENOTROPHOMONAS MALTOPHILIA    Report Status 04/15/2023 FINAL  Final   Organism ID, Bacteria ENTEROCOCCUS FAECALIS (A)  Final   Organism ID, Bacteria STENOTROPHOMONAS MALTOPHILIA (A)  Final      Susceptibility   Enterococcus faecalis - MIC*    AMPICILLIN <=2 SENSITIVE Sensitive     NITROFURANTOIN <=16 SENSITIVE Sensitive     VANCOMYCIN 1 SENSITIVE Sensitive     * >=100,000 COLONIES/mL ENTEROCOCCUS FAECALIS   Stenotrophomonas maltophilia - MIC*    LEVOFLOXACIN 0.25 SENSITIVE Sensitive     TRIMETH/SULFA <=20 SENSITIVE Sensitive     * >=100,000 COLONIES/mL STENOTROPHOMONAS MALTOPHILIA  Blood Culture (routine x 2)     Status: Abnormal (Preliminary result)   Collection Time: 04/13/23 10:24 AM   Specimen: BLOOD  Result Value Ref Range Status   Specimen Description   Final    BLOOD RIGHT ANTECUBITAL Performed at [facility], [address] [place], [place] [address]    Special Requests   Final    BOTTLES DRAWN AEROBIC AND ANAEROBIC Blood Culture adequate volume Performed at [facility], [address] [place], [place] [address]    Culture  Setup Time   Final    GRAM POSITIVE COCCI IN CHAINS IN BOTH AEROBIC AND ANAEROBIC BOTTLES CRITICAL RESULT CALLED TO, READ BACK BY AND VERIFIED WITH: PHARMD L. Bernarda Caffey [id number] @ 2313 FH    Culture (A)  Final    ENTEROCOCCUS FAECALIS SUSCEPTIBILITIES TO FOLLOW Performed at [facility], [address] [place], [place] [address]    Report Status PENDING  Incomplete  Blood Culture ID Panel (Reflexed)     Status: Abnormal   Collection Time: 04/13/23 10:24 AM  Result Value Ref Range Status   Enterococcus faecalis DETECTED (A) NOT DETECTED Final    Comment: CRITICAL RESULT CALLED TO, READ BACK BY AND  VERIFIED WITH: PHARMD L. POINTDEXTER [id number] @ 2313 FH    Enterococcus Faecium NOT DETECTED NOT DETECTED Final   Listeria monocytogenes NOT DETECTED NOT  DETECTED Final   Staphylococcus species NOT DETECTED NOT DETECTED Final   Staphylococcus aureus (BCID) NOT DETECTED NOT DETECTED Final   Staphylococcus epidermidis NOT DETECTED NOT DETECTED Final   Staphylococcus lugdunensis NOT DETECTED NOT DETECTED Final   Streptococcus species NOT DETECTED NOT DETECTED Final   Streptococcus agalactiae NOT DETECTED NOT DETECTED Final   Streptococcus pneumoniae NOT DETECTED NOT DETECTED Final   Streptococcus pyogenes NOT DETECTED NOT DETECTED Final   A.calcoaceticus-baumannii NOT DETECTED NOT DETECTED Final   Bacteroides fragilis NOT DETECTED NOT DETECTED Final   Enterobacterales NOT DETECTED NOT DETECTED Final   Enterobacter cloacae complex NOT DETECTED NOT DETECTED Final   Escherichia coli NOT DETECTED NOT DETECTED Final   Klebsiella aerogenes NOT DETECTED NOT DETECTED Final   Klebsiella oxytoca NOT DETECTED NOT DETECTED Final   Klebsiella pneumoniae NOT DETECTED NOT DETECTED Final   Proteus species NOT DETECTED NOT DETECTED Final   Salmonella species NOT DETECTED NOT DETECTED Final   Serratia marcescens NOT DETECTED NOT DETECTED Final   Haemophilus influenzae NOT DETECTED NOT DETECTED Final   Neisseria meningitidis NOT DETECTED NOT DETECTED Final   Pseudomonas aeruginosa NOT DETECTED NOT DETECTED Final   Stenotrophomonas maltophilia NOT DETECTED NOT DETECTED Final   Candida albicans NOT DETECTED NOT DETECTED Final   Candida auris NOT DETECTED NOT DETECTED Final   Candida glabrata NOT DETECTED NOT DETECTED Final   Candida krusei NOT DETECTED NOT DETECTED Final   Candida parapsilosis NOT DETECTED NOT DETECTED Final   Candida tropicalis NOT DETECTED NOT DETECTED Final   Cryptococcus neoformans/gattii NOT DETECTED NOT DETECTED Final   Vancomycin resistance NOT DETECTED NOT DETECTED Final     Comment: Performed at [facility], [address] [place], [place] [address]  Blood Culture (routine x 2)     Status: Abnormal (Preliminary result)   Collection Time: 04/13/23 10:27 AM   Specimen: BLOOD LEFT HAND  Result Value Ref Range Status   Specimen Description   Final    BLOOD LEFT HAND Performed at [facility], [address] [place], [place] [address]    Special Requests   Final    BOTTLES DRAWN AEROBIC AND ANAEROBIC Blood Culture adequate volume Performed at [facility], [address] [place], [place] [address]    Culture  Setup Time   Final    GRAM POSITIVE COCCI IN CHAINS IN BOTH AEROBIC AND ANAEROBIC BOTTLES Performed at [facility], [address] [place], [place] [address]    Culture ENTEROCOCCUS FAECALIS (A)  Final   Report Status PENDING  Incomplete  MRSA Next Gen by PCR, Nasal     Status: None   Collection Time: 04/13/23  5:27 PM   Specimen: Nasal Mucosa; Nasal Swab  Result Value Ref Range Status   MRSA by PCR Next Gen NOT DETECTED NOT DETECTED Final    Comment: (NOTE) The GeneXpert MRSA Assay (FDA approved for NASAL specimens only), is one component of a comprehensive MRSA colonization surveillance program. It is not intended to diagnose MRSA infection nor to guide or monitor treatment for MRSA infections. Test performance is not FDA approved in patients less than 21 years old. Performed at [facility], [address], [place], [place] [address]   Culture, blood (Routine X 2) w Reflex to ID Panel     Status: None (Preliminary result)   Collection Time: 04/15/23  8:16 AM   Specimen: BLOOD RIGHT ARM  Result Value Ref Range Status  Specimen Description   Final    BLOOD RIGHT ARM Performed at [facility], [address], [place], [place] [address]    Special Requests   Final    BOTTLES DRAWN AEROBIC AND ANAEROBIC Blood Culture results may not be optimal due to an inadequate volume of  blood received in culture bottles Performed at [facility], [address], [place], [place] [address]    Culture PENDING  Incomplete   Report Status PENDING  Incomplete  Culture, blood (Routine X 2) w Reflex to ID Panel     Status: None (Preliminary result)   Collection Time: 04/15/23  8:29 AM   Specimen: BLOOD RIGHT HAND  Result Value Ref Range Status   Specimen Description   Final    BLOOD RIGHT HAND Performed at [facility], [address], [place], [place] [address]    Special Requests   Final    BOTTLES DRAWN AEROBIC AND ANAEROBIC Blood Culture results may not be optimal due to an inadequate volume of blood received in culture bottles Performed at [facility], [address] [place], [place] [address]    Culture PENDING  Incomplete   Report Status PENDING  Incomplete    Studies/Results: ECHOCARDIOGRAM COMPLETE Result Date: 04/15/2023    ECHOCARDIOGRAM REPORT   Patient Name:   KHALEEM SOGGE Date of Exam: 04/15/2023 Medical Rec #:  [id number]        Height:       66.0 in Accession #:    [id number]       Weight:       269.2 lb Date of Birth:  1956-01-26        BSA:          2.269 m Patient Age:    67 years         BP:           103/54 mmHg Patient Gender: M                HR:           57 bpm. Exam Location:  Inpatient Procedure: 2D Echo, Cardiac Doppler, Color Doppler and Intracardiac            Opacification Agent Indications:    Bacteremia  History:        Patient has prior history of Echocardiogram examinations, most                 recent 11/24/2022. CHF, CAD and Previous Myocardial Infarction,                 Arrythmias:Atrial Fibrillation, Signs/Symptoms:Dyspnea; Risk                 Factors:Hypertension, Dyslipidemia and Sleep Apnea.                 Aortic Valve: 25 mm Edwards Inspiris Resilia valve is present in                 the aortic position.  Sonographer:    Vern Claude Referring Phys: [id number] Blair Mesina N VAN DAM IMPRESSIONS  1.  Left ventricular ejection fraction, by estimation, is 65 to 70%. The left ventricle has normal function. The left ventricle has no regional wall motion abnormalities. There is mild left ventricular hypertrophy. Left ventricular diastolic parameters are indeterminate.  2. Right ventricular systolic function is normal. The right ventricular size is normal. Tricuspid regurgitation signal is inadequate for assessing PA  pressure.  3. The mitral valve is normal in structure. Trivial mitral valve regurgitation. No evidence of mitral stenosis.  4. The aortic valve has been repaired/replaced. Aortic valve regurgitation is not visualized. There is a 25 mm Edwards Inspiris Resilia valve present in the aortic position. Vmax 2.2 m/s, MG , EOA 1.9 cm^2 Conclusion(s)/Recommendation(s): No evidence of valvular vegetations on this transthoracic echocardiogram, though poor quality study Consider a transesophageal echocardiogram to exclude infective endocarditis if clinically indicated. FINDINGS  Left Ventricle: Left ventricular ejection fraction, by estimation, is 65 to 70%. The left ventricle has normal function. The left ventricle has no regional wall motion abnormalities. The left ventricular internal cavity size was normal in size. There is  mild left ventricular hypertrophy. Left ventricular diastolic parameters are indeterminate. Right Ventricle: The right ventricular size is normal. No increase in right ventricular wall thickness. Right ventricular systolic function is normal. Tricuspid regurgitation signal is inadequate for assessing PA pressure. Left Atrium: Left atrial size was normal in size. Right Atrium: Right atrial size was normal in size. Pericardium: Trivial pericardial effusion is present. Mitral Valve: The mitral valve is normal in structure. Trivial mitral valve regurgitation. No evidence of mitral valve stenosis. MV peak gradient, 6.2 mmHg. The mean mitral valve gradient is 2.0 mmHg. Tricuspid Valve: The  tricuspid valve is normal in structure. Tricuspid valve regurgitation is trivial. Aortic Valve: The aortic valve has been repaired/replaced. Aortic valve regurgitation is not visualized. Aortic valve mean gradient measures 7.5 mmHg. Aortic valve peak gradient measures 17.3 mmHg. Aortic valve area, by VTI measures 2.16 cm. There is a 25 mm Edwards Inspiris Resilia valve present in the aortic position. Pulmonic Valve: The pulmonic valve was not well visualized. Pulmonic valve regurgitation is trivial. Aorta: The aortic root and ascending aorta are structurally normal, with no evidence of dilitation. IAS/Shunts: The interatrial septum was not well visualized.  LEFT VENTRICLE PLAX 2D LVIDd:         4.90 cm      Diastology LVIDs:         3.40 cm      LV e' medial:    6.42 cm/s LV PW:         1.20 cm      LV E/e' medial:  17.4 LV IVS:        1.00 cm      LV e' lateral:   17.30 cm/s LVOT diam:     1.70 cm      LV E/e' lateral: 6.5 LV SV:         81 LV SV Index:   36 LVOT Area:     2.27 cm  LV Volumes (MOD) LV vol d, MOD A2C: 143.0 ml LV vol d, MOD A4C: 214.0 ml LV vol s, MOD A2C: 31.1 ml LV vol s, MOD A4C: 48.3 ml LV SV MOD A2C:     111.9 ml LV SV MOD A4C:     214.0 ml LV SV MOD BP:      135.1 ml RIGHT VENTRICLE RV Basal diam:  3.70 cm RV Mid diam:    2.90 cm RV S prime:     14.50 cm/s TAPSE (M-mode): 2.9 cm LEFT ATRIUM             Index        RIGHT ATRIUM           Index LA diam:        3.90 cm 1.72 cm/m   RA Area:  20.50 cm LA Vol (A2C):   52.2 ml 23.01 ml/m  RA Volume:   55.20 ml  24.33 ml/m LA Vol (A4C):   50.1 ml 22.08 ml/m LA Biplane Vol: 51.2 ml 22.57 ml/m  AORTIC VALVE                     PULMONIC VALVE AV Area (Vmax):    1.96 cm      PV Vmax:       1.10 m/s AV Area (Vmean):   1.83 cm      PV Peak grad:  4.8 mmHg AV Area (VTI):     2.16 cm AV Vmax:           208.00 cm/s AV Vmean:          125.500 cm/s AV VTI:            0.374 m AV Peak Grad:      17.3 mmHg AV Mean Grad:      7.5 mmHg LVOT Vmax:          180.00 cm/s LVOT Vmean:        101.000 cm/s LVOT VTI:          0.355 m LVOT/AV VTI ratio: 0.95  AORTA Ao Root diam: 3.60 cm Ao Asc diam:  3.10 cm MITRAL VALVE MV Area (PHT): 3.12 cm     SHUNTS MV Area VTI:   1.81 cm     Systemic VTI:  0.36 m MV Peak grad:  6.2 mmHg     Systemic Diam: 1.70 cm MV Mean grad:  2.0 mmHg MV Vmax:       1.25 m/s MV Vmean:      66.1 cm/s MV Decel Time: 243 msec MV E velocity: 112.00 cm/s MV A velocity: 98.30 cm/s MV E/A ratio:  1.14 Epifanio Lesches MD Electronically signed by Epifanio Lesches MD Signature Date/Time: 04/15/2023/2:02:09 PM    Final       Assessment/Plan:  INTERVAL HISTORY: urine grew E faecalis and S maltophila   Principal Problem:   Bacteremia Active Problems:   Shock (HCC)   Enterococcus faecalis infection   History of prosthetic aortic valve    Luis [name] is a 67 y.o. male with  hx of AVR, aortic graft placement , bladder cancer sp TURBT chemotherapy, recent ileal conduit, with plan for further chemotherapy but now admission with septic shock to the ICU due to E faecalis bacteremia   #1 E faecalis Bacteremia, rule out Native/Prosthetic valve endocarditis, aortic graft infection  Continue AMP and ceftriaxone  He will need TEE  Urine is growing the E faecalis and could be source but again regardless of that we must investigate for endocarditis   #2 Urine culture with E faecalis and S maltophila  The former is clearly a pathogen being in his blood as well  The S maltophila is of more dubious signficance  In talking to the wife and the patient they stated that his urine culture was simply taken from the bag over his ileal conduit where urine collects  I suspect the S maltophila is simply a colonizer of the plastic rather than a pathogen here   I have personally spent 50 minutes involved in face-to-face and non-face-to-face activities for this patient on the day of the visit. Professional time spent includes the  following activities: Preparing to see the patient (review of tests), Obtaining and/or reviewing separately obtained history (admission/discharge record), Performing a medically appropriate examination and/or evaluation ,  Ordering medications/tests/procedures, referring and communicating with other health care professionals, Documenting clinical information in the EMR, Independently interpreting results (not separately reported), Communicating results to the patient/family/caregiver, Counseling and educating the patient/family/caregiver and Care coordination (not separately reported).      LOS: 2 days   Acey Lav 04/15/2023, 4:59 PM

## 2023-04-15 NOTE — Plan of Care (Signed)

## 2023-04-15 NOTE — Plan of Care (Signed)

## 2023-04-15 NOTE — Progress Notes (Signed)
Subjective: No new complaints   Antibiotics:  Anti-infectives (From admission, onward)    Start     Dose/Rate Route Frequency Ordered Stop   04/14/23 1030  cefTRIAXone (ROCEPHIN) 2 g in sodium chloride 0.9 % 100 mL IVPB        2 g 200 mL/hr over 30 Minutes Intravenous Every 12 hours 04/14/23 0938     04/14/23 0030  ampicillin (OMNIPEN) 2 g in sodium chloride 0.9 % 100 mL IVPB        2 g 300 mL/hr over 20 Minutes Intravenous Every 6 hours 04/13/23 2332     04/13/23 2300  ceFEPIme (MAXIPIME) 2 g in sodium chloride 0.9 % 100 mL IVPB  Status:  Discontinued        2 g 200 mL/hr over 30 Minutes Intravenous Every 12 hours 04/13/23 1727 04/13/23 2331   04/13/23 1100  vancomycin (VANCOREADY) IVPB 2000 mg/400 mL        2,000 mg 200 mL/hr over 120 Minutes Intravenous  Once 04/13/23 1025 04/13/23 1354   04/13/23 1015  ceFEPIme (MAXIPIME) 2 g in sodium chloride 0.9 % 100 mL IVPB        2 g 200 mL/hr over 30 Minutes Intravenous  Once 04/13/23 1014 04/13/23 1140   04/13/23 1015  metroNIDAZOLE (FLAGYL) IVPB 500 mg        500 mg 100 mL/hr over 60 Minutes Intravenous  Once 04/13/23 1014 04/13/23 1215   04/13/23 1015  vancomycin (VANCOCIN) IVPB 1000 mg/200 mL premix  Status:  Discontinued        1,000 mg 200 mL/hr over 60 Minutes Intravenous  Once 04/13/23 1014 04/13/23 1025       Medications: Scheduled Meds:  aspirin EC  81 mg Oral Daily   Chlorhexidine Gluconate Cloth  6 each Topical Daily   feeding supplement  1 Container Oral TID BM   rosuvastatin  10 mg Oral Daily   venlafaxine XR  150 mg Oral Q breakfast   Continuous Infusions:  ampicillin (OMNIPEN) IV 300 mL/hr at 04/15/23 1113   cefTRIAXone (ROCEPHIN)  IV Stopped (04/15/23 0921)   PRN Meds:.acetaminophen, docusate sodium, ondansetron (ZOFRAN) IV, [START ON 04/16/2023] pneumococcal 20-valent conjugate vaccine, polyethylene glycol    Objective: Weight change: 6.065 kg  Intake/Output Summary (Last 24 hours) at  04/15/2023 1659 Last data filed at 04/15/2023 1600 Gross per 24 hour  Intake 1141.18 ml  Output 5135 ml  Net -3993.82 ml   Blood pressure (!) 129/59, pulse (!) 56, temperature 97.8 F (36.6 C), temperature source Oral, resp. rate (!) 22, height 5\' 6"  (1.676 m), weight 122.1 kg, SpO2 100%. Temp:  [97.8 F (36.6 C)-98.7 F (37.1 C)] 97.8 F (36.6 C) (12/21 1600) Pulse Rate:  [42-66] 56 (12/21 1300) Resp:  [8-22] 22 (12/21 1300) BP: (81-168)/(34-71) 129/59 (12/21 1300) SpO2:  [85 %-100 %] 100 % (12/21 1300) Weight:  [122.1 kg] 122.1 kg (12/21 0500)  Physical Exam: Physical Exam Constitutional:      Appearance: He is well-developed.  HENT:     Head: Normocephalic and atraumatic.  Eyes:     Conjunctiva/sclera: Conjunctivae normal.  Cardiovascular:     Rate and Rhythm: Normal rate and regular rhythm.  Pulmonary:     Effort: Pulmonary effort is normal. No respiratory distress.     Breath sounds: No wheezing.  Abdominal:     General: There is no distension.     Palpations: Abdomen is soft.  Musculoskeletal:  General: Normal range of motion.     Cervical back: Normal range of motion and neck supple.  Skin:    General: Skin is warm and dry.     Findings: No erythema or rash.  Neurological:     General: No focal deficit present.     Mental Status: He is alert and oriented to person, place, and time.  Psychiatric:        Mood and Affect: Mood normal.        Behavior: Behavior normal.        Thought Content: Thought content normal.        Judgment: Judgment normal.     Ileal conduit is clean  CBC:    BMET Recent Labs    04/14/23 0250 04/15/23 0928  NA 134* 137  K 3.4* 3.4*  CL 104 106  CO2 18* 23  GLUCOSE 109* 129*  BUN 29* 21  CREATININE 2.01* 1.44*  CALCIUM 7.7* 8.0*     Liver Panel  Recent Labs    04/13/23 1024  PROT 8.9*  ALBUMIN 3.2*  AST 36  ALT 32  ALKPHOS 136*  BILITOT 1.0       Sedimentation Rate No results for input(s):  "ESRSEDRATE" in the last 72 hours. C-Reactive Protein No results for input(s): "CRP" in the last 72 hours.  Micro Results: Recent Results (from the past 720 hours)  Resp panel by RT-PCR (RSV, Flu A&B, Covid) Anterior Nasal Swab     Status: None   Collection Time: 04/13/23 10:14 AM   Specimen: Anterior Nasal Swab  Result Value Ref Range Status   SARS Coronavirus 2 by RT PCR NEGATIVE NEGATIVE Final    Comment: (NOTE) SARS-CoV-2 target nucleic acids are NOT DETECTED.  The SARS-CoV-2 RNA is generally detectable in upper respiratory specimens during the acute phase of infection. The lowest concentration of SARS-CoV-2 viral copies this assay can detect is 138 copies/mL. A negative result does not preclude SARS-Cov-2 infection and should not be used as the sole basis for treatment or other patient management decisions. A negative result may occur with  improper specimen collection/handling, submission of specimen other than nasopharyngeal swab, presence of viral mutation(s) within the areas targeted by this assay, and inadequate number of viral copies(<138 copies/mL). A negative result must be combined with clinical observations, patient history, and epidemiological information. The expected result is Negative.  Fact Sheet for Patients:  BloggerCourse.com  Fact Sheet for Healthcare Providers:  SeriousBroker.it  This test is no t yet approved or cleared by the [place] FDA and  has been authorized for detection and/or diagnosis of SARS-CoV-2 by FDA under an Emergency Use Authorization (EUA). This EUA will remain  in effect (meaning this test can be used) for the duration of the COVID-19 declaration under Section 564(b)(1) of the Act, 21 U.S.C.section 360bbb-3(b)(1), unless the authorization is terminated  or revoked sooner.       Influenza A by PCR NEGATIVE NEGATIVE Final   Influenza B by PCR NEGATIVE NEGATIVE Final     Comment: (NOTE) The Xpert Xpress SARS-CoV-2/FLU/RSV plus assay is intended as an aid in the diagnosis of influenza from Nasopharyngeal swab specimens and should not be used as a sole basis for treatment. Nasal washings and aspirates are unacceptable for Xpert Xpress SARS-CoV-2/FLU/RSV testing.  Fact Sheet for Patients: BloggerCourse.com  Fact Sheet for Healthcare Providers: SeriousBroker.it  This test is not yet approved or cleared by the [place] FDA and has been authorized for detection and/or diagnosis  of SARS-CoV-2 by FDA under an Emergency Use Authorization (EUA). This EUA will remain in effect (meaning this test can be used) for the duration of the COVID-19 declaration under Section 564(b)(1) of the Act, 21 U.S.C. section 360bbb-3(b)(1), unless the authorization is terminated or revoked.     Resp Syncytial Virus by PCR NEGATIVE NEGATIVE Final    Comment: (NOTE) Fact Sheet for Patients: BloggerCourse.com  Fact Sheet for Healthcare Providers: SeriousBroker.it  This test is not yet approved or cleared by the [place] FDA and has been authorized for detection and/or diagnosis of SARS-CoV-2 by FDA under an Emergency Use Authorization (EUA). This EUA will remain in effect (meaning this test can be used) for the duration of the COVID-19 declaration under Section 564(b)(1) of the Act, 21 U.S.C. section 360bbb-3(b)(1), unless the authorization is terminated or revoked.  Performed at [facility], [address] [place], [place] [address]   Urine Culture     Status: Abnormal   Collection Time: 04/13/23 10:14 AM   Specimen: Urine, Random  Result Value Ref Range Status   Specimen Description   Final    URINE, RANDOM Performed at [facility], [address] [place], [place] [address]    Special Requests   Final    NONE Reflexed  from [id number] Performed at [facility], [address] [place], [place] [address]    Culture (A)  Final    >=100,000 COLONIES/mL ENTEROCOCCUS FAECALIS >=100,000 COLONIES/mL STENOTROPHOMONAS MALTOPHILIA    Report Status 04/15/2023 FINAL  Final   Organism ID, Bacteria ENTEROCOCCUS FAECALIS (A)  Final   Organism ID, Bacteria STENOTROPHOMONAS MALTOPHILIA (A)  Final      Susceptibility   Enterococcus faecalis - MIC*    AMPICILLIN <=2 SENSITIVE Sensitive     NITROFURANTOIN <=16 SENSITIVE Sensitive     VANCOMYCIN 1 SENSITIVE Sensitive     * >=100,000 COLONIES/mL ENTEROCOCCUS FAECALIS   Stenotrophomonas maltophilia - MIC*    LEVOFLOXACIN 0.25 SENSITIVE Sensitive     TRIMETH/SULFA <=20 SENSITIVE Sensitive     * >=100,000 COLONIES/mL STENOTROPHOMONAS MALTOPHILIA  Blood Culture (routine x 2)     Status: Abnormal (Preliminary result)   Collection Time: 04/13/23 10:24 AM   Specimen: BLOOD  Result Value Ref Range Status   Specimen Description   Final    BLOOD RIGHT ANTECUBITAL Performed at [facility], [address] [place], [place] [address]    Special Requests   Final    BOTTLES DRAWN AEROBIC AND ANAEROBIC Blood Culture adequate volume Performed at [facility], [address] [place], [place] [address]    Culture  Setup Time   Final    GRAM POSITIVE COCCI IN CHAINS IN BOTH AEROBIC AND ANAEROBIC BOTTLES CRITICAL RESULT CALLED TO, READ BACK BY AND VERIFIED WITH: PHARMD L. Bernarda Caffey [id number] @ 2313 FH    Culture (A)  Final    ENTEROCOCCUS FAECALIS SUSCEPTIBILITIES TO FOLLOW Performed at [facility], [address] [place], [place] [address]    Report Status PENDING  Incomplete  Blood Culture ID Panel (Reflexed)     Status: Abnormal   Collection Time: 04/13/23 10:24 AM  Result Value Ref Range Status   Enterococcus faecalis DETECTED (A) NOT DETECTED Final    Comment: CRITICAL RESULT CALLED TO, READ BACK BY AND  VERIFIED WITH: PHARMD L. POINTDEXTER [id number] @ 2313 FH    Enterococcus Faecium NOT DETECTED NOT DETECTED Final   Listeria monocytogenes NOT DETECTED NOT  DETECTED Final   Staphylococcus species NOT DETECTED NOT DETECTED Final   Staphylococcus aureus (BCID) NOT DETECTED NOT DETECTED Final   Staphylococcus epidermidis NOT DETECTED NOT DETECTED Final   Staphylococcus lugdunensis NOT DETECTED NOT DETECTED Final   Streptococcus species NOT DETECTED NOT DETECTED Final   Streptococcus agalactiae NOT DETECTED NOT DETECTED Final   Streptococcus pneumoniae NOT DETECTED NOT DETECTED Final   Streptococcus pyogenes NOT DETECTED NOT DETECTED Final   A.calcoaceticus-baumannii NOT DETECTED NOT DETECTED Final   Bacteroides fragilis NOT DETECTED NOT DETECTED Final   Enterobacterales NOT DETECTED NOT DETECTED Final   Enterobacter cloacae complex NOT DETECTED NOT DETECTED Final   Escherichia coli NOT DETECTED NOT DETECTED Final   Klebsiella aerogenes NOT DETECTED NOT DETECTED Final   Klebsiella oxytoca NOT DETECTED NOT DETECTED Final   Klebsiella pneumoniae NOT DETECTED NOT DETECTED Final   Proteus species NOT DETECTED NOT DETECTED Final   Salmonella species NOT DETECTED NOT DETECTED Final   Serratia marcescens NOT DETECTED NOT DETECTED Final   Haemophilus influenzae NOT DETECTED NOT DETECTED Final   Neisseria meningitidis NOT DETECTED NOT DETECTED Final   Pseudomonas aeruginosa NOT DETECTED NOT DETECTED Final   Stenotrophomonas maltophilia NOT DETECTED NOT DETECTED Final   Candida albicans NOT DETECTED NOT DETECTED Final   Candida auris NOT DETECTED NOT DETECTED Final   Candida glabrata NOT DETECTED NOT DETECTED Final   Candida krusei NOT DETECTED NOT DETECTED Final   Candida parapsilosis NOT DETECTED NOT DETECTED Final   Candida tropicalis NOT DETECTED NOT DETECTED Final   Cryptococcus neoformans/gattii NOT DETECTED NOT DETECTED Final   Vancomycin resistance NOT DETECTED NOT DETECTED Final     Comment: Performed at [facility], [address] [place], [place] [address]  Blood Culture (routine x 2)     Status: Abnormal (Preliminary result)   Collection Time: 04/13/23 10:27 AM   Specimen: BLOOD LEFT HAND  Result Value Ref Range Status   Specimen Description   Final    BLOOD LEFT HAND Performed at [facility], [address] [place], [place] [address]    Special Requests   Final    BOTTLES DRAWN AEROBIC AND ANAEROBIC Blood Culture adequate volume Performed at [facility], [address] [place], [place] [address]    Culture  Setup Time   Final    GRAM POSITIVE COCCI IN CHAINS IN BOTH AEROBIC AND ANAEROBIC BOTTLES Performed at [facility], [address] [place], [place] [address]    Culture ENTEROCOCCUS FAECALIS (A)  Final   Report Status PENDING  Incomplete  MRSA Next Gen by PCR, Nasal     Status: None   Collection Time: 04/13/23  5:27 PM   Specimen: Nasal Mucosa; Nasal Swab  Result Value Ref Range Status   MRSA by PCR Next Gen NOT DETECTED NOT DETECTED Final    Comment: (NOTE) The GeneXpert MRSA Assay (FDA approved for NASAL specimens only), is one component of a comprehensive MRSA colonization surveillance program. It is not intended to diagnose MRSA infection nor to guide or monitor treatment for MRSA infections. Test performance is not FDA approved in patients less than 21 years old. Performed at [facility], [address], [place], [place] [address]   Culture, blood (Routine X 2) w Reflex to ID Panel     Status: None (Preliminary result)   Collection Time: 04/15/23  8:16 AM   Specimen: BLOOD RIGHT ARM  Result Value Ref Range Status  Specimen Description   Final    BLOOD RIGHT ARM Performed at [facility], [address], [place], [place] [address]    Special Requests   Final    BOTTLES DRAWN AEROBIC AND ANAEROBIC Blood Culture results may not be optimal due to an inadequate volume of  blood received in culture bottles Performed at [facility], [address], [place], [place] [address]    Culture PENDING  Incomplete   Report Status PENDING  Incomplete  Culture, blood (Routine X 2) w Reflex to ID Panel     Status: None (Preliminary result)   Collection Time: 04/15/23  8:29 AM   Specimen: BLOOD RIGHT HAND  Result Value Ref Range Status   Specimen Description   Final    BLOOD RIGHT HAND Performed at [facility], [address], [place], [place] [address]    Special Requests   Final    BOTTLES DRAWN AEROBIC AND ANAEROBIC Blood Culture results may not be optimal due to an inadequate volume of blood received in culture bottles Performed at [facility], [address] [place], [place] [address]    Culture PENDING  Incomplete   Report Status PENDING  Incomplete    Studies/Results: ECHOCARDIOGRAM COMPLETE Result Date: 04/15/2023    ECHOCARDIOGRAM REPORT   Patient Name:   Luis [name] Date of Exam: 04/15/2023 Medical Rec #:  [id number]        Height:       66.0 in Accession #:    [id number]       Weight:       269.2 lb Date of Birth:  1956-01-26        BSA:          2.269 m Patient Age:    67 years         BP:           103/54 mmHg Patient Gender: M                HR:           57 bpm. Exam Location:  Inpatient Procedure: 2D Echo, Cardiac Doppler, Color Doppler and Intracardiac            Opacification Agent Indications:    Bacteremia  History:        Patient has prior history of Echocardiogram examinations, most                 recent 11/24/2022. CHF, CAD and Previous Myocardial Infarction,                 Arrythmias:Atrial Fibrillation, Signs/Symptoms:Dyspnea; Risk                 Factors:Hypertension, Dyslipidemia and Sleep Apnea.                 Aortic Valve: 25 mm Edwards Inspiris Resilia valve is present in                 the aortic position.  Sonographer:    Vern Claude Referring Phys: [id number] Blair Mesina N [place] IMPRESSIONS  1.  Left ventricular ejection fraction, by estimation, is 65 to 70%. The left ventricle has normal function. The left ventricle has no regional wall motion abnormalities. There is mild left ventricular hypertrophy. Left ventricular diastolic parameters are indeterminate.  2. Right ventricular systolic function is normal. The right ventricular size is normal. Tricuspid regurgitation signal is inadequate for assessing PA  pressure.  3. The mitral valve is normal in structure. Trivial mitral valve regurgitation. No evidence of mitral stenosis.  4. The aortic valve has been repaired/replaced. Aortic valve regurgitation is not visualized. There is a 25 mm Edwards Inspiris Resilia valve present in the aortic position. Vmax 2.2 m/s, MG , EOA 1.9 cm^2 Conclusion(s)/Recommendation(s): No evidence of valvular vegetations on this transthoracic echocardiogram, though poor quality study Consider a transesophageal echocardiogram to exclude infective endocarditis if clinically indicated. FINDINGS  Left Ventricle: Left ventricular ejection fraction, by estimation, is 65 to 70%. The left ventricle has normal function. The left ventricle has no regional wall motion abnormalities. The left ventricular internal cavity size was normal in size. There is  mild left ventricular hypertrophy. Left ventricular diastolic parameters are indeterminate. Right Ventricle: The right ventricular size is normal. No increase in right ventricular wall thickness. Right ventricular systolic function is normal. Tricuspid regurgitation signal is inadequate for assessing PA pressure. Left Atrium: Left atrial size was normal in size. Right Atrium: Right atrial size was normal in size. Pericardium: Trivial pericardial effusion is present. Mitral Valve: The mitral valve is normal in structure. Trivial mitral valve regurgitation. No evidence of mitral valve stenosis. MV peak gradient, 6.2 mmHg. The mean mitral valve gradient is 2.0 mmHg. Tricuspid Valve: The  tricuspid valve is normal in structure. Tricuspid valve regurgitation is trivial. Aortic Valve: The aortic valve has been repaired/replaced. Aortic valve regurgitation is not visualized. Aortic valve mean gradient measures 7.5 mmHg. Aortic valve peak gradient measures 17.3 mmHg. Aortic valve area, by VTI measures 2.16 cm. There is a 25 mm Edwards Inspiris Resilia valve present in the aortic position. Pulmonic Valve: The pulmonic valve was not well visualized. Pulmonic valve regurgitation is trivial. Aorta: The aortic root and ascending aorta are structurally normal, with no evidence of dilitation. IAS/Shunts: The interatrial septum was not well visualized.  LEFT VENTRICLE PLAX 2D LVIDd:         4.90 cm      Diastology LVIDs:         3.40 cm      LV e' medial:    6.42 cm/s LV PW:         1.20 cm      LV E/e' medial:  17.4 LV IVS:        1.00 cm      LV e' lateral:   17.30 cm/s LVOT diam:     1.70 cm      LV E/e' lateral: 6.5 LV SV:         81 LV SV Index:   36 LVOT Area:     2.27 cm  LV Volumes (MOD) LV vol d, MOD A2C: 143.0 ml LV vol d, MOD A4C: 214.0 ml LV vol s, MOD A2C: 31.1 ml LV vol s, MOD A4C: 48.3 ml LV SV MOD A2C:     111.9 ml LV SV MOD A4C:     214.0 ml LV SV MOD BP:      135.1 ml RIGHT VENTRICLE RV Basal diam:  3.70 cm RV Mid diam:    2.90 cm RV S prime:     14.50 cm/s TAPSE (M-mode): 2.9 cm LEFT ATRIUM             Index        RIGHT ATRIUM           Index LA diam:        3.90 cm 1.72 cm/m   RA Area:  20.50 cm LA Vol (A2C):   52.2 ml 23.01 ml/m  RA Volume:   55.20 ml  24.33 ml/m LA Vol (A4C):   50.1 ml 22.08 ml/m LA Biplane Vol: 51.2 ml 22.57 ml/m  AORTIC VALVE                     PULMONIC VALVE AV Area (Vmax):    1.96 cm      PV Vmax:       1.10 m/s AV Area (Vmean):   1.83 cm      PV Peak grad:  4.8 mmHg AV Area (VTI):     2.16 cm AV Vmax:           208.00 cm/s AV Vmean:          125.500 cm/s AV VTI:            0.374 m AV Peak Grad:      17.3 mmHg AV Mean Grad:      7.5 mmHg LVOT Vmax:          180.00 cm/s LVOT Vmean:        101.000 cm/s LVOT VTI:          0.355 m LVOT/AV VTI ratio: 0.95  AORTA Ao Root diam: 3.60 cm Ao Asc diam:  3.10 cm MITRAL VALVE MV Area (PHT): 3.12 cm     SHUNTS MV Area VTI:   1.81 cm     Systemic VTI:  0.36 m MV Peak grad:  6.2 mmHg     Systemic Diam: 1.70 cm MV Mean grad:  2.0 mmHg MV Vmax:       1.25 m/s MV Vmean:      66.1 cm/s MV Decel Time: 243 msec MV E velocity: 112.00 cm/s MV A velocity: 98.30 cm/s MV E/A ratio:  1.14 Epifanio Lesches MD Electronically signed by Epifanio Lesches MD Signature Date/Time: 04/15/2023/2:02:09 PM    Final       Assessment/Plan:  INTERVAL HISTORY: urine grew E faecalis and S maltophila   Principal Problem:   Bacteremia Active Problems:   Shock (HCC)   Enterococcus faecalis infection   History of prosthetic aortic valve    Luis [name] is a 67 y.o. male with  hx of AVR, aortic graft placement , bladder cancer sp TURBT chemotherapy, recent ileal conduit, with plan for further chemotherapy but now admission with septic shock to the ICU due to E faecalis bacteremia   #1 E faecalis Bacteremia, rule out Native/Prosthetic valve endocarditis, aortic graft infection  Continue AMP and ceftriaxone  He will need TEE  Urine is growing the E faecalis and could be source but again regardless of that we must investigate for endocarditis   #2 Urine culture with E faecalis and S maltophila  The former is clearly a pathogen being in his blood as well  The S maltophila is of more dubious signficance  In talking to the wife and the patient they stated that his urine culture was simply taken from the bag over his ileal conduit where urine collects  I suspect the S maltophila is simply a colonizer of the plastic rather than a pathogen here   I have personally spent 50 minutes involved in face-to-face and non-face-to-face activities for this patient on the day of the visit. Professional time spent includes the  following activities: Preparing to see the patient (review of tests), Obtaining and/or reviewing separately obtained history (admission/discharge record), Performing a medically appropriate examination and/or evaluation ,  Ordering medications/tests/procedures, referring and communicating with other health care professionals, Documenting clinical information in the EMR, Independently interpreting results (not separately reported), Communicating results to the patient/family/caregiver, Counseling and educating the patient/family/caregiver and Care coordination (not separately reported).      LOS: 2 days   Acey Lav 04/15/2023, 4:59 PM

## 2023-04-15 NOTE — Progress Notes (Signed)
Subjective: NAEO.  Feeling better today and has no complaints.   Objective: Vital signs in last 24 hours: Temp:  [97.9 F (36.6 C)-99.4 F (37.4 C)] 98.1 F (36.7 C) (12/21 0800) Pulse Rate:  [40-72] 55 (12/21 0800) Resp:  [8-24] 22 (12/21 0800) BP: (81-168)/(29-91) 102/49 (12/21 0800) SpO2:  [85 %-100 %] 96 % (12/21 0800) Weight:  [122.1 kg] 122.1 kg (12/21 0500)  Intake/Output from previous day: 12/20 0701 - 12/21 0700 In: 2174.4 [I.V.:990.5; Blood:290; IV Piggyback:893.9] Out: 5135 [Urine:4625; Stool:510]  Intake/Output this shift: Total I/O In: -  Out: 500 [Urine:500]  Physical Exam:  General: Alert and oriented CV: RRR, palpable distal pulses Lungs: CTAB, equal chest rise Abdomen: Soft, NTND, no rebound or guarding Incisions: well healed Gu: Urostomy is viable and draining yellow urine  Ext: NT, No erythema  Lab Results: Recent Labs    04/14/23 0250 04/14/23 0911 04/14/23 1901  HGB 7.2* 7.3* 8.4*  HCT 23.7* 24.8* 27.5*   BMET Recent Labs    04/13/23 1024 04/14/23 0250  NA 132* 134*  K 4.2 3.4*  CL 99 104  CO2 19* 18*  GLUCOSE 150* 109*  BUN 33* 29*  CREATININE 2.42* 2.01*  CALCIUM 8.8* 7.7*     Studies/Results: CT ABDOMEN PELVIS WO CONTRAST Result Date: 04/13/2023 CLINICAL DATA:  Status post cystectomy on 03/03/2023 with pain and fever, initial encounter EXAM: CT ABDOMEN AND PELVIS WITHOUT CONTRAST TECHNIQUE: Multidetector CT imaging of the abdomen and pelvis was performed following the standard protocol without IV contrast. RADIATION DOSE REDUCTION: This exam was performed according to the departmental dose-optimization program which includes automated exposure control, adjustment of the mA and/or kV according to patient size and/or use of iterative reconstruction technique. COMPARISON:  12/21/2022 FINDINGS: Lower chest: No acute abnormality. Hepatobiliary: Fatty infiltration of the liver is noted. Gallbladder has been surgically removed.  Pancreas: Unremarkable. No pancreatic ductal dilatation or surrounding inflammatory changes. Spleen: Normal in size without focal abnormality. Adrenals/Urinary Tract: Adrenal glands are within normal limits. Kidneys demonstrate no renal calculi. Small exophytic cysts are noted in left kidney stable from the prior exam. No further follow-up is recommended. Mild fullness of the collecting systems is noted bilaterally. A ureteral stent is noted on the right. Interval cystectomy is noted with urostomy in the right lower quadrant. The ureteral stent on the right extends into the ostomy bag. Stomach/Bowel: Stomach is within normal limits. Small bowel shows no obstructive change. Scattered fluid and fecal material is noted throughout the colon. No obstructive changes are seen. Postsurgical changes in the right lower quadrant are noted consistent with creation of an ileal conduit Vascular/Lymphatic: Aortic atherosclerosis. No enlarged abdominal or pelvic lymph nodes. Reproductive: Prostate has been surgically removed during the cystectomy procedure. No fluid is noted in the operative bed. Other: No free fluid is noted.  No focal herniation is noted. Musculoskeletal: No acute or significant osseous findings. IMPRESSION: Changes consistent with history of prior cystectomy and prostatectomy with ileal conduit formation in the right mid abdomen. Mild fullness of the collecting systems is noted bilaterally. A right-sided nephroureteral stent is noted extending into the ostomy bag. No evidence of postoperative abscess or extravasation is identified. No acute abnormality is seen. Electronically Signed   By: Alcide Clever M.D.   On: 04/13/2023 15:32   DG Chest Port 1 View Result Date: 04/13/2023 CLINICAL DATA:  Possible sepsis EXAM: PORTABLE CHEST 1 VIEW COMPARISON:  Chest x-ray October 20, 2021 FINDINGS: The cardiomediastinal silhouette is unchanged in contour status post  median sternotomy. No focal pulmonary opacity. No pleural  effusion or pneumothorax. The visualized upper abdomen is unremarkable. No acute osseous abnormality. IMPRESSION: No active disease. Electronically Signed   By: Jacob Moores M.D.   On: 04/13/2023 14:09    Assessment/Plan: 67 year old male with Enterra coccal bacteremia, s/p recent robotic radical cystectomy with ileal conduit urinary diversion  -Blood and urine culture sensitivities are pending. Continue broad-spectrum antibiotic coverage with ampicillin and ceftriaxone.  Continue supportive care.  Call back if needed   LOS: 2 days   Rhoderick Moody, MD [facility] Pager: [phone]  04/15/2023, 8:53 AM

## 2023-04-15 NOTE — Progress Notes (Signed)
[facility]  PROGRESS NOTE  SAN MANSBERGER IHK:[id number] DOB: 05/26/1955 DOA: 04/13/2023 PCP: Nelwyn Salisbury, MD   Brief HPI:    27 yoM with PMH as below significant for bladder cancer (dx 2020 s/p TURBT and completion of 8 cycles of pembrolizumab (03/03/23) who was sent from urology office today with concern for sepsis. Followed by Dr. [name] with oncology, scheduled to Tentatively scheduled to start gemcitabine and cisplatin after Christmas given positive margins and spread to lymph nodes next week. Pt presenting from home with 2-3 weeks of progressive generalized weakness, decreased PO intake, and 28lb weight loss since under going total cystectomy with ileal conduit and prostatectomy Nov 8 w/ Dr. [name]  CT a/p showed no evidence of postoperative abscess or extravasation or acute abnormality; existing right sided nephroureteral stent well positioned. Urology consulting. Pan-cultured and started on empiric vanc, cefepime, and flagyl    Assessment/Plan:    Septic shock -Enterococcal bacteremia, likely from site of ileal conduit -ID consulted, started on ceftriaxone and ampicillin -Follow blood culture results -Levophed has been weaned off -Plan for TEE on Monday -2D echo done today; follow results    Acute kidney injury -Likely prerenal in setting of poor p.o. intake, diuretic use -Creatinine is improving, today creatinine is 2.01 -Avoid nephrotoxins, hypotension   Anemia -Likely dilutional -Hemoglobin has improved to 8.4  Hypertension -Blood pressure is soft -He is on Coreg and Lasix at home which is currently on hold due to hypotension  Protein calorie malnutrition -28 pound weight loss since 11/8 -Consult dietitian     OSA on CPAP - CPAP nightly      Metastatic Bladder cancer s/p total cystectomy with ileal conduit and prostatectomy Nov 8 w/ Dr. [name] w/urology - Urology following, appreciate input - followed by Dr. [name], f/u oncology outpt;   tentative  plans to start gemcitabine and cisplatin      Depression/ anxiety -Self hold due to acute kidney injury   Hyperlipidemia -Continue rosuvastatin  Medications     aspirin EC  81 mg Oral Daily   Chlorhexidine Gluconate Cloth  6 each Topical Daily   feeding supplement  1 Container Oral TID BM   midodrine  5 mg Oral TID   rosuvastatin  10 mg Oral Daily   venlafaxine XR  150 mg Oral Q breakfast     Data Reviewed:   CBG:  Recent Labs  Lab 04/14/23 1612 04/14/23 1925 04/15/23 0010 04/15/23 0346 04/15/23 0730  GLUCAP 151* 134* 98 100* 113*    SpO2: 98 % O2 Flow Rate (L/min): 2 L/min    Vitals:   04/15/23 0615 04/15/23 0630 04/15/23 0700 04/15/23 0715  BP: (!) 101/36 117/65 130/68 (!) 150/55  Pulse: (!) 48 (!) 47 (!) 48   Resp: 18 15 16    Temp:      TempSrc:      SpO2: 97% 98% 98%   Weight:      Height:          Data Reviewed:  Basic Metabolic Panel: Recent Labs  Lab 04/13/23 1024 04/14/23 0250  NA 132* 134*  K 4.2 3.4*  CL 99 104  CO2 19* 18*  GLUCOSE 150* 109*  BUN 33* 29*  CREATININE 2.42* 2.01*  CALCIUM 8.8* 7.7*    CBC: Recent Labs  Lab 04/13/23 1024 04/14/23 0250 04/14/23 0911 04/14/23 1901  WBC 19.6* 8.6  --   --   NEUTROABS 17.7*  --   --   --   HGB 9.8*  7.2* 7.3* 8.4*  HCT 31.8* 23.7* 24.8* 27.5*  MCV 85.9 89.1  --   --   PLT 260 163  --   --     LFT Recent Labs  Lab 04/13/23 1024  AST 36  ALT 32  ALKPHOS 136*  BILITOT 1.0  PROT 8.9*  ALBUMIN 3.2*     Antibiotics: Anti-infectives (From admission, onward)    Start     Dose/Rate Route Frequency Ordered Stop   04/14/23 1030  cefTRIAXone (ROCEPHIN) 2 g in sodium chloride 0.9 % 100 mL IVPB        2 g 200 mL/hr over 30 Minutes Intravenous Every 12 hours 04/14/23 0938     04/14/23 0030  ampicillin (OMNIPEN) 2 g in sodium chloride 0.9 % 100 mL IVPB        2 g 300 mL/hr over 20 Minutes Intravenous Every 6 hours 04/13/23 2332     04/13/23 2300  ceFEPIme (MAXIPIME) 2 g in  sodium chloride 0.9 % 100 mL IVPB  Status:  Discontinued        2 g 200 mL/hr over 30 Minutes Intravenous Every 12 hours 04/13/23 1727 04/13/23 2331   04/13/23 1100  vancomycin (VANCOREADY) IVPB 2000 mg/400 mL        2,000 mg 200 mL/hr over 120 Minutes Intravenous  Once 04/13/23 1025 04/13/23 1354   04/13/23 1015  ceFEPIme (MAXIPIME) 2 g in sodium chloride 0.9 % 100 mL IVPB        2 g 200 mL/hr over 30 Minutes Intravenous  Once 04/13/23 1014 04/13/23 1140   04/13/23 1015  metroNIDAZOLE (FLAGYL) IVPB 500 mg        500 mg 100 mL/hr over 60 Minutes Intravenous  Once 04/13/23 1014 04/13/23 1215   04/13/23 1015  vancomycin (VANCOCIN) IVPB 1000 mg/200 mL premix  Status:  Discontinued        1,000 mg 200 mL/hr over 60 Minutes Intravenous  Once 04/13/23 1014 04/13/23 1025        DVT prophylaxis: SCDs  Code Status: Full code  Family Communication:    CONSULTS    Subjective   Denies any complaints   Objective    Physical Examination:   General-appears in no acute distress Heart-S1-S2, regular, no murmur auscultated Lungs-clear to auscultation bilaterally, no wheezing or crackles auscultated Abdomen-soft, nontender, no organomegaly, ilial conduit in place, ileostomy bag Extremities-no edema in the lower extremities Neuro-alert, oriented x3, no focal deficit noted  Status is: Inpatient:             Julieanna Geraci S Cledis Sohn   [facility] If 7PM-7AM, please contact night-coverage at [email or web address], Office  [phone]   04/15/2023, 7:51 AM  LOS: 2 days

## 2023-04-16 DIAGNOSIS — N179 Acute kidney failure, unspecified: Secondary | ICD-10-CM | POA: Diagnosis not present

## 2023-04-16 DIAGNOSIS — A419 Sepsis, unspecified organism: Secondary | ICD-10-CM | POA: Diagnosis not present

## 2023-04-16 DIAGNOSIS — R6521 Severe sepsis with septic shock: Secondary | ICD-10-CM | POA: Diagnosis not present

## 2023-04-16 LAB — GLUCOSE, CAPILLARY
Glucose-Capillary: 105 mg/dL — ABNORMAL HIGH (ref 70–99)
Glucose-Capillary: 111 mg/dL — ABNORMAL HIGH (ref 70–99)
Glucose-Capillary: 84 mg/dL (ref 70–99)
Glucose-Capillary: 85 mg/dL (ref 70–99)
Glucose-Capillary: 88 mg/dL (ref 70–99)
Glucose-Capillary: 91 mg/dL (ref 70–99)

## 2023-04-16 LAB — CBC
HCT: 27.2 % — ABNORMAL LOW (ref 39.0–52.0)
Hemoglobin: 8.4 g/dL — ABNORMAL LOW (ref 13.0–17.0)
MCH: 27.1 pg (ref 26.0–34.0)
MCHC: 30.9 g/dL (ref 30.0–36.0)
MCV: 87.7 fL (ref 80.0–100.0)
Platelets: 156 10*3/uL (ref 150–400)
RBC: 3.1 MIL/uL — ABNORMAL LOW (ref 4.22–5.81)
RDW: 15.3 % (ref 11.5–15.5)
WBC: 5.2 10*3/uL (ref 4.0–10.5)
nRBC: 0 % (ref 0.0–0.2)

## 2023-04-16 LAB — BASIC METABOLIC PANEL
Anion gap: 9 (ref 5–15)
BUN: 14 mg/dL (ref 8–23)
CO2: 23 mmol/L (ref 22–32)
Calcium: 8.1 mg/dL — ABNORMAL LOW (ref 8.9–10.3)
Chloride: 105 mmol/L (ref 98–111)
Creatinine, Ser: 1.28 mg/dL — ABNORMAL HIGH (ref 0.61–1.24)
GFR, Estimated: >60 mL/min (ref >60)
Glucose, Bld: 126 mg/dL — ABNORMAL HIGH (ref 70–99)
Potassium: 3.2 mmol/L — ABNORMAL LOW (ref 3.5–5.1)
Sodium: 137 mmol/L (ref 135–145)

## 2023-04-16 LAB — TYPE AND SCREEN
ABO/RH(D): A NEG
Antibody Screen: NEGATIVE
Unit division: 0

## 2023-04-16 LAB — CULTURE, BLOOD (ROUTINE X 2)
Special Requests: ADEQUATE
Special Requests: ADEQUATE

## 2023-04-16 LAB — BPAM RBC
Blood Product Expiration Date: 202501122359
ISSUE DATE / TIME: 202412201422
Unit Type and Rh: 600

## 2023-04-16 MED ORDER — SODIUM CHLORIDE 0.9 % IV SOLN
2.0000 g | INTRAVENOUS | Status: DC
  Administered 2023-04-16 – 2023-04-18 (×12): 2 g via INTRAVENOUS
  Filled 2023-04-16 (×15): qty 2000

## 2023-04-16 MED ORDER — POTASSIUM CHLORIDE 20 MEQ PO PACK
40.0000 meq | PACK | Freq: Once | ORAL | Status: AC
  Administered 2023-04-16: 40 meq via ORAL
  Filled 2023-04-16: qty 2

## 2023-04-16 NOTE — Progress Notes (Signed)
[facility]  PROGRESS NOTE  Luis [name] EXB:[id number] DOB: [birth year] DOA: 04/13/2023 PCP: Luis Salisbury, MD   Brief HPI:    13 yoM with PMH as below significant for bladder cancer (dx 2020 s/p TURBT and completion of 8 cycles of pembrolizumab (03/03/23) who was sent from urology office today with concern for sepsis. Followed by Luis [name] with oncology, scheduled to Tentatively scheduled to start gemcitabine and cisplatin after Christmas given positive margins and spread to lymph nodes next week. Pt presenting from home with 2-3 weeks of progressive generalized weakness, decreased PO intake, and 28lb weight loss since under going total cystectomy with ileal conduit and prostatectomy Nov 8 w/ Luis [name]  CT a/p showed no evidence of postoperative abscess or extravasation or acute abnormality; existing right sided nephroureteral stent well positioned. Urology consulting. Pan-cultured and started on empiric vanc, cefepime, and flagyl    Assessment/Plan:    Septic shock -Sepsis physiology has resolved, no longer requiring pressors -Levophed weaned off -Enterococcal bacteremia, likely from site of ileal conduit -ID consulted, started on ceftriaxone and ampicillin -Follow blood culture results -Plan for TEE on Monday     Acute kidney injury -Likely prerenal in setting of poor p.o. intake, diuretic use -Creatinine is improving, today creatinine is 1.28 -Avoid nephrotoxins, hypotension  Hypokalemia -Potassium is 3.2 -Replace potassium and follow BMP in am   Anemia -Likely dilutional -Hemoglobin has improved to 8.4  Hypertension -Blood pressure is soft -He is on Coreg and Lasix at home which is currently on hold due to hypotension  Protein calorie malnutrition -28 pound weight loss since 11/8 -Consult dietitian  OSA on CPAP - CPAP nightly      Metastatic Bladder cancer s/p total cystectomy with ileal conduit and prostatectomy Nov 8 w/ Luis [name] w/urology - Urology  following, appreciate input - followed by Luis [name], f/u oncology outpt;   tentative plans to start gemcitabine and cisplatin      Depression/ anxiety -Self hold due to acute kidney injury   Hyperlipidemia -Continue rosuvastatin  Medications     aspirin EC  81 mg Oral Daily   Chlorhexidine Gluconate Cloth  6 each Topical Daily   feeding supplement  1 Container Oral TID BM   rosuvastatin  10 mg Oral Daily   venlafaxine XR  150 mg Oral Q breakfast     Data Reviewed:   CBG:  Recent Labs  Lab 04/15/23 2001 04/15/23 2348 04/16/23 0337 04/16/23 0733 04/16/23 1220  GLUCAP 108* 82 105* 85 88    SpO2: 97 % O2 Flow Rate (L/min): 2 L/min    Vitals:   04/16/23 0700 04/16/23 0800 04/16/23 0900 04/16/23 1200  BP: 113/64 (!) 135/53    Pulse: (!) 53 61 63   Resp: 16 18 (!) 23   Temp:  97.9 F (36.6 C)  97.6 F (36.4 C)  TempSrc:  Oral  Oral  SpO2: 97% 99% 97%   Weight:      Height:          Data Reviewed:  Basic Metabolic Panel: Recent Labs  Lab 04/13/23 1024 04/14/23 0250 04/15/23 0928 04/16/23 0915  NA 132* 134* 137 137  K 4.2 3.4* 3.4* 3.2*  CL 99 104 106 105  CO2 19* 18* 23 23  GLUCOSE 150* 109* 129* 126*  BUN 33* 29* 21 14  CREATININE 2.42* 2.01* 1.44* 1.28*  CALCIUM 8.8* 7.7* 8.0* 8.1*  MG  --   --  1.8  --   PHOS  --   --  2.5  --     CBC: Recent Labs  Lab 04/13/23 1024 04/14/23 0250 04/14/23 0911 04/14/23 1901 04/15/23 0928 04/16/23 0915  WBC 19.6* 8.6  --   --  5.3 5.2  NEUTROABS 17.7*  --   --   --  3.3  --   HGB 9.8* 7.2* 7.3* 8.4* 7.9* 8.4*  HCT 31.8* 23.7* 24.8* 27.5* 25.8* 27.2*  MCV 85.9 89.1  --   --  88.4 87.7  PLT 260 163  --   --  146* 156    LFT Recent Labs  Lab 04/13/23 1024  AST 36  ALT 32  ALKPHOS 136*  BILITOT 1.0  PROT 8.9*  ALBUMIN 3.2*     Antibiotics: Anti-infectives (From admission, onward)    Start     Dose/Rate Route Frequency Ordered Stop   04/16/23 1200  ampicillin (OMNIPEN) 2 g in sodium  chloride 0.9 % 100 mL IVPB        2 g 300 mL/hr over 20 Minutes Intravenous Every 4 hours 04/16/23 1121     04/14/23 1030  cefTRIAXone (ROCEPHIN) 2 g in sodium chloride 0.9 % 100 mL IVPB        2 g 200 mL/hr over 30 Minutes Intravenous Every 12 hours 04/14/23 0938     04/14/23 0030  ampicillin (OMNIPEN) 2 g in sodium chloride 0.9 % 100 mL IVPB  Status:  Discontinued        2 g 300 mL/hr over 20 Minutes Intravenous Every 6 hours 04/13/23 2332 04/16/23 1121   04/13/23 2300  ceFEPIme (MAXIPIME) 2 g in sodium chloride 0.9 % 100 mL IVPB  Status:  Discontinued        2 g 200 mL/hr over 30 Minutes Intravenous Every 12 hours 04/13/23 1727 04/13/23 2331   04/13/23 1100  vancomycin (VANCOREADY) IVPB 2000 mg/400 mL        2,000 mg 200 mL/hr over 120 Minutes Intravenous  Once 04/13/23 1025 04/13/23 1354   04/13/23 1015  ceFEPIme (MAXIPIME) 2 g in sodium chloride 0.9 % 100 mL IVPB        2 g 200 mL/hr over 30 Minutes Intravenous  Once 04/13/23 1014 04/13/23 1140   04/13/23 1015  metroNIDAZOLE (FLAGYL) IVPB 500 mg        500 mg 100 mL/hr over 60 Minutes Intravenous  Once 04/13/23 1014 04/13/23 1215   04/13/23 1015  vancomycin (VANCOCIN) IVPB 1000 mg/200 mL premix  Status:  Discontinued        1,000 mg 200 mL/hr over 60 Minutes Intravenous  Once 04/13/23 1014 04/13/23 1025        DVT prophylaxis: SCDs  Code Status: Full code  Family Communication: Discussed with patient wife at bedside   CONSULTS    Subjective    Denies abdominal pain.  Objective    Physical Examination:   General-appears in no acute distress Heart-S1-S2, regular, no murmur auscultated Lungs-clear to auscultation bilaterally, no wheezing or crackles auscultated Abdomen-soft, nontender, no organomegaly, ileal conduit, Foley bag in place Extremities-no edema in the lower extremities Neuro-alert, oriented x3, no focal deficit noted  Status is: Inpatient:             Luis [name]   [facility] If 7PM-7AM, please contact night-coverage at [email or web address], Office  [phone]   04/16/2023, 1:47 PM  LOS: 3 days

## 2023-04-16 NOTE — Anesthesia Preprocedure Evaluation (Addendum)
Anesthesia Evaluation  Patient identified by MRN, date of birth, ID band Patient awake    History of Anesthesia Complications (+) history of anesthetic complications (difficulty waking up after gallbladder surgery ("wild"))  Airway Mallampati: III  TM Distance: >3 FB Neck ROM: Full   Comment: Previous grade I view with MAC 4, easy mask Dental  (+) Dental Advisory Given Only has 3 teeth, all on the top. Denies them being loose.:   Pulmonary neg shortness of breath, sleep apnea and Continuous Positive Airway Pressure Ventilation , neg COPD, neg recent URI, former smoker   Pulmonary exam normal breath sounds clear to auscultation       Cardiovascular hypertension, Pt. on home beta blockers (-) angina + CAD, + Past MI (2012), + Cardiac Stents (2012) and +CHF  + dysrhythmias (postop from AVR) Atrial Fibrillation + Valvular Problems/Murmurs (s/p AVR 09/16/2021) AS  Rhythm:Regular Rate:Normal  HLD, ascending aortic aneurysm, carotid stenosis, s/p pericardial window 09/28/2021  TTE  11/24/2022: IMPRESSIONS  1. Left ventricular ejection fraction, by estimation, is 60 to 65%. The  left ventricle has normal function. The left ventricle has no regional  wall motion abnormalities. There is mild left ventricular hypertrophy.  Left ventricular diastolic parameters  were normal.   2. Right ventricular systolic function is normal. The right ventricular  size is normal. Tricuspid regurgitation signal is inadequate for assessing  PA pressure.   3. The mitral valve is normal in structure. No evidence of mitral valve  regurgitation.   4. No significant PVL.[name] The aortic valve was not well visualized. Aortic  valve regurgitation is not visualized. There is a 25 mm Edwards Inspiris  Resilia valve present in the aortic position. Procedure Date: 09/16/21.   5. The inferior vena cava is normal in size with greater than 50%  respiratory variability, suggesting  right atrial pressure of 3 mmHg.     Neuro/Psych neg Seizures PSYCHIATRIC DISORDERS Anxiety Depression    vertigo    GI/Hepatic Neg liver ROS,GERD  ,,  Endo/Other  neg diabetes  Class 3 obesityPre-diabetes  Renal/GU negative Renal ROS   Bladder cancer    Musculoskeletal  (+) Arthritis ,    Abdominal  (+) + obese  Peds  Hematology negative hematology ROS (+)   Anesthesia Other Findings   Reproductive/Obstetrics                             Anesthesia Physical Anesthesia Plan  ASA: 4  Anesthesia Plan: MAC   Post-op Pain Management: Minimal or no pain anticipated   Induction: Intravenous  PONV Risk Score and Plan: 2 and Treatment may vary due to age or medical condition and Propofol infusion  Airway Management Planned: Natural Airway, Simple Face Mask and Mask  Additional Equipment: None  Intra-op Plan:   Post-operative Plan:   Informed Consent: I have reviewed the patients History and Physical, chart, labs and discussed the procedure including the risks, benefits and alternatives for the proposed anesthesia with the patient or authorized representative who has indicated his/her understanding and acceptance.     Dental advisory given  Plan Discussed with: CRNA and Anesthesiologist  Anesthesia Plan Comments: ( )        Anesthesia Quick Evaluation

## 2023-04-16 NOTE — Plan of Care (Signed)
  Problem: Clinical Measurements: Goal: Ability to maintain clinical measurements within normal limits will improve Outcome: Progressing Goal: Respiratory complications will improve Outcome: Progressing   Problem: Activity: Goal: Risk for activity intolerance will decrease Outcome: Progressing   Problem: Nutrition: Goal: Adequate nutrition will be maintained Outcome: Progressing

## 2023-04-16 NOTE — Progress Notes (Signed)
PHARMACY NOTE:  ANTIMICROBIAL RENAL DOSAGE ADJUSTMENT  Current antimicrobial regimen includes a mismatch between antimicrobial dosage and estimated renal function.  As per policy approved by the Pharmacy & Therapeutics and Medical Executive Committees, the antimicrobial dosage will be adjusted accordingly.  Current antimicrobial dosage:  ampicillin 2 gm IV q6h  Indication: Enterococcal bacteremia   Renal Function:  Estimated Creatinine Clearance: 67 mL/min (A) (by C-G formula based on SCr of 1.28 mg/dL (H)).    Antimicrobial dosage has been changed to:  ampicillin 2 gm IV q4h   Thank you for allowing pharmacy to be a part of this patient's care.  Herby Abraham, Pharm.D Use secure chat for questions 04/16/2023 11:23 AM

## 2023-04-17 ENCOUNTER — Inpatient Hospital Stay: Payer: PPO | Admitting: Hematology and Oncology

## 2023-04-17 ENCOUNTER — Inpatient Hospital Stay: Payer: PPO

## 2023-04-17 ENCOUNTER — Inpatient Hospital Stay (HOSPITAL_COMMUNITY): Payer: Self-pay | Admitting: Anesthesiology

## 2023-04-17 ENCOUNTER — Encounter (HOSPITAL_COMMUNITY)
Admission: EM | Disposition: A | Discharge status: Home / Self Care | Payer: Self-pay | Source: Home / Self Care | Attending: Family Medicine

## 2023-04-17 ENCOUNTER — Inpatient Hospital Stay (HOSPITAL_COMMUNITY): Payer: PPO

## 2023-04-17 ENCOUNTER — Other Ambulatory Visit: Payer: Self-pay

## 2023-04-17 DIAGNOSIS — R7881 Bacteremia: Secondary | ICD-10-CM | POA: Diagnosis not present

## 2023-04-17 DIAGNOSIS — Z8551 Personal history of malignant neoplasm of bladder: Secondary | ICD-10-CM | POA: Diagnosis not present

## 2023-04-17 DIAGNOSIS — R579 Shock, unspecified: Secondary | ICD-10-CM | POA: Diagnosis not present

## 2023-04-17 DIAGNOSIS — I340 Nonrheumatic mitral (valve) insufficiency: Secondary | ICD-10-CM | POA: Diagnosis not present

## 2023-04-17 DIAGNOSIS — Z95818 Presence of other cardiac implants and grafts: Secondary | ICD-10-CM

## 2023-04-17 DIAGNOSIS — Z229 Carrier of infectious disease, unspecified: Secondary | ICD-10-CM | POA: Diagnosis not present

## 2023-04-17 DIAGNOSIS — I509 Heart failure, unspecified: Secondary | ICD-10-CM

## 2023-04-17 DIAGNOSIS — I110 Hypertensive heart disease with heart failure: Secondary | ICD-10-CM

## 2023-04-17 DIAGNOSIS — N179 Acute kidney failure, unspecified: Secondary | ICD-10-CM | POA: Diagnosis not present

## 2023-04-17 DIAGNOSIS — B952 Enterococcus as the cause of diseases classified elsewhere: Secondary | ICD-10-CM | POA: Diagnosis not present

## 2023-04-17 HISTORY — PX: TRANSESOPHAGEAL ECHOCARDIOGRAM (CATH LAB): EP1270

## 2023-04-17 LAB — GLUCOSE, CAPILLARY
Glucose-Capillary: 98 mg/dL (ref 70–99)
Glucose-Capillary: 97 mg/dL (ref 70–99)
Glucose-Capillary: 114 mg/dL — ABNORMAL HIGH (ref 70–99)
Glucose-Capillary: 97 mg/dL (ref 70–99)
Glucose-Capillary: 99 mg/dL (ref 70–99)

## 2023-04-17 LAB — CBC
HCT: 29 % — ABNORMAL LOW (ref 39.0–52.0)
Hemoglobin: 8.6 g/dL — ABNORMAL LOW (ref 13.0–17.0)
MCH: 26.7 pg (ref 26.0–34.0)
MCHC: 29.7 g/dL — ABNORMAL LOW (ref 30.0–36.0)
MCV: 90.1 fL (ref 80.0–100.0)
Platelets: 167 10*3/uL (ref 150–400)
RBC: 3.22 MIL/uL — ABNORMAL LOW (ref 4.22–5.81)
RDW: 14.9 % (ref 11.5–15.5)
WBC: 5.4 10*3/uL (ref 4.0–10.5)
nRBC: 0 % (ref 0.0–0.2)

## 2023-04-17 LAB — BASIC METABOLIC PANEL
Anion gap: 9 (ref 5–15)
BUN: 11 mg/dL (ref 8–23)
CO2: 23 mmol/L (ref 22–32)
Calcium: 8.2 mg/dL — ABNORMAL LOW (ref 8.9–10.3)
Chloride: 110 mmol/L (ref 98–111)
Creatinine, Ser: 1.13 mg/dL (ref 0.61–1.24)
GFR, Estimated: >60 mL/min (ref >60)
Glucose, Bld: 102 mg/dL — ABNORMAL HIGH (ref 70–99)
Potassium: 3.5 mmol/L (ref 3.5–5.1)
Sodium: 142 mmol/L (ref 135–145)

## 2023-04-17 LAB — ECHO TEE
AV Mean grad: 2 mm[Hg]
AV Peak grad: 3.2 mm[Hg]
Ao pk vel: 0.89 m/s

## 2023-04-17 SURGERY — TRANSESOPHAGEAL ECHOCARDIOGRAM (TEE) (CATHLAB)
Anesthesia: Monitor Anesthesia Care

## 2023-04-17 MED ORDER — SODIUM CHLORIDE 0.9% FLUSH: 10.0000 mL | INTRAVENOUS | Status: DC | PRN

## 2023-04-17 MED ORDER — PROPOFOL 10 MG/ML IV BOLUS
INTRAVENOUS | Status: DC | PRN
  Administered 2023-04-17 (×2): 30 mg via INTRAVENOUS
  Administered 2023-04-17: 50 mg via INTRAVENOUS
  Administered 2023-04-17: 30 mg via INTRAVENOUS

## 2023-04-17 MED ORDER — GLYCOPYRROLATE PF 0.2 MG/ML IJ SOSY
PREFILLED_SYRINGE | INTRAMUSCULAR | Status: DC | PRN
  Administered 2023-04-17: .2 mg via INTRAVENOUS

## 2023-04-17 MED ORDER — SODIUM CHLORIDE 0.9% FLUSH
10.0000 mL | Freq: Two times a day (BID) | INTRAVENOUS | Status: DC
  Administered 2023-04-17 – 2023-04-18 (×2): 10 mL

## 2023-04-17 MED ORDER — LIDOCAINE 2% (20 MG/ML) 5 ML SYRINGE
INTRAMUSCULAR | Status: DC | PRN
  Administered 2023-04-17: 100 mg via INTRAVENOUS

## 2023-04-17 MED ORDER — SODIUM CHLORIDE 0.9% FLUSH
10.0000 mL | Freq: Two times a day (BID) | INTRAVENOUS | Status: DC
  Administered 2023-04-17: 10 mL

## 2023-04-17 MED ORDER — EPHEDRINE SULFATE-NACL 50-0.9 MG/10ML-% IV SOSY
PREFILLED_SYRINGE | INTRAVENOUS | Status: DC | PRN
  Administered 2023-04-17 (×2): 10 mg via INTRAVENOUS
  Administered 2023-04-17: 5 mg via INTRAVENOUS

## 2023-04-17 MED ORDER — AMPICILLIN IV (FOR PTA / DISCHARGE USE ONLY): 12.0000 g | INTRAVENOUS | 0 refills | Status: AC

## 2023-04-17 MED ORDER — PHENYLEPHRINE 80 MCG/ML (10ML) SYRINGE FOR IV PUSH (FOR BLOOD PRESSURE SUPPORT)
PREFILLED_SYRINGE | INTRAVENOUS | Status: DC | PRN
  Administered 2023-04-17: 160 ug via INTRAVENOUS

## 2023-04-17 NOTE — Progress Notes (Addendum)
Subjective: No new complaints   Antibiotics:  Anti-infectives (From admission, onward)    Start     Dose/Rate Route Frequency Ordered Stop   04/16/23 1200  ampicillin (OMNIPEN) 2 g in sodium chloride 0.9 % 100 mL IVPB        2 g 300 mL/hr over 20 Minutes Intravenous Every 4 hours 04/16/23 1121     04/14/23 1030  cefTRIAXone (ROCEPHIN) 2 g in sodium chloride 0.9 % 100 mL IVPB  Status:  Discontinued        2 g 200 mL/hr over 30 Minutes Intravenous Every 12 hours 04/14/23 0938 04/17/23 1043   04/14/23 0030  ampicillin (OMNIPEN) 2 g in sodium chloride 0.9 % 100 mL IVPB  Status:  Discontinued        2 g 300 mL/hr over 20 Minutes Intravenous Every 6 hours 04/13/23 2332 04/16/23 1121   04/13/23 2300  ceFEPIme (MAXIPIME) 2 g in sodium chloride 0.9 % 100 mL IVPB  Status:  Discontinued        2 g 200 mL/hr over 30 Minutes Intravenous Every 12 hours 04/13/23 1727 04/13/23 2331   04/13/23 1100  vancomycin (VANCOREADY) IVPB 2000 mg/400 mL        2,000 mg 200 mL/hr over 120 Minutes Intravenous  Once 04/13/23 1025 04/13/23 1354   04/13/23 1015  ceFEPIme (MAXIPIME) 2 g in sodium chloride 0.9 % 100 mL IVPB        2 g 200 mL/hr over 30 Minutes Intravenous  Once 04/13/23 1014 04/13/23 1140   04/13/23 1015  metroNIDAZOLE (FLAGYL) IVPB 500 mg        500 mg 100 mL/hr over 60 Minutes Intravenous  Once 04/13/23 1014 04/13/23 1215   04/13/23 1015  vancomycin (VANCOCIN) IVPB 1000 mg/200 mL premix  Status:  Discontinued        1,000 mg 200 mL/hr over 60 Minutes Intravenous  Once 04/13/23 1014 04/13/23 1025       Medications: Scheduled Meds:  aspirin EC  81 mg Oral Daily   Chlorhexidine Gluconate Cloth  6 each Topical Daily   feeding supplement  1 Container Oral TID BM   rosuvastatin  10 mg Oral Daily   venlafaxine XR  150 mg Oral Q breakfast   Continuous Infusions:  ampicillin (OMNIPEN) IV 2 g (04/17/23 1219)   PRN Meds:.acetaminophen, docusate sodium, ondansetron (ZOFRAN) IV,  pneumococcal 20-valent conjugate vaccine, polyethylene glycol    Objective: Weight change: -1.9 kg  Intake/Output Summary (Last 24 hours) at 04/17/2023 1231 Last data filed at 04/17/2023 1143 Gross per 24 hour  Intake 805.6 ml  Output 4000 ml  Net -3194.4 ml   Blood pressure (!) 150/67, pulse (!) 59, temperature 97.9 F (36.6 C), temperature source Oral, resp. rate 14, height 5\' 6"  (1.676 m), weight 119.9 kg, SpO2 96%. Temp:  [97.7 F (36.5 C)-98.7 F (37.1 C)] 97.9 F (36.6 C) (12/23 1029) Pulse Rate:  [48-66] 59 (12/23 0945) Resp:  [1-22] 14 (12/23 0945) BP: (96-178)/(44-84) 150/67 (12/23 0945) SpO2:  [94 %-100 %] 96 % (12/23 0945) Weight:  [119.9 kg] 119.9 kg (12/23 0400)  Physical Exam: Physical Exam Constitutional:      Appearance: He is well-developed.  HENT:     Head: Normocephalic and atraumatic.  Eyes:     Conjunctiva/sclera: Conjunctivae normal.  Cardiovascular:     Rate and Rhythm: Normal rate and regular rhythm.  Pulmonary:     Effort: Pulmonary effort is normal. No respiratory distress.  Breath sounds: No wheezing.  Abdominal:     General: There is no distension.     Palpations: Abdomen is soft.  Musculoskeletal:        General: Normal range of motion.     Cervical back: Normal range of motion and neck supple.  Skin:    General: Skin is warm and dry.  Neurological:     General: No focal deficit present.     Mental Status: He is alert and oriented to person, place, and time.  Psychiatric:        Mood and Affect: Mood normal.        Behavior: Behavior normal.        Thought Content: Thought content normal.        Judgment: Judgment normal.     Ileal conduit is clean  CBC:    BMET Recent Labs    04/16/23 0915 04/17/23 0456  NA 137 142  K 3.2* 3.5  CL 105 110  CO2 23 23  GLUCOSE 126* 102*  BUN 14 11  CREATININE 1.28* 1.13  CALCIUM 8.1* 8.2*     Liver Panel  No results for input(s): "PROT", "ALBUMIN", "AST", "ALT", "ALKPHOS",  "BILITOT", "BILIDIR", "IBILI" in the last 72 hours.      Sedimentation Rate No results for input(s): "ESRSEDRATE" in the last 72 hours. C-Reactive Protein No results for input(s): "CRP" in the last 72 hours.  Micro Results: Recent Results (from the past 720 hours)  Resp panel by RT-PCR (RSV, Flu A&B, Covid) Anterior Nasal Swab     Status: None   Collection Time: 04/13/23 10:14 AM   Specimen: Anterior Nasal Swab  Result Value Ref Range Status   SARS Coronavirus 2 by RT PCR NEGATIVE NEGATIVE Final    Comment: (NOTE) SARS-CoV-2 target nucleic acids are NOT DETECTED.  The SARS-CoV-2 RNA is generally detectable in upper respiratory specimens during the acute phase of infection. The lowest concentration of SARS-CoV-2 viral copies this assay can detect is 138 copies/mL. A negative result does not preclude SARS-Cov-2 infection and should not be used as the sole basis for treatment or other patient management decisions. A negative result may occur with  improper specimen collection/handling, submission of specimen other than nasopharyngeal swab, presence of viral mutation(s) within the areas targeted by this assay, and inadequate number of viral copies(<138 copies/mL). A negative result must be combined with clinical observations, patient history, and epidemiological information. The expected result is Negative.  Fact Sheet for Patients:  BloggerCourse.com  Fact Sheet for Healthcare Providers:  SeriousBroker.it  This test is no t yet approved or cleared by the [place] FDA and  has been authorized for detection and/or diagnosis of SARS-CoV-2 by FDA under an Emergency Use Authorization (EUA). This EUA will remain  in effect (meaning this test can be used) for the duration of the COVID-19 declaration under Section 564(b)(1) of the Act, 21 U.S.C.section 360bbb-3(b)(1), unless the authorization is terminated  or revoked sooner.        Influenza A by PCR NEGATIVE NEGATIVE Final   Influenza B by PCR NEGATIVE NEGATIVE Final    Comment: (NOTE) The Xpert Xpress SARS-CoV-2/FLU/RSV plus assay is intended as an aid in the diagnosis of influenza from Nasopharyngeal swab specimens and should not be used as a sole basis for treatment. Nasal washings and aspirates are unacceptable for Xpert Xpress SARS-CoV-2/FLU/RSV testing.  Fact Sheet for Patients: BloggerCourse.com  Fact Sheet for Healthcare Providers: SeriousBroker.it  This test is not yet approved or  cleared by the [place] and has been authorized for detection and/or diagnosis of SARS-CoV-2 by FDA under an Emergency Use Authorization (EUA). This EUA will remain in effect (meaning this test can be used) for the duration of the COVID-19 declaration under Section 564(b)(1) of the Act, 21 U.S.C. section 360bbb-3(b)(1), unless the authorization is terminated or revoked.     Resp Syncytial Virus by PCR NEGATIVE NEGATIVE Final    Comment: (NOTE) Fact Sheet for Patients: BloggerCourse.com  Fact Sheet for Healthcare Providers: SeriousBroker.it  This test is not yet approved or cleared by the [place] FDA and has been authorized for detection and/or diagnosis of SARS-CoV-2 by FDA under an Emergency Use Authorization (EUA). This EUA will remain in effect (meaning this test can be used) for the duration of the COVID-19 declaration under Section 564(b)(1) of the Act, 21 U.S.C. section 360bbb-3(b)(1), unless the authorization is terminated or revoked.  Performed at [facility], [address] [place], [place] [address]   Urine Culture     Status: Abnormal   Collection Time: 04/13/23 10:14 AM   Specimen: Urine, Random  Result Value Ref Range Status   Specimen Description   Final    URINE, RANDOM Performed at [facility], [address], [place], [place] [address]    Special Requests   Final    NONE Reflexed from [id number] Performed at [facility], [address], [place], [place] [address]    Culture (A)  Final    >=100,000 COLONIES/mL ENTEROCOCCUS FAECALIS >=100,000 COLONIES/mL STENOTROPHOMONAS MALTOPHILIA    Report Status 04/15/2023 FINAL  Final   Organism ID, Bacteria ENTEROCOCCUS FAECALIS (A)  Final   Organism ID, Bacteria STENOTROPHOMONAS MALTOPHILIA (A)  Final      Susceptibility   Enterococcus faecalis - MIC*    AMPICILLIN <=2 SENSITIVE Sensitive     NITROFURANTOIN <=16 SENSITIVE Sensitive     VANCOMYCIN 1 SENSITIVE Sensitive     * >=100,000 COLONIES/mL ENTEROCOCCUS FAECALIS   Stenotrophomonas maltophilia - MIC*    LEVOFLOXACIN 0.25 SENSITIVE Sensitive     TRIMETH/SULFA <=20 SENSITIVE Sensitive     * >=100,000 COLONIES/mL STENOTROPHOMONAS MALTOPHILIA  Blood Culture (routine x 2)     Status: Abnormal   Collection Time: 04/13/23 10:24 AM   Specimen: BLOOD  Result Value Ref Range Status   Specimen Description   Final    BLOOD RIGHT ANTECUBITAL Performed at [facility], [address] [place], [place] [address]    Special Requests   Final    BOTTLES DRAWN AEROBIC AND ANAEROBIC Blood Culture adequate volume Performed at [facility], [address] [place], [place] [address]    Culture  Setup Time   Final    GRAM POSITIVE COCCI IN CHAINS IN BOTH AEROBIC AND ANAEROBIC BOTTLES CRITICAL RESULT CALLED TO, READ BACK BY AND VERIFIED WITH: PHARMD L. Bernarda Caffey [id number] @ 2313 FH Performed at [facility], [address], [place], [place] [address]    Culture ENTEROCOCCUS FAECALIS (A)  Final   Report Status 04/16/2023 FINAL  Final   Organism ID, Bacteria ENTEROCOCCUS FAECALIS  Final      Susceptibility   Enterococcus faecalis - MIC*    AMPICILLIN <=2 SENSITIVE Sensitive     VANCOMYCIN 1 SENSITIVE Sensitive      GENTAMICIN SYNERGY SENSITIVE Sensitive     * ENTEROCOCCUS FAECALIS  Blood Culture ID Panel (Reflexed)     Status: Abnormal   Collection  Time: 04/13/23 10:24 AM  Result Value Ref Range Status   Enterococcus faecalis DETECTED (A) NOT DETECTED Final    Comment: CRITICAL RESULT CALLED TO, READ BACK BY AND VERIFIED WITH: PHARMD L. Bernarda Caffey [id number] @ 2313 FH    Enterococcus Faecium NOT DETECTED NOT DETECTED Final   Listeria monocytogenes NOT DETECTED NOT DETECTED Final   Staphylococcus species NOT DETECTED NOT DETECTED Final   Staphylococcus aureus (BCID) NOT DETECTED NOT DETECTED Final   Staphylococcus epidermidis NOT DETECTED NOT DETECTED Final   Staphylococcus lugdunensis NOT DETECTED NOT DETECTED Final   Streptococcus species NOT DETECTED NOT DETECTED Final   Streptococcus agalactiae NOT DETECTED NOT DETECTED Final   Streptococcus pneumoniae NOT DETECTED NOT DETECTED Final   Streptococcus pyogenes NOT DETECTED NOT DETECTED Final   A.calcoaceticus-baumannii NOT DETECTED NOT DETECTED Final   Bacteroides fragilis NOT DETECTED NOT DETECTED Final   Enterobacterales NOT DETECTED NOT DETECTED Final   Enterobacter cloacae complex NOT DETECTED NOT DETECTED Final   Escherichia coli NOT DETECTED NOT DETECTED Final   Klebsiella aerogenes NOT DETECTED NOT DETECTED Final   Klebsiella oxytoca NOT DETECTED NOT DETECTED Final   Klebsiella pneumoniae NOT DETECTED NOT DETECTED Final   Proteus species NOT DETECTED NOT DETECTED Final   Salmonella species NOT DETECTED NOT DETECTED Final   Serratia marcescens NOT DETECTED NOT DETECTED Final   Haemophilus influenzae NOT DETECTED NOT DETECTED Final   Neisseria meningitidis NOT DETECTED NOT DETECTED Final   Pseudomonas aeruginosa NOT DETECTED NOT DETECTED Final   Stenotrophomonas maltophilia NOT DETECTED NOT DETECTED Final   Candida albicans NOT DETECTED NOT DETECTED Final   Candida auris NOT DETECTED NOT DETECTED Final   Candida glabrata NOT DETECTED NOT  DETECTED Final   Candida krusei NOT DETECTED NOT DETECTED Final   Candida parapsilosis NOT DETECTED NOT DETECTED Final   Candida tropicalis NOT DETECTED NOT DETECTED Final   Cryptococcus neoformans/gattii NOT DETECTED NOT DETECTED Final   Vancomycin resistance NOT DETECTED NOT DETECTED Final    Comment: Performed at [facility], [address] [place], [place] [address]  Blood Culture (routine x 2)     Status: Abnormal   Collection Time: 04/13/23 10:27 AM   Specimen: BLOOD LEFT HAND  Result Value Ref Range Status   Specimen Description   Final    BLOOD LEFT HAND Performed at [facility], [address] [place], [place] [address]    Special Requests   Final    BOTTLES DRAWN AEROBIC AND ANAEROBIC Blood Culture adequate volume Performed at [facility] [address] [place], [place] [address]    Culture  Setup Time   Final    GRAM POSITIVE COCCI IN CHAINS IN BOTH AEROBIC AND ANAEROBIC BOTTLES    Culture (A)  Final    ENTEROCOCCUS FAECALIS SUSCEPTIBILITIES PERFORMED ON PREVIOUS CULTURE WITHIN THE LAST 5 DAYS. Performed at [facility], [address] [place], [place] [address]    Report Status 04/16/2023 FINAL  Final  MRSA Next Gen by PCR, Nasal     Status: None   Collection Time: 04/13/23  5:27 PM   Specimen: Nasal Mucosa; Nasal Swab  Result Value Ref Range Status   MRSA by PCR Next Gen NOT DETECTED NOT DETECTED Final    Comment: (NOTE) The GeneXpert MRSA Assay (FDA approved for NASAL specimens only), is one component of a comprehensive MRSA colonization surveillance program. It is not intended to diagnose MRSA infection nor to guide or monitor treatment for  MRSA infections. Test performance is not FDA approved in patients less than 91 years old. Performed at [facility], [address] [place], [place] [address]   Culture, blood (Routine X 2) w Reflex to ID Panel     Status: None (Preliminary result)    Collection Time: 04/15/23  8:16 AM   Specimen: BLOOD RIGHT ARM  Result Value Ref Range Status   Specimen Description   Final    BLOOD RIGHT ARM Performed at [facility], [address] [place], [place] [address]    Special Requests   Final    BOTTLES DRAWN AEROBIC AND ANAEROBIC Blood Culture results may not be optimal due to an inadequate volume of blood received in culture bottles Performed at [facility], [address], [place], [place] [address]    Culture   Final    NO GROWTH 2 DAYS Performed at [facility], [address], [place], [place] [address]    Report Status PENDING  Incomplete  Culture, blood (Routine X 2) w Reflex to ID Panel     Status: None (Preliminary result)   Collection Time: 04/15/23  8:29 AM   Specimen: BLOOD RIGHT HAND  Result Value Ref Range Status   Specimen Description   Final    BLOOD RIGHT HAND Performed at [facility], [address] [place], [place] [address]    Special Requests   Final    BOTTLES DRAWN AEROBIC AND ANAEROBIC Blood Culture results may not be optimal due to an inadequate volume of blood received in culture bottles Performed at [facility], [address] [place], [place] [address]    Culture   Final    NO GROWTH 2 DAYS Performed at [facility], [address], [place], [place] [address]    Report Status PENDING  Incomplete    Studies/Results: [place] EKG SITE RITE Result Date: 04/17/2023 If Site Rite image not attached, placement could not be confirmed due to current cardiac rhythm.  EP STUDY Result Date: 04/17/2023 See surgical note for result.     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 male with  hx of AVR, aortic graft placement , bladder cancer sp TURBT chemotherapy, recent ileal conduit, with plan for further chemotherapy but now admission with septic shock to the ICU due to E faecalis bacteremia   #1 E faecalis Bacteremia, rule out Native/Prosthetic valve endocarditis, aortic graft infection  TEE clean and dropping ceftriaxone at present  I do want to double check with Luis [name] re whether the aortic graft was well visualized or whether we should also get a CTA to assess this in case it is infected  Repeat cultures negative and will place PICC and prelim OPAT (though if find out graft is infected will change duration)    I have personally spent 50 minutes involved in face-to-face and non-face-to-face activities for this patient on the day of the visit. Professional time spent includes the following activities: Preparing to see the patient (review of tests), Obtaining and/or reviewing separately obtained history (admission/discharge record), Performing a medically appropriate examination and/or evaluation , Ordering medications/tests/procedures, referring and communicating  with other health care professionals, Documenting clinical information in the EMR, Independently interpreting results (not separately reported), Communicating results to the patient/family/caregiver, Counseling and educating the patient/family/caregiver and Care coordination (not separately reported).    We are getting CTA to check rest of her Aortic graft out. The part seen on TEE was clean per Dr. Anne Fu  If she does not have graft infection plan is as below  Diagnosis: E faecalis bacteremia  Culture Result: E faecalis  No Known Allergies  OPAT Orders Discharge antibiotics to be given via PICC line Discharge antibiotics:  Ampicillin 12 gm every 24 hours as a continuous infusion    Duration: 4 weeks End Date: 05/12/2023  [facility] Care Per  Protocol:  Home health RN for IV administration and teaching; PICC line care and labs.    Labs weekly while on IV antibiotics: _x_ CBC with differential _x_ BMP __ CMP __ CRP __ ESR __ Vancomycin trough __ CK  _x_ Please pull PIC at completion of IV antibiotics __ Please leave PIC in place until doctor has seen patient or been notified  Fax weekly labs to [phone]  Clinic Follow Up Appt:    Luis [name] has an appointment on  05/03/2022 at [facility] with Dr. Daiva Eves at  [facility], which  is located in the [facility] at  [address] in [place].  [address], which is located to the left of the elevators.  Phone: [phone]  Fax: [phone]  [email or web address]  The patient should arrive 30 minutes prior to their appoitment.    LOS: 4 days   Acey Lav 04/17/2023, 12:31 PM

## 2023-04-17 NOTE — CV Procedure (Signed)
   Transesophageal Echocardiogram  Indications:Bacteremia  Time out performed  During this procedure the patient was administered propofol under anesthesiology supervision to achieve and maintain moderate sedation.  The patient's heart rate, blood pressure, and oxygen saturation are monitored continuously during the procedure.   Findings:  Left Ventricle: EF 65%, normal  Mitral Valve: Mild mitral valve regurgitation, no vegetation  Aortic Valve: Aortic valve replacement, bioprosthetic normal.  No obstructive gradient.  No leak, no vegetation  Tricuspid Valve: No significant tricuspid regurgitation, normal, no vegetation  Left Atrium: Normal, no left atrial appendage thrombus  Right Atrium: Normal  Intraatrial septum: Normal, no shunt by Doppler  Bubble Contrast Study: Not performed  Impression: No evidence of endocarditis or valvular vegetation  Donato Schultz, MD

## 2023-04-17 NOTE — Progress Notes (Signed)
   04/17/23 2252  BiPAP/CPAP/SIPAP  Reason BIPAP/CPAP not in use Non-compliant

## 2023-04-17 NOTE — Plan of Care (Signed)
  Problem: Coping: Goal: Level of anxiety will decrease Outcome: Progressing   Problem: Elimination: Goal: Will not experience complications related to urinary retention Outcome: Progressing   Problem: Pain Management: Goal: General experience of comfort will improve Outcome: Progressing   Problem: Safety: Goal: Ability to remain free from injury will improve Outcome: Progressing   Problem: Skin Integrity: Goal: Risk for impaired skin integrity will decrease Outcome: Progressing

## 2023-04-17 NOTE — Progress Notes (Signed)
PHARMACY CONSULT NOTE FOR:  OUTPATIENT  PARENTERAL ANTIBIOTIC THERAPY (OPAT)  Indication: E faecalis bacteremia Regimen: Ampicillin 12 gm every 24 hours as a continuous infusion  End date: 05/12/23   IV antibiotic discharge orders are pended. To discharging provider:  please sign these orders via discharge navigator,  Select New Orders & click on the button choice - Manage This Unsigned Work.     Thank you for allowing pharmacy to be a part of this patient's care.  Sharin Mons, PharmD, BCPS, BCIDP Infectious Diseases Clinical Pharmacist Phone: [phone] 04/17/2023, 12:36 PM

## 2023-04-17 NOTE — Progress Notes (Signed)
[place] ECG image for PICC placement verification printed and placed on the chart.

## 2023-04-17 NOTE — Progress Notes (Signed)
[facility]                                                                              Luis [name], is a 67 y.o. male, DOB - 1956-04-09, WUJ:[id number] Admit date - 04/13/2023    Outpatient Primary MD for the patient is Nelwyn Salisbury, MD  LOS - 4  days  Chief Complaint  Patient presents with   Weakness       Brief summary   Patient is a 67 year old male with bladder cancer (dx 2020 s/p TURBT and completion of 8 cycles of pembrolizumab (03/03/23) who was sent from urology office with concern for sepsis. Followed by Dr. [name] with oncology, tentatively scheduled to start gemcitabine and cisplatin after Christmas given positive margins and spread to lymph nodes.  Patient presented from home with 2 to 3 weeks of progressive generalized weakness, decreased PO intake, and 28lb weight loss since under going total cystectomy with ileal conduit and prostatectomy Nov 8 w/ Dr. [name]  CT a/p showed no evidence of postoperative abscess or extravasation or acute abnormality; existing right sided nephroureteral stent well positioned. Urology was consulted, pan-cultured and started on empiric vanc, cefepime, and flagyl    Assessment & Plan    Principal Problem: Septic shock with Enterococcus faecalis bacteremia, POA --In the setting of prosthetic aortic valve.  Patient was initially admitted by CCM, placed on vasopressors. -Patient was placed on empirically IV vancomycin, cefepime, Flagyl, ID was consulted -2D echo showed EF of 65 to 70%, LVH, -Underwent TEE today which showed no evidence of endocarditis or valvular vegetation -ID following, discontinued IV ceftriaxone.  Placed on IV ampicillin -PICC line today   Acute kidney injury -Creatinine on admission 2.42, 1.5 on 03/31/2023, 0.9 on 11/18. -Likely prerenal in setting of poor p.o. intake, diuretic use, septic shock -Creatinine improving, 1.1, appears to be at baseline   Hypokalemia -Replaced as needed    Normocytic anemia -Likely dilutional, baseline hemoglobin 9-10 -H&H improving   Hypertension -BP stable now, initially admitted with hypotension and septic shock -On Coreg and Lasix at home, currently on hold  -Also noted bradycardia with first-degree AV block, for now continue to hold Coreg   Moderate protein calorie malnutrition - per patient 28 pound weight loss since 11/8 -Consult dietitian   OSA on CPAP - CPAP nightly     Metastatic Bladder cancer s/p total cystectomy with ileal conduit and prostatectomy Nov 8 w/ Dr. [name] w/urology - Urology following, appreciate input - followed by Dr. [name], f/u oncology outpt, tentative plans to start gemcitabine and cisplatin      Depression/ anxiety -Self hold due to acute kidney injury   Hyperlipidemia -Continue rosuvastatin    Obesity Estimated body mass index is 42.66 kg/m as calculated from the following:   Height as of this encounter: 5\' 6"  (1.676 m).   Weight as of this encounter: 119.9 kg.  Code Status: Full code DVT Prophylaxis:  SCDs Start: 04/13/23 1626   Level of Care: Level of care: ICU Family Communication: Updated patient's wife at the bedside Disposition Plan:      Remains inpatient appropriate: Transfer to telemetry floor  Procedures:  TEE  Consultants:   Cardiology ID Urology  Antimicrobials:   Anti-infectives (From admission, onward)    Start     Dose/Rate Route Frequency Ordered Stop   04/16/23 1200  ampicillin (OMNIPEN) 2 g in sodium chloride 0.9 % 100 mL IVPB        2 g 300 mL/hr over 20 Minutes Intravenous Every 4 hours 04/16/23 1121     04/14/23 1030  cefTRIAXone (ROCEPHIN) 2 g in sodium chloride 0.9 % 100 mL IVPB  Status:  Discontinued        2 g 200 mL/hr over 30 Minutes Intravenous Every 12 hours 04/14/23 0938 04/17/23 1043   04/14/23 0030  ampicillin (OMNIPEN) 2 g in sodium chloride 0.9 % 100 mL IVPB  Status:  Discontinued        2 g 300 mL/hr over 20 Minutes Intravenous Every 6  hours 04/13/23 2332 04/16/23 1121   04/13/23 2300  ceFEPIme (MAXIPIME) 2 g in sodium chloride 0.9 % 100 mL IVPB  Status:  Discontinued        2 g 200 mL/hr over 30 Minutes Intravenous Every 12 hours 04/13/23 1727 04/13/23 2331   04/13/23 1100  vancomycin (VANCOREADY) IVPB 2000 mg/400 mL        2,000 mg 200 mL/hr over 120 Minutes Intravenous  Once 04/13/23 1025 04/13/23 1354   04/13/23 1015  ceFEPIme (MAXIPIME) 2 g in sodium chloride 0.9 % 100 mL IVPB        2 g 200 mL/hr over 30 Minutes Intravenous  Once 04/13/23 1014 04/13/23 1140   04/13/23 1015  metroNIDAZOLE (FLAGYL) IVPB 500 mg        500 mg 100 mL/hr over 60 Minutes Intravenous  Once 04/13/23 1014 04/13/23 1215   04/13/23 1015  vancomycin (VANCOCIN) IVPB 1000 mg/200 mL premix  Status:  Discontinued        1,000 mg 200 mL/hr over 60 Minutes Intravenous  Once 04/13/23 1014 04/13/23 1025          Medications  aspirin EC  81 mg Oral Daily   Chlorhexidine Gluconate Cloth  6 each Topical Daily   feeding supplement  1 Container Oral TID BM   rosuvastatin  10 mg Oral Daily   venlafaxine XR  150 mg Oral Q breakfast      Subjective:   Luis [name] was seen and examined today.  Seen after TEE, wife at the bedside, no acute complaints except hungry.  patient denies dizziness, chest pain, shortness of breath, abdominal pain, N/V.   Objective:   Vitals:   04/17/23 0930 04/17/23 0941 04/17/23 0945 04/17/23 1029  BP: 138/65 138/64 (!) 150/67   Pulse: (!) 56 (!) 57 (!) 59   Resp: 12 18 14    Temp:    97.9 F (36.6 C)  TempSrc:    Oral  SpO2: 98% 96% 96%   Weight:      Height:        Intake/Output Summary (Last 24 hours) at 04/17/2023 1233 Last data filed at 04/17/2023 1143 Gross per 24 hour  Intake 805.6 ml  Output 4000 ml  Net -3194.4 ml     Wt Readings from Last 3 Encounters:  04/17/23 119.9 kg  03/31/23 117.9 kg  03/13/23 124.5 kg     Exam General: Alert and oriented x 3, NAD Cardiovascular: S1 S2  auscultated,  RRR Respiratory: Clear to auscultation bilaterally, no wheezing, rales or rhonchi Gastrointestinal: Soft, nontender, nondistended, + bowel sounds Ext: no pedal edema  bilaterally Neuro: no new deficits Skin: No rashes Psych: Normal affect  GU: Foley in place with ileal conduit    Data Reviewed:  I have personally reviewed following labs    CBC Lab Results  Component Value Date   WBC 5.4 04/17/2023   RBC 3.22 (L) 04/17/2023   HGB 8.6 (L) 04/17/2023   HCT 29.0 (L) 04/17/2023   MCV 90.1 04/17/2023   MCH 26.7 04/17/2023   PLT 167 04/17/2023   MCHC 29.7 (L) 04/17/2023   RDW 14.9 04/17/2023   LYMPHSABS 1.1 04/15/2023   MONOABS 0.7 04/15/2023   EOSABS 0.2 04/15/2023   BASOSABS 0.0 04/15/2023     Last metabolic panel Lab Results  Component Value Date   NA 142 04/17/2023   K 3.5 04/17/2023   CL 110 04/17/2023   CO2 23 04/17/2023   BUN 11 04/17/2023   CREATININE 1.13 04/17/2023   GLUCOSE 102 (H) 04/17/2023   GFRNONAA >60 04/17/2023   GFRAA >60 08/10/2019   CALCIUM 8.2 (L) 04/17/2023   PHOS 2.5 04/15/2023   PROT 8.9 (H) 04/13/2023   ALBUMIN 3.2 (L) 04/13/2023   BILITOT 1.0 04/13/2023   ALKPHOS 136 (H) 04/13/2023   AST 36 04/13/2023   ALT 32 04/13/2023   ANIONGAP 9 04/17/2023    CBG (last 3)  Recent Labs    04/16/23 2353 04/17/23 0357 04/17/23 1210  GLUCAP 91 98 97      Coagulation Profile: Recent Labs  Lab 04/13/23 1024  INR 1.3*     Radiology Studies: I have personally reviewed the imaging studies  [place] EKG SITE RITE Result Date: 04/17/2023 If Site Rite image not attached, placement could not be confirmed due to current cardiac rhythm.  EP STUDY Result Date: 04/17/2023 See surgical note for result.      Thad Ranger M.D. [facility] 04/17/2023, 12:33 PM  Available via Epic secure chat 7am-7pm After 7 pm, please refer to night coverage provider listed on amion.

## 2023-04-17 NOTE — Evaluation (Signed)
Physical Therapy Evaluation Patient Details Name: Luis [name] MRN: [id number] DOB: Sep 13, 1955 Today's Date: 04/17/2023  History of Present Illness  Pt is a 67 year old male sent to ED  from  urology office 04/13/23 with concerns for sepsis.PMH: s/p Laparoscopic radical cystectomy with prostatectomy, bilateral pelvic lymph node dissection, ileal conduit urinary diversion on 03/03/23, MI,CAD,AVR, neck surgery, TEE 04/17/23  Clinical Impression  Pt admitted with above diagnosis.  Pt currently with functional limitations due to the deficits listed below (see PT Problem List). Pt will benefit from acute skilled PT to increase their independence and safety with mobility to allow discharge.       The patient progressively  improved in gait , tolerated x 600' using RW.  HR 112, SPO2 >95%. BP 130/60. Patient should progress to return home with family support. Patient declines need for  PT after DC.    If plan is discharge home, recommend the following: A little help with bathing/dressing/bathroom;Help with stairs or ramp for entrance;Assist for transportation;Assistance with cooking/housework;A little help with walking and/or transfers   Can travel by private vehicle        Equipment Recommendations None recommended by PT  Recommendations for Other Services       Functional Status Assessment Patient has had a recent decline in their functional status and demonstrates the ability to make significant improvements in function in a reasonable and predictable amount of time.     Precautions / Restrictions Precautions Precautions: Fall Precaution Comments: urostomy Restrictions Weight Bearing Restrictions Per Provider Order: No      Mobility  Bed Mobility Overal bed mobility: Modified Independent                  Transfers Overall transfer level: Needs assistance Equipment used: Rolling walker (2 wheels) Transfers: Sit to/from Stand, Bed to chair/wheelchair/BSC Sit to Stand:  Contact guard assist   Step pivot transfers: Contact guard assist       General transfer comment: patient slightly unsteady upon standing, improved thereafter attempts    Ambulation/Gait Ambulation/Gait assistance: Contact guard assist Gait Distance (Feet): 600 Feet Assistive device: Rolling walker (2 wheels) Gait Pattern/deviations: Step-through pattern Gait velocity: decr     General Gait Details: gait steady  with noted improvement with distance  Stairs            Wheelchair Mobility     Tilt Bed    Modified Rankin (Stroke Patients Only)       Balance Overall balance assessment: Mild deficits observed, not formally tested                                           Pertinent Vitals/Pain Pain Assessment Pain Assessment: No/denies pain    Home Living Family/patient expects to be discharged to:: Private residence Living Arrangements: Spouse/significant other;Other relatives Available Help at Discharge: Family;Available 24 hours/day Type of Home: House Home Access: Stairs to enter   [employer] of Steps: 2- holds onto door   Home Layout: One level Home Equipment: Rollator (4 wheels)      Prior Function Prior Level of Function : Independent/Modified Independent             Mobility Comments: has used rollator recently with progressive weakness       Extremity/Trunk Assessment   Upper Extremity Assessment Upper Extremity Assessment: Overall WFL for tasks assessed    Lower Extremity  Assessment Lower Extremity Assessment: Overall WFL for tasks assessed    Cervical / Trunk Assessment Cervical / Trunk Assessment: Normal;Other exceptions Cervical / Trunk Exceptions: urosotomy  Communication   Communication Communication: No apparent difficulties  Cognition Arousal: Alert Behavior During Therapy: WFL for tasks assessed/performed Overall Cognitive Status: Within Functional Limits for tasks assessed                                           General Comments      Exercises     Assessment/Plan    PT Assessment Patient needs continued PT services  PT Problem List Decreased strength;Decreased mobility       PT Treatment Interventions DME instruction;Therapeutic activities;Gait training;Therapeutic exercise;Patient/family education;Functional mobility training    PT Goals (Current goals can be found in the Care Plan section)  Acute Rehab PT Goals Patient Stated Goal: go home, walk  more PT Goal Formulation: With patient/family Time For Goal Achievement: 05/01/23 Potential to Achieve Goals: Good    Frequency Min 1X/week     Co-evaluation               AM-PAC PT "6 Clicks" Mobility  Outcome Measure Help needed turning from your back to your side while in a flat bed without using bedrails?: None Help needed moving from lying on your back to sitting on the side of a flat bed without using bedrails?: None Help needed moving to and from a bed to a chair (including a wheelchair)?: A Little Help needed standing up from a chair using your arms (e.g., wheelchair or bedside chair)?: A Little Help needed to walk in hospital room?: A Little Help needed climbing 3-5 steps with a railing? : A Little 6 Click Score: 20    End of Session Equipment Utilized During Treatment: Gait belt Activity Tolerance: Patient tolerated treatment well Patient left: in bed;with call bell/phone within reach;with family/visitor present Nurse Communication: Mobility status PT Visit Diagnosis: Unsteadiness on feet (R26.81);Muscle weakness (generalized) (M62.81)    Time: 1660-6301 PT Time Calculation (min) (ACUTE ONLY): 41 min   Charges:   PT Evaluation $PT Eval Low Complexity: 1 Low PT Treatments $Gait Training: 23-37 mins PT General Charges $$ ACUTE PT VISIT: 1 Visit         Blanchard Kelch PT Acute Rehabilitation Services Office [phone] Weekend pager-[phone]   Rada Hay 04/17/2023, 3:00 PM

## 2023-04-17 NOTE — Transfer of Care (Signed)
Immediate Anesthesia Transfer of Care Note  Patient: Luis [name]  Procedure(s) Performed: TRANSESOPHAGEAL ECHOCARDIOGRAM  Patient Location: PACU  Anesthesia Type:General  Level of Consciousness: awake, alert , and oriented  Airway & Oxygen Therapy: Patient Spontanous Breathing and Patient connected to face mask oxygen  Post-op Assessment: Report given to RN and Post -op Vital signs reviewed and stable  Post vital signs: Reviewed and stable  Last Vitals:  Vitals Value Taken Time  BP 169/63 04/17/23 0912  Temp 36.7 C 04/17/23 0911  Pulse 53 04/17/23 0915  Resp 18 04/17/23 0915  SpO2 100 % 04/17/23 0915  Vitals shown include unfiled device data.  Last Pain:  Vitals:   04/17/23 0911  TempSrc:   PainSc: 0-No pain         Complications: No notable events documented.

## 2023-04-17 NOTE — Anesthesia Postprocedure Evaluation (Signed)
Anesthesia Post Note  Patient: ELIRAN GUEDES  Procedure(s) Performed: TRANSESOPHAGEAL ECHOCARDIOGRAM     Patient location during evaluation: PACU Anesthesia Type: MAC Level of consciousness: awake and alert Pain management: pain level controlled Vital Signs Assessment: post-procedure vital signs reviewed and stable Respiratory status: spontaneous breathing, nonlabored ventilation, respiratory function stable and patient connected to nasal cannula oxygen Cardiovascular status: stable and blood pressure returned to baseline Postop Assessment: no apparent nausea or vomiting Anesthetic complications: no   No notable events documented.  Last Vitals:  Vitals:   04/17/23 1029 04/17/23 1200  BP:    Pulse:    Resp:    Temp: 36.6 C 36.6 C  SpO2:      Last Pain:  Vitals:   04/17/23 1200  TempSrc: Oral  PainSc:                  Kimberley Speece

## 2023-04-17 NOTE — Progress Notes (Signed)
Peripherally Inserted Central Catheter Placement  The IV Nurse has discussed with the patient and/or persons authorized to consent for the patient, the purpose of this procedure and the potential benefits and risks involved with this procedure.  The benefits include less needle sticks, lab draws from the catheter, and the patient may be discharged home with the catheter. Risks include, but not limited to, infection, bleeding, blood clot (thrombus formation), and puncture of an artery; nerve damage and irregular heartbeat and possibility to perform a PICC exchange if needed/ordered by physician.  Alternatives to this procedure were also discussed.  Bard Power PICC patient education guide, fact sheet on infection prevention and patient information card has been provided to patient /or left at bedside.  Consent signed by wife at bedside.   PICC Placement Documentation  PICC Single Lumen 04/17/23 Right Basilic 41 cm 0 cm (Active)  Indication for Insertion or Continuance of Line Home intravenous therapies (PICC only) 04/17/23 1531  Exposed Catheter (cm) 0 cm 04/17/23 1531  Site Assessment Clean, Dry, Intact 04/17/23 1531  Dressing Type Transparent;Securing device 04/17/23 1531  Dressing Status Antimicrobial disc in place;Clean, Dry, Intact 04/17/23 1531  Line Care Connections checked and tightened 04/17/23 1531  Line Adjustment (NICU/IV Team Only) No 04/17/23 1531  Dressing Intervention New dressing 04/17/23 1531  Dressing Change Due 04/24/23 04/17/23 1531       Burnard Bunting Chenice 04/17/2023, 3:32 PM

## 2023-04-17 NOTE — Progress Notes (Signed)
  Echocardiogram Echocardiogram Transesophageal has been performed.  Delcie Roch 04/17/2023, 9:23 AM

## 2023-04-17 NOTE — Interval H&P Note (Signed)
History and Physical Interval Note:  04/17/2023 7:29 AM  Luis [name]  has presented today for surgery, with the diagnosis of bacteremia.  The various methods of treatment have been discussed with the patient and family. After consideration of risks, benefits and other options for treatment, the patient has consented to  Procedure(s): TRANSESOPHAGEAL ECHOCARDIOGRAM (N/A) as a surgical intervention.  The patient's history has been reviewed, patient examined, no change in status, stable for surgery.  I have reviewed the patient's chart and labs.  Questions were answered to the patient's satisfaction.     [employer]

## 2023-04-18 ENCOUNTER — Inpatient Hospital Stay (HOSPITAL_COMMUNITY): Payer: PPO

## 2023-04-18 ENCOUNTER — Encounter (HOSPITAL_COMMUNITY): Payer: Self-pay | Admitting: Cardiology

## 2023-04-18 DIAGNOSIS — A498 Other bacterial infections of unspecified site: Secondary | ICD-10-CM

## 2023-04-18 DIAGNOSIS — R579 Shock, unspecified: Secondary | ICD-10-CM | POA: Diagnosis not present

## 2023-04-18 DIAGNOSIS — Z8551 Personal history of malignant neoplasm of bladder: Secondary | ICD-10-CM | POA: Diagnosis not present

## 2023-04-18 DIAGNOSIS — Z229 Carrier of infectious disease, unspecified: Secondary | ICD-10-CM | POA: Diagnosis not present

## 2023-04-18 DIAGNOSIS — R7881 Bacteremia: Secondary | ICD-10-CM | POA: Diagnosis not present

## 2023-04-18 DIAGNOSIS — A419 Sepsis, unspecified organism: Secondary | ICD-10-CM

## 2023-04-18 DIAGNOSIS — N179 Acute kidney failure, unspecified: Secondary | ICD-10-CM | POA: Diagnosis not present

## 2023-04-18 DIAGNOSIS — Z720 Tobacco use: Secondary | ICD-10-CM

## 2023-04-18 DIAGNOSIS — B952 Enterococcus as the cause of diseases classified elsewhere: Secondary | ICD-10-CM | POA: Diagnosis not present

## 2023-04-18 LAB — CBC
HCT: 24.9 % — ABNORMAL LOW (ref 39.0–52.0)
Hemoglobin: 7.8 g/dL — ABNORMAL LOW (ref 13.0–17.0)
MCH: 27.6 pg (ref 26.0–34.0)
MCHC: 31.3 g/dL (ref 30.0–36.0)
MCV: 88 fL (ref 80.0–100.0)
Platelets: 154 10*3/uL (ref 150–400)
RBC: 2.83 MIL/uL — ABNORMAL LOW (ref 4.22–5.81)
RDW: 15 % (ref 11.5–15.5)
WBC: 6 10*3/uL (ref 4.0–10.5)
nRBC: 0 % (ref 0.0–0.2)

## 2023-04-18 LAB — GLUCOSE, CAPILLARY
Glucose-Capillary: 96 mg/dL (ref 70–99)
Glucose-Capillary: 97 mg/dL (ref 70–99)
Glucose-Capillary: 111 mg/dL — ABNORMAL HIGH (ref 70–99)

## 2023-04-18 LAB — BASIC METABOLIC PANEL
Anion gap: 10 (ref 5–15)
BUN: 11 mg/dL (ref 8–23)
CO2: 26 mmol/L (ref 22–32)
Calcium: 8.2 mg/dL — ABNORMAL LOW (ref 8.9–10.3)
Chloride: 106 mmol/L (ref 98–111)
Creatinine, Ser: 0.95 mg/dL (ref 0.61–1.24)
GFR, Estimated: >60 mL/min (ref >60)
Glucose, Bld: 107 mg/dL — ABNORMAL HIGH (ref 70–99)
Potassium: 3.1 mmol/L — ABNORMAL LOW (ref 3.5–5.1)
Sodium: 142 mmol/L (ref 135–145)

## 2023-04-18 MED ORDER — SODIUM CHLORIDE 0.9 % IV BOLUS
250.0000 mL | Freq: Once | INTRAVENOUS | Status: AC
  Administered 2023-04-18: 250 mL via INTRAVENOUS

## 2023-04-18 MED ORDER — POTASSIUM CHLORIDE CRYS ER 20 MEQ PO TBCR
40.0000 meq | EXTENDED_RELEASE_TABLET | Freq: Once | ORAL | Status: AC
  Administered 2023-04-18: 40 meq via ORAL
  Filled 2023-04-18: qty 2

## 2023-04-18 MED ORDER — SODIUM CHLORIDE (PF) 0.9 % IJ SOLN
INTRAMUSCULAR | Status: AC
  Filled 2023-04-18: qty 50

## 2023-04-18 MED ORDER — INFLUENZA VAC A&B SURF ANT ADJ 0.5 ML IM SUSY
0.5000 mL | PREFILLED_SYRINGE | INTRAMUSCULAR | Status: AC
  Administered 2023-04-18: 0.5 mL via INTRAMUSCULAR
  Filled 2023-04-18: qty 0.5

## 2023-04-18 MED ORDER — IOHEXOL 350 MG/ML SOLN
100.0000 mL | Freq: Once | INTRAVENOUS | Status: AC | PRN
  Administered 2023-04-18: 100 mL via INTRAVENOUS

## 2023-04-18 NOTE — Discharge Summary (Signed)
Physician Discharge Summary   Patient: Luis [name] MRN: [id number] DOB: [birth year]  Admit date:     04/13/2023  Discharge date: 04/18/23  Discharge Physician: Thad Ranger, MD    PCP: Nelwyn Salisbury, MD   Recommendations at discharge:   Continue IV ampicillin 12 g every 24 hours, for 4 weeks, end date on 05/12/2023 Outpatient follow-up with ID scheduled Coreg currently on hold due to bradycardia  Discharge Diagnoses:  Septic shock with Enterococcus faecalis bacteremia, POA   Enterococcus faecalis infection   History of prosthetic aortic valve   Normocytic anemia   History of aortic valve replacement with tissue graft   Carrier of multidrug-resistant Stenotrophomonas maltophilia   History of bladder cancer Sinus bradycardia   AKI (acute kidney injury) [facility])   Hospital Course:  Patient is a 67 year old male with bladder cancer (dx 2020 s/p TURBT and completion of 8 cycles of pembrolizumab (03/03/23) who was sent from urology office with concern for sepsis. Followed by Dr. [name] with oncology, tentatively scheduled to start gemcitabine and cisplatin after Christmas given positive margins and spread to lymph nodes.  Patient presented from home with 2 to 3 weeks of progressive generalized weakness, decreased PO intake, and 28lb weight loss since under going total cystectomy with ileal conduit and prostatectomy Nov 8 w/ Dr. [name]  CT a/p showed no evidence of postoperative abscess or extravasation or acute abnormality; existing right sided nephroureteral stent well positioned. Urology was consulted, pan-cultured and started on empiric vanc, cefepime, and flagyl    Assessment and Plan:   Septic shock with Enterococcus faecalis bacteremia, POA --In the setting of prosthetic aortic valve.  Patient was initially admitted by CCM, placed on vasopressors. -Patient was placed on empirically IV vancomycin, cefepime, Flagyl, ID was consulted -2D echo showed EF of 65 to 70%,  LVH, -Underwent TEE which showed no evidence of endocarditis or valvular vegetation -ID was consulted, seen by Dr. Daiva Eves, placed on IV ampicillin for 4 weeks  -PICC line placed -CTA aorta showed no acute findings, mild diffuse bilateral bronchial wall thickening, nonspecific infectious or inflammatory bronchitis -Cleared by ID to discharge home with IV antibiotics, home health RN arranged     Acute kidney injury -Creatinine on admission 2.42, 1.5 on 03/31/2023, 0.9 on 11/18. -Likely prerenal in setting of poor p.o. intake, diuretic use, septic shock -Improved to 0.95 at discharge    Hypokalemia -Replaced    Normocytic anemia -Likely dilutional, baseline hemoglobin 9-10 -Hemoglobin 7.8, received IV fluid hydration, possibly hemodilution, no obvious bleeding   Hypertension -BP stable now, initially admitted with hypotension and septic shock -Continue to hold Coreg, noted bradycardia with first-degree AV block -Patient may resume Lasix in 2 days   Moderate protein calorie malnutrition - per patient 28 pound weight loss since 11/8    OSA on CPAP - CPAP nightly     Metastatic Bladder cancer s/p total cystectomy with ileal conduit and prostatectomy Nov 8 w/ Dr. [name] w/urology - Urology following, outpatient follow-up - followed by Dr. [name], f/u oncology outpt, tentative plans to start gemcitabine and cisplatin      Depression/ anxiety -Continue  Effexor    Hyperlipidemia -Continue rosuvastatin     Obesity Estimated body mass index is 42.66 kg/m as calculated from the following:   Height as of this encounter: 5\' 6"  (1.676 m).   Weight as of this encounter: 119.9 kg.       Pain control - [employer] Controlled Substance Reporting System database was reviewed. and  patient was instructed, not to drive, operate heavy machinery, perform activities at heights, swimming or participation in water activities or provide baby-sitting services while on Pain, Sleep and  Anxiety Medications; until their outpatient Physician has advised to do so again. Also recommended to not to take more than prescribed Pain, Sleep and Anxiety Medications.  Consultants: ID, cardiology, urology, PCCM Procedures performed: TEE Disposition: Home Diet recommendation:  Discharge Diet Orders (From admission, onward)     Start     Ordered   04/18/23 0000  Diet - low sodium heart healthy        04/18/23 1311           DISCHARGE MEDICATION: Allergies as of 04/18/2023   No Known Allergies      Medication List     PAUSE taking these medications    carvedilol 3.125 MG tablet Wait to take this until your doctor or other care provider tells you to start again. Commonly known as: COREG TAKE 1 TABLET BY MOUTH TWICE A DAY WITH FOOD   furosemide 20 MG tablet Wait to take this until: April 20, 2023 Commonly known as: LASIX Take 2 tablets (40 mg total) by mouth 2 (two) times daily.       TAKE these medications    ampicillin IVPB Inject 12 g into the vein daily for 25 days. As a continuous infusion. Indication:  E faecalis bacteremia First Dose: Yes Last Day of Therapy:  05/12/23  Labs - Once weekly:  CBC/D and BMP, Labs - Once weekly: ESR and CRP Method of administration: Ambulatory Pump (Continuous Infusion) Method of administration may be changed at the discretion of home infusion pharmacist based upon assessment of the patient and/or caregiver's ability to self-administer the medication ordered.   aspirin EC 81 MG tablet Take 81 mg by mouth in the morning.   diazepam 5 MG tablet Commonly known as: VALIUM TAKE 1 TABLET BY MOUTH EVERY 12 HOURS AS NEEDED FOR ANXIETY. What changed:  reasons to take this additional instructions   ferrous sulfate 325 (65 FE) MG tablet Take 325 mg by mouth daily with breakfast.   ipratropium 0.03 % nasal spray Commonly known as: ATROVENT Place 2 sprays into both nostrils every 12 (twelve) hours as needed for rhinitis.    Magnesium 500 MG Tabs Take 500 mg by mouth in the morning.   meclizine 25 MG tablet Commonly known as: ANTIVERT Take 1 tablet (25 mg total) by mouth 3 (three) times daily as needed for dizziness.   multivitamin-iron-minerals-folic acid chewable tablet Chew 1 tablet by mouth daily.   nitroGLYCERIN 0.4 MG SL tablet Commonly known as: NITROSTAT Place 0.4 mg under the tongue every 5 (five) minutes as needed for chest pain.   oxyCODONE 5 MG immediate release tablet Commonly known as: Roxicodone Take 1 tablet (5 mg total) by mouth every 6 (six) hours as needed for breakthrough pain (post-operatively).   potassium chloride SA 20 MEQ tablet Commonly known as: Klor-Con M20 Take 2 tablets (40 mEq total) by mouth daily. What changed:  how much to take when to take this   rosuvastatin 20 MG tablet Commonly known as: CRESTOR TAKE 1 TABLET BY MOUTH EVERYDAY AT BEDTIME What changed: See the new instructions.   Tylenol 325 MG tablet Generic drug: acetaminophen Take 650 mg by mouth every 6 (six) hours as needed (for pain).   venlafaxine XR 150 MG 24 hr capsule Commonly known as: EFFEXOR-XR TAKE 1 CAPSULE BY MOUTH DAILY WITH BREAKFAST.  Discharge Care Instructions  (From admission, onward)           Start     Ordered   04/17/23 0000  Change dressing on IV access line weekly and PRN  (Home infusion instructions - [facility] )        04/17/23 1447            Follow-up Information     Nelwyn Salisbury, MD. Schedule an appointment as soon as possible for a visit in 2 week(s).   Specialty: Family Medicine Why: for hospital follow-up Contact information: [address] [place] [address] [phone]         Danelle Earthly, MD Follow up on 05/04/2023.   Specialty: Infectious Diseases Why: at 2:00 PM, for hospital follow-up Contact information: [address], [address] [place] [address] [phone]         Jaci Standard, MD Follow up on 04/20/2023.   Specialty: Hematology and Oncology Why: 3:20 PM for hospital follow-up Contact information: [address] [place] [address] [phone]                Discharge Exam: Filed Weights   04/16/23 0400 04/17/23 0400 04/18/23 0500  Weight: 121.8 kg 119.9 kg 121.6 kg   S: Hoping to go home today, no acute complaints.  Wife at the bedside.  BP (!) 143/60   Pulse (!) 48   Temp 97.7 F (36.5 C) (Oral)   Resp 17   Ht 5\' 6"  (1.676 m)   Wt 121.6 kg   SpO2 98%   BMI 43.27 kg/m   Physical Exam General: Alert and oriented x 3, NAD Cardiovascular: S1 S2 clear, RRR.  Respiratory: CTAB Gastrointestinal: Soft, nontender, nondistended, NBS Ext: no pedal edema bilaterally Neuro: no new deficits Psych: Normal affect    Condition at discharge: fair  The results of significant diagnostics from this hospitalization (including imaging, microbiology, ancillary and laboratory) are listed below for reference.   Imaging Studies: CT ANGIO CHEST AORTA W/CM & OR WO/CM Result Date: 04/18/2023 CLINICAL DATA:  Graft versus host disease EXAM: CT ANGIOGRAPHY CHEST WITH CONTRAST TECHNIQUE: Multidetector CT imaging of the chest was performed using the standard protocol during bolus administration of intravenous contrast. Multiplanar CT image reconstructions and MIPs were obtained to evaluate the vascular anatomy. RADIATION DOSE REDUCTION: This exam was performed according to the departmental dose-optimization program which includes automated exposure control, adjustment of the mA and/or kV according to patient size and/or use of iterative reconstruction technique. CONTRAST:  OMNIPAQUE IOHEXOL 350 MG/ML SOLN COMPARISON:  12/14/2022 FINDINGS: Cardiovascular: Preferential opacification of the thoracic aorta. Status post Bentall type aortic root graft repair with unchanged contour and caliber of both the graft and of the remaining portion of the vessel.  No acute findings. Mild atherosclerosis. Normal heart size. Three-vessel coronary artery calcifications. No pericardial effusion. Right upper extremity PICC. Mediastinum/Nodes: No enlarged mediastinal, hilar, or axillary lymph nodes. Thyroid gland, trachea, and esophagus demonstrate no significant findings. Lungs/Pleura: Mild diffuse bilateral bronchial wall thickening. No pleural effusion or pneumothorax. Upper Abdomen: No acute abnormality. Hepatic steatosis. Status post cholecystectomy. Musculoskeletal: Status post median sternotomy. No acute osseous findings. Review of the MIP images confirms the above findings. IMPRESSION: 1. Status post Bentall type aortic root graft repair with unchanged contour and caliber of both the graft and the remaining portion of the vessel. No acute findings. 2. Mild diffuse bilateral bronchial wall thickening, consistent with nonspecific infectious or  inflammatory bronchitis. 3. Coronary artery disease. 4. Hepatic steatosis. Electronically Signed   By: Jearld Lesch M.D.   On: 04/18/2023 11:32   ECHO TEE Result Date: 04/17/2023    TRANSESOPHOGEAL ECHO REPORT   Patient Name:   Luis [name] Date of Exam: 04/17/2023 Medical Rec #:  [id number]        Height:       66.0 in Accession #:    [id number]       Weight:       264.3 lb Date of Birth:  04-22-1956        BSA:          2.251 m Patient Age:    67 years         BP:           126/50 mmHg Patient Gender: M                HR:           55 bpm. Exam Location:  Inpatient Procedure: Transesophageal Echo, Color Doppler and Cardiac Doppler Indications:     Bacteremia  History:         Patient has prior history of Echocardiogram examinations, most                  recent 04/15/2023. CHF, CAD; Risk Factors:Hypertension,                  Dyslipidemia and Sleep Apnea.                  Aortic Valve: 25 mm Edwards Inspiris Resilia valve is present                  in the aortic position.  Sonographer:     Delcie Roch RDCS Referring  Phys:  [id number] Corrin Parker Diagnosing Phys: Donato Schultz MD PROCEDURE: After discussion of the risks and benefits of a TEE, an informed consent was obtained from the patient. The transesophogeal probe was passed without difficulty through the esophogus of the patient. Imaged were obtained with the patient in a left lateral decubitus position. Sedation performed by different physician. The patient was monitored while under deep sedation. Anesthestetic sedation was provided intravenously by Anesthesiology: 140mg  of Propofol, 100mg  of Lidocaine. The patient's vital signs; including heart rate, blood pressure, and oxygen saturation; remained stable throughout the procedure. The patient developed no complications during the procedure.  IMPRESSIONS  1. Left ventricular ejection fraction, by estimation, is 60 to 65%. The left ventricle has normal function. The left ventricle has no regional wall motion abnormalities.  2. Right ventricular systolic function is normal. The right ventricular size is normal.  3. No left atrial/left atrial appendage thrombus was detected.  4. The mitral valve is normal in structure. Mild mitral valve regurgitation. No evidence of mitral stenosis.  5. The aortic valve has been repaired/replaced. Aortic valve regurgitation is not visualized. No aortic stenosis is present. There is a 25 mm Edwards Inspiris Resilia valve present in the aortic position. Echo findings are consistent with normal structure and function of the aortic valve prosthesis.  6. Aortic root/ascending aorta has been repaired/replaced.  7. The inferior vena cava is normal in size with greater than 50% respiratory variability, suggesting right atrial pressure of 3 mmHg. Conclusion(s)/Recommendation(s): No evidence of vegetation/infective endocarditis on this transesophageael echocardiogram. FINDINGS  Left Ventricle: Left ventricular ejection fraction, by estimation, is 60 to 65%. The left ventricle has normal function. The  left ventricle has no regional wall motion abnormalities. The left ventricular internal cavity size was normal in size. There is  no left ventricular hypertrophy. Right Ventricle: The right ventricular size is normal. No increase in right ventricular wall thickness. Right ventricular systolic function is normal. Left Atrium: Left atrial size was normal in size. No left atrial/left atrial appendage thrombus was detected. Right Atrium: Right atrial size was normal in size. Pericardium: There is no evidence of pericardial effusion. Mitral Valve: The mitral valve is normal in structure. Mild mitral valve regurgitation. No evidence of mitral valve stenosis. Tricuspid Valve: The tricuspid valve is normal in structure. Tricuspid valve regurgitation is not demonstrated. No evidence of tricuspid stenosis. Aortic Valve: The aortic valve has been repaired/replaced. Aortic valve regurgitation is not visualized. No aortic stenosis is present. Aortic valve mean gradient measures 2.0 mmHg. Aortic valve peak gradient measures 3.2 mmHg. There is a 25 mm Edwards Inspiris Resilia valve present in the aortic position. Echo findings are consistent with normal structure and function of the aortic valve prosthesis. Pulmonic Valve: The pulmonic valve was normal in structure. Pulmonic valve regurgitation is not visualized. No evidence of pulmonic stenosis. Aorta: The aortic root/ascending aorta has been repaired/replaced. Venous: The inferior vena cava is normal in size with greater than 50% respiratory variability, suggesting right atrial pressure of 3 mmHg. IAS/Shunts: No atrial level shunt detected by color flow Doppler.  AORTIC VALVE AV Vmax:      89.40 cm/s AV Vmean:     63.400 cm/s AV VTI:       0.182 m AV Peak Grad: 3.2 mmHg AV Mean Grad: 2.0 mmHg Donato Schultz MD Electronically signed by Donato Schultz MD Signature Date/Time: 04/17/2023/1:16:47 PM    Final    [place] EKG SITE RITE Result Date: 04/17/2023 If Site Rite image not attached,  placement could not be confirmed due to current cardiac rhythm.  EP STUDY Result Date: 04/17/2023 See surgical note for result.  ECHOCARDIOGRAM COMPLETE Result Date: 04/15/2023    ECHOCARDIOGRAM REPORT   Patient Name:   Luis [name] Date of Exam: 04/15/2023 Medical Rec #:  [id number]        Height:       66.0 in Accession #:    [id number]       Weight:       269.2 lb Date of Birth:  [birth year]        BSA:          2.269 m Patient Age:    67 years         BP:           103/54 mmHg Patient Gender: M                HR:           57 bpm. Exam Location:  Inpatient Procedure: 2D Echo, Cardiac Doppler, Color Doppler and Intracardiac            Opacification Agent Indications:    Bacteremia  History:        Patient has prior history of Echocardiogram examinations, most                 recent 11/24/2022. CHF, CAD and Previous Myocardial Infarction,                 Arrythmias:Atrial Fibrillation, Signs/Symptoms:Dyspnea; Risk                 Factors:Hypertension, Dyslipidemia and Sleep Apnea.  Aortic Valve: 25 mm Edwards Inspiris Resilia valve is present in                 the aortic position.  Sonographer:    Vern Claude Referring Phys: [id number] CORNELIUS N VAN DAM IMPRESSIONS  1. Left ventricular ejection fraction, by estimation, is 65 to 70%. The left ventricle has normal function. The left ventricle has no regional wall motion abnormalities. There is mild left ventricular hypertrophy. Left ventricular diastolic parameters are indeterminate.  2. Right ventricular systolic function is normal. The right ventricular size is normal. Tricuspid regurgitation signal is inadequate for assessing PA pressure.  3. The mitral valve is normal in structure. Trivial mitral valve regurgitation. No evidence of mitral stenosis.  4. The aortic valve has been repaired/replaced. Aortic valve regurgitation is not visualized. There is a 25 mm Edwards Inspiris Resilia valve present in the aortic position. Vmax 2.2 m/s, MG  , EOA 1.9 cm^2 Conclusion(s)/Recommendation(s): No evidence of valvular vegetations on this transthoracic echocardiogram, though poor quality study Consider a transesophageal echocardiogram to exclude infective endocarditis if clinically indicated. FINDINGS  Left Ventricle: Left ventricular ejection fraction, by estimation, is 65 to 70%. The left ventricle has normal function. The left ventricle has no regional wall motion abnormalities. The left ventricular internal cavity size was normal in size. There is  mild left ventricular hypertrophy. Left ventricular diastolic parameters are indeterminate. Right Ventricle: The right ventricular size is normal. No increase in right ventricular wall thickness. Right ventricular systolic function is normal. Tricuspid regurgitation signal is inadequate for assessing PA pressure. Left Atrium: Left atrial size was normal in size. Right Atrium: Right atrial size was normal in size. Pericardium: Trivial pericardial effusion is present. Mitral Valve: The mitral valve is normal in structure. Trivial mitral valve regurgitation. No evidence of mitral valve stenosis. MV peak gradient, 6.2 mmHg. The mean mitral valve gradient is 2.0 mmHg. Tricuspid Valve: The tricuspid valve is normal in structure. Tricuspid valve regurgitation is trivial. Aortic Valve: The aortic valve has been repaired/replaced. Aortic valve regurgitation is not visualized. Aortic valve mean gradient measures 7.5 mmHg. Aortic valve peak gradient measures 17.3 mmHg. Aortic valve area, by VTI measures 2.16 cm. There is a 25 mm Edwards Inspiris Resilia valve present in the aortic position. Pulmonic Valve: The pulmonic valve was not well visualized. Pulmonic valve regurgitation is trivial. Aorta: The aortic root and ascending aorta are structurally normal, with no evidence of dilitation. IAS/Shunts: The interatrial septum was not well visualized.  LEFT VENTRICLE PLAX 2D LVIDd:         4.90 cm      Diastology LVIDs:          3.40 cm      LV e' medial:    6.42 cm/s LV PW:         1.20 cm      LV E/e' medial:  17.4 LV IVS:        1.00 cm      LV e' lateral:   17.30 cm/s LVOT diam:     1.70 cm      LV E/e' lateral: 6.5 LV SV:         81 LV SV Index:   36 LVOT Area:     2.27 cm  LV Volumes (MOD) LV vol d, MOD A2C: 143.0 ml LV vol d, MOD A4C: 214.0 ml LV vol s, MOD A2C: 31.1 ml LV vol s, MOD A4C: 48.3 ml LV SV MOD A2C:  111.9 ml LV SV MOD A4C:     214.0 ml LV SV MOD BP:      135.1 ml RIGHT VENTRICLE RV Basal diam:  3.70 cm RV Mid diam:    2.90 cm RV S prime:     14.50 cm/s TAPSE (M-mode): 2.9 cm LEFT ATRIUM             Index        RIGHT ATRIUM           Index LA diam:        3.90 cm 1.72 cm/m   RA Area:     20.50 cm LA Vol (A2C):   52.2 ml 23.01 ml/m  RA Volume:   55.20 ml  24.33 ml/m LA Vol (A4C):   50.1 ml 22.08 ml/m LA Biplane Vol: 51.2 ml 22.57 ml/m  AORTIC VALVE                     PULMONIC VALVE AV Area (Vmax):    1.96 cm      PV Vmax:       1.10 m/s AV Area (Vmean):   1.83 cm      PV Peak grad:  4.8 mmHg AV Area (VTI):     2.16 cm AV Vmax:           208.00 cm/s AV Vmean:          125.500 cm/s AV VTI:            0.374 m AV Peak Grad:      17.3 mmHg AV Mean Grad:      7.5 mmHg LVOT Vmax:         180.00 cm/s LVOT Vmean:        101.000 cm/s LVOT VTI:          0.355 m LVOT/AV VTI ratio: 0.95  AORTA Ao Root diam: 3.60 cm Ao Asc diam:  3.10 cm MITRAL VALVE MV Area (PHT): 3.12 cm     SHUNTS MV Area VTI:   1.81 cm     Systemic VTI:  0.36 m MV Peak grad:  6.2 mmHg     Systemic Diam: 1.70 cm MV Mean grad:  2.0 mmHg MV Vmax:       1.25 m/s MV Vmean:      66.1 cm/s MV Decel Time: 243 msec MV E velocity: 112.00 cm/s MV A velocity: 98.30 cm/s MV E/A ratio:  1.14 Epifanio Lesches MD Electronically signed by Epifanio Lesches MD Signature Date/Time: 04/15/2023/2:02:09 PM    Final    CT ABDOMEN PELVIS WO CONTRAST Result Date: 04/13/2023 CLINICAL DATA:  Status post cystectomy on 03/03/2023 with pain and fever, initial  encounter EXAM: CT ABDOMEN AND PELVIS WITHOUT CONTRAST TECHNIQUE: Multidetector CT imaging of the abdomen and pelvis was performed following the standard protocol without IV contrast. RADIATION DOSE REDUCTION: This exam was performed according to the departmental dose-optimization program which includes automated exposure control, adjustment of the mA and/or kV according to patient size and/or use of iterative reconstruction technique. COMPARISON:  12/21/2022 FINDINGS: Lower chest: No acute abnormality. Hepatobiliary: Fatty infiltration of the liver is noted. Gallbladder has been surgically removed. Pancreas: Unremarkable. No pancreatic ductal dilatation or surrounding inflammatory changes. Spleen: Normal in size without focal abnormality. Adrenals/Urinary Tract: Adrenal glands are within normal limits. Kidneys demonstrate no renal calculi. Small exophytic cysts are noted in left kidney stable from the prior exam. No further follow-up is recommended. Mild fullness of the collecting systems  is noted bilaterally. A ureteral stent is noted on the right. Interval cystectomy is noted with urostomy in the right lower quadrant. The ureteral stent on the right extends into the ostomy bag. Stomach/Bowel: Stomach is within normal limits. Small bowel shows no obstructive change. Scattered fluid and fecal material is noted throughout the colon. No obstructive changes are seen. Postsurgical changes in the right lower quadrant are noted consistent with creation of an ileal conduit Vascular/Lymphatic: Aortic atherosclerosis. No enlarged abdominal or pelvic lymph nodes. Reproductive: Prostate has been surgically removed during the cystectomy procedure. No fluid is noted in the operative bed. Other: No free fluid is noted.  No focal herniation is noted. Musculoskeletal: No acute or significant osseous findings. IMPRESSION: Changes consistent with history of prior cystectomy and prostatectomy with ileal conduit formation in the right  mid abdomen. Mild fullness of the collecting systems is noted bilaterally. A right-sided nephroureteral stent is noted extending into the ostomy bag. No evidence of postoperative abscess or extravasation is identified. No acute abnormality is seen. Electronically Signed   By: Alcide Clever M.D.   On: 04/13/2023 15:32   DG Chest Port 1 View Result Date: 04/13/2023 CLINICAL DATA:  Possible sepsis EXAM: PORTABLE CHEST 1 VIEW COMPARISON:  Chest x-ray October 20, 2021 FINDINGS: The cardiomediastinal silhouette is unchanged in contour status post median sternotomy. No focal pulmonary opacity. No pleural effusion or pneumothorax. The visualized upper abdomen is unremarkable. No acute osseous abnormality. IMPRESSION: No active disease. Electronically Signed   By: Jacob Moores M.D.   On: 04/13/2023 14:09    Microbiology: Results for orders placed or performed during the hospital encounter of 04/13/23  Resp panel by RT-PCR (RSV, Flu A&B, Covid) Anterior Nasal Swab     Status: None   Collection Time: 04/13/23 10:14 AM   Specimen: Anterior Nasal Swab  Result Value Ref Range Status   SARS Coronavirus 2 by RT PCR NEGATIVE NEGATIVE Final    Comment: (NOTE) SARS-CoV-2 target nucleic acids are NOT DETECTED.  The SARS-CoV-2 RNA is generally detectable in upper respiratory specimens during the acute phase of infection. The lowest concentration of SARS-CoV-2 viral copies this assay can detect is 138 copies/mL. A negative result does not preclude SARS-Cov-2 infection and should not be used as the sole basis for treatment or other patient management decisions. A negative result may occur with  improper specimen collection/handling, submission of specimen other than nasopharyngeal swab, presence of viral mutation(s) within the areas targeted by this assay, and inadequate number of viral copies(<138 copies/mL). A negative result must be combined with clinical observations, patient history, and  epidemiological information. The expected result is Negative.  Fact Sheet for Patients:  BloggerCourse.com  Fact Sheet for Healthcare Providers:  SeriousBroker.it  This test is no t yet approved or cleared by the [place] FDA and  has been authorized for detection and/or diagnosis of SARS-CoV-2 by FDA under an Emergency Use Authorization (EUA). This EUA will remain  in effect (meaning this test can be used) for the duration of the COVID-19 declaration under Section 564(b)(1) of the Act, 21 U.S.C.section 360bbb-3(b)(1), unless the authorization is terminated  or revoked sooner.       Influenza A by PCR NEGATIVE NEGATIVE Final   Influenza B by PCR NEGATIVE NEGATIVE Final    Comment: (NOTE) The Xpert Xpress SARS-CoV-2/FLU/RSV plus assay is intended as an aid in the diagnosis of influenza from Nasopharyngeal swab specimens and should not be used as a sole basis for treatment. Nasal washings  and aspirates are unacceptable for Xpert Xpress SARS-CoV-2/FLU/RSV testing.  Fact Sheet for Patients: BloggerCourse.com  Fact Sheet for Healthcare Providers: SeriousBroker.it  This test is not yet approved or cleared by the [place] FDA and has been authorized for detection and/or diagnosis of SARS-CoV-2 by FDA under an Emergency Use Authorization (EUA). This EUA will remain in effect (meaning this test can be used) for the duration of the COVID-19 declaration under Section 564(b)(1) of the Act, 21 U.S.C. section 360bbb-3(b)(1), unless the authorization is terminated or revoked.     Resp Syncytial Virus by PCR NEGATIVE NEGATIVE Final    Comment: (NOTE) Fact Sheet for Patients: BloggerCourse.com  Fact Sheet for Healthcare Providers: SeriousBroker.it  This test is not yet approved or cleared by the [place] FDA and has been  authorized for detection and/or diagnosis of SARS-CoV-2 by FDA under an Emergency Use Authorization (EUA). This EUA will remain in effect (meaning this test can be used) for the duration of the COVID-19 declaration under Section 564(b)(1) of the Act, 21 U.S.C. section 360bbb-3(b)(1), unless the authorization is terminated or revoked.  Performed at [facility], [address] [place], [place] [address]   Urine Culture     Status: Abnormal   Collection Time: 04/13/23 10:14 AM   Specimen: Urine, Random  Result Value Ref Range Status   Specimen Description   Final    URINE, RANDOM Performed at [facility], [address] [place], [place] [address]    Special Requests   Final    NONE Reflexed from [id number] Performed at [facility], [address] [place], [place] [address]    Culture (A)  Final    >=100,000 COLONIES/mL ENTEROCOCCUS FAECALIS >=100,000 COLONIES/mL STENOTROPHOMONAS MALTOPHILIA    Report Status 04/15/2023 FINAL  Final   Organism ID, Bacteria ENTEROCOCCUS FAECALIS (A)  Final   Organism ID, Bacteria STENOTROPHOMONAS MALTOPHILIA (A)  Final      Susceptibility   Enterococcus faecalis - MIC*    AMPICILLIN <=2 SENSITIVE Sensitive     NITROFURANTOIN <=16 SENSITIVE Sensitive     VANCOMYCIN 1 SENSITIVE Sensitive     * >=100,000 COLONIES/mL ENTEROCOCCUS FAECALIS   Stenotrophomonas maltophilia - MIC*    LEVOFLOXACIN 0.25 SENSITIVE Sensitive     TRIMETH/SULFA <=20 SENSITIVE Sensitive     * >=100,000 COLONIES/mL STENOTROPHOMONAS MALTOPHILIA  Blood Culture (routine x 2)     Status: Abnormal   Collection Time: 04/13/23 10:24 AM   Specimen: BLOOD  Result Value Ref Range Status   Specimen Description   Final    BLOOD RIGHT ANTECUBITAL Performed at [facility], [address], [place], [place] [address]    Special Requests   Final    BOTTLES DRAWN AEROBIC AND ANAEROBIC Blood Culture adequate  volume Performed at [facility], [address], [place], [place] [address]    Culture  Setup Time   Final    GRAM POSITIVE COCCI IN CHAINS IN BOTH AEROBIC AND ANAEROBIC BOTTLES CRITICAL RESULT CALLED TO, READ BACK BY AND VERIFIED WITH: PHARMD L. Bernarda Caffey [id number] @ 2313 FH Performed at [facility], [address], [place], [place] [address]    Culture ENTEROCOCCUS FAECALIS (A)  Final   Report Status 04/16/2023 FINAL  Final   Organism ID, Bacteria ENTEROCOCCUS FAECALIS  Final      Susceptibility   Enterococcus faecalis - MIC*    AMPICILLIN <=2 SENSITIVE Sensitive     VANCOMYCIN 1 SENSITIVE Sensitive  GENTAMICIN SYNERGY SENSITIVE Sensitive     * ENTEROCOCCUS FAECALIS  Blood Culture ID Panel (Reflexed)     Status: Abnormal   Collection Time: 04/13/23 10:24 AM  Result Value Ref Range Status   Enterococcus faecalis DETECTED (A) NOT DETECTED Final    Comment: CRITICAL RESULT CALLED TO, READ BACK BY AND VERIFIED WITH: PHARMD L. Bernarda Caffey [id number] @ 2313 FH    Enterococcus Faecium NOT DETECTED NOT DETECTED Final   Listeria monocytogenes NOT DETECTED NOT DETECTED Final   Staphylococcus species NOT DETECTED NOT DETECTED Final   Staphylococcus aureus (BCID) NOT DETECTED NOT DETECTED Final   Staphylococcus epidermidis NOT DETECTED NOT DETECTED Final   Staphylococcus lugdunensis NOT DETECTED NOT DETECTED Final   Streptococcus species NOT DETECTED NOT DETECTED Final   Streptococcus agalactiae NOT DETECTED NOT DETECTED Final   Streptococcus pneumoniae NOT DETECTED NOT DETECTED Final   Streptococcus pyogenes NOT DETECTED NOT DETECTED Final   A.calcoaceticus-baumannii NOT DETECTED NOT DETECTED Final   Bacteroides fragilis NOT DETECTED NOT DETECTED Final   Enterobacterales NOT DETECTED NOT DETECTED Final   Enterobacter cloacae complex NOT DETECTED NOT DETECTED Final   Escherichia coli NOT DETECTED NOT DETECTED Final   Klebsiella aerogenes NOT DETECTED NOT  DETECTED Final   Klebsiella oxytoca NOT DETECTED NOT DETECTED Final   Klebsiella pneumoniae NOT DETECTED NOT DETECTED Final   Proteus species NOT DETECTED NOT DETECTED Final   Salmonella species NOT DETECTED NOT DETECTED Final   Serratia marcescens NOT DETECTED NOT DETECTED Final   Haemophilus influenzae NOT DETECTED NOT DETECTED Final   Neisseria meningitidis NOT DETECTED NOT DETECTED Final   Pseudomonas aeruginosa NOT DETECTED NOT DETECTED Final   Stenotrophomonas maltophilia NOT DETECTED NOT DETECTED Final   Candida albicans NOT DETECTED NOT DETECTED Final   Candida auris NOT DETECTED NOT DETECTED Final   Candida glabrata NOT DETECTED NOT DETECTED Final   Candida krusei NOT DETECTED NOT DETECTED Final   Candida parapsilosis NOT DETECTED NOT DETECTED Final   Candida tropicalis NOT DETECTED NOT DETECTED Final   Cryptococcus neoformans/gattii NOT DETECTED NOT DETECTED Final   Vancomycin resistance NOT DETECTED NOT DETECTED Final    Comment: Performed at [facility], [address], [place], [place] [address]  Blood Culture (routine x 2)     Status: Abnormal   Collection Time: 04/13/23 10:27 AM   Specimen: BLOOD LEFT HAND  Result Value Ref Range Status   Specimen Description   Final    BLOOD LEFT HAND Performed at [facility], [address] [place], [place] [address]    Special Requests   Final    BOTTLES DRAWN AEROBIC AND ANAEROBIC Blood Culture adequate volume Performed at [facility], [address], [place], [place] [address]    Culture  Setup Time   Final    GRAM POSITIVE COCCI IN CHAINS IN BOTH AEROBIC AND ANAEROBIC BOTTLES    Culture (A)  Final    ENTEROCOCCUS FAECALIS SUSCEPTIBILITIES PERFORMED ON PREVIOUS CULTURE WITHIN THE LAST 5 DAYS. Performed at [facility], [address], [place], [place] [address]    Report Status 04/16/2023 FINAL  Final  MRSA Next Gen by PCR, Nasal     Status: None   Collection Time: 04/13/23  5:27  PM   Specimen: Nasal Mucosa; Nasal Swab  Result Value Ref Range Status   MRSA by PCR Next Gen NOT DETECTED NOT DETECTED Final    Comment: (NOTE) The GeneXpert MRSA Assay (FDA approved for NASAL  specimens only), is one component of a comprehensive MRSA colonization surveillance program. It is not intended to diagnose MRSA infection nor to guide or monitor treatment for MRSA infections. Test performance is not FDA approved in patients less than 5 years old. Performed at [facility], [address], [place], [place] [address]   Culture, blood (Routine X 2) w Reflex to ID Panel     Status: None (Preliminary result)   Collection Time: 04/15/23  8:16 AM   Specimen: BLOOD RIGHT ARM  Result Value Ref Range Status   Specimen Description   Final    BLOOD RIGHT ARM Performed at [facility], [address], [place], [place] [address]    Special Requests   Final    BOTTLES DRAWN AEROBIC AND ANAEROBIC Blood Culture results may not be optimal due to an inadequate volume of blood received in culture bottles Performed at [facility], [address] [place], [place] [address]    Culture   Final    NO GROWTH 3 DAYS Performed at [facility], [address] [place], [place] [address]    Report Status PENDING  Incomplete  Culture, blood (Routine X 2) w Reflex to ID Panel     Status: None (Preliminary result)   Collection Time: 04/15/23  8:29 AM   Specimen: BLOOD RIGHT HAND  Result Value Ref Range Status   Specimen Description   Final    BLOOD RIGHT HAND Performed at [facility], [address], [place], [place] [address]    Special Requests   Final    BOTTLES DRAWN AEROBIC AND ANAEROBIC Blood Culture results may not be optimal due to an inadequate volume of blood received in culture bottles Performed at [facility], [address], [place], [place] [address]    Culture   Final    NO GROWTH 3 DAYS Performed at  [facility], [address], [place], [place] [address]    Report Status PENDING  Incomplete    Labs: CBC: Recent Labs  Lab 04/13/23 1024 04/14/23 0250 04/14/23 0911 04/14/23 1901 04/15/23 0928 04/16/23 0915 04/17/23 0456 04/18/23 0543  WBC 19.6* 8.6  --   --  5.3 5.2 5.4 6.0  NEUTROABS 17.7*  --   --   --  3.3  --   --   --   HGB 9.8* 7.2*   < > 8.4* 7.9* 8.4* 8.6* 7.8*  HCT 31.8* 23.7*   < > 27.5* 25.8* 27.2* 29.0* 24.9*  MCV 85.9 89.1  --   --  88.4 87.7 90.1 88.0  PLT 260 163  --   --  146* 156 167 154   < > = values in this interval not displayed.   Basic Metabolic Panel: Recent Labs  Lab 04/14/23 0250 04/15/23 0928 04/16/23 0915 04/17/23 0456 04/18/23 0543  NA 134* 137 137 142 142  K 3.4* 3.4* 3.2* 3.5 3.1*  CL 104 106 105 110 106  CO2 18* 23 23 23 26   GLUCOSE 109* 129* 126* 102* 107*  BUN 29* 21 14 11 11   CREATININE 2.01* 1.44* 1.28* 1.13 0.95  CALCIUM 7.7* 8.0* 8.1* 8.2* 8.2*  MG  --  1.8  --   --   --   PHOS  --  2.5  --   --   --    Liver Function Tests: Recent Labs  Lab 04/13/23 1024  AST 36  ALT 32  ALKPHOS 136*  BILITOT 1.0  PROT 8.9*  ALBUMIN 3.2*   CBG: Recent Labs  Lab 04/17/23 1936 04/17/23 2321 04/18/23 0330 04/18/23 0716 04/18/23 1107  GLUCAP 97 99 96 97 111*    Discharge time spent: greater than 30 minutes.  Signed: Thad Ranger, MD [facility] 04/18/2023

## 2023-04-18 NOTE — Progress Notes (Signed)
Subjective: No new complaints  Antibiotics:  Anti-infectives (From admission, onward)    Start     Dose/Rate Route Frequency Ordered Stop   04/17/23 0000  ampicillin IVPB        12 g Intravenous Every 24 hours 04/17/23 1447 05/12/23 2359   04/16/23 1200  ampicillin (OMNIPEN) 2 g in sodium chloride 0.9 % 100 mL IVPB        2 g 300 mL/hr over 20 Minutes Intravenous Every 4 hours 04/16/23 1121     04/14/23 1030  cefTRIAXone (ROCEPHIN) 2 g in sodium chloride 0.9 % 100 mL IVPB  Status:  Discontinued        2 g 200 mL/hr over 30 Minutes Intravenous Every 12 hours 04/14/23 0938 04/17/23 1043   04/14/23 0030  ampicillin (OMNIPEN) 2 g in sodium chloride 0.9 % 100 mL IVPB  Status:  Discontinued        2 g 300 mL/hr over 20 Minutes Intravenous Every 6 hours 04/13/23 2332 04/16/23 1121   04/13/23 2300  ceFEPIme (MAXIPIME) 2 g in sodium chloride 0.9 % 100 mL IVPB  Status:  Discontinued        2 g 200 mL/hr over 30 Minutes Intravenous Every 12 hours 04/13/23 1727 04/13/23 2331   04/13/23 1100  vancomycin (VANCOREADY) IVPB 2000 mg/400 mL        2,000 mg 200 mL/hr over 120 Minutes Intravenous  Once 04/13/23 1025 04/13/23 1354   04/13/23 1015  ceFEPIme (MAXIPIME) 2 g in sodium chloride 0.9 % 100 mL IVPB        2 g 200 mL/hr over 30 Minutes Intravenous  Once 04/13/23 1014 04/13/23 1140   04/13/23 1015  metroNIDAZOLE (FLAGYL) IVPB 500 mg        500 mg 100 mL/hr over 60 Minutes Intravenous  Once 04/13/23 1014 04/13/23 1215   04/13/23 1015  vancomycin (VANCOCIN) IVPB 1000 mg/200 mL premix  Status:  Discontinued        1,000 mg 200 mL/hr over 60 Minutes Intravenous  Once 04/13/23 1014 04/13/23 1025       Medications: Scheduled Meds:  aspirin EC  81 mg Oral Daily   Chlorhexidine Gluconate Cloth  6 each Topical Daily   feeding supplement  1 Container Oral TID BM   rosuvastatin  10 mg Oral Daily   sodium chloride flush  10-40 mL Intracatheter Q12H   sodium chloride flush  10-40 mL  Intracatheter Q12H   venlafaxine XR  150 mg Oral Q breakfast   Continuous Infusions:  ampicillin (OMNIPEN) IV 2 g (04/18/23 1141)   PRN Meds:.acetaminophen, docusate sodium, ondansetron (ZOFRAN) IV, pneumococcal 20-valent conjugate vaccine, polyethylene glycol, sodium chloride flush, sodium chloride flush    Objective: Weight change: 1.7 kg  Intake/Output Summary (Last 24 hours) at 04/18/2023 1142 Last data filed at 04/18/2023 0700 Gross per 24 hour  Intake 676.84 ml  Output 3800 ml  Net -3123.16 ml   Blood pressure (!) 143/60, pulse (!) 48, temperature 97.8 F (36.6 C), temperature source Oral, resp. rate 17, height 5\' 6"  (1.676 m), weight 121.6 kg, SpO2 98%. Temp:  [97.8 F (36.6 C)-98.2 F (36.8 C)] 97.8 F (36.6 C) (12/24 0730) Pulse Rate:  [48-65] 48 (12/24 0600) Resp:  [10-25] 17 (12/24 0600) BP: (116-164)/(51-83) 143/60 (12/24 0600) SpO2:  [81 %-100 %] 98 % (12/24 0600) Weight:  [121.6 kg] 121.6 kg (12/24 0500)  Physical Exam: Physical Exam Constitutional:      Appearance: He  is well-developed.  HENT:     Head: Normocephalic and atraumatic.  Eyes:     Conjunctiva/sclera: Conjunctivae normal.  Cardiovascular:     Rate and Rhythm: Normal rate and regular rhythm.  Pulmonary:     Effort: Pulmonary effort is normal. No respiratory distress.     Breath sounds: No wheezing.  Abdominal:     General: There is no distension.     Palpations: Abdomen is soft.  Musculoskeletal:        General: Normal range of motion.     Cervical back: Normal range of motion and neck supple.  Skin:    General: Skin is warm and dry.     Findings: No erythema or rash.  Neurological:     General: No focal deficit present.     Mental Status: He is alert and oriented to person, place, and time.  Psychiatric:        Mood and Affect: Mood normal.        Behavior: Behavior normal.        Thought Content: Thought content normal.        Judgment: Judgment normal.     Ileal conduit is  clean  CBC:    BMET Recent Labs    04/17/23 0456 04/18/23 0543  NA 142 142  K 3.5 3.1*  CL 110 106  CO2 23 26  GLUCOSE 102* 107*  BUN 11 11  CREATININE 1.13 0.95  CALCIUM 8.2* 8.2*     Liver Panel  No results for input(s): "PROT", "ALBUMIN", "AST", "ALT", "ALKPHOS", "BILITOT", "BILIDIR", "IBILI" in the last 72 hours.      Sedimentation Rate No results for input(s): "ESRSEDRATE" in the last 72 hours. C-Reactive Protein No results for input(s): "CRP" in the last 72 hours.  Micro Results: Recent Results (from the past 720 hours)  Resp panel by RT-PCR (RSV, Flu A&B, Covid) Anterior Nasal Swab     Status: None   Collection Time: 04/13/23 10:14 AM   Specimen: Anterior Nasal Swab  Result Value Ref Range Status   SARS Coronavirus 2 by RT PCR NEGATIVE NEGATIVE Final    Comment: (NOTE) SARS-CoV-2 target nucleic acids are NOT DETECTED.  The SARS-CoV-2 RNA is generally detectable in upper respiratory specimens during the acute phase of infection. The lowest concentration of SARS-CoV-2 viral copies this assay can detect is 138 copies/mL. A negative result does not preclude SARS-Cov-2 infection and should not be used as the sole basis for treatment or other patient management decisions. A negative result may occur with  improper specimen collection/handling, submission of specimen other than nasopharyngeal swab, presence of viral mutation(s) within the areas targeted by this assay, and inadequate number of viral copies(<138 copies/mL). A negative result must be combined with clinical observations, patient history, and epidemiological information. The expected result is Negative.  Fact Sheet for Patients:  BloggerCourse.com  Fact Sheet for Healthcare Providers:  SeriousBroker.it  This test is no t yet approved or cleared by the [place] FDA and  has been authorized for detection and/or diagnosis of SARS-CoV-2  by FDA under an Emergency Use Authorization (EUA). This EUA will remain  in effect (meaning this test can be used) for the duration of the COVID-19 declaration under Section 564(b)(1) of the Act, 21 U.S.C.section 360bbb-3(b)(1), unless the authorization is terminated  or revoked sooner.       Influenza A by PCR NEGATIVE NEGATIVE Final   Influenza B by PCR NEGATIVE NEGATIVE Final    Comment: (NOTE) The  Xpert Xpress SARS-CoV-2/FLU/RSV plus assay is intended as an aid in the diagnosis of influenza from Nasopharyngeal swab specimens and should not be used as a sole basis for treatment. Nasal washings and aspirates are unacceptable for Xpert Xpress SARS-CoV-2/FLU/RSV testing.  Fact Sheet for Patients: BloggerCourse.com  Fact Sheet for Healthcare Providers: SeriousBroker.it  This test is not yet approved or cleared by the [place] FDA and has been authorized for detection and/or diagnosis of SARS-CoV-2 by FDA under an Emergency Use Authorization (EUA). This EUA will remain in effect (meaning this test can be used) for the duration of the COVID-19 declaration under Section 564(b)(1) of the Act, 21 U.S.C. section 360bbb-3(b)(1), unless the authorization is terminated or revoked.     Resp Syncytial Virus by PCR NEGATIVE NEGATIVE Final    Comment: (NOTE) Fact Sheet for Patients: BloggerCourse.com  Fact Sheet for Healthcare Providers: SeriousBroker.it  This test is not yet approved or cleared by the [place] FDA and has been authorized for detection and/or diagnosis of SARS-CoV-2 by FDA under an Emergency Use Authorization (EUA). This EUA will remain in effect (meaning this test can be used) for the duration of the COVID-19 declaration under Section 564(b)(1) of the Act, 21 U.S.C. section 360bbb-3(b)(1), unless the authorization is terminated or revoked.  Performed at  [facility], [address] [place], [place] [address]   Urine Culture     Status: Abnormal   Collection Time: 04/13/23 10:14 AM   Specimen: Urine, Random  Result Value Ref Range Status   Specimen Description   Final    URINE, RANDOM Performed at [facility], [address] [place], [place] [address]    Special Requests   Final    NONE Reflexed from [id number] Performed at [facility], [address], [place], [place] [address]    Culture (A)  Final    >=100,000 COLONIES/mL ENTEROCOCCUS FAECALIS >=100,000 COLONIES/mL STENOTROPHOMONAS MALTOPHILIA    Report Status 04/15/2023 FINAL  Final   Organism ID, Bacteria ENTEROCOCCUS FAECALIS (A)  Final   Organism ID, Bacteria STENOTROPHOMONAS MALTOPHILIA (A)  Final      Susceptibility   Enterococcus faecalis - MIC*    AMPICILLIN <=2 SENSITIVE Sensitive     NITROFURANTOIN <=16 SENSITIVE Sensitive     VANCOMYCIN 1 SENSITIVE Sensitive     * >=100,000 COLONIES/mL ENTEROCOCCUS FAECALIS   Stenotrophomonas maltophilia - MIC*    LEVOFLOXACIN 0.25 SENSITIVE Sensitive     TRIMETH/SULFA <=20 SENSITIVE Sensitive     * >=100,000 COLONIES/mL STENOTROPHOMONAS MALTOPHILIA  Blood Culture (routine x 2)     Status: Abnormal   Collection Time: 04/13/23 10:24 AM   Specimen: BLOOD  Result Value Ref Range Status   Specimen Description   Final    BLOOD RIGHT ANTECUBITAL Performed at [facility], [address] [place], [place] [address]    Special Requests   Final    BOTTLES DRAWN AEROBIC AND ANAEROBIC Blood Culture adequate volume Performed at [facility], [address] [place], [place] [address]    Culture  Setup Time   Final    GRAM POSITIVE COCCI IN CHAINS IN BOTH AEROBIC AND ANAEROBIC BOTTLES CRITICAL RESULT CALLED TO, READ BACK BY AND VERIFIED WITH: PHARMD L. Bernarda Caffey [id number] @ 2313 FH Performed at [facility], [address]  [place], [place] [address]    Culture ENTEROCOCCUS FAECALIS (A)  Final   Report Status 04/16/2023 FINAL  Final   Organism ID,  Bacteria ENTEROCOCCUS FAECALIS  Final      Susceptibility   Enterococcus faecalis - MIC*    AMPICILLIN <=2 SENSITIVE Sensitive     VANCOMYCIN 1 SENSITIVE Sensitive     GENTAMICIN SYNERGY SENSITIVE Sensitive     * ENTEROCOCCUS FAECALIS  Blood Culture ID Panel (Reflexed)     Status: Abnormal   Collection Time: 04/13/23 10:24 AM  Result Value Ref Range Status   Enterococcus faecalis DETECTED (A) NOT DETECTED Final    Comment: CRITICAL RESULT CALLED TO, READ BACK BY AND VERIFIED WITH: PHARMD L. Bernarda Caffey [id number] @ 2313 FH    Enterococcus Faecium NOT DETECTED NOT DETECTED Final   Listeria monocytogenes NOT DETECTED NOT DETECTED Final   Staphylococcus species NOT DETECTED NOT DETECTED Final   Staphylococcus aureus (BCID) NOT DETECTED NOT DETECTED Final   Staphylococcus epidermidis NOT DETECTED NOT DETECTED Final   Staphylococcus lugdunensis NOT DETECTED NOT DETECTED Final   Streptococcus species NOT DETECTED NOT DETECTED Final   Streptococcus agalactiae NOT DETECTED NOT DETECTED Final   Streptococcus pneumoniae NOT DETECTED NOT DETECTED Final   Streptococcus pyogenes NOT DETECTED NOT DETECTED Final   A.calcoaceticus-baumannii NOT DETECTED NOT DETECTED Final   Bacteroides fragilis NOT DETECTED NOT DETECTED Final   Enterobacterales NOT DETECTED NOT DETECTED Final   Enterobacter cloacae complex NOT DETECTED NOT DETECTED Final   Escherichia coli NOT DETECTED NOT DETECTED Final   Klebsiella aerogenes NOT DETECTED NOT DETECTED Final   Klebsiella oxytoca NOT DETECTED NOT DETECTED Final   Klebsiella pneumoniae NOT DETECTED NOT DETECTED Final   Proteus species NOT DETECTED NOT DETECTED Final   Salmonella species NOT DETECTED NOT DETECTED Final   Serratia marcescens NOT DETECTED NOT DETECTED Final   Haemophilus influenzae NOT DETECTED NOT DETECTED Final   Neisseria  meningitidis NOT DETECTED NOT DETECTED Final   Pseudomonas aeruginosa NOT DETECTED NOT DETECTED Final   Stenotrophomonas maltophilia NOT DETECTED NOT DETECTED Final   Candida albicans NOT DETECTED NOT DETECTED Final   Candida auris NOT DETECTED NOT DETECTED Final   Candida glabrata NOT DETECTED NOT DETECTED Final   Candida krusei NOT DETECTED NOT DETECTED Final   Candida parapsilosis NOT DETECTED NOT DETECTED Final   Candida tropicalis NOT DETECTED NOT DETECTED Final   Cryptococcus neoformans/gattii NOT DETECTED NOT DETECTED Final   Vancomycin resistance NOT DETECTED NOT DETECTED Final    Comment: Performed at [facility], [address], [place], [place] [address]  Blood Culture (routine x 2)     Status: Abnormal   Collection Time: 04/13/23 10:27 AM   Specimen: BLOOD LEFT HAND  Result Value Ref Range Status   Specimen Description   Final    BLOOD LEFT HAND Performed at [facility], [address], [place], [place] [address]    Special Requests   Final    BOTTLES DRAWN AEROBIC AND ANAEROBIC Blood Culture adequate volume Performed at [facility], [address] [place], [place] [address]    Culture  Setup Time   Final    GRAM POSITIVE COCCI IN CHAINS IN BOTH AEROBIC AND ANAEROBIC BOTTLES    Culture (A)  Final    ENTEROCOCCUS FAECALIS SUSCEPTIBILITIES PERFORMED ON PREVIOUS CULTURE WITHIN THE LAST 5 DAYS. Performed at [facility], [address], [place], [place] [address]    Report Status 04/16/2023 FINAL  Final  MRSA Next Gen by PCR, Nasal     Status: None   Collection Time: 04/13/23  5:27 PM   Specimen:  Nasal Mucosa; Nasal Swab  Result Value Ref Range Status   MRSA by PCR Next Gen NOT DETECTED NOT DETECTED Final    Comment: (NOTE) The GeneXpert MRSA Assay (FDA approved for NASAL specimens only), is one component of a comprehensive MRSA colonization surveillance program. It is not intended to diagnose MRSA infection nor to guide or  monitor treatment for MRSA infections. Test performance is not FDA approved in patients less than 3 years old. Performed at [facility], [address], [place], [place] [address]   Culture, blood (Routine X 2) w Reflex to ID Panel     Status: None (Preliminary result)   Collection Time: 04/15/23  8:16 AM   Specimen: BLOOD RIGHT ARM  Result Value Ref Range Status   Specimen Description   Final    BLOOD RIGHT ARM Performed at [facility], [address], [place], [place] [address]    Special Requests   Final    BOTTLES DRAWN AEROBIC AND ANAEROBIC Blood Culture results may not be optimal due to an inadequate volume of blood received in culture bottles Performed at [facility], [address] [place], [place] [address]    Culture   Final    NO GROWTH 3 DAYS Performed at [facility], [address] [place], [place] [address]    Report Status PENDING  Incomplete  Culture, blood (Routine X 2) w Reflex to ID Panel     Status: None (Preliminary result)   Collection Time: 04/15/23  8:29 AM   Specimen: BLOOD RIGHT HAND  Result Value Ref Range Status   Specimen Description   Final    BLOOD RIGHT HAND Performed at [facility], [address], [place], [place] [address]    Special Requests   Final    BOTTLES DRAWN AEROBIC AND ANAEROBIC Blood Culture results may not be optimal due to an inadequate volume of blood received in culture bottles Performed at [facility], [address], [place], [place] [address]    Culture   Final    NO GROWTH 3 DAYS Performed at [facility], [address], [place], [place] [address]    Report Status PENDING  Incomplete    Studies/Results: CT ANGIO CHEST AORTA W/CM & OR WO/CM Result Date: 04/18/2023 CLINICAL DATA:  Graft versus host disease EXAM: CT ANGIOGRAPHY CHEST WITH CONTRAST TECHNIQUE: Multidetector CT imaging of the chest was performed using the standard  protocol during bolus administration of intravenous contrast. Multiplanar CT image reconstructions and MIPs were obtained to evaluate the vascular anatomy. RADIATION DOSE REDUCTION: This exam was performed according to the departmental dose-optimization program which includes automated exposure control, adjustment of the mA and/or kV according to patient size and/or use of iterative reconstruction technique. CONTRAST:  OMNIPAQUE IOHEXOL 350 MG/ML SOLN COMPARISON:  12/14/2022 FINDINGS: Cardiovascular: Preferential opacification of the thoracic aorta. Status post Bentall type aortic root graft repair with unchanged contour and caliber of both the graft and of the remaining portion of the vessel. No acute findings. Mild atherosclerosis. Normal heart size. Three-vessel coronary artery calcifications. No pericardial effusion. Right upper extremity PICC. Mediastinum/Nodes: No enlarged mediastinal, hilar, or axillary lymph nodes. Thyroid gland, trachea, and esophagus demonstrate no significant findings. Lungs/Pleura: Mild diffuse bilateral bronchial wall thickening. No pleural effusion or pneumothorax. Upper Abdomen: No acute abnormality. Hepatic steatosis. Status post cholecystectomy. Musculoskeletal: Status post median sternotomy. No acute osseous findings. Review of the MIP images confirms the  above findings. IMPRESSION: 1. Status post Bentall type aortic root graft repair with unchanged contour and caliber of both the graft and the remaining portion of the vessel. No acute findings. 2. Mild diffuse bilateral bronchial wall thickening, consistent with nonspecific infectious or inflammatory bronchitis. 3. Coronary artery disease. 4. Hepatic steatosis. Electronically Signed   By: Jearld Lesch M.D.   On: 04/18/2023 11:32   ECHO TEE Result Date: 04/17/2023    TRANSESOPHOGEAL ECHO REPORT   Patient Name:   ADNAAN MOWAD Date of Exam: 04/17/2023 Medical Rec #:  [id number]        Height:       66.0 in Accession #:     [id number]       Weight:       264.3 lb Date of Birth:  08-Jan-1956        BSA:          2.251 m Patient Age:    67 years         BP:           126/50 mmHg Patient Gender: M                HR:           55 bpm. Exam Location:  Inpatient Procedure: Transesophageal Echo, Color Doppler and Cardiac Doppler Indications:     Bacteremia  History:         Patient has prior history of Echocardiogram examinations, most                  recent 04/15/2023. CHF, CAD; Risk Factors:Hypertension,                  Dyslipidemia and Sleep Apnea.                  Aortic Valve: 25 mm [place] Inspiris Resilia valve is present                  in the aortic position.  Sonographer:     Delcie Roch RDCS Referring Phys:  [id number] Corrin Parker Diagnosing Phys: Donato Schultz MD PROCEDURE: After discussion of the risks and benefits of a TEE, an informed consent was obtained from the patient. The transesophogeal probe was passed without difficulty through the esophogus of the patient. Imaged were obtained with the patient in a left lateral decubitus position. Sedation performed by different physician. The patient was monitored while under deep sedation. Anesthestetic sedation was provided intravenously by Anesthesiology: 140mg  of Propofol, 100mg  of Lidocaine. The patient's vital signs; including heart rate, blood pressure, and oxygen saturation; remained stable throughout the procedure. The patient developed no complications during the procedure.  IMPRESSIONS  1. Left ventricular ejection fraction, by estimation, is 60 to 65%. The left ventricle has normal function. The left ventricle has no regional wall motion abnormalities.  2. Right ventricular systolic function is normal. The right ventricular size is normal.  3. No left atrial/left atrial appendage thrombus was detected.  4. The mitral valve is normal in structure. Mild mitral valve regurgitation. No evidence of mitral stenosis.  5. The aortic valve has been repaired/replaced.  Aortic valve regurgitation is not visualized. No aortic stenosis is present. There is a 25 mm [place] Inspiris Resilia valve present in the aortic position. Echo findings are consistent with normal structure and function of the aortic valve prosthesis.  6. Aortic root/ascending aorta has been repaired/replaced.  7. The inferior vena cava is normal in size with  greater than 50% respiratory variability, suggesting right atrial pressure of 3 mmHg. Conclusion(s)/Recommendation(s): No evidence of vegetation/infective endocarditis on this transesophageael echocardiogram. FINDINGS  Left Ventricle: Left ventricular ejection fraction, by estimation, is 60 to 65%. The left ventricle has normal function. The left ventricle has no regional wall motion abnormalities. The left ventricular internal cavity size was normal in size. There is  no left ventricular hypertrophy. Right Ventricle: The right ventricular size is normal. No increase in right ventricular wall thickness. Right ventricular systolic function is normal. Left Atrium: Left atrial size was normal in size. No left atrial/left atrial appendage thrombus was detected. Right Atrium: Right atrial size was normal in size. Pericardium: There is no evidence of pericardial effusion. Mitral Valve: The mitral valve is normal in structure. Mild mitral valve regurgitation. No evidence of mitral valve stenosis. Tricuspid Valve: The tricuspid valve is normal in structure. Tricuspid valve regurgitation is not demonstrated. No evidence of tricuspid stenosis. Aortic Valve: The aortic valve has been repaired/replaced. Aortic valve regurgitation is not visualized. No aortic stenosis is present. Aortic valve mean gradient measures 2.0 mmHg. Aortic valve peak gradient measures 3.2 mmHg. There is a 25 mm [place] Inspiris Resilia valve present in the aortic position. Echo findings are consistent with normal structure and function of the aortic valve prosthesis. Pulmonic Valve: The pulmonic  valve was normal in structure. Pulmonic valve regurgitation is not visualized. No evidence of pulmonic stenosis. Aorta: The aortic root/ascending aorta has been repaired/replaced. Venous: The inferior vena cava is normal in size with greater than 50% respiratory variability, suggesting right atrial pressure of 3 mmHg. IAS/Shunts: No atrial level shunt detected by color flow Doppler.  AORTIC VALVE AV Vmax:      89.40 cm/s AV Vmean:     63.400 cm/s AV VTI:       0.182 m AV Peak Grad: 3.2 mmHg AV Mean Grad: 2.0 mmHg Donato Schultz MD Electronically signed by Donato Schultz MD Signature Date/Time: 04/17/2023/1:16:47 PM    Final    [place] EKG SITE RITE Result Date: 04/17/2023 If Site Rite image not attached, placement could not be confirmed due to current cardiac rhythm.  EP STUDY Result Date: 04/17/2023 See surgical note for result.     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 male with  hx of AVR, aortic graft placement , bladder cancer sp TURBT chemotherapy, recent ileal conduit, with plan for further chemotherapy but now admission with septic shock to the ICU due to E faecalis bacteremia   #1 E faecalis Bacteremia, rule out Native/Prosthetic valve endocarditis, aortic graft infection  TEE clean and dropping ceftriaxone at present  CT angiogram was negative for aortic graft infection  We will plan with OP AT per yesterday's order.  Would recommend giving him some IV fluid post CT scan to try to protect against contrast nephropathy and encourage increased intake of fluids over the next few days and avoidance of nephrotoxic  agents  Diagnosis: E faecalis bacteremia  Culture Result: E faecalis  No Known Allergies  OPAT Orders Discharge antibiotics to be given via PICC line Discharge antibiotics:  Ampicillin 12 gm every 24 hours as  a continuous infusion    Duration: 4 weeks End Date: 05/12/2023  [facility] Care Per Protocol:  Home health RN for IV administration and teaching; PICC line care and labs.    Labs weekly while on IV antibiotics: _x_ CBC with differential _x_ BMP __ CMP __ CRP __ ESR __ Vancomycin trough __ CK  _x_ Please pull PIC at completion of IV antibiotics __ Please leave PIC in place until doctor has seen patient or been notified  Fax weekly labs to [phone]  Clinic Follow Up Appt:    Luis [name] has an appointment on  05/03/2022 at [facility] with Dr. Daiva Eves at  [place] [facility], which  is located in the [facility] at  [address] in [place].  [address], which is located to the left of the elevators.  Phone: [phone]  Fax: [phone]  [email or web address]  The patient should arrive 30 minutes prior to their appoitment.  I have personally spent 50 minutes involved in face-to-face and non-face-to-face activities for this patient on the day of the visit. Professional time spent includes the following activities: Preparing to see the patient (review of tests), Obtaining and/or reviewing separately obtained history (admission/discharge record), Performing a medically appropriate examination and/or evaluation , Ordering medications/tests/procedures, referring and communicating with other health care professionals, Documenting clinical information in the EMR, Independently interpreting results (not separately reported), Communicating results to the patient/family/caregiver, Counseling and educating the patient/family/caregiver and Care coordination (not separately reported).     LOS: 5 days    Acey Lav 04/18/2023, 11:42 AM

## 2023-04-18 NOTE — Progress Notes (Signed)
1 Day Post-Op   Subjective/Chief Complaint:  1- Bladder Cancer - s/p cystectomy / conduit diversion 02/2023 for oligometastatic bladder cancer.   2 - Gram Negative Bacteremia - E. Facecalis bacterma 12/19 on eval severe malaise. ABX per ID narrowed to ampicillin. TEE and CTA without valve or graft vegetations.   Today "[name]" is continuing to improve clinically. Afebrile, much less malaise. Fortunately no vegetations on cardiac valve or aortic graft. Plan DC today with home IV ABX via PICC which he has and functioning well.   Objective: Vital signs in last 24 hours: Temp:  [97.8 F (36.6 C)-98.2 F (36.8 C)] 97.8 F (36.6 C) (12/24 0730) Pulse Rate:  [48-65] 48 (12/24 0600) Resp:  [10-25] 17 (12/24 0600) BP: (116-164)/(51-83) 143/60 (12/24 0600) SpO2:  [81 %-100 %] 98 % (12/24 0600) Weight:  [121.6 kg] 121.6 kg (12/24 0500) Last BM Date : 04/16/23  Intake/Output from previous day: 12/23 0701 - 12/24 0700 In: 676.8 [IV Piggyback:676.8] Out: 4950 [Urine:4950] Intake/Output this shift: Total I/O In: 112.5 [IV Piggyback:112.5] Out: -   NAD, wife at bedside. MUCH improved energy. Non-labored breathing on RA RRR Stable truncal obesity. RLQ urostomy pink/patent with Lt (blue) bander stent and non-foul urine No c/c/e. RUE PICC w/o erythema or swelling.   Lab Results:  Recent Labs    04/17/23 0456 04/18/23 0543  WBC 5.4 6.0  HGB 8.6* 7.8*  HCT 29.0* 24.9*  PLT 167 154   BMET Recent Labs    04/17/23 0456 04/18/23 0543  NA 142 142  K 3.5 3.1*  CL 110 106  CO2 23 26  GLUCOSE 102* 107*  BUN 11 11  CREATININE 1.13 0.95  CALCIUM 8.2* 8.2*   PT/INR No results for input(s): "LABPROT", "INR" in the last 72 hours. ABG No results for input(s): "PHART", "HCO3" in the last 72 hours.  Invalid input(s): "PCO2", "PO2"  Studies/Results: CT ANGIO CHEST AORTA W/CM & OR WO/CM Result Date: 04/18/2023 CLINICAL DATA:  Graft versus host disease EXAM: CT ANGIOGRAPHY CHEST WITH  CONTRAST TECHNIQUE: Multidetector CT imaging of the chest was performed using the standard protocol during bolus administration of intravenous contrast. Multiplanar CT image reconstructions and MIPs were obtained to evaluate the vascular anatomy. RADIATION DOSE REDUCTION: This exam was performed according to the departmental dose-optimization program which includes automated exposure control, adjustment of the mA and/or kV according to patient size and/or use of iterative reconstruction technique. CONTRAST:  OMNIPAQUE IOHEXOL 350 MG/ML SOLN COMPARISON:  12/14/2022 FINDINGS: Cardiovascular: Preferential opacification of the thoracic aorta. Status post Bentall type aortic root graft repair with unchanged contour and caliber of both the graft and of the remaining portion of the vessel. No acute findings. Mild atherosclerosis. Normal heart size. Three-vessel coronary artery calcifications. No pericardial effusion. Right upper extremity PICC. Mediastinum/Nodes: No enlarged mediastinal, hilar, or axillary lymph nodes. Thyroid gland, trachea, and esophagus demonstrate no significant findings. Lungs/Pleura: Mild diffuse bilateral bronchial wall thickening. No pleural effusion or pneumothorax. Upper Abdomen: No acute abnormality. Hepatic steatosis. Status post cholecystectomy. Musculoskeletal: Status post median sternotomy. No acute osseous findings. Review of the MIP images confirms the above findings. IMPRESSION: 1. Status post Bentall type aortic root graft repair with unchanged contour and caliber of both the graft and the remaining portion of the vessel. No acute findings. 2. Mild diffuse bilateral bronchial wall thickening, consistent with nonspecific infectious or inflammatory bronchitis. 3. Coronary artery disease. 4. Hepatic steatosis. Electronically Signed   By: Jearld Lesch M.D.   On: 04/18/2023 11:32  ECHO TEE Result Date: 04/17/2023    TRANSESOPHOGEAL ECHO REPORT   Patient Name:   Luis [name]  Date of Exam: 04/17/2023 Medical Rec #:  [id number]        Height:       66.0 in Accession #:    [id number]       Weight:       264.3 lb Date of Birth:  17-Sep-1955        BSA:          2.251 m Patient Age:    67 years         BP:           126/50 mmHg Patient Gender: M                HR:           55 bpm. Exam Location:  Inpatient Procedure: Transesophageal Echo, Color Doppler and Cardiac Doppler Indications:     Bacteremia  History:         Patient has prior history of Echocardiogram examinations, most                  recent 04/15/2023. CHF, CAD; Risk Factors:Hypertension,                  Dyslipidemia and Sleep Apnea.                  Aortic Valve: 25 mm Edwards Inspiris Resilia valve is present                  in the aortic position.  Sonographer:     Delcie Roch RDCS Referring Phys:  [id number] Corrin Parker Diagnosing Phys: Donato Schultz MD PROCEDURE: After discussion of the risks and benefits of a TEE, an informed consent was obtained from the patient. The transesophogeal probe was passed without difficulty through the esophogus of the patient. Imaged were obtained with the patient in a left lateral decubitus position. Sedation performed by different physician. The patient was monitored while under deep sedation. Anesthestetic sedation was provided intravenously by Anesthesiology: 140mg  of Propofol, 100mg  of Lidocaine. The patient's vital signs; including heart rate, blood pressure, and oxygen saturation; remained stable throughout the procedure. The patient developed no complications during the procedure.  IMPRESSIONS  1. Left ventricular ejection fraction, by estimation, is 60 to 65%. The left ventricle has normal function. The left ventricle has no regional wall motion abnormalities.  2. Right ventricular systolic function is normal. The right ventricular size is normal.  3. No left atrial/left atrial appendage thrombus was detected.  4. The mitral valve is normal in structure. Mild mitral valve  regurgitation. No evidence of mitral stenosis.  5. The aortic valve has been repaired/replaced. Aortic valve regurgitation is not visualized. No aortic stenosis is present. There is a 25 mm Edwards Inspiris Resilia valve present in the aortic position. Echo findings are consistent with normal structure and function of the aortic valve prosthesis.  6. Aortic root/ascending aorta has been repaired/replaced.  7. The inferior vena cava is normal in size with greater than 50% respiratory variability, suggesting right atrial pressure of 3 mmHg. Conclusion(s)/Recommendation(s): No evidence of vegetation/infective endocarditis on this transesophageael echocardiogram. FINDINGS  Left Ventricle: Left ventricular ejection fraction, by estimation, is 60 to 65%. The left ventricle has normal function. The left ventricle has no regional wall motion abnormalities. The left ventricular internal cavity size was normal in size. There is  no left ventricular hypertrophy.  Right Ventricle: The right ventricular size is normal. No increase in right ventricular wall thickness. Right ventricular systolic function is normal. Left Atrium: Left atrial size was normal in size. No left atrial/left atrial appendage thrombus was detected. Right Atrium: Right atrial size was normal in size. Pericardium: There is no evidence of pericardial effusion. Mitral Valve: The mitral valve is normal in structure. Mild mitral valve regurgitation. No evidence of mitral valve stenosis. Tricuspid Valve: The tricuspid valve is normal in structure. Tricuspid valve regurgitation is not demonstrated. No evidence of tricuspid stenosis. Aortic Valve: The aortic valve has been repaired/replaced. Aortic valve regurgitation is not visualized. No aortic stenosis is present. Aortic valve mean gradient measures 2.0 mmHg. Aortic valve peak gradient measures 3.2 mmHg. There is a 25 mm Edwards Inspiris Resilia valve present in the aortic position. Echo findings are consistent  with normal structure and function of the aortic valve prosthesis. Pulmonic Valve: The pulmonic valve was normal in structure. Pulmonic valve regurgitation is not visualized. No evidence of pulmonic stenosis. Aorta: The aortic root/ascending aorta has been repaired/replaced. Venous: The inferior vena cava is normal in size with greater than 50% respiratory variability, suggesting right atrial pressure of 3 mmHg. IAS/Shunts: No atrial level shunt detected by color flow Doppler.  AORTIC VALVE AV Vmax:      89.40 cm/s AV Vmean:     63.400 cm/s AV VTI:       0.182 m AV Peak Grad: 3.2 mmHg AV Mean Grad: 2.0 mmHg Donato Schultz MD Electronically signed by Donato Schultz MD Signature Date/Time: 04/17/2023/1:16:47 PM    Final    [place] EKG SITE RITE Result Date: 04/17/2023 If Site Rite image not attached, placement could not be confirmed due to current cardiac rhythm.  EP STUDY Result Date: 04/17/2023 See surgical note for result.   Anti-infectives: Anti-infectives (From admission, onward)    Start     Dose/Rate Route Frequency Ordered Stop   04/17/23 0000  ampicillin IVPB        12 g Intravenous Every 24 hours 04/17/23 1447 05/12/23 2359   04/16/23 1200  ampicillin (OMNIPEN) 2 g in sodium chloride 0.9 % 100 mL IVPB        2 g 300 mL/hr over 20 Minutes Intravenous Every 4 hours 04/16/23 1121     04/14/23 1030  cefTRIAXone (ROCEPHIN) 2 g in sodium chloride 0.9 % 100 mL IVPB  Status:  Discontinued        2 g 200 mL/hr over 30 Minutes Intravenous Every 12 hours 04/14/23 0938 04/17/23 1043   04/14/23 0030  ampicillin (OMNIPEN) 2 g in sodium chloride 0.9 % 100 mL IVPB  Status:  Discontinued        2 g 300 mL/hr over 20 Minutes Intravenous Every 6 hours 04/13/23 2332 04/16/23 1121   04/13/23 2300  ceFEPIme (MAXIPIME) 2 g in sodium chloride 0.9 % 100 mL IVPB  Status:  Discontinued        2 g 200 mL/hr over 30 Minutes Intravenous Every 12 hours 04/13/23 1727 04/13/23 2331   04/13/23 1100  vancomycin (VANCOREADY)  IVPB 2000 mg/400 mL        2,000 mg 200 mL/hr over 120 Minutes Intravenous  Once 04/13/23 1025 04/13/23 1354   04/13/23 1015  ceFEPIme (MAXIPIME) 2 g in sodium chloride 0.9 % 100 mL IVPB        2 g 200 mL/hr over 30 Minutes Intravenous  Once 04/13/23 1014 04/13/23 1140   04/13/23 1015  metroNIDAZOLE (FLAGYL)  IVPB 500 mg        500 mg 100 mL/hr over 60 Minutes Intravenous  Once 04/13/23 1014 04/13/23 1215   04/13/23 1015  vancomycin (VANCOCIN) IVPB 1000 mg/200 mL premix  Status:  Discontinued        1,000 mg 200 mL/hr over 60 Minutes Intravenous  Once 04/13/23 1014 04/13/23 1025       Assessment/Plan:  Keep remaining Rt bander stent. We will remove in outpatinet elective setting under ampicillin coverage.   Again greatly appreciate hospitalist, ID, cardiology teams aggressive management.   Certainly OK for DC from from GU perspective.    Loletta Parish. 04/18/2023

## 2023-04-18 NOTE — Plan of Care (Signed)

## 2023-04-18 NOTE — Plan of Care (Signed)
  Problem: Clinical Measurements: Goal: Respiratory complications will improve Outcome: Progressing Goal: Cardiovascular complication will be avoided Outcome: Progressing   Problem: Activity: Goal: Risk for activity intolerance will decrease Outcome: Progressing   Problem: Nutrition: Goal: Adequate nutrition will be maintained Outcome: Progressing   Problem: Coping: Goal: Level of anxiety will decrease Outcome: Progressing   Problem: Elimination: Goal: Will not experience complications related to urinary retention Outcome: Progressing   Problem: Pain Management: Goal: General experience of comfort will improve Outcome: Progressing   Problem: Safety: Goal: Ability to remain free from injury will improve Outcome: Progressing

## 2023-04-19 ENCOUNTER — Other Ambulatory Visit: Payer: Self-pay

## 2023-04-20 ENCOUNTER — Other Ambulatory Visit: Payer: Self-pay

## 2023-04-20 ENCOUNTER — Telehealth: Payer: Self-pay

## 2023-04-20 ENCOUNTER — Other Ambulatory Visit: Payer: Self-pay | Admitting: Hematology and Oncology

## 2023-04-20 ENCOUNTER — Inpatient Hospital Stay: Payer: PPO | Attending: Hematology and Oncology

## 2023-04-20 ENCOUNTER — Inpatient Hospital Stay (HOSPITAL_BASED_OUTPATIENT_CLINIC_OR_DEPARTMENT_OTHER): Payer: PPO | Admitting: Hematology and Oncology

## 2023-04-20 VITALS — BP 135/64 | HR 63 | Temp 97.6°F | Resp 16 | Wt 256.3 lb

## 2023-04-20 DIAGNOSIS — R21 Rash and other nonspecific skin eruption: Secondary | ICD-10-CM | POA: Insufficient documentation

## 2023-04-20 DIAGNOSIS — Z801 Family history of malignant neoplasm of trachea, bronchus and lung: Secondary | ICD-10-CM | POA: Insufficient documentation

## 2023-04-20 DIAGNOSIS — Z87891 Personal history of nicotine dependence: Secondary | ICD-10-CM | POA: Insufficient documentation

## 2023-04-20 DIAGNOSIS — C673 Malignant neoplasm of anterior wall of bladder: Secondary | ICD-10-CM | POA: Insufficient documentation

## 2023-04-20 DIAGNOSIS — C679 Malignant neoplasm of bladder, unspecified: Secondary | ICD-10-CM

## 2023-04-20 DIAGNOSIS — Z79899 Other long term (current) drug therapy: Secondary | ICD-10-CM | POA: Diagnosis not present

## 2023-04-20 DIAGNOSIS — Z800 Family history of malignant neoplasm of digestive organs: Secondary | ICD-10-CM | POA: Diagnosis not present

## 2023-04-20 LAB — CMP (CANCER CENTER ONLY)
ALT: 30 U/L (ref 0–44)
AST: 24 U/L (ref 15–41)
Albumin: 3.2 g/dL — ABNORMAL LOW (ref 3.5–5.0)
Alkaline Phosphatase: 103 U/L (ref 38–126)
Anion gap: 8 (ref 5–15)
BUN: 9 mg/dL (ref 8–23)
CO2: 27 mmol/L (ref 22–32)
Calcium: 8.6 mg/dL — ABNORMAL LOW (ref 8.9–10.3)
Chloride: 106 mmol/L (ref 98–111)
Creatinine: 1.36 mg/dL — ABNORMAL HIGH (ref 0.61–1.24)
GFR, Estimated: 56 mL/min — ABNORMAL LOW (ref >60)
Glucose, Bld: 149 mg/dL — ABNORMAL HIGH (ref 70–99)
Potassium: 3.9 mmol/L (ref 3.5–5.1)
Sodium: 141 mmol/L (ref 135–145)
Total Bilirubin: 0.4 mg/dL (ref <1.2)
Total Protein: 7.3 g/dL (ref 6.5–8.1)

## 2023-04-20 LAB — CBC WITH DIFFERENTIAL (CANCER CENTER ONLY)
Abs Immature Granulocytes: 0.07 10*3/uL (ref 0.00–0.07)
Basophils Absolute: 0 10*3/uL (ref 0.0–0.1)
Basophils Relative: 0 %
Eosinophils Absolute: 0.3 10*3/uL (ref 0.0–0.5)
Eosinophils Relative: 5 %
HCT: 28.6 % — ABNORMAL LOW (ref 39.0–52.0)
Hemoglobin: 8.8 g/dL — ABNORMAL LOW (ref 13.0–17.0)
Immature Granulocytes: 1 %
Lymphocytes Relative: 22 %
Lymphs Abs: 1.4 10*3/uL (ref 0.7–4.0)
MCH: 26.7 pg (ref 26.0–34.0)
MCHC: 30.8 g/dL (ref 30.0–36.0)
MCV: 86.7 fL (ref 80.0–100.0)
Monocytes Absolute: 0.3 10*3/uL (ref 0.1–1.0)
Monocytes Relative: 5 %
Neutro Abs: 4 10*3/uL (ref 1.7–7.7)
Neutrophils Relative %: 67 %
Platelet Count: 204 10*3/uL (ref 150–400)
RBC: 3.3 MIL/uL — ABNORMAL LOW (ref 4.22–5.81)
RDW: 15.3 % (ref 11.5–15.5)
WBC Count: 6 10*3/uL (ref 4.0–10.5)
nRBC: 0 % (ref 0.0–0.2)

## 2023-04-20 LAB — CULTURE, BLOOD (ROUTINE X 2)

## 2023-04-20 LAB — TSH: TSH: 1.393 u[IU]/mL (ref 0.350–4.500)

## 2023-04-20 NOTE — Progress Notes (Signed)
[facility] [phone]   [phone]  PROGRESS NOTE  Patient Care Team: Nelwyn Salisbury, MD as PCP - General Quintella Reichert, MD as PCP - Sleep Medicine (Cardiology) Laurey Morale, MD as PCP - Advanced Heart Failure (Cardiology) Hillis Range, MD (Inactive) as Consulting Physician (Cardiology) Verner Chol, [facility] (Inactive) as Pharmacist (Pharmacist)  Hematological/Oncological History # BCG-Unresponsive High Risk Non-Muscle Invasive Bladder Cancer 06/2020: left bladder neck recurrence, TURBT T1G3 11/2020: restaging TURBT showed CIS 12/2020: redinduction BCG x6 (deemed not a good surgical candidate)  06/2021: left dome early recurrence 3 cm erythema, no papillary tumor 04/2021: chronic left dome erythema, new left base lateral papillary tumor (3 cm) 06/18/2022: T1G3 prostatic urethra and multifocal bladder 07/29/2022: establish care with Dr. [name]  08/12/2022: Cycle 1 of Pembrolizumab 09/02/2022: Cycle 2 of Pembrolizumab  09/23/2022: Cycle 3 of Pembrolizumab 10/14/2022: Cycle 4 of Pembrolizumab 11/04/2022: Cycle 5 of Pembrolizumab 11/25/2022: Cycle 6 of Pembrolizumab 12/20/2022: Cycle 7 of Pembrolizumab 01/13/2023: Cycle 8 of Pembrolizumab  03/03/2023: Cystectomy/Prostatectomy showed infiltrative high-grade urothelial carcinoma, size 5.8 cm involving bladder dome, anterior wall and prostatic stroma. Tumor invades directly into prostatic stroma at apex and mid portion of the gland (pT4a). Metastatic urothelial cancer in one of two left common iliac lymph nodes.   CHIEF COMPLAINTS/PURPOSE OF CONSULTATION:  "High Risk Non-Muscle Invasive Bladder Cancer "  HISTORY OF PRESENTING ILLNESS:  Luis [name] 67 y.o. 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 In the interim since our last visit he was admitted to the hospital with urosepsis from 04/13/2023 to  04/18/2023.  On exam today Luis [name] reports he is still recovering from his treatment.  He reports to be on antibiotic therapy until 05/12/2023.  He is receiving IV antibiotic therapy at home via his pump.  He is having difficulty with his pump with someone stopping by the house today to help him with this.  He reports that his appetite is improving and he feels like he is eating better but his energy levels are still quite low.  He reports his energy is about a 5 or 6 out of 10.  He notes that occasionally his legs do give out due to weakness.  He reports that he feels like he is gradually recovering and that he does need more time to recover before the start of treatment.  He denies fevers, chills, sweats, shortness of breath, chest pain or cough. He has no other complaints. A full 10 point ROS is otherwise negative.  MEDICAL HISTORY:  Past Medical History:  Diagnosis Date   Anxiety    takes Valium as needed   Aortic stenosis, moderate    Arthritis    Ascending aortic aneurysm (HCC)    CAD (coronary artery disease)    a. s/p PCI of the RCA 8/12 with DES by Dr Excell Seltzer, preserved EF. b. LHC/RHC (2/16) with mean RA 12, PA 32/15, mean PCWP 18, CI 3.47; patent mid and distal RCA stents, 50-60% proximal stenosis small PDA.      Cancer [facility])    bladder   Carotid stenosis    a. Carotid US (05/2013):  Bilateral 1-39% ICA; L thyroid nodule (prior hx of aspiration).   Chronic diastolic CHF (congestive heart failure) (HCC)    Complication of anesthesia    difficulty waking up after gallbladder surgery   Depression    Dyspnea    Essential hypertension  GERD (gastroesophageal reflux disease)    if needed will take OTC meds    Heart murmur    History of colonic polyps    hyperplastic   Hyperlipidemia    Joint pain    Lesion of bladder    Myocardial infarction (HCC) 2012   Obesity (BMI 30-39.9) 02/29/2016   Pre-diabetes    Restless leg    Sleep apnea    uses cpap   Tubular adenoma of colon     Vertigo    takes Meclizine as needed    SURGICAL HISTORY: Past Surgical History:  Procedure Laterality Date   AORTIC VALVE REPLACEMENT N/A 09/16/2021   Procedure: AORTIC VALVE REPLACEMENT (AVR);  Surgeon: Alleen Borne, MD;  Location: [facility] OR;  Service: Open Heart Surgery;  Laterality: N/A;   CATARACT EXTRACTION   4 YRS AGO   BOTH EYES   CHOLECYSTECTOMY  07/21/2011   Procedure: LAPAROSCOPIC CHOLECYSTECTOMY WITH INTRAOPERATIVE CHOLANGIOGRAM;  Surgeon: Kandis Cocking, MD;  Location: [facility] ORS;  Service: General;  Laterality: N/A;   CORONARY ANGIOPLASTY  2012   2 stents   coronary stenting     s/p PCI of the RCA by Dr Excell Seltzer 8/12 with 2 promus stents   CYSTOSCOPY W/ RETROGRADES Bilateral 12/13/2019   Procedure: CYSTOSCOPY WITH RETROGRADE PYELOGRAM;  Surgeon: Sebastian Ache, MD;  Location: [facility];  Service: Urology;  Laterality: Bilateral;   CYSTOSCOPY W/ RETROGRADES Bilateral 10/14/2020   Procedure: CYSTOSCOPY WITH RETROGRADE PYELOGRAM;  Surgeon: Sebastian Ache, MD;  Location: [facility];  Service: Urology;  Laterality: Bilateral;   CYSTOSCOPY W/ RETROGRADES Bilateral 12/16/2020   Procedure: CYSTOSCOPY WITH RETROGRADE PYELOGRAM;  Surgeon: Sebastian Ache, MD;  Location: [facility];  Service: Urology;  Laterality: Bilateral;   CYSTOSCOPY W/ RETROGRADES Bilateral 06/03/2022   Procedure: CYSTOSCOPY WITH RETROGRADE PYELOGRAM;  Surgeon: Sebastian Ache, MD;  Location: [facility];  Service: Urology;  Laterality: Bilateral;   CYSTOSCOPY W/ RETROGRADES Bilateral 12/28/2022   Procedure: CYSTOSCOPY WITH RETROGRADE PYELOGRAM, FULGARATION OF BLEEDERS;  Surgeon: Loletta Parish., MD;  Location: [facility];  Service: Urology;  Laterality: Bilateral;   CYSTOSCOPY WITH INJECTION N/A 03/03/2023   Procedure: CYSTOSCOPY WITH INDOCYANINE INJECTION;  Surgeon: Loletta Parish., MD;  Location: [facility];  Service: Urology;  Laterality: N/A;  360 MINUTES   LEFT AND RIGHT  HEART CATHETERIZATION WITH CORONARY ANGIOGRAM N/A 06/23/2014   Procedure: LEFT AND RIGHT HEART CATHETERIZATION WITH CORONARY ANGIOGRAM;  Surgeon: Laurey Morale, MD;  Location: [facility] CATH LAB;  Service: Cardiovascular;  Laterality: N/A;   NECK SURGERY  03/23/09   per Dr. Ophelia Charter, cervical fusion    PERICARDIOCENTESIS N/A 09/28/2021   Procedure: PERICARDIOCENTESIS;  Surgeon: Corky Crafts, MD;  Location: [facility] INVASIVE CV LAB;  Service: Cardiovascular;  Laterality: N/A;   REPLACEMENT ASCENDING AORTA N/A 09/16/2021   Procedure: REPLACEMENT ASCENDING AORTA WITH 30 X HEMASHIELD PLATINUM WOVEN DOUBLE VELOUR VASCULAR GRAFT;  Surgeon: Alleen Borne, MD;  Location: [facility] OR;  Service: Open Heart Surgery;  Laterality: N/A;  CIRC ARREST   right elbow surgery     RIGHT HEART CATH AND CORONARY ANGIOGRAPHY N/A 07/08/2021   Procedure: RIGHT HEART CATH AND CORONARY ANGIOGRAPHY;  Surgeon: Laurey Morale, MD;  Location: [facility] INVASIVE CV LAB;  Service: Cardiovascular;  Laterality: N/A;   ROBOT ASSISTED LAPAROSCOPIC COMPLETE CYSTECT ILEAL CONDUIT N/A 03/03/2023   Procedure: XI ROBOTIC ASSISTED LAPAROSCOPIC COMPLETE CYSTECTECTOMY WITH  ILEAL CONDUIT DIVERSION;  Surgeon: Gypsy Balsam  Montez Hageman., MD;  Location: [facility];  Service: Urology;  Laterality: N/A;   ROBOT ASSISTED LAPAROSCOPIC RADICAL PROSTATECTOMY N/A 03/03/2023   Procedure: XI ROBOTIC ASSISTED LAPAROSCOPIC RADICAL PROSTATECTOMY WITH LYMPH NODE DISSECTION;  Surgeon: Loletta Parish., MD;  Location: [facility];  Service: Urology;  Laterality: N/A;   solonscopy  05/23/08   per Dr. Celene Kras hemorrhoids only, repeat in 5 years   SUBXYPHOID PERICARDIAL WINDOW N/A 09/28/2021   Procedure: SUBXYPHOID PERICARDIAL WINDOW;  Surgeon: Alleen Borne, MD;  Location: [facility] OR;  Service: Thoracic;  Laterality: N/A;   TEE WITHOUT CARDIOVERSION N/A 06/23/2014   Procedure: TRANSESOPHAGEAL ECHOCARDIOGRAM (TEE);  Surgeon: Laurey Morale, MD;  Location: [facility] ENDOSCOPY;  Service:  Cardiovascular;  Laterality: N/A;   TEE WITHOUT CARDIOVERSION N/A 01/21/2016   Procedure: TRANSESOPHAGEAL ECHOCARDIOGRAM (TEE);  Surgeon: Laurey Morale, MD;  Location: [facility] ENDOSCOPY;  Service: Cardiovascular;  Laterality: N/A;   TEE WITHOUT CARDIOVERSION N/A 09/16/2021   Procedure: TRANSESOPHAGEAL ECHOCARDIOGRAM (TEE);  Surgeon: Alleen Borne, MD;  Location: [facility] OR;  Service: Open Heart Surgery;  Laterality: N/A;   TEE WITHOUT CARDIOVERSION N/A 09/28/2021   Procedure: TRANSESOPHAGEAL ECHOCARDIOGRAM (TEE);  Surgeon: Alleen Borne, MD;  Location: [facility] OR;  Service: Thoracic;  Laterality: N/A;   TONSILLECTOMY     TRANSESOPHAGEAL ECHOCARDIOGRAM (CATH LAB) N/A 04/17/2023   Procedure: TRANSESOPHAGEAL ECHOCARDIOGRAM;  Surgeon: Jake Bathe, MD;  Location: [facility] INVASIVE CV LAB;  Service: Cardiovascular;  Laterality: N/A;   TRANSURETHRAL RESECTION OF BLADDER TUMOR N/A 10/21/2019   Procedure: TRANSURETHRAL RESECTION OF BLADDER TUMOR (TURBT);  Surgeon: Ihor Gully, MD;  Location[facility];  Service: Urology;  Laterality: N/A;   TRANSURETHRAL RESECTION OF BLADDER TUMOR N/A 12/13/2019   Procedure: TRANSURETHRAL RESECTION OF BLADDER TUMOR (TURBT);  Surgeon: Sebastian Ache, MD;  Location: [facility];  Service: Urology;  Laterality: N/A;  1 HR   TRANSURETHRAL RESECTION OF BLADDER TUMOR N/A 10/14/2020   Procedure: TRANSURETHRAL RESECTION OF BLADDER TUMOR (TURBT);  Surgeon: Sebastian Ache, MD;  Location: [facility];  Service: Urology;  Laterality: N/A;   TRANSURETHRAL RESECTION OF BLADDER TUMOR N/A 12/16/2020   Procedure: RESTAGING TRANSURETHRAL RESECTION OF BLADDER TUMOR (TURBT);  Surgeon: Sebastian Ache, MD;  Location: [facility];  Service: Urology;  Laterality: N/A;   TRANSURETHRAL RESECTION OF BLADDER TUMOR N/A 06/03/2022   Procedure: TRANSURETHRAL RESECTION OF BLADDER TUMOR (TURBT);  Surgeon: Sebastian Ache, MD;  [facility];  Service:  Urology;  Laterality: N/A;   UMBILICAL HERNIA REPAIR  03/03/2023   Procedure: HERNIA REPAIR UMBILICAL;  Surgeon: Loletta Parish., MD;  Location: [facility];  Service: Urology;;    SOCIAL HISTORY: Social History   Socioeconomic History   Marital status: Married    Spouse name: Not on file   Number of children: 4   Years of education: Not on file   Highest education level: Not on file  Occupational History   Occupation: Manufacturing engineer    Employer: [employer]  Tobacco Use   Smoking status: Former    Current packs/day: 0.00    Average packs/day: 2.0 packs/day for 40.0 years (80.0 ttl pk-yrs)    Types: Cigarettes    Start date: 81    Quit date: 2012    Years since quitting: 12.9   Smokeless tobacco: Never  Vaping Use   Vaping status: Never Used  Substance and Sexual Activity   Alcohol use: No    Alcohol/week: 0.0 standard drinks  of alcohol   Drug use: No   Sexual activity: Not on file  Other Topics Concern   Not on file  Social History Narrative   Not on file   Social Drivers of Health   Financial Resource Strain: Low Risk  (11/03/2022)   Overall Financial Resource Strain (CARDIA)    Difficulty of Paying Living Expenses: Not hard at all  Food Insecurity: No Food Insecurity (04/20/2023)   Hunger Vital Sign    Worried About Running Out of Food in the Last Year: Never true    Ran Out of Food in the Last Year: Never true  Transportation Needs: No Transportation Needs (04/20/2023)   PRAPARE - Administrator, Civil Service (Medical): No    Lack of Transportation (Non-Medical): No  Physical Activity: Unknown (11/03/2022)   Exercise Vital Sign    Days of Exercise per Week: Patient unable to answer    Minutes of Exercise per Session: 30 min  Stress: No Stress Concern Present (11/03/2022)   [employer] of Occupational Health - Occupational Stress Questionnaire    Feeling of Stress : Not at all  Social Connections: Moderately Isolated  (11/03/2022)   Social Connection and Isolation Panel [NHANES]    Frequency of Communication with Friends and Family: More than three times a week    Frequency of Social Gatherings with Friends and Family: More than three times a week    Attends Religious Services: Never    Database administrator or Organizations: No    Attends Banker Meetings: Never    Marital Status: Married  Catering manager Violence: Not At Risk (04/20/2023)   Humiliation, Afraid, Rape, and Kick questionnaire    Fear of Current or Ex-Partner: No    Emotionally Abused: No    Physically Abused: No    Sexually Abused: No    FAMILY HISTORY: Family History  Problem Relation Age of Onset   Lung cancer Mother        lung   Esophageal cancer Cousin    Colon cancer Neg Hx    Rectal cancer Neg Hx    Stomach cancer Neg Hx     ALLERGIES:  has no known allergies.  MEDICATIONS:  Current Outpatient Medications  Medication Sig Dispense Refill   acetaminophen (TYLENOL) 325 MG tablet Take 650 mg by mouth every 6 (six) hours as needed (for pain).     ampicillin IVPB Inject 12 g into the vein daily for 25 days. As a continuous infusion. Indication:  E faecalis bacteremia First Dose: Yes Last Day of Therapy:  05/12/23  Labs - Once weekly:  CBC/D and BMP, Labs - Once weekly: ESR and CRP Method of administration: Ambulatory Pump (Continuous Infusion) Method of administration may be changed at the discretion of home infusion pharmacist based upon assessment of the patient and/or caregiver's ability to self-administer the medication ordered. 25 Units 0   aspirin EC 81 MG tablet Take 81 mg by mouth in the morning.     [Paused] carvedilol (COREG) 3.125 MG tablet TAKE 1 TABLET BY MOUTH TWICE A DAY WITH FOOD (Patient not taking: Reported on 04/20/2023) 180 tablet 1   diazepam (VALIUM) 5 MG tablet TAKE 1 TABLET BY MOUTH EVERY 12 HOURS AS NEEDED FOR ANXIETY. (Patient taking differently: Take 5 mg by mouth every 12 (twelve)  hours as needed for anxiety.) 60 tablet 5   ferrous sulfate 325 (65 FE) MG tablet Take 325 mg by mouth daily with breakfast.  furosemide (LASIX) 20 MG tablet Take 2 tablets (40 mg total) by mouth 2 (two) times daily. 360 tablet 3   ipratropium (ATROVENT) 0.03 % nasal spray Place 2 sprays into both nostrils every 12 (twelve) hours as needed for rhinitis.     Magnesium 500 MG TABS Take 500 mg by mouth in the morning.     meclizine (ANTIVERT) 25 MG tablet Take 1 tablet (25 mg total) by mouth 3 (three) times daily as needed for dizziness. 30 tablet 0   multivitamin-iron-minerals-folic acid (CENTRUM) chewable tablet Chew 1 tablet by mouth daily.     nitroGLYCERIN (NITROSTAT) 0.4 MG SL tablet Place 0.4 mg under the tongue every 5 (five) minutes as needed for chest pain.     oxyCODONE (ROXICODONE) 5 MG immediate release tablet Take 1 tablet (5 mg total) by mouth every 6 (six) hours as needed for breakthrough pain (post-operatively). 20 tablet 0   potassium chloride SA (KLOR-CON M20) 20 MEQ tablet Take 2 tablets (40 mEq total) by mouth daily. (Patient taking differently: Take 20 mEq by mouth 2 (two) times daily.) 180 tablet 3   rosuvastatin (CRESTOR) 20 MG tablet TAKE 1 TABLET BY MOUTH EVERYDAY AT BEDTIME (Patient taking differently: Take 20 mg by mouth at bedtime.) 90 tablet 3   venlafaxine XR (EFFEXOR-XR) 150 MG 24 hr capsule TAKE 1 CAPSULE BY MOUTH DAILY WITH BREAKFAST. 90 capsule 0   No current facility-administered medications for this visit.    REVIEW OF SYSTEMS:   Constitutional: ( - ) fevers, ( - )  chills , ( - ) night sweats Eyes: ( - ) blurriness of vision, ( - ) double vision, ( - ) watery eyes Ears, nose, mouth, throat, and face: ( - ) mucositis, ( - ) sore throat Respiratory: ( - ) cough, ( - ) dyspnea, ( - ) wheezes Cardiovascular: ( - ) palpitation, ( - ) chest discomfort, ( - ) lower extremity swelling Gastrointestinal:  ( - ) nausea, ( - ) heartburn, ( - ) change in bowel  habits Skin: ( - ) abnormal skin rashes Lymphatics: ( - ) new lymphadenopathy, ( - ) easy bruising Neurological: ( - ) numbness, ( - ) tingling, ( - ) new weaknesses Behavioral/Psych: ( - ) mood change, ( - ) new changes  All other systems were reviewed with the patient and are negative.  PHYSICAL EXAMINATION: ECOG PERFORMANCE STATUS: 1 - Symptomatic but completely ambulatory  Vitals:   04/20/23 1505  BP: 135/64  Pulse: 63  Resp: 16  Temp: 97.6 F (36.4 C)  SpO2: 98%   Filed Weights   04/20/23 1505  Weight: 256 lb 4.8 oz (116.3 kg)    GENERAL: well appearing elderly Caucasian male in NAD  SKIN: skin color, texture, turgor are normal, no rashes or significant lesions EYES: conjunctiva are pink and non-injected, sclera clear LUNGS: clear to auscultation and percussion with normal breathing effort HEART: regular rate & rhythm and no murmurs and no lower extremity edema Musculoskeletal: no cyanosis of digits and no clubbing  PSYCH: alert & oriented x 3, fluent speech NEURO: no focal motor/sensory deficits  LABORATORY DATA:  I have reviewed the data as listed    Latest Ref Rng & Units 04/20/2023    2:38 PM 04/18/2023    5:43 AM 04/17/2023    4:56 AM  CBC  WBC 4.0 - 10.5 K/uL 6.0  6.0  5.4   Hemoglobin 13.0 - 17.0 g/dL 8.8  7.8  8.6   Hematocrit  39.0 - 52.0 % 28.6  24.9  29.0   Platelets 150 - 400 K/uL 204  154  167        Latest Ref Rng & Units 04/20/2023    2:38 PM 04/18/2023    5:43 AM 04/17/2023    4:56 AM  CMP  Glucose 70 - 99 mg/dL 413  244  010   BUN 8 - 23 mg/dL 9  11  11    Creatinine 0.61 - 1.24 mg/dL 2.72  5.36  6.44   Sodium 135 - 145 mmol/L 141  142  142   Potassium 3.5 - 5.1 mmol/L 3.9  3.1  3.5   Chloride 98 - 111 mmol/L 106  106  110   CO2 22 - 32 mmol/L 27  26  23    Calcium 8.9 - 10.3 mg/dL 8.6  8.2  8.2   Total Protein 6.5 - 8.1 g/dL 7.3     Total Bilirubin <1.2 mg/dL 0.4     Alkaline Phos 38 - 126 U/L 103     AST 15 - 41 U/L 24     ALT 0 -  44 U/L 30       RADIOGRAPHIC STUDIES: CT ANGIO CHEST AORTA W/CM & OR WO/CM Result Date: 04/18/2023 CLINICAL DATA:  Graft versus host disease EXAM: CT ANGIOGRAPHY CHEST WITH CONTRAST TECHNIQUE: Multidetector CT imaging of the chest was performed using the standard protocol during bolus administration of intravenous contrast. Multiplanar CT image reconstructions and MIPs were obtained to evaluate the vascular anatomy. RADIATION DOSE REDUCTION: This exam was performed according to the departmental dose-optimization program which includes automated exposure control, adjustment of the mA and/or kV according to patient size and/or use of iterative reconstruction technique. CONTRAST:  OMNIPAQUE IOHEXOL 350 MG/ML SOLN COMPARISON:  12/14/2022 FINDINGS: Cardiovascular: Preferential opacification of the thoracic aorta. Status post Bentall type aortic root graft repair with unchanged contour and caliber of both the graft and of the remaining portion of the vessel. No acute findings. Mild atherosclerosis. Normal heart size. Three-vessel coronary artery calcifications. No pericardial effusion. Right upper extremity PICC. Mediastinum/Nodes: No enlarged mediastinal, hilar, or axillary lymph nodes. Thyroid gland, trachea, and esophagus demonstrate no significant findings. Lungs/Pleura: Mild diffuse bilateral bronchial wall thickening. No pleural effusion or pneumothorax. Upper Abdomen: No acute abnormality. Hepatic steatosis. Status post cholecystectomy. Musculoskeletal: Status post median sternotomy. No acute osseous findings. Review of the MIP images confirms the above findings. IMPRESSION: 1. Status post Bentall type aortic root graft repair with unchanged contour and caliber of both the graft and the remaining portion of the vessel. No acute findings. 2. Mild diffuse bilateral bronchial wall thickening, consistent with nonspecific infectious or inflammatory bronchitis. 3. Coronary artery disease. 4. Hepatic  steatosis. Electronically Signed   By: Jearld Lesch M.D.   On: 04/18/2023 11:32   ECHO TEE Result Date: 04/17/2023    TRANSESOPHOGEAL ECHO REPORT   Patient Name:   ELVYN ALBACH Date of Exam: 04/17/2023 Medical Rec #:  [id number]        Height:       66.0 in Accession #:    [id number]       Weight:       264.3 lb Date of Birth:  06/23/1955        BSA:          2.251 m Patient Age:    67 years         BP:  126/50 mmHg Patient Gender: M                HR:           55 bpm. Exam Location:  Inpatient Procedure: Transesophageal Echo, Color Doppler and Cardiac Doppler Indications:     Bacteremia  History:         Patient has prior history of Echocardiogram examinations, most                  recent 04/15/2023. CHF, CAD; Risk Factors:Hypertension,                  Dyslipidemia and Sleep Apnea.                  Aortic Valve: 25 mm Edwards Inspiris Resilia valve is present                  in the aortic position.  Sonographer:     Delcie Roch RDCS Referring Phys:  [id number] Corrin Parker Diagnosing Phys: Donato Schultz MD PROCEDURE: After discussion of the risks and benefits of a TEE, an informed consent was obtained from the patient. The transesophogeal probe was passed without difficulty through the esophogus of the patient. Imaged were obtained with the patient in a left lateral decubitus position. Sedation performed by different physician. The patient was monitored while under deep sedation. Anesthestetic sedation was provided intravenously by Anesthesiology: 140mg  of Propofol, 100mg  of Lidocaine. The patient's vital signs; including heart rate, blood pressure, and oxygen saturation; remained stable throughout the procedure. The patient developed no complications during the procedure.  IMPRESSIONS  1. Left ventricular ejection fraction, by estimation, is 60 to 65%. The left ventricle has normal function. The left ventricle has no regional wall motion abnormalities.  2. Right ventricular systolic  function is normal. The right ventricular size is normal.  3. No left atrial/left atrial appendage thrombus was detected.  4. The mitral valve is normal in structure. Mild mitral valve regurgitation. No evidence of mitral stenosis.  5. The aortic valve has been repaired/replaced. Aortic valve regurgitation is not visualized. No aortic stenosis is present. There is a 25 mm Edwards Inspiris Resilia valve present in the aortic position. Echo findings are consistent with normal structure and function of the aortic valve prosthesis.  6. Aortic root/ascending aorta has been repaired/replaced.  7. The inferior vena cava is normal in size with greater than 50% respiratory variability, suggesting right atrial pressure of 3 mmHg. Conclusion(s)/Recommendation(s): No evidence of vegetation/infective endocarditis on this transesophageael echocardiogram. FINDINGS  Left Ventricle: Left ventricular ejection fraction, by estimation, is 60 to 65%. The left ventricle has normal function. The left ventricle has no regional wall motion abnormalities. The left ventricular internal cavity size was normal in size. There is  no left ventricular hypertrophy. Right Ventricle: The right ventricular size is normal. No increase in right ventricular wall thickness. Right ventricular systolic function is normal. Left Atrium: Left atrial size was normal in size. No left atrial/left atrial appendage thrombus was detected. Right Atrium: Right atrial size was normal in size. Pericardium: There is no evidence of pericardial effusion. Mitral Valve: The mitral valve is normal in structure. Mild mitral valve regurgitation. No evidence of mitral valve stenosis. Tricuspid Valve: The tricuspid valve is normal in structure. Tricuspid valve regurgitation is not demonstrated. No evidence of tricuspid stenosis. Aortic Valve: The aortic valve has been repaired/replaced. Aortic valve regurgitation is not visualized. No aortic stenosis is present. Aortic valve mean  gradient measures 2.0 mmHg. Aortic valve peak gradient measures 3.2 mmHg. There is a 25 mm Edwards Inspiris Resilia valve present in the aortic position. Echo findings are consistent with normal structure and function of the aortic valve prosthesis. Pulmonic Valve: The pulmonic valve was normal in structure. Pulmonic valve regurgitation is not visualized. No evidence of pulmonic stenosis. Aorta: The aortic root/ascending aorta has been repaired/replaced. Venous: The inferior vena cava is normal in size with greater than 50% respiratory variability, suggesting right atrial pressure of 3 mmHg. IAS/Shunts: No atrial level shunt detected by color flow Doppler.  AORTIC VALVE AV Vmax:      89.40 cm/s AV Vmean:     63.400 cm/s AV VTI:       0.182 m AV Peak Grad: 3.2 mmHg AV Mean Grad: 2.0 mmHg Donato Schultz MD Electronically signed by Donato Schultz MD Signature Date/Time: 04/17/2023/1:16:47 PM    Final    [place] EKG SITE RITE Result Date: 04/17/2023 If Site Rite image not attached, placement could not be confirmed due to current cardiac rhythm.  EP STUDY Result Date: 04/17/2023 See surgical note for result.  ECHOCARDIOGRAM COMPLETE Result Date: 04/15/2023    ECHOCARDIOGRAM REPORT   Patient Name:   JAVANTE FORONDA Date of Exam: 04/15/2023 Medical Rec #:  [id number]        Height:       66.0 in Accession #:    [id number]       Weight:       269.2 lb Date of Birth:  1955/12/29        BSA:          2.269 m Patient Age:    67 years         BP:           103/54 mmHg Patient Gender: M                HR:           57 bpm. Exam Location:  Inpatient Procedure: 2D Echo, Cardiac Doppler, Color Doppler and Intracardiac            Opacification Agent Indications:    Bacteremia  History:        Patient has prior history of Echocardiogram examinations, most                 recent 11/24/2022. CHF, CAD and Previous Myocardial Infarction,                 Arrythmias:Atrial Fibrillation, Signs/Symptoms:Dyspnea; Risk                  Factors:Hypertension, Dyslipidemia and Sleep Apnea.                 Aortic Valve: 25 mm Edwards Inspiris Resilia valve is present in                 the aortic position.  Sonographer:    Vern Claude Referring Phys: [id number] CORNELIUS N VAN DAM IMPRESSIONS  1. Left ventricular ejection fraction, by estimation, is 65 to 70%. The left ventricle has normal function. The left ventricle has no regional wall motion abnormalities. There is mild left ventricular hypertrophy. Left ventricular diastolic parameters are indeterminate.  2. Right ventricular systolic function is normal. The right ventricular size is normal. Tricuspid regurgitation signal is inadequate for assessing PA pressure.  3. The mitral valve is normal in structure. Trivial mitral valve regurgitation. No evidence of mitral stenosis.  4. The  aortic valve has been repaired/replaced. Aortic valve regurgitation is not visualized. There is a 25 mm Edwards Inspiris Resilia valve present in the aortic position. Vmax 2.2 m/s, MG , EOA 1.9 cm^2 Conclusion(s)/Recommendation(s): No evidence of valvular vegetations on this transthoracic echocardiogram, though poor quality study Consider a transesophageal echocardiogram to exclude infective endocarditis if clinically indicated. FINDINGS  Left Ventricle: Left ventricular ejection fraction, by estimation, is 65 to 70%. The left ventricle has normal function. The left ventricle has no regional wall motion abnormalities. The left ventricular internal cavity size was normal in size. There is  mild left ventricular hypertrophy. Left ventricular diastolic parameters are indeterminate. Right Ventricle: The right ventricular size is normal. No increase in right ventricular wall thickness. Right ventricular systolic function is normal. Tricuspid regurgitation signal is inadequate for assessing PA pressure. Left Atrium: Left atrial size was normal in size. Right Atrium: Right atrial size was normal in size. Pericardium: Trivial  pericardial effusion is present. Mitral Valve: The mitral valve is normal in structure. Trivial mitral valve regurgitation. No evidence of mitral valve stenosis. MV peak gradient, 6.2 mmHg. The mean mitral valve gradient is 2.0 mmHg. Tricuspid Valve: The tricuspid valve is normal in structure. Tricuspid valve regurgitation is trivial. Aortic Valve: The aortic valve has been repaired/replaced. Aortic valve regurgitation is not visualized. Aortic valve mean gradient measures 7.5 mmHg. Aortic valve peak gradient measures 17.3 mmHg. Aortic valve area, by VTI measures 2.16 cm. There is a 25 mm Edwards Inspiris Resilia valve present in the aortic position. Pulmonic Valve: The pulmonic valve was not well visualized. Pulmonic valve regurgitation is trivial. Aorta: The aortic root and ascending aorta are structurally normal, with no evidence of dilitation. IAS/Shunts: The interatrial septum was not well visualized.  LEFT VENTRICLE PLAX 2D LVIDd:         4.90 cm      Diastology LVIDs:         3.40 cm      LV e' medial:    6.42 cm/s LV PW:         1.20 cm      LV E/e' medial:  17.4 LV IVS:        1.00 cm      LV e' lateral:   17.30 cm/s LVOT diam:     1.70 cm      LV E/e' lateral: 6.5 LV SV:         81 LV SV Index:   36 LVOT Area:     2.27 cm  LV Volumes (MOD) LV vol d, MOD A2C: 143.0 ml LV vol d, MOD A4C: 214.0 ml LV vol s, MOD A2C: 31.1 ml LV vol s, MOD A4C: 48.3 ml LV SV MOD A2C:     111.9 ml LV SV MOD A4C:     214.0 ml LV SV MOD BP:      135.1 ml RIGHT VENTRICLE RV Basal diam:  3.70 cm RV Mid diam:    2.90 cm RV S prime:     14.50 cm/s TAPSE (M-mode): 2.9 cm LEFT ATRIUM             Index        RIGHT ATRIUM           Index LA diam:        3.90 cm 1.72 cm/m   RA Area:     20.50 cm LA Vol (A2C):   52.2 ml 23.01 ml/m  RA Volume:   55.20 ml  24.33 ml/m  LA Vol (A4C):   50.1 ml 22.08 ml/m LA Biplane Vol: 51.2 ml 22.57 ml/m  AORTIC VALVE                     PULMONIC VALVE AV Area (Vmax):    1.96 cm      PV Vmax:        1.10 m/s AV Area (Vmean):   1.83 cm      PV Peak grad:  4.8 mmHg AV Area (VTI):     2.16 cm AV Vmax:           208.00 cm/s AV Vmean:          125.500 cm/s AV VTI:            0.374 m AV Peak Grad:      17.3 mmHg AV Mean Grad:      7.5 mmHg LVOT Vmax:         180.00 cm/s LVOT Vmean:        101.000 cm/s LVOT VTI:          0.355 m LVOT/AV VTI ratio: 0.95  AORTA Ao Root diam: 3.60 cm Ao Asc diam:  3.10 cm MITRAL VALVE MV Area (PHT): 3.12 cm     SHUNTS MV Area VTI:   1.81 cm     Systemic VTI:  0.36 m MV Peak grad:  6.2 mmHg     Systemic Diam: 1.70 cm MV Mean grad:  2.0 mmHg MV Vmax:       1.25 m/s MV Vmean:      66.1 cm/s MV Decel Time: 243 msec MV E velocity: 112.00 cm/s MV A velocity: 98.30 cm/s MV E/A ratio:  1.14 Epifanio Lesches MD Electronically signed by Epifanio Lesches MD Signature Date/Time: 04/15/2023/2:02:09 PM    Final    CT ABDOMEN PELVIS WO CONTRAST Result Date: 04/13/2023 CLINICAL DATA:  Status post cystectomy on 03/03/2023 with pain and fever, initial encounter EXAM: CT ABDOMEN AND PELVIS WITHOUT CONTRAST TECHNIQUE: Multidetector CT imaging of the abdomen and pelvis was performed following the standard protocol without IV contrast. RADIATION DOSE REDUCTION: This exam was performed according to the departmental dose-optimization program which includes automated exposure control, adjustment of the mA and/or kV according to patient size and/or use of iterative reconstruction technique. COMPARISON:  12/21/2022 FINDINGS: Lower chest: No acute abnormality. Hepatobiliary: Fatty infiltration of the liver is noted. Gallbladder has been surgically removed. Pancreas: Unremarkable. No pancreatic ductal dilatation or surrounding inflammatory changes. Spleen: Normal in size without focal abnormality. Adrenals/Urinary Tract: Adrenal glands are within normal limits. Kidneys demonstrate no renal calculi. Small exophytic cysts are noted in left kidney stable from the prior exam. No further follow-up is  recommended. Mild fullness of the collecting systems is noted bilaterally. A ureteral stent is noted on the right. Interval cystectomy is noted with urostomy in the right lower quadrant. The ureteral stent on the right extends into the ostomy bag. Stomach/Bowel: Stomach is within normal limits. Small bowel shows no obstructive change. Scattered fluid and fecal material is noted throughout the colon. No obstructive changes are seen. Postsurgical changes in the right lower quadrant are noted consistent with creation of an ileal conduit Vascular/Lymphatic: Aortic atherosclerosis. No enlarged abdominal or pelvic lymph nodes. Reproductive: Prostate has been surgically removed during the cystectomy procedure. No fluid is noted in the operative bed. Other: No free fluid is noted.  No focal herniation is noted. Musculoskeletal: No acute or significant osseous findings. IMPRESSION: Changes consistent with history  of prior cystectomy and prostatectomy with ileal conduit formation in the right mid abdomen. Mild fullness of the collecting systems is noted bilaterally. A right-sided nephroureteral stent is noted extending into the ostomy bag. No evidence of postoperative abscess or extravasation is identified. No acute abnormality is seen. Electronically Signed   By: Alcide Clever M.D.   On: 04/13/2023 15:32   DG Chest Port 1 View Result Date: 04/13/2023 CLINICAL DATA:  Possible sepsis EXAM: PORTABLE CHEST 1 VIEW COMPARISON:  Chest x-ray October 20, 2021 FINDINGS: The cardiomediastinal silhouette is unchanged in contour status post median sternotomy. No focal pulmonary opacity. No pleural effusion or pneumothorax. The visualized upper abdomen is unremarkable. No acute osseous abnormality. IMPRESSION: No active disease. Electronically Signed   By: Jacob Moores M.D.   On: 04/13/2023 14:09    ASSESSMENT & PLAN Luis [name] 67 y.o. 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  After review of the labs, review of the records, and discussion with the patient the patients findings are most consistent with BCG unresponsive high risk non-muscle invasive bladder cancer, not a candidate for cystectomy.  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 21 days until progression or intolerance up to 24 months. --Started Cycle 1 of Pembrolizumab on 08/12/2022.  PLAN: --Labs from today were reviewed. WBC 6.0, Hgb 8.8, MCV 86.7, Plt 204 -- Given the results of the pathology showing positive margins and spread to the lymph node we are considering radiation therapy versus chemotherapy.  For chemotherapy I recommend gemcitabine and cisplatin x 4 cycles.  This would consist of gemcitabine 1000 mg/m on days 1 and 8 and cisplatin 70 mg/m on day 1 of 21-day cycle for 4 cycles. --Additionally prior to the start of adjuvant therapy would allow patient time to recover from his surgery. -- We will plan to proceed with adjuvant chemotherapy and radiation.  This will need to be delayed due to the fact the patient is currently on antibiotics.  His last day of antibiotics is Friday, 05/12/2023.  Will plan to see him back the following week to discuss the start of treatment. -Return to clinic in January 2025 to discuss start of treatment.  # Abdominal Rash--improved -- Small circular rash, approximately 3 inches in diameter. -- Patient has access to hydrocortisone 5% at home, recommend he apply that to the lesion 1-2 times daily to see if there is improvement.  #Supportive Care -- port placement not required  -- no pain medication required at this time.   No orders of the defined types were placed in this encounter.  All questions were answered. The patient knows to call the clinic with any problems, questions or concerns.  I have  spent a total of 30 minutes minutes of face-to-face and non-face-to-face time, preparing to see the patient, performing a medically appropriate examination, counseling and educating the patient, documenting clinical information in the electronic health record,and care coordination.   Ulysees Barns, MD Department of Hematology/Oncology [facility] Phone: [phone] Pager: [phone] Email: [email or web address]    04/20/2023 3:56 PM  Literature Support:  Pembrolizumab monotherapy for the treatment of high-risk non-muscle-invasive bladder cancer unresponsive to BCG [facility]): an open-label, single-arm, multicentre, phase 2 study,The Lancet Oncology,Volume 22, Issue 7,2021,Pages 919-930,ISSN 5366-4403  -- Pembrolizumab monotherapy was tolerable and showed promising antitumour  activity in patients with BCG-unresponsive non-muscle-invasive bladder cancer who declined or were ineligible for radical cystectomy and should be considered a a clinically active non-surgical treatment option in this difficult-to-treat population.  --All enrolled patients were assigned to receive pembrolizumab 200 mg intravenously every 3 weeks for up to 24 months or until centrally confirmed disease persistence, recurrence, or progression; unacceptable toxic effects

## 2023-04-20 NOTE — Transitions of Care (Post Inpatient/ED Visit) (Signed)
04/20/2023  Name: Luis [name] MRN: [id number] DOB: 03-14-1956  Today's TOC FU Call Status: Today's TOC FU Call Status:: Successful TOC FU Call Completed Patient's Name and Date of Birth confirmed.  Transition Care Management Follow-up Telephone Call Date of Discharge: 04/18/23 Discharge Facility: [facility]) Type of Discharge: Inpatient Admission Primary Inpatient Discharge Diagnosis:: Septic Shock How have you been since you were released from the hospital?: Same Any questions or concerns?: Yes Patient Questions/Concerns:: IV pump seems to be dispensing ampicillin too quickly Patient Questions/Concerns Addressed: Other: (Contacted [facility] who will address)  Items Reviewed: Did you receive and understand the discharge instructions provided?: Yes Medications obtained,verified, and reconciled?: Yes (Medications Reviewed) Any new allergies since your discharge?: No Dietary orders reviewed?: No Do you have support at home?: Yes People in Home: spouse Name of Support/Comfort Primary Source: [employer]  Medications Reviewed Today: Medications Reviewed Today     Reviewed by Jodelle Gross, RN (Case Manager) on 04/20/23 at 1243  Med List Status: <None>   Medication Order Taking? Sig Documenting Provider Last Dose Status Informant  acetaminophen (TYLENOL) 325 MG tablet [id number] Yes Take 650 mg by mouth every 6 (six) hours as needed (for pain). [provider] Taking Active Multiple Informants  ampicillin IVPB [id number] Yes Inject 12 g into the vein daily for 25 days. As a continuous infusion. Indication:  E faecalis bacteremia First Dose: Yes Last Day of Therapy:  05/12/23  Labs - Once weekly:  CBC/D and BMP, Labs - Once weekly: ESR and CRP Method of administration: Ambulatory Pump (Continuous Infusion) Method of administration may be changed at the discretion of home infusion pharmacist based upon assessment of the patient and/or caregiver's ability to  self-administer the medication ordered. Daiva Eves, Lisette Grinder, MD Taking Active   aspirin EC 81 MG tablet [id number] Yes Take 81 mg by mouth in the morning. [provider] Taking Active Multiple Informants  carvedilol (COREG) 3.125 MG tablet [id number] No TAKE 1 TABLET BY MOUTH TWICE A DAY WITH FOOD  Patient not taking: Reported on 04/20/2023   Laurey Morale, MD Not Taking Active Multiple Informants  diazepam (VALIUM) 5 MG tablet [id number] Yes TAKE 1 TABLET BY MOUTH EVERY 12 HOURS AS NEEDED FOR ANXIETY.  Patient taking differently: Take 5 mg by mouth every 12 (twelve) hours as needed for anxiety.   Nelwyn Salisbury, MD Taking Active Multiple Informants  ferrous sulfate 325 (65 FE) MG tablet [id number] Yes Take 325 mg by mouth daily with breakfast. [provider] Taking Active Multiple Informants  furosemide (LASIX) 20 MG tablet [id number] Yes Take 2 tablets (40 mg total) by mouth 2 (two) times daily. Lee, [place], NP Taking Active Multiple Informants  ipratropium (ATROVENT) 0.03 % nasal spray [id number] Yes Place 2 sprays into both nostrils every 12 (twelve) hours as needed for rhinitis. [provider] Taking Active Multiple Informants  Magnesium 500 MG TABS [id number] Yes Take 500 mg by mouth in the morning. [provider] Taking Active Multiple Informants  meclizine (ANTIVERT) 25 MG tablet [id number] Yes Take 1 tablet (25 mg total) by mouth 3 (three) times daily as needed for dizziness. Nelwyn Salisbury, MD Taking Active Multiple Informants  multivitamin-iron-minerals-folic acid (CENTRUM) chewable tablet [id number] Yes Chew 1 tablet by mouth daily. Barrett, Rae Roam, PA-C Taking Active Multiple Informants  nitroGLYCERIN (NITROSTAT) 0.4 MG SL tablet [id number] Yes Place 0.4 mg under the tongue every 5 (five) minutes as needed for chest pain. [provider] Taking Active Multiple  Informants           Med Note (ADSIDE, Novella Rob   Fri Mar 31, 2023  2:57 PM)     oxyCODONE (ROXICODONE) 5 MG immediate release tablet [id number] No Take 1 tablet (5 mg total) by mouth every 6 (six) hours as needed for breakthrough pain (post-operatively). Loletta Parish., MD Unknown Active Multiple Informants  potassium chloride SA (KLOR-CON M20) 20 MEQ tablet [id number] Yes Take 2 tablets (40 mEq total) by mouth daily.  Patient taking differently: Take 20 mEq by mouth 2 (two) times daily.   Laurey Morale, MD Taking Active Multiple Informants  rosuvastatin (CRESTOR) 20 MG tablet [id number] Yes TAKE 1 TABLET BY MOUTH EVERYDAY AT BEDTIME  Patient taking differently: Take 20 mg by mouth at bedtime.   [place], Anderson Malta, FNP Taking Active Multiple Informants  venlafaxine XR (EFFEXOR-XR) 150 MG 24 hr capsule [id number] Yes TAKE 1 CAPSULE BY MOUTH DAILY WITH BREAKFAST. Nelwyn Salisbury, MD Taking Active Multiple Informants  Med List Note Cardell Peach 03/01/23 1119): CPAP at night            Home Care and Equipment/Supplies: Were Home Health Services Ordered?: Yes Name of Home Health Agency:: [facility], PT, OT) Has Agency set up a time to come to your home?: Yes First Home Health Visit Date: 04/20/23 Any new equipment or medical supplies ordered?: Yes Name of Medical supply agency?: [facility] Were you able to get the equipment/medical supplies?: Yes Do you have any questions related to the use of the equipment/supplies?: Yes What questions do you have?: Rate of flow seems to make Ampicillin bag empty too quickly. [facility] to address  Functional Questionnaire: Do you need assistance with bathing/showering or dressing?: Yes Do you need assistance with meal preparation?: No Do you need assistance with eating?: No Do you have difficulty maintaining continence: No Do you need assistance with getting out of bed/getting out of a chair/moving?: No Do you have difficulty managing or taking your medications?: No  Follow up appointments  reviewed: PCP Follow-up appointment confirmed?: NA Specialist Hospital Follow-up appointment confirmed?: Yes Date of Specialist follow-up appointment?: 04/20/23 Follow-Up Specialty Provider:: Dr. [name] Do you need transportation to your follow-up appointment?: No Do you understand care options if your condition(s) worsen?: Yes-patient verbalized understanding  SDOH Interventions Today    Flowsheet Row Most Recent Value  SDOH Interventions   Food Insecurity Interventions Intervention Not Indicated  Housing Interventions Intervention Not Indicated  Transportation Interventions Intervention Not Indicated  Utilities Interventions Intervention Not Indicated      TOC Interventions Today    Flowsheet Row Most Recent Value  TOC Interventions   TOC Interventions Discussed/Reviewed TOC Interventions Discussed, TOC Interventions Reviewed, Contact DME company for patient use of equipment, Contacted Home Health RN/OT/PT  [Spoke with Ariel at [employer] about patients IV pump and spoke to Langdon at [facility] to be sure patient would be seen today by nurse.]       Jodelle Gross RN, BSN, CCM RN Care Manager  Transitions of Care  [facility] [employer]  [phone]

## 2023-04-21 LAB — T4: T4, Total: 6.5 ug/dL (ref 4.5–12.0)

## 2023-04-22 DIAGNOSIS — A498 Other bacterial infections of unspecified site: Secondary | ICD-10-CM | POA: Diagnosis not present

## 2023-04-23 ENCOUNTER — Other Ambulatory Visit: Payer: Self-pay

## 2023-04-25 ENCOUNTER — Ambulatory Visit (HOSPITAL_COMMUNITY)
Admission: RE | Admit: 2023-04-25 | Discharge: 2023-04-25 | Disposition: A | Discharge status: Home / Self Care | Payer: PPO | Source: Ambulatory Visit | Attending: Cardiology | Admitting: Cardiology

## 2023-04-25 DIAGNOSIS — Z79899 Other long term (current) drug therapy: Secondary | ICD-10-CM | POA: Insufficient documentation

## 2023-04-25 DIAGNOSIS — I5032 Chronic diastolic (congestive) heart failure: Secondary | ICD-10-CM | POA: Diagnosis not present

## 2023-04-25 DIAGNOSIS — A498 Other bacterial infections of unspecified site: Secondary | ICD-10-CM | POA: Diagnosis not present

## 2023-04-25 LAB — BASIC METABOLIC PANEL
Anion gap: 10 (ref 5–15)
BUN: 11 mg/dL (ref 8–23)
CO2: 25 mmol/L (ref 22–32)
Calcium: 8.4 mg/dL — ABNORMAL LOW (ref 8.9–10.3)
Chloride: 104 mmol/L (ref 98–111)
Creatinine, Ser: 1.51 mg/dL — ABNORMAL HIGH (ref 0.61–1.24)
GFR, Estimated: 50 mL/min — ABNORMAL LOW (ref >60)
Glucose, Bld: 96 mg/dL (ref 70–99)
Potassium: 4.2 mmol/L (ref 3.5–5.1)
Sodium: 139 mmol/L (ref 135–145)

## 2023-04-26 DIAGNOSIS — A498 Other bacterial infections of unspecified site: Secondary | ICD-10-CM | POA: Diagnosis not present

## 2023-04-27 ENCOUNTER — Other Ambulatory Visit: Payer: Self-pay | Admitting: Radiation Oncology

## 2023-04-27 ENCOUNTER — Inpatient Hospital Stay
Admission: RE | Admit: 2023-04-27 | Discharge: 2023-04-27 | Disposition: A | Discharge status: Home / Self Care | Payer: Self-pay | Source: Ambulatory Visit | Attending: Radiation Oncology | Admitting: Radiation Oncology

## 2023-04-27 DIAGNOSIS — C679 Malignant neoplasm of bladder, unspecified: Secondary | ICD-10-CM

## 2023-04-27 DIAGNOSIS — A498 Other bacterial infections of unspecified site: Secondary | ICD-10-CM | POA: Diagnosis not present

## 2023-04-28 ENCOUNTER — Other Ambulatory Visit: Payer: Self-pay

## 2023-04-28 DIAGNOSIS — A498 Other bacterial infections of unspecified site: Secondary | ICD-10-CM | POA: Diagnosis not present

## 2023-04-28 DIAGNOSIS — C679 Malignant neoplasm of bladder, unspecified: Secondary | ICD-10-CM | POA: Diagnosis not present

## 2023-04-29 DIAGNOSIS — A498 Other bacterial infections of unspecified site: Secondary | ICD-10-CM | POA: Diagnosis not present

## 2023-05-02 ENCOUNTER — Encounter: Payer: Self-pay | Admitting: Family Medicine

## 2023-05-02 ENCOUNTER — Ambulatory Visit: Payer: PPO | Admitting: Family Medicine

## 2023-05-02 VITALS — BP 132/70 | HR 64 | Temp 98.7°F | Wt 251.0 lb

## 2023-05-02 DIAGNOSIS — R7881 Bacteremia: Secondary | ICD-10-CM

## 2023-05-02 DIAGNOSIS — C679 Malignant neoplasm of bladder, unspecified: Secondary | ICD-10-CM | POA: Diagnosis not present

## 2023-05-02 DIAGNOSIS — N179 Acute kidney failure, unspecified: Secondary | ICD-10-CM

## 2023-05-02 DIAGNOSIS — A498 Other bacterial infections of unspecified site: Secondary | ICD-10-CM | POA: Diagnosis not present

## 2023-05-02 NOTE — Progress Notes (Signed)
   Subjective:    Patient ID: CHRISTY FRIEDE, male    DOB: [birth year], 68 y.o.   MRN: [id number]  HPI Here to follow up on a hospital stay from 04-13-23 to 04-18-23 for sepsis and AKI. He presented with extreme fatigue, and he was found to have bacteremia. His WBC on admission was normal at 5.3, and he did not have a fever. However his creatinine on admission was 2.42, and he was quite dehydrated. Blood cultures grew Enterococcus faecalis, and Infectious Disease was consulted. He was treated with a series of IV fluids and antibiotics with a prompt improvement. By the time of DC, his creatinine had improved to 0.95. His Hgb on DC was 7.8, but this was back up to his baseline of 8.8 on DC. He was sent home with a PICC line and he has been receiving IV Ampicillin  since then. He feels better today with more energy, although he is still somewhat fatigued. His appetite is coming back. He is drinking lots of water . Of note he had a total cystectomy with prostatectomy per Dr. Alvaro on 03-03-23, and an ileal conduit was fashioned. He has been wearing this with a bag since then.    Review of Systems  Constitutional:  Positive for fatigue. Negative for fever.  Respiratory: Negative.    Cardiovascular: Negative.   Gastrointestinal: Negative.   Genitourinary:  Negative for dysuria and flank pain.       Objective:   Physical Exam Constitutional:      Appearance: Normal appearance.  Cardiovascular:     Rate and Rhythm: Normal rate and regular rhythm.     Pulses: Normal pulses.     Heart sounds: Normal heart sounds.     Comments: PICC line is in the right upper arm Pulmonary:     Effort: Pulmonary effort is normal.     Breath sounds: Normal breath sounds.  Abdominal:     General: Abdomen is flat. Bowel sounds are normal.     Palpations: Abdomen is soft.  Neurological:     Mental Status: He is alert.           Assessment & Plan:  He is recovering from an Enterococcal sepsis which caused AKI.  His renal function has returned to his baseline now. He will continue to drink plentyu of water  daily. He will see the ID clinic on 05-03-22, and hopefully he can have the PICC line removed at that date. He will follow up with Dr. Alvaro sometime soon. We spent a total of (35   ) minutes reviewing records and discussing these issues.  Garnette Olmsted, MD

## 2023-05-04 ENCOUNTER — Ambulatory Visit: Payer: PPO | Admitting: Internal Medicine

## 2023-05-04 ENCOUNTER — Other Ambulatory Visit: Payer: Self-pay

## 2023-05-04 ENCOUNTER — Telehealth: Payer: Self-pay

## 2023-05-04 VITALS — BP 142/86 | HR 65 | Temp 97.5°F | Wt 254.0 lb

## 2023-05-04 DIAGNOSIS — A498 Other bacterial infections of unspecified site: Secondary | ICD-10-CM | POA: Diagnosis not present

## 2023-05-04 DIAGNOSIS — R7881 Bacteremia: Secondary | ICD-10-CM

## 2023-05-04 NOTE — Progress Notes (Signed)
 Patient Active Problem List   Diagnosis Date Noted   History of bladder cancer 04/18/2023   Sepsis (HCC) 04/18/2023   AKI (acute kidney injury) (HCC) 04/18/2023   History of aortic valve replacement with tissue graft 04/15/2023   Carrier of multidrug-resistant Stenotrophomonas maltophilia 04/15/2023   Enterococcus faecalis infection 04/14/2023   Bacteremia 04/14/2023   History of prosthetic aortic valve 04/14/2023   Shock (HCC) 04/13/2023   Bladder cancer (HCC) 07/29/2022   S/P pericardial window creation 09/30/2021   Pericardial effusion 09/28/2021   Syncope    PAF (paroxysmal atrial fibrillation) (HCC)    S/P AVR 09/16/2021   Morbid obesity (HCC) 02/29/2016   Essential hypertension    Ascending aortic aneurysm (HCC)    Rhinitis, chronic 01/14/2015   Generalized anxiety disorder 01/14/2015   Aortic stenosis, moderate 01/02/2015   Chronic diastolic CHF (congestive heart failure) (HCC) 06/30/2014   OSA (obstructive sleep apnea) 02/13/2012   Fatigue 01/09/2012   Gall bladder disease 06/16/2011   Hyperlipidemia 02/28/2011   Angina of effort (HCC) 12/08/2010   CAD (coronary artery disease) 12/08/2010   Tobacco abuse 12/08/2010   Aortic stenosis 12/08/2010   ANAL FISSURE 04/23/2009   Internal hemorrhoids 05/22/2008   COLONIC POLYPS 02/07/2008   CAROTID ARTERY STENOSIS 02/07/2008    Patient's Medications  New Prescriptions   No medications on file  Previous Medications   ACETAMINOPHEN  (TYLENOL ) 325 MG TABLET    Take 650 mg by mouth every 6 (six) hours as needed (for pain).   AMPICILLIN  IVPB    Inject 12 g into the vein daily for 25 days. As a continuous infusion. Indication:  E faecalis bacteremia First Dose: Yes Last Day of Therapy:  05/12/23  Labs - Once weekly:  CBC/D and BMP, Labs - Once weekly: ESR and CRP Method of administration: Ambulatory Pump (Continuous Infusion) Method of administration may be changed at the discretion of home infusion pharmacist  based upon assessment of the patient and/or caregiver's ability to self-administer the medication ordered.   ASPIRIN  EC 81 MG TABLET    Take 81 mg by mouth in the morning.   CARVEDILOL  (COREG ) 3.125 MG TABLET    TAKE 1 TABLET BY MOUTH TWICE A DAY WITH FOOD   DIAZEPAM  (VALIUM ) 5 MG TABLET    TAKE 1 TABLET BY MOUTH EVERY 12 HOURS AS NEEDED FOR ANXIETY.   FERROUS SULFATE  325 (65 FE) MG TABLET    Take 325 mg by mouth daily with breakfast.   FUROSEMIDE  (LASIX ) 20 MG TABLET    Take 2 tablets (40 mg total) by mouth 2 (two) times daily.   IPRATROPIUM (ATROVENT ) 0.03 % NASAL SPRAY    Place 2 sprays into both nostrils every 12 (twelve) hours as needed for rhinitis.   MAGNESIUM  500 MG TABS    Take 500 mg by mouth in the morning.   MECLIZINE  (ANTIVERT ) 25 MG TABLET    Take 1 tablet (25 mg total) by mouth 3 (three) times daily as needed for dizziness.   MULTIVITAMIN-IRON-MINERALS-FOLIC ACID  (CENTRUM) CHEWABLE TABLET    Chew 1 tablet by mouth daily.   NITROGLYCERIN  (NITROSTAT ) 0.4 MG SL TABLET    Place 0.4 mg under the tongue every 5 (five) minutes as needed for chest pain.   OXYCODONE  (ROXICODONE ) 5 MG IMMEDIATE RELEASE TABLET    Take 1 tablet (5 mg total) by mouth every 6 (six) hours as needed for breakthrough pain (post-operatively).   POTASSIUM CHLORIDE  SA (KLOR-CON  M20)  20 MEQ TABLET    Take 2 tablets (40 mEq total) by mouth daily.   ROSUVASTATIN  (CRESTOR ) 20 MG TABLET    TAKE 1 TABLET BY MOUTH EVERYDAY AT BEDTIME   VENLAFAXINE  XR (EFFEXOR -XR) 150 MG 24 HR CAPSULE    TAKE 1 CAPSULE BY MOUTH DAILY WITH BREAKFAST.  Modified Medications   No medications on file  Discontinued Medications   No medications on file    Subjective: 68 year old male with history of AVR, aortic graft placement, bladder cancer as per TRU BT chemotherapy, recent ileal conduit plans for further chemotherapy presents for hospital follow-up of E faecalis bacteremia.  Source of E faecalis bacteremia seems to be unclear.  He underwent  TTE and TEE given prosthetic valve and aortic allograft which did not show vegetation.  CT angiogram was negative for aortic graft infection.  Discharged on ampicillin  x 4 weeks EOT 05/12/2023.  Today 05/04/2023 no new complaints.  Tolerating antibiotics.    Review of Systems: Review of Systems  All other systems reviewed and are negative.   Past Medical History:  Diagnosis Date   Anxiety    takes Valium  as needed   Aortic stenosis, moderate    Arthritis    Ascending aortic aneurysm (HCC)    CAD (coronary artery disease)    a. s/p PCI of the RCA 8/12 with DES by Dr Wonda, preserved EF. b. LHC/RHC (2/16) with mean RA 12, PA 32/15, mean PCWP 18, CI 3.47; patent mid and distal RCA stents, 50-60% proximal stenosis small PDA.      Cancer [facility])    bladder   Carotid stenosis    a. Carotid US  (05/2013):  Bilateral 1-39% ICA; L thyroid  nodule (prior hx of aspiration).   Chronic diastolic CHF (congestive heart failure) (HCC)    Complication of anesthesia    difficulty waking up after gallbladder surgery   Depression    Dyspnea    Essential hypertension    GERD (gastroesophageal reflux disease)    if needed will take OTC meds    Heart murmur    History of colonic polyps    hyperplastic   Hyperlipidemia    Joint pain    Lesion of bladder    Myocardial infarction (HCC) 2012   Obesity (BMI 30-39.9) 02/29/2016   Pre-diabetes    Restless leg    Sleep apnea    uses cpap   Tubular adenoma of colon    Vertigo    takes Meclizine  as needed    Social History   Tobacco Use   Smoking status: Former    Current packs/day: 0.00    Average packs/day: 2.0 packs/day for 40.0 years (80.0 ttl pk-yrs)    Types: Cigarettes    Start date: 64    Quit date: 2012    Years since quitting: 13.0   Smokeless tobacco: Never  Vaping Use   Vaping status: Never Used  Substance Use Topics   Alcohol use: No    Alcohol/week: 0.0 standard drinks of alcohol   Drug use: No    Family History  Problem  Relation Age of Onset   Lung cancer Mother        lung   Esophageal cancer Cousin    Colon cancer Neg Hx    Rectal cancer Neg Hx    Stomach cancer Neg Hx     No Known Allergies  Health Maintenance  Topic Date Due   Diabetic kidney evaluation - Urine ACR  Never done   Hepatitis C Screening  Never done   DTaP/Tdap/Td (1 - Tdap) Never done   Zoster Vaccines- Shingrix (1 of 2) Never done   COVID-19 Vaccine (4 - 2024-25 season) 12/25/2022   Medicare Annual Wellness (AWV)  11/03/2023   Lung Cancer Screening  04/17/2024   Diabetic kidney evaluation - eGFR measurement  04/24/2024   Colonoscopy  10/03/2025   Pneumonia Vaccine 70+ Years old  Completed   INFLUENZA VACCINE  Completed   HPV VACCINES  Aged Out    Objective:  Vitals:   05/04/23 1359  BP: (!) 158/91  Pulse: 65  Temp: (!) 97.5 F (36.4 C)  TempSrc: Oral  SpO2: 98%  Weight: 254 lb (115.2 kg)   Body mass index is 41 kg/m.  Physical Exam Constitutional:      General: He is not in acute distress.    Appearance: He is normal weight. He is not toxic-appearing.  HENT:     Head: Normocephalic and atraumatic.     Right Ear: External ear normal.     Left Ear: External ear normal.     Nose: No congestion or rhinorrhea.     Mouth/Throat:     Mouth: Mucous membranes are moist.     Pharynx: Oropharynx is clear.  Eyes:     Extraocular Movements: Extraocular movements intact.     Conjunctiva/sclera: Conjunctivae normal.     Pupils: Pupils are equal, round, and reactive to light.  Cardiovascular:     Rate and Rhythm: Normal rate and regular rhythm.     Heart sounds: No murmur heard.    No friction rub. No gallop.  Pulmonary:     Effort: Pulmonary effort is normal.     Breath sounds: Normal breath sounds.  Abdominal:     General: Abdomen is flat. Bowel sounds are normal.     Palpations: Abdomen is soft.  Musculoskeletal:        General: No swelling. Normal range of motion.     Cervical back: Normal range of motion  and neck supple.  Skin:    General: Skin is warm and dry.  Neurological:     General: No focal deficit present.     Mental Status: He is oriented to person, place, and time.  Psychiatric:        Mood and Affect: Mood normal.    Lab Results Lab Results  Component Value Date   WBC 6.0 04/20/2023   HGB 8.8 (L) 04/20/2023   HCT 28.6 (L) 04/20/2023   MCV 86.7 04/20/2023   PLT 204 04/20/2023    Lab Results  Component Value Date   CREATININE 1.51 (H) 04/25/2023   BUN 11 04/25/2023   NA 139 04/25/2023   K 4.2 04/25/2023   CL 104 04/25/2023   CO2 25 04/25/2023    Lab Results  Component Value Date   ALT 30 04/20/2023   AST 24 04/20/2023   ALKPHOS 103 04/20/2023   BILITOT 0.4 04/20/2023    Lab Results  Component Value Date   CHOL 155 01/17/2022   HDL 57 01/17/2022   LDLCALC 71 01/17/2022   LDLDIRECT 145.6 03/16/2007   TRIG 136 01/17/2022   CHOLHDL 2.7 01/17/2022   No results found for: LABRPR, RPRTITER No results found for: HIV1RNAQUANT, HIV1RNAVL, CD4TABS   Problem List Items Addressed This Visit   None  Results          Assessment/Plan 67 YM with Hx of bladder Ca, SP TURBT and complete of 8 cycles of pembrolizumab  (03/03/23) presents for hospital  ff/u of: #E faecalis bacteremia 2/2 unclear source #Medication management #PICC -1/3 wbc 6, scr 1.96 -Amp x 4 weeks eot 05/12/23, then pull picc.  No issues with PICC, tolerating abx -F/U with ID in one month   #AKI #CHF -Cscr trending up 0.95 on 12/24->1.96. Pt restarted lasix  at lower dose 40 mg po bid following hospitalization. He was taking 60mg  PO bid. Discussed with Dr. Mcelan increase lasix  to 60mg  po bid -F/U with cards in the next 1-2 weeks(discussed with pt)  Loney Stank, MD [facility] 05/04/2023, 2:15 PM   I have personally spent 42 minutes involved in face-to-face and non-face-to-face activities for this patient on the day of the visit.  Professional time spent includes the following activities: Preparing to see the patient (review of tests), Obtaining and/or reviewing separately obtained history (admission/discharge record), Performing a medically appropriate examination and/or evaluation , Ordering medications/tests/procedures, referring and communicating with other health care professionals, Documenting clinical information in the EMR, Independently interpreting results (not separately reported), Communicating results to the patient/family/caregiver, Counseling and educating the patient/family/caregiver and Care coordination (not separately reported).

## 2023-05-04 NOTE — Telephone Encounter (Signed)
 Per Dr. Dennise pts picc line can be pulled after last dose 1/17. Ordes sent to [employer] team via [employer]. PT verbalized concern regarding HH not doing weekly visits since discharge. Requested [employer] follow up with this and ensure patient picc is pulled as ordered. Lorenda CHRISTELLA Code, RMA

## 2023-05-04 NOTE — Patient Instructions (Signed)
 Increase lasix to 60mg  po bid, f/u with cardiology Pull picc after last dose on 1/17 F?U with ID in one month

## 2023-05-05 ENCOUNTER — Other Ambulatory Visit: Payer: Self-pay

## 2023-05-05 DIAGNOSIS — C679 Malignant neoplasm of bladder, unspecified: Secondary | ICD-10-CM | POA: Diagnosis not present

## 2023-05-05 DIAGNOSIS — A498 Other bacterial infections of unspecified site: Secondary | ICD-10-CM | POA: Diagnosis not present

## 2023-05-05 DIAGNOSIS — I2510 Atherosclerotic heart disease of native coronary artery without angina pectoris: Secondary | ICD-10-CM | POA: Diagnosis not present

## 2023-05-05 DIAGNOSIS — I5032 Chronic diastolic (congestive) heart failure: Secondary | ICD-10-CM | POA: Diagnosis not present

## 2023-05-05 DIAGNOSIS — I110 Hypertensive heart disease with heart failure: Secondary | ICD-10-CM | POA: Diagnosis not present

## 2023-05-06 DIAGNOSIS — A498 Other bacterial infections of unspecified site: Secondary | ICD-10-CM | POA: Diagnosis not present

## 2023-05-07 ENCOUNTER — Other Ambulatory Visit (HOSPITAL_COMMUNITY): Payer: Self-pay | Admitting: Cardiology

## 2023-05-07 DIAGNOSIS — I5032 Chronic diastolic (congestive) heart failure: Secondary | ICD-10-CM

## 2023-05-07 DIAGNOSIS — Z79899 Other long term (current) drug therapy: Secondary | ICD-10-CM

## 2023-05-08 ENCOUNTER — Other Ambulatory Visit: Payer: Self-pay | Admitting: Family Medicine

## 2023-05-08 DIAGNOSIS — F411 Generalized anxiety disorder: Secondary | ICD-10-CM

## 2023-05-08 NOTE — Progress Notes (Addendum)
 GU Location of Tumor / Histology: Bladder Ca  04/13/2023 Dr.Michael Penna CT Abdomen Pelvis without Contrast CLINICAL DATA:  Status post cystectomy on 03/03/2023 with pain and fever, initial encounter.   IMPRESSION: Changes consistent with history of prior cystectomy and prostatectomy with ileal conduit formation in the right mid abdomen. Mild fullness of the collecting systems is noted bilaterally. A right-sided nephroureteral stent is noted extending into the ostomy bag.  No evidence of postoperative abscess or extravasation is identified.  No acute abnormality is seen.   Past/Anticipated interventions by urology, if any:   Dr. Ricardo Likens Assessment Luis [name]:   Keep remaining Rt bander stent. We will remove in outpatinet elective setting under ampicillin  coverage.   Again greatly appreciate hospitalist, ID, cardiology teams aggressive management.  Certainly OK for DC from from GU perspective.      Past/Anticipated interventions by medical oncology, if any:  04/20/2023 Dr. Norleen Kidney    Weight changes, if any:  Some weight loss since surgical procedure (bladder removal)  Bowel/Bladder complaints, if any:   Urinary bag and  no bowel complaints.  Nausea/Vomiting, if any:  No  Pain issues, if any:  0/10  SAFETY ISSUES: Prior radiation?  No Pacemaker/ICD? No Possible current pregnancy? Male Is the patient on methotrexate? No  Current Complaints / other details:

## 2023-05-09 ENCOUNTER — Ambulatory Visit
Admission: RE | Admit: 2023-05-09 | Discharge: 2023-05-09 | Disposition: A | Discharge status: Home / Self Care | Payer: PPO | Source: Ambulatory Visit | Attending: Radiation Oncology | Admitting: Radiation Oncology

## 2023-05-09 ENCOUNTER — Encounter: Payer: Self-pay | Admitting: Radiation Oncology

## 2023-05-09 ENCOUNTER — Ambulatory Visit: Payer: PPO

## 2023-05-09 ENCOUNTER — Ambulatory Visit: Payer: PPO | Admitting: Radiation Oncology

## 2023-05-09 VITALS — BP 184/83 | HR 70 | Temp 97.7°F | Resp 20 | Ht 66.0 in | Wt 257.2 lb

## 2023-05-09 DIAGNOSIS — I110 Hypertensive heart disease with heart failure: Secondary | ICD-10-CM | POA: Diagnosis not present

## 2023-05-09 DIAGNOSIS — C678 Malignant neoplasm of overlapping sites of bladder: Secondary | ICD-10-CM | POA: Diagnosis not present

## 2023-05-09 DIAGNOSIS — C679 Malignant neoplasm of bladder, unspecified: Secondary | ICD-10-CM | POA: Diagnosis not present

## 2023-05-09 DIAGNOSIS — I2510 Atherosclerotic heart disease of native coronary artery without angina pectoris: Secondary | ICD-10-CM | POA: Diagnosis not present

## 2023-05-09 DIAGNOSIS — A498 Other bacterial infections of unspecified site: Secondary | ICD-10-CM | POA: Diagnosis not present

## 2023-05-09 NOTE — Progress Notes (Signed)
 Radiation Oncology         [phone] ________________________________  Initial outpatient Consultation  Name: Luis [name] MRN: [id number]  Date of Service: 05/09/2023 DOB: 05-08-1955  CC:Fry, Garnette LABOR, MD  Federico Norleen ONEIDA MADISON, MD   REFERRING PHYSICIAN: Federico Norleen ONEIDA MADISON, MD  DIAGNOSIS: 68 y/o man with locally advanced, pT4aN1, high grade invasive urothelial cell carcinoma of the bladder, s/p cystoprostatectomy 03/03/2023    ICD-10-CM   1. Malignant neoplasm of overlapping sites of bladder [facility])  C67.8       HISTORY OF PRESENT ILLNESS: Luis [name] is a 68 y.o. male seen at the request of Dr. Federico. He is an established urology patient who initially presented with gross hematuria back in 2021. He was found to have a bladder tumor and underwent TURBT with gemcitabine  instillation on 10/21/19 under the care of Dr. Ottelin. Pathology showed high grade, nonmuscle invasive, papillary urothelial carcinoma.  He completed an initial course of induction BCG treatments x 6 in September 2021 and his initial posttreatment cystoscopy showed CIS only so he continued in observation.  A surveillance cystoscopy in 09/2020 confirmed a recurrence in the left bladder neck so he proceeded with repeat TURBT on 10/14/2020 with pathology confirming recurrent high-grade nonmuscle invasive urothelial carcinoma.  He completed another 6 cycles of BCG in November 2022 and a surveillance in office cystoscopy in March 2023 was negative. He unfortunately developed a second recurrence involving the prostatic urethra, on repeat TURBT in 05/2022, still non-muscle invasive. He was subsequently referred to Dr. Federico on 07/29/22 to discuss potential systemic treatment options for BCG refractory urothelial carcinoma. He subsequently completed 8 cycles of pembrolizumab  from 08/12/22 through 01/13/23.  Surveillance cystoscopy in August 2024 showed further progression and a CT A/P on 12/21/2022 showed enhancing nodular soft tissue and  wall thickening along the left bladder dome with small lower abdominal and retroperitoneal lymph nodes, increased as compared to his prior scan from 09/28/2021, despite immunotherapy, so he ultimately proceeded to definitive cystoprostatectomy on 03/03/23 under the care of Dr. Alvaro. Unfortunately, pathology showed a 5.8 cm, pT4aN1, infiltrative high-grade urothelial carcinoma, involving the bladder dome, anterior wall, and the prostatic stroma, invading directly into the stroma at the apex and mid portions of the prostate gland. Additionally, one left common iliac lymph node was positive for metastatic urothelial carcinoma (1/2). Resection margins were positive for urothelial carcinoma in situ (CIS) at the apex urethral and right ureter.  All other margins of resection were negative for carcinoma.   He was hospitalized from 04/13/23 through 04/18/23 due to postoperative urosepsis and remains on IV antibiotics at home until 05/12/2023.  He had a follow-up visit with Dr. Federico on 04/20/2023 who recommended adjuvant chemoradiation, using the same which method.  He is planning to start systemic therapy after completion of the IV antibiotics and has been kindly referred to us  today to discuss the potential role for radiotherapy.  PREVIOUS RADIATION THERAPY: No  PAST MEDICAL HISTORY:  Past Medical History:  Diagnosis Date   Anxiety    takes Valium  as needed   Aortic stenosis, moderate    Arthritis    Ascending aortic aneurysm (HCC)    CAD (coronary artery disease)    a. s/p PCI of the RCA 8/12 with DES by Dr Wonda, preserved EF. b. LHC/RHC (2/16) with mean RA 12, PA 32/15, mean PCWP 18, CI 3.47; patent mid and distal RCA stents, 50-60% proximal stenosis small PDA.      Cancer (HCC)  bladder   Carotid stenosis    a. Carotid US  (05/2013):  Bilateral 1-39% ICA; L thyroid  nodule (prior hx of aspiration).   Chronic diastolic CHF (congestive heart failure) (HCC)    Complication of anesthesia    difficulty  waking up after gallbladder surgery   Depression    Dyspnea    Essential hypertension    GERD (gastroesophageal reflux disease)    if needed will take OTC meds    Heart murmur    History of colonic polyps    hyperplastic   Hyperlipidemia    Joint pain    Lesion of bladder    Myocardial infarction (HCC) 2012   Obesity (BMI 30-39.9) 02/29/2016   Pre-diabetes    Restless leg    Sleep apnea    uses cpap   Tubular adenoma of colon    Vertigo    takes Meclizine  as needed      PAST SURGICAL HISTORY: Past Surgical History:  Procedure Laterality Date   AORTIC VALVE REPLACEMENT N/A 09/16/2021   Procedure: AORTIC VALVE REPLACEMENT (AVR);  Surgeon: Lucas Dorise POUR, MD;  Location: [facility] OR;  Service: Open Heart Surgery;  Laterality: N/A;   CATARACT EXTRACTION   4 YRS AGO   BOTH EYES   CHOLECYSTECTOMY  07/21/2011   Procedure: LAPAROSCOPIC CHOLECYSTECTOMY WITH INTRAOPERATIVE CHOLANGIOGRAM;  Surgeon: Alm VEAR Angle, MD;  Location: [facility] ORS;  Service: General;  Laterality: N/A;   CORONARY ANGIOPLASTY  2012   2 stents   coronary stenting     s/p PCI of the RCA by Dr Wonda 8/12 with 2 promus stents   CYSTOSCOPY W/ RETROGRADES Bilateral 12/13/2019   Procedure: CYSTOSCOPY WITH RETROGRADE PYELOGRAM;  Surgeon: Alvaro Hummer, MD;  Location: [facility];  Service: Urology;  Laterality: Bilateral;   CYSTOSCOPY W/ RETROGRADES Bilateral 10/14/2020   Procedure: CYSTOSCOPY WITH RETROGRADE PYELOGRAM;  Surgeon: Alvaro Hummer, MD;  Location: [facility];  Service: Urology;  Laterality: Bilateral;   CYSTOSCOPY W/ RETROGRADES Bilateral 12/16/2020   Procedure: CYSTOSCOPY WITH RETROGRADE PYELOGRAM;  Surgeon: Alvaro Hummer, MD;  Location: [facility];  Service: Urology;  Laterality: Bilateral;   CYSTOSCOPY W/ RETROGRADES Bilateral 06/03/2022   Procedure: CYSTOSCOPY WITH RETROGRADE PYELOGRAM;  Surgeon: Alvaro Hummer, MD;  Location: [facility];  Service: Urology;  Laterality:  Bilateral;   CYSTOSCOPY W/ RETROGRADES Bilateral 12/28/2022   Procedure: CYSTOSCOPY WITH RETROGRADE PYELOGRAM, FULGARATION OF BLEEDERS;  Surgeon: Alvaro Hummer KATHEE Mickey., MD;  Location: [facility];  Service: Urology;  Laterality: Bilateral;   CYSTOSCOPY WITH INJECTION N/A 03/03/2023   Procedure: CYSTOSCOPY WITH INDOCYANINE INJECTION;  Surgeon: Alvaro Hummer KATHEE Mickey., MD;  Location: [facility];  Service: Urology;  Laterality: N/A;  360 MINUTES   LEFT AND RIGHT HEART CATHETERIZATION WITH CORONARY ANGIOGRAM N/A 06/23/2014   Procedure: LEFT AND RIGHT HEART CATHETERIZATION WITH CORONARY ANGIOGRAM;  Surgeon: Ezra GORMAN Shuck, MD;  Location: [facility] CATH LAB;  Service: Cardiovascular;  Laterality: N/A;   NECK SURGERY  03/23/09   per Dr. Barbarann, cervical fusion    PERICARDIOCENTESIS N/A 09/28/2021   Procedure: PERICARDIOCENTESIS;  Surgeon: Dann Candyce GORMAN, MD;  Location: [facility] INVASIVE CV LAB;  Service: Cardiovascular;  Laterality: N/A;   REPLACEMENT ASCENDING AORTA N/A 09/16/2021   Procedure: REPLACEMENT ASCENDING AORTA WITH 30 X HEMASHIELD PLATINUM WOVEN DOUBLE VELOUR VASCULAR GRAFT;  Surgeon: Lucas Dorise POUR, MD;  Location: [facility] OR;  Service: Open Heart Surgery;  Laterality: N/A;  CIRC ARREST   right elbow surgery  RIGHT HEART CATH AND CORONARY ANGIOGRAPHY N/A 07/08/2021   Procedure: RIGHT HEART CATH AND CORONARY ANGIOGRAPHY;  Surgeon: Rolan Ezra RAMAN, MD;  Location: [facility] INVASIVE CV LAB;  Service: Cardiovascular;  Laterality: N/A;   ROBOT ASSISTED LAPAROSCOPIC COMPLETE CYSTECT ILEAL CONDUIT N/A 03/03/2023   Procedure: XI ROBOTIC ASSISTED LAPAROSCOPIC COMPLETE CYSTECTECTOMY WITH  ILEAL CONDUIT DIVERSION;  Surgeon: Alvaro Ricardo KATHEE Mickey., MD;  Location: [facility];  Service: Urology;  Laterality: N/A;   ROBOT ASSISTED LAPAROSCOPIC RADICAL PROSTATECTOMY N/A 03/03/2023   Procedure: XI ROBOTIC ASSISTED LAPAROSCOPIC RADICAL PROSTATECTOMY WITH LYMPH NODE DISSECTION;  Surgeon: Alvaro Ricardo KATHEE Mickey., MD;  Location: [facility];  Service:  Urology;  Laterality: N/A;   solonscopy  05/23/08   per Dr. Jakie inch hemorrhoids only, repeat in 5 years   SUBXYPHOID PERICARDIAL WINDOW N/A 09/28/2021   Procedure: SUBXYPHOID PERICARDIAL WINDOW;  Surgeon: Lucas Dorise POUR, MD;  Location: [facility] OR;  Service: Thoracic;  Laterality: N/A;   TEE WITHOUT CARDIOVERSION N/A 06/23/2014   Procedure: TRANSESOPHAGEAL ECHOCARDIOGRAM (TEE);  Surgeon: Ezra RAMAN Rolan, MD;  Location: [facility] ENDOSCOPY;  Service: Cardiovascular;  Laterality: N/A;   TEE WITHOUT CARDIOVERSION N/A 01/21/2016   Procedure: TRANSESOPHAGEAL ECHOCARDIOGRAM (TEE);  Surgeon: Ezra RAMAN Rolan, MD;  Location: [facility] ENDOSCOPY;  Service: Cardiovascular;  Laterality: N/A;   TEE WITHOUT CARDIOVERSION N/A 09/16/2021   Procedure: TRANSESOPHAGEAL ECHOCARDIOGRAM (TEE);  Surgeon: Lucas Dorise POUR, MD;  Location: [facility] OR;  Service: Open Heart Surgery;  Laterality: N/A;   TEE WITHOUT CARDIOVERSION N/A 09/28/2021   Procedure: TRANSESOPHAGEAL ECHOCARDIOGRAM (TEE);  Surgeon: Lucas Dorise POUR, MD;  Location: [facility] OR;  Service: Thoracic;  Laterality: N/A;   TONSILLECTOMY     TRANSESOPHAGEAL ECHOCARDIOGRAM (CATH LAB) N/A 04/17/2023   Procedure: TRANSESOPHAGEAL ECHOCARDIOGRAM;  Surgeon: Jeffrie Oneil BROCKS, MD;  Location: [facility] INVASIVE CV LAB;  Service: Cardiovascular;  Laterality: N/A;   TRANSURETHRAL RESECTION OF BLADDER TUMOR N/A 10/21/2019   Procedure: TRANSURETHRAL RESECTION OF BLADDER TUMOR (TURBT);  Surgeon: Ottelin, Mark, MD;  Location: [facility];  Service: Urology;  Laterality: N/A;   TRANSURETHRAL RESECTION OF BLADDER TUMOR N/A 12/13/2019   Procedure: TRANSURETHRAL RESECTION OF BLADDER TUMOR (TURBT);  Surgeon: Alvaro Ricardo, MD;  Location: [facility];  Service: Urology;  Laterality: N/A;  1 HR   TRANSURETHRAL RESECTION OF BLADDER TUMOR N/A 10/14/2020   Procedure: TRANSURETHRAL RESECTION OF BLADDER TUMOR (TURBT);  Surgeon: Alvaro Ricardo, MD;  Location: [facility];   Service: Urology;  Laterality: N/A;   TRANSURETHRAL RESECTION OF BLADDER TUMOR N/A 12/16/2020   Procedure: RESTAGING TRANSURETHRAL RESECTION OF BLADDER TUMOR (TURBT);  Surgeon: Alvaro Ricardo, MD;  Location: [facility];  Service: Urology;  Laterality: N/A;   TRANSURETHRAL RESECTION OF BLADDER TUMOR N/A 06/03/2022   Procedure: TRANSURETHRAL RESECTION OF BLADDER TUMOR (TURBT);  Surgeon: Alvaro Ricardo, MD;  Location: [facility];  Service: Urology;  Laterality: N/A;   UMBILICAL HERNIA REPAIR  03/03/2023   Procedure: HERNIA REPAIR UMBILICAL;  Surgeon: Alvaro Ricardo KATHEE Mickey., MD;  Location: [facility];  Service: Urology;;    FAMILY HISTORY:  Family History  Problem Relation Age of Onset   Lung cancer Mother        lung   Esophageal cancer Cousin    Colon cancer Neg Hx    Rectal cancer Neg Hx    Stomach cancer Neg Hx     SOCIAL HISTORY:  Social History   Socioeconomic History   Marital status: Married    Spouse name: Not on file  Number of children: 4   Years of education: Not on file   Highest education level: Not on file  Occupational History   Occupation: Public House Manager: [employer]  Tobacco Use   Smoking status: Former    Current packs/day: 0.00    Average packs/day: 2.0 packs/day for 40.0 years (80.0 ttl pk-yrs)    Types: Cigarettes    Start date: 47    Quit date: 2012    Years since quitting: 13.0   Smokeless tobacco: Never  Vaping Use   Vaping status: Never Used  Substance and Sexual Activity   Alcohol use: No    Alcohol/week: 0.0 standard drinks of alcohol   Drug use: No   Sexual activity: Not on file  Other Topics Concern   Not on file  Social History Narrative   Not on file   Social Drivers of Health   Financial Resource Strain: Low Risk  (11/03/2022)   Overall Financial Resource Strain (CARDIA)    Difficulty of Paying Living Expenses: Not hard at all  Food Insecurity: No Food Insecurity (04/20/2023)   Hunger Vital Sign     Worried About Running Out of Food in the Last Year: Never true    Ran Out of Food in the Last Year: Never true  Transportation Needs: No Transportation Needs (04/20/2023)   PRAPARE - Administrator, Civil Service (Medical): No    Lack of Transportation (Non-Medical): No  Physical Activity: Unknown (11/03/2022)   Exercise Vital Sign    Days of Exercise per Week: Patient unable to answer    Minutes of Exercise per Session: 30 min  Stress: No Stress Concern Present (11/03/2022)   [employer] of Occupational Health - Occupational Stress Questionnaire    Feeling of Stress : Not at all  Social Connections: Moderately Isolated (11/03/2022)   Social Connection and Isolation Panel [NHANES]    Frequency of Communication with Friends and Family: More than three times a week    Frequency of Social Gatherings with Friends and Family: More than three times a week    Attends Religious Services: Never    Database Administrator or Organizations: No    Attends Banker Meetings: Never    Marital Status: Married  Catering Manager Violence: Not At Risk (04/20/2023)   Humiliation, Afraid, Rape, and Kick questionnaire    Fear of Current or Ex-Partner: No    Emotionally Abused: No    Physically Abused: No    Sexually Abused: No    ALLERGIES: Patient has no known allergies.  MEDICATIONS:  Current Outpatient Medications  Medication Sig Dispense Refill   acetaminophen  (TYLENOL ) 325 MG tablet Take 650 mg by mouth every 6 (six) hours as needed (for pain).     ampicillin  IVPB Inject 12 g into the vein daily for 25 days. As a continuous infusion. Indication:  E faecalis bacteremia First Dose: Yes Last Day of Therapy:  05/12/23  Labs - Once weekly:  CBC/D and BMP, Labs - Once weekly: ESR and CRP Method of administration: Ambulatory Pump (Continuous Infusion) Method of administration may be changed at the discretion of home infusion pharmacist based upon assessment of the  patient and/or caregiver's ability to self-administer the medication ordered. 25 Units 0   aspirin  EC 81 MG tablet Take 81 mg by mouth in the morning.     [Paused] carvedilol  (COREG ) 3.125 MG tablet TAKE 1 TABLET BY MOUTH TWICE A DAY WITH FOOD (Patient not  taking: Reported on 05/04/2023) 180 tablet 1   diazepam  (VALIUM ) 5 MG tablet TAKE 1 TABLET BY MOUTH EVERY 12 HOURS AS NEEDED FOR ANXIETY. (Patient taking differently: Take 5 mg by mouth every 12 (twelve) hours as needed for anxiety.) 60 tablet 5   ferrous sulfate  325 (65 FE) MG tablet Take 325 mg by mouth daily with breakfast.     furosemide  (LASIX ) 20 MG tablet TAKE 2 TABLETS (40 MG TOTAL) BY MOUTH EVERY MORNING AND 1 TABLET (20 MG TOTAL) EVERY EVENING. 270 tablet 1   ipratropium (ATROVENT ) 0.03 % nasal spray Place 2 sprays into both nostrils every 12 (twelve) hours as needed for rhinitis.     Magnesium  500 MG TABS Take 500 mg by mouth in the morning.     meclizine  (ANTIVERT ) 25 MG tablet Take 1 tablet (25 mg total) by mouth 3 (three) times daily as needed for dizziness. 30 tablet 0   multivitamin-iron-minerals-folic acid  (CENTRUM) chewable tablet Chew 1 tablet by mouth daily.     nitroGLYCERIN  (NITROSTAT ) 0.4 MG SL tablet Place 0.4 mg under the tongue every 5 (five) minutes as needed for chest pain.     oxyCODONE  (ROXICODONE ) 5 MG immediate release tablet Take 1 tablet (5 mg total) by mouth every 6 (six) hours as needed for breakthrough pain (post-operatively). (Patient not taking: Reported on 05/04/2023) 20 tablet 0   potassium chloride  SA (KLOR-CON  M20) 20 MEQ tablet Take 2 tablets (40 mEq total) by mouth daily. (Patient taking differently: Take 20 mEq by mouth 2 (two) times daily.) 180 tablet 3   rosuvastatin  (CRESTOR ) 20 MG tablet TAKE 1 TABLET BY MOUTH EVERYDAY AT BEDTIME (Patient taking differently: Take 20 mg by mouth at bedtime.) 90 tablet 3   venlafaxine  XR (EFFEXOR -XR) 150 MG 24 hr capsule TAKE 1 CAPSULE BY MOUTH DAILY WITH BREAKFAST. 90  capsule 0   No current facility-administered medications for this encounter.    REVIEW OF SYSTEMS:  On review of systems, the patient reports that he is doing well overall. He denies any chest pain, shortness of breath, cough, fevers, chills, night sweats, or recent significant unintended weight loss. He denies any bowel or bladder disturbances, and denies abdominal pain, nausea or vomiting.  He is doing well, managing his new urostomy.  He denies any new musculoskeletal or joint aches or pains. A complete review of systems is obtained and is otherwise negative.    PHYSICAL EXAM:  Wt Readings from Last 3 Encounters:  05/09/23 257 lb 3.2 oz (116.7 kg)  05/04/23 254 lb (115.2 kg)  05/02/23 251 lb (113.9 kg)   Temp Readings from Last 3 Encounters:  05/09/23 97.7 F (36.5 C)  05/04/23 (!) 97.5 F (36.4 C) (Oral)  05/02/23 98.7 F (37.1 C) (Oral)   BP Readings from Last 3 Encounters:  05/09/23 (!) 184/83  05/04/23 (!) 142/86  05/02/23 132/70   Pulse Readings from Last 3 Encounters:  05/09/23 70  05/04/23 65  05/02/23 64    /10  In general this is a well appearing Caucasian man in no acute distress. He's alert and oriented x4 and appropriate throughout the examination. Cardiopulmonary assessment is negative for acute distress and he exhibits normal effort.     KPS = 100  100 - Normal; no complaints; no evidence of disease. 90   - Able to carry on normal activity; minor signs or symptoms of disease. 80   - Normal activity with effort; some signs or symptoms of disease. 70   - Cares  for self; unable to carry on normal activity or to do active work. 60   - Requires occasional assistance, but is able to care for most of his personal needs. 50   - Requires considerable assistance and frequent medical care. 40   - Disabled; requires special care and assistance. 30   - Severely disabled; hospital admission is indicated although death not imminent. 20   - Very sick; hospital  admission necessary; active supportive treatment necessary. 10   - Moribund; fatal processes progressing rapidly. 0     - Dead  Karnofsky DA, Abelmann WH, Craver LS and Burchenal JH [phone]) The use of the nitrogen mustards in the palliative treatment of carcinoma: with particular reference to bronchogenic carcinoma Cancer 1 [id number]  LABORATORY DATA:  Lab Results  Component Value Date   WBC 6.0 04/20/2023   HGB 8.8 (L) 04/20/2023   HCT 28.6 (L) 04/20/2023   MCV 86.7 04/20/2023   PLT 204 04/20/2023   Lab Results  Component Value Date   NA 139 04/25/2023   K 4.2 04/25/2023   CL 104 04/25/2023   CO2 25 04/25/2023   Lab Results  Component Value Date   ALT 30 04/20/2023   AST 24 04/20/2023   ALKPHOS 103 04/20/2023   BILITOT 0.4 04/20/2023     RADIOGRAPHY: CT ANGIO CHEST AORTA W/CM & OR WO/CM Result Date: 04/18/2023 CLINICAL DATA:  Graft versus host disease EXAM: CT ANGIOGRAPHY CHEST WITH CONTRAST TECHNIQUE: Multidetector CT imaging of the chest was performed using the standard protocol during bolus administration of intravenous contrast. Multiplanar CT image reconstructions and MIPs were obtained to evaluate the vascular anatomy. RADIATION DOSE REDUCTION: This exam was performed according to the departmental dose-optimization program which includes automated exposure control, adjustment of the mA and/or kV according to patient size and/or use of iterative reconstruction technique. CONTRAST:  OMNIPAQUE  IOHEXOL  350 MG/ML SOLN COMPARISON:  12/14/2022 FINDINGS: Cardiovascular: Preferential opacification of the thoracic aorta. Status post Bentall type aortic root graft repair with unchanged contour and caliber of both the graft and of the remaining portion of the vessel. No acute findings. Mild atherosclerosis. Normal heart size. Three-vessel coronary artery calcifications. No pericardial effusion. Right upper extremity PICC. Mediastinum/Nodes: No enlarged mediastinal, hilar, or axillary  lymph nodes. Thyroid  gland, trachea, and esophagus demonstrate no significant findings. Lungs/Pleura: Mild diffuse bilateral bronchial wall thickening. No pleural effusion or pneumothorax. Upper Abdomen: No acute abnormality. Hepatic steatosis. Status post cholecystectomy. Musculoskeletal: Status post median sternotomy. No acute osseous findings. Review of the MIP images confirms the above findings. IMPRESSION: 1. Status post Bentall type aortic root graft repair with unchanged contour and caliber of both the graft and the remaining portion of the vessel. No acute findings. 2. Mild diffuse bilateral bronchial wall thickening, consistent with nonspecific infectious or inflammatory bronchitis. 3. Coronary artery disease. 4. Hepatic steatosis. Electronically Signed   By: Marolyn JONETTA Jaksch M.D.   On: 04/18/2023 11:32   ECHO TEE Result Date: 04/17/2023    TRANSESOPHOGEAL ECHO REPORT   Patient Name:   THEOREN PALKA Date of Exam: 04/17/2023 Medical Rec #:  [id number]        Height:       66.0 in Accession #:    [id number]       Weight:       264.3 lb Date of Birth:  02-19-1956        BSA:          2.251 m Patient Age:    49  years         BP:           126/50 mmHg Patient Gender: M                HR:           55 bpm. Exam Location:  Inpatient Procedure: Transesophageal Echo, Color Doppler and Cardiac Doppler Indications:     Bacteremia  History:         Patient has prior history of Echocardiogram examinations, most                  recent 04/15/2023. CHF, CAD; Risk Factors:Hypertension,                  Dyslipidemia and Sleep Apnea.                  Aortic Valve: 25 mm Edwards Inspiris Resilia valve is present                  in the aortic position.  Sonographer:     Tinnie Barefoot RDCS Referring Phys:  [id number] ALINE FORBES DOOR Diagnosing Phys: Oneil Parchment MD PROCEDURE: After discussion of the risks and benefits of a TEE, an informed consent was obtained from the patient. The transesophogeal probe was passed without  difficulty through the esophogus of the patient. Imaged were obtained with the patient in a left lateral decubitus position. Sedation performed by different physician. The patient was monitored while under deep sedation. Anesthestetic sedation was provided intravenously by Anesthesiology: 140mg  of Propofol , 100mg  of Lidocaine . The patient's vital signs; including heart rate, blood pressure, and oxygen saturation; remained stable throughout the procedure. The patient developed no complications during the procedure.  IMPRESSIONS  1. Left ventricular ejection fraction, by estimation, is 60 to 65%. The left ventricle has normal function. The left ventricle has no regional wall motion abnormalities.  2. Right ventricular systolic function is normal. The right ventricular size is normal.  3. No left atrial/left atrial appendage thrombus was detected.  4. The mitral valve is normal in structure. Mild mitral valve regurgitation. No evidence of mitral stenosis.  5. The aortic valve has been repaired/replaced. Aortic valve regurgitation is not visualized. No aortic stenosis is present. There is a 25 mm Edwards Inspiris Resilia valve present in the aortic position. Echo findings are consistent with normal structure and function of the aortic valve prosthesis.  6. Aortic root/ascending aorta has been repaired/replaced.  7. The inferior vena cava is normal in size with greater than 50% respiratory variability, suggesting right atrial pressure of 3 mmHg. Conclusion(s)/Recommendation(s): No evidence of vegetation/infective endocarditis on this transesophageael echocardiogram. FINDINGS  Left Ventricle: Left ventricular ejection fraction, by estimation, is 60 to 65%. The left ventricle has normal function. The left ventricle has no regional wall motion abnormalities. The left ventricular internal cavity size was normal in size. There is  no left ventricular hypertrophy. Right Ventricle: The right ventricular size is normal. No  increase in right ventricular wall thickness. Right ventricular systolic function is normal. Left Atrium: Left atrial size was normal in size. No left atrial/left atrial appendage thrombus was detected. Right Atrium: Right atrial size was normal in size. Pericardium: There is no evidence of pericardial effusion. Mitral Valve: The mitral valve is normal in structure. Mild mitral valve regurgitation. No evidence of mitral valve stenosis. Tricuspid Valve: The tricuspid valve is normal in structure. Tricuspid valve regurgitation is not demonstrated. No evidence of tricuspid stenosis. Aortic Valve:  The aortic valve has been repaired/replaced. Aortic valve regurgitation is not visualized. No aortic stenosis is present. Aortic valve mean gradient measures 2.0 mmHg. Aortic valve peak gradient measures 3.2 mmHg. There is a 25 mm Edwards Inspiris Resilia valve present in the aortic position. Echo findings are consistent with normal structure and function of the aortic valve prosthesis. Pulmonic Valve: The pulmonic valve was normal in structure. Pulmonic valve regurgitation is not visualized. No evidence of pulmonic stenosis. Aorta: The aortic root/ascending aorta has been repaired/replaced. Venous: The inferior vena cava is normal in size with greater than 50% respiratory variability, suggesting right atrial pressure of 3 mmHg. IAS/Shunts: No atrial level shunt detected by color flow Doppler.  AORTIC VALVE AV Vmax:      89.40 cm/s AV Vmean:     63.400 cm/s AV VTI:       0.182 m AV Peak Grad: 3.2 mmHg AV Mean Grad: 2.0 mmHg Oneil Parchment MD Electronically signed by Oneil Parchment MD Signature Date/Time: 04/17/2023/1:16:47 PM    Final    US  EKG SITE RITE Result Date: 04/17/2023 If Site Rite image not attached, placement could not be confirmed due to current cardiac rhythm.  EP STUDY Result Date: 04/17/2023 See surgical note for result.  ECHOCARDIOGRAM COMPLETE Result Date: 04/15/2023    ECHOCARDIOGRAM REPORT   Patient  Name:   Luis [name] Date of Exam: 04/15/2023 Medical Rec #:  [id number]        Height:       66.0 in Accession #:    [id number]       Weight:       269.2 lb Date of Birth:  [birth year]        BSA:          2.269 m Patient Age:    67 years         BP:           103/54 mmHg Patient Gender: M                HR:           57 bpm. Exam Location:  Inpatient Procedure: 2D Echo, Cardiac Doppler, Color Doppler and Intracardiac            Opacification Agent Indications:    Bacteremia  History:        Patient has prior history of Echocardiogram examinations, most                 recent 11/24/2022. CHF, CAD and Previous Myocardial Infarction,                 Arrythmias:Atrial Fibrillation, Signs/Symptoms:Dyspnea; Risk                 Factors:Hypertension, Dyslipidemia and Sleep Apnea.                 Aortic Valve: 25 mm Edwards Inspiris Resilia valve is present in                 the aortic position.  Sonographer:    Juanita Shaw Referring Phys: [id number] CORNELIUS N VAN DAM IMPRESSIONS  1. Left ventricular ejection fraction, by estimation, is 65 to 70%. The left ventricle has normal function. The left ventricle has no regional wall motion abnormalities. There is mild left ventricular hypertrophy. Left ventricular diastolic parameters are indeterminate.  2. Right ventricular systolic function is normal. The right ventricular size is normal. Tricuspid regurgitation signal is inadequate for assessing PA pressure.  3. The mitral valve is normal in structure. Trivial mitral valve regurgitation. No evidence of mitral stenosis.  4. The aortic valve has been repaired/replaced. Aortic valve regurgitation is not visualized. There is a 25 mm Edwards Inspiris Resilia valve present in the aortic position. Vmax 2.2 m/s, MG , EOA 1.9 cm^2 Conclusion(s)/Recommendation(s): No evidence of valvular vegetations on this transthoracic echocardiogram, though poor quality study Consider a transesophageal echocardiogram to exclude infective  endocarditis if clinically indicated. FINDINGS  Left Ventricle: Left ventricular ejection fraction, by estimation, is 65 to 70%. The left ventricle has normal function. The left ventricle has no regional wall motion abnormalities. The left ventricular internal cavity size was normal in size. There is  mild left ventricular hypertrophy. Left ventricular diastolic parameters are indeterminate. Right Ventricle: The right ventricular size is normal. No increase in right ventricular wall thickness. Right ventricular systolic function is normal. Tricuspid regurgitation signal is inadequate for assessing PA pressure. Left Atrium: Left atrial size was normal in size. Right Atrium: Right atrial size was normal in size. Pericardium: Trivial pericardial effusion is present. Mitral Valve: The mitral valve is normal in structure. Trivial mitral valve regurgitation. No evidence of mitral valve stenosis. MV peak gradient, 6.2 mmHg. The mean mitral valve gradient is 2.0 mmHg. Tricuspid Valve: The tricuspid valve is normal in structure. Tricuspid valve regurgitation is trivial. Aortic Valve: The aortic valve has been repaired/replaced. Aortic valve regurgitation is not visualized. Aortic valve mean gradient measures 7.5 mmHg. Aortic valve peak gradient measures 17.3 mmHg. Aortic valve area, by VTI measures 2.16 cm. There is a 25 mm Edwards Inspiris Resilia valve present in the aortic position. Pulmonic Valve: The pulmonic valve was not well visualized. Pulmonic valve regurgitation is trivial. Aorta: The aortic root and ascending aorta are structurally normal, with no evidence of dilitation. IAS/Shunts: The interatrial septum was not well visualized.  LEFT VENTRICLE PLAX 2D LVIDd:         4.90 cm      Diastology LVIDs:         3.40 cm      LV e' medial:    6.42 cm/s LV PW:         1.20 cm      LV E/e' medial:  17.4 LV IVS:        1.00 cm      LV e' lateral:   17.30 cm/s LVOT diam:     1.70 cm      LV E/e' lateral: 6.5 LV SV:          81 LV SV Index:   36 LVOT Area:     2.27 cm  LV Volumes (MOD) LV vol d, MOD A2C: 143.0 ml LV vol d, MOD A4C: 214.0 ml LV vol s, MOD A2C: 31.1 ml LV vol s, MOD A4C: 48.3 ml LV SV MOD A2C:     111.9 ml LV SV MOD A4C:     214.0 ml LV SV MOD BP:      135.1 ml RIGHT VENTRICLE RV Basal diam:  3.70 cm RV Mid diam:    2.90 cm RV S prime:     14.50 cm/s TAPSE (M-mode): 2.9 cm LEFT ATRIUM             Index        RIGHT ATRIUM           Index LA diam:        3.90 cm 1.72 cm/m   RA Area:  20.50 cm LA Vol (A2C):   52.2 ml 23.01 ml/m  RA Volume:   55.20 ml  24.33 ml/m LA Vol (A4C):   50.1 ml 22.08 ml/m LA Biplane Vol: 51.2 ml 22.57 ml/m  AORTIC VALVE                     PULMONIC VALVE AV Area (Vmax):    1.96 cm      PV Vmax:       1.10 m/s AV Area (Vmean):   1.83 cm      PV Peak grad:  4.8 mmHg AV Area (VTI):     2.16 cm AV Vmax:           208.00 cm/s AV Vmean:          125.500 cm/s AV VTI:            0.374 m AV Peak Grad:      17.3 mmHg AV Mean Grad:      7.5 mmHg LVOT Vmax:         180.00 cm/s LVOT Vmean:        101.000 cm/s LVOT VTI:          0.355 m LVOT/AV VTI ratio: 0.95  AORTA Ao Root diam: 3.60 cm Ao Asc diam:  3.10 cm MITRAL VALVE MV Area (PHT): 3.12 cm     SHUNTS MV Area VTI:   1.81 cm     Systemic VTI:  0.36 m MV Peak grad:  6.2 mmHg     Systemic Diam: 1.70 cm MV Mean grad:  2.0 mmHg MV Vmax:       1.25 m/s MV Vmean:      66.1 cm/s MV Decel Time: 243 msec MV E velocity: 112.00 cm/s MV A velocity: 98.30 cm/s MV E/A ratio:  1.14 Lonni Nanas MD Electronically signed by Lonni Nanas MD Signature Date/Time: 04/15/2023/2:02:09 PM    Final    CT ABDOMEN PELVIS WO CONTRAST Result Date: 04/13/2023 CLINICAL DATA:  Status post cystectomy on 03/03/2023 with pain and fever, initial encounter EXAM: CT ABDOMEN AND PELVIS WITHOUT CONTRAST TECHNIQUE: Multidetector CT imaging of the abdomen and pelvis was performed following the standard protocol without IV contrast. RADIATION DOSE REDUCTION: This  exam was performed according to the departmental dose-optimization program which includes automated exposure control, adjustment of the mA and/or kV according to patient size and/or use of iterative reconstruction technique. COMPARISON:  12/21/2022 FINDINGS: Lower chest: No acute abnormality. Hepatobiliary: Fatty infiltration of the liver is noted. Gallbladder has been surgically removed. Pancreas: Unremarkable. No pancreatic ductal dilatation or surrounding inflammatory changes. Spleen: Normal in size without focal abnormality. Adrenals/Urinary Tract: Adrenal glands are within normal limits. Kidneys demonstrate no renal calculi. Small exophytic cysts are noted in left kidney stable from the prior exam. No further follow-up is recommended. Mild fullness of the collecting systems is noted bilaterally. A ureteral stent is noted on the right. Interval cystectomy is noted with urostomy in the right lower quadrant. The ureteral stent on the right extends into the ostomy bag. Stomach/Bowel: Stomach is within normal limits. Small bowel shows no obstructive change. Scattered fluid and fecal material is noted throughout the colon. No obstructive changes are seen. Postsurgical changes in the right lower quadrant are noted consistent with creation of an ileal conduit Vascular/Lymphatic: Aortic atherosclerosis. No enlarged abdominal or pelvic lymph nodes. Reproductive: Prostate has been surgically removed during the cystectomy procedure. No fluid is noted in the operative bed. Other: No free fluid  is noted.  No focal herniation is noted. Musculoskeletal: No acute or significant osseous findings. IMPRESSION: Changes consistent with history of prior cystectomy and prostatectomy with ileal conduit formation in the right mid abdomen. Mild fullness of the collecting systems is noted bilaterally. A right-sided nephroureteral stent is noted extending into the ostomy bag. No evidence of postoperative abscess or extravasation is  identified. No acute abnormality is seen. Electronically Signed   By: Oneil Devonshire M.D.   On: 04/13/2023 15:32   DG Chest Port 1 View Result Date: 04/13/2023 CLINICAL DATA:  Possible sepsis EXAM: PORTABLE CHEST 1 VIEW COMPARISON:  Chest x-ray October 20, 2021 FINDINGS: The cardiomediastinal silhouette is unchanged in contour status post median sternotomy. No focal pulmonary opacity. No pleural effusion or pneumothorax. The visualized upper abdomen is unremarkable. No acute osseous abnormality. IMPRESSION: No active disease. Electronically Signed   By: Dirk Arrant M.D.   On: 04/13/2023 14:09      IMPRESSION/PLAN: 1. 68 y.o. 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 We discussed the natural history of invasive urothelial carcinoma and general treatment, highlighting the role of radiotherapy in the management. We discussed the available radiation techniques, and focused on the details and logistics of delivery.  We would recommend he complete his disease staging with CT chest imaging and pending there are no findings to suggest distant metastatic disease, we agree with the recommendation for adjuvant chemoradiation.  The plan is to begin with adjuvant chemotherapy with gemcitabine /cisplatin x 2 cycles followed by a 1 week break prior to starting a 5-week course of daily external beam radiation.  He will have another 1 week break following completion of radiation prior to proceeding with 2 additional cycles of the systemic therapy to complete his course of treatment.  We reviewed the anticipated acute and late sequelae associated with radiation in this setting. The patient and his wife were encouraged to ask questions that were answered to their stated satisfaction. He appears to have a good understanding of his disease and our treatment  recommendations which are of curative intent.    At the end of our conversation, the patient is in agreement to proceed with the recommended adjuvant chemoradiation as above, pending there are no unanticipated findings on CT chest which will be performed to complete his disease staging.  We will share our discussion with Dr. Federico and coordinate for the daily radiotherapy to begin 1 week after completing his second cycle of systemic therapy.  He has freely signed written consent to proceed today in the office and a copy of this document will be placed in his medical record.  We enjoyed meeting him and his wife today and look forward to continuing to participate in his care.  They know that they are welcome to call at anytime with any questions or concerns that may arise in the interim.  We personally spent 75 minutes in this encounter including chart review, reviewing radiological studies, meeting face-to-face with the patient, entering orders, coordinating care and completing documentation.    Sabra MICAEL Rusk, PA-C    Donnice Barge, MD  [facility]  Radiation Oncology Direct Dial: [phone]  Fax: [phone] [email or web address]  Skype  [email or web address]   This document serves as a record of services personally performed by Donnice Barge, MD and Sabra Rusk, PA-C. It was created on their behalf by Izetta  Daubenspeck, a trained medical scribe. The creation of this record is based on the scribe's personal observations and the provider's statements to them. This document has been checked and approved by the attending provider.

## 2023-05-10 ENCOUNTER — Encounter (HOSPITAL_COMMUNITY): Payer: Self-pay | Admitting: Cardiology

## 2023-05-10 MED ORDER — CARVEDILOL 3.125 MG PO TABS: ORAL_TABLET | ORAL | 1 refills | Status: DC

## 2023-05-10 MED ORDER — JARDIANCE 10 MG PO TABS: 10.0000 mg | ORAL_TABLET | Freq: Every day | ORAL | 11 refills | Status: DC

## 2023-05-11 ENCOUNTER — Other Ambulatory Visit: Payer: Self-pay | Admitting: Urology

## 2023-05-11 ENCOUNTER — Telehealth: Payer: Self-pay | Admitting: *Deleted

## 2023-05-11 DIAGNOSIS — C678 Malignant neoplasm of overlapping sites of bladder: Secondary | ICD-10-CM

## 2023-05-11 NOTE — Telephone Encounter (Signed)
Called patient to inform of CT for 05-16-23- arrival time- 7:15 am @ [facility], no restrictions to scan, spoke with patient and he is aware of this scan and the instructions

## 2023-05-12 ENCOUNTER — Other Ambulatory Visit: Payer: Self-pay

## 2023-05-12 DIAGNOSIS — A498 Other bacterial infections of unspecified site: Secondary | ICD-10-CM | POA: Diagnosis not present

## 2023-05-16 ENCOUNTER — Ambulatory Visit: Payer: PPO

## 2023-05-16 ENCOUNTER — Ambulatory Visit (HOSPITAL_COMMUNITY)
Admission: RE | Admit: 2023-05-16 | Discharge: 2023-05-16 | Disposition: A | Discharge status: Home / Self Care | Payer: PPO | Source: Ambulatory Visit | Attending: Urology | Admitting: Urology

## 2023-05-16 ENCOUNTER — Other Ambulatory Visit: Payer: Self-pay | Admitting: Hematology and Oncology

## 2023-05-16 ENCOUNTER — Encounter: Payer: Self-pay | Admitting: Hematology and Oncology

## 2023-05-16 ENCOUNTER — Inpatient Hospital Stay: Payer: PPO | Attending: Hematology and Oncology

## 2023-05-16 ENCOUNTER — Inpatient Hospital Stay (HOSPITAL_BASED_OUTPATIENT_CLINIC_OR_DEPARTMENT_OTHER): Payer: PPO | Admitting: Hematology and Oncology

## 2023-05-16 ENCOUNTER — Ambulatory Visit: Payer: PPO | Admitting: Radiation Oncology

## 2023-05-16 VITALS — BP 199/90 | HR 66 | Temp 97.2°F | Resp 13 | Wt 256.5 lb

## 2023-05-16 DIAGNOSIS — C679 Malignant neoplasm of bladder, unspecified: Secondary | ICD-10-CM

## 2023-05-16 DIAGNOSIS — C678 Malignant neoplasm of overlapping sites of bladder: Secondary | ICD-10-CM | POA: Diagnosis not present

## 2023-05-16 DIAGNOSIS — Z87891 Personal history of nicotine dependence: Secondary | ICD-10-CM | POA: Diagnosis not present

## 2023-05-16 DIAGNOSIS — C673 Malignant neoplasm of anterior wall of bladder: Secondary | ICD-10-CM | POA: Insufficient documentation

## 2023-05-16 DIAGNOSIS — I700 Atherosclerosis of aorta: Secondary | ICD-10-CM | POA: Diagnosis not present

## 2023-05-16 LAB — CMP (CANCER CENTER ONLY)
ALT: 14 U/L (ref 0–44)
AST: 19 U/L (ref 15–41)
Albumin: 4.1 g/dL (ref 3.5–5.0)
Alkaline Phosphatase: 94 U/L (ref 38–126)
Anion gap: 10 (ref 5–15)
BUN: 26 mg/dL — ABNORMAL HIGH (ref 8–23)
CO2: 25 mmol/L (ref 22–32)
Calcium: 8.9 mg/dL (ref 8.9–10.3)
Chloride: 104 mmol/L (ref 98–111)
Creatinine: 1.98 mg/dL — ABNORMAL HIGH (ref 0.61–1.24)
GFR, Estimated: 36 mL/min — ABNORMAL LOW (ref >60)
Glucose, Bld: 126 mg/dL — ABNORMAL HIGH (ref 70–99)
Potassium: 4.2 mmol/L (ref 3.5–5.1)
Sodium: 139 mmol/L (ref 135–145)
Total Bilirubin: 0.4 mg/dL (ref 0.0–1.2)
Total Protein: 8.1 g/dL (ref 6.5–8.1)

## 2023-05-16 LAB — CBC WITH DIFFERENTIAL (CANCER CENTER ONLY)
Abs Immature Granulocytes: 0.13 10*3/uL — ABNORMAL HIGH (ref 0.00–0.07)
Basophils Absolute: 0 10*3/uL (ref 0.0–0.1)
Basophils Relative: 1 %
Eosinophils Absolute: 0.4 10*3/uL (ref 0.0–0.5)
Eosinophils Relative: 5 %
HCT: 31.9 % — ABNORMAL LOW (ref 39.0–52.0)
Hemoglobin: 10.3 g/dL — ABNORMAL LOW (ref 13.0–17.0)
Immature Granulocytes: 2 %
Lymphocytes Relative: 25 %
Lymphs Abs: 1.9 10*3/uL (ref 0.7–4.0)
MCH: 28 pg (ref 26.0–34.0)
MCHC: 32.3 g/dL (ref 30.0–36.0)
MCV: 86.7 fL (ref 80.0–100.0)
Monocytes Absolute: 0.6 10*3/uL (ref 0.1–1.0)
Monocytes Relative: 8 %
Neutro Abs: 4.8 10*3/uL (ref 1.7–7.7)
Neutrophils Relative %: 59 %
Platelet Count: 184 10*3/uL (ref 150–400)
RBC: 3.68 MIL/uL — ABNORMAL LOW (ref 4.22–5.81)
RDW: 19.5 % — ABNORMAL HIGH (ref 11.5–15.5)
WBC Count: 7.9 10*3/uL (ref 4.0–10.5)
nRBC: 0 % (ref 0.0–0.2)

## 2023-05-16 LAB — LACTATE DEHYDROGENASE: LDH: 276 U/L — ABNORMAL HIGH (ref 98–192)

## 2023-05-16 MED ORDER — AMLODIPINE BESYLATE 5 MG PO TABS: 5.0000 mg | ORAL_TABLET | Freq: Every day | ORAL | 1 refills | Status: DC

## 2023-05-16 MED ORDER — IOHEXOL 300 MG/ML  SOLN
75.0000 mL | Freq: Once | INTRAMUSCULAR | Status: AC | PRN
  Administered 2023-05-16: 75 mL via INTRAVENOUS

## 2023-05-16 MED ORDER — LIDOCAINE-PRILOCAINE 2.5-2.5 % EX CREA
1.0000 | TOPICAL_CREAM | CUTANEOUS | 0 refills | Status: DC | PRN
Start: 2023-05-16 — End: 2023-11-09

## 2023-05-16 MED ORDER — PROCHLORPERAZINE MALEATE 10 MG PO TABS: 10.0000 mg | ORAL_TABLET | Freq: Four times a day (QID) | ORAL | 0 refills | Status: AC | PRN

## 2023-05-16 MED ORDER — ONDANSETRON HCL 8 MG PO TABS: 8.0000 mg | ORAL_TABLET | Freq: Three times a day (TID) | ORAL | 0 refills | Status: AC | PRN

## 2023-05-16 NOTE — Progress Notes (Signed)
[facility] [phone]   [phone]  PROGRESS NOTE  Patient Care Team: Nelwyn Salisbury, MD as PCP - General Quintella Reichert, MD as PCP - Sleep Medicine (Cardiology) Laurey Morale, MD as PCP - Advanced Heart Failure (Cardiology) Hillis Range, MD (Inactive) as Consulting Physician (Cardiology) Verner Chol, [facility] (Inactive) as Pharmacist (Pharmacist)  Hematological/Oncological History # BCG-Unresponsive High Risk Non-Muscle Invasive Bladder Cancer 06/2020: left bladder neck recurrence, TURBT T1G3 11/2020: restaging TURBT showed CIS 12/2020: redinduction BCG x6 (deemed not a good surgical candidate)  06/2021: left dome early recurrence 3 cm erythema, no papillary tumor 04/2021: chronic left dome erythema, new left base lateral papillary tumor (3 cm) 06/18/2022: T1G3 prostatic urethra and multifocal bladder 07/29/2022: establish care with Dr. [name]  08/12/2022: Cycle 1 of Pembrolizumab 09/02/2022: Cycle 2 of Pembrolizumab  09/23/2022: Cycle 3 of Pembrolizumab 10/14/2022: Cycle 4 of Pembrolizumab 11/04/2022: Cycle 5 of Pembrolizumab 11/25/2022: Cycle 6 of Pembrolizumab 12/20/2022: Cycle 7 of Pembrolizumab 01/13/2023: Cycle 8 of Pembrolizumab  03/03/2023: Cystectomy/Prostatectomy showed infiltrative high-grade urothelial carcinoma, size 5.8 cm involving bladder dome, anterior wall and prostatic stroma. Tumor invades directly into prostatic stroma at apex and mid portion of the gland (pT4a). Metastatic urothelial cancer in one of two left common iliac lymph nodes.   CHIEF COMPLAINTS/PURPOSE OF CONSULTATION:  "High Risk Non-Muscle Invasive Bladder Cancer "  HISTORY OF PRESENTING ILLNESS:  Luis [name] 68 y.o. 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 In the interim since our last visit he completed his antibiotic therapy on 05/12/2023.   On exam today [name] reports he is doing much better in the interim since her last visit.  He completed his antibiotic therapy and reports that he is feeling "back to 100%".  He reports he feels quite good and "normal".  He feels like he is back to his level of health that he was before his surgery.  He reports that he has talked with radiation oncology who is considering a sandwich approach with chemo followed by radiation and then more chemo.  Patient reports that he is not having any cramps or pain in his abdomen.  He reports he is not having any headache, vision changes, chest pain, or shortness of breath.  He notes that he has been started back up on his carvedilol therapy but that is his only blood pressure medication.  He has been taking furosemide as well which has recently been increased in dose.  He denies fevers, chills, sweats, shortness of breath, chest pain or cough. He has no other complaints. A full 10 point ROS is otherwise negative.  The bulk of our discussion focused on assuring the patient everything necessary to start his chemotherapy treatment.  MEDICAL HISTORY:  Past Medical History:  Diagnosis Date   Anxiety    takes Valium as needed   Aortic stenosis, moderate    Arthritis    Ascending aortic aneurysm (HCC)    CAD (coronary artery disease)    a. s/p PCI of the RCA 8/12 with DES by Dr Excell Seltzer, preserved EF. b. LHC/RHC (2/16) with mean RA 12, PA 32/15, mean PCWP 18, CI 3.47; patent mid and distal RCA stents, 50-60% proximal stenosis small PDA.      Cancer [facility])    bladder   Carotid stenosis    a. Carotid US (05/2013):  Bilateral 1-39% ICA; L thyroid nodule (prior hx of aspiration).  Chronic diastolic CHF (congestive heart failure) (HCC)    Complication of anesthesia    difficulty waking up after gallbladder surgery   Depression    Dyspnea    Essential hypertension    GERD (gastroesophageal reflux disease)    if needed will take OTC meds    Heart murmur    History of colonic  polyps    hyperplastic   Hyperlipidemia    Joint pain    Lesion of bladder    Myocardial infarction (HCC) 2012   Obesity (BMI 30-39.9) 02/29/2016   Pre-diabetes    Restless leg    Sleep apnea    uses cpap   Tubular adenoma of colon    Vertigo    takes Meclizine as needed    SURGICAL HISTORY: Past Surgical History:  Procedure Laterality Date   AORTIC VALVE REPLACEMENT N/A 09/16/2021   Procedure: AORTIC VALVE REPLACEMENT (AVR);  Surgeon: Alleen Borne, MD;  Location: [facility] OR;  Service: Open Heart Surgery;  Laterality: N/A;   CATARACT EXTRACTION   4 YRS AGO   BOTH EYES   CHOLECYSTECTOMY  07/21/2011   Procedure: LAPAROSCOPIC CHOLECYSTECTOMY WITH INTRAOPERATIVE CHOLANGIOGRAM;  Surgeon: Kandis Cocking, MD;  Location: [facility] ORS;  Service: General;  Laterality: N/A;   CORONARY ANGIOPLASTY  2012   2 stents   coronary stenting     s/p PCI of the RCA by Dr Excell Seltzer 8/12 with 2 promus stents   CYSTOSCOPY W/ RETROGRADES Bilateral 12/13/2019   Procedure: CYSTOSCOPY WITH RETROGRADE PYELOGRAM;  Surgeon: Sebastian Ache, MD;  Location: [facility];  Service: Urology;  Laterality: Bilateral;   CYSTOSCOPY W/ RETROGRADES Bilateral 10/14/2020   Procedure: CYSTOSCOPY WITH RETROGRADE PYELOGRAM;  Surgeon: Sebastian Ache, MD;  Location: [facility];  Service: Urology;  Laterality: Bilateral;   CYSTOSCOPY W/ RETROGRADES Bilateral 12/16/2020   Procedure: CYSTOSCOPY WITH RETROGRADE PYELOGRAM;  Surgeon: Sebastian Ache, MD;  Location: [facility];  Service: Urology;  Laterality: Bilateral;   CYSTOSCOPY W/ RETROGRADES Bilateral 06/03/2022   Procedure: CYSTOSCOPY WITH RETROGRADE PYELOGRAM;  Surgeon: Sebastian Ache, MD;  Location: [facility];  Service: Urology;  Laterality: Bilateral;   CYSTOSCOPY W/ RETROGRADES Bilateral 12/28/2022   Procedure: CYSTOSCOPY WITH RETROGRADE PYELOGRAM, FULGARATION OF BLEEDERS;  Surgeon: Loletta Parish., MD;  Location: [facility];  Service: Urology;   Laterality: Bilateral;   CYSTOSCOPY WITH INJECTION N/A 03/03/2023   Procedure: CYSTOSCOPY WITH INDOCYANINE INJECTION;  Surgeon: Loletta Parish., MD;  Location: [facility];  Service: Urology;  Laterality: N/A;  360 MINUTES   LEFT AND RIGHT HEART CATHETERIZATION WITH CORONARY ANGIOGRAM N/A 06/23/2014   Procedure: LEFT AND RIGHT HEART CATHETERIZATION WITH CORONARY ANGIOGRAM;  Surgeon: Laurey Morale, MD;  Location: [facility] CATH LAB;  Service: Cardiovascular;  Laterality: N/A;   NECK SURGERY  03/23/09   per Dr. Ophelia Charter, cervical fusion    PERICARDIOCENTESIS N/A 09/28/2021   Procedure: PERICARDIOCENTESIS;  Surgeon: Corky Crafts, MD;  Location: [facility] INVASIVE CV LAB;  Service: Cardiovascular;  Laterality: N/A;   REPLACEMENT ASCENDING AORTA N/A 09/16/2021   Procedure: REPLACEMENT ASCENDING AORTA WITH 30 X HEMASHIELD PLATINUM WOVEN DOUBLE VELOUR VASCULAR GRAFT;  Surgeon: Alleen Borne, MD;  Location: [facility] OR;  Service: Open Heart Surgery;  Laterality: N/A;  CIRC ARREST   right elbow surgery     RIGHT HEART CATH AND CORONARY ANGIOGRAPHY N/A 07/08/2021   Procedure: RIGHT HEART CATH AND CORONARY ANGIOGRAPHY;  Surgeon: Laurey Morale, MD;  Location: [facility] INVASIVE CV  LAB;  Service: Cardiovascular;  Laterality: N/A;   ROBOT ASSISTED LAPAROSCOPIC COMPLETE CYSTECT ILEAL CONDUIT N/A 03/03/2023   Procedure: XI ROBOTIC ASSISTED LAPAROSCOPIC COMPLETE CYSTECTECTOMY WITH  ILEAL CONDUIT DIVERSION;  Surgeon: Loletta Parish., MD;  Location: [facility];  Service: Urology;  Laterality: N/A;   ROBOT ASSISTED LAPAROSCOPIC RADICAL PROSTATECTOMY N/A 03/03/2023   Procedure: XI ROBOTIC ASSISTED LAPAROSCOPIC RADICAL PROSTATECTOMY WITH LYMPH NODE DISSECTION;  Surgeon: Loletta Parish., MD;  Location: [facility];  Service: Urology;  Laterality: N/A;   solonscopy  05/23/08   per Dr. Celene Kras hemorrhoids only, repeat in 5 years   SUBXYPHOID PERICARDIAL WINDOW N/A 09/28/2021   Procedure: SUBXYPHOID PERICARDIAL WINDOW;   Surgeon: Alleen Borne, MD;  Location: [facility] OR;  Service: Thoracic;  Laterality: N/A;   TEE WITHOUT CARDIOVERSION N/A 06/23/2014   Procedure: TRANSESOPHAGEAL ECHOCARDIOGRAM (TEE);  Surgeon: Laurey Morale, MD;  Location: [facility] ENDOSCOPY;  Service: Cardiovascular;  Laterality: N/A;   TEE WITHOUT CARDIOVERSION N/A 01/21/2016   Procedure: TRANSESOPHAGEAL ECHOCARDIOGRAM (TEE);  Surgeon: Laurey Morale, MD;  Location: [facility] ENDOSCOPY;  Service: Cardiovascular;  Laterality: N/A;   TEE WITHOUT CARDIOVERSION N/A 09/16/2021   Procedure: TRANSESOPHAGEAL ECHOCARDIOGRAM (TEE);  Surgeon: Alleen Borne, MD;  Location: [facility] OR;  Service: Open Heart Surgery;  Laterality: N/A;   TEE WITHOUT CARDIOVERSION N/A 09/28/2021   Procedure: TRANSESOPHAGEAL ECHOCARDIOGRAM (TEE);  Surgeon: Alleen Borne, MD;  Location: [facility] OR;  Service: Thoracic;  Laterality: N/A;   TONSILLECTOMY     TRANSESOPHAGEAL ECHOCARDIOGRAM (CATH LAB) N/A 04/17/2023   Procedure: TRANSESOPHAGEAL ECHOCARDIOGRAM;  Surgeon: Jake Bathe, MD;  Location: [facility] INVASIVE CV LAB;  Service: Cardiovascular;  Laterality: N/A;   TRANSURETHRAL RESECTION OF BLADDER TUMOR N/A 10/21/2019   Procedure: TRANSURETHRAL RESECTION OF BLADDER TUMOR (TURBT);  Surgeon: Ihor Gully, MD;  Location: [facility];  Service: Urology;  Laterality: N/A;   TRANSURETHRAL RESECTION OF BLADDER TUMOR N/A 12/13/2019   Procedure: TRANSURETHRAL RESECTION OF BLADDER TUMOR (TURBT);  Surgeon: Sebastian Ache, MD;  Location: [facility];  Service: Urology;  Laterality: N/A;  1 HR   TRANSURETHRAL RESECTION OF BLADDER TUMOR N/A 10/14/2020   Procedure: TRANSURETHRAL RESECTION OF BLADDER TUMOR (TURBT);  Surgeon: Sebastian Ache, MD;  Location: [facility];  Service: Urology;  Laterality: N/A;   TRANSURETHRAL RESECTION OF BLADDER TUMOR N/A 12/16/2020   Procedure: RESTAGING TRANSURETHRAL RESECTION OF BLADDER TUMOR (TURBT);  Surgeon: Sebastian Ache, MD;  Location:  [facility];  Service: Urology;  Laterality: N/A;   TRANSURETHRAL RESECTION OF BLADDER TUMOR N/A 06/03/2022   Procedure: TRANSURETHRAL RESECTION OF BLADDER TUMOR (TURBT);  Surgeon: Sebastian Ache, MD;  Location: [facility];  Service: Urology;  Laterality: N/A;   UMBILICAL HERNIA REPAIR  03/03/2023   Procedure: HERNIA REPAIR UMBILICAL;  Surgeon: Loletta Parish., MD;  Location: [facility];  Service: Urology;;    SOCIAL HISTORY: Social History   Socioeconomic History   Marital status: Married    Spouse name: Not on file   Number of children: 4   Years of education: Not on file   Highest education level: Not on file  Occupational History   Occupation: Manufacturing engineer    Employer: [employer]  Tobacco Use   Smoking status: Former    Current packs/day: 0.00    Average packs/day: 2.0 packs/day for 40.0 years (80.0 ttl pk-yrs)    Types: Cigarettes    Start date: 66    Quit date: 2012  Years since quitting: 13.0   Smokeless tobacco: Never  Vaping Use   Vaping status: Never Used  Substance and Sexual Activity   Alcohol use: No    Alcohol/week: 0.0 standard drinks of alcohol   Drug use: No   Sexual activity: Not on file  Other Topics Concern   Not on file  Social History Narrative   Not on file   Social Drivers of Health   Financial Resource Strain: Low Risk  (11/03/2022)   Overall Financial Resource Strain (CARDIA)    Difficulty of Paying Living Expenses: Not hard at all  Food Insecurity: No Food Insecurity (05/09/2023)   Hunger Vital Sign    Worried About Running Out of Food in the Last Year: Never true    Ran Out of Food in the Last Year: Never true  Transportation Needs: No Transportation Needs (05/09/2023)   PRAPARE - Administrator, Civil Service (Medical): No    Lack of Transportation (Non-Medical): No  Physical Activity: Unknown (11/03/2022)   Exercise Vital Sign    Days of Exercise per Week: Patient unable to answer    Minutes  of Exercise per Session: 30 min  Stress: No Stress Concern Present (11/03/2022)   [employer] of Occupational Health - Occupational Stress Questionnaire    Feeling of Stress : Not at all  Social Connections: Moderately Isolated (11/03/2022)   Social Connection and Isolation Panel [NHANES]    Frequency of Communication with Friends and Family: More than three times a week    Frequency of Social Gatherings with Friends and Family: More than three times a week    Attends Religious Services: Never    Database administrator or Organizations: No    Attends Banker Meetings: Never    Marital Status: Married  Catering manager Violence: Not At Risk (05/09/2023)   Humiliation, Afraid, Rape, and Kick questionnaire    Fear of Current or Ex-Partner: No    Emotionally Abused: No    Physically Abused: No    Sexually Abused: No    FAMILY HISTORY: Family History  Problem Relation Age of Onset   Lung cancer Mother        lung   Esophageal cancer Cousin    Colon cancer Neg Hx    Rectal cancer Neg Hx    Stomach cancer Neg Hx     ALLERGIES:  has no known allergies.  MEDICATIONS:  Current Outpatient Medications  Medication Sig Dispense Refill   amLODipine (NORVASC) 5 MG tablet Take 1 tablet (5 mg total) by mouth daily. 90 tablet 1   lidocaine-prilocaine (EMLA) cream Apply 1 Application topically as needed. 30 g 0   ondansetron (ZOFRAN) 8 MG tablet Take 1 tablet (8 mg total) by mouth every 8 (eight) hours as needed. 30 tablet 0   prochlorperazine (COMPAZINE) 10 MG tablet Take 1 tablet (10 mg total) by mouth every 6 (six) hours as needed for nausea or vomiting. 30 tablet 0   acetaminophen (TYLENOL) 325 MG tablet Take 650 mg by mouth every 6 (six) hours as needed (for pain).     aspirin EC 81 MG tablet Take 81 mg by mouth in the morning.     carvedilol (COREG) 3.125 MG tablet TAKE 1 TABLET BY MOUTH TWICE A DAY WITH FOOD 180 tablet 1   diazepam (VALIUM) 5 MG tablet TAKE 1 TABLET BY  MOUTH EVERY 12 HOURS AS NEEDED FOR ANXIETY. (Patient taking differently: Take 5 mg by mouth every 12 (twelve)  hours as needed for anxiety.) 60 tablet 5   ferrous sulfate 325 (65 FE) MG tablet Take 325 mg by mouth daily with breakfast.     furosemide (LASIX) 20 MG tablet TAKE 2 TABLETS (40 MG TOTAL) BY MOUTH EVERY MORNING AND 1 TABLET (20 MG TOTAL) EVERY EVENING. 270 tablet 1   ipratropium (ATROVENT) 0.03 % nasal spray Place 2 sprays into both nostrils every 12 (twelve) hours as needed for rhinitis.     JARDIANCE 10 MG TABS tablet Take 1 tablet (10 mg total) by mouth daily. 30 tablet 11   Magnesium 500 MG TABS Take 500 mg by mouth in the morning.     meclizine (ANTIVERT) 25 MG tablet Take 1 tablet (25 mg total) by mouth 3 (three) times daily as needed for dizziness. 30 tablet 0   multivitamin-iron-minerals-folic acid (CENTRUM) chewable tablet Chew 1 tablet by mouth daily.     nitroGLYCERIN (NITROSTAT) 0.4 MG SL tablet Place 0.4 mg under the tongue every 5 (five) minutes as needed for chest pain.     potassium chloride SA (KLOR-CON M20) 20 MEQ tablet Take 2 tablets (40 mEq total) by mouth daily. (Patient taking differently: Take 20 mEq by mouth 2 (two) times daily.) 180 tablet 3   rosuvastatin (CRESTOR) 20 MG tablet TAKE 1 TABLET BY MOUTH EVERYDAY AT BEDTIME (Patient taking differently: Take 20 mg by mouth at bedtime.) 90 tablet 3   venlafaxine XR (EFFEXOR-XR) 150 MG 24 hr capsule TAKE 1 CAPSULE BY MOUTH DAILY WITH BREAKFAST. 90 capsule 0   No current facility-administered medications for this visit.    REVIEW OF SYSTEMS:   Constitutional: ( - ) fevers, ( - )  chills , ( - ) night sweats Eyes: ( - ) blurriness of vision, ( - ) double vision, ( - ) watery eyes Ears, nose, mouth, throat, and face: ( - ) mucositis, ( - ) sore throat Respiratory: ( - ) cough, ( - ) dyspnea, ( - ) wheezes Cardiovascular: ( - ) palpitation, ( - ) chest discomfort, ( - ) lower extremity swelling Gastrointestinal:  ( - )  nausea, ( - ) heartburn, ( - ) change in bowel habits Skin: ( - ) abnormal skin rashes Lymphatics: ( - ) new lymphadenopathy, ( - ) easy bruising Neurological: ( - ) numbness, ( - ) tingling, ( - ) new weaknesses Behavioral/Psych: ( - ) mood change, ( - ) new changes  All other systems were reviewed with the patient and are negative.  PHYSICAL EXAMINATION: ECOG PERFORMANCE STATUS: 1 - Symptomatic but completely ambulatory  Vitals:   05/16/23 1002  BP: (!) 199/90  Pulse: 66  Resp: 13  Temp: (!) 97.2 F (36.2 C)  SpO2: 100%    Filed Weights   05/16/23 1002  Weight: 256 lb 8 oz (116.3 kg)     GENERAL: well appearing elderly Caucasian male in NAD  SKIN: skin color, texture, turgor are normal, no rashes or significant lesions EYES: conjunctiva are pink and non-injected, sclera clear LUNGS: clear to auscultation and percussion with normal breathing effort HEART: regular rate & rhythm and no murmurs and no lower extremity edema Musculoskeletal: no cyanosis of digits and no clubbing  PSYCH: alert & oriented x 3, fluent speech NEURO: no focal motor/sensory deficits  LABORATORY DATA:  I have reviewed the data as listed    Latest Ref Rng & Units 05/16/2023    9:32 AM 04/20/2023    2:38 PM 04/18/2023    5:43 AM  CBC  WBC 4.0 - 10.5 K/uL 7.9  6.0  6.0   Hemoglobin 13.0 - 17.0 g/dL 40.9  8.8  7.8   Hematocrit 39.0 - 52.0 % 31.9  28.6  24.9   Platelets 150 - 400 K/uL 184  204  154        Latest Ref Rng & Units 05/16/2023    9:32 AM 04/25/2023    3:25 PM 04/20/2023    2:38 PM  CMP  Glucose 70 - 99 mg/dL 811  96  914   BUN 8 - 23 mg/dL 26  11  9    Creatinine 0.61 - 1.24 mg/dL 7.82  9.56  2.13   Sodium 135 - 145 mmol/L 139  139  141   Potassium 3.5 - 5.1 mmol/L 4.2  4.2  3.9   Chloride 98 - 111 mmol/L 104  104  106   CO2 22 - 32 mmol/L 25  25  27    Calcium 8.9 - 10.3 mg/dL 8.9  8.4  8.6   Total Protein 6.5 - 8.1 g/dL 8.1   7.3   Total Bilirubin 0.0 - 1.2 mg/dL 0.4   0.4    Alkaline Phos 38 - 126 U/L 94   103   AST 15 - 41 U/L 19   24   ALT 0 - 44 U/L 14   30     RADIOGRAPHIC STUDIES: CT ANGIO CHEST AORTA W/CM & OR WO/CM Result Date: 04/18/2023 CLINICAL DATA:  Graft versus host disease EXAM: CT ANGIOGRAPHY CHEST WITH CONTRAST TECHNIQUE: Multidetector CT imaging of the chest was performed using the standard protocol during bolus administration of intravenous contrast. Multiplanar CT image reconstructions and MIPs were obtained to evaluate the vascular anatomy. RADIATION DOSE REDUCTION: This exam was performed according to the departmental dose-optimization program which includes automated exposure control, adjustment of the mA and/or kV according to patient size and/or use of iterative reconstruction technique. CONTRAST:  OMNIPAQUE IOHEXOL 350 MG/ML SOLN COMPARISON:  12/14/2022 FINDINGS: Cardiovascular: Preferential opacification of the thoracic aorta. Status post Bentall type aortic root graft repair with unchanged contour and caliber of both the graft and of the remaining portion of the vessel. No acute findings. Mild atherosclerosis. Normal heart size. Three-vessel coronary artery calcifications. No pericardial effusion. Right upper extremity PICC. Mediastinum/Nodes: No enlarged mediastinal, hilar, or axillary lymph nodes. Thyroid gland, trachea, and esophagus demonstrate no significant findings. Lungs/Pleura: Mild diffuse bilateral bronchial wall thickening. No pleural effusion or pneumothorax. Upper Abdomen: No acute abnormality. Hepatic steatosis. Status post cholecystectomy. Musculoskeletal: Status post median sternotomy. No acute osseous findings. Review of the MIP images confirms the above findings. IMPRESSION: 1. Status post Bentall type aortic root graft repair with unchanged contour and caliber of both the graft and the remaining portion of the vessel. No acute findings. 2. Mild diffuse bilateral bronchial wall thickening, consistent with nonspecific  infectious or inflammatory bronchitis. 3. Coronary artery disease. 4. Hepatic steatosis. Electronically Signed   By: Jearld Lesch M.D.   On: 04/18/2023 11:32   ECHO TEE Result Date: 04/17/2023    TRANSESOPHOGEAL ECHO REPORT   Patient Name:   Luis [name] Date of Exam: 04/17/2023 Medical Rec #:  [id number]        Height:       66.0 in Accession #:    [id number]       Weight:       264.3 lb Date of Birth:  1956/02/21        BSA:  2.251 m Patient Age:    12 years         BP:           126/50 mmHg Patient Gender: M                HR:           55 bpm. Exam Location:  Inpatient Procedure: Transesophageal Echo, Color Doppler and Cardiac Doppler Indications:     Bacteremia  History:         Patient has prior history of Echocardiogram examinations, most                  recent 04/15/2023. CHF, CAD; Risk Factors:Hypertension,                  Dyslipidemia and Sleep Apnea.                  Aortic Valve: 25 mm Edwards Inspiris Resilia valve is present                  in the aortic position.  Sonographer:     Delcie Roch RDCS Referring Phys:  [id number] Corrin Parker Diagnosing Phys: Donato Schultz MD PROCEDURE: After discussion of the risks and benefits of a TEE, an informed consent was obtained from the patient. The transesophogeal probe was passed without difficulty through the esophogus of the patient. Imaged were obtained with the patient in a left lateral decubitus position. Sedation performed by different physician. The patient was monitored while under deep sedation. Anesthestetic sedation was provided intravenously by Anesthesiology: 140mg  of Propofol, 100mg  of Lidocaine. The patient's vital signs; including heart rate, blood pressure, and oxygen saturation; remained stable throughout the procedure. The patient developed no complications during the procedure.  IMPRESSIONS  1. Left ventricular ejection fraction, by estimation, is 60 to 65%. The left ventricle has normal function. The left ventricle  has no regional wall motion abnormalities.  2. Right ventricular systolic function is normal. The right ventricular size is normal.  3. No left atrial/left atrial appendage thrombus was detected.  4. The mitral valve is normal in structure. Mild mitral valve regurgitation. No evidence of mitral stenosis.  5. The aortic valve has been repaired/replaced. Aortic valve regurgitation is not visualized. No aortic stenosis is present. There is a 25 mm Edwards Inspiris Resilia valve present in the aortic position. Echo findings are consistent with normal structure and function of the aortic valve prosthesis.  6. Aortic root/ascending aorta has been repaired/replaced.  7. The inferior vena cava is normal in size with greater than 50% respiratory variability, suggesting right atrial pressure of 3 mmHg. Conclusion(s)/Recommendation(s): No evidence of vegetation/infective endocarditis on this transesophageael echocardiogram. FINDINGS  Left Ventricle: Left ventricular ejection fraction, by estimation, is 60 to 65%. The left ventricle has normal function. The left ventricle has no regional wall motion abnormalities. The left ventricular internal cavity size was normal in size. There is  no left ventricular hypertrophy. Right Ventricle: The right ventricular size is normal. No increase in right ventricular wall thickness. Right ventricular systolic function is normal. Left Atrium: Left atrial size was normal in size. No left atrial/left atrial appendage thrombus was detected. Right Atrium: Right atrial size was normal in size. Pericardium: There is no evidence of pericardial effusion. Mitral Valve: The mitral valve is normal in structure. Mild mitral valve regurgitation. No evidence of mitral valve stenosis. Tricuspid Valve: The tricuspid valve is normal in structure. Tricuspid valve regurgitation is not demonstrated.  No evidence of tricuspid stenosis. Aortic Valve: The aortic valve has been repaired/replaced. Aortic valve  regurgitation is not visualized. No aortic stenosis is present. Aortic valve mean gradient measures 2.0 mmHg. Aortic valve peak gradient measures 3.2 mmHg. There is a 25 mm Edwards Inspiris Resilia valve present in the aortic position. Echo findings are consistent with normal structure and function of the aortic valve prosthesis. Pulmonic Valve: The pulmonic valve was normal in structure. Pulmonic valve regurgitation is not visualized. No evidence of pulmonic stenosis. Aorta: The aortic root/ascending aorta has been repaired/replaced. Venous: The inferior vena cava is normal in size with greater than 50% respiratory variability, suggesting right atrial pressure of 3 mmHg. IAS/Shunts: No atrial level shunt detected by color flow Doppler.  AORTIC VALVE AV Vmax:      89.40 cm/s AV Vmean:     63.400 cm/s AV VTI:       0.182 m AV Peak Grad: 3.2 mmHg AV Mean Grad: 2.0 mmHg Donato Schultz MD Electronically signed by Donato Schultz MD Signature Date/Time: 04/17/2023/1:16:47 PM    Final    [place] EKG SITE RITE Result Date: 04/17/2023 If Site Rite image not attached, placement could not be confirmed due to current cardiac rhythm.  EP STUDY Result Date: 04/17/2023 See surgical note for result.   ASSESSMENT & PLAN Luis [name] 68 y.o. 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  After review of the labs, review of the records, and discussion with the patient the patients findings are most consistent with BCG unresponsive high risk non-muscle invasive bladder cancer, not a candidate for cystectomy.  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 21 days until progression or intolerance up to 24 months. --Started Cycle 1 of Pembrolizumab on 08/12/2022.  PLAN: --Labs from today were reviewed. WBC 7.9, Hgb 10.3, MCV  86.7, Plt 184 -- Given the results of the pathology showing positive margins and spread to the lymph node we are considering radiation therapy versus chemotherapy.  For chemotherapy I recommend gemcitabine and cisplatin x 4 cycles.  This would consist of gemcitabine 1000 mg/m on days 1 and 8 and cisplatin 70 mg/m on day 1 of 21-day cycle for 4 cycles. --Additionally prior to the start of adjuvant therapy allowed patient time to recover from his surgery. -- We will plan to proceed with adjuvant chemotherapy and radiation.  This was delayed due to the fact the patient was on antibiotics.  His last day of antibiotics was Friday, 05/12/2023.   --patient is OK to start treatment  -Return to clinic for the start of cycle 1 day 1 of chemotherapy.  This will be scheduled once we have the patient's port placement and chemotherapy education scheduled.  #AKI -- Cr rose to 1.98, likely in the setting of increased dosage of Lasix, CT scan with contrast today, as well as his long course of antibiotic therapy. -- Recommend increased hydration and will have the patient return to clinic next week in order to assure his AKI is resolving.  # Abdominal Rash--improved -- Small circular rash, approximately 3 inches in diameter. -- Patient has access to hydrocortisone 5% at home, recommend he apply that to the lesion 1-2 times daily to see if there is improvement.  #Supportive Care -- chemotherapy education to be scheduled  -- port placement to be scheduled.  -- zofran 8mg   q8H PRN and compazine 10mg  PO q6H for nausea -- EMLA cream for port -- no pain medication required at this time.    Orders Placed This Encounter  Procedures   IR IMAGING GUIDED PORT INSERTION    Standing Status:   Future    Expected Date:   05/23/2023    Expiration Date:   05/15/2024    Reason for Exam (SYMPTOM  OR DIAGNOSIS REQUIRED):   adjuvant chemo for bladder cancer, patient requires a port    Preferred Imaging Location?:   [facility]   All questions were answered. The patient knows to call the clinic with any problems, questions or concerns.  I have spent a total of 30 minutes minutes of face-to-face and non-face-to-face time, preparing to see the patient, performing a medically appropriate examination, counseling and educating the patient, documenting clinical information in the electronic health record,and care coordination.   Ulysees Barns, MD Department of Hematology/Oncology [facility] at [facility] Phone: [phone] Pager: [phone] Email: [email or web address]    05/16/2023 11:03 AM  Literature Support:  Pembrolizumab monotherapy for the treatment of high-risk non-muscle-invasive bladder cancer unresponsive to BCG ([id number]): an open-label, single-arm, multicentre, phase 2 study,The Lancet Oncology,Volume 22, Issue 7,2021,Pages 919-930,ISSN 2956-2130  -- Pembrolizumab monotherapy was tolerable and showed promising antitumour activity in patients with BCG-unresponsive non-muscle-invasive bladder cancer who declined or were ineligible for radical cystectomy and should be considered a a clinically active non-surgical treatment option in this difficult-to-treat population.  --All enrolled patients were assigned to receive pembrolizumab 200 mg intravenously every 3 weeks for up to 24 months or until centrally confirmed disease persistence, recurrence, or progression; unacceptable toxic effects

## 2023-05-17 ENCOUNTER — Telehealth: Payer: Self-pay

## 2023-05-17 NOTE — Telephone Encounter (Signed)
Notified Patient of prior authorization approval for Lidocaine-Prilocaine 25% Cream and Ondansetron 8 mg Tablets. Ondansetron is approved through 04/24/2024 and Lidocaine-Prilocaine 2.5% is approved through 08/14/2023. No other needs or concerns noted at this time.

## 2023-05-19 ENCOUNTER — Telehealth: Payer: Self-pay | Admitting: Urology

## 2023-05-19 NOTE — Telephone Encounter (Signed)
I called the patient to inform him that the recent CT Chest for disease staging did not show any evidence of metastatic disease in the chest. He is scheduled for [facility] placement 05/29/23 and will begin his chemotherapy shortly thereafter. We will remain in close communication with plans for CT SIM/treatment planning at the completion of his 2nd cycle of chemotherapy, in anticipation of beginning the 5 week course of daily external beam radiation approximately 1 week after the 2nd cycle of chemo.  He and his wife appear to have a good understanding of out treatment plan and are comfortable and in agreement to proceed.  Marguarite Arbour, MMS, PA-C [facility] Radiation Oncology Physician Assistant Direct Dial: [phone]  Fax: [phone]

## 2023-05-22 ENCOUNTER — Other Ambulatory Visit: Payer: Self-pay

## 2023-05-22 ENCOUNTER — Encounter (HOSPITAL_COMMUNITY): Payer: Self-pay | Admitting: Cardiology

## 2023-05-23 ENCOUNTER — Inpatient Hospital Stay: Payer: PPO

## 2023-05-23 ENCOUNTER — Other Ambulatory Visit: Payer: Self-pay | Admitting: Physician Assistant

## 2023-05-23 DIAGNOSIS — C679 Malignant neoplasm of bladder, unspecified: Secondary | ICD-10-CM

## 2023-05-23 DIAGNOSIS — C673 Malignant neoplasm of anterior wall of bladder: Secondary | ICD-10-CM | POA: Diagnosis not present

## 2023-05-23 LAB — CBC WITH DIFFERENTIAL (CANCER CENTER ONLY)
Abs Immature Granulocytes: 0.02 10*3/uL (ref 0.00–0.07)
Basophils Absolute: 0 10*3/uL (ref 0.0–0.1)
Basophils Relative: 0 %
Eosinophils Absolute: 0.3 10*3/uL (ref 0.0–0.5)
Eosinophils Relative: 4 %
HCT: 28.5 % — ABNORMAL LOW (ref 39.0–52.0)
Hemoglobin: 9.3 g/dL — ABNORMAL LOW (ref 13.0–17.0)
Immature Granulocytes: 0 %
Lymphocytes Relative: 25 %
Lymphs Abs: 1.7 10*3/uL (ref 0.7–4.0)
MCH: 28.1 pg (ref 26.0–34.0)
MCHC: 32.6 g/dL (ref 30.0–36.0)
MCV: 86.1 fL (ref 80.0–100.0)
Monocytes Absolute: 0.6 10*3/uL (ref 0.1–1.0)
Monocytes Relative: 8 %
Neutro Abs: 4.3 10*3/uL (ref 1.7–7.7)
Neutrophils Relative %: 63 %
Platelet Count: 196 10*3/uL (ref 150–400)
RBC: 3.31 MIL/uL — ABNORMAL LOW (ref 4.22–5.81)
RDW: 18.6 % — ABNORMAL HIGH (ref 11.5–15.5)
WBC Count: 6.9 10*3/uL (ref 4.0–10.5)
nRBC: 0 % (ref 0.0–0.2)

## 2023-05-23 LAB — CMP (CANCER CENTER ONLY)
ALT: 19 U/L (ref 0–44)
AST: 21 U/L (ref 15–41)
Albumin: 3.9 g/dL (ref 3.5–5.0)
Alkaline Phosphatase: 99 U/L (ref 38–126)
Anion gap: 9 (ref 5–15)
BUN: 26 mg/dL — ABNORMAL HIGH (ref 8–23)
CO2: 25 mmol/L (ref 22–32)
Calcium: 8.8 mg/dL — ABNORMAL LOW (ref 8.9–10.3)
Chloride: 104 mmol/L (ref 98–111)
Creatinine: 1.91 mg/dL — ABNORMAL HIGH (ref 0.61–1.24)
GFR, Estimated: 37 mL/min — ABNORMAL LOW (ref >60)
Glucose, Bld: 161 mg/dL — ABNORMAL HIGH (ref 70–99)
Potassium: 3.9 mmol/L (ref 3.5–5.1)
Sodium: 138 mmol/L (ref 135–145)
Total Bilirubin: 0.5 mg/dL (ref 0.0–1.2)
Total Protein: 8.1 g/dL (ref 6.5–8.1)

## 2023-05-23 NOTE — Progress Notes (Signed)
 Pharmacist Chemotherapy Monitoring - Initial Assessment    Anticipated start date: 05/30/23   The following has been reviewed per standard work regarding the patient's treatment regimen: The patient's diagnosis, treatment plan and drug doses, and organ/hematologic function Lab orders and baseline tests specific to treatment regimen  The treatment plan start date, drug sequencing, and pre-medications Prior authorization status  Patient's documented medication list, including drug-drug interaction screen and prescriptions for anti-emetics and supportive care specific to the treatment regimen The drug concentrations, fluid compatibility, administration routes, and timing of the medications to be used The patient's access for treatment and lifetime cumulative dose history, if applicable  The patient's medication allergies and previous infusion related reactions, if applicable   Changes made to treatment plan:  N/A  Follow up needed:  N/A  Amethyst Gainer, PharmD, MBA\

## 2023-05-23 NOTE — H&P (View-Only) (Signed)
Patient ID: Luis [name], male   DOB: 10-20-1955, 68 y.o.   MRN: [id number] PCP: Dr. Clent Ridges Cardiology: Dr. Shirlee Latch  CC: HF follow up  68 y.o. with history of CAD and moderate aortic stenosis presents for follow of AS and CAD. He had RCA PCI in 8/12. Metastatic bladder cancer previously treated with chemo and TURBT, now s/p cystoprostatectomy and lymph node dissection 11/24.  When I saw him in early 2016, he reported significant exertional dyspnea.  Did Lexiscan Cardiolite in 2/16.  This showed no ischemia or infarction.  Echo showed ?bicuspid aortic valve and probably moderate aortic stenosis. Given echo results RHC/LHC and TEE in 2/16. The RHC showed mildly elevated left and right heart filling pressures and the LHC showed nonobstructive CAD.  TEE confirmed bicuspid aortic valve with moderate AS. PFTs in 4/16 that were within normal limits. Started on Lasix 20 mg daily.  He seemed to improve.    Progressive dypnea in fall 2017.  RHC/LHC in 2017 showed mildly elevated left heart filling pressure and nonobstructive CAD.  TEE showed moderate aortic stenosis, bicuspid valve. Stable ascending aorta dilation.   Echo in 9/18 showed EF 60-65% with bicuspid aortic valve and moderate AS.  CTA chest showed 4.5 cm ascending aorta.  Repeat CTA chest in 8/19 showed stable 4.5 cm ascending aorta.  Echo in 8/20 showed EF 60-65%, moderate AS, 4.5 cm ascending aorta. CTA chest in 8/20 with 4.6 cm ascending aorta. Echo in 1/22 showed EF 60-65%, mild LVH, moderate AS with mean gradient 32 and AVA 1.6 cm^2, bicuspid aortic valve, ascending aorta 46 mm. CTA chest in 8/22 with 4.4 cm ascending aorta.   Echo 3/23 showed EF 60-65% with mild LVH, normal RV, bicuspid aortic valve with severe AS and mean gradient 48 mmHg, AVA 0.91 cm^2.  R/LHC showed mild CAD, patent RCA stents, normal PCWP, mildly elevated RA pressure, preserved CO.  Follow up 3/23, continued with exertional dyspnea. Referred to [facility] for evaluation of AVR,  arranged for [facility] as part of work up.   Underwent AV replacement and replacement of ascending aorta with Dr. Laneta Simmers 5/23. Had post-op atrial fibrillation, started on amiodarone/warfarin. Presented back to ED 1 week after discharge with LOC. Echo showed moderate pericardial effusion with maximal diameter of 3 cm over the right ventricle with some compression, LV and aortic valve function normal. Attempted pericardiocentesis in cath lab, but unsuccessful and placed sub-xiphoid pericardial window in OR, with 850 cc fluid removed. Discharged home, weight 275 lbs.  Echo 7/23 showed EF 60-65%, normal RV, stable bioprosthetic aortic valve.  Echo 8/24 showed EF 60-65%, normal RV, stable bioprosthetic aortic valve  He had hematuria and found to have recurrence of bladder cancer, now getting pembrolizumab infusions.   S/p cystoprostatectomy and lymph node dissection 03/03/23. Based on pathology results, Oncology considering chemotherapy with gemcitabine and cisplatin +/- adjunct radiation therapy.    Admitted 12/24 with sepsis & AKI. BCx grew Enterococcus faecalis. ID consulted and he required vasopressors and ICU care. Echo showed EF 65-70%, RV normal. TEE showed EF 60-65%, RV normal, no valvular vegetation or thrombus. He was discharged home with PICC on IV abx.  Today he returns for post hospital HF follow up with his wife. Overall feeling fine. He has finished his IV abx and PICC is out. He has SOB walking uphill, otherwise no dyspnea. Denies palpitations, abnormal bleeding, CP, dizziness, edema, or PND/Orthopnea. Appetite ok. No fever or chills. Weight at home 253 pounds. Taking all medications, he has restarted  his Jardiance since discharge. Has seen Rad Onc, planning chemo=>radiation, followed by more chemo. Will have port placed next Monday.  ReDs reading: 41%, abnormal  ECG (personally reviewed): NSR 61 bpm  Labs (6/23): K 4.1, creatinine 1.0, hgb 8.3, Lp(a) 116 Labs (10/23): K 4.0, creatinine  1.34, LDL 71 Labs (7/24): K 3.7, creatinine 1.10, hgb 14.5 Labs (9/24): K 3.7, creatinine 1.19 Labs (11/24): K 3.5, creatinine 0.92, hgb 10.1 Labs (1/25): K 4.2, creatinine 1.98  PMH: 1. OSA: Using CPAP.  2. CAD: Cardiolite 8/12 with inferior infarct and peri-infarct ischemia, had PCI to the mid and distal RCA with Promus DES x 2.  Lexiscan Cardiolite (2/16) with no ischemia or infarction. LHC/RHC (2/16) with mean RA 12, PA 32/15, mean PCWP 18, CI 3.47; patent mid and distal RCA stents, 50-60% proximal stenosis small PDA.   - LHC/RHC (9/17): 60% proximal PDA; mean RA 2, PA 24/8, mean PCWP 18, CI 2.13.  - Cardiolite (8/17): EF 56%, normal.  - R/LHC (3/23, AVR work up): mid RCA lesion 40% stenosis, previously placed prox RCA to Mid RCA stent (unknown type) widely patent, previously placed dist RCA stent (unknown type) widely patent; mean RA 13, PA 23/8, mean PCWP 11, CI 2.65.  3. Carotid stenosis: 2/15 carotid dopplers 1-39% bilateral ICA stenosis.  4. Colon polyps 5. HTN 6. Hyperlipidemia 7. Abdominal US (10/12) with no AAA 8. Aortic stenosis: Echo (2/15) with EF 60-65%, mild LVH, moderate diastolic dysfunction, normal RV size/systolic function, moderate aortic stenosis with mean gradient 31 and AVA 1.16 cm2, ascending aorta 4.4 cm.  Echo (2/16) with EF 60-65%, grade II diastolic dysfunction, ?bicuspid aortic valve with moderate AS (mean gradient 35 mmHg, AVA 1.5 cm^2), mildly dilated RV with normal systolic function.  TEE (2/16) with bicuspid aortic valve, moderate AS mean gradient 30 mmHg, AVA > 1 cm^2, EF 55-60%, mild LVH, mildly dilated RV with normal systolic function, mild AI, mild MR, ascending aorta 4.2 cm.  - Echo (8/17): EF 60-65%, grade II diastolic dysfunction, bicuspid aortic valve with mean gradient 31 mmHg, probably moderate stenosis.  - TEE (9/17): EF 55-60%, bicuspid aortic valve with moderate AS with mean gradient 24 mmHg and AVA 1.05 cm^2, ascending aorta 4.2 cm.  - Echo (9/18):  EF 60-65%, bicuspid aortic valve, AVA 1.05 cm^2 with mean gradient 24 mmHg (moderate AS).  - Echo (8/20): EF 60-65%, mild RV dilation with normal systolic function, moderate AS with AVA 1.46 cm^2 and mean gradient 36 mmHg, 4.5 cm ascending aorta.  - Echo (1/22): EF 60-65%, mild LVH, moderate AS with mean gradient 32 and AVA 1.6 cm^2, bicuspid aortic valve, ascending aorta 46 mm.  - Echo (3/23): EF 60-65% with mild LVH, normal RV, bicuspid aortic valve with severe AS and mean gradient 48 mmHg, AVA 0.91 cm^2. 4.5 cm ascending aorta.  - s/p bioprosthetic AVR with a 25 mm Edwards Resilia Aortic Valve and Replacement of Ascending Aorta (5/23). - Echo (7/23): EF 60-65%, normal RV, stable bioprosthetic aortic valve.  - Echo (8/24): EF 60-65%, normal RV, stable bioprosthetic valve - Echo (12/24): EF 60-65%, normal RV, stable bioprosthetic valve. - TEE (12/24): EF 60-65%, no vegetation or thrombus. 9. Ascending aorta aneurysm: 4.4 cm on 2/15 echo. Ascending aorta 4.2 cm TEE 2/16.  Ascending aorta 4.2 cm on 9/17 TEE.  - CTA chest (9/18): 4.5 cm ascending aorta.  - CTA chest (8/19): 4.5 cm ascending aorta - CTA chest (8/20): 4.6 cm ascending aorta - CTA chest (8/22): 4.4 cm ascending aorta -  replacement of ascending aorta 5/23. 10. Chronic diastolic CHF.  11. PFTs (4/16) were within normal limits.  12. Lower extremity arterial dopplers normal in 8/17.  13. Bladder cancer: S/p chemo and TURBT 14. Pericardial tamponade with pericardial window s/p AVR in 5/23.  15. Atrial fibrillation: Paroxysmal, noted post-op AVR.   SH: Quit smoking in 2013, married, works as Curator.  FH: Father with MI, AVR.  Sister with RyR2 gene.   ROS: All systems reviewed and negative except as per HPI.   Current Outpatient Medications  Medication Sig Dispense Refill   acetaminophen (TYLENOL) 325 MG tablet Take 650 mg by mouth every 6 (six) hours as needed (for pain).     amLODipine (NORVASC) 5 MG tablet Take 1 tablet (5 mg  total) by mouth daily. 90 tablet 1   aspirin EC 81 MG tablet Take 81 mg by mouth in the morning.     carvedilol (COREG) 3.125 MG tablet TAKE 1 TABLET BY MOUTH TWICE A DAY WITH FOOD 180 tablet 1   diazepam (VALIUM) 5 MG tablet TAKE 1 TABLET BY MOUTH EVERY 12 HOURS AS NEEDED FOR ANXIETY. (Patient taking differently: Take 5 mg by mouth every 12 (twelve) hours as needed for anxiety.) 60 tablet 5   ferrous sulfate 325 (65 FE) MG tablet Take 325 mg by mouth daily with breakfast.     furosemide (LASIX) 20 MG tablet TAKE 2 TABLETS (40 MG TOTAL) BY MOUTH EVERY MORNING AND 1 TABLET (20 MG TOTAL) EVERY EVENING. (Patient taking differently: Patient takes 40 mg by mouth twice a day.) 270 tablet 1   ipratropium (ATROVENT) 0.03 % nasal spray Place 2 sprays into both nostrils every 12 (twelve) hours as needed for rhinitis.     JARDIANCE 10 MG TABS tablet Take 1 tablet (10 mg total) by mouth daily. 30 tablet 11   lidocaine-prilocaine (EMLA) cream Apply 1 Application topically as needed. 30 g 0   Magnesium 500 MG TABS Take 500 mg by mouth in the morning.     meclizine (ANTIVERT) 25 MG tablet Take 1 tablet (25 mg total) by mouth 3 (three) times daily as needed for dizziness. 30 tablet 0   multivitamin-iron-minerals-folic acid (CENTRUM) chewable tablet Chew 1 tablet by mouth daily.     nitroGLYCERIN (NITROSTAT) 0.4 MG SL tablet Place 0.4 mg under the tongue every 5 (five) minutes as needed for chest pain.     ondansetron (ZOFRAN) 8 MG tablet Take 1 tablet (8 mg total) by mouth every 8 (eight) hours as needed. 30 tablet 0   potassium chloride SA (KLOR-CON M20) 20 MEQ tablet Take 2 tablets (40 mEq total) by mouth daily. (Patient taking differently: Take 20 mEq by mouth 2 (two) times daily.) 180 tablet 3   prochlorperazine (COMPAZINE) 10 MG tablet Take 1 tablet (10 mg total) by mouth every 6 (six) hours as needed for nausea or vomiting. 30 tablet 0   rosuvastatin (CRESTOR) 20 MG tablet TAKE 1 TABLET BY MOUTH EVERYDAY AT  BEDTIME (Patient taking differently: Take 20 mg by mouth at bedtime.) 90 tablet 3   venlafaxine XR (EFFEXOR-XR) 150 MG 24 hr capsule TAKE 1 CAPSULE BY MOUTH DAILY WITH BREAKFAST. 90 capsule 0   No current facility-administered medications for this encounter.   Wt Readings from Last 3 Encounters:  05/24/23 119 kg (262 lb 6.4 oz)  05/16/23 116.3 kg (256 lb 8 oz)  05/09/23 116.7 kg (257 lb 3.2 oz)   BP (!) 140/70   Pulse 64   Wt  119 kg (262 lb 6.4 oz)   SpO2 97%   BMI 42.35 kg/m   Physical Exam General:  NAD. No resp difficulty, walked into clinic HEENT: Normal Neck: Supple. JVP appears 10, thick neck. Carotids 2+ bilat; no bruits. No lymphadenopathy or thryomegaly appreciated. Cor: PMI nondisplaced. Regular rate & rhythm. No rubs, gallops or murmurs. Lungs: Clear Abdomen: Soft, obese, nontender, nondistended. No hepatosplenomegaly. No bruits or masses. Good bowel sounds. + RLQ urostomy Extremities: No cyanosis, clubbing, rash, edema Neuro: Alert & oriented x 3, cranial nerves grossly intact. Moves all 4 extremities w/o difficulty. Affect pleasant.  Assessment/Plan: 1. CAD: Prior PCI to RCA in 8/12.  LHC (9/17) with nonobstructive disease. He was started on Imdur for ?microvascular angina but he developed severe headaches without any improvement in symptoms.  Underwent coronary angiography (3/23) as part of AVR work up and showed patent RCA stents with nonobstructive mild disease. No chest pain now.  - Continue ASA 81 and Crestor.  2. Aortic stenosis: Bicuspid valve with severe AS on 3/23 echo.  He is now s/p bioprosthetic AV replacement and replacement of ascending aorta 5/23. Echo in 7/23 showed normal bioprosthetic aortic valve. Echo 8/24 showed stable bioprosthetic valve. 3. Hyperlipidemia: Continue Crestor.  4. Ascending aortic aneurysm: Associated with bicuspid aortic valve.  S/p replacement of ascending aorta 5/23.  5. Chronic diastolic CHF: Stable NYHA class II symptoms. Exam  difficult for volume, but JVP elevated and ReDs 41%. - We had previously stopped his SGLT2i. He has since restarted his Jardiance. I am going to reach out to his urologist for recs on continuing vs stopping. Ideally would like to continue for dHF and volume management. - Continue Lasix 40 mg bid + 20 KCL bid. Recent labs reviewed, SCr 1.96. With recent AKI, check BNP today to help guide diuresis. I will call him with Lasix instructions after labs result. - Continue carvedilol 3.125 mg bid.  - Avoid ibuprofen use, would use Tylenol for aches/pains.  6. OSA: Continue CPAP.  7. HTN: BP mildly elevated today. - Consider increasing amlodipine next visit. 8. Pericardial effusion: s/p pericardial window 6/23 post-op.  - Resolved on echo 8/24.  9. Atrial fibrillation: Paroxysmal, only noted post-op. Anticoagulation was stopped with pericardial effusion and later hematuria. NSR on ECG today. - If AF recurs, will need DOAC.  10. Metastatic Bladder CA: Treated with pembrolizumab. Now S/p cystoprostatectomy and lymph node dissection 03/03/23. Based on pathology results, oncology planning chemotherapy with gemcitabine and cisplatin +/- adjunct radiation therapy.   - Going for port placement next week.  Follow up in 4-6 weeks with APP for fluid check and follow renal function.  Anderson Malta [place], FNP-BC 05/24/2023

## 2023-05-23 NOTE — Progress Notes (Signed)
Patient ID: Luis [name], male   DOB: [birth year], 68 y.o.   MRN: [id number] PCP: Dr. Clent Ridges Cardiology: Dr. Shirlee Latch  CC: HF follow up  68 y.o. with history of CAD and moderate aortic stenosis presents for follow of AS and CAD. He had RCA PCI in 8/12. Metastatic bladder cancer previously treated with chemo and TURBT, now s/p cystoprostatectomy and lymph node dissection 11/24.  When I saw him in early 2016, he reported significant exertional dyspnea.  Did Lexiscan Cardiolite in 2/16.  This showed no ischemia or infarction.  Echo showed ?bicuspid aortic valve and probably moderate aortic stenosis. Given echo results RHC/LHC and TEE in 2/16. The RHC showed mildly elevated left and right heart filling pressures and the LHC showed nonobstructive CAD.  TEE confirmed bicuspid aortic valve with moderate AS. PFTs in 4/16 that were within normal limits. Started on Lasix 20 mg daily.  He seemed to improve.    Progressive dypnea in fall 2017.  RHC/LHC in 2017 showed mildly elevated left heart filling pressure and nonobstructive CAD.  TEE showed moderate aortic stenosis, bicuspid valve. Stable ascending aorta dilation.   Echo in 9/18 showed EF 60-65% with bicuspid aortic valve and moderate AS.  CTA chest showed 4.5 cm ascending aorta.  Repeat CTA chest in 8/19 showed stable 4.5 cm ascending aorta.  Echo in 8/20 showed EF 60-65%, moderate AS, 4.5 cm ascending aorta. CTA chest in 8/20 with 4.6 cm ascending aorta. Echo in 1/22 showed EF 60-65%, mild LVH, moderate AS with mean gradient 32 and AVA 1.6 cm^2, bicuspid aortic valve, ascending aorta 46 mm. CTA chest in 8/22 with 4.4 cm ascending aorta.   Echo 3/23 showed EF 60-65% with mild LVH, normal RV, bicuspid aortic valve with severe AS and mean gradient 48 mmHg, AVA 0.91 cm^2.  R/LHC showed mild CAD, patent RCA stents, normal PCWP, mildly elevated RA pressure, preserved CO.  Follow up 3/23, continued with exertional dyspnea. Referred to [facility] for evaluation of AVR,  arranged for [facility] as part of work up.   Underwent AV replacement and replacement of ascending aorta with Dr. Laneta Simmers 5/23. Had post-op atrial fibrillation, started on amiodarone/warfarin. Presented back to ED 1 week after discharge with LOC. Echo showed moderate pericardial effusion with maximal diameter of 3 cm over the right ventricle with some compression, LV and aortic valve function normal. Attempted pericardiocentesis in cath lab, but unsuccessful and placed sub-xiphoid pericardial window in OR, with 850 cc fluid removed. Discharged home, weight 275 lbs.  Echo 7/23 showed EF 60-65%, normal RV, stable bioprosthetic aortic valve.  Echo 8/24 showed EF 60-65%, normal RV, stable bioprosthetic aortic valve  He had hematuria and found to have recurrence of bladder cancer, now getting pembrolizumab infusions.   S/p cystoprostatectomy and lymph node dissection 03/03/23. Based on pathology results, Oncology considering chemotherapy with gemcitabine and cisplatin +/- adjunct radiation therapy.    Admitted 12/24 with sepsis & AKI. BCx grew Enterococcus faecalis. ID consulted and he required vasopressors and ICU care. Echo showed EF 65-70%, RV normal. TEE showed EF 60-65%, RV normal, no valvular vegetation or thrombus. He was discharged home with PICC on IV abx.  Today he returns for post hospital HF follow up with his wife. Overall feeling fine. He has finished his IV abx and PICC is out. He has SOB walking uphill, otherwise no dyspnea. Denies palpitations, abnormal bleeding, CP, dizziness, edema, or PND/Orthopnea. Appetite ok. No fever or chills. Weight at home 253 pounds. Taking all medications, he has restarted  his Jardiance since discharge. Has seen Rad Onc, planning chemo=>radiation, followed by more chemo. Will have port placed next Monday.  ReDs reading: 41%, abnormal  ECG (personally reviewed): NSR 61 bpm  Labs (6/23): K 4.1, creatinine 1.0, hgb 8.3, Lp(a) 116 Labs (10/23): K 4.0, creatinine  1.34, LDL 71 Labs (7/24): K 3.7, creatinine 1.10, hgb 14.5 Labs (9/24): K 3.7, creatinine 1.19 Labs (11/24): K 3.5, creatinine 0.92, hgb 10.1 Labs (1/25): K 4.2, creatinine 1.98  PMH: 1. OSA: Using CPAP.  2. CAD: Cardiolite 8/12 with inferior infarct and peri-infarct ischemia, had PCI to the mid and distal RCA with Promus DES x 2.  Lexiscan Cardiolite (2/16) with no ischemia or infarction. LHC/RHC (2/16) with mean RA 12, PA 32/15, mean PCWP 18, CI 3.47; patent mid and distal RCA stents, 50-60% proximal stenosis small PDA.   - LHC/RHC (9/17): 60% proximal PDA; mean RA 2, PA 24/8, mean PCWP 18, CI 2.13.  - Cardiolite (8/17): EF 56%, normal.  - R/LHC (3/23, AVR work up): mid RCA lesion 40% stenosis, previously placed prox RCA to Mid RCA stent (unknown type) widely patent, previously placed dist RCA stent (unknown type) widely patent; mean RA 13, PA 23/8, mean PCWP 11, CI 2.65.  3. Carotid stenosis: 2/15 carotid dopplers 1-39% bilateral ICA stenosis.  4. Colon polyps 5. HTN 6. Hyperlipidemia 7. Abdominal US (10/12) with no AAA 8. Aortic stenosis: Echo (2/15) with EF 60-65%, mild LVH, moderate diastolic dysfunction, normal RV size/systolic function, moderate aortic stenosis with mean gradient 31 and AVA 1.16 cm2, ascending aorta 4.4 cm.  Echo (2/16) with EF 60-65%, grade II diastolic dysfunction, ?bicuspid aortic valve with moderate AS (mean gradient 35 mmHg, AVA 1.5 cm^2), mildly dilated RV with normal systolic function.  TEE (2/16) with bicuspid aortic valve, moderate AS mean gradient 30 mmHg, AVA > 1 cm^2, EF 55-60%, mild LVH, mildly dilated RV with normal systolic function, mild AI, mild MR, ascending aorta 4.2 cm.  - Echo (8/17): EF 60-65%, grade II diastolic dysfunction, bicuspid aortic valve with mean gradient 31 mmHg, probably moderate stenosis.  - TEE (9/17): EF 55-60%, bicuspid aortic valve with moderate AS with mean gradient 24 mmHg and AVA 1.05 cm^2, ascending aorta 4.2 cm.  - Echo (9/18):  EF 60-65%, bicuspid aortic valve, AVA 1.05 cm^2 with mean gradient 24 mmHg (moderate AS).  - Echo (8/20): EF 60-65%, mild RV dilation with normal systolic function, moderate AS with AVA 1.46 cm^2 and mean gradient 36 mmHg, 4.5 cm ascending aorta.  - Echo (1/22): EF 60-65%, mild LVH, moderate AS with mean gradient 32 and AVA 1.6 cm^2, bicuspid aortic valve, ascending aorta 46 mm.  - Echo (3/23): EF 60-65% with mild LVH, normal RV, bicuspid aortic valve with severe AS and mean gradient 48 mmHg, AVA 0.91 cm^2. 4.5 cm ascending aorta.  - s/p bioprosthetic AVR with a 25 mm Edwards Resilia Aortic Valve and Replacement of Ascending Aorta (5/23). - Echo (7/23): EF 60-65%, normal RV, stable bioprosthetic aortic valve.  - Echo (8/24): EF 60-65%, normal RV, stable bioprosthetic valve - Echo (12/24): EF 60-65%, normal RV, stable bioprosthetic valve. - TEE (12/24): EF 60-65%, no vegetation or thrombus. 9. Ascending aorta aneurysm: 4.4 cm on 2/15 echo. Ascending aorta 4.2 cm TEE 2/16.  Ascending aorta 4.2 cm on 9/17 TEE.  - CTA chest (9/18): 4.5 cm ascending aorta.  - CTA chest (8/19): 4.5 cm ascending aorta - CTA chest (8/20): 4.6 cm ascending aorta - CTA chest (8/22): 4.4 cm ascending aorta -  replacement of ascending aorta 5/23. 10. Chronic diastolic CHF.  11. PFTs (4/16) were within normal limits.  12. Lower extremity arterial dopplers normal in 8/17.  13. Bladder cancer: S/p chemo and TURBT 14. Pericardial tamponade with pericardial window s/p AVR in 5/23.  15. Atrial fibrillation: Paroxysmal, noted post-op AVR.   SH: Quit smoking in 2013, married, works as Curator.  FH: Father with MI, AVR.  Sister with RyR2 gene.   ROS: All systems reviewed and negative except as per HPI.   Current Outpatient Medications  Medication Sig Dispense Refill   acetaminophen (TYLENOL) 325 MG tablet Take 650 mg by mouth every 6 (six) hours as needed (for pain).     amLODipine (NORVASC) 5 MG tablet Take 1 tablet (5 mg  total) by mouth daily. 90 tablet 1   aspirin EC 81 MG tablet Take 81 mg by mouth in the morning.     carvedilol (COREG) 3.125 MG tablet TAKE 1 TABLET BY MOUTH TWICE A DAY WITH FOOD 180 tablet 1   diazepam (VALIUM) 5 MG tablet TAKE 1 TABLET BY MOUTH EVERY 12 HOURS AS NEEDED FOR ANXIETY. (Patient taking differently: Take 5 mg by mouth every 12 (twelve) hours as needed for anxiety.) 60 tablet 5   ferrous sulfate 325 (65 FE) MG tablet Take 325 mg by mouth daily with breakfast.     furosemide (LASIX) 20 MG tablet TAKE 2 TABLETS (40 MG TOTAL) BY MOUTH EVERY MORNING AND 1 TABLET (20 MG TOTAL) EVERY EVENING. (Patient taking differently: Patient takes 40 mg by mouth twice a day.) 270 tablet 1   ipratropium (ATROVENT) 0.03 % nasal spray Place 2 sprays into both nostrils every 12 (twelve) hours as needed for rhinitis.     JARDIANCE 10 MG TABS tablet Take 1 tablet (10 mg total) by mouth daily. 30 tablet 11   lidocaine-prilocaine (EMLA) cream Apply 1 Application topically as needed. 30 g 0   Magnesium 500 MG TABS Take 500 mg by mouth in the morning.     meclizine (ANTIVERT) 25 MG tablet Take 1 tablet (25 mg total) by mouth 3 (three) times daily as needed for dizziness. 30 tablet 0   multivitamin-iron-minerals-folic acid (CENTRUM) chewable tablet Chew 1 tablet by mouth daily.     nitroGLYCERIN (NITROSTAT) 0.4 MG SL tablet Place 0.4 mg under the tongue every 5 (five) minutes as needed for chest pain.     ondansetron (ZOFRAN) 8 MG tablet Take 1 tablet (8 mg total) by mouth every 8 (eight) hours as needed. 30 tablet 0   potassium chloride SA (KLOR-CON M20) 20 MEQ tablet Take 2 tablets (40 mEq total) by mouth daily. (Patient taking differently: Take 20 mEq by mouth 2 (two) times daily.) 180 tablet 3   prochlorperazine (COMPAZINE) 10 MG tablet Take 1 tablet (10 mg total) by mouth every 6 (six) hours as needed for nausea or vomiting. 30 tablet 0   rosuvastatin (CRESTOR) 20 MG tablet TAKE 1 TABLET BY MOUTH EVERYDAY AT  BEDTIME (Patient taking differently: Take 20 mg by mouth at bedtime.) 90 tablet 3   venlafaxine XR (EFFEXOR-XR) 150 MG 24 hr capsule TAKE 1 CAPSULE BY MOUTH DAILY WITH BREAKFAST. 90 capsule 0   No current facility-administered medications for this encounter.   Wt Readings from Last 3 Encounters:  05/24/23 119 kg (262 lb 6.4 oz)  05/16/23 116.3 kg (256 lb 8 oz)  05/09/23 116.7 kg (257 lb 3.2 oz)   BP (!) 140/70   Pulse 64   Wt  119 kg (262 lb 6.4 oz)   SpO2 97%   BMI 42.35 kg/m   Physical Exam General:  NAD. No resp difficulty, walked into clinic HEENT: Normal Neck: Supple. JVP appears 10, thick neck. Carotids 2+ bilat; no bruits. No lymphadenopathy or thryomegaly appreciated. Cor: PMI nondisplaced. Regular rate & rhythm. No rubs, gallops or murmurs. Lungs: Clear Abdomen: Soft, obese, nontender, nondistended. No hepatosplenomegaly. No bruits or masses. Good bowel sounds. + RLQ urostomy Extremities: No cyanosis, clubbing, rash, edema Neuro: Alert & oriented x 3, cranial nerves grossly intact. Moves all 4 extremities w/o difficulty. Affect pleasant.  Assessment/Plan: 1. CAD: Prior PCI to RCA in 8/12.  LHC (9/17) with nonobstructive disease. He was started on Imdur for ?microvascular angina but he developed severe headaches without any improvement in symptoms.  Underwent coronary angiography (3/23) as part of AVR work up and showed patent RCA stents with nonobstructive mild disease. No chest pain now.  - Continue ASA 81 and Crestor.  2. Aortic stenosis: Bicuspid valve with severe AS on 3/23 echo.  He is now s/p bioprosthetic AV replacement and replacement of ascending aorta 5/23. Echo in 7/23 showed normal bioprosthetic aortic valve. Echo 8/24 showed stable bioprosthetic valve. 3. Hyperlipidemia: Continue Crestor.  4. Ascending aortic aneurysm: Associated with bicuspid aortic valve.  S/p replacement of ascending aorta 5/23.  5. Chronic diastolic CHF: Stable NYHA class II symptoms. Exam  difficult for volume, but JVP elevated and ReDs 41%. - We had previously stopped his SGLT2i. He has since restarted his Jardiance. I am going to reach out to his urologist for recs on continuing vs stopping. Ideally would like to continue for dHF and volume management. - Continue Lasix 40 mg bid + 20 KCL bid. Recent labs reviewed, SCr 1.96. With recent AKI, check BNP today to help guide diuresis. I will call him with Lasix instructions after labs result. - Continue carvedilol 3.125 mg bid.  - Avoid ibuprofen use, would use Tylenol for aches/pains.  6. OSA: Continue CPAP.  7. HTN: BP mildly elevated today. - Consider increasing amlodipine next visit. 8. Pericardial effusion: s/p pericardial window 6/23 post-op.  - Resolved on echo 8/24.  9. Atrial fibrillation: Paroxysmal, only noted post-op. Anticoagulation was stopped with pericardial effusion and later hematuria. NSR on ECG today. - If AF recurs, will need DOAC.  10. Metastatic Bladder CA: Treated with pembrolizumab. Now S/p cystoprostatectomy and lymph node dissection 03/03/23. Based on pathology results, oncology planning chemotherapy with gemcitabine and cisplatin +/- adjunct radiation therapy.   - Going for port placement next week.  Follow up in 4-6 weeks with APP for fluid check and follow renal function.  Anderson Malta [place], FNP-BC 05/24/2023

## 2023-05-24 ENCOUNTER — Other Ambulatory Visit: Payer: Self-pay

## 2023-05-24 ENCOUNTER — Encounter (HOSPITAL_COMMUNITY): Payer: Self-pay

## 2023-05-24 ENCOUNTER — Ambulatory Visit (HOSPITAL_COMMUNITY)
Admission: RE | Admit: 2023-05-24 | Discharge: 2023-05-24 | Disposition: A | Discharge status: Home / Self Care | Payer: PPO | Source: Ambulatory Visit | Attending: Family Medicine | Admitting: Family Medicine

## 2023-05-24 VITALS — BP 140/70 | HR 64 | Wt 262.4 lb

## 2023-05-24 DIAGNOSIS — I2510 Atherosclerotic heart disease of native coronary artery without angina pectoris: Secondary | ICD-10-CM | POA: Diagnosis not present

## 2023-05-24 DIAGNOSIS — I7121 Aneurysm of the ascending aorta, without rupture: Secondary | ICD-10-CM | POA: Diagnosis not present

## 2023-05-24 DIAGNOSIS — I480 Paroxysmal atrial fibrillation: Secondary | ICD-10-CM

## 2023-05-24 DIAGNOSIS — G4733 Obstructive sleep apnea (adult) (pediatric): Secondary | ICD-10-CM

## 2023-05-24 DIAGNOSIS — E785 Hyperlipidemia, unspecified: Secondary | ICD-10-CM

## 2023-05-24 DIAGNOSIS — Z79899 Other long term (current) drug therapy: Secondary | ICD-10-CM | POA: Insufficient documentation

## 2023-05-24 DIAGNOSIS — Z952 Presence of prosthetic heart valve: Secondary | ICD-10-CM

## 2023-05-24 DIAGNOSIS — I350 Nonrheumatic aortic (valve) stenosis: Secondary | ICD-10-CM | POA: Diagnosis present

## 2023-05-24 DIAGNOSIS — C679 Malignant neoplasm of bladder, unspecified: Secondary | ICD-10-CM | POA: Diagnosis not present

## 2023-05-24 DIAGNOSIS — C774 Secondary and unspecified malignant neoplasm of inguinal and lower limb lymph nodes: Secondary | ICD-10-CM | POA: Diagnosis not present

## 2023-05-24 DIAGNOSIS — C775 Secondary and unspecified malignant neoplasm of intrapelvic lymph nodes: Secondary | ICD-10-CM

## 2023-05-24 DIAGNOSIS — I110 Hypertensive heart disease with heart failure: Secondary | ICD-10-CM | POA: Insufficient documentation

## 2023-05-24 DIAGNOSIS — I5032 Chronic diastolic (congestive) heart failure: Secondary | ICD-10-CM

## 2023-05-24 DIAGNOSIS — I10 Essential (primary) hypertension: Secondary | ICD-10-CM

## 2023-05-24 LAB — BRAIN NATRIURETIC PEPTIDE: B Natriuretic Peptide: 56.5 pg/mL (ref 0.0–100.0)

## 2023-05-24 LAB — BASIC METABOLIC PANEL
Anion gap: 11 (ref 5–15)
BUN: 26 mg/dL — ABNORMAL HIGH (ref 8–23)
CO2: 23 mmol/L (ref 22–32)
Calcium: 8.5 mg/dL — ABNORMAL LOW (ref 8.9–10.3)
Chloride: 104 mmol/L (ref 98–111)
Creatinine, Ser: 2.08 mg/dL — ABNORMAL HIGH (ref 0.61–1.24)
GFR, Estimated: 34 mL/min — ABNORMAL LOW (ref >60)
Glucose, Bld: 177 mg/dL — ABNORMAL HIGH (ref 70–99)
Potassium: 4.3 mmol/L (ref 3.5–5.1)
Sodium: 138 mmol/L (ref 135–145)

## 2023-05-24 NOTE — Addendum Note (Signed)
Encounter addended by: Faythe Casa, CMA on: 05/24/2023 3:21 PM  Actions taken: Order list changed, Diagnosis association updated, Flowsheet accepted, Clinical Note Signed, Charge Capture section accepted

## 2023-05-24 NOTE — Patient Instructions (Signed)
No change in medications. Labs today - will call you if abnormal. Return to [facility] in 4 - 6 weeks. See below. Please call us at [phone] if any questions or concerns prior to your next visit.

## 2023-05-24 NOTE — Progress Notes (Signed)
ReDS Vest / Clip - 05/24/23 1500       ReDS Vest / Clip   Station Marker D    Ruler Value 32.5    ReDS Value Range High volume overload    ReDS Actual Value 41

## 2023-05-25 ENCOUNTER — Other Ambulatory Visit: Payer: Self-pay | Admitting: Radiology

## 2023-05-25 ENCOUNTER — Other Ambulatory Visit: Payer: Self-pay

## 2023-05-26 ENCOUNTER — Telehealth (HOSPITAL_COMMUNITY): Payer: Self-pay

## 2023-05-26 ENCOUNTER — Other Ambulatory Visit: Payer: Self-pay | Admitting: Radiology

## 2023-05-26 DIAGNOSIS — I5032 Chronic diastolic (congestive) heart failure: Secondary | ICD-10-CM

## 2023-05-26 DIAGNOSIS — Z79899 Other long term (current) drug therapy: Secondary | ICD-10-CM

## 2023-05-26 MED ORDER — FUROSEMIDE 20 MG PO TABS: 40.0000 mg | ORAL_TABLET | Freq: Every day | ORAL | Status: DC

## 2023-05-26 MED ORDER — POTASSIUM CHLORIDE CRYS ER 20 MEQ PO TBCR: 20.0000 meq | EXTENDED_RELEASE_TABLET | Freq: Every day | ORAL | Status: DC

## 2023-05-26 NOTE — Telephone Encounter (Signed)
-----   Message from Anderson Malta [place] sent at 05/26/2023 12:45 PM EST ----- Renal function remains elevated.  Hold Lasix and KCL x 3 days, after 3 days, resume Lasix at lower dose of 40 mg daily + 20 KCL daily. Repeat BMET in 7-10 days

## 2023-05-26 NOTE — Telephone Encounter (Signed)
Spoke with patient regarding the following results. Patient made aware and patient verbalized understanding.   Patient will hold lasix and KCL for 3 days and then resume at lower dose as suggested- medication list updated. Repeat bmet ordered and scheduled.   Advised patient to call back to office with any issues, questions, or concerns. Patient verbalized understanding.

## 2023-05-26 NOTE — H&P (Signed)
Chief Complaint: Patient was seen in consultation today for bladder cancer.  Referring Physician(s): Dorsey,John T IV  Supervising Physician: Oley Balm  Patient Status: [facility] - Out-pt  History of Present Illness: Luis [name] is a 68 y.o. male with a past medical history significant for anxiety, depression, RLS, vertigo, GERD, ascending aortic aneurysm, CAD, CHF, HTN, HLD, MI and high risk non-muscle invasive bladder cancer who presents today for port placement. [name] was first diagnosed with bladder cancer in 2021 s/p multiple rounds of immunotherapy with several episodes of disease recurrence and recent cystectomy/prostatectomy with ileal conduit creation 03/03/23. Biopsies taken during cystectomy/prostatectomy showed metastatic urothelial cancer in one of two left common iliac lymph nodes. He is followed by urology and oncology regularly and is planned to undergo chemotherapy. He has been referred to IR for port placement for durable venous access.  Past Medical History:  Diagnosis Date   Anxiety    takes Valium as needed   Aortic stenosis, moderate    Arthritis    Ascending aortic aneurysm (HCC)    CAD (coronary artery disease)    a. s/p PCI of the RCA 8/12 with DES by Dr Excell Seltzer, preserved EF. b. LHC/RHC (2/16) with mean RA 12, PA 32/15, mean PCWP 18, CI 3.47; patent mid and distal RCA stents, 50-60% proximal stenosis small PDA.      [facility])    bladder   Carotid stenosis    a. Carotid US (05/2013):  Bilateral 1-39% ICA; L thyroid nodule (prior hx of aspiration).   Chronic diastolic CHF (congestive heart failure) (HCC)    Complication of anesthesia    difficulty waking up after gallbladder surgery   Depression    Dyspnea    Essential hypertension    GERD (gastroesophageal reflux disease)    if needed will take OTC meds    Heart murmur    History of colonic polyps    hyperplastic   Hyperlipidemia    Joint pain    Lesion of bladder    Myocardial  infarction (HCC) 2012   Obesity (BMI 30-39.9) 02/29/2016   Pre-diabetes    Restless leg    Sleep apnea    uses cpap   Tubular adenoma of colon    Vertigo    takes Meclizine as needed    Past Surgical History:  Procedure Laterality Date   AORTIC VALVE REPLACEMENT N/A 09/16/2021   Procedure: AORTIC VALVE REPLACEMENT (AVR);  Surgeon: Alleen Borne, MD;  Location: [facility] OR;  Service: Open Heart Surgery;  Laterality: N/A;   CATARACT EXTRACTION   4 YRS AGO   BOTH EYES   CHOLECYSTECTOMY  07/21/2011   Procedure: LAPAROSCOPIC CHOLECYSTECTOMY WITH INTRAOPERATIVE CHOLANGIOGRAM;  Surgeon: Kandis Cocking, MD;  Location: [facility];  Service: General;  Laterality: N/A;   CORONARY ANGIOPLASTY  2012   2 stents   coronary stenting     s/p PCI of the RCA by Dr Excell Seltzer 8/12 with 2 promus stents   CYSTOSCOPY W/ RETROGRADES Bilateral 12/13/2019   Procedure: CYSTOSCOPY WITH RETROGRADE PYELOGRAM;  Surgeon: Sebastian Ache, MD;  Location: [facility];  Service: Urology;  Laterality: Bilateral;   CYSTOSCOPY W/ RETROGRADES Bilateral 10/14/2020   Procedure: CYSTOSCOPY WITH RETROGRADE PYELOGRAM;  Surgeon: Sebastian Ache, MD;  Location: [facility];  Service: Urology;  Laterality: Bilateral;   CYSTOSCOPY W/ RETROGRADES Bilateral 12/16/2020   Procedure: CYSTOSCOPY WITH RETROGRADE PYELOGRAM;  Surgeon: Sebastian Ache, MD;  Location: [facility];  Service: Urology;  Laterality: Bilateral;   CYSTOSCOPY W/ RETROGRADES Bilateral 06/03/2022   Procedure: CYSTOSCOPY WITH RETROGRADE PYELOGRAM;  Surgeon: Sebastian Ache, MD;  Location: [facility];  Service: Urology;  Laterality: Bilateral;   CYSTOSCOPY W/ RETROGRADES Bilateral 12/28/2022   Procedure: CYSTOSCOPY WITH RETROGRADE PYELOGRAM, FULGARATION OF BLEEDERS;  Surgeon: Loletta Parish., MD;  Location: [facility];  Service: Urology;  Laterality: Bilateral;   CYSTOSCOPY WITH INJECTION N/A 03/03/2023   Procedure: CYSTOSCOPY WITH INDOCYANINE  INJECTION;  Surgeon: Loletta Parish., MD;  Location: [facility];  Service: Urology;  Laterality: N/A;  360 MINUTES   LEFT AND RIGHT HEART CATHETERIZATION WITH CORONARY ANGIOGRAM N/A 06/23/2014   Procedure: LEFT AND RIGHT HEART CATHETERIZATION WITH CORONARY ANGIOGRAM;  Surgeon: Laurey Morale, MD;  Location: [facility] CATH LAB;  Service: Cardiovascular;  Laterality: N/A;   NECK SURGERY  03/23/09   per Dr. Ophelia Charter, cervical fusion    PERICARDIOCENTESIS N/A 09/28/2021   Procedure: PERICARDIOCENTESIS;  Surgeon: Corky Crafts, MD;  Location: [facility] INVASIVE CV LAB;  Service: Cardiovascular;  Laterality: N/A;   REPLACEMENT ASCENDING AORTA N/A 09/16/2021   Procedure: REPLACEMENT ASCENDING AORTA WITH 30 X HEMASHIELD PLATINUM WOVEN DOUBLE VELOUR VASCULAR GRAFT;  Surgeon: Alleen Borne, MD;  Location: [facility] OR;  Service: Open Heart Surgery;  Laterality: N/A;  CIRC ARREST   right elbow surgery     RIGHT HEART CATH AND CORONARY ANGIOGRAPHY N/A 07/08/2021   Procedure: RIGHT HEART CATH AND CORONARY ANGIOGRAPHY;  Surgeon: Laurey Morale, MD;  Location: [facility] INVASIVE CV LAB;  Service: Cardiovascular;  Laterality: N/A;   ROBOT ASSISTED LAPAROSCOPIC COMPLETE CYSTECT ILEAL CONDUIT N/A 03/03/2023   Procedure: XI ROBOTIC ASSISTED LAPAROSCOPIC COMPLETE CYSTECTECTOMY WITH  ILEAL CONDUIT DIVERSION;  Surgeon: Loletta Parish., MD;  Location: [facility];  Service: Urology;  Laterality: N/A;   ROBOT ASSISTED LAPAROSCOPIC RADICAL PROSTATECTOMY N/A 03/03/2023   Procedure: XI ROBOTIC ASSISTED LAPAROSCOPIC RADICAL PROSTATECTOMY WITH LYMPH NODE DISSECTION;  Surgeon: Loletta Parish., MD;  Location: [facility];  Service: Urology;  Laterality: N/A;   solonscopy  05/23/08   per Dr. Celene Kras hemorrhoids only, repeat in 5 years   SUBXYPHOID PERICARDIAL WINDOW N/A 09/28/2021   Procedure: SUBXYPHOID PERICARDIAL WINDOW;  Surgeon: Alleen Borne, MD;  Location: [facility] OR;  Service: Thoracic;  Laterality: N/A;   TEE WITHOUT CARDIOVERSION  N/A 06/23/2014   Procedure: TRANSESOPHAGEAL ECHOCARDIOGRAM (TEE);  Surgeon: Laurey Morale, MD;  Location: [facility] ENDOSCOPY;  Service: Cardiovascular;  Laterality: N/A;   TEE WITHOUT CARDIOVERSION N/A 01/21/2016   Procedure: TRANSESOPHAGEAL ECHOCARDIOGRAM (TEE);  Surgeon: Laurey Morale, MD;  Location: [facility] ENDOSCOPY;  Service: Cardiovascular;  Laterality: N/A;   TEE WITHOUT CARDIOVERSION N/A 09/16/2021   Procedure: TRANSESOPHAGEAL ECHOCARDIOGRAM (TEE);  Surgeon: Alleen Borne, MD;  Location: [facility] OR;  Service: Open Heart Surgery;  Laterality: N/A;   TEE WITHOUT CARDIOVERSION N/A 09/28/2021   Procedure: TRANSESOPHAGEAL ECHOCARDIOGRAM (TEE);  Surgeon: Alleen Borne, MD;  Location: [facility] OR;  Service: Thoracic;  Laterality: N/A;   TONSILLECTOMY     TRANSESOPHAGEAL ECHOCARDIOGRAM (CATH LAB) N/A 04/17/2023   Procedure: TRANSESOPHAGEAL ECHOCARDIOGRAM;  Surgeon: Jake Bathe, MD;  Location: [facility] INVASIVE CV LAB;  Service: Cardiovascular;  Laterality: N/A;   TRANSURETHRAL RESECTION OF BLADDER TUMOR N/A 10/21/2019   Procedure: TRANSURETHRAL RESECTION OF BLADDER TUMOR (TURBT);  Surgeon: Ihor Gully, MD;  Location: [facility];  Service: Urology;  Laterality: N/A;   TRANSURETHRAL RESECTION OF BLADDER TUMOR N/A 12/13/2019   Procedure: TRANSURETHRAL RESECTION  OF BLADDER TUMOR (TURBT);  Surgeon: Sebastian Ache, MD;  Location: [facility];  Service: Urology;  Laterality: N/A;  1 HR   TRANSURETHRAL RESECTION OF BLADDER TUMOR N/A 10/14/2020   Procedure: TRANSURETHRAL RESECTION OF BLADDER TUMOR (TURBT);  Surgeon: Sebastian Ache, MD;  Location: [facility];  Service: Urology;  Laterality: N/A;   TRANSURETHRAL RESECTION OF BLADDER TUMOR N/A 12/16/2020   Procedure: RESTAGING TRANSURETHRAL RESECTION OF BLADDER TUMOR (TURBT);  Surgeon: Sebastian Ache, MD;  Location: [facility];  Service: Urology;  Laterality: N/A;   TRANSURETHRAL RESECTION OF BLADDER TUMOR N/A  06/03/2022   Procedure: TRANSURETHRAL RESECTION OF BLADDER TUMOR (TURBT);  Surgeon: Sebastian Ache, MD;  Location: [facility];  Service: Urology;  Laterality: N/A;   UMBILICAL HERNIA REPAIR  03/03/2023   Procedure: HERNIA REPAIR UMBILICAL;  Surgeon: Loletta Parish., MD;  Location: [facility];  Service: Urology;;    Allergies: Patient has no known allergies.  Medications: Prior to Admission medications   Medication Sig Start Date End Date Taking? Authorizing Provider  acetaminophen (TYLENOL) 325 MG tablet Take 650 mg by mouth every 6 (six) hours as needed (for pain).    [provider]  amLODipine (NORVASC) 5 MG tablet Take 1 tablet (5 mg total) by mouth daily. 05/16/23   Jaci Standard, MD  aspirin EC 81 MG tablet Take 81 mg by mouth in the morning.    [provider]  carvedilol (COREG) 3.125 MG tablet TAKE 1 TABLET BY MOUTH TWICE A DAY WITH FOOD 05/10/23   Laurey Morale, MD  diazepam (VALIUM) 5 MG tablet TAKE 1 TABLET BY MOUTH EVERY 12 HOURS AS NEEDED FOR ANXIETY. Patient taking differently: Take 5 mg by mouth every 12 (twelve) hours as needed for anxiety. 03/29/23   Nelwyn Salisbury, MD  ferrous sulfate 325 (65 FE) MG tablet Take 325 mg by mouth daily with breakfast.    [provider]  furosemide (LASIX) 20 MG tablet TAKE 2 TABLETS (40 MG TOTAL) BY MOUTH EVERY MORNING AND 1 TABLET (20 MG TOTAL) EVERY EVENING. Patient taking differently: Patient takes 40 mg by mouth twice a day. 05/09/23   Milford, Anderson Malta, FNP  ipratropium (ATROVENT) 0.03 % nasal spray Place 2 sprays into both nostrils every 12 (twelve) hours as needed for rhinitis.    [provider]  JARDIANCE 10 MG TABS tablet Take 1 tablet (10 mg total) by mouth daily. 05/10/23   Laurey Morale, MD  lidocaine-prilocaine (EMLA) cream Apply 1 Application topically as needed. 05/16/23   Jaci Standard, MD  Magnesium 500 MG TABS Take 500 mg by mouth in the morning.    [provider]   meclizine (ANTIVERT) 25 MG tablet Take 1 tablet (25 mg total) by mouth 3 (three) times daily as needed for dizziness. 09/09/22   Nelwyn Salisbury, MD  multivitamin-iron-minerals-folic acid (CENTRUM) chewable tablet Chew 1 tablet by mouth daily. 09/23/21   Barrett, Erin R, PA-C  nitroGLYCERIN (NITROSTAT) 0.4 MG SL tablet Place 0.4 mg under the tongue every 5 (five) minutes as needed for chest pain. 11/03/17 12/05/25  [provider]  ondansetron (ZOFRAN) 8 MG tablet Take 1 tablet (8 mg total) by mouth every 8 (eight) hours as needed. 05/16/23   Jaci Standard, MD  potassium chloride SA (KLOR-CON M20) 20 MEQ tablet Take 2 tablets (40 mEq total) by mouth daily. Patient taking differently: Take 20 mEq by mouth 2 (two) times daily.  04/05/23   Laurey Morale, MD  prochlorperazine (COMPAZINE) 10 MG tablet Take 1 tablet (10 mg total) by mouth every 6 (six) hours as needed for nausea or vomiting. 05/16/23   Jaci Standard, MD  rosuvastatin (CRESTOR) 20 MG tablet TAKE 1 TABLET BY MOUTH EVERYDAY AT BEDTIME Patient taking differently: Take 20 mg by mouth at bedtime. 02/06/23   Jacklynn Ganong, FNP  venlafaxine XR (EFFEXOR-XR) 150 MG 24 hr capsule TAKE 1 CAPSULE BY MOUTH DAILY WITH BREAKFAST. 05/11/23   Nelwyn Salisbury, MD     Family History  Problem Relation Age of Onset   Lung cancer Mother        lung   Esophageal cancer Cousin    Colon cancer Neg Hx    Rectal cancer Neg Hx    Stomach cancer Neg Hx     Social History   Socioeconomic History   Marital status: Married    Spouse name: Not on file   Number of children: 4   Years of education: Not on file   Highest education level: Not on file  Occupational History   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  Tobacco Use   Smoking status: Former    Current packs/day: 0.00    Average packs/day: 2.0 packs/day for 40.0 years (80.0 ttl pk-yrs)    Types: Cigarettes    Start date: 43    Quit date: 2012     Years since quitting: 13.0   Smokeless tobacco: Never  Vaping Use   Vaping status: Never Used  Substance and Sexual Activity   Alcohol use: No    Alcohol/week: 0.0 standard drinks of alcohol   Drug use: No   Sexual activity: Not on file  Other Topics Concern   Not on file  Social History Narrative   Not on file   Social Drivers of Health   Financial Resource Strain: Low Risk  (11/03/2022)   Overall Financial Resource Strain (CARDIA)    Difficulty of Paying Living Expenses: Not hard at all  Food Insecurity: No Food Insecurity (05/09/2023)   Hunger Vital Sign    Worried About Running Out of Food in the Last Year: Never true    Ran Out of Food in the Last Year: Never true  Transportation Needs: No Transportation Needs (05/09/2023)   PRAPARE - Administrator, Civil Service (Medical): No    Lack of Transportation (Non-Medical): No  Physical Activity: Unknown (11/03/2022)   Exercise Vital Sign    Days of Exercise per Week: Patient unable to answer    Minutes of Exercise per Session: 30 min  Stress: No Stress Concern Present (11/03/2022)   [employer] of Occupational Health - Occupational Stress Questionnaire    Feeling of Stress : Not at all  Social Connections: Moderately Isolated (11/03/2022)   Social Connection and Isolation Panel [NHANES]    Frequency of Communication with Friends and Family: More than three times a week    Frequency of Social Gatherings with Friends and Family: More than three times a week    Attends Religious Services: Never    Database administrator or Organizations: No    Attends Banker Meetings: Never    Marital Status: Married     Review of Systems: A 12 point ROS discussed and pertinent positives are indicated in the HPI above.  All other systems are negative.  Review of Systems  Vital Signs: There were no vitals  taken for this visit.  Physical Exam       Imaging: CT Chest W Contrast Result Date:  05/18/2023 CLINICAL DATA:  Bladder cancer, staging.  * Tracking Code: BO * EXAM: CT CHEST WITH CONTRAST TECHNIQUE: Multidetector CT imaging of the chest was performed during intravenous contrast administration. RADIATION DOSE REDUCTION: This exam was performed according to the departmental dose-optimization program which includes automated exposure control, adjustment of the mA and/or kV according to patient size and/or use of iterative reconstruction technique. CONTRAST:  75mL OMNIPAQUE IOHEXOL 300 MG/ML  SOLN COMPARISON:  Chest CT 04/18/2023 and 12/14/2022. FINDINGS: Cardiovascular: Stable postsurgical changes status post median sternotomy, aortic valve replacement and aortic root grafting. There is atherosclerosis of the aorta, great vessels and coronary arteries. No acute vascular findings are identified. The heart size is normal. There is no pericardial effusion. Mediastinum/Nodes: There are no enlarged mediastinal, hilar or axillary lymph nodes. The thyroid gland, trachea and esophagus demonstrate no significant findings. Lungs/Pleura: No pleural effusion or pneumothorax. The lungs are clear. No confluent airspace disease or suspicious pulmonary nodularity. Upper abdomen: Hepatic steatosis and prior cholecystectomy. No acute or suspicious findings identified in the visualized upper abdomen. Musculoskeletal/Chest wall: There is no chest wall mass or suspicious osseous finding. Mild symmetric bilateral gynecomastia. Previous lower cervical fusion and median sternotomy. Multilevel spondylosis. IMPRESSION: 1. No evidence of metastatic disease in the chest. 2. Stable postsurgical changes status post median sternotomy, aortic valve replacement and aortic root grafting. 3. Hepatic steatosis. 4. Coronary and aortic Atherosclerosis (ICD10-I70.0). Electronically Signed   By: Carey Bullocks M.D.   On: 05/18/2023 12:00    Labs:  CBC: Recent Labs    04/18/23 0543 04/20/23 1438 05/16/23 0932 05/23/23 1327  WBC  6.0 6.0 7.9 6.9  HGB 7.8* 8.8* 10.3* 9.3*  HCT 24.9* 28.6* 31.9* 28.5*  PLT 154 204 184 196    COAGS: Recent Labs    04/13/23 1024  INR 1.3*  APTT 38*    BMP: Recent Labs    04/25/23 1525 05/16/23 0932 05/23/23 1327 05/24/23 1430  NA 139 139 138 138  K 4.2 4.2 3.9 4.3  CL 104 104 104 104  CO2 25 25 25 23   GLUCOSE 96 126* 161* 177*  BUN 11 26* 26* 26*  CALCIUM 8.4* 8.9 8.8* 8.5*  CREATININE 1.51* 1.98* 1.91* 2.08*  GFRNONAA 50* 36* 37* 34*    LIVER FUNCTION TESTS: Recent Labs    04/13/23 1024 04/20/23 1438 05/16/23 0932 05/23/23 1327  BILITOT 1.0 0.4 0.4 0.5  AST 36 24 19 21   ALT 32 30 14 19   ALKPHOS 136* 103 94 99  PROT 8.9* 7.3 8.1 8.1  ALBUMIN 3.2* 3.2* 4.1 3.9    TUMOR MARKERS: No results for input(s): "AFPTM", "CEA", "CA199", "CHROMGRNA" in the last 8760 hours.  Assessment and Plan:  68 y/o M with history of high risk nonmuscle invasive bladder cancer s/p cystectomy/prostatectomy with ileal conduit creation who presents today for port placement prior to initiation of chemotherapy.  Risks and benefits of image-guided Port-a-catheter placement were discussed with the patient including, but not limited to bleeding, infection, pneumothorax, or fibrin sheath development and need for additional procedures.  All of the patient's questions were answered, patient is agreeable to proceed.  Consent signed and in chart.  Thank you for this interesting consult.  I greatly enjoyed meeting Luis [name] and look forward to participating in their care.  A copy of this report was sent to the requesting provider on this  date.  Electronically Signed: Villa Herb, PA-C 05/26/2023, 9:43 AM   I spent a total of 30 Minutes  in face to face in clinical consultation, greater than 50% of which was counseling/coordinating care for bladder cancer.

## 2023-05-29 ENCOUNTER — Other Ambulatory Visit: Payer: Self-pay

## 2023-05-29 ENCOUNTER — Ambulatory Visit (HOSPITAL_COMMUNITY)
Admission: RE | Admit: 2023-05-29 | Discharge: 2023-05-29 | Disposition: A | Discharge status: Home / Self Care | Payer: PPO | Source: Ambulatory Visit | Attending: Hematology and Oncology

## 2023-05-29 ENCOUNTER — Encounter (HOSPITAL_COMMUNITY): Payer: Self-pay

## 2023-05-29 DIAGNOSIS — F419 Anxiety disorder, unspecified: Secondary | ICD-10-CM | POA: Diagnosis not present

## 2023-05-29 DIAGNOSIS — F32A Depression, unspecified: Secondary | ICD-10-CM | POA: Diagnosis not present

## 2023-05-29 DIAGNOSIS — C679 Malignant neoplasm of bladder, unspecified: Secondary | ICD-10-CM | POA: Insufficient documentation

## 2023-05-29 DIAGNOSIS — I110 Hypertensive heart disease with heart failure: Secondary | ICD-10-CM | POA: Diagnosis not present

## 2023-05-29 DIAGNOSIS — Z87891 Personal history of nicotine dependence: Secondary | ICD-10-CM | POA: Diagnosis not present

## 2023-05-29 DIAGNOSIS — K219 Gastro-esophageal reflux disease without esophagitis: Secondary | ICD-10-CM | POA: Diagnosis not present

## 2023-05-29 DIAGNOSIS — Z79899 Other long term (current) drug therapy: Secondary | ICD-10-CM | POA: Diagnosis not present

## 2023-05-29 DIAGNOSIS — I252 Old myocardial infarction: Secondary | ICD-10-CM | POA: Insufficient documentation

## 2023-05-29 DIAGNOSIS — G2581 Restless legs syndrome: Secondary | ICD-10-CM | POA: Insufficient documentation

## 2023-05-29 DIAGNOSIS — I2510 Atherosclerotic heart disease of native coronary artery without angina pectoris: Secondary | ICD-10-CM | POA: Diagnosis not present

## 2023-05-29 DIAGNOSIS — E785 Hyperlipidemia, unspecified: Secondary | ICD-10-CM | POA: Diagnosis not present

## 2023-05-29 DIAGNOSIS — I5032 Chronic diastolic (congestive) heart failure: Secondary | ICD-10-CM | POA: Diagnosis not present

## 2023-05-29 DIAGNOSIS — I7121 Aneurysm of the ascending aorta, without rupture: Secondary | ICD-10-CM | POA: Insufficient documentation

## 2023-05-29 HISTORY — PX: IR IMAGING GUIDED PORT INSERTION: IMG5740

## 2023-05-29 MED ORDER — SODIUM CHLORIDE 0.9 % IV SOLN: INTRAVENOUS | Status: DC

## 2023-05-29 MED ORDER — LIDOCAINE-EPINEPHRINE 1 %-1:100000 IJ SOLN
INTRAMUSCULAR | Status: AC
  Filled 2023-05-29: qty 1

## 2023-05-29 MED ORDER — HEPARIN SOD (PORK) LOCK FLUSH 100 UNIT/ML IV SOLN
500.0000 [IU] | Freq: Once | INTRAVENOUS | Status: AC
  Administered 2023-05-29: 500 [IU] via INTRAVENOUS

## 2023-05-29 MED ORDER — FENTANYL CITRATE (PF) 100 MCG/2ML IJ SOLN
INTRAMUSCULAR | Status: DC | PRN
  Administered 2023-05-29 (×2): 25 ug via INTRAVENOUS
  Administered 2023-05-29: 50 ug via INTRAVENOUS

## 2023-05-29 MED ORDER — HEPARIN SOD (PORK) LOCK FLUSH 100 UNIT/ML IV SOLN
INTRAVENOUS | Status: AC
Start: 2023-05-29 — End: ?
  Filled 2023-05-29: qty 5

## 2023-05-29 MED ORDER — FENTANYL CITRATE (PF) 100 MCG/2ML IJ SOLN
INTRAMUSCULAR | Status: AC
  Filled 2023-05-29: qty 2

## 2023-05-29 MED ORDER — LIDOCAINE-EPINEPHRINE 1 %-1:100000 IJ SOLN
20.0000 mL | Freq: Once | INTRAMUSCULAR | Status: AC
  Administered 2023-05-29: 20 mL via INTRADERMAL

## 2023-05-29 MED ORDER — MIDAZOLAM HCL 2 MG/2ML IJ SOLN
INTRAMUSCULAR | Status: AC
  Filled 2023-05-29: qty 2

## 2023-05-29 MED ORDER — MIDAZOLAM HCL 2 MG/2ML IJ SOLN
INTRAMUSCULAR | Status: DC | PRN
  Administered 2023-05-29: .5 mg via INTRAVENOUS
  Administered 2023-05-29 (×2): 1 mg via INTRAVENOUS
  Administered 2023-05-29: .5 mg via INTRAVENOUS

## 2023-05-29 NOTE — Procedures (Signed)
  Procedure:  R internal jugular port catheter placement   Preprocedure diagnosis: The encounter diagnosis was Malignant neoplasm of urinary bladder, unspecified site (HCC). Postprocedure diagnosis: same EBL:    minimal Complications:   none immediate  See full dictation in [employer].  Thora Lance MD Main # [phone] Pager  [phone] Mobile [phone]

## 2023-05-29 NOTE — Discharge Instructions (Signed)
 Please call Interventional Radiology clinic [phone] with any questions or concerns.  You may remove your dressing and shower tomorrow.  After the procedure, it is common to have: Discomfort at the port insertion site. Bruising on the skin over the port. This should improve over 3-4 days  Follow these instructions at home:  Medication: Do not use Aspirin or ibuprofen products, such as Advil or Motrin, as it may increase bleeding.  You may resume your usual medications as ordered by your doctor. If your doctor prescribed antibiotics, take them as directed. Do not stop taking them just because you feel better. You need to take the full course of antibiotics.  Eating and drinking: Drink plenty of liquids to keep your urine pale yellow You can resume your regular diet as directed by your doctor   Care of the procedure site Follow instructions from your health care provider about how to take care of your port insertion site. Make sure you: After your port is placed, you will get a manufacturer's information card. The card has information about your port. Keep this card with you at all times Make sure to remember what type of port you have Take care of the port as told by your health care provider DO NOT use EMLA cream for 2 weeks after port placement -the cream will remove the surgical glue on your incision DO NOT use any lotions, creams, or ointments on incision for 2 weeks. This will remove the surgical glue on your incision Wash your hands with soap and water before and after you change your bandage (dressing). If soap and water are not available, use hand sanitizer Change your dressing as told by your health care provider Leave skin glue, or adhesive strips in place. These skin closures may need to stay in place for 2 weeks or longer Check your port insertion site every day for signs of infection. Check for: Redness, swelling, or pain Fluid or blood Warmth Pus or a bad  smell  Activity Return to your normal activities as told by your health care provider. Ask your health care provider what activities are safe for you Do not lift anything that is heavier than 10 lb (4.5 kg), or the limit that you are told, until your health care provider says that it is safe Do not take baths, swim, or use a hot tub until your health care provider approves. Take showers only. Keep all follow-up visits as told by your doctor  Contact a health care provider if: You cannot flush your port with saline as directed, or you cannot draw blood from the port You have a fever or chills You have redness, swelling, or pain around your port insertion site You have fluid or blood coming from your port insertion site Your port insertion site feels warm to the touch You have pus or a bad smell coming from the port insertion site  Get help right away if: You have chest pain or shortness of breath You have bleeding from your port that you cannot control  Moderate Conscious Sedation-Care After  This sheet gives you information about how to care for yourself after your procedure. Your health care provider may also give you more specific instructions. If you have problems or questions, contact your health care provider.  After the procedure, it is common to have: Sleepiness for several hours. Impaired judgment for several hours. Difficulty with balance. Vomiting if you eat too soon.  Follow these instructions at home:  Rest. Do not  participate in activities where you could fall or become injured. Do not drive or use machinery. Do not drink alcohol. Do not take sleeping pills or medicines that cause drowsiness. Do not make important decisions or sign legal documents. Do not take care of children on your own.  Eating and drinking Follow the diet recommended by your health care provider. Drink enough fluid to keep your urine pale yellow. If you vomit: Drink water, juice, or soup  when you can drink without vomiting. Make sure you have little or no nausea before eating solid foods.  General instructions Take over-the-counter and prescription medicines only as told by your health care provider. Have a responsible adult stay with you for the time you are told. It is important to have someone help care for you until you are awake and alert. Do not smoke. Keep all follow-up visits as told by your health care provider. This is important.  Contact a health care provider if: You are still sleepy or having trouble with balance after 24 hours. You feel light-headed. You keep feeling nauseous or you keep vomiting. You develop a rash. You have a fever. You have redness or swelling around the IV site.  Get help right away if: You have trouble breathing. You have new-onset confusion at home.  This information is not intended to replace advice given to you by your health care provider. Make sure you discuss any questions you have with your healthcare provider.

## 2023-05-30 ENCOUNTER — Inpatient Hospital Stay (HOSPITAL_BASED_OUTPATIENT_CLINIC_OR_DEPARTMENT_OTHER): Payer: PPO | Admitting: Physician Assistant

## 2023-05-30 ENCOUNTER — Inpatient Hospital Stay: Payer: PPO | Attending: Hematology and Oncology

## 2023-05-30 ENCOUNTER — Other Ambulatory Visit: Payer: PPO

## 2023-05-30 ENCOUNTER — Inpatient Hospital Stay: Payer: PPO

## 2023-05-30 VITALS — BP 144/62 | HR 62 | Temp 98.2°F | Resp 13 | Wt 264.1 lb

## 2023-05-30 VITALS — BP 138/81 | HR 54 | Resp 16

## 2023-05-30 DIAGNOSIS — Z800 Family history of malignant neoplasm of digestive organs: Secondary | ICD-10-CM | POA: Diagnosis not present

## 2023-05-30 DIAGNOSIS — Z87891 Personal history of nicotine dependence: Secondary | ICD-10-CM | POA: Insufficient documentation

## 2023-05-30 DIAGNOSIS — Z95828 Presence of other vascular implants and grafts: Secondary | ICD-10-CM | POA: Insufficient documentation

## 2023-05-30 DIAGNOSIS — R21 Rash and other nonspecific skin eruption: Secondary | ICD-10-CM | POA: Diagnosis not present

## 2023-05-30 DIAGNOSIS — R7989 Other specified abnormal findings of blood chemistry: Secondary | ICD-10-CM

## 2023-05-30 DIAGNOSIS — D649 Anemia, unspecified: Secondary | ICD-10-CM | POA: Insufficient documentation

## 2023-05-30 DIAGNOSIS — C673 Malignant neoplasm of anterior wall of bladder: Secondary | ICD-10-CM | POA: Diagnosis not present

## 2023-05-30 DIAGNOSIS — C679 Malignant neoplasm of bladder, unspecified: Secondary | ICD-10-CM

## 2023-05-30 DIAGNOSIS — C775 Secondary and unspecified malignant neoplasm of intrapelvic lymph nodes: Secondary | ICD-10-CM | POA: Diagnosis not present

## 2023-05-30 DIAGNOSIS — N179 Acute kidney failure, unspecified: Secondary | ICD-10-CM | POA: Insufficient documentation

## 2023-05-30 DIAGNOSIS — C671 Malignant neoplasm of dome of bladder: Secondary | ICD-10-CM | POA: Insufficient documentation

## 2023-05-30 LAB — CMP (CANCER CENTER ONLY)
ALT: 14 U/L (ref 0–44)
AST: 14 U/L — ABNORMAL LOW (ref 15–41)
Albumin: 3.6 g/dL (ref 3.5–5.0)
Alkaline Phosphatase: 88 U/L (ref 38–126)
Anion gap: 8 (ref 5–15)
BUN: 21 mg/dL (ref 8–23)
CO2: 23 mmol/L (ref 22–32)
Calcium: 8.2 mg/dL — ABNORMAL LOW (ref 8.9–10.3)
Chloride: 106 mmol/L (ref 98–111)
Creatinine: 2.12 mg/dL — ABNORMAL HIGH (ref 0.61–1.24)
GFR, Estimated: 33 mL/min — ABNORMAL LOW (ref >60)
Glucose, Bld: 174 mg/dL — ABNORMAL HIGH (ref 70–99)
Potassium: 4.1 mmol/L (ref 3.5–5.1)
Sodium: 137 mmol/L (ref 135–145)
Total Bilirubin: 0.4 mg/dL (ref 0.0–1.2)
Total Protein: 7.5 g/dL (ref 6.5–8.1)

## 2023-05-30 LAB — CBC WITH DIFFERENTIAL (CANCER CENTER ONLY)
Abs Immature Granulocytes: 0.05 10*3/uL (ref 0.00–0.07)
Basophils Absolute: 0 10*3/uL (ref 0.0–0.1)
Basophils Relative: 0 %
Eosinophils Absolute: 0.2 10*3/uL (ref 0.0–0.5)
Eosinophils Relative: 3 %
HCT: 26.6 % — ABNORMAL LOW (ref 39.0–52.0)
Hemoglobin: 8.5 g/dL — ABNORMAL LOW (ref 13.0–17.0)
Immature Granulocytes: 1 %
Lymphocytes Relative: 16 %
Lymphs Abs: 1.2 10*3/uL (ref 0.7–4.0)
MCH: 28.3 pg (ref 26.0–34.0)
MCHC: 32 g/dL (ref 30.0–36.0)
MCV: 88.7 fL (ref 80.0–100.0)
Monocytes Absolute: 0.5 10*3/uL (ref 0.1–1.0)
Monocytes Relative: 7 %
Neutro Abs: 5.8 10*3/uL (ref 1.7–7.7)
Neutrophils Relative %: 73 %
Platelet Count: 211 10*3/uL (ref 150–400)
RBC: 3 MIL/uL — ABNORMAL LOW (ref 4.22–5.81)
RDW: 17.6 % — ABNORMAL HIGH (ref 11.5–15.5)
WBC Count: 7.8 10*3/uL (ref 4.0–10.5)
nRBC: 0 % (ref 0.0–0.2)

## 2023-05-30 LAB — MAGNESIUM: Magnesium: 2.3 mg/dL (ref 1.7–2.4)

## 2023-05-30 MED ORDER — SODIUM CHLORIDE 0.9 % IV SOLN
INTRAVENOUS | Status: AC
Start: 2023-05-30 — End: 2023-05-30

## 2023-05-30 MED ORDER — SODIUM CHLORIDE 0.9% FLUSH
10.0000 mL | Freq: Once | INTRAVENOUS | Status: AC
Start: 2023-05-30 — End: 2023-05-30
  Administered 2023-05-30: 10 mL

## 2023-05-30 MED ORDER — HEPARIN SOD (PORK) LOCK FLUSH 100 UNIT/ML IV SOLN
500.0000 [IU] | Freq: Once | INTRAVENOUS | Status: AC
  Administered 2023-05-30: 500 [IU]

## 2023-05-30 MED ORDER — SODIUM CHLORIDE 0.9% FLUSH
10.0000 mL | Freq: Once | INTRAVENOUS | Status: AC
  Administered 2023-05-30: 10 mL

## 2023-05-30 NOTE — Patient Instructions (Addendum)

## 2023-05-30 NOTE — Progress Notes (Signed)
Per [name], PA- patient to have fluids today to help resolve his kidney issues. Chemotherapy next week.

## 2023-05-30 NOTE — Progress Notes (Signed)
 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 Federico  08/12/2022: Cycle 1 of Pembrolizumab  09/02/2022: Cycle 2 of Pembrolizumab   09/23/2022: Cycle 3 of Pembrolizumab  10/14/2022: Cycle 4 of Pembrolizumab  11/04/2022: Cycle 5 of Pembrolizumab  11/25/2022: Cycle 6 of Pembrolizumab  12/20/2022: Cycle 7 of Pembrolizumab  01/13/2023: Cycle 8 of Pembrolizumab   03/03/2023: Cystectomy/Prostatectomy showed infiltrative high-grade urothelial carcinoma, size 5.8 cm involving bladder dome, anterior wall and prostatic stroma. Tumor invades directly into prostatic stroma at apex and mid portion of the gland (pT4a). Metastatic urothelial cancer in one of two left common iliac lymph nodes.  05/30/2023: Cycle 1 Day 1 of Gem/Cis deferred due to rising creatinine levels and anemia.   CHIEF COMPLAINTS/PURPOSE OF CONSULTATION:  High Risk Non-Muscle Invasive Bladder Cancer   HISTORY OF PRESENTING ILLNESS:  Luis [name] 68 y.o. 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 He presents today with  his wife to start cycle 1, day 1 of gemcitabine /cisplatin.   On exam today Luis [name] reports he is overall stable without any new or concerning symptoms since the last visit. His energy levels are fairly stable. He has a good appetite. He denies nausea, vomiting or bowel habit changes. He denies any bruising or bleeding episodes. He temporarily stopped his lasix  medication and resumed at 40 mg daily. He denies fevers, chills, sweats, shortness of breath, chest pain, cough, headaches, dizziness or peripheral edema.He has no other complaints. A full 10 point ROS is otherwise negative.  MEDICAL HISTORY:  Past Medical History:  Diagnosis Date   Anxiety    takes Valium  as needed   Aortic stenosis, moderate    Arthritis    Ascending aortic aneurysm (HCC)    CAD (coronary artery disease)    a. s/p PCI of the RCA 8/12 with DES by Dr Wonda, preserved EF. b. LHC/RHC (2/16) with mean RA 12, PA 32/15, mean PCWP 18, CI 3.47; patent mid and distal RCA stents, 50-60% proximal stenosis small PDA.      Cancer [facility])    bladder   Carotid stenosis    a. Carotid US  (05/2013):  Bilateral 1-39% ICA; L thyroid  nodule (prior hx of aspiration).   Chronic diastolic CHF (congestive heart failure) (HCC)    Complication of anesthesia    difficulty waking up after gallbladder surgery   Depression    Dyspnea    Essential hypertension    GERD (gastroesophageal reflux disease)    if needed will take OTC meds    Heart murmur    History of colonic polyps    hyperplastic   Hyperlipidemia    Joint pain    Lesion of bladder    Myocardial infarction (HCC) 2012   Obesity (BMI 30-39.9) 02/29/2016  Pre-diabetes    Restless leg    Sleep apnea    uses cpap   Tubular adenoma of colon    Vertigo    takes Meclizine  as needed    SURGICAL HISTORY: Past Surgical History:  Procedure Laterality Date   AORTIC VALVE REPLACEMENT N/A 09/16/2021   Procedure: AORTIC VALVE REPLACEMENT (AVR);  Surgeon: Lucas Dorise POUR, MD;   Location: [facility] OR;  Service: Open Heart Surgery;  Laterality: N/A;   CATARACT EXTRACTION   4 YRS AGO   BOTH EYES   CHOLECYSTECTOMY  07/21/2011   Procedure: LAPAROSCOPIC CHOLECYSTECTOMY WITH INTRAOPERATIVE CHOLANGIOGRAM;  Surgeon: Alm VEAR Angle, MD;  Location: [facility] ORS;  Service: General;  Laterality: N/A;   CORONARY ANGIOPLASTY  2012   2 stents   coronary stenting     s/p PCI of the RCA by Dr Wonda 8/12 with 2 promus stents   CYSTOSCOPY W/ RETROGRADES Bilateral 12/13/2019   Procedure: CYSTOSCOPY WITH RETROGRADE PYELOGRAM;  Surgeon: Alvaro Hummer, MD;  Location: [facility];  Service: Urology;  Laterality: Bilateral;   CYSTOSCOPY W/ RETROGRADES Bilateral 10/14/2020   Procedure: CYSTOSCOPY WITH RETROGRADE PYELOGRAM;  Surgeon: Alvaro Hummer, MD;  Location: [facility];  Service: Urology;  Laterality: Bilateral;   CYSTOSCOPY W/ RETROGRADES Bilateral 12/16/2020   Procedure: CYSTOSCOPY WITH RETROGRADE PYELOGRAM;  Surgeon: Alvaro Hummer, MD;  Location: [facility];  Service: Urology;  Laterality: Bilateral;   CYSTOSCOPY W/ RETROGRADES Bilateral 06/03/2022   Procedure: CYSTOSCOPY WITH RETROGRADE PYELOGRAM;  Surgeon: Alvaro Hummer, MD;  Location: [facility];  Service: Urology;  Laterality: Bilateral;   CYSTOSCOPY W/ RETROGRADES Bilateral 12/28/2022   Procedure: CYSTOSCOPY WITH RETROGRADE PYELOGRAM, FULGARATION OF BLEEDERS;  Surgeon: Alvaro Hummer KATHEE Mickey., MD;  Location: [facility];  Service: Urology;  Laterality: Bilateral;   CYSTOSCOPY WITH INJECTION N/A 03/03/2023   Procedure: CYSTOSCOPY WITH INDOCYANINE INJECTION;  Surgeon: Alvaro Hummer KATHEE Mickey., MD;  Location: [facility];  Service: Urology;  Laterality: N/A;  360 MINUTES   IR IMAGING GUIDED PORT INSERTION  05/29/2023   LEFT AND RIGHT HEART CATHETERIZATION WITH CORONARY ANGIOGRAM N/A 06/23/2014   Procedure: LEFT AND RIGHT HEART CATHETERIZATION WITH CORONARY ANGIOGRAM;  Surgeon: Ezra GORMAN Shuck, MD;  Location: [facility] CATH LAB;   Service: Cardiovascular;  Laterality: N/A;   NECK SURGERY  03/23/09   per Dr. Barbarann, cervical fusion    PERICARDIOCENTESIS N/A 09/28/2021   Procedure: PERICARDIOCENTESIS;  Surgeon: Dann Candyce GORMAN, MD;  Location: [facility] INVASIVE CV LAB;  Service: Cardiovascular;  Laterality: N/A;   REPLACEMENT ASCENDING AORTA N/A 09/16/2021   Procedure: REPLACEMENT ASCENDING AORTA WITH 30 X HEMASHIELD PLATINUM WOVEN DOUBLE VELOUR VASCULAR GRAFT;  Surgeon: Lucas Dorise POUR, MD;  Location: [facility] OR;  Service: Open Heart Surgery;  Laterality: N/A;  CIRC ARREST   right elbow surgery     RIGHT HEART CATH AND CORONARY ANGIOGRAPHY N/A 07/08/2021   Procedure: RIGHT HEART CATH AND CORONARY ANGIOGRAPHY;  Surgeon: Shuck Ezra GORMAN, MD;  Location: [facility] INVASIVE CV LAB;  Service: Cardiovascular;  Laterality: N/A;   ROBOT ASSISTED LAPAROSCOPIC COMPLETE CYSTECT ILEAL CONDUIT N/A 03/03/2023   Procedure: XI ROBOTIC ASSISTED LAPAROSCOPIC COMPLETE CYSTECTECTOMY WITH  ILEAL CONDUIT DIVERSION;  Surgeon: Alvaro Hummer KATHEE Mickey., MD;  Location: [facility];  Service: Urology;  Laterality: N/A;   ROBOT ASSISTED LAPAROSCOPIC RADICAL PROSTATECTOMY N/A 03/03/2023   Procedure: XI ROBOTIC ASSISTED LAPAROSCOPIC RADICAL PROSTATECTOMY WITH LYMPH NODE DISSECTION;  Surgeon: Alvaro Hummer KATHEE Mickey., MD;  Location: [facility];  Service: Urology;  Laterality: N/A;   solonscopy  05/23/08   per Dr. Jakie inch hemorrhoids only, repeat in 5 years   SUBXYPHOID PERICARDIAL WINDOW N/A 09/28/2021   Procedure: SUBXYPHOID PERICARDIAL WINDOW;  Surgeon: Lucas Dorise POUR, MD;  Location: [facility] OR;  Service: Thoracic;  Laterality: N/A;   TEE WITHOUT CARDIOVERSION N/A 06/23/2014   Procedure: TRANSESOPHAGEAL ECHOCARDIOGRAM (TEE);  Surgeon: Ezra GORMAN Shuck, MD;  Location: [facility] ENDOSCOPY;  Service: Cardiovascular;  Laterality: N/A;   TEE WITHOUT CARDIOVERSION N/A 01/21/2016   Procedure: TRANSESOPHAGEAL ECHOCARDIOGRAM (TEE);  Surgeon: Ezra GORMAN Shuck, MD;  Location: [facility] ENDOSCOPY;  Service:  Cardiovascular;  Laterality: N/A;   TEE WITHOUT CARDIOVERSION N/A 09/16/2021   Procedure: TRANSESOPHAGEAL ECHOCARDIOGRAM (TEE);  Surgeon: Lucas Dorise POUR, MD;  Location: [facility] OR;  Service: Open Heart Surgery;  Laterality: N/A;   TEE WITHOUT CARDIOVERSION N/A 09/28/2021   Procedure: TRANSESOPHAGEAL ECHOCARDIOGRAM (TEE);  Surgeon: Lucas Dorise POUR, MD;  Location: [facility] OR;  Service: Thoracic;  Laterality: N/A;   TONSILLECTOMY     TRANSESOPHAGEAL ECHOCARDIOGRAM (CATH LAB) N/A 04/17/2023   Procedure: TRANSESOPHAGEAL ECHOCARDIOGRAM;  Surgeon: Jeffrie Oneil BROCKS, MD;  Location: [facility] INVASIVE CV LAB;  Service: Cardiovascular;  Laterality: N/A;   TRANSURETHRAL RESECTION OF BLADDER TUMOR N/A 10/21/2019   Procedure: TRANSURETHRAL RESECTION OF BLADDER TUMOR (TURBT);  Surgeon: Ottelin, Mark, MD;  Location: [facility]  Service: Urology;  Laterality: N/A;   TRANSURETHRAL RESECTION OF BLADDER TUMOR N/A 12/13/2019   Procedure: TRANSURETHRAL RESECTION OF BLADDER TUMOR (TURBT);  Surgeon: Alvaro Hummer, MD;  Location: [facility];  Service: Urology;  Laterality: N/A;  1 HR   TRANSURETHRAL RESECTION OF BLADDER TUMOR N/A 10/14/2020   Procedure: TRANSURETHRAL RESECTION OF BLADDER TUMOR (TURBT);  Surgeon: Alvaro Hummer, MD;  Location: [facility];  Service: Urology;  Laterality: N/A;   TRANSURETHRAL RESECTION OF BLADDER TUMOR N/A 12/16/2020   Procedure: RESTAGING TRANSURETHRAL RESECTION OF BLADDER TUMOR (TURBT);  Surgeon: Alvaro Hummer, MD;  Location: [facility];  Service: Urology;  Laterality: N/A;   TRANSURETHRAL RESECTION OF BLADDER TUMOR N/A 06/03/2022   Procedure: TRANSURETHRAL RESECTION OF BLADDER TUMOR (TURBT);  Surgeon: Alvaro Hummer, MD;  Location: [facility];  Service: Urology;  Laterality: N/A;   UMBILICAL HERNIA REPAIR  03/03/2023   Procedure: HERNIA REPAIR UMBILICAL;  Surgeon: Alvaro Hummer KATHEE Mickey., MD;  Location: [facility];  Service: Urology;;    SOCIAL  HISTORY: Social History   Socioeconomic History   Marital status: Married    Spouse name: Not on file   Number of children: 4   Years of education: Not on file   Highest education level: Not on file  Occupational History   Occupation: Manufacturing Engineer    Employer: [employer]  Tobacco Use   Smoking status: Former    Current packs/day: 0.00    Average packs/day: 2.0 packs/day for 40.0 years (80.0 ttl pk-yrs)    Types: Cigarettes    Start date: 50    Quit date: 2012    Years since quitting: 13.1    Passive exposure: Never   Smokeless tobacco: Never  Vaping Use   Vaping status: Never Used  Substance and Sexual Activity   Alcohol use: No    Alcohol/week: 0.0 standard drinks of alcohol   Drug use: No   Sexual activity: Not on file  Other Topics Concern   Not on file  Social History Narrative   Not on file   Social Drivers of Health   Financial Resource Strain:  Low Risk  (11/03/2022)   Overall Financial Resource Strain (CARDIA)    Difficulty of Paying Living Expenses: Not hard at all  Food Insecurity: No Food Insecurity (05/09/2023)   Hunger Vital Sign    Worried About Running Out of Food in the Last Year: Never true    Ran Out of Food in the Last Year: Never true  Transportation Needs: No Transportation Needs (05/09/2023)   PRAPARE - Administrator, Civil Service (Medical): No    Lack of Transportation (Non-Medical): No  Physical Activity: Unknown (11/03/2022)   Exercise Vital Sign    Days of Exercise per Week: Patient unable to answer    Minutes of Exercise per Session: 30 min  Stress: No Stress Concern Present (11/03/2022)   [employer] of Occupational Health - Occupational Stress Questionnaire    Feeling of Stress : Not at all  Social Connections: Moderately Isolated (11/03/2022)   Social Connection and Isolation Panel [NHANES]    Frequency of Communication with Friends and Family: More than three times a week    Frequency of Social  Gatherings with Friends and Family: More than three times a week    Attends Religious Services: Never    Database Administrator or Organizations: No    Attends Banker Meetings: Never    Marital Status: Married  Catering Manager Violence: Not At Risk (05/09/2023)   Humiliation, Afraid, Rape, and Kick questionnaire    Fear of Current or Ex-Partner: No    Emotionally Abused: No    Physically Abused: No    Sexually Abused: No    FAMILY HISTORY: Family History  Problem Relation Age of Onset   Lung cancer Mother        lung   Esophageal cancer Cousin    Colon cancer Neg Hx    Rectal cancer Neg Hx    Stomach cancer Neg Hx     ALLERGIES:  has no known allergies.  MEDICATIONS:  Current Outpatient Medications  Medication Sig Dispense Refill   acetaminophen  (TYLENOL ) 325 MG tablet Take 650 mg by mouth every 6 (six) hours as needed (for pain).     amLODipine  (NORVASC ) 5 MG tablet Take 1 tablet (5 mg total) by mouth daily. 90 tablet 1   aspirin  EC 81 MG tablet Take 81 mg by mouth in the morning.     carvedilol  (COREG ) 3.125 MG tablet TAKE 1 TABLET BY MOUTH TWICE A DAY WITH FOOD 180 tablet 1   diazepam  (VALIUM ) 5 MG tablet TAKE 1 TABLET BY MOUTH EVERY 12 HOURS AS NEEDED FOR ANXIETY. (Patient taking differently: Take 5 mg by mouth every 12 (twelve) hours as needed for anxiety.) 60 tablet 5   ferrous sulfate  325 (65 FE) MG tablet Take 325 mg by mouth daily with breakfast.     furosemide  (LASIX ) 20 MG tablet Take 2 tablets (40 mg total) by mouth daily.     ipratropium (ATROVENT ) 0.03 % nasal spray Place 2 sprays into both nostrils every 12 (twelve) hours as needed for rhinitis.     JARDIANCE  10 MG TABS tablet Take 1 tablet (10 mg total) by mouth daily. 30 tablet 11   Magnesium  500 MG TABS Take 500 mg by mouth in the morning.     meclizine  (ANTIVERT ) 25 MG tablet Take 1 tablet (25 mg total) by mouth 3 (three) times daily as needed for dizziness. 30 tablet 0    multivitamin-iron-minerals-folic acid  (CENTRUM) chewable tablet Chew 1 tablet by mouth daily.  nitroGLYCERIN  (NITROSTAT ) 0.4 MG SL tablet Place 0.4 mg under the tongue every 5 (five) minutes as needed for chest pain.     ondansetron  (ZOFRAN ) 8 MG tablet Take 1 tablet (8 mg total) by mouth every 8 (eight) hours as needed. 30 tablet 0   potassium chloride  SA (KLOR-CON  M20) 20 MEQ tablet Take 1 tablet (20 mEq total) by mouth daily.     prochlorperazine  (COMPAZINE ) 10 MG tablet Take 1 tablet (10 mg total) by mouth every 6 (six) hours as needed for nausea or vomiting. 30 tablet 0   rosuvastatin  (CRESTOR ) 20 MG tablet TAKE 1 TABLET BY MOUTH EVERYDAY AT BEDTIME (Patient taking differently: Take 20 mg by mouth at bedtime.) 90 tablet 3   venlafaxine  XR (EFFEXOR -XR) 150 MG 24 hr capsule TAKE 1 CAPSULE BY MOUTH DAILY WITH BREAKFAST. 90 capsule 0   lidocaine -prilocaine  (EMLA ) cream Apply 1 Application topically as needed. (Patient not taking: Reported on 05/30/2023) 30 g 0   No current facility-administered medications for this visit.    REVIEW OF SYSTEMS:   Constitutional: ( - ) fevers, ( - )  chills , ( - ) night sweats Eyes: ( - ) blurriness of vision, ( - ) double vision, ( - ) watery eyes Ears, nose, mouth, throat, and face: ( - ) mucositis, ( - ) sore throat Respiratory: ( - ) cough, ( - ) dyspnea, ( - ) wheezes Cardiovascular: ( - ) palpitation, ( - ) chest discomfort, ( - ) lower extremity swelling Gastrointestinal:  ( - ) nausea, ( - ) heartburn, ( - ) change in bowel habits Skin: ( - ) abnormal skin rashes Lymphatics: ( - ) new lymphadenopathy, ( - ) easy bruising Neurological: ( - ) numbness, ( - ) tingling, ( - ) new weaknesses Behavioral/Psych: ( - ) mood change, ( - ) new changes  All other systems were reviewed with the patient and are negative.  PHYSICAL EXAMINATION: ECOG PERFORMANCE STATUS: 1 - Symptomatic but completely ambulatory  Vitals:   05/30/23 0803  BP: (!) 144/62  Pulse:  62  Resp: 13  Temp: 98.2 F (36.8 C)  SpO2: 99%    Filed Weights   05/30/23 0803  Weight: 264 lb 1.6 oz (119.8 kg)     GENERAL: well appearing elderly Caucasian male in NAD  SKIN: skin color, texture, turgor are normal, no rashes or significant lesions EYES: conjunctiva are pink and non-injected, sclera clear LUNGS: clear to auscultation and percussion with normal breathing effort HEART: regular rate & rhythm and no murmurs and no lower extremity edema Musculoskeletal: no cyanosis of digits and no clubbing  PSYCH: alert & oriented x 3, fluent speech NEURO: no focal motor/sensory deficits  LABORATORY DATA:  I have reviewed the data as listed    Latest Ref Rng & Units 05/30/2023    7:26 AM 05/23/2023    1:27 PM 05/16/2023    9:32 AM  CBC  WBC 4.0 - 10.5 K/uL 7.8  6.9  7.9   Hemoglobin 13.0 - 17.0 g/dL 8.5  9.3  89.6   Hematocrit 39.0 - 52.0 % 26.6  28.5  31.9   Platelets 150 - 400 K/uL 211  196  184        Latest Ref Rng & Units 05/30/2023    7:26 AM 05/24/2023    2:30 PM 05/23/2023    1:27 PM  CMP  Glucose 70 - 99 mg/dL 825  822  838   BUN 8 - 23 mg/dL  21  26  26    Creatinine 0.61 - 1.24 mg/dL 7.87  7.91  8.08   Sodium 135 - 145 mmol/L 137  138  138   Potassium 3.5 - 5.1 mmol/L 4.1  4.3  3.9   Chloride 98 - 111 mmol/L 106  104  104   CO2 22 - 32 mmol/L 23  23  25    Calcium  8.9 - 10.3 mg/dL 8.2  8.5  8.8   Total Protein 6.5 - 8.1 g/dL 7.5   8.1   Total Bilirubin 0.0 - 1.2 mg/dL 0.4   0.5   Alkaline Phos 38 - 126 U/L 88   99   AST 15 - 41 U/L 14   21   ALT 0 - 44 U/L 14   19     RADIOGRAPHIC STUDIES: IR IMAGING GUIDED PORT INSERTION Result Date: 05/29/2023 CLINICAL DATA:  Bladder carcinoma, needs durable venous access for planned treatment regimen. EXAM: TUNNELED PORT CATHETER PLACEMENT WITH ULTRASOUND AND FLUOROSCOPIC GUIDANCE FLUOROSCOPY: Radiation Exposure Index (as provided by the fluoroscopic device): 27 mGy air Kerma ANESTHESIA/SEDATION: Intravenous Fentanyl   100mcg and Versed  3mg  were administered by RN during a total moderate (conscious) sedation time of 19 minutes; the patient's level of consciousness and physiological / cardiorespiratory status were monitored continuously by radiology RN under my direct supervision. TECHNIQUE: The procedure, risks, benefits, and alternatives were explained to the patient. Questions regarding the procedure were encouraged and answered. The patient understands and consents to the procedure. Patency of the right IJ vein was confirmed with ultrasound with image documentation. An appropriate skin site was determined. Skin site was marked. Region was prepped using maximum barrier technique including cap and mask, sterile gown, sterile gloves, large sterile sheet, and Chlorhexidine  as cutaneous antisepsis. The region was infiltrated locally with 1% lidocaine . Under real-time ultrasound guidance, the right IJ vein was accessed with a 21 gauge micropuncture needle; the needle tip within the vein was confirmed with ultrasound image documentation. Needle was exchanged over a 018 guidewire for transitional dilator, and vascular measurement was performed. A small incision was made on the right anterior chest wall and a subcutaneous pocket fashioned. The power-injectable port was positioned and its catheter tunneled to the right IJ dermatotomy site. The transitional dilator was exchanged over an Amplatz wire for a peel-away sheath, through which the port catheter, which had been trimmed to the appropriate length, was advanced and positioned under fluoroscopy with its tip at the cavoatrial junction. Spot chest radiograph confirms good catheter position and no pneumothorax. The port was flushed per protocol. The pocket was closed with deep interrupted and subcuticular continuous 3-0 Monocryl sutures. The incisions were covered with Dermabond then covered with a sterile dressing. The patient tolerated the procedure well. COMPLICATIONS: COMPLICATIONS  None immediate IMPRESSION: Technically successful right IJ power-injectable port catheter placement. Ready for routine use. Electronically Signed   By: JONETTA Faes M.D.   On: 05/29/2023 11:39   CT Chest W Contrast Result Date: 05/18/2023 CLINICAL DATA:  Bladder cancer, staging.  * Tracking Code: BO * EXAM: CT CHEST WITH CONTRAST TECHNIQUE: Multidetector CT imaging of the chest was performed during intravenous contrast administration. RADIATION DOSE REDUCTION: This exam was performed according to the departmental dose-optimization program which includes automated exposure control, adjustment of the mA and/or kV according to patient size and/or use of iterative reconstruction technique. CONTRAST:  75mL OMNIPAQUE  IOHEXOL  300 MG/ML  SOLN COMPARISON:  Chest CT 04/18/2023 and 12/14/2022. FINDINGS: Cardiovascular: Stable postsurgical changes status post median sternotomy,  aortic valve replacement and aortic root grafting. There is atherosclerosis of the aorta, great vessels and coronary arteries. No acute vascular findings are identified. The heart size is normal. There is no pericardial effusion. Mediastinum/Nodes: There are no enlarged mediastinal, hilar or axillary lymph nodes. The thyroid  gland, trachea and esophagus demonstrate no significant findings. Lungs/Pleura: No pleural effusion or pneumothorax. The lungs are clear. No confluent airspace disease or suspicious pulmonary nodularity. Upper abdomen: Hepatic steatosis and prior cholecystectomy. No acute or suspicious findings identified in the visualized upper abdomen. Musculoskeletal/Chest wall: There is no chest wall mass or suspicious osseous finding. Mild symmetric bilateral gynecomastia. Previous lower cervical fusion and median sternotomy. Multilevel spondylosis. IMPRESSION: 1. No evidence of metastatic disease in the chest. 2. Stable postsurgical changes status post median sternotomy, aortic valve replacement and aortic root grafting. 3. Hepatic steatosis.  4. Coronary and aortic Atherosclerosis (ICD10-I70.0). Electronically Signed   By: Elsie Perone M.D.   On: 05/18/2023 12:00    ASSESSMENT & PLAN Luis [name] 68 y.o. 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  After review of the labs, review of the records, and discussion with the patient the patients findings are most consistent with BCG unresponsive high risk non-muscle invasive bladder cancer, not a candidate for cystectomy.  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 21 days until progression or intolerance up to 24 months. --Received 8 cycles of Pembrolizumab  on 08/12/2022-01/13/2023: Cycle 8 of Pembrolizumab   --On 03/03/2023, underwent Cystectomy/Prostatectomy showed infiltrative high-grade urothelial carcinoma, size 5.8 cm involving bladder dome, anterior wall and prostatic stroma. Tumor invades directly into prostatic stroma at apex and mid portion of the gland (pT4a). Metastatic urothelial cancer in one of two left common iliac lymph nodes.  -- Given the results of the pathology showing positive margins and spread to the lymph node we are considering radiation therapy versus chemotherapy.  For chemotherapy, recommend gemcitabine  and cisplatin x 4 cycles.  This would consist of gemcitabine  1000 mg/m on days 1 and 8 and cisplatin 70 mg/m on day 1 of 21-day cycle for 4 cycles. PLAN:  --Due for Cycle 1, Day 1 of Gemcitabine  plus Cisplatin --Labs from today were reviewed with patient. WBC 7.8, Hgb 8.5, Plt 211, Creatinine 2.12, LFTs in range.  --Due to rising creatinine levels and anemia, recommend to defer start of treatment to next week --RTC next week with labs and follow up before Cycle 1, Day 1 of Gem/Cis  #AKI -- Creatinine levels increased to 2.12 today (it was 1.36 on  04/20/2023).  -- Discussed lasix  dosage with cardiology NP who recommended to decrease lasix  to 20 mg PO daily since he is not complaining of SOB or edema. Awaiting recommendations from patient's nephrologist.  -- Will give IV fluids today today, 150 ml/hr over 3 hours -- Will order US  renal to evaluate for any obstructive etiologies.   # Abdominal Rash--improved -- Small circular rash, approximately 3 inches in diameter. -- Patient has access to hydrocortisone 5% at home, recommend he apply that to the lesion 1-2 times daily to see if there is improvement.  #Supportive Care -- chemotherapy education to be scheduled  -- port placement to be scheduled.  -- zofran  8mg  q8H PRN and compazine  10mg  PO q6H for nausea -- EMLA  cream for port -- no pain medication required at this time.  No orders of the defined types were placed in this encounter.  All questions were answered. The patient knows to call the clinic with any problems, questions or concerns.  I have spent a total of 30 minutes minutes of face-to-face and non-face-to-face time, preparing to see the patient, performing a medically appropriate examination, counseling and educating the patient, documenting clinical information in the electronic health record,and care coordination.   Johnston Police PA-C Dept of Hematology and Oncology [facility] at [facility] Phone: [phone]

## 2023-05-31 ENCOUNTER — Ambulatory Visit (HOSPITAL_COMMUNITY)
Admission: RE | Admit: 2023-05-31 | Discharge: 2023-05-31 | Disposition: A | Discharge status: Home / Self Care | Payer: PPO | Source: Ambulatory Visit | Attending: Physician Assistant | Admitting: Physician Assistant

## 2023-05-31 DIAGNOSIS — N1330 Unspecified hydronephrosis: Secondary | ICD-10-CM | POA: Diagnosis not present

## 2023-05-31 DIAGNOSIS — R7989 Other specified abnormal findings of blood chemistry: Secondary | ICD-10-CM | POA: Insufficient documentation

## 2023-05-31 DIAGNOSIS — K760 Fatty (change of) liver, not elsewhere classified: Secondary | ICD-10-CM | POA: Insufficient documentation

## 2023-06-01 ENCOUNTER — Telehealth: Payer: Self-pay

## 2023-06-01 ENCOUNTER — Telehealth (HOSPITAL_COMMUNITY): Payer: Self-pay | Admitting: Cardiology

## 2023-06-01 NOTE — Telephone Encounter (Signed)
 Pt advised and voiced understanding.

## 2023-06-01 NOTE — Telephone Encounter (Signed)
-----   Message from Johnston [place] Police sent at 06/01/2023  1:45 PM EST ----- Can you notify patient that US  doesn't show any new changes compared to prior CT scan. ----- Message ----- From: Interface, Rad Results In Sent: 05/31/2023   3:07 PM EST To: Johnston [place] Police, PA-C

## 2023-06-01 NOTE — Telephone Encounter (Signed)
 06/01/2023 at 10:19- Called and spoke with patient and confirmed lab appt for 10:00 on 06/02/2023.

## 2023-06-02 ENCOUNTER — Encounter: Payer: Self-pay | Admitting: Hematology and Oncology

## 2023-06-02 ENCOUNTER — Ambulatory Visit (HOSPITAL_COMMUNITY)
Admission: RE | Admit: 2023-06-02 | Discharge: 2023-06-02 | Disposition: A | Discharge status: Home / Self Care | Payer: PPO | Source: Ambulatory Visit | Attending: Internal Medicine | Admitting: Internal Medicine

## 2023-06-02 DIAGNOSIS — I5032 Chronic diastolic (congestive) heart failure: Secondary | ICD-10-CM | POA: Diagnosis not present

## 2023-06-02 LAB — BASIC METABOLIC PANEL
Anion gap: 8 (ref 5–15)
BUN: 26 mg/dL — ABNORMAL HIGH (ref 8–23)
CO2: 23 mmol/L (ref 22–32)
Calcium: 8.7 mg/dL — ABNORMAL LOW (ref 8.9–10.3)
Chloride: 107 mmol/L (ref 98–111)
Creatinine, Ser: 2.32 mg/dL — ABNORMAL HIGH (ref 0.61–1.24)
GFR, Estimated: 30 mL/min — ABNORMAL LOW (ref >60)
Glucose, Bld: 135 mg/dL — ABNORMAL HIGH (ref 70–99)
Potassium: 4.8 mmol/L (ref 3.5–5.1)
Sodium: 138 mmol/L (ref 135–145)

## 2023-06-05 ENCOUNTER — Telehealth (HOSPITAL_COMMUNITY): Payer: Self-pay

## 2023-06-05 DIAGNOSIS — Z79899 Other long term (current) drug therapy: Secondary | ICD-10-CM

## 2023-06-05 DIAGNOSIS — I5032 Chronic diastolic (congestive) heart failure: Secondary | ICD-10-CM

## 2023-06-05 MED ORDER — FUROSEMIDE 20 MG PO TABS: 40.0000 mg | ORAL_TABLET | ORAL | 3 refills | Status: DC | PRN

## 2023-06-05 NOTE — Telephone Encounter (Signed)
-----   Message from Elmarie Hacking sent at 06/02/2023  2:17 PM EST ----- Renal function elevated. Please change Lasix  40 mg to PRN

## 2023-06-05 NOTE — Telephone Encounter (Signed)
 Patient's lasix  medication has been changed and updated in his chart. Pt aware, agreeable, and verbalized understanding.

## 2023-06-06 ENCOUNTER — Other Ambulatory Visit: Payer: PPO

## 2023-06-06 ENCOUNTER — Ambulatory Visit: Payer: PPO

## 2023-06-06 ENCOUNTER — Inpatient Hospital Stay: Payer: PPO | Admitting: Hematology and Oncology

## 2023-06-06 ENCOUNTER — Other Ambulatory Visit: Payer: Self-pay | Admitting: Hematology and Oncology

## 2023-06-06 ENCOUNTER — Inpatient Hospital Stay: Payer: PPO

## 2023-06-06 ENCOUNTER — Other Ambulatory Visit: Payer: Self-pay

## 2023-06-06 ENCOUNTER — Ambulatory Visit: Payer: PPO | Admitting: Physician Assistant

## 2023-06-06 VITALS — BP 137/75 | HR 58 | Temp 98.6°F | Resp 16 | Wt 260.3 lb

## 2023-06-06 DIAGNOSIS — C679 Malignant neoplasm of bladder, unspecified: Secondary | ICD-10-CM

## 2023-06-06 DIAGNOSIS — R7989 Other specified abnormal findings of blood chemistry: Secondary | ICD-10-CM | POA: Diagnosis not present

## 2023-06-06 DIAGNOSIS — Z95828 Presence of other vascular implants and grafts: Secondary | ICD-10-CM | POA: Diagnosis not present

## 2023-06-06 DIAGNOSIS — C671 Malignant neoplasm of dome of bladder: Secondary | ICD-10-CM | POA: Diagnosis not present

## 2023-06-06 LAB — CBC WITH DIFFERENTIAL (CANCER CENTER ONLY)
Abs Immature Granulocytes: 0.05 10*3/uL (ref 0.00–0.07)
Basophils Absolute: 0 10*3/uL (ref 0.0–0.1)
Basophils Relative: 0 %
Eosinophils Absolute: 0.2 10*3/uL (ref 0.0–0.5)
Eosinophils Relative: 2 %
HCT: 25 % — ABNORMAL LOW (ref 39.0–52.0)
Hemoglobin: 8.2 g/dL — ABNORMAL LOW (ref 13.0–17.0)
Immature Granulocytes: 1 %
Lymphocytes Relative: 13 %
Lymphs Abs: 1.3 10*3/uL (ref 0.7–4.0)
MCH: 28.5 pg (ref 26.0–34.0)
MCHC: 32.8 g/dL (ref 30.0–36.0)
MCV: 86.8 fL (ref 80.0–100.0)
Monocytes Absolute: 0.7 10*3/uL (ref 0.1–1.0)
Monocytes Relative: 7 %
Neutro Abs: 7.8 10*3/uL — ABNORMAL HIGH (ref 1.7–7.7)
Neutrophils Relative %: 77 %
Platelet Count: 227 10*3/uL (ref 150–400)
RBC: 2.88 MIL/uL — ABNORMAL LOW (ref 4.22–5.81)
RDW: 17.2 % — ABNORMAL HIGH (ref 11.5–15.5)
WBC Count: 10.2 10*3/uL (ref 4.0–10.5)
nRBC: 0 % (ref 0.0–0.2)

## 2023-06-06 LAB — CMP (CANCER CENTER ONLY)
ALT: 17 U/L (ref 0–44)
AST: 17 U/L (ref 15–41)
Albumin: 3.6 g/dL (ref 3.5–5.0)
Alkaline Phosphatase: 94 U/L (ref 38–126)
Anion gap: 8 (ref 5–15)
BUN: 27 mg/dL — ABNORMAL HIGH (ref 8–23)
CO2: 24 mmol/L (ref 22–32)
Calcium: 8.4 mg/dL — ABNORMAL LOW (ref 8.9–10.3)
Chloride: 103 mmol/L (ref 98–111)
Creatinine: 2.37 mg/dL — ABNORMAL HIGH (ref 0.61–1.24)
GFR, Estimated: 29 mL/min — ABNORMAL LOW (ref >60)
Glucose, Bld: 165 mg/dL — ABNORMAL HIGH (ref 70–99)
Potassium: 3.9 mmol/L (ref 3.5–5.1)
Sodium: 135 mmol/L (ref 135–145)
Total Bilirubin: 0.5 mg/dL (ref 0.0–1.2)
Total Protein: 7.7 g/dL (ref 6.5–8.1)

## 2023-06-06 LAB — MAGNESIUM: Magnesium: 2.1 mg/dL (ref 1.7–2.4)

## 2023-06-06 MED ORDER — SODIUM CHLORIDE 0.9% FLUSH
10.0000 mL | Freq: Once | INTRAVENOUS | Status: AC
  Administered 2023-06-06: 10 mL

## 2023-06-06 MED ORDER — HEPARIN SOD (PORK) LOCK FLUSH 100 UNIT/ML IV SOLN
500.0000 [IU] | Freq: Once | INTRAVENOUS | Status: AC
  Administered 2023-06-06: 500 [IU]

## 2023-06-06 MED ORDER — SODIUM CHLORIDE 0.9% FLUSH
10.0000 mL | Freq: Once | INTRAVENOUS | Status: AC
Start: 2023-06-06 — End: 2023-06-06
  Administered 2023-06-06: 10 mL

## 2023-06-06 NOTE — Progress Notes (Signed)
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 [name]  08/12/2022: Cycle 1 of Pembrolizumab 09/02/2022: Cycle 2 of Pembrolizumab  09/23/2022: Cycle 3 of Pembrolizumab 10/14/2022: Cycle 4 of Pembrolizumab 11/04/2022: Cycle 5 of Pembrolizumab 11/25/2022: Cycle 6 of Pembrolizumab 12/20/2022: Cycle 7 of Pembrolizumab 01/13/2023: Cycle 8 of Pembrolizumab  03/03/2023: Cystectomy/Prostatectomy showed infiltrative high-grade urothelial carcinoma, size 5.8 cm involving bladder dome, anterior wall and prostatic stroma. Tumor invades directly into prostatic stroma at apex and mid portion of the gland (pT4a). Metastatic urothelial cancer in one of two left common iliac lymph nodes.  05/30/2023: Cycle 1 Day 1 of Gem/Cis deferred due to rising creatinine levels and anemia.   CHIEF COMPLAINTS/PURPOSE OF CONSULTATION:  "High Risk Non-Muscle Invasive Bladder Cancer "  HISTORY OF PRESENTING ILLNESS:  Luis [name] 68 y.o. 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 He presents today with  his wife to start cycle 1, day 1 of gemcitabine/cisplatin.   On exam today Luis [name] reports he is disheartened by the fact that his creatinine has not improved.  He reports he has been feeling fatigued and unfortunately not sleeping well.  He did wake up at 5 AM this morning.  He reports that his Lasix is now as needed.  He underwent the ultrasound of his kidneys which showed no evidence of hydronephrosis.  He reports that his urine tends to be clear with no dark color or bright yellow.  He reports he is not currently in pain.  Does have some occasional runny nose and watery eyes.  He notes his appetite has been suppressed and he feels like he does not eat full meals and only snacks.  He reports overall he "does not feel good".. He denies fevers, chills, sweats, shortness of breath, chest pain, cough, headaches, dizziness or peripheral edema.He has no other complaints. A full 10 point ROS is otherwise negative.  MEDICAL HISTORY:  Past Medical History:  Diagnosis Date   Anxiety    takes Valium as needed   Aortic stenosis, moderate    Arthritis    Ascending aortic aneurysm (HCC)    CAD (coronary artery disease)    a. s/p PCI of the RCA 8/12 with DES by Dr Excell Seltzer, preserved EF. b. LHC/RHC (2/16) with mean RA 12, PA 32/15, mean PCWP 18, CI 3.47; patent mid and distal RCA stents, 50-60% proximal stenosis small PDA.      Cancer [facility])    bladder   Carotid stenosis    a. Carotid US (05/2013):  Bilateral 1-39% ICA; L thyroid nodule (prior hx of aspiration).   Chronic diastolic CHF (congestive heart failure) (HCC)    Complication of anesthesia    difficulty waking up after gallbladder  surgery   Depression    Dyspnea    Essential hypertension    GERD (gastroesophageal reflux disease)    if needed will take OTC meds    Heart murmur    History of colonic polyps    hyperplastic   Hyperlipidemia    Joint pain    Lesion of bladder    Myocardial infarction (HCC) 2012   Obesity (BMI 30-39.9)  02/29/2016   Pre-diabetes    Restless leg    Sleep apnea    uses cpap   Tubular adenoma of colon    Vertigo    takes Meclizine as needed    SURGICAL HISTORY: Past Surgical History:  Procedure Laterality Date   AORTIC VALVE REPLACEMENT N/A 09/16/2021   Procedure: AORTIC VALVE REPLACEMENT (AVR);  Surgeon: Alleen Borne, MD;  Location: [facility] OR;  Service: Open Heart Surgery;  Laterality: N/A;   CATARACT EXTRACTION   4 YRS AGO   BOTH EYES   CHOLECYSTECTOMY  07/21/2011   Procedure: LAPAROSCOPIC CHOLECYSTECTOMY WITH INTRAOPERATIVE CHOLANGIOGRAM;  Surgeon: Kandis Cocking, MD;  Location: [facility] ORS;  Service: General;  Laterality: N/A;   CORONARY ANGIOPLASTY  2012   2 stents   coronary stenting     s/p PCI of the RCA by Dr Excell Seltzer 8/12 with 2 promus stents   CYSTOSCOPY W/ RETROGRADES Bilateral 12/13/2019   Procedure: CYSTOSCOPY WITH RETROGRADE PYELOGRAM;  Surgeon: Sebastian Ache, MD;  Location: [facility];  Service: Urology;  Laterality: Bilateral;   CYSTOSCOPY W/ RETROGRADES Bilateral 10/14/2020   Procedure: CYSTOSCOPY WITH RETROGRADE PYELOGRAM;  Surgeon: Sebastian Ache, MD;  Location: [facility];  Service: Urology;  Laterality: Bilateral;   CYSTOSCOPY W/ RETROGRADES Bilateral 12/16/2020   Procedure: CYSTOSCOPY WITH RETROGRADE PYELOGRAM;  Surgeon: Sebastian Ache, MD;  Location: [facility];  Service: Urology;  Laterality: Bilateral;   CYSTOSCOPY W/ RETROGRADES Bilateral 06/03/2022   Procedure: CYSTOSCOPY WITH RETROGRADE PYELOGRAM;  Surgeon: Sebastian Ache, MD;  Location: [facility];  Service: Urology;  Laterality: Bilateral;   CYSTOSCOPY W/ RETROGRADES Bilateral 12/28/2022   Procedure: CYSTOSCOPY WITH RETROGRADE PYELOGRAM, FULGARATION OF BLEEDERS;  Surgeon: Loletta Parish., MD;  Location: [facility];  Service: Urology;  Laterality: Bilateral;   CYSTOSCOPY WITH INJECTION N/A 03/03/2023   Procedure: CYSTOSCOPY WITH INDOCYANINE INJECTION;  Surgeon: Loletta Parish., MD;  Location: [facility];  Service: Urology;  Laterality: N/A;  360 MINUTES   IR IMAGING GUIDED PORT INSERTION  05/29/2023   LEFT AND RIGHT HEART CATHETERIZATION WITH CORONARY ANGIOGRAM N/A 06/23/2014   Procedure: LEFT AND RIGHT HEART CATHETERIZATION WITH CORONARY ANGIOGRAM;  Surgeon: Laurey Morale, MD;  Location: [facility] CATH LAB;  Service: Cardiovascular;  Laterality: N/A;   NECK SURGERY  03/23/09   per Dr. Ophelia Charter, cervical fusion    PERICARDIOCENTESIS N/A 09/28/2021   Procedure: PERICARDIOCENTESIS;  Surgeon: Corky Crafts, MD;  Location: [facility] INVASIVE CV LAB;  Service: Cardiovascular;  Laterality: N/A;   REPLACEMENT ASCENDING AORTA N/A 09/16/2021   Procedure: REPLACEMENT ASCENDING AORTA WITH 30 X HEMASHIELD PLATINUM WOVEN DOUBLE VELOUR VASCULAR GRAFT;  Surgeon: Alleen Borne, MD;  Location: [facility] OR;  Service: Open Heart Surgery;  Laterality: N/A;  CIRC ARREST   right elbow surgery     RIGHT HEART CATH AND CORONARY ANGIOGRAPHY N/A 07/08/2021   Procedure: RIGHT HEART CATH AND CORONARY ANGIOGRAPHY;  Surgeon: Laurey Morale, MD;  Location: [facility] INVASIVE CV LAB;  Service: Cardiovascular;  Laterality: N/A;   ROBOT ASSISTED LAPAROSCOPIC  COMPLETE CYSTECT ILEAL CONDUIT N/A 03/03/2023   Procedure: XI ROBOTIC ASSISTED LAPAROSCOPIC COMPLETE CYSTECTECTOMY WITH  ILEAL CONDUIT DIVERSION;  Surgeon: Loletta Parish., MD;  Location: [facility];  Service: Urology;  Laterality: N/A;   ROBOT ASSISTED LAPAROSCOPIC RADICAL PROSTATECTOMY N/A 03/03/2023   Procedure: XI ROBOTIC ASSISTED LAPAROSCOPIC RADICAL PROSTATECTOMY WITH LYMPH NODE DISSECTION;  Surgeon: Loletta Parish., MD;  Location: [facility];  Service: Urology;  Laterality: N/A;   solonscopy  05/23/08   per Dr. Celene Kras hemorrhoids only, repeat in 5 years   SUBXYPHOID PERICARDIAL WINDOW N/A 09/28/2021   Procedure: SUBXYPHOID PERICARDIAL WINDOW;  Surgeon: Alleen Borne, MD;  Location: [facility] OR;  Service: Thoracic;  Laterality: N/A;   TEE  WITHOUT CARDIOVERSION N/A 06/23/2014   Procedure: TRANSESOPHAGEAL ECHOCARDIOGRAM (TEE);  Surgeon: Laurey Morale, MD;  Location: [facility] ENDOSCOPY;  Service: Cardiovascular;  Laterality: N/A;   TEE WITHOUT CARDIOVERSION N/A 01/21/2016   Procedure: TRANSESOPHAGEAL ECHOCARDIOGRAM (TEE);  Surgeon: Laurey Morale, MD;  Location: [facility] ENDOSCOPY;  Service: Cardiovascular;  Laterality: N/A;   TEE WITHOUT CARDIOVERSION N/A 09/16/2021   Procedure: TRANSESOPHAGEAL ECHOCARDIOGRAM (TEE);  Surgeon: Alleen Borne, MD;  Location: [facility] OR;  Service: Open Heart Surgery;  Laterality: N/A;   TEE WITHOUT CARDIOVERSION N/A 09/28/2021   Procedure: TRANSESOPHAGEAL ECHOCARDIOGRAM (TEE);  Surgeon: Alleen Borne, MD;  Location: [facility] OR;  Service: Thoracic;  Laterality: N/A;   TONSILLECTOMY     TRANSESOPHAGEAL ECHOCARDIOGRAM (CATH LAB) N/A 04/17/2023   Procedure: TRANSESOPHAGEAL ECHOCARDIOGRAM;  Surgeon: Jake Bathe, MD;  Location: [facility] INVASIVE CV LAB;  Service: Cardiovascular;  Laterality: N/A;   TRANSURETHRAL RESECTION OF BLADDER TUMOR N/A 10/21/2019   Procedure: TRANSURETHRAL RESECTION OF BLADDER TUMOR (TURBT);  Surgeon: Ihor Gully, MD;  Location: [facility];  Service: Urology;  Laterality: N/A;   TRANSURETHRAL RESECTION OF BLADDER TUMOR N/A 12/13/2019   Procedure: TRANSURETHRAL RESECTION OF BLADDER TUMOR (TURBT);  Surgeon: Sebastian Ache, MD;  Location: [facility];  Service: Urology;  Laterality: N/A;  1 HR   TRANSURETHRAL RESECTION OF BLADDER TUMOR N/A 10/14/2020   Procedure: TRANSURETHRAL RESECTION OF BLADDER TUMOR (TURBT);  Surgeon: Sebastian Ache, MD;  Location: [facility];  Service: Urology;  Laterality: N/A;   TRANSURETHRAL RESECTION OF BLADDER TUMOR N/A 12/16/2020   Procedure: RESTAGING TRANSURETHRAL RESECTION OF BLADDER TUMOR (TURBT);  Surgeon: Sebastian Ache, MD;  Location: [facility];  Service: Urology;  Laterality: N/A;   TRANSURETHRAL RESECTION OF  BLADDER TUMOR N/A 06/03/2022   Procedure: TRANSURETHRAL RESECTION OF BLADDER TUMOR (TURBT);  Surgeon: Sebastian Ache, MD;  Location[facility];  Service: Urology;  Laterality: N/A;   UMBILICAL HERNIA REPAIR  03/03/2023   Procedure: HERNIA REPAIR UMBILICAL;  Surgeon: Loletta Parish., MD;  Location: [facility];  Service: Urology;;    SOCIAL HISTORY: Social History   Socioeconomic History   Marital status: Married    Spouse name: Not on file   Number of children: 4   Years of education: Not on file   Highest education level: Not on file  Occupational History   Occupation: Manufacturing engineer    Employer: [employer]  Tobacco Use   Smoking status: Former    Current packs/day: 0.00    Average packs/day: 2.0 packs/day for 40.0 years (80.0 ttl pk-yrs)    Types: Cigarettes    Start date: 23    Quit date: 2012    Years since quitting: 13.1    Passive exposure: Never  Smokeless tobacco: Never  Vaping Use   Vaping status: Never Used  Substance and Sexual Activity   Alcohol use: No    Alcohol/week: 0.0 standard drinks of alcohol   Drug use: No   Sexual activity: Not on file  Other Topics Concern   Not on file  Social History Narrative   Not on file   Social Drivers of Health   Financial Resource Strain: Low Risk  (11/03/2022)   Overall Financial Resource Strain (CARDIA)    Difficulty of Paying Living Expenses: Not hard at all  Food Insecurity: No Food Insecurity (05/09/2023)   Hunger Vital Sign    Worried About Running Out of Food in the Last Year: Never true    Ran Out of Food in the Last Year: Never true  Transportation Needs: No Transportation Needs (05/09/2023)   PRAPARE - Administrator, Civil Service (Medical): No    Lack of Transportation (Non-Medical): No  Physical Activity: Unknown (11/03/2022)   Exercise Vital Sign    Days of Exercise per Week: Patient unable to answer    Minutes of Exercise per Session: 30 min  Stress: No Stress Concern  Present (11/03/2022)   [employer] of Occupational Health - Occupational Stress Questionnaire    Feeling of Stress : Not at all  Social Connections: Moderately Isolated (11/03/2022)   Social Connection and Isolation Panel [NHANES]    Frequency of Communication with Friends and Family: More than three times a week    Frequency of Social Gatherings with Friends and Family: More than three times a week    Attends Religious Services: Never    Database administrator or Organizations: No    Attends Banker Meetings: Never    Marital Status: Married  Catering manager Violence: Not At Risk (05/09/2023)   Humiliation, Afraid, Rape, and Kick questionnaire    Fear of Current or Ex-Partner: No    Emotionally Abused: No    Physically Abused: No    Sexually Abused: No    FAMILY HISTORY: Family History  Problem Relation Age of Onset   Lung cancer Mother        lung   Esophageal cancer Cousin    Colon cancer Neg Hx    Rectal cancer Neg Hx    Stomach cancer Neg Hx     ALLERGIES:  has no known allergies.  MEDICATIONS:  Current Outpatient Medications  Medication Sig Dispense Refill   acetaminophen (TYLENOL) 325 MG tablet Take 650 mg by mouth every 6 (six) hours as needed (for pain).     amLODipine (NORVASC) 5 MG tablet Take 1 tablet (5 mg total) by mouth daily. 90 tablet 1   aspirin EC 81 MG tablet Take 81 mg by mouth in the morning.     carvedilol (COREG) 3.125 MG tablet TAKE 1 TABLET BY MOUTH TWICE A DAY WITH FOOD 180 tablet 1   diazepam (VALIUM) 5 MG tablet TAKE 1 TABLET BY MOUTH EVERY 12 HOURS AS NEEDED FOR ANXIETY. (Patient taking differently: Take 5 mg by mouth every 12 (twelve) hours as needed for anxiety.) 60 tablet 5   ferrous sulfate 325 (65 FE) MG tablet Take 325 mg by mouth daily with breakfast.     furosemide (LASIX) 20 MG tablet Take 2 tablets (40 mg total) by mouth as needed. 45 tablet 3   ipratropium (ATROVENT) 0.03 % nasal spray Place 2 sprays into both  nostrils every 12 (twelve) hours as needed for rhinitis.  JARDIANCE 10 MG TABS tablet Take 1 tablet (10 mg total) by mouth daily. 30 tablet 11   lidocaine-prilocaine (EMLA) cream Apply 1 Application topically as needed. 30 g 0   Magnesium 500 MG TABS Take 500 mg by mouth in the morning.     meclizine (ANTIVERT) 25 MG tablet Take 1 tablet (25 mg total) by mouth 3 (three) times daily as needed for dizziness. 30 tablet 0   multivitamin-iron-minerals-folic acid (CENTRUM) chewable tablet Chew 1 tablet by mouth daily.     nitroGLYCERIN (NITROSTAT) 0.4 MG SL tablet Place 0.4 mg under the tongue every 5 (five) minutes as needed for chest pain.     ondansetron (ZOFRAN) 8 MG tablet Take 1 tablet (8 mg total) by mouth every 8 (eight) hours as needed. 30 tablet 0   potassium chloride SA (KLOR-CON M20) 20 MEQ tablet Take 1 tablet (20 mEq total) by mouth daily.     prochlorperazine (COMPAZINE) 10 MG tablet Take 1 tablet (10 mg total) by mouth every 6 (six) hours as needed for nausea or vomiting. 30 tablet 0   rosuvastatin (CRESTOR) 20 MG tablet TAKE 1 TABLET BY MOUTH EVERYDAY AT BEDTIME (Patient taking differently: Take 20 mg by mouth at bedtime.) 90 tablet 3   venlafaxine XR (EFFEXOR-XR) 150 MG 24 hr capsule TAKE 1 CAPSULE BY MOUTH DAILY WITH BREAKFAST. 90 capsule 0   No current facility-administered medications for this visit.    REVIEW OF SYSTEMS:   Constitutional: ( - ) fevers, ( - )  chills , ( - ) night sweats Eyes: ( - ) blurriness of vision, ( - ) double vision, ( - ) watery eyes Ears, nose, mouth, throat, and face: ( - ) mucositis, ( - ) sore throat Respiratory: ( - ) cough, ( - ) dyspnea, ( - ) wheezes Cardiovascular: ( - ) palpitation, ( - ) chest discomfort, ( - ) lower extremity swelling Gastrointestinal:  ( - ) nausea, ( - ) heartburn, ( - ) change in bowel habits Skin: ( - ) abnormal skin rashes Lymphatics: ( - ) new lymphadenopathy, ( - ) easy bruising Neurological: ( - ) numbness, ( -  ) tingling, ( - ) new weaknesses Behavioral/Psych: ( - ) mood change, ( - ) new changes  All other systems were reviewed with the patient and are negative.  PHYSICAL EXAMINATION: ECOG PERFORMANCE STATUS: 1 - Symptomatic but completely ambulatory  Vitals:   06/06/23 0850  BP: 137/75  Pulse: (!) 58  Resp: 16  Temp: 98.6 F (37 C)  SpO2: 98%    Filed Weights   06/06/23 0850  Weight: 260 lb 4.8 oz (118.1 kg)     GENERAL: well appearing elderly Caucasian male in NAD  SKIN: skin color, texture, turgor are normal, no rashes or significant lesions EYES: conjunctiva are pink and non-injected, sclera clear LUNGS: clear to auscultation and percussion with normal breathing effort HEART: regular rate & rhythm and no murmurs and no lower extremity edema Musculoskeletal: no cyanosis of digits and no clubbing  PSYCH: alert & oriented x 3, fluent speech NEURO: no focal motor/sensory deficits  LABORATORY DATA:  I have reviewed the data as listed    Latest Ref Rng & Units 06/06/2023    8:10 AM 05/30/2023    7:26 AM 05/23/2023    1:27 PM  CBC  WBC 4.0 - 10.5 K/uL 10.2  7.8  6.9   Hemoglobin 13.0 - 17.0 g/dL 8.2  8.5  9.3   Hematocrit 39.0 -  52.0 % 25.0  26.6  28.5   Platelets 150 - 400 K/uL 227  211  196        Latest Ref Rng & Units 06/06/2023    8:10 AM 06/02/2023   10:02 AM 05/30/2023    7:26 AM  CMP  Glucose 70 - 99 mg/dL 161  096  045   BUN 8 - 23 mg/dL 27  26  21    Creatinine 0.61 - 1.24 mg/dL 4.09  8.11  9.14   Sodium 135 - 145 mmol/L 135  138  137   Potassium 3.5 - 5.1 mmol/L 3.9  4.8  4.1   Chloride 98 - 111 mmol/L 103  107  106   CO2 22 - 32 mmol/L 24  23  23    Calcium 8.9 - 10.3 mg/dL 8.4  8.7  8.2   Total Protein 6.5 - 8.1 g/dL 7.7   7.5   Total Bilirubin 0.0 - 1.2 mg/dL 0.5   0.4   Alkaline Phos 38 - 126 U/L 94   88   AST 15 - 41 U/L 17   14   ALT 0 - 44 U/L 17   14     RADIOGRAPHIC STUDIES: US RENAL Result Date: 05/31/2023 CLINICAL DATA:  Renal insufficiency with  elevated creatinine levels. Previous cystectomy and ileal conduit. Previous right ureteral stent. EXAM: RENAL / URINARY TRACT ULTRASOUND COMPLETE COMPARISON:  Right upper quadrant abdominal ultrasound dated 06/04/2011. Abdomen and pelvis CT dated 04/13/2023. FINDINGS: Right Kidney: Renal measurements: 12.7 x 5.7 x 5.5 cm = volume: 206 mL. Normal echotexture. Mild-to-moderate dilatation of the renal collecting system, similar to the previous CT. Left Kidney: Renal measurements: 13.0 x 5.7 x 5.7 cm = volume: 194 mL. Normal echotexture. Mild-to-moderate dilatation of the renal collecting system, similar to the previous CT. Exophytic simple cysts, unchanged. These do not need imaging follow-up. Bladder: Surgically absent. Other: Diffusely echogenic liver. Corresponding mild low density on the previous CT. IMPRESSION: 1. Mild-to-moderate bilateral hydronephrosis, similar to the previous CT. 2. Diffuse hepatic steatosis. Electronically Signed   By: Beckie Salts M.D.   On: 05/31/2023 15:05   IR IMAGING GUIDED PORT INSERTION Result Date: 05/29/2023 CLINICAL DATA:  Bladder carcinoma, needs durable venous access for planned treatment regimen. EXAM: TUNNELED PORT CATHETER PLACEMENT WITH ULTRASOUND AND FLUOROSCOPIC GUIDANCE FLUOROSCOPY: Radiation Exposure Index (as provided by the fluoroscopic device): 27 mGy air Kerma ANESTHESIA/SEDATION: Intravenous Fentanyl and Versed 3mg  were administered by RN during a total moderate (conscious) sedation time of 19 minutes; the patient's level of consciousness and physiological / cardiorespiratory status were monitored continuously by radiology RN under my direct supervision. TECHNIQUE: The procedure, risks, benefits, and alternatives were explained to the patient. Questions regarding the procedure were encouraged and answered. The patient understands and consents to the procedure. Patency of the right IJ vein was confirmed with ultrasound with image documentation. An appropriate  skin site was determined. Skin site was marked. Region was prepped using maximum barrier technique including cap and mask, sterile gown, sterile gloves, large sterile sheet, and Chlorhexidine as cutaneous antisepsis. The region was infiltrated locally with 1% lidocaine. Under real-time ultrasound guidance, the right IJ vein was accessed with a 21 gauge micropuncture needle; the needle tip within the vein was confirmed with ultrasound image documentation. Needle was exchanged over a 018 guidewire for transitional dilator, and vascular measurement was performed. A small incision was made on the right anterior chest wall and a subcutaneous pocket fashioned. The power-injectable port was positioned  and its catheter tunneled to the right IJ dermatotomy site. The transitional dilator was exchanged over an Amplatz wire for a peel-away sheath, through which the port catheter, which had been trimmed to the appropriate length, was advanced and positioned under fluoroscopy with its tip at the cavoatrial junction. Spot chest radiograph confirms good catheter position and no pneumothorax. The port was flushed per protocol. The pocket was closed with deep interrupted and subcuticular continuous 3-0 Monocryl sutures. The incisions were covered with Dermabond then covered with a sterile dressing. The patient tolerated the procedure well. COMPLICATIONS: COMPLICATIONS None immediate IMPRESSION: Technically successful right IJ power-injectable port catheter placement. Ready for routine use. Electronically Signed   By: Corlis Leak M.D.   On: 05/29/2023 11:39   CT Chest W Contrast Result Date: 05/18/2023 CLINICAL DATA:  Bladder cancer, staging.  * Tracking Code: BO * EXAM: CT CHEST WITH CONTRAST TECHNIQUE: Multidetector CT imaging of the chest was performed during intravenous contrast administration. RADIATION DOSE REDUCTION: This exam was performed according to the departmental dose-optimization program which includes automated  exposure control, adjustment of the mA and/or kV according to patient size and/or use of iterative reconstruction technique. CONTRAST:  75mL OMNIPAQUE IOHEXOL 300 MG/ML  SOLN COMPARISON:  Chest CT 04/18/2023 and 12/14/2022. FINDINGS: Cardiovascular: Stable postsurgical changes status post median sternotomy, aortic valve replacement and aortic root grafting. There is atherosclerosis of the aorta, great vessels and coronary arteries. No acute vascular findings are identified. The heart size is normal. There is no pericardial effusion. Mediastinum/Nodes: There are no enlarged mediastinal, hilar or axillary lymph nodes. The thyroid gland, trachea and esophagus demonstrate no significant findings. Lungs/Pleura: No pleural effusion or pneumothorax. The lungs are clear. No confluent airspace disease or suspicious pulmonary nodularity. Upper abdomen: Hepatic steatosis and prior cholecystectomy. No acute or suspicious findings identified in the visualized upper abdomen. Musculoskeletal/Chest wall: There is no chest wall mass or suspicious osseous finding. Mild symmetric bilateral gynecomastia. Previous lower cervical fusion and median sternotomy. Multilevel spondylosis. IMPRESSION: 1. No evidence of metastatic disease in the chest. 2. Stable postsurgical changes status post median sternotomy, aortic valve replacement and aortic root grafting. 3. Hepatic steatosis. 4. Coronary and aortic Atherosclerosis (ICD10-I70.0). Electronically Signed   By: Carey Bullocks M.D.   On: 05/18/2023 12:00    ASSESSMENT & PLAN Luis [name] 68 y.o. 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  After review of the labs, review of the records, and discussion with the patient the patients findings are most consistent with BCG unresponsive high risk non-muscle invasive bladder cancer, not a candidate for cystectomy.  # BCG-Unresponsive High Risk  Non-Muscle Invasive Bladder Cancer --patient is not felt to be a candidate for cystectomy -- Recommended pembrolizumab 200 mg q. 21 days until progression or intolerance up to 24 months. --Received 8 cycles of Pembrolizumab on 08/12/2022-01/13/2023: Cycle 8 of Pembrolizumab  --On 03/03/2023, underwent Cystectomy/Prostatectomy showed infiltrative high-grade urothelial carcinoma, size 5.8 cm involving bladder dome, anterior wall and prostatic stroma. Tumor invades directly into prostatic stroma at apex and mid portion of the gland (pT4a). Metastatic urothelial cancer in one of two left common iliac lymph nodes.  -- Given the results of the pathology showing positive margins and spread to the lymph node we are considering radiation therapy versus chemotherapy.  For chemotherapy, recommend gemcitabine and cisplatin x 4 cycles.  This would consist of gemcitabine 1000 mg/m on days 1 and 8 and cisplatin 70  mg/m on day 1 of 21-day cycle for 4 cycles. PLAN:  --Due for Cycle 1, Day 1 of Gemcitabine plus Cisplatin, will HOLD due to Cr 2.37 --Labs from today were reviewed with patient. WBC 10.2, hemoglobin 8.2, MCV 86.8, platelets 227, LFTs in range.  --Due to rising creatinine levels and anemia, recommended to defer start of treatment last week.  --RTC in 2 weeks with labs and follow up before Cycle 1, Day 15 of Gem/Cis  #AKI -- Creatinine levels 2.37 today (it was 1.36 on 04/20/2023).  -- Discussed lasix dosage with cardiology NP who recommended to decrease lasix to 40 mg PO daily PRN since he is not complaining of SOB or edema. Recommendations from patient's nephrologist are pending.  -- received IV fluids today 150 ml/hr over 3 hours at last visit.  --  US renal did not show any obstructive etiologies.   # Abdominal Rash--improved -- Small circular rash, approximately 3 inches in diameter. -- Patient has access to hydrocortisone 5% at home, recommend he apply that to the lesion 1-2 times daily to see if  there is improvement.  #Supportive Care -- chemotherapy education complete -- port placed -- zofran 8mg  q8H PRN and compazine 10mg  PO q6H for nausea -- EMLA cream for port -- no pain medication required at this time.    No orders of the defined types were placed in this encounter.  All questions were answered. The patient knows to call the clinic with any problems, questions or concerns.  I have spent a total of 30 minutes minutes of face-to-face and non-face-to-face time, preparing to see the patient, performing a medically appropriate examination, counseling and educating the patient, documenting clinical information in the electronic health record,and care coordination.   Ulysees Barns, MD Department of Hematology/Oncology [facility] at [facility] Phone: [phone] Pager: [phone] Email: [email or web address]

## 2023-06-06 NOTE — Progress Notes (Unsigned)
Subjective:  Chief complaint: malaise, poor appetite, sleeping excessively , chills   Patient ID: Luis [name], male    DOB: 21-Feb-1956, 67 y.o.   MRN: [id number]  HPI  Discussed the use of AI scribe software for clinical note transcription with the patient, who gave verbal consent to proceed.  History of Present Illness   The patient, with a history of bladder cancer, artificial heart valve, aortic graft, and kidney disease, presents with decreased appetite and general malaise. He reports feeling unwell, with symptoms including cold chills and increased sleepiness. These symptoms are similar to those experienced prior to his previous bloodstream infection. He also reports a recent increase in blood pressure and has been prescribed an additional blood thinner by his oncologist. He had E faecalis bacteremia but no endocarditis on TEE and no infection of graft on CT chest.       Past Medical History:  Diagnosis Date   Anxiety    takes Valium as needed   Aortic stenosis, moderate    Arthritis    Ascending aortic aneurysm (HCC)    CAD (coronary artery disease)    a. s/p PCI of the RCA 8/12 with DES by Dr Excell Seltzer, preserved EF. b. LHC/RHC (2/16) with mean RA 12, PA 32/15, mean PCWP 18, CI 3.47; patent mid and distal RCA stents, 50-60% proximal stenosis small PDA.      Cancer [facility])    bladder   Carotid stenosis    a. Carotid US (05/2013):  Bilateral 1-39% ICA; L thyroid nodule (prior hx of aspiration).   Chronic diastolic CHF (congestive heart failure) (HCC)    Complication of anesthesia    difficulty waking up after gallbladder surgery   Depression    Dyspnea    Essential hypertension    GERD (gastroesophageal reflux disease)    if needed will take OTC meds    Heart murmur    History of colonic polyps    hyperplastic   Hyperlipidemia    Joint pain    Lesion of bladder    Myocardial infarction (HCC) 2012   Obesity (BMI 30-39.9) 02/29/2016   Pre-diabetes    Restless leg     Sleep apnea    uses cpap   Tubular adenoma of colon    Vertigo    takes Meclizine as needed    Past Surgical History:  Procedure Laterality Date   AORTIC VALVE REPLACEMENT N/A 09/16/2021   Procedure: AORTIC VALVE REPLACEMENT (AVR);  Surgeon: Alleen Borne, MD;  Location[facility] OR;  Service: Open Heart Surgery;  Laterality: N/A;   CATARACT EXTRACTION   4 YRS AGO   BOTH EYES   CHOLECYSTECTOMY  07/21/2011   Procedure: LAPAROSCOPIC CHOLECYSTECTOMY WITH INTRAOPERATIVE CHOLANGIOGRAM;  Surgeon: Kandis Cocking, MD;  Location: [facility] ORS;  Service: General;  Laterality: N/A;   CORONARY ANGIOPLASTY  2012   2 stents   coronary stenting     s/p PCI of the RCA by Dr Excell Seltzer 8/12 with 2 promus stents   CYSTOSCOPY W/ RETROGRADES Bilateral 12/13/2019   Procedure: CYSTOSCOPY WITH RETROGRADE PYELOGRAM;  Surgeon: Sebastian Ache, MD;  Location: [facility];  Service: Urology;  Laterality: Bilateral;   CYSTOSCOPY W/ RETROGRADES Bilateral 10/14/2020   Procedure: CYSTOSCOPY WITH RETROGRADE PYELOGRAM;  Surgeon: Sebastian Ache, MD;  Location[facility];  Service: Urology;  Laterality: Bilateral;   CYSTOSCOPY W/ RETROGRADES Bilateral 12/16/2020   Procedure: CYSTOSCOPY WITH RETROGRADE PYELOGRAM;  Surgeon: Sebastian Ache, MD;  Location: [facility];  Service: Urology;  Laterality: Bilateral;   CYSTOSCOPY W/ RETROGRADES Bilateral 06/03/2022   Procedure: CYSTOSCOPY WITH RETROGRADE PYELOGRAM;  Surgeon: Sebastian Ache, MD;  Location: [facility];  Service: Urology;  Laterality: Bilateral;   CYSTOSCOPY W/ RETROGRADES Bilateral 12/28/2022   Procedure: CYSTOSCOPY WITH RETROGRADE PYELOGRAM, FULGARATION OF BLEEDERS;  Surgeon: Loletta Parish., MD;  Location: [facility];  Service: Urology;  Laterality: Bilateral;   CYSTOSCOPY WITH INJECTION N/A 03/03/2023   Procedure: CYSTOSCOPY WITH INDOCYANINE INJECTION;  Surgeon: Loletta Parish., MD;  Location: [facility];  Service: Urology;  Laterality:  N/A;  360 MINUTES   IR IMAGING GUIDED PORT INSERTION  05/29/2023   LEFT AND RIGHT HEART CATHETERIZATION WITH CORONARY ANGIOGRAM N/A 06/23/2014   Procedure: LEFT AND RIGHT HEART CATHETERIZATION WITH CORONARY ANGIOGRAM;  Surgeon: Laurey Morale, MD;  Location: [facility] CATH LAB;  Service: Cardiovascular;  Laterality: N/A;   NECK SURGERY  03/23/09   per Dr. Ophelia Charter, cervical fusion    PERICARDIOCENTESIS N/A 09/28/2021   Procedure: PERICARDIOCENTESIS;  Surgeon: Corky Crafts, MD;  Location: [facility] INVASIVE CV LAB;  Service: Cardiovascular;  Laterality: N/A;   REPLACEMENT ASCENDING AORTA N/A 09/16/2021   Procedure: REPLACEMENT ASCENDING AORTA WITH 30 X HEMASHIELD PLATINUM WOVEN DOUBLE VELOUR VASCULAR GRAFT;  Surgeon: Alleen Borne, MD;  Location: [facility] OR;  Service: Open Heart Surgery;  Laterality: N/A;  CIRC ARREST   right elbow surgery     RIGHT HEART CATH AND CORONARY ANGIOGRAPHY N/A 07/08/2021   Procedure: RIGHT HEART CATH AND CORONARY ANGIOGRAPHY;  Surgeon: Laurey Morale, MD;  Location: [facility] INVASIVE CV LAB;  Service: Cardiovascular;  Laterality: N/A;   ROBOT ASSISTED LAPAROSCOPIC COMPLETE CYSTECT ILEAL CONDUIT N/A 03/03/2023   Procedure: XI ROBOTIC ASSISTED LAPAROSCOPIC COMPLETE CYSTECTECTOMY WITH  ILEAL CONDUIT DIVERSION;  Surgeon: Loletta Parish., MD;  Location: [facility];  Service: Urology;  Laterality: N/A;   ROBOT ASSISTED LAPAROSCOPIC RADICAL PROSTATECTOMY N/A 03/03/2023   Procedure: XI ROBOTIC ASSISTED LAPAROSCOPIC RADICAL PROSTATECTOMY WITH LYMPH NODE DISSECTION;  Surgeon: Loletta Parish., MD;  Location: [facility];  Service: Urology;  Laterality: N/A;   solonscopy  05/23/08   per Dr. Celene Kras hemorrhoids only, repeat in 5 years   SUBXYPHOID PERICARDIAL WINDOW N/A 09/28/2021   Procedure: SUBXYPHOID PERICARDIAL WINDOW;  Surgeon: Alleen Borne, MD;  Location: [facility] OR;  Service: Thoracic;  Laterality: N/A;   TEE WITHOUT CARDIOVERSION N/A 06/23/2014   Procedure: TRANSESOPHAGEAL  ECHOCARDIOGRAM (TEE);  Surgeon: Laurey Morale, MD;  Location: [facility] ENDOSCOPY;  Service: Cardiovascular;  Laterality: N/A;   TEE WITHOUT CARDIOVERSION N/A 01/21/2016   Procedure: TRANSESOPHAGEAL ECHOCARDIOGRAM (TEE);  Surgeon: Laurey Morale, MD;  Location: [facility] ENDOSCOPY;  Service: Cardiovascular;  Laterality: N/A;   TEE WITHOUT CARDIOVERSION N/A 09/16/2021   Procedure: TRANSESOPHAGEAL ECHOCARDIOGRAM (TEE);  Surgeon: Alleen Borne, MD;  Location: [facility] OR;  Service: Open Heart Surgery;  Laterality: N/A;   TEE WITHOUT CARDIOVERSION N/A 09/28/2021   Procedure: TRANSESOPHAGEAL ECHOCARDIOGRAM (TEE);  Surgeon: Alleen Borne, MD;  Location: [facility] OR;  Service: Thoracic;  Laterality: N/A;   TONSILLECTOMY     TRANSESOPHAGEAL ECHOCARDIOGRAM (CATH LAB) N/A 04/17/2023   Procedure: TRANSESOPHAGEAL ECHOCARDIOGRAM;  Surgeon: Jake Bathe, MD;  Location: [facility] INVASIVE CV LAB;  Service: Cardiovascular;  Laterality: N/A;   TRANSURETHRAL RESECTION OF BLADDER TUMOR N/A 10/21/2019   Procedure: TRANSURETHRAL RESECTION OF BLADDER TUMOR (TURBT);  Surgeon: Ihor Gully, MD;  Location: [facility];  Service: Urology;  Laterality: N/A;  TRANSURETHRAL RESECTION OF BLADDER TUMOR N/A 12/13/2019   Procedure: TRANSURETHRAL RESECTION OF BLADDER TUMOR (TURBT);  Surgeon: Sebastian Ache, MD;  Location: [facility];  Service: Urology;  Laterality: N/A;  1 HR   TRANSURETHRAL RESECTION OF BLADDER TUMOR N/A 10/14/2020   Procedure: TRANSURETHRAL RESECTION OF BLADDER TUMOR (TURBT);  Surgeon: Sebastian Ache, MD;  Location: [facility];  Service: Urology;  Laterality: N/A;   TRANSURETHRAL RESECTION OF BLADDER TUMOR N/A 12/16/2020   Procedure: RESTAGING TRANSURETHRAL RESECTION OF BLADDER TUMOR (TURBT);  Surgeon: Sebastian Ache, MD;  Location: [facility];  Service: Urology;  Laterality: N/A;   TRANSURETHRAL RESECTION OF BLADDER TUMOR N/A 06/03/2022   Procedure: TRANSURETHRAL RESECTION OF  BLADDER TUMOR (TURBT);  Surgeon: Sebastian Ache, MD;  Location: [facility];  Service: Urology;  Laterality: N/A;   UMBILICAL HERNIA REPAIR  03/03/2023   Procedure: HERNIA REPAIR UMBILICAL;  Surgeon: Loletta Parish., MD;  Location: [facility];  Service: Urology;;    Family History  Problem Relation Age of Onset   Lung cancer Mother        lung   Esophageal cancer Cousin    Colon cancer Neg Hx    Rectal cancer Neg Hx    Stomach cancer Neg Hx       Social History   Socioeconomic History   Marital status: Married    Spouse name: Not on file   Number of children: 4   Years of education: Not on file   Highest education level: Not on file  Occupational History   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  Tobacco Use   Smoking status: Former    Current packs/day: 0.00    Average packs/day: 2.0 packs/day for 40.0 years (80.0 ttl pk-yrs)    Types: Cigarettes    Start date: 66    Quit date: 2012    Years since quitting: 13.1    Passive exposure: Never   Smokeless tobacco: Never  Vaping Use   Vaping status: Never Used  Substance and Sexual Activity   Alcohol use: No    Alcohol/week: 0.0 standard drinks of alcohol   Drug use: No   Sexual activity: Not on file  Other Topics Concern   Not on file  Social History Narrative   Not on file   Social Drivers of Health   Financial Resource Strain: Low Risk  (11/03/2022)   Overall Financial Resource Strain (CARDIA)    Difficulty of Paying Living Expenses: Not hard at all  Food Insecurity: No Food Insecurity (05/09/2023)   Hunger Vital Sign    Worried About Running Out of Food in the Last Year: Never true    Ran Out of Food in the Last Year: Never true  Transportation Needs: No Transportation Needs (05/09/2023)   PRAPARE - Administrator, Civil Service (Medical): No    Lack of Transportation (Non-Medical): No  Physical Activity: Unknown (11/03/2022)   Exercise Vital Sign    Days of Exercise per  Week: Patient unable to answer    Minutes of Exercise per Session: 30 min  Stress: No Stress Concern Present (11/03/2022)   [employer] of Occupational Health - Occupational Stress Questionnaire    Feeling of Stress : Not at all  Social Connections: Moderately Isolated (11/03/2022)   Social Connection and Isolation Panel [NHANES]    Frequency of Communication with Friends and Family: More than three times a week    Frequency of Social  Gatherings with Friends and Family: More than three times a week    Attends Religious Services: Never    Database administrator or Organizations: No    Attends Banker Meetings: Never    Marital Status: Married    No Known Allergies   Current Outpatient Medications:    acetaminophen (TYLENOL) 325 MG tablet, Take 650 mg by mouth every 6 (six) hours as needed (for pain)., Disp: , Rfl:    amLODipine (NORVASC) 5 MG tablet, Take 1 tablet (5 mg total) by mouth daily., Disp: 90 tablet, Rfl: 1   aspirin EC 81 MG tablet, Take 81 mg by mouth in the morning., Disp: , Rfl:    carvedilol (COREG) 3.125 MG tablet, TAKE 1 TABLET BY MOUTH TWICE A DAY WITH FOOD, Disp: 180 tablet, Rfl: 1   diazepam (VALIUM) 5 MG tablet, TAKE 1 TABLET BY MOUTH EVERY 12 HOURS AS NEEDED FOR ANXIETY. (Patient taking differently: Take 5 mg by mouth every 12 (twelve) hours as needed for anxiety.), Disp: 60 tablet, Rfl: 5   ferrous sulfate 325 (65 FE) MG tablet, Take 325 mg by mouth daily with breakfast., Disp: , Rfl:    furosemide (LASIX) 20 MG tablet, Take 2 tablets (40 mg total) by mouth as needed., Disp: 45 tablet, Rfl: 3   ipratropium (ATROVENT) 0.03 % nasal spray, Place 2 sprays into both nostrils every 12 (twelve) hours as needed for rhinitis., Disp: , Rfl:    JARDIANCE 10 MG TABS tablet, Take 1 tablet (10 mg total) by mouth daily., Disp: 30 tablet, Rfl: 11   lidocaine-prilocaine (EMLA) cream, Apply 1 Application topically as needed., Disp: 30 g, Rfl: 0   Magnesium 500 MG  TABS, Take 500 mg by mouth in the morning., Disp: , Rfl:    meclizine (ANTIVERT) 25 MG tablet, Take 1 tablet (25 mg total) by mouth 3 (three) times daily as needed for dizziness., Disp: 30 tablet, Rfl: 0   multivitamin-iron-minerals-folic acid (CENTRUM) chewable tablet, Chew 1 tablet by mouth daily., Disp: , Rfl:    nitroGLYCERIN (NITROSTAT) 0.4 MG SL tablet, Place 0.4 mg under the tongue every 5 (five) minutes as needed for chest pain., Disp: , Rfl:    ondansetron (ZOFRAN) 8 MG tablet, Take 1 tablet (8 mg total) by mouth every 8 (eight) hours as needed., Disp: 30 tablet, Rfl: 0   potassium chloride SA (KLOR-CON M20) 20 MEQ tablet, Take 1 tablet (20 mEq total) by mouth daily., Disp: , Rfl:    prochlorperazine (COMPAZINE) 10 MG tablet, Take 1 tablet (10 mg total) by mouth every 6 (six) hours as needed for nausea or vomiting., Disp: 30 tablet, Rfl: 0   rosuvastatin (CRESTOR) 20 MG tablet, TAKE 1 TABLET BY MOUTH EVERYDAY AT BEDTIME (Patient taking differently: Take 20 mg by mouth at bedtime.), Disp: 90 tablet, Rfl: 3   venlafaxine XR (EFFEXOR-XR) 150 MG 24 hr capsule, TAKE 1 CAPSULE BY MOUTH DAILY WITH BREAKFAST., Disp: 90 capsule, Rfl: 0    Review of Systems  Constitutional:  Positive for activity change, appetite change, chills and fatigue. Negative for diaphoresis, fever and unexpected weight change.  HENT:  Negative for congestion, rhinorrhea, sinus pressure, sneezing, sore throat and trouble swallowing.   Eyes:  Negative for photophobia and visual disturbance.  Respiratory:  Negative for cough, chest tightness, shortness of breath, wheezing and stridor.   Cardiovascular:  Negative for chest pain, palpitations and leg swelling.  Gastrointestinal:  Negative for abdominal distention, abdominal pain, anal bleeding, blood in  stool, constipation, diarrhea, nausea and vomiting.  Genitourinary:  Negative for difficulty urinating, dysuria, flank pain and hematuria.  Musculoskeletal:  Negative for  arthralgias, back pain, gait problem, joint swelling and myalgias.  Skin:  Negative for color change, pallor, rash and wound.  Neurological:  Negative for dizziness, tremors, weakness and light-headedness.  Hematological:  Negative for adenopathy. Does not bruise/bleed easily.  Psychiatric/Behavioral:  Negative for agitation, behavioral problems, confusion, decreased concentration, dysphoric mood and sleep disturbance.        Objective:   Physical Exam Constitutional:      Appearance: He is well-developed.  HENT:     Head: Normocephalic and atraumatic.  Eyes:     Conjunctiva/sclera: Conjunctivae normal.  Cardiovascular:     Rate and Rhythm: Normal rate and regular rhythm.     Heart sounds: No murmur heard.    No friction rub. No gallop.  Pulmonary:     Effort: Pulmonary effort is normal. No respiratory distress.     Breath sounds: No stridor. No wheezing or rhonchi.  Abdominal:     General: There is no distension.     Palpations: Abdomen is soft.  Musculoskeletal:        General: No tenderness. Normal range of motion.     Cervical back: Normal range of motion and neck supple.  Skin:    General: Skin is warm and dry.     Coloration: Skin is not pale.     Findings: No erythema or rash.  Neurological:     General: No focal deficit present.     Mental Status: He is alert and oriented to person, place, and time.  Psychiatric:        Mood and Affect: Mood normal.        Behavior: Behavior normal.        Thought Content: Thought content normal.        Judgment: Judgment normal.           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 These symptoms are similar to those experienced prior to his previous bloodstream infection. No fever, dizziness, or lightheadedness reported. -Order blood cultures today to rule out bloodstream infection. -Encourage patient to reach out if symptoms persist or worsen.  Chronic Kidney Disease Elevated creatinine levels noted, with recent discontinuation of Lasix and Jardiance. Patient is unable to receive chemotherapy due to kidney function. -Continue monitoring kidney function closely.  Bladder Cancer Patient has history of bladder cancer and is currently not receiving chemotherapy due to kidney function. Patient is considering radiation therapy. -Continue current cancer management plan under oncology.  Hypertension Patient reports elevated blood pressure readings and has been prescribed an additional blood thinner by his oncologist. -Continue current hypertension management plan under cardiology.  Artificial Heart Valve and Aortic Graft Patient has history of aortic aneurysm repair with graft and artificial heart valve placement. Cardiac catheterization is scheduled to assess heart pump function. -Continue current cardiac management plan under cardiology.  Follow-up Patient has an appointment with oncology next week. -Encourage patient to keep all scheduled appointments and to reach out as needed for any concerns.

## 2023-06-07 ENCOUNTER — Other Ambulatory Visit: Payer: Self-pay

## 2023-06-07 ENCOUNTER — Telehealth: Payer: Self-pay

## 2023-06-07 ENCOUNTER — Telehealth (HOSPITAL_COMMUNITY): Payer: Self-pay | Admitting: Family Medicine

## 2023-06-07 ENCOUNTER — Encounter: Payer: Self-pay | Admitting: Infectious Disease

## 2023-06-07 ENCOUNTER — Other Ambulatory Visit (HOSPITAL_COMMUNITY): Payer: Self-pay

## 2023-06-07 ENCOUNTER — Ambulatory Visit: Payer: PPO | Admitting: Infectious Disease

## 2023-06-07 ENCOUNTER — Telehealth (HOSPITAL_COMMUNITY): Payer: Self-pay

## 2023-06-07 ENCOUNTER — Encounter (HOSPITAL_COMMUNITY): Payer: Self-pay

## 2023-06-07 VITALS — BP 127/73 | HR 56 | Resp 16 | Ht 66.0 in | Wt 260.2 lb

## 2023-06-07 DIAGNOSIS — R7881 Bacteremia: Secondary | ICD-10-CM | POA: Diagnosis not present

## 2023-06-07 DIAGNOSIS — Z954 Presence of other heart-valve replacement: Secondary | ICD-10-CM

## 2023-06-07 DIAGNOSIS — R630 Anorexia: Secondary | ICD-10-CM | POA: Diagnosis not present

## 2023-06-07 DIAGNOSIS — A498 Other bacterial infections of unspecified site: Secondary | ICD-10-CM

## 2023-06-07 DIAGNOSIS — G4733 Obstructive sleep apnea (adult) (pediatric): Secondary | ICD-10-CM | POA: Diagnosis not present

## 2023-06-07 DIAGNOSIS — G4710 Hypersomnia, unspecified: Secondary | ICD-10-CM | POA: Diagnosis not present

## 2023-06-07 DIAGNOSIS — C679 Malignant neoplasm of bladder, unspecified: Secondary | ICD-10-CM | POA: Diagnosis not present

## 2023-06-07 DIAGNOSIS — R5381 Other malaise: Secondary | ICD-10-CM

## 2023-06-07 DIAGNOSIS — I5032 Chronic diastolic (congestive) heart failure: Secondary | ICD-10-CM

## 2023-06-07 DIAGNOSIS — Z952 Presence of prosthetic heart valve: Secondary | ICD-10-CM

## 2023-06-07 DIAGNOSIS — R6883 Chills (without fever): Secondary | ICD-10-CM | POA: Diagnosis not present

## 2023-06-07 NOTE — Telephone Encounter (Signed)
Can you send an urgent referral to [facility]   Referral faxed and confirmation received

## 2023-06-07 NOTE — Telephone Encounter (Signed)
Called patient and scheduled RHC for Thursday 06/15/23 at 9 am. No pre cert required as it was checked by the PA team. Instructions given to patient and spouse, they verbalized understanding and letter of instructions also sent via my chart

## 2023-06-07 NOTE — Telephone Encounter (Signed)
Patient's creatinine has been climbing, despite de-escalation of diuretics. Unable to proceed with cancer treatments with rising creatinine. I have been in correspondence with [name], PA with Heme/Onc. Renal ultrasound has been completed and was negative for acute processes. Spoke with Dr. Shirlee Latch, and recommend RHC soon to evaluate hemodynamics. I spoke with [name] and his wife about procedure and he is agreeable to proceed. Will arrange RHC with Dr. Shirlee Latch at next available.  Informed Consent   Shared Decision Making/Informed Consent The risks [stroke (1 in 1000), death (1 in 1000), kidney failure [usually temporary] (1 in 500), bleeding (1 in 200), allergic reaction [possibly serious] (1 in 200)], benefits (diagnostic support and management of coronary artery disease) and alternatives of a cardiac catheterization were discussed in detail with [name] and he is willing to proceed.      Luis Rome, FNP-BC 06/07/23

## 2023-06-08 ENCOUNTER — Ambulatory Visit
Admission: RE | Admit: 2023-06-08 | Discharge: 2023-06-08 | Discharge status: Home / Self Care | Payer: PPO | Source: Ambulatory Visit | Attending: Urology | Admitting: Urology

## 2023-06-08 ENCOUNTER — Ambulatory Visit
Admission: RE | Admit: 2023-06-08 | Discharge: 2023-06-08 | Disposition: A | Discharge status: Home / Self Care | Payer: PPO | Source: Ambulatory Visit | Attending: Radiation Oncology | Admitting: Radiation Oncology

## 2023-06-08 ENCOUNTER — Encounter: Payer: Self-pay | Admitting: Urology

## 2023-06-08 VITALS — BP 158/74 | HR 69 | Temp 97.8°F | Resp 20 | Ht 66.0 in | Wt 261.0 lb

## 2023-06-08 DIAGNOSIS — Z87891 Personal history of nicotine dependence: Secondary | ICD-10-CM | POA: Diagnosis not present

## 2023-06-08 DIAGNOSIS — E785 Hyperlipidemia, unspecified: Secondary | ICD-10-CM | POA: Insufficient documentation

## 2023-06-08 DIAGNOSIS — I219 Acute myocardial infarction, unspecified: Secondary | ICD-10-CM | POA: Diagnosis not present

## 2023-06-08 DIAGNOSIS — K219 Gastro-esophageal reflux disease without esophagitis: Secondary | ICD-10-CM | POA: Insufficient documentation

## 2023-06-08 DIAGNOSIS — Z800 Family history of malignant neoplasm of digestive organs: Secondary | ICD-10-CM | POA: Diagnosis not present

## 2023-06-08 DIAGNOSIS — C678 Malignant neoplasm of overlapping sites of bladder: Secondary | ICD-10-CM

## 2023-06-08 DIAGNOSIS — I2510 Atherosclerotic heart disease of native coronary artery without angina pectoris: Secondary | ICD-10-CM | POA: Insufficient documentation

## 2023-06-08 DIAGNOSIS — R011 Cardiac murmur, unspecified: Secondary | ICD-10-CM | POA: Insufficient documentation

## 2023-06-08 DIAGNOSIS — I10 Essential (primary) hypertension: Secondary | ICD-10-CM | POA: Insufficient documentation

## 2023-06-08 DIAGNOSIS — I350 Nonrheumatic aortic (valve) stenosis: Secondary | ICD-10-CM | POA: Diagnosis not present

## 2023-06-08 DIAGNOSIS — G4730 Sleep apnea, unspecified: Secondary | ICD-10-CM | POA: Insufficient documentation

## 2023-06-08 DIAGNOSIS — Z801 Family history of malignant neoplasm of trachea, bronchus and lung: Secondary | ICD-10-CM | POA: Insufficient documentation

## 2023-06-08 DIAGNOSIS — I5032 Chronic diastolic (congestive) heart failure: Secondary | ICD-10-CM | POA: Insufficient documentation

## 2023-06-08 DIAGNOSIS — I7121 Aneurysm of the ascending aorta, without rupture: Secondary | ICD-10-CM | POA: Insufficient documentation

## 2023-06-08 NOTE — Progress Notes (Signed)
Nursing interview for  BCG-Unresponsive High Risk Non-Muscle Invasive Bladder Cancer.   Patient identity verified x2.  Patient reports fatigue, moderate insomnia, reduced appetite, cold chills, occasional diarrhea.  Patient denies fevers, sweats, shortness of breath, chest pain, cough, headaches, dizziness, or peripheral edema. No other other related issues conveyed at this time.  Meaningful use complete.  Vitals- BP (!) 158/74   Pulse 69   Temp 97.8 F (36.6 C) (Temporal)   Resp 20   Ht 5\' 6"  (1.676 m)   Wt 261 lb (118.4 kg)   SpO2 100%   BMI 42.13 kg/m   This concludes the interaction.  Ruel Favors, LPN

## 2023-06-08 NOTE — Progress Notes (Signed)
Radiation Oncology         [phone]) [phone] ________________________________  Outpatient follow-up  Name: Luis [name] MRN: [id number]  Date of Service: 06/08/2023 DOB: 25-Feb-1956  CC:Fry, Tera Mater, MD  Jaci Standard, MD   REFERRING PHYSICIAN: Jaci Standard, MD  DIAGNOSIS: 68 y/o man with locally advanced, pT4aN1, high grade invasive urothelial cell carcinoma of the bladder, s/p cystoprostatectomy 03/03/2023    ICD-10-CM   1. Malignant neoplasm of overlapping sites of bladder [facility])  C67.8       HISTORY OF PRESENT ILLNESS: Luis [name] is a 68 y.o. male seen at the request of Dr. [name]. He is an established urology patient who initially presented with gross hematuria back in 2021. He was found to have a bladder tumor and underwent TURBT with gemcitabine instillation on 10/21/19 under the care of Dr. Vernie Ammons. Pathology showed high grade, nonmuscle invasive, papillary urothelial carcinoma.  He completed an initial course of induction BCG treatments x 6 in September 2021 and his initial posttreatment cystoscopy showed CIS only so he continued in observation.  A surveillance cystoscopy in 09/2020 confirmed a recurrence in the left bladder neck so he proceeded with repeat TURBT on 10/14/2020 with pathology confirming recurrent high-grade nonmuscle invasive urothelial carcinoma.  He completed another 6 cycles of BCG in November 2022 and a surveillance in office cystoscopy in March 2023 was negative. He unfortunately developed a second recurrence involving the prostatic urethra, on repeat TURBT in 05/2022, still non-muscle invasive. He was subsequently referred to Dr. [name] on 07/29/22 to discuss potential systemic treatment options for BCG refractory urothelial carcinoma. He subsequently completed 8 cycles of pembrolizumab from 08/12/22 through 01/13/23.  Surveillance cystoscopy in August 2024 showed further progression and a CT A/P on 12/21/2022 showed enhancing nodular soft tissue and wall  thickening along the left bladder dome with small lower abdominal and retroperitoneal lymph nodes, increased as compared to his prior scan from 09/28/2021, despite immunotherapy, so he ultimately proceeded to definitive cystoprostatectomy on 03/03/23 under the care of Dr. [name]. Unfortunately, pathology showed a 5.8 cm, pT4aN1, infiltrative high-grade urothelial carcinoma, involving the bladder dome, anterior wall, and the prostatic stroma, invading directly into the stroma at the apex and mid portions of the prostate gland. Additionally, one left common iliac lymph node was positive for metastatic urothelial carcinoma (1/2). Resection margins were positive for urothelial carcinoma in situ (CIS) at the apex urethral and right ureter.  All other margins of resection were negative for carcinoma.   He was hospitalized from 04/13/23 through 04/18/23 due to postoperative urosepsis and remains on IV antibiotics at home until 05/12/2023.  He had a follow-up visit with Dr. [name] on 04/20/2023 who recommended adjuvant chemoradiation, using the sandwich method.  We met with the patient on 05/09/2023 to discuss the radiation treatment and he was planning to start systemic therapy after completion of the IV antibiotics.  He had a CT chest to complete his disease staging on 05/16/2023 and this was without any evidence of metastatic disease in the chest.  He had port placement on 05/29/2023 but unfortunately, his kidney function has prevented him from being able to start systemic therapy as planned so he has been kindly referred back to [place] today to discuss the potential role for upfront radiotherapy followed by systemic chemotherapy.  PREVIOUS RADIATION THERAPY: No  PAST MEDICAL HISTORY:  Past Medical History:  Diagnosis Date   Anxiety    takes Valium as needed   Aortic stenosis, moderate  Arthritis    Ascending aortic aneurysm (HCC)    CAD (coronary artery disease)    a. s/p PCI of the RCA 8/12 with DES by Dr Excell Seltzer,  preserved EF. b. LHC/RHC (2/16) with mean RA 12, PA 32/15, mean PCWP 18, CI 3.47; patent mid and distal RCA stents, 50-60% proximal stenosis small PDA.      Cancer [facility])    bladder   Carotid stenosis    a. Carotid US (05/2013):  Bilateral 1-39% ICA; L thyroid nodule (prior hx of aspiration).   Chronic diastolic CHF (congestive heart failure) (HCC)    Complication of anesthesia    difficulty waking up after gallbladder surgery   Depression    Dyspnea    Essential hypertension    GERD (gastroesophageal reflux disease)    if needed will take OTC meds    Heart murmur    History of colonic polyps    hyperplastic   Hyperlipidemia    Joint pain    Lesion of bladder    Myocardial infarction (HCC) 2012   Obesity (BMI 30-39.9) 02/29/2016   Pre-diabetes    Restless leg    Sleep apnea    uses cpap   Tubular adenoma of colon    Vertigo    takes Meclizine as needed      PAST SURGICAL HISTORY: Past Surgical History:  Procedure Laterality Date   AORTIC VALVE REPLACEMENT N/A 09/16/2021   Procedure: AORTIC VALVE REPLACEMENT (AVR);  Surgeon: Alleen Borne, MD;  Location: [facility] OR;  Service: Open Heart Surgery;  Laterality: N/A;   CATARACT EXTRACTION   4 YRS AGO   BOTH EYES   CHOLECYSTECTOMY  07/21/2011   Procedure: LAPAROSCOPIC CHOLECYSTECTOMY WITH INTRAOPERATIVE CHOLANGIOGRAM;  Surgeon: Kandis Cocking, MD;  Location: [facility] ORS;  Service: General;  Laterality: N/A;   CORONARY ANGIOPLASTY  2012   2 stents   coronary stenting     s/p PCI of the RCA by Dr Excell Seltzer 8/12 with 2 promus stents   CYSTOSCOPY W/ RETROGRADES Bilateral 12/13/2019   Procedure: CYSTOSCOPY WITH RETROGRADE PYELOGRAM;  Surgeon: Sebastian Ache, MD;  Location: [facility];  Service: Urology;  Laterality: Bilateral;   CYSTOSCOPY W/ RETROGRADES Bilateral 10/14/2020   Procedure: CYSTOSCOPY WITH RETROGRADE PYELOGRAM;  Surgeon: Sebastian Ache, MD;  Location: [facility];  Service: Urology;  Laterality:  Bilateral;   CYSTOSCOPY W/ RETROGRADES Bilateral 12/16/2020   Procedure: CYSTOSCOPY WITH RETROGRADE PYELOGRAM;  Surgeon: Sebastian Ache, MD;  Location: [facility];  Service: Urology;  Laterality: Bilateral;   CYSTOSCOPY W/ RETROGRADES Bilateral 06/03/2022   Procedure: CYSTOSCOPY WITH RETROGRADE PYELOGRAM;  Surgeon: Sebastian Ache, MD;  Location: [facility];  Service: Urology;  Laterality: Bilateral;   CYSTOSCOPY W/ RETROGRADES Bilateral 12/28/2022   Procedure: CYSTOSCOPY WITH RETROGRADE PYELOGRAM, FULGARATION OF BLEEDERS;  Surgeon: Loletta Parish., MD;  Location: [facility];  Service: Urology;  Laterality: Bilateral;   CYSTOSCOPY WITH INJECTION N/A 03/03/2023   Procedure: CYSTOSCOPY WITH INDOCYANINE INJECTION;  Surgeon: Loletta Parish., MD;  Location: [facility];  Service: Urology;  Laterality: N/A;  360 MINUTES   IR IMAGING GUIDED PORT INSERTION  05/29/2023   LEFT AND RIGHT HEART CATHETERIZATION WITH CORONARY ANGIOGRAM N/A 06/23/2014   Procedure: LEFT AND RIGHT HEART CATHETERIZATION WITH CORONARY ANGIOGRAM;  Surgeon: Laurey Morale, MD;  Location: [facility] CATH LAB;  Service: Cardiovascular;  Laterality: N/A;   NECK SURGERY  03/23/09   per Dr. Ophelia Charter, cervical fusion    PERICARDIOCENTESIS N/A 09/28/2021  Procedure: PERICARDIOCENTESIS;  Surgeon: Corky Crafts, MD;  Location: [facility] INVASIVE CV LAB;  Service: Cardiovascular;  Laterality: N/A;   REPLACEMENT ASCENDING AORTA N/A 09/16/2021   Procedure: REPLACEMENT ASCENDING AORTA WITH 30 X HEMASHIELD PLATINUM WOVEN DOUBLE VELOUR VASCULAR GRAFT;  Surgeon: Alleen Borne, MD;  Location: [facility] OR;  Service: Open Heart Surgery;  Laterality: N/A;  CIRC ARREST   right elbow surgery     RIGHT HEART CATH AND CORONARY ANGIOGRAPHY N/A 07/08/2021   Procedure: RIGHT HEART CATH AND CORONARY ANGIOGRAPHY;  Surgeon: Laurey Morale, MD;  Location: [facility] INVASIVE CV LAB;  Service: Cardiovascular;  Laterality: N/A;   ROBOT ASSISTED LAPAROSCOPIC COMPLETE CYSTECT  ILEAL CONDUIT N/A 03/03/2023   Procedure: XI ROBOTIC ASSISTED LAPAROSCOPIC COMPLETE CYSTECTECTOMY WITH  ILEAL CONDUIT DIVERSION;  Surgeon: Loletta Parish., MD;  Location: [facility];  Service: Urology;  Laterality: N/A;   ROBOT ASSISTED LAPAROSCOPIC RADICAL PROSTATECTOMY N/A 03/03/2023   Procedure: XI ROBOTIC ASSISTED LAPAROSCOPIC RADICAL PROSTATECTOMY WITH LYMPH NODE DISSECTION;  Surgeon: Loletta Parish., MD;  Location: [facility];  Service: Urology;  Laterality: N/A;   solonscopy  05/23/08   per Dr. Celene Kras hemorrhoids only, repeat in 5 years   SUBXYPHOID PERICARDIAL WINDOW N/A 09/28/2021   Procedure: SUBXYPHOID PERICARDIAL WINDOW;  Surgeon: Alleen Borne, MD;  Location: [facility] OR;  Service: Thoracic;  Laterality: N/A;   TEE WITHOUT CARDIOVERSION N/A 06/23/2014   Procedure: TRANSESOPHAGEAL ECHOCARDIOGRAM (TEE);  Surgeon: Laurey Morale, MD;  Location: [facility] ENDOSCOPY;  Service: Cardiovascular;  Laterality: N/A;   TEE WITHOUT CARDIOVERSION N/A 01/21/2016   Procedure: TRANSESOPHAGEAL ECHOCARDIOGRAM (TEE);  Surgeon: Laurey Morale, MD;  Location: [facility] ENDOSCOPY;  Service: Cardiovascular;  Laterality: N/A;   TEE WITHOUT CARDIOVERSION N/A 09/16/2021   Procedure: TRANSESOPHAGEAL ECHOCARDIOGRAM (TEE);  Surgeon: Alleen Borne, MD;  Location: [facility] OR;  Service: Open Heart Surgery;  Laterality: N/A;   TEE WITHOUT CARDIOVERSION N/A 09/28/2021   Procedure: TRANSESOPHAGEAL ECHOCARDIOGRAM (TEE);  Surgeon: Alleen Borne, MD;  Location: [facility] OR;  Service: Thoracic;  Laterality: N/A;   TONSILLECTOMY     TRANSESOPHAGEAL ECHOCARDIOGRAM (CATH LAB) N/A 04/17/2023   Procedure: TRANSESOPHAGEAL ECHOCARDIOGRAM;  Surgeon: Jake Bathe, MD;  Location: [facility] INVASIVE CV LAB;  Service: Cardiovascular;  Laterality: N/A;   TRANSURETHRAL RESECTION OF BLADDER TUMOR N/A 10/21/2019   Procedure: TRANSURETHRAL RESECTION OF BLADDER TUMOR (TURBT);  Surgeon: Ihor Gully, MD;  Location: [facility];  Service: Urology;   Laterality: N/A;   TRANSURETHRAL RESECTION OF BLADDER TUMOR N/A 12/13/2019   Procedure: TRANSURETHRAL RESECTION OF BLADDER TUMOR (TURBT);  Surgeon: Sebastian Ache, MD;  Location[facility];  Service: Urology;  Laterality: N/A;  1 HR   TRANSURETHRAL RESECTION OF BLADDER TUMOR N/A 10/14/2020   Procedure: TRANSURETHRAL RESECTION OF BLADDER TUMOR (TURBT);  Surgeon: Sebastian Ache, MD;  Location: [facility];  Service: Urology;  Laterality: N/A;   TRANSURETHRAL RESECTION OF BLADDER TUMOR N/A 12/16/2020   Procedure: RESTAGING TRANSURETHRAL RESECTION OF BLADDER TUMOR (TURBT);  Surgeon: Sebastian Ache, MD;  Location: [facility];  Service: Urology;  Laterality: N/A;   TRANSURETHRAL RESECTION OF BLADDER TUMOR N/A 06/03/2022   Procedure: TRANSURETHRAL RESECTION OF BLADDER TUMOR (TURBT);  Surgeon: Sebastian Ache, MD;  Location: [facility];  Service: Urology;  Laterality: N/A;   UMBILICAL HERNIA REPAIR  03/03/2023   Procedure: HERNIA REPAIR UMBILICAL;  Surgeon: Loletta Parish., MD;  Location: [facility];  Service: Urology;;    FAMILY HISTORY:  Family History  Problem Relation Age of Onset   Lung cancer Mother        lung   Esophageal cancer Cousin    Colon cancer Neg Hx    Rectal cancer Neg Hx    Stomach cancer Neg Hx     SOCIAL HISTORY:  Social History   Socioeconomic History   Marital status: Married    Spouse name: Not on file   Number of children: 4   Years of education: Not on file   Highest education level: Not on file  Occupational History   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  Tobacco Use   Smoking status: Former    Current packs/day: 0.00    Average packs/day: 2.0 packs/day for 40.0 years (80.0 ttl pk-yrs)    Types: Cigarettes    Start date: 89    Quit date: 2012    Years since quitting: 13.1    Passive exposure: Never   Smokeless tobacco: Never  Vaping Use   Vaping status: Never Used  Substance  and Sexual Activity   Alcohol use: No    Alcohol/week: 0.0 standard drinks of alcohol   Drug use: No   Sexual activity: Not on file  Other Topics Concern   Not on file  Social History Narrative   Not on file   Social Drivers of Health   Financial Resource Strain: Low Risk  (11/03/2022)   Overall Financial Resource Strain (CARDIA)    Difficulty of Paying Living Expenses: Not hard at all  Food Insecurity: No Food Insecurity (05/09/2023)   Hunger Vital Sign    Worried About Running Out of Food in the Last Year: Never true    Ran Out of Food in the Last Year: Never true  Transportation Needs: No Transportation Needs (05/09/2023)   PRAPARE - Administrator, Civil Service (Medical): No    Lack of Transportation (Non-Medical): No  Physical Activity: Unknown (11/03/2022)   Exercise Vital Sign    Days of Exercise per Week: Patient unable to answer    Minutes of Exercise per Session: 30 min  Stress: No Stress Concern Present (11/03/2022)   [employer] of Occupational Health - Occupational Stress Questionnaire    Feeling of Stress : Not at all  Social Connections: Moderately Isolated (11/03/2022)   Social Connection and Isolation Panel [NHANES]    Frequency of Communication with Friends and Family: More than three times a week    Frequency of Social Gatherings with Friends and Family: More than three times a week    Attends Religious Services: Never    Database administrator or Organizations: No    Attends Banker Meetings: Never    Marital Status: Married  Catering manager Violence: Not At Risk (05/09/2023)   Humiliation, Afraid, Rape, and Kick questionnaire    Fear of Current or Ex-Partner: No    Emotionally Abused: No    Physically Abused: No    Sexually Abused: No    ALLERGIES: Patient has no known allergies.  MEDICATIONS:  Current Outpatient Medications  Medication Sig Dispense Refill   furosemide (LASIX) 20 MG tablet Take 2 tablets (40 mg total) by  mouth as needed. 45 tablet 3   acetaminophen (TYLENOL) 325 MG tablet Take 650 mg by mouth every 6 (six) hours as needed (for pain).     amLODipine (NORVASC) 5 MG tablet Take 1 tablet (5 mg total) by mouth daily. 90 tablet 1  aspirin EC 81 MG tablet Take 81 mg by mouth in the morning.     carvedilol (COREG) 3.125 MG tablet TAKE 1 TABLET BY MOUTH TWICE A DAY WITH FOOD 180 tablet 1   diazepam (VALIUM) 5 MG tablet TAKE 1 TABLET BY MOUTH EVERY 12 HOURS AS NEEDED FOR ANXIETY. (Patient taking differently: Take 5 mg by mouth every 12 (twelve) hours as needed for anxiety.) 60 tablet 5   ferrous sulfate 325 (65 FE) MG tablet Take 325 mg by mouth daily with breakfast.     ipratropium (ATROVENT) 0.03 % nasal spray Place 2 sprays into both nostrils every 12 (twelve) hours as needed for rhinitis.     JARDIANCE 10 MG TABS tablet Take 1 tablet (10 mg total) by mouth daily. (Patient not taking: Reported on 06/07/2023) 30 tablet 11   lidocaine-prilocaine (EMLA) cream Apply 1 Application topically as needed. 30 g 0   Magnesium 500 MG TABS Take 500 mg by mouth in the morning.     meclizine (ANTIVERT) 25 MG tablet Take 1 tablet (25 mg total) by mouth 3 (three) times daily as needed for dizziness. 30 tablet 0   multivitamin-iron-minerals-folic acid (CENTRUM) chewable tablet Chew 1 tablet by mouth daily.     nitroGLYCERIN (NITROSTAT) 0.4 MG SL tablet Place 0.4 mg under the tongue every 5 (five) minutes as needed for chest pain.     ondansetron (ZOFRAN) 8 MG tablet Take 1 tablet (8 mg total) by mouth every 8 (eight) hours as needed. 30 tablet 0   potassium chloride SA (KLOR-CON M20) 20 MEQ tablet Take 1 tablet (20 mEq total) by mouth daily.     prochlorperazine (COMPAZINE) 10 MG tablet Take 1 tablet (10 mg total) by mouth every 6 (six) hours as needed for nausea or vomiting. 30 tablet 0   rosuvastatin (CRESTOR) 20 MG tablet TAKE 1 TABLET BY MOUTH EVERYDAY AT BEDTIME (Patient taking differently: Take 20 mg by mouth at  bedtime.) 90 tablet 3   venlafaxine XR (EFFEXOR-XR) 150 MG 24 hr capsule TAKE 1 CAPSULE BY MOUTH DAILY WITH BREAKFAST. 90 capsule 0   No current facility-administered medications for this encounter.    REVIEW OF SYSTEMS:  On review of systems, the patient reports that he is doing well overall. He denies any chest pain, shortness of breath, cough, fevers, chills, night sweats, or recent significant unintended weight loss. He denies any bowel or bladder disturbances, and denies abdominal pain, nausea or vomiting.  He is doing well, managing his new urostomy.  He denies any new musculoskeletal or joint aches or pains. A complete review of systems is obtained and is otherwise negative.    PHYSICAL EXAM:  Wt Readings from Last 3 Encounters:  06/08/23 261 lb (118.4 kg)  06/07/23 260 lb 3.2 oz (118 kg)  06/06/23 260 lb 4.8 oz (118.1 kg)   Temp Readings from Last 3 Encounters:  06/08/23 97.8 F (36.6 C) (Temporal)  06/06/23 98.6 F (37 C) (Temporal)  05/30/23 98.2 F (36.8 C) (Temporal)   BP Readings from Last 3 Encounters:  06/08/23 (!) 158/74  06/07/23 127/73  06/06/23 137/75   Pulse Readings from Last 3 Encounters:  06/08/23 69  06/07/23 (!) 56  06/06/23 (!) 58   Pain Assessment Pain Score: 0-No pain/10  In general this is a well appearing Caucasian man in no acute distress. He's alert and oriented x4 and appropriate throughout the examination. Cardiopulmonary assessment is negative for acute distress and he exhibits normal effort.  KPS = 100  100 - Normal; no complaints; no evidence of disease. 90   - Able to carry on normal activity; minor signs or symptoms of disease. 80   - Normal activity with effort; some signs or symptoms of disease. 22   - Cares for self; unable to carry on normal activity or to do active work. 60   - Requires occasional assistance, but is able to care for most of his personal needs. 50   - Requires considerable assistance and frequent medical  care. 40   - Disabled; requires special care and assistance. 30   - Severely disabled; hospital admission is indicated although death not imminent. 20   - Very sick; hospital admission necessary; active supportive treatment necessary. 10   - Moribund; fatal processes progressing rapidly. 0     - Dead  Karnofsky DA, Abelmann WH, Craver LS and Burchenal JH [phone]) The use of the nitrogen mustards in the palliative treatment of carcinoma: with particular reference to bronchogenic carcinoma Cancer 1 634-56  LABORATORY DATA:  Lab Results  Component Value Date   WBC 10.2 06/06/2023   HGB 8.2 (L) 06/06/2023   HCT 25.0 (L) 06/06/2023   MCV 86.8 06/06/2023   PLT 227 06/06/2023   Lab Results  Component Value Date   NA 135 06/06/2023   K 3.9 06/06/2023   CL 103 06/06/2023   CO2 24 06/06/2023   Lab Results  Component Value Date   ALT 17 06/06/2023   AST 17 06/06/2023   ALKPHOS 94 06/06/2023   BILITOT 0.5 06/06/2023     RADIOGRAPHY: US RENAL Result Date: 05/31/2023 CLINICAL DATA:  Renal insufficiency with elevated creatinine levels. Previous cystectomy and ileal conduit. Previous right ureteral stent. EXAM: RENAL / URINARY TRACT ULTRASOUND COMPLETE COMPARISON:  Right upper quadrant abdominal ultrasound dated 06/04/2011. Abdomen and pelvis CT dated 04/13/2023. FINDINGS: Right Kidney: Renal measurements: 12.7 x 5.7 x 5.5 cm = volume: 206 mL. Normal echotexture. Mild-to-moderate dilatation of the renal collecting system, similar to the previous CT. Left Kidney: Renal measurements: 13.0 x 5.7 x 5.7 cm = volume: 194 mL. Normal echotexture. Mild-to-moderate dilatation of the renal collecting system, similar to the previous CT. Exophytic simple cysts, unchanged. These do not need imaging follow-up. Bladder: Surgically absent. Other: Diffusely echogenic liver. Corresponding mild low density on the previous CT. IMPRESSION: 1. Mild-to-moderate bilateral hydronephrosis, similar to the previous CT. 2. Diffuse  hepatic steatosis. Electronically Signed   By: Beckie Salts M.D.   On: 05/31/2023 15:05   IR IMAGING GUIDED PORT INSERTION Result Date: 05/29/2023 CLINICAL DATA:  Bladder carcinoma, needs durable venous access for planned treatment regimen. EXAM: TUNNELED PORT CATHETER PLACEMENT WITH ULTRASOUND AND FLUOROSCOPIC GUIDANCE FLUOROSCOPY: Radiation Exposure Index (as provided by the fluoroscopic device): 27 mGy air Kerma ANESTHESIA/SEDATION: Intravenous Fentanyl and Versed 3mg  were administered by RN during a total moderate (conscious) sedation time of 19 minutes; the patient's level of consciousness and physiological / cardiorespiratory status were monitored continuously by radiology RN under my direct supervision. TECHNIQUE: The procedure, risks, benefits, and alternatives were explained to the patient. Questions regarding the procedure were encouraged and answered. The patient understands and consents to the procedure. Patency of the right IJ vein was confirmed with ultrasound with image documentation. An appropriate skin site was determined. Skin site was marked. Region was prepped using maximum barrier technique including cap and mask, sterile gown, sterile gloves, large sterile sheet, and Chlorhexidine as cutaneous antisepsis. The region was infiltrated locally with 1% lidocaine.  Under real-time ultrasound guidance, the right IJ vein was accessed with a 21 gauge micropuncture needle; the needle tip within the vein was confirmed with ultrasound image documentation. Needle was exchanged over a 018 guidewire for transitional dilator, and vascular measurement was performed. A small incision was made on the right anterior chest wall and a subcutaneous pocket fashioned. The power-injectable port was positioned and its catheter tunneled to the right IJ dermatotomy site. The transitional dilator was exchanged over an Amplatz wire for a peel-away sheath, through which the port catheter, which had been trimmed to the  appropriate length, was advanced and positioned under fluoroscopy with its tip at the cavoatrial junction. Spot chest radiograph confirms good catheter position and no pneumothorax. The port was flushed per protocol. The pocket was closed with deep interrupted and subcuticular continuous 3-0 Monocryl sutures. The incisions were covered with Dermabond then covered with a sterile dressing. The patient tolerated the procedure well. COMPLICATIONS: COMPLICATIONS None immediate IMPRESSION: Technically successful right IJ power-injectable port catheter placement. Ready for routine use. Electronically Signed   By: Corlis Leak M.D.   On: 05/29/2023 11:39   CT Chest W Contrast Result Date: 05/18/2023 CLINICAL DATA:  Bladder cancer, staging.  * Tracking Code: BO * EXAM: CT CHEST WITH CONTRAST TECHNIQUE: Multidetector CT imaging of the chest was performed during intravenous contrast administration. RADIATION DOSE REDUCTION: This exam was performed according to the departmental dose-optimization program which includes automated exposure control, adjustment of the mA and/or kV according to patient size and/or use of iterative reconstruction technique. CONTRAST:  75mL OMNIPAQUE IOHEXOL 300 MG/ML  SOLN COMPARISON:  Chest CT 04/18/2023 and 12/14/2022. FINDINGS: Cardiovascular: Stable postsurgical changes status post median sternotomy, aortic valve replacement and aortic root grafting. There is atherosclerosis of the aorta, great vessels and coronary arteries. No acute vascular findings are identified. The heart size is normal. There is no pericardial effusion. Mediastinum/Nodes: There are no enlarged mediastinal, hilar or axillary lymph nodes. The thyroid gland, trachea and esophagus demonstrate no significant findings. Lungs/Pleura: No pleural effusion or pneumothorax. The lungs are clear. No confluent airspace disease or suspicious pulmonary nodularity. Upper abdomen: Hepatic steatosis and prior cholecystectomy. No acute or  suspicious findings identified in the visualized upper abdomen. Musculoskeletal/Chest wall: There is no chest wall mass or suspicious osseous finding. Mild symmetric bilateral gynecomastia. Previous lower cervical fusion and median sternotomy. Multilevel spondylosis. IMPRESSION: 1. No evidence of metastatic disease in the chest. 2. Stable postsurgical changes status post median sternotomy, aortic valve replacement and aortic root grafting. 3. Hepatic steatosis. 4. Coronary and aortic Atherosclerosis (ICD10-I70.0). Electronically Signed   By: Carey Bullocks M.D.   On: 05/18/2023 12:00      IMPRESSION/PLAN: 1. 68 y.o. 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  We agree with the recommendation to proceed with radiotherapy upfront since his systemic therapy has been delayed due to his decreased renal function.  The recommendation is for a 5-week course of daily external beam radiation to the bladder.  We reviewed the anticipated acute and late sequelae associated with radiation in this setting. The patient and his wife were encouraged to ask questions that were answered to their stated satisfaction. He appears to have a good understanding of his disease and our treatment recommendations which are of curative intent.    At the end of our conversation, the patient is  in agreement to proceed with the recommended 5-week course of daily external beam radiation to the bladder to be followed by chemotherapy.  We will share our discussion with Dr. [name] and proceed with treatment planning/CT simulation at 8 AM on Friday, 06/09/2023, in anticipation of beginning his treatments in the near future.  He has freely signed written consent to proceed today in the office and a copy of this document will be placed in his medical record.  We enjoyed meeting  him and his wife again today and look forward to continuing to participate in his care.  They know that they are welcome to call at anytime with any questions or concerns that may arise in the interim.  We personally spent 45 minutes in this encounter including chart review, reviewing radiological studies, meeting face-to-face with the patient, entering orders, coordinating care and completing documentation.    Marguarite Arbour, PA-C    Margaretmary Dys, MD  [facility]  Radiation Oncology Direct Dial: [phone]  Fax: [phone] [email or web address]  Skype  [email or web address]

## 2023-06-09 ENCOUNTER — Ambulatory Visit
Admission: RE | Admit: 2023-06-09 | Discharge: 2023-06-09 | Disposition: A | Discharge status: Home / Self Care | Payer: PPO | Source: Ambulatory Visit | Attending: Radiation Oncology | Admitting: Radiation Oncology

## 2023-06-09 DIAGNOSIS — R5382 Chronic fatigue, unspecified: Secondary | ICD-10-CM | POA: Insufficient documentation

## 2023-06-09 DIAGNOSIS — R001 Bradycardia, unspecified: Secondary | ICD-10-CM | POA: Insufficient documentation

## 2023-06-09 DIAGNOSIS — I5032 Chronic diastolic (congestive) heart failure: Secondary | ICD-10-CM | POA: Diagnosis not present

## 2023-06-09 DIAGNOSIS — C775 Secondary and unspecified malignant neoplasm of intrapelvic lymph nodes: Secondary | ICD-10-CM | POA: Insufficient documentation

## 2023-06-09 DIAGNOSIS — C678 Malignant neoplasm of overlapping sites of bladder: Secondary | ICD-10-CM | POA: Diagnosis not present

## 2023-06-09 DIAGNOSIS — Z510 Encounter for antineoplastic radiation therapy: Secondary | ICD-10-CM | POA: Insufficient documentation

## 2023-06-09 DIAGNOSIS — C679 Malignant neoplasm of bladder, unspecified: Secondary | ICD-10-CM | POA: Insufficient documentation

## 2023-06-09 NOTE — Progress Notes (Signed)
  Radiation Oncology         [phone] ________________________________  Name: Luis [name] MRN: [id number]  Date: 06/09/2023  DOB: [birth year]  SIMULATION AND TREATMENT PLANNING NOTE    ICD-10-CM   1. 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  Identity was confirmed.  All relevant records and images related to the planned course of therapy were reviewed.  The patient freely provided informed written consent to proceed with treatment after reviewing the details related to the planned course of therapy. The consent form was witnessed and verified by the simulation staff.  Then, the patient was set-up in a stable reproducible  supine position for radiation therapy.  Contrast was instilled into the bladder with a catheter under sterile conditions.  CT images were obtained.  Surface markings were placed.  The CT images were loaded into the planning software.  Then the target and avoidance structures were contoured.  Treatment planning then occurred.  The radiation prescription was entered and confirmed.  Then, I designed and supervised the construction of a total of 5 medically necessary complex treatment devices including VacLoc body positioner and 4 MLCs to shield the bowel and femoral necks.  I have requested : 3D Simulation  I have requested a DVH of the following structures: small bowel, rectum, left femoral head, right femoral head and targets.  SPECIAL TREATMENT PROCEDURE:  The planned course of therapy using radiation constitutes a special treatment procedure. Special care is required in the management of this patient for the following reasons. This treatment constitutes a Special Treatment Procedure for the following reason: [ Concurrent  chemotherapy requiring careful monitoring for increased toxicities of treatment including weekly laboratory values..  The special nature of the planned course of radiotherapy will require increased physician supervision and oversight to ensure patient's safety with optimal treatment outcomes.  PLAN:  The patient will receive 50.4 Gy in 28 fractions of 1.8 Gy to the bladder fossa and pelvic lymph nodes.  ________________________________  Artist Pais Kathrynn Running, M.D.

## 2023-06-10 ENCOUNTER — Other Ambulatory Visit: Payer: Self-pay

## 2023-06-12 DIAGNOSIS — Z510 Encounter for antineoplastic radiation therapy: Secondary | ICD-10-CM | POA: Diagnosis not present

## 2023-06-12 DIAGNOSIS — C678 Malignant neoplasm of overlapping sites of bladder: Secondary | ICD-10-CM | POA: Diagnosis not present

## 2023-06-13 ENCOUNTER — Inpatient Hospital Stay: Payer: PPO

## 2023-06-13 ENCOUNTER — Inpatient Hospital Stay (HOSPITAL_BASED_OUTPATIENT_CLINIC_OR_DEPARTMENT_OTHER): Payer: PPO | Admitting: Hematology and Oncology

## 2023-06-13 ENCOUNTER — Telehealth: Payer: Self-pay

## 2023-06-13 ENCOUNTER — Other Ambulatory Visit: Payer: PPO

## 2023-06-13 ENCOUNTER — Other Ambulatory Visit: Payer: Self-pay | Admitting: Hematology and Oncology

## 2023-06-13 VITALS — BP 152/68 | HR 52 | Temp 98.2°F | Resp 7 | Ht 66.0 in | Wt 259.3 lb

## 2023-06-13 DIAGNOSIS — C679 Malignant neoplasm of bladder, unspecified: Secondary | ICD-10-CM | POA: Diagnosis not present

## 2023-06-13 DIAGNOSIS — R7989 Other specified abnormal findings of blood chemistry: Secondary | ICD-10-CM

## 2023-06-13 DIAGNOSIS — Z95828 Presence of other vascular implants and grafts: Secondary | ICD-10-CM | POA: Diagnosis not present

## 2023-06-13 DIAGNOSIS — C671 Malignant neoplasm of dome of bladder: Secondary | ICD-10-CM | POA: Diagnosis not present

## 2023-06-13 LAB — IRON AND IRON BINDING CAPACITY (CC-WL,HP ONLY)
Iron: 28 ug/dL — ABNORMAL LOW (ref 45–182)
Saturation Ratios: 13 % — ABNORMAL LOW (ref 17.9–39.5)
TIBC: 223 ug/dL — ABNORMAL LOW (ref 250–450)
UIBC: 195 ug/dL (ref 117–376)

## 2023-06-13 LAB — CMP (CANCER CENTER ONLY)
ALT: 36 U/L (ref 0–44)
AST: 28 U/L (ref 15–41)
Albumin: 3.5 g/dL (ref 3.5–5.0)
Alkaline Phosphatase: 114 U/L (ref 38–126)
Anion gap: 7 (ref 5–15)
BUN: 27 mg/dL — ABNORMAL HIGH (ref 8–23)
CO2: 26 mmol/L (ref 22–32)
Calcium: 8.7 mg/dL — ABNORMAL LOW (ref 8.9–10.3)
Chloride: 102 mmol/L (ref 98–111)
Creatinine: 2.07 mg/dL — ABNORMAL HIGH (ref 0.61–1.24)
GFR, Estimated: 34 mL/min — ABNORMAL LOW (ref >60)
Glucose, Bld: 124 mg/dL — ABNORMAL HIGH (ref 70–99)
Potassium: 4.3 mmol/L (ref 3.5–5.1)
Sodium: 135 mmol/L (ref 135–145)
Total Bilirubin: 0.4 mg/dL (ref 0.0–1.2)
Total Protein: 7.7 g/dL (ref 6.5–8.1)

## 2023-06-13 LAB — CBC WITH DIFFERENTIAL (CANCER CENTER ONLY)
Abs Immature Granulocytes: 0.06 10*3/uL (ref 0.00–0.07)
Basophils Absolute: 0 10*3/uL (ref 0.0–0.1)
Basophils Relative: 0 %
Eosinophils Absolute: 0.3 10*3/uL (ref 0.0–0.5)
Eosinophils Relative: 2 %
HCT: 24.6 % — ABNORMAL LOW (ref 39.0–52.0)
Hemoglobin: 7.9 g/dL — ABNORMAL LOW (ref 13.0–17.0)
Immature Granulocytes: 1 %
Lymphocytes Relative: 17 %
Lymphs Abs: 1.8 10*3/uL (ref 0.7–4.0)
MCH: 27.8 pg (ref 26.0–34.0)
MCHC: 32.1 g/dL (ref 30.0–36.0)
MCV: 86.6 fL (ref 80.0–100.0)
Monocytes Absolute: 0.8 10*3/uL (ref 0.1–1.0)
Monocytes Relative: 8 %
Neutro Abs: 7.7 10*3/uL (ref 1.7–7.7)
Neutrophils Relative %: 72 %
Platelet Count: 281 10*3/uL (ref 150–400)
RBC: 2.84 MIL/uL — ABNORMAL LOW (ref 4.22–5.81)
RDW: 16.6 % — ABNORMAL HIGH (ref 11.5–15.5)
WBC Count: 10.6 10*3/uL — ABNORMAL HIGH (ref 4.0–10.5)
nRBC: 0 % (ref 0.0–0.2)

## 2023-06-13 LAB — FOLATE: Folate: 38.1 ng/mL (ref >5.9)

## 2023-06-13 LAB — CULTURE, BLOOD (SINGLE)
MICRO NUMBER:: 16076396
MICRO NUMBER:: 16076399
Result:: NO GROWTH
SPECIMEN QUALITY:: ADEQUATE

## 2023-06-13 LAB — VITAMIN B12: Vitamin B-12: 213 pg/mL (ref 180–914)

## 2023-06-13 LAB — RETIC PANEL
Immature Retic Fract: 29.3 % — ABNORMAL HIGH (ref 2.3–15.9)
RBC.: 2.79 MIL/uL — ABNORMAL LOW (ref 4.22–5.81)
Retic Count, Absolute: 53.3 10*3/uL (ref 19.0–186.0)
Retic Ct Pct: 1.9 % (ref 0.4–3.1)
Reticulocyte Hemoglobin: 26.7 pg — ABNORMAL LOW (ref >27.9)

## 2023-06-13 LAB — FERRITIN: Ferritin: 530 ng/mL — ABNORMAL HIGH (ref 24–336)

## 2023-06-13 MED ORDER — SODIUM CHLORIDE 0.9% FLUSH
10.0000 mL | Freq: Once | INTRAVENOUS | Status: AC
  Administered 2023-06-13: 10 mL

## 2023-06-13 MED ORDER — HEPARIN SOD (PORK) LOCK FLUSH 100 UNIT/ML IV SOLN
500.0000 [IU] | Freq: Once | INTRAVENOUS | Status: AC
  Administered 2023-06-13: 500 [IU] via INTRAVENOUS

## 2023-06-13 MED ORDER — SODIUM CHLORIDE 0.9% FLUSH
10.0000 mL | INTRAVENOUS | Status: DC | PRN
Start: 2023-06-13 — End: 2023-06-13
  Administered 2023-06-13: 10 mL via INTRAVENOUS

## 2023-06-13 NOTE — Progress Notes (Signed)
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 [name]  08/12/2022: Cycle 1 of Pembrolizumab 09/02/2022: Cycle 2 of Pembrolizumab  09/23/2022: Cycle 3 of Pembrolizumab 10/14/2022: Cycle 4 of Pembrolizumab 11/04/2022: Cycle 5 of Pembrolizumab 11/25/2022: Cycle 6 of Pembrolizumab 12/20/2022: Cycle 7 of Pembrolizumab 01/13/2023: Cycle 8 of Pembrolizumab  03/03/2023: Cystectomy/Prostatectomy showed infiltrative high-grade urothelial carcinoma, size 5.8 cm involving bladder dome, anterior wall and prostatic stroma. Tumor invades directly into prostatic stroma at apex and mid portion of the gland (pT4a). Metastatic urothelial cancer in one of two left common iliac lymph nodes.  05/30/2023: Cycle 1 Day 1 of Gem/Cis deferred due to rising creatinine levels and anemia.   CHIEF COMPLAINTS/PURPOSE OF CONSULTATION:  "High Risk Non-Muscle Invasive Bladder Cancer "  HISTORY OF PRESENTING ILLNESS:  Luis [name] 68 y.o. 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 He presents today with  his wife to start cycle 1, day 1 of gemcitabine/cisplatin.   On exam today Mr. Hofland reports he has stopped his Jardiance and Lasix and has not noticed a difference.  He reports that overall he feels well.  He notes that he is not having any swelling in his lower extremities or discomfort in his abdomen.  He is also not having any shortness of breath.  He reports his appetite remains poor and he is quite sleepy and tired.  He reports that the smell of certain foods does turn his stomach.  He was having low-grade temperatures in the 99 range and recently met with infectious disease who performed blood cultures.  These blood cultures were negative.  He reports today his energy levels are "okay".  Overall he is agreeable to proceeding with radiation therapy first and then optimizing his kidney function and anemia prior to the start chemotherapy.  He denies fevers, chills, sweats, shortness of breath, chest pain, cough, headaches, dizziness or peripheral edema.He has no other complaints. A full 10 point ROS is otherwise negative.  MEDICAL HISTORY:  Past Medical History:  Diagnosis Date   Anxiety    takes Valium as needed   Aortic stenosis, moderate    Arthritis    Ascending aortic aneurysm (HCC)    CAD (coronary artery disease)    a. s/p PCI of the RCA 8/12 with DES by Dr Excell Seltzer, preserved EF. b. LHC/RHC (2/16) with mean RA 12, PA 32/15, mean PCWP 18, CI 3.47; patent mid and distal RCA stents, 50-60% proximal stenosis small PDA.      Cancer [facility])    bladder   Carotid stenosis    a. Carotid US (05/2013):  Bilateral 1-39% ICA; L thyroid nodule (prior hx of aspiration).   Chronic diastolic CHF (congestive heart failure) (HCC)  Complication of anesthesia    difficulty waking up after gallbladder surgery   Depression    Dyspnea    Essential hypertension    GERD (gastroesophageal reflux disease)    if needed will take OTC meds    Heart murmur    History of colonic polyps    hyperplastic    Hyperlipidemia    Joint pain    Lesion of bladder    Myocardial infarction (HCC) 2012   Obesity (BMI 30-39.9) 02/29/2016   Pre-diabetes    Restless leg    Sleep apnea    uses cpap   Tubular adenoma of colon    Vertigo    takes Meclizine as needed    SURGICAL HISTORY: Past Surgical History:  Procedure Laterality Date   AORTIC VALVE REPLACEMENT N/A 09/16/2021   Procedure: AORTIC VALVE REPLACEMENT (AVR);  Surgeon: Alleen Borne, MD;  Location: [facility] OR;  Service: Open Heart Surgery;  Laterality: N/A;   CATARACT EXTRACTION   4 YRS AGO   BOTH EYES   CHOLECYSTECTOMY  07/21/2011   Procedure: LAPAROSCOPIC CHOLECYSTECTOMY WITH INTRAOPERATIVE CHOLANGIOGRAM;  Surgeon: Kandis Cocking, MD;  Location: [facility] ORS;  Service: General;  Laterality: N/A;   CORONARY ANGIOPLASTY  2012   2 stents   coronary stenting     s/p PCI of the RCA by Dr Excell Seltzer 8/12 with 2 promus stents   CYSTOSCOPY W/ RETROGRADES Bilateral 12/13/2019   Procedure: CYSTOSCOPY WITH RETROGRADE PYELOGRAM;  Surgeon: Sebastian Ache, MD;  Location: [facility];  Service: Urology;  Laterality: Bilateral;   CYSTOSCOPY W/ RETROGRADES Bilateral 10/14/2020   Procedure: CYSTOSCOPY WITH RETROGRADE PYELOGRAM;  Surgeon: Sebastian Ache, MD;  Location: [facility];  Service: Urology;  Laterality: Bilateral;   CYSTOSCOPY W/ RETROGRADES Bilateral 12/16/2020   Procedure: CYSTOSCOPY WITH RETROGRADE PYELOGRAM;  Surgeon: Sebastian Ache, MD;  Location: [facility];  Service: Urology;  Laterality: Bilateral;   CYSTOSCOPY W/ RETROGRADES Bilateral 06/03/2022   Procedure: CYSTOSCOPY WITH RETROGRADE PYELOGRAM;  Surgeon: Sebastian Ache, MD;  Location: [facility];  Service: Urology;  Laterality: Bilateral;   CYSTOSCOPY W/ RETROGRADES Bilateral 12/28/2022   Procedure: CYSTOSCOPY WITH RETROGRADE PYELOGRAM, FULGARATION OF BLEEDERS;  Surgeon: Loletta Parish., MD;  Location: [facility];  Service: Urology;  Laterality: Bilateral;    CYSTOSCOPY WITH INJECTION N/A 03/03/2023   Procedure: CYSTOSCOPY WITH INDOCYANINE INJECTION;  Surgeon: Loletta Parish., MD;  Location: [facility];  Service: Urology;  Laterality: N/A;  360 MINUTES   IR IMAGING GUIDED PORT INSERTION  05/29/2023   LEFT AND RIGHT HEART CATHETERIZATION WITH CORONARY ANGIOGRAM N/A 06/23/2014   Procedure: LEFT AND RIGHT HEART CATHETERIZATION WITH CORONARY ANGIOGRAM;  Surgeon: Laurey Morale, MD;  Location: [facility] CATH LAB;  Service: Cardiovascular;  Laterality: N/A;   NECK SURGERY  03/23/09   per Dr. Ophelia Charter, cervical fusion    PERICARDIOCENTESIS N/A 09/28/2021   Procedure: PERICARDIOCENTESIS;  Surgeon: Corky Crafts, MD;  Location: [facility] INVASIVE CV LAB;  Service: Cardiovascular;  Laterality: N/A;   REPLACEMENT ASCENDING AORTA N/A 09/16/2021   Procedure: REPLACEMENT ASCENDING AORTA WITH 30 X HEMASHIELD PLATINUM WOVEN DOUBLE VELOUR VASCULAR GRAFT;  Surgeon: Alleen Borne, MD;  Location: [facility] OR;  Service: Open Heart Surgery;  Laterality: N/A;  CIRC ARREST   right elbow surgery     RIGHT HEART CATH AND CORONARY ANGIOGRAPHY N/A 07/08/2021   Procedure: RIGHT HEART CATH AND CORONARY ANGIOGRAPHY;  Surgeon: Laurey Morale, MD;  Location: [facility] INVASIVE CV LAB;  Service: Cardiovascular;  Laterality: N/A;   ROBOT ASSISTED LAPAROSCOPIC COMPLETE CYSTECT ILEAL CONDUIT N/A 03/03/2023   Procedure: XI ROBOTIC ASSISTED LAPAROSCOPIC COMPLETE CYSTECTECTOMY WITH  ILEAL CONDUIT DIVERSION;  Surgeon: Loletta Parish., MD;  Location: [facility];  Service: Urology;  Laterality: N/A;   ROBOT ASSISTED LAPAROSCOPIC RADICAL PROSTATECTOMY N/A 03/03/2023   Procedure: XI ROBOTIC ASSISTED LAPAROSCOPIC RADICAL PROSTATECTOMY WITH LYMPH NODE DISSECTION;  Surgeon: Loletta Parish., MD;  Location: [facility];  Service: Urology;  Laterality: N/A;   solonscopy  05/23/08   per Dr. Celene Kras hemorrhoids only, repeat in 5 years   SUBXYPHOID PERICARDIAL WINDOW N/A 09/28/2021   Procedure: SUBXYPHOID  PERICARDIAL WINDOW;  Surgeon: Alleen Borne, MD;  Location: [facility] OR;  Service: Thoracic;  Laterality: N/A;   TEE WITHOUT CARDIOVERSION N/A 06/23/2014   Procedure: TRANSESOPHAGEAL ECHOCARDIOGRAM (TEE);  Surgeon: Laurey Morale, MD;  Location: [facility] ENDOSCOPY;  Service: Cardiovascular;  Laterality: N/A;   TEE WITHOUT CARDIOVERSION N/A 01/21/2016   Procedure: TRANSESOPHAGEAL ECHOCARDIOGRAM (TEE);  Surgeon: Laurey Morale, MD;  Location: [facility] ENDOSCOPY;  Service: Cardiovascular;  Laterality: N/A;   TEE WITHOUT CARDIOVERSION N/A 09/16/2021   Procedure: TRANSESOPHAGEAL ECHOCARDIOGRAM (TEE);  Surgeon: Alleen Borne, MD;  Location: [facility] OR;  Service: Open Heart Surgery;  Laterality: N/A;   TEE WITHOUT CARDIOVERSION N/A 09/28/2021   Procedure: TRANSESOPHAGEAL ECHOCARDIOGRAM (TEE);  Surgeon: Alleen Borne, MD;  Location: [facility] OR;  Service: Thoracic;  Laterality: N/A;   TONSILLECTOMY     TRANSESOPHAGEAL ECHOCARDIOGRAM (CATH LAB) N/A 04/17/2023   Procedure: TRANSESOPHAGEAL ECHOCARDIOGRAM;  Surgeon: Jake Bathe, MD;  Location: [facility] INVASIVE CV LAB;  Service: Cardiovascular;  Laterality: N/A;   TRANSURETHRAL RESECTION OF BLADDER TUMOR N/A 10/21/2019   Procedure: TRANSURETHRAL RESECTION OF BLADDER TUMOR (TURBT);  Surgeon: Ihor Gully, MD;  Location: [facility];  Service: Urology;  Laterality: N/A;   TRANSURETHRAL RESECTION OF BLADDER TUMOR N/A 12/13/2019   Procedure: TRANSURETHRAL RESECTION OF BLADDER TUMOR (TURBT);  Surgeon: Sebastian Ache, MD;  Location: [facility];  Service: Urology;  Laterality: N/A;  1 HR   TRANSURETHRAL RESECTION OF BLADDER TUMOR N/A 10/14/2020   Procedure: TRANSURETHRAL RESECTION OF BLADDER TUMOR (TURBT);  Surgeon: Sebastian Ache, MD;  Location: [facility];  Service: Urology;  Laterality: N/A;   TRANSURETHRAL RESECTION OF BLADDER TUMOR N/A 12/16/2020   Procedure: RESTAGING TRANSURETHRAL RESECTION OF BLADDER TUMOR (TURBT);  Surgeon: Sebastian Ache,  MD;  Location: [facility];  Service: Urology;  Laterality: N/A;   TRANSURETHRAL RESECTION OF BLADDER TUMOR N/A 06/03/2022   Procedure: TRANSURETHRAL RESECTION OF BLADDER TUMOR (TURBT);  Surgeon: Sebastian Ache, MD;  Location: [facility];  Service: Urology;  Laterality: N/A;   UMBILICAL HERNIA REPAIR  03/03/2023   Procedure: HERNIA REPAIR UMBILICAL;  Surgeon: Loletta Parish., MD;  Location: [facility];  Service: Urology;;    SOCIAL HISTORY: Social History   Socioeconomic History   Marital status: Married    Spouse name: Not on file   Number of children: 4   Years of education: Not on file   Highest education level: Not on file  Occupational History   Occupation: Manufacturing engineer    Employer: [employer]  Tobacco Use   Smoking status: Former    Current packs/day: 0.00    Average packs/day: 2.0 packs/day for 40.0 years (80.0 ttl pk-yrs)    Types: Cigarettes    Start date: 29    Quit date: 2012  Years since quitting: 13.1    Passive exposure: Never   Smokeless tobacco: Never  Vaping Use   Vaping status: Never Used  Substance and Sexual Activity   Alcohol use: No    Alcohol/week: 0.0 standard drinks of alcohol   Drug use: No   Sexual activity: Not on file  Other Topics Concern   Not on file  Social History Narrative   Not on file   Social Drivers of Health   Financial Resource Strain: Low Risk  (11/03/2022)   Overall Financial Resource Strain (CARDIA)    Difficulty of Paying Living Expenses: Not hard at all  Food Insecurity: No Food Insecurity (05/09/2023)   Hunger Vital Sign    Worried About Running Out of Food in the Last Year: Never true    Ran Out of Food in the Last Year: Never true  Transportation Needs: No Transportation Needs (05/09/2023)   PRAPARE - Administrator, Civil Service (Medical): No    Lack of Transportation (Non-Medical): No  Physical Activity: Unknown (11/03/2022)   Exercise Vital Sign    Days of Exercise per  Week: Patient unable to answer    Minutes of Exercise per Session: 30 min  Stress: No Stress Concern Present (11/03/2022)   [employer] of Occupational Health - Occupational Stress Questionnaire    Feeling of Stress : Not at all  Social Connections: Moderately Isolated (11/03/2022)   Social Connection and Isolation Panel [NHANES]    Frequency of Communication with Friends and Family: More than three times a week    Frequency of Social Gatherings with Friends and Family: More than three times a week    Attends Religious Services: Never    Database administrator or Organizations: No    Attends Banker Meetings: Never    Marital Status: Married  Catering manager Violence: Not At Risk (05/09/2023)   Humiliation, Afraid, Rape, and Kick questionnaire    Fear of Current or Ex-Partner: No    Emotionally Abused: No    Physically Abused: No    Sexually Abused: No    FAMILY HISTORY: Family History  Problem Relation Age of Onset   Lung cancer Mother        lung   Esophageal cancer Cousin    Colon cancer Neg Hx    Rectal cancer Neg Hx    Stomach cancer Neg Hx     ALLERGIES:  has no known allergies.  MEDICATIONS:  Current Outpatient Medications  Medication Sig Dispense Refill   acetaminophen (TYLENOL) 325 MG tablet Take 650 mg by mouth every 6 (six) hours as needed (for pain).     amLODipine (NORVASC) 5 MG tablet Take 1 tablet (5 mg total) by mouth daily. 90 tablet 1   aspirin EC 81 MG tablet Take 81 mg by mouth in the morning.     carvedilol (COREG) 3.125 MG tablet TAKE 1 TABLET BY MOUTH TWICE A DAY WITH FOOD 180 tablet 1   diazepam (VALIUM) 5 MG tablet TAKE 1 TABLET BY MOUTH EVERY 12 HOURS AS NEEDED FOR ANXIETY. (Patient taking differently: Take 5 mg by mouth every 12 (twelve) hours as needed for anxiety.) 60 tablet 5   ferrous sulfate 325 (65 FE) MG tablet Take 325 mg by mouth daily with breakfast.     furosemide (LASIX) 20 MG tablet Take 2 tablets (40 mg total) by  mouth as needed. 45 tablet 3   ipratropium (ATROVENT) 0.03 % nasal spray Place 2 sprays into both  nostrils every 12 (twelve) hours as needed for rhinitis.     JARDIANCE 10 MG TABS tablet Take 1 tablet (10 mg total) by mouth daily. (Patient not taking: Reported on 06/07/2023) 30 tablet 11   lidocaine-prilocaine (EMLA) cream Apply 1 Application topically as needed. 30 g 0   Magnesium 500 MG TABS Take 500 mg by mouth in the morning.     meclizine (ANTIVERT) 25 MG tablet Take 1 tablet (25 mg total) by mouth 3 (three) times daily as needed for dizziness. 30 tablet 0   multivitamin-iron-minerals-folic acid (CENTRUM) chewable tablet Chew 1 tablet by mouth daily.     nitroGLYCERIN (NITROSTAT) 0.4 MG SL tablet Place 0.4 mg under the tongue every 5 (five) minutes as needed for chest pain.     ondansetron (ZOFRAN) 8 MG tablet Take 1 tablet (8 mg total) by mouth every 8 (eight) hours as needed. 30 tablet 0   potassium chloride SA (KLOR-CON M20) 20 MEQ tablet Take 1 tablet (20 mEq total) by mouth daily.     prochlorperazine (COMPAZINE) 10 MG tablet Take 1 tablet (10 mg total) by mouth every 6 (six) hours as needed for nausea or vomiting. 30 tablet 0   rosuvastatin (CRESTOR) 20 MG tablet TAKE 1 TABLET BY MOUTH EVERYDAY AT BEDTIME (Patient taking differently: Take 20 mg by mouth at bedtime.) 90 tablet 3   venlafaxine XR (EFFEXOR-XR) 150 MG 24 hr capsule TAKE 1 CAPSULE BY MOUTH DAILY WITH BREAKFAST. 90 capsule 0   Current Facility-Administered Medications  Medication Dose Route Frequency Provider Last Rate Last Admin   sodium chloride flush (NS) 0.9 % injection 10 mL  10 mL Intravenous PRN Ulysees Barns IV, MD   10 mL at 06/13/23 1222    REVIEW OF SYSTEMS:   Constitutional: ( - ) fevers, ( - )  chills , ( - ) night sweats Eyes: ( - ) blurriness of vision, ( - ) double vision, ( - ) watery eyes Ears, nose, mouth, throat, and face: ( - ) mucositis, ( - ) sore throat Respiratory: ( - ) cough, ( - ) dyspnea, ( -  ) wheezes Cardiovascular: ( - ) palpitation, ( - ) chest discomfort, ( - ) lower extremity swelling Gastrointestinal:  ( - ) nausea, ( - ) heartburn, ( - ) change in bowel habits Skin: ( - ) abnormal skin rashes Lymphatics: ( - ) new lymphadenopathy, ( - ) easy bruising Neurological: ( - ) numbness, ( - ) tingling, ( - ) new weaknesses Behavioral/Psych: ( - ) mood change, ( - ) new changes  All other systems were reviewed with the patient and are negative.  PHYSICAL EXAMINATION: ECOG PERFORMANCE STATUS: 1 - Symptomatic but completely ambulatory  Vitals:   06/13/23 1200  BP: (!) 152/68  Pulse: (!) 52  Resp: (!) 7  Temp: 98.2 F (36.8 C)  SpO2: 100%    Filed Weights   06/13/23 1200  Weight: 259 lb 5 oz (117.6 kg)     GENERAL: well appearing elderly Caucasian male in NAD  SKIN: skin color, texture, turgor are normal, no rashes or significant lesions EYES: conjunctiva are pink and non-injected, sclera clear LUNGS: clear to auscultation and percussion with normal breathing effort HEART: regular rate & rhythm and no murmurs and no lower extremity edema Musculoskeletal: no cyanosis of digits and no clubbing  PSYCH: alert & oriented x 3, fluent speech NEURO: no focal motor/sensory deficits  LABORATORY DATA:  I have reviewed the data as listed  Latest Ref Rng & Units 06/13/2023   10:19 AM 06/06/2023    8:10 AM 05/30/2023    7:26 AM  CBC  WBC 4.0 - 10.5 K/uL 10.6  10.2  7.8   Hemoglobin 13.0 - 17.0 g/dL 7.9  8.2  8.5   Hematocrit 39.0 - 52.0 % 24.6  25.0  26.6   Platelets 150 - 400 K/uL 281  227  211        Latest Ref Rng & Units 06/13/2023   10:19 AM 06/06/2023    8:10 AM 06/02/2023   10:02 AM  CMP  Glucose 70 - 99 mg/dL 621  308  657   BUN 8 - 23 mg/dL 27  27  26    Creatinine 0.61 - 1.24 mg/dL 8.46  9.62  9.52   Sodium 135 - 145 mmol/L 135  135  138   Potassium 3.5 - 5.1 mmol/L 4.3  3.9  4.8   Chloride 98 - 111 mmol/L 102  103  107   CO2 22 - 32 mmol/L 26  24  23     Calcium 8.9 - 10.3 mg/dL 8.7  8.4  8.7   Total Protein 6.5 - 8.1 g/dL 7.7  7.7    Total Bilirubin 0.0 - 1.2 mg/dL 0.4  0.5    Alkaline Phos 38 - 126 U/L 114  94    AST 15 - 41 U/L 28  17    ALT 0 - 44 U/L 36  17      RADIOGRAPHIC STUDIES: US RENAL Result Date: 05/31/2023 CLINICAL DATA:  Renal insufficiency with elevated creatinine levels. Previous cystectomy and ileal conduit. Previous right ureteral stent. EXAM: RENAL / URINARY TRACT ULTRASOUND COMPLETE COMPARISON:  Right upper quadrant abdominal ultrasound dated 06/04/2011. Abdomen and pelvis CT dated 04/13/2023. FINDINGS: Right Kidney: Renal measurements: 12.7 x 5.7 x 5.5 cm = volume: 206 mL. Normal echotexture. Mild-to-moderate dilatation of the renal collecting system, similar to the previous CT. Left Kidney: Renal measurements: 13.0 x 5.7 x 5.7 cm = volume: 194 mL. Normal echotexture. Mild-to-moderate dilatation of the renal collecting system, similar to the previous CT. Exophytic simple cysts, unchanged. These do not need imaging follow-up. Bladder: Surgically absent. Other: Diffusely echogenic liver. Corresponding mild low density on the previous CT. IMPRESSION: 1. Mild-to-moderate bilateral hydronephrosis, similar to the previous CT. 2. Diffuse hepatic steatosis. Electronically Signed   By: Beckie Salts M.D.   On: 05/31/2023 15:05   IR IMAGING GUIDED PORT INSERTION Result Date: 05/29/2023 CLINICAL DATA:  Bladder carcinoma, needs durable venous access for planned treatment regimen. EXAM: TUNNELED PORT CATHETER PLACEMENT WITH ULTRASOUND AND FLUOROSCOPIC GUIDANCE FLUOROSCOPY: Radiation Exposure Index (as provided by the fluoroscopic device): 27 mGy air Kerma ANESTHESIA/SEDATION: Intravenous Fentanyl and Versed 3mg  were administered by RN during a total moderate (conscious) sedation time of 19 minutes; the patient's level of consciousness and physiological / cardiorespiratory status were monitored continuously by radiology RN under my direct  supervision. TECHNIQUE: The procedure, risks, benefits, and alternatives were explained to the patient. Questions regarding the procedure were encouraged and answered. The patient understands and consents to the procedure. Patency of the right IJ vein was confirmed with ultrasound with image documentation. An appropriate skin site was determined. Skin site was marked. Region was prepped using maximum barrier technique including cap and mask, sterile gown, sterile gloves, large sterile sheet, and Chlorhexidine as cutaneous antisepsis. The region was infiltrated locally with 1% lidocaine. Under real-time ultrasound guidance, the right IJ vein was accessed with a 21  gauge micropuncture needle; the needle tip within the vein was confirmed with ultrasound image documentation. Needle was exchanged over a 018 guidewire for transitional dilator, and vascular measurement was performed. A small incision was made on the right anterior chest wall and a subcutaneous pocket fashioned. The power-injectable port was positioned and its catheter tunneled to the right IJ dermatotomy site. The transitional dilator was exchanged over an Amplatz wire for a peel-away sheath, through which the port catheter, which had been trimmed to the appropriate length, was advanced and positioned under fluoroscopy with its tip at the cavoatrial junction. Spot chest radiograph confirms good catheter position and no pneumothorax. The port was flushed per protocol. The pocket was closed with deep interrupted and subcuticular continuous 3-0 Monocryl sutures. The incisions were covered with Dermabond then covered with a sterile dressing. The patient tolerated the procedure well. COMPLICATIONS: COMPLICATIONS None immediate IMPRESSION: Technically successful right IJ power-injectable port catheter placement. Ready for routine use. Electronically Signed   By: Corlis Leak M.D.   On: 05/29/2023 11:39   CT Chest W Contrast Result Date: 05/18/2023 CLINICAL  DATA:  Bladder cancer, staging.  * Tracking Code: BO * EXAM: CT CHEST WITH CONTRAST TECHNIQUE: Multidetector CT imaging of the chest was performed during intravenous contrast administration. RADIATION DOSE REDUCTION: This exam was performed according to the departmental dose-optimization program which includes automated exposure control, adjustment of the mA and/or kV according to patient size and/or use of iterative reconstruction technique. CONTRAST:  75mL OMNIPAQUE IOHEXOL 300 MG/ML  SOLN COMPARISON:  Chest CT 04/18/2023 and 12/14/2022. FINDINGS: Cardiovascular: Stable postsurgical changes status post median sternotomy, aortic valve replacement and aortic root grafting. There is atherosclerosis of the aorta, great vessels and coronary arteries. No acute vascular findings are identified. The heart size is normal. There is no pericardial effusion. Mediastinum/Nodes: There are no enlarged mediastinal, hilar or axillary lymph nodes. The thyroid gland, trachea and esophagus demonstrate no significant findings. Lungs/Pleura: No pleural effusion or pneumothorax. The lungs are clear. No confluent airspace disease or suspicious pulmonary nodularity. Upper abdomen: Hepatic steatosis and prior cholecystectomy. No acute or suspicious findings identified in the visualized upper abdomen. Musculoskeletal/Chest wall: There is no chest wall mass or suspicious osseous finding. Mild symmetric bilateral gynecomastia. Previous lower cervical fusion and median sternotomy. Multilevel spondylosis. IMPRESSION: 1. No evidence of metastatic disease in the chest. 2. Stable postsurgical changes status post median sternotomy, aortic valve replacement and aortic root grafting. 3. Hepatic steatosis. 4. Coronary and aortic Atherosclerosis (ICD10-I70.0). Electronically Signed   By: Carey Bullocks M.D.   On: 05/18/2023 12:00    ASSESSMENT & PLAN OMARIO ANDER 68 y.o. male with medical history significant for BCG unresponsive high risk  nonmuscle invasive bladder cancer who presents to establish care.  After review of the labs, review of the records, and discussion with the patient the patients findings are most consistent with BCG unresponsive high risk non-muscle invasive bladder cancer, not a candidate for cystectomy.  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 21 days until progression or intolerance up to 24 months. --Received 8 cycles of Pembrolizumab on 08/12/2022-01/13/2023: Cycle 8 of Pembrolizumab  --On 03/03/2023, underwent Cystectomy/Prostatectomy showed infiltrative high-grade urothelial carcinoma, size 5.8 cm involving bladder dome, anterior wall and prostatic stroma. Tumor invades directly into prostatic stroma at apex and mid portion of the gland (pT4a). Metastatic urothelial cancer in one of two left common iliac lymph  nodes.  -- Given the results of the pathology showing positive margins and spread to the lymph node we are considering radiation therapy versus chemotherapy.  For chemotherapy, recommend gemcitabine and cisplatin x 4 cycles.  This would consist of gemcitabine 1000 mg/m on days 1 and 8 and cisplatin 70 mg/m on day 1 of 21-day cycle for 4 cycles. PLAN:  --HOLD Gemcitabine plus Cisplatin due to anemia/Kidney dysfunction -- Radiation oncology has met with patient and was agreeable to starting radiation therapy prior to the start of chemotherapy.  He is scheduled to start radiation therapy next week. --Labs from today were reviewed with patient. WBC 10.6, hemoglobin 7.9, MCV 86.6, platelets 281, LFTs in range.  --Due to rising creatinine levels and anemia, recommended to defer start of treatment last week.  --RTC in 4 weeks for labs and 8 weeks for clinic visit   # Anemia -- Nutritional panel today to include iron panel, ferritin, vitamin B12, and folate --May be multifactorial due to  nutritional deficiency as well as kidney dysfunction.  # AKI -- Creatinine levels 2.07 today (it was 1.36 on 04/20/2023). Improved from 2.37 at last visit.  -- Discussed lasix dosage with cardiology NP who recommended to decrease lasix to 40 mg PO daily PRN since he is not complaining of SOB or edema. Recommendations from patient's nephrologist are pending.  --  US renal did not show any obstructive etiologies.   # Abdominal Rash--improved -- Small circular rash, approximately 3 inches in diameter. -- Patient has access to hydrocortisone 5% at home, recommend he apply that to the lesion 1-2 times daily to see if there is improvement.  #Supportive Care -- chemotherapy education complete -- port placed -- zofran 8mg  q8H PRN and compazine 10mg  PO q6H for nausea -- EMLA cream for port -- no pain medication required at this time.    No orders of the defined types were placed in this encounter.  All questions were answered. The patient knows to call the clinic with any problems, questions or concerns.  I have spent a total of 30 minutes minutes of face-to-face and non-face-to-face time, preparing to see the patient, performing a medically appropriate examination, counseling and educating the patient, documenting clinical information in the electronic health record,and care coordination.   Ulysees Barns, MD Department of Hematology/Oncology [facility] at [facility] Phone: [phone] Pager: [phone] Email: [email or web address]

## 2023-06-13 NOTE — Telephone Encounter (Signed)
-----   Message from [place] sent at 06/13/2023  8:41 AM EST ----- Regarding: blood cultures no growth at 5 days and final  ----- Message ----- From: Janace Hoard Lab Results In Sent: 06/08/2023  10:09 AM EST To: Randall Hiss, MD

## 2023-06-14 MED ORDER — HEPARIN SODIUM (PORCINE) 1000 UNIT/ML IJ SOLN
INTRAMUSCULAR | Status: AC
  Filled 2023-06-14: qty 10

## 2023-06-14 MED ORDER — LIDOCAINE HCL (PF) 1 % IJ SOLN
INTRAMUSCULAR | Status: AC
  Filled 2023-06-14: qty 30

## 2023-06-14 MED ORDER — VERAPAMIL HCL 2.5 MG/ML IV SOLN
INTRAVENOUS | Status: AC
  Filled 2023-06-14: qty 2

## 2023-06-15 ENCOUNTER — Ambulatory Visit (HOSPITAL_COMMUNITY)
Admission: RE | Admit: 2023-06-15 | Discharge: 2023-06-15 | Disposition: A | Discharge status: Home / Self Care | Payer: PPO | Attending: Cardiology | Admitting: Cardiology

## 2023-06-15 ENCOUNTER — Encounter (HOSPITAL_COMMUNITY)
Admission: RE | Disposition: A | Discharge status: Home / Self Care | Payer: Self-pay | Source: Home / Self Care | Attending: Cardiology

## 2023-06-15 ENCOUNTER — Encounter (HOSPITAL_COMMUNITY): Payer: Self-pay | Admitting: Cardiology

## 2023-06-15 ENCOUNTER — Telehealth: Payer: Self-pay | Admitting: *Deleted

## 2023-06-15 ENCOUNTER — Other Ambulatory Visit: Payer: Self-pay

## 2023-06-15 DIAGNOSIS — C679 Malignant neoplasm of bladder, unspecified: Secondary | ICD-10-CM | POA: Diagnosis not present

## 2023-06-15 DIAGNOSIS — I110 Hypertensive heart disease with heart failure: Secondary | ICD-10-CM | POA: Diagnosis not present

## 2023-06-15 DIAGNOSIS — I5032 Chronic diastolic (congestive) heart failure: Secondary | ICD-10-CM | POA: Insufficient documentation

## 2023-06-15 DIAGNOSIS — Z7982 Long term (current) use of aspirin: Secondary | ICD-10-CM | POA: Diagnosis not present

## 2023-06-15 DIAGNOSIS — G4733 Obstructive sleep apnea (adult) (pediatric): Secondary | ICD-10-CM | POA: Diagnosis not present

## 2023-06-15 DIAGNOSIS — I25119 Atherosclerotic heart disease of native coronary artery with unspecified angina pectoris: Secondary | ICD-10-CM | POA: Diagnosis not present

## 2023-06-15 DIAGNOSIS — I7121 Aneurysm of the ascending aorta, without rupture: Secondary | ICD-10-CM | POA: Insufficient documentation

## 2023-06-15 DIAGNOSIS — Z79899 Other long term (current) drug therapy: Secondary | ICD-10-CM | POA: Insufficient documentation

## 2023-06-15 DIAGNOSIS — I480 Paroxysmal atrial fibrillation: Secondary | ICD-10-CM | POA: Insufficient documentation

## 2023-06-15 DIAGNOSIS — Z953 Presence of xenogenic heart valve: Secondary | ICD-10-CM | POA: Insufficient documentation

## 2023-06-15 DIAGNOSIS — I350 Nonrheumatic aortic (valve) stenosis: Secondary | ICD-10-CM | POA: Insufficient documentation

## 2023-06-15 DIAGNOSIS — E785 Hyperlipidemia, unspecified: Secondary | ICD-10-CM | POA: Diagnosis not present

## 2023-06-15 DIAGNOSIS — I509 Heart failure, unspecified: Secondary | ICD-10-CM

## 2023-06-15 DIAGNOSIS — Q2381 Bicuspid aortic valve: Secondary | ICD-10-CM | POA: Diagnosis not present

## 2023-06-15 HISTORY — PX: RIGHT HEART CATH: CATH118263

## 2023-06-15 LAB — POCT I-STAT EG7
Acid-base deficit: 1 mmol/L (ref 0.0–2.0)
Acid-base deficit: 1 mmol/L (ref 0.0–2.0)
Bicarbonate: 24.2 mmol/L (ref 20.0–28.0)
Bicarbonate: 24.6 mmol/L (ref 20.0–28.0)
Calcium, Ion: 1.08 mmol/L — ABNORMAL LOW (ref 1.15–1.40)
Calcium, Ion: 1.14 mmol/L — ABNORMAL LOW (ref 1.15–1.40)
HCT: 22 % — ABNORMAL LOW (ref 39.0–52.0)
HCT: 23 % — ABNORMAL LOW (ref 39.0–52.0)
Hemoglobin: 7.5 g/dL — ABNORMAL LOW (ref 13.0–17.0)
Hemoglobin: 7.8 g/dL — ABNORMAL LOW (ref 13.0–17.0)
O2 Saturation: 57 %
O2 Saturation: 61 %
Potassium: 4.2 mmol/L (ref 3.5–5.1)
Potassium: 4.4 mmol/L (ref 3.5–5.1)
Sodium: 138 mmol/L (ref 135–145)
Sodium: 140 mmol/L (ref 135–145)
TCO2: 25 mmol/L (ref 22–32)
TCO2: 26 mmol/L (ref 22–32)
pCO2, Ven: 41.2 mm[Hg] — ABNORMAL LOW (ref 44–60)
pCO2, Ven: 42.4 mm[Hg] — ABNORMAL LOW (ref 44–60)
pH, Ven: 7.372 (ref 7.25–7.43)
pH, Ven: 7.377 (ref 7.25–7.43)
pO2, Ven: 31 mm[Hg] — CL (ref 32–45)
pO2, Ven: 32 mm[Hg] (ref 32–45)

## 2023-06-15 LAB — GLUCOSE, CAPILLARY
Glucose-Capillary: 116 mg/dL — ABNORMAL HIGH (ref 70–99)
Glucose-Capillary: 94 mg/dL (ref 70–99)

## 2023-06-15 LAB — CBC
HCT: 23.7 % — ABNORMAL LOW (ref 39.0–52.0)
Hemoglobin: 7.7 g/dL — ABNORMAL LOW (ref 13.0–17.0)
MCH: 28 pg (ref 26.0–34.0)
MCHC: 32.5 g/dL (ref 30.0–36.0)
MCV: 86.2 fL (ref 80.0–100.0)
Platelets: 274 10*3/uL (ref 150–400)
RBC: 2.75 MIL/uL — ABNORMAL LOW (ref 4.22–5.81)
RDW: 16.4 % — ABNORMAL HIGH (ref 11.5–15.5)
WBC: 9 10*3/uL (ref 4.0–10.5)
nRBC: 0 % (ref 0.0–0.2)

## 2023-06-15 SURGERY — RIGHT HEART CATH
Anesthesia: LOCAL

## 2023-06-15 MED ORDER — ACETAMINOPHEN 325 MG PO TABS: 650.0000 mg | ORAL_TABLET | ORAL | Status: DC | PRN

## 2023-06-15 MED ORDER — FUROSEMIDE 20 MG PO TABS: 20.0000 mg | ORAL_TABLET | Freq: Every day | ORAL | 3 refills | Status: DC

## 2023-06-15 MED ORDER — VITAMIN B-12 1000 MCG PO TABS
1000.0000 ug | ORAL_TABLET | Freq: Every day | ORAL | 6 refills | Status: AC
Start: 2023-06-15 — End: ?

## 2023-06-15 MED ORDER — HEPARIN (PORCINE) IN NACL 1000-0.9 UT/500ML-% IV SOLN
INTRAVENOUS | Status: DC | PRN
  Administered 2023-06-15: 500 mL

## 2023-06-15 MED ORDER — LIDOCAINE HCL (PF) 1 % IJ SOLN
INTRAMUSCULAR | Status: DC | PRN
  Administered 2023-06-15: 2 mL

## 2023-06-15 MED ORDER — SODIUM CHLORIDE 0.9% FLUSH: 3.0000 mL | INTRAVENOUS | Status: DC | PRN

## 2023-06-15 MED ORDER — SODIUM CHLORIDE 0.9 % IV SOLN: 250.0000 mL | INTRAVENOUS | Status: DC | PRN

## 2023-06-15 MED ORDER — LIDOCAINE HCL (PF) 1 % IJ SOLN
INTRAMUSCULAR | Status: AC
  Filled 2023-06-15: qty 30

## 2023-06-15 MED ORDER — ONDANSETRON HCL 4 MG/2ML IJ SOLN: 4.0000 mg | Freq: Four times a day (QID) | INTRAMUSCULAR | Status: DC | PRN

## 2023-06-15 MED ORDER — SODIUM CHLORIDE 0.9% FLUSH: 3.0000 mL | Freq: Two times a day (BID) | INTRAVENOUS | Status: DC

## 2023-06-15 MED ORDER — SODIUM CHLORIDE 0.9 % IV SOLN
INTRAVENOUS | Status: DC
Start: 2023-06-15 — End: 2023-06-15

## 2023-06-15 MED ORDER — HYDRALAZINE HCL 20 MG/ML IJ SOLN: 10.0000 mg | INTRAMUSCULAR | Status: DC | PRN

## 2023-06-15 MED ORDER — LABETALOL HCL 5 MG/ML IV SOLN: 10.0000 mg | INTRAVENOUS | Status: DC | PRN

## 2023-06-15 SURGICAL SUPPLY — 9 items
CATH BALLN WEDGE 5F 110CM (CATHETERS) IMPLANT
GLIDESHEATH SLEND SS 6F .021 (SHEATH) IMPLANT
KIT SYRINGE INJ CVI SPIKEX1 (MISCELLANEOUS) IMPLANT
PACK CARDIAC CATHETERIZATION (CUSTOM PROCEDURE TRAY) ×1 IMPLANT
SET ATX-X65L (MISCELLANEOUS) IMPLANT
SHEATH GLIDE SLENDER 4/5FR (SHEATH) IMPLANT
SHEATH PROBE COVER 6X72 (BAG) IMPLANT
TRANSDUCER W/STOPCOCK (MISCELLANEOUS) IMPLANT
TUBING ART PRESS 72 MALE/FEM (TUBING) IMPLANT

## 2023-06-15 NOTE — Telephone Encounter (Signed)
-----   Message from Luis [name] sent at 06/15/2023 10:10 AM EST ----- Please let Mr. Dobler know that his iron levels look good, however he does appear to have a borderline vitamin B12.  Please call in vitamin B12 1000 mcg p.o. daily to a pharmacy of his choosing.  Please schedule him repeat labs in 2 weeks time. ----- Message ----- From: Leory Plowman, Lab In [place] Sent: 06/13/2023  10:40 AM EST To: Jaci Standard, MD

## 2023-06-15 NOTE — Telephone Encounter (Signed)
TCT patient regarding his recent lab results. Spoke to him. Advised that his iron levels look good, however he does appear to have a borderline vitamin B12. Please call in vitamin B12 1000 mcg p.o. daily to a pharmacy of his choosing. Advised we will repeat labs in 2 weeks. Pt voiced understanding and is agreeable to taking the B12. This was sent in to his pharmacy.

## 2023-06-15 NOTE — Discharge Instructions (Addendum)
Start back on Lasix (furosemide) 20 mg daily.   Brachial Site Care   This sheet gives you information about how to care for yourself after your procedure. Your health care provider may also give you more specific instructions. If you have problems or questions, contact your health care provider. What can I expect after the procedure? After the procedure, it is common to have: Bruising and tenderness at the catheter insertion area. Follow these instructions at home:  Insertion site care Follow instructions from your health care provider about how to take care of your insertion site. Make sure you: Wash your hands with soap and water before you change your bandage (dressing). If soap and water are not available, use hand sanitizer. Remove your dressing as told by your health care provider. In 24 hours Check your insertion site every day for signs of infection. Check for: Redness, swelling, or pain. Pus or a bad smell. Warmth. You may shower 24-48 hours after the procedure. Do not apply powder or lotion to the site.  Activity For 24 hours after the procedure, or as directed by your health care provider: Do not push or pull heavy objects with the affected arm. Do not drive yourself home from the hospital or clinic. You may drive 24 hours after the procedure unless your health care provider tells you not to. Do not lift anything that is heavier than 10 lb (4.5 kg), or the limit that you are told, until your health care provider says that it is safe.  For 24 hours

## 2023-06-15 NOTE — Interval H&P Note (Signed)
History and Physical Interval Note:  06/15/2023 9:05 AM  Luis [name]  has presented today for surgery, with the diagnosis of heart failure.  The various methods of treatment have been discussed with the patient and family. After consideration of risks, benefits and other options for treatment, the patient has consented to  Procedure(s): RIGHT HEART CATH (N/A) as a surgical intervention.  The patient's history has been reviewed, patient examined, no change in status, stable for surgery.  I have reviewed the patient's chart and labs.  Questions were answered to the patient's satisfaction.     Minnette Merida [employer]

## 2023-06-16 ENCOUNTER — Other Ambulatory Visit: Payer: Self-pay

## 2023-06-19 ENCOUNTER — Ambulatory Visit
Admission: RE | Admit: 2023-06-19 | Discharge: 2023-06-19 | Disposition: A | Discharge status: Home / Self Care | Payer: PPO | Source: Ambulatory Visit | Attending: Radiation Oncology | Admitting: Radiation Oncology

## 2023-06-19 ENCOUNTER — Other Ambulatory Visit: Payer: Self-pay

## 2023-06-19 ENCOUNTER — Telehealth (HOSPITAL_COMMUNITY): Payer: Self-pay | Admitting: Cardiology

## 2023-06-19 DIAGNOSIS — C678 Malignant neoplasm of overlapping sites of bladder: Secondary | ICD-10-CM | POA: Diagnosis not present

## 2023-06-19 DIAGNOSIS — Z510 Encounter for antineoplastic radiation therapy: Secondary | ICD-10-CM | POA: Diagnosis not present

## 2023-06-19 LAB — RAD ONC ARIA SESSION SUMMARY
Course Elapsed Days: 0
Plan Fractions Treated to Date: 1
Plan Prescribed Dose Per Fraction: 1.8 Gy
Plan Total Fractions Prescribed: 28
Plan Total Prescribed Dose: 50.4 Gy
Reference Point Dosage Given to Date: 1.8 Gy
Reference Point Session Dosage Given: 1.8 Gy
Session Number: 1

## 2023-06-19 NOTE — Telephone Encounter (Signed)
 Front office called patient at [phone] to remind patient of his appointment with Dr. Shirlee Latch on 06/20/23 at 9:40 AM.   Front office spoke to patient and confirmed appt with patient over the telephone.

## 2023-06-20 ENCOUNTER — Ambulatory Visit
Admission: RE | Admit: 2023-06-20 | Discharge: 2023-06-20 | Disposition: A | Discharge status: Home / Self Care | Payer: PPO | Source: Ambulatory Visit | Attending: Radiation Oncology | Admitting: Radiation Oncology

## 2023-06-20 ENCOUNTER — Ambulatory Visit: Payer: PPO | Admitting: Physician Assistant

## 2023-06-20 ENCOUNTER — Encounter (HOSPITAL_COMMUNITY): Payer: Self-pay | Admitting: Cardiology

## 2023-06-20 ENCOUNTER — Ambulatory Visit (HOSPITAL_BASED_OUTPATIENT_CLINIC_OR_DEPARTMENT_OTHER)
Admission: RE | Admit: 2023-06-20 | Discharge: 2023-06-20 | Disposition: A | Discharge status: Home / Self Care | Payer: PPO | Source: Ambulatory Visit | Attending: Cardiology | Admitting: Cardiology

## 2023-06-20 ENCOUNTER — Ambulatory Visit: Payer: PPO

## 2023-06-20 ENCOUNTER — Other Ambulatory Visit: Payer: PPO

## 2023-06-20 ENCOUNTER — Other Ambulatory Visit: Payer: Self-pay

## 2023-06-20 VITALS — BP 112/70 | HR 55 | Wt 257.8 lb

## 2023-06-20 DIAGNOSIS — Z9861 Coronary angioplasty status: Secondary | ICD-10-CM | POA: Insufficient documentation

## 2023-06-20 DIAGNOSIS — C679 Malignant neoplasm of bladder, unspecified: Secondary | ICD-10-CM | POA: Insufficient documentation

## 2023-06-20 DIAGNOSIS — Z952 Presence of prosthetic heart valve: Secondary | ICD-10-CM | POA: Insufficient documentation

## 2023-06-20 DIAGNOSIS — I110 Hypertensive heart disease with heart failure: Secondary | ICD-10-CM | POA: Insufficient documentation

## 2023-06-20 DIAGNOSIS — R5382 Chronic fatigue, unspecified: Secondary | ICD-10-CM

## 2023-06-20 DIAGNOSIS — I480 Paroxysmal atrial fibrillation: Secondary | ICD-10-CM | POA: Insufficient documentation

## 2023-06-20 DIAGNOSIS — G4733 Obstructive sleep apnea (adult) (pediatric): Secondary | ICD-10-CM | POA: Insufficient documentation

## 2023-06-20 DIAGNOSIS — D509 Iron deficiency anemia, unspecified: Secondary | ICD-10-CM | POA: Insufficient documentation

## 2023-06-20 DIAGNOSIS — C678 Malignant neoplasm of overlapping sites of bladder: Secondary | ICD-10-CM | POA: Diagnosis not present

## 2023-06-20 DIAGNOSIS — I5032 Chronic diastolic (congestive) heart failure: Secondary | ICD-10-CM | POA: Insufficient documentation

## 2023-06-20 DIAGNOSIS — E785 Hyperlipidemia, unspecified: Secondary | ICD-10-CM | POA: Insufficient documentation

## 2023-06-20 DIAGNOSIS — I2510 Atherosclerotic heart disease of native coronary artery without angina pectoris: Secondary | ICD-10-CM | POA: Insufficient documentation

## 2023-06-20 DIAGNOSIS — Z510 Encounter for antineoplastic radiation therapy: Secondary | ICD-10-CM | POA: Diagnosis not present

## 2023-06-20 DIAGNOSIS — C779 Secondary and unspecified malignant neoplasm of lymph node, unspecified: Secondary | ICD-10-CM | POA: Insufficient documentation

## 2023-06-20 LAB — BASIC METABOLIC PANEL
Anion gap: 5 (ref 5–15)
BUN: 22 mg/dL (ref 8–23)
CO2: 22 mmol/L (ref 22–32)
Calcium: 8 mg/dL — ABNORMAL LOW (ref 8.9–10.3)
Chloride: 106 mmol/L (ref 98–111)
Creatinine, Ser: 2.05 mg/dL — ABNORMAL HIGH (ref 0.61–1.24)
GFR, Estimated: 34 mL/min — ABNORMAL LOW (ref >60)
Glucose, Bld: 130 mg/dL — ABNORMAL HIGH (ref 70–99)
Potassium: 4.1 mmol/L (ref 3.5–5.1)
Sodium: 133 mmol/L — ABNORMAL LOW (ref 135–145)

## 2023-06-20 LAB — RAD ONC ARIA SESSION SUMMARY
Course Elapsed Days: 1
Plan Fractions Treated to Date: 2
Plan Prescribed Dose Per Fraction: 1.8 Gy
Plan Total Fractions Prescribed: 28
Plan Total Prescribed Dose: 50.4 Gy
Reference Point Dosage Given to Date: 3.6 Gy
Reference Point Session Dosage Given: 1.8 Gy
Session Number: 2

## 2023-06-20 LAB — BRAIN NATRIURETIC PEPTIDE: B Natriuretic Peptide: 96 pg/mL (ref 0.0–100.0)

## 2023-06-20 NOTE — Progress Notes (Addendum)
 Patient ID: Luis [name], male   DOB: 06-02-1955, 68 y.o.   MRN: [id number] PCP: Dr. Clent Ridges Cardiology: Dr. Shirlee Latch  CC: HF follow up  68 y.o. with history of CAD and moderate aortic stenosis presents for follow of AS and CAD. He had RCA PCI in 8/12. Metastatic bladder cancer previously treated with chemo and TURBT, now s/p cystoprostatectomy and lymph node dissection 11/24.  When I saw him in early 2016, he reported significant exertional dyspnea.  Did Lexiscan Cardiolite in 2/16.  This showed no ischemia or infarction.  Echo showed ?bicuspid aortic valve and probably moderate aortic stenosis. Given echo results RHC/LHC and TEE in 2/16. The RHC showed mildly elevated left and right heart filling pressures and the LHC showed nonobstructive CAD.  TEE confirmed bicuspid aortic valve with moderate AS. PFTs in 4/16 that were within normal limits. Started on Lasix 20 mg daily.  He seemed to improve.    Progressive dypnea in fall 2017.  RHC/LHC in 2017 showed mildly elevated left heart filling pressure and nonobstructive CAD.  TEE showed moderate aortic stenosis, bicuspid valve. Stable ascending aorta dilation.   Echo in 9/18 showed EF 60-65% with bicuspid aortic valve and moderate AS.  CTA chest showed 4.5 cm ascending aorta.  Repeat CTA chest in 8/19 showed stable 4.5 cm ascending aorta.  Echo in 8/20 showed EF 60-65%, moderate AS, 4.5 cm ascending aorta. CTA chest in 8/20 with 4.6 cm ascending aorta. Echo in 1/22 showed EF 60-65%, mild LVH, moderate AS with mean gradient 32 and AVA 1.6 cm^2, bicuspid aortic valve, ascending aorta 46 mm. CTA chest in 8/22 with 4.4 cm ascending aorta.   Echo 3/23 showed EF 60-65% with mild LVH, normal RV, bicuspid aortic valve with severe AS and mean gradient 48 mmHg, AVA 0.91 cm^2.  R/LHC showed mild CAD, patent RCA stents, normal PCWP, mildly elevated RA pressure, preserved CO.  Underwent AV replacement and replacement of ascending aorta with Dr. Laneta Simmers 5/23. Had post-op  atrial fibrillation, started on amiodarone/warfarin. Presented back to ED 1 week after discharge with LOC. Echo showed moderate pericardial effusion with maximal diameter of 3 cm over the right ventricle with some compression, LV and aortic valve function normal. Attempted pericardiocentesis in cath lab, but unsuccessful and placed sub-xiphoid pericardial window in OR, with 850 cc fluid removed. Discharged home, weight 275 lbs.  Echo 7/23 showed EF 60-65%, normal RV, stable bioprosthetic aortic valve.  Echo 8/24 showed EF 60-65%, normal RV, stable bioprosthetic aortic valve  He had hematuria and found to have recurrence of bladder cancer.  S/p cystoprostatectomy and lymph node dissection 03/03/23.    Admitted 12/24 with sepsis & AKI. BCx grew Enterococcus faecalis. ID consulted and he required vasopressors and ICU care. Echo showed EF 65-70%, RV normal. TEE showed EF 60-65%, RV normal, no valvular vegetation or thrombus. He was discharged home with PICC on IV abx.  RHC was done in 2/25 showing mean RA 12, PA 40/13, mean PCWP 11, CI 3.53, PVR 2.05, PAPi 2.75.  Lasix restarted at 20 mg daily.   Patient returns for followup of CHF.  He is now getting radiation for bladder cancer and will start on chemotherapy after this.  He fatigues easily and is short of breath walking up hills/inclines.  No chest pain.  No lightheadedness or palpitations. Weight is down 5 lbs.  He is anemic but denies BRBPR/melena.   ECG (personally reviewed): NSR, normal  Labs (6/23): K 4.1, creatinine 1.0, hgb 8.3, Lp(a) 116 Labs (  10/23): K 4.0, creatinine 1.34, LDL 71 Labs (7/24): K 3.7, creatinine 1.10, hgb 14.5 Labs (9/24): K 3.7, creatinine 1.19 Labs (11/24): K 3.5, creatinine 0.92, hgb 10.1 Labs (1/25): K 4.2, creatinine 1.98 Labs (2/25): transferrin saturation 13%, hgb 7.9, K 4.3, creatinine 2.07  PMH: 1. OSA: Using CPAP.  2. CAD: Cardiolite 8/12 with inferior infarct and peri-infarct ischemia, had PCI to the mid and  distal RCA with Promus DES x 2.  Lexiscan Cardiolite (2/16) with no ischemia or infarction. LHC/RHC (2/16) with mean RA 12, PA 32/15, mean PCWP 18, CI 3.47; patent mid and distal RCA stents, 50-60% proximal stenosis small PDA.   - LHC/RHC (9/17): 60% proximal PDA; mean RA 2, PA 24/8, mean PCWP 18, CI 2.13.  - Cardiolite (8/17): EF 56%, normal.  - R/LHC (3/23, AVR work up): mid RCA lesion 40% stenosis, previously placed prox RCA to Mid RCA stent (unknown type) widely patent, previously placed dist RCA stent (unknown type) widely patent; mean RA 13, PA 23/8, mean PCWP 11, CI 2.65.  3. Carotid stenosis: 2/15 carotid dopplers 1-39% bilateral ICA stenosis.  4. Colon polyps 5. HTN 6. Hyperlipidemia 7. Abdominal US (10/12) with no AAA 8. Aortic stenosis: Echo (2/15) with EF 60-65%, mild LVH, moderate diastolic dysfunction, normal RV size/systolic function, moderate aortic stenosis with mean gradient 31 and AVA 1.16 cm2, ascending aorta 4.4 cm.  Echo (2/16) with EF 60-65%, grade II diastolic dysfunction, ?bicuspid aortic valve with moderate AS (mean gradient 35 mmHg, AVA 1.5 cm^2), mildly dilated RV with normal systolic function.  TEE (2/16) with bicuspid aortic valve, moderate AS mean gradient 30 mmHg, AVA > 1 cm^2, EF 55-60%, mild LVH, mildly dilated RV with normal systolic function, mild AI, mild MR, ascending aorta 4.2 cm.  - Echo (8/17): EF 60-65%, grade II diastolic dysfunction, bicuspid aortic valve with mean gradient 31 mmHg, probably moderate stenosis.  - TEE (9/17): EF 55-60%, bicuspid aortic valve with moderate AS with mean gradient 24 mmHg and AVA 1.05 cm^2, ascending aorta 4.2 cm.  - Echo (9/18): EF 60-65%, bicuspid aortic valve, AVA 1.05 cm^2 with mean gradient 24 mmHg (moderate AS).  - Echo (8/20): EF 60-65%, mild RV dilation with normal systolic function, moderate AS with AVA 1.46 cm^2 and mean gradient 36 mmHg, 4.5 cm ascending aorta.  - Echo (1/22): EF 60-65%, mild LVH, moderate AS with mean  gradient 32 and AVA 1.6 cm^2, bicuspid aortic valve, ascending aorta 46 mm.  - Echo (3/23): EF 60-65% with mild LVH, normal RV, bicuspid aortic valve with severe AS and mean gradient 48 mmHg, AVA 0.91 cm^2. 4.5 cm ascending aorta.  - s/p bioprosthetic AVR with a 25 mm Edwards Resilia Aortic Valve and Replacement of Ascending Aorta (5/23). - Echo (7/23): EF 60-65%, normal RV, stable bioprosthetic aortic valve.  - Echo (8/24): EF 60-65%, normal RV, stable bioprosthetic aortic valve - Echo (12/24): EF 60-65%, normal RV, stable bioprosthetic aortic valve. - TEE (12/24): EF 60-65%, normal RV, stable bioprosthetic aortic valve, no vegetation or thrombus. - RHC (2/25): mean RA 12, PA 40/13, mean PCWP 11, CI 3.53, PVR 2.05, PAPi 2.75. 9. Ascending aorta aneurysm: 4.4 cm on 2/15 echo. Ascending aorta 4.2 cm TEE 2/16.  Ascending aorta 4.2 cm on 9/17 TEE.  - CTA chest (9/18): 4.5 cm ascending aorta.  - CTA chest (8/19): 4.5 cm ascending aorta - CTA chest (8/20): 4.6 cm ascending aorta - CTA chest (8/22): 4.4 cm ascending aorta - replacement of ascending aorta 5/23. 10. Chronic  diastolic CHF.  11. PFTs (4/16) were within normal limits.  12. Lower extremity arterial dopplers normal in 8/17.  13. Bladder cancer: S/p chemo and TURBT.  Recurrence with cystoprostatectomy and lymph node dissection 03/03/23. 14. Pericardial tamponade with pericardial window s/p AVR in 5/23.  15. Atrial fibrillation: Paroxysmal, noted post-op AVR.  16. Anemia: Fe deficiency.   SH: Quit smoking in 2013, married, works as Curator.  FH: Father with MI, AVR.  Sister with RyR2 gene.   ROS: All systems reviewed and negative except as per HPI.   Current Outpatient Medications  Medication Sig Dispense Refill   acetaminophen (TYLENOL) 325 MG tablet Take 650 mg by mouth every 6 (six) hours as needed (for pain).     amLODipine (NORVASC) 5 MG tablet Take 1 tablet (5 mg total) by mouth daily. 90 tablet 1   aspirin EC 81 MG tablet  Take 81 mg by mouth in the morning.     carvedilol (COREG) 3.125 MG tablet TAKE 1 TABLET BY MOUTH TWICE A DAY WITH FOOD 180 tablet 1   cyanocobalamin (VITAMIN B12) 1000 MCG tablet Take 1 tablet (1,000 mcg total) by mouth daily. 30 tablet 6   diazepam (VALIUM) 5 MG tablet TAKE 1 TABLET BY MOUTH EVERY 12 HOURS AS NEEDED FOR ANXIETY. 60 tablet 5   ferrous sulfate 325 (65 FE) MG tablet Take 325 mg by mouth daily with breakfast.     furosemide (LASIX) 20 MG tablet Take 1 tablet (20 mg total) by mouth daily. 45 tablet 3   ipratropium (ATROVENT) 0.03 % nasal spray Place 2 sprays into both nostrils every 12 (twelve) hours as needed for rhinitis.     lidocaine-prilocaine (EMLA) cream Apply 1 Application topically as needed. 30 g 0   Magnesium 500 MG TABS Take 500 mg by mouth in the morning.     meclizine (ANTIVERT) 25 MG tablet Take 1 tablet (25 mg total) by mouth 3 (three) times daily as needed for dizziness. 30 tablet 0   multivitamin-iron-minerals-folic acid (CENTRUM) chewable tablet Chew 1 tablet by mouth daily.     nitroGLYCERIN (NITROSTAT) 0.4 MG SL tablet Place 0.4 mg under the tongue every 5 (five) minutes as needed for chest pain.     ondansetron (ZOFRAN) 8 MG tablet Take 1 tablet (8 mg total) by mouth every 8 (eight) hours as needed. 30 tablet 0   potassium chloride SA (KLOR-CON M20) 20 MEQ tablet Take 1 tablet (20 mEq total) by mouth daily.     prochlorperazine (COMPAZINE) 10 MG tablet Take 1 tablet (10 mg total) by mouth every 6 (six) hours as needed for nausea or vomiting. 30 tablet 0   rosuvastatin (CRESTOR) 20 MG tablet TAKE 1 TABLET BY MOUTH EVERYDAY AT BEDTIME 90 tablet 3   venlafaxine XR (EFFEXOR-XR) 150 MG 24 hr capsule TAKE 1 CAPSULE BY MOUTH DAILY WITH BREAKFAST. 90 capsule 0   JARDIANCE 10 MG TABS tablet Take 1 tablet (10 mg total) by mouth daily. (Patient not taking: Reported on 06/20/2023) 30 tablet 11   No current facility-administered medications for this encounter.   Wt Readings  from Last 3 Encounters:  06/20/23 116.9 kg (257 lb 12.8 oz)  06/15/23 114.8 kg (253 lb)  06/13/23 117.6 kg (259 lb 5 oz)   BP 112/70   Pulse (!) 55   Wt 116.9 kg (257 lb 12.8 oz)   SpO2 98%   BMI 41.61 kg/m   Physical Exam General: NAD Neck: No JVD, no thyromegaly or thyroid nodule.  Lungs: Clear to auscultation bilaterally with normal respiratory effort. CV: Nondisplaced PMI.  Heart regular S1/S2, no S3/S4, 2/6 early SEM RUSB.  No peripheral edema.  No carotid bruit.  Normal pedal pulses.  Abdomen: Soft, nontender, no hepatosplenomegaly, no distention.  Skin: Intact without lesions or rashes.  Neurologic: Alert and oriented x 3.  Psych: Normal affect. Extremities: No clubbing or cyanosis.  HEENT: Normal.   Assessment/Plan: 1. CAD: Prior PCI to RCA in 8/12.  LHC (9/17) with nonobstructive disease. He was started on Imdur for ?microvascular angina but he developed severe headaches without any improvement in symptoms.  Underwent coronary angiography (3/23) as part of AVR work up and showed patent RCA stents with nonobstructive mild disease. No chest pain now.  - Continue ASA 81 and Crestor.  2. Aortic stenosis: Bicuspid valve with severe AS on 3/23 echo.  He is now s/p bioprosthetic AV replacement and replacement of ascending aorta 5/23. TEE in 12/24 at time of Enterococcus faecalis bacteremia showed stable bioprosthetic valve. 3. Hyperlipidemia: Continue Crestor.  4. Ascending aortic aneurysm: Associated with bicuspid aortic valve.  S/p replacement of ascending aorta 5/23.  5. Chronic diastolic CHF: Stable NYHA class II symptoms. Lasix recently restarted after 2/25 RHC with mildly elevated right-sided filling pressures.  - Avoiding Jardiance for now with bladder cancer and recent operation.  - Continue Lasix 20 mg daily, BMET/BNP today.  - Continue carvedilol 3.125 mg bid.  6. OSA: Continue CPAP.  7. HTN: BP controlled.  - Continue amlodipine.  8. Pericardial effusion: s/p  pericardial window 6/23 post-op.  9. Atrial fibrillation: Paroxysmal, only noted post-op. Anticoagulation was stopped with pericardial effusion and later hematuria. NSR on ECG today. - If AF recurs, will need DOAC.  10. Metastatic Bladder CA: Treated with pembrolizumab. Now S/p cystoprostatectomy and lymph node dissection 03/03/23. He has started radiation and then will have chemotherapy with gemcitabine and cisplatin.   11. Fe deficiency anemia: Transferrin saturation 13%, I will arrange for IV iron.   Follow up in 3 months with APP  I spent 32 minutes reviewing data, interviewing patient, and organizing the orders/followup.   Marca Ancona,  06/20/2023

## 2023-06-20 NOTE — Patient Instructions (Signed)
 There has been no changes to your medications.  Labs done today, your results will be available in MyChart, we will contact you for abnormal readings.  You will be called to have your iron infusion scheduled.   Your physician recommends that you schedule a follow-up appointment in: 3 months.  If you have any questions or concerns before your next appointment please send [place] a message through [place] or call our office at [phone].    TO LEAVE A MESSAGE FOR THE NURSE SELECT OPTION 2, PLEASE LEAVE A MESSAGE INCLUDING: YOUR NAME DATE OF BIRTH CALL BACK NUMBER REASON FOR CALL**this is important as we prioritize the call backs  YOU WILL RECEIVE A CALL BACK THE SAME DAY AS LONG AS YOU CALL BEFORE 4:00 PM  At the [facility], you and your health needs are our priority. As part of our continuing mission to provide you with exceptional heart care, we have created designated Provider Care Teams. These Care Teams include your primary Cardiologist (physician) and Advanced Practice Providers (APPs- Physician Assistants and Nurse Practitioners) who all work together to provide you with the care you need, when you need it.   You may see any of the following providers on your designated Care Team at your next follow up: Dr Arvilla Meres Dr Marca Ancona Dr. Dorthula Nettles Dr. Clearnce Hasten Amy Filbert Schilder, NP Robbie Lis, [place] [facility] [place], [place] Brynda Peon, NP [place] Lee, NP Clarisa Kindred, NP Karle Plumber, PharmD Enos Fling, PharmD   Please be sure to bring in all your medications bottles to every appointment.    Thank you for choosing [facility]

## 2023-06-21 ENCOUNTER — Ambulatory Visit
Admission: RE | Admit: 2023-06-21 | Discharge: 2023-06-21 | Disposition: A | Discharge status: Home / Self Care | Payer: PPO | Source: Ambulatory Visit | Attending: Radiation Oncology

## 2023-06-21 ENCOUNTER — Other Ambulatory Visit: Payer: Self-pay

## 2023-06-21 DIAGNOSIS — C678 Malignant neoplasm of overlapping sites of bladder: Secondary | ICD-10-CM | POA: Diagnosis not present

## 2023-06-21 DIAGNOSIS — Z510 Encounter for antineoplastic radiation therapy: Secondary | ICD-10-CM | POA: Diagnosis not present

## 2023-06-21 LAB — RAD ONC ARIA SESSION SUMMARY
Course Elapsed Days: 2
Plan Fractions Treated to Date: 3
Plan Prescribed Dose Per Fraction: 1.8 Gy
Plan Total Fractions Prescribed: 28
Plan Total Prescribed Dose: 50.4 Gy
Reference Point Dosage Given to Date: 5.4 Gy
Reference Point Session Dosage Given: 1.8 Gy
Session Number: 3

## 2023-06-21 NOTE — Addendum Note (Signed)
 Encounter addended by: Laurey Morale, MD on: 06/21/2023 12:03 AM  Actions taken: Clinical Note Signed

## 2023-06-22 ENCOUNTER — Ambulatory Visit
Admission: RE | Admit: 2023-06-22 | Discharge: 2023-06-22 | Disposition: A | Discharge status: Home / Self Care | Payer: PPO | Source: Ambulatory Visit | Attending: Radiation Oncology | Admitting: Radiation Oncology

## 2023-06-22 ENCOUNTER — Other Ambulatory Visit: Payer: Self-pay

## 2023-06-22 DIAGNOSIS — C678 Malignant neoplasm of overlapping sites of bladder: Secondary | ICD-10-CM | POA: Diagnosis not present

## 2023-06-22 DIAGNOSIS — Z510 Encounter for antineoplastic radiation therapy: Secondary | ICD-10-CM | POA: Diagnosis not present

## 2023-06-22 LAB — RAD ONC ARIA SESSION SUMMARY
Course Elapsed Days: 3
Plan Fractions Treated to Date: 4
Plan Prescribed Dose Per Fraction: 1.8 Gy
Plan Total Fractions Prescribed: 28
Plan Total Prescribed Dose: 50.4 Gy
Reference Point Dosage Given to Date: 7.2 Gy
Reference Point Session Dosage Given: 1.8 Gy
Session Number: 4

## 2023-06-23 ENCOUNTER — Other Ambulatory Visit: Payer: Self-pay

## 2023-06-23 ENCOUNTER — Ambulatory Visit
Admission: RE | Admit: 2023-06-23 | Discharge: 2023-06-23 | Disposition: A | Discharge status: Home / Self Care | Payer: PPO | Source: Ambulatory Visit | Attending: Radiation Oncology | Admitting: Radiation Oncology

## 2023-06-23 DIAGNOSIS — Z510 Encounter for antineoplastic radiation therapy: Secondary | ICD-10-CM | POA: Diagnosis not present

## 2023-06-23 DIAGNOSIS — C678 Malignant neoplasm of overlapping sites of bladder: Secondary | ICD-10-CM | POA: Diagnosis not present

## 2023-06-23 LAB — RAD ONC ARIA SESSION SUMMARY
Course Elapsed Days: 4
Plan Fractions Treated to Date: 5
Plan Prescribed Dose Per Fraction: 1.8 Gy
Plan Total Fractions Prescribed: 28
Plan Total Prescribed Dose: 50.4 Gy
Reference Point Dosage Given to Date: 9 Gy
Reference Point Session Dosage Given: 1.8 Gy
Session Number: 5

## 2023-06-26 ENCOUNTER — Other Ambulatory Visit: Payer: Self-pay

## 2023-06-26 ENCOUNTER — Ambulatory Visit
Admission: RE | Admit: 2023-06-26 | Discharge: 2023-06-26 | Disposition: A | Discharge status: Home / Self Care | Payer: PPO | Source: Ambulatory Visit | Attending: Radiation Oncology | Admitting: Radiation Oncology

## 2023-06-26 ENCOUNTER — Inpatient Hospital Stay (HOSPITAL_COMMUNITY)
Admission: EM | Admit: 2023-06-26 | Discharge: 2023-07-03 | DRG: 871 | Disposition: A | Discharge status: Home / Self Care | Attending: Family Medicine | Admitting: Family Medicine

## 2023-06-26 ENCOUNTER — Emergency Department (HOSPITAL_COMMUNITY)

## 2023-06-26 DIAGNOSIS — I10 Essential (primary) hypertension: Secondary | ICD-10-CM | POA: Diagnosis present

## 2023-06-26 DIAGNOSIS — Z953 Presence of xenogenic heart valve: Secondary | ICD-10-CM

## 2023-06-26 DIAGNOSIS — Z955 Presence of coronary angioplasty implant and graft: Secondary | ICD-10-CM

## 2023-06-26 DIAGNOSIS — A4159 Other Gram-negative sepsis: Principal | ICD-10-CM | POA: Diagnosis present

## 2023-06-26 DIAGNOSIS — N1832 Chronic kidney disease, stage 3b: Secondary | ICD-10-CM | POA: Diagnosis present

## 2023-06-26 DIAGNOSIS — D72829 Elevated white blood cell count, unspecified: Secondary | ICD-10-CM | POA: Diagnosis not present

## 2023-06-26 DIAGNOSIS — Z9049 Acquired absence of other specified parts of digestive tract: Secondary | ICD-10-CM | POA: Diagnosis not present

## 2023-06-26 DIAGNOSIS — Z906 Acquired absence of other parts of urinary tract: Secondary | ICD-10-CM

## 2023-06-26 DIAGNOSIS — C678 Malignant neoplasm of overlapping sites of bladder: Secondary | ICD-10-CM | POA: Insufficient documentation

## 2023-06-26 DIAGNOSIS — D649 Anemia, unspecified: Secondary | ICD-10-CM | POA: Diagnosis not present

## 2023-06-26 DIAGNOSIS — D509 Iron deficiency anemia, unspecified: Secondary | ICD-10-CM | POA: Diagnosis present

## 2023-06-26 DIAGNOSIS — G4733 Obstructive sleep apnea (adult) (pediatric): Secondary | ICD-10-CM | POA: Diagnosis present

## 2023-06-26 DIAGNOSIS — R7881 Bacteremia: Secondary | ICD-10-CM | POA: Diagnosis not present

## 2023-06-26 DIAGNOSIS — N179 Acute kidney failure, unspecified: Secondary | ICD-10-CM | POA: Diagnosis present

## 2023-06-26 DIAGNOSIS — Z510 Encounter for antineoplastic radiation therapy: Secondary | ICD-10-CM | POA: Insufficient documentation

## 2023-06-26 DIAGNOSIS — K254 Chronic or unspecified gastric ulcer with hemorrhage: Secondary | ICD-10-CM | POA: Diagnosis present

## 2023-06-26 DIAGNOSIS — I5032 Chronic diastolic (congestive) heart failure: Secondary | ICD-10-CM | POA: Diagnosis present

## 2023-06-26 DIAGNOSIS — Z801 Family history of malignant neoplasm of trachea, bronchus and lung: Secondary | ICD-10-CM

## 2023-06-26 DIAGNOSIS — N136 Pyonephrosis: Secondary | ICD-10-CM | POA: Diagnosis present

## 2023-06-26 DIAGNOSIS — E871 Hypo-osmolality and hyponatremia: Secondary | ICD-10-CM | POA: Diagnosis present

## 2023-06-26 DIAGNOSIS — E669 Obesity, unspecified: Secondary | ICD-10-CM | POA: Diagnosis present

## 2023-06-26 DIAGNOSIS — Z860101 Personal history of adenomatous and serrated colon polyps: Secondary | ICD-10-CM

## 2023-06-26 DIAGNOSIS — C679 Malignant neoplasm of bladder, unspecified: Secondary | ICD-10-CM | POA: Diagnosis present

## 2023-06-26 DIAGNOSIS — Z936 Other artificial openings of urinary tract status: Secondary | ICD-10-CM

## 2023-06-26 DIAGNOSIS — B9710 Unspecified enterovirus as the cause of diseases classified elsewhere: Secondary | ICD-10-CM | POA: Diagnosis present

## 2023-06-26 DIAGNOSIS — Z6839 Body mass index (BMI) 39.0-39.9, adult: Secondary | ICD-10-CM

## 2023-06-26 DIAGNOSIS — F419 Anxiety disorder, unspecified: Secondary | ICD-10-CM | POA: Diagnosis present

## 2023-06-26 DIAGNOSIS — Z7984 Long term (current) use of oral hypoglycemic drugs: Secondary | ICD-10-CM

## 2023-06-26 DIAGNOSIS — K219 Gastro-esophageal reflux disease without esophagitis: Secondary | ICD-10-CM | POA: Diagnosis present

## 2023-06-26 DIAGNOSIS — R6510 Systemic inflammatory response syndrome (SIRS) of non-infectious origin without acute organ dysfunction: Secondary | ICD-10-CM | POA: Diagnosis not present

## 2023-06-26 DIAGNOSIS — Z7982 Long term (current) use of aspirin: Secondary | ICD-10-CM

## 2023-06-26 DIAGNOSIS — I2510 Atherosclerotic heart disease of native coronary artery without angina pectoris: Secondary | ICD-10-CM | POA: Diagnosis not present

## 2023-06-26 DIAGNOSIS — R6520 Severe sepsis without septic shock: Secondary | ICD-10-CM | POA: Diagnosis present

## 2023-06-26 DIAGNOSIS — K259 Gastric ulcer, unspecified as acute or chronic, without hemorrhage or perforation: Secondary | ICD-10-CM | POA: Diagnosis not present

## 2023-06-26 DIAGNOSIS — Z87891 Personal history of nicotine dependence: Secondary | ICD-10-CM

## 2023-06-26 DIAGNOSIS — Z800 Family history of malignant neoplasm of digestive organs: Secondary | ICD-10-CM

## 2023-06-26 DIAGNOSIS — R7303 Prediabetes: Secondary | ICD-10-CM | POA: Diagnosis present

## 2023-06-26 DIAGNOSIS — R7401 Elevation of levels of liver transaminase levels: Secondary | ICD-10-CM | POA: Diagnosis present

## 2023-06-26 DIAGNOSIS — Z1152 Encounter for screening for COVID-19: Secondary | ICD-10-CM

## 2023-06-26 DIAGNOSIS — Z436 Encounter for attention to other artificial openings of urinary tract: Secondary | ICD-10-CM | POA: Diagnosis not present

## 2023-06-26 DIAGNOSIS — T83098A Other mechanical complication of other indwelling urethral catheter, initial encounter: Secondary | ICD-10-CM | POA: Diagnosis not present

## 2023-06-26 DIAGNOSIS — Z954 Presence of other heart-valve replacement: Secondary | ICD-10-CM

## 2023-06-26 DIAGNOSIS — N289 Disorder of kidney and ureter, unspecified: Secondary | ICD-10-CM

## 2023-06-26 DIAGNOSIS — K921 Melena: Secondary | ICD-10-CM | POA: Diagnosis not present

## 2023-06-26 DIAGNOSIS — F32A Depression, unspecified: Secondary | ICD-10-CM | POA: Diagnosis present

## 2023-06-26 DIAGNOSIS — B348 Other viral infections of unspecified site: Secondary | ICD-10-CM | POA: Diagnosis not present

## 2023-06-26 DIAGNOSIS — R7989 Other specified abnormal findings of blood chemistry: Secondary | ICD-10-CM | POA: Diagnosis not present

## 2023-06-26 DIAGNOSIS — G8929 Other chronic pain: Secondary | ICD-10-CM | POA: Diagnosis present

## 2023-06-26 DIAGNOSIS — R509 Fever, unspecified: Principal | ICD-10-CM

## 2023-06-26 DIAGNOSIS — D696 Thrombocytopenia, unspecified: Secondary | ICD-10-CM | POA: Diagnosis present

## 2023-06-26 DIAGNOSIS — E785 Hyperlipidemia, unspecified: Secondary | ICD-10-CM | POA: Diagnosis present

## 2023-06-26 DIAGNOSIS — K3189 Other diseases of stomach and duodenum: Secondary | ICD-10-CM | POA: Diagnosis not present

## 2023-06-26 DIAGNOSIS — N281 Cyst of kidney, acquired: Secondary | ICD-10-CM | POA: Diagnosis not present

## 2023-06-26 DIAGNOSIS — Z79899 Other long term (current) drug therapy: Secondary | ICD-10-CM

## 2023-06-26 DIAGNOSIS — G2581 Restless legs syndrome: Secondary | ICD-10-CM | POA: Diagnosis present

## 2023-06-26 DIAGNOSIS — I252 Old myocardial infarction: Secondary | ICD-10-CM

## 2023-06-26 DIAGNOSIS — I130 Hypertensive heart and chronic kidney disease with heart failure and stage 1 through stage 4 chronic kidney disease, or unspecified chronic kidney disease: Secondary | ICD-10-CM | POA: Diagnosis present

## 2023-06-26 DIAGNOSIS — B9789 Other viral agents as the cause of diseases classified elsewhere: Secondary | ICD-10-CM | POA: Diagnosis present

## 2023-06-26 DIAGNOSIS — N1330 Unspecified hydronephrosis: Secondary | ICD-10-CM | POA: Diagnosis not present

## 2023-06-26 DIAGNOSIS — I4891 Unspecified atrial fibrillation: Secondary | ICD-10-CM | POA: Diagnosis not present

## 2023-06-26 DIAGNOSIS — B961 Klebsiella pneumoniae [K. pneumoniae] as the cause of diseases classified elsewhere: Secondary | ICD-10-CM | POA: Diagnosis not present

## 2023-06-26 LAB — COMPREHENSIVE METABOLIC PANEL
ALT: 27 U/L (ref 0–44)
AST: 32 U/L (ref 15–41)
Albumin: 2.8 g/dL — ABNORMAL LOW (ref 3.5–5.0)
Alkaline Phosphatase: 101 U/L (ref 38–126)
Anion gap: 10 (ref 5–15)
BUN: 30 mg/dL — ABNORMAL HIGH (ref 8–23)
CO2: 18 mmol/L — ABNORMAL LOW (ref 22–32)
Calcium: 8 mg/dL — ABNORMAL LOW (ref 8.9–10.3)
Chloride: 101 mmol/L (ref 98–111)
Creatinine, Ser: 2.26 mg/dL — ABNORMAL HIGH (ref 0.61–1.24)
GFR, Estimated: 31 mL/min — ABNORMAL LOW (ref >60)
Glucose, Bld: 191 mg/dL — ABNORMAL HIGH (ref 70–99)
Potassium: 4.4 mmol/L (ref 3.5–5.1)
Sodium: 129 mmol/L — ABNORMAL LOW (ref 135–145)
Total Bilirubin: 0.8 mg/dL (ref 0.0–1.2)
Total Protein: 7.8 g/dL (ref 6.5–8.1)

## 2023-06-26 LAB — CBC WITH DIFFERENTIAL/PLATELET
Abs Immature Granulocytes: 0.12 10*3/uL — ABNORMAL HIGH (ref 0.00–0.07)
Basophils Absolute: 0 10*3/uL (ref 0.0–0.1)
Basophils Relative: 0 %
Eosinophils Absolute: 0.1 10*3/uL (ref 0.0–0.5)
Eosinophils Relative: 1 %
HCT: 18 % — ABNORMAL LOW (ref 39.0–52.0)
Hemoglobin: 5.8 g/dL — CL (ref 13.0–17.0)
Immature Granulocytes: 1 %
Lymphocytes Relative: 4 %
Lymphs Abs: 0.5 10*3/uL — ABNORMAL LOW (ref 0.7–4.0)
MCH: 28.7 pg (ref 26.0–34.0)
MCHC: 32.2 g/dL (ref 30.0–36.0)
MCV: 89.1 fL (ref 80.0–100.0)
Monocytes Absolute: 0.6 10*3/uL (ref 0.1–1.0)
Monocytes Relative: 5 %
Neutro Abs: 10.9 10*3/uL — ABNORMAL HIGH (ref 1.7–7.7)
Neutrophils Relative %: 89 %
Platelets: 252 10*3/uL (ref 150–400)
RBC: 2.02 MIL/uL — ABNORMAL LOW (ref 4.22–5.81)
RDW: 16.4 % — ABNORMAL HIGH (ref 11.5–15.5)
WBC: 12.3 10*3/uL — ABNORMAL HIGH (ref 4.0–10.5)
nRBC: 0 % (ref 0.0–0.2)

## 2023-06-26 LAB — RAD ONC ARIA SESSION SUMMARY
Course Elapsed Days: 7
Plan Fractions Treated to Date: 6
Plan Prescribed Dose Per Fraction: 1.8 Gy
Plan Total Fractions Prescribed: 28
Plan Total Prescribed Dose: 50.4 Gy
Reference Point Dosage Given to Date: 10.8 Gy
Reference Point Session Dosage Given: 1.8 Gy
Session Number: 6

## 2023-06-26 LAB — I-STAT CHEM 8, ED
BUN: 40 mg/dL — ABNORMAL HIGH (ref 8–23)
Calcium, Ion: 0.97 mmol/L — ABNORMAL LOW (ref 1.15–1.40)
Chloride: 106 mmol/L (ref 98–111)
Creatinine, Ser: 2.7 mg/dL — ABNORMAL HIGH (ref 0.61–1.24)
Glucose, Bld: 172 mg/dL — ABNORMAL HIGH (ref 70–99)
HCT: 24 % — ABNORMAL LOW (ref 39.0–52.0)
Hemoglobin: 8.2 g/dL — ABNORMAL LOW (ref 13.0–17.0)
Potassium: 5.9 mmol/L — ABNORMAL HIGH (ref 3.5–5.1)
Sodium: 133 mmol/L — ABNORMAL LOW (ref 135–145)
TCO2: 21 mmol/L — ABNORMAL LOW (ref 22–32)

## 2023-06-26 LAB — I-STAT CG4 LACTIC ACID, ED: Lactic Acid, Venous: 2.1 mmol/L (ref 0.5–1.9)

## 2023-06-26 LAB — RESP PANEL BY RT-PCR (RSV, FLU A&B, COVID)  RVPGX2
Influenza A by PCR: NEGATIVE
Influenza B by PCR: NEGATIVE
Resp Syncytial Virus by PCR: NEGATIVE
SARS Coronavirus 2 by RT PCR: NEGATIVE

## 2023-06-26 LAB — APTT: aPTT: 34 s (ref 24–36)

## 2023-06-26 LAB — POC OCCULT BLOOD, ED: Fecal Occult Bld: NEGATIVE

## 2023-06-26 LAB — PROTIME-INR
INR: 1.3 — ABNORMAL HIGH (ref 0.8–1.2)
Prothrombin Time: 15.9 s — ABNORMAL HIGH (ref 11.4–15.2)

## 2023-06-26 MED ORDER — SODIUM CHLORIDE 0.9% IV SOLUTION: Freq: Once | INTRAVENOUS | Status: DC

## 2023-06-26 MED ORDER — PANTOPRAZOLE SODIUM 40 MG IV SOLR
80.0000 mg | Freq: Once | INTRAVENOUS | Status: AC
  Administered 2023-06-27: 80 mg via INTRAVENOUS
  Filled 2023-06-26: qty 20

## 2023-06-26 MED ORDER — AMPICILLIN-SULBACTAM SODIUM 3 (2-1) G IJ SOLR
3.0000 g | Freq: Three times a day (TID) | INTRAMUSCULAR | Status: DC
  Administered 2023-06-27 (×2): 3 g via INTRAVENOUS
  Filled 2023-06-26 (×4): qty 8

## 2023-06-26 MED ORDER — ACETAMINOPHEN 500 MG PO TABS
1000.0000 mg | ORAL_TABLET | Freq: Once | ORAL | Status: AC
  Administered 2023-06-26: 1000 mg via ORAL
  Filled 2023-06-26: qty 2

## 2023-06-26 NOTE — ED Notes (Signed)
 PA notified about lactic result.

## 2023-06-26 NOTE — ED Provider Triage Note (Signed)
 Emergency Medicine Provider Triage Evaluation Note  Luis [name] , a 68 y.o. male  was evaluated in triage.  Pt complains of fever.  Started 2 nights ago.  Endorses some diarrhea.  His wife also states that his urine from his urostomy bag may be somewhat malodorous as well.  He denies any abdominal pain.  Endorses some nasal congestion but no cough.  Denies chest pain shortness of breath.  Has history of bladder cancer status post cystectomy in November of last year.  Review of Systems  Positive: See above Negative: See above  Physical Exam  BP (!) 168/69 (BP Location: Right Arm)   Pulse 71   Temp (!) 101 F (38.3 C) (Oral)   Resp 16   SpO2 99%  Gen:   Awake, no distress   Resp:  Normal effort  MSK:   Moves extremities without difficulty  Other:    Medical Decision Making  Medically screening exam initiated at 8:58 PM.  Appropriate orders placed.  [name] was informed that the remainder of the evaluation will be completed by another provider, this initial triage assessment does not replace that evaluation, and the importance of remaining in the ED until their evaluation is complete.  Work up started   [name], [place] 06/26/23 2101

## 2023-06-26 NOTE — ED Provider Notes (Signed)
 [place] EMERGENCY DEPARTMENT AT [facility] Provider Note   CSN: [id number] Arrival date & time: 06/26/23  2023     History  Chief Complaint  Patient presents with   Fever   HPI Luis [name] is a 68 y.o. male with hx of bladder cancer status post cystectomy presenting for fever. Started 2 nights ago. Endorses some diarrhea. His wife also states that his urine from his urostomy bag may be somewhat malodorous as well. He denies any abdominal pain. Endorses some nasal congestion but no cough. Denies chest pain shortness of breath. Has history of bladder cancer status post cystectomy in November of last year.    Fever      Home Medications Prior to Admission medications   Medication Sig Start Date End Date Taking? Authorizing Provider  acetaminophen (TYLENOL) 325 MG tablet Take 650 mg by mouth every 6 (six) hours as needed (for pain).   Yes [provider]  amLODipine (NORVASC) 5 MG tablet Take 1 tablet (5 mg total) by mouth daily. 05/16/23  Yes Jaci Standard, MD  aspirin EC 81 MG tablet Take 81 mg by mouth in the morning.   Yes [provider]  carvedilol (COREG) 3.125 MG tablet TAKE 1 TABLET BY MOUTH TWICE A DAY WITH FOOD 05/10/23  Yes Laurey Morale, MD  cyanocobalamin (VITAMIN B12) 1000 MCG tablet Take 1 tablet (1,000 mcg total) by mouth daily. 06/15/23  Yes Jaci Standard, MD  diazepam (VALIUM) 5 MG tablet TAKE 1 TABLET BY MOUTH EVERY 12 HOURS AS NEEDED FOR ANXIETY. 03/29/23  Yes Nelwyn Salisbury, MD  furosemide (LASIX) 20 MG tablet Take 1 tablet (20 mg total) by mouth daily. 06/15/23  Yes Laurey Morale, MD  ipratropium (ATROVENT) 0.03 % nasal spray Place 2 sprays into both nostrils every 12 (twelve) hours as needed for rhinitis.   Yes [provider]  lidocaine-prilocaine (EMLA) cream Apply 1 Application topically as needed. 05/16/23  Yes Jaci Standard, MD  Magnesium 500 MG TABS Take 500 mg by mouth in the morning.   Yes  [provider]  meclizine (ANTIVERT) 25 MG tablet Take 1 tablet (25 mg total) by mouth 3 (three) times daily as needed for dizziness. 09/09/22  Yes Nelwyn Salisbury, MD  multivitamin-iron-minerals-folic acid (CENTRUM) chewable tablet Chew 1 tablet by mouth daily. 09/23/21  Yes Barrett, Erin R, PA-C  nitroGLYCERIN (NITROSTAT) 0.4 MG SL tablet Place 0.4 mg under the tongue every 5 (five) minutes as needed for chest pain. 11/03/17 12/05/25 Yes [provider]  ondansetron (ZOFRAN) 8 MG tablet Take 1 tablet (8 mg total) by mouth every 8 (eight) hours as needed. 05/16/23  Yes Jaci Standard, MD  potassium chloride SA (KLOR-CON M20) 20 MEQ tablet Take 1 tablet (20 mEq total) by mouth daily. 05/26/23  Yes Milford, Anderson Malta, FNP  prochlorperazine (COMPAZINE) 10 MG tablet Take 1 tablet (10 mg total) by mouth every 6 (six) hours as needed for nausea or vomiting. 05/16/23  Yes Jaci Standard, MD  rosuvastatin (CRESTOR) 20 MG tablet TAKE 1 TABLET BY MOUTH EVERYDAY AT BEDTIME 02/06/23  Yes Milford, Jessica M, FNP  venlafaxine XR (EFFEXOR-XR) 150 MG 24 hr capsule TAKE 1 CAPSULE BY MOUTH DAILY WITH BREAKFAST. 05/11/23  Yes Nelwyn Salisbury, MD  JARDIANCE 10 MG TABS tablet Take 1 tablet (10 mg total) by mouth daily. Patient not taking: Reported on 06/20/2023 05/10/23   Laurey Morale, MD  Allergies    Patient has no known allergies.    Review of Systems   Review of Systems  Constitutional:  Positive for fever.    Physical Exam   Vitals:   06/27/23 1653 06/27/23 2313  BP: 137/64 126/60  Pulse: 62 62  Resp:  18  Temp:  98.1 F (36.7 C)  SpO2:  98%    CONSTITUTIONAL:  well-appearing, NAD NEURO:  Alert and oriented x 3, CN 3-12 grossly intact EYES:  eyes equal and reactive ENT/NECK:  Supple, no stridor  CARDIO: Regular rate and rhythm, appears well-perfused  PULM:  No respiratory distress, CTAB GI/GU:  non-distended, soft, non tender, no erythema or tenderness about the urostomy  site MSK/SPINE:  No gross deformities, no edema, moves all extremities  SKIN:  no rash, atraumatic  *Additional and/or pertinent findings included in MDM below  ED Results / Procedures / Treatments   Labs (all labs ordered are listed, but only abnormal results are displayed) Labs Reviewed  RESPIRATORY PANEL BY PCR - Abnormal; Notable for the following components:      Result Value   Rhinovirus / Enterovirus DETECTED (*)    All other components within normal limits  BLOOD CULTURE ID PANEL (REFLEXED) - BCID2 - Abnormal; Notable for the following components:   Enterobacterales DETECTED (*)    Klebsiella oxytoca DETECTED (*)    All other components within normal limits  COMPREHENSIVE METABOLIC PANEL - Abnormal; Notable for the following components:   Sodium 129 (*)    CO2 18 (*)    Glucose, Bld 191 (*)    BUN 30 (*)    Creatinine, Ser 2.26 (*)    Calcium 8.0 (*)    Albumin 2.8 (*)    GFR, Estimated 31 (*)    All other components within normal limits  CBC WITH DIFFERENTIAL/PLATELET - Abnormal; Notable for the following components:   WBC 12.3 (*)    RBC 2.02 (*)    Hemoglobin 5.8 (*)    HCT 18.0 (*)    RDW 16.4 (*)    Neutro Abs 10.9 (*)    Lymphs Abs 0.5 (*)    Abs Immature Granulocytes 0.12 (*)    All other components within normal limits  PROTIME-INR - Abnormal; Notable for the following components:   Prothrombin Time 15.9 (*)    INR 1.3 (*)    All other components within normal limits  URINALYSIS, W/ REFLEX TO CULTURE (INFECTION SUSPECTED) - Abnormal; Notable for the following components:   APPearance HAZY (*)    Hgb urine dipstick SMALL (*)    Leukocytes,Ua LARGE (*)    Bacteria, UA MANY (*)    All other components within normal limits  IRON AND TIBC - Abnormal; Notable for the following components:   Iron 21 (*)    TIBC 202 (*)    Saturation Ratios 10 (*)    All other components within normal limits  FERRITIN - Abnormal; Notable for the following components:    Ferritin 570 (*)    All other components within normal limits  RETICULOCYTES - Abnormal; Notable for the following components:   RBC. 2.39 (*)    Immature Retic Fract 25.4 (*)    All other components within normal limits  CBC - Abnormal; Notable for the following components:   RBC 2.89 (*)    Hemoglobin 8.1 (*)    HCT 25.3 (*)    RDW 16.3 (*)    All other components within normal limits  COMPREHENSIVE METABOLIC PANEL -  Abnormal; Notable for the following components:   Sodium 134 (*)    CO2 20 (*)    Glucose, Bld 162 (*)    BUN 32 (*)    Creatinine, Ser 2.59 (*)    Calcium 7.9 (*)    Albumin 2.5 (*)    GFR, Estimated 26 (*)    All other components within normal limits  I-STAT CG4 LACTIC ACID, ED - Abnormal; Notable for the following components:   Lactic Acid, Venous 2.1 (*)    All other components within normal limits  I-STAT CHEM 8, ED - Abnormal; Notable for the following components:   Sodium 133 (*)    Potassium 5.9 (*)    BUN 40 (*)    Creatinine, Ser 2.70 (*)    Glucose, Bld 172 (*)    Calcium, Ion 0.97 (*)    TCO2 21 (*)    Hemoglobin 8.2 (*)    HCT 24.0 (*)    All other components within normal limits  I-STAT CG4 LACTIC ACID, ED - Abnormal; Notable for the following components:   Lactic Acid, Venous 0.4 (*)    All other components within normal limits  RESP PANEL BY RT-PCR (RSV, FLU A&B, COVID)  [id number]  CULTURE, BLOOD (ROUTINE X 2)  CULTURE, BLOOD (ROUTINE X 2)  URINE CULTURE  APTT  VITAMIN B12  FOLATE  COMPREHENSIVE METABOLIC PANEL  CBC  MAGNESIUM  PHOSPHORUS  I-STAT CHEM 8, ED  I-STAT CG4 LACTIC ACID, ED  POC OCCULT BLOOD, ED  TYPE AND SCREEN  PREPARE RBC (CROSSMATCH)    EKG EKG Interpretation Date/Time:  Monday June 26 2023 21:07:41 EST Ventricular Rate:  71 PR Interval:  167 QRS Duration:  100 QT Interval:  386 QTC Calculation: 420 R Axis:   58  Text Interpretation: Sinus rhythm Baseline wander in lead(s) V2 No acute changes No significant  change since last tracing Confirmed by Derwood Kaplan [phone]) on 06/26/2023 9:44:20 PM  Radiology CT ABDOMEN PELVIS WO CONTRAST Result Date: 06/27/2023 CLINICAL DATA:  Fever. EXAM: CT ABDOMEN AND PELVIS WITHOUT CONTRAST TECHNIQUE: Multidetector CT imaging of the abdomen and pelvis was performed following the standard protocol without IV contrast. RADIATION DOSE REDUCTION: This exam was performed according to the departmental dose-optimization program which includes automated exposure control, adjustment of the mA and/or kV according to patient size and/or use of iterative reconstruction technique. COMPARISON:  April 13, 2023. FINDINGS: Lower chest: No acute abnormality. Hepatobiliary: No focal liver abnormality is seen. Status post cholecystectomy. No biliary dilatation. Pancreas: Unremarkable. No pancreatic ductal dilatation or surrounding inflammatory changes. Spleen: Normal in size without focal abnormality. Adrenals/Urinary Tract: Adrenal glands appear normal. Patient is status post cystectomy with ileal conduit placement. Mild bilateral hydroureteronephrosis is noted without obstructing calculus. Stable left renal cysts are noted. Stomach/Bowel: The stomach is unremarkable. There is no evidence of bowel obstruction or inflammation. Vascular/Lymphatic: Aortic atherosclerosis. No enlarged abdominal or pelvic lymph nodes. Reproductive: Status post prostatectomy. Other: No ascites or hernia is noted. Musculoskeletal: No acute or significant osseous findings. IMPRESSION: Status post cystectomy with ileal conduit placement, as well as prostatectomy. Mild bilateral hydroureteronephrosis is noted without obstructing calculus. Aortic Atherosclerosis (ICD10-I70.0). Electronically Signed   By: Lupita Raider M.D.   On: 06/27/2023 15:28   DG Chest Port 1 View Result Date: 06/27/2023 CLINICAL DATA:  Fevers EXAM: PORTABLE CHEST 1 VIEW COMPARISON:  05/16/2023 CT FINDINGS: Cardiac shadow is within normal limits. Lungs  are well aerated bilaterally. Postsurgical changes are seen. Right chest wall  port is noted in satisfactory position. No bony abnormality is noted. IMPRESSION: No active disease. Electronically Signed   By: Alcide Clever M.D.   On: 06/27/2023 00:06    Procedures .Critical Care  Performed by: [name], PA-C Authorized by: [name], PA-C   Critical care provider statement:    Critical care time (minutes):  30   Critical care was necessary to treat or prevent imminent or life-threatening deterioration of the following conditions:  Sepsis (concer for sepsis, source unclear at this time)   Critical care was time spent personally by me on the following activities:  Development of treatment plan with patient or surrogate, discussions with consultants, evaluation of patient's response to treatment, examination of patient, ordering and review of laboratory studies, ordering and review of radiographic studies, ordering and performing treatments and interventions, pulse oximetry, re-evaluation of patient's condition and review of old charts     Medications Ordered in ED Medications  0.9 %  sodium chloride infusion (Manually program via Guardrails IV Fluids) (0 mLs Intravenous Hold 06/27/23 0309)  sodium chloride flush (NS) 0.9 % injection 3 mL (3 mLs Intravenous Given 06/27/23 1035)  acetaminophen (TYLENOL) tablet 650 mg (has no administration in time range)    Or  acetaminophen (TYLENOL) suppository 650 mg (has no administration in time range)  ondansetron (ZOFRAN) tablet 4 mg (has no administration in time range)    Or  ondansetron (ZOFRAN) injection 4 mg (has no administration in time range)  senna-docusate (Senokot-S) tablet 1 tablet (has no administration in time range)  amLODipine (NORVASC) tablet 5 mg (5 mg Oral Given 06/27/23 1048)  carvedilol (COREG) tablet 3.125 mg (3.125 mg Oral Given 06/27/23 1653)  diazepam (VALIUM) tablet 5 mg (has no administration in time range)  potassium  chloride SA (KLOR-CON M) CR tablet 20 mEq (20 mEq Oral Given 06/27/23 1048)  rosuvastatin (CRESTOR) tablet 20 mg (20 mg Oral Given 06/27/23 2130)  venlafaxine XR (EFFEXOR-XR) 24 hr capsule 150 mg (150 mg Oral Given 06/27/23 1047)  cefTRIAXone (ROCEPHIN) 2 g in sodium chloride 0.9 % 100 mL IVPB (2 g Intravenous New Bag/Given 06/27/23 1653)  acetaminophen (TYLENOL) tablet 1,000 mg (1,000 mg Oral Given 06/26/23 2245)  pantoprazole (PROTONIX) injection 80 mg (80 mg Intravenous Given 06/27/23 0128)    ED Course/ Medical Decision Making/ A&P Clinical Course as of 06/27/23 2320  Mon Jun 26, 2023  2143 TCO2(!): 21 [JR]    Clinical Course User Index [JR] [name], PA-C                                 Medical Decision Making Amount and/or Complexity of Data Reviewed Labs: ordered. Decision-making details documented in ED Course. Radiology: ordered.  Risk OTC drugs. Prescription drug management. Decision regarding hospitalization.   Initial Impression and Ddx 68 year old well-appearing male present for fever. Exam was unremarkable.  DDx includes UTI, sepsis, intra-abdominal infection, pneumonia, viral process, other. Patient PMH that increases complexity of ED encounter:  hx of bladder cancer status post cystectomy  Interpretation of Diagnostics - I independent reviewed and interpreted the labs as followed: lactic acidosis (2.1), leukocytosis, anemia (hgb 5.8)  - CXR pending  - I personally reviewed and interpreted EKG which revealed sinus rhythm  Patient Reassessment and Ultimate Disposition/Management Initial labs somewhat concerning for sepsis without origin.  Started on IV antibiotics.  Awaiting chest x-ray.  Will likely need admission for sepsis evaluation.  Signed out patient to Dr. Derwood Kaplan.   Patient management required discussion with the following services or consulting groups:  None  Complexity of Problems Addressed Acute complicated illness or Injury  Additional  Data Reviewed and Analyzed Further history obtained from: Further history from spouse/family member and Past medical history and medications listed in the EMR  Patient Encounter Risk Assessment Consideration of hospitalization         Final Clinical Impression(s) / ED Diagnoses Final diagnoses:  Fever, unspecified fever cause    Rx / DC Orders ED Discharge Orders     None         [name], PA-C 06/27/23 2320    Derwood Kaplan, MD 06/30/23 1041

## 2023-06-26 NOTE — Progress Notes (Signed)
 Pharmacy Antibiotic Note  Luis [name] is a 68 y.o. male admitted on 06/26/2023 with  hx of bladder cancer status post cystectomy presenting for fever. .  Pharmacy has been consulted for unasyn dosing.  Plan: Unasyn 3gm IV q8h Follow renal function, cultures and clinical course     Temp (24hrs), Avg:100.9 F (38.3 C), Min:100.7 F (38.2 C), Max:101 F (38.3 C)  Recent Labs  Lab 06/20/23 1012 06/26/23 2053 06/26/23 2054 06/26/23 2100  WBC  --   --   --  12.3*  CREATININE 2.05*  --  2.70* 2.26*  LATICACIDVEN  --  2.1*  --   --     Estimated Creatinine Clearance: 37.1 mL/min (A) (by C-G formula based on SCr of 2.26 mg/dL (H)).    No Known Allergies  Antimicrobials this admission: 3/3 unasyn >>  Dose adjustments this admission:   Microbiology results: 3/3 BCx:   Thank you for allowing pharmacy to be a part of this patient's care. Arley Phenix RPh 06/26/2023, 11:20 PM

## 2023-06-26 NOTE — H&P (Signed)
 History and Physical    CHESKEL SILVERIO WUJ:[id number] DOB: May 29, 1955 DOA: 06/26/2023  PCP: Nelwyn Salisbury, MD  Patient coming from: Home  I have personally briefly reviewed patient's old medical records in [facility]  Chief Complaint: Fever, generalized weakness  HPI: Luis [name] is a 68 y.o. male with medical history significant for high-grade bladder cancer s/p cystoprostatectomy with urostomy in place receiving radiation treatment, chronic HFpEF, CAD s/p PCI to RCA, severe AS s/p bioprosthetic AVR and ascending aorta replacement, iron deficiency anemia, HTN, HLD, depression/anxiety, OSA on CPAP who presented to the ED for evaluation of fever.  Patient reports feeling generally weak for the last week.  2 nights ago he developed new fever and has been having shaking chills intermittently.  He has had dry heaves but no emesis.  He reports mild intermittent cough that has been nonproductive.  Denies chest pain or dyspnea.  He is diaphoretic today.  He reports good urine output via his ostomy.  He denies any diarrhea.  He has not seen significant swelling in his extremities.  He has a history of septic shock due to Enterococcus faecalis bacteremia in December and completed 4 weeks of IV ampicillin.  There was no evidence of endocarditis on TEE and no infection of aortic graft on CT chest.  He was concerned that he might be developing sepsis again therefore came to the ED for further evaluation.  He is currently receiving radiation treatment for his bladder cancer.  He is planned to start on chemotherapy with cisplatin and gemcitabine which has been delayed to his renal dysfunction which has been an ongoing issue for the last 3 months.  He has an appointment with [facility] as a new consult.  ED Course  Labs/Imaging on admission: I have personally reviewed following labs and imaging studies.  Initial vitals showed BP 141/63, pulse 69, RR 16, temp 101 F, SpO2 98% on  room air.  Labs showed WBC 12.3, hemoglobin 5.8 (7.5 on 06/15/23), platelets 252,000, sodium 129, potassium 4.4, bicarb 18, BUN 30, creatinine 2.26, serum glucose 191, LFTs within normal limits, lactic acid 2.1.  SARS-CoV-2, influenza, RSV PCR negative.  UA pending collection.  Blood cultures in process.  FOBT negative.  Portable chest x-ray negative for focal consolidation, edema, effusion.  Patient was given IV Protonix 80 mg and IV Unasyn.  The hospitalist service was consulted to admit.  Review of Systems: All systems reviewed and are negative except as documented in history of present illness above.   Past Medical History:  Diagnosis Date   Anxiety    takes Valium as needed   Aortic stenosis, moderate    Arthritis    Ascending aortic aneurysm (HCC)    CAD (coronary artery disease)    a. s/p PCI of the RCA 8/12 with DES by Dr Excell Seltzer, preserved EF. b. LHC/RHC (2/16) with mean RA 12, PA 32/15, mean PCWP 18, CI 3.47; patent mid and distal RCA stents, 50-60% proximal stenosis small PDA.      Cancer [facility])    bladder   Carotid stenosis    a. Carotid US (05/2013):  Bilateral 1-39% ICA; L thyroid nodule (prior hx of aspiration).   Chronic diastolic CHF (congestive heart failure) (HCC)    Complication of anesthesia    difficulty waking up after gallbladder surgery   Depression    Dyspnea    Essential hypertension    GERD (gastroesophageal reflux disease)    if needed will take OTC meds  Heart murmur    History of colonic polyps    hyperplastic   Hyperlipidemia    Joint pain    Lesion of bladder    Myocardial infarction (HCC) 2012   Obesity (BMI 30-39.9) 02/29/2016   Pre-diabetes    Restless leg    Sleep apnea    uses cpap   Tubular adenoma of colon    Vertigo    takes Meclizine as needed    Past Surgical History:  Procedure Laterality Date   AORTIC VALVE REPLACEMENT N/A 09/16/2021   Procedure: AORTIC VALVE REPLACEMENT (AVR);  Surgeon: Alleen Borne, MD;  Location: [facility]  OR;  Service: Open Heart Surgery;  Laterality: N/A;   CATARACT EXTRACTION   4 YRS AGO   BOTH EYES   CHOLECYSTECTOMY  07/21/2011   Procedure: LAPAROSCOPIC CHOLECYSTECTOMY WITH INTRAOPERATIVE CHOLANGIOGRAM;  Surgeon: Kandis Cocking, MD;  Location: [facility];  Service: General;  Laterality: N/A;   CORONARY ANGIOPLASTY  2012   2 stents   coronary stenting     s/p PCI of the RCA by Dr Excell Seltzer 8/12 with 2 promus stents   CYSTOSCOPY W/ RETROGRADES Bilateral 12/13/2019   Procedure: CYSTOSCOPY WITH RETROGRADE PYELOGRAM;  Surgeon: Sebastian Ache, MD;  Location: [facility];  Service: Urology;  Laterality: Bilateral;   CYSTOSCOPY W/ RETROGRADES Bilateral 10/14/2020   Procedure: CYSTOSCOPY WITH RETROGRADE PYELOGRAM;  Surgeon: Sebastian Ache, MD;  Location: [facility];  Service: Urology;  Laterality: Bilateral;   CYSTOSCOPY W/ RETROGRADES Bilateral 12/16/2020   Procedure: CYSTOSCOPY WITH RETROGRADE PYELOGRAM;  Surgeon: Sebastian Ache, MD;  Location: [facility];  Service: Urology;  Laterality: Bilateral;   CYSTOSCOPY W/ RETROGRADES Bilateral 06/03/2022   Procedure: CYSTOSCOPY WITH RETROGRADE PYELOGRAM;  Surgeon: Sebastian Ache, MD;  Location: [facility];  Service: Urology;  Laterality: Bilateral;   CYSTOSCOPY W/ RETROGRADES Bilateral 12/28/2022   Procedure: CYSTOSCOPY WITH RETROGRADE PYELOGRAM, FULGARATION OF BLEEDERS;  Surgeon: Loletta Parish., MD;  Location: [facility];  Service: Urology;  Laterality: Bilateral;   CYSTOSCOPY WITH INJECTION N/A 03/03/2023   Procedure: CYSTOSCOPY WITH INDOCYANINE INJECTION;  Surgeon: Loletta Parish., MD;  Location: [facility];  Service: Urology;  Laterality: N/A;  360 MINUTES   IR IMAGING GUIDED PORT INSERTION  05/29/2023   LEFT AND RIGHT HEART CATHETERIZATION WITH CORONARY ANGIOGRAM N/A 06/23/2014   Procedure: LEFT AND RIGHT HEART CATHETERIZATION WITH CORONARY ANGIOGRAM;  Surgeon: Laurey Morale, MD;  Location: [facility] CATH LAB;  Service:  Cardiovascular;  Laterality: N/A;   NECK SURGERY  03/23/09   per Dr. Ophelia Charter, cervical fusion    PERICARDIOCENTESIS N/A 09/28/2021   Procedure: PERICARDIOCENTESIS;  Surgeon: Corky Crafts, MD;  Location: [facility] INVASIVE CV LAB;  Service: Cardiovascular;  Laterality: N/A;   REPLACEMENT ASCENDING AORTA N/A 09/16/2021   Procedure: REPLACEMENT ASCENDING AORTA WITH 30 X HEMASHIELD PLATINUM WOVEN DOUBLE VELOUR VASCULAR GRAFT;  Surgeon: Alleen Borne, MD;  Location: [facility] OR;  Service: Open Heart Surgery;  Laterality: N/A;  CIRC ARREST   right elbow surgery     RIGHT HEART CATH N/A 06/15/2023   Procedure: RIGHT HEART CATH;  Surgeon: Laurey Morale, MD;  Location: [facility] INVASIVE CV LAB;  Service: Cardiovascular;  Laterality: N/A;   RIGHT HEART CATH AND CORONARY ANGIOGRAPHY N/A 07/08/2021   Procedure: RIGHT HEART CATH AND CORONARY ANGIOGRAPHY;  Surgeon: Laurey Morale, MD;  Location: [facility] INVASIVE CV LAB;  Service: Cardiovascular;  Laterality: N/A;   ROBOT ASSISTED LAPAROSCOPIC COMPLETE CYSTECT ILEAL  CONDUIT N/A 03/03/2023   Procedure: XI ROBOTIC ASSISTED LAPAROSCOPIC COMPLETE CYSTECTECTOMY WITH  ILEAL CONDUIT DIVERSION;  Surgeon: Loletta Parish., MD;  Location: [facility];  Service: Urology;  Laterality: N/A;   ROBOT ASSISTED LAPAROSCOPIC RADICAL PROSTATECTOMY N/A 03/03/2023   Procedure: XI ROBOTIC ASSISTED LAPAROSCOPIC RADICAL PROSTATECTOMY WITH LYMPH NODE DISSECTION;  Surgeon: Loletta Parish., MD;  Location: [facility];  Service: Urology;  Laterality: N/A;   solonscopy  05/23/08   per Dr. Celene Kras hemorrhoids only, repeat in 5 years   SUBXYPHOID PERICARDIAL WINDOW N/A 09/28/2021   Procedure: SUBXYPHOID PERICARDIAL WINDOW;  Surgeon: Alleen Borne, MD;  Location: [facility] OR;  Service: Thoracic;  Laterality: N/A;   TEE WITHOUT CARDIOVERSION N/A 06/23/2014   Procedure: TRANSESOPHAGEAL ECHOCARDIOGRAM (TEE);  Surgeon: Laurey Morale, MD;  Location: [facility];  Service: Cardiovascular;  Laterality:  N/A;   TEE WITHOUT CARDIOVERSION N/A 01/21/2016   Procedure: TRANSESOPHAGEAL ECHOCARDIOGRAM (TEE);  Surgeon: Laurey Morale, MD;  Location: [facility] ENDOSCOPY;  Service: Cardiovascular;  Laterality: N/A;   TEE WITHOUT CARDIOVERSION N/A 09/16/2021   Procedure: TRANSESOPHAGEAL ECHOCARDIOGRAM (TEE);  Surgeon: Alleen Borne, MD;  Location: [facility] OR;  Service: Open Heart Surgery;  Laterality: N/A;   TEE WITHOUT CARDIOVERSION N/A 09/28/2021   Procedure: TRANSESOPHAGEAL ECHOCARDIOGRAM (TEE);  Surgeon: Alleen Borne, MD;  Location: [facility] OR;  Service: Thoracic;  Laterality: N/A;   TONSILLECTOMY     TRANSESOPHAGEAL ECHOCARDIOGRAM (CATH LAB) N/A 04/17/2023   Procedure: TRANSESOPHAGEAL ECHOCARDIOGRAM;  Surgeon: Jake Bathe, MD;  Location: [facility] INVASIVE CV LAB;  Service: Cardiovascular;  Laterality: N/A;   TRANSURETHRAL RESECTION OF BLADDER TUMOR N/A 10/21/2019   Procedure: TRANSURETHRAL RESECTION OF BLADDER TUMOR (TURBT);  Surgeon: Ihor Gully, MD;  Location: [facility];  Service: Urology;  Laterality: N/A;   TRANSURETHRAL RESECTION OF BLADDER TUMOR N/A 12/13/2019   Procedure: TRANSURETHRAL RESECTION OF BLADDER TUMOR (TURBT);  Surgeon: Sebastian Ache, MD;  Location: [facility];  Service: Urology;  Laterality: N/A;  1 HR   TRANSURETHRAL RESECTION OF BLADDER TUMOR N/A 10/14/2020   Procedure: TRANSURETHRAL RESECTION OF BLADDER TUMOR (TURBT);  Surgeon: Sebastian Ache, MD;  Location: [facility];  Service: Urology;  Laterality: N/A;   TRANSURETHRAL RESECTION OF BLADDER TUMOR N/A 12/16/2020   Procedure: RESTAGING TRANSURETHRAL RESECTION OF BLADDER TUMOR (TURBT);  Surgeon: Sebastian Ache, MD;  Location: [facility];  Service: Urology;  Laterality: N/A;   TRANSURETHRAL RESECTION OF BLADDER TUMOR N/A 06/03/2022   Procedure: TRANSURETHRAL RESECTION OF BLADDER TUMOR (TURBT);  Surgeon: Sebastian Ache, MD;  Location: [facility];  Service: Urology;  Laterality: N/A;    UMBILICAL HERNIA REPAIR  03/03/2023   Procedure: HERNIA REPAIR UMBILICAL;  Surgeon: Loletta Parish., MD;  Location: [facility];  Service: Urology;;    Social History:  reports that he quit smoking about 13 years ago. His smoking use included cigarettes. He started smoking about 53 years ago. He has a 80 pack-year smoking history. He has never been exposed to tobacco smoke. He has never used smokeless tobacco. He reports that he does not drink alcohol and does not use drugs.  No Known Allergies  Family History  Problem Relation Age of Onset   Lung cancer Mother        lung   Esophageal cancer Cousin    Colon cancer Neg Hx    Rectal cancer Neg Hx    Stomach cancer Neg Hx      Prior to Admission medications  Medication Sig Start Date End Date Taking? Authorizing Provider  acetaminophen (TYLENOL) 325 MG tablet Take 650 mg by mouth every 6 (six) hours as needed (for pain).    [provider]  amLODipine (NORVASC) 5 MG tablet Take 1 tablet (5 mg total) by mouth daily. 05/16/23   Jaci Standard, MD  aspirin EC 81 MG tablet Take 81 mg by mouth in the morning.    [provider]  carvedilol (COREG) 3.125 MG tablet TAKE 1 TABLET BY MOUTH TWICE A DAY WITH FOOD 05/10/23   Laurey Morale, MD  cyanocobalamin (VITAMIN B12) 1000 MCG tablet Take 1 tablet (1,000 mcg total) by mouth daily. 06/15/23   Jaci Standard, MD  diazepam (VALIUM) 5 MG tablet TAKE 1 TABLET BY MOUTH EVERY 12 HOURS AS NEEDED FOR ANXIETY. 03/29/23   Nelwyn Salisbury, MD  ferrous sulfate 325 (65 FE) MG tablet Take 325 mg by mouth daily with breakfast.    [provider]  furosemide (LASIX) 20 MG tablet Take 1 tablet (20 mg total) by mouth daily. 06/15/23   Laurey Morale, MD  ipratropium (ATROVENT) 0.03 % nasal spray Place 2 sprays into both nostrils every 12 (twelve) hours as needed for rhinitis.    [provider]  JARDIANCE 10 MG TABS tablet Take 1 tablet (10 mg total) by mouth  daily. Patient not taking: Reported on 06/20/2023 05/10/23   Laurey Morale, MD  lidocaine-prilocaine (EMLA) cream Apply 1 Application topically as needed. 05/16/23   Jaci Standard, MD  Magnesium 500 MG TABS Take 500 mg by mouth in the morning.    [provider]  meclizine (ANTIVERT) 25 MG tablet Take 1 tablet (25 mg total) by mouth 3 (three) times daily as needed for dizziness. 09/09/22   Nelwyn Salisbury, MD  multivitamin-iron-minerals-folic acid (CENTRUM) chewable tablet Chew 1 tablet by mouth daily. 09/23/21   Barrett, Erin R, PA-C  nitroGLYCERIN (NITROSTAT) 0.4 MG SL tablet Place 0.4 mg under the tongue every 5 (five) minutes as needed for chest pain. 11/03/17 12/05/25  [provider]  ondansetron (ZOFRAN) 8 MG tablet Take 1 tablet (8 mg total) by mouth every 8 (eight) hours as needed. 05/16/23   Jaci Standard, MD  potassium chloride SA (KLOR-CON M20) 20 MEQ tablet Take 1 tablet (20 mEq total) by mouth daily. 05/26/23   Milford, Anderson Malta, FNP  prochlorperazine (COMPAZINE) 10 MG tablet Take 1 tablet (10 mg total) by mouth every 6 (six) hours as needed for nausea or vomiting. 05/16/23   Jaci Standard, MD  rosuvastatin (CRESTOR) 20 MG tablet TAKE 1 TABLET BY MOUTH EVERYDAY AT BEDTIME 02/06/23   Milford, Anderson Malta, FNP  venlafaxine XR (EFFEXOR-XR) 150 MG 24 hr capsule TAKE 1 CAPSULE BY MOUTH DAILY WITH BREAKFAST. 05/11/23   Nelwyn Salisbury, MD    Physical Exam: Vitals:   06/26/23 2045 06/26/23 2100 06/26/23 2201 06/26/23 2315  BP: (!) 141/63  134/70 128/61  Pulse: 72 69 62 (!) 59  Resp:   (!) 23 (!) 21  Temp:   (!) 100.7 F (38.2 C)   TempSrc:   Oral   SpO2: 99% 98% 95% 97%   Constitutional: Sitting up in bed, NAD, calm, comfortable Eyes: EOMI, lids and conjunctivae normal ENMT: Mucous membranes are moist. Posterior pharynx clear of any exudate or lesions.Normal dentition.  Neck: normal, supple, no masses. Respiratory: clear to auscultation bilaterally, no wheezing,  no crackles. Normal respiratory  effort. No accessory muscle use.  Cardiovascular: Regular rate and rhythm, systolic murmur with valvular click.  Trace bilateral lower extremity edema. 2+ pedal pulses.  Port right upper chest. Abdomen: Urostomy in place right abdomen.  No tenderness, no masses palpated.  Musculoskeletal: no clubbing / cyanosis. No joint deformity upper and lower extremities. Good ROM, no contractures. Normal muscle tone.  Skin: no rashes, lesions, ulcers. No induration Neurologic: Sensation intact. Strength 5/5 in all 4.  Psychiatric: Normal judgment and insight. Alert and oriented x 3. Normal mood.   EKG: Personally reviewed. Sinus rhythm, rate 71, no acute ischemic changes.  Assessment/Plan Principal Problem:   SIRS (systemic inflammatory response syndrome) (HCC) Active Problems:   Acute on chronic anemia   Renal dysfunction   CAD (coronary artery disease)   Hyperlipidemia   OSA (obstructive sleep apnea)   Chronic diastolic CHF (congestive heart failure) (HCC)   Essential hypertension   Bladder cancer (HCC)   History of aortic valve replacement with tissue graft   Luis [name] is a 68 y.o. male with medical history significant for high-grade bladder cancer s/p cystoprostatectomy with urostomy in place receiving radiation treatment, chronic HFpEF, CAD s/p PCI to RCA, severe AS s/p bioprosthetic AVR and ascending aorta replacement, iron deficiency anemia, HTN, HLD, depression/anxiety, OSA who was admitted with fever/SIRS and acute on chronic anemia.  Assessment and Plan: Fever/SIRS: Patient presenting with fever and leukocytosis.  History of Enterococcus faecalis bacteremia in December.  Currently no obvious infectious source.  COVID, flu, RSV negative.  CXR without evidence of pneumonia. -Started on Unasyn in the ED, continue for now and narrow/adjust based on clinical progress and further cultural data -Obtain UA, follow blood cultures -Check respiratory pathogen  panel  Acute on chronic iron deficiency anemia: Hemoglobin 5.8 on admission compared to 7.5 on 2/20.  Patient denies any obvious bleeding.  He has seen some dark tarry stools over the last 2 weeks but this may be related to iron supplement.  He is on low-dose aspirin, no other blood thinners. -Transfusing 2 unit PRBCs -Follow anemia panel, repeat CBC in a.m. -[facility] to see in a.m.  Renal dysfunction: Creatinine 2.26 on admission, renal dysfunction ongoing for the last 3 months with a eGFR 30-35 range during this time.  Could be related to his bladder cancer or possibly cardiorenal process.  Continue to monitor.  High-grade bladder cancer s/p total cystectomy with ileal conduit and prostatectomy: Follows with oncology Dr. [name] and on active radiation treatment with Dr. Kathrynn Running.  Chemotherapy has been delayed due to renal dysfunction.  Chronic HFpEF: Euvolemic on admission. -Continue Coreg 3.125 mg twice daily -Continue oral Lasix 20 mg daily  Coronary artery disease s/p PCI to RCA 2012: Stable, denies chest pain.  Holding aspirin as above.  Continue Coreg and rosuvastatin.  Hypertension: Continue amlodipine and Coreg.  Hyperlipidemia: Continue rosuvastatin.  OSA on CPAP: Continue CPAP nightly.  S/p bioprosthetic AVR and ascending aorta replacement   DVT prophylaxis: SCDs Start: 06/27/23 0055 Code Status: Full code, confirmed with patient on admission Family Communication: Spouse at bedside Disposition Plan: From home, dispo pending clinical progress Consults called: [facility] Severity of Illness: The appropriate patient status for this patient is INPATIENT. Inpatient status is judged to be reasonable and necessary in order to provide the required intensity of service to ensure the patient's safety. The patient's presenting symptoms, physical exam findings, and initial radiographic and laboratory data in the context of their chronic comorbidities is felt to place them at  high risk for further clinical deterioration. Furthermore, it is not anticipated that the patient will be medically stable for discharge from the hospital within 2 midnights of admission.   * I certify that at the point of admission it is my clinical judgment that the patient will require inpatient hospital care spanning beyond 2 midnights from the point of admission due to high intensity of service, high risk for further deterioration and high frequency of surveillance required.Darreld Mclean MD [facility]  If 7PM-7AM, please contact night-coverage [email or web address]  06/27/2023, 1:14 AM

## 2023-06-26 NOTE — ED Triage Notes (Signed)
 Pt with hx of bladder ca , bladder removed in November, septic in December. Pt undergoing tx , restarted last week. Pt weak, not eating , running fever at night , wife reports he was like this last time right before he became septic , wife wants to get ahead of it this time

## 2023-06-27 ENCOUNTER — Ambulatory Visit: Payer: PPO

## 2023-06-27 ENCOUNTER — Encounter (HOSPITAL_COMMUNITY): Payer: PPO

## 2023-06-27 ENCOUNTER — Inpatient Hospital Stay (HOSPITAL_COMMUNITY)

## 2023-06-27 DIAGNOSIS — F419 Anxiety disorder, unspecified: Secondary | ICD-10-CM | POA: Diagnosis not present

## 2023-06-27 DIAGNOSIS — K259 Gastric ulcer, unspecified as acute or chronic, without hemorrhage or perforation: Secondary | ICD-10-CM | POA: Diagnosis not present

## 2023-06-27 DIAGNOSIS — I110 Hypertensive heart disease with heart failure: Secondary | ICD-10-CM | POA: Diagnosis not present

## 2023-06-27 DIAGNOSIS — C678 Malignant neoplasm of overlapping sites of bladder: Secondary | ICD-10-CM | POA: Diagnosis not present

## 2023-06-27 DIAGNOSIS — E785 Hyperlipidemia, unspecified: Secondary | ICD-10-CM | POA: Diagnosis not present

## 2023-06-27 DIAGNOSIS — B961 Klebsiella pneumoniae [K. pneumoniae] as the cause of diseases classified elsewhere: Secondary | ICD-10-CM | POA: Diagnosis not present

## 2023-06-27 DIAGNOSIS — F32A Depression, unspecified: Secondary | ICD-10-CM | POA: Diagnosis not present

## 2023-06-27 DIAGNOSIS — Z510 Encounter for antineoplastic radiation therapy: Secondary | ICD-10-CM | POA: Diagnosis not present

## 2023-06-27 DIAGNOSIS — D649 Anemia, unspecified: Secondary | ICD-10-CM | POA: Diagnosis present

## 2023-06-27 DIAGNOSIS — N136 Pyonephrosis: Secondary | ICD-10-CM | POA: Diagnosis not present

## 2023-06-27 DIAGNOSIS — R7881 Bacteremia: Secondary | ICD-10-CM | POA: Diagnosis not present

## 2023-06-27 DIAGNOSIS — K254 Chronic or unspecified gastric ulcer with hemorrhage: Secondary | ICD-10-CM | POA: Diagnosis present

## 2023-06-27 DIAGNOSIS — I129 Hypertensive chronic kidney disease with stage 1 through stage 4 chronic kidney disease, or unspecified chronic kidney disease: Secondary | ICD-10-CM | POA: Diagnosis not present

## 2023-06-27 DIAGNOSIS — Z906 Acquired absence of other parts of urinary tract: Secondary | ICD-10-CM | POA: Diagnosis not present

## 2023-06-27 DIAGNOSIS — K3189 Other diseases of stomach and duodenum: Secondary | ICD-10-CM | POA: Diagnosis not present

## 2023-06-27 DIAGNOSIS — I4891 Unspecified atrial fibrillation: Secondary | ICD-10-CM | POA: Diagnosis not present

## 2023-06-27 DIAGNOSIS — N1832 Chronic kidney disease, stage 3b: Secondary | ICD-10-CM | POA: Diagnosis not present

## 2023-06-27 DIAGNOSIS — D509 Iron deficiency anemia, unspecified: Secondary | ICD-10-CM | POA: Diagnosis not present

## 2023-06-27 DIAGNOSIS — C679 Malignant neoplasm of bladder, unspecified: Secondary | ICD-10-CM | POA: Diagnosis not present

## 2023-06-27 DIAGNOSIS — B348 Other viral infections of unspecified site: Secondary | ICD-10-CM | POA: Diagnosis not present

## 2023-06-27 DIAGNOSIS — I130 Hypertensive heart and chronic kidney disease with heart failure and stage 1 through stage 4 chronic kidney disease, or unspecified chronic kidney disease: Secondary | ICD-10-CM | POA: Diagnosis not present

## 2023-06-27 DIAGNOSIS — E669 Obesity, unspecified: Secondary | ICD-10-CM | POA: Diagnosis not present

## 2023-06-27 DIAGNOSIS — B9789 Other viral agents as the cause of diseases classified elsewhere: Secondary | ICD-10-CM | POA: Diagnosis not present

## 2023-06-27 DIAGNOSIS — R6520 Severe sepsis without septic shock: Secondary | ICD-10-CM | POA: Diagnosis not present

## 2023-06-27 DIAGNOSIS — R6510 Systemic inflammatory response syndrome (SIRS) of non-infectious origin without acute organ dysfunction: Secondary | ICD-10-CM | POA: Diagnosis present

## 2023-06-27 DIAGNOSIS — D72829 Elevated white blood cell count, unspecified: Secondary | ICD-10-CM

## 2023-06-27 DIAGNOSIS — I252 Old myocardial infarction: Secondary | ICD-10-CM | POA: Diagnosis not present

## 2023-06-27 DIAGNOSIS — N289 Disorder of kidney and ureter, unspecified: Secondary | ICD-10-CM

## 2023-06-27 DIAGNOSIS — N179 Acute kidney failure, unspecified: Secondary | ICD-10-CM | POA: Diagnosis not present

## 2023-06-27 DIAGNOSIS — K921 Melena: Secondary | ICD-10-CM | POA: Diagnosis present

## 2023-06-27 DIAGNOSIS — Z1152 Encounter for screening for COVID-19: Secondary | ICD-10-CM | POA: Diagnosis not present

## 2023-06-27 DIAGNOSIS — G4733 Obstructive sleep apnea (adult) (pediatric): Secondary | ICD-10-CM | POA: Diagnosis not present

## 2023-06-27 DIAGNOSIS — G2581 Restless legs syndrome: Secondary | ICD-10-CM | POA: Diagnosis not present

## 2023-06-27 DIAGNOSIS — D696 Thrombocytopenia, unspecified: Secondary | ICD-10-CM | POA: Diagnosis not present

## 2023-06-27 DIAGNOSIS — A4159 Other Gram-negative sepsis: Secondary | ICD-10-CM | POA: Diagnosis not present

## 2023-06-27 DIAGNOSIS — Z953 Presence of xenogenic heart valve: Secondary | ICD-10-CM | POA: Diagnosis not present

## 2023-06-27 DIAGNOSIS — E871 Hypo-osmolality and hyponatremia: Secondary | ICD-10-CM | POA: Diagnosis not present

## 2023-06-27 DIAGNOSIS — I5032 Chronic diastolic (congestive) heart failure: Secondary | ICD-10-CM | POA: Diagnosis not present

## 2023-06-27 DIAGNOSIS — I2510 Atherosclerotic heart disease of native coronary artery without angina pectoris: Secondary | ICD-10-CM | POA: Diagnosis not present

## 2023-06-27 LAB — RESPIRATORY PANEL BY PCR

## 2023-06-27 LAB — RETICULOCYTES
Immature Retic Fract: 25.4 % — ABNORMAL HIGH (ref 2.3–15.9)
RBC.: 2.39 MIL/uL — ABNORMAL LOW (ref 4.22–5.81)
Retic Count, Absolute: 49 10*3/uL (ref 19.0–186.0)
Retic Ct Pct: 2.1 % (ref 0.4–3.1)

## 2023-06-27 LAB — BLOOD CULTURE ID PANEL (REFLEXED) - BCID2

## 2023-06-27 LAB — URINALYSIS, W/ REFLEX TO CULTURE (INFECTION SUSPECTED)
Bilirubin Urine: NEGATIVE
Glucose, UA: NEGATIVE mg/dL
Ketones, ur: NEGATIVE mg/dL
Nitrite: NEGATIVE
Protein, ur: NEGATIVE mg/dL
Specific Gravity, Urine: 1.006 (ref 1.005–1.030)
pH: 5 (ref 5.0–8.0)

## 2023-06-27 LAB — COMPREHENSIVE METABOLIC PANEL
ALT: 29 U/L (ref 0–44)
AST: 29 U/L (ref 15–41)
Albumin: 2.5 g/dL — ABNORMAL LOW (ref 3.5–5.0)
Alkaline Phosphatase: 100 U/L (ref 38–126)
Anion gap: 9 (ref 5–15)
BUN: 32 mg/dL — ABNORMAL HIGH (ref 8–23)
CO2: 20 mmol/L — ABNORMAL LOW (ref 22–32)
Calcium: 7.9 mg/dL — ABNORMAL LOW (ref 8.9–10.3)
Chloride: 105 mmol/L (ref 98–111)
Creatinine, Ser: 2.59 mg/dL — ABNORMAL HIGH (ref 0.61–1.24)
GFR, Estimated: 26 mL/min — ABNORMAL LOW (ref >60)
Glucose, Bld: 162 mg/dL — ABNORMAL HIGH (ref 70–99)
Potassium: 4.4 mmol/L (ref 3.5–5.1)
Sodium: 134 mmol/L — ABNORMAL LOW (ref 135–145)
Total Bilirubin: 1.2 mg/dL (ref 0.0–1.2)
Total Protein: 7.4 g/dL (ref 6.5–8.1)

## 2023-06-27 LAB — PREPARE RBC (CROSSMATCH)

## 2023-06-27 LAB — I-STAT CG4 LACTIC ACID, ED: Lactic Acid, Venous: 0.4 mmol/L — ABNORMAL LOW (ref 0.5–1.9)

## 2023-06-27 LAB — FERRITIN: Ferritin: 570 ng/mL — ABNORMAL HIGH (ref 24–336)

## 2023-06-27 LAB — CBC
HCT: 25.3 % — ABNORMAL LOW (ref 39.0–52.0)
Hemoglobin: 8.1 g/dL — ABNORMAL LOW (ref 13.0–17.0)
MCH: 28 pg (ref 26.0–34.0)
MCHC: 32 g/dL (ref 30.0–36.0)
MCV: 87.5 fL (ref 80.0–100.0)
Platelets: 181 10*3/uL (ref 150–400)
RBC: 2.89 MIL/uL — ABNORMAL LOW (ref 4.22–5.81)
RDW: 16.3 % — ABNORMAL HIGH (ref 11.5–15.5)
WBC: 9 10*3/uL (ref 4.0–10.5)
nRBC: 0 % (ref 0.0–0.2)

## 2023-06-27 LAB — IRON AND TIBC
Iron: 21 ug/dL — ABNORMAL LOW (ref 45–182)
Saturation Ratios: 10 % — ABNORMAL LOW (ref 17.9–39.5)
TIBC: 202 ug/dL — ABNORMAL LOW (ref 250–450)
UIBC: 181 ug/dL

## 2023-06-27 LAB — FOLATE: Folate: 20.4 ng/mL (ref >5.9)

## 2023-06-27 LAB — VITAMIN B12: Vitamin B-12: 294 pg/mL (ref 180–914)

## 2023-06-27 MED ORDER — ACETAMINOPHEN 650 MG RE SUPP: 650.0000 mg | Freq: Four times a day (QID) | RECTAL | Status: DC | PRN

## 2023-06-27 MED ORDER — AMLODIPINE BESYLATE 5 MG PO TABS
5.0000 mg | ORAL_TABLET | Freq: Every day | ORAL | Status: DC
  Administered 2023-06-27 – 2023-07-03 (×7): 5 mg via ORAL
  Filled 2023-06-27 (×7): qty 1

## 2023-06-27 MED ORDER — SENNOSIDES-DOCUSATE SODIUM 8.6-50 MG PO TABS: 1.0000 | ORAL_TABLET | Freq: Every evening | ORAL | Status: DC | PRN

## 2023-06-27 MED ORDER — POTASSIUM CHLORIDE CRYS ER 20 MEQ PO TBCR
20.0000 meq | EXTENDED_RELEASE_TABLET | Freq: Every day | ORAL | Status: DC
  Administered 2023-06-27 – 2023-07-03 (×7): 20 meq via ORAL
  Filled 2023-06-27 (×7): qty 1

## 2023-06-27 MED ORDER — SODIUM CHLORIDE 0.9 % IV SOLN: 2.0000 g | Freq: Every day | INTRAVENOUS | Status: DC

## 2023-06-27 MED ORDER — VENLAFAXINE HCL ER 150 MG PO CP24
150.0000 mg | ORAL_CAPSULE | Freq: Every day | ORAL | Status: DC
  Administered 2023-06-27 – 2023-07-03 (×7): 150 mg via ORAL
  Filled 2023-06-27: qty 2
  Filled 2023-06-27 (×6): qty 1

## 2023-06-27 MED ORDER — CARVEDILOL 3.125 MG PO TABS
3.1250 mg | ORAL_TABLET | Freq: Two times a day (BID) | ORAL | Status: DC
  Administered 2023-06-27 – 2023-07-03 (×9): 3.125 mg via ORAL
  Filled 2023-06-27 (×14): qty 1

## 2023-06-27 MED ORDER — SODIUM CHLORIDE 0.9% FLUSH
3.0000 mL | Freq: Two times a day (BID) | INTRAVENOUS | Status: DC
  Administered 2023-06-27 – 2023-07-03 (×11): 3 mL via INTRAVENOUS

## 2023-06-27 MED ORDER — FUROSEMIDE 40 MG PO TABS
20.0000 mg | ORAL_TABLET | Freq: Every day | ORAL | Status: DC
  Filled 2023-06-27: qty 1

## 2023-06-27 MED ORDER — DIAZEPAM 5 MG PO TABS
5.0000 mg | ORAL_TABLET | Freq: Two times a day (BID) | ORAL | Status: DC | PRN
  Administered 2023-07-01: 5 mg via ORAL
  Filled 2023-06-27: qty 1

## 2023-06-27 MED ORDER — ONDANSETRON HCL 4 MG/2ML IJ SOLN
4.0000 mg | Freq: Four times a day (QID) | INTRAMUSCULAR | Status: DC | PRN
  Administered 2023-06-30 – 2023-07-02 (×2): 4 mg via INTRAVENOUS
  Filled 2023-06-27 (×2): qty 2

## 2023-06-27 MED ORDER — ROSUVASTATIN CALCIUM 10 MG PO TABS
20.0000 mg | ORAL_TABLET | Freq: Every day | ORAL | Status: DC
  Administered 2023-06-27 – 2023-07-01 (×5): 20 mg via ORAL
  Filled 2023-06-27 (×5): qty 2

## 2023-06-27 MED ORDER — SODIUM CHLORIDE 0.9 % IV SOLN
2.0000 g | Freq: Every day | INTRAVENOUS | Status: DC
  Administered 2023-06-27 – 2023-06-28 (×2): 2 g via INTRAVENOUS
  Filled 2023-06-27 (×2): qty 20

## 2023-06-27 MED ORDER — ONDANSETRON HCL 4 MG PO TABS: 4.0000 mg | ORAL_TABLET | Freq: Four times a day (QID) | ORAL | Status: DC | PRN

## 2023-06-27 MED ORDER — ACETAMINOPHEN 325 MG PO TABS
650.0000 mg | ORAL_TABLET | Freq: Four times a day (QID) | ORAL | Status: DC | PRN
  Administered 2023-07-01 (×2): 650 mg via ORAL
  Filled 2023-06-27 (×3): qty 2

## 2023-06-27 NOTE — Plan of Care (Signed)

## 2023-06-27 NOTE — Progress Notes (Signed)
 PHARMACY - PHYSICIAN COMMUNICATION CRITICAL VALUE ALERT - BLOOD CULTURE IDENTIFICATION (BCID)  Luis [name] is an 68 y.o. male who presented to [facility] on 06/26/2023 with [name] chief complaint of sepsis/fevers of unknown origin.  Assessment:  1/4 BCx bottles growing Klebsiella oxytoca (source not obvious, but urinary seems likely given Hx bladder cancer s/p ileal conduit; does have bioprosthetic AVR, but IE seems unlikely with GNR involvement)  Name of physician (or Provider) Contacted: Gherghe  Current antibiotics: Unasyn  Changes to prescribed antibiotics recommended: Chg to Rocephin per BCID algorithm (systemwide antibiogram shows 71% susceptibility of K oxytoca to Unasyn) Recommendations accepted by provider  Results for orders placed or performed during the hospital encounter of 06/26/23  Blood Culture ID Panel (Reflexed) (Collected: 06/26/2023  9:00 PM)  Result Value Ref Range   Enterococcus faecalis NOT DETECTED NOT DETECTED   Enterococcus Faecium NOT DETECTED NOT DETECTED   Listeria monocytogenes NOT DETECTED NOT DETECTED   Staphylococcus species NOT DETECTED NOT DETECTED   Staphylococcus aureus (BCID) NOT DETECTED NOT DETECTED   Staphylococcus epidermidis NOT DETECTED NOT DETECTED   Staphylococcus lugdunensis NOT DETECTED NOT DETECTED   Streptococcus species NOT DETECTED NOT DETECTED   Streptococcus agalactiae NOT DETECTED NOT DETECTED   Streptococcus pneumoniae NOT DETECTED NOT DETECTED   Streptococcus pyogenes NOT DETECTED NOT DETECTED   [name].calcoaceticus-baumannii NOT DETECTED NOT DETECTED   Bacteroides fragilis NOT DETECTED NOT DETECTED   Enterobacterales DETECTED ([name]) NOT DETECTED   Enterobacter cloacae complex NOT DETECTED NOT DETECTED   Escherichia coli NOT DETECTED NOT DETECTED   Klebsiella aerogenes NOT DETECTED NOT DETECTED   Klebsiella oxytoca DETECTED ([name]) NOT DETECTED   Klebsiella pneumoniae NOT DETECTED NOT DETECTED   Proteus species NOT DETECTED NOT DETECTED    Salmonella species NOT DETECTED NOT DETECTED   Serratia marcescens NOT DETECTED NOT DETECTED   Haemophilus influenzae NOT DETECTED NOT DETECTED   Neisseria meningitidis NOT DETECTED NOT DETECTED   Pseudomonas aeruginosa NOT DETECTED NOT DETECTED   Stenotrophomonas maltophilia NOT DETECTED NOT DETECTED   Candida albicans NOT DETECTED NOT DETECTED   Candida auris NOT DETECTED NOT DETECTED   Candida glabrata NOT DETECTED NOT DETECTED   Candida krusei NOT DETECTED NOT DETECTED   Candida parapsilosis NOT DETECTED NOT DETECTED   Candida tropicalis NOT DETECTED NOT DETECTED   Cryptococcus neoformans/gattii NOT DETECTED NOT DETECTED   CTX-M ESBL NOT DETECTED NOT DETECTED   Carbapenem resistance IMP NOT DETECTED NOT DETECTED   Carbapenem resistance KPC NOT DETECTED NOT DETECTED   Carbapenem resistance NDM NOT DETECTED NOT DETECTED   Carbapenem resist OXA 48 LIKE NOT DETECTED NOT DETECTED   Carbapenem resistance VIM NOT DETECTED NOT DETECTED    Luis [name] 06/27/2023  4:14 PM

## 2023-06-27 NOTE — Progress Notes (Signed)
 PROGRESS NOTE  Luis [name] ZOX:[id number] DOB: June 20, 1955 DOA: 06/26/2023 PCP: Nelwyn Salisbury, MD   LOS: 0 days   Brief Narrative / Interim history: 68 year old male with history of high-grade bladder cancer status post cystoprostatectomy with urostomy in place, currently undergoing radiation therapy started a week prior to admission, chronic diastolic CHF, CAD with PCI to RCA, severe AAS status post bioprosthetic AVR and ascending aorta replacement, HTN, HLD, OSA on CPAP who comes into the hospital with fevers over the last 3 to 4 days.  Patient has a recent history of septic shock due to Enterococcus bacteremia in December 2024, completed several weeks of IV ampicillin, and at that time there was no evidence of TEE and no infection of his aortic graft on the CT scan of the chest.  Current symptoms remind him of his prior episode and he decided to seek care.  In the ED he was febrile, but importantly was also found to be anemic with a hemoglobin in the 5 range.  Patient is reported dark stools intermittently over the last couple of weeks  Subjective / 24h Interval events: Doing a little bit better this morning, he is afebrile and feels a little bit stronger.  No abdominal pain, no nausea or vomiting.  Assesement and Plan: Principal Problem:   SIRS (systemic inflammatory response syndrome) (HCC) Active Problems:   Acute on chronic anemia   Renal dysfunction   CAD (coronary artery disease)   Hyperlipidemia   OSA (obstructive sleep apnea)   Chronic diastolic CHF (congestive heart failure) (HCC)   Essential hypertension   Bladder cancer (HCC)   History of aortic valve replacement with tissue graft  Principal problem Fever/SIRS -patient came into the hospital and has been admitted with fever and elevated white count.  He has a history of enterococcal bacteremia back in December.  COVID, flu, RSV all negative, chest x-ray on admission without acute findings or infiltrates -Has been  empirically placed on Unasyn, continue given recent history -Continue to closely monitor with low threshold to broaden antibiotics, but for now he appears stable and improved  Active problems Anemia, concern for melena -patient reports several weeks of intermittent melena, currently he is without frank bleeding and fecal occult is negative.  He is actively transfused on my evaluation -Gastroenterology consulted, appreciate input.  High-grade bladder cancer-status post total cystectomy with ileal conduit as well as prostatectomy-follows with oncology Dr. [name], recently started radiation therapy a week prior to this admission and has completed 6 cycles.  Acute kidney injury on CKD stage IV-baseline creatinine between 2 and 2.3, on admission it was 2.7 in the setting of febrile illness.  Creatinine improving now -He developed CKD following his last hospitalization, is scheduled to see [facility] as an outpatient  Hyponatremia -monitor, in the setting of CKD, Lasix  Chronic diastolic CHF-continue Coreg, hold Lasix for now as he is febrile and normotensive  Hyperlipidemia-continue Crestor  Essential hypertension-continue amlodipine and carvedilol, hold Lasix as above  CAD-with history of PCI to RCA, stable, denies chest pain.  Holding aspirin with his anemia.  Continue statin  OSA on CPAP-continue CPAP  Status post bioprosthetic AVR, ascending aorta replacement -noted, will watch blood cultures and repeat imaging if he is bacteremic again  Scheduled Meds:  sodium chloride   Intravenous Once   amLODipine  5 mg Oral Daily   carvedilol  3.125 mg Oral BID WC   potassium chloride SA  20 mEq Oral Daily   rosuvastatin  20 mg Oral  QHS   sodium chloride flush  3 mL Intravenous Q12H   venlafaxine XR  150 mg Oral Q breakfast   Continuous Infusions:  ampicillin-sulbactam (UNASYN) IV Stopped (06/27/23 0847)   PRN Meds:.acetaminophen **OR** acetaminophen, diazepam, ondansetron **OR**  ondansetron (ZOFRAN) IV, senna-docusate  Current Outpatient Medications  Medication Instructions   acetaminophen (TYLENOL) 650 mg, Every 6 hours PRN   amLODipine (NORVASC) 5 mg, Oral, Daily   aspirin EC 81 mg, Every morning   carvedilol (COREG) 3.125 MG tablet TAKE 1 TABLET BY MOUTH TWICE A DAY WITH FOOD   cyanocobalamin (VITAMIN B12) 1,000 mcg, Oral, Daily   diazepam (VALIUM) 5 mg, Oral, Every 12 hours PRN, for anxiety   furosemide (LASIX) 20 mg, Oral, Daily   ipratropium (ATROVENT) 0.03 % nasal spray 2 sprays, Every 12 hours PRN   Jardiance 10 mg, Oral, Daily   lidocaine-prilocaine (EMLA) cream 1 Application, Topical, As needed   Magnesium 500 mg, Every morning   meclizine (ANTIVERT) 25 mg, Oral, 3 times daily PRN   multivitamin-iron-minerals-folic acid (CENTRUM) chewable tablet 1 tablet, Oral, Daily   nitroGLYCERIN (NITROSTAT) 0.4 mg, Every 5 min PRN   ondansetron (ZOFRAN) 8 mg, Oral, Every 8 hours PRN   potassium chloride SA (KLOR-CON M20) 20 MEQ tablet 20 mEq, Oral, Daily   prochlorperazine (COMPAZINE) 10 mg, Oral, Every 6 hours PRN   rosuvastatin (CRESTOR) 20 MG tablet TAKE 1 TABLET BY MOUTH EVERYDAY AT BEDTIME   venlafaxine XR (EFFEXOR-XR) 150 mg, Oral, Daily with breakfast    Diet Orders (From admission, onward)     Start     Ordered   06/27/23 0055  Diet Heart Room service appropriate? Yes; Fluid consistency: Thin  Diet effective now       Question Answer Comment  Room service appropriate? Yes   Fluid consistency: Thin      06/27/23 0055            DVT prophylaxis: SCDs Start: 06/27/23 0055   Lab Results  Component Value Date   PLT 252 06/26/2023      Code Status: Full Code  Family Communication: Wife present at bedside  Status is: Inpatient Remains inpatient appropriate because: Severity of illness   Level of care: Telemetry  Consultants:  Gastroenterology  Objective: Vitals:   06/27/23 0609 06/27/23 0621 06/27/23 0715 06/27/23 1000  BP:  120/76 125/65 (!) 115/43 137/77  Pulse: 62 61 66 (!) 59  Resp: 20 20 (!) 21 (!) 23  Temp: 99.3 F (37.4 C) 99.5 F (37.5 C)    TempSrc: Oral Oral    SpO2: 98%  98% 100%  Weight:      Height:        Intake/Output Summary (Last 24 hours) at 06/27/2023 1044 Last data filed at 06/27/2023 0857 Gross per 24 hour  Intake 782 ml  Output --  Net 782 ml   Wt Readings from Last 3 Encounters:  06/27/23 112 kg  06/20/23 116.9 kg  06/15/23 114.8 kg    Examination:  Constitutional: NAD Eyes: no scleral icterus ENMT: Mucous membranes are moist.  Neck: normal, supple Respiratory: clear to auscultation bilaterally, no wheezing, no crackles. Normal respiratory effort. No accessory muscle use.  Cardiovascular: Regular rate and rhythm, no murmurs / rubs / gallops. No LE edema.  Abdomen: non distended, no tenderness. Bowel sounds positive.  Musculoskeletal: no clubbing / cyanosis.   Data Reviewed: I have independently reviewed following labs and imaging studies   CBC Recent Labs  Lab 06/26/23 2054 06/26/23  2100  WBC  --  12.3*  HGB 8.2* 5.8*  HCT 24.0* 18.0*  PLT  --  252  MCV  --  89.1  MCH  --  28.7  MCHC  --  32.2  RDW  --  16.4*  LYMPHSABS  --  0.5*  MONOABS  --  0.6  EOSABS  --  0.1  BASOSABS  --  0.0    Recent Labs  Lab 06/26/23 2053 06/26/23 2054 06/26/23 2100 06/27/23 0120  NA  --  133* 129*  --   K  --  5.9* 4.4  --   CL  --  106 101  --   CO2  --   --  18*  --   GLUCOSE  --  172* 191*  --   BUN  --  40* 30*  --   CREATININE  --  2.70* 2.26*  --   CALCIUM  --   --  8.0*  --   AST  --   --  32  --   ALT  --   --  27  --   ALKPHOS  --   --  101  --   BILITOT  --   --  0.8  --   ALBUMIN  --   --  2.8*  --   LATICACIDVEN 2.1*  --   --  0.4*  INR  --   --  1.3*  --     ------------------------------------------------------------------------------------------------------------------ No results for input(s): "CHOL", "HDL", "LDLCALC", "TRIG", "CHOLHDL",  "LDLDIRECT" in the last 72 hours.  Lab Results  Component Value Date   HGBA1C 5.9 (H) 05/30/2022   ------------------------------------------------------------------------------------------------------------------ No results for input(s): "TSH", "T4TOTAL", "T3FREE", "THYROIDAB" in the last 72 hours.  Invalid input(s): "FREET3"  Cardiac Enzymes No results for input(s): "CKMB", "TROPONINI", "MYOGLOBIN" in the last 168 hours.  Invalid input(s): "CK" ------------------------------------------------------------------------------------------------------------------    Component Value Date/Time   BNP 96.0 06/20/2023 1012   BNP 55.6 11/27/2015 1507    CBG: No results for input(s): "GLUCAP" in the last 168 hours.  Recent Results (from the past 240 hours)  Resp panel by RT-PCR (RSV, Flu A&B, Covid)     Status: None   Collection Time: 06/26/23  9:00 PM   Specimen: Nasal Swab  Result Value Ref Range Status   SARS Coronavirus 2 by RT PCR NEGATIVE NEGATIVE Final    Comment: (NOTE) SARS-CoV-2 target nucleic acids are NOT DETECTED.  The SARS-CoV-2 RNA is generally detectable in upper respiratory specimens during the acute phase of infection. The lowest concentration of SARS-CoV-2 viral copies this assay can detect is 138 copies/mL. A negative result does not preclude SARS-Cov-2 infection and should not be used as the sole basis for treatment or other patient management decisions. A negative result may occur with  improper specimen collection/handling, submission of specimen other than nasopharyngeal swab, presence of viral mutation(s) within the areas targeted by this assay, and inadequate number of viral copies(<138 copies/mL). A negative result must be combined with clinical observations, patient history, and epidemiological information. The expected result is Negative.  Fact Sheet for Patients:  BloggerCourse.com  Fact Sheet for Healthcare Providers:   SeriousBroker.it  This test is no t yet approved or cleared by the [place] FDA and  has been authorized for detection and/or diagnosis of SARS-CoV-2 by FDA under an Emergency Use Authorization (EUA). This EUA will remain  in effect (meaning this test can be used) for the duration of the COVID-19 declaration under  Section 564(b)(1) of the Act, 21 U.S.C.section 360bbb-3(b)(1), unless the authorization is terminated  or revoked sooner.       Influenza A by PCR NEGATIVE NEGATIVE Final   Influenza B by PCR NEGATIVE NEGATIVE Final    Comment: (NOTE) The Xpert Xpress SARS-CoV-2/FLU/RSV plus assay is intended as an aid in the diagnosis of influenza from Nasopharyngeal swab specimens and should not be used as a sole basis for treatment. Nasal washings and aspirates are unacceptable for Xpert Xpress SARS-CoV-2/FLU/RSV testing.  Fact Sheet for Patients: BloggerCourse.com  Fact Sheet for Healthcare Providers: SeriousBroker.it  This test is not yet approved or cleared by the [place] FDA and has been authorized for detection and/or diagnosis of SARS-CoV-2 by FDA under an Emergency Use Authorization (EUA). This EUA will remain in effect (meaning this test can be used) for the duration of the COVID-19 declaration under Section 564(b)(1) of the Act, 21 U.S.C. section 360bbb-3(b)(1), unless the authorization is terminated or revoked.     Resp Syncytial Virus by PCR NEGATIVE NEGATIVE Final    Comment: (NOTE) Fact Sheet for Patients: BloggerCourse.com  Fact Sheet for Healthcare Providers: SeriousBroker.it  This test is not yet approved or cleared by the [place] FDA and has been authorized for detection and/or diagnosis of SARS-CoV-2 by FDA under an Emergency Use Authorization (EUA). This EUA will remain in effect (meaning this test can be used) for  the duration of the COVID-19 declaration under Section 564(b)(1) of the Act, 21 U.S.C. section 360bbb-3(b)(1), unless the authorization is terminated or revoked.  Performed at [facility], [address] [place], [place] [address]   Blood Culture (routine x 2)     Status: None (Preliminary result)   Collection Time: 06/26/23  9:00 PM   Specimen: BLOOD  Result Value Ref Range Status   Specimen Description   Final    BLOOD BLOOD RIGHT HAND Performed at [facility], [address] [place], [place] [address]    Special Requests   Final    BOTTLES DRAWN AEROBIC AND ANAEROBIC Blood Culture results may not be optimal due to an inadequate volume of blood received in culture bottles Performed at [facility], [address] [place], [place] [address]    Culture   Final    NO GROWTH < 12 HOURS Performed at [facility], [address], [place], [place] [address]    Report Status PENDING  Incomplete  Blood Culture (routine x 2)     Status: None (Preliminary result)   Collection Time: 06/26/23  9:00 PM   Specimen: BLOOD LEFT HAND  Result Value Ref Range Status   Specimen Description   Final    BLOOD LEFT HAND Performed at [facility], [address], [place], [place] [address]    Special Requests   Final    BOTTLES DRAWN AEROBIC AND ANAEROBIC Blood Culture results may not be optimal due to an inadequate volume of blood received in culture bottles Performed at [facility], [address], [place], [place] [address]    Culture   Final    NO GROWTH < 12 HOURS Performed at [facility], [address], [place], [place] [address]    Report Status PENDING  Incomplete     Radiology Studies: DG Chest Port 1 View Result Date: 06/27/2023 CLINICAL DATA:  Fevers EXAM: PORTABLE CHEST 1 VIEW COMPARISON:  05/16/2023 CT FINDINGS: Cardiac shadow is within normal limits. Lungs  are well aerated  bilaterally. Postsurgical changes are seen. Right chest wall port is noted in satisfactory position. No bony abnormality is noted. IMPRESSION: No active disease. Electronically Signed   By: Alcide Clever M.D.   On: 06/27/2023 00:06     Pamella Pert, MD, PhD [facility]  Between 7 am - 7 pm I am available, please contact me via Amion (for emergencies) or Securechat (non urgent messages)  Between 7 pm - 7 am I am not available, please contact night coverage MD/APP via Amion

## 2023-06-27 NOTE — Consult Note (Addendum)
 Referring Provider: Dr. Rhunette Croft, EDP Primary Care Physician:  Nelwyn Salisbury, MD Primary Gastroenterologist:  Dr. Rhea Belton  Reason for Consultation: Anemia  HPI: Luis [name] is a 68 y.o. male with medical history significant for high-grade bladder cancer s/p cystoprostatectomy with urostomy in place receiving radiation treatment, chronic HFpEF, CAD s/p PCI to RCA, severe AS s/p bioprosthetic AVR and ascending aorta replacement, iron deficiency anemia, HTN, HLD, depression/anxiety, OSA on CPAP who presented to the ED for evaluation of fever.  He is supposed to receive chemotherapy for his bladder cancer, but has been delayed due to his renal dysfunction.  Has a new consult appointment with nephrology coming up.  Found to have a hemoglobin of 5.8 g down from around 7.5-8 grams just 12 days ago.  Was heme-negative but he mentions some dark stools at home.  He has been taking Pepto-Bismol regularly, sometimes 2 or 3 times a day for quite some time for complaints of abdominal pain.  His wife says that his appetite has not been great.  S/p two units of PRBCs.  Looks like vitamin B12 levels are somewhat low and it appears that he is just recently been started on vitamin B12 supplements.  Elevated white blood cell count at 12.3 K.  Blood cultures are pending.  Has been placed on antibiotics.   Colonoscopy 09/2018: - Perianal fungal rash found on perianal exam. - One 6 mm polyp in the ascending colon, removed with a cold snare. Resected and retrieved. - One 5 mm polyp in the transverse colon, removed with a cold snare. Resected and retrieved. - Internal hemorrhoids.  1. Surgical [P], colon, ascending, polyp - TUBULAR ADENOMA(S). - NO HIGH GRADE DYSPLASIA OR MALIGNANCY. 2. Surgical [P], colon, transverse, polyp - TUBULAR ADENOMA(S). - NO HIGH GRADE DYSPLASIA OR MALIGNANCY.   Past Medical History:  Diagnosis Date   Anxiety    takes Valium as needed   Aortic stenosis, moderate    Arthritis     Ascending aortic aneurysm (HCC)    CAD (coronary artery disease)    a. s/p PCI of the RCA 8/12 with DES by Dr Excell Seltzer, preserved EF. b. LHC/RHC (2/16) with mean RA 12, PA 32/15, mean PCWP 18, CI 3.47; patent mid and distal RCA stents, 50-60% proximal stenosis small PDA.      Cancer [facility])    bladder   Carotid stenosis    a. Carotid US (05/2013):  Bilateral 1-39% ICA; L thyroid nodule (prior hx of aspiration).   Chronic diastolic CHF (congestive heart failure) (HCC)    Complication of anesthesia    difficulty waking up after gallbladder surgery   Depression    Dyspnea    Essential hypertension    GERD (gastroesophageal reflux disease)    if needed will take OTC meds    Heart murmur    History of colonic polyps    hyperplastic   Hyperlipidemia    Joint pain    Lesion of bladder    Myocardial infarction (HCC) 2012   Obesity (BMI 30-39.9) 02/29/2016   Pre-diabetes    Restless leg    Sleep apnea    uses cpap   Tubular adenoma of colon    Vertigo    takes Meclizine as needed    Past Surgical History:  Procedure Laterality Date   AORTIC VALVE REPLACEMENT N/A 09/16/2021   Procedure: AORTIC VALVE REPLACEMENT (AVR);  Surgeon: Alleen Borne, MD;  Location: [facility] OR;  Service: Open Heart Surgery;  Laterality: N/A;  CATARACT EXTRACTION   4 YRS AGO   BOTH EYES   CHOLECYSTECTOMY  07/21/2011   Procedure: LAPAROSCOPIC CHOLECYSTECTOMY WITH INTRAOPERATIVE CHOLANGIOGRAM;  Surgeon: Kandis Cocking, MD;  Location: [facility]S;  Service: General;  Laterality: N/A;   CORONARY ANGIOPLASTY  2012   2 stents   coronary stenting     s/p PCI of the RCA by Dr Excell Seltzer 8/12 with 2 promus stents   CYSTOSCOPY W/ RETROGRADES Bilateral 12/13/2019   Procedure: CYSTOSCOPY WITH RETROGRADE PYELOGRAM;  Surgeon: Sebastian Ache, MD;  Location: [facility];  Service: Urology;  Laterality: Bilateral;   CYSTOSCOPY W/ RETROGRADES Bilateral 10/14/2020   Procedure: CYSTOSCOPY WITH RETROGRADE PYELOGRAM;  Surgeon:  Sebastian Ache, MD;  Location: [facility];  Service: Urology;  Laterality: Bilateral;   CYSTOSCOPY W/ RETROGRADES Bilateral 12/16/2020   Procedure: CYSTOSCOPY WITH RETROGRADE PYELOGRAM;  Surgeon: Sebastian Ache, MD;  Location: [facility];  Service: Urology;  Laterality: Bilateral;   CYSTOSCOPY W/ RETROGRADES Bilateral 06/03/2022   Procedure: CYSTOSCOPY WITH RETROGRADE PYELOGRAM;  Surgeon: Sebastian Ache, MD;  Location: [facility];  Service: Urology;  Laterality: Bilateral;   CYSTOSCOPY W/ RETROGRADES Bilateral 12/28/2022   Procedure: CYSTOSCOPY WITH RETROGRADE PYELOGRAM, FULGARATION OF BLEEDERS;  Surgeon: Loletta Parish., MD;  Location: [facility];  Service: Urology;  Laterality: Bilateral;   CYSTOSCOPY WITH INJECTION N/A 03/03/2023   Procedure: CYSTOSCOPY WITH INDOCYANINE INJECTION;  Surgeon: Loletta Parish., MD;  Location: [facility];  Service: Urology;  Laterality: N/A;  360 MINUTES   IR IMAGING GUIDED PORT INSERTION  05/29/2023   LEFT AND RIGHT HEART CATHETERIZATION WITH CORONARY ANGIOGRAM N/A 06/23/2014   Procedure: LEFT AND RIGHT HEART CATHETERIZATION WITH CORONARY ANGIOGRAM;  Surgeon: Laurey Morale, MD;  Location: [facility] CATH LAB;  Service: Cardiovascular;  Laterality: N/A;   NECK SURGERY  03/23/09   per Dr. Ophelia Charter, cervical fusion    PERICARDIOCENTESIS N/A 09/28/2021   Procedure: PERICARDIOCENTESIS;  Surgeon: Corky Crafts, MD;  Location: [facility] INVASIVE CV LAB;  Service: Cardiovascular;  Laterality: N/A;   REPLACEMENT ASCENDING AORTA N/A 09/16/2021   Procedure: REPLACEMENT ASCENDING AORTA WITH 30 X HEMASHIELD PLATINUM WOVEN DOUBLE VELOUR VASCULAR GRAFT;  Surgeon: Alleen Borne, MD;  Location: [facility] OR;  Service: Open Heart Surgery;  Laterality: N/A;  CIRC ARREST   right elbow surgery     RIGHT HEART CATH N/A 06/15/2023   Procedure: RIGHT HEART CATH;  Surgeon: Laurey Morale, MD;  Location: [facility] INVASIVE CV LAB;  Service: Cardiovascular;  Laterality: N/A;    RIGHT HEART CATH AND CORONARY ANGIOGRAPHY N/A 07/08/2021   Procedure: RIGHT HEART CATH AND CORONARY ANGIOGRAPHY;  Surgeon: Laurey Morale, MD;  Location: [facility] INVASIVE CV LAB;  Service: Cardiovascular;  Laterality: N/A;   ROBOT ASSISTED LAPAROSCOPIC COMPLETE CYSTECT ILEAL CONDUIT N/A 03/03/2023   Procedure: XI ROBOTIC ASSISTED LAPAROSCOPIC COMPLETE CYSTECTECTOMY WITH  ILEAL CONDUIT DIVERSION;  Surgeon: Loletta Parish., MD;  Location: [facility];  Service: Urology;  Laterality: N/A;   ROBOT ASSISTED LAPAROSCOPIC RADICAL PROSTATECTOMY N/A 03/03/2023   Procedure: XI ROBOTIC ASSISTED LAPAROSCOPIC RADICAL PROSTATECTOMY WITH LYMPH NODE DISSECTION;  Surgeon: Loletta Parish., MD;  Location: [facility];  Service: Urology;  Laterality: N/A;   solonscopy  05/23/08   per Dr. Celene Kras hemorrhoids only, repeat in 5 years   SUBXYPHOID PERICARDIAL WINDOW N/A 09/28/2021   Procedure: SUBXYPHOID PERICARDIAL WINDOW;  Surgeon: Alleen Borne, MD;  Location: [facility] OR;  Service: Thoracic;  Laterality: N/A;  TEE WITHOUT CARDIOVERSION N/A 06/23/2014   Procedure: TRANSESOPHAGEAL ECHOCARDIOGRAM (TEE);  Surgeon: Laurey Morale, MD;  Location: [facility] ENDOSCOPY;  Service: Cardiovascular;  Laterality: N/A;   TEE WITHOUT CARDIOVERSION N/A 01/21/2016   Procedure: TRANSESOPHAGEAL ECHOCARDIOGRAM (TEE);  Surgeon: Laurey Morale, MD;  Location: [facility] ENDOSCOPY;  Service: Cardiovascular;  Laterality: N/A;   TEE WITHOUT CARDIOVERSION N/A 09/16/2021   Procedure: TRANSESOPHAGEAL ECHOCARDIOGRAM (TEE);  Surgeon: Alleen Borne, MD;  Location: [facility] OR;  Service: Open Heart Surgery;  Laterality: N/A;   TEE WITHOUT CARDIOVERSION N/A 09/28/2021   Procedure: TRANSESOPHAGEAL ECHOCARDIOGRAM (TEE);  Surgeon: Alleen Borne, MD;  Location: [facility] OR;  Service: Thoracic;  Laterality: N/A;   TONSILLECTOMY     TRANSESOPHAGEAL ECHOCARDIOGRAM (CATH LAB) N/A 04/17/2023   Procedure: TRANSESOPHAGEAL ECHOCARDIOGRAM;  Surgeon: Jake Bathe, MD;  Location: [facility]  INVASIVE CV LAB;  Service: Cardiovascular;  Laterality: N/A;   TRANSURETHRAL RESECTION OF BLADDER TUMOR N/A 10/21/2019   Procedure: TRANSURETHRAL RESECTION OF BLADDER TUMOR (TURBT);  Surgeon: Ihor Gully, MD;  Location: [facility];  Service: Urology;  Laterality: N/A;   TRANSURETHRAL RESECTION OF BLADDER TUMOR N/A 12/13/2019   Procedure: TRANSURETHRAL RESECTION OF BLADDER TUMOR (TURBT);  Surgeon: Sebastian Ache, MD;  Location: [facility];  Service: Urology;  Laterality: N/A;  1 HR   TRANSURETHRAL RESECTION OF BLADDER TUMOR N/A 10/14/2020   Procedure: TRANSURETHRAL RESECTION OF BLADDER TUMOR (TURBT);  Surgeon: Sebastian Ache, MD;  Location: [facility];  Service: Urology;  Laterality: N/A;   TRANSURETHRAL RESECTION OF BLADDER TUMOR N/A 12/16/2020   Procedure: RESTAGING TRANSURETHRAL RESECTION OF BLADDER TUMOR (TURBT);  Surgeon: Sebastian Ache, MD;  Location: [facility];  Service: Urology;  Laterality: N/A;   TRANSURETHRAL RESECTION OF BLADDER TUMOR N/A 06/03/2022   Procedure: TRANSURETHRAL RESECTION OF BLADDER TUMOR (TURBT);  Surgeon: Sebastian Ache, MD;  Location: [facility];  Service: Urology;  Laterality: N/A;   UMBILICAL HERNIA REPAIR  03/03/2023   Procedure: HERNIA REPAIR UMBILICAL;  Surgeon: Loletta Parish., MD;  Location: [facility];  Service: Urology;;    Prior to Admission medications   Medication Sig Start Date End Date Taking? Authorizing Provider  acetaminophen (TYLENOL) 325 MG tablet Take 650 mg by mouth every 6 (six) hours as needed (for pain).   Yes [provider]  amLODipine (NORVASC) 5 MG tablet Take 1 tablet (5 mg total) by mouth daily. 05/16/23  Yes Jaci Standard, MD  aspirin EC 81 MG tablet Take 81 mg by mouth in the morning.   Yes [provider]  carvedilol (COREG) 3.125 MG tablet TAKE 1 TABLET BY MOUTH TWICE A DAY WITH FOOD 05/10/23  Yes Laurey Morale, MD  cyanocobalamin (VITAMIN B12) 1000  MCG tablet Take 1 tablet (1,000 mcg total) by mouth daily. 06/15/23  Yes Jaci Standard, MD  diazepam (VALIUM) 5 MG tablet TAKE 1 TABLET BY MOUTH EVERY 12 HOURS AS NEEDED FOR ANXIETY. 03/29/23  Yes Nelwyn Salisbury, MD  furosemide (LASIX) 20 MG tablet Take 1 tablet (20 mg total) by mouth daily. 06/15/23  Yes Laurey Morale, MD  ipratropium (ATROVENT) 0.03 % nasal spray Place 2 sprays into both nostrils every 12 (twelve) hours as needed for rhinitis.   Yes [provider]  lidocaine-prilocaine (EMLA) cream Apply 1 Application topically as needed. 05/16/23  Yes Jaci Standard, MD  Magnesium 500 MG TABS Take 500 mg by mouth in the morning.   Yes [provider]  meclizine (ANTIVERT) 25 MG tablet Take 1 tablet (25 mg total) by mouth 3 (three) times daily as needed for dizziness. 09/09/22  Yes Nelwyn Salisbury, MD  multivitamin-iron-minerals-folic acid (CENTRUM) chewable tablet Chew 1 tablet by mouth daily. 09/23/21  Yes Barrett, Erin R, PA-C  nitroGLYCERIN (NITROSTAT) 0.4 MG SL tablet Place 0.4 mg under the tongue every 5 (five) minutes as needed for chest pain. 11/03/17 12/05/25 Yes [provider]  ondansetron (ZOFRAN) 8 MG tablet Take 1 tablet (8 mg total) by mouth every 8 (eight) hours as needed. 05/16/23  Yes Jaci Standard, MD  potassium chloride SA (KLOR-CON M20) 20 MEQ tablet Take 1 tablet (20 mEq total) by mouth daily. 05/26/23  Yes Milford, Anderson Malta, FNP  prochlorperazine (COMPAZINE) 10 MG tablet Take 1 tablet (10 mg total) by mouth every 6 (six) hours as needed for nausea or vomiting. 05/16/23  Yes Jaci Standard, MD  rosuvastatin (CRESTOR) 20 MG tablet TAKE 1 TABLET BY MOUTH EVERYDAY AT BEDTIME 02/06/23  Yes Milford, Jessica M, FNP  venlafaxine XR (EFFEXOR-XR) 150 MG 24 hr capsule TAKE 1 CAPSULE BY MOUTH DAILY WITH BREAKFAST. 05/11/23  Yes Nelwyn Salisbury, MD  JARDIANCE 10 MG TABS tablet Take 1 tablet (10 mg total) by mouth daily. Patient not taking: Reported on  06/20/2023 05/10/23   Laurey Morale, MD    Current Facility-Administered Medications  Medication Dose Route Frequency Provider Last Rate Last Admin   0.9 %  sodium chloride infusion (Manually program via Guardrails IV Fluids)   Intravenous Once Derwood Kaplan, MD   Held at 06/27/23 0309   acetaminophen (TYLENOL) tablet 650 mg  650 mg Oral Q6H PRN Charlsie Quest, MD       Or   acetaminophen (TYLENOL) suppository 650 mg  650 mg Rectal Q6H PRN Charlsie Quest, MD       amLODipine (NORVASC) tablet 5 mg  5 mg Oral Daily Patel, Vishal R, MD       Ampicillin-Sulbactam (UNASYN) 3 g in sodium chloride 0.9 % 100 mL IVPB  3 g Intravenous Q8H Maurice March, [facility]   Stopped at 06/27/23 0847   carvedilol (COREG) tablet 3.125 mg  3.125 mg Oral BID WC Darreld Mclean R, MD   3.125 mg at 06/27/23 0744   diazepam (VALIUM) tablet 5 mg  5 mg Oral Q12H PRN Charlsie Quest, MD       furosemide (LASIX) tablet 20 mg  20 mg Oral Daily Charlsie Quest, MD       ondansetron (ZOFRAN) tablet 4 mg  4 mg Oral Q6H PRN Charlsie Quest, MD       Or   ondansetron (ZOFRAN) injection 4 mg  4 mg Intravenous Q6H PRN Charlsie Quest, MD       potassium chloride SA (KLOR-CON M) CR tablet 20 mEq  20 mEq Oral Daily Darreld Mclean R, MD       rosuvastatin (CRESTOR) tablet 20 mg  20 mg Oral QHS Patel, Vishal R, MD       senna-docusate (Senokot-S) tablet 1 tablet  1 tablet Oral QHS PRN Darreld Mclean R, MD       sodium chloride flush (NS) 0.9 % injection 3 mL  3 mL Intravenous Q12H Darreld Mclean R, MD   3 mL at 06/27/23 0135   venlafaxine XR (EFFEXOR-XR) 24 hr capsule 150 mg  150 mg Oral Q breakfast Charlsie Quest, MD  Current Outpatient Medications  Medication Sig Dispense Refill   acetaminophen (TYLENOL) 325 MG tablet Take 650 mg by mouth every 6 (six) hours as needed (for pain).     amLODipine (NORVASC) 5 MG tablet Take 1 tablet (5 mg total) by mouth daily. 90 tablet 1   aspirin EC 81 MG tablet Take 81 mg by mouth in the  morning.     carvedilol (COREG) 3.125 MG tablet TAKE 1 TABLET BY MOUTH TWICE A DAY WITH FOOD 180 tablet 1   cyanocobalamin (VITAMIN B12) 1000 MCG tablet Take 1 tablet (1,000 mcg total) by mouth daily. 30 tablet 6   diazepam (VALIUM) 5 MG tablet TAKE 1 TABLET BY MOUTH EVERY 12 HOURS AS NEEDED FOR ANXIETY. 60 tablet 5   furosemide (LASIX) 20 MG tablet Take 1 tablet (20 mg total) by mouth daily. 45 tablet 3   ipratropium (ATROVENT) 0.03 % nasal spray Place 2 sprays into both nostrils every 12 (twelve) hours as needed for rhinitis.     lidocaine-prilocaine (EMLA) cream Apply 1 Application topically as needed. 30 g 0   Magnesium 500 MG TABS Take 500 mg by mouth in the morning.     meclizine (ANTIVERT) 25 MG tablet Take 1 tablet (25 mg total) by mouth 3 (three) times daily as needed for dizziness. 30 tablet 0   multivitamin-iron-minerals-folic acid (CENTRUM) chewable tablet Chew 1 tablet by mouth daily.     nitroGLYCERIN (NITROSTAT) 0.4 MG SL tablet Place 0.4 mg under the tongue every 5 (five) minutes as needed for chest pain.     ondansetron (ZOFRAN) 8 MG tablet Take 1 tablet (8 mg total) by mouth every 8 (eight) hours as needed. 30 tablet 0   potassium chloride SA (KLOR-CON M20) 20 MEQ tablet Take 1 tablet (20 mEq total) by mouth daily.     prochlorperazine (COMPAZINE) 10 MG tablet Take 1 tablet (10 mg total) by mouth every 6 (six) hours as needed for nausea or vomiting. 30 tablet 0   rosuvastatin (CRESTOR) 20 MG tablet TAKE 1 TABLET BY MOUTH EVERYDAY AT BEDTIME 90 tablet 3   venlafaxine XR (EFFEXOR-XR) 150 MG 24 hr capsule TAKE 1 CAPSULE BY MOUTH DAILY WITH BREAKFAST. 90 capsule 0   JARDIANCE 10 MG TABS tablet Take 1 tablet (10 mg total) by mouth daily. (Patient not taking: Reported on 06/20/2023) 30 tablet 11    Allergies as of 06/26/2023   (No Known Allergies)    Family History  Problem Relation Age of Onset   Lung cancer Mother        lung   Esophageal cancer Cousin    Colon cancer Neg Hx     Rectal cancer Neg Hx    Stomach cancer Neg Hx     Social History   Socioeconomic History   Marital status: Married    Spouse name: Not on file   Number of children: 4   Years of education: Not on file   Highest education level: Not on file  Occupational History   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  Tobacco Use   Smoking status: Former    Current packs/day: 0.00    Average packs/day: 2.0 packs/day for 40.0 years (80.0 ttl pk-yrs)    Types: Cigarettes    Start date: 72    Quit date: 2012    Years since quitting: 13.1    Passive exposure: Never   Smokeless tobacco: Never  Vaping Use   Vaping status: Never Used  Substance and Sexual Activity   Alcohol use: No    Alcohol/week: 0.0 standard drinks of alcohol   Drug use: No   Sexual activity: Not on file  Other Topics Concern   Not on file  Social History Narrative   Not on file   Social Drivers of Health   Financial Resource Strain: Low Risk  (11/03/2022)   Overall Financial Resource Strain (CARDIA)    Difficulty of Paying Living Expenses: Not hard at all  Food Insecurity: No Food Insecurity (05/09/2023)   Hunger Vital Sign    Worried About Running Out of Food in the Last Year: Never true    Ran Out of Food in the Last Year: Never true  Transportation Needs: No Transportation Needs (05/09/2023)   PRAPARE - Administrator, Civil Service (Medical): No    Lack of Transportation (Non-Medical): No  Physical Activity: Unknown (11/03/2022)   Exercise Vital Sign    Days of Exercise per Week: Patient unable to answer    Minutes of Exercise per Session: 30 min  Stress: No Stress Concern Present (11/03/2022)   [employer] of Occupational Health - Occupational Stress Questionnaire    Feeling of Stress : Not at all  Social Connections: Moderately Isolated (11/03/2022)   Social Connection and Isolation Panel [NHANES]    Frequency of Communication with Friends and Family: More  than three times a week    Frequency of Social Gatherings with Friends and Family: More than three times a week    Attends Religious Services: Never    Database administrator or Organizations: No    Attends Banker Meetings: Never    Marital Status: Married  Catering manager Violence: Not At Risk (05/09/2023)   Humiliation, Afraid, Rape, and Kick questionnaire    Fear of Current or Ex-Partner: No    Emotionally Abused: No    Physically Abused: No    Sexually Abused: No    Review of Systems: ROS otherwise negative except as mentioned in HPI.  Physical Exam: Vital signs in last 24 hours: Temp:  [97.8 F (36.6 C)-101 F (38.3 C)] 99.5 F (37.5 C) (03/04 0621) Pulse Rate:  [55-72] 66 (03/04 0715) Resp:  [16-23] 21 (03/04 0715) BP: (105-168)/(41-80) 115/43 (03/04 0715) SpO2:  [95 %-100 %] 98 % (03/04 0715) Weight:  [409 kg] 112 kg (03/04 0130)   General:  Alert, Well-developed, well-nourished, pleasant and cooperative in NAD Head:  Normocephalic and atraumatic. Eyes:  Sclera clear, no icterus.  Conjunctiva pink. Ears:  Normal auditory acuity. Mouth:  No deformity or lesions.   Lungs:  Clear throughout to auscultation.  No wheezes, crackles, or rhonchi.  Heart:  Regular rate and rhythm; SEM noted. Abdomen:  Soft, non-distended.  Bowel sounds present.  Urostomy bag noted, filled with urine.  Mild diffuse tenderness to palpation. Msk:  Symmetrical without gross deformities. Pulses:  Normal pulses noted. Extremities:  Without clubbing or edema. Neurologic:  Alert and oriented x 4;  grossly normal neurologically. Skin:  Intact without significant lesions or rashes. Psych:  Alert and cooperative. Normal mood and affect.  Intake/Output from previous day: 03/03 0701 - 03/04 0700 In: 348 [I.V.:60; Blood:288] Out: -  Intake/Output this shift: Total I/O In: 100 [IV Piggyback:100] Out: -   Lab Results: Recent Labs    06/26/23 2054 06/26/23 2100  WBC  --  12.3*   HGB 8.2* 5.8*  HCT 24.0* 18.0*  PLT  --  252   BMET Recent Labs  06/26/23 2054 06/26/23 2100  NA 133* 129*  K 5.9* 4.4  CL 106 101  CO2  --  18*  GLUCOSE 172* 191*  BUN 40* 30*  CREATININE 2.70* 2.26*  CALCIUM  --  8.0*   LFT Recent Labs    06/26/23 2100  PROT 7.8  ALBUMIN 2.8*  AST 32  ALT 27  ALKPHOS 101  BILITOT 0.8   PT/INR Recent Labs    06/26/23 2100  LABPROT 15.9*  INR 1.3*   Studies/Results: DG Chest Port 1 View Result Date: 06/27/2023 CLINICAL DATA:  Fevers EXAM: PORTABLE CHEST 1 VIEW COMPARISON:  05/16/2023 CT FINDINGS: Cardiac shadow is within normal limits. Lungs are well aerated bilaterally. Postsurgical changes are seen. Right chest wall port is noted in satisfactory position. No bony abnormality is noted. IMPRESSION: No active disease. Electronically Signed   By: Alcide Clever M.D.   On: 06/27/2023 00:06    IMPRESSION:  68 year old male with bladder cancer undergoing radiation therapy and history of severe AS status post bioprosthetic AVR and ascending aorta replacement with history of septic shock in December 2024 presenting with fevers.  Found to be anemic. *Anemia: Presented with hemoglobin of 5.8 g down from around 7.5-8 grams just 12 days ago.  Was heme-negative but he mentions some dark stools.  He had been taking Pepto-Bismol regularly, sometimes 2-3 times a day. S/p two units of PRBCs.  Looks like vitamin B12 levels are somewhat low and it appears that he has just recently been started on vitamin B12 supplements. *History of colon polyps: Last colonoscopy in June 2020 with a 7-year recall for June 2027. *High-grade bladder cancer status post cystoprostatectomy with urostomy in place, receiving radiation treatment.  Supposed to receive chemotherapy, but has been delayed due to his renal dysfunction. *History of septic shock due to Enterococcus faecalis bacteremia in December and completed 4 weeks of IV ampicillin.  Presented with fevers on this  occasion as well.  Elevated white blood cell count of 12.3 K. *Severe AS status post bioprosthetic AVR and ascending aorta replacement *Renal dysfunction: Has an upcoming appointment with nephrology.  PLAN: -Monitor hemoglobin and transfuse further if needed. -Blood cultures are pending. -? EGD possible 3/5 per Dr. Chales Abrahams. -Consider CT scan abdomen and pelvis without contrast for evaluation of complaints of ongoing abdominal pain.   Princella Pellegrini. Zehr  06/27/2023, 8:52 AM    Attending physician's note   I have reviewed the chart and did not examine the patient. Went to see pt in ED- he was in transition. I performed a substantive portion of this encounter, including complete performance of at least one of the key components, in conjunction with the APP. I agree with the Advanced Practitioner's note, impression and recommendations.    Severe anemia with ?melena. Heme neg now. Adm Hb 5.8 (baseline 8) s/p 2U to Hb 8. Neg CT AP. Neg colon June 2020 except for small polyps (rpt 7 yrs).  Bladder Ca undergoing XRT, s/p cystoprostatectomy with ileal conduit.  Bioprosthetic AVR w/t asc aortic graft, CAD, OSA  Adm d/t fever on rocephin. BC+ for Kleb (likely urosepsis). H/O enterococcus septic shock Dec 2024. Neg TEE for SBE.  AKI on CKD3  Plan: -IV protonix -Trend CBC. Keep Hb>7 -EGD in AM with Dr Thea Silversmith.   Edman Circle, MD [facility] [phone]

## 2023-06-27 NOTE — H&P (View-Only) (Signed)
 Referring Provider: Dr. Rhunette Croft, EDP Primary Care Physician:  Nelwyn Salisbury, MD Primary Gastroenterologist:  Dr. Rhea Belton  Reason for Consultation: Anemia  HPI: Luis [name] is a 68 y.o. male with medical history significant for high-grade bladder cancer s/p cystoprostatectomy with urostomy in place receiving radiation treatment, chronic HFpEF, CAD s/p PCI to RCA, severe AS s/p bioprosthetic AVR and ascending aorta replacement, iron deficiency anemia, HTN, HLD, depression/anxiety, OSA on CPAP who presented to the ED for evaluation of fever.  He is supposed to receive chemotherapy for his bladder cancer, but has been delayed due to his renal dysfunction.  Has a new consult appointment with nephrology coming up.  Found to have a hemoglobin of 5.8 g down from around 7.5-8 grams just 12 days ago.  Was heme-negative but he mentions some dark stools at home.  He has been taking Pepto-Bismol regularly, sometimes 2 or 3 times a day for quite some time for complaints of abdominal pain.  His wife says that his appetite has not been great.  S/p two units of PRBCs.  Looks like vitamin B12 levels are somewhat low and it appears that he is just recently been started on vitamin B12 supplements.  Elevated white blood cell count at 12.3 K.  Blood cultures are pending.  Has been placed on antibiotics.   Colonoscopy 09/2018: - Perianal fungal rash found on perianal exam. - One 6 mm polyp in the ascending colon, removed with a cold snare. Resected and retrieved. - One 5 mm polyp in the transverse colon, removed with a cold snare. Resected and retrieved. - Internal hemorrhoids.  1. Surgical [P], colon, ascending, polyp - TUBULAR ADENOMA(S). - NO HIGH GRADE DYSPLASIA OR MALIGNANCY. 2. Surgical [P], colon, transverse, polyp - TUBULAR ADENOMA(S). - NO HIGH GRADE DYSPLASIA OR MALIGNANCY.   Past Medical History:  Diagnosis Date   Anxiety    takes Valium as needed   Aortic stenosis, moderate    Arthritis     Ascending aortic aneurysm (HCC)    CAD (coronary artery disease)    a. s/p PCI of the RCA 8/12 with DES by Dr Excell Seltzer, preserved EF. b. LHC/RHC (2/16) with mean RA 12, PA 32/15, mean PCWP 18, CI 3.47; patent mid and distal RCA stents, 50-60% proximal stenosis small PDA.      Cancer [facility])    bladder   Carotid stenosis    a. Carotid US (05/2013):  Bilateral 1-39% ICA; L thyroid nodule (prior hx of aspiration).   Chronic diastolic CHF (congestive heart failure) (HCC)    Complication of anesthesia    difficulty waking up after gallbladder surgery   Depression    Dyspnea    Essential hypertension    GERD (gastroesophageal reflux disease)    if needed will take OTC meds    Heart murmur    History of colonic polyps    hyperplastic   Hyperlipidemia    Joint pain    Lesion of bladder    Myocardial infarction (HCC) 2012   Obesity (BMI 30-39.9) 02/29/2016   Pre-diabetes    Restless leg    Sleep apnea    uses cpap   Tubular adenoma of colon    Vertigo    takes Meclizine as needed    Past Surgical History:  Procedure Laterality Date   AORTIC VALVE REPLACEMENT N/A 09/16/2021   Procedure: AORTIC VALVE REPLACEMENT (AVR);  Surgeon: Alleen Borne, MD;  Location: [facility] OR;  Service: Open Heart Surgery;  Laterality: N/A;  CATARACT EXTRACTION   4 YRS AGO   BOTH EYES   CHOLECYSTECTOMY  07/21/2011   Procedure: LAPAROSCOPIC CHOLECYSTECTOMY WITH INTRAOPERATIVE CHOLANGIOGRAM;  Surgeon: Kandis Cocking, MD;  Location: [facility]S;  Service: General;  Laterality: N/A;   CORONARY ANGIOPLASTY  2012   2 stents   coronary stenting     s/p PCI of the RCA by Dr Excell Seltzer 8/12 with 2 promus stents   CYSTOSCOPY W/ RETROGRADES Bilateral 12/13/2019   Procedure: CYSTOSCOPY WITH RETROGRADE PYELOGRAM;  Surgeon: Sebastian Ache, MD;  Location: [facility];  Service: Urology;  Laterality: Bilateral;   CYSTOSCOPY W/ RETROGRADES Bilateral 10/14/2020   Procedure: CYSTOSCOPY WITH RETROGRADE PYELOGRAM;  Surgeon:  Sebastian Ache, MD;  Location: [facility];  Service: Urology;  Laterality: Bilateral;   CYSTOSCOPY W/ RETROGRADES Bilateral 12/16/2020   Procedure: CYSTOSCOPY WITH RETROGRADE PYELOGRAM;  Surgeon: Sebastian Ache, MD;  Location: [facility];  Service: Urology;  Laterality: Bilateral;   CYSTOSCOPY W/ RETROGRADES Bilateral 06/03/2022   Procedure: CYSTOSCOPY WITH RETROGRADE PYELOGRAM;  Surgeon: Sebastian Ache, MD;  Location: [facility];  Service: Urology;  Laterality: Bilateral;   CYSTOSCOPY W/ RETROGRADES Bilateral 12/28/2022   Procedure: CYSTOSCOPY WITH RETROGRADE PYELOGRAM, FULGARATION OF BLEEDERS;  Surgeon: Loletta Parish., MD;  Location: [facility];  Service: Urology;  Laterality: Bilateral;   CYSTOSCOPY WITH INJECTION N/A 03/03/2023   Procedure: CYSTOSCOPY WITH INDOCYANINE INJECTION;  Surgeon: Loletta Parish., MD;  Location: [facility];  Service: Urology;  Laterality: N/A;  360 MINUTES   IR IMAGING GUIDED PORT INSERTION  05/29/2023   LEFT AND RIGHT HEART CATHETERIZATION WITH CORONARY ANGIOGRAM N/A 06/23/2014   Procedure: LEFT AND RIGHT HEART CATHETERIZATION WITH CORONARY ANGIOGRAM;  Surgeon: Laurey Morale, MD;  Location: [facility] CATH LAB;  Service: Cardiovascular;  Laterality: N/A;   NECK SURGERY  03/23/09   per Dr. Ophelia Charter, cervical fusion    PERICARDIOCENTESIS N/A 09/28/2021   Procedure: PERICARDIOCENTESIS;  Surgeon: Corky Crafts, MD;  Location: [facility] INVASIVE CV LAB;  Service: Cardiovascular;  Laterality: N/A;   REPLACEMENT ASCENDING AORTA N/A 09/16/2021   Procedure: REPLACEMENT ASCENDING AORTA WITH 30 X HEMASHIELD PLATINUM WOVEN DOUBLE VELOUR VASCULAR GRAFT;  Surgeon: Alleen Borne, MD;  Location: [facility] OR;  Service: Open Heart Surgery;  Laterality: N/A;  CIRC ARREST   right elbow surgery     RIGHT HEART CATH N/A 06/15/2023   Procedure: RIGHT HEART CATH;  Surgeon: Laurey Morale, MD;  Location: [facility] INVASIVE CV LAB;  Service: Cardiovascular;  Laterality: N/A;    RIGHT HEART CATH AND CORONARY ANGIOGRAPHY N/A 07/08/2021   Procedure: RIGHT HEART CATH AND CORONARY ANGIOGRAPHY;  Surgeon: Laurey Morale, MD;  Location: [facility] INVASIVE CV LAB;  Service: Cardiovascular;  Laterality: N/A;   ROBOT ASSISTED LAPAROSCOPIC COMPLETE CYSTECT ILEAL CONDUIT N/A 03/03/2023   Procedure: XI ROBOTIC ASSISTED LAPAROSCOPIC COMPLETE CYSTECTECTOMY WITH  ILEAL CONDUIT DIVERSION;  Surgeon: Loletta Parish., MD;  Location: [facility];  Service: Urology;  Laterality: N/A;   ROBOT ASSISTED LAPAROSCOPIC RADICAL PROSTATECTOMY N/A 03/03/2023   Procedure: XI ROBOTIC ASSISTED LAPAROSCOPIC RADICAL PROSTATECTOMY WITH LYMPH NODE DISSECTION;  Surgeon: Loletta Parish., MD;  Location: [facility];  Service: Urology;  Laterality: N/A;   solonscopy  05/23/08   per Dr. Celene Kras hemorrhoids only, repeat in 5 years   SUBXYPHOID PERICARDIAL WINDOW N/A 09/28/2021   Procedure: SUBXYPHOID PERICARDIAL WINDOW;  Surgeon: Alleen Borne, MD;  Location: [facility] OR;  Service: Thoracic;  Laterality: N/A;  TEE WITHOUT CARDIOVERSION N/A 06/23/2014   Procedure: TRANSESOPHAGEAL ECHOCARDIOGRAM (TEE);  Surgeon: Laurey Morale, MD;  Location: [facility] ENDOSCOPY;  Service: Cardiovascular;  Laterality: N/A;   TEE WITHOUT CARDIOVERSION N/A 01/21/2016   Procedure: TRANSESOPHAGEAL ECHOCARDIOGRAM (TEE);  Surgeon: Laurey Morale, MD;  Location: [facility] ENDOSCOPY;  Service: Cardiovascular;  Laterality: N/A;   TEE WITHOUT CARDIOVERSION N/A 09/16/2021   Procedure: TRANSESOPHAGEAL ECHOCARDIOGRAM (TEE);  Surgeon: Alleen Borne, MD;  Location: [facility] OR;  Service: Open Heart Surgery;  Laterality: N/A;   TEE WITHOUT CARDIOVERSION N/A 09/28/2021   Procedure: TRANSESOPHAGEAL ECHOCARDIOGRAM (TEE);  Surgeon: Alleen Borne, MD;  Location: [facility] OR;  Service: Thoracic;  Laterality: N/A;   TONSILLECTOMY     TRANSESOPHAGEAL ECHOCARDIOGRAM (CATH LAB) N/A 04/17/2023   Procedure: TRANSESOPHAGEAL ECHOCARDIOGRAM;  Surgeon: Jake Bathe, MD;  Location: [facility]  INVASIVE CV LAB;  Service: Cardiovascular;  Laterality: N/A;   TRANSURETHRAL RESECTION OF BLADDER TUMOR N/A 10/21/2019   Procedure: TRANSURETHRAL RESECTION OF BLADDER TUMOR (TURBT);  Surgeon: Ihor Gully, MD;  Location: [facility];  Service: Urology;  Laterality: N/A;   TRANSURETHRAL RESECTION OF BLADDER TUMOR N/A 12/13/2019   Procedure: TRANSURETHRAL RESECTION OF BLADDER TUMOR (TURBT);  Surgeon: Sebastian Ache, MD;  Location: [facility];  Service: Urology;  Laterality: N/A;  1 HR   TRANSURETHRAL RESECTION OF BLADDER TUMOR N/A 10/14/2020   Procedure: TRANSURETHRAL RESECTION OF BLADDER TUMOR (TURBT);  Surgeon: Sebastian Ache, MD;  Location: [facility];  Service: Urology;  Laterality: N/A;   TRANSURETHRAL RESECTION OF BLADDER TUMOR N/A 12/16/2020   Procedure: RESTAGING TRANSURETHRAL RESECTION OF BLADDER TUMOR (TURBT);  Surgeon: Sebastian Ache, MD;  Location: [facility];  Service: Urology;  Laterality: N/A;   TRANSURETHRAL RESECTION OF BLADDER TUMOR N/A 06/03/2022   Procedure: TRANSURETHRAL RESECTION OF BLADDER TUMOR (TURBT);  Surgeon: Sebastian Ache, MD;  Location[facility];  Service: Urology;  Laterality: N/A;   UMBILICAL HERNIA REPAIR  03/03/2023   Procedure: HERNIA REPAIR UMBILICAL;  Surgeon: Loletta Parish., MD;  Location: [facility];  Service: Urology;;    Prior to Admission medications   Medication Sig Start Date End Date Taking? Authorizing Provider  acetaminophen (TYLENOL) 325 MG tablet Take 650 mg by mouth every 6 (six) hours as needed (for pain).   Yes [provider]  amLODipine (NORVASC) 5 MG tablet Take 1 tablet (5 mg total) by mouth daily. 05/16/23  Yes Jaci Standard, MD  aspirin EC 81 MG tablet Take 81 mg by mouth in the morning.   Yes [provider]  carvedilol (COREG) 3.125 MG tablet TAKE 1 TABLET BY MOUTH TWICE A DAY WITH FOOD 05/10/23  Yes Laurey Morale, MD  cyanocobalamin (VITAMIN B12) 1000  MCG tablet Take 1 tablet (1,000 mcg total) by mouth daily. 06/15/23  Yes Jaci Standard, MD  diazepam (VALIUM) 5 MG tablet TAKE 1 TABLET BY MOUTH EVERY 12 HOURS AS NEEDED FOR ANXIETY. 03/29/23  Yes Nelwyn Salisbury, MD  furosemide (LASIX) 20 MG tablet Take 1 tablet (20 mg total) by mouth daily. 06/15/23  Yes Laurey Morale, MD  ipratropium (ATROVENT) 0.03 % nasal spray Place 2 sprays into both nostrils every 12 (twelve) hours as needed for rhinitis.   Yes [provider]  lidocaine-prilocaine (EMLA) cream Apply 1 Application topically as needed. 05/16/23  Yes Jaci Standard, MD  Magnesium 500 MG TABS Take 500 mg by mouth in the morning.   Yes [provider]  meclizine (ANTIVERT) 25 MG tablet Take 1 tablet (25 mg total) by mouth 3 (three) times daily as needed for dizziness. 09/09/22  Yes Nelwyn Salisbury, MD  multivitamin-iron-minerals-folic acid (CENTRUM) chewable tablet Chew 1 tablet by mouth daily. 09/23/21  Yes Barrett, Erin R, PA-C  nitroGLYCERIN (NITROSTAT) 0.4 MG SL tablet Place 0.4 mg under the tongue every 5 (five) minutes as needed for chest pain. 11/03/17 12/05/25 Yes [provider]  ondansetron (ZOFRAN) 8 MG tablet Take 1 tablet (8 mg total) by mouth every 8 (eight) hours as needed. 05/16/23  Yes Jaci Standard, MD  potassium chloride SA (KLOR-CON M20) 20 MEQ tablet Take 1 tablet (20 mEq total) by mouth daily. 05/26/23  Yes Milford, Anderson Malta, FNP  prochlorperazine (COMPAZINE) 10 MG tablet Take 1 tablet (10 mg total) by mouth every 6 (six) hours as needed for nausea or vomiting. 05/16/23  Yes Jaci Standard, MD  rosuvastatin (CRESTOR) 20 MG tablet TAKE 1 TABLET BY MOUTH EVERYDAY AT BEDTIME 02/06/23  Yes Milford, Jessica M, FNP  venlafaxine XR (EFFEXOR-XR) 150 MG 24 hr capsule TAKE 1 CAPSULE BY MOUTH DAILY WITH BREAKFAST. 05/11/23  Yes Nelwyn Salisbury, MD  JARDIANCE 10 MG TABS tablet Take 1 tablet (10 mg total) by mouth daily. Patient not taking: Reported on  06/20/2023 05/10/23   Laurey Morale, MD    Current Facility-Administered Medications  Medication Dose Route Frequency Provider Last Rate Last Admin   0.9 %  sodium chloride infusion (Manually program via Guardrails IV Fluids)   Intravenous Once Derwood Kaplan, MD   Held at 06/27/23 0309   acetaminophen (TYLENOL) tablet 650 mg  650 mg Oral Q6H PRN Charlsie Quest, MD       Or   acetaminophen (TYLENOL) suppository 650 mg  650 mg Rectal Q6H PRN Charlsie Quest, MD       amLODipine (NORVASC) tablet 5 mg  5 mg Oral Daily Patel, Vishal R, MD       Ampicillin-Sulbactam (UNASYN) 3 g in sodium chloride 0.9 % 100 mL IVPB  3 g Intravenous Q8H Maurice March, [facility]   Stopped at 06/27/23 0847   carvedilol (COREG) tablet 3.125 mg  3.125 mg Oral BID WC Darreld Mclean R, MD   3.125 mg at 06/27/23 0744   diazepam (VALIUM) tablet 5 mg  5 mg Oral Q12H PRN Charlsie Quest, MD       furosemide (LASIX) tablet 20 mg  20 mg Oral Daily Charlsie Quest, MD       ondansetron (ZOFRAN) tablet 4 mg  4 mg Oral Q6H PRN Charlsie Quest, MD       Or   ondansetron (ZOFRAN) injection 4 mg  4 mg Intravenous Q6H PRN Charlsie Quest, MD       potassium chloride SA (KLOR-CON M) CR tablet 20 mEq  20 mEq Oral Daily Darreld Mclean R, MD       rosuvastatin (CRESTOR) tablet 20 mg  20 mg Oral QHS Patel, Vishal R, MD       senna-docusate (Senokot-S) tablet 1 tablet  1 tablet Oral QHS PRN Darreld Mclean R, MD       sodium chloride flush (NS) 0.9 % injection 3 mL  3 mL Intravenous Q12H Darreld Mclean R, MD   3 mL at 06/27/23 0135   venlafaxine XR (EFFEXOR-XR) 24 hr capsule 150 mg  150 mg Oral Q breakfast Charlsie Quest, MD  Current Outpatient Medications  Medication Sig Dispense Refill   acetaminophen (TYLENOL) 325 MG tablet Take 650 mg by mouth every 6 (six) hours as needed (for pain).     amLODipine (NORVASC) 5 MG tablet Take 1 tablet (5 mg total) by mouth daily. 90 tablet 1   aspirin EC 81 MG tablet Take 81 mg by mouth in the  morning.     carvedilol (COREG) 3.125 MG tablet TAKE 1 TABLET BY MOUTH TWICE A DAY WITH FOOD 180 tablet 1   cyanocobalamin (VITAMIN B12) 1000 MCG tablet Take 1 tablet (1,000 mcg total) by mouth daily. 30 tablet 6   diazepam (VALIUM) 5 MG tablet TAKE 1 TABLET BY MOUTH EVERY 12 HOURS AS NEEDED FOR ANXIETY. 60 tablet 5   furosemide (LASIX) 20 MG tablet Take 1 tablet (20 mg total) by mouth daily. 45 tablet 3   ipratropium (ATROVENT) 0.03 % nasal spray Place 2 sprays into both nostrils every 12 (twelve) hours as needed for rhinitis.     lidocaine-prilocaine (EMLA) cream Apply 1 Application topically as needed. 30 g 0   Magnesium 500 MG TABS Take 500 mg by mouth in the morning.     meclizine (ANTIVERT) 25 MG tablet Take 1 tablet (25 mg total) by mouth 3 (three) times daily as needed for dizziness. 30 tablet 0   multivitamin-iron-minerals-folic acid (CENTRUM) chewable tablet Chew 1 tablet by mouth daily.     nitroGLYCERIN (NITROSTAT) 0.4 MG SL tablet Place 0.4 mg under the tongue every 5 (five) minutes as needed for chest pain.     ondansetron (ZOFRAN) 8 MG tablet Take 1 tablet (8 mg total) by mouth every 8 (eight) hours as needed. 30 tablet 0   potassium chloride SA (KLOR-CON M20) 20 MEQ tablet Take 1 tablet (20 mEq total) by mouth daily.     prochlorperazine (COMPAZINE) 10 MG tablet Take 1 tablet (10 mg total) by mouth every 6 (six) hours as needed for nausea or vomiting. 30 tablet 0   rosuvastatin (CRESTOR) 20 MG tablet TAKE 1 TABLET BY MOUTH EVERYDAY AT BEDTIME 90 tablet 3   venlafaxine XR (EFFEXOR-XR) 150 MG 24 hr capsule TAKE 1 CAPSULE BY MOUTH DAILY WITH BREAKFAST. 90 capsule 0   JARDIANCE 10 MG TABS tablet Take 1 tablet (10 mg total) by mouth daily. (Patient not taking: Reported on 06/20/2023) 30 tablet 11    Allergies as of 06/26/2023   (No Known Allergies)    Family History  Problem Relation Age of Onset   Lung cancer Mother        lung   Esophageal cancer Cousin    Colon cancer Neg Hx     Rectal cancer Neg Hx    Stomach cancer Neg Hx     Social History   Socioeconomic History   Marital status: Married    Spouse name: Not on file   Number of children: 4   Years of education: Not on file   Highest education level: Not on file  Occupational History   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  Tobacco Use   Smoking status: Former    Current packs/day: 0.00    Average packs/day: 2.0 packs/day for 40.0 years (80.0 ttl pk-yrs)    Types: Cigarettes    Start date: 72    Quit date: 2012    Years since quitting: 13.1    Passive exposure: Never   Smokeless tobacco: Never  Vaping Use   Vaping status: Never Used  Substance and Sexual Activity   Alcohol use: No    Alcohol/week: 0.0 standard drinks of alcohol   Drug use: No   Sexual activity: Not on file  Other Topics Concern   Not on file  Social History Narrative   Not on file   Social Drivers of Health   Financial Resource Strain: Low Risk  (11/03/2022)   Overall Financial Resource Strain (CARDIA)    Difficulty of Paying Living Expenses: Not hard at all  Food Insecurity: No Food Insecurity (05/09/2023)   Hunger Vital Sign    Worried About Running Out of Food in the Last Year: Never true    Ran Out of Food in the Last Year: Never true  Transportation Needs: No Transportation Needs (05/09/2023)   PRAPARE - Administrator, Civil Service (Medical): No    Lack of Transportation (Non-Medical): No  Physical Activity: Unknown (11/03/2022)   Exercise Vital Sign    Days of Exercise per Week: Patient unable to answer    Minutes of Exercise per Session: 30 min  Stress: No Stress Concern Present (11/03/2022)   [employer] of Occupational Health - Occupational Stress Questionnaire    Feeling of Stress : Not at all  Social Connections: Moderately Isolated (11/03/2022)   Social Connection and Isolation Panel [NHANES]    Frequency of Communication with Friends and Family: More  than three times a week    Frequency of Social Gatherings with Friends and Family: More than three times a week    Attends Religious Services: Never    Database administrator or Organizations: No    Attends Banker Meetings: Never    Marital Status: Married  Catering manager Violence: Not At Risk (05/09/2023)   Humiliation, Afraid, Rape, and Kick questionnaire    Fear of Current or Ex-Partner: No    Emotionally Abused: No    Physically Abused: No    Sexually Abused: No    Review of Systems: ROS otherwise negative except as mentioned in HPI.  Physical Exam: Vital signs in last 24 hours: Temp:  [97.8 F (36.6 C)-101 F (38.3 C)] 99.5 F (37.5 C) (03/04 0621) Pulse Rate:  [55-72] 66 (03/04 0715) Resp:  [16-23] 21 (03/04 0715) BP: (105-168)/(41-80) 115/43 (03/04 0715) SpO2:  [95 %-100 %] 98 % (03/04 0715) Weight:  [409 kg] 112 kg (03/04 0130)   General:  Alert, Well-developed, well-nourished, pleasant and cooperative in NAD Head:  Normocephalic and atraumatic. Eyes:  Sclera clear, no icterus.  Conjunctiva pink. Ears:  Normal auditory acuity. Mouth:  No deformity or lesions.   Lungs:  Clear throughout to auscultation.  No wheezes, crackles, or rhonchi.  Heart:  Regular rate and rhythm; SEM noted. Abdomen:  Soft, non-distended.  Bowel sounds present.  Urostomy bag noted, filled with urine.  Mild diffuse tenderness to palpation. Msk:  Symmetrical without gross deformities. Pulses:  Normal pulses noted. Extremities:  Without clubbing or edema. Neurologic:  Alert and oriented x 4;  grossly normal neurologically. Skin:  Intact without significant lesions or rashes. Psych:  Alert and cooperative. Normal mood and affect.  Intake/Output from previous day: 03/03 0701 - 03/04 0700 In: 348 [I.V.:60; Blood:288] Out: -  Intake/Output this shift: Total I/O In: 100 [IV Piggyback:100] Out: -   Lab Results: Recent Labs    06/26/23 2054 06/26/23 2100  WBC  --  12.3*   HGB 8.2* 5.8*  HCT 24.0* 18.0*  PLT  --  252   BMET Recent Labs  06/26/23 2054 06/26/23 2100  NA 133* 129*  K 5.9* 4.4  CL 106 101  CO2  --  18*  GLUCOSE 172* 191*  BUN 40* 30*  CREATININE 2.70* 2.26*  CALCIUM  --  8.0*   LFT Recent Labs    06/26/23 2100  PROT 7.8  ALBUMIN 2.8*  AST 32  ALT 27  ALKPHOS 101  BILITOT 0.8   PT/INR Recent Labs    06/26/23 2100  LABPROT 15.9*  INR 1.3*   Studies/Results: DG Chest Port 1 View Result Date: 06/27/2023 CLINICAL DATA:  Fevers EXAM: PORTABLE CHEST 1 VIEW COMPARISON:  05/16/2023 CT FINDINGS: Cardiac shadow is within normal limits. Lungs are well aerated bilaterally. Postsurgical changes are seen. Right chest wall port is noted in satisfactory position. No bony abnormality is noted. IMPRESSION: No active disease. Electronically Signed   By: Alcide Clever M.D.   On: 06/27/2023 00:06    IMPRESSION:  68 year old male with bladder cancer undergoing radiation therapy and history of severe AS status post bioprosthetic AVR and ascending aorta replacement with history of septic shock in December 2024 presenting with fevers.  Found to be anemic. *Anemia: Presented with hemoglobin of 5.8 g down from around 7.5-8 grams just 12 days ago.  Was heme-negative but he mentions some dark stools.  He had been taking Pepto-Bismol regularly, sometimes 2-3 times a day. S/p two units of PRBCs.  Looks like vitamin B12 levels are somewhat low and it appears that he has just recently been started on vitamin B12 supplements. *History of colon polyps: Last colonoscopy in June 2020 with a 7-year recall for June 2027. *High-grade bladder cancer status post cystoprostatectomy with urostomy in place, receiving radiation treatment.  Supposed to receive chemotherapy, but has been delayed due to his renal dysfunction. *History of septic shock due to Enterococcus faecalis bacteremia in December and completed 4 weeks of IV ampicillin.  Presented with fevers on this  occasion as well.  Elevated white blood cell count of 12.3 K. *Severe AS status post bioprosthetic AVR and ascending aorta replacement *Renal dysfunction: Has an upcoming appointment with nephrology.  PLAN: -Monitor hemoglobin and transfuse further if needed. -Blood cultures are pending. -? EGD possible 3/5 per Dr. Chales Abrahams. -Consider CT scan abdomen and pelvis without contrast for evaluation of complaints of ongoing abdominal pain.   Princella Pellegrini. Zehr  06/27/2023, 8:52 AM    Attending physician's note   I have reviewed the chart and did not examine the patient. Went to see pt in ED- he was in transition. I performed a substantive portion of this encounter, including complete performance of at least one of the key components, in conjunction with the APP. I agree with the Advanced Practitioner's note, impression and recommendations.    Severe anemia with ?melena. Heme neg now. Adm Hb 5.8 (baseline 8) s/p 2U to Hb 8. Neg CT AP. Neg colon June 2020 except for small polyps (rpt 7 yrs).  Bladder Ca undergoing XRT, s/p cystoprostatectomy with ileal conduit.  Bioprosthetic AVR w/t asc aortic graft, CAD, OSA  Adm d/t fever on rocephin. BC+ for Kleb (likely urosepsis). H/O enterococcus septic shock Dec 2024. Neg TEE for SBE.  AKI on CKD3  Plan: -IV protonix -Trend CBC. Keep Hb>7 -EGD in AM with Dr Thea Silversmith.   Edman Circle, MD [facility] [phone]

## 2023-06-27 NOTE — Hospital Course (Signed)
 Luis [name] is a 68 y.o. male with medical history significant for high-grade bladder cancer s/p cystoprostatectomy with urostomy in place receiving radiation treatment, chronic HFpEF, CAD s/p PCI to RCA, severe AS s/p bioprosthetic AVR and ascending aorta replacement, iron deficiency anemia, HTN, HLD, depression/anxiety, OSA who was admitted with fever/SIRS and acute on chronic anemia.

## 2023-06-28 ENCOUNTER — Encounter (HOSPITAL_COMMUNITY)
Admission: EM | Disposition: A | Discharge status: Home / Self Care | Payer: Self-pay | Source: Home / Self Care | Attending: Family Medicine

## 2023-06-28 ENCOUNTER — Inpatient Hospital Stay (HOSPITAL_COMMUNITY): Admitting: Anesthesiology

## 2023-06-28 ENCOUNTER — Other Ambulatory Visit: Payer: Self-pay

## 2023-06-28 ENCOUNTER — Encounter (HOSPITAL_COMMUNITY): Payer: Self-pay | Admitting: Internal Medicine

## 2023-06-28 ENCOUNTER — Ambulatory Visit
Admission: RE | Admit: 2023-06-28 | Discharge: 2023-06-28 | Disposition: A | Discharge status: Home / Self Care | Payer: PPO | Source: Ambulatory Visit | Attending: Radiation Oncology

## 2023-06-28 DIAGNOSIS — K3189 Other diseases of stomach and duodenum: Secondary | ICD-10-CM | POA: Diagnosis not present

## 2023-06-28 DIAGNOSIS — K259 Gastric ulcer, unspecified as acute or chronic, without hemorrhage or perforation: Secondary | ICD-10-CM | POA: Diagnosis not present

## 2023-06-28 DIAGNOSIS — I2510 Atherosclerotic heart disease of native coronary artery without angina pectoris: Secondary | ICD-10-CM | POA: Diagnosis not present

## 2023-06-28 DIAGNOSIS — C678 Malignant neoplasm of overlapping sites of bladder: Secondary | ICD-10-CM | POA: Diagnosis not present

## 2023-06-28 DIAGNOSIS — R6510 Systemic inflammatory response syndrome (SIRS) of non-infectious origin without acute organ dysfunction: Secondary | ICD-10-CM | POA: Diagnosis not present

## 2023-06-28 DIAGNOSIS — I4891 Unspecified atrial fibrillation: Secondary | ICD-10-CM

## 2023-06-28 DIAGNOSIS — D649 Anemia, unspecified: Secondary | ICD-10-CM

## 2023-06-28 DIAGNOSIS — K921 Melena: Secondary | ICD-10-CM

## 2023-06-28 DIAGNOSIS — Z510 Encounter for antineoplastic radiation therapy: Secondary | ICD-10-CM | POA: Diagnosis not present

## 2023-06-28 HISTORY — PX: ESOPHAGOGASTRODUODENOSCOPY: SHX5428

## 2023-06-28 LAB — TYPE AND SCREEN
ABO/RH(D): A NEG
Antibody Screen: NEGATIVE
Unit division: 0
Unit division: 0

## 2023-06-28 LAB — COMPREHENSIVE METABOLIC PANEL
ALT: 37 U/L (ref 0–44)
AST: 30 U/L (ref 15–41)
Albumin: 2.4 g/dL — ABNORMAL LOW (ref 3.5–5.0)
Alkaline Phosphatase: 105 U/L (ref 38–126)
Anion gap: 8 (ref 5–15)
BUN: 33 mg/dL — ABNORMAL HIGH (ref 8–23)
CO2: 22 mmol/L (ref 22–32)
Calcium: 8 mg/dL — ABNORMAL LOW (ref 8.9–10.3)
Chloride: 104 mmol/L (ref 98–111)
Creatinine, Ser: 2.69 mg/dL — ABNORMAL HIGH (ref 0.61–1.24)
GFR, Estimated: 25 mL/min — ABNORMAL LOW (ref >60)
Glucose, Bld: 113 mg/dL — ABNORMAL HIGH (ref 70–99)
Potassium: 4.1 mmol/L (ref 3.5–5.1)
Sodium: 134 mmol/L — ABNORMAL LOW (ref 135–145)
Total Bilirubin: 0.7 mg/dL (ref 0.0–1.2)
Total Protein: 7.4 g/dL (ref 6.5–8.1)

## 2023-06-28 LAB — CBC
HCT: 24.8 % — ABNORMAL LOW (ref 39.0–52.0)
Hemoglobin: 7.6 g/dL — ABNORMAL LOW (ref 13.0–17.0)
MCH: 27.8 pg (ref 26.0–34.0)
MCHC: 30.6 g/dL (ref 30.0–36.0)
MCV: 90.8 fL (ref 80.0–100.0)
Platelets: 148 10*3/uL — ABNORMAL LOW (ref 150–400)
RBC: 2.73 MIL/uL — ABNORMAL LOW (ref 4.22–5.81)
RDW: 16.4 % — ABNORMAL HIGH (ref 11.5–15.5)
WBC: 6.6 10*3/uL (ref 4.0–10.5)
nRBC: 0 % (ref 0.0–0.2)

## 2023-06-28 LAB — RAD ONC ARIA SESSION SUMMARY
Course Elapsed Days: 9
Plan Fractions Treated to Date: 7
Plan Prescribed Dose Per Fraction: 1.8 Gy
Plan Total Fractions Prescribed: 28
Plan Total Prescribed Dose: 50.4 Gy
Reference Point Dosage Given to Date: 12.6 Gy
Reference Point Session Dosage Given: 1.8 Gy
Session Number: 7

## 2023-06-28 LAB — BPAM RBC
Blood Product Expiration Date: 202503262359
Blood Product Expiration Date: 202503292359
ISSUE DATE / TIME: 202503040252
ISSUE DATE / TIME: 202503040559
Unit Type and Rh: 600
Unit Type and Rh: 600

## 2023-06-28 LAB — MAGNESIUM: Magnesium: 2.4 mg/dL (ref 1.7–2.4)

## 2023-06-28 LAB — HEMOGLOBIN AND HEMATOCRIT, BLOOD
HCT: 24.2 % — ABNORMAL LOW (ref 39.0–52.0)
Hemoglobin: 7.6 g/dL — ABNORMAL LOW (ref 13.0–17.0)

## 2023-06-28 LAB — URINE CULTURE

## 2023-06-28 LAB — PHOSPHORUS: Phosphorus: 4.7 mg/dL — ABNORMAL HIGH (ref 2.5–4.6)

## 2023-06-28 SURGERY — EGD (ESOPHAGOGASTRODUODENOSCOPY)
Anesthesia: Monitor Anesthesia Care

## 2023-06-28 MED ORDER — SODIUM CHLORIDE 0.9% FLUSH
10.0000 mL | Freq: Two times a day (BID) | INTRAVENOUS | Status: DC
  Administered 2023-06-28 – 2023-07-02 (×7): 10 mL
  Administered 2023-07-03: 20 mL

## 2023-06-28 MED ORDER — PROPOFOL 10 MG/ML IV BOLUS
INTRAVENOUS | Status: DC | PRN
  Administered 2023-06-28: 30 mg via INTRAVENOUS
  Administered 2023-06-28 (×2): 20 mg via INTRAVENOUS

## 2023-06-28 MED ORDER — PROPOFOL 500 MG/50ML IV EMUL
INTRAVENOUS | Status: DC | PRN
  Administered 2023-06-28: 135 ug/kg/min via INTRAVENOUS

## 2023-06-28 MED ORDER — SODIUM CHLORIDE 0.9 % IV SOLN: INTRAVENOUS | Status: DC | PRN

## 2023-06-28 MED ORDER — CHLORHEXIDINE GLUCONATE CLOTH 2 % EX PADS
6.0000 | MEDICATED_PAD | Freq: Every day | CUTANEOUS | Status: DC
  Administered 2023-06-28 – 2023-07-03 (×6): 6 via TOPICAL

## 2023-06-28 MED ORDER — SODIUM CHLORIDE 0.9% FLUSH: 10.0000 mL | INTRAVENOUS | Status: DC | PRN

## 2023-06-28 MED ORDER — PROPOFOL 1000 MG/100ML IV EMUL
INTRAVENOUS | Status: AC
Start: 2023-06-28 — End: ?
  Filled 2023-06-28: qty 100

## 2023-06-28 NOTE — Op Note (Signed)
 [facility] Patient Name: Luis [name] Procedure Date: 06/28/2023 MRN: [id number] Attending MD: Lynann Bologna , MD, [id number] Date of Birth: 1955/10/04 CSN: [id number] Age: 67 Admit Type: Inpatient Procedure:                Upper GI endoscopy Indications:              Severe anemia with ?melena. Heme neg now. Adm Hb                            5.8 (baseline 8) s/p 2U to Hb 8. Neg CT AP. Neg                            colon June 2020 except for small polyps (rpt 7 yrs). Providers:                Lynann Bologna, MD, Marge Duncans, RN, Alan Ripper,                            Technician Referring MD:              Medicines:                Monitored Anesthesia Care Complications:            No immediate complications. Estimated Blood Loss:     Estimated blood loss: none. Procedure:                Pre-Anesthesia Assessment:                           - Prior to the procedure, a History and Physical                            was performed, and patient medications and                            allergies were reviewed. The patient's tolerance of                            previous anesthesia was also reviewed. The risks                            and benefits of the procedure and the sedation                            options and risks were discussed with the patient.                            All questions were answered, and informed consent                            was obtained. Prior Anticoagulants: The patient has                            taken no anticoagulant or antiplatelet agents. ASA  Grade Assessment: III - A patient with severe                            systemic disease. After reviewing the risks and                            benefits, the patient was deemed in satisfactory                            condition to undergo the procedure.                           After obtaining informed consent, the endoscope was                             passed under direct vision. Throughout the                            procedure, the patient's blood pressure, pulse, and                            oxygen saturations were monitored continuously. The                            [id number]-H190 ([id number]) Olympus endoscope was introduced                            through the mouth, and advanced to the second part                            of duodenum. The upper GI endoscopy was                            accomplished without difficulty. The patient                            tolerated the procedure well. Scope In: Scope Out: Findings:      The examined esophagus was normal.      The Z-line was regular.      Three non-bleeding cratered gastric ulcers with no stigmata of bleeding       were found in the gastric antrum. The largest lesion was 6 mm in largest       dimension. Biopsies were taken with a cold forceps for histology.      The examined duodenum was normal. Impression:               - Non-bleeding gastric ulcers with no stigmata of                            bleeding. Biopsied.                           - No UGI bleding Moderate Sedation:      Not Applicable - Patient had care per Anesthesia. Recommendation:           -  Resume previous diet.                           - Continue present medications including Protonix                            40 mg p.o. daily.                           - Await pathology results.                           - Trend CBC. Transfuse to keep Hb>7                           - Will sign off for now.                           - The findings and recommendations were discussed                            with the patient. Procedure Code(s):        --- Professional ---                           [phone], Esophagogastroduodenoscopy, flexible,                            transoral; with biopsy, single or multiple Diagnosis Code(s):        --- Professional ---                           K25.9, Gastric ulcer,  unspecified as acute or                            chronic, without hemorrhage or perforation                           K92.1, Melena (includes Hematochezia) CPT copyright 2022 [employer]. All rights reserved. The codes documented in this report are preliminary and upon coder review may  be revised to meet current compliance requirements. Lynann Bologna, MD 06/28/2023 2:29:09 PM This report has been signed electronically. Number of Addenda: 0

## 2023-06-28 NOTE — Progress Notes (Signed)
Pt. Places cpap on himself. 

## 2023-06-28 NOTE — Progress Notes (Signed)
   06/28/23 0008  BiPAP/CPAP/SIPAP  $ Non-Invasive Home Ventilator  Initial  $ Face Mask Medium Yes  BiPAP/CPAP/SIPAP Pt Type Adult  BiPAP/CPAP/SIPAP DREAMSTATIOND  Mask Type Full face mask  Dentures removed? Not applicable  Mask Size Medium  Respiratory Rate 18 breaths/min  Patient Home Equipment No  Auto Titrate Yes (5-20)

## 2023-06-28 NOTE — Plan of Care (Signed)
  Problem: Fluid Volume: Goal: Hemodynamic stability will improve Outcome: Progressing   Problem: Clinical Measurements: Goal: Diagnostic test results will improve Outcome: Progressing   Problem: Respiratory: Goal: Ability to maintain adequate ventilation will improve Outcome: Progressing   Problem: Education: Goal: Knowledge of General Education information will improve Description: Including pain rating scale, medication(s)/side effects and non-pharmacologic comfort measures Outcome: Progressing   Problem: Clinical Measurements: Goal: Ability to maintain clinical measurements within normal limits will improve Outcome: Progressing   Problem: Activity: Goal: Risk for activity intolerance will decrease Outcome: Progressing   Problem: Elimination: Goal: Will not experience complications related to bowel motility Outcome: Progressing Goal: Will not experience complications related to urinary retention Outcome: Progressing   Problem: Pain Managment: Goal: General experience of comfort will improve and/or be controlled Outcome: Progressing

## 2023-06-28 NOTE — Anesthesia Procedure Notes (Signed)
 Procedure Name: MAC Date/Time: 06/28/2023 2:01 PM  Performed by: Nelle Don, CRNAPre-anesthesia Checklist: Patient identified, Suction available, Emergency Drugs available and Patient being monitored Oxygen Delivery Method: Simple face mask

## 2023-06-28 NOTE — Plan of Care (Signed)

## 2023-06-28 NOTE — Plan of Care (Signed)
  Problem: Nutrition: Goal: Adequate nutrition will be maintained Outcome: Progressing   Problem: Safety: Goal: Ability to remain free from injury will improve  Problem: Clinical Measurements: Goal: Will remain free from infection Outcome: Progressing

## 2023-06-28 NOTE — Anesthesia Preprocedure Evaluation (Addendum)
 Anesthesia Evaluation  Patient identified by MRN, date of birth, ID band Patient awake    Reviewed: Allergy & Precautions, NPO status , Patient's Chart, lab work & pertinent test results  History of Anesthesia Complications (+) history of anesthetic complications (difficulty waking up after gallbladder surgery ("wild"))  Airway Mallampati: III  TM Distance: >3 FB Neck ROM: Full   Comment: Previous grade I view with MAC 4, easy mask Dental  (+) Dental Advisory Given, Edentulous Lower, Missing, Poor Dentition Only has 3 teeth, all on the top. Denies them being loose.:   Pulmonary neg shortness of breath, sleep apnea and Continuous Positive Airway Pressure Ventilation , neg COPD, neg recent URI, former smoker   Pulmonary exam normal breath sounds clear to auscultation       Cardiovascular hypertension, Pt. on home beta blockers and Pt. on medications (-) angina + CAD, + Past MI (2012), + Cardiac Stents (2012) and +CHF  + dysrhythmias (postop from AVR) Atrial Fibrillation + Valvular Problems/Murmurs (s/p AVR 09/16/2021) AS  Rhythm:Regular Rate:Normal + Systolic murmurs HLD, ascending aortic aneurysm, carotid stenosis, s/p pericardial window 09/28/2021  RHC 06/15/2023: 1. Normal PCWP, elevated RA pressure.  R>L heart failure. 2. Normal (high) CO 3. Preserved PAPi  TEE 04/17/2023:  1. Left ventricular ejection fraction, by estimation, is 60 to 65%. The left ventricle has normal function. The left ventricle has no regional wall motion abnormalities.   2. Right ventricular systolic function is normal. The right ventricular size is normal.   3. No left atrial/left atrial appendage thrombus was detected.   4. The mitral valve is normal in structure. Mild mitral valve regurgitation. No evidence of mitral stenosis.   5. The aortic valve has been repaired/replaced. Aortic valve regurgitation is not visualized. No aortic stenosis is present. There is  a 25 mm Edwards Inspiris Resilia valve present in the aortic position. Echo findings are consistent with normal  structure and function of the aortic valve prosthesis.   6. Aortic root/ascending aorta has been repaired/replaced.   7. The inferior vena cava is normal in size with greater than 50% respiratory variability, suggesting right atrial pressure of 3 mmHg.     Neuro/Psych neg Seizures PSYCHIATRIC DISORDERS Anxiety Depression    vertigo    GI/Hepatic Neg liver ROS,GERD  ,,  Endo/Other  neg diabetes  Class 3 obesityPre-diabetes  Renal/GU ARF, CRF and Renal InsufficiencyRenal disease   Bladder cancer    Musculoskeletal  (+) Arthritis ,    Abdominal  (+) + obese  Peds  Hematology  (+) Blood dyscrasia, anemia Lab Results      Component                Value               Date                      WBC                      6.6                 06/28/2023                HGB                      7.6 (L)             06/28/2023  HCT                      24.8 (L)            06/28/2023                MCV                      90.8                06/28/2023                PLT                      148 (L)             06/28/2023              Anesthesia Other Findings Blood culture positive for Klebsiella  Reproductive/Obstetrics                             Anesthesia Physical Anesthesia Plan  ASA: 4  Anesthesia Plan: MAC   Post-op Pain Management: Minimal or no pain anticipated   Induction: Intravenous  PONV Risk Score and Plan: 1 and Treatment may vary due to age or medical condition, Propofol infusion and TIVA  Airway Management Planned: Natural Airway and Nasal Cannula  Additional Equipment:   Intra-op Plan:   Post-operative Plan:   Informed Consent: I have reviewed the patients History and Physical, chart, labs and discussed the procedure including the risks, benefits and alternatives for the proposed anesthesia with the  patient or authorized representative who has indicated his/her understanding and acceptance.     Dental advisory given  Plan Discussed with: CRNA  Anesthesia Plan Comments: (Discussed with patient risks of MAC including, but not limited to, minor pain or discomfort, hearing people in the room, and possible need for backup general anesthesia. Risks for general anesthesia also discussed including, but not limited to, sore throat, hoarse voice, chipped/damaged teeth, injury to vocal cords, nausea and vomiting, allergic reactions, lung infection, heart attack, stroke, and death. All questions answered.  )        Anesthesia Quick Evaluation

## 2023-06-28 NOTE — Progress Notes (Signed)
 PROGRESS NOTE    LEONTAE [name]  QMV:[id number] DOB: 06/19/1955 DOA: 06/26/2023 PCP: Nelwyn Salisbury, MD  Chief Complaint  Patient presents with   Fever    Brief Narrative:   68 year old male with history of high-grade bladder cancer status post cystoprostatectomy with urostomy in place, currently undergoing radiation therapy started Ashur Glatfelter week prior to admission, chronic diastolic CHF, CAD with PCI to RCA, severe AAS status post bioprosthetic AVR and ascending aorta replacement, HTN, HLD, OSA on CPAP who comes into the hospital with fevers over the last 3 to 4 days.  Patient has Luis [name] recent history of septic shock due to Enterococcus bacteremia in December 2024, completed several weeks of IV ampicillin, and at that time there was no evidence of TEE and no infection of his aortic graft on the CT scan of the chest.  Current symptoms remind him of his prior episode and he decided to seek care.  In the ED he was febrile, but importantly was also found to be anemic with [name] hemoglobin in the 5 range.  Patient is reported dark stools intermittently over the last couple of weeks   Assessment & Plan:   Principal Problem:   SIRS (systemic inflammatory response syndrome) (HCC) Active Problems:   Acute on chronic anemia   Renal dysfunction   CAD (coronary artery disease)   Hyperlipidemia   OSA (obstructive sleep apnea)   Chronic diastolic CHF (congestive heart failure) (HCC)   Essential hypertension   Bladder cancer (HCC)   History of aortic valve replacement with tissue graft  Sepsis due to Klebsiella Bacteremia Presented with fever, leukocytosis, and now with klebsiella bacteremia CT abdomen/pelvis with mild bilateral hydroureteronephrosis (cystectomy with ileal conduit placement and prostatectomy) UA could be c/w with UTI, but not overwhelmingly so -> urine culture multiple species Most likely source would seem to be urinary Follow final culture results and adjust abx based on sensitivities Will  discuss with ID given his recurrent bacteremia  Rhinovirus Infection Seems mild, follow  Anemia, concern for melena -patient reports several weeks of intermittent melena, currently he is without frank bleeding and fecal occult is negative.   -Gastroenterology consulted, appreciate input - > s/p EGD with nonbleeding gastric ulcers without stigmata of bleeding (biopsied), continued PPI daily - follow pathology Transfuse as needed or for Hb <7   High-grade bladder cancer-status post total cystectomy with ileal conduit as well as prostatectomy-follows with oncology Dr. [name], recently started radiation therapy [name] week prior to this admission and has completed 6 cycles.   Acute kidney injury on CKD stage IV-baseline creatinine between 2 and 2.3, on admission it was 2.7 in the setting of febrile illness.  Creatinine relatively stable Will trend, mild hydro on imaging Consider discussion with urology if not improving  Hyponatremia -monitor, in the setting of CKD, Lasix   Chronic diastolic CHF-continue Coreg, hold Lasix for now as he is febrile and normotensive   Hyperlipidemia-continue Crestor   Essential hypertension-continue amlodipine and carvedilol, hold Lasix as above   CAD-with history of PCI to RCA, stable, denies chest pain.  Holding aspirin with his anemia.  Continue statin   OSA on CPAP-continue CPAP   Status post bioprosthetic AVR, ascending aorta replacement -noted, will watch blood cultures and repeat imaging if he is bacteremic again    DVT prophylaxis: SCD Code Status: full Family Communication: wife at bedside Disposition:   Status is: Inpatient Remains inpatient appropriate because: need for follow up culture results, ongoing care   Consultants:  GI  Procedures:  EGD  Antimicrobials:  Anti-infectives (From admission, onward)    Start     Dose/Rate Route Frequency Ordered Stop   06/27/23 1700  cefTRIAXone (ROCEPHIN) 2 g in sodium chloride 0.9 % 100 mL IVPB   Status:  Discontinued        2 g 200 mL/hr over 30 Minutes Intravenous Daily-1600 06/27/23 1613 06/27/23 1613   06/27/23 1700  cefTRIAXone (ROCEPHIN) 2 g in sodium chloride 0.9 % 100 mL IVPB        2 g 200 mL/hr over 30 Minutes Intravenous Daily-1600 06/27/23 1614     06/26/23 2330  Ampicillin-Sulbactam (UNASYN) 3 g in sodium chloride 0.9 % 100 mL IVPB  Status:  Discontinued        3 g 200 mL/hr over 30 Minutes Intravenous Every 8 hours 06/26/23 2318 06/27/23 1613       Subjective: No new complaints  Objective: Vitals:   06/28/23 1426 06/28/23 1430 06/28/23 1440 06/28/23 1501  BP: (!) 105/50 (!) 109/49 (!) 132/51 135/67  Pulse: (!) 52 (!) 53 (!) 54 (!) 53  Resp: (!) 23 (!) 24 20 17   Temp: 97.6 F (36.4 C)   98.6 F (37 C)  TempSrc: Temporal     SpO2: 99% 97% 97% 100%  Weight:      Height:        Intake/Output Summary (Last 24 hours) at 06/28/2023 1634 Last data filed at 06/28/2023 1423 Gross per 24 hour  Intake 488.56 ml  Output 1800 ml  Net -1311.44 ml   Filed Weights   06/27/23 0130 06/28/23 1316  Weight: 112 kg 112 kg    Examination:  General exam: Appears calm and comfortable  Respiratory system: unlabored Cardiovascular system:RRR Gastrointestinal system: Abdomen is nondistended, soft and nontender. urostomy Central nervous system: Alert and oriented. No focal neurological deficits. Extremities: no lee    Data Reviewed: I have personally reviewed following labs and imaging studies  CBC: Recent Labs  Lab 06/26/23 2054 06/26/23 2100 06/27/23 1105 06/28/23 0500  WBC  --  12.3* 9.0 6.6  NEUTROABS  --  10.9*  --   --   HGB 8.2* 5.8* 8.1* 7.6*  HCT 24.0* 18.0* 25.3* 24.8*  MCV  --  89.1 87.5 90.8  PLT  --  252 181 148*    Basic Metabolic Panel: Recent Labs  Lab 06/26/23 2054 06/26/23 2100 06/27/23 1105 06/28/23 0500  NA 133* 129* 134* 134*  K 5.9* 4.4 4.4 4.1  CL 106 101 105 104  CO2  --  18* 20* 22  GLUCOSE 172* 191* 162* 113*  BUN 40*  30* 32* 33*  CREATININE 2.70* 2.26* 2.59* 2.69*  CALCIUM  --  8.0* 7.9* 8.0*  MG  --   --   --  2.4  PHOS  --   --   --  4.7*    GFR: Estimated Creatinine Clearance: 30.5 mL/min (Sydnei Ohaver) (by C-G formula based on SCr of 2.69 mg/dL (H)).  Liver Function Tests: Recent Labs  Lab 06/26/23 2100 06/27/23 1105 06/28/23 0500  AST 32 29 30  ALT 27 29 37  ALKPHOS 101 100 105  BILITOT 0.8 1.2 0.7  PROT 7.8 7.4 7.4  ALBUMIN 2.8* 2.5* 2.4*    CBG: No results for input(s): "GLUCAP" in the last 168 hours.   Recent Results (from the past 240 hours)  Resp panel by RT-PCR (RSV, Flu Caren Garske&B, Covid)     Status: None   Collection Time: 06/26/23  9:00 PM  Specimen: Nasal Swab  Result Value Ref Range Status   SARS Coronavirus 2 by RT PCR NEGATIVE NEGATIVE Final    Comment: (NOTE) SARS-CoV-2 target nucleic acids are NOT DETECTED.  The SARS-CoV-2 RNA is generally detectable in upper respiratory specimens during the acute phase of infection. The lowest concentration of SARS-CoV-2 viral copies this assay can detect is 138 copies/mL. Likisha Alles negative result does not preclude SARS-Cov-2 infection and should not be used as the sole basis for treatment or other patient management decisions. Augustine Brannick negative result may occur with  improper specimen collection/handling, submission of specimen other than nasopharyngeal swab, presence of viral mutation(s) within the areas targeted by this assay, and inadequate number of viral copies(<138 copies/mL). Gryffin Altice negative result must be combined with clinical observations, patient history, and epidemiological information. The expected result is Negative.  Fact Sheet for Patients:  BloggerCourse.com  Fact Sheet for Healthcare Providers:  SeriousBroker.it  This test is no t yet approved or cleared by the [place] FDA and  has been authorized for detection and/or diagnosis of SARS-CoV-2 by FDA under an Emergency Use  Authorization (EUA). This EUA will remain  in effect (meaning this test can be used) for the duration of the COVID-19 declaration under Section 564(b)(1) of the Act, 21 U.S.C.section 360bbb-3(b)(1), unless the authorization is terminated  or revoked sooner.       Influenza Vear Staton by PCR NEGATIVE NEGATIVE Final   Influenza B by PCR NEGATIVE NEGATIVE Final    Comment: (NOTE) The Xpert Xpress SARS-CoV-2/FLU/RSV plus assay is intended as an aid in the diagnosis of influenza from Nasopharyngeal swab specimens and should not be used as Lain Tetterton sole basis for treatment. Nasal washings and aspirates are unacceptable for Xpert Xpress SARS-CoV-2/FLU/RSV testing.  Fact Sheet for Patients: BloggerCourse.com  Fact Sheet for Healthcare Providers: SeriousBroker.it  This test is not yet approved or cleared by the [place] FDA and has been authorized for detection and/or diagnosis of SARS-CoV-2 by FDA under an Emergency Use Authorization (EUA). This EUA will remain in effect (meaning this test can be used) for the duration of the COVID-19 declaration under Section 564(b)(1) of the Act, 21 U.S.C. section 360bbb-3(b)(1), unless the authorization is terminated or revoked.     Resp Syncytial Virus by PCR NEGATIVE NEGATIVE Final    Comment: (NOTE) Fact Sheet for Patients: BloggerCourse.com  Fact Sheet for Healthcare Providers: SeriousBroker.it  This test is not yet approved or cleared by the [place] FDA and has been authorized for detection and/or diagnosis of SARS-CoV-2 by FDA under an Emergency Use Authorization (EUA). This EUA will remain in effect (meaning this test can be used) for the duration of the COVID-19 declaration under Section 564(b)(1) of the Act, 21 U.S.C. section 360bbb-3(b)(1), unless the authorization is terminated or revoked.  Performed at [facility],  [address] [place], [place] [address]   Blood Culture (routine x 2)     Status: Abnormal (Preliminary result)   Collection Time: 06/26/23  9:00 PM   Specimen: BLOOD  Result Value Ref Range Status   Specimen Description   Final    BLOOD BLOOD RIGHT HAND Performed at [facility], [address], [place], [place] [address]    Special Requests   Final    BOTTLES DRAWN AEROBIC AND ANAEROBIC Blood Culture results may not be optimal due to an inadequate volume of blood received in culture bottles Performed at [facility], [address] [place], [place] [address]  Culture  Setup Time   Final    GRAM NEGATIVE RODS ANAEROBIC BOTTLE ONLY CRITICAL RESULT CALLED TO, READ BACK BY AND VERIFIED WITH: PHARMD D. [id number] @ 1549 FH    Culture (Lakenzie Mcclafferty)  Final    KLEBSIELLA OXYTOCA SUSCEPTIBILITIES TO FOLLOW Performed at [facility], [address], [place], [place] [address]    Report Status PENDING  Incomplete  Blood Culture (routine x 2)     Status: None (Preliminary result)   Collection Time: 06/26/23  9:00 PM   Specimen: BLOOD LEFT HAND  Result Value Ref Range Status   Specimen Description   Final    BLOOD LEFT HAND Performed at [facility], [address], [place], [place] [address]    Special Requests   Final    BOTTLES DRAWN AEROBIC AND ANAEROBIC Blood Culture results may not be optimal due to an inadequate volume of blood received in culture bottles Performed at [facility], [address], [place], [place] [address]    Culture   Final    NO GROWTH 2 DAYS Performed at [facility], [address] [place], [place] [address]    Report Status PENDING  Incomplete  Blood Culture ID Panel (Reflexed)     Status: Abnormal   Collection Time: 06/26/23  9:00 PM  Result Value Ref Range Status   Enterococcus faecalis NOT DETECTED NOT DETECTED Final   Enterococcus Faecium NOT DETECTED NOT DETECTED Final    Listeria monocytogenes NOT DETECTED NOT DETECTED Final   Staphylococcus species NOT DETECTED NOT DETECTED Final   Staphylococcus aureus (BCID) NOT DETECTED NOT DETECTED Final   Staphylococcus epidermidis NOT DETECTED NOT DETECTED Final   Staphylococcus lugdunensis NOT DETECTED NOT DETECTED Final   Streptococcus species NOT DETECTED NOT DETECTED Final   Streptococcus agalactiae NOT DETECTED NOT DETECTED Final   Streptococcus pneumoniae NOT DETECTED NOT DETECTED Final   Streptococcus pyogenes NOT DETECTED NOT DETECTED Final   Loys Shugars.calcoaceticus-baumannii NOT DETECTED NOT DETECTED Final   Bacteroides fragilis NOT DETECTED NOT DETECTED Final   Enterobacterales DETECTED (Jonique Kulig) NOT DETECTED Final    Comment: Enterobacterales represent Eleazar Kimmey large order of gram negative bacteria, not Marney Treloar single organism. CRITICAL RESULT CALLED TO, READ BACK BY AND VERIFIED WITH: PHARMD D. WOFFORD [id number] @ 1549 FH    Enterobacter cloacae complex NOT DETECTED NOT DETECTED Final   Escherichia coli NOT DETECTED NOT DETECTED Final   Klebsiella aerogenes NOT DETECTED NOT DETECTED Final   Klebsiella oxytoca DETECTED (Kidus Delman) NOT DETECTED Final    Comment: CRITICAL RESULT CALLED TO, READ BACK BY AND VERIFIED WITH: PHARMD D. OVFIEPP [id number] @ 1549 FH    Klebsiella pneumoniae NOT DETECTED NOT DETECTED Final   Proteus species NOT DETECTED NOT DETECTED Final   Salmonella species NOT DETECTED NOT DETECTED Final   Serratia marcescens NOT DETECTED NOT DETECTED Final   Haemophilus influenzae NOT DETECTED NOT DETECTED Final   Neisseria meningitidis NOT DETECTED NOT DETECTED Final   Pseudomonas aeruginosa NOT DETECTED NOT DETECTED Final   Stenotrophomonas maltophilia NOT DETECTED NOT DETECTED Final   Candida albicans NOT DETECTED NOT DETECTED Final   Candida auris NOT DETECTED NOT DETECTED Final   Candida glabrata NOT DETECTED NOT DETECTED Final   Candida krusei NOT DETECTED NOT DETECTED Final   Candida parapsilosis NOT DETECTED NOT  DETECTED Final   Candida tropicalis NOT DETECTED NOT DETECTED Final   Cryptococcus neoformans/gattii NOT DETECTED NOT DETECTED Final   CTX-M ESBL NOT DETECTED NOT  DETECTED Final   Carbapenem resistance IMP NOT DETECTED NOT DETECTED Final   Carbapenem resistance KPC NOT DETECTED NOT DETECTED Final   Carbapenem resistance NDM NOT DETECTED NOT DETECTED Final   Carbapenem resist OXA 48 LIKE NOT DETECTED NOT DETECTED Final   Carbapenem resistance VIM NOT DETECTED NOT DETECTED Final    Comment: Performed at [facility], [address], [place], [place] [address]  Urine Culture     Status: Abnormal   Collection Time: 06/27/23 12:24 AM   Specimen: Urine, Random  Result Value Ref Range Status   Specimen Description   Final    URINE, RANDOM Performed at [facility], [address], [place], [place] [address]    Special Requests   Final    NONE Reflexed from [id number] Performed at [facility], [address], [place], [place] [address]    Culture MULTIPLE SPECIES PRESENT, SUGGEST RECOLLECTION (Hurshel Bouillon)  Final   Report Status 06/28/2023 FINAL  Final  Respiratory (~20 pathogens) panel by PCR     Status: Abnormal   Collection Time: 06/27/23  6:28 AM   Specimen: Nasopharyngeal Swab; Respiratory  Result Value Ref Range Status   Adenovirus NOT DETECTED NOT DETECTED Final   Coronavirus 229E NOT DETECTED NOT DETECTED Final    Comment: (NOTE) The Coronavirus on the Respiratory Panel, DOES NOT test for the novel  Coronavirus (2019 nCoV)    Coronavirus HKU1 NOT DETECTED NOT DETECTED Final   Coronavirus NL63 NOT DETECTED NOT DETECTED Final   Coronavirus OC43 NOT DETECTED NOT DETECTED Final   Metapneumovirus NOT DETECTED NOT DETECTED Final   Rhinovirus / Enterovirus DETECTED (Kamrie Fanton) NOT DETECTED Final   Influenza Vinay Ertl NOT DETECTED NOT DETECTED Final   Influenza B NOT DETECTED NOT DETECTED Final   Parainfluenza Virus 1 NOT DETECTED NOT DETECTED Final   Parainfluenza  Virus 2 NOT DETECTED NOT DETECTED Final   Parainfluenza Virus 3 NOT DETECTED NOT DETECTED Final   Parainfluenza Virus 4 NOT DETECTED NOT DETECTED Final   Respiratory Syncytial Virus NOT DETECTED NOT DETECTED Final   Bordetella pertussis NOT DETECTED NOT DETECTED Final   Bordetella Parapertussis NOT DETECTED NOT DETECTED Final   Chlamydophila pneumoniae NOT DETECTED NOT DETECTED Final   Mycoplasma pneumoniae NOT DETECTED NOT DETECTED Final    Comment: Performed at [facility], [address], [place], [place] [address]         Radiology Studies: CT ABDOMEN PELVIS WO CONTRAST Result Date: 06/27/2023 CLINICAL DATA:  Fever. EXAM: CT ABDOMEN AND PELVIS WITHOUT CONTRAST TECHNIQUE: Multidetector CT imaging of the abdomen and pelvis was performed following the standard protocol without IV contrast. RADIATION DOSE REDUCTION: This exam was performed according to the departmental dose-optimization program which includes automated exposure control, adjustment of the mA and/or kV according to patient size and/or use of iterative reconstruction technique. COMPARISON:  April 13, 2023. FINDINGS: Lower chest: No acute abnormality. Hepatobiliary: No focal liver abnormality is seen. Status post cholecystectomy. No biliary dilatation. Pancreas: Unremarkable. No pancreatic ductal dilatation or surrounding inflammatory changes. Spleen: Normal in size without focal abnormality. Adrenals/Urinary Tract: Adrenal glands appear normal. Patient is status post cystectomy with ileal conduit placement. Mild bilateral hydroureteronephrosis is noted without obstructing calculus. Stable left renal cysts are noted. Stomach/Bowel: The stomach is unremarkable. There is no evidence of bowel obstruction or inflammation. Vascular/Lymphatic: Aortic atherosclerosis. No enlarged abdominal or pelvic lymph nodes. Reproductive: Status post prostatectomy. Other: No ascites or hernia is noted. Musculoskeletal: No acute or  significant  osseous findings. IMPRESSION: Status post cystectomy with ileal conduit placement, as well as prostatectomy. Mild bilateral hydroureteronephrosis is noted without obstructing calculus. Aortic Atherosclerosis (ICD10-I70.0). Electronically Signed   By: Lupita Raider M.D.   On: 06/27/2023 15:28   DG Chest Port 1 View Result Date: 06/27/2023 CLINICAL DATA:  Fevers EXAM: PORTABLE CHEST 1 VIEW COMPARISON:  05/16/2023 CT FINDINGS: Cardiac shadow is within normal limits. Lungs are well aerated bilaterally. Postsurgical changes are seen. Right chest wall port is noted in satisfactory position. No bony abnormality is noted. IMPRESSION: No active disease. Electronically Signed   By: Alcide Clever M.D.   On: 06/27/2023 00:06        Scheduled Meds:  sodium chloride   Intravenous Once   amLODipine  5 mg Oral Daily   carvedilol  3.125 mg Oral BID WC   Chlorhexidine Gluconate Cloth  6 each Topical Daily   potassium chloride SA  20 mEq Oral Daily   rosuvastatin  20 mg Oral QHS   sodium chloride flush  10-40 mL Intracatheter Q12H   sodium chloride flush  3 mL Intravenous Q12H   venlafaxine XR  150 mg Oral Q breakfast   Continuous Infusions:  cefTRIAXone (ROCEPHIN)  IV Stopped (06/27/23 1723)     LOS: 1 day    Time spent: over 30 min    Lacretia Nicks, MD [facility]   To contact the attending provider between 7A-7P or the covering provider during after hours 7P-7A, please log into the web site [email or web address] and access using universal [place] password for that web site. If you do not have the password, please call the hospital operator.  06/28/2023, 4:34 PM

## 2023-06-28 NOTE — Transfer of Care (Signed)
 Immediate Anesthesia Transfer of Care Note  Patient: Luis [name]  Procedure(s) Performed: EGD (ESOPHAGOGASTRODUODENOSCOPY)  Patient Location: PACU  Anesthesia Type:MAC  Level of Consciousness: drowsy  Airway & Oxygen Therapy: Patient Spontanous Breathing and Patient connected to face mask oxygen  Post-op Assessment: Report given to RN, Post -op Vital signs reviewed and stable, and Patient moving all extremities X 4  Post vital signs: Reviewed and stable  Last Vitals:  Vitals Value Taken Time  BP 105/50   Temp    Pulse 53   Resp 12   SpO2 100     Last Pain:  Vitals:   06/28/23 1316  TempSrc: Tympanic  PainSc: 0-No pain         Complications: No notable events documented.

## 2023-06-28 NOTE — Interval H&P Note (Signed)
 History and Physical Interval Note:  06/28/2023 2:05 PM  Luis [name]  has presented today for surgery, with the diagnosis of Anemia.  The various methods of treatment have been discussed with the patient and family. After consideration of risks, benefits and other options for treatment, the patient has consented to  Procedure(s): EGD (ESOPHAGOGASTRODUODENOSCOPY) (N/A) as a surgical intervention.  The patient's history has been reviewed, patient examined, no change in status, stable for surgery.  I have reviewed the patient's chart and labs.  Questions were answered to the patient's satisfaction.     Lynann Bologna

## 2023-06-29 ENCOUNTER — Ambulatory Visit
Admission: RE | Admit: 2023-06-29 | Discharge: 2023-06-29 | Disposition: A | Discharge status: Home / Self Care | Payer: PPO | Source: Ambulatory Visit | Attending: Radiation Oncology | Admitting: Radiation Oncology

## 2023-06-29 ENCOUNTER — Other Ambulatory Visit: Payer: Self-pay

## 2023-06-29 ENCOUNTER — Ambulatory Visit: Payer: PPO | Admitting: Radiation Oncology

## 2023-06-29 DIAGNOSIS — R6510 Systemic inflammatory response syndrome (SIRS) of non-infectious origin without acute organ dysfunction: Secondary | ICD-10-CM | POA: Diagnosis not present

## 2023-06-29 DIAGNOSIS — Z510 Encounter for antineoplastic radiation therapy: Secondary | ICD-10-CM | POA: Diagnosis not present

## 2023-06-29 DIAGNOSIS — C678 Malignant neoplasm of overlapping sites of bladder: Secondary | ICD-10-CM | POA: Diagnosis not present

## 2023-06-29 LAB — CBC WITH DIFFERENTIAL/PLATELET
Abs Immature Granulocytes: 0.03 10*3/uL (ref 0.00–0.07)
Basophils Absolute: 0 10*3/uL (ref 0.0–0.1)
Basophils Relative: 0 %
Eosinophils Absolute: 0.1 10*3/uL (ref 0.0–0.5)
Eosinophils Relative: 2 %
HCT: 24.2 % — ABNORMAL LOW (ref 39.0–52.0)
Hemoglobin: 7.6 g/dL — ABNORMAL LOW (ref 13.0–17.0)
Immature Granulocytes: 1 %
Lymphocytes Relative: 15 %
Lymphs Abs: 0.8 10*3/uL (ref 0.7–4.0)
MCH: 28.1 pg (ref 26.0–34.0)
MCHC: 31.4 g/dL (ref 30.0–36.0)
MCV: 89.6 fL (ref 80.0–100.0)
Monocytes Absolute: 0.5 10*3/uL (ref 0.1–1.0)
Monocytes Relative: 9 %
Neutro Abs: 4.1 10*3/uL (ref 1.7–7.7)
Neutrophils Relative %: 73 %
Platelets: 159 10*3/uL (ref 150–400)
RBC: 2.7 MIL/uL — ABNORMAL LOW (ref 4.22–5.81)
RDW: 16.6 % — ABNORMAL HIGH (ref 11.5–15.5)
WBC: 5.6 10*3/uL (ref 4.0–10.5)
nRBC: 0 % (ref 0.0–0.2)

## 2023-06-29 LAB — RAD ONC ARIA SESSION SUMMARY
Course Elapsed Days: 10
Plan Fractions Treated to Date: 8
Plan Prescribed Dose Per Fraction: 1.8 Gy
Plan Total Fractions Prescribed: 28
Plan Total Prescribed Dose: 50.4 Gy
Reference Point Dosage Given to Date: 14.4 Gy
Reference Point Session Dosage Given: 1.8 Gy
Session Number: 8

## 2023-06-29 LAB — PHOSPHORUS: Phosphorus: 4.8 mg/dL — ABNORMAL HIGH (ref 2.5–4.6)

## 2023-06-29 LAB — COMPREHENSIVE METABOLIC PANEL
ALT: 59 U/L — ABNORMAL HIGH (ref 0–44)
AST: 57 U/L — ABNORMAL HIGH (ref 15–41)
Albumin: 2.4 g/dL — ABNORMAL LOW (ref 3.5–5.0)
Alkaline Phosphatase: 159 U/L — ABNORMAL HIGH (ref 38–126)
Anion gap: 11 (ref 5–15)
BUN: 39 mg/dL — ABNORMAL HIGH (ref 8–23)
CO2: 19 mmol/L — ABNORMAL LOW (ref 22–32)
Calcium: 8.2 mg/dL — ABNORMAL LOW (ref 8.9–10.3)
Chloride: 106 mmol/L (ref 98–111)
Creatinine, Ser: 3.01 mg/dL — ABNORMAL HIGH (ref 0.61–1.24)
GFR, Estimated: 22 mL/min — ABNORMAL LOW (ref >60)
Glucose, Bld: 117 mg/dL — ABNORMAL HIGH (ref 70–99)
Potassium: 3.9 mmol/L (ref 3.5–5.1)
Sodium: 136 mmol/L (ref 135–145)
Total Bilirubin: 0.5 mg/dL (ref 0.0–1.2)
Total Protein: 7.3 g/dL (ref 6.5–8.1)

## 2023-06-29 LAB — CULTURE, BLOOD (ROUTINE X 2)

## 2023-06-29 LAB — MAGNESIUM: Magnesium: 2.2 mg/dL (ref 1.7–2.4)

## 2023-06-29 LAB — SURGICAL PATHOLOGY

## 2023-06-29 MED ORDER — AMOXICILLIN-POT CLAVULANATE 500-125 MG PO TABS
1.0000 | ORAL_TABLET | Freq: Two times a day (BID) | ORAL | Status: DC
Start: 2023-06-29 — End: 2023-07-01
  Administered 2023-06-29 – 2023-07-01 (×4): 1 via ORAL
  Filled 2023-06-29 (×5): qty 1

## 2023-06-29 MED ORDER — LACTATED RINGERS IV SOLN: INTRAVENOUS | Status: AC

## 2023-06-29 MED ORDER — PANTOPRAZOLE SODIUM 40 MG PO TBEC
40.0000 mg | DELAYED_RELEASE_TABLET | Freq: Every day | ORAL | Status: DC
  Administered 2023-06-29 – 2023-07-03 (×5): 40 mg via ORAL
  Filled 2023-06-29 (×5): qty 1

## 2023-06-29 NOTE — Progress Notes (Signed)
 PROGRESS NOTE    Luis [name]  ZOX:[id number] DOB: 07-27-1955 DOA: 06/26/2023 PCP: Nelwyn Salisbury, MD  Chief Complaint  Patient presents with   Fever    Brief Narrative:   68 year old male with history of high-grade bladder cancer status post cystoprostatectomy with urostomy in place, currently undergoing radiation therapy started Leone Putman week prior to admission, chronic diastolic CHF, CAD with PCI to RCA, severe AAS status post bioprosthetic AVR and ascending aorta replacement, HTN, HLD, OSA on CPAP who comes into the hospital with fevers over the last 3 to 4 days.  Patient has Shondra Capps recent history of septic shock due to Enterococcus bacteremia in December 2024, completed several weeks of IV ampicillin, and at that time there was no evidence of TEE and no infection of his aortic graft on the CT scan of the chest.  Current symptoms remind him of his prior episode and he decided to seek care.  In the ED he was febrile, but importantly was also found to be anemic with Aryanah Enslow hemoglobin in the 5 range.  Patient is reported dark stools intermittently over the last couple of weeks   Assessment & Plan:   Principal Problem:   SIRS (systemic inflammatory response syndrome) (HCC) Active Problems:   Acute on chronic anemia   Renal dysfunction   CAD (coronary artery disease)   Hyperlipidemia   OSA (obstructive sleep apnea)   Chronic diastolic CHF (congestive heart failure) (HCC)   Essential hypertension   Bladder cancer (HCC)   History of aortic valve replacement with tissue graft  Sepsis due to Klebsiella Bacteremia Presented with fever, leukocytosis, and now with klebsiella bacteremia CT abdomen/pelvis with mild bilateral hydroureteronephrosis (cystectomy with ileal conduit placement and prostatectomy) UA could be c/w with UTI, but not overwhelmingly so -> urine culture multiple species Most likely source would seem to be urinary Follow final culture results and adjust abx based on sensitivities ->  transition to augmentin Will discuss with ID given his recurrent bacteremia  Acute kidney injury on CKD stage IV-baseline creatinine between 2 and 2.3, on admission it was 2.7 in the setting of febrile illness.  Creatinine worse today to 3  Will trend, mild hydro on imaging IVF Appreciate urology assistance -> foley placed to allow urine to drain, may need repeat imaging (loopogram or nephrostomy tubes with antegrade nephrostograms)  Rhinovirus Infection Seems mild, follow  Anemia, concern for melena -patient reports several weeks of intermittent melena, currently he is without frank bleeding and fecal occult is negative.   -Gastroenterology consulted, appreciate input - > s/p EGD with nonbleeding gastric ulcers without stigmata of bleeding (biopsied), continued PPI daily - follow pathology Transfuse as needed or for Hb <7   High-grade bladder cancer-status post total cystectomy with ileal conduit as well as prostatectomy-follows with oncology Dr. [name], recently started radiation therapy Dnaiel Voller week prior to this admission and has completed 6 cycles.  Hyponatremia -monitor, in the setting of CKD, Lasix   Chronic diastolic CHF-continue Coreg, hold Lasix for now as he is febrile and normotensive   Hyperlipidemia-continue Crestor   Essential hypertension-continue amlodipine and carvedilol, hold Lasix as above   CAD-with history of PCI to RCA, stable, denies chest pain.  Holding aspirin with his anemia.  Continue statin   OSA on CPAP-continue CPAP   Status post bioprosthetic AVR, ascending aorta replacement -noted, will watch blood cultures and repeat imaging if he is bacteremic again    DVT prophylaxis: SCD Code Status: full Family Communication: wife at bedside Disposition:  Status is: Inpatient Remains inpatient appropriate because: need for follow up culture results, ongoing care   Consultants:  GI  Procedures:  EGD  Antimicrobials:  Anti-infectives (From admission,  onward)    Start     Dose/Rate Route Frequency Ordered Stop   06/27/23 1700  cefTRIAXone (ROCEPHIN) 2 g in sodium chloride 0.9 % 100 mL IVPB  Status:  Discontinued        2 g 200 mL/hr over 30 Minutes Intravenous Daily-1600 06/27/23 1613 06/27/23 1613   06/27/23 1700  cefTRIAXone (ROCEPHIN) 2 g in sodium chloride 0.9 % 100 mL IVPB        2 g 200 mL/hr over 30 Minutes Intravenous Daily-1600 06/27/23 1614     06/26/23 2330  Ampicillin-Sulbactam (UNASYN) 3 g in sodium chloride 0.9 % 100 mL IVPB  Status:  Discontinued        3 g 200 mL/hr over 30 Minutes Intravenous Every 8 hours 06/26/23 2318 06/27/23 1613       Subjective: No new complaints  Objective: Vitals:   06/28/23 2009 06/29/23 0518 06/29/23 0528 06/29/23 1157  BP: 120/64 99/63 115/67 118/70  Pulse: 61 (!) 54 (!) 55 (!) 53  Resp: 18 16 20    Temp: 98.4 F (36.9 C)  98.5 F (36.9 C) 98.6 F (37 C)  TempSrc: Oral  Oral Oral  SpO2: 100% 100% 99% 100%  Weight:      Height:        Intake/Output Summary (Last 24 hours) at 06/29/2023 1506 Last data filed at 06/29/2023 0900 Gross per 24 hour  Intake 720 ml  Output 500 ml  Net 220 ml   Filed Weights   06/27/23 0130 06/28/23 1316  Weight: 112 kg 112 kg    Examination:  General: No acute distress. Sitting in recliner Cardiovascular: RRR Lungs: unlabored Abdomen: Soft, nontender, nondistended Neurological: Alert and oriented 3. Moves all extremities 4 with equal strength. Cranial nerves II through XII grossly intact. Extremities: No clubbing or cyanosis. No edema.   Data Reviewed: I have personally reviewed following labs and imaging studies  CBC: Recent Labs  Lab 06/26/23 2100 06/27/23 1105 06/28/23 0500 06/28/23 1940 06/29/23 0423  WBC 12.3* 9.0 6.6  --  5.6  NEUTROABS 10.9*  --   --   --  4.1  HGB 5.8* 8.1* 7.6* 7.6* 7.6*  HCT 18.0* 25.3* 24.8* 24.2* 24.2*  MCV 89.1 87.5 90.8  --  89.6  PLT 252 181 148*  --  159    Basic Metabolic Panel: Recent Labs   Lab 06/26/23 2054 06/26/23 2100 06/27/23 1105 06/28/23 0500 06/29/23 0423  NA 133* 129* 134* 134* 136  K 5.9* 4.4 4.4 4.1 3.9  CL 106 101 105 104 106  CO2  --  18* 20* 22 19*  GLUCOSE 172* 191* 162* 113* 117*  BUN 40* 30* 32* 33* 39*  CREATININE 2.70* 2.26* 2.59* 2.69* 3.01*  CALCIUM  --  8.0* 7.9* 8.0* 8.2*  MG  --   --   --  2.4 2.2  PHOS  --   --   --  4.7* 4.8*    GFR: Estimated Creatinine Clearance: 27.2 mL/min (Lamari Youngers) (by C-G formula based on SCr of 3.01 mg/dL (H)).  Liver Function Tests: Recent Labs  Lab 06/26/23 2100 06/27/23 1105 06/28/23 0500 06/29/23 0423  AST 32 29 30 57*  ALT 27 29 37 59*  ALKPHOS 101 100 105 159*  BILITOT 0.8 1.2 0.7 0.5  PROT 7.8 7.4 7.4 7.3  ALBUMIN 2.8* 2.5* 2.4* 2.4*    CBG: No results for input(s): "GLUCAP" in the last 168 hours.   Recent Results (from the past 240 hours)  Resp panel by RT-PCR (RSV, Flu Alysse Rathe&B, Covid)     Status: None   Collection Time: 06/26/23  9:00 PM   Specimen: Nasal Swab  Result Value Ref Range Status   SARS Coronavirus 2 by RT PCR NEGATIVE NEGATIVE Final    Comment: (NOTE) SARS-CoV-2 target nucleic acids are NOT DETECTED.  The SARS-CoV-2 RNA is generally detectable in upper respiratory specimens during the acute phase of infection. The lowest concentration of SARS-CoV-2 viral copies this assay can detect is 138 copies/mL. Acea Yagi negative result does not preclude SARS-Cov-2 infection and should not be used as the sole basis for treatment or other patient management decisions. Keena Dinse negative result may occur with  improper specimen collection/handling, submission of specimen other than nasopharyngeal swab, presence of viral mutation(s) within the areas targeted by this assay, and inadequate number of viral copies(<138 copies/mL). Isaiahs Chancy negative result must be combined with clinical observations, patient history, and epidemiological information. The expected result is Negative.  Fact Sheet for Patients:   BloggerCourse.com  Fact Sheet for Healthcare Providers:  SeriousBroker.it  This test is no t yet approved or cleared by the [place] FDA and  has been authorized for detection and/or diagnosis of SARS-CoV-2 by FDA under an Emergency Use Authorization (EUA). This EUA will remain  in effect (meaning this test can be used) for the duration of the COVID-19 declaration under Section 564(b)(1) of the Act, 21 U.S.C.section 360bbb-3(b)(1), unless the authorization is terminated  or revoked sooner.       Influenza Jontay Maston by PCR NEGATIVE NEGATIVE Final   Influenza B by PCR NEGATIVE NEGATIVE Final    Comment: (NOTE) The Xpert Xpress SARS-CoV-2/FLU/RSV plus assay is intended as an aid in the diagnosis of influenza from Nasopharyngeal swab specimens and should not be used as Gaither Biehn sole basis for treatment. Nasal washings and aspirates are unacceptable for Xpert Xpress SARS-CoV-2/FLU/RSV testing.  Fact Sheet for Patients: BloggerCourse.com  Fact Sheet for Healthcare Providers: SeriousBroker.it  This test is not yet approved or cleared by the [place] FDA and has been authorized for detection and/or diagnosis of SARS-CoV-2 by FDA under an Emergency Use Authorization (EUA). This EUA will remain in effect (meaning this test can be used) for the duration of the COVID-19 declaration under Section 564(b)(1) of the Act, 21 U.S.C. section 360bbb-3(b)(1), unless the authorization is terminated or revoked.     Resp Syncytial Virus by PCR NEGATIVE NEGATIVE Final    Comment: (NOTE) Fact Sheet for Patients: BloggerCourse.com  Fact Sheet for Healthcare Providers: SeriousBroker.it  This test is not yet approved or cleared by the [place] FDA and has been authorized for detection and/or diagnosis of SARS-CoV-2 by FDA under an Emergency Use  Authorization (EUA). This EUA will remain in effect (meaning this test can be used) for the duration of the COVID-19 declaration under Section 564(b)(1) of the Act, 21 U.S.C. section 360bbb-3(b)(1), unless the authorization is terminated or revoked.  Performed at [facility], [address] [place], [place] [address]   Blood Culture (routine x 2)     Status: Abnormal   Collection Time: 06/26/23  9:00 PM   Specimen: BLOOD  Result Value Ref Range Status   Specimen Description   Final    BLOOD BLOOD RIGHT HAND Performed at [facility], [address] [place],  [place] [address]    Special Requests   Final    BOTTLES DRAWN AEROBIC AND ANAEROBIC Blood Culture results may not be optimal due to an inadequate volume of blood received in culture bottles Performed at [facility], [address], [place], [place] [address]    Culture  Setup Time   Final    GRAM NEGATIVE RODS ANAEROBIC BOTTLE ONLY CRITICAL RESULT CALLED TO, READ BACK BY AND VERIFIED WITH: Roni Bread [id number] @ 1549 FH Performed at [facility], [address] [place], [place] [address]    Culture KLEBSIELLA OXYTOCA (Analina Filla)  Final   Report Status 06/29/2023 FINAL  Final   Organism ID, Bacteria KLEBSIELLA OXYTOCA  Final      Susceptibility   Klebsiella oxytoca - MIC*    AMPICILLIN RESISTANT Resistant     CEFEPIME <=0.12 SENSITIVE Sensitive     CEFTAZIDIME <=1 SENSITIVE Sensitive     CEFTRIAXONE <=0.25 SENSITIVE Sensitive     CIPROFLOXACIN <=0.25 SENSITIVE Sensitive     GENTAMICIN <=1 SENSITIVE Sensitive     IMIPENEM 0.5 SENSITIVE Sensitive     TRIMETH/SULFA <=20 SENSITIVE Sensitive     AMPICILLIN/SULBACTAM 4 SENSITIVE Sensitive     PIP/TAZO <=4 SENSITIVE Sensitive ug/mL    * KLEBSIELLA OXYTOCA  Blood Culture (routine x 2)     Status: None (Preliminary result)   Collection Time: 06/26/23  9:00 PM   Specimen: BLOOD LEFT HAND  Result Value Ref Range  Status   Specimen Description   Final    BLOOD LEFT HAND Performed at [facility], [address], [place], [place] [address]    Special Requests   Final    BOTTLES DRAWN AEROBIC AND ANAEROBIC Blood Culture results may not be optimal due to an inadequate volume of blood received in culture bottles Performed at [facility], [address] [place], [place] [address]    Culture   Final    NO GROWTH 3 DAYS Performed at [facility], [address], [place], [place] [address]    Report Status PENDING  Incomplete  Blood Culture ID Panel (Reflexed)     Status: Abnormal   Collection Time: 06/26/23  9:00 PM  Result Value Ref Range Status   Enterococcus faecalis NOT DETECTED NOT DETECTED Final   Enterococcus Faecium NOT DETECTED NOT DETECTED Final   Listeria monocytogenes NOT DETECTED NOT DETECTED Final   Staphylococcus species NOT DETECTED NOT DETECTED Final   Staphylococcus aureus (BCID) NOT DETECTED NOT DETECTED Final   Staphylococcus epidermidis NOT DETECTED NOT DETECTED Final   Staphylococcus lugdunensis NOT DETECTED NOT DETECTED Final   Streptococcus species NOT DETECTED NOT DETECTED Final   Streptococcus agalactiae NOT DETECTED NOT DETECTED Final   Streptococcus pneumoniae NOT DETECTED NOT DETECTED Final   Streptococcus pyogenes NOT DETECTED NOT DETECTED Final   Amarra Sawyer.calcoaceticus-baumannii NOT DETECTED NOT DETECTED Final   Bacteroides fragilis NOT DETECTED NOT DETECTED Final   Enterobacterales DETECTED (Zachry Hopfensperger) NOT DETECTED Final    Comment: Enterobacterales represent Amier Hoyt large order of gram negative bacteria, not Musa Rewerts single organism. CRITICAL RESULT CALLED TO, READ BACK BY AND VERIFIED WITH: PHARMD D. WOFFORD [id number] @ 1549 FH    Enterobacter cloacae complex NOT DETECTED NOT DETECTED Final   Escherichia coli NOT DETECTED NOT DETECTED Final   Klebsiella aerogenes NOT DETECTED NOT DETECTED Final   Klebsiella oxytoca DETECTED (Beckie Viscardi) NOT DETECTED Final    Comment:  CRITICAL RESULT CALLED TO, READ BACK BY AND  VERIFIED WITH: PHARMD D. [id number] [id number] @ 1549 FH    Klebsiella pneumoniae NOT DETECTED NOT DETECTED Final   Proteus species NOT DETECTED NOT DETECTED Final   Salmonella species NOT DETECTED NOT DETECTED Final   Serratia marcescens NOT DETECTED NOT DETECTED Final   Haemophilus influenzae NOT DETECTED NOT DETECTED Final   Neisseria meningitidis NOT DETECTED NOT DETECTED Final   Pseudomonas aeruginosa NOT DETECTED NOT DETECTED Final   Stenotrophomonas maltophilia NOT DETECTED NOT DETECTED Final   Candida albicans NOT DETECTED NOT DETECTED Final   Candida auris NOT DETECTED NOT DETECTED Final   Candida glabrata NOT DETECTED NOT DETECTED Final   Candida krusei NOT DETECTED NOT DETECTED Final   Candida parapsilosis NOT DETECTED NOT DETECTED Final   Candida tropicalis NOT DETECTED NOT DETECTED Final   Cryptococcus neoformans/gattii NOT DETECTED NOT DETECTED Final   CTX-M ESBL NOT DETECTED NOT DETECTED Final   Carbapenem resistance IMP NOT DETECTED NOT DETECTED Final   Carbapenem resistance KPC NOT DETECTED NOT DETECTED Final   Carbapenem resistance NDM NOT DETECTED NOT DETECTED Final   Carbapenem resist OXA 48 LIKE NOT DETECTED NOT DETECTED Final   Carbapenem resistance VIM NOT DETECTED NOT DETECTED Final    Comment: Performed at [facility], [address], [place], [place] [address]  Urine Culture     Status: Abnormal   Collection Time: 06/27/23 12:24 AM   Specimen: Urine, Random  Result Value Ref Range Status   Specimen Description   Final    URINE, RANDOM Performed at [facility], [address] [place], [place] [address]    Special Requests   Final    NONE Reflexed from [id number] Performed at [facility], [address], [place], [place] [address]    Culture MULTIPLE SPECIES PRESENT, SUGGEST RECOLLECTION (Johana Hopkinson)  Final   Report Status 06/28/2023 FINAL  Final  Respiratory (~20 pathogens) panel by  PCR     Status: Abnormal   Collection Time: 06/27/23  6:28 AM   Specimen: Nasopharyngeal Swab; Respiratory  Result Value Ref Range Status   Adenovirus NOT DETECTED NOT DETECTED Final   Coronavirus 229E NOT DETECTED NOT DETECTED Final    Comment: (NOTE) The Coronavirus on the Respiratory Panel, DOES NOT test for the novel  Coronavirus (2019 nCoV)    Coronavirus HKU1 NOT DETECTED NOT DETECTED Final   Coronavirus NL63 NOT DETECTED NOT DETECTED Final   Coronavirus OC43 NOT DETECTED NOT DETECTED Final   Metapneumovirus NOT DETECTED NOT DETECTED Final   Rhinovirus / Enterovirus DETECTED (Hessie Varone) NOT DETECTED Final   Influenza Tinna Kolker NOT DETECTED NOT DETECTED Final   Influenza B NOT DETECTED NOT DETECTED Final   Parainfluenza Virus 1 NOT DETECTED NOT DETECTED Final   Parainfluenza Virus 2 NOT DETECTED NOT DETECTED Final   Parainfluenza Virus 3 NOT DETECTED NOT DETECTED Final   Parainfluenza Virus 4 NOT DETECTED NOT DETECTED Final   Respiratory Syncytial Virus NOT DETECTED NOT DETECTED Final   Bordetella pertussis NOT DETECTED NOT DETECTED Final   Bordetella Parapertussis NOT DETECTED NOT DETECTED Final   Chlamydophila pneumoniae NOT DETECTED NOT DETECTED Final   Mycoplasma pneumoniae NOT DETECTED NOT DETECTED Final    Comment: Performed at [facility], [address] [place], [place] [address]         Radiology Studies: No results found.       Scheduled Meds:  sodium chloride   Intravenous Once   amLODipine  5 mg Oral Daily  carvedilol  3.125 mg Oral BID WC   Chlorhexidine Gluconate Cloth  6 each Topical Daily   pantoprazole  40 mg Oral Daily   potassium chloride SA  20 mEq Oral Daily   rosuvastatin  20 mg Oral QHS   sodium chloride flush  10-40 mL Intracatheter Q12H   sodium chloride flush  3 mL Intravenous Q12H   venlafaxine XR  150 mg Oral Q breakfast   Continuous Infusions:  cefTRIAXone (ROCEPHIN)  IV 2 g (06/28/23 1730)     LOS: 2 days    Time spent: over 30  min    Lacretia Nicks, MD [facility]   To contact the attending provider between 7A-7P or the covering provider during after hours 7P-7A, please log into the web site [email or web address] and access using universal [place] password for that web site. If you do not have the password, please call the hospital operator.  06/29/2023, 3:06 PM

## 2023-06-29 NOTE — Progress Notes (Signed)
   06/29/23 2300  BiPAP/CPAP/SIPAP  BiPAP/CPAP/SIPAP Pt Type Adult (Pt placed self on cpap machine)  BiPAP/CPAP/SIPAP DREAMSTATIOND  Mask Type Full face mask  Mask Size Medium  Patient Home Equipment No  Auto Titrate Yes (5-20)

## 2023-06-29 NOTE — Consult Note (Signed)
 [facility] Liaison Consult Note   06/29/2023  Luis [name] Jun 29, 1955 [id number]  Insurance: [employer]  Primary Care Provider: Nelwyn Salisbury, MD with [facility] [place], this provider is listed for the transition of care follow up appointments  and [facility] calls   [facility] Liaison screened the patient remotely at [facility].    The patient was screened for hospitalization with noted extreme risk score for unplanned readmission risk 3 hospital admissions in 6 months to check for post hospital care coordination needs.  The patient was assessed for potential [facility] Coordination service needs for post hospital transition for care coordination. Review of patient's electronic medical record reveals patient is followed by Oncology team, urology team, HF team for chronic diseases.  Plan: [facility] Liaison will continue to follow progress and disposition to asess for post hospital community care coordination/management needs.  Referral request for community care coordination: With provider office patient is anticipated for post hospital TOC calls. SDOH intact. Will follow for any new needs at disposition.   [facility], Population Health does not replace or interfere with any arrangements made by the Inpatient Transition of Care team.   For questions contact:   Charlesetta Shanks, RN, BSN, CCM [place]  [facility] Liaison Direct Dial: [phone] or secure chat Email: [email or web address]

## 2023-06-29 NOTE — Consult Note (Addendum)
 Consultation: Bilateral hydronephrosis, rising creatinine Requested by: Dr. Lacretia Nicks  History of Present Illness: Luis [name] is a 68 year old male who underwent radical cystoprostatectomy November 2024.  He had node positive disease and is seeing Dr. [name].  He had 1 gem/cis cycle but that was held due to rising creatinine.  He also saw Dr. Kathrynn Running and began bladder fossa and pelvic radiation.  He saw Dr. [name] December 2024 and had his last ureteral stent removed in the office.  He had a urinary tract infection and was admitted in December.  He was admitted 06/26/2023 with malaise and fever.  Blood cultures positive for Klebsiella. He underwent a 06/27/2023 CT scan of the abdomen and pelvis which revealed bilateral hydroureteronephrosis down to the ileal conduit with mild distention of the conduit to the fascia.  No lymphadenopathy or bone lesions.  Since his surgery and with stent removal his creatinine has increased.  His baseline was 0.9-1.3 range.  His creatinine went up to 2.4 in December 2024 but then back down to 0.95.  In February creatinine increased to 2.4 and then declined to 2.05.  In March his creatinine has gone from 2.7 --> 2.3 --> 2.7 --> 3.01.    Today, seen for the above.  He still feels weak but better.  He does report the bag is filling with urine and he is draining it. Past Medical History:  Diagnosis Date   Anxiety    takes Valium as needed   Aortic stenosis, moderate    Arthritis    Ascending aortic aneurysm (HCC)    CAD (coronary artery disease)    a. s/p PCI of the RCA 8/12 with DES by Dr Excell Seltzer, preserved EF. b. LHC/RHC (2/16) with mean RA 12, PA 32/15, mean PCWP 18, CI 3.47; patent mid and distal RCA stents, 50-60% proximal stenosis small PDA.      Cancer [facility])    bladder   Carotid stenosis    a. Carotid US (05/2013):  Bilateral 1-39% ICA; L thyroid nodule (prior hx of aspiration).   Chronic diastolic CHF (congestive heart failure) (HCC)    Complication of anesthesia     difficulty waking up after gallbladder surgery   Depression    Dyspnea    Essential hypertension    GERD (gastroesophageal reflux disease)    if needed will take OTC meds    Heart murmur    History of colonic polyps    hyperplastic   Hyperlipidemia    Joint pain    Lesion of bladder    Myocardial infarction (HCC) 2012   Obesity (BMI 30-39.9) 02/29/2016   Pre-diabetes    Restless leg    Sleep apnea    uses cpap   Tubular adenoma of colon    Vertigo    takes Meclizine as needed   Past Surgical History:  Procedure Laterality Date   AORTIC VALVE REPLACEMENT N/A 09/16/2021   Procedure: AORTIC VALVE REPLACEMENT (AVR);  Surgeon: Alleen Borne, MD;  Location: [facility] OR;  Service: Open Heart Surgery;  Laterality: N/A;   CATARACT EXTRACTION   4 YRS AGO   BOTH EYES   CHOLECYSTECTOMY  07/21/2011   Procedure: LAPAROSCOPIC CHOLECYSTECTOMY WITH INTRAOPERATIVE CHOLANGIOGRAM;  Surgeon: Kandis Cocking, MD;  [facility];  Service: General;  Laterality: N/A;   CORONARY ANGIOPLASTY  2012   2 stents   coronary stenting     s/p PCI of the RCA by Dr Excell Seltzer 8/12 with 2 promus stents   CYSTOSCOPY W/ RETROGRADES Bilateral 12/13/2019  Procedure: CYSTOSCOPY WITH RETROGRADE PYELOGRAM;  Surgeon: Sebastian Ache, MD;  Location[facility];  Service: Urology;  Laterality: Bilateral;   CYSTOSCOPY W/ RETROGRADES Bilateral 10/14/2020   Procedure: CYSTOSCOPY WITH RETROGRADE PYELOGRAM;  Surgeon: Sebastian Ache, MD;  Location: [facility];  Service: Urology;  Laterality: Bilateral;   CYSTOSCOPY W/ RETROGRADES Bilateral 12/16/2020   Procedure: CYSTOSCOPY WITH RETROGRADE PYELOGRAM;  Surgeon: Sebastian Ache, MD;  Location: [facility];  Service: Urology;  Laterality: Bilateral;   CYSTOSCOPY W/ RETROGRADES Bilateral 06/03/2022   Procedure: CYSTOSCOPY WITH RETROGRADE PYELOGRAM;  Surgeon: Sebastian Ache, MD;  [facility];  Service: Urology;  Laterality: Bilateral;    CYSTOSCOPY W/ RETROGRADES Bilateral 12/28/2022   Procedure: CYSTOSCOPY WITH RETROGRADE PYELOGRAM, FULGARATION OF BLEEDERS;  Surgeon: Loletta Parish., MD;  [facility];  Service: Urology;  Laterality: Bilateral;   CYSTOSCOPY WITH INJECTION N/A 03/03/2023   Procedure: CYSTOSCOPY WITH INDOCYANINE INJECTION;  Surgeon: Loletta Parish., MD;  [facility];  Service: Urology;  Laterality: N/A;  360 MINUTES   IR IMAGING GUIDED PORT INSERTION  05/29/2023   LEFT AND RIGHT HEART CATHETERIZATION WITH CORONARY ANGIOGRAM N/A 06/23/2014   Procedure: LEFT AND RIGHT HEART CATHETERIZATION WITH CORONARY ANGIOGRAM;  Surgeon: Laurey Morale, MD;  Location: [facility] CATH LAB;  Service: Cardiovascular;  Laterality: N/A;   NECK SURGERY  03/23/09   per Dr. Ophelia Charter, cervical fusion    PERICARDIOCENTESIS N/A 09/28/2021   Procedure: PERICARDIOCENTESIS;  Surgeon: Corky Crafts, MD;  Location: [facility] INVASIVE CV LAB;  Service: Cardiovascular;  Laterality: N/A;   REPLACEMENT ASCENDING AORTA N/A 09/16/2021   Procedure: REPLACEMENT ASCENDING AORTA WITH 30 X HEMASHIELD PLATINUM WOVEN DOUBLE VELOUR VASCULAR GRAFT;  Surgeon: Alleen Borne, MD;  [facility];  Service: Open Heart Surgery;  Laterality: N/A;  CIRC ARREST   right elbow surgery     RIGHT HEART CATH N/A 06/15/2023   Procedure: RIGHT HEART CATH;  Surgeon: Laurey Morale, MD;  Location: [facility] INVASIVE CV LAB;  Service: Cardiovascular;  Laterality: N/A;   RIGHT HEART CATH AND CORONARY ANGIOGRAPHY N/A 07/08/2021   Procedure: RIGHT HEART CATH AND CORONARY ANGIOGRAPHY;  Surgeon: Laurey Morale, MD;  Location: [facility] INVASIVE CV LAB;  Service: Cardiovascular;  Laterality: N/A;   ROBOT ASSISTED LAPAROSCOPIC COMPLETE CYSTECT ILEAL CONDUIT N/A 03/03/2023   Procedure: XI ROBOTIC ASSISTED LAPAROSCOPIC COMPLETE CYSTECTECTOMY WITH  ILEAL CONDUIT DIVERSION;  Surgeon: Loletta Parish., MD;  [facility];  Service: Urology;  Laterality: N/A;   ROBOT ASSISTED  LAPAROSCOPIC RADICAL PROSTATECTOMY N/A 03/03/2023   Procedure: XI ROBOTIC ASSISTED LAPAROSCOPIC RADICAL PROSTATECTOMY WITH LYMPH NODE DISSECTION;  Surgeon: Loletta Parish., MD;  [facility];  Service: Urology;  Laterality: N/A;   solonscopy  05/23/08   per Dr. Celene Kras hemorrhoids only, repeat in 5 years   SUBXYPHOID PERICARDIAL WINDOW N/A 09/28/2021   Procedure: SUBXYPHOID PERICARDIAL WINDOW;  Surgeon: Alleen Borne, MD;  [facility];  Service: Thoracic;  Laterality: N/A;   TEE WITHOUT CARDIOVERSION N/A 06/23/2014   Procedure: TRANSESOPHAGEAL ECHOCARDIOGRAM (TEE);  Surgeon: Laurey Morale, MD;  Location: [facility] ENDOSCOPY;  Service: Cardiovascular;  Laterality: N/A;   TEE WITHOUT CARDIOVERSION N/A 01/21/2016   Procedure: TRANSESOPHAGEAL ECHOCARDIOGRAM (TEE);  Surgeon: Laurey Morale, MD;  Location: [facility] ENDOSCOPY;  Service: Cardiovascular;  Laterality: N/A;   TEE WITHOUT CARDIOVERSION N/A 09/16/2021   Procedure: TRANSESOPHAGEAL ECHOCARDIOGRAM (TEE);  Surgeon: Alleen Borne, MD;  [facility];  Service: Open Heart Surgery;  Laterality: N/A;   TEE WITHOUT CARDIOVERSION N/A 09/28/2021   Procedure: TRANSESOPHAGEAL ECHOCARDIOGRAM (TEE);  Surgeon: Alleen Borne, MD;  Location: [facility] OR;  Service: Thoracic;  Laterality: N/A;   TONSILLECTOMY     TRANSESOPHAGEAL ECHOCARDIOGRAM (CATH LAB) N/A 04/17/2023   Procedure: TRANSESOPHAGEAL ECHOCARDIOGRAM;  Surgeon: Jake Bathe, MD;  [facility] INVASIVE CV LAB;  Service: Cardiovascular;  Laterality: N/A;   TRANSURETHRAL RESECTION OF BLADDER TUMOR N/A 10/21/2019   Procedure: TRANSURETHRAL RESECTION OF BLADDER TUMOR (TURBT);  Surgeon: Ihor Gully, MD;  Location: [facility];  Service: Urology;  Laterality: N/A;   TRANSURETHRAL RESECTION OF BLADDER TUMOR N/A 12/13/2019   Procedure: TRANSURETHRAL RESECTION OF BLADDER TUMOR (TURBT);  Surgeon: Sebastian Ache, MD;  Location: [facility];  Service: Urology;  Laterality:  N/A;  1 HR   TRANSURETHRAL RESECTION OF BLADDER TUMOR N/A 10/14/2020   Procedure: TRANSURETHRAL RESECTION OF BLADDER TUMOR (TURBT);  Surgeon: Sebastian Ache, MD;  Location: [facility];  Service: Urology;  Laterality: N/A;   TRANSURETHRAL RESECTION OF BLADDER TUMOR N/A 12/16/2020   Procedure: RESTAGING TRANSURETHRAL RESECTION OF BLADDER TUMOR (TURBT);  Surgeon: Sebastian Ache, MD;  Location: [facility];  Service: Urology;  Laterality: N/A;   TRANSURETHRAL RESECTION OF BLADDER TUMOR N/A 06/03/2022   Procedure: TRANSURETHRAL RESECTION OF BLADDER TUMOR (TURBT);  Surgeon: Sebastian Ache, MD;  [facility];  Service: Urology;  Laterality: N/A;   UMBILICAL HERNIA REPAIR  03/03/2023   Procedure: HERNIA REPAIR UMBILICAL;  Surgeon: Loletta Parish., MD;  [facility];  Service: Urology;;    Home Medications:  Medications Prior to Admission  Medication Sig Dispense Refill Last Dose/Taking   acetaminophen (TYLENOL) 325 MG tablet Take 650 mg by mouth every 6 (six) hours as needed (for pain).   06/26/2023   amLODipine (NORVASC) 5 MG tablet Take 1 tablet (5 mg total) by mouth daily. 90 tablet 1 Past Week   aspirin EC 81 MG tablet Take 81 mg by mouth in the morning.   06/26/2023 Morning   carvedilol (COREG) 3.125 MG tablet TAKE 1 TABLET BY MOUTH TWICE A DAY WITH FOOD 180 tablet 1 06/26/2023   cyanocobalamin (VITAMIN B12) 1000 MCG tablet Take 1 tablet (1,000 mcg total) by mouth daily. 30 tablet 6 06/26/2023   diazepam (VALIUM) 5 MG tablet TAKE 1 TABLET BY MOUTH EVERY 12 HOURS AS NEEDED FOR ANXIETY. 60 tablet 5 Taking As Needed   furosemide (LASIX) 20 MG tablet Take 1 tablet (20 mg total) by mouth daily. 45 tablet 3 06/26/2023   ipratropium (ATROVENT) 0.03 % nasal spray Place 2 sprays into both nostrils every 12 (twelve) hours as needed for rhinitis.   Taking As Needed   lidocaine-prilocaine (EMLA) cream Apply 1 Application topically as needed. 30 g 0 Taking As Needed    Magnesium 500 MG TABS Take 500 mg by mouth in the morning.   06/26/2023   meclizine (ANTIVERT) 25 MG tablet Take 1 tablet (25 mg total) by mouth 3 (three) times daily as needed for dizziness. 30 tablet 0 Taking As Needed   multivitamin-iron-minerals-folic acid (CENTRUM) chewable tablet Chew 1 tablet by mouth daily.   06/26/2023   nitroGLYCERIN (NITROSTAT) 0.4 MG SL tablet Place 0.4 mg under the tongue every 5 (five) minutes as needed for chest pain.   Taking As Needed   ondansetron (ZOFRAN) 8 MG tablet Take 1 tablet (8 mg total) by mouth every 8 (eight) hours as needed.  30 tablet 0 Taking As Needed   potassium chloride SA (KLOR-CON M20) 20 MEQ tablet Take 1 tablet (20 mEq total) by mouth daily.   06/26/2023 Morning   prochlorperazine (COMPAZINE) 10 MG tablet Take 1 tablet (10 mg total) by mouth every 6 (six) hours as needed for nausea or vomiting. 30 tablet 0 Taking As Needed   rosuvastatin (CRESTOR) 20 MG tablet TAKE 1 TABLET BY MOUTH EVERYDAY AT BEDTIME 90 tablet 3 06/26/2023   venlafaxine XR (EFFEXOR-XR) 150 MG 24 hr capsule TAKE 1 CAPSULE BY MOUTH DAILY WITH BREAKFAST. 90 capsule 0 06/26/2023 Morning   JARDIANCE 10 MG TABS tablet Take 1 tablet (10 mg total) by mouth daily. (Patient not taking: Reported on 06/20/2023) 30 tablet 11    Allergies: No Known Allergies  Family History  Problem Relation Age of Onset   Lung cancer Mother        lung   Esophageal cancer Cousin    Colon cancer Neg Hx    Rectal cancer Neg Hx    Stomach cancer Neg Hx    Social History:  reports that he quit smoking about 13 years ago. His smoking use included cigarettes. He started smoking about 53 years ago. He has a 80 pack-year smoking history. He has never been exposed to tobacco smoke. He has never used smokeless tobacco. He reports that he does not drink alcohol and does not use drugs.  ROS: A complete review of systems was performed.  All systems are negative except for pertinent findings as noted. Review of Systems   All other systems reviewed and are negative.    Physical Exam:  Vital signs in last 24 hours: Temp:  [97.6 F (36.4 C)-98.6 F (37 C)] 98.6 F (37 C) (03/06 1157) Pulse Rate:  [52-61] 53 (03/06 1157) Resp:  [16-24] 20 (03/06 0528) BP: (99-135)/(49-70) 118/70 (03/06 1157) SpO2:  [97 %-100 %] 100 % (03/06 1157) General:  Alert and oriented, No acute distress HEENT: Normocephalic, atraumatic Cardiovascular: Regular rate and rhythm Lungs: Regular rate and effort Abdomen: Soft, nontender, nondistended, no abdominal masses, ileostomy pink and viable.  I lubricated a Silastic Foley and gently tried to advance it but it was a bit too stiff and wouldn't turn or advance.  Therefore I took a 25 [place] coud Foley catheter and this angled medially and passed through the fascia.  3 cc were put in the balloon and it was pulled back and seated at the abdominal wall.  Urine drained around the catheter and through it. Urine clear.  Back: No CVA tenderness Extremities: No edema Neurologic: Grossly intact  Laboratory Data:  Results for orders placed or performed during the hospital encounter of 06/26/23 (from the past 24 hours)  Hemoglobin and hematocrit, blood     Status: Abnormal   Collection Time: 06/28/23  7:40 PM  Result Value Ref Range   Hemoglobin 7.6 (L) 13.0 - 17.0 g/dL   HCT 09.8 (L) 11.9 - 14.7 %  CBC with Differential/Platelet     Status: Abnormal   Collection Time: 06/29/23  4:23 AM  Result Value Ref Range   WBC 5.6 4.0 - 10.5 K/uL   RBC 2.70 (L) 4.22 - 5.81 MIL/uL   Hemoglobin 7.6 (L) 13.0 - 17.0 g/dL   HCT 82.9 (L) 56.2 - 13.0 %   MCV 89.6 80.0 - 100.0 fL   MCH 28.1 26.0 - 34.0 pg   MCHC 31.4 30.0 - 36.0 g/dL   RDW 86.5 (H) 78.4 - 69.6 %  Platelets 159 150 - 400 K/uL   nRBC 0.0 0.0 - 0.2 %   Neutrophils Relative % 73 %   Neutro Abs 4.1 1.7 - 7.7 K/uL   Lymphocytes Relative 15 %   Lymphs Abs 0.8 0.7 - 4.0 K/uL   Monocytes Relative 9 %   Monocytes Absolute 0.5 0.1 - 1.0  K/uL   Eosinophils Relative 2 %   Eosinophils Absolute 0.1 0.0 - 0.5 K/uL   Basophils Relative 0 %   Basophils Absolute 0.0 0.0 - 0.1 K/uL   Immature Granulocytes 1 %   Abs Immature Granulocytes 0.03 0.00 - 0.07 K/uL  Comprehensive metabolic panel     Status: Abnormal   Collection Time: 06/29/23  4:23 AM  Result Value Ref Range   Sodium 136 135 - 145 mmol/L   Potassium 3.9 3.5 - 5.1 mmol/L   Chloride 106 98 - 111 mmol/L   CO2 19 (L) 22 - 32 mmol/L   Glucose, Bld 117 (H) 70 - 99 mg/dL   BUN 39 (H) 8 - 23 mg/dL   Creatinine, Ser 9.52 (H) 0.61 - 1.24 mg/dL   Calcium 8.2 (L) 8.9 - 10.3 mg/dL   Total Protein 7.3 6.5 - 8.1 g/dL   Albumin 2.4 (L) 3.5 - 5.0 g/dL   AST 57 (H) 15 - 41 U/L   ALT 59 (H) 0 - 44 U/L   Alkaline Phosphatase 159 (H) 38 - 126 U/L   Total Bilirubin 0.5 0.0 - 1.2 mg/dL   GFR, Estimated 22 (L) >60 mL/min   Anion gap 11 5 - 15  Magnesium     Status: None   Collection Time: 06/29/23  4:23 AM  Result Value Ref Range   Magnesium 2.2 1.7 - 2.4 mg/dL  Phosphorus     Status: Abnormal   Collection Time: 06/29/23  4:23 AM  Result Value Ref Range   Phosphorus 4.8 (H) 2.5 - 4.6 mg/dL   Recent Results (from the past 240 hours)  Resp panel by RT-PCR (RSV, Flu A&B, Covid)     Status: None   Collection Time: 06/26/23  9:00 PM   Specimen: Nasal Swab  Result Value Ref Range Status   SARS Coronavirus 2 by RT PCR NEGATIVE NEGATIVE Final    Comment: (NOTE) SARS-CoV-2 target nucleic acids are NOT DETECTED.  The SARS-CoV-2 RNA is generally detectable in upper respiratory specimens during the acute phase of infection. The lowest concentration of SARS-CoV-2 viral copies this assay can detect is 138 copies/mL. A negative result does not preclude SARS-Cov-2 infection and should not be used as the sole basis for treatment or other patient management decisions. A negative result may occur with  improper specimen collection/handling, submission of specimen other than nasopharyngeal  swab, presence of viral mutation(s) within the areas targeted by this assay, and inadequate number of viral copies(<138 copies/mL). A negative result must be combined with clinical observations, patient history, and epidemiological information. The expected result is Negative.  Fact Sheet for Patients:  BloggerCourse.com  Fact Sheet for Healthcare Providers:  SeriousBroker.it  This test is no t yet approved or cleared by the [place] FDA and  has been authorized for detection and/or diagnosis of SARS-CoV-2 by FDA under an Emergency Use Authorization (EUA). This EUA will remain  in effect (meaning this test can be used) for the duration of the COVID-19 declaration under Section 564(b)(1) of the Act, 21 U.S.C.section 360bbb-3(b)(1), unless the authorization is terminated  or revoked sooner.  Influenza A by PCR NEGATIVE NEGATIVE Final   Influenza B by PCR NEGATIVE NEGATIVE Final    Comment: (NOTE) The Xpert Xpress SARS-CoV-2/FLU/RSV plus assay is intended as an aid in the diagnosis of influenza from Nasopharyngeal swab specimens and should not be used as a sole basis for treatment. Nasal washings and aspirates are unacceptable for Xpert Xpress SARS-CoV-2/FLU/RSV testing.  Fact Sheet for Patients: BloggerCourse.com  Fact Sheet for Healthcare Providers: SeriousBroker.it  This test is not yet approved or cleared by the [place] FDA and has been authorized for detection and/or diagnosis of SARS-CoV-2 by FDA under an Emergency Use Authorization (EUA). This EUA will remain in effect (meaning this test can be used) for the duration of the COVID-19 declaration under Section 564(b)(1) of the Act, 21 U.S.C. section 360bbb-3(b)(1), unless the authorization is terminated or revoked.     Resp Syncytial Virus by PCR NEGATIVE NEGATIVE Final    Comment: (NOTE) Fact Sheet for  Patients: BloggerCourse.com  Fact Sheet for Healthcare Providers: SeriousBroker.it  This test is not yet approved or cleared by the [place] FDA and has been authorized for detection and/or diagnosis of SARS-CoV-2 by FDA under an Emergency Use Authorization (EUA). This EUA will remain in effect (meaning this test can be used) for the duration of the COVID-19 declaration under Section 564(b)(1) of the Act, 21 U.S.C. section 360bbb-3(b)(1), unless the authorization is terminated or revoked.  Performed at [facility], [address] [place], [place] [address]   Blood Culture (routine x 2)     Status: Abnormal   Collection Time: 06/26/23  9:00 PM   Specimen: BLOOD  Result Value Ref Range Status   Specimen Description   Final    BLOOD BLOOD RIGHT HAND Performed at [facility], [address], [place], [place] [address]    Special Requests   Final    BOTTLES DRAWN AEROBIC AND ANAEROBIC Blood Culture results may not be optimal due to an inadequate volume of blood received in culture bottles Performed at [facility], [address] [place], [place] [address]    Culture  Setup Time   Final    GRAM NEGATIVE RODS ANAEROBIC BOTTLE ONLY CRITICAL RESULT CALLED TO, READ BACK BY AND VERIFIED WITH: Roni Bread [id number] @ 1549 FH Performed at [facility], [address], [place], [place] [address]    Culture KLEBSIELLA OXYTOCA (A)  Final   Report Status 06/29/2023 FINAL  Final   Organism ID, Bacteria KLEBSIELLA OXYTOCA  Final      Susceptibility   Klebsiella oxytoca - MIC*    AMPICILLIN RESISTANT Resistant     CEFEPIME <=0.12 SENSITIVE Sensitive     CEFTAZIDIME <=1 SENSITIVE Sensitive     CEFTRIAXONE <=0.25 SENSITIVE Sensitive     CIPROFLOXACIN <=0.25 SENSITIVE Sensitive     GENTAMICIN <=1 SENSITIVE Sensitive     IMIPENEM 0.5 SENSITIVE Sensitive     TRIMETH/SULFA  <=20 SENSITIVE Sensitive     AMPICILLIN/SULBACTAM 4 SENSITIVE Sensitive     PIP/TAZO <=4 SENSITIVE Sensitive ug/mL    * KLEBSIELLA OXYTOCA  Blood Culture (routine x 2)     Status: None (Preliminary result)   Collection Time: 06/26/23  9:00 PM   Specimen: BLOOD LEFT HAND  Result Value Ref Range Status   Specimen Description   Final    BLOOD LEFT HAND Performed at [facility], [address], [place], [place] [address]    Special Requests  Final    BOTTLES DRAWN AEROBIC AND ANAEROBIC Blood Culture results may not be optimal due to an inadequate volume of blood received in culture bottles Performed at [facility], [address], [place], [place] [address]    Culture   Final    NO GROWTH 3 DAYS Performed at [facility], [address] [place], [place] [address]    Report Status PENDING  Incomplete  Blood Culture ID Panel (Reflexed)     Status: Abnormal   Collection Time: 06/26/23  9:00 PM  Result Value Ref Range Status   Enterococcus faecalis NOT DETECTED NOT DETECTED Final   Enterococcus Faecium NOT DETECTED NOT DETECTED Final   Listeria monocytogenes NOT DETECTED NOT DETECTED Final   Staphylococcus species NOT DETECTED NOT DETECTED Final   Staphylococcus aureus (BCID) NOT DETECTED NOT DETECTED Final   Staphylococcus epidermidis NOT DETECTED NOT DETECTED Final   Staphylococcus lugdunensis NOT DETECTED NOT DETECTED Final   Streptococcus species NOT DETECTED NOT DETECTED Final   Streptococcus agalactiae NOT DETECTED NOT DETECTED Final   Streptococcus pneumoniae NOT DETECTED NOT DETECTED Final   Streptococcus pyogenes NOT DETECTED NOT DETECTED Final   A.calcoaceticus-baumannii NOT DETECTED NOT DETECTED Final   Bacteroides fragilis NOT DETECTED NOT DETECTED Final   Enterobacterales DETECTED (A) NOT DETECTED Final    Comment: Enterobacterales represent a large order of gram negative bacteria, not a single organism. CRITICAL RESULT CALLED TO, READ  BACK BY AND VERIFIED WITH: PHARMD D. WOFFORD [id number] @ 1549 FH    Enterobacter cloacae complex NOT DETECTED NOT DETECTED Final   Escherichia coli NOT DETECTED NOT DETECTED Final   Klebsiella aerogenes NOT DETECTED NOT DETECTED Final   Klebsiella oxytoca DETECTED (A) NOT DETECTED Final    Comment: CRITICAL RESULT CALLED TO, READ BACK BY AND VERIFIED WITH: PHARMD D. [id number] @ 1549 FH    Klebsiella pneumoniae NOT DETECTED NOT DETECTED Final   Proteus species NOT DETECTED NOT DETECTED Final   Salmonella species NOT DETECTED NOT DETECTED Final   Serratia marcescens NOT DETECTED NOT DETECTED Final   Haemophilus influenzae NOT DETECTED NOT DETECTED Final   Neisseria meningitidis NOT DETECTED NOT DETECTED Final   Pseudomonas aeruginosa NOT DETECTED NOT DETECTED Final   Stenotrophomonas maltophilia NOT DETECTED NOT DETECTED Final   Candida albicans NOT DETECTED NOT DETECTED Final   Candida auris NOT DETECTED NOT DETECTED Final   Candida glabrata NOT DETECTED NOT DETECTED Final   Candida krusei NOT DETECTED NOT DETECTED Final   Candida parapsilosis NOT DETECTED NOT DETECTED Final   Candida tropicalis NOT DETECTED NOT DETECTED Final   Cryptococcus neoformans/gattii NOT DETECTED NOT DETECTED Final   CTX-M ESBL NOT DETECTED NOT DETECTED Final   Carbapenem resistance IMP NOT DETECTED NOT DETECTED Final   Carbapenem resistance KPC NOT DETECTED NOT DETECTED Final   Carbapenem resistance NDM NOT DETECTED NOT DETECTED Final   Carbapenem resist OXA 48 LIKE NOT DETECTED NOT DETECTED Final   Carbapenem resistance VIM NOT DETECTED NOT DETECTED Final    Comment: Performed at [facility], [address], [place], [place] [address]  Urine Culture     Status: Abnormal   Collection Time: 06/27/23 12:24 AM   Specimen: Urine, Random  Result Value Ref Range Status   Specimen Description   Final    URINE, RANDOM Performed at [facility], [address], [place], [place]  [address]    Special Requests   Final    NONE Reflexed  from [id number] Performed at [facility], [address] [place], [place] [address]    Culture MULTIPLE SPECIES PRESENT, SUGGEST RECOLLECTION (A)  Final   Report Status 06/28/2023 FINAL  Final  Respiratory (~20 pathogens) panel by PCR     Status: Abnormal   Collection Time: 06/27/23  6:28 AM   Specimen: Nasopharyngeal Swab; Respiratory  Result Value Ref Range Status   Adenovirus NOT DETECTED NOT DETECTED Final   Coronavirus 229E NOT DETECTED NOT DETECTED Final    Comment: (NOTE) The Coronavirus on the Respiratory Panel, DOES NOT test for the novel  Coronavirus (2019 nCoV)    Coronavirus HKU1 NOT DETECTED NOT DETECTED Final   Coronavirus NL63 NOT DETECTED NOT DETECTED Final   Coronavirus OC43 NOT DETECTED NOT DETECTED Final   Metapneumovirus NOT DETECTED NOT DETECTED Final   Rhinovirus / Enterovirus DETECTED (A) NOT DETECTED Final   Influenza A NOT DETECTED NOT DETECTED Final   Influenza B NOT DETECTED NOT DETECTED Final   Parainfluenza Virus 1 NOT DETECTED NOT DETECTED Final   Parainfluenza Virus 2 NOT DETECTED NOT DETECTED Final   Parainfluenza Virus 3 NOT DETECTED NOT DETECTED Final   Parainfluenza Virus 4 NOT DETECTED NOT DETECTED Final   Respiratory Syncytial Virus NOT DETECTED NOT DETECTED Final   Bordetella pertussis NOT DETECTED NOT DETECTED Final   Bordetella Parapertussis NOT DETECTED NOT DETECTED Final   Chlamydophila pneumoniae NOT DETECTED NOT DETECTED Final   Mycoplasma pneumoniae NOT DETECTED NOT DETECTED Final    Comment: Performed at [facility], 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  The ureters appear widely patent to the conduit and the conduit is full of urine.  Possible obstruction at the fascia.  Metastatic disease being treated with pelvic radiation with chemotherapy on hold due to rising creatinine.  Plan:  A 16 French Foley catheter was placed to allow urine to drain through it and around it.  Will follow urine output and more importantly creatinine levels.  He may require repeat imaging, possible loopogram or nephrostomy tubes with antegrade nephrostograms.  Urology will follow.  Discussed with Dr. [name] and he is aware.  Jerilee Field 06/29/2023, 1:23 PM

## 2023-06-29 NOTE — Consult Note (Addendum)
 [facility] Nurse ostomy consult note Called by the bedside nurse for leaking urostomy pouch.  Refer to previous urology notes; a red rubber tube was inserted to the urostomy stoma at that time.  Current pouch is leaking and the red rubber tube has fallen out in the pouch with the balloon still inflated.  Applied barrier ring and new pouch. Pt states he uses a flat one piece pouch prior to admission. Stoma is red and viable, flush with skin level, 1 inch. Extra supplies ordered to the room for staff nurses use.  Use Supplies: barrier [place] [phone] and wafer #[id number] and Gigi Gin [phone]. Call placed to Urology office to leave a message to them of the events.  Please re-consult if further assistance is needed.  Thank-you,  Cammie Mcgee MSN, RN, CWOCN, [place], CNS [phone]

## 2023-06-29 NOTE — Plan of Care (Signed)

## 2023-06-29 NOTE — Progress Notes (Signed)
 The foley I placed in the conduit fell out. I replaced a 16 Fr coude in the conduit past the fascia and put about 8 cc in the balloon. Catheter mobile back to the abd wall and draining.

## 2023-06-29 NOTE — Anesthesia Postprocedure Evaluation (Signed)
 Anesthesia Post Note  Patient: Luis [name]  Procedure(s) Performed: EGD (ESOPHAGOGASTRODUODENOSCOPY)     Patient location during evaluation: Endoscopy Anesthesia Type: MAC Level of consciousness: awake and alert Pain management: pain level controlled Vital Signs Assessment: post-procedure vital signs reviewed and stable Respiratory status: spontaneous breathing Cardiovascular status: stable Anesthetic complications: no   No notable events documented.  Last Vitals:  Vitals:   06/29/23 0528 06/29/23 1157  BP: 115/67 118/70  Pulse: (!) 55 (!) 53  Resp: 20   Temp: 36.9 C 37 C  SpO2: 99% 100%    Last Pain:  Vitals:   06/29/23 1157  TempSrc: Oral  PainSc:                  [name]

## 2023-06-29 NOTE — Progress Notes (Signed)
   06/29/23 1349  TOC Brief Assessment  Insurance and Status Reviewed  Patient has primary care physician Yes  Home environment has been reviewed Home w/ spouse  Prior level of function: Independent  Prior/Current Home Services No current home services  Social Drivers of Health Review SDOH reviewed no interventions necessary  Readmission risk has been reviewed Yes  Transition of care needs no transition of care needs at this time

## 2023-06-30 ENCOUNTER — Other Ambulatory Visit: Payer: Self-pay

## 2023-06-30 ENCOUNTER — Ambulatory Visit
Admission: RE | Admit: 2023-06-30 | Discharge: 2023-06-30 | Disposition: A | Discharge status: Home / Self Care | Payer: PPO | Source: Ambulatory Visit | Attending: Radiation Oncology | Admitting: Radiation Oncology

## 2023-06-30 DIAGNOSIS — R6510 Systemic inflammatory response syndrome (SIRS) of non-infectious origin without acute organ dysfunction: Secondary | ICD-10-CM | POA: Diagnosis not present

## 2023-06-30 DIAGNOSIS — R7881 Bacteremia: Secondary | ICD-10-CM | POA: Diagnosis not present

## 2023-06-30 DIAGNOSIS — C678 Malignant neoplasm of overlapping sites of bladder: Secondary | ICD-10-CM | POA: Diagnosis not present

## 2023-06-30 DIAGNOSIS — B961 Klebsiella pneumoniae [K. pneumoniae] as the cause of diseases classified elsewhere: Secondary | ICD-10-CM | POA: Diagnosis not present

## 2023-06-30 DIAGNOSIS — B348 Other viral infections of unspecified site: Secondary | ICD-10-CM | POA: Diagnosis not present

## 2023-06-30 DIAGNOSIS — Z510 Encounter for antineoplastic radiation therapy: Secondary | ICD-10-CM | POA: Diagnosis not present

## 2023-06-30 LAB — CBC WITH DIFFERENTIAL/PLATELET
Abs Immature Granulocytes: 0.03 10*3/uL (ref 0.00–0.07)
Basophils Absolute: 0 10*3/uL (ref 0.0–0.1)
Basophils Relative: 0 %
Eosinophils Absolute: 0.1 10*3/uL (ref 0.0–0.5)
Eosinophils Relative: 3 %
HCT: 24.3 % — ABNORMAL LOW (ref 39.0–52.0)
Hemoglobin: 7.6 g/dL — ABNORMAL LOW (ref 13.0–17.0)
Immature Granulocytes: 1 %
Lymphocytes Relative: 17 %
Lymphs Abs: 0.9 10*3/uL (ref 0.7–4.0)
MCH: 27.9 pg (ref 26.0–34.0)
MCHC: 31.3 g/dL (ref 30.0–36.0)
MCV: 89.3 fL (ref 80.0–100.0)
Monocytes Absolute: 0.5 10*3/uL (ref 0.1–1.0)
Monocytes Relative: 10 %
Neutro Abs: 3.7 10*3/uL (ref 1.7–7.7)
Neutrophils Relative %: 69 %
Platelets: 141 10*3/uL — ABNORMAL LOW (ref 150–400)
RBC: 2.72 MIL/uL — ABNORMAL LOW (ref 4.22–5.81)
RDW: 15.9 % — ABNORMAL HIGH (ref 11.5–15.5)
WBC: 5.3 10*3/uL (ref 4.0–10.5)
nRBC: 0 % (ref 0.0–0.2)

## 2023-06-30 LAB — COMPREHENSIVE METABOLIC PANEL
ALT: 59 U/L — ABNORMAL HIGH (ref 0–44)
AST: 48 U/L — ABNORMAL HIGH (ref 15–41)
Albumin: 2.3 g/dL — ABNORMAL LOW (ref 3.5–5.0)
Alkaline Phosphatase: 153 U/L — ABNORMAL HIGH (ref 38–126)
Anion gap: 11 (ref 5–15)
BUN: 41 mg/dL — ABNORMAL HIGH (ref 8–23)
CO2: 21 mmol/L — ABNORMAL LOW (ref 22–32)
Calcium: 7.8 mg/dL — ABNORMAL LOW (ref 8.9–10.3)
Chloride: 105 mmol/L (ref 98–111)
Creatinine, Ser: 3.25 mg/dL — ABNORMAL HIGH (ref 0.61–1.24)
GFR, Estimated: 20 mL/min — ABNORMAL LOW (ref >60)
Glucose, Bld: 108 mg/dL — ABNORMAL HIGH (ref 70–99)
Potassium: 4 mmol/L (ref 3.5–5.1)
Sodium: 137 mmol/L (ref 135–145)
Total Bilirubin: 0.9 mg/dL (ref 0.0–1.2)
Total Protein: 7.2 g/dL (ref 6.5–8.1)

## 2023-06-30 LAB — RAD ONC ARIA SESSION SUMMARY
Course Elapsed Days: 11
Plan Fractions Treated to Date: 9
Plan Prescribed Dose Per Fraction: 1.8 Gy
Plan Total Fractions Prescribed: 28
Plan Total Prescribed Dose: 50.4 Gy
Reference Point Dosage Given to Date: 16.2 Gy
Reference Point Session Dosage Given: 1.8 Gy
Session Number: 9

## 2023-06-30 LAB — PHOSPHORUS: Phosphorus: 5.1 mg/dL — ABNORMAL HIGH (ref 2.5–4.6)

## 2023-06-30 LAB — MAGNESIUM: Magnesium: 2.2 mg/dL (ref 1.7–2.4)

## 2023-06-30 NOTE — Consult Note (Addendum)
 [facility]                                                                                        Patient Identification: Patient Name: Luis [name] MRN: [id number] Admit Date: 06/26/2023  8:34 PM Today's Date: 06/30/2023 Reason for consult: Bacteremia  Requesting provider: Dr Lowell Guitar  Principal Problem:   SIRS (systemic inflammatory response syndrome) (HCC) Active Problems:   CAD (coronary artery disease)   Hyperlipidemia   OSA (obstructive sleep apnea)   Chronic diastolic CHF (congestive heart failure) (HCC)   Essential hypertension   Bladder cancer (HCC)   History of aortic valve replacement with tissue graft   Acute on chronic anemia   Renal dysfunction  Antibiotics:  Augmentin 3/6- Ceftriaxone 3/4-3/5 Unasyn 3/3, 3/4  Lines/Hardware: Rt chest port 2/3 Left ureteral stent  Rt ureteral stent RUQ ileal conduit  Assessment # Klebsiella oxytoca bacteremia: Possible GU source. No signs of infection in port. Uncommon to have endocarditis with GNR organisms.   # h/o prior E faecalis bacteremia in December 2024 s/p 4 weeks of IV ampicillin, EOT 05/12/2023 with negative surviellance blood cultures   # Rhino virus +   # AKI on CKD-evaluated by urology, Foley placed to allow urine to drain, may need repeat imaging, monitor C  # Mild transaminitis - stable   # Thrombocytopenia   # Anemia -evaluated by GI, s/p EGD with nonbleeding gastric ulcers without stigmata of bleeding, biopsied( path unremarkable for malaignancy or H pylori)  # Bladder cancer status cystoprostatectomy with ileal conduit -Follows Dr. [name], started on radiation therapy approx a  week prior to admission  Recommendations  - continue PO augmentin, complete 2 weeks course for possible GU source, EOT 07/09/23 - Patient has a fu appt at [facility] on 4/8 at 11: 30 am, plan for surveillance blood cultures - Droplet  precautions - ID will SO at this time, please recall back with questions or concerns.   Rest of the management as per the primary team. Please call with questions or concerns.  Thank you for the consult  __________________________________________________________________________________________________________ HPI and Hospital Course: 68 year old male with prior history of aortic stenosis, ascending aortic aneurysm s/p bioprosthetic AVR with aortic graft replacement, Pericardial effusion s/p windown, bladder CA s/p * cycles of Pembrolizumab, radical cystoprostatectomy with ileal conduit in 02/2023 with node positive disease s/p 1 cycle of gem/cis held due to elevated Cr and receiving radiation prior to admission,  CAD s/p PCI, carotid stenosis, CHF, HTN, HLD, prediabetes, obesity, depression, E faecalis bacteremia  in 03/2023 s/p treatment who presented to the ED on 3/3 with fevers for 2 days with associated loose stools, malodorous urine.  He was feeling generalized weakness for the prior 1-2 week with intermittent shaking chill.  Did not have abdominal pain or chest pain or shortness of breath but had some nasal congestion and nonproductive cough on arrival.   At ED, febrile otherwise vitals unremarkable Labs with NA 133, k 5.9, BG 172, Cr 2.7, creatinine 2.26 WBC 12.3 hemoglobin 5.8 ( prior 8.2) Lactic acid 2.1 SARS-CoV-2/influenza A and B/RSV PCR negative.  RVP positive for rhinovirus/enterovirus.  FOBT negative UA with large leukocytes and many bacteria, urine cx with multiple spp Chest x-ray unremarkable CT abdomen pelvis with mild bilateral hydroureteronephrosis without obstructing calculus Started on IV antibiotics, IV Protonix ID consulted for abtx recs due to GNR bacteremia  ROS: General- Denies fever, chills, loss of appetite and loss of weight HEENT - Denies headache, blurry vision, neck pain, sinus pain Chest - Denies any chest pain, SOB or cough CVS- Denies any  dizziness/lightheadedness, syncopal attacks, palpitations Abdomen- Denies any nausea, vomiting, abdominal pain, hematochezia and diarrhea Neuro - Denies any weakness, numbness, tingling sensation Psych - Denies any changes in mood irritability or depressive symptoms GU- Denies any burning, dysuria, hematuria or increased frequency of urination Skin - denies any rashes/lesions MSK - denies any joint pain/swelling or restricted ROM   Past Medical History:  Diagnosis Date   Anxiety    takes Valium as needed   Aortic stenosis, moderate    Arthritis    Ascending aortic aneurysm (HCC)    CAD (coronary artery disease)    a. s/p PCI of the RCA 8/12 with DES by Dr Excell Seltzer, preserved EF. b. LHC/RHC (2/16) with mean RA 12, PA 32/15, mean PCWP 18, CI 3.47; patent mid and distal RCA stents, 50-60% proximal stenosis small PDA.      Cancer [facility])    bladder   Carotid stenosis    a. Carotid US (05/2013):  Bilateral 1-39% ICA; L thyroid nodule (prior hx of aspiration).   Chronic diastolic CHF (congestive heart failure) (HCC)    Complication of anesthesia    difficulty waking up after gallbladder surgery   Depression    Dyspnea    Essential hypertension    GERD (gastroesophageal reflux disease)    if needed will take OTC meds    Heart murmur    History of colonic polyps    hyperplastic   Hyperlipidemia    Joint pain    Lesion of bladder    Myocardial infarction (HCC) 2012   Obesity (BMI 30-39.9) 02/29/2016   Pre-diabetes    Restless leg    Sleep apnea    uses cpap   Tubular adenoma of colon    Vertigo    takes Meclizine as needed   Past Surgical History:  Procedure Laterality Date   AORTIC VALVE REPLACEMENT N/A 09/16/2021   Procedure: AORTIC VALVE REPLACEMENT (AVR);  Surgeon: Alleen Borne, MD;  Location: [facility] OR;  Service: Open Heart Surgery;  Laterality: N/A;   CATARACT EXTRACTION   4 YRS AGO   BOTH EYES   CHOLECYSTECTOMY  07/21/2011   Procedure: LAPAROSCOPIC CHOLECYSTECTOMY WITH  INTRAOPERATIVE CHOLANGIOGRAM;  Surgeon: Kandis Cocking, MD;  Location: [facility]S;  Service: General;  Laterality: N/A;   CORONARY ANGIOPLASTY  2012   2 stents   coronary stenting     s/p PCI of the RCA by Dr Excell Seltzer 8/12 with 2 promus stents   CYSTOSCOPY W/ RETROGRADES Bilateral 12/13/2019   Procedure: CYSTOSCOPY WITH RETROGRADE PYELOGRAM;  Surgeon: Sebastian Ache, MD;  Location: [facility];  Service: Urology;  Laterality: Bilateral;   CYSTOSCOPY W/ RETROGRADES Bilateral 10/14/2020   Procedure: CYSTOSCOPY WITH RETROGRADE PYELOGRAM;  Surgeon: Sebastian Ache, MD;  Location: [facility];  Service: Urology;  Laterality: Bilateral;   CYSTOSCOPY W/ RETROGRADES Bilateral 12/16/2020   Procedure: CYSTOSCOPY WITH RETROGRADE PYELOGRAM;  Surgeon: Sebastian Ache, MD;  Location: [facility];  Service: Urology;  Laterality: Bilateral;   CYSTOSCOPY W/ RETROGRADES Bilateral 06/03/2022   Procedure:  CYSTOSCOPY WITH RETROGRADE PYELOGRAM;  Surgeon: Sebastian Ache, MD;  Location: [facility];  Service: Urology;  Laterality: Bilateral;   CYSTOSCOPY W/ RETROGRADES Bilateral 12/28/2022   Procedure: CYSTOSCOPY WITH RETROGRADE PYELOGRAM, FULGARATION OF BLEEDERS;  Surgeon: Loletta Parish., MD;  Location: [facility];  Service: Urology;  Laterality: Bilateral;   CYSTOSCOPY WITH INJECTION N/A 03/03/2023   Procedure: CYSTOSCOPY WITH INDOCYANINE INJECTION;  Surgeon: Loletta Parish., MD;  Location: [facility];  Service: Urology;  Laterality: N/A;  360 MINUTES   IR IMAGING GUIDED PORT INSERTION  05/29/2023   LEFT AND RIGHT HEART CATHETERIZATION WITH CORONARY ANGIOGRAM N/A 06/23/2014   Procedure: LEFT AND RIGHT HEART CATHETERIZATION WITH CORONARY ANGIOGRAM;  Surgeon: Laurey Morale, MD;  Location: [facility] CATH LAB;  Service: Cardiovascular;  Laterality: N/A;   NECK SURGERY  03/23/09   per Dr. Ophelia Charter, cervical fusion    PERICARDIOCENTESIS N/A 09/28/2021   Procedure: PERICARDIOCENTESIS;  Surgeon:  Corky Crafts, MD;  Location: [facility] INVASIVE CV LAB;  Service: Cardiovascular;  Laterality: N/A;   REPLACEMENT ASCENDING AORTA N/A 09/16/2021   Procedure: REPLACEMENT ASCENDING AORTA WITH 30 X HEMASHIELD PLATINUM WOVEN DOUBLE VELOUR VASCULAR GRAFT;  Surgeon: Alleen Borne, MD;  Location: [facility] OR;  Service: Open Heart Surgery;  Laterality: N/A;  CIRC ARREST   right elbow surgery     RIGHT HEART CATH N/A 06/15/2023   Procedure: RIGHT HEART CATH;  Surgeon: Laurey Morale, MD;  Location: [facility] INVASIVE CV LAB;  Service: Cardiovascular;  Laterality: N/A;   RIGHT HEART CATH AND CORONARY ANGIOGRAPHY N/A 07/08/2021   Procedure: RIGHT HEART CATH AND CORONARY ANGIOGRAPHY;  Surgeon: Laurey Morale, MD;  Location: [facility] INVASIVE CV LAB;  Service: Cardiovascular;  Laterality: N/A;   ROBOT ASSISTED LAPAROSCOPIC COMPLETE CYSTECT ILEAL CONDUIT N/A 03/03/2023   Procedure: XI ROBOTIC ASSISTED LAPAROSCOPIC COMPLETE CYSTECTECTOMY WITH  ILEAL CONDUIT DIVERSION;  Surgeon: Loletta Parish., MD;  Location[facility];  Service: Urology;  Laterality: N/A;   ROBOT ASSISTED LAPAROSCOPIC RADICAL PROSTATECTOMY N/A 03/03/2023   Procedure: XI ROBOTIC ASSISTED LAPAROSCOPIC RADICAL PROSTATECTOMY WITH LYMPH NODE DISSECTION;  Surgeon: Loletta Parish., MD;  Location: [facility];  Service: Urology;  Laterality: N/A;   solonscopy  05/23/08   per Dr. Celene Kras hemorrhoids only, repeat in 5 years   SUBXYPHOID PERICARDIAL WINDOW N/A 09/28/2021   Procedure: SUBXYPHOID PERICARDIAL WINDOW;  Surgeon: Alleen Borne, MD;  Location: [facility] OR;  Service: Thoracic;  Laterality: N/A;   TEE WITHOUT CARDIOVERSION N/A 06/23/2014   Procedure: TRANSESOPHAGEAL ECHOCARDIOGRAM (TEE);  Surgeon: Laurey Morale, MD;  Location: [facility] ENDOSCOPY;  Service: Cardiovascular;  Laterality: N/A;   TEE WITHOUT CARDIOVERSION N/A 01/21/2016   Procedure: TRANSESOPHAGEAL ECHOCARDIOGRAM (TEE);  Surgeon: Laurey Morale, MD;  Location: [facility] ENDOSCOPY;  Service:  Cardiovascular;  Laterality: N/A;   TEE WITHOUT CARDIOVERSION N/A 09/16/2021   Procedure: TRANSESOPHAGEAL ECHOCARDIOGRAM (TEE);  Surgeon: Alleen Borne, MD;  Location: [facility] OR;  Service: Open Heart Surgery;  Laterality: N/A;   TEE WITHOUT CARDIOVERSION N/A 09/28/2021   Procedure: TRANSESOPHAGEAL ECHOCARDIOGRAM (TEE);  Surgeon: Alleen Borne, MD;  Location: [facility] OR;  Service: Thoracic;  Laterality: N/A;   TONSILLECTOMY     TRANSESOPHAGEAL ECHOCARDIOGRAM (CATH LAB) N/A 04/17/2023   Procedure: TRANSESOPHAGEAL ECHOCARDIOGRAM;  Surgeon: Jake Bathe, MD;  Location: [facility] INVASIVE CV LAB;  Service: Cardiovascular;  Laterality: N/A;   TRANSURETHRAL RESECTION OF BLADDER TUMOR N/A 10/21/2019   Procedure: TRANSURETHRAL RESECTION OF BLADDER TUMOR (TURBT);  Surgeon: Vernie Ammons,  Loraine Leriche, MD;  Location: [facility];  Service: Urology;  Laterality: N/A;   TRANSURETHRAL RESECTION OF BLADDER TUMOR N/A 12/13/2019   Procedure: TRANSURETHRAL RESECTION OF BLADDER TUMOR (TURBT);  Surgeon: Sebastian Ache, MD;  Location: [facility];  Service: Urology;  Laterality: N/A;  1 HR   TRANSURETHRAL RESECTION OF BLADDER TUMOR N/A 10/14/2020   Procedure: TRANSURETHRAL RESECTION OF BLADDER TUMOR (TURBT);  Surgeon: Sebastian Ache, MD;  Location: [facility];  Service: Urology;  Laterality: N/A;   TRANSURETHRAL RESECTION OF BLADDER TUMOR N/A 12/16/2020   Procedure: RESTAGING TRANSURETHRAL RESECTION OF BLADDER TUMOR (TURBT);  Surgeon: Sebastian Ache, MD;  Location: [facility];  Service: Urology;  Laterality: N/A;   TRANSURETHRAL RESECTION OF BLADDER TUMOR N/A 06/03/2022   Procedure: TRANSURETHRAL RESECTION OF BLADDER TUMOR (TURBT);  Surgeon: Sebastian Ache, MD;  Location: [facility];  Service: Urology;  Laterality: N/A;   UMBILICAL HERNIA REPAIR  03/03/2023   Procedure: HERNIA REPAIR UMBILICAL;  Surgeon: Loletta Parish., MD;  Location: [facility];  Service: Urology;;    Scheduled  Meds:  sodium chloride   Intravenous Once   amLODipine  5 mg Oral Daily   amoxicillin-clavulanate  1 tablet Oral BID   carvedilol  3.125 mg Oral BID WC   Chlorhexidine Gluconate Cloth  6 each Topical Daily   pantoprazole  40 mg Oral Daily   potassium chloride SA  20 mEq Oral Daily   rosuvastatin  20 mg Oral QHS   sodium chloride flush  10-40 mL Intracatheter Q12H   sodium chloride flush  3 mL Intravenous Q12H   venlafaxine XR  150 mg Oral Q breakfast   Continuous Infusions:  lactated ringers 100 mL/hr at 06/30/23 0000   PRN Meds:.acetaminophen **OR** acetaminophen, diazepam, ondansetron **OR** ondansetron (ZOFRAN) IV, senna-docusate, sodium chloride flush  No Known Allergies  Social History   Socioeconomic History   Marital status: Married    Spouse name: Not on file   Number of children: 4   Years of education: Not on file   Highest education level: Not on file  Occupational History   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 [employer]  Tobacco Use   Smoking status: Former    Current packs/day: 0.00    Average packs/day: 2.0 packs/day for 40.0 years (80.0 ttl pk-yrs)    Types: Cigarettes    Start date: 62    Quit date: 2012    Years since quitting: 13.1    Passive exposure: Never   Smokeless tobacco: Never  Vaping Use   Vaping status: Never Used  Substance and Sexual Activity   Alcohol use: No    Alcohol/week: 0.0 standard drinks of alcohol   Drug use: No   Sexual activity: Not on file  Other Topics Concern   Not on file  Social History Narrative   Not on file   Social Drivers of Health   Financial Resource Strain: Low Risk  (11/03/2022)   Overall Financial Resource Strain (CARDIA)    Difficulty of Paying Living Expenses: Not hard at all  Food Insecurity: No Food Insecurity (06/27/2023)   Hunger Vital Sign    Worried About Running Out of Food in the Last Year: Never true    Ran Out of Food in the Last Year: Never true  Transportation  Needs: No Transportation Needs (06/27/2023)   PRAPARE - Administrator, Civil Service (Medical): No    Lack of Transportation (Non-Medical): No  Physical Activity: Unknown (11/03/2022)   Exercise Vital Sign    Days of Exercise per Week: Patient unable to answer    Minutes of Exercise per Session: 30 min  Stress: No Stress Concern Present (11/03/2022)   [employer] of Occupational Health - Occupational Stress Questionnaire    Feeling of Stress : Not at all  Social Connections: Moderately Isolated (06/27/2023)   Social Connection and Isolation Panel [NHANES]    Frequency of Communication with Friends and Family: More than three times a week    Frequency of Social Gatherings with Friends and Family: More than three times a week    Attends Religious Services: Never    Database administrator or Organizations: No    Attends Banker Meetings: Never    Marital Status: Married  Catering manager Violence: Not At Risk (06/27/2023)   Humiliation, Afraid, Rape, and Kick questionnaire    Fear of Current or Ex-Partner: No    Emotionally Abused: No    Physically Abused: No    Sexually Abused: No   Family History  Problem Relation Age of Onset   Lung cancer Mother        lung   Esophageal cancer Cousin    Colon cancer Neg Hx    Rectal cancer Neg Hx    Stomach cancer Neg Hx    Vitals BP 135/79 (BP Location: Right Arm)   Pulse (!) 57   Temp 98.1 F (36.7 C)   Resp 18   Ht 5\' 6"  (1.676 m)   Wt 112 kg   SpO2 100%   BMI 39.85 kg/m     Physical Exam Constitutional: Elderly male sitting at edge of the bed, not in acute distress. Wife at bedside    Comments: He is finishing up his breakfast, HEENT WNL  Cardiovascular:     Rate and Rhythm: Normal rate and regular rhythm.     Heart sounds: S1 and S2  Pulmonary:     Effort: Pulmonary effort is normal.     Comments:   Abdominal:     Palpations: Abdomen is soft.     Tenderness: Nondistended and  nontender  Musculoskeletal:        General: No swelling or tenderness in peripheral joints  Skin:    Comments: No rashes, no signs of infection in the right sided chest port  Neurological:     General: Awake, alert and oriented, grossly nonfocal  Psychiatric:        Mood and Affect: Mood normal.   Pathology  EGD 06/28/23 FINAL MICROSCOPIC DIAGNOSIS:  A. STOMACH, ULCER AND ANTRUM, BIOPSY:  - Gastric antral mucosa with nonspecific reactive gastropathy  - Negative for intestinal metaplasia, dysplasia or malignancy  - Helicobacter pylori-like organisms are not identified on routine HE  stain   Pertinent Microbiology Results for orders placed or performed during the hospital encounter of 06/26/23  Resp panel by RT-PCR (RSV, Flu A&B, Covid)     Status: None   Collection Time: 06/26/23  9:00 PM   Specimen: Nasal Swab  Result Value Ref Range Status   SARS Coronavirus 2 by RT PCR NEGATIVE NEGATIVE Final    Comment: (NOTE) SARS-CoV-2 target nucleic acids are NOT DETECTED.  The SARS-CoV-2 RNA is generally detectable in upper respiratory specimens during the acute phase of infection. The lowest concentration of SARS-CoV-2 viral copies this assay can detect is 138 copies/mL. A negative result does not preclude SARS-Cov-2 infection and should not be used as the sole basis  for treatment or other patient management decisions. A negative result may occur with  improper specimen collection/handling, submission of specimen other than nasopharyngeal swab, presence of viral mutation(s) within the areas targeted by this assay, and inadequate number of viral copies(<138 copies/mL). A negative result must be combined with clinical observations, patient history, and epidemiological information. The expected result is Negative.  Fact Sheet for Patients:  BloggerCourse.com  Fact Sheet for Healthcare Providers:  SeriousBroker.it  This test is no t  yet approved or cleared by the [place] FDA and  has been authorized for detection and/or diagnosis of SARS-CoV-2 by FDA under an Emergency Use Authorization (EUA). This EUA will remain  in effect (meaning this test can be used) for the duration of the COVID-19 declaration under Section 564(b)(1) of the Act, 21 U.S.C.section 360bbb-3(b)(1), unless the authorization is terminated  or revoked sooner.       Influenza A by PCR NEGATIVE NEGATIVE Final   Influenza B by PCR NEGATIVE NEGATIVE Final    Comment: (NOTE) The Xpert Xpress SARS-CoV-2/FLU/RSV plus assay is intended as an aid in the diagnosis of influenza from Nasopharyngeal swab specimens and should not be used as a sole basis for treatment. Nasal washings and aspirates are unacceptable for Xpert Xpress SARS-CoV-2/FLU/RSV testing.  Fact Sheet for Patients: BloggerCourse.com  Fact Sheet for Healthcare Providers: SeriousBroker.it  This test is not yet approved or cleared by the [place] FDA and has been authorized for detection and/or diagnosis of SARS-CoV-2 by FDA under an Emergency Use Authorization (EUA). This EUA will remain in effect (meaning this test can be used) for the duration of the COVID-19 declaration under Section 564(b)(1) of the Act, 21 U.S.C. section 360bbb-3(b)(1), unless the authorization is terminated or revoked.     Resp Syncytial Virus by PCR NEGATIVE NEGATIVE Final    Comment: (NOTE) Fact Sheet for Patients: BloggerCourse.com  Fact Sheet for Healthcare Providers: SeriousBroker.it  This test is not yet approved or cleared by the [place] FDA and has been authorized for detection and/or diagnosis of SARS-CoV-2 by FDA under an Emergency Use Authorization (EUA). This EUA will remain in effect (meaning this test can be used) for the duration of the COVID-19 declaration under Section 564(b)(1)  of the Act, 21 U.S.C. section 360bbb-3(b)(1), unless the authorization is terminated or revoked.  Performed at [facility], [address], [place], [place] [address]   Blood Culture (routine x 2)     Status: Abnormal   Collection Time: 06/26/23  9:00 PM   Specimen: BLOOD  Result Value Ref Range Status   Specimen Description   Final    BLOOD BLOOD RIGHT HAND Performed at [facility], [address], [place], [place] [address]    Special Requests   Final    BOTTLES DRAWN AEROBIC AND ANAEROBIC Blood Culture results may not be optimal due to an inadequate volume of blood received in culture bottles Performed at [facility], [address], [place], [place] [address]    Culture  Setup Time   Final    GRAM NEGATIVE RODS ANAEROBIC BOTTLE ONLY CRITICAL RESULT CALLED TO, READ BACK BY AND VERIFIED WITH: Roni Bread BJYNWGN [id number] @ 1549 FH Performed at [facility], [address] [place], [place] [address]    Culture KLEBSIELLA OXYTOCA (A)  Final   Report Status 06/29/2023 FINAL  Final   Organism ID, Bacteria KLEBSIELLA OXYTOCA  Final      Susceptibility   Klebsiella  oxytoca - MIC*    AMPICILLIN RESISTANT Resistant     CEFEPIME <=0.12 SENSITIVE Sensitive     CEFTAZIDIME <=1 SENSITIVE Sensitive     CEFTRIAXONE <=0.25 SENSITIVE Sensitive     CIPROFLOXACIN <=0.25 SENSITIVE Sensitive     GENTAMICIN <=1 SENSITIVE Sensitive     IMIPENEM 0.5 SENSITIVE Sensitive     TRIMETH/SULFA <=20 SENSITIVE Sensitive     AMPICILLIN/SULBACTAM 4 SENSITIVE Sensitive     PIP/TAZO <=4 SENSITIVE Sensitive ug/mL    * KLEBSIELLA OXYTOCA  Blood Culture (routine x 2)     Status: None (Preliminary result)   Collection Time: 06/26/23  9:00 PM   Specimen: BLOOD LEFT HAND  Result Value Ref Range Status   Specimen Description   Final    BLOOD LEFT HAND Performed at [facility], [address] [place], [place] [address]    Special Requests    Final    BOTTLES DRAWN AEROBIC AND ANAEROBIC Blood Culture results may not be optimal due to an inadequate volume of blood received in culture bottles Performed at [facility], [address] [place], [place] [address]    Culture   Final    NO GROWTH 3 DAYS Performed at [facility], [address], [place], [place] [address]    Report Status PENDING  Incomplete  Blood Culture ID Panel (Reflexed)     Status: Abnormal   Collection Time: 06/26/23  9:00 PM  Result Value Ref Range Status   Enterococcus faecalis NOT DETECTED NOT DETECTED Final   Enterococcus Faecium NOT DETECTED NOT DETECTED Final   Listeria monocytogenes NOT DETECTED NOT DETECTED Final   Staphylococcus species NOT DETECTED NOT DETECTED Final   Staphylococcus aureus (BCID) NOT DETECTED NOT DETECTED Final   Staphylococcus epidermidis NOT DETECTED NOT DETECTED Final   Staphylococcus lugdunensis NOT DETECTED NOT DETECTED Final   Streptococcus species NOT DETECTED NOT DETECTED Final   Streptococcus agalactiae NOT DETECTED NOT DETECTED Final   Streptococcus pneumoniae NOT DETECTED NOT DETECTED Final   Streptococcus pyogenes NOT DETECTED NOT DETECTED Final   A.calcoaceticus-baumannii NOT DETECTED NOT DETECTED Final   Bacteroides fragilis NOT DETECTED NOT DETECTED Final   Enterobacterales DETECTED (A) NOT DETECTED Final    Comment: Enterobacterales represent a large order of gram negative bacteria, not a single organism. CRITICAL RESULT CALLED TO, READ BACK BY AND VERIFIED WITH: PHARMD D. WOFFORD [id number] @ 1549 FH    Enterobacter cloacae complex NOT DETECTED NOT DETECTED Final   Escherichia coli NOT DETECTED NOT DETECTED Final   Klebsiella aerogenes NOT DETECTED NOT DETECTED Final   Klebsiella oxytoca DETECTED (A) NOT DETECTED Final    Comment: CRITICAL RESULT CALLED TO, READ BACK BY AND VERIFIED WITH: PHARMD D. BJYNWGN [id number] @ 1549 FH    Klebsiella pneumoniae NOT DETECTED NOT DETECTED Final    Proteus species NOT DETECTED NOT DETECTED Final   Salmonella species NOT DETECTED NOT DETECTED Final   Serratia marcescens NOT DETECTED NOT DETECTED Final   Haemophilus influenzae NOT DETECTED NOT DETECTED Final   Neisseria meningitidis NOT DETECTED NOT DETECTED Final   Pseudomonas aeruginosa NOT DETECTED NOT DETECTED Final   Stenotrophomonas maltophilia NOT DETECTED NOT DETECTED Final   Candida albicans NOT DETECTED NOT DETECTED Final   Candida auris NOT DETECTED NOT DETECTED Final   Candida glabrata NOT DETECTED NOT DETECTED Final   Candida krusei NOT DETECTED NOT DETECTED Final   Candida parapsilosis NOT DETECTED NOT DETECTED Final   Candida tropicalis NOT DETECTED  NOT DETECTED Final   Cryptococcus neoformans/gattii NOT DETECTED NOT DETECTED Final   CTX-M ESBL NOT DETECTED NOT DETECTED Final   Carbapenem resistance IMP NOT DETECTED NOT DETECTED Final   Carbapenem resistance KPC NOT DETECTED NOT DETECTED Final   Carbapenem resistance NDM NOT DETECTED NOT DETECTED Final   Carbapenem resist OXA 48 LIKE NOT DETECTED NOT DETECTED Final   Carbapenem resistance VIM NOT DETECTED NOT DETECTED Final    Comment: Performed at [facility], [address], [place], [place] [address]  Urine Culture     Status: Abnormal   Collection Time: 06/27/23 12:24 AM   Specimen: Urine, Random  Result Value Ref Range Status   Specimen Description   Final    URINE, RANDOM Performed at [facility], [address] [place], [place] [address]    Special Requests   Final    NONE Reflexed from [id number] Performed at [facility], [address], [place], [place] [address]    Culture MULTIPLE SPECIES PRESENT, SUGGEST RECOLLECTION (A)  Final   Report Status 06/28/2023 FINAL  Final  Respiratory (~20 pathogens) panel by PCR     Status: Abnormal   Collection Time: 06/27/23  6:28 AM   Specimen: Nasopharyngeal Swab; Respiratory  Result Value Ref Range Status   Adenovirus  NOT DETECTED NOT DETECTED Final   Coronavirus 229E NOT DETECTED NOT DETECTED Final    Comment: (NOTE) The Coronavirus on the Respiratory Panel, DOES NOT test for the novel  Coronavirus (2019 nCoV)    Coronavirus HKU1 NOT DETECTED NOT DETECTED Final   Coronavirus NL63 NOT DETECTED NOT DETECTED Final   Coronavirus OC43 NOT DETECTED NOT DETECTED Final   Metapneumovirus NOT DETECTED NOT DETECTED Final   Rhinovirus / Enterovirus DETECTED (A) NOT DETECTED Final   Influenza A NOT DETECTED NOT DETECTED Final   Influenza B NOT DETECTED NOT DETECTED Final   Parainfluenza Virus 1 NOT DETECTED NOT DETECTED Final   Parainfluenza Virus 2 NOT DETECTED NOT DETECTED Final   Parainfluenza Virus 3 NOT DETECTED NOT DETECTED Final   Parainfluenza Virus 4 NOT DETECTED NOT DETECTED Final   Respiratory Syncytial Virus NOT DETECTED NOT DETECTED Final   Bordetella pertussis NOT DETECTED NOT DETECTED Final   Bordetella Parapertussis NOT DETECTED NOT DETECTED Final   Chlamydophila pneumoniae NOT DETECTED NOT DETECTED Final   Mycoplasma pneumoniae NOT DETECTED NOT DETECTED Final    Comment: Performed at [facility], [address], [place], [place] [address]   Pertinent Lab seen by me:    Latest Ref Rng & Units 06/30/2023    3:49 AM 06/29/2023    4:23 AM 06/28/2023    7:40 PM  CBC  WBC 4.0 - 10.5 K/uL 5.3  5.6    Hemoglobin 13.0 - 17.0 g/dL 7.6  7.6  7.6   Hematocrit 39.0 - 52.0 % 24.3  24.2  24.2   Platelets 150 - 400 K/uL 141  159        Latest Ref Rng & Units 06/30/2023    3:49 AM 06/29/2023    4:23 AM 06/28/2023    5:00 AM  CMP  Glucose 70 - 99 mg/dL 536  644  034   BUN 8 - 23 mg/dL 41  39  33   Creatinine 0.61 - 1.24 mg/dL 7.42  5.95  6.38   Sodium 135 - 145 mmol/L 137  136  134   Potassium 3.5 - 5.1 mmol/L 4.0  3.9  4.1  Chloride 98 - 111 mmol/L 105  106  104   CO2 22 - 32 mmol/L 21  19  22    Calcium 8.9 - 10.3 mg/dL 7.8  8.2  8.0   Total Protein 6.5 - 8.1 g/dL 7.2  7.3  7.4   Total Bilirubin  0.0 - 1.2 mg/dL 0.9  0.5  0.7   Alkaline Phos 38 - 126 U/L 153  159  105   AST 15 - 41 U/L 48  57  30   ALT 0 - 44 U/L 59  59  37    Pertinent Imagings/Other Imagings Plain films and CT images have been personally visualized and interpreted; radiology reports have been reviewed. Decision making incorporated into the Impression / Recommendations.  CT ABDOMEN PELVIS WO CONTRAST Result Date: 06/27/2023 CLINICAL DATA:  Fever. EXAM: CT ABDOMEN AND PELVIS WITHOUT CONTRAST TECHNIQUE: Multidetector CT imaging of the abdomen and pelvis was performed following the standard protocol without IV contrast. RADIATION DOSE REDUCTION: This exam was performed according to the departmental dose-optimization program which includes automated exposure control, adjustment of the mA and/or kV according to patient size and/or use of iterative reconstruction technique. COMPARISON:  April 13, 2023. FINDINGS: Lower chest: No acute abnormality. Hepatobiliary: No focal liver abnormality is seen. Status post cholecystectomy. No biliary dilatation. Pancreas: Unremarkable. No pancreatic ductal dilatation or surrounding inflammatory changes. Spleen: Normal in size without focal abnormality. Adrenals/Urinary Tract: Adrenal glands appear normal. Patient is status post cystectomy with ileal conduit placement. Mild bilateral hydroureteronephrosis is noted without obstructing calculus. Stable left renal cysts are noted. Stomach/Bowel: The stomach is unremarkable. There is no evidence of bowel obstruction or inflammation. Vascular/Lymphatic: Aortic atherosclerosis. No enlarged abdominal or pelvic lymph nodes. Reproductive: Status post prostatectomy. Other: No ascites or hernia is noted. Musculoskeletal: No acute or significant osseous findings. IMPRESSION: Status post cystectomy with ileal conduit placement, as well as prostatectomy. Mild bilateral hydroureteronephrosis is noted without obstructing calculus. Aortic Atherosclerosis  (ICD10-I70.0). Electronically Signed   By: Lupita Raider M.D.   On: 06/27/2023 15:28   DG Chest Port 1 View Result Date: 06/27/2023 CLINICAL DATA:  Fevers EXAM: PORTABLE CHEST 1 VIEW COMPARISON:  05/16/2023 CT FINDINGS: Cardiac shadow is within normal limits. Lungs are well aerated bilaterally. Postsurgical changes are seen. Right chest wall port is noted in satisfactory position. No bony abnormality is noted. IMPRESSION: No active disease. Electronically Signed   By: Alcide Clever M.D.   On: 06/27/2023 00:06   CARDIAC CATHETERIZATION Result Date: 06/15/2023 1. Normal PCWP, elevated RA pressure.  R>L heart failure. 2. Normal (high) CO 3. Preserved PAPi Start Lasix 20 mg po daily.   US RENAL Result Date: 05/31/2023 CLINICAL DATA:  Renal insufficiency with elevated creatinine levels. Previous cystectomy and ileal conduit. Previous right ureteral stent. EXAM: RENAL / URINARY TRACT ULTRASOUND COMPLETE COMPARISON:  Right upper quadrant abdominal ultrasound dated 06/04/2011. Abdomen and pelvis CT dated 04/13/2023. FINDINGS: Right Kidney: Renal measurements: 12.7 x 5.7 x 5.5 cm = volume: 206 mL. Normal echotexture. Mild-to-moderate dilatation of the renal collecting system, similar to the previous CT. Left Kidney: Renal measurements: 13.0 x 5.7 x 5.7 cm = volume: 194 mL. Normal echotexture. Mild-to-moderate dilatation of the renal collecting system, similar to the previous CT. Exophytic simple cysts, unchanged. These do not need imaging follow-up. Bladder: Surgically absent. Other: Diffusely echogenic liver. Corresponding mild low density on the previous CT. IMPRESSION: 1. Mild-to-moderate bilateral hydronephrosis, similar to the previous CT. 2. Diffuse hepatic steatosis. Electronically Signed   By:  Beckie Salts M.D.   On: 05/31/2023 15:05   I have personally spent 85 minutes involved in face-to-face and non-face-to-face activities for this patient on the day of the visit. Professional time spent includes the  following activities: Preparing to see the patient (review of tests), Obtaining and/or reviewing separately obtained history (admission/discharge record), Performing a medically appropriate examination and/or evaluation , Ordering medications/tests/procedures, referring and communicating with other health care professionals, Documenting clinical information in the EMR, Independently interpreting results (not separately reported), Communicating results to the patient/family/caregiver, Counseling and educating the patient/family/caregiver and Care coordination (not separately reported).  Electronically signed by:   Plan d/w requesting provider as well as ID pharm D  Of note, portions of this note may have been created with voice recognition software. While this note has been edited for accuracy, occasional wrong-word or 'sound-a-like' substitutions may have occurred due to the inherent limitations of voice recognition software.   Odette Fraction, MD Infectious Disease Physician [facility] Pager: [phone]

## 2023-06-30 NOTE — Plan of Care (Signed)

## 2023-06-30 NOTE — Progress Notes (Signed)
 PROGRESS NOTE    DEMON VOLANTE  ZOX:[id number] DOB: [birth year] DOA: 06/26/2023 PCP: Nelwyn Salisbury, MD  Chief Complaint  Patient presents with   Fever    Brief Narrative:   68 year old male with history of high-grade bladder cancer status post cystoprostatectomy with urostomy in place, currently undergoing radiation therapy started Luis [name] week prior to admission, chronic diastolic CHF, CAD with PCI to RCA, severe AAS status post bioprosthetic AVR and ascending aorta replacement, HTN, HLD, OSA on CPAP who comes into the hospital with fevers over the last 3 to 4 days.  Patient has Kasten Leveque recent history of septic shock due to Enterococcus bacteremia in December 2024, completed several weeks of IV ampicillin, and at that time there was no evidence of TEE and no infection of his aortic graft on the CT scan of the chest.  Current symptoms remind him of his prior episode and he decided to seek care.  In the ED he was febrile, but importantly was also found to be anemic with Luis [name] hemoglobin in the 5 range.  Patient is reported dark stools intermittently over the last couple of weeks   Assessment & Plan:   Principal Problem:   SIRS (systemic inflammatory response syndrome) (HCC) Active Problems:   Acute on chronic anemia   Renal dysfunction   CAD (coronary artery disease)   Hyperlipidemia   OSA (obstructive sleep apnea)   Chronic diastolic CHF (congestive heart failure) (HCC)   Essential hypertension   Bladder cancer (HCC)   History of aortic valve replacement with tissue graft  Sepsis due to Klebsiella Bacteremia Presented with fever, leukocytosis, and now with klebsiella bacteremia CT abdomen/pelvis with mild bilateral hydroureteronephrosis (cystectomy with ileal conduit placement and prostatectomy) UA could be c/w with UTI, but not overwhelmingly so -> urine culture multiple species Most likely source would seem to be urinary Follow final culture results and adjust abx based on sensitivities ->  transition to augmentin Will discuss with ID given his recurrent bacteremia -> recommending 2 weeks augmentin, follow up outpatient with ID  Acute kidney injury on CKD stage IV-baseline creatinine between 2 and 2.3, on admission it was 2.7 in the setting of febrile illness.  Creatinine worse today to 3.25 Will trend, mild hydro on imaging IVF Appreciate urology assistance -> foley placed to allow urine to drain, may need repeat imaging (loopogram or nephrostomy tubes with antegrade nephrostograms) (discussed over phone with Dr. Mena Goes today, they're continuing to follow, considering nephrostomy tubes)  Rhinovirus Infection Seems mild, follow  Anemia, concern for melena -patient reports several weeks of intermittent melena, currently he is without frank bleeding and fecal occult is negative.   -Gastroenterology consulted, appreciate input - > s/p EGD with nonbleeding gastric ulcers without stigmata of bleeding (biopsied), continued PPI daily - follow pathology Transfuse as needed or for Hb <7   High-grade bladder cancer-status post total cystectomy with ileal conduit as well as prostatectomy-follows with oncology Dr. [name], recently started radiation therapy Luis [name] week prior to this admission and has completed 6 cycles.  Hyponatremia -monitor, in the setting of CKD, Lasix   Chronic diastolic CHF-continue Coreg, hold Lasix for now as he is febrile and normotensive   Hyperlipidemia-continue Crestor   Essential hypertension-continue amlodipine and carvedilol, hold Lasix as above   CAD-with history of PCI to RCA, stable, denies chest pain.  Holding aspirin with his anemia.  Continue statin   OSA on CPAP-continue CPAP   Status post bioprosthetic AVR, ascending aorta replacement -noted, will watch blood cultures and  repeat imaging if he is bacteremic again    DVT prophylaxis: SCD Code Status: full Family Communication: wife at bedside Disposition:   Status is: Inpatient Remains  inpatient appropriate because: need for follow up culture results, ongoing care   Consultants:  GI  Procedures:  EGD  Antimicrobials:  Anti-infectives (From admission, onward)    Start     Dose/Rate Route Frequency Ordered Stop   06/29/23 2200  amoxicillin-clavulanate (AUGMENTIN) 500-125 MG per tablet 1 tablet        1 tablet Oral 2 times daily 06/29/23 1516 07/10/23 2359   06/27/23 1700  cefTRIAXone (ROCEPHIN) 2 g in sodium chloride 0.9 % 100 mL IVPB  Status:  Discontinued        2 g 200 mL/hr over 30 Minutes Intravenous Daily-1600 06/27/23 1613 06/27/23 1613   06/27/23 1700  cefTRIAXone (ROCEPHIN) 2 g in sodium chloride 0.9 % 100 mL IVPB  Status:  Discontinued        2 g 200 mL/hr over 30 Minutes Intravenous Daily-1600 06/27/23 1614 06/29/23 1516   06/26/23 2330  Ampicillin-Sulbactam (UNASYN) 3 g in sodium chloride 0.9 % 100 mL IVPB  Status:  Discontinued        3 g 200 mL/hr over 30 Minutes Intravenous Every 8 hours 06/26/23 2318 06/27/23 1613       Subjective: Denies any new complaint  Objective: Vitals:   06/29/23 1935 06/29/23 2000 06/30/23 0515 06/30/23 1318  BP: 132/69  135/79 (!) 115/57  Pulse: 61  (!) 57 (!) 54  Resp: 20 19 18 20   Temp: 99 F (37.2 C)  98.1 F (36.7 C) 98 F (36.7 C)  TempSrc: Oral   Oral  SpO2: 100%  100% 100%  Weight:      Height:        Intake/Output Summary (Last 24 hours) at 06/30/2023 1556 Last data filed at 06/30/2023 1320 Gross per 24 hour  Intake 1970.82 ml  Output 1800 ml  Net 170.82 ml   Filed Weights   06/27/23 0130 06/28/23 1316  Weight: 112 kg 112 kg    Examination:  General: No acute distress. Cardiovascular: RRR Lungs: unlabored Abdomen: urostomy - s/nt/nd Neurological: Alert and oriented 3. Moves all extremities 4 . Cranial nerves II through XII grossly intact. Extremities: No clubbing or cyanosis. No edema.   Data Reviewed: I have personally reviewed following labs and imaging studies  CBC: Recent Labs   Lab 06/26/23 2100 06/27/23 1105 06/28/23 0500 06/28/23 1940 06/29/23 0423 06/30/23 0349  WBC 12.3* 9.0 6.6  --  5.6 5.3  NEUTROABS 10.9*  --   --   --  4.1 3.7  HGB 5.8* 8.1* 7.6* 7.6* 7.6* 7.6*  HCT 18.0* 25.3* 24.8* 24.2* 24.2* 24.3*  MCV 89.1 87.5 90.8  --  89.6 89.3  PLT 252 181 148*  --  159 141*    Basic Metabolic Panel: Recent Labs  Lab 06/26/23 2100 06/27/23 1105 06/28/23 0500 06/29/23 0423 06/30/23 0349  NA 129* 134* 134* 136 137  K 4.4 4.4 4.1 3.9 4.0  CL 101 105 104 106 105  CO2 18* 20* 22 19* 21*  GLUCOSE 191* 162* 113* 117* 108*  BUN 30* 32* 33* 39* 41*  CREATININE 2.26* 2.59* 2.69* 3.01* 3.25*  CALCIUM 8.0* 7.9* 8.0* 8.2* 7.8*  MG  --   --  2.4 2.2 2.2  PHOS  --   --  4.7* 4.8* 5.1*    GFR: Estimated Creatinine Clearance: 25.2 mL/min (Konnor Vondrasek) (by C-G  formula based on SCr of 3.25 mg/dL (H)).  Liver Function Tests: Recent Labs  Lab 06/26/23 2100 06/27/23 1105 06/28/23 0500 06/29/23 0423 06/30/23 0349  AST 32 29 30 57* 48*  ALT 27 29 37 59* 59*  ALKPHOS 101 100 105 159* 153*  BILITOT 0.8 1.2 0.7 0.5 0.9  PROT 7.8 7.4 7.4 7.3 7.2  ALBUMIN 2.8* 2.5* 2.4* 2.4* 2.3*    CBG: No results for input(s): "GLUCAP" in the last 168 hours.   Recent Results (from the past 240 hours)  Resp panel by RT-PCR (RSV, Flu Teighlor Korson&B, Covid)     Status: None   Collection Time: 06/26/23  9:00 PM   Specimen: Nasal Swab  Result Value Ref Range Status   SARS Coronavirus 2 by RT PCR NEGATIVE NEGATIVE Final    Comment: (NOTE) SARS-CoV-2 target nucleic acids are NOT DETECTED.  The SARS-CoV-2 RNA is generally detectable in upper respiratory specimens during the acute phase of infection. The lowest concentration of SARS-CoV-2 viral copies this assay can detect is 138 copies/mL. Jamyria Ozanich negative result does not preclude SARS-Cov-2 infection and should not be used as the sole basis for treatment or other patient management decisions. Abdoulie Tierce negative result may occur with  improper  specimen collection/handling, submission of specimen other than nasopharyngeal swab, presence of viral mutation(s) within the areas targeted by this assay, and inadequate number of viral copies(<138 copies/mL). Deauna Yaw negative result must be combined with clinical observations, patient history, and epidemiological information. The expected result is Negative.  Fact Sheet for Patients:  BloggerCourse.com  Fact Sheet for Healthcare Providers:  SeriousBroker.it  This test is no t yet approved or cleared by the [place] FDA and  has been authorized for detection and/or diagnosis of SARS-CoV-2 by FDA under an Emergency Use Authorization (EUA). This EUA will remain  in effect (meaning this test can be used) for the duration of the COVID-19 declaration under Section 564(b)(1) of the Act, 21 U.S.C.section 360bbb-3(b)(1), unless the authorization is terminated  or revoked sooner.       Influenza Quan Cybulski by PCR NEGATIVE NEGATIVE Final   Influenza B by PCR NEGATIVE NEGATIVE Final    Comment: (NOTE) The Xpert Xpress SARS-CoV-2/FLU/RSV plus assay is intended as an aid in the diagnosis of influenza from Nasopharyngeal swab specimens and should not be used as [place] sole basis for treatment. Nasal washings and aspirates are unacceptable for Xpert Xpress SARS-CoV-2/FLU/RSV testing.  Fact Sheet for Patients: BloggerCourse.com  Fact Sheet for Healthcare Providers: SeriousBroker.it  This test is not yet approved or cleared by the [place] FDA and has been authorized for detection and/or diagnosis of SARS-CoV-2 by FDA under an Emergency Use Authorization (EUA). This EUA will remain in effect (meaning this test can be used) for the duration of the COVID-19 declaration under Section 564(b)(1) of the Act, 21 U.S.C. section 360bbb-3(b)(1), unless the authorization is terminated or revoked.     Resp  Syncytial Virus by PCR NEGATIVE NEGATIVE Final    Comment: (NOTE) Fact Sheet for Patients: BloggerCourse.com  Fact Sheet for Healthcare Providers: SeriousBroker.it  This test is not yet approved or cleared by the [place] FDA and has been authorized for detection and/or diagnosis of SARS-CoV-2 by FDA under an Emergency Use Authorization (EUA). This EUA will remain in effect (meaning this test can be used) for the duration of the COVID-19 declaration under Section 564(b)(1) of the Act, 21 U.S.C. section 360bbb-3(b)(1), unless the authorization is terminated or revoked.  Performed at [facility],  [address], [place], [place] [address]   Blood Culture (routine x 2)     Status: Abnormal   Collection Time: 06/26/23  9:00 PM   Specimen: BLOOD  Result Value Ref Range Status   Specimen Description   Final    BLOOD BLOOD RIGHT HAND Performed at [facility], [address], [place], [place] [address]    Special Requests   Final    BOTTLES DRAWN AEROBIC AND ANAEROBIC Blood Culture results may not be optimal due to an inadequate volume of blood received in culture bottles Performed at [facility], [address] [place], [place] [address]    Culture  Setup Time   Final    GRAM NEGATIVE RODS ANAEROBIC BOTTLE ONLY CRITICAL RESULT CALLED TO, READ BACK BY AND VERIFIED WITH: Roni Bread [id number] @ 1549 FH Performed at [facility], [address] [place], [place] [address]    Culture KLEBSIELLA OXYTOCA (Kaylianna Detert)  Final   Report Status 06/29/2023 FINAL  Final   Organism ID, Bacteria KLEBSIELLA OXYTOCA  Final      Susceptibility   Klebsiella oxytoca - MIC*    AMPICILLIN RESISTANT Resistant     CEFEPIME <=0.12 SENSITIVE Sensitive     CEFTAZIDIME <=1 SENSITIVE Sensitive     CEFTRIAXONE <=0.25 SENSITIVE Sensitive     CIPROFLOXACIN <=0.25 SENSITIVE Sensitive      GENTAMICIN <=1 SENSITIVE Sensitive     IMIPENEM 0.5 SENSITIVE Sensitive     TRIMETH/SULFA <=20 SENSITIVE Sensitive     AMPICILLIN/SULBACTAM 4 SENSITIVE Sensitive     PIP/TAZO <=4 SENSITIVE Sensitive ug/mL    * KLEBSIELLA OXYTOCA  Blood Culture (routine x 2)     Status: None (Preliminary result)   Collection Time: 06/26/23  9:00 PM   Specimen: BLOOD LEFT HAND  Result Value Ref Range Status   Specimen Description   Final    BLOOD LEFT HAND Performed at [facility], [address] [place], [place] [address]    Special Requests   Final    BOTTLES DRAWN AEROBIC AND ANAEROBIC Blood Culture results may not be optimal due to an inadequate volume of blood received in culture bottles Performed at [facility], [address], [place], [place] [address]    Culture   Final    NO GROWTH 4 DAYS Performed at [facility], [address], [place], [place] [address]    Report Status PENDING  Incomplete  Blood Culture ID Panel (Reflexed)     Status: Abnormal   Collection Time: 06/26/23  9:00 PM  Result Value Ref Range Status   Enterococcus faecalis NOT DETECTED NOT DETECTED Final   Enterococcus Faecium NOT DETECTED NOT DETECTED Final   Listeria monocytogenes NOT DETECTED NOT DETECTED Final   Staphylococcus species NOT DETECTED NOT DETECTED Final   Staphylococcus aureus (BCID) NOT DETECTED NOT DETECTED Final   Staphylococcus epidermidis NOT DETECTED NOT DETECTED Final   Staphylococcus lugdunensis NOT DETECTED NOT DETECTED Final   Streptococcus species NOT DETECTED NOT DETECTED Final   Streptococcus agalactiae NOT DETECTED NOT DETECTED Final   Streptococcus pneumoniae NOT DETECTED NOT DETECTED Final   Streptococcus pyogenes NOT DETECTED NOT DETECTED Final   Kiara Mcdowell.calcoaceticus-baumannii NOT DETECTED NOT DETECTED Final   Bacteroides fragilis NOT DETECTED NOT DETECTED Final   Enterobacterales DETECTED (Kamie Korber) NOT DETECTED Final    Comment: Enterobacterales represent Silena Wyss large  order of gram negative bacteria, not Kimbly Eanes single organism. CRITICAL RESULT CALLED TO,  READ BACK BY AND VERIFIED WITH: PHARMD D. WOFFORD [id number] @ 1549 FH    Enterobacter cloacae complex NOT DETECTED NOT DETECTED Final   Escherichia coli NOT DETECTED NOT DETECTED Final   Klebsiella aerogenes NOT DETECTED NOT DETECTED Final   Klebsiella oxytoca DETECTED (Louie Flenner) NOT DETECTED Final    Comment: CRITICAL RESULT CALLED TO, READ BACK BY AND VERIFIED WITH: PHARMD D. ONGEXBM [id number] @ 1549 FH    Klebsiella pneumoniae NOT DETECTED NOT DETECTED Final   Proteus species NOT DETECTED NOT DETECTED Final   Salmonella species NOT DETECTED NOT DETECTED Final   Serratia marcescens NOT DETECTED NOT DETECTED Final   Haemophilus influenzae NOT DETECTED NOT DETECTED Final   Neisseria meningitidis NOT DETECTED NOT DETECTED Final   Pseudomonas aeruginosa NOT DETECTED NOT DETECTED Final   Stenotrophomonas maltophilia NOT DETECTED NOT DETECTED Final   Candida albicans NOT DETECTED NOT DETECTED Final   Candida auris NOT DETECTED NOT DETECTED Final   Candida glabrata NOT DETECTED NOT DETECTED Final   Candida krusei NOT DETECTED NOT DETECTED Final   Candida parapsilosis NOT DETECTED NOT DETECTED Final   Candida tropicalis NOT DETECTED NOT DETECTED Final   Cryptococcus neoformans/gattii NOT DETECTED NOT DETECTED Final   CTX-M ESBL NOT DETECTED NOT DETECTED Final   Carbapenem resistance IMP NOT DETECTED NOT DETECTED Final   Carbapenem resistance KPC NOT DETECTED NOT DETECTED Final   Carbapenem resistance NDM NOT DETECTED NOT DETECTED Final   Carbapenem resist OXA 48 LIKE NOT DETECTED NOT DETECTED Final   Carbapenem resistance VIM NOT DETECTED NOT DETECTED Final    Comment: Performed at [facility], [address], [place], [place] [address]  Urine Culture     Status: Abnormal   Collection Time: 06/27/23 12:24 AM   Specimen: Urine, Random  Result Value Ref Range Status   Specimen Description   Final    URINE,  RANDOM Performed at [facility], [address] [place], [place] [address]    Special Requests   Final    NONE Reflexed from [id number] Performed at [facility], [address], [place], [place] [address]    Culture MULTIPLE SPECIES PRESENT, SUGGEST RECOLLECTION (Jomar Denz)  Final   Report Status 06/28/2023 FINAL  Final  Respiratory (~20 pathogens) panel by PCR     Status: Abnormal   Collection Time: 06/27/23  6:28 AM   Specimen: Nasopharyngeal Swab; Respiratory  Result Value Ref Range Status   Adenovirus NOT DETECTED NOT DETECTED Final   Coronavirus 229E NOT DETECTED NOT DETECTED Final    Comment: (NOTE) The Coronavirus on the Respiratory Panel, DOES NOT test for the novel  Coronavirus (2019 nCoV)    Coronavirus HKU1 NOT DETECTED NOT DETECTED Final   Coronavirus NL63 NOT DETECTED NOT DETECTED Final   Coronavirus OC43 NOT DETECTED NOT DETECTED Final   Metapneumovirus NOT DETECTED NOT DETECTED Final   Rhinovirus / Enterovirus DETECTED (Jamiesha Victoria) NOT DETECTED Final   Influenza Sunny Aguon NOT DETECTED NOT DETECTED Final   Influenza B NOT DETECTED NOT DETECTED Final   Parainfluenza Virus 1 NOT DETECTED NOT DETECTED Final   Parainfluenza Virus 2 NOT DETECTED NOT DETECTED Final   Parainfluenza Virus 3 NOT DETECTED NOT DETECTED Final   Parainfluenza Virus 4 NOT DETECTED NOT DETECTED Final   Respiratory Syncytial Virus NOT DETECTED NOT DETECTED Final   Bordetella pertussis NOT DETECTED NOT DETECTED Final   Bordetella Parapertussis NOT DETECTED NOT DETECTED Final   Chlamydophila pneumoniae NOT DETECTED NOT DETECTED Final  Mycoplasma pneumoniae NOT DETECTED NOT DETECTED Final    Comment: Performed at [facility], [address] [place], [place] [address]         Radiology Studies: No results found.       Scheduled Meds:  sodium chloride   Intravenous Once   amLODipine  5 mg Oral Daily   amoxicillin-clavulanate  1 tablet Oral BID   carvedilol  3.125 mg  Oral BID WC   Chlorhexidine Gluconate Cloth  6 each Topical Daily   pantoprazole  40 mg Oral Daily   potassium chloride SA  20 mEq Oral Daily   rosuvastatin  20 mg Oral QHS   sodium chloride flush  10-40 mL Intracatheter Q12H   sodium chloride flush  3 mL Intravenous Q12H   venlafaxine XR  150 mg Oral Q breakfast   Continuous Infusions:  lactated ringers Stopped (06/30/23 1035)     LOS: 3 days    Time spent: over 30 min    Lacretia Nicks, MD [facility]   To contact the attending provider between 7A-7P or the covering provider during after hours 7P-7A, please log into the web site [email or web address] and access using universal [place] password for that web site. If you do not have the password, please call the hospital operator.  06/30/2023, 3:56 PM

## 2023-07-01 ENCOUNTER — Encounter (HOSPITAL_COMMUNITY): Payer: Self-pay | Admitting: Internal Medicine

## 2023-07-01 ENCOUNTER — Inpatient Hospital Stay (HOSPITAL_COMMUNITY)

## 2023-07-01 DIAGNOSIS — R6510 Systemic inflammatory response syndrome (SIRS) of non-infectious origin without acute organ dysfunction: Secondary | ICD-10-CM | POA: Diagnosis not present

## 2023-07-01 HISTORY — PX: IR NEPHROSTOMY PLACEMENT RIGHT: IMG6064

## 2023-07-01 HISTORY — PX: IR RADIOLOGY PERIPHERAL GUIDED IV START: IMG5598

## 2023-07-01 HISTORY — PX: IR US GUIDE VASC ACCESS LEFT: IMG2389

## 2023-07-01 LAB — CULTURE, BLOOD (ROUTINE X 2): Culture: NO GROWTH

## 2023-07-01 LAB — BASIC METABOLIC PANEL
Anion gap: 10 (ref 5–15)
BUN: 39 mg/dL — ABNORMAL HIGH (ref 8–23)
CO2: 18 mmol/L — ABNORMAL LOW (ref 22–32)
Calcium: 7.8 mg/dL — ABNORMAL LOW (ref 8.9–10.3)
Chloride: 106 mmol/L (ref 98–111)
Creatinine, Ser: 3.8 mg/dL — ABNORMAL HIGH (ref 0.61–1.24)
GFR, Estimated: 16 mL/min — ABNORMAL LOW (ref >60)
Glucose, Bld: 106 mg/dL — ABNORMAL HIGH (ref 70–99)
Potassium: 3.9 mmol/L (ref 3.5–5.1)
Sodium: 134 mmol/L — ABNORMAL LOW (ref 135–145)

## 2023-07-01 LAB — CBC
HCT: 23.8 % — ABNORMAL LOW (ref 39.0–52.0)
Hemoglobin: 7.5 g/dL — ABNORMAL LOW (ref 13.0–17.0)
MCH: 28.2 pg (ref 26.0–34.0)
MCHC: 31.5 g/dL (ref 30.0–36.0)
MCV: 89.5 fL (ref 80.0–100.0)
Platelets: 126 10*3/uL — ABNORMAL LOW (ref 150–400)
RBC: 2.66 MIL/uL — ABNORMAL LOW (ref 4.22–5.81)
RDW: 15.6 % — ABNORMAL HIGH (ref 11.5–15.5)
WBC: 5.3 10*3/uL (ref 4.0–10.5)
nRBC: 0 % (ref 0.0–0.2)

## 2023-07-01 LAB — PHOSPHORUS: Phosphorus: 5.5 mg/dL — ABNORMAL HIGH (ref 2.5–4.6)

## 2023-07-01 LAB — MAGNESIUM: Magnesium: 2 mg/dL (ref 1.7–2.4)

## 2023-07-01 LAB — PROTIME-INR
INR: 1.2 (ref 0.8–1.2)
Prothrombin Time: 15.5 s — ABNORMAL HIGH (ref 11.4–15.2)

## 2023-07-01 LAB — PREPARE RBC (CROSSMATCH)

## 2023-07-01 MED ORDER — LACTATED RINGERS IV SOLN: INTRAVENOUS | Status: AC

## 2023-07-01 MED ORDER — SODIUM CHLORIDE 0.9% IV SOLUTION: Freq: Once | INTRAVENOUS | Status: AC

## 2023-07-01 MED ORDER — LIDOCAINE HCL 1 % IJ SOLN
20.0000 mL | Freq: Once | INTRAMUSCULAR | Status: AC
  Administered 2023-07-01: 20 mL
  Filled 2023-07-01: qty 20

## 2023-07-01 MED ORDER — LIDOCAINE HCL 1 % IJ SOLN
INTRAMUSCULAR | Status: AC
  Filled 2023-07-01: qty 40

## 2023-07-01 MED ORDER — SODIUM CHLORIDE 0.9 % IV SOLN
3.0000 g | Freq: Two times a day (BID) | INTRAVENOUS | Status: DC
  Administered 2023-07-01 – 2023-07-03 (×4): 3 g via INTRAVENOUS
  Filled 2023-07-01 (×5): qty 8

## 2023-07-01 MED ORDER — FENTANYL CITRATE (PF) 100 MCG/2ML IJ SOLN
INTRAMUSCULAR | Status: AC
  Filled 2023-07-01: qty 2

## 2023-07-01 MED ORDER — HYDROMORPHONE HCL 1 MG/ML IJ SOLN
0.5000 mg | INTRAMUSCULAR | Status: DC | PRN
  Administered 2023-07-01 – 2023-07-02 (×4): 0.5 mg via INTRAVENOUS
  Filled 2023-07-01 (×5): qty 0.5

## 2023-07-01 MED ORDER — IOHEXOL 300 MG/ML  SOLN
50.0000 mL | Freq: Once | INTRAMUSCULAR | Status: AC | PRN
  Administered 2023-07-01: 15 mL

## 2023-07-01 MED ORDER — MIDAZOLAM HCL 2 MG/2ML IJ SOLN
INTRAMUSCULAR | Status: AC
  Filled 2023-07-01: qty 2

## 2023-07-01 MED ORDER — MIDAZOLAM HCL 2 MG/2ML IJ SOLN
INTRAMUSCULAR | Status: AC | PRN
  Administered 2023-07-01 (×2): 1 mg via INTRAVENOUS

## 2023-07-01 MED ORDER — FENTANYL CITRATE (PF) 100 MCG/2ML IJ SOLN
INTRAMUSCULAR | Status: AC | PRN
  Administered 2023-07-01 (×2): 25 ug via INTRAVENOUS
  Administered 2023-07-01: 50 ug via INTRAVENOUS

## 2023-07-01 MED ORDER — LIDOCAINE HCL 1 % IJ SOLN
20.0000 mL | Freq: Once | INTRAMUSCULAR | Status: AC
  Administered 2023-07-01: 10 mL
  Filled 2023-07-01: qty 20

## 2023-07-01 NOTE — Plan of Care (Signed)
  Problem: Fluid Volume: Goal: Hemodynamic stability will improve Outcome: Progressing   Problem: Clinical Measurements: Goal: Diagnostic test results will improve Outcome: Progressing Goal: Signs and symptoms of infection will decrease Outcome: Progressing   Problem: Respiratory: Goal: Ability to maintain adequate ventilation will improve Outcome: Progressing   Problem: Education: Goal: Knowledge of General Education information will improve Description: Including pain rating scale, medication(s)/side effects and non-pharmacologic comfort measures Outcome: Progressing   Problem: Health Behavior/Discharge Planning: Goal: Ability to manage health-related needs will improve Outcome: Progressing   Problem: Clinical Measurements: Goal: Will remain free from infection Outcome: Progressing Goal: Diagnostic test results will improve Outcome: Progressing Goal: Respiratory complications will improve Outcome: Progressing   Problem: Activity: Goal: Risk for activity intolerance will decrease Outcome: Progressing   Problem: Nutrition: Goal: Adequate nutrition will be maintained Outcome: Progressing   Problem: Elimination: Goal: Will not experience complications related to bowel motility Outcome: Progressing Goal: Will not experience complications related to urinary retention Outcome: Progressing   Problem: Safety: Goal: Ability to remain free from injury will improve Outcome: Progressing   Problem: Skin Integrity: Goal: Risk for impaired skin integrity will decrease Outcome: Progressing

## 2023-07-01 NOTE — Progress Notes (Signed)
 Pharmacy Antibiotic Note  Luis [name] is a 68 y.o. male with bladder cancer (s/p cystoprostatectomy with urostomy) and hx enterococcus bacteremia in Dec 2024 (treated with ampicillin IV x 4 weeks) who presented to the ED  on 06/26/2023 with fever.  He was started on unasyn on admission for sepsis, switched to ceftriaxone 3/4-3/6, and changed to augmentin on 3/6 per ID for klebsiella oxytoca bacteremia with recommendation to treat for 2 weeks (EOT 07/09/23). He underwent bilateral PCN on 07/01/23.  Pharmacy has been consulted after procedure to change abx back to unasyn.  Plan: - unasyn 3gm IV q12h for crcl 15-29  _________________________________________  Height: 5\' 6"  (167.6 cm) Weight: 112 kg (246 lb 14.6 oz) IBW/kg (Calculated) : 63.8  Temp (24hrs), Avg:97.9 F (36.6 C), Min:97.5 F (36.4 C), Max:98.7 F (37.1 C)  Recent Labs  Lab 06/26/23 2053 06/26/23 2054 06/27/23 0120 06/27/23 1105 06/28/23 0500 06/29/23 0423 06/30/23 0349 07/01/23 0356  WBC  --    < >  --  9.0 6.6 5.6 5.3 5.3  CREATININE  --    < >  --  2.59* 2.69* 3.01* 3.25* 3.80*  LATICACIDVEN 2.1*  --  0.4*  --   --   --   --   --    < > = values in this interval not displayed.    Estimated Creatinine Clearance: 21.6 mL/min (A) (by C-G formula based on SCr of 3.8 mg/dL (H)).    No Known Allergies  Thank you for allowing pharmacy to be a part of this patient's care.  Lucia Gaskins 07/01/2023 6:03 PM

## 2023-07-01 NOTE — Procedures (Signed)
 Interventional Radiology Procedure Note  Procedure: Placement of a left arm basilic vein US guided venipuncture.  Indication:  Need for durable IV access, with multiple failure of standard IV  Complications: None  Recommendations:  - Ok to use -Routine IV care    Signed,  Yvone Neu. Loreta Ave, DO

## 2023-07-01 NOTE — Procedures (Addendum)
 Interventional Radiology Procedure Note  Procedure:   Image guided bilateral PCN.  48F drain to gravity  Findings: Hydro  Specimen: #1, left #2, right  Complications: None  Recommendations:  - Bilateral PCN to gravity - some hematuria, will resolve in 24-48 hours. - VIR to follow - cultures pending - Do not submerge - Routine drain care  - ok to advance diet per primary  Signed,  Yvone Neu. Loreta Ave, DO

## 2023-07-01 NOTE — Progress Notes (Signed)
   07/01/23 0200  BiPAP/CPAP/SIPAP  BiPAP/CPAP/SIPAP Pt Type Adult  Reason BIPAP/CPAP not in use Other(comment) (pt did not want to wear)

## 2023-07-01 NOTE — Treatment Plan (Signed)
 Catheter fell out earlier this evening. Balloon still inflated. I deflated the balloon, and reinserted the catheter 4-5 inches beyond the fascia, and inflated with 4 cc of NS - easily mobile, and clearly beyond the abdominal fascia. Will continue catheter overnight, if downtrending creatinine, will consider removal tomorrow.   Roby Lofts, MD Resident Physician [facility]

## 2023-07-01 NOTE — Progress Notes (Signed)
 PROGRESS NOTE    Luis [name]  NWG:[id number] DOB: [birth year] DOA: 06/26/2023 PCP: Nelwyn Salisbury, MD  Chief Complaint  Patient presents with   Fever    Brief Narrative:   68 year old male with history of high-grade bladder cancer status post cystoprostatectomy with urostomy in place, currently undergoing radiation therapy started Luis [name] week prior to admission, chronic diastolic CHF, CAD with PCI to RCA, severe AAS status post bioprosthetic AVR and ascending aorta replacement, HTN, HLD, OSA on CPAP who comes into the hospital with fevers over the last 3 to 4 days.  Patient has Luis [name] recent history of septic shock due to Enterococcus bacteremia in December 2024, completed several weeks of IV ampicillin, and at that time there was no evidence of TEE and no infection of his aortic graft on the CT scan of the chest.  Current symptoms remind him of his prior episode and he decided to seek care.  In the ED he was febrile, but importantly was also found to be anemic with Luis [name] hemoglobin in the 5 range.  Patient is reported dark stools intermittently over the last couple of weeks   Assessment & Plan:   Principal Problem:   SIRS (systemic inflammatory response syndrome) (HCC) Active Problems:   Acute on chronic anemia   Renal dysfunction   CAD (coronary artery disease)   Hyperlipidemia   OSA (obstructive sleep apnea)   Chronic diastolic CHF (congestive heart failure) (HCC)   Essential hypertension   Bladder cancer (HCC)   History of aortic valve replacement with tissue graft  Sepsis due to Klebsiella Bacteremia Presented with fever, leukocytosis, and now with klebsiella bacteremia CT abdomen/pelvis with mild bilateral hydroureteronephrosis (cystectomy with ileal conduit placement and prostatectomy) UA could be c/w with UTI, but not overwhelmingly so -> urine culture multiple species Most likely source would seem to be urinary Follow final culture results and adjust abx based on sensitivities ->  transition to augmentin Will discuss with ID given his recurrent bacteremia -> recommending 2 weeks augmentin, follow up outpatient with ID  Acute kidney injury on CKD stage IV-baseline creatinine between 2 and 2.3, on admission it was 2.7 in the setting of febrile illness.  Creatinine worse today to 3.8 Will trend, mild hydro on imaging IVF Appreciate urology assistance -> plan for bilateral nephrostomy tubes  Rhinovirus Infection Seems mild, follow  Anemia, concern for melena -patient reports several weeks of intermittent melena, currently he is without frank bleeding and fecal occult is negative.   -Gastroenterology consulted, appreciate input - > s/p EGD with nonbleeding gastric ulcers without stigmata of bleeding (biopsied), continued PPI daily - follow pathology Transfuse as needed or for Hb <7 Transfusing 1 unit pRBC as IR wants Hb >8 for nephrostomy tubes   High-grade bladder cancer-status post total cystectomy with ileal conduit as well as prostatectomy-follows with oncology Dr. [name], recently started radiation therapy Luis [name] week prior to this admission and has completed 6 cycles.  Hyponatremia -monitor, in the setting of CKD, Lasix   Chronic diastolic CHF-continue Coreg, hold Lasix for now as he is febrile and normotensive   Hyperlipidemia-continue Crestor   Essential hypertension-continue amlodipine and carvedilol, hold Lasix as above   CAD-with history of PCI to RCA, stable, denies chest pain.  Holding aspirin with his anemia.  Continue statin   OSA on CPAP-continue CPAP   Status post bioprosthetic AVR, ascending aorta replacement  noted  Obesity Body mass index is 39.85 kg/m.     DVT prophylaxis: SCD Code Status: full Family  Communication: wife at bedside Disposition:   Status is: Inpatient Remains inpatient appropriate because: need for follow up culture results, ongoing care   Consultants:  GI  Procedures:  EGD  Antimicrobials:  Anti-infectives  (From admission, onward)    Start     Dose/Rate Route Frequency Ordered Stop   06/29/23 2200  amoxicillin-clavulanate (AUGMENTIN) 500-125 MG per tablet 1 tablet        1 tablet Oral 2 times daily 06/29/23 1516 07/10/23 2359   06/27/23 1700  cefTRIAXone (ROCEPHIN) 2 g in sodium chloride 0.9 % 100 mL IVPB  Status:  Discontinued        2 g 200 mL/hr over 30 Minutes Intravenous Daily-1600 06/27/23 1613 06/27/23 1613   06/27/23 1700  cefTRIAXone (ROCEPHIN) 2 g in sodium chloride 0.9 % 100 mL IVPB  Status:  Discontinued        2 g 200 mL/hr over 30 Minutes Intravenous Daily-1600 06/27/23 1614 06/29/23 1516   06/26/23 2330  Ampicillin-Sulbactam (UNASYN) 3 g in sodium chloride 0.9 % 100 mL IVPB  Status:  Discontinued        3 g 200 mL/hr over 30 Minutes Intravenous Every 8 hours 06/26/23 2318 06/27/23 1613       Subjective: Some abdominal discomfort  Objective: Vitals:   07/01/23 1329 07/01/23 1344 07/01/23 1344 07/01/23 1427  BP: 137/66 128/68 128/68 133/61  Pulse: (!) 52 (!) 50 (!) 50 63  Resp: 16 16 16 18   Temp: 97.8 F (36.6 C) 97.9 F (36.6 C)  97.8 F (36.6 C)  TempSrc: Oral     SpO2:   99% 99%  Weight:      Height:       No intake or output data in the 24 hours ending 07/01/23 1521  Filed Weights   06/27/23 0130 06/28/23 1316  Weight: 112 kg 112 kg    Examination:  General: No acute distress. Cardiovascular: RRR Lungs: unlabored Abdomen: urostomy - s/nt/nd Neurological: Alert and oriented 3. Moves all extremities 4 with equal strength. Cranial nerves II through XII grossly intact. Extremities: No clubbing or cyanosis. No edema.   Data Reviewed: I have personally reviewed following labs and imaging studies  CBC: Recent Labs  Lab 06/26/23 2100 06/27/23 1105 06/28/23 0500 06/28/23 1940 06/29/23 0423 06/30/23 0349 07/01/23 0356  WBC 12.3* 9.0 6.6  --  5.6 5.3 5.3  NEUTROABS 10.9*  --   --   --  4.1 3.7  --   HGB 5.8* 8.1* 7.6* 7.6* 7.6* 7.6* 7.5*  HCT  18.0* 25.3* 24.8* 24.2* 24.2* 24.3* 23.8*  MCV 89.1 87.5 90.8  --  89.6 89.3 89.5  PLT 252 181 148*  --  159 141* 126*    Basic Metabolic Panel: Recent Labs  Lab 06/27/23 1105 06/28/23 0500 06/29/23 0423 06/30/23 0349 07/01/23 0356  NA 134* 134* 136 137 134*  K 4.4 4.1 3.9 4.0 3.9  CL 105 104 106 105 106  CO2 20* 22 19* 21* 18*  GLUCOSE 162* 113* 117* 108* 106*  BUN 32* 33* 39* 41* 39*  CREATININE 2.59* 2.69* 3.01* 3.25* 3.80*  CALCIUM 7.9* 8.0* 8.2* 7.8* 7.8*  MG  --  2.4 2.2 2.2 2.0  PHOS  --  4.7* 4.8* 5.1* 5.5*    GFR: Estimated Creatinine Clearance: 21.6 mL/min (Pheng Prokop) (by C-G formula based on SCr of 3.8 mg/dL (H)).  Liver Function Tests: Recent Labs  Lab 06/26/23 2100 06/27/23 1105 06/28/23 0500 06/29/23 0423 06/30/23 0349  AST 32 29  30 57* 48*  ALT 27 29 37 59* 59*  ALKPHOS 101 100 105 159* 153*  BILITOT 0.8 1.2 0.7 0.5 0.9  PROT 7.8 7.4 7.4 7.3 7.2  ALBUMIN 2.8* 2.5* 2.4* 2.4* 2.3*    CBG: No results for input(s): "GLUCAP" in the last 168 hours.   Recent Results (from the past 240 hours)  Resp panel by RT-PCR (RSV, Flu Selmer Adduci&B, Covid)     Status: None   Collection Time: 06/26/23  9:00 PM   Specimen: Nasal Swab  Result Value Ref Range Status   SARS Coronavirus 2 by RT PCR NEGATIVE NEGATIVE Final    Comment: (NOTE) SARS-CoV-2 target nucleic acids are NOT DETECTED.  The SARS-CoV-2 RNA is generally detectable in upper respiratory specimens during the acute phase of infection. The lowest concentration of SARS-CoV-2 viral copies this assay can detect is 138 copies/mL. Kylieann Eagles negative result does not preclude SARS-Cov-2 infection and should not be used as the sole basis for treatment or other patient management decisions. Cejay Cambre negative result may occur with  improper specimen collection/handling, submission of specimen other than nasopharyngeal swab, presence of viral mutation(s) within the areas targeted by this assay, and inadequate number of viral copies(<138  copies/mL). Jolena Kittle negative result must be combined with clinical observations, patient history, and epidemiological information. The expected result is Negative.  Fact Sheet for Patients:  BloggerCourse.com  Fact Sheet for Healthcare Providers:  SeriousBroker.it  This test is no t yet approved or cleared by the [place] FDA and  has been authorized for detection and/or diagnosis of SARS-CoV-2 by FDA under an Emergency Use Authorization (EUA). This EUA will remain  in effect (meaning this test can be used) for the duration of the COVID-19 declaration under Section 564(b)(1) of the Act, 21 U.S.C.section 360bbb-3(b)(1), unless the authorization is terminated  or revoked sooner.       Influenza Kaiden Dardis by PCR NEGATIVE NEGATIVE Final   Influenza B by PCR NEGATIVE NEGATIVE Final    Comment: (NOTE) The Xpert Xpress SARS-CoV-2/FLU/RSV plus assay is intended as an aid in the diagnosis of influenza from Nasopharyngeal swab specimens and should not be used as Sho Salguero sole basis for treatment. Nasal washings and aspirates are unacceptable for Xpert Xpress SARS-CoV-2/FLU/RSV testing.  Fact Sheet for Patients: BloggerCourse.com  Fact Sheet for Healthcare Providers: SeriousBroker.it  This test is not yet approved or cleared by the [place] FDA and has been authorized for detection and/or diagnosis of SARS-CoV-2 by FDA under an Emergency Use Authorization (EUA). This EUA will remain in effect (meaning this test can be used) for the duration of the COVID-19 declaration under Section 564(b)(1) of the Act, 21 U.S.C. section 360bbb-3(b)(1), unless the authorization is terminated or revoked.     Resp Syncytial Virus by PCR NEGATIVE NEGATIVE Final    Comment: (NOTE) Fact Sheet for Patients: BloggerCourse.com  Fact Sheet for Healthcare  Providers: SeriousBroker.it  This test is not yet approved or cleared by the [place] FDA and has been authorized for detection and/or diagnosis of SARS-CoV-2 by FDA under an Emergency Use Authorization (EUA). This EUA will remain in effect (meaning this test can be used) for the duration of the COVID-19 declaration under Section 564(b)(1) of the Act, 21 U.S.C. section 360bbb-3(b)(1), unless the authorization is terminated or revoked.  Performed at [facility], [address] [place], [place] [address]   Blood Culture (routine x 2)     Status: Abnormal   Collection Time: 06/26/23  9:00 PM  Specimen: BLOOD  Result Value Ref Range Status   Specimen Description   Final    BLOOD BLOOD RIGHT HAND Performed at [facility], [address] [place], [place] [address]    Special Requests   Final    BOTTLES DRAWN AEROBIC AND ANAEROBIC Blood Culture results may not be optimal due to an inadequate volume of blood received in culture bottles Performed at [facility], [address], [place], [place] [address]    Culture  Setup Time   Final    GRAM NEGATIVE RODS ANAEROBIC BOTTLE ONLY CRITICAL RESULT CALLED TO, READ BACK BY AND VERIFIED WITH: Roni Bread [id number] @ 1549 FH Performed at [facility], [address], [place], [place] [address]    Culture KLEBSIELLA OXYTOCA (Webster Patrone)  Final   Report Status 06/29/2023 FINAL  Final   Organism ID, Bacteria KLEBSIELLA OXYTOCA  Final      Susceptibility   Klebsiella oxytoca - MIC*    AMPICILLIN RESISTANT Resistant     CEFEPIME <=0.12 SENSITIVE Sensitive     CEFTAZIDIME <=1 SENSITIVE Sensitive     CEFTRIAXONE <=0.25 SENSITIVE Sensitive     CIPROFLOXACIN <=0.25 SENSITIVE Sensitive     GENTAMICIN <=1 SENSITIVE Sensitive     IMIPENEM 0.5 SENSITIVE Sensitive     TRIMETH/SULFA <=20 SENSITIVE Sensitive     AMPICILLIN/SULBACTAM 4 SENSITIVE Sensitive      PIP/TAZO <=4 SENSITIVE Sensitive ug/mL    * KLEBSIELLA OXYTOCA  Blood Culture (routine x 2)     Status: None   Collection Time: 06/26/23  9:00 PM   Specimen: BLOOD LEFT HAND  Result Value Ref Range Status   Specimen Description   Final    BLOOD LEFT HAND Performed at [facility], [address], [place], [place] [address]    Special Requests   Final    BOTTLES DRAWN AEROBIC AND ANAEROBIC Blood Culture results may not be optimal due to an inadequate volume of blood received in culture bottles Performed at [facility], [address] [place], [place] [address]    Culture   Final    NO GROWTH 5 DAYS Performed at [facility], [address] [place], [place] [address]    Report Status 07/01/2023 FINAL  Final  Blood Culture ID Panel (Reflexed)     Status: Abnormal   Collection Time: 06/26/23  9:00 PM  Result Value Ref Range Status   Enterococcus faecalis NOT DETECTED NOT DETECTED Final   Enterococcus Faecium NOT DETECTED NOT DETECTED Final   Listeria monocytogenes NOT DETECTED NOT DETECTED Final   Staphylococcus species NOT DETECTED NOT DETECTED Final   Staphylococcus aureus (BCID) NOT DETECTED NOT DETECTED Final   Staphylococcus epidermidis NOT DETECTED NOT DETECTED Final   Staphylococcus lugdunensis NOT DETECTED NOT DETECTED Final   Streptococcus species NOT DETECTED NOT DETECTED Final   Streptococcus agalactiae NOT DETECTED NOT DETECTED Final   Streptococcus pneumoniae NOT DETECTED NOT DETECTED Final   Streptococcus pyogenes NOT DETECTED NOT DETECTED Final   Mariacristina Aday.calcoaceticus-baumannii NOT DETECTED NOT DETECTED Final   Bacteroides fragilis NOT DETECTED NOT DETECTED Final   Enterobacterales DETECTED (Jaxiel Kines) NOT DETECTED Final    Comment: Enterobacterales represent Porshe Fleagle large order of gram negative bacteria, not [place] Lusty single organism. CRITICAL RESULT CALLED TO, READ BACK BY AND VERIFIED WITH: PHARMD D. WOFFORD [id number] @ 1549 FH    Enterobacter cloacae complex  NOT DETECTED NOT DETECTED Final   Escherichia coli NOT DETECTED NOT  DETECTED Final   Klebsiella aerogenes NOT DETECTED NOT DETECTED Final   Klebsiella oxytoca DETECTED (Saul Dorsi) NOT DETECTED Final    Comment: CRITICAL RESULT CALLED TO, READ BACK BY AND VERIFIED WITH: PHARMD D. [id number] @ 1549 FH    Klebsiella pneumoniae NOT DETECTED NOT DETECTED Final   Proteus species NOT DETECTED NOT DETECTED Final   Salmonella species NOT DETECTED NOT DETECTED Final   Serratia marcescens NOT DETECTED NOT DETECTED Final   Haemophilus influenzae NOT DETECTED NOT DETECTED Final   Neisseria meningitidis NOT DETECTED NOT DETECTED Final   Pseudomonas aeruginosa NOT DETECTED NOT DETECTED Final   Stenotrophomonas maltophilia NOT DETECTED NOT DETECTED Final   Candida albicans NOT DETECTED NOT DETECTED Final   Candida auris NOT DETECTED NOT DETECTED Final   Candida glabrata NOT DETECTED NOT DETECTED Final   Candida krusei NOT DETECTED NOT DETECTED Final   Candida parapsilosis NOT DETECTED NOT DETECTED Final   Candida tropicalis NOT DETECTED NOT DETECTED Final   Cryptococcus neoformans/gattii NOT DETECTED NOT DETECTED Final   CTX-M ESBL NOT DETECTED NOT DETECTED Final   Carbapenem resistance IMP NOT DETECTED NOT DETECTED Final   Carbapenem resistance KPC NOT DETECTED NOT DETECTED Final   Carbapenem resistance NDM NOT DETECTED NOT DETECTED Final   Carbapenem resist OXA 48 LIKE NOT DETECTED NOT DETECTED Final   Carbapenem resistance VIM NOT DETECTED NOT DETECTED Final    Comment: Performed at [facility], [address] [place], [place] [address]  Urine Culture     Status: Abnormal   Collection Time: 06/27/23 12:24 AM   Specimen: Urine, Random  Result Value Ref Range Status   Specimen Description   Final    URINE, RANDOM Performed at [facility], [address] [place], [place] [address]    Special Requests   Final    NONE Reflexed from [id number] Performed at [facility], [address] [place], [place] [address]    Culture MULTIPLE SPECIES PRESENT, SUGGEST RECOLLECTION (Senetra Dillin)  Final   Report Status 06/28/2023 FINAL  Final  Respiratory (~20 pathogens) panel by PCR     Status: Abnormal   Collection Time: 06/27/23  6:28 AM   Specimen: Nasopharyngeal Swab; Respiratory  Result Value Ref Range Status   Adenovirus NOT DETECTED NOT DETECTED Final   Coronavirus 229E NOT DETECTED NOT DETECTED Final    Comment: (NOTE) The Coronavirus on the Respiratory Panel, DOES NOT test for the novel  Coronavirus (2019 nCoV)    Coronavirus HKU1 NOT DETECTED NOT DETECTED Final   Coronavirus NL63 NOT DETECTED NOT DETECTED Final   Coronavirus OC43 NOT DETECTED NOT DETECTED Final   Metapneumovirus NOT DETECTED NOT DETECTED Final   Rhinovirus / Enterovirus DETECTED (Anahita Cua) NOT DETECTED Final   Influenza Devonta Blanford NOT DETECTED NOT DETECTED Final   Influenza B NOT DETECTED NOT DETECTED Final   Parainfluenza Virus 1 NOT DETECTED NOT DETECTED Final   Parainfluenza Virus 2 NOT DETECTED NOT DETECTED Final   Parainfluenza Virus 3 NOT DETECTED NOT DETECTED Final   Parainfluenza Virus 4 NOT DETECTED NOT DETECTED Final   Respiratory Syncytial Virus NOT DETECTED NOT DETECTED Final   Bordetella pertussis NOT DETECTED NOT DETECTED Final   Bordetella Parapertussis NOT DETECTED NOT DETECTED Final   Chlamydophila pneumoniae NOT DETECTED NOT DETECTED Final   Mycoplasma pneumoniae NOT DETECTED NOT DETECTED Final    Comment: Performed at [facility], [address] [place], [place] [address]  Radiology Studies: No results found.       Scheduled Meds:  sodium chloride   Intravenous Once   amLODipine  5 mg Oral Daily   amoxicillin-clavulanate  1 tablet Oral BID   carvedilol  3.125 mg Oral BID WC   Chlorhexidine Gluconate Cloth  6 each Topical Daily   pantoprazole  40 mg Oral Daily   potassium chloride SA  20 mEq Oral Daily   rosuvastatin  20 mg Oral QHS    sodium chloride flush  10-40 mL Intracatheter Q12H   sodium chloride flush  3 mL Intravenous Q12H   venlafaxine XR  150 mg Oral Q breakfast   Continuous Infusions:  lactated ringers 100 mL/hr at 07/01/23 0756     LOS: 4 days    Time spent: over 30 min    Lacretia Nicks, MD [facility]   To contact the attending provider between 7A-7P or the covering provider during after hours 7P-7A, please log into the web site [email or web address] and access using universal [employer] password for that web site. If you do not have the password, please call the hospital operator.  07/01/2023, 3:21 PM

## 2023-07-01 NOTE — Progress Notes (Signed)
   07/01/23 2158  BiPAP/CPAP/SIPAP  $ Non-Invasive Home Ventilator  Subsequent  BiPAP/CPAP/SIPAP Pt Type Adult  BiPAP/CPAP/SIPAP DREAMSTATIOND  Mask Type Full face mask  Mask Size Medium  Respiratory Rate 20 breaths/min  FiO2 (%) 21 %  Patient Home Equipment No  Auto Titrate Yes (5-20 cmH2O)

## 2023-07-01 NOTE — Consult Note (Signed)
 Chief Complaint: Patient was seen in consultation today for bilateral hydronephrosis, worsening renal function  Chief Complaint  Patient presents with   Fever   at the request of Lowell Guitar, Caldwell/ Jettie Pagan   Referring Physician(s): Lowell Guitar, Caldwell/ Jettie Pagan   Supervising Physician: Gilmer Mor  Patient Status: [facility] - In-pt  History of Present Illness: Luis [name] is a 68 y.o. male with PMHs of CAD, MI, ascending aortic aneurysm, aortic stenosis, s/p bilateral carotid stent placement, bladder CA s/p radical cystoprostatectomy with ileal conduit November 2024 who presents with bilateral hydronephrosis and worsening renal function, IR was consulted for bilateral PCN placement.   Patient is known to IR for port placement on 05/29/23.   Patient was diagnosed with bladder CA in 2020, he underwent radical cystoprostatectomy, ileal conduit and bilateral ureteral stent placement and umbilical hernia repair November 2024, currently receiving radiation therapy. Patient was admitted due to septic shock in December 2024, per urology note the bilateral ureteral stent was removed at the urology office after discharge.    Patient presented to ED on 3/3 due to fever, admitted due to SIRS and bacteremia, acute on chronic anemia required blood transfusion, and renal dysfunction. CT showed mild bilateral hydroureteronephrosis without obstructing calculus. Patient underwent EGD on 3/5 which showed non bleeding gastric ulcer. Patient's renal function has been declining, urology was consulted and bilateral PCN placement was recommended to the patient. IR was consulted, case reviewed and approved by Dr. Loreta Ave. Patient's hgb has been stable in mid 7s, Dr. Loreta Ave recommended hgb to ve over 8 if possible. Discussed with Dr. Lowell Guitar and patient will received blood transfusion.   Patient laying in bed, not in acute distress. Spouse at bedside.  Reports chronic pain around the belly button.  Denise  headache, fever, chills, shortness of breath, cough, chest pain,  nausea ,vomiting, and bleeding.  Benefit and risks of bilateral PCN placement was discussed in detail, patient was informed that he will not be able to receive moderate sedation till 3 pm due to having breakfast at 9. Patient was informed that the procedure can be performed with half sedation (Versed or Fentanyl) with local anesthetic, after thorough discussion he declines to proceed with half sedation. Patient was informed that the plan is to proceed after 3 pm today but the procedure can be delayed due to emergent cases, then it will be performed tomorrow. Patient and his spouse verbalized understanding.    Past Medical History:  Diagnosis Date   Anxiety    takes Valium as needed   Aortic stenosis, moderate    Arthritis    Ascending aortic aneurysm (HCC)    CAD (coronary artery disease)    a. s/p PCI of the RCA 8/12 with DES by Dr Excell Seltzer, preserved EF. b. LHC/RHC (2/16) with mean RA 12, PA 32/15, mean PCWP 18, CI 3.47; patent mid and distal RCA stents, 50-60% proximal stenosis small PDA.      Cancer [facility])    bladder   Carotid stenosis    a. Carotid US (05/2013):  Bilateral 1-39% ICA; L thyroid nodule (prior hx of aspiration).   Chronic diastolic CHF (congestive heart failure) (HCC)    Complication of anesthesia    difficulty waking up after gallbladder surgery   Depression    Dyspnea    Essential hypertension    GERD (gastroesophageal reflux disease)    if needed will take OTC meds    Heart murmur    History of colonic polyps    hyperplastic  Hyperlipidemia    Joint pain    Lesion of bladder    Myocardial infarction (HCC) 2012   Obesity (BMI 30-39.9) 02/29/2016   Pre-diabetes    Restless leg    Sleep apnea    uses cpap   Tubular adenoma of colon    Vertigo    takes Meclizine as needed    Past Surgical History:  Procedure Laterality Date   AORTIC VALVE REPLACEMENT N/A 09/16/2021   Procedure: AORTIC VALVE  REPLACEMENT (AVR);  Surgeon: Alleen Borne, MD;  Location: [facility] OR;  Service: Open Heart Surgery;  Laterality: N/A;   CATARACT EXTRACTION   4 YRS AGO   BOTH EYES   CHOLECYSTECTOMY  07/21/2011   Procedure: LAPAROSCOPIC CHOLECYSTECTOMY WITH INTRAOPERATIVE CHOLANGIOGRAM;  Surgeon: Kandis Cocking, MD;  Location: [facility] ORS;  Service: General;  Laterality: N/A;   CORONARY ANGIOPLASTY  2012   2 stents   coronary stenting     s/p PCI of the RCA by Dr Excell Seltzer 8/12 with 2 promus stents   CYSTOSCOPY W/ RETROGRADES Bilateral 12/13/2019   Procedure: CYSTOSCOPY WITH RETROGRADE PYELOGRAM;  Surgeon: Sebastian Ache, MD;  Location[facility];  Service: Urology;  Laterality: Bilateral;   CYSTOSCOPY W/ RETROGRADES Bilateral 10/14/2020   Procedure: CYSTOSCOPY WITH RETROGRADE PYELOGRAM;  Surgeon: Sebastian Ache, MD;  Location: [facility];  Service: Urology;  Laterality: Bilateral;   CYSTOSCOPY W/ RETROGRADES Bilateral 12/16/2020   Procedure: CYSTOSCOPY WITH RETROGRADE PYELOGRAM;  Surgeon: Sebastian Ache, MD;  Location: [facility];  Service: Urology;  Laterality: Bilateral;   CYSTOSCOPY W/ RETROGRADES Bilateral 06/03/2022   Procedure: CYSTOSCOPY WITH RETROGRADE PYELOGRAM;  Surgeon: Sebastian Ache, MD;  Location: [facility];  Service: Urology;  Laterality: Bilateral;   CYSTOSCOPY W/ RETROGRADES Bilateral 12/28/2022   Procedure: CYSTOSCOPY WITH RETROGRADE PYELOGRAM, FULGARATION OF BLEEDERS;  Surgeon: Loletta Parish., MD;  Location: [facility];  Service: Urology;  Laterality: Bilateral;   CYSTOSCOPY WITH INJECTION N/A 03/03/2023   Procedure: CYSTOSCOPY WITH INDOCYANINE INJECTION;  Surgeon: Loletta Parish., MD;  Location: [facility];  Service: Urology;  Laterality: N/A;  360 MINUTES   IR IMAGING GUIDED PORT INSERTION  05/29/2023   LEFT AND RIGHT HEART CATHETERIZATION WITH CORONARY ANGIOGRAM N/A 06/23/2014   Procedure: LEFT AND RIGHT HEART CATHETERIZATION WITH CORONARY ANGIOGRAM;   Surgeon: Laurey Morale, MD;  Location: [facility] CATH LAB;  Service: Cardiovascular;  Laterality: N/A;   NECK SURGERY  03/23/09   per Dr. Ophelia Charter, cervical fusion    PERICARDIOCENTESIS N/A 09/28/2021   Procedure: PERICARDIOCENTESIS;  Surgeon: Corky Crafts, MD;  Location: [facility] INVASIVE CV LAB;  Service: Cardiovascular;  Laterality: N/A;   REPLACEMENT ASCENDING AORTA N/A 09/16/2021   Procedure: REPLACEMENT ASCENDING AORTA WITH 30 X HEMASHIELD PLATINUM WOVEN DOUBLE VELOUR VASCULAR GRAFT;  Surgeon: Alleen Borne, MD;  Location: [facility] OR;  Service: Open Heart Surgery;  Laterality: N/A;  CIRC ARREST   right elbow surgery     RIGHT HEART CATH N/A 06/15/2023   Procedure: RIGHT HEART CATH;  Surgeon: Laurey Morale, MD;  Location: [facility] INVASIVE CV LAB;  Service: Cardiovascular;  Laterality: N/A;   RIGHT HEART CATH AND CORONARY ANGIOGRAPHY N/A 07/08/2021   Procedure: RIGHT HEART CATH AND CORONARY ANGIOGRAPHY;  Surgeon: Laurey Morale, MD;  Location: [facility] INVASIVE CV LAB;  Service: Cardiovascular;  Laterality: N/A;   ROBOT ASSISTED LAPAROSCOPIC COMPLETE CYSTECT ILEAL CONDUIT N/A 03/03/2023   Procedure: XI ROBOTIC ASSISTED LAPAROSCOPIC COMPLETE CYSTECTECTOMY WITH  ILEAL  CONDUIT DIVERSION;  Surgeon: Loletta Parish., MD;  Location: [facility];  Service: Urology;  Laterality: N/A;   ROBOT ASSISTED LAPAROSCOPIC RADICAL PROSTATECTOMY N/A 03/03/2023   Procedure: XI ROBOTIC ASSISTED LAPAROSCOPIC RADICAL PROSTATECTOMY WITH LYMPH NODE DISSECTION;  Surgeon: Loletta Parish., MD;  Location: [facility];  Service: Urology;  Laterality: N/A;   solonscopy  05/23/08   per Dr. Celene Kras hemorrhoids only, repeat in 5 years   SUBXYPHOID PERICARDIAL WINDOW N/A 09/28/2021   Procedure: SUBXYPHOID PERICARDIAL WINDOW;  Surgeon: Alleen Borne, MD;  Location: [facility] OR;  Service: Thoracic;  Laterality: N/A;   TEE WITHOUT CARDIOVERSION N/A 06/23/2014   Procedure: TRANSESOPHAGEAL ECHOCARDIOGRAM (TEE);  Surgeon: Laurey Morale, MD;   Location: [facility] ENDOSCOPY;  Service: Cardiovascular;  Laterality: N/A;   TEE WITHOUT CARDIOVERSION N/A 01/21/2016   Procedure: TRANSESOPHAGEAL ECHOCARDIOGRAM (TEE);  Surgeon: Laurey Morale, MD;  Location: [facility] ENDOSCOPY;  Service: Cardiovascular;  Laterality: N/A;   TEE WITHOUT CARDIOVERSION N/A 09/16/2021   Procedure: TRANSESOPHAGEAL ECHOCARDIOGRAM (TEE);  Surgeon: Alleen Borne, MD;  Location: [facility] OR;  Service: Open Heart Surgery;  Laterality: N/A;   TEE WITHOUT CARDIOVERSION N/A 09/28/2021   Procedure: TRANSESOPHAGEAL ECHOCARDIOGRAM (TEE);  Surgeon: Alleen Borne, MD;  Location: [facility] OR;  Service: Thoracic;  Laterality: N/A;   TONSILLECTOMY     TRANSESOPHAGEAL ECHOCARDIOGRAM (CATH LAB) N/A 04/17/2023   Procedure: TRANSESOPHAGEAL ECHOCARDIOGRAM;  Surgeon: Jake Bathe, MD;  Location: [facility] INVASIVE CV LAB;  Service: Cardiovascular;  Laterality: N/A;   TRANSURETHRAL RESECTION OF BLADDER TUMOR N/A 10/21/2019   Procedure: TRANSURETHRAL RESECTION OF BLADDER TUMOR (TURBT);  Surgeon: Ihor Gully, MD;  Location: [facility];  Service: Urology;  Laterality: N/A;   TRANSURETHRAL RESECTION OF BLADDER TUMOR N/A 12/13/2019   Procedure: TRANSURETHRAL RESECTION OF BLADDER TUMOR (TURBT);  Surgeon: Sebastian Ache, MD;  Location: [facility];  Service: Urology;  Laterality: N/A;  1 HR   TRANSURETHRAL RESECTION OF BLADDER TUMOR N/A 10/14/2020   Procedure: TRANSURETHRAL RESECTION OF BLADDER TUMOR (TURBT);  Surgeon: Sebastian Ache, MD;  Location: [facility];  Service: Urology;  Laterality: N/A;   TRANSURETHRAL RESECTION OF BLADDER TUMOR N/A 12/16/2020   Procedure: RESTAGING TRANSURETHRAL RESECTION OF BLADDER TUMOR (TURBT);  Surgeon: Sebastian Ache, MD;  Location: [facility];  Service: Urology;  Laterality: N/A;   TRANSURETHRAL RESECTION OF BLADDER TUMOR N/A 06/03/2022   Procedure: TRANSURETHRAL RESECTION OF BLADDER TUMOR (TURBT);  Surgeon: Sebastian Ache, MD;   Location: [facility];  Service: Urology;  Laterality: N/A;   UMBILICAL HERNIA REPAIR  03/03/2023   Procedure: HERNIA REPAIR UMBILICAL;  Surgeon: Loletta Parish., MD;  Location: [facility];  Service: Urology;;    Allergies: Patient has no known allergies.  Medications: Prior to Admission medications   Medication Sig Start Date End Date Taking? Authorizing Provider  acetaminophen (TYLENOL) 325 MG tablet Take 650 mg by mouth every 6 (six) hours as needed (for pain).   Yes [provider]  amLODipine (NORVASC) 5 MG tablet Take 1 tablet (5 mg total) by mouth daily. 05/16/23  Yes Jaci Standard, MD  aspirin EC 81 MG tablet Take 81 mg by mouth in the morning.   Yes [provider]  carvedilol (COREG) 3.125 MG tablet TAKE 1 TABLET BY MOUTH TWICE A DAY WITH FOOD 05/10/23  Yes Laurey Morale, MD  cyanocobalamin (VITAMIN B12) 1000 MCG tablet Take 1 tablet (1,000 mcg total) by mouth daily. 06/15/23  Yes [name],  Jackquline Denmark IV, MD  diazepam (VALIUM) 5 MG tablet TAKE 1 TABLET BY MOUTH EVERY 12 HOURS AS NEEDED FOR ANXIETY. 03/29/23  Yes Nelwyn Salisbury, MD  furosemide (LASIX) 20 MG tablet Take 1 tablet (20 mg total) by mouth daily. 06/15/23  Yes Laurey Morale, MD  ipratropium (ATROVENT) 0.03 % nasal spray Place 2 sprays into both nostrils every 12 (twelve) hours as needed for rhinitis.   Yes [provider]  lidocaine-prilocaine (EMLA) cream Apply 1 Application topically as needed. 05/16/23  Yes Jaci Standard, MD  Magnesium 500 MG TABS Take 500 mg by mouth in the morning.   Yes [provider]  meclizine (ANTIVERT) 25 MG tablet Take 1 tablet (25 mg total) by mouth 3 (three) times daily as needed for dizziness. 09/09/22  Yes Nelwyn Salisbury, MD  multivitamin-iron-minerals-folic acid (CENTRUM) chewable tablet Chew 1 tablet by mouth daily. 09/23/21  Yes Barrett, Erin R, PA-C  nitroGLYCERIN (NITROSTAT) 0.4 MG SL tablet Place 0.4 mg under the tongue every 5 (five) minutes as needed  for chest pain. 11/03/17 12/05/25 Yes [provider]  ondansetron (ZOFRAN) 8 MG tablet Take 1 tablet (8 mg total) by mouth every 8 (eight) hours as needed. 05/16/23  Yes Jaci Standard, MD  potassium chloride SA (KLOR-CON M20) 20 MEQ tablet Take 1 tablet (20 mEq total) by mouth daily. 05/26/23  Yes Milford, Anderson Malta, FNP  prochlorperazine (COMPAZINE) 10 MG tablet Take 1 tablet (10 mg total) by mouth every 6 (six) hours as needed for nausea or vomiting. 05/16/23  Yes Jaci Standard, MD  rosuvastatin (CRESTOR) 20 MG tablet TAKE 1 TABLET BY MOUTH EVERYDAY AT BEDTIME 02/06/23  Yes Milford, Jessica M, FNP  venlafaxine XR (EFFEXOR-XR) 150 MG 24 hr capsule TAKE 1 CAPSULE BY MOUTH DAILY WITH BREAKFAST. 05/11/23  Yes Nelwyn Salisbury, MD  JARDIANCE 10 MG TABS tablet Take 1 tablet (10 mg total) by mouth daily. Patient not taking: Reported on 06/20/2023 05/10/23   Laurey Morale, MD     Family History  Problem Relation Age of Onset   Lung cancer Mother        lung   Esophageal cancer Cousin    Colon cancer Neg Hx    Rectal cancer Neg Hx    Stomach cancer Neg Hx     Social History   Socioeconomic History   Marital status: Married    Spouse name: Not on file   Number of children: 4   Years of education: Not on file   Highest education level: Not on file  Occupational History   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  Tobacco Use   Smoking status: Former    Current packs/day: 0.00    Average packs/day: 2.0 packs/day for 40.0 years (80.0 ttl pk-yrs)    Types: Cigarettes    Start date: 56    Quit date: 2012    Years since quitting: 13.1    Passive exposure: Never   Smokeless tobacco: Never  Vaping Use   Vaping status: Never Used  Substance and Sexual Activity   Alcohol use: No    Alcohol/week: 0.0 standard drinks of alcohol   Drug use: No   Sexual activity: Not on file  Other Topics Concern   Not on file  Social History Narrative   Not on file    Social Drivers of Health   Financial Resource Strain: Low Risk  (11/03/2022)  Overall Financial Resource Strain (CARDIA)    Difficulty of Paying Living Expenses: Not hard at all  Food Insecurity: No Food Insecurity (06/27/2023)   Hunger Vital Sign    Worried About Running Out of Food in the Last Year: Never true    Ran Out of Food in the Last Year: Never true  Transportation Needs: No Transportation Needs (06/27/2023)   PRAPARE - Administrator, Civil Service (Medical): No    Lack of Transportation (Non-Medical): No  Physical Activity: Unknown (11/03/2022)   Exercise Vital Sign    Days of Exercise per Week: Patient unable to answer    Minutes of Exercise per Session: 30 min  Stress: No Stress Concern Present (11/03/2022)   [employer] of Occupational Health - Occupational Stress Questionnaire    Feeling of Stress : Not at all  Social Connections: Moderately Isolated (06/27/2023)   Social Connection and Isolation Panel [NHANES]    Frequency of Communication with Friends and Family: More than three times a week    Frequency of Social Gatherings with Friends and Family: More than three times a week    Attends Religious Services: Never    Database administrator or Organizations: No    Attends Banker Meetings: Never    Marital Status: Married     Review of Systems: A 12 point ROS discussed and pertinent positives are indicated in the HPI above.  All other systems are negative.  Vital Signs: BP 133/74 (BP Location: Left Arm)   Pulse (!) 57   Temp 97.8 F (36.6 C) (Oral)   Resp 18   Ht 5\' 6"  (1.676 m)   Wt 246 lb 14.6 oz (112 kg)   SpO2 98%   BMI 39.85 kg/m    Physical Exam Vitals reviewed.  Constitutional:      General: He is not in acute distress.    Appearance: He is not ill-appearing.  HENT:     Head: Normocephalic and atraumatic.     Mouth/Throat:     Mouth: Mucous membranes are moist.     Pharynx: Oropharynx is clear.  Cardiovascular:      Rate and Rhythm: Normal rate and regular rhythm.     Heart sounds: Normal heart sounds.  Pulmonary:     Effort: Pulmonary effort is normal.     Breath sounds: Normal breath sounds.  Abdominal:     General: Abdomen is flat.     Palpations: Abdomen is soft.  Musculoskeletal:     Cervical back: Neck supple.  Skin:    General: Skin is warm and dry.     Coloration: Skin is not jaundiced or pale.     Comments: + port in right upper chest   Neurological:     Mental Status: He is alert and oriented to person, place, and time.  Psychiatric:        Mood and Affect: Mood normal.        Behavior: Behavior normal.        Judgment: Judgment normal.     MD Evaluation Airway: WNL Heart: WNL Abdomen: WNL Chest/ Lungs: WNL ASA  Classification: 3 Mallampati/Airway Score: Two  Imaging: CT ABDOMEN PELVIS WO CONTRAST Result Date: 06/27/2023 CLINICAL DATA:  Fever. EXAM: CT ABDOMEN AND PELVIS WITHOUT CONTRAST TECHNIQUE: Multidetector CT imaging of the abdomen and pelvis was performed following the standard protocol without IV contrast. RADIATION DOSE REDUCTION: This exam was performed according to the departmental dose-optimization program which includes automated exposure control,  adjustment of the mA and/or kV according to patient size and/or use of iterative reconstruction technique. COMPARISON:  April 13, 2023. FINDINGS: Lower chest: No acute abnormality. Hepatobiliary: No focal liver abnormality is seen. Status post cholecystectomy. No biliary dilatation. Pancreas: Unremarkable. No pancreatic ductal dilatation or surrounding inflammatory changes. Spleen: Normal in size without focal abnormality. Adrenals/Urinary Tract: Adrenal glands appear normal. Patient is status post cystectomy with ileal conduit placement. Mild bilateral hydroureteronephrosis is noted without obstructing calculus. Stable left renal cysts are noted. Stomach/Bowel: The stomach is unremarkable. There is no evidence of bowel  obstruction or inflammation. Vascular/Lymphatic: Aortic atherosclerosis. No enlarged abdominal or pelvic lymph nodes. Reproductive: Status post prostatectomy. Other: No ascites or hernia is noted. Musculoskeletal: No acute or significant osseous findings. IMPRESSION: Status post cystectomy with ileal conduit placement, as well as prostatectomy. Mild bilateral hydroureteronephrosis is noted without obstructing calculus. Aortic Atherosclerosis (ICD10-I70.0). Electronically Signed   By: Lupita Raider M.D.   On: 06/27/2023 15:28   DG Chest Port 1 View Result Date: 06/27/2023 CLINICAL DATA:  Fevers EXAM: PORTABLE CHEST 1 VIEW COMPARISON:  05/16/2023 CT FINDINGS: Cardiac shadow is within normal limits. Lungs are well aerated bilaterally. Postsurgical changes are seen. Right chest wall port is noted in satisfactory position. No bony abnormality is noted. IMPRESSION: No active disease. Electronically Signed   By: Alcide Clever M.D.   On: 06/27/2023 00:06   CARDIAC CATHETERIZATION Result Date: 06/15/2023 1. Normal PCWP, elevated RA pressure.  R>L heart failure. 2. Normal (high) CO 3. Preserved PAPi Start Lasix 20 mg po daily.    Labs:  CBC: Recent Labs    06/28/23 0500 06/28/23 1940 06/29/23 0423 06/30/23 0349 07/01/23 0356  WBC 6.6  --  5.6 5.3 5.3  HGB 7.6* 7.6* 7.6* 7.6* 7.5*  HCT 24.8* 24.2* 24.2* 24.3* 23.8*  PLT 148*  --  159 141* 126*    COAGS: Recent Labs    04/13/23 1024 06/26/23 2100  INR 1.3* 1.3*  APTT 38* 34    BMP: Recent Labs    06/28/23 0500 06/29/23 0423 06/30/23 0349 07/01/23 0356  NA 134* 136 137 134*  K 4.1 3.9 4.0 3.9  CL 104 106 105 106  CO2 22 19* 21* 18*  GLUCOSE 113* 117* 108* 106*  BUN 33* 39* 41* 39*  CALCIUM 8.0* 8.2* 7.8* 7.8*  CREATININE 2.69* 3.01* 3.25* 3.80*  GFRNONAA 25* 22* 20* 16*    LIVER FUNCTION TESTS: Recent Labs    06/27/23 1105 06/28/23 0500 06/29/23 0423 06/30/23 0349  BILITOT 1.2 0.7 0.5 0.9  AST 29 30 57* 48*  ALT 29 37  59* 59*  ALKPHOS 100 105 159* 153*  PROT 7.4 7.4 7.3 7.2  ALBUMIN 2.5* 2.4* 2.4* 2.3*    TUMOR MARKERS: No results for input(s): "AFPTM", "CEA", "CA199", "CHROMGRNA" in the last 8760 hours.  Assessment and Plan: 68 y.o. male with bladder CA s/p radical cystoprostatectomy with ileal conduit presents with bilateral hydronephrosis and worsening renal function, he is in need of bilateral PCN placement.   VSS CBC with hgb 7.5, plt 126 - patient will receive blood transfusion INR pending  at 1239 hrs  RF worsening creatinine 3.8, BUN 39, GFR 16  Was on ASA 81 mg at home, last dose on 3/3 - Dr. Loreta Ave notified  On Augmentin, 2 g rocephin will be given during the procedure NKDA  Risks and benefits of bilateral  PCN placement was discussed with the patient including, but not limited to, infection, bleeding, significant  bleeding causing loss or decrease in renal function or damage to adjacent structures.   All of the patient's questions were answered, patient is agreeable to proceed.  Consent signed and in chart.  PLAN - the procedure is tentatively scheduled for 3 pm today if INR in appropriate range  - patient to remain NPO except meds     Thank you for this interesting consult.  I greatly enjoyed meeting Luis [name] and look forward to participating in their care.  A copy of this report was sent to the requesting provider on this date.  Electronically Signed: Willette Brace, PA-C 07/01/2023, 12:29 PM   I spent a total of 40 Minutes    in face to face in clinical consultation, greater than 50% of which was counseling/coordinating care for bilateral PCN placement.   This chart was dictated using voice recognition software.  Despite best efforts to proofread,  errors can occur which can change the documentation meaning.

## 2023-07-01 NOTE — Consult Note (Addendum)
 Consultation: Bilateral hydronephrosis, rising creatinine Requested by: Dr. Lacretia Nicks  History of Present Illness: Luis [name] is a 68 year old male who underwent radical cystoprostatectomy November 2024.  He had node positive disease and is seeing Dr. [name].  He had 1 gem/cis cycle but that was held due to rising creatinine.  He also saw Dr. Kathrynn Running and began bladder fossa and pelvic radiation.  He saw Dr. [name] December 2024 and had his last ureteral stent removed in the office.  He had a urinary tract infection and was admitted in December.  He was admitted 06/26/2023 with malaise and fever.  Blood cultures positive for Klebsiella. He underwent a 06/27/2023 CT scan of the abdomen and pelvis which revealed bilateral hydroureteronephrosis down to the ileal conduit with mild distention of the conduit to the fascia.  No lymphadenopathy or bone lesions.  Since his surgery and with stent removal his creatinine has increased.  His baseline was 0.9-1.3 range.  His creatinine went up to 2.4 in December 2024 but then back down to 0.95.  In February creatinine increased to 2.4 and then declined to 2.05.  In March his creatinine has gone from 2.7 --> 2.3 --> 2.7 --> 3.01.    Past Medical History:  Diagnosis Date   Anxiety    takes Valium as needed   Aortic stenosis, moderate    Arthritis    Ascending aortic aneurysm (HCC)    CAD (coronary artery disease)    a. s/p PCI of the RCA 8/12 with DES by Dr Excell Seltzer, preserved EF. b. LHC/RHC (2/16) with mean RA 12, PA 32/15, mean PCWP 18, CI 3.47; patent mid and distal RCA stents, 50-60% proximal stenosis small PDA.      Cancer [facility])    bladder   Carotid stenosis    a. Carotid US (05/2013):  Bilateral 1-39% ICA; L thyroid nodule (prior hx of aspiration).   Chronic diastolic CHF (congestive heart failure) (HCC)    Complication of anesthesia    difficulty waking up after gallbladder surgery   Depression    Dyspnea    Essential hypertension    GERD (gastroesophageal  reflux disease)    if needed will take OTC meds    Heart murmur    History of colonic polyps    hyperplastic   Hyperlipidemia    Joint pain    Lesion of bladder    Myocardial infarction (HCC) 2012   Obesity (BMI 30-39.9) 02/29/2016   Pre-diabetes    Restless leg    Sleep apnea    uses cpap   Tubular adenoma of colon    Vertigo    takes Meclizine as needed   Past Surgical History:  Procedure Laterality Date   AORTIC VALVE REPLACEMENT N/A 09/16/2021   Procedure: AORTIC VALVE REPLACEMENT (AVR);  Surgeon: Alleen Borne, MD;  Location: [facility] OR;  Service: Open Heart Surgery;  Laterality: N/A;   CATARACT EXTRACTION   4 YRS AGO   BOTH EYES   CHOLECYSTECTOMY  07/21/2011   Procedure: LAPAROSCOPIC CHOLECYSTECTOMY WITH INTRAOPERATIVE CHOLANGIOGRAM;  Surgeon: Kandis Cocking, MD;  Location: [facility] ORS;  Service: General;  Laterality: N/A;   CORONARY ANGIOPLASTY  2012   2 stents   coronary stenting     s/p PCI of the RCA by Dr Excell Seltzer 8/12 with 2 promus stents   CYSTOSCOPY W/ RETROGRADES Bilateral 12/13/2019   Procedure: CYSTOSCOPY WITH RETROGRADE PYELOGRAM;  Surgeon: Sebastian Ache, MD;  Location: [facility];  Service: Urology;  Laterality: Bilateral;   CYSTOSCOPY  W/ RETROGRADES Bilateral 10/14/2020   Procedure: CYSTOSCOPY WITH RETROGRADE PYELOGRAM;  Surgeon: Sebastian Ache, MD;  Location: [facility];  Service: Urology;  Laterality: Bilateral;   CYSTOSCOPY W/ RETROGRADES Bilateral 12/16/2020   Procedure: CYSTOSCOPY WITH RETROGRADE PYELOGRAM;  Surgeon: Sebastian Ache, MD;  Location: [facility];  Service: Urology;  Laterality: Bilateral;   CYSTOSCOPY W/ RETROGRADES Bilateral 06/03/2022   Procedure: CYSTOSCOPY WITH RETROGRADE PYELOGRAM;  Surgeon: Sebastian Ache, MD;  Location: [facility];  Service: Urology;  Laterality: Bilateral;   CYSTOSCOPY W/ RETROGRADES Bilateral 12/28/2022   Procedure: CYSTOSCOPY WITH RETROGRADE PYELOGRAM, FULGARATION OF BLEEDERS;   Surgeon: Loletta Parish., MD;  Location: [facility];  Service: Urology;  Laterality: Bilateral;   CYSTOSCOPY WITH INJECTION N/A 03/03/2023   Procedure: CYSTOSCOPY WITH INDOCYANINE INJECTION;  Surgeon: Loletta Parish., MD;  Location: [facility];  Service: Urology;  Laterality: N/A;  360 MINUTES   IR IMAGING GUIDED PORT INSERTION  05/29/2023   LEFT AND RIGHT HEART CATHETERIZATION WITH CORONARY ANGIOGRAM N/A 06/23/2014   Procedure: LEFT AND RIGHT HEART CATHETERIZATION WITH CORONARY ANGIOGRAM;  Surgeon: Laurey Morale, MD;  Location: [facility] CATH LAB;  Service: Cardiovascular;  Laterality: N/A;   NECK SURGERY  03/23/09   per Dr. Ophelia Charter, cervical fusion    PERICARDIOCENTESIS N/A 09/28/2021   Procedure: PERICARDIOCENTESIS;  Surgeon: Corky Crafts, MD;  Location: [facility] INVASIVE CV LAB;  Service: Cardiovascular;  Laterality: N/A;   REPLACEMENT ASCENDING AORTA N/A 09/16/2021   Procedure: REPLACEMENT ASCENDING AORTA WITH 30 X HEMASHIELD PLATINUM WOVEN DOUBLE VELOUR VASCULAR GRAFT;  Surgeon: Alleen Borne, MD;  Location: [facility] OR;  Service: Open Heart Surgery;  Laterality: N/A;  CIRC ARREST   right elbow surgery     RIGHT HEART CATH N/A 06/15/2023   Procedure: RIGHT HEART CATH;  Surgeon: Laurey Morale, MD;  Location: [facility] INVASIVE CV LAB;  Service: Cardiovascular;  Laterality: N/A;   RIGHT HEART CATH AND CORONARY ANGIOGRAPHY N/A 07/08/2021   Procedure: RIGHT HEART CATH AND CORONARY ANGIOGRAPHY;  Surgeon: Laurey Morale, MD;  Location: [facility] INVASIVE CV LAB;  Service: Cardiovascular;  Laterality: N/A;   ROBOT ASSISTED LAPAROSCOPIC COMPLETE CYSTECT ILEAL CONDUIT N/A 03/03/2023   Procedure: XI ROBOTIC ASSISTED LAPAROSCOPIC COMPLETE CYSTECTECTOMY WITH  ILEAL CONDUIT DIVERSION;  Surgeon: Loletta Parish., MD;  Location: [facility];  Service: Urology;  Laterality: N/A;   ROBOT ASSISTED LAPAROSCOPIC RADICAL PROSTATECTOMY N/A 03/03/2023   Procedure: XI ROBOTIC ASSISTED LAPAROSCOPIC RADICAL PROSTATECTOMY WITH LYMPH  NODE DISSECTION;  Surgeon: Loletta Parish., MD;  Location: [facility];  Service: Urology;  Laterality: N/A;   solonscopy  05/23/08   per Dr. Celene Kras hemorrhoids only, repeat in 5 years   SUBXYPHOID PERICARDIAL WINDOW N/A 09/28/2021   Procedure: SUBXYPHOID PERICARDIAL WINDOW;  Surgeon: Alleen Borne, MD;  Location: [facility] OR;  Service: Thoracic;  Laterality: N/A;   TEE WITHOUT CARDIOVERSION N/A 06/23/2014   Procedure: TRANSESOPHAGEAL ECHOCARDIOGRAM (TEE);  Surgeon: Laurey Morale, MD;  Location: [facility] ENDOSCOPY;  Service: Cardiovascular;  Laterality: N/A;   TEE WITHOUT CARDIOVERSION N/A 01/21/2016   Procedure: TRANSESOPHAGEAL ECHOCARDIOGRAM (TEE);  Surgeon: Laurey Morale, MD;  Location: [facility] ENDOSCOPY;  Service: Cardiovascular;  Laterality: N/A;   TEE WITHOUT CARDIOVERSION N/A 09/16/2021   Procedure: TRANSESOPHAGEAL ECHOCARDIOGRAM (TEE);  Surgeon: Alleen Borne, MD;  Location: [facility] OR;  Service: Open Heart Surgery;  Laterality: N/A;   TEE WITHOUT CARDIOVERSION N/A 09/28/2021   Procedure: TRANSESOPHAGEAL ECHOCARDIOGRAM (TEE);  Surgeon: Evelene Croon  K, MD;  Location: [facility] OR;  Service: Thoracic;  Laterality: N/A;   TONSILLECTOMY     TRANSESOPHAGEAL ECHOCARDIOGRAM (CATH LAB) N/A 04/17/2023   Procedure: TRANSESOPHAGEAL ECHOCARDIOGRAM;  Surgeon: Jake Bathe, MD;  Location: [facility] INVASIVE CV LAB;  Service: Cardiovascular;  Laterality: N/A;   TRANSURETHRAL RESECTION OF BLADDER TUMOR N/A 10/21/2019   Procedure: TRANSURETHRAL RESECTION OF BLADDER TUMOR (TURBT);  Surgeon: Ihor Gully, MD;  Location: [facility];  Service: Urology;  Laterality: N/A;   TRANSURETHRAL RESECTION OF BLADDER TUMOR N/A 12/13/2019   Procedure: TRANSURETHRAL RESECTION OF BLADDER TUMOR (TURBT);  Surgeon: Sebastian Ache, MD;  Location: [facility];  Service: Urology;  Laterality: N/A;  1 HR   TRANSURETHRAL RESECTION OF BLADDER TUMOR N/A 10/14/2020   Procedure: TRANSURETHRAL RESECTION OF BLADDER TUMOR  (TURBT);  Surgeon: Sebastian Ache, MD;  Location: [facility];  Service: Urology;  Laterality: N/A;   TRANSURETHRAL RESECTION OF BLADDER TUMOR N/A 12/16/2020   Procedure: RESTAGING TRANSURETHRAL RESECTION OF BLADDER TUMOR (TURBT);  Surgeon: Sebastian Ache, MD;  Location: [facility];  Service: Urology;  Laterality: N/A;   TRANSURETHRAL RESECTION OF BLADDER TUMOR N/A 06/03/2022   Procedure: TRANSURETHRAL RESECTION OF BLADDER TUMOR (TURBT);  Surgeon: Sebastian Ache, MD;  Location: [facility];  Service: Urology;  Laterality: N/A;   UMBILICAL HERNIA REPAIR  03/03/2023   Procedure: HERNIA REPAIR UMBILICAL;  Surgeon: Loletta Parish., MD;  Location: [facility];  Service: Urology;;    Home Medications:  Medications Prior to Admission  Medication Sig Dispense Refill Last Dose/Taking   acetaminophen (TYLENOL) 325 MG tablet Take 650 mg by mouth every 6 (six) hours as needed (for pain).   06/26/2023   amLODipine (NORVASC) 5 MG tablet Take 1 tablet (5 mg total) by mouth daily. 90 tablet 1 Past Week   aspirin EC 81 MG tablet Take 81 mg by mouth in the morning.   06/26/2023 Morning   carvedilol (COREG) 3.125 MG tablet TAKE 1 TABLET BY MOUTH TWICE A DAY WITH FOOD 180 tablet 1 06/26/2023   cyanocobalamin (VITAMIN B12) 1000 MCG tablet Take 1 tablet (1,000 mcg total) by mouth daily. 30 tablet 6 06/26/2023   diazepam (VALIUM) 5 MG tablet TAKE 1 TABLET BY MOUTH EVERY 12 HOURS AS NEEDED FOR ANXIETY. 60 tablet 5 Taking As Needed   furosemide (LASIX) 20 MG tablet Take 1 tablet (20 mg total) by mouth daily. 45 tablet 3 06/26/2023   ipratropium (ATROVENT) 0.03 % nasal spray Place 2 sprays into both nostrils every 12 (twelve) hours as needed for rhinitis.   Taking As Needed   lidocaine-prilocaine (EMLA) cream Apply 1 Application topically as needed. 30 g 0 Taking As Needed   Magnesium 500 MG TABS Take 500 mg by mouth in the morning.   06/26/2023   meclizine (ANTIVERT) 25 MG tablet Take 1 tablet (25 mg  total) by mouth 3 (three) times daily as needed for dizziness. 30 tablet 0 Taking As Needed   multivitamin-iron-minerals-folic acid (CENTRUM) chewable tablet Chew 1 tablet by mouth daily.   06/26/2023   nitroGLYCERIN (NITROSTAT) 0.4 MG SL tablet Place 0.4 mg under the tongue every 5 (five) minutes as needed for chest pain.   Taking As Needed   ondansetron (ZOFRAN) 8 MG tablet Take 1 tablet (8 mg total) by mouth every 8 (eight) hours as needed. 30 tablet 0 Taking As Needed   potassium chloride SA (KLOR-CON M20) 20 MEQ tablet Take 1 tablet (20 mEq total) by mouth  daily.   06/26/2023 Morning   prochlorperazine (COMPAZINE) 10 MG tablet Take 1 tablet (10 mg total) by mouth every 6 (six) hours as needed for nausea or vomiting. 30 tablet 0 Taking As Needed   rosuvastatin (CRESTOR) 20 MG tablet TAKE 1 TABLET BY MOUTH EVERYDAY AT BEDTIME 90 tablet 3 06/26/2023   venlafaxine XR (EFFEXOR-XR) 150 MG 24 hr capsule TAKE 1 CAPSULE BY MOUTH DAILY WITH BREAKFAST. 90 capsule 0 06/26/2023 Morning   JARDIANCE 10 MG TABS tablet Take 1 tablet (10 mg total) by mouth daily. (Patient not taking: Reported on 06/20/2023) 30 tablet 11    Allergies: No Known Allergies  Family History  Problem Relation Age of Onset   Lung cancer Mother        lung   Esophageal cancer Cousin    Colon cancer Neg Hx    Rectal cancer Neg Hx    Stomach cancer Neg Hx    Social History:  reports that he quit smoking about 13 years ago. His smoking use included cigarettes. He started smoking about 53 years ago. He has a 80 pack-year smoking history. He has never been exposed to tobacco smoke. He has never used smokeless tobacco. He reports that he does not drink alcohol and does not use drugs.  ROS: A complete review of systems was performed.  All systems are negative except for pertinent findings as noted. Review of Systems  All other systems reviewed and are negative.    Physical Exam:  Vital signs in last 24 hours: Temp:  [97.8 F (36.6  C)-98.7 F (37.1 C)] 97.8 F (36.6 C) (03/08 0401) Pulse Rate:  [54-57] 57 (03/08 0401) Resp:  [18-20] 18 (03/08 0401) BP: (115-133)/(57-74) 133/74 (03/08 0401) SpO2:  [98 %-100 %] 98 % (03/08 0401) General:  Alert and oriented, No acute distress HEENT: Normocephalic, atraumatic Cardiovascular: Regular rate and rhythm Lungs: Regular rate and effort Abdomen: Soft, nontender, nondistended, no abdominal masses, ileostomy pink and viable.  Foley in ileal conduit - yellow urine present non purulent appearing Back: No CVA tenderness Extremities: No edema Neurologic: Grossly intact  Laboratory Data:  Results for orders placed or performed during the hospital encounter of 06/26/23 (from the past 24 hours)  Basic metabolic panel     Status: Abnormal   Collection Time: 07/01/23  3:56 AM  Result Value Ref Range   Sodium 134 (L) 135 - 145 mmol/L   Potassium 3.9 3.5 - 5.1 mmol/L   Chloride 106 98 - 111 mmol/L   CO2 18 (L) 22 - 32 mmol/L   Glucose, Bld 106 (H) 70 - 99 mg/dL   BUN 39 (H) 8 - 23 mg/dL   Creatinine, Ser 5.64 (H) 0.61 - 1.24 mg/dL   Calcium 7.8 (L) 8.9 - 10.3 mg/dL   GFR, Estimated 16 (L) >60 mL/min   Anion gap 10 5 - 15  CBC     Status: Abnormal   Collection Time: 07/01/23  3:56 AM  Result Value Ref Range   WBC 5.3 4.0 - 10.5 K/uL   RBC 2.66 (L) 4.22 - 5.81 MIL/uL   Hemoglobin 7.5 (L) 13.0 - 17.0 g/dL   HCT 33.2 (L) 95.1 - 88.4 %   MCV 89.5 80.0 - 100.0 fL   MCH 28.2 26.0 - 34.0 pg   MCHC 31.5 30.0 - 36.0 g/dL   RDW 16.6 (H) 06.3 - 01.6 %   Platelets 126 (L) 150 - 400 K/uL   nRBC 0.0 0.0 - 0.2 %  Magnesium  Status: None   Collection Time: 07/01/23  3:56 AM  Result Value Ref Range   Magnesium 2.0 1.7 - 2.4 mg/dL  Phosphorus     Status: Abnormal   Collection Time: 07/01/23  3:56 AM  Result Value Ref Range   Phosphorus 5.5 (H) 2.5 - 4.6 mg/dL    Creatinine: Recent Labs    06/26/23 2054 06/26/23 2100 06/27/23 1105 06/28/23 0500 06/29/23 0423 06/30/23 0349  07/01/23 0356  CREATININE 2.70* 2.26* 2.59* 2.69* 3.01* 3.25* 3.80*    Impression/Assessment:  68 year old status post radical cystoprostatectomy and ileal conduit November 2024 with a rising creatinine since the stents have been removed with a bilateral hydroureteronephrosis and distended ostomy on CT.  The ureters appear widely patent to the conduit and the conduit is full of urine.  Possible obstruction at the fascia.  Metastatic disease being treated with pelvic radiation with chemotherapy on hold due to rising creatinine.  Update:  Pt feeling okay this AM - a bit disappointed he will not likely be able to leave hospital today Remains HDS 475 UOP documented in chart Creatinine continuing to uptrend. 3.8 today.   Plan:  -in setting of rising creatinine that continues to go in wrong direction and bilateral hydronephrosis, recommend nephrostomy tubes bilaterally for maximal urinary decompression to help expedite pt being able to start chemotherapy -NPO order placed, appears pt last ate around 8AM this AM -IR Consult for consideration of bilateral nephrostomy tubes  Zettie Pho 07/01/2023, 9:30 AM    I have seen and examined the patient and agree with the above assessment and plan.  Will plan for bilateral PCNs. Made NPO.   Matt R. Reign Bartnick MD [facility]  Pager: [phone]

## 2023-07-02 ENCOUNTER — Encounter (HOSPITAL_COMMUNITY): Payer: Self-pay | Admitting: Gastroenterology

## 2023-07-02 DIAGNOSIS — R6510 Systemic inflammatory response syndrome (SIRS) of non-infectious origin without acute organ dysfunction: Secondary | ICD-10-CM | POA: Diagnosis not present

## 2023-07-02 LAB — CBC WITH DIFFERENTIAL/PLATELET
Abs Immature Granulocytes: 0.09 10*3/uL — ABNORMAL HIGH (ref 0.00–0.07)
Basophils Absolute: 0 10*3/uL (ref 0.0–0.1)
Basophils Relative: 0 %
Eosinophils Absolute: 0 10*3/uL (ref 0.0–0.5)
Eosinophils Relative: 0 %
HCT: 26.6 % — ABNORMAL LOW (ref 39.0–52.0)
Hemoglobin: 8.3 g/dL — ABNORMAL LOW (ref 13.0–17.0)
Immature Granulocytes: 1 %
Lymphocytes Relative: 4 %
Lymphs Abs: 0.4 10*3/uL — ABNORMAL LOW (ref 0.7–4.0)
MCH: 28.5 pg (ref 26.0–34.0)
MCHC: 31.2 g/dL (ref 30.0–36.0)
MCV: 91.4 fL (ref 80.0–100.0)
Monocytes Absolute: 0.4 10*3/uL (ref 0.1–1.0)
Monocytes Relative: 4 %
Neutro Abs: 8.7 10*3/uL — ABNORMAL HIGH (ref 1.7–7.7)
Neutrophils Relative %: 91 %
Platelets: 121 10*3/uL — ABNORMAL LOW (ref 150–400)
RBC: 2.91 MIL/uL — ABNORMAL LOW (ref 4.22–5.81)
RDW: 15.5 % (ref 11.5–15.5)
WBC: 9.7 10*3/uL (ref 4.0–10.5)
nRBC: 0 % (ref 0.0–0.2)

## 2023-07-02 LAB — COMPREHENSIVE METABOLIC PANEL
ALT: 44 U/L (ref 0–44)
AST: 27 U/L (ref 15–41)
Albumin: 2.4 g/dL — ABNORMAL LOW (ref 3.5–5.0)
Alkaline Phosphatase: 146 U/L — ABNORMAL HIGH (ref 38–126)
Anion gap: 9 (ref 5–15)
BUN: 39 mg/dL — ABNORMAL HIGH (ref 8–23)
CO2: 18 mmol/L — ABNORMAL LOW (ref 22–32)
Calcium: 7.9 mg/dL — ABNORMAL LOW (ref 8.9–10.3)
Chloride: 107 mmol/L (ref 98–111)
Creatinine, Ser: 4.27 mg/dL — ABNORMAL HIGH (ref 0.61–1.24)
GFR, Estimated: 14 mL/min — ABNORMAL LOW (ref >60)
Glucose, Bld: 134 mg/dL — ABNORMAL HIGH (ref 70–99)
Potassium: 4.9 mmol/L (ref 3.5–5.1)
Sodium: 134 mmol/L — ABNORMAL LOW (ref 135–145)
Total Bilirubin: 0.7 mg/dL (ref 0.0–1.2)
Total Protein: 6.9 g/dL (ref 6.5–8.1)

## 2023-07-02 LAB — MAGNESIUM: Magnesium: 1.8 mg/dL (ref 1.7–2.4)

## 2023-07-02 LAB — PHOSPHORUS: Phosphorus: 5.6 mg/dL — ABNORMAL HIGH (ref 2.5–4.6)

## 2023-07-02 MED ORDER — ACETAMINOPHEN 500 MG PO TABS
1000.0000 mg | ORAL_TABLET | Freq: Three times a day (TID) | ORAL | Status: DC
Start: 2023-07-02 — End: 2023-07-05
  Administered 2023-07-02 – 2023-07-03 (×3): 1000 mg via ORAL
  Filled 2023-07-02 (×4): qty 2

## 2023-07-02 MED ORDER — OXYCODONE HCL 5 MG PO TABS: 5.0000 mg | ORAL_TABLET | Freq: Four times a day (QID) | ORAL | Status: DC | PRN

## 2023-07-02 MED ORDER — ACETAMINOPHEN 325 MG PO TABS: 650.0000 mg | ORAL_TABLET | Freq: Four times a day (QID) | ORAL | Status: DC | PRN

## 2023-07-02 MED ORDER — ROSUVASTATIN CALCIUM 10 MG PO TABS
10.0000 mg | ORAL_TABLET | Freq: Every day | ORAL | Status: DC
Start: 2023-07-02 — End: 2023-07-04
  Administered 2023-07-02: 10 mg via ORAL
  Filled 2023-07-02: qty 1

## 2023-07-02 NOTE — Plan of Care (Signed)

## 2023-07-02 NOTE — Consult Note (Addendum)
 Consultation: Bilateral hydronephrosis, rising creatinine Requested by: Dr. Lacretia Nicks  History of Present Illness: Luis [name] is a 68 year old male who underwent radical cystoprostatectomy November 2024.  He had node positive disease and is seeing Dr. [name].  He had 1 gem/cis cycle but that was held due to rising creatinine.  He also saw Dr. Kathrynn Running and began bladder fossa and pelvic radiation.  He saw Dr. [name] December 2024 and had his last ureteral stent removed in the office.  He had a urinary tract infection and was admitted in December.  He was admitted 06/26/2023 with malaise and fever.  Blood cultures positive for Klebsiella. He underwent a 06/27/2023 CT scan of the abdomen and pelvis which revealed bilateral hydroureteronephrosis down to the ileal conduit with mild distention of the conduit to the fascia.  No lymphadenopathy or bone lesions.  Since his surgery and with stent removal his creatinine has increased.  His baseline was 0.9-1.3 range.  His creatinine went up to 2.4 in December 2024 but then back down to 0.95.  In February creatinine increased to 2.4 and then declined to 2.05.  In March his creatinine has gone from 2.7 --> 2.3 --> 2.7 --> 3.01.    Past Medical History:  Diagnosis Date   Anxiety    takes Valium as needed   Aortic stenosis, moderate    Arthritis    Ascending aortic aneurysm (HCC)    CAD (coronary artery disease)    a. s/p PCI of the RCA 8/12 with DES by Dr Excell Seltzer, preserved EF. b. LHC/RHC (2/16) with mean RA 12, PA 32/15, mean PCWP 18, CI 3.47; patent mid and distal RCA stents, 50-60% proximal stenosis small PDA.      Cancer [facility])    bladder   Carotid stenosis    a. Carotid US (05/2013):  Bilateral 1-39% ICA; L thyroid nodule (prior hx of aspiration).   Chronic diastolic CHF (congestive heart failure) (HCC)    Complication of anesthesia    difficulty waking up after gallbladder surgery   Depression    Dyspnea    Essential hypertension    GERD (gastroesophageal  reflux disease)    if needed will take OTC meds    Heart murmur    History of colonic polyps    hyperplastic   Hyperlipidemia    Joint pain    Lesion of bladder    Myocardial infarction (HCC) 2012   Obesity (BMI 30-39.9) 02/29/2016   Pre-diabetes    Restless leg    Sleep apnea    uses cpap   Tubular adenoma of colon    Vertigo    takes Meclizine as needed   Past Surgical History:  Procedure Laterality Date   AORTIC VALVE REPLACEMENT N/A 09/16/2021   Procedure: AORTIC VALVE REPLACEMENT (AVR);  Surgeon: Alleen Borne, MD;  Location: [facility] OR;  Service: Open Heart Surgery;  Laterality: N/A;   CATARACT EXTRACTION   4 YRS AGO   BOTH EYES   CHOLECYSTECTOMY  07/21/2011   Procedure: LAPAROSCOPIC CHOLECYSTECTOMY WITH INTRAOPERATIVE CHOLANGIOGRAM;  Surgeon: Kandis Cocking, MD;  [facility]: [facility];  Service: General;  Laterality: N/A;   CORONARY ANGIOPLASTY  2012   2 stents   coronary stenting     s/p PCI of the RCA by Dr Excell Seltzer 8/12 with 2 promus stents   CYSTOSCOPY W/ RETROGRADES Bilateral 12/13/2019   Procedure: CYSTOSCOPY WITH RETROGRADE PYELOGRAM;  Surgeon: Sebastian Ache, MD;  Location[facility];  Service: Urology;  Laterality: Bilateral;   CYSTOSCOPY  W/ RETROGRADES Bilateral 10/14/2020   Procedure: CYSTOSCOPY WITH RETROGRADE PYELOGRAM;  Surgeon: Sebastian Ache, MD;  Location: [facility];  Service: Urology;  Laterality: Bilateral;   CYSTOSCOPY W/ RETROGRADES Bilateral 12/16/2020   Procedure: CYSTOSCOPY WITH RETROGRADE PYELOGRAM;  Surgeon: Sebastian Ache, MD;  Location: [facility];  Service: Urology;  Laterality: Bilateral;   CYSTOSCOPY W/ RETROGRADES Bilateral 06/03/2022   Procedure: CYSTOSCOPY WITH RETROGRADE PYELOGRAM;  Surgeon: Sebastian Ache, MD;  [facility];  Service: Urology;  Laterality: Bilateral;   CYSTOSCOPY W/ RETROGRADES Bilateral 12/28/2022   Procedure: CYSTOSCOPY WITH RETROGRADE PYELOGRAM, FULGARATION OF BLEEDERS;   Surgeon: Loletta Parish., MD;  [facility];  Service: Urology;  Laterality: Bilateral;   CYSTOSCOPY WITH INJECTION N/A 03/03/2023   Procedure: CYSTOSCOPY WITH INDOCYANINE INJECTION;  Surgeon: Loletta Parish., MD;  [facility];  Service: Urology;  Laterality: N/A;  360 MINUTES   IR IMAGING GUIDED PORT INSERTION  05/29/2023   LEFT AND RIGHT HEART CATHETERIZATION WITH CORONARY ANGIOGRAM N/A 06/23/2014   Procedure: LEFT AND RIGHT HEART CATHETERIZATION WITH CORONARY ANGIOGRAM;  Surgeon: Laurey Morale, MD;  Location: [facility] CATH LAB;  Service: Cardiovascular;  Laterality: N/A;   NECK SURGERY  03/23/09   per Dr. Ophelia Charter, cervical fusion    PERICARDIOCENTESIS N/A 09/28/2021   Procedure: PERICARDIOCENTESIS;  Surgeon: Corky Crafts, MD;  Location: [facility] INVASIVE CV LAB;  Service: Cardiovascular;  Laterality: N/A;   REPLACEMENT ASCENDING AORTA N/A 09/16/2021   Procedure: REPLACEMENT ASCENDING AORTA WITH 30 X HEMASHIELD PLATINUM WOVEN DOUBLE VELOUR VASCULAR GRAFT;  Surgeon: Alleen Borne, MD;  [facility];  Service: Open Heart Surgery;  Laterality: N/A;  CIRC ARREST   right elbow surgery     RIGHT HEART CATH N/A 06/15/2023   Procedure: RIGHT HEART CATH;  Surgeon: Laurey Morale, MD;  Location: [facility] INVASIVE CV LAB;  Service: Cardiovascular;  Laterality: N/A;   RIGHT HEART CATH AND CORONARY ANGIOGRAPHY N/A 07/08/2021   Procedure: RIGHT HEART CATH AND CORONARY ANGIOGRAPHY;  Surgeon: Laurey Morale, MD;  Location: [facility] INVASIVE CV LAB;  Service: Cardiovascular;  Laterality: N/A;   ROBOT ASSISTED LAPAROSCOPIC COMPLETE CYSTECT ILEAL CONDUIT N/A 03/03/2023   Procedure: XI ROBOTIC ASSISTED LAPAROSCOPIC COMPLETE CYSTECTECTOMY WITH  ILEAL CONDUIT DIVERSION;  Surgeon: Loletta Parish., MD;  [facility];  Service: Urology;  Laterality: N/A;   ROBOT ASSISTED LAPAROSCOPIC RADICAL PROSTATECTOMY N/A 03/03/2023   Procedure: XI ROBOTIC ASSISTED LAPAROSCOPIC RADICAL PROSTATECTOMY WITH LYMPH  NODE DISSECTION;  Surgeon: Loletta Parish., MD;  [facility];  Service: Urology;  Laterality: N/A;   solonscopy  05/23/08   per Dr. Celene Kras hemorrhoids only, repeat in 5 years   SUBXYPHOID PERICARDIAL WINDOW N/A 09/28/2021   Procedure: SUBXYPHOID PERICARDIAL WINDOW;  Surgeon: Alleen Borne, MD;  [facility];  Service: Thoracic;  Laterality: N/A;   TEE WITHOUT CARDIOVERSION N/A 06/23/2014   Procedure: TRANSESOPHAGEAL ECHOCARDIOGRAM (TEE);  Surgeon: Laurey Morale, MD;  Location: [facility] ENDOSCOPY;  Service: Cardiovascular;  Laterality: N/A;   TEE WITHOUT CARDIOVERSION N/A 01/21/2016   Procedure: TRANSESOPHAGEAL ECHOCARDIOGRAM (TEE);  Surgeon: Laurey Morale, MD;  Location: [facility] ENDOSCOPY;  Service: Cardiovascular;  Laterality: N/A;   TEE WITHOUT CARDIOVERSION N/A 09/16/2021   Procedure: TRANSESOPHAGEAL ECHOCARDIOGRAM (TEE);  Surgeon: Alleen Borne, MD;  Location: [facility] OR;  Service: Open Heart Surgery;  Laterality: N/A;   TEE WITHOUT CARDIOVERSION N/A 09/28/2021   Procedure: TRANSESOPHAGEAL ECHOCARDIOGRAM (TEE);  Surgeon: Evelene Croon  K, MD;  [facility];  Service: Thoracic;  Laterality: N/A;   TONSILLECTOMY     TRANSESOPHAGEAL ECHOCARDIOGRAM (CATH LAB) N/A 04/17/2023   Procedure: TRANSESOPHAGEAL ECHOCARDIOGRAM;  Surgeon: Jake Bathe, MD;  [facility]: [facility] INVASIVE CV LAB;  Service: Cardiovascular;  Laterality: N/A;   TRANSURETHRAL RESECTION OF BLADDER TUMOR N/A 10/21/2019   Procedure: TRANSURETHRAL RESECTION OF BLADDER TUMOR (TURBT);  Surgeon: Ihor Gully, MD;  Location: [facility];  Service: Urology;  Laterality: N/A;   TRANSURETHRAL RESECTION OF BLADDER TUMOR N/A 12/13/2019   Procedure: TRANSURETHRAL RESECTION OF BLADDER TUMOR (TURBT);  Surgeon: Sebastian Ache, MD;  Location: [facility];  Service: Urology;  Laterality: N/A;  1 HR   TRANSURETHRAL RESECTION OF BLADDER TUMOR N/A 10/14/2020   Procedure: TRANSURETHRAL RESECTION OF BLADDER TUMOR  (TURBT);  Surgeon: Sebastian Ache, MD;  Location: [facility];  Service: Urology;  Laterality: N/A;   TRANSURETHRAL RESECTION OF BLADDER TUMOR N/A 12/16/2020   Procedure: RESTAGING TRANSURETHRAL RESECTION OF BLADDER TUMOR (TURBT);  Surgeon: Sebastian Ache, MD;  Location[facility];  Service: Urology;  Laterality: N/A;   TRANSURETHRAL RESECTION OF BLADDER TUMOR N/A 06/03/2022   Procedure: TRANSURETHRAL RESECTION OF BLADDER TUMOR (TURBT);  Surgeon: Sebastian Ache, MD;  [facility];  Service: Urology;  Laterality: N/A;   UMBILICAL HERNIA REPAIR  03/03/2023   Procedure: HERNIA REPAIR UMBILICAL;  Surgeon: Loletta Parish., MD;  [facility];  Service: Urology;;    Home Medications:  Medications Prior to Admission  Medication Sig Dispense Refill Last Dose/Taking   acetaminophen (TYLENOL) 325 MG tablet Take 650 mg by mouth every 6 (six) hours as needed (for pain).   06/26/2023   amLODipine (NORVASC) 5 MG tablet Take 1 tablet (5 mg total) by mouth daily. 90 tablet 1 Past Week   aspirin EC 81 MG tablet Take 81 mg by mouth in the morning.   06/26/2023 Morning   carvedilol (COREG) 3.125 MG tablet TAKE 1 TABLET BY MOUTH TWICE A DAY WITH FOOD 180 tablet 1 06/26/2023   cyanocobalamin (VITAMIN B12) 1000 MCG tablet Take 1 tablet (1,000 mcg total) by mouth daily. 30 tablet 6 06/26/2023   diazepam (VALIUM) 5 MG tablet TAKE 1 TABLET BY MOUTH EVERY 12 HOURS AS NEEDED FOR ANXIETY. 60 tablet 5 Taking As Needed   furosemide (LASIX) 20 MG tablet Take 1 tablet (20 mg total) by mouth daily. 45 tablet 3 06/26/2023   ipratropium (ATROVENT) 0.03 % nasal spray Place 2 sprays into both nostrils every 12 (twelve) hours as needed for rhinitis.   Taking As Needed   lidocaine-prilocaine (EMLA) cream Apply 1 Application topically as needed. 30 g 0 Taking As Needed   Magnesium 500 MG TABS Take 500 mg by mouth in the morning.   06/26/2023   meclizine (ANTIVERT) 25 MG tablet Take 1 tablet (25 mg  total) by mouth 3 (three) times daily as needed for dizziness. 30 tablet 0 Taking As Needed   multivitamin-iron-minerals-folic acid (CENTRUM) chewable tablet Chew 1 tablet by mouth daily.   06/26/2023   nitroGLYCERIN (NITROSTAT) 0.4 MG SL tablet Place 0.4 mg under the tongue every 5 (five) minutes as needed for chest pain.   Taking As Needed   ondansetron (ZOFRAN) 8 MG tablet Take 1 tablet (8 mg total) by mouth every 8 (eight) hours as needed. 30 tablet 0 Taking As Needed   potassium chloride SA (KLOR-CON M20) 20 MEQ tablet Take 1 tablet (20 mEq total) by mouth  daily.   06/26/2023 Morning   prochlorperazine (COMPAZINE) 10 MG tablet Take 1 tablet (10 mg total) by mouth every 6 (six) hours as needed for nausea or vomiting. 30 tablet 0 Taking As Needed   rosuvastatin (CRESTOR) 20 MG tablet TAKE 1 TABLET BY MOUTH EVERYDAY AT BEDTIME 90 tablet 3 06/26/2023   venlafaxine XR (EFFEXOR-XR) 150 MG 24 hr capsule TAKE 1 CAPSULE BY MOUTH DAILY WITH BREAKFAST. 90 capsule 0 06/26/2023 Morning   JARDIANCE 10 MG TABS tablet Take 1 tablet (10 mg total) by mouth daily. (Patient not taking: Reported on 06/20/2023) 30 tablet 11    Allergies: No Known Allergies  Family History  Problem Relation Age of Onset   Lung cancer Mother        lung   Esophageal cancer Cousin    Colon cancer Neg Hx    Rectal cancer Neg Hx    Stomach cancer Neg Hx    Social History:  reports that he quit smoking about 13 years ago. His smoking use included cigarettes. He started smoking about 53 years ago. He has a 80 pack-year smoking history. He has never been exposed to tobacco smoke. He has never used smokeless tobacco. He reports that he does not drink alcohol and does not use drugs.  ROS: A complete review of systems was performed.  All systems are negative except for pertinent findings as noted. Review of Systems  All other systems reviewed and are negative.    Physical Exam:  Vital signs in last 24 hours: Temp:  [97.4 F (36.3  C)-98.1 F (36.7 C)] 97.4 F (36.3 C) (03/09 4696) Pulse Rate:  [50-68] 55 (03/09 0613) Resp:  [15-20] 18 (03/09 0613) BP: (127-164)/(61-100) 141/74 (03/09 0613) SpO2:  [97 %-100 %] 100 % (03/09 0613) FiO2 (%):  [21 %] 21 % (03/08 2158) General:  Alert and oriented, No acute distress HEENT: Normocephalic, atraumatic Cardiovascular: Regular rate and rhythm Lungs: Regular rate and effort Abdomen: Soft, nontender, nondistended, no abdominal masses, ileostomy pink and viable.  Foley in ileal conduit - yellow urine present non purulent appearing Back: No CVA tenderness Extremities: No edema Neurologic: Grossly intact  Laboratory Data:  Results for orders placed or performed during the hospital encounter of 06/26/23 (from the past 24 hours)  Protime-INR     Status: Abnormal   Collection Time: 07/01/23 12:09 PM  Result Value Ref Range   Prothrombin Time 15.5 (H) 11.4 - 15.2 seconds   INR 1.2 0.8 - 1.2  Prepare RBC (crossmatch)     Status: None   Collection Time: 07/01/23 12:09 PM  Result Value Ref Range   Order Confirmation      ORDER PROCESSED BY BLOOD BANK Performed at [facility], [address], [place], [place] [address]   Type and screen [facility]     Status: None (Preliminary result)   Collection Time: 07/01/23 12:09 PM  Result Value Ref Range   ABO/RH(D) A NEG    Antibody Screen NEG    Sample Expiration 07/04/2023,2359    Unit Number [id number]    Blood Component Type RED CELLS,LR    Unit division 00    Status of Unit ISSUED    Transfusion Status OK TO TRANSFUSE    Crossmatch Result      Compatible Performed at [facility], [address], [place], [place] [address]   Aerobic/Anaerobic Culture w Gram Stain (surgical/deep wound)     Status: None (Preliminary result)   Collection  Time: 07/01/23  4:21 PM   Specimen: Kidney; Urine  Result Value Ref Range   Specimen Description      KIDNEY LEFT Performed  at [facility], [address] [place], [place] [address]    Special Requests      NONE Performed at [facility], [address] [place], [place] [address]    Gram Stain      NO WBC SEEN RARE GRAM NEGATIVE RODS Performed at [facility], [address] [place], [place] [address]    Culture PENDING    Report Status PENDING   Aerobic/Anaerobic Culture w Gram Stain (surgical/deep wound)     Status: None (Preliminary result)   Collection Time: 07/01/23  4:21 PM   Specimen: Kidney  Result Value Ref Range   Specimen Description      KIDNEY RIGHT Performed at [facility], [address], [place], [place] [address]    Special Requests      NONE Performed at [facility], [address] [place], [place] [address]    Gram Stain      NO WBC SEEN NO ORGANISMS SEEN Performed at [facility], [address], [place], [place] [address]    Culture PENDING    Report Status PENDING   Culture, blood (Routine X 2) w Reflex to ID Panel     Status: None (Preliminary result)   Collection Time: 07/01/23  6:21 PM   Specimen: BLOOD  Result Value Ref Range   Specimen Description      BLOOD BLOOD RIGHT HAND AEROBIC BOTTLE ONLY Performed at [facility], [address] [place], [place] [address]    Special Requests      Blood Culture adequate volume Performed at [facility], [address] [place], [place] [address]    Culture      NO GROWTH < 12 HOURS Performed at [facility], [address], [place], [place] [address]    Report Status PENDING   Culture, blood (Routine X 2) w Reflex to ID Panel     Status: None (Preliminary result)   Collection Time: 07/01/23  6:21 PM   Specimen: BLOOD  Result Value Ref Range   Specimen Description      BLOOD BLOOD RIGHT HAND AEROBIC BOTTLE ONLY Performed at [facility], [address] [place], [place] [address]     Special Requests      Blood Culture adequate volume Performed at [facility], [address], [place], [place] [address]    Culture      NO GROWTH < 12 HOURS Performed at [facility], [address] [place], [place] [address]    Report Status PENDING   CBC with Differential/Platelet     Status: Abnormal   Collection Time: 07/02/23  3:29 AM  Result Value Ref Range   WBC 9.7 4.0 - 10.5 K/uL   RBC 2.91 (L) 4.22 - 5.81 MIL/uL   Hemoglobin 8.3 (L) 13.0 - 17.0 g/dL   HCT 57.3 (L) 22.0 - 25.4 %   MCV 91.4 80.0 - 100.0 fL   MCH 28.5 26.0 - 34.0 pg   MCHC 31.2 30.0 - 36.0 g/dL   RDW 27.0 62.3 - 76.2 %   Platelets 121 (L) 150 - 400 K/uL   nRBC 0.0 0.0 - 0.2 %   Neutrophils Relative % 91 %   Neutro Abs 8.7 (  H) 1.7 - 7.7 K/uL   Lymphocytes Relative 4 %   Lymphs Abs 0.4 (L) 0.7 - 4.0 K/uL   Monocytes Relative 4 %   Monocytes Absolute 0.4 0.1 - 1.0 K/uL   Eosinophils Relative 0 %   Eosinophils Absolute 0.0 0.0 - 0.5 K/uL   Basophils Relative 0 %   Basophils Absolute 0.0 0.0 - 0.1 K/uL   Immature Granulocytes 1 %   Abs Immature Granulocytes 0.09 (H) 0.00 - 0.07 K/uL  Comprehensive metabolic panel     Status: Abnormal   Collection Time: 07/02/23  3:29 AM  Result Value Ref Range   Sodium 134 (L) 135 - 145 mmol/L   Potassium 4.9 3.5 - 5.1 mmol/L   Chloride 107 98 - 111 mmol/L   CO2 18 (L) 22 - 32 mmol/L   Glucose, Bld 134 (H) 70 - 99 mg/dL   BUN 39 (H) 8 - 23 mg/dL   Creatinine, Ser 9.14 (H) 0.61 - 1.24 mg/dL   Calcium 7.9 (L) 8.9 - 10.3 mg/dL   Total Protein 6.9 6.5 - 8.1 g/dL   Albumin 2.4 (L) 3.5 - 5.0 g/dL   AST 27 15 - 41 U/L   ALT 44 0 - 44 U/L   Alkaline Phosphatase 146 (H) 38 - 126 U/L   Total Bilirubin 0.7 0.0 - 1.2 mg/dL   GFR, Estimated 14 (L) >60 mL/min   Anion gap 9 5 - 15  Magnesium     Status: None   Collection Time: 07/02/23  3:29 AM  Result Value Ref Range   Magnesium 1.8 1.7 - 2.4 mg/dL  Phosphorus     Status: Abnormal   Collection  Time: 07/02/23  3:29 AM  Result Value Ref Range   Phosphorus 5.6 (H) 2.5 - 4.6 mg/dL    Creatinine: Recent Labs    06/26/23 2100 06/27/23 1105 06/28/23 0500 06/29/23 0423 06/30/23 0349 07/01/23 0356 07/02/23 0329  CREATININE 2.26* 2.59* 2.69* 3.01* 3.25* 3.80* 4.27*    Impression/Assessment:  68 year old status post radical cystoprostatectomy and ileal conduit November 2024 with a rising creatinine since the stents have been removed with a bilateral hydroureteronephrosis and distended ostomy on CT.  The ureters appear widely patent to the conduit and the conduit is full of urine.  Possible obstruction at the fascia.  Metastatic disease being treated with pelvic radiation with chemotherapy on hold due to rising creatinine.  Update:  POD1 bilateral neph tubes - significant diuresis, 2.9L over past 24 hours (450 from Left, 2.2L from right, 250 from conduit) Conduit catheter fell out yesterday, replaced overnight.  Remaisn HDS Creatinine still uptrending, 4.2 from 3.8. Hg stable  Plan:  -continue nephrostomy tubes and conduit catheter at this time -recommend nephrology consult given increasing creatinine after maximal urinary diversion -recommend IV fluid resuscitation in setting of post-obstructive diuresis picture  -urology will follow  Ermon Sagan 07/02/2023, 8:46 AM    I have seen and examined the patient and agree with the above assessment and plan.  B/l PCNs placed yesterday with post-obstructive diuresis ( from left and 2.2L form right). Creatinine with rise to 4.2 from to 3.8.  -Continue MIVF given profound post-obstructive diuresis  -Rec nephrology consult given persistent AKI on CKD despite b/l PCNs -Following  Matt R. Keylin Podolsky MD [facility]  Pager: [phone]

## 2023-07-02 NOTE — Consult Note (Signed)
 Renal Service Consult Note [facility]  Luis [name] 07/02/2023 [name], MD Requesting Physician: Dr. Lowell Guitar    Reason for Consult: Renal failure HPI: The patient is a 68 y.o. year-old w/ PMH as below who had a radical cystoscopy prostatectomy in November 2024.  He had node positive disease followed by Dr. [name].  He had 1 cycle of chemotherapy with gemcitabine/ cisplatin then that was held due to rising creatinine.  He underwent pelvic radiation and he saw Dr. [name] in December and had his last ureteral stent removed in the office. On March 4 here hd had a CAT scan of the abdomen and pelvis which showed bilateral hydronephrosis.  IR did bilateral percutaneous nephrostomy tubes on March 8.  Today the creatinine has not improved and is up to 4.2. We are asked to see for renal failure.   Pt seen in room, family at bedside.  Patient states she feels fine he denies any nausea vomiting, swelling of the extremities.  He has an appointment with [facility] the first appointment is supposed to be this week on Wednesday.   Blood pressures since arrival have been SBP 110-140 for the most part.  Had brief BP's 90-100s on 3/05 early am.  RA 99-100% I's and O's are 6.2 L and and 7.9 L out equals net -1.7 L    PMH Anxiety CAD Chronic diastolic CHF Depression HTN HL Sleep apnea   ROS - denies CP, no joint pain, no HA, no blurry vision, no rash, no diarrhea, no nausea/ vomiting   Past Medical History  Past Medical History:  Diagnosis Date   Anxiety    takes Valium as needed   Aortic stenosis, moderate    Arthritis    Ascending aortic aneurysm (HCC)    CAD (coronary artery disease)    a. s/p PCI of the RCA 8/12 with DES by Dr Excell Seltzer, preserved EF. b. LHC/RHC (2/16) with mean RA 12, PA 32/15, mean PCWP 18, CI 3.47; patent mid and distal RCA stents, 50-60% proximal stenosis small PDA.      Cancer [facility])    bladder   Carotid stenosis    a. Carotid US  (05/2013):  Bilateral 1-39% ICA; L thyroid nodule (prior hx of aspiration).   Chronic diastolic CHF (congestive heart failure) (HCC)    Complication of anesthesia    difficulty waking up after gallbladder surgery   Depression    Dyspnea    Essential hypertension    GERD (gastroesophageal reflux disease)    if needed will take OTC meds    Heart murmur    History of colonic polyps    hyperplastic   Hyperlipidemia    Joint pain    Lesion of bladder    Myocardial infarction (HCC) 2012   Obesity (BMI 30-39.9) 02/29/2016   Pre-diabetes    Restless leg    Sleep apnea    uses cpap   Tubular adenoma of colon    Vertigo    takes Meclizine as needed   Past Surgical History  Past Surgical History:  Procedure Laterality Date   AORTIC VALVE REPLACEMENT N/A 09/16/2021   Procedure: AORTIC VALVE REPLACEMENT (AVR);  Surgeon: Alleen Borne, MD;  Location: [facility] OR;  Service: Open Heart Surgery;  Laterality: N/A;   CATARACT EXTRACTION   4 YRS AGO   BOTH EYES   CHOLECYSTECTOMY  07/21/2011   Procedure: LAPAROSCOPIC CHOLECYSTECTOMY WITH INTRAOPERATIVE CHOLANGIOGRAM;  Surgeon: Kandis Cocking, MD;  Location: [facility]S;  Service:  General;  Laterality: N/A;   CORONARY ANGIOPLASTY  2012   2 stents   coronary stenting     s/p PCI of the RCA by Dr Excell Seltzer 8/12 with 2 promus stents   CYSTOSCOPY W/ RETROGRADES Bilateral 12/13/2019   Procedure: CYSTOSCOPY WITH RETROGRADE PYELOGRAM;  Surgeon: Sebastian Ache, MD;  Location: [facility];  Service: Urology;  Laterality: Bilateral;   CYSTOSCOPY W/ RETROGRADES Bilateral 10/14/2020   Procedure: CYSTOSCOPY WITH RETROGRADE PYELOGRAM;  Surgeon: Sebastian Ache, MD;  Location: [facility];  Service: Urology;  Laterality: Bilateral;   CYSTOSCOPY W/ RETROGRADES Bilateral 12/16/2020   Procedure: CYSTOSCOPY WITH RETROGRADE PYELOGRAM;  Surgeon: Sebastian Ache, MD;  Location: [facility];  Service: Urology;  Laterality: Bilateral;    CYSTOSCOPY W/ RETROGRADES Bilateral 06/03/2022   Procedure: CYSTOSCOPY WITH RETROGRADE PYELOGRAM;  Surgeon: Sebastian Ache, MD;  Location: [facility];  Service: Urology;  Laterality: Bilateral;   CYSTOSCOPY W/ RETROGRADES Bilateral 12/28/2022   Procedure: CYSTOSCOPY WITH RETROGRADE PYELOGRAM, FULGARATION OF BLEEDERS;  Surgeon: Loletta Parish., MD;  Location: [facility];  Service: Urology;  Laterality: Bilateral;   CYSTOSCOPY WITH INJECTION N/A 03/03/2023   Procedure: CYSTOSCOPY WITH INDOCYANINE INJECTION;  Surgeon: Loletta Parish., MD;  Location: [facility];  Service: Urology;  Laterality: N/A;  360 MINUTES   IR IMAGING GUIDED PORT INSERTION  05/29/2023   LEFT AND RIGHT HEART CATHETERIZATION WITH CORONARY ANGIOGRAM N/A 06/23/2014   Procedure: LEFT AND RIGHT HEART CATHETERIZATION WITH CORONARY ANGIOGRAM;  Surgeon: Laurey Morale, MD;  Location: [facility] CATH LAB;  Service: Cardiovascular;  Laterality: N/A;   NECK SURGERY  03/23/09   per Dr. Ophelia Charter, cervical fusion    PERICARDIOCENTESIS N/A 09/28/2021   Procedure: PERICARDIOCENTESIS;  Surgeon: Corky Crafts, MD;  Location: [facility] INVASIVE CV LAB;  Service: Cardiovascular;  Laterality: N/A;   REPLACEMENT ASCENDING AORTA N/A 09/16/2021   Procedure: REPLACEMENT ASCENDING AORTA WITH 30 X HEMASHIELD PLATINUM WOVEN DOUBLE VELOUR VASCULAR GRAFT;  Surgeon: Alleen Borne, MD;  Location: [facility] OR;  Service: Open Heart Surgery;  Laterality: N/A;  CIRC ARREST   right elbow surgery     RIGHT HEART CATH N/A 06/15/2023   Procedure: RIGHT HEART CATH;  Surgeon: Laurey Morale, MD;  Location: [facility] INVASIVE CV LAB;  Service: Cardiovascular;  Laterality: N/A;   RIGHT HEART CATH AND CORONARY ANGIOGRAPHY N/A 07/08/2021   Procedure: RIGHT HEART CATH AND CORONARY ANGIOGRAPHY;  Surgeon: Laurey Morale, MD;  Location: [facility] INVASIVE CV LAB;  Service: Cardiovascular;  Laterality: N/A;   ROBOT ASSISTED LAPAROSCOPIC COMPLETE CYSTECT ILEAL CONDUIT N/A 03/03/2023   Procedure: XI ROBOTIC  ASSISTED LAPAROSCOPIC COMPLETE CYSTECTECTOMY WITH  ILEAL CONDUIT DIVERSION;  Surgeon: Loletta Parish., MD;  Location: [facility];  Service: Urology;  Laterality: N/A;   ROBOT ASSISTED LAPAROSCOPIC RADICAL PROSTATECTOMY N/A 03/03/2023   Procedure: XI ROBOTIC ASSISTED LAPAROSCOPIC RADICAL PROSTATECTOMY WITH LYMPH NODE DISSECTION;  Surgeon: Loletta Parish., MD;  Location: [facility];  Service: Urology;  Laterality: N/A;   solonscopy  05/23/08   per Dr. Celene Kras hemorrhoids only, repeat in 5 years   SUBXYPHOID PERICARDIAL WINDOW N/A 09/28/2021   Procedure: SUBXYPHOID PERICARDIAL WINDOW;  Surgeon: Alleen Borne, MD;  Location: [facility] OR;  Service: Thoracic;  Laterality: N/A;   TEE WITHOUT CARDIOVERSION N/A 06/23/2014   Procedure: TRANSESOPHAGEAL ECHOCARDIOGRAM (TEE);  Surgeon: Laurey Morale, MD;  Location: [facility] ENDOSCOPY;  Service: Cardiovascular;  Laterality: N/A;   TEE WITHOUT CARDIOVERSION N/A 01/21/2016  Procedure: TRANSESOPHAGEAL ECHOCARDIOGRAM (TEE);  Surgeon: Laurey Morale, MD;  Location: [facility] ENDOSCOPY;  Service: Cardiovascular;  Laterality: N/A;   TEE WITHOUT CARDIOVERSION N/A 09/16/2021   Procedure: TRANSESOPHAGEAL ECHOCARDIOGRAM (TEE);  Surgeon: Alleen Borne, MD;  Location: [facility] OR;  Service: Open Heart Surgery;  Laterality: N/A;   TEE WITHOUT CARDIOVERSION N/A 09/28/2021   Procedure: TRANSESOPHAGEAL ECHOCARDIOGRAM (TEE);  Surgeon: Alleen Borne, MD;  Location: [facility] OR;  Service: Thoracic;  Laterality: N/A;   TONSILLECTOMY     TRANSESOPHAGEAL ECHOCARDIOGRAM (CATH LAB) N/A 04/17/2023   Procedure: TRANSESOPHAGEAL ECHOCARDIOGRAM;  Surgeon: Jake Bathe, MD;  Location: [facility] INVASIVE CV LAB;  Service: Cardiovascular;  Laterality: N/A;   TRANSURETHRAL RESECTION OF BLADDER TUMOR N/A 10/21/2019   Procedure: TRANSURETHRAL RESECTION OF BLADDER TUMOR (TURBT);  Surgeon: Ihor Gully, MD;  Location: [facility];  Service: Urology;  Laterality: N/A;   TRANSURETHRAL RESECTION OF  BLADDER TUMOR N/A 12/13/2019   Procedure: TRANSURETHRAL RESECTION OF BLADDER TUMOR (TURBT);  Surgeon: Sebastian Ache, MD;  Location: [facility];  Service: Urology;  Laterality: N/A;  1 HR   TRANSURETHRAL RESECTION OF BLADDER TUMOR N/A 10/14/2020   Procedure: TRANSURETHRAL RESECTION OF BLADDER TUMOR (TURBT);  Surgeon: Sebastian Ache, MD;  Location: [facility];  Service: Urology;  Laterality: N/A;   TRANSURETHRAL RESECTION OF BLADDER TUMOR N/A 12/16/2020   Procedure: RESTAGING TRANSURETHRAL RESECTION OF BLADDER TUMOR (TURBT);  Surgeon: Sebastian Ache, MD;  Location: [facility];  Service: Urology;  Laterality: N/A;   TRANSURETHRAL RESECTION OF BLADDER TUMOR N/A 06/03/2022   Procedure: TRANSURETHRAL RESECTION OF BLADDER TUMOR (TURBT);  Surgeon: Sebastian Ache, MD;  Location: [facility];  Service: Urology;  Laterality: N/A;   UMBILICAL HERNIA REPAIR  03/03/2023   Procedure: HERNIA REPAIR UMBILICAL;  Surgeon: Loletta Parish., MD;  Location: [facility];  Service: Urology;;   Family History  Family History  Problem Relation Age of Onset   Lung cancer Mother        lung   Esophageal cancer Cousin    Colon cancer Neg Hx    Rectal cancer Neg Hx    Stomach cancer Neg Hx    Social History  reports that he quit smoking about 13 years ago. His smoking use included cigarettes. He started smoking about 53 years ago. He has a 80 pack-year smoking history. He has never been exposed to tobacco smoke. He has never used smokeless tobacco. He reports that he does not drink alcohol and does not use drugs. Allergies No Known Allergies Home medications Prior to Admission medications   Medication Sig Start Date End Date Taking? Authorizing Provider  acetaminophen (TYLENOL) 325 MG tablet Take 650 mg by mouth every 6 (six) hours as needed (for pain).   Yes [provider]  amLODipine (NORVASC) 5 MG tablet Take 1 tablet (5 mg total) by mouth daily. 05/16/23  Yes  Jaci Standard, MD  aspirin EC 81 MG tablet Take 81 mg by mouth in the morning.   Yes [provider]  carvedilol (COREG) 3.125 MG tablet TAKE 1 TABLET BY MOUTH TWICE A DAY WITH FOOD 05/10/23  Yes Laurey Morale, MD  cyanocobalamin (VITAMIN B12) 1000 MCG tablet Take 1 tablet (1,000 mcg total) by mouth daily. 06/15/23  Yes Jaci Standard, MD  diazepam (VALIUM) 5 MG tablet TAKE 1 TABLET BY MOUTH EVERY 12 HOURS AS NEEDED FOR ANXIETY. 03/29/23  Yes Nelwyn Salisbury, MD  furosemide (LASIX) 20  MG tablet Take 1 tablet (20 mg total) by mouth daily. 06/15/23  Yes Laurey Morale, MD  ipratropium (ATROVENT) 0.03 % nasal spray Place 2 sprays into both nostrils every 12 (twelve) hours as needed for rhinitis.   Yes [provider]  lidocaine-prilocaine (EMLA) cream Apply 1 Application topically as needed. 05/16/23  Yes Jaci Standard, MD  Magnesium 500 MG TABS Take 500 mg by mouth in the morning.   Yes [provider]  meclizine (ANTIVERT) 25 MG tablet Take 1 tablet (25 mg total) by mouth 3 (three) times daily as needed for dizziness. 09/09/22  Yes Nelwyn Salisbury, MD  multivitamin-iron-minerals-folic acid (CENTRUM) chewable tablet Chew 1 tablet by mouth daily. 09/23/21  Yes Barrett, Erin R, PA-C  nitroGLYCERIN (NITROSTAT) 0.4 MG SL tablet Place 0.4 mg under the tongue every 5 (five) minutes as needed for chest pain. 11/03/17 12/05/25 Yes [provider]  ondansetron (ZOFRAN) 8 MG tablet Take 1 tablet (8 mg total) by mouth every 8 (eight) hours as needed. 05/16/23  Yes Jaci Standard, MD  potassium chloride SA (KLOR-CON M20) 20 MEQ tablet Take 1 tablet (20 mEq total) by mouth daily. 05/26/23  Yes Milford, Anderson Malta, FNP  prochlorperazine (COMPAZINE) 10 MG tablet Take 1 tablet (10 mg total) by mouth every 6 (six) hours as needed for nausea or vomiting. 05/16/23  Yes Jaci Standard, MD  rosuvastatin (CRESTOR) 20 MG tablet TAKE 1 TABLET BY MOUTH EVERYDAY AT BEDTIME 02/06/23  Yes  Milford, Jessica M, FNP  venlafaxine XR (EFFEXOR-XR) 150 MG 24 hr capsule TAKE 1 CAPSULE BY MOUTH DAILY WITH BREAKFAST. 05/11/23  Yes Nelwyn Salisbury, MD  JARDIANCE 10 MG TABS tablet Take 1 tablet (10 mg total) by mouth daily. Patient not taking: Reported on 06/20/2023 05/10/23   Laurey Morale, MD     Vitals:   07/01/23 1727 07/01/23 1803 07/02/23 0149 07/02/23 0613  BP:  (!) 152/100 (!) 149/63 (!) 141/74  Pulse:  67 68 (!) 55  Resp:  18 16 18   Temp: (!) 97.5 F (36.4 C) 97.8 F (36.6 C) 98.1 F (36.7 C) (!) 97.4 F (36.3 C)  TempSrc: Oral Oral Oral   SpO2:  97% 100% 100%  Weight:      Height:       Exam Gen alert, no distress No rash, cyanosis or gangrene Sclera anicteric, throat clear  No jvd or bruits Chest clear bilat to bases, no rales/ wheezing RRR no MRG Abd soft ntnd no mass or ascites +bs, RLQ ileostomy bag w/ blood-tinged urine R and L PCN tubes w/ collection bags, R > L in terms of amt of urine GU normal male MS no joint effusions or deformity Ext no LE or UE edema, no other edema Neuro is alert, Ox 3 , nf, no asterixis    Renal-related home meds: - jardiance 10 mg daily - amlodipine 5 daily - coreg 3.125 twice daily - furosemide 20 Mg daily - klor-Con 20 mEq daily - others: venlafaxine XR, statin, diazepam, nitroglycerin SL, meclizine, atrovent, aspirin  Date   Creat  eGFR (ml/min) 2008- 2022  0.80- 1.10 2023   1.00- 1.34 feb- nov 2024  0.87- 1.32 Dec 2024  2.42 >> 1.36  Jan 2025  1.91- 2.08 34- 37 ml/min  2/04- 06/20/23  2.07- 2.37 29- 34 ml/min 3/03- 06/28/23  2.26- 2.70 26- 31 ml/min     3/06   3.01 3/07   3.25 3/08  3.80  Bilat PCN's placed by IR 3/09   4.27    Labs Na 134 K4.9 BUN 39 creatinine 4.27 calcium 7.9 albumin 2.4 Hemoglobin 8.3 WBC 9.7 UA 3/04 - large LE, small Hgb, negative protein, many bacteria, 0-5 RBC, 11-20 WBC  Assessment/ Plan: AKI on CKD 3b - b/l creatinine 1.9- 2.3 from jan - feb 2025, eGFR 29- 37 ml/min. Creat here was  3.0 on admit and 3.8 prior to bilat PCN placement yesterday. Creat today is 4.2. Pt is euvolemic on exam.  Urinalysis shows some white blood cells otherwise negative.  Patient is getting IV fluids does not look significantly dehydrated.  Occasionally after a period of hydronephrosis/obstruction, the time to improvement in renal function can be delayed. Main thing is to avoid vol depletion or vol overload. Pt looks fine today. Will cont LR at 150 cc/hr. Will follow.  Klebsiella bacteremia/ sepsis - getting po abx Bladder cancer / sp cystectomy and prostatectomy with ileal conduit as bladder      Vinson Moselle  MD [facility] 07/02/2023, 12:22 PM  Recent Labs  Lab 06/30/23 0349 07/01/23 0356 07/02/23 0329  HGB 7.6* 7.5* 8.3*  ALBUMIN 2.3*  --  2.4*  CALCIUM 7.8* 7.8* 7.9*  PHOS 5.1* 5.5* 5.6*  CREATININE 3.25* 3.80* 4.27*  K 4.0 3.9 4.9   Inpatient medications:  sodium chloride   Intravenous Once   amLODipine  5 mg Oral Daily   carvedilol  3.125 mg Oral BID WC   Chlorhexidine Gluconate Cloth  6 each Topical Daily   pantoprazole  40 mg Oral Daily   potassium chloride SA  20 mEq Oral Daily   rosuvastatin  20 mg Oral QHS   sodium chloride flush  10-40 mL Intracatheter Q12H   sodium chloride flush  3 mL Intravenous Q12H   venlafaxine XR  150 mg Oral Q breakfast    ampicillin-sulbactam (UNASYN) IV 3 g (07/02/23 0839)   lactated ringers 150 mL/hr at 07/02/23 0802   acetaminophen **OR** acetaminophen, diazepam, HYDROmorphone (DILAUDID) injection, ondansetron **OR** ondansetron (ZOFRAN) IV, senna-docusate, sodium chloride flush

## 2023-07-02 NOTE — Plan of Care (Signed)
  Problem: Clinical Measurements: Goal: Diagnostic test results will improve Outcome: Progressing   Problem: Pain Managment: Goal: General experience of comfort will improve and/or be controlled Outcome: Progressing   Problem: Safety: Goal: Ability to remain free from injury will improve Outcome: Progressing

## 2023-07-02 NOTE — Progress Notes (Signed)
 Patient's coude catheter fell out. On call Zettie Pho, MD paged. MD present at bedside, catheter reinserted. Will continue with plan of care.

## 2023-07-02 NOTE — Progress Notes (Signed)
 PROGRESS NOTE    Luis [name]  GNF:[id number] DOB: 03-02-1956 DOA: 06/26/2023 PCP: Nelwyn Salisbury, MD  Chief Complaint  Patient presents with   Fever    Brief Narrative:   68 year old male with history of high-grade bladder cancer status post cystoprostatectomy with urostomy in place, currently undergoing radiation therapy started Kmari Brian week prior to admission, chronic diastolic CHF, CAD with PCI to RCA, severe AAS status post bioprosthetic AVR and ascending aorta replacement, HTN, HLD, OSA on CPAP who comes into the hospital with fevers over the last 3 to 4 days.  Patient has Luis [name] recent history of septic shock due to Enterococcus bacteremia in December 2024, completed several weeks of IV ampicillin, and at that time there was no evidence of TEE and no infection of his aortic graft on the CT scan of the chest.  Current symptoms remind him of his prior episode and he decided to seek care.  In the ED he was febrile, but importantly was also found to be anemic with Luis [name] hemoglobin in the 5 range.  Patient is reported dark stools intermittently over the last couple of weeks   Assessment & Plan:   Principal Problem:   SIRS (systemic inflammatory response syndrome) (HCC) Active Problems:   Acute on chronic anemia   Renal dysfunction   CAD (coronary artery disease)   Hyperlipidemia   OSA (obstructive sleep apnea)   Chronic diastolic CHF (congestive heart failure) (HCC)   Essential hypertension   Bladder cancer (HCC)   History of aortic valve replacement with tissue graft  Sepsis due to Klebsiella Bacteremia Presented with fever, leukocytosis, and now with klebsiella bacteremia CT abdomen/pelvis with mild bilateral hydroureteronephrosis (cystectomy with ileal conduit placement and prostatectomy) UA could be c/w with UTI, but not overwhelmingly so -> urine culture multiple species Most likely source would seem to be urinary Rigors 3/8 after procedure, follow repeat blood cultures Follow final  culture results and adjust abx based on sensitivities -> unasyn for now Will discuss with ID given his recurrent bacteremia -> recommending 2 weeks augmentin (would start from 3/8 after procedure), follow up outpatient with ID  Acute kidney injury on CKD stage IV-baseline creatinine between 2 and 2.3, on admission it was 2.7 in the setting of febrile illness.  Creatinine worse today to 4.2 - what looks like post obstructive diuresis with 2.9 L out yesterday Will trend, mild hydro on imaging IVF will increase Renal consult Appreciate urology assistance -> s/p bilateral nephrostomy tubes  Rhinovirus Infection Seems mild, follow  Anemia, concern for melena -patient reports several weeks of intermittent melena, currently he is without frank bleeding and fecal occult is negative.   -Gastroenterology consulted, appreciate input - > s/p EGD with nonbleeding gastric ulcers without stigmata of bleeding (biopsied), continued PPI daily - follow pathology Transfuse as needed or for Hb <7 S/p 3 units pRBC   High-grade bladder cancer-status post total cystectomy with ileal conduit as well as prostatectomy-follows with oncology Dr. [name], recently started radiation therapy Ammara Raj week prior to this admission and has completed 6 cycles.  Hyponatremia -monitor, in the setting of CKD, Lasix   Chronic diastolic CHF-continue Coreg, hold Lasix for now as he is febrile and normotensive   Hyperlipidemia-continue Crestor   Essential hypertension-continue amlodipine and carvedilol, hold Lasix as above   CAD-with history of PCI to RCA, stable, denies chest pain.  Holding aspirin with his anemia.  Continue statin   OSA on CPAP-continue CPAP   Status post bioprosthetic AVR, ascending aorta replacement  noted  Obesity Body mass index is 39.85 kg/m.     DVT prophylaxis: SCD Code Status: full Family Communication: son and daughter in law at bedside Disposition:   Status is: Inpatient Remains inpatient  appropriate because: need for follow up culture results, ongoing care   Consultants:  GI  Procedures:  EGD  Antimicrobials:  Anti-infectives (From admission, onward)    Start     Dose/Rate Route Frequency Ordered Stop   07/01/23 2000  Ampicillin-Sulbactam (UNASYN) 3 g in sodium chloride 0.9 % 100 mL IVPB        3 g 200 mL/hr over 30 Minutes Intravenous Every 12 hours 07/01/23 1815     06/29/23 2200  amoxicillin-clavulanate (AUGMENTIN) 500-125 MG per tablet 1 tablet  Status:  Discontinued        1 tablet Oral 2 times daily 06/29/23 1516 07/01/23 1734   06/27/23 1700  cefTRIAXone (ROCEPHIN) 2 g in sodium chloride 0.9 % 100 mL IVPB  Status:  Discontinued        2 g 200 mL/hr over 30 Minutes Intravenous Daily-1600 06/27/23 1613 06/27/23 1613   06/27/23 1700  cefTRIAXone (ROCEPHIN) 2 g in sodium chloride 0.9 % 100 mL IVPB  Status:  Discontinued        2 g 200 mL/hr over 30 Minutes Intravenous Daily-1600 06/27/23 1614 06/29/23 1516   06/26/23 2330  Ampicillin-Sulbactam (UNASYN) 3 g in sodium chloride 0.9 % 100 mL IVPB  Status:  Discontinued        3 g 200 mL/hr over 30 Minutes Intravenous Every 8 hours 06/26/23 2318 06/27/23 1613       Subjective: C/o back pain after neprhostomy   Objective: Vitals:   07/01/23 1727 07/01/23 1803 07/02/23 0149 07/02/23 0613  BP:  (!) 152/100 (!) 149/63 (!) 141/74  Pulse:  67 68 (!) 55  Resp:  18 16 18   Temp: (!) 97.5 F (36.4 C) 97.8 F (36.6 C) 98.1 F (36.7 C) (!) 97.4 F (36.3 C)  TempSrc: Oral Oral Oral   SpO2:  97% 100% 100%  Weight:      Height:        Intake/Output Summary (Last 24 hours) at 07/02/2023 1235 Last data filed at 07/02/2023 1000 Gross per 24 hour  Intake 2190.02 ml  Output 3825 ml  Net -1634.98 ml    Filed Weights   06/27/23 0130 06/28/23 1316  Weight: 112 kg 112 kg    Examination:  General: No acute distress. Cardiovascular: RRR Lungs: unlabored Abdomen: s/nt/nd - blood tinged urine in L neprhostomy, clear  yellow in R Neurological: Alert and oriented 3. Moves all extremities 4 with equal strength. Cranial nerves II through XII grossly intact. Extremities: No clubbing or cyanosis. No edema.  Data Reviewed: I have personally reviewed following labs and imaging studies  CBC: Recent Labs  Lab 06/26/23 2100 06/27/23 1105 06/28/23 0500 06/28/23 1940 06/29/23 0423 06/30/23 0349 07/01/23 0356 07/02/23 0329  WBC 12.3*   < > 6.6  --  5.6 5.3 5.3 9.7  NEUTROABS 10.9*  --   --   --  4.1 3.7  --  8.7*  HGB 5.8*   < > 7.6* 7.6* 7.6* 7.6* 7.5* 8.3*  HCT 18.0*   < > 24.8* 24.2* 24.2* 24.3* 23.8* 26.6*  MCV 89.1   < > 90.8  --  89.6 89.3 89.5 91.4  PLT 252   < > 148*  --  159 141* 126* 121*   < > = values in this  interval not displayed.    Basic Metabolic Panel: Recent Labs  Lab 06/28/23 0500 06/29/23 0423 06/30/23 0349 07/01/23 0356 07/02/23 0329  NA 134* 136 137 134* 134*  K 4.1 3.9 4.0 3.9 4.9  CL 104 106 105 106 107  CO2 22 19* 21* 18* 18*  GLUCOSE 113* 117* 108* 106* 134*  BUN 33* 39* 41* 39* 39*  CREATININE 2.69* 3.01* 3.25* 3.80* 4.27*  CALCIUM 8.0* 8.2* 7.8* 7.8* 7.9*  MG 2.4 2.2 2.2 2.0 1.8  PHOS 4.7* 4.8* 5.1* 5.5* 5.6*    GFR: Estimated Creatinine Clearance: 19.2 mL/min (Mairlyn Tegtmeyer) (by C-G formula based on SCr of 4.27 mg/dL (H)).  Liver Function Tests: Recent Labs  Lab 06/27/23 1105 06/28/23 0500 06/29/23 0423 06/30/23 0349 07/02/23 0329  AST 29 30 57* 48* 27  ALT 29 37 59* 59* 44  ALKPHOS 100 105 159* 153* 146*  BILITOT 1.2 0.7 0.5 0.9 0.7  PROT 7.4 7.4 7.3 7.2 6.9  ALBUMIN 2.5* 2.4* 2.4* 2.3* 2.4*    CBG: No results for input(s): "GLUCAP" in the last 168 hours.   Recent Results (from the past 240 hours)  Resp panel by RT-PCR (RSV, Flu Astin Rape&B, Covid)     Status: None   Collection Time: 06/26/23  9:00 PM   Specimen: Nasal Swab  Result Value Ref Range Status   SARS Coronavirus 2 by RT PCR NEGATIVE NEGATIVE Final    Comment: (NOTE) SARS-CoV-2 target nucleic  acids are NOT DETECTED.  The SARS-CoV-2 RNA is generally detectable in upper respiratory specimens during the acute phase of infection. The lowest concentration of SARS-CoV-2 viral copies this assay can detect is 138 copies/mL. Omarius Grantham negative result does not preclude SARS-Cov-2 infection and should not be used as the sole basis for treatment or other patient management decisions. Kiyra Slaubaugh negative result may occur with  improper specimen collection/handling, submission of specimen other than nasopharyngeal swab, presence of viral mutation(s) within the areas targeted by this assay, and inadequate number of viral copies(<138 copies/mL). Moustafa Mossa negative result must be combined with clinical observations, patient history, and epidemiological information. The expected result is Negative.  Fact Sheet for Patients:  BloggerCourse.com  Fact Sheet for Healthcare Providers:  SeriousBroker.it  This test is no t yet approved or cleared by the [place] FDA and  has been authorized for detection and/or diagnosis of SARS-CoV-2 by FDA under an Emergency Use Authorization (EUA). This EUA will remain  in effect (meaning this test can be used) for the duration of the COVID-19 declaration under Section 564(b)(1) of the Act, 21 U.S.C.section 360bbb-3(b)(1), unless the authorization is terminated  or revoked sooner.       Influenza Hildred Pharo by PCR NEGATIVE NEGATIVE Final   Influenza B by PCR NEGATIVE NEGATIVE Final    Comment: (NOTE) The Xpert Xpress SARS-CoV-2/FLU/RSV plus assay is intended as an aid in the diagnosis of influenza from Nasopharyngeal swab specimens and should not be used as Taurean Ju sole basis for treatment. Nasal washings and aspirates are unacceptable for Xpert Xpress SARS-CoV-2/FLU/RSV testing.  Fact Sheet for Patients: BloggerCourse.com  Fact Sheet for Healthcare Providers: SeriousBroker.it  This  test is not yet approved or cleared by the [place] FDA and has been authorized for detection and/or diagnosis of SARS-CoV-2 by FDA under an Emergency Use Authorization (EUA). This EUA will remain in effect (meaning this test can be used) for the duration of the COVID-19 declaration under Section 564(b)(1) of the Act, 21 U.S.C. section 360bbb-3(b)(1), unless the authorization is terminated or  revoked.     Resp Syncytial Virus by PCR NEGATIVE NEGATIVE Final    Comment: (NOTE) Fact Sheet for Patients: BloggerCourse.com  Fact Sheet for Healthcare Providers: SeriousBroker.it  This test is not yet approved or cleared by the [place] FDA and has been authorized for detection and/or diagnosis of SARS-CoV-2 by FDA under an Emergency Use Authorization (EUA). This EUA will remain in effect (meaning this test can be used) for the duration of the COVID-19 declaration under Section 564(b)(1) of the Act, 21 U.S.C. section 360bbb-3(b)(1), unless the authorization is terminated or revoked.  Performed at [facility], [address], [place], [place] [address]   Blood Culture (routine x 2)     Status: Abnormal   Collection Time: 06/26/23  9:00 PM   Specimen: BLOOD  Result Value Ref Range Status   Specimen Description   Final    BLOOD BLOOD RIGHT HAND Performed at [facility], [address], [place], [place] [address]    Special Requests   Final    BOTTLES DRAWN AEROBIC AND ANAEROBIC Blood Culture results may not be optimal due to an inadequate volume of blood received in culture bottles Performed at [facility], [address], [place], [place] [address]    Culture  Setup Time   Final    GRAM NEGATIVE RODS ANAEROBIC BOTTLE ONLY CRITICAL RESULT CALLED TO, READ BACK BY AND VERIFIED WITH: Roni Bread [id number] @ 1549 FH Performed at [facility], [address], [place], [place] [address]    Culture KLEBSIELLA OXYTOCA (Aaleyah Witherow)  Final   Report Status 06/29/2023 FINAL  Final   Organism ID, Bacteria KLEBSIELLA OXYTOCA  Final      Susceptibility   Klebsiella oxytoca - MIC*    AMPICILLIN RESISTANT Resistant     CEFEPIME <=0.12 SENSITIVE Sensitive     CEFTAZIDIME <=1 SENSITIVE Sensitive     CEFTRIAXONE <=0.25 SENSITIVE Sensitive     CIPROFLOXACIN <=0.25 SENSITIVE Sensitive     GENTAMICIN <=1 SENSITIVE Sensitive     IMIPENEM 0.5 SENSITIVE Sensitive     TRIMETH/SULFA <=20 SENSITIVE Sensitive     AMPICILLIN/SULBACTAM 4 SENSITIVE Sensitive     PIP/TAZO <=4 SENSITIVE Sensitive ug/mL    * KLEBSIELLA OXYTOCA  Blood Culture (routine x 2)     Status: None   Collection Time: 06/26/23  9:00 PM   Specimen: BLOOD LEFT HAND  Result Value Ref Range Status   Specimen Description   Final    BLOOD LEFT HAND Performed at [facility], [address], [place], [place] [address]    Special Requests   Final    BOTTLES DRAWN AEROBIC AND ANAEROBIC Blood Culture results may not be optimal due to an inadequate volume of blood received in culture bottles Performed at [facility], [address] [place], [place] [address]    Culture   Final    NO GROWTH 5 DAYS Performed at [facility], [address], [place], [place] [address]    Report Status 07/01/2023 FINAL  Final  Blood Culture ID Panel (Reflexed)     Status: Abnormal   Collection Time: 06/26/23  9:00 PM  Result Value Ref Range Status   Enterococcus faecalis NOT DETECTED NOT DETECTED Final   Enterococcus Faecium NOT DETECTED NOT DETECTED Final   Listeria monocytogenes NOT DETECTED NOT DETECTED Final   Staphylococcus species NOT DETECTED NOT DETECTED Final   Staphylococcus aureus (BCID) NOT DETECTED  NOT DETECTED Final   Staphylococcus epidermidis NOT DETECTED NOT DETECTED Final   Staphylococcus lugdunensis NOT DETECTED NOT DETECTED Final   Streptococcus species NOT DETECTED NOT  DETECTED Final   Streptococcus agalactiae NOT DETECTED NOT DETECTED Final   Streptococcus pneumoniae NOT DETECTED NOT DETECTED Final   Streptococcus pyogenes NOT DETECTED NOT DETECTED Final   Marielouise Amey.calcoaceticus-baumannii NOT DETECTED NOT DETECTED Final   Bacteroides fragilis NOT DETECTED NOT DETECTED Final   Enterobacterales DETECTED (Katiana Ruland) NOT DETECTED Final    Comment: Enterobacterales represent Francene Mcerlean large order of gram negative bacteria, not Dorian Renfro single organism. CRITICAL RESULT CALLED TO, READ BACK BY AND VERIFIED WITH: PHARMD D. WOFFORD [id number] @ 1549 FH    Enterobacter cloacae complex NOT DETECTED NOT DETECTED Final   Escherichia coli NOT DETECTED NOT DETECTED Final   Klebsiella aerogenes NOT DETECTED NOT DETECTED Final   Klebsiella oxytoca DETECTED (Nicodemus Denk) NOT DETECTED Final    Comment: CRITICAL RESULT CALLED TO, READ BACK BY AND VERIFIED WITH: PHARMD D. [id number] @ 1549 FH    Klebsiella pneumoniae NOT DETECTED NOT DETECTED Final   Proteus species NOT DETECTED NOT DETECTED Final   Salmonella species NOT DETECTED NOT DETECTED Final   Serratia marcescens NOT DETECTED NOT DETECTED Final   Haemophilus influenzae NOT DETECTED NOT DETECTED Final   Neisseria meningitidis NOT DETECTED NOT DETECTED Final   Pseudomonas aeruginosa NOT DETECTED NOT DETECTED Final   Stenotrophomonas maltophilia NOT DETECTED NOT DETECTED Final   Candida albicans NOT DETECTED NOT DETECTED Final   Candida auris NOT DETECTED NOT DETECTED Final   Candida glabrata NOT DETECTED NOT DETECTED Final   Candida krusei NOT DETECTED NOT DETECTED Final   Candida parapsilosis NOT DETECTED NOT DETECTED Final   Candida tropicalis NOT DETECTED NOT DETECTED Final   Cryptococcus neoformans/gattii NOT DETECTED NOT DETECTED Final   CTX-M ESBL NOT DETECTED NOT DETECTED Final   Carbapenem resistance IMP NOT DETECTED NOT DETECTED Final   Carbapenem resistance KPC NOT DETECTED NOT DETECTED Final   Carbapenem resistance NDM NOT DETECTED NOT  DETECTED Final   Carbapenem resist OXA 48 LIKE NOT DETECTED NOT DETECTED Final   Carbapenem resistance VIM NOT DETECTED NOT DETECTED Final    Comment: Performed at [facility], [address], [place], [place] [address]  Urine Culture     Status: Abnormal   Collection Time: 06/27/23 12:24 AM   Specimen: Urine, Random  Result Value Ref Range Status   Specimen Description   Final    URINE, RANDOM Performed at [facility], [address], [place], [place] [address]    Special Requests   Final    NONE Reflexed from [id number] Performed at [facility], [address] [place], [place] [address]    Culture MULTIPLE SPECIES PRESENT, SUGGEST RECOLLECTION (Osamu Olguin)  Final   Report Status 06/28/2023 FINAL  Final  Respiratory (~20 pathogens) panel by PCR     Status: Abnormal   Collection Time: 06/27/23  6:28 AM   Specimen: Nasopharyngeal Swab; Respiratory  Result Value Ref Range Status   Adenovirus NOT DETECTED NOT DETECTED Final   Coronavirus 229E NOT DETECTED NOT DETECTED Final    Comment: (NOTE) The Coronavirus on the Respiratory Panel, DOES NOT test for the novel  Coronavirus (2019 nCoV)    Coronavirus HKU1 NOT DETECTED NOT DETECTED Final   Coronavirus NL63 NOT DETECTED NOT DETECTED Final   Coronavirus OC43 NOT DETECTED NOT DETECTED Final   Metapneumovirus NOT DETECTED NOT DETECTED Final  Rhinovirus / Enterovirus DETECTED (Albion Weatherholtz) NOT DETECTED Final   Influenza Julie-Ann Vanmaanen NOT DETECTED NOT DETECTED Final   Influenza B NOT DETECTED NOT DETECTED Final   Parainfluenza Virus 1 NOT DETECTED NOT DETECTED Final   Parainfluenza Virus 2 NOT DETECTED NOT DETECTED Final   Parainfluenza Virus 3 NOT DETECTED NOT DETECTED Final   Parainfluenza Virus 4 NOT DETECTED NOT DETECTED Final   Respiratory Syncytial Virus NOT DETECTED NOT DETECTED Final   Bordetella pertussis NOT DETECTED NOT DETECTED Final   Bordetella Parapertussis NOT DETECTED NOT DETECTED Final   Chlamydophila  pneumoniae NOT DETECTED NOT DETECTED Final   Mycoplasma pneumoniae NOT DETECTED NOT DETECTED Final    Comment: Performed at [facility], [address], [place], [place] [address]  Aerobic/Anaerobic Culture w Gram Stain (surgical/deep wound)     Status: None (Preliminary result)   Collection Time: 07/01/23  4:21 PM   Specimen: Kidney; Urine  Result Value Ref Range Status   Specimen Description   Final    KIDNEY LEFT Performed at [facility], [address] [place], [place] [address]    Special Requests   Final    NONE Performed at [facility], [address] [place], [place] [address]    Gram Stain   Final    NO WBC SEEN RARE GRAM NEGATIVE RODS Performed at [facility], [address], [place], [place] [address]    Culture PENDING  Incomplete   Report Status PENDING  Incomplete  Aerobic/Anaerobic Culture w Gram Stain (surgical/deep wound)     Status: None (Preliminary result)   Collection Time: 07/01/23  4:21 PM   Specimen: Kidney  Result Value Ref Range Status   Specimen Description   Final    KIDNEY RIGHT Performed at [facility], [address] [place], [place] [address]    Special Requests   Final    NONE Performed at [facility], [address] [place], [place] [address]    Gram Stain   Final    NO WBC SEEN NO ORGANISMS SEEN Performed at [facility], [address], [place], [place] [address]    Culture PENDING  Incomplete   Report Status PENDING  Incomplete  Culture, blood (Routine X 2) w Reflex to ID Panel     Status: None (Preliminary result)   Collection Time: 07/01/23  6:21 PM   Specimen: BLOOD  Result Value Ref Range Status   Specimen Description   Final    BLOOD BLOOD RIGHT HAND AEROBIC BOTTLE ONLY Performed at [facility], [address] [place], [place] [address]    Special Requests   Final    Blood Culture adequate volume Performed at [facility], [address] [place], [place] [address]    Culture   Final    NO GROWTH < 12 HOURS Performed at [facility], [address], [place], [place] [address]    Report Status PENDING  Incomplete  Culture, blood (Routine X 2) w Reflex to ID Panel     Status: None (Preliminary result)   Collection Time: 07/01/23  6:21 PM   Specimen: BLOOD  Result Value Ref Range Status   Specimen Description   Final    BLOOD BLOOD RIGHT HAND AEROBIC BOTTLE ONLY Performed at [facility], [address], [place], [place] [address]    Special Requests   Final    Blood Culture  adequate volume Performed at [facility], [address], [place], [place] [address]    Culture   Final    NO GROWTH < 12 HOURS Performed at [facility], [address], [place], [place] [address]    Report Status PENDING  Incomplete         Radiology Studies: No results found.       Scheduled Meds:  sodium chloride   Intravenous Once   amLODipine  5 mg Oral Daily   carvedilol  3.125 mg Oral BID WC   Chlorhexidine Gluconate Cloth  6 each Topical Daily   pantoprazole  40 mg Oral Daily   potassium chloride SA  20 mEq Oral Daily   rosuvastatin  20 mg Oral QHS   sodium chloride flush  10-40 mL Intracatheter Q12H   sodium chloride flush  3 mL Intravenous Q12H   venlafaxine XR  150 mg Oral Q breakfast   Continuous Infusions:  ampicillin-sulbactam (UNASYN) IV 3 g (07/02/23 0839)   lactated ringers 150 mL/hr at 07/02/23 0802     LOS: 5 days    Time spent: over 30 min    Lacretia Nicks, MD [facility]   To contact the attending provider between 7A-7P or the covering provider during after hours 7P-7A, please log into the web site [email or web address] and access using universal  password for that web site. If you do not have the password, please call the hospital operator.  07/02/2023, 12:35 PM

## 2023-07-02 NOTE — Progress Notes (Signed)
 Unable to drain patient's left nephrostomy bag due to clots. Drainage bag replaced.

## 2023-07-03 ENCOUNTER — Other Ambulatory Visit: Payer: Self-pay

## 2023-07-03 ENCOUNTER — Ambulatory Visit
Admission: RE | Admit: 2023-07-03 | Discharge: 2023-07-03 | Disposition: A | Discharge status: Home / Self Care | Payer: PPO | Source: Ambulatory Visit | Attending: Radiation Oncology | Admitting: Radiation Oncology

## 2023-07-03 DIAGNOSIS — C678 Malignant neoplasm of overlapping sites of bladder: Secondary | ICD-10-CM | POA: Diagnosis not present

## 2023-07-03 DIAGNOSIS — Z510 Encounter for antineoplastic radiation therapy: Secondary | ICD-10-CM | POA: Diagnosis not present

## 2023-07-03 DIAGNOSIS — R6510 Systemic inflammatory response syndrome (SIRS) of non-infectious origin without acute organ dysfunction: Secondary | ICD-10-CM | POA: Diagnosis not present

## 2023-07-03 LAB — BASIC METABOLIC PANEL
Anion gap: 7 (ref 5–15)
BUN: 36 mg/dL — ABNORMAL HIGH (ref 8–23)
CO2: 20 mmol/L — ABNORMAL LOW (ref 22–32)
Calcium: 7.7 mg/dL — ABNORMAL LOW (ref 8.9–10.3)
Chloride: 108 mmol/L (ref 98–111)
Creatinine, Ser: 3.51 mg/dL — ABNORMAL HIGH (ref 0.61–1.24)
GFR, Estimated: 18 mL/min — ABNORMAL LOW (ref >60)
Glucose, Bld: 120 mg/dL — ABNORMAL HIGH (ref 70–99)
Potassium: 4.1 mmol/L (ref 3.5–5.1)
Sodium: 135 mmol/L (ref 135–145)

## 2023-07-03 LAB — MAGNESIUM: Magnesium: 1.8 mg/dL (ref 1.7–2.4)

## 2023-07-03 LAB — BPAM RBC
Blood Product Expiration Date: 202504062359
ISSUE DATE / TIME: 202503081322
Unit Type and Rh: 600

## 2023-07-03 LAB — RAD ONC ARIA SESSION SUMMARY
Course Elapsed Days: 14
Plan Fractions Treated to Date: 10
Plan Prescribed Dose Per Fraction: 1.8 Gy
Plan Total Fractions Prescribed: 28
Plan Total Prescribed Dose: 50.4 Gy
Reference Point Dosage Given to Date: 18 Gy
Reference Point Session Dosage Given: 1.8 Gy
Session Number: 10

## 2023-07-03 LAB — TYPE AND SCREEN
ABO/RH(D): A NEG
Antibody Screen: NEGATIVE
Unit division: 0

## 2023-07-03 LAB — HEMOGLOBIN AND HEMATOCRIT, BLOOD
HCT: 23.7 % — ABNORMAL LOW (ref 39.0–52.0)
Hemoglobin: 7.3 g/dL — ABNORMAL LOW (ref 13.0–17.0)

## 2023-07-03 LAB — CBC WITH DIFFERENTIAL/PLATELET
Abs Immature Granulocytes: 0.04 10*3/uL (ref 0.00–0.07)
Basophils Absolute: 0 10*3/uL (ref 0.0–0.1)
Basophils Relative: 0 %
Eosinophils Absolute: 0.1 10*3/uL (ref 0.0–0.5)
Eosinophils Relative: 2 %
HCT: 23.1 % — ABNORMAL LOW (ref 39.0–52.0)
Hemoglobin: 7.2 g/dL — ABNORMAL LOW (ref 13.0–17.0)
Immature Granulocytes: 1 %
Lymphocytes Relative: 14 %
Lymphs Abs: 0.7 10*3/uL (ref 0.7–4.0)
MCH: 28.2 pg (ref 26.0–34.0)
MCHC: 31.2 g/dL (ref 30.0–36.0)
MCV: 90.6 fL (ref 80.0–100.0)
Monocytes Absolute: 0.4 10*3/uL (ref 0.1–1.0)
Monocytes Relative: 8 %
Neutro Abs: 3.9 10*3/uL (ref 1.7–7.7)
Neutrophils Relative %: 75 %
Platelets: 106 10*3/uL — ABNORMAL LOW (ref 150–400)
RBC: 2.55 MIL/uL — ABNORMAL LOW (ref 4.22–5.81)
RDW: 15.4 % (ref 11.5–15.5)
WBC: 5.3 10*3/uL (ref 4.0–10.5)
nRBC: 0 % (ref 0.0–0.2)

## 2023-07-03 LAB — PHOSPHORUS: Phosphorus: 4.2 mg/dL (ref 2.5–4.6)

## 2023-07-03 MED ORDER — ROSUVASTATIN CALCIUM 10 MG PO TABS: 10.0000 mg | ORAL_TABLET | Freq: Every day | ORAL | 0 refills | Status: DC

## 2023-07-03 MED ORDER — AMOXICILLIN-POT CLAVULANATE 500-125 MG PO TABS
1.0000 | ORAL_TABLET | Freq: Two times a day (BID) | ORAL | Status: DC
  Filled 2023-07-03: qty 1

## 2023-07-03 MED ORDER — AMOXICILLIN-POT CLAVULANATE 500-125 MG PO TABS
1.0000 | ORAL_TABLET | Freq: Two times a day (BID) | ORAL | 0 refills | Status: AC
Start: 2023-07-03 — End: 2023-07-15

## 2023-07-03 MED ORDER — HEPARIN SOD (PORK) LOCK FLUSH 100 UNIT/ML IV SOLN
500.0000 [IU] | INTRAVENOUS | Status: AC | PRN
  Administered 2023-07-03: 500 [IU]

## 2023-07-03 MED ORDER — FERROUS SULFATE 325 (65 FE) MG PO TBEC: 325.0000 mg | DELAYED_RELEASE_TABLET | ORAL | 1 refills | Status: DC

## 2023-07-03 MED ORDER — PANTOPRAZOLE SODIUM 40 MG PO TBEC: 40.0000 mg | DELAYED_RELEASE_TABLET | Freq: Every day | ORAL | 1 refills | Status: DC

## 2023-07-03 NOTE — Progress Notes (Signed)
 Mobility Specialist - Progress Note   07/03/23 1522  Mobility  Activity Ambulated with assistance in hallway  Level of Assistance Modified independent, requires aide device or extra time  Assistive Device Front wheel walker  Distance Ambulated (ft) 500 ft  Activity Response Tolerated well  Mobility Referral Yes  Mobility visit 1 Mobility  Mobility Specialist Start Time (ACUTE ONLY) 1509  Mobility Specialist Stop Time (ACUTE ONLY) 1521  Mobility Specialist Time Calculation (min) (ACUTE ONLY) 12 min   Pt received in bed and agreeable to mobility. No complaints during session. Pt to EOB after session with all needs met.    [facility]

## 2023-07-03 NOTE — Plan of Care (Signed)

## 2023-07-03 NOTE — Discharge Summary (Signed)
 Physician Discharge Summary  Luis [name] ZOX:[id number] DOB: 1955-08-13 DOA: 06/26/2023  PCP: Nelwyn Salisbury, MD  Admit date: 06/26/2023 Discharge date: 07/03/2023  Time spent: 40 minutes  Recommendations for Outpatient Follow-up:  Follow outpatient CBC/CMP  Follow renal function outpatient, attention to Hb outpatient and platelets Abx dosed per renal function - adjust as needed Holding lasix at discharge due to aki - follow for resumption Holding potassium at discharge due to AKI follow for resumption Jardiance has been d/c'd in setting of aki and UTI Crestor reduced in setting of aki  Follow with IR and urology Follow with renal Follow with GI  Follow with oncology  Discharge Diagnoses:  Principal Problem:   SIRS (systemic inflammatory response syndrome) (HCC) Active Problems:   Acute on chronic anemia   Renal dysfunction   CAD (coronary artery disease)   Hyperlipidemia   OSA (obstructive sleep apnea)   Chronic diastolic CHF (congestive heart failure) (HCC)   Essential hypertension   Bladder cancer (HCC)   History of aortic valve replacement with tissue graft   Discharge Condition: stable  Diet recommendation: heart healthy   Filed Weights   06/27/23 0130 06/28/23 1316  Weight: 112 kg 112 kg    History of present illness:   68 year old male with multiple medical issues with Dayannara Pascal history of high-grade bladder cancer status post cystoprostatectomy with urostomy in place, currently undergoing radiation therapy started Hajime Asfaw week prior to admission.   He presented with fever (pertinent history of septic shock due to enterococcus bacteremia in Dec 2024) and severe anemia.    For his anemia he underwent EGD notable for nonbleeding gastric ulcers.  Started on PPI.  He received 3 units pRBC and his Hb is stable at the time of discharge.   He was found to have klebsiella bacteremia due to suspected UTI.  Now improved after abx and perc nephrostomy tube placement in the setting  of mild bilateral hydroureteronephrosis and AKI.   Creatinine is improving at the time of discharge, but not back to baseline.  Stable for discharge 3/10.    See below for additional details.  Hospital Course:  Assessment and Plan:  Sepsis due to Klebsiella Bacteremia Presented with fever, leukocytosis, and now with klebsiella bacteremia CT abdomen/pelvis with mild bilateral hydroureteronephrosis (cystectomy with ileal conduit placement and prostatectomy) UA could be c/w with UTI, but not overwhelmingly so -> urine culture multiple species Most likely source would seem to be urinary Rigors 3/8 after procedure, follow repeat blood cultures NGx2 Will discuss with ID given his recurrent bacteremia -> recommending 2 weeks augmentin (would start from 3/8 after procedure), follow up outpatient with ID   Acute kidney injury on CKD stage IV-baseline creatinine between 2 and 2.3, on admission it was 2.7 in the setting of febrile illness.  Creatinine worse today to 4.2 - what looks like post obstructive diuresis with 2.9 L out yesterday Will trend, mild hydro on imaging IVF will increase Renal consult Appreciate urology assistance -> s/p bilateral nephrostomy tubes   Rhinovirus Infection Seems mild, follow   Anemia, concern for melena -patient reports several weeks of intermittent melena, currently he is without frank bleeding and fecal occult is negative.   -Gastroenterology consulted, appreciate input - > s/p EGD with nonbleeding gastric ulcers without stigmata of bleeding (biopsied), continued PPI daily - follow pathology (gastric antral mucosa with nonspecific reactive gastropathy, negative for intestinal metaplasia, dysplasia, or malignancy - H pylori organisms are not identified on routine HE stain) Transfuse as needed or  for Hb <7 S/p 3 units pRBC, will discharge with iron   High-grade bladder cancer-status post total cystectomy with ileal conduit as well as prostatectomy-follows with  oncology Dr. [name], recently started radiation therapy Nava Song week prior to this admission and has completed 6 cycles. Follow outpatient   Hyponatremia - wnl   Chronic diastolic CHF-continue Coreg, hold Lasix, reevaluate with improvement in renal function   Hyperlipidemia-continue Crestor (dose reduced, reevaluate outpatient)   Essential hypertension-continue amlodipine and carvedilol, hold Lasix with aki, reevaluate outpatient   CAD-with history of PCI to RCA, stable, denies chest pain.  Resume aspirin.  Continue statin (dose adjustment for renal function)   OSA on CPAP-continue CPAP   Status post bioprosthetic AVR, ascending aorta replacement  noted  Obesity Body mass index is 39.85 kg/m.      Procedures:  EGD  Bilateral nephrostomy tubes  Consultations: Urology IR GI renal  Discharge Exam: Vitals:   07/03/23 1022 07/03/23 1622  BP: 111/68 111/68  Pulse:  64  Resp:    Temp:    SpO2:     See progress note  Discharge Instructions   Discharge Instructions     Call MD for:  difficulty breathing, headache or visual disturbances   Complete by: As directed    Call MD for:  extreme fatigue   Complete by: As directed    Call MD for:  hives   Complete by: As directed    Call MD for:  persistant dizziness or light-headedness   Complete by: As directed    Call MD for:  persistant nausea and vomiting   Complete by: As directed    Call MD for:  redness, tenderness, or signs of infection (pain, swelling, redness, odor or green/yellow discharge around incision site)   Complete by: As directed    Call MD for:  severe uncontrolled pain   Complete by: As directed    Call MD for:  temperature >100.4   Complete by: As directed    Diet - low sodium heart healthy   Complete by: As directed    Discharge instructions   Complete by: As directed    You were seen with bacteremia (bacteria in the blood) that was most likely related to Aireanna Luellen urine infection.  You've been  started on antibiotics.  We'll discharge you with another 12 days of antibiotics.  These are dosed based on your renal function.  Your dose may need to be adjusted if your renal function improves.  Ask your PCP about follow up labs this week.    You had acute kidney injury related to hydronephrosis.  Your kidney function is improving with the nephrostomy tubes.  Follow up with interventional radiology and urology as an outpatient.  Follow up with the kidney doctors as recommended.   Your blood counts are stable at the time of discharge, but low.  We'll send you with iron to take every other day.  You had [name] procedure with GI which showed non bleeding ulcers.  Continue protonix 40 mg daily.  The surgical pathology shows reactive gastropathy.  Follow these results with gastroenterology outpatient.  Ask your PCP to repeat Atoya Andrew CBC (blood counts) within the week to ensure your hemoglobin (red blood cells) and platelets (blood cells that help clot) are stable.  Return for new recurrent or worsening symptoms.  Please ask your PCP to request records from this hospitalization so they know what was done and what the next steps will be.   Discharge wound care:  Complete by: As directed    Per interventional radiology   Increase activity slowly   Complete by: As directed       Allergies as of 07/03/2023   No Known Allergies      Medication List     PAUSE taking these medications    furosemide 20 MG tablet Wait to take this until your doctor or other care provider tells you to start again. Follow with your PCP for further instructions on when to resume this. Commonly known as: LASIX Take 1 tablet (20 mg total) by mouth daily.   potassium chloride SA 20 MEQ tablet Wait to take this until your doctor or other care provider tells you to start again. Commonly known as: Klor-Con M20 Take 1 tablet (20 mEq total) by mouth daily.       STOP taking these medications    Jardiance 10 MG Tabs  tablet Generic drug: empagliflozin       TAKE these medications    amLODipine 5 MG tablet Commonly known as: NORVASC Take 1 tablet (5 mg total) by mouth daily.   amoxicillin-clavulanate 500-125 MG tablet Commonly known as: AUGMENTIN Take 1 tablet by mouth 2 (two) times daily for 12 days. Repeat labs with your PCP sometime within the next week.  If your kidney function improves, this dose may be adjusted.   aspirin EC 81 MG tablet Take 81 mg by mouth in the morning.   carvedilol 3.125 MG tablet Commonly known as: COREG TAKE 1 TABLET BY MOUTH TWICE Charlei Ramsaran DAY WITH FOOD   cyanocobalamin 1000 MCG tablet Commonly known as: VITAMIN B12 Take 1 tablet (1,000 mcg total) by mouth daily.   diazepam 5 MG tablet Commonly known as: VALIUM TAKE 1 TABLET BY MOUTH EVERY 12 HOURS AS NEEDED FOR ANXIETY.   ferrous sulfate 325 (65 FE) MG EC tablet Take 1 tablet (325 mg total) by mouth every other day.   ipratropium 0.03 % nasal spray Commonly known as: ATROVENT Place 2 sprays into both nostrils every 12 (twelve) hours as needed for rhinitis.   lidocaine-prilocaine cream Commonly known as: EMLA Apply 1 Application topically as needed.   Magnesium 500 MG Tabs Take 500 mg by mouth in the morning.   meclizine 25 MG tablet Commonly known as: ANTIVERT Take 1 tablet (25 mg total) by mouth 3 (three) times daily as needed for dizziness.   multivitamin-iron-minerals-folic acid chewable tablet Chew 1 tablet by mouth daily.   nitroGLYCERIN 0.4 MG SL tablet Commonly known as: NITROSTAT Place 0.4 mg under the tongue every 5 (five) minutes as needed for chest pain.   ondansetron 8 MG tablet Commonly known as: ZOFRAN Take 1 tablet (8 mg total) by mouth every 8 (eight) hours as needed.   pantoprazole 40 MG tablet Commonly known as: PROTONIX Take 1 tablet (40 mg total) by mouth daily. Start taking on: July 04, 2023   prochlorperazine 10 MG tablet Commonly known as: COMPAZINE Take 1 tablet (10  mg total) by mouth every 6 (six) hours as needed for nausea or vomiting.   rosuvastatin 10 MG tablet Commonly known as: CRESTOR Take 1 tablet (10 mg total) by mouth at bedtime. Reduce your dose of crestor to 10 mg daily.  Follow with your PCP outpatient to follow your kidney function to determine if this dose reduction should be long term. What changed:  medication strength See the new instructions.   Tylenol 325 MG tablet Generic drug: acetaminophen Take 650 mg by mouth every 6 (six)  hours as needed (for pain).   venlafaxine XR 150 MG 24 hr capsule Commonly known as: EFFEXOR-XR TAKE 1 CAPSULE BY MOUTH DAILY WITH BREAKFAST.               Discharge Care Instructions  (From admission, onward)           Start     Ordered   07/03/23 0000  Discharge wound care:       Comments: Per interventional radiology   07/03/23 1740           No Known Allergies  Follow-up Information     Nelwyn Salisbury, MD Follow up.   Specialty: Family Medicine Why: call for Cresencia Asmus hospital follow up appointment and repeat labs Contact information: [address] [place] [address] [phone]         [facility] Follow up.   Why: follow up as recommended Contact information: [address] [place] [address] [phone]         Loletta Parish., MD Follow up.   Specialty: Urology Contact information: [address] [place] [address] [phone]         Lynann Bologna, MD Follow up.   Specialties: Gastroenterology, Internal Medicine Why: call for follow up of endoscopy Contact information: [address] [place] [address] [phone]                  The results of significant diagnostics from this hospitalization (including imaging, microbiology, ancillary and laboratory) are listed below for reference.    Significant Diagnostic Studies: CT ABDOMEN PELVIS WO CONTRAST Result Date: 06/27/2023 CLINICAL DATA:  Fever.  EXAM: CT ABDOMEN AND PELVIS WITHOUT CONTRAST TECHNIQUE: Multidetector CT imaging of the abdomen and pelvis was performed following the standard protocol without IV contrast. RADIATION DOSE REDUCTION: This exam was performed according to the departmental dose-optimization program which includes automated exposure control, adjustment of the mA and/or kV according to patient size and/or use of iterative reconstruction technique. COMPARISON:  April 13, 2023. FINDINGS: Lower chest: No acute abnormality. Hepatobiliary: No focal liver abnormality is seen. Status post cholecystectomy. No biliary dilatation. Pancreas: Unremarkable. No pancreatic ductal dilatation or surrounding inflammatory changes. Spleen: Normal in size without focal abnormality. Adrenals/Urinary Tract: Adrenal glands appear normal. Patient is status post cystectomy with ileal conduit placement. Mild bilateral hydroureteronephrosis is noted without obstructing calculus. Stable left renal cysts are noted. Stomach/Bowel: The stomach is unremarkable. There is no evidence of bowel obstruction or inflammation. Vascular/Lymphatic: Aortic atherosclerosis. No enlarged abdominal or pelvic lymph nodes. Reproductive: Status post prostatectomy. Other: No ascites or hernia is noted. Musculoskeletal: No acute or significant osseous findings. IMPRESSION: Status post cystectomy with ileal conduit placement, as well as prostatectomy. Mild bilateral hydroureteronephrosis is noted without obstructing calculus. Aortic Atherosclerosis (ICD10-I70.0). Electronically Signed   By: Lupita Raider M.D.   On: 06/27/2023 15:28   DG Chest Port 1 View Result Date: 06/27/2023 CLINICAL DATA:  Fevers EXAM: PORTABLE CHEST 1 VIEW COMPARISON:  05/16/2023 CT FINDINGS: Cardiac shadow is within normal limits. Lungs are well aerated bilaterally. Postsurgical changes are seen. Right chest wall port is noted in satisfactory position. No bony abnormality is noted. IMPRESSION: No active  disease. Electronically Signed   By: Alcide Clever M.D.   On: 06/27/2023 00:06   CARDIAC CATHETERIZATION Result Date: 06/15/2023 1. Normal PCWP, elevated RA pressure.  R>L heart failure. 2. Normal (high) CO 3. Preserved PAPi Start Lasix 20 mg po daily.  Microbiology: Recent Results (from the past 240 hours)  Resp panel by RT-PCR (RSV, Flu Masami Plata&B, Covid)     Status: None   Collection Time: 06/26/23  9:00 PM   Specimen: Nasal Swab  Result Value Ref Range Status   SARS Coronavirus 2 by RT PCR NEGATIVE NEGATIVE Final    Comment: (NOTE) SARS-CoV-2 target nucleic acids are NOT DETECTED.  The SARS-CoV-2 RNA is generally detectable in upper respiratory specimens during the acute phase of infection. The lowest concentration of SARS-CoV-2 viral copies this assay can detect is 138 copies/mL. Zarra Geffert negative result does not preclude SARS-Cov-2 infection and should not be used as the sole basis for treatment or other patient management decisions. Launa Goedken negative result may occur with  improper specimen collection/handling, submission of specimen other than nasopharyngeal swab, presence of viral mutation(s) within the areas targeted by this assay, and inadequate number of viral copies(<138 copies/mL). Jimmy Stipes negative result must be combined with clinical observations, patient history, and epidemiological information. The expected result is Negative.  Fact Sheet for Patients:  BloggerCourse.com  Fact Sheet for Healthcare Providers:  SeriousBroker.it  This test is no t yet approved or cleared by the [place] FDA and  has been authorized for detection and/or diagnosis of SARS-CoV-2 by FDA under an Emergency Use Authorization (EUA). This EUA will remain  in effect (meaning this test can be used) for the duration of the COVID-19 declaration under Section 564(b)(1) of the Act, 21 U.S.C.section 360bbb-3(b)(1), unless the authorization is terminated  or revoked  sooner.       Influenza Jauna Raczynski by PCR NEGATIVE NEGATIVE Final   Influenza B by PCR NEGATIVE NEGATIVE Final    Comment: (NOTE) The Xpert Xpress SARS-CoV-2/FLU/RSV plus assay is intended as an aid in the diagnosis of influenza from Nasopharyngeal swab specimens and should not be used as Maurine Mowbray sole basis for treatment. Nasal washings and aspirates are unacceptable for Xpert Xpress SARS-CoV-2/FLU/RSV testing.  Fact Sheet for Patients: BloggerCourse.com  Fact Sheet for Healthcare Providers: SeriousBroker.it  This test is not yet approved or cleared by the [place] FDA and has been authorized for detection and/or diagnosis of SARS-CoV-2 by FDA under an Emergency Use Authorization (EUA). This EUA will remain in effect (meaning this test can be used) for the duration of the COVID-19 declaration under Section 564(b)(1) of the Act, 21 U.S.C. section 360bbb-3(b)(1), unless the authorization is terminated or revoked.     Resp Syncytial Virus by PCR NEGATIVE NEGATIVE Final    Comment: (NOTE) Fact Sheet for Patients: BloggerCourse.com  Fact Sheet for Healthcare Providers: SeriousBroker.it  This test is not yet approved or cleared by the [place] FDA and has been authorized for detection and/or diagnosis of SARS-CoV-2 by FDA under an Emergency Use Authorization (EUA). This EUA will remain in effect (meaning this test can be used) for the duration of the COVID-19 declaration under Section 564(b)(1) of the Act, 21 U.S.C. section 360bbb-3(b)(1), unless the authorization is terminated or revoked.  Performed at [facility], [address], [place], [place] [address]   Blood Culture (routine x 2)     Status: Abnormal   Collection Time: 06/26/23  9:00 PM   Specimen: BLOOD  Result Value Ref Range Status   Specimen Description   Final    BLOOD BLOOD RIGHT HAND Performed  at [facility], [address], [place], [place] [address]    Special Requests   Final    BOTTLES DRAWN AEROBIC AND ANAEROBIC Blood Culture  results may not be optimal due to an inadequate volume of blood received in culture bottles Performed at [facility], [address], [place], [place] [address]    Culture  Setup Time   Final    GRAM NEGATIVE RODS ANAEROBIC BOTTLE ONLY CRITICAL RESULT CALLED TO, READ BACK BY AND VERIFIED WITH: Roni Bread [id number] @ 1549 FH Performed at [facility], [address] [place], [place] [address]    Culture KLEBSIELLA OXYTOCA (Paloma Grange)  Final   Report Status 06/29/2023 FINAL  Final   Organism ID, Bacteria KLEBSIELLA OXYTOCA  Final      Susceptibility   Klebsiella oxytoca - MIC*    AMPICILLIN RESISTANT Resistant     CEFEPIME <=0.12 SENSITIVE Sensitive     CEFTAZIDIME <=1 SENSITIVE Sensitive     CEFTRIAXONE <=0.25 SENSITIVE Sensitive     CIPROFLOXACIN <=0.25 SENSITIVE Sensitive     GENTAMICIN <=1 SENSITIVE Sensitive     IMIPENEM 0.5 SENSITIVE Sensitive     TRIMETH/SULFA <=20 SENSITIVE Sensitive     AMPICILLIN/SULBACTAM 4 SENSITIVE Sensitive     PIP/TAZO <=4 SENSITIVE Sensitive ug/mL    * KLEBSIELLA OXYTOCA  Blood Culture (routine x 2)     Status: None   Collection Time: 06/26/23  9:00 PM   Specimen: BLOOD LEFT HAND  Result Value Ref Range Status   Specimen Description   Final    BLOOD LEFT HAND Performed at [facility], [address] [place], [place] [address]    Special Requests   Final    BOTTLES DRAWN AEROBIC AND ANAEROBIC Blood Culture results may not be optimal due to an inadequate volume of blood received in culture bottles Performed at [facility], [address], [place], [place] [address]    Culture   Final    NO GROWTH 5 DAYS Performed at [facility], [address] [place], [place] [address]    Report Status 07/01/2023 FINAL  Final  Blood Culture ID  Panel (Reflexed)     Status: Abnormal   Collection Time: 06/26/23  9:00 PM  Result Value Ref Range Status   Enterococcus faecalis NOT DETECTED NOT DETECTED Final   Enterococcus Faecium NOT DETECTED NOT DETECTED Final   Listeria monocytogenes NOT DETECTED NOT DETECTED Final   Staphylococcus species NOT DETECTED NOT DETECTED Final   Staphylococcus aureus (BCID) NOT DETECTED NOT DETECTED Final   Staphylococcus epidermidis NOT DETECTED NOT DETECTED Final   Staphylococcus lugdunensis NOT DETECTED NOT DETECTED Final   Streptococcus species NOT DETECTED NOT DETECTED Final   Streptococcus agalactiae NOT DETECTED NOT DETECTED Final   Streptococcus pneumoniae NOT DETECTED NOT DETECTED Final   Streptococcus pyogenes NOT DETECTED NOT DETECTED Final   Robinn Overholt.calcoaceticus-baumannii NOT DETECTED NOT DETECTED Final   Bacteroides fragilis NOT DETECTED NOT DETECTED Final   Enterobacterales DETECTED (Hurshel Bouillon) NOT DETECTED Final    Comment: Enterobacterales represent Yosgart Pavey large order of gram negative bacteria, not Prisila Dlouhy single organism. CRITICAL RESULT CALLED TO, READ BACK BY AND VERIFIED WITH: PHARMD D. WOFFORD [id number] @ 1549 FH    Enterobacter cloacae complex NOT DETECTED NOT DETECTED Final   Escherichia coli NOT DETECTED NOT DETECTED Final   Klebsiella aerogenes NOT DETECTED NOT DETECTED Final   Klebsiella oxytoca DETECTED (Kerrigan Glendening) NOT DETECTED Final    Comment: CRITICAL RESULT CALLED TO, READ BACK BY AND VERIFIED WITH: PHARMD D. WOFFORD [id number] @ 1549 FH    Klebsiella pneumoniae NOT DETECTED NOT DETECTED Final   Proteus  species NOT DETECTED NOT DETECTED Final   Salmonella species NOT DETECTED NOT DETECTED Final   Serratia marcescens NOT DETECTED NOT DETECTED Final   Haemophilus influenzae NOT DETECTED NOT DETECTED Final   Neisseria meningitidis NOT DETECTED NOT DETECTED Final   Pseudomonas aeruginosa NOT DETECTED NOT DETECTED Final   Stenotrophomonas maltophilia NOT DETECTED NOT DETECTED Final   Candida albicans NOT  DETECTED NOT DETECTED Final   Candida auris NOT DETECTED NOT DETECTED Final   Candida glabrata NOT DETECTED NOT DETECTED Final   Candida krusei NOT DETECTED NOT DETECTED Final   Candida parapsilosis NOT DETECTED NOT DETECTED Final   Candida tropicalis NOT DETECTED NOT DETECTED Final   Cryptococcus neoformans/gattii NOT DETECTED NOT DETECTED Final   CTX-M ESBL NOT DETECTED NOT DETECTED Final   Carbapenem resistance IMP NOT DETECTED NOT DETECTED Final   Carbapenem resistance KPC NOT DETECTED NOT DETECTED Final   Carbapenem resistance NDM NOT DETECTED NOT DETECTED Final   Carbapenem resist OXA 48 LIKE NOT DETECTED NOT DETECTED Final   Carbapenem resistance VIM NOT DETECTED NOT DETECTED Final    Comment: Performed at [facility], [address] [place], [place] [address]  Urine Culture     Status: Abnormal   Collection Time: 06/27/23 12:24 AM   Specimen: Urine, Random  Result Value Ref Range Status   Specimen Description   Final    URINE, RANDOM Performed at [facility], [address], [place], [place] [address]    Special Requests   Final    NONE Reflexed from [id number] Performed at [facility], [address] [place], [place] [address]    Culture MULTIPLE SPECIES PRESENT, SUGGEST RECOLLECTION (Gautam Langhorst)  Final   Report Status 06/28/2023 FINAL  Final  Respiratory (~20 pathogens) panel by PCR     Status: Abnormal   Collection Time: 06/27/23  6:28 AM   Specimen: Nasopharyngeal Swab; Respiratory  Result Value Ref Range Status   Adenovirus NOT DETECTED NOT DETECTED Final   Coronavirus 229E NOT DETECTED NOT DETECTED Final    Comment: (NOTE) The Coronavirus on the Respiratory Panel, DOES NOT test for the novel  Coronavirus (2019 nCoV)    Coronavirus HKU1 NOT DETECTED NOT DETECTED Final   Coronavirus NL63 NOT DETECTED NOT DETECTED Final   Coronavirus OC43 NOT DETECTED NOT DETECTED Final   Metapneumovirus NOT DETECTED NOT DETECTED Final    Rhinovirus / Enterovirus DETECTED (Dwain Huhn) NOT DETECTED Final   Influenza Nameer Summer NOT DETECTED NOT DETECTED Final   Influenza B NOT DETECTED NOT DETECTED Final   Parainfluenza Virus 1 NOT DETECTED NOT DETECTED Final   Parainfluenza Virus 2 NOT DETECTED NOT DETECTED Final   Parainfluenza Virus 3 NOT DETECTED NOT DETECTED Final   Parainfluenza Virus 4 NOT DETECTED NOT DETECTED Final   Respiratory Syncytial Virus NOT DETECTED NOT DETECTED Final   Bordetella pertussis NOT DETECTED NOT DETECTED Final   Bordetella Parapertussis NOT DETECTED NOT DETECTED Final   Chlamydophila pneumoniae NOT DETECTED NOT DETECTED Final   Mycoplasma pneumoniae NOT DETECTED NOT DETECTED Final    Comment: Performed at [facility], [address], [place], [place] [address]  Aerobic/Anaerobic Culture w Gram Stain (surgical/deep wound)     Status: None (Preliminary result)   Collection Time: 07/01/23  4:21 PM   Specimen: Kidney; Urine  Result Value Ref Range Status   Specimen Description   Final    KIDNEY LEFT Performed at [facility], [address], [place], [place]  [address]    Special Requests   Final    NONE Performed at [facility], [address], [place], [place] [address]    Gram Stain   Final    NO WBC SEEN RARE GRAM NEGATIVE RODS Performed at [facility], [address], [place], [place] [address]    Culture   Final    RARE ENTEROCOCCUS FAECALIS CULTURE REINCUBATED FOR BETTER GROWTH NO ANAEROBES ISOLATED; CULTURE IN PROGRESS FOR 5 DAYS    Report Status PENDING  Incomplete  Aerobic/Anaerobic Culture w Gram Stain (surgical/deep wound)     Status: None (Preliminary result)   Collection Time: 07/01/23  4:21 PM   Specimen: Kidney  Result Value Ref Range Status   Specimen Description   Final    KIDNEY RIGHT Performed at [facility], [address] [place], [place] [address]    Special Requests   Final    NONE Performed at [facility], [address], [place], [place] [address]    Gram Stain NO WBC SEEN NO ORGANISMS SEEN   Final   Culture   Final    NO GROWTH 2 DAYS NO ANAEROBES ISOLATED; CULTURE IN PROGRESS FOR 5 DAYS Performed at [facility], [address], [place], [place] [address]    Report Status PENDING  Incomplete  Culture, blood (Routine X 2) w Reflex to ID Panel     Status: None (Preliminary result)   Collection Time: 07/01/23  6:21 PM   Specimen: BLOOD  Result Value Ref Range Status   Specimen Description   Final    BLOOD BLOOD RIGHT HAND AEROBIC BOTTLE ONLY Performed at [facility], [address] [place], [place] [address]    Special Requests   Final    Blood Culture adequate volume Performed at [facility], [address], [place], [place] [address]    Culture   Final    NO GROWTH 2 DAYS Performed at [facility], [address], [place], [place] [address]    Report Status PENDING  Incomplete  Culture, blood (Routine X 2) w Reflex to ID Panel     Status: None (Preliminary result)   Collection Time: 07/01/23  6:21 PM   Specimen: BLOOD  Result Value Ref Range Status   Specimen Description   Final    BLOOD BLOOD RIGHT HAND AEROBIC BOTTLE ONLY Performed at [facility], [address], [place], [place] [address]    Special Requests   Final    Blood Culture adequate volume Performed at [facility], [address] [place], [place] [address]    Culture   Final    NO GROWTH 2 DAYS Performed at [facility], [address], [place], [place] [address]    Report Status PENDING  Incomplete     Labs: Basic Metabolic Panel: Recent Labs  Lab 06/29/23 0423 06/30/23 0349 07/01/23 0356 07/02/23 0329 07/03/23 0350  NA 136 137 134* 134* 135  K 3.9 4.0 3.9 4.9 4.1  CL 106 105 106 107 108  CO2 19* 21* 18* 18* 20*  GLUCOSE 117* 108* 106* 134* 120*  BUN 39* 41* 39* 39* 36*  CREATININE 3.01* 3.25* 3.80*  4.27* 3.51*  CALCIUM 8.2* 7.8* 7.8* 7.9* 7.7*  MG 2.2 2.2 2.0 1.8 1.8  PHOS 4.8* 5.1* 5.5* 5.6* 4.2   Liver Function Tests: Recent Labs  Lab 06/27/23 1105 06/28/23 0500  06/29/23 0423 06/30/23 0349 07/02/23 0329  AST 29 30 57* 48* 27  ALT 29 37 59* 59* 44  ALKPHOS 100 105 159* 153* 146*  BILITOT 1.2 0.7 0.5 0.9 0.7  PROT 7.4 7.4 7.3 7.2 6.9  ALBUMIN 2.5* 2.4* 2.4* 2.3* 2.4*   No results for input(s): "LIPASE", "AMYLASE" in the last 168 hours. No results for input(s): "AMMONIA" in the last 168 hours. CBC: Recent Labs  Lab 06/26/23 2100 06/27/23 1105 06/29/23 0423 06/30/23 0349 07/01/23 0356 07/02/23 0329 07/03/23 0350 07/03/23 1707  WBC 12.3*   < > 5.6 5.3 5.3 9.7 5.3  --   NEUTROABS 10.9*  --  4.1 3.7  --  8.7* 3.9  --   HGB 5.8*   < > 7.6* 7.6* 7.5* 8.3* 7.2* 7.3*  HCT 18.0*   < > 24.2* 24.3* 23.8* 26.6* 23.1* 23.7*  MCV 89.1   < > 89.6 89.3 89.5 91.4 90.6  --   PLT 252   < > 159 141* 126* 121* 106*  --    < > = values in this interval not displayed.   Cardiac Enzymes: No results for input(s): "CKTOTAL", "CKMB", "CKMBINDEX", "TROPONINI" in the last 168 hours. BNP: BNP (last 3 results) Recent Labs    02/28/23 1514 05/24/23 1430 06/20/23 1012  BNP 38.4 56.5 96.0    ProBNP (last 3 results) No results for input(s): "PROBNP" in the last 8760 hours.  CBG: No results for input(s): "GLUCAP" in the last 168 hours.     Signed:  Lacretia Nicks MD.  [facility] 07/03/2023, 5:52 PM

## 2023-07-03 NOTE — Plan of Care (Addendum)
 VSS. Pain managed with scheduled Tylenol. Left nephrostomy bag with clots resulting in difficulty draining; flushed with NS with improvement. No acute events overnight.  Problem: Fluid Volume: Goal: Hemodynamic stability will improve Outcome: Progressing   Problem: Clinical Measurements: Goal: Signs and symptoms of infection will decrease Outcome: Progressing   Problem: Education: Goal: Knowledge of General Education information will improve Description: Including pain rating scale, medication(s)/side effects and non-pharmacologic comfort measures Outcome: Progressing   Problem: Clinical Measurements: Goal: Will remain free from infection Outcome: Progressing   Problem: Activity: Goal: Risk for activity intolerance will decrease Outcome: Progressing   Problem: Pain Managment: Goal: General experience of comfort will improve and/or be controlled Outcome: Progressing   Problem: Safety: Goal: Ability to remain free from injury will improve Outcome: Progressing   Problem: Skin Integrity: Goal: Risk for impaired skin integrity will decrease Outcome: Progressing

## 2023-07-03 NOTE — Progress Notes (Signed)
 Nephrology Follow-Up Consult note   Assessment/Recommendations: Luis [name] is a/an 68 y.o. male with a past medical history significant for CAD, HTN, bladder cancer, admitted for obstruction and AKI.       AKI on CKD3b: BL crt around 2. Crt improving after obstruction relieved. Likely will cont to improve to baseline. We will sign off. He was supposed to have a new patient visit this week but will reschedule.  Klebsiella bacteremia: abx per primary team  Obstruction: followed by urology s/p bilateral PCN  Bladder cancer: s/p cystectomy and prostatectomy w/ ileal conduit   Recommendations conveyed to primary service.    Darnell Level [facility] 07/03/2023 11:51 AM  ___________________________________________________________  CC: hydronephrosis  Interval History/Subjective: Patient feels well. UOP improved. Crt decreasing   Medications:  Current Facility-Administered Medications  Medication Dose Route Frequency Provider Last Rate Last Admin   0.9 %  sodium chloride infusion (Manually program via Guardrails IV Fluids)   Intravenous Once Derwood Kaplan, MD   Held at 06/27/23 0309   acetaminophen (TYLENOL) tablet 1,000 mg  1,000 mg Oral Q8H Zigmund Daniel., MD   1,000 mg at 07/02/23 2229   Followed by   Melene Muller ON 07/05/2023] acetaminophen (TYLENOL) tablet 650 mg  650 mg Oral Q6H PRN Zigmund Daniel., MD       amLODipine (NORVASC) tablet 5 mg  5 mg Oral Daily Darreld Mclean R, MD   5 mg at 07/03/23 1022   Ampicillin-Sulbactam (UNASYN) 3 g in sodium chloride 0.9 % 100 mL IVPB  3 g Intravenous Q12H Pham, Anh P, RPH 200 mL/hr at 07/03/23 0836 3 g at 07/03/23 0836   carvedilol (COREG) tablet 3.125 mg  3.125 mg Oral BID WC Darreld Mclean R, MD   3.125 mg at 07/03/23 2956   Chlorhexidine Gluconate Cloth 2 % PADS 6 each  6 each Topical Daily Leatha Gilding, MD   6 each at 07/02/23 1049   diazepam (VALIUM) tablet 5 mg  5 mg Oral Q12H PRN Darreld Mclean  R, MD   5 mg at 07/01/23 2043   HYDROmorphone (DILAUDID) injection 0.5 mg  0.5 mg Intravenous Q3H PRN Zigmund Daniel., MD   0.5 mg at 07/02/23 0150   ondansetron (ZOFRAN) tablet 4 mg  4 mg Oral Q6H PRN Charlsie Quest, MD       Or   ondansetron (ZOFRAN) injection 4 mg  4 mg Intravenous Q6H PRN Darreld Mclean R, MD   4 mg at 07/02/23 0830   oxyCODONE (Oxy IR/ROXICODONE) immediate release tablet 5 mg  5 mg Oral Q6H PRN Zigmund Daniel., MD       pantoprazole (PROTONIX) EC tablet 40 mg  40 mg Oral Daily Zigmund Daniel., MD   40 mg at 07/03/23 1022   potassium chloride SA (KLOR-CON M) CR tablet 20 mEq  20 mEq Oral Daily Darreld Mclean R, MD   20 mEq at 07/03/23 1022   rosuvastatin (CRESTOR) tablet 10 mg  10 mg Oral QHS Zigmund Daniel., MD   10 mg at 07/02/23 2229   senna-docusate (Senokot-S) tablet 1 tablet  1 tablet Oral QHS PRN Darreld Mclean R, MD       sodium chloride flush (NS) 0.9 % injection 10-40 mL  10-40 mL Intracatheter Q12H Gherghe, Costin M, MD   20 mL at 07/03/23 0844   sodium chloride flush (NS) 0.9 % injection 10-40 mL  10-40 mL Intracatheter PRN Gherghe,  Daylene Katayama, MD       sodium chloride flush (NS) 0.9 % injection 3 mL  3 mL Intravenous Q12H Darreld Mclean R, MD   3 mL at 07/03/23 0841   venlafaxine XR (EFFEXOR-XR) 24 hr capsule 150 mg  150 mg Oral Q breakfast Charlsie Quest, MD   150 mg at 07/03/23 0840      Review of Systems: 10 systems reviewed and negative except per interval history/subjective  Physical Exam: Vitals:   07/03/23 0839 07/03/23 1022  BP: 111/68 111/68  Pulse: 64   Resp:    Temp:    SpO2:     Total I/O In: -  Out: 1425 [Urine:1425]  Intake/Output Summary (Last 24 hours) at 07/03/2023 1151 Last data filed at 07/03/2023 1027 Gross per 24 hour  Intake 701.98 ml  Output 5225 ml  Net -4523.02 ml   Constitutional: well-appearing, no acute distress ENMT: ears and nose without scars or lesions, MMM CV: normal rate, no  edema Respiratory: bilateral chest rise, normal work of breathing Gastrointestinal: soft, non-tender, no palpable masses or hernias Skin: no visible lesions or rashes Psych: alert, judgement/insight appropriate, appropriate mood and affect   Test Results I personally reviewed new and old clinical labs and radiology tests Lab Results  Component Value Date   NA 135 07/03/2023   K 4.1 07/03/2023   CL 108 07/03/2023   CO2 20 (L) 07/03/2023   BUN 36 (H) 07/03/2023   CREATININE 3.51 (H) 07/03/2023   GFR 85.71 11/26/2014   CALCIUM 7.7 (L) 07/03/2023   ALBUMIN 2.4 (L) 07/02/2023   PHOS 4.2 07/03/2023    CBC Recent Labs  Lab 06/30/23 0349 07/01/23 0356 07/02/23 0329 07/03/23 0350  WBC 5.3 5.3 9.7 5.3  NEUTROABS 3.7  --  8.7* 3.9  HGB 7.6* 7.5* 8.3* 7.2*  HCT 24.3* 23.8* 26.6* 23.1*  MCV 89.3 89.5 91.4 90.6  PLT 141* 126* 121* 106*

## 2023-07-03 NOTE — Progress Notes (Signed)
   5 Days Post-Op Subjective: NAEON. Pt up in chair on rounds with clear yellow urine in PCN bags. Reviewed case and plan with he and his wife.   Objective: Vital signs in last 24 hours: Temp:  [97.9 F (36.6 C)-98.8 F (37.1 C)] 98.8 F (37.1 C) (03/10 0429) Pulse Rate:  [51-64] 64 (03/10 0839) Resp:  [17-18] 17 (03/10 0429) BP: (103-111)/(58-68) 111/68 (03/10 0839) SpO2:  [99 %-100 %] 99 % (03/10 0429)  Assessment/Plan: #mild bilateral hydronephrosis  #AoCKD  No clear obstruction on available imaging. Can consider loopogram or other contract based option once patient has meaningful improvement in Scr.   Good UO-4726mL. Both PCNT's draining clear yellow urine. Small but significant volume still coming from ureterostomy.   Continue trending labs. No signs of postobstructive diuresis.   Will follow.  Intake/Output from previous day: 03/09 0701 - 03/10 0700 In: 702 [I.V.:492; IV Piggyback:200] Out: 4700 [Urine:4700]  Intake/Output this shift: Total I/O In: -  Out: 600 [Urine:600]  Physical Exam:  General: Alert and oriented CV: No cyanosis Lungs: equal chest rise Abdomen: Soft, NTND, no rebound or guarding Gu: Clear yellow urine in ureterostomy and bilateral PCNT's.  Lab Results: Recent Labs    07/01/23 0356 07/02/23 0329 07/03/23 0350  HGB 7.5* 8.3* 7.2*  HCT 23.8* 26.6* 23.1*   BMET Recent Labs    07/02/23 0329 07/03/23 0350  NA 134* 135  K 4.9 4.1  CL 107 108  CO2 18* 20*  GLUCOSE 134* 120*  BUN 39* 36*  CREATININE 4.27* 3.51*  CALCIUM 7.9* 7.7*     Studies/Results: No results found.    LOS: 6 days   Elmon Kirschner, NP [facility] Pager: [phone]  07/03/2023, 9:14 AM

## 2023-07-03 NOTE — Progress Notes (Signed)
 PROGRESS NOTE    Luis [name]  WUJ:[id number] DOB: Oct 10, 1955 DOA: 06/26/2023 PCP: Nelwyn Salisbury, MD  Chief Complaint  Patient presents with   Fever    Brief Narrative:   68 year old male with history of high-grade bladder cancer status post cystoprostatectomy with urostomy in place, currently undergoing radiation therapy started Luis [name] week prior to admission, chronic diastolic CHF, CAD with PCI to RCA, severe AAS status post bioprosthetic AVR and ascending aorta replacement, HTN, HLD, OSA on CPAP who comes into the hospital with fevers over the last 3 to 4 days.  Patient has Luis [name] recent history of septic shock due to Enterococcus bacteremia in December 2024, completed several weeks of IV ampicillin, and at that time there was no evidence of TEE and no infection of his aortic graft on the CT scan of the chest.  Current symptoms remind him of his prior episode and he decided to seek care.  In the ED he was febrile, but importantly was also found to be anemic with Luis [name] hemoglobin in the 5 range.  Patient is reported dark stools intermittently over the last couple of weeks   Assessment & Plan:   Principal Problem:   SIRS (systemic inflammatory response syndrome) (HCC) Active Problems:   Acute on chronic anemia   Renal dysfunction   CAD (coronary artery disease)   Hyperlipidemia   OSA (obstructive sleep apnea)   Chronic diastolic CHF (congestive heart failure) (HCC)   Essential hypertension   Bladder cancer (HCC)   History of aortic valve replacement with tissue graft  Sepsis due to Klebsiella Bacteremia Presented with fever, leukocytosis, and now with klebsiella bacteremia CT abdomen/pelvis with mild bilateral hydroureteronephrosis (cystectomy with ileal conduit placement and prostatectomy) UA could be c/w with UTI, but not overwhelmingly so -> urine culture multiple species Most likely source would seem to be urinary Rigors 3/8 after procedure, follow repeat blood cultures - NGTD Follow  final culture results and adjust abx based on sensitivities -> unasyn for now Will discuss with ID given his recurrent bacteremia -> recommending 2 weeks augmentin (would start from 3/8 after procedure), follow up outpatient with ID  Acute kidney injury on CKD stage IV-baseline creatinine between 2 and 2.3, on admission it was 2.7 in the setting of febrile illness.  Creatinine improved to 3.5 today - what looks like post obstructive diuresis with 4.7 L out yesterday Will trend, mild hydro on imaging Renal consult - they've now signed off, will reschedule his follow up Appreciate urology assistance -> s/p bilateral nephrostomy tubes  Rhinovirus Infection Seems mild, follow  Anemia, concern for melena -patient reports several weeks of intermittent melena, currently he is without frank bleeding and fecal occult is negative.   -Gastroenterology consulted, appreciate input - > s/p EGD with nonbleeding gastric ulcers without stigmata of bleeding (biopsied), continued PPI daily - follow pathology Repeat Hb pending today - if stable could potentially discharge Transfuse as needed or for Hb <7 S/p 3 units pRBC   High-grade bladder cancer-status post total cystectomy with ileal conduit as well as prostatectomy-follows with oncology Dr. Leonides [name], recently started radiation therapy Luis [name] week prior to this admission and has completed 6 cycles.  Hyponatremia -monitor, in the setting of CKD, Lasix   Chronic diastolic CHF-continue Coreg, hold Lasix for now as he is febrile and normotensive   Hyperlipidemia-continue Crestor   Essential hypertension-continue amlodipine and carvedilol, hold Lasix as above   CAD-with history of PCI to RCA, stable, denies chest pain.  Holding aspirin with his  anemia.  Continue statin   OSA on CPAP-continue CPAP   Status post bioprosthetic AVR, ascending aorta replacement  noted  Obesity Body mass index is 39.85 kg/m.     DVT prophylaxis: SCD Code Status:  full Family Communication: wife at bedside Disposition:   Status is: Inpatient Remains inpatient appropriate because: need for follow up culture results, ongoing care   Consultants:  GI  Procedures:  EGD  Antimicrobials:  Anti-infectives (From admission, onward)    Start     Dose/Rate Route Frequency Ordered Stop   07/03/23 2200  amoxicillin-clavulanate (AUGMENTIN) 500-125 MG per tablet 1 tablet        1 tablet Oral 2 times daily 07/03/23 1441 07/15/23 2359   07/01/23 2000  Ampicillin-Sulbactam (UNASYN) 3 g in sodium chloride 0.9 % 100 mL IVPB  Status:  Discontinued        3 g 200 mL/hr over 30 Minutes Intravenous Every 12 hours 07/01/23 1815 07/03/23 1441   06/29/23 2200  amoxicillin-clavulanate (AUGMENTIN) 500-125 MG per tablet 1 tablet  Status:  Discontinued        1 tablet Oral 2 times daily 06/29/23 1516 07/01/23 1734   06/27/23 1700  cefTRIAXone (ROCEPHIN) 2 g in sodium chloride 0.9 % 100 mL IVPB  Status:  Discontinued        2 g 200 mL/hr over 30 Minutes Intravenous Daily-1600 06/27/23 1613 06/27/23 1613   06/27/23 1700  cefTRIAXone (ROCEPHIN) 2 g in sodium chloride 0.9 % 100 mL IVPB  Status:  Discontinued        2 g 200 mL/hr over 30 Minutes Intravenous Daily-1600 06/27/23 1614 06/29/23 1516   06/26/23 2330  Ampicillin-Sulbactam (UNASYN) 3 g in sodium chloride 0.9 % 100 mL IVPB  Status:  Discontinued        3 g 200 mL/hr over 30 Minutes Intravenous Every 8 hours 06/26/23 2318 06/27/23 1613       Subjective: No new complaints today - back pain improving  Objective: Vitals:   07/02/23 2021 07/03/23 0429 07/03/23 0839 07/03/23 1022  BP: (!) 103/58 111/68 111/68 111/68  Pulse: (!) 51 (!) 55 64   Resp: 18 17    Temp: 97.9 F (36.6 C) 98.8 F (37.1 C)    TempSrc:  Axillary    SpO2: 100% 99%    Weight:      Height:        Intake/Output Summary (Last 24 hours) at 07/03/2023 1604 Last data filed at 07/03/2023 1027 Gross per 24 hour  Intake 110 ml  Output 4525  ml  Net -4415 ml    Filed Weights   06/27/23 0130 06/28/23 1316  Weight: 112 kg 112 kg    Examination:  General: No acute distress. Cardiovascular: RRR Lungs: unlabored Abdomen: Soft, nontender, nondistended - clear yellow urine in bilateral nephrostomy tube drains Neurological: Alert and oriented 3. Moves all extremities 4 with equal strength. Cranial nerves II through XII grossly intact. Extremities: No clubbing or cyanosis. No edema.   Data Reviewed: I have personally reviewed following labs and imaging studies  CBC: Recent Labs  Lab 06/26/23 2100 06/27/23 1105 06/29/23 0423 06/30/23 0349 07/01/23 0356 07/02/23 0329 07/03/23 0350  WBC 12.3*   < > 5.6 5.3 5.3 9.7 5.3  NEUTROABS 10.9*  --  4.1 3.7  --  8.7* 3.9  HGB 5.8*   < > 7.6* 7.6* 7.5* 8.3* 7.2*  HCT 18.0*   < > 24.2* 24.3* 23.8* 26.6* 23.1*  MCV 89.1   < >  89.6 89.3 89.5 91.4 90.6  PLT 252   < > 159 141* 126* 121* 106*   < > = values in this interval not displayed.    Basic Metabolic Panel: Recent Labs  Lab 06/29/23 0423 06/30/23 0349 07/01/23 0356 07/02/23 0329 07/03/23 0350  NA 136 137 134* 134* 135  K 3.9 4.0 3.9 4.9 4.1  CL 106 105 106 107 108  CO2 19* 21* 18* 18* 20*  GLUCOSE 117* 108* 106* 134* 120*  BUN 39* 41* 39* 39* 36*  CREATININE 3.01* 3.25* 3.80* 4.27* 3.51*  CALCIUM 8.2* 7.8* 7.8* 7.9* 7.7*  MG 2.2 2.2 2.0 1.8 1.8  PHOS 4.8* 5.1* 5.5* 5.6* 4.2    GFR: Estimated Creatinine Clearance: 23.3 mL/min (Tayna Smethurst) (by C-G formula based on SCr of 3.51 mg/dL (H)).  Liver Function Tests: Recent Labs  Lab 06/27/23 1105 06/28/23 0500 06/29/23 0423 06/30/23 0349 07/02/23 0329  AST 29 30 57* 48* 27  ALT 29 37 59* 59* 44  ALKPHOS 100 105 159* 153* 146*  BILITOT 1.2 0.7 0.5 0.9 0.7  PROT 7.4 7.4 7.3 7.2 6.9  ALBUMIN 2.5* 2.4* 2.4* 2.3* 2.4*    CBG: No results for input(s): "GLUCAP" in the last 168 hours.   Recent Results (from the past 240 hours)  Resp panel by RT-PCR (RSV, Flu Edu On&B,  Covid)     Status: None   Collection Time: 06/26/23  9:00 PM   Specimen: Nasal Swab  Result Value Ref Range Status   SARS Coronavirus 2 by RT PCR NEGATIVE NEGATIVE Final    Comment: (NOTE) SARS-CoV-2 target nucleic acids are NOT DETECTED.  The SARS-CoV-2 RNA is generally detectable in upper respiratory specimens during the acute phase of infection. The lowest concentration of SARS-CoV-2 viral copies this assay can detect is 138 copies/mL. Lakecia Deschamps negative result does not preclude SARS-Cov-2 infection and should not be used as the sole basis for treatment or other patient management decisions. Yichen Gilardi negative result may occur with  improper specimen collection/handling, submission of specimen other than nasopharyngeal swab, presence of viral mutation(s) within the areas targeted by this assay, and inadequate number of viral copies(<138 copies/mL). Herron Fero negative result must be combined with clinical observations, patient history, and epidemiological information. The expected result is Negative.  Fact Sheet for Patients:  BloggerCourse.com  Fact Sheet for Healthcare Providers:  SeriousBroker.it  This test is no t yet approved or cleared by the [place] FDA and  has been authorized for detection and/or diagnosis of SARS-CoV-2 by FDA under an Emergency Use Authorization (EUA). This EUA will remain  in effect (meaning this test can be used) for the duration of the COVID-19 declaration under Section 564(b)(1) of the Act, 21 U.S.C.section 360bbb-3(b)(1), unless the authorization is terminated  or revoked sooner.       Influenza Mone Commisso by PCR NEGATIVE NEGATIVE Final   Influenza B by PCR NEGATIVE NEGATIVE Final    Comment: (NOTE) The Xpert Xpress SARS-CoV-2/FLU/RSV plus assay is intended as an aid in the diagnosis of influenza from Nasopharyngeal swab specimens and should not be used as Starkeisha Vanwinkle sole basis for treatment. Nasal washings and aspirates are  unacceptable for Xpert Xpress SARS-CoV-2/FLU/RSV testing.  Fact Sheet for Patients: BloggerCourse.com  Fact Sheet for Healthcare Providers: SeriousBroker.it  This test is not yet approved or cleared by the [place] FDA and has been authorized for detection and/or diagnosis of SARS-CoV-2 by FDA under an Emergency Use Authorization (EUA). This EUA will remain in effect (meaning this test can  be used) for the duration of the COVID-19 declaration under Section 564(b)(1) of the Act, 21 U.S.C. section 360bbb-3(b)(1), unless the authorization is terminated or revoked.     Resp Syncytial Virus by PCR NEGATIVE NEGATIVE Final    Comment: (NOTE) Fact Sheet for Patients: BloggerCourse.com  Fact Sheet for Healthcare Providers: SeriousBroker.it  This test is not yet approved or cleared by the [place] FDA and has been authorized for detection and/or diagnosis of SARS-CoV-2 by FDA under an Emergency Use Authorization (EUA). This EUA will remain in effect (meaning this test can be used) for the duration of the COVID-19 declaration under Section 564(b)(1) of the Act, 21 U.S.C. section 360bbb-3(b)(1), unless the authorization is terminated or revoked.  Performed at [facility], [address], [place], [place] [address]   Blood Culture (routine x 2)     Status: Abnormal   Collection Time: 06/26/23  9:00 PM   Specimen: BLOOD  Result Value Ref Range Status   Specimen Description   Final    BLOOD BLOOD RIGHT HAND Performed at [facility], [address] [place], [place] [address]    Special Requests   Final    BOTTLES DRAWN AEROBIC AND ANAEROBIC Blood Culture results may not be optimal due to an inadequate volume of blood received in culture bottles Performed at [facility], [address] [place], [place] [address]     Culture  Setup Time   Final    GRAM NEGATIVE RODS ANAEROBIC BOTTLE ONLY CRITICAL RESULT CALLED TO, READ BACK BY AND VERIFIED WITH: Roni Bread [id number] @ 1549 FH Performed at [facility], [address] [place], [place] [address]    Culture KLEBSIELLA OXYTOCA (Litzi Binning)  Final   Report Status 06/29/2023 FINAL  Final   Organism ID, Bacteria KLEBSIELLA OXYTOCA  Final      Susceptibility   Klebsiella oxytoca - MIC*    AMPICILLIN RESISTANT Resistant     CEFEPIME <=0.12 SENSITIVE Sensitive     CEFTAZIDIME <=1 SENSITIVE Sensitive     CEFTRIAXONE <=0.25 SENSITIVE Sensitive     CIPROFLOXACIN <=0.25 SENSITIVE Sensitive     GENTAMICIN <=1 SENSITIVE Sensitive     IMIPENEM 0.5 SENSITIVE Sensitive     TRIMETH/SULFA <=20 SENSITIVE Sensitive     AMPICILLIN/SULBACTAM 4 SENSITIVE Sensitive     PIP/TAZO <=4 SENSITIVE Sensitive ug/mL    * KLEBSIELLA OXYTOCA  Blood Culture (routine x 2)     Status: None   Collection Time: 06/26/23  9:00 PM   Specimen: BLOOD LEFT HAND  Result Value Ref Range Status   Specimen Description   Final    BLOOD LEFT HAND Performed at [facility], [address] [place], [place] [address]    Special Requests   Final    BOTTLES DRAWN AEROBIC AND ANAEROBIC Blood Culture results may not be optimal due to an inadequate volume of blood received in culture bottles Performed at [facility], [address], [place], [place] [address]    Culture   Final    NO GROWTH 5 DAYS Performed at [facility], [address], [place], [place] [address]    Report Status 07/01/2023 FINAL  Final  Blood Culture ID Panel (Reflexed)     Status: Abnormal   Collection Time: 06/26/23  9:00 PM  Result Value Ref Range Status   Enterococcus faecalis NOT DETECTED NOT DETECTED Final   Enterococcus Faecium NOT DETECTED NOT DETECTED Final  Listeria monocytogenes NOT DETECTED NOT DETECTED Final   Staphylococcus species NOT DETECTED NOT DETECTED Final    Staphylococcus aureus (BCID) NOT DETECTED NOT DETECTED Final   Staphylococcus epidermidis NOT DETECTED NOT DETECTED Final   Staphylococcus lugdunensis NOT DETECTED NOT DETECTED Final   Streptococcus species NOT DETECTED NOT DETECTED Final   Streptococcus agalactiae NOT DETECTED NOT DETECTED Final   Streptococcus pneumoniae NOT DETECTED NOT DETECTED Final   Streptococcus pyogenes NOT DETECTED NOT DETECTED Final   Jaeden [place]bay.calcoaceticus-baumannii NOT DETECTED NOT DETECTED Final   Bacteroides fragilis NOT DETECTED NOT DETECTED Final   Enterobacterales DETECTED (Vidalia Serpas) NOT DETECTED Final    Comment: Enterobacterales represent Demaris Bousquet large order of gram negative bacteria, not Arius Harnois single organism. CRITICAL RESULT CALLED TO, READ BACK BY AND VERIFIED WITH: PHARMD D. WOFFORD [id number] @ 1549 FH    Enterobacter cloacae complex NOT DETECTED NOT DETECTED Final   Escherichia coli NOT DETECTED NOT DETECTED Final   Klebsiella aerogenes NOT DETECTED NOT DETECTED Final   Klebsiella oxytoca DETECTED (Jeevan Kalla) NOT DETECTED Final    Comment: CRITICAL RESULT CALLED TO, READ BACK BY AND VERIFIED WITH: PHARMD D. [id number] @ 1549 FH    Klebsiella pneumoniae NOT DETECTED NOT DETECTED Final   Proteus species NOT DETECTED NOT DETECTED Final   Salmonella species NOT DETECTED NOT DETECTED Final   Serratia marcescens NOT DETECTED NOT DETECTED Final   Haemophilus influenzae NOT DETECTED NOT DETECTED Final   Neisseria meningitidis NOT DETECTED NOT DETECTED Final   Pseudomonas aeruginosa NOT DETECTED NOT DETECTED Final   Stenotrophomonas maltophilia NOT DETECTED NOT DETECTED Final   Candida albicans NOT DETECTED NOT DETECTED Final   Candida auris NOT DETECTED NOT DETECTED Final   Candida glabrata NOT DETECTED NOT DETECTED Final   Candida krusei NOT DETECTED NOT DETECTED Final   Candida parapsilosis NOT DETECTED NOT DETECTED Final   Candida tropicalis NOT DETECTED NOT DETECTED Final   Cryptococcus neoformans/gattii NOT DETECTED NOT  DETECTED Final   CTX-M ESBL NOT DETECTED NOT DETECTED Final   Carbapenem resistance IMP NOT DETECTED NOT DETECTED Final   Carbapenem resistance KPC NOT DETECTED NOT DETECTED Final   Carbapenem resistance NDM NOT DETECTED NOT DETECTED Final   Carbapenem resist OXA 48 LIKE NOT DETECTED NOT DETECTED Final   Carbapenem resistance VIM NOT DETECTED NOT DETECTED Final    Comment: Performed at [facility], [address], [place], [place] [address]  Urine Culture     Status: Abnormal   Collection Time: 06/27/23 12:24 AM   Specimen: Urine, Random  Result Value Ref Range Status   Specimen Description   Final    URINE, RANDOM Performed at [facility], [address] [place], [place] [address]    Special Requests   Final    NONE Reflexed from [id number] Performed at [facility], [address], [place], [place] [address]    Culture MULTIPLE SPECIES PRESENT, SUGGEST RECOLLECTION (Deklyn Trachtenberg)  Final   Report Status 06/28/2023 FINAL  Final  Respiratory (~20 pathogens) panel by PCR     Status: Abnormal   Collection Time: 06/27/23  6:28 AM   Specimen: Nasopharyngeal Swab; Respiratory  Result Value Ref Range Status   Adenovirus NOT DETECTED NOT DETECTED Final   Coronavirus 229E NOT DETECTED NOT DETECTED Final    Comment: (NOTE) The Coronavirus on the Respiratory Panel, DOES NOT test for the novel  Coronavirus (2019 nCoV)    Coronavirus HKU1 NOT DETECTED NOT DETECTED Final   Coronavirus NL63 NOT  DETECTED NOT DETECTED Final   Coronavirus OC43 NOT DETECTED NOT DETECTED Final   Metapneumovirus NOT DETECTED NOT DETECTED Final   Rhinovirus / Enterovirus DETECTED (Gennie Dib) NOT DETECTED Final   Influenza Jaydon Avina NOT DETECTED NOT DETECTED Final   Influenza B NOT DETECTED NOT DETECTED Final   Parainfluenza Virus 1 NOT DETECTED NOT DETECTED Final   Parainfluenza Virus 2 NOT DETECTED NOT DETECTED Final   Parainfluenza Virus 3 NOT DETECTED NOT DETECTED Final   Parainfluenza Virus 4  NOT DETECTED NOT DETECTED Final   Respiratory Syncytial Virus NOT DETECTED NOT DETECTED Final   Bordetella pertussis NOT DETECTED NOT DETECTED Final   Bordetella Parapertussis NOT DETECTED NOT DETECTED Final   Chlamydophila pneumoniae NOT DETECTED NOT DETECTED Final   Mycoplasma pneumoniae NOT DETECTED NOT DETECTED Final    Comment: Performed at [facility], [address] [place], [place] [address]  Aerobic/Anaerobic Culture w Gram Stain (surgical/deep wound)     Status: None (Preliminary result)   Collection Time: 07/01/23  4:21 PM   Specimen: Kidney; Urine  Result Value Ref Range Status   Specimen Description   Final    KIDNEY LEFT Performed at [facility], [address] [place], [place] [address]    Special Requests   Final    NONE Performed at [facility], [address], [place], [place] [address]    Gram Stain   Final    NO WBC SEEN RARE GRAM NEGATIVE RODS Performed at [facility], [address], [place], [place] [address]    Culture   Final    RARE ENTEROCOCCUS FAECALIS CULTURE REINCUBATED FOR BETTER GROWTH NO ANAEROBES ISOLATED; CULTURE IN PROGRESS FOR 5 DAYS    Report Status PENDING  Incomplete  Aerobic/Anaerobic Culture w Gram Stain (surgical/deep wound)     Status: None (Preliminary result)   Collection Time: 07/01/23  4:21 PM   Specimen: Kidney  Result Value Ref Range Status   Specimen Description   Final    KIDNEY RIGHT Performed at [facility], [address] [place], [place] [address]    Special Requests   Final    NONE Performed at [facility], [address], [place], [place] [address]    Gram Stain NO WBC SEEN NO ORGANISMS SEEN   Final   Culture   Final    NO GROWTH 2 DAYS NO ANAEROBES ISOLATED; CULTURE IN PROGRESS FOR 5 DAYS Performed at [facility], [address] [place], [place] [address]    Report Status PENDING  Incomplete  Culture, blood (Routine X 2) w Reflex  to ID Panel     Status: None (Preliminary result)   Collection Time: 07/01/23  6:21 PM   Specimen: BLOOD  Result Value Ref Range Status   Specimen Description   Final    BLOOD BLOOD RIGHT HAND AEROBIC BOTTLE ONLY Performed at [facility], [address], [place], [place] [address]    Special Requests   Final    Blood Culture adequate volume Performed at [facility], [address], [place], [place] [address]    Culture   Final    NO GROWTH 2 DAYS Performed at [facility], [address] [place], [place] [address]    Report Status PENDING  Incomplete  Culture, blood (Routine X 2) w Reflex to ID Panel     Status: None (Preliminary result)   Collection Time: 07/01/23  6:21 PM   Specimen: BLOOD  Result Value Ref Range Status   Specimen Description   Final    BLOOD BLOOD RIGHT HAND AEROBIC BOTTLE ONLY Performed at [facility], [address], [place], [place] [address]    Special Requests   Final    Blood Culture adequate volume Performed at [facility], [address] [place], [place] [address]    Culture   Final    NO GROWTH 2 DAYS Performed at [facility], [address] [place], [place] [address]    Report Status PENDING  Incomplete         Radiology Studies: No results found.       Scheduled Meds:  acetaminophen  1,000 mg Oral Q8H   amLODipine  5 mg Oral Daily   amoxicillin-clavulanate  1 tablet Oral BID   carvedilol  3.125 mg Oral BID WC   Chlorhexidine Gluconate Cloth  6 each Topical Daily   pantoprazole  40 mg Oral Daily   potassium chloride SA  20 mEq Oral Daily   rosuvastatin  10 mg Oral QHS   sodium chloride flush  10-40 mL Intracatheter Q12H   sodium chloride flush  3 mL Intravenous Q12H   venlafaxine XR  150 mg Oral Q breakfast   Continuous Infusions:     LOS: 6 days    Time spent: over 30 min    Lacretia Nicks, MD [facility]   To contact the attending provider between 7A-7P or the covering provider during after hours 7P-7A, please log into the web site [email or web address] and access using universal [place] password for that web site. If you do not have the password, please call the hospital operator.  07/03/2023, 4:04 PM

## 2023-07-03 NOTE — Progress Notes (Signed)
 5 Days Post-Op   Subjective/Chief Complaint:  1- Bladder Cancer - s/p cystectomy / conduit diversion 02/2023 for oligometastatic bladder cancer. Plan for adjuvant chemo [name]) and radiation Kathrynn Running). Chemo held as poor GFR.   2 - Gram Negative Bacteremia - Klebsiella bacteremia on CX 06/26/23. IV ABX bridged ot augmenin.  3 - Acute Renal Failure, Bilateral Hydronephrosis - new moderate hydro to distal ureters during admisison 06/2023. Cr rise to 4's up form baseline <1.5. No improvement with catheter across conduit therefore bilateral neph tubes placed 07/01/23.    Today "Luis [name]" is  improving some. GFR trending better with neph tubes. Less malaise.    Objective: Vital signs in last 24 hours: Temp:  [97.9 F (36.6 C)-98.8 F (37.1 C)] 98.8 F (37.1 C) (03/10 0429) Pulse Rate:  [51-64] 64 (03/10 1622) Resp:  [17-18] 17 (03/10 0429) BP: (103-111)/(58-68) 111/68 (03/10 1622) SpO2:  [99 %-100 %] 99 % (03/10 0429) Last BM Date : 07/01/23  Intake/Output from previous day: 03/09 0701 - 03/10 0700 In: 702 [I.V.:492; IV Piggyback:200] Out: 4700 [Urine:4700] Intake/Output this shift: Total I/O In: -  Out: 1425 [Urine:1425]   NAD, Resting, wife at bedsie Non-labored breathing on RA RRR STable truncal obesity. RLQ Urostomy pink and patent of non-foul urine and mucus Prior scars w/o hernias Bilaterl neph tuebs with veyr light tea colored urine that is non foul NO c/c/e   Lab Results:  Recent Labs    07/02/23 0329 07/03/23 0350  WBC 9.7 5.3  HGB 8.3* 7.2*  HCT 26.6* 23.1*  PLT 121* 106*   BMET Recent Labs    07/02/23 0329 07/03/23 0350  NA 134* 135  K 4.9 4.1  CL 107 108  CO2 18* 20*  GLUCOSE 134* 120*  BUN 39* 36*  CREATININE 4.27* 3.51*  CALCIUM 7.9* 7.7*   PT/INR Recent Labs    07/01/23 1209  LABPROT 15.5*  INR 1.2   ABG No results for input(s): "PHART", "HCO3" in the last 72 hours.  Invalid input(s): "PCO2", "PO2"  Studies/Results: No results  found.  Anti-infectives: Anti-infectives (From admission, onward)    Start     Dose/Rate Route Frequency Ordered Stop   07/03/23 2200  amoxicillin-clavulanate (AUGMENTIN) 500-125 MG per tablet 1 tablet        1 tablet Oral 2 times daily 07/03/23 1441 07/15/23 2359   07/01/23 2000  Ampicillin-Sulbactam (UNASYN) 3 g in sodium chloride 0.9 % 100 mL IVPB  Status:  Discontinued        3 g 200 mL/hr over 30 Minutes Intravenous Every 12 hours 07/01/23 1815 07/03/23 1441   06/29/23 2200  amoxicillin-clavulanate (AUGMENTIN) 500-125 MG per tablet 1 tablet  Status:  Discontinued        1 tablet Oral 2 times daily 06/29/23 1516 07/01/23 1734   06/27/23 1700  cefTRIAXone (ROCEPHIN) 2 g in sodium chloride 0.9 % 100 mL IVPB  Status:  Discontinued        2 g 200 mL/hr over 30 Minutes Intravenous Daily-1600 06/27/23 1613 06/27/23 1613   06/27/23 1700  cefTRIAXone (ROCEPHIN) 2 g in sodium chloride 0.9 % 100 mL IVPB  Status:  Discontinued        2 g 200 mL/hr over 30 Minutes Intravenous Daily-1600 06/27/23 1614 06/29/23 1516   06/26/23 2330  Ampicillin-Sulbactam (UNASYN) 3 g in sodium chloride 0.9 % 100 mL IVPB  Status:  Discontinued        3 g 200 mL/hr over 30 Minutes Intravenous Every 8 hours  06/26/23 2318 06/27/23 1613       Assessment/Plan:  Pt with likely partial bilateral uretero-enteric strictures cuasing ARF and predisposing to infection. Now temporized with neph tubes. Safest mid-term managemtn would be to continue until completed adjuvant chemo, then consdier bilateral antegrade ureteroscopy / dilation if possible in effort to get neph tube free.  Appreciate hospitlaist, nephrol, med onc, rad-onc multi-team comanagmetn.    Loletta Parish. 07/03/2023

## 2023-07-03 NOTE — Progress Notes (Signed)
 Referring Physician(s): Dorsey,John T IV  Supervising Physician: Oley Balm  Patient Status:  [facility] - In-pt  Chief Complaint: fever and bilateral nephrosis in a patient with previously placed urostomy; s/p image guided bilateral percutaneous nephrostomy drains which were placed in IR on 07/01/23 and are draining well today. An image guided right chest implanted port-a-cath was also placed to address long term antibiotics need.  Subjective:  Pt is lying in bed with wife at bedside, states that he is feeling generally well and has no concers for bilateral PCNs and R chest port placed 07/01/23 by IR.   Allergies: Patient has no known allergies.  Medications: Prior to Admission medications   Medication Sig Start Date End Date Taking? Authorizing Provider  acetaminophen (TYLENOL) 325 MG tablet Take 650 mg by mouth every 6 (six) hours as needed (for pain).    [provider]  amLODipine (NORVASC) 5 MG tablet Take 1 tablet (5 mg total) by mouth daily. 05/16/23   Jaci Standard, MD  aspirin EC 81 MG tablet Take 81 mg by mouth in the morning.    [provider]  carvedilol (COREG) 3.125 MG tablet TAKE 1 TABLET BY MOUTH TWICE A DAY WITH FOOD 05/10/23   Laurey Morale, MD  cyanocobalamin (VITAMIN B12) 1000 MCG tablet Take 1 tablet (1,000 mcg total) by mouth daily. 06/15/23   Jaci Standard, MD  diazepam (VALIUM) 5 MG tablet TAKE 1 TABLET BY MOUTH EVERY 12 HOURS AS NEEDED FOR ANXIETY. 03/29/23   Nelwyn Salisbury, MD  furosemide (LASIX) 20 MG tablet Take 1 tablet (20 mg total) by mouth daily. 06/15/23   Laurey Morale, MD  ipratropium (ATROVENT) 0.03 % nasal spray Place 2 sprays into both nostrils every 12 (twelve) hours as needed for rhinitis.    [provider]  JARDIANCE 10 MG TABS tablet Take 1 tablet (10 mg total) by mouth daily. Patient not taking: Reported on 06/20/2023 05/10/23   Laurey Morale, MD  lidocaine-prilocaine (EMLA) cream Apply 1 Application  topically as needed. 05/16/23   Jaci Standard, MD  Magnesium 500 MG TABS Take 500 mg by mouth in the morning.    [provider]  meclizine (ANTIVERT) 25 MG tablet Take 1 tablet (25 mg total) by mouth 3 (three) times daily as needed for dizziness. 09/09/22   Nelwyn Salisbury, MD  multivitamin-iron-minerals-folic acid (CENTRUM) chewable tablet Chew 1 tablet by mouth daily. 09/23/21   Barrett, Erin R, PA-C  nitroGLYCERIN (NITROSTAT) 0.4 MG SL tablet Place 0.4 mg under the tongue every 5 (five) minutes as needed for chest pain. 11/03/17 12/05/25  [provider]  ondansetron (ZOFRAN) 8 MG tablet Take 1 tablet (8 mg total) by mouth every 8 (eight) hours as needed. 05/16/23   Jaci Standard, MD  potassium chloride SA (KLOR-CON M20) 20 MEQ tablet Take 1 tablet (20 mEq total) by mouth daily. 05/26/23   [place], Anderson Malta, FNP  prochlorperazine (COMPAZINE) 10 MG tablet Take 1 tablet (10 mg total) by mouth every 6 (six) hours as needed for nausea or vomiting. 05/16/23   Jaci Standard, MD  rosuvastatin (CRESTOR) 20 MG tablet TAKE 1 TABLET BY MOUTH EVERYDAY AT BEDTIME 02/06/23   [place], Anderson Malta, FNP  venlafaxine XR (EFFEXOR-XR) 150 MG 24 hr capsule TAKE 1 CAPSULE BY MOUTH DAILY WITH BREAKFAST. 05/11/23   Nelwyn Salisbury, MD     Vital Signs: 111/68, 55bpm, 98.32F (ax), 17RR, 99%RA  at 0429 on 07/03/23   Physical Exam Genitourinary:    Comments: Bilateral PCN drains present and patent with yellow urine in collection bags Neurological:     Mental Status: He is alert and oriented to person, place, and time.  Psychiatric:        Mood and Affect: Mood normal.      Imaging: CT 06/27/23 IMPRESSION: Status post cystectomy with ileal conduit placement, as well as prostatectomy. Mild bilateral hydroureteronephrosis is noted without obstructing calculus.  Aortic Atherosclerosis (ICD10-I70.0).   Electronically Signed By: Lupita Raider M.D. On: 06/27/2023 15:28   Labs:  CBC: Recent  Labs    06/30/23 0349 07/01/23 0356 07/02/23 0329 07/03/23 0350  WBC 5.3 5.3 9.7 5.3  HGB 7.6* 7.5* 8.3* 7.2*  HCT 24.3* 23.8* 26.6* 23.1*  PLT 141* 126* 121* 106*    COAGS: Recent Labs    04/13/23 1024 06/26/23 2100 07/01/23 1209  INR 1.3* 1.3* 1.2  APTT 38* 34  --     BMP: Recent Labs    06/30/23 0349 07/01/23 0356 07/02/23 0329 07/03/23 0350  NA 137 134* 134* 135  K 4.0 3.9 4.9 4.1  CL 105 106 107 108  CO2 21* 18* 18* 20*  GLUCOSE 108* 106* 134* 120*  BUN 41* 39* 39* 36*  CALCIUM 7.8* 7.8* 7.9* 7.7*  CREATININE 3.25* 3.80* 4.27* 3.51*  GFRNONAA 20* 16* 14* 18*    LIVER FUNCTION TESTS: Recent Labs    06/28/23 0500 06/29/23 0423 06/30/23 0349 07/02/23 0329  BILITOT 0.7 0.5 0.9 0.7  AST 30 57* 48* 27  ALT 37 59* 59* 44  ALKPHOS 105 159* 153* 146*  PROT 7.4 7.3 7.2 6.9  ALBUMIN 2.4* 2.4* 2.3* 2.4*    Assessment and Plan:  This is 68 year old male with history of high-grade bladder cancer status post cystoprostatectomy with urostomy in place, currently undergoing radiation therapy started a week prior to admission, chronic diastolic CHF, CAD with PCI to RCA, severe AAS status post bioprosthetic AVR and ascending aorta replacement, HTN, HLD, OSA on CPAP. He presented to the ER on 06/30/23 with fever and elevated creatinine with urostomy in place. 3/4/ 25 CT : Mild bilateral hydroureteronephrosis is noted without obstructing calculus. Request for image guided bilateral percutaneous nephrostomy drains which were placed in IR on 07/01/23 and are draining well today. Creatinine is improving, urology notes baseline around 2.    Image guided R chest port-a-cath also placed on 07/01/23. At time of exam R chest port-a-cath is in use and free from swelling or erythema. Patient also has no concerns about port-a-cath.      Electronically Signed: Carlton Adam, NP 07/03/2023, 3:11 PM   I spent a total of 15 Minutes at the the patient's bedside AND on the patient's  hospital floor or unit, greater than 50% of which was counseling/coordinating care for s/p image guided bilateral percutaneous nephrostomy drains and R chest port-a-cath placed 07/01/23

## 2023-07-04 ENCOUNTER — Other Ambulatory Visit (HOSPITAL_COMMUNITY): Payer: Self-pay

## 2023-07-04 ENCOUNTER — Telehealth: Payer: Self-pay | Admitting: *Deleted

## 2023-07-04 ENCOUNTER — Encounter: Payer: Self-pay | Admitting: Gastroenterology

## 2023-07-04 ENCOUNTER — Encounter: Payer: Self-pay | Admitting: Hematology and Oncology

## 2023-07-04 ENCOUNTER — Other Ambulatory Visit: Payer: Self-pay

## 2023-07-04 ENCOUNTER — Ambulatory Visit
Admission: RE | Admit: 2023-07-04 | Discharge: 2023-07-04 | Disposition: A | Discharge status: Home / Self Care | Payer: PPO | Source: Ambulatory Visit | Attending: Radiation Oncology | Admitting: Radiation Oncology

## 2023-07-04 DIAGNOSIS — Z510 Encounter for antineoplastic radiation therapy: Secondary | ICD-10-CM | POA: Diagnosis not present

## 2023-07-04 DIAGNOSIS — C678 Malignant neoplasm of overlapping sites of bladder: Secondary | ICD-10-CM | POA: Diagnosis not present

## 2023-07-04 LAB — RAD ONC ARIA SESSION SUMMARY
Course Elapsed Days: 15
Plan Fractions Treated to Date: 11
Plan Prescribed Dose Per Fraction: 1.8 Gy
Plan Total Fractions Prescribed: 28
Plan Total Prescribed Dose: 50.4 Gy
Reference Point Dosage Given to Date: 19.8 Gy
Reference Point Session Dosage Given: 1.8 Gy
Session Number: 11

## 2023-07-04 MED ORDER — SODIUM CHLORIDE 0.9% FLUSH
10.0000 mL | INTRAVENOUS | 2 refills | Status: DC | PRN
  Filled 2023-07-04: qty 300, 30d supply, fill #0

## 2023-07-04 NOTE — Progress Notes (Addendum)
 Patient discharged 07/03/23 after hours. Called 07/04/23, spoke to patient wife. Reviewed the following: -drain dressing changes -flushing drains as needed for decreased output -red flag sx -f/u (order placed for exchange in ~6 weeks)  Pt wife and patient report no questions at this time.

## 2023-07-04 NOTE — Transitions of Care (Post Inpatient/ED Visit) (Signed)
 07/04/2023  Name: Luis [name] MRN: [id number] DOB: 26-Dec-1955  Today's TOC FU Call Status: Today's TOC FU Call Status:: Successful TOC FU Call Completed TOC FU Call Complete Date: 07/04/23 Patient's Name and Date of Birth confirmed.  Transition Care Management Follow-up Telephone Call Date of Discharge: 07/03/23 Discharge Facility: [facility]) Type of Discharge: Inpatient Admission Primary Inpatient Discharge Diagnosis:: systemic inflammatory response syndrome How have you been since you were released from the hospital?: Better Any questions or concerns?: No  Items Reviewed: Did you receive and understand the discharge instructions provided?: Yes Medications obtained,verified, and reconciled?: Yes (Medications Reviewed) Any new allergies since your discharge?: No Dietary orders reviewed?: No Do you have support at home?: Yes People in Home: spouse Name of Support/Comfort Primary Source: [employer]oc[employer] Homes  Medications Reviewed Today: Medications Reviewed Today     Reviewed by Luella Cook, RN (Case Manager) on 07/04/23 at 1634  Med List Status: <None>   Medication Order Taking? Sig Documenting Provider Last Dose Status Informant  acetaminophen (TYLENOL) 325 MG tablet [id number] Yes Take 650 mg by mouth every 6 (six) hours as needed (for pain). [provider] Taking Active Self  amLODipine (NORVASC) 5 MG tablet [id number] Yes Take 1 tablet (5 mg total) by mouth daily. Jaci Standard, MD Taking Active Self  amoxicillin-clavulanate (AUGMENTIN) 500-125 MG tablet [id number] Yes Take 1 tablet by mouth 2 (two) times daily for 12 days. Repeat labs with your PCP sometime within the next week.  If your kidney function improves, this dose may be adjusted. Zigmund Daniel., MD Taking Active   aspirin EC 81 MG tablet [id number] Yes Take 81 mg by mouth in the morning. [provider] Taking Active Self  carvedilol (COREG) 3.125 MG tablet [id number] Yes TAKE 1 TABLET BY  MOUTH TWICE A DAY WITH FOOD Laurey Morale, MD Taking Active Self  cyanocobalamin (VITAMIN B12) 1000 MCG tablet [id number] Yes Take 1 tablet (1,000 mcg total) by mouth daily. Jaci Standard, MD Taking Active Self  diazepam (VALIUM) 5 MG tablet [id number] Yes TAKE 1 TABLET BY MOUTH EVERY 12 HOURS AS NEEDED FOR ANXIETY. Nelwyn Salisbury, MD Taking Active Self  ferrous sulfate 325 (65 FE) MG EC tablet [id number] Yes Take 1 tablet (325 mg total) by mouth every other day. Zigmund Daniel., MD Taking Active   furosemide (LASIX) 20 MG tablet [id number] Yes Take 1 tablet (20 mg total) by mouth daily. Laurey Morale, MD Taking Active Self  ipratropium (ATROVENT) 0.03 % nasal spray [id number] Yes Place 2 sprays into both nostrils every 12 (twelve) hours as needed for rhinitis. [provider] Taking Active Self  lidocaine-prilocaine (EMLA) cream [id number] Yes Apply 1 Application topically as needed. Jaci Standard, MD Taking Active Self           Med Note Gunnar Fusi, MELISSA R   Wed Jun 14, 2023 11:06 AM)    Magnesium 500 MG TABS [id number] Yes Take 500 mg by mouth in the morning. [provider] Taking Active Self  meclizine (ANTIVERT) 25 MG tablet [id number] Yes Take 1 tablet (25 mg total) by mouth 3 (three) times daily as needed for dizziness. Nelwyn Salisbury, MD Taking Active Self  multivitamin-iron-minerals-folic acid (CENTRUM) chewable tablet [id number] Yes Chew 1 tablet by mouth daily. Barrett, Rae Roam, PA-C Taking Active Self  nitroGLYCERIN (NITROSTAT) 0.4 MG SL tablet [id number] Yes Place 0.4 mg under the tongue every 5 (five) minutes as needed for chest  pain. [provider] Taking Active Self           Med Note Suezanne Cheshire   Tue Jun 20, 2023  9:33 AM)    ondansetron (ZOFRAN) 8 MG tablet [id number] Yes Take 1 tablet (8 mg total) by mouth every 8 (eight) hours as needed. Jaci Standard, MD Taking Active Self           Med Note Suezanne Cheshire   Tue Jun 20, 2023  9:32 AM)    pantoprazole (PROTONIX) 40 MG tablet [id number] Yes Take 1 tablet (40 mg total) by mouth daily. Zigmund Daniel., MD Taking Active   potassium chloride SA (KLOR-CON M20) 20 MEQ tablet [id number] Yes Take 1 tablet (20 mEq total) by mouth daily. [place], Anderson Malta, FNP Taking Active Self  prochlorperazine (COMPAZINE) 10 MG tablet [id number] Yes Take 1 tablet (10 mg total) by mouth every 6 (six) hours as needed for nausea or vomiting. Jaci Standard, MD Taking Active Self           Med Note Suezanne Cheshire   Tue Jun 20, 2023  9:32 AM)    rosuvastatin (CRESTOR) 10 MG tablet [id number] Yes Take 1 tablet (10 mg total) by mouth at bedtime. Reduce your dose of crestor to 10 mg daily.  Follow with your PCP outpatient to follow your kidney function to determine if this dose reduction should be long term. Zigmund Daniel., MD Taking Active   sodium chloride flush (NS) 0.9 % SOLN [id number] Yes Flush catheter slowly with 5ml as needed if output decreases. [place], [place], NP Taking Active   venlafaxine XR (EFFEXOR-XR) 150 MG 24 hr capsule [id number] Yes TAKE 1 CAPSULE BY MOUTH DAILY WITH BREAKFAST. Nelwyn Salisbury, MD Taking Active Self  Med List Note Halford Decamp, CPhT 03/01/23 1119): CPAP at night            Home Care and Equipment/Supplies: Were Home Health Services Ordered?: NA Any new equipment or medical supplies ordered?: NA  Functional Questionnaire: Do you need assistance with bathing/showering or dressing?: No Do you need assistance with meal preparation?: Yes Do you need assistance with eating?: No Do you have difficulty maintaining continence: No Do you need assistance with getting out of bed/getting out of a chair/moving?: No Do you have difficulty managing or taking your medications?: No  Follow up appointments reviewed: PCP Follow-up appointment confirmed?: Yes Date of PCP follow-up appointment?: 07/11/23 Follow-up Provider: Dr  Clent Ridges [facility] Follow-up appointment confirmed?: No Reason Specialist Follow-Up Not Confirmed: Patient has Specialist Provider Number and will Call for Appointment Do you need transportation to your follow-up appointment?: No Do you understand care options if your condition(s) worsen?: Yes-patient verbalized understanding  SDOH Interventions Today    Flowsheet Row Most Recent Value  SDOH Interventions   Food Insecurity Interventions Intervention Not Indicated  Housing Interventions Intervention Not Indicated  Transportation Interventions Intervention Not Indicated, Patient Resources (Friends/Family)  Utilities Interventions Intervention Not Indicated      Interventions Today    Flowsheet Row Most Recent Value  Chronic Disease   Chronic disease during today's visit Other  [systemic inflammatory response syndrome]  General Interventions   General Interventions Discussed/Reviewed General Interventions Discussed, General Interventions Reviewed, Doctor Visits  Doctor Visits Discussed/Reviewed Doctor Visits Discussed, Doctor Visits Reviewed, PCP, Specialist  PCP/Specialist Visits Compliance with follow-up visit  Pharmacy Interventions   Pharmacy Dicussed/Reviewed Pharmacy Topics Discussed, Pharmacy Topics Reviewed  Gean Maidens BSN RN [facility] Care Management Coordinator [email or web address] Direct Dial: [phone]  Fax: [phone] Website: [email or web address]

## 2023-07-05 ENCOUNTER — Other Ambulatory Visit (HOSPITAL_COMMUNITY): Payer: Self-pay

## 2023-07-05 ENCOUNTER — Other Ambulatory Visit: Payer: Self-pay

## 2023-07-05 ENCOUNTER — Ambulatory Visit
Admission: RE | Admit: 2023-07-05 | Discharge: 2023-07-05 | Disposition: A | Discharge status: Home / Self Care | Payer: PPO | Source: Ambulatory Visit | Attending: Radiation Oncology | Admitting: Radiation Oncology

## 2023-07-05 DIAGNOSIS — C678 Malignant neoplasm of overlapping sites of bladder: Secondary | ICD-10-CM | POA: Diagnosis not present

## 2023-07-05 DIAGNOSIS — Z510 Encounter for antineoplastic radiation therapy: Secondary | ICD-10-CM | POA: Diagnosis not present

## 2023-07-05 LAB — RAD ONC ARIA SESSION SUMMARY
Course Elapsed Days: 16
Plan Fractions Treated to Date: 12
Plan Prescribed Dose Per Fraction: 1.8 Gy
Plan Total Fractions Prescribed: 28
Plan Total Prescribed Dose: 50.4 Gy
Reference Point Dosage Given to Date: 21.6 Gy
Reference Point Session Dosage Given: 1.8 Gy
Session Number: 12

## 2023-07-06 ENCOUNTER — Other Ambulatory Visit: Payer: Self-pay

## 2023-07-06 ENCOUNTER — Ambulatory Visit
Admission: RE | Admit: 2023-07-06 | Discharge: 2023-07-06 | Disposition: A | Discharge status: Home / Self Care | Payer: PPO | Source: Ambulatory Visit | Attending: Radiation Oncology | Admitting: Radiation Oncology

## 2023-07-06 DIAGNOSIS — C678 Malignant neoplasm of overlapping sites of bladder: Secondary | ICD-10-CM | POA: Diagnosis not present

## 2023-07-06 DIAGNOSIS — Z510 Encounter for antineoplastic radiation therapy: Secondary | ICD-10-CM | POA: Diagnosis not present

## 2023-07-06 LAB — RAD ONC ARIA SESSION SUMMARY
Course Elapsed Days: 17
Plan Fractions Treated to Date: 13
Plan Prescribed Dose Per Fraction: 1.8 Gy
Plan Total Fractions Prescribed: 28
Plan Total Prescribed Dose: 50.4 Gy
Reference Point Dosage Given to Date: 23.4 Gy
Reference Point Session Dosage Given: 1.8 Gy
Session Number: 13

## 2023-07-06 LAB — AEROBIC/ANAEROBIC CULTURE W GRAM STAIN (SURGICAL/DEEP WOUND)
Gram Stain: NONE SEEN
Gram Stain: NONE SEEN

## 2023-07-06 LAB — CULTURE, BLOOD (ROUTINE X 2)
Culture: NO GROWTH
Culture: NO GROWTH
Special Requests: ADEQUATE
Special Requests: ADEQUATE

## 2023-07-07 ENCOUNTER — Ambulatory Visit
Admission: RE | Admit: 2023-07-07 | Discharge: 2023-07-07 | Disposition: A | Discharge status: Home / Self Care | Source: Ambulatory Visit | Attending: Radiation Oncology | Admitting: Radiation Oncology

## 2023-07-07 ENCOUNTER — Ambulatory Visit
Admission: RE | Admit: 2023-07-07 | Discharge: 2023-07-07 | Disposition: A | Discharge status: Home / Self Care | Payer: PPO | Source: Ambulatory Visit | Attending: Radiation Oncology | Admitting: Radiation Oncology

## 2023-07-07 ENCOUNTER — Other Ambulatory Visit: Payer: Self-pay

## 2023-07-07 DIAGNOSIS — C678 Malignant neoplasm of overlapping sites of bladder: Secondary | ICD-10-CM | POA: Diagnosis not present

## 2023-07-07 DIAGNOSIS — Z510 Encounter for antineoplastic radiation therapy: Secondary | ICD-10-CM | POA: Diagnosis not present

## 2023-07-07 LAB — RAD ONC ARIA SESSION SUMMARY
Course Elapsed Days: 18
Plan Fractions Treated to Date: 14
Plan Prescribed Dose Per Fraction: 1.8 Gy
Plan Total Fractions Prescribed: 28
Plan Total Prescribed Dose: 50.4 Gy
Reference Point Dosage Given to Date: 25.2 Gy
Reference Point Session Dosage Given: 1.8 Gy
Session Number: 14

## 2023-07-10 ENCOUNTER — Ambulatory Visit
Admission: RE | Admit: 2023-07-10 | Discharge: 2023-07-10 | Disposition: A | Discharge status: Home / Self Care | Payer: PPO | Source: Ambulatory Visit | Attending: Radiation Oncology | Admitting: Radiation Oncology

## 2023-07-10 ENCOUNTER — Other Ambulatory Visit: Payer: Self-pay

## 2023-07-10 DIAGNOSIS — Z510 Encounter for antineoplastic radiation therapy: Secondary | ICD-10-CM | POA: Diagnosis not present

## 2023-07-10 DIAGNOSIS — C678 Malignant neoplasm of overlapping sites of bladder: Secondary | ICD-10-CM | POA: Diagnosis not present

## 2023-07-10 LAB — RAD ONC ARIA SESSION SUMMARY
Course Elapsed Days: 21
Plan Fractions Treated to Date: 15
Plan Prescribed Dose Per Fraction: 1.8 Gy
Plan Total Fractions Prescribed: 28
Plan Total Prescribed Dose: 50.4 Gy
Reference Point Dosage Given to Date: 27 Gy
Reference Point Session Dosage Given: 1.8 Gy
Session Number: 15

## 2023-07-11 ENCOUNTER — Inpatient Hospital Stay: Admitting: Family Medicine

## 2023-07-11 ENCOUNTER — Encounter: Payer: Self-pay | Admitting: Family Medicine

## 2023-07-11 ENCOUNTER — Inpatient Hospital Stay: Payer: PPO

## 2023-07-11 ENCOUNTER — Ambulatory Visit: Admitting: Family Medicine

## 2023-07-11 ENCOUNTER — Ambulatory Visit
Admission: RE | Admit: 2023-07-11 | Discharge: 2023-07-11 | Disposition: A | Discharge status: Home / Self Care | Payer: PPO | Source: Ambulatory Visit | Attending: Radiation Oncology

## 2023-07-11 ENCOUNTER — Other Ambulatory Visit: Payer: Self-pay

## 2023-07-11 VITALS — BP 144/74 | HR 64 | Temp 97.8°F | Ht 66.0 in | Wt 249.4 lb

## 2023-07-11 DIAGNOSIS — N390 Urinary tract infection, site not specified: Secondary | ICD-10-CM | POA: Diagnosis not present

## 2023-07-11 DIAGNOSIS — I10 Essential (primary) hypertension: Secondary | ICD-10-CM | POA: Diagnosis not present

## 2023-07-11 DIAGNOSIS — C671 Malignant neoplasm of dome of bladder: Secondary | ICD-10-CM | POA: Insufficient documentation

## 2023-07-11 DIAGNOSIS — D500 Iron deficiency anemia secondary to blood loss (chronic): Secondary | ICD-10-CM | POA: Diagnosis not present

## 2023-07-11 DIAGNOSIS — C678 Malignant neoplasm of overlapping sites of bladder: Secondary | ICD-10-CM | POA: Diagnosis not present

## 2023-07-11 DIAGNOSIS — K254 Chronic or unspecified gastric ulcer with hemorrhage: Secondary | ICD-10-CM

## 2023-07-11 DIAGNOSIS — A419 Sepsis, unspecified organism: Secondary | ICD-10-CM

## 2023-07-11 DIAGNOSIS — N179 Acute kidney failure, unspecified: Secondary | ICD-10-CM

## 2023-07-11 DIAGNOSIS — C679 Malignant neoplasm of bladder, unspecified: Secondary | ICD-10-CM

## 2023-07-11 DIAGNOSIS — Z510 Encounter for antineoplastic radiation therapy: Secondary | ICD-10-CM | POA: Diagnosis not present

## 2023-07-11 LAB — CBC WITH DIFFERENTIAL (CANCER CENTER ONLY)
Abs Immature Granulocytes: 0.07 10*3/uL (ref 0.00–0.07)
Basophils Absolute: 0 10*3/uL (ref 0.0–0.1)
Basophils Relative: 1 %
Eosinophils Absolute: 0.3 10*3/uL (ref 0.0–0.5)
Eosinophils Relative: 4 %
HCT: 26.4 % — ABNORMAL LOW (ref 39.0–52.0)
Hemoglobin: 8.6 g/dL — ABNORMAL LOW (ref 13.0–17.0)
Immature Granulocytes: 1 %
Lymphocytes Relative: 10 %
Lymphs Abs: 0.6 10*3/uL — ABNORMAL LOW (ref 0.7–4.0)
MCH: 28.2 pg (ref 26.0–34.0)
MCHC: 32.6 g/dL (ref 30.0–36.0)
MCV: 86.6 fL (ref 80.0–100.0)
Monocytes Absolute: 0.3 10*3/uL (ref 0.1–1.0)
Monocytes Relative: 5 %
Neutro Abs: 5.1 10*3/uL (ref 1.7–7.7)
Neutrophils Relative %: 79 %
Platelet Count: 140 10*3/uL — ABNORMAL LOW (ref 150–400)
RBC: 3.05 MIL/uL — ABNORMAL LOW (ref 4.22–5.81)
RDW: 15.7 % — ABNORMAL HIGH (ref 11.5–15.5)
WBC Count: 6.4 10*3/uL (ref 4.0–10.5)
nRBC: 0 % (ref 0.0–0.2)

## 2023-07-11 LAB — CMP (CANCER CENTER ONLY)
ALT: 23 U/L (ref 0–44)
AST: 18 U/L (ref 15–41)
Albumin: 3.5 g/dL (ref 3.5–5.0)
Alkaline Phosphatase: 119 U/L (ref 38–126)
Anion gap: 6 (ref 5–15)
BUN: 19 mg/dL (ref 8–23)
CO2: 24 mmol/L (ref 22–32)
Calcium: 8.5 mg/dL — ABNORMAL LOW (ref 8.9–10.3)
Chloride: 108 mmol/L (ref 98–111)
Creatinine: 1.74 mg/dL — ABNORMAL HIGH (ref 0.61–1.24)
GFR, Estimated: 42 mL/min — ABNORMAL LOW (ref >60)
Glucose, Bld: 130 mg/dL — ABNORMAL HIGH (ref 70–99)
Potassium: 3.7 mmol/L (ref 3.5–5.1)
Sodium: 138 mmol/L (ref 135–145)
Total Bilirubin: 0.5 mg/dL (ref 0.0–1.2)
Total Protein: 8 g/dL (ref 6.5–8.1)

## 2023-07-11 LAB — RAD ONC ARIA SESSION SUMMARY
Course Elapsed Days: 22
Plan Fractions Treated to Date: 16
Plan Prescribed Dose Per Fraction: 1.8 Gy
Plan Total Fractions Prescribed: 28
Plan Total Prescribed Dose: 50.4 Gy
Reference Point Dosage Given to Date: 28.8 Gy
Reference Point Session Dosage Given: 1.8 Gy
Session Number: 16

## 2023-07-11 MED ORDER — PANTOPRAZOLE SODIUM 40 MG PO TBEC: 40.0000 mg | DELAYED_RELEASE_TABLET | Freq: Two times a day (BID) | ORAL | 5 refills | Status: DC

## 2023-07-11 NOTE — Progress Notes (Signed)
   Subjective:    Patient ID: Luis [name], male    DOB: [birth year], 68 y.o.   MRN: [id number]  HPI Here with his wife for a transitional care visit to follow up a hospital stay from 06-26-23 to 07-03-23 for a UTI and sepsis. He presented with fatigue and fever, and he was found to have a UTI with systemic inflammatory response syndrome. He had AKI, and he was found to have bilateral hydroureteronephrosis by CT scan. He was treated with IV fluids and antibiotics, and Urology placed bilateral percutaneous nephrostomy tubes. His urine grew both Klebsiella and Enterobacter, and one blood culture grew Klebsiella. His creatinine was 4.27 on admission and was down to 1.74 by DC. He was quite anemic, with the admission Hgb at 7.2. after transfusing 3 PRBC, this was up to 8.6 by DC. An upper endoscopy revealed several non-bleeding gastric ulcers, so he was started on Pantoprazole 40 mg daily. His potassium remained normal the entire time. His Lasix and Potassium was held, and his [place] Pepper was totally stopped. He was sent home with 10 days of Augmentin, and he has a few more days of this to go. He is drinking fluids and his appetite is slowly coming back. He still has a burning pain in the middle of his abdomen that comes and goes. He is still weak, but he feels his strength improve a little each day.    Review of Systems  Constitutional:  Positive for fatigue. Negative for fever.  Respiratory: Negative.    Cardiovascular: Negative.   Gastrointestinal:  Positive for abdominal pain and diarrhea. Negative for abdominal distention, blood in stool, constipation, nausea and vomiting.  Genitourinary:  Negative for hematuria.       Objective:   Physical Exam Constitutional:      Appearance: Normal appearance.  Cardiovascular:     Rate and Rhythm: Normal rate and regular rhythm.     Pulses: Normal pulses.     Heart sounds: Normal heart sounds.  Pulmonary:     Effort: Pulmonary effort is normal.     Breath  sounds: Normal breath sounds.  Abdominal:     General: Abdomen is flat. Bowel sounds are normal. There is no distension.     Palpations: There is no mass.     Tenderness: There is no abdominal tenderness. There is no rebound.     Hernia: No hernia is present.     Comments: Bilateral nephrostomy tubes are in place   Musculoskeletal:     Right lower leg: No edema.     Left lower leg: No edema.  Neurological:     Mental Status: He is alert.           Assessment & Plan:  He is recovering from a urosepsis and an AKI that were related to bilateral hydroureteronephrosis. He will finish up the Augmentin. He is scheduled to follow up with Urology on 08-20-23, and he will follow up with ID on 08-01-23. His renal function has recovered almost back to baseline. He is still anemic, presumably from gastric ulcers. He still has some abdominal pain, so we will increase the Pantoprazole to BID. He will stay off Lasix and Potassium for now. We spent a total of ( 35  ) minutes reviewing records and discussing these issues.  Gershon Crane, MD

## 2023-07-12 ENCOUNTER — Ambulatory Visit
Admission: RE | Admit: 2023-07-12 | Discharge: 2023-07-12 | Discharge status: Home / Self Care | Payer: PPO | Source: Ambulatory Visit | Attending: Radiation Oncology

## 2023-07-12 ENCOUNTER — Other Ambulatory Visit: Payer: Self-pay

## 2023-07-12 DIAGNOSIS — Z510 Encounter for antineoplastic radiation therapy: Secondary | ICD-10-CM | POA: Diagnosis not present

## 2023-07-12 DIAGNOSIS — C678 Malignant neoplasm of overlapping sites of bladder: Secondary | ICD-10-CM | POA: Diagnosis not present

## 2023-07-12 LAB — RAD ONC ARIA SESSION SUMMARY
Course Elapsed Days: 23
Plan Fractions Treated to Date: 17
Plan Prescribed Dose Per Fraction: 1.8 Gy
Plan Total Fractions Prescribed: 28
Plan Total Prescribed Dose: 50.4 Gy
Reference Point Dosage Given to Date: 30.6 Gy
Reference Point Session Dosage Given: 1.8 Gy
Session Number: 17

## 2023-07-13 ENCOUNTER — Ambulatory Visit
Admission: RE | Admit: 2023-07-13 | Discharge: 2023-07-13 | Disposition: A | Discharge status: Home / Self Care | Payer: PPO | Source: Ambulatory Visit | Attending: Radiation Oncology | Admitting: Radiation Oncology

## 2023-07-13 ENCOUNTER — Other Ambulatory Visit: Payer: Self-pay

## 2023-07-13 DIAGNOSIS — Z510 Encounter for antineoplastic radiation therapy: Secondary | ICD-10-CM | POA: Diagnosis not present

## 2023-07-13 DIAGNOSIS — C678 Malignant neoplasm of overlapping sites of bladder: Secondary | ICD-10-CM | POA: Diagnosis not present

## 2023-07-13 LAB — RAD ONC ARIA SESSION SUMMARY
Course Elapsed Days: 24
Plan Fractions Treated to Date: 18
Plan Prescribed Dose Per Fraction: 1.8 Gy
Plan Total Fractions Prescribed: 28
Plan Total Prescribed Dose: 50.4 Gy
Reference Point Dosage Given to Date: 32.4 Gy
Reference Point Session Dosage Given: 1.8 Gy
Session Number: 18

## 2023-07-14 ENCOUNTER — Other Ambulatory Visit: Payer: Self-pay

## 2023-07-14 ENCOUNTER — Ambulatory Visit
Admission: RE | Admit: 2023-07-14 | Discharge: 2023-07-14 | Disposition: A | Discharge status: Home / Self Care | Source: Ambulatory Visit | Attending: Radiation Oncology | Admitting: Radiation Oncology

## 2023-07-14 ENCOUNTER — Ambulatory Visit
Admission: RE | Admit: 2023-07-14 | Discharge: 2023-07-14 | Disposition: A | Discharge status: Home / Self Care | Payer: PPO | Source: Ambulatory Visit | Attending: Radiation Oncology | Admitting: Radiation Oncology

## 2023-07-14 DIAGNOSIS — Z510 Encounter for antineoplastic radiation therapy: Secondary | ICD-10-CM | POA: Diagnosis not present

## 2023-07-14 DIAGNOSIS — C678 Malignant neoplasm of overlapping sites of bladder: Secondary | ICD-10-CM | POA: Diagnosis not present

## 2023-07-14 LAB — RAD ONC ARIA SESSION SUMMARY
Course Elapsed Days: 25
Plan Fractions Treated to Date: 19
Plan Prescribed Dose Per Fraction: 1.8 Gy
Plan Total Fractions Prescribed: 28
Plan Total Prescribed Dose: 50.4 Gy
Reference Point Dosage Given to Date: 34.2 Gy
Reference Point Session Dosage Given: 1.8 Gy
Session Number: 19

## 2023-07-17 ENCOUNTER — Ambulatory Visit
Admission: RE | Admit: 2023-07-17 | Discharge: 2023-07-17 | Disposition: A | Discharge status: Home / Self Care | Payer: PPO | Source: Ambulatory Visit | Attending: Radiation Oncology | Admitting: Radiation Oncology

## 2023-07-17 ENCOUNTER — Other Ambulatory Visit: Payer: Self-pay

## 2023-07-17 DIAGNOSIS — C678 Malignant neoplasm of overlapping sites of bladder: Secondary | ICD-10-CM | POA: Diagnosis not present

## 2023-07-17 DIAGNOSIS — Z510 Encounter for antineoplastic radiation therapy: Secondary | ICD-10-CM | POA: Diagnosis not present

## 2023-07-17 LAB — RAD ONC ARIA SESSION SUMMARY
Course Elapsed Days: 28
Plan Fractions Treated to Date: 20
Plan Prescribed Dose Per Fraction: 1.8 Gy
Plan Total Fractions Prescribed: 28
Plan Total Prescribed Dose: 50.4 Gy
Reference Point Dosage Given to Date: 36 Gy
Reference Point Session Dosage Given: 1.8 Gy
Session Number: 20

## 2023-07-18 ENCOUNTER — Ambulatory Visit
Admission: RE | Admit: 2023-07-18 | Discharge: 2023-07-18 | Disposition: A | Discharge status: Home / Self Care | Payer: PPO | Source: Ambulatory Visit | Attending: Radiation Oncology | Admitting: Radiation Oncology

## 2023-07-18 ENCOUNTER — Other Ambulatory Visit: Payer: Self-pay

## 2023-07-18 DIAGNOSIS — C678 Malignant neoplasm of overlapping sites of bladder: Secondary | ICD-10-CM | POA: Diagnosis not present

## 2023-07-18 DIAGNOSIS — Z510 Encounter for antineoplastic radiation therapy: Secondary | ICD-10-CM | POA: Diagnosis not present

## 2023-07-18 LAB — RAD ONC ARIA SESSION SUMMARY
Course Elapsed Days: 29
Plan Fractions Treated to Date: 21
Plan Prescribed Dose Per Fraction: 1.8 Gy
Plan Total Fractions Prescribed: 28
Plan Total Prescribed Dose: 50.4 Gy
Reference Point Dosage Given to Date: 37.8 Gy
Reference Point Session Dosage Given: 1.8 Gy
Session Number: 21

## 2023-07-19 ENCOUNTER — Ambulatory Visit
Admission: RE | Admit: 2023-07-19 | Discharge: 2023-07-19 | Disposition: A | Discharge status: Home / Self Care | Payer: PPO | Source: Ambulatory Visit | Attending: Radiation Oncology

## 2023-07-19 ENCOUNTER — Other Ambulatory Visit: Payer: Self-pay

## 2023-07-19 DIAGNOSIS — Z510 Encounter for antineoplastic radiation therapy: Secondary | ICD-10-CM | POA: Diagnosis not present

## 2023-07-19 DIAGNOSIS — C678 Malignant neoplasm of overlapping sites of bladder: Secondary | ICD-10-CM | POA: Diagnosis not present

## 2023-07-19 LAB — RAD ONC ARIA SESSION SUMMARY
Course Elapsed Days: 30
Plan Fractions Treated to Date: 22
Plan Prescribed Dose Per Fraction: 1.8 Gy
Plan Total Fractions Prescribed: 28
Plan Total Prescribed Dose: 50.4 Gy
Reference Point Dosage Given to Date: 39.6 Gy
Reference Point Session Dosage Given: 1.8 Gy
Session Number: 22

## 2023-07-20 ENCOUNTER — Ambulatory Visit
Admission: RE | Admit: 2023-07-20 | Discharge: 2023-07-20 | Disposition: A | Discharge status: Home / Self Care | Payer: PPO | Source: Ambulatory Visit | Attending: Radiation Oncology

## 2023-07-20 ENCOUNTER — Other Ambulatory Visit: Payer: Self-pay

## 2023-07-20 DIAGNOSIS — Z510 Encounter for antineoplastic radiation therapy: Secondary | ICD-10-CM | POA: Diagnosis not present

## 2023-07-20 DIAGNOSIS — C678 Malignant neoplasm of overlapping sites of bladder: Secondary | ICD-10-CM | POA: Diagnosis not present

## 2023-07-20 DIAGNOSIS — C775 Secondary and unspecified malignant neoplasm of intrapelvic lymph nodes: Secondary | ICD-10-CM | POA: Diagnosis not present

## 2023-07-20 LAB — RAD ONC ARIA SESSION SUMMARY
Course Elapsed Days: 31
Plan Fractions Treated to Date: 23
Plan Prescribed Dose Per Fraction: 1.8 Gy
Plan Total Fractions Prescribed: 28
Plan Total Prescribed Dose: 50.4 Gy
Reference Point Dosage Given to Date: 41.4 Gy
Reference Point Session Dosage Given: 1.8 Gy
Session Number: 23

## 2023-07-21 ENCOUNTER — Ambulatory Visit
Admission: RE | Admit: 2023-07-21 | Discharge: 2023-07-21 | Disposition: A | Discharge status: Home / Self Care | Source: Ambulatory Visit | Attending: Radiation Oncology | Admitting: Radiation Oncology

## 2023-07-21 ENCOUNTER — Other Ambulatory Visit: Payer: Self-pay | Admitting: Family Medicine

## 2023-07-21 ENCOUNTER — Ambulatory Visit
Admission: RE | Admit: 2023-07-21 | Discharge: 2023-07-21 | Disposition: A | Discharge status: Home / Self Care | Payer: PPO | Source: Ambulatory Visit | Attending: Radiation Oncology | Admitting: Radiation Oncology

## 2023-07-21 ENCOUNTER — Other Ambulatory Visit: Payer: Self-pay

## 2023-07-21 ENCOUNTER — Encounter: Payer: Self-pay | Admitting: Family Medicine

## 2023-07-21 DIAGNOSIS — C678 Malignant neoplasm of overlapping sites of bladder: Secondary | ICD-10-CM | POA: Diagnosis not present

## 2023-07-21 DIAGNOSIS — F411 Generalized anxiety disorder: Secondary | ICD-10-CM

## 2023-07-21 DIAGNOSIS — Z510 Encounter for antineoplastic radiation therapy: Secondary | ICD-10-CM | POA: Diagnosis not present

## 2023-07-21 LAB — RAD ONC ARIA SESSION SUMMARY
Course Elapsed Days: 32
Plan Fractions Treated to Date: 24
Plan Prescribed Dose Per Fraction: 1.8 Gy
Plan Total Fractions Prescribed: 28
Plan Total Prescribed Dose: 50.4 Gy
Reference Point Dosage Given to Date: 43.2 Gy
Reference Point Session Dosage Given: 1.8 Gy
Session Number: 24

## 2023-07-24 ENCOUNTER — Other Ambulatory Visit: Payer: Self-pay | Admitting: Family Medicine

## 2023-07-24 ENCOUNTER — Ambulatory Visit
Admission: RE | Admit: 2023-07-24 | Discharge: 2023-07-24 | Disposition: A | Discharge status: Home / Self Care | Payer: PPO | Source: Ambulatory Visit | Attending: Radiation Oncology | Admitting: Radiation Oncology

## 2023-07-24 ENCOUNTER — Other Ambulatory Visit: Payer: Self-pay

## 2023-07-24 DIAGNOSIS — I129 Hypertensive chronic kidney disease with stage 1 through stage 4 chronic kidney disease, or unspecified chronic kidney disease: Secondary | ICD-10-CM | POA: Diagnosis not present

## 2023-07-24 DIAGNOSIS — C678 Malignant neoplasm of overlapping sites of bladder: Secondary | ICD-10-CM | POA: Diagnosis not present

## 2023-07-24 DIAGNOSIS — E785 Hyperlipidemia, unspecified: Secondary | ICD-10-CM | POA: Diagnosis not present

## 2023-07-24 DIAGNOSIS — C679 Malignant neoplasm of bladder, unspecified: Secondary | ICD-10-CM | POA: Diagnosis not present

## 2023-07-24 DIAGNOSIS — E669 Obesity, unspecified: Secondary | ICD-10-CM | POA: Diagnosis not present

## 2023-07-24 DIAGNOSIS — N179 Acute kidney failure, unspecified: Secondary | ICD-10-CM | POA: Diagnosis not present

## 2023-07-24 DIAGNOSIS — Z510 Encounter for antineoplastic radiation therapy: Secondary | ICD-10-CM | POA: Diagnosis not present

## 2023-07-24 LAB — RAD ONC ARIA SESSION SUMMARY
Course Elapsed Days: 35
Plan Fractions Treated to Date: 25
Plan Prescribed Dose Per Fraction: 1.8 Gy
Plan Total Fractions Prescribed: 28
Plan Total Prescribed Dose: 50.4 Gy
Reference Point Dosage Given to Date: 45 Gy
Reference Point Session Dosage Given: 1.8 Gy
Session Number: 25

## 2023-07-24 NOTE — Telephone Encounter (Signed)
 Pantoprazole prescription sent on 07/11/23 with 5 refills. Called and spoke with [facility] pharmacy staff, Amy. Confirmed medication was picked up today by patient and refills are available for future use. Cancelled reorder, closing encounter.

## 2023-07-24 NOTE — Telephone Encounter (Signed)
 Copied from CRM [id number]. Topic: Clinical - Medication Refill >> Jul 24, 2023 10:15 AM Cammy Copa D wrote: Most Recent Primary Care Visit:  Provider: Gershon Crane A  Department: [facility]  Visit Type: HOSPITAL FOLLOW UP  Date: 07/11/2023  Medication: pantoprazole (PROTONIX) 40 MG tablet  Has the patient contacted their pharmacy? Yes (Agent: If no, request that the patient contact the pharmacy for the refill. If patient does not wish to contact the pharmacy document the reason why and proceed with request.) (Agent: If yes, when and what did the pharmacy advise?)  Is this the correct pharmacy for this prescription? Yes If no, delete pharmacy and type the correct one.  This is the patient's preferred pharmacy:   [phone] [place] - [address] [place] [address] Phone: [phone] Fax: [phone]   Has the prescription been filled recently? Yes  Is the patient out of the medication? Yes  Has the patient been seen for an appointment in the last year OR does the patient have an upcoming appointment? Yes  Can we respond through MyChart? Yes  Agent: Please be advised that Rx refills may take up to 3 business days. We ask that you follow-up with your pharmacy.

## 2023-07-25 ENCOUNTER — Ambulatory Visit
Admission: RE | Admit: 2023-07-25 | Discharge: 2023-07-25 | Disposition: A | Discharge status: Home / Self Care | Payer: PPO | Source: Ambulatory Visit | Attending: Radiation Oncology | Admitting: Radiation Oncology

## 2023-07-25 ENCOUNTER — Other Ambulatory Visit: Payer: Self-pay

## 2023-07-25 DIAGNOSIS — C678 Malignant neoplasm of overlapping sites of bladder: Secondary | ICD-10-CM | POA: Insufficient documentation

## 2023-07-25 DIAGNOSIS — Z510 Encounter for antineoplastic radiation therapy: Secondary | ICD-10-CM | POA: Insufficient documentation

## 2023-07-25 LAB — RAD ONC ARIA SESSION SUMMARY
Course Elapsed Days: 36
Plan Fractions Treated to Date: 26
Plan Prescribed Dose Per Fraction: 1.8 Gy
Plan Total Fractions Prescribed: 28
Plan Total Prescribed Dose: 50.4 Gy
Reference Point Dosage Given to Date: 46.8 Gy
Reference Point Session Dosage Given: 1.8 Gy
Session Number: 26

## 2023-07-25 LAB — LAB REPORT - SCANNED
Albumin, Urine POC: 165.2
Albumin/Creatinine Ratio, Urine, POC: 403
Creatinine, POC: 41 mg/dL
EGFR: 46

## 2023-07-26 ENCOUNTER — Ambulatory Visit
Admission: RE | Admit: 2023-07-26 | Discharge: 2023-07-26 | Disposition: A | Discharge status: Home / Self Care | Source: Ambulatory Visit | Attending: Radiation Oncology

## 2023-07-26 ENCOUNTER — Other Ambulatory Visit: Payer: Self-pay

## 2023-07-26 ENCOUNTER — Ambulatory Visit: Payer: PPO

## 2023-07-26 DIAGNOSIS — C678 Malignant neoplasm of overlapping sites of bladder: Secondary | ICD-10-CM | POA: Diagnosis not present

## 2023-07-26 DIAGNOSIS — Z510 Encounter for antineoplastic radiation therapy: Secondary | ICD-10-CM | POA: Diagnosis not present

## 2023-07-26 LAB — RAD ONC ARIA SESSION SUMMARY
Course Elapsed Days: 37
Plan Fractions Treated to Date: 27
Plan Prescribed Dose Per Fraction: 1.8 Gy
Plan Total Fractions Prescribed: 28
Plan Total Prescribed Dose: 50.4 Gy
Reference Point Dosage Given to Date: 48.6 Gy
Reference Point Session Dosage Given: 1.8 Gy
Session Number: 27

## 2023-07-27 ENCOUNTER — Ambulatory Visit

## 2023-07-27 ENCOUNTER — Ambulatory Visit
Admission: RE | Admit: 2023-07-27 | Discharge: 2023-07-27 | Disposition: A | Discharge status: Home / Self Care | Source: Ambulatory Visit | Attending: Radiation Oncology

## 2023-07-27 ENCOUNTER — Other Ambulatory Visit: Payer: Self-pay

## 2023-07-27 DIAGNOSIS — C678 Malignant neoplasm of overlapping sites of bladder: Secondary | ICD-10-CM | POA: Diagnosis not present

## 2023-07-27 DIAGNOSIS — Z510 Encounter for antineoplastic radiation therapy: Secondary | ICD-10-CM | POA: Diagnosis not present

## 2023-07-27 LAB — RAD ONC ARIA SESSION SUMMARY
Course Elapsed Days: 38
Plan Fractions Treated to Date: 28
Plan Prescribed Dose Per Fraction: 1.8 Gy
Plan Total Fractions Prescribed: 28
Plan Total Prescribed Dose: 50.4 Gy
Reference Point Dosage Given to Date: 50.4 Gy
Reference Point Session Dosage Given: 1.8 Gy
Session Number: 28

## 2023-07-28 NOTE — Radiation Completion Notes (Addendum)
  Radiation Oncology         [phone] ________________________________  Name: Luis [name] MRN: [id number]  Date: 07/27/2023  DOB: 27-Dec-1955  Referring Physician: Amparo Balk, M.D. Date of Service: 2023-07-28 Radiation Oncologist: Bartholome Ligas, M.D. [facility]     RADIATION ONCOLOGY END OF TREATMENT NOTE     Diagnosis: : 68 y/o man with locally advanced, pT4aN1, high grade invasive urothelial cell carcinoma of the bladder, s/p cystoprostatectomy 03/03/2023   Intent: Curative     ==========DELIVERED PLANS==========  First Treatment Date: 2023-06-19 Last Treatment Date: 2023-07-27   Plan Name: Blad_PostOp Site: Bladder Technique: 3D Mode: Photon Dose Per Fraction: 1.8 Gy Prescribed Dose (Delivered / Prescribed): 50.4 Gy / 50.4 Gy Prescribed Fxs (Delivered / Prescribed): 28 / 28     ==========ON TREATMENT VISIT DATES========== 2023-06-23, 2023-06-30, 2023-07-07, 2023-07-14, 2023-07-21, 2023-07-27    See weekly On Treatment Notes in Epic for details in the Media tab (listed as Progress notes on the On Treatment Visit Dates listed above).  He tolerated the treatments well with modest fatigue and mild diarrhea.  The patient will receive a call in about one month from the radiation oncology department. He will continue follow up with his medical oncologist, Dr. Rosaline Coma and his urologist, Dr. Secundino Dach, as well.  ------------------------------------------------   Kenith Payer, MD [facility]  Radiation Oncology Direct Dial: [phone]  Fax: [phone] [email or web address]  Skype  LinkedIn

## 2023-08-01 ENCOUNTER — Ambulatory Visit (INDEPENDENT_AMBULATORY_CARE_PROVIDER_SITE_OTHER): Payer: Self-pay | Admitting: Infectious Diseases

## 2023-08-01 ENCOUNTER — Other Ambulatory Visit: Payer: Self-pay

## 2023-08-01 VITALS — BP 127/78 | HR 55 | Temp 98.0°F | Ht 66.0 in | Wt 250.0 lb

## 2023-08-01 DIAGNOSIS — R7881 Bacteremia: Secondary | ICD-10-CM | POA: Diagnosis not present

## 2023-08-01 DIAGNOSIS — Z95828 Presence of other vascular implants and grafts: Secondary | ICD-10-CM | POA: Diagnosis not present

## 2023-08-01 DIAGNOSIS — Z79899 Other long term (current) drug therapy: Secondary | ICD-10-CM | POA: Insufficient documentation

## 2023-08-01 NOTE — Progress Notes (Signed)
 Patient Active Problem List   Diagnosis Date Noted   Iron deficiency anemia due to chronic blood loss 07/11/2023   Gastric ulcer with hemorrhage 07/11/2023   SIRS (systemic inflammatory response syndrome) (HCC) 06/27/2023   Acute on chronic anemia 06/27/2023   Renal dysfunction 06/27/2023   Port-A-Cath in place 05/30/2023   History of bladder cancer 04/18/2023   Sepsis (HCC) 04/18/2023   AKI (acute kidney injury) (HCC) 04/18/2023   History of aortic valve replacement with tissue graft 04/15/2023   Carrier of multidrug-resistant Stenotrophomonas maltophilia 04/15/2023   Enterococcus faecalis infection 04/14/2023   Bacteremia 04/14/2023   History of prosthetic aortic valve 04/14/2023   Shock (HCC) 04/13/2023   Bladder cancer (HCC) 07/29/2022   S/P pericardial window creation 09/30/2021   Pericardial effusion 09/28/2021   Syncope    PAF (paroxysmal atrial fibrillation) (HCC)    S/P AVR 09/16/2021   Plantar fasciitis of left foot 08/19/2021   Acquired cavus deformity of foot 08/19/2021   Morbid obesity (HCC) 02/29/2016   Essential hypertension    Ascending aortic aneurysm (HCC)    Rhinitis, chronic 01/14/2015   Generalized anxiety disorder 01/14/2015   Aortic stenosis, moderate 01/02/2015   Chronic diastolic CHF (congestive heart failure) (HCC) 06/30/2014   OSA (obstructive sleep apnea) 02/13/2012   Fatigue 01/09/2012   Gall bladder disease 06/16/2011   Hyperlipidemia 02/28/2011   Angina of effort (HCC) 12/08/2010   CAD (coronary artery disease) 12/08/2010   Tobacco abuse 12/08/2010   Aortic stenosis 12/08/2010   ANAL FISSURE 04/23/2009   Internal hemorrhoids 05/22/2008   COLONIC POLYPS 02/07/2008   CAROTID ARTERY STENOSIS 02/07/2008    Patient's Medications  New Prescriptions   No medications on file  Previous Medications   ACETAMINOPHEN (TYLENOL) 325 MG TABLET    Take 650 mg by mouth every 6 (six) hours as needed (for pain).   AMLODIPINE (NORVASC) 5 MG TABLET     Take 1 tablet (5 mg total) by mouth daily.   ASPIRIN EC 81 MG TABLET    Take 81 mg by mouth in the morning.   CARVEDILOL (COREG) 3.125 MG TABLET    TAKE 1 TABLET BY MOUTH TWICE A DAY WITH FOOD   CYANOCOBALAMIN (VITAMIN B12) 1000 MCG TABLET    Take 1 tablet (1,000 mcg total) by mouth daily.   DIAZEPAM (VALIUM) 5 MG TABLET    TAKE 1 TABLET BY MOUTH EVERY 12 HOURS AS NEEDED FOR ANXIETY.   FERROUS SULFATE 325 (65 FE) MG EC TABLET    Take 1 tablet (325 mg total) by mouth every other day.   FUROSEMIDE (LASIX) 20 MG TABLET    Take 1 tablet (20 mg total) by mouth daily.   IPRATROPIUM (ATROVENT) 0.03 % NASAL SPRAY    Place 2 sprays into both nostrils every 12 (twelve) hours as needed for rhinitis.   LIDOCAINE-PRILOCAINE (EMLA) CREAM    Apply 1 Application topically as needed.   MAGNESIUM 500 MG TABS    Take 500 mg by mouth in the morning.   MECLIZINE (ANTIVERT) 25 MG TABLET    Take 1 tablet (25 mg total) by mouth 3 (three) times daily as needed for dizziness.   MULTIVITAMIN-IRON-MINERALS-FOLIC ACID (CENTRUM) CHEWABLE TABLET    Chew 1 tablet by mouth daily.   NITROGLYCERIN (NITROSTAT) 0.4 MG SL TABLET    Place 0.4 mg under the tongue every 5 (five) minutes as needed for chest pain.   ONDANSETRON (ZOFRAN) 8 MG TABLET    Take 1 tablet (8  mg total) by mouth every 8 (eight) hours as needed.   PANTOPRAZOLE (PROTONIX) 40 MG TABLET    Take 1 tablet (40 mg total) by mouth 2 (two) times daily before a meal.   POTASSIUM CHLORIDE SA (KLOR-CON M20) 20 MEQ TABLET    Take 1 tablet (20 mEq total) by mouth daily.   PROCHLORPERAZINE (COMPAZINE) 10 MG TABLET    Take 1 tablet (10 mg total) by mouth every 6 (six) hours as needed for nausea or vomiting.   ROSUVASTATIN (CRESTOR) 10 MG TABLET    Take 1 tablet (10 mg total) by mouth at bedtime. Reduce your dose of crestor to 10 mg daily.  Follow with your PCP outpatient to follow your kidney function to determine if this dose reduction should be long term.   SODIUM CHLORIDE FLUSH  (NS) 0.9 % SOLN    Flush catheter slowly with 5ml as needed if output decreases.   VENLAFAXINE XR (EFFEXOR-XR) 150 MG 24 HR CAPSULE    TAKE 1 CAPSULE BY MOUTH DAILY WITH BREAKFAST.  Modified Medications   No medications on file  Discontinued Medications   No medications on file    Subjective: 68 year old male with prior history of aortic stenosis, ascending aortic aneurysm s/p bioprosthetic AVR with aortic graft replacement, Pericardial effusion s/p windown, bladder CA s/p * cycles of Pembrolizumab, radical cystoprostatectomy with ileal conduit in 02/2023 with node positive disease s/p 1 cycle of gem/cis held due to elevated Cr and receiving radiation prior to admission,  CAD s/p PCI, carotid stenosis, CHF, HTN, HLD, prediabetes, obesity, depression, E faecalis bacteremia  in 03/2023 s/p treatment who is here for HFU for Klebsiella oxytoca bacteremia. He was discharged on 3/16 to complete a course of PO augmentin, EOT 07/09/23.   08/01/23 Accompanied by wife. Reports completing augmentin as prescribed. Denies missing doses or concerns. Seen by his PCP on 3/18 and has a fu with Urology on 4/27. Cr from 3/18 down to 1.74. Reports completing Radiation therapy and starting chemotherapy soon. No other complaints.   Review of Systems: all systems reviewed with pertinent positives and negatives as listed above. Denies fevers, chills. Denies nausea, vomiting or diarrhea.   Past Medical History:  Diagnosis Date   Anxiety    takes Valium as needed   Aortic stenosis, moderate    Arthritis    Ascending aortic aneurysm (HCC)    CAD (coronary artery disease)    a. s/p PCI of the RCA 8/12 with DES by Dr Excell Seltzer, preserved EF. b. LHC/RHC (2/16) with mean RA 12, PA 32/15, mean PCWP 18, CI 3.47; patent mid and distal RCA stents, 50-60% proximal stenosis small PDA.      Cancer [facility])    bladder   Carotid stenosis    a. Carotid US (05/2013):  Bilateral 1-39% ICA; L thyroid nodule (prior hx of aspiration).    Chronic diastolic CHF (congestive heart failure) (HCC)    Complication of anesthesia    difficulty waking up after gallbladder surgery   Depression    Dyspnea    Essential hypertension    GERD (gastroesophageal reflux disease)    if needed will take OTC meds    Heart murmur    History of colonic polyps    hyperplastic   Hyperlipidemia    Joint pain    Lesion of bladder    Myocardial infarction (HCC) 2012   Obesity (BMI 30-39.9) 02/29/2016   Pre-diabetes    Restless leg    Sleep apnea    uses  cpap   Tubular adenoma of colon    Vertigo    takes Meclizine as needed   Past Surgical History:  Procedure Laterality Date   AORTIC VALVE REPLACEMENT N/A 09/16/2021   Procedure: AORTIC VALVE REPLACEMENT (AVR);  Surgeon: Alleen Borne, MD;  Location: [facility] OR;  Service: Open Heart Surgery;  Laterality: N/A;   CATARACT EXTRACTION   4 YRS AGO   BOTH EYES   CHOLECYSTECTOMY  07/21/2011   Procedure: LAPAROSCOPIC CHOLECYSTECTOMY WITH INTRAOPERATIVE CHOLANGIOGRAM;  Surgeon: Kandis Cocking, MD;  Location: [facility] ORS;  Service: General;  Laterality: N/A;   CORONARY ANGIOPLASTY  2012   2 stents   coronary stenting     s/p PCI of the RCA by Dr Excell Seltzer 8/12 with 2 promus stents   CYSTOSCOPY W/ RETROGRADES Bilateral 12/13/2019   Procedure: CYSTOSCOPY WITH RETROGRADE PYELOGRAM;  Surgeon: Sebastian Ache, MD;  Location: [facility];  Service: Urology;  Laterality: Bilateral;   CYSTOSCOPY W/ RETROGRADES Bilateral 10/14/2020   Procedure: CYSTOSCOPY WITH RETROGRADE PYELOGRAM;  Surgeon: Sebastian Ache, MD;  Location: [facility];  Service: Urology;  Laterality: Bilateral;   CYSTOSCOPY W/ RETROGRADES Bilateral 12/16/2020   Procedure: CYSTOSCOPY WITH RETROGRADE PYELOGRAM;  Surgeon: Sebastian Ache, MD;  Location[facility];  Service: Urology;  Laterality: Bilateral;   CYSTOSCOPY W/ RETROGRADES Bilateral 06/03/2022   Procedure: CYSTOSCOPY WITH RETROGRADE PYELOGRAM;   Surgeon: Sebastian Ache, MD;  Location: [facility];  Service: Urology;  Laterality: Bilateral;   CYSTOSCOPY W/ RETROGRADES Bilateral 12/28/2022   Procedure: CYSTOSCOPY WITH RETROGRADE PYELOGRAM, FULGARATION OF BLEEDERS;  Surgeon: Loletta Parish., MD;  Location: [facility];  Service: Urology;  Laterality: Bilateral;   CYSTOSCOPY WITH INJECTION N/A 03/03/2023   Procedure: CYSTOSCOPY WITH INDOCYANINE INJECTION;  Surgeon: Loletta Parish., MD;  Location: [facility];  Service: Urology;  Laterality: N/A;  360 MINUTES   ESOPHAGOGASTRODUODENOSCOPY N/A 06/28/2023   Procedure: EGD (ESOPHAGOGASTRODUODENOSCOPY);  Surgeon: Lynann Bologna, MD;  Location: [facility];  Service: Gastroenterology;  Laterality: N/A;   IR IMAGING GUIDED PORT INSERTION  05/29/2023   IR NEPHROSTOMY PLACEMENT RIGHT  07/01/2023   IR RADIOLOGY PERIPHERAL GUIDED IV START  07/01/2023   IR US GUIDE VASC ACCESS LEFT  07/01/2023   LEFT AND RIGHT HEART CATHETERIZATION WITH CORONARY ANGIOGRAM N/A 06/23/2014   Procedure: LEFT AND RIGHT HEART CATHETERIZATION WITH CORONARY ANGIOGRAM;  Surgeon: Laurey Morale, MD;  Location: [facility] CATH LAB;  Service: Cardiovascular;  Laterality: N/A;   NECK SURGERY  03/23/09   per Dr. Ophelia Charter, cervical fusion    PERICARDIOCENTESIS N/A 09/28/2021   Procedure: PERICARDIOCENTESIS;  Surgeon: Corky Crafts, MD;  Location: [facility] INVASIVE CV LAB;  Service: Cardiovascular;  Laterality: N/A;   REPLACEMENT ASCENDING AORTA N/A 09/16/2021   Procedure: REPLACEMENT ASCENDING AORTA WITH 30 X HEMASHIELD PLATINUM WOVEN DOUBLE VELOUR VASCULAR GRAFT;  Surgeon: Alleen Borne, MD;  Location: [facility] OR;  Service: Open Heart Surgery;  Laterality: N/A;  CIRC ARREST   right elbow surgery     RIGHT HEART CATH N/A 06/15/2023   Procedure: RIGHT HEART CATH;  Surgeon: Laurey Morale, MD;  Location: [facility] INVASIVE CV LAB;  Service: Cardiovascular;  Laterality: N/A;   RIGHT HEART CATH AND CORONARY ANGIOGRAPHY N/A 07/08/2021   Procedure: RIGHT HEART CATH AND  CORONARY ANGIOGRAPHY;  Surgeon: Laurey Morale, MD;  Location[facility] INVASIVE CV LAB;  Service: Cardiovascular;  Laterality: N/A;   ROBOT ASSISTED LAPAROSCOPIC COMPLETE CYSTECT ILEAL CONDUIT N/A 03/03/2023  Procedure: XI ROBOTIC ASSISTED LAPAROSCOPIC COMPLETE CYSTECTECTOMY WITH  ILEAL CONDUIT DIVERSION;  Surgeon: Loletta Parish., MD;  Location: [facility];  Service: Urology;  Laterality: N/A;   ROBOT ASSISTED LAPAROSCOPIC RADICAL PROSTATECTOMY N/A 03/03/2023   Procedure: XI ROBOTIC ASSISTED LAPAROSCOPIC RADICAL PROSTATECTOMY WITH LYMPH NODE DISSECTION;  Surgeon: Loletta Parish., MD;  Location: [facility];  Service: Urology;  Laterality: N/A;   solonscopy  05/23/08   per Dr. Celene Kras hemorrhoids only, repeat in 5 years   SUBXYPHOID PERICARDIAL WINDOW N/A 09/28/2021   Procedure: SUBXYPHOID PERICARDIAL WINDOW;  Surgeon: Alleen Borne, MD;  Location: [facility] OR;  Service: Thoracic;  Laterality: N/A;   TEE WITHOUT CARDIOVERSION N/A 06/23/2014   Procedure: TRANSESOPHAGEAL ECHOCARDIOGRAM (TEE);  Surgeon: Laurey Morale, MD;  Location: [facility] ENDOSCOPY;  Service: Cardiovascular;  Laterality: N/A;   TEE WITHOUT CARDIOVERSION N/A 01/21/2016   Procedure: TRANSESOPHAGEAL ECHOCARDIOGRAM (TEE);  Surgeon: Laurey Morale, MD;  Location: [facility] ENDOSCOPY;  Service: Cardiovascular;  Laterality: N/A;   TEE WITHOUT CARDIOVERSION N/A 09/16/2021   Procedure: TRANSESOPHAGEAL ECHOCARDIOGRAM (TEE);  Surgeon: Alleen Borne, MD;  Location: [facility] OR;  Service: Open Heart Surgery;  Laterality: N/A;   TEE WITHOUT CARDIOVERSION N/A 09/28/2021   Procedure: TRANSESOPHAGEAL ECHOCARDIOGRAM (TEE);  Surgeon: Alleen Borne, MD;  Location: [facility] OR;  Service: Thoracic;  Laterality: N/A;   TONSILLECTOMY     TRANSESOPHAGEAL ECHOCARDIOGRAM (CATH LAB) N/A 04/17/2023   Procedure: TRANSESOPHAGEAL ECHOCARDIOGRAM;  Surgeon: Jake Bathe, MD;  Location: [facility] INVASIVE CV LAB;  Service: Cardiovascular;  Laterality: N/A;   TRANSURETHRAL RESECTION OF  BLADDER TUMOR N/A 10/21/2019   Procedure: TRANSURETHRAL RESECTION OF BLADDER TUMOR (TURBT);  Surgeon: Ihor Gully, MD;  Location: [facility];  Service: Urology;  Laterality: N/A;   TRANSURETHRAL RESECTION OF BLADDER TUMOR N/A 12/13/2019   Procedure: TRANSURETHRAL RESECTION OF BLADDER TUMOR (TURBT);  Surgeon: Sebastian Ache, MD;  Location: [facility];  Service: Urology;  Laterality: N/A;  1 HR   TRANSURETHRAL RESECTION OF BLADDER TUMOR N/A 10/14/2020   Procedure: TRANSURETHRAL RESECTION OF BLADDER TUMOR (TURBT);  Surgeon: Sebastian Ache, MD;  Location: [facility];  Service: Urology;  Laterality: N/A;   TRANSURETHRAL RESECTION OF BLADDER TUMOR N/A 12/16/2020   Procedure: RESTAGING TRANSURETHRAL RESECTION OF BLADDER TUMOR (TURBT);  Surgeon: Sebastian Ache, MD;  Location: [facility];  Service: Urology;  Laterality: N/A;   TRANSURETHRAL RESECTION OF BLADDER TUMOR N/A 06/03/2022   Procedure: TRANSURETHRAL RESECTION OF BLADDER TUMOR (TURBT);  Surgeon: Sebastian Ache, MD;  Location: [facility];  Service: Urology;  Laterality: N/A;   UMBILICAL HERNIA REPAIR  03/03/2023   Procedure: HERNIA REPAIR UMBILICAL;  Surgeon: Loletta Parish., MD;  Location: [facility];  Service: Urology;;     Social History   Tobacco Use   Smoking status: Former    Current packs/day: 0.00    Average packs/day: 2.0 packs/day for 40.0 years (80.0 ttl pk-yrs)    Types: Cigarettes    Start date: 49    Quit date: 2012    Years since quitting: 13.2    Passive exposure: Never   Smokeless tobacco: Never  Vaping Use   Vaping status: Never Used  Substance Use Topics   Alcohol use: No    Alcohol/week: 0.0 standard drinks of alcohol   Drug use: No    Family History  Problem Relation Age of Onset   Lung cancer Mother        lung   Esophageal  cancer Cousin    Colon cancer Neg Hx    Rectal cancer Neg Hx    Stomach cancer Neg Hx     No Known Allergies  Health  Maintenance  Topic Date Due   Hepatitis C Screening  Never done   DTaP/Tdap/Td (1 - Tdap) Never done   Zoster Vaccines- Shingrix (1 of 2) Never done   COVID-19 Vaccine (4 - 2024-25 season) 12/25/2022   Medicare Annual Wellness (AWV)  11/03/2023   INFLUENZA VACCINE  11/24/2023   Lung Cancer Screening  05/15/2024   Diabetic kidney evaluation - eGFR measurement  07/24/2024   Diabetic kidney evaluation - Urine ACR  07/24/2024   Colonoscopy  10/03/2025   Pneumonia Vaccine 36+ Years old  Completed   HPV VACCINES  Aged Out    Objective:  There were no vitals filed for this visit. There is no height or weight on file to calculate BMI.  Physical Exam Constitutional:      Appearance: Normal appearance.  HENT:     Head: Normocephalic and atraumatic.      Mouth: Mucous membranes are moist.  Eyes:    Conjunctiva/sclera: Conjunctivae normal.     Pupils:   Cardiovascular:     Rate and Rhythm: Normal rate and Irregular rhythm.     Heart sounds:   Pulmonary:     Effort: Pulmonary effort is normal.     Breath sounds:   Abdominal:     General: Non distended     Palpations: soft. Bilateral nephrostomy tube with no concerns   Musculoskeletal:        General: Normal range of motion.   Skin:    General: Skin is warm and dry.     Comments: Rt chest port-a-cath with no concerns  Neurological:     General: grossly non focal     Mental Status: awake, alert and oriented to person, place, and time.   Psychiatric:        Mood and Affect: Mood normal.   Lab Results Lab Results  Component Value Date   WBC 6.4 07/11/2023   HGB 8.6 (L) 07/11/2023   HCT 26.4 (L) 07/11/2023   MCV 86.6 07/11/2023   PLT 140 (L) 07/11/2023    Lab Results  Component Value Date   CREATININE 1.74 (H) 07/11/2023   BUN 19 07/11/2023   NA 138 07/11/2023   K 3.7 07/11/2023   CL 108 07/11/2023   CO2 24 07/11/2023    Lab Results  Component Value Date   ALT 23 07/11/2023   AST 18 07/11/2023   ALKPHOS  119 07/11/2023   BILITOT 0.5 07/11/2023    Lab Results  Component Value Date   CHOL 155 01/17/2022   HDL 57 01/17/2022   LDLCALC 71 01/17/2022   LDLDIRECT 145.6 03/16/2007   TRIG 136 01/17/2022   CHOLHDL 2.7 01/17/2022   No results found for: "LABRPR", "RPRTITER"  Assessment/Plan # h/o prior E faecalis bacteremia in December 2024 s/p 4 weeks of IV ampicillin, EOT 05/12/2023 with negative surviellance blood cultures   # Klebsiella oxytoca bacteremia: Possible GU source. - s/p completion of abtx course, no signs of port infection and left in place  Plan - Blood cultures *2 sets as surveillance  # Rhino virus  - resolved     # AKI on CKD - Cr improving to 1.74 - Fu with Urology      # Anemia  - Evaluated by GI, s/p EGD with nonbleeding gastric ulcers without stigmata of  bleeding, biopsied( path unremarkable for malaignancy or H pylori)   # Bladder cancer status cystoprostatectomy with ileal conduit s/p bilateral nephrostomy - Follows Dr. [name]  I have personally spent 30 minutes involved in face-to-face and non-face-to-face activities for this patient on the day of the visit. Professional time spent includes the following activities: Preparing to see the patient (review of tests), Obtaining and/or reviewing separately obtained history (admission/discharge record), Performing a medically appropriate examination and/or evaluation , Ordering medications/tests/procedures, referring and communicating with other health care professionals, Documenting clinical information in the EMR, Independently interpreting results (not separately reported), Communicating results to the patient/family/caregiver, Counseling and educating the patient/family/caregiver and Care coordination (not separately reported).   Of note, portions of this note may have been created with voice recognition software. While this note has been edited for accuracy, occasional wrong-word or 'sound-a-like' substitutions may  have occurred due to the inherent limitations of voice recognition software.   Victoriano Lain, MD [facility] 08/01/2023, 11:15 AM

## 2023-08-06 ENCOUNTER — Encounter: Payer: Self-pay | Admitting: Family Medicine

## 2023-08-06 LAB — CULTURE, BLOOD (SINGLE)
MICRO NUMBER:: 16303695
MICRO NUMBER:: 16303696

## 2023-08-07 ENCOUNTER — Other Ambulatory Visit (HOSPITAL_COMMUNITY): Payer: Self-pay

## 2023-08-07 ENCOUNTER — Telehealth: Payer: Self-pay

## 2023-08-07 ENCOUNTER — Ambulatory Visit (HOSPITAL_COMMUNITY)
Admission: RE | Admit: 2023-08-07 | Discharge: 2023-08-07 | Disposition: A | Discharge status: Home / Self Care | Source: Ambulatory Visit

## 2023-08-07 DIAGNOSIS — Z936 Other artificial openings of urinary tract status: Secondary | ICD-10-CM

## 2023-08-07 DIAGNOSIS — R509 Fever, unspecified: Secondary | ICD-10-CM

## 2023-08-07 DIAGNOSIS — Z436 Encounter for attention to other artificial openings of urinary tract: Secondary | ICD-10-CM | POA: Insufficient documentation

## 2023-08-07 DIAGNOSIS — N289 Disorder of kidney and ureter, unspecified: Secondary | ICD-10-CM

## 2023-08-07 HISTORY — PX: IR NEPHROSTOMY EXCHANGE LEFT: IMG6069

## 2023-08-07 MED ORDER — LIDOCAINE HCL 1 % IJ SOLN
20.0000 mL | Freq: Once | INTRAMUSCULAR | Status: AC
  Administered 2023-08-07: 10 mL via INTRADERMAL

## 2023-08-07 MED ORDER — LIDOCAINE HCL 1 % IJ SOLN
INTRAMUSCULAR | Status: AC
  Filled 2023-08-07: qty 20

## 2023-08-07 MED ORDER — IOHEXOL 300 MG/ML  SOLN
50.0000 mL | Freq: Once | INTRAMUSCULAR | Status: AC | PRN
  Administered 2023-08-07: 10 mL

## 2023-08-07 NOTE — Telephone Encounter (Signed)
-----   Message from Melvina Stage sent at 08/07/2023  6:55 AM EDT ----- Triage,   Please let him know blood cultures are negative and nothing to do. His port can stay in.

## 2023-08-08 ENCOUNTER — Other Ambulatory Visit: Payer: Self-pay

## 2023-08-08 ENCOUNTER — Other Ambulatory Visit: Payer: Self-pay | Admitting: Physician Assistant

## 2023-08-08 ENCOUNTER — Telehealth (HOSPITAL_COMMUNITY): Payer: Self-pay

## 2023-08-08 DIAGNOSIS — C679 Malignant neoplasm of bladder, unspecified: Secondary | ICD-10-CM

## 2023-08-08 NOTE — Telephone Encounter (Signed)
-----   Message from Nurse Emer C sent at 06/20/2023 10:14 AM EST ----- Regarding: PA FOR INFUSION Need PA orders are in

## 2023-08-08 NOTE — Telephone Encounter (Signed)
 Patient has been scheduled for an iron infusion on 4/21 at 12pm. Patient needs to report the entrance A then he will be escorted to the infusion clinic. No answer, Left message to return call.

## 2023-08-09 ENCOUNTER — Inpatient Hospital Stay

## 2023-08-09 ENCOUNTER — Inpatient Hospital Stay: Payer: PPO | Admitting: Physician Assistant

## 2023-08-09 ENCOUNTER — Inpatient Hospital Stay: Payer: PPO | Attending: Hematology and Oncology

## 2023-08-09 ENCOUNTER — Telehealth: Payer: Self-pay

## 2023-08-09 ENCOUNTER — Other Ambulatory Visit: Payer: Self-pay

## 2023-08-09 VITALS — BP 131/60 | HR 63 | Temp 98.1°F | Resp 18 | Ht 66.0 in | Wt 254.4 lb

## 2023-08-09 DIAGNOSIS — R109 Unspecified abdominal pain: Secondary | ICD-10-CM | POA: Diagnosis not present

## 2023-08-09 DIAGNOSIS — R7989 Other specified abnormal findings of blood chemistry: Secondary | ICD-10-CM | POA: Diagnosis not present

## 2023-08-09 DIAGNOSIS — D649 Anemia, unspecified: Secondary | ICD-10-CM | POA: Diagnosis not present

## 2023-08-09 DIAGNOSIS — E538 Deficiency of other specified B group vitamins: Secondary | ICD-10-CM | POA: Diagnosis not present

## 2023-08-09 DIAGNOSIS — C679 Malignant neoplasm of bladder, unspecified: Secondary | ICD-10-CM

## 2023-08-09 DIAGNOSIS — D508 Other iron deficiency anemias: Secondary | ICD-10-CM | POA: Diagnosis not present

## 2023-08-09 DIAGNOSIS — N179 Acute kidney failure, unspecified: Secondary | ICD-10-CM | POA: Diagnosis not present

## 2023-08-09 DIAGNOSIS — E876 Hypokalemia: Secondary | ICD-10-CM | POA: Diagnosis not present

## 2023-08-09 DIAGNOSIS — C671 Malignant neoplasm of dome of bladder: Secondary | ICD-10-CM | POA: Insufficient documentation

## 2023-08-09 DIAGNOSIS — C673 Malignant neoplasm of anterior wall of bladder: Secondary | ICD-10-CM | POA: Insufficient documentation

## 2023-08-09 LAB — CMP (CANCER CENTER ONLY)
ALT: 21 U/L (ref 0–44)
AST: 23 U/L (ref 15–41)
Albumin: 3.8 g/dL (ref 3.5–5.0)
Alkaline Phosphatase: 88 U/L (ref 38–126)
Anion gap: 8 (ref 5–15)
BUN: 15 mg/dL (ref 8–23)
CO2: 25 mmol/L (ref 22–32)
Calcium: 8.4 mg/dL — ABNORMAL LOW (ref 8.9–10.3)
Chloride: 106 mmol/L (ref 98–111)
Creatinine: 1.53 mg/dL — ABNORMAL HIGH (ref 0.61–1.24)
GFR, Estimated: 49 mL/min — ABNORMAL LOW (ref >60)
Glucose, Bld: 92 mg/dL (ref 70–99)
Potassium: 3.2 mmol/L — ABNORMAL LOW (ref 3.5–5.1)
Sodium: 139 mmol/L (ref 135–145)
Total Bilirubin: 0.7 mg/dL (ref 0.0–1.2)
Total Protein: 8 g/dL (ref 6.5–8.1)

## 2023-08-09 LAB — CBC WITH DIFFERENTIAL (CANCER CENTER ONLY)
Abs Immature Granulocytes: 0.1 10*3/uL — ABNORMAL HIGH (ref 0.00–0.07)
Basophils Absolute: 0 10*3/uL (ref 0.0–0.1)
Basophils Relative: 0 %
Eosinophils Absolute: 0.3 10*3/uL (ref 0.0–0.5)
Eosinophils Relative: 4 %
HCT: 23 % — ABNORMAL LOW (ref 39.0–52.0)
Hemoglobin: 7.3 g/dL — ABNORMAL LOW (ref 13.0–17.0)
Immature Granulocytes: 2 %
Lymphocytes Relative: 8 %
Lymphs Abs: 0.5 10*3/uL — ABNORMAL LOW (ref 0.7–4.0)
MCH: 29.4 pg (ref 26.0–34.0)
MCHC: 31.7 g/dL (ref 30.0–36.0)
MCV: 92.7 fL (ref 80.0–100.0)
Monocytes Absolute: 0.5 10*3/uL (ref 0.1–1.0)
Monocytes Relative: 8 %
Neutro Abs: 4.7 10*3/uL (ref 1.7–7.7)
Neutrophils Relative %: 78 %
Platelet Count: 142 10*3/uL — ABNORMAL LOW (ref 150–400)
RBC: 2.48 MIL/uL — ABNORMAL LOW (ref 4.22–5.81)
RDW: 18.1 % — ABNORMAL HIGH (ref 11.5–15.5)
WBC Count: 6.1 10*3/uL (ref 4.0–10.5)
nRBC: 0 % (ref 0.0–0.2)

## 2023-08-09 LAB — SAMPLE TO BLOOD BANK

## 2023-08-09 LAB — VITAMIN B12: Vitamin B-12: 282 pg/mL (ref 180–914)

## 2023-08-09 LAB — IRON AND IRON BINDING CAPACITY (CC-WL,HP ONLY)
Iron: 23 ug/dL — ABNORMAL LOW (ref 45–182)
Saturation Ratios: 10 % — ABNORMAL LOW (ref 17.9–39.5)
TIBC: 230 ug/dL — ABNORMAL LOW (ref 250–450)
UIBC: 207 ug/dL (ref 117–376)

## 2023-08-09 LAB — FERRITIN: Ferritin: 465 ng/mL — ABNORMAL HIGH (ref 24–336)

## 2023-08-09 LAB — PREPARE RBC (CROSSMATCH)

## 2023-08-09 LAB — FOLATE: Folate: 11.9 ng/mL (ref >5.9)

## 2023-08-09 MED ORDER — HEPARIN SOD (PORK) LOCK FLUSH 100 UNIT/ML IV SOLN
500.0000 [IU] | Freq: Once | INTRAVENOUS | Status: AC
  Administered 2023-08-09: 500 [IU] via INTRAVENOUS

## 2023-08-09 MED ORDER — TRAMADOL HCL 50 MG PO TABS: 50.0000 mg | ORAL_TABLET | Freq: Four times a day (QID) | ORAL | 0 refills | Status: AC | PRN

## 2023-08-09 MED ORDER — SODIUM CHLORIDE 0.9% FLUSH
10.0000 mL | INTRAVENOUS | Status: DC | PRN
  Administered 2023-08-09: 10 mL via INTRAVENOUS

## 2023-08-09 NOTE — Telephone Encounter (Signed)
 Copied from CRM [id number]. Topic: Clinical - Prescription Issue >> Aug 09, 2023  3:47 PM [place] A wrote: Reason for CRM: Patient's wife is calling to find out about rosuvastatin calcium. She sent a mychart message on 08/07/23  because hospital changed dosage and patient is out of medication

## 2023-08-10 ENCOUNTER — Encounter: Payer: Self-pay | Admitting: Physician Assistant

## 2023-08-10 ENCOUNTER — Encounter: Payer: Self-pay | Admitting: Hematology and Oncology

## 2023-08-10 NOTE — Progress Notes (Addendum)
 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 Rosaline Coma  08/12/2022: Cycle 1 of Pembrolizumab 09/02/2022: Cycle 2 of Pembrolizumab  09/23/2022: Cycle 3 of Pembrolizumab 10/14/2022: Cycle 4 of Pembrolizumab 11/04/2022: Cycle 5 of Pembrolizumab 11/25/2022: Cycle 6 of Pembrolizumab 12/20/2022: Cycle 7 of Pembrolizumab 01/13/2023: Cycle 8 of Pembrolizumab  03/03/2023: Cystectomy/Prostatectomy showed infiltrative high-grade urothelial carcinoma, size 5.8 cm involving bladder dome, anterior wall and prostatic stroma. Tumor invades directly into prostatic stroma at apex and mid portion of the gland (pT4a). Metastatic urothelial cancer in one of two left common iliac lymph nodes.  06/19/2023-07/27/2023: Received adjuvant radiation to surgical bed.  He received 50.4 Gray in 28 fractions.  CHIEF COMPLAINTS/PURPOSE OF CONSULTATION:  "High Risk Non-Muscle Invasive Bladder Cancer "  HISTORY OF PRESENTING ILLNESS:  Luis [name] 68 y.o. 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  He was  last seen by Dr. Rosaline Coma on 06/13/2023.  In the interim, he was admitted for sepsis secondary to Klebsiella bacteremia along with mild bilateral hydroureteronephrosis.  He underwent bilateral nephrostomy placement as well and recently had an exchange on 08/07/2023.  Additionally, he completed adjuvant radiation on 07/27/2023.  On exam today Mr. Marling reports he is doing okay at this time.  He does have ongoing fatigue which impacts his ADLs.  He has a poor appetite mainly due to taste changes and early satiety.  He denies nausea or vomiting episodes.  He does have occasional diarrhea.  He reports having bilateral flank pain secondary to bilateral nephrostomy placement.  He is taking Tylenol at this time with minimal relief.  He adds that he has difficulty with sleep due to flank pain with the nephrostomy placement.  He did notice some blood in the nephrostomy bag shortly after the exchange but that has dissipated.  He denies fevers, chills, sweats, shortness of breath, chest pain, cough, headaches, dizziness or peripheral edema.He has no other complaints. A full 10 point ROS is otherwise negative.  MEDICAL HISTORY:  Past Medical History:  Diagnosis Date   Anxiety    takes Valium as needed   Aortic stenosis, moderate    Arthritis    Ascending aortic aneurysm (HCC)    CAD (coronary artery disease)    a. s/p PCI of the RCA 8/12 with DES by Dr Arlester Ladd, preserved EF. b. LHC/RHC (2/16) with mean RA 12, PA 32/15, mean PCWP 18, CI 3.47; patent mid and distal RCA stents, 50-60% proximal stenosis small PDA.      Cancer [facility])    bladder   Carotid stenosis    a. Carotid US  (05/2013):  Bilateral 1-39% ICA; L thyroid nodule (prior hx of aspiration).   Chronic diastolic CHF (congestive heart failure) (HCC)  Complication of anesthesia    difficulty waking up after gallbladder surgery   Depression    Dyspnea    Essential hypertension    GERD (gastroesophageal reflux disease)    if needed will take OTC meds     Heart murmur    History of colonic polyps    hyperplastic   Hyperlipidemia    Joint pain    Lesion of bladder    Myocardial infarction (HCC) 2012   Obesity (BMI 30-39.9) 02/29/2016   Pre-diabetes    Restless leg    Sleep apnea    uses cpap   Tubular adenoma of colon    Vertigo    takes Meclizine as needed    SURGICAL HISTORY: Past Surgical History:  Procedure Laterality Date   AORTIC VALVE REPLACEMENT N/A 09/16/2021   Procedure: AORTIC VALVE REPLACEMENT (AVR);  Surgeon: Alleen Borne, MD;  Location: [facility] OR;  Service: Open Heart Surgery;  Laterality: N/A;   CATARACT EXTRACTION   4 YRS AGO   BOTH EYES   CHOLECYSTECTOMY  07/21/2011   Procedure: LAPAROSCOPIC CHOLECYSTECTOMY WITH INTRAOPERATIVE CHOLANGIOGRAM;  Surgeon: Kandis Cocking, MD;  Location: [facility] ORS;  Service: General;  Laterality: N/A;   CORONARY ANGIOPLASTY  2012   2 stents   coronary stenting     s/p PCI of the RCA by Dr Excell Seltzer 8/12 with 2 promus stents   CYSTOSCOPY W/ RETROGRADES Bilateral 12/13/2019   Procedure: CYSTOSCOPY WITH RETROGRADE PYELOGRAM;  Surgeon: Sebastian Ache, MD;  Location: [facility];  Service: Urology;  Laterality: Bilateral;   CYSTOSCOPY W/ RETROGRADES Bilateral 10/14/2020   Procedure: CYSTOSCOPY WITH RETROGRADE PYELOGRAM;  Surgeon: Sebastian Ache, MD;  Location: [facility];  Service: Urology;  Laterality: Bilateral;   CYSTOSCOPY W/ RETROGRADES Bilateral 12/16/2020   Procedure: CYSTOSCOPY WITH RETROGRADE PYELOGRAM;  Surgeon: Sebastian Ache, MD;  Location: [facility];  Service: Urology;  Laterality: Bilateral;   CYSTOSCOPY W/ RETROGRADES Bilateral 06/03/2022   Procedure: CYSTOSCOPY WITH RETROGRADE PYELOGRAM;  Surgeon: Sebastian Ache, MD;  Location: [facility];  Service: Urology;  Laterality: Bilateral;   CYSTOSCOPY W/ RETROGRADES Bilateral 12/28/2022   Procedure: CYSTOSCOPY WITH RETROGRADE PYELOGRAM, FULGARATION OF BLEEDERS;  Surgeon: Loletta Parish., MD;   Location: [facility];  Service: Urology;  Laterality: Bilateral;   CYSTOSCOPY WITH INJECTION N/A 03/03/2023   Procedure: CYSTOSCOPY WITH INDOCYANINE INJECTION;  Surgeon: Loletta Parish., MD;  Location: [facility];  Service: Urology;  Laterality: N/A;  360 MINUTES   ESOPHAGOGASTRODUODENOSCOPY N/A 06/28/2023   Procedure: EGD (ESOPHAGOGASTRODUODENOSCOPY);  Surgeon: Lynann Bologna, MD;  Location: [facility];  Service: Gastroenterology;  Laterality: N/A;   IR IMAGING GUIDED PORT INSERTION  05/29/2023   IR NEPHROSTOMY EXCHANGE LEFT  08/07/2023   IR NEPHROSTOMY PLACEMENT RIGHT  07/01/2023   IR RADIOLOGY PERIPHERAL GUIDED IV START  07/01/2023   IR US GUIDE VASC ACCESS LEFT  07/01/2023   LEFT AND RIGHT HEART CATHETERIZATION WITH CORONARY ANGIOGRAM N/A 06/23/2014   Procedure: LEFT AND RIGHT HEART CATHETERIZATION WITH CORONARY ANGIOGRAM;  Surgeon: Laurey Morale, MD;  Location: [facility] CATH LAB;  Service: Cardiovascular;  Laterality: N/A;   NECK SURGERY  03/23/09   per Dr. Ophelia Charter, cervical fusion    PERICARDIOCENTESIS N/A 09/28/2021   Procedure: PERICARDIOCENTESIS;  Surgeon: Corky Crafts, MD;  Location: [facility] INVASIVE CV LAB;  Service: Cardiovascular;  Laterality: N/A;   REPLACEMENT ASCENDING AORTA N/A 09/16/2021   Procedure: REPLACEMENT ASCENDING AORTA WITH 30 X HEMASHIELD PLATINUM WOVEN DOUBLE VELOUR  VASCULAR GRAFT;  Surgeon: Bartley Lightning, MD;  Location: [facility] OR;  Service: Open Heart Surgery;  Laterality: N/A;  CIRC ARREST   right elbow surgery     RIGHT HEART CATH N/A 06/15/2023   Procedure: RIGHT HEART CATH;  Surgeon: Darlis Eisenmenger, MD;  Location: [facility] INVASIVE CV LAB;  Service: Cardiovascular;  Laterality: N/A;   RIGHT HEART CATH AND CORONARY ANGIOGRAPHY N/A 07/08/2021   Procedure: RIGHT HEART CATH AND CORONARY ANGIOGRAPHY;  Surgeon: Darlis Eisenmenger, MD;  Location: [facility] INVASIVE CV LAB;  Service: Cardiovascular;  Laterality: N/A;   ROBOT ASSISTED LAPAROSCOPIC COMPLETE CYSTECT ILEAL CONDUIT N/A 03/03/2023    Procedure: XI ROBOTIC ASSISTED LAPAROSCOPIC COMPLETE CYSTECTECTOMY WITH  ILEAL CONDUIT DIVERSION;  Surgeon: Melody Spurling., MD;  Location: [facility];  Service: Urology;  Laterality: N/A;   ROBOT ASSISTED LAPAROSCOPIC RADICAL PROSTATECTOMY N/A 03/03/2023   Procedure: XI ROBOTIC ASSISTED LAPAROSCOPIC RADICAL PROSTATECTOMY WITH LYMPH NODE DISSECTION;  Surgeon: Melody Spurling., MD;  Location: [facility];  Service: Urology;  Laterality: N/A;   solonscopy  05/23/08   per Dr. Baruch Bosch hemorrhoids only, repeat in 5 years   SUBXYPHOID PERICARDIAL WINDOW N/A 09/28/2021   Procedure: SUBXYPHOID PERICARDIAL WINDOW;  Surgeon: Bartley Lightning, MD;  Location: [facility] OR;  Service: Thoracic;  Laterality: N/A;   TEE WITHOUT CARDIOVERSION N/A 06/23/2014   Procedure: TRANSESOPHAGEAL ECHOCARDIOGRAM (TEE);  Surgeon: Darlis Eisenmenger, MD;  Location: [facility] ENDOSCOPY;  Service: Cardiovascular;  Laterality: N/A;   TEE WITHOUT CARDIOVERSION N/A 01/21/2016   Procedure: TRANSESOPHAGEAL ECHOCARDIOGRAM (TEE);  Surgeon: Darlis Eisenmenger, MD;  Location: [facility] ENDOSCOPY;  Service: Cardiovascular;  Laterality: N/A;   TEE WITHOUT CARDIOVERSION N/A 09/16/2021   Procedure: TRANSESOPHAGEAL ECHOCARDIOGRAM (TEE);  Surgeon: Bartley Lightning, MD;  Location: [facility] OR;  Service: Open Heart Surgery;  Laterality: N/A;   TEE WITHOUT CARDIOVERSION N/A 09/28/2021   Procedure: TRANSESOPHAGEAL ECHOCARDIOGRAM (TEE);  Surgeon: Bartley Lightning, MD;  Location: [facility] OR;  Service: Thoracic;  Laterality: N/A;   TONSILLECTOMY     TRANSESOPHAGEAL ECHOCARDIOGRAM (CATH LAB) N/A 04/17/2023   Procedure: TRANSESOPHAGEAL ECHOCARDIOGRAM;  Surgeon: Hugh Madura, MD;  Location: [facility] INVASIVE CV LAB;  Service: Cardiovascular;  Laterality: N/A;   TRANSURETHRAL RESECTION OF BLADDER TUMOR N/A 10/21/2019   Procedure: TRANSURETHRAL RESECTION OF BLADDER TUMOR (TURBT);  Surgeon: Ottelin, Mark, MD;  Location: [facility];  Service: Urology;  Laterality: N/A;    TRANSURETHRAL RESECTION OF BLADDER TUMOR N/A 12/13/2019   Procedure: TRANSURETHRAL RESECTION OF BLADDER TUMOR (TURBT);  Surgeon: Osborn Blaze, MD;  Location: [facility];  Service: Urology;  Laterality: N/A;  1 HR   TRANSURETHRAL RESECTION OF BLADDER TUMOR N/A 10/14/2020   Procedure: TRANSURETHRAL RESECTION OF BLADDER TUMOR (TURBT);  Surgeon: Osborn Blaze, MD;  Location: [facility];  Service: Urology;  Laterality: N/A;   TRANSURETHRAL RESECTION OF BLADDER TUMOR N/A 12/16/2020   Procedure: RESTAGING TRANSURETHRAL RESECTION OF BLADDER TUMOR (TURBT);  Surgeon: Osborn Blaze, MD;  Location: [facility];  Service: Urology;  Laterality: N/A;   TRANSURETHRAL RESECTION OF BLADDER TUMOR N/A 06/03/2022   Procedure: TRANSURETHRAL RESECTION OF BLADDER TUMOR (TURBT);  Surgeon: Osborn Blaze, MD;  Location: [facility];  Service: Urology;  Laterality: N/A;   UMBILICAL HERNIA REPAIR  03/03/2023   Procedure: HERNIA REPAIR UMBILICAL;  Surgeon: Melody Spurling., MD;  Location: [facility];  Service: Urology;;    SOCIAL HISTORY: Social History   Socioeconomic History   Marital status: Married  Spouse name: Not on file   Number of children: 4   Years of education: Not on file   Highest education level: Not on file  Occupational History   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  Tobacco Use   Smoking status: Former    Current packs/day: 0.00    Average packs/day: 2.0 packs/day for 40.0 years (80.0 ttl pk-yrs)    Types: Cigarettes    Start date: 73    Quit date: 2012    Years since quitting: 13.3    Passive exposure: Never   Smokeless tobacco: Never  Vaping Use   Vaping status: Never Used  Substance and Sexual Activity   Alcohol use: No    Alcohol/week: 0.0 standard drinks of alcohol   Drug use: No   Sexual activity: Not on file  Other Topics Concern   Not on file  Social History Narrative   Not on file   Social  Drivers of Health   Financial Resource Strain: Low Risk  (11/03/2022)   Overall Financial Resource Strain (CARDIA)    Difficulty of Paying Living Expenses: Not hard at all  Food Insecurity: No Food Insecurity (07/04/2023)   Hunger Vital Sign    Worried About Running Out of Food in the Last Year: Never true    Ran Out of Food in the Last Year: Never true  Transportation Needs: No Transportation Needs (07/04/2023)   PRAPARE - Administrator, Civil Service (Medical): No    Lack of Transportation (Non-Medical): No  Physical Activity: Unknown (11/03/2022)   Exercise Vital Sign    Days of Exercise per Week: Patient unable to answer    Minutes of Exercise per Session: 30 min  Stress: No Stress Concern Present (11/03/2022)   [employer] of Occupational Health - Occupational Stress Questionnaire    Feeling of Stress : Not at all  Social Connections: Moderately Isolated (06/27/2023)   Social Connection and Isolation Panel [NHANES]    Frequency of Communication with Friends and Family: More than three times a week    Frequency of Social Gatherings with Friends and Family: More than three times a week    Attends Religious Services: Never    Database administrator or Organizations: No    Attends Banker Meetings: Never    Marital Status: Married  Catering manager Violence: Not At Risk (07/04/2023)   Humiliation, Afraid, Rape, and Kick questionnaire    Fear of Current or Ex-Partner: No    Emotionally Abused: No    Physically Abused: No    Sexually Abused: No    FAMILY HISTORY: Family History  Problem Relation Age of Onset   Lung cancer Mother        lung   Esophageal cancer Cousin    Colon cancer Neg Hx    Rectal cancer Neg Hx    Stomach cancer Neg Hx     ALLERGIES:  has no known allergies.  MEDICATIONS:  Current Outpatient Medications  Medication Sig Dispense Refill   acetaminophen (TYLENOL) 325 MG tablet Take 650 mg by mouth every 6 (six) hours as needed  (for pain).     amLODipine (NORVASC) 5 MG tablet Take 1 tablet (5 mg total) by mouth daily. 90 tablet 1   aspirin EC 81 MG tablet Take 81 mg by mouth in the morning.     carvedilol (COREG) 3.125 MG tablet TAKE 1 TABLET BY MOUTH TWICE A DAY WITH FOOD 180 tablet 1  cyanocobalamin (VITAMIN B12) 1000 MCG tablet Take 1 tablet (1,000 mcg total) by mouth daily. 30 tablet 6   diazepam (VALIUM) 5 MG tablet TAKE 1 TABLET BY MOUTH EVERY 12 HOURS AS NEEDED FOR ANXIETY. 60 tablet 5   ferrous sulfate 325 (65 FE) MG EC tablet Take 1 tablet (325 mg total) by mouth every other day. 15 tablet 1   ipratropium (ATROVENT) 0.03 % nasal spray Place 2 sprays into both nostrils every 12 (twelve) hours as needed for rhinitis.     lidocaine-prilocaine (EMLA) cream Apply 1 Application topically as needed. 30 g 0   Magnesium 500 MG TABS Take 500 mg by mouth in the morning.     meclizine (ANTIVERT) 25 MG tablet Take 1 tablet (25 mg total) by mouth 3 (three) times daily as needed for dizziness. 30 tablet 0   multivitamin-iron-minerals-folic acid (CENTRUM) chewable tablet Chew 1 tablet by mouth daily.     nitroGLYCERIN (NITROSTAT) 0.4 MG SL tablet Place 0.4 mg under the tongue every 5 (five) minutes as needed for chest pain.     ondansetron (ZOFRAN) 8 MG tablet Take 1 tablet (8 mg total) by mouth every 8 (eight) hours as needed. 30 tablet 0   pantoprazole (PROTONIX) 40 MG tablet Take 1 tablet (40 mg total) by mouth 2 (two) times daily before a meal. 60 tablet 5   prochlorperazine (COMPAZINE) 10 MG tablet Take 1 tablet (10 mg total) by mouth every 6 (six) hours as needed for nausea or vomiting. 30 tablet 0   sodium chloride flush (NS) 0.9 % SOLN Flush catheter slowly with 5ml as needed if output decreases. 300 mL 2   traMADol (ULTRAM) 50 MG tablet Take 1 tablet (50 mg total) by mouth every 6 (six) hours as needed. 30 tablet 0   venlafaxine XR (EFFEXOR-XR) 150 MG 24 hr capsule TAKE 1 CAPSULE BY MOUTH DAILY WITH BREAKFAST. 90  capsule 0   [Paused] furosemide (LASIX) 20 MG tablet Take 1 tablet (20 mg total) by mouth daily. (Patient not taking: Reported on 08/01/2023) 45 tablet 3   [Paused] potassium chloride SA (KLOR-CON M20) 20 MEQ tablet Take 1 tablet (20 mEq total) by mouth daily. (Patient not taking: Reported on 08/01/2023)     rosuvastatin (CRESTOR) 10 MG tablet Take 1 tablet (10 mg total) by mouth at bedtime. Reduce your dose of crestor to 10 mg daily.  Follow with your PCP outpatient to follow your kidney function to determine if this dose reduction should be long term. 30 tablet 0   No current facility-administered medications for this visit.    REVIEW OF SYSTEMS:   Constitutional: ( - ) fevers, ( - )  chills , ( - ) night sweats Eyes: ( - ) blurriness of vision, ( - ) double vision, ( - ) watery eyes Ears, nose, mouth, throat, and face: ( - ) mucositis, ( - ) sore throat Respiratory: ( - ) cough, ( - ) dyspnea, ( - ) wheezes Cardiovascular: ( - ) palpitation, ( - ) chest discomfort, ( - ) lower extremity swelling Gastrointestinal:  ( - ) nausea, ( - ) heartburn, ( - ) change in bowel habits Skin: ( - ) abnormal skin rashes Lymphatics: ( - ) new lymphadenopathy, ( - ) easy bruising Neurological: ( - ) numbness, ( - ) tingling, ( - ) new weaknesses Behavioral/Psych: ( - ) mood change, ( - ) new changes  All other systems were reviewed with the patient and are negative.  PHYSICAL EXAMINATION: ECOG PERFORMANCE STATUS: 1 - Symptomatic but completely ambulatory  Vitals:   08/09/23 1305  BP: 131/60  Pulse: 63  Resp: 18  Temp: 98.1 F (36.7 C)  SpO2: 100%    Filed Weights   08/09/23 1305  Weight: 254 lb 6.4 oz (115.4 kg)     GENERAL: well appearing elderly Caucasian male in NAD  SKIN: skin color, texture, turgor are normal, no rashes or significant lesions EYES: conjunctiva are pink and non-injected, sclera clear LUNGS: clear to auscultation and percussion with normal breathing effort HEART: regular  rate & rhythm and no murmurs and no lower extremity edema Musculoskeletal: no cyanosis of digits and no clubbing.  PSYCH: alert & oriented x 3, fluent speech NEURO: no focal motor/sensory deficits  LABORATORY DATA:  I have reviewed the data as listed    Latest Ref Rng & Units 08/09/2023   12:22 PM 07/11/2023    9:31 AM 07/03/2023    5:07 PM  CBC  WBC 4.0 - 10.5 K/uL 6.1  6.4    Hemoglobin 13.0 - 17.0 g/dL 7.3  8.6  7.3   Hematocrit 39.0 - 52.0 % 23.0  26.4  23.7   Platelets 150 - 400 K/uL 142  140         Latest Ref Rng & Units 08/09/2023   12:22 PM 07/11/2023    9:31 AM 07/03/2023    3:50 AM  CMP  Glucose 70 - 99 mg/dL 92  161  096   BUN 8 - 23 mg/dL 15  19  36   Creatinine 0.61 - 1.24 mg/dL 0.45  4.09  8.11   Sodium 135 - 145 mmol/L 139  138  135   Potassium 3.5 - 5.1 mmol/L 3.2  3.7  4.1   Chloride 98 - 111 mmol/L 106  108  108   CO2 22 - 32 mmol/L 25  24  20    Calcium 8.9 - 10.3 mg/dL 8.4  8.5  7.7   Total Protein 6.5 - 8.1 g/dL 8.0  8.0    Total Bilirubin 0.0 - 1.2 mg/dL 0.7  0.5    Alkaline Phos 38 - 126 U/L 88  119    AST 15 - 41 U/L 23  18    ALT 0 - 44 U/L 21  23      RADIOGRAPHIC STUDIES: IR NEPHROSTOMY EXCHANGE BILATERAL Result Date: 08/07/2023 INDICATION: History of bladder cancer and cystectomy. Bilateral nephrostomy tubes placed on 07/01/2023 due to acute renal failure and bilateral hydronephrosis. Patient presents for routine nephrostomy tube exchange. EXAM: EXCHANGE OF BILATERAL NEPHROSTOMY TUBES WITH FLUOROSCOPY Physician: Rachelle Hora. Lowella Dandy, MD COMPARISON:  None Available. MEDICATIONS: 1% lidocaine for local anesthetic ANESTHESIA/SEDATION: None CONTRAST:  10 mL OMNIPAQUE IOHEXOL 300 MG/ML SOLN - administered into the collecting system(s) FLUOROSCOPY: Radiation Exposure Index (as provided by the fluoroscopic device): 19 mGy Kerma COMPLICATIONS: None immediate. PROCEDURE: The procedure was explained to the patient. The risks and benefits of the procedure were discussed  and the patient's questions were addressed. Informed consent was obtained from the patient. Patient was placed prone. Both flanks were prepped and draped in sterile fashion. Maximal barrier sterile technique was utilized including caps, mask, sterile gowns, sterile gloves, sterile drape, hand hygiene and skin antiseptic. Contrast was injected through the left nephrostomy tube. Nephrostomy tube was cut and removed over a wire. New 10 French multipurpose drain was advanced over the wire and reconstituted in the renal pelvis. Contrast injection confirmed placement in the renal pelvis.  Skin was anesthetized with 1% lidocaine. Left nephrostomy tube was sutured to skin and attached to a gravity bag. Contrast was injected through the right nephrostomy tube. Nephrostomy tube was cut and removed over a wire. New 10 French multipurpose drain was advanced over the wire and reconstituted in the renal pelvis. Contrast injection confirmed placement in the renal pelvis. Skin was anesthetized with 1% lidocaine. Right nephrostomy tube was sutured to skin and attached to a gravity bag. Fluoroscopic images were taken and saved for this procedure. FINDINGS: Left nephrostomy tube is reconstituted in left renal pelvis. Right nephrostomy tube is reconstituted in the right renal pelvis. IMPRESSION: Successful exchange of bilateral nephrostomy tubes with fluoroscopy. Electronically Signed   By: Richarda Overlie M.D.   On: 08/07/2023 18:24    ASSESSMENT & PLAN MARCHELLO ROTHGEB 68 y.o. 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  After review of the labs, review of the records, and discussion with the patient the patients findings are most consistent with BCG unresponsive high risk non-muscle invasive bladder cancer, not a candidate for cystectomy.  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 21 days until progression or intolerance up to 24 months. --Received 8 cycles of Pembrolizumab on 08/12/2022-01/13/2023: Cycle 8 of Pembrolizumab  --On 03/03/2023, underwent Cystectomy/Prostatectomy showed infiltrative high-grade urothelial carcinoma, size 5.8 cm involving bladder dome, anterior wall and prostatic stroma. Tumor invades directly into prostatic stroma at apex and mid portion of the gland (pT4a). Metastatic urothelial cancer in one of two left common iliac lymph nodes.  -- Given the results of the pathology showing positive margins and spread to the lymph node we are considering radiation therapy versus chemotherapy.  For chemotherapy, recommend gemcitabine and cisplatin x 4 cycles.  This would consist of gemcitabine 1000 mg/m on days 1 and 8 and cisplatin 70 mg/m on day 1 of 21-day cycle for 4 cycles. -- Due to kidney dysfunction and anemia, chemotherapy was deferred and patient underwent adjuvant radiation to the surgical bed from 06/19/2023 until 07/27/2023.  He received 50.4 Gray in 28 fractions. PLAN:  --Labs from today were reviewed with patient.  WBC 6.1, hemoglobin 7.3, platelets 142, creatinine 1.53, LFTs normal. -- Due to persistent anemia with a hemoglobin less than 8, patient will receive 1 unit of PRBC this week. -- Dr. [name] recommends reassessing for chemotherapy eligibility in 2 weeks as long as kidney function will continue to improve. --RTC in 2 weeks with labs and follow up with Dr. [name]  # Anemia --Hemoglobin is 7.3 today. --May be multifactorial due to nutritional deficiency as well as kidney dysfunction. -- Nutritional labs checked today show borderline vitamin B12 deficiency and iron deficiency. -- Patient is currently taking vitamin B12 p.o. daily and ferrous sulfate every other day. -- Recommend to give IV monoferric and B12 IM injections to bolster levels.  #Hypokalemia: --Potassium level is 3.2  today --Patient is currently not taking potassium supplement. Advised to resume.   #Flank pain: --Secondary to bilateral nephrostomy. Patient is currently taking tylenol with minimal improvement.  --Sent prescription of tramadol 50 mg PO q 6 hours as needed.   # AKI -- Currently has bilateral nephrostomies, underwent exchange on 08/07/2023. -- Creatinine has improved to 1.53 today (4.27 on 07/02/2023)  #Supportive Care -- chemotherapy education complete -- port placed -- zofran 8mg  q8H PRN and  compazine 10mg  PO q6H for nausea -- EMLA cream for port -- no pain medication required at this time.    Orders Placed This Encounter  Procedures   Ferritin    Standing Status:   Future    Number of Occurrences:   1    Expiration Date:   08/08/2024   Iron and Iron Binding Capacity ([facility] only)    Standing Status:   Future    Number of Occurrences:   1    Expiration Date:   08/08/2024   Vitamin B12    Standing Status:   Future    Number of Occurrences:   1    Expiration Date:   08/08/2024   Methylmalonic acid, serum    Standing Status:   Future    Number of Occurrences:   1    Expiration Date:   08/08/2024   Folate, Serum    Standing Status:   Future    Number of Occurrences:   1    Expiration Date:   08/08/2024   Sample to Blood Bank(Blood Bank Hold)    Standing Status:   Future    Number of Occurrences:   1    Expiration Date:   08/08/2024   All questions were answered. The patient knows to call the clinic with any problems, questions or concerns.  I have spent a total of 30 minutes minutes of face-to-face and non-face-to-face time, preparing to see the patient, performing a medically appropriate examination, counseling and educating the patient, documenting clinical information in the electronic health record,and care coordination.   Wyline Hearing PA-C Dept of Hematology and Oncology [facility] at [facility] Phone: [phone]

## 2023-08-11 ENCOUNTER — Inpatient Hospital Stay

## 2023-08-11 ENCOUNTER — Other Ambulatory Visit: Payer: Self-pay | Admitting: Physician Assistant

## 2023-08-11 DIAGNOSIS — D508 Other iron deficiency anemias: Secondary | ICD-10-CM

## 2023-08-11 DIAGNOSIS — C671 Malignant neoplasm of dome of bladder: Secondary | ICD-10-CM | POA: Diagnosis not present

## 2023-08-11 DIAGNOSIS — C679 Malignant neoplasm of bladder, unspecified: Secondary | ICD-10-CM

## 2023-08-11 MED ORDER — ACETAMINOPHEN 325 MG PO TABS
650.0000 mg | ORAL_TABLET | Freq: Once | ORAL | Status: AC
  Administered 2023-08-11: 650 mg via ORAL
  Filled 2023-08-11: qty 2

## 2023-08-11 MED ORDER — SODIUM CHLORIDE 0.9% FLUSH
10.0000 mL | INTRAVENOUS | Status: AC | PRN
  Administered 2023-08-11: 10 mL

## 2023-08-11 MED ORDER — DIPHENHYDRAMINE HCL 25 MG PO CAPS
25.0000 mg | ORAL_CAPSULE | Freq: Once | ORAL | Status: AC
Start: 2023-08-11 — End: 2023-08-11
  Administered 2023-08-11: 25 mg via ORAL
  Filled 2023-08-11: qty 1

## 2023-08-11 MED ORDER — HEPARIN SOD (PORK) LOCK FLUSH 100 UNIT/ML IV SOLN
500.0000 [IU] | Freq: Every day | INTRAVENOUS | Status: AC | PRN
  Administered 2023-08-11: 500 [IU]

## 2023-08-11 MED ORDER — SODIUM CHLORIDE 0.9% IV SOLUTION
250.0000 mL | INTRAVENOUS | Status: DC
  Administered 2023-08-11: 100 mL via INTRAVENOUS

## 2023-08-11 NOTE — Patient Instructions (Signed)
 Blood Transfusion, Adult A blood transfusion is a procedure in which you receive blood or a type of blood cell (blood component) through an IV. You may need a blood transfusion when you have a low blood count, which is a low number of any blood cell. This may result from a bleeding disorder, illness, injury, or surgery. The blood may come from a donor, or you may be able to have your own blood collected and stored (autologous blood donation) before a planned surgery. The blood given in a transfusion may be made up of different blood components. You may receive: Red blood cells. These carry oxygen to the cells in the body. Platelets. These help your blood to clot. Plasma. This is the liquid part of your blood. It carries proteins and other substances throughout the body. White blood cells. These help you fight infections. If you have hemophilia or another clotting disorder, you may also receive other types of blood products. Depending on the type of blood product, this procedure may take 1-4 hours to complete. Tell a health care provider about: Any bleeding problems you have. Any previous reactions you have had during a blood transfusion. Any allergies you have. All medicines you are taking, including vitamins, herbs, eye drops, creams, and over-the-counter medicines. Any surgeries you have had. Any medical conditions you have. Whether you are pregnant or may be pregnant. What are the risks? Talk with your health care provider about risks. The most common problems include: A mild allergic reaction, such as red, swollen areas of skin (hives) and itching. Fever or chills. This may be the body's response to new blood cells received. This may occur during or up to 4 hours after the transfusion. More serious problems may include: A serious allergic reaction that causes difficulty breathing or swelling around the face and lips. Transfusion-associated circulatory overload (TACO), or too much fluid in  the lungs. This may cause breathing problems. Transfusion-related acute lung injury (TRALI), which causes breathing difficulty and low oxygen in the blood. This can occur within hours of the transfusion or several days later. Iron overload. This can happen after receiving many blood transfusions over a period of time. Infection or virus being transmitted. This is rare because donated blood is carefully tested before it is given. Hemolytic transfusion reaction. This is rare. It happens when the body's defense system (immune system)tries to attack the new blood cells. Symptoms may include fever, chills, nausea, low blood pressure, and low back or chest pain. Transfusion-associated graft-versus-host disease (TAGVHD). This is rare. It happens when donated cells attack the body's healthy tissues. What happens before the procedure? You will have a blood test to check your blood type. This test is done to know what kind of blood your body will accept and to match it to the donor blood. If you are going to have a planned surgery, you may be able to do an autologous blood donation. This may be done in case you need to have a transfusion. You will have your temperature, blood pressure, and pulse checked before the transfusion. If you have had an allergic reaction to a transfusion in the past, you may be given medicine to help prevent a reaction. This medicine may be given to you by mouth (orally) or through an IV. What happens during the procedure?  An IV will be inserted into one of your veins. The bag of blood will be attached to your IV. The blood will then enter through your vein. Your temperature, blood pressure,  and pulse will be monitored during the transfusion. This monitoring is done to detect early signs of a transfusion reaction. Tell your nurse right away if you have any of these symptoms during the transfusion: Shortness of breath or trouble breathing. Chest or back pain. Fever or  chills. Itching or hives. If you have any signs or symptoms of a reaction, your transfusion will be stopped and you may be given medicine. When the transfusion is complete, your IV will be removed. Pressure may be applied to the IV site for a few minutes. A bandage (dressing)will be applied. The procedure may vary among health care providers and hospitals. What happens after the procedure? Your temperature, blood pressure, pulse, breathing rate, and blood oxygen level will be monitored until you leave the hospital or clinic. Your blood may be tested to see how you have responded to the transfusion. You may be warmed with fluids or blankets to maintain a normal body temperature. If you receive your blood transfusion in an outpatient setting, you will be told whom to contact to report any reactions. Where to find more information Visit the [employer]: redcross.org Summary A blood transfusion is a procedure in which you receive blood or a type of blood cell (blood component) through an IV. The blood given in a transfusion may be made up of different blood components. You may receive red blood cells, platelets, plasma, or white blood cells depending on the condition treated. Your temperature, blood pressure, and pulse will be monitored before, during, and after the transfusion. After the transfusion, your blood may be tested to see how your body has responded. This information is not intended to replace advice given to you by your health care provider. Make sure you discuss any questions you have with your health care provider. Document Revised: 07/09/2021 Document Reviewed: 07/09/2021 Elsevier Patient Education  2024 [employer].

## 2023-08-13 LAB — METHYLMALONIC ACID, SERUM: Methylmalonic Acid, Quantitative: 221 nmol/L (ref 0–378)

## 2023-08-14 ENCOUNTER — Telehealth: Payer: Self-pay

## 2023-08-14 ENCOUNTER — Other Ambulatory Visit: Payer: Self-pay

## 2023-08-14 ENCOUNTER — Inpatient Hospital Stay (HOSPITAL_COMMUNITY): Admission: RE | Admit: 2023-08-14 | Source: Ambulatory Visit

## 2023-08-14 ENCOUNTER — Inpatient Hospital Stay

## 2023-08-14 ENCOUNTER — Inpatient Hospital Stay (HOSPITAL_BASED_OUTPATIENT_CLINIC_OR_DEPARTMENT_OTHER): Admitting: Physician Assistant

## 2023-08-14 VITALS — BP 125/67 | HR 67 | Temp 98.7°F | Resp 18

## 2023-08-14 VITALS — BP 137/56 | HR 54 | Temp 98.3°F | Resp 18 | Wt 262.2 lb

## 2023-08-14 DIAGNOSIS — E876 Hypokalemia: Secondary | ICD-10-CM

## 2023-08-14 DIAGNOSIS — L509 Urticaria, unspecified: Secondary | ICD-10-CM

## 2023-08-14 DIAGNOSIS — C671 Malignant neoplasm of dome of bladder: Secondary | ICD-10-CM | POA: Diagnosis not present

## 2023-08-14 DIAGNOSIS — D508 Other iron deficiency anemias: Secondary | ICD-10-CM

## 2023-08-14 DIAGNOSIS — C679 Malignant neoplasm of bladder, unspecified: Secondary | ICD-10-CM

## 2023-08-14 LAB — CBC WITH DIFFERENTIAL (CANCER CENTER ONLY)
Abs Immature Granulocytes: 0.04 10*3/uL (ref 0.00–0.07)
Basophils Absolute: 0 10*3/uL (ref 0.0–0.1)
Basophils Relative: 0 %
Eosinophils Absolute: 0.4 10*3/uL (ref 0.0–0.5)
Eosinophils Relative: 6 %
HCT: 24.6 % — ABNORMAL LOW (ref 39.0–52.0)
Hemoglobin: 8 g/dL — ABNORMAL LOW (ref 13.0–17.0)
Immature Granulocytes: 1 %
Lymphocytes Relative: 5 %
Lymphs Abs: 0.3 10*3/uL — ABNORMAL LOW (ref 0.7–4.0)
MCH: 29.1 pg (ref 26.0–34.0)
MCHC: 32.5 g/dL (ref 30.0–36.0)
MCV: 89.5 fL (ref 80.0–100.0)
Monocytes Absolute: 0.4 10*3/uL (ref 0.1–1.0)
Monocytes Relative: 6 %
Neutro Abs: 5.5 10*3/uL (ref 1.7–7.7)
Neutrophils Relative %: 82 %
Platelet Count: 148 10*3/uL — ABNORMAL LOW (ref 150–400)
RBC: 2.75 MIL/uL — ABNORMAL LOW (ref 4.22–5.81)
RDW: 17.2 % — ABNORMAL HIGH (ref 11.5–15.5)
WBC Count: 6.7 10*3/uL (ref 4.0–10.5)
nRBC: 0 % (ref 0.0–0.2)

## 2023-08-14 LAB — CMP (CANCER CENTER ONLY)
ALT: 13 U/L (ref 0–44)
AST: 12 U/L — ABNORMAL LOW (ref 15–41)
Albumin: 2.9 g/dL — ABNORMAL LOW (ref 3.5–5.0)
Alkaline Phosphatase: 88 U/L (ref 38–126)
Anion gap: 8 (ref 5–15)
BUN: 15 mg/dL (ref 8–23)
CO2: 25 mmol/L (ref 22–32)
Calcium: 7.5 mg/dL — ABNORMAL LOW (ref 8.9–10.3)
Chloride: 107 mmol/L (ref 98–111)
Creatinine: 1.66 mg/dL — ABNORMAL HIGH (ref 0.61–1.24)
GFR, Estimated: 44 mL/min — ABNORMAL LOW (ref >60)
Glucose, Bld: 183 mg/dL — ABNORMAL HIGH (ref 70–99)
Potassium: 2.5 mmol/L — CL (ref 3.5–5.1)
Sodium: 140 mmol/L (ref 135–145)
Total Bilirubin: 0.5 mg/dL (ref 0.0–1.2)
Total Protein: 6.3 g/dL — ABNORMAL LOW (ref 6.5–8.1)

## 2023-08-14 LAB — TYPE AND SCREEN
ABO/RH(D): A NEG
Antibody Screen: NEGATIVE
Unit division: 0

## 2023-08-14 LAB — BPAM RBC
Blood Product Expiration Date: 202505072359
ISSUE DATE / TIME: 202504181052
Unit Type and Rh: 600

## 2023-08-14 LAB — MAGNESIUM: Magnesium: 1.6 mg/dL — ABNORMAL LOW (ref 1.7–2.4)

## 2023-08-14 MED ORDER — SODIUM CHLORIDE 0.9% FLUSH
10.0000 mL | Freq: Once | INTRAVENOUS | Status: AC
  Administered 2023-08-14: 10 mL via INTRAVENOUS

## 2023-08-14 MED ORDER — POTASSIUM CHLORIDE 10 MEQ/100ML IV SOLN
10.0000 meq | INTRAVENOUS | Status: AC
  Administered 2023-08-14 (×3): 10 meq via INTRAVENOUS
  Filled 2023-08-14 (×3): qty 100

## 2023-08-14 MED ORDER — METHYLPREDNISOLONE SODIUM SUCC 40 MG IJ SOLR
40.0000 mg | Freq: Once | INTRAMUSCULAR | Status: DC
  Filled 2023-08-14: qty 1

## 2023-08-14 MED ORDER — METHYLPREDNISOLONE 4 MG PO TBPK: ORAL_TABLET | ORAL | 0 refills | Status: DC

## 2023-08-14 MED ORDER — FAMOTIDINE IN NACL 20-0.9 MG/50ML-% IV SOLN
20.0000 mg | Freq: Once | INTRAVENOUS | Status: AC
  Administered 2023-08-14: 20 mg via INTRAVENOUS

## 2023-08-14 MED ORDER — METHYLPREDNISOLONE SODIUM SUCC 125 MG IJ SOLR
125.0000 mg | Freq: Once | INTRAMUSCULAR | Status: AC
  Administered 2023-08-14: 125 mg via INTRAVENOUS

## 2023-08-14 MED ORDER — POTASSIUM CHLORIDE CRYS ER 20 MEQ PO TBCR
40.0000 meq | EXTENDED_RELEASE_TABLET | Freq: Once | ORAL | Status: AC
  Administered 2023-08-14: 40 meq via ORAL
  Filled 2023-08-14: qty 2

## 2023-08-14 MED ORDER — HEPARIN SOD (PORK) LOCK FLUSH 100 UNIT/ML IV SOLN
500.0000 [IU] | Freq: Once | INTRAVENOUS | Status: AC
  Administered 2023-08-14: 500 [IU] via INTRAVENOUS

## 2023-08-14 MED ORDER — FAMOTIDINE 20 MG PO TABS: 20.0000 mg | ORAL_TABLET | Freq: Two times a day (BID) | ORAL | 0 refills | Status: AC

## 2023-08-14 MED ORDER — SODIUM CHLORIDE 0.9 % IV SOLN: Freq: Once | INTRAVENOUS | Status: AC

## 2023-08-14 NOTE — Telephone Encounter (Signed)
 CRITICAL VALUE STICKER  CRITICAL VALUE:  potassium  2.5  RECEIVER (on-site recipient of call):  Arbie Knock, LPN  DATE & TIME NOTIFIED: 07/2123  10:08  MESSENGER (representative from lab): Pattie Borders  MD NOTIFIED: Amparo Balk, MD  TIME OF NOTIFICATION:  10:08  RESPONSE:

## 2023-08-14 NOTE — Progress Notes (Signed)
 This Pt reported to infusion today for first first time Monoferric infusion. Pt had c/o new onset rash/hives all over his body. Upon assessment this RN noted Pt to be covered in a head to toe rash/hives. Pt stated "it is very uncomfortable and itchy, I have not been sleeping because of it". Pt received blood transfusion on Friday at [facility] and reported to this RN that he broke out in hives around his ankles on Friday night around 10 PM. Upon further assessment the RN noted rash to be located on Pt's legs, trunk, neck, arms, with some hives starting to appear on Pt's face. Pt stated "I have been taking benadryl  50 mg around the clock with no relief." Pt denies new/worsened SOB, chest pain, or fever. VSS.  This RN made Dr. Rosaline Coma and Angeline Kemps PA-C aware. This RN reached out to the on-call provider Dr.Mohamed and made him aware. This RN received v/o from Dr. Marguerita Shih for Pepcid  20 mg IVPB and Solu-Medrol  40 mg IV to be given for hives.  Angeline Kemps assessed Pt chairside. Per Angeline Kemps PA-C discontinue the order for Solu-Medrol  40 mg IV. This RN received v/o from Angeline Kemps PA-C for Solu-Medrol  125 mg IV to be administered for hives today.  Per Dr. Arneta Beverage first time Monoferric infusion d/t diffuse hives.  This RN received v/o from Dr. Rosaline Coma to draw CBC and CMP on Pt.

## 2023-08-14 NOTE — Progress Notes (Addendum)
 Symptom Management Consult Note [facility]    Patient Care Team: Donley Furth, MD as PCP - General Jacqueline Matsu, MD as PCP - Sleep Medicine (Cardiology) Darlis Eisenmenger, MD as PCP - Advanced Heart Failure (Cardiology) Jolly Needle, MD (Inactive) as Consulting Physician (Cardiology) Alver Austin, [facility] (Inactive) as Pharmacist (Pharmacist)    Name / MRN / DOB: Luis [name]  [id number]  1956/04/13   Date of visit: 08/14/2023   Chief Complaint/Reason for visit: hives   Current Therapy: planning to start Gemzar  and cisplatin  Last treatment:  Keytruda  Cycle 8 day 1 on 01/13/23    ASSESSMENT AND PLAN Patient is a 68 y.o. 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 Rosaline Coma.  I have viewed most recent oncology note and lab work.  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 No symptoms of anaphylaxis.  - Patient received 20 mg IV Pepcid  and 125 mg solu-medrol  in infusion center. Will reschedule iron infusion to next appointment. - Will prescribe medrol  dosepak and Pepcid  x 6 days. Patient instructed to hold Protonix  while taking Pepcid .    #Hypokalemia - Previously on potassium supplements although they were discontinued with his lasix .  - Will give patient 3 run of IV K and 40 mEq PO. Patient will resume PO potassium at home as well. He already has it and was provided with dosage instructions: 20 mEq BID x 7 days. Labs will be rechecked at next visit.  #Hypocalcemia -Calcium  today is 735. Chart review shows hypocalcemia has been consistent over the last month.  - Encouraged TUMs  Discussed plan with Dr. Rosaline Coma who agrees.  Strict ED precautions discussed should symptoms worsen.   HEME/ONC  HISTORY Oncology History  Bladder cancer (HCC)  07/29/2022 Initial Diagnosis   Bladder cancer   07/29/2022 Cancer Staging   Staging form: Urinary Bladder, AJCC 8th Edition - Clinical stage from 07/29/2022: Stage I (cT1, cN0, cM0) - Signed by Ander Bame, MD on 07/29/2022 Stage prefix: Initial diagnosis   08/12/2022 - 01/13/2023 Chemotherapy   Patient is on Treatment Plan : BLADDER Pembrolizumab  (200) q21d     03/03/2023 Cancer Staging   Staging form: Urinary Bladder, AJCC 8th Edition - Pathologic stage from 03/03/2023: Stage IIIA (pT4a, pN1, cM0) - Signed by Keitha Pata, PA-C on 05/11/2023 Histopathologic type: Transitional cell carcinoma, NOS WHO/ISUP grade (low/high): High Grade Histologic grading system: 2 grade system Residual tumor (R): R1 - Microscopic   06/06/2023 -  Chemotherapy   Patient is on Treatment Plan : BLADDER Cisplatin D1 + Gemcitabine  D1,8 q21d x 4 Cycles         INTERVAL HISTORY  Discussed the use of AI scribe software for clinical note transcription with the patient, who gave verbal consent to proceed.    Luis [name] is a 68 y.o. male with oncologic history as above presenting to [facility] today with chief complaint of hives. Accompanied to clinic today by spouse who provides additional history.  He experienced the onset of hives later in the evening after receiving a blood transfusion. This is not his first blood transfusion, but it is the first time he has experienced this reaction. The hives initially appeared around his cheek and have since spread to his abdomen.  He  has been taking Benadryl  to manage the symptoms, which has provided some relief, but the hives persist. No associated symptoms such as shortness of breath, nausea, vomiting, or throat tightness.  He is not currently on any treatment as he is waiting for his lab results to improve before starting chemotherapy. He has not had any issues with blood transfusions in the past.  He is scheduled to  receive an iron infusion today and has not had iron before. He is also taking Pepcid  as part of his current medication regimen.    ROS  All other systems are reviewed and are negative for acute change except as noted in the HPI.    No Known Allergies   Past Medical History:  Diagnosis Date   Anxiety    takes Valium  as needed   Aortic stenosis, moderate    Arthritis    Ascending aortic aneurysm (HCC)    CAD (coronary artery disease)    a. s/p PCI of the RCA 8/12 with DES by Dr Arlester Ladd, preserved EF. b. LHC/RHC (2/16) with mean RA 12, PA 32/15, mean PCWP 18, CI 3.47; patent mid and distal RCA stents, 50-60% proximal stenosis small PDA.      Cancer [facility])    bladder   Carotid stenosis    a. Carotid US  (05/2013):  Bilateral 1-39% ICA; L thyroid  nodule (prior hx of aspiration).   Chronic diastolic CHF (congestive heart failure) (HCC)    Complication of anesthesia    difficulty waking up after gallbladder surgery   Depression    Dyspnea    Essential hypertension    GERD (gastroesophageal reflux disease)    if needed will take OTC meds    Heart murmur    History of colonic polyps    hyperplastic   Hyperlipidemia    Joint pain    Lesion of bladder    Myocardial infarction (HCC) 2012   Obesity (BMI 30-39.9) 02/29/2016   Pre-diabetes    Restless leg    Sleep apnea    uses cpap   Tubular adenoma of colon    Vertigo    takes Meclizine  as needed     Past Surgical History:  Procedure Laterality Date   AORTIC VALVE REPLACEMENT N/A 09/16/2021   Procedure: AORTIC VALVE REPLACEMENT (AVR);  Surgeon: Bartley Lightning, MD;  Location: [facility] OR;  Service: Open Heart Surgery;  Laterality: N/A;   CATARACT EXTRACTION   4 YRS AGO   BOTH EYES   CHOLECYSTECTOMY  07/21/2011   Procedure: LAPAROSCOPIC CHOLECYSTECTOMY WITH INTRAOPERATIVE CHOLANGIOGRAM;  Surgeon: Thayne Fine, MD;  Location: [facility] ORS;  Service: General;  Laterality: N/A;   CORONARY ANGIOPLASTY  2012   2 stents   coronary stenting      s/p PCI of the RCA by Dr Arlester Ladd 8/12 with 2 promus stents   CYSTOSCOPY W/ RETROGRADES Bilateral 12/13/2019   Procedure: CYSTOSCOPY WITH RETROGRADE PYELOGRAM;  Surgeon: Osborn Blaze, MD;  Location: [facility];  Service: Urology;  Laterality: Bilateral;   CYSTOSCOPY W/ RETROGRADES Bilateral 10/14/2020   Procedure: CYSTOSCOPY WITH RETROGRADE PYELOGRAM;  Surgeon: Osborn Blaze, MD;  Location: [facility];  Service: Urology;  Laterality: Bilateral;   CYSTOSCOPY W/ RETROGRADES Bilateral 12/16/2020   Procedure: CYSTOSCOPY WITH RETROGRADE PYELOGRAM;  Surgeon: Osborn Blaze, MD;  Location: [facility];  Service: Urology;  Laterality: Bilateral;   CYSTOSCOPY W/ RETROGRADES Bilateral 06/03/2022   Procedure: CYSTOSCOPY WITH RETROGRADE PYELOGRAM;  Surgeon: Osborn Blaze, MD;  Location: [facility];  Service: Urology;  Laterality: Bilateral;   CYSTOSCOPY W/ RETROGRADES Bilateral 12/28/2022   Procedure: CYSTOSCOPY WITH RETROGRADE PYELOGRAM, FULGARATION OF BLEEDERS;  Surgeon: Melody Spurling., MD;  Location: [facility];  Service: Urology;  Laterality: Bilateral;   CYSTOSCOPY WITH INJECTION N/A 03/03/2023   Procedure: CYSTOSCOPY WITH INDOCYANINE INJECTION;  Surgeon: Melody Spurling., MD;  Location: [facility];  Service: Urology;  Laterality: N/A;  360 MINUTES   ESOPHAGOGASTRODUODENOSCOPY N/A 06/28/2023   Procedure: EGD (ESOPHAGOGASTRODUODENOSCOPY);  Surgeon: Lajuan Pila, MD;  Location: [facility];  Service: Gastroenterology;  Laterality: N/A;   IR IMAGING GUIDED PORT INSERTION  05/29/2023   IR NEPHROSTOMY EXCHANGE LEFT  08/07/2023   IR NEPHROSTOMY PLACEMENT RIGHT  07/01/2023   IR RADIOLOGY PERIPHERAL GUIDED IV START  07/01/2023   IR US  GUIDE VASC ACCESS LEFT  07/01/2023   LEFT AND RIGHT HEART CATHETERIZATION WITH CORONARY ANGIOGRAM N/A 06/23/2014   Procedure: LEFT AND RIGHT HEART CATHETERIZATION WITH CORONARY ANGIOGRAM;  Surgeon: Darlis Eisenmenger, MD;  Location: [facility] C[facility] LAB;   Service: Cardiovascular;  Laterality: N/A;   NECK SURGERY  03/23/09   per Dr. Murrel Arnt, cervical fusion    PERICARDIOCENTESIS N/A 09/28/2021   Procedure: PERICARDIOCENTESIS;  Surgeon: Lucendia Rusk, MD;  Location: [facility] INVASIVE CV LAB;  Service: Cardiovascular;  Laterality: N/A;   REPLACEMENT ASCENDING AORTA N/A 09/16/2021   Procedure: REPLACEMENT ASCENDING AORTA WITH 30 X HEMASHIELD PLATINUM WOVEN DOUBLE VELOUR VASCULAR GRAFT;  Surgeon: Bartley Lightning, MD;  Location: [facility] OR;  Service: Open Heart Surgery;  Laterality: N/A;  CIRC ARREST   right elbow surgery     RIGHT HEART CATH N/A 06/15/2023   Procedure: RIGHT HEART CATH;  Surgeon: Darlis Eisenmenger, MD;  Location: [facility] INVASIVE CV LAB;  Service: Cardiovascular;  Laterality: N/A;   RIGHT HEART CATH AND CORONARY ANGIOGRAPHY N/A 07/08/2021   Procedure: RIGHT HEART CATH AND CORONARY ANGIOGRAPHY;  Surgeon: Darlis Eisenmenger, MD;  Location: [facility] INVASIVE CV LAB;  Service: Cardiovascular;  Laterality: N/A;   ROBOT ASSISTED LAPAROSCOPIC COMPLETE CYSTECT ILEAL CONDUIT N/A 03/03/2023   Procedure: XI ROBOTIC ASSISTED LAPAROSCOPIC COMPLETE CYSTECTECTOMY WITH  ILEAL CONDUIT DIVERSION;  Surgeon: Melody Spurling., MD;  Location: [facility];  Service: Urology;  Laterality: N/A;   ROBOT ASSISTED LAPAROSCOPIC RADICAL PROSTATECTOMY N/A 03/03/2023   Procedure: XI ROBOTIC ASSISTED LAPAROSCOPIC RADICAL PROSTATECTOMY WITH LYMPH NODE DISSECTION;  Surgeon: Melody Spurling., MD;  Location: [facility];  Service: Urology;  Laterality: N/A;   solonscopy  05/23/08   per Dr. Baruch Bosch hemorrhoids only, repeat in 5 years   SUBXYPHOID PERICARDIAL WINDOW N/A 09/28/2021   Procedure: SUBXYPHOID PERICARDIAL WINDOW;  Surgeon: Bartley Lightning, MD;  Location: [facility] OR;  Service: Thoracic;  Laterality: N/A;   TEE WITHOUT CARDIOVERSION N/A 06/23/2014   Procedure: TRANSESOPHAGEAL ECHOCARDIOGRAM (TEE);  Surgeon: Darlis Eisenmenger, MD;  Location: [facility] ENDOSCOPY;  Service: Cardiovascular;   Laterality: N/A;   TEE WITHOUT CARDIOVERSION N/A 01/21/2016   Procedure: TRANSESOPHAGEAL ECHOCARDIOGRAM (TEE);  Surgeon: Darlis Eisenmenger, MD;  Location: [facility] ENDOSCOPY;  Service: Cardiovascular;  Laterality: N/A;   TEE WITHOUT CARDIOVERSION N/A 09/16/2021   Procedure: TRANSESOPHAGEAL ECHOCARDIOGRAM (TEE);  Surgeon: Bartley Lightning, MD;  Location: [facility] OR;  Service: Open Heart Surgery;  Laterality: N/A;   TEE WITHOUT CARDIOVERSION N/A 09/28/2021   Procedure: TRANSESOPHAGEAL ECHOCARDIOGRAM (TEE);  Surgeon: Bartley Lightning, MD;  [facility] OR;  Service: Thoracic;  Laterality: N/A;   TONSILLECTOMY     TRANSESOPHAGEAL ECHOCARDIOGRAM (CATH  LAB) N/A 04/17/2023   Procedure: TRANSESOPHAGEAL ECHOCARDIOGRAM;  Surgeon: Hugh Madura, MD;  Location: [facility] INVASIVE CV LAB;  Service: Cardiovascular;  Laterality: N/A;   TRANSURETHRAL RESECTION OF BLADDER TUMOR N/A 10/21/2019   Procedure: TRANSURETHRAL RESECTION OF BLADDER TUMOR (TURBT);  Surgeon: Ottelin, Mark, MD;  Location: [facility];  Service: Urology;  Laterality: N/A;   TRANSURETHRAL RESECTION OF BLADDER TUMOR N/A 12/13/2019   Procedure: TRANSURETHRAL RESECTION OF BLADDER TUMOR (TURBT);  Surgeon: Osborn Blaze, MD;  Location: [facility];  Service: Urology;  Laterality: N/A;  1 HR   TRANSURETHRAL RESECTION OF BLADDER TUMOR N/A 10/14/2020   Procedure: TRANSURETHRAL RESECTION OF BLADDER TUMOR (TURBT);  Surgeon: Osborn Blaze, MD;  Location: [facility];  Service: Urology;  Laterality: N/A;   TRANSURETHRAL RESECTION OF BLADDER TUMOR N/A 12/16/2020   Procedure: RESTAGING TRANSURETHRAL RESECTION OF BLADDER TUMOR (TURBT);  Surgeon: Osborn Blaze, MD;  Location: [facility];  Service: Urology;  Laterality: N/A;   TRANSURETHRAL RESECTION OF BLADDER TUMOR N/A 06/03/2022   Procedure: TRANSURETHRAL RESECTION OF BLADDER TUMOR (TURBT);  Surgeon: Osborn Blaze, MD;  Location: [facility];  Service: Urology;  Laterality:  N/A;   UMBILICAL HERNIA REPAIR  03/03/2023   Procedure: HERNIA REPAIR UMBILICAL;  Surgeon: Melody Spurling., MD;  Location: [facility];  Service: Urology;;    Social History   Socioeconomic History   Marital status: Married    Spouse name: Not on file   Number of children: 4   Years of education: Not on file   Highest education level: Not on file  Occupational History   Occupation: Manufacturing engineer    Employer: [employer]  Tobacco Use   Smoking status: Former    Current packs/day: 0.00    Average packs/day: 2.0 packs/day for 40.0 years (80.0 ttl pk-yrs)    Types: Cigarettes    Start date: 38    Quit date: 2012    Years since quitting: 13.3    Passive exposure: Never   Smokeless tobacco: Never  Vaping Use   Vaping status: Never Used  Substance and Sexual Activity   Alcohol use: No    Alcohol/week: 0.0 standard drinks of alcohol   Drug use: No   Sexual activity: Not on file  Other Topics Concern   Not on file  Social History Narrative   Not on file   Social Drivers of Health   Financial Resource Strain: Low Risk  (11/03/2022)   Overall Financial Resource Strain (CARDIA)    Difficulty of Paying Living Expenses: Not hard at all  Food Insecurity: No Food Insecurity (07/04/2023)   Hunger Vital Sign    Worried About Running Out of Food in the Last Year: Never true    Ran Out of Food in the Last Year: Never true  Transportation Needs: No Transportation Needs (07/04/2023)   PRAPARE - Administrator, Civil Service (Medical): No    Lack of Transportation (Non-Medical): No  Physical Activity: Unknown (11/03/2022)   Exercise Vital Sign    Days of Exercise per Week: Patient unable to answer    Minutes of Exercise per Session: 30 min  Stress: No Stress Concern Present (11/03/2022)   [employer] of Occupational Health - Occupational Stress Questionnaire    Feeling of Stress : Not at all  Social Connections: Moderately Isolated (06/27/2023)    Social Connection and Isolation Panel [NHANES]    Frequency of Communication with Friends and Family: More than  three times a week    Frequency of Social Gatherings with Friends and Family: More than three times a week    Attends Religious Services: Never    Database administrator or Organizations: No    Attends Banker Meetings: Never    Marital Status: Married  Catering manager Violence: Not At Risk (07/04/2023)   Humiliation, Afraid, Rape, and Kick questionnaire    Fear of Current or Ex-Partner: No    Emotionally Abused: No    Physically Abused: No    Sexually Abused: No    Family History  Problem Relation Age of Onset   Lung cancer Mother        lung   Esophageal cancer Cousin    Colon cancer Neg Hx    Rectal cancer Neg Hx    Stomach cancer Neg Hx      Current Outpatient Medications:    [START ON 08/15/2023] famotidine  (PEPCID ) 20 MG tablet, Take 1 tablet (20 mg total) by mouth 2 (two) times daily for 6 days., Disp: 12 tablet, Rfl: 0   [START ON 08/15/2023] methylPREDNISolone  (MEDROL  DOSEPAK) 4 MG TBPK tablet, Take 6 pills by mouth day 1, 5 on day 2, 4 on day 3, 3 on day 4, 2 on day 5, 1 on day 6, Disp: 21 tablet, Rfl: 0   acetaminophen  (TYLENOL ) 325 MG tablet, Take 650 mg by mouth every 6 (six) hours as needed (for pain)., Disp: , Rfl:    amLODipine  (NORVASC ) 5 MG tablet, Take 1 tablet (5 mg total) by mouth daily., Disp: 90 tablet, Rfl: 1   aspirin  EC 81 MG tablet, Take 81 mg by mouth in the morning., Disp: , Rfl:    carvedilol  (COREG ) 3.125 MG tablet, TAKE 1 TABLET BY MOUTH TWICE A DAY WITH FOOD, Disp: 180 tablet, Rfl: 1   cyanocobalamin  (VITAMIN B12) 1000 MCG tablet, Take 1 tablet (1,000 mcg total) by mouth daily., Disp: 30 tablet, Rfl: 6   diazepam  (VALIUM ) 5 MG tablet, TAKE 1 TABLET BY MOUTH EVERY 12 HOURS AS NEEDED FOR ANXIETY., Disp: 60 tablet, Rfl: 5   ferrous sulfate  325 (65 FE) MG EC tablet, Take 1 tablet (325 mg total) by mouth every other day., Disp: 15  tablet, Rfl: 1   [Paused] furosemide  (LASIX ) 20 MG tablet, Take 1 tablet (20 mg total) by mouth daily. (Patient not taking: Reported on 08/01/2023), Disp: 45 tablet, Rfl: 3   ipratropium (ATROVENT ) 0.03 % nasal spray, Place 2 sprays into both nostrils every 12 (twelve) hours as needed for rhinitis., Disp: , Rfl:    lidocaine -prilocaine  (EMLA ) cream, Apply 1 Application topically as needed., Disp: 30 g, Rfl: 0   Magnesium  500 MG TABS, Take 500 mg by mouth in the morning., Disp: , Rfl:    meclizine  (ANTIVERT ) 25 MG tablet, Take 1 tablet (25 mg total) by mouth 3 (three) times daily as needed for dizziness., Disp: 30 tablet, Rfl: 0   multivitamin-iron-minerals-folic acid  (CENTRUM) chewable tablet, Chew 1 tablet by mouth daily., Disp: , Rfl:    nitroGLYCERIN  (NITROSTAT ) 0.4 MG SL tablet, Place 0.4 mg under the tongue every 5 (five) minutes as needed for chest pain., Disp: , Rfl:    ondansetron  (ZOFRAN ) 8 MG tablet, Take 1 tablet (8 mg total) by mouth every 8 (eight) hours as needed., Disp: 30 tablet, Rfl: 0   pantoprazole  (PROTONIX ) 40 MG tablet, Take 1 tablet (40 mg total) by mouth 2 (two) times daily before a meal., Disp: 60 tablet, Rfl:  5   [Paused] potassium chloride  SA (KLOR-CON  M20) 20 MEQ tablet, Take 1 tablet (20 mEq total) by mouth daily. (Patient not taking: Reported on 08/01/2023), Disp: , Rfl:    prochlorperazine  (COMPAZINE ) 10 MG tablet, Take 1 tablet (10 mg total) by mouth every 6 (six) hours as needed for nausea or vomiting., Disp: 30 tablet, Rfl: 0   rosuvastatin  (CRESTOR ) 10 MG tablet, Take 1 tablet (10 mg total) by mouth at bedtime. Reduce your dose of crestor  to 10 mg daily.  Follow with your PCP outpatient to follow your kidney function to determine if this dose reduction should be long term., Disp: 30 tablet, Rfl: 0   sodium chloride  flush (NS) 0.9 % SOLN, Flush catheter slowly with 5ml as needed if output decreases., Disp: 300 mL, Rfl: 2   traMADol  (ULTRAM ) 50 MG tablet, Take 1 tablet (50  mg total) by mouth every 6 (six) hours as needed., Disp: 30 tablet, Rfl: 0   venlafaxine  XR (EFFEXOR -XR) 150 MG 24 hr capsule, TAKE 1 CAPSULE BY MOUTH DAILY WITH BREAKFAST., Disp: 90 capsule, Rfl: 0 No current facility-administered medications for this visit.  Facility-Administered Medications Ordered in Other Visits:    potassium chloride  10 mEq in 100 mL IVPB, 10 mEq, Intravenous, Q1 Hr x 3, Walisiewicz, Merrick Maggio E, PA-C, Last Rate: 100 mL/hr at 08/14/23 1039, 10 mEq at 08/14/23 1039  PHYSICAL EXAM ECOG FS:1 - Symptomatic but completely ambulatory    Vitals:   08/14/23 0738  BP: 125/67  Pulse: 67  Resp: 18  Temp: 98.7 F (37.1 C)  TempSrc: Oral  SpO2: 100%   Physical Exam Vitals and nursing note reviewed.  Constitutional:      Appearance: He is not ill-appearing or toxic-appearing.     Comments: Nephrostomy tubes in place. Dressings C/D/I  HENT:     Head: Normocephalic.     Mouth/Throat:     Comments: No oral lesions Eyes:     Conjunctiva/sclera: Conjunctivae normal.  Cardiovascular:     Rate and Rhythm: Normal rate and regular rhythm.     Pulses: Normal pulses.     Heart sounds: Normal heart sounds.  Pulmonary:     Effort: Pulmonary effort is normal.     Breath sounds: Normal breath sounds.  Abdominal:     General: There is no distension.  Musculoskeletal:     Cervical back: Normal range of motion.  Skin:    General: Skin is warm and dry.     Findings: Rash present. Rash is urticarial (diffuse).  Neurological:     Mental Status: He is alert.        LABORATORY DATA I have reviewed the data as listed    Latest Ref Rng & Units 08/14/2023    9:15 AM 08/09/2023   12:22 PM 07/11/2023    9:31 AM  CBC  WBC 4.0 - 10.5 K/uL 6.7  6.1  6.4   Hemoglobin 13.0 - 17.0 g/dL 8.0  7.3  8.6   Hematocrit 39.0 - 52.0 % 24.6  23.0  26.4   Platelets 150 - 400 K/uL 148  142  140         Latest Ref Rng & Units 08/14/2023    9:15 AM 08/09/2023   12:22 PM 07/11/2023    9:31 AM   CMP  Glucose 70 - 99 mg/dL 161  92  096   BUN 8 - 23 mg/dL 15  15  19    Creatinine 0.61 - 1.24 mg/dL 0.45  4.09  1.74   Sodium 135 - 145 mmol/L 140  139  138   Potassium 3.5 - 5.1 mmol/L 2.5  3.2  3.7   Chloride 98 - 111 mmol/L 107  106  108   CO2 22 - 32 mmol/L 25  25  24    Calcium  8.9 - 10.3 mg/dL 7.5  8.4  8.5   Total Protein 6.5 - 8.1 g/dL 6.3  8.0  8.0   Total Bilirubin 0.0 - 1.2 mg/dL 0.5  0.7  0.5   Alkaline Phos 38 - 126 U/L 88  88  119   AST 15 - 41 U/L 12  23  18    ALT 0 - 44 U/L 13  21  23         RADIOGRAPHIC STUDIES (from last 24 hours if applicable) I have personally reviewed the radiological images as listed and agreed with the findings in the report. No results found.      Visit Diagnosis: 1. Malignant neoplasm of urinary bladder, unspecified site (HCC)   2. Hives   3. Hypokalemia   4. Hypocalcemia      Orders Placed This Encounter  Procedures   Magnesium     Standing Status:   Future    Number of Occurrences:   1    Expected Date:   08/14/2023    Expiration Date:   08/13/2024    All questions were answered. The patient knows to call the clinic with any problems, questions or concerns. No barriers to learning was detected.  A total of more than 30 minutes were spent on this encounter with face-to-face time and non-face-to-face time, including preparing to see the patient, ordering tests and/or medications, counseling the patient and coordination of care as outlined above.    Thank you for allowing me to participate in the care of this patient.    Ryn Peine E  Walisiewicz, PA-C Department of Hematology/Oncology [facility] Phone: [phone]  [phone]    08/14/2023 11:26 AM

## 2023-08-14 NOTE — Patient Instructions (Addendum)
 Starting tomorrow please take the medrol  dosepak (steroids) and Pepcid  for 6 days.  While take the Pepcid  you should NOT take your Protonix . Once you finish the Pepcid  you can resume your Protonix .  Please start taking your potassium again. You should take 20mEq twice a day until your next appointment on 08/25/23. It will be rechecked that day

## 2023-08-14 NOTE — Patient Instructions (Signed)
 Hives Hives are itchy, red, swollen areas on your skin. They can show up on any part of your body. They often go away within 24 hours (acute hives). If you get new hives after the old ones fade and this goes on for many days or weeks, it is called chronic hives. Hives do not spread from person to person (are not contagious). Hives can happen when your body reacts to something that you are allergic to (allergen). These are sometimes called triggers. You can get hives right after being around a trigger, or hours later. What are the causes? Food allergies. Insect bites or stings. Allergies to pollen or pets. Spending time in sunlight, heat, or cold. Exercise. Stress. Other causes, such as: Viruses. This includes the common cold. Infections caused by germs (bacteria). Some medicines. Chemicals or latex. Allergy shots. Blood transfusions. In some cases, the cause is not known. What increases the risk? Being male. Being allergic to foods, such as: Citrus fruits. Milk. Eggs. Peanuts. Tree nuts. Shellfish. Being allergic to: Medicines. Latex. Insects. Animals. Pollen. What are the signs or symptoms?  Itchy, red or white bumps or spots on your skin. These areas may: Swell and get bigger. Change in shape and location. Stand alone or connect to each other over a large area of skin. Sting or hurt. Turn white when pressed in the center (blanch). In very bad cases, your hands, feet, and face may also swell. This may happen if hives start deeper in your skin. How is this treated? Treatment for hives depends on your symptoms. You may need to: Use cool, wet cloths (cool compresses) or take cool showers to stop the itching. Take or apply medicines to: Help with itching (antihistamines). Lessen swelling (corticosteroids). Treat infection (antibiotics). Have a medicine called omalizumab given to you as a shot. You may need this if your hives do not get better with other treatments. In  very bad cases, you may need to use a device filled with medicine that gives an emergency shot of epinephrine (auto-injector pen) to stop a very bad allergic reaction (anaphylactic reaction). Follow these instructions at home: Medicines Take or apply over-the-counter and prescription medicines only as told by your doctor. If you were prescribed antibiotics, use them as told by your doctor. Do not stop using them even if you start to feel better. Skin care Put cool, wet cloths on the hives. Do not scratch your skin. Do not rub your skin. General instructions Do not take hot showers or baths. This can make itching worse. Do not wear tight clothes. Use sunscreen. Wear clothes that cover your skin when you are outside. Avoid triggers that cause your hives. Keep a journal to help track what causes your hives. Write down: What medicines you take. What you eat and drink. What you put on your skin. Keep all follow-up visits. Your doctor will need to make sure treatment is working. Contact a doctor if: Your symptoms do not get better with medicine. Your joints hurt or swell. You have a fever. You have pain in your belly (abdomen). Get help right away if: Your tongue or lips swell. Your eyelids are swollen. Your chest or throat feels tight. You have trouble breathing or swallowing. These symptoms may be an emergency. Get help right away. Call 911. Do not wait to see if the symptoms will go away. Do not drive yourself to the hospital. This information is not intended to replace advice given to you by your health care provider. Make sure  you discuss any questions you have with your health care provider. Document Revised: 12/28/2021 Document Reviewed: 12/28/2021 [employer]vier Patient Education  2024 [employer].

## 2023-08-15 MED ORDER — ROSUVASTATIN CALCIUM 20 MG PO TABS: 20.0000 mg | ORAL_TABLET | Freq: Every day | ORAL | 3 refills | Status: AC

## 2023-08-15 NOTE — Telephone Encounter (Signed)
 I sent these in this morning

## 2023-08-15 NOTE — Telephone Encounter (Signed)
 Please advise if ok to send refill for this Rx, this particular one is not on pt chart.

## 2023-08-15 NOTE — Telephone Encounter (Signed)
 Done

## 2023-08-17 ENCOUNTER — Other Ambulatory Visit: Payer: Self-pay

## 2023-08-22 DIAGNOSIS — Z936 Other artificial openings of urinary tract status: Secondary | ICD-10-CM | POA: Diagnosis not present

## 2023-08-22 DIAGNOSIS — Z8551 Personal history of malignant neoplasm of bladder: Secondary | ICD-10-CM | POA: Diagnosis not present

## 2023-08-23 ENCOUNTER — Telehealth: Payer: Self-pay | Admitting: *Deleted

## 2023-08-23 NOTE — Telephone Encounter (Signed)
 TCT patient after receiving vm message.  Spoke with both pt and his wife.  Francoise Ishihara states Luis [name] finished the steroids for the hives and itching he developed after his blood transfusion last week. The steroids were completed on Sunday. He is still c/o itching. He states the hives are gone, there is just some residual redness of his skin. He states the itching is really still quite bad. Advised that I would discuss with Dr. Rosaline Coma and then call them back.

## 2023-08-24 ENCOUNTER — Other Ambulatory Visit: Payer: Self-pay | Admitting: Hematology and Oncology

## 2023-08-24 DIAGNOSIS — C679 Malignant neoplasm of bladder, unspecified: Secondary | ICD-10-CM

## 2023-08-24 NOTE — Telephone Encounter (Signed)
 TCT pt's spouse, Francoise Ishihara. Spoke to her. Advised that Dr. Rosaline Coma recommends Bendaryl 25 mg at night, claratin or zyrtec during the day. She voiced understanding and her husband will try this tooday/tonight. He does have an appt with Dr. Rosaline Coma tomorrow so his current situation can be reviewed at that appt.

## 2023-08-24 NOTE — Progress Notes (Signed)
 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 Rosaline Coma  08/12/2022: Cycle 1 of Pembrolizumab  09/02/2022: Cycle 2 of Pembrolizumab   09/23/2022: Cycle 3 of Pembrolizumab  10/14/2022: Cycle 4 of Pembrolizumab  11/04/2022: Cycle 5 of Pembrolizumab  11/25/2022: Cycle 6 of Pembrolizumab  12/20/2022: Cycle 7 of Pembrolizumab  01/13/2023: Cycle 8 of Pembrolizumab   03/03/2023: Cystectomy/Prostatectomy showed infiltrative high-grade urothelial carcinoma, size 5.8 cm involving bladder dome, anterior wall and prostatic stroma. Tumor invades directly into prostatic stroma at apex and mid portion of the gland (pT4a). Metastatic urothelial cancer in one of two left common iliac lymph nodes.  06/19/2023-07/27/2023: Received adjuvant radiation to surgical bed.  He received 50.4 Gray in 28 fractions.  CHIEF COMPLAINTS/PURPOSE OF CONSULTATION:  "High Risk Non-Muscle Invasive Bladder Cancer "  HISTORY OF PRESENTING ILLNESS:  Luis [name] 68 y.o. 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  He was  last seen on 08/09/2023.   He underwent bilateral nephrostomy placement as well and recently had an exchange on 08/07/2023.  Additionally, he completed adjuvant radiation on 07/27/2023.  On exam today Luis [name] reports since his blood transfusion he broke out with an itchy rash all over his body.  He reports it has improved since he was last seen.  He notes that he has been using over-the-counter lotion and moisturizer to help with this.  He is also been taking Benadryl  to little effect, just recently started Zyrtec.  He reports that he has had no problem with the urostomy tubes did have some blood in the urine initially which has subtly improved.  He is not having any pain or bleeding at this time.  He reports that he also has bags under his eyes.  He reports that he is doing his best to try to drink water  and stay hydrated.  He has no residual side effects from radiation such as nausea, vomiting, or diarrhea.  Overall he is eager to start chemotherapy, but understands that due to his anemia and kidney function he is not a good candidate at this time.  We will continue to monitor his blood counts and in the event he improves we are happy to see him back.  He voiced understanding of this plan.  A full 10 point ROS is otherwise negative.  MEDICAL HISTORY:  Past Medical History:  Diagnosis Date   Anxiety    takes Valium  as needed   Aortic stenosis, moderate    Arthritis    Ascending aortic aneurysm (HCC)    CAD (coronary artery disease)    a. s/p PCI of the RCA 8/12 with DES by Dr Arlester Ladd, preserved EF. b. LHC/RHC (2/16) with mean RA 12, PA 32/15, mean PCWP 18, CI 3.47; patent mid and distal  RCA stents, 50-60% proximal stenosis small PDA.      Cancer [facility])    bladder   Carotid stenosis    a. Carotid US  (05/2013):  Bilateral 1-39% ICA; L thyroid  nodule (prior hx of aspiration).   Chronic diastolic CHF (congestive heart failure) (HCC)    Complication of anesthesia    difficulty waking up after  gallbladder surgery   Depression    Dyspnea    Essential hypertension    GERD (gastroesophageal reflux disease)    if needed will take OTC meds    Heart murmur    History of colonic polyps    hyperplastic   Hyperlipidemia    Joint pain    Lesion of bladder    Myocardial infarction (HCC) 2012   Obesity (BMI 30-39.9) 02/29/2016   Pre-diabetes    Restless leg    Sleep apnea    uses cpap   Tubular adenoma of colon    Vertigo    takes Meclizine  as needed    SURGICAL HISTORY: Past Surgical History:  Procedure Laterality Date   AORTIC VALVE REPLACEMENT N/A 09/16/2021   Procedure: AORTIC VALVE REPLACEMENT (AVR);  Surgeon: Bartley Lightning, MD;  Location: [facility] OR;  Service: Open Heart Surgery;  Laterality: N/A;   CATARACT EXTRACTION   4 YRS AGO   BOTH EYES   CHOLECYSTECTOMY  07/21/2011   Procedure: LAPAROSCOPIC CHOLECYSTECTOMY WITH INTRAOPERATIVE CHOLANGIOGRAM;  Surgeon: Thayne Fine, MD;  Location: [facility]S;  Service: General;  Laterality: N/A;   CORONARY ANGIOPLASTY  2012   2 stents   coronary stenting     s/p PCI of the RCA by Dr Arlester Ladd 8/12 with 2 promus stents   CYSTOSCOPY W/ RETROGRADES Bilateral 12/13/2019   Procedure: CYSTOSCOPY WITH RETROGRADE PYELOGRAM;  Surgeon: Osborn Blaze, MD;  Location: [facility];  Service: Urology;  Laterality: Bilateral;   CYSTOSCOPY W/ RETROGRADES Bilateral 10/14/2020   Procedure: CYSTOSCOPY WITH RETROGRADE PYELOGRAM;  Surgeon: Osborn Blaze, MD;  Location: [facility];  Service: Urology;  Laterality: Bilateral;   CYSTOSCOPY W/ RETROGRADES Bilateral 12/16/2020   Procedure: CYSTOSCOPY WITH RETROGRADE PYELOGRAM;  Surgeon: Osborn Blaze, MD;  Location: [facility];  Service: Urology;  Laterality: Bilateral;   CYSTOSCOPY W/ RETROGRADES Bilateral 06/03/2022   Procedure: CYSTOSCOPY WITH RETROGRADE PYELOGRAM;  Surgeon: Osborn Blaze, MD;  Location: [facility];  Service: Urology;  Laterality: Bilateral;    CYSTOSCOPY W/ RETROGRADES Bilateral 12/28/2022   Procedure: CYSTOSCOPY WITH RETROGRADE PYELOGRAM, FULGARATION OF BLEEDERS;  Surgeon: Melody Spurling., MD;  Location: [facility];  Service: Urology;  Laterality: Bilateral;   CYSTOSCOPY WITH INJECTION N/A 03/03/2023   Procedure: CYSTOSCOPY WITH INDOCYANINE INJECTION;  Surgeon: Melody Spurling., MD;  Location: [facility];  Service: Urology;  Laterality: N/A;  360 MINUTES   ESOPHAGOGASTRODUODENOSCOPY N/A 06/28/2023   Procedure: EGD (ESOPHAGOGASTRODUODENOSCOPY);  Surgeon: Lajuan Pila, MD;  Location: [facility];  Service: Gastroenterology;  Laterality: N/A;   IR IMAGING GUIDED PORT INSERTION  05/29/2023   IR NEPHROSTOMY EXCHANGE LEFT  08/07/2023   IR NEPHROSTOMY PLACEMENT RIGHT  07/01/2023   IR RADIOLOGY PERIPHERAL GUIDED IV START  07/01/2023   IR US  GUIDE VASC ACCESS LEFT  07/01/2023   LEFT AND RIGHT HEART CATHETERIZATION WITH CORONARY ANGIOGRAM N/A 06/23/2014   Procedure: LEFT AND RIGHT HEART CATHETERIZATION WITH CORONARY ANGIOGRAM;  Surgeon: Darlis Eisenmenger, MD;  Location: [facility] CATH LAB;  Service: Cardiovascular;  Laterality: N/A;   NECK SURGERY  03/23/09   per Dr. Murrel Arnt,  cervical fusion    PERICARDIOCENTESIS N/A 09/28/2021   Procedure: PERICARDIOCENTESIS;  Surgeon: Lucendia Rusk, MD;  Location: [facility] INVASIVE CV LAB;  Service: Cardiovascular;  Laterality: N/A;   REPLACEMENT ASCENDING AORTA N/A 09/16/2021   Procedure: REPLACEMENT ASCENDING AORTA WITH 30 X HEMASHIELD PLATINUM WOVEN DOUBLE VELOUR VASCULAR GRAFT;  Surgeon: Bartley Lightning, MD;  Location: [facility] OR;  Service: Open Heart Surgery;  Laterality: N/A;  CIRC ARREST   right elbow surgery     RIGHT HEART CATH N/A 06/15/2023   Procedure: RIGHT HEART CATH;  Surgeon: Darlis Eisenmenger, MD;  Location: [facility] INVASIVE CV LAB;  Service: Cardiovascular;  Laterality: N/A;   RIGHT HEART CATH AND CORONARY ANGIOGRAPHY N/A 07/08/2021   Procedure: RIGHT HEART CATH AND CORONARY ANGIOGRAPHY;  Surgeon: Darlis Eisenmenger, MD;   Location: [facility] INVASIVE CV LAB;  Service: Cardiovascular;  Laterality: N/A;   ROBOT ASSISTED LAPAROSCOPIC COMPLETE CYSTECT ILEAL CONDUIT N/A 03/03/2023   Procedure: XI ROBOTIC ASSISTED LAPAROSCOPIC COMPLETE CYSTECTECTOMY WITH  ILEAL CONDUIT DIVERSION;  Surgeon: Melody Spurling., MD;  Location: [facility];  Service: Urology;  Laterality: N/A;   ROBOT ASSISTED LAPAROSCOPIC RADICAL PROSTATECTOMY N/A 03/03/2023   Procedure: XI ROBOTIC ASSISTED LAPAROSCOPIC RADICAL PROSTATECTOMY WITH LYMPH NODE DISSECTION;  Surgeon: Melody Spurling., MD;  Location: [facility];  Service: Urology;  Laterality: N/A;   solonscopy  05/23/08   per Dr. Baruch Bosch hemorrhoids only, repeat in 5 years   SUBXYPHOID PERICARDIAL WINDOW N/A 09/28/2021   Procedure: SUBXYPHOID PERICARDIAL WINDOW;  Surgeon: Bartley Lightning, MD;  Location: [facility] OR;  Service: Thoracic;  Laterality: N/A;   TEE WITHOUT CARDIOVERSION N/A 06/23/2014   Procedure: TRANSESOPHAGEAL ECHOCARDIOGRAM (TEE);  Surgeon: Darlis Eisenmenger, MD;  Location: [facility] ENDOSCOPY;  Service: Cardiovascular;  Laterality: N/A;   TEE WITHOUT CARDIOVERSION N/A 01/21/2016   Procedure: TRANSESOPHAGEAL ECHOCARDIOGRAM (TEE);  Surgeon: Darlis Eisenmenger, MD;  Location: [facility] ENDOSCOPY;  Service: Cardiovascular;  Laterality: N/A;   TEE WITHOUT CARDIOVERSION N/A 09/16/2021   Procedure: TRANSESOPHAGEAL ECHOCARDIOGRAM (TEE);  Surgeon: Bartley Lightning, MD;  Location: [facility] OR;  Service: Open Heart Surgery;  Laterality: N/A;   TEE WITHOUT CARDIOVERSION N/A 09/28/2021   Procedure: TRANSESOPHAGEAL ECHOCARDIOGRAM (TEE);  Surgeon: Bartley Lightning, MD;  Location: [facility] OR;  Service: Thoracic;  Laterality: N/A;   TONSILLECTOMY     TRANSESOPHAGEAL ECHOCARDIOGRAM (CATH LAB) N/A 04/17/2023   Procedure: TRANSESOPHAGEAL ECHOCARDIOGRAM;  Surgeon: Hugh Madura, MD;  Location: [facility] INVASIVE CV LAB;  Service: Cardiovascular;  Laterality: N/A;   TRANSURETHRAL RESECTION OF BLADDER TUMOR N/A 10/21/2019   Procedure: TRANSURETHRAL  RESECTION OF BLADDER TUMOR (TURBT);  Surgeon: Ottelin, Mark, MD;  Location: [facility];  Service: Urology;  Laterality: N/A;   TRANSURETHRAL RESECTION OF BLADDER TUMOR N/A 12/13/2019   Procedure: TRANSURETHRAL RESECTION OF BLADDER TUMOR (TURBT);  Surgeon: Osborn Blaze, MD;  Location: [facility];  Service: Urology;  Laterality: N/A;  1 HR   TRANSURETHRAL RESECTION OF BLADDER TUMOR N/A 10/14/2020   Procedure: TRANSURETHRAL RESECTION OF BLADDER TUMOR (TURBT);  Surgeon: Osborn Blaze, MD;  Location: [facility];  Service: Urology;  Laterality: N/A;   TRANSURETHRAL RESECTION OF BLADDER TUMOR N/A 12/16/2020   Procedure: RESTAGING TRANSURETHRAL RESECTION OF BLADDER TUMOR (TURBT);  Surgeon: Osborn Blaze, MD;  Location: [facility];  Service: Urology;  Laterality: N/A;   TRANSURETHRAL RESECTION OF BLADDER TUMOR N/A 06/03/2022   Procedure: TRANSURETHRAL RESECTION OF BLADDER TUMOR (TURBT);  Surgeon: Osborn Blaze, MD;  Location: [facility];  Service: Urology;  Laterality: N/A;   UMBILICAL HERNIA REPAIR  03/03/2023   Procedure: HERNIA REPAIR UMBILICAL;  Surgeon: Melody Spurling., MD;  Location: [facility];  Service: Urology;;    SOCIAL HISTORY: Social History   Socioeconomic History   Marital status: Married    Spouse name: Not on file   Number of children: 4   Years of education: Not on file   Highest education level: Not on file  Occupational History   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  Tobacco Use   Smoking status: Former    Current packs/day: 0.00    Average packs/day: 2.0 packs/day for 40.0 years (80.0 ttl pk-yrs)    Types: Cigarettes    Start date: 74    Quit date: 2012    Years since quitting: 13.3    Passive exposure: Never   Smokeless tobacco: Never  Vaping Use   Vaping status: Never Used  Substance and Sexual Activity   Alcohol use: No    Alcohol/week: 0.0 standard drinks of alcohol    Drug use: No   Sexual activity: Not on file  Other Topics Concern   Not on file  Social History Narrative   Not on file   Social Drivers of Health   Financial Resource Strain: Low Risk  (11/03/2022)   Overall Financial Resource Strain (CARDIA)    Difficulty of Paying Living Expenses: Not hard at all  Food Insecurity: No Food Insecurity (07/04/2023)   Hunger Vital Sign    Worried About Running Out of Food in the Last Year: Never true    Ran Out of Food in the Last Year: Never true  Transportation Needs: No Transportation Needs (07/04/2023)   PRAPARE - Administrator, Civil Service (Medical): No    Lack of Transportation (Non-Medical): No  Physical Activity: Unknown (11/03/2022)   Exercise Vital Sign    Days of Exercise per Week: Patient unable to answer    Minutes of Exercise per Session: 30 min  Stress: No Stress Concern Present (11/03/2022)   [employer] of Occupational Health - Occupational Stress Questionnaire    Feeling of Stress : Not at all  Social Connections: Moderately Isolated (06/27/2023)   Social Connection and Isolation Panel [NHANES]    Frequency of Communication with Friends and Family: More than three times a week    Frequency of Social Gatherings with Friends and Family: More than three times a week    Attends Religious Services: Never    Database administrator or Organizations: No    Attends Banker Meetings: Never    Marital Status: Married  Catering manager Violence: Not At Risk (07/04/2023)   Humiliation, Afraid, Rape, and Kick questionnaire    Fear of Current or Ex-Partner: No    Emotionally Abused: No    Physically Abused: No    Sexually Abused: No    FAMILY HISTORY: Family History  Problem Relation Age of Onset   Lung cancer Mother        lung   Esophageal cancer Cousin    Colon cancer Neg Hx    Rectal cancer Neg Hx    Stomach cancer Neg Hx     ALLERGIES:  has no known allergies.  MEDICATIONS:  Current  Outpatient Medications  Medication Sig Dispense Refill   predniSONE (DELTASONE) 20 MG tablet Take 3 tablets (60 mg total) by mouth daily with breakfast for 5 days, THEN 2  tablets (40 mg total) daily with breakfast for 5 days, THEN 1 tablet (20 mg total) daily with breakfast for 5 days. 30 tablet 0   acetaminophen  (TYLENOL ) 325 MG tablet Take 650 mg by mouth every 6 (six) hours as needed (for pain).     amLODipine  (NORVASC ) 5 MG tablet Take 1 tablet (5 mg total) by mouth daily. 90 tablet 1   aspirin  EC 81 MG tablet Take 81 mg by mouth in the morning.     carvedilol  (COREG ) 3.125 MG tablet TAKE 1 TABLET BY MOUTH TWICE A DAY WITH FOOD 180 tablet 1   cyanocobalamin  (VITAMIN B12) 1000 MCG tablet Take 1 tablet (1,000 mcg total) by mouth daily. 30 tablet 6   diazepam  (VALIUM ) 5 MG tablet TAKE 1 TABLET BY MOUTH EVERY 12 HOURS AS NEEDED FOR ANXIETY. 60 tablet 5   famotidine  (PEPCID ) 20 MG tablet Take 1 tablet (20 mg total) by mouth 2 (two) times daily for 6 days. 12 tablet 0   ferrous sulfate  325 (65 FE) MG EC tablet Take 1 tablet (325 mg total) by mouth every other day. 15 tablet 1   [Paused] furosemide  (LASIX ) 20 MG tablet Take 1 tablet (20 mg total) by mouth daily. (Patient not taking: Reported on 08/01/2023) 45 tablet 3   ipratropium (ATROVENT ) 0.03 % nasal spray Place 2 sprays into both nostrils every 12 (twelve) hours as needed for rhinitis.     lidocaine -prilocaine  (EMLA ) cream Apply 1 Application topically as needed. 30 g 0   Magnesium  500 MG TABS Take 500 mg by mouth in the morning.     meclizine  (ANTIVERT ) 25 MG tablet Take 1 tablet (25 mg total) by mouth 3 (three) times daily as needed for dizziness. 30 tablet 0   methylPREDNISolone  (MEDROL  DOSEPAK) 4 MG TBPK tablet Take 6 pills by mouth day 1, 5 on day 2, 4 on day 3, 3 on day 4, 2 on day 5, 1 on day 6 21 tablet 0   multivitamin-iron-minerals-folic acid  (CENTRUM) chewable tablet Chew 1 tablet by mouth daily.     nitroGLYCERIN  (NITROSTAT ) 0.4 MG SL  tablet Place 0.4 mg under the tongue every 5 (five) minutes as needed for chest pain.     ondansetron  (ZOFRAN ) 8 MG tablet Take 1 tablet (8 mg total) by mouth every 8 (eight) hours as needed. 30 tablet 0   pantoprazole  (PROTONIX ) 40 MG tablet Take 1 tablet (40 mg total) by mouth 2 (two) times daily before a meal. 60 tablet 5   [Paused] potassium chloride  SA (KLOR-CON  M20) 20 MEQ tablet Take 1 tablet (20 mEq total) by mouth daily. (Patient not taking: Reported on 08/01/2023)     prochlorperazine  (COMPAZINE ) 10 MG tablet Take 1 tablet (10 mg total) by mouth every 6 (six) hours as needed for nausea or vomiting. 30 tablet 0   rosuvastatin  (CRESTOR ) 20 MG tablet Take 1 tablet (20 mg total) by mouth daily. 90 tablet 3   sodium chloride  flush (NS) 0.9 % SOLN Flush catheter slowly with 5ml as needed if output decreases. 300 mL 2   traMADol  (ULTRAM ) 50 MG tablet Take 1 tablet (50 mg total) by mouth every 6 (six) hours as needed. 30 tablet 0   venlafaxine  XR (EFFEXOR -XR) 150 MG 24 hr capsule TAKE 1 CAPSULE BY MOUTH DAILY WITH BREAKFAST. 90 capsule 0   No current facility-administered medications for this visit.   Facility-Administered Medications Ordered in Other Visits  Medication Dose Route Frequency Provider Last Rate Last Admin  0.9 %  sodium chloride  infusion   Intravenous Continuous Thayil, Irene T, PA-C 10 mL/hr at 08/25/23 1610 New Bag at 08/25/23 0939   alteplase (CATHFLO ACTIVASE) injection 2 mg  2 mg Intracatheter Once PRN Wyline Hearing T, PA-C       cyanocobalamin  (VITAMIN B12) injection 1,000 mcg  1,000 mcg Intramuscular Once Thayil, Irene T, PA-C       ferric derisomaltose  (MONOFERRIC ) 1,000 mg in sodium chloride  0.9 % 100 mL infusion  1,000 mg Intravenous Once Thayil, Irene T, PA-C       heparin  lock flush 100 unit/mL  500 Units Intracatheter Once PRN Thayil, Irene T, PA-C       heparin  lock flush 100 unit/mL  250 Units Intracatheter Once PRN Thayil, Irene T, PA-C       sodium chloride  flush  (NS) 0.9 % injection 10 mL  10 mL Intracatheter Once PRN Wyline Hearing T, PA-C       sodium chloride  flush (NS) 0.9 % injection 3 mL  3 mL Intracatheter Once PRN Wyline Hearing T, PA-C        REVIEW OF SYSTEMS:   Constitutional: ( - ) fevers, ( - )  chills , ( - ) night sweats Eyes: ( - ) blurriness of vision, ( - ) double vision, ( - ) watery eyes Ears, nose, mouth, throat, and face: ( - ) mucositis, ( - ) sore throat Respiratory: ( - ) cough, ( - ) dyspnea, ( - ) wheezes Cardiovascular: ( - ) palpitation, ( - ) chest discomfort, ( - ) lower extremity swelling Gastrointestinal:  ( - ) nausea, ( - ) heartburn, ( - ) change in bowel habits Skin: ( - ) abnormal skin rashes Lymphatics: ( - ) new lymphadenopathy, ( - ) easy bruising Neurological: ( - ) numbness, ( - ) tingling, ( - ) new weaknesses Behavioral/Psych: ( - ) mood change, ( - ) new changes  All other systems were reviewed with the patient and are negative.  PHYSICAL EXAMINATION: ECOG PERFORMANCE STATUS: 1 - Symptomatic but completely ambulatory  Vitals:   08/25/23 0847  BP: 136/68  Pulse: (!) 56  Resp: 16  Temp: 98.1 F (36.7 C)  SpO2: 98%   Filed Weights   08/25/23 0847  Weight: 248 lb 14.4 oz (112.9 kg)     GENERAL: well appearing elderly Caucasian male in NAD  SKIN: skin color, texture, turgor are normal, no rashes or significant lesions EYES: conjunctiva are pink and non-injected, sclera clear LUNGS: clear to auscultation and percussion with normal breathing effort HEART: regular rate & rhythm and no murmurs and no lower extremity edema Musculoskeletal: no cyanosis of digits and no clubbing.  PSYCH: alert & oriented x 3, fluent speech NEURO: no focal motor/sensory deficits  LABORATORY DATA:  I have reviewed the data as listed    Latest Ref Rng & Units 08/25/2023    7:44 AM 08/14/2023    9:15 AM 08/09/2023   12:22 PM  CBC  WBC 4.0 - 10.5 K/uL 8.3  6.7  6.1   Hemoglobin 13.0 - 17.0 g/dL 8.4  8.0  7.3    Hematocrit 39.0 - 52.0 % 25.6  24.6  23.0   Platelets 150 - 400 K/uL 155  148  142        Latest Ref Rng & Units 08/25/2023    7:44 AM 08/14/2023    9:15 AM 08/09/2023   12:22 PM  CMP  Glucose 70 - 99 mg/dL 960  183  92   BUN 8 - 23 mg/dL 18  15  15    Creatinine 0.61 - 1.24 mg/dL 7.82  9.56  2.13   Sodium 135 - 145 mmol/L 136  140  139   Potassium 3.5 - 5.1 mmol/L 4.1  2.5  3.2   Chloride 98 - 111 mmol/L 105  107  106   CO2 22 - 32 mmol/L 26  25  25    Calcium  8.9 - 10.3 mg/dL 8.1  7.5  8.4   Total Protein 6.5 - 8.1 g/dL 6.9  6.3  8.0   Total Bilirubin 0.0 - 1.2 mg/dL 0.8  0.5  0.7   Alkaline Phos 38 - 126 U/L 96  88  88   AST 15 - 41 U/L 10  12  23    ALT 0 - 44 U/L 14  13  21      RADIOGRAPHIC STUDIES: IR NEPHROSTOMY EXCHANGE BILATERAL Result Date: 08/07/2023 INDICATION: History of bladder cancer and cystectomy. Bilateral nephrostomy tubes placed on 07/01/2023 due to acute renal failure and bilateral hydronephrosis. Patient presents for routine nephrostomy tube exchange. EXAM: EXCHANGE OF BILATERAL NEPHROSTOMY TUBES WITH FLUOROSCOPY Physician: Olive Better. Julietta Ogren, MD COMPARISON:  None Available. MEDICATIONS: 1% lidocaine  for local anesthetic ANESTHESIA/SEDATION: None CONTRAST:  10 mL OMNIPAQUE  IOHEXOL  300 MG/ML SOLN - administered into the collecting system(s) FLUOROSCOPY: Radiation Exposure Index (as provided by the fluoroscopic device): 19 mGy Kerma COMPLICATIONS: None immediate. PROCEDURE: The procedure was explained to the patient. The risks and benefits of the procedure were discussed and the patient's questions were addressed. Informed consent was obtained from the patient. Patient was placed prone. Both flanks were prepped and draped in sterile fashion. Maximal barrier sterile technique was utilized including caps, mask, sterile gowns, sterile gloves, sterile drape, hand hygiene and skin antiseptic. Contrast was injected through the left nephrostomy tube. Nephrostomy tube was cut and removed  over a wire. New 10 French multipurpose drain was advanced over the wire and reconstituted in the renal pelvis. Contrast injection confirmed placement in the renal pelvis. Skin was anesthetized with 1% lidocaine . Left nephrostomy tube was sutured to skin and attached to a gravity bag. Contrast was injected through the right nephrostomy tube. Nephrostomy tube was cut and removed over a wire. New 10 French multipurpose drain was advanced over the wire and reconstituted in the renal pelvis. Contrast injection confirmed placement in the renal pelvis. Skin was anesthetized with 1% lidocaine . Right nephrostomy tube was sutured to skin and attached to a gravity bag. Fluoroscopic images were taken and saved for this procedure. FINDINGS: Left nephrostomy tube is reconstituted in left renal pelvis. Right nephrostomy tube is reconstituted in the right renal pelvis. IMPRESSION: Successful exchange of bilateral nephrostomy tubes with fluoroscopy. Electronically Signed   By: Elene Griffes M.D.   On: 08/07/2023 18:24    ASSESSMENT & PLAN Luis [name] 68 y.o. 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  After review of the labs, review of the records, and discussion with the patient the patients findings are most consistent with BCG unresponsive high risk non-muscle invasive bladder cancer, not a candidate for cystectomy.  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 21 days until progression or intolerance up to 24 months. --Received 8 cycles of Pembrolizumab  on 08/12/2022-01/13/2023: Cycle 8 of Pembrolizumab   --On 03/03/2023, underwent Cystectomy/Prostatectomy showed  infiltrative high-grade urothelial carcinoma, size 5.8 cm involving bladder dome, anterior wall and prostatic stroma. Tumor invades directly into prostatic  stroma at apex and mid portion of the gland (pT4a). Metastatic urothelial cancer in one of two left common iliac lymph nodes.  -- Given the results of the pathology showing positive margins and spread to the lymph node we are considering radiation therapy versus chemotherapy.  For chemotherapy, recommend gemcitabine  and cisplatin x 4 cycles.  This would consist of gemcitabine  1000 mg/m on days 1 and 8 and cisplatin 70 mg/m on day 1 of 21-day cycle for 4 cycles. -- Due to kidney dysfunction and anemia, chemotherapy was deferred and patient underwent adjuvant radiation to the surgical bed from 06/19/2023 until 07/27/2023.  He received 50.4 Gray in 28 fractions. PLAN:  --Labs from today show white blood cell 8.3, hemoglobin 8.4, MCV 89.5, platelets 155. Cr 1.96, LFTs normal. -- Due to persistent anemia and elevated Cr will need to continue to hold chemotherapy  --Plan is to proceed with cisplatin and gemcitabine  q. 21 days x 4 cycles once patient's blood counts and kidney function have improved enough.  Plan is currently on hold. --RTC in 2 weeks with labs and follow up in 4 weeks for clinic visit   # Skin Rash -- Occurred after receiving blood transfusion, etiology not entirely clear. -- Patient has been applying moisturizer to the skin and has been taking Benadryl . -- Skin is persistently dry and itchy and erythematous. -- Prescribe another steroid taper with 60 mg for 5 days, 40 for 5 days, and 20 for 5 days.  #Anemia --Hemoglobin is 8.4 today. --May be multifactorial due to nutritional deficiency as well as kidney dysfunction. -- Nutritional labs checked today show borderline vitamin B12 deficiency and iron deficiency. -- Patient is currently taking vitamin B12 p.o. daily and ferrous sulfate  every other day. -- today will receive IV monoferric  and B12 IM injections to bolster levels.  #Hypokalemia: --Potassium level is 4.1 today --Patient is currently taking potassium supplement. Advised to  continue  #Flank pain: --Secondary to bilateral nephrostomy. Patient is currently taking tylenol  with minimal improvement.  --Sent prescription of tramadol  50 mg PO q 6 hours as needed.   # AKI -- Currently has bilateral nephrostomies, underwent exchange on 08/07/2023. -- Creatinine 1.96  #Supportive Care -- chemotherapy education complete -- port placed -- zofran  8mg  q8H PRN and compazine  10mg  PO q6H for nausea -- EMLA  cream for port -- no pain medication required at this time.    No orders of the defined types were placed in this encounter.  All questions were answered. The patient knows to call the clinic with any problems, questions or concerns.  I have spent a total of 30 minutes minutes of face-to-face and non-face-to-face time, preparing to see the patient, performing a medically appropriate examination, counseling and educating the patient, documenting clinical information in the electronic health record,and care coordination.   Rogerio Clay, MD Department of Hematology/Oncology [facility] at [facility] Phone: [phone] Pager: [phone] Email: [email or web address]

## 2023-08-25 ENCOUNTER — Inpatient Hospital Stay: Attending: Hematology and Oncology | Admitting: Hematology and Oncology

## 2023-08-25 ENCOUNTER — Inpatient Hospital Stay

## 2023-08-25 VITALS — BP 136/68 | HR 56 | Temp 98.1°F | Resp 16 | Wt 248.9 lb

## 2023-08-25 VITALS — BP 116/69 | HR 53 | Temp 98.5°F | Resp 18

## 2023-08-25 DIAGNOSIS — N179 Acute kidney failure, unspecified: Secondary | ICD-10-CM | POA: Diagnosis not present

## 2023-08-25 DIAGNOSIS — C671 Malignant neoplasm of dome of bladder: Secondary | ICD-10-CM | POA: Diagnosis not present

## 2023-08-25 DIAGNOSIS — Z801 Family history of malignant neoplasm of trachea, bronchus and lung: Secondary | ICD-10-CM | POA: Insufficient documentation

## 2023-08-25 DIAGNOSIS — Z95828 Presence of other vascular implants and grafts: Secondary | ICD-10-CM

## 2023-08-25 DIAGNOSIS — Z800 Family history of malignant neoplasm of digestive organs: Secondary | ICD-10-CM | POA: Diagnosis not present

## 2023-08-25 DIAGNOSIS — Z936 Other artificial openings of urinary tract status: Secondary | ICD-10-CM | POA: Insufficient documentation

## 2023-08-25 DIAGNOSIS — Z87891 Personal history of nicotine dependence: Secondary | ICD-10-CM | POA: Diagnosis not present

## 2023-08-25 DIAGNOSIS — C673 Malignant neoplasm of anterior wall of bladder: Secondary | ICD-10-CM | POA: Insufficient documentation

## 2023-08-25 DIAGNOSIS — R109 Unspecified abdominal pain: Secondary | ICD-10-CM | POA: Diagnosis not present

## 2023-08-25 DIAGNOSIS — E876 Hypokalemia: Secondary | ICD-10-CM | POA: Insufficient documentation

## 2023-08-25 DIAGNOSIS — D649 Anemia, unspecified: Secondary | ICD-10-CM | POA: Diagnosis not present

## 2023-08-25 DIAGNOSIS — R7989 Other specified abnormal findings of blood chemistry: Secondary | ICD-10-CM

## 2023-08-25 DIAGNOSIS — R21 Rash and other nonspecific skin eruption: Secondary | ICD-10-CM | POA: Insufficient documentation

## 2023-08-25 DIAGNOSIS — C679 Malignant neoplasm of bladder, unspecified: Secondary | ICD-10-CM

## 2023-08-25 LAB — IRON AND IRON BINDING CAPACITY (CC-WL,HP ONLY)
Iron: 43 ug/dL — ABNORMAL LOW (ref 45–182)
Saturation Ratios: 18 % (ref 17.9–39.5)
TIBC: 246 ug/dL — ABNORMAL LOW (ref 250–450)
UIBC: 203 ug/dL (ref 117–376)

## 2023-08-25 LAB — RETIC PANEL
Immature Retic Fract: 25.9 % — ABNORMAL HIGH (ref 2.3–15.9)
RBC.: 2.82 MIL/uL — ABNORMAL LOW (ref 4.22–5.81)
Retic Count, Absolute: 83.8 10*3/uL (ref 19.0–186.0)
Retic Ct Pct: 3 % (ref 0.4–3.1)
Reticulocyte Hemoglobin: 33.4 pg (ref >27.9)

## 2023-08-25 LAB — CMP (CANCER CENTER ONLY)
ALT: 14 U/L (ref 0–44)
AST: 10 U/L — ABNORMAL LOW (ref 15–41)
Albumin: 3.3 g/dL — ABNORMAL LOW (ref 3.5–5.0)
Alkaline Phosphatase: 96 U/L (ref 38–126)
Anion gap: 5 (ref 5–15)
BUN: 18 mg/dL (ref 8–23)
CO2: 26 mmol/L (ref 22–32)
Calcium: 8.1 mg/dL — ABNORMAL LOW (ref 8.9–10.3)
Chloride: 105 mmol/L (ref 98–111)
Creatinine: 1.96 mg/dL — ABNORMAL HIGH (ref 0.61–1.24)
GFR, Estimated: 36 mL/min — ABNORMAL LOW (ref >60)
Glucose, Bld: 132 mg/dL — ABNORMAL HIGH (ref 70–99)
Potassium: 4.1 mmol/L (ref 3.5–5.1)
Sodium: 136 mmol/L (ref 135–145)
Total Bilirubin: 0.8 mg/dL (ref 0.0–1.2)
Total Protein: 6.9 g/dL (ref 6.5–8.1)

## 2023-08-25 LAB — CBC WITH DIFFERENTIAL (CANCER CENTER ONLY)
Abs Immature Granulocytes: 0.08 10*3/uL — ABNORMAL HIGH (ref 0.00–0.07)
Basophils Absolute: 0 10*3/uL (ref 0.0–0.1)
Basophils Relative: 0 %
Eosinophils Absolute: 3 10*3/uL — ABNORMAL HIGH (ref 0.0–0.5)
Eosinophils Relative: 36 %
HCT: 25.6 % — ABNORMAL LOW (ref 39.0–52.0)
Hemoglobin: 8.4 g/dL — ABNORMAL LOW (ref 13.0–17.0)
Immature Granulocytes: 1 %
Lymphocytes Relative: 5 %
Lymphs Abs: 0.4 10*3/uL — ABNORMAL LOW (ref 0.7–4.0)
MCH: 29.4 pg (ref 26.0–34.0)
MCHC: 32.8 g/dL (ref 30.0–36.0)
MCV: 89.5 fL (ref 80.0–100.0)
Monocytes Absolute: 0.6 10*3/uL (ref 0.1–1.0)
Monocytes Relative: 7 %
Neutro Abs: 4.2 10*3/uL (ref 1.7–7.7)
Neutrophils Relative %: 51 %
Platelet Count: 155 10*3/uL (ref 150–400)
RBC: 2.86 MIL/uL — ABNORMAL LOW (ref 4.22–5.81)
RDW: 17.2 % — ABNORMAL HIGH (ref 11.5–15.5)
WBC Count: 8.3 10*3/uL (ref 4.0–10.5)
nRBC: 0 % (ref 0.0–0.2)

## 2023-08-25 LAB — FERRITIN: Ferritin: 795 ng/mL — ABNORMAL HIGH (ref 24–336)

## 2023-08-25 MED ORDER — SODIUM CHLORIDE 0.9 % IV SOLN: INTRAVENOUS | Status: DC

## 2023-08-25 MED ORDER — HEPARIN SOD (PORK) LOCK FLUSH 100 UNIT/ML IV SOLN: 250.0000 [IU] | Freq: Once | INTRAVENOUS | Status: DC | PRN

## 2023-08-25 MED ORDER — FERRIC DERISOMALTOSE(ONE DOSE) 1000 MG/10ML IV SOLN
1000.0000 mg | Freq: Once | INTRAVENOUS | Status: AC
  Administered 2023-08-25: 1000 mg via INTRAVENOUS
  Filled 2023-08-25: qty 10

## 2023-08-25 MED ORDER — HEPARIN SOD (PORK) LOCK FLUSH 100 UNIT/ML IV SOLN
500.0000 [IU] | Freq: Once | INTRAVENOUS | Status: AC | PRN
  Administered 2023-08-25: 500 [IU]

## 2023-08-25 MED ORDER — ALTEPLASE 2 MG IJ SOLR: 2.0000 mg | Freq: Once | INTRAMUSCULAR | Status: DC | PRN

## 2023-08-25 MED ORDER — SODIUM CHLORIDE 0.9% FLUSH: 3.0000 mL | Freq: Once | INTRAVENOUS | Status: DC | PRN

## 2023-08-25 MED ORDER — CYANOCOBALAMIN 1000 MCG/ML IJ SOLN
1000.0000 ug | Freq: Once | INTRAMUSCULAR | Status: AC
  Administered 2023-08-25: 1000 ug via INTRAMUSCULAR
  Filled 2023-08-25: qty 1

## 2023-08-25 MED ORDER — PREDNISONE 20 MG PO TABS: ORAL_TABLET | ORAL | 0 refills | Status: AC

## 2023-08-25 MED ORDER — SODIUM CHLORIDE 0.9% FLUSH
10.0000 mL | Freq: Once | INTRAVENOUS | Status: AC | PRN
  Administered 2023-08-25: 10 mL

## 2023-08-25 MED ORDER — SODIUM CHLORIDE 0.9% FLUSH
10.0000 mL | Freq: Once | INTRAVENOUS | Status: AC
Start: 2023-08-25 — End: 2023-08-25
  Administered 2023-08-25: 10 mL

## 2023-08-25 NOTE — Patient Instructions (Signed)
 [facility] - A DEPT OF [facility]  Discharge Instructions: Thank you for choosing [facility] to provide your oncology and hematology care.   If you have a lab appointment with the Cancer Center, please go directly to the Cancer Center and check in at the registration area.   Wear comfortable clothing and clothing appropriate for easy access to any Portacath or PICC line.   We strive to give you quality time with your provider. You may need to reschedule your appointment if you arrive late (15 or more minutes).  Arriving late affects you and other patients whose appointments are after yours.  Also, if you miss three or more appointments without notifying the office, you may be dismissed from the clinic at the provider's discretion.      For prescription refill requests, have your pharmacy contact our office and allow 72 hours for refills to be completed.    Today you received the following Monoferric .    Ferric Derisomaltose  Injection What is this medication? FERRIC DERISOMALTOSE  (FER ik der EYE soe MAWL tose) treats low levels of iron in your body (iron deficiency anemia). Iron is a mineral that plays an important role in making red blood cells, which carry oxygen from your lungs to the rest of your body. This medicine may be used for other purposes; ask your health care provider or pharmacist if you have questions. COMMON BRAND NAME(S): MONOFERRIC  What should I tell my care team before I take this medication? They need to know if you have any of these conditions: High levels of iron in the blood An unusual or allergic reaction to iron, other medications, foods, dyes, or preservatives Pregnant or trying to get pregnant Breastfeeding How should I use this medication? This medication is injected into a vein. It is given by your care team in a hospital or clinic setting. Talk to your care team about the use of this medication in children.  Special care may be needed. Overdosage: If you think you have taken too much of this medicine contact a poison control center or emergency room at once. NOTE: This medicine is only for you. Do not share this medicine with others. What if I miss a dose? It is important not to miss your dose. Call your care team if you are unable to keep an appointment. What may interact with this medication? Do not take this medication with any of the following: Deferoxamine Dimercaprol Other iron products This list may not describe all possible interactions. Give your health care provider a list of all the medicines, herbs, non-prescription drugs, or dietary supplements you use. Also tell them if you smoke, drink alcohol, or use illegal drugs. Some items may interact with your medicine. What should I watch for while using this medication? Visit your care team for regular checks on your progress. Tell your care team if your symptoms do not start to get better or if they get worse. You may need blood work done while you are taking this medication. You may need to eat more foods that contain iron. Talk to your care team. Foods that contain iron include whole grains or cereals, dried fruits, beans, peas, leafy green vegetables, and organ meats (liver, kidney). What side effects may I notice from receiving this medication? Side effects that you should report to your care team as soon as possible: Allergic reactions--skin rash, itching, hives, swelling of the face, lips, tongue, or throat Low blood  pressure--dizziness, feeling faint or lightheaded, blurry vision Shortness of breath Side effects that usually do not require medical attention (report to your care team if they continue or are bothersome): Flushing Headache Joint pain Muscle pain Nausea Pain, redness, or irritation at injection site This list may not describe all possible side effects. Call your doctor for medical advice about side effects. You may  report side effects to FDA at 1-800-FDA-1088. Where should I keep my medication? This medication is given in a hospital or clinic. It will not be stored at home. NOTE: This sheet is a summary. It may not cover all possible information. If you have questions about this medicine, talk to your doctor, pharmacist, or health care provider.  2024 Elsevier/Gold Standard (2022-11-30 00:00:00)   To help prevent nausea and vomiting after your treatment, we encourage you to take your nausea medication as directed.  BELOW ARE SYMPTOMS THAT SHOULD BE REPORTED IMMEDIATELY: *FEVER GREATER THAN 100.4 F (38 C) OR HIGHER *CHILLS OR SWEATING *NAUSEA AND VOMITING THAT IS NOT CONTROLLED WITH YOUR NAUSEA MEDICATION *UNUSUAL SHORTNESS OF BREATH *UNUSUAL BRUISING OR BLEEDING *URINARY PROBLEMS (pain or burning when urinating, or frequent urination) *BOWEL PROBLEMS (unusual diarrhea, constipation, pain near the anus) TENDERNESS IN MOUTH AND THROAT WITH OR WITHOUT PRESENCE OF ULCERS (sore throat, sores in mouth, or a toothache) UNUSUAL RASH, SWELLING OR PAIN  UNUSUAL VAGINAL DISCHARGE OR ITCHING   Items with * indicate a potential emergency and should be followed up as soon as possible or go to the Emergency Department if any problems should occur.  Please show the CHEMOTHERAPY ALERT CARD or IMMUNOTHERAPY ALERT CARD at check-in to the Emergency Department and triage nurse.  Should you have questions after your visit or need to cancel or reschedule your appointment, please contact [facility] - A DEPT OF [facility]  Dept: [phone]  and follow the prompts.  Office hours are 8:00 a.m. to 4:30 p.m. Monday - Friday. Please note that voicemails left after 4:00 p.m. may not be returned until the following business day.  We are closed weekends and major holidays. You have access to a nurse at all times for urgent questions. Please call the main number to the clinic Dept: [phone] and  follow the prompts.   For any non-urgent questions, you may also contact your provider using MyChart. We now offer e-Visits for anyone 16 and older to request care online for non-urgent symptoms. For details visit [email or web address].   Also download the MyChart app! Go to the app store, search "MyChart", open the app, select [place], and log in with your MyChart username and password.

## 2023-08-25 NOTE — Progress Notes (Signed)
 Patient presents today for first time of Monoferric  iron infusion and B12 injection.  Patient is in satisfactory condition with no new complaints voiced.  Vital signs are stable. Patient seen by Dr Amparo Balk today prior to infusion. We will proceed with infusion per provider orders.    Patient waited recommended 30 minute wait time. No symptoms or complaints given.  Patient tolerated injection in left arm well with no complaints voiced.  Site clean and dry with no bruising or swelling noted.  No complaints of pain.  Patient tolerated treatment well with no complaints voiced.  Patient left ambulatory in stable condition.  Vital signs stable at discharge.  Follow up as scheduled.

## 2023-08-26 DIAGNOSIS — Z936 Other artificial openings of urinary tract status: Secondary | ICD-10-CM | POA: Diagnosis not present

## 2023-08-26 DIAGNOSIS — Z8551 Personal history of malignant neoplasm of bladder: Secondary | ICD-10-CM | POA: Diagnosis not present

## 2023-08-26 DIAGNOSIS — G4733 Obstructive sleep apnea (adult) (pediatric): Secondary | ICD-10-CM | POA: Diagnosis not present

## 2023-08-28 DIAGNOSIS — Z936 Other artificial openings of urinary tract status: Secondary | ICD-10-CM | POA: Diagnosis not present

## 2023-08-28 DIAGNOSIS — Z8551 Personal history of malignant neoplasm of bladder: Secondary | ICD-10-CM | POA: Diagnosis not present

## 2023-08-28 DIAGNOSIS — C678 Malignant neoplasm of overlapping sites of bladder: Secondary | ICD-10-CM | POA: Diagnosis not present

## 2023-08-28 DIAGNOSIS — C775 Secondary and unspecified malignant neoplasm of intrapelvic lymph nodes: Secondary | ICD-10-CM | POA: Diagnosis not present

## 2023-08-29 ENCOUNTER — Ambulatory Visit
Admission: RE | Admit: 2023-08-29 | Discharge: 2023-08-29 | Disposition: A | Discharge status: Home / Self Care | Source: Ambulatory Visit | Attending: Family Medicine | Admitting: Family Medicine

## 2023-08-29 NOTE — Progress Notes (Signed)
  Radiation Oncology         [phone] ________________________________  Name: Luis [name] MRN: [id number]  Date of Service: 08/29/2023  DOB: 1956-03-21  Post Treatment Telephone Note  Diagnosis:  C67.8 Malignant neoplasm of overlapping sites of bladder (as documented in provider EOT note)  Pre Treatment IPSS Score: N/A- Urinary Bag in place.  The patient was available for call today.   Symptoms of fatigue have improved since completing therapy.  Symptoms of bladder changes have improved since completing therapy. Current symptoms include none, and medications for bladder symptoms include none.  Symptoms of bowel changes have improved since completing therapy. Current symptoms include none, and medications for bowel symptoms include none.   Post Treatment IPSS Score: N/A- Urinary Bag in place.  Patient will follow up visit with his urologist, Dr. Rosaline Coma, on 09/22/23 for ongoing surveillance. He was counseled that PSA levels will be drawn in the urology office, and was reassured that additional time is expected to improve bowel and bladder symptoms. He was encouraged to call back with concerns or questions regarding radiation.  This concludes the interaction.  Avery Bodo, LPN

## 2023-09-05 ENCOUNTER — Other Ambulatory Visit: Payer: Self-pay | Admitting: Physician Assistant

## 2023-09-05 DIAGNOSIS — C679 Malignant neoplasm of bladder, unspecified: Secondary | ICD-10-CM

## 2023-09-06 ENCOUNTER — Inpatient Hospital Stay

## 2023-09-06 DIAGNOSIS — Z95828 Presence of other vascular implants and grafts: Secondary | ICD-10-CM

## 2023-09-06 DIAGNOSIS — C679 Malignant neoplasm of bladder, unspecified: Secondary | ICD-10-CM

## 2023-09-06 DIAGNOSIS — C671 Malignant neoplasm of dome of bladder: Secondary | ICD-10-CM | POA: Diagnosis not present

## 2023-09-06 LAB — CBC WITH DIFFERENTIAL (CANCER CENTER ONLY)
Abs Immature Granulocytes: 0.25 10*3/uL — ABNORMAL HIGH (ref 0.00–0.07)
Basophils Absolute: 0.1 10*3/uL (ref 0.0–0.1)
Basophils Relative: 1 %
Eosinophils Absolute: 0.2 10*3/uL (ref 0.0–0.5)
Eosinophils Relative: 2 %
HCT: 29.5 % — ABNORMAL LOW (ref 39.0–52.0)
Hemoglobin: 9.5 g/dL — ABNORMAL LOW (ref 13.0–17.0)
Immature Granulocytes: 2 %
Lymphocytes Relative: 5 %
Lymphs Abs: 0.5 10*3/uL — ABNORMAL LOW (ref 0.7–4.0)
MCH: 30 pg (ref 26.0–34.0)
MCHC: 32.2 g/dL (ref 30.0–36.0)
MCV: 93.1 fL (ref 80.0–100.0)
Monocytes Absolute: 0.9 10*3/uL (ref 0.1–1.0)
Monocytes Relative: 8 %
Neutro Abs: 8.9 10*3/uL — ABNORMAL HIGH (ref 1.7–7.7)
Neutrophils Relative %: 82 %
Platelet Count: 136 10*3/uL — ABNORMAL LOW (ref 150–400)
RBC: 3.17 MIL/uL — ABNORMAL LOW (ref 4.22–5.81)
RDW: 17.4 % — ABNORMAL HIGH (ref 11.5–15.5)
WBC Count: 10.7 10*3/uL — ABNORMAL HIGH (ref 4.0–10.5)
nRBC: 0 % (ref 0.0–0.2)

## 2023-09-06 LAB — CMP (CANCER CENTER ONLY)
ALT: 36 U/L (ref 0–44)
AST: 21 U/L (ref 15–41)
Albumin: 3.6 g/dL (ref 3.5–5.0)
Alkaline Phosphatase: 98 U/L (ref 38–126)
Anion gap: 7 (ref 5–15)
BUN: 35 mg/dL — ABNORMAL HIGH (ref 8–23)
CO2: 28 mmol/L (ref 22–32)
Calcium: 8.6 mg/dL — ABNORMAL LOW (ref 8.9–10.3)
Chloride: 101 mmol/L (ref 98–111)
Creatinine: 1.72 mg/dL — ABNORMAL HIGH (ref 0.61–1.24)
GFR, Estimated: 42 mL/min — ABNORMAL LOW (ref >60)
Glucose, Bld: 122 mg/dL — ABNORMAL HIGH (ref 70–99)
Potassium: 4.3 mmol/L (ref 3.5–5.1)
Sodium: 136 mmol/L (ref 135–145)
Total Bilirubin: 0.8 mg/dL (ref 0.0–1.2)
Total Protein: 7.4 g/dL (ref 6.5–8.1)

## 2023-09-06 MED ORDER — SODIUM CHLORIDE 0.9% FLUSH
10.0000 mL | Freq: Once | INTRAVENOUS | Status: AC
Start: 2023-09-06 — End: 2023-09-06
  Administered 2023-09-06: 10 mL

## 2023-09-06 MED ORDER — HEPARIN SOD (PORK) LOCK FLUSH 100 UNIT/ML IV SOLN
500.0000 [IU] | Freq: Once | INTRAVENOUS | Status: AC
  Administered 2023-09-06: 500 [IU]

## 2023-09-06 NOTE — Addendum Note (Signed)
 Encounter addended by: Adam Holm on: 09/06/2023 3:30 PM  Actions taken: Imaging Exam ended

## 2023-09-13 NOTE — Progress Notes (Signed)
 Patient ID: Luis [name], male   DOB: 12/29/1955, 68 y.o.   MRN: [id number] PCP: Dr. Alyne Babinski Cardiology: Dr. Mitzie Anda  CC: HF follow up  68 y.o. with history of CAD and moderate aortic stenosis presents for follow of AS and CAD. He had RCA PCI in 8/12. Metastatic bladder cancer previously treated with chemo and TURBT, now s/p cystoprostatectomy and lymph node dissection 11/24.  When I saw him in early 2016, he reported significant exertional dyspnea.  Did Lexiscan  Cardiolite  in 2/16.  This showed no ischemia or infarction.  Echo showed ?bicuspid aortic valve and probably moderate aortic stenosis. Given echo results RHC/LHC and TEE in 2/16. The RHC showed mildly elevated left and right heart filling pressures and the LHC showed nonobstructive CAD.  TEE confirmed bicuspid aortic valve with moderate AS. PFTs in 4/16 that were within normal limits. Started on Lasix  20 mg daily.  He seemed to improve.    Progressive dypnea in fall 2017.  RHC/LHC in 2017 showed mildly elevated left heart filling pressure and nonobstructive CAD.  TEE showed moderate aortic stenosis, bicuspid valve. Stable ascending aorta dilation.   Echo in 9/18 showed EF 60-65% with bicuspid aortic valve and moderate AS.  CTA chest showed 4.5 cm ascending aorta.  Repeat CTA chest in 8/19 showed stable 4.5 cm ascending aorta.  Echo in 8/20 showed EF 60-65%, moderate AS, 4.5 cm ascending aorta. CTA chest in 8/20 with 4.6 cm ascending aorta. Echo in 1/22 showed EF 60-65%, mild LVH, moderate AS with mean gradient 32 and AVA 1.6 cm^2, bicuspid aortic valve, ascending aorta 46 mm. CTA chest in 8/22 with 4.4 cm ascending aorta.   Echo 3/23 showed EF 60-65% with mild LVH, normal RV, bicuspid aortic valve with severe AS and mean gradient 48 mmHg, AVA 0.91 cm^2.  R/LHC showed mild CAD, patent RCA stents, normal PCWP, mildly elevated RA pressure, preserved CO.  Underwent AV replacement and replacement of ascending aorta with Dr. Sherene Dilling 5/23. Had post-op  atrial fibrillation, started on amiodarone /warfarin. Presented back to ED 1 week after discharge with LOC. Echo showed moderate pericardial effusion with maximal diameter of 3 cm over the right ventricle with some compression, LV and aortic valve function normal. Attempted pericardiocentesis in cath lab, but unsuccessful and placed sub-xiphoid pericardial window in OR, with 850 cc fluid removed. Discharged home, weight 275 lbs.  Echo 7/23 showed EF 60-65%, normal RV, stable bioprosthetic aortic valve.  Echo 8/24 showed EF 60-65%, normal RV, stable bioprosthetic aortic valve  He had hematuria and found to have recurrence of bladder cancer.  S/p cystoprostatectomy and lymph node dissection 03/03/23.    Admitted 12/24 with sepsis & AKI. BCx grew Enterococcus faecalis. ID consulted and he required vasopressors and ICU care. Echo showed EF 65-70%, RV normal. TEE showed EF 60-65%, RV normal, no valvular vegetation or thrombus. He was discharged home with PICC on IV abx.  RHC was done in 2/25 showing mean RA 12, PA 40/13, mean PCWP 11, CI 3.53, PVR 2.05, PAPi 2.75.  Lasix  restarted at 20 mg daily.   Today he returns for HF follow up with his wife. Overall feeling fair. Appetite not great, sleeping a lot and remains fatigued. He is SOB walking up hills or up steps, otherwise breathing is OK. Abdomen feels fuller, no issues with urostomy tubes (3 total). Denies palpitations, abnormal bleeding, CP, dizziness, edema, or PND/Orthopnea. Weight at home 246  pounds. Taking all medications. Renal function & anemia preventing him from receiving chemo for bladder  cancer, he has completed radiation.   ReDs reading: 36 %, abnormal  ECG (personally reviewed): none ordered today.  Labs (6/23): K 4.1, creatinine 1.0, hgb 8.3, Lp(a) 116 Labs (10/23): K 4.0, creatinine 1.34, LDL 71 Labs (7/24): K 3.7, creatinine 1.10, hgb 14.5 Labs (9/24): K 3.7, creatinine 1.19 Labs (11/24): K 3.5, creatinine 0.92, hgb 10.1 Labs  (1/25): K 4.2, creatinine 1.98 Labs (2/25): transferrin saturation 13%, hgb 7.9, K 4.3, creatinine 2.07 Labs (5/25): K 4.3, creatinine 1.72, hgb 9.5  PMH: 1. OSA: Using CPAP.  2. CAD: Cardiolite  8/12 with inferior infarct and peri-infarct ischemia, had PCI to the mid and distal RCA with Promus DES x 2.  Lexiscan  Cardiolite  (2/16) with no ischemia or infarction. LHC/RHC (2/16) with mean RA 12, PA 32/15, mean PCWP 18, CI 3.47; patent mid and distal RCA stents, 50-60% proximal stenosis small PDA.   - LHC/RHC (9/17): 60% proximal PDA; mean RA 2, PA 24/8, mean PCWP 18, CI 2.13.  - Cardiolite  (8/17): EF 56%, normal.  - R/LHC (3/23, AVR work up): mid RCA lesion 40% stenosis, previously placed prox RCA to Mid RCA stent (unknown type) widely patent, previously placed dist RCA stent (unknown type) widely patent; mean RA 13, PA 23/8, mean PCWP 11, CI 2.65.  3. Carotid stenosis: 2/15 carotid dopplers 1-39% bilateral ICA stenosis.  4. Colon polyps 5. HTN 6. Hyperlipidemia 7. Abdominal US  (10/12) with no AAA 8. Aortic stenosis: Echo (2/15) with EF 60-65%, mild LVH, moderate diastolic dysfunction, normal RV size/systolic function, moderate aortic stenosis with mean gradient 31 and AVA 1.16 cm2, ascending aorta 4.4 cm.  Echo (2/16) with EF 60-65%, grade II diastolic dysfunction, ?bicuspid aortic valve with moderate AS (mean gradient 35 mmHg, AVA 1.5 cm^2), mildly dilated RV with normal systolic function.  TEE (2/16) with bicuspid aortic valve, moderate AS mean gradient 30 mmHg, AVA > 1 cm^2, EF 55-60%, mild LVH, mildly dilated RV with normal systolic function, mild AI, mild MR, ascending aorta 4.2 cm.  - Echo (8/17): EF 60-65%, grade II diastolic dysfunction, bicuspid aortic valve with mean gradient 31 mmHg, probably moderate stenosis.  - TEE (9/17): EF 55-60%, bicuspid aortic valve with moderate AS with mean gradient 24 mmHg and AVA 1.05 cm^2, ascending aorta 4.2 cm.  - Echo (9/18): EF 60-65%, bicuspid aortic  valve, AVA 1.05 cm^2 with mean gradient 24 mmHg (moderate AS).  - Echo (8/20): EF 60-65%, mild RV dilation with normal systolic function, moderate AS with AVA 1.46 cm^2 and mean gradient 36 mmHg, 4.5 cm ascending aorta.  - Echo (1/22): EF 60-65%, mild LVH, moderate AS with mean gradient 32 and AVA 1.6 cm^2, bicuspid aortic valve, ascending aorta 46 mm.  - Echo (3/23): EF 60-65% with mild LVH, normal RV, bicuspid aortic valve with severe AS and mean gradient 48 mmHg, AVA 0.91 cm^2. 4.5 cm ascending aorta.  - s/p bioprosthetic AVR with a 25 mm Edwards Resilia Aortic Valve and Replacement of Ascending Aorta (5/23). - Echo (7/23): EF 60-65%, normal RV, stable bioprosthetic aortic valve.  - Echo (8/24): EF 60-65%, normal RV, stable bioprosthetic aortic valve - Echo (12/24): EF 60-65%, normal RV, stable bioprosthetic aortic valve. - TEE (12/24): EF 60-65%, normal RV, stable bioprosthetic aortic valve, no vegetation or thrombus. - RHC (2/25): mean RA 12, PA 40/13, mean PCWP 11, CI 3.53, PVR 2.05, PAPi 2.75. 9. Ascending aorta aneurysm: 4.4 cm on 2/15 echo. Ascending aorta 4.2 cm TEE 2/16.  Ascending aorta 4.2 cm on 9/17 TEE.  - CTA  chest (9/18): 4.5 cm ascending aorta.  - CTA chest (8/19): 4.5 cm ascending aorta - CTA chest (8/20): 4.6 cm ascending aorta - CTA chest (8/22): 4.4 cm ascending aorta - replacement of ascending aorta 5/23. 10. Chronic diastolic CHF.  11. PFTs (4/16) were within normal limits.  12. Lower extremity arterial dopplers normal in 8/17.  13. Bladder cancer: S/p chemo and TURBT.  Recurrence with cystoprostatectomy and lymph node dissection 03/03/23. 14. Pericardial tamponade with pericardial window s/p AVR in 5/23.  15. Atrial fibrillation: Paroxysmal, noted post-op AVR.  16. Anemia: Fe deficiency.   SH: Quit smoking in 2013, married, works as Curator.  FH: Father with MI, AVR.  Sister with RyR2 gene.   ROS: All systems reviewed and negative except as per HPI.   Current  Outpatient Medications  Medication Sig Dispense Refill   acetaminophen  (TYLENOL ) 325 MG tablet Take 650 mg by mouth every 6 (six) hours as needed (for pain).     amLODipine  (NORVASC ) 5 MG tablet Take 1 tablet (5 mg total) by mouth daily. 90 tablet 1   aspirin  EC 81 MG tablet Take 81 mg by mouth in the morning.     carvedilol  (COREG ) 3.125 MG tablet TAKE 1 TABLET BY MOUTH TWICE A DAY WITH FOOD 180 tablet 1   cyanocobalamin  (VITAMIN B12) 1000 MCG tablet Take 1 tablet (1,000 mcg total) by mouth daily. 30 tablet 6   diazepam  (VALIUM ) 5 MG tablet TAKE 1 TABLET BY MOUTH EVERY 12 HOURS AS NEEDED FOR ANXIETY. 60 tablet 5   famotidine  (PEPCID ) 20 MG tablet Take 1 tablet (20 mg total) by mouth 2 (two) times daily for 6 days. (Patient taking differently: Take 20 mg by mouth 2 (two) times daily. As needed) 12 tablet 0   furosemide  (LASIX ) 20 MG tablet Take 1 tablet (20 mg total) by mouth daily. 45 tablet 3   ipratropium (ATROVENT ) 0.03 % nasal spray Place 2 sprays into both nostrils every 12 (twelve) hours as needed for rhinitis.     lidocaine -prilocaine  (EMLA ) cream Apply 1 Application topically as needed. 30 g 0   Magnesium  500 MG TABS Take 500 mg by mouth every morning.     meclizine  (ANTIVERT ) 25 MG tablet Take 1 tablet (25 mg total) by mouth 3 (three) times daily as needed for dizziness. 30 tablet 0   multivitamin-iron-minerals-folic acid  (CENTRUM) chewable tablet Chew 1 tablet by mouth daily.     nitroGLYCERIN  (NITROSTAT ) 0.4 MG SL tablet Place 0.4 mg under the tongue every 5 (five) minutes as needed for chest pain.     ondansetron  (ZOFRAN ) 8 MG tablet Take 1 tablet (8 mg total) by mouth every 8 (eight) hours as needed. 30 tablet 0   pantoprazole  (PROTONIX ) 40 MG tablet Take 1 tablet (40 mg total) by mouth 2 (two) times daily before a meal. 60 tablet 5   potassium chloride  SA (KLOR-CON  M20) 20 MEQ tablet Take 1 tablet (20 mEq total) by mouth daily. (Patient taking differently: Take 20 mEq by mouth 2  (two) times daily.)     prochlorperazine  (COMPAZINE ) 10 MG tablet Take 1 tablet (10 mg total) by mouth every 6 (six) hours as needed for nausea or vomiting. 30 tablet 0   rosuvastatin  (CRESTOR ) 20 MG tablet Take 1 tablet (20 mg total) by mouth daily. 90 tablet 3   traMADol  (ULTRAM ) 50 MG tablet Take 1 tablet (50 mg total) by mouth every 6 (six) hours as needed. 30 tablet 0   venlafaxine  XR (EFFEXOR -XR) 150  MG 24 hr capsule TAKE 1 CAPSULE BY MOUTH DAILY WITH BREAKFAST. 90 capsule 0   No current facility-administered medications for this encounter.   Wt Readings from Last 3 Encounters:  09/15/23 113.8 kg (250 lb 12.8 oz)  08/25/23 112.9 kg (248 lb 14.4 oz)  08/14/23 119 kg (262 lb 4 oz)   BP 120/70   Pulse (!) 59   Wt 113.8 kg (250 lb 12.8 oz)   SpO2 99%   BMI 40.48 kg/m   Physical Exam General:  NAD. No resp difficulty, walked into clinic HEENT: Normal Neck: Supple. No JVD. Cor: Regular rate & rhythm. No rubs, gallops, 2/6 SEM RUSB Lungs: Clear Abdomen: Soft, obese, nontender, nondistended, + urostomy tubes Extremities: No cyanosis, clubbing, rash, edema Neuro: Alert & oriented x 3, moves all 4 extremities w/o difficulty. Affect pleasant.  Assessment/Plan: 1. CAD: Prior PCI to RCA in 8/12.  LHC (9/17) with nonobstructive disease. He was started on Imdur for ?microvascular angina but he developed severe headaches without any improvement in symptoms.  Underwent coronary angiography (3/23) as part of AVR work up and showed patent RCA stents with nonobstructive mild disease. No chest pain now.  - Continue ASA 81 and Crestor .  2. Aortic stenosis: Bicuspid valve with severe AS on 3/23 echo.  He is now s/p bioprosthetic AV replacement and replacement of ascending aorta 5/23. TEE in 12/24 at time of Enterococcus faecalis bacteremia showed stable bioprosthetic valve. 3. Hyperlipidemia: Continue Crestor .  4. Ascending aortic aneurysm: Associated with bicuspid aortic valve.  S/p replacement of  ascending aorta 5/23.  5. Chronic diastolic CHF: Stable NYHA class II symptoms. Lasix  restarted after 2/25 RHC with mildly elevated right-sided filling pressures. He is not markedly volume overloaded on exam, REDs very mildly elevated at 36%. - Avoiding Jardiance  for now with bladder cancer and recent operation.  - Continue Lasix  20 mg daily. Recent labs reviewed and are stable. OK to take extra 20 mg PRN - Continue carvedilol  3.125 mg bid.  - Repeat echo at next visit. 6. OSA: Continue CPAP.  7. HTN: BP controlled.  - Continue amlodipine  5 mg daily.  8. Pericardial effusion: s/p pericardial window 6/23 post-op.  9. Atrial fibrillation: Paroxysmal, only noted post-op. Anticoagulation was stopped with pericardial effusion and later hematuria. Regular on exam today. - If AF recurs, will need DOAC.  10. Metastatic Bladder CA: Treated with pembrolizumab . Now S/p cystoprostatectomy and lymph node dissection 03/03/23. He has started radiation, planning chemotherapy when anemia and renal function stabilize.   11. Fe deficiency anemia: Transferrin saturation 13%, he has received iron transfusion.   Follow up in 4 months with Dr. Mitzie Anda + echo. He has frequent lab monitoring at cancer center.  Arlice Bene [place], FNP-BC 09/15/2023

## 2023-09-14 ENCOUNTER — Telehealth (HOSPITAL_COMMUNITY): Payer: Self-pay

## 2023-09-14 NOTE — Telephone Encounter (Signed)
 Called to confirm/remind patient of their appointment at the [facility] on 09/15/23.   Appointment:   [x] Confirmed  [] Left mess   [] No answer/No voice mail  [] VM Full/unable to leave message  [] Phone not in service  Patient reminded to bring all medications and/or complete list.  Confirmed patient has transportation. Gave directions, instructed to utilize valet parking.

## 2023-09-15 ENCOUNTER — Ambulatory Visit (HOSPITAL_COMMUNITY)
Admission: RE | Admit: 2023-09-15 | Discharge: 2023-09-15 | Disposition: A | Discharge status: Home / Self Care | Payer: PPO | Source: Ambulatory Visit | Attending: Family Medicine | Admitting: Family Medicine

## 2023-09-15 ENCOUNTER — Encounter (HOSPITAL_COMMUNITY): Payer: Self-pay

## 2023-09-15 DIAGNOSIS — D509 Iron deficiency anemia, unspecified: Secondary | ICD-10-CM | POA: Insufficient documentation

## 2023-09-15 DIAGNOSIS — G4733 Obstructive sleep apnea (adult) (pediatric): Secondary | ICD-10-CM | POA: Diagnosis not present

## 2023-09-15 DIAGNOSIS — Z952 Presence of prosthetic heart valve: Secondary | ICD-10-CM | POA: Diagnosis not present

## 2023-09-15 DIAGNOSIS — I480 Paroxysmal atrial fibrillation: Secondary | ICD-10-CM | POA: Diagnosis not present

## 2023-09-15 DIAGNOSIS — I5032 Chronic diastolic (congestive) heart failure: Secondary | ICD-10-CM

## 2023-09-15 DIAGNOSIS — Z953 Presence of xenogenic heart valve: Secondary | ICD-10-CM | POA: Diagnosis not present

## 2023-09-15 DIAGNOSIS — Z79899 Other long term (current) drug therapy: Secondary | ICD-10-CM | POA: Diagnosis not present

## 2023-09-15 DIAGNOSIS — Z7901 Long term (current) use of anticoagulants: Secondary | ICD-10-CM | POA: Insufficient documentation

## 2023-09-15 DIAGNOSIS — Z8551 Personal history of malignant neoplasm of bladder: Secondary | ICD-10-CM | POA: Insufficient documentation

## 2023-09-15 DIAGNOSIS — Z9221 Personal history of antineoplastic chemotherapy: Secondary | ICD-10-CM | POA: Insufficient documentation

## 2023-09-15 DIAGNOSIS — Q2381 Bicuspid aortic valve: Secondary | ICD-10-CM | POA: Diagnosis not present

## 2023-09-15 DIAGNOSIS — I10 Essential (primary) hypertension: Secondary | ICD-10-CM

## 2023-09-15 DIAGNOSIS — Z955 Presence of coronary angioplasty implant and graft: Secondary | ICD-10-CM | POA: Insufficient documentation

## 2023-09-15 DIAGNOSIS — I110 Hypertensive heart disease with heart failure: Secondary | ICD-10-CM | POA: Insufficient documentation

## 2023-09-15 DIAGNOSIS — C679 Malignant neoplasm of bladder, unspecified: Secondary | ICD-10-CM | POA: Diagnosis not present

## 2023-09-15 DIAGNOSIS — I2510 Atherosclerotic heart disease of native coronary artery without angina pectoris: Secondary | ICD-10-CM | POA: Diagnosis not present

## 2023-09-15 DIAGNOSIS — Z923 Personal history of irradiation: Secondary | ICD-10-CM | POA: Diagnosis not present

## 2023-09-15 DIAGNOSIS — I3139 Other pericardial effusion (noninflammatory): Secondary | ICD-10-CM | POA: Diagnosis not present

## 2023-09-15 DIAGNOSIS — C775 Secondary and unspecified malignant neoplasm of intrapelvic lymph nodes: Secondary | ICD-10-CM | POA: Diagnosis not present

## 2023-09-15 DIAGNOSIS — I350 Nonrheumatic aortic (valve) stenosis: Secondary | ICD-10-CM | POA: Insufficient documentation

## 2023-09-15 DIAGNOSIS — I25119 Atherosclerotic heart disease of native coronary artery with unspecified angina pectoris: Secondary | ICD-10-CM | POA: Diagnosis not present

## 2023-09-15 DIAGNOSIS — Z87891 Personal history of nicotine dependence: Secondary | ICD-10-CM | POA: Diagnosis not present

## 2023-09-15 DIAGNOSIS — E785 Hyperlipidemia, unspecified: Secondary | ICD-10-CM

## 2023-09-15 NOTE — Addendum Note (Signed)
 Encounter addended by: Dewey Fordyce, CMA on: 09/15/2023 2:53 PM  Actions taken: Visit diagnoses modified, Order list changed, Diagnosis association updated, Flowsheet accepted, Clinical Note Signed, Charge Capture section accepted

## 2023-09-15 NOTE — Addendum Note (Signed)
 Encounter addended by: Nida Barrow, RN on: 09/15/2023 2:36 PM  Actions taken: Clinical Note Signed

## 2023-09-15 NOTE — Progress Notes (Signed)
 ReDS Vest / Clip - 09/15/23 1400       ReDS Vest / Clip   Station Marker D    Ruler Value 35    ReDS Value Range Moderate volume overload    ReDS Actual Value 36

## 2023-09-15 NOTE — Patient Instructions (Addendum)
 Thank you for coming in today  If you had labs drawn today, any labs that are abnormal the clinic will call you No news is good news  Medications: No changes  Follow up appointments:  Your physician recommends that you schedule a follow-up appointment in:  4 months With Dr. Mclean with echocardiogram Please call our office to schedule the follow-up appointment in July 2025 for September 2025.   Do the following things EVERYDAY: Weigh yourself in the morning before breakfast. Write it down and keep it in a log. Take your medicines as prescribed Eat low salt foods--Limit salt (sodium) to 2000 mg per day.  Stay as active as you can everyday Limit all fluids for the day to less than 2 liters   At the [facility], you and your health needs are our priority. As part of our continuing mission to provide you with exceptional heart care, we have created designated Provider Care Teams. These Care Teams include your primary Cardiologist (physician) and Advanced Practice Providers (APPs- Physician Assistants and Nurse Practitioners) who all work together to provide you with the care you need, when you need it.   You may see any of the following providers on your designated Care Team at your next follow up: Dr Jules Oar Dr Peder Bourdon Dr. Mimi Alt, NP Ruddy Corral, [place] [facility] [place], [place] Dennise Fitz, NP Luster Salters, PharmD   Please be sure to bring in all your medications bottles to every appointment.    Thank you for choosing [facility]  If you have any questions or concerns before your next appointment please send us  a message through [place] or call our office at [phone].    TO LEAVE A MESSAGE FOR THE NURSE SELECT OPTION 2, PLEASE LEAVE A MESSAGE INCLUDING: YOUR NAME DATE OF BIRTH CALL BACK NUMBER REASON FOR CALL**this is important as we prioritize the call backs  YOU  WILL RECEIVE A CALL BACK THE SAME DAY AS LONG AS YOU CALL BEFORE 4:00 PM

## 2023-09-17 ENCOUNTER — Other Ambulatory Visit: Payer: Self-pay | Admitting: Hematology and Oncology

## 2023-09-19 ENCOUNTER — Encounter: Payer: Self-pay | Admitting: Hematology and Oncology

## 2023-09-20 DIAGNOSIS — C679 Malignant neoplasm of bladder, unspecified: Secondary | ICD-10-CM | POA: Diagnosis not present

## 2023-09-20 DIAGNOSIS — N179 Acute kidney failure, unspecified: Secondary | ICD-10-CM | POA: Diagnosis not present

## 2023-09-20 DIAGNOSIS — E785 Hyperlipidemia, unspecified: Secondary | ICD-10-CM | POA: Diagnosis not present

## 2023-09-20 DIAGNOSIS — I129 Hypertensive chronic kidney disease with stage 1 through stage 4 chronic kidney disease, or unspecified chronic kidney disease: Secondary | ICD-10-CM | POA: Diagnosis not present

## 2023-09-20 DIAGNOSIS — E669 Obesity, unspecified: Secondary | ICD-10-CM | POA: Diagnosis not present

## 2023-09-22 ENCOUNTER — Inpatient Hospital Stay

## 2023-09-22 ENCOUNTER — Other Ambulatory Visit: Payer: Self-pay | Admitting: Hematology and Oncology

## 2023-09-22 ENCOUNTER — Inpatient Hospital Stay (HOSPITAL_BASED_OUTPATIENT_CLINIC_OR_DEPARTMENT_OTHER): Admitting: Hematology and Oncology

## 2023-09-22 VITALS — BP 161/88 | HR 63 | Temp 97.6°F | Resp 15 | Wt 251.5 lb

## 2023-09-22 DIAGNOSIS — C679 Malignant neoplasm of bladder, unspecified: Secondary | ICD-10-CM

## 2023-09-22 DIAGNOSIS — C671 Malignant neoplasm of dome of bladder: Secondary | ICD-10-CM | POA: Diagnosis not present

## 2023-09-22 DIAGNOSIS — R7989 Other specified abnormal findings of blood chemistry: Secondary | ICD-10-CM | POA: Diagnosis not present

## 2023-09-22 DIAGNOSIS — Z95828 Presence of other vascular implants and grafts: Secondary | ICD-10-CM

## 2023-09-22 LAB — CBC WITH DIFFERENTIAL (CANCER CENTER ONLY)
Abs Immature Granulocytes: 0.05 10*3/uL (ref 0.00–0.07)
Basophils Absolute: 0 10*3/uL (ref 0.0–0.1)
Basophils Relative: 1 %
Eosinophils Absolute: 0.3 10*3/uL (ref 0.0–0.5)
Eosinophils Relative: 6 %
HCT: 25.8 % — ABNORMAL LOW (ref 39.0–52.0)
Hemoglobin: 8.5 g/dL — ABNORMAL LOW (ref 13.0–17.0)
Immature Granulocytes: 1 %
Lymphocytes Relative: 12 %
Lymphs Abs: 0.6 10*3/uL — ABNORMAL LOW (ref 0.7–4.0)
MCH: 29.7 pg (ref 26.0–34.0)
MCHC: 32.9 g/dL (ref 30.0–36.0)
MCV: 90.2 fL (ref 80.0–100.0)
Monocytes Absolute: 0.5 10*3/uL (ref 0.1–1.0)
Monocytes Relative: 9 %
Neutro Abs: 3.9 10*3/uL (ref 1.7–7.7)
Neutrophils Relative %: 71 %
Platelet Count: 168 10*3/uL (ref 150–400)
RBC: 2.86 MIL/uL — ABNORMAL LOW (ref 4.22–5.81)
RDW: 15.3 % (ref 11.5–15.5)
WBC Count: 5.4 10*3/uL (ref 4.0–10.5)
nRBC: 0 % (ref 0.0–0.2)

## 2023-09-22 LAB — CMP (CANCER CENTER ONLY)
ALT: 11 U/L (ref 0–44)
AST: 11 U/L — ABNORMAL LOW (ref 15–41)
Albumin: 3.6 g/dL (ref 3.5–5.0)
Alkaline Phosphatase: 103 U/L (ref 38–126)
Anion gap: 5 (ref 5–15)
BUN: 23 mg/dL (ref 8–23)
CO2: 29 mmol/L (ref 22–32)
Calcium: 8.7 mg/dL — ABNORMAL LOW (ref 8.9–10.3)
Chloride: 103 mmol/L (ref 98–111)
Creatinine: 1.95 mg/dL — ABNORMAL HIGH (ref 0.61–1.24)
GFR, Estimated: 37 mL/min — ABNORMAL LOW (ref >60)
Glucose, Bld: 119 mg/dL — ABNORMAL HIGH (ref 70–99)
Potassium: 4.1 mmol/L (ref 3.5–5.1)
Sodium: 137 mmol/L (ref 135–145)
Total Bilirubin: 0.5 mg/dL (ref 0.0–1.2)
Total Protein: 7.5 g/dL (ref 6.5–8.1)

## 2023-09-22 MED ORDER — HEPARIN SOD (PORK) LOCK FLUSH 100 UNIT/ML IV SOLN
500.0000 [IU] | Freq: Once | INTRAVENOUS | Status: AC
  Administered 2023-09-22: 500 [IU]

## 2023-09-22 MED ORDER — SODIUM CHLORIDE 0.9% FLUSH
10.0000 mL | Freq: Once | INTRAVENOUS | Status: AC
  Administered 2023-09-22: 10 mL

## 2023-09-22 NOTE — Progress Notes (Signed)
 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 Rosaline Coma  08/12/2022: Cycle 1 of Pembrolizumab  09/02/2022: Cycle 2 of Pembrolizumab   09/23/2022: Cycle 3 of Pembrolizumab  10/14/2022: Cycle 4 of Pembrolizumab  11/04/2022: Cycle 5 of Pembrolizumab  11/25/2022: Cycle 6 of Pembrolizumab  12/20/2022: Cycle 7 of Pembrolizumab  01/13/2023: Cycle 8 of Pembrolizumab   03/03/2023: Cystectomy/Prostatectomy showed infiltrative high-grade urothelial carcinoma, size 5.8 cm involving bladder dome, anterior wall and prostatic stroma. Tumor invades directly into prostatic stroma at apex and mid portion of the gland (pT4a). Metastatic urothelial cancer in one of two left common iliac lymph nodes.  06/19/2023-07/27/2023: Received adjuvant radiation to surgical bed.  He received 50.4 Gray in 28 fractions.  CHIEF COMPLAINTS/PURPOSE OF CONSULTATION:  "High Risk Non-Muscle Invasive Bladder Cancer "  HISTORY OF PRESENTING ILLNESS:  Luis [name] 68 y.o. 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  He was  last seen on 08/09/2023.   He underwent bilateral nephrostomy placement as well and recently had an exchange on 08/07/2023.  Additionally, he completed adjuvant radiation on 07/27/2023.  On exam today Luis [name] reports he has been steady overall in the interim since her last visit.  He reports that he does still have some blood in his nephrostomy tubes on occasion.  He has met with nephrology as well as urology.  He notes urology is holding on any further procedures until we proceed with chemotherapy.  He reports that he does not have any pain anywhere, though he does have some occasional pain in the tubes.  He reports that he has been getting exchanges as a scheduled.  He has has had no fevers, chills, sweats, nausea, vomiting or diarrhea.  He is doing his best to try to stay hydrated.  The bulk for discussion focused on next dose moving forward.  We discussed that with his poor kidney function anemia he remains a poor candidate for chemotherapy treatment.  He voices understanding.  I noted that we would discuss with the other teams to determine what the best path forward would be for this patient's care.  MEDICAL HISTORY:  Past Medical History:  Diagnosis Date   Anxiety    takes Valium  as needed   Aortic stenosis, moderate    Arthritis    Ascending aortic aneurysm (HCC)    CAD (coronary artery disease)    a. s/p PCI of the RCA 8/12 with DES by Dr Arlester Ladd, preserved EF. b. LHC/RHC (2/16) with mean RA 12, PA 32/15, mean PCWP 18, CI 3.47; patent mid and distal RCA stents, 50-60% proximal stenosis small PDA.      Cancer [facility])    bladder   Carotid stenosis    a. Carotid US  (05/2013):  Bilateral 1-39% ICA; L  thyroid  nodule (prior hx of aspiration).   Chronic diastolic CHF (congestive heart failure) (HCC)    Complication of anesthesia    difficulty waking up after gallbladder surgery   Depression    Dyspnea    Essential hypertension    GERD (gastroesophageal reflux disease)    if needed will take  OTC meds    Heart murmur    History of colonic polyps    hyperplastic   Hyperlipidemia    Joint pain    Lesion of bladder    Myocardial infarction (HCC) 2012   Obesity (BMI 30-39.9) 02/29/2016   Pre-diabetes    Restless leg    Sleep apnea    uses cpap   Tubular adenoma of colon    Vertigo    takes Meclizine  as needed    SURGICAL HISTORY: Past Surgical History:  Procedure Laterality Date   AORTIC VALVE REPLACEMENT N/A 09/16/2021   Procedure: AORTIC VALVE REPLACEMENT (AVR);  Surgeon: Bartley Lightning, MD;  Location: [facility] OR;  Service: Open Heart Surgery;  Laterality: N/A;   CATARACT EXTRACTION   4 YRS AGO   BOTH EYES   CHOLECYSTECTOMY  07/21/2011   Procedure: LAPAROSCOPIC CHOLECYSTECTOMY WITH INTRAOPERATIVE CHOLANGIOGRAM;  Surgeon: Thayne Fine, MD;  Location: [facility];  Service: General;  Laterality: N/A;   CORONARY ANGIOPLASTY  2012   2 stents   coronary stenting     s/p PCI of the RCA by Dr Arlester Ladd 8/12 with 2 promus stents   CYSTOSCOPY W/ RETROGRADES Bilateral 12/13/2019   Procedure: CYSTOSCOPY WITH RETROGRADE PYELOGRAM;  Surgeon: Osborn Blaze, MD;  Location: [facility];  Service: Urology;  Laterality: Bilateral;   CYSTOSCOPY W/ RETROGRADES Bilateral 10/14/2020   Procedure: CYSTOSCOPY WITH RETROGRADE PYELOGRAM;  Surgeon: Osborn Blaze, MD;  [facility];  Service: Urology;  Laterality: Bilateral;   CYSTOSCOPY W/ RETROGRADES Bilateral 12/16/2020   Procedure: CYSTOSCOPY WITH RETROGRADE PYELOGRAM;  Surgeon: Osborn Blaze, MD;  Location: [facility];  Service: Urology;  Laterality: Bilateral;   CYSTOSCOPY W/ RETROGRADES Bilateral 06/03/2022   Procedure: CYSTOSCOPY WITH RETROGRADE PYELOGRAM;  Surgeon: Osborn Blaze, MD;  Location: [facility];  Service: Urology;  Laterality: Bilateral;   CYSTOSCOPY W/ RETROGRADES Bilateral 12/28/2022   Procedure: CYSTOSCOPY WITH RETROGRADE PYELOGRAM, FULGARATION OF BLEEDERS;  Surgeon: Melody Spurling., MD;  Location: [facility];  Service: Urology;  Laterality: Bilateral;   CYSTOSCOPY WITH INJECTION N/A 03/03/2023   Procedure: CYSTOSCOPY WITH INDOCYANINE INJECTION;  Surgeon: Melody Spurling., MD;  Location: [facility];  Service: Urology;  Laterality: N/A;  360 MINUTES   ESOPHAGOGASTRODUODENOSCOPY N/A 06/28/2023   Procedure: EGD (ESOPHAGOGASTRODUODENOSCOPY);  Surgeon: Lajuan Pila, MD;  Location: [facility];  Service: Gastroenterology;  Laterality: N/A;   IR IMAGING GUIDED PORT INSERTION  05/29/2023   IR NEPHROSTOMY EXCHANGE LEFT  08/07/2023   IR NEPHROSTOMY PLACEMENT RIGHT  07/01/2023   IR RADIOLOGY PERIPHERAL GUIDED IV START  07/01/2023   IR US  GUIDE VASC ACCESS LEFT  07/01/2023   LEFT AND RIGHT HEART CATHETERIZATION WITH CORONARY ANGIOGRAM N/A 06/23/2014   Procedure: LEFT AND RIGHT HEART CATHETERIZATION WITH CORONARY ANGIOGRAM;  Surgeon: Darlis Eisenmenger, MD;  Location: [facility] CATH LAB;  Service: Cardiovascular;  Laterality: N/A;   NECK SURGERY  03/23/09   per Dr. Murrel Arnt, cervical fusion    PERICARDIOCENTESIS N/A 09/28/2021   Procedure: PERICARDIOCENTESIS;  Surgeon: Lucendia Rusk, MD;  Location: [facility] INVASIVE CV LAB;  Service: Cardiovascular;  Laterality: N/A;   REPLACEMENT ASCENDING  AORTA N/A 09/16/2021   Procedure: REPLACEMENT ASCENDING AORTA WITH 30 X HEMASHIELD PLATINUM WOVEN DOUBLE VELOUR VASCULAR GRAFT;  Surgeon: Bartley Lightning, MD;  Location: [facility] OR;  Service: Open Heart Surgery;  Laterality: N/A;  CIRC ARREST   right elbow surgery     RIGHT HEART CATH N/A 06/15/2023   Procedure: RIGHT HEART CATH;  Surgeon: Darlis Eisenmenger, MD;  Location: [facility] INVASIVE CV LAB;  Service: Cardiovascular;  Laterality: N/A;   RIGHT HEART CATH AND CORONARY ANGIOGRAPHY N/A 07/08/2021   Procedure: RIGHT HEART CATH AND CORONARY ANGIOGRAPHY;  Surgeon: Darlis Eisenmenger, MD;  Location: [facility] INVASIVE CV LAB;  Service: Cardiovascular;  Laterality: N/A;   ROBOT ASSISTED LAPAROSCOPIC COMPLETE CYSTECT ILEAL CONDUIT N/A  03/03/2023   Procedure: XI ROBOTIC ASSISTED LAPAROSCOPIC COMPLETE CYSTECTECTOMY WITH  ILEAL CONDUIT DIVERSION;  Surgeon: Melody Spurling., MD;  Location: [facility];  Service: Urology;  Laterality: N/A;   ROBOT ASSISTED LAPAROSCOPIC RADICAL PROSTATECTOMY N/A 03/03/2023   Procedure: XI ROBOTIC ASSISTED LAPAROSCOPIC RADICAL PROSTATECTOMY WITH LYMPH NODE DISSECTION;  Surgeon: Melody Spurling., MD;  Location: [facility];  Service: Urology;  Laterality: N/A;   solonscopy  05/23/08   per Dr. Baruch Bosch hemorrhoids only, repeat in 5 years   SUBXYPHOID PERICARDIAL WINDOW N/A 09/28/2021   Procedure: SUBXYPHOID PERICARDIAL WINDOW;  Surgeon: Bartley Lightning, MD;  Location: [facility] OR;  Service: Thoracic;  Laterality: N/A;   TEE WITHOUT CARDIOVERSION N/A 06/23/2014   Procedure: TRANSESOPHAGEAL ECHOCARDIOGRAM (TEE);  Surgeon: Darlis Eisenmenger, MD;  Location: [facility] ENDOSCOPY;  Service: Cardiovascular;  Laterality: N/A;   TEE WITHOUT CARDIOVERSION N/A 01/21/2016   Procedure: TRANSESOPHAGEAL ECHOCARDIOGRAM (TEE);  Surgeon: Darlis Eisenmenger, MD;  Location: [facility] ENDOSCOPY;  Service: Cardiovascular;  Laterality: N/A;   TEE WITHOUT CARDIOVERSION N/A 09/16/2021   Procedure: TRANSESOPHAGEAL ECHOCARDIOGRAM (TEE);  Surgeon: Bartley Lightning, MD;  Location: [facility] OR;  Service: Open Heart Surgery;  Laterality: N/A;   TEE WITHOUT CARDIOVERSION N/A 09/28/2021   Procedure: TRANSESOPHAGEAL ECHOCARDIOGRAM (TEE);  Surgeon: Bartley Lightning, MD;  Location: [facility] OR;  Service: Thoracic;  Laterality: N/A;   TONSILLECTOMY     TRANSESOPHAGEAL ECHOCARDIOGRAM (CATH LAB) N/A 04/17/2023   Procedure: TRANSESOPHAGEAL ECHOCARDIOGRAM;  Surgeon: Hugh Madura, MD;  Location: [facility] INVASIVE CV LAB;  Service: Cardiovascular;  Laterality: N/A;   TRANSURETHRAL RESECTION OF BLADDER TUMOR N/A 10/21/2019   Procedure: TRANSURETHRAL RESECTION OF BLADDER TUMOR (TURBT);  Surgeon: Ottelin, Mark, MD;  Location: [facility];  Service: Urology;  Laterality: N/A;    TRANSURETHRAL RESECTION OF BLADDER TUMOR N/A 12/13/2019   Procedure: TRANSURETHRAL RESECTION OF BLADDER TUMOR (TURBT);  Surgeon: Osborn Blaze, MD;  Location: [facility];  Service: Urology;  Laterality: N/A;  1 HR   TRANSURETHRAL RESECTION OF BLADDER TUMOR N/A 10/14/2020   Procedure: TRANSURETHRAL RESECTION OF BLADDER TUMOR (TURBT);  Surgeon: Osborn Blaze, MD;  Location: [facility];  Service: Urology;  Laterality: N/A;   TRANSURETHRAL RESECTION OF BLADDER TUMOR N/A 12/16/2020   Procedure: RESTAGING TRANSURETHRAL RESECTION OF BLADDER TUMOR (TURBT);  Surgeon: Osborn Blaze, MD;  Location: [facility];  Service: Urology;  Laterality: N/A;   TRANSURETHRAL RESECTION OF BLADDER TUMOR N/A 06/03/2022   Procedure: TRANSURETHRAL RESECTION OF BLADDER TUMOR (TURBT);  Surgeon: Osborn Blaze, MD;  Location: [facility];  Service: Urology;  Laterality: N/A;   UMBILICAL HERNIA REPAIR  03/03/2023   Procedure: HERNIA REPAIR UMBILICAL;  Surgeon: Melody Spurling., MD;  Location: [facility];  Service: Urology;;    SOCIAL HISTORY: Social History   Socioeconomic History   Marital status: Married    Spouse name: Not on file   Number of children: 4   Years of education: Not on file   Highest education level: Not on file  Occupational History   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  Tobacco Use   Smoking status: Former    Current packs/day: 0.00    Average packs/day: 2.0 packs/day for 40.0 years (80.0 ttl pk-yrs)    Types: Cigarettes    Start date: 7    Quit date: 2012    Years since quitting: 13.4    Passive exposure: Never   Smokeless tobacco: Never  Vaping Use   Vaping status: Never Used  Substance and Sexual Activity   Alcohol use: No    Alcohol/week: 0.0 standard drinks of alcohol   Drug use: No   Sexual activity: Not on file  Other Topics Concern   Not on file  Social History Narrative   Not on file   Social  Drivers of Health   Financial Resource Strain: Low Risk  (11/03/2022)   Overall Financial Resource Strain (CARDIA)    Difficulty of Paying Living Expenses: Not hard at all  Food Insecurity: No Food Insecurity (07/04/2023)   Hunger Vital Sign    Worried About Running Out of Food in the Last Year: Never true    Ran Out of Food in the Last Year: Never true  Transportation Needs: No Transportation Needs (07/04/2023)   PRAPARE - Administrator, Civil Service (Medical): No    Lack of Transportation (Non-Medical): No  Physical Activity: Unknown (11/03/2022)   Exercise Vital Sign    Days of Exercise per Week: Patient unable to answer    Minutes of Exercise per Session: 30 min  Stress: No Stress Concern Present (11/03/2022)   [employer] of Occupational Health - Occupational Stress Questionnaire    Feeling of Stress : Not at all  Social Connections: Moderately Isolated (06/27/2023)   Social Connection and Isolation Panel [NHANES]    Frequency of Communication with Friends and Family: More than three times a week    Frequency of Social Gatherings with Friends and Family: More than three times a week    Attends Religious Services: Never    Database administrator or Organizations: No    Attends Banker Meetings: Never    Marital Status: Married  Catering manager Violence: Not At Risk (07/04/2023)   Humiliation, Afraid, Rape, and Kick questionnaire    Fear of Current or Ex-Partner: No    Emotionally Abused: No    Physically Abused: No    Sexually Abused: No    FAMILY HISTORY: Family History  Problem Relation Age of Onset   Lung cancer Mother        lung   Esophageal cancer Cousin    Colon cancer Neg Hx    Rectal cancer Neg Hx    Stomach cancer Neg Hx     ALLERGIES:  has no known allergies.  MEDICATIONS:  Current Outpatient Medications  Medication Sig Dispense Refill   ferrous sulfate  325 (65 FE) MG tablet Take 1 tablet (325 mg total) by mouth daily with  breakfast. Please take with a source of Vitamin C 90 tablet 3   acetaminophen  (TYLENOL ) 325 MG tablet Take 650 mg by mouth every 6 (six) hours as needed (for pain).     amLODipine  (NORVASC )  5 MG tablet TAKE 1 TABLET (5 MG TOTAL) BY MOUTH DAILY. 90 tablet 1   amoxicillin -clavulanate (AUGMENTIN ) 875-125 MG tablet Take 1 tablet by mouth every 12 (twelve) hours. 20 tablet 0   aspirin  EC 81 MG tablet Take 81 mg by mouth in the morning.     carvedilol  (COREG ) 3.125 MG tablet TAKE 1 TABLET BY MOUTH TWICE A DAY WITH FOOD 180 tablet 1   cyanocobalamin  (VITAMIN B12) 1000 MCG tablet Take 1 tablet (1,000 mcg total) by mouth daily. 30 tablet 6   diazepam  (VALIUM ) 5 MG tablet TAKE 1 TABLET BY MOUTH EVERY 12 HOURS AS NEEDED FOR ANXIETY. 60 tablet 5   famotidine  (PEPCID ) 20 MG tablet Take 1 tablet (20 mg total) by mouth 2 (two) times daily for 6 days. (Patient taking differently: Take 20 mg by mouth 2 (two) times daily. As needed) 12 tablet 0   furosemide  (LASIX ) 20 MG tablet Take 1 tablet (20 mg total) by mouth daily. 45 tablet 3   ipratropium (ATROVENT ) 0.03 % nasal spray Place 2 sprays into both nostrils every 12 (twelve) hours as needed for rhinitis.     lidocaine -prilocaine  (EMLA ) cream Apply 1 Application topically as needed. 30 g 0   Magnesium  500 MG TABS Take 500 mg by mouth every morning.     meclizine  (ANTIVERT ) 25 MG tablet Take 1 tablet (25 mg total) by mouth 3 (three) times daily as needed for dizziness. 30 tablet 0   multivitamin-iron-minerals-folic acid  (CENTRUM) chewable tablet Chew 1 tablet by mouth daily.     nitroGLYCERIN  (NITROSTAT ) 0.4 MG SL tablet Place 0.4 mg under the tongue every 5 (five) minutes as needed for chest pain.     ondansetron  (ZOFRAN ) 8 MG tablet Take 1 tablet (8 mg total) by mouth every 8 (eight) hours as needed. 30 tablet 0   pantoprazole  (PROTONIX ) 40 MG tablet Take 1 tablet (40 mg total) by mouth 2 (two) times daily before a meal. 60 tablet 5   potassium chloride  SA  (KLOR-CON  M20) 20 MEQ tablet Take 1 tablet (20 mEq total) by mouth daily. (Patient taking differently: Take 20 mEq by mouth 2 (two) times daily.)     prochlorperazine  (COMPAZINE ) 10 MG tablet Take 1 tablet (10 mg total) by mouth every 6 (six) hours as needed for nausea or vomiting. 30 tablet 0   rosuvastatin  (CRESTOR ) 20 MG tablet Take 1 tablet (20 mg total) by mouth daily. 90 tablet 3   traMADol  (ULTRAM ) 50 MG tablet Take 1 tablet (50 mg total) by mouth every 6 (six) hours as needed. 30 tablet 0   venlafaxine  XR (EFFEXOR -XR) 150 MG 24 hr capsule TAKE 1 CAPSULE BY MOUTH DAILY WITH BREAKFAST. 90 capsule 0   No current facility-administered medications for this visit.    REVIEW OF SYSTEMS:   Constitutional: ( - ) fevers, ( - )  chills , ( - ) night sweats Eyes: ( - ) blurriness of vision, ( - ) double vision, ( - ) watery eyes Ears, nose, mouth, throat, and face: ( - ) mucositis, ( - ) sore throat Respiratory: ( - ) cough, ( - ) dyspnea, ( - ) wheezes Cardiovascular: ( - ) palpitation, ( - ) chest discomfort, ( - ) lower extremity swelling Gastrointestinal:  ( - ) nausea, ( - ) heartburn, ( - ) change in bowel habits Skin: ( - ) abnormal skin rashes Lymphatics: ( - ) new lymphadenopathy, ( - ) easy bruising Neurological: ( - ) numbness, ( - )  tingling, ( - ) new weaknesses Behavioral/Psych: ( - ) mood change, ( - ) new changes  All other systems were reviewed with the patient and are negative.  PHYSICAL EXAMINATION: ECOG PERFORMANCE STATUS: 1 - Symptomatic but completely ambulatory  Vitals:   09/22/23 1148  BP: (!) 161/88  Pulse: 63  Resp: 15  Temp: 97.6 F (36.4 C)  SpO2: 100%    Filed Weights   09/22/23 1148  Weight: 251 lb 8 oz (114.1 kg)      GENERAL: well appearing elderly Caucasian male in NAD  SKIN: skin color, texture, turgor are normal, no rashes or significant lesions EYES: conjunctiva are pink and non-injected, sclera clear LUNGS: clear to auscultation and  percussion with normal breathing effort HEART: regular rate & rhythm and no murmurs and no lower extremity edema Musculoskeletal: no cyanosis of digits and no clubbing.  PSYCH: alert & oriented x 3, fluent speech NEURO: no focal motor/sensory deficits  LABORATORY DATA:  I have reviewed the data as listed    Latest Ref Rng & Units 09/23/2023    2:44 PM 09/22/2023   11:06 AM 09/06/2023   12:03 PM  CBC  WBC 4.0 - 10.5 K/uL 7.8  5.4  10.7   Hemoglobin 13.0 - 17.0 g/dL 8.3  8.5  9.5   Hematocrit 39.0 - 52.0 % 26.2  25.8  29.5   Platelets 150 - 400 K/uL 178  168  136        Latest Ref Rng & Units 09/23/2023    2:44 PM 09/22/2023   11:06 AM 09/06/2023   12:03 PM  CMP  Glucose 70 - 99 mg/dL 161  096  045   BUN 8 - 23 mg/dL 25  23  35   Creatinine 0.61 - 1.24 mg/dL 4.09  8.11  9.14   Sodium 135 - 145 mmol/L 139  137  136   Potassium 3.5 - 5.1 mmol/L 4.1  4.1  4.3   Chloride 98 - 111 mmol/L 105  103  101   CO2 22 - 32 mmol/L 25  29  28    Calcium  8.9 - 10.3 mg/dL 8.5  8.7  8.6   Total Protein 6.5 - 8.1 g/dL  7.5  7.4   Total Bilirubin 0.0 - 1.2 mg/dL  0.5  0.8   Alkaline Phos 38 - 126 U/L  103  98   AST 15 - 41 U/L  11  21   ALT 0 - 44 U/L  11  36     RADIOGRAPHIC STUDIES: CT Renal Stone Study Result Date: 09/23/2023 CLINICAL DATA:  Right-sided flank pain. Hematuria. Percutaneous nephrostomies. Previous cystectomy for bladder carcinoma. * Tracking Code: BO * EXAM: CT ABDOMEN AND PELVIS WITHOUT CONTRAST TECHNIQUE: Multidetector CT imaging of the abdomen and pelvis was performed following the standard protocol without IV contrast. RADIATION DOSE REDUCTION: This exam was performed according to the departmental dose-optimization program which includes automated exposure control, adjustment of the mA and/or kV according to patient size and/or use of iterative reconstruction technique. COMPARISON:  06/27/2023 FINDINGS: Lower chest: No acute findings. Hepatobiliary: No mass visualized on this  unenhanced exam. Prior cholecystectomy. No evidence of biliary obstruction. Pancreas: No mass or inflammatory process visualized on this unenhanced exam. Spleen:  Within normal limits in size. Adrenals/Urinary tract: Bilateral percutaneous nephrostomy tubes now seen in place. Persistent mild right pelvicaliectasis is seen, with high attenuation material now seen within the renal collecting system, consistent with blood clot. Right perinephric soft tissue stranding  is seen, without No evidence of perinephric hematoma. No ureteral calculi identified. Prior cystoprostatectomy with ileal conduit again noted. Stomach/Bowel: No evidence of obstruction, inflammatory process, or abnormal fluid collections. Vascular/Lymphatic: Increased retroperitoneal lymphadenopathy seen in left paraaortic region, now measuring 2.4 cm on image 36/2 compared to 1.6 cm previously. No new sites of lymphadenopathy identified. No evidence of abdominal aortic aneurysm. Reproductive:  Prior cystoprostatectomy. Other:  None. Musculoskeletal:  No suspicious bone lesions identified. IMPRESSION: Bilateral percutaneous nephrostomy tubes in place. Persistent mild right pelvicaliectasis, with high attenuation blood clot now seen within the renal collecting system. No evidence of urolithiasis or hydronephrosis. Prior cystoprostatectomy with ileal conduit. Increased mild left paraaortic retroperitoneal lymphadenopathy, consistent with metastatic disease. Electronically Signed   By: Marlyce Sine M.D.   On: 09/23/2023 15:27     ASSESSMENT & PLAN Luis [name] 68 y.o. 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  After review of the labs, review of the records, and discussion with the patient the patients findings are most consistent with BCG unresponsive high risk non-muscle invasive bladder cancer, not a candidate for cystectomy.  # BCG-Unresponsive High  Risk Non-Muscle Invasive Bladder Cancer --patient is not felt to be a candidate for cystectomy -- Recommended pembrolizumab  200 mg q. 21 days until progression or intolerance up to 24 months. --Received 8 cycles of Pembrolizumab  on 08/12/2022-01/13/2023: Cycle 8 of Pembrolizumab   --On 03/03/2023, underwent Cystectomy/Prostatectomy showed infiltrative high-grade urothelial carcinoma, size 5.8 cm involving bladder dome, anterior wall and prostatic stroma. Tumor invades directly into prostatic stroma at apex and mid portion of the gland (pT4a). Metastatic urothelial cancer in one of two left common iliac lymph nodes.  -- Given the results of the pathology showing positive margins and spread to the lymph node we are considering radiation therapy versus chemotherapy.  For chemotherapy, recommend gemcitabine  and cisplatin x 4 cycles.  This would consist of gemcitabine  1000 mg/m on days 1 and 8 and cisplatin 70 mg/m on day 1 of 21-day cycle for 4 cycles. -- Due to kidney dysfunction and anemia, chemotherapy was deferred and patient underwent adjuvant radiation to the surgical bed from 06/19/2023 until 07/27/2023.  He received 50.4 Gray in 28 fractions. PLAN:  --Labs from today show white blood cell 7.8, hemoglobin 8.3, MCV 94.2, platelets 178. Cr 1.99 with GFR 36.  -- Due to persistent anemia and elevated Cr will need to continue to hold chemotherapy  --Plan is to proceed with cisplatin and gemcitabine  q. 21 days x 4 cycles once patient's blood counts and kidney function have improved enough.  Plan is currently on hold. --RTC in 2 weeks with labs and follow up in 4 weeks for clinic visit   # Skin Rash -- Occurred after receiving blood transfusion, etiology not entirely clear. -- Patient has been applying moisturizer to the skin and has been taking Benadryl . -- Skin is persistently dry and itchy and erythematous. -- Prescribe another steroid taper with 60 mg for 5 days, 40 for 5 days, and 20 for 5  days.  #Anemia --Hemoglobin is 8.3 today. --May be multifactorial due to nutritional deficiency as well as kidney dysfunction. -- Nutritional labs checked today show borderline vitamin B12 deficiency and iron deficiency. -- Patient is currently taking vitamin B12 p.o. daily and ferrous sulfate  every other day. -- today will receive IV monoferric  and B12 IM injections to bolster levels.  #Hypokalemia: --Potassium level is 4.1 today --Patient is currently taking potassium  supplement. Advised to continue  #Flank pain: --Secondary to bilateral nephrostomy. Patient is currently taking tylenol  with minimal improvement.  --Sent prescription of tramadol  50 mg PO q 6 hours as needed.   # AKI -- Currently has bilateral nephrostomies, underwent exchange on 08/07/2023. -- Creatinine 1.96  #Supportive Care -- chemotherapy education complete -- port placed -- zofran  8mg  q8H PRN and compazine  10mg  PO q6H for nausea -- EMLA  cream for port -- no pain medication required at this time.    No orders of the defined types were placed in this encounter.  All questions were answered. The patient knows to call the clinic with any problems, questions or concerns.  I have spent a total of 30 minutes minutes of face-to-face and non-face-to-face time, preparing to see the patient, performing a medically appropriate examination, counseling and educating the patient, documenting clinical information in the electronic health record,and care coordination.   Rogerio Clay, MD Department of Hematology/Oncology [facility] at [facility] Phone: [phone] Pager: [phone] Email: [email or web address]

## 2023-09-23 ENCOUNTER — Emergency Department (HOSPITAL_COMMUNITY)
Admission: EM | Admit: 2023-09-23 | Discharge: 2023-09-23 | Disposition: A | Discharge status: Home / Self Care | Attending: Emergency Medicine | Admitting: Emergency Medicine

## 2023-09-23 ENCOUNTER — Encounter: Payer: Self-pay | Admitting: Hematology and Oncology

## 2023-09-23 ENCOUNTER — Other Ambulatory Visit: Payer: Self-pay

## 2023-09-23 ENCOUNTER — Emergency Department (HOSPITAL_COMMUNITY)

## 2023-09-23 ENCOUNTER — Encounter (HOSPITAL_COMMUNITY): Payer: Self-pay

## 2023-09-23 DIAGNOSIS — R310 Gross hematuria: Secondary | ICD-10-CM | POA: Insufficient documentation

## 2023-09-23 DIAGNOSIS — T83032A Leakage of nephrostomy catheter, initial encounter: Secondary | ICD-10-CM | POA: Insufficient documentation

## 2023-09-23 DIAGNOSIS — C679 Malignant neoplasm of bladder, unspecified: Secondary | ICD-10-CM | POA: Diagnosis not present

## 2023-09-23 DIAGNOSIS — T83092A Other mechanical complication of nephrostomy catheter, initial encounter: Secondary | ICD-10-CM | POA: Diagnosis not present

## 2023-09-23 DIAGNOSIS — R109 Unspecified abdominal pain: Secondary | ICD-10-CM | POA: Diagnosis not present

## 2023-09-23 DIAGNOSIS — N189 Chronic kidney disease, unspecified: Secondary | ICD-10-CM | POA: Diagnosis not present

## 2023-09-23 DIAGNOSIS — T8383XA Hemorrhage of genitourinary prosthetic devices, implants and grafts, initial encounter: Secondary | ICD-10-CM

## 2023-09-23 DIAGNOSIS — R590 Localized enlarged lymph nodes: Secondary | ICD-10-CM | POA: Diagnosis not present

## 2023-09-23 DIAGNOSIS — Z7982 Long term (current) use of aspirin: Secondary | ICD-10-CM | POA: Insufficient documentation

## 2023-09-23 DIAGNOSIS — Y828 Other medical devices associated with adverse incidents: Secondary | ICD-10-CM | POA: Insufficient documentation

## 2023-09-23 DIAGNOSIS — R319 Hematuria, unspecified: Secondary | ICD-10-CM | POA: Diagnosis not present

## 2023-09-23 LAB — CBC WITH DIFFERENTIAL/PLATELET
Abs Immature Granulocytes: 0.07 10*3/uL (ref 0.00–0.07)
Basophils Absolute: 0 10*3/uL (ref 0.0–0.1)
Basophils Relative: 0 %
Eosinophils Absolute: 0.3 10*3/uL (ref 0.0–0.5)
Eosinophils Relative: 4 %
HCT: 26.2 % — ABNORMAL LOW (ref 39.0–52.0)
Hemoglobin: 8.3 g/dL — ABNORMAL LOW (ref 13.0–17.0)
Immature Granulocytes: 1 %
Lymphocytes Relative: 7 %
Lymphs Abs: 0.5 10*3/uL — ABNORMAL LOW (ref 0.7–4.0)
MCH: 29.9 pg (ref 26.0–34.0)
MCHC: 31.7 g/dL (ref 30.0–36.0)
MCV: 94.2 fL (ref 80.0–100.0)
Monocytes Absolute: 0.7 10*3/uL (ref 0.1–1.0)
Monocytes Relative: 9 %
Neutro Abs: 6.2 10*3/uL (ref 1.7–7.7)
Neutrophils Relative %: 79 %
Platelets: 178 10*3/uL (ref 150–400)
RBC: 2.78 MIL/uL — ABNORMAL LOW (ref 4.22–5.81)
RDW: 15.5 % (ref 11.5–15.5)
WBC: 7.8 10*3/uL (ref 4.0–10.5)
nRBC: 0 % (ref 0.0–0.2)

## 2023-09-23 LAB — BASIC METABOLIC PANEL WITH GFR
Anion gap: 9 (ref 5–15)
BUN: 25 mg/dL — ABNORMAL HIGH (ref 8–23)
CO2: 25 mmol/L (ref 22–32)
Calcium: 8.5 mg/dL — ABNORMAL LOW (ref 8.9–10.3)
Chloride: 105 mmol/L (ref 98–111)
Creatinine, Ser: 1.99 mg/dL — ABNORMAL HIGH (ref 0.61–1.24)
GFR, Estimated: 36 mL/min — ABNORMAL LOW (ref >60)
Glucose, Bld: 126 mg/dL — ABNORMAL HIGH (ref 70–99)
Potassium: 4.1 mmol/L (ref 3.5–5.1)
Sodium: 139 mmol/L (ref 135–145)

## 2023-09-23 MED ORDER — AMOXICILLIN-POT CLAVULANATE 875-125 MG PO TABS
1.0000 | ORAL_TABLET | Freq: Two times a day (BID) | ORAL | 0 refills | Status: DC
Start: 2023-09-23 — End: 2023-11-02

## 2023-09-23 MED ORDER — HEPARIN SOD (PORK) LOCK FLUSH 100 UNIT/ML IV SOLN
500.0000 [IU] | Freq: Once | INTRAVENOUS | Status: AC
  Administered 2023-09-23: 500 [IU]
  Filled 2023-09-23: qty 5

## 2023-09-23 MED ORDER — FERROUS SULFATE 325 (65 FE) MG PO TABS: 325.0000 mg | ORAL_TABLET | Freq: Every day | ORAL | 3 refills | Status: AC

## 2023-09-23 NOTE — ED Triage Notes (Signed)
 Pt has 2 drains in place (kidney). His right side is draining blood and clotting since today. Wife reports a minimal amount of blood usually. Pt reports pain in his right back.

## 2023-09-23 NOTE — Consult Note (Signed)
 Urology Consult Note   Requesting Attending Physician:  Guadalupe Lee, MD Service Providing Consult: Urology  Consulting Attending: Perley Bradley, MD   Reason for Consult:  hematuria  HPI: Luis [name] is seen in consultation for reasons noted above at the request of Guadalupe Lee, MD for evaluation of hematuria and PCN malfunction.  This is a 68 y.o. male with a history of BCG-unresponsive bladder cancer s/p RC/IC November 2024 with node-positive disease s/p adjuvant radiation therapy to the resection bed. 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   His last PCN exchange was 08/07/23. He has been doing well with the nephrostomy tubes, noting intermittent hematuria that self-resolves. Last night he had a large clot in his right PCN and the bag stopped draining. He presented to the ED today due to continued lack of drainage and flank pain. Denies fevers/nausea/emesis.  In the ED Tmax is 99, he is otherwise HDS. Labs show cr at baseline of 1.9, Hgb stable at 8.3 from labs obtained yesterday in oncology clinic.  CT shows bilateral PCNs in appropriate position. There are blood products in the right renal pelvis. On exam, he has merlot urine in the right PCN and conduit, left PCN is clear yellow. I was able to easily flush and withdraw from the right PCN.   At this time patient is awaiting improvement of his GFR and anemia prior to undergoing chemotherapy. We discussed that the cause of his right renal pelvis bleeding is unknown but could represent infection, trauma from the PCN, or progression of disease. Fortunately his tubes are well-positioned and flushing well with stable labs. At this time, do not think he requires inpatient admission or urgent PCN exchange, but will coordinate with [facility] to expedite his next  outpatient exchange (currently scheduled for 10/02/23).    Past Medical History: Past Medical History:  Diagnosis Date   Anxiety    takes Valium  as needed   Aortic stenosis, moderate    Arthritis    Ascending aortic aneurysm (HCC)    CAD (coronary artery disease)    a. s/p PCI of the RCA 8/12 with DES by Dr Arlester Ladd, preserved EF. b. LHC/RHC (2/16) with mean RA 12, PA 32/15, mean PCWP 18, CI 3.47; patent mid and distal RCA stents, 50-60% proximal stenosis small PDA.      Cancer [facility])    bladder   Carotid stenosis    a. Carotid US  (05/2013):  Bilateral 1-39% ICA; L thyroid  nodule (prior hx of aspiration).   Chronic diastolic CHF (congestive heart failure) (HCC)    Complication of anesthesia    difficulty waking up after gallbladder surgery   Depression    Dyspnea    Essential hypertension    GERD (gastroesophageal reflux disease)    if needed will take OTC meds    Heart murmur    History of colonic polyps    hyperplastic   Hyperlipidemia    Joint pain    Lesion of bladder    Myocardial infarction (HCC) 2012   Obesity (BMI 30-39.9) 02/29/2016   Pre-diabetes    Restless leg    Sleep apnea    uses cpap   Tubular adenoma of colon    Vertigo    takes Meclizine  as needed    Past Surgical History:  Past Surgical History:  Procedure Laterality Date  AORTIC VALVE REPLACEMENT N/A 09/16/2021   Procedure: AORTIC VALVE REPLACEMENT (AVR);  Surgeon: Bartley Lightning, MD;  Location: [facility] OR;  Service: Open Heart Surgery;  Laterality: N/A;   CATARACT EXTRACTION   4 YRS AGO   BOTH EYES   CHOLECYSTECTOMY  07/21/2011   Procedure: LAPAROSCOPIC CHOLECYSTECTOMY WITH INTRAOPERATIVE CHOLANGIOGRAM;  Surgeon: Thayne Fine, MD;  Location: [facility] ORS;  Service: General;  Laterality: N/A;   CORONARY ANGIOPLASTY  2012   2 stents   coronary stenting     s/p PCI of the RCA by Dr Arlester Ladd 8/12 with 2 promus stents   CYSTOSCOPY W/ RETROGRADES Bilateral 12/13/2019   Procedure: CYSTOSCOPY WITH RETROGRADE  PYELOGRAM;  Surgeon: Osborn Blaze, MD;  Location: [facility];  Service: Urology;  Laterality: Bilateral;   CYSTOSCOPY W/ RETROGRADES Bilateral 10/14/2020   Procedure: CYSTOSCOPY WITH RETROGRADE PYELOGRAM;  Surgeon: Osborn Blaze, MD;  Location: [facility];  Service: Urology;  Laterality: Bilateral;   CYSTOSCOPY W/ RETROGRADES Bilateral 12/16/2020   Procedure: CYSTOSCOPY WITH RETROGRADE PYELOGRAM;  Surgeon: Osborn Blaze, MD;  Location: [facility];  Service: Urology;  Laterality: Bilateral;   CYSTOSCOPY W/ RETROGRADES Bilateral 06/03/2022   Procedure: CYSTOSCOPY WITH RETROGRADE PYELOGRAM;  Surgeon: Osborn Blaze, MD;  Location: [facility];  Service: Urology;  Laterality: Bilateral;   CYSTOSCOPY W/ RETROGRADES Bilateral 12/28/2022   Procedure: CYSTOSCOPY WITH RETROGRADE PYELOGRAM, FULGARATION OF BLEEDERS;  Surgeon: Melody Spurling., MD;  Location: [facility];  Service: Urology;  Laterality: Bilateral;   CYSTOSCOPY WITH INJECTION N/A 03/03/2023   Procedure: CYSTOSCOPY WITH INDOCYANINE INJECTION;  Surgeon: Melody Spurling., MD;  Location: [facility];  Service: Urology;  Laterality: N/A;  360 MINUTES   ESOPHAGOGASTRODUODENOSCOPY N/A 06/28/2023   Procedure: EGD (ESOPHAGOGASTRODUODENOSCOPY);  Surgeon: Lajuan Pila, MD;  Location: [facility];  Service: Gastroenterology;  Laterality: N/A;   IR IMAGING GUIDED PORT INSERTION  05/29/2023   IR NEPHROSTOMY EXCHANGE LEFT  08/07/2023   IR NEPHROSTOMY PLACEMENT RIGHT  07/01/2023   IR RADIOLOGY PERIPHERAL GUIDED IV START  07/01/2023   IR US  GUIDE VASC ACCESS LEFT  07/01/2023   LEFT AND RIGHT HEART CATHETERIZATION WITH CORONARY ANGIOGRAM N/A 06/23/2014   Procedure: LEFT AND RIGHT HEART CATHETERIZATION WITH CORONARY ANGIOGRAM;  Surgeon: Darlis Eisenmenger, MD;  Location: [facility] CATH LAB;  Service: Cardiovascular;  Laterality: N/A;   NECK SURGERY  03/23/09   per Dr. Murrel Arnt, cervical fusion    PERICARDIOCENTESIS N/A 09/28/2021    Procedure: PERICARDIOCENTESIS;  Surgeon: Lucendia Rusk, MD;  Location: [facility] INVASIVE CV LAB;  Service: Cardiovascular;  Laterality: N/A;   REPLACEMENT ASCENDING AORTA N/A 09/16/2021   Procedure: REPLACEMENT ASCENDING AORTA WITH 30 X HEMASHIELD PLATINUM WOVEN DOUBLE VELOUR VASCULAR GRAFT;  Surgeon: Bartley Lightning, MD;  Location: [facility] OR;  Service: Open Heart Surgery;  Laterality: N/A;  CIRC ARREST   right elbow surgery     RIGHT HEART CATH N/A 06/15/2023   Procedure: RIGHT HEART CATH;  Surgeon: Darlis Eisenmenger, MD;  Location: [facility] INVASIVE CV LAB;  Service: Cardiovascular;  Laterality: N/A;   RIGHT HEART CATH AND CORONARY ANGIOGRAPHY N/A 07/08/2021   Procedure: RIGHT HEART CATH AND CORONARY ANGIOGRAPHY;  Surgeon: Darlis Eisenmenger, MD;  Location: [facility] INVASIVE CV LAB;  Service: Cardiovascular;  Laterality: N/A;   ROBOT ASSISTED LAPAROSCOPIC COMPLETE CYSTECT ILEAL CONDUIT N/A 03/03/2023   Procedure: XI ROBOTIC ASSISTED LAPAROSCOPIC COMPLETE CYSTECTECTOMY WITH  ILEAL CONDUIT DIVERSION;  Surgeon: Melody Spurling., MD;  Location:  [facility];  Service: Urology;  Laterality: N/A;   ROBOT ASSISTED LAPAROSCOPIC RADICAL PROSTATECTOMY N/A 03/03/2023   Procedure: XI ROBOTIC ASSISTED LAPAROSCOPIC RADICAL PROSTATECTOMY WITH LYMPH NODE DISSECTION;  Surgeon: Melody Spurling., MD;  Location: [facility];  Service: Urology;  Laterality: N/A;   solonscopy  05/23/08   per Dr. Baruch Bosch hemorrhoids only, repeat in 5 years   SUBXYPHOID PERICARDIAL WINDOW N/A 09/28/2021   Procedure: SUBXYPHOID PERICARDIAL WINDOW;  Surgeon: Bartley Lightning, MD;  Location: [facility] OR;  Service: Thoracic;  Laterality: N/A;   TEE WITHOUT CARDIOVERSION N/A 06/23/2014   Procedure: TRANSESOPHAGEAL ECHOCARDIOGRAM (TEE);  Surgeon: Darlis Eisenmenger, MD;  Location: [facility] ENDOSCOPY;  Service: Cardiovascular;  Laterality: N/A;   TEE WITHOUT CARDIOVERSION N/A 01/21/2016   Procedure: TRANSESOPHAGEAL ECHOCARDIOGRAM (TEE);  Surgeon: Darlis Eisenmenger, MD;   Location: [facility] ENDOSCOPY;  Service: Cardiovascular;  Laterality: N/A;   TEE WITHOUT CARDIOVERSION N/A 09/16/2021   Procedure: TRANSESOPHAGEAL ECHOCARDIOGRAM (TEE);  Surgeon: Bartley Lightning, MD;  Location: [facility] OR;  Service: Open Heart Surgery;  Laterality: N/A;   TEE WITHOUT CARDIOVERSION N/A 09/28/2021   Procedure: TRANSESOPHAGEAL ECHOCARDIOGRAM (TEE);  Surgeon: Bartley Lightning, MD;  Location: [facility] OR;  Service: Thoracic;  Laterality: N/A;   TONSILLECTOMY     TRANSESOPHAGEAL ECHOCARDIOGRAM (CATH LAB) N/A 04/17/2023   Procedure: TRANSESOPHAGEAL ECHOCARDIOGRAM;  Surgeon: Hugh Madura, MD;  Location: [facility] INVASIVE CV LAB;  Service: Cardiovascular;  Laterality: N/A;   TRANSURETHRAL RESECTION OF BLADDER TUMOR N/A 10/21/2019   Procedure: TRANSURETHRAL RESECTION OF BLADDER TUMOR (TURBT);  Surgeon: Ottelin, Mark, MD;  Location: [facility];  Service: Urology;  Laterality: N/A;   TRANSURETHRAL RESECTION OF BLADDER TUMOR N/A 12/13/2019   Procedure: TRANSURETHRAL RESECTION OF BLADDER TUMOR (TURBT);  Surgeon: Osborn Blaze, MD;  Location: [facility];  Service: Urology;  Laterality: N/A;  1 HR   TRANSURETHRAL RESECTION OF BLADDER TUMOR N/A 10/14/2020   Procedure: TRANSURETHRAL RESECTION OF BLADDER TUMOR (TURBT);  Surgeon: Osborn Blaze, MD;  Location: [facility];  Service: Urology;  Laterality: N/A;   TRANSURETHRAL RESECTION OF BLADDER TUMOR N/A 12/16/2020   Procedure: RESTAGING TRANSURETHRAL RESECTION OF BLADDER TUMOR (TURBT);  Surgeon: Osborn Blaze, MD;  Location: [facility];  Service: Urology;  Laterality: N/A;   TRANSURETHRAL RESECTION OF BLADDER TUMOR N/A 06/03/2022   Procedure: TRANSURETHRAL RESECTION OF BLADDER TUMOR (TURBT);  Surgeon: Osborn Blaze, MD;  Location: [facility];  Service: Urology;  Laterality: N/A;   UMBILICAL HERNIA REPAIR  03/03/2023   Procedure: HERNIA REPAIR UMBILICAL;  Surgeon: Melody Spurling., MD;  Location: [facility];   Service: Urology;;    Medication: No current facility-administered medications for this encounter.   Current Outpatient Medications  Medication Sig Dispense Refill   acetaminophen  (TYLENOL ) 325 MG tablet Take 650 mg by mouth every 6 (six) hours as needed (for pain).     amLODipine  (NORVASC ) 5 MG tablet TAKE 1 TABLET (5 MG TOTAL) BY MOUTH DAILY. 90 tablet 1   aspirin  EC 81 MG tablet Take 81 mg by mouth in the morning.     carvedilol  (COREG ) 3.125 MG tablet TAKE 1 TABLET BY MOUTH TWICE A DAY WITH FOOD 180 tablet 1   cyanocobalamin  (VITAMIN B12) 1000 MCG tablet Take 1 tablet (1,000 mcg total) by mouth daily. 30 tablet 6   diazepam  (VALIUM ) 5 MG tablet TAKE 1 TABLET BY MOUTH EVERY 12 HOURS AS NEEDED FOR ANXIETY. 60 tablet 5   famotidine  (PEPCID ) 20 MG tablet  Take 1 tablet (20 mg total) by mouth 2 (two) times daily for 6 days. (Patient taking differently: Take 20 mg by mouth 2 (two) times daily. As needed) 12 tablet 0   furosemide  (LASIX ) 20 MG tablet Take 1 tablet (20 mg total) by mouth daily. 45 tablet 3   ipratropium (ATROVENT ) 0.03 % nasal spray Place 2 sprays into both nostrils every 12 (twelve) hours as needed for rhinitis.     lidocaine -prilocaine  (EMLA ) cream Apply 1 Application topically as needed. 30 g 0   Magnesium  500 MG TABS Take 500 mg by mouth every morning.     meclizine  (ANTIVERT ) 25 MG tablet Take 1 tablet (25 mg total) by mouth 3 (three) times daily as needed for dizziness. 30 tablet 0   multivitamin-iron-minerals-folic acid  (CENTRUM) chewable tablet Chew 1 tablet by mouth daily.     nitroGLYCERIN  (NITROSTAT ) 0.4 MG SL tablet Place 0.4 mg under the tongue every 5 (five) minutes as needed for chest pain.     ondansetron  (ZOFRAN ) 8 MG tablet Take 1 tablet (8 mg total) by mouth every 8 (eight) hours as needed. 30 tablet 0   pantoprazole  (PROTONIX ) 40 MG tablet Take 1 tablet (40 mg total) by mouth 2 (two) times daily before a meal. 60 tablet 5   potassium chloride  SA (KLOR-CON  M20)  20 MEQ tablet Take 1 tablet (20 mEq total) by mouth daily. (Patient taking differently: Take 20 mEq by mouth 2 (two) times daily.)     prochlorperazine  (COMPAZINE ) 10 MG tablet Take 1 tablet (10 mg total) by mouth every 6 (six) hours as needed for nausea or vomiting. 30 tablet 0   rosuvastatin  (CRESTOR ) 20 MG tablet Take 1 tablet (20 mg total) by mouth daily. 90 tablet 3   traMADol  (ULTRAM ) 50 MG tablet Take 1 tablet (50 mg total) by mouth every 6 (six) hours as needed. 30 tablet 0   venlafaxine  XR (EFFEXOR -XR) 150 MG 24 hr capsule TAKE 1 CAPSULE BY MOUTH DAILY WITH BREAKFAST. 90 capsule 0    Allergies: No Known Allergies  Social History: Social History   Tobacco Use   Smoking status: Former    Current packs/day: 0.00    Average packs/day: 2.0 packs/day for 40.0 years (80.0 ttl pk-yrs)    Types: Cigarettes    Start date: 54    Quit date: 2012    Years since quitting: 13.4    Passive exposure: Never   Smokeless tobacco: Never  Vaping Use   Vaping status: Never Used  Substance Use Topics   Alcohol use: No    Alcohol/week: 0.0 standard drinks of alcohol   Drug use: No    Family History Family History  Problem Relation Age of Onset   Lung cancer Mother        lung   Esophageal cancer Cousin    Colon cancer Neg Hx    Rectal cancer Neg Hx    Stomach cancer Neg Hx     Review of Systems 10 systems were reviewed and are negative except as noted specifically in the HPI.  Objective   Vital signs in last 24 hours: BP 131/65 (BP Location: Left Arm)   Pulse 72   Temp 99 F (37.2 C) (Oral)   Resp 16   SpO2 97%   Physical Exam General: NAD, A&O, resting, appropriate HEENT: [place]/AT, EOMI, MMM Pulmonary: Normal work of breathing Cardiovascular: HDS, adequate peripheral perfusion Abdomen: Soft, NTTP, nondistended. Conduit in place draining merlot-colored urine without clots.  GU: Left PCN  with clear yellow urine. Right PCN with large volume merlot urine and stringy clot.  Easily flushes with return of light red urine., No CVA tenderness Extremities: warm and well perfused Neuro: Appropriate, no focal neurological deficits  Most Recent Labs: Lab Results  Component Value Date   WBC 7.8 09/23/2023   HGB 8.3 (L) 09/23/2023   HCT 26.2 (L) 09/23/2023   PLT 178 09/23/2023    Lab Results  Component Value Date   NA 139 09/23/2023   K 4.1 09/23/2023   CL 105 09/23/2023   CO2 25 09/23/2023   BUN 25 (H) 09/23/2023   CREATININE 1.99 (H) 09/23/2023   CALCIUM  8.5 (L) 09/23/2023   MG 1.6 (L) 08/14/2023   PHOS 4.2 07/03/2023    Lab Results  Component Value Date   INR 1.2 07/01/2023   APTT 34 06/26/2023     Urine Culture: @[id number] (laburin,org,[id number])@   IMAGING: CT Renal Stone Study Result Date: 09/23/2023 CLINICAL DATA:  Right-sided flank pain. Hematuria. Percutaneous nephrostomies. Previous cystectomy for bladder carcinoma. * Tracking Code: BO * EXAM: CT ABDOMEN AND PELVIS WITHOUT CONTRAST TECHNIQUE: Multidetector CT imaging of the abdomen and pelvis was performed following the standard protocol without IV contrast. RADIATION DOSE REDUCTION: This exam was performed according to the departmental dose-optimization program which includes automated exposure control, adjustment of the mA and/or kV according to patient size and/or use of iterative reconstruction technique. COMPARISON:  06/27/2023 FINDINGS: Lower chest: No acute findings. Hepatobiliary: No mass visualized on this unenhanced exam. Prior cholecystectomy. No evidence of biliary obstruction. Pancreas: No mass or inflammatory process visualized on this unenhanced exam. Spleen:  Within normal limits in size. Adrenals/Urinary tract: Bilateral percutaneous nephrostomy tubes now seen in place. Persistent mild right pelvicaliectasis is seen, with high attenuation material now seen within the renal collecting system, consistent with blood clot. Right perinephric soft tissue stranding is seen, without No  evidence of perinephric hematoma. No ureteral calculi identified. Prior cystoprostatectomy with ileal conduit again noted. Stomach/Bowel: No evidence of obstruction, inflammatory process, or abnormal fluid collections. Vascular/Lymphatic: Increased retroperitoneal lymphadenopathy seen in left paraaortic region, now measuring 2.4 cm on image 36/2 compared to 1.6 cm previously. No new sites of lymphadenopathy identified. No evidence of abdominal aortic aneurysm. Reproductive:  Prior cystoprostatectomy. Other:  None. Musculoskeletal:  No suspicious bone lesions identified. IMPRESSION: Bilateral percutaneous nephrostomy tubes in place. Persistent mild right pelvicaliectasis, with high attenuation blood clot now seen within the renal collecting system. No evidence of urolithiasis or hydronephrosis. Prior cystoprostatectomy with ileal conduit. Increased mild left paraaortic retroperitoneal lymphadenopathy, consistent with metastatic disease. Electronically Signed   By: Marlyce Sine M.D.   On: 09/23/2023 15:27    ------  Assessment:  68 y.o. male with a history of bladder cancer s/p RC/IC c/b node positive disease with adjuvant radiation therapy, now with bilateral uretero-enteric strictures managed with bilateral PCNs who presented with gross hematuria of his right PCN.   Recommendations: - CT shows adequate positioning of bilateral PCNs - Labs at baseline (hgb and cr) - Urine cultures from bilateral tubes ordered - Recommend patient discharge on 2 week course of augmentin  antibiotic based on prior culture data - Instructions and supplies provided to wife for nephrostomy tube flushing PRN - Urology will coordinate with [facility] to expedite next PCN exchange (currently scheduled for 10/02/23). - No acute urologic intervention indicated. We discussed that he will likely require antegrade ureteroscopy to assess his renal pelvis and bilateral uretero-enteric anastomoses in the future, but this will not be performed  in the inpatient setting. He has follow up scheduled with Dr. Secundino Dach in the coming weeks.    Thank you for this consult. Please contact the urology consult pager with any further questions/concerns.

## 2023-09-23 NOTE — ED Notes (Signed)
 Due to large clots in Nephrostomy drainage bag, Luis [name] is trying to find a replacement bag from IR. Will change bag if able to locate one.

## 2023-09-23 NOTE — ED Provider Notes (Signed)
 [place] EMERGENCY DEPARTMENT AT [facility] Provider Note   CSN: [id number] Arrival date & time: 09/23/23  1227     History  Chief Complaint  Patient presents with   catheter blockage     Luis [name] is a 68 y.o. male.  Patient with history of bladder cancer, recently completed radiation therapy, bilateral nephrostomy tubes and colostomy in place, unable to proceed to chemotherapy at this time due to chronic kidney disease and anemia --presents to the emergency department today for evaluation of 2 to 3 days of right-sided back ache, with increasing bloody urine output through his right sided nephrostomy.  He was unable to drain the bag today due to clots.  Feels like the bag is still filling appropriately.  No fevers, nausea or vomiting.  Patient was seen by oncologist yesterday.  Current bleeding from the nephrostomy is significant compared to what he has had before.       Home Medications Prior to Admission medications   Medication Sig Start Date End Date Taking? Authorizing Provider  acetaminophen  (TYLENOL ) 325 MG tablet Take 650 mg by mouth every 6 (six) hours as needed (for pain).    [provider]  amLODipine  (NORVASC ) 5 MG tablet TAKE 1 TABLET (5 MG TOTAL) BY MOUTH DAILY. 09/19/23   Thayil, Irene T, PA-C  aspirin  EC 81 MG tablet Take 81 mg by mouth in the morning.    [provider]  carvedilol  (COREG ) 3.125 MG tablet TAKE 1 TABLET BY MOUTH TWICE A DAY WITH FOOD 05/10/23   Darlis Eisenmenger, MD  cyanocobalamin  (VITAMIN B12) 1000 MCG tablet Take 1 tablet (1,000 mcg total) by mouth daily. 06/15/23   Dorsey, John T IV, MD  diazepam  (VALIUM ) 5 MG tablet TAKE 1 TABLET BY MOUTH EVERY 12 HOURS AS NEEDED FOR ANXIETY. 03/29/23   Donley Furth, MD  famotidine  (PEPCID ) 20 MG tablet Take 1 tablet (20 mg total) by mouth 2 (two) times daily for 6 days. Patient taking differently: Take 20 mg by mouth 2 (two) times daily. As needed 08/15/23 09/14/24   Walisiewicz, Kaitlyn E, PA-C  furosemide  (LASIX ) 20 MG tablet Take 1 tablet (20 mg total) by mouth daily. 06/15/23   McLean, Dalton S, MD  ipratropium (ATROVENT ) 0.03 % nasal spray Place 2 sprays into both nostrils every 12 (twelve) hours as needed for rhinitis.    [provider]  lidocaine -prilocaine  (EMLA ) cream Apply 1 Application topically as needed. 05/16/23   Ander Bame, MD  Magnesium  500 MG TABS Take 500 mg by mouth every morning.    [provider]  meclizine  (ANTIVERT ) 25 MG tablet Take 1 tablet (25 mg total) by mouth 3 (three) times daily as needed for dizziness. 09/09/22   Donley Furth, MD  multivitamin-iron-minerals-folic acid  (CENTRUM) chewable tablet Chew 1 tablet by mouth daily. 09/23/21   Barrett, Erin R, PA-C  nitroGLYCERIN  (NITROSTAT ) 0.4 MG SL tablet Place 0.4 mg under the tongue every 5 (five) minutes as needed for chest pain. 11/03/17 12/05/25  [provider]  ondansetron  (ZOFRAN ) 8 MG tablet Take 1 tablet (8 mg total) by mouth every 8 (eight) hours as needed. 05/16/23   Dorsey, John T IV, MD  pantoprazole  (PROTONIX ) 40 MG tablet Take 1 tablet (40 mg total) by mouth 2 (two) times daily before a meal. 07/11/23   Donley Furth, MD  potassium chloride  SA (KLOR-CON  M20) 20 MEQ tablet Take 1 tablet (20 mEq total) by mouth daily. Patient taking  differently: Take 20 mEq by mouth 2 (two) times daily. 05/26/23   Milford, Arlice Bene, FNP  prochlorperazine  (COMPAZINE ) 10 MG tablet Take 1 tablet (10 mg total) by mouth every 6 (six) hours as needed for nausea or vomiting. 05/16/23   Ander Bame, MD  rosuvastatin  (CRESTOR ) 20 MG tablet Take 1 tablet (20 mg total) by mouth daily. 08/15/23   Donley Furth, MD  traMADol  (ULTRAM ) 50 MG tablet Take 1 tablet (50 mg total) by mouth every 6 (six) hours as needed. 08/09/23   Thayil, Irene T, PA-C  venlafaxine  XR (EFFEXOR -XR) 150 MG 24 hr capsule TAKE 1 CAPSULE BY MOUTH DAILY WITH BREAKFAST. 07/25/23   Donley Furth, MD       Allergies    Patient has no known allergies.    Review of Systems   Review of Systems  Physical Exam Updated Vital Signs BP (!) 150/84 (BP Location: Left Arm)   Pulse 79   Temp 99 F (37.2 C) (Oral)   Resp 18   SpO2 100%  Physical Exam Vitals and nursing note reviewed.  Constitutional:      Appearance: He is well-developed.  HENT:     Head: Normocephalic and atraumatic.     Nose: Nose normal.     Mouth/Throat:     Mouth: Mucous membranes are moist.  Eyes:     Conjunctiva/sclera: Conjunctivae normal.  Cardiovascular:     Rate and Rhythm: Normal rate.  Pulmonary:     Effort: No respiratory distress.  Abdominal:     Tenderness: There is no abdominal tenderness. There is right CVA tenderness. There is no left CVA tenderness or guarding.  Genitourinary:    Comments: Dark urine with clots in R nephrostomy bag, Yellow urine into L nephrostomy bag.  Musculoskeletal:     Cervical back: Normal range of motion and neck supple.  Skin:    General: Skin is warm and dry.  Neurological:     Mental Status: He is alert.     ED Results / Procedures / Treatments   Labs (all labs ordered are listed, but only abnormal results are displayed) Labs Reviewed  CBC WITH DIFFERENTIAL/PLATELET  BASIC METABOLIC PANEL WITH GFR    EKG None  Radiology No results found.  Procedures Procedures    Medications Ordered in ED Medications - No data to display  ED Course/ Medical Decision Making/ A&P    Patient seen and examined. History obtained directly from patient.   Labs/EKG: Ordered CBC and BMP.  Imaging: None ordered  Medications/Fluids: None ordered  Most recent vital signs reviewed and are as follows: BP (!) 150/84 (BP Location: Left Arm)   Pulse 79   Temp 99 F (37.2 C) (Oral)   Resp 18   SpO2 100%   Initial impression: Hematuria through right nephrostomy with clotting  Discussed case with Dr. Moses Arenas -- plan to discuss with urology for reccs.   2:05 PM  Discussed with Dr. Alphonza Ashing oncall urology. Reccs non-contrast CT for tube placement.   5:26 PM Reassessment performed. Patient appears stable.  Patient seen earlier by Dr. Aden Agreste.  Recommendations and treatment plan discussed.  Insights very appreciated.  Current plan is to send urine cultures, discharged home on Augmentin  to cover for infection.  Urology will work on expediting tube exchange next week.  Labs personally reviewed and interpreted including: CBC with white blood cell count 7.8, hemoglobin 8.3 which is stable; BMP with glucose 126, creatinine of 1.99 which is stable, BUN  of 25.  Imaging personally visualized and interpreted including: CT renal stone study shows that tubes are in appropriate position, there is blood in the right side collecting system.  Reviewed pertinent lab work and imaging with patient at bedside. Questions answered.   Most current vital signs reviewed and are as follows: BP 131/65 (BP Location: Left Arm)   Pulse 72   Temp 97.9 F (36.6 C) (Oral)   Resp 16   SpO2 97%   Plan: Discharge to home.   Prescriptions written for: Augmentin   Other home care instructions discussed: Patient and family to continue nephrostomy tube care at home.  ED return instructions discussed: Return with uncontrolled symptoms including fever, flank or abdominal pain.  Also if bleeding comes heavy or patient develops lightheadedness or shortness of breath.  Follow-up instructions discussed: Patient encouraged to follow-up with their urologist next week as planned.                                 Medical Decision Making Amount and/or Complexity of Data Reviewed Labs: ordered. Radiology: ordered.  Risk Prescription drug management.   Patient with bloody urine through nephrostomy tube.  This led to clots and partial blockage that was cleared.  CT reassuring.  Blood cell counts and kidney function are abnormal but stable.  Patient was seen by urologist here in the emergency  department today and treated, cleared for discharge.  Outpatient plan as above.  The patient's vital signs, pertinent lab work and imaging were reviewed and interpreted as discussed in the ED course. Hospitalization was considered for further testing, treatments, or serial exams/observation. However as patient is well-appearing, has a stable exam, and reassuring studies today, I do not feel that they warrant admission at this time. This plan was discussed with the patient who verbalizes agreement and comfort with this plan and seems reliable and able to return to the Emergency Department with worsening or changing symptoms.          Final Clinical Impression(s) / ED Diagnoses Final diagnoses:  Gross hematuria  Nephrostomy tube bleed (HCC)    Rx / DC Orders ED Discharge Orders          Ordered    amoxicillin -clavulanate (AUGMENTIN ) 875-125 MG tablet  Every 12 hours        09/23/23 1724              Shaylie Eklund, PA-C 09/23/23 1729    Guadalupe Lee, MD 09/23/23 2048

## 2023-09-23 NOTE — Discharge Instructions (Addendum)
 Your blood work today appears stable.  CT scan shows appropriately positioned nephrostomy tubes.  Your hemoglobin and kidney function are stable.  As discussed with the urologist, please take the Augmentin  to treat urinary infection.

## 2023-09-26 LAB — URINE CULTURE
Culture: >=100000 — AB
Culture: >=100000 — AB

## 2023-09-27 ENCOUNTER — Telehealth (HOSPITAL_BASED_OUTPATIENT_CLINIC_OR_DEPARTMENT_OTHER): Payer: Self-pay | Admitting: *Deleted

## 2023-09-27 NOTE — Telephone Encounter (Signed)
 Post ED Visit - Positive Culture Follow-up: Successful Patient Follow-Up  Culture assessed and recommendations reviewed by:  [x]  Denson Flake, Pharm.D. []  Skeet Duke, Pharm.D., BCPS AQ-ID []  Leslee Rase, Pharm.D., BCPS []  Garland Junk, Pharm.D., BCPS []  [place], [address].D., BCPS, AAHIVP []  Alcide Aly, Pharm.D., BCPS, AAHIVP []  Jerri Morale, PharmD, BCPS []  Graham Laws, PharmD, BCPS []  Cleda Curly, PharmD, BCPS []  Tamar Fairly, PharmD  Positive urine culture  []  Patient discharged without antimicrobial prescription and treatment is now indicated [x]  Organism is resistant to prescribed ED discharge antimicrobial []  Patient with positive blood cultures  Changes discussed with ED provider: Dorisann Garre, PA-C. New antibiotic prescription: Ciprofloxacin  250mg  every 12 hours x 7 days, in addition to previously prescribed Augmentin . Called to [facility] on [address] in [place], [place].  Contacted patient, date 09/27/2023, time 1230.   Zeb Heys 09/27/2023, 12:34 PM

## 2023-09-27 NOTE — Progress Notes (Signed)
 ED Antimicrobial Stewardship Positive Culture Follow Up   Luis [name] is an 68 y.o. male who presented to [facility] on 09/23/2023 with a chief complaint of  Chief Complaint  Patient presents with   catheter blockage     Recent Results (from the past 720 hours)  Urine Culture (for pregnant, neutropenic or urologic patients or patients with an indwelling urinary catheter)     Status: Abnormal   Collection Time: 09/23/23  4:55 PM   Specimen: Ureter; Urine  Result Value Ref Range Status   Specimen Description   Final    URETER RIGHT Performed at [facility], [address], [place], [place] [address]    Special Requests   Final    NONE Performed at [facility], [address], [place], [place] [address]    Culture (A)  Final    >=100,000 COLONIES/mL KLEBSIELLA OXYTOCA 80,000 COLONIES/mL CITROBACTER FREUNDII >=100,000 COLONIES/mL ENTEROCOCCUS FAECALIS    Report Status 09/26/2023 FINAL  Final   Organism ID, Bacteria KLEBSIELLA OXYTOCA (A)  Final   Organism ID, Bacteria CITROBACTER FREUNDII (A)  Final   Organism ID, Bacteria ENTEROCOCCUS FAECALIS (A)  Final      Susceptibility   Citrobacter freundii - MIC*    CEFEPIME  <=0.12 SENSITIVE Sensitive     CEFTAZIDIME 16 INTERMEDIATE Intermediate     CEFTRIAXONE  32 RESISTANT Resistant     CIPROFLOXACIN  <=0.25 SENSITIVE Sensitive     GENTAMICIN  <=1 SENSITIVE Sensitive     IMIPENEM <=0.25 SENSITIVE Sensitive     TRIMETH/SULFA <=20 SENSITIVE Sensitive     PIP/TAZO <=4 SENSITIVE Sensitive ug/mL    * 80,000 COLONIES/mL CITROBACTER FREUNDII   Enterococcus faecalis - MIC*    AMPICILLIN  <=2 SENSITIVE Sensitive     VANCOMYCIN  1 SENSITIVE Sensitive     GENTAMICIN  SYNERGY SENSITIVE Sensitive     * >=100,000 COLONIES/mL ENTEROCOCCUS FAECALIS   Klebsiella oxytoca - MIC*    AMPICILLIN  RESISTANT Resistant     CEFEPIME  <=0.12 SENSITIVE Sensitive     CEFTAZIDIME <=1 SENSITIVE Sensitive     CEFTRIAXONE   <=0.25 SENSITIVE Sensitive     CIPROFLOXACIN  <=0.25 SENSITIVE Sensitive     GENTAMICIN  <=1 SENSITIVE Sensitive     IMIPENEM 0.5 SENSITIVE Sensitive     TRIMETH/SULFA <=20 SENSITIVE Sensitive     AMPICILLIN /SULBACTAM 4 SENSITIVE Sensitive     PIP/TAZO <=4 SENSITIVE Sensitive ug/mL    * >=100,000 COLONIES/mL KLEBSIELLA OXYTOCA  Urine Culture (for pregnant, neutropenic or urologic patients or patients with an indwelling urinary catheter)     Status: Abnormal   Collection Time: 09/23/23  4:55 PM   Specimen: Ureter; Urine  Result Value Ref Range Status   Specimen Description   Final    URETER LEFT Performed at [facility], [address], [place], [place] [address]    Special Requests   Final    NONE Performed at [facility], [address], [place], [place] [address]    Culture (A)  Final    >=100,000 COLONIES/mL KLEBSIELLA OXYTOCA >=100,000 COLONIES/mL CITROBACTER FREUNDII    Report Status 09/26/2023 FINAL  Final   Organism ID, Bacteria KLEBSIELLA OXYTOCA (A)  Final   Organism ID, Bacteria CITROBACTER FREUNDII (A)  Final      Susceptibility   Citrobacter freundii - MIC*    CEFEPIME  <=0.12 SENSITIVE Sensitive     CEFTAZIDIME 16 INTERMEDIATE Intermediate     CEFTRIAXONE  32 RESISTANT Resistant     CIPROFLOXACIN  <=0.25 SENSITIVE Sensitive  GENTAMICIN  <=1 SENSITIVE Sensitive     IMIPENEM <=0.25 SENSITIVE Sensitive     TRIMETH/SULFA <=20 SENSITIVE Sensitive     PIP/TAZO <=4 SENSITIVE Sensitive ug/mL    * >=100,000 COLONIES/mL CITROBACTER FREUNDII   Klebsiella oxytoca - MIC*    AMPICILLIN  RESISTANT Resistant     CEFEPIME  <=0.12 SENSITIVE Sensitive     CEFTAZIDIME <=1 SENSITIVE Sensitive     CEFTRIAXONE  <=0.25 SENSITIVE Sensitive     CIPROFLOXACIN  <=0.25 SENSITIVE Sensitive     GENTAMICIN  <=1 SENSITIVE Sensitive     IMIPENEM 0.5 SENSITIVE Sensitive     TRIMETH/SULFA <=20 SENSITIVE Sensitive     AMPICILLIN /SULBACTAM 4 SENSITIVE Sensitive      PIP/TAZO <=4 SENSITIVE Sensitive ug/mL    * >=100,000 COLONIES/mL KLEBSIELLA OXYTOCA   Luis [name] is a 68 yo male with PMH bilateral nephrostomy tubes. Pt presented to ED on 5/31 with rt sided flank pain and increased bloody urine output with clots from rt nephrostomy tube. No fever, chills, nausea or vomiting. WBC WNL at 7.8. CrCl 41.6 mL/min. Pt is having nephrostomy tubes exchanged on 6/9; discharged on Augmentin . Rt sided urine culture grew ESBL citrobacter freundii, enterococcus faecalis and klebsiella oxytoca.   Plan to continue Augmentin  for enterococcus coverage. Add ciprofloxacin  250 mg PO BID x7d for ESBL citrobacter and klebsiella coverage. Low dose ciprofloxacin  preferred d/t impaired renal function. Bactrim ruled out d/t concern for renal function and bone marrow suppression.    ED Provider: Dorisann Garre, PA-C   Sissy Duff, [place] PharmD Candidate 09/27/2023, 11:52 AM Monday - Friday phone -  [phone] Saturday - Sunday phone - [phone]

## 2023-10-02 ENCOUNTER — Ambulatory Visit (HOSPITAL_COMMUNITY)
Admission: RE | Admit: 2023-10-02 | Discharge: 2023-10-02 | Disposition: A | Discharge status: Home / Self Care | Source: Ambulatory Visit | Attending: Radiology | Admitting: Radiology

## 2023-10-02 ENCOUNTER — Other Ambulatory Visit (HOSPITAL_COMMUNITY): Payer: Self-pay | Admitting: Radiology

## 2023-10-02 DIAGNOSIS — Z436 Encounter for attention to other artificial openings of urinary tract: Secondary | ICD-10-CM | POA: Diagnosis not present

## 2023-10-02 DIAGNOSIS — Z936 Other artificial openings of urinary tract status: Secondary | ICD-10-CM

## 2023-10-02 HISTORY — PX: IR NEPHROSTOMY EXCHANGE LEFT: IMG6069

## 2023-10-02 MED ORDER — LIDOCAINE HCL 1 % IJ SOLN
20.0000 mL | Freq: Once | INTRAMUSCULAR | Status: AC
  Administered 2023-10-02: 10 mL via INTRADERMAL

## 2023-10-02 MED ORDER — LIDOCAINE HCL 1 % IJ SOLN
INTRAMUSCULAR | Status: AC
  Filled 2023-10-02: qty 20

## 2023-10-02 MED ORDER — IOHEXOL 300 MG/ML  SOLN
50.0000 mL | Freq: Once | INTRAMUSCULAR | Status: AC | PRN
  Administered 2023-10-02: 20 mL

## 2023-10-11 DIAGNOSIS — H35033 Hypertensive retinopathy, bilateral: Secondary | ICD-10-CM | POA: Diagnosis not present

## 2023-10-11 DIAGNOSIS — H52223 Regular astigmatism, bilateral: Secondary | ICD-10-CM | POA: Diagnosis not present

## 2023-10-11 DIAGNOSIS — H02052 Trichiasis without entropian right lower eyelid: Secondary | ICD-10-CM | POA: Diagnosis not present

## 2023-10-11 DIAGNOSIS — H524 Presbyopia: Secondary | ICD-10-CM | POA: Diagnosis not present

## 2023-10-11 DIAGNOSIS — H43812 Vitreous degeneration, left eye: Secondary | ICD-10-CM | POA: Diagnosis not present

## 2023-10-11 DIAGNOSIS — Z961 Presence of intraocular lens: Secondary | ICD-10-CM | POA: Diagnosis not present

## 2023-10-11 DIAGNOSIS — D3131 Benign neoplasm of right choroid: Secondary | ICD-10-CM | POA: Diagnosis not present

## 2023-10-16 DIAGNOSIS — Z936 Other artificial openings of urinary tract status: Secondary | ICD-10-CM | POA: Diagnosis not present

## 2023-10-16 DIAGNOSIS — Z515 Encounter for palliative care: Secondary | ICD-10-CM | POA: Diagnosis not present

## 2023-10-16 DIAGNOSIS — C678 Malignant neoplasm of overlapping sites of bladder: Secondary | ICD-10-CM | POA: Diagnosis not present

## 2023-10-16 DIAGNOSIS — C775 Secondary and unspecified malignant neoplasm of intrapelvic lymph nodes: Secondary | ICD-10-CM | POA: Diagnosis not present

## 2023-10-16 DIAGNOSIS — N170 Acute kidney failure with tubular necrosis: Secondary | ICD-10-CM | POA: Diagnosis not present

## 2023-10-20 ENCOUNTER — Inpatient Hospital Stay: Attending: Hematology and Oncology | Admitting: Hematology and Oncology

## 2023-10-20 ENCOUNTER — Other Ambulatory Visit: Payer: Self-pay | Admitting: Hematology and Oncology

## 2023-10-20 ENCOUNTER — Inpatient Hospital Stay

## 2023-10-20 VITALS — BP 149/69 | HR 57 | Temp 98.0°F | Resp 15 | Wt 263.3 lb

## 2023-10-20 DIAGNOSIS — N179 Acute kidney failure, unspecified: Secondary | ICD-10-CM | POA: Insufficient documentation

## 2023-10-20 DIAGNOSIS — C679 Malignant neoplasm of bladder, unspecified: Secondary | ICD-10-CM

## 2023-10-20 DIAGNOSIS — C671 Malignant neoplasm of dome of bladder: Secondary | ICD-10-CM | POA: Insufficient documentation

## 2023-10-20 DIAGNOSIS — D649 Anemia, unspecified: Secondary | ICD-10-CM | POA: Diagnosis not present

## 2023-10-20 DIAGNOSIS — Z95828 Presence of other vascular implants and grafts: Secondary | ICD-10-CM | POA: Diagnosis not present

## 2023-10-20 DIAGNOSIS — Z801 Family history of malignant neoplasm of trachea, bronchus and lung: Secondary | ICD-10-CM | POA: Diagnosis not present

## 2023-10-20 DIAGNOSIS — Z800 Family history of malignant neoplasm of digestive organs: Secondary | ICD-10-CM | POA: Diagnosis not present

## 2023-10-20 DIAGNOSIS — R109 Unspecified abdominal pain: Secondary | ICD-10-CM | POA: Diagnosis not present

## 2023-10-20 DIAGNOSIS — Z936 Other artificial openings of urinary tract status: Secondary | ICD-10-CM | POA: Insufficient documentation

## 2023-10-20 DIAGNOSIS — E876 Hypokalemia: Secondary | ICD-10-CM | POA: Insufficient documentation

## 2023-10-20 DIAGNOSIS — C673 Malignant neoplasm of anterior wall of bladder: Secondary | ICD-10-CM | POA: Diagnosis not present

## 2023-10-20 LAB — CMP (CANCER CENTER ONLY)
ALT: 9 U/L (ref 0–44)
AST: 13 U/L — ABNORMAL LOW (ref 15–41)
Albumin: 3.8 g/dL (ref 3.5–5.0)
Alkaline Phosphatase: 88 U/L (ref 38–126)
Anion gap: 7 (ref 5–15)
BUN: 24 mg/dL — ABNORMAL HIGH (ref 8–23)
CO2: 25 mmol/L (ref 22–32)
Calcium: 8.6 mg/dL — ABNORMAL LOW (ref 8.9–10.3)
Chloride: 105 mmol/L (ref 98–111)
Creatinine: 1.92 mg/dL — ABNORMAL HIGH (ref 0.61–1.24)
GFR, Estimated: 37 mL/min — ABNORMAL LOW (ref >60)
Glucose, Bld: 148 mg/dL — ABNORMAL HIGH (ref 70–99)
Potassium: 4.3 mmol/L (ref 3.5–5.1)
Sodium: 137 mmol/L (ref 135–145)
Total Bilirubin: 0.5 mg/dL (ref 0.0–1.2)
Total Protein: 7.1 g/dL (ref 6.5–8.1)

## 2023-10-20 LAB — CBC WITH DIFFERENTIAL (CANCER CENTER ONLY)
Abs Immature Granulocytes: 0.03 10*3/uL (ref 0.00–0.07)
Basophils Absolute: 0 10*3/uL (ref 0.0–0.1)
Basophils Relative: 0 %
Eosinophils Absolute: 0.4 10*3/uL (ref 0.0–0.5)
Eosinophils Relative: 7 %
HCT: 27.9 % — ABNORMAL LOW (ref 39.0–52.0)
Hemoglobin: 9 g/dL — ABNORMAL LOW (ref 13.0–17.0)
Immature Granulocytes: 1 %
Lymphocytes Relative: 11 %
Lymphs Abs: 0.5 10*3/uL — ABNORMAL LOW (ref 0.7–4.0)
MCH: 30.1 pg (ref 26.0–34.0)
MCHC: 32.3 g/dL (ref 30.0–36.0)
MCV: 93.3 fL (ref 80.0–100.0)
Monocytes Absolute: 0.4 10*3/uL (ref 0.1–1.0)
Monocytes Relative: 7 %
Neutro Abs: 3.6 10*3/uL (ref 1.7–7.7)
Neutrophils Relative %: 74 %
Platelet Count: 117 10*3/uL — ABNORMAL LOW (ref 150–400)
RBC: 2.99 MIL/uL — ABNORMAL LOW (ref 4.22–5.81)
RDW: 16.8 % — ABNORMAL HIGH (ref 11.5–15.5)
WBC Count: 4.9 10*3/uL (ref 4.0–10.5)
nRBC: 0 % (ref 0.0–0.2)

## 2023-10-20 MED ORDER — SODIUM CHLORIDE 0.9% FLUSH
10.0000 mL | Freq: Once | INTRAVENOUS | Status: AC
  Administered 2023-10-20: 10 mL

## 2023-10-20 MED ORDER — HEPARIN SOD (PORK) LOCK FLUSH 100 UNIT/ML IV SOLN
500.0000 [IU] | Freq: Once | INTRAVENOUS | Status: AC
  Administered 2023-10-20: 500 [IU]

## 2023-10-20 NOTE — Progress Notes (Unsigned)
 [facility] [phone]   [phone]  PROGRESS NOTE  Patient Care Team: Johnny Garnette LABOR, MD as PCP - General Shlomo Wilbert SAUNDERS, MD as PCP - Sleep Medicine (Cardiology) Rolan Ezra RAMAN, MD as PCP - Advanced Heart Failure (Cardiology) Kelsie Agent, MD (Inactive) as Consulting Physician (Cardiology) Liane Sharyne MATSU, [facility] (Inactive) as Pharmacist (Pharmacist)  Hematological/Oncological History # BCG-Unresponsive High Risk Non-Muscle Invasive Bladder Cancer 06/2020: left bladder neck recurrence, TURBT T1G3 11/2020: restaging TURBT showed CIS 12/2020: redinduction BCG x6 (deemed not a good surgical candidate)  06/2021: left dome early recurrence 3 cm erythema, no papillary tumor 04/2021: chronic left dome erythema, new left base lateral papillary tumor (3 cm) 06/18/2022: T1G3 prostatic urethra and multifocal bladder 07/29/2022: establish care with Dr. Federico  08/12/2022: Cycle 1 of Pembrolizumab  09/02/2022: Cycle 2 of Pembrolizumab   09/23/2022: Cycle 3 of Pembrolizumab  10/14/2022: Cycle 4 of Pembrolizumab  11/04/2022: Cycle 5 of Pembrolizumab  11/25/2022: Cycle 6 of Pembrolizumab  12/20/2022: Cycle 7 of Pembrolizumab  01/13/2023: Cycle 8 of Pembrolizumab   03/03/2023: Cystectomy/Prostatectomy showed infiltrative high-grade urothelial carcinoma, size 5.8 cm involving bladder dome, anterior wall and prostatic stroma. Tumor invades directly into prostatic stroma at apex and mid portion of the gland (pT4a). Metastatic urothelial cancer in one of two left common iliac lymph nodes.  06/19/2023-07/27/2023: Received adjuvant radiation to surgical bed.  He received 50.4 Gray in 28 fractions.  CHIEF COMPLAINTS/PURPOSE OF CONSULTATION:  High Risk Non-Muscle Invasive Bladder Cancer   HISTORY OF PRESENTING ILLNESS:  Luis [name] 68 y.o. 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  He was  last seen on 09/22/2023.   He underwent bilateral nephrostomy placement as well and recently had an exchange on 10/02/2023. CT scan on 09/23/2023 showed increasing size of lymph nodes concerning for metastatic bladder cancer.  On exam today Luis [name] reports he is disheartened by the fact that his nephrostomy tubes will never be removed.  He is also saddened by the fact that his CT scan showed what appeared to be progression of his lymph nodes.  He is aware that this has a poor prognosis and that his expected lifespan has considerably shortened.  He notes that he has not had any trouble with nausea, vomiting, or diarrhea.  He is not having any pain.  He reports that his tubes are frequently putting out clear to yellow urine but unfortunately did have a single episode where he passed a very large clot.  Otherwise his energy levels are good and he is willing and able to proceed with treatment with enfortumab.  A full 10 point ROS is otherwise negative.  The bulk of our discussion today focused on the progression of disease.  I would recommend a PET CT scan in order to assure these lymph nodes represent metastatic disease.  Additionally this will give us  a good baseline for the spread.  We talked about different options moving forward and this time he would be considered a candidate for Enfortumab infusions.  This medication can be given with Keytruda , but he received extensive Keytruda  therapy last year and therefore I think it would be best to proceed with this therapy.  He voices understanding of the prognosis, findings, and recommended treatment course.  He was willing and able to proceed.  MEDICAL HISTORY:  Past Medical History:  Diagnosis Date   Anxiety    takes Valium  as needed   Aortic stenosis, moderate  Arthritis    Ascending aortic aneurysm (HCC)    CAD (coronary artery disease)    a. s/p PCI of the RCA 8/12 with DES by Dr Wonda, preserved EF. b. LHC/RHC (2/16) with mean RA 12, PA 32/15,  mean PCWP 18, CI 3.47; patent mid and distal RCA stents, 50-60% proximal stenosis small PDA.      Cancer [facility])    bladder   Carotid stenosis    a. Carotid US  (05/2013):  Bilateral 1-39% ICA; L thyroid  nodule (prior hx of aspiration).   Chronic diastolic CHF (congestive heart failure) (HCC)    Complication of anesthesia    difficulty waking up after gallbladder surgery   Depression    Dyspnea    Essential hypertension    GERD (gastroesophageal reflux disease)    if needed will take OTC meds    Heart murmur    History of colonic polyps    hyperplastic   Hyperlipidemia    Joint pain    Lesion of bladder    Myocardial infarction (HCC) 2012   Obesity (BMI 30-39.9) 02/29/2016   Pre-diabetes    Restless leg    Sleep apnea    uses cpap   Tubular adenoma of colon    Vertigo    takes Meclizine  as needed    SURGICAL HISTORY: Past Surgical History:  Procedure Laterality Date   AORTIC VALVE REPLACEMENT N/A 09/16/2021   Procedure: AORTIC VALVE REPLACEMENT (AVR);  Surgeon: Lucas Dorise POUR, MD;  Location: [facility] OR;  Service: Open Heart Surgery;  Laterality: N/A;   CATARACT EXTRACTION   4 YRS AGO   BOTH EYES   CHOLECYSTECTOMY  07/21/2011   Procedure: LAPAROSCOPIC CHOLECYSTECTOMY WITH INTRAOPERATIVE CHOLANGIOGRAM;  Surgeon: Alm VEAR Angle, MD;  Location: [facility]S;  Service: General;  Laterality: N/A;   CORONARY ANGIOPLASTY  2012   2 stents   coronary stenting     s/p PCI of the RCA by Dr Wonda 8/12 with 2 promus stents   CYSTOSCOPY W/ RETROGRADES Bilateral 12/13/2019   Procedure: CYSTOSCOPY WITH RETROGRADE PYELOGRAM;  Surgeon: Alvaro Hummer, MD;  Location: [facility];  Service: Urology;  Laterality: Bilateral;   CYSTOSCOPY W/ RETROGRADES Bilateral 10/14/2020   Procedure: CYSTOSCOPY WITH RETROGRADE PYELOGRAM;  Surgeon: Alvaro Hummer, MD;  Location: [facility];  Service: Urology;  Laterality: Bilateral;   CYSTOSCOPY W/ RETROGRADES Bilateral 12/16/2020    Procedure: CYSTOSCOPY WITH RETROGRADE PYELOGRAM;  Surgeon: Alvaro Hummer, MD;  Location: [facility];  Service: Urology;  Laterality: Bilateral;   CYSTOSCOPY W/ RETROGRADES Bilateral 06/03/2022   Procedure: CYSTOSCOPY WITH RETROGRADE PYELOGRAM;  Surgeon: Alvaro Hummer, MD;  Location: [facility];  Service: Urology;  Laterality: Bilateral;   CYSTOSCOPY W/ RETROGRADES Bilateral 12/28/2022   Procedure: CYSTOSCOPY WITH RETROGRADE PYELOGRAM, FULGARATION OF BLEEDERS;  Surgeon: Alvaro Hummer KATHEE Mickey., MD;  Location: [facility];  Service: Urology;  Laterality: Bilateral;   CYSTOSCOPY WITH INJECTION N/A 03/03/2023   Procedure: CYSTOSCOPY WITH INDOCYANINE INJECTION;  Surgeon: Alvaro Hummer KATHEE Mickey., MD;  Location: [facility];  Service: Urology;  Laterality: N/A;  360 MINUTES   ESOPHAGOGASTRODUODENOSCOPY N/A 06/28/2023   Procedure: EGD (ESOPHAGOGASTRODUODENOSCOPY);  Surgeon: Charlanne Groom, MD;  Location: [facility];  Service: Gastroenterology;  Laterality: N/A;   IR IMAGING GUIDED PORT INSERTION  05/29/2023   IR NEPHROSTOMY EXCHANGE LEFT  08/07/2023   IR NEPHROSTOMY EXCHANGE LEFT  10/02/2023   IR NEPHROSTOMY PLACEMENT RIGHT  07/01/2023   IR RADIOLOGY PERIPHERAL GUIDED IV START  07/01/2023   IR  US  GUIDE VASC ACCESS LEFT  07/01/2023   LEFT AND RIGHT HEART CATHETERIZATION WITH CORONARY ANGIOGRAM N/A 06/23/2014   Procedure: LEFT AND RIGHT HEART CATHETERIZATION WITH CORONARY ANGIOGRAM;  Surgeon: Ezra GORMAN Shuck, MD;  Location: [facility] CATH LAB;  Service: Cardiovascular;  Laterality: N/A;   NECK SURGERY  03/23/09   per Dr. Barbarann, cervical fusion    PERICARDIOCENTESIS N/A 09/28/2021   Procedure: PERICARDIOCENTESIS;  Surgeon: Dann Candyce GORMAN, MD;  Location: [facility] INVASIVE CV LAB;  Service: Cardiovascular;  Laterality: N/A;   REPLACEMENT ASCENDING AORTA N/A 09/16/2021   Procedure: REPLACEMENT ASCENDING AORTA WITH 30 X HEMASHIELD PLATINUM WOVEN DOUBLE VELOUR VASCULAR GRAFT;  Surgeon: Lucas Dorise POUR, MD;  Location: [facility] OR;   Service: Open Heart Surgery;  Laterality: N/A;  CIRC ARREST   right elbow surgery     RIGHT HEART CATH N/A 06/15/2023   Procedure: RIGHT HEART CATH;  Surgeon: Shuck Ezra GORMAN, MD;  Location: [facility] INVASIVE CV LAB;  Service: Cardiovascular;  Laterality: N/A;   RIGHT HEART CATH AND CORONARY ANGIOGRAPHY N/A 07/08/2021   Procedure: RIGHT HEART CATH AND CORONARY ANGIOGRAPHY;  Surgeon: Shuck Ezra GORMAN, MD;  Location: [facility] INVASIVE CV LAB;  Service: Cardiovascular;  Laterality: N/A;   ROBOT ASSISTED LAPAROSCOPIC COMPLETE CYSTECT ILEAL CONDUIT N/A 03/03/2023   Procedure: XI ROBOTIC ASSISTED LAPAROSCOPIC COMPLETE CYSTECTECTOMY WITH  ILEAL CONDUIT DIVERSION;  Surgeon: Alvaro Ricardo KATHEE Mickey., MD;  Location: [facility];  Service: Urology;  Laterality: N/A;   ROBOT ASSISTED LAPAROSCOPIC RADICAL PROSTATECTOMY N/A 03/03/2023   Procedure: XI ROBOTIC ASSISTED LAPAROSCOPIC RADICAL PROSTATECTOMY WITH LYMPH NODE DISSECTION;  Surgeon: Alvaro Ricardo KATHEE Mickey., MD;  Location: [facility];  Service: Urology;  Laterality: N/A;   solonscopy  05/23/08   per Dr. Jakie inch hemorrhoids only, repeat in 5 years   SUBXYPHOID PERICARDIAL WINDOW N/A 09/28/2021   Procedure: SUBXYPHOID PERICARDIAL WINDOW;  Surgeon: Lucas Dorise POUR, MD;  Location: [facility];  Service: Thoracic;  Laterality: N/A;   TEE WITHOUT CARDIOVERSION N/A 06/23/2014   Procedure: TRANSESOPHAGEAL ECHOCARDIOGRAM (TEE);  Surgeon: Ezra GORMAN Shuck, MD;  Location: [facility] ENDOSCOPY;  Service: Cardiovascular;  Laterality: N/A;   TEE WITHOUT CARDIOVERSION N/A 01/21/2016   Procedure: TRANSESOPHAGEAL ECHOCARDIOGRAM (TEE);  Surgeon: Ezra GORMAN Shuck, MD;  Location: [facility] ENDOSCOPY;  Service: Cardiovascular;  Laterality: N/A;   TEE WITHOUT CARDIOVERSION N/A 09/16/2021   Procedure: TRANSESOPHAGEAL ECHOCARDIOGRAM (TEE);  Surgeon: Lucas Dorise POUR, MD;  Location: [facility] OR;  Service: Open Heart Surgery;  Laterality: N/A;   TEE WITHOUT CARDIOVERSION N/A 09/28/2021   Procedure: TRANSESOPHAGEAL ECHOCARDIOGRAM  (TEE);  Surgeon: Lucas Dorise POUR, MD;  Location: [facility] OR;  Service: Thoracic;  Laterality: N/A;   TONSILLECTOMY     TRANSESOPHAGEAL ECHOCARDIOGRAM (CATH LAB) N/A 04/17/2023   Procedure: TRANSESOPHAGEAL ECHOCARDIOGRAM;  Surgeon: Jeffrie Oneil BROCKS, MD;  Location: [facility] INVASIVE CV LAB;  Service: Cardiovascular;  Laterality: N/A;   TRANSURETHRAL RESECTION OF BLADDER TUMOR N/A 10/21/2019   Procedure: TRANSURETHRAL RESECTION OF BLADDER TUMOR (TURBT);  Surgeon: Ottelin, Mark, MD;  Location: [facility];  Service: Urology;  Laterality: N/A;   TRANSURETHRAL RESECTION OF BLADDER TUMOR N/A 12/13/2019   Procedure: TRANSURETHRAL RESECTION OF BLADDER TUMOR (TURBT);  Surgeon: Alvaro Ricardo, MD;  [facility]: [facility];  Service: Urology;  Laterality: N/A;  1 HR   TRANSURETHRAL RESECTION OF BLADDER TUMOR N/A 10/14/2020   Procedure: TRANSURETHRAL RESECTION OF BLADDER TUMOR (TURBT);  Surgeon: Alvaro Ricardo, MD;  Location: [facility];  Service: Urology;  Laterality: N/A;  TRANSURETHRAL RESECTION OF BLADDER TUMOR N/A 12/16/2020   Procedure: RESTAGING TRANSURETHRAL RESECTION OF BLADDER TUMOR (TURBT);  Surgeon: Alvaro Hummer, MD;  Location: [facility];  Service: Urology;  Laterality: N/A;   TRANSURETHRAL RESECTION OF BLADDER TUMOR N/A 06/03/2022   Procedure: TRANSURETHRAL RESECTION OF BLADDER TUMOR (TURBT);  Surgeon: Alvaro Hummer, MD;  Location: [facility];  Service: Urology;  Laterality: N/A;   UMBILICAL HERNIA REPAIR  03/03/2023   Procedure: HERNIA REPAIR UMBILICAL;  Surgeon: Alvaro Hummer KATHEE Mickey., MD;  Location: [facility];  Service: Urology;;    SOCIAL HISTORY: Social History   Socioeconomic History   Marital status: Married    Spouse name: Not on file   Number of children: 4   Years of education: Not on file   Highest education level: Not on file  Occupational History   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  Tobacco Use    Smoking status: Former    Current packs/day: 0.00    Average packs/day: 2.0 packs/day for 40.0 years (80.0 ttl pk-yrs)    Types: Cigarettes    Start date: 60    Quit date: 2012    Years since quitting: 13.4    Passive exposure: Never   Smokeless tobacco: Never  Vaping Use   Vaping status: Never Used  Substance and Sexual Activity   Alcohol use: No    Alcohol/week: 0.0 standard drinks of alcohol   Drug use: No   Sexual activity: Not on file  Other Topics Concern   Not on file  Social History Narrative   Not on file   Social Drivers of Health   Financial Resource Strain: Low Risk  (11/03/2022)   Overall Financial Resource Strain (CARDIA)    Difficulty of Paying Living Expenses: Not hard at all  Food Insecurity: No Food Insecurity (07/04/2023)   Hunger Vital Sign    Worried About Running Out of Food in the Last Year: Never true    Ran Out of Food in the Last Year: Never true  Transportation Needs: No Transportation Needs (07/04/2023)   PRAPARE - Administrator, Civil Service (Medical): No    Lack of Transportation (Non-Medical): No  Physical Activity: Unknown (11/03/2022)   Exercise Vital Sign    Days of Exercise per Week: Patient unable to answer    Minutes of Exercise per Session: 30 min  Stress: No Stress Concern Present (11/03/2022)   [employer] of Occupational Health - Occupational Stress Questionnaire    Feeling of Stress : Not at all  Social Connections: Moderately Isolated (06/27/2023)   Social Connection and Isolation Panel    Frequency of Communication with Friends and Family: More than three times a week    Frequency of Social Gatherings with Friends and Family: More than three times a week    Attends Religious Services: Never    Database administrator or Organizations: No    Attends Banker Meetings: Never    Marital Status: Married  Catering manager Violence: Not At Risk (07/04/2023)   Humiliation, Afraid, Rape, and Kick  questionnaire    Fear of Current or Ex-Partner: No    Emotionally Abused: No    Physically Abused: No    Sexually Abused: No    FAMILY HISTORY: Family History  Problem Relation Age of Onset   Lung cancer Mother        lung   Esophageal cancer Cousin    Colon cancer Neg Hx  Rectal cancer Neg Hx    Stomach cancer Neg Hx     ALLERGIES:  has no known allergies.  MEDICATIONS:  Current Outpatient Medications  Medication Sig Dispense Refill   acetaminophen  (TYLENOL ) 325 MG tablet Take 650 mg by mouth every 6 (six) hours as needed (for pain).     amLODipine  (NORVASC ) 5 MG tablet TAKE 1 TABLET (5 MG TOTAL) BY MOUTH DAILY. 90 tablet 1   amoxicillin -clavulanate (AUGMENTIN ) 875-125 MG tablet Take 1 tablet by mouth every 12 (twelve) hours. 20 tablet 0   aspirin  EC 81 MG tablet Take 81 mg by mouth in the morning.     carvedilol  (COREG ) 3.125 MG tablet TAKE 1 TABLET BY MOUTH TWICE A DAY WITH FOOD 180 tablet 1   cyanocobalamin  (VITAMIN B12) 1000 MCG tablet Take 1 tablet (1,000 mcg total) by mouth daily. 30 tablet 6   diazepam  (VALIUM ) 5 MG tablet TAKE 1 TABLET BY MOUTH EVERY 12 HOURS AS NEEDED FOR ANXIETY. 60 tablet 5   famotidine  (PEPCID ) 20 MG tablet Take 1 tablet (20 mg total) by mouth 2 (two) times daily for 6 days. (Patient taking differently: Take 20 mg by mouth 2 (two) times daily. As needed) 12 tablet 0   ferrous sulfate  325 (65 FE) MG tablet Take 1 tablet (325 mg total) by mouth daily with breakfast. Please take with a source of Vitamin C 90 tablet 3   furosemide  (LASIX ) 20 MG tablet Take 1 tablet (20 mg total) by mouth daily. 45 tablet 3   ipratropium (ATROVENT ) 0.03 % nasal spray Place 2 sprays into both nostrils every 12 (twelve) hours as needed for rhinitis.     lidocaine -prilocaine  (EMLA ) cream Apply 1 Application topically as needed. 30 g 0   Magnesium  500 MG TABS Take 500 mg by mouth every morning.     meclizine  (ANTIVERT ) 25 MG tablet Take 1 tablet (25 mg total) by mouth 3  (three) times daily as needed for dizziness. 30 tablet 0   multivitamin-iron-minerals-folic acid  (CENTRUM) chewable tablet Chew 1 tablet by mouth daily.     nitroGLYCERIN  (NITROSTAT ) 0.4 MG SL tablet Place 0.4 mg under the tongue every 5 (five) minutes as needed for chest pain.     ondansetron  (ZOFRAN ) 8 MG tablet Take 1 tablet (8 mg total) by mouth every 8 (eight) hours as needed. 30 tablet 0   pantoprazole  (PROTONIX ) 40 MG tablet Take 1 tablet (40 mg total) by mouth 2 (two) times daily before a meal. 60 tablet 5   potassium chloride  SA (KLOR-CON  M20) 20 MEQ tablet Take 1 tablet (20 mEq total) by mouth daily. (Patient taking differently: Take 20 mEq by mouth 2 (two) times daily.)     prochlorperazine  (COMPAZINE ) 10 MG tablet Take 1 tablet (10 mg total) by mouth every 6 (six) hours as needed for nausea or vomiting. 30 tablet 0   rosuvastatin  (CRESTOR ) 20 MG tablet Take 1 tablet (20 mg total) by mouth daily. 90 tablet 3   traMADol  (ULTRAM ) 50 MG tablet Take 1 tablet (50 mg total) by mouth every 6 (six) hours as needed. 30 tablet 0   venlafaxine  XR (EFFEXOR -XR) 150 MG 24 hr capsule TAKE 1 CAPSULE BY MOUTH DAILY WITH BREAKFAST. 90 capsule 0   No current facility-administered medications for this visit.    REVIEW OF SYSTEMS:   Constitutional: ( - ) fevers, ( - )  chills , ( - ) night sweats Eyes: ( - ) blurriness of vision, ( - ) double vision, ( - )  watery eyes Ears, nose, mouth, throat, and face: ( - ) mucositis, ( - ) sore throat Respiratory: ( - ) cough, ( - ) dyspnea, ( - ) wheezes Cardiovascular: ( - ) palpitation, ( - ) chest discomfort, ( - ) lower extremity swelling Gastrointestinal:  ( - ) nausea, ( - ) heartburn, ( - ) change in bowel habits Skin: ( - ) abnormal skin rashes Lymphatics: ( - ) new lymphadenopathy, ( - ) easy bruising Neurological: ( - ) numbness, ( - ) tingling, ( - ) new weaknesses Behavioral/Psych: ( - ) mood change, ( - ) new changes  All other systems were reviewed  with the patient and are negative.  PHYSICAL EXAMINATION: ECOG PERFORMANCE STATUS: 1 - Symptomatic but completely ambulatory  There were no vitals filed for this visit.   There were no vitals filed for this visit.     GENERAL: well appearing elderly Caucasian male in NAD  SKIN: skin color, texture, turgor are normal, no rashes or significant lesions EYES: conjunctiva are pink and non-injected, sclera clear LUNGS: clear to auscultation and percussion with normal breathing effort HEART: regular rate & rhythm and no murmurs and no lower extremity edema Musculoskeletal: no cyanosis of digits and no clubbing.  PSYCH: alert & oriented x 3, fluent speech NEURO: no focal motor/sensory deficits  LABORATORY DATA:  I have reviewed the data as listed    Latest Ref Rng & Units 09/23/2023    2:44 PM 09/22/2023   11:06 AM 09/06/2023   12:03 PM  CBC  WBC 4.0 - 10.5 K/uL 7.8  5.4  10.7   Hemoglobin 13.0 - 17.0 g/dL 8.3  8.5  9.5   Hematocrit 39.0 - 52.0 % 26.2  25.8  29.5   Platelets 150 - 400 K/uL 178  168  136        Latest Ref Rng & Units 09/23/2023    2:44 PM 09/22/2023   11:06 AM 09/06/2023   12:03 PM  CMP  Glucose 70 - 99 mg/dL 873  880  877   BUN 8 - 23 mg/dL 25  23  35   Creatinine 0.61 - 1.24 mg/dL 8.00  8.04  8.27   Sodium 135 - 145 mmol/L 139  137  136   Potassium 3.5 - 5.1 mmol/L 4.1  4.1  4.3   Chloride 98 - 111 mmol/L 105  103  101   CO2 22 - 32 mmol/L 25  29  28    Calcium  8.9 - 10.3 mg/dL 8.5  8.7  8.6   Total Protein 6.5 - 8.1 g/dL  7.5  7.4   Total Bilirubin 0.0 - 1.2 mg/dL  0.5  0.8   Alkaline Phos 38 - 126 U/L  103  98   AST 15 - 41 U/L  11  21   ALT 0 - 44 U/L  11  36     RADIOGRAPHIC STUDIES: IR NEPHROSTOMY EXCHANGE BILATERAL Result Date: 10/02/2023 INDICATION: s/p bilat PCN's 3/8; needs f/u bilat PCN exchanges in 6 weeks Routine exchange. Briefly, extending male with history of bladder cancer and cystectomy with ARF requiring nephrostomies 07/01/2023. EXAM:  BILATERAL NEPHROSTOMY CATHETER EXCHANGE COMPARISON:  IR fluoroscopy, 08/07/2023.  CT AP, 07/24/2023. CONTRAST:  A total of 20 mL Isovue -300 administered was administered into both collecting systems FLUOROSCOPY: Radiation Exposure Index and estimated peak skin dose (PSD); Reference air kerma (RAK), 25 mGy. COMPLICATIONS: None immediate. TECHNIQUE: Informed written consent was obtained from the patient after a discussion of  the risks, benefits and alternatives to treatment. Questions regarding the procedure were encouraged and answered. A timeout was performed prior to the initiation of the procedure. The flanks and external portions of existing nephrostomy catheters were prepped and draped in the usual sterile fashion. A sterile drape was applied covering the operative field. Maximum barrier sterile technique with sterile gowns and gloves were used for the procedure. A timeout was performed prior to the initiation of the procedure. A pre procedural spot fluoroscopic image was obtained. Beginning with the LEFT nephrostomy, a small amount of contrast was injected via the existing catheter demonstrating appropriate positioning within the renal pelvis. The existing nephrostomy catheter was cut and cannulated with a Benson wire which was coiled within the renal pelvis. Under intermittent fluoroscopic guidance, the existing nephrostomy catheter was exchanged for a new 10 Fr drainage catheter. Limited contrast injection confirmed appropriate positioning within the LEFT renal pelvis and a post exchange fluoroscopic image was obtained. The catheter was locked, secured to the skin with an interrupted suture and reconnected to a gravity bag. The procedure was repeated for the contralateral, RIGHT-sided nephrostomy, ultimately allowing successful exchange of a new 10 Fr drainage catheter with end coiled and locked within the renal pelvis. Dressings were placed. The patient tolerated the above procedures well without immediate  postprocedural complication. FINDINGS: The existing nephrostomy catheters are appropriately positioned and functioning. After successful fluoroscopic guided exchange, new bilateral 10 Fr nephrostomy catheters are coiled and locked within the respective renal pelves. IMPRESSION: Successful fluoroscopic guided exchange of bilateral 10 Fr percutaneous nephrostomy catheters. RECOMMENDATIONS: The patient will return to Vascular Interventional Radiology (VIR) for routine drainage catheter evaluation and exchange in 6-8 weeks. Thom Hall, MD [facility] Electronically Signed   By: Thom Hall M.D.   On: 10/02/2023 17:25   CT Renal Stone Study Result Date: 09/23/2023 CLINICAL DATA:  Right-sided flank pain. Hematuria. Percutaneous nephrostomies. Previous cystectomy for bladder carcinoma. * Tracking Code: BO * EXAM: CT ABDOMEN AND PELVIS WITHOUT CONTRAST TECHNIQUE: Multidetector CT imaging of the abdomen and pelvis was performed following the standard protocol without IV contrast. RADIATION DOSE REDUCTION: This exam was performed according to the departmental dose-optimization program which includes automated exposure control, adjustment of the mA and/or kV according to patient size and/or use of iterative reconstruction technique. COMPARISON:  06/27/2023 FINDINGS: Lower chest: No acute findings. Hepatobiliary: No mass visualized on this unenhanced exam. Prior cholecystectomy. No evidence of biliary obstruction. Pancreas: No mass or inflammatory process visualized on this unenhanced exam. Spleen:  Within normal limits in size. Adrenals/Urinary tract: Bilateral percutaneous nephrostomy tubes now seen in place. Persistent mild right pelvicaliectasis is seen, with high attenuation material now seen within the renal collecting system, consistent with blood clot. Right perinephric soft tissue stranding is seen, without No evidence of perinephric hematoma. No ureteral  calculi identified. Prior cystoprostatectomy with ileal conduit again noted. Stomach/Bowel: No evidence of obstruction, inflammatory process, or abnormal fluid collections. Vascular/Lymphatic: Increased retroperitoneal lymphadenopathy seen in left paraaortic region, now measuring 2.4 cm on image 36/2 compared to 1.6 cm previously. No new sites of lymphadenopathy identified. No evidence of abdominal aortic aneurysm. Reproductive:  Prior cystoprostatectomy. Other:  None. Musculoskeletal:  No suspicious bone lesions identified. IMPRESSION: Bilateral percutaneous nephrostomy tubes in place. Persistent mild right pelvicaliectasis, with high attenuation blood clot now seen within the renal collecting system. No evidence of urolithiasis or hydronephrosis. Prior cystoprostatectomy with ileal conduit. Increased mild left paraaortic retroperitoneal lymphadenopathy, consistent with metastatic disease. Electronically  Signed   By: Norleen DELENA Kil M.D.   On: 09/23/2023 15:27     ASSESSMENT & PLAN JEFERSON BOOZER 68 y.o. 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  After review of the labs, review of the records, and discussion with the patient the patients findings are most consistent with BCG unresponsive high risk non-muscle invasive bladder cancer, not a candidate for cystectomy.  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 21 days until progression or intolerance up to 24 months. --Received 8 cycles of Pembrolizumab  on 08/12/2022-01/13/2023: Cycle 8 of Pembrolizumab   --On 03/03/2023, underwent Cystectomy/Prostatectomy showed infiltrative high-grade urothelial carcinoma, size 5.8 cm involving bladder dome, anterior wall and prostatic stroma. Tumor invades directly into prostatic stroma at apex and mid portion of the  gland (pT4a). Metastatic urothelial cancer in one of two left common iliac lymph nodes.  -- Given the results of the pathology showing positive margins and spread to the lymph node we are considering radiation therapy versus chemotherapy.  For chemotherapy, recommend gemcitabine  and cisplatin x 4 cycles.  This would consist of gemcitabine  1000 mg/m on days 1 and 8 and cisplatin 70 mg/m on day 1 of 21-day cycle for 4 cycles. -- Due to kidney dysfunction and anemia, chemotherapy was deferred and patient underwent adjuvant radiation to the surgical bed from 06/19/2023 until 07/27/2023.  He received 50.4 Gray in 28 fractions. PLAN:  --Labs from today show white blood cell 4.9, hemoglobin 9.0, MCV 93.3, platelets 117 --Most recent CT scan on 09/23/2023 showed overt progression of disease with enlarging lymph nodes.  -- Due to persistent anemia and elevated Cr will need to continue to hold chemotherapy  --Plan is to proceed with Enfortumab treatment as he has had progression of disease -- Proceed with PET CT scan next week with plan to start therapy the week after 4 July. --RTC in 2 weeks with labs and follow up    # Skin Rash -- Occurred after receiving blood transfusion, etiology not entirely clear. -- Patient has been applying moisturizer to the skin and has been taking Benadryl . -- Skin is persistently dry and itchy and erythematous. -- Prescribe another steroid taper with 60 mg for 5 days, 40 for 5 days, and 20 for 5 days.  #Anemia --Hemoglobin is 9.0 today. --May be multifactorial due to nutritional deficiency as well as kidney dysfunction. -- Nutritional labs checked today show borderline vitamin B12 deficiency and iron deficiency. -- Patient is currently taking vitamin B12 p.o. daily and ferrous sulfate  every other day. -- today will receive IV monoferric  and B12 IM injections to bolster levels.  #Hypokalemia: --Potassium level is 4.1 today --Patient is currently taking potassium  supplement. Advised to continue  #Flank pain: --Secondary to bilateral nephrostomy. Patient is currently taking tylenol  with minimal improvement.  --Sent prescription of tramadol  50 mg PO q 6 hours as needed.   # AKI -- Currently has bilateral nephrostomies, underwent exchange on 08/07/2023. -- Creatinine 1.92  #Supportive Care -- chemotherapy education complete -- port placed -- zofran  8mg  q8H PRN and compazine  10mg  PO q6H for nausea -- EMLA  cream for port -- no pain medication required at this time.    No orders of the defined types were placed in this encounter.  All questions were answered. The patient knows to call the clinic with any problems, questions or concerns.  I have spent a total of 30 minutes minutes of face-to-face and non-face-to-face time, preparing to see the patient, performing a medically appropriate examination, counseling and educating the patient, documenting clinical information in the electronic health record,and care coordination.   Norleen IVAR Kidney, MD Department of Hematology/Oncology [facility] Phone: [phone] Pager: [phone] Email: [email or web address]

## 2023-10-22 ENCOUNTER — Encounter: Payer: Self-pay | Admitting: Hematology and Oncology

## 2023-10-22 NOTE — Progress Notes (Signed)
 DISCONTINUE ON PATHWAY REGIMEN - Bladder     A cycle is every 21 days:     Gemcitabine       Cisplatin   **Always confirm dose/schedule in your pharmacy ordering system**  PRIOR TREATMENT: [id number]: Gemcitabine  1,000 mg/m2 D1, 8  + Cisplatin 70 mg/m2 D1 q21 Days x 3 - 4 Cycles  START OFF PATHWAY REGIMEN - Bladder   [id number]:Enfortumab vedotin 1.25 mg/kg IV D1,8,15 q28 Days:   A cycle is every 28 days:     Enfortumab vedotin-ejfv   **Always confirm dose/schedule in your pharmacy ordering system**  Patient Characteristics: Advanced/Metastatic Disease, First Line Therapeutic Status: Advanced/Metastatic Disease Line of Therapy: First Line Intent of Therapy: Non-Curative / Palliative Intent, Discussed with Patient

## 2023-10-23 ENCOUNTER — Encounter: Payer: Self-pay | Admitting: Hematology and Oncology

## 2023-10-24 ENCOUNTER — Telehealth: Payer: Self-pay | Admitting: Radiation Oncology

## 2023-10-24 NOTE — Telephone Encounter (Signed)
 Spoke to pt to offer consult and SIM with Dr. Patrcia. Pt advised he had consult with Dr. Federico 6/27 and he advised XRT not recommended. Pt did not schedule consult at this time. I advised I would reach out to team at [facility] to make sure Dr. Alvaro is in agreement for this before closing referral.

## 2023-10-25 ENCOUNTER — Encounter: Payer: Self-pay | Admitting: Hematology and Oncology

## 2023-10-25 ENCOUNTER — Encounter (HOSPITAL_COMMUNITY)
Admission: RE | Admit: 2023-10-25 | Discharge: 2023-10-25 | Disposition: A | Discharge status: Home / Self Care | Source: Ambulatory Visit | Attending: Hematology and Oncology | Admitting: Hematology and Oncology

## 2023-10-25 DIAGNOSIS — C679 Malignant neoplasm of bladder, unspecified: Secondary | ICD-10-CM | POA: Insufficient documentation

## 2023-10-25 DIAGNOSIS — R911 Solitary pulmonary nodule: Secondary | ICD-10-CM | POA: Diagnosis not present

## 2023-10-25 LAB — GLUCOSE, CAPILLARY: Glucose-Capillary: 81 mg/dL (ref 70–99)

## 2023-10-25 MED ORDER — FLUDEOXYGLUCOSE F - 18 (FDG) INJECTION
13.0900 | Freq: Once | INTRAVENOUS | Status: AC | PRN
  Administered 2023-10-25: 13.09 via INTRAVENOUS

## 2023-10-26 ENCOUNTER — Other Ambulatory Visit: Payer: Self-pay | Admitting: Hematology and Oncology

## 2023-10-26 ENCOUNTER — Telehealth: Payer: Self-pay | Admitting: Hematology and Oncology

## 2023-10-26 DIAGNOSIS — C679 Malignant neoplasm of bladder, unspecified: Secondary | ICD-10-CM

## 2023-10-26 NOTE — Telephone Encounter (Signed)
 I informed Luis [name] that he will not need Chemo education as he has completed that in January. I also spoke with Dr. Federico to confirm that he does not need his lab appointment scheduled 2 days prior to treatment. I rescheduled [name]'s lab appointment for 7/10. Luis [name] is aware of all updated appointment details.

## 2023-10-27 ENCOUNTER — Other Ambulatory Visit: Payer: Self-pay | Admitting: Family Medicine

## 2023-10-27 DIAGNOSIS — F411 Generalized anxiety disorder: Secondary | ICD-10-CM

## 2023-10-31 ENCOUNTER — Other Ambulatory Visit

## 2023-11-02 ENCOUNTER — Inpatient Hospital Stay

## 2023-11-02 ENCOUNTER — Inpatient Hospital Stay: Attending: Hematology and Oncology

## 2023-11-02 ENCOUNTER — Inpatient Hospital Stay (HOSPITAL_BASED_OUTPATIENT_CLINIC_OR_DEPARTMENT_OTHER): Attending: Hematology and Oncology | Admitting: Hematology and Oncology

## 2023-11-02 VITALS — BP 152/79 | HR 62 | Temp 98.1°F | Resp 13 | Wt 263.7 lb

## 2023-11-02 VITALS — BP 138/68 | HR 53 | Resp 16

## 2023-11-02 DIAGNOSIS — Z95828 Presence of other vascular implants and grafts: Secondary | ICD-10-CM

## 2023-11-02 DIAGNOSIS — R7989 Other specified abnormal findings of blood chemistry: Secondary | ICD-10-CM | POA: Diagnosis not present

## 2023-11-02 DIAGNOSIS — C673 Malignant neoplasm of anterior wall of bladder: Secondary | ICD-10-CM | POA: Insufficient documentation

## 2023-11-02 DIAGNOSIS — C671 Malignant neoplasm of dome of bladder: Secondary | ICD-10-CM | POA: Diagnosis not present

## 2023-11-02 DIAGNOSIS — C679 Malignant neoplasm of bladder, unspecified: Secondary | ICD-10-CM

## 2023-11-02 DIAGNOSIS — C775 Secondary and unspecified malignant neoplasm of intrapelvic lymph nodes: Secondary | ICD-10-CM | POA: Insufficient documentation

## 2023-11-02 DIAGNOSIS — Z5112 Encounter for antineoplastic immunotherapy: Secondary | ICD-10-CM | POA: Insufficient documentation

## 2023-11-02 DIAGNOSIS — Z79899 Other long term (current) drug therapy: Secondary | ICD-10-CM | POA: Insufficient documentation

## 2023-11-02 LAB — CBC WITH DIFFERENTIAL (CANCER CENTER ONLY)
Abs Immature Granulocytes: 0.03 K/uL (ref 0.00–0.07)
Basophils Absolute: 0 K/uL (ref 0.0–0.1)
Basophils Relative: 1 %
Eosinophils Absolute: 0.3 K/uL (ref 0.0–0.5)
Eosinophils Relative: 5 %
HCT: 30.1 % — ABNORMAL LOW (ref 39.0–52.0)
Hemoglobin: 10 g/dL — ABNORMAL LOW (ref 13.0–17.0)
Immature Granulocytes: 1 %
Lymphocytes Relative: 11 %
Lymphs Abs: 0.6 K/uL — ABNORMAL LOW (ref 0.7–4.0)
MCH: 30.6 pg (ref 26.0–34.0)
MCHC: 33.2 g/dL (ref 30.0–36.0)
MCV: 92 fL (ref 80.0–100.0)
Monocytes Absolute: 0.3 K/uL (ref 0.1–1.0)
Monocytes Relative: 6 %
Neutro Abs: 4.4 K/uL (ref 1.7–7.7)
Neutrophils Relative %: 76 %
Platelet Count: 162 K/uL (ref 150–400)
RBC: 3.27 MIL/uL — ABNORMAL LOW (ref 4.22–5.81)
RDW: 15.4 % (ref 11.5–15.5)
WBC Count: 5.8 K/uL (ref 4.0–10.5)
nRBC: 0 % (ref 0.0–0.2)

## 2023-11-02 LAB — CMP (CANCER CENTER ONLY)
ALT: 10 U/L (ref 0–44)
AST: 13 U/L — ABNORMAL LOW (ref 15–41)
Albumin: 3.8 g/dL (ref 3.5–5.0)
Alkaline Phosphatase: 88 U/L (ref 38–126)
Anion gap: 7 (ref 5–15)
BUN: 29 mg/dL — ABNORMAL HIGH (ref 8–23)
CO2: 27 mmol/L (ref 22–32)
Calcium: 8.8 mg/dL — ABNORMAL LOW (ref 8.9–10.3)
Chloride: 105 mmol/L (ref 98–111)
Creatinine: 1.87 mg/dL — ABNORMAL HIGH (ref 0.61–1.24)
GFR, Estimated: 38 mL/min — ABNORMAL LOW (ref >60)
Glucose, Bld: 152 mg/dL — ABNORMAL HIGH (ref 70–99)
Potassium: 4 mmol/L (ref 3.5–5.1)
Sodium: 139 mmol/L (ref 135–145)
Total Bilirubin: 0.5 mg/dL (ref 0.0–1.2)
Total Protein: 7.5 g/dL (ref 6.5–8.1)

## 2023-11-02 MED ORDER — PROCHLORPERAZINE MALEATE 10 MG PO TABS
10.0000 mg | ORAL_TABLET | Freq: Once | ORAL | Status: AC
  Administered 2023-11-02: 10 mg via ORAL
  Filled 2023-11-02: qty 1

## 2023-11-02 MED ORDER — SODIUM CHLORIDE 0.9% FLUSH
10.0000 mL | Freq: Once | INTRAVENOUS | Status: AC
  Administered 2023-11-02: 10 mL

## 2023-11-02 MED ORDER — HEPARIN SOD (PORK) LOCK FLUSH 100 UNIT/ML IV SOLN: 500.0000 [IU] | Freq: Once | INTRAVENOUS | Status: DC | PRN

## 2023-11-02 MED ORDER — CYANOCOBALAMIN 1000 MCG/ML IJ SOLN: 1000.0000 ug | Freq: Once | INTRAMUSCULAR | Status: DC

## 2023-11-02 MED ORDER — SODIUM CHLORIDE 0.9 % IV SOLN: INTRAVENOUS | Status: DC

## 2023-11-02 MED ORDER — SODIUM CHLORIDE 0.9% FLUSH: 10.0000 mL | INTRAVENOUS | Status: DC | PRN

## 2023-11-02 MED ORDER — SODIUM CHLORIDE 0.9 % IV SOLN
125.0000 mg | Freq: Once | INTRAVENOUS | Status: AC
  Administered 2023-11-02: 125 mg via INTRAVENOUS
  Filled 2023-11-02: qty 9

## 2023-11-02 NOTE — Progress Notes (Signed)
 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 Federico  08/12/2022: Cycle 1 of Pembrolizumab  09/02/2022: Cycle 2 of Pembrolizumab   09/23/2022: Cycle 3 of Pembrolizumab  10/14/2022: Cycle 4 of Pembrolizumab  11/04/2022: Cycle 5 of Pembrolizumab  11/25/2022: Cycle 6 of Pembrolizumab  12/20/2022: Cycle 7 of Pembrolizumab  01/13/2023: Cycle 8 of Pembrolizumab   03/03/2023: Cystectomy/Prostatectomy showed infiltrative high-grade urothelial carcinoma, size 5.8 cm involving bladder dome, anterior wall and prostatic stroma. Tumor invades directly into prostatic stroma at apex and mid portion of the gland (pT4a). Metastatic urothelial cancer in one of two left common iliac lymph nodes.  06/19/2023-07/27/2023: Received adjuvant radiation to surgical bed.  He received 50.4 Gray in 28 fractions. 11/02/2023: Cycle 1 Day 1 of Enfortumab.   CHIEF COMPLAINTS/PURPOSE OF CONSULTATION:  High Risk Non-Muscle Invasive Bladder Cancer   HISTORY OF PRESENTING ILLNESS:  Luis [name] 68 y.o. male with medical history significant for BCG unresponsive high risk nonmuscle invasive bladder cancer  who presents for a follow up visit.  He was last seen on 10/22/2023.  He presents today to start Enfortumab treatment.   On exam today Luis [name] reports he has been well overall since our last visit.  He reports that he has had no bleeding from his nephrostomy tubes.  He reports he has had some occasional bouts of the back leaking and it has been occasionally painful.  He reports that his appetite has been good and he is eating well.  He is not having any nausea, vomiting, or diarrhea.  He reports that his weight has been steady.  He reports nothing else out of the ordinary.  Overall he is really unable to proceed with Enfortumab therapy today.  MEDICAL HISTORY:  Past Medical History:  Diagnosis Date   Anxiety    takes Valium  as needed   Aortic stenosis, moderate    Arthritis    Ascending aortic aneurysm (HCC)    CAD (coronary artery disease)    a. s/p PCI of the RCA 8/12 with DES by Dr Wonda, preserved EF. b. LHC/RHC (2/16) with mean RA 12, PA 32/15, mean PCWP 18, CI 3.47; patent mid and distal RCA stents, 50-60% proximal stenosis small PDA.      Cancer [facility])    bladder   Carotid stenosis    a. Carotid US  (05/2013):  Bilateral 1-39% ICA; L thyroid  nodule (prior hx of aspiration).   Chronic diastolic CHF (congestive heart failure) (HCC)    Complication of anesthesia    difficulty waking up after gallbladder surgery   Depression    Dyspnea    Essential hypertension    GERD (gastroesophageal reflux disease)    if needed will take OTC meds    Heart murmur    History of colonic polyps    hyperplastic   Hyperlipidemia  Joint pain    Lesion of bladder    Myocardial infarction (HCC) 2012   Obesity (BMI 30-39.9) 02/29/2016   Pre-diabetes    Restless leg    Sleep apnea    uses cpap   Tubular adenoma of colon    Vertigo    takes Meclizine  as needed    SURGICAL HISTORY: Past Surgical History:  Procedure Laterality Date   AORTIC VALVE REPLACEMENT N/A 09/16/2021   Procedure:  AORTIC VALVE REPLACEMENT (AVR);  Surgeon: Lucas Dorise POUR, MD;  Location: [facility] OR;  Service: Open Heart Surgery;  Laterality: N/A;   CATARACT EXTRACTION   4 YRS AGO   BOTH EYES   CHOLECYSTECTOMY  07/21/2011   Procedure: LAPAROSCOPIC CHOLECYSTECTOMY WITH INTRAOPERATIVE CHOLANGIOGRAM;  Surgeon: Alm VEAR Angle, MD;  Location: [facility] ORS;  Service: General;  Laterality: N/A;   CORONARY ANGIOPLASTY  2012   2 stents   coronary stenting     s/p PCI of the RCA by Dr Wonda 8/12 with 2 promus stents   CYSTOSCOPY W/ RETROGRADES Bilateral 12/13/2019   Procedure: CYSTOSCOPY WITH RETROGRADE PYELOGRAM;  Surgeon: Alvaro Hummer, MD;  Location: [facility];  Service: Urology;  Laterality: Bilateral;   CYSTOSCOPY W/ RETROGRADES Bilateral 10/14/2020   Procedure: CYSTOSCOPY WITH RETROGRADE PYELOGRAM;  Surgeon: Alvaro Hummer, MD;  Location[facility];  Service: Urology;  Laterality: Bilateral;   CYSTOSCOPY W/ RETROGRADES Bilateral 12/16/2020   Procedure: CYSTOSCOPY WITH RETROGRADE PYELOGRAM;  Surgeon: Alvaro Hummer, MD;  Location: [facility];  Service: Urology;  Laterality: Bilateral;   CYSTOSCOPY W/ RETROGRADES Bilateral 06/03/2022   Procedure: CYSTOSCOPY WITH RETROGRADE PYELOGRAM;  Surgeon: Alvaro Hummer, MD;  Location: [facility];  Service: Urology;  Laterality: Bilateral;   CYSTOSCOPY W/ RETROGRADES Bilateral 12/28/2022   Procedure: CYSTOSCOPY WITH RETROGRADE PYELOGRAM, FULGARATION OF BLEEDERS;  Surgeon: Alvaro Hummer KATHEE Mickey., MD;  Location: [facility];  Service: Urology;  Laterality: Bilateral;   CYSTOSCOPY WITH INJECTION N/A 03/03/2023   Procedure: CYSTOSCOPY WITH INDOCYANINE INJECTION;  Surgeon: Alvaro Hummer KATHEE Mickey., MD;  Location: [facility];  Service: Urology;  Laterality: N/A;  360 MINUTES   ESOPHAGOGASTRODUODENOSCOPY N/A 06/28/2023   Procedure: EGD (ESOPHAGOGASTRODUODENOSCOPY);  Surgeon: Charlanne Groom, MD;  Location: [facility];  Service: Gastroenterology;  Laterality: N/A;    IR IMAGING GUIDED PORT INSERTION  05/29/2023   IR NEPHROSTOMY EXCHANGE LEFT  08/07/2023   IR NEPHROSTOMY EXCHANGE LEFT  10/02/2023   IR NEPHROSTOMY PLACEMENT RIGHT  07/01/2023   IR RADIOLOGY PERIPHERAL GUIDED IV START  07/01/2023   IR US  GUIDE VASC ACCESS LEFT  07/01/2023   LEFT AND RIGHT HEART CATHETERIZATION WITH CORONARY ANGIOGRAM N/A 06/23/2014   Procedure: LEFT AND RIGHT HEART CATHETERIZATION WITH CORONARY ANGIOGRAM;  Surgeon: Ezra GORMAN Shuck, MD;  Location: [facility] CATH LAB;  Service: Cardiovascular;  Laterality: N/A;   NECK SURGERY  03/23/09   per Dr. Barbarann, cervical fusion    PERICARDIOCENTESIS N/A 09/28/2021   Procedure: PERICARDIOCENTESIS;  Surgeon: Dann Candyce GORMAN, MD;  Location: [facility] INVASIVE CV LAB;  Service: Cardiovascular;  Laterality: N/A;   REPLACEMENT ASCENDING AORTA N/A 09/16/2021   Procedure: REPLACEMENT ASCENDING AORTA WITH 30 X HEMASHIELD PLATINUM WOVEN DOUBLE VELOUR VASCULAR GRAFT;  Surgeon: Lucas Dorise POUR, MD;  Location: [facility] OR;  Service: Open Heart Surgery;  Laterality: N/A;  CIRC ARREST   right elbow surgery     RIGHT HEART CATH N/A 06/15/2023   Procedure: RIGHT HEART CATH;  Surgeon: Shuck Ezra GORMAN, MD;  Location: [facility] INVASIVE CV  LAB;  Service: Cardiovascular;  Laterality: N/A;   RIGHT HEART CATH AND CORONARY ANGIOGRAPHY N/A 07/08/2021   Procedure: RIGHT HEART CATH AND CORONARY ANGIOGRAPHY;  Surgeon: Rolan Ezra RAMAN, MD;  Location: [facility] INVASIVE CV LAB;  Service: Cardiovascular;  Laterality: N/A;   ROBOT ASSISTED LAPAROSCOPIC COMPLETE CYSTECT ILEAL CONDUIT N/A 03/03/2023   Procedure: XI ROBOTIC ASSISTED LAPAROSCOPIC COMPLETE CYSTECTECTOMY WITH  ILEAL CONDUIT DIVERSION;  Surgeon: Alvaro Ricardo KATHEE Mickey., MD;  Location: [facility];  Service: Urology;  Laterality: N/A;   ROBOT ASSISTED LAPAROSCOPIC RADICAL PROSTATECTOMY N/A 03/03/2023   Procedure: XI ROBOTIC ASSISTED LAPAROSCOPIC RADICAL PROSTATECTOMY WITH LYMPH NODE DISSECTION;  Surgeon: Alvaro Ricardo KATHEE Mickey., MD;  Location: [facility];   Service: Urology;  Laterality: N/A;   solonscopy  05/23/08   per Dr. Jakie inch hemorrhoids only, repeat in 5 years   SUBXYPHOID PERICARDIAL WINDOW N/A 09/28/2021   Procedure: SUBXYPHOID PERICARDIAL WINDOW;  Surgeon: Lucas Dorise POUR, MD;  Location: [facility] OR;  Service: Thoracic;  Laterality: N/A;   TEE WITHOUT CARDIOVERSION N/A 06/23/2014   Procedure: TRANSESOPHAGEAL ECHOCARDIOGRAM (TEE);  Surgeon: Ezra RAMAN Rolan, MD;  Location: [facility] ENDOSCOPY;  Service: Cardiovascular;  Laterality: N/A;   TEE WITHOUT CARDIOVERSION N/A 01/21/2016   Procedure: TRANSESOPHAGEAL ECHOCARDIOGRAM (TEE);  Surgeon: Ezra RAMAN Rolan, MD;  Location: [facility] ENDOSCOPY;  Service: Cardiovascular;  Laterality: N/A;   TEE WITHOUT CARDIOVERSION N/A 09/16/2021   Procedure: TRANSESOPHAGEAL ECHOCARDIOGRAM (TEE);  Surgeon: Lucas Dorise POUR, MD;  Location: [facility] OR;  Service: Open Heart Surgery;  Laterality: N/A;   TEE WITHOUT CARDIOVERSION N/A 09/28/2021   Procedure: TRANSESOPHAGEAL ECHOCARDIOGRAM (TEE);  Surgeon: Lucas Dorise POUR, MD;  Location: [facility] OR;  Service: Thoracic;  Laterality: N/A;   TONSILLECTOMY     TRANSESOPHAGEAL ECHOCARDIOGRAM (CATH LAB) N/A 04/17/2023   Procedure: TRANSESOPHAGEAL ECHOCARDIOGRAM;  Surgeon: Jeffrie Oneil BROCKS, MD;  Location: [facility] INVASIVE CV LAB;  Service: Cardiovascular;  Laterality: N/A;   TRANSURETHRAL RESECTION OF BLADDER TUMOR N/A 10/21/2019   Procedure: TRANSURETHRAL RESECTION OF BLADDER TUMOR (TURBT);  Surgeon: Ottelin, Mark, MD;  Location: [facility];  Service: Urology;  Laterality: N/A;   TRANSURETHRAL RESECTION OF BLADDER TUMOR N/A 12/13/2019   Procedure: TRANSURETHRAL RESECTION OF BLADDER TUMOR (TURBT);  Surgeon: Alvaro Ricardo, MD;  Location: [facility];  Service: Urology;  Laterality: N/A;  1 HR   TRANSURETHRAL RESECTION OF BLADDER TUMOR N/A 10/14/2020   Procedure: TRANSURETHRAL RESECTION OF BLADDER TUMOR (TURBT);  Surgeon: Alvaro Ricardo, MD;  Location: [facility];   Service: Urology;  Laterality: N/A;   TRANSURETHRAL RESECTION OF BLADDER TUMOR N/A 12/16/2020   Procedure: RESTAGING TRANSURETHRAL RESECTION OF BLADDER TUMOR (TURBT);  Surgeon: Alvaro Ricardo, MD;  Location: [facility];  Service: Urology;  Laterality: N/A;   TRANSURETHRAL RESECTION OF BLADDER TUMOR N/A 06/03/2022   Procedure: TRANSURETHRAL RESECTION OF BLADDER TUMOR (TURBT);  Surgeon: Alvaro Ricardo, MD;  Location: [facility];  Service: Urology;  Laterality: N/A;   UMBILICAL HERNIA REPAIR  03/03/2023   Procedure: HERNIA REPAIR UMBILICAL;  Surgeon: Alvaro Ricardo KATHEE Mickey., MD;  Location: [facility];  Service: Urology;;    SOCIAL HISTORY: Social History   Socioeconomic History   Marital status: Married    Spouse name: Not on file   Number of children: 4   Years of education: Not on file   Highest education level: Not on file  Occupational History   Occupation: Manufacturing engineer    Employer: [employer]  Tobacco Use   Smoking status: Former  Current packs/day: 0.00    Average packs/day: 2.0 packs/day for 40.0 years (80.0 ttl pk-yrs)    Types: Cigarettes    Start date: 45    Quit date: 2012    Years since quitting: 13.5    Passive exposure: Never   Smokeless tobacco: Never  Vaping Use   Vaping status: Never Used  Substance and Sexual Activity   Alcohol use: No    Alcohol/week: 0.0 standard drinks of alcohol   Drug use: No   Sexual activity: Not on file  Other Topics Concern   Not on file  Social History Narrative   Not on file   Social Drivers of Health   Financial Resource Strain: Low Risk  (11/03/2022)   Overall Financial Resource Strain (CARDIA)    Difficulty of Paying Living Expenses: Not hard at all  Food Insecurity: No Food Insecurity (07/04/2023)   Hunger Vital Sign    Worried About Running Out of Food in the Last Year: Never true    Ran Out of Food in the Last Year: Never true  Transportation Needs: No Transportation Needs (07/04/2023)    PRAPARE - Administrator, Civil Service (Medical): No    Lack of Transportation (Non-Medical): No  Physical Activity: Unknown (11/03/2022)   Exercise Vital Sign    Days of Exercise per Week: Patient unable to answer    Minutes of Exercise per Session: 30 min  Stress: No Stress Concern Present (11/03/2022)   [employer] of Occupational Health - Occupational Stress Questionnaire    Feeling of Stress : Not at all  Social Connections: Moderately Isolated (06/27/2023)   Social Connection and Isolation Panel    Frequency of Communication with Friends and Family: More than three times a week    Frequency of Social Gatherings with Friends and Family: More than three times a week    Attends Religious Services: Never    Database administrator or Organizations: No    Attends Banker Meetings: Never    Marital Status: Married  Catering manager Violence: Not At Risk (07/04/2023)   Humiliation, Afraid, Rape, and Kick questionnaire    Fear of Current or Ex-Partner: No    Emotionally Abused: No    Physically Abused: No    Sexually Abused: No    FAMILY HISTORY: Family History  Problem Relation Age of Onset   Lung cancer Mother        lung   Esophageal cancer Cousin    Colon cancer Neg Hx    Rectal cancer Neg Hx    Stomach cancer Neg Hx     ALLERGIES:  has no known allergies.  MEDICATIONS:  Current Outpatient Medications  Medication Sig Dispense Refill   acetaminophen  (TYLENOL ) 325 MG tablet Take 650 mg by mouth every 6 (six) hours as needed (for pain).     amLODipine  (NORVASC ) 5 MG tablet TAKE 1 TABLET (5 MG TOTAL) BY MOUTH DAILY. 90 tablet 1   aspirin  EC 81 MG tablet Take 81 mg by mouth in the morning.     carvedilol  (COREG ) 3.125 MG tablet TAKE 1 TABLET BY MOUTH TWICE A DAY WITH FOOD 180 tablet 1   cyanocobalamin  (VITAMIN B12) 1000 MCG tablet Take 1 tablet (1,000 mcg total) by mouth daily. 30 tablet 6   diazepam  (VALIUM ) 5 MG tablet TAKE 1 TABLET BY MOUTH  EVERY 12 HOURS AS NEEDED FOR ANXIETY. 60 tablet 5   famotidine  (PEPCID ) 20 MG tablet Take 1 tablet (20 mg total)  by mouth 2 (two) times daily for 6 days. (Patient taking differently: Take 20 mg by mouth 2 (two) times daily. As needed) 12 tablet 0   ferrous sulfate  325 (65 FE) MG tablet Take 1 tablet (325 mg total) by mouth daily with breakfast. Please take with a source of Vitamin C 90 tablet 3   furosemide  (LASIX ) 20 MG tablet Take 1 tablet (20 mg total) by mouth daily. 45 tablet 3   ipratropium (ATROVENT ) 0.03 % nasal spray Place 2 sprays into both nostrils every 12 (twelve) hours as needed for rhinitis.     lidocaine -prilocaine  (EMLA ) cream Apply 1 Application topically as needed. 30 g 0   Magnesium  500 MG TABS Take 500 mg by mouth every morning.     meclizine  (ANTIVERT ) 25 MG tablet Take 1 tablet (25 mg total) by mouth 3 (three) times daily as needed for dizziness. 30 tablet 0   multivitamin-iron-minerals-folic acid  (CENTRUM) chewable tablet Chew 1 tablet by mouth daily.     nitroGLYCERIN  (NITROSTAT ) 0.4 MG SL tablet Place 0.4 mg under the tongue every 5 (five) minutes as needed for chest pain.     ondansetron  (ZOFRAN ) 8 MG tablet Take 1 tablet (8 mg total) by mouth every 8 (eight) hours as needed. 30 tablet 0   pantoprazole  (PROTONIX ) 40 MG tablet Take 1 tablet (40 mg total) by mouth 2 (two) times daily before a meal. 60 tablet 5   potassium chloride  SA (KLOR-CON  M20) 20 MEQ tablet Take 1 tablet (20 mEq total) by mouth daily. (Patient taking differently: Take 20 mEq by mouth 2 (two) times daily.)     prochlorperazine  (COMPAZINE ) 10 MG tablet Take 1 tablet (10 mg total) by mouth every 6 (six) hours as needed for nausea or vomiting. 30 tablet 0   rosuvastatin  (CRESTOR ) 20 MG tablet Take 1 tablet (20 mg total) by mouth daily. 90 tablet 3   traMADol  (ULTRAM ) 50 MG tablet Take 1 tablet (50 mg total) by mouth every 6 (six) hours as needed. 30 tablet 0   venlafaxine  XR (EFFEXOR -XR) 150 MG 24 hr capsule  TAKE 1 CAPSULE BY MOUTH DAILY WITH BREAKFAST. 90 capsule 0   No current facility-administered medications for this visit.    REVIEW OF SYSTEMS:   Constitutional: ( - ) fevers, ( - )  chills , ( - ) night sweats Eyes: ( - ) blurriness of vision, ( - ) double vision, ( - ) watery eyes Ears, nose, mouth, throat, and face: ( - ) mucositis, ( - ) sore throat Respiratory: ( - ) cough, ( - ) dyspnea, ( - ) wheezes Cardiovascular: ( - ) palpitation, ( - ) chest discomfort, ( - ) lower extremity swelling Gastrointestinal:  ( - ) nausea, ( - ) heartburn, ( - ) change in bowel habits Skin: ( - ) abnormal skin rashes Lymphatics: ( - ) new lymphadenopathy, ( - ) easy bruising Neurological: ( - ) numbness, ( - ) tingling, ( - ) new weaknesses Behavioral/Psych: ( - ) mood change, ( - ) new changes  All other systems were reviewed with the patient and are negative.  PHYSICAL EXAMINATION: ECOG PERFORMANCE STATUS: 1 - Symptomatic but completely ambulatory  Vitals:   11/02/23 1128  BP: (!) 152/79  Pulse: 62  Resp: 13  Temp: 98.1 F (36.7 C)  SpO2: 96%     Filed Weights   11/02/23 1128  Weight: 263 lb 11.2 oz (119.6 kg)       GENERAL: well appearing  elderly Caucasian male in NAD  SKIN: skin color, texture, turgor are normal, no rashes or significant lesions EYES: conjunctiva are pink and non-injected, sclera clear LUNGS: clear to auscultation and percussion with normal breathing effort HEART: regular rate & rhythm and no murmurs and no lower extremity edema Musculoskeletal: no cyanosis of digits and no clubbing.  PSYCH: alert & oriented x 3, fluent speech NEURO: no focal motor/sensory deficits  LABORATORY DATA:  I have reviewed the data as listed    Latest Ref Rng & Units 11/02/2023   11:14 AM 10/20/2023    8:15 AM 09/23/2023    2:44 PM  CBC  WBC 4.0 - 10.5 K/uL 5.8  4.9  7.8   Hemoglobin 13.0 - 17.0 g/dL 89.9  9.0  8.3   Hematocrit 39.0 - 52.0 % 30.1  27.9  26.2   Platelets 150  - 400 K/uL 162  117  178        Latest Ref Rng & Units 11/02/2023   11:14 AM 10/20/2023    8:15 AM 09/23/2023    2:44 PM  CMP  Glucose 70 - 99 mg/dL 847  851  873   BUN 8 - 23 mg/dL 29  24  25    Creatinine 0.61 - 1.24 mg/dL 8.12  8.07  8.00   Sodium 135 - 145 mmol/L 139  137  139   Potassium 3.5 - 5.1 mmol/L 4.0  4.3  4.1   Chloride 98 - 111 mmol/L 105  105  105   CO2 22 - 32 mmol/L 27  25  25    Calcium  8.9 - 10.3 mg/dL 8.8  8.6  8.5   Total Protein 6.5 - 8.1 g/dL 7.5  7.1    Total Bilirubin 0.0 - 1.2 mg/dL 0.5  0.5    Alkaline Phos 38 - 126 U/L 88  88    AST 15 - 41 U/L 13  13    ALT 0 - 44 U/L 10  9      RADIOGRAPHIC STUDIES: NM PET Image Initial (PI) Skull Base To Thigh (F-18 FDG) Result Date: 10/26/2023 CLINICAL DATA:  Staging bladder cancer EXAM: NUCLEAR MEDICINE PET SKULL BASE TO THIGH TECHNIQUE: 13.09 mCi F-18 FDG was injected intravenously. Full-ring PET imaging was performed from the skull base to thigh after the radiotracer. CT data was obtained and used for attenuation correction and anatomic localization. Fasting blood glucose: 81 mg/dl COMPARISON:  Renal stone CT 09/23/2023. Chest CT 05/16/2023. Older exams as well. FINDINGS: Mediastinal blood pool activity: SUV max 2.6 Liver activity: SUV max 3.5 NECK: No specific abnormal uptake identified in the neck including along lymph node change of the submandibular, posterior triangle or internal jugular region. Near symmetric uptake of the visualized intracranial compartment. Incidental CT findings: The parotid glands,, submandibular glands unremarkable. There is heterogeneous enlargement of left thyroid  gland but no abnormal uptake. Paranasal sinuses and mastoid air cells are clear. Scattered vascular calcifications. CHEST: No abnormal uptake identified above blood pool in the axillary regions, hilum or mediastinum. No abnormal lung uptake identified this time. Incidental CT findings: Right IJ chest port in place with tip extending to  the SVC right atrial junction region. Gynecomastia. Status post median sternotomy. Heart is not clearly enlarged. No pericardial effusion. Prosthetic aortic valve. Coronary artery calcifications are seen. Graft along the ascending aorta as well. Bovine type aortic arch. Slightly patulous thoracic esophagus. There is some breathing motion. No consolidation, pneumothorax or effusion. There is a new small right lower lobe lung  nodule on series 4, image 88 measuring 5 mm. No abnormal uptake but this is new and recommend close follow-up. ABDOMEN/PELVIS: There is physiologic distribution radiotracer identified along the parenchymal organs, bowel and renal collecting systems. There are however hypermetabolic left para-aortic lymph nodes identified. Two adjacent nodes are seen. Maximum SUV value the more inferior focus has measurement 11.2. This node on image 134 series 4 measures 2.3 by 2.0 cm. Previously 2.4 x 2.2 cm. Node just above this has short axis of 15 mm in long 25 mm on image 129. On the prior this node would have measured 2.0 x 1.8 cm. No additional abnormal nodal uptake identified. Incidental CT findings: On the limited noncontrast attenuation correction CT, grossly the liver has some fatty infiltration. Prior cholecystectomy. Spleen is enlarged with AP length of 14.1 cm. The adrenal glands and pancreas are grossly preserved. Scattered vascular calcifications identified along the aorta and branch vessels. Normal caliber aorta and IVC. Bilateral percutaneous nephrostomy tubes are identified. No collecting system dilatation. Anterior left-sided renal cyst at the lower pole again seen. No ureteral stones. Previous cystoprostatectomy with right lower quadrant ileal loop conduit with ostomy. Surgical changes identified along small bowel loops. Nonspecific presacral fat stranding, slightly increased from previous. Stomach and small bowel are nondilated. Scattered colonic stool. SKELETON: No abnormal uptake along the  visualized osseous structures. Incidental CT findings: Scattered degenerative changes along the spine. Multilevel stenosis. Fixation hardware along the cervical spine. IMPRESSION: As seen on the prior CT scan there are 2 adjacent left periaortic mid retroperitoneal hypermetabolic lymph nodes consistent with metastatic disease. No additional areas of abnormal radiotracer uptake at this time. There is a new small 5 mm right lower lobe lung nodule. Although this is not have any abnormal uptake recommend close follow-up with CT in 3 months. Postop chest with aortic valve replacement and ascending aortic graft. Chest port. Bilateral percutaneous nephrostomy tubes. Findings of cystoprostatectomy and ileal loop conduit. Electronically Signed   By: Ranell Bring M.D.   On: 10/26/2023 17:41     ASSESSMENT & PLAN RUAIRI STUTSMAN 68 y.o. 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  After review of the labs, review of the records, and discussion with the patient the patients findings are most consistent with BCG unresponsive high risk non-muscle invasive bladder cancer, not a candidate for cystectomy.  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 21 days until progression or intolerance up to 24 months. --Received 8 cycles of Pembrolizumab  on 08/12/2022-01/13/2023: Cycle 8 of Pembrolizumab   --On 03/03/2023, underwent Cystectomy/Prostatectomy showed infiltrative high-grade urothelial carcinoma, size 5.8 cm involving bladder dome, anterior wall and prostatic stroma. Tumor invades directly into prostatic stroma at apex and mid portion of the gland (pT4a). Metastatic urothelial cancer in one of two left common iliac lymph nodes.  -- Given the results of the pathology showing positive margins and spread to the lymph node  we are considering radiation therapy versus chemotherapy.  For chemotherapy, recommend gemcitabine  and cisplatin x 4 cycles.  This would consist of gemcitabine  1000 mg/m on days 1 and 8 and cisplatin 70 mg/m on day 1 of 21-day cycle for 4 cycles. -- Due to kidney dysfunction and anemia, chemotherapy was deferred and patient underwent adjuvant radiation to the surgical bed from 06/19/2023 until 07/27/2023.  He received 50.4 Gray in 28 fractions. PLAN:  --  Labs from today show white blood cell 5.8, hemoglobin 10.0, MCV 92, platelets 162.  Creatinine 1.87, LFTs within normal limits --today is Cycle 1 Day 1 of Enfortumab therapy  --RTC in 2 weeks with labs and follow up in 4 weeks for clinic visit   # Skin Rash -- Occurred after receiving blood transfusion, etiology not entirely clear. -- Patient has been applying moisturizer to the skin and has been taking Benadryl . -- Skin is persistently dry and itchy and erythematous. -- Prescribed another steroid taper with 60 mg for 5 days, 40 for 5 days, and 20 for 5 days.  #Anemia --Hemoglobin is 10 --May be multifactorial due to nutritional deficiency as well as kidney dysfunction. -- Nutritional labs checked today show borderline vitamin B12 deficiency and iron deficiency. -- Patient is currently taking vitamin B12 p.o. daily and ferrous sulfate  every other day. -- today will receive IV monoferric  and B12 IM injections to bolster levels.  #Hypokalemia: --Potassium level is 4.0 today --Patient is currently taking potassium supplement. Advised to continue  #Flank pain: --Secondary to bilateral nephrostomy. Patient is currently taking tylenol  with minimal improvement.  --Sent prescription of tramadol  50 mg PO q 6 hours as needed.   # AKI -- Currently has bilateral nephrostomies, underwent exchange on 08/07/2023. -- Creatinine 1.87  #Supportive Care -- chemotherapy education complete -- port placed -- zofran  8mg  q8H PRN and compazine  10mg  PO q6H for  nausea -- EMLA  cream for port -- no pain medication required at this time.    No orders of the defined types were placed in this encounter.  All questions were answered. The patient knows to call the clinic with any problems, questions or concerns.  I have spent a total of 30 minutes minutes of face-to-face and non-face-to-face time, preparing to see the patient, performing a medically appropriate examination, counseling and educating the patient, documenting clinical information in the electronic health record,and care coordination.   Norleen IVAR Kidney, MD Department of Hematology/Oncology [facility] at [facility] Phone: [phone] Pager: [phone] Email: [email or web address]

## 2023-11-02 NOTE — Progress Notes (Signed)
 Pharmacist Chemotherapy Monitoring - Initial Assessment    Anticipated start date: 11/02/23   The following has been reviewed per standard work regarding the patient's treatment regimen: The patient's diagnosis, treatment plan and drug doses, and organ/hematologic function Lab orders and baseline tests specific to treatment regimen  The treatment plan start date, drug sequencing, and pre-medications Prior authorization status  Patient's documented medication list, including drug-drug interaction screen and prescriptions for anti-emetics and supportive care specific to the treatment regimen The drug concentrations, fluid compatibility, administration routes, and timing of the medications to be used The patient's access for treatment and lifetime cumulative dose history, if applicable  The patient's medication allergies and previous infusion related reactions, if applicable   Changes made to treatment plan:  N/A  Follow up needed:  N/A  Dose of Padcev  give at maximum dose of 125 mg for patients >= to 100kg.     Niels FORBES Molt, RPH, 11/02/2023  1:03 PM

## 2023-11-02 NOTE — Patient Instructions (Signed)
 [facility] - A DEPT OF [place]  Discharge Instructions: Thank you for choosing [facility] to provide your oncology and hematology care.   If you have a lab appointment with the Cancer Center, please go directly to the Cancer Center and check in at the registration area.   Wear comfortable clothing and clothing appropriate for easy access to any Portacath or PICC line.   We strive to give you quality time with your provider. You may need to reschedule your appointment if you arrive late (15 or more minutes).  Arriving late affects you and other patients whose appointments are after yours.  Also, if you miss three or more appointments without notifying the office, you may be dismissed from the clinic at the provider's discretion.      For prescription refill requests, have your pharmacy contact our office and allow 72 hours for refills to be completed.    Today you received the following chemotherapy and/or immunotherapy agents padcev       To help prevent nausea and vomiting after your treatment, we encourage you to take your nausea medication as directed.  BELOW ARE SYMPTOMS THAT SHOULD BE REPORTED IMMEDIATELY: *FEVER GREATER THAN 100.4 F (38 C) OR HIGHER *CHILLS OR SWEATING *NAUSEA AND VOMITING THAT IS NOT CONTROLLED WITH YOUR NAUSEA MEDICATION *UNUSUAL SHORTNESS OF BREATH *UNUSUAL BRUISING OR BLEEDING *URINARY PROBLEMS (pain or burning when urinating, or frequent urination) *BOWEL PROBLEMS (unusual diarrhea, constipation, pain near the anus) TENDERNESS IN MOUTH AND THROAT WITH OR WITHOUT PRESENCE OF ULCERS (sore throat, sores in mouth, or a toothache) UNUSUAL RASH, SWELLING OR PAIN  UNUSUAL VAGINAL DISCHARGE OR ITCHING   Items with * indicate a potential emergency and should be followed up as soon as possible or go to the Emergency Department if any problems should occur.  Please show the CHEMOTHERAPY ALERT CARD or IMMUNOTHERAPY  ALERT CARD at check-in to the Emergency Department and triage nurse.  Should you have questions after your visit or need to cancel or reschedule your appointment, please contact [facility]  Dept: [phone]  and follow the prompts.  Office hours are 8:00 a.m. to 4:30 p.m. Monday - Friday. Please note that voicemails left after 4:00 p.m. may not be returned until the following business day.  We are closed weekends and major holidays. You have access to a nurse at all times for urgent questions. Please call the main number to the clinic Dept: [phone] and follow the prompts.   For any non-urgent questions, you may also contact your provider using MyChart. We now offer e-Visits for anyone 71 and older to request care online for non-urgent symptoms. For details visit [email or web address].   Also download the MyChart app! Go to the app store, search MyChart, open the app, select , and log in with your MyChart username and password.

## 2023-11-05 ENCOUNTER — Encounter: Payer: Self-pay | Admitting: Hematology and Oncology

## 2023-11-09 ENCOUNTER — Other Ambulatory Visit

## 2023-11-09 ENCOUNTER — Ambulatory Visit: Admitting: Hematology and Oncology

## 2023-11-09 ENCOUNTER — Other Ambulatory Visit: Payer: Self-pay | Admitting: Hematology and Oncology

## 2023-11-09 ENCOUNTER — Inpatient Hospital Stay

## 2023-11-09 ENCOUNTER — Ambulatory Visit

## 2023-11-09 VITALS — BP 150/61 | HR 68 | Temp 98.3°F | Resp 16 | Wt 264.0 lb

## 2023-11-09 DIAGNOSIS — C679 Malignant neoplasm of bladder, unspecified: Secondary | ICD-10-CM

## 2023-11-09 DIAGNOSIS — Z95828 Presence of other vascular implants and grafts: Secondary | ICD-10-CM

## 2023-11-09 DIAGNOSIS — Z5112 Encounter for antineoplastic immunotherapy: Secondary | ICD-10-CM | POA: Diagnosis not present

## 2023-11-09 LAB — CBC WITH DIFFERENTIAL (CANCER CENTER ONLY)
Abs Immature Granulocytes: 0.03 K/uL (ref 0.00–0.07)
Basophils Absolute: 0 K/uL (ref 0.0–0.1)
Basophils Relative: 0 %
Eosinophils Absolute: 0.2 K/uL (ref 0.0–0.5)
Eosinophils Relative: 4 %
HCT: 29.6 % — ABNORMAL LOW (ref 39.0–52.0)
Hemoglobin: 10.1 g/dL — ABNORMAL LOW (ref 13.0–17.0)
Immature Granulocytes: 1 %
Lymphocytes Relative: 11 %
Lymphs Abs: 0.7 K/uL (ref 0.7–4.0)
MCH: 30.6 pg (ref 26.0–34.0)
MCHC: 34.1 g/dL (ref 30.0–36.0)
MCV: 89.7 fL (ref 80.0–100.0)
Monocytes Absolute: 0.7 K/uL (ref 0.1–1.0)
Monocytes Relative: 10 %
Neutro Abs: 4.9 K/uL (ref 1.7–7.7)
Neutrophils Relative %: 74 %
Platelet Count: 165 K/uL (ref 150–400)
RBC: 3.3 MIL/uL — ABNORMAL LOW (ref 4.22–5.81)
RDW: 15 % (ref 11.5–15.5)
WBC Count: 6.6 K/uL (ref 4.0–10.5)
nRBC: 0 % (ref 0.0–0.2)

## 2023-11-09 LAB — CMP (CANCER CENTER ONLY)
ALT: 14 U/L (ref 0–44)
AST: 19 U/L (ref 15–41)
Albumin: 3.8 g/dL (ref 3.5–5.0)
Alkaline Phosphatase: 97 U/L (ref 38–126)
Anion gap: 7 (ref 5–15)
BUN: 22 mg/dL (ref 8–23)
CO2: 27 mmol/L (ref 22–32)
Calcium: 8.7 mg/dL — ABNORMAL LOW (ref 8.9–10.3)
Chloride: 105 mmol/L (ref 98–111)
Creatinine: 1.83 mg/dL — ABNORMAL HIGH (ref 0.61–1.24)
GFR, Estimated: 39 mL/min — ABNORMAL LOW (ref >60)
Glucose, Bld: 171 mg/dL — ABNORMAL HIGH (ref 70–99)
Potassium: 4.2 mmol/L (ref 3.5–5.1)
Sodium: 139 mmol/L (ref 135–145)
Total Bilirubin: 0.6 mg/dL (ref 0.0–1.2)
Total Protein: 7.4 g/dL (ref 6.5–8.1)

## 2023-11-09 MED ORDER — SODIUM CHLORIDE 0.9 % IV SOLN
125.0000 mg | Freq: Once | INTRAVENOUS | Status: AC
  Administered 2023-11-09: 125 mg via INTRAVENOUS
  Filled 2023-11-09: qty 12

## 2023-11-09 MED ORDER — SODIUM CHLORIDE 0.9% FLUSH: 10.0000 mL | INTRAVENOUS | Status: DC | PRN

## 2023-11-09 MED ORDER — CYANOCOBALAMIN 1000 MCG/ML IJ SOLN
1000.0000 ug | Freq: Once | INTRAMUSCULAR | Status: AC
  Administered 2023-11-09: 1000 ug via INTRAMUSCULAR
  Filled 2023-11-09: qty 1

## 2023-11-09 MED ORDER — PROCHLORPERAZINE MALEATE 10 MG PO TABS
10.0000 mg | ORAL_TABLET | Freq: Once | ORAL | Status: AC
  Administered 2023-11-09: 10 mg via ORAL
  Filled 2023-11-09: qty 1

## 2023-11-09 MED ORDER — HEPARIN SOD (PORK) LOCK FLUSH 100 UNIT/ML IV SOLN
500.0000 [IU] | Freq: Once | INTRAVENOUS | Status: DC | PRN
Start: 2023-11-09 — End: 2023-11-09

## 2023-11-09 MED ORDER — SODIUM CHLORIDE 0.9% FLUSH
10.0000 mL | Freq: Once | INTRAVENOUS | Status: AC
Start: 2023-11-09 — End: 2023-11-09
  Administered 2023-11-09: 10 mL

## 2023-11-09 MED ORDER — SODIUM CHLORIDE 0.9 % IV SOLN
INTRAVENOUS | Status: DC
Start: 2023-11-09 — End: 2023-11-09

## 2023-11-09 NOTE — Patient Instructions (Signed)
 [facility] - A DEPT OF [place]  Discharge Instructions: Thank you for choosing [facility] to provide your oncology and hematology care.   If you have a lab appointment with the Cancer Center, please go directly to the Cancer Center and check in at the registration area.   Wear comfortable clothing and clothing appropriate for easy access to any Portacath or PICC line.   We strive to give you quality time with your provider. You may need to reschedule your appointment if you arrive late (15 or more minutes).  Arriving late affects you and other patients whose appointments are after yours.  Also, if you miss three or more appointments without notifying the office, you may be dismissed from the clinic at the provider's discretion.      For prescription refill requests, have your pharmacy contact our office and allow 72 hours for refills to be completed.    Today you received the following chemotherapy and/or immunotherapy agents padcev       To help prevent nausea and vomiting after your treatment, we encourage you to take your nausea medication as directed.  BELOW ARE SYMPTOMS THAT SHOULD BE REPORTED IMMEDIATELY: *FEVER GREATER THAN 100.4 F (38 C) OR HIGHER *CHILLS OR SWEATING *NAUSEA AND VOMITING THAT IS NOT CONTROLLED WITH YOUR NAUSEA MEDICATION *UNUSUAL SHORTNESS OF BREATH *UNUSUAL BRUISING OR BLEEDING *URINARY PROBLEMS (pain or burning when urinating, or frequent urination) *BOWEL PROBLEMS (unusual diarrhea, constipation, pain near the anus) TENDERNESS IN MOUTH AND THROAT WITH OR WITHOUT PRESENCE OF ULCERS (sore throat, sores in mouth, or a toothache) UNUSUAL RASH, SWELLING OR PAIN  UNUSUAL VAGINAL DISCHARGE OR ITCHING   Items with * indicate a potential emergency and should be followed up as soon as possible or go to the Emergency Department if any problems should occur.  Please show the CHEMOTHERAPY ALERT CARD or IMMUNOTHERAPY  ALERT CARD at check-in to the Emergency Department and triage nurse.  Should you have questions after your visit or need to cancel or reschedule your appointment, please contact [facility]  Dept: [phone]  and follow the prompts.  Office hours are 8:00 a.m. to 4:30 p.m. Monday - Friday. Please note that voicemails left after 4:00 p.m. may not be returned until the following business day.  We are closed weekends and major holidays. You have access to a nurse at all times for urgent questions. Please call the main number to the clinic Dept: [phone] and follow the prompts.   For any non-urgent questions, you may also contact your provider using MyChart. We now offer e-Visits for anyone 71 and older to request care online for non-urgent symptoms. For details visit [email or web address].   Also download the MyChart app! Go to the app store, search MyChart, open the app, select , and log in with your MyChart username and password.

## 2023-11-10 ENCOUNTER — Encounter: Payer: Self-pay | Admitting: Hematology and Oncology

## 2023-11-13 ENCOUNTER — Other Ambulatory Visit (HOSPITAL_COMMUNITY)

## 2023-11-14 ENCOUNTER — Telehealth: Payer: Self-pay | Admitting: *Deleted

## 2023-11-14 DIAGNOSIS — G4733 Obstructive sleep apnea (adult) (pediatric): Secondary | ICD-10-CM | POA: Diagnosis not present

## 2023-11-14 NOTE — Telephone Encounter (Signed)
 Medication Prior Authorization Status  Processed CoverMyMeds KEY: [id number] for EMLA  cream  Aproved Today  Per [employer]  PA Case ID: [id number]   Effective 11/14/2023 through 02/12/2024.

## 2023-11-16 ENCOUNTER — Inpatient Hospital Stay: Admitting: Hematology and Oncology

## 2023-11-16 ENCOUNTER — Inpatient Hospital Stay

## 2023-11-16 VITALS — BP 127/68 | HR 64 | Resp 14

## 2023-11-16 VITALS — BP 138/73 | HR 71 | Temp 98.0°F | Resp 14 | Wt 259.8 lb

## 2023-11-16 DIAGNOSIS — R7989 Other specified abnormal findings of blood chemistry: Secondary | ICD-10-CM

## 2023-11-16 DIAGNOSIS — C679 Malignant neoplasm of bladder, unspecified: Secondary | ICD-10-CM

## 2023-11-16 DIAGNOSIS — Z95828 Presence of other vascular implants and grafts: Secondary | ICD-10-CM

## 2023-11-16 DIAGNOSIS — Z5112 Encounter for antineoplastic immunotherapy: Secondary | ICD-10-CM

## 2023-11-16 LAB — CBC WITH DIFFERENTIAL (CANCER CENTER ONLY)
Abs Immature Granulocytes: 0.06 K/uL (ref 0.00–0.07)
Basophils Absolute: 0 K/uL (ref 0.0–0.1)
Basophils Relative: 1 %
Eosinophils Absolute: 0.3 K/uL (ref 0.0–0.5)
Eosinophils Relative: 6 %
HCT: 29.3 % — ABNORMAL LOW (ref 39.0–52.0)
Hemoglobin: 10 g/dL — ABNORMAL LOW (ref 13.0–17.0)
Immature Granulocytes: 1 %
Lymphocytes Relative: 13 %
Lymphs Abs: 0.7 K/uL (ref 0.7–4.0)
MCH: 30.5 pg (ref 26.0–34.0)
MCHC: 34.1 g/dL (ref 30.0–36.0)
MCV: 89.3 fL (ref 80.0–100.0)
Monocytes Absolute: 0.5 K/uL (ref 0.1–1.0)
Monocytes Relative: 10 %
Neutro Abs: 3.4 K/uL (ref 1.7–7.7)
Neutrophils Relative %: 69 %
Platelet Count: 161 K/uL (ref 150–400)
RBC: 3.28 MIL/uL — ABNORMAL LOW (ref 4.22–5.81)
RDW: 15.1 % (ref 11.5–15.5)
WBC Count: 4.9 K/uL (ref 4.0–10.5)
nRBC: 0 % (ref 0.0–0.2)

## 2023-11-16 LAB — CMP (CANCER CENTER ONLY)
ALT: 41 U/L (ref 0–44)
AST: 51 U/L — ABNORMAL HIGH (ref 15–41)
Albumin: 3.7 g/dL (ref 3.5–5.0)
Alkaline Phosphatase: 102 U/L (ref 38–126)
Anion gap: 9 (ref 5–15)
BUN: 14 mg/dL (ref 8–23)
CO2: 28 mmol/L (ref 22–32)
Calcium: 8.9 mg/dL (ref 8.9–10.3)
Chloride: 103 mmol/L (ref 98–111)
Creatinine: 1.87 mg/dL — ABNORMAL HIGH (ref 0.61–1.24)
GFR, Estimated: 38 mL/min — ABNORMAL LOW (ref >60)
Glucose, Bld: 204 mg/dL — ABNORMAL HIGH (ref 70–99)
Potassium: 3.7 mmol/L (ref 3.5–5.1)
Sodium: 140 mmol/L (ref 135–145)
Total Bilirubin: 0.8 mg/dL (ref 0.0–1.2)
Total Protein: 7.3 g/dL (ref 6.5–8.1)

## 2023-11-16 MED ORDER — HEPARIN SOD (PORK) LOCK FLUSH 100 UNIT/ML IV SOLN
500.0000 [IU] | Freq: Once | INTRAVENOUS | Status: AC | PRN
  Administered 2023-11-16: 500 [IU]

## 2023-11-16 MED ORDER — SODIUM CHLORIDE 0.9% FLUSH
10.0000 mL | INTRAVENOUS | Status: DC | PRN
  Administered 2023-11-16: 10 mL

## 2023-11-16 MED ORDER — SODIUM CHLORIDE 0.9 % IV SOLN
INTRAVENOUS | Status: DC
Start: 2023-11-16 — End: 2023-11-16

## 2023-11-16 MED ORDER — SODIUM CHLORIDE 0.9% FLUSH
10.0000 mL | Freq: Once | INTRAVENOUS | Status: AC
  Administered 2023-11-16: 10 mL

## 2023-11-16 MED ORDER — PROCHLORPERAZINE MALEATE 10 MG PO TABS
10.0000 mg | ORAL_TABLET | Freq: Once | ORAL | Status: AC
  Administered 2023-11-16: 10 mg via ORAL
  Filled 2023-11-16: qty 1

## 2023-11-16 MED ORDER — SODIUM CHLORIDE 0.9 % IV SOLN
125.0000 mg | Freq: Once | INTRAVENOUS | Status: AC
  Administered 2023-11-16: 125 mg via INTRAVENOUS
  Filled 2023-11-16: qty 9

## 2023-11-16 NOTE — Patient Instructions (Signed)
 [facility] - A DEPT OF [place]  Discharge Instructions: Thank you for choosing  Cancer Center to provide your oncology and hematology care.   If you have a lab appointment with the Cancer Center, please go directly to the Cancer Center and check in at the registration area.   Wear comfortable clothing and clothing appropriate for easy access to any Portacath or PICC line.   We strive to give you quality time with your provider. You may need to reschedule your appointment if you arrive late (15 or more minutes).  Arriving late affects you and other patients whose appointments are after yours.  Also, if you miss three or more appointments without notifying the office, you may be dismissed from the clinic at the provider's discretion.      For prescription refill requests, have your pharmacy contact our office and allow 72 hours for refills to be completed.    Today you received the following chemotherapy and/or immunotherapy agent: Enfortumumab (Padcev )    To help prevent nausea and vomiting after your treatment, we encourage you to take your nausea medication as directed.  BELOW ARE SYMPTOMS THAT SHOULD BE REPORTED IMMEDIATELY: *FEVER GREATER THAN 100.4 F (38 C) OR HIGHER *CHILLS OR SWEATING *NAUSEA AND VOMITING THAT IS NOT CONTROLLED WITH YOUR NAUSEA MEDICATION *UNUSUAL SHORTNESS OF BREATH *UNUSUAL BRUISING OR BLEEDING *URINARY PROBLEMS (pain or burning when urinating, or frequent urination) *BOWEL PROBLEMS (unusual diarrhea, constipation, pain near the anus) TENDERNESS IN MOUTH AND THROAT WITH OR WITHOUT PRESENCE OF ULCERS (sore throat, sores in mouth, or a toothache) UNUSUAL RASH, SWELLING OR PAIN  UNUSUAL VAGINAL DISCHARGE OR ITCHING   Items with * indicate a potential emergency and should be followed up as soon as possible or go to the Emergency Department if any problems should occur.  Please show the CHEMOTHERAPY ALERT CARD or  IMMUNOTHERAPY ALERT CARD at check-in to the Emergency Department and triage nurse.  Should you have questions after your visit or need to cancel or reschedule your appointment, please contact [facility]  Dept: [phone]  and follow the prompts.  Office hours are 8:00 a.m. to 4:30 p.m. Monday - Friday. Please note that voicemails left after 4:00 p.m. may not be returned until the following business day.  We are closed weekends and major holidays. You have access to a nurse at all times for urgent questions. Please call the main number to the clinic Dept: [phone] and follow the prompts.   For any non-urgent questions, you may also contact your provider using MyChart. We now offer e-Visits for anyone 57 and older to request care online for non-urgent symptoms. For details visit [email or web address].   Also download the MyChart app! Go to the app store, search MyChart, open the app, select [place], and log in with your MyChart username and password.  Enfortumab Vedotin  Injection What is this medication? ENFORTUMAB VEDOTIN  (en FORT ue mab ve DOE tin) treats bladder cancer and kidney cancer. It works by blocking a protein that causes cancer cells to grow and multiply. This helps to slow or stop the spread of cancer cells. This medicine may be used for other purposes; ask your health care provider or pharmacist if you have questions. COMMON BRAND NAME(S): PADCEV  What should I tell my care team before I take this medication? They need to know if you have any of these conditions:  Diabetes Eye disease Liver disease Lung disease Tingling of the fingers or toes or other nerve disorder Vision problems An unusual or allergic reaction to enfortumab vedotin , other medications, foods, dyes, or preservatives Pregnant or trying to get pregnant Breast-feeding How should I use this medication? This medication is injected into a vein. It  is given by your care team in a hospital or clinic setting. Talk to your care team about the use of this medication in children. Special care may be needed. Overdosage: If you think you have taken too much of this medicine contact a poison control center or emergency room at once. NOTE: This medicine is only for you. Do not share this medicine with others. What if I miss a dose? Keep appointments for follow-up doses. It is important not to miss your dose. Call your care team if you are unable to keep an appointment. What may interact with this medication? This medication may affect how other medications work, and other medications may affect how this medication works. Talk with your care team about all of the medications you take. They may suggest changes to your treatment plan to lower the risk of side effects and to make sure your medications work as intended. This list may not describe all possible interactions. Give your health care provider a list of all the medicines, herbs, non-prescription drugs, or dietary supplements you use. Also tell them if you smoke, drink alcohol, or use illegal drugs. Some items may interact with your medicine. What should I watch for while using this medication? Your condition will be monitored carefully while you are receiving this medication. This medication may make you feel generally unwell. This is not uncommon as chemotherapy can affect healthy cells as well as cancer cells. Report any side effects. Continue your course of treatment even though you feel ill unless your care team tells you to stop. This medication may increase blood sugar. The risk may be higher in patients who already have diabetes. Ask your care team what you can do to lower your risk of diabetes while taking this medication. This medication can cause a serious condition in which there is too much acid in your blood. If you develop nausea, vomiting, stomach pain, unusual tiredness, or trouble  breathing, stop taking this medication and call your care team right away. If possible, use a ketone dipstick to check for ketones in your urine. This medication may cause dry eyes and blurred vision. If you wear contact lenses, you may feel some discomfort. Lubricating eye drops may help. See your care team if the problem does not go away or is severe. Tell your care team right away if you have any change in your eyesight. This medication may increase your risk of getting an infection. Call your care team for advice if you get a fever, chills, sore throat, or other symptoms of a cold or flu. Do not treat yourself. Try to avoid being around people who are sick. Avoid taking medications that contain aspirin, acetaminophen , ibuprofen, naproxen, or ketoprofen unless instructed by your care team. These medications may hide a fever. This medication may cause serious skin reactions. They can happen weeks to months after starting the medication. Contact your care team right away if you notice fevers or flu-like symptoms with a rash. The rash may be red or purple and then turn into blisters or peeling of the skin. You may also notice a red rash with swelling of the face, lips or lymph nodes in your  neck or under your arms. Talk to your care team if you or your partner may be pregnant. Serious birth defects can occur if you take this medication during pregnancy and for 2 months after the last dose. You will need a negative pregnancy test before starting this medication. Contraception is recommended while taking this medication and for 2 months after the last dose. Your care team can help you find the option that works for you. If your partner can get pregnant, use a condom during sex while taking this medication and for 4 months after the last dose. Do not breastfeed while taking this medicine or for at least 3 weeks after the last dose. This medication may cause infertility. Talk to your care team if you are  concerned about your fertility. What side effects may I notice from receiving this medication? Side effects that you should report to your care team as soon as possible: Allergic reactions--skin rash, itching, hives, swelling of the face, lips, tongue, or throat Dry cough, shortness of breath or trouble breathing Eye pain, redness, irritation, or discharge with blurry or decreased vision High blood sugar (hyperglycemia)--increased thirst or amount of urine, unusual weakness or fatigue, blurry vision Painful swelling, warmth, or redness of the skin, blisters or sores at the infusion site Pain, tingling, or numbness in the hands or feet Redness, blistering, peeling, or loosening of the skin, including inside the mouth Unusual bruising or bleeding Side effects that usually do not require medical attention (report these to your care team if they continue or are bothersome): Change in taste Diarrhea Dry eyes Fatigue Hair loss Loss of appetite This list may not describe all possible side effects. Call your doctor for medical advice about side effects. You may report side effects to FDA at 1-800-FDA-1088. Where should I keep my medication? This medication is given in a hospital or clinic. It will not be stored at home. NOTE: This sheet is a summary. It may not cover all possible information. If you have questions about this medicine, talk to your doctor, pharmacist, or health care provider.  2024 Elsevier/Gold Standard (2021-08-24 00:00:00)

## 2023-11-16 NOTE — Progress Notes (Signed)
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 treatment and every 2 weeks for clinic visit.  # Skin Rash -- Occurred after receiving blood transfusion, etiology not entirely clear. -- Patient has been applying moisturizer to the skin and has been taking Benadryl . -- Skin is persistently dry and itchy and erythematous. -- Prescribed steroid taper with 60 mg for 5 days, 40 for 5 days, and 20 for 5 days.  #Anemia --Hemoglobin 10.0 --May be multifactorial due to nutritional deficiency as well as kidney dysfunction. -- Nutritional labs checked today show borderline vitamin B12 deficiency and iron deficiency. -- Patient is currently taking vitamin B12 p.o. daily and ferrous sulfate  every other day. -- today will receive IV monoferric  and B12 IM injections to bolster levels.  #Hypokalemia: --Potassium level is 3.7 today --Patient is currently taking potassium supplement. Advised to continue  #Flank pain: --Secondary to bilateral nephrostomy. Patient is currently taking tylenol  with minimal improvement.  --Sent prescription of tramadol  50 mg PO q 6 hours as needed.   # AKI -- Currently has bilateral nephrostomies, underwent exchange on 08/07/2023. -- Creatinine 1.87  #Supportive Care -- chemotherapy  education complete -- port placed -- zofran  8mg  q8H PRN and compazine  10mg  PO q6H for nausea -- EMLA  cream for port -- no pain medication required at this time.    Orders Placed This Encounter  Procedures   CBC with Differential (Cancer Center Only)    Standing Status:   Future    Expected Date:   11/30/2023    Expiration Date:   11/29/2024   CMP (Cancer Center only)    Standing Status:   Future    Expected Date:   11/30/2023    Expiration Date:   11/29/2024   CBC with Differential (Cancer Center Only)    Standing Status:   Future    Expected Date:   12/07/2023    Expiration Date:   12/06/2024   CMP (Cancer Center only)    Standing Status:   Future    Expected Date:   12/07/2023    Expiration Date:   12/06/2024   CBC with Differential (Cancer Center Only)    Standing Status:   Future    Expected Date:   12/14/2023    Expiration Date:   12/13/2024   CMP (Cancer Center only)    Standing Status:   Future    Expected Date:   12/14/2023    Expiration Date:   12/13/2024   CBC with Differential (Cancer Center Only)    Standing Status:   Future    Expected Date:   12/28/2023    Expiration Date:   12/27/2024   CMP (Cancer Center only)    Standing Status:   Future    Expected Date:   12/28/2023    Expiration Date:   12/27/2024   CBC with Differential (Cancer Center Only)    Standing Status:   Future    Expected Date:   01/04/2024    Expiration Date:   01/03/2025   CMP (Cancer Center only)  Standing Status:   Future    Expected Date:   01/04/2024    Expiration Date:   01/03/2025   CBC with Differential (Cancer Center Only)    Standing Status:   Future    Expected Date:   01/11/2024    Expiration Date:   01/10/2025   CMP (Cancer Center only)    Standing Status:   Future    Expected Date:   01/11/2024    Expiration Date:   01/10/2025   All questions were answered. The patient knows to call the clinic with any problems, questions or concerns.  I have spent a total of 30 minutes minutes of  face-to-face and non-face-to-face time, preparing to see the patient, performing a medically appropriate examination, counseling and educating the patient, documenting clinical information in the electronic health record,and care coordination.   Norleen IVAR Kidney, MD Department of Hematology/Oncology [facility] at [facility] Phone: [phone] Pager: [phone] Email: [email or web address]

## 2023-11-17 ENCOUNTER — Other Ambulatory Visit: Payer: Self-pay

## 2023-11-18 ENCOUNTER — Other Ambulatory Visit: Payer: Self-pay

## 2023-11-21 DIAGNOSIS — Z936 Other artificial openings of urinary tract status: Secondary | ICD-10-CM | POA: Diagnosis not present

## 2023-11-21 DIAGNOSIS — Z8551 Personal history of malignant neoplasm of bladder: Secondary | ICD-10-CM | POA: Diagnosis not present

## 2023-11-23 ENCOUNTER — Other Ambulatory Visit: Payer: Self-pay

## 2023-11-25 ENCOUNTER — Other Ambulatory Visit: Payer: Self-pay

## 2023-11-25 ENCOUNTER — Emergency Department (HOSPITAL_COMMUNITY)
Admission: EM | Admit: 2023-11-25 | Discharge: 2023-11-25 | Disposition: A | Discharge status: Home / Self Care | Attending: Emergency Medicine | Admitting: Emergency Medicine

## 2023-11-25 ENCOUNTER — Encounter (HOSPITAL_COMMUNITY): Payer: Self-pay

## 2023-11-25 ENCOUNTER — Emergency Department (HOSPITAL_COMMUNITY)

## 2023-11-25 DIAGNOSIS — Y92410 Unspecified street and highway as the place of occurrence of the external cause: Secondary | ICD-10-CM | POA: Diagnosis not present

## 2023-11-25 DIAGNOSIS — M79631 Pain in right forearm: Secondary | ICD-10-CM | POA: Diagnosis present

## 2023-11-25 DIAGNOSIS — Z23 Encounter for immunization: Secondary | ICD-10-CM | POA: Diagnosis not present

## 2023-11-25 DIAGNOSIS — S51801A Unspecified open wound of right forearm, initial encounter: Secondary | ICD-10-CM | POA: Diagnosis not present

## 2023-11-25 DIAGNOSIS — M79641 Pain in right hand: Secondary | ICD-10-CM | POA: Diagnosis not present

## 2023-11-25 DIAGNOSIS — M7989 Other specified soft tissue disorders: Secondary | ICD-10-CM | POA: Insufficient documentation

## 2023-11-25 DIAGNOSIS — Z7982 Long term (current) use of aspirin: Secondary | ICD-10-CM | POA: Insufficient documentation

## 2023-11-25 DIAGNOSIS — M19031 Primary osteoarthritis, right wrist: Secondary | ICD-10-CM | POA: Diagnosis not present

## 2023-11-25 DIAGNOSIS — Z8551 Personal history of malignant neoplasm of bladder: Secondary | ICD-10-CM | POA: Insufficient documentation

## 2023-11-25 DIAGNOSIS — M19021 Primary osteoarthritis, right elbow: Secondary | ICD-10-CM | POA: Diagnosis not present

## 2023-11-25 MED ORDER — TETANUS-DIPHTH-ACELL PERTUSSIS 5-2.5-18.5 LF-MCG/0.5 IM SUSY
0.5000 mL | PREFILLED_SYRINGE | Freq: Once | INTRAMUSCULAR | Status: AC
  Administered 2023-11-25: 0.5 mL via INTRAMUSCULAR
  Filled 2023-11-25: qty 0.5
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
## 2023-11-25 NOTE — Discharge Instructions (Signed)
 As discussed, your evaluation today has been largely reassuring.  But, it is important that you monitor your condition carefully, and do not hesitate to return to the ED if you develop new, or concerning changes in your condition. ? ?Otherwise, please follow-up with your physician for appropriate ongoing care. ? ?

## 2023-11-27 ENCOUNTER — Other Ambulatory Visit (HOSPITAL_COMMUNITY): Payer: Self-pay | Admitting: Radiology

## 2023-11-27 ENCOUNTER — Ambulatory Visit (HOSPITAL_COMMUNITY)
Admission: RE | Admit: 2023-11-27 | Discharge: 2023-11-27 | Disposition: A | Discharge status: Home / Self Care | Source: Ambulatory Visit | Attending: Radiology | Admitting: Radiology

## 2023-11-27 DIAGNOSIS — Z436 Encounter for attention to other artificial openings of urinary tract: Secondary | ICD-10-CM | POA: Diagnosis not present

## 2023-11-27 DIAGNOSIS — Z936 Other artificial openings of urinary tract status: Secondary | ICD-10-CM

## 2023-11-27 DIAGNOSIS — C679 Malignant neoplasm of bladder, unspecified: Secondary | ICD-10-CM | POA: Insufficient documentation

## 2023-11-27 HISTORY — PX: IR NEPHROSTOMY EXCHANGE LEFT: IMG6069

## 2023-11-27 MED ORDER — LIDOCAINE HCL 1 % IJ SOLN
INTRAMUSCULAR | Status: AC
  Filled 2023-11-27: qty 20

## 2023-11-27 MED ORDER — IOHEXOL 300 MG/ML  SOLN
50.0000 mL | Freq: Once | INTRAMUSCULAR | Status: AC | PRN
  Administered 2023-11-27: 20 mL

## 2023-11-27 MED ORDER — LIDOCAINE HCL 1 % IJ SOLN
20.0000 mL | Freq: Once | INTRAMUSCULAR | Status: AC
  Administered 2023-11-27: 10 mL via INTRADERMAL

## 2023-11-27 NOTE — Procedures (Signed)
 Interventional Radiology Procedure:   Indications: Routine exchanges.  Bladder cancer  Procedure: Exchange of bilateral nephrostomy tubes  Findings: New 10 Fr tube placed in renal pelvis bilaterally.  Complications: None     EBL: Minimal  Plan: Routine exchanges in 6 weeks.   Marnae Madani R. Philip, MD  Pager: [phone]

## 2023-11-28 ENCOUNTER — Ambulatory Visit (INDEPENDENT_AMBULATORY_CARE_PROVIDER_SITE_OTHER)

## 2023-11-28 ENCOUNTER — Ambulatory Visit (HOSPITAL_BASED_OUTPATIENT_CLINIC_OR_DEPARTMENT_OTHER): Admitting: Physician Assistant

## 2023-11-28 ENCOUNTER — Encounter (HOSPITAL_BASED_OUTPATIENT_CLINIC_OR_DEPARTMENT_OTHER): Payer: Self-pay | Admitting: Physician Assistant

## 2023-11-28 ENCOUNTER — Encounter: Payer: Self-pay | Admitting: Hematology and Oncology

## 2023-11-28 DIAGNOSIS — M25561 Pain in right knee: Secondary | ICD-10-CM | POA: Diagnosis not present

## 2023-11-28 DIAGNOSIS — M25461 Effusion, right knee: Secondary | ICD-10-CM | POA: Diagnosis not present

## 2023-11-28 DIAGNOSIS — M4807 Spinal stenosis, lumbosacral region: Secondary | ICD-10-CM | POA: Diagnosis not present

## 2023-11-28 DIAGNOSIS — M4316 Spondylolisthesis, lumbar region: Secondary | ICD-10-CM | POA: Diagnosis not present

## 2023-11-28 DIAGNOSIS — Z8551 Personal history of malignant neoplasm of bladder: Secondary | ICD-10-CM | POA: Diagnosis not present

## 2023-11-28 DIAGNOSIS — M5127 Other intervertebral disc displacement, lumbosacral region: Secondary | ICD-10-CM | POA: Diagnosis not present

## 2023-11-28 DIAGNOSIS — M545 Low back pain, unspecified: Secondary | ICD-10-CM | POA: Diagnosis not present

## 2023-11-28 DIAGNOSIS — Z936 Other artificial openings of urinary tract status: Secondary | ICD-10-CM | POA: Diagnosis not present

## 2023-11-28 DIAGNOSIS — M5136 Other intervertebral disc degeneration, lumbar region with discogenic back pain only: Secondary | ICD-10-CM | POA: Diagnosis not present

## 2023-11-28 DIAGNOSIS — M1711 Unilateral primary osteoarthritis, right knee: Secondary | ICD-10-CM | POA: Diagnosis not present
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
## 2023-11-29 ENCOUNTER — Inpatient Hospital Stay (HOSPITAL_BASED_OUTPATIENT_CLINIC_OR_DEPARTMENT_OTHER): Admitting: Hematology and Oncology

## 2023-11-29 ENCOUNTER — Inpatient Hospital Stay

## 2023-11-29 ENCOUNTER — Inpatient Hospital Stay: Attending: Hematology and Oncology

## 2023-11-29 VITALS — BP 154/76 | HR 73 | Temp 97.9°F | Resp 14 | Wt 261.4 lb

## 2023-11-29 DIAGNOSIS — C673 Malignant neoplasm of anterior wall of bladder: Secondary | ICD-10-CM | POA: Insufficient documentation

## 2023-11-29 DIAGNOSIS — C671 Malignant neoplasm of dome of bladder: Secondary | ICD-10-CM | POA: Diagnosis not present

## 2023-11-29 DIAGNOSIS — Z79899 Other long term (current) drug therapy: Secondary | ICD-10-CM | POA: Insufficient documentation

## 2023-11-29 DIAGNOSIS — Z936 Other artificial openings of urinary tract status: Secondary | ICD-10-CM | POA: Diagnosis not present

## 2023-11-29 DIAGNOSIS — Z5112 Encounter for antineoplastic immunotherapy: Secondary | ICD-10-CM

## 2023-11-29 DIAGNOSIS — C679 Malignant neoplasm of bladder, unspecified: Secondary | ICD-10-CM

## 2023-11-29 DIAGNOSIS — Z8551 Personal history of malignant neoplasm of bladder: Secondary | ICD-10-CM | POA: Diagnosis not present

## 2023-11-29 DIAGNOSIS — Z95828 Presence of other vascular implants and grafts: Secondary | ICD-10-CM

## 2023-11-29 DIAGNOSIS — E538 Deficiency of other specified B group vitamins: Secondary | ICD-10-CM | POA: Insufficient documentation

## 2023-11-29 LAB — CBC WITH DIFFERENTIAL (CANCER CENTER ONLY)
Abs Immature Granulocytes: 0.03 K/uL (ref 0.00–0.07)
Basophils Absolute: 0 K/uL (ref 0.0–0.1)
Basophils Relative: 1 %
Eosinophils Absolute: 0.1 K/uL (ref 0.0–0.5)
Eosinophils Relative: 2 %
HCT: 28.6 % — ABNORMAL LOW (ref 39.0–52.0)
Hemoglobin: 9.5 g/dL — ABNORMAL LOW (ref 13.0–17.0)
Immature Granulocytes: 1 %
Lymphocytes Relative: 12 %
Lymphs Abs: 0.5 K/uL — ABNORMAL LOW (ref 0.7–4.0)
MCH: 31.1 pg (ref 26.0–34.0)
MCHC: 33.2 g/dL (ref 30.0–36.0)
MCV: 93.8 fL (ref 80.0–100.0)
Monocytes Absolute: 0.7 K/uL (ref 0.1–1.0)
Monocytes Relative: 16 %
Neutro Abs: 2.9 K/uL (ref 1.7–7.7)
Neutrophils Relative %: 68 %
Platelet Count: 124 K/uL — ABNORMAL LOW (ref 150–400)
RBC: 3.05 MIL/uL — ABNORMAL LOW (ref 4.22–5.81)
RDW: 17.9 % — ABNORMAL HIGH (ref 11.5–15.5)
WBC Count: 4.1 K/uL (ref 4.0–10.5)
nRBC: 0 % (ref 0.0–0.2)

## 2023-11-29 LAB — CMP (CANCER CENTER ONLY)
ALT: 48 U/L — ABNORMAL HIGH (ref 0–44)
AST: 62 U/L — ABNORMAL HIGH (ref 15–41)
Albumin: 3.4 g/dL — ABNORMAL LOW (ref 3.5–5.0)
Alkaline Phosphatase: 151 U/L — ABNORMAL HIGH (ref 38–126)
Anion gap: 7 (ref 5–15)
BUN: 16 mg/dL (ref 8–23)
CO2: 29 mmol/L (ref 22–32)
Calcium: 8.2 mg/dL — ABNORMAL LOW (ref 8.9–10.3)
Chloride: 102 mmol/L (ref 98–111)
Creatinine: 1.64 mg/dL — ABNORMAL HIGH (ref 0.61–1.24)
GFR, Estimated: 45 mL/min — ABNORMAL LOW (ref >60)
Glucose, Bld: 176 mg/dL — ABNORMAL HIGH (ref 70–99)
Potassium: 3.8 mmol/L (ref 3.5–5.1)
Sodium: 138 mmol/L (ref 135–145)
Total Bilirubin: 0.8 mg/dL (ref 0.0–1.2)
Total Protein: 6.7 g/dL (ref 6.5–8.1)

## 2023-11-29 MED ORDER — SODIUM CHLORIDE 0.9 % IV SOLN
INTRAVENOUS | Status: DC
Start: 2023-11-29 — End: 2023-11-29

## 2023-11-29 MED ORDER — LIDOCAINE-PRILOCAINE 2.5-2.5 % EX CREA: TOPICAL_CREAM | CUTANEOUS | 0 refills | Status: AC

## 2023-11-29 MED ORDER — SODIUM CHLORIDE 0.9% FLUSH
10.0000 mL | Freq: Once | INTRAVENOUS | Status: AC
Start: 2023-11-29 — End: 2023-11-29
  Administered 2023-11-29: 10 mL

## 2023-11-29 MED ORDER — PROCHLORPERAZINE MALEATE 10 MG PO TABS
10.0000 mg | ORAL_TABLET | Freq: Once | ORAL | Status: AC
  Administered 2023-11-29: 10 mg via ORAL
  Filled 2023-11-29: qty 1

## 2023-11-29 MED ORDER — SODIUM CHLORIDE 0.9 % IV SOLN
125.0000 mg | Freq: Once | INTRAVENOUS | Status: AC
  Administered 2023-11-29: 125 mg via INTRAVENOUS
  Filled 2023-11-29: qty 8.5

## 2023-11-29 NOTE — Patient Instructions (Addendum)
 [facility] - A DEPT OF [facility]  Discharge Instructions: Thank you for choosing [facility] to provide your oncology and hematology care.   If you have a lab appointment with the Cancer Center, please go directly to the Cancer Center and check in at the registration area.   Wear comfortable clothing and clothing appropriate for easy access to any Portacath or PICC line.   We strive to give you quality time with your provider. You may need to reschedule your appointment if you arrive late (15 or more minutes).  Arriving late affects you and other patients whose appointments are after yours.  Also, if you miss three or more appointments without notifying the office, you may be dismissed from the clinic at the provider's discretion.      For prescription refill requests, have your pharmacy contact our office and allow 72 hours for refills to be completed.    Today you received the following chemotherapy and/or immunotherapy agent: Padcev    To help prevent nausea and vomiting after your treatment, we encourage you to take your nausea medication as directed.  BELOW ARE SYMPTOMS THAT SHOULD BE REPORTED IMMEDIATELY: *FEVER GREATER THAN 100.4 F (38 C) OR HIGHER *CHILLS OR SWEATING *NAUSEA AND VOMITING THAT IS NOT CONTROLLED WITH YOUR NAUSEA MEDICATION *UNUSUAL SHORTNESS OF BREATH *UNUSUAL BRUISING OR BLEEDING *URINARY PROBLEMS (pain or burning when urinating, or frequent urination) *BOWEL PROBLEMS (unusual diarrhea, constipation, pain near the anus) TENDERNESS IN MOUTH AND THROAT WITH OR WITHOUT PRESENCE OF ULCERS (sore throat, sores in mouth, or a toothache) UNUSUAL RASH, SWELLING OR PAIN  UNUSUAL VAGINAL DISCHARGE OR ITCHING   Items with * indicate a potential emergency and should be followed up as soon as possible or go to the Emergency Department if any problems should occur.  Please show the CHEMOTHERAPY ALERT CARD or IMMUNOTHERAPY ALERT  CARD at check-in to the Emergency Department and triage nurse.  Should you have questions after your visit or need to cancel or reschedule your appointment, please contact [facility] - A DEPT OF [facility]  Dept: [phone]  and follow the prompts.  Office hours are 8:00 a.m. to 4:30 p.m. Monday - Friday. Please note that voicemails left after 4:00 p.m. may not be returned until the following business day.  We are closed weekends and major holidays. You have access to a nurse at all times for urgent questions. Please call the main number to the clinic Dept: [phone] and follow the prompts.   For any non-urgent questions, you may also contact your provider using MyChart. We now offer e-Visits for anyone 38 and older to request care online for non-urgent symptoms. For details visit [email or web address].   Also download the MyChart app! Go to the app store, search MyChart, open the app, select [place], and log in with your MyChart username and password.  Enfortumab Vedotin  Injection What is this medication? ENFORTUMAB VEDOTIN  (en FORT ue mab ve DOE tin) treats bladder cancer and kidney cancer. It works by blocking a protein that causes cancer cells to grow and multiply. This helps to slow or stop the spread of cancer cells. This medicine may be used for other purposes; ask your health care provider or pharmacist if you have questions. COMMON BRAND NAME(S): PADCEV  What should I tell my care team before I take this medication? They need to know if you have any of these conditions: Diabetes Eye  disease Liver disease Lung disease Tingling of the fingers or toes or other nerve disorder Vision problems An unusual or allergic reaction to enfortumab vedotin , other medications, foods, dyes, or preservatives Pregnant or trying to get pregnant Breast-feeding How should I use this medication? This medication is injected into a vein. It is given by your care  team in a hospital or clinic setting. Talk to your care team about the use of this medication in children. Special care may be needed. Overdosage: If you think you have taken too much of this medicine contact a poison control center or emergency room at once. NOTE: This medicine is only for you. Do not share this medicine with others. What if I miss a dose? Keep appointments for follow-up doses. It is important not to miss your dose. Call your care team if you are unable to keep an appointment. What may interact with this medication? This medication may affect how other medications work, and other medications may affect how this medication works. Talk with your care team about all of the medications you take. They may suggest changes to your treatment plan to lower the risk of side effects and to make sure your medications work as intended. This list may not describe all possible interactions. Give your health care provider a list of all the medicines, herbs, non-prescription drugs, or dietary supplements you use. Also tell them if you smoke, drink alcohol, or use illegal drugs. Some items may interact with your medicine. What should I watch for while using this medication? Your condition will be monitored carefully while you are receiving this medication. This medication may make you feel generally unwell. This is not uncommon as chemotherapy can affect healthy cells as well as cancer cells. Report any side effects. Continue your course of treatment even though you feel ill unless your care team tells you to stop. This medication may increase blood sugar. The risk may be higher in patients who already have diabetes. Ask your care team what you can do to lower your risk of diabetes while taking this medication. This medication can cause a serious condition in which there is too much acid in your blood. If you develop nausea, vomiting, stomach pain, unusual tiredness, or trouble breathing, stop taking  this medication and call your care team right away. If possible, use a ketone dipstick to check for ketones in your urine. This medication may cause dry eyes and blurred vision. If you wear contact lenses, you may feel some discomfort. Lubricating eye drops may help. See your care team if the problem does not go away or is severe. Tell your care team right away if you have any change in your eyesight. This medication may increase your risk of getting an infection. Call your care team for advice if you get a fever, chills, sore throat, or other symptoms of a cold or flu. Do not treat yourself. Try to avoid being around people who are sick. Avoid taking medications that contain aspirin , acetaminophen , ibuprofen, naproxen, or ketoprofen unless instructed by your care team. These medications may hide a fever. This medication may cause serious skin reactions. They can happen weeks to months after starting the medication. Contact your care team right away if you notice fevers or flu-like symptoms with a rash. The rash may be red or purple and then turn into blisters or peeling of the skin. You may also notice a red rash with swelling of the face, lips or lymph nodes in your neck or  under your arms. Talk to your care team if you or your partner may be pregnant. Serious birth defects can occur if you take this medication during pregnancy and for 2 months after the last dose. You will need a negative pregnancy test before starting this medication. Contraception is recommended while taking this medication and for 2 months after the last dose. Your care team can help you find the option that works for you. If your partner can get pregnant, use a condom during sex while taking this medication and for 4 months after the last dose. Do not breastfeed while taking this medicine or for at least 3 weeks after the last dose. This medication may cause infertility. Talk to your care team if you are concerned about your  fertility. What side effects may I notice from receiving this medication? Side effects that you should report to your care team as soon as possible: Allergic reactions--skin rash, itching, hives, swelling of the face, lips, tongue, or throat Dry cough, shortness of breath or trouble breathing Eye pain, redness, irritation, or discharge with blurry or decreased vision High blood sugar (hyperglycemia)--increased thirst or amount of urine, unusual weakness or fatigue, blurry vision Painful swelling, warmth, or redness of the skin, blisters or sores at the infusion site Pain, tingling, or numbness in the hands or feet Redness, blistering, peeling, or loosening of the skin, including inside the mouth Unusual bruising or bleeding Side effects that usually do not require medical attention (report these to your care team if they continue or are bothersome): Change in taste Diarrhea Dry eyes Fatigue Hair loss Loss of appetite This list may not describe all possible side effects. Call your doctor for medical advice about side effects. You may report side effects to FDA at 1-800-FDA-1088. Where should I keep my medication? This medication is given in a hospital or clinic. It will not be stored at home. NOTE: This sheet is a summary. It may not cover all possible information. If you have questions about this medicine, talk to your doctor, pharmacist, or health care provider.  2024 Elsevier/Gold Standard (2021-08-24 00:00:00)

## 2023-11-29 NOTE — Progress Notes (Signed)
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 received 50.4 Gray in 28 fractions. PLAN:  --Labs from today show white blood cell 4.1, hemoglobin 9.5, MCV 93.8, platelets 124.  Creatinine 1.64, LFTs within normal limits --today is Cycle 2 Day 1 of Enfortumab therapy  --RTC weekly for treatment and every 2 weeks for clinic visit.  # Skin Rash -- Occurred after receiving blood transfusion, etiology not entirely clear. -- Patient has been applying moisturizer to the skin and has been taking Benadryl . -- Skin is persistently dry and itchy and erythematous. -- Prescribed steroid taper with 60 mg for 5 days, 40 for 5 days, and 20 for 5 days.  #Anemia --Hemoglobin 9.5 --May be multifactorial due to nutritional deficiency as well as kidney dysfunction. -- Nutritional labs checked today show borderline vitamin B12 deficiency and iron deficiency. -- Patient is currently taking vitamin B12 p.o. daily and ferrous sulfate  every other day. -- today will receive IV monoferric  and B12 IM injections to bolster levels.  #Hypokalemia: --Potassium level is 3.8 today --Patient is currently taking potassium supplement 20 meq BID. Advised to continue  #Flank pain: --Secondary to bilateral nephrostomy. Patient is currently  taking tylenol  with minimal improvement.  --Sent prescription of tramadol  50 mg PO q 6 hours as needed.   # AKI -- Currently has bilateral nephrostomies, underwent exchange on 08/07/2023. --monitoring Cr at each visit.   #Supportive Care -- chemotherapy education complete -- port placed -- zofran  8mg  q8H PRN and compazine  10mg  PO q6H for nausea -- EMLA  cream for port -- no pain medication required at this time.    No orders of the defined types were placed in this encounter.  All questions were answered. The patient knows to call the clinic with any problems, questions or concerns.  I have spent a total of 30 minutes minutes of face-to-face and non-face-to-face time, preparing to see the patient, performing a medically appropriate examination, counseling and educating the patient, documenting clinical information in the electronic health record,and care coordination.   Norleen IVAR Kidney, MD Department of Hematology/Oncology [facility] at [facility] Phone: [phone] Pager: [phone] Email: [email or web address]

## 2023-12-01 ENCOUNTER — Telehealth: Payer: Self-pay

## 2023-12-01 NOTE — Transitions of Care (Post Inpatient/ED Visit) (Signed)
   12/01/2023  Name: KAPONO LUHN MRN: [id number] DOB: 12-07-1955  Today's TOC FU Call Status:    Attempted to reach the patient regarding the most recent Inpatient/ED visit.  Follow Up Plan: Additional outreach attempts will be made to reach the patient to complete the Transitions of Care (Post Inpatient/ED visit) call.   Signature : Anay Rathe, CMA
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
## 2023-12-02 ENCOUNTER — Other Ambulatory Visit: Payer: Self-pay

## 2023-12-04 ENCOUNTER — Telehealth: Payer: Self-pay | Admitting: *Deleted

## 2023-12-04 NOTE — Telephone Encounter (Addendum)
 Medication Prior Authorization Status  Processed CoverMyMeds KEY: [id number] for Lisocaine - Prilocaine  2.5%  N/A Today  Per [employer]  PA Case ID: RX #: [id number]   Duplicate.  Previously authorized. Effective 11/14/2023 through 02/12/2024.

## 2023-12-06 ENCOUNTER — Ambulatory Visit: Admitting: Family Medicine

## 2023-12-06 ENCOUNTER — Encounter: Payer: Self-pay | Admitting: Family Medicine

## 2023-12-06 VITALS — BP 110/64 | HR 73 | Temp 98.1°F | Wt 258.0 lb

## 2023-12-06 DIAGNOSIS — F411 Generalized anxiety disorder: Secondary | ICD-10-CM

## 2023-12-06 DIAGNOSIS — G4700 Insomnia, unspecified: Secondary | ICD-10-CM | POA: Diagnosis not present

## 2023-12-06 DIAGNOSIS — S40021D Contusion of right upper arm, subsequent encounter: Secondary | ICD-10-CM

## 2023-12-06 DIAGNOSIS — S60221D Contusion of right hand, subsequent encounter: Secondary | ICD-10-CM

## 2023-12-06 DIAGNOSIS — S8001XD Contusion of right knee, subsequent encounter: Secondary | ICD-10-CM | POA: Diagnosis not present

## 2023-12-06 MED ORDER — VENLAFAXINE HCL ER 150 MG PO CP24: 150.0000 mg | ORAL_CAPSULE | Freq: Every day | ORAL | 3 refills | Status: AC

## 2023-12-06 MED ORDER — TEMAZEPAM 30 MG PO CAPS: 30.0000 mg | ORAL_CAPSULE | Freq: Every evening | ORAL | 1 refills | Status: AC | PRN
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
## 2023-12-07 ENCOUNTER — Inpatient Hospital Stay

## 2023-12-07 VITALS — BP 157/76 | HR 67 | Temp 98.3°F | Resp 16

## 2023-12-07 DIAGNOSIS — C679 Malignant neoplasm of bladder, unspecified: Secondary | ICD-10-CM

## 2023-12-07 DIAGNOSIS — Z5112 Encounter for antineoplastic immunotherapy: Secondary | ICD-10-CM | POA: Diagnosis not present

## 2023-12-07 DIAGNOSIS — Z95828 Presence of other vascular implants and grafts: Secondary | ICD-10-CM

## 2023-12-07 LAB — CMP (CANCER CENTER ONLY)
ALT: 26 U/L (ref 0–44)
AST: 34 U/L (ref 15–41)
Albumin: 3.7 g/dL (ref 3.5–5.0)
Alkaline Phosphatase: 108 U/L (ref 38–126)
Anion gap: 7 (ref 5–15)
BUN: 24 mg/dL — ABNORMAL HIGH (ref 8–23)
CO2: 26 mmol/L (ref 22–32)
Calcium: 8.4 mg/dL — ABNORMAL LOW (ref 8.9–10.3)
Chloride: 105 mmol/L (ref 98–111)
Creatinine: 1.64 mg/dL — ABNORMAL HIGH (ref 0.61–1.24)
GFR, Estimated: 45 mL/min — ABNORMAL LOW (ref >60)
Glucose, Bld: 176 mg/dL — ABNORMAL HIGH (ref 70–99)
Potassium: 3.9 mmol/L (ref 3.5–5.1)
Sodium: 138 mmol/L (ref 135–145)
Total Bilirubin: 0.6 mg/dL (ref 0.0–1.2)
Total Protein: 6.9 g/dL (ref 6.5–8.1)

## 2023-12-07 LAB — CBC WITH DIFFERENTIAL (CANCER CENTER ONLY)
Abs Immature Granulocytes: 0.06 K/uL (ref 0.00–0.07)
Basophils Absolute: 0 K/uL (ref 0.0–0.1)
Basophils Relative: 1 %
Eosinophils Absolute: 0 K/uL (ref 0.0–0.5)
Eosinophils Relative: 1 %
HCT: 30.1 % — ABNORMAL LOW (ref 39.0–52.0)
Hemoglobin: 10.2 g/dL — ABNORMAL LOW (ref 13.0–17.0)
Immature Granulocytes: 1 %
Lymphocytes Relative: 14 %
Lymphs Abs: 0.7 K/uL (ref 0.7–4.0)
MCH: 31.4 pg (ref 26.0–34.0)
MCHC: 33.9 g/dL (ref 30.0–36.0)
MCV: 92.6 fL (ref 80.0–100.0)
Monocytes Absolute: 0.6 K/uL (ref 0.1–1.0)
Monocytes Relative: 13 %
Neutro Abs: 3.4 K/uL (ref 1.7–7.7)
Neutrophils Relative %: 70 %
Platelet Count: 186 K/uL (ref 150–400)
RBC: 3.25 MIL/uL — ABNORMAL LOW (ref 4.22–5.81)
RDW: 17 % — ABNORMAL HIGH (ref 11.5–15.5)
WBC Count: 4.8 K/uL (ref 4.0–10.5)
nRBC: 0.6 % — ABNORMAL HIGH (ref 0.0–0.2)

## 2023-12-07 MED ORDER — PROCHLORPERAZINE MALEATE 10 MG PO TABS
10.0000 mg | ORAL_TABLET | Freq: Once | ORAL | Status: AC
  Administered 2023-12-07: 10 mg via ORAL
  Filled 2023-12-07: qty 1

## 2023-12-07 MED ORDER — SODIUM CHLORIDE 0.9 % IV SOLN
INTRAVENOUS | Status: DC
Start: 2023-12-07 — End: 2023-12-07

## 2023-12-07 MED ORDER — CYANOCOBALAMIN 1000 MCG/ML IJ SOLN
1000.0000 ug | Freq: Once | INTRAMUSCULAR | Status: AC
  Administered 2023-12-07: 1000 ug via INTRAMUSCULAR
  Filled 2023-12-07: qty 1

## 2023-12-07 MED ORDER — SODIUM CHLORIDE 0.9 % IV SOLN
125.0000 mg | Freq: Once | INTRAVENOUS | Status: AC
  Administered 2023-12-07: 125 mg via INTRAVENOUS
  Filled 2023-12-07: qty 9

## 2023-12-07 MED ORDER — SODIUM CHLORIDE 0.9% FLUSH
10.0000 mL | Freq: Once | INTRAVENOUS | Status: AC
  Administered 2023-12-07: 10 mL

## 2023-12-07 NOTE — Patient Instructions (Signed)
 [facility] - A DEPT OF [facility]  Discharge Instructions: Thank you for choosing [facility] to provide your oncology and hematology care.   If you have a lab appointment with the Cancer Center, please go directly to the Cancer Center and check in at the registration area.   Wear comfortable clothing and clothing appropriate for easy access to any Portacath or PICC line.   We strive to give you quality time with your provider. You may need to reschedule your appointment if you arrive late (15 or more minutes).  Arriving late affects you and other patients whose appointments are after yours.  Also, if you miss three or more appointments without notifying the office, you may be dismissed from the clinic at the provider's discretion.      For prescription refill requests, have your pharmacy contact our office and allow 72 hours for refills to be completed.    Today you received the following chemotherapy and/or immunotherapy agents: Padcev.      To help prevent nausea and vomiting after your treatment, we encourage you to take your nausea medication as directed.  BELOW ARE SYMPTOMS THAT SHOULD BE REPORTED IMMEDIATELY: *FEVER GREATER THAN 100.4 F (38 C) OR HIGHER *CHILLS OR SWEATING *NAUSEA AND VOMITING THAT IS NOT CONTROLLED WITH YOUR NAUSEA MEDICATION *UNUSUAL SHORTNESS OF BREATH *UNUSUAL BRUISING OR BLEEDING *URINARY PROBLEMS (pain or burning when urinating, or frequent urination) *BOWEL PROBLEMS (unusual diarrhea, constipation, pain near the anus) TENDERNESS IN MOUTH AND THROAT WITH OR WITHOUT PRESENCE OF ULCERS (sore throat, sores in mouth, or a toothache) UNUSUAL RASH, SWELLING OR PAIN  UNUSUAL VAGINAL DISCHARGE OR ITCHING   Items with * indicate a potential emergency and should be followed up as soon as possible or go to the Emergency Department if any problems should occur.  Please show the CHEMOTHERAPY ALERT CARD or IMMUNOTHERAPY  ALERT CARD at check-in to the Emergency Department and triage nurse.  Should you have questions after your visit or need to cancel or reschedule your appointment, please contact [facility] - A DEPT OF [facility]  Dept: [phone]  and follow the prompts.  Office hours are 8:00 a.m. to 4:30 p.m. Monday - Friday. Please note that voicemails left after 4:00 p.m. may not be returned until the following business day.  We are closed weekends and major holidays. You have access to a nurse at all times for urgent questions. Please call the main number to the clinic Dept: [phone] and follow the prompts.   For any non-urgent questions, you may also contact your provider using MyChart. We now offer e-Visits for anyone 35 and older to request care online for non-urgent symptoms. For details visit [email or web address].   Also download the MyChart app! Go to the app store, search "MyChart", open the app, select [place], and log in with your MyChart username and password.

## 2023-12-14 ENCOUNTER — Inpatient Hospital Stay

## 2023-12-14 ENCOUNTER — Ambulatory Visit: Admitting: Family Medicine

## 2023-12-14 ENCOUNTER — Inpatient Hospital Stay (HOSPITAL_BASED_OUTPATIENT_CLINIC_OR_DEPARTMENT_OTHER): Admitting: Hematology and Oncology

## 2023-12-14 VITALS — BP 134/74 | HR 69 | Temp 97.4°F | Resp 13 | Wt 253.6 lb

## 2023-12-14 DIAGNOSIS — Z5112 Encounter for antineoplastic immunotherapy: Secondary | ICD-10-CM | POA: Diagnosis not present

## 2023-12-14 DIAGNOSIS — Z95828 Presence of other vascular implants and grafts: Secondary | ICD-10-CM

## 2023-12-14 DIAGNOSIS — C679 Malignant neoplasm of bladder, unspecified: Secondary | ICD-10-CM

## 2023-12-14 LAB — CMP (CANCER CENTER ONLY)
ALT: 31 U/L (ref 0–44)
AST: 43 U/L — ABNORMAL HIGH (ref 15–41)
Albumin: 3.7 g/dL (ref 3.5–5.0)
Alkaline Phosphatase: 103 U/L (ref 38–126)
Anion gap: 7 (ref 5–15)
BUN: 16 mg/dL (ref 8–23)
CO2: 26 mmol/L (ref 22–32)
Calcium: 8.5 mg/dL — ABNORMAL LOW (ref 8.9–10.3)
Chloride: 104 mmol/L (ref 98–111)
Creatinine: 1.57 mg/dL — ABNORMAL HIGH (ref 0.61–1.24)
GFR, Estimated: 47 mL/min — ABNORMAL LOW (ref >60)
Glucose, Bld: 202 mg/dL — ABNORMAL HIGH (ref 70–99)
Potassium: 3.6 mmol/L (ref 3.5–5.1)
Sodium: 137 mmol/L (ref 135–145)
Total Bilirubin: 0.8 mg/dL (ref 0.0–1.2)
Total Protein: 7.1 g/dL (ref 6.5–8.1)

## 2023-12-14 LAB — CBC WITH DIFFERENTIAL (CANCER CENTER ONLY)
Abs Immature Granulocytes: 0.03 K/uL (ref 0.00–0.07)
Basophils Absolute: 0 K/uL (ref 0.0–0.1)
Basophils Relative: 1 %
Eosinophils Absolute: 0.1 K/uL (ref 0.0–0.5)
Eosinophils Relative: 2 %
HCT: 29.8 % — ABNORMAL LOW (ref 39.0–52.0)
Hemoglobin: 10.1 g/dL — ABNORMAL LOW (ref 13.0–17.0)
Immature Granulocytes: 1 %
Lymphocytes Relative: 10 %
Lymphs Abs: 0.6 K/uL — ABNORMAL LOW (ref 0.7–4.0)
MCH: 30.8 pg (ref 26.0–34.0)
MCHC: 33.9 g/dL (ref 30.0–36.0)
MCV: 90.9 fL (ref 80.0–100.0)
Monocytes Absolute: 0.6 K/uL (ref 0.1–1.0)
Monocytes Relative: 10 %
Neutro Abs: 4.4 K/uL (ref 1.7–7.7)
Neutrophils Relative %: 76 %
Platelet Count: 158 K/uL (ref 150–400)
RBC: 3.28 MIL/uL — ABNORMAL LOW (ref 4.22–5.81)
RDW: 17.1 % — ABNORMAL HIGH (ref 11.5–15.5)
WBC Count: 5.7 K/uL (ref 4.0–10.5)
nRBC: 0 % (ref 0.0–0.2)

## 2023-12-14 MED ORDER — SODIUM CHLORIDE 0.9 % IV SOLN: INTRAVENOUS | Status: DC

## 2023-12-14 MED ORDER — PROCHLORPERAZINE MALEATE 10 MG PO TABS
10.0000 mg | ORAL_TABLET | Freq: Once | ORAL | Status: AC
  Administered 2023-12-14: 10 mg via ORAL
  Filled 2023-12-14: qty 1

## 2023-12-14 MED ORDER — SODIUM CHLORIDE 0.9% FLUSH
10.0000 mL | Freq: Once | INTRAVENOUS | Status: AC
  Administered 2023-12-14: 10 mL

## 2023-12-14 MED ORDER — SODIUM CHLORIDE 0.9 % IV SOLN
125.0000 mg | Freq: Once | INTRAVENOUS | Status: AC
  Administered 2023-12-14: 125 mg via INTRAVENOUS
  Filled 2023-12-14: qty 8.5

## 2023-12-14 NOTE — Progress Notes (Signed)
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 the dorsum of the hand. Right forearm: No acute fracture or dislocation is seen. Degenerative changes are noted at the wrist and elbow. No joint effusion at the elbow. Soft tissues are unremarkable. IMPRESSION: No acute fracture or dislocation at the right hand or forearm. Electronically Signed   By: Leita Birmingham M.D.   On: 11/25/2023 16:24     ASSESSMENT & PLAN Luis [name] 68 y.o. 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  After review of the labs, review of the records, and discussion with the patient the patients findings are most consistent with BCG unresponsive high risk non-muscle invasive bladder cancer, not a candidate for cystectomy.  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 21 days until progression or intolerance up to 24 months. --Received 8 cycles of Pembrolizumab  on 08/12/2022-01/13/2023: Cycle 8 of Pembrolizumab   --On 03/03/2023, underwent Cystectomy/Prostatectomy showed infiltrative high-grade urothelial carcinoma, size 5.8 cm  involving bladder dome, anterior wall and prostatic stroma. Tumor invades directly into prostatic stroma at apex and mid portion of the gland (pT4a). Metastatic urothelial cancer in one of two left common iliac lymph nodes.  -- Given the results of the pathology showing positive margins and spread to the lymph node we are considering radiation therapy versus chemotherapy.  For chemotherapy, recommend gemcitabine  and cisplatin x 4 cycles.  This would consist of gemcitabine  1000 mg/m on days 1 and 8 and cisplatin 70 mg/m on day 1 of 21-day cycle for 4 cycles. -- Due to kidney dysfunction and anemia, chemotherapy was deferred and patient underwent adjuvant radiation to the surgical bed from 06/19/2023 until 07/27/2023.  He received 50.4 Gray in 28 fractions. PLAN:  --Labs from today show white blood cell 5.7, Hgb 10.1, MCV 90.9, Plt 158.  Creatinine 1.57, LFTs within normal limits --today is Cycle 2 Day 15 of Enfortumab therapy  --RTC weekly for treatment and every 2 weeks for clinic visit.  # Skin Rash -- Occurred after receiving blood transfusion, etiology not entirely clear. -- Patient has been applying moisturizer to the skin and has been taking Benadryl . -- Skin is persistently dry and itchy and erythematous. -- Prescribed steroid taper with 60 mg for 5 days, 40 for 5 days, and 20 for 5 days.  #Anemia --Hemoglobin 10.1  --May be multifactorial due to nutritional deficiency as well as kidney dysfunction. -- Nutritional labs checked today show borderline vitamin B12 deficiency and iron deficiency. -- Patient is currently taking vitamin B12 p.o. daily and ferrous sulfate  every other day. -- today will receive IV monoferric  and B12 IM injections to bolster levels.  #Hypokalemia: --Potassium level is 3.6 today --Patient is currently taking potassium supplement 20 meq BID. Advised to continue  #Flank pain: --Secondary to bilateral nephrostomy. Patient is currently taking tylenol  with minimal  improvement.  --Sent prescription of tramadol  50 mg PO q 6 hours as needed.   # AKI -- Currently has bilateral nephrostomies, underwent exchange on 08/07/2023. --monitoring Cr at each visit.   #Supportive Care -- chemotherapy education complete -- port placed -- zofran  8mg  q8H PRN and compazine  10mg  PO q6H for nausea -- EMLA  cream for port -- no pain medication required at this time.    No orders of the defined types were placed in this encounter.  All questions were answered. The patient knows to call the clinic with any problems, questions or concerns.  I have spent a total of 30 minutes minutes of face-to-face and non-face-to-face time, preparing to see the patient, performing a medically appropriate examination, counseling and educating the patient, documenting clinical information in the electronic health record,and care coordination.   Norleen IVAR Kidney, MD Department of Hematology/Oncology [facility] Phone: [phone] Pager: [phone] Email: [email or web address]

## 2023-12-14 NOTE — Patient Instructions (Signed)
 [facility] - A DEPT OF [facility]  Discharge Instructions: Thank you for choosing [facility] to provide your oncology and hematology care.   If you have a lab appointment with the Cancer Center, please go directly to the Cancer Center and check in at the registration area.   Wear comfortable clothing and clothing appropriate for easy access to any Portacath or PICC line.   We strive to give you quality time with your provider. You may need to reschedule your appointment if you arrive late (15 or more minutes).  Arriving late affects you and other patients whose appointments are after yours.  Also, if you miss three or more appointments without notifying the office, you may be dismissed from the clinic at the provider's discretion.      For prescription refill requests, have your pharmacy contact our office and allow 72 hours for refills to be completed.    Today you received the following chemotherapy and/or immunotherapy agents: Padcev.      To help prevent nausea and vomiting after your treatment, we encourage you to take your nausea medication as directed.  BELOW ARE SYMPTOMS THAT SHOULD BE REPORTED IMMEDIATELY: *FEVER GREATER THAN 100.4 F (38 C) OR HIGHER *CHILLS OR SWEATING *NAUSEA AND VOMITING THAT IS NOT CONTROLLED WITH YOUR NAUSEA MEDICATION *UNUSUAL SHORTNESS OF BREATH *UNUSUAL BRUISING OR BLEEDING *URINARY PROBLEMS (pain or burning when urinating, or frequent urination) *BOWEL PROBLEMS (unusual diarrhea, constipation, pain near the anus) TENDERNESS IN MOUTH AND THROAT WITH OR WITHOUT PRESENCE OF ULCERS (sore throat, sores in mouth, or a toothache) UNUSUAL RASH, SWELLING OR PAIN  UNUSUAL VAGINAL DISCHARGE OR ITCHING   Items with * indicate a potential emergency and should be followed up as soon as possible or go to the Emergency Department if any problems should occur.  Please show the CHEMOTHERAPY ALERT CARD or IMMUNOTHERAPY  ALERT CARD at check-in to the Emergency Department and triage nurse.  Should you have questions after your visit or need to cancel or reschedule your appointment, please contact [facility] - A DEPT OF [facility]  Dept: [phone]  and follow the prompts.  Office hours are 8:00 a.m. to 4:30 p.m. Monday - Friday. Please note that voicemails left after 4:00 p.m. may not be returned until the following business day.  We are closed weekends and major holidays. You have access to a nurse at all times for urgent questions. Please call the main number to the clinic Dept: [phone] and follow the prompts.   For any non-urgent questions, you may also contact your provider using MyChart. We now offer e-Visits for anyone 35 and older to request care online for non-urgent symptoms. For details visit [email or web address].   Also download the MyChart app! Go to the app store, search "MyChart", open the app, select [place], and log in with your MyChart username and password.

## 2023-12-21 ENCOUNTER — Other Ambulatory Visit: Payer: Self-pay

## 2023-12-24 ENCOUNTER — Other Ambulatory Visit: Payer: Self-pay | Admitting: Hematology and Oncology

## 2023-12-24 DIAGNOSIS — C679 Malignant neoplasm of bladder, unspecified: Secondary | ICD-10-CM

## 2023-12-26 DIAGNOSIS — C679 Malignant neoplasm of bladder, unspecified: Secondary | ICD-10-CM | POA: Diagnosis not present

## 2023-12-26 DIAGNOSIS — E669 Obesity, unspecified: Secondary | ICD-10-CM | POA: Diagnosis not present

## 2023-12-26 DIAGNOSIS — N1832 Chronic kidney disease, stage 3b: Secondary | ICD-10-CM | POA: Diagnosis not present

## 2023-12-26 DIAGNOSIS — N139 Obstructive and reflux uropathy, unspecified: Secondary | ICD-10-CM | POA: Diagnosis not present

## 2023-12-26 DIAGNOSIS — G4733 Obstructive sleep apnea (adult) (pediatric): Secondary | ICD-10-CM | POA: Diagnosis not present

## 2023-12-26 DIAGNOSIS — I129 Hypertensive chronic kidney disease with stage 1 through stage 4 chronic kidney disease, or unspecified chronic kidney disease: Secondary | ICD-10-CM | POA: Diagnosis not present

## 2023-12-28 ENCOUNTER — Inpatient Hospital Stay: Attending: Hematology and Oncology

## 2023-12-28 ENCOUNTER — Inpatient Hospital Stay (HOSPITAL_BASED_OUTPATIENT_CLINIC_OR_DEPARTMENT_OTHER): Admitting: Hematology and Oncology

## 2023-12-28 ENCOUNTER — Inpatient Hospital Stay

## 2023-12-28 VITALS — BP 139/76 | HR 57 | Temp 98.1°F | Resp 14

## 2023-12-28 DIAGNOSIS — C679 Malignant neoplasm of bladder, unspecified: Secondary | ICD-10-CM

## 2023-12-28 DIAGNOSIS — C671 Malignant neoplasm of dome of bladder: Secondary | ICD-10-CM | POA: Diagnosis not present

## 2023-12-28 DIAGNOSIS — Z5189 Encounter for other specified aftercare: Secondary | ICD-10-CM | POA: Diagnosis not present

## 2023-12-28 DIAGNOSIS — Z5112 Encounter for antineoplastic immunotherapy: Secondary | ICD-10-CM

## 2023-12-28 DIAGNOSIS — Z79899 Other long term (current) drug therapy: Secondary | ICD-10-CM | POA: Diagnosis not present

## 2023-12-28 DIAGNOSIS — Z95828 Presence of other vascular implants and grafts: Secondary | ICD-10-CM | POA: Diagnosis not present

## 2023-12-28 DIAGNOSIS — C673 Malignant neoplasm of anterior wall of bladder: Secondary | ICD-10-CM | POA: Diagnosis not present

## 2023-12-28 DIAGNOSIS — E538 Deficiency of other specified B group vitamins: Secondary | ICD-10-CM | POA: Insufficient documentation

## 2023-12-28 LAB — CBC WITH DIFFERENTIAL (CANCER CENTER ONLY)
Abs Immature Granulocytes: 0.08 K/uL — ABNORMAL HIGH (ref 0.00–0.07)
Basophils Absolute: 0 K/uL (ref 0.0–0.1)
Basophils Relative: 1 %
Eosinophils Absolute: 0.1 K/uL (ref 0.0–0.5)
Eosinophils Relative: 2 %
HCT: 28.1 % — ABNORMAL LOW (ref 39.0–52.0)
Hemoglobin: 9.3 g/dL — ABNORMAL LOW (ref 13.0–17.0)
Immature Granulocytes: 2 %
Lymphocytes Relative: 12 %
Lymphs Abs: 0.6 K/uL — ABNORMAL LOW (ref 0.7–4.0)
MCH: 30.6 pg (ref 26.0–34.0)
MCHC: 33.1 g/dL (ref 30.0–36.0)
MCV: 92.4 fL (ref 80.0–100.0)
Monocytes Absolute: 0.6 K/uL (ref 0.1–1.0)
Monocytes Relative: 12 %
Neutro Abs: 3.5 K/uL (ref 1.7–7.7)
Neutrophils Relative %: 71 %
Platelet Count: 167 K/uL (ref 150–400)
RBC: 3.04 MIL/uL — ABNORMAL LOW (ref 4.22–5.81)
RDW: 18.5 % — ABNORMAL HIGH (ref 11.5–15.5)
WBC Count: 4.9 K/uL (ref 4.0–10.5)
nRBC: 0 % (ref 0.0–0.2)

## 2023-12-28 LAB — CMP (CANCER CENTER ONLY)
ALT: 28 U/L (ref 0–44)
AST: 30 U/L (ref 15–41)
Albumin: 3.4 g/dL — ABNORMAL LOW (ref 3.5–5.0)
Alkaline Phosphatase: 109 U/L (ref 38–126)
Anion gap: 6 (ref 5–15)
BUN: 20 mg/dL (ref 8–23)
CO2: 29 mmol/L (ref 22–32)
Calcium: 8.4 mg/dL — ABNORMAL LOW (ref 8.9–10.3)
Chloride: 99 mmol/L (ref 98–111)
Creatinine: 1.82 mg/dL — ABNORMAL HIGH (ref 0.61–1.24)
GFR, Estimated: 40 mL/min — ABNORMAL LOW (ref >60)
Glucose, Bld: 168 mg/dL — ABNORMAL HIGH (ref 70–99)
Potassium: 3.8 mmol/L (ref 3.5–5.1)
Sodium: 134 mmol/L — ABNORMAL LOW (ref 135–145)
Total Bilirubin: 0.9 mg/dL (ref 0.0–1.2)
Total Protein: 7.3 g/dL (ref 6.5–8.1)

## 2023-12-28 MED ORDER — SODIUM CHLORIDE 0.9 % IV SOLN
125.0000 mg | Freq: Once | INTRAVENOUS | Status: AC
  Administered 2023-12-28: 125 mg via INTRAVENOUS
  Filled 2023-12-28: qty 10.5

## 2023-12-28 MED ORDER — PROCHLORPERAZINE MALEATE 10 MG PO TABS
10.0000 mg | ORAL_TABLET | Freq: Once | ORAL | Status: AC
  Administered 2023-12-28: 10 mg via ORAL
  Filled 2023-12-28: qty 1

## 2023-12-28 MED ORDER — SODIUM CHLORIDE 0.9 % IV SOLN: INTRAVENOUS | Status: DC

## 2023-12-28 NOTE — Progress Notes (Signed)
 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 Federico  08/12/2022: Cycle 1 of Pembrolizumab  09/02/2022: Cycle 2 of Pembrolizumab   09/23/2022: Cycle 3 of Pembrolizumab  10/14/2022: Cycle 4 of Pembrolizumab  11/04/2022: Cycle 5 of Pembrolizumab  11/25/2022: Cycle 6 of Pembrolizumab  12/20/2022: Cycle 7 of Pembrolizumab  01/13/2023: Cycle 8 of Pembrolizumab   03/03/2023: Cystectomy/Prostatectomy showed infiltrative high-grade urothelial carcinoma, size 5.8 cm involving bladder dome, anterior wall and prostatic stroma. Tumor invades directly into prostatic stroma at apex and mid portion of the gland (pT4a). Metastatic urothelial cancer in one of two left common iliac lymph nodes.  06/19/2023-07/27/2023: Received adjuvant radiation to surgical bed.  He received 50.4 Gray in 28 fractions. 11/02/2023: Cycle 1 Day 1 of Enfortumab.  11/29/2023:  Cycle 2 Day 1 of Enfortumab.   CHIEF COMPLAINTS/PURPOSE OF CONSULTATION:  High Risk Non-Muscle Invasive Bladder Cancer   HISTORY OF PRESENTING ILLNESS:  Luis [name] 68 y.o. male with medical history significant for BCG unresponsive high  risk nonmuscle invasive bladder cancer who presents for a follow up visit.  He was last seen on 12/14/2023.  He presents today to continue Enfortumab treatment.   On exam today Luis [name] reports he has been quite fatigued in interim since her last visit and does spend a lot of time in bed.  He reports he is not having any lightheadedness, dizziness, or shortness of breath.  He reports over the holiday weekend he had trouble getting out of bed and spent most of his time resting.  He reports that he does not have any pain with exception of some occasional twinges of pain in his stomach where the urostomy was performed.  He has been also having some occasional nosebleeds which do start and stop but this typically stop with 1 tissue.  He reports his eyes have also been watery on this treatment.  He denies any nausea, vomiting, or diarrhea but does have low appetite.  He did have a biscuit will biscuit this morning but was unable to finish the whole thing.  He reports his urine is red on occasion with some occasional foul smell from the bag.  Overall he is willing and able to continue with treatment at this time.  He denies any fevers, chills, sweats.  Full 10 point ROS is otherwise negative.  MEDICAL HISTORY:  Past Medical History:  Diagnosis Date   Anxiety    takes Valium  as needed   Aortic stenosis, moderate    Arthritis    Ascending aortic aneurysm (HCC)    CAD (coronary artery disease)    a. s/p PCI of the RCA 8/12 with DES by Dr Wonda, preserved EF. b. LHC/RHC (2/16) with mean RA 12, PA 32/15, mean PCWP 18, CI 3.47; patent mid and distal RCA stents, 50-60% proximal stenosis small PDA.  Cancer [facility])    bladder   Carotid stenosis    a. Carotid US  (05/2013):  Bilateral 1-39% ICA; L thyroid  nodule (prior hx of aspiration).   Chronic diastolic CHF (congestive heart failure) (HCC)    Complication of anesthesia    difficulty waking up after gallbladder surgery   Depression    Dyspnea     Essential hypertension    GERD (gastroesophageal reflux disease)    if needed will take OTC meds    Heart murmur    History of colonic polyps    hyperplastic   Hyperlipidemia    Joint pain    Lesion of bladder    Myocardial infarction (HCC) 2012   Obesity (BMI 30-39.9) 02/29/2016   Pre-diabetes    Restless leg    Sleep apnea    uses cpap   Tubular adenoma of colon    Vertigo    takes Meclizine  as needed    SURGICAL HISTORY: Past Surgical History:  Procedure Laterality Date   AORTIC VALVE REPLACEMENT N/A 09/16/2021   Procedure: AORTIC VALVE REPLACEMENT (AVR);  Surgeon: Lucas Dorise POUR, MD;  Location: [facility] OR;  Service: Open Heart Surgery;  Laterality: N/A;   CATARACT EXTRACTION   4 YRS AGO   BOTH EYES   CHOLECYSTECTOMY  07/21/2011   Procedure: LAPAROSCOPIC CHOLECYSTECTOMY WITH INTRAOPERATIVE CHOLANGIOGRAM;  Surgeon: Alm VEAR Angle, MD;  Location: [facility] ORS;  Service: General;  Laterality: N/A;   CORONARY ANGIOPLASTY  2012   2 stents   coronary stenting     s/p PCI of the RCA by Dr Wonda 8/12 with 2 promus stents   CYSTOSCOPY W/ RETROGRADES Bilateral 12/13/2019   Procedure: CYSTOSCOPY WITH RETROGRADE PYELOGRAM;  Surgeon: Alvaro Hummer, MD;  Location: [facility];  Service: Urology;  Laterality: Bilateral;   CYSTOSCOPY W/ RETROGRADES Bilateral 10/14/2020   Procedure: CYSTOSCOPY WITH RETROGRADE PYELOGRAM;  Surgeon: Alvaro Hummer, MD;  Location: [facility];  Service: Urology;  Laterality: Bilateral;   CYSTOSCOPY W/ RETROGRADES Bilateral 12/16/2020   Procedure: CYSTOSCOPY WITH RETROGRADE PYELOGRAM;  Surgeon: Alvaro Hummer, MD;  Location: [facility];  Service: Urology;  Laterality: Bilateral;   CYSTOSCOPY W/ RETROGRADES Bilateral 06/03/2022   Procedure: CYSTOSCOPY WITH RETROGRADE PYELOGRAM;  Surgeon: Alvaro Hummer, MD;  Location: [facility];  Service: Urology;  Laterality: Bilateral;   CYSTOSCOPY W/ RETROGRADES Bilateral 12/28/2022   Procedure:  CYSTOSCOPY WITH RETROGRADE PYELOGRAM, FULGARATION OF BLEEDERS;  Surgeon: Alvaro Hummer KATHEE Mickey., MD;  Location: [facility];  Service: Urology;  Laterality: Bilateral;   CYSTOSCOPY WITH INJECTION N/A 03/03/2023   Procedure: CYSTOSCOPY WITH INDOCYANINE INJECTION;  Surgeon: Alvaro Hummer KATHEE Mickey., MD;  Location: [facility];  Service: Urology;  Laterality: N/A;  360 MINUTES   ESOPHAGOGASTRODUODENOSCOPY N/A 06/28/2023   Procedure: EGD (ESOPHAGOGASTRODUODENOSCOPY);  Surgeon: Charlanne Groom, MD;  Location: [facility];  Service: Gastroenterology;  Laterality: N/A;   IR IMAGING GUIDED PORT INSERTION  05/29/2023   IR NEPHROSTOMY EXCHANGE LEFT  08/07/2023   IR NEPHROSTOMY EXCHANGE LEFT  10/02/2023   IR NEPHROSTOMY EXCHANGE LEFT  11/27/2023   IR NEPHROSTOMY PLACEMENT RIGHT  07/01/2023   IR RADIOLOGY PERIPHERAL GUIDED IV START  07/01/2023   IR US  GUIDE VASC ACCESS LEFT  07/01/2023   LEFT AND RIGHT HEART CATHETERIZATION WITH CORONARY ANGIOGRAM N/A 06/23/2014   Procedure: LEFT AND RIGHT HEART CATHETERIZATION WITH CORONARY ANGIOGRAM;  Surgeon: Ezra GORMAN Shuck, MD;  Location: [facility] CATH LAB;  Service: Cardiovascular;  Laterality: N/A;   NECK SURGERY  03/23/09  per Dr. Barbarann, cervical fusion    PERICARDIOCENTESIS N/A 09/28/2021   Procedure: PERICARDIOCENTESIS;  Surgeon: Dann Candyce RAMAN, MD;  Location: [facility] INVASIVE CV LAB;  Service: Cardiovascular;  Laterality: N/A;   REPLACEMENT ASCENDING AORTA N/A 09/16/2021   Procedure: REPLACEMENT ASCENDING AORTA WITH 30 X HEMASHIELD PLATINUM WOVEN DOUBLE VELOUR VASCULAR GRAFT;  Surgeon: Lucas Dorise POUR, MD;  Location: [facility] OR;  Service: Open Heart Surgery;  Laterality: N/A;  CIRC ARREST   right elbow surgery     RIGHT HEART CATH N/A 06/15/2023   Procedure: RIGHT HEART CATH;  Surgeon: Rolan Ezra RAMAN, MD;  Location: [facility] INVASIVE CV LAB;  Service: Cardiovascular;  Laterality: N/A;   RIGHT HEART CATH AND CORONARY ANGIOGRAPHY N/A 07/08/2021   Procedure: RIGHT HEART CATH AND CORONARY ANGIOGRAPHY;   Surgeon: Rolan Ezra RAMAN, MD;  Location: [facility] INVASIVE CV LAB;  Service: Cardiovascular;  Laterality: N/A;   ROBOT ASSISTED LAPAROSCOPIC COMPLETE CYSTECT ILEAL CONDUIT N/A 03/03/2023   Procedure: XI ROBOTIC ASSISTED LAPAROSCOPIC COMPLETE CYSTECTECTOMY WITH  ILEAL CONDUIT DIVERSION;  Surgeon: Alvaro Ricardo KATHEE Mickey., MD;  Location: [facility];  Service: Urology;  Laterality: N/A;   ROBOT ASSISTED LAPAROSCOPIC RADICAL PROSTATECTOMY N/A 03/03/2023   Procedure: XI ROBOTIC ASSISTED LAPAROSCOPIC RADICAL PROSTATECTOMY WITH LYMPH NODE DISSECTION;  Surgeon: Alvaro Ricardo KATHEE Mickey., MD;  Location: [facility];  Service: Urology;  Laterality: N/A;   solonscopy  05/23/08   per Dr. Jakie inch hemorrhoids only, repeat in 5 years   SUBXYPHOID PERICARDIAL WINDOW N/A 09/28/2021   Procedure: SUBXYPHOID PERICARDIAL WINDOW;  Surgeon: Lucas Dorise POUR, MD;  Location: [facility];  Service: Thoracic;  Laterality: N/A;   TEE WITHOUT CARDIOVERSION N/A 06/23/2014   Procedure: TRANSESOPHAGEAL ECHOCARDIOGRAM (TEE);  Surgeon: Ezra RAMAN Rolan, MD;  Location: [facility] ENDOSCOPY;  Service: Cardiovascular;  Laterality: N/A;   TEE WITHOUT CARDIOVERSION N/A 01/21/2016   Procedure: TRANSESOPHAGEAL ECHOCARDIOGRAM (TEE);  Surgeon: Ezra RAMAN Rolan, MD;  Location: [facility] ENDOSCOPY;  Service: Cardiovascular;  Laterality: N/A;   TEE WITHOUT CARDIOVERSION N/A 09/16/2021   Procedure: TRANSESOPHAGEAL ECHOCARDIOGRAM (TEE);  Surgeon: Lucas Dorise POUR, MD;  Location: [facility] OR;  Service: Open Heart Surgery;  Laterality: N/A;   TEE WITHOUT CARDIOVERSION N/A 09/28/2021   Procedure: TRANSESOPHAGEAL ECHOCARDIOGRAM (TEE);  Surgeon: Lucas Dorise POUR, MD;  Location: [facility] OR;  Service: Thoracic;  Laterality: N/A;   TONSILLECTOMY     TRANSESOPHAGEAL ECHOCARDIOGRAM (CATH LAB) N/A 04/17/2023   Procedure: TRANSESOPHAGEAL ECHOCARDIOGRAM;  Surgeon: Jeffrie Oneil BROCKS, MD;  Location: [facility] INVASIVE CV LAB;  Service: Cardiovascular;  Laterality: N/A;   TRANSURETHRAL RESECTION OF BLADDER TUMOR N/A  10/21/2019   Procedure: TRANSURETHRAL RESECTION OF BLADDER TUMOR (TURBT);  Surgeon: Ottelin, Mark, MD;  Location: [facility];  Service: Urology;  Laterality: N/A;   TRANSURETHRAL RESECTION OF BLADDER TUMOR N/A 12/13/2019   Procedure: TRANSURETHRAL RESECTION OF BLADDER TUMOR (TURBT);  Surgeon: Alvaro Ricardo, MD;  Location: [facility];  Service: Urology;  Laterality: N/A;  1 HR   TRANSURETHRAL RESECTION OF BLADDER TUMOR N/A 10/14/2020   Procedure: TRANSURETHRAL RESECTION OF BLADDER TUMOR (TURBT);  Surgeon: Alvaro Ricardo, MD;  Location: [facility];  Service: Urology;  Laterality: N/A;   TRANSURETHRAL RESECTION OF BLADDER TUMOR N/A 12/16/2020   Procedure: RESTAGING TRANSURETHRAL RESECTION OF BLADDER TUMOR (TURBT);  Surgeon: Alvaro Ricardo, MD;  Location: [facility];  Service: Urology;  Laterality: N/A;   TRANSURETHRAL RESECTION OF BLADDER TUMOR N/A 06/03/2022   Procedure: TRANSURETHRAL RESECTION OF BLADDER TUMOR (TURBT);  Surgeon: Alvaro,  Ricardo, MD;  Location: [facility];  Service: Urology;  Laterality: N/A;   UMBILICAL HERNIA REPAIR  03/03/2023   Procedure: HERNIA REPAIR UMBILICAL;  Surgeon: Alvaro Ricardo KATHEE Mickey., MD;  Location: [facility];  Service: Urology;;    SOCIAL HISTORY: Social History   Socioeconomic History   Marital status: Married    Spouse name: Not on file   Number of children: 4   Years of education: Not on file   Highest education level: Not on file  Occupational History   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  Tobacco Use   Smoking status: Former    Current packs/day: 0.00    Average packs/day: 2.0 packs/day for 40.0 years (80.0 ttl pk-yrs)    Types: Cigarettes    Start date: 37    Quit date: 2012    Years since quitting: 13.6    Passive exposure: Never   Smokeless tobacco: Never  Vaping Use   Vaping status: Never Used  Substance and Sexual Activity   Alcohol use: No     Alcohol/week: 0.0 standard drinks of alcohol   Drug use: No   Sexual activity: Not on file  Other Topics Concern   Not on file  Social History Narrative   Not on file   Social Drivers of Health   Financial Resource Strain: Low Risk  (11/03/2022)   Overall Financial Resource Strain (CARDIA)    Difficulty of Paying Living Expenses: Not hard at all  Food Insecurity: No Food Insecurity (07/04/2023)   Hunger Vital Sign    Worried About Running Out of Food in the Last Year: Never true    Ran Out of Food in the Last Year: Never true  Transportation Needs: No Transportation Needs (07/04/2023)   PRAPARE - Administrator, Civil Service (Medical): No    Lack of Transportation (Non-Medical): No  Physical Activity: Unknown (11/03/2022)   Exercise Vital Sign    Days of Exercise per Week: Patient unable to answer    Minutes of Exercise per Session: 30 min  Stress: No Stress Concern Present (11/03/2022)   [employer] of Occupational Health - Occupational Stress Questionnaire    Feeling of Stress : Not at all  Social Connections: Moderately Isolated (06/27/2023)   Social Connection and Isolation Panel    Frequency of Communication with Friends and Family: More than three times a week    Frequency of Social Gatherings with Friends and Family: More than three times a week    Attends Religious Services: Never    Database administrator or Organizations: No    Attends Banker Meetings: Never    Marital Status: Married  Catering manager Violence: Not At Risk (07/04/2023)   Humiliation, Afraid, Rape, and Kick questionnaire    Fear of Current or Ex-Partner: No    Emotionally Abused: No    Physically Abused: No    Sexually Abused: No    FAMILY HISTORY: Family History  Problem Relation Age of Onset   Lung cancer Mother        lung   Esophageal cancer Cousin    Colon cancer Neg Hx    Rectal cancer Neg Hx    Stomach cancer Neg Hx     ALLERGIES:  has no known  allergies.  MEDICATIONS:  Current Outpatient Medications  Medication Sig Dispense Refill   acetaminophen  (TYLENOL ) 325 MG tablet Take 650 mg by mouth every 6 (six) hours as needed (for pain).  amLODipine  (NORVASC ) 5 MG tablet TAKE 1 TABLET (5 MG TOTAL) BY MOUTH DAILY. 90 tablet 1   aspirin  EC 81 MG tablet Take 81 mg by mouth in the morning.     carvedilol  (COREG ) 3.125 MG tablet TAKE 1 TABLET BY MOUTH TWICE A DAY WITH FOOD 180 tablet 1   cyanocobalamin  (VITAMIN B12) 1000 MCG tablet Take 1 tablet (1,000 mcg total) by mouth daily. 30 tablet 6   diazepam  (VALIUM ) 5 MG tablet TAKE 1 TABLET BY MOUTH EVERY 12 HOURS AS NEEDED FOR ANXIETY. 60 tablet 5   famotidine  (PEPCID ) 20 MG tablet Take 1 tablet (20 mg total) by mouth 2 (two) times daily for 6 days. (Patient taking differently: Take 20 mg by mouth 2 (two) times daily. As needed) 12 tablet 0   ferrous sulfate  325 (65 FE) MG tablet Take 1 tablet (325 mg total) by mouth daily with breakfast. Please take with a source of Vitamin C 90 tablet 3   furosemide  (LASIX ) 20 MG tablet Take 1 tablet (20 mg total) by mouth daily. 45 tablet 3   ipratropium (ATROVENT ) 0.03 % nasal spray Place 2 sprays into both nostrils every 12 (twelve) hours as needed for rhinitis.     lidocaine -prilocaine  (EMLA ) cream Apply topically once a week. 30 g 0   Magnesium  500 MG TABS Take 500 mg by mouth every morning.     meclizine  (ANTIVERT ) 25 MG tablet Take 1 tablet (25 mg total) by mouth 3 (three) times daily as needed for dizziness. 30 tablet 0   multivitamin-iron-minerals-folic acid  (CENTRUM) chewable tablet Chew 1 tablet by mouth daily.     nitroGLYCERIN  (NITROSTAT ) 0.4 MG SL tablet Place 0.4 mg under the tongue every 5 (five) minutes as needed for chest pain.     ondansetron  (ZOFRAN ) 8 MG tablet Take 1 tablet (8 mg total) by mouth every 8 (eight) hours as needed. 30 tablet 0   pantoprazole  (PROTONIX ) 40 MG tablet Take 1 tablet (40 mg total) by mouth 2 (two) times daily  before a meal. 60 tablet 5   potassium chloride  SA (KLOR-CON  M20) 20 MEQ tablet Take 1 tablet (20 mEq total) by mouth daily. (Patient taking differently: Take 20 mEq by mouth 2 (two) times daily.)     prochlorperazine  (COMPAZINE ) 10 MG tablet Take 1 tablet (10 mg total) by mouth every 6 (six) hours as needed for nausea or vomiting. 30 tablet 0   rosuvastatin  (CRESTOR ) 20 MG tablet Take 1 tablet (20 mg total) by mouth daily. 90 tablet 3   temazepam  (RESTORIL ) 30 MG capsule Take 1 capsule (30 mg total) by mouth at bedtime as needed for sleep. 90 capsule 1   traMADol  (ULTRAM ) 50 MG tablet Take 1 tablet (50 mg total) by mouth every 6 (six) hours as needed. 30 tablet 0   venlafaxine  XR (EFFEXOR -XR) 150 MG 24 hr capsule Take 1 capsule (150 mg total) by mouth daily with breakfast. 90 capsule 3   No current facility-administered medications for this visit.    REVIEW OF SYSTEMS:   Constitutional: ( - ) fevers, ( - )  chills , ( - ) night sweats Eyes: ( - ) blurriness of vision, ( - ) double vision, ( - ) watery eyes Ears, nose, mouth, throat, and face: ( - ) mucositis, ( - ) sore throat Respiratory: ( - ) cough, ( - ) dyspnea, ( - ) wheezes Cardiovascular: ( - ) palpitation, ( - ) chest discomfort, ( - ) lower extremity swelling Gastrointestinal:  ( - )  nausea, ( - ) heartburn, ( - ) change in bowel habits Skin: ( - ) abnormal skin rashes Lymphatics: ( - ) new lymphadenopathy, ( - ) easy bruising Neurological: ( - ) numbness, ( - ) tingling, ( - ) new weaknesses Behavioral/Psych: ( - ) mood change, ( - ) new changes  All other systems were reviewed with the patient and are negative.  PHYSICAL EXAMINATION: ECOG PERFORMANCE STATUS: 1 - Symptomatic but completely ambulatory  Vitals:   12/28/23 1005  BP: 139/76  Pulse: (!) 57  Resp: 14  Temp: 98.1 F (36.7 C)  SpO2: 99%     There were no vitals filed for this visit.    GENERAL: well appearing elderly Caucasian male in NAD  SKIN: skin  color, texture, turgor are normal, no rashes or significant lesions EYES: conjunctiva are pink and non-injected, sclera clear LUNGS: clear to auscultation and percussion with normal breathing effort HEART: regular rate & rhythm and no murmurs and no lower extremity edema Musculoskeletal: no cyanosis of digits and no clubbing.  PSYCH: alert & oriented x 3, fluent speech NEURO: no focal motor/sensory deficits  LABORATORY DATA:  I have reviewed the data as listed    Latest Ref Rng & Units 12/28/2023    9:43 AM 12/14/2023   10:01 AM 12/07/2023   12:42 PM  CBC  WBC 4.0 - 10.5 K/uL 4.9  5.7  4.8   Hemoglobin 13.0 - 17.0 g/dL 9.3  89.8  89.7   Hematocrit 39.0 - 52.0 % 28.1  29.8  30.1   Platelets 150 - 400 K/uL 167  158  186        Latest Ref Rng & Units 12/28/2023    9:43 AM 12/14/2023   10:01 AM 12/07/2023   12:42 PM  CMP  Glucose 70 - 99 mg/dL 831  797  823   BUN 8 - 23 mg/dL 20  16  24    Creatinine 0.61 - 1.24 mg/dL 8.17  8.42  8.35   Sodium 135 - 145 mmol/L 134  137  138   Potassium 3.5 - 5.1 mmol/L 3.8  3.6  3.9   Chloride 98 - 111 mmol/L 99  104  105   CO2 22 - 32 mmol/L 29  26  26    Calcium  8.9 - 10.3 mg/dL 8.4  8.5  8.4   Total Protein 6.5 - 8.1 g/dL 7.3  7.1  6.9   Total Bilirubin 0.0 - 1.2 mg/dL 0.9  0.8  0.6   Alkaline Phos 38 - 126 U/L 109  103  108   AST 15 - 41 U/L 30  43  34   ALT 0 - 44 U/L 28  31  26      RADIOGRAPHIC STUDIES: DG Knee 1-2 Views Right Result Date: 12/11/2023 CLINICAL DATA:  Pain. EXAM: RIGHT KNEE - 1-2 VIEW COMPARISON:  None Available. FINDINGS: Medial tibiofemoral joint space narrowing, near complete joint space loss. Probable ossified body posteriorly within a Baker cyst. Mild to moderate tricompartmental peripheral spurring. No fracture, erosion, or focal bone abnormality. Small joint effusion. Unremarkable soft tissues. IMPRESSION: 1. Tricompartmental osteoarthritis, most prominent in the medial tibiofemoral compartment. 2. Probable ossified body  posteriorly within a Baker cyst. Electronically Signed   By: Andrea Gasman M.D.   On: 12/11/2023 18:27   DG Lumbar Spine 2-3 Views Result Date: 12/11/2023 CLINICAL DATA:  Acute bilateral low back pain without sciatica. EXAM: LUMBAR SPINE - 2-3 VIEW COMPARISON:  None Available. FINDINGS: Five non-rib-bearing  lumbar vertebra. 6 mm anterolisthesis of L4 on L5. Trace retrolisthesis of L5 on S1. No evidence of fracture or compression deformity. He anterior spurring throughout the lumbar spine with disc space narrowing L5-S1. Moderate diffuse facet hypertrophy. No evidence of focal bone lesion or bone destruction. Bilateral nephrostomy tubes. IMPRESSION: 1. Multilevel degenerative disc disease and facet hypertrophy in the lumbar spine. 2. Grade 1 anterolisthesis of L4 on L5. Trace retrolisthesis of L5 on S1. Electronically Signed   By: Andrea Gasman M.D.   On: 12/11/2023 18:26     ASSESSMENT & PLAN ELIGE SHOUSE 68 y.o. 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  After review of the labs, review of the records, and discussion with the patient the patients findings are most consistent with BCG unresponsive high risk non-muscle invasive bladder cancer, not a candidate for cystectomy.  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 21 days until progression or intolerance up to 24 months. --Received 8 cycles of Pembrolizumab  on 08/12/2022-01/13/2023: Cycle 8 of Pembrolizumab   --On 03/03/2023, underwent Cystectomy/Prostatectomy showed infiltrative high-grade urothelial carcinoma, size 5.8 cm involving bladder dome, anterior wall and prostatic stroma. Tumor invades directly into prostatic stroma at apex and mid portion of the gland (pT4a). Metastatic urothelial cancer in one of two left common iliac lymph  nodes.  -- Given the results of the pathology showing positive margins and spread to the lymph node we are considering radiation therapy versus chemotherapy.  For chemotherapy, recommend gemcitabine  and cisplatin x 4 cycles.  This would consist of gemcitabine  1000 mg/m on days 1 and 8 and cisplatin 70 mg/m on day 1 of 21-day cycle for 4 cycles. -- Due to kidney dysfunction and anemia, chemotherapy was deferred and patient underwent adjuvant radiation to the surgical bed from 06/19/2023 until 07/27/2023.  He received 50.4 Gray in 28 fractions. PLAN:  --Labs from today show white blood cell 4.9, Hgb 9.3, MCV 92.4, Plt 167, LFTs within normal limits --CT scan due in early Oct 2025.  --today is Cycle 3 Day 1 of Enfortumab therapy  --RTC weekly for treatment and every 2 weeks for clinic visit.  # Skin Rash -- Occurred after receiving blood transfusion, etiology not entirely clear. -- Patient has been applying moisturizer to the skin and has been taking Benadryl . -- Skin is persistently dry and itchy and erythematous. -- previously prescribed steroid taper with 60 mg for 5 days, 40 for 5 days, and 20 for 5 days.  #Anemia --Hemoglobin 9.3 --May be multifactorial due to nutritional deficiency as well as kidney dysfunction. -- Nutritional labs checked today show borderline vitamin B12 deficiency and iron deficiency. -- Patient is currently taking vitamin B12 p.o. daily and ferrous sulfate  every other day. -- today will receive IV monoferric  and B12 IM injections to bolster levels.  #Hypokalemia: --Potassium level is 3.6 today --Patient is currently taking potassium supplement 20 meq BID. Advised to continue  #Flank pain: --Secondary to bilateral nephrostomy. Patient is currently taking tylenol  with minimal improvement.  --Sent prescription of tramadol  50 mg PO q 6 hours as needed.   # AKI -- Currently has bilateral nephrostomies, underwent exchange on 08/07/2023. --monitoring Cr at each visit.    #Supportive Care -- chemotherapy education complete -- port placed -- zofran  8mg  q8H PRN and compazine  10mg  PO q6H for nausea -- EMLA  cream for port --  no pain medication required at this time.    Orders Placed This Encounter  Procedures   CT CHEST ABDOMEN PELVIS W CONTRAST    Standing Status:   Future    Expected Date:   01/24/2024    Expiration Date:   12/27/2024    If indicated for the ordered procedure, I authorize the administration of contrast media per Radiology protocol:   Yes    Does the patient have a contrast media/X-ray dye allergy?:   No    Preferred imaging location?:   [facility]    If indicated for the ordered procedure, I authorize the administration of oral contrast media per Radiology protocol:   Yes   CBC with Differential (Cancer Center Only)    Standing Status:   Future    Expected Date:   01/25/2024    Expiration Date:   01/24/2025   CMP (Cancer Center only)    Standing Status:   Future    Expected Date:   01/25/2024    Expiration Date:   01/24/2025   CBC with Differential (Cancer Center Only)    Standing Status:   Future    Expected Date:   02/01/2024    Expiration Date:   01/31/2025   CMP (Cancer Center only)    Standing Status:   Future    Expected Date:   02/01/2024    Expiration Date:   01/31/2025   CBC with Differential (Cancer Center Only)    Standing Status:   Future    Expected Date:   02/08/2024    Expiration Date:   02/07/2025   CMP (Cancer Center only)    Standing Status:   Future    Expected Date:   02/08/2024    Expiration Date:   02/07/2025   CBC with Differential (Cancer Center Only)    Standing Status:   Future    Expected Date:   02/22/2024    Expiration Date:   02/21/2025   CMP (Cancer Center only)    Standing Status:   Future    Expected Date:   02/22/2024    Expiration Date:   02/21/2025   CBC with Differential (Cancer Center Only)    Standing Status:   Future    Expected Date:   02/29/2024    Expiration Date:    02/28/2025   CMP (Cancer Center only)    Standing Status:   Future    Expected Date:   02/29/2024    Expiration Date:   02/28/2025   CBC with Differential (Cancer Center Only)    Standing Status:   Future    Expected Date:   03/07/2024    Expiration Date:   03/07/2025   CMP (Cancer Center only)    Standing Status:   Future    Expected Date:   03/07/2024    Expiration Date:   03/07/2025   CBC with Differential (Cancer Center Only)    Standing Status:   Future    Expected Date:   03/21/2024    Expiration Date:   03/21/2025   CMP (Cancer Center only)    Standing Status:   Future    Expected Date:   03/21/2024    Expiration Date:   03/21/2025   CBC with Differential (Cancer Center Only)    Standing Status:   Future    Expected Date:   03/28/2024    Expiration Date:   03/28/2025   CMP (Cancer Center only)    Standing Status:   Future  Expected Date:   03/28/2024    Expiration Date:   03/28/2025   CBC with Differential (Cancer Center Only)    Standing Status:   Future    Expected Date:   04/04/2024    Expiration Date:   04/04/2025   CMP (Cancer Center only)    Standing Status:   Future    Expected Date:   04/04/2024    Expiration Date:   04/04/2025   CBC with Differential (Cancer Center Only)    Standing Status:   Future    Expected Date:   04/18/2024    Expiration Date:   04/18/2025   CMP (Cancer Center only)    Standing Status:   Future    Expected Date:   04/18/2024    Expiration Date:   04/18/2025   CBC with Differential (Cancer Center Only)    Standing Status:   Future    Expected Date:   04/25/2024    Expiration Date:   04/25/2025   CMP (Cancer Center only)    Standing Status:   Future    Expected Date:   04/25/2024    Expiration Date:   04/25/2025   CBC with Differential (Cancer Center Only)    Standing Status:   Future    Expected Date:   05/02/2024    Expiration Date:   05/02/2025   CMP (Cancer Center only)    Standing Status:   Future    Expected Date:   05/02/2024     Expiration Date:   05/02/2025   All questions were answered. The patient knows to call the clinic with any problems, questions or concerns.  I have spent a total of 30 minutes minutes of face-to-face and non-face-to-face time, preparing to see the patient, performing a medically appropriate examination, counseling and educating the patient, documenting clinical information in the electronic health record,and care coordination.   Norleen IVAR Kidney, MD Department of Hematology/Oncology [facility] Phone: [phone] Pager: [phone] Email: [email or web address]

## 2023-12-28 NOTE — Patient Instructions (Signed)
 [facility] - A DEPT OF [facility]  Discharge Instructions: Thank you for choosing [facility] to provide your oncology and hematology care.   If you have a lab appointment with the Cancer Center, please go directly to the Cancer Center and check in at the registration area.   Wear comfortable clothing and clothing appropriate for easy access to any Portacath or PICC line.   We strive to give you quality time with your provider. You may need to reschedule your appointment if you arrive late (15 or more minutes).  Arriving late affects you and other patients whose appointments are after yours.  Also, if you miss three or more appointments without notifying the office, you may be dismissed from the clinic at the provider's discretion.      For prescription refill requests, have your pharmacy contact our office and allow 72 hours for refills to be completed.    Today you received the following chemotherapy and/or immunotherapy agents: Padcev.      To help prevent nausea and vomiting after your treatment, we encourage you to take your nausea medication as directed.  BELOW ARE SYMPTOMS THAT SHOULD BE REPORTED IMMEDIATELY: *FEVER GREATER THAN 100.4 F (38 C) OR HIGHER *CHILLS OR SWEATING *NAUSEA AND VOMITING THAT IS NOT CONTROLLED WITH YOUR NAUSEA MEDICATION *UNUSUAL SHORTNESS OF BREATH *UNUSUAL BRUISING OR BLEEDING *URINARY PROBLEMS (pain or burning when urinating, or frequent urination) *BOWEL PROBLEMS (unusual diarrhea, constipation, pain near the anus) TENDERNESS IN MOUTH AND THROAT WITH OR WITHOUT PRESENCE OF ULCERS (sore throat, sores in mouth, or a toothache) UNUSUAL RASH, SWELLING OR PAIN  UNUSUAL VAGINAL DISCHARGE OR ITCHING   Items with * indicate a potential emergency and should be followed up as soon as possible or go to the Emergency Department if any problems should occur.  Please show the CHEMOTHERAPY ALERT CARD or IMMUNOTHERAPY  ALERT CARD at check-in to the Emergency Department and triage nurse.  Should you have questions after your visit or need to cancel or reschedule your appointment, please contact [facility] - A DEPT OF [facility]  Dept: [phone]  and follow the prompts.  Office hours are 8:00 a.m. to 4:30 p.m. Monday - Friday. Please note that voicemails left after 4:00 p.m. may not be returned until the following business day.  We are closed weekends and major holidays. You have access to a nurse at all times for urgent questions. Please call the main number to the clinic Dept: [phone] and follow the prompts.   For any non-urgent questions, you may also contact your provider using MyChart. We now offer e-Visits for anyone 35 and older to request care online for non-urgent symptoms. For details visit [email or web address].   Also download the MyChart app! Go to the app store, search "MyChart", open the app, select [place], and log in with your MyChart username and password.

## 2023-12-28 NOTE — Progress Notes (Signed)
 Updated creatinine hold parameters in tx plan per Dr. Federico.  Harlene Nasuti, PharmD Oncology Infusion Pharmacist 12/28/2023 12:04 PM

## 2023-12-29 ENCOUNTER — Other Ambulatory Visit: Payer: Self-pay

## 2024-01-04 ENCOUNTER — Inpatient Hospital Stay

## 2024-01-04 ENCOUNTER — Encounter: Payer: Self-pay | Admitting: Hematology and Oncology

## 2024-01-04 ENCOUNTER — Other Ambulatory Visit

## 2024-01-04 VITALS — BP 159/69 | HR 65 | Temp 98.5°F | Resp 16 | Wt 241.5 lb

## 2024-01-04 DIAGNOSIS — C679 Malignant neoplasm of bladder, unspecified: Secondary | ICD-10-CM

## 2024-01-04 DIAGNOSIS — Z5112 Encounter for antineoplastic immunotherapy: Secondary | ICD-10-CM | POA: Diagnosis not present

## 2024-01-04 DIAGNOSIS — Z95828 Presence of other vascular implants and grafts: Secondary | ICD-10-CM

## 2024-01-04 LAB — CBC WITH DIFFERENTIAL (CANCER CENTER ONLY)
Abs Immature Granulocytes: 0.02 K/uL (ref 0.00–0.07)
Basophils Absolute: 0 K/uL (ref 0.0–0.1)
Basophils Relative: 0 %
Eosinophils Absolute: 0.1 K/uL (ref 0.0–0.5)
Eosinophils Relative: 1 %
HCT: 28.8 % — ABNORMAL LOW (ref 39.0–52.0)
Hemoglobin: 9.7 g/dL — ABNORMAL LOW (ref 13.0–17.0)
Immature Granulocytes: 0 %
Lymphocytes Relative: 10 %
Lymphs Abs: 0.5 K/uL — ABNORMAL LOW (ref 0.7–4.0)
MCH: 30.3 pg (ref 26.0–34.0)
MCHC: 33.7 g/dL (ref 30.0–36.0)
MCV: 90 fL (ref 80.0–100.0)
Monocytes Absolute: 0.5 K/uL (ref 0.1–1.0)
Monocytes Relative: 10 %
Neutro Abs: 4.2 K/uL (ref 1.7–7.7)
Neutrophils Relative %: 79 %
Platelet Count: 187 K/uL (ref 150–400)
RBC: 3.2 MIL/uL — ABNORMAL LOW (ref 4.22–5.81)
RDW: 17.5 % — ABNORMAL HIGH (ref 11.5–15.5)
WBC Count: 5.3 K/uL (ref 4.0–10.5)
nRBC: 0 % (ref 0.0–0.2)

## 2024-01-04 LAB — CMP (CANCER CENTER ONLY)
ALT: 24 U/L (ref 0–44)
AST: 32 U/L (ref 15–41)
Albumin: 3.8 g/dL (ref 3.5–5.0)
Alkaline Phosphatase: 118 U/L (ref 38–126)
Anion gap: 8 (ref 5–15)
BUN: 20 mg/dL (ref 8–23)
CO2: 26 mmol/L (ref 22–32)
Calcium: 8.9 mg/dL (ref 8.9–10.3)
Chloride: 101 mmol/L (ref 98–111)
Creatinine: 1.96 mg/dL — ABNORMAL HIGH (ref 0.61–1.24)
GFR, Estimated: 36 mL/min — ABNORMAL LOW (ref >60)
Glucose, Bld: 190 mg/dL — ABNORMAL HIGH (ref 70–99)
Potassium: 3.8 mmol/L (ref 3.5–5.1)
Sodium: 135 mmol/L (ref 135–145)
Total Bilirubin: 0.9 mg/dL (ref 0.0–1.2)
Total Protein: 7.9 g/dL (ref 6.5–8.1)

## 2024-01-04 MED ORDER — CYANOCOBALAMIN 1000 MCG/ML IJ SOLN: 1000.0000 ug | Freq: Once | INTRAMUSCULAR | Status: DC

## 2024-01-04 MED ORDER — SODIUM CHLORIDE 0.9 % IV SOLN: INTRAVENOUS | Status: DC

## 2024-01-04 MED ORDER — SODIUM CHLORIDE 0.9 % IV SOLN
125.0000 mg | Freq: Once | INTRAVENOUS | Status: AC
  Administered 2024-01-04: 125 mg via INTRAVENOUS
  Filled 2024-01-04: qty 6

## 2024-01-04 MED ORDER — CYANOCOBALAMIN 1000 MCG/ML IJ SOLN
1000.0000 ug | Freq: Once | INTRAMUSCULAR | Status: AC
  Administered 2024-01-04: 1000 ug via INTRAMUSCULAR
  Filled 2024-01-04: qty 1

## 2024-01-04 MED ORDER — PROCHLORPERAZINE MALEATE 10 MG PO TABS
10.0000 mg | ORAL_TABLET | Freq: Once | ORAL | Status: AC
  Administered 2024-01-04: 10 mg via ORAL
  Filled 2024-01-04: qty 1

## 2024-01-04 NOTE — Patient Instructions (Signed)
 [facility] - A DEPT OF [facility]  Discharge Instructions: Thank you for choosing [facility] to provide your oncology and hematology care.   If you have a lab appointment with the Cancer Center, please go directly to the Cancer Center and check in at the registration area.   Wear comfortable clothing and clothing appropriate for easy access to any Portacath or PICC line.   We strive to give you quality time with your provider. You may need to reschedule your appointment if you arrive late (15 or more minutes).  Arriving late affects you and other patients whose appointments are after yours.  Also, if you miss three or more appointments without notifying the office, you may be dismissed from the clinic at the provider's discretion.      For prescription refill requests, have your pharmacy contact our office and allow 72 hours for refills to be completed.    Today you received the following chemotherapy and/or immunotherapy agents: Padcev.      To help prevent nausea and vomiting after your treatment, we encourage you to take your nausea medication as directed.  BELOW ARE SYMPTOMS THAT SHOULD BE REPORTED IMMEDIATELY: *FEVER GREATER THAN 100.4 F (38 C) OR HIGHER *CHILLS OR SWEATING *NAUSEA AND VOMITING THAT IS NOT CONTROLLED WITH YOUR NAUSEA MEDICATION *UNUSUAL SHORTNESS OF BREATH *UNUSUAL BRUISING OR BLEEDING *URINARY PROBLEMS (pain or burning when urinating, or frequent urination) *BOWEL PROBLEMS (unusual diarrhea, constipation, pain near the anus) TENDERNESS IN MOUTH AND THROAT WITH OR WITHOUT PRESENCE OF ULCERS (sore throat, sores in mouth, or a toothache) UNUSUAL RASH, SWELLING OR PAIN  UNUSUAL VAGINAL DISCHARGE OR ITCHING   Items with * indicate a potential emergency and should be followed up as soon as possible or go to the Emergency Department if any problems should occur.  Please show the CHEMOTHERAPY ALERT CARD or IMMUNOTHERAPY  ALERT CARD at check-in to the Emergency Department and triage nurse.  Should you have questions after your visit or need to cancel or reschedule your appointment, please contact [facility] - A DEPT OF [facility]  Dept: [phone]  and follow the prompts.  Office hours are 8:00 a.m. to 4:30 p.m. Monday - Friday. Please note that voicemails left after 4:00 p.m. may not be returned until the following business day.  We are closed weekends and major holidays. You have access to a nurse at all times for urgent questions. Please call the main number to the clinic Dept: [phone] and follow the prompts.   For any non-urgent questions, you may also contact your provider using MyChart. We now offer e-Visits for anyone 35 and older to request care online for non-urgent symptoms. For details visit [email or web address].   Also download the MyChart app! Go to the app store, search "MyChart", open the app, select [place], and log in with your MyChart username and password.

## 2024-01-09 ENCOUNTER — Other Ambulatory Visit (HOSPITAL_COMMUNITY)

## 2024-01-10 ENCOUNTER — Other Ambulatory Visit (HOSPITAL_COMMUNITY): Payer: Self-pay | Admitting: Radiology

## 2024-01-10 ENCOUNTER — Ambulatory Visit (HOSPITAL_COMMUNITY)
Admission: RE | Admit: 2024-01-10 | Discharge: 2024-01-10 | Disposition: A | Discharge status: Home / Self Care | Source: Ambulatory Visit | Attending: Diagnostic Radiology | Admitting: Diagnostic Radiology

## 2024-01-10 DIAGNOSIS — Z936 Other artificial openings of urinary tract status: Secondary | ICD-10-CM

## 2024-01-10 DIAGNOSIS — Z436 Encounter for attention to other artificial openings of urinary tract: Secondary | ICD-10-CM | POA: Insufficient documentation

## 2024-01-10 HISTORY — PX: IR NEPHROSTOMY EXCHANGE LEFT: IMG6069

## 2024-01-10 MED ORDER — LIDOCAINE HCL 1 % IJ SOLN
20.0000 mL | Freq: Once | INTRAMUSCULAR | Status: AC
  Administered 2024-01-10: 2 mL via INTRADERMAL

## 2024-01-10 MED ORDER — IOHEXOL 300 MG/ML  SOLN
50.0000 mL | Freq: Once | INTRAMUSCULAR | Status: AC | PRN
  Administered 2024-01-10: 20 mL

## 2024-01-10 MED ORDER — LIDOCAINE HCL 1 % IJ SOLN
INTRAMUSCULAR | Status: AC
  Filled 2024-01-10: qty 20

## 2024-01-10 NOTE — Procedures (Signed)
 Pre procedural Dx: Bilateral PCN exchange Post procedural Dx: Same  Technically successful fluoro guided placed of a 10 Fr drainage cathete exchange    EBL: Trace Complications: None immediate  KANDICE Banner, MD Pager #: [phone]

## 2024-01-11 ENCOUNTER — Other Ambulatory Visit

## 2024-01-11 ENCOUNTER — Inpatient Hospital Stay (HOSPITAL_BASED_OUTPATIENT_CLINIC_OR_DEPARTMENT_OTHER): Admitting: Physician Assistant

## 2024-01-11 ENCOUNTER — Inpatient Hospital Stay: Admitting: Dietician

## 2024-01-11 ENCOUNTER — Inpatient Hospital Stay

## 2024-01-11 VITALS — BP 143/79 | HR 61 | Temp 97.6°F | Resp 13 | Wt 244.0 lb

## 2024-01-11 DIAGNOSIS — C679 Malignant neoplasm of bladder, unspecified: Secondary | ICD-10-CM | POA: Diagnosis not present

## 2024-01-11 DIAGNOSIS — Z5112 Encounter for antineoplastic immunotherapy: Secondary | ICD-10-CM

## 2024-01-11 LAB — CMP (CANCER CENTER ONLY)
ALT: 23 U/L (ref 0–44)
AST: 32 U/L (ref 15–41)
Albumin: 3.6 g/dL (ref 3.5–5.0)
Alkaline Phosphatase: 114 U/L (ref 38–126)
Anion gap: 6 (ref 5–15)
BUN: 17 mg/dL (ref 8–23)
CO2: 26 mmol/L (ref 22–32)
Calcium: 8.5 mg/dL — ABNORMAL LOW (ref 8.9–10.3)
Chloride: 104 mmol/L (ref 98–111)
Creatinine: 1.73 mg/dL — ABNORMAL HIGH (ref 0.61–1.24)
GFR, Estimated: 42 mL/min — ABNORMAL LOW (ref >60)
Glucose, Bld: 141 mg/dL — ABNORMAL HIGH (ref 70–99)
Potassium: 4 mmol/L (ref 3.5–5.1)
Sodium: 136 mmol/L (ref 135–145)
Total Bilirubin: 0.8 mg/dL (ref 0.0–1.2)
Total Protein: 7.4 g/dL (ref 6.5–8.1)

## 2024-01-11 LAB — CBC WITH DIFFERENTIAL (CANCER CENTER ONLY)
Abs Immature Granulocytes: 0.03 K/uL (ref 0.00–0.07)
Basophils Absolute: 0 K/uL (ref 0.0–0.1)
Basophils Relative: 0 %
Eosinophils Absolute: 0.2 K/uL (ref 0.0–0.5)
Eosinophils Relative: 3 %
HCT: 26.6 % — ABNORMAL LOW (ref 39.0–52.0)
Hemoglobin: 8.9 g/dL — ABNORMAL LOW (ref 13.0–17.0)
Immature Granulocytes: 1 %
Lymphocytes Relative: 11 %
Lymphs Abs: 0.7 K/uL (ref 0.7–4.0)
MCH: 30.4 pg (ref 26.0–34.0)
MCHC: 33.5 g/dL (ref 30.0–36.0)
MCV: 90.8 fL (ref 80.0–100.0)
Monocytes Absolute: 0.8 K/uL (ref 0.1–1.0)
Monocytes Relative: 13 %
Neutro Abs: 4.5 K/uL (ref 1.7–7.7)
Neutrophils Relative %: 72 %
Platelet Count: 166 K/uL (ref 150–400)
RBC: 2.93 MIL/uL — ABNORMAL LOW (ref 4.22–5.81)
RDW: 18 % — ABNORMAL HIGH (ref 11.5–15.5)
WBC Count: 6.2 K/uL (ref 4.0–10.5)
nRBC: 0 % (ref 0.0–0.2)

## 2024-01-11 MED ORDER — SODIUM CHLORIDE 0.9 % IV SOLN
125.0000 mg | Freq: Once | INTRAVENOUS | Status: AC
  Administered 2024-01-11: 125 mg via INTRAVENOUS
  Filled 2024-01-11: qty 8.5

## 2024-01-11 MED ORDER — PROCHLORPERAZINE MALEATE 10 MG PO TABS
10.0000 mg | ORAL_TABLET | Freq: Once | ORAL | Status: AC
  Administered 2024-01-11: 10 mg via ORAL
  Filled 2024-01-11: qty 1

## 2024-01-11 MED ORDER — SODIUM CHLORIDE 0.9 % IV SOLN: INTRAVENOUS | Status: DC

## 2024-01-11 NOTE — Patient Instructions (Signed)
 [facility] - A DEPT OF [facility]  Discharge Instructions: Thank you for choosing [facility] to provide your oncology and hematology care.   If you have a lab appointment with the Cancer Center, please go directly to the Cancer Center and check in at the registration area.   Wear comfortable clothing and clothing appropriate for easy access to any Portacath or PICC line.   We strive to give you quality time with your provider. You may need to reschedule your appointment if you arrive late (15 or more minutes).  Arriving late affects you and other patients whose appointments are after yours.  Also, if you miss three or more appointments without notifying the office, you may be dismissed from the clinic at the provider's discretion.      For prescription refill requests, have your pharmacy contact our office and allow 72 hours for refills to be completed.    Today you received the following chemotherapy and/or immunotherapy agents: Enfortumab vedotin -ejfv (Padcev )    To help prevent nausea and vomiting after your treatment, we encourage you to take your nausea medication as directed.  BELOW ARE SYMPTOMS THAT SHOULD BE REPORTED IMMEDIATELY: *FEVER GREATER THAN 100.4 F (38 C) OR HIGHER *CHILLS OR SWEATING *NAUSEA AND VOMITING THAT IS NOT CONTROLLED WITH YOUR NAUSEA MEDICATION *UNUSUAL SHORTNESS OF BREATH *UNUSUAL BRUISING OR BLEEDING *URINARY PROBLEMS (pain or burning when urinating, or frequent urination) *BOWEL PROBLEMS (unusual diarrhea, constipation, pain near the anus) TENDERNESS IN MOUTH AND THROAT WITH OR WITHOUT PRESENCE OF ULCERS (sore throat, sores in mouth, or a toothache) UNUSUAL RASH, SWELLING OR PAIN  UNUSUAL VAGINAL DISCHARGE OR ITCHING   Items with * indicate a potential emergency and should be followed up as soon as possible or go to the Emergency Department if any problems should occur.  Please show the CHEMOTHERAPY ALERT  CARD or IMMUNOTHERAPY ALERT CARD at check-in to the Emergency Department and triage nurse.  Should you have questions after your visit or need to cancel or reschedule your appointment, please contact [facility] - A DEPT OF [facility]  Dept: [phone]  and follow the prompts.  Office hours are 8:00 a.m. to 4:30 p.m. Monday - Friday. Please note that voicemails left after 4:00 p.m. may not be returned until the following business day.  We are closed weekends and major holidays. You have access to a nurse at all times for urgent questions. Please call the main number to the clinic Dept: [phone] and follow the prompts.   For any non-urgent questions, you may also contact your provider using MyChart. We now offer e-Visits for anyone 39 and older to request care online for non-urgent symptoms. For details visit [email or web address].   Also download the MyChart app! Go to the app store, search MyChart, open the app, select [facility], and log in with your MyChart username and password.

## 2024-01-11 NOTE — Progress Notes (Signed)
 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 Federico  08/12/2022: Cycle 1 of Pembrolizumab  09/02/2022: Cycle 2 of Pembrolizumab   09/23/2022: Cycle 3 of Pembrolizumab  10/14/2022: Cycle 4 of Pembrolizumab  11/04/2022: Cycle 5 of Pembrolizumab  11/25/2022: Cycle 6 of Pembrolizumab  12/20/2022: Cycle 7 of Pembrolizumab  01/13/2023: Cycle 8 of Pembrolizumab   03/03/2023: Cystectomy/Prostatectomy showed infiltrative high-grade urothelial carcinoma, size 5.8 cm involving bladder dome, anterior wall and prostatic stroma. Tumor invades directly into prostatic stroma at apex and mid portion of the gland (pT4a). Metastatic urothelial cancer in one of two left common iliac lymph nodes.  06/19/2023-07/27/2023: Received adjuvant radiation to surgical bed.  He received 50.4 Gray in 28 fractions. 11/02/2023: Cycle 1 Day 1 of Enfortumab.  11/29/2023:  Cycle 2 Day 1 of Enfortumab.  12/28/2023: Cycle 3 Day 1 of Enfortumab.   CHIEF COMPLAINTS/PURPOSE OF CONSULTATION:  High Risk Non-Muscle Invasive Bladder Cancer   HISTORY OF PRESENTING ILLNESS:  Luis [name] 68 y.o. male with medical  history significant for BCG unresponsive high risk nonmuscle invasive bladder cancer who presents for a follow up visit.  He was last seen on 12/28/2023.  He presents today to continue Enfortumab treatment.   On exam today Luis [name] reports energy levels are overall stable. He has noticed decreased appetite but weight has been stable. He denies nausea, vomiting or bowel habit changes. He reports having some abdominal discomfort that improves after taking antinausea meds. He denies easy bruising or signs of bleeding at this time. He did have  a couple of episodes of nosebleeds recently. He has stable shortness of breath with exertion but none at rest. He denies fevers, chills, sweats, chest pain or cough. Overall he is willing and able to continue with treatment at this time.  Full 10 point ROS is otherwise negative.  MEDICAL HISTORY:  Past Medical History:  Diagnosis Date   Anxiety    takes Valium  as needed   Aortic stenosis, moderate    Arthritis    Ascending aortic aneurysm (HCC)    CAD (coronary artery disease)    a. s/p PCI of the RCA 8/12 with DES by Dr Wonda, preserved EF. b. LHC/RHC (2/16) with mean RA 12, PA 32/15, mean PCWP 18, CI 3.47; patent mid and distal RCA stents, 50-60% proximal stenosis small PDA.      Cancer [facility])    bladder   Carotid stenosis    a. Carotid US  (05/2013):  Bilateral 1-39% ICA; L thyroid  nodule (prior hx of aspiration).   Chronic diastolic CHF (congestive heart failure) (HCC)    Complication of anesthesia    difficulty waking up after gallbladder surgery   Depression    Dyspnea    Essential hypertension    GERD (gastroesophageal reflux disease)    if needed will take OTC meds  Heart murmur    History of colonic polyps    hyperplastic   Hyperlipidemia    Joint pain    Lesion of bladder    Myocardial infarction (HCC) 2012   Obesity (BMI 30-39.9) 02/29/2016   Pre-diabetes    Restless leg    Sleep apnea    uses cpap   Tubular adenoma of colon     Vertigo    takes Meclizine  as needed    SURGICAL HISTORY: Past Surgical History:  Procedure Laterality Date   AORTIC VALVE REPLACEMENT N/A 09/16/2021   Procedure: AORTIC VALVE REPLACEMENT (AVR);  Surgeon: Lucas Dorise POUR, MD;  Location: [facility] OR;  Service: Open Heart Surgery;  Laterality: N/A;   CATARACT EXTRACTION   4 YRS AGO   BOTH EYES   CHOLECYSTECTOMY  07/21/2011   Procedure: LAPAROSCOPIC CHOLECYSTECTOMY WITH INTRAOPERATIVE CHOLANGIOGRAM;  Surgeon: Alm VEAR Angle, MD;  Location: [facility] ORS;  Service: General;  Laterality: N/A;   CORONARY ANGIOPLASTY  2012   2 stents   coronary stenting     s/p PCI of the RCA by Dr Wonda 8/12 with 2 promus stents   CYSTOSCOPY W/ RETROGRADES Bilateral 12/13/2019   Procedure: CYSTOSCOPY WITH RETROGRADE PYELOGRAM;  Surgeon: Alvaro Hummer, MD;  Location: [facility];  Service: Urology;  Laterality: Bilateral;   CYSTOSCOPY W/ RETROGRADES Bilateral 10/14/2020   Procedure: CYSTOSCOPY WITH RETROGRADE PYELOGRAM;  Surgeon: Alvaro Hummer, MD;  Location: [facility];  Service: Urology;  Laterality: Bilateral;   CYSTOSCOPY W/ RETROGRADES Bilateral 12/16/2020   Procedure: CYSTOSCOPY WITH RETROGRADE PYELOGRAM;  Surgeon: Alvaro Hummer, MD;  Location: [facility];  Service: Urology;  Laterality: Bilateral;   CYSTOSCOPY W/ RETROGRADES Bilateral 06/03/2022   Procedure: CYSTOSCOPY WITH RETROGRADE PYELOGRAM;  Surgeon: Alvaro Hummer, MD;  Location: [facility];  Service: Urology;  Laterality: Bilateral;   CYSTOSCOPY W/ RETROGRADES Bilateral 12/28/2022   Procedure: CYSTOSCOPY WITH RETROGRADE PYELOGRAM, FULGARATION OF BLEEDERS;  Surgeon: Alvaro Hummer KATHEE Mickey., MD;  Location: [facility];  Service: Urology;  Laterality: Bilateral;   CYSTOSCOPY WITH INJECTION N/A 03/03/2023   Procedure: CYSTOSCOPY WITH INDOCYANINE INJECTION;  Surgeon: Alvaro Hummer KATHEE Mickey., MD;  Location: [facility];  Service: Urology;  Laterality: N/A;  360 MINUTES    ESOPHAGOGASTRODUODENOSCOPY N/A 06/28/2023   Procedure: EGD (ESOPHAGOGASTRODUODENOSCOPY);  Surgeon: Charlanne Groom, MD;  Location: [facility];  Service: Gastroenterology;  Laterality: N/A;   IR IMAGING GUIDED PORT INSERTION  05/29/2023   IR NEPHROSTOMY EXCHANGE LEFT  08/07/2023   IR NEPHROSTOMY EXCHANGE LEFT  10/02/2023   IR NEPHROSTOMY EXCHANGE LEFT  11/27/2023   IR NEPHROSTOMY EXCHANGE LEFT  01/10/2024   IR NEPHROSTOMY PLACEMENT RIGHT  07/01/2023   IR RADIOLOGY PERIPHERAL GUIDED IV START  07/01/2023   IR US  GUIDE VASC ACCESS LEFT  07/01/2023   LEFT AND RIGHT HEART CATHETERIZATION WITH CORONARY ANGIOGRAM N/A 06/23/2014   Procedure: LEFT AND RIGHT HEART CATHETERIZATION WITH CORONARY ANGIOGRAM;  Surgeon: Ezra GORMAN Shuck, MD;  Location: [facility] CATH LAB;  Service: Cardiovascular;  Laterality: N/A;   NECK SURGERY  03/23/09   per Dr. Barbarann, cervical fusion    PERICARDIOCENTESIS N/A 09/28/2021   Procedure: PERICARDIOCENTESIS;  Surgeon: Dann Candyce GORMAN, MD;  Location: [facility] INVASIVE CV LAB;  Service: Cardiovascular;  Laterality: N/A;   REPLACEMENT ASCENDING AORTA N/A 09/16/2021   Procedure: REPLACEMENT ASCENDING AORTA WITH 30 X HEMASHIELD PLATINUM WOVEN DOUBLE VELOUR VASCULAR GRAFT;  Surgeon: Lucas Dorise POUR, MD;  Location: [facility] OR;  Service: Open Heart Surgery;  Laterality:  N/A;  CIRC ARREST   right elbow surgery     RIGHT HEART CATH N/A 06/15/2023   Procedure: RIGHT HEART CATH;  Surgeon: Rolan Ezra RAMAN, MD;  Location: [facility] INVASIVE CV LAB;  Service: Cardiovascular;  Laterality: N/A;   RIGHT HEART CATH AND CORONARY ANGIOGRAPHY N/A 07/08/2021   Procedure: RIGHT HEART CATH AND CORONARY ANGIOGRAPHY;  Surgeon: Rolan Ezra RAMAN, MD;  Location: [facility] INVASIVE CV LAB;  Service: Cardiovascular;  Laterality: N/A;   ROBOT ASSISTED LAPAROSCOPIC COMPLETE CYSTECT ILEAL CONDUIT N/A 03/03/2023   Procedure: XI ROBOTIC ASSISTED LAPAROSCOPIC COMPLETE CYSTECTECTOMY WITH  ILEAL CONDUIT DIVERSION;  Surgeon: Alvaro Ricardo KATHEE Mickey., MD;   Location: [facility];  Service: Urology;  Laterality: N/A;   ROBOT ASSISTED LAPAROSCOPIC RADICAL PROSTATECTOMY N/A 03/03/2023   Procedure: XI ROBOTIC ASSISTED LAPAROSCOPIC RADICAL PROSTATECTOMY WITH LYMPH NODE DISSECTION;  Surgeon: Alvaro Ricardo KATHEE Mickey., MD;  Location: [facility];  Service: Urology;  Laterality: N/A;   solonscopy  05/23/08   per Dr. Jakie inch hemorrhoids only, repeat in 5 years   SUBXYPHOID PERICARDIAL WINDOW N/A 09/28/2021   Procedure: SUBXYPHOID PERICARDIAL WINDOW;  Surgeon: Lucas Dorise POUR, MD;  Location: [facility] OR;  Service: Thoracic;  Laterality: N/A;   TEE WITHOUT CARDIOVERSION N/A 06/23/2014   Procedure: TRANSESOPHAGEAL ECHOCARDIOGRAM (TEE);  Surgeon: Ezra RAMAN Rolan, MD;  Location: [facility] ENDOSCOPY;  Service: Cardiovascular;  Laterality: N/A;   TEE WITHOUT CARDIOVERSION N/A 01/21/2016   Procedure: TRANSESOPHAGEAL ECHOCARDIOGRAM (TEE);  Surgeon: Ezra RAMAN Rolan, MD;  Location: [facility] ENDOSCOPY;  Service: Cardiovascular;  Laterality: N/A;   TEE WITHOUT CARDIOVERSION N/A 09/16/2021   Procedure: TRANSESOPHAGEAL ECHOCARDIOGRAM (TEE);  Surgeon: Lucas Dorise POUR, MD;  Location: [facility] OR;  Service: Open Heart Surgery;  Laterality: N/A;   TEE WITHOUT CARDIOVERSION N/A 09/28/2021   Procedure: TRANSESOPHAGEAL ECHOCARDIOGRAM (TEE);  Surgeon: Lucas Dorise POUR, MD;  Location: [facility] OR;  Service: Thoracic;  Laterality: N/A;   TONSILLECTOMY     TRANSESOPHAGEAL ECHOCARDIOGRAM (CATH LAB) N/A 04/17/2023   Procedure: TRANSESOPHAGEAL ECHOCARDIOGRAM;  Surgeon: Jeffrie Oneil BROCKS, MD;  Location: [facility] INVASIVE CV LAB;  Service: Cardiovascular;  Laterality: N/A;   TRANSURETHRAL RESECTION OF BLADDER TUMOR N/A 10/21/2019   Procedure: TRANSURETHRAL RESECTION OF BLADDER TUMOR (TURBT);  Surgeon: Ottelin, Mark, MD;  Location: [facility];  Service: Urology;  Laterality: N/A;   TRANSURETHRAL RESECTION OF BLADDER TUMOR N/A 12/13/2019   Procedure: TRANSURETHRAL RESECTION OF BLADDER TUMOR (TURBT);  Surgeon: Alvaro Ricardo,  MD;  Location: [facility];  Service: Urology;  Laterality: N/A;  1 HR   TRANSURETHRAL RESECTION OF BLADDER TUMOR N/A 10/14/2020   Procedure: TRANSURETHRAL RESECTION OF BLADDER TUMOR (TURBT);  Surgeon: Alvaro Ricardo, MD;  Location: [facility];  Service: Urology;  Laterality: N/A;   TRANSURETHRAL RESECTION OF BLADDER TUMOR N/A 12/16/2020   Procedure: RESTAGING TRANSURETHRAL RESECTION OF BLADDER TUMOR (TURBT);  Surgeon: Alvaro Ricardo, MD;  Location: [facility];  Service: Urology;  Laterality: N/A;   TRANSURETHRAL RESECTION OF BLADDER TUMOR N/A 06/03/2022   Procedure: TRANSURETHRAL RESECTION OF BLADDER TUMOR (TURBT);  Surgeon: Alvaro Ricardo, MD;  Location: [facility];  Service: Urology;  Laterality: N/A;   UMBILICAL HERNIA REPAIR  03/03/2023   Procedure: HERNIA REPAIR UMBILICAL;  Surgeon: Alvaro Ricardo KATHEE Mickey., MD;  Location: [facility];  Service: Urology;;    SOCIAL HISTORY: Social History   Socioeconomic History   Marital status: Married    Spouse name: Not on file   Number of children: 4   Years of education:  Not on file   Highest education level: Not on file  Occupational History   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  Tobacco Use   Smoking status: Former    Current packs/day: 0.00    Average packs/day: 2.0 packs/day for 40.0 years (80.0 ttl pk-yrs)    Types: Cigarettes    Start date: 35    Quit date: 2012    Years since quitting: 13.7    Passive exposure: Never   Smokeless tobacco: Never  Vaping Use   Vaping status: Never Used  Substance and Sexual Activity   Alcohol use: No    Alcohol/week: 0.0 standard drinks of alcohol   Drug use: No   Sexual activity: Not on file  Other Topics Concern   Not on file  Social History Narrative   Not on file   Social Drivers of Health   Financial Resource Strain: Low Risk  (11/03/2022)   Overall Financial Resource Strain (CARDIA)    Difficulty of Paying Living  Expenses: Not hard at all  Food Insecurity: No Food Insecurity (07/04/2023)   Hunger Vital Sign    Worried About Running Out of Food in the Last Year: Never true    Ran Out of Food in the Last Year: Never true  Transportation Needs: No Transportation Needs (07/04/2023)   PRAPARE - Administrator, Civil Service (Medical): No    Lack of Transportation (Non-Medical): No  Physical Activity: Unknown (11/03/2022)   Exercise Vital Sign    Days of Exercise per Week: Patient unable to answer    Minutes of Exercise per Session: 30 min  Stress: No Stress Concern Present (11/03/2022)   [employer] of Occupational Health - Occupational Stress Questionnaire    Feeling of Stress : Not at all  Social Connections: Moderately Isolated (06/27/2023)   Social Connection and Isolation Panel    Frequency of Communication with Friends and Family: More than three times a week    Frequency of Social Gatherings with Friends and Family: More than three times a week    Attends Religious Services: Never    Database administrator or Organizations: No    Attends Banker Meetings: Never    Marital Status: Married  Catering manager Violence: Not At Risk (07/04/2023)   Humiliation, Afraid, Rape, and Kick questionnaire    Fear of Current or Ex-Partner: No    Emotionally Abused: No    Physically Abused: No    Sexually Abused: No    FAMILY HISTORY: Family History  Problem Relation Age of Onset   Lung cancer Mother        lung   Esophageal cancer Cousin    Colon cancer Neg Hx    Rectal cancer Neg Hx    Stomach cancer Neg Hx     ALLERGIES:  has no known allergies.  MEDICATIONS:  Current Outpatient Medications  Medication Sig Dispense Refill   acetaminophen  (TYLENOL ) 325 MG tablet Take 650 mg by mouth every 6 (six) hours as needed (for pain).     amLODipine  (NORVASC ) 5 MG tablet TAKE 1 TABLET (5 MG TOTAL) BY MOUTH DAILY. 90 tablet 1   aspirin  EC 81 MG tablet Take 81 mg by mouth in the  morning.     carvedilol  (COREG ) 3.125 MG tablet TAKE 1 TABLET BY MOUTH TWICE A DAY WITH FOOD 180 tablet 1   cyanocobalamin  (VITAMIN B12) 1000 MCG tablet Take 1 tablet (1,000 mcg total) by mouth daily.  30 tablet 6   diazepam  (VALIUM ) 5 MG tablet TAKE 1 TABLET BY MOUTH EVERY 12 HOURS AS NEEDED FOR ANXIETY. 60 tablet 5   famotidine  (PEPCID ) 20 MG tablet Take 1 tablet (20 mg total) by mouth 2 (two) times daily for 6 days. (Patient taking differently: Take 20 mg by mouth 2 (two) times daily. As needed) 12 tablet 0   ferrous sulfate  325 (65 FE) MG tablet Take 1 tablet (325 mg total) by mouth daily with breakfast. Please take with a source of Vitamin C 90 tablet 3   furosemide  (LASIX ) 20 MG tablet Take 1 tablet (20 mg total) by mouth daily. 45 tablet 3   ipratropium (ATROVENT ) 0.03 % nasal spray Place 2 sprays into both nostrils every 12 (twelve) hours as needed for rhinitis.     lidocaine -prilocaine  (EMLA ) cream Apply topically once a week. 30 g 0   Magnesium  500 MG TABS Take 500 mg by mouth every morning.     meclizine  (ANTIVERT ) 25 MG tablet Take 1 tablet (25 mg total) by mouth 3 (three) times daily as needed for dizziness. 30 tablet 0   multivitamin-iron-minerals-folic acid  (CENTRUM) chewable tablet Chew 1 tablet by mouth daily.     nitroGLYCERIN  (NITROSTAT ) 0.4 MG SL tablet Place 0.4 mg under the tongue every 5 (five) minutes as needed for chest pain.     ondansetron  (ZOFRAN ) 8 MG tablet Take 1 tablet (8 mg total) by mouth every 8 (eight) hours as needed. 30 tablet 0   pantoprazole  (PROTONIX ) 40 MG tablet Take 1 tablet (40 mg total) by mouth 2 (two) times daily before a meal. 60 tablet 5   potassium chloride  SA (KLOR-CON  M20) 20 MEQ tablet Take 1 tablet (20 mEq total) by mouth daily. (Patient taking differently: Take 20 mEq by mouth 2 (two) times daily.)     prochlorperazine  (COMPAZINE ) 10 MG tablet Take 1 tablet (10 mg total) by mouth every 6 (six) hours as needed for nausea or vomiting. 30 tablet 0    rosuvastatin  (CRESTOR ) 20 MG tablet Take 1 tablet (20 mg total) by mouth daily. 90 tablet 3   temazepam  (RESTORIL ) 30 MG capsule Take 1 capsule (30 mg total) by mouth at bedtime as needed for sleep. 90 capsule 1   traMADol  (ULTRAM ) 50 MG tablet Take 1 tablet (50 mg total) by mouth every 6 (six) hours as needed. 30 tablet 0   venlafaxine  XR (EFFEXOR -XR) 150 MG 24 hr capsule Take 1 capsule (150 mg total) by mouth daily with breakfast. 90 capsule 3   No current facility-administered medications for this visit.    REVIEW OF SYSTEMS:   Constitutional: ( - ) fevers, ( - )  chills , ( - ) night sweats Eyes: ( - ) blurriness of vision, ( - ) double vision, ( - ) watery eyes Ears, nose, mouth, throat, and face: ( - ) mucositis, ( - ) sore throat Respiratory: ( - ) cough, ( - ) dyspnea, ( - ) wheezes Cardiovascular: ( - ) palpitation, ( - ) chest discomfort, ( - ) lower extremity swelling Gastrointestinal:  ( - ) nausea, ( - ) heartburn, ( - ) change in bowel habits Skin: ( - ) abnormal skin rashes Lymphatics: ( - ) new lymphadenopathy, ( - ) easy bruising Neurological: ( - ) numbness, ( - ) tingling, ( - ) new weaknesses Behavioral/Psych: ( - ) mood change, ( - ) new changes  All other systems were reviewed with the patient and are negative.  PHYSICAL EXAMINATION: ECOG PERFORMANCE STATUS: 1 - Symptomatic but completely ambulatory  Vitals:   01/11/24 1427  BP: (!) 143/79  Pulse: 61  Resp: 13  Temp: 97.6 F (36.4 C)  SpO2: 100%     Filed Weights   01/11/24 1427  Weight: 244 lb (110.7 kg)      GENERAL: well appearing elderly Caucasian male in NAD  SKIN: skin color, texture, turgor are normal, no rashes or significant lesions EYES: conjunctiva are pink and non-injected, sclera clear LUNGS: clear to auscultation and percussion with normal breathing effort HEART: regular rate & rhythm and no murmurs and no lower extremity edema Musculoskeletal: no cyanosis of digits and no  clubbing.  PSYCH: alert & oriented x 3, fluent speech NEURO: no focal motor/sensory deficits  LABORATORY DATA:  I have reviewed the data as listed    Latest Ref Rng & Units 01/11/2024    2:06 PM 01/04/2024   12:58 PM 12/28/2023    9:43 AM  CBC  WBC 4.0 - 10.5 K/uL 6.2  5.3  4.9   Hemoglobin 13.0 - 17.0 g/dL 8.9  9.7  9.3   Hematocrit 39.0 - 52.0 % 26.6  28.8  28.1   Platelets 150 - 400 K/uL 166  187  167        Latest Ref Rng & Units 01/11/2024    2:06 PM 01/04/2024   12:58 PM 12/28/2023    9:43 AM  CMP  Glucose 70 - 99 mg/dL 858  809  831   BUN 8 - 23 mg/dL 17  20  20    Creatinine 0.61 - 1.24 mg/dL 8.26  8.03  8.17   Sodium 135 - 145 mmol/L 136  135  134   Potassium 3.5 - 5.1 mmol/L 4.0  3.8  3.8   Chloride 98 - 111 mmol/L 104  101  99   CO2 22 - 32 mmol/L 26  26  29    Calcium  8.9 - 10.3 mg/dL 8.5  8.9  8.4   Total Protein 6.5 - 8.1 g/dL 7.4  7.9  7.3   Total Bilirubin 0.0 - 1.2 mg/dL 0.8  0.9  0.9   Alkaline Phos 38 - 126 U/L 114  118  109   AST 15 - 41 U/L 32  32  30   ALT 0 - 44 U/L 23  24  28      RADIOGRAPHIC STUDIES: IR NEPHROSTOMY EXCHANGE BILATERAL Result Date: 01/10/2024 INDICATION: Routine bilateral percutaneous nephrostomy tube exchanges EXAM: Nephrostomy tube change COMPARISON:  None Available. MEDICATIONS: None; The antibiotic was administered in an appropriate time frame prior to skin puncture. ANESTHESIA/SEDATION: None CONTRAST:  15 mL Omnipaque  300-administered into the collecting system(s) FLUOROSCOPY: Radiation Exposure Index (as provided by the fluoroscopic device): 53 mGy Kerma COMPLICATIONS: None immediate. PROCEDURE: Informed written consent was obtained from the patient after a thorough discussion of the procedural risks, benefits and alternatives. All questions were addressed. Maximal Sterile Barrier Technique was utilized including caps, mask, sterile gowns, sterile gloves, sterile drape, hand hygiene and skin antiseptic. A timeout was performed prior to the  initiation of the procedure. In a prone position, both flank regions were prepped and draped in usual sterile fashion. Dilute contrast was then injected under fluoroscopic evaluation to identify catheter position and patency. The retention sutures and catheters were then cut. In sequential fashion the right and then the left catheters were removed over guidewires. New 10 French skater nephrostomy tubes were prepped at the table and advanced over the guidewire  into the right collecting system. The stiffener and guidewire were then removed leaving the catheter coiled in good position within the renal pelvis. Retention suture applied. The catheter was connected to gravity drainage. The short Amplatz wire was then advanced into the left percutaneous nephrostomy tube until it was visualized within the renal pelvis. The catheter was removed and a new 10 [place] skater nephrostomy tube was advanced over the guidewire and coiled within the renal pelvis. Retention suture applied. Guidewire and stiffener were removed. Sterile dressing applied. IMPRESSION: Satisfactory exchange of bilateral 10 [place] skater percutaneous nephrostomy tubes. Patient will return in approximately 8 weeks for routine exchange. Electronically Signed   By: Cordella Banner   On: 01/10/2024 17:12     ASSESSMENT & PLAN Luis [name] 68 y.o. 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  After review of the labs, review of the records, and discussion with the patient the patients findings are most consistent with BCG unresponsive high risk non-muscle invasive bladder cancer, not a candidate for cystectomy.  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 21 days until progression or intolerance up to 24 months. --Received 8 cycles of  Pembrolizumab  on 08/12/2022-01/13/2023: Cycle 8 of Pembrolizumab   --On 03/03/2023, underwent Cystectomy/Prostatectomy showed infiltrative high-grade urothelial carcinoma, size 5.8 cm involving bladder dome, anterior wall and prostatic stroma. Tumor invades directly into prostatic stroma at apex and mid portion of the gland (pT4a). Metastatic urothelial cancer in one of two left common iliac lymph nodes.  -- Given the results of the pathology showing positive margins and spread to the lymph node we are considering radiation therapy versus chemotherapy.  For chemotherapy, recommend gemcitabine  and cisplatin x 4 cycles.  This would consist of gemcitabine  1000 mg/m on days 1 and 8 and cisplatin 70 mg/m on day 1 of 21-day cycle for 4 cycles. -- Due to kidney dysfunction and anemia, chemotherapy was deferred and patient underwent adjuvant radiation to the surgical bed from 06/19/2023 until 07/27/2023.  He received 50.4 Gray in 28 fractions. PLAN:  --Today is Cycle 3, Day 15 of Enfortumab therapy --Labs from today show white blood cell 6.2, Hgb 8.9, MCV 90.8, Plt 166, creatinine stable at 1.72, LFTs within normal limits --Due for CT CAP on 01/22/2024 --Proceed with treatment today without any dose modifications.  --RTC weekly for treatment and every 2 weeks for clinic visit.  # Skin Rash -- Occurred after receiving blood transfusion, etiology not entirely clear. -- Patient has been applying moisturizer to the skin and has been taking Benadryl . -- Skin is persistently dry and itchy and erythematous. -- Completed steroid taper with 60 mg for 5 days, 40 for 5 days, and 20 for 5 days.  #Anemia --Hemoglobin 8.9 --May be multifactorial due to nutritional deficiency as well as kidney dysfunction. -- Nutritional labs show borderline vitamin B12 deficiency and iron deficiency. -- Patient is currently taking vitamin B12 p.o. daily and ferrous sulfate  every other day. -- Receives vitamin B12 1000 mcg injection  monthly, last received on 01/04/2024. -- Last received IV monoferric  1000 mg x 1 dose on 08/25/2023.  -- Will recheck vitamin B12 and iron levels are next visit.   #Hypokalemia: --Potassium level is 4.0 today --Patient is currently taking potassium supplement 20 meq BID. Advised to continue  #Flank pain--improved: --Secondary to bilateral nephrostomy. Patient is currently taking tylenol   with minimal improvement.  --Has prescription for tramadol  50 mg PO q 6 hours as needed.   # AKI -- Currently has bilateral nephrostomies, underwent exchange on yesterday, 01/10/2024. --monitoring Cr at each visit.   #Supportive Care -- chemotherapy education complete -- port placed -- zofran  8mg  q8H PRN and compazine  10mg  PO q6H for nausea -- EMLA  cream for port -- no pain medication required at this time.    No orders of the defined types were placed in this encounter.  All questions were answered. The patient knows to call the clinic with any problems, questions or concerns.  I have spent a total of 30 minutes minutes of face-to-face and non-face-to-face time, preparing to see the patient, performing a medically appropriate examination, counseling and educating the patient, documenting clinical information in the electronic health record,and care coordination.   Johnston Police PA-C Dept of Hematology and Oncology [facility] Phone: [phone]

## 2024-01-12 ENCOUNTER — Other Ambulatory Visit (HOSPITAL_COMMUNITY): Payer: Self-pay | Admitting: Cardiology

## 2024-01-12 DIAGNOSIS — I5032 Chronic diastolic (congestive) heart failure: Secondary | ICD-10-CM

## 2024-01-13 ENCOUNTER — Other Ambulatory Visit: Payer: Self-pay | Admitting: Hematology and Oncology

## 2024-01-13 DIAGNOSIS — C679 Malignant neoplasm of bladder, unspecified: Secondary | ICD-10-CM

## 2024-01-14 ENCOUNTER — Encounter: Payer: Self-pay | Admitting: Hematology and Oncology

## 2024-01-14 NOTE — Progress Notes (Signed)
 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 Completed adjuvant radiation 2/24-4/3. He is currently receiving Enfortumab (start 7/10).   Past medical history includes internal hemorrhoids, CAD, chronic dCHF, moderate aortic stenosis, PAF, AAA s/p replacement, pericardial effusion, OSA, gastric ulcer with hemorrhage, acute on chronic anemia, HLD, GAD  Met with patient and wife in clinic. Patient awaiting lab results to proceed with treatment. He reports decreased appetite and intake. Patient notes foods taste off and the smell of cooking food is nauseating. Eating 3 meals, however portions are smaller than usual. Had sausage biscuit with orange juice at breakfast. Chicken noodle soup for lunch, sandwich for dinner (chicken salad or peanut butter jelly). Recalls splitting half rack of ribs with wife a few nights ago. This tasted good, however this is all he had. Usually would eat half rack plus sides. Patient reports healing gastric ulcer. He is avoiding spicy foods. Patient drinks mostly water . He will occasionally have a diet Mt. Dew.   Patient reports antiemetics work well for intermittent abdominal discomfort, although he is unsure if this is nausea. Bowels are moving regularly. Patient compliant with daily Lasix . Recent dose reduction to 20mg /day per pt.   Nutrition Focused Physical Exam: deferred   Medications: norvasc , coreg , valium , pepcid , ferrous sulfate , lasix  29mg , Mg, MVI, zofran , protoni, klor-con , compazine , crestor , restoril , tramadol , effexor    Labs: Hgb 8.9 - CMP processing    Anthropometrics:   Height: 5'6 Weight: 244 lb UBW: 261 lb (8/6) BMI: 39.38  NUTRITION DIAGNOSIS: Unintended wt loss related to cancer and associated treatment side effects as evidenced by altered taste, decreased appetite/intake, 6.5% wt loss in 6 weeks which is  clinically severe for time frame   INTERVENTION:  Discussed strategies for taste/smell changes - encourage choosing cool/room temperature foods, opening windows, eating outdoors - handout with tips provided Encourage small frequent meals 4-6 times vs 3 larger meals Suggested use of ONS to supplement intake - samples of Ensure Complete + coupons provided Contact information provided    MONITORING, EVALUATION, GOAL: Patient will tolerate adequate calories and protein to minimize further wt loss    Next Visit: Thursday October 30 during infusion

## 2024-01-22 ENCOUNTER — Ambulatory Visit (HOSPITAL_COMMUNITY)
Admission: RE | Admit: 2024-01-22 | Discharge: 2024-01-22 | Disposition: A | Discharge status: Home / Self Care | Source: Ambulatory Visit | Attending: Hematology and Oncology | Admitting: Hematology and Oncology

## 2024-01-22 DIAGNOSIS — K760 Fatty (change of) liver, not elsewhere classified: Secondary | ICD-10-CM | POA: Diagnosis not present

## 2024-01-22 DIAGNOSIS — C679 Malignant neoplasm of bladder, unspecified: Secondary | ICD-10-CM | POA: Diagnosis not present

## 2024-01-22 DIAGNOSIS — N1330 Unspecified hydronephrosis: Secondary | ICD-10-CM | POA: Diagnosis not present

## 2024-01-22 DIAGNOSIS — I700 Atherosclerosis of aorta: Secondary | ICD-10-CM | POA: Diagnosis not present

## 2024-01-22 MED ORDER — IOHEXOL 300 MG/ML  SOLN
75.0000 mL | Freq: Once | INTRAMUSCULAR | Status: AC | PRN
  Administered 2024-01-22: 75 mL via INTRAVENOUS

## 2024-01-24 ENCOUNTER — Telehealth: Payer: Self-pay | Admitting: *Deleted

## 2024-01-24 NOTE — Progress Notes (Signed)
 Patient Care Team: Johnny Garnette LABOR, MD as PCP - General Shlomo Wilbert SAUNDERS, MD as PCP - Sleep Medicine (Cardiology) Rolan Ezra RAMAN, MD as PCP - Advanced Heart Failure (Cardiology) Kelsie Agent, MD (Inactive) as Consulting Physician (Cardiology) Liane Sharyne MATSU, [facility] (Inactive) as Pharmacist (Pharmacist)  Clinic Day:  01/25/2024  Referring physician: Johnny Garnette LABOR, MD  ASSESSMENT & PLAN:   Assessment & Plan: Bladder cancer (HCC) BCG-Unresponsive High Risk Non-Muscle Invasive Bladder Cancer --patient is not felt to be a candidate for cystectomy -- Recommended pembrolizumab  200 mg q. 21 days until progression or intolerance up to 24 months. --Received 8 cycles of Pembrolizumab  on 08/12/2022-01/13/2023: Cycle 8 of Pembrolizumab   --On 03/03/2023, underwent Cystectomy/Prostatectomy showed infiltrative high-grade urothelial carcinoma, size 5.8 cm involving bladder dome, anterior wall and prostatic stroma. Tumor invades directly into prostatic stroma at apex and mid portion of the gland (pT4a). Metastatic urothelial cancer in one of two left common iliac lymph nodes.  -- Given the results of the pathology showing positive margins and spread to the lymph node we are considering radiation therapy versus chemotherapy.  For chemotherapy, recommend gemcitabine  and cisplatin x 4 cycles.  This would consist of gemcitabine  1000 mg/m on days 1 and 8 and cisplatin 70 mg/m on day 1 of 21-day cycle for 4 cycles. -- Due to kidney dysfunction and anemia, chemotherapy was deferred and patient underwent adjuvant radiation to the surgical bed from 06/19/2023 until 07/27/2023.  He received 50.4 Gray in 28 fractions. PLAN:  --Today is Cycle 3, Day 15 of Enfortumab therapy --Labs from today show white blood cell 6.2, Hgb 8.9, MCV 90.8, Plt 166, creatinine stable at 1.72, LFTs within normal limits --Due for CT CAP on 01/22/2024 --Proceed with treatment today without any dose modifications.  --RTC weekly for treatment and  every 2 weeks for clinic visit   CKD BUN 23, serum creatinine 2.05, eGFR 34.  Gradual worsening.  Patient with bilateral nephrostomy tubes.  Is followed by nephrology and urology.  Discussed lab results with [place], [place], and okay to proceed with Enfortumab without dose reduction today.  Anemia Hgb 8.8 and HCT 27.6.  High ferritin with normal B12.  Normal iron studies.  LFTs normal.  Currently taking oral iron and oral B12 supplements.  Consider taking oral iron every other day due to rising serum ferritin.  Will continue to monitor closely.  Decreased appetite Patient having a difficult time eating.  Food not appealing and/or not tasting well.  He has thus had a weight loss of 9 pounds since 01/11/2024.  He will see nutritionist later today.  Discussed medication options to help improve appetite.  Will start mirtazapine 7.5 mg every evening.  Advised him this medication may cause him to feel sleepy and should take in the evening rather than morning time.  He understands this is an older TCA antidepressant and a common side effect is increased appetite and weight gain.  He is agreeable to try this.  Plan Labs reviewed. - Worsening CKD with eGFR 34. - Stable anemia.  Will recommend oral iron supplement every other day.  Continue to monitor closely. Patient labs and presentation are satisfactory to proceed with Enfortumab, cycle 4 day 1. Continue with labs and treatment weekly with follow-up every 2 weeks.  The patient understands the plans discussed today and is in agreement with them.  He knows to contact our office if he develops concerns prior to his next appointment.  I provided 30 minutes of face-to-face time during this encounter  and > 50% was spent counseling as documented under my assessment and plan.    Powell FORBES Lessen, NP  [facility] - A DEPT OF [facility] [address] [place] [address] Dept: [phone] Dept  Fax: [phone]   No orders of the defined types were placed in this encounter.     CHIEF COMPLAINT:  CC: Malignant neoplasm of urinary bladder  Current Treatment: Enfortumab weekly   INTERVAL HISTORY:  Alfard is here today for repeat clinical assessment. He denies fevers or chills.  He was last seen on 01/11/2024.  Today, he presents for cycle 4 day 1 chemotherapy Enfortumab.  He continues to have discomfort in his mid back.  Does have bilateral nephrostomy tubes.  The tubes appear patent and are draining blood tinged urine.  His appetite continues to be poor.  Food is either not appealing or does not taste right.  Has difficulty sleeping at night but naps off-and-on most of the day.  He denies chest pain, chest pressure, or shortness of breath. He denies headaches or visual disturbances. He denies abdominal pain or changes in bowel or bladder habits. has decreased 9 pounds over last 2 weeks.  I have reviewed the past medical history, past surgical history, social history and family history with the patient and they are unchanged from previous note.  ALLERGIES:  has no known allergies.  MEDICATIONS:  Current Outpatient Medications  Medication Sig Dispense Refill   mirtazapine (REMERON) 7.5 MG tablet Take 1 tablet (7.5 mg total) by mouth at bedtime. 30 tablet 1   acetaminophen  (TYLENOL ) 325 MG tablet Take 650 mg by mouth every 6 (six) hours as needed (for pain).     amLODipine  (NORVASC ) 5 MG tablet TAKE 1 TABLET (5 MG TOTAL) BY MOUTH DAILY. 90 tablet 1   aspirin  EC 81 MG tablet Take 81 mg by mouth in the morning.     carvedilol  (COREG ) 3.125 MG tablet TAKE 1 TABLET BY MOUTH TWICE A DAY WITH FOOD 180 tablet 1   cyanocobalamin  (VITAMIN B12) 1000 MCG tablet Take 1 tablet (1,000 mcg total) by mouth daily. 30 tablet 6   diazepam  (VALIUM ) 5 MG tablet TAKE 1 TABLET BY MOUTH EVERY 12 HOURS AS NEEDED FOR ANXIETY. 60 tablet 5   famotidine  (PEPCID ) 20 MG tablet Take 1 tablet (20 mg total) by mouth 2  (two) times daily for 6 days. (Patient taking differently: Take 20 mg by mouth 2 (two) times daily. As needed) 12 tablet 0   ferrous sulfate  325 (65 FE) MG tablet Take 1 tablet (325 mg total) by mouth daily with breakfast. Please take with a source of Vitamin C 90 tablet 3   furosemide  (LASIX ) 20 MG tablet TAKE 3 TABLETS BY MOUTH EVERY MORNING AND TAKE 2 TABLETS BY MOUTH EVERY EVENING 150 tablet 3   ipratropium (ATROVENT ) 0.03 % nasal spray Place 2 sprays into both nostrils every 12 (twelve) hours as needed for rhinitis.     lidocaine -prilocaine  (EMLA ) cream Apply topically once a week. 30 g 0   Magnesium  500 MG TABS Take 500 mg by mouth every morning.     meclizine  (ANTIVERT ) 25 MG tablet Take 1 tablet (25 mg total) by mouth 3 (three) times daily as needed for dizziness. 30 tablet 0   multivitamin-iron-minerals-folic acid  (CENTRUM) chewable tablet Chew 1 tablet by mouth daily.     nitroGLYCERIN  (NITROSTAT ) 0.4 MG SL tablet Place 0.4 mg under the  tongue every 5 (five) minutes as needed for chest pain.     ondansetron  (ZOFRAN ) 8 MG tablet Take 1 tablet (8 mg total) by mouth every 8 (eight) hours as needed. 30 tablet 0   pantoprazole  (PROTONIX ) 40 MG tablet Take 1 tablet (40 mg total) by mouth 2 (two) times daily before a meal. 60 tablet 5   potassium chloride  SA (KLOR-CON  M20) 20 MEQ tablet Take 1 tablet (20 mEq total) by mouth daily. (Patient taking differently: Take 20 mEq by mouth 2 (two) times daily.)     prochlorperazine  (COMPAZINE ) 10 MG tablet Take 1 tablet (10 mg total) by mouth every 6 (six) hours as needed for nausea or vomiting. 30 tablet 0   rosuvastatin  (CRESTOR ) 20 MG tablet Take 1 tablet (20 mg total) by mouth daily. 90 tablet 3   temazepam  (RESTORIL ) 30 MG capsule Take 1 capsule (30 mg total) by mouth at bedtime as needed for sleep. 90 capsule 1   traMADol  (ULTRAM ) 50 MG tablet Take 1 tablet (50 mg total) by mouth every 6 (six) hours as needed. 30 tablet 0   venlafaxine  XR (EFFEXOR -XR)  150 MG 24 hr capsule Take 1 capsule (150 mg total) by mouth daily with breakfast. 90 capsule 3   No current facility-administered medications for this visit.    HISTORY OF PRESENT ILLNESS:   Oncology History  Bladder cancer (HCC)  07/29/2022 Initial Diagnosis   Bladder cancer   07/29/2022 Cancer Staging   Staging form: Urinary Bladder, AJCC 8th Edition - Clinical stage from 07/29/2022: Stage I (cT1, cN0, cM0) - Signed by Federico Norleen ONEIDA MADISON, MD on 07/29/2022 Stage prefix: Initial diagnosis   08/12/2022 - 01/13/2023 Chemotherapy   Patient is on Treatment Plan : BLADDER Pembrolizumab  (200) q21d     03/03/2023 Cancer Staging   Staging form: Urinary Bladder, AJCC 8th Edition - Pathologic stage from 03/03/2023: Stage IIIA (pT4a, pN1, cM0) - Signed by Sherwood Rise, PA-C on 05/11/2023 Histopathologic type: Transitional cell carcinoma, NOS WHO/ISUP grade (low/high): High Grade Histologic grading system: 2 grade system Residual tumor (R): R1 - Microscopic   06/06/2023 - 06/06/2023 Chemotherapy   Patient is on Treatment Plan : BLADDER Cisplatin D1 + Gemcitabine  D1,8 q21d x 4 Cycles     11/02/2023 -  Chemotherapy   Patient is on Treatment Plan : UROTHELIAL LOCALLY ADVANCED, METASTATIC Enfortumab D1,8,15 q28d         REVIEW OF SYSTEMS:   Constitutional: Denies fevers or chills. He has decreased appetite and has lost 9 pounds over the past 2 weeks  Eyes: Denies blurriness of vision Ears, nose, mouth, throat, and face: Denies mucositis or sore throat Respiratory: Denies cough, dyspnea or wheezes Cardiovascular: Denies palpitation, chest discomfort or lower extremity swelling Gastrointestinal:  he reports nausea without vomiting. Denies changes in bowel habits. Has bilateral nephrostomy tubes.  Skin: Denies abnormal skin rashes Lymphatics: Denies new lymphadenopathy or easy bruising Neurological:Denies numbness, tingling or new weaknesses Behavioral/Psych: Mood is stable, no new changes. Increased  anxiety.  All other systems were reviewed with the patient and are negative.   VITALS:   Today's Vitals   01/25/24 1039  BP: 134/76  Pulse: (!) 59  Resp: 17  Temp: 97.8 F (36.6 C)  TempSrc: Temporal  SpO2: 100%  Weight: 235 lb (106.6 kg)  Height: 5' 6 (1.676 m)   Body mass index is 37.93 kg/m.   Wt Readings from Last 3 Encounters:  02/01/24 232 lb 12 oz (105.6 kg)  01/25/24 235 lb (  106.6 kg)  01/11/24 244 lb (110.7 kg)    Body mass index is 37.93 kg/m.  Performance status (ECOG): 2 - Symptomatic, <50% confined to bed  PHYSICAL EXAM:   GENERAL:alert, no distress. He appears fatigued and uncomfortable.  SKIN: skin color, texture, turgor are normal, no rashes or significant lesions EYES: normal, Conjunctiva are pink and non-injected, sclera clear OROPHARYNX:no exudate, no erythema and lips, buccal mucosa, and tongue normal  NECK: supple, thyroid  normal size, non-tender, without nodularity LYMPH:  no palpable lymphadenopathy in the cervical, axillary or inguinal LUNGS: clear to auscultation and percussion with normal breathing effort HEART: regular rate & rhythm and no murmurs and no lower extremity edema ABDOMEN:abdomen soft with normal bowel sounds. Generalized abdominal discomfort with palpation.  Musculoskeletal:no cyanosis of digits and no clubbing  NEURO: alert & oriented x 3 with fluent speech, no focal motor/sensory deficits  LABORATORY DATA:  I have reviewed the data as listed    Component Value Date/Time   NA 136 02/01/2024 1359   K 4.0 02/01/2024 1359   CL 104 02/01/2024 1359   CO2 27 02/01/2024 1359   GLUCOSE 136 (H) 02/01/2024 1359   BUN 20 02/01/2024 1359   CREATININE 1.76 (H) 02/01/2024 1359   CREATININE 1.08 01/14/2016 1431   CALCIUM  9.0 02/01/2024 1359   CALCIUM  8.3 (L) 01/25/2024 1013   PROT 7.8 02/01/2024 1359   ALBUMIN  3.6 02/01/2024 1359   AST 18 02/01/2024 1359   ALT 11 02/01/2024 1359   ALKPHOS 95 02/01/2024 1359   BILITOT 0.6  02/01/2024 1359   GFRNONAA 41 (L) 02/01/2024 1359   GFRAA >60 08/10/2019 1224   Lab Results  Component Value Date   WBC 5.3 02/01/2024   NEUTROABS 4.1 02/01/2024   HGB 8.7 (L) 02/01/2024   HCT 26.6 (L) 02/01/2024   MCV 90.8 02/01/2024   PLT 184 02/01/2024     RADIOGRAPHIC STUDIES: CT CHEST ABDOMEN PELVIS W CONTRAST Result Date: 01/22/2024 CLINICAL DATA:  Invasive bladder cancer, assess treatment response. * Tracking Code: BO * EXAM: CT CHEST, ABDOMEN, AND PELVIS WITH CONTRAST TECHNIQUE: Multidetector CT imaging of the chest, abdomen and pelvis was performed following the standard protocol during bolus administration of intravenous contrast. RADIATION DOSE REDUCTION: This exam was performed according to the departmental dose-optimization program which includes automated exposure control, adjustment of the mA and/or kV according to patient size and/or use of iterative reconstruction technique. CONTRAST:  75mL OMNIPAQUE  IOHEXOL  300 MG/ML  SOLN COMPARISON:  Multiple priors including PET-CT September 25, 2023 FINDINGS: CT CHEST FINDINGS Cardiovascular: Right chest Port-A-Cath with tip near the superior cavoatrial junction. Aortic atherosclerosis. Calcifications of the aortic valve. Prior median sternotomy and CABG. Normal size heart. No significant pericardial effusion/thickening. Mediastinum/Nodes: 12 mm nodule in the left lobe of the thyroid  on image 7/2 requiring no independent imaging follow-up. No pathologically enlarged mediastinal, hilar or axillary lymph nodes. Esophagus is grossly unremarkable. Lungs/Pleura: 5 mm pulmonary nodule seen on prior PET-CT is not seen on today's examination. No suspicious pulmonary nodules or masses. Scattered atelectasis/scarring. Musculoskeletal: No aggressive lytic or blastic lesion of bone. Multilevel degenerative change of the spine. CT ABDOMEN PELVIS FINDINGS Hepatobiliary: Diffuse hepatic steatosis. No suspicious hepatic lesion. Gallbladder surgically absent. No  biliary ductal dilation. Pancreas: No pancreatic ductal dilation or evidence of acute inflammation. Spleen: No splenomegaly. Adrenals/Urinary Tract: No suspicious adrenal nodule/mass. Left renal cyst. Bilateral percutaneous nephrostomy tubes, right-sided tube is retracted into and interpolar renal calyx with new mild hydronephrosis. Pigtail of the left tube is  sitting in the central collecting system. New mild prominence of the left collecting system. Bilateral peripelvic stranding. Prominence of the right ureter to the level of the ileal pouch. Urinary bladder is surgically absent with right anterior abdominal wall urostomy formation. Small fat containing peristomal hernia. Stomach/Bowel: Stomach is unremarkable for degree of distension. No pathologic dilation of small or large bowel. Postsurgical changes of prior ileal pouch urinary diversion. Vascular/Lymphatic: Aortic atherosclerosis. Decreased size of the left periaortic lymph nodes indexed lymph node measures 9 mm on image 74/2 previously 2 cm the other measures 6 mm in short axis on image 70/2 previously 14 mm. Reproductive: Prostate gland is surgically absent. Other: Mild peritoneal thickening. Mild stranding in the mesentery and pelvis. Musculoskeletal: No aggressive lytic or blastic lesion of bone. Thoracolumbar spondylosis. IMPRESSION: 1. Decreased size of the left periaortic lymph nodes, consistent with treatment response. 2. Bilateral percutaneous nephrostomy tubes, right-sided tube is retracted into an interpolar renal calyx with new mild hydroureteronephrosis. Pigtail of the left tube is sitting in the central collecting system. New mild prominence of the left collecting system. Suggest evaluation of stents for patency/functionality and urinalysis. 3. Postsurgical changes of prior cystoprostatectomy with right anterior abdominal wall urostomy formation. 4. Diffuse hepatic steatosis. These results will be called to the ordering clinician or  representative by the Radiologist Assistant, and communication documented in the PACS or [employer]. Electronically Signed   By: Reyes Holder M.D.   On: 01/22/2024 15:52   IR NEPHROSTOMY EXCHANGE BILATERAL Result Date: 01/10/2024 INDICATION: Routine bilateral percutaneous nephrostomy tube exchanges EXAM: Nephrostomy tube change COMPARISON:  None Available. MEDICATIONS: None; The antibiotic was administered in an appropriate time frame prior to skin puncture. ANESTHESIA/SEDATION: None CONTRAST:  15 mL Omnipaque  300-administered into the collecting system(s) FLUOROSCOPY: Radiation Exposure Index (as provided by the fluoroscopic device): 53 mGy Kerma COMPLICATIONS: None immediate. PROCEDURE: Informed written consent was obtained from the patient after a thorough discussion of the procedural risks, benefits and alternatives. All questions were addressed. Maximal Sterile Barrier Technique was utilized including caps, mask, sterile gowns, sterile gloves, sterile drape, hand hygiene and skin antiseptic. A timeout was performed prior to the initiation of the procedure. In a prone position, both flank regions were prepped and draped in usual sterile fashion. Dilute contrast was then injected under fluoroscopic evaluation to identify catheter position and patency. The retention sutures and catheters were then cut. In sequential fashion the right and then the left catheters were removed over guidewires. New 10 French skater nephrostomy tubes were prepped at the table and advanced over the guidewire into the right collecting system. The stiffener and guidewire were then removed leaving the catheter coiled in good position within the renal pelvis. Retention suture applied. The catheter was connected to gravity drainage. The short Amplatz wire was then advanced into the left percutaneous nephrostomy tube until it was visualized within the renal pelvis. The catheter was removed and a new 10 [place] skater nephrostomy  tube was advanced over the guidewire and coiled within the renal pelvis. Retention suture applied. Guidewire and stiffener were removed. Sterile dressing applied. IMPRESSION: Satisfactory exchange of bilateral 10 [place] skater percutaneous nephrostomy tubes. Patient will return in approximately 8 weeks for routine exchange. Electronically Signed   By: Cordella Banner   On: 01/10/2024 17:12

## 2024-01-24 NOTE — Telephone Encounter (Signed)
 Tct pt pt regarding his nephrostomy tubes and urine out put. There was some initial concerns above the positioning of these nephrostomy tubes. Per IR, the tubes are in similar position as when they did his last exchange of these tubes. Spoke with his wife and pt . He  states there has been some blood colored urine from the right sided tube this week but both are draining well. Denies fever, chills, pain. He has an appt here tomorrow so this can re-evaluated then. Wife and pt are comfortable with things the way they are right now.

## 2024-01-24 NOTE — Assessment & Plan Note (Addendum)
 BCG-Unresponsive High Risk Non-Muscle Invasive Bladder Cancer --patient is not felt to be a candidate for cystectomy -- Recommended pembrolizumab  200 mg q. 21 days until progression or intolerance up to 24 months. --Received 8 cycles of Pembrolizumab  on 08/12/2022-01/13/2023: Cycle 8 of Pembrolizumab   --On 03/03/2023, underwent Cystectomy/Prostatectomy showed infiltrative high-grade urothelial carcinoma, size 5.8 cm involving bladder dome, anterior wall and prostatic stroma. Tumor invades directly into prostatic stroma at apex and mid portion of the gland (pT4a). Metastatic urothelial cancer in one of two left common iliac lymph nodes.  -- Given the results of the pathology showing positive margins and spread to the lymph node we are considering radiation therapy versus chemotherapy.  For chemotherapy, recommend gemcitabine  and cisplatin x 4 cycles.  This would consist of gemcitabine  1000 mg/m on days 1 and 8 and cisplatin 70 mg/m on day 1 of 21-day cycle for 4 cycles. -- Due to kidney dysfunction and anemia, chemotherapy was deferred and patient underwent adjuvant radiation to the surgical bed from 06/19/2023 until 07/27/2023.  He received 50.4 Gray in 28 fractions. PLAN:  --Today is Cycle 4 day 1 of treatment, Enfortumab. --CT CAP from 01/22/2024 showed decreased size of periaortic nodes indicating good treatment response. --Labs with worsening renal functions and stable anemia.   --Will proceed with treatment today without dose reductions. --RTC weekly for treatment and every 2 weeks for clinic visit

## 2024-01-25 ENCOUNTER — Encounter: Payer: Self-pay | Admitting: Nurse Practitioner

## 2024-01-25 ENCOUNTER — Inpatient Hospital Stay

## 2024-01-25 ENCOUNTER — Inpatient Hospital Stay (HOSPITAL_BASED_OUTPATIENT_CLINIC_OR_DEPARTMENT_OTHER): Admitting: Nurse Practitioner

## 2024-01-25 ENCOUNTER — Other Ambulatory Visit: Payer: Self-pay | Admitting: *Deleted

## 2024-01-25 ENCOUNTER — Inpatient Hospital Stay: Attending: Hematology and Oncology

## 2024-01-25 VITALS — BP 134/76 | HR 59 | Temp 97.8°F | Resp 17 | Ht 66.0 in | Wt 235.0 lb

## 2024-01-25 VITALS — BP 119/60 | HR 56 | Temp 98.2°F | Resp 16

## 2024-01-25 DIAGNOSIS — C671 Malignant neoplasm of dome of bladder: Secondary | ICD-10-CM | POA: Diagnosis not present

## 2024-01-25 DIAGNOSIS — Z79899 Other long term (current) drug therapy: Secondary | ICD-10-CM | POA: Diagnosis not present

## 2024-01-25 DIAGNOSIS — C679 Malignant neoplasm of bladder, unspecified: Secondary | ICD-10-CM

## 2024-01-25 DIAGNOSIS — C673 Malignant neoplasm of anterior wall of bladder: Secondary | ICD-10-CM | POA: Diagnosis not present

## 2024-01-25 DIAGNOSIS — C772 Secondary and unspecified malignant neoplasm of intra-abdominal lymph nodes: Secondary | ICD-10-CM | POA: Insufficient documentation

## 2024-01-25 DIAGNOSIS — Z5112 Encounter for antineoplastic immunotherapy: Secondary | ICD-10-CM | POA: Insufficient documentation

## 2024-01-25 LAB — CMP (CANCER CENTER ONLY)
ALT: 14 U/L (ref 0–44)
AST: 18 U/L (ref 15–41)
Albumin: 3.6 g/dL (ref 3.5–5.0)
Alkaline Phosphatase: 105 U/L (ref 38–126)
Anion gap: 6 (ref 5–15)
BUN: 23 mg/dL (ref 8–23)
CO2: 27 mmol/L (ref 22–32)
Calcium: 8.8 mg/dL — ABNORMAL LOW (ref 8.9–10.3)
Chloride: 102 mmol/L (ref 98–111)
Creatinine: 2.05 mg/dL — ABNORMAL HIGH (ref 0.61–1.24)
GFR, Estimated: 34 mL/min — ABNORMAL LOW (ref >60)
Glucose, Bld: 163 mg/dL — ABNORMAL HIGH (ref 70–99)
Potassium: 4.4 mmol/L (ref 3.5–5.1)
Sodium: 135 mmol/L (ref 135–145)
Total Bilirubin: 0.8 mg/dL (ref 0.0–1.2)
Total Protein: 7.9 g/dL (ref 6.5–8.1)

## 2024-01-25 LAB — CBC WITH DIFFERENTIAL (CANCER CENTER ONLY)
Abs Immature Granulocytes: 0.02 K/uL (ref 0.00–0.07)
Basophils Absolute: 0 K/uL (ref 0.0–0.1)
Basophils Relative: 0 %
Eosinophils Absolute: 0.1 K/uL (ref 0.0–0.5)
Eosinophils Relative: 1 %
HCT: 27 % — ABNORMAL LOW (ref 39.0–52.0)
Hemoglobin: 8.8 g/dL — ABNORMAL LOW (ref 13.0–17.0)
Immature Granulocytes: 0 %
Lymphocytes Relative: 10 %
Lymphs Abs: 0.5 K/uL — ABNORMAL LOW (ref 0.7–4.0)
MCH: 29.9 pg (ref 26.0–34.0)
MCHC: 32.6 g/dL (ref 30.0–36.0)
MCV: 91.8 fL (ref 80.0–100.0)
Monocytes Absolute: 0.7 K/uL (ref 0.1–1.0)
Monocytes Relative: 12 %
Neutro Abs: 4.3 K/uL (ref 1.7–7.7)
Neutrophils Relative %: 77 %
Platelet Count: 199 K/uL (ref 150–400)
RBC: 2.94 MIL/uL — ABNORMAL LOW (ref 4.22–5.81)
RDW: 18.7 % — ABNORMAL HIGH (ref 11.5–15.5)
WBC Count: 5.6 K/uL (ref 4.0–10.5)
nRBC: 0 % (ref 0.0–0.2)

## 2024-01-25 LAB — IRON AND IRON BINDING CAPACITY (CC-WL,HP ONLY)
Iron: 53 ug/dL (ref 45–182)
Saturation Ratios: 22 % (ref 17.9–39.5)
TIBC: 242 ug/dL — ABNORMAL LOW (ref 250–450)
UIBC: 189 ug/dL (ref 117–376)

## 2024-01-25 LAB — FERRITIN: Ferritin: 1665 ng/mL — ABNORMAL HIGH (ref 24–336)

## 2024-01-25 LAB — VITAMIN B12: Vitamin B-12: 788 pg/mL (ref 180–914)

## 2024-01-25 MED ORDER — PROCHLORPERAZINE MALEATE 10 MG PO TABS
10.0000 mg | ORAL_TABLET | Freq: Once | ORAL | Status: AC
  Administered 2024-01-25: 10 mg via ORAL
  Filled 2024-01-25: qty 1

## 2024-01-25 MED ORDER — SODIUM CHLORIDE 0.9 % IV SOLN: INTRAVENOUS | Status: DC

## 2024-01-25 MED ORDER — SODIUM CHLORIDE 0.9 % IV SOLN
125.0000 mg | Freq: Once | INTRAVENOUS | Status: AC
  Administered 2024-01-25: 125 mg via INTRAVENOUS
  Filled 2024-01-25: qty 9

## 2024-01-25 MED ORDER — MIRTAZAPINE 7.5 MG PO TABS: 7.5000 mg | ORAL_TABLET | Freq: Every day | ORAL | 1 refills | Status: DC

## 2024-01-25 NOTE — Patient Instructions (Signed)
 [facility] - A DEPT OF [facility]  Discharge Instructions: Thank you for choosing [facility] to provide your oncology and hematology care.   If you have a lab appointment with the Cancer Center, please go directly to the Cancer Center and check in at the registration area.   Wear comfortable clothing and clothing appropriate for easy access to any Portacath or PICC line.   We strive to give you quality time with your provider. You may need to reschedule your appointment if you arrive late (15 or more minutes).  Arriving late affects you and other patients whose appointments are after yours.  Also, if you miss three or more appointments without notifying the office, you may be dismissed from the clinic at the provider's discretion.      For prescription refill requests, have your pharmacy contact our office and allow 72 hours for refills to be completed.    Today you received the following chemotherapy and/or immunotherapy agents: Enfortumab vedotin -ejfv (Padcev )    To help prevent nausea and vomiting after your treatment, we encourage you to take your nausea medication as directed.  BELOW ARE SYMPTOMS THAT SHOULD BE REPORTED IMMEDIATELY: *FEVER GREATER THAN 100.4 F (38 C) OR HIGHER *CHILLS OR SWEATING *NAUSEA AND VOMITING THAT IS NOT CONTROLLED WITH YOUR NAUSEA MEDICATION *UNUSUAL SHORTNESS OF BREATH *UNUSUAL BRUISING OR BLEEDING *URINARY PROBLEMS (pain or burning when urinating, or frequent urination) *BOWEL PROBLEMS (unusual diarrhea, constipation, pain near the anus) TENDERNESS IN MOUTH AND THROAT WITH OR WITHOUT PRESENCE OF ULCERS (sore throat, sores in mouth, or a toothache) UNUSUAL RASH, SWELLING OR PAIN  UNUSUAL VAGINAL DISCHARGE OR ITCHING   Items with * indicate a potential emergency and should be followed up as soon as possible or go to the Emergency Department if any problems should occur.  Please show the CHEMOTHERAPY ALERT  CARD or IMMUNOTHERAPY ALERT CARD at check-in to the Emergency Department and triage nurse.  Should you have questions after your visit or need to cancel or reschedule your appointment, please contact [facility] - A DEPT OF [facility]  Dept: [phone]  and follow the prompts.  Office hours are 8:00 a.m. to 4:30 p.m. Monday - Friday. Please note that voicemails left after 4:00 p.m. may not be returned until the following business day.  We are closed weekends and major holidays. You have access to a nurse at all times for urgent questions. Please call the main number to the clinic Dept: [phone] and follow the prompts.   For any non-urgent questions, you may also contact your provider using MyChart. We now offer e-Visits for anyone 39 and older to request care online for non-urgent symptoms. For details visit [email or web address].   Also download the MyChart app! Go to the app store, search MyChart, open the app, select [facility], and log in with your MyChart username and password.

## 2024-01-26 LAB — MICROALBUMIN / CREATININE URINE RATIO
Creatinine, Urine: 84.3 mg/dL
Creatinine, Urine: 83.2 mg/dL
Microalb Creat Ratio: 161 mg/g{creat} — ABNORMAL HIGH (ref 0–29)
Microalb Creat Ratio: 264 mg/g{creat} — ABNORMAL HIGH (ref 0–29)
Microalb, Ur: 135.7 ug/mL — ABNORMAL HIGH
Microalb, Ur: 220 ug/mL — ABNORMAL HIGH

## 2024-01-26 LAB — PTH, INTACT AND CALCIUM
Calcium, Total (PTH): 8.3 mg/dL — ABNORMAL LOW (ref 8.6–10.2)
PTH: 165 pg/mL — ABNORMAL HIGH (ref 15–65)

## 2024-02-01 ENCOUNTER — Inpatient Hospital Stay

## 2024-02-01 ENCOUNTER — Encounter: Payer: Self-pay | Admitting: Hematology and Oncology

## 2024-02-01 VITALS — BP 149/67 | HR 58 | Temp 98.1°F | Resp 16 | Wt 232.8 lb

## 2024-02-01 DIAGNOSIS — C679 Malignant neoplasm of bladder, unspecified: Secondary | ICD-10-CM

## 2024-02-01 DIAGNOSIS — Z5112 Encounter for antineoplastic immunotherapy: Secondary | ICD-10-CM | POA: Diagnosis not present

## 2024-02-01 DIAGNOSIS — Z95828 Presence of other vascular implants and grafts: Secondary | ICD-10-CM

## 2024-02-01 LAB — CBC WITH DIFFERENTIAL (CANCER CENTER ONLY)
Abs Immature Granulocytes: 0.03 K/uL (ref 0.00–0.07)
Basophils Absolute: 0 K/uL (ref 0.0–0.1)
Basophils Relative: 0 %
Eosinophils Absolute: 0.1 K/uL (ref 0.0–0.5)
Eosinophils Relative: 1 %
HCT: 26.6 % — ABNORMAL LOW (ref 39.0–52.0)
Hemoglobin: 8.7 g/dL — ABNORMAL LOW (ref 13.0–17.0)
Immature Granulocytes: 1 %
Lymphocytes Relative: 10 %
Lymphs Abs: 0.5 K/uL — ABNORMAL LOW (ref 0.7–4.0)
MCH: 29.7 pg (ref 26.0–34.0)
MCHC: 32.7 g/dL (ref 30.0–36.0)
MCV: 90.8 fL (ref 80.0–100.0)
Monocytes Absolute: 0.5 K/uL (ref 0.1–1.0)
Monocytes Relative: 10 %
Neutro Abs: 4.1 K/uL (ref 1.7–7.7)
Neutrophils Relative %: 78 %
Platelet Count: 184 K/uL (ref 150–400)
RBC: 2.93 MIL/uL — ABNORMAL LOW (ref 4.22–5.81)
RDW: 17.9 % — ABNORMAL HIGH (ref 11.5–15.5)
WBC Count: 5.3 K/uL (ref 4.0–10.5)
nRBC: 0 % (ref 0.0–0.2)

## 2024-02-01 LAB — CMP (CANCER CENTER ONLY)
ALT: 11 U/L (ref 0–44)
AST: 18 U/L (ref 15–41)
Albumin: 3.6 g/dL (ref 3.5–5.0)
Alkaline Phosphatase: 95 U/L (ref 38–126)
Anion gap: 5 (ref 5–15)
BUN: 20 mg/dL (ref 8–23)
CO2: 27 mmol/L (ref 22–32)
Calcium: 9 mg/dL (ref 8.9–10.3)
Chloride: 104 mmol/L (ref 98–111)
Creatinine: 1.76 mg/dL — ABNORMAL HIGH (ref 0.61–1.24)
GFR, Estimated: 41 mL/min — ABNORMAL LOW (ref >60)
Glucose, Bld: 136 mg/dL — ABNORMAL HIGH (ref 70–99)
Potassium: 4 mmol/L (ref 3.5–5.1)
Sodium: 136 mmol/L (ref 135–145)
Total Bilirubin: 0.6 mg/dL (ref 0.0–1.2)
Total Protein: 7.8 g/dL (ref 6.5–8.1)

## 2024-02-01 MED ORDER — CYANOCOBALAMIN 1000 MCG/ML IJ SOLN
1000.0000 ug | Freq: Once | INTRAMUSCULAR | Status: AC
  Administered 2024-02-01: 1000 ug via INTRAMUSCULAR
  Filled 2024-02-01: qty 1

## 2024-02-01 MED ORDER — PROCHLORPERAZINE MALEATE 10 MG PO TABS
10.0000 mg | ORAL_TABLET | Freq: Once | ORAL | Status: AC
  Administered 2024-02-01: 10 mg via ORAL
  Filled 2024-02-01: qty 1

## 2024-02-01 MED ORDER — CYANOCOBALAMIN 1000 MCG/ML IJ SOLN: 1000.0000 ug | Freq: Once | INTRAMUSCULAR | Status: DC

## 2024-02-01 MED ORDER — SODIUM CHLORIDE 0.9 % IV SOLN: INTRAVENOUS | Status: DC

## 2024-02-01 MED ORDER — SODIUM CHLORIDE 0.9 % IV SOLN
125.0000 mg | Freq: Once | INTRAVENOUS | Status: AC
  Administered 2024-02-01: 125 mg via INTRAVENOUS
  Filled 2024-02-01: qty 8.5

## 2024-02-01 NOTE — Patient Instructions (Signed)
 [facility] - A DEPT OF [facility]  [facility]  Discharge Instructions: Thank you for choosing [facility] to provide your oncology and hematology care.   If you have a lab appointment with the Cancer Center, please go directly to the Cancer Center and check in at the registration area.   Wear comfortable clothing and clothing appropriate for easy access to any Portacath or PICC line.   We strive to give you quality time with your provider. You may need to reschedule your appointment if you arrive late (15 or more minutes).  Arriving late affects you and other patients whose appointments are after yours.  Also, if you miss three or more appointments without notifying the office, you may be dismissed from the clinic at the provider's discretion.      For prescription refill requests, have your pharmacy contact our office and allow 72 hours for refills to be completed.    Today you received the following chemotherapy and/or immunotherapy agents:  enfortumab vedotin -ejfv (PADCEV )    To help prevent nausea and vomiting after your treatment, we encourage you to take your nausea medication as directed.  BELOW ARE SYMPTOMS THAT SHOULD BE REPORTED IMMEDIATELY: *FEVER GREATER THAN 100.4 F (38 C) OR HIGHER *CHILLS OR SWEATING *NAUSEA AND VOMITING THAT IS NOT CONTROLLED WITH YOUR NAUSEA MEDICATION *UNUSUAL SHORTNESS OF BREATH *UNUSUAL BRUISING OR BLEEDING *URINARY PROBLEMS (pain or burning when urinating, or frequent urination) *BOWEL PROBLEMS (unusual diarrhea, constipation, pain near the anus) TENDERNESS IN MOUTH AND THROAT WITH OR WITHOUT PRESENCE OF ULCERS (sore throat, sores in mouth, or a toothache) UNUSUAL RASH, SWELLING OR PAIN  UNUSUAL VAGINAL DISCHARGE OR ITCHING   Items with * indicate a potential emergency and should be followed up as soon as possible or go to the Emergency Department if any problems should occur.  Please show the CHEMOTHERAPY ALERT  CARD or IMMUNOTHERAPY ALERT CARD at check-in to the Emergency Department and triage nurse.  Should you have questions after your visit or need to cancel or reschedule your appointment, please contact [facility]  Dept: [phone]  and follow the prompts.  Office hours are 8:00 a.m. to 4:30 p.m. Monday - Friday. Please note that voicemails left after 4:00 p.m. may not be returned until the following business day.  We are closed weekends and major holidays. You have access to a nurse at all times for urgent questions. Please call the main number to the clinic Dept: [phone] and follow the prompts.   For any non-urgent questions, you may also contact your provider using MyChart. We now offer e-Visits for anyone 24 and older to request care online for non-urgent symptoms. For details visit [email or web address].   Also download the MyChart app! Go to the app store, search MyChart, open the app, select [facility], and log in with your MyChart username and password.

## 2024-02-05 ENCOUNTER — Encounter: Payer: Self-pay | Admitting: Hematology and Oncology

## 2024-02-05 ENCOUNTER — Encounter: Payer: Self-pay | Admitting: Nurse Practitioner

## 2024-02-08 ENCOUNTER — Inpatient Hospital Stay (HOSPITAL_BASED_OUTPATIENT_CLINIC_OR_DEPARTMENT_OTHER): Admitting: Hematology and Oncology

## 2024-02-08 ENCOUNTER — Inpatient Hospital Stay

## 2024-02-08 VITALS — BP 144/78 | HR 76 | Temp 97.2°F | Resp 15 | Wt 226.3 lb

## 2024-02-08 DIAGNOSIS — C679 Malignant neoplasm of bladder, unspecified: Secondary | ICD-10-CM | POA: Diagnosis not present

## 2024-02-08 DIAGNOSIS — Z5112 Encounter for antineoplastic immunotherapy: Secondary | ICD-10-CM

## 2024-02-08 DIAGNOSIS — Z95828 Presence of other vascular implants and grafts: Secondary | ICD-10-CM | POA: Diagnosis not present

## 2024-02-08 LAB — CBC WITH DIFFERENTIAL (CANCER CENTER ONLY)
Abs Immature Granulocytes: 0.04 K/uL (ref 0.00–0.07)
Basophils Absolute: 0 K/uL (ref 0.0–0.1)
Basophils Relative: 1 %
Eosinophils Absolute: 0.1 K/uL (ref 0.0–0.5)
Eosinophils Relative: 3 %
HCT: 25.3 % — ABNORMAL LOW (ref 39.0–52.0)
Hemoglobin: 8.5 g/dL — ABNORMAL LOW (ref 13.0–17.0)
Immature Granulocytes: 1 %
Lymphocytes Relative: 9 %
Lymphs Abs: 0.4 K/uL — ABNORMAL LOW (ref 0.7–4.0)
MCH: 30.2 pg (ref 26.0–34.0)
MCHC: 33.6 g/dL (ref 30.0–36.0)
MCV: 90 fL (ref 80.0–100.0)
Monocytes Absolute: 0.5 K/uL (ref 0.1–1.0)
Monocytes Relative: 11 %
Neutro Abs: 3.6 K/uL (ref 1.7–7.7)
Neutrophils Relative %: 75 %
Platelet Count: 154 K/uL (ref 150–400)
RBC: 2.81 MIL/uL — ABNORMAL LOW (ref 4.22–5.81)
RDW: 18.3 % — ABNORMAL HIGH (ref 11.5–15.5)
WBC Count: 4.7 K/uL (ref 4.0–10.5)
nRBC: 0.4 % — ABNORMAL HIGH (ref 0.0–0.2)

## 2024-02-08 LAB — CMP (CANCER CENTER ONLY)
ALT: 18 U/L (ref 0–44)
AST: 31 U/L (ref 15–41)
Albumin: 3.6 g/dL (ref 3.5–5.0)
Alkaline Phosphatase: 108 U/L (ref 38–126)
Anion gap: 7 (ref 5–15)
BUN: 14 mg/dL (ref 8–23)
CO2: 25 mmol/L (ref 22–32)
Calcium: 9.2 mg/dL (ref 8.9–10.3)
Chloride: 103 mmol/L (ref 98–111)
Creatinine: 2.01 mg/dL — ABNORMAL HIGH (ref 0.61–1.24)
GFR, Estimated: 35 mL/min — ABNORMAL LOW (ref >60)
Glucose, Bld: 194 mg/dL — ABNORMAL HIGH (ref 70–99)
Potassium: 4 mmol/L (ref 3.5–5.1)
Sodium: 135 mmol/L (ref 135–145)
Total Bilirubin: 0.9 mg/dL (ref 0.0–1.2)
Total Protein: 7.6 g/dL (ref 6.5–8.1)

## 2024-02-08 MED ORDER — SODIUM CHLORIDE 0.9 % IV SOLN: INTRAVENOUS | Status: DC

## 2024-02-08 MED ORDER — PROCHLORPERAZINE MALEATE 10 MG PO TABS
10.0000 mg | ORAL_TABLET | Freq: Once | ORAL | Status: AC
  Administered 2024-02-08: 10 mg via ORAL
  Filled 2024-02-08: qty 1

## 2024-02-08 MED ORDER — SODIUM CHLORIDE 0.9 % IV SOLN
125.0000 mg | Freq: Once | INTRAVENOUS | Status: AC
  Administered 2024-02-08: 125 mg via INTRAVENOUS
  Filled 2024-02-08: qty 10.5

## 2024-02-08 NOTE — Progress Notes (Signed)
 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 Federico  08/12/2022: Cycle 1 of Pembrolizumab  09/02/2022: Cycle 2 of Pembrolizumab   09/23/2022: Cycle 3 of Pembrolizumab  10/14/2022: Cycle 4 of Pembrolizumab  11/04/2022: Cycle 5 of Pembrolizumab  11/25/2022: Cycle 6 of Pembrolizumab  12/20/2022: Cycle 7 of Pembrolizumab  01/13/2023: Cycle 8 of Pembrolizumab   03/03/2023: Cystectomy/Prostatectomy showed infiltrative high-grade urothelial carcinoma, size 5.8 cm involving bladder dome, anterior wall and prostatic stroma. Tumor invades directly into prostatic stroma at apex and mid portion of the gland (pT4a). Metastatic urothelial cancer in one of two left common iliac lymph nodes.  06/19/2023-07/27/2023: Received adjuvant radiation to surgical bed.  He received 50.4 Gray in 28 fractions. 11/02/2023: Cycle 1 Day 1 of Enfortumab.  11/29/2023:  Cycle 2 Day 1 of Enfortumab.  12/28/2023: Cycle 3 Day 1 of Enfortumab.   CHIEF COMPLAINTS/PURPOSE OF CONSULTATION:  High Risk Non-Muscle Invasive Bladder Cancer   HISTORY OF PRESENTING ILLNESS:  Luis [name] 68 y.o. male with medical  history significant for BCG unresponsive high risk nonmuscle invasive bladder cancer who presents for a follow up visit.  He was last seen on 01/25/2024.  He presents today to continue Enfortumab treatment.   On exam today [name] reports he has had a rough few days.  He reports he is currently having diarrhea and is feeling quite tired.  He reports that he did have some trouble getting out of bed this morning and he feels like he is not eating enough.  His weight has dropped 9 pounds in the last 2 weeks.  He reports that he has had no issues with fevers, chills, sweats or nausea or vomiting.  Diarrhea has been his primary symptom.  He has not noticed any bleeding, bruising, or dark stools.  He does see some red in his urostomy bags on occasion.  He is doing his best to try to drink some ensures to help bolster his weight.  Overall he is willing and able to proceed with treatment today though we did offer him a holiday to recover from his diarrhea.  He voiced understanding of our findings and wanted to proceed.  A full 10 point ROS is otherwise negative.  MEDICAL HISTORY:  Past Medical History:  Diagnosis Date   Anxiety    takes Valium  as needed   Aortic stenosis, moderate    Arthritis    Ascending aortic aneurysm    CAD (coronary artery disease)    a. s/p PCI of the RCA 8/12 with DES by Dr Wonda, preserved EF. b. LHC/RHC (2/16) with mean RA 12, PA 32/15, mean PCWP 18, CI 3.47; patent mid and distal RCA stents, 50-60% proximal stenosis small PDA.      Cancer [facility])    bladder   Carotid stenosis    a. Carotid US  (  05/2013):  Bilateral 1-39% ICA; L thyroid  nodule (prior hx of aspiration).   Chronic diastolic CHF (congestive heart failure) (HCC)    Complication of anesthesia    difficulty waking up after gallbladder surgery   Depression    Dyspnea    Essential hypertension    GERD (gastroesophageal reflux disease)    if needed will take OTC meds    Heart murmur    History of colonic polyps     hyperplastic   Hyperlipidemia    Joint pain    Lesion of bladder    Myocardial infarction (HCC) 2012   Obesity (BMI 30-39.9) 02/29/2016   Pre-diabetes    Restless leg    Sleep apnea    uses cpap   Tubular adenoma of colon    Vertigo    takes Meclizine  as needed    SURGICAL HISTORY: Past Surgical History:  Procedure Laterality Date   AORTIC VALVE REPLACEMENT N/A 09/16/2021   Procedure: AORTIC VALVE REPLACEMENT (AVR);  Surgeon: Lucas Dorise POUR, MD;  Location: [facility] OR;  Service: Open Heart Surgery;  Laterality: N/A;   CATARACT EXTRACTION   4 YRS AGO   BOTH EYES   CHOLECYSTECTOMY  07/21/2011   Procedure: LAPAROSCOPIC CHOLECYSTECTOMY WITH INTRAOPERATIVE CHOLANGIOGRAM;  Surgeon: Alm VEAR Angle, MD;  Location: [facility];  Service: General;  Laterality: N/A;   CORONARY ANGIOPLASTY  2012   2 stents   coronary stenting     s/p PCI of the RCA by Dr Wonda 8/12 with 2 promus stents   CYSTOSCOPY W/ RETROGRADES Bilateral 12/13/2019   Procedure: CYSTOSCOPY WITH RETROGRADE PYELOGRAM;  Surgeon: Alvaro Hummer, MD;  Location: [facility];  Service: Urology;  Laterality: Bilateral;   CYSTOSCOPY W/ RETROGRADES Bilateral 10/14/2020   Procedure: CYSTOSCOPY WITH RETROGRADE PYELOGRAM;  Surgeon: Alvaro Hummer, MD;  Location: [facility];  Service: Urology;  Laterality: Bilateral;   CYSTOSCOPY W/ RETROGRADES Bilateral 12/16/2020   Procedure: CYSTOSCOPY WITH RETROGRADE PYELOGRAM;  Surgeon: Alvaro Hummer, MD;  Location: [facility];  Service: Urology;  Laterality: Bilateral;   CYSTOSCOPY W/ RETROGRADES Bilateral 06/03/2022   Procedure: CYSTOSCOPY WITH RETROGRADE PYELOGRAM;  Surgeon: Alvaro Hummer, MD;  Location: [facility];  Service: Urology;  Laterality: Bilateral;   CYSTOSCOPY W/ RETROGRADES Bilateral 12/28/2022   Procedure: CYSTOSCOPY WITH RETROGRADE PYELOGRAM, FULGARATION OF BLEEDERS;  Surgeon: Alvaro Hummer KATHEE Mickey., MD;  Location: [facility];  Service: Urology;   Laterality: Bilateral;   CYSTOSCOPY WITH INJECTION N/A 03/03/2023   Procedure: CYSTOSCOPY WITH INDOCYANINE INJECTION;  Surgeon: Alvaro Hummer KATHEE Mickey., MD;  Location: [facility];  Service: Urology;  Laterality: N/A;  360 MINUTES   ESOPHAGOGASTRODUODENOSCOPY N/A 06/28/2023   Procedure: EGD (ESOPHAGOGASTRODUODENOSCOPY);  Surgeon: Charlanne Groom, MD;  Location: [facility];  Service: Gastroenterology;  Laterality: N/A;   IR IMAGING GUIDED PORT INSERTION  05/29/2023   IR NEPHROSTOMY EXCHANGE LEFT  08/07/2023   IR NEPHROSTOMY EXCHANGE LEFT  10/02/2023   IR NEPHROSTOMY EXCHANGE LEFT  11/27/2023   IR NEPHROSTOMY EXCHANGE LEFT  01/10/2024   IR NEPHROSTOMY PLACEMENT RIGHT  07/01/2023   IR RADIOLOGY PERIPHERAL GUIDED IV START  07/01/2023   IR US  GUIDE VASC ACCESS LEFT  07/01/2023   LEFT AND RIGHT HEART CATHETERIZATION WITH CORONARY ANGIOGRAM N/A 06/23/2014   Procedure: LEFT AND RIGHT HEART CATHETERIZATION WITH CORONARY ANGIOGRAM;  Surgeon: Ezra GORMAN Shuck, MD;  Location: [facility] CATH LAB;  Service: Cardiovascular;  Laterality: N/A;   NECK SURGERY  03/23/09   per Dr. Barbarann, cervical fusion  PERICARDIOCENTESIS N/A 09/28/2021   Procedure: PERICARDIOCENTESIS;  Surgeon: Dann Candyce RAMAN, MD;  Location: [facility] INVASIVE CV LAB;  Service: Cardiovascular;  Laterality: N/A;   REPLACEMENT ASCENDING AORTA N/A 09/16/2021   Procedure: REPLACEMENT ASCENDING AORTA WITH 30 X HEMASHIELD PLATINUM WOVEN DOUBLE VELOUR VASCULAR GRAFT;  Surgeon: Lucas Dorise POUR, MD;  Location: [facility] OR;  Service: Open Heart Surgery;  Laterality: N/A;  CIRC ARREST   right elbow surgery     RIGHT HEART CATH N/A 06/15/2023   Procedure: RIGHT HEART CATH;  Surgeon: Rolan Ezra RAMAN, MD;  Location: [facility] INVASIVE CV LAB;  Service: Cardiovascular;  Laterality: N/A;   RIGHT HEART CATH AND CORONARY ANGIOGRAPHY N/A 07/08/2021   Procedure: RIGHT HEART CATH AND CORONARY ANGIOGRAPHY;  Surgeon: Rolan Ezra RAMAN, MD;  Location: [facility] INVASIVE CV LAB;  Service: Cardiovascular;  Laterality:  N/A;   ROBOT ASSISTED LAPAROSCOPIC COMPLETE CYSTECT ILEAL CONDUIT N/A 03/03/2023   Procedure: XI ROBOTIC ASSISTED LAPAROSCOPIC COMPLETE CYSTECTECTOMY WITH  ILEAL CONDUIT DIVERSION;  Surgeon: Alvaro Ricardo KATHEE Mickey., MD;  Location: [facility];  Service: Urology;  Laterality: N/A;   ROBOT ASSISTED LAPAROSCOPIC RADICAL PROSTATECTOMY N/A 03/03/2023   Procedure: XI ROBOTIC ASSISTED LAPAROSCOPIC RADICAL PROSTATECTOMY WITH LYMPH NODE DISSECTION;  Surgeon: Alvaro Ricardo KATHEE Mickey., MD;  Location: [facility];  Service: Urology;  Laterality: N/A;   solonscopy  05/23/08   per Dr. Jakie inch hemorrhoids only, repeat in 5 years   SUBXYPHOID PERICARDIAL WINDOW N/A 09/28/2021   Procedure: SUBXYPHOID PERICARDIAL WINDOW;  Surgeon: Lucas Dorise POUR, MD;  Location: [facility] OR;  Service: Thoracic;  Laterality: N/A;   TEE WITHOUT CARDIOVERSION N/A 06/23/2014   Procedure: TRANSESOPHAGEAL ECHOCARDIOGRAM (TEE);  Surgeon: Ezra RAMAN Rolan, MD;  Location: [facility] ENDOSCOPY;  Service: Cardiovascular;  Laterality: N/A;   TEE WITHOUT CARDIOVERSION N/A 01/21/2016   Procedure: TRANSESOPHAGEAL ECHOCARDIOGRAM (TEE);  Surgeon: Ezra RAMAN Rolan, MD;  Location: [facility] ENDOSCOPY;  Service: Cardiovascular;  Laterality: N/A;   TEE WITHOUT CARDIOVERSION N/A 09/16/2021   Procedure: TRANSESOPHAGEAL ECHOCARDIOGRAM (TEE);  Surgeon: Lucas Dorise POUR, MD;  Location: [facility] OR;  Service: Open Heart Surgery;  Laterality: N/A;   TEE WITHOUT CARDIOVERSION N/A 09/28/2021   Procedure: TRANSESOPHAGEAL ECHOCARDIOGRAM (TEE);  Surgeon: Lucas Dorise POUR, MD;  Location: [facility] OR;  Service: Thoracic;  Laterality: N/A;   TONSILLECTOMY     TRANSESOPHAGEAL ECHOCARDIOGRAM (CATH LAB) N/A 04/17/2023   Procedure: TRANSESOPHAGEAL ECHOCARDIOGRAM;  Surgeon: Jeffrie Oneil BROCKS, MD;  Location: [facility] INVASIVE CV LAB;  Service: Cardiovascular;  Laterality: N/A;   TRANSURETHRAL RESECTION OF BLADDER TUMOR N/A 10/21/2019   Procedure: TRANSURETHRAL RESECTION OF BLADDER TUMOR (TURBT);  Surgeon: Ottelin, Mark, MD;   Location: [facility];  Service: Urology;  Laterality: N/A;   TRANSURETHRAL RESECTION OF BLADDER TUMOR N/A 12/13/2019   Procedure: TRANSURETHRAL RESECTION OF BLADDER TUMOR (TURBT);  Surgeon: Alvaro Ricardo, MD;  Location: [facility];  Service: Urology;  Laterality: N/A;  1 HR   TRANSURETHRAL RESECTION OF BLADDER TUMOR N/A 10/14/2020   Procedure: TRANSURETHRAL RESECTION OF BLADDER TUMOR (TURBT);  Surgeon: Alvaro Ricardo, MD;  Location: [facility];  Service: Urology;  Laterality: N/A;   TRANSURETHRAL RESECTION OF BLADDER TUMOR N/A 12/16/2020   Procedure: RESTAGING TRANSURETHRAL RESECTION OF BLADDER TUMOR (TURBT);  Surgeon: Alvaro Ricardo, MD;  Location: [facility];  Service: Urology;  Laterality: N/A;   TRANSURETHRAL RESECTION OF BLADDER TUMOR N/A 06/03/2022   Procedure: TRANSURETHRAL RESECTION OF BLADDER TUMOR (TURBT);  Surgeon: Alvaro Ricardo, MD;  Location: [facility];  Service:  Urology;  Laterality: N/A;   UMBILICAL HERNIA REPAIR  03/03/2023   Procedure: HERNIA REPAIR UMBILICAL;  Surgeon: Alvaro Ricardo KATHEE Mickey., MD;  Location: [facility];  Service: Urology;;    SOCIAL HISTORY: Social History   Socioeconomic History   Marital status: Married    Spouse name: Not on file   Number of children: 4   Years of education: Not on file   Highest education level: Not on file  Occupational History   Occupation: Public House Manager: [employer]  Tobacco Use   Smoking status: Former    Current packs/day: 0.00    Average packs/day: 2.0 packs/day for 40.0 years (80.0 ttl pk-yrs)    Types: Cigarettes    Start date: 61    Quit date: 2012    Years since quitting: 13.8    Passive exposure: Never   Smokeless tobacco: Never  Vaping Use   Vaping status: Never Used  Substance and Sexual Activity   Alcohol use: No    Alcohol/week: 0.0 standard drinks of alcohol   Drug use: No   Sexual activity: Not on file  Other Topics  Concern   Not on file  Social History Narrative   Not on file   Social Drivers of Health   Financial Resource Strain: Low Risk  (02/15/2024)   Overall Financial Resource Strain (CARDIA)    Difficulty of Paying Living Expenses: Not hard at all  Food Insecurity: No Food Insecurity (02/15/2024)   Hunger Vital Sign    Worried About Running Out of Food in the Last Year: Never true    Ran Out of Food in the Last Year: Never true  Transportation Needs: No Transportation Needs (02/15/2024)   PRAPARE - Administrator, Civil Service (Medical): No    Lack of Transportation (Non-Medical): No  Physical Activity: Inactive (02/15/2024)   Exercise Vital Sign    Days of Exercise per Week: 0 days    Minutes of Exercise per Session: Not on file  Stress: Stress Concern Present (02/15/2024)   [employer] of Occupational Health - Occupational Stress Questionnaire    Feeling of Stress: To some extent  Social Connections: Unknown (02/15/2024)   Social Connection and Isolation Panel    Frequency of Communication with Friends and Family: Once a week    Frequency of Social Gatherings with Friends and Family: More than three times a week    Attends Religious Services: Patient declined    Database Administrator or Organizations: Patient declined    Attends Banker Meetings: Not on file    Marital Status: Married  Intimate Partner Violence: Not At Risk (07/04/2023)   Humiliation, Afraid, Rape, and Kick questionnaire    Fear of Current or Ex-Partner: No    Emotionally Abused: No    Physically Abused: No    Sexually Abused: No    FAMILY HISTORY: Family History  Problem Relation Age of Onset   Lung cancer Mother        lung   Esophageal cancer Cousin    Colon cancer Neg Hx    Rectal cancer Neg Hx    Stomach cancer Neg Hx     ALLERGIES:  has no known allergies.  MEDICATIONS:  Current Outpatient Medications  Medication Sig Dispense Refill   acetaminophen  (TYLENOL )  325 MG tablet Take 650 mg by mouth every 6 (six) hours as needed (for pain).     amLODipine  (NORVASC ) 5 MG tablet TAKE 1 TABLET (  5 MG TOTAL) BY MOUTH DAILY. 90 tablet 1   aspirin  EC 81 MG tablet Take 81 mg by mouth in the morning.     carvedilol  (COREG ) 3.125 MG tablet TAKE 1 TABLET BY MOUTH TWICE A DAY WITH FOOD 180 tablet 1   cyanocobalamin  (VITAMIN B12) 1000 MCG tablet Take 1 tablet (1,000 mcg total) by mouth daily. 30 tablet 6   diazepam  (VALIUM ) 5 MG tablet TAKE 1 TABLET BY MOUTH EVERY 12 HOURS AS NEEDED FOR ANXIETY. 60 tablet 5   famotidine  (PEPCID ) 20 MG tablet Take 1 tablet (20 mg total) by mouth 2 (two) times daily for 6 days. (Patient taking differently: Take 20 mg by mouth 2 (two) times daily. As needed) 12 tablet 0   ferrous sulfate  325 (65 FE) MG tablet Take 1 tablet (325 mg total) by mouth daily with breakfast. Please take with a source of Vitamin C 90 tablet 3   furosemide  (LASIX ) 20 MG tablet TAKE 3 TABLETS BY MOUTH EVERY MORNING AND TAKE 2 TABLETS BY MOUTH EVERY EVENING 150 tablet 3   ipratropium (ATROVENT ) 0.03 % nasal spray Place 2 sprays into both nostrils every 12 (twelve) hours as needed for rhinitis.     lidocaine -prilocaine  (EMLA ) cream Apply topically once a week. 30 g 0   Magnesium  500 MG TABS Take 500 mg by mouth every morning.     meclizine  (ANTIVERT ) 25 MG tablet Take 1 tablet (25 mg total) by mouth 3 (three) times daily as needed for dizziness. 30 tablet 0   mirtazapine (REMERON) 7.5 MG tablet TAKE 1 TABLET BY MOUTH AT BEDTIME. 90 tablet 1   multivitamin-iron-minerals-folic acid  (CENTRUM) chewable tablet Chew 1 tablet by mouth daily.     nitroGLYCERIN  (NITROSTAT ) 0.4 MG SL tablet Place 0.4 mg under the tongue every 5 (five) minutes as needed for chest pain.     ondansetron  (ZOFRAN ) 8 MG tablet Take 1 tablet (8 mg total) by mouth every 8 (eight) hours as needed. 30 tablet 0   pantoprazole  (PROTONIX ) 40 MG tablet TAKE 1 TABLET (40 MG TOTAL) BY MOUTH TWICE A DAY BEFORE  MEALS 180 tablet 1   potassium chloride  SA (KLOR-CON  M20) 20 MEQ tablet Take 1 tablet (20 mEq total) by mouth daily. (Patient taking differently: Take 20 mEq by mouth 2 (two) times daily.)     prochlorperazine  (COMPAZINE ) 10 MG tablet Take 1 tablet (10 mg total) by mouth every 6 (six) hours as needed for nausea or vomiting. 30 tablet 0   rosuvastatin  (CRESTOR ) 20 MG tablet Take 1 tablet (20 mg total) by mouth daily. 90 tablet 3   temazepam  (RESTORIL ) 30 MG capsule Take 1 capsule (30 mg total) by mouth at bedtime as needed for sleep. 90 capsule 1   traMADol  (ULTRAM ) 50 MG tablet Take 1 tablet (50 mg total) by mouth every 6 (six) hours as needed. 30 tablet 0   venlafaxine  XR (EFFEXOR -XR) 150 MG 24 hr capsule Take 1 capsule (150 mg total) by mouth daily with breakfast. 90 capsule 3   No current facility-administered medications for this visit.    REVIEW OF SYSTEMS:   Constitutional: ( - ) fevers, ( - )  chills , ( - ) night sweats Eyes: ( - ) blurriness of vision, ( - ) double vision, ( - ) watery eyes Ears, nose, mouth, throat, and face: ( - ) mucositis, ( - ) sore throat Respiratory: ( - ) cough, ( - ) dyspnea, ( - ) wheezes Cardiovascular: ( - )  palpitation, ( - ) chest discomfort, ( - ) lower extremity swelling Gastrointestinal:  ( - ) nausea, ( - ) heartburn, ( - ) change in bowel habits Skin: ( - ) abnormal skin rashes Lymphatics: ( - ) new lymphadenopathy, ( - ) easy bruising Neurological: ( - ) numbness, ( - ) tingling, ( - ) new weaknesses Behavioral/Psych: ( - ) mood change, ( - ) new changes  All other systems were reviewed with the patient and are negative.  PHYSICAL EXAMINATION: ECOG PERFORMANCE STATUS: 1 - Symptomatic but completely ambulatory  Vitals:   02/08/24 1354  BP: (!) 144/78  Pulse: 76  Resp: 15  Temp: (!) 97.2 F (36.2 C)  SpO2: 100%      Filed Weights   02/08/24 1354  Weight: 226 lb 4.8 oz (102.6 kg)       GENERAL: well appearing elderly Caucasian  male in NAD  SKIN: skin color, texture, turgor are normal, no rashes or significant lesions EYES: conjunctiva are pink and non-injected, sclera clear LUNGS: clear to auscultation and percussion with normal breathing effort HEART: regular rate & rhythm and no murmurs and no lower extremity edema Musculoskeletal: no cyanosis of digits and no clubbing.  PSYCH: alert & oriented x 3, fluent speech NEURO: no focal motor/sensory deficits  LABORATORY DATA:  I have reviewed the data as listed    Latest Ref Rng & Units 02/08/2024    1:21 PM 02/01/2024    1:59 PM 01/25/2024   10:13 AM  CBC  WBC 4.0 - 10.5 K/uL 4.7  5.3  5.6   Hemoglobin 13.0 - 17.0 g/dL 8.5  8.7  8.8   Hematocrit 39.0 - 52.0 % 25.3  26.6  27.0   Platelets 150 - 400 K/uL 154  184  199        Latest Ref Rng & Units 02/08/2024    1:21 PM 02/01/2024    1:59 PM 01/25/2024   10:13 AM  CMP  Glucose 70 - 99 mg/dL 805  863  836   BUN 8 - 23 mg/dL 14  20  23    Creatinine 0.61 - 1.24 mg/dL 7.98  8.23  7.94   Sodium 135 - 145 mmol/L 135  136  135   Potassium 3.5 - 5.1 mmol/L 4.0  4.0  4.4   Chloride 98 - 111 mmol/L 103  104  102   CO2 22 - 32 mmol/L 25  27  27    Calcium  8.9 - 10.3 mg/dL 9.2  9.0  8.3    8.8   Total Protein 6.5 - 8.1 g/dL 7.6  7.8  7.9   Total Bilirubin 0.0 - 1.2 mg/dL 0.9  0.6  0.8   Alkaline Phos 38 - 126 U/L 108  95  105   AST 15 - 41 U/L 31  18  18    ALT 0 - 44 U/L 18  11  14      RADIOGRAPHIC STUDIES: CT CHEST ABDOMEN PELVIS W CONTRAST Result Date: 01/22/2024 CLINICAL DATA:  Invasive bladder cancer, assess treatment response. * Tracking Code: BO * EXAM: CT CHEST, ABDOMEN, AND PELVIS WITH CONTRAST TECHNIQUE: Multidetector CT imaging of the chest, abdomen and pelvis was performed following the standard protocol during bolus administration of intravenous contrast. RADIATION DOSE REDUCTION: This exam was performed according to the departmental dose-optimization program which includes automated exposure control,  adjustment of the mA and/or kV according to patient size and/or use of iterative reconstruction technique. CONTRAST:  75mL OMNIPAQUE  IOHEXOL  300  MG/ML  SOLN COMPARISON:  Multiple priors including PET-CT September 25, 2023 FINDINGS: CT CHEST FINDINGS Cardiovascular: Right chest Port-A-Cath with tip near the superior cavoatrial junction. Aortic atherosclerosis. Calcifications of the aortic valve. Prior median sternotomy and CABG. Normal size heart. No significant pericardial effusion/thickening. Mediastinum/Nodes: 12 mm nodule in the left lobe of the thyroid  on image 7/2 requiring no independent imaging follow-up. No pathologically enlarged mediastinal, hilar or axillary lymph nodes. Esophagus is grossly unremarkable. Lungs/Pleura: 5 mm pulmonary nodule seen on prior PET-CT is not seen on today's examination. No suspicious pulmonary nodules or masses. Scattered atelectasis/scarring. Musculoskeletal: No aggressive lytic or blastic lesion of bone. Multilevel degenerative change of the spine. CT ABDOMEN PELVIS FINDINGS Hepatobiliary: Diffuse hepatic steatosis. No suspicious hepatic lesion. Gallbladder surgically absent. No biliary ductal dilation. Pancreas: No pancreatic ductal dilation or evidence of acute inflammation. Spleen: No splenomegaly. Adrenals/Urinary Tract: No suspicious adrenal nodule/mass. Left renal cyst. Bilateral percutaneous nephrostomy tubes, right-sided tube is retracted into and interpolar renal calyx with new mild hydronephrosis. Pigtail of the left tube is sitting in the central collecting system. New mild prominence of the left collecting system. Bilateral peripelvic stranding. Prominence of the right ureter to the level of the ileal pouch. Urinary bladder is surgically absent with right anterior abdominal wall urostomy formation. Small fat containing peristomal hernia. Stomach/Bowel: Stomach is unremarkable for degree of distension. No pathologic dilation of small or large bowel. Postsurgical changes  of prior ileal pouch urinary diversion. Vascular/Lymphatic: Aortic atherosclerosis. Decreased size of the left periaortic lymph nodes indexed lymph node measures 9 mm on image 74/2 previously 2 cm the other measures 6 mm in short axis on image 70/2 previously 14 mm. Reproductive: Prostate gland is surgically absent. Other: Mild peritoneal thickening. Mild stranding in the mesentery and pelvis. Musculoskeletal: No aggressive lytic or blastic lesion of bone. Thoracolumbar spondylosis. IMPRESSION: 1. Decreased size of the left periaortic lymph nodes, consistent with treatment response. 2. Bilateral percutaneous nephrostomy tubes, right-sided tube is retracted into an interpolar renal calyx with new mild hydroureteronephrosis. Pigtail of the left tube is sitting in the central collecting system. New mild prominence of the left collecting system. Suggest evaluation of stents for patency/functionality and urinalysis. 3. Postsurgical changes of prior cystoprostatectomy with right anterior abdominal wall urostomy formation. 4. Diffuse hepatic steatosis. These results will be called to the ordering clinician or representative by the Radiologist Assistant, and communication documented in the PACS or [employer]. Electronically Signed   By: Reyes Holder M.D.   On: 01/22/2024 15:52     ASSESSMENT & PLAN Luis [name] 68 y.o. 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  After review of the labs, review of the records, and discussion with the patient the patients findings are most consistent with BCG unresponsive high risk non-muscle invasive bladder cancer, not a candidate for cystectomy.  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 21 days until progression or intolerance up to 24 months. --Received 8  cycles of Pembrolizumab  on 08/12/2022-01/13/2023: Cycle 8 of Pembrolizumab   --On 03/03/2023, underwent Cystectomy/Prostatectomy showed infiltrative high-grade urothelial carcinoma, size 5.8 cm involving bladder dome, anterior wall and prostatic stroma. Tumor invades directly into prostatic stroma at apex and mid portion of the gland (pT4a). Metastatic urothelial cancer in one of two left common iliac lymph nodes.  -- Given the results of the pathology  showing positive margins and spread to the lymph node we are considering radiation therapy versus chemotherapy.  For chemotherapy, recommend gemcitabine  and cisplatin x 4 cycles.  This would consist of gemcitabine  1000 mg/m on days 1 and 8 and cisplatin 70 mg/m on day 1 of 21-day cycle for 4 cycles. -- Due to kidney dysfunction and anemia, chemotherapy was deferred and patient underwent adjuvant radiation to the surgical bed from 06/19/2023 until 07/27/2023.  He received 50.4 Gray in 28 fractions. PLAN:  --Today is Cycle 3, Day 15 of Enfortumab therapy --Labs from today show white blood cell 4.7, hemoglobin 8.5, MCV 90, platelets 154 creatinine stable at 2.01, LFTs within normal limits --Due for CT CAP in Dec 2025. CT scan on 01/22/2024 showed response to treatment with no evidence of progression.  --Proceed with treatment today without any dose modifications.  --RTC weekly for treatment and every 2 weeks for clinic visit.  # Skin Rash -- Occurred after receiving blood transfusion, etiology not entirely clear. -- Patient has been applying moisturizer to the skin and has been taking Benadryl . -- Skin is persistently dry and itchy and erythematous. -- Completed steroid taper with 60 mg for 5 days, 40 for 5 days, and 20 for 5 days.  #Anemia --Hemoglobin 8.5  --May be multifactorial due to nutritional deficiency as well as kidney dysfunction. -- Nutritional labs show borderline vitamin B12 deficiency and iron deficiency. -- Patient is currently taking  vitamin B12 p.o. daily and ferrous sulfate  every other day. -- Receives vitamin B12 1000 mcg injection monthly, last received on 01/04/2024. -- Last received IV monoferric  1000 mg x 1 dose on 08/25/2023.  -- Will recheck vitamin B12 and iron levels are next visit.   #Hypokalemia: --Potassium level is 4.0 today --Patient is currently taking potassium supplement 20 meq BID. Advised to continue  #Flank pain--improved: --Secondary to bilateral nephrostomy. Patient is currently taking tylenol  with minimal improvement.  --Has prescription for tramadol  50 mg PO q 6 hours as needed.   # AKI -- Currently has bilateral nephrostomies, underwent exchange on yesterday, 01/10/2024. --monitoring Cr at each visit.   #Supportive Care -- chemotherapy education complete -- port placed -- zofran  8mg  q8H PRN and compazine  10mg  PO q6H for nausea -- EMLA  cream for port -- no pain medication required at this time.    No orders of the defined types were placed in this encounter.  All questions were answered. The patient knows to call the clinic with any problems, questions or concerns.  I have spent a total of 30 minutes minutes of face-to-face and non-face-to-face time, preparing to see the patient, performing a medically appropriate examination, counseling and educating the patient, documenting clinical information in the electronic health record,and care coordination.   Norleen IVAR Kidney, MD Department of Hematology/Oncology [facility] Phone: [phone] Pager: [phone] Email: [email or web address]

## 2024-02-08 NOTE — Patient Instructions (Signed)
 [facility] - A DEPT OF [facility]   Discharge Instructions: Thank you for choosing [facility] to provide your oncology and hematology care.   If you have a lab appointment with the Cancer Center, please go directly to the Cancer Center and check in at the registration area.   Wear comfortable clothing and clothing appropriate for easy access to any Portacath or PICC line.   We strive to give you quality time with your provider. You may need to reschedule your appointment if you arrive late (15 or more minutes).  Arriving late affects you and other patients whose appointments are after yours.  Also, if you miss three or more appointments without notifying the office, you may be dismissed from the clinic at the provider's discretion.      For prescription refill requests, have your pharmacy contact our office and allow 72 hours for refills to be completed.    Today you received the following chemotherapy and/or immunotherapy agents: Enfortumumab vedotin (Padcev )      To help prevent nausea and vomiting after your treatment, we encourage you to take your nausea medication as directed.  BELOW ARE SYMPTOMS THAT SHOULD BE REPORTED IMMEDIATELY: *FEVER GREATER THAN 100.4 F (38 C) OR HIGHER *CHILLS OR SWEATING *NAUSEA AND VOMITING THAT IS NOT CONTROLLED WITH YOUR NAUSEA MEDICATION *UNUSUAL SHORTNESS OF BREATH *UNUSUAL BRUISING OR BLEEDING *URINARY PROBLEMS (pain or burning when urinating, or frequent urination) *BOWEL PROBLEMS (unusual diarrhea, constipation, pain near the anus) TENDERNESS IN MOUTH AND THROAT WITH OR WITHOUT PRESENCE OF ULCERS (sore throat, sores in mouth, or a toothache) UNUSUAL RASH, SWELLING OR PAIN  UNUSUAL VAGINAL DISCHARGE OR ITCHING   Items with * indicate a potential emergency and should be followed up as soon as possible or go to the Emergency Department if any problems should occur.  Please show the CHEMOTHERAPY ALERT  CARD or IMMUNOTHERAPY ALERT CARD at check-in to the Emergency Department and triage nurse.  Should you have questions after your visit or need to cancel or reschedule your appointment, please contact [facility] - A DEPT OF [facility]  Dept: [phone]  and follow the prompts.  Office hours are 8:00 a.m. to 4:30 p.m. Monday - Friday. Please note that voicemails left after 4:00 p.m. may not be returned until the following business day.  We are closed weekends and major holidays. You have access to a nurse at all times for urgent questions. Please call the main number to the clinic Dept: [phone] and follow the prompts.   For any non-urgent questions, you may also contact your provider using MyChart. We now offer e-Visits for anyone 51 and older to request care online for non-urgent symptoms. For details visit [email or web address].   Also download the MyChart app! Go to the app store, search MyChart, open the app, select , and log in with your MyChart username and password.

## 2024-02-09 ENCOUNTER — Other Ambulatory Visit: Payer: Self-pay

## 2024-02-10 ENCOUNTER — Other Ambulatory Visit: Payer: Self-pay | Admitting: Family Medicine

## 2024-02-11 ENCOUNTER — Other Ambulatory Visit: Payer: Self-pay

## 2024-02-12 DIAGNOSIS — Z515 Encounter for palliative care: Secondary | ICD-10-CM | POA: Diagnosis not present

## 2024-02-12 DIAGNOSIS — N131 Hydronephrosis with ureteral stricture, not elsewhere classified: Secondary | ICD-10-CM | POA: Diagnosis not present

## 2024-02-12 DIAGNOSIS — C674 Malignant neoplasm of posterior wall of bladder: Secondary | ICD-10-CM | POA: Diagnosis not present

## 2024-02-15 ENCOUNTER — Ambulatory Visit (INDEPENDENT_AMBULATORY_CARE_PROVIDER_SITE_OTHER): Admitting: Family Medicine

## 2024-02-15 DIAGNOSIS — Z0000 Encounter for general adult medical examination without abnormal findings: Secondary | ICD-10-CM | POA: Diagnosis not present

## 2024-02-15 NOTE — Patient Instructions (Addendum)
 I really enjoyed getting to talk with you today! I am available on Tuesdays and Thursdays for virtual visits if you have any questions or concerns, or if I can be of any further assistance.   CHECKLIST FROM ANNUAL WELLNESS VISIT:  -Follow up (please call to schedule if not scheduled after visit):   -ask to schedule counseling through the cancer center for sleep and anxiety, or can call number below for [place]   -follow up with Dr. Johnny if needed for further adjustment in medications   -yearly for annual wellness visit with primary care office  Here is a list of your preventive care/health maintenance measures and the plan for each if any are due:  PLAN For any measures below that may be due:   Health Maintenance  Topic Date Due   Hepatitis C Screening  Never done   Zoster Vaccines- Shingrix (1 of 2) Never done   Medicare Annual Wellness (AWV)  11/03/2023   COVID-19 Vaccine (4 - 2025-26 season) 12/25/2023   Influenza Vaccine  01/23/2025 (Originally 11/24/2023)   Lung Cancer Screening  01/21/2025   Diabetic kidney evaluation - Urine ACR  01/24/2025   Diabetic kidney evaluation - eGFR measurement  02/07/2025   Colonoscopy  10/03/2025   DTaP/Tdap/Td (2 - Td or Tdap) 11/24/2033   Pneumococcal Vaccine: 50+ Years  Completed   Meningococcal B Vaccine  Aged Out    -See a dentist at least yearly  -Get your eyes checked and then per your eye specialist's recommendations  -Other issues addressed today:   1. Small regular meals even if just a few bites of protein and veggies, fruits at a time - this is better than nothing. Soups and smoothies are easier for some.    2. Try to move on a regular basis, a few minutes here and a few minutes there - chair exercises with arms and legs and walking in place while watching TV.    3. Sleep information below.   -I have included below further information regarding a healthy whole foods based diet, physical activity guidelines for adults, stress  management and opportunities for social connections. I hope you find this information useful.   -----------------------------------------------------------------------------------------------------------------------------------------------------------------------------------------------------------------------------------------------------------    NUTRITION: -eat real food: lots of colorful vegetables (half the plate) and fruits -5-7 servings of vegetables and fruits per day (fresh or steamed is best), exp. 2 servings of vegetables with lunch and dinner and 2 servings of fruit per day. Berries and greens such as kale and collards are great choices.  -consume on a regular basis:  fresh fruits, fresh veggies, fish, nuts, seeds, healthy oils (such as olive oil, avocado oil), whole grains (make sure for bread/pasta/crackers/etc., that the first ingredient on label contains the word whole), legumes. -can eat small amounts of dairy and lean meat (no larger than the palm of your hand), but avoid processed meats such as ham, bacon, lunch meat, etc. -drink water  -try to avoid fast food and pre-packaged foods, processed meat, ultra processed foods/beverages (donuts, candy, etc.) -most experts advise limiting sodium to < 2300mg  per day, should limit further is any chronic conditions such as high blood pressure, heart disease, diabetes, etc. The American Heart Association advised that < 1500mg  is is ideal -try to avoid foods/beverages that contain any ingredients with names you do not recognize  -try to avoid foods/beverages  with added sugar or sweeteners/sweets  -try to avoid sweet drinks (including diet drinks): soda, juice, Gatorade, sweet tea, power drinks, diet drinks -try to avoid  white rice, white bread, pasta (unless whole grain)  EXERCISE GUIDELINES FOR ADULTS: -if you wish to increase your physical activity, do so gradually and with the approval of your doctor -STOP and seek medical care  immediately if you have any chest pain, chest discomfort or trouble breathing when starting or increasing exercise  -move and stretch your body, legs, feet and arms when sitting for long periods -Physical activity guidelines for optimal health in adults: -get at least 150 minutes per week of moderate exercise (can talk, but not sing); this is about 20-30 minutes of sustained activity 5-7 days per week or two 10-15 minute episodes of sustained activity 5-7 days per week -do some muscle building/resistance training/strength training at least 2 days per week  -balance exercises 3+ days per week:   Stand somewhere where you have something sturdy to hold onto if you lose balance    1) lift up on toes, then back down, start with 5x per day and work up to 20x   2) stand and lift one leg straight out to the side so that foot is a few inches of the floor, start with 5x each side and work up to 20x each side   3) stand on one foot, start with 5 seconds each side and work up to 20 seconds on each side  If you need ideas or help with getting more active:  -Silver sneakers https://tools.silversneakers.com  -Walk with a Doc: http://www.duncan-williams.com/  -try to include resistance (weight lifting/strength building) and balance exercises twice per week: or the following link for ideas: http://castillo-powell.com/  BuyDucts.dk  STRESS MANAGEMENT: -can try meditating, or just sitting quietly with deep breathing while intentionally relaxing all parts of your body for 5 minutes daily -if you need further help with stress, anxiety or depression please follow up with your primary doctor or contact the wonderful folks at [facility]: [phone]  SOCIAL CONNECTIONS: -options in [place] if you wish to engage in more social and exercise related activities:  -Silver  sneakers https://tools.silversneakers.com  -Walk with a Doc: http://www.duncan-williams.com/  -Check out the [facility] Active Adults 50+ section on the [place] [employer] (hiking clubs, book clubs, cards and games, chess, exercise classes, aquatic classes and much more) - see the website for details: https://www.[place].gov/departments/parks-recreation/active-adults50  -YouTube has lots of exercise videos for different ages and abilities as well  -[facility] (a variety of indoor and outdoor inperson activities for adults). [phone]. [address]  -Virtual Online Classes (a variety of topics): see seniorplanet.org or call [phone]  -consider volunteering at a school, hospice center, church, senior center or elsewhere     FOR IMPROVED SLEEP AND TO RESET YOUR SLEEP SCHEDULE:  []  Schedule sleep counseling(cognitive behavioral therapy).    Behavioral Health is a good option.   Call for appointment: [phone]  []  Exercise 30 minutes daily. Some people do better with exercise in the morning, other do better exercising later in the day.  []  Avoid caffeine and alcohol - particularly in the evenings. For some people even a little alcohol can impair sleep.   []  Go to bed and wake up at the same time everyday (within a 30 minute window). When you get up in the morning for your designated wake time - turn on lights and open curtains right away. This will help to set your internal sleep clock.   []  Keep bedroom cool, dark and quiet - if you have to get up at night make sure there is  no white light from street lamps, night lights, lights, clock, devices etc. that enters your eyes. Instead, use red light flashlight or night lights and avoid looking directly at the light while up.   []  Set 1-2 hour bedtime routine, dim lights, avoid screens (phones, computers, TVs, etc during this time), consider sleepytime tea, warm bath or shower, avoid alcohol and  caffeine  []  Reserve bed for sleep - do not read, watch TV, look at phone or device, etc., in bed.  []  If you toss and turn for more then 10-15 minutes, get out of bed and list or journal thoughts or do quiet activity (not screen-time, no tv, phone, computer) then go back to bed. Repeat as needed. Try not to worry about when you will eventually fall asleep.  []  Some people find that a half dose of benadryl , melatonin, tylenol  pm or unisom on a few nights per week is helpful initially for a few weeks.  []  Seek help for any depression or anxiety.  [] Prescription strength sleep medications should only be used in severe cases of insomnia if other measures fail and should be used sparingly.  I hope you are feeling better soon! Follow up with your doctor in 3-4 weeks or sooner if your symptoms worsen or new concerns arise.

## 2024-02-15 NOTE — Progress Notes (Signed)
 PATIENT CHECK-IN and HEALTH RISK ASSESSMENT QUESTIONNAIRE:  -completed by phone/video for upcoming Medicare Preventive Visit  Pre-Visit Check-in: 1)Vitals (height, wt, BP, etc) - record in vitals section for visit on day of visit Request home vitals (wt, BP, etc.) and enter into vitals, THEN update Vital Signs SmartPhrase below at the top of the HPI. See below.  2)Review and Update Medications, Allergies PMH, Surgeries, Social history in Epic 3)Hospitalizations in the last year with date/reason? In   4)Review and Update Care Team (patient's specialists) in Epic 5) Complete PHQ9 in Epic  6) Complete Fall Screening in Epic 7)Review all Health Maintenance Due and order if not done.  Medicare Wellness Patient Questionnaire:  Answer theses question about your habits: How often do you have a drink containing alcohol?never How many drinks containing alcohol do you have on a typical day when you are drinking?na How often do you have six or more drinks on one occasion?na Have you ever smoked?y Quit date if applicable? 14 years ago  How many packs a day do/did you smoke? 1-2 Do you use smokeless tobacco?n Do you use an illicit drugs?n On average, how many days per week do you engage in moderate to strenuous exercise (like a brisk walk)?n On average, how many minutes do you engage in exercise at this level?na Typical diet: poor appetite right now  Beverages: water , sprite  Answer theses question about your everyday activities: Can you perform most household chores?some Are you deaf or have significant trouble hearing?n Do you feel that you have a problem with memory?n Do you feel safe at home?y Last dentist visit? Hasn't been in awhile 8. Do you have any difficulty performing your everyday activities?some Are you having any difficulty walking, taking medications on your own, and or difficulty managing daily home needs?n Do you have difficulty walking or climbing stairs?y Do you have  difficulty dressing or bathing?n Do you have difficulty doing errands alone such as visiting a doctor's office or shopping?n Do you currently have any difficulty preparing food and eating?y, wife does this Do you currently have any difficulty using the toilet?n Do you have any difficulty managing your finances?wife does Do you have any difficulties with housekeeping of managing your housekeeping? Wife does   Do you have Advanced Directives in place (Living Will, Healthcare Power or [place])? y   Last eye Exam and location?[facility]   Do you currently use prescribed or non-prescribed narcotic or opioid pain medications?n  Do you have a history or close family history of breast, ovarian, tubal or peritoneal cancer or a family member with BRCA (breast cancer susceptibility 1 and 2) gene mutations? See pmh/fh    ----------------------------------------------------------------------------------------------------------------------------------------------------------------------------------------------------------------------  Because this visit was a virtual/telehealth visit, some criteria may be missing or patient reported. Any vitals not documented were not able to be obtained and vitals that have been documented are patient reported.    MEDICARE ANNUAL PREVENTIVE CARE VISIT WITH PROVIDER (Welcome to Medicare, initial annual wellness or annual wellness exam)  Virtual Visit via Video Note  I connected with HUSAYN REIM on 02/15/24  by a video enabled telemedicine application and verified that I am speaking with the correct person using two identifiers.  Location patient: home Location provider:work or home office Persons participating in the virtual visit: patient, provider, patient's wife  Concerns and/or follow up today: under treatment with chemo for bladder cancer. Reports the chemo has caused fatigue and appetite suppression. Seeing onc on a regular basis. Saw pcp for fatigue,  and poor appetite and is on low dose remeron for this. He also is seeing nutritionist at the cancer center who advised boost.    See HM section in Epic for other details of completed HM.    ROS: negative for report of fevers,  vision changes, vision loss, hearing loss or change, chest pain, sob, hemoptysis, melena, hematochezia, falls, bleeding   Patient-completed extensive health risk assessment - reviewed and discussed with the patient: See Health Risk Assessment completed with patient prior to the visit either above or in recent phone note. This was reviewed in detailed with the patient today and appropriate recommendations, orders and referrals were placed as needed per Summary below and patient instructions.   Review of Medical History: -PMH, PSH, Family History and current specialty and care providers reviewed and updated and listed below   Patient Care Team: Johnny Garnette LABOR, MD as PCP - General Shlomo Wilbert SAUNDERS, MD as PCP - Sleep Medicine (Cardiology) Rolan Ezra RAMAN, MD as PCP - Advanced Heart Failure (Cardiology) Kelsie Agent, MD (Inactive) as Consulting Physician (Cardiology) Liane Sharyne MATSU, [facility] (Inactive) as Pharmacist (Pharmacist)   Past Medical History:  Diagnosis Date   Anxiety    takes Valium  as needed   Aortic stenosis, moderate    Arthritis    Ascending aortic aneurysm    CAD (coronary artery disease)    a. s/p PCI of the RCA 8/12 with DES by Dr Wonda, preserved EF. b. LHC/RHC (2/16) with mean RA 12, PA 32/15, mean PCWP 18, CI 3.47; patent mid and distal RCA stents, 50-60% proximal stenosis small PDA.      Cancer [facility])    bladder   Carotid stenosis    a. Carotid US  (05/2013):  Bilateral 1-39% ICA; L thyroid  nodule (prior hx of aspiration).   Chronic diastolic CHF (congestive heart failure) (HCC)    Complication of anesthesia    difficulty waking up after gallbladder surgery   Depression    Dyspnea    Essential hypertension    GERD (gastroesophageal reflux  disease)    if needed will take OTC meds    Heart murmur    History of colonic polyps    hyperplastic   Hyperlipidemia    Joint pain    Lesion of bladder    Myocardial infarction (HCC) 2012   Obesity (BMI 30-39.9) 02/29/2016   Pre-diabetes    Restless leg    Sleep apnea    uses cpap   Tubular adenoma of colon    Vertigo    takes Meclizine  as needed    Past Surgical History:  Procedure Laterality Date   AORTIC VALVE REPLACEMENT N/A 09/16/2021   Procedure: AORTIC VALVE REPLACEMENT (AVR);  Surgeon: Lucas Dorise POUR, MD;  Location: [facility] OR;  Service: Open Heart Surgery;  Laterality: N/A;   CATARACT EXTRACTION   4 YRS AGO   BOTH EYES   CHOLECYSTECTOMY  07/21/2011   Procedure: LAPAROSCOPIC CHOLECYSTECTOMY WITH INTRAOPERATIVE CHOLANGIOGRAM;  Surgeon: Alm VEAR Angle, MD;  [facility]: [facility];  Service: General;  Laterality: N/A;   CORONARY ANGIOPLASTY  2012   2 stents   coronary stenting     s/p PCI of the RCA by Dr Wonda 8/12 with 2 promus stents   CYSTOSCOPY W/ RETROGRADES Bilateral 12/13/2019   Procedure: CYSTOSCOPY WITH RETROGRADE PYELOGRAM;  Surgeon: Alvaro Hummer, MD;  Location: [facility];  Service: Urology;  Laterality: Bilateral;   CYSTOSCOPY W/ RETROGRADES Bilateral 10/14/2020   Procedure: CYSTOSCOPY WITH RETROGRADE PYELOGRAM;  Surgeon:  Alvaro Hummer, MD;  Location: [facility];  Service: Urology;  Laterality: Bilateral;   CYSTOSCOPY W/ RETROGRADES Bilateral 12/16/2020   Procedure: CYSTOSCOPY WITH RETROGRADE PYELOGRAM;  Surgeon: Alvaro Hummer, MD;  Location: [facility];  Service: Urology;  Laterality: Bilateral;   CYSTOSCOPY W/ RETROGRADES Bilateral 06/03/2022   Procedure: CYSTOSCOPY WITH RETROGRADE PYELOGRAM;  Surgeon: Alvaro Hummer, MD;  [facility];  Service: Urology;  Laterality: Bilateral;   CYSTOSCOPY W/ RETROGRADES Bilateral 12/28/2022   Procedure: CYSTOSCOPY WITH RETROGRADE PYELOGRAM, FULGARATION OF BLEEDERS;  Surgeon:  Alvaro Hummer KATHEE Mickey., MD;  Location[facility];  Service: Urology;  Laterality: Bilateral;   CYSTOSCOPY WITH INJECTION N/A 03/03/2023   Procedure: CYSTOSCOPY WITH INDOCYANINE INJECTION;  Surgeon: Alvaro Hummer KATHEE Mickey., MD;  [facility];  Service: Urology;  Laterality: N/A;  360 MINUTES   ESOPHAGOGASTRODUODENOSCOPY N/A 06/28/2023   Procedure: EGD (ESOPHAGOGASTRODUODENOSCOPY);  Surgeon: Charlanne Groom, MD;  Location: [facility];  Service: Gastroenterology;  Laterality: N/A;   IR IMAGING GUIDED PORT INSERTION  05/29/2023   IR NEPHROSTOMY EXCHANGE LEFT  08/07/2023   IR NEPHROSTOMY EXCHANGE LEFT  10/02/2023   IR NEPHROSTOMY EXCHANGE LEFT  11/27/2023   IR NEPHROSTOMY EXCHANGE LEFT  01/10/2024   IR NEPHROSTOMY PLACEMENT RIGHT  07/01/2023   IR RADIOLOGY PERIPHERAL GUIDED IV START  07/01/2023   IR US  GUIDE VASC ACCESS LEFT  07/01/2023   LEFT AND RIGHT HEART CATHETERIZATION WITH CORONARY ANGIOGRAM N/A 06/23/2014   Procedure: LEFT AND RIGHT HEART CATHETERIZATION WITH CORONARY ANGIOGRAM;  Surgeon: Ezra GORMAN Shuck, MD;  Location: [facility] CATH LAB;  Service: Cardiovascular;  Laterality: N/A;   NECK SURGERY  03/23/09   per Dr. Barbarann, cervical fusion    PERICARDIOCENTESIS N/A 09/28/2021   Procedure: PERICARDIOCENTESIS;  Surgeon: Dann Candyce GORMAN, MD;  Location: [facility] INVASIVE CV LAB;  Service: Cardiovascular;  Laterality: N/A;   REPLACEMENT ASCENDING AORTA N/A 09/16/2021   Procedure: REPLACEMENT ASCENDING AORTA WITH 30 X HEMASHIELD PLATINUM WOVEN DOUBLE VELOUR VASCULAR GRAFT;  Surgeon: Lucas Dorise POUR, MD;  Location: [facility] OR;  Service: Open Heart Surgery;  Laterality: N/A;  CIRC ARREST   right elbow surgery     RIGHT HEART CATH N/A 06/15/2023   Procedure: RIGHT HEART CATH;  Surgeon: Shuck Ezra GORMAN, MD;  Location: [facility] INVASIVE CV LAB;  Service: Cardiovascular;  Laterality: N/A;   RIGHT HEART CATH AND CORONARY ANGIOGRAPHY N/A 07/08/2021   Procedure: RIGHT HEART CATH AND CORONARY ANGIOGRAPHY;  Surgeon: Shuck Ezra GORMAN, MD;   Location: [facility] INVASIVE CV LAB;  Service: Cardiovascular;  Laterality: N/A;   ROBOT ASSISTED LAPAROSCOPIC COMPLETE CYSTECT ILEAL CONDUIT N/A 03/03/2023   Procedure: XI ROBOTIC ASSISTED LAPAROSCOPIC COMPLETE CYSTECTECTOMY WITH  ILEAL CONDUIT DIVERSION;  Surgeon: Alvaro Hummer KATHEE Mickey., MD;  [facility];  Service: Urology;  Laterality: N/A;   ROBOT ASSISTED LAPAROSCOPIC RADICAL PROSTATECTOMY N/A 03/03/2023   Procedure: XI ROBOTIC ASSISTED LAPAROSCOPIC RADICAL PROSTATECTOMY WITH LYMPH NODE DISSECTION;  Surgeon: Alvaro Hummer KATHEE Mickey., MD;  [facility];  Service: Urology;  Laterality: N/A;   solonscopy  05/23/08   per Dr. Jakie inch hemorrhoids only, repeat in 5 years   SUBXYPHOID PERICARDIAL WINDOW N/A 09/28/2021   Procedure: SUBXYPHOID PERICARDIAL WINDOW;  Surgeon: Lucas Dorise POUR, MD;  Location[facility] OR;  Service: Thoracic;  Laterality: N/A;   TEE WITHOUT CARDIOVERSION N/A 06/23/2014   Procedure: TRANSESOPHAGEAL ECHOCARDIOGRAM (TEE);  Surgeon: Ezra GORMAN Shuck, MD;  Location: [facility] ENDOSCOPY;  Service: Cardiovascular;  Laterality: N/A;   TEE WITHOUT CARDIOVERSION  N/A 01/21/2016   Procedure: TRANSESOPHAGEAL ECHOCARDIOGRAM (TEE);  Surgeon: Ezra GORMAN Shuck, MD;  Location: [facility] ENDOSCOPY;  Service: Cardiovascular;  Laterality: N/A;   TEE WITHOUT CARDIOVERSION N/A 09/16/2021   Procedure: TRANSESOPHAGEAL ECHOCARDIOGRAM (TEE);  Surgeon: Lucas Dorise POUR, MD;  Location: [facility] OR;  Service: Open Heart Surgery;  Laterality: N/A;   TEE WITHOUT CARDIOVERSION N/A 09/28/2021   Procedure: TRANSESOPHAGEAL ECHOCARDIOGRAM (TEE);  Surgeon: Lucas Dorise POUR, MD;  Location: [facility] OR;  Service: Thoracic;  Laterality: N/A;   TONSILLECTOMY     TRANSESOPHAGEAL ECHOCARDIOGRAM (CATH LAB) N/A 04/17/2023   Procedure: TRANSESOPHAGEAL ECHOCARDIOGRAM;  Surgeon: Jeffrie Oneil BROCKS, MD;  Location: [facility] INVASIVE CV LAB;  Service: Cardiovascular;  Laterality: N/A;   TRANSURETHRAL RESECTION OF BLADDER TUMOR N/A 10/21/2019   Procedure: TRANSURETHRAL  RESECTION OF BLADDER TUMOR (TURBT);  Surgeon: Ottelin, Mark, MD;  Location: [facility];  Service: Urology;  Laterality: N/A;   TRANSURETHRAL RESECTION OF BLADDER TUMOR N/A 12/13/2019   Procedure: TRANSURETHRAL RESECTION OF BLADDER TUMOR (TURBT);  Surgeon: Alvaro Hummer, MD;  Location: [facility];  Service: Urology;  Laterality: N/A;  1 HR   TRANSURETHRAL RESECTION OF BLADDER TUMOR N/A 10/14/2020   Procedure: TRANSURETHRAL RESECTION OF BLADDER TUMOR (TURBT);  Surgeon: Alvaro Hummer, MD;  Location: [facility];  Service: Urology;  Laterality: N/A;   TRANSURETHRAL RESECTION OF BLADDER TUMOR N/A 12/16/2020   Procedure: RESTAGING TRANSURETHRAL RESECTION OF BLADDER TUMOR (TURBT);  Surgeon: Alvaro Hummer, MD;  Location: [facility];  Service: Urology;  Laterality: N/A;   TRANSURETHRAL RESECTION OF BLADDER TUMOR N/A 06/03/2022   Procedure: TRANSURETHRAL RESECTION OF BLADDER TUMOR (TURBT);  Surgeon: Alvaro Hummer, MD;  [facility];  Service: Urology;  Laterality: N/A;   UMBILICAL HERNIA REPAIR  03/03/2023   Procedure: HERNIA REPAIR UMBILICAL;  Surgeon: Alvaro Hummer KATHEE Mickey., MD;  [facility];  Service: Urology;;    Social History   Socioeconomic History   Marital status: Married    Spouse name: Not on file   Number of children: 4   Years of education: Not on file   Highest education level: Not on file  Occupational History   Occupation: Manufacturing engineer    Employer: [employer]  Tobacco Use   Smoking status: Former    Current packs/day: 0.00    Average packs/day: 2.0 packs/day for 40.0 years (80.0 ttl pk-yrs)    Types: Cigarettes    Start date: 109    Quit date: 2012    Years since quitting: 13.8    Passive exposure: Never   Smokeless tobacco: Never  Vaping Use   Vaping status: Never Used  Substance and Sexual Activity   Alcohol use: No    Alcohol/week: 0.0 standard drinks of alcohol   Drug use: No    Sexual activity: Not on file  Other Topics Concern   Not on file  Social History Narrative   Not on file   Social Drivers of Health   Financial Resource Strain: Low Risk  (02/15/2024)   Overall Financial Resource Strain (CARDIA)    Difficulty of Paying Living Expenses: Not hard at all  Food Insecurity: No Food Insecurity (02/15/2024)   Hunger Vital Sign    Worried About Running Out of Food in the Last Year: Never true    Ran Out of Food in the Last Year: Never true  Transportation Needs: No Transportation Needs (02/15/2024)   PRAPARE - Administrator, Civil Service (Medical): No  Lack of Transportation (Non-Medical): No  Physical Activity: Inactive (02/15/2024)   Exercise Vital Sign    Days of Exercise per Week: 0 days    Minutes of Exercise per Session: Not on file  Stress: Stress Concern Present (02/15/2024)   [employer] of Occupational Health - Occupational Stress Questionnaire    Feeling of Stress: To some extent  Social Connections: Unknown (02/15/2024)   Social Connection and Isolation Panel    Frequency of Communication with Friends and Family: Once a week    Frequency of Social Gatherings with Friends and Family: More than three times a week    Attends Religious Services: Patient declined    Database administrator or Organizations: Patient declined    Attends Banker Meetings: Not on file    Marital Status: Married  Intimate Partner Violence: Not At Risk (07/04/2023)   Humiliation, Afraid, Rape, and Kick questionnaire    Fear of Current or Ex-Partner: No    Emotionally Abused: No    Physically Abused: No    Sexually Abused: No    Family History  Problem Relation Age of Onset   Lung cancer Mother        lung   Esophageal cancer Cousin    Colon cancer Neg Hx    Rectal cancer Neg Hx    Stomach cancer Neg Hx     Current Outpatient Medications on File Prior to Visit  Medication Sig Dispense Refill   acetaminophen  (TYLENOL )  325 MG tablet Take 650 mg by mouth every 6 (six) hours as needed (for pain).     amLODipine  (NORVASC ) 5 MG tablet TAKE 1 TABLET (5 MG TOTAL) BY MOUTH DAILY. 90 tablet 1   aspirin  EC 81 MG tablet Take 81 mg by mouth in the morning.     carvedilol  (COREG ) 3.125 MG tablet TAKE 1 TABLET BY MOUTH TWICE A DAY WITH FOOD 180 tablet 1   cyanocobalamin  (VITAMIN B12) 1000 MCG tablet Take 1 tablet (1,000 mcg total) by mouth daily. 30 tablet 6   diazepam  (VALIUM ) 5 MG tablet TAKE 1 TABLET BY MOUTH EVERY 12 HOURS AS NEEDED FOR ANXIETY. 60 tablet 5   famotidine  (PEPCID ) 20 MG tablet Take 1 tablet (20 mg total) by mouth 2 (two) times daily for 6 days. (Patient taking differently: Take 20 mg by mouth 2 (two) times daily. As needed) 12 tablet 0   ferrous sulfate  325 (65 FE) MG tablet Take 1 tablet (325 mg total) by mouth daily with breakfast. Please take with a source of Vitamin C 90 tablet 3   furosemide  (LASIX ) 20 MG tablet TAKE 3 TABLETS BY MOUTH EVERY MORNING AND TAKE 2 TABLETS BY MOUTH EVERY EVENING 150 tablet 3   ipratropium (ATROVENT ) 0.03 % nasal spray Place 2 sprays into both nostrils every 12 (twelve) hours as needed for rhinitis.     lidocaine -prilocaine  (EMLA ) cream Apply topically once a week. 30 g 0   Magnesium  500 MG TABS Take 500 mg by mouth every morning.     meclizine  (ANTIVERT ) 25 MG tablet Take 1 tablet (25 mg total) by mouth 3 (three) times daily as needed for dizziness. 30 tablet 0   mirtazapine (REMERON) 7.5 MG tablet Take 1 tablet (7.5 mg total) by mouth at bedtime. 30 tablet 1   multivitamin-iron-minerals-folic acid  (CENTRUM) chewable tablet Chew 1 tablet by mouth daily.     nitroGLYCERIN  (NITROSTAT ) 0.4 MG SL tablet Place 0.4 mg under the tongue every 5 (five) minutes as needed for  chest pain.     ondansetron  (ZOFRAN ) 8 MG tablet Take 1 tablet (8 mg total) by mouth every 8 (eight) hours as needed. 30 tablet 0   pantoprazole  (PROTONIX ) 40 MG tablet TAKE 1 TABLET (40 MG TOTAL) BY MOUTH TWICE A  DAY BEFORE MEALS 180 tablet 1   potassium chloride  SA (KLOR-CON  M20) 20 MEQ tablet Take 1 tablet (20 mEq total) by mouth daily. (Patient taking differently: Take 20 mEq by mouth 2 (two) times daily.)     prochlorperazine  (COMPAZINE ) 10 MG tablet Take 1 tablet (10 mg total) by mouth every 6 (six) hours as needed for nausea or vomiting. 30 tablet 0   rosuvastatin  (CRESTOR ) 20 MG tablet Take 1 tablet (20 mg total) by mouth daily. 90 tablet 3   temazepam  (RESTORIL ) 30 MG capsule Take 1 capsule (30 mg total) by mouth at bedtime as needed for sleep. 90 capsule 1   traMADol  (ULTRAM ) 50 MG tablet Take 1 tablet (50 mg total) by mouth every 6 (six) hours as needed. 30 tablet 0   venlafaxine  XR (EFFEXOR -XR) 150 MG 24 hr capsule Take 1 capsule (150 mg total) by mouth daily with breakfast. 90 capsule 3   No current facility-administered medications on file prior to visit.    No Known Allergies     Physical Exam Vitals requested from patient and listed below if patient had equipment and was able to obtain at home for this virtual visit: There were no vitals filed for this visit. Estimated body mass index is 36.53 kg/m as calculated from the following:   Height as of 01/25/24: 5' 6 (1.676 m).   Weight as of 02/08/24: 226 lb 4.8 oz (102.6 kg).  EKG (optional): deferred due to virtual visit  GENERAL: alert, oriented, no acute distress detected; full vision exam deferred due to pandemic and/or virtual encounter  HEENT: atraumatic, conjunttiva clear, no obvious abnormalities on inspection of external nose and ears  NECK: normal movements of the head and neck  LUNGS: on inspection no signs of respiratory distress, breathing rate appears normal, no obvious gross SOB, gasping or wheezing  CV: no obvious cyanosis  MS: moves all visible extremities without noticeable abnormality  PSYCH/NEURO: pleasant and cooperative, no obvious depression or anxiety, speech and thought processing grossly intact,  Cognitive function grossly intact  Flowsheet Row Office Visit from 02/08/2024 in [facility] A Dept Of [place]. [facility]  PHQ-9 Total Score 4        02/15/2024    4:29 PM 02/08/2024    1:56 PM 01/25/2024   11:45 AM 01/04/2024    1:42 PM 12/14/2023   10:00 AM  Depression screen PHQ 2/9  Decreased Interest 1 1 0 0 0  Down, Depressed, Hopeless 1 1 0 0 1  PHQ - 2 Score 2 2 0 0 1  Altered sleeping  0     Tired, decreased energy  1     Change in appetite  1     Feeling bad or failure about yourself   0     Trouble concentrating  0     Moving slowly or fidgety/restless  0     Suicidal thoughts  0     PHQ-9 Score  4          05/23/2022    1:59 PM 08/08/2022    2:42 PM 11/03/2022    3:59 PM 02/15/2024   11:26 AM 02/15/2024    4:30 PM  Fall Risk  Falls in the past year? 0 0 0 0 0  Was there an injury with Fall? 0 0 0 0 0  Fall Risk Category Calculator 0 0 0 0  0  Patient at Risk for Falls Due to No Fall Risks No Fall Risks No Fall Risks    Fall risk Follow up Falls evaluation completed Falls evaluation completed Falls evaluation completed       Patient-reported     SUMMARY AND PLAN:  Encounter for Medicare annual wellness exam   Discussed applicable health maintenance/preventive health measures and advised and referred or ordered per patient preferences: -declines shingles vaccine -he is considering getting covid vaccine, reports already had flu vaccine  Health Maintenance  Topic Date Due   Hepatitis C Screening  Never done   Zoster Vaccines- Shingrix (1 of 2) Never done   COVID-19 Vaccine (4 - 2025-26 season) 12/25/2023   Influenza Vaccine  01/23/2025 (Originally 11/24/2023)   Lung Cancer Screening  01/21/2025   Diabetic kidney evaluation - Urine ACR  01/24/2025   Diabetic kidney evaluation - eGFR measurement  02/07/2025   Medicare Annual Wellness (AWV)  02/14/2025   Colonoscopy  10/03/2025   DTaP/Tdap/Td (2 - Td or Tdap) 11/24/2033    Pneumococcal Vaccine: 50+ Years  Completed   Meningococcal B Vaccine  Aged [employer] and counseling on the following was provided based on the above review of health and a plan/checklist for the patient, along with additional information discussed, was provided for the patient in the patient instructions :  -Advised and counseled on a healthy lifestyle - with emphasis on things that may improve QOL while undergoing chemo for cancer. He agrees to ask at the cancer center for counseling to help with Anxiety and sleep - he feels he has fatigue more than depression and reports med for depression is working.  -Reviewed patient's current diet. Advised and counseled on a whole foods based healthy diet.encouraged to try eating small amounts of healthy nutritious foods throughout the day.  A summary of a healthy diet was provided in the Patient Instructions.  -encouraged to try to start exercising even if just chair exercises and walking in place while watching TV for several minutes at time -Advise regular dental visits as able.  -sleep info provided   Follow up: see patient instructions   Patient Instructions  I really enjoyed getting to talk with you today! I am available on Tuesdays and Thursdays for virtual visits if you have any questions or concerns, or if I can be of any further assistance.   CHECKLIST FROM ANNUAL WELLNESS VISIT:  -Follow up (please call to schedule if not scheduled after visit):   -ask to schedule counseling through the cancer center for sleep and anxiety, or can call number below for Powers   -follow up with Dr. Johnny if needed for further adjustment in medications   -yearly for annual wellness visit with primary care office  Here is a list of your preventive care/health maintenance measures and the plan for each if any are due:  PLAN For any measures below that may be due:   Health Maintenance  Topic Date Due   Hepatitis C Screening  Never done   Zoster  Vaccines- Shingrix (1 of 2) Never done   Medicare Annual Wellness (AWV)  11/03/2023   COVID-19 Vaccine (4 - 2025-26 season) 12/25/2023   Influenza Vaccine  01/23/2025 (Originally 11/24/2023)   Lung Cancer Screening  01/21/2025  Diabetic kidney evaluation - Urine ACR  01/24/2025   Diabetic kidney evaluation - eGFR measurement  02/07/2025   Colonoscopy  10/03/2025   DTaP/Tdap/Td (2 - Td or Tdap) 11/24/2033   Pneumococcal Vaccine: 50+ Years  Completed   Meningococcal B Vaccine  Aged Out    -See a dentist at least yearly  -Get your eyes checked and then per your eye specialist's recommendations  -Other issues addressed today:   1. Small regular meals even if just a few bites of protein and veggies, fruits at a time - this is better than nothing. Soups and smoothies are easier for some.    2. Try to move on a regular basis, a few minutes here and a few minutes there - chair exercises with arms and legs and walking in place while watching TV.    3. Sleep information below.   -I have included below further information regarding a healthy whole foods based diet, physical activity guidelines for adults, stress management and opportunities for social connections. I hope you find this information useful.   -----------------------------------------------------------------------------------------------------------------------------------------------------------------------------------------------------------------------------------------------------------    NUTRITION: -eat real food: lots of colorful vegetables (half the plate) and fruits -5-7 servings of vegetables and fruits per day (fresh or steamed is best), exp. 2 servings of vegetables with lunch and dinner and 2 servings of fruit per day. Berries and greens such as kale and collards are great choices.  -consume on a regular basis:  fresh fruits, fresh veggies, fish, nuts, seeds, healthy oils (such as olive oil, avocado oil), whole grains  (make sure for bread/pasta/crackers/etc., that the first ingredient on label contains the word whole), legumes. -can eat small amounts of dairy and lean meat (no larger than the palm of your hand), but avoid processed meats such as ham, bacon, lunch meat, etc. -drink water  -try to avoid fast food and pre-packaged foods, processed meat, ultra processed foods/beverages (donuts, candy, etc.) -most experts advise limiting sodium to < 2300mg  per day, should limit further is any chronic conditions such as high blood pressure, heart disease, diabetes, etc. The American Heart Association advised that < 1500mg  is is ideal -try to avoid foods/beverages that contain any ingredients with names you do not recognize  -try to avoid foods/beverages  with added sugar or sweeteners/sweets  -try to avoid sweet drinks (including diet drinks): soda, juice, Gatorade, sweet tea, power drinks, diet drinks -try to avoid white rice, white bread, pasta (unless whole grain)  EXERCISE GUIDELINES FOR ADULTS: -if you wish to increase your physical activity, do so gradually and with the approval of your doctor -STOP and seek medical care immediately if you have any chest pain, chest discomfort or trouble breathing when starting or increasing exercise  -move and stretch your body, legs, feet and arms when sitting for long periods -Physical activity guidelines for optimal health in adults: -get at least 150 minutes per week of moderate exercise (can talk, but not sing); this is about 20-30 minutes of sustained activity 5-7 days per week or two 10-15 minute episodes of sustained activity 5-7 days per week -do some muscle building/resistance training/strength training at least 2 days per week  -balance exercises 3+ days per week:   Stand somewhere where you have something sturdy to hold onto if you lose balance    1) lift up on toes, then back down, start with 5x per day and work up to 20x   2) stand and lift one leg straight out  to the side so that foot is a  few inches of the floor, start with 5x each side and work up to 20x each side   3) stand on one foot, start with 5 seconds each side and work up to 20 seconds on each side  If you need ideas or help with getting more active:  -Silver sneakers https://tools.silversneakers.com  -Walk with a Doc: http://www.duncan-williams.com/  -try to include resistance (weight lifting/strength building) and balance exercises twice per week: or the following link for ideas: http://castillo-powell.com/  BuyDucts.dk  STRESS MANAGEMENT: -can try meditating, or just sitting quietly with deep breathing while intentionally relaxing all parts of your body for 5 minutes daily -if you need further help with stress, anxiety or depression please follow up with your primary doctor or contact the wonderful folks at [facility]: [phone]  SOCIAL CONNECTIONS: -options in [place] if you wish to engage in more social and exercise related activities:  -Silver sneakers https://tools.silversneakers.com  -Walk with a Doc: http://www.duncan-williams.com/  -Check out the [facility] Active Adults 50+ section on the [place] [employer] (hiking clubs, book clubs, cards and games, chess, exercise classes, aquatic classes and much more) - see the website for details: https://www.[place].gov/departments/parks-recreation/active-adults50  -YouTube has lots of exercise videos for different ages and abilities as well  -[facility] (a variety of indoor and outdoor inperson activities for adults). [phone]. [address]  -Virtual Online Classes (a variety of topics): see seniorplanet.org or call [phone]  -consider volunteering at a school, hospice center, church, senior center or elsewhere     FOR IMPROVED SLEEP AND TO RESET YOUR SLEEP SCHEDULE:  []   Schedule sleep counseling(cognitive behavioral therapy).   [facility] is a good option.   Call for appointment: [phone]  []  Exercise 30 minutes daily. Some people do better with exercise in the morning, other do better exercising later in the day.  []  Avoid caffeine and alcohol - particularly in the evenings. For some people even a little alcohol can impair sleep.   []  Go to bed and wake up at the same time everyday (within a 30 minute window). When you get up in the morning for your designated wake time - turn on lights and open curtains right away. This will help to set your internal sleep clock.   []  Keep bedroom cool, dark and quiet - if you have to get up at night make sure there is no white light from street lamps, night lights, lights, clock, devices etc. that enters your eyes. Instead, use red light flashlight or night lights and avoid looking directly at the light while up.   []  Set 1-2 hour bedtime routine, dim lights, avoid screens (phones, computers, TVs, etc during this time), consider sleepytime tea, warm bath or shower, avoid alcohol and caffeine  []  Reserve bed for sleep - do not read, watch TV, look at phone or device, etc., in bed.  []  If you toss and turn for more then 10-15 minutes, get out of bed and list or journal thoughts or do quiet activity (not screen-time, no tv, phone, computer) then go back to bed. Repeat as needed. Try not to worry about when you will eventually fall asleep.  []  Some people find that a half dose of benadryl , melatonin, tylenol  pm or unisom on a few nights per week is helpful initially for a few weeks.  []  Seek help for any depression or anxiety.  [] Prescription strength sleep medications should only be used in severe cases of insomnia if other measures fail and  should be used sparingly.  I hope you are feeling better soon! Follow up with your doctor in 3-4 weeks or sooner if your symptoms worsen or new concerns  arise.        Chiquita JONELLE Cramp, DO

## 2024-02-16 ENCOUNTER — Other Ambulatory Visit: Payer: Self-pay | Admitting: Nurse Practitioner

## 2024-02-18 ENCOUNTER — Encounter: Payer: Self-pay | Admitting: Hematology and Oncology

## 2024-02-20 DIAGNOSIS — Z8551 Personal history of malignant neoplasm of bladder: Secondary | ICD-10-CM | POA: Diagnosis not present

## 2024-02-21 ENCOUNTER — Other Ambulatory Visit (HOSPITAL_COMMUNITY): Payer: Self-pay | Admitting: Radiology

## 2024-02-21 ENCOUNTER — Ambulatory Visit (HOSPITAL_COMMUNITY)
Admission: RE | Admit: 2024-02-21 | Discharge: 2024-02-21 | Disposition: A | Discharge status: Home / Self Care | Source: Ambulatory Visit

## 2024-02-21 DIAGNOSIS — Z436 Encounter for attention to other artificial openings of urinary tract: Secondary | ICD-10-CM | POA: Insufficient documentation

## 2024-02-21 DIAGNOSIS — C679 Malignant neoplasm of bladder, unspecified: Secondary | ICD-10-CM | POA: Diagnosis not present

## 2024-02-21 DIAGNOSIS — Z936 Other artificial openings of urinary tract status: Secondary | ICD-10-CM

## 2024-02-21 HISTORY — PX: IR NEPHROSTOMY EXCHANGE LEFT: IMG6069

## 2024-02-21 MED ORDER — LIDOCAINE HCL 1 % IJ SOLN
20.0000 mL | Freq: Once | INTRAMUSCULAR | Status: AC
  Administered 2024-02-21: 8 mL via INTRADERMAL

## 2024-02-21 MED ORDER — LIDOCAINE HCL 1 % IJ SOLN
INTRAMUSCULAR | Status: AC
  Filled 2024-02-21: qty 20

## 2024-02-21 MED ORDER — IOHEXOL 300 MG/ML  SOLN
50.0000 mL | Freq: Once | INTRAMUSCULAR | Status: AC | PRN
  Administered 2024-02-21: 25 mL

## 2024-02-21 NOTE — Procedures (Signed)
 Interventional Radiology Procedure:   Indications: Bilateral nephrostomy tubes, routine exchange  Procedure: Exchange of bilateral nephrostomy tubes  Findings: New bilateral 10 Fr nephrostomy tubes placed.   Complications: None     EBL: Minimal  Plan:  Continue with routine exchanges.    Trestan Vahle R. Philip, MD  Pager: [phone]

## 2024-02-22 ENCOUNTER — Inpatient Hospital Stay

## 2024-02-22 ENCOUNTER — Inpatient Hospital Stay: Admitting: Hematology and Oncology

## 2024-02-22 ENCOUNTER — Inpatient Hospital Stay: Admitting: Dietician

## 2024-02-22 VITALS — BP 138/74 | HR 78 | Temp 98.3°F | Resp 14 | Wt 227.4 lb

## 2024-02-22 DIAGNOSIS — C679 Malignant neoplasm of bladder, unspecified: Secondary | ICD-10-CM

## 2024-02-22 DIAGNOSIS — Z5112 Encounter for antineoplastic immunotherapy: Secondary | ICD-10-CM

## 2024-02-22 DIAGNOSIS — Z95828 Presence of other vascular implants and grafts: Secondary | ICD-10-CM

## 2024-02-22 LAB — CMP (CANCER CENTER ONLY)
ALT: 16 U/L (ref 0–44)
AST: 22 U/L (ref 15–41)
Albumin: 3.2 g/dL — ABNORMAL LOW (ref 3.5–5.0)
Alkaline Phosphatase: 122 U/L (ref 38–126)
Anion gap: 7 (ref 5–15)
BUN: 21 mg/dL (ref 8–23)
CO2: 24 mmol/L (ref 22–32)
Calcium: 8.1 mg/dL — ABNORMAL LOW (ref 8.9–10.3)
Chloride: 104 mmol/L (ref 98–111)
Creatinine: 2.03 mg/dL — ABNORMAL HIGH (ref 0.61–1.24)
GFR, Estimated: 35 mL/min — ABNORMAL LOW (ref >60)
Glucose, Bld: 204 mg/dL — ABNORMAL HIGH (ref 70–99)
Potassium: 4.2 mmol/L (ref 3.5–5.1)
Sodium: 135 mmol/L (ref 135–145)
Total Bilirubin: 0.9 mg/dL (ref 0.0–1.2)
Total Protein: 6.9 g/dL (ref 6.5–8.1)

## 2024-02-22 LAB — CBC WITH DIFFERENTIAL (CANCER CENTER ONLY)
Abs Immature Granulocytes: 0.03 K/uL (ref 0.00–0.07)
Basophils Absolute: 0 K/uL (ref 0.0–0.1)
Basophils Relative: 1 %
Eosinophils Absolute: 0 K/uL (ref 0.0–0.5)
Eosinophils Relative: 0 %
HCT: 26.9 % — ABNORMAL LOW (ref 39.0–52.0)
Hemoglobin: 8.5 g/dL — ABNORMAL LOW (ref 13.0–17.0)
Immature Granulocytes: 1 %
Lymphocytes Relative: 6 %
Lymphs Abs: 0.4 K/uL — ABNORMAL LOW (ref 0.7–4.0)
MCH: 29.6 pg (ref 26.0–34.0)
MCHC: 31.6 g/dL (ref 30.0–36.0)
MCV: 93.7 fL (ref 80.0–100.0)
Monocytes Absolute: 0.8 K/uL (ref 0.1–1.0)
Monocytes Relative: 13 %
Neutro Abs: 5.2 K/uL (ref 1.7–7.7)
Neutrophils Relative %: 79 %
Platelet Count: 147 K/uL — ABNORMAL LOW (ref 150–400)
RBC: 2.87 MIL/uL — ABNORMAL LOW (ref 4.22–5.81)
RDW: 20.1 % — ABNORMAL HIGH (ref 11.5–15.5)
WBC Count: 6.5 K/uL (ref 4.0–10.5)
nRBC: 0 % (ref 0.0–0.2)

## 2024-02-22 MED ORDER — PROCHLORPERAZINE MALEATE 10 MG PO TABS
10.0000 mg | ORAL_TABLET | Freq: Once | ORAL | Status: AC
  Administered 2024-02-22: 10 mg via ORAL
  Filled 2024-02-22: qty 1

## 2024-02-22 MED ORDER — SODIUM CHLORIDE 0.9 % IV SOLN
125.0000 mg | Freq: Once | INTRAVENOUS | Status: AC
  Administered 2024-02-22: 125 mg via INTRAVENOUS
  Filled 2024-02-22: qty 9

## 2024-02-22 MED ORDER — SODIUM CHLORIDE 0.9 % IV SOLN: INTRAVENOUS | Status: DC

## 2024-02-22 NOTE — Progress Notes (Signed)
 Updated Scr treatment parameters to >2.1 per Dr. Federico okay.  Harlene Nasuti, PharmD Oncology Infusion Pharmacist 02/22/2024 10:12 AM

## 2024-02-22 NOTE — Progress Notes (Signed)
 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 Completed adjuvant radiation 2/24-4/3. He is currently receiving Enfortumab (start 7/10).    Met with patient and wife in infusion. Patient reports feeling a little down today. Says he did not sleep well last night which is contributing to current emotional state. Per wife he has been eating better recently. Continues to have good and bad days. Says he did not eat anything last Thursday. Patient tried Ensure samples which tasted okay. He tolerated them well and is agreeable to drink one/day. Patient denies nausea.   Medications: reviewed   Labs: glucose 204, Cr 2.03, albumin  3.2  Anthropometrics: Wt 227 lb 6.4 oz today decreased 7% in 6 weeks - this is severe for time frame  9/18 - 244 lb    NUTRITION DIAGNOSIS: Unintended wt loss continues - pt agreeable to daily ONS   INTERVENTION:  Encourage small frequent meals/snacks high in calories and protein Start Ensure Complete/equivalent once daily - samples + coupons  Support and encouragement     MONITORING, EVALUATION, GOAL: wt trends, intake   NEXT VISIT: Thursday November 13 during infusion

## 2024-02-22 NOTE — Progress Notes (Signed)
 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 Federico  08/12/2022: Cycle 1 of Pembrolizumab  09/02/2022: Cycle 2 of Pembrolizumab   09/23/2022: Cycle 3 of Pembrolizumab  10/14/2022: Cycle 4 of Pembrolizumab  11/04/2022: Cycle 5 of Pembrolizumab  11/25/2022: Cycle 6 of Pembrolizumab  12/20/2022: Cycle 7 of Pembrolizumab  01/13/2023: Cycle 8 of Pembrolizumab   03/03/2023: Cystectomy/Prostatectomy showed infiltrative high-grade urothelial carcinoma, size 5.8 cm involving bladder dome, anterior wall and prostatic stroma. Tumor invades directly into prostatic stroma at apex and mid portion of the gland (pT4a). Metastatic urothelial cancer in one of two left common iliac lymph nodes.  06/19/2023-07/27/2023: Received adjuvant radiation to surgical bed.  He received 50.4 Gray in 28 fractions. 11/02/2023: Cycle 1 Day 1 of Enfortumab.  11/29/2023:  Cycle 2 Day 1 of Enfortumab.  12/28/2023: Cycle 3 Day 1 of Enfortumab.   CHIEF COMPLAINTS/PURPOSE OF CONSULTATION:  High Risk Non-Muscle Invasive Bladder Cancer   HISTORY OF PRESENTING ILLNESS:  Agent SAUNDERS Stain 68 y.o. male with medical  history significant for BCG unresponsive high risk nonmuscle invasive bladder cancer who presents for a follow up visit.  He was last seen on 02/08/2024.  He presents today to continue Enfortumab treatment.   On exam today Mr. Massar reports he continues to be a little bit up and interval fall since our last visit.  He did have a few good days since her last treatment with some improvement in appetite and increased Ensure intake.  He reports that he feels it may be too early in the morning for him and he is sleeping poorly.  He is not having any runny nose, sore throat, cough.  He denies any fevers, chills, sweats.  He is not having any nausea or vomiting but does feel like he is more constipated.  He reports that he does have some occasional blood in his nephrostomy tube and did have an exchanged yesterday.  He is doing his best to keep up with hydration.  Today we talked about the option take a break from his chemotherapy if it is too challenging for him or if he needs a breather.  He reports he would like to proceed with treatment today.  His labs are steady and overall he is willing and able to proceed with treatment today.  Full 10 point ROS otherwise negative.  MEDICAL HISTORY:  Past Medical History:  Diagnosis Date   Anxiety    takes Valium  as needed   Aortic stenosis, moderate    Arthritis    Ascending aortic aneurysm    CAD (coronary artery disease)    a. s/p PCI of the RCA 8/12 with DES by Dr Wonda, preserved EF. b. LHC/RHC (2/16) with mean RA 12, PA 32/15, mean PCWP 18, CI 3.47; patent mid and distal RCA stents, 50-60% proximal stenosis small PDA.  Cancer [facility])    bladder   Carotid stenosis    a. Carotid US  (05/2013):  Bilateral 1-39% ICA; L thyroid  nodule (prior hx of aspiration).   Chronic diastolic CHF (congestive heart failure) (HCC)    Complication of anesthesia    difficulty waking up after gallbladder surgery   Depression    Dyspnea    Essential hypertension    GERD  (gastroesophageal reflux disease)    if needed will take OTC meds    Heart murmur    History of colonic polyps    hyperplastic   Hyperlipidemia    Joint pain    Lesion of bladder    Myocardial infarction (HCC) 2012   Obesity (BMI 30-39.9) 02/29/2016   Pre-diabetes    Restless leg    Sleep apnea    uses cpap   Tubular adenoma of colon    Vertigo    takes Meclizine  as needed    SURGICAL HISTORY: Past Surgical History:  Procedure Laterality Date   AORTIC VALVE REPLACEMENT N/A 09/16/2021   Procedure: AORTIC VALVE REPLACEMENT (AVR);  Surgeon: Lucas Dorise POUR, MD;  Location: [facility] OR;  Service: Open Heart Surgery;  Laterality: N/A;   CATARACT EXTRACTION   4 YRS AGO   BOTH EYES   CHOLECYSTECTOMY  07/21/2011   Procedure: LAPAROSCOPIC CHOLECYSTECTOMY WITH INTRAOPERATIVE CHOLANGIOGRAM;  Surgeon: Alm VEAR Angle, MD;  Location: [facility] ORS;  Service: General;  Laterality: N/A;   CORONARY ANGIOPLASTY  2012   2 stents   coronary stenting     s/p PCI of the RCA by Dr Wonda 8/12 with 2 promus stents   CYSTOSCOPY W/ RETROGRADES Bilateral 12/13/2019   Procedure: CYSTOSCOPY WITH RETROGRADE PYELOGRAM;  Surgeon: Alvaro Hummer, MD;  Location: [facility];  Service: Urology;  Laterality: Bilateral;   CYSTOSCOPY W/ RETROGRADES Bilateral 10/14/2020   Procedure: CYSTOSCOPY WITH RETROGRADE PYELOGRAM;  Surgeon: Alvaro Hummer, MD;  Location: [facility];  Service: Urology;  Laterality: Bilateral;   CYSTOSCOPY W/ RETROGRADES Bilateral 12/16/2020   Procedure: CYSTOSCOPY WITH RETROGRADE PYELOGRAM;  Surgeon: Alvaro Hummer, MD;  Location: [facility];  Service: Urology;  Laterality: Bilateral;   CYSTOSCOPY W/ RETROGRADES Bilateral 06/03/2022   Procedure: CYSTOSCOPY WITH RETROGRADE PYELOGRAM;  Surgeon: Alvaro Hummer, MD;  Location: [facility];  Service: Urology;  Laterality: Bilateral;   CYSTOSCOPY W/ RETROGRADES Bilateral 12/28/2022   Procedure: CYSTOSCOPY WITH RETROGRADE  PYELOGRAM, FULGARATION OF BLEEDERS;  Surgeon: Alvaro Hummer KATHEE Mickey., MD;  Location: [facility];  Service: Urology;  Laterality: Bilateral;   CYSTOSCOPY WITH INJECTION N/A 03/03/2023   Procedure: CYSTOSCOPY WITH INDOCYANINE INJECTION;  Surgeon: Alvaro Hummer KATHEE Mickey., MD;  Location: [facility];  Service: Urology;  Laterality: N/A;  360 MINUTES   ESOPHAGOGASTRODUODENOSCOPY N/A 06/28/2023   Procedure: EGD (ESOPHAGOGASTRODUODENOSCOPY);  Surgeon: Charlanne Groom, MD;  Location: [facility];  Service: Gastroenterology;  Laterality: N/A;   IR IMAGING GUIDED PORT INSERTION  05/29/2023   IR NEPHROSTOMY EXCHANGE LEFT  08/07/2023   IR NEPHROSTOMY EXCHANGE LEFT  10/02/2023   IR NEPHROSTOMY EXCHANGE LEFT  11/27/2023   IR NEPHROSTOMY EXCHANGE LEFT  01/10/2024   IR NEPHROSTOMY EXCHANGE LEFT  02/21/2024   IR NEPHROSTOMY PLACEMENT RIGHT  07/01/2023   IR RADIOLOGY PERIPHERAL GUIDED IV START  07/01/2023   IR US  GUIDE VASC ACCESS LEFT  07/01/2023   LEFT AND RIGHT HEART CATHETERIZATION WITH CORONARY ANGIOGRAM N/A 06/23/2014   Procedure: LEFT AND RIGHT HEART CATHETERIZATION WITH CORONARY ANGIOGRAM;  Surgeon: Ezra GORMAN Shuck, MD;  Location:  [facility] CATH LAB;  Service: Cardiovascular;  Laterality: N/A;   NECK SURGERY  03/23/09   per Dr. Barbarann, cervical fusion    PERICARDIOCENTESIS N/A 09/28/2021   Procedure: PERICARDIOCENTESIS;  Surgeon: Dann Candyce RAMAN, MD;  Location: [facility] INVASIVE CV LAB;  Service: Cardiovascular;  Laterality: N/A;   REPLACEMENT ASCENDING AORTA N/A 09/16/2021   Procedure: REPLACEMENT ASCENDING AORTA WITH 30 X HEMASHIELD PLATINUM WOVEN DOUBLE VELOUR VASCULAR GRAFT;  Surgeon: Lucas Dorise POUR, MD;  Location: [facility] OR;  Service: Open Heart Surgery;  Laterality: N/A;  CIRC ARREST   right elbow surgery     RIGHT HEART CATH N/A 06/15/2023   Procedure: RIGHT HEART CATH;  Surgeon: Rolan Ezra RAMAN, MD;  Location: [facility] INVASIVE CV LAB;  Service: Cardiovascular;  Laterality: N/A;   RIGHT HEART CATH AND CORONARY ANGIOGRAPHY N/A 07/08/2021    Procedure: RIGHT HEART CATH AND CORONARY ANGIOGRAPHY;  Surgeon: Rolan Ezra RAMAN, MD;  Location: [facility] INVASIVE CV LAB;  Service: Cardiovascular;  Laterality: N/A;   ROBOT ASSISTED LAPAROSCOPIC COMPLETE CYSTECT ILEAL CONDUIT N/A 03/03/2023   Procedure: XI ROBOTIC ASSISTED LAPAROSCOPIC COMPLETE CYSTECTECTOMY WITH  ILEAL CONDUIT DIVERSION;  Surgeon: Alvaro Ricardo KATHEE Mickey., MD;  Location: [facility];  Service: Urology;  Laterality: N/A;   ROBOT ASSISTED LAPAROSCOPIC RADICAL PROSTATECTOMY N/A 03/03/2023   Procedure: XI ROBOTIC ASSISTED LAPAROSCOPIC RADICAL PROSTATECTOMY WITH LYMPH NODE DISSECTION;  Surgeon: Alvaro Ricardo KATHEE Mickey., MD;  Location: [facility];  Service: Urology;  Laterality: N/A;   solonscopy  05/23/08   per Dr. Jakie inch hemorrhoids only, repeat in 5 years   SUBXYPHOID PERICARDIAL WINDOW N/A 09/28/2021   Procedure: SUBXYPHOID PERICARDIAL WINDOW;  Surgeon: Lucas Dorise POUR, MD;  Location: [facility] OR;  Service: Thoracic;  Laterality: N/A;   TEE WITHOUT CARDIOVERSION N/A 06/23/2014   Procedure: TRANSESOPHAGEAL ECHOCARDIOGRAM (TEE);  Surgeon: Ezra RAMAN Rolan, MD;  Location: [facility] ENDOSCOPY;  Service: Cardiovascular;  Laterality: N/A;   TEE WITHOUT CARDIOVERSION N/A 01/21/2016   Procedure: TRANSESOPHAGEAL ECHOCARDIOGRAM (TEE);  Surgeon: Ezra RAMAN Rolan, MD;  Location: [facility] ENDOSCOPY;  Service: Cardiovascular;  Laterality: N/A;   TEE WITHOUT CARDIOVERSION N/A 09/16/2021   Procedure: TRANSESOPHAGEAL ECHOCARDIOGRAM (TEE);  Surgeon: Lucas Dorise POUR, MD;  Location: [facility] OR;  Service: Open Heart Surgery;  Laterality: N/A;   TEE WITHOUT CARDIOVERSION N/A 09/28/2021   Procedure: TRANSESOPHAGEAL ECHOCARDIOGRAM (TEE);  Surgeon: Lucas Dorise POUR, MD;  Location: [facility] OR;  Service: Thoracic;  Laterality: N/A;   TONSILLECTOMY     TRANSESOPHAGEAL ECHOCARDIOGRAM (CATH LAB) N/A 04/17/2023   Procedure: TRANSESOPHAGEAL ECHOCARDIOGRAM;  Surgeon: Jeffrie Oneil BROCKS, MD;  Location: [facility] INVASIVE CV LAB;  Service: Cardiovascular;  Laterality: N/A;    TRANSURETHRAL RESECTION OF BLADDER TUMOR N/A 10/21/2019   Procedure: TRANSURETHRAL RESECTION OF BLADDER TUMOR (TURBT);  Surgeon: Ottelin, Mark, MD;  Location: [facility];  Service: Urology;  Laterality: N/A;   TRANSURETHRAL RESECTION OF BLADDER TUMOR N/A 12/13/2019   Procedure: TRANSURETHRAL RESECTION OF BLADDER TUMOR (TURBT);  Surgeon: Alvaro Ricardo, MD;  Location: [facility];  Service: Urology;  Laterality: N/A;  1 HR   TRANSURETHRAL RESECTION OF BLADDER TUMOR N/A 10/14/2020   Procedure: TRANSURETHRAL RESECTION OF BLADDER TUMOR (TURBT);  Surgeon: Alvaro Ricardo, MD;  Location: [facility];  Service: Urology;  Laterality: N/A;   TRANSURETHRAL RESECTION OF BLADDER TUMOR N/A 12/16/2020   Procedure: RESTAGING TRANSURETHRAL RESECTION OF BLADDER TUMOR (TURBT);  Surgeon: Alvaro Ricardo, MD;  Location: [facility];  Service: Urology;  Laterality: N/A;   TRANSURETHRAL RESECTION  OF BLADDER TUMOR N/A 06/03/2022   Procedure: TRANSURETHRAL RESECTION OF BLADDER TUMOR (TURBT);  Surgeon: Alvaro Hummer, MD;  Location: [facility];  Service: Urology;  Laterality: N/A;   UMBILICAL HERNIA REPAIR  03/03/2023   Procedure: HERNIA REPAIR UMBILICAL;  Surgeon: Alvaro Hummer KATHEE Mickey., MD;  Location: [facility];  Service: Urology;;    SOCIAL HISTORY: Social History   Socioeconomic History   Marital status: Married    Spouse name: Not on file   Number of children: 4   Years of education: Not on file   Highest education level: Not on file  Occupational History   Occupation: Public House Manager: [employer]  Tobacco Use   Smoking status: Former    Current packs/day: 0.00    Average packs/day: 2.0 packs/day for 40.0 years (80.0 ttl pk-yrs)    Types: Cigarettes    Start date: 16    Quit date: 2012    Years since quitting: 13.8    Passive exposure: Never   Smokeless tobacco: Never  Vaping Use   Vaping status: Never Used  Substance  and Sexual Activity   Alcohol use: No    Alcohol/week: 0.0 standard drinks of alcohol   Drug use: No   Sexual activity: Not on file  Other Topics Concern   Not on file  Social History Narrative   Not on file   Social Drivers of Health   Financial Resource Strain: Low Risk  (02/15/2024)   Overall Financial Resource Strain (CARDIA)    Difficulty of Paying Living Expenses: Not hard at all  Food Insecurity: No Food Insecurity (02/15/2024)   Hunger Vital Sign    Worried About Running Out of Food in the Last Year: Never true    Ran Out of Food in the Last Year: Never true  Transportation Needs: No Transportation Needs (02/15/2024)   PRAPARE - Administrator, Civil Service (Medical): No    Lack of Transportation (Non-Medical): No  Physical Activity: Inactive (02/15/2024)   Exercise Vital Sign    Days of Exercise per Week: 0 days    Minutes of Exercise per Session: Not on file  Stress: Stress Concern Present (02/15/2024)   [employer] of Occupational Health - Occupational Stress Questionnaire    Feeling of Stress: To some extent  Social Connections: Unknown (02/15/2024)   Social Connection and Isolation Panel    Frequency of Communication with Friends and Family: Once a week    Frequency of Social Gatherings with Friends and Family: More than three times a week    Attends Religious Services: Patient declined    Database Administrator or Organizations: Patient declined    Attends Banker Meetings: Not on file    Marital Status: Married  Intimate Partner Violence: Not At Risk (07/04/2023)   Humiliation, Afraid, Rape, and Kick questionnaire    Fear of Current or Ex-Partner: No    Emotionally Abused: No    Physically Abused: No    Sexually Abused: No    FAMILY HISTORY: Family History  Problem Relation Age of Onset   Lung cancer Mother        lung   Esophageal cancer Cousin    Colon cancer Neg Hx    Rectal cancer Neg Hx    Stomach cancer Neg Hx      ALLERGIES:  has no known allergies.  MEDICATIONS:  Current Outpatient Medications  Medication Sig Dispense Refill   acetaminophen  (TYLENOL ) 325 MG tablet  Take 650 mg by mouth every 6 (six) hours as needed (for pain).     amLODipine  (NORVASC ) 5 MG tablet TAKE 1 TABLET (5 MG TOTAL) BY MOUTH DAILY. 90 tablet 1   aspirin  EC 81 MG tablet Take 81 mg by mouth in the morning.     carvedilol  (COREG ) 3.125 MG tablet TAKE 1 TABLET BY MOUTH TWICE A DAY WITH FOOD 180 tablet 1   cyanocobalamin  (VITAMIN B12) 1000 MCG tablet Take 1 tablet (1,000 mcg total) by mouth daily. 30 tablet 6   diazepam  (VALIUM ) 5 MG tablet TAKE 1 TABLET BY MOUTH EVERY 12 HOURS AS NEEDED FOR ANXIETY. 60 tablet 5   famotidine  (PEPCID ) 20 MG tablet Take 1 tablet (20 mg total) by mouth 2 (two) times daily for 6 days. (Patient taking differently: Take 20 mg by mouth 2 (two) times daily. As needed) 12 tablet 0   ferrous sulfate  325 (65 FE) MG tablet Take 1 tablet (325 mg total) by mouth daily with breakfast. Please take with a source of Vitamin C 90 tablet 3   furosemide  (LASIX ) 20 MG tablet TAKE 3 TABLETS BY MOUTH EVERY MORNING AND TAKE 2 TABLETS BY MOUTH EVERY EVENING 150 tablet 3   ipratropium (ATROVENT ) 0.03 % nasal spray Place 2 sprays into both nostrils every 12 (twelve) hours as needed for rhinitis.     lidocaine -prilocaine  (EMLA ) cream Apply topically once a week. 30 g 0   Magnesium  500 MG TABS Take 500 mg by mouth every morning.     meclizine  (ANTIVERT ) 25 MG tablet Take 1 tablet (25 mg total) by mouth 3 (three) times daily as needed for dizziness. 30 tablet 0   mirtazapine (REMERON) 7.5 MG tablet TAKE 1 TABLET BY MOUTH AT BEDTIME. 90 tablet 1   multivitamin-iron-minerals-folic acid  (CENTRUM) chewable tablet Chew 1 tablet by mouth daily.     nitroGLYCERIN  (NITROSTAT ) 0.4 MG SL tablet Place 0.4 mg under the tongue every 5 (five) minutes as needed for chest pain.     ondansetron  (ZOFRAN ) 8 MG tablet Take 1 tablet (8 mg total) by  mouth every 8 (eight) hours as needed. 30 tablet 0   pantoprazole  (PROTONIX ) 40 MG tablet TAKE 1 TABLET (40 MG TOTAL) BY MOUTH TWICE A DAY BEFORE MEALS 180 tablet 1   potassium chloride  SA (KLOR-CON  M20) 20 MEQ tablet Take 1 tablet (20 mEq total) by mouth daily. (Patient taking differently: Take 20 mEq by mouth 2 (two) times daily.)     prochlorperazine  (COMPAZINE ) 10 MG tablet Take 1 tablet (10 mg total) by mouth every 6 (six) hours as needed for nausea or vomiting. 30 tablet 0   rosuvastatin  (CRESTOR ) 20 MG tablet Take 1 tablet (20 mg total) by mouth daily. 90 tablet 3   temazepam  (RESTORIL ) 30 MG capsule Take 1 capsule (30 mg total) by mouth at bedtime as needed for sleep. 90 capsule 1   traMADol  (ULTRAM ) 50 MG tablet Take 1 tablet (50 mg total) by mouth every 6 (six) hours as needed. 30 tablet 0   venlafaxine  XR (EFFEXOR -XR) 150 MG 24 hr capsule Take 1 capsule (150 mg total) by mouth daily with breakfast. 90 capsule 3   No current facility-administered medications for this visit.    REVIEW OF SYSTEMS:   Constitutional: ( - ) fevers, ( - )  chills , ( - ) night sweats Eyes: ( - ) blurriness of vision, ( - ) double vision, ( - ) watery eyes Ears, nose, mouth, throat, and face: ( - )  mucositis, ( - ) sore throat Respiratory: ( - ) cough, ( - ) dyspnea, ( - ) wheezes Cardiovascular: ( - ) palpitation, ( - ) chest discomfort, ( - ) lower extremity swelling Gastrointestinal:  ( - ) nausea, ( - ) heartburn, ( - ) change in bowel habits Skin: ( - ) abnormal skin rashes Lymphatics: ( - ) new lymphadenopathy, ( - ) easy bruising Neurological: ( - ) numbness, ( - ) tingling, ( - ) new weaknesses Behavioral/Psych: ( - ) mood change, ( - ) new changes  All other systems were reviewed with the patient and are negative.  PHYSICAL EXAMINATION: ECOG PERFORMANCE STATUS: 1 - Symptomatic but completely ambulatory  There were no vitals filed for this visit.     There were no vitals filed for this  visit.      GENERAL: well appearing elderly Caucasian male in NAD  SKIN: skin color, texture, turgor are normal, no rashes or significant lesions EYES: conjunctiva are pink and non-injected, sclera clear LUNGS: clear to auscultation and percussion with normal breathing effort HEART: regular rate & rhythm and no murmurs and no lower extremity edema Musculoskeletal: no cyanosis of digits and no clubbing.  PSYCH: alert & oriented x 3, fluent speech NEURO: no focal motor/sensory deficits  LABORATORY DATA:  I have reviewed the data as listed    Latest Ref Rng & Units 02/08/2024    1:21 PM 02/01/2024    1:59 PM 01/25/2024   10:13 AM  CBC  WBC 4.0 - 10.5 K/uL 4.7  5.3  5.6   Hemoglobin 13.0 - 17.0 g/dL 8.5  8.7  8.8   Hematocrit 39.0 - 52.0 % 25.3  26.6  27.0   Platelets 150 - 400 K/uL 154  184  199        Latest Ref Rng & Units 02/08/2024    1:21 PM 02/01/2024    1:59 PM 01/25/2024   10:13 AM  CMP  Glucose 70 - 99 mg/dL 805  863  836   BUN 8 - 23 mg/dL 14  20  23    Creatinine 0.61 - 1.24 mg/dL 7.98  8.23  7.94   Sodium 135 - 145 mmol/L 135  136  135   Potassium 3.5 - 5.1 mmol/L 4.0  4.0  4.4   Chloride 98 - 111 mmol/L 103  104  102   CO2 22 - 32 mmol/L 25  27  27    Calcium  8.9 - 10.3 mg/dL 9.2  9.0  8.3    8.8   Total Protein 6.5 - 8.1 g/dL 7.6  7.8  7.9   Total Bilirubin 0.0 - 1.2 mg/dL 0.9  0.6  0.8   Alkaline Phos 38 - 126 U/L 108  95  105   AST 15 - 41 U/L 31  18  18    ALT 0 - 44 U/L 18  11  14      RADIOGRAPHIC STUDIES: IR NEPHROSTOMY EXCHANGE BILATERAL Result Date: 02/21/2024 INDICATION: Bladder cancer and bilateral nephrostomy tubes. Patient presents for routine exchange. EXAM: EXCHANGE OF BILATERAL NEPHROSTOMY TUBES WITH FLUOROSCOPY Physician: Juliene SAUNDERS. Philip, MD COMPARISON:  None Available. MEDICATIONS: 1% lidocaine  for local anesthetic ANESTHESIA/SEDATION: None CONTRAST:  25 mL OMNIPAQUE  IOHEXOL  300 MG/ML SOLN - administered into the collecting system(s) FLUOROSCOPY:  Radiation Exposure Index (as provided by the fluoroscopic device): 21 mGy Kerma COMPLICATIONS: None immediate. PROCEDURE: The procedure was explained to the patient. The risks and benefits of the procedure were discussed and the patient's  questions were addressed. Informed consent was obtained from the patient. Patient was placed prone. Both flanks were prepped and draped in sterile fashion. Maximal barrier sterile technique was utilized including caps, mask, sterile gowns, sterile gloves, sterile drape, hand hygiene and skin antiseptic. Contrast was injected through the right nephrostomy tube. Nephrostomy tube was cut and removed over a wire. New 10 French multipurpose drain was advanced over the wire and reconstituted in the renal pelvis. Contrast injection confirmed placement in the renal pelvis. Skin was anesthetized with 1% lidocaine . Right nephrostomy tube was sutured to skin and attached to a gravity bag. Contrast was injected through the left nephrostomy tube. Nephrostomy tube was cut and removed over a wire. New 10 French multipurpose drain was advanced over the wire and reconstituted in the renal pelvis. Contrast injection confirmed placement in the renal pelvis. Skin was anesthetized with 1% lidocaine . Left nephrostomy tube was sutured to skin and attached to a gravity bag. Fluoroscopic images were taken and saved for this procedure. FINDINGS: Left nephrostomy tube is reconstituted in left renal pelvis. Right nephrostomy tube is reconstituted in the right renal pelvis. IMPRESSION: Successful exchange of bilateral nephrostomy tubes with fluoroscopy. Electronically Signed   By: Juliene Balder M.D.   On: 02/21/2024 14:39     ASSESSMENT & PLAN Luis [name] 68 y.o. 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  After review of the labs, review of the records, and discussion with the patient the patients findings are  most consistent with BCG unresponsive high risk non-muscle invasive bladder cancer, not a candidate for cystectomy.  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 21 days until progression or intolerance up to 24 months. --Received 8 cycles of Pembrolizumab  on 08/12/2022-01/13/2023: Cycle 8 of Pembrolizumab   --On 03/03/2023, underwent Cystectomy/Prostatectomy showed infiltrative high-grade urothelial carcinoma, size 5.8 cm involving bladder dome, anterior wall and prostatic stroma. Tumor invades directly into prostatic stroma at apex and mid portion of the gland (pT4a). Metastatic urothelial cancer in one of two left common iliac lymph nodes.  -- Given the results of the pathology showing positive margins and spread to the lymph node we are considering radiation therapy versus chemotherapy.  For chemotherapy, recommend gemcitabine  and cisplatin x 4 cycles.  This would consist of gemcitabine  1000 mg/m on days 1 and 8 and cisplatin 70 mg/m on day 1 of 21-day cycle for 4 cycles. -- Due to kidney dysfunction and anemia, chemotherapy was deferred and patient underwent adjuvant radiation to the surgical bed from 06/19/2023 until 07/27/2023.  He received 50.4 Gray in 28 fractions. PLAN:  --Today is Cycle 5, Day 1 of Enfortumab therapy --Labs from today show white blood cell 6.5, hemoglobin 8.5, MCV 93.7, platelets 147. Cr. Stable at 2.03, LFTs WNL.  --Due for CT CAP in Dec 2025. CT scan on 01/22/2024 showed response to treatment with no evidence of progression.  --Proceed with treatment today without any dose modifications.  --RTC weekly for treatment and every 2 weeks for clinic visit.  # Skin Rash -- Occurred after receiving blood transfusion, etiology not entirely clear. -- Patient has been applying moisturizer to the skin and has been taking Benadryl . -- Skin is persistently dry and itchy and  erythematous. -- Completed steroid taper with 60 mg for 5 days, 40 for 5 days, and 20 for 5 days.  #Anemia --Hemoglobin  8.5  --May be multifactorial due to nutritional deficiency as well as kidney dysfunction. -- Nutritional labs show borderline vitamin B12 deficiency and iron deficiency. -- Patient is currently taking vitamin B12 p.o. daily and ferrous sulfate  every other day. -- Receives vitamin B12 1000 mcg injection monthly, last received on 01/04/2024. -- Last received IV monoferric  1000 mg x 1 dose on 08/25/2023.  -- Will recheck vitamin B12 and iron levels are next visit.   #Hypokalemia: --Potassium level is 4.0 today --Patient is currently taking potassium supplement 20 meq BID. Advised to continue  #Flank pain--improved: --Secondary to bilateral nephrostomy. Patient is currently taking tylenol  with minimal improvement.  --Has prescription for tramadol  50 mg PO q 6 hours as needed.   # AKI -- Currently has bilateral nephrostomies, underwent exchange on yesterday, 01/10/2024. --monitoring Cr at each visit.   #Supportive Care -- chemotherapy education complete -- port placed -- zofran  8mg  q8H PRN and compazine  10mg  PO q6H for nausea -- EMLA  cream for port -- no pain medication required at this time.    No orders of the defined types were placed in this encounter.  All questions were answered. The patient knows to call the clinic with any problems, questions or concerns.  I have spent a total of 30 minutes minutes of face-to-face and non-face-to-face time, preparing to see the patient, performing a medically appropriate examination, counseling and educating the patient, documenting clinical information in the electronic health record,and care coordination.   Norleen IVAR Kidney, MD Department of Hematology/Oncology [facility] Phone: [phone] Pager: [phone] Email: [email or web address]

## 2024-02-22 NOTE — Patient Instructions (Signed)
 [facility] - A DEPT OF [facility]   Discharge Instructions: Thank you for choosing [facility] to provide your oncology and hematology care.   If you have a lab appointment with the Cancer Center, please go directly to the Cancer Center and check in at the registration area.   Wear comfortable clothing and clothing appropriate for easy access to any Portacath or PICC line.   We strive to give you quality time with your provider. You may need to reschedule your appointment if you arrive late (15 or more minutes).  Arriving late affects you and other patients whose appointments are after yours.  Also, if you miss three or more appointments without notifying the office, you may be dismissed from the clinic at the provider's discretion.      For prescription refill requests, have your pharmacy contact our office and allow 72 hours for refills to be completed.    Today you received the following chemotherapy and/or immunotherapy agents: Enfortumumab vedotin (Padcev )      To help prevent nausea and vomiting after your treatment, we encourage you to take your nausea medication as directed.  BELOW ARE SYMPTOMS THAT SHOULD BE REPORTED IMMEDIATELY: *FEVER GREATER THAN 100.4 F (38 C) OR HIGHER *CHILLS OR SWEATING *NAUSEA AND VOMITING THAT IS NOT CONTROLLED WITH YOUR NAUSEA MEDICATION *UNUSUAL SHORTNESS OF BREATH *UNUSUAL BRUISING OR BLEEDING *URINARY PROBLEMS (pain or burning when urinating, or frequent urination) *BOWEL PROBLEMS (unusual diarrhea, constipation, pain near the anus) TENDERNESS IN MOUTH AND THROAT WITH OR WITHOUT PRESENCE OF ULCERS (sore throat, sores in mouth, or a toothache) UNUSUAL RASH, SWELLING OR PAIN  UNUSUAL VAGINAL DISCHARGE OR ITCHING   Items with * indicate a potential emergency and should be followed up as soon as possible or go to the Emergency Department if any problems should occur.  Please show the CHEMOTHERAPY ALERT  CARD or IMMUNOTHERAPY ALERT CARD at check-in to the Emergency Department and triage nurse.  Should you have questions after your visit or need to cancel or reschedule your appointment, please contact [facility] - A DEPT OF [facility]  Dept: [phone]  and follow the prompts.  Office hours are 8:00 a.m. to 4:30 p.m. Monday - Friday. Please note that voicemails left after 4:00 p.m. may not be returned until the following business day.  We are closed weekends and major holidays. You have access to a nurse at all times for urgent questions. Please call the main number to the clinic Dept: [phone] and follow the prompts.   For any non-urgent questions, you may also contact your provider using MyChart. We now offer e-Visits for anyone 51 and older to request care online for non-urgent symptoms. For details visit [email or web address].   Also download the MyChart app! Go to the app store, search MyChart, open the app, select , and log in with your MyChart username and password.

## 2024-02-24 ENCOUNTER — Other Ambulatory Visit (HOSPITAL_COMMUNITY): Payer: Self-pay | Admitting: Cardiology

## 2024-02-26 ENCOUNTER — Encounter: Payer: Self-pay | Admitting: Radiology

## 2024-02-27 ENCOUNTER — Other Ambulatory Visit: Payer: Self-pay

## 2024-02-28 DIAGNOSIS — Z8551 Personal history of malignant neoplasm of bladder: Secondary | ICD-10-CM | POA: Diagnosis not present

## 2024-02-29 ENCOUNTER — Inpatient Hospital Stay

## 2024-02-29 ENCOUNTER — Inpatient Hospital Stay: Attending: Hematology and Oncology

## 2024-02-29 ENCOUNTER — Other Ambulatory Visit: Payer: Self-pay

## 2024-02-29 VITALS — BP 134/63 | HR 63 | Temp 97.8°F | Resp 16 | Wt 221.8 lb

## 2024-02-29 DIAGNOSIS — Z95828 Presence of other vascular implants and grafts: Secondary | ICD-10-CM

## 2024-02-29 DIAGNOSIS — Z79899 Other long term (current) drug therapy: Secondary | ICD-10-CM | POA: Diagnosis not present

## 2024-02-29 DIAGNOSIS — Z5112 Encounter for antineoplastic immunotherapy: Secondary | ICD-10-CM | POA: Insufficient documentation

## 2024-02-29 DIAGNOSIS — C679 Malignant neoplasm of bladder, unspecified: Secondary | ICD-10-CM

## 2024-02-29 DIAGNOSIS — D649 Anemia, unspecified: Secondary | ICD-10-CM | POA: Insufficient documentation

## 2024-02-29 DIAGNOSIS — C673 Malignant neoplasm of anterior wall of bladder: Secondary | ICD-10-CM | POA: Diagnosis not present

## 2024-02-29 DIAGNOSIS — C671 Malignant neoplasm of dome of bladder: Secondary | ICD-10-CM | POA: Diagnosis not present

## 2024-02-29 LAB — CBC WITH DIFFERENTIAL (CANCER CENTER ONLY)
Abs Immature Granulocytes: 0.08 K/uL — ABNORMAL HIGH (ref 0.00–0.07)
Basophils Absolute: 0 K/uL (ref 0.0–0.1)
Basophils Relative: 1 %
Eosinophils Absolute: 0 K/uL (ref 0.0–0.5)
Eosinophils Relative: 0 %
HCT: 25 % — ABNORMAL LOW (ref 39.0–52.0)
Hemoglobin: 8 g/dL — ABNORMAL LOW (ref 13.0–17.0)
Immature Granulocytes: 1 %
Lymphocytes Relative: 9 %
Lymphs Abs: 0.6 K/uL — ABNORMAL LOW (ref 0.7–4.0)
MCH: 29.5 pg (ref 26.0–34.0)
MCHC: 32 g/dL (ref 30.0–36.0)
MCV: 92.3 fL (ref 80.0–100.0)
Monocytes Absolute: 0.6 K/uL (ref 0.1–1.0)
Monocytes Relative: 9 %
Neutro Abs: 5.2 K/uL (ref 1.7–7.7)
Neutrophils Relative %: 80 %
Platelet Count: 180 K/uL (ref 150–400)
RBC: 2.71 MIL/uL — ABNORMAL LOW (ref 4.22–5.81)
RDW: 19.2 % — ABNORMAL HIGH (ref 11.5–15.5)
WBC Count: 6.5 K/uL (ref 4.0–10.5)
nRBC: 0 % (ref 0.0–0.2)

## 2024-02-29 LAB — CMP (CANCER CENTER ONLY)
ALT: 23 U/L (ref 0–44)
AST: 25 U/L (ref 15–41)
Albumin: 3.4 g/dL — ABNORMAL LOW (ref 3.5–5.0)
Alkaline Phosphatase: 137 U/L — ABNORMAL HIGH (ref 38–126)
Anion gap: 7 (ref 5–15)
BUN: 22 mg/dL (ref 8–23)
CO2: 25 mmol/L (ref 22–32)
Calcium: 8.6 mg/dL — ABNORMAL LOW (ref 8.9–10.3)
Chloride: 102 mmol/L (ref 98–111)
Creatinine: 2.01 mg/dL — ABNORMAL HIGH (ref 0.61–1.24)
GFR, Estimated: 35 mL/min — ABNORMAL LOW (ref >60)
Glucose, Bld: 176 mg/dL — ABNORMAL HIGH (ref 70–99)
Potassium: 4.2 mmol/L (ref 3.5–5.1)
Sodium: 134 mmol/L — ABNORMAL LOW (ref 135–145)
Total Bilirubin: 0.7 mg/dL (ref 0.0–1.2)
Total Protein: 7.4 g/dL (ref 6.5–8.1)

## 2024-02-29 MED ORDER — PROCHLORPERAZINE MALEATE 10 MG PO TABS
10.0000 mg | ORAL_TABLET | Freq: Once | ORAL | Status: AC
  Administered 2024-02-29: 10 mg via ORAL
  Filled 2024-02-29: qty 1

## 2024-02-29 MED ORDER — SODIUM CHLORIDE 0.9 % IV SOLN
125.0000 mg | Freq: Once | INTRAVENOUS | Status: AC
  Administered 2024-02-29: 125 mg via INTRAVENOUS
  Filled 2024-02-29: qty 10.5

## 2024-02-29 MED ORDER — CYANOCOBALAMIN 1000 MCG/ML IJ SOLN
1000.0000 ug | Freq: Once | INTRAMUSCULAR | Status: AC
  Administered 2024-02-29: 1000 ug via INTRAMUSCULAR
  Filled 2024-02-29: qty 1

## 2024-02-29 MED ORDER — SODIUM CHLORIDE 0.9 % IV SOLN: INTRAVENOUS | Status: DC

## 2024-02-29 NOTE — Patient Instructions (Signed)
 [facility] - A DEPT OF [facility]   Discharge Instructions: Thank you for choosing [facility] to provide your oncology and hematology care.   If you have a lab appointment with the Cancer Center, please go directly to the Cancer Center and check in at the registration area.   Wear comfortable clothing and clothing appropriate for easy access to any Portacath or PICC line.   We strive to give you quality time with your provider. You may need to reschedule your appointment if you arrive late (15 or more minutes).  Arriving late affects you and other patients whose appointments are after yours.  Also, if you miss three or more appointments without notifying the office, you may be dismissed from the clinic at the provider's discretion.      For prescription refill requests, have your pharmacy contact our office and allow 72 hours for refills to be completed.    Today you received the following chemotherapy and/or immunotherapy agents: Enfortumumab vedotin (Padcev )      To help prevent nausea and vomiting after your treatment, we encourage you to take your nausea medication as directed.  BELOW ARE SYMPTOMS THAT SHOULD BE REPORTED IMMEDIATELY: *FEVER GREATER THAN 100.4 F (38 C) OR HIGHER *CHILLS OR SWEATING *NAUSEA AND VOMITING THAT IS NOT CONTROLLED WITH YOUR NAUSEA MEDICATION *UNUSUAL SHORTNESS OF BREATH *UNUSUAL BRUISING OR BLEEDING *URINARY PROBLEMS (pain or burning when urinating, or frequent urination) *BOWEL PROBLEMS (unusual diarrhea, constipation, pain near the anus) TENDERNESS IN MOUTH AND THROAT WITH OR WITHOUT PRESENCE OF ULCERS (sore throat, sores in mouth, or a toothache) UNUSUAL RASH, SWELLING OR PAIN  UNUSUAL VAGINAL DISCHARGE OR ITCHING   Items with * indicate a potential emergency and should be followed up as soon as possible or go to the Emergency Department if any problems should occur.  Please show the CHEMOTHERAPY ALERT  CARD or IMMUNOTHERAPY ALERT CARD at check-in to the Emergency Department and triage nurse.  Should you have questions after your visit or need to cancel or reschedule your appointment, please contact [facility] - A DEPT OF [facility]  Dept: [phone]  and follow the prompts.  Office hours are 8:00 a.m. to 4:30 p.m. Monday - Friday. Please note that voicemails left after 4:00 p.m. may not be returned until the following business day.  We are closed weekends and major holidays. You have access to a nurse at all times for urgent questions. Please call the main number to the clinic Dept: [phone] and follow the prompts.   For any non-urgent questions, you may also contact your provider using MyChart. We now offer e-Visits for anyone 51 and older to request care online for non-urgent symptoms. For details visit [email or web address].   Also download the MyChart app! Go to the app store, search MyChart, open the app, select , and log in with your MyChart username and password.

## 2024-03-01 LAB — PTH, INTACT AND CALCIUM
Calcium, Total (PTH): 8.3 mg/dL — ABNORMAL LOW (ref 8.6–10.2)
PTH: 130 pg/mL — ABNORMAL HIGH (ref 15–65)

## 2024-03-06 ENCOUNTER — Other Ambulatory Visit: Payer: Self-pay

## 2024-03-07 ENCOUNTER — Inpatient Hospital Stay

## 2024-03-07 ENCOUNTER — Other Ambulatory Visit: Payer: Self-pay | Admitting: *Deleted

## 2024-03-07 ENCOUNTER — Telehealth: Payer: Self-pay | Admitting: *Deleted

## 2024-03-07 ENCOUNTER — Inpatient Hospital Stay: Admitting: Hematology and Oncology

## 2024-03-07 ENCOUNTER — Inpatient Hospital Stay: Admitting: Dietician

## 2024-03-07 VITALS — BP 117/60 | HR 69 | Temp 98.4°F | Resp 19 | Wt 219.6 lb

## 2024-03-07 DIAGNOSIS — C679 Malignant neoplasm of bladder, unspecified: Secondary | ICD-10-CM | POA: Diagnosis not present

## 2024-03-07 DIAGNOSIS — Z95828 Presence of other vascular implants and grafts: Secondary | ICD-10-CM

## 2024-03-07 DIAGNOSIS — D508 Other iron deficiency anemias: Secondary | ICD-10-CM

## 2024-03-07 LAB — CBC WITH DIFFERENTIAL (CANCER CENTER ONLY)
Abs Immature Granulocytes: 0.04 K/uL (ref 0.00–0.07)
Basophils Absolute: 0 K/uL (ref 0.0–0.1)
Basophils Relative: 0 %
Eosinophils Absolute: 0 K/uL (ref 0.0–0.5)
Eosinophils Relative: 1 %
HCT: 23.8 % — ABNORMAL LOW (ref 39.0–52.0)
Hemoglobin: 7.7 g/dL — ABNORMAL LOW (ref 13.0–17.0)
Immature Granulocytes: 1 %
Lymphocytes Relative: 5 %
Lymphs Abs: 0.3 K/uL — ABNORMAL LOW (ref 0.7–4.0)
MCH: 29.6 pg (ref 26.0–34.0)
MCHC: 32.4 g/dL (ref 30.0–36.0)
MCV: 91.5 fL (ref 80.0–100.0)
Monocytes Absolute: 0.7 K/uL (ref 0.1–1.0)
Monocytes Relative: 10 %
Neutro Abs: 5.4 K/uL (ref 1.7–7.7)
Neutrophils Relative %: 83 %
Platelet Count: 165 K/uL (ref 150–400)
RBC: 2.6 MIL/uL — ABNORMAL LOW (ref 4.22–5.81)
RDW: 19.7 % — ABNORMAL HIGH (ref 11.5–15.5)
WBC Count: 6.4 K/uL (ref 4.0–10.5)
nRBC: 0 % (ref 0.0–0.2)

## 2024-03-07 LAB — CMP (CANCER CENTER ONLY)
ALT: 13 U/L (ref 0–44)
AST: 22 U/L (ref 15–41)
Albumin: 3.4 g/dL — ABNORMAL LOW (ref 3.5–5.0)
Alkaline Phosphatase: 114 U/L (ref 38–126)
Anion gap: 7 (ref 5–15)
BUN: 15 mg/dL (ref 8–23)
CO2: 24 mmol/L (ref 22–32)
Calcium: 8.6 mg/dL — ABNORMAL LOW (ref 8.9–10.3)
Chloride: 103 mmol/L (ref 98–111)
Creatinine: 1.87 mg/dL — ABNORMAL HIGH (ref 0.61–1.24)
GFR, Estimated: 38 mL/min — ABNORMAL LOW (ref >60)
Glucose, Bld: 178 mg/dL — ABNORMAL HIGH (ref 70–99)
Potassium: 4.1 mmol/L (ref 3.5–5.1)
Sodium: 134 mmol/L — ABNORMAL LOW (ref 135–145)
Total Bilirubin: 0.7 mg/dL (ref 0.0–1.2)
Total Protein: 7.3 g/dL (ref 6.5–8.1)

## 2024-03-07 LAB — SAMPLE TO BLOOD BANK

## 2024-03-07 LAB — PREPARE RBC (CROSSMATCH)

## 2024-03-07 MED ORDER — FAMOTIDINE IN NACL 20-0.9 MG/50ML-% IV SOLN
20.0000 mg | Freq: Once | INTRAVENOUS | Status: AC
  Administered 2024-03-07: 20 mg via INTRAVENOUS
  Filled 2024-03-07: qty 50

## 2024-03-07 MED ORDER — METHYLPREDNISOLONE SODIUM SUCC 125 MG IJ SOLR
62.5000 mg | Freq: Once | INTRAMUSCULAR | Status: AC
  Administered 2024-03-07: 62.5 mg via INTRAVENOUS
  Filled 2024-03-07: qty 2

## 2024-03-07 MED ORDER — SODIUM CHLORIDE 0.9% IV SOLUTION
250.0000 mL | INTRAVENOUS | Status: DC
  Administered 2024-03-07: 100 mL via INTRAVENOUS

## 2024-03-07 MED ORDER — ACETAMINOPHEN 325 MG PO TABS
650.0000 mg | ORAL_TABLET | Freq: Once | ORAL | Status: AC
  Administered 2024-03-07: 650 mg via ORAL
  Filled 2024-03-07: qty 2

## 2024-03-07 MED ORDER — SODIUM CHLORIDE 0.9% FLUSH: 10.0000 mL | INTRAVENOUS | Status: DC | PRN

## 2024-03-07 MED ORDER — DIPHENHYDRAMINE HCL 25 MG PO CAPS
50.0000 mg | ORAL_CAPSULE | Freq: Once | ORAL | Status: AC
  Administered 2024-03-07: 50 mg via ORAL
  Filled 2024-03-07: qty 2

## 2024-03-07 NOTE — Telephone Encounter (Signed)
 Received call from scheduler that pt's wife called saying that Adriana felt too weak to come in for treatment. Asking for call back. TCT pt's wife. Spoke with her and also with pt.  She states that he is feeling bad today and does not want to come for treatment. States he is very fatigued-doesn't have the energy to take a shower even. Advised that we could at least check his labs and see if he needs a transfusion as his HGB was i8.0 last week. Pt said he would think about it-so he may or may not show today.  Dr. Federico aware.

## 2024-03-07 NOTE — Progress Notes (Signed)
 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 Completed adjuvant radiation 2/24-4/3. He is currently receiving Enfortumab (start 7/10).   Treatment held today - pt receiving 1 unit PRBC  Met with patient and wife in infusion. He has been spending more time in bed per wife. States pt wanted to cancel appointments today due to fatigue and weakness. Wife is glad that she was able to talk him into coming. Anorexia continues. Eating a couple bites at meal times. Patient last good meal was last week when wife made vegetable beef soup. He said this tasted good and went down easy. Did not eat any of the smoked pork chops at dinner last night. Patient has not wanted meats like he usually would. He is drinking Ensure Complete, but has not been doing daily as recommended.    Medications: reviewed   Labs: glucose 178, Cr 1.87, albumin  3.4, Hgb 7.7   Anthropometrics: Wts continue to trend down - 219 lb 9.6 oz today decreased 3.5% in 3 weeks, 16% in the last 14 weeks (severe for time frame)  10/30 - 227 lb 6.4 oz  10/9 - 232 lb 12 oz  9/11 - 241 lb 8 oz  8/6 - 261 lb 6.4 oz   NUTRITION DIAGNOSIS: Unintended wt loss continues - encouraging daily ONS   INTERVENTION:  Suggested offering foods that are soft/moist or smooth for ease of intake Encourage daily Ensure Complete - splitting over multiple servings if needed (samples + coupons provided) Support and encouragement     MONITORING, EVALUATION, GOAL: wt trends, intake   NEXT VISIT: Thursday December 4 during infusion

## 2024-03-07 NOTE — Progress Notes (Signed)
 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 Federico  08/12/2022: Cycle 1 of Pembrolizumab  09/02/2022: Cycle 2 of Pembrolizumab   09/23/2022: Cycle 3 of Pembrolizumab  10/14/2022: Cycle 4 of Pembrolizumab  11/04/2022: Cycle 5 of Pembrolizumab  11/25/2022: Cycle 6 of Pembrolizumab  12/20/2022: Cycle 7 of Pembrolizumab  01/13/2023: Cycle 8 of Pembrolizumab   03/03/2023: Cystectomy/Prostatectomy showed infiltrative high-grade urothelial carcinoma, size 5.8 cm involving bladder dome, anterior wall and prostatic stroma. Tumor invades directly into prostatic stroma at apex and mid portion of the gland (pT4a). Metastatic urothelial cancer in one of two left common iliac lymph nodes.  06/19/2023-07/27/2023: Received adjuvant radiation to surgical bed.  He received 50.4 Gray in 28 fractions. 11/02/2023: Cycle 1 Day 1 of Enfortumab.  11/29/2023:  Cycle 2 Day 1 of Enfortumab.  12/28/2023: Cycle 3 Day 1 of Enfortumab.   CHIEF COMPLAINTS/PURPOSE OF CONSULTATION:  High Risk Non-Muscle Invasive Bladder Cancer   HISTORY OF PRESENTING ILLNESS:  Luis [name] 68 y.o. male with medical  history significant for BCG unresponsive high risk nonmuscle invasive bladder cancer who presents for a follow up visit.  He was last seen on 02/22/2024.  He presents today to continue Enfortumab treatment.   On exam today Luis [name] reports he has been having a little bit of nosebleeding on and off.  He reports that it can happen daily and that he does soak several tissues or paper towels when it starts to bleed.  He reports is not having any other side effects from his treatment.  He denies any nausea, vomiting, or diarrhea.  He does have some occasional dry heaving but nothing comes up.  He reports he is having no fevers, chills, sweats.  He does have some chills at night.  His appetite is getting worse and he only eats about 1-2 bites of the meal he has lost a total of 8 pounds.  He is looking forward to Thanksgiving meal and is trying to improve his appetite before that time.  Otherwise he has had no infectious symptoms.  Full 10 point ROS otherwise negative.  Given his declining weight and marked fatigue I would recommend he take a break from chemotherapy today.  He was agreeable to getting fluids and infusion and taking a break from chemotherapy.  We will regroup in time for his next cycle.  MEDICAL HISTORY:  Past Medical History:  Diagnosis Date   Anxiety    takes Valium  as needed   Aortic stenosis, moderate    Arthritis    Ascending aortic aneurysm    CAD (coronary artery disease)    a. s/p PCI of the RCA 8/12 with DES by Dr Wonda, preserved EF. b. LHC/RHC (2/16) with mean RA 12, PA 32/15, mean PCWP 18, CI 3.47; patent mid and distal RCA stents, 50-60% proximal stenosis small  PDA.      Cancer (HCC)    bladder   Carotid stenosis    a. Carotid US  (05/2013):  Bilateral 1-39% ICA; L thyroid  nodule (prior hx of aspiration).   Chronic diastolic CHF (congestive heart failure) (HCC)    Complication of anesthesia    difficulty waking up after gallbladder surgery   Depression    Dyspnea     Essential hypertension    GERD (gastroesophageal reflux disease)    if needed will take OTC meds    Heart murmur    History of colonic polyps    hyperplastic   Hyperlipidemia    Joint pain    Lesion of bladder    Myocardial infarction (HCC) 2012   Obesity (BMI 30-39.9) 02/29/2016   Pre-diabetes    Restless leg    Sleep apnea    uses cpap   Tubular adenoma of colon    Vertigo    takes Meclizine  as needed    SURGICAL HISTORY: Past Surgical History:  Procedure Laterality Date   AORTIC VALVE REPLACEMENT N/A 09/16/2021   Procedure: AORTIC VALVE REPLACEMENT (AVR);  Surgeon: Lucas Dorise POUR, MD;  Location: [facility] OR;  Service: Open Heart Surgery;  Laterality: N/A;   CATARACT EXTRACTION   4 YRS AGO   BOTH EYES   CHOLECYSTECTOMY  07/21/2011   Procedure: LAPAROSCOPIC CHOLECYSTECTOMY WITH INTRAOPERATIVE CHOLANGIOGRAM;  Surgeon: Alm VEAR Angle, MD;  Location: [facility] ORS;  Service: General;  Laterality: N/A;   CORONARY ANGIOPLASTY  2012   2 stents   coronary stenting     s/p PCI of the RCA by Dr Wonda 8/12 with 2 promus stents   CYSTOSCOPY W/ RETROGRADES Bilateral 12/13/2019   Procedure: CYSTOSCOPY WITH RETROGRADE PYELOGRAM;  Surgeon: Alvaro Hummer, MD;  Location: [facility];  Service: Urology;  Laterality: Bilateral;   CYSTOSCOPY W/ RETROGRADES Bilateral 10/14/2020   Procedure: CYSTOSCOPY WITH RETROGRADE PYELOGRAM;  Surgeon: Alvaro Hummer, MD;  Location: [facility];  Service: Urology;  Laterality: Bilateral;   CYSTOSCOPY W/ RETROGRADES Bilateral 12/16/2020   Procedure: CYSTOSCOPY WITH RETROGRADE PYELOGRAM;  Surgeon: Alvaro Hummer, MD;  Location: [facility];  Service: Urology;  Laterality: Bilateral;   CYSTOSCOPY W/ RETROGRADES Bilateral 06/03/2022   Procedure: CYSTOSCOPY WITH RETROGRADE PYELOGRAM;  Surgeon: Alvaro Hummer, MD;  Location: [facility];  Service: Urology;  Laterality: Bilateral;   CYSTOSCOPY W/ RETROGRADES Bilateral 12/28/2022   Procedure:  CYSTOSCOPY WITH RETROGRADE PYELOGRAM, FULGARATION OF BLEEDERS;  Surgeon: Alvaro Hummer KATHEE Mickey., MD;  Location: [facility];  Service: Urology;  Laterality: Bilateral;   CYSTOSCOPY WITH INJECTION N/A 03/03/2023   Procedure: CYSTOSCOPY WITH INDOCYANINE INJECTION;  Surgeon: Alvaro Hummer KATHEE Mickey., MD;  Location: [facility];  Service: Urology;  Laterality: N/A;  360 MINUTES   ESOPHAGOGASTRODUODENOSCOPY N/A 06/28/2023   Procedure: EGD (ESOPHAGOGASTRODUODENOSCOPY);  Surgeon: Charlanne Groom, MD;  Location: [facility];  Service: Gastroenterology;  Laterality: N/A;   IR IMAGING GUIDED PORT INSERTION  05/29/2023   IR NEPHROSTOMY EXCHANGE LEFT  08/07/2023   IR NEPHROSTOMY EXCHANGE LEFT  10/02/2023   IR NEPHROSTOMY EXCHANGE LEFT  11/27/2023   IR NEPHROSTOMY EXCHANGE LEFT  01/10/2024   IR NEPHROSTOMY EXCHANGE LEFT  02/21/2024   IR NEPHROSTOMY PLACEMENT RIGHT  07/01/2023   IR RADIOLOGY PERIPHERAL GUIDED IV START  07/01/2023   IR US  GUIDE VASC ACCESS LEFT  07/01/2023   LEFT AND RIGHT HEART CATHETERIZATION WITH CORONARY ANGIOGRAM N/A 06/23/2014   Procedure: LEFT AND RIGHT HEART CATHETERIZATION WITH CORONARY ANGIOGRAM;  Surgeon:  Ezra GORMAN Shuck, MD;  Location: [facility] CATH LAB;  Service: Cardiovascular;  Laterality: N/A;   NECK SURGERY  03/23/09   per Dr. Barbarann, cervical fusion    PERICARDIOCENTESIS N/A 09/28/2021   Procedure: PERICARDIOCENTESIS;  Surgeon: Dann Candyce GORMAN, MD;  Location: [facility] INVASIVE CV LAB;  Service: Cardiovascular;  Laterality: N/A;   REPLACEMENT ASCENDING AORTA N/A 09/16/2021   Procedure: REPLACEMENT ASCENDING AORTA WITH 30 X HEMASHIELD PLATINUM WOVEN DOUBLE VELOUR VASCULAR GRAFT;  Surgeon: Lucas Dorise POUR, MD;  Location: [facility] OR;  Service: Open Heart Surgery;  Laterality: N/A;  CIRC ARREST   right elbow surgery     RIGHT HEART CATH N/A 06/15/2023   Procedure: RIGHT HEART CATH;  Surgeon: Shuck Ezra GORMAN, MD;  Location: [facility] INVASIVE CV LAB;  Service: Cardiovascular;  Laterality: N/A;   RIGHT HEART CATH AND CORONARY  ANGIOGRAPHY N/A 07/08/2021   Procedure: RIGHT HEART CATH AND CORONARY ANGIOGRAPHY;  Surgeon: Shuck Ezra GORMAN, MD;  Location: [facility] INVASIVE CV LAB;  Service: Cardiovascular;  Laterality: N/A;   ROBOT ASSISTED LAPAROSCOPIC COMPLETE CYSTECT ILEAL CONDUIT N/A 03/03/2023   Procedure: XI ROBOTIC ASSISTED LAPAROSCOPIC COMPLETE CYSTECTECTOMY WITH  ILEAL CONDUIT DIVERSION;  Surgeon: Alvaro Ricardo KATHEE Mickey., MD;  Location: [facility];  Service: Urology;  Laterality: N/A;   ROBOT ASSISTED LAPAROSCOPIC RADICAL PROSTATECTOMY N/A 03/03/2023   Procedure: XI ROBOTIC ASSISTED LAPAROSCOPIC RADICAL PROSTATECTOMY WITH LYMPH NODE DISSECTION;  Surgeon: Alvaro Ricardo KATHEE Mickey., MD;  Location: [facility];  Service: Urology;  Laterality: N/A;   solonscopy  05/23/08   per Dr. Jakie inch hemorrhoids only, repeat in 5 years   SUBXYPHOID PERICARDIAL WINDOW N/A 09/28/2021   Procedure: SUBXYPHOID PERICARDIAL WINDOW;  Surgeon: Lucas Dorise POUR, MD;  Location: [facility] OR;  Service: Thoracic;  Laterality: N/A;   TEE WITHOUT CARDIOVERSION N/A 06/23/2014   Procedure: TRANSESOPHAGEAL ECHOCARDIOGRAM (TEE);  Surgeon: Ezra GORMAN Shuck, MD;  Location: [facility] ENDOSCOPY;  Service: Cardiovascular;  Laterality: N/A;   TEE WITHOUT CARDIOVERSION N/A 01/21/2016   Procedure: TRANSESOPHAGEAL ECHOCARDIOGRAM (TEE);  Surgeon: Ezra GORMAN Shuck, MD;  Location: [facility] ENDOSCOPY;  Service: Cardiovascular;  Laterality: N/A;   TEE WITHOUT CARDIOVERSION N/A 09/16/2021   Procedure: TRANSESOPHAGEAL ECHOCARDIOGRAM (TEE);  Surgeon: Lucas Dorise POUR, MD;  Location[facility];  Service: Open Heart Surgery;  Laterality: N/A;   TEE WITHOUT CARDIOVERSION N/A 09/28/2021   Procedure: TRANSESOPHAGEAL ECHOCARDIOGRAM (TEE);  Surgeon: Lucas Dorise POUR, MD;  Location: [facility] OR;  Service: Thoracic;  Laterality: N/A;   TONSILLECTOMY     TRANSESOPHAGEAL ECHOCARDIOGRAM (CATH LAB) N/A 04/17/2023   Procedure: TRANSESOPHAGEAL ECHOCARDIOGRAM;  Surgeon: Jeffrie Oneil BROCKS, MD;  Location: [facility] INVASIVE CV LAB;  Service:  Cardiovascular;  Laterality: N/A;   TRANSURETHRAL RESECTION OF BLADDER TUMOR N/A 10/21/2019   Procedure: TRANSURETHRAL RESECTION OF BLADDER TUMOR (TURBT);  Surgeon: Ottelin, Mark, MD;  Location: [facility];  Service: Urology;  Laterality: N/A;   TRANSURETHRAL RESECTION OF BLADDER TUMOR N/A 12/13/2019   Procedure: TRANSURETHRAL RESECTION OF BLADDER TUMOR (TURBT);  Surgeon: Alvaro Ricardo, MD;  Location: [facility];  Service: Urology;  Laterality: N/A;  1 HR   TRANSURETHRAL RESECTION OF BLADDER TUMOR N/A 10/14/2020   Procedure: TRANSURETHRAL RESECTION OF BLADDER TUMOR (TURBT);  Surgeon: Alvaro Ricardo, MD;  Location: [facility];  Service: Urology;  Laterality: N/A;   TRANSURETHRAL RESECTION OF BLADDER TUMOR N/A 12/16/2020   Procedure: RESTAGING TRANSURETHRAL RESECTION OF BLADDER TUMOR (TURBT);  Surgeon: Alvaro Ricardo, MD;  Location: [facility];  Service: Urology;  Laterality: N/A;   TRANSURETHRAL RESECTION OF BLADDER TUMOR N/A 06/03/2022   Procedure: TRANSURETHRAL RESECTION OF BLADDER TUMOR (TURBT);  Surgeon: Alvaro Hummer, MD;  Location: [facility];  Service: Urology;  Laterality: N/A;   UMBILICAL HERNIA REPAIR  03/03/2023   Procedure: HERNIA REPAIR UMBILICAL;  Surgeon: Alvaro Hummer KATHEE Mickey., MD;  Location: [facility];  Service: Urology;;    SOCIAL HISTORY: Social History   Socioeconomic History   Marital status: Married    Spouse name: Not on file   Number of children: 4   Years of education: Not on file   Highest education level: Not on file  Occupational History   Occupation: Public House Manager: [employer]  Tobacco Use   Smoking status: Former    Current packs/day: 0.00    Average packs/day: 2.0 packs/day for 40.0 years (80.0 ttl pk-yrs)    Types: Cigarettes    Start date: 22    Quit date: 2012    Years since quitting: 13.8    Passive exposure: Never   Smokeless tobacco: Never  Vaping Use    Vaping status: Never Used  Substance and Sexual Activity   Alcohol use: No    Alcohol/week: 0.0 standard drinks of alcohol   Drug use: No   Sexual activity: Not on file  Other Topics Concern   Not on file  Social History Narrative   Not on file   Social Drivers of Health   Financial Resource Strain: Low Risk  (02/15/2024)   Overall Financial Resource Strain (CARDIA)    Difficulty of Paying Living Expenses: Not hard at all  Food Insecurity: No Food Insecurity (02/15/2024)   Hunger Vital Sign    Worried About Running Out of Food in the Last Year: Never true    Ran Out of Food in the Last Year: Never true  Transportation Needs: No Transportation Needs (02/15/2024)   PRAPARE - Administrator, Civil Service (Medical): No    Lack of Transportation (Non-Medical): No  Physical Activity: Inactive (02/15/2024)   Exercise Vital Sign    Days of Exercise per Week: 0 days    Minutes of Exercise per Session: Not on file  Stress: Stress Concern Present (02/15/2024)   [employer] of Occupational Health - Occupational Stress Questionnaire    Feeling of Stress: To some extent  Social Connections: Unknown (02/15/2024)   Social Connection and Isolation Panel    Frequency of Communication with Friends and Family: Once a week    Frequency of Social Gatherings with Friends and Family: More than three times a week    Attends Religious Services: Patient declined    Database Administrator or Organizations: Patient declined    Attends Banker Meetings: Not on file    Marital Status: Married  Intimate Partner Violence: Not At Risk (07/04/2023)   Humiliation, Afraid, Rape, and Kick questionnaire    Fear of Current or Ex-Partner: No    Emotionally Abused: No    Physically Abused: No    Sexually Abused: No    FAMILY HISTORY: Family History  Problem Relation Age of Onset   Lung cancer Mother        lung   Esophageal cancer Cousin    Colon cancer Neg Hx    Rectal  cancer Neg Hx    Stomach cancer Neg Hx     ALLERGIES:  has no known allergies.  MEDICATIONS:  Current Outpatient Medications  Medication Sig Dispense Refill  acetaminophen  (TYLENOL ) 325 MG tablet Take 650 mg by mouth every 6 (six) hours as needed (for pain).     amLODipine  (NORVASC ) 5 MG tablet TAKE 1 TABLET (5 MG TOTAL) BY MOUTH DAILY. 90 tablet 1   aspirin  EC 81 MG tablet Take 81 mg by mouth in the morning.     carvedilol  (COREG ) 3.125 MG tablet TAKE 1 TABLET BY MOUTH TWICE A DAY WITH FOOD 180 tablet 1   cyanocobalamin  (VITAMIN B12) 1000 MCG tablet Take 1 tablet (1,000 mcg total) by mouth daily. 30 tablet 6   diazepam  (VALIUM ) 5 MG tablet TAKE 1 TABLET BY MOUTH EVERY 12 HOURS AS NEEDED FOR ANXIETY. 60 tablet 5   famotidine  (PEPCID ) 20 MG tablet Take 1 tablet (20 mg total) by mouth 2 (two) times daily for 6 days. (Patient taking differently: Take 20 mg by mouth 2 (two) times daily. As needed) 12 tablet 0   ferrous sulfate  325 (65 FE) MG tablet Take 1 tablet (325 mg total) by mouth daily with breakfast. Please take with a source of Vitamin C 90 tablet 3   furosemide  (LASIX ) 20 MG tablet TAKE 3 TABLETS BY MOUTH EVERY MORNING AND TAKE 2 TABLETS BY MOUTH EVERY EVENING 150 tablet 3   ipratropium (ATROVENT ) 0.03 % nasal spray Place 2 sprays into both nostrils every 12 (twelve) hours as needed for rhinitis.     lidocaine -prilocaine  (EMLA ) cream Apply topically once a week. 30 g 0   Magnesium  500 MG TABS Take 500 mg by mouth every morning.     meclizine  (ANTIVERT ) 25 MG tablet Take 1 tablet (25 mg total) by mouth 3 (three) times daily as needed for dizziness. 30 tablet 0   mirtazapine (REMERON) 7.5 MG tablet TAKE 1 TABLET BY MOUTH AT BEDTIME. 90 tablet 1   multivitamin-iron-minerals-folic acid  (CENTRUM) chewable tablet Chew 1 tablet by mouth daily.     nitroGLYCERIN  (NITROSTAT ) 0.4 MG SL tablet Place 0.4 mg under the tongue every 5 (five) minutes as needed for chest pain.     ondansetron  (ZOFRAN )  8 MG tablet Take 1 tablet (8 mg total) by mouth every 8 (eight) hours as needed. 30 tablet 0   pantoprazole  (PROTONIX ) 40 MG tablet TAKE 1 TABLET (40 MG TOTAL) BY MOUTH TWICE A DAY BEFORE MEALS 180 tablet 1   potassium chloride  SA (KLOR-CON  M20) 20 MEQ tablet Take 1 tablet (20 mEq total) by mouth daily. (Patient taking differently: Take 20 mEq by mouth 2 (two) times daily.)     prochlorperazine  (COMPAZINE ) 10 MG tablet Take 1 tablet (10 mg total) by mouth every 6 (six) hours as needed for nausea or vomiting. 30 tablet 0   rosuvastatin  (CRESTOR ) 20 MG tablet Take 1 tablet (20 mg total) by mouth daily. 90 tablet 3   temazepam  (RESTORIL ) 30 MG capsule Take 1 capsule (30 mg total) by mouth at bedtime as needed for sleep. 90 capsule 1   traMADol  (ULTRAM ) 50 MG tablet Take 1 tablet (50 mg total) by mouth every 6 (six) hours as needed. 30 tablet 0   venlafaxine  XR (EFFEXOR -XR) 150 MG 24 hr capsule Take 1 capsule (150 mg total) by mouth daily with breakfast. 90 capsule 3   No current facility-administered medications for this visit.    REVIEW OF SYSTEMS:   Constitutional: ( - ) fevers, ( - )  chills , ( - ) night sweats Eyes: ( - ) blurriness of vision, ( - ) double vision, ( - ) watery eyes Ears, nose,  mouth, throat, and face: ( - ) mucositis, ( - ) sore throat Respiratory: ( - ) cough, ( - ) dyspnea, ( - ) wheezes Cardiovascular: ( - ) palpitation, ( - ) chest discomfort, ( - ) lower extremity swelling Gastrointestinal:  ( - ) nausea, ( - ) heartburn, ( - ) change in bowel habits Skin: ( - ) abnormal skin rashes Lymphatics: ( - ) new lymphadenopathy, ( - ) easy bruising Neurological: ( - ) numbness, ( - ) tingling, ( - ) new weaknesses Behavioral/Psych: ( - ) mood change, ( - ) new changes  All other systems were reviewed with the patient and are negative.  PHYSICAL EXAMINATION: ECOG PERFORMANCE STATUS: 1 - Symptomatic but completely ambulatory  Vitals:   03/07/24 1201  BP: 117/60  Pulse: 69   Resp: 19  Temp: 98.4 F (36.9 C)  SpO2: 100%       Filed Weights   03/07/24 1201  Weight: 219 lb 9.6 oz (99.6 kg)        GENERAL: well appearing elderly Caucasian male in NAD  SKIN: skin color, texture, turgor are normal, no rashes or significant lesions EYES: conjunctiva are pink and non-injected, sclera clear LUNGS: clear to auscultation and percussion with normal breathing effort HEART: regular rate & rhythm and no murmurs and no lower extremity edema Musculoskeletal: no cyanosis of digits and no clubbing.  PSYCH: alert & oriented x 3, fluent speech NEURO: no focal motor/sensory deficits  LABORATORY DATA:  I have reviewed the data as listed    Latest Ref Rng & Units 03/07/2024   11:47 AM 02/29/2024   12:39 PM 02/22/2024    8:24 AM  CBC  WBC 4.0 - 10.5 K/uL 6.4  6.5  6.5   Hemoglobin 13.0 - 17.0 g/dL 7.7  8.0  8.5   Hematocrit 39.0 - 52.0 % 23.8  25.0  26.9   Platelets 150 - 400 K/uL 165  180  147        Latest Ref Rng & Units 03/07/2024   11:47 AM 02/29/2024   12:39 PM 02/22/2024    8:24 AM  CMP  Glucose 70 - 99 mg/dL 821  823  795   BUN 8 - 23 mg/dL 15  22  21    Creatinine 0.61 - 1.24 mg/dL 8.12  7.98  7.96   Sodium 135 - 145 mmol/L 134  134  135   Potassium 3.5 - 5.1 mmol/L 4.1  4.2  4.2   Chloride 98 - 111 mmol/L 103  102  104   CO2 22 - 32 mmol/L 24  25  24    Calcium  8.9 - 10.3 mg/dL 8.6  8.6    8.3  8.1   Total Protein 6.5 - 8.1 g/dL 7.3  7.4  6.9   Total Bilirubin 0.0 - 1.2 mg/dL 0.7  0.7  0.9   Alkaline Phos 38 - 126 U/L 114  137  122   AST 15 - 41 U/L 22  25  22    ALT 0 - 44 U/L 13  23  16      RADIOGRAPHIC STUDIES: IR NEPHROSTOMY EXCHANGE BILATERAL Result Date: 02/21/2024 INDICATION: Bladder cancer and bilateral nephrostomy tubes. Patient presents for routine exchange. EXAM: EXCHANGE OF BILATERAL NEPHROSTOMY TUBES WITH FLUOROSCOPY Physician: Juliene SAUNDERS. Philip, MD COMPARISON:  None Available. MEDICATIONS: 1% lidocaine  for local anesthetic  ANESTHESIA/SEDATION: None CONTRAST:  25 mL OMNIPAQUE  IOHEXOL  300 MG/ML SOLN - administered into the collecting system(s) FLUOROSCOPY: Radiation Exposure Index (as provided  by the fluoroscopic device): 21 mGy Kerma COMPLICATIONS: None immediate. PROCEDURE: The procedure was explained to the patient. The risks and benefits of the procedure were discussed and the patient's questions were addressed. Informed consent was obtained from the patient. Patient was placed prone. Both flanks were prepped and draped in sterile fashion. Maximal barrier sterile technique was utilized including caps, mask, sterile gowns, sterile gloves, sterile drape, hand hygiene and skin antiseptic. Contrast was injected through the right nephrostomy tube. Nephrostomy tube was cut and removed over a wire. New 10 French multipurpose drain was advanced over the wire and reconstituted in the renal pelvis. Contrast injection confirmed placement in the renal pelvis. Skin was anesthetized with 1% lidocaine . Right nephrostomy tube was sutured to skin and attached to a gravity bag. Contrast was injected through the left nephrostomy tube. Nephrostomy tube was cut and removed over a wire. New 10 French multipurpose drain was advanced over the wire and reconstituted in the renal pelvis. Contrast injection confirmed placement in the renal pelvis. Skin was anesthetized with 1% lidocaine . Left nephrostomy tube was sutured to skin and attached to a gravity bag. Fluoroscopic images were taken and saved for this procedure. FINDINGS: Left nephrostomy tube is reconstituted in left renal pelvis. Right nephrostomy tube is reconstituted in the right renal pelvis. IMPRESSION: Successful exchange of bilateral nephrostomy tubes with fluoroscopy. Electronically Signed   By: Juliene Balder M.D.   On: 02/21/2024 14:39     ASSESSMENT & PLAN Luis [name] 68 y.o. male with medical history significant for BCG unresponsive high risk nonmuscle invasive bladder cancer who  presents to establish care.  After review of the labs, review of the records, and discussion with the patient the patients findings are most consistent with BCG unresponsive high risk non-muscle invasive bladder cancer, not a candidate for cystectomy.  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 21 days until progression or intolerance up to 24 months. --Received 8 cycles of Pembrolizumab  on 08/12/2022-01/13/2023: Cycle 8 of Pembrolizumab   --On 03/03/2023, underwent Cystectomy/Prostatectomy showed infiltrative high-grade urothelial carcinoma, size 5.8 cm involving bladder dome, anterior wall and prostatic stroma. Tumor invades directly into prostatic stroma at apex and mid portion of the gland (pT4a). Metastatic urothelial cancer in one of two left common iliac lymph nodes.  -- Given the results of the pathology showing positive margins and spread to the lymph node we are considering radiation therapy versus chemotherapy.  For chemotherapy, recommend gemcitabine  and cisplatin x 4 cycles.  This would consist of gemcitabine  1000 mg/m on days 1 and 8 and cisplatin 70 mg/m on day 1 of 21-day cycle for 4 cycles. -- Due to kidney dysfunction and anemia, chemotherapy was deferred and patient underwent adjuvant radiation to the surgical bed from 06/19/2023 until 07/27/2023.  He received 50.4 Gray in 28 fractions. PLAN:  --Today is Cycle 5, Day 15 of Enfortumab therapy --Labs from today show white blood cell 6.4, hemoglobin 7.7, MCV 91.5, platelets 165 --Due for CT CAP in Dec 2025. CT scan on 01/22/2024 showed response to treatment with no evidence of progression.  --Proceed with treatment today without any dose modifications.  --RTC weekly for treatment and every 2 weeks for clinic visit.  # Skin Rash -- Occurred after receiving blood transfusion, etiology not entirely clear. -- Patient has been  applying moisturizer to the skin and has been taking Benadryl . -- Skin is persistently dry and itchy and  erythematous. -- Completed steroid taper with 60 mg for 5 days, 40 for 5 days, and 20 for 5 days.  #Anemia --Hemoglobin 7.7 --May be multifactorial due to nutritional deficiency as well as kidney dysfunction. -- Nutritional labs show borderline vitamin B12 deficiency and iron deficiency. -- Patient is currently taking vitamin B12 p.o. daily and ferrous sulfate  every other day. -- Receives vitamin B12 1000 mcg injection monthly, last received on 01/04/2024. -- Last received IV monoferric  1000 mg x 1 dose on 08/25/2023.  -- Will recheck vitamin B12 and iron levels are next visit.   #Hypokalemia: --Potassium level is 4.1 today --Patient is currently taking potassium supplement 20 meq BID. Advised to continue  #Flank pain--improved: --Secondary to bilateral nephrostomy. Patient is currently taking tylenol  with minimal improvement.  --Has prescription for tramadol  50 mg PO q 6 hours as needed.   # AKI -- Currently has bilateral nephrostomies, underwent exchange on yesterday, 01/10/2024. --monitoring Cr at each visit.   #Supportive Care -- chemotherapy education complete -- port placed -- zofran  8mg  q8H PRN and compazine  10mg  PO q6H for nausea -- EMLA  cream for port -- no pain medication required at this time.    No orders of the defined types were placed in this encounter.  All questions were answered. The patient knows to call the clinic with any problems, questions or concerns.  I have spent a total of 30 minutes minutes of face-to-face and non-face-to-face time, preparing to see the patient, performing a medically appropriate examination, counseling and educating the patient, documenting clinical information in the electronic health record,and care coordination.   Norleen IVAR Kidney, MD Department of Hematology/Oncology [facility] Phone:  [phone] Pager: [phone] Email: [email or web address]

## 2024-03-08 LAB — TYPE AND SCREEN
ABO/RH(D): A NEG
Antibody Screen: NEGATIVE
Unit division: 0

## 2024-03-08 LAB — BPAM RBC
Blood Product Expiration Date: 202511212359
ISSUE DATE / TIME: 202511131350
Unit Type and Rh: 600

## 2024-03-10 ENCOUNTER — Encounter: Payer: Self-pay | Admitting: Hematology and Oncology

## 2024-03-20 ENCOUNTER — Inpatient Hospital Stay

## 2024-03-20 ENCOUNTER — Inpatient Hospital Stay (HOSPITAL_BASED_OUTPATIENT_CLINIC_OR_DEPARTMENT_OTHER): Admitting: Hematology and Oncology

## 2024-03-20 ENCOUNTER — Encounter: Payer: Self-pay | Admitting: Hematology and Oncology

## 2024-03-20 VITALS — BP 124/66 | HR 88 | Temp 98.4°F | Resp 16

## 2024-03-20 VITALS — BP 118/56 | HR 56 | Temp 97.7°F | Resp 14 | Wt 231.3 lb

## 2024-03-20 DIAGNOSIS — D508 Other iron deficiency anemias: Secondary | ICD-10-CM

## 2024-03-20 DIAGNOSIS — Z5112 Encounter for antineoplastic immunotherapy: Secondary | ICD-10-CM | POA: Diagnosis not present

## 2024-03-20 DIAGNOSIS — C679 Malignant neoplasm of bladder, unspecified: Secondary | ICD-10-CM | POA: Diagnosis not present

## 2024-03-20 DIAGNOSIS — Z95828 Presence of other vascular implants and grafts: Secondary | ICD-10-CM

## 2024-03-20 LAB — CBC WITH DIFFERENTIAL (CANCER CENTER ONLY)
Abs Immature Granulocytes: 0.02 K/uL (ref 0.00–0.07)
Basophils Absolute: 0 K/uL (ref 0.0–0.1)
Basophils Relative: 0 %
Eosinophils Absolute: 0.1 K/uL (ref 0.0–0.5)
Eosinophils Relative: 1 %
HCT: 27.7 % — ABNORMAL LOW (ref 39.0–52.0)
Hemoglobin: 8.6 g/dL — ABNORMAL LOW (ref 13.0–17.0)
Immature Granulocytes: 0 %
Lymphocytes Relative: 12 %
Lymphs Abs: 0.6 K/uL — ABNORMAL LOW (ref 0.7–4.0)
MCH: 29.7 pg (ref 26.0–34.0)
MCHC: 31 g/dL (ref 30.0–36.0)
MCV: 95.5 fL (ref 80.0–100.0)
Monocytes Absolute: 0.5 K/uL (ref 0.1–1.0)
Monocytes Relative: 10 %
Neutro Abs: 3.7 K/uL (ref 1.7–7.7)
Neutrophils Relative %: 77 %
Platelet Count: 123 K/uL — ABNORMAL LOW (ref 150–400)
RBC: 2.9 MIL/uL — ABNORMAL LOW (ref 4.22–5.81)
RDW: 20.1 % — ABNORMAL HIGH (ref 11.5–15.5)
WBC Count: 4.9 K/uL (ref 4.0–10.5)
nRBC: 0 % (ref 0.0–0.2)

## 2024-03-20 LAB — CMP (CANCER CENTER ONLY)
ALT: 16 U/L (ref 0–44)
AST: 25 U/L (ref 15–41)
Albumin: 3.5 g/dL (ref 3.5–5.0)
Alkaline Phosphatase: 119 U/L (ref 38–126)
Anion gap: 8 (ref 5–15)
BUN: 23 mg/dL (ref 8–23)
CO2: 25 mmol/L (ref 22–32)
Calcium: 8.7 mg/dL — ABNORMAL LOW (ref 8.9–10.3)
Chloride: 104 mmol/L (ref 98–111)
Creatinine: 1.82 mg/dL — ABNORMAL HIGH (ref 0.61–1.24)
GFR, Estimated: 39 mL/min — ABNORMAL LOW (ref >60)
Glucose, Bld: 113 mg/dL — ABNORMAL HIGH (ref 70–99)
Potassium: 4.5 mmol/L (ref 3.5–5.1)
Sodium: 137 mmol/L (ref 135–145)
Total Bilirubin: 0.5 mg/dL (ref 0.0–1.2)
Total Protein: 7.1 g/dL (ref 6.5–8.1)

## 2024-03-20 MED ORDER — PROCHLORPERAZINE MALEATE 10 MG PO TABS
10.0000 mg | ORAL_TABLET | Freq: Once | ORAL | Status: AC
  Administered 2024-03-20: 10 mg via ORAL
  Filled 2024-03-20: qty 1

## 2024-03-20 MED ORDER — PROCHLORPERAZINE MALEATE 10 MG PO TABS: 10.0000 mg | ORAL_TABLET | Freq: Once | ORAL | Status: DC

## 2024-03-20 MED ORDER — SODIUM CHLORIDE 0.9 % IV SOLN: 125.0000 mg | Freq: Once | INTRAVENOUS | Status: DC

## 2024-03-20 MED ORDER — SODIUM CHLORIDE 0.9 % IV SOLN
125.0000 mg | Freq: Once | INTRAVENOUS | Status: AC
  Administered 2024-03-20: 125 mg via INTRAVENOUS
  Filled 2024-03-20: qty 9

## 2024-03-20 MED ORDER — SODIUM CHLORIDE 0.9 % IV SOLN: INTRAVENOUS | Status: DC

## 2024-03-20 NOTE — Patient Instructions (Signed)
 [facility] - A DEPT OF [facility]  [facility]  Discharge Instructions: Thank you for choosing [facility] to provide your oncology and hematology care.   If you have a lab appointment with the Cancer Center, please go directly to the Cancer Center and check in at the registration area.   Wear comfortable clothing and clothing appropriate for easy access to any Portacath or PICC line.   We strive to give you quality time with your provider. You may need to reschedule your appointment if you arrive late (15 or more minutes).  Arriving late affects you and other patients whose appointments are after yours.  Also, if you miss three or more appointments without notifying the office, you may be dismissed from the clinic at the provider's discretion.      For prescription refill requests, have your pharmacy contact our office and allow 72 hours for refills to be completed.    Today you received the following chemotherapy and/or immunotherapy agents:  enfortumab vedotin -ejfv (PADCEV )    To help prevent nausea and vomiting after your treatment, we encourage you to take your nausea medication as directed.  BELOW ARE SYMPTOMS THAT SHOULD BE REPORTED IMMEDIATELY: *FEVER GREATER THAN 100.4 F (38 C) OR HIGHER *CHILLS OR SWEATING *NAUSEA AND VOMITING THAT IS NOT CONTROLLED WITH YOUR NAUSEA MEDICATION *UNUSUAL SHORTNESS OF BREATH *UNUSUAL BRUISING OR BLEEDING *URINARY PROBLEMS (pain or burning when urinating, or frequent urination) *BOWEL PROBLEMS (unusual diarrhea, constipation, pain near the anus) TENDERNESS IN MOUTH AND THROAT WITH OR WITHOUT PRESENCE OF ULCERS (sore throat, sores in mouth, or a toothache) UNUSUAL RASH, SWELLING OR PAIN  UNUSUAL VAGINAL DISCHARGE OR ITCHING   Items with * indicate a potential emergency and should be followed up as soon as possible or go to the Emergency Department if any problems should occur.  Please show the CHEMOTHERAPY ALERT  CARD or IMMUNOTHERAPY ALERT CARD at check-in to the Emergency Department and triage nurse.  Should you have questions after your visit or need to cancel or reschedule your appointment, please contact [facility]  Dept: [phone]  and follow the prompts.  Office hours are 8:00 a.m. to 4:30 p.m. Monday - Friday. Please note that voicemails left after 4:00 p.m. may not be returned until the following business day.  We are closed weekends and major holidays. You have access to a nurse at all times for urgent questions. Please call the main number to the clinic Dept: [phone] and follow the prompts.   For any non-urgent questions, you may also contact your provider using MyChart. We now offer e-Visits for anyone 24 and older to request care online for non-urgent symptoms. For details visit [email or web address].   Also download the MyChart app! Go to the app store, search MyChart, open the app, select [facility], and log in with your MyChart username and password.

## 2024-03-20 NOTE — Progress Notes (Signed)
 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 Federico  08/12/2022: Cycle 1 of Pembrolizumab  09/02/2022: Cycle 2 of Pembrolizumab   09/23/2022: Cycle 3 of Pembrolizumab  10/14/2022: Cycle 4 of Pembrolizumab  11/04/2022: Cycle 5 of Pembrolizumab  11/25/2022: Cycle 6 of Pembrolizumab  12/20/2022: Cycle 7 of Pembrolizumab  01/13/2023: Cycle 8 of Pembrolizumab   03/03/2023: Cystectomy/Prostatectomy showed infiltrative high-grade urothelial carcinoma, size 5.8 cm involving bladder dome, anterior wall and prostatic stroma. Tumor invades directly into prostatic stroma at apex and mid portion of the gland (pT4a). Metastatic urothelial cancer in one of two left common iliac lymph nodes.  06/19/2023-07/27/2023: Received adjuvant radiation to surgical bed.  He received 50.4 Gray in 28 fractions. 11/02/2023: Cycle 1 Day 1 of Enfortumab.  11/29/2023:  Cycle 2 Day 1 of Enfortumab.  12/28/2023: Cycle 3 Day 1 of Enfortumab.   CHIEF COMPLAINTS/PURPOSE OF CONSULTATION:  High Risk Non-Muscle Invasive Bladder Cancer   HISTORY OF PRESENTING ILLNESS:  Luis [name] 68 y.o. male with medical  history significant for BCG unresponsive high risk nonmuscle invasive bladder cancer who presents for a follow up visit.  He was last seen on 02/22/2024.  He presents today to continue Enfortumab treatment.   On exam today Luis [name] reports feels considerably better after his brief chemotherapy holiday.  He reports he will be hosting Thanksgiving at his home and is grateful that his appetite is improving.  He has gained about 4 pounds from his last visit 1 month ago.  He notes that he feels like his energy levels are improved but he remains sluggish.  He is not having any nausea, vomiting, or diarrhea.  He denies any runny nose, sore throat, cough.  He is having no fevers, chills, sweats.  He is having some occasional nosebleeds but he has not had several over the last days.  His nephrostomy tubes are functioning well with no bleeding or clogs.  He reports nothing else out of the ordinary.  A full 10 point ROS is otherwise negative.  He is willing and able to proceed with treatment today.  MEDICAL HISTORY:  Past Medical History:  Diagnosis Date   Anxiety    takes Valium  as needed   Aortic stenosis, moderate    Arthritis    Ascending aortic aneurysm    CAD (coronary artery disease)    a. s/p PCI of the RCA 8/12 with DES by Dr Wonda, preserved EF. b. LHC/RHC (2/16) with mean RA 12, PA 32/15, mean PCWP 18, CI 3.47; patent mid and distal RCA stents, 50-60% proximal stenosis small PDA.      Cancer [facility])    bladder   Carotid stenosis    a. Carotid US  (05/2013):  Bilateral 1-39% ICA; L thyroid  nodule (prior hx of aspiration).   Chronic diastolic CHF (congestive heart failure) (HCC)  Complication of anesthesia    difficulty waking up after gallbladder surgery   Depression    Dyspnea    Essential hypertension    GERD (gastroesophageal reflux disease)    if needed will take OTC meds    Heart murmur    History of colonic polyps    hyperplastic   Hyperlipidemia    Joint pain    Lesion of  bladder    Myocardial infarction (HCC) 2012   Obesity (BMI 30-39.9) 02/29/2016   Pre-diabetes    Restless leg    Sleep apnea    uses cpap   Tubular adenoma of colon    Vertigo    takes Meclizine  as needed    SURGICAL HISTORY: Past Surgical History:  Procedure Laterality Date   AORTIC VALVE REPLACEMENT N/A 09/16/2021   Procedure: AORTIC VALVE REPLACEMENT (AVR);  Surgeon: Lucas Dorise POUR, MD;  Location: [facility] OR;  Service: Open Heart Surgery;  Laterality: N/A;   CATARACT EXTRACTION   4 YRS AGO   BOTH EYES   CHOLECYSTECTOMY  07/21/2011   Procedure: LAPAROSCOPIC CHOLECYSTECTOMY WITH INTRAOPERATIVE CHOLANGIOGRAM;  Surgeon: Alm VEAR Angle, MD;  Location: [facility] ORS;  Service: General;  Laterality: N/A;   CORONARY ANGIOPLASTY  2012   2 stents   coronary stenting     s/p PCI of the RCA by Dr Wonda 8/12 with 2 promus stents   CYSTOSCOPY W/ RETROGRADES Bilateral 12/13/2019   Procedure: CYSTOSCOPY WITH RETROGRADE PYELOGRAM;  Surgeon: Alvaro Hummer, MD;  Location: [facility];  Service: Urology;  Laterality: Bilateral;   CYSTOSCOPY W/ RETROGRADES Bilateral 10/14/2020   Procedure: CYSTOSCOPY WITH RETROGRADE PYELOGRAM;  Surgeon: Alvaro Hummer, MD;  Location: [facility];  Service: Urology;  Laterality: Bilateral;   CYSTOSCOPY W/ RETROGRADES Bilateral 12/16/2020   Procedure: CYSTOSCOPY WITH RETROGRADE PYELOGRAM;  Surgeon: Alvaro Hummer, MD;  Location: [facility];  Service: Urology;  Laterality: Bilateral;   CYSTOSCOPY W/ RETROGRADES Bilateral 06/03/2022   Procedure: CYSTOSCOPY WITH RETROGRADE PYELOGRAM;  Surgeon: Alvaro Hummer, MD;  Location: [facility];  Service: Urology;  Laterality: Bilateral;   CYSTOSCOPY W/ RETROGRADES Bilateral 12/28/2022   Procedure: CYSTOSCOPY WITH RETROGRADE PYELOGRAM, FULGARATION OF BLEEDERS;  Surgeon: Alvaro Hummer KATHEE Mickey., MD;  Location: [facility];  Service: Urology;  Laterality: Bilateral;   CYSTOSCOPY WITH INJECTION N/A 03/03/2023    Procedure: CYSTOSCOPY WITH INDOCYANINE INJECTION;  Surgeon: Alvaro Hummer KATHEE Mickey., MD;  Location: [facility];  Service: Urology;  Laterality: N/A;  360 MINUTES   ESOPHAGOGASTRODUODENOSCOPY N/A 06/28/2023   Procedure: EGD (ESOPHAGOGASTRODUODENOSCOPY);  Surgeon: Charlanne Groom, MD;  Location: [facility];  Service: Gastroenterology;  Laterality: N/A;   IR IMAGING GUIDED PORT INSERTION  05/29/2023   IR NEPHROSTOMY EXCHANGE LEFT  08/07/2023   IR NEPHROSTOMY EXCHANGE LEFT  10/02/2023   IR NEPHROSTOMY EXCHANGE LEFT  11/27/2023   IR NEPHROSTOMY EXCHANGE LEFT  01/10/2024   IR NEPHROSTOMY EXCHANGE LEFT  02/21/2024   IR NEPHROSTOMY PLACEMENT RIGHT  07/01/2023   IR RADIOLOGY PERIPHERAL GUIDED IV START  07/01/2023   IR US  GUIDE VASC ACCESS LEFT  07/01/2023   LEFT AND RIGHT HEART CATHETERIZATION WITH CORONARY ANGIOGRAM N/A 06/23/2014   Procedure: LEFT AND RIGHT HEART CATHETERIZATION WITH CORONARY ANGIOGRAM;  Surgeon: Ezra GORMAN Shuck, MD;  Location: [facility] CATH LAB;  Service: Cardiovascular;  Laterality: N/A;   NECK SURGERY  03/23/09   per Dr. Barbarann, cervical fusion    PERICARDIOCENTESIS N/A 09/28/2021   Procedure: PERICARDIOCENTESIS;  Surgeon: Dann Candyce GORMAN, MD;  Location:  [facility] INVASIVE CV LAB;  Service: Cardiovascular;  Laterality: N/A;   REPLACEMENT ASCENDING AORTA N/A 09/16/2021   Procedure: REPLACEMENT ASCENDING AORTA WITH 30 X HEMASHIELD PLATINUM WOVEN DOUBLE VELOUR VASCULAR GRAFT;  Surgeon: Lucas Dorise POUR, MD;  Location: [facility] OR;  Service: Open Heart Surgery;  Laterality: N/A;  CIRC ARREST   right elbow surgery     RIGHT HEART CATH N/A 06/15/2023   Procedure: RIGHT HEART CATH;  Surgeon: Rolan Ezra RAMAN, MD;  Location: [facility] INVASIVE CV LAB;  Service: Cardiovascular;  Laterality: N/A;   RIGHT HEART CATH AND CORONARY ANGIOGRAPHY N/A 07/08/2021   Procedure: RIGHT HEART CATH AND CORONARY ANGIOGRAPHY;  Surgeon: Rolan Ezra RAMAN, MD;  Location: [facility] INVASIVE CV LAB;  Service: Cardiovascular;  Laterality: N/A;   ROBOT ASSISTED  LAPAROSCOPIC COMPLETE CYSTECT ILEAL CONDUIT N/A 03/03/2023   Procedure: XI ROBOTIC ASSISTED LAPAROSCOPIC COMPLETE CYSTECTECTOMY WITH  ILEAL CONDUIT DIVERSION;  Surgeon: Alvaro Ricardo KATHEE Mickey., MD;  Location: [facility];  Service: Urology;  Laterality: N/A;   ROBOT ASSISTED LAPAROSCOPIC RADICAL PROSTATECTOMY N/A 03/03/2023   Procedure: XI ROBOTIC ASSISTED LAPAROSCOPIC RADICAL PROSTATECTOMY WITH LYMPH NODE DISSECTION;  Surgeon: Alvaro Ricardo KATHEE Mickey., MD;  Location: [facility];  Service: Urology;  Laterality: N/A;   solonscopy  05/23/08   per Dr. Jakie inch hemorrhoids only, repeat in 5 years   SUBXYPHOID PERICARDIAL WINDOW N/A 09/28/2021   Procedure: SUBXYPHOID PERICARDIAL WINDOW;  Surgeon: Lucas Dorise POUR, MD;  Location: [facility] OR;  Service: Thoracic;  Laterality: N/A;   TEE WITHOUT CARDIOVERSION N/A 06/23/2014   Procedure: TRANSESOPHAGEAL ECHOCARDIOGRAM (TEE);  Surgeon: Ezra RAMAN Rolan, MD;  Location: [facility] ENDOSCOPY;  Service: Cardiovascular;  Laterality: N/A;   TEE WITHOUT CARDIOVERSION N/A 01/21/2016   Procedure: TRANSESOPHAGEAL ECHOCARDIOGRAM (TEE);  Surgeon: Ezra RAMAN Rolan, MD;  Location: [facility] ENDOSCOPY;  Service: Cardiovascular;  Laterality: N/A;   TEE WITHOUT CARDIOVERSION N/A 09/16/2021   Procedure: TRANSESOPHAGEAL ECHOCARDIOGRAM (TEE);  Surgeon: Lucas Dorise POUR, MD;  Location: [facility] OR;  Service: Open Heart Surgery;  Laterality: N/A;   TEE WITHOUT CARDIOVERSION N/A 09/28/2021   Procedure: TRANSESOPHAGEAL ECHOCARDIOGRAM (TEE);  Surgeon: Lucas Dorise POUR, MD;  Location: [facility] OR;  Service: Thoracic;  Laterality: N/A;   TONSILLECTOMY     TRANSESOPHAGEAL ECHOCARDIOGRAM (CATH LAB) N/A 04/17/2023   Procedure: TRANSESOPHAGEAL ECHOCARDIOGRAM;  Surgeon: Jeffrie Oneil BROCKS, MD;  Location: [facility] INVASIVE CV LAB;  Service: Cardiovascular;  Laterality: N/A;   TRANSURETHRAL RESECTION OF BLADDER TUMOR N/A 10/21/2019   Procedure: TRANSURETHRAL RESECTION OF BLADDER TUMOR (TURBT);  Surgeon: Ottelin, Mark, MD;  Location: [facility];  Service: Urology;  Laterality: N/A;   TRANSURETHRAL RESECTION OF BLADDER TUMOR N/A 12/13/2019   Procedure: TRANSURETHRAL RESECTION OF BLADDER TUMOR (TURBT);  Surgeon: Alvaro Ricardo, MD;  Location: [facility];  Service: Urology;  Laterality: N/A;  1 HR   TRANSURETHRAL RESECTION OF BLADDER TUMOR N/A 10/14/2020   Procedure: TRANSURETHRAL RESECTION OF BLADDER TUMOR (TURBT);  Surgeon: Alvaro Ricardo, MD;  Location: [facility];  Service: Urology;  Laterality: N/A;   TRANSURETHRAL RESECTION OF BLADDER TUMOR N/A 12/16/2020   Procedure: RESTAGING TRANSURETHRAL RESECTION OF BLADDER TUMOR (TURBT);  Surgeon: Alvaro Ricardo, MD;  Location: [facility];  Service: Urology;  Laterality: N/A;   TRANSURETHRAL RESECTION OF BLADDER TUMOR N/A 06/03/2022   Procedure: TRANSURETHRAL RESECTION OF BLADDER TUMOR (TURBT);  Surgeon: Alvaro Ricardo, MD;  Location: [facility];  Service: Urology;  Laterality: N/A;   UMBILICAL HERNIA REPAIR  03/03/2023   Procedure: HERNIA  REPAIR UMBILICAL;  Surgeon: Alvaro Ricardo KATHEE Mickey., MD;  Location: [facility];  Service: Urology;;    SOCIAL HISTORY: Social History   Socioeconomic History   Marital status: Married    Spouse name: Not on file   Number of children: 4   Years of education: Not on file   Highest education level: Not on file  Occupational History   Occupation: Public House Manager: [employer]  Tobacco Use   Smoking status: Former    Current packs/day: 0.00    Average packs/day: 2.0 packs/day for 40.0 years (80.0 ttl pk-yrs)    Types: Cigarettes    Start date: 21    Quit date: 2012    Years since quitting: 13.9    Passive exposure: Never   Smokeless tobacco: Never  Vaping Use   Vaping status: Never Used  Substance and Sexual Activity   Alcohol use: No    Alcohol/week: 0.0 standard drinks of alcohol   Drug use: No   Sexual activity: Not on file  Other Topics Concern   Not on file   Social History Narrative   Not on file   Social Drivers of Health   Financial Resource Strain: Low Risk  (02/15/2024)   Overall Financial Resource Strain (CARDIA)    Difficulty of Paying Living Expenses: Not hard at all  Food Insecurity: No Food Insecurity (02/15/2024)   Hunger Vital Sign    Worried About Running Out of Food in the Last Year: Never true    Ran Out of Food in the Last Year: Never true  Transportation Needs: No Transportation Needs (02/15/2024)   PRAPARE - Administrator, Civil Service (Medical): No    Lack of Transportation (Non-Medical): No  Physical Activity: Inactive (02/15/2024)   Exercise Vital Sign    Days of Exercise per Week: 0 days    Minutes of Exercise per Session: Not on file  Stress: Stress Concern Present (02/15/2024)   [employer] of Occupational Health - Occupational Stress Questionnaire    Feeling of Stress: To some extent  Social Connections: Unknown (02/15/2024)   Social Connection and Isolation Panel    Frequency of Communication with Friends and Family: Once a week    Frequency of Social Gatherings with Friends and Family: More than three times a week    Attends Religious Services: Patient declined    Database Administrator or Organizations: Patient declined    Attends Banker Meetings: Not on file    Marital Status: Married  Intimate Partner Violence: Not At Risk (07/04/2023)   Humiliation, Afraid, Rape, and Kick questionnaire    Fear of Current or Ex-Partner: No    Emotionally Abused: No    Physically Abused: No    Sexually Abused: No    FAMILY HISTORY: Family History  Problem Relation Age of Onset   Lung cancer Mother        lung   Esophageal cancer Cousin    Colon cancer Neg Hx    Rectal cancer Neg Hx    Stomach cancer Neg Hx     ALLERGIES:  has no known allergies.  MEDICATIONS:  Current Outpatient Medications  Medication Sig Dispense Refill   acetaminophen  (TYLENOL ) 325 MG tablet Take 650  mg by mouth every 6 (six) hours as needed (for pain).     amLODipine  (NORVASC ) 5 MG tablet TAKE 1 TABLET (5 MG TOTAL) BY MOUTH DAILY. 90 tablet 1   aspirin  EC 81 MG  tablet Take 81 mg by mouth in the morning.     carvedilol  (COREG ) 3.125 MG tablet TAKE 1 TABLET BY MOUTH TWICE A DAY WITH FOOD 180 tablet 1   cyanocobalamin  (VITAMIN B12) 1000 MCG tablet Take 1 tablet (1,000 mcg total) by mouth daily. 30 tablet 6   diazepam  (VALIUM ) 5 MG tablet TAKE 1 TABLET BY MOUTH EVERY 12 HOURS AS NEEDED FOR ANXIETY. 60 tablet 5   famotidine  (PEPCID ) 20 MG tablet Take 1 tablet (20 mg total) by mouth 2 (two) times daily for 6 days. (Patient taking differently: Take 20 mg by mouth 2 (two) times daily. As needed) 12 tablet 0   ferrous sulfate  325 (65 FE) MG tablet Take 1 tablet (325 mg total) by mouth daily with breakfast. Please take with a source of Vitamin C 90 tablet 3   furosemide  (LASIX ) 20 MG tablet TAKE 3 TABLETS BY MOUTH EVERY MORNING AND TAKE 2 TABLETS BY MOUTH EVERY EVENING 150 tablet 3   ipratropium (ATROVENT ) 0.03 % nasal spray Place 2 sprays into both nostrils every 12 (twelve) hours as needed for rhinitis.     lidocaine -prilocaine  (EMLA ) cream Apply topically once a week. 30 g 0   Magnesium  500 MG TABS Take 500 mg by mouth every morning.     meclizine  (ANTIVERT ) 25 MG tablet Take 1 tablet (25 mg total) by mouth 3 (three) times daily as needed for dizziness. 30 tablet 0   mirtazapine  (REMERON ) 7.5 MG tablet TAKE 1 TABLET BY MOUTH AT BEDTIME. 90 tablet 1   multivitamin-iron-minerals-folic acid  (CENTRUM) chewable tablet Chew 1 tablet by mouth daily.     nitroGLYCERIN  (NITROSTAT ) 0.4 MG SL tablet Place 0.4 mg under the tongue every 5 (five) minutes as needed for chest pain.     ondansetron  (ZOFRAN ) 8 MG tablet Take 1 tablet (8 mg total) by mouth every 8 (eight) hours as needed. 30 tablet 0   pantoprazole  (PROTONIX ) 40 MG tablet TAKE 1 TABLET (40 MG TOTAL) BY MOUTH TWICE A DAY BEFORE MEALS 180 tablet 1    potassium chloride  SA (KLOR-CON  M20) 20 MEQ tablet Take 1 tablet (20 mEq total) by mouth daily. (Patient taking differently: Take 20 mEq by mouth 2 (two) times daily.)     prochlorperazine  (COMPAZINE ) 10 MG tablet Take 1 tablet (10 mg total) by mouth every 6 (six) hours as needed for nausea or vomiting. 30 tablet 0   rosuvastatin  (CRESTOR ) 20 MG tablet Take 1 tablet (20 mg total) by mouth daily. 90 tablet 3   temazepam  (RESTORIL ) 30 MG capsule Take 1 capsule (30 mg total) by mouth at bedtime as needed for sleep. 90 capsule 1   traMADol  (ULTRAM ) 50 MG tablet Take 1 tablet (50 mg total) by mouth every 6 (six) hours as needed. 30 tablet 0   venlafaxine  XR (EFFEXOR -XR) 150 MG 24 hr capsule Take 1 capsule (150 mg total) by mouth daily with breakfast. 90 capsule 3   No current facility-administered medications for this visit.    REVIEW OF SYSTEMS:   Constitutional: ( - ) fevers, ( - )  chills , ( - ) night sweats Eyes: ( - ) blurriness of vision, ( - ) double vision, ( - ) watery eyes Ears, nose, mouth, throat, and face: ( - ) mucositis, ( - ) sore throat Respiratory: ( - ) cough, ( - ) dyspnea, ( - ) wheezes Cardiovascular: ( - ) palpitation, ( - ) chest discomfort, ( - ) lower extremity swelling Gastrointestinal:  ( - )  nausea, ( - ) heartburn, ( - ) change in bowel habits Skin: ( - ) abnormal skin rashes Lymphatics: ( - ) new lymphadenopathy, ( - ) easy bruising Neurological: ( - ) numbness, ( - ) tingling, ( - ) new weaknesses Behavioral/Psych: ( - ) mood change, ( - ) new changes  All other systems were reviewed with the patient and are negative.  PHYSICAL EXAMINATION: ECOG PERFORMANCE STATUS: 1 - Symptomatic but completely ambulatory  There were no vitals filed for this visit.      There were no vitals filed for this visit.       GENERAL: well appearing elderly Caucasian male in NAD  SKIN: skin color, texture, turgor are normal, no rashes or significant lesions EYES:  conjunctiva are pink and non-injected, sclera clear LUNGS: clear to auscultation and percussion with normal breathing effort HEART: regular rate & rhythm and no murmurs and no lower extremity edema Musculoskeletal: no cyanosis of digits and no clubbing.  PSYCH: alert & oriented x 3, fluent speech NEURO: no focal motor/sensory deficits  LABORATORY DATA:  I have reviewed the data as listed    Latest Ref Rng & Units 03/07/2024   11:47 AM 02/29/2024   12:39 PM 02/22/2024    8:24 AM  CBC  WBC 4.0 - 10.5 K/uL 6.4  6.5  6.5   Hemoglobin 13.0 - 17.0 g/dL 7.7  8.0  8.5   Hematocrit 39.0 - 52.0 % 23.8  25.0  26.9   Platelets 150 - 400 K/uL 165  180  147        Latest Ref Rng & Units 03/07/2024   11:47 AM 02/29/2024   12:39 PM 02/22/2024    8:24 AM  CMP  Glucose 70 - 99 mg/dL 821  823  795   BUN 8 - 23 mg/dL 15  22  21    Creatinine 0.61 - 1.24 mg/dL 8.12  7.98  7.96   Sodium 135 - 145 mmol/L 134  134  135   Potassium 3.5 - 5.1 mmol/L 4.1  4.2  4.2   Chloride 98 - 111 mmol/L 103  102  104   CO2 22 - 32 mmol/L 24  25  24    Calcium  8.9 - 10.3 mg/dL 8.6  8.6    8.3  8.1   Total Protein 6.5 - 8.1 g/dL 7.3  7.4  6.9   Total Bilirubin 0.0 - 1.2 mg/dL 0.7  0.7  0.9   Alkaline Phos 38 - 126 U/L 114  137  122   AST 15 - 41 U/L 22  25  22    ALT 0 - 44 U/L 13  23  16      RADIOGRAPHIC STUDIES: IR NEPHROSTOMY EXCHANGE BILATERAL Result Date: 02/21/2024 INDICATION: Bladder cancer and bilateral nephrostomy tubes. Patient presents for routine exchange. EXAM: EXCHANGE OF BILATERAL NEPHROSTOMY TUBES WITH FLUOROSCOPY Physician: Juliene [name]. Philip, MD COMPARISON:  None Available. MEDICATIONS: 1% lidocaine  for local anesthetic ANESTHESIA/SEDATION: None CONTRAST:  25 mL OMNIPAQUE  IOHEXOL  300 MG/ML SOLN - administered into the collecting system(s) FLUOROSCOPY: Radiation Exposure Index (as provided by the fluoroscopic device): 21 mGy Kerma COMPLICATIONS: None immediate. PROCEDURE: The procedure was explained to the  patient. The risks and benefits of the procedure were discussed and the patient's questions were addressed. Informed consent was obtained from the patient. Patient was placed prone. Both flanks were prepped and draped in sterile fashion. Maximal barrier sterile technique was utilized including caps, mask, sterile gowns, sterile gloves, sterile drape, hand hygiene  and skin antiseptic. Contrast was injected through the right nephrostomy tube. Nephrostomy tube was cut and removed over a wire. New 10 French multipurpose drain was advanced over the wire and reconstituted in the renal pelvis. Contrast injection confirmed placement in the renal pelvis. Skin was anesthetized with 1% lidocaine . Right nephrostomy tube was sutured to skin and attached to a gravity bag. Contrast was injected through the left nephrostomy tube. Nephrostomy tube was cut and removed over a wire. New 10 French multipurpose drain was advanced over the wire and reconstituted in the renal pelvis. Contrast injection confirmed placement in the renal pelvis. Skin was anesthetized with 1% lidocaine . Left nephrostomy tube was sutured to skin and attached to a gravity bag. Fluoroscopic images were taken and saved for this procedure. FINDINGS: Left nephrostomy tube is reconstituted in left renal pelvis. Right nephrostomy tube is reconstituted in the right renal pelvis. IMPRESSION: Successful exchange of bilateral nephrostomy tubes with fluoroscopy. Electronically Signed   By: Juliene Balder M.D.   On: 02/21/2024 14:39     ASSESSMENT & PLAN CHANOCH MCCLEERY 68 y.o. 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  After review of the labs, review of the records, and discussion with the patient the patients findings are most consistent with BCG unresponsive high risk non-muscle invasive bladder cancer, not a candidate for cystectomy.  # BCG-Unresponsive High Risk  Non-Muscle Invasive Bladder Cancer --patient is not felt to be a candidate for cystectomy -- Recommended pembrolizumab  200 mg q. 21 days until progression or intolerance up to 24 months. --Received 8 cycles of Pembrolizumab  on 08/12/2022-01/13/2023: Cycle 8 of Pembrolizumab   --On 03/03/2023, underwent Cystectomy/Prostatectomy showed infiltrative high-grade urothelial carcinoma, size 5.8 cm involving bladder dome, anterior wall and prostatic stroma. Tumor invades directly into prostatic stroma at apex and mid portion of the gland (pT4a). Metastatic urothelial cancer in one of two left common iliac lymph nodes.  -- Given the results of the pathology showing positive margins and spread to the lymph node we are considering radiation therapy versus chemotherapy.  For chemotherapy, recommend gemcitabine  and cisplatin x 4 cycles.  This would consist of gemcitabine  1000 mg/m on days 1 and 8 and cisplatin 70 mg/m on day 1 of 21-day cycle for 4 cycles. -- Due to kidney dysfunction and anemia, chemotherapy was deferred and patient underwent adjuvant radiation to the surgical bed from 06/19/2023 until 07/27/2023.  He received 50.4 Gray in 28 fractions. PLAN:  --Today is Cycle 5, Day 15 of Enfortumab therapy --Labs from today show white blood cell 4.9, hemoglobin 8.6, MCV 95.5, platelets 123 --Due for CT CAP in Apr 15, 2024. CT scan on 01/22/2024 showed response to treatment with no evidence of progression.  --Proceed with treatment today without any dose modifications.  --RTC weekly for treatment and every 2 weeks for clinic visit.  # Skin Rash -- Occurred after receiving blood transfusion, etiology not entirely clear. -- Patient has been applying moisturizer to the skin and has been taking Benadryl . -- Skin is persistently dry and itchy and erythematous. -- Completed steroid taper with 60 mg for 5 days, 40 for 5 days, and 20 for 5 days.  #Anemia -Labs today show white blood cell 4.9, hemoglobin 8.6, MCV 95.5,  platelets 123 --May be multifactorial due to nutritional deficiency as well as kidney dysfunction. -- Nutritional labs show borderline vitamin B12 deficiency and iron deficiency. -- Patient is currently taking vitamin B12 p.o. daily and ferrous  sulfate every other day. -- Receives vitamin B12 1000 mcg injection monthly, last received on 01/04/2024. -- Last received IV monoferric  1000 mg x 1 dose on 08/25/2023.  -- Will recheck vitamin B12 and iron levels are next visit.   #Hypokalemia: --Potassium level is 4.5 today --Patient is currently taking potassium supplement 20 meq BID. Advised to continue  #Flank pain--improved: --Secondary to bilateral nephrostomy. Patient is currently taking tylenol  with minimal improvement.  --Has prescription for tramadol  50 mg PO q 6 hours as needed.   # AKI -- Currently has bilateral nephrostomies, underwent exchange on yesterday, 01/10/2024. --monitoring Cr at each visit.   #Supportive Care -- chemotherapy education complete -- port placed -- zofran  8mg  q8H PRN and compazine  10mg  PO q6H for nausea -- EMLA  cream for port -- no pain medication required at this time.    No orders of the defined types were placed in this encounter.  All questions were answered. The patient knows to call the clinic with any problems, questions or concerns.  I have spent a total of 30 minutes minutes of face-to-face and non-face-to-face time, preparing to see the patient, performing a medically appropriate examination, counseling and educating the patient, documenting clinical information in the electronic health record,and care coordination.   Norleen IVAR Kidney, MD Department of Hematology/Oncology [facility] Phone: [phone] Pager: [phone] Email: [email or web address]

## 2024-03-20 NOTE — Progress Notes (Signed)
 Today should be Cycle 6 Day 1 per Dr. Federico. Orders released under Cycle 5 Day 15 have been discontinued.  Harlene Nasuti, PharmD Oncology Infusion Pharmacist 03/20/2024 1:21 PM

## 2024-03-24 ENCOUNTER — Encounter: Payer: Self-pay | Admitting: Hematology and Oncology

## 2024-03-26 ENCOUNTER — Telehealth: Payer: Self-pay

## 2024-03-26 DIAGNOSIS — Z436 Encounter for attention to other artificial openings of urinary tract: Secondary | ICD-10-CM | POA: Diagnosis not present

## 2024-03-26 DIAGNOSIS — C679 Malignant neoplasm of bladder, unspecified: Secondary | ICD-10-CM

## 2024-03-26 NOTE — Progress Notes (Unsigned)
 Complex Care Management Note Care Guide Note  03/26/2024 Name: Luis [name] MRN: [id number] DOB: 10-Jan-1956   Complex Care Management Outreach Attempts: An unsuccessful telephone outreach was attempted today to offer the patient information about available complex care management services.  Follow Up Plan:  Additional outreach attempts will be made to offer the patient complex care management information and services.   Encounter Outcome:  No Answer  Dreama [facility]  [facility], [facility] VBCI Assistant Direct Dial: [phone]  Fax: [phone]

## 2024-03-27 NOTE — Progress Notes (Signed)
 Complex Care Management Note  Care Guide Note 03/27/2024 Name: Luis [name] MRN: [id number] DOB: 1955/12/30  Luis [name] is a 68 y.o. year old male who sees Johnny Garnette LABOR, MD for primary care. I reached out to [place] JONELLE Stain by phone today to offer complex care management services.  Mr. Guthrie was given information about Complex Care Management services today including:   The Complex Care Management services include support from the care team which includes your Nurse Care Manager, Clinical Social Worker, or Pharmacist.  The Complex Care Management team is here to help remove barriers to the health concerns and goals most important to you. Complex Care Management services are voluntary, and the patient may decline or stop services at any time by request to their care team member.   Complex Care Management Consent Status: Patient did not agree to participate in complex care management services at this time.  Follow up plan:  Spouse will follow up with PCP as needed.  Encounter Outcome:  Patient Refused  Dreama Lynwood [facility], [facility] VBCI Assistant Direct Dial: [phone]  Fax: [phone]

## 2024-03-28 ENCOUNTER — Inpatient Hospital Stay: Attending: Hematology and Oncology

## 2024-03-28 ENCOUNTER — Encounter: Payer: Self-pay | Admitting: Hematology and Oncology

## 2024-03-28 ENCOUNTER — Inpatient Hospital Stay: Attending: Hematology and Oncology | Admitting: Dietician

## 2024-03-28 ENCOUNTER — Inpatient Hospital Stay

## 2024-03-28 VITALS — BP 133/80 | HR 55 | Temp 97.7°F | Resp 17 | Wt 233.4 lb

## 2024-03-28 DIAGNOSIS — C671 Malignant neoplasm of dome of bladder: Secondary | ICD-10-CM | POA: Insufficient documentation

## 2024-03-28 DIAGNOSIS — C673 Malignant neoplasm of anterior wall of bladder: Secondary | ICD-10-CM | POA: Diagnosis present

## 2024-03-28 DIAGNOSIS — Z5112 Encounter for antineoplastic immunotherapy: Secondary | ICD-10-CM | POA: Insufficient documentation

## 2024-03-28 DIAGNOSIS — Z95828 Presence of other vascular implants and grafts: Secondary | ICD-10-CM

## 2024-03-28 DIAGNOSIS — Z79899 Other long term (current) drug therapy: Secondary | ICD-10-CM | POA: Insufficient documentation

## 2024-03-28 DIAGNOSIS — C679 Malignant neoplasm of bladder, unspecified: Secondary | ICD-10-CM

## 2024-03-28 LAB — CMP (CANCER CENTER ONLY)
ALT: 12 U/L (ref 0–44)
AST: 25 U/L (ref 15–41)
Albumin: 3.5 g/dL (ref 3.5–5.0)
Alkaline Phosphatase: 112 U/L (ref 38–126)
Anion gap: 8 (ref 5–15)
BUN: 20 mg/dL (ref 8–23)
CO2: 25 mmol/L (ref 22–32)
Calcium: 8.6 mg/dL — ABNORMAL LOW (ref 8.9–10.3)
Chloride: 104 mmol/L (ref 98–111)
Creatinine: 1.63 mg/dL — ABNORMAL HIGH (ref 0.61–1.24)
GFR, Estimated: 45 mL/min — ABNORMAL LOW (ref >60)
Glucose, Bld: 133 mg/dL — ABNORMAL HIGH (ref 70–99)
Potassium: 4.4 mmol/L (ref 3.5–5.1)
Sodium: 137 mmol/L (ref 135–145)
Total Bilirubin: 0.5 mg/dL (ref 0.0–1.2)
Total Protein: 7.1 g/dL (ref 6.5–8.1)

## 2024-03-28 LAB — CBC WITH DIFFERENTIAL (CANCER CENTER ONLY)
Abs Immature Granulocytes: 0.01 K/uL (ref 0.00–0.07)
Basophils Absolute: 0 K/uL (ref 0.0–0.1)
Basophils Relative: 1 %
Eosinophils Absolute: 0.2 K/uL (ref 0.0–0.5)
Eosinophils Relative: 4 %
HCT: 26.7 % — ABNORMAL LOW (ref 39.0–52.0)
Hemoglobin: 8.4 g/dL — ABNORMAL LOW (ref 13.0–17.0)
Immature Granulocytes: 0 %
Lymphocytes Relative: 10 %
Lymphs Abs: 0.4 K/uL — ABNORMAL LOW (ref 0.7–4.0)
MCH: 30.2 pg (ref 26.0–34.0)
MCHC: 31.5 g/dL (ref 30.0–36.0)
MCV: 96 fL (ref 80.0–100.0)
Monocytes Absolute: 0.4 K/uL (ref 0.1–1.0)
Monocytes Relative: 9 %
Neutro Abs: 3.2 K/uL (ref 1.7–7.7)
Neutrophils Relative %: 76 %
Platelet Count: 94 K/uL — ABNORMAL LOW (ref 150–400)
RBC: 2.78 MIL/uL — ABNORMAL LOW (ref 4.22–5.81)
RDW: 18.9 % — ABNORMAL HIGH (ref 11.5–15.5)
WBC Count: 4.2 K/uL (ref 4.0–10.5)
nRBC: 0 % (ref 0.0–0.2)

## 2024-03-28 MED ORDER — CYANOCOBALAMIN 1000 MCG/ML IJ SOLN
1000.0000 ug | Freq: Once | INTRAMUSCULAR | Status: AC
  Administered 2024-03-28: 1000 ug via INTRAMUSCULAR
  Filled 2024-03-28: qty 1

## 2024-03-28 MED ORDER — SODIUM CHLORIDE 0.9 % IV SOLN: INTRAVENOUS | Status: DC

## 2024-03-28 MED ORDER — PROCHLORPERAZINE MALEATE 10 MG PO TABS
10.0000 mg | ORAL_TABLET | Freq: Once | ORAL | Status: AC
  Administered 2024-03-28: 10 mg via ORAL
  Filled 2024-03-28: qty 1

## 2024-03-28 MED ORDER — SODIUM CHLORIDE 0.9 % IV SOLN
125.0000 mg | Freq: Once | INTRAVENOUS | Status: AC
  Administered 2024-03-28: 125 mg via INTRAVENOUS
  Filled 2024-03-28: qty 12

## 2024-03-29 ENCOUNTER — Encounter: Payer: Self-pay | Admitting: Hematology and Oncology

## 2024-03-29 NOTE — Progress Notes (Signed)
 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 Completed adjuvant radiation 2/24-4/3. He is currently receiving Enfortumab (start 7/10).   Met with patient in infusion. Wife present. Patient awake and alert. Reports skipping last treatment was good. His appetite improved and enjoyed Thanksgiving. He is eating anything and everything. Currently not drinking Ensure due to improved po. Patient gained 12 lbs from 11/13-11/26 which is significant. He does report BLE swelling that is gone down a little bit. He is on 20 mg lasix /day. Patient agreeable to let RD assess edema (3+ pitting to BLE with concurrence from NT). Infusion RN Engineer, Maintenance (it)) informed covering provider via secure chat.    Medications: reviewed   Labs: glucose 133, Cr 1.63  Anthropometrics: Wt  233 lb 6.4 oz today - increased 6.4% (14 lbs) in the last 3 weeks which is significant - BLE edema +3 present    NUTRITION DIAGNOSIS: Unintended wt loss - unable to assess due to fluid    INTERVENTION:  Continue small frequent meals/snacks - recommend protein source at every meal Recommend resuming daily Ensure Complete if decline in po Secure chat sent by infusion RN due to fluid concerns     MONITORING, EVALUATION, GOAL: wt trends, intake   NEXT VISIT: Friday January 2 during infusion

## 2024-04-03 ENCOUNTER — Inpatient Hospital Stay (HOSPITAL_BASED_OUTPATIENT_CLINIC_OR_DEPARTMENT_OTHER): Admitting: Physician Assistant

## 2024-04-03 ENCOUNTER — Inpatient Hospital Stay

## 2024-04-03 ENCOUNTER — Other Ambulatory Visit (HOSPITAL_COMMUNITY): Payer: Self-pay | Admitting: Interventional Radiology

## 2024-04-03 ENCOUNTER — Ambulatory Visit (HOSPITAL_COMMUNITY)
Admission: RE | Admit: 2024-04-03 | Discharge: 2024-04-03 | Disposition: A | Discharge status: Home / Self Care | Source: Ambulatory Visit | Attending: Diagnostic Radiology | Admitting: Diagnostic Radiology

## 2024-04-03 VITALS — BP 120/63 | HR 57 | Temp 98.1°F | Resp 16

## 2024-04-03 DIAGNOSIS — C679 Malignant neoplasm of bladder, unspecified: Secondary | ICD-10-CM

## 2024-04-03 DIAGNOSIS — R7989 Other specified abnormal findings of blood chemistry: Secondary | ICD-10-CM | POA: Diagnosis not present

## 2024-04-03 DIAGNOSIS — Z936 Other artificial openings of urinary tract status: Secondary | ICD-10-CM

## 2024-04-03 DIAGNOSIS — Z466 Encounter for fitting and adjustment of urinary device: Secondary | ICD-10-CM | POA: Insufficient documentation

## 2024-04-03 DIAGNOSIS — Z5112 Encounter for antineoplastic immunotherapy: Secondary | ICD-10-CM | POA: Diagnosis not present

## 2024-04-03 DIAGNOSIS — Z8551 Personal history of malignant neoplasm of bladder: Secondary | ICD-10-CM | POA: Insufficient documentation

## 2024-04-03 DIAGNOSIS — Z906 Acquired absence of other parts of urinary tract: Secondary | ICD-10-CM | POA: Insufficient documentation

## 2024-04-03 HISTORY — PX: IR NEPHROSTOMY EXCHANGE LEFT: IMG6069

## 2024-04-03 LAB — CBC WITH DIFFERENTIAL (CANCER CENTER ONLY)
Abs Immature Granulocytes: 0.01 K/uL (ref 0.00–0.07)
Basophils Absolute: 0 K/uL (ref 0.0–0.1)
Basophils Relative: 1 %
Eosinophils Absolute: 0.3 K/uL (ref 0.0–0.5)
Eosinophils Relative: 7 %
HCT: 25.2 % — ABNORMAL LOW (ref 39.0–52.0)
Hemoglobin: 8.1 g/dL — ABNORMAL LOW (ref 13.0–17.0)
Immature Granulocytes: 0 %
Lymphocytes Relative: 11 %
Lymphs Abs: 0.5 K/uL — ABNORMAL LOW (ref 0.7–4.0)
MCH: 30.7 pg (ref 26.0–34.0)
MCHC: 32.1 g/dL (ref 30.0–36.0)
MCV: 95.5 fL (ref 80.0–100.0)
Monocytes Absolute: 0.4 K/uL (ref 0.1–1.0)
Monocytes Relative: 10 %
Neutro Abs: 3.1 K/uL (ref 1.7–7.7)
Neutrophils Relative %: 71 %
Platelet Count: 96 K/uL — ABNORMAL LOW (ref 150–400)
RBC: 2.64 MIL/uL — ABNORMAL LOW (ref 4.22–5.81)
RDW: 18.7 % — ABNORMAL HIGH (ref 11.5–15.5)
WBC Count: 4.3 K/uL (ref 4.0–10.5)
nRBC: 0 % (ref 0.0–0.2)

## 2024-04-03 LAB — CMP (CANCER CENTER ONLY)
ALT: 15 U/L (ref 0–44)
AST: 30 U/L (ref 15–41)
Albumin: 3.5 g/dL (ref 3.5–5.0)
Alkaline Phosphatase: 123 U/L (ref 38–126)
Anion gap: 10 (ref 5–15)
BUN: 30 mg/dL — ABNORMAL HIGH (ref 8–23)
CO2: 24 mmol/L (ref 22–32)
Calcium: 8.5 mg/dL — ABNORMAL LOW (ref 8.9–10.3)
Chloride: 104 mmol/L (ref 98–111)
Creatinine: 2.18 mg/dL — ABNORMAL HIGH (ref 0.61–1.24)
GFR, Estimated: 32 mL/min — ABNORMAL LOW (ref >60)
Glucose, Bld: 134 mg/dL — ABNORMAL HIGH (ref 70–99)
Potassium: 4.1 mmol/L (ref 3.5–5.1)
Sodium: 137 mmol/L (ref 135–145)
Total Bilirubin: 0.5 mg/dL (ref 0.0–1.2)
Total Protein: 7.1 g/dL (ref 6.5–8.1)

## 2024-04-03 MED ORDER — LIDOCAINE HCL 1 % IJ SOLN
20.0000 mL | Freq: Once | INTRAMUSCULAR | Status: AC
  Administered 2024-04-03: 20 mL via INTRADERMAL

## 2024-04-03 MED ORDER — SODIUM CHLORIDE 0.9 % IV SOLN
125.0000 mg | Freq: Once | INTRAVENOUS | Status: AC
  Administered 2024-04-03: 125 mg via INTRAVENOUS
  Filled 2024-04-03: qty 9

## 2024-04-03 MED ORDER — PROCHLORPERAZINE MALEATE 10 MG PO TABS
10.0000 mg | ORAL_TABLET | Freq: Once | ORAL | Status: AC
  Administered 2024-04-03: 10 mg via ORAL
  Filled 2024-04-03: qty 1

## 2024-04-03 MED ORDER — IOHEXOL 300 MG/ML  SOLN
50.0000 mL | Freq: Once | INTRAMUSCULAR | Status: AC | PRN
  Administered 2024-04-03: 20 mL

## 2024-04-03 MED ORDER — LIDOCAINE HCL 1 % IJ SOLN
INTRAMUSCULAR | Status: AC
  Filled 2024-04-03: qty 20

## 2024-04-03 MED ORDER — SODIUM CHLORIDE 0.9 % IV SOLN: INTRAVENOUS | Status: DC

## 2024-04-03 NOTE — Patient Instructions (Signed)
 [facility] - A DEPT OF [facility]  Discharge Instructions: Thank you for choosing [facility] to provide your oncology and hematology care.   If you have a lab appointment with the Cancer Center, please go directly to the Cancer Center and check in at the registration area.   Wear comfortable clothing and clothing appropriate for easy access to any Portacath or PICC line.   We strive to give you quality time with your provider. You may need to reschedule your appointment if you arrive late (15 or more minutes).  Arriving late affects you and other patients whose appointments are after yours.  Also, if you miss three or more appointments without notifying the office, you may be dismissed from the clinic at the provider's discretion.      For prescription refill requests, have your pharmacy contact our office and allow 72 hours for refills to be completed.    Today you received the following chemotherapy and/or immunotherapy agents: Enfortumab vedotin -ejfv (Padcev )    To help prevent nausea and vomiting after your treatment, we encourage you to take your nausea medication as directed.  BELOW ARE SYMPTOMS THAT SHOULD BE REPORTED IMMEDIATELY: *FEVER GREATER THAN 100.4 F (38 C) OR HIGHER *CHILLS OR SWEATING *NAUSEA AND VOMITING THAT IS NOT CONTROLLED WITH YOUR NAUSEA MEDICATION *UNUSUAL SHORTNESS OF BREATH *UNUSUAL BRUISING OR BLEEDING *URINARY PROBLEMS (pain or burning when urinating, or frequent urination) *BOWEL PROBLEMS (unusual diarrhea, constipation, pain near the anus) TENDERNESS IN MOUTH AND THROAT WITH OR WITHOUT PRESENCE OF ULCERS (sore throat, sores in mouth, or a toothache) UNUSUAL RASH, SWELLING OR PAIN  UNUSUAL VAGINAL DISCHARGE OR ITCHING   Items with * indicate a potential emergency and should be followed up as soon as possible or go to the Emergency Department if any problems should occur.  Please show the CHEMOTHERAPY ALERT  CARD or IMMUNOTHERAPY ALERT CARD at check-in to the Emergency Department and triage nurse.  Should you have questions after your visit or need to cancel or reschedule your appointment, please contact [facility] - A DEPT OF [facility]  Dept: [phone]  and follow the prompts.  Office hours are 8:00 a.m. to 4:30 p.m. Monday - Friday. Please note that voicemails left after 4:00 p.m. may not be returned until the following business day.  We are closed weekends and major holidays. You have access to a nurse at all times for urgent questions. Please call the main number to the clinic Dept: [phone] and follow the prompts.   For any non-urgent questions, you may also contact your provider using MyChart. We now offer e-Visits for anyone 39 and older to request care online for non-urgent symptoms. For details visit [email or web address].   Also download the MyChart app! Go to the app store, search MyChart, open the app, select [facility], and log in with your MyChart username and password.

## 2024-04-03 NOTE — Progress Notes (Signed)
 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 Federico  08/12/2022: Cycle 1 of Pembrolizumab  09/02/2022: Cycle 2 of Pembrolizumab   09/23/2022: Cycle 3 of Pembrolizumab  10/14/2022: Cycle 4 of Pembrolizumab  11/04/2022: Cycle 5 of Pembrolizumab  11/25/2022: Cycle 6 of Pembrolizumab  12/20/2022: Cycle 7 of Pembrolizumab  01/13/2023: Cycle 8 of Pembrolizumab   03/03/2023: Cystectomy/Prostatectomy showed infiltrative high-grade urothelial carcinoma, size 5.8 cm involving bladder dome, anterior wall and prostatic stroma. Tumor invades directly into prostatic stroma at apex and mid portion of the gland (pT4a). Metastatic urothelial cancer in one of two left common iliac lymph nodes.  06/19/2023-07/27/2023: Received adjuvant radiation to surgical bed.  He received 50.4 Gray in 28 fractions. 11/02/2023-Present: Started Cycle 1 Day 1 of Enfortumab.   CHIEF COMPLAINTS/PURPOSE OF CONSULTATION:  High Risk Non-Muscle Invasive Bladder Cancer   HISTORY OF PRESENTING ILLNESS:  Agent SAUNDERS Stain 68 y.o. male with medical history significant for BCG unresponsive high risk nonmuscle invasive  bladder cancer who presents for a follow up visit.  He was last seen on 03/20/2024.  He presents today to continue Enfortumab treatment.   On exam today Mr. Kirn reports his energy is fairly stable. Has episodes of fatigue which requires resting. His appetite and weight are unchanged. He denies nausea, vomiting or bowel habit changes. He denies overt signs of bleeding including hematochezia or melena.  His nephrostomy tubes are functioning well with no bleeding or clogs.  He is scheduled for his nephrostomy exchange later today. He developed worsening lower extremity edema which has improved after increase his dose of diuretics from once daily to twice daily. He denies fevers, chills, sweats, shortness of breath, chest pain or cough. A full 10 point ROS is otherwise negative.  He is willing and able to proceed with treatment today.  MEDICAL HISTORY:  Past Medical History:  Diagnosis Date   Anxiety    takes Valium  as needed   Aortic stenosis, moderate    Arthritis    Ascending aortic aneurysm    CAD (coronary artery disease)    a. s/p PCI of the RCA 8/12 with DES by Dr Wonda, preserved EF. b. LHC/RHC (2/16) with mean RA 12, PA 32/15, mean PCWP 18, CI 3.47; patent mid and distal RCA stents, 50-60% proximal stenosis small PDA.      Cancer [facility])    bladder   Carotid stenosis    a. Carotid US  (05/2013):  Bilateral 1-39% ICA; L thyroid  nodule (prior hx of aspiration).   Chronic diastolic CHF (congestive heart failure) (HCC)    Complication of anesthesia    difficulty waking up after gallbladder surgery   Depression    Dyspnea    Essential hypertension    GERD (gastroesophageal reflux disease)    if needed will take OTC meds    Heart murmur  History of colonic polyps    hyperplastic   Hyperlipidemia    Joint pain    Lesion of bladder    Myocardial infarction (HCC) 2012   Obesity (BMI 30-39.9) 02/29/2016   Pre-diabetes    Restless leg    Sleep apnea    uses cpap   Tubular adenoma  of colon    Vertigo    takes Meclizine  as needed    SURGICAL HISTORY: Past Surgical History:  Procedure Laterality Date   AORTIC VALVE REPLACEMENT N/A 09/16/2021   Procedure: AORTIC VALVE REPLACEMENT (AVR);  Surgeon: Lucas Dorise POUR, MD;  Location: [facility] OR;  Service: Open Heart Surgery;  Laterality: N/A;   CATARACT EXTRACTION   4 YRS AGO   BOTH EYES   CHOLECYSTECTOMY  07/21/2011   Procedure: LAPAROSCOPIC CHOLECYSTECTOMY WITH INTRAOPERATIVE CHOLANGIOGRAM;  Surgeon: Alm VEAR Angle, MD;  Location: [facility];  Service: General;  Laterality: N/A;   CORONARY ANGIOPLASTY  2012   2 stents   coronary stenting     s/p PCI of the RCA by Dr Wonda 8/12 with 2 promus stents   CYSTOSCOPY W/ RETROGRADES Bilateral 12/13/2019   Procedure: CYSTOSCOPY WITH RETROGRADE PYELOGRAM;  Surgeon: Alvaro Hummer, MD;  Location: [facility];  Service: Urology;  Laterality: Bilateral;   CYSTOSCOPY W/ RETROGRADES Bilateral 10/14/2020   Procedure: CYSTOSCOPY WITH RETROGRADE PYELOGRAM;  Surgeon: Alvaro Hummer, MD;  Location: [facility];  Service: Urology;  Laterality: Bilateral;   CYSTOSCOPY W/ RETROGRADES Bilateral 12/16/2020   Procedure: CYSTOSCOPY WITH RETROGRADE PYELOGRAM;  Surgeon: Alvaro Hummer, MD;  Location: [facility];  Service: Urology;  Laterality: Bilateral;   CYSTOSCOPY W/ RETROGRADES Bilateral 06/03/2022   Procedure: CYSTOSCOPY WITH RETROGRADE PYELOGRAM;  Surgeon: Alvaro Hummer, MD;  Location: [facility];  Service: Urology;  Laterality: Bilateral;   CYSTOSCOPY W/ RETROGRADES Bilateral 12/28/2022   Procedure: CYSTOSCOPY WITH RETROGRADE PYELOGRAM, FULGARATION OF BLEEDERS;  Surgeon: Alvaro Hummer KATHEE Mickey., MD;  Location: [facility];  Service: Urology;  Laterality: Bilateral;   CYSTOSCOPY WITH INJECTION N/A 03/03/2023   Procedure: CYSTOSCOPY WITH INDOCYANINE INJECTION;  Surgeon: Alvaro Hummer KATHEE Mickey., MD;  Location: [facility];  Service: Urology;  Laterality: N/A;  360 MINUTES    ESOPHAGOGASTRODUODENOSCOPY N/A 06/28/2023   Procedure: EGD (ESOPHAGOGASTRODUODENOSCOPY);  Surgeon: Charlanne Groom, MD;  Location: [facility];  Service: Gastroenterology;  Laterality: N/A;   IR IMAGING GUIDED PORT INSERTION  05/29/2023   IR NEPHROSTOMY EXCHANGE LEFT  08/07/2023   IR NEPHROSTOMY EXCHANGE LEFT  10/02/2023   IR NEPHROSTOMY EXCHANGE LEFT  11/27/2023   IR NEPHROSTOMY EXCHANGE LEFT  01/10/2024   IR NEPHROSTOMY EXCHANGE LEFT  02/21/2024   IR NEPHROSTOMY PLACEMENT RIGHT  07/01/2023   IR RADIOLOGY PERIPHERAL GUIDED IV START  07/01/2023   IR US  GUIDE VASC ACCESS LEFT  07/01/2023   LEFT AND RIGHT HEART CATHETERIZATION WITH CORONARY ANGIOGRAM N/A 06/23/2014   Procedure: LEFT AND RIGHT HEART CATHETERIZATION WITH CORONARY ANGIOGRAM;  Surgeon: Ezra GORMAN Shuck, MD;  Location: [facility] CATH LAB;  Service: Cardiovascular;  Laterality: N/A;   NECK SURGERY  03/23/09   per Dr. Barbarann, cervical fusion    PERICARDIOCENTESIS N/A 09/28/2021   Procedure: PERICARDIOCENTESIS;  Surgeon: Dann Candyce GORMAN, MD;  Location: [facility] INVASIVE CV LAB;  Service: Cardiovascular;  Laterality: N/A;   REPLACEMENT ASCENDING AORTA N/A 09/16/2021   Procedure: REPLACEMENT ASCENDING AORTA WITH 30 X HEMASHIELD PLATINUM WOVEN DOUBLE VELOUR VASCULAR GRAFT;  Surgeon: Lucas Dorise POUR, MD;  Location: [facility] OR;  Service: Open Heart  Surgery;  Laterality: N/A;  CIRC ARREST   right elbow surgery     RIGHT HEART CATH N/A 06/15/2023   Procedure: RIGHT HEART CATH;  Surgeon: Rolan Ezra RAMAN, MD;  Location: [facility] INVASIVE CV LAB;  Service: Cardiovascular;  Laterality: N/A;   RIGHT HEART CATH AND CORONARY ANGIOGRAPHY N/A 07/08/2021   Procedure: RIGHT HEART CATH AND CORONARY ANGIOGRAPHY;  Surgeon: Rolan Ezra RAMAN, MD;  Location: [facility] INVASIVE CV LAB;  Service: Cardiovascular;  Laterality: N/A;   ROBOT ASSISTED LAPAROSCOPIC COMPLETE CYSTECT ILEAL CONDUIT N/A 03/03/2023   Procedure: XI ROBOTIC ASSISTED LAPAROSCOPIC COMPLETE CYSTECTECTOMY WITH  ILEAL CONDUIT DIVERSION;   Surgeon: Alvaro Ricardo KATHEE Mickey., MD;  Location: [facility];  Service: Urology;  Laterality: N/A;   ROBOT ASSISTED LAPAROSCOPIC RADICAL PROSTATECTOMY N/A 03/03/2023   Procedure: XI ROBOTIC ASSISTED LAPAROSCOPIC RADICAL PROSTATECTOMY WITH LYMPH NODE DISSECTION;  Surgeon: Alvaro Ricardo KATHEE Mickey., MD;  Location: [facility];  Service: Urology;  Laterality: N/A;   solonscopy  05/23/08   per Dr. Jakie inch hemorrhoids only, repeat in 5 years   SUBXYPHOID PERICARDIAL WINDOW N/A 09/28/2021   Procedure: SUBXYPHOID PERICARDIAL WINDOW;  Surgeon: Lucas Dorise POUR, MD;  Location: [facility];  Service: Thoracic;  Laterality: N/A;   TEE WITHOUT CARDIOVERSION N/A 06/23/2014   Procedure: TRANSESOPHAGEAL ECHOCARDIOGRAM (TEE);  Surgeon: Ezra RAMAN Rolan, MD;  Location: [facility] ENDOSCOPY;  Service: Cardiovascular;  Laterality: N/A;   TEE WITHOUT CARDIOVERSION N/A 01/21/2016   Procedure: TRANSESOPHAGEAL ECHOCARDIOGRAM (TEE);  Surgeon: Ezra RAMAN Rolan, MD;  Location: [facility] ENDOSCOPY;  Service: Cardiovascular;  Laterality: N/A;   TEE WITHOUT CARDIOVERSION N/A 09/16/2021   Procedure: TRANSESOPHAGEAL ECHOCARDIOGRAM (TEE);  Surgeon: Lucas Dorise POUR, MD;  Location: [facility] OR;  Service: Open Heart Surgery;  Laterality: N/A;   TEE WITHOUT CARDIOVERSION N/A 09/28/2021   Procedure: TRANSESOPHAGEAL ECHOCARDIOGRAM (TEE);  Surgeon: Lucas Dorise POUR, MD;  Location: [facility] OR;  Service: Thoracic;  Laterality: N/A;   TONSILLECTOMY     TRANSESOPHAGEAL ECHOCARDIOGRAM (CATH LAB) N/A 04/17/2023   Procedure: TRANSESOPHAGEAL ECHOCARDIOGRAM;  Surgeon: Jeffrie Oneil BROCKS, MD;  Location: [facility] INVASIVE CV LAB;  Service: Cardiovascular;  Laterality: N/A;   TRANSURETHRAL RESECTION OF BLADDER TUMOR N/A 10/21/2019   Procedure: TRANSURETHRAL RESECTION OF BLADDER TUMOR (TURBT);  Surgeon: Ottelin, Mark, MD;  Location: [facility];  Service: Urology;  Laterality: N/A;   TRANSURETHRAL RESECTION OF BLADDER TUMOR N/A 12/13/2019   Procedure: TRANSURETHRAL RESECTION OF BLADDER TUMOR  (TURBT);  Surgeon: Alvaro Ricardo, MD;  Location: [facility];  Service: Urology;  Laterality: N/A;  1 HR   TRANSURETHRAL RESECTION OF BLADDER TUMOR N/A 10/14/2020   Procedure: TRANSURETHRAL RESECTION OF BLADDER TUMOR (TURBT);  Surgeon: Alvaro Ricardo, MD;  Location: [facility];  Service: Urology;  Laterality: N/A;   TRANSURETHRAL RESECTION OF BLADDER TUMOR N/A 12/16/2020   Procedure: RESTAGING TRANSURETHRAL RESECTION OF BLADDER TUMOR (TURBT);  Surgeon: Alvaro Ricardo, MD;  Location: [facility];  Service: Urology;  Laterality: N/A;   TRANSURETHRAL RESECTION OF BLADDER TUMOR N/A 06/03/2022   Procedure: TRANSURETHRAL RESECTION OF BLADDER TUMOR (TURBT);  Surgeon: Alvaro Ricardo, MD;  [facility];  Service: Urology;  Laterality: N/A;   UMBILICAL HERNIA REPAIR  03/03/2023   Procedure: HERNIA REPAIR UMBILICAL;  Surgeon: Alvaro Ricardo KATHEE Mickey., MD;  Location: [facility];  Service: Urology;;    SOCIAL HISTORY: Social History   Socioeconomic History   Marital status: Married    Spouse name: Not on file   Number of children: 4  Years of education: Not on file   Highest education level: Not on file  Occupational History   Occupation: Public House Manager: [employer]  Tobacco Use   Smoking status: Former    Current packs/day: 0.00    Average packs/day: 2.0 packs/day for 40.0 years (80.0 ttl pk-yrs)    Types: Cigarettes    Start date: 74    Quit date: 2012    Years since quitting: 13.9    Passive exposure: Never   Smokeless tobacco: Never  Vaping Use   Vaping status: Never Used  Substance and Sexual Activity   Alcohol use: No    Alcohol/week: 0.0 standard drinks of alcohol   Drug use: No   Sexual activity: Not on file  Other Topics Concern   Not on file  Social History Narrative   Not on file   Social Drivers of Health   Financial Resource Strain: Low Risk  (02/15/2024)   Overall Financial Resource Strain  (CARDIA)    Difficulty of Paying Living Expenses: Not hard at all  Food Insecurity: No Food Insecurity (02/15/2024)   Hunger Vital Sign    Worried About Running Out of Food in the Last Year: Never true    Ran Out of Food in the Last Year: Never true  Transportation Needs: No Transportation Needs (02/15/2024)   PRAPARE - Administrator, Civil Service (Medical): No    Lack of Transportation (Non-Medical): No  Physical Activity: Inactive (02/15/2024)   Exercise Vital Sign    Days of Exercise per Week: 0 days    Minutes of Exercise per Session: Not on file  Stress: Stress Concern Present (02/15/2024)   [employer] of Occupational Health - Occupational Stress Questionnaire    Feeling of Stress: To some extent  Social Connections: Unknown (02/15/2024)   Social Connection and Isolation Panel    Frequency of Communication with Friends and Family: Once a week    Frequency of Social Gatherings with Friends and Family: More than three times a week    Attends Religious Services: Patient declined    Database Administrator or Organizations: Patient declined    Attends Banker Meetings: Not on file    Marital Status: Married  Intimate Partner Violence: Not At Risk (07/04/2023)   Humiliation, Afraid, Rape, and Kick questionnaire    Fear of Current or Ex-Partner: No    Emotionally Abused: No    Physically Abused: No    Sexually Abused: No    FAMILY HISTORY: Family History  Problem Relation Age of Onset   Lung cancer Mother        lung   Esophageal cancer Cousin    Colon cancer Neg Hx    Rectal cancer Neg Hx    Stomach cancer Neg Hx     ALLERGIES:  has no known allergies.  MEDICATIONS:  Current Outpatient Medications  Medication Sig Dispense Refill   acetaminophen  (TYLENOL ) 325 MG tablet Take 650 mg by mouth every 6 (six) hours as needed (for pain).     amLODipine  (NORVASC ) 5 MG tablet TAKE 1 TABLET (5 MG TOTAL) BY MOUTH DAILY. 90 tablet 1   aspirin  EC 81  MG tablet Take 81 mg by mouth in the morning.     carvedilol  (COREG ) 3.125 MG tablet TAKE 1 TABLET BY MOUTH TWICE A DAY WITH FOOD 180 tablet 1   cyanocobalamin  (VITAMIN B12) 1000 MCG tablet Take 1 tablet (1,000 mcg total) by mouth daily.  30 tablet 6   diazepam  (VALIUM ) 5 MG tablet TAKE 1 TABLET BY MOUTH EVERY 12 HOURS AS NEEDED FOR ANXIETY. 60 tablet 5   famotidine  (PEPCID ) 20 MG tablet Take 1 tablet (20 mg total) by mouth 2 (two) times daily for 6 days. (Patient taking differently: Take 20 mg by mouth 2 (two) times daily. As needed) 12 tablet 0   ferrous sulfate  325 (65 FE) MG tablet Take 1 tablet (325 mg total) by mouth daily with breakfast. Please take with a source of Vitamin C 90 tablet 3   furosemide  (LASIX ) 20 MG tablet TAKE 3 TABLETS BY MOUTH EVERY MORNING AND TAKE 2 TABLETS BY MOUTH EVERY EVENING 150 tablet 3   ipratropium (ATROVENT ) 0.03 % nasal spray Place 2 sprays into both nostrils every 12 (twelve) hours as needed for rhinitis.     lidocaine -prilocaine  (EMLA ) cream Apply topically once a week. 30 g 0   Magnesium  500 MG TABS Take 500 mg by mouth every morning.     meclizine  (ANTIVERT ) 25 MG tablet Take 1 tablet (25 mg total) by mouth 3 (three) times daily as needed for dizziness. 30 tablet 0   mirtazapine  (REMERON ) 7.5 MG tablet TAKE 1 TABLET BY MOUTH AT BEDTIME. 90 tablet 1   multivitamin-iron-minerals-folic acid  (CENTRUM) chewable tablet Chew 1 tablet by mouth daily.     nitroGLYCERIN  (NITROSTAT ) 0.4 MG SL tablet Place 0.4 mg under the tongue every 5 (five) minutes as needed for chest pain.     ondansetron  (ZOFRAN ) 8 MG tablet Take 1 tablet (8 mg total) by mouth every 8 (eight) hours as needed. 30 tablet 0   pantoprazole  (PROTONIX ) 40 MG tablet TAKE 1 TABLET (40 MG TOTAL) BY MOUTH TWICE A DAY BEFORE MEALS 180 tablet 1   potassium chloride  SA (KLOR-CON  M20) 20 MEQ tablet Take 1 tablet (20 mEq total) by mouth daily. (Patient taking differently: Take 20 mEq by mouth 2 (two) times daily.)      prochlorperazine  (COMPAZINE ) 10 MG tablet Take 1 tablet (10 mg total) by mouth every 6 (six) hours as needed for nausea or vomiting. 30 tablet 0   rosuvastatin  (CRESTOR ) 20 MG tablet Take 1 tablet (20 mg total) by mouth daily. 90 tablet 3   temazepam  (RESTORIL ) 30 MG capsule Take 1 capsule (30 mg total) by mouth at bedtime as needed for sleep. 90 capsule 1   traMADol  (ULTRAM ) 50 MG tablet Take 1 tablet (50 mg total) by mouth every 6 (six) hours as needed. 30 tablet 0   venlafaxine  XR (EFFEXOR -XR) 150 MG 24 hr capsule Take 1 capsule (150 mg total) by mouth daily with breakfast. 90 capsule 3   No current facility-administered medications for this visit.   Facility-Administered Medications Ordered in Other Visits  Medication Dose Route Frequency Provider Last Rate Last Admin   iohexol  (OMNIPAQUE ) 300 MG/ML solution 50 mL  50 mL Per Tube Once PRN Mugweru, Jon, MD       lidocaine  (XYLOCAINE ) 1 % (with pres) injection 20 mL  20 mL Intradermal Once Mugweru, Jon, MD        REVIEW OF SYSTEMS:   Constitutional: ( - ) fevers, ( - )  chills , ( - ) night sweats Eyes: ( - ) blurriness of vision, ( - ) double vision, ( - ) watery eyes Ears, nose, mouth, throat, and face: ( - ) mucositis, ( - ) sore throat Respiratory: ( - ) cough, ( - ) dyspnea, ( - ) wheezes Cardiovascular: ( - )  palpitation, ( - ) chest discomfort, ( - ) lower extremity swelling Gastrointestinal:  ( - ) nausea, ( - ) heartburn, ( - ) change in bowel habits Skin: ( - ) abnormal skin rashes Lymphatics: ( - ) new lymphadenopathy, ( - ) easy bruising Neurological: ( - ) numbness, ( - ) tingling, ( - ) new weaknesses Behavioral/Psych: ( - ) mood change, ( - ) new changes  All other systems were reviewed with the patient and are negative.  PHYSICAL EXAMINATION: ECOG PERFORMANCE STATUS: 1 - Symptomatic but completely ambulatory  There were no vitals filed for this visit.   There were no vitals filed for this visit.   Day  15, Cycle 6 04/03/24  Temp 98.1 F (36.7 C)  Temp src Oral  Pulse 57 !  Resp 16  BP 120/63   GENERAL: well appearing elderly Caucasian male in NAD  SKIN: skin color, texture, turgor are normal, no rashes or significant lesions EYES: conjunctiva are pink and non-injected, sclera clear LUNGS: clear to auscultation and percussion with normal breathing effort HEART: regular rate & rhythm and no murmurs and no lower extremity edema Musculoskeletal: no cyanosis of digits and no clubbing.  PSYCH: alert & oriented x 3, fluent speech NEURO: no focal motor/sensory deficits  LABORATORY DATA:  I have reviewed the data as listed    Latest Ref Rng & Units 04/03/2024    9:41 AM 03/28/2024   12:34 PM 03/20/2024   11:14 AM  CBC  WBC 4.0 - 10.5 K/uL 4.3  4.2  4.9   Hemoglobin 13.0 - 17.0 g/dL 8.1  8.4  8.6   Hematocrit 39.0 - 52.0 % 25.2  26.7  27.7   Platelets 150 - 400 K/uL 96  94  123        Latest Ref Rng & Units 04/03/2024    9:41 AM 03/28/2024   12:34 PM 03/20/2024   11:14 AM  CMP  Glucose 70 - 99 mg/dL 865  866  886   BUN 8 - 23 mg/dL 30  20  23    Creatinine 0.61 - 1.24 mg/dL 7.81  8.36  8.17   Sodium 135 - 145 mmol/L 137  137  137   Potassium 3.5 - 5.1 mmol/L 4.1  4.4  4.5   Chloride 98 - 111 mmol/L 104  104  104   CO2 22 - 32 mmol/L 24  25  25    Calcium  8.9 - 10.3 mg/dL 8.5  8.6  8.7   Total Protein 6.5 - 8.1 g/dL 7.1  7.1  7.1   Total Bilirubin 0.0 - 1.2 mg/dL 0.5  0.5  0.5   Alkaline Phos 38 - 126 U/L 123  112  119   AST 15 - 41 U/L 30  25  25    ALT 0 - 44 U/L 15  12  16      RADIOGRAPHIC STUDIES: No results found.    ASSESSMENT & PLAN CORAN DIPAOLA is a 68 y.o. 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  After review of the labs, review of the records, and discussion with the patient the patients findings are most consistent with BCG unresponsive high risk non-muscle  invasive bladder cancer, not a candidate for cystectomy.  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 21 days until progression or intolerance up  to 24 months. --Received 8 cycles of Pembrolizumab  on 08/12/2022-01/13/2023: Cycle 8 of Pembrolizumab   --On 03/03/2023, underwent Cystectomy/Prostatectomy showed infiltrative high-grade urothelial carcinoma, size 5.8 cm involving bladder dome, anterior wall and prostatic stroma. Tumor invades directly into prostatic stroma at apex and mid portion of the gland (pT4a). Metastatic urothelial cancer in one of two left common iliac lymph nodes.  -- Given the results of the pathology showing positive margins and spread to the lymph node we are considering radiation therapy versus chemotherapy.  For chemotherapy, recommend gemcitabine  and cisplatin x 4 cycles.  This would consist of gemcitabine  1000 mg/m on days 1 and 8 and cisplatin 70 mg/m on day 1 of 21-day cycle for 4 cycles. -- Due to kidney dysfunction and anemia, chemotherapy was deferred and patient underwent adjuvant radiation to the surgical bed from 06/19/2023 until 07/27/2023.  He received 50.4 Gray in 28 fractions. PLAN:  --Today is Cycle 6, Day 15 of Enfortumab therapy --Labs from today show WBC 4.3, Hgb 8.1, Plt 96K, creatinine 2.18, LFTs normal --Due for CT CAP in Apr 15, 2024. Last CT scan on 01/22/2024 showed response to treatment with no evidence of progression.  --Proceed with treatment today without any dose modifications.  --RTC weekly for treatment and every 2 weeks for clinic visit.  # Skin Rash--resolved -- Occurred after receiving blood transfusion, etiology not entirely clear. -- Patient has been applying moisturizer to the skin and has been taking Benadryl . -- Skin is persistently dry and itchy and erythematous. -- Completed steroid taper with 60 mg for 5 days, 40 for 5 days,  and 20 for 5 days.  #Anemia -Labs today show Hgb is 8.1, overall stable.  --May be multifactorial due to nutritional deficiency as well as kidney dysfunction. -- Patient is currently taking vitamin B12 p.o. daily and ferrous sulfate  every other day. -- Receives vitamin B12 1000 mcg injection monthly, last received on 03/28/2024 -- Last received IV monoferric  1000 mg x 1 dose on 08/25/2023.  -- Most recent labs from 01/25/2024 show no evidence of iron or B12 deficiency.   #Hypokalemia: --Potassium level is 4.1 today --Patient is currently taking potassium supplement 20 meq BID. Advised to continue  #Flank pain--improved: --Secondary to bilateral nephrostomy. Patient is currently taking tylenol  with minimal improvement.  --Has prescription for tramadol  50 mg PO q 6 hours as needed.   # AKI -- Creatinine increased to 2.18 today. He is scheduled for nephrostomy exchange later today.  --Continue to monitor his creatinine with each visit. SABRA   #Supportive Care -- chemotherapy education complete -- port placed -- zofran  8mg  q8H PRN and compazine  10mg  PO q6H for nausea -- EMLA  cream for port -- no pain medication required at this time.    No orders of the defined types were placed in this encounter.  All questions were answered. The patient knows to call the clinic with any problems, questions or concerns.  I have spent a total of 30 minutes minutes of face-to-face and non-face-to-face time, preparing to see the patient, performing a medically appropriate examination, counseling and educating the patient, documenting clinical information in the electronic health record,and care coordination.   Johnston Police PA-C Dept of Hematology and Oncology [facility] Phone: [phone]

## 2024-04-04 ENCOUNTER — Inpatient Hospital Stay

## 2024-04-04 ENCOUNTER — Inpatient Hospital Stay: Admitting: Hematology and Oncology

## 2024-04-05 ENCOUNTER — Other Ambulatory Visit: Payer: Self-pay

## 2024-04-11 ENCOUNTER — Telehealth: Payer: Self-pay | Admitting: *Deleted

## 2024-04-11 NOTE — Telephone Encounter (Signed)
 Received call from opt's wife. She states that Luis [name] has been c/o worsening itching all over: arms, legs, back , chest, abdomen. She states there is a faint red rash in these areas. He has tried lotions but that does not seem to help. She states he needs some relief. He has not changed anything in his environment-soaps, lotion, laundry detergent. No new medications. Please advise

## 2024-04-11 NOTE — Telephone Encounter (Signed)
 TCT pt's wife. Spoke with her. Advised that Dr. Federico states it is likely related to his chemo and  recommends Eucerin cream/lotion during the day and at night before he goes to bed.  Also advised to try Benadryl  @ night.  Sharman states that he does that already. She will get some of the Eurcerin product and see how that works. She is aware of his appts next week.

## 2024-04-15 ENCOUNTER — Ambulatory Visit (HOSPITAL_COMMUNITY)
Admission: RE | Admit: 2024-04-15 | Discharge: 2024-04-15 | Disposition: A | Discharge status: Home / Self Care | Source: Ambulatory Visit | Attending: Hematology and Oncology | Admitting: Hematology and Oncology

## 2024-04-15 DIAGNOSIS — C679 Malignant neoplasm of bladder, unspecified: Secondary | ICD-10-CM | POA: Insufficient documentation

## 2024-04-15 LAB — POCT I-STAT CREATININE: Creatinine, Ser: 2 mg/dL — ABNORMAL HIGH (ref 0.61–1.24)

## 2024-04-15 MED ORDER — IOHEXOL 300 MG/ML  SOLN
80.0000 mL | Freq: Once | INTRAMUSCULAR | Status: AC | PRN
  Administered 2024-04-15: 80 mL via INTRAVENOUS

## 2024-04-17 ENCOUNTER — Telehealth: Payer: Self-pay

## 2024-04-17 ENCOUNTER — Inpatient Hospital Stay

## 2024-04-17 ENCOUNTER — Inpatient Hospital Stay: Admitting: Physician Assistant

## 2024-04-17 VITALS — BP 108/68 | HR 59 | Temp 98.2°F | Resp 16 | Ht 66.0 in | Wt 220.0 lb

## 2024-04-17 DIAGNOSIS — C679 Malignant neoplasm of bladder, unspecified: Secondary | ICD-10-CM

## 2024-04-17 DIAGNOSIS — Z5112 Encounter for antineoplastic immunotherapy: Secondary | ICD-10-CM | POA: Diagnosis not present

## 2024-04-17 DIAGNOSIS — R7989 Other specified abnormal findings of blood chemistry: Secondary | ICD-10-CM | POA: Diagnosis not present

## 2024-04-17 LAB — CMP (CANCER CENTER ONLY)
ALT: 14 U/L (ref 0–44)
AST: 24 U/L (ref 15–41)
Albumin: 3.4 g/dL — ABNORMAL LOW (ref 3.5–5.0)
Alkaline Phosphatase: 130 U/L — ABNORMAL HIGH (ref 38–126)
Anion gap: 10 (ref 5–15)
BUN: 27 mg/dL — ABNORMAL HIGH (ref 8–23)
CO2: 24 mmol/L (ref 22–32)
Calcium: 8.4 mg/dL — ABNORMAL LOW (ref 8.9–10.3)
Chloride: 102 mmol/L (ref 98–111)
Creatinine: 2.43 mg/dL — ABNORMAL HIGH (ref 0.61–1.24)
GFR, Estimated: 28 mL/min — ABNORMAL LOW
Glucose, Bld: 147 mg/dL — ABNORMAL HIGH (ref 70–99)
Potassium: 3.7 mmol/L (ref 3.5–5.1)
Sodium: 136 mmol/L (ref 135–145)
Total Bilirubin: 0.7 mg/dL (ref 0.0–1.2)
Total Protein: 7.2 g/dL (ref 6.5–8.1)

## 2024-04-17 LAB — CBC WITH DIFFERENTIAL (CANCER CENTER ONLY)
Abs Immature Granulocytes: 0.01 K/uL (ref 0.00–0.07)
Basophils Absolute: 0 K/uL (ref 0.0–0.1)
Basophils Relative: 0 %
Eosinophils Absolute: 0.1 K/uL (ref 0.0–0.5)
Eosinophils Relative: 1 %
HCT: 25.4 % — ABNORMAL LOW (ref 39.0–52.0)
Hemoglobin: 8.3 g/dL — ABNORMAL LOW (ref 13.0–17.0)
Immature Granulocytes: 0 %
Lymphocytes Relative: 8 %
Lymphs Abs: 0.4 K/uL — ABNORMAL LOW (ref 0.7–4.0)
MCH: 30.6 pg (ref 26.0–34.0)
MCHC: 32.7 g/dL (ref 30.0–36.0)
MCV: 93.7 fL (ref 80.0–100.0)
Monocytes Absolute: 0.8 K/uL (ref 0.1–1.0)
Monocytes Relative: 15 %
Neutro Abs: 4 K/uL (ref 1.7–7.7)
Neutrophils Relative %: 76 %
Platelet Count: 130 K/uL — ABNORMAL LOW (ref 150–400)
RBC: 2.71 MIL/uL — ABNORMAL LOW (ref 4.22–5.81)
RDW: 19.2 % — ABNORMAL HIGH (ref 11.5–15.5)
WBC Count: 5.3 K/uL (ref 4.0–10.5)
nRBC: 0 % (ref 0.0–0.2)

## 2024-04-17 MED ORDER — SODIUM CHLORIDE 0.9 % IV SOLN: Freq: Once | INTRAVENOUS | Status: AC

## 2024-04-17 MED ORDER — SODIUM CHLORIDE 0.9 % IV SOLN
125.0000 mg | Freq: Once | INTRAVENOUS | Status: AC
  Administered 2024-04-17: 125 mg via INTRAVENOUS
  Filled 2024-04-17: qty 8.5

## 2024-04-17 MED ORDER — SODIUM CHLORIDE 0.9 % IV SOLN: INTRAVENOUS | Status: DC

## 2024-04-17 MED ORDER — PROCHLORPERAZINE MALEATE 10 MG PO TABS
10.0000 mg | ORAL_TABLET | Freq: Once | ORAL | Status: AC
  Administered 2024-04-17: 10 mg via ORAL
  Filled 2024-04-17: qty 1

## 2024-04-17 NOTE — Patient Instructions (Signed)
 [facility] - A DEPT OF [facility]  Discharge Instructions: Thank you for choosing [facility] to provide your oncology and hematology care.   If you have a lab appointment with the Cancer Center, please go directly to the Cancer Center and check in at the registration area.   Wear comfortable clothing and clothing appropriate for easy access to any Portacath or PICC line.   We strive to give you quality time with your provider. You may need to reschedule your appointment if you arrive late (15 or more minutes).  Arriving late affects you and other patients whose appointments are after yours.  Also, if you miss three or more appointments without notifying the office, you may be dismissed from the clinic at the provider's discretion.      For prescription refill requests, have your pharmacy contact our office and allow 72 hours for refills to be completed.    Today you received the following chemotherapy and/or immunotherapy agents: Enfortumab vedotin -ejfv (Padcev )    To help prevent nausea and vomiting after your treatment, we encourage you to take your nausea medication as directed.  BELOW ARE SYMPTOMS THAT SHOULD BE REPORTED IMMEDIATELY: *FEVER GREATER THAN 100.4 F (38 C) OR HIGHER *CHILLS OR SWEATING *NAUSEA AND VOMITING THAT IS NOT CONTROLLED WITH YOUR NAUSEA MEDICATION *UNUSUAL SHORTNESS OF BREATH *UNUSUAL BRUISING OR BLEEDING *URINARY PROBLEMS (pain or burning when urinating, or frequent urination) *BOWEL PROBLEMS (unusual diarrhea, constipation, pain near the anus) TENDERNESS IN MOUTH AND THROAT WITH OR WITHOUT PRESENCE OF ULCERS (sore throat, sores in mouth, or a toothache) UNUSUAL RASH, SWELLING OR PAIN  UNUSUAL VAGINAL DISCHARGE OR ITCHING   Items with * indicate a potential emergency and should be followed up as soon as possible or go to the Emergency Department if any problems should occur.  Please show the CHEMOTHERAPY ALERT  CARD or IMMUNOTHERAPY ALERT CARD at check-in to the Emergency Department and triage nurse.  Should you have questions after your visit or need to cancel or reschedule your appointment, please contact [facility] - A DEPT OF [facility]  Dept: [phone]  and follow the prompts.  Office hours are 8:00 a.m. to 4:30 p.m. Monday - Friday. Please note that voicemails left after 4:00 p.m. may not be returned until the following business day.  We are closed weekends and major holidays. You have access to a nurse at all times for urgent questions. Please call the main number to the clinic Dept: [phone] and follow the prompts.   For any non-urgent questions, you may also contact your provider using MyChart. We now offer e-Visits for anyone 39 and older to request care online for non-urgent symptoms. For details visit [email or web address].   Also download the MyChart app! Go to the app store, search MyChart, open the app, select [facility], and log in with your MyChart username and password.

## 2024-04-17 NOTE — Telephone Encounter (Signed)
 TC to radiology reading room to request results expedited of scan done [facility] 04/15/24  for [facility]

## 2024-04-17 NOTE — Progress Notes (Signed)
 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 Federico  08/12/2022: Cycle 1 of Pembrolizumab  09/02/2022: Cycle 2 of Pembrolizumab   09/23/2022: Cycle 3 of Pembrolizumab  10/14/2022: Cycle 4 of Pembrolizumab  11/04/2022: Cycle 5 of Pembrolizumab  11/25/2022: Cycle 6 of Pembrolizumab  12/20/2022: Cycle 7 of Pembrolizumab  01/13/2023: Cycle 8 of Pembrolizumab   03/03/2023: Cystectomy/Prostatectomy showed infiltrative high-grade urothelial carcinoma, size 5.8 cm involving bladder dome, anterior wall and prostatic stroma. Tumor invades directly into prostatic stroma at apex and mid portion of the gland (pT4a). Metastatic urothelial cancer in one of two left common iliac lymph nodes.  06/19/2023-07/27/2023: Received adjuvant radiation to surgical bed.  He received 50.4 Gray in 28 fractions. 11/02/2023-Present: Started Cycle 1 Day 1 of Enfortumab.   CHIEF COMPLAINTS/PURPOSE OF CONSULTATION:  High Risk Non-Muscle Invasive Bladder Cancer   HISTORY OF PRESENTING ILLNESS:  Agent SAUNDERS Stain 68 y.o. male with medical history significant for BCG unresponsive high risk nonmuscle invasive  bladder cancer who presents for a follow up visit.  He was last seen on 04/03/2024.  He presents today to continue Enfortumab treatment.   On exam today Mr. Xiong reports his his energy levels have declined since last visit.  He is sleeping more often especially throughout the day.  He reports decreased appetite.  With his weight fluctuating over the last several weeks.  He contributes the weight loss to improve lower extremity edema.  He denies nausea, vomiting or bowel habit changes.  He denies overt signs of bleeding including hematochezia or melena.  His nephrostomy tubes are functioning well with no bleeding or clogs.  He denies fevers, chills, sweats, shortness of breath, chest pain or cough. A full 10 point ROS is otherwise negative.  He is willing and able to proceed with treatment today.  MEDICAL HISTORY:  Past Medical History:  Diagnosis Date   Anxiety    takes Valium  as needed   Aortic stenosis, moderate    Arthritis    Ascending aortic aneurysm    CAD (coronary artery disease)    a. s/p PCI of the RCA 8/12 with DES by Dr Wonda, preserved EF. b. LHC/RHC (2/16) with mean RA 12, PA 32/15, mean PCWP 18, CI 3.47; patent mid and distal RCA stents, 50-60% proximal stenosis small PDA.      Cancer [facility])    bladder   Carotid stenosis    a. Carotid US  (05/2013):  Bilateral 1-39% ICA; L thyroid  nodule (prior hx of aspiration).   Chronic diastolic CHF (congestive heart failure) (HCC)    Complication of anesthesia    difficulty waking up after gallbladder surgery   Depression    Dyspnea    Essential hypertension    GERD (gastroesophageal reflux disease)    if needed will take OTC meds    Heart murmur  History of colonic polyps    hyperplastic   Hyperlipidemia    Joint pain    Lesion of bladder    Myocardial infarction (HCC) 2012   Obesity (BMI 30-39.9) 02/29/2016   Pre-diabetes    Restless leg    Sleep apnea    uses cpap   Tubular adenoma of colon    Vertigo    takes  Meclizine  as needed    SURGICAL HISTORY: Past Surgical History:  Procedure Laterality Date   AORTIC VALVE REPLACEMENT N/A 09/16/2021   Procedure: AORTIC VALVE REPLACEMENT (AVR);  Surgeon: Lucas Dorise POUR, MD;  Location: [facility] OR;  Service: Open Heart Surgery;  Laterality: N/A;   CATARACT EXTRACTION   4 YRS AGO   BOTH EYES   CHOLECYSTECTOMY  07/21/2011   Procedure: LAPAROSCOPIC CHOLECYSTECTOMY WITH INTRAOPERATIVE CHOLANGIOGRAM;  Surgeon: Alm VEAR Angle, MD;  Location: [facility] ORS;  Service: General;  Laterality: N/A;   CORONARY ANGIOPLASTY  2012   2 stents   coronary stenting     s/p PCI of the RCA by Dr Wonda 8/12 with 2 promus stents   CYSTOSCOPY W/ RETROGRADES Bilateral 12/13/2019   Procedure: CYSTOSCOPY WITH RETROGRADE PYELOGRAM;  Surgeon: Alvaro Hummer, MD;  Location: [facility];  Service: Urology;  Laterality: Bilateral;   CYSTOSCOPY W/ RETROGRADES Bilateral 10/14/2020   Procedure: CYSTOSCOPY WITH RETROGRADE PYELOGRAM;  Surgeon: Alvaro Hummer, MD;  Location: [facility];  Service: Urology;  Laterality: Bilateral;   CYSTOSCOPY W/ RETROGRADES Bilateral 12/16/2020   Procedure: CYSTOSCOPY WITH RETROGRADE PYELOGRAM;  Surgeon: Alvaro Hummer, MD;  Location: [facility];  Service: Urology;  Laterality: Bilateral;   CYSTOSCOPY W/ RETROGRADES Bilateral 06/03/2022   Procedure: CYSTOSCOPY WITH RETROGRADE PYELOGRAM;  Surgeon: Alvaro Hummer, MD;  [facility]: [facility];  Service: Urology;  Laterality: Bilateral;   CYSTOSCOPY W/ RETROGRADES Bilateral 12/28/2022   Procedure: CYSTOSCOPY WITH RETROGRADE PYELOGRAM, FULGARATION OF BLEEDERS;  Surgeon: Alvaro Hummer KATHEE Mickey., MD;  Location: [facility];  Service: Urology;  Laterality: Bilateral;   CYSTOSCOPY WITH INJECTION N/A 03/03/2023   Procedure: CYSTOSCOPY WITH INDOCYANINE INJECTION;  Surgeon: Alvaro Hummer KATHEE Mickey., MD;  Location: [facility];  Service: Urology;  Laterality: N/A;  360 MINUTES   ESOPHAGOGASTRODUODENOSCOPY N/A  06/28/2023   Procedure: EGD (ESOPHAGOGASTRODUODENOSCOPY);  Surgeon: Charlanne Groom, MD;  Location: [facility];  Service: Gastroenterology;  Laterality: N/A;   IR IMAGING GUIDED PORT INSERTION  05/29/2023   IR NEPHROSTOMY EXCHANGE LEFT  08/07/2023   IR NEPHROSTOMY EXCHANGE LEFT  10/02/2023   IR NEPHROSTOMY EXCHANGE LEFT  11/27/2023   IR NEPHROSTOMY EXCHANGE LEFT  01/10/2024   IR NEPHROSTOMY EXCHANGE LEFT  02/21/2024   IR NEPHROSTOMY EXCHANGE LEFT  04/03/2024   IR NEPHROSTOMY PLACEMENT RIGHT  07/01/2023   IR RADIOLOGY PERIPHERAL GUIDED IV START  07/01/2023   IR US  GUIDE VASC ACCESS LEFT  07/01/2023   LEFT AND RIGHT HEART CATHETERIZATION WITH CORONARY ANGIOGRAM N/A 06/23/2014   Procedure: LEFT AND RIGHT HEART CATHETERIZATION WITH CORONARY ANGIOGRAM;  Surgeon: Ezra GORMAN Shuck, MD;  Location: [facility] CATH LAB;  Service: Cardiovascular;  Laterality: N/A;   NECK SURGERY  03/23/09   per Dr. Barbarann, cervical fusion    PERICARDIOCENTESIS N/A 09/28/2021   Procedure: PERICARDIOCENTESIS;  Surgeon: Dann Candyce GORMAN, MD;  Location: [facility] INVASIVE CV LAB;  Service: Cardiovascular;  Laterality: N/A;   REPLACEMENT ASCENDING AORTA N/A 09/16/2021   Procedure: REPLACEMENT ASCENDING AORTA WITH 30 X HEMASHIELD PLATINUM WOVEN DOUBLE VELOUR VASCULAR GRAFT;  Surgeon: Lucas Dorise POUR, MD;  Location: [facility] OR;  Service: Open Heart Surgery;  Laterality: N/A;  CIRC ARREST   right elbow surgery     RIGHT HEART CATH N/A 06/15/2023   Procedure: RIGHT HEART CATH;  Surgeon: Rolan Ezra RAMAN, MD;  Location: [facility] INVASIVE CV LAB;  Service: Cardiovascular;  Laterality: N/A;   RIGHT HEART CATH AND CORONARY ANGIOGRAPHY N/A 07/08/2021   Procedure: RIGHT HEART CATH AND CORONARY ANGIOGRAPHY;  Surgeon: Rolan Ezra RAMAN, MD;  Location: [facility] INVASIVE CV LAB;  Service: Cardiovascular;  Laterality: N/A;   ROBOT ASSISTED LAPAROSCOPIC COMPLETE CYSTECT ILEAL CONDUIT N/A 03/03/2023   Procedure: XI ROBOTIC ASSISTED LAPAROSCOPIC COMPLETE CYSTECTECTOMY WITH  ILEAL CONDUIT  DIVERSION;  Surgeon: Alvaro Ricardo KATHEE Mickey., MD;  Location: [facility];  Service: Urology;  Laterality: N/A;   ROBOT ASSISTED LAPAROSCOPIC RADICAL PROSTATECTOMY N/A 03/03/2023   Procedure: XI ROBOTIC ASSISTED LAPAROSCOPIC RADICAL PROSTATECTOMY WITH LYMPH NODE DISSECTION;  Surgeon: Alvaro Ricardo KATHEE Mickey., MD;  Location: [facility];  Service: Urology;  Laterality: N/A;   solonscopy  05/23/08   per Dr. Jakie inch hemorrhoids only, repeat in 5 years   SUBXYPHOID PERICARDIAL WINDOW N/A 09/28/2021   Procedure: SUBXYPHOID PERICARDIAL WINDOW;  Surgeon: Lucas Dorise POUR, MD;  Location: [facility];  Service: Thoracic;  Laterality: N/A;   TEE WITHOUT CARDIOVERSION N/A 06/23/2014   Procedure: TRANSESOPHAGEAL ECHOCARDIOGRAM (TEE);  Surgeon: Ezra RAMAN Rolan, MD;  Location: [facility] ENDOSCOPY;  Service: Cardiovascular;  Laterality: N/A;   TEE WITHOUT CARDIOVERSION N/A 01/21/2016   Procedure: TRANSESOPHAGEAL ECHOCARDIOGRAM (TEE);  Surgeon: Ezra RAMAN Rolan, MD;  Location: [facility] ENDOSCOPY;  Service: Cardiovascular;  Laterality: N/A;   TEE WITHOUT CARDIOVERSION N/A 09/16/2021   Procedure: TRANSESOPHAGEAL ECHOCARDIOGRAM (TEE);  Surgeon: Lucas Dorise POUR, MD;  Location: [facility] OR;  Service: Open Heart Surgery;  Laterality: N/A;   TEE WITHOUT CARDIOVERSION N/A 09/28/2021   Procedure: TRANSESOPHAGEAL ECHOCARDIOGRAM (TEE);  Surgeon: Lucas Dorise POUR, MD;  Location: [facility] OR;  Service: Thoracic;  Laterality: N/A;   TONSILLECTOMY     TRANSESOPHAGEAL ECHOCARDIOGRAM (CATH LAB) N/A 04/17/2023   Procedure: TRANSESOPHAGEAL ECHOCARDIOGRAM;  Surgeon: Jeffrie Oneil BROCKS, MD;  Location: [facility] INVASIVE CV LAB;  Service: Cardiovascular;  Laterality: N/A;   TRANSURETHRAL RESECTION OF BLADDER TUMOR N/A 10/21/2019   Procedure: TRANSURETHRAL RESECTION OF BLADDER TUMOR (TURBT);  Surgeon: Ottelin, Mark, MD;  Location: [facility];  Service: Urology;  Laterality: N/A;   TRANSURETHRAL RESECTION OF BLADDER TUMOR N/A 12/13/2019   Procedure: TRANSURETHRAL RESECTION OF  BLADDER TUMOR (TURBT);  Surgeon: Alvaro Ricardo, MD;  Location: [facility];  Service: Urology;  Laterality: N/A;  1 HR   TRANSURETHRAL RESECTION OF BLADDER TUMOR N/A 10/14/2020   Procedure: TRANSURETHRAL RESECTION OF BLADDER TUMOR (TURBT);  Surgeon: Alvaro Ricardo, MD;  Location: [facility];  Service: Urology;  Laterality: N/A;   TRANSURETHRAL RESECTION OF BLADDER TUMOR N/A 12/16/2020   Procedure: RESTAGING TRANSURETHRAL RESECTION OF BLADDER TUMOR (TURBT);  Surgeon: Alvaro Ricardo, MD;  Location: [facility];  Service: Urology;  Laterality: N/A;   TRANSURETHRAL RESECTION OF BLADDER TUMOR N/A 06/03/2022   Procedure: TRANSURETHRAL RESECTION OF BLADDER TUMOR (TURBT);  Surgeon: Alvaro Ricardo, MD;  Location[facility];  Service: Urology;  Laterality: N/A;   UMBILICAL HERNIA REPAIR  03/03/2023   Procedure: HERNIA REPAIR UMBILICAL;  Surgeon: Alvaro Ricardo KATHEE Mickey., MD;  Location: [facility];  Service: Urology;;    SOCIAL HISTORY: Social History   Socioeconomic History   Marital status: Married    Spouse name: Not on file  Number of children: 4   Years of education: Not on file   Highest education level: Not on file  Occupational History   Occupation: Public House Manager: [employer]  Tobacco Use   Smoking status: Former    Current packs/day: 0.00    Average packs/day: 2.0 packs/day for 40.0 years (80.0 ttl pk-yrs)    Types: Cigarettes    Start date: 98    Quit date: 2012    Years since quitting: 13.9    Passive exposure: Never   Smokeless tobacco: Never  Vaping Use   Vaping status: Never Used  Substance and Sexual Activity   Alcohol use: No    Alcohol/week: 0.0 standard drinks of alcohol   Drug use: No   Sexual activity: Not on file  Other Topics Concern   Not on file  Social History Narrative   Not on file   Social Drivers of Health   Tobacco Use: Medium Risk (02/05/2024)   Patient History    Smoking Tobacco  Use: Former    Smokeless Tobacco Use: Never    Passive Exposure: Never  Physicist, Medical Strain: Low Risk (02/15/2024)   Overall Financial Resource Strain (CARDIA)    Difficulty of Paying Living Expenses: Not hard at all  Food Insecurity: No Food Insecurity (02/15/2024)   Epic    Worried About Radiation Protection Practitioner of Food in the Last Year: Never true    Ran Out of Food in the Last Year: Never true  Transportation Needs: No Transportation Needs (02/15/2024)   Epic    Lack of Transportation (Medical): No    Lack of Transportation (Non-Medical): No  Physical Activity: Inactive (02/15/2024)   Exercise Vital Sign    Days of Exercise per Week: 0 days    Minutes of Exercise per Session: Not on file  Stress: Stress Concern Present (02/15/2024)   [employer] of Occupational Health - Occupational Stress Questionnaire    Feeling of Stress: To some extent  Social Connections: Unknown (02/15/2024)   Social Connection and Isolation Panel    Frequency of Communication with Friends and Family: Once a week    Frequency of Social Gatherings with Friends and Family: More than three times a week    Attends Religious Services: Patient declined    Database Administrator or Organizations: Patient declined    Attends Banker Meetings: Not on file    Marital Status: Married  Intimate Partner Violence: Not At Risk (07/04/2023)   Humiliation, Afraid, Rape, and Kick questionnaire    Fear of Current or Ex-Partner: No    Emotionally Abused: No    Physically Abused: No    Sexually Abused: No  Depression (PHQ2-9): Low Risk (03/20/2024)   Depression (PHQ2-9)    PHQ-2 Score: 0  Alcohol Screen: Low Risk (05/09/2023)   Alcohol Screen    Last Alcohol Screening Score (AUDIT): 0  Housing: Low Risk (02/15/2024)   Epic    Unable to Pay for Housing in the Last Year: No    Number of Times Moved in the Last Year: 0    Homeless in the Last Year: No  Utilities: Not At Risk (07/04/2023)   AHC Utilities     Threatened with loss of utilities: No  Health Literacy: Adequate Health Literacy (11/03/2022)   B1300 Health Literacy    Frequency of need for help with medical instructions: Rarely    FAMILY HISTORY: Family History  Problem Relation Age of Onset   Lung cancer  Mother        lung   Esophageal cancer Cousin    Colon cancer Neg Hx    Rectal cancer Neg Hx    Stomach cancer Neg Hx     ALLERGIES:  has no known allergies.  MEDICATIONS:  Current Outpatient Medications  Medication Sig Dispense Refill   acetaminophen  (TYLENOL ) 325 MG tablet Take 650 mg by mouth every 6 (six) hours as needed (for pain).     amLODipine  (NORVASC ) 5 MG tablet TAKE 1 TABLET (5 MG TOTAL) BY MOUTH DAILY. 90 tablet 1   aspirin  EC 81 MG tablet Take 81 mg by mouth in the morning.     carvedilol  (COREG ) 3.125 MG tablet TAKE 1 TABLET BY MOUTH TWICE A DAY WITH FOOD 180 tablet 1   cyanocobalamin  (VITAMIN B12) 1000 MCG tablet Take 1 tablet (1,000 mcg total) by mouth daily. 30 tablet 6   diazepam  (VALIUM ) 5 MG tablet TAKE 1 TABLET BY MOUTH EVERY 12 HOURS AS NEEDED FOR ANXIETY. 60 tablet 5   famotidine  (PEPCID ) 20 MG tablet Take 1 tablet (20 mg total) by mouth 2 (two) times daily for 6 days. (Patient taking differently: Take 20 mg by mouth 2 (two) times daily. As needed) 12 tablet 0   ferrous sulfate  325 (65 FE) MG tablet Take 1 tablet (325 mg total) by mouth daily with breakfast. Please take with a source of Vitamin C 90 tablet 3   furosemide  (LASIX ) 20 MG tablet TAKE 3 TABLETS BY MOUTH EVERY MORNING AND TAKE 2 TABLETS BY MOUTH EVERY EVENING 150 tablet 3   ipratropium (ATROVENT ) 0.03 % nasal spray Place 2 sprays into both nostrils every 12 (twelve) hours as needed for rhinitis.     lidocaine -prilocaine  (EMLA ) cream Apply topically once a week. 30 g 0   Magnesium  500 MG TABS Take 500 mg by mouth every morning.     meclizine  (ANTIVERT ) 25 MG tablet Take 1 tablet (25 mg total) by mouth 3 (three) times daily as needed for  dizziness. 30 tablet 0   mirtazapine  (REMERON ) 7.5 MG tablet TAKE 1 TABLET BY MOUTH AT BEDTIME. 90 tablet 1   multivitamin-iron-minerals-folic acid  (CENTRUM) chewable tablet Chew 1 tablet by mouth daily.     nitroGLYCERIN  (NITROSTAT ) 0.4 MG SL tablet Place 0.4 mg under the tongue every 5 (five) minutes as needed for chest pain.     ondansetron  (ZOFRAN ) 8 MG tablet Take 1 tablet (8 mg total) by mouth every 8 (eight) hours as needed. 30 tablet 0   pantoprazole  (PROTONIX ) 40 MG tablet TAKE 1 TABLET (40 MG TOTAL) BY MOUTH TWICE A DAY BEFORE MEALS 180 tablet 1   potassium chloride  SA (KLOR-CON  M20) 20 MEQ tablet Take 1 tablet (20 mEq total) by mouth daily. (Patient taking differently: Take 20 mEq by mouth 2 (two) times daily.)     prochlorperazine  (COMPAZINE ) 10 MG tablet Take 1 tablet (10 mg total) by mouth every 6 (six) hours as needed for nausea or vomiting. 30 tablet 0   rosuvastatin  (CRESTOR ) 20 MG tablet Take 1 tablet (20 mg total) by mouth daily. 90 tablet 3   temazepam  (RESTORIL ) 30 MG capsule Take 1 capsule (30 mg total) by mouth at bedtime as needed for sleep. 90 capsule 1   traMADol  (ULTRAM ) 50 MG tablet Take 1 tablet (50 mg total) by mouth every 6 (six) hours as needed. 30 tablet 0   venlafaxine  XR (EFFEXOR -XR) 150 MG 24 hr capsule Take 1 capsule (150 mg total) by mouth  daily with breakfast. 90 capsule 3   No current facility-administered medications for this visit.   Facility-Administered Medications Ordered in Other Visits  Medication Dose Route Frequency Provider Last Rate Last Admin   0.9 %  sodium chloride  infusion   Intravenous Continuous Federico Norleen ONEIDA MADISON, MD   Stopped at 04/17/24 1219    REVIEW OF SYSTEMS:   Constitutional: ( - ) fevers, ( - )  chills , ( - ) night sweats Eyes: ( - ) blurriness of vision, ( - ) double vision, ( - ) watery eyes Ears, nose, mouth, throat, and face: ( - ) mucositis, ( - ) sore throat Respiratory: ( - ) cough, ( - ) dyspnea, ( - )  wheezes Cardiovascular: ( - ) palpitation, ( - ) chest discomfort, ( - ) lower extremity swelling Gastrointestinal:  ( - ) nausea, ( - ) heartburn, ( - ) change in bowel habits Skin: ( - ) abnormal skin rashes Lymphatics: ( - ) new lymphadenopathy, ( - ) easy bruising Neurological: ( - ) numbness, ( - ) tingling, ( - ) new weaknesses Behavioral/Psych: ( - ) mood change, ( - ) new changes  All other systems were reviewed with the patient and are negative.  PHYSICAL EXAMINATION: ECOG PERFORMANCE STATUS: 1 - Symptomatic but completely ambulatory  There were no vitals filed for this visit.  There were no vitals filed for this visit.  Day 1, Cycle 7 04/17/24  Height 5' 6 (1.676 m)  Weight 220 lb (99.8 kg)  BSA (Calculated - sq m) 2.16 sq meters  Temp 98.2 F (36.8 C)  Temp src Oral  Pulse 59 !  Resp 16  BP 108/68   GENERAL: well appearing elderly Caucasian male in NAD  SKIN: skin color, texture, turgor are normal, no rashes or significant lesions EYES: conjunctiva are pink and non-injected, sclera clear LUNGS: clear to auscultation and percussion with normal breathing effort HEART: regular rate & rhythm and no murmurs and no lower extremity edema Musculoskeletal: no cyanosis of digits and no clubbing.  PSYCH: alert & oriented x 3, fluent speech NEURO: no focal motor/sensory deficits  LABORATORY DATA:  I have reviewed the data as listed    Latest Ref Rng & Units 04/17/2024    9:47 AM 04/03/2024    9:41 AM 03/28/2024   12:34 PM  CBC  WBC 4.0 - 10.5 K/uL 5.3  4.3  4.2   Hemoglobin 13.0 - 17.0 g/dL 8.3  8.1  8.4   Hematocrit 39.0 - 52.0 % 25.4  25.2  26.7   Platelets 150 - 400 K/uL 130  96  94        Latest Ref Rng & Units 04/17/2024    9:47 AM 04/15/2024   12:43 PM 04/03/2024    9:41 AM  CMP  Glucose 70 - 99 mg/dL 852   865   BUN 8 - 23 mg/dL 27   30   Creatinine 9.38 - 1.24 mg/dL 7.56  7.99  7.81   Sodium 135 - 145 mmol/L 136   137   Potassium 3.5 - 5.1 mmol/L  3.7   4.1   Chloride 98 - 111 mmol/L 102   104   CO2 22 - 32 mmol/L 24   24   Calcium  8.9 - 10.3 mg/dL 8.4   8.5   Total Protein 6.5 - 8.1 g/dL 7.2   7.1   Total Bilirubin 0.0 - 1.2 mg/dL 0.7   0.5   Alkaline Phos  38 - 126 U/L 130   123   AST 15 - 41 U/L 24   30   ALT 0 - 44 U/L 14   15     RADIOGRAPHIC STUDIES: IR NEPHROSTOMY EXCHANGE BILATERAL Result Date: 04/03/2024 INDICATION: Routine exchange.  routine 10 f Briefly, 68 year old male with a history of bladder cancer s/p cystectomy and ARF requiring nephrostomies placed 07/01/2023. Last exchange 02/21/2024. EXAM: BILATERAL NEPHROSTOMY CATHETER EXCHANGE COMPARISON:  IR fluoroscopy, 02/21/2024.  CT CAP, 01/22/2024. CONTRAST:  A total of 20 mL Isovue -300 administered was administered into both collecting systems FLUOROSCOPY: Radiation Exposure Index and estimated peak skin dose (PSD); Reference air kerma (RAK), 16 mGy. COMPLICATIONS: None immediate. TECHNIQUE: Informed written consent was obtained from the patient after a discussion of the risks, benefits and alternatives to treatment. Questions regarding the procedure were encouraged and answered. A timeout was performed prior to the initiation of the procedure. The flanks and external portions of existing nephrostomy catheters were prepped and draped in the usual sterile fashion. A sterile drape was applied covering the operative field. Maximum barrier sterile technique with sterile gowns and gloves were used for the procedure. A timeout was performed prior to the initiation of the procedure. A pre procedural spot fluoroscopic image was obtained. Beginning with the LEFT nephrostomy, a small amount of contrast was injected via the existing catheter demonstrating appropriate positioning within the renal pelvis. The existing nephrostomy catheter was cut and cannulated with a Benson wire which was coiled within the renal pelvis. Under intermittent fluoroscopic guidance, the existing nephrostomy catheter  was exchanged for a new 10 Fr drainage catheter. Limited contrast injection confirmed appropriate positioning within the LEFT renal pelvis and a post exchange fluoroscopic image was obtained. The catheter was locked, secured to the skin with an interrupted suture and reconnected to a gravity bag. The procedure was repeated for the contralateral, RIGHT-sided nephrostomy, ultimately allowing successful exchange of a new 10 Fr drainage catheter with end coiled and locked within the renal pelvis. Dressings were placed. The patient tolerated the above procedures well without immediate postprocedural complication. FINDINGS: The existing nephrostomy catheters are appropriately positioned and functioning. After successful fluoroscopic guided exchange, new bilateral 10 Fr nephrostomy catheters are coiled and locked within the respective renal pelves. IMPRESSION: Successful fluoroscopic guided exchange of bilateral 10 Fr percutaneous nephrostomy catheters. RECOMMENDATIONS: The patient will return to Vascular Interventional Radiology (VIR) for routine drainage catheter evaluation and exchange in 8-10 weeks. Patient with ileal conduit and stoma for urinary diversion, consider retrograde nephroureteral catheters. A discussion will be initiated with his urologist. Thom Hall, MD [facility] Electronically Signed   By: Thom Hall M.D.   On: 04/03/2024 15:01      ASSESSMENT & PLAN RECE ZECHMAN is a 68 y.o. 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  After review of the labs, review of the records, and discussion with the patient the patients findings are most consistent with BCG unresponsive high risk non-muscle invasive bladder cancer, not a candidate for cystectomy.  # BCG-Unresponsive High Risk Non-Muscle Invasive Bladder Cancer --patient is not felt to be a  candidate for cystectomy -- Recommended pembrolizumab  200 mg q. 21 days until progression or intolerance up to 24 months. --Received 8 cycles of Pembrolizumab  on 08/12/2022-01/13/2023: Cycle 8 of Pembrolizumab   --On 03/03/2023, underwent Cystectomy/Prostatectomy showed infiltrative high-grade urothelial carcinoma, size 5.8 cm involving bladder dome, anterior wall and prostatic  stroma. Tumor invades directly into prostatic stroma at apex and mid portion of the gland (pT4a). Metastatic urothelial cancer in one of two left common iliac lymph nodes.  -- Given the results of the pathology showing positive margins and spread to the lymph node we are considering radiation therapy versus chemotherapy.  For chemotherapy, recommend gemcitabine  and cisplatin x 4 cycles.  This would consist of gemcitabine  1000 mg/m on days 1 and 8 and cisplatin 70 mg/m on day 1 of 21-day cycle for 4 cycles. -- Due to kidney dysfunction and anemia, chemotherapy was deferred and patient underwent adjuvant radiation to the surgical bed from 06/19/2023 until 07/27/2023.  He received 50.4 Gray in 28 fractions. PLAN:  --Today is Cycle 7, Day 8 of Enfortumab therapy --Labs from today show WBC 5.3, Hgb 8.3, Plt 130K, creatinine 2.43, LFTs normal --Last CT scan on 01/22/2024 showed response to treatment with no evidence of progression. Repeat CT CAP from 04/15/2024 with results pending.  --Proceed with treatment today without any dose modifications.  --RTC weekly for treatment and every 2 weeks for clinic visit.  #Appetite and weight loss: --Weight is fluctuating with peripheral edema that comes and goes.  --Encouraged to eat small, frequent meals and supplement with protein shakes. He has a follow up with dietician next week --Monitor weight closely.   # Skin Rash--resolved -- Occurred after receiving blood transfusion, etiology not entirely clear. -- Patient has been applying moisturizer to the skin and has been taking Benadryl . --  Skin is persistently dry and itchy and erythematous. -- Completed steroid taper with 60 mg for 5 days, 40 for 5 days, and 20 for 5 days.  #Anemia -Labs today show Hgb is 8.3, overall stable.  --May be multifactorial due to nutritional deficiency as well as kidney dysfunction. -- Patient is currently taking vitamin B12 p.o. daily and ferrous sulfate  every other day. -- Receives vitamin B12 1000 mcg injection monthly, last received on 03/28/2024 -- Last received IV monoferric  1000 mg x 1 dose on 08/25/2023.  -- Most recent labs from 01/25/2024 show no evidence of iron or B12 deficiency.   #Hypokalemia: --Potassium level is 3.7 today --Patient is currently taking potassium supplement 20 meq BID. Advised to continue  #Flank pain--improved: --Secondary to bilateral nephrostomy.  --Patient has tylenol  and tramadol  50 mg PO q 6 hours as needed.   # AKI -- Creatinine continues to increase, 2.43 today. He is scheduled for nephrostomy exchange next on 05/29/2024 --Gave 1 L of NS IV fluids today and will request a follow up with his nephrologist.    #Supportive Care -- chemotherapy education complete -- port placed -- zofran  8mg  q8H PRN and compazine  10mg  PO q6H for nausea -- EMLA  cream for port -- no pain medication required at this time.    No orders of the defined types were placed in this encounter.  All questions were answered. The patient knows to call the clinic with any problems, questions or concerns.  I have spent a total of 30 minutes minutes of face-to-face and non-face-to-face time, preparing to see the patient, performing a medically appropriate examination, counseling and educating the patient, documenting clinical information in the electronic health record,and care coordination.   Johnston Police PA-C Dept of Hematology and Oncology [facility] Phone: [phone]    "

## 2024-04-19 ENCOUNTER — Inpatient Hospital Stay

## 2024-04-19 ENCOUNTER — Telehealth: Payer: Self-pay | Admitting: *Deleted

## 2024-04-19 ENCOUNTER — Inpatient Hospital Stay: Admitting: Physician Assistant

## 2024-04-19 NOTE — Telephone Encounter (Signed)
 Called Dr. Macel office at [facility] @ [phone] per Irene's request. Spoke with Lennart and she moved patient's appointment up to 04/30/2024 @ 3:00.  Faxed office notes and labs to [phone].  Called patient to inform him and spoke with wife.  States she was aware of appointment through My Chart.

## 2024-04-20 ENCOUNTER — Other Ambulatory Visit: Payer: Self-pay | Admitting: Physician Assistant

## 2024-04-23 NOTE — Addendum Note (Signed)
 Encounter addended by: Dekker Verga L on: 04/23/2024 10:23 AM  Actions taken: Imaging Exam ended

## 2024-04-26 ENCOUNTER — Encounter: Payer: Self-pay | Admitting: Hematology and Oncology

## 2024-04-26 ENCOUNTER — Inpatient Hospital Stay: Attending: Hematology and Oncology

## 2024-04-26 ENCOUNTER — Inpatient Hospital Stay: Attending: Hematology and Oncology | Admitting: Nutrition

## 2024-04-26 ENCOUNTER — Inpatient Hospital Stay

## 2024-04-26 ENCOUNTER — Other Ambulatory Visit: Payer: Self-pay

## 2024-04-26 ENCOUNTER — Ambulatory Visit: Payer: Self-pay | Admitting: *Deleted

## 2024-04-26 VITALS — BP 111/56 | HR 57 | Temp 98.1°F | Resp 17 | Ht 66.0 in | Wt 220.5 lb

## 2024-04-26 DIAGNOSIS — C671 Malignant neoplasm of dome of bladder: Secondary | ICD-10-CM | POA: Insufficient documentation

## 2024-04-26 DIAGNOSIS — C673 Malignant neoplasm of anterior wall of bladder: Secondary | ICD-10-CM | POA: Insufficient documentation

## 2024-04-26 DIAGNOSIS — C679 Malignant neoplasm of bladder, unspecified: Secondary | ICD-10-CM

## 2024-04-26 DIAGNOSIS — Z5112 Encounter for antineoplastic immunotherapy: Secondary | ICD-10-CM | POA: Insufficient documentation

## 2024-04-26 DIAGNOSIS — Z79899 Other long term (current) drug therapy: Secondary | ICD-10-CM | POA: Insufficient documentation

## 2024-04-26 DIAGNOSIS — Z95828 Presence of other vascular implants and grafts: Secondary | ICD-10-CM

## 2024-04-26 DIAGNOSIS — C775 Secondary and unspecified malignant neoplasm of intrapelvic lymph nodes: Secondary | ICD-10-CM | POA: Insufficient documentation

## 2024-04-26 LAB — CBC WITH DIFFERENTIAL (CANCER CENTER ONLY)
Abs Immature Granulocytes: 0.03 K/uL (ref 0.00–0.07)
Basophils Absolute: 0 K/uL (ref 0.0–0.1)
Basophils Relative: 0 %
Eosinophils Absolute: 0 K/uL (ref 0.0–0.5)
Eosinophils Relative: 1 %
HCT: 23.8 % — ABNORMAL LOW (ref 39.0–52.0)
Hemoglobin: 7.8 g/dL — ABNORMAL LOW (ref 13.0–17.0)
Immature Granulocytes: 1 %
Lymphocytes Relative: 10 %
Lymphs Abs: 0.6 K/uL — ABNORMAL LOW (ref 0.7–4.0)
MCH: 30.4 pg (ref 26.0–34.0)
MCHC: 32.8 g/dL (ref 30.0–36.0)
MCV: 92.6 fL (ref 80.0–100.0)
Monocytes Absolute: 0.6 K/uL (ref 0.1–1.0)
Monocytes Relative: 10 %
Neutro Abs: 4.5 K/uL (ref 1.7–7.7)
Neutrophils Relative %: 78 %
Platelet Count: 151 K/uL (ref 150–400)
RBC: 2.57 MIL/uL — ABNORMAL LOW (ref 4.22–5.81)
RDW: 18.3 % — ABNORMAL HIGH (ref 11.5–15.5)
WBC Count: 5.7 K/uL (ref 4.0–10.5)
nRBC: 0 % (ref 0.0–0.2)

## 2024-04-26 LAB — CMP (CANCER CENTER ONLY)
ALT: 15 U/L (ref 0–44)
AST: 25 U/L (ref 15–41)
Albumin: 3.5 g/dL (ref 3.5–5.0)
Alkaline Phosphatase: 132 U/L — ABNORMAL HIGH (ref 38–126)
Anion gap: 9 (ref 5–15)
BUN: 28 mg/dL — ABNORMAL HIGH (ref 8–23)
CO2: 23 mmol/L (ref 22–32)
Calcium: 8.6 mg/dL — ABNORMAL LOW (ref 8.9–10.3)
Chloride: 101 mmol/L (ref 98–111)
Creatinine: 2.27 mg/dL — ABNORMAL HIGH (ref 0.61–1.24)
GFR, Estimated: 30 mL/min — ABNORMAL LOW
Glucose, Bld: 121 mg/dL — ABNORMAL HIGH (ref 70–99)
Potassium: 4.4 mmol/L (ref 3.5–5.1)
Sodium: 133 mmol/L — ABNORMAL LOW (ref 135–145)
Total Bilirubin: 0.5 mg/dL (ref 0.0–1.2)
Total Protein: 7.2 g/dL (ref 6.5–8.1)

## 2024-04-26 LAB — PREPARE RBC (CROSSMATCH)

## 2024-04-26 MED ORDER — CYANOCOBALAMIN 1000 MCG/ML IJ SOLN
1000.0000 ug | Freq: Once | INTRAMUSCULAR | Status: AC
  Administered 2024-04-26: 1000 ug via INTRAMUSCULAR
  Filled 2024-04-26: qty 1

## 2024-04-26 MED ORDER — PROCHLORPERAZINE MALEATE 10 MG PO TABS
10.0000 mg | ORAL_TABLET | Freq: Once | ORAL | Status: AC
  Administered 2024-04-26: 10 mg via ORAL
  Filled 2024-04-26: qty 1

## 2024-04-26 MED ORDER — SODIUM CHLORIDE 0.9 % IV SOLN: INTRAVENOUS | Status: DC

## 2024-04-26 MED ORDER — SODIUM CHLORIDE 0.9 % IV SOLN
125.0000 mg | Freq: Once | INTRAVENOUS | Status: AC
  Administered 2024-04-26: 125 mg via INTRAVENOUS
  Filled 2024-04-26: qty 12

## 2024-04-26 NOTE — Telephone Encounter (Signed)
 TCT patient regarding recent scan results. Spoke with both pt and his wife.  Advised that his CT scan looks excellent. There is no evidence of bladder cancer progression. No growth and no sites of disease. We will continue on his current treatment.  They both voiced understanding and he will be here this afternoon for his scheduled labs, treatment and visit with nutritionist.

## 2024-04-26 NOTE — Patient Instructions (Signed)
 [facility] - A DEPT OF [facility]  Discharge Instructions: Thank you for choosing [facility] to provide your oncology and hematology care.   If you have a lab appointment with the Cancer Center, please go directly to the Cancer Center and check in at the registration area.   Wear comfortable clothing and clothing appropriate for easy access to any Portacath or PICC line.   We strive to give you quality time with your provider. You may need to reschedule your appointment if you arrive late (15 or more minutes).  Arriving late affects you and other patients whose appointments are after yours.  Also, if you miss three or more appointments without notifying the office, you may be dismissed from the clinic at the provider's discretion.      For prescription refill requests, have your pharmacy contact our office and allow 72 hours for refills to be completed.    Today you received the following chemotherapy and/or immunotherapy agents: Padcev.      To help prevent nausea and vomiting after your treatment, we encourage you to take your nausea medication as directed.  BELOW ARE SYMPTOMS THAT SHOULD BE REPORTED IMMEDIATELY: *FEVER GREATER THAN 100.4 F (38 C) OR HIGHER *CHILLS OR SWEATING *NAUSEA AND VOMITING THAT IS NOT CONTROLLED WITH YOUR NAUSEA MEDICATION *UNUSUAL SHORTNESS OF BREATH *UNUSUAL BRUISING OR BLEEDING *URINARY PROBLEMS (pain or burning when urinating, or frequent urination) *BOWEL PROBLEMS (unusual diarrhea, constipation, pain near the anus) TENDERNESS IN MOUTH AND THROAT WITH OR WITHOUT PRESENCE OF ULCERS (sore throat, sores in mouth, or a toothache) UNUSUAL RASH, SWELLING OR PAIN  UNUSUAL VAGINAL DISCHARGE OR ITCHING   Items with * indicate a potential emergency and should be followed up as soon as possible or go to the Emergency Department if any problems should occur.  Please show the CHEMOTHERAPY ALERT CARD or IMMUNOTHERAPY  ALERT CARD at check-in to the Emergency Department and triage nurse.  Should you have questions after your visit or need to cancel or reschedule your appointment, please contact [facility] - A DEPT OF [facility]  Dept: [phone]  and follow the prompts.  Office hours are 8:00 a.m. to 4:30 p.m. Monday - Friday. Please note that voicemails left after 4:00 p.m. may not be returned until the following business day.  We are closed weekends and major holidays. You have access to a nurse at all times for urgent questions. Please call the main number to the clinic Dept: [phone] and follow the prompts.   For any non-urgent questions, you may also contact your provider using MyChart. We now offer e-Visits for anyone 35 and older to request care online for non-urgent symptoms. For details visit [email or web address].   Also download the MyChart app! Go to the app store, search "MyChart", open the app, select [place], and log in with your MyChart username and password.

## 2024-04-26 NOTE — Progress Notes (Signed)
 Nutrition follow up completed with patient diagnosed with invasive bladder cancer. Recent CT scan showed no evidence of cancer progression. He is followed by Dr. Federico.  Receives Enfortumab.  Weight: 220 pounds 8 oz Jan 2 233.4 pounds Dec 4 221.75 pounds Nov 6.  Labs include Sodium 133, Glucose 121, BUN 28, Creatinine 2.27  Denies nausea, vomiting, constipation and diarrhea. He does not have a sore mouth or difficulty chewing. Mentions he gets full sooner. His only complaint is taste alterations. He has no taste. He has not tried baking soda and salt water  gargles. He has not consumed any Ensure recently. He grew tired of this during previous treatments. States he can drink them if he has to. He has some at home and also has adequate coupons. Believes weight loss a result of fluid shifts. Continues on Lasix . Agrees to drink one ONS today during infusion.  Follow up with Kidney doctor on Jan 6.  Nutrition Diagnosis: Unintended weight loss stable.  Intervention: Continue small, frequent meals and snacks throughout the day. Add Ensure Plus or equivalent at least once daily. Education provided on strategies for taste alterations. Encouraged him to add spicy dipping sauces or extra spices as desired.  Begin baking soda and salt water  gargles before eating. Fact sheet provided.  Monitoring, Evaluation, Goals: Tolerate adequate calories and protein to minimize loss of lean body mass.  Next Visit: To be scheduled with upcoming visits as needed. Patient has RD contact information for questions or concerns prior to follow up.

## 2024-04-26 NOTE — Telephone Encounter (Signed)
-----   Message from Norleen Kidney, MD sent at 04/26/2024  8:48 AM EST ----- Please let Luis [name] know his CT scan looks excellent. There is no evidence of bladder cancer progression. No growth and no sites of disease. We will continue on his current treatment.

## 2024-04-27 ENCOUNTER — Inpatient Hospital Stay

## 2024-04-27 VITALS — BP 109/59 | HR 50 | Temp 97.9°F | Resp 18

## 2024-04-27 DIAGNOSIS — Z95828 Presence of other vascular implants and grafts: Secondary | ICD-10-CM

## 2024-04-27 DIAGNOSIS — C679 Malignant neoplasm of bladder, unspecified: Secondary | ICD-10-CM

## 2024-04-27 MED ORDER — SODIUM CHLORIDE 0.9% IV SOLUTION
250.0000 mL | INTRAVENOUS | Status: DC
  Administered 2024-04-27: 250 mL via INTRAVENOUS

## 2024-04-27 MED ORDER — SODIUM CHLORIDE 0.9% FLUSH
10.0000 mL | Freq: Once | INTRAVENOUS | Status: AC
  Administered 2024-04-27: 10 mL

## 2024-04-27 MED ORDER — DIPHENHYDRAMINE HCL 25 MG PO CAPS
25.0000 mg | ORAL_CAPSULE | Freq: Once | ORAL | Status: AC
  Administered 2024-04-27: 25 mg via ORAL
  Filled 2024-04-27: qty 1

## 2024-04-27 MED ORDER — ACETAMINOPHEN 325 MG PO TABS
650.0000 mg | ORAL_TABLET | Freq: Once | ORAL | Status: AC
  Administered 2024-04-27: 650 mg via ORAL
  Filled 2024-04-27: qty 2

## 2024-04-29 LAB — BPAM RBC
Blood Product Expiration Date: 202601212359
ISSUE DATE / TIME: 202601030833
Unit Type and Rh: 202601212359
Unit Type and Rh: 600

## 2024-04-29 LAB — TYPE AND SCREEN
ABO/RH(D): A NEG
Antibody Screen: NEGATIVE
Unit division: 0

## 2024-05-02 NOTE — Progress Notes (Signed)
 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 Federico  08/12/2022: Cycle 1 of Pembrolizumab  09/02/2022: Cycle 2 of Pembrolizumab   09/23/2022: Cycle 3 of Pembrolizumab  10/14/2022: Cycle 4 of Pembrolizumab  11/04/2022: Cycle 5 of Pembrolizumab  11/25/2022: Cycle 6 of Pembrolizumab  12/20/2022: Cycle 7 of Pembrolizumab  01/13/2023: Cycle 8 of Pembrolizumab   03/03/2023: Cystectomy/Prostatectomy showed infiltrative high-grade urothelial carcinoma, size 5.8 cm involving bladder dome, anterior wall and prostatic stroma. Tumor invades directly into prostatic stroma at apex and mid portion of the gland (pT4a). Metastatic urothelial cancer in one of two left common iliac lymph nodes.  06/19/2023-07/27/2023: Received adjuvant radiation to surgical bed.  He received 50.4 Gray in 28 fractions. 11/02/2023-Present: Started Cycle 1 Day 1 of Enfortumab.   CHIEF COMPLAINTS/PURPOSE OF CONSULTATION:  High Risk Non-Muscle Invasive Bladder Cancer   HISTORY OF PRESENTING ILLNESS:  Luis [name] 69 y.o. male with medical history significant for BCG unresponsive high risk nonmuscle invasive  bladder cancer who presents for a follow up visit.  He was last seen on 04/17/2024.  He presents today to continue Enfortumab treatment.   On exam today Luis [name] reports energy levels are fair. He does have ongoing fatigue but is able to complete his basic ADLs on his own. He reports his appetite is overall steady but weight is oscillating likely due to fluid retention.He denies nausea, vomiting or bowel habit changes. He denies overt signs of bleeding such as hematochezia, melena or hematuria. He denies fevers, chills, sweats, shortness of breath, chest pain or cough. He has no other complaints. Rest of the ROS is below.   MEDICAL HISTORY:  Past Medical History:  Diagnosis Date   Anxiety    takes Valium  as needed   Aortic stenosis, moderate    Arthritis    Ascending aortic aneurysm    CAD (coronary artery disease)    a. s/p PCI of the RCA 8/12 with DES by Dr Wonda, preserved EF. b. LHC/RHC (2/16) with mean RA 12, PA 32/15, mean PCWP 18, CI 3.47; patent mid and distal RCA stents, 50-60% proximal stenosis small PDA.      Cancer [facility])    bladder   Carotid stenosis    a. Carotid US  (05/2013):  Bilateral 1-39% ICA; L thyroid  nodule (prior hx of aspiration).   Chronic diastolic CHF (congestive heart failure) (HCC)    Complication of anesthesia    difficulty waking up after gallbladder surgery   Depression    Dyspnea    Essential hypertension    GERD (gastroesophageal reflux disease)    if needed will take OTC meds    Heart murmur    History of colonic polyps    hyperplastic   Hyperlipidemia    Joint pain    Lesion of bladder    Myocardial infarction (HCC)  2012   Obesity (BMI 30-39.9) 02/29/2016   Pre-diabetes    Restless leg    Sleep apnea    uses cpap   Tubular adenoma of colon    Vertigo    takes Meclizine  as needed    SURGICAL HISTORY: Past Surgical History:  Procedure Laterality Date   AORTIC VALVE REPLACEMENT N/A 09/16/2021   Procedure: AORTIC VALVE REPLACEMENT  (AVR);  Surgeon: Lucas Dorise POUR, MD;  Location: [facility] OR;  Service: Open Heart Surgery;  Laterality: N/A;   CATARACT EXTRACTION   4 YRS AGO   BOTH EYES   CHOLECYSTECTOMY  07/21/2011   Procedure: LAPAROSCOPIC CHOLECYSTECTOMY WITH INTRAOPERATIVE CHOLANGIOGRAM;  Surgeon: Alm VEAR Angle, MD;  Location: [facility];  Service: General;  Laterality: N/A;   CORONARY ANGIOPLASTY  2012   2 stents   coronary stenting     s/p PCI of the RCA by Dr Wonda 8/12 with 2 promus stents   CYSTOSCOPY W/ RETROGRADES Bilateral 12/13/2019   Procedure: CYSTOSCOPY WITH RETROGRADE PYELOGRAM;  Surgeon: Alvaro Hummer, MD;  Location: [facility];  Service: Urology;  Laterality: Bilateral;   CYSTOSCOPY W/ RETROGRADES Bilateral 10/14/2020   Procedure: CYSTOSCOPY WITH RETROGRADE PYELOGRAM;  Surgeon: Alvaro Hummer, MD;  Location: [facility];  Service: Urology;  Laterality: Bilateral;   CYSTOSCOPY W/ RETROGRADES Bilateral 12/16/2020   Procedure: CYSTOSCOPY WITH RETROGRADE PYELOGRAM;  Surgeon: Alvaro Hummer, MD;  Location: [facility];  Service: Urology;  Laterality: Bilateral;   CYSTOSCOPY W/ RETROGRADES Bilateral 06/03/2022   Procedure: CYSTOSCOPY WITH RETROGRADE PYELOGRAM;  Surgeon: Alvaro Hummer, MD;  Location: [facility];  Service: Urology;  Laterality: Bilateral;   CYSTOSCOPY W/ RETROGRADES Bilateral 12/28/2022   Procedure: CYSTOSCOPY WITH RETROGRADE PYELOGRAM, FULGARATION OF BLEEDERS;  Surgeon: Alvaro Hummer KATHEE Mickey., MD;  Location: [facility];  Service: Urology;  Laterality: Bilateral;   CYSTOSCOPY WITH INJECTION N/A 03/03/2023   Procedure: CYSTOSCOPY WITH INDOCYANINE INJECTION;  Surgeon: Alvaro Hummer KATHEE Mickey., MD;  Location: [facility];  Service: Urology;  Laterality: N/A;  360 MINUTES   ESOPHAGOGASTRODUODENOSCOPY N/A 06/28/2023   Procedure: EGD (ESOPHAGOGASTRODUODENOSCOPY);  Surgeon: Charlanne Groom, MD;  Location: [facility];  Service: Gastroenterology;  Laterality: N/A;   IR IMAGING GUIDED PORT  INSERTION  05/29/2023   IR NEPHROSTOMY EXCHANGE LEFT  08/07/2023   IR NEPHROSTOMY EXCHANGE LEFT  10/02/2023   IR NEPHROSTOMY EXCHANGE LEFT  11/27/2023   IR NEPHROSTOMY EXCHANGE LEFT  01/10/2024   IR NEPHROSTOMY EXCHANGE LEFT  02/21/2024   IR NEPHROSTOMY EXCHANGE LEFT  04/03/2024   IR NEPHROSTOMY PLACEMENT RIGHT  07/01/2023   IR RADIOLOGY PERIPHERAL GUIDED IV START  07/01/2023   IR US  GUIDE VASC ACCESS LEFT  07/01/2023   LEFT AND RIGHT HEART CATHETERIZATION WITH CORONARY ANGIOGRAM N/A 06/23/2014   Procedure: LEFT AND RIGHT HEART CATHETERIZATION WITH CORONARY ANGIOGRAM;  Surgeon: Ezra GORMAN Shuck, MD;  Location: [facility] CATH LAB;  Service: Cardiovascular;  Laterality: N/A;   NECK SURGERY  03/23/09   per Dr. Barbarann, cervical fusion    PERICARDIOCENTESIS N/A 09/28/2021   Procedure: PERICARDIOCENTESIS;  Surgeon: Dann Candyce GORMAN, MD;  Location: [facility] INVASIVE CV LAB;  Service: Cardiovascular;  Laterality: N/A;   REPLACEMENT ASCENDING AORTA N/A 09/16/2021   Procedure: REPLACEMENT ASCENDING AORTA WITH 30 X HEMASHIELD PLATINUM WOVEN DOUBLE VELOUR VASCULAR GRAFT;  Surgeon: Lucas Dorise POUR, MD;  Location: [facility] OR;  Service: Open Heart Surgery;  Laterality: N/A;  CIRC ARREST   right elbow surgery     RIGHT HEART CATH N/A  06/15/2023   Procedure: RIGHT HEART CATH;  Surgeon: Rolan Ezra RAMAN, MD;  Location: [facility] INVASIVE CV LAB;  Service: Cardiovascular;  Laterality: N/A;   RIGHT HEART CATH AND CORONARY ANGIOGRAPHY N/A 07/08/2021   Procedure: RIGHT HEART CATH AND CORONARY ANGIOGRAPHY;  Surgeon: Rolan Ezra RAMAN, MD;  Location: [facility] INVASIVE CV LAB;  Service: Cardiovascular;  Laterality: N/A;   ROBOT ASSISTED LAPAROSCOPIC COMPLETE CYSTECT ILEAL CONDUIT N/A 03/03/2023   Procedure: XI ROBOTIC ASSISTED LAPAROSCOPIC COMPLETE CYSTECTECTOMY WITH  ILEAL CONDUIT DIVERSION;  Surgeon: Alvaro Ricardo KATHEE Mickey., MD;  Location: [facility];  Service: Urology;  Laterality: N/A;   ROBOT ASSISTED LAPAROSCOPIC RADICAL PROSTATECTOMY N/A 03/03/2023    Procedure: XI ROBOTIC ASSISTED LAPAROSCOPIC RADICAL PROSTATECTOMY WITH LYMPH NODE DISSECTION;  Surgeon: Alvaro Ricardo KATHEE Mickey., MD;  Location: [facility];  Service: Urology;  Laterality: N/A;   solonscopy  05/23/08   per Dr. Jakie inch hemorrhoids only, repeat in 5 years   SUBXYPHOID PERICARDIAL WINDOW N/A 09/28/2021   Procedure: SUBXYPHOID PERICARDIAL WINDOW;  Surgeon: Lucas Dorise POUR, MD;  Location: [facility] OR;  Service: Thoracic;  Laterality: N/A;   TEE WITHOUT CARDIOVERSION N/A 06/23/2014   Procedure: TRANSESOPHAGEAL ECHOCARDIOGRAM (TEE);  Surgeon: Ezra RAMAN Rolan, MD;  Location: [facility] ENDOSCOPY;  Service: Cardiovascular;  Laterality: N/A;   TEE WITHOUT CARDIOVERSION N/A 01/21/2016   Procedure: TRANSESOPHAGEAL ECHOCARDIOGRAM (TEE);  Surgeon: Ezra RAMAN Rolan, MD;  Location: [facility] ENDOSCOPY;  Service: Cardiovascular;  Laterality: N/A;   TEE WITHOUT CARDIOVERSION N/A 09/16/2021   Procedure: TRANSESOPHAGEAL ECHOCARDIOGRAM (TEE);  Surgeon: Lucas Dorise POUR, MD;  Location: [facility] OR;  Service: Open Heart Surgery;  Laterality: N/A;   TEE WITHOUT CARDIOVERSION N/A 09/28/2021   Procedure: TRANSESOPHAGEAL ECHOCARDIOGRAM (TEE);  Surgeon: Lucas Dorise POUR, MD;  Location: [facility] OR;  Service: Thoracic;  Laterality: N/A;   TONSILLECTOMY     TRANSESOPHAGEAL ECHOCARDIOGRAM (CATH LAB) N/A 04/17/2023   Procedure: TRANSESOPHAGEAL ECHOCARDIOGRAM;  Surgeon: Jeffrie Oneil BROCKS, MD;  Location: [facility] INVASIVE CV LAB;  Service: Cardiovascular;  Laterality: N/A;   TRANSURETHRAL RESECTION OF BLADDER TUMOR N/A 10/21/2019   Procedure: TRANSURETHRAL RESECTION OF BLADDER TUMOR (TURBT);  Surgeon: Ottelin, Mark, MD;  Location: [facility];  Service: Urology;  Laterality: N/A;   TRANSURETHRAL RESECTION OF BLADDER TUMOR N/A 12/13/2019   Procedure: TRANSURETHRAL RESECTION OF BLADDER TUMOR (TURBT);  Surgeon: Alvaro Ricardo, MD;  Location: [place];  Service: Urology;  Laterality: N/A;  1 HR   TRANSURETHRAL RESECTION OF BLADDER  TUMOR N/A 10/14/2020   Procedure: TRANSURETHRAL RESECTION OF BLADDER TUMOR (TURBT);  Surgeon: Alvaro Ricardo, MD;  Location: [facility];  Service: Urology;  Laterality: N/A;   TRANSURETHRAL RESECTION OF BLADDER TUMOR N/A 12/16/2020   Procedure: RESTAGING TRANSURETHRAL RESECTION OF BLADDER TUMOR (TURBT);  Surgeon: Alvaro Ricardo, MD;  Location: [facility];  Service: Urology;  Laterality: N/A;   TRANSURETHRAL RESECTION OF BLADDER TUMOR N/A 06/03/2022   Procedure: TRANSURETHRAL RESECTION OF BLADDER TUMOR (TURBT);  Surgeon: Alvaro Ricardo, MD;  Location: [facility];  Service: Urology;  Laterality: N/A;   UMBILICAL HERNIA REPAIR  03/03/2023   Procedure: HERNIA REPAIR UMBILICAL;  Surgeon: Alvaro Ricardo KATHEE Mickey., MD;  Location: [facility];  Service: Urology;;    SOCIAL HISTORY: Social History   Socioeconomic History   Marital status: Married    Spouse name: Not on file   Number of children: 4   Years of education: Not on file   Highest education level: Not on file  Occupational History   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  Tobacco Use   Smoking status: Former    Current packs/day: 0.00    Average packs/day: 2.0 packs/day for 40.0 years (80.0 ttl pk-yrs)    Types: Cigarettes    Start date: 12    Quit date: 2012    Years since quitting: 14.0    Passive exposure: Never   Smokeless tobacco: Never  Vaping Use   Vaping status: Never Used  Substance and Sexual Activity   Alcohol use: No    Alcohol/week: 0.0 standard drinks of alcohol   Drug use: No   Sexual activity: Not on file  Other Topics Concern   Not on file  Social History Narrative   Not on file   Social Drivers of Health   Tobacco Use: Medium Risk (02/05/2024)   Patient History    Smoking Tobacco Use: Former    Smokeless Tobacco Use: Never    Passive Exposure: Never  Physicist, Medical Strain: Low Risk (02/15/2024)   Overall Financial Resource Strain (CARDIA)     Difficulty of Paying Living Expenses: Not hard at all  Food Insecurity: No Food Insecurity (02/15/2024)   Epic    Worried About Programme Researcher, Broadcasting/film/video in the Last Year: Never true    Ran Out of Food in the Last Year: Never true  Transportation Needs: No Transportation Needs (02/15/2024)   Epic    Lack of Transportation (Medical): No    Lack of Transportation (Non-Medical): No  Physical Activity: Inactive (02/15/2024)   Exercise Vital Sign    Days of Exercise per Week: 0 days    Minutes of Exercise per Session: Not on file  Stress: Stress Concern Present (02/15/2024)   [employer] of Occupational Health - Occupational Stress Questionnaire    Feeling of Stress: To some extent  Social Connections: Unknown (02/15/2024)   Social Connection and Isolation Panel    Frequency of Communication with Friends and Family: Once a week    Frequency of Social Gatherings with Friends and Family: More than three times a week    Attends Religious Services: Patient declined    Database Administrator or Organizations: Patient declined    Attends Banker Meetings: Not on file    Marital Status: Married  Intimate Partner Violence: Not At Risk (07/04/2023)   Humiliation, Afraid, Rape, and Kick questionnaire    Fear of Current or Ex-Partner: No    Emotionally Abused: No    Physically Abused: No    Sexually Abused: No  Depression (PHQ2-9): Low Risk (05/03/2024)   Depression (PHQ2-9)    PHQ-2 Score: 0  Alcohol Screen: Low Risk (05/09/2023)   Alcohol Screen    Last Alcohol Screening Score (AUDIT): 0  Housing: Low Risk (02/15/2024)   Epic    Unable to Pay for Housing in the Last Year: No    Number of Times Moved in the Last Year: 0    Homeless in the Last Year: No  Utilities: Not At Risk (07/04/2023)   AHC Utilities    Threatened with loss of utilities: No  Health Literacy: Adequate Health Literacy (11/03/2022)   B1300 Health Literacy    Frequency of need for help with medical instructions:  Rarely    FAMILY HISTORY: Family History  Problem Relation Age of Onset   Lung cancer Mother        lung   Esophageal cancer Cousin    Colon cancer Neg Hx    Rectal cancer  Neg Hx    Stomach cancer Neg Hx     ALLERGIES:  has no known allergies.  MEDICATIONS:  Current Outpatient Medications  Medication Sig Dispense Refill   acetaminophen  (TYLENOL ) 325 MG tablet Take 650 mg by mouth every 6 (six) hours as needed (for pain).     amLODipine  (NORVASC ) 5 MG tablet TAKE 1 TABLET (5 MG TOTAL) BY MOUTH DAILY. 90 tablet 1   aspirin  EC 81 MG tablet Take 81 mg by mouth in the morning.     carvedilol  (COREG ) 3.125 MG tablet TAKE 1 TABLET BY MOUTH TWICE A DAY WITH FOOD 180 tablet 1   cyanocobalamin  (VITAMIN B12) 1000 MCG tablet Take 1 tablet (1,000 mcg total) by mouth daily. 30 tablet 6   diazepam  (VALIUM ) 5 MG tablet TAKE 1 TABLET BY MOUTH EVERY 12 HOURS AS NEEDED FOR ANXIETY. 60 tablet 5   famotidine  (PEPCID ) 20 MG tablet Take 1 tablet (20 mg total) by mouth 2 (two) times daily for 6 days. (Patient taking differently: Take 20 mg by mouth 2 (two) times daily. As needed) 12 tablet 0   ferrous sulfate  325 (65 FE) MG tablet Take 1 tablet (325 mg total) by mouth daily with breakfast. Please take with a source of Vitamin C 90 tablet 3   furosemide  (LASIX ) 20 MG tablet TAKE 3 TABLETS BY MOUTH EVERY MORNING AND TAKE 2 TABLETS BY MOUTH EVERY EVENING 150 tablet 3   ipratropium (ATROVENT ) 0.03 % nasal spray Place 2 sprays into both nostrils every 12 (twelve) hours as needed for rhinitis.     lidocaine -prilocaine  (EMLA ) cream Apply topically once a week. 30 g 0   Magnesium  500 MG TABS Take 500 mg by mouth every morning.     meclizine  (ANTIVERT ) 25 MG tablet Take 1 tablet (25 mg total) by mouth 3 (three) times daily as needed for dizziness. 30 tablet 0   mirtazapine  (REMERON ) 7.5 MG tablet TAKE 1 TABLET BY MOUTH AT BEDTIME. 90 tablet 1   multivitamin-iron-minerals-folic acid  (CENTRUM) chewable tablet Chew 1  tablet by mouth daily.     nitroGLYCERIN  (NITROSTAT ) 0.4 MG SL tablet Place 0.4 mg under the tongue every 5 (five) minutes as needed for chest pain.     ondansetron  (ZOFRAN ) 8 MG tablet Take 1 tablet (8 mg total) by mouth every 8 (eight) hours as needed. 30 tablet 0   pantoprazole  (PROTONIX ) 40 MG tablet TAKE 1 TABLET (40 MG TOTAL) BY MOUTH TWICE A DAY BEFORE MEALS 180 tablet 1   potassium chloride  SA (KLOR-CON  M20) 20 MEQ tablet Take 1 tablet (20 mEq total) by mouth daily. (Patient taking differently: Take 20 mEq by mouth 2 (two) times daily.)     prochlorperazine  (COMPAZINE ) 10 MG tablet Take 1 tablet (10 mg total) by mouth every 6 (six) hours as needed for nausea or vomiting. 30 tablet 0   rosuvastatin  (CRESTOR ) 20 MG tablet Take 1 tablet (20 mg total) by mouth daily. 90 tablet 3   temazepam  (RESTORIL ) 30 MG capsule Take 1 capsule (30 mg total) by mouth at bedtime as needed for sleep. 90 capsule 1   traMADol  (ULTRAM ) 50 MG tablet Take 1 tablet (50 mg total) by mouth every 6 (six) hours as needed. 30 tablet 0   venlafaxine  XR (EFFEXOR -XR) 150 MG 24 hr capsule Take 1 capsule (150 mg total) by mouth daily with breakfast. 90 capsule 3   No current facility-administered medications for this visit.   Facility-Administered Medications Ordered in Other Visits  Medication Dose  Route Frequency Provider Last Rate Last Admin   0.9 %  sodium chloride  infusion   Intravenous Continuous Federico Norleen ONEIDA MADISON, MD 10 mL/hr at 05/03/24 1419 New Bag at 05/03/24 1419   sodium chloride  flush (NS) 0.9 % injection 10 mL  10 mL Intracatheter PRN Dorsey, John T IV, MD   10 mL at 05/03/24 1551    REVIEW OF SYSTEMS:   Constitutional: ( - ) fevers, ( - )  chills , ( - ) night sweats Eyes: ( - ) blurriness of vision, ( - ) double vision, ( - ) watery eyes Ears, nose, mouth, throat, and face: ( - ) mucositis, ( - ) sore throat Respiratory: ( - ) cough, ( - ) dyspnea, ( - ) wheezes Cardiovascular: ( - ) palpitation, ( - )  chest discomfort, ( - ) lower extremity swelling Gastrointestinal:  ( - ) nausea, ( - ) heartburn, ( - ) change in bowel habits Skin: ( - ) abnormal skin rashes Lymphatics: ( - ) new lymphadenopathy, ( - ) easy bruising Neurological: ( - ) numbness, ( - ) tingling, ( - ) new weaknesses Behavioral/Psych: ( - ) mood change, ( - ) new changes  All other systems were reviewed with the patient and are negative.  PHYSICAL EXAMINATION: ECOG PERFORMANCE STATUS: 1 - Symptomatic but completely ambulatory  Vitals:   05/03/24 1350  BP: 136/61  Pulse: 62  Resp: 17  Temp: 97.6 F (36.4 C)  SpO2: 99%    Filed Weights   05/03/24 1350  Weight: 217 lb 11.2 oz (98.7 kg)    GENERAL: well appearing elderly Caucasian male in NAD  SKIN: skin color, texture, turgor are normal, no rashes or significant lesions EYES: conjunctiva are pink and non-injected, sclera clear LUNGS: clear to auscultation and percussion with normal breathing effort HEART: regular rate & rhythm and no murmurs and no lower extremity edema Musculoskeletal: no cyanosis of digits and no clubbing.  PSYCH: alert & oriented x 3, fluent speech NEURO: no focal motor/sensory deficits  LABORATORY DATA:  I have reviewed the data as listed    Latest Ref Rng & Units 05/03/2024   12:31 PM 04/26/2024    1:48 PM 04/17/2024    9:47 AM  CBC  WBC 4.0 - 10.5 K/uL 4.8  5.7  5.3   Hemoglobin 13.0 - 17.0 g/dL 8.6  7.8  8.3   Hematocrit 39.0 - 52.0 % 26.2  23.8  25.4   Platelets 150 - 400 K/uL 116  151  130        Latest Ref Rng & Units 05/03/2024   12:31 PM 04/26/2024    1:48 PM 04/17/2024    9:47 AM  CMP  Glucose 70 - 99 mg/dL 851  878  852   BUN 8 - 23 mg/dL 20  28  27    Creatinine 0.61 - 1.24 mg/dL 7.89  7.72  7.56   Sodium 135 - 145 mmol/L 137  133  136   Potassium 3.5 - 5.1 mmol/L 4.0  4.4  3.7   Chloride 98 - 111 mmol/L 104  101  102   CO2 22 - 32 mmol/L 23  23  24    Calcium  8.9 - 10.3 mg/dL 8.7  8.6  8.4   Total Protein 6.5 - 8.1  g/dL 7.4  7.2  7.2   Total Bilirubin 0.0 - 1.2 mg/dL 0.6  0.5  0.7   Alkaline Phos 38 - 126 U/L 131  132  130   AST 15 - 41 U/L 29  25  24    ALT 0 - 44 U/L 16  15  14      RADIOGRAPHIC STUDIES: CT OUTSIDE FILMS BODY/ABD/PELVIS Result Date: 04/23/2024 This examination belongs to an outside facility and is stored here for comparison purposes only.  Contact the originating outside institution for any associated report or interpretation.  CT CHEST ABDOMEN PELVIS W CONTRAST Result Date: 04/17/2024 EXAM: CT CHEST, ABDOMEN AND PELVIS WITH CONTRAST 04/15/2024 12:59:05 PM TECHNIQUE: CT of the chest, abdomen and pelvis was performed with the administration of 80 mL of iohexol  (OMNIPAQUE ) 300 MG/ML solution intravenous contrast. Multiplanar reformatted images are provided for review. Automated exposure control, iterative reconstruction, and/or weight based adjustment of the mA/kV was utilized to reduce the radiation dose to as low as reasonably achievable. COMPARISON: CT chest abdomen and pelvis 01/22/2024. CLINICAL HISTORY: Bladder cancer, invasive, assess treatment response. * Tracking Code: BO * FINDINGS: CHEST: MEDIASTINUM AND LYMPH NODES: Port in the right chest wall. CABG and valve replacement anatomy. Midline sternotomy. The central airways are clear. No mediastinal, hilar or axillary lymphadenopathy. LUNGS AND PLEURA: No suspicious pulmonary nodules. No focal consolidation or pulmonary edema. No pleural effusion or pneumothorax. ABDOMEN AND PELVIS: LIVER: The liver is unremarkable. GALLBLADDER AND BILE DUCTS: Post cholecystectomy. No biliary ductal dilatation. SPLEEN: No acute abnormality. PANCREAS: No acute abnormality. ADRENAL GLANDS: No acute abnormality. KIDNEYS, URETERS AND BLADDER: Bilateral percutaneous nephrostomy tubes present. Mild pelviectasis similar to comparison exam. Ureters are normal. Ureteral joint and iliac conduit in the right lower quadrant. Post cystectomy. No stones in the kidneys or  ureters. No perinephric or periureteral stranding. GI AND BOWEL: Stomach demonstrates no acute abnormality. There is no bowel obstruction. REPRODUCTIVE ORGANS: No acute abnormality. PERITONEUM AND RETROPERITONEUM: No ascites. No free air. VASCULATURE: Aorta is normal in caliber. ABDOMINAL AND PELVIS LYMPH NODES: No retroperitoneal lymphadenopathy. Small lymph node left aorta previously measured measures 6 mm on image 73 series 2 compared to 6 mm. BONES AND SOFT TISSUES: No acute osseous abnormality. No focal soft tissue abnormality. IMPRESSION: 1. No evidence of metastatic disease in the chest, abdomen, or pelvis. 2. Stable small left para-aortic lymph node measuring 6 mm. 3. Percutaneous nephrostomy tubes without complication Electronically signed by: Norleen Boxer MD 04/17/2024 02:54 PM EST RP Workstation: [id number]      ASSESSMENT & PLAN Luis [name] is a 69 y.o. 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  After review of the labs, review of the records, and discussion with the patient the patients findings are most consistent with BCG unresponsive high risk non-muscle invasive bladder cancer, not a candidate for cystectomy.  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 21 days until progression or intolerance up to 24 months. --Received 8 cycles of Pembrolizumab  on 08/12/2022-01/13/2023: Cycle 8 of Pembrolizumab   --On 03/03/2023, underwent Cystectomy/Prostatectomy showed infiltrative high-grade urothelial carcinoma, size 5.8 cm involving bladder dome, anterior wall and prostatic stroma. Tumor invades directly into prostatic stroma at apex and mid portion of the gland (pT4a). Metastatic urothelial cancer in one of two left common iliac lymph nodes.  -- Given the results of the pathology showing  positive margins and spread to the lymph node we are considering radiation therapy versus chemotherapy.  For chemotherapy, recommend gemcitabine  and cisplatin x 4 cycles.  This would consist  of gemcitabine  1000 mg/m on days 1 and 8 and cisplatin 70 mg/m on day 1 of 21-day cycle for 4 cycles. -- Due to kidney dysfunction and anemia, chemotherapy was deferred and patient underwent adjuvant radiation to the surgical bed from 06/19/2023 until 07/27/2023.  He received 50.4 Gray in 28 fractions. PLAN:  --Today is Cycle 7, Day 15 of Enfortumab therapy --Labs from today show WBC 4.8, Hgb 8.6, Plt 116K, creatinine improved to 2.10, LFTs normal . --Last CT scan on 04/15/2024 showed response to treatment with no evidence of progression.  --Proceed with treatment today without any dose modifications.  --RTC weekly for treatment and every 2 weeks for clinic visit.  #Appetite and weight loss: --Weight is fluctuating with peripheral edema that comes and goes.  --Encouraged to eat small, frequent meals and supplement with protein shakes. He has a follow up with dietician next week --Monitor weight closely.   # Skin Rash--resolved -- Occurred after receiving blood transfusion, etiology not entirely clear. -- Patient has been applying moisturizer to the skin and has been taking Benadryl . -- Skin is persistently dry and itchy and erythematous. -- Completed steroid taper with 60 mg for 5 days, 40 for 5 days, and 20 for 5 days.  #Anemia -Labs today show Hgb is 8.6 overall stable.  --May be multifactorial due to nutritional deficiency as well as kidney dysfunction. -- Patient is currently taking vitamin B12 p.o. daily and ferrous sulfate  every other day. -- Receives vitamin B12 1000 mcg injection monthly, last received on 03/28/2024 -- Last received IV monoferric  1000 mg x 1 dose on 08/25/2023.  -- Most recent labs from 01/25/2024 show no evidence of iron or B12 deficiency.   #Hypokalemia: --Potassium level is  4.0 today --Patient is currently taking potassium supplement 20 meq BID. Advised to continue  #Flank pain--improved: --Secondary to bilateral nephrostomy.  --Patient has tylenol  and tramadol  50 mg PO q 6 hours as needed.   # AKI -- Creatinine is 2.10, improved today. He is scheduled for nephrostomy exchange next on 05/29/2024  #Supportive Care -- chemotherapy education complete -- port placed -- zofran  8mg  q8H PRN and compazine  10mg  PO q6H for nausea -- EMLA  cream for port -- no pain medication required at this time.    Orders Placed This Encounter  Procedures   CBC with Differential (Cancer Center Only)    Standing Status:   Future    Expected Date:   05/16/2024    Expiration Date:   05/16/2025   CMP (Cancer Center only)    Standing Status:   Future    Expected Date:   05/16/2024    Expiration Date:   05/16/2025   CBC with Differential (Cancer Center Only)    Standing Status:   Future    Expected Date:   05/23/2024    Expiration Date:   05/23/2025   CMP (Cancer Center only)    Standing Status:   Future    Expected Date:   05/23/2024    Expiration Date:   05/23/2025   CBC with Differential (Cancer Center Only)    Standing Status:   Future    Expected Date:   05/30/2024    Expiration Date:   05/30/2025   CMP (Cancer Center only)    Standing Status:   Future    Expected Date:   05/30/2024    Expiration Date:   05/30/2025   CBC with Differential (Cancer Center Only)    Standing Status:   Future    Expected  Date:   06/13/2024    Expiration Date:   06/13/2025   CMP (Cancer Center only)    Standing Status:   Future    Expected Date:   06/13/2024    Expiration Date:   06/13/2025   CBC with Differential (Cancer Center Only)    Standing Status:   Future    Expected Date:   06/20/2024    Expiration Date:   06/20/2025   CMP (Cancer Center only)    Standing Status:   Future    Expected Date:   06/20/2024    Expiration Date:   06/20/2025   CBC with Differential (Cancer Center Only)    Standing  Status:   Future    Expected Date:   06/27/2024    Expiration Date:   06/27/2025   CMP (Cancer Center only)    Standing Status:   Future    Expected Date:   06/27/2024    Expiration Date:   06/27/2025   CBC with Differential (Cancer Center Only)    Standing Status:   Future    Expected Date:   07/11/2024    Expiration Date:   07/11/2025   CMP (Cancer Center only)    Standing Status:   Future    Expected Date:   07/11/2024    Expiration Date:   07/11/2025   CBC with Differential (Cancer Center Only)    Standing Status:   Future    Expected Date:   07/18/2024    Expiration Date:   07/18/2025   CMP (Cancer Center only)    Standing Status:   Future    Expected Date:   07/18/2024    Expiration Date:   07/18/2025   CBC with Differential (Cancer Center Only)    Standing Status:   Future    Expected Date:   07/25/2024    Expiration Date:   07/25/2025   CMP (Cancer Center only)    Standing Status:   Future    Expected Date:   07/25/2024    Expiration Date:   07/25/2025   CBC with Differential (Cancer Center Only)    Standing Status:   Future    Expected Date:   08/08/2024    Expiration Date:   08/08/2025   CMP (Cancer Center only)    Standing Status:   Future    Expected Date:   08/08/2024    Expiration Date:   08/08/2025   CBC with Differential (Cancer Center Only)    Standing Status:   Future    Expected Date:   08/15/2024    Expiration Date:   08/15/2025   CMP (Cancer Center only)    Standing Status:   Future    Expected Date:   08/15/2024    Expiration Date:   08/15/2025   CBC with Differential (Cancer Center Only)    Standing Status:   Future    Expected Date:   08/22/2024    Expiration Date:   08/22/2025   CMP (Cancer Center only)    Standing Status:   Future    Expected Date:   08/22/2024    Expiration Date:   08/22/2025   All questions were answered. The patient knows to call the clinic with any problems, questions or concerns.  I have spent a total of 30 minutes minutes of face-to-face and  non-face-to-face time, preparing to see the patient, performing a medically appropriate examination, counseling and educating the patient, documenting clinical information in the electronic health record,and care coordination.  Johnston Police PA-C Dept of Hematology and Oncology [facility] Phone: [phone]    "

## 2024-05-03 ENCOUNTER — Inpatient Hospital Stay: Admitting: Physician Assistant

## 2024-05-03 ENCOUNTER — Inpatient Hospital Stay

## 2024-05-03 VITALS — BP 136/61 | HR 62 | Temp 97.6°F | Resp 17 | Wt 217.7 lb

## 2024-05-03 VITALS — BP 117/66 | HR 54 | Temp 97.9°F | Resp 16

## 2024-05-03 DIAGNOSIS — C679 Malignant neoplasm of bladder, unspecified: Secondary | ICD-10-CM

## 2024-05-03 LAB — CMP (CANCER CENTER ONLY)
ALT: 16 U/L (ref 0–44)
AST: 29 U/L (ref 15–41)
Albumin: 3.5 g/dL (ref 3.5–5.0)
Alkaline Phosphatase: 131 U/L — ABNORMAL HIGH (ref 38–126)
Anion gap: 10 (ref 5–15)
BUN: 20 mg/dL (ref 8–23)
CO2: 23 mmol/L (ref 22–32)
Calcium: 8.7 mg/dL — ABNORMAL LOW (ref 8.9–10.3)
Chloride: 104 mmol/L (ref 98–111)
Creatinine: 2.1 mg/dL — ABNORMAL HIGH (ref 0.61–1.24)
GFR, Estimated: 33 mL/min — ABNORMAL LOW
Glucose, Bld: 148 mg/dL — ABNORMAL HIGH (ref 70–99)
Potassium: 4 mmol/L (ref 3.5–5.1)
Sodium: 137 mmol/L (ref 135–145)
Total Bilirubin: 0.6 mg/dL (ref 0.0–1.2)
Total Protein: 7.4 g/dL (ref 6.5–8.1)

## 2024-05-03 LAB — CBC WITH DIFFERENTIAL (CANCER CENTER ONLY)
Abs Immature Granulocytes: 0.02 K/uL (ref 0.00–0.07)
Basophils Absolute: 0 K/uL (ref 0.0–0.1)
Basophils Relative: 0 %
Eosinophils Absolute: 0.1 K/uL (ref 0.0–0.5)
Eosinophils Relative: 3 %
HCT: 26.2 % — ABNORMAL LOW (ref 39.0–52.0)
Hemoglobin: 8.6 g/dL — ABNORMAL LOW (ref 13.0–17.0)
Immature Granulocytes: 0 %
Lymphocytes Relative: 10 %
Lymphs Abs: 0.5 K/uL — ABNORMAL LOW (ref 0.7–4.0)
MCH: 30.2 pg (ref 26.0–34.0)
MCHC: 32.8 g/dL (ref 30.0–36.0)
MCV: 91.9 fL (ref 80.0–100.0)
Monocytes Absolute: 0.4 K/uL (ref 0.1–1.0)
Monocytes Relative: 9 %
Neutro Abs: 3.7 K/uL (ref 1.7–7.7)
Neutrophils Relative %: 78 %
Platelet Count: 116 K/uL — ABNORMAL LOW (ref 150–400)
RBC: 2.85 MIL/uL — ABNORMAL LOW (ref 4.22–5.81)
RDW: 18 % — ABNORMAL HIGH (ref 11.5–15.5)
WBC Count: 4.8 K/uL (ref 4.0–10.5)
nRBC: 0 % (ref 0.0–0.2)

## 2024-05-03 MED ORDER — PROCHLORPERAZINE MALEATE 10 MG PO TABS
10.0000 mg | ORAL_TABLET | Freq: Once | ORAL | Status: AC
  Administered 2024-05-03: 10 mg via ORAL
  Filled 2024-05-03: qty 1

## 2024-05-03 MED ORDER — SODIUM CHLORIDE 0.9 % IV SOLN
125.0000 mg | Freq: Once | INTRAVENOUS | Status: AC
  Administered 2024-05-03: 125 mg via INTRAVENOUS
  Filled 2024-05-03: qty 9

## 2024-05-03 MED ORDER — SODIUM CHLORIDE 0.9 % IV SOLN: INTRAVENOUS | Status: DC

## 2024-05-03 MED ORDER — SODIUM CHLORIDE 0.9% FLUSH
10.0000 mL | INTRAVENOUS | Status: DC | PRN
  Administered 2024-05-03: 10 mL

## 2024-05-03 NOTE — Patient Instructions (Signed)
 [facility] - A DEPT OF [facility]  Discharge Instructions: Thank you for choosing [facility] to provide your oncology and hematology care.   If you have a lab appointment with the Cancer Center, please go directly to the Cancer Center and check in at the registration area.   Wear comfortable clothing and clothing appropriate for easy access to any Portacath or PICC line.   We strive to give you quality time with your provider. You may need to reschedule your appointment if you arrive late (15 or more minutes).  Arriving late affects you and other patients whose appointments are after yours.  Also, if you miss three or more appointments without notifying the office, you may be dismissed from the clinic at the providers discretion.      For prescription refill requests, have your pharmacy contact our office and allow 72 hours for refills to be completed.    Today you received the following chemotherapy and/or immunotherapy agent: Enfortumab- Vedotin (Padcev )   To help prevent nausea and vomiting after your treatment, we encourage you to take your nausea medication as directed.  BELOW ARE SYMPTOMS THAT SHOULD BE REPORTED IMMEDIATELY: *FEVER GREATER THAN 100.4 F (38 C) OR HIGHER *CHILLS OR SWEATING *NAUSEA AND VOMITING THAT IS NOT CONTROLLED WITH YOUR NAUSEA MEDICATION *UNUSUAL SHORTNESS OF BREATH *UNUSUAL BRUISING OR BLEEDING *URINARY PROBLEMS (pain or burning when urinating, or frequent urination) *BOWEL PROBLEMS (unusual diarrhea, constipation, pain near the anus) TENDERNESS IN MOUTH AND THROAT WITH OR WITHOUT PRESENCE OF ULCERS (sore throat, sores in mouth, or a toothache) UNUSUAL RASH, SWELLING OR PAIN  UNUSUAL VAGINAL DISCHARGE OR ITCHING   Items with * indicate a potential emergency and should be followed up as soon as possible or go to the Emergency Department if any problems should occur.  Please show the CHEMOTHERAPY ALERT CARD or  IMMUNOTHERAPY ALERT CARD at check-in to the Emergency Department and triage nurse.  Should you have questions after your visit or need to cancel or reschedule your appointment, please contact [facility] - A DEPT OF [facility]  Dept: [phone]  and follow the prompts.  Office hours are 8:00 a.m. to 4:30 p.m. Monday - Friday. Please note that voicemails left after 4:00 p.m. may not be returned until the following business day.  We are closed weekends and major holidays. You have access to a nurse at all times for urgent questions. Please call the main number to the clinic Dept: [phone] and follow the prompts.   For any non-urgent questions, you may also contact your provider using MyChart. We now offer e-Visits for anyone 41 and older to request care online for non-urgent symptoms. For details visit [email or web address].   Also download the MyChart app! Go to the app store, search MyChart, open the app, select [facility], and log in with your MyChart username and password.

## 2024-05-04 ENCOUNTER — Other Ambulatory Visit: Payer: Self-pay

## 2024-05-08 ENCOUNTER — Other Ambulatory Visit: Payer: Self-pay

## 2024-05-10 ENCOUNTER — Other Ambulatory Visit (HOSPITAL_COMMUNITY): Payer: Self-pay | Admitting: Cardiology

## 2024-05-13 ENCOUNTER — Ambulatory Visit (HOSPITAL_COMMUNITY)
Admission: RE | Admit: 2024-05-13 | Discharge: 2024-05-13 | Disposition: A | Source: Ambulatory Visit | Attending: Cardiology | Admitting: Cardiology

## 2024-05-13 VITALS — BP 114/60 | HR 64 | Wt 219.0 lb

## 2024-05-13 DIAGNOSIS — Z955 Presence of coronary angioplasty implant and graft: Secondary | ICD-10-CM | POA: Diagnosis not present

## 2024-05-13 DIAGNOSIS — I7111 Aneurysm of the ascending aorta, ruptured: Secondary | ICD-10-CM | POA: Insufficient documentation

## 2024-05-13 DIAGNOSIS — D6481 Anemia due to antineoplastic chemotherapy: Secondary | ICD-10-CM | POA: Diagnosis not present

## 2024-05-13 DIAGNOSIS — I350 Nonrheumatic aortic (valve) stenosis: Secondary | ICD-10-CM | POA: Diagnosis present

## 2024-05-13 DIAGNOSIS — I5032 Chronic diastolic (congestive) heart failure: Secondary | ICD-10-CM | POA: Insufficient documentation

## 2024-05-13 DIAGNOSIS — G629 Polyneuropathy, unspecified: Secondary | ICD-10-CM | POA: Diagnosis not present

## 2024-05-13 DIAGNOSIS — I480 Paroxysmal atrial fibrillation: Secondary | ICD-10-CM | POA: Insufficient documentation

## 2024-05-13 DIAGNOSIS — I3139 Other pericardial effusion (noninflammatory): Secondary | ICD-10-CM | POA: Insufficient documentation

## 2024-05-13 DIAGNOSIS — N183 Chronic kidney disease, stage 3 unspecified: Secondary | ICD-10-CM | POA: Diagnosis not present

## 2024-05-13 DIAGNOSIS — I130 Hypertensive heart and chronic kidney disease with heart failure and stage 1 through stage 4 chronic kidney disease, or unspecified chronic kidney disease: Secondary | ICD-10-CM | POA: Insufficient documentation

## 2024-05-13 DIAGNOSIS — Z952 Presence of prosthetic heart valve: Secondary | ICD-10-CM | POA: Insufficient documentation

## 2024-05-13 DIAGNOSIS — I509 Heart failure, unspecified: Secondary | ICD-10-CM | POA: Diagnosis present

## 2024-05-13 DIAGNOSIS — C7982 Secondary malignant neoplasm of genital organs: Secondary | ICD-10-CM | POA: Insufficient documentation

## 2024-05-13 DIAGNOSIS — E785 Hyperlipidemia, unspecified: Secondary | ICD-10-CM | POA: Diagnosis not present

## 2024-05-13 DIAGNOSIS — C679 Malignant neoplasm of bladder, unspecified: Secondary | ICD-10-CM | POA: Diagnosis not present

## 2024-05-13 DIAGNOSIS — I2510 Atherosclerotic heart disease of native coronary artery without angina pectoris: Secondary | ICD-10-CM | POA: Insufficient documentation

## 2024-05-13 DIAGNOSIS — T451X5A Adverse effect of antineoplastic and immunosuppressive drugs, initial encounter: Secondary | ICD-10-CM | POA: Insufficient documentation

## 2024-05-13 DIAGNOSIS — Z923 Personal history of irradiation: Secondary | ICD-10-CM | POA: Insufficient documentation

## 2024-05-13 DIAGNOSIS — Z9221 Personal history of antineoplastic chemotherapy: Secondary | ICD-10-CM | POA: Diagnosis not present

## 2024-05-13 DIAGNOSIS — Z9889 Other specified postprocedural states: Secondary | ICD-10-CM | POA: Diagnosis not present

## 2024-05-13 NOTE — Patient Instructions (Signed)
 There has been no changes to your medications.  Blood work at next oncology lab draw.  Your physician has requested that you have an echocardiogram. Echocardiography is a painless test that uses sound waves to create images of your heart. It provides your doctor with information about the size and shape of your heart and how well your hearts chambers and valves are working. This procedure takes approximately one hour. There are no restrictions for this procedure. Please do NOT wear cologne, perfume, aftershave, or lotions (deodorant is allowed). Please arrive 15 minutes prior to your appointment time.  Please note: We ask at that you not bring children with you during ultrasound (echo/ vascular) testing. Due to room size and safety concerns, children are not allowed in the ultrasound rooms during exams. Our front office staff cannot provide observation of children in our lobby area while testing is being conducted. An adult accompanying a patient to their appointment will only be allowed in the ultrasound room at the discretion of the ultrasound technician under special circumstances. We apologize for any inconvenience.  Your physician recommends that you schedule a follow-up appointment in: 6 months.  If you have any questions or concerns before your next appointment please send us  a message through [facility] or call our office at [phone].    TO LEAVE A MESSAGE FOR THE NURSE SELECT OPTION 2, PLEASE LEAVE A MESSAGE INCLUDING: YOUR NAME DATE OF BIRTH CALL BACK NUMBER REASON FOR CALL**this is important as we prioritize the call backs  YOU WILL RECEIVE A CALL BACK THE SAME DAY AS LONG AS YOU CALL BEFORE 4:00 PM  At the [facility], you and your health needs are our priority. As part of our continuing mission to provide you with exceptional heart care, we have created designated Provider Care Teams. These Care Teams include your primary Cardiologist (physician) and Advanced  Practice Providers (APPs- Physician Assistants and Nurse Practitioners) who all work together to provide you with the care you need, when you need it.   You may see any of the following providers on your designated Care Team at your next follow up: Dr Toribio Fuel Dr Ezra Shuck Dr. Morene Brownie Greig Mosses, NP Caffie Shed, [place] [facility] [place], [place] Beckey Coe, NP Jordan Lee, NP Ellouise Class, NP Tinnie Redman, PharmD Jaun Bash, PharmD   Please be sure to bring in all your medications bottles to every appointment.    Thank you for choosing [facility]

## 2024-05-13 NOTE — Progress Notes (Signed)
 Patient ID: Luis [name], male   DOB: 1955-06-28, 69 y.o.   MRN: [id number] PCP: Dr. Johnny Cardiology: Dr. Rolan  CC: CHF  69 y.o. with history of CAD and moderate aortic stenosis presents for follow of AS and CAD. He had RCA PCI in 8/12. Metastatic bladder cancer previously treated with chemo and TURBT, now s/p cystoprostatectomy and lymph node dissection 11/24.  When I saw him in early 2016, he reported significant exertional dyspnea.  Did Lexiscan  Cardiolite  in 2/16.  This showed no ischemia or infarction.  Echo showed ?bicuspid aortic valve and probably moderate aortic stenosis. Given echo results RHC/LHC and TEE in 2/16. The RHC showed mildly elevated left and right heart filling pressures and the LHC showed nonobstructive CAD.  TEE confirmed bicuspid aortic valve with moderate AS. PFTs in 4/16 that were within normal limits. Started on Lasix  20 mg daily.  He seemed to improve.    Progressive dypnea in fall 2017.  RHC/LHC in 2017 showed mildly elevated left heart filling pressure and nonobstructive CAD.  TEE showed moderate aortic stenosis, bicuspid valve. Stable ascending aorta dilation.   Echo in 9/18 showed EF 60-65% with bicuspid aortic valve and moderate AS.  CTA chest showed 4.5 cm ascending aorta.  Repeat CTA chest in 8/19 showed stable 4.5 cm ascending aorta.  Echo in 8/20 showed EF 60-65%, moderate AS, 4.5 cm ascending aorta. CTA chest in 8/20 with 4.6 cm ascending aorta. Echo in 1/22 showed EF 60-65%, mild LVH, moderate AS with mean gradient 32 and AVA 1.6 cm^2, bicuspid aortic valve, ascending aorta 46 mm. CTA chest in 8/22 with 4.4 cm ascending aorta.   Echo 3/23 showed EF 60-65% with mild LVH, normal RV, bicuspid aortic valve with severe AS and mean gradient 48 mmHg, AVA 0.91 cm^2.  R/LHC showed mild CAD, patent RCA stents, normal PCWP, mildly elevated RA pressure, preserved CO.  Underwent AV replacement and replacement of ascending aorta with Dr. Lucas 5/23. Had post-op atrial  fibrillation, started on amiodarone /warfarin. Presented back to ED 1 week after discharge with LOC. Echo showed moderate pericardial effusion with maximal diameter of 3 cm over the right ventricle with some compression, LV and aortic valve function normal. Attempted pericardiocentesis in cath lab, but unsuccessful and placed sub-xiphoid pericardial window in OR, with 850 cc fluid removed. Discharged home, weight 275 lbs.  Echo 7/23 showed EF 60-65%, normal RV, stable bioprosthetic aortic valve.  Echo 8/24 showed EF 60-65%, normal RV, stable bioprosthetic aortic valve  He had hematuria and found to have recurrence of bladder cancer.  S/p cystoprostatectomy and lymph node dissection 03/03/23.    Admitted 12/24 with sepsis & AKI. BCx grew Enterococcus faecalis. ID consulted and he required vasopressors and ICU care. Echo showed EF 65-70%, RV normal. TEE showed EF 60-65%, RV normal, no valvular vegetation or thrombus. He was discharged home with PICC on IV abx.  RHC was done in 2/25 showing mean RA 12, PA 40/13, mean PCWP 11, CI 3.53, PVR 2.05, PAPi 2.75.  Lasix  restarted at 20 mg daily.   Today he returns for HF follow up with his wife. He still has nephrostomy tubes.  He finished radiation for bladder cancer not long ago and is on Enfortumab therapy. Main complaint is pain and tingling in hands and feet from neuropathy.  No exertional dyspnea but not very active.  He gets fatigued walking up stairs or a hill.  No chest pain.  No lightheadedness or palpitations.    ECG (personally reviewed): NSR,  RBBB  Labs (6/23): K 4.1, creatinine 1.0, hgb 8.3, Lp(a) 116 Labs (10/23): K 4.0, creatinine 1.34, LDL 71 Labs (7/24): K 3.7, creatinine 1.10, hgb 14.5 Labs (9/24): K 3.7, creatinine 1.19 Labs (11/24): K 3.5, creatinine 0.92, hgb 10.1 Labs (1/25): K 4.2, creatinine 1.98 Labs (2/25): transferrin saturation 13%, hgb 7.9, K 4.3, creatinine 2.07 Labs (5/25): K 4.3, creatinine 1.72, hgb 9.5 Labs (1/26): K 4,  creatinine 2.1, hgb 8.6  PMH: 1. OSA: Using CPAP.  2. CAD: Cardiolite  8/12 with inferior infarct and peri-infarct ischemia, had PCI to the mid and distal RCA with Promus DES x 2.  Lexiscan  Cardiolite  (2/16) with no ischemia or infarction. LHC/RHC (2/16) with mean RA 12, PA 32/15, mean PCWP 18, CI 3.47; patent mid and distal RCA stents, 50-60% proximal stenosis small PDA.   - LHC/RHC (9/17): 60% proximal PDA; mean RA 2, PA 24/8, mean PCWP 18, CI 2.13.  - Cardiolite  (8/17): EF 56%, normal.  - R/LHC (3/23, AVR work up): mid RCA lesion 40% stenosis, previously placed prox RCA to Mid RCA stent (unknown type) widely patent, previously placed dist RCA stent (unknown type) widely patent; mean RA 13, PA 23/8, mean PCWP 11, CI 2.65.  3. Carotid stenosis: 2/15 carotid dopplers 1-39% bilateral ICA stenosis.  4. Colon polyps 5. HTN 6. Hyperlipidemia 7. Abdominal US  (10/12) with no AAA 8. Aortic stenosis: Echo (2/15) with EF 60-65%, mild LVH, moderate diastolic dysfunction, normal RV size/systolic function, moderate aortic stenosis with mean gradient 31 and AVA 1.16 cm2, ascending aorta 4.4 cm.  Echo (2/16) with EF 60-65%, grade II diastolic dysfunction, ?bicuspid aortic valve with moderate AS (mean gradient 35 mmHg, AVA 1.5 cm^2), mildly dilated RV with normal systolic function.  TEE (2/16) with bicuspid aortic valve, moderate AS mean gradient 30 mmHg, AVA > 1 cm^2, EF 55-60%, mild LVH, mildly dilated RV with normal systolic function, mild AI, mild MR, ascending aorta 4.2 cm.  - Echo (8/17): EF 60-65%, grade II diastolic dysfunction, bicuspid aortic valve with mean gradient 31 mmHg, probably moderate stenosis.  - TEE (9/17): EF 55-60%, bicuspid aortic valve with moderate AS with mean gradient 24 mmHg and AVA 1.05 cm^2, ascending aorta 4.2 cm.  - Echo (9/18): EF 60-65%, bicuspid aortic valve, AVA 1.05 cm^2 with mean gradient 24 mmHg (moderate AS).  - Echo (8/20): EF 60-65%, mild RV dilation with normal systolic  function, moderate AS with AVA 1.46 cm^2 and mean gradient 36 mmHg, 4.5 cm ascending aorta.  - Echo (1/22): EF 60-65%, mild LVH, moderate AS with mean gradient 32 and AVA 1.6 cm^2, bicuspid aortic valve, ascending aorta 46 mm.  - Echo (3/23): EF 60-65% with mild LVH, normal RV, bicuspid aortic valve with severe AS and mean gradient 48 mmHg, AVA 0.91 cm^2. 4.5 cm ascending aorta.  - s/p bioprosthetic AVR with a 25 mm Edwards Resilia Aortic Valve and Replacement of Ascending Aorta (5/23). - Echo (7/23): EF 60-65%, normal RV, stable bioprosthetic aortic valve.  - Echo (8/24): EF 60-65%, normal RV, stable bioprosthetic aortic valve - Echo (12/24): EF 60-65%, normal RV, stable bioprosthetic aortic valve. - TEE (12/24): EF 60-65%, normal RV, stable bioprosthetic aortic valve, no vegetation or thrombus. - RHC (2/25): mean RA 12, PA 40/13, mean PCWP 11, CI 3.53, PVR 2.05, PAPi 2.75. 9. Ascending aorta aneurysm: 4.4 cm on 2/15 echo. Ascending aorta 4.2 cm TEE 2/16.  Ascending aorta 4.2 cm on 9/17 TEE.  - CTA chest (9/18): 4.5 cm ascending aorta.  - CTA chest (  8/19): 4.5 cm ascending aorta - CTA chest (8/20): 4.6 cm ascending aorta - CTA chest (8/22): 4.4 cm ascending aorta - replacement of ascending aorta 5/23. 10. Chronic diastolic CHF.  11. PFTs (4/16) were within normal limits.  12. Lower extremity arterial dopplers normal in 8/17.  13. Bladder cancer: S/p chemo and TURBT.  Recurrence with cystoprostatectomy and lymph node dissection 03/03/23. - Radiation in 2025 - current chemotherapy with Enfortumab - Has nephrostomy tubes.  14. Pericardial tamponade with pericardial window s/p AVR in 5/23.  15. Atrial fibrillation: Paroxysmal, noted post-op AVR.  16. Anemia: Fe deficiency.  17. Peripheral neuropathy: Related to chemotherapy 18. CKD stage 3: Nephrostomy tubes in setting of bladder cancer.   SH: Quit smoking in 2013, married, works as curator.  FH: Father with MI, AVR.  Sister with RyR2  gene.   ROS: All systems reviewed and negative except as per HPI.   Current Outpatient Medications  Medication Sig Dispense Refill   acetaminophen  (TYLENOL ) 325 MG tablet Take 650 mg by mouth every 6 (six) hours as needed (for pain).     amLODipine  (NORVASC ) 5 MG tablet TAKE 1 TABLET (5 MG TOTAL) BY MOUTH DAILY. 90 tablet 1   aspirin  EC 81 MG tablet Take 81 mg by mouth in the morning.     carvedilol  (COREG ) 3.125 MG tablet TAKE 1 TABLET BY MOUTH TWICE A DAY WITH FOOD 180 tablet 1   cyanocobalamin  (VITAMIN B12) 1000 MCG tablet Take 1 tablet (1,000 mcg total) by mouth daily. 30 tablet 6   diazepam  (VALIUM ) 5 MG tablet TAKE 1 TABLET BY MOUTH EVERY 12 HOURS AS NEEDED FOR ANXIETY. 60 tablet 5   famotidine  (PEPCID ) 20 MG tablet Take 1 tablet (20 mg total) by mouth 2 (two) times daily for 6 days. 12 tablet 0   ferrous sulfate  325 (65 FE) MG tablet Take 1 tablet (325 mg total) by mouth daily with breakfast. Please take with a source of Vitamin C 90 tablet 3   furosemide  (LASIX ) 20 MG tablet TAKE 3 TABLETS BY MOUTH EVERY MORNING AND TAKE 2 TABLETS BY MOUTH EVERY EVENING 150 tablet 3   ipratropium (ATROVENT ) 0.03 % nasal spray Place 2 sprays into both nostrils every 12 (twelve) hours as needed for rhinitis.     lidocaine -prilocaine  (EMLA ) cream Apply topically once a week. 30 g 0   Magnesium  500 MG TABS Take 500 mg by mouth every morning.     meclizine  (ANTIVERT ) 25 MG tablet Take 1 tablet (25 mg total) by mouth 3 (three) times daily as needed for dizziness. 30 tablet 0   mirtazapine  (REMERON ) 7.5 MG tablet TAKE 1 TABLET BY MOUTH AT BEDTIME. 90 tablet 1   multivitamin-iron-minerals-folic acid  (CENTRUM) chewable tablet Chew 1 tablet by mouth daily.     nitroGLYCERIN  (NITROSTAT ) 0.4 MG SL tablet Place 0.4 mg under the tongue every 5 (five) minutes as needed for chest pain.     ondansetron  (ZOFRAN ) 8 MG tablet Take 1 tablet (8 mg total) by mouth every 8 (eight) hours as needed. 30 tablet 0   pantoprazole   (PROTONIX ) 40 MG tablet TAKE 1 TABLET (40 MG TOTAL) BY MOUTH TWICE A DAY BEFORE MEALS 180 tablet 1   potassium chloride  SA (KLOR-CON  M) 20 MEQ tablet Take 1 tablet (20 mEq total) by mouth 2 (two) times daily. 60 tablet 2   prochlorperazine  (COMPAZINE ) 10 MG tablet Take 1 tablet (10 mg total) by mouth every 6 (six) hours as needed for nausea or vomiting. 30  tablet 0   rosuvastatin  (CRESTOR ) 20 MG tablet Take 1 tablet (20 mg total) by mouth daily. 90 tablet 3   temazepam  (RESTORIL ) 30 MG capsule Take 1 capsule (30 mg total) by mouth at bedtime as needed for sleep. 90 capsule 1   traMADol  (ULTRAM ) 50 MG tablet Take 1 tablet (50 mg total) by mouth every 6 (six) hours as needed. 30 tablet 0   venlafaxine  XR (EFFEXOR -XR) 150 MG 24 hr capsule Take 1 capsule (150 mg total) by mouth daily with breakfast. 90 capsule 3   No current facility-administered medications for this encounter.   Wt Readings from Last 3 Encounters:  05/13/24 99.3 kg (219 lb)  05/03/24 98.7 kg (217 lb 11.2 oz)  04/26/24 100 kg (220 lb 8 oz)   BP 114/60   Pulse 64   Wt 99.3 kg (219 lb)   SpO2 100%   BMI 35.35 kg/m   Physical Exam General: NAD Neck: No JVD, no thyromegaly or thyroid  nodule.  Lungs: Clear to auscultation bilaterally with normal respiratory effort. CV: Nondisplaced PMI.  Heart regular S1/S2, no S3/S4, 2/6 early SEM.  1+ ankle edema.  No carotid bruit.  Normal pedal pulses.  Abdomen: Soft, nontender, no hepatosplenomegaly, no distention.  Skin: Intact without lesions or rashes.  Neurologic: Alert and oriented x 3.  Psych: Normal affect. Extremities: No clubbing or cyanosis.  HEENT: Normal.   Assessment/Plan: 1. CAD: Prior PCI to RCA in 8/12.  LHC (9/17) with nonobstructive disease. He was started on Imdur for ?microvascular angina but he developed severe headaches without any improvement in symptoms.  Underwent coronary angiography (3/23) as part of AVR work up and showed patent RCA stents with  nonobstructive mild disease. No chest pain now.  - Continue ASA 81  - Continue Crestor , check lipids today.  2. Aortic stenosis: Bicuspid valve with severe AS on 3/23 echo.  He is now s/p bioprosthetic AV replacement and replacement of ascending aorta 5/23. TEE in 12/24 at time of Enterococcus faecalis bacteremia showed stable bioprosthetic valve. - I will arrange for repeat echo.  3. Hyperlipidemia: Continue Crestor , check lipids today.  4. Ascending aortic aneurysm: Associated with bicuspid aortic valve.  S/p replacement of ascending aorta 5/23.  5. Chronic diastolic CHF:  Lasix  restarted after 2/25 RHC with mildly elevated right-sided filling pressures. He does not look volume overloaded on exam. NYHA class II-III symptoms, I think deconditioning is likely the major cause of his exercise limitation.  Anemia also likely plays a role.  - Avoiding Jardiance  for now with bladder cancer and nephrostomy tubes.  - He only takes lasix  prn.  - Continue carvedilol  3.125 mg bid.  - I will arrange for repeat echo.  6. OSA: Continue CPAP.  7. HTN: BP controlled.  - Continue amlodipine  5 mg daily.  8. Pericardial effusion: s/p pericardial window 6/23 post-op.  9. Atrial fibrillation: Paroxysmal, only noted post-op. Anticoagulation was stopped with pericardial effusion and later hematuria. NSR today.  - If AF recurs, will need DOAC.  10. Metastatic Bladder CA: Treated with pembrolizumab . Now S/p cystoprostatectomy and lymph node dissection 03/03/23. He has completed radiation and is now taking Enfortumab.   11. CKD stage 3: Check BMET today.   12. Anemia: Due to chemotherapy.  13. Peripheral neuropathy: Suspect this is due to chemotherapy.    Follow up in 6 mos with APP.   I spent 32 minutes reviewing records, interviewing/examining patient, and managing orders.   Ezra Shuck,  05/13/2024

## 2024-05-14 ENCOUNTER — Other Ambulatory Visit: Payer: Self-pay

## 2024-05-17 ENCOUNTER — Inpatient Hospital Stay

## 2024-05-17 ENCOUNTER — Inpatient Hospital Stay: Admitting: Hematology and Oncology

## 2024-05-17 ENCOUNTER — Other Ambulatory Visit (HOSPITAL_COMMUNITY): Payer: Self-pay

## 2024-05-17 VITALS — BP 126/65 | HR 55 | Temp 97.6°F | Resp 16 | Ht 66.0 in | Wt 223.0 lb

## 2024-05-17 DIAGNOSIS — Z95828 Presence of other vascular implants and grafts: Secondary | ICD-10-CM | POA: Diagnosis not present

## 2024-05-17 DIAGNOSIS — Z5112 Encounter for antineoplastic immunotherapy: Secondary | ICD-10-CM

## 2024-05-17 DIAGNOSIS — I5032 Chronic diastolic (congestive) heart failure: Secondary | ICD-10-CM

## 2024-05-17 DIAGNOSIS — E876 Hypokalemia: Secondary | ICD-10-CM

## 2024-05-17 DIAGNOSIS — C679 Malignant neoplasm of bladder, unspecified: Secondary | ICD-10-CM

## 2024-05-17 DIAGNOSIS — R7989 Other specified abnormal findings of blood chemistry: Secondary | ICD-10-CM | POA: Diagnosis not present

## 2024-05-17 LAB — CMP (CANCER CENTER ONLY)
ALT: 15 U/L (ref 0–44)
AST: 28 U/L (ref 15–41)
Albumin: 3.6 g/dL (ref 3.5–5.0)
Alkaline Phosphatase: 132 U/L — ABNORMAL HIGH (ref 38–126)
Anion gap: 11 (ref 5–15)
BUN: 26 mg/dL — ABNORMAL HIGH (ref 8–23)
CO2: 23 mmol/L (ref 22–32)
Calcium: 8.5 mg/dL — ABNORMAL LOW (ref 8.9–10.3)
Chloride: 103 mmol/L (ref 98–111)
Creatinine: 1.89 mg/dL — ABNORMAL HIGH (ref 0.61–1.24)
GFR, Estimated: 38 mL/min — ABNORMAL LOW
Glucose, Bld: 144 mg/dL — ABNORMAL HIGH (ref 70–99)
Potassium: 3.9 mmol/L (ref 3.5–5.1)
Sodium: 137 mmol/L (ref 135–145)
Total Bilirubin: 0.7 mg/dL (ref 0.0–1.2)
Total Protein: 7.3 g/dL (ref 6.5–8.1)

## 2024-05-17 LAB — CBC WITH DIFFERENTIAL (CANCER CENTER ONLY)
Abs Immature Granulocytes: 0.02 K/uL (ref 0.00–0.07)
Basophils Absolute: 0 K/uL (ref 0.0–0.1)
Basophils Relative: 1 %
Eosinophils Absolute: 0.1 K/uL (ref 0.0–0.5)
Eosinophils Relative: 3 %
HCT: 24.6 % — ABNORMAL LOW (ref 39.0–52.0)
Hemoglobin: 8.2 g/dL — ABNORMAL LOW (ref 13.0–17.0)
Immature Granulocytes: 1 %
Lymphocytes Relative: 14 %
Lymphs Abs: 0.5 K/uL — ABNORMAL LOW (ref 0.7–4.0)
MCH: 31.1 pg (ref 26.0–34.0)
MCHC: 33.3 g/dL (ref 30.0–36.0)
MCV: 93.2 fL (ref 80.0–100.0)
Monocytes Absolute: 0.5 K/uL (ref 0.1–1.0)
Monocytes Relative: 14 %
Neutro Abs: 2.3 K/uL (ref 1.7–7.7)
Neutrophils Relative %: 67 %
Platelet Count: 106 K/uL — ABNORMAL LOW (ref 150–400)
RBC: 2.64 MIL/uL — ABNORMAL LOW (ref 4.22–5.81)
RDW: 19.9 % — ABNORMAL HIGH (ref 11.5–15.5)
WBC Count: 3.4 K/uL — ABNORMAL LOW (ref 4.0–10.5)
nRBC: 0 % (ref 0.0–0.2)

## 2024-05-17 MED ORDER — SODIUM CHLORIDE 0.9 % IV SOLN: INTRAVENOUS | Status: DC

## 2024-05-17 MED ORDER — PROCHLORPERAZINE MALEATE 10 MG PO TABS
10.0000 mg | ORAL_TABLET | Freq: Once | ORAL | Status: AC
  Administered 2024-05-17: 10 mg via ORAL
  Filled 2024-05-17: qty 1

## 2024-05-17 MED ORDER — SODIUM CHLORIDE 0.9 % IV SOLN
125.0000 mg | Freq: Once | INTRAVENOUS | Status: AC
  Administered 2024-05-17: 125 mg via INTRAVENOUS
  Filled 2024-05-17: qty 8.5

## 2024-05-17 NOTE — Progress Notes (Signed)
 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 Federico  08/12/2022: Cycle 1 of Pembrolizumab  09/02/2022: Cycle 2 of Pembrolizumab   09/23/2022: Cycle 3 of Pembrolizumab  10/14/2022: Cycle 4 of Pembrolizumab  11/04/2022: Cycle 5 of Pembrolizumab  11/25/2022: Cycle 6 of Pembrolizumab  12/20/2022: Cycle 7 of Pembrolizumab  01/13/2023: Cycle 8 of Pembrolizumab   03/03/2023: Cystectomy/Prostatectomy showed infiltrative high-grade urothelial carcinoma, size 5.8 cm involving bladder dome, anterior wall and prostatic stroma. Tumor invades directly into prostatic stroma at apex and mid portion of the gland (pT4a). Metastatic urothelial cancer in one of two left common iliac lymph nodes.  06/19/2023-07/27/2023: Received adjuvant radiation to surgical bed.  He received 50.4 Gray in 28 fractions. 11/02/2023-Present: Started Cycle 1 Day 1 of Enfortumab.   CHIEF COMPLAINTS/PURPOSE OF CONSULTATION:  High Risk Non-Muscle Invasive Bladder Cancer   HISTORY OF PRESENTING ILLNESS:  Luis [name] 69 y.o. [name] with medical history significant for metastatic bladder cancer who presents for a  follow up visit.  He was last seen on 05/03/2024.  He presents today to continue Enfortumab treatment.   On exam today Luis [name] reports he is ready for the upcoming winter storm.  He reports he has been feeling well and eating well.  He reports that he feels like he has been sleeping a little more.  He reports that his weight has been increasing slightly up to 223 pounds from 217 pounds at our last visit.  He reports that he has been doing his best to try to eat more.  Has had no lightheadedness, dizziness, shortness of breath.  He does have very easy on and off nosebleeds and reports they happen every 3 to 4 days.  He reports the dry air may be contributing.  He said no trouble with nausea, vomiting, or diarrhea.  He denies any fevers, chills, sweats, runny nose, sore throat, cough.  He reports that he does not have any trouble with his nephrostomy tubes such as pain or bloody discharge.  Overall he is willing and able to continue on Fortamet therapy at this time.  A full 10 point ROS is otherwise negative.  MEDICAL HISTORY:  Past Medical History:  Diagnosis Date   Anxiety    takes Valium  as needed   Aortic stenosis, moderate    Arthritis    Ascending aortic aneurysm    CAD (coronary artery disease)    a. s/p PCI of the RCA 8/12 with DES by Dr Wonda, preserved EF. b. LHC/RHC (2/16) with mean RA 12, PA 32/15, mean PCWP 18, CI 3.47; patent mid and distal RCA stents, 50-60% proximal stenosis small PDA.      [facility])    bladder   Carotid stenosis    a. Carotid US  (05/2013):  Bilateral 1-39% ICA; L thyroid  nodule (prior hx of aspiration).  Chronic diastolic CHF (congestive heart failure) (HCC)    Complication of anesthesia    difficulty waking up after gallbladder surgery   Depression    Dyspnea    Essential hypertension    GERD (gastroesophageal reflux disease)    if needed will take OTC meds    Heart murmur    History of colonic polyps    hyperplastic   Hyperlipidemia    Joint pain     Lesion of bladder    Myocardial infarction (HCC) 2012   Obesity (BMI 30-39.9) 02/29/2016   Pre-diabetes    Restless leg    Sleep apnea    uses cpap   Tubular adenoma of colon    Vertigo    takes Meclizine  as needed    SURGICAL HISTORY: Past Surgical History:  Procedure Laterality Date   AORTIC VALVE REPLACEMENT N/A 09/16/2021   Procedure: AORTIC VALVE REPLACEMENT (AVR);  Surgeon: Lucas Dorise POUR, MD;  Location: [facility] OR;  Service: Open Heart Surgery;  Laterality: N/A;   CATARACT EXTRACTION   4 YRS AGO   BOTH EYES   CHOLECYSTECTOMY  07/21/2011   Procedure: LAPAROSCOPIC CHOLECYSTECTOMY WITH INTRAOPERATIVE CHOLANGIOGRAM;  Surgeon: Alm VEAR Angle, MD;  Location: [facility] ORS;  Service: General;  Laterality: N/A;   CORONARY ANGIOPLASTY  2012   2 stents   coronary stenting     s/p PCI of the RCA by Dr Wonda 8/12 with 2 promus stents   CYSTOSCOPY W/ RETROGRADES Bilateral 12/13/2019   Procedure: CYSTOSCOPY WITH RETROGRADE PYELOGRAM;  Surgeon: Alvaro Hummer, MD;  Location: [facility];  Service: Urology;  Laterality: Bilateral;   CYSTOSCOPY W/ RETROGRADES Bilateral 10/14/2020   Procedure: CYSTOSCOPY WITH RETROGRADE PYELOGRAM;  Surgeon: Alvaro Hummer, MD;  Location: [facility];  Service: Urology;  Laterality: Bilateral;   CYSTOSCOPY W/ RETROGRADES Bilateral 12/16/2020   Procedure: CYSTOSCOPY WITH RETROGRADE PYELOGRAM;  Surgeon: Alvaro Hummer, MD;  Location: [facility];  Service: Urology;  Laterality: Bilateral;   CYSTOSCOPY W/ RETROGRADES Bilateral 06/03/2022   Procedure: CYSTOSCOPY WITH RETROGRADE PYELOGRAM;  Surgeon: Alvaro Hummer, MD;  Location: [facility];  Service: Urology;  Laterality: Bilateral;   CYSTOSCOPY W/ RETROGRADES Bilateral 12/28/2022   Procedure: CYSTOSCOPY WITH RETROGRADE PYELOGRAM, FULGARATION OF BLEEDERS;  Surgeon: Alvaro Hummer KATHEE Mickey., MD;  Location: [facility];  Service: Urology;  Laterality: Bilateral;   CYSTOSCOPY WITH INJECTION N/A  03/03/2023   Procedure: CYSTOSCOPY WITH INDOCYANINE INJECTION;  Surgeon: Alvaro Hummer KATHEE Mickey., MD;  Location: [facility];  Service: Urology;  Laterality: N/A;  360 MINUTES   ESOPHAGOGASTRODUODENOSCOPY N/A 06/28/2023   Procedure: EGD (ESOPHAGOGASTRODUODENOSCOPY);  Surgeon: Charlanne Groom, MD;  Location: [facility];  Service: Gastroenterology;  Laterality: N/A;   IR IMAGING GUIDED PORT INSERTION  05/29/2023   IR NEPHROSTOMY EXCHANGE LEFT  08/07/2023   IR NEPHROSTOMY EXCHANGE LEFT  10/02/2023   IR NEPHROSTOMY EXCHANGE LEFT  11/27/2023   IR NEPHROSTOMY EXCHANGE LEFT  01/10/2024   IR NEPHROSTOMY EXCHANGE LEFT  02/21/2024   IR NEPHROSTOMY EXCHANGE LEFT  04/03/2024   IR NEPHROSTOMY PLACEMENT RIGHT  07/01/2023   IR RADIOLOGY PERIPHERAL GUIDED IV START  07/01/2023   IR US  GUIDE VASC ACCESS LEFT  07/01/2023   LEFT AND RIGHT HEART CATHETERIZATION WITH CORONARY ANGIOGRAM N/A 06/23/2014   Procedure: LEFT AND RIGHT HEART CATHETERIZATION WITH CORONARY ANGIOGRAM;  Surgeon: Ezra GORMAN Shuck, MD;  Location: [facility] CATH LAB;  Service: Cardiovascular;  Laterality: N/A;   NECK SURGERY  03/23/09   per Dr. Barbarann, cervical fusion  PERICARDIOCENTESIS N/A 09/28/2021   Procedure: PERICARDIOCENTESIS;  Surgeon: Dann Candyce RAMAN, MD;  Location: [facility] INVASIVE CV LAB;  Service: Cardiovascular;  Laterality: N/A;   REPLACEMENT ASCENDING AORTA N/A 09/16/2021   Procedure: REPLACEMENT ASCENDING AORTA WITH 30 X HEMASHIELD PLATINUM WOVEN DOUBLE VELOUR VASCULAR GRAFT;  Surgeon: Lucas Dorise POUR, MD;  Location: [facility] OR;  Service: Open Heart Surgery;  Laterality: N/A;  CIRC ARREST   right elbow surgery     RIGHT HEART CATH N/A 06/15/2023   Procedure: RIGHT HEART CATH;  Surgeon: Rolan Ezra RAMAN, MD;  Location: [facility] INVASIVE CV LAB;  Service: Cardiovascular;  Laterality: N/A;   RIGHT HEART CATH AND CORONARY ANGIOGRAPHY N/A 07/08/2021   Procedure: RIGHT HEART CATH AND CORONARY ANGIOGRAPHY;  Surgeon: Rolan Ezra RAMAN, MD;  Location: [facility] INVASIVE CV LAB;   Service: Cardiovascular;  Laterality: N/A;   ROBOT ASSISTED LAPAROSCOPIC COMPLETE CYSTECT ILEAL CONDUIT N/A 03/03/2023   Procedure: XI ROBOTIC ASSISTED LAPAROSCOPIC COMPLETE CYSTECTECTOMY WITH  ILEAL CONDUIT DIVERSION;  Surgeon: Alvaro Ricardo KATHEE Mickey., MD;  Location: [facility];  Service: Urology;  Laterality: N/A;   ROBOT ASSISTED LAPAROSCOPIC RADICAL PROSTATECTOMY N/A 03/03/2023   Procedure: XI ROBOTIC ASSISTED LAPAROSCOPIC RADICAL PROSTATECTOMY WITH LYMPH NODE DISSECTION;  Surgeon: Alvaro Ricardo KATHEE Mickey., MD;  Location: [facility];  Service: Urology;  Laterality: N/A;   solonscopy  05/23/08   per Dr. Jakie inch hemorrhoids only, repeat in 5 years   SUBXYPHOID PERICARDIAL WINDOW N/A 09/28/2021   Procedure: SUBXYPHOID PERICARDIAL WINDOW;  Surgeon: Lucas Dorise POUR, MD;  Location: [facility] OR;  Service: Thoracic;  Laterality: N/A;   TEE WITHOUT CARDIOVERSION N/A 06/23/2014   Procedure: TRANSESOPHAGEAL ECHOCARDIOGRAM (TEE);  Surgeon: Ezra RAMAN Rolan, MD;  Location: [facility] ENDOSCOPY;  Service: Cardiovascular;  Laterality: N/A;   TEE WITHOUT CARDIOVERSION N/A 01/21/2016   Procedure: TRANSESOPHAGEAL ECHOCARDIOGRAM (TEE);  Surgeon: Ezra RAMAN Rolan, MD;  Location: [facility] ENDOSCOPY;  Service: Cardiovascular;  Laterality: N/A;   TEE WITHOUT CARDIOVERSION N/A 09/16/2021   Procedure: TRANSESOPHAGEAL ECHOCARDIOGRAM (TEE);  Surgeon: Lucas Dorise POUR, MD;  Location: [facility] OR;  Service: Open Heart Surgery;  Laterality: N/A;   TEE WITHOUT CARDIOVERSION N/A 09/28/2021   Procedure: TRANSESOPHAGEAL ECHOCARDIOGRAM (TEE);  Surgeon: Lucas Dorise POUR, MD;  Location: [facility] OR;  Service: Thoracic;  Laterality: N/A;   TONSILLECTOMY     TRANSESOPHAGEAL ECHOCARDIOGRAM (CATH LAB) N/A 04/17/2023   Procedure: TRANSESOPHAGEAL ECHOCARDIOGRAM;  Surgeon: Jeffrie Oneil BROCKS, MD;  Location: [facility] INVASIVE CV LAB;  Service: Cardiovascular;  Laterality: N/A;   TRANSURETHRAL RESECTION OF BLADDER TUMOR N/A 10/21/2019   Procedure: TRANSURETHRAL RESECTION OF BLADDER TUMOR  (TURBT);  Surgeon: Ottelin, Mark, MD;  Location: [facility];  Service: Urology;  Laterality: N/A;   TRANSURETHRAL RESECTION OF BLADDER TUMOR N/A 12/13/2019   Procedure: TRANSURETHRAL RESECTION OF BLADDER TUMOR (TURBT);  Surgeon: Alvaro Ricardo, MD;  Location: [facility];  Service: Urology;  Laterality: N/A;  1 HR   TRANSURETHRAL RESECTION OF BLADDER TUMOR N/A 10/14/2020   Procedure: TRANSURETHRAL RESECTION OF BLADDER TUMOR (TURBT);  Surgeon: Alvaro Ricardo, MD;  Location: [facility];  Service: Urology;  Laterality: N/A;   TRANSURETHRAL RESECTION OF BLADDER TUMOR N/A 12/16/2020   Procedure: RESTAGING TRANSURETHRAL RESECTION OF BLADDER TUMOR (TURBT);  Surgeon: Alvaro Ricardo, MD;  Location: [facility];  Service: Urology;  Laterality: N/A;   TRANSURETHRAL RESECTION OF BLADDER TUMOR N/A 06/03/2022   Procedure: TRANSURETHRAL RESECTION OF BLADDER TUMOR (TURBT);  Surgeon: Alvaro Ricardo, MD;  Location: [facility];  Service:  Urology;  Laterality: N/A;   UMBILICAL HERNIA REPAIR  03/03/2023   Procedure: HERNIA REPAIR UMBILICAL;  Surgeon: Alvaro Ricardo KATHEE Mickey., MD;  Location: [facility];  Service: Urology;;    SOCIAL HISTORY: Social History   Socioeconomic History   Marital status: Married    Spouse name: Not on file   Number of children: 4   Years of education: Not on file   Highest education level: Not on file  Occupational History   Occupation: Public House Manager: [employer]  Tobacco Use   Smoking status: Former    Current packs/day: 0.00    Average packs/day: 2.0 packs/day for 40.0 years (80.0 ttl pk-yrs)    Types: Cigarettes    Start date: 10    Quit date: 2012    Years since quitting: 14.0    Passive exposure: Never   Smokeless tobacco: Never  Vaping Use   Vaping status: Never Used  Substance and Sexual Activity   Alcohol use: No    Alcohol/week: 0.0 standard drinks of alcohol   Drug use: No   Sexual  activity: Not on file  Other Topics Concern   Not on file  Social History Narrative   Not on file   Social Drivers of Health   Tobacco Use: Medium Risk (02/05/2024)   Patient History    Smoking Tobacco Use: Former    Smokeless Tobacco Use: Never    Passive Exposure: Never  Physicist, Medical Strain: Low Risk (02/15/2024)   Overall Financial Resource Strain (CARDIA)    Difficulty of Paying Living Expenses: Not hard at all  Food Insecurity: No Food Insecurity (02/15/2024)   Epic    Worried About Radiation Protection Practitioner of Food in the Last Year: Never true    Ran Out of Food in the Last Year: Never true  Transportation Needs: No Transportation Needs (02/15/2024)   Epic    Lack of Transportation (Medical): No    Lack of Transportation (Non-Medical): No  Physical Activity: Inactive (02/15/2024)   Exercise Vital Sign    Days of Exercise per Week: 0 days    Minutes of Exercise per Session: Not on file  Stress: Stress Concern Present (02/15/2024)   [employer] of Occupational Health - Occupational Stress Questionnaire    Feeling of Stress: To some extent  Social Connections: Unknown (02/15/2024)   Social Connection and Isolation Panel    Frequency of Communication with Friends and Family: Once a week    Frequency of Social Gatherings with Friends and Family: More than three times a week    Attends Religious Services: Patient declined    Database Administrator or Organizations: Patient declined    Attends Banker Meetings: Not on file    Marital Status: Married  Intimate Partner Violence: Not At Risk (07/04/2023)   Humiliation, Afraid, Rape, and Kick questionnaire    Fear of Current or Ex-Partner: No    Emotionally Abused: No    Physically Abused: No    Sexually Abused: No  Depression (PHQ2-9): Low Risk (05/03/2024)   Depression (PHQ2-9)    PHQ-2 Score: 0  Alcohol Screen: Low Risk (05/09/2023)   Alcohol Screen    Last Alcohol Screening Score (AUDIT): 0  Housing: Low Risk  (02/15/2024)   Epic    Unable to Pay for Housing in the Last Year: No    Number of Times Moved in the Last Year: 0    Homeless in the Last Year: No  Utilities: Not  At Risk (07/04/2023)   AHC Utilities    Threatened with loss of utilities: No  Health Literacy: Adequate Health Literacy (11/03/2022)   B1300 Health Literacy    Frequency of need for help with medical instructions: Rarely    FAMILY HISTORY: Family History  Problem Relation Age of Onset   Lung cancer Mother        lung   Esophageal cancer Cousin    Colon cancer Neg Hx    Rectal cancer Neg Hx    Stomach cancer Neg Hx     ALLERGIES:  has no known allergies.  MEDICATIONS:  Current Outpatient Medications  Medication Sig Dispense Refill   acetaminophen  (TYLENOL ) 325 MG tablet Take 650 mg by mouth every 6 (six) hours as needed (for pain).     amLODipine  (NORVASC ) 5 MG tablet TAKE 1 TABLET (5 MG TOTAL) BY MOUTH DAILY. 90 tablet 1   aspirin  EC 81 MG tablet Take 81 mg by mouth in the morning.     carvedilol  (COREG ) 3.125 MG tablet TAKE 1 TABLET BY MOUTH TWICE A DAY WITH FOOD 180 tablet 1   cyanocobalamin  (VITAMIN B12) 1000 MCG tablet Take 1 tablet (1,000 mcg total) by mouth daily. 30 tablet 6   diazepam  (VALIUM ) 5 MG tablet TAKE 1 TABLET BY MOUTH EVERY 12 HOURS AS NEEDED FOR ANXIETY. 60 tablet 5   famotidine  (PEPCID ) 20 MG tablet Take 1 tablet (20 mg total) by mouth 2 (two) times daily for 6 days. 12 tablet 0   ferrous sulfate  325 (65 FE) MG tablet Take 1 tablet (325 mg total) by mouth daily with breakfast. Please take with a source of Vitamin C 90 tablet 3   furosemide  (LASIX ) 20 MG tablet TAKE 3 TABLETS BY MOUTH EVERY MORNING AND TAKE 2 TABLETS BY MOUTH EVERY EVENING 150 tablet 3   ipratropium (ATROVENT ) 0.03 % nasal spray Place 2 sprays into both nostrils every 12 (twelve) hours as needed for rhinitis.     lidocaine -prilocaine  (EMLA ) cream Apply topically once a week. 30 g 0   Magnesium  500 MG TABS Take 500 mg by mouth every  morning.     meclizine  (ANTIVERT ) 25 MG tablet Take 1 tablet (25 mg total) by mouth 3 (three) times daily as needed for dizziness. 30 tablet 0   mirtazapine  (REMERON ) 7.5 MG tablet TAKE 1 TABLET BY MOUTH AT BEDTIME. 90 tablet 1   multivitamin-iron-minerals-folic acid  (CENTRUM) chewable tablet Chew 1 tablet by mouth daily.     nitroGLYCERIN  (NITROSTAT ) 0.4 MG SL tablet Place 0.4 mg under the tongue every 5 (five) minutes as needed for chest pain.     ondansetron  (ZOFRAN ) 8 MG tablet Take 1 tablet (8 mg total) by mouth every 8 (eight) hours as needed. 30 tablet 0   pantoprazole  (PROTONIX ) 40 MG tablet TAKE 1 TABLET (40 MG TOTAL) BY MOUTH TWICE A DAY BEFORE MEALS 180 tablet 1   potassium chloride  SA (KLOR-CON  M) 20 MEQ tablet Take 1 tablet (20 mEq total) by mouth 2 (two) times daily. 60 tablet 2   prochlorperazine  (COMPAZINE ) 10 MG tablet Take 1 tablet (10 mg total) by mouth every 6 (six) hours as needed for nausea or vomiting. 30 tablet 0   rosuvastatin  (CRESTOR ) 20 MG tablet Take 1 tablet (20 mg total) by mouth daily. 90 tablet 3   temazepam  (RESTORIL ) 30 MG capsule Take 1 capsule (30 mg total) by mouth at bedtime as needed for sleep. 90 capsule 1   traMADol  (ULTRAM ) 50 MG tablet  Take 1 tablet (50 mg total) by mouth every 6 (six) hours as needed. 30 tablet 0   venlafaxine  XR (EFFEXOR -XR) 150 MG 24 hr capsule Take 1 capsule (150 mg total) by mouth daily with breakfast. 90 capsule 3   No current facility-administered medications for this visit.    REVIEW OF SYSTEMS:   Constitutional: ( - ) fevers, ( - )  chills , ( - ) night sweats Eyes: ( - ) blurriness of vision, ( - ) double vision, ( - ) watery eyes Ears, nose, mouth, throat, and face: ( - ) mucositis, ( - ) sore throat Respiratory: ( - ) cough, ( - ) dyspnea, ( - ) wheezes Cardiovascular: ( - ) palpitation, ( - ) chest discomfort, ( - ) lower extremity swelling Gastrointestinal:  ( - ) nausea, ( - ) heartburn, ( - ) change in bowel  habits Skin: ( - ) abnormal skin rashes Lymphatics: ( - ) new lymphadenopathy, ( - ) easy bruising Neurological: ( - ) numbness, ( - ) tingling, ( - ) new weaknesses Behavioral/Psych: ( - ) mood change, ( - ) new changes  All other systems were reviewed with the patient and are negative.  PHYSICAL EXAMINATION: ECOG PERFORMANCE STATUS: 1 - Symptomatic but completely ambulatory  Vitals:   05/17/24 1350  BP: 126/65  Pulse: (!) 55  Resp: 16  Temp: 97.6 F (36.4 C)  SpO2: 100%     Filed Weights   05/17/24 1350  Weight: 223 lb (101.2 kg)     GENERAL: well appearing elderly Caucasian [name] in NAD  SKIN: skin color, texture, turgor are normal, no rashes or significant lesions EYES: conjunctiva are pink and non-injected, sclera clear LUNGS: clear to auscultation and percussion with normal breathing effort HEART: regular rate & rhythm and no murmurs and no lower extremity edema Musculoskeletal: no cyanosis of digits and no clubbing.  PSYCH: alert & oriented x 3, fluent speech NEURO: no focal motor/sensory deficits  LABORATORY DATA:  I have reviewed the data as listed    Latest Ref Rng & Units 05/17/2024    1:20 PM 05/03/2024   12:31 PM 04/26/2024    1:48 PM  CBC  WBC 4.0 - 10.5 K/uL 3.4  4.8  5.7   Hemoglobin 13.0 - 17.0 g/dL 8.2  8.6  7.8   Hematocrit 39.0 - 52.0 % 24.6  26.2  23.8   Platelets 150 - 400 K/uL 106  116  151        Latest Ref Rng & Units 05/17/2024    1:20 PM 05/03/2024   12:31 PM 04/26/2024    1:48 PM  CMP  Glucose 70 - 99 mg/dL 855  851  878   BUN 8 - 23 mg/dL 26  20  28    Creatinine 0.61 - 1.24 mg/dL 8.10  7.89  7.72   Sodium 135 - 145 mmol/L 137  137  133   Potassium 3.5 - 5.1 mmol/L 3.9  4.0  4.4   Chloride 98 - 111 mmol/L 103  104  101   CO2 22 - 32 mmol/L 23  23  23    Calcium  8.9 - 10.3 mg/dL 8.5  8.7  8.6   Total Protein 6.5 - 8.1 g/dL 7.3  7.4  7.2   Total Bilirubin 0.0 - 1.2 mg/dL 0.7  0.6  0.5   Alkaline Phos 38 - 126 U/L 132  131  132   AST 15  - 41 U/L 28  29  25   ALT 0 - 44 U/L 15  16  15      RADIOGRAPHIC STUDIES: CT OUTSIDE FILMS BODY/ABD/PELVIS Result Date: 04/23/2024 This examination belongs to an outside facility and is stored here for comparison purposes only.  Contact the originating outside institution for any associated report or interpretation.     ASSESSMENT & PLAN KATAI MARSICO is a 69 y.o. [name] with medical history significant for BCG unresponsive high risk nonmuscle invasive bladder cancer who presents to establish care.  After review of the labs, review of the records, and discussion with the patient the patients findings are most consistent with BCG unresponsive high risk non-muscle invasive bladder cancer, not a candidate for cystectomy.  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 21 days until progression or intolerance up to 24 months. --Received 8 cycles of Pembrolizumab  on 08/12/2022-01/13/2023: Cycle 8 of Pembrolizumab   --On 03/03/2023, underwent Cystectomy/Prostatectomy showed infiltrative high-grade urothelial carcinoma, size 5.8 cm involving bladder dome, anterior wall and prostatic stroma. Tumor invades directly into prostatic stroma at apex and mid portion of the gland (pT4a). Metastatic urothelial cancer in one of two left common iliac lymph nodes.  -- Given the results of the pathology showing positive margins and spread to the lymph node we are considering radiation therapy versus chemotherapy.  For chemotherapy, recommend gemcitabine  and cisplatin x 4 cycles.  This would consist of gemcitabine  1000 mg/m on days 1 and 8 and cisplatin 70 mg/m on day 1 of 21-day cycle for 4 cycles. -- Due to kidney dysfunction and anemia, chemotherapy was deferred and patient underwent adjuvant radiation to the surgical bed from 06/19/2023 until 07/27/2023.  He received 50.4 Gray in 28 fractions. PLAN:   --Today is Cycle 8, Day 1 of Enfortumab therapy --Labs from today show WBC 3.4, Hgb 8.2, MCV 93.2, Plt 106. Cr 1.89 --Last CT scan on 04/15/2024 showed response to treatment with no evidence of progression.  --Proceed with treatment today without any dose modifications.  --RTC weekly for treatment and every 2 weeks for clinic visit.  #Appetite and weight loss: --Weight is fluctuating with peripheral edema that comes and goes.  --Encouraged to eat small, frequent meals and supplement with protein shakes. He has a follow up with dietician next week --Monitor weight closely.   # Skin Rash--resolved -- Occurred after receiving blood transfusion, etiology not entirely clear. -- Patient has been applying moisturizer to the skin and has been taking Benadryl . -- Skin is persistently dry and itchy and erythematous. -- Completed steroid taper with 60 mg for 5 days, 40 for 5 days, and 20 for 5 days.  #Anemia -Labs today show Hgb is 8.2 overall stable.  --May be multifactorial due to nutritional deficiency as well as kidney dysfunction. -- Patient is currently taking vitamin B12 p.o. daily and ferrous sulfate  every other day. -- Receives vitamin B12 1000 mcg injection monthly, last received on 03/28/2024 -- Last received IV monoferric  1000 mg x 1 dose on 08/25/2023.  -- Most recent labs from 01/25/2024 show no evidence of iron or B12 deficiency.   #Hypokalemia: --Potassium level is 3.9 today --Patient is currently taking potassium supplement 20 meq BID. Advised to continue  #Flank pain--improved: --Secondary to bilateral nephrostomy.  --Patient has tylenol  and tramadol  50 mg PO q 6 hours as needed.   # AKI -- Creatinine is 1.89, improved today. He is scheduled for nephrostomy exchange next on 05/29/2024  #  Supportive Care -- chemotherapy education complete -- port placed -- zofran  8mg  q8H PRN and compazine  10mg  PO q6H for nausea -- EMLA  cream for port -- no pain medication required at this  time.    No orders of the defined types were placed in this encounter.  All questions were answered. The patient knows to call the clinic with any problems, questions or concerns.  I have spent a total of 30 minutes minutes of face-to-face and non-face-to-face time, preparing to see the patient, performing a medically appropriate examination, counseling and educating the patient, documenting clinical information in the electronic health record,and care coordination.   Norleen IVAR Kidney, MD Department of Hematology/Oncology [facility] at [facility] Phone: [phone] Pager: [phone] Email: [email or web address]     "

## 2024-05-17 NOTE — Patient Instructions (Signed)
 [facility] - A DEPT OF [facility]  Discharge Instructions: Thank you for choosing [facility] to provide your oncology and hematology care.   If you have a lab appointment with the Cancer Center, please go directly to the Cancer Center and check in at the registration area.   Wear comfortable clothing and clothing appropriate for easy access to any Portacath or PICC line.   We strive to give you quality time with your provider. You may need to reschedule your appointment if you arrive late (15 or more minutes).  Arriving late affects you and other patients whose appointments are after yours.  Also, if you miss three or more appointments without notifying the office, you may be dismissed from the clinic at the providers discretion.      For prescription refill requests, have your pharmacy contact our office and allow 72 hours for refills to be completed.    Today you received the following chemotherapy and/or immunotherapy agent: Enfortumab- Vedotin (Padcev )   To help prevent nausea and vomiting after your treatment, we encourage you to take your nausea medication as directed.  BELOW ARE SYMPTOMS THAT SHOULD BE REPORTED IMMEDIATELY: *FEVER GREATER THAN 100.4 F (38 C) OR HIGHER *CHILLS OR SWEATING *NAUSEA AND VOMITING THAT IS NOT CONTROLLED WITH YOUR NAUSEA MEDICATION *UNUSUAL SHORTNESS OF BREATH *UNUSUAL BRUISING OR BLEEDING *URINARY PROBLEMS (pain or burning when urinating, or frequent urination) *BOWEL PROBLEMS (unusual diarrhea, constipation, pain near the anus) TENDERNESS IN MOUTH AND THROAT WITH OR WITHOUT PRESENCE OF ULCERS (sore throat, sores in mouth, or a toothache) UNUSUAL RASH, SWELLING OR PAIN  UNUSUAL VAGINAL DISCHARGE OR ITCHING   Items with * indicate a potential emergency and should be followed up as soon as possible or go to the Emergency Department if any problems should occur.  Please show the CHEMOTHERAPY ALERT CARD or  IMMUNOTHERAPY ALERT CARD at check-in to the Emergency Department and triage nurse.  Should you have questions after your visit or need to cancel or reschedule your appointment, please contact [facility] - A DEPT OF [facility]  Dept: [phone]  and follow the prompts.  Office hours are 8:00 a.m. to 4:30 p.m. Monday - Friday. Please note that voicemails left after 4:00 p.m. may not be returned until the following business day.  We are closed weekends and major holidays. You have access to a nurse at all times for urgent questions. Please call the main number to the clinic Dept: [phone] and follow the prompts.   For any non-urgent questions, you may also contact your provider using MyChart. We now offer e-Visits for anyone 41 and older to request care online for non-urgent symptoms. For details visit [email or web address].   Also download the MyChart app! Go to the app store, search MyChart, open the app, select [facility], and log in with your MyChart username and password.

## 2024-05-19 ENCOUNTER — Encounter: Payer: Self-pay | Admitting: Hematology and Oncology

## 2024-05-24 ENCOUNTER — Other Ambulatory Visit (HOSPITAL_COMMUNITY): Payer: Self-pay | Admitting: Cardiology

## 2024-05-24 ENCOUNTER — Inpatient Hospital Stay: Admitting: Dietician

## 2024-05-24 ENCOUNTER — Inpatient Hospital Stay

## 2024-05-24 VITALS — BP 133/63 | HR 56 | Temp 98.0°F | Resp 16 | Wt 220.0 lb

## 2024-05-24 DIAGNOSIS — C679 Malignant neoplasm of bladder, unspecified: Secondary | ICD-10-CM

## 2024-05-24 DIAGNOSIS — Z95828 Presence of other vascular implants and grafts: Secondary | ICD-10-CM

## 2024-05-24 LAB — CBC WITH DIFFERENTIAL (CANCER CENTER ONLY)
Abs Immature Granulocytes: 0.01 10*3/uL (ref 0.00–0.07)
Basophils Absolute: 0 10*3/uL (ref 0.0–0.1)
Basophils Relative: 1 %
Eosinophils Absolute: 0.1 10*3/uL (ref 0.0–0.5)
Eosinophils Relative: 1 %
HCT: 25.1 % — ABNORMAL LOW (ref 39.0–52.0)
Hemoglobin: 8.3 g/dL — ABNORMAL LOW (ref 13.0–17.0)
Immature Granulocytes: 0 %
Lymphocytes Relative: 14 %
Lymphs Abs: 0.5 10*3/uL — ABNORMAL LOW (ref 0.7–4.0)
MCH: 31 pg (ref 26.0–34.0)
MCHC: 33.1 g/dL (ref 30.0–36.0)
MCV: 93.7 fL (ref 80.0–100.0)
Monocytes Absolute: 0.6 10*3/uL (ref 0.1–1.0)
Monocytes Relative: 14 %
Neutro Abs: 2.7 10*3/uL (ref 1.7–7.7)
Neutrophils Relative %: 70 %
Platelet Count: 103 10*3/uL — ABNORMAL LOW (ref 150–400)
RBC: 2.68 MIL/uL — ABNORMAL LOW (ref 4.22–5.81)
RDW: 18.9 % — ABNORMAL HIGH (ref 11.5–15.5)
WBC Count: 3.8 10*3/uL — ABNORMAL LOW (ref 4.0–10.5)
nRBC: 0 % (ref 0.0–0.2)

## 2024-05-24 LAB — CMP (CANCER CENTER ONLY)
ALT: 13 U/L (ref 0–44)
AST: 27 U/L (ref 15–41)
Albumin: 3.6 g/dL (ref 3.5–5.0)
Alkaline Phosphatase: 147 U/L — ABNORMAL HIGH (ref 38–126)
Anion gap: 11 (ref 5–15)
BUN: 26 mg/dL — ABNORMAL HIGH (ref 8–23)
CO2: 22 mmol/L (ref 22–32)
Calcium: 8.7 mg/dL — ABNORMAL LOW (ref 8.9–10.3)
Chloride: 104 mmol/L (ref 98–111)
Creatinine: 2.04 mg/dL — ABNORMAL HIGH (ref 0.61–1.24)
GFR, Estimated: 34 mL/min — ABNORMAL LOW
Glucose, Bld: 120 mg/dL — ABNORMAL HIGH (ref 70–99)
Potassium: 4.4 mmol/L (ref 3.5–5.1)
Sodium: 136 mmol/L (ref 135–145)
Total Bilirubin: 0.7 mg/dL (ref 0.0–1.2)
Total Protein: 7.6 g/dL (ref 6.5–8.1)

## 2024-05-24 MED ORDER — SODIUM CHLORIDE 0.9 % IV SOLN: INTRAVENOUS | Status: DC

## 2024-05-24 MED ORDER — SODIUM CHLORIDE 0.9 % IV SOLN
125.0000 mg | Freq: Once | INTRAVENOUS | Status: AC
  Administered 2024-05-24: 125 mg via INTRAVENOUS
  Filled 2024-05-24: qty 12

## 2024-05-24 MED ORDER — PROCHLORPERAZINE MALEATE 10 MG PO TABS
10.0000 mg | ORAL_TABLET | Freq: Once | ORAL | Status: AC
  Administered 2024-05-24: 10 mg via ORAL
  Filled 2024-05-24: qty 1

## 2024-05-24 MED ORDER — CYANOCOBALAMIN 1000 MCG/ML IJ SOLN: 1000.0000 ug | Freq: Once | INTRAMUSCULAR | Status: DC

## 2024-05-24 NOTE — Progress Notes (Signed)
 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 Completed adjuvant radiation 2/24-4/3. He is currently receiving Enfortumab (start 7/10).   Nephrostomy exchange planned 2/4  Met with patient and wife in infusion. Patient reports appetite wax and wanes. It has not been great this week. Ate everything in site last week. He has not been drinking Ensure recently. Patient denies nausea, vomiting, diarrhea, constipation.     Medications: reviewed   Labs: glucose 120, BUN 26, Cr 2.04  Anthropometrics: Wt 220 lb today (on lasix )   1/2 - 220 lb 8 oz  12/4 - 233 lb 6.4 oz    NUTRITION DIAGNOSIS: Unintended wt loss - stable   INTERVENTION:  Pt agreeable to drink Ensure with decreased appetite     MONITORING, EVALUATION, GOAL: wt trends, intake   NEXT VISIT: Friday march 6 during infusion

## 2024-05-24 NOTE — Patient Instructions (Signed)
 [facility] - A DEPT OF [place]  Discharge Instructions: Thank you for choosing [facility] to provide your oncology and hematology care.   If you have a lab appointment with the Cancer Center, please go directly to the Cancer Center and check in at the registration area.   Wear comfortable clothing and clothing appropriate for easy access to any Portacath or PICC line.   We strive to give you quality time with your provider. You may need to reschedule your appointment if you arrive late (15 or more minutes).  Arriving late affects you and other patients whose appointments are after yours.  Also, if you miss three or more appointments without notifying the office, you may be dismissed from the clinic at the providers discretion.      For prescription refill requests, have your pharmacy contact our office and allow 72 hours for refills to be completed.    Today you received the following chemotherapy and/or immunotherapy agents: PADCEV       To help prevent nausea and vomiting after your treatment, we encourage you to take your nausea medication as directed.  BELOW ARE SYMPTOMS THAT SHOULD BE REPORTED IMMEDIATELY: *FEVER GREATER THAN 100.4 F (38 C) OR HIGHER *CHILLS OR SWEATING *NAUSEA AND VOMITING THAT IS NOT CONTROLLED WITH YOUR NAUSEA MEDICATION *UNUSUAL SHORTNESS OF BREATH *UNUSUAL BRUISING OR BLEEDING *URINARY PROBLEMS (pain or burning when urinating, or frequent urination) *BOWEL PROBLEMS (unusual diarrhea, constipation, pain near the anus) TENDERNESS IN MOUTH AND THROAT WITH OR WITHOUT PRESENCE OF ULCERS (sore throat, sores in mouth, or a toothache) UNUSUAL RASH, SWELLING OR PAIN  UNUSUAL VAGINAL DISCHARGE OR ITCHING   Items with * indicate a potential emergency and should be followed up as soon as possible or go to the Emergency Department if any problems should occur.  Please show the CHEMOTHERAPY ALERT CARD or IMMUNOTHERAPY  ALERT CARD at check-in to the Emergency Department and triage nurse.  Should you have questions after your visit or need to cancel or reschedule your appointment, please contact [facility] - A DEPT OF [facility]  Dept: [phone]  and follow the prompts.  Office hours are 8:00 a.m. to 4:30 p.m. Monday - Friday. Please note that voicemails left after 4:00 p.m. may not be returned until the following business day.  We are closed weekends and major holidays. You have access to a nurse at all times for urgent questions. Please call the main number to the clinic Dept: [phone] and follow the prompts.   For any non-urgent questions, you may also contact your provider using MyChart. We now offer e-Visits for anyone 75 and older to request care online for non-urgent symptoms. For details visit [email or web address].   Also download the MyChart app! Go to the app store, search MyChart, open the app, select [facility], and log in with your MyChart username and password.

## 2024-05-28 ENCOUNTER — Other Ambulatory Visit: Payer: Self-pay | Admitting: Radiology

## 2024-05-28 DIAGNOSIS — Z936 Other artificial openings of urinary tract status: Secondary | ICD-10-CM

## 2024-05-28 LAB — LIPID PANEL W/O CHOL/HDL RATIO

## 2024-05-29 ENCOUNTER — Other Ambulatory Visit: Payer: Self-pay

## 2024-05-29 ENCOUNTER — Inpatient Hospital Stay (HOSPITAL_COMMUNITY)
Admission: EM | Admit: 2024-05-29 | Source: Ambulatory Visit | Attending: Internal Medicine | Admitting: Internal Medicine

## 2024-05-29 ENCOUNTER — Encounter (HOSPITAL_COMMUNITY): Payer: Self-pay

## 2024-05-29 ENCOUNTER — Other Ambulatory Visit (HOSPITAL_COMMUNITY): Payer: Self-pay | Admitting: Interventional Radiology

## 2024-05-29 ENCOUNTER — Ambulatory Visit (HOSPITAL_COMMUNITY): Payer: Self-pay | Admitting: Cardiology

## 2024-05-29 ENCOUNTER — Other Ambulatory Visit: Payer: Self-pay | Admitting: Radiology

## 2024-05-29 ENCOUNTER — Ambulatory Visit (HOSPITAL_COMMUNITY)
Admission: RE | Admit: 2024-05-29 | Discharge: 2024-05-29 | Disposition: A | Source: Ambulatory Visit | Attending: Interventional Radiology

## 2024-05-29 VITALS — BP 90/55 | HR 80 | Temp 102.2°F | Resp 22 | Ht 66.0 in | Wt 220.0 lb

## 2024-05-29 DIAGNOSIS — C679 Malignant neoplasm of bladder, unspecified: Secondary | ICD-10-CM | POA: Diagnosis not present

## 2024-05-29 DIAGNOSIS — I2510 Atherosclerotic heart disease of native coronary artery without angina pectoris: Secondary | ICD-10-CM | POA: Insufficient documentation

## 2024-05-29 DIAGNOSIS — R579 Shock, unspecified: Secondary | ICD-10-CM | POA: Diagnosis not present

## 2024-05-29 DIAGNOSIS — I6523 Occlusion and stenosis of bilateral carotid arteries: Secondary | ICD-10-CM | POA: Insufficient documentation

## 2024-05-29 DIAGNOSIS — F419 Anxiety disorder, unspecified: Secondary | ICD-10-CM | POA: Insufficient documentation

## 2024-05-29 DIAGNOSIS — G4730 Sleep apnea, unspecified: Secondary | ICD-10-CM | POA: Insufficient documentation

## 2024-05-29 DIAGNOSIS — G4733 Obstructive sleep apnea (adult) (pediatric): Secondary | ICD-10-CM | POA: Diagnosis not present

## 2024-05-29 DIAGNOSIS — E785 Hyperlipidemia, unspecified: Secondary | ICD-10-CM | POA: Diagnosis not present

## 2024-05-29 DIAGNOSIS — N179 Acute kidney failure, unspecified: Secondary | ICD-10-CM

## 2024-05-29 DIAGNOSIS — Z436 Encounter for attention to other artificial openings of urinary tract: Secondary | ICD-10-CM | POA: Insufficient documentation

## 2024-05-29 DIAGNOSIS — N1832 Chronic kidney disease, stage 3b: Secondary | ICD-10-CM | POA: Diagnosis not present

## 2024-05-29 DIAGNOSIS — D696 Thrombocytopenia, unspecified: Secondary | ICD-10-CM | POA: Diagnosis not present

## 2024-05-29 DIAGNOSIS — D62 Acute posthemorrhagic anemia: Secondary | ICD-10-CM | POA: Diagnosis not present

## 2024-05-29 DIAGNOSIS — D649 Anemia, unspecified: Secondary | ICD-10-CM

## 2024-05-29 DIAGNOSIS — I5032 Chronic diastolic (congestive) heart failure: Secondary | ICD-10-CM | POA: Insufficient documentation

## 2024-05-29 DIAGNOSIS — I252 Old myocardial infarction: Secondary | ICD-10-CM | POA: Insufficient documentation

## 2024-05-29 DIAGNOSIS — R6521 Severe sepsis with septic shock: Secondary | ICD-10-CM | POA: Diagnosis not present

## 2024-05-29 DIAGNOSIS — K219 Gastro-esophageal reflux disease without esophagitis: Secondary | ICD-10-CM

## 2024-05-29 DIAGNOSIS — Z936 Other artificial openings of urinary tract status: Secondary | ICD-10-CM

## 2024-05-29 DIAGNOSIS — I7121 Aneurysm of the ascending aorta, without rupture: Secondary | ICD-10-CM | POA: Insufficient documentation

## 2024-05-29 DIAGNOSIS — F32A Depression, unspecified: Secondary | ICD-10-CM | POA: Insufficient documentation

## 2024-05-29 DIAGNOSIS — I110 Hypertensive heart disease with heart failure: Secondary | ICD-10-CM | POA: Insufficient documentation

## 2024-05-29 DIAGNOSIS — I350 Nonrheumatic aortic (valve) stenosis: Secondary | ICD-10-CM | POA: Insufficient documentation

## 2024-05-29 DIAGNOSIS — Z87891 Personal history of nicotine dependence: Secondary | ICD-10-CM | POA: Insufficient documentation

## 2024-05-29 DIAGNOSIS — A419 Sepsis, unspecified organism: Principal | ICD-10-CM | POA: Diagnosis present

## 2024-05-29 DIAGNOSIS — Z79899 Other long term (current) drug therapy: Secondary | ICD-10-CM | POA: Insufficient documentation

## 2024-05-29 LAB — CBC WITH DIFFERENTIAL/PLATELET
Abs Immature Granulocytes: 0.02 10*3/uL (ref 0.00–0.07)
Abs Immature Granulocytes: 0.2 10*3/uL — ABNORMAL HIGH (ref 0.00–0.07)
Band Neutrophils: 20 %
Basophils Absolute: 0 10*3/uL (ref 0.0–0.1)
Basophils Absolute: 0 10*3/uL (ref 0.0–0.1)
Basophils Relative: 1 %
Basophils Relative: 0 %
Eosinophils Absolute: 0.1 10*3/uL (ref 0.0–0.5)
Eosinophils Absolute: 0.1 10*3/uL (ref 0.0–0.5)
Eosinophils Relative: 2 %
Eosinophils Relative: 1 %
HCT: 23.9 % — ABNORMAL LOW (ref 39.0–52.0)
HCT: 22.2 % — ABNORMAL LOW (ref 39.0–52.0)
Hemoglobin: 7.6 g/dL — ABNORMAL LOW (ref 13.0–17.0)
Hemoglobin: 7 g/dL — ABNORMAL LOW (ref 13.0–17.0)
Immature Granulocytes: 1 %
Lymphocytes Relative: 11 %
Lymphocytes Relative: 2 %
Lymphs Abs: 0.5 10*3/uL — ABNORMAL LOW (ref 0.7–4.0)
Lymphs Abs: 0.2 10*3/uL — ABNORMAL LOW (ref 0.7–4.0)
MCH: 31.1 pg (ref 26.0–34.0)
MCH: 31 pg (ref 26.0–34.0)
MCHC: 31.8 g/dL (ref 30.0–36.0)
MCHC: 31.5 g/dL (ref 30.0–36.0)
MCV: 98 fL (ref 80.0–100.0)
MCV: 98.2 fL (ref 80.0–100.0)
Metamyelocytes Relative: 2 %
Monocytes Absolute: 0.5 10*3/uL (ref 0.1–1.0)
Monocytes Absolute: 0.1 10*3/uL (ref 0.1–1.0)
Monocytes Relative: 12 %
Monocytes Relative: 1 %
Neutro Abs: 3.3 10*3/uL (ref 1.7–7.7)
Neutro Abs: 8 10*3/uL — ABNORMAL HIGH (ref 1.7–7.7)
Neutrophils Relative %: 73 %
Neutrophils Relative %: 74 %
Platelets: 111 10*3/uL — ABNORMAL LOW (ref 150–400)
Platelets: 81 10*3/uL — ABNORMAL LOW (ref 150–400)
RBC: 2.44 MIL/uL — ABNORMAL LOW (ref 4.22–5.81)
RBC: 2.26 MIL/uL — ABNORMAL LOW (ref 4.22–5.81)
RDW: 18.7 % — ABNORMAL HIGH (ref 11.5–15.5)
RDW: 19.2 % — ABNORMAL HIGH (ref 11.5–15.5)
Smear Review: NORMAL
WBC: 4.4 10*3/uL (ref 4.0–10.5)
WBC: 8.5 10*3/uL (ref 4.0–10.5)
nRBC: 0 % (ref 0.0–0.2)
nRBC: 0 % (ref 0.0–0.2)

## 2024-05-29 LAB — COMPREHENSIVE METABOLIC PANEL WITH GFR
ALT: 20 U/L (ref 0–44)
AST: 49 U/L — ABNORMAL HIGH (ref 15–41)
Albumin: 2.6 g/dL — ABNORMAL LOW (ref 3.5–5.0)
Alkaline Phosphatase: 182 U/L — ABNORMAL HIGH (ref 38–126)
Anion gap: 12 (ref 5–15)
BUN: 33 mg/dL — ABNORMAL HIGH (ref 8–23)
CO2: 18 mmol/L — ABNORMAL LOW (ref 22–32)
Calcium: 7.6 mg/dL — ABNORMAL LOW (ref 8.9–10.3)
Chloride: 106 mmol/L (ref 98–111)
Creatinine, Ser: 2.66 mg/dL — ABNORMAL HIGH (ref 0.61–1.24)
GFR, Estimated: 25 mL/min — ABNORMAL LOW
Glucose, Bld: 100 mg/dL — ABNORMAL HIGH (ref 70–99)
Potassium: 3.8 mmol/L (ref 3.5–5.1)
Sodium: 136 mmol/L (ref 135–145)
Total Bilirubin: 0.8 mg/dL (ref 0.0–1.2)
Total Protein: 5.8 g/dL — ABNORMAL LOW (ref 6.5–8.1)

## 2024-05-29 LAB — PREPARE RBC (CROSSMATCH)

## 2024-05-29 LAB — BASIC METABOLIC PANEL WITH GFR
Anion gap: 11 (ref 5–15)
BUN: 31 mg/dL — ABNORMAL HIGH (ref 8–23)
CO2: 21 mmol/L — ABNORMAL LOW (ref 22–32)
Calcium: 8.6 mg/dL — ABNORMAL LOW (ref 8.9–10.3)
Chloride: 104 mmol/L (ref 98–111)
Creatinine, Ser: 2.3 mg/dL — ABNORMAL HIGH (ref 0.61–1.24)
GFR, Estimated: 30 mL/min — ABNORMAL LOW
Glucose, Bld: 116 mg/dL — ABNORMAL HIGH (ref 70–99)
Potassium: 3.9 mmol/L (ref 3.5–5.1)
Sodium: 137 mmol/L (ref 135–145)

## 2024-05-29 LAB — LIPID PANEL W/O CHOL/HDL RATIO
Cholesterol, Total: 126 mg/dL (ref 100–199)
HDL: 45 mg/dL
LDL Chol Calc (NIH): 55 mg/dL (ref 0–99)
Triglycerides: 153 mg/dL — AB (ref 0–149)
VLDL Cholesterol Cal: 26 mg/dL (ref 5–40)

## 2024-05-29 LAB — SPECIMEN STATUS REPORT

## 2024-05-29 LAB — PROTIME-INR
INR: 1.1 (ref 0.8–1.2)
Prothrombin Time: 15.1 s (ref 11.4–15.2)

## 2024-05-29 LAB — PRO B NATRIURETIC PEPTIDE: NT-Pro BNP: 895 pg/mL — AB (ref 0–376)

## 2024-05-29 LAB — MRSA NEXT GEN BY PCR, NASAL: MRSA by PCR Next Gen: NOT DETECTED

## 2024-05-29 MED ORDER — SODIUM CHLORIDE 0.9% IV SOLUTION: Freq: Once | INTRAVENOUS | Status: DC

## 2024-05-29 MED ORDER — HEPARIN SOD (PORK) LOCK FLUSH 100 UNIT/ML IV SOLN: 500.0000 [IU] | Freq: Once | INTRAVENOUS | Status: DC

## 2024-05-29 MED ORDER — SODIUM CHLORIDE 0.9 % IV SOLN: INTRAVENOUS | Status: DC

## 2024-05-29 MED ORDER — SODIUM CHLORIDE 0.9 % IV BOLUS: 500.0000 mL | Freq: Once | INTRAVENOUS | Status: DC

## 2024-05-29 MED ORDER — SODIUM CHLORIDE 0.9 % IV SOLN
2.0000 g | Freq: Once | INTRAVENOUS | Status: AC
  Administered 2024-05-29: 2 g via INTRAVENOUS

## 2024-05-29 MED ORDER — ACETAMINOPHEN 500 MG PO TABS
1000.0000 mg | ORAL_TABLET | ORAL | Status: AC
  Administered 2024-05-29: 1000 mg via ORAL

## 2024-05-29 MED ORDER — SODIUM CHLORIDE 0.9% IV SOLUTION: Freq: Once | INTRAVENOUS | Status: AC

## 2024-05-29 MED ORDER — LIDOCAINE-EPINEPHRINE 1 %-1:100000 IJ SOLN
20.0000 mL | Freq: Once | INTRAMUSCULAR | Status: AC
  Administered 2024-05-29: 10 mL via INTRADERMAL

## 2024-05-29 MED ORDER — CIPROFLOXACIN IN D5W 400 MG/200ML IV SOLN
400.0000 mg | Freq: Once | INTRAVENOUS | Status: AC
  Administered 2024-05-29: 400 mg via INTRAVENOUS
  Filled 2024-05-29: qty 200

## 2024-05-29 MED ORDER — SODIUM CHLORIDE 0.9 % IV SOLN
INTRAVENOUS | Status: AC
  Filled 2024-05-29: qty 20

## 2024-05-29 MED ORDER — ONDANSETRON HCL 4 MG PO TABS: 4.0000 mg | ORAL_TABLET | Freq: Four times a day (QID) | ORAL | Status: AC | PRN

## 2024-05-29 MED ORDER — MEPERIDINE HCL 25 MG/ML IJ SOLN
25.0000 mg | Freq: Once | INTRAMUSCULAR | Status: AC
  Administered 2024-05-29: 25 mg via INTRAVENOUS
  Filled 2024-05-29: qty 1

## 2024-05-29 MED ORDER — FENTANYL CITRATE (PF) 100 MCG/2ML IJ SOLN
INTRAMUSCULAR | Status: AC | PRN
  Administered 2024-05-29: 50 ug via INTRAVENOUS

## 2024-05-29 MED ORDER — MIDAZOLAM HCL (PF) 2 MG/2ML IJ SOLN
INTRAMUSCULAR | Status: AC | PRN
  Administered 2024-05-29: 1 mg via INTRAVENOUS

## 2024-05-29 MED ORDER — ACETAMINOPHEN 500 MG PO TABS: 1000.0000 mg | ORAL_TABLET | Freq: Four times a day (QID) | ORAL | Status: DC | PRN

## 2024-05-29 MED ORDER — ALBUTEROL SULFATE (2.5 MG/3ML) 0.083% IN NEBU: 2.5000 mg | INHALATION_SOLUTION | RESPIRATORY_TRACT | Status: AC | PRN

## 2024-05-29 MED ORDER — IOHEXOL 300 MG/ML  SOLN
100.0000 mL | Freq: Once | INTRAMUSCULAR | Status: AC | PRN
  Administered 2024-05-29: 75 mL via INTRA_ARTERIAL

## 2024-05-29 MED ORDER — DIPHENHYDRAMINE HCL 50 MG/ML IJ SOLN: 50.0000 mg | Freq: Four times a day (QID) | INTRAMUSCULAR | Status: AC | PRN

## 2024-05-29 MED ORDER — LIDOCAINE-EPINEPHRINE 1 %-1:100000 IJ SOLN
INTRAMUSCULAR | Status: AC
  Filled 2024-05-29: qty 20

## 2024-05-29 MED ORDER — FENTANYL CITRATE (PF) 100 MCG/2ML IJ SOLN
INTRAMUSCULAR | Status: AC
  Filled 2024-05-29: qty 2

## 2024-05-29 MED ORDER — ACETAMINOPHEN 500 MG PO TABS
ORAL_TABLET | ORAL | Status: AC
  Filled 2024-05-29: qty 2

## 2024-05-29 MED ORDER — MIDAZOLAM HCL 2 MG/2ML IJ SOLN
INTRAMUSCULAR | Status: AC
  Filled 2024-05-29: qty 2

## 2024-05-29 MED ORDER — BISACODYL 5 MG PO TBEC: 5.0000 mg | DELAYED_RELEASE_TABLET | Freq: Every day | ORAL | Status: AC | PRN

## 2024-05-29 MED ORDER — ONDANSETRON HCL 4 MG/2ML IJ SOLN: 4.0000 mg | Freq: Four times a day (QID) | INTRAMUSCULAR | Status: AC | PRN

## 2024-05-29 MED ORDER — DIPHENHYDRAMINE HCL 50 MG/ML IJ SOLN
25.0000 mg | Freq: Once | INTRAMUSCULAR | Status: AC
  Administered 2024-05-29: 25 mg via INTRAVENOUS
  Filled 2024-05-29: qty 1

## 2024-05-29 MED ORDER — ACETAMINOPHEN 325 MG PO TABS: 650.0000 mg | ORAL_TABLET | Freq: Four times a day (QID) | ORAL | Status: AC | PRN

## 2024-05-29 MED ORDER — INSULIN ASPART 100 UNIT/ML IJ SOLN: 0.0000 [IU] | INTRAMUSCULAR | Status: DC

## 2024-05-29 MED ORDER — NOREPINEPHRINE 4 MG/250ML-% IV SOLN
0.0000 ug/min | INTRAVENOUS | Status: AC
  Administered 2024-05-29: 2 ug/min via INTRAVENOUS
  Administered 2024-05-30: 4 ug/min via INTRAVENOUS
  Administered 2024-05-30: 6 ug/min via INTRAVENOUS
  Filled 2024-05-29 (×3): qty 250

## 2024-05-29 MED ORDER — SODIUM CHLORIDE 0.9% FLUSH
3.0000 mL | Freq: Two times a day (BID) | INTRAVENOUS | Status: AC
  Administered 2024-05-29 – 2024-05-31 (×5): 3 mL via INTRAVENOUS

## 2024-05-29 MED ORDER — ACETAMINOPHEN 650 MG RE SUPP: 650.0000 mg | Freq: Four times a day (QID) | RECTAL | Status: AC | PRN

## 2024-05-29 NOTE — Sedation Documentation (Signed)
 RN Adyan Palau pulled 2mg  Versed  and 100mcg Fentanyl  in IR room pysix.Pt. Received 2mg  Versed  and 100 mcg Fentanyl  throughout the procedure.

## 2024-05-29 NOTE — Procedures (Signed)
 Vascular and Interventional Radiology Procedure Note  Patient: Luis [name] DOB: 02/16/1956 Medical Record Number: [id number] Note Date/Time: 05/29/24 12:30 PM   Performing Physician: Thom Hall, MD Assistant(s): None  Diagnosis: Hx bladder CA s/p cystectomy. B PCNs in place  Procedure:  NEPHROSTOMY to RETROGRADE URETERAL CATHETER CONVERSION BILATERAL ANTEROGRADE NEPHROSTOGRAM  Anesthesia: Conscious Sedation Complications: None Estimated Blood Loss: Minimal Specimens:  None  Findings:  Successful conversion to  8.5 Fr R and 10 Fr L retrograde NU tubes, with ends coiled into the kidney(s).   Plan: Follow up for routine NU tube exchange in 8-10 week(s).   See detailed procedure note with images in PACS. The patient tolerated the procedure well without incident or complication and was returned to Recovery in stable condition.    Thom Hall, MD Vascular and Interventional Radiology [facility]   Pager. [phone] Clinic. [phone]

## 2024-05-29 NOTE — H&P (Signed)
 " History and Physical    Patient: Luis [name] FMW:[id number] DOB: 1955/08/09 DOA: (Not on file) DOS: the patient was seen and examined on 05/29/2024 PCP: Johnny Garnette LABOR, MD  Patient coming from: Home  Chief Complaint: No chief complaint on file.  HPI: Luis [name] is a 69 y.o. male with medical history significant for invasive bladder cancer in chemo, next round due in 2 days who presented for outpatient urology procedure today.  He had urostomy tubes which came out from his back.  They were converted to come out of an anterior stoma that he already had.  This was done through an interventional radiology procedure today.  The patient was in his usual state of health prior to the procedure.  He has been incontinent of stool which is baseline.  After the procedure the patient developed a temperature of 102 and became hypotensive. He was given IV fluids but the patient's blood pressure remained in the 80s so the hospitalist were called to evaluate. He has history of coronary artery disease and PCI, history of sleep apnea on CPAP, prediabetes, restless leg syndrome, hyperlipidemia, and moderate aortic stenosis. The patient was found to have a hemoglobin of 7.6. With continued IV fluids his systolic blood pressure did come up to 95-105. Blood transfusion has been ordered. The patient has a port for access.  A peripheral IV was also placed.  He received IV ciprofloxacin .  His temperature has remained elevated so far despite 1 g of Tylenol . He denies any chest pain or shortness of breath or any pain at all. He will be admitted to a stepdown bed.  Management is ongoing.  The patient's wife is at bedside provides most of the history.  Review of Systems: unable to review all systems due to the inability of the patient to answer questions. Past Medical History:  Diagnosis Date   Anxiety    takes Valium  as needed   Aortic stenosis, moderate    Arthritis    Ascending aortic aneurysm    CAD  (coronary artery disease)    a. s/p PCI of the RCA 8/12 with DES by Dr Wonda, preserved EF. b. LHC/RHC (2/16) with mean RA 12, PA 32/15, mean PCWP 18, CI 3.47; patent mid and distal RCA stents, 50-60% proximal stenosis small PDA.      Cancer [facility])    bladder   Carotid stenosis    a. Carotid US  (05/2013):  Bilateral 1-39% ICA; L thyroid  nodule (prior hx of aspiration).   Chronic diastolic CHF (congestive heart failure) (HCC)    Complication of anesthesia    difficulty waking up after gallbladder surgery   Depression    Dyspnea    Essential hypertension    GERD (gastroesophageal reflux disease)    if needed will take OTC meds    Heart murmur    History of colonic polyps    hyperplastic   Hyperlipidemia    Joint pain    Lesion of bladder    Myocardial infarction (HCC) 2012   Obesity (BMI 30-39.9) 02/29/2016   Pre-diabetes    Restless leg    Sleep apnea    uses cpap   Tubular adenoma of colon    Vertigo    takes Meclizine  as needed   Past Surgical History:  Procedure Laterality Date   AORTIC VALVE REPLACEMENT N/A 09/16/2021   Procedure: AORTIC VALVE REPLACEMENT (AVR);  Surgeon: Lucas Dorise POUR, MD;  Location: [facility] OR;  Service: Open Heart Surgery;  Laterality: N/A;  CATARACT EXTRACTION   4 YRS AGO   BOTH EYES   CHOLECYSTECTOMY  07/21/2011   Procedure: LAPAROSCOPIC CHOLECYSTECTOMY WITH INTRAOPERATIVE CHOLANGIOGRAM;  Surgeon: Alm VEAR Angle, MD;  [facility];  Service: General;  Laterality: N/A;   CORONARY ANGIOPLASTY  2012   2 stents   coronary stenting     s/p PCI of the RCA by Dr Wonda 8/12 with 2 promus stents   CYSTOSCOPY W/ RETROGRADES Bilateral 12/13/2019   Procedure: CYSTOSCOPY WITH RETROGRADE PYELOGRAM;  Surgeon: Alvaro Hummer, MD;  Location: [facility];  Service: Urology;  Laterality: Bilateral;   CYSTOSCOPY W/ RETROGRADES Bilateral 10/14/2020   Procedure: CYSTOSCOPY WITH RETROGRADE PYELOGRAM;  Surgeon: Alvaro Hummer, MD;  [facility];  Service: Urology;  Laterality: Bilateral;   CYSTOSCOPY W/ RETROGRADES Bilateral 12/16/2020   Procedure: CYSTOSCOPY WITH RETROGRADE PYELOGRAM;  Surgeon: Alvaro Hummer, MD;  Location: [facility];  Service: Urology;  Laterality: Bilateral;   CYSTOSCOPY W/ RETROGRADES Bilateral 06/03/2022   Procedure: CYSTOSCOPY WITH RETROGRADE PYELOGRAM;  Surgeon: Alvaro Hummer, MD;  [facility];  Service: Urology;  Laterality: Bilateral;   CYSTOSCOPY W/ RETROGRADES Bilateral 12/28/2022   Procedure: CYSTOSCOPY WITH RETROGRADE PYELOGRAM, FULGARATION OF BLEEDERS;  Surgeon: Alvaro Hummer KATHEE Mickey., MD;  [facility];  Service: Urology;  Laterality: Bilateral;   CYSTOSCOPY WITH INJECTION N/A 03/03/2023   Procedure: CYSTOSCOPY WITH INDOCYANINE INJECTION;  Surgeon: Alvaro Hummer KATHEE Mickey., MD;  [facility];  Service: Urology;  Laterality: N/A;  360 MINUTES   ESOPHAGOGASTRODUODENOSCOPY N/A 06/28/2023   Procedure: EGD (ESOPHAGOGASTRODUODENOSCOPY);  Surgeon: Charlanne Groom, MD;  Location: [facility];  Service: Gastroenterology;  Laterality: N/A;   IR IMAGING GUIDED PORT INSERTION  05/29/2023   IR NEPHROSTOMY EXCHANGE LEFT  08/07/2023   IR NEPHROSTOMY EXCHANGE LEFT  10/02/2023   IR NEPHROSTOMY EXCHANGE LEFT  11/27/2023   IR NEPHROSTOMY EXCHANGE LEFT  01/10/2024   IR NEPHROSTOMY EXCHANGE LEFT  02/21/2024   IR NEPHROSTOMY EXCHANGE LEFT  04/03/2024   IR NEPHROSTOMY PLACEMENT RIGHT  07/01/2023   IR RADIOLOGY PERIPHERAL GUIDED IV START  07/01/2023   IR US  GUIDE VASC ACCESS LEFT  07/01/2023   LEFT AND RIGHT HEART CATHETERIZATION WITH CORONARY ANGIOGRAM N/A 06/23/2014   Procedure: LEFT AND RIGHT HEART CATHETERIZATION WITH CORONARY ANGIOGRAM;  Surgeon: Ezra GORMAN Shuck, MD;  Location: [facility] CATH LAB;  Service: Cardiovascular;  Laterality: N/A;   NECK SURGERY  03/23/09   per Dr. Barbarann, cervical fusion    PERICARDIOCENTESIS N/A 09/28/2021   Procedure: PERICARDIOCENTESIS;  Surgeon: Dann Candyce GORMAN, MD;   Location: [facility] INVASIVE CV LAB;  Service: Cardiovascular;  Laterality: N/A;   REPLACEMENT ASCENDING AORTA N/A 09/16/2021   Procedure: REPLACEMENT ASCENDING AORTA WITH 30 X HEMASHIELD PLATINUM WOVEN DOUBLE VELOUR VASCULAR GRAFT;  Surgeon: Lucas Dorise POUR, MD;  Location: [facility];  Service: Open Heart Surgery;  Laterality: N/A;  CIRC ARREST   right elbow surgery     RIGHT HEART CATH N/A 06/15/2023   Procedure: RIGHT HEART CATH;  Surgeon: Shuck Ezra GORMAN, MD;  Location: [facility] INVASIVE CV LAB;  Service: Cardiovascular;  Laterality: N/A;   RIGHT HEART CATH AND CORONARY ANGIOGRAPHY N/A 07/08/2021   Procedure: RIGHT HEART CATH AND CORONARY ANGIOGRAPHY;  Surgeon: Shuck Ezra GORMAN, MD;  Location: [facility] INVASIVE CV LAB;  Service: Cardiovascular;  Laterality: N/A;   ROBOT ASSISTED LAPAROSCOPIC COMPLETE CYSTECT ILEAL CONDUIT N/A 03/03/2023   Procedure: XI ROBOTIC ASSISTED LAPAROSCOPIC COMPLETE CYSTECTECTOMY WITH  ILEAL CONDUIT DIVERSION;  Surgeon: Alvaro Ricardo KATHEE Mickey., MD;  [facility];  Service: Urology;  Laterality: N/A;   ROBOT ASSISTED LAPAROSCOPIC RADICAL PROSTATECTOMY N/A 03/03/2023   Procedure: XI ROBOTIC ASSISTED LAPAROSCOPIC RADICAL PROSTATECTOMY WITH LYMPH NODE DISSECTION;  Surgeon: Alvaro Ricardo KATHEE Mickey., MD;  [facility];  Service: Urology;  Laterality: N/A;   solonscopy  05/23/08   per Dr. Jakie inch hemorrhoids only, repeat in 5 years   SUBXYPHOID PERICARDIAL WINDOW N/A 09/28/2021   Procedure: SUBXYPHOID PERICARDIAL WINDOW;  Surgeon: Lucas Dorise POUR, MD;  Location: [facility];  Service: Thoracic;  Laterality: N/A;   TEE WITHOUT CARDIOVERSION N/A 06/23/2014   Procedure: TRANSESOPHAGEAL ECHOCARDIOGRAM (TEE);  Surgeon: Ezra GORMAN Shuck, MD;  Location: [facility] ENDOSCOPY;  Service: Cardiovascular;  Laterality: N/A;   TEE WITHOUT CARDIOVERSION N/A 01/21/2016   Procedure: TRANSESOPHAGEAL ECHOCARDIOGRAM (TEE);  Surgeon: Ezra GORMAN Shuck, MD;  Location: [facility] ENDOSCOPY;  Service: Cardiovascular;  Laterality: N/A;    TEE WITHOUT CARDIOVERSION N/A 09/16/2021   Procedure: TRANSESOPHAGEAL ECHOCARDIOGRAM (TEE);  Surgeon: Lucas Dorise POUR, MD;  Location: [facility] OR;  Service: Open Heart Surgery;  Laterality: N/A;   TEE WITHOUT CARDIOVERSION N/A 09/28/2021   Procedure: TRANSESOPHAGEAL ECHOCARDIOGRAM (TEE);  Surgeon: Lucas Dorise POUR, MD;  Location: [facility] OR;  Service: Thoracic;  Laterality: N/A;   TONSILLECTOMY     TRANSESOPHAGEAL ECHOCARDIOGRAM (CATH LAB) N/A 04/17/2023   Procedure: TRANSESOPHAGEAL ECHOCARDIOGRAM;  Surgeon: Jeffrie Oneil BROCKS, MD;  Location: [facility] INVASIVE CV LAB;  Service: Cardiovascular;  Laterality: N/A;   TRANSURETHRAL RESECTION OF BLADDER TUMOR N/A 10/21/2019   Procedure: TRANSURETHRAL RESECTION OF BLADDER TUMOR (TURBT);  Surgeon: Ottelin, Mark, MD;  Location: [facility];  Service: Urology;  Laterality: N/A;   TRANSURETHRAL RESECTION OF BLADDER TUMOR N/A 12/13/2019   Procedure: TRANSURETHRAL RESECTION OF BLADDER TUMOR (TURBT);  Surgeon: Alvaro Ricardo, MD;  Location: [facility];  Service: Urology;  Laterality: N/A;  1 HR   TRANSURETHRAL RESECTION OF BLADDER TUMOR N/A 10/14/2020   Procedure: TRANSURETHRAL RESECTION OF BLADDER TUMOR (TURBT);  Surgeon: Alvaro Ricardo, MD;  Location: [facility];  Service: Urology;  Laterality: N/A;   TRANSURETHRAL RESECTION OF BLADDER TUMOR N/A 12/16/2020   Procedure: RESTAGING TRANSURETHRAL RESECTION OF BLADDER TUMOR (TURBT);  Surgeon: Alvaro Ricardo, MD;  Location: [facility];  Service: Urology;  Laterality: N/A;   TRANSURETHRAL RESECTION OF BLADDER TUMOR N/A 06/03/2022   Procedure: TRANSURETHRAL RESECTION OF BLADDER TUMOR (TURBT);  Surgeon: Alvaro Ricardo, MD;  [facility];  Service: Urology;  Laterality: N/A;   UMBILICAL HERNIA REPAIR  03/03/2023   Procedure: HERNIA REPAIR UMBILICAL;  Surgeon: Alvaro Ricardo KATHEE Mickey., MD;  [facility];  Service: Urology;;   Social History:  reports that he quit smoking about 14  years ago. His smoking use included cigarettes. He started smoking about 54 years ago. He has a 80 pack-year smoking history. He has never been exposed to tobacco smoke. He has never used smokeless tobacco. He reports that he does not drink alcohol and does not use drugs.  Allergies[1]  Family History  Problem Relation Age of Onset   Lung cancer Mother        lung   Esophageal cancer Cousin    Colon cancer Neg Hx    Rectal cancer Neg Hx    Stomach cancer Neg Hx     Prior to Admission medications  Medication Sig Start Date End Date Taking? Authorizing Provider  acetaminophen  (TYLENOL ) 325 MG tablet Take 650 mg by mouth every  6 (six) hours as needed (for pain).    [provider]  amLODipine  (NORVASC ) 5 MG tablet TAKE 1 TABLET (5 MG TOTAL) BY MOUTH DAILY. 04/22/24   Neomi Johnston DASEN, PA-C  aspirin  EC 81 MG tablet Take 81 mg by mouth in the morning.    [provider]  carvedilol  (COREG ) 3.125 MG tablet TAKE 1 TABLET BY MOUTH TWICE A DAY WITH FOOD 02/26/24   Rolan Ezra RAMAN, MD  cyanocobalamin  (VITAMIN B12) 1000 MCG tablet Take 1 tablet (1,000 mcg total) by mouth daily. 06/15/23   Dorsey, John T IV, MD  diazepam  (VALIUM ) 5 MG tablet TAKE 1 TABLET BY MOUTH EVERY 12 HOURS AS NEEDED FOR ANXIETY. 03/29/23   Johnny Garnette LABOR, MD  famotidine  (PEPCID ) 20 MG tablet Take 1 tablet (20 mg total) by mouth 2 (two) times daily for 6 days. 08/15/23 09/14/24  Walisiewicz, Kaitlyn E, PA-C  ferrous sulfate  325 (65 FE) MG tablet Take 1 tablet (325 mg total) by mouth daily with breakfast. Please take with a source of Vitamin C 09/23/23   Dorsey, John T IV, MD  furosemide  (LASIX ) 20 MG tablet TAKE 3 TABLETS BY MOUTH EVERY MORNING AND TAKE 2 TABLETS BY MOUTH EVERY EVENING 01/12/24   McLean, Dalton S, MD  ipratropium (ATROVENT ) 0.03 % nasal spray Place 2 sprays into both nostrils every 12 (twelve) hours as needed for rhinitis.    [provider]  lidocaine -prilocaine  (EMLA ) cream Apply topically  once a week. 11/29/23   Federico Norleen DASEN MADISON, MD  Magnesium  500 MG TABS Take 500 mg by mouth every morning.    [provider]  meclizine  (ANTIVERT ) 25 MG tablet Take 1 tablet (25 mg total) by mouth 3 (three) times daily as needed for dizziness. 09/09/22   Johnny Garnette LABOR, MD  mirtazapine  (REMERON ) 7.5 MG tablet TAKE 1 TABLET BY MOUTH AT BEDTIME. 02/16/24   Boscia, Heather E, NP  multivitamin-iron-minerals-folic acid  (CENTRUM) chewable tablet Chew 1 tablet by mouth daily. 09/23/21   Barrett, Erin R, PA-C  nitroGLYCERIN  (NITROSTAT ) 0.4 MG SL tablet Place 0.4 mg under the tongue every 5 (five) minutes as needed for chest pain. 11/03/17 12/05/25  [provider]  ondansetron  (ZOFRAN ) 8 MG tablet Take 1 tablet (8 mg total) by mouth every 8 (eight) hours as needed. 05/16/23   Dorsey, John T IV, MD  pantoprazole  (PROTONIX ) 40 MG tablet TAKE 1 TABLET (40 MG TOTAL) BY MOUTH TWICE A DAY BEFORE MEALS 02/12/24   Johnny Garnette LABOR, MD  potassium chloride  SA (KLOR-CON  M) 20 MEQ tablet Take 1 tablet (20 mEq total) by mouth 2 (two) times daily. 05/10/24   Milford, Harlene HERO, FNP  prochlorperazine  (COMPAZINE ) 10 MG tablet Take 1 tablet (10 mg total) by mouth every 6 (six) hours as needed for nausea or vomiting. 05/16/23   Federico Norleen DASEN MADISON, MD  rosuvastatin  (CRESTOR ) 20 MG tablet Take 1 tablet (20 mg total) by mouth daily. 08/15/23   Johnny Garnette LABOR, MD  temazepam  (RESTORIL ) 30 MG capsule Take 1 capsule (30 mg total) by mouth at bedtime as needed for sleep. 12/06/23   Johnny Garnette LABOR, MD  traMADol  (ULTRAM ) 50 MG tablet Take 1 tablet (50 mg total) by mouth every 6 (six) hours as needed. 08/09/23   Thayil, Irene T, PA-C  venlafaxine  XR (EFFEXOR -XR) 150 MG 24 hr capsule Take 1 capsule (150 mg total) by mouth daily with breakfast. 12/06/23   Johnny Garnette LABOR, MD    Physical Exam: Vitals:  05/29/24 1845  Temp: (!) 102.4 F (39.1 C)  92/57, 97  Physical Exam:  General: pale, ill appearing,  HEENT: Normocephalic,  atraumatic, PERRLA, oral mucosa is dry Cardiovascular: Normal rate and rhythm. Distal pulses intact. SM Pulmonary: Normal pulmonary effort, normal breath sounds Gastrointestinal: Nondistended abdomen, soft, non-tender, normoactive bowel sounds, bright red bloody urine in anterior stoma bag Musculoskeletal:Normal ROM, no lower ext edema Lymphadenopathy: No cervical LAD. Skin: Skin is warm and dry. Neuro: Strength testing deferred, AAOx3. PSYCH: cooperative  Data Reviewed:  Results for orders placed or performed during the hospital encounter of 05/29/24 (from the past 24 hours)  CBC with Differential/Platelet     Status: Abnormal   Collection Time: 05/29/24 12:30 PM  Result Value Ref Range   WBC 4.4 4.0 - 10.5 K/uL   RBC 2.44 (L) 4.22 - 5.81 MIL/uL   Hemoglobin 7.6 (L) 13.0 - 17.0 g/dL   HCT 76.0 (L) 60.9 - 47.9 %   MCV 98.0 80.0 - 100.0 fL   MCH 31.1 26.0 - 34.0 pg   MCHC 31.8 30.0 - 36.0 g/dL   RDW 81.2 (H) 88.4 - 84.4 %   Platelets 111 (L) 150 - 400 K/uL   nRBC 0.0 0.0 - 0.2 %   Neutrophils Relative % 73 %   Neutro Abs 3.3 1.7 - 7.7 K/uL   Lymphocytes Relative 11 %   Lymphs Abs 0.5 (L) 0.7 - 4.0 K/uL   Monocytes Relative 12 %   Monocytes Absolute 0.5 0.1 - 1.0 K/uL   Eosinophils Relative 2 %   Eosinophils Absolute 0.1 0.0 - 0.5 K/uL   Basophils Relative 1 %   Basophils Absolute 0.0 0.0 - 0.1 K/uL   Immature Granulocytes 1 %   Abs Immature Granulocytes 0.02 0.00 - 0.07 K/uL  Protime-INR     Status: None   Collection Time: 05/29/24 12:30 PM  Result Value Ref Range   Prothrombin Time 15.1 11.4 - 15.2 seconds   INR 1.1 0.8 - 1.2  Basic metabolic panel     Status: Abnormal   Collection Time: 05/29/24 12:30 PM  Result Value Ref Range   Sodium 137 135 - 145 mmol/L   Potassium 3.9 3.5 - 5.1 mmol/L   Chloride 104 98 - 111 mmol/L   CO2 21 (L) 22 - 32 mmol/L   Glucose, Bld 116 (H) 70 - 99 mg/dL   BUN 31 (H) 8 - 23 mg/dL   Creatinine, Ser 7.69 (H) 0.61 - 1.24 mg/dL   Calcium   8.6 (L) 8.9 - 10.3 mg/dL   GFR, Estimated 30 (L) >60 mL/min   Anion gap 11 5 - 15   *Note: Due to a large number of results and/or encounters for the requested time period, some results have not been displayed. A complete set of results can be found in Results Review.     Assessment and Plan: Sepsis post procedure 2. Hypotension 3. Acute blood loss anemia 4. Gross hemeturia post urology procedure 5. Moderate Aortic Stenosis - Transfuse 1 unit PRBCs to start - IVF, Pressors if necessary - IV Cipro  - Admit to stepdown - PCCM consult - Wife says the patient had an echocardiogram scheduled for tomorrow as an outpatient.  Will order the Echo.    Advance Care Planning:   Code Status: Full Code  The patient's wife provides all the history.  She says she is his healthcare power of attorney.  They have completed their advance directives.  She says the patient wants to the full  code which is a change from his previous wishes.  Consults: PCCM  Family Communication: Wife at bedside  Severity of Illness: The appropriate patient status for this patient is INPATIENT. Inpatient status is judged to be reasonable and necessary in order to provide the required intensity of service to ensure the patient's safety. The patient's presenting symptoms, physical exam findings, and initial radiographic and laboratory data in the context of their chronic comorbidities is felt to place them at high risk for further clinical deterioration. Furthermore, it is not anticipated that the patient will be medically stable for discharge from the hospital within 2 midnights of admission.   Greater than 70 minutes was spent with patient at bedside in direct patient care and and coordination of care.  * I certify that at the point of admission it is my clinical judgment that the patient will require inpatient hospital care spanning beyond 2 midnights from the point of admission due to high intensity of service, high risk  for further deterioration and high frequency of surveillance required.*  Author: ARTHEA CHILD, MD 05/29/2024 7:01 PM  For on call review [email or web address].      [1] No Known Allergies  "

## 2024-05-29 NOTE — Discharge Instructions (Addendum)
Discharge Instructions:   Please call Interventional Radiology clinic [phone] with any questions or concerns.  You may remove your dressing and shower tomorrow.      Moderate Conscious Sedation, Adult, Care After This sheet gives you information about how to care for yourself after your procedure. Your health care provider may also give you more specific instructions. If you have problems or questions, contact your health care provider. What can I expect after the procedure? After the procedure, it is common to have: Sleepiness for several hours. Impaired judgment for several hours. Difficulty with balance. Vomiting if you eat too soon. Follow these instructions at home: For the time period you were told by your health care provider:  Rest. Do not participate in activities where you could fall or become injured. Do not drive or use machinery. Do not drink alcohol. Do not take sleeping pills or medicines that cause drowsiness. Do not make important decisions or sign legal documents. Do not take care of children on your own. Eating and drinking  Follow the diet recommended by your health care provider. Drink enough fluid to keep your urine pale yellow. If you vomit: Drink water, juice, or soup when you can drink without vomiting. Make sure you have little or no nausea before eating solid foods. General instructions Take over-the-counter and prescription medicines only as told by your health care provider. Have a responsible adult stay with you for the time you are told. It is important to have someone help care for you until you are awake and alert. Do not smoke. Keep all follow-up visits as told by your health care provider. This is important. Contact a health care provider if: You are still sleepy or having trouble with balance after 24 hours. You feel light-headed. You keep feeling nauseous or you keep vomiting. You develop a rash. You have a fever. You have redness  or swelling around the IV site. Get help right away if: You have trouble breathing. You have new-onset confusion at home. Summary After the procedure, it is common to feel sleepy, have impaired judgment, or feel nauseous if you eat too soon. Rest after you get home. Know the things you should not do after the procedure. Follow the diet recommended by your health care provider and drink enough fluid to keep your urine pale yellow. Get help right away if you have trouble breathing or new-onset confusion at home. This information is not intended to replace advice given to you by your health care provider. Make sure you discuss any questions you have with your health care provider. Document Revised: 08/09/2019 Document Reviewed: 03/07/2019 Elsevier Patient Education  2023 Elsevier Inc.  

## 2024-05-29 NOTE — Progress Notes (Signed)
 Patient ID: Luis [name], male   DOB: Sep 26, 1955, 69 y.o.   MRN: [id number] Patient status post conversion of bilateral percutaneous nephrostomies to nephroureteral catheters earlier today.  Patient began having rigors and fever postprocedure.  Denies worsening abd/back pain,N/V. Urostomy with bloody urine. Latest temp 102.2. BP also soft. O2 sats 96% 4 liters.  IV fluid boluses given along with Demerol , Tylenol  and IV Cipro .  Case discussed with Dr. Hughes and will contact [facility] for admission. Dr. Arthea returned call and will evaluate pt. New CBC/CMP pending. Pt/spouse updated with plans.

## 2024-05-29 NOTE — Progress Notes (Addendum)
 Received report from MARLA Hoit RN at [facility]. Reported off the B Gagliardi RN for change of shift.  Due to different encounters, orders for type and screen and blood administration were no crossing over. Orders were re-entered. Spoke with phlebotomy- they will draw T/S with printed requisition. Spoke with blood bank- able to see prepare and transfuse orders.

## 2024-05-29 NOTE — Progress Notes (Addendum)
 Report given to Luis [name]. Patient to be transferred to room 1225.

## 2024-05-29 NOTE — Progress Notes (Signed)
 Wife informed this RN that patient receives medication prior to blood transfusions because of a history of allergic reaction. ICU primary RN notified at the bedside.

## 2024-05-29 NOTE — Consult Note (Signed)
 "  NAME:  Luis [name], MRN:  [id number], DOB:  1955-09-01, LOS: 0 ADMISSION DATE:  (Not on file), CONSULTATION DATE:  05/29/24 REFERRING MD:  [facility], CHIEF COMPLAINT:  relative hypotension/sepsis post procedural   History of Present Illness:  69 yo male presented for outpt procedure of b/l nephrostomy tubes conversion to nephroureteral catheters (already urostomy in place). Pt is undergoing chemo for invasive bladder cancer with next round in 2 two days. Post procedure pt developed fever to 102 and was found to be hypotensive, responsive to fluids. He was also found to have hemoglobin of 7.6 from baseline of 8.5. Prior to procedure pt was in his normal state of health. He denies any complaints at this time states he is feeling better post operatively now than he was. Immediately post op, c/o dizziness and chills only.   Ccm was consulted with pt's relative hypotension and potential for declining post procedurally, possibly requiring pressors.   Pertinent  Medical History  Mod Ao stenosis hypertension Gerd Hyperlipidemia Vertigo anxiety depression Bladder cancer, s/p cystectomy  B/l nephrostomy Ckd3b Aocd Chronic thrombocytopenia prediabetes  Significant Hospital Events: Including procedures, antibiotic start and stop dates in addition to other pertinent events   Outpt procedure 2/4 with subsequent sepsis Admitted to progressive 2/4 Ccm consult 2/4  Interim History / Subjective:    Objective    Blood pressure (!) 110/58, pulse 84, temperature (!) 102.4 F (39.1 C), resp. rate (!) 23, SpO2 98%.       No intake or output data in the 24 hours ending 05/29/24 1939 There were no vitals filed for this visit.  Examination: General: nad, laying supine in bed in nad HENT: ncat, eomi, perrla, mmmp Lungs: ctab Cardiovascular: rrr Abdomen: soft, mildly tender, ostomy noted with hematuria in bag, non distended bs + Extremities: no c/c/e Neuro: non focal, aaox4 GU:  deferred  Resolved problem list   Assessment and Plan  Sepsis 2/2 urologic source Acute blood loss anemia on aocd Hematuria Acute on chronic thrombocytopenia Acute hypotension Aki on ckd 3b Elevated alk phos Mod Ao stenosis H/o hypertension Gerd Hyperlipidemia Vertigo H/o anxiety H/o depression Bladder cancer, s/p cystectomy  B/l nephrostomy Prediabetes -empiric abx ongoing -cultures pending, de-escalate as able -transfusing 1uprbc per primary -agree with volume resuscitation -closely monitor for need for vasopressors -pt has port in place which can be accessed for vasopressors should they become indicated -hold anti-htn agents.  -monitor renal indices and uop -repeat cbc in am -check ggt. No bony mets noted on pet scan 7/25 -chronic issues per primary     Labs   CBC: Recent Labs  Lab 05/24/24 1347 05/29/24 1230  WBC 3.8* 4.4  NEUTROABS 2.7 3.3  HGB 8.3* 7.6*  HCT 25.1* 23.9*  MCV 93.7 98.0  PLT 103* 111*    Basic Metabolic Panel: Recent Labs  Lab 05/24/24 1347 05/29/24 1230  NA 136 137  K 4.4 3.9  CL 104 104  CO2 22 21*  GLUCOSE 120* 116*  BUN 26* 31*  CREATININE 2.04* 2.30*  CALCIUM  8.7* 8.6*   GFR: Estimated Creatinine Clearance: 33.1 mL/min (A) (by C-G formula based on SCr of 2.3 mg/dL (H)). Recent Labs  Lab 05/24/24 1347 05/29/24 1230  WBC 3.8* 4.4    Liver Function Tests: Recent Labs  Lab 05/24/24 1347  AST 27  ALT 13  ALKPHOS 147*  BILITOT 0.7  PROT 7.6  ALBUMIN  3.6   No results for input(s): LIPASE, AMYLASE in the last 168 hours. No results  for input(s): AMMONIA in the last 168 hours.  ABG    Component Value Date/Time   PHART 7.323 (L) 03/03/2023 1144   PCO2ART 43.9 03/03/2023 1144   PO2ART 143 (H) 03/03/2023 1144   HCO3 24.6 06/15/2023 0929   HCO3 24.2 06/15/2023 0929   TCO2 21 (L) 06/26/2023 2054   ACIDBASEDEF 1.0 06/15/2023 0929   ACIDBASEDEF 1.0 06/15/2023 0929   O2SAT 57 06/15/2023 0929   O2SAT 61  06/15/2023 0929     Coagulation Profile: Recent Labs  Lab 05/29/24 1230  INR 1.1    Cardiac Enzymes: No results for input(s): CKTOTAL, CKMB, CKMBINDEX, TROPONINI in the last 168 hours.  HbA1C: Hgb A1c MFr Bld  Date/Time Value Ref Range Status  05/30/2022 12:52 PM 5.9 (H) 4.8 - 5.6 % Final    Comment:    (NOTE) Pre diabetes:          5.7%-6.4%  Diabetes:              >6.4%  Glycemic control for   <7.0% adults with diabetes   09/14/2021 11:33 AM 5.9 (H) 4.8 - 5.6 % Final    Comment:    (NOTE) Pre diabetes:          5.7%-6.4%  Diabetes:              >6.4%  Glycemic control for   <7.0% adults with diabetes     CBG: No results for input(s): GLUCAP in the last 168 hours.  Review of Systems:   As per HPI  Past Medical History:  He,  has a past medical history of Anxiety, Aortic stenosis, moderate, Arthritis, Ascending aortic aneurysm, CAD (coronary artery disease), Cancer (HCC), Carotid stenosis, Chronic diastolic CHF (congestive heart failure) (HCC), Complication of anesthesia, Depression, Dyspnea, Essential hypertension, GERD (gastroesophageal reflux disease), Heart murmur, History of colonic polyps, Hyperlipidemia, Joint pain, Lesion of bladder, Myocardial infarction (HCC) (2012), Obesity (BMI 30-39.9) (02/29/2016), Pre-diabetes, Restless leg, Sleep apnea, Tubular adenoma of colon, and Vertigo.   Surgical History:   Past Surgical History:  Procedure Laterality Date   AORTIC VALVE REPLACEMENT N/A 09/16/2021   Procedure: AORTIC VALVE REPLACEMENT (AVR);  Surgeon: Lucas Dorise POUR, MD;  Location[facility] OR;  Service: Open Heart Surgery;  Laterality: N/A;   CATARACT EXTRACTION   4 YRS AGO   BOTH EYES   CHOLECYSTECTOMY  07/21/2011   Procedure: LAPAROSCOPIC CHOLECYSTECTOMY WITH INTRAOPERATIVE CHOLANGIOGRAM;  Surgeon: Alm VEAR Angle, MD;  Location: [facility]S;  Service: General;  Laterality: N/A;   CORONARY ANGIOPLASTY  2012   2 stents   coronary stenting     s/p PCI of  the RCA by Dr Wonda 8/12 with 2 promus stents   CYSTOSCOPY W/ RETROGRADES Bilateral 12/13/2019   Procedure: CYSTOSCOPY WITH RETROGRADE PYELOGRAM;  Surgeon: Alvaro Hummer, MD;  Location: [facility];  Service: Urology;  Laterality: Bilateral;   CYSTOSCOPY W/ RETROGRADES Bilateral 10/14/2020   Procedure: CYSTOSCOPY WITH RETROGRADE PYELOGRAM;  Surgeon: Alvaro Hummer, MD;  Location: [facility];  Service: Urology;  Laterality: Bilateral;   CYSTOSCOPY W/ RETROGRADES Bilateral 12/16/2020   Procedure: CYSTOSCOPY WITH RETROGRADE PYELOGRAM;  Surgeon: Alvaro Hummer, MD;  Location: [facility];  Service: Urology;  Laterality: Bilateral;   CYSTOSCOPY W/ RETROGRADES Bilateral 06/03/2022   Procedure: CYSTOSCOPY WITH RETROGRADE PYELOGRAM;  Surgeon: Alvaro Hummer, MD;  Location: [facility];  Service: Urology;  Laterality: Bilateral;   CYSTOSCOPY W/ RETROGRADES Bilateral 12/28/2022   Procedure: CYSTOSCOPY WITH RETROGRADE PYELOGRAM, FULGARATION OF BLEEDERS;  Surgeon: Alvaro Ricardo KATHEE Mickey., MD;  Location: [facility];  Service: Urology;  Laterality: Bilateral;   CYSTOSCOPY WITH INJECTION N/A 03/03/2023   Procedure: CYSTOSCOPY WITH INDOCYANINE INJECTION;  Surgeon: Alvaro Ricardo KATHEE Mickey., MD;  Location: [facility];  Service: Urology;  Laterality: N/A;  360 MINUTES   ESOPHAGOGASTRODUODENOSCOPY N/A 06/28/2023   Procedure: EGD (ESOPHAGOGASTRODUODENOSCOPY);  Surgeon: Charlanne Groom, MD;  Location: [facility];  Service: Gastroenterology;  Laterality: N/A;   IR IMAGING GUIDED PORT INSERTION  05/29/2023   IR NEPHROSTOMY EXCHANGE LEFT  08/07/2023   IR NEPHROSTOMY EXCHANGE LEFT  10/02/2023   IR NEPHROSTOMY EXCHANGE LEFT  11/27/2023   IR NEPHROSTOMY EXCHANGE LEFT  01/10/2024   IR NEPHROSTOMY EXCHANGE LEFT  02/21/2024   IR NEPHROSTOMY EXCHANGE LEFT  04/03/2024   IR NEPHROSTOMY PLACEMENT RIGHT  07/01/2023   IR RADIOLOGY PERIPHERAL GUIDED IV START  07/01/2023   IR US  GUIDE VASC ACCESS LEFT  07/01/2023   LEFT  AND RIGHT HEART CATHETERIZATION WITH CORONARY ANGIOGRAM N/A 06/23/2014   Procedure: LEFT AND RIGHT HEART CATHETERIZATION WITH CORONARY ANGIOGRAM;  Surgeon: Ezra GORMAN Shuck, MD;  Location: [facility] CATH LAB;  Service: Cardiovascular;  Laterality: N/A;   NECK SURGERY  03/23/09   per Dr. Barbarann, cervical fusion    PERICARDIOCENTESIS N/A 09/28/2021   Procedure: PERICARDIOCENTESIS;  Surgeon: Dann Candyce GORMAN, MD;  Location: [facility] INVASIVE CV LAB;  Service: Cardiovascular;  Laterality: N/A;   REPLACEMENT ASCENDING AORTA N/A 09/16/2021   Procedure: REPLACEMENT ASCENDING AORTA WITH 30 X HEMASHIELD PLATINUM WOVEN DOUBLE VELOUR VASCULAR GRAFT;  Surgeon: Lucas Dorise POUR, MD;  Location: [facility] OR;  Service: Open Heart Surgery;  Laterality: N/A;  CIRC ARREST   right elbow surgery     RIGHT HEART CATH N/A 06/15/2023   Procedure: RIGHT HEART CATH;  Surgeon: Shuck Ezra GORMAN, MD;  Location: [facility] INVASIVE CV LAB;  Service: Cardiovascular;  Laterality: N/A;   RIGHT HEART CATH AND CORONARY ANGIOGRAPHY N/A 07/08/2021   Procedure: RIGHT HEART CATH AND CORONARY ANGIOGRAPHY;  Surgeon: Shuck Ezra GORMAN, MD;  Location: [facility] INVASIVE CV LAB;  Service: Cardiovascular;  Laterality: N/A;   ROBOT ASSISTED LAPAROSCOPIC COMPLETE CYSTECT ILEAL CONDUIT N/A 03/03/2023   Procedure: XI ROBOTIC ASSISTED LAPAROSCOPIC COMPLETE CYSTECTECTOMY WITH  ILEAL CONDUIT DIVERSION;  Surgeon: Alvaro Ricardo KATHEE Mickey., MD;  Location: [facility];  Service: Urology;  Laterality: N/A;   ROBOT ASSISTED LAPAROSCOPIC RADICAL PROSTATECTOMY N/A 03/03/2023   Procedure: XI ROBOTIC ASSISTED LAPAROSCOPIC RADICAL PROSTATECTOMY WITH LYMPH NODE DISSECTION;  Surgeon: Alvaro Ricardo KATHEE Mickey., MD;  Location: [facility];  Service: Urology;  Laterality: N/A;   solonscopy  05/23/08   per Dr. Jakie inch hemorrhoids only, repeat in 5 years   SUBXYPHOID PERICARDIAL WINDOW N/A 09/28/2021   Procedure: SUBXYPHOID PERICARDIAL WINDOW;  Surgeon: Lucas Dorise POUR, MD;  Location: [facility] OR;  Service:  Thoracic;  Laterality: N/A;   TEE WITHOUT CARDIOVERSION N/A 06/23/2014   Procedure: TRANSESOPHAGEAL ECHOCARDIOGRAM (TEE);  Surgeon: Ezra GORMAN Shuck, MD;  Location: [facility] ENDOSCOPY;  Service: Cardiovascular;  Laterality: N/A;   TEE WITHOUT CARDIOVERSION N/A 01/21/2016   Procedure: TRANSESOPHAGEAL ECHOCARDIOGRAM (TEE);  Surgeon: Ezra GORMAN Shuck, MD;  Location: [facility] ENDOSCOPY;  Service: Cardiovascular;  Laterality: N/A;   TEE WITHOUT CARDIOVERSION N/A 09/16/2021   Procedure: TRANSESOPHAGEAL ECHOCARDIOGRAM (TEE);  Surgeon: Lucas Dorise POUR, MD;  Location: [facility] OR;  Service: Open Heart Surgery;  Laterality: N/A;   TEE WITHOUT CARDIOVERSION N/A 09/28/2021   Procedure: TRANSESOPHAGEAL ECHOCARDIOGRAM (TEE);  Surgeon: Lucas Dorise POUR, MD;  Location: [facility]  OR;  Service: Thoracic;  Laterality: N/A;   TONSILLECTOMY     TRANSESOPHAGEAL ECHOCARDIOGRAM (CATH LAB) N/A 04/17/2023   Procedure: TRANSESOPHAGEAL ECHOCARDIOGRAM;  Surgeon: Jeffrie Oneil BROCKS, MD;  Location: [facility] INVASIVE CV LAB;  Service: Cardiovascular;  Laterality: N/A;   TRANSURETHRAL RESECTION OF BLADDER TUMOR N/A 10/21/2019   Procedure: TRANSURETHRAL RESECTION OF BLADDER TUMOR (TURBT);  Surgeon: Ottelin, Mark, MD;  Location: [facility];  Service: Urology;  Laterality: N/A;   TRANSURETHRAL RESECTION OF BLADDER TUMOR N/A 12/13/2019   Procedure: TRANSURETHRAL RESECTION OF BLADDER TUMOR (TURBT);  Surgeon: Alvaro Hummer, MD;  Location: [facility];  Service: Urology;  Laterality: N/A;  1 HR   TRANSURETHRAL RESECTION OF BLADDER TUMOR N/A 10/14/2020   Procedure: TRANSURETHRAL RESECTION OF BLADDER TUMOR (TURBT);  Surgeon: Alvaro Hummer, MD;  Location: [facility];  Service: Urology;  Laterality: N/A;   TRANSURETHRAL RESECTION OF BLADDER TUMOR N/A 12/16/2020   Procedure: RESTAGING TRANSURETHRAL RESECTION OF BLADDER TUMOR (TURBT);  Surgeon: Alvaro Hummer, MD;  Location[facility];  Service: Urology;  Laterality:  N/A;   TRANSURETHRAL RESECTION OF BLADDER TUMOR N/A 06/03/2022   Procedure: TRANSURETHRAL RESECTION OF BLADDER TUMOR (TURBT);  Surgeon: Alvaro Hummer, MD;  Location: [facility];  Service: Urology;  Laterality: N/A;   UMBILICAL HERNIA REPAIR  03/03/2023   Procedure: HERNIA REPAIR UMBILICAL;  Surgeon: Alvaro Hummer KATHEE Mickey., MD;  Location: [facility];  Service: Urology;;     Social History:   reports that he quit smoking about 14 years ago. His smoking use included cigarettes. He started smoking about 54 years ago. He has a 80 pack-year smoking history. He has never been exposed to tobacco smoke. He has never used smokeless tobacco. He reports that he does not drink alcohol and does not use drugs.   Family History:  His family history includes Esophageal cancer in his cousin; Lung cancer in his mother. There is no history of Colon cancer, Rectal cancer, or Stomach cancer.   Allergies Allergies[1]   Home Medications  Prior to Admission medications  Medication Sig Start Date End Date Taking? Authorizing Provider  acetaminophen  (TYLENOL ) 325 MG tablet Take 650 mg by mouth every 6 (six) hours as needed (for pain).    [provider]  amLODipine  (NORVASC ) 5 MG tablet TAKE 1 TABLET (5 MG TOTAL) BY MOUTH DAILY. 04/22/24   Neomi Johnston DASEN, PA-C  aspirin  EC 81 MG tablet Take 81 mg by mouth in the morning.    [provider]  carvedilol  (COREG ) 3.125 MG tablet TAKE 1 TABLET BY MOUTH TWICE A DAY WITH FOOD 02/26/24   Rolan Ezra RAMAN, MD  cyanocobalamin  (VITAMIN B12) 1000 MCG tablet Take 1 tablet (1,000 mcg total) by mouth daily. 06/15/23   Dorsey, John T IV, MD  diazepam  (VALIUM ) 5 MG tablet TAKE 1 TABLET BY MOUTH EVERY 12 HOURS AS NEEDED FOR ANXIETY. 03/29/23   Johnny Garnette LABOR, MD  famotidine  (PEPCID ) 20 MG tablet Take 1 tablet (20 mg total) by mouth 2 (two) times daily for 6 days. 08/15/23 09/14/24  Walisiewicz, Kaitlyn E, PA-C  ferrous sulfate  325 (65 FE) MG tablet Take 1 tablet (325 mg total) by  mouth daily with breakfast. Please take with a source of Vitamin C 09/23/23   Dorsey, John T IV, MD  furosemide  (LASIX ) 20 MG tablet TAKE 3 TABLETS BY MOUTH EVERY MORNING AND TAKE 2 TABLETS BY MOUTH EVERY EVENING 01/12/24   Rolan Ezra RAMAN, MD  ipratropium (ATROVENT ) 0.03 %  nasal spray Place 2 sprays into both nostrils every 12 (twelve) hours as needed for rhinitis.    [provider]  lidocaine -prilocaine  (EMLA ) cream Apply topically once a week. 11/29/23   Federico Norleen ONEIDA MADISON, MD  Magnesium  500 MG TABS Take 500 mg by mouth every morning.    [provider]  meclizine  (ANTIVERT ) 25 MG tablet Take 1 tablet (25 mg total) by mouth 3 (three) times daily as needed for dizziness. 09/09/22   Johnny Garnette LABOR, MD  mirtazapine  (REMERON ) 7.5 MG tablet TAKE 1 TABLET BY MOUTH AT BEDTIME. 02/16/24   Boscia, Heather E, NP  multivitamin-iron-minerals-folic acid  (CENTRUM) chewable tablet Chew 1 tablet by mouth daily. 09/23/21   Barrett, Erin R, PA-C  nitroGLYCERIN  (NITROSTAT ) 0.4 MG SL tablet Place 0.4 mg under the tongue every 5 (five) minutes as needed for chest pain. 11/03/17 12/05/25  [provider]  ondansetron  (ZOFRAN ) 8 MG tablet Take 1 tablet (8 mg total) by mouth every 8 (eight) hours as needed. 05/16/23   Federico Norleen ONEIDA MADISON, MD  pantoprazole  (PROTONIX ) 40 MG tablet TAKE 1 TABLET (40 MG TOTAL) BY MOUTH TWICE A DAY BEFORE MEALS 02/12/24   Johnny Garnette LABOR, MD  potassium chloride  SA (KLOR-CON  M) 20 MEQ tablet Take 1 tablet (20 mEq total) by mouth 2 (two) times daily. 05/10/24   Milford, Harlene HERO, FNP  prochlorperazine  (COMPAZINE ) 10 MG tablet Take 1 tablet (10 mg total) by mouth every 6 (six) hours as needed for nausea or vomiting. 05/16/23   Federico Norleen ONEIDA MADISON, MD  rosuvastatin  (CRESTOR ) 20 MG tablet Take 1 tablet (20 mg total) by mouth daily. 08/15/23   Johnny Garnette LABOR, MD  temazepam  (RESTORIL ) 30 MG capsule Take 1 capsule (30 mg total) by mouth at bedtime as needed for sleep. 12/06/23   Johnny Garnette LABOR, MD  traMADol  (ULTRAM ) 50 MG tablet Take 1 tablet (50 mg total) by mouth every 6 (six) hours as needed. 08/09/23   Thayil, Irene T, PA-C  venlafaxine  XR (EFFEXOR -XR) 150 MG 24 hr capsule Take 1 capsule (150 mg total) by mouth daily with breakfast. 12/06/23   Johnny Garnette LABOR, MD    Critical care time: The patient is critically ill with multiple organ systems failure and requires high complexity decision making for assessment and support, frequent evaluation and titration of therapies, application of advanced monitoring technologies and extensive interpretation of multiple databases.  Critical care time 39 mins. This represents my time independent of the NPs time taking care of the pt. This is excluding procedures.    Harlene Na DO [facility] 05/29/2024, 7:39 PM See Amion for pager If no response to pager, please call [phone] until 1900 After 1900 please call [facility] [phone]              [1] No Known Allergies  "

## 2024-05-29 NOTE — Progress Notes (Signed)
 eLink Physician-Brief Progress Note Patient Name: Luis [name] DOB: 10/19/1955 MRN: [id number]   Date of Service  05/29/2024  HPI/Events of Note  Patient with a history of bladder cancer s/p a urological procedure developed hypotension r/o sepsis, also modest drop in hemoglobin from pre-operative baseline, admitted to the ICU for close monitoring.  eICU Interventions  New Patient Evaluation.        [name] Ralph Brouwer 05/29/2024, 9:26 PM

## 2024-05-29 NOTE — Progress Notes (Incomplete)
 This RN was notified by PACU RN that 1 unit PRBCs was ordered for patient. On arrival to ICU/SD 1 unit PRBCs was hung and prepared but not connected to patients IV site. PACU RN explained to this RN that patients wife mentioned he had a history of transfusion reactions therefore, PACU RN did not began transfusion before medication prophylaxis was considered. This RN requested that provider order medication to prevent transfusion reaction and orders for a new unit of PRBCs because the first was clotted and this RN was unsure how long PRBCs were hung before arrival. The unit number for the discarded PRBCs is [id number]. Benadryl  and new unit of PRBCs administered without reaction.

## 2024-05-29 NOTE — Progress Notes (Deleted)
" °  History and Physical       Physical Exam: T= 102, 89,   General: pale, ill appearing HEENT: Normocephalic, atraumatic, PERRL, oral mucosa is dry Cardiovascular: Normal rate and rhythm. Distal pulses intact.systolic murmur Pulmonary: Normal pulmonary effort, normal breath sounds Gastrointestinal: Nondistended abdomen, soft, non-tender, hypoactive bowel sounds,  Anterior urostomy has bright red bloody urine in bag Musculoskeletal:Normal ROM, no lower ext edema Lymphadenopathy: No cervical LAD. Skin: Skin is warm and dry. Neuro: strength exam is deferred, AAOx3. PSYCH: Keeps eyes closed but cooperative     Assessment and Plan: Sepsis post procedure Hypotension Acute blood loss anemia Gross hemeturia post procedure - Transfuse 1 unit PRBCs to start - IVF, Pressors if necessary - IV Cipro  - Admit to stepdown - Consider PCCM consult if he does not stabilize   Advance Care Planning:   Code Status: Full Code  Wife says she is HCPOA and he is full code. She says he has recently changed his mind from DNR.   Family Communication: Wife at bedside  ARTHEA CHILD, MD 05/29/2024 6:41 PM  For on call review [email or web address].   "

## 2024-05-30 ENCOUNTER — Inpatient Hospital Stay (HOSPITAL_COMMUNITY)

## 2024-05-30 ENCOUNTER — Encounter (HOSPITAL_COMMUNITY): Payer: Self-pay

## 2024-05-30 ENCOUNTER — Ambulatory Visit (HOSPITAL_COMMUNITY)

## 2024-05-30 DIAGNOSIS — R197 Diarrhea, unspecified: Secondary | ICD-10-CM

## 2024-05-30 DIAGNOSIS — A419 Sepsis, unspecified organism: Secondary | ICD-10-CM | POA: Diagnosis not present

## 2024-05-30 DIAGNOSIS — R319 Hematuria, unspecified: Secondary | ICD-10-CM

## 2024-05-30 DIAGNOSIS — I350 Nonrheumatic aortic (valve) stenosis: Secondary | ICD-10-CM

## 2024-05-30 DIAGNOSIS — K219 Gastro-esophageal reflux disease without esophagitis: Secondary | ICD-10-CM | POA: Diagnosis not present

## 2024-05-30 DIAGNOSIS — N1832 Chronic kidney disease, stage 3b: Secondary | ICD-10-CM | POA: Diagnosis not present

## 2024-05-30 DIAGNOSIS — N179 Acute kidney failure, unspecified: Secondary | ICD-10-CM | POA: Diagnosis not present

## 2024-05-30 DIAGNOSIS — D696 Thrombocytopenia, unspecified: Secondary | ICD-10-CM | POA: Diagnosis not present

## 2024-05-30 DIAGNOSIS — E785 Hyperlipidemia, unspecified: Secondary | ICD-10-CM | POA: Diagnosis not present

## 2024-05-30 DIAGNOSIS — R6521 Severe sepsis with septic shock: Secondary | ICD-10-CM | POA: Diagnosis not present

## 2024-05-30 DIAGNOSIS — Z9981 Dependence on supplemental oxygen: Secondary | ICD-10-CM | POA: Diagnosis not present

## 2024-05-30 DIAGNOSIS — D62 Acute posthemorrhagic anemia: Secondary | ICD-10-CM | POA: Diagnosis not present

## 2024-05-30 LAB — COMPREHENSIVE METABOLIC PANEL WITH GFR
ALT: 21 U/L (ref 0–44)
AST: 50 U/L — ABNORMAL HIGH (ref 15–41)
Albumin: 2.9 g/dL — ABNORMAL LOW (ref 3.5–5.0)
Alkaline Phosphatase: 165 U/L — ABNORMAL HIGH (ref 38–126)
Anion gap: 13 (ref 5–15)
BUN: 35 mg/dL — ABNORMAL HIGH (ref 8–23)
CO2: 18 mmol/L — ABNORMAL LOW (ref 22–32)
Calcium: 7.9 mg/dL — ABNORMAL LOW (ref 8.9–10.3)
Chloride: 105 mmol/L (ref 98–111)
Creatinine, Ser: 2.85 mg/dL — ABNORMAL HIGH (ref 0.61–1.24)
GFR, Estimated: 23 mL/min — ABNORMAL LOW
Glucose, Bld: 98 mg/dL (ref 70–99)
Potassium: 3.8 mmol/L (ref 3.5–5.1)
Sodium: 136 mmol/L (ref 135–145)
Total Bilirubin: 1.1 mg/dL (ref 0.0–1.2)
Total Protein: 6.2 g/dL — ABNORMAL LOW (ref 6.5–8.1)

## 2024-05-30 LAB — BPAM RBC
Blood Product Expiration Date: 202602142359
Blood Product Expiration Date: 202602232359
ISSUE DATE / TIME: 202602042101
ISSUE DATE / TIME: 202602042220
Unit Type and Rh: 9500
Unit Type and Rh: 9500

## 2024-05-30 LAB — CBC
HCT: 24.9 % — ABNORMAL LOW (ref 39.0–52.0)
Hemoglobin: 8 g/dL — ABNORMAL LOW (ref 13.0–17.0)
MCH: 31.3 pg (ref 26.0–34.0)
MCHC: 32.1 g/dL (ref 30.0–36.0)
MCV: 97.3 fL (ref 80.0–100.0)
Platelets: 99 10*3/uL — ABNORMAL LOW (ref 150–400)
RBC: 2.56 MIL/uL — ABNORMAL LOW (ref 4.22–5.81)
RDW: 19.5 % — ABNORMAL HIGH (ref 11.5–15.5)
WBC: 23.3 10*3/uL — ABNORMAL HIGH (ref 4.0–10.5)
nRBC: 0 % (ref 0.0–0.2)

## 2024-05-30 LAB — TYPE AND SCREEN
ABO/RH(D): A NEG
Antibody Screen: NEGATIVE
Unit division: 0
Unit division: 0

## 2024-05-30 LAB — URINALYSIS, W/ REFLEX TO CULTURE (INFECTION SUSPECTED)
Bilirubin Urine: NEGATIVE
Glucose, UA: 50 mg/dL — AB
Ketones, ur: NEGATIVE mg/dL
Nitrite: NEGATIVE
Protein, ur: 100 mg/dL — AB
RBC / HPF: >50 RBC/hpf (ref 0–5)
Specific Gravity, Urine: 1.012 (ref 1.005–1.030)
pH: 6 (ref 5.0–8.0)

## 2024-05-30 LAB — GLUCOSE, CAPILLARY
Glucose-Capillary: 101 mg/dL — ABNORMAL HIGH (ref 70–99)
Glucose-Capillary: 112 mg/dL — ABNORMAL HIGH (ref 70–99)
Glucose-Capillary: 135 mg/dL — ABNORMAL HIGH (ref 70–99)
Glucose-Capillary: 89 mg/dL (ref 70–99)
Glucose-Capillary: 94 mg/dL (ref 70–99)

## 2024-05-30 LAB — HEMOGLOBIN AND HEMATOCRIT, BLOOD
HCT: 24.4 % — ABNORMAL LOW (ref 39.0–52.0)
Hemoglobin: 7.9 g/dL — ABNORMAL LOW (ref 13.0–17.0)

## 2024-05-30 LAB — ECHOCARDIOGRAM COMPLETE
AR max vel: 1.66 cm2
AV Area VTI: 1.77 cm2
AV Area mean vel: 1.8 cm2
AV Mean grad: 12 mmHg
AV Peak grad: 26.6 mmHg
Ao pk vel: 2.58 m/s
Area-P 1/2: 3.14 cm2
Calc EF: 72.1 %
Height: 66 in
S' Lateral: 3.2 cm
Single Plane A2C EF: 77.3 %
Single Plane A4C EF: 64.6 %
Weight: 3523.83 [oz_av]

## 2024-05-30 LAB — MAGNESIUM
Magnesium: 1.4 mg/dL — ABNORMAL LOW (ref 1.7–2.4)
Magnesium: 2 mg/dL (ref 1.7–2.4)

## 2024-05-30 LAB — PHOSPHORUS: Phosphorus: 3.9 mg/dL (ref 2.5–4.6)

## 2024-05-30 LAB — HIV ANTIBODY (ROUTINE TESTING W REFLEX): HIV Screen 4th Generation wRfx: NONREACTIVE

## 2024-05-30 LAB — HEMOGLOBIN A1C
Hgb A1c MFr Bld: 5.1 % (ref 4.8–5.6)
Mean Plasma Glucose: 99.67 mg/dL

## 2024-05-30 LAB — GAMMA GT: GGT: 143 U/L — ABNORMAL HIGH (ref 7–50)

## 2024-05-30 MED ORDER — VENLAFAXINE HCL ER 150 MG PO CP24
150.0000 mg | ORAL_CAPSULE | Freq: Every day | ORAL | Status: AC
  Administered 2024-05-31: 150 mg via ORAL
  Filled 2024-05-30: qty 1

## 2024-05-30 MED ORDER — HEPARIN SODIUM (PORCINE) 5000 UNIT/ML IJ SOLN
5000.0000 [IU] | Freq: Three times a day (TID) | INTRAMUSCULAR | Status: AC
  Administered 2024-05-30 – 2024-05-31 (×5): 5000 [IU] via SUBCUTANEOUS
  Filled 2024-05-30 (×5): qty 1

## 2024-05-30 MED ORDER — ORAL CARE MOUTH RINSE: 15.0000 mL | OROMUCOSAL | Status: AC | PRN

## 2024-05-30 MED ORDER — PERFLUTREN LIPID MICROSPHERE
1.0000 mL | INTRAVENOUS | Status: AC | PRN
  Administered 2024-05-30: 2 mL via INTRAVENOUS

## 2024-05-30 MED ORDER — LINEZOLID 600 MG PO TABS: 600.0000 mg | ORAL_TABLET | Freq: Two times a day (BID) | ORAL | Status: DC

## 2024-05-30 MED ORDER — PANTOPRAZOLE SODIUM 40 MG PO TBEC
40.0000 mg | DELAYED_RELEASE_TABLET | Freq: Two times a day (BID) | ORAL | Status: AC
  Administered 2024-05-30 – 2024-05-31 (×4): 40 mg via ORAL
  Filled 2024-05-30 (×4): qty 1

## 2024-05-30 MED ORDER — PIPERACILLIN-TAZOBACTAM 3.375 G IVPB
3.3750 g | Freq: Three times a day (TID) | INTRAVENOUS | Status: AC
  Administered 2024-05-30 – 2024-05-31 (×5): 3.375 g via INTRAVENOUS
  Filled 2024-05-30 (×5): qty 50

## 2024-05-30 MED ORDER — LACTATED RINGERS IV BOLUS
500.0000 mL | Freq: Once | INTRAVENOUS | Status: AC
  Administered 2024-05-30: 500 mL via INTRAVENOUS

## 2024-05-30 MED ORDER — ASPIRIN 81 MG PO TBEC
81.0000 mg | DELAYED_RELEASE_TABLET | Freq: Every day | ORAL | Status: AC
  Administered 2024-05-31: 81 mg via ORAL
  Filled 2024-05-30: qty 1

## 2024-05-30 MED ORDER — ONDANSETRON HCL 4 MG/2ML IJ SOLN: 4.0000 mg | Freq: Four times a day (QID) | INTRAMUSCULAR | Status: DC | PRN

## 2024-05-30 MED ORDER — DIAZEPAM 5 MG PO TABS
5.0000 mg | ORAL_TABLET | Freq: Two times a day (BID) | ORAL | Status: AC | PRN
  Administered 2024-05-30 (×2): 5 mg via ORAL
  Filled 2024-05-30 (×2): qty 1

## 2024-05-30 MED ORDER — ROSUVASTATIN CALCIUM 20 MG PO TABS
20.0000 mg | ORAL_TABLET | Freq: Every day | ORAL | Status: AC
  Administered 2024-05-30 – 2024-05-31 (×2): 20 mg via ORAL
  Filled 2024-05-30 (×2): qty 1

## 2024-05-30 MED ORDER — LINEZOLID 600 MG/300ML IV SOLN
600.0000 mg | Freq: Two times a day (BID) | INTRAVENOUS | Status: AC
  Administered 2024-05-30 – 2024-05-31 (×4): 600 mg via INTRAVENOUS
  Filled 2024-05-30 (×4): qty 300

## 2024-05-30 MED ORDER — CIPROFLOXACIN IN D5W 400 MG/200ML IV SOLN: 400.0000 mg | Freq: Two times a day (BID) | INTRAVENOUS | Status: DC

## 2024-05-30 MED ORDER — MAGNESIUM SULFATE 2 GM/50ML IV SOLN
2.0000 g | Freq: Once | INTRAVENOUS | Status: AC
  Administered 2024-05-30: 2 g via INTRAVENOUS
  Filled 2024-05-30: qty 50

## 2024-05-30 MED ORDER — CHLORHEXIDINE GLUCONATE CLOTH 2 % EX PADS
6.0000 | MEDICATED_PAD | Freq: Every day | CUTANEOUS | Status: DC
  Administered 2024-05-30 – 2024-05-31 (×2): 6 via TOPICAL

## 2024-05-30 MED ORDER — CIPROFLOXACIN IN D5W 400 MG/200ML IV SOLN: 400.0000 mg | Freq: Every day | INTRAVENOUS | Status: DC

## 2024-05-30 NOTE — Plan of Care (Signed)

## 2024-05-30 NOTE — Progress Notes (Addendum)
 "  NAME:  Luis [name], MRN:  [id number], DOB:  [birth year], LOS: 1 ADMISSION DATE:  05/29/2024, CONSULTATION DATE:  05/29/24 REFERRING MD:  TRH, CHIEF COMPLAINT:  relative hypotension/sepsis post procedural   History of Present Illness:  69 yo male presented for outpt procedure of b/l nephrostomy tubes conversion to nephroureteral catheters (already urostomy in place). Pt is undergoing chemo for invasive bladder cancer with next round in 2 two days. Post procedure pt developed fever to 102 and was found to be hypotensive, responsive to fluids. He was also found to have hemoglobin of 7.6 from baseline of 8.5. Prior to procedure pt was in his normal state of health. He denies any complaints at this time states he is feeling better post operatively now than he was. Immediately post op, c/o dizziness and chills only.   Ccm was consulted with pt's relative hypotension and potential for declining post procedurally, possibly requiring pressors.   Pertinent  Medical History   Past Medical History:  Diagnosis Date   Anxiety    takes Valium  as needed   Aortic stenosis, moderate    Arthritis    Ascending aortic aneurysm    CAD (coronary artery disease)    a. s/p PCI of the RCA 8/12 with DES by Dr Wonda, preserved EF. b. LHC/RHC (2/16) with mean RA 12, PA 32/15, mean PCWP 18, CI 3.47; patent mid and distal RCA stents, 50-60% proximal stenosis small PDA.      Cancer [facility])    bladder   Carotid stenosis    a. Carotid US  (05/2013):  Bilateral 1-39% ICA; L thyroid  nodule (prior hx of aspiration).   Chronic diastolic CHF (congestive heart failure) (HCC)    Complication of anesthesia    difficulty waking up after gallbladder surgery   Depression    Dyspnea    Essential hypertension    GERD (gastroesophageal reflux disease)    if needed will take OTC meds    Heart murmur    History of colonic polyps    hyperplastic   Hyperlipidemia    Joint pain    Lesion of bladder    Myocardial infarction (HCC)  2012   Obesity (BMI 30-39.9) 02/29/2016   Pre-diabetes    Restless leg    Sleep apnea    uses cpap   Tubular adenoma of colon    Vertigo    takes Meclizine  as needed     Significant Hospital Events: Including procedures, antibiotic start and stop dates in addition to other pertinent events   Outpt procedure 2/4 with subsequent concern for sepsis Admitted to progressive 2/4 Ccm consult 2/4 for ongoing hypotension and fever, started on low dose Levo & Cipro   Interim History / Subjective:  Patient reports feeling well today and no pain. Denies SOB or dizziness  Objective    Blood pressure (!) 99/47, pulse 73, temperature 98.4 F (36.9 C), temperature source Oral, resp. rate (!) 24, height 5' 6 (1.676 m), weight 99.9 kg, SpO2 96%.        Intake/Output Summary (Last 24 hours) at 05/30/2024 0919 Last data filed at 05/30/2024 0800 Gross per 24 hour  Intake 506.16 ml  Output 950 ml  Net -443.84 ml   Filed Weights   05/29/24 2128  Weight: 99.9 kg    Examination: General: lying in bed, no acute distress HENT: trachea midline, mucous membranes moist, normocephalic, atraumatic Lungs: bilateral upper lobes LCTA, fine crackles noted in lung bases, no rhonci or wheezing. Even, unlabored respirations Cardiovascular: RRR, S1 &  S2 appreciated, no murmurs, gallops, or rubs, no edema, pulses +2/+2 Abdomen: soft, rounded, non-tender, active bowel sounds Extremities: no cyanosis, warm to touch Neuro: AO x4, no deficiets   Resolved problem list   Assessment and Plan  Bladder cancer, s/p cystectomy  Acute hypotension Sepsis Shock suspected 2/2 urologic source B/l nephrostomy Patient with hypotension post procedure with new onset fevering and increase in WBC to 23.3 from 8.5. Patient was started on Cipro  for concern for sepsis secondary to urologic source. Patient has a hx of urosepsis 2/2 Klebsiella, ESBL citrobacter, and E. Facelis last year.  - Switch ABX coverage to Zosyn  & d/c Cipro   given increasing pressor needs & hx of urosepsis. With hx of ESBL, low threshold to increase coverage to [facility] if patient status worsens - Order UA/cx - Once Echo & CXR back, can consider giving additional fluid resuscitation - On Levo 3mcg, titrate for MAP goal >65  Acute blood loss anemia  Anemia of Chronic Disease Hematuria Acute on chronic thrombocytopenia Hx Blood transfusion reaction Baseline Hbg ~8-8.6. Patient reports hx of requiring frequent blood transfusions and hx of blood transfusion reaction - Patient received 1 unit PRBC post procedure for Hbg of 7 w/ hypotension. Rpt was 8, similar to baseline - Trend H&H q6h, transfuse for Hbg <7  - Pre-medicate for any blood transfusions given hx of reaction - hold home ferrous sulfate  given acute infectious state  Aki on ckd 3b Baseline Cr ~1.6 - 2.4 - Mildly elevated Cr up to 2.85, likely related to decrease renal profusion d/t hypotension - Trend BMP, Mag, Phos - Replace e-lytes as needed - Strict I&O - Avoid nephrotoxic medications  Acute Diarrhea Fecal Incontinence  Patient reports new diarrhea/ incontinence with no hx of fecal incontinence or diarrhea, baseline reports being constipated and using stool softeners. Wife states he typically gets diarrhea while in the hospital which leads to suspicion for ABX associated diarrhea. Diarrhea started post procedure and is having episodes x2 per hour. - Suspicion for hypovolemia component to hypotension, will replace volume status as needed - GI panel/Cdiff pending - Enteric precautions - can consider antidiarrheal/ stool bulkers once r/o infection  Mod Ao stenosis s/p valve replacement Hx Diastolic HF Hx hypertension Hx CAD Acute Oxygen Dependence  Hx of diastolic HF and moderate Ao stenosis. Most recent echo in 2024 with normal EF (65%) and normal RV with no Ao stenosis present & edwards valve present.  - Patient requiring up to 4L post Op w/ no hx of home oxygen use. CXR ordered  to assess possible presence of pulmonary edema given DHF Hx - Wean Oxygen as tolerated - IS - Repeat Echo - On home norvasc  & carvedilol , hold given hypotension - Hold home Aspirin  given acute anemia, can consider restarting in H&H stable - Hold home PO lasix  given hypotension  [place] - Continue home Protonix   Hyperlipidemia - On home crestor , continue   Vertigo -Held home medication Meclizine  w/ acute hypotension   H/o anxiety H/o depression - Restart home medication Effexor - XR 150mg    Prediabetes Hx of pre-diabetes at 5.9%, most recent his visit of 5.1% - CBG Q4h, SSI - If CBG remain stable w/o need for insulin  and doesn't require steroids, can consider stopping CBG check given no longer pre-diabetic A1c    Labs   CBC: Recent Labs  Lab 05/24/24 1347 05/29/24 1230 05/29/24 2219 05/30/24 0604  WBC 3.8* 4.4 8.5 23.3*  NEUTROABS 2.7 3.3 8.0*  --   HGB 8.3* 7.6* 7.0* 8.0*  HCT 25.1* 23.9* 22.2* 24.9*  MCV 93.7 98.0 98.2 97.3  PLT 103* 111* 81* 99*    Basic Metabolic Panel: Recent Labs  Lab 05/24/24 1347 05/29/24 1230 05/29/24 2219 05/30/24 0604  NA 136 137 136 136  K 4.4 3.9 3.8 3.8  CL 104 104 106 105  CO2 22 21* 18* 18*  GLUCOSE 120* 116* 100* 98  BUN 26* 31* 33* 35*  CREATININE 2.04* 2.30* 2.66* 2.85*  CALCIUM  8.7* 8.6* 7.6* 7.9*   GFR: Estimated Creatinine Clearance: 26.7 mL/min (A) (by C-G formula based on SCr of 2.85 mg/dL (H)). Recent Labs  Lab 05/24/24 1347 05/29/24 1230 05/29/24 2219 05/30/24 0604  WBC 3.8* 4.4 8.5 23.3*    Liver Function Tests: Recent Labs  Lab 05/24/24 1347 05/29/24 2219 05/30/24 0604  AST 27 49* 50*  ALT 13 20 21   ALKPHOS 147* 182* 165*  BILITOT 0.7 0.8 1.1  PROT 7.6 5.8* 6.2*  ALBUMIN  3.6 2.6* 2.9*   No results for input(s): LIPASE, AMYLASE in the last 168 hours. No results for input(s): AMMONIA in the last 168 hours.  ABG    Component Value Date/Time   PHART 7.323 (L) 03/03/2023 1144   PCO2ART  43.9 03/03/2023 1144   PO2ART 143 (H) 03/03/2023 1144   HCO3 24.6 06/15/2023 0929   HCO3 24.2 06/15/2023 0929   TCO2 21 (L) 06/26/2023 2054   ACIDBASEDEF 1.0 06/15/2023 0929   ACIDBASEDEF 1.0 06/15/2023 0929   O2SAT 57 06/15/2023 0929   O2SAT 61 06/15/2023 0929     Coagulation Profile: Recent Labs  Lab 05/29/24 1230  INR 1.1    Cardiac Enzymes: No results for input(s): CKTOTAL, CKMB, CKMBINDEX, TROPONINI in the last 168 hours.  HbA1C: Hgb A1c MFr Bld  Date/Time Value Ref Range Status  05/29/2024 10:19 PM 5.1 4.8 - 5.6 % Final    Comment:    (NOTE) Diagnosis of Diabetes The following HbA1c ranges recommended by the American Diabetes Association (ADA) may be used as an aid in the diagnosis of diabetes mellitus.  Hemoglobin             Suggested A1C NGSP%              Diagnosis  <5.7                   Non Diabetic  5.7-6.4                Pre-Diabetic  >6.4                   Diabetic  <7.0                   Glycemic control for                       adults with diabetes.    05/30/2022 12:52 PM 5.9 (H) 4.8 - 5.6 % Final    Comment:    (NOTE) Pre diabetes:          5.7%-6.4%  Diabetes:              >6.4%  Glycemic control for   <7.0% adults with diabetes     CBG: Recent Labs  Lab 05/30/24 0002  GLUCAP 94    Review of Systems:   As per HPI  Past Medical History:  He,  has a past medical history of Anxiety, Aortic stenosis, moderate, Arthritis, Ascending aortic aneurysm, CAD (coronary artery disease), Cancer (  HCC), Carotid stenosis, Chronic diastolic CHF (congestive heart failure) (HCC), Complication of anesthesia, Depression, Dyspnea, Essential hypertension, GERD (gastroesophageal reflux disease), Heart murmur, History of colonic polyps, Hyperlipidemia, Joint pain, Lesion of bladder, Myocardial infarction (HCC) (2012), Obesity (BMI 30-39.9) (02/29/2016), Pre-diabetes, Restless leg, Sleep apnea, Tubular adenoma of colon, and Vertigo.   Surgical  History:   Past Surgical History:  Procedure Laterality Date   AORTIC VALVE REPLACEMENT N/A 09/16/2021   Procedure: AORTIC VALVE REPLACEMENT (AVR);  Surgeon: Lucas Dorise POUR, MD;  Location: [facility] OR;  Service: Open Heart Surgery;  Laterality: N/A;   CATARACT EXTRACTION   4 YRS AGO   BOTH EYES   CHOLECYSTECTOMY  07/21/2011   Procedure: LAPAROSCOPIC CHOLECYSTECTOMY WITH INTRAOPERATIVE CHOLANGIOGRAM;  Surgeon: Alm VEAR Angle, MD;  Location: [facility]S;  Service: General;  Laterality: N/A;   CORONARY ANGIOPLASTY  2012   2 stents   coronary stenting     s/p PCI of the RCA by Dr Wonda 8/12 with 2 promus stents   CYSTOSCOPY W/ RETROGRADES Bilateral 12/13/2019   Procedure: CYSTOSCOPY WITH RETROGRADE PYELOGRAM;  Surgeon: Alvaro Hummer, MD;  Location: [facility];  Service: Urology;  Laterality: Bilateral;   CYSTOSCOPY W/ RETROGRADES Bilateral 10/14/2020   Procedure: CYSTOSCOPY WITH RETROGRADE PYELOGRAM;  Surgeon: Alvaro Hummer, MD;  Location: [facility];  Service: Urology;  Laterality: Bilateral;   CYSTOSCOPY W/ RETROGRADES Bilateral 12/16/2020   Procedure: CYSTOSCOPY WITH RETROGRADE PYELOGRAM;  Surgeon: Alvaro Hummer, MD;  Location: [facility]  Service: Urology;  Laterality: Bilateral;   CYSTOSCOPY W/ RETROGRADES Bilateral 06/03/2022   Procedure: CYSTOSCOPY WITH RETROGRADE PYELOGRAM;  Surgeon: Alvaro Hummer, MD;  Location: [facility];  Service: Urology;  Laterality: Bilateral;   CYSTOSCOPY W/ RETROGRADES Bilateral 12/28/2022   Procedure: CYSTOSCOPY WITH RETROGRADE PYELOGRAM, FULGARATION OF BLEEDERS;  Surgeon: Alvaro Hummer KATHEE Mickey., MD;  Location: [facility];  Service: Urology;  Laterality: Bilateral;   CYSTOSCOPY WITH INJECTION N/A 03/03/2023   Procedure: CYSTOSCOPY WITH INDOCYANINE INJECTION;  Surgeon: Alvaro Hummer KATHEE Mickey., MD;  Location: [facility];  Service: Urology;  Laterality: N/A;  360 MINUTES   ESOPHAGOGASTRODUODENOSCOPY N/A 06/28/2023   Procedure: EGD  (ESOPHAGOGASTRODUODENOSCOPY);  Surgeon: Charlanne Groom, MD;  Location: [facility];  Service: Gastroenterology;  Laterality: N/A;   IR IMAGING GUIDED PORT INSERTION  05/29/2023   IR NEPHROSTOMY EXCHANGE LEFT  08/07/2023   IR NEPHROSTOMY EXCHANGE LEFT  10/02/2023   IR NEPHROSTOMY EXCHANGE LEFT  11/27/2023   IR NEPHROSTOMY EXCHANGE LEFT  01/10/2024   IR NEPHROSTOMY EXCHANGE LEFT  02/21/2024   IR NEPHROSTOMY EXCHANGE LEFT  04/03/2024   IR NEPHROSTOMY PLACEMENT RIGHT  07/01/2023   IR RADIOLOGY PERIPHERAL GUIDED IV START  07/01/2023   IR US  GUIDE VASC ACCESS LEFT  07/01/2023   LEFT AND RIGHT HEART CATHETERIZATION WITH CORONARY ANGIOGRAM N/A 06/23/2014   Procedure: LEFT AND RIGHT HEART CATHETERIZATION WITH CORONARY ANGIOGRAM;  Surgeon: Ezra GORMAN Shuck, MD;  Location: [facility] CATH LAB;  Service: Cardiovascular;  Laterality: N/A;   NECK SURGERY  03/23/09   per Dr. Barbarann, cervical fusion    PERICARDIOCENTESIS N/A 09/28/2021   Procedure: PERICARDIOCENTESIS;  Surgeon: Dann Candyce GORMAN, MD;  Location: [facility] INVASIVE CV LAB;  Service: Cardiovascular;  Laterality: N/A;   REPLACEMENT ASCENDING AORTA N/A 09/16/2021   Procedure: REPLACEMENT ASCENDING AORTA WITH 30 X HEMASHIELD PLATINUM WOVEN DOUBLE VELOUR VASCULAR GRAFT;  Surgeon: Lucas Dorise POUR, MD;  Location: [facility] OR;  Service: Open Heart Surgery;  Laterality: N/A;  CIRC ARREST  right elbow surgery     RIGHT HEART CATH N/A 06/15/2023   Procedure: RIGHT HEART CATH;  Surgeon: Rolan Ezra RAMAN, MD;  Location: [facility] INVASIVE CV LAB;  Service: Cardiovascular;  Laterality: N/A;   RIGHT HEART CATH AND CORONARY ANGIOGRAPHY N/A 07/08/2021   Procedure: RIGHT HEART CATH AND CORONARY ANGIOGRAPHY;  Surgeon: Rolan Ezra RAMAN, MD;  Location: [facility] INVASIVE CV LAB;  Service: Cardiovascular;  Laterality: N/A;   ROBOT ASSISTED LAPAROSCOPIC COMPLETE CYSTECT ILEAL CONDUIT N/A 03/03/2023   Procedure: XI ROBOTIC ASSISTED LAPAROSCOPIC COMPLETE CYSTECTECTOMY WITH  ILEAL CONDUIT DIVERSION;  Surgeon: Alvaro Ricardo KATHEE Mickey., MD;  Location: [facility];  Service: Urology;  Laterality: N/A;   ROBOT ASSISTED LAPAROSCOPIC RADICAL PROSTATECTOMY N/A 03/03/2023   Procedure: XI ROBOTIC ASSISTED LAPAROSCOPIC RADICAL PROSTATECTOMY WITH LYMPH NODE DISSECTION;  Surgeon: Alvaro Ricardo KATHEE Mickey., MD;  Location: [facility];  Service: Urology;  Laterality: N/A;   solonscopy  05/23/08   per Dr. Jakie inch hemorrhoids only, repeat in 5 years   SUBXYPHOID PERICARDIAL WINDOW N/A 09/28/2021   Procedure: SUBXYPHOID PERICARDIAL WINDOW;  Surgeon: Lucas Dorise POUR, MD;  Location: [facility] OR;  Service: Thoracic;  Laterality: N/A;   TEE WITHOUT CARDIOVERSION N/A 06/23/2014   Procedure: TRANSESOPHAGEAL ECHOCARDIOGRAM (TEE);  Surgeon: Ezra RAMAN Rolan, MD;  Location: [facility] ENDOSCOPY;  Service: Cardiovascular;  Laterality: N/A;   TEE WITHOUT CARDIOVERSION N/A 01/21/2016   Procedure: TRANSESOPHAGEAL ECHOCARDIOGRAM (TEE);  Surgeon: Ezra RAMAN Rolan, MD;  Location: [facility] ENDOSCOPY;  Service: Cardiovascular;  Laterality: N/A;   TEE WITHOUT CARDIOVERSION N/A 09/16/2021   Procedure: TRANSESOPHAGEAL ECHOCARDIOGRAM (TEE);  Surgeon: Lucas Dorise POUR, MD;  Location: [facility] OR;  Service: Open Heart Surgery;  Laterality: N/A;   TEE WITHOUT CARDIOVERSION N/A 09/28/2021   Procedure: TRANSESOPHAGEAL ECHOCARDIOGRAM (TEE);  Surgeon: Lucas Dorise POUR, MD;  Location: [facility] OR;  Service: Thoracic;  Laterality: N/A;   TONSILLECTOMY     TRANSESOPHAGEAL ECHOCARDIOGRAM (CATH LAB) N/A 04/17/2023   Procedure: TRANSESOPHAGEAL ECHOCARDIOGRAM;  Surgeon: Jeffrie Oneil BROCKS, MD;  Location: [facility] INVASIVE CV LAB;  Service: Cardiovascular;  Laterality: N/A;   TRANSURETHRAL RESECTION OF BLADDER TUMOR N/A 10/21/2019   Procedure: TRANSURETHRAL RESECTION OF BLADDER TUMOR (TURBT);  Surgeon: Ottelin, Mark, MD;  Location: [facility];  Service: Urology;  Laterality: N/A;   TRANSURETHRAL RESECTION OF BLADDER TUMOR N/A 12/13/2019   Procedure: TRANSURETHRAL RESECTION OF BLADDER TUMOR (TURBT);   Surgeon: Alvaro Ricardo, MD;  Location: [facility];  Service: Urology;  Laterality: N/A;  1 HR   TRANSURETHRAL RESECTION OF BLADDER TUMOR N/A 10/14/2020   Procedure: TRANSURETHRAL RESECTION OF BLADDER TUMOR (TURBT);  Surgeon: Alvaro Ricardo, MD;  Location: [facility];  Service: Urology;  Laterality: N/A;   TRANSURETHRAL RESECTION OF BLADDER TUMOR N/A 12/16/2020   Procedure: RESTAGING TRANSURETHRAL RESECTION OF BLADDER TUMOR (TURBT);  Surgeon: Alvaro Ricardo, MD;  Location: [facility];  Service: Urology;  Laterality: N/A;   TRANSURETHRAL RESECTION OF BLADDER TUMOR N/A 06/03/2022   Procedure: TRANSURETHRAL RESECTION OF BLADDER TUMOR (TURBT);  Surgeon: Alvaro Ricardo, MD;  Location: [facility];  Service: Urology;  Laterality: N/A;   UMBILICAL HERNIA REPAIR  03/03/2023   Procedure: HERNIA REPAIR UMBILICAL;  Surgeon: Alvaro Ricardo KATHEE Mickey., MD;  Location: [facility];  Service: Urology;;     Social History:   reports that he quit smoking about 14 years ago. His smoking use included cigarettes. He started smoking about 54 years ago. He has a 80 pack-year smoking history. He has never been exposed  to tobacco smoke. He has never used smokeless tobacco. He reports that he does not drink alcohol and does not use drugs.   Family History:  His family history includes Esophageal cancer in his cousin; Lung cancer in his mother. There is no history of Colon cancer, Rectal cancer, or Stomach cancer.   Allergies Allergies[1]   Home Medications  Prior to Admission medications  Medication Sig Start Date End Date Taking? Authorizing Provider  acetaminophen  (TYLENOL ) 325 MG tablet Take 650 mg by mouth every 6 (six) hours as needed (for pain).    [provider]  amLODipine  (NORVASC ) 5 MG tablet TAKE 1 TABLET (5 MG TOTAL) BY MOUTH DAILY. 04/22/24   Neomi Johnston DASEN, PA-C  aspirin  EC 81 MG tablet Take 81 mg by mouth in the morning.    [provider]   carvedilol  (COREG ) 3.125 MG tablet TAKE 1 TABLET BY MOUTH TWICE A DAY WITH FOOD 02/26/24   Rolan Ezra RAMAN, MD  cyanocobalamin  (VITAMIN B12) 1000 MCG tablet Take 1 tablet (1,000 mcg total) by mouth daily. 06/15/23   Dorsey, John T IV, MD  diazepam  (VALIUM ) 5 MG tablet TAKE 1 TABLET BY MOUTH EVERY 12 HOURS AS NEEDED FOR ANXIETY. 03/29/23   Johnny Garnette LABOR, MD  famotidine  (PEPCID ) 20 MG tablet Take 1 tablet (20 mg total) by mouth 2 (two) times daily for 6 days. 08/15/23 09/14/24  Walisiewicz, Kaitlyn E, PA-C  ferrous sulfate  325 (65 FE) MG tablet Take 1 tablet (325 mg total) by mouth daily with breakfast. Please take with a source of Vitamin C 09/23/23   Dorsey, John T IV, MD  furosemide  (LASIX ) 20 MG tablet TAKE 3 TABLETS BY MOUTH EVERY MORNING AND TAKE 2 TABLETS BY MOUTH EVERY EVENING 01/12/24   McLean, Dalton S, MD  ipratropium (ATROVENT ) 0.03 % nasal spray Place 2 sprays into both nostrils every 12 (twelve) hours as needed for rhinitis.    [provider]  lidocaine -prilocaine  (EMLA ) cream Apply topically once a week. 11/29/23   Federico Norleen DASEN MADISON, MD  Magnesium  500 MG TABS Take 500 mg by mouth every morning.    [provider]  meclizine  (ANTIVERT ) 25 MG tablet Take 1 tablet (25 mg total) by mouth 3 (three) times daily as needed for dizziness. 09/09/22   Johnny Garnette LABOR, MD  mirtazapine  (REMERON ) 7.5 MG tablet TAKE 1 TABLET BY MOUTH AT BEDTIME. 02/16/24   Boscia, Heather E, NP  multivitamin-iron-minerals-folic acid  (CENTRUM) chewable tablet Chew 1 tablet by mouth daily. 09/23/21   Barrett, Erin R, PA-C  nitroGLYCERIN  (NITROSTAT ) 0.4 MG SL tablet Place 0.4 mg under the tongue every 5 (five) minutes as needed for chest pain. 11/03/17 12/05/25  [provider]  ondansetron  (ZOFRAN ) 8 MG tablet Take 1 tablet (8 mg total) by mouth every 8 (eight) hours as needed. 05/16/23   Federico Norleen DASEN MADISON, MD  pantoprazole  (PROTONIX ) 40 MG tablet TAKE 1 TABLET (40 MG TOTAL) BY MOUTH TWICE A DAY BEFORE  MEALS 02/12/24   Johnny Garnette LABOR, MD  potassium chloride  SA (KLOR-CON  M) 20 MEQ tablet Take 1 tablet (20 mEq total) by mouth 2 (two) times daily. 05/10/24   Milford, Harlene HERO, FNP  prochlorperazine  (COMPAZINE ) 10 MG tablet Take 1 tablet (10 mg total) by mouth every 6 (six) hours as needed for nausea or vomiting. 05/16/23   Federico Norleen DASEN MADISON, MD  rosuvastatin  (CRESTOR ) 20 MG tablet Take 1 tablet (20 mg total) by mouth daily. 08/15/23   Johnny Garnette  A, MD  temazepam  (RESTORIL ) 30 MG capsule Take 1 capsule (30 mg total) by mouth at bedtime as needed for sleep. 12/06/23   Johnny Garnette LABOR, MD  traMADol  (ULTRAM ) 50 MG tablet Take 1 tablet (50 mg total) by mouth every 6 (six) hours as needed. 08/09/23   Thayil, Irene T, PA-C  venlafaxine  XR (EFFEXOR -XR) 150 MG 24 hr capsule Take 1 capsule (150 mg total) by mouth daily with breakfast. 12/06/23   Johnny Garnette LABOR, MD    The patient is critically ill with multiple organ system failure and requires high complexity decision making for assessment and support, frequent evaluation and titration of therapies, advanced monitoring, review of radiographic studies and interpretation of complex data.    Critical Care Time devoted to patient care services, exclusive of separately billable procedures, described in this note is 45 min  Alveria LABOR Shoulder, NP [facility] 05/30/24 10:22 AM  Please see [email or web address] for pager details  From 7A-7P. If no response, please call [phone] After hours, please call Elink: [phone]             [1] No Known Allergies  "

## 2024-05-30 NOTE — Progress Notes (Signed)
 "   Referring Physician(s): Dr. Ricardo Likens  Supervising Physician: Jennefer Rover  Patient Status:  [facility] - In-pt  Chief Complaint: Urosepsis  Subjective: Patient lying in bed.  Alert and conversant.  States he is feeling better, however remains on pressors and IV abx.  Also with rectal tube for diarrhea overnight, although tube without output currently.  Denies pain.  Blood-tinged urine without clots in his conduit collecting bag.  Tubes visible.  Allergies: Patient has no known allergies.  Medications: Prior to Admission medications  Medication Sig Start Date End Date Taking? Authorizing Provider  acetaminophen  (TYLENOL ) 325 MG tablet Take 650 mg by mouth every 6 (six) hours as needed (for pain).    [provider]  amLODipine  (NORVASC ) 5 MG tablet TAKE 1 TABLET (5 MG TOTAL) BY MOUTH DAILY. 04/22/24   Neomi Johnston DASEN, PA-C  aspirin  EC 81 MG tablet Take 81 mg by mouth in the morning.    [provider]  carvedilol  (COREG ) 3.125 MG tablet TAKE 1 TABLET BY MOUTH TWICE A DAY WITH FOOD 02/26/24   Rolan Ezra RAMAN, MD  cyanocobalamin  (VITAMIN B12) 1000 MCG tablet Take 1 tablet (1,000 mcg total) by mouth daily. 06/15/23   Dorsey, John T IV, MD  diazepam  (VALIUM ) 5 MG tablet TAKE 1 TABLET BY MOUTH EVERY 12 HOURS AS NEEDED FOR ANXIETY. 03/29/23   Johnny Garnette LABOR, MD  famotidine  (PEPCID ) 20 MG tablet Take 1 tablet (20 mg total) by mouth 2 (two) times daily for 6 days. 08/15/23 09/14/24  Walisiewicz, Kaitlyn E, PA-C  ferrous sulfate  325 (65 FE) MG tablet Take 1 tablet (325 mg total) by mouth daily with breakfast. Please take with a source of Vitamin C 09/23/23   Dorsey, John T IV, MD  furosemide  (LASIX ) 20 MG tablet TAKE 3 TABLETS BY MOUTH EVERY MORNING AND TAKE 2 TABLETS BY MOUTH EVERY EVENING 01/12/24   McLean, Dalton S, MD  ipratropium (ATROVENT ) 0.03 % nasal spray Place 2 sprays into both nostrils every 12 (twelve) hours as needed for rhinitis.    [provider]   lidocaine -prilocaine  (EMLA ) cream Apply topically once a week. 11/29/23   Federico Norleen DASEN MADISON, MD  Magnesium  500 MG TABS Take 500 mg by mouth every morning.    [provider]  meclizine  (ANTIVERT ) 25 MG tablet Take 1 tablet (25 mg total) by mouth 3 (three) times daily as needed for dizziness. 09/09/22   Johnny Garnette LABOR, MD  mirtazapine  (REMERON ) 7.5 MG tablet TAKE 1 TABLET BY MOUTH AT BEDTIME. 02/16/24   Boscia, Heather E, NP  multivitamin-iron-minerals-folic acid  (CENTRUM) chewable tablet Chew 1 tablet by mouth daily. 09/23/21   Barrett, Erin R, PA-C  nitroGLYCERIN  (NITROSTAT ) 0.4 MG SL tablet Place 0.4 mg under the tongue every 5 (five) minutes as needed for chest pain. 11/03/17 12/05/25  [provider]  ondansetron  (ZOFRAN ) 8 MG tablet Take 1 tablet (8 mg total) by mouth every 8 (eight) hours as needed. 05/16/23   Federico Norleen DASEN MADISON, MD  pantoprazole  (PROTONIX ) 40 MG tablet TAKE 1 TABLET (40 MG TOTAL) BY MOUTH TWICE A DAY BEFORE MEALS 02/12/24   Johnny Garnette LABOR, MD  potassium chloride  SA (KLOR-CON  M) 20 MEQ tablet Take 1 tablet (20 mEq total) by mouth 2 (two) times daily. 05/10/24   Milford, Harlene HERO, FNP  prochlorperazine  (COMPAZINE ) 10 MG tablet Take 1 tablet (10 mg total) by mouth every 6 (six) hours as needed for nausea or vomiting. 05/16/23   Federico Norleen DASEN  IV, MD  rosuvastatin  (CRESTOR ) 20 MG tablet Take 1 tablet (20 mg total) by mouth daily. 08/15/23   Johnny Garnette LABOR, MD  temazepam  (RESTORIL ) 30 MG capsule Take 1 capsule (30 mg total) by mouth at bedtime as needed for sleep. 12/06/23   Johnny Garnette LABOR, MD  traMADol  (ULTRAM ) 50 MG tablet Take 1 tablet (50 mg total) by mouth every 6 (six) hours as needed. 08/09/23   Thayil, Irene T, PA-C  venlafaxine  XR (EFFEXOR -XR) 150 MG 24 hr capsule Take 1 capsule (150 mg total) by mouth daily with breakfast. 12/06/23   Johnny Garnette LABOR, MD     Vital Signs: BP (!) 132/112   Pulse 66   Temp 98.4 F (36.9 C) (Oral)   Resp (!) 25   Ht 5' 6 (1.676 m)    Wt 220 lb 3.8 oz (99.9 kg)   SpO2 95%   BMI 35.55 kg/m   Physical Exam NAD, alert Abdomen: Ileal conduit in place.  Both tubes visible.  Blood-tinged urine in collecting bag.   Imaging: ECHOCARDIOGRAM COMPLETE Result Date: 05/30/2024    ECHOCARDIOGRAM REPORT   Patient Name:   Luis [name] Date of Exam: 05/30/2024 Medical Rec #:  [id number]        Height:       66.0 in Accession #:    [id number]       Weight:       220.2 lb Date of Birth:  [birth year]        BSA:          2.083 m Patient Age:    69 years         BP:           98/48 mmHg Patient Gender: M                HR:           72 bpm. Exam Location:  Inpatient Procedure: 2D Echo, Cardiac Doppler, Color Doppler and Intracardiac            Opacification Agent (Both Spectral and Color Flow Doppler were            utilized during procedure). Indications:    Aortic stenosis I35.0  History:        Patient has prior history of Echocardiogram examinations, most                 recent 04/15/2023. Previous Myocardial Infarction, 25 mm Edwards                 Inspiris Resilia, Signs/Symptoms:Murmur; Risk                 Factors:Hypertension.                 Aortic Valve: 25 mm Edwards Inspiris Resilia valve is present in                 the aortic position.  Sonographer:    Sydnee Wilson RDCS Referring Phys: [phone] CLAUDIA CLAIBORNE IMPRESSIONS  1. Left ventricular ejection fraction, by estimation, is 65 to 70%. The left ventricle has normal function. Left ventricular endocardial border not optimally defined to evaluate regional wall motion. There is mild left ventricular hypertrophy. Left ventricular diastolic parameters were normal.  2. Right ventricular systolic function is normal. The right ventricular size is normal. Tricuspid regurgitation signal is inadequate for assessing PA pressure.  3. The mitral valve is grossly normal. Trivial mitral valve regurgitation.  4.  The aortic valve has been repaired/replaced. Aortic valve regurgitation is not visualized.  There is a 25 mm Edwards Inspiris Resilia valve present in the aortic position. EOA by  5. by VTI measures 1.77 cm. Aortic valve mean gradient measures 12.0 mmHg. Aortic valve Vmax measures 2.58 m/s. Peak gradient 26.6 mmHg, DI 0.56.  6. The inferior vena cava is dilated in size with >50% respiratory variability, suggesting right atrial pressure of 8 mmHg. Comparison(s): Changes from prior study are noted. 04/15/2023: LVEF 65-70%, AVR with MG 9 mmHg. FINDINGS  Left Ventricle: Left ventricular ejection fraction, by estimation, is 65 to 70%. The left ventricle has normal function. Left ventricular endocardial border not optimally defined to evaluate regional wall motion. Definity  contrast agent was given IV to delineate the left ventricular endocardial borders. The left ventricular internal cavity size was normal in size. There is mild left ventricular hypertrophy. Left ventricular diastolic parameters were normal. Right Ventricle: The right ventricular size is normal. No increase in right ventricular wall thickness. Right ventricular systolic function is normal. Tricuspid regurgitation signal is inadequate for assessing PA pressure. Left Atrium: Left atrial size was normal in size. Right Atrium: Right atrial size was normal in size. Pericardium: There is no evidence of pericardial effusion. Mitral Valve: The mitral valve is grossly normal. Trivial mitral valve regurgitation. Tricuspid Valve: The tricuspid valve is grossly normal. Tricuspid valve regurgitation is trivial. Aortic Valve: The aortic valve has been repaired/replaced. Aortic valve regurgitation is not visualized. Aortic valve mean gradient measures 12.0 mmHg. Aortic valve peak gradient measures 26.6 mmHg. Aortic valve area, by VTI measures 1.77 cm. There is a  25 mm Edwards Inspiris Resilia valve present in the aortic position. Pulmonic Valve: The pulmonic valve was normal in structure. Pulmonic valve regurgitation is not visualized. Aorta: The aortic  root and ascending aorta are structurally normal, with no evidence of dilitation. Venous: The inferior vena cava is dilated in size with greater than 50% respiratory variability, suggesting right atrial pressure of 8 mmHg. IAS/Shunts: No atrial level shunt detected by color flow Doppler.  LEFT VENTRICLE PLAX 2D LVIDd:         4.60 cm      Diastology LVIDs:         3.20 cm      LV e' medial:    12.40 cm/s LV PW:         1.30 cm      LV E/e' medial:  9.9 LV IVS:        1.10 cm      LV e' lateral:   14.00 cm/s LVOT diam:     2.00 cm      LV E/e' lateral: 8.8 LV SV:         89 LV SV Index:   43 LVOT Area:     3.14 cm  LV Volumes (MOD) LV vol d, MOD A2C: 142.0 ml LV vol d, MOD A4C: 137.0 ml LV vol s, MOD A2C: 32.3 ml LV vol s, MOD A4C: 48.5 ml LV SV MOD A2C:     109.7 ml LV SV MOD A4C:     137.0 ml LV SV MOD BP:      105.6 ml RIGHT VENTRICLE RV S prime:     12.00 cm/s TAPSE (M-mode): 1.8 cm LEFT ATRIUM             Index        RIGHT ATRIUM           Index LA  diam:        5.00 cm 2.40 cm/m   RA Area:     13.90 cm LA Vol (A2C):   58.6 ml 28.13 ml/m  RA Volume:   29.80 ml  14.30 ml/m LA Vol (A4C):   43.8 ml 21.02 ml/m LA Biplane Vol: 51.2 ml 24.57 ml/m  AORTIC VALVE AV Area (Vmax):    1.66 cm AV Area (Vmean):   1.80 cm AV Area (VTI):     1.77 cm AV Vmax:           257.67 cm/s AV Vmean:          157.667 cm/s AV VTI:            0.505 m AV Peak Grad:      26.6 mmHg AV Mean Grad:      12.0 mmHg LVOT Vmax:         136.00 cm/s LVOT Vmean:        90.200 cm/s LVOT VTI:          0.285 m LVOT/AV VTI ratio: 0.56  AORTA Ao Root diam: 3.40 cm Ao Asc diam:  3.40 cm MITRAL VALVE MV Area (PHT): 3.14 cm     SHUNTS MV E velocity: 123.00 cm/s  Systemic VTI:  0.28 m MV A velocity: 112.00 cm/s  Systemic Diam: 2.00 cm MV E/A ratio:  1.10 Vinie Maxcy MD Electronically signed by Vinie Maxcy MD Signature Date/Time: 05/30/2024/11:36:23 AM    Final     Labs:  CBC: Recent Labs    05/24/24 1347 05/29/24 1230 05/29/24 2219  05/30/24 0604  WBC 3.8* 4.4 8.5 23.3*  HGB 8.3* 7.6* 7.0* 8.0*  HCT 25.1* 23.9* 22.2* 24.9*  PLT 103* 111* 81* 99*    COAGS: Recent Labs    06/26/23 2100 07/01/23 1209 05/29/24 1230  INR 1.3* 1.2 1.1  APTT 34  --   --     BMP: Recent Labs    05/24/24 1347 05/29/24 1230 05/29/24 2219 05/30/24 0604  NA 136 137 136 136  K 4.4 3.9 3.8 3.8  CL 104 104 106 105  CO2 22 21* 18* 18*  GLUCOSE 120* 116* 100* 98  BUN 26* 31* 33* 35*  CALCIUM  8.7* 8.6* 7.6* 7.9*  CREATININE 2.04* 2.30* 2.66* 2.85*  GFRNONAA 34* 30* 25* 23*    LIVER FUNCTION TESTS: Recent Labs    05/17/24 1320 05/24/24 1347 05/29/24 2219 05/30/24 0604  BILITOT 0.7 0.7 0.8 1.1  AST 28 27 49* 50*  ALT 15 13 20 21   ALKPHOS 132* 147* 182* 165*  PROT 7.3 7.6 5.8* 6.2*  ALBUMIN  3.6 3.6 2.6* 2.9*    Assessment and Plan: Urosepsis s/p bilateral nephrostomy tube conversion to retrograde nephroureteral tubes 2/4.  Patient with s/s sepsis post-procedure with fevers up to 102, hypotension, and drop in Hgb from 8.3  7.0.  Patient resuscitated with 1u PRBC, LR fluids, remains on pressors with increased requirement today. Now on Zosyn .  His ileal conduit is functional. Bilateral tubes in place with blood tinged urine.  States he typically gets bloody urine for 3-5 days after nephrostomy tube changes.   No acute needs related to IR intervention/conversion.  Appreciate assistance from team for resolve of acute response.  IR remains available as needed.   Electronically Signed: Vignesh Willert Sue-Ellen Laurren Lepkowski, PA 05/30/2024, 1:14 PM   I spent a total of 15 Minutes at the the patient's bedside AND on the patient's hospital floor or unit, greater than 50% of which  was counseling/coordinating care for urosepsis.      "

## 2024-05-30 NOTE — Progress Notes (Signed)
 Luis [name] admitted for outpatient urology procedure, post procedure became hypotensive requiring pressors and is in ICU. Trh will sign off for now.  We will take over once he is off pressors,   Gilda Abboud, MD

## 2024-05-30 NOTE — Plan of Care (Signed)
" °  Problem: Coping: Goal: Ability to adjust to condition or change in health will improve Outcome: Progressing   Problem: Metabolic: Goal: Ability to maintain appropriate glucose levels will improve Outcome: Progressing   Problem: Education: Goal: Knowledge of General Education information will improve Description: Including pain rating scale, medication(s)/side effects and non-pharmacologic comfort measures Outcome: Progressing   Problem: Clinical Measurements: Goal: Cardiovascular complication will be avoided Outcome: Progressing   Problem: Nutritional: Goal: Maintenance of adequate nutrition will improve Outcome: Not Progressing   "

## 2024-05-30 NOTE — Progress Notes (Addendum)
 1500 Pt arrived from IR- report from [place].   VSS but patient was shaking-  IR-RN states he is cold.   Questioned that pt  look look pale and that may have rigor from infection.  Family at bedside states he is pale and has been getting blood transfusions. Warm blankets applied. Will monitor

## 2024-05-31 ENCOUNTER — Inpatient Hospital Stay

## 2024-05-31 ENCOUNTER — Inpatient Hospital Stay: Admitting: Physician Assistant

## 2024-05-31 LAB — CBC WITH DIFFERENTIAL/PLATELET
Abs Immature Granulocytes: 0.53 10*3/uL — ABNORMAL HIGH (ref 0.00–0.07)
Basophils Absolute: 0.1 10*3/uL (ref 0.0–0.1)
Basophils Relative: 0 %
Eosinophils Absolute: 0.2 10*3/uL (ref 0.0–0.5)
Eosinophils Relative: 1 %
HCT: 23.3 % — ABNORMAL LOW (ref 39.0–52.0)
Hemoglobin: 7.6 g/dL — ABNORMAL LOW (ref 13.0–17.0)
Immature Granulocytes: 3 %
Lymphocytes Relative: 3 %
Lymphs Abs: 0.6 10*3/uL — ABNORMAL LOW (ref 0.7–4.0)
MCH: 31.5 pg (ref 26.0–34.0)
MCHC: 32.6 g/dL (ref 30.0–36.0)
MCV: 96.7 fL (ref 80.0–100.0)
Monocytes Absolute: 1 10*3/uL (ref 0.1–1.0)
Monocytes Relative: 6 %
Neutro Abs: 15.3 10*3/uL — ABNORMAL HIGH (ref 1.7–7.7)
Neutrophils Relative %: 87 %
Platelets: 91 10*3/uL — ABNORMAL LOW (ref 150–400)
RBC: 2.41 MIL/uL — ABNORMAL LOW (ref 4.22–5.81)
RDW: 18.9 % — ABNORMAL HIGH (ref 11.5–15.5)
WBC: 17.7 10*3/uL — ABNORMAL HIGH (ref 4.0–10.5)
nRBC: 0 % (ref 0.0–0.2)

## 2024-05-31 LAB — GASTROINTESTINAL PANEL BY PCR, STOOL (REPLACES STOOL CULTURE)

## 2024-05-31 LAB — BASIC METABOLIC PANEL WITH GFR
Anion gap: 11 (ref 5–15)
BUN: 36 mg/dL — ABNORMAL HIGH (ref 8–23)
CO2: 20 mmol/L — ABNORMAL LOW (ref 22–32)
Calcium: 8.2 mg/dL — ABNORMAL LOW (ref 8.9–10.3)
Chloride: 104 mmol/L (ref 98–111)
Creatinine, Ser: 2.73 mg/dL — ABNORMAL HIGH (ref 0.61–1.24)
GFR, Estimated: 24 mL/min — ABNORMAL LOW
Glucose, Bld: 116 mg/dL — ABNORMAL HIGH (ref 70–99)
Potassium: 3.1 mmol/L — ABNORMAL LOW (ref 3.5–5.1)
Sodium: 136 mmol/L (ref 135–145)

## 2024-05-31 LAB — GLUCOSE, CAPILLARY
Glucose-Capillary: 107 mg/dL — ABNORMAL HIGH (ref 70–99)
Glucose-Capillary: 113 mg/dL — ABNORMAL HIGH (ref 70–99)
Glucose-Capillary: 120 mg/dL — ABNORMAL HIGH (ref 70–99)
Glucose-Capillary: 110 mg/dL — ABNORMAL HIGH (ref 70–99)
Glucose-Capillary: 113 mg/dL — ABNORMAL HIGH (ref 70–99)

## 2024-05-31 LAB — CBC
HCT: 23.1 % — ABNORMAL LOW (ref 39.0–52.0)
Hemoglobin: 7.4 g/dL — ABNORMAL LOW (ref 13.0–17.0)
MCH: 31.1 pg (ref 26.0–34.0)
MCHC: 32 g/dL (ref 30.0–36.0)
MCV: 97.1 fL (ref 80.0–100.0)
Platelets: 87 10*3/uL — ABNORMAL LOW (ref 150–400)
RBC: 2.38 MIL/uL — ABNORMAL LOW (ref 4.22–5.81)
RDW: 18.5 % — ABNORMAL HIGH (ref 11.5–15.5)
WBC: 11.2 10*3/uL — ABNORMAL HIGH (ref 4.0–10.5)
nRBC: 0 % (ref 0.0–0.2)

## 2024-05-31 LAB — CULTURE, BLOOD (ROUTINE X 2)
Culture: NO GROWTH
Culture: NO GROWTH

## 2024-05-31 MED ORDER — ALBUMIN HUMAN 25 % IV SOLN
25.0000 g | Freq: Once | INTRAVENOUS | Status: AC
  Administered 2024-05-31: 25 g via INTRAVENOUS
  Filled 2024-05-31: qty 100

## 2024-05-31 MED ORDER — CHLORHEXIDINE GLUCONATE CLOTH 2 % EX PADS: 6.0000 | MEDICATED_PAD | Freq: Every day | CUTANEOUS | Status: AC

## 2024-05-31 MED ORDER — POTASSIUM CHLORIDE CRYS ER 20 MEQ PO TBCR
40.0000 meq | EXTENDED_RELEASE_TABLET | Freq: Once | ORAL | Status: AC
  Administered 2024-05-31: 40 meq via ORAL
  Filled 2024-05-31: qty 2

## 2024-05-31 NOTE — Plan of Care (Signed)
  Problem: Metabolic: Goal: Ability to maintain appropriate glucose levels will improve Outcome: Progressing   Problem: Clinical Measurements: Goal: Respiratory complications will improve Outcome: Progressing Goal: Cardiovascular complication will be avoided Outcome: Progressing

## 2024-05-31 NOTE — TOC Initial Note (Signed)
 Transition of Care [facility]) - Initial/Assessment Note    Patient Details  Name: Luis [name] MRN: [id number] Date of Birth: 1955-12-05  Transition of [facility] CM/SW Contact:    Alfonse JONELLE Rex, RN Phone Number: 05/31/2024, 3:39 PM  Clinical Narrative:    Admitted for scheduled outpatient procedure, hypotensive, fever post procedure.On iv Levophed , iv abx . Resides in a private residence with spouse, has PCP and insurance on file.   CM will continue to follow for dc needs.                    Patient Goals and CMS Choice            Expected Discharge Plan and Services                                              Prior Living Arrangements/Services                       Activities of Daily Living      Permission Sought/Granted                  Emotional Assessment              Admission diagnosis:  Sepsis (HCC) [A41.9] Shock (HCC) [R57.9] Patient Active Problem List   Diagnosis Date Noted   Insomnia 12/06/2023   Medication management 08/01/2023   Iron deficiency anemia due to chronic blood loss 07/11/2023   Gastric ulcer with hemorrhage 07/11/2023   SIRS (systemic inflammatory response syndrome) (HCC) 06/27/2023   Acute on chronic anemia 06/27/2023   Renal dysfunction 06/27/2023   Port-A-Cath in place 05/30/2023   History of bladder cancer 04/18/2023   Sepsis (HCC) 04/18/2023   AKI (acute kidney injury) 04/18/2023   History of aortic valve replacement with tissue graft 04/15/2023   Carrier of multidrug-resistant Stenotrophomonas maltophilia 04/15/2023   Enterococcus faecalis infection 04/14/2023   Bacteremia 04/14/2023   History of prosthetic aortic valve 04/14/2023   Septic shock (HCC) 04/13/2023   Bladder cancer (HCC) 07/29/2022   S/P pericardial window creation 09/30/2021   Pericardial effusion 09/28/2021   Syncope    PAF (paroxysmal atrial fibrillation) (HCC)    S/P AVR 09/16/2021   Plantar fasciitis of left foot  08/19/2021   Acquired cavus deformity of foot 08/19/2021   Morbid obesity (HCC) 02/29/2016   Essential hypertension    Ascending aortic aneurysm (HCC)    Rhinitis, chronic 01/14/2015   Generalized anxiety disorder 01/14/2015   Aortic stenosis, moderate 01/02/2015   Chronic diastolic CHF (congestive heart failure) (HCC) 06/30/2014   OSA (obstructive sleep apnea) 02/13/2012   Fatigue 01/09/2012   Gall bladder disease 06/16/2011   Hyperlipidemia 02/28/2011   Angina of effort 12/08/2010   CAD (coronary artery disease) 12/08/2010   Tobacco abuse 12/08/2010   Aortic stenosis 12/08/2010   ANAL FISSURE 04/23/2009   Internal hemorrhoids 05/22/2008   COLONIC POLYPS 02/07/2008   CAROTID ARTERY STENOSIS 02/07/2008   PCP:  Johnny Garnette LABOR, MD Pharmacy:   [facility] [address], [place] - [address] [place] [address] Phone: [phone] Fax: [phone]     Social Drivers of Health (SDOH) Social History: SDOH Screenings   Food Insecurity: No Food Insecurity (05/30/2024)  Housing: Low Risk (05/30/2024)  Transportation Needs: No Transportation Needs (05/30/2024)  Utilities: Not  At Risk (05/30/2024)  Alcohol Screen: Low Risk (05/09/2023)  Depression (PHQ2-9): Low Risk (05/24/2024)  Financial Resource Strain: Low Risk (02/15/2024)  Physical Activity: Inactive (02/15/2024)  Social Connections: Moderately Isolated (05/30/2024)  Stress: Stress Concern Present (02/15/2024)  Tobacco Use: Medium Risk (05/29/2024)  Health Literacy: Adequate Health Literacy (11/03/2022)   SDOH Interventions:     Readmission Risk Interventions    06/29/2023    1:49 PM  Readmission Risk Prevention Plan  Transportation Screening Complete  Medication Review (RN Care Manager) Complete  PCP or Specialist appointment within 3-5 days of discharge Complete  HRI or Home Care Consult Complete  SW Recovery Care/Counseling Consult Complete  Palliative Care Screening Not Applicable  Skilled  Nursing Facility Not Applicable

## 2024-05-31 NOTE — Plan of Care (Signed)
" °  Problem: Education: Goal: Ability to describe self-care measures that may prevent or decrease complications (Diabetes Survival Skills Education) will improve Outcome: Progressing   Problem: Coping: Goal: Ability to adjust to condition or change in health will improve Outcome: Progressing   Problem: Fluid Volume: Goal: Ability to maintain a balanced intake and output will improve Outcome: Progressing   Problem: Health Behavior/Discharge Planning: Goal: Ability to identify and utilize available resources and services will improve Outcome: Progressing Goal: Ability to manage health-related needs will improve Outcome: Progressing   Problem: Clinical Measurements: Goal: Ability to maintain clinical measurements within normal limits will improve Outcome: Progressing   "

## 2024-05-31 NOTE — Progress Notes (Addendum)
 "  NAME:  Luis [name], MRN:  [id number], DOB:  01/02/1956, LOS: 2 ADMISSION DATE:  05/29/2024, CONSULTATION DATE:  05/29/24 REFERRING MD:  TRH, CHIEF COMPLAINT:  relative hypotension/sepsis post procedural   History of Present Illness:  69 yo male presented for outpt procedure of b/l nephrostomy tubes conversion to nephroureteral catheters (already urostomy in place). Pt is undergoing chemo for invasive bladder cancer with next round in 2 two days. Post procedure pt developed fever to 102 and was found to be hypotensive, responsive to fluids. He was also found to have hemoglobin of 7.6 from baseline of 8.5. Prior to procedure pt was in his normal state of health. He denies any complaints at this time states he is feeling better post operatively now than he was. Immediately post op, c/o dizziness and chills only.   Ccm was consulted with pt's relative hypotension and potential for declining post procedurally, possibly requiring pressors.   Pertinent  Medical History   Past Medical History:  Diagnosis Date   Anxiety    takes Valium  as needed   Aortic stenosis, moderate    Arthritis    Ascending aortic aneurysm    CAD (coronary artery disease)    a. s/p PCI of the RCA 8/12 with DES by Dr Wonda, preserved EF. b. LHC/RHC (2/16) with mean RA 12, PA 32/15, mean PCWP 18, CI 3.47; patent mid and distal RCA stents, 50-60% proximal stenosis small PDA.      Cancer [facility])    bladder   Carotid stenosis    a. Carotid US  (05/2013):  Bilateral 1-39% ICA; L thyroid  nodule (prior hx of aspiration).   Chronic diastolic CHF (congestive heart failure) (HCC)    Complication of anesthesia    difficulty waking up after gallbladder surgery   Depression    Dyspnea    Essential hypertension    GERD (gastroesophageal reflux disease)    if needed will take OTC meds    Heart murmur    History of colonic polyps    hyperplastic   Hyperlipidemia    Joint pain    Lesion of bladder    Myocardial infarction (HCC)  2012   Obesity (BMI 30-39.9) 02/29/2016   Pre-diabetes    Restless leg    Sleep apnea    uses cpap   Tubular adenoma of colon    Vertigo    takes Meclizine  as needed     Significant Hospital Events: Including procedures, antibiotic start and stop dates in addition to other pertinent events   Outpt procedure 2/4 with subsequent concern for sepsis Admitted to progressive 2/4 Ccm consult 2/4 for ongoing hypotension and fever, started on low dose Levo & Cipro  2/5 Patient remains on levophed , abx broaden to vanc and zosyn  from cipro , vanc changed to linezolid    Interim History / Subjective:  On minimal amount of pressors  Overall, patient feels slightly better than yesterday   No significant events overnight   Objective    Blood pressure (!) 135/58, pulse 61, temperature 98.4 F (36.9 C), temperature source Oral, resp. rate 17, height 5' 6 (1.676 m), weight 99.9 kg, SpO2 95%.        Intake/Output Summary (Last 24 hours) at 05/31/2024 0803 Last data filed at 05/31/2024 0700 Gross per 24 hour  Intake 1545.55 ml  Output 2850 ml  Net -1304.45 ml   Filed Weights   05/29/24 2128  Weight: 99.9 kg    Examination: General: acute on chronic older adult male, sitting up in ICU  bed in no distress  HEENT: Normocephalic, PERRLA intact, Pink MM CV: s1,s2, RRR, no MRG, No JVD  pulm: clear, diminished, no distress on RA-  Abs: bs active, soft, urostomy right upper quadrant- stoma pink- urine output pink/hematuric- clearing  Extremities: no edema, no deformity, moves all extremities on command Skin: no rash  Neuro: Rass 0, follows commands GU: urostomy   Resolved problem list   Assessment and Plan  Bladder cancer, s/p cystectomy  Acute hypotension Sepsis Shock suspected 2/2 urologic source B/l nephrostomy Leukocytosis- improving  Patient with hypotension post procedure with new onset fevering and increase in WBC to 23.3 from 8.5. Patient was started on Cipro  for concern for  sepsis secondary to urologic source. Patient has a hx of urosepsis 2/2 Klebsiella, ESBL citrobacter, and E. Facelis last year.  - Switch ABX coverage to Zosyn  & d/c Cipro  given increasing pressor needs & hx of urosepsis. With hx of ESBL, also vanc ordered> zyvox  used instead- low threshold to increase coverage to [facility] if patient status worsens UA showing small leukocytes, many bacteria, UC pending  ECHO showing 65-70%, LV normal function, unable to evaluate regional wall abnormalities, mild left ventricular hypertrophy, RV norma, AVR hx P: Continue to trend BC and urine culture, received 500 cc bolus of LR on 2/6  Patient making adequate urine output via urostomy  Continue to wean levophed  as tolerated, currently on 2mcg of levophed  and have more room to decrease and hopefully wean off, will hold fluid admin at this time- if becomes hypotensive consider albumin  or 1 unit of PRBCs  Continue zyvox  for one more day and shock continues to improve, can consider treating with just zosyn    Acute blood loss anemia  Anemia of Chronic Disease Hematuria Acute on chronic thrombocytopenia Hx Blood transfusion reaction Baseline Hbg ~8-8.6. Patient reports hx of requiring frequent blood transfusions and hx of blood transfusion reaction Patient received 1 unit PRBC post procedure for Hbg of 7 w/ hypotension. Rpt was 8, similar to baseline Hgb on 2/6 7.6 P: H/h stable, transfuse Hbg < 7  Hold iron supplementation while having acute infection  Continue to trend CBC, h/h  Continue to monitor for signs of bleeding- patient has hematuria but beginning to clear via urostomy- stoma- pink   Aki on ckd 3b Baseline Cr ~1.6 - 2.4 Mildly elevated Cr up to 2.85, likely related to decrease renal profusion d/t hypotension AKI- plateau, maybe slowing starting to trend down P: Continue to trend renal function daily  Continue to monitor and optimize electrolytes daily Continue to monitor urine output Continue strict  I/Os Continue Adequate renal perfusion  Avoid nephrotoxic agents  Replace K  Trend and replace electrolytes PRN   Acute Diarrhea Fecal Incontinence  Patient reports new diarrhea/ incontinence with no hx of fecal incontinence or diarrhea, baseline reports being constipated and using stool softeners. Wife states he typically gets diarrhea while in the hospital which leads to suspicion for ABX associated diarrhea. Diarrhea started post procedure and is having episodes x2 per hour  Suspicion for hypovolemia component to hypotension, will replace volume status as needed P: GI and Cdiff panel pending- continue to f/u with results Continue enteric precautions   Mod Ao stenosis s/p valve replacement Hx Diastolic HF Hx hypertension Hx CAD Acute Oxygen Dependence  Hx of diastolic HF and moderate Ao stenosis. Most recent echo in 2024 with normal EF (65%) and normal RV with no Ao stenosis present & edwards valve present.  ECHO showing 65-70%, LV normal function,  unable to evaluate regional wall abnormalities, mild left ventricular hypertrophy, RV norma, AVR hx 2/5 Patient requiring up to 4L post Op w/ no hx of home oxygen use. CXR ordered to assess possible presence of pulmonary edema given DHF Hx P: Wean oxygen as tolerated- currently off  Continue IS, Repeat Echo  Continue to hold norvasc  and carvedilol   Continue ASA  Continue heparin  SBQ for DVT propx   GERD P:  Continue PPI- protonix     Hyperlipidemia P: Continue home crestor , continue   Vertigo P: Continue to hold meclizine  for now in setting of hypotension  Patient not complaining of vertigo at this time   H/o anxiety H/o depression P: Continue effexor - 150mg  PO daily    Prediabetes Hx of pre-diabetes at 5.9%, most recent his visit of 5.1% P: Continue CBG q 4 Continue to monitor needs for SSI, CBGs < 140    Labs   CBC: Recent Labs  Lab 05/24/24 1347 05/29/24 1230 05/29/24 2219 05/30/24 0604 05/30/24 1722  05/31/24 0446  WBC 3.8* 4.4 8.5 23.3*  --  17.7*  NEUTROABS 2.7 3.3 8.0*  --   --  15.3*  HGB 8.3* 7.6* 7.0* 8.0* 7.9* 7.6*  HCT 25.1* 23.9* 22.2* 24.9* 24.4* 23.3*  MCV 93.7 98.0 98.2 97.3  --  96.7  PLT 103* 111* 81* 99*  --  91*    Basic Metabolic Panel: Recent Labs  Lab 05/24/24 1347 05/29/24 1230 05/29/24 2219 05/30/24 0604 05/30/24 1722 05/31/24 0446  NA 136 137 136 136  --  136  K 4.4 3.9 3.8 3.8  --  3.1*  CL 104 104 106 105  --  104  CO2 22 21* 18* 18*  --  20*  GLUCOSE 120* 116* 100* 98  --  116*  BUN 26* 31* 33* 35*  --  36*  CREATININE 2.04* 2.30* 2.66* 2.85*  --  2.73*  CALCIUM  8.7* 8.6* 7.6* 7.9*  --  8.2*  MG  --   --   --  1.4* 2.0  --   PHOS  --   --   --  3.9  --   --    GFR: Estimated Creatinine Clearance: 27.8 mL/min (A) (by C-G formula based on SCr of 2.73 mg/dL (H)). Recent Labs  Lab 05/29/24 1230 05/29/24 2219 05/30/24 0604 05/31/24 0446  WBC 4.4 8.5 23.3* 17.7*    Liver Function Tests: Recent Labs  Lab 05/24/24 1347 05/29/24 2219 05/30/24 0604  AST 27 49* 50*  ALT 13 20 21   ALKPHOS 147* 182* 165*  BILITOT 0.7 0.8 1.1  PROT 7.6 5.8* 6.2*  ALBUMIN  3.6 2.6* 2.9*   No results for input(s): LIPASE, AMYLASE in the last 168 hours. No results for input(s): AMMONIA in the last 168 hours.  ABG    Component Value Date/Time   PHART 7.323 (L) 03/03/2023 1144   PCO2ART 43.9 03/03/2023 1144   PO2ART 143 (H) 03/03/2023 1144   HCO3 24.6 06/15/2023 0929   HCO3 24.2 06/15/2023 0929   TCO2 21 (L) 06/26/2023 2054   ACIDBASEDEF 1.0 06/15/2023 0929   ACIDBASEDEF 1.0 06/15/2023 0929   O2SAT 57 06/15/2023 0929   O2SAT 61 06/15/2023 0929     Coagulation Profile: Recent Labs  Lab 05/29/24 1230  INR 1.1    Cardiac Enzymes: No results for input(s): CKTOTAL, CKMB, CKMBINDEX, TROPONINI in the last 168 hours.  HbA1C: Hgb A1c MFr Bld  Date/Time Value Ref Range Status  05/29/2024 10:19 PM 5.1 4.8 - 5.6 %  Final    Comment:     (NOTE) Diagnosis of Diabetes The following HbA1c ranges recommended by the American Diabetes Association (ADA) may be used as an aid in the diagnosis of diabetes mellitus.  Hemoglobin             Suggested A1C NGSP%              Diagnosis  <5.7                   Non Diabetic  5.7-6.4                Pre-Diabetic  >6.4                   Diabetic  <7.0                   Glycemic control for                       adults with diabetes.    05/30/2022 12:52 PM 5.9 (H) 4.8 - 5.6 % Final    Comment:    (NOTE) Pre diabetes:          5.7%-6.4%  Diabetes:              >6.4%  Glycemic control for   <7.0% adults with diabetes     CBG: Recent Labs  Lab 05/30/24 0002 05/30/24 0516 05/30/24 0727 05/30/24 1136 05/30/24 1522  GLUCAP 94 101* 89 112* 135*  Home Medications  Prior to Admission medications  Medication Sig Start Date End Date Taking? Authorizing Provider  acetaminophen  (TYLENOL ) 325 MG tablet Take 650 mg by mouth every 6 (six) hours as needed (for pain).    [provider]  amLODipine  (NORVASC ) 5 MG tablet TAKE 1 TABLET (5 MG TOTAL) BY MOUTH DAILY. 04/22/24   Neomi Johnston DASEN, PA-C  aspirin  EC 81 MG tablet Take 81 mg by mouth in the morning.    [provider]  carvedilol  (COREG ) 3.125 MG tablet TAKE 1 TABLET BY MOUTH TWICE A DAY WITH FOOD 02/26/24   Rolan Ezra RAMAN, MD  cyanocobalamin  (VITAMIN B12) 1000 MCG tablet Take 1 tablet (1,000 mcg total) by mouth daily. 06/15/23   Dorsey, John T IV, MD  diazepam  (VALIUM ) 5 MG tablet TAKE 1 TABLET BY MOUTH EVERY 12 HOURS AS NEEDED FOR ANXIETY. 03/29/23   Johnny Garnette LABOR, MD  famotidine  (PEPCID ) 20 MG tablet Take 1 tablet (20 mg total) by mouth 2 (two) times daily for 6 days. 08/15/23 09/14/24  Walisiewicz, Kaitlyn E, PA-C  ferrous sulfate  325 (65 FE) MG tablet Take 1 tablet (325 mg total) by mouth daily with breakfast. Please take with a source of Vitamin C 09/23/23   Dorsey, John T IV, MD  furosemide  (LASIX ) 20 MG  tablet TAKE 3 TABLETS BY MOUTH EVERY MORNING AND TAKE 2 TABLETS BY MOUTH EVERY EVENING 01/12/24   McLean, Dalton S, MD  ipratropium (ATROVENT ) 0.03 % nasal spray Place 2 sprays into both nostrils every 12 (twelve) hours as needed for rhinitis.    [provider]  lidocaine -prilocaine  (EMLA ) cream Apply topically once a week. 11/29/23   Federico Norleen DASEN MADISON, MD  Magnesium  500 MG TABS Take 500 mg by mouth every morning.    [provider]  meclizine  (ANTIVERT ) 25 MG tablet Take 1 tablet (25 mg total) by mouth 3 (three) times daily as needed for dizziness. 09/09/22   Johnny Garnette LABOR,  MD  mirtazapine  (REMERON ) 7.5 MG tablet TAKE 1 TABLET BY MOUTH AT BEDTIME. 02/16/24   Boscia, Heather E, NP  multivitamin-iron-minerals-folic acid  (CENTRUM) chewable tablet Chew 1 tablet by mouth daily. 09/23/21   Barrett, Erin R, PA-C  nitroGLYCERIN  (NITROSTAT ) 0.4 MG SL tablet Place 0.4 mg under the tongue every 5 (five) minutes as needed for chest pain. 11/03/17 12/05/25  [provider]  ondansetron  (ZOFRAN ) 8 MG tablet Take 1 tablet (8 mg total) by mouth every 8 (eight) hours as needed. 05/16/23   Federico Norleen ONEIDA MADISON, MD  pantoprazole  (PROTONIX ) 40 MG tablet TAKE 1 TABLET (40 MG TOTAL) BY MOUTH TWICE A DAY BEFORE MEALS 02/12/24   Johnny Garnette LABOR, MD  potassium chloride  SA (KLOR-CON  M) 20 MEQ tablet Take 1 tablet (20 mEq total) by mouth 2 (two) times daily. 05/10/24   Milford, Harlene HERO, FNP  prochlorperazine  (COMPAZINE ) 10 MG tablet Take 1 tablet (10 mg total) by mouth every 6 (six) hours as needed for nausea or vomiting. 05/16/23   Federico Norleen ONEIDA MADISON, MD  rosuvastatin  (CRESTOR ) 20 MG tablet Take 1 tablet (20 mg total) by mouth daily. 08/15/23   Johnny Garnette LABOR, MD  temazepam  (RESTORIL ) 30 MG capsule Take 1 capsule (30 mg total) by mouth at bedtime as needed for sleep. 12/06/23   Johnny Garnette LABOR, MD  traMADol  (ULTRAM ) 50 MG tablet Take 1 tablet (50 mg total) by mouth every 6 (six) hours as needed. 08/09/23   Thayil,  Irene T, PA-C  venlafaxine  XR (EFFEXOR -XR) 150 MG 24 hr capsule Take 1 capsule (150 mg total) by mouth daily with breakfast. 12/06/23   Johnny Garnette LABOR, MD    The patient is critically ill with multiple organ system failure and requires high complexity decision making for assessment and support, frequent evaluation and titration of therapies, advanced monitoring, review of radiographic studies and interpretation of complex data.    Critical Care Time devoted to patient care services, exclusive of separately billable procedures, described in this note is 40 mins   Christian Smith AGACNP-BC    Pulmonary & Critical Care 05/31/2024, 8:54 AM  Please see [email or web address] for pager details.  From 7A-7P if no response, please call [phone]. After hours, please call ELink [phone].    Critical care attending attestation note:  Patient seen and examined and relevant ancillary tests reviewed.  I agree with the assessment and plan of care as outlined by Sherlean Sharps, NP.   69 year old with complicated urologic history with active treatment of invasive bladder cancer admitted to the ICU with fever and hypotension after manipulation of urologic drainage in IR.  Gradually improving.  Minimal pressor requirement.  But ongoing pressor requirement to maintain adequate perfusion.  Chest x-ray looks relatively clear, maybe mild edema.  RA pressure estimated 8 yesterday.  Echo overall looks reassuring.  Additional volume resuscitation with albumin  today.  Consider small amounts of crystalloid as well.  Approaching adequate resuscitation.  Synopsis of assessment and plan:  Septic shock due to urologic source: After bilateral nephrostomy tube placement. UA with pyuria and positive leukocytes. -- Continue broad-spectrum antibiotics, follow-up cultures, tailor antibiotics based on results -- Continue vasopressors, active titration of IV vasoactive medications to achieve MAP goal greater than 65 --  Cautious fluid resuscitation with albumin , consider small amount of crystalloid in the coming hours if needed  Acute kidney injury on CKD 3B: Creatinine hopefully plateauing.  Marked increase in urine output overnight, likely in setting of ATN related to septic shock and  post ATN diuresis -- Avoid nephrotoxic agents  Diarrhea: Likely related to antibiotics, frequent complication of treatment with antibiotics in the past per family -- Follow-up C. difficile, GI pathogen panel  Hypoxemia: Resolved.  Mild bilateral chest x-ray considered likely source versus alteration with sepsis. -- Now on room air  History of diastolic dysfunction: Recent echo relatively reassuring. -- Now auto diuresing, consider Lasix  if needed in the future  CRITICAL CARE Performed by: Donnice JONELLE Beals   Total critical care time: 45 minutes  Critical care time was exclusive of separately billable procedures and treating other patients.  Critical care was necessary to treat or prevent imminent or life-threatening deterioration.    Critical care was time spent personally by me on the following activities: development of treatment plan with patient and/or surrogate as well as nursing, discussions with consultants, evaluation of patient's response to treatment, examination of patient, obtaining history from patient or surrogate, ordering and performing treatments and interventions, ordering and review of laboratory studies, ordering and review of radiographic studies, pulse oximetry, re-evaluation of patient's condition and participation in multidisciplinary rounds.  Donnice Beals, MD  ICU Physician [facility] Critical Care and Pulmonary Medicine  See Amion for pager info  05/31/2024, 10:03 AM         "

## 2024-06-14 ENCOUNTER — Inpatient Hospital Stay

## 2024-06-14 ENCOUNTER — Inpatient Hospital Stay: Attending: Hematology and Oncology | Admitting: Hematology and Oncology

## 2024-06-21 ENCOUNTER — Inpatient Hospital Stay

## 2024-06-28 ENCOUNTER — Inpatient Hospital Stay: Attending: Hematology and Oncology | Admitting: Hematology and Oncology

## 2024-06-28 ENCOUNTER — Inpatient Hospital Stay

## 2024-06-28 ENCOUNTER — Inpatient Hospital Stay: Admitting: Dietician

## 2024-07-11 ENCOUNTER — Inpatient Hospital Stay: Admitting: Hematology and Oncology

## 2024-07-11 ENCOUNTER — Inpatient Hospital Stay

## 2024-07-18 ENCOUNTER — Inpatient Hospital Stay

## 2024-07-24 ENCOUNTER — Ambulatory Visit (HOSPITAL_COMMUNITY)

## 2024-07-24 ENCOUNTER — Other Ambulatory Visit (HOSPITAL_COMMUNITY)

## 2024-07-25 ENCOUNTER — Inpatient Hospital Stay: Attending: Hematology and Oncology

## 2024-07-25 ENCOUNTER — Inpatient Hospital Stay: Admitting: Hematology and Oncology

## 2024-07-25 ENCOUNTER — Inpatient Hospital Stay

## 2024-11-11 ENCOUNTER — Ambulatory Visit (HOSPITAL_COMMUNITY)
